# Patient Record
Sex: Female | Born: 1940
Health system: Southern US, Community
[De-identification: ages and names within clinical notes are randomized; demographics above are authoritative.]

## PROBLEM LIST (undated history)

## (undated) DIAGNOSIS — R911 Solitary pulmonary nodule: Secondary | ICD-10-CM

## (undated) DIAGNOSIS — M6520 Calcific tendinitis, unspecified site: Secondary | ICD-10-CM

## (undated) DIAGNOSIS — Z950 Presence of cardiac pacemaker: Secondary | ICD-10-CM

## (undated) DIAGNOSIS — Z95810 Presence of automatic (implantable) cardiac defibrillator: Secondary | ICD-10-CM

## (undated) DIAGNOSIS — I493 Ventricular premature depolarization: Secondary | ICD-10-CM

## (undated) DIAGNOSIS — I10 Essential (primary) hypertension: Secondary | ICD-10-CM

## (undated) DIAGNOSIS — I429 Cardiomyopathy, unspecified: Secondary | ICD-10-CM

## (undated) DIAGNOSIS — K449 Diaphragmatic hernia without obstruction or gangrene: Secondary | ICD-10-CM

## (undated) DIAGNOSIS — E785 Hyperlipidemia, unspecified: Secondary | ICD-10-CM

## (undated) DIAGNOSIS — M722 Plantar fascial fibromatosis: Secondary | ICD-10-CM

## (undated) DIAGNOSIS — E669 Obesity, unspecified: Secondary | ICD-10-CM

## (undated) DIAGNOSIS — G5600 Carpal tunnel syndrome, unspecified upper limb: Secondary | ICD-10-CM

## (undated) DIAGNOSIS — I5042 Chronic combined systolic (congestive) and diastolic (congestive) heart failure: Secondary | ICD-10-CM

## (undated) DIAGNOSIS — I2510 Atherosclerotic heart disease of native coronary artery without angina pectoris: Secondary | ICD-10-CM

## (undated) DIAGNOSIS — Q809 Congenital ichthyosis, unspecified: Secondary | ICD-10-CM

## (undated) DIAGNOSIS — I499 Cardiac arrhythmia, unspecified: Secondary | ICD-10-CM

## (undated) DIAGNOSIS — Z973 Presence of spectacles and contact lenses: Secondary | ICD-10-CM

## (undated) DIAGNOSIS — G4730 Sleep apnea, unspecified: Secondary | ICD-10-CM

## (undated) HISTORY — DX: Diaphragmatic hernia without obstruction or gangrene: K44.9

## (undated) HISTORY — DX: Chronic combined systolic (congestive) and diastolic (congestive) heart failure: I50.42

## (undated) HISTORY — DX: Ventricular premature depolarization: I49.3

## (undated) HISTORY — DX: Congenital ichthyosis, unspecified: Q80.9

## (undated) HISTORY — DX: Hyperlipidemia, unspecified: E78.5

## (undated) HISTORY — DX: Atherosclerotic heart disease of native coronary artery without angina pectoris: I25.10

## (undated) HISTORY — DX: Solitary pulmonary nodule: R91.1

## (undated) HISTORY — DX: Calcific tendinitis, unspecified site: M65.20

## (undated) HISTORY — PX: COLONOSCOPY: SHX174

## (undated) HISTORY — DX: Plantar fascial fibromatosis: M72.2

## (undated) HISTORY — PX: ROTATOR CUFF REPAIR: SHX139

## (undated) HISTORY — DX: Essential (primary) hypertension: I10

## (undated) HISTORY — DX: Carpal tunnel syndrome, unspecified upper limb: G56.00

## (undated) HISTORY — DX: Obesity, unspecified: E66.9

## (undated) HISTORY — PX: TRANSTHORACIC ECHOCARDIOGRAM: SHX275

---

## 1966-12-12 HISTORY — PX: OTHER SURGICAL HISTORY: SHX169

## 1970-12-12 HISTORY — PX: ABSCESS DRAINAGE: SHX1119

## 1973-12-12 HISTORY — PX: ABDOMINAL HYSTERECTOMY: SHX81

## 1999-12-02 ENCOUNTER — Other Ambulatory Visit: Admission: RE | Admit: 1999-12-02 | Discharge: 1999-12-02 | Payer: Self-pay | Admitting: Obstetrics and Gynecology

## 2000-04-26 ENCOUNTER — Encounter: Payer: Self-pay | Admitting: Family Medicine

## 2000-04-26 ENCOUNTER — Encounter: Admission: RE | Admit: 2000-04-26 | Discharge: 2000-04-26 | Payer: Self-pay | Admitting: Family Medicine

## 2000-12-01 ENCOUNTER — Other Ambulatory Visit: Admission: RE | Admit: 2000-12-01 | Discharge: 2000-12-01 | Payer: Self-pay | Admitting: Obstetrics and Gynecology

## 2001-05-07 ENCOUNTER — Encounter: Payer: Self-pay | Admitting: Family Medicine

## 2001-05-07 ENCOUNTER — Encounter: Admission: RE | Admit: 2001-05-07 | Discharge: 2001-05-07 | Payer: Self-pay | Admitting: Family Medicine

## 2002-01-08 ENCOUNTER — Other Ambulatory Visit: Admission: RE | Admit: 2002-01-08 | Discharge: 2002-01-08 | Payer: Self-pay | Admitting: Obstetrics and Gynecology

## 2002-06-07 ENCOUNTER — Encounter: Payer: Self-pay | Admitting: Family Medicine

## 2002-06-07 ENCOUNTER — Encounter: Admission: RE | Admit: 2002-06-07 | Discharge: 2002-06-07 | Payer: Self-pay | Admitting: Family Medicine

## 2003-06-20 ENCOUNTER — Ambulatory Visit (HOSPITAL_COMMUNITY): Admission: RE | Admit: 2003-06-20 | Discharge: 2003-06-20 | Payer: Self-pay | Admitting: *Deleted

## 2003-07-30 ENCOUNTER — Encounter: Admission: RE | Admit: 2003-07-30 | Discharge: 2003-07-30 | Payer: Self-pay | Admitting: Family Medicine

## 2003-07-30 ENCOUNTER — Encounter: Payer: Self-pay | Admitting: Family Medicine

## 2004-04-27 ENCOUNTER — Other Ambulatory Visit: Admission: RE | Admit: 2004-04-27 | Discharge: 2004-04-27 | Payer: Self-pay | Admitting: Family Medicine

## 2004-06-08 ENCOUNTER — Encounter: Admission: RE | Admit: 2004-06-08 | Discharge: 2004-09-06 | Payer: Self-pay | Admitting: Family Medicine

## 2004-10-01 ENCOUNTER — Encounter: Admission: RE | Admit: 2004-10-01 | Discharge: 2004-10-01 | Payer: Self-pay | Admitting: Family Medicine

## 2004-11-02 ENCOUNTER — Encounter: Admission: RE | Admit: 2004-11-02 | Discharge: 2004-11-02 | Payer: Self-pay | Admitting: Family Medicine

## 2005-05-04 ENCOUNTER — Emergency Department (HOSPITAL_COMMUNITY): Admission: EM | Admit: 2005-05-04 | Discharge: 2005-05-04 | Payer: Self-pay | Admitting: Emergency Medicine

## 2005-05-26 ENCOUNTER — Other Ambulatory Visit: Admission: RE | Admit: 2005-05-26 | Discharge: 2005-05-26 | Payer: Self-pay | Admitting: Family Medicine

## 2005-11-10 ENCOUNTER — Encounter: Admission: RE | Admit: 2005-11-10 | Discharge: 2005-11-10 | Payer: Self-pay | Admitting: Family Medicine

## 2006-06-27 ENCOUNTER — Encounter: Admission: RE | Admit: 2006-06-27 | Discharge: 2006-08-03 | Payer: Self-pay | Admitting: Family Medicine

## 2006-11-13 ENCOUNTER — Encounter: Admission: RE | Admit: 2006-11-13 | Discharge: 2006-11-13 | Payer: Self-pay | Admitting: Family Medicine

## 2006-12-12 DIAGNOSIS — M6520 Calcific tendinitis, unspecified site: Secondary | ICD-10-CM

## 2006-12-12 HISTORY — DX: Calcific tendinitis, unspecified site: M65.20

## 2006-12-14 ENCOUNTER — Ambulatory Visit (HOSPITAL_BASED_OUTPATIENT_CLINIC_OR_DEPARTMENT_OTHER): Admission: RE | Admit: 2006-12-14 | Discharge: 2006-12-15 | Payer: Self-pay | Admitting: Orthopedic Surgery

## 2007-03-09 ENCOUNTER — Encounter: Payer: Self-pay | Admitting: Family Medicine

## 2007-03-09 LAB — CONVERTED CEMR LAB
AST: 23 units/L
Alkaline Phosphatase: 63 units/L
BUN: 16 mg/dL
GFR calc Af Amer: 83.72 mL/min
GFR calc non Af Amer: 101.3 mL/min
Glucose, Bld: 100 mg/dL
Total Protein: 6.8 g/dL

## 2007-06-14 ENCOUNTER — Encounter: Payer: Self-pay | Admitting: Family Medicine

## 2007-06-14 ENCOUNTER — Other Ambulatory Visit: Admission: RE | Admit: 2007-06-14 | Discharge: 2007-06-14 | Payer: Self-pay | Admitting: Family Medicine

## 2007-06-14 LAB — CONVERTED CEMR LAB
AST: 28 units/L
Alkaline Phosphatase: 62 units/L
BUN: 8 mg/dL
Basophils Absolute: 0.1 10*3/uL
Creatinine, Ser: 0.7 mg/dL
Eosinophils Absolute: 0.2 10*3/uL
Glucose, Bld: 85 mg/dL
HCT: 41.7 %
HDL: 50 mg/dL
Hemoglobin: 14.1 g/dL
Hgb A1c MFr Bld: 5.7 %
Lymphocytes Relative: 32.5 %
Lymphs Abs: 2.5 10*3/uL
Neutrophils Relative %: 55.5 %
Platelets: 400 10*3/uL
RDW: 13.6 %
Total Bilirubin: 0.7 mg/dL
Total CHOL/HDL Ratio: 2.28
WBC: 7.7 10*3/uL

## 2007-09-10 ENCOUNTER — Encounter: Payer: Self-pay | Admitting: Family Medicine

## 2007-09-10 LAB — CONVERTED CEMR LAB
ALT: 18 units/L
AST: 22 units/L
Albumin: 3.5 g/dL
Alkaline Phosphatase: 54 units/L
BUN: 11 mg/dL
GFR calc non Af Amer: 86.57 mL/min
Total Bilirubin: 0.7 mg/dL
Total Protein: 6.2 g/dL

## 2007-12-04 ENCOUNTER — Encounter: Admission: RE | Admit: 2007-12-04 | Discharge: 2007-12-04 | Payer: Self-pay | Admitting: Family Medicine

## 2007-12-17 ENCOUNTER — Encounter: Payer: Self-pay | Admitting: Family Medicine

## 2007-12-17 LAB — CONVERTED CEMR LAB
Albumin: 3.6 g/dL
Alkaline Phosphatase: 60 units/L
BUN: 10 mg/dL
CO2: 28 meq/L
Chloride: 104 meq/L
GFR calc Af Amer: 83.47 mL/min
Glucose, Bld: 93 mg/dL
Hgb A1c MFr Bld: 5.8 %
Potassium: 5.8 meq/L

## 2008-03-19 ENCOUNTER — Encounter: Payer: Self-pay | Admitting: Family Medicine

## 2008-03-19 LAB — CONVERTED CEMR LAB
AST: 28 units/L
Anion Gap: 12.4
BUN: 11 mg/dL
Chloride: 105 meq/L
Creatinine, Ser: 0.8 mg/dL
GFR calc non Af Amer: 86.57 mL/min
Sodium: 143 meq/L
Total Bilirubin: 0.8 mg/dL

## 2008-07-24 ENCOUNTER — Encounter: Payer: Self-pay | Admitting: Family Medicine

## 2008-07-24 LAB — CONVERTED CEMR LAB
Albumin: 3.8 g/dL
Basophils Absolute: 0.1 10*3/uL
CO2: 29 meq/L
Cholesterol: 118 mg/dL
Creatinine, Ser: 0.8 mg/dL
Eosinophils Absolute: 0.2 10*3/uL
Eosinophils Relative: 3.7 %
Glucose, Bld: 104 mg/dL
HCT: 41.3 %
Hemoglobin: 13.9 g/dL
Lymphs Abs: 1.8 10*3/uL
MCHC: 33.7 g/dL
Monocytes Absolute: 0.4 10*3/uL
RBC: 4.55 M/uL
Sodium: 143 meq/L
TSH: 0.31 microintl units/mL
Total CHOL/HDL Ratio: 2.51
Total Protein: 7.1 g/dL
WBC: 4.6 10*3/uL

## 2008-10-30 ENCOUNTER — Encounter: Payer: Self-pay | Admitting: Family Medicine

## 2008-10-30 LAB — CONVERTED CEMR LAB
ALT: 15 units/L
Albumin: 4.1 g/dL
Alkaline Phosphatase: 62 units/L
BUN: 12 mg/dL
Calcium: 10.1 mg/dL
Hemoglobin: 13.5 g/dL

## 2008-12-03 ENCOUNTER — Encounter: Payer: Self-pay | Admitting: Family Medicine

## 2008-12-03 ENCOUNTER — Emergency Department (HOSPITAL_COMMUNITY): Admission: EM | Admit: 2008-12-03 | Discharge: 2008-12-03 | Payer: Self-pay | Admitting: Emergency Medicine

## 2008-12-03 LAB — CONVERTED CEMR LAB
CO2: 27 meq/L
Eosinophils Absolute: 0.2 10*3/uL
Eosinophils Relative: 3 %
HCT: 40.2 %
Hemoglobin: 13.4 g/dL
Lymphocytes Relative: 34 %
Lymphs Abs: 2 10*3/uL
MCHC: 33.4 g/dL
Monocytes Relative: 6 %
Platelets: 399 10*3/uL
RBC: 4.54 M/uL
RDW: 14.7 %
Sodium: 143 meq/L
WBC: 5.9 10*3/uL

## 2008-12-26 ENCOUNTER — Encounter: Admission: RE | Admit: 2008-12-26 | Discharge: 2008-12-26 | Payer: Self-pay | Admitting: Family Medicine

## 2009-01-14 ENCOUNTER — Encounter (INDEPENDENT_AMBULATORY_CARE_PROVIDER_SITE_OTHER): Payer: Self-pay | Admitting: Family Medicine

## 2009-01-14 ENCOUNTER — Encounter: Admission: RE | Admit: 2009-01-14 | Discharge: 2009-01-14 | Payer: Self-pay | Admitting: Family Medicine

## 2009-01-14 HISTORY — PX: BREAST BIOPSY: SHX20

## 2009-02-06 ENCOUNTER — Encounter: Payer: Self-pay | Admitting: Family Medicine

## 2009-02-06 LAB — CONVERTED CEMR LAB
AST: 23 units/L
Albumin: 3.9 g/dL
Alkaline Phosphatase: 63 units/L
CO2: 31 meq/L
Calcium: 10 mg/dL
Cholesterol: 110 mg/dL
Creatinine, Ser: 0.9 mg/dL
Direct LDL: 49 mg/dL
GFR calc non Af Amer: 62.27 mL/min
Sodium: 140 meq/L
Total Bilirubin: 0.5 mg/dL
Total Protein: 6.7 g/dL
Triglycerides: 58 mg/dL

## 2009-08-10 ENCOUNTER — Encounter: Payer: Self-pay | Admitting: Family Medicine

## 2009-08-10 LAB — CONVERTED CEMR LAB
ALT: 22 units/L
AST: 27 units/L
BUN: 10 mg/dL
Chloride: 105 meq/L
Cholesterol: 116 mg/dL
GFR calc Af Amer: 75.34 mL/min
GFR calc non Af Amer: 62.27 mL/min
Glucose, Bld: 82 mg/dL
HCT: 41.7 %
HDL: 45 mg/dL
Hemoglobin: 14 g/dL
Hgb A1c MFr Bld: 6 %
Lymphocytes Relative: 40.2 %
Monocytes Relative: 7.4 %
Neutro Abs: 2.6 10*3/uL
Neutrophils Relative %: 48.7 %
Potassium: 5.3 meq/L
RDW: 13.5 %
Sodium: 143 meq/L
TSH: 0.51 microintl units/mL
WBC: 5.3 10*3/uL

## 2009-09-01 ENCOUNTER — Encounter: Payer: Self-pay | Admitting: Family Medicine

## 2009-09-01 ENCOUNTER — Encounter: Admission: RE | Admit: 2009-09-01 | Discharge: 2009-09-01 | Payer: Self-pay | Admitting: Family Medicine

## 2009-11-20 ENCOUNTER — Encounter: Payer: Self-pay | Admitting: Family Medicine

## 2009-11-20 LAB — CONVERTED CEMR LAB
ALT: 19 units/L
Alkaline Phosphatase: 65 units/L
Anion Gap: 12.2
BUN: 21 mg/dL
CO2: 29 meq/L
Calcium: 10.5 mg/dL
GFR calc Af Amer: 86.05 mL/min
Glucose, Bld: 110 mg/dL

## 2010-02-09 ENCOUNTER — Encounter: Payer: Self-pay | Admitting: Family Medicine

## 2010-02-09 LAB — CONVERTED CEMR LAB
AST: 19 units/L
Albumin: 3.8 g/dL
Alkaline Phosphatase: 57 units/L
Chloride: 104 meq/L
Cholesterol: 201 mg/dL
GFR calc Af Amer: 86.05 mL/min
GFR calc non Af Amer: 71.12 mL/min
Hgb A1c MFr Bld: 6.3 %
LDL Cholesterol: 118 mg/dL
Total Bilirubin: 0.5 mg/dL
Total CHOL/HDL Ratio: 4.37
Triglycerides: 101 mg/dL

## 2010-05-19 DIAGNOSIS — Q809 Congenital ichthyosis, unspecified: Secondary | ICD-10-CM | POA: Insufficient documentation

## 2010-05-19 DIAGNOSIS — K449 Diaphragmatic hernia without obstruction or gangrene: Secondary | ICD-10-CM | POA: Insufficient documentation

## 2010-05-19 DIAGNOSIS — E119 Type 2 diabetes mellitus without complications: Secondary | ICD-10-CM

## 2010-05-19 DIAGNOSIS — Z6830 Body mass index (BMI) 30.0-30.9, adult: Secondary | ICD-10-CM | POA: Insufficient documentation

## 2010-05-19 DIAGNOSIS — E785 Hyperlipidemia, unspecified: Secondary | ICD-10-CM

## 2010-05-19 DIAGNOSIS — Q447 Other congenital malformations of liver: Secondary | ICD-10-CM

## 2010-05-19 DIAGNOSIS — Q441 Other congenital malformations of gallbladder: Secondary | ICD-10-CM | POA: Insufficient documentation

## 2010-05-19 DIAGNOSIS — Q445 Other congenital malformations of bile ducts: Secondary | ICD-10-CM

## 2010-05-19 DIAGNOSIS — E1151 Type 2 diabetes mellitus with diabetic peripheral angiopathy without gangrene: Secondary | ICD-10-CM | POA: Insufficient documentation

## 2010-05-19 DIAGNOSIS — E1169 Type 2 diabetes mellitus with other specified complication: Secondary | ICD-10-CM | POA: Insufficient documentation

## 2010-05-19 DIAGNOSIS — J438 Other emphysema: Secondary | ICD-10-CM

## 2010-05-19 DIAGNOSIS — Q4479 Other congenital malformations of liver: Secondary | ICD-10-CM | POA: Insufficient documentation

## 2010-05-19 DIAGNOSIS — J209 Acute bronchitis, unspecified: Secondary | ICD-10-CM | POA: Insufficient documentation

## 2010-05-19 DIAGNOSIS — I10 Essential (primary) hypertension: Secondary | ICD-10-CM

## 2010-05-25 ENCOUNTER — Encounter: Payer: Self-pay | Admitting: Family Medicine

## 2010-06-07 DIAGNOSIS — J984 Other disorders of lung: Secondary | ICD-10-CM

## 2010-06-22 ENCOUNTER — Ambulatory Visit: Payer: Self-pay | Admitting: Family Medicine

## 2010-06-23 LAB — CONVERTED CEMR LAB
AST: 19 units/L (ref 0–37)
BUN: 11 mg/dL (ref 6–23)
Bilirubin, Direct: 0.1 mg/dL (ref 0.0–0.3)
CO2: 29 meq/L (ref 19–32)
Calcium: 9.6 mg/dL (ref 8.4–10.5)
Creatinine, Ser: 0.7 mg/dL (ref 0.4–1.2)
GFR calc non Af Amer: 99.83 mL/min (ref >60)
Glucose, Bld: 88 mg/dL (ref 70–99)
HDL: 40.8 mg/dL (ref >39.00)
Hgb A1c MFr Bld: 6 % (ref 4.6–6.5)
Total Bilirubin: 0.6 mg/dL (ref 0.3–1.2)
Total Protein: 6.5 g/dL (ref 6.0–8.3)

## 2010-07-01 ENCOUNTER — Telehealth: Payer: Self-pay | Admitting: Family Medicine

## 2010-09-07 ENCOUNTER — Telehealth: Payer: Self-pay | Admitting: Family Medicine

## 2010-10-06 ENCOUNTER — Ambulatory Visit: Payer: Self-pay | Admitting: Family Medicine

## 2010-10-08 ENCOUNTER — Ambulatory Visit: Payer: Self-pay | Admitting: Family Medicine

## 2010-10-14 ENCOUNTER — Encounter (INDEPENDENT_AMBULATORY_CARE_PROVIDER_SITE_OTHER): Payer: Self-pay | Admitting: *Deleted

## 2010-10-18 ENCOUNTER — Encounter: Payer: Self-pay | Admitting: Family Medicine

## 2010-10-18 LAB — CONVERTED CEMR LAB
Cholesterol: 197 mg/dL
HDL: 48.7 mg/dL
Hgb A1c MFr Bld: 6.1 %
LDL Cholesterol: 133 mg/dL — ABNORMAL HIGH
Total CHOL/HDL Ratio: 4
Triglycerides: 76 mg/dL
VLDL: 15.2 mg/dL

## 2010-10-19 ENCOUNTER — Telehealth: Payer: Self-pay | Admitting: Family Medicine

## 2010-10-21 ENCOUNTER — Telehealth: Payer: Self-pay | Admitting: Family Medicine

## 2010-10-25 ENCOUNTER — Encounter: Payer: Self-pay | Admitting: Family Medicine

## 2010-10-25 ENCOUNTER — Telehealth: Payer: Self-pay | Admitting: Family Medicine

## 2010-11-08 ENCOUNTER — Telehealth: Payer: Self-pay | Admitting: Family Medicine

## 2010-11-09 ENCOUNTER — Ambulatory Visit: Payer: Self-pay | Admitting: Family Medicine

## 2010-11-09 DIAGNOSIS — M25569 Pain in unspecified knee: Secondary | ICD-10-CM | POA: Insufficient documentation

## 2011-01-13 NOTE — Assessment & Plan Note (Signed)
Summary: NEW FROM [facility]   Vital Signs:  Patient profile:   70 year old female Height:      63 inches Weight:      186.50 pounds BMI:     33.16 Temp:     98.6 degrees F oral Pulse rate:   84 / minute Pulse rhythm:   regular BP sitting:   136 / 84  (left arm) Cuff size:   regular  Vitals Entered By: Delilah Shan CMA  Dull) (June 22, 2010 9:45 AM) CC: Transfer from [facility]   History of Present Illness: Icythosis- Asking about going see derm.  Would like to see Dr. Terri Piedra.  Now with skin changes on the face.  No skin breakdown.  Patient has been losing weight with meal plan.  Now off antacids.  Has lost about  ~28 lbs.  Sleep is improved.    HLD- Intolerant of statins.  Exercising and dieting.    Hypertension:      Using medication without problems or lightheadedness: yes Chest pain with exertion:no Edema:no Short of breath:no Average home BPs:no Other issues: no  Diabetes:  Using medications without difficulties: yes Hypoglycemic episodes: no symptoms Hyperglycemic episodes:no symptoms  Feet problems: no Blood Sugars averaging: not checking a lot, but when checked in AM it was  ~90s eye exam within last year: yes, April of 2011  Due for labs.   Allergies: 1)  ! Sulfa 2)  ! Vytorin (Ezetimibe-Simvastatin) 3)  ! Pravachol  Past History:  Social History: Last updated: 05/19/2010 Marital Status: Married, husband is well Children: Son uses CPAP machine, daughter is well.   Two grandchildren are well. 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  Otherwise noncontributory.    Physical Exam  General:  GEN: nad, alert and oriented HEENT: mucous membranes moist NECK: supple w/o LA CV: rrr.  no murmur PULM: ctab, no inc wob ABD: soft, +bs EXT: no edema SKIN: no acute rash but hyperpigmentation and thickening noted diffusely.  It has increase on the upper neck and the face.   Diabetes Management Exam:    Foot Exam (with socks and/or shoes not present):       Sensory-Pinprick/Light touch:          Left medial foot (L-4): normal          Left dorsal foot (L-5): normal          Left lateral foot (S-1): normal          Right medial foot (L-4): normal          Right dorsal foot (L-5): normal          Right lateral foot (S-1): normal       Sensory-Monofilament:          Left foot: normal          Right foot: normal       Inspection:          Left foot: normal          Right foot: normal       Nails:  Left foot: thickened          Right foot: thickened    Eye Exam:       Eye Exam done elsewhere   Impression & Recommendations:  Problem # 1:  ICHTHYOSIS CONGENITA (ICD-757.1) Refer to derm for consideration.  I am unsure what can be done for patient but would like input.  she appreciates derm input.  Orders: Dermatology Referral (Derma)  Problem # 2:  HYPERLIPIDEMIA (ICD-272.4) Intolerant of statins.  check labs and continue lifestyle interventions.  I am happy that she is losing weight with diet and exercise.  The following medications were removed from the medication list:    Vytorin 10-40 Mg Tabs (Ezetimibe-simvastatin) .Marland Kitchen... Take 1 tablet by mouth once a day  Problem # 3:  HYPERTENSION (ICD-401.9) No change in meds today.  contact with labs.  Her updated medication list for this problem includes:    Amlodipine Besylate 5 Mg Tabs (Amlodipine besylate) .Marland Kitchen... Take 1 tablet by mouth once a day    Enalapril Maleate 20 Mg Tabs (Enalapril maleate)  .Marland Kitchen... Take 1 tablet by mouth once a day  Problem # 4:  DIABETES MELLITUS, TYPE II (ICD-250.00) Contact with labs and will notify patient.  Continue current meds for now.  Her updated medication list for this problem includes:    Actos 15 Mg Tabs (Pioglitazone hcl) .Marland Kitchen... Take 1 tablet by mouth once a day    Enalapril Maleate 20 Mg Tabs (Enalapril maleate) .Marland Kitchen... Take 1 tablet by mouth once a day    Aspirin 81 Mg Tabs (Aspirin) .Marland Kitchen... Take 1 tablet by mouth once a day  Orders: TLB-BMP (Basic Metabolic Panel-BMET) (80048-METABOL) TLB-Hepatic/Liver Function Pnl (80076-HEPATIC) TLB-Lipid Panel (80061-LIPID) TLB-A1C / Hgb A1C (Glycohemoglobin) (83036-A1C)  Complete Medication List: 1)  Actos 15 Mg Tabs (Pioglitazone hcl) .... Take 1 tablet by mouth once a day 2)  Amlodipine Besylate 5 Mg Tabs (Amlodipine besylate) .... Take 1 tablet by mouth once a day 3)  Enalapril Maleate 20 Mg Tabs (Enalapril maleate) .... Take 1 tablet by mouth once a day 4)  Aspirin 81 Mg Tabs (Aspirin) .... Take 1 tablet by mouth once a day 5)  Accu-chek Soft Touch Lancets Misc (Lancets) .... Test blood sugar once daily 6)  Vitamin D 1000 Unit Tabs (Cholecalciferol) .... 2,000 international units daily 7)  Accu-chek Aviva Strp (Glucose blood) .... Test blood sugar once daily 8)  Catalyn (mtv)  .... Take 1 tablet by mouth once a day 9)  Whey Protein Isolate Powd (Whey protein) .... Once daily 10)  Gastro Fiber  .... (shake) once daily 11)  Fish Oil Oil (Fish oil) .... (tuna)  two times a day 12)  Vitamin B-12 500 Mcg Tabs (Cyanocobalamin) .... Take 1 tablet by mouth three times a day  Patient Instructions: 1)  Please schedule a follow-up appointment in 3 months .  See Shirlee Limerick about your referral before your leave today.  I'll contact you with the labs.   Current Allergies (reviewed today): ! SULFA ! VYTORIN (EZETIMIBE-SIMVASTATIN) ! PRAVACHOL

## 2011-01-13 NOTE — Progress Notes (Signed)
Summary: mineral oil  Phone Note Call from Patient Call back at [phone]   Caller: Patient Call For: Crawford Givens MD Summary of Call: Patient is asking if she could use mineral oil on her skin on a regular basis. She says that it really helps with her dry skin. The only concern she has it that she read on the internet that it will cause problems with your liver if used regularly. Please advise.  Initial call taken by: Melody Comas,  July 01, 2010 10:21 AM  Follow-up for Phone Call        I think this would be fine to use on the skin.  I cannot imagine enough absorption to cause any internal trouble.  Follow-up by: Crawford Givens MD,  July 01, 2010 10:55 AM  Additional Follow-up for Phone Call Additional follow up Details #1::        Patient advised.  Additional Follow-up by: Melody Comas,  July 01, 2010 1:15 PM

## 2011-01-13 NOTE — Progress Notes (Signed)
Summary: regarding chol  Phone Note Call from Patient Call back at Home Phone [phone]   Caller: Patient Call For: Crawford Givens MD Summary of Call: Patient called regarding her cholesterol. She says that she doesn't want you to think that she is trying to go against what you feel she should do by not taking chol med and trying to work on diet. I eplained to patient that you ddn't feel that way snd that it is perfectly normal to want to work on chol without medication. She also wanted to know that she is not going to harm herselft by not starting the med right away. I explained to her that, we wiil be rechecking her chol in 3 months and that if you felt it was going to be dangerous in anyway that you wouldn't have agreed to her trying to lower w/ diet and excersise before starting meds. After the conversation patient said that she would really like to talk with you at your convenience. She can be reached at her home number.  Initial call taken by: Melody Comas,  October 21, 2010 4:35 PM  Follow-up for Phone Call        I d/w patient.  It is reasonable to work on diet and exercise and recheck lipids in 2/12.  consider MWF statin at that point if elevated.  She agrees.  Follow-up by: Crawford Givens MD,  October 21, 2010 4:48 PM    New/Updated Medications: PRAVASTATIN SODIUM 20 MG TABS (PRAVASTATIN SODIUM) Take 1 tablet by mouth once daily on MWF.  If  muscle aches return , then decrease to 1/2 tab (10mg ) on MWF. not started as of 10/2010

## 2011-01-13 NOTE — Letter (Signed)
Summary: Generic Letter  [place] at [facility]  [address] [place], [place] [address]   Phone: [phone]  Fax: [phone]    10/25/2010  Natasha Woodard [address] [place], [place]  [address]  To whom it may concern,  Natasha Woodard needs to continue to be on Actos for diabetes. Her membership number is [id number].  Her date of birth is [birth year].   This letter needs to go to: [facility], [address], [place], [place] [address]., Attention: Tedd Sias PDP.    Sincerely,   Crawford Givens MD

## 2011-01-13 NOTE — Letter (Signed)
Summary: Generic Letter  [place] at [facility]  [address] [place], [place] [address]   Phone: [phone]  Fax: [phone]    10/14/2010    Natasha Woodard [address] [place], [place]  [address]    Dear [name],  We have been unable to reach you by phone.  If your phone number has changed, please notify our office as it is important that we be able to contact  you if necessary.   Please call in to the office [phone] and ask to speak to Lugene at [address].  I have some information that I need to share with you concerning your cholesterol.   Sincerely,   Lugene Fuquay CMA (AAMA)

## 2011-01-13 NOTE — Miscellaneous (Signed)
  Clinical Lists Changes  Medications: Added new medication of PRAVASTATIN SODIUM 20 MG TABS (PRAVASTATIN SODIUM) Take 1 tablet by mouth once daily on MWF.  If  muscle aches return , then decrease to 1/2 tab (10mg ) on MWF. - Signed Rx of PRAVASTATIN SODIUM 20 MG TABS (PRAVASTATIN SODIUM) Take 1 tablet by mouth once daily on MWF.  If  muscle aches return , then decrease to 1/2 tab (10mg ) on MWF.;  #30 x 3;  Signed;  Entered by: Delilah Shan CMA (AAMA);  Authorized by: Crawford Givens MD;  Method used: Electronically to [facility] [address] [place], [place], [place]  [address], Ph: [phone], Fax: [phone]    Prescriptions: PRAVASTATIN SODIUM 20 MG TABS (PRAVASTATIN SODIUM) Take 1 tablet by mouth once daily on MWF.  If  muscle aches return , then decrease to 1/2 tab (10mg ) on MWF.  #30 x 3   Entered by:   Delilah Shan CMA (AAMA)   Authorized by:   Crawford Givens MD   Signed by:   Delilah Shan CMA (AAMA) on 10/18/2010   Method used:   Electronically to        [facility] (retail)       [address]       [place], [place]  [address]       Ph: [phone]       Fax: [phone]   RxID:   [id number]

## 2011-01-13 NOTE — Progress Notes (Signed)
Summary: knee pain   Phone Note Call from Patient Call back at Home Phone [phone]   Caller: Patient Call For: Crawford Givens MD Summary of Call: Patient states that she has been having some issues with her knee being sore and hurting when she walks. She says that there was no know injury, it just started a coupld of weeks ago. Patient says that she doesn't know how to describe the pain. I offered her an appt with you for tomorrow, but she refused it because she says that she feels like she may need to see a specialist and she doesn't want to waste her time withe several different appts before she gets the probelm taken care of. She is asking if you could do referral. Please advise.  Initial call taken by: Melody Comas,  November 08, 2010 11:08 AM  Follow-up for Phone Call        I need to see her before I would know where to refer her (or if she needs referral). Follow-up by: Crawford Givens MD,  November 08, 2010 11:11 AM  Additional Follow-up for Phone Call Additional follow up Details #1::        Patient Advised.   Appointment scheduled  11/09/2010 at 4 p.m. Additional Follow-up by: Delilah Shan CMA Duncan Dull),  November 08, 2010 11:29 AM

## 2011-01-13 NOTE — Progress Notes (Signed)
Summary: Letter regarding Actos  Phone Note Call from Patient Call back at [phone]   Caller: Patient Call For: Crawford Givens MD Summary of Call: Patient is calling to request that you put in writing that she needs to continue to be on Actos and the mgs.. The letter needs to have her membership number on it ([phone]), name and date of birth. This needs to go to: [facility], [address] [place], [place] [address]., Attention: Tedd Sias PDP. This needs to be sent to them within the next 10 days. Let patient know when this has been done. Initial call taken by: Sydell Axon LPN,  October 25, 2010 1:51 PM  Follow-up for Phone Call        letter done.  please send in and notify patient.  Follow-up by: Crawford Givens MD,  October 25, 2010 1:56 PM  Additional Follow-up for Phone Call Additional follow up Details #1::        Letter mailed.  Patient Advised.  Additional Follow-up by: Delilah Shan CMA Duncan Dull),  October 25, 2010 2:36 PM

## 2011-01-13 NOTE — Assessment & Plan Note (Signed)
Summary: Knee pain, ? referral /lsf   Vital Signs:  Patient profile:   70 year old female Height:      63 inches Weight:      197.25 pounds BMI:     35.07 Temp:     98.5 degrees F oral Pulse rate:   84 / minute Pulse rhythm:   regular BP sitting:   130 / 76  (left arm) Cuff size:   large  Vitals Entered By: Delilah Shan CMA Duncan Dull) (November 09, 2010 4:02 PM) CC: Knee pain, ? referral   History of Present Illness: L knee pain started last week.  "It wouldn't move (due to pain she couldn't bend it) and I had to drag it around (pain with any weight bearing)."  Getting some better today.  No pop or snap felt or heard. No trauma. No grinding sensation.  No pain in R knee.  She wants to walk, had been walking  ~2 miles a day.  No hip or foot pain.   Allergies: 1)  ! Sulfa 2)  ! Vytorin (Ezetimibe-Simvastatin) 3)  ! Pravachol  Review of Systems       See HPI.  Otherwise negative.    Physical Exam  General:  GEN: nad, alert and oriented SKIN: no acute rash but hyperpigmentation and thickening noted diffusely.  L knee with no edema or bruising.  Normal range of motion but patellar crepitus noted.  not tender to palpation on medial/lateral joint line.  No meniscal click.  ACL/LCL/MCL stabe on testing.    Impression & Recommendations:  Problem # 1:  KNEE PAIN, LEFT (ICD-719.46) Likely OA flare that is resolving.  She likely has underlying cartilage degeneration, but since she is able to weight bear and the pain is improving I would not image now.  use ibuprofen as needed and gradually increase exercise (0.1-0.2 mile inc at a time).  she understands. Call back as needed.  Her updated medication list for this problem includes:    Aspirin 81 Mg Tabs (Aspirin) .Marland Kitchen... Take 1 tablet by mouth once a day  Complete Medication List: 1)  Actos 15 Mg Tabs (Pioglitazone hcl) .... Take 1 tablet by mouth once a day 2)  Amlodipine Besylate 5 Mg Tabs (Amlodipine besylate) .... Take 1 tablet by  mouth once a day 3)  Enalapril Maleate 20 Mg Tabs (Enalapril maleate) .... Take 1 tablet by mouth once a day 4)  Aspirin 81 Mg Tabs (Aspirin) .... Take 1 tablet by mouth once a day 5)  Accu-chek Soft Touch Lancets Misc (Lancets) .... Test blood sugar once daily 6)  Vitamin D 1000 Unit Tabs (Cholecalciferol) .... 2,000 international units daily 7)  Accu-chek Aviva Strp (Glucose blood) .... Test blood sugar once daily 8)  Catalyn (mtv)  .... Take 1 tablet by mouth once a day 9)  Whey Protein Isolate Powd (Whey protein) .... Once daily 10)  Gastro Fiber  .... (shake) once daily 11)  Fish Oil Oil (Fish oil) .... (tuna)  two times a day 12)  Vitamin B-12 500 Mcg Tabs (Cyanocobalamin) .... Take 1 tablet by mouth three times a day 13)  Pravastatin Sodium 20 Mg Tabs (Pravastatin sodium) .... Take 1 tablet by mouth once daily on mwf.  if  muscle aches return , then decrease to 1/2 tab (10mg ) on mwf. not started as of 10/2010  Patient Instructions: 1)  Keep taking advil/ibuprofen 200mg  by mouth two times a day with food as needed for pain.  Keep doing your  knee exercises and gradually increase your walking.  Wear supportive shoes.  Let me know if you have more trouble.  Take care.    Orders Added: 1)  Est. Patient Level III [63875]    Current Allergies (reviewed today): ! SULFA ! VYTORIN (EZETIMIBE-SIMVASTATIN) ! PRAVACHOL

## 2011-01-13 NOTE — Progress Notes (Signed)
Summary: regarding actos  Phone Note Call from Patient   Caller: Patient Call For: Crawford Givens MD Summary of Call: Pt states she takes 15 mg's of actos daily.  Someone gave her some 30 mg pills and she is asking if ok to take one every other day.  Advised no, but that she can cut the pills and take one half every day to make her 15 mg's daily. Initial call taken by: Lowella Petties CMA,  September 07, 2010 9:27 AM  Follow-up for Phone Call        Agreed.  Follow-up by: Crawford Givens MD,  September 07, 2010 10:55 AM

## 2011-01-13 NOTE — Assessment & Plan Note (Signed)
Summary: 3 M F/U DLO   Vital Signs:  Patient profile:   70 year old female Height:      63 inches Weight:      191.25 pounds BMI:     34.00 Temp:     98.6 degrees F oral Pulse rate:   92 / minute Pulse rhythm:   regular BP sitting:   162 / 94  (left arm) Cuff size:   regular  Vitals Entered By: Delilah Shan CMA Duncan Dull) (October 06, 2010 4:00 PM)  Serial Vital Signs/Assessments:  Time      Position  BP       Pulse  Resp  Temp     By                     130/85                         Crawford Givens MD  CC: 3 months follow up   History of Present Illness: L plantar fasciitis- flared up walking on concrete.  d/w patient re: stretching.  She has been doing this on a step, but not before getting out of bed.  Pain with first step in AM. No trauma.   Diabetes:  Using medications without difficulties: yes Hypoglycemic episodes:no Hyperglycemic episodes:no Feet problems: none except for hypoglycemia Blood Sugars averaging: episodically, 105 in AM a few days ago.  eye exam within last year: yes  due for repeat A1c and lipids.   Hypertension:      Using medication without problems or lightheadedness: yes Chest pain with exertion:no Edema:no Short of breath:no Other issues: no  See plan re:HLD.   Allergies: 1)  ! Sulfa 2)  ! Vytorin (Ezetimibe-Simvastatin) 3)  ! 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  Otherwise negative.    Physical Exam  General:  GEN: nad, alert and oriented HEENT:  mucous membranes moist NECK: supple w/o LA CV: rrr.  no murmur PULM: ctab, no inc wob ABD: soft, +bs EXT: no edema SKIN: no acute rash but hyperpigmentation and thickening noted diffusely.  It has increase on the upper neck and the face.   Diabetes Management Exam:    Foot Exam (with socks and/or shoes not present):       Sensory-Pinprick/Light touch:          Left medial foot (L-4): normal          Left dorsal foot (L-5): normal          Left lateral foot (S-1): normal          Right medial foot (L-4): normal          Right dorsal foot (L-5): normal          Right lateral foot (S-1): normal       Sensory-Monofilament:          Left foot: normal          Right foot: normal       Inspection:          Left foot: normal  Right foot: normal       Nails:          Left foot: thickened          Right foot: thickened   Impression & Recommendations:  Problem # 1:  HYPERTENSION (ICD-401.9) Recheck BP 130/85.  No change in meds.  Her updated medication list for this problem includes:    Amlodipine Besylate 5 Mg Tabs (Amlodipine besylate) .Marland Kitchen... Take 1 tablet by mouth once a day    Enalapril Maleate 20 Mg Tabs (Enalapril maleate) .Marland Kitchen... Take 1 tablet by mouth once a day  Problem # 2:  HYPERLIPIDEMIA (ICD-272.4) Prev intolerant of statins.  She has been working to exercise. Would like to check lipids again and consider MWF dose of pravastatin to see if she can tolerate it.  Return for fasting labs.   Problem # 3:  DIABETES MELLITUS, TYPE II (ICD-250.00) No change in meds now.  D/w patient re: actos.  At this point, there is no contraindication to use.  If A1c dips below 6, then we would consider decrease in meds anyway.  d/w patient and she understood. D/w patient [id number] and stretching foot, esp for plantar pain.  She understood.  Her updated medication list for this problem includes:    Actos 15 Mg Tabs (Pioglitazone hcl) .Marland Kitchen... Take 1 tablet by mouth once a day    Enalapril  Maleate 20 Mg Tabs (Enalapril maleate) .Marland Kitchen... Take 1 tablet by mouth once a day    Aspirin 81 Mg Tabs (Aspirin) .Marland Kitchen... Take 1 tablet by mouth once a day  Complete Medication List: 1)  Actos 15 Mg Tabs (Pioglitazone hcl) .... Take 1 tablet by mouth once a day 2)  Amlodipine Besylate 5 Mg Tabs (Amlodipine besylate) .... Take 1 tablet by mouth once a day 3)  Enalapril Maleate 20 Mg Tabs (Enalapril maleate) .... Take 1 tablet by mouth once a day 4)  Aspirin 81 Mg Tabs (Aspirin) .... Take 1 tablet by mouth once a day 5)  Accu-chek Soft Touch Lancets Misc (Lancets) .... Test blood sugar once daily 6)  Vitamin D 1000 Unit Tabs (Cholecalciferol) .... 2,000 international units daily 7)  Accu-chek Aviva Strp (Glucose blood) .... Test blood sugar once daily 8)  Catalyn (mtv)  .... Take 1 tablet by mouth once a day 9)  Whey Protein Isolate Powd (Whey protein) .... Once daily 10)  Gastro Fiber  .... (shake) once daily 11)  Fish Oil Oil (Fish oil) .... (tuna)  two times a day 12)  Vitamin B-12 500 Mcg Tabs (Cyanocobalamin) .... Take 1 tablet by mouth three times a day  Patient Instructions: 1)  Please come back for fasting labs on Friday.  2)  lipid/A1c---250.00 3)  You can get your results through our phone system.  Follow the instructions on the blue card.  4)  I'll send word about the cholesterol medicine (if you need it). 5)  Work on stretching your left foot before you get out of bed.   6)  Plan on coming back for another visit in 3 months.  30 min appointment.  Take care. Glad to see you today.    Orders Added: 1)  Est. Patient Level IV [09811]    Current Allergies (reviewed today): ! SULFA ! VYTORIN (EZETIMIBE-SIMVASTATIN) ! PRAVACHOL  Appended Document: 3 M F/U DLO    Clinical Lists Changes  Observations: Added new observation of FLU VAX: Historical (08/12/2010 22:59)       Immunization History:  Influenza  Immunization History:    Influenza:  historical (08/12/2010)

## 2011-01-13 NOTE — Progress Notes (Signed)
Summary: Work on [employer] Note Call from Patient Call back at [employer] [phone]   Caller: Patient Call For: Crawford Givens MD Summary of Call: After speaking with the patient and sending in the Rx. for Pravastatin, she phoned back in the late afternoon and said she did not want to start any cholesterol medication.  She would like to work harder on her diet and perhaps add some items to her diet like, fish, almonds, etc. that she has been told will help her cholesterol.  She would like to try that for the next 3 months unless you feel strongly about her beginning cholesterol medication.  She stated that she did not indicate to you that she wanted to start back on the statins.  Is it okay for her to work on diet first?  If you feel that it is imperative that she begin medication, she does not want to go against your suggestions. Initial call taken by: Delilah Shan CMA Duncan Dull),  October 19, 2010 9:29 AM  Follow-up for Phone Call        Okay to work on diet and recheck fasting lipids in 3 months.  Can consider MWF pravastatin at that point.  Follow-up by: Crawford Givens MD,  October 19, 2010 1:45 PM  Additional Follow-up for Phone Call Additional follow up Details #1::        Left detailed message on cell phone advising patient to work on diet and recheck cholesterol in 3 months then reconsider medications. I instructed her to call with any concerns or questions. Additional Follow-up by: Janee Morn CMA Duncan Dull),  October 19, 2010 1:54 PM

## 2011-01-19 ENCOUNTER — Encounter (INDEPENDENT_AMBULATORY_CARE_PROVIDER_SITE_OTHER): Payer: Self-pay | Admitting: *Deleted

## 2011-01-19 ENCOUNTER — Other Ambulatory Visit (INDEPENDENT_AMBULATORY_CARE_PROVIDER_SITE_OTHER): Payer: Medicare Other

## 2011-01-19 ENCOUNTER — Other Ambulatory Visit: Payer: Self-pay | Admitting: Family Medicine

## 2011-01-19 DIAGNOSIS — E119 Type 2 diabetes mellitus without complications: Secondary | ICD-10-CM

## 2011-01-19 DIAGNOSIS — E785 Hyperlipidemia, unspecified: Secondary | ICD-10-CM

## 2011-01-19 LAB — LIPID PANEL
HDL: 40.5 mg/dL (ref >39.00)
Total CHOL/HDL Ratio: 4
VLDL: 23.2 mg/dL (ref 0.0–40.0)

## 2011-01-25 ENCOUNTER — Ambulatory Visit (INDEPENDENT_AMBULATORY_CARE_PROVIDER_SITE_OTHER): Payer: Medicare Other | Admitting: Family Medicine

## 2011-01-25 ENCOUNTER — Encounter: Payer: Self-pay | Admitting: Family Medicine

## 2011-01-25 DIAGNOSIS — J069 Acute upper respiratory infection, unspecified: Secondary | ICD-10-CM | POA: Insufficient documentation

## 2011-01-25 DIAGNOSIS — E785 Hyperlipidemia, unspecified: Secondary | ICD-10-CM

## 2011-01-25 DIAGNOSIS — I10 Essential (primary) hypertension: Secondary | ICD-10-CM

## 2011-01-25 DIAGNOSIS — E119 Type 2 diabetes mellitus without complications: Secondary | ICD-10-CM

## 2011-02-02 NOTE — Assessment & Plan Note (Signed)
Summary: 3 MONTH FOLLOW UP/LSF   Vital Signs:  Patient profile:   70 year old female Height:      63 inches Weight:      190.25 pounds BMI:     33.82 Temp:     98.9 degrees F oral Pulse rate:   84 / minute Pulse rhythm:   regular BP sitting:   130 / 70  (left arm) Cuff size:   large  Vitals Entered By: Delilah Shan CMA  Dull) (January 25, 2011 4:12 PM) CC: 3 months follow up   History of Present Illness: duration of symptoms: several days rhinorrhea:yes congestion:yes ear pain:no sore throat:minimal cough:no myalgias:no other concerns: voice change noted.  no fevers since Sunday, +sick exposures  Diabetes:  Using medications without difficulties:yes Hypoglycemic episodes:no Hyperglycemic episodes:no Feet problems:no Blood Sugars averaging:  ~100 in AM  Hypertension:      Using medication without problems or lightheadedness: yes Chest pain with exertion:no Edema:no Short of breath:no Other issues:no  Elevated Cholesterol: Using medications without problems:not on meds Muscle aches: no Other complaints: no, working on diet and weight.  exercising.   Allergies: 1)  ! Sulfa 2)  ! Vytorin (Ezetimibe-Simvastatin) 3)  ! 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  One died of an MI, one of lung cancer, one of end-stage renal disease.  One brother and all 4 sisters use CPAP for obstructive sleep  apnea Children: Son uses CPAP machine, daughter is well.    Social History: Marital Status: Married, husband is well 2 kids Two grandchildren are well. 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  Otherwise negative.    Physical Exam  General:  GEN: nad, alert and oriented SKIN: no acute rash but hyperpigmentation and thickening noted diffusely.  tm wnl, nasal injection noted, mucous membranes moist w/o eyrthema neck supple regular rate and rhythm clear to auscultation bilaterally ext w/o edema  Diabetes Management Exam:    Foot Exam (with socks and/or shoes not present):       Sensory-Pinprick/Light touch:          Left medial foot (L-4): normal          Left dorsal foot (L-5): normal          Left lateral foot (S-1): normal          Right medial foot (L-4): normal          Right dorsal foot (L-5): normal          Right lateral foot (S-1): normal       Sensory-Monofilament:          Left foot: normal          Right foot: normal       Inspection:          Left foot: normal          Right foot: normal       Nails:          Left foot: normal  Right foot: normal   Impression & Recommendations:  Problem # 1:  HYPERTENSION (ICD-401.9) no change in meds.  controlled.  Her updated medication list for this problem includes:    Amlodipine Besylate 5 Mg Tabs (Amlodipine besylate) .Marland Kitchen... Take 1 tablet by mouth once a day    Enalapril Maleate 20 Mg Tabs (Enalapril maleate) .Marland Kitchen... Take 1 tablet by mouth once a day  Problem # 2:  DIABETES MELLITUS, TYPE II (ICD-250.00) controlled, no change in meds.  d/w patient ZO:XWRUEA/VWUJ and exercise.  Her updated medication list for this problem includes:    Actos 15 Mg Tabs (Pioglitazone hcl) .Marland Kitchen... Take 1 tablet by mouth once a day    Enalapril Maleate 20 Mg Tabs (Enalapril maleate) .Marland Kitchen... Take 1 tablet by mouth once a day    Aspirin 81 Mg Tabs (Aspirin) .Marland Kitchen...  Take 1 tablet by mouth once a day  Problem # 3:  HYPERLIPIDEMIA (ICD-272.4) improved.  off statin.  continue to work on weight.  The following medications were removed from the medication list:    Pravastatin Sodium 20 Mg Tabs (Pravastatin sodium) .Marland Kitchen... Take 1 tablet by mouth once daily on mwf.  if  muscle aches return , then decrease to 1/2 tab (10mg ) on mwf. not started as of 10/2010  Problem # 4:  URI (ICD-465.9) likely viral, nontoxic.  supporitve tx and follow up as needed.  She agrees.  Her updated medication list for this problem includes:    Aspirin 81 Mg Tabs (Aspirin) .Marland Kitchen... Take 1 tablet by mouth once a day  Complete Medication List: 1)  Actos 15 Mg Tabs (Pioglitazone hcl) .... Take 1 tablet by mouth once a day 2)  Amlodipine Besylate 5 Mg Tabs (Amlodipine besylate) .... Take 1 tablet by mouth once a day 3)  Enalapril Maleate 20 Mg Tabs (Enalapril maleate) .... Take 1 tablet by mouth once a day 4)  Aspirin 81 Mg Tabs (Aspirin) .... Take 1 tablet by mouth once a day 5)  Accu-chek Soft Touch Lancets Misc (Lancets) .... Test blood sugar once daily 6)  Vitamin D 1000 Unit Tabs (Cholecalciferol) .... 2,000 international units daily 7)  Accu-chek Aviva Strp (Glucose blood) .... Test blood sugar once daily 8)  Catalyn (mtv)  .... Take 1 tablet by mouth once a day 9)  Whey Protein Isolate Powd (Whey protein) .... Once daily 10)  Gastro Fiber  .... (shake) once daily 11)  Fish Oil Oil (Fish oil) .... (tuna)  two times a day 12)  Vitamin B-12 500 Mcg Tabs (Cyanocobalamin) .... Take 1 tablet by mouth three times a day  Patient Instructions: 1)  47month OV- - A1c before visit.   2)  Don't change your meds.  Keep working on [employer] and exercise.  3)  Get plenty of rest, drink lots of clear liquids, and use Tylenol for fever and comfort. Gargle with warm salt water and rest your voice.  Your cold should gradually get better.  Take care.    Orders Added: 1)  Est. Patient Level IV  [81191]    Current Allergies (reviewed today): ! SULFA ! VYTORIN (EZETIMIBE-SIMVASTATIN) ! PRAVACHOL

## 2011-03-14 ENCOUNTER — Ambulatory Visit: Payer: Medicare Other | Admitting: Family Medicine

## 2011-03-17 ENCOUNTER — Encounter: Payer: Self-pay | Admitting: Family Medicine

## 2011-03-21 ENCOUNTER — Ambulatory Visit (INDEPENDENT_AMBULATORY_CARE_PROVIDER_SITE_OTHER): Payer: Medicare Other | Admitting: Family Medicine

## 2011-03-21 ENCOUNTER — Encounter: Payer: Self-pay | Admitting: Family Medicine

## 2011-03-21 VITALS — BP 146/84 | HR 80 | Temp 98.1°F | Ht 63.0 in | Wt 194.0 lb

## 2011-03-21 DIAGNOSIS — R0602 Shortness of breath: Secondary | ICD-10-CM

## 2011-03-21 NOTE — Progress Notes (Signed)
Was episodically SOB after an episode with chest congestion and cold sx, all resolved now.  +FH CAD.  She had been out of exercise routine after cold/congestion.  She was SOB initially, but as she got back into exercise, she gradually got less SOB.  In the interval, her exercise tolerance increased.  She feels fine and back to baseline now, floor exercises and walking 1 mile 3x/week.  She had some "wheezing" but there is no h/o asthma.  No CP then or now.  No sx at rest, but would wheeze or get sob with exertion.  "I call it wheezing, it may not have been, I could hear myself breathing."  No ble edema. No vomiting.  No cough, no sputum.  Sugar has been running ~100 in AM. No change in meds. Distant smoker.  Husband smokes out of the home.  No h/o CAD, no h/o CHF.    Meds, vitals, and allergies reviewed.   ROS: See HPI.  Otherwise, noncontributory.  GEN: nad, alert and oriented HEENT: mucous membranes moist NECK: supple w/o LA CV: rrr.   PULM: ctab, no inc wob, no focal dec in bs ABD: soft, +bs EXT: no edema SKIN: no acute rash, chronic changes noted

## 2011-03-21 NOTE — Patient Instructions (Signed)
Let me know if you have any more shortness of breath, wheeze, CP, swelling, or other concerns.  Take care.  Glad to see you today.

## 2011-03-21 NOTE — Assessment & Plan Note (Addendum)
Now resolved.  She'll monitor her sx.  This may have been due to the preceding cold and relative deconditioning.  She declined further w/u today, ie EKG.  I told her that without it, I wouldn't have as much data, but that she appeared okay for outpatient fu.  She'll let me know if she has any more shortness of breath, wheeze, CP, swelling, or other concerns.  She prefers this and well let me know if there are another changes.

## 2011-03-28 ENCOUNTER — Ambulatory Visit: Payer: Medicare Other | Admitting: Family Medicine

## 2011-04-29 NOTE — Op Note (Signed)
NAME:  Natasha Woodard, Natasha Woodard             ACCOUNT NO.:  [id number]   MEDICAL RECORD NO.:  [id number]            PATIENT TYPE:   LOCATION:                                 FACILITY:   PHYSICIAN:  Katy Fitch. Sypher, M.D.      DATE OF BIRTH:   DATE OF PROCEDURE:  12/14/2006  DATE OF DISCHARGE:                               OPERATIVE REPORT   PREOPERATIVE DIAGNOSIS:  Chronic pain, left shoulder following injury in  summer of 2007, with diagnosis of chronic retracted rotator cuff tear  made in October 2007.   POSTOPERATIVE DIAGNOSIS:  Chronic retracted rotator cuff tear involving  the subscapularis, supraspinatus, infraspinatus and teres minor with  extensive tendinopathy of subscapularis, extensive degenerative labral  tearing and a 20% long head of biceps tear with unfavorable anterior  cruciate anatomy and chronic adhesive capsulitis.   OPERATION:  1. Examination of left shoulder under anesthesia.  2. Manipulation of adhesive capsulitis, left shoulder.  3. Arthroscopic debridement of left shoulder including removal of      labral tear fragments, debridement of granulation tissue due to      adhesive capsulitis with capsulectomy and debridement of deep      surface rotator cuff tear fragments followed by documentation of      rotator cuff predicament.  4. Open subacromial decompression with bursectomy, release of      coracohumeral ligament and mobilization of retracted rotator cuff      tear.  5. Open resection of distal clavicle.  6. Reconstruction of left rotator cuff utilizing a subscapularis      dorsal transfer and extensive mobilization of the supraspinatus,      infraspinatus and teres minor.   OPERATING SURGEON:  Josephine Igo, MD   ASSISTANT:  Marveen Reeks Dasnoit, PA-C   ANESTHESIA:  General endotracheal supplemented by left interscalene  block.   SUPERVISING ANESTHESIOLOGIST:  Jairo Ben, MD   INDICATIONS:  Natasha Woodard is a 70 year old, right-hand dominant  woman  who was referred by Dr. Abigail Miyamoto for evaluation and management of a  painful and weak left shoulder.   She has been acquainted with our practice for nearly 20 years as I had  cared for her son in the remote past.  In the summer of 2007, she  sustained an injury to her left shoulder and since that time had had  pain and difficulty sleeping on the shoulder at night.  She had weakness  of abduction and external rotation and plain films documented very  unfavorable AC anatomy, sclerosis of greater tuberosity and anterior  lateral osteophytes.   Her clinical examination suggested a rotator cuff tear.  She was sent  for an MRI on October 02, 2006.  This documented a chronic, retracted  rotator cuff tear with loss of volume in the supraspinatus and  infraspinatus muscles.   At that time we advised her to proceed with immediate repair of her  cuff.  For various reasons, she deferred surgery until January of 2008.  She was informed prior to surgery at the time of her consult that  waiting may render her cuff  difficult to repair more so than in October  of 2007.   She understands that we are making no promises that a complete cuff tear  can be accomplished.  However, given her unfavorable AC anatomy and  large cuff tear avulsion, she is a candidate to develop cuff tear  arthropathy.   We have advised her that any repair of the cuff that creates a  stabilizing sling of the anterior and posterior rotator cuff will  prevent the progression towards rotator cuff arthropathy.   With this in mind she proceeds with surgery at this time.   Preoperatively, she was advised of the routine risks of surgery and  anesthesia including infection, anesthesia complications, failure of  hardware, failure to relieve all her pain and residual stiffness  following surgery.   After informed consent, she was brought to the operating room at this  time.   PROCEDURE:  Natasha Woodard was brought to the operating  room and placed  in the supine position on the operating table.   Following an anesthesia consult by Dr. Jean Rosenthal in the holding area, an  interscalene block was placed without complication.   Natasha Woodard was brought to the operating room and placed in the supine  position on the operating table and under Dr. Edison Pace direct  supervision, general endotracheal anesthesia induced.  She was carefully  positioned in the beach-chair position with the aid of a torso and head  holder designed for shoulder arthroscopy.  The entire left upper  extremity and forequarter were prepped with DuraPrep and draped with  impervious arthroscopy drapes.  Examination of the shoulder under  anesthesia revealed combined elevation of 110 degrees, external rotation  of 50 degrees, internal rotation of 20 degrees.  After gentle  manipulation, the combined elevation was increased with release of  adhesions to a combined elevation of 170 degrees, external rotation of  85 degrees and internal rotation of 70 degrees.   The arthroscope was then introduced through a standard posterior viewing  portal through the posterior soft spot.  Diagnostic arthroscopy revealed  abundant granulation tissue due to adhesive capsulitis.  The labrum was  quite degenerative.  The status of the biceps tendon and rotator cuff  was documented with the digital camera.  The biceps had a posterior 20%  tear but 80% was intact through the rotator interval and the area  between the greater and lesser tuberosities.  The biceps origin was  stable.  The degenerative labral fragments were debrided with a suction  shaver brought in anteriorly as was the granulation tissue.  The  subscapularis tendon was debrided of adhesions to facilitate  mobilization for reconstruction of the rotator cuff.  The humeral head had grade 2 chondromalacia on its superior half.  The inferior recess  was filled with abundant granulation tissue due to adhesive  capsulitis  predicament.   After completion of the intra-articular debridement, the scope was  removed and we proceeded directly to reconstruction of the rotator cuff.   An 8-cm incision was fashioned from the distal clavicle across the  anterior acromion.  The anterior third of deltoid was elevated off of  the [facility] joint and anterior acromion.  A 2.5 cm muscle split was  accomplished laterally to facilitate exposure of the greater tuberosity.   The retracted rotator cuff tear was identified posteriorly.  With great  effort, adhesions of the cuff and fibrotic bursa were debrided sharply  with tenotomy scissors and a Cobb elevator.  The coracohumeral ligament  was identified and released.  It had thickened to about a 4 mm width.   The subscapularis superior 50% was mobilized as it had ruptured off of  the lesser tuberosity.  This was brought as a flap to the  musculotendinous junction, transferred superiorly and posteriorly and  will ultimately be used to cover the humeral head.  After freshening the  margins of the supraspinatus and infraspinatus, the greater tuberosity  was cleared of soft tissues, followed by the use of a power bur to lower  the profile of the tuberosity 3 mm, removing all osteophytes.  An  anterior acromioplasty was performed under direct vision with an  oscillating saw and a rongeur.  The medial osteophyte at the [facility] joint  along the acromial edge was removed with a rongeur and the distal 15 mm  of clavicle was removed with an oscillating saw.   We were able to mobilize the cuff to create a 95% repair of the  supraspinatus and infraspinatus anatomically to the greater tuberosity  and with transfer of the subscapularis, the region overlying long head  biceps was covered with the tongue of subscapularis brought dorsally and  posteriorly.   The cuff was inset with a bicortical screw anchor at the medial  footprint of the supraspinatus and infraspinatus, as well as at  the  medial footprint of the subscapularis.  After these mattress sutures  were placed, a McLaughlin through bone suture was used to converge the  cuff brought through bone and tied over the lateral cortex.  The  mattress sutures were inset with a total of six mattress sutures  followed by use of two of the sutures, one from the supraspinatus, one  from the subscapularis with an over-the-top technique utilizing a push  lock anchor insetting the margin.  The rim of the repair was then  smoothed with a 0 Vicryl suture.   After further bursectomy, the deltoid was repaired to the trapezius  muscle, closing the dead space created by distal clavicle resection,  followed by repair of the deltoid anatomically to the anterior acromion  and the deltoid split and repaired with mattress suture of 0 Vicryl.   A very satisfactory repair was achieved.  For aftercare, Ms. Palen will be maintained in a sling for six to  eight weeks.  She will begin gentle passive exercise within 24 hours.   She will be admitted to the recovery care center for observation of her  vital signs, management of her type 2 diabetes and hypertension.  We  will continue her routine medications in the perioperative period.  She  was provided 1 gram of Ancef 1 hour preoperatively and will receive 2  additional grams q.8 hours in the postoperative period.  She will be  provided IV and p.o. Dilaudid and IV PCA morphine for analgesic  medication postoperatively.      Katy Fitch Sypher, M.D.  Electronically Signed     RVS/MEDQ  D:  12/14/2006  T:  12/14/2006  Job:  [id number]   cc:   Katy Fitch. Sypher, M.D.  Chales Salmon. Abigail Miyamoto, M.D.

## 2011-05-10 ENCOUNTER — Other Ambulatory Visit: Payer: Medicare Other

## 2011-05-13 ENCOUNTER — Ambulatory Visit: Payer: Medicare Other | Admitting: Family Medicine

## 2011-05-17 ENCOUNTER — Other Ambulatory Visit: Payer: Self-pay | Admitting: *Deleted

## 2011-05-17 MED ORDER — GLUCOSE BLOOD VI STRP: ORAL_STRIP | Status: DC

## 2011-05-18 ENCOUNTER — Other Ambulatory Visit (INDEPENDENT_AMBULATORY_CARE_PROVIDER_SITE_OTHER): Payer: Medicare Other | Admitting: Family Medicine

## 2011-05-18 DIAGNOSIS — E119 Type 2 diabetes mellitus without complications: Secondary | ICD-10-CM

## 2011-05-24 ENCOUNTER — Other Ambulatory Visit: Payer: Self-pay

## 2011-05-30 ENCOUNTER — Ambulatory Visit (INDEPENDENT_AMBULATORY_CARE_PROVIDER_SITE_OTHER): Payer: Medicare Other | Admitting: Family Medicine

## 2011-05-30 ENCOUNTER — Encounter: Payer: Self-pay | Admitting: Family Medicine

## 2011-05-30 VITALS — BP 120/80 | HR 92 | Temp 98.7°F | Ht 63.0 in | Wt 194.5 lb

## 2011-05-30 DIAGNOSIS — E119 Type 2 diabetes mellitus without complications: Secondary | ICD-10-CM

## 2011-05-30 NOTE — Patient Instructions (Signed)
Come back for fasting labs in 10/12 with a visit a few days later.  Take care.  Keep working on [employer] and exercising.

## 2011-05-30 NOTE — Progress Notes (Signed)
Diabetes:  Using medications without difficulties:yes Hypoglycemic episodes:no Hyperglycemic episodes:no Feet problems:no Blood Sugars averaging: ~100 in Am Nocturia, several times a night.  Frequency isn't a problem during the day.  We talked about this.  No dysuria.  She'll cut back on PM liquid intake.  I don't think this is related to sugar.  Labs d/w pt.   She has been trying to help with a grandson, and this has been stressful.   PMH and SH reviewed  Meds, vitals, and allergies reviewed.   ROS: See HPI.  Otherwise negative.    GEN: nad, alert and oriented HEENT: mucous membranes moist NECK: supple w/o LA CV: rrr. PULM: ctab, no inc wob ABD: soft, +bs EXT: no edema SKIN: no acute rash, chronic changes noted.   Diabetic foot exam: Normal inspection No skin breakdown No calluses  Normal DP pulses Normal sensation to light touch and monofilament Nails thickened

## 2011-05-30 NOTE — Assessment & Plan Note (Signed)
A1c at goal. No change in meds.  Continue diet and exercise.  Recheck in 3-4 months.  She agrees. >25 min spent with face to face with patient, >50% counseling and/or coordinating care

## 2011-06-16 ENCOUNTER — Telehealth: Payer: Self-pay | Admitting: *Deleted

## 2011-06-16 NOTE — Telephone Encounter (Signed)
Pt is asking when is she due for her next colonoscopy, she had her last one with Dr. Abigail Miyamoto, but she doesn't remember when that was.

## 2011-06-16 NOTE — Telephone Encounter (Signed)
Advised patient

## 2011-06-16 NOTE — Telephone Encounter (Signed)
Done 2008, not due for repeat yet.  GI will usually call pt when time to consider repeat.  I would not expect it to be before 2013.  Thanks.

## 2011-07-06 ENCOUNTER — Encounter: Payer: Self-pay | Admitting: Family Medicine

## 2011-07-06 ENCOUNTER — Ambulatory Visit (INDEPENDENT_AMBULATORY_CARE_PROVIDER_SITE_OTHER): Payer: Medicare Other | Admitting: Family Medicine

## 2011-07-06 DIAGNOSIS — M542 Cervicalgia: Secondary | ICD-10-CM

## 2011-07-06 MED ORDER — CYCLOBENZAPRINE HCL 10 MG PO TABS: 5.0000 mg | ORAL_TABLET | Freq: Three times a day (TID) | ORAL | Status: DC | PRN

## 2011-07-06 NOTE — Progress Notes (Signed)
Neck pain.  Started Sunday in R shoulder and R side of neck.  Since then moved up the back of the neck.  Pain with ROM.  Taking advil for pain.  400mg  q6h, some relief with this.  No GI upset.  No weakness in arms, legs.  No known trigger.  This has happened before, but this is some worse than prev.  No fall, trauma.    Meds, vitals, and allergies reviewed.   ROS: See HPI.  Otherwise, noncontributory.  nad ncat Tm wnl x2, op and nasal exam wnl Neck supple but with diffuse paraspinal tenderness  No LA No acute skin changes.  Normal motor and sensation x4.

## 2011-07-06 NOTE — Assessment & Plan Note (Signed)
Likely MSK strain, possibly due to sleep position.  Reassuring exam, use flexeril with sedation caution, ibuprofen with GI caution, gentle stretching and heat.  Should gradually resolve.

## 2011-07-06 NOTE — Patient Instructions (Signed)
I would gently stretch your neck. Use a heating pad, be careful not to get burned.  Take the flexeril, 1/2 to 1 tab, every 8 hours as needed.  It can make you drowsy.  Take ibuprofen 400mg , 4 times a day with food.  This should gradually improve.  Take care.

## 2011-07-07 ENCOUNTER — Ambulatory Visit: Payer: Medicare Other | Admitting: Family Medicine

## 2011-08-11 ENCOUNTER — Encounter: Payer: Self-pay | Admitting: Family Medicine

## 2011-08-11 ENCOUNTER — Ambulatory Visit: Payer: Medicare Other | Admitting: Family Medicine

## 2011-08-11 ENCOUNTER — Ambulatory Visit (INDEPENDENT_AMBULATORY_CARE_PROVIDER_SITE_OTHER): Payer: Medicare Other | Admitting: Family Medicine

## 2011-08-11 ENCOUNTER — Ambulatory Visit (INDEPENDENT_AMBULATORY_CARE_PROVIDER_SITE_OTHER)
Admission: RE | Admit: 2011-08-11 | Discharge: 2011-08-11 | Disposition: A | Discharge status: Home / Self Care | Payer: Medicare Other | Source: Ambulatory Visit | Attending: Family Medicine | Admitting: Family Medicine

## 2011-08-11 VITALS — BP 128/76 | HR 76 | Temp 99.1°F

## 2011-08-11 DIAGNOSIS — M25559 Pain in unspecified hip: Secondary | ICD-10-CM

## 2011-08-11 MED ORDER — TRAMADOL HCL 50 MG PO TABS: 50.0000 mg | ORAL_TABLET | Freq: Four times a day (QID) | ORAL | Status: DC | PRN

## 2011-08-11 NOTE — Patient Instructions (Signed)
Take tramadol for pain.  It can make you drowsy.  Take ibuprofen with food, 3 times a day.  This should gradually get better.  Call us next week if you aren't improving.  Take care.

## 2011-08-11 NOTE — Progress Notes (Signed)
Lower abd/hip/pelvic pain started last night.  No relief with ibuprofen, muscle relaxer.  Pain in pelvic and buttock area, L>R.  No leg pain.  Pain walking but able to bear weight.  No trauma, no triggers known.  No fevers, diarrhea, vomiting.  No dysuria.  No falls.  No vaginal discharge, no VB.    Meds, vitals, and allergies reviewed.   ROS: See HPI.  Otherwise, noncontributory.  nad but uncomfortable, in wheelchair but able to bear weight rrr ctab Back w/o midline pain or paraspinal tenderness Abd soft, not ttp, normal BS, no rebound, obese Pannus not ttp No hernia felt in groin, chaperoned exam Greater troch not ttp x2, but pain with manipulation of L hip > R hip.  No radicular sx.   PROM of L hip much more comfortable than AROM Thigh not ttp Distal motor exam intact  xrays with deg changes at hip noted but no fx.

## 2011-08-11 NOTE — Assessment & Plan Note (Addendum)
She had prev normal DXA years ago.  Bone mineralization appears wnl on exam and no fx seen.  I doubt stress fracture, I think this is flare of OA pain in the hip.  D/w pt re: ddx, she is okay with observation and tx with tramadol, nsaid with GI caution and flexeril prn.  She will call back as needed.  >25 min spent with face to face with patient, >50% counseling and/or coordinating care.

## 2011-09-13 ENCOUNTER — Other Ambulatory Visit: Payer: Medicare Other

## 2011-09-14 ENCOUNTER — Other Ambulatory Visit (INDEPENDENT_AMBULATORY_CARE_PROVIDER_SITE_OTHER): Payer: Medicare Other

## 2011-09-14 DIAGNOSIS — E119 Type 2 diabetes mellitus without complications: Secondary | ICD-10-CM

## 2011-09-14 LAB — LIPID PANEL
LDL Cholesterol: 117 mg/dL — ABNORMAL HIGH (ref 0–99)
Total CHOL/HDL Ratio: 4

## 2011-09-14 LAB — COMPREHENSIVE METABOLIC PANEL
ALT: 15 U/L (ref 0–35)
AST: 21 U/L (ref 0–37)
Calcium: 9.2 mg/dL (ref 8.4–10.5)
Chloride: 107 mEq/L (ref 96–112)
Creatinine, Ser: 0.8 mg/dL (ref 0.4–1.2)
Potassium: 4.6 mEq/L (ref 3.5–5.1)
Sodium: 141 mEq/L (ref 135–145)
Total Protein: 6.8 g/dL (ref 6.0–8.3)

## 2011-09-14 NOTE — Progress Notes (Signed)
Addended by: Alvina Chou on: 09/14/2011 09:47 AM   Modules accepted: Orders

## 2011-09-16 LAB — POCT CARDIAC MARKERS
CKMB, poc: 1.7 ng/mL (ref 1.0–8.0)
Myoglobin, poc: 72.1 ng/mL (ref 12–200)
Troponin i, poc: <0.05 ng/mL (ref 0.00–0.09)

## 2011-09-16 LAB — DIFFERENTIAL
Basophils Relative: 0 % (ref 0–1)
Lymphocytes Relative: 34 % (ref 12–46)
Lymphs Abs: 2 10*3/uL (ref 0.7–4.0)
Monocytes Absolute: 0.3 10*3/uL (ref 0.1–1.0)
Monocytes Relative: 6 % (ref 3–12)
Neutro Abs: 3.4 10*3/uL (ref 1.7–7.7)
Neutrophils Relative %: 57 % (ref 43–77)

## 2011-09-16 LAB — POCT I-STAT, CHEM 8
BUN: 17 mg/dL (ref 6–23)
Calcium, Ion: 1.25 mmol/L (ref 1.12–1.32)
Chloride: 106 mEq/L (ref 96–112)
Creatinine, Ser: 0.8 mg/dL (ref 0.4–1.2)
Glucose, Bld: 86 mg/dL (ref 70–99)
HCT: 44 % (ref 36.0–46.0)
Hemoglobin: 15 g/dL (ref 12.0–15.0)
Potassium: 4 mEq/L (ref 3.5–5.1)
Sodium: 143 mEq/L (ref 135–145)
TCO2: 27 mmol/L (ref 0–100)

## 2011-09-16 LAB — CBC
Hemoglobin: 13.4 g/dL (ref 12.0–15.0)
MCHC: 33.4 g/dL (ref 30.0–36.0)
RBC: 4.54 MIL/uL (ref 3.87–5.11)
WBC: 5.9 10*3/uL (ref 4.0–10.5)

## 2011-09-21 ENCOUNTER — Ambulatory Visit (INDEPENDENT_AMBULATORY_CARE_PROVIDER_SITE_OTHER): Payer: Medicare Other | Admitting: Family Medicine

## 2011-09-21 ENCOUNTER — Encounter: Payer: Self-pay | Admitting: Family Medicine

## 2011-09-21 VITALS — BP 122/70 | HR 87 | Temp 98.6°F | Wt 195.1 lb

## 2011-09-21 DIAGNOSIS — E119 Type 2 diabetes mellitus without complications: Secondary | ICD-10-CM

## 2011-09-21 DIAGNOSIS — E785 Hyperlipidemia, unspecified: Secondary | ICD-10-CM

## 2011-09-21 DIAGNOSIS — M25569 Pain in unspecified knee: Secondary | ICD-10-CM

## 2011-09-21 DIAGNOSIS — I10 Essential (primary) hypertension: Secondary | ICD-10-CM

## 2011-09-21 NOTE — Progress Notes (Signed)
Diabetes:  Using medications without difficulties:yes Hypoglycemic episodes:no Hyperglycemic episodes:no Feet problems:no Blood Sugars averaging: ~120s after eating, 90s before breakfast eye exam within last year: yes  Hypertension:   Using medication without problems or lightheadedness: yes Chest pain with exertion:no Edema:no Short of breath:no  Elevated Cholesterol: Using medications without problems: no meds due to statin intolerance Muscle aches: no, since off meds Diet compliance:yes Exercise:yes  PMH and SH reviewed.   Vital signs, Meds and allergies reviewed.  ROS: See HPI.  Otherwise nontributory.   GEN: nad, alert and oriented HEENT: mucous membranes moist NECK: supple w/o LA CV: rrr PULM: ctab, no inc wob ABD: soft, +bs EXT: no edema SKIN: no acute rash  Diabetic foot exam: Normal inspection No skin breakdown No calluses  Normal DP pulses Normal sensation to light tough and monofilament Nails thickened Chronic ichyothysis changes noted

## 2011-09-21 NOTE — Assessment & Plan Note (Signed)
She had been taking ibuprofen 200mg  at night and this was helping the pain. This should be okay to continue.

## 2011-09-21 NOTE — Assessment & Plan Note (Signed)
Controlled, no change in meds. D/w pt about diet and exercise . 

## 2011-09-21 NOTE — Assessment & Plan Note (Signed)
Controlled, no change in meds.  D/w pt about diet and exercise.  Continue as is.  Recheck in 4 months.

## 2011-09-21 NOTE — Assessment & Plan Note (Signed)
Controlled TC, LDL near goal, no change in meds as she is statin intolerant.  D/w pt about diet and exercise.

## 2011-09-21 NOTE — Patient Instructions (Signed)
Don't change your meds.  It's okay to take 200mg  of advil at night with your other meds. I would eat something when you take it.  Recheck A1c in 2/13 before a OV.  You don't need to fast for that.  Keep walking and take care. Glad to see you.

## 2011-09-27 ENCOUNTER — Other Ambulatory Visit: Payer: Self-pay | Admitting: *Deleted

## 2011-09-27 MED ORDER — PIOGLITAZONE HCL 15 MG PO TABS: 15.0000 mg | ORAL_TABLET | Freq: Every day | ORAL | Status: DC

## 2011-10-12 ENCOUNTER — Ambulatory Visit (INDEPENDENT_AMBULATORY_CARE_PROVIDER_SITE_OTHER): Payer: Medicare Other | Admitting: Family Medicine

## 2011-10-12 ENCOUNTER — Encounter: Payer: Self-pay | Admitting: Family Medicine

## 2011-10-12 VITALS — BP 130/82 | HR 88 | Temp 98.4°F | Wt 198.8 lb

## 2011-10-12 DIAGNOSIS — T148XXA Other injury of unspecified body region, initial encounter: Secondary | ICD-10-CM

## 2011-10-12 DIAGNOSIS — IMO0002 Reserved for concepts with insufficient information to code with codable children: Secondary | ICD-10-CM

## 2011-10-12 NOTE — Progress Notes (Signed)
Subjective:    Patient ID: Natasha Woodard, female    DOB: 1941/04/08, 70 y.o.   MRN: [id number]  HPI  70 yo pt of Dr. Para March here for wound on right hand.  Noticed it a few days ago but cannot remember an injury. Pt is a diabetic so she was concerned that this is a diabetic wound or precancerous lesion. No drainage or pain from the wound. No fevers, chills, nausea or vomiting.    CBGs stable.  Patient Active Problem List  Diagnoses  . DIABETES MELLITUS, TYPE II  . HYPERLIPIDEMIA  . OBESITY  . HYPERTENSION  . EMPHYSEMA  . BRONCHIECTASIS  . PULMONARY NODULE  . HIATAL HERNIA WITH REFLUX  . KNEE PAIN, LEFT  . OTHER CONGENITAL ANOMALY GALLBLADDER BDS&LIVER  . ICHTHYOSIS CONGENITA  . Hip pain   Past Medical History  Diagnosis Date  . Hypertension   . Hyperlipidemia     Statin intolerant  . Diabetes mellitus     Type II  . Hiatal hernia     with reflux  . Bronchiectasis   . Emphysema of lung   . Ichthyosis congenita   . Obesity   . Pulmonary nodule     Imaged multiple times and benign appearing  . Plantar fasciitis     Left  . NSVD (normal spontaneous vaginal delivery) 1971 & 1972  . Calcific tendonitis 2008    Treatment of   Past Surgical History  Procedure Date  . Abdominal hysterectomy 1975    TAH-BSO  . Corn removal 1968  . Abscess drainage 1972   History  Substance Use Topics  . Smoking status: Former Smoker -- 28 years    Types: Cigarettes    Quit date: 12/12/1993  . Smokeless tobacco: Never Used  . Alcohol Use: Not on file   Family History  Problem Relation Age of Onset  . Heart disease Mother   . Heart disease Father   . Heart disease Brother     MI  . Cancer Brother     Lung   Allergies  Allergen Reactions  . Ezetimibe-Simvastatin     REACTION: Muscle aches (side effect)  . Pravastatin Sodium     REACTION: Muscle aches (side effect)  . Sulfonamide Derivatives    Current Outpatient Prescriptions on File Prior to Visit  Medication  Sig Dispense Refill  . amLODipine (NORVASC) 5 MG tablet Take 5 mg by mouth daily.        Marland Kitchen aspirin 81 MG tablet Take 81 mg by mouth daily.        . Cholecalciferol (VITAMIN D) 1000 UNITS capsule Take 2 tablets daily.       . cyclobenzaprine (FLEXERIL) 10 MG tablet Take 0.5-1 tablets (5-10 mg total) by mouth every 8 (eight) hours as needed for muscle spasms (sedation caution).  30 tablet  1  . enalapril (VASOTEC) 20 MG tablet Take 20 mg by mouth daily.        . fish oil-omega-3 fatty acids 1000 MG capsule Take 1 g by mouth 2 (two) times daily.        Marland Kitchen glucose blood (ACCU-CHEK AVIVA) test strip Test blood sugar once daily.  100 each  3  . ibuprofen (ADVIL,MOTRIN) 200 MG tablet Take 200 mg by mouth daily as needed.        . Lancets (ACCU-CHEK SOFT TOUCH) lancets Test blood sugar once daily.       . Multiple Vitamin (MULTIVITAMIN PO) Take by mouth daily.        Marland Kitchen  pioglitazone (ACTOS) 15 MG tablet Take 1 tablet (15 mg total) by mouth daily.  30 tablet  11  . traMADol (ULTRAM) 50 MG tablet Take 1 tablet (50 mg total) by mouth every 6 (six) hours as needed for pain.  40 tablet  1  . Whey Protein Isolate POWD Once daily.        The PMH, PSH, Social History, Family History, Medications, and allergies have been reviewed in [facility], and have been updated if relevant.   Review of Systems See HPI    Objective:   Physical Exam BP 130/82  Pulse 88  Temp(Src) 98.4 F (36.9 C) (Oral)  Wt 198 lb 12 oz (90.152 kg) Gen:  Alert, pleasant, NAD Skin: Chronic ichyothysis, two small linear superficial abrasions on left hand.  No surrounding erythema, no warmth or drainage.    Assessment & Plan:   1. Abrasion    New.  Reassurance provided, given topical abx ointment. No indication for oral abx currently. Red flags given--see pt instructions.

## 2011-10-12 NOTE — Patient Instructions (Signed)
Nice to meet you. It looks like you probably scratched yourself and some bacteria got in the scratch. Apply the ointment I gave you twice daily, keep it covered. Let me know if you develop redness, fever or drainage.

## 2011-10-13 ENCOUNTER — Ambulatory Visit: Payer: Medicare Other | Admitting: Family Medicine

## 2011-10-31 ENCOUNTER — Ambulatory Visit: Payer: Medicare Other | Admitting: Family Medicine

## 2011-11-04 ENCOUNTER — Ambulatory Visit: Payer: Medicare Other | Admitting: Family Medicine

## 2011-11-11 ENCOUNTER — Encounter: Payer: Self-pay | Admitting: Family Medicine

## 2011-11-11 ENCOUNTER — Ambulatory Visit (INDEPENDENT_AMBULATORY_CARE_PROVIDER_SITE_OTHER): Payer: Medicare Other | Admitting: Family Medicine

## 2011-11-11 VITALS — BP 136/76 | HR 78 | Temp 98.7°F | Wt 201.1 lb

## 2011-11-11 DIAGNOSIS — E119 Type 2 diabetes mellitus without complications: Secondary | ICD-10-CM

## 2011-11-11 MED ORDER — METFORMIN HCL 500 MG PO TABS: ORAL_TABLET | ORAL | Status: DC

## 2011-11-11 NOTE — Progress Notes (Signed)
DM2.  She had concerns and ee talked about actos.  I offered to change her to metformin. She has good control of DM2 with actos.  Never on other meds.  No contraindication to metformin.  Her sugars have been controlled on home checks.   Some nocturia but no polyuria during the day.  No burning.  (We talked about limiting fluid intake at night before bed as she had been drinking less fluid during the early portion of the day and more in afternoon- this coincided with the nocturia).  Meds, vitals, and allergies reviewed.   ROS: See HPI.  Otherwise, noncontributory.  nad Rest of exam deferred as >105min spent talking about DM and metformin.

## 2011-11-11 NOTE — Patient Instructions (Signed)
Stop the actos.  Take metformin 1 a day for 1 week and then 1 tab twice a day.  Let me know about your sugar in mid December. If it upsets your stomach, cut back by 1 pill and let me know.

## 2011-11-15 NOTE — Assessment & Plan Note (Addendum)
Change to metformin, gradually increase to bid dosing, call back with numbers in 12/12- sooner if needed.  She agrees and felt good about the decision. >15 min spent with face to face with patient, >50% counseling and/or coordinating care

## 2011-11-17 ENCOUNTER — Telehealth: Payer: Self-pay | Admitting: Internal Medicine

## 2011-11-17 DIAGNOSIS — M79673 Pain in unspecified foot: Secondary | ICD-10-CM

## 2011-11-17 NOTE — Telephone Encounter (Signed)
If it's recurrent, then podiatry eval isn't a bad idea.  I put in the referral.  Thanks.

## 2011-11-17 NOTE — Telephone Encounter (Signed)
Patient called and stated her Plantar Fascitis in her left foot has flared up again and is bothering her and she wanted to know if she should come into see you or she just need a referral to a podiatrist.  Please advise.

## 2011-11-18 NOTE — Telephone Encounter (Signed)
Patient advised.  She says an appt has already been made for Tuesday.

## 2011-11-22 ENCOUNTER — Other Ambulatory Visit: Payer: Self-pay | Admitting: *Deleted

## 2011-11-22 MED ORDER — ENALAPRIL MALEATE 20 MG PO TABS: 20.0000 mg | ORAL_TABLET | Freq: Every day | ORAL | Status: DC

## 2011-11-22 MED ORDER — AMLODIPINE BESYLATE 5 MG PO TABS: 5.0000 mg | ORAL_TABLET | Freq: Every day | ORAL | Status: DC

## 2011-11-22 MED ORDER — METFORMIN HCL 500 MG PO TABS: ORAL_TABLET | ORAL | Status: DC

## 2011-11-22 NOTE — Telephone Encounter (Signed)
Patient called stating that she needs her Rx's sent in for 90 day supply and not 30 day.  Rx's sent patient notified via telephone.

## 2011-12-20 ENCOUNTER — Telehealth: Payer: Self-pay | Admitting: *Deleted

## 2011-12-20 NOTE — Telephone Encounter (Signed)
Patient wants to know when she should come back in for lab work for her DM, etc.?

## 2011-12-21 NOTE — Telephone Encounter (Signed)
Recheck A1c in 2/13 before OV.  Thanks.  Future orders are in.  She should be able to get it done at [facility] lab in [place] if she prefers.

## 2011-12-21 NOTE — Telephone Encounter (Signed)
Appts scheduled:

## 2012-01-25 ENCOUNTER — Other Ambulatory Visit (INDEPENDENT_AMBULATORY_CARE_PROVIDER_SITE_OTHER): Payer: Medicare Other

## 2012-01-25 DIAGNOSIS — E119 Type 2 diabetes mellitus without complications: Secondary | ICD-10-CM

## 2012-01-31 ENCOUNTER — Other Ambulatory Visit: Payer: Self-pay | Admitting: *Deleted

## 2012-01-31 ENCOUNTER — Ambulatory Visit (INDEPENDENT_AMBULATORY_CARE_PROVIDER_SITE_OTHER): Payer: Medicare Other | Admitting: Family Medicine

## 2012-01-31 ENCOUNTER — Encounter: Payer: Self-pay | Admitting: Family Medicine

## 2012-01-31 ENCOUNTER — Ambulatory Visit: Payer: Medicare Other | Admitting: Family Medicine

## 2012-01-31 VITALS — BP 130/72 | HR 77 | Temp 98.4°F | Wt 196.8 lb

## 2012-01-31 DIAGNOSIS — M7918 Myalgia, other site: Secondary | ICD-10-CM

## 2012-01-31 DIAGNOSIS — E119 Type 2 diabetes mellitus without complications: Secondary | ICD-10-CM

## 2012-01-31 DIAGNOSIS — IMO0001 Reserved for inherently not codable concepts without codable children: Secondary | ICD-10-CM

## 2012-01-31 MED ORDER — TRAMADOL HCL 50 MG PO TABS: 50.0000 mg | ORAL_TABLET | Freq: Four times a day (QID) | ORAL | Status: DC | PRN

## 2012-01-31 NOTE — Assessment & Plan Note (Addendum)
A1c controlled, no change in meds. Recheck in 6 months.  D/w pt about diet and exercise. >25 min spent with face to face with patient, >50% counseling in total.

## 2012-01-31 NOTE — Patient Instructions (Addendum)
Recheck labs in 6 months, OV a few days later.   Take the tramadol for pain, sit on a pillow, and this should gradually get better.  Take care.

## 2012-01-31 NOTE — Assessment & Plan Note (Signed)
Likely mild soft tissue irritation due to sitting on hard chair.  Benign exam o/w.  F/u prn.  Tramadol for pain.  She understood.

## 2012-01-31 NOTE — Progress Notes (Signed)
Diabetes:  Using medications without difficulties:yes Hypoglycemic episodes:no Hyperglycemic episodes:no Feet problems:no Blood Sugars averaging:90s-100 in AM, checked after meals it was 130-160.   A1c 6.5 after change to metformin.  Down 4 lbs.   Buttock pain.  Prev with L hip pain in 8/12.  It was like an OA flare, better with tramadol and she did well over the fall/winter. Last week, she had more pain.  She had been sitting in a hard chair, since then had pain with sitting.  No pain when supine or walking.    Her foot pain is better. Seen at foot clinic, dx'd with achille's tendonitis.  Was in a boot for 4 weeks.  Out of it now.    PMH and SH reviewed  Meds, vitals, and allergies reviewed.   ROS: See HPI.  Otherwise negative.    GEN: nad, alert and oriented HEENT: mucous membranes moist NECK: supple w/o LA CV: rrr. PULM: ctab, no inc wob ABD: soft, +bs EXT: no edema SKIN: no acute rash TTP on L buttock but normal ROM of L hip.  No pain with forward flexion at him, no pain on int/ext rotation of hip.  No midline back pain  Diabetic foot exam: Normal inspection No skin breakdown No calluses  Normal DP pulses Normal sensation to light touch and monofilament Nails normal

## 2012-03-01 ENCOUNTER — Ambulatory Visit (INDEPENDENT_AMBULATORY_CARE_PROVIDER_SITE_OTHER): Payer: Medicare Other | Admitting: Family Medicine

## 2012-03-01 ENCOUNTER — Encounter: Payer: Self-pay | Admitting: Family Medicine

## 2012-03-01 VITALS — BP 130/78 | HR 80 | Temp 98.5°F | Wt 193.2 lb

## 2012-03-01 DIAGNOSIS — Q809 Congenital ichthyosis, unspecified: Secondary | ICD-10-CM

## 2012-03-01 DIAGNOSIS — IMO0001 Reserved for inherently not codable concepts without codable children: Secondary | ICD-10-CM

## 2012-03-01 DIAGNOSIS — M7918 Myalgia, other site: Secondary | ICD-10-CM

## 2012-03-01 MED ORDER — TRAMADOL HCL 50 MG PO TABS: 50.0000 mg | ORAL_TABLET | Freq: Four times a day (QID) | ORAL | Status: DC | PRN

## 2012-03-01 NOTE — Assessment & Plan Note (Signed)
I am not comfortable putting her on estrogen for this.  I would like derm input, but she declined this.  If she changes her mind, she can call back and we'll set up a referral.  She agrees.

## 2012-03-01 NOTE — Assessment & Plan Note (Signed)
She could have had a deep tissue/bone bruise.  It's slowly getting better.  She was worried about renal function on ibuprofen.  The biggest risks are DM2/HTN and both are controlled.  She's on a low NSAID dose and will likely taper this as it continues to improved.  No need to image and she was reassured.  Call back as needed.

## 2012-03-01 NOTE — Patient Instructions (Signed)
I would keep taking the ibuprofen and tramadol and the pain should gradually/slowly get better.  Take care and call me if you want to go see derm.

## 2012-03-01 NOTE — Progress Notes (Signed)
H/o ichythosis.  She was asking about going back on estrogen.  "It's getting worse, cracking and peeling, itching."  Wearing gloves and trying to protect her hands/skin.  Estrogen had helped prev.    Still with L lower buttock pain.  Tramadol and ibuprofen help some. Worse pain sitting, worse in a hard chair.  No pain standing up or laying down.  Only with pain sitting.  No radicular pain.  No weakness.  No bowel or bladder incontinence.  Going on since 1/13.  No injury to cause the pain, other than plopping down in a hard chair repeatedly.  It is getting better slowly.   Meds, vitals, and allergies reviewed.   ROS: See HPI.  Otherwise, noncontributory.  nad ncat rrr ctab abd soft Back w/o midline pain, SI testing neg, int rotation of hips neg, SLR neg x2 No focal area of tenderness on exam (the pain is deeper and not affected by palpation)

## 2012-04-27 ENCOUNTER — Other Ambulatory Visit: Payer: Self-pay

## 2012-04-27 MED ORDER — TRAMADOL HCL 50 MG PO TABS: 50.0000 mg | ORAL_TABLET | Freq: Four times a day (QID) | ORAL | Status: DC | PRN

## 2012-04-27 NOTE — Telephone Encounter (Signed)
Sent!

## 2012-04-27 NOTE — Telephone Encounter (Signed)
Pt request refill Tramadol 50 mg called to [facility] on [address]. Pt last seen 03/01/12. Pt can be reached at [phone].

## 2012-05-10 ENCOUNTER — Ambulatory Visit (INDEPENDENT_AMBULATORY_CARE_PROVIDER_SITE_OTHER): Payer: Medicare Other | Admitting: Family Medicine

## 2012-05-10 ENCOUNTER — Encounter: Payer: Self-pay | Admitting: Family Medicine

## 2012-05-10 VITALS — BP 140/78 | HR 83 | Temp 98.7°F | Wt 187.0 lb

## 2012-05-10 DIAGNOSIS — IMO0002 Reserved for concepts with insufficient information to code with codable children: Secondary | ICD-10-CM

## 2012-05-10 DIAGNOSIS — T148XXA Other injury of unspecified body region, initial encounter: Secondary | ICD-10-CM

## 2012-05-10 NOTE — Assessment & Plan Note (Signed)
Should gradually heal.  Topical neosporin, keep covered and f/u prn.  Td <5 years ago.

## 2012-05-10 NOTE — Progress Notes (Signed)
She changed her fleece bedding and the skin changes got better.    Bumped a metal stand at a store on 05/03/12.  Needs eval for this today.  Had a skin tear.  Cleaned the wound at the time of the injury.  No fevers, spreading redness, bleeding.   Meds, vitals, and allergies reviewed.   ROS: See HPI.  Otherwise, noncontributory.  nad R shin with 1x1.5cm scab with an L shape border with adherent flap noted.  No FB, pus, erythema.  Minimally ttp.  Scab adherent.

## 2012-05-10 NOTE — Patient Instructions (Signed)
I would put a bandaid and neosporin on it.  You can get in the pool when you can shower without any stinging.  If you have spreading redness, notify us.

## 2012-06-19 ENCOUNTER — Other Ambulatory Visit: Payer: Self-pay

## 2012-06-19 MED ORDER — ACCU-CHEK SOFT TOUCH LANCETS MISC: Status: DC

## 2012-06-19 NOTE — Telephone Encounter (Signed)
Pt request refill lancets to [facility]. Pt notified refilled while on phone.

## 2012-06-20 ENCOUNTER — Encounter: Payer: Self-pay | Admitting: Family Medicine

## 2012-07-25 ENCOUNTER — Other Ambulatory Visit (INDEPENDENT_AMBULATORY_CARE_PROVIDER_SITE_OTHER): Payer: Medicare Other

## 2012-07-25 DIAGNOSIS — E119 Type 2 diabetes mellitus without complications: Secondary | ICD-10-CM

## 2012-07-25 LAB — COMPREHENSIVE METABOLIC PANEL
Albumin: 3.8 g/dL (ref 3.5–5.2)
Alkaline Phosphatase: 59 U/L (ref 39–117)
BUN: 14 mg/dL (ref 6–23)
CO2: 29 mEq/L (ref 19–32)
Calcium: 10.2 mg/dL (ref 8.4–10.5)
Chloride: 106 mEq/L (ref 96–112)
GFR: 90.7 mL/min (ref >60.00)
Glucose, Bld: 98 mg/dL (ref 70–99)
Potassium: 5.2 mEq/L — ABNORMAL HIGH (ref 3.5–5.1)

## 2012-07-25 LAB — HEMOGLOBIN A1C: Hgb A1c MFr Bld: 6.1 % (ref 4.6–6.5)

## 2012-07-25 LAB — LIPID PANEL
Cholesterol: 192 mg/dL (ref 0–200)
VLDL: 19.8 mg/dL (ref 0.0–40.0)

## 2012-07-30 ENCOUNTER — Encounter: Payer: Self-pay | Admitting: Family Medicine

## 2012-07-30 ENCOUNTER — Ambulatory Visit (INDEPENDENT_AMBULATORY_CARE_PROVIDER_SITE_OTHER): Payer: Medicare Other | Admitting: Family Medicine

## 2012-07-30 VITALS — BP 152/84 | HR 88 | Temp 98.7°F | Wt 175.0 lb

## 2012-07-30 DIAGNOSIS — I10 Essential (primary) hypertension: Secondary | ICD-10-CM

## 2012-07-30 DIAGNOSIS — E785 Hyperlipidemia, unspecified: Secondary | ICD-10-CM

## 2012-07-30 DIAGNOSIS — E119 Type 2 diabetes mellitus without complications: Secondary | ICD-10-CM

## 2012-07-30 NOTE — Progress Notes (Signed)
Diabetes:  Using medications without difficulties:yes Hypoglycemic episodes:no Hyperglycemic episodes:no Feet problems:no Blood Sugars averaging: 80-120 She's working hard on diet and water aerobics.   Weight loss noted.   A1c improved to 6.1.   Hypertension:    Using medication without problems or lightheadedness: yes Chest pain with exertion:no Edema:no Short of breath:no Recheck BP 130/80  Elevated Cholesterol: Intolerant of statin Diet compliance:yes Exercise:yes  Aches controlled with tramadol and advil w/o ADE.  Cr wnl.    PMH and SH reviewed.   Vital signs, Meds and allergies reviewed.  ROS: See HPI.  Otherwise nontributory.   GEN: nad, alert and oriented HEENT: mucous membranes moist NECK: supple w/o LA CV: rrr.  no murmur PULM: ctab, no inc wob ABD: soft, +bs EXT: no edema SKIN: no acute rash  Diabetic foot exam: Normal inspection except for chronic changes due to ichthyosis  No skin breakdown No calluses  Normal DP pulses Normal sensation to light tough and monofilament Nails normal except for absent R 1st nail

## 2012-07-30 NOTE — Patient Instructions (Addendum)
If you have sugars that are <80, then cut back to 1 metformin a day. Scheduled a physical for 6 months from now.   Take care.  Keep exercising.  Glad to see you.  I would get a flu shot each fall.

## 2012-07-30 NOTE — Assessment & Plan Note (Signed)
Much improved, weight loss noted- intentional.  Cut back metformin if low sugars.  Doing well.  Recheck at a CPE in ~6 months.  Labs d/w pt.

## 2012-07-30 NOTE — Assessment & Plan Note (Signed)
Statin intolerant, continue diet and exercise.  D/w pt about labs.

## 2012-07-30 NOTE — Assessment & Plan Note (Signed)
Controlled on recheck.  Continue as is.  D/w pt.

## 2012-08-06 ENCOUNTER — Other Ambulatory Visit: Payer: Self-pay | Admitting: *Deleted

## 2012-08-06 MED ORDER — TRAMADOL HCL 50 MG PO TABS: 50.0000 mg | ORAL_TABLET | Freq: Two times a day (BID) | ORAL | Status: DC

## 2012-08-06 NOTE — Telephone Encounter (Signed)
Faxed refill request   

## 2012-08-06 NOTE — Telephone Encounter (Signed)
Sent!

## 2012-08-15 ENCOUNTER — Encounter: Payer: Self-pay | Admitting: Family Medicine

## 2012-08-15 ENCOUNTER — Ambulatory Visit (INDEPENDENT_AMBULATORY_CARE_PROVIDER_SITE_OTHER): Payer: Medicare Other | Admitting: Family Medicine

## 2012-08-15 VITALS — BP 128/72 | HR 84 | Temp 98.7°F | Ht 61.5 in | Wt 175.5 lb

## 2012-08-15 DIAGNOSIS — R7989 Other specified abnormal findings of blood chemistry: Secondary | ICD-10-CM

## 2012-08-15 DIAGNOSIS — R6889 Other general symptoms and signs: Secondary | ICD-10-CM

## 2012-08-15 DIAGNOSIS — N951 Menopausal and female climacteric states: Secondary | ICD-10-CM

## 2012-08-15 DIAGNOSIS — R232 Flushing: Secondary | ICD-10-CM | POA: Insufficient documentation

## 2012-08-15 DIAGNOSIS — E119 Type 2 diabetes mellitus without complications: Secondary | ICD-10-CM

## 2012-08-15 NOTE — Assessment & Plan Note (Signed)
No clear source. Will check CBC and TSH.  No obvious findings on exam today.  This could be due to hypoglycemia and she'll check sugar during and episode.  She agrees.  See notes on labs.

## 2012-08-15 NOTE — Progress Notes (Signed)
Persistent, episodic, irregular hot flashes over last year. Can happen 5-7 times per week.  Lasting about 5-10 minutes.  No sweats, but feels flushed.  No warning before the onset.  No clear triggers.  Had a hysterectomy 1995- TAH/BSO for heavy menses.  No hot flashes before the TAH but did have them afterward.  They then resolved for years.  Now not very bothersome, but she "wanted them checked out".  No meds changes to explain this, tramadol and ibuprofen start were after this.  She hasn't checked her sugar during an episode.  No vaginal bleeding. No fevers, no chills.  Can happen at night or during the day.    Meds, vitals, and allergies reviewed.   ROS: See HPI.  Otherwise, noncontributory.  nad ncat Mmm rrr ctab No TMG, no LA Skin with chronic changes noted

## 2012-08-15 NOTE — Patient Instructions (Addendum)
Go to the lab on the way out.  We'll contact you with your lab report.  Check your sugar during a few episodes and let me know about the results.  If it is <70, that may be causing the episodes.

## 2012-08-16 LAB — CBC WITH DIFFERENTIAL/PLATELET
Basophils Absolute: 0 10*3/uL (ref 0.0–0.1)
Eosinophils Absolute: 0.2 10*3/uL (ref 0.0–0.7)
HCT: 40.1 % (ref 36.0–46.0)
Hemoglobin: 13.1 g/dL (ref 12.0–15.0)
Lymphs Abs: 2 10*3/uL (ref 0.7–4.0)
MCHC: 32.5 g/dL (ref 30.0–36.0)
Monocytes Relative: 10.4 % (ref 3.0–12.0)
Neutro Abs: 3.4 10*3/uL (ref 1.4–7.7)
RDW: 15.5 % — ABNORMAL HIGH (ref 11.5–14.6)

## 2012-08-17 NOTE — Addendum Note (Signed)
Addended by: [name] on: 08/17/2012 06:00 AM   Modules accepted: Orders

## 2012-08-21 ENCOUNTER — Other Ambulatory Visit (INDEPENDENT_AMBULATORY_CARE_PROVIDER_SITE_OTHER): Payer: Medicare Other

## 2012-08-21 DIAGNOSIS — R6889 Other general symptoms and signs: Secondary | ICD-10-CM

## 2012-08-21 DIAGNOSIS — R7989 Other specified abnormal findings of blood chemistry: Secondary | ICD-10-CM

## 2012-08-21 DIAGNOSIS — E119 Type 2 diabetes mellitus without complications: Secondary | ICD-10-CM

## 2012-08-22 LAB — THYROID PEROXIDASE ANTIBODY: Thyroperoxidase Ab SerPl-aCnc: <10 IU/mL (ref <35.0)

## 2012-08-28 NOTE — Addendum Note (Signed)
Addended by: [name] on: 08/28/2012 01:43 PM   Modules accepted: Orders

## 2012-09-26 ENCOUNTER — Other Ambulatory Visit: Payer: Self-pay | Admitting: *Deleted

## 2012-09-26 MED ORDER — AMLODIPINE BESYLATE 5 MG PO TABS: 5.0000 mg | ORAL_TABLET | Freq: Every day | ORAL | Status: DC

## 2012-10-02 ENCOUNTER — Encounter: Payer: Self-pay | Admitting: Family Medicine

## 2012-10-02 ENCOUNTER — Ambulatory Visit (INDEPENDENT_AMBULATORY_CARE_PROVIDER_SITE_OTHER): Payer: Medicare Other | Admitting: Family Medicine

## 2012-10-02 VITALS — BP 144/76 | HR 81 | Temp 98.8°F | Wt 174.0 lb

## 2012-10-02 DIAGNOSIS — R071 Chest pain on breathing: Secondary | ICD-10-CM

## 2012-10-02 DIAGNOSIS — R0789 Other chest pain: Secondary | ICD-10-CM

## 2012-10-02 DIAGNOSIS — Z1231 Encounter for screening mammogram for malignant neoplasm of breast: Secondary | ICD-10-CM

## 2012-10-02 NOTE — Patient Instructions (Addendum)
The back pain should get better (slowly).  See Shirlee Limerick about your referral before you leave today. Take care.   Glad to see you.

## 2012-10-02 NOTE — Progress Notes (Signed)
She has a "pulling" in her back with deep breath or movement.  No fevers, chills, ST, cough.  No sputum.  No ear pain, no rash.  No vomiting, diarrhea.  Not wheezing. Former smoker, quit almost 20 years ago.  L lateral lower T spine area, posterior to midaxillary line.  She feels well o/w.  Still able to exercise at baseline.    Meds, vitals, and allergies reviewed.   ROS: See HPI.  Otherwise, noncontributory.  nad ncat Tm wnl Nasal and OP exam wnl Neck supple, no LA rrr ctab Pain with deep breath, improved with external pressure applied to  L lateral lower T spine area, posterior to midaxillary line.

## 2012-10-03 DIAGNOSIS — Z1231 Encounter for screening mammogram for malignant neoplasm of breast: Secondary | ICD-10-CM | POA: Insufficient documentation

## 2012-10-03 DIAGNOSIS — R0789 Other chest pain: Secondary | ICD-10-CM | POA: Insufficient documentation

## 2012-10-03 NOTE — Assessment & Plan Note (Signed)
No masses, lumps, discharge per patient but due for mammogram.  F/u encouraged.  She agreed.  Will refer.

## 2012-10-03 NOTE — Assessment & Plan Note (Signed)
Likely benign MSK source, possibly intercostal muscle strain.  Should resolve.  Nontoxic and no findings on exam to suggest alternative dx.  F/u prn.

## 2012-11-01 ENCOUNTER — Other Ambulatory Visit: Payer: Self-pay | Admitting: *Deleted

## 2012-11-01 MED ORDER — TRAMADOL HCL 50 MG PO TABS
50.0000 mg | ORAL_TABLET | Freq: Two times a day (BID) | ORAL | Status: DC
Start: 2012-11-01 — End: 2013-03-12

## 2012-11-01 NOTE — Telephone Encounter (Signed)
Sent!

## 2012-11-02 ENCOUNTER — Ambulatory Visit: Payer: Medicare Other

## 2012-11-14 ENCOUNTER — Other Ambulatory Visit: Payer: Self-pay | Admitting: *Deleted

## 2012-11-14 MED ORDER — ENALAPRIL MALEATE 20 MG PO TABS: 20.0000 mg | ORAL_TABLET | Freq: Every day | ORAL | Status: DC

## 2013-01-09 ENCOUNTER — Telehealth: Payer: Self-pay | Admitting: Family Medicine

## 2013-01-09 NOTE — Telephone Encounter (Signed)
Patient Information:  Caller Name: Jacelynn  Phone: [phone]  Patient: Natasha Woodard, Natasha Woodard  Gender: Female  DOB: 1941-10-28  Age: 72 Years  PCP: Crawford Givens Clelia Croft) [facility])  Office Follow Up:  Does the office need to follow up with this patient?: No  Instructions For The Office: N/A  RN Note:  Has 1.5 inch area on right anterior calf with a non-draining itchy wound. Developed as result of picking dry skin.  Fasting blood sugar 81. Tiny blisters present around outside of wound with redness. Unable to specify amount of redness.  Declined to schedule; will try topical antibiotic.  Advised of risk of septicemia if infection goes untreated.  Symptoms  Reason For Call & Symptoms: Reqeusted topical antibiotic for painful infection on front of right calf  Reviewed Health History In EMR: Yes  Reviewed Medications In EMR: Yes  Reviewed Allergies In EMR: Yes  Reviewed Surgeries / Procedures: Yes  Date of Onset of Symptoms: 01/06/2013  Treatments Tried: Lamisil, Hydrogen Peroxide, Neosporin  Treatments Tried Worked: No  Guideline(s) Used:  Skin Injury  Wound Infection  Disposition Per Guideline:   See Today in Office  Reason For Disposition Reached:   Skin redness around the wound larger than 2 inches (5 cm)  Advice Given:  Antibiotic Ointment:  Apply an antibiotic ointment 3 times a day. If the area could become dirty, cover with a Band-Aid or a clean gauze dressing.  Antibiotic Ointment:  Apply an antibiotic ointment 3 times a day. If the area could become dirty, cover with a Band-Aid or a clean gauze dressing.  Warm Soaks or Local Heat:  If the wound is open, soak it in warm water or put a warm wet cloth on the wound for 20 minutes 3 times per day. Use a warm saltwater solution containing 2 teaspoons of table salt per quart of water. If the wound is closed, apply a heating pad or warm, moist washcloth to the reddened area for 20 minutes 3 times per day.  Warm Soaks or  Local Heat:  If the wound is open, soak it in warm water or put a warm wet cloth on the wound for 20 minutes 3 times per day. Use a warm saltwater solution containing 2 teaspoons of table salt per quart of water. If the wound is closed, apply a heating pad or warm, moist washcloth to the reddened area for 20 minutes 3 times per day.  Patient Refused Recommendation:  Patient Refused Appt, Patient Requests Appt At Later Date  Will try use of otc topical antibiotic 1st.

## 2013-01-18 ENCOUNTER — Encounter: Payer: Self-pay | Admitting: Family Medicine

## 2013-01-18 ENCOUNTER — Ambulatory Visit (INDEPENDENT_AMBULATORY_CARE_PROVIDER_SITE_OTHER): Payer: Medicare Other | Admitting: Family Medicine

## 2013-01-18 VITALS — BP 142/80 | HR 79 | Temp 98.4°F | Wt 176.8 lb

## 2013-01-18 DIAGNOSIS — L0100 Impetigo, unspecified: Secondary | ICD-10-CM

## 2013-01-18 MED ORDER — DOXYCYCLINE HYCLATE 100 MG PO TABS: 100.0000 mg | ORAL_TABLET | Freq: Two times a day (BID) | ORAL | Status: DC

## 2013-01-18 NOTE — Progress Notes (Signed)
R shin lesion started about 1.5 weeks ago.  She has flaking skin at baseline and she was trying to get the flake off.  She accidentally pulled some adjacent skin.  She called in and started using topical neosporin, local heat and salt water compresses.  Since then, it itches but putting neosporin on helps with the itching.  It isn't very painful.  She hasn't had allergy to neosporin prev.  The area of skin irritation had enlarged and had some blistering/crusting before she put the neosporin.    Meds, vitals, and allergies reviewed.   ROS: See HPI.  Otherwise, noncontributory.  nad ncat ichthyosis at baseline but R anterior shin with 16x9 area with scattered crusted pustules w/o fluctuant mass Centrally with ~1cm linear disruption, superficial, where she had pulled the skin.

## 2013-01-18 NOTE — Patient Instructions (Signed)
Use nonstick pads with roll gauze.  Change once a day, more often if needed.  Stop using the neosporin.  Start the doxycycline and let me know if this doesn't improve.  Take care.

## 2013-01-19 DIAGNOSIS — L0100 Impetigo, unspecified: Secondary | ICD-10-CM | POA: Insufficient documentation

## 2013-01-19 NOTE — Assessment & Plan Note (Signed)
Start doxy, use nonstick pads, cover and f/u prn. Superficial, should resolve.  F/u prn.  No fevers or systemic sx.  Call back as needed.

## 2013-01-22 ENCOUNTER — Telehealth: Payer: Self-pay | Admitting: *Deleted

## 2013-01-22 NOTE — Telephone Encounter (Signed)
Patient calls in with 3 questions: 1) When does she need to schedule her next A1C and thyroid labs? 2) Can she leave her leg wound open during the day when she is at home alone?  I told her I thought you recommended keeping it covered to prevent germs or bacteria from entering the wound.  She says she thought it was to keep her from spreading it to other people but she is at home alone during the day. 3) She again is asking if she can put something on it in terms of ointment/cream, etc because she says it is very dry and crusty looking.

## 2013-01-22 NOTE — Telephone Encounter (Signed)
1. Labs in March.  OV a few days later.  2. It is reasonable to keep it covered it around others and most of the time at home.  3. I would use plain hand cream for the dry skin.   Thanks.

## 2013-01-22 NOTE — Telephone Encounter (Signed)
Patient advised.

## 2013-01-26 ENCOUNTER — Other Ambulatory Visit: Payer: Self-pay

## 2013-02-05 ENCOUNTER — Other Ambulatory Visit: Payer: Medicare Other

## 2013-02-26 ENCOUNTER — Other Ambulatory Visit: Payer: Self-pay | Admitting: Family Medicine

## 2013-03-05 ENCOUNTER — Other Ambulatory Visit: Payer: Medicare Other

## 2013-03-07 ENCOUNTER — Other Ambulatory Visit (INDEPENDENT_AMBULATORY_CARE_PROVIDER_SITE_OTHER): Payer: Medicare Other

## 2013-03-07 DIAGNOSIS — E1059 Type 1 diabetes mellitus with other circulatory complications: Secondary | ICD-10-CM

## 2013-03-07 DIAGNOSIS — E119 Type 2 diabetes mellitus without complications: Secondary | ICD-10-CM

## 2013-03-07 LAB — TSH: TSH: 0.65 u[IU]/mL (ref 0.35–5.50)

## 2013-03-08 ENCOUNTER — Other Ambulatory Visit: Payer: Self-pay | Admitting: *Deleted

## 2013-03-08 MED ORDER — METFORMIN HCL 500 MG PO TABS: 500.0000 mg | ORAL_TABLET | Freq: Two times a day (BID) | ORAL | Status: DC

## 2013-03-12 ENCOUNTER — Ambulatory Visit (INDEPENDENT_AMBULATORY_CARE_PROVIDER_SITE_OTHER): Payer: Medicare Other | Admitting: Family Medicine

## 2013-03-12 ENCOUNTER — Encounter: Payer: Self-pay | Admitting: Family Medicine

## 2013-03-12 VITALS — BP 130/76 | HR 78 | Temp 98.7°F | Wt 177.2 lb

## 2013-03-12 DIAGNOSIS — E119 Type 2 diabetes mellitus without complications: Secondary | ICD-10-CM

## 2013-03-12 MED ORDER — TRAMADOL HCL 50 MG PO TABS: 50.0000 mg | ORAL_TABLET | Freq: Two times a day (BID) | ORAL | Status: DC

## 2013-03-12 NOTE — Progress Notes (Signed)
Diabetes:  Using medications without difficulties:yes Hypoglycemic episodes:no Hyperglycemic episodes:no Feet problems: no Blood Sugars averaging:  100-130 in AM eye exam within last year: yes Exercise was limited with recent bad weather.  A1c 6.2.  Labs discussed.  TSH wnl.   Meds, vitals, and allergies reviewed.   ROS: See HPI.  Otherwise negative.    GEN: nad, alert and oriented HEENT: mucous membranes moist NECK: supple w/o LA CV: rrr. PULM: ctab, no inc wob ABD: soft, +bs EXT: no edema SKIN: no acute rash but chronic changes noted  Diabetic foot exam: Normal inspection No skin breakdown No calluses  Normal DP pulses Normal sensation to light touch and monofilament Nails normal

## 2013-03-12 NOTE — Patient Instructions (Addendum)
Don't change your meds.  Recheck in 6 months at a physical with labs ahead of time.  Keep working on [employer] and exercise.   Glad to see you.

## 2013-03-13 NOTE — Assessment & Plan Note (Signed)
Continue to work on diet and exercise.  Recheck at a CPE later in 2014.  Labs d/w pt.  She agrees. F/u prn in meantime.

## 2013-03-21 ENCOUNTER — Other Ambulatory Visit: Payer: Self-pay | Admitting: *Deleted

## 2013-03-21 MED ORDER — GLUCOSE BLOOD VI STRP: ORAL_STRIP | Status: DC

## 2013-05-09 ENCOUNTER — Encounter: Payer: Self-pay | Admitting: Family Medicine

## 2013-05-09 ENCOUNTER — Ambulatory Visit (INDEPENDENT_AMBULATORY_CARE_PROVIDER_SITE_OTHER): Payer: Medicare Other | Admitting: Family Medicine

## 2013-05-09 VITALS — BP 130/72 | HR 89 | Temp 98.6°F | Ht 61.5 in | Wt 178.5 lb

## 2013-05-09 DIAGNOSIS — R0989 Other specified symptoms and signs involving the circulatory and respiratory systems: Secondary | ICD-10-CM

## 2013-05-09 DIAGNOSIS — K121 Other forms of stomatitis: Secondary | ICD-10-CM

## 2013-05-09 DIAGNOSIS — K1370 Unspecified lesions of oral mucosa: Secondary | ICD-10-CM

## 2013-05-09 MED ORDER — TRIAMCINOLONE ACETONIDE 0.1 % EX OINT: TOPICAL_OINTMENT | Freq: Two times a day (BID) | CUTANEOUS | Status: DC

## 2013-05-09 MED ORDER — MAGIC MOUTHWASH W/LIDOCAINE: 5.0000 mL | Freq: Four times a day (QID) | ORAL | Status: DC | PRN

## 2013-05-09 NOTE — Progress Notes (Signed)
[facility] at [facility] [address] [place] [address] Phone: [phone] Fax: [phone]  Date:  05/09/2013   Name:  Natasha Woodard   DOB:  Apr 17, 1941   MRN:  [id number] Gender: female Age: 72 y.o.  Primary Physician:  Crawford Givens, MD  Evaluating MD: Hannah Beat, MD   Chief Complaint: Mouth Lesions and GI Problem   History of Present Illness:  Natasha Woodard is a 72 y.o. pleasant patient who presents with the following:  Mouth full of sores and for the last month and has been sleeping with the windows open. Coughing up some phlegm. Was told could have a mild case of emphysema by a doctor in the past. Now, with more congestion and coughing up some intermittent sputum.  Patient Active Problem List   Diagnosis Date Noted  . Impetigo 01/19/2013  . Chest wall pain 10/03/2012  . Other screening mammogram 10/03/2012  . Hot flashes 08/15/2012  . Skin tear 05/10/2012  . Buttock pain 01/31/2012  . Hip pain 08/11/2011  . KNEE PAIN, LEFT 11/09/2010  . PULMONARY NODULE 06/07/2010  . DIABETES MELLITUS, TYPE II 05/19/2010  . HYPERLIPIDEMIA 05/19/2010  . OBESITY 05/19/2010  . HYPERTENSION 05/19/2010  . EMPHYSEMA 05/19/2010  . HIATAL HERNIA WITH REFLUX 05/19/2010  . OTHER CONGENITAL ANOMALY GALLBLADDER BDS&LIVER 05/19/2010  . ICHTHYOSIS CONGENITA 05/19/2010    Past Medical History  Diagnosis Date  . Hypertension   . Hyperlipidemia     Statin intolerant  . Diabetes mellitus     Type II  . Hiatal hernia     with reflux  . Emphysema of lung   . Ichthyosis congenita   . Obesity   . Pulmonary nodule     Imaged multiple times and benign appearing  . Plantar fasciitis     Left  . NSVD (normal spontaneous vaginal delivery) 1971 & 1972  . Calcific tendonitis 2008    Treatment of    Past Surgical History  Procedure Laterality Date  . Abdominal hysterectomy  1975    TAH-BSO  . Corn removal  1968  . Abscess drainage  1972    History    Social History  . Marital Status: Married    Spouse Name: N/A    Number of Children: 2  . Years of Education: N/A   Occupational History  .     Social History Main Topics  . Smoking status: Former Smoker -- 28 years    Types: Cigarettes    Quit date: 12/12/1993  . Smokeless tobacco: Never Used  . Alcohol Use: No  . Drug Use: No  . Sexually Active: Not on file   Other Topics Concern  . Not on file   Social History Narrative   Two grandchildren are well.    Family History  Problem Relation Age of Onset  . Heart disease Mother   . Heart disease Father   . Heart disease Brother     MI  . Cancer Brother     Lung    Allergies  Allergen Reactions  . Ezetimibe-Simvastatin     REACTION: Muscle aches (side effect)  . Pravastatin Sodium     REACTION: Muscle aches (side effect)  . Sulfonamide Derivatives     Medication list has been reviewed and updated.  Outpatient Prescriptions Prior to Visit  Medication Sig Dispense Refill  . amLODipine (NORVASC) 5 MG tablet Take 1 tablet (5 mg total) by mouth daily.  90 tablet  3  . aspirin 81  MG tablet Take 81 mg by mouth daily.        . Cholecalciferol (VITAMIN D) 1000 UNITS capsule Take 1,000 Units by mouth daily.       . enalapril (VASOTEC) 20 MG tablet Take 1 tablet (20 mg total) by mouth daily.  90 tablet  1  . glucose blood (ACCU-CHEK AVIVA) test strip Test blood sugar once daily. Dx 250.00  100 each  3  . ibuprofen (ADVIL,MOTRIN) 200 MG tablet Take 400 mg by mouth 2 (two) times daily.       . Lancets (ACCU-CHEK SOFT TOUCH) lancets Test blood sugar once daily.250.00  100 each  3  . metFORMIN (GLUCOPHAGE) 500 MG tablet Take 1 tablet (500 mg total) by mouth 2 (two) times daily with a meal. take 1 tab twice a day.  180 tablet  1  . traMADol (ULTRAM) 50 MG tablet Take 1 tablet (50 mg total) by mouth 2 (two) times daily.  180 tablet  1  . Multiple Vitamin (MULTIVITAMIN PO) Take by mouth daily.         No facility-administered  medications prior to visit.    Review of Systems:   GEN: No acute illnesses, no fevers, chills. GI: No n/v/d, eating normally Pulm: No SOB Interactive and getting along well at home.  Otherwise, ROS is as per the HPI.   Physical Examination: BP 130/72  Pulse 89  Temp(Src) 98.6 F (37 C) (Oral)  Ht 5' 1.5" (1.562 m)  Wt 178 lb 8 oz (80.967 kg)  BMI 33.19 kg/m2  SpO2 97%  Ideal Body Weight: Weight in (lb) to have BMI = 25: 134.2   GEN: WDWN, NAD, Non-toxic, A & O x 3 HEENT: Atraumatic, Normocephalic. Neck supple. No masses, No LAD. 4-5 small barely visible ulcers. Ears and Nose: No external deformity. CV: RRR, No M/G/R. No JVD. No thrill. No extra heart sounds. PULM: CTA B, no wheezes, crackles, rhonchi. No retractions. No resp. distress. No accessory muscle use. EXTR: No c/c/e NEURO Normal gait.  PSYCH: Normally interactive. Conversant. Not depressed or anxious appearing.  Calm demeanor.    Assessment and Plan:  Mouth ulcers  Chest congestion  Alternate TAC and orajel bid with ulcers  "congestion" 2 mo - trial of otc claritin or allegra At next check up with PCP, I would consider pre and post spirometry to eval for potential copd  Orders Today:  No orders of the defined types were placed in this encounter.    Updated Medication List: (Includes new medications, updates to list, dose adjustments) Meds ordered this encounter  Medications  . triamcinolone ointment (KENALOG) 0.1 %    Sig: Apply topically 2 (two) times daily.    Dispense:  30 g    Refill:  0  . Alum & Mag Hydroxide-Simeth (MAGIC MOUTHWASH W/LIDOCAINE) SOLN    Sig: Take 5 mLs by mouth 4 (four) times daily as needed.    Dispense:  240 mL    Refill:  0    Medications Discontinued: Medications Discontinued During This Encounter  Medication Reason  . Multiple Vitamin (MULTIVITAMIN PO) Error      Signed, Pennye Beeghly T. Latrisha Coiro, MD 05/09/2013 12:54 PM

## 2013-05-09 NOTE — Patient Instructions (Addendum)
Triamcinalone 1st Then cover with Orabase or Orajel

## 2013-05-10 ENCOUNTER — Telehealth: Payer: Self-pay | Admitting: *Deleted

## 2013-05-10 MED ORDER — FIRST-DUKES MOUTHWASH MT SUSP: 5.0000 mL | Freq: Four times a day (QID) | OROMUCOSAL | Status: DC | PRN

## 2013-05-10 NOTE — Telephone Encounter (Signed)
Yes that is okay  

## 2013-05-10 NOTE — Telephone Encounter (Signed)
Pharmacy wants to know if they can substitute magic mouthwash for Dukes mouthwash with lidocaine?

## 2013-05-10 NOTE — Telephone Encounter (Signed)
rx for dukes mouth wash sent it

## 2013-05-13 ENCOUNTER — Ambulatory Visit (INDEPENDENT_AMBULATORY_CARE_PROVIDER_SITE_OTHER): Payer: Medicare Other | Admitting: Family Medicine

## 2013-05-13 ENCOUNTER — Encounter: Payer: Self-pay | Admitting: Family Medicine

## 2013-05-13 ENCOUNTER — Telehealth: Payer: Self-pay | Admitting: Family Medicine

## 2013-05-13 VITALS — BP 148/82 | HR 88 | Temp 98.2°F | Wt 178.0 lb

## 2013-05-13 DIAGNOSIS — K130 Diseases of lips: Secondary | ICD-10-CM

## 2013-05-13 DIAGNOSIS — R220 Localized swelling, mass and lump, head: Secondary | ICD-10-CM

## 2013-05-13 MED ORDER — CEPHALEXIN 500 MG PO CAPS: 500.0000 mg | ORAL_CAPSULE | Freq: Three times a day (TID) | ORAL | Status: DC

## 2013-05-13 MED ORDER — VALACYCLOVIR HCL 1 G PO TABS: 2000.0000 mg | ORAL_TABLET | Freq: Two times a day (BID) | ORAL | Status: DC

## 2013-05-13 NOTE — Progress Notes (Addendum)
  Subjective:    Patient ID: Natasha Woodard, female    DOB: 1941/07/15, 72 y.o.   MRN: [id number]  HPI CC: lip swelling  Noted lip swelling last week.  Started very small - with mouth sores.  This improved, however now noticing lip swelling that has gotten progressively worse.  Denies tongue or throat swelling.  Denies rash, dyspnea or chest pain/tightness. No h/o cold sores or fever blisters.  Was sick last weekend - GI upset, nausea, cough productive of phlegm, fever.    On ACEI - enalapril.  Has been on enalapril for last 6 years.  H/o diabetes and HTN. Lab Results  Component Value Date   HGBA1C 6.2 03/07/2013    Lab Results  Component Value Date   CREATININE 0.8 07/25/2012    Past Medical History  Diagnosis Date  . Hypertension   . Hyperlipidemia     Statin intolerant  . Diabetes mellitus     Type II  . Hiatal hernia     with reflux  . Emphysema of lung   . Ichthyosis congenita   . Obesity   . Pulmonary nodule     Imaged multiple times and benign appearing  . Plantar fasciitis     Left  . NSVD (normal spontaneous vaginal delivery) 1971 & 1972  . Calcific tendonitis 2008    Treatment of     Review of Systems Per HPI    Objective:   Physical Exam  Nursing note and vitals reviewed. Constitutional: She appears well-developed and well-nourished. No distress.  HENT:  No oral lesions Upper >> lower lip swelling present along with eyrthema, honey colored crusting evident on upper lip as well as papules on vermillion border and superior to upper lip  Musculoskeletal: She exhibits no edema.       Assessment & Plan:

## 2013-05-13 NOTE — Telephone Encounter (Signed)
Patient Information:  Caller Name: Akeira  Phone: [phone]  Patient: Natasha Woodard, Natasha Woodard  Gender: Female  DOB: 1941-05-27  Age: 72 Years  PCP: Crawford Givens Clelia Croft) [facility])  Office Follow Up:  Does the office need to follow up with this patient?: No  Instructions For The Office: N/A  RN Note:  pt rates pain as 2/10. Pt states she was seen in the office for same symptoms on 05/10/13.  Pt has appt scheduled already for 1600 with Dr Para March on 05/14/13  Symptoms  Reason For Call & Symptoms: top lip is swollen and has bumps on.  Caller also reports that the inside of her mouth has sore  Reviewed Health History In EMR: Yes  Reviewed Medications In EMR: Yes  Reviewed Allergies In EMR: Yes  Reviewed Surgeries / Procedures: Yes  Date of Onset of Symptoms: 05/06/2013  Guideline(s) Used:  Face Pain  Cold Sores - Fever Blisters of Lip  Disposition Per Guideline:   See Today or Tomorrow in Office  Reason For Disposition Reached:   Patient wants to be seen  Advice Given:  N/A  Patient Will Follow Care Advice:  YES

## 2013-05-13 NOTE — Patient Instructions (Signed)
I'd like to treat you with valtrex and keflex 500mg  three times daily to cover both bacterial and viral infection. Please let us know how you are doing with this.  If any worsening, let us know. Good to see you today, call us with questions

## 2013-05-13 NOTE — Telephone Encounter (Signed)
Will see tomorrow

## 2013-05-13 NOTE — Assessment & Plan Note (Signed)
Doubt angioedema - have continued enalapril for now. Anticipate more viral prodrome (like HSV with recent GI sxs) vs bacterial impetigo. Will treat for both with keflex course tid x10 days as well as valtrex course for herpes labialis. Pt agrees.  Red flags to seek urgent care discussed - emphasizing need for re evaluation if any worsening or angioedema sxs.

## 2013-05-14 ENCOUNTER — Ambulatory Visit: Payer: Medicare Other | Admitting: Family Medicine

## 2013-05-15 ENCOUNTER — Telehealth: Payer: Self-pay | Admitting: Family Medicine

## 2013-05-15 MED ORDER — MUPIROCIN 2 % EX OINT: TOPICAL_OINTMENT | Freq: Three times a day (TID) | CUTANEOUS | Status: DC

## 2013-05-15 NOTE — Telephone Encounter (Signed)
I would add on bactroban ointment TID (rx sent) w/o a bandage, hold the enalapril for now, and have her rechecked with me tomorrow in clinic.  Continue other meds for now.  This is assuming she is not having any airway symptoms, tongue swelling or dysphagia. If she does, to ER.  Thanks.

## 2013-05-15 NOTE — Telephone Encounter (Signed)
Patient Information:  Caller Name: Maisa  Phone: [phone]  Patient: Natasha Woodard, Natasha Woodard  Gender: Female  DOB: 1941-06-05  Age: 72 Years  PCP: Crawford Givens Clelia Croft) [facility])  Office Follow Up:  Does the office need to follow up with this patient?: Yes  Instructions For The Office: OFFICE CAN YOU PLEASE FOLLOW UP WITH PT IF SHE CAN BE SEEN IN THE OFFICE TODAY.. OR WHAT THE MD WANTS HER TO DO.  PT WAS IN THE OFFICE ON 05/13/13 AND SYMPTOMS ARE WORSE  RN Note:  blisters are covering the entire upper lip and above upper lip.  Caller states her lip is still swollen.  Symptoms  Reason For Call & Symptoms: pt reports that she was seen in the office on 05/13/13.  Pt reports that she is not getting better.  Pt states blisters have increased on top lip and there are blisters on bottom lip.  Pt also reports that there is crusts on both lips.  Pt does not know if she needs to wash them or put ointment on them  Reviewed Health History In EMR: Yes  Reviewed Medications In EMR: Yes  Reviewed Allergies In EMR: Yes  Reviewed Surgeries / Procedures: Yes  Date of Onset of Symptoms: 05/12/2013  Treatments Tried: Keflex, Valtrex  Treatments Tried Worked: No  Guideline(s) Used:  Cold Sores - Fever Blisters of Lip  Disposition Per Guideline:   See Today in Office  Reason For Disposition Reached:   New sores occur in another area  Advice Given:  N/A  Patient Refused Recommendation:  Patient Refused Care Advice  Pt is not sure if she needs to come back.  She would like to come back in so Dr Reece Agar or Dr Para March could see it but all appts were full.  Pt states the bumps are worse and spreading.

## 2013-05-15 NOTE — Telephone Encounter (Signed)
.  left message to have patient return my call.  

## 2013-05-15 NOTE — Telephone Encounter (Signed)
Pt called back. See previous notes in [employer]. RN advised pt that Dr. Para March had called in Bactroban TID to be applied to blisters. No bandage. Pt advised to hold the ENalapril for now and then scheduled appt at 3:00 tomorrow with Dr. Para March.She did deny any airway sx/no tongue swelling or dysphagia.

## 2013-05-16 ENCOUNTER — Encounter: Payer: Self-pay | Admitting: Family Medicine

## 2013-05-16 ENCOUNTER — Ambulatory Visit (INDEPENDENT_AMBULATORY_CARE_PROVIDER_SITE_OTHER): Payer: Medicare Other | Admitting: Family Medicine

## 2013-05-16 VITALS — BP 130/82 | HR 88 | Temp 98.3°F | Wt 173.5 lb

## 2013-05-16 DIAGNOSIS — K130 Diseases of lips: Secondary | ICD-10-CM

## 2013-05-16 DIAGNOSIS — R220 Localized swelling, mass and lump, head: Secondary | ICD-10-CM

## 2013-05-16 MED ORDER — FLUCONAZOLE 150 MG PO TABS: 150.0000 mg | ORAL_TABLET | Freq: Once | ORAL | Status: DC

## 2013-05-16 NOTE — Assessment & Plan Note (Signed)
Less pain, off ACE for now.  Doubt angioedema. Was likely viral, treated, but would continue abx PO and topical for now.  Call back with update next week.  Warm compresses in meantime.  Nontoxic.Marland Kitchen Should resolve.  She agrees.

## 2013-05-16 NOTE — Progress Notes (Signed)
Prev notes reviewed.  Now on bactroban and keflex.  Done with valtrex.  HSV culture neg.  No fevers. Feels well except for her lips.  No stridor, dysphagia.  Off ACE for now.  Inc in crusting on the lips.   Meds, vitals, and allergies reviewed.   ROS: See HPI.  Otherwise, noncontributory.  nad ncat except for lip edema x2 OP exam wnl Lips with crusting on the upper > lower lip.  This is removed partially with a q tip.  Underlying lip tissue minimally irritated. No fluctuant mass in the lips.

## 2013-05-16 NOTE — Patient Instructions (Signed)
Stay off the enalapril for now.  Warm compresses before applying the bactroban ointment (don't scrub).  Use the ointment until you are done with the keflex pills.  Use the diflucan if needed for a yeast infection.  Call me with an update next week.  Take care.

## 2013-05-21 ENCOUNTER — Other Ambulatory Visit: Payer: Self-pay | Admitting: Family Medicine

## 2013-05-21 ENCOUNTER — Telehealth: Payer: Self-pay | Admitting: Family Medicine

## 2013-05-21 MED ORDER — VALACYCLOVIR HCL 1 G PO TABS: 2000.0000 mg | ORAL_TABLET | Freq: Two times a day (BID) | ORAL | Status: DC

## 2013-05-21 NOTE — Telephone Encounter (Signed)
Pt came by the office.  Recheck lips totally normal.  She feels well.  She is happy about the situation.   D/w pt.  Likely viral. Would use valtex if another episode.  Would restart ACE.  D/c other abx.   She agrees.  F/u prn.

## 2013-05-31 ENCOUNTER — Ambulatory Visit (INDEPENDENT_AMBULATORY_CARE_PROVIDER_SITE_OTHER): Payer: Medicare Other | Admitting: Family Medicine

## 2013-05-31 DIAGNOSIS — E119 Type 2 diabetes mellitus without complications: Secondary | ICD-10-CM

## 2013-06-10 NOTE — Patient Instructions (Signed)
Spent approx 20 min educating pt on checking blood glucose and glucometer use.

## 2013-06-17 ENCOUNTER — Telehealth: Payer: Self-pay

## 2013-06-17 DIAGNOSIS — E119 Type 2 diabetes mellitus without complications: Secondary | ICD-10-CM

## 2013-06-17 DIAGNOSIS — Z8639 Personal history of other endocrine, nutritional and metabolic disease: Secondary | ICD-10-CM

## 2013-06-17 NOTE — Telephone Encounter (Signed)
1000 units per day is good. Thanks.

## 2013-06-17 NOTE — Telephone Encounter (Signed)
Patient is very concerned about how much Vitamin D she should take.  She says some time ago, she was placed on 2,000 units per day and somehow along the way she went off of it and wants to know now if she should be on more than 1,000.  She is requesting the lab test to get her level.  Please advise.

## 2013-06-17 NOTE — Telephone Encounter (Signed)
She had prev been adequate when tested and 1000 per day should be enough to maintain that.  We can test it when she is coming in for labs again, but I wouldn't make a special trip for that now.

## 2013-06-17 NOTE — Telephone Encounter (Signed)
Pt wants to verify how many units of Vit D 3 pt should be taking. (pts med list has Vit D 1000 units daily)Please advise.

## 2013-06-18 NOTE — Telephone Encounter (Signed)
Patient advised.

## 2013-07-02 LAB — HM DIABETES EYE EXAM

## 2013-07-16 ENCOUNTER — Telehealth: Payer: Self-pay

## 2013-07-16 MED ORDER — FLUCONAZOLE 150 MG PO TABS: 150.0000 mg | ORAL_TABLET | Freq: Once | ORAL | Status: DC

## 2013-07-16 NOTE — Telephone Encounter (Signed)
Clarify this for me- I need to know the details on this rash.  If this is the rash she has with the lip swelling in 6/14, it wouldn't typically be on her breast, legs and vaginal area.

## 2013-07-16 NOTE — Telephone Encounter (Signed)
Patient states this is not the same as when her lips were swelling.  She says you prescribed something since then when she had a rash in the vaginal area and now she has it inside one of her legs and under one breast.

## 2013-07-16 NOTE — Telephone Encounter (Signed)
Pt left v/m; pt was seen 05/2013 with rash which cleared up after treatment; but rash has returned to under breast, legs and vaginal area. No vaginal discharge. Pt request stronger med without having to come in for appt. [facility] [place]. Pt request cb.

## 2013-07-16 NOTE — Telephone Encounter (Signed)
I called her.  She had improved on diflucan.  Will try again.  D/w pt about keeping areas dry.

## 2013-07-17 ENCOUNTER — Other Ambulatory Visit: Payer: Self-pay

## 2013-07-23 ENCOUNTER — Other Ambulatory Visit (INDEPENDENT_AMBULATORY_CARE_PROVIDER_SITE_OTHER): Payer: Medicare Other

## 2013-07-23 DIAGNOSIS — Z8639 Personal history of other endocrine, nutritional and metabolic disease: Secondary | ICD-10-CM

## 2013-07-23 DIAGNOSIS — E119 Type 2 diabetes mellitus without complications: Secondary | ICD-10-CM

## 2013-07-23 LAB — COMPREHENSIVE METABOLIC PANEL
Albumin: 3.9 g/dL (ref 3.5–5.2)
BUN: 14 mg/dL (ref 6–23)
Calcium: 9.9 mg/dL (ref 8.4–10.5)
Chloride: 106 mEq/L (ref 96–112)
Creatinine, Ser: 0.8 mg/dL (ref 0.4–1.2)
GFR: 95.96 mL/min (ref >60.00)
Glucose, Bld: 97 mg/dL (ref 70–99)
Potassium: 4.8 mEq/L (ref 3.5–5.1)

## 2013-07-23 LAB — LIPID PANEL
Cholesterol: 200 mg/dL (ref 0–200)
Triglycerides: 96 mg/dL (ref 0.0–149.0)

## 2013-07-24 LAB — VITAMIN D 25 HYDROXY (VIT D DEFICIENCY, FRACTURES): Vit D, 25-Hydroxy: 60 ng/mL (ref 30–89)

## 2013-07-29 ENCOUNTER — Encounter: Payer: Self-pay | Admitting: Family Medicine

## 2013-07-30 ENCOUNTER — Encounter: Payer: Self-pay | Admitting: Gastroenterology

## 2013-07-30 ENCOUNTER — Encounter: Payer: Self-pay | Admitting: Family Medicine

## 2013-07-30 ENCOUNTER — Ambulatory Visit (INDEPENDENT_AMBULATORY_CARE_PROVIDER_SITE_OTHER): Payer: Medicare Other | Admitting: Family Medicine

## 2013-07-30 VITALS — BP 142/78 | HR 71 | Temp 98.7°F | Ht 63.0 in | Wt 176.0 lb

## 2013-07-30 DIAGNOSIS — Z0000 Encounter for general adult medical examination without abnormal findings: Secondary | ICD-10-CM

## 2013-07-30 DIAGNOSIS — Z1231 Encounter for screening mammogram for malignant neoplasm of breast: Secondary | ICD-10-CM

## 2013-07-30 DIAGNOSIS — E119 Type 2 diabetes mellitus without complications: Secondary | ICD-10-CM

## 2013-07-30 DIAGNOSIS — Q809 Congenital ichthyosis, unspecified: Secondary | ICD-10-CM

## 2013-07-30 DIAGNOSIS — Z1211 Encounter for screening for malignant neoplasm of colon: Secondary | ICD-10-CM

## 2013-07-30 DIAGNOSIS — E785 Hyperlipidemia, unspecified: Secondary | ICD-10-CM

## 2013-07-30 DIAGNOSIS — I10 Essential (primary) hypertension: Secondary | ICD-10-CM

## 2013-07-30 DIAGNOSIS — M25559 Pain in unspecified hip: Secondary | ICD-10-CM

## 2013-07-30 MED ORDER — METFORMIN HCL 500 MG PO TABS: 500.0000 mg | ORAL_TABLET | Freq: Two times a day (BID) | ORAL | Status: DC

## 2013-07-30 NOTE — Progress Notes (Signed)
She didn't have her Medicare Annual Wellness questionnaire (she left it at home, will send in later) but we have noted 1. The patient's medical and social history 2. Their use of alcohol, tobacco or illicit drugs 3. Their current medications and supplements 4. The patient's functional ability including ADL's, fall risks, home safety risks and hearing or visual             Impairment. - she has no falls or deficits 5. Diet and physical activities- encouraged both 6. Evidence for depression or mood disorders- none found  The patients weight, height, BMI have been recorded in the chart and visual acuity is per eye clinic.  I have made referrals, counseling and provided education to the patient based review of the above and I have provided the pt with a written personalized care plan for preventive services.  See scanned forms.  Routine anticipatory guidance given to patient.  See health maintenance. Flu yearly Shingles up to date PNA up to date Tetanus up to date Colonoscopy due.  Prev had been seen by [facility], needs to set up with new GI clinic. Due for colonoscopy 2014 per [facility] records.  Breast cancer screening d/w pt.  Breast exam today, will set up mammogram.  Advance directive d/w pt.  Handout given re: living will.  Husband designated.  Cognitive function addressed- see scanned forms- and if abnormal then additional documentation follows.   PMH and SH reviewed  Meds, vitals, and allergies reviewed.   ROS: See HPI.  Otherwise negative.    GEN: nad, alert and oriented HEENT: mucous membranes moist NECK: supple w/o LA CV: rrr. PULM: ctab, no inc wob ABD: soft, +bs EXT: no edema SKIN: no acute rash but chronic skin changes noted.  Breast exam: No mass, nodules, thickening, tenderness, bulging, retraction, inflamation, nipple discharge or skin changes noted.  No axillary or clavicular LA.  Chaperoned exam.   Diabetic foot exam: Normal inspection No skin breakdown No calluses   Normal DP pulses Normal sensation to light touch and monofilament Nails normal

## 2013-07-30 NOTE — Assessment & Plan Note (Signed)
Controlled, continue diet/exercise/metformin.  Recheck in 6 months.  Labs discussed.

## 2013-07-30 NOTE — Assessment & Plan Note (Signed)
Usually lower out of clinic.  Continue as is.  She agrees.

## 2013-07-30 NOTE — Assessment & Plan Note (Signed)
Statin intolerant, continue diet and exercise.  

## 2013-07-30 NOTE — Assessment & Plan Note (Signed)
occ tramadol/nsaid use with relief.  Continue as is. Cr wnl.  Discussed.

## 2013-07-30 NOTE — Assessment & Plan Note (Signed)
At baseline 

## 2013-07-30 NOTE — Patient Instructions (Signed)
I would get a flu shot each fall.   See Shirlee Limerick about your referral before you leave today (mammogram and GI referral). Keep working on your weight.  Notify the pharmacy if you need refills.   Don't change your meds.  Recheck A1c before a visit in 6 months.  Take care.

## 2013-07-30 NOTE — Assessment & Plan Note (Signed)
See scanned forms.  Routine anticipatory guidance given to patient.  See health maintenance. Flu yearly Shingles up to date PNA up to date Tetanus up to date Colonoscopy due.  Prev had been seen by [facility], needs to set up with new GI clinic. Due for colonoscopy 2014 per [facility] records.  Breast cancer screening d/w pt.  Breast exam today, will set up mammogram.  Advance directive d/w pt.  Handout given re: living will.  Husband designated.  Cognitive function addressed- see scanned forms- and if abnormal then additional documentation follows.

## 2013-09-24 ENCOUNTER — Ambulatory Visit (AMBULATORY_SURGERY_CENTER): Payer: Self-pay

## 2013-09-24 VITALS — Ht 63.0 in | Wt 178.6 lb

## 2013-09-24 DIAGNOSIS — Z1211 Encounter for screening for malignant neoplasm of colon: Secondary | ICD-10-CM

## 2013-09-24 MED ORDER — NA SULFATE-K SULFATE-MG SULF 17.5-3.13-1.6 GM/177ML PO SOLN: ORAL | Status: DC

## 2013-09-26 ENCOUNTER — Encounter: Payer: Self-pay | Admitting: Gastroenterology

## 2013-10-08 ENCOUNTER — Encounter: Payer: Self-pay | Admitting: Gastroenterology

## 2013-10-08 ENCOUNTER — Ambulatory Visit (AMBULATORY_SURGERY_CENTER): Payer: Medicare Other | Admitting: Gastroenterology

## 2013-10-08 VITALS — BP 130/64 | HR 66 | Temp 96.6°F | Resp 15 | Ht 63.0 in | Wt 178.0 lb

## 2013-10-08 DIAGNOSIS — Z1211 Encounter for screening for malignant neoplasm of colon: Secondary | ICD-10-CM

## 2013-10-08 DIAGNOSIS — K5730 Diverticulosis of large intestine without perforation or abscess without bleeding: Secondary | ICD-10-CM

## 2013-10-08 DIAGNOSIS — K648 Other hemorrhoids: Secondary | ICD-10-CM

## 2013-10-08 LAB — GLUCOSE, CAPILLARY
Glucose-Capillary: 69 mg/dL — ABNORMAL LOW (ref 70–99)
Glucose-Capillary: 165 mg/dL — ABNORMAL HIGH (ref 70–99)

## 2013-10-08 MED ORDER — DEXTROSE 5 % IV SOLN: INTRAVENOUS | Status: DC

## 2013-10-08 NOTE — Progress Notes (Signed)
1045 Blood sugar 84.

## 2013-10-08 NOTE — Progress Notes (Signed)
Patient did not experience any of the following events: a burn prior to discharge; a fall within the facility; wrong site/side/patient/procedure/implant event; or a hospital transfer or hospital admission upon discharge from the facility. (G8907) Patient did not have preoperative order for IV antibiotic SSI prophylaxis. (G8918)  

## 2013-10-08 NOTE — Patient Instructions (Signed)
YOU HAD AN ENDOSCOPIC PROCEDURE TODAY AT THE [facility]: Refer to the procedure report that was given to you for any specific questions about what was found during the examination.  If the procedure report does not answer your questions, please call your gastroenterologist to clarify.  If you requested that your care partner not be given the details of your procedure findings, then the procedure report has been included in a sealed envelope for you to review at your convenience later.  YOU SHOULD EXPECT: Some feelings of bloating in the abdomen. Passage of more gas than usual.  Walking can help get rid of the air that was put into your GI tract during the procedure and reduce the bloating. If you had a lower endoscopy (such as a colonoscopy or flexible sigmoidoscopy) you may notice spotting of blood in your stool or on the toilet paper. If you underwent a bowel prep for your procedure, then you may not have a normal bowel movement for a few days.  DIET: Your first meal following the procedure should be a light meal and then it is ok to progress to your normal diet.  A half-sandwich or bowl of soup is an example of a good first meal.  Heavy or fried foods are harder to digest and may make you feel nauseous or bloated.  Likewise meals heavy in dairy and vegetables can cause extra gas to form and this can also increase the bloating.  Drink plenty of fluids but you should avoid alcoholic beverages for 24 hours.  ACTIVITY: Your care partner should take you home directly after the procedure.  You should plan to take it easy, moving slowly for the rest of the day.  You can resume normal activity the day after the procedure however you should NOT DRIVE or use heavy machinery for 24 hours (because of the sedation medicines used during the test).    SYMPTOMS TO REPORT IMMEDIATELY: A gastroenterologist can be reached at any hour.  During normal business hours, 8:30 AM to 5:00 PM Monday through Friday,  call [phone].  After hours and on weekends, please call the GI answering service at [phone] who will take a message and have the physician on call contact you.   Following lower endoscopy (colonoscopy or flexible sigmoidoscopy):  Excessive amounts of blood in the stool  Significant tenderness or worsening of abdominal pains  Swelling of the abdomen that is new, acute  Fever of 100F or higher    FOLLOW UP: If any biopsies were taken you will be contacted by phone or by letter within the next 1-3 weeks.  Call your gastroenterologist if you have not heard about the biopsies in 3 weeks.  Our staff will call the home number listed on your records the next business day following your procedure to check on you and address any questions or concerns that you may have at that time regarding the information given to you following your procedure. This is a courtesy call and so if there is no answer at the home number and we have not heard from you through the emergency physician on call, we will assume that you have returned to your regular daily activities without incident.  SIGNATURES/CONFIDENTIALITY: You and/or your care partner have signed paperwork which will be entered into your electronic medical record.  These signatures attest to the fact that that the information above on your After Visit Summary has been reviewed and is understood.  Full responsibility of the confidentiality   of this discharge information lies with you and/or your care-partner.     

## 2013-10-08 NOTE — Progress Notes (Signed)
Lidocaine-40mg IV prior to Propofol InductionPropofol given over incremental dosages 

## 2013-10-08 NOTE — Progress Notes (Signed)
At IV site area swollen. No redness. Area to wiggle fingers. Denies pain or  Discomfort. Patient does have a wrist splint on prior to procedure.

## 2013-10-08 NOTE — Op Note (Addendum)
[facility] [address] [place], [address]   COLONOSCOPY PROCEDURE REPORT  PATIENT: Natasha Woodard, Natasha Woodard  MR#: [id number] BIRTHDATE: 1941/05/22 , 72  yrs. old GENDER: Female ENDOSCOPIST: Louis Meckel, MD REFERRED BY: PROCEDURE DATE:  10/08/2013 PROCEDURE:   Colonoscopy, diagnostic First Screening Colonoscopy - Avg.  risk and is 50 yrs.  old or older Yes.  Prior Negative Screening - Now for repeat screening. N/A  History of Adenoma - Now for follow-up colonoscopy & has been > or = to 3 yrs.  N/A  Polyps Removed Today? No.  Recommend repeat exam, <10 yrs? No. ASA CLASS: INDICATIONS:average risk screening. MEDICATIONS: MAC sedation, administered by CRNA and propofol (Diprivan) 150mg  IV  DESCRIPTION OF PROCEDURE:   After the risks benefits and alternatives of the procedure were thoroughly explained, informed consent was obtained.  A digital rectal exam revealed no abnormalities of the rectum.   The LB [id number]  endoscope was introduced through the anus and advanced to the cecum, which was identified by both the appendix and ileocecal valve. No adverse events experienced.   The quality of the prep was Suprep good  The instrument was then slowly withdrawn as the colon was fully examined.      COLON FINDINGS: There was severe diverticulosis noted in the sigmoid colon with associated muscular hypertrophy.   Mild diverticulosis was noted in the transverse colon.   Internal hemorrhoids were found.   The colon was otherwise normal.  There was no diverticulosis, inflammation, polyps or cancers unless previously stated.  Retroflexed views revealed no abnormalities. The time to cecum=5 minutes 05 seconds.  Withdrawal time=6 minutes 10 seconds. The scope was withdrawn and the procedure completed. COMPLICATIONS: There were no complications.  ENDOSCOPIC IMPRESSION: 1.   There was severe diverticulosis noted in the sigmoid colon 2.   Mild diverticulosis was  noted in the transverse colon 3.   Internal hemorrhoids 4.   The colon was otherwise normal  RECOMMENDATIONS: 1.   Given your age, you will not need another colonoscopy for colon cancer screening or polyp surveillance.  These types of tests usually stop around the age 64.  eSigned:  Louis Meckel, MD 10/08/2013 11:42 AM Revised: 10/08/2013 11:42 AM  cc:   PATIENT NAME:  Natasha Woodard, Natasha Woodard MR#: [id number]

## 2013-10-09 ENCOUNTER — Telehealth: Payer: Self-pay | Admitting: *Deleted

## 2013-10-09 NOTE — Telephone Encounter (Signed)
  Follow up Call-  Call back number 10/08/2013  Post procedure Call Back phone  # [phone]  Permission to leave phone message Yes    [facility]

## 2013-10-17 ENCOUNTER — Other Ambulatory Visit: Payer: Self-pay

## 2013-11-04 ENCOUNTER — Other Ambulatory Visit: Payer: Self-pay | Admitting: Family Medicine

## 2013-11-05 ENCOUNTER — Other Ambulatory Visit: Payer: Self-pay | Admitting: *Deleted

## 2013-11-05 MED ORDER — TRAMADOL HCL 50 MG PO TABS: 50.0000 mg | ORAL_TABLET | Freq: Two times a day (BID) | ORAL | Status: DC

## 2013-11-05 NOTE — Telephone Encounter (Signed)
Faxed refill request. Please advise.  

## 2013-11-05 NOTE — Telephone Encounter (Signed)
Please call in.  Thanks.   

## 2013-11-05 NOTE — Telephone Encounter (Signed)
Medication phoned to pharmacy.  

## 2014-01-08 ENCOUNTER — Encounter: Payer: Self-pay | Admitting: Family Medicine

## 2014-01-08 ENCOUNTER — Ambulatory Visit (INDEPENDENT_AMBULATORY_CARE_PROVIDER_SITE_OTHER): Payer: Medicare Other | Admitting: Family Medicine

## 2014-01-08 VITALS — BP 128/74 | HR 84 | Temp 98.8°F | Wt 177.2 lb

## 2014-01-08 DIAGNOSIS — L989 Disorder of the skin and subcutaneous tissue, unspecified: Secondary | ICD-10-CM

## 2014-01-08 DIAGNOSIS — R238 Other skin changes: Secondary | ICD-10-CM | POA: Insufficient documentation

## 2014-01-08 MED ORDER — TRIAMCINOLONE ACETONIDE 0.1 % EX CREA: 1.0000 "application " | TOPICAL_CREAM | Freq: Two times a day (BID) | CUTANEOUS | Status: DC | PRN

## 2014-01-08 MED ORDER — HYDROXYZINE HCL 10 MG PO TABS: 5.0000 mg | ORAL_TABLET | Freq: Three times a day (TID) | ORAL | Status: DC | PRN

## 2014-01-08 NOTE — Progress Notes (Signed)
Pre-visit discussion using our clinic review tool. No additional management support is needed unless otherwise documented below in the visit note.  She had some irritated skin, worse than normal. No new bedding, detergents known.  Possible new soap- she was going to check the labels.  She had it happen in the past, had taken atarax prev with help (but was drowsy with it).  Sx restarted about two weeks ago.  No fevers.  No changes on the palms or in the mouth.  Used topical steroid prev, and again in the last week.  She has a superficial scratch on the R side of her back, wanted this checked.   Meds, vitals, and allergies reviewed.   ROS: See HPI.  Otherwise, noncontributory.  nad ncat Mmm rrr ctab No changes on the palms Chronic skin changes noted on the back and ext x4 superficial scratch on the R side of her back, doesn't appear infected and should heal on its own.  Diffuse erythema that doesn't appear infectious on the BLE, overlying the chronic changes No fluctuant masses on the skin.

## 2014-01-08 NOTE — Patient Instructions (Signed)
Use the hydroxyzine as needed and use the triamcinolone on the most itchy areas as needed.   Take care.

## 2014-01-08 NOTE — Assessment & Plan Note (Signed)
Leg changes should heal with topical steroid.  Doesn't appear infectious, not warm or tender.   Use atarax for itching in meantime, starting with a half dose. She agrees. Fu prn.

## 2014-01-23 ENCOUNTER — Other Ambulatory Visit: Payer: Medicare Other

## 2014-01-23 ENCOUNTER — Other Ambulatory Visit (INDEPENDENT_AMBULATORY_CARE_PROVIDER_SITE_OTHER): Payer: Medicare Other

## 2014-01-23 DIAGNOSIS — E119 Type 2 diabetes mellitus without complications: Secondary | ICD-10-CM

## 2014-01-23 LAB — HEMOGLOBIN A1C: Hgb A1c MFr Bld: 6.4 % (ref 4.6–6.5)

## 2014-01-30 ENCOUNTER — Ambulatory Visit: Payer: Medicare Other | Admitting: Family Medicine

## 2014-01-30 ENCOUNTER — Ambulatory Visit (INDEPENDENT_AMBULATORY_CARE_PROVIDER_SITE_OTHER): Payer: Medicare Other | Admitting: Family Medicine

## 2014-01-30 ENCOUNTER — Encounter: Payer: Self-pay | Admitting: Family Medicine

## 2014-01-30 VITALS — BP 126/70 | HR 95 | Temp 98.6°F | Wt 178.8 lb

## 2014-01-30 DIAGNOSIS — E119 Type 2 diabetes mellitus without complications: Secondary | ICD-10-CM

## 2014-01-30 NOTE — Patient Instructions (Addendum)
Call about getting a mammogram. Ask Shirlee Limerick for phone numbers.   Recheck labs before a physical in about 6 months.

## 2014-01-30 NOTE — Progress Notes (Signed)
Pre visit review using our clinic review tool, if applicable. No additional management support is needed unless otherwise documented below in the visit note.  Diabetes:  Using medications without difficulties: yes Hypoglycemic episodes:no Hyperglycemic episodes:no Feet problems:no  Blood Sugars averaging: ~120s eye exam within last year: yes A1c 6.4.  D/w pt.    Her leg irritation is improved.    Meds, vitals, and allergies reviewed.   ROS: See HPI.  Otherwise negative.    GEN: nad, alert and oriented HEENT: mucous membranes moist NECK: supple w/o LA CV: rrr. PULM: ctab, no inc wob ABD: soft, +bs EXT: no edema SKIN: no acute rash, chronic skin changes at baseline

## 2014-01-31 NOTE — Assessment & Plan Note (Signed)
Controlled, labs/diet/exercise d/w pt.  She'll work on diet and exercise. Recheck labs before a physical.  Doing well overall.

## 2014-05-05 ENCOUNTER — Other Ambulatory Visit: Payer: Self-pay | Admitting: Family Medicine

## 2014-05-19 ENCOUNTER — Other Ambulatory Visit: Payer: Self-pay | Admitting: Family Medicine

## 2014-05-19 NOTE — Telephone Encounter (Signed)
Electronic refill request. Last Filled:   180 tablet 1 RF on   11/05/2013.  Please advise.

## 2014-05-20 ENCOUNTER — Telehealth: Payer: Self-pay | Admitting: Family Medicine

## 2014-05-20 ENCOUNTER — Other Ambulatory Visit: Payer: Self-pay | Admitting: Family Medicine

## 2014-05-20 NOTE — Telephone Encounter (Signed)
Medication phoned to pharmacy.  

## 2014-05-20 NOTE — Telephone Encounter (Signed)
Pt called  Wanting to know when she needs to come in to get her a1c checked again.  She made her cpx for sept,

## 2014-05-20 NOTE — Telephone Encounter (Signed)
Labs before the 08/2014 CPE.  Not needed before then.  Thanks.

## 2014-05-20 NOTE — Telephone Encounter (Signed)
Please call in.  Thanks.   

## 2014-05-21 NOTE — Telephone Encounter (Signed)
Spoke to pt and advised per Dr Para March. Pt verbally expressed understanding. Lab appt sched before CPE in 08/2014

## 2014-06-02 ENCOUNTER — Other Ambulatory Visit: Payer: Self-pay | Admitting: Family Medicine

## 2014-06-27 ENCOUNTER — Other Ambulatory Visit: Payer: Self-pay | Admitting: Orthopedic Surgery

## 2014-06-30 ENCOUNTER — Encounter (HOSPITAL_BASED_OUTPATIENT_CLINIC_OR_DEPARTMENT_OTHER): Payer: Self-pay | Admitting: *Deleted

## 2014-06-30 NOTE — Progress Notes (Signed)
Will come in for ekg-bmet-no cardiac problems Denies sleep apnea-had a test several yr ago-did not have it

## 2014-07-01 ENCOUNTER — Encounter (HOSPITAL_BASED_OUTPATIENT_CLINIC_OR_DEPARTMENT_OTHER)
Admission: RE | Admit: 2014-07-01 | Discharge: 2014-07-01 | Disposition: A | Discharge status: Home / Self Care | Payer: Medicare Other | Source: Ambulatory Visit | Attending: Orthopedic Surgery | Admitting: Orthopedic Surgery

## 2014-07-01 DIAGNOSIS — J438 Other emphysema: Secondary | ICD-10-CM | POA: Diagnosis not present

## 2014-07-01 DIAGNOSIS — K449 Diaphragmatic hernia without obstruction or gangrene: Secondary | ICD-10-CM | POA: Diagnosis not present

## 2014-07-01 DIAGNOSIS — E669 Obesity, unspecified: Secondary | ICD-10-CM | POA: Diagnosis not present

## 2014-07-01 DIAGNOSIS — Z87891 Personal history of nicotine dependence: Secondary | ICD-10-CM | POA: Diagnosis not present

## 2014-07-01 DIAGNOSIS — Z01810 Encounter for preprocedural cardiovascular examination: Secondary | ICD-10-CM | POA: Diagnosis not present

## 2014-07-01 DIAGNOSIS — R911 Solitary pulmonary nodule: Secondary | ICD-10-CM | POA: Diagnosis not present

## 2014-07-01 DIAGNOSIS — M6530 Trigger finger, unspecified finger: Secondary | ICD-10-CM | POA: Diagnosis not present

## 2014-07-01 DIAGNOSIS — K219 Gastro-esophageal reflux disease without esophagitis: Secondary | ICD-10-CM | POA: Diagnosis not present

## 2014-07-01 DIAGNOSIS — M722 Plantar fascial fibromatosis: Secondary | ICD-10-CM | POA: Diagnosis not present

## 2014-07-01 DIAGNOSIS — G5600 Carpal tunnel syndrome, unspecified upper limb: Secondary | ICD-10-CM | POA: Diagnosis not present

## 2014-07-01 DIAGNOSIS — Z833 Family history of diabetes mellitus: Secondary | ICD-10-CM | POA: Diagnosis not present

## 2014-07-01 DIAGNOSIS — I10 Essential (primary) hypertension: Secondary | ICD-10-CM | POA: Diagnosis not present

## 2014-07-01 DIAGNOSIS — Z01812 Encounter for preprocedural laboratory examination: Secondary | ICD-10-CM | POA: Diagnosis not present

## 2014-07-01 DIAGNOSIS — M1990 Unspecified osteoarthritis, unspecified site: Secondary | ICD-10-CM | POA: Diagnosis not present

## 2014-07-01 DIAGNOSIS — E785 Hyperlipidemia, unspecified: Secondary | ICD-10-CM | POA: Diagnosis not present

## 2014-07-01 DIAGNOSIS — Q809 Congenital ichthyosis, unspecified: Secondary | ICD-10-CM | POA: Diagnosis not present

## 2014-07-01 DIAGNOSIS — Z6830 Body mass index (BMI) 30.0-30.9, adult: Secondary | ICD-10-CM | POA: Diagnosis not present

## 2014-07-01 DIAGNOSIS — E119 Type 2 diabetes mellitus without complications: Secondary | ICD-10-CM | POA: Diagnosis not present

## 2014-07-01 LAB — BASIC METABOLIC PANEL
ANION GAP: 13 (ref 5–15)
BUN: 15 mg/dL (ref 6–23)
CHLORIDE: 102 meq/L (ref 96–112)
CO2: 28 mEq/L (ref 19–32)
CREATININE: 0.7 mg/dL (ref 0.50–1.10)
Calcium: 9.8 mg/dL (ref 8.4–10.5)
GFR calc Af Amer: >90 mL/min (ref >90)
GFR calc non Af Amer: 84 mL/min — ABNORMAL LOW (ref >90)
GLUCOSE: 142 mg/dL — AB (ref 70–99)
Potassium: 4.3 mEq/L (ref 3.7–5.3)
Sodium: 143 mEq/L (ref 137–147)

## 2014-07-02 ENCOUNTER — Ambulatory Visit (HOSPITAL_BASED_OUTPATIENT_CLINIC_OR_DEPARTMENT_OTHER)
Admission: RE | Admit: 2014-07-02 | Discharge: 2014-07-02 | Disposition: A | Discharge status: Home / Self Care | Payer: Medicare Other | Source: Ambulatory Visit | Attending: Orthopedic Surgery | Admitting: Orthopedic Surgery

## 2014-07-02 ENCOUNTER — Encounter (HOSPITAL_BASED_OUTPATIENT_CLINIC_OR_DEPARTMENT_OTHER): Payer: Medicare Other | Admitting: Anesthesiology

## 2014-07-02 ENCOUNTER — Encounter (HOSPITAL_BASED_OUTPATIENT_CLINIC_OR_DEPARTMENT_OTHER)
Admission: RE | Disposition: A | Discharge status: Home / Self Care | Payer: Self-pay | Source: Ambulatory Visit | Attending: Orthopedic Surgery

## 2014-07-02 ENCOUNTER — Encounter (HOSPITAL_BASED_OUTPATIENT_CLINIC_OR_DEPARTMENT_OTHER): Payer: Self-pay | Admitting: Orthopedic Surgery

## 2014-07-02 ENCOUNTER — Ambulatory Visit (HOSPITAL_BASED_OUTPATIENT_CLINIC_OR_DEPARTMENT_OTHER): Payer: Medicare Other | Admitting: Anesthesiology

## 2014-07-02 DIAGNOSIS — E785 Hyperlipidemia, unspecified: Secondary | ICD-10-CM | POA: Insufficient documentation

## 2014-07-02 DIAGNOSIS — I10 Essential (primary) hypertension: Secondary | ICD-10-CM | POA: Diagnosis not present

## 2014-07-02 DIAGNOSIS — K219 Gastro-esophageal reflux disease without esophagitis: Secondary | ICD-10-CM | POA: Insufficient documentation

## 2014-07-02 DIAGNOSIS — M1990 Unspecified osteoarthritis, unspecified site: Secondary | ICD-10-CM | POA: Insufficient documentation

## 2014-07-02 DIAGNOSIS — M6530 Trigger finger, unspecified finger: Secondary | ICD-10-CM | POA: Insufficient documentation

## 2014-07-02 DIAGNOSIS — Q809 Congenital ichthyosis, unspecified: Secondary | ICD-10-CM | POA: Insufficient documentation

## 2014-07-02 DIAGNOSIS — Z6830 Body mass index (BMI) 30.0-30.9, adult: Secondary | ICD-10-CM | POA: Insufficient documentation

## 2014-07-02 DIAGNOSIS — Z01810 Encounter for preprocedural cardiovascular examination: Secondary | ICD-10-CM | POA: Insufficient documentation

## 2014-07-02 DIAGNOSIS — K449 Diaphragmatic hernia without obstruction or gangrene: Secondary | ICD-10-CM | POA: Insufficient documentation

## 2014-07-02 DIAGNOSIS — J438 Other emphysema: Secondary | ICD-10-CM | POA: Insufficient documentation

## 2014-07-02 DIAGNOSIS — M722 Plantar fascial fibromatosis: Secondary | ICD-10-CM | POA: Insufficient documentation

## 2014-07-02 DIAGNOSIS — E119 Type 2 diabetes mellitus without complications: Secondary | ICD-10-CM | POA: Diagnosis not present

## 2014-07-02 DIAGNOSIS — R911 Solitary pulmonary nodule: Secondary | ICD-10-CM | POA: Insufficient documentation

## 2014-07-02 DIAGNOSIS — Z01812 Encounter for preprocedural laboratory examination: Secondary | ICD-10-CM | POA: Insufficient documentation

## 2014-07-02 DIAGNOSIS — E669 Obesity, unspecified: Secondary | ICD-10-CM | POA: Insufficient documentation

## 2014-07-02 DIAGNOSIS — Z833 Family history of diabetes mellitus: Secondary | ICD-10-CM | POA: Insufficient documentation

## 2014-07-02 DIAGNOSIS — G5600 Carpal tunnel syndrome, unspecified upper limb: Secondary | ICD-10-CM | POA: Insufficient documentation

## 2014-07-02 DIAGNOSIS — Z87891 Personal history of nicotine dependence: Secondary | ICD-10-CM | POA: Insufficient documentation

## 2014-07-02 HISTORY — PX: CARPAL TUNNEL RELEASE: SHX101

## 2014-07-02 HISTORY — DX: Presence of spectacles and contact lenses: Z97.3

## 2014-07-02 LAB — GLUCOSE, CAPILLARY
Glucose-Capillary: 72 mg/dL (ref 70–99)
Glucose-Capillary: 83 mg/dL (ref 70–99)

## 2014-07-02 LAB — POCT HEMOGLOBIN-HEMACUE: HEMOGLOBIN: 12.7 g/dL (ref 12.0–15.0)

## 2014-07-02 SURGERY — CARPAL TUNNEL RELEASE
Anesthesia: Monitor Anesthesia Care | Site: Hand | Laterality: Right

## 2014-07-02 MED ORDER — ONDANSETRON HCL 4 MG/2ML IJ SOLN
INTRAMUSCULAR | Status: DC | PRN
  Administered 2014-07-02: 4 mg via INTRAVENOUS

## 2014-07-02 MED ORDER — FENTANYL CITRATE 0.05 MG/ML IJ SOLN: 25.0000 ug | INTRAMUSCULAR | Status: DC | PRN

## 2014-07-02 MED ORDER — PROPOFOL 10 MG/ML IV BOLUS
INTRAVENOUS | Status: DC | PRN
  Administered 2014-07-02: 40 mg via INTRAVENOUS

## 2014-07-02 MED ORDER — BUPIVACAINE HCL (PF) 0.25 % IJ SOLN
INTRAMUSCULAR | Status: DC | PRN
  Administered 2014-07-02: 7 mL

## 2014-07-02 MED ORDER — MIDAZOLAM HCL 2 MG/2ML IJ SOLN: 1.0000 mg | INTRAMUSCULAR | Status: DC | PRN

## 2014-07-02 MED ORDER — LIDOCAINE HCL (PF) 0.5 % IJ SOLN
INTRAMUSCULAR | Status: DC | PRN
  Administered 2014-07-02: 30 mL via INTRAVENOUS

## 2014-07-02 MED ORDER — FENTANYL CITRATE 0.05 MG/ML IJ SOLN: 50.0000 ug | INTRAMUSCULAR | Status: DC | PRN

## 2014-07-02 MED ORDER — CEFAZOLIN SODIUM-DEXTROSE 2-3 GM-% IV SOLR
INTRAVENOUS | Status: AC
  Filled 2014-07-02: qty 50

## 2014-07-02 MED ORDER — FENTANYL CITRATE 0.05 MG/ML IJ SOLN
INTRAMUSCULAR | Status: AC
  Filled 2014-07-02: qty 2

## 2014-07-02 MED ORDER — CHLORHEXIDINE GLUCONATE 4 % EX LIQD: 60.0000 mL | Freq: Once | CUTANEOUS | Status: DC

## 2014-07-02 MED ORDER — METOCLOPRAMIDE HCL 5 MG/ML IJ SOLN: 10.0000 mg | Freq: Once | INTRAMUSCULAR | Status: DC | PRN

## 2014-07-02 MED ORDER — CEFAZOLIN SODIUM-DEXTROSE 2-3 GM-% IV SOLR: 2.0000 g | INTRAVENOUS | Status: DC

## 2014-07-02 MED ORDER — MIDAZOLAM HCL 2 MG/2ML IJ SOLN
INTRAMUSCULAR | Status: AC
  Filled 2014-07-02: qty 2

## 2014-07-02 MED ORDER — MIDAZOLAM HCL 5 MG/5ML IJ SOLN
INTRAMUSCULAR | Status: DC | PRN
  Administered 2014-07-02: 2 mg via INTRAVENOUS

## 2014-07-02 MED ORDER — PROPOFOL INFUSION 10 MG/ML OPTIME
INTRAVENOUS | Status: DC | PRN
  Administered 2014-07-02: 100 ug/kg/min via INTRAVENOUS

## 2014-07-02 MED ORDER — LACTATED RINGERS IV SOLN
INTRAVENOUS | Status: DC
  Administered 2014-07-02 (×2): via INTRAVENOUS

## 2014-07-02 MED ORDER — FENTANYL CITRATE 0.05 MG/ML IJ SOLN
INTRAMUSCULAR | Status: DC | PRN
  Administered 2014-07-02 (×2): 50 ug via INTRAVENOUS

## 2014-07-02 MED ORDER — HYDROCODONE-ACETAMINOPHEN 5-325 MG PO TABS: 1.0000 | ORAL_TABLET | Freq: Four times a day (QID) | ORAL | Status: DC | PRN

## 2014-07-02 SURGICAL SUPPLY — 36 items
BLADE SURG 15 STRL LF DISP TIS (BLADE) ×1 IMPLANT
BLADE SURG 15 STRL SS (BLADE) ×3
BNDG CMPR 9X4 STRL LF SNTH (GAUZE/BANDAGES/DRESSINGS)
BNDG COHESIVE 3X5 TAN STRL LF (GAUZE/BANDAGES/DRESSINGS) ×3 IMPLANT
BNDG ESMARK 4X9 LF (GAUZE/BANDAGES/DRESSINGS) IMPLANT
BNDG GAUZE ELAST 4 BULKY (GAUZE/BANDAGES/DRESSINGS) ×3 IMPLANT
CHLORAPREP W/TINT 26ML (MISCELLANEOUS) ×3 IMPLANT
CORDS BIPOLAR (ELECTRODE) ×3 IMPLANT
COVER MAYO STAND STRL (DRAPES) ×3 IMPLANT
COVER TABLE BACK 60X90 (DRAPES) ×3 IMPLANT
CUFF TOURNIQUET SINGLE 18IN (TOURNIQUET CUFF) ×3 IMPLANT
DRAPE EXTREMITY T 121X128X90 (DRAPE) ×3 IMPLANT
DRAPE SURG 17X23 STRL (DRAPES) ×3 IMPLANT
DRSG PAD ABDOMINAL 8X10 ST (GAUZE/BANDAGES/DRESSINGS) ×3 IMPLANT
GAUZE SPONGE 4X4 12PLY STRL (GAUZE/BANDAGES/DRESSINGS) ×3 IMPLANT
GAUZE XEROFORM 1X8 LF (GAUZE/BANDAGES/DRESSINGS) ×3 IMPLANT
GLOVE BIOGEL PI IND STRL 7.0 (GLOVE) IMPLANT
GLOVE BIOGEL PI IND STRL 8.5 (GLOVE) ×1 IMPLANT
GLOVE BIOGEL PI INDICATOR 7.0 (GLOVE) ×2
GLOVE BIOGEL PI INDICATOR 8.5 (GLOVE) ×2
GLOVE ECLIPSE 6.5 STRL STRAW (GLOVE) ×2 IMPLANT
GLOVE SURG ORTHO 8.0 STRL STRW (GLOVE) ×3 IMPLANT
GOWN STRL REUS W/ TWL LRG LVL3 (GOWN DISPOSABLE) ×1 IMPLANT
GOWN STRL REUS W/TWL LRG LVL3 (GOWN DISPOSABLE) ×3
GOWN STRL REUS W/TWL XL LVL3 (GOWN DISPOSABLE) ×3 IMPLANT
NEEDLE 27GAX1X1/2 (NEEDLE) ×2 IMPLANT
NS IRRIG 1000ML POUR BTL (IV SOLUTION) ×3 IMPLANT
PACK BASIN DAY SURGERY FS (CUSTOM PROCEDURE TRAY) ×3 IMPLANT
STOCKINETTE 4X48 STRL (DRAPES) ×3 IMPLANT
SUT VICRYL 4-0 PS2 18IN ABS (SUTURE) IMPLANT
SUT VICRYL RAPIDE 4/0 PS 2 (SUTURE) ×3 IMPLANT
SYR BULB 3OZ (MISCELLANEOUS) ×3 IMPLANT
SYRINGE CONTROL L 12CC (SYRINGE) ×3 IMPLANT
SYRINGE CONTROL LL 12CC (SYRINGE) IMPLANT
TOWEL OR 17X24 6PK STRL BLUE (TOWEL DISPOSABLE) ×3 IMPLANT
UNDERPAD 30X30 INCONTINENT (UNDERPADS AND DIAPERS) ×3 IMPLANT

## 2014-07-02 NOTE — Brief Op Note (Signed)
07/02/2014  9:57 AM  PATIENT:  Natasha Woodard  73 y.o. female  PRE-OPERATIVE DIAGNOSIS:  RIGHT CARPLA TUNNEL SYNDROME  POST-OPERATIVE DIAGNOSIS:  Right Carpal tunnel syndrome  PROCEDURE:  Procedure(s): RIGHT CARPAL TUNNEL RELEASE (Right)  SURGEON:  Surgeon(s) and Role:    * Nicki Reaper, MD - Primary  PHYSICIAN ASSISTANT:   ASSISTANTS: none   ANESTHESIA:   local and regional  EBL:  Total I/O In: 1000 [I.V.:1000] Out: -   BLOOD ADMINISTERED:none  DRAINS: none   LOCAL MEDICATIONS USED:  BUPIVICAINE   SPECIMEN:  No Specimen  DISPOSITION OF SPECIMEN:  N/A  COUNTS:  YES  TOURNIQUET:  * Missing tourniquet times found for documented tourniquets in log:  [id number] *  DICTATION: .Other Dictation: Dictation Number [id number]  PLAN OF CARE: Discharge to home after PACU  PATIENT DISPOSITION:  PACU - hemodynamically stable.

## 2014-07-02 NOTE — Anesthesia Postprocedure Evaluation (Signed)
Anesthesia Post Note  Patient: Natasha Woodard  Procedure(s) Performed: Procedure(s) (LRB): RIGHT CARPAL TUNNEL RELEASE (Right)  Anesthesia type: MAC  Patient location: PACU  Post pain: Pain level controlled  Post assessment: Patient's Cardiovascular Status Stable  Last Vitals:  Filed Vitals:   07/02/14 1018  BP: 174/75  Pulse: 64  Temp:   Resp: 16    Post vital signs: Reviewed and stable  Level of consciousness: alert  Complications: No apparent anesthesia complications

## 2014-07-02 NOTE — Anesthesia Preprocedure Evaluation (Signed)
Anesthesia Evaluation  Patient identified by MRN, date of birth, ID band Patient awake    Reviewed: Allergy & Precautions, H&P , NPO status , Patient's Chart, lab work & pertinent test results, reviewed documented beta blocker date and time   Airway Mallampati: II TM Distance: >3 FB Neck ROM: full    Dental   Pulmonary COPDformer smoker,  breath sounds clear to auscultation        Cardiovascular hypertension, On Medications Rhythm:regular     Neuro/Psych  Neuromuscular disease negative neurological ROS  negative psych ROS   GI/Hepatic negative GI ROS, Neg liver ROS, hiatal hernia,   Endo/Other  diabetes, Oral Hypoglycemic Agents  Renal/GU negative Renal ROS  negative genitourinary   Musculoskeletal   Abdominal   Peds  Hematology negative hematology ROS (+)   Anesthesia Other Findings See surgeon's H&P   Reproductive/Obstetrics negative OB ROS                           Anesthesia Physical Anesthesia Plan  ASA: III  Anesthesia Plan: MAC and Bier Block   Post-op Pain Management:    Induction: Intravenous  Airway Management Planned: Simple Face Mask  Additional Equipment:   Intra-op Plan:   Post-operative Plan:   Informed Consent: I have reviewed the patients History and Physical, chart, labs and discussed the procedure including the risks, benefits and alternatives for the proposed anesthesia with the patient or authorized representative who has indicated his/her understanding and acceptance.   Dental Advisory Given  Plan Discussed with: CRNA and Surgeon  Anesthesia Plan Comments:         Anesthesia Quick Evaluation

## 2014-07-02 NOTE — Transfer of Care (Signed)
Immediate Anesthesia Transfer of Care Note  Patient: Natasha Woodard  Procedure(s) Performed: Procedure(s): RIGHT CARPAL TUNNEL RELEASE (Right)  Patient Location: PACU  Anesthesia Type:MAC and Regional  Level of Consciousness: awake, alert  and oriented  Airway & Oxygen Therapy: Patient Spontanous Breathing and Patient connected to face mask oxygen  Post-op Assessment: Report given to PACU RN and Post -op Vital signs reviewed and stable  Post vital signs: Reviewed and stable  Complications: No apparent anesthesia complications

## 2014-07-02 NOTE — Op Note (Signed)
NAME:  Natasha Woodard, Natasha Woodard              ACCOUNT NO.:  [id number]  MEDICAL RECORD NO.:  [id number]  LOCATION:                                 FACILITY:  PHYSICIAN:  Cindee Salt, M.D.            DATE OF BIRTH:  DATE OF PROCEDURE:  07/02/2014 DATE OF DISCHARGE:                              OPERATIVE REPORT   PREOPERATIVE DIAGNOSIS:  Carpal tunnel syndrome, right hand.  POSTOPERATIVE DIAGNOSIS:  Carpal tunnel syndrome, right hand.  OPERATION:  Decompression of right median nerve.  SURGEON:  Cindee Salt, MD  ANESTHESIA:  Forearm-based IV regional with sedation and local infiltration.  ANESTHESIOLOGIST:  [place].  HISTORY:  The patient is a 73 year old female with a history of carpal tunnel syndrome, nerve conductions positive, which has not responded to conservative treatment.  She has elected to undergo surgical release of the carpal canal on her right side.  Pre, peri, and postoperative course have been discussed along with risks and complications.  She is aware that there is no guarantee with the surgery, possibility of infection; recurrence of injury to arteries, nerves, tendons; incomplete relief of symptoms, dystrophy.  In preoperative area, the patient is seen, the extremity marked by both patient and surgeon.  Antibiotic given.  PROCEDURE IN DETAIL:  The patient was brought to the operating room, where a forearm-based IV regional anesthetic was carried out without difficulty with the right arm free in supine position.  She was prepped using ChloraPrep.  A 3-minute dry time was allowed.  Time-out taken, confirming the patient and procedure.  A longitudinal incision was then made in the right palm, carried down through subcutaneous tissue. Bleeders were electrocauterized.  Palmar fascia was split.  Superficial palmar arch identified.  The flexor tendon to the ring little finger identified to the ulnar side of the median nerve.  The carpal retinaculum was incised with sharp  dissection.  Right angle and Sewall retractor were placed between skin and forearm fascia.  The fascia was released for approximately 2 cm proximal to the wrist crease under direct vision.  Canal was explored.  Air compression to the nerve was apparent.  Motor branch entered into muscle.  No further lesions were identified.  The wound was irrigated.  The skin then closed with interrupted 4-0 Vicryl Rapide sutures.  Local infiltration with 0.25% Marcaine without epinephrine was given, approximately 7 mL was used.  A sterile compressive dressing with the thumb and fingers free was applied.  On deflation of the tourniquet, all fingers immediately pinked.  She was taken to the recovery room for observation in satisfactory condition.  She will be discharged home to return to the [facility] [place] in 1 week on [place].          ______________________________ Cindee Salt, M.D.     GK/MEDQ  D:  07/02/2014  T:  07/02/2014  Job:  [id number]

## 2014-07-02 NOTE — H&P (Signed)
Natasha Woodard is a 72 year-old right-hand dominant female  complaining of pain in her left wrist, this has been going on for three years.  She has no history of injury.  She has undergone a   shoulder operation on her left side.  She has history of diabetes, no history of thyroid problems, arthritis or gout.  There is family history of diabetes, thyroid problems, arthritis and gout.   She has no history of injury to the hand or neck.  She is complaining of numbness, tingling and pain bilaterally.  History of carpal tunnel syndrome.  Nerve conductions are positive.  She is awakened 7 out of 7 nights.  She has been wearing braces, taking Advil for a day along with tramadol. She has virtually constant numbness and tingling especially to her right hand.  She is also complaining of numbness and tingling on her left side on a less constant basis, all fingers are involved, right is much worse than left.  She complains of an intermittent, severe, throbbing, aching numbness with a feeling of weakness.  She states this is getting worse.  The braces have helped.   Nerve conductions have been done by Dr. Zebedee Iba and these were done on 10/02/13 revealing motor delay of 6.1/left, 9.4/right, sensory delay 3.5/left, 4.3/right with amplitude diminution to 15.7/left and 6.7/right.d.   ALLERGIES:    Sulfa  MEDICATIONS:    Advil, tramadol, gabapentin, amlodipine, metformin, aspirin, vitamin D.  SURGICAL HISTORY:    Shoulder surgery for rotator cuff, hysterectomy.  FAMILY MEDICAL HISTORY:   Positive for diabetes, high blood pressure, arthritis and heart disease.  SOCIAL HISTORY:    She does not smoke or drink.  She is married and retired.   REVIEW OF SYSTEMS:    Positive for easy bleeding, otherwise negative.  Natasha Woodard is an 73 y.o. female.   Chief Complaint: CTS right HPI: see above  Past Medical History  Diagnosis Date  . Hypertension   . Hyperlipidemia     Statin intolerant  . Diabetes mellitus    Type II  . Hiatal hernia     with reflux  . Emphysema of lung   . Ichthyosis congenita   . Obesity   . Pulmonary nodule     Imaged multiple times and benign appearing  . Plantar fasciitis     Left  . NSVD (normal spontaneous vaginal delivery) 1971 & 1972  . Calcific tendonitis 2008    Treatment of left leg  . CTS (carpal tunnel syndrome)     worse on right hand/ wears wrist splint  . Wears glasses     Past Surgical History  Procedure Laterality Date  . Abdominal hysterectomy  1975    TAH-BSO  . Corn removal  1968    right  . Abscess drainage  1972    right breast  . Rotator cuff repair      left shoulder  . Colonoscopy      Family History  Problem Relation Age of Onset  . Heart disease Mother   . Diabetes Mother   . Heart disease Father   . Diabetes Father   . Heart disease Brother     MI  . Cancer Brother     Lung  . Colon cancer Neg Hx   . Breast cancer Neg Hx    Social History:  reports that she quit smoking about 20 years ago. Her smoking use included Cigarettes. She smoked 0.00 packs per day for 28 years. She has  never used smokeless tobacco. She reports that she does not drink alcohol or use illicit drugs.  Allergies:  Allergies  Allergen Reactions  . Ezetimibe-Simvastatin     REACTION: Muscle aches (side effect)  . Pravastatin Sodium     REACTION: Muscle aches (side effect)  . Sulfonamide Derivatives Nausea And Vomiting    No prescriptions prior to admission    Results for orders placed during the hospital encounter of 07/02/14 (from the past 48 hour(s))  BASIC METABOLIC PANEL     Status: Abnormal   Collection Time    07/01/14 10:30 AM      Result Value Ref Range   Sodium 143  137 - 147 mEq/L   Potassium 4.3  3.7 - 5.3 mEq/L   Chloride 102  96 - 112 mEq/L   CO2 28  19 - 32 mEq/L   Glucose, Bld 142 (*) 70 - 99 mg/dL   BUN 15  6 - 23 mg/dL   Creatinine, Ser 0.70  0.50 - 1.10 mg/dL   Calcium 9.8  8.4 - 10.5 mg/dL   GFR calc non Af Amer 84  (*) >90 mL/min   GFR calc Af Amer >90  >90 mL/min   Comment: (NOTE)     The eGFR has been calculated using the CKD EPI equation.     This calculation has not been validated in all clinical situations.     eGFR's persistently <90 mL/min signify possible Chronic Kidney     Disease.   Anion gap 13  5 - 15    No results found.   Pertinent items are noted in HPI.  Height 5' 3"  (1.6 m), weight 80.74 kg (178 lb).  General appearance: alert, cooperative and appears stated age Head: Normocephalic, without obvious abnormality Neck: no JVD Resp: normal percussion bilaterally clear to auscultation Cardio: regular rate and rhythm, S1, S2 normal, no murmur, click, rub or gallop GI: soft, non-tender; bowel sounds normal; no masses,  no organomegaly Extremities: extremities normal, atraumatic, no cyanosis or edema Pulses: 2+ and symmetric Skin: Skin color, texture, turgor normal. No rashes or lesions scaly skin  Neurologic: Grossly normal Incision/Wound: na  Assessment/Plan DIAGNOSIS:    Degenerative arthritis.   Stenosing tenosynovitis left middle finger.   Bilateral carpal tunnel syndrome. The pre, peri and postoperative course were discussed along with the risks and complications.  The patient is aware there is no guarantee with the surgery, possibility of infection, recurrence, injury to arteries, nerves, tendons, incomplete relief of symptoms and dystrophy, the possibility of no change in the contracture of her PIP joint to the middle finger, left hand.    She would like to have the right side done.   I would not recommend any treatment to the arthritis at the present time.  Approximately  hour is spent with the patient face to face and she would like to proceed.    She is scheduled for right carpal tunnel release as an outpatient under regional anesthesia.  Natasha Woodard R 07/02/2014, 7:42 AM

## 2014-07-02 NOTE — Op Note (Signed)
Dictation Number [id number]

## 2014-07-02 NOTE — Discharge Instructions (Addendum)

## 2014-07-03 ENCOUNTER — Encounter (HOSPITAL_BASED_OUTPATIENT_CLINIC_OR_DEPARTMENT_OTHER): Payer: Self-pay | Admitting: Orthopedic Surgery

## 2014-08-09 ENCOUNTER — Other Ambulatory Visit: Payer: Self-pay | Admitting: Family Medicine

## 2014-08-10 ENCOUNTER — Other Ambulatory Visit: Payer: Self-pay | Admitting: Family Medicine

## 2014-08-10 DIAGNOSIS — E119 Type 2 diabetes mellitus without complications: Secondary | ICD-10-CM

## 2014-08-13 ENCOUNTER — Other Ambulatory Visit (INDEPENDENT_AMBULATORY_CARE_PROVIDER_SITE_OTHER): Payer: Medicare Other

## 2014-08-13 DIAGNOSIS — E119 Type 2 diabetes mellitus without complications: Secondary | ICD-10-CM

## 2014-08-13 LAB — HEMOGLOBIN A1C: Hgb A1c MFr Bld: 6.3 % (ref 4.6–6.5)

## 2014-08-13 LAB — HEPATIC FUNCTION PANEL
ALBUMIN: 3.7 g/dL (ref 3.5–5.2)
ALT: 14 U/L (ref 0–35)
AST: 19 U/L (ref 0–37)
Alkaline Phosphatase: 55 U/L (ref 39–117)
Bilirubin, Direct: 0 mg/dL (ref 0.0–0.3)
Total Bilirubin: 0.6 mg/dL (ref 0.2–1.2)
Total Protein: 6.9 g/dL (ref 6.0–8.3)

## 2014-08-13 LAB — LIPID PANEL
CHOL/HDL RATIO: 4
Cholesterol: 174 mg/dL (ref 0–200)
HDL: 39.3 mg/dL (ref >39.00)
LDL Cholesterol: 114 mg/dL — ABNORMAL HIGH (ref 0–99)
NONHDL: 134.7
Triglycerides: 104 mg/dL (ref 0.0–149.0)
VLDL: 20.8 mg/dL (ref 0.0–40.0)

## 2014-08-14 ENCOUNTER — Other Ambulatory Visit: Payer: Medicare Other

## 2014-08-19 ENCOUNTER — Ambulatory Visit (INDEPENDENT_AMBULATORY_CARE_PROVIDER_SITE_OTHER): Payer: Medicare Other | Admitting: Family Medicine

## 2014-08-19 ENCOUNTER — Other Ambulatory Visit: Payer: Self-pay | Admitting: Family Medicine

## 2014-08-19 ENCOUNTER — Encounter: Payer: Self-pay | Admitting: Family Medicine

## 2014-08-19 VITALS — BP 126/78 | HR 84 | Temp 98.2°F | Ht 62.0 in | Wt 174.0 lb

## 2014-08-19 DIAGNOSIS — E2839 Other primary ovarian failure: Secondary | ICD-10-CM

## 2014-08-19 DIAGNOSIS — I10 Essential (primary) hypertension: Secondary | ICD-10-CM

## 2014-08-19 DIAGNOSIS — Z7189 Other specified counseling: Secondary | ICD-10-CM | POA: Insufficient documentation

## 2014-08-19 DIAGNOSIS — E119 Type 2 diabetes mellitus without complications: Secondary | ICD-10-CM

## 2014-08-19 DIAGNOSIS — M25569 Pain in unspecified knee: Secondary | ICD-10-CM

## 2014-08-19 DIAGNOSIS — Z23 Encounter for immunization: Secondary | ICD-10-CM

## 2014-08-19 DIAGNOSIS — Z0000 Encounter for general adult medical examination without abnormal findings: Secondary | ICD-10-CM

## 2014-08-19 DIAGNOSIS — Z1231 Encounter for screening mammogram for malignant neoplasm of breast: Secondary | ICD-10-CM

## 2014-08-19 MED ORDER — ENALAPRIL MALEATE 20 MG PO TABS: ORAL_TABLET | ORAL | Status: DC

## 2014-08-19 MED ORDER — TRAMADOL HCL 50 MG PO TABS: ORAL_TABLET | ORAL | Status: DC

## 2014-08-19 MED ORDER — AMLODIPINE BESYLATE 5 MG PO TABS: ORAL_TABLET | ORAL | Status: DC

## 2014-08-19 MED ORDER — GLUCOSE BLOOD VI STRP: ORAL_STRIP | Status: DC

## 2014-08-19 MED ORDER — ACCU-CHEK MULTICLIX LANCETS MISC: Status: DC

## 2014-08-19 MED ORDER — METFORMIN HCL 500 MG PO TABS: 500.0000 mg | ORAL_TABLET | Freq: Two times a day (BID) | ORAL | Status: DC

## 2014-08-19 NOTE — Progress Notes (Signed)
Tramadol called to pharmacy as instructed.

## 2014-08-19 NOTE — Assessment & Plan Note (Signed)
Flu 2015 Shingles 2007 PNA 2007 Tetanus 2008 Colonoscopy 2014 Breast cancer screening- she'll call about mammogram.  DXA ordered.  Advance directive- husband designated if patient were incapacitated.  Cognitive function addressed- see scanned forms- and if abnormal then additional documentation follows.

## 2014-08-19 NOTE — Assessment & Plan Note (Signed)
Controlled, labs d/w pt.  Continue as is, no change in meds.  Doing well. Recheck 6 months.

## 2014-08-19 NOTE — Progress Notes (Signed)
Pre visit review using our clinic review tool, if applicable. No additional management support is needed unless otherwise documented below in the visit note.  I have personally reviewed the Medicare Annual Wellness questionnaire and have noted 1. The patient's medical and social history 2. Their use of alcohol, tobacco or illicit drugs 3. Their current medications and supplements 4. The patient's functional ability including ADL's, fall risks, home safety risks and hearing or visual             impairment. 5. Diet and physical activities 6. Evidence for depression or mood disorders  The patients weight, height, BMI have been recorded in the chart and visual acuity is per eye clinic.  I have made referrals, counseling and provided education to the patient based review of the above and I have provided the pt with a written personalized care plan for preventive services.  Provider list updated- see scanned forms.  Routine anticipatory guidance given to patient.  See health maintenance.  Flu 2015 Shingles 2007 PNA 2007 Tetanus 2008 Colonoscopy 2014 Breast cancer screening- she'll call about mammogram.  DXA ordered.  Advance directive- husband designated if patient were incapacitated.  Cognitive function addressed- see scanned forms- and if abnormal then additional documentation follows.   Diabetes:  Using medications without difficulties:yes Hypoglycemic episodes:no Hyperglycemic episodes:no Feet problems:no Blood Sugars averaging: 100-120 eye exam within last year: yes, has f/u tomorrow.  Hypertension:    Using medication without problems or lightheadedness: yes Chest pain with exertion:no Edema:no Short of breath: rarely, not with exetion Compliant with meds.   D/w pt about diet and exercise.   Knee pain much improved with tramadol + ibuprofen, no ADE. Doing well.   Hand pain much improved after CTS surgery this year.    PMH and SH reviewed  Meds, vitals, and allergies  reviewed.   ROS: See HPI.  Otherwise negative.    GEN: nad, alert and oriented HEENT: mucous membranes moist NECK: supple w/o LA CV: rrr. PULM: ctab, no inc wob ABD: soft, +bs EXT: no edema SKIN: no acute rash but chronic changes from ichthyosis noted.   Diabetic foot exam: Normal inspection No skin breakdown No calluses  Normal DP pulses Normal sensation to light touch and monofilament Nails normal except onychomycosis on L 1st nail.  R 1st nail absent.

## 2014-08-19 NOTE — Patient Instructions (Addendum)
Call about a mammogram.  Shirlee Limerick will call about your referral for the bone density test.  We'll likely update your pneumonia shot at the next visit.  Don't change your meds for now.  Recheck A1c before a visit in 6 months.   Glad to see you.

## 2014-08-19 NOTE — Assessment & Plan Note (Signed)
Controlled, labs d/w pt. Wouldn't start statin given her hx.  F/u prn.

## 2014-08-19 NOTE — Assessment & Plan Note (Signed)
Continue prn tramadol and ibuprofen.  D/w pt.  She agrees. No ADE.

## 2014-08-20 LAB — HM DIABETES EYE EXAM

## 2014-08-28 ENCOUNTER — Encounter: Payer: Self-pay | Admitting: Family Medicine

## 2014-08-28 ENCOUNTER — Ambulatory Visit (INDEPENDENT_AMBULATORY_CARE_PROVIDER_SITE_OTHER): Payer: Medicare Other | Admitting: Family Medicine

## 2014-08-28 VITALS — BP 136/74 | HR 82 | Temp 98.2°F

## 2014-08-28 DIAGNOSIS — M533 Sacrococcygeal disorders, not elsewhere classified: Secondary | ICD-10-CM

## 2014-08-28 DIAGNOSIS — W19XXXA Unspecified fall, initial encounter: Secondary | ICD-10-CM

## 2014-08-28 DIAGNOSIS — Y92009 Unspecified place in unspecified non-institutional (private) residence as the place of occurrence of the external cause: Secondary | ICD-10-CM

## 2014-08-28 NOTE — Patient Instructions (Signed)
Activity as tolerated.  Limit activity that is painful.  Ice and tramadol for now.   Use a doughnut pillow.  This should slowly get better.  I wouldn't use heat for now.  It may help a little later, next week.

## 2014-08-28 NOTE — Progress Notes (Signed)
Pre visit review using our clinic review tool, if applicable. No additional management support is needed unless otherwise documented below in the visit note.  She was up late at night, she had a new coffee table and was going upstairs.  She tripped at the table, went down.  No LOC.  Hit her R thigh and tailbone.  Pain sitting, driving, walking.  She can still bear weight.  No pop or snap heard at the time.  Using a cane now.   Pain at the tailbone.   Meds, vitals, and allergies reviewed.   ROS: See HPI.  Otherwise, noncontributory.  nad ncat Mmm rrr ctab abd soft R hip with normal ROM, not ttp, able to bear full weight on either leg w/o weakness ttp at the coccyx.

## 2014-08-29 DIAGNOSIS — Y92009 Unspecified place in unspecified non-institutional (private) residence as the place of occurrence of the external cause: Secondary | ICD-10-CM | POA: Insufficient documentation

## 2014-08-29 DIAGNOSIS — W19XXXA Unspecified fall, initial encounter: Secondary | ICD-10-CM | POA: Insufficient documentation

## 2014-08-29 NOTE — Assessment & Plan Note (Signed)
Incidental, with a new table causing the issue.  Discussed fall cautions. Use ice for now and a cane prn.  Should improve with tramadol. No need to image, d/w pt.  She agrees.  Use a pillow for cushion in the meantime.

## 2014-09-04 ENCOUNTER — Telehealth: Payer: Self-pay | Admitting: Family Medicine

## 2014-09-04 NOTE — Telephone Encounter (Signed)
Pt brought in form for Handicap parking placard, Pt is requesting to have it back by Friday.

## 2014-09-04 NOTE — Telephone Encounter (Signed)
Patient notified by telephone that signed form is up front ready for pickup.

## 2014-09-04 NOTE — Telephone Encounter (Signed)
Form done. Thanks. 

## 2014-09-10 ENCOUNTER — Telehealth: Payer: Self-pay | Admitting: Family Medicine

## 2014-09-10 DIAGNOSIS — H9190 Unspecified hearing loss, unspecified ear: Secondary | ICD-10-CM

## 2014-09-10 NOTE — Telephone Encounter (Signed)
Patient notified that referral has been done and she will hear back from the referral coordinator to get this set up.

## 2014-09-10 NOTE — Telephone Encounter (Signed)
Done, thanks

## 2014-09-10 NOTE — Telephone Encounter (Signed)
Pt called requesting to Audiologist. Pt cannot hear as good as she used too. Pt states you all talked about this at her [facility] on 08/19/14. Please advise   [phone]

## 2014-09-12 ENCOUNTER — Encounter: Payer: Self-pay | Admitting: Family Medicine

## 2014-09-21 ENCOUNTER — Encounter: Payer: Self-pay | Admitting: Family Medicine

## 2014-09-24 ENCOUNTER — Telehealth: Payer: Self-pay | Admitting: Family Medicine

## 2014-09-24 NOTE — Telephone Encounter (Signed)
Pt is questioning wether she really needs to see the audiologist since the ENT did not find anything wrong. She said she would rather not have to pay the $50 to be seen there if she doesn't have to. Pt is requesting a call back.

## 2014-09-24 NOTE — Telephone Encounter (Signed)
It's really up to her on this.  If she wants to see audiology about testing, then proceed.  I'll defer to her.

## 2014-09-24 NOTE — Telephone Encounter (Signed)
Left message on voicemail to call back, 

## 2014-09-25 ENCOUNTER — Ambulatory Visit
Admission: RE | Admit: 2014-09-25 | Discharge: 2014-09-25 | Disposition: A | Discharge status: Home / Self Care | Payer: Medicare Other | Source: Ambulatory Visit | Attending: Family Medicine | Admitting: Family Medicine

## 2014-09-25 DIAGNOSIS — E2839 Other primary ovarian failure: Secondary | ICD-10-CM

## 2014-09-25 DIAGNOSIS — Z1231 Encounter for screening mammogram for malignant neoplasm of breast: Secondary | ICD-10-CM

## 2014-09-26 ENCOUNTER — Encounter: Payer: Self-pay | Admitting: *Deleted

## 2014-09-29 NOTE — Telephone Encounter (Signed)
Patient notified as instructed by telephone. Was advised by patient that she is going to cancel the appointment with the audiologist.

## 2014-10-23 ENCOUNTER — Encounter: Payer: Self-pay | Admitting: Family Medicine

## 2014-10-23 ENCOUNTER — Ambulatory Visit (INDEPENDENT_AMBULATORY_CARE_PROVIDER_SITE_OTHER)
Admission: RE | Admit: 2014-10-23 | Discharge: 2014-10-23 | Disposition: A | Discharge status: Home / Self Care | Payer: Medicare Other | Source: Ambulatory Visit | Attending: Family Medicine | Admitting: Family Medicine

## 2014-10-23 ENCOUNTER — Ambulatory Visit (INDEPENDENT_AMBULATORY_CARE_PROVIDER_SITE_OTHER): Payer: Medicare Other | Admitting: Family Medicine

## 2014-10-23 VITALS — BP 130/72 | HR 79 | Temp 98.5°F | Ht 62.0 in | Wt 177.2 lb

## 2014-10-23 DIAGNOSIS — M25561 Pain in right knee: Secondary | ICD-10-CM

## 2014-10-23 NOTE — Progress Notes (Signed)
Dr. Karleen Hampshire T. Colden Samaras, MD, CAQ [facility] [address] [place], [address] Phone: [phone] Fax: [phone]  10/23/2014  Patient: Natasha Woodard, MRN: [id number], DOB: [birth year], 73 y.o.  Primary Physician:  Crawford Givens, MD  Chief Complaint: Knee Pain  Subjective:   Natasha Woodard is a 73 y.o. very pleasant female patient who presents with the following:  1 month ago, was up late and doing the work on the computer. A week before, had a heavy coffee table.   Went into the kitchen and ran into the coffee table. Ran directly onto it. At that time, twisted her knee. Hurt her tailbone.   The history is not exact, but it sounds as if the patient is describing her initial injury where she fall, and she was seen by my partner in September 2015.  At that point it seems as if she primarily injured her tailbone, which was quite tender to palpation.  Currently, that is completely resolved.  Now, she is primarily complaining of posterior knee pain.  She is having pain primarily all on the RIGHT knee, and it is mostly posterior, but she also does have an effusion.  She does not recall any other specific injury.  No prior operative interventions on that knee. No giving way. Not using cane or walker.   Past Medical History, Surgical History, Social History, Family History, Problem List, Medications, and Allergies have been reviewed and updated if relevant.  GEN: No fevers, chills. Nontoxic. Primarily MSK c/o today. MSK: Detailed in the HPI GI: tolerating PO intake without difficulty Neuro: No numbness, parasthesias, or tingling associated. Otherwise the pertinent positives of the ROS are noted above.   Objective:   BP 130/72 mmHg  Pulse 79  Temp(Src) 98.5 F (36.9 C) (Oral)  Ht 5\' 2"  (1.575 m)  Wt 177 lb 4 oz (80.4 kg)  BMI 32.41 kg/m2   GEN: WDWN, NAD, Non-toxic, Alert & Oriented x 3 HEENT: Atraumatic, Normocephalic.  Ears and  Nose: No external deformity. EXTR: No clubbing/cyanosis/edema PSYCH: Normally interactive. Conversant. Not depressed or anxious appearing.  Calm demeanor.   Knee:  R Knee Gait: Normal heel toe pattern ROM: 0-110 Effusion: mild Echymosis or edema: none Patellar tendon NT Painful PLICA: neg Patellar grind: negative Medial and lateral patellar facet loading: negative medial and lateral joint lines: mild joint line pain Mcmurray's neg Flexion-pinch POS Varus and valgus stress: stable Lachman: neg Ant and Post drawer: neg Hip abduction, IR, ER: WNL Hip flexion str: 5/5 Hip abd: 5/5 Quad: 5/5 VMO atrophy:No Hamstring concentric and eccentric: 5/5   Radiology: Dg Knee Complete 4 Views Right  10/24/2014   CLINICAL DATA:  73 year old female with right knee pain. No history of injury.  EXAM: RIGHT KNEE - COMPLETE 4+ VIEW  COMPARISON:  None.  FINDINGS: No acute bony abnormality.  No significant soft tissue swelling.  No radiopaque foreign body.  No joint effusion.  Chondrocalcinosis at the knee joint. Arterial calcifications are present at the thigh and lower leg.  IMPRESSION: No acute abnormality identified.  Chondrocalcinosis, which is nonspecific though frequently seen in the setting of a metabolic disorders, such as CPPD or hyperparathyroidism.  Atherosclerosis.  Signed,  Yvone Neu. Loreta Ave, DO  [facility]  [facility]   Electronically Signed   By: Gilmer Mor D.O.   On: 10/24/2014 08:57     Assessment and Plan:   Right knee pain - Plan: DG Knee Complete 4 Views  Right  Historically, degenerative meniscal tear more likely fit.  On radiographs, I think that the patient may have a loose body that could be contributing, too. Minimal OA.  I would continue conservative management.  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 Cont low dose motrin and tramadol for now, too.   Follow-up: if not  better in 4 weeks  New Prescriptions   No medications on file   Orders Placed This Encounter  Procedures  . DG Knee Complete 4 Views Right    Signed,  Karleen HampshireSpencer T. Jeilani Grupe, MD   Patient's Medications  New Prescriptions   No medications on file  Previous Medications   AMLODIPINE (NORVASC) 5 MG TABLET    TAKE ONE TABLET BY MOUTH EVERY DAY   ASPIRIN 81 MG TABLET    Take 81 mg by mouth daily.     CHOLECALCIFEROL (VITAMIN D) 1000 UNITS CAPSULE    Take 1,000 Units by mouth daily.    ENALAPRIL (VASOTEC) 20 MG TABLET    TAKE ONE TABLET BY MOUTH ONCE DAILY   GLUCOSE BLOOD (ACCU-CHEK AVIVA PLUS) TEST STRIP    USE TO TEST BLOOD SUGAR ONCE DAILY FOR DM 250.00   IBUPROFEN (ADVIL,MOTRIN) 200 MG TABLET    Take 400 mg by mouth 2 (two) times daily.    LANCETS (ACCU-CHEK MULTICLIX) LANCETS    USE ONE LANCET TO TEST BLOOD GLUCOSE DAILY FOR DM 250.00   METFORMIN (GLUCOPHAGE) 500 MG TABLET    Take 1 tablet (500 mg total) by mouth 2 (two) times daily with a meal. take 1 tab twice a day.   TRAMADOL (ULTRAM) 50 MG TABLET    TAKE ONE TABLET BY MOUTH TWICE DAILY  Modified Medications   No medications on file  Discontinued Medications   No medications on file

## 2014-10-23 NOTE — Progress Notes (Signed)
Pre visit review using our clinic review tool, if applicable. No additional management support is needed unless otherwise documented below in the visit note. 

## 2014-10-27 ENCOUNTER — Telehealth: Payer: Self-pay | Admitting: Family Medicine

## 2014-10-27 DIAGNOSIS — M25569 Pain in unspecified knee: Secondary | ICD-10-CM

## 2014-10-27 NOTE — Telephone Encounter (Signed)
Pt was seen on Thursday 11/12 and received knee brace. Pt states she has not been able to wear it at all the is weekend and only half a day on Friday due it causing more pain. Pt states she was not measured for it and brace does not fit properly. It is very difficult to get on and off. Pt wants to know when she can bring brace back in and not be charged for the brace. Please advise.  [phone]

## 2014-10-27 NOTE — Telephone Encounter (Signed)
Can you call the patient and help sort this out? i am not exactly sure how to respond, i presume that she was sized when trying it on or it would not have fit in the office.

## 2014-10-27 NOTE — Telephone Encounter (Signed)
Left message for Natasha Woodard that I felt I fitted her with the right size knee brace while she was in the office but if she wanted to return the brace, I spoke the [employer] rep and he said that would be fine.

## 2014-10-28 NOTE — Telephone Encounter (Signed)
Pt returned knee brace. Placed on Donna's desk.

## 2014-10-29 NOTE — Telephone Encounter (Signed)
Pt left v/m requesting Dr Para March to review xrays pt had on 10/23/14; pt still having knee pain and pt returned knee brace because caused pt more knee pain. Pt request Dr Para March to suggest what pt needs to do now. Pt does not want same knee brace that she returned on 10/28/14. Pt request cb.

## 2014-10-30 NOTE — Telephone Encounter (Signed)
Notify pt. I talked with Dr. Patsy Lager.    He could do a steroid injection and that may help. He was trying to unload her posterior knee.  An ACL/MCL brace could also work.  It is up to the patient if she would like to see Dr. Patsy Lager or get a different opinion. He could easily see her next week.  Let me know if I need to refer her out.  Thanks.

## 2014-10-30 NOTE — Telephone Encounter (Signed)
Patient advised.   Patient prefers a referral to the [place] area and would like to have her x-rays sent also.

## 2014-10-30 NOTE — Addendum Note (Signed)
Addended by: [name] on: 10/30/2014 10:33 AM   Modules accepted: Orders

## 2014-10-30 NOTE — Telephone Encounter (Signed)
I reviewed the xrays and I agreed with what Dr. Patsy Lager did prev.  I'd ask for Dr. Cyndie Chime in put in the meantime, either to have him see her back or refer out for second opinion.

## 2014-10-30 NOTE — Telephone Encounter (Signed)
Referred.  Thanks.

## 2014-10-30 NOTE — Telephone Encounter (Signed)
My assessment and plan would essentially be the same from a few days ago.   It is not possible for her knee to be better in 2-3 days. I am not sure how much she understood my explanation of her problem. Unquestionably, her knee brace was fit correctly.  I would continue conservative care. We could do a steroid injection +/- this may help. I was trying to unload her posterior knee. An ACL/MCL brace could also work.   It is up to the patient if she would like to see me or get a different opinion. I could easily see her next week.

## 2014-11-11 ENCOUNTER — Encounter: Payer: Self-pay | Admitting: Family Medicine

## 2014-11-11 LAB — HEMOGLOBIN A1C: A1c: 6

## 2014-11-13 ENCOUNTER — Telehealth: Payer: Self-pay

## 2014-11-13 MED ORDER — NAPROXEN SODIUM 220 MG PO TABS: 220.0000 mg | ORAL_TABLET | Freq: Two times a day (BID) | ORAL | Status: DC

## 2014-11-13 NOTE — Telephone Encounter (Signed)
Patient advised.

## 2014-11-13 NOTE — Telephone Encounter (Signed)
Pt left v/m; pt taking 4 advil/day and 2 tramadol/day. Pt wants to stop advil and start taking aleve; pt wants to know if can take aleve and tramadol together for prolonged period of time. Pt request cb.

## 2014-11-13 NOTE — Telephone Encounter (Signed)
Stop ibuprofen, add on aleve 1 tab twice a day, continue tramadol. Okay to substitute aleve for ibuprofen, can take with tramadol. Thanks.

## 2014-11-17 ENCOUNTER — Other Ambulatory Visit: Payer: Self-pay | Admitting: Orthopedic Surgery

## 2014-12-09 ENCOUNTER — Encounter (HOSPITAL_BASED_OUTPATIENT_CLINIC_OR_DEPARTMENT_OTHER): Admission: RE | Payer: Self-pay | Source: Ambulatory Visit

## 2014-12-09 ENCOUNTER — Ambulatory Visit (HOSPITAL_BASED_OUTPATIENT_CLINIC_OR_DEPARTMENT_OTHER): Admission: RE | Admit: 2014-12-09 | Payer: Medicare Other | Source: Ambulatory Visit | Admitting: Orthopedic Surgery

## 2014-12-09 SURGERY — CARPAL TUNNEL RELEASE
Anesthesia: Choice | Laterality: Left

## 2015-01-01 ENCOUNTER — Telehealth: Payer: Self-pay | Admitting: *Deleted

## 2015-01-01 NOTE — Telephone Encounter (Signed)
Pt left voicemail at Triage. Pt said she has been having really "bad gas pain in her chest", pt said she has had this pain for about 1 week. Pt said she has seen Dr. Para March for this last year, and would like to get that medication again because it did give her some relief, but she isn't sure the name of the medication

## 2015-01-01 NOTE — Telephone Encounter (Signed)
It wasn't an rx  med in the last 2 years, I pulled the chart to review.   She does have a hiatal hernia, so she could try zantac 150mg  a day in the meantime.   Thanks.

## 2015-01-01 NOTE — Telephone Encounter (Signed)
Patient called back and I advised results 

## 2015-01-01 NOTE — Telephone Encounter (Signed)
Left detailed message on voicemail.  

## 2015-01-12 ENCOUNTER — Other Ambulatory Visit: Payer: Self-pay | Admitting: Family Medicine

## 2015-01-12 NOTE — Telephone Encounter (Signed)
Okay to continue prn, sent.

## 2015-01-12 NOTE — Telephone Encounter (Signed)
Electronic refill request. Last Filled:   ? 01/08/14.  Please advise.

## 2015-01-13 ENCOUNTER — Encounter: Payer: Self-pay | Admitting: Family Medicine

## 2015-01-13 ENCOUNTER — Ambulatory Visit (INDEPENDENT_AMBULATORY_CARE_PROVIDER_SITE_OTHER): Payer: Medicare Other | Admitting: Family Medicine

## 2015-01-13 VITALS — BP 142/72 | HR 78 | Temp 98.5°F

## 2015-01-13 DIAGNOSIS — L0100 Impetigo, unspecified: Secondary | ICD-10-CM

## 2015-01-13 MED ORDER — DOXYCYCLINE HYCLATE 100 MG PO TABS: 100.0000 mg | ORAL_TABLET | Freq: Two times a day (BID) | ORAL | Status: DC

## 2015-01-13 NOTE — Patient Instructions (Signed)
This looks like another flare of impetigo.  Restart the doxycycline and update me in about 2-3 days, sooner if needed.  Take care.  Glad to see you.

## 2015-01-13 NOTE — Progress Notes (Signed)
Pre visit review using our clinic review tool, if applicable. No additional management support is needed unless otherwise documented below in the visit note.  H/o impetigo years ago, did well on doxy at the time.  Now with itchy papules on the L upper arm, no dermatomal skin sensation changes.  No other new sx.  She has baseline dry skin with chronic changes noted.    Meds, vitals, and allergies reviewed.   ROS: See HPI.  Otherwise, noncontributory.  nad Dry skin with chronic changes noted at baseline with new clusters of papules on the L upper arm.  No fluctuant mass.  Normal skin sensation.

## 2015-01-14 ENCOUNTER — Telehealth: Payer: Self-pay | Admitting: Family Medicine

## 2015-01-14 NOTE — Telephone Encounter (Signed)
Pt called in today because she needs an extension on her handicap placard. Its due to expire at the end of this month. She was told to just call in and let us know and we could take care of the rest.

## 2015-01-14 NOTE — Telephone Encounter (Signed)
Spoke to patient and was advised that she needs Dr. Para March to fill out another handicap form like he filled out previously. Form is on you desk. Call patient when ready for pickup.

## 2015-01-14 NOTE — Assessment & Plan Note (Signed)
Likely, limited area.  Restart doxy, should resolve.  She is likely higher risk at baseline due to chronic skin changes.  D/w pt about moisturizing, esp in winter.  F/u prn.

## 2015-01-14 NOTE — Telephone Encounter (Signed)
Left message for patient to call back. Need more information; is this a temporary card or permanent?

## 2015-01-15 NOTE — Telephone Encounter (Signed)
Form done, in my outbox.  Thanks.  

## 2015-01-15 NOTE — Telephone Encounter (Signed)
I spoke with patient and she asked me to mail the form to her.  Form mailed.

## 2015-01-26 ENCOUNTER — Encounter: Payer: Self-pay | Admitting: Family Medicine

## 2015-01-27 DIAGNOSIS — H6123 Impacted cerumen, bilateral: Secondary | ICD-10-CM | POA: Diagnosis not present

## 2015-01-27 DIAGNOSIS — T161XXA Foreign body in right ear, initial encounter: Secondary | ICD-10-CM | POA: Diagnosis not present

## 2015-01-27 DIAGNOSIS — L299 Pruritus, unspecified: Secondary | ICD-10-CM | POA: Diagnosis not present

## 2015-02-27 ENCOUNTER — Other Ambulatory Visit: Payer: Self-pay | Admitting: Family Medicine

## 2015-02-27 NOTE — Telephone Encounter (Signed)
Electronic refill request. Last Filled:    180 tablet 1 RF on 08/19/2014  Please advise.

## 2015-03-01 NOTE — Telephone Encounter (Signed)
Please call in.  Thanks.   

## 2015-03-02 ENCOUNTER — Other Ambulatory Visit: Payer: Self-pay | Admitting: Family Medicine

## 2015-03-02 ENCOUNTER — Encounter: Payer: Self-pay | Admitting: Radiology

## 2015-03-02 ENCOUNTER — Other Ambulatory Visit (INDEPENDENT_AMBULATORY_CARE_PROVIDER_SITE_OTHER): Payer: Medicare Other

## 2015-03-02 DIAGNOSIS — E119 Type 2 diabetes mellitus without complications: Secondary | ICD-10-CM

## 2015-03-02 NOTE — Telephone Encounter (Signed)
Medication phoned to pharmacy.  

## 2015-03-03 ENCOUNTER — Ambulatory Visit: Payer: Self-pay | Admitting: Family Medicine

## 2015-03-03 LAB — HEMOGLOBIN A1C: Hgb A1c MFr Bld: 6.4 % (ref 4.6–6.5)

## 2015-03-04 ENCOUNTER — Ambulatory Visit: Payer: Medicare Other | Admitting: Family Medicine

## 2015-03-10 ENCOUNTER — Encounter: Payer: Self-pay | Admitting: Family Medicine

## 2015-03-10 ENCOUNTER — Ambulatory Visit (INDEPENDENT_AMBULATORY_CARE_PROVIDER_SITE_OTHER): Payer: Medicare Other | Admitting: Family Medicine

## 2015-03-10 VITALS — BP 122/82 | HR 72 | Temp 97.9°F | Wt 177.0 lb

## 2015-03-10 DIAGNOSIS — E119 Type 2 diabetes mellitus without complications: Secondary | ICD-10-CM

## 2015-03-10 DIAGNOSIS — Z23 Encounter for immunization: Secondary | ICD-10-CM

## 2015-03-10 NOTE — Progress Notes (Signed)
Pre visit review using our clinic review tool, if applicable. No additional management support is needed unless otherwise documented below in the visit note.  Diabetes:  Using medications without difficulties: yes Hypoglycemic episodes: no Hyperglycemic episodes: no Feet problems: no Blood Sugars averaging: ~110-125, usually closer to 125 eye exam within last year: yes A1c controlled.    Meds, vitals, and allergies reviewed.   ROS: See HPI.  Otherwise negative.    GEN: nad, alert and oriented HEENT: mucous membranes moist NECK: supple w/o LA CV: rrr. PULM: ctab, no inc wob ABD: soft, +bs EXT: no edema SKIN: at baseline with chronic changes.   Diabetic foot exam: Normal inspection except for chronic changes at baseline No skin breakdown No calluses  Normal DP pulses Normal sensation to light touch and monofilament Nails thickened.

## 2015-03-10 NOTE — Patient Instructions (Signed)
Recheck in about 6 months, labs before a wellness visit.  Take care.  Glad to see you.  Keep working on your diet and keep exercising.

## 2015-03-12 NOTE — Assessment & Plan Note (Signed)
Controlled, recheck in about 6 months.  No change in meds, meds and labs d/w pt.  Continue diet and exercise, d/w pt.  She agrees.

## 2015-03-25 ENCOUNTER — Telehealth: Payer: Self-pay

## 2015-03-25 NOTE — Telephone Encounter (Signed)
Patient notified as instructed by telephone and verbalized understanding. 

## 2015-03-25 NOTE — Telephone Encounter (Signed)
Pt left v/m requesting name of good vitamin pt could take; pt states she is 27 and feels tired and thinks a vitamin would help. Pt request cb.

## 2015-03-25 NOTE — Telephone Encounter (Signed)
I wouldn't take anything other than a generic/plain multivitamin.  If the fatigue continues, then please have her come in to discuss.  Thanks.

## 2015-05-25 ENCOUNTER — Telehealth: Payer: Self-pay

## 2015-05-25 NOTE — Telephone Encounter (Signed)
Please ask pharmacy to do the early refill.  It was sent 03/01/15, so it would be nearly due.  Thanks.

## 2015-05-25 NOTE — Telephone Encounter (Signed)
Katrina at [facility] called and pt is very upset that tramadol rx is not ready for pick up. Advised Katrina as instructed per phone note and Katrina voiced understanding.

## 2015-05-25 NOTE — Telephone Encounter (Signed)
Patient phoned in to speak to Natasha Woodard stating that she is completely out of medication and there is a refill remaining on her prescription but pharmacy is saying that it is too early to get the refill.  Patient was very upset and said she must have not gotten the correct number of pills because she has never ran out previously and no one else has any access to her medication.  Patient states she needs this medication today.

## 2015-05-25 NOTE — Telephone Encounter (Signed)
Pt left v/m; [facility] told pt that she cannot get tramadol refill until 05/28/15. Pt said she only takes 2 tramadol daily but pt is out of med. Pt has available refill at pharmacy but too early to refill.pt last seen 03/10/15 and last annual exam on 08/19/2014. Pt request cb to see if can get early refill of med that is already at pharmacy.

## 2015-05-25 NOTE — Telephone Encounter (Signed)
Please ask pharmacy to do the early refill. It was sent 03/01/15, so it would be nearly due.  she is a reliable patient.   Thanks.

## 2015-06-01 DIAGNOSIS — G5602 Carpal tunnel syndrome, left upper limb: Secondary | ICD-10-CM | POA: Diagnosis not present

## 2015-06-02 ENCOUNTER — Telehealth: Payer: Self-pay

## 2015-06-02 NOTE — Telephone Encounter (Signed)
Pt said she received call from Catrina at [facility] letting pt know she needed to get prevnar 13 and pt needed to be on a statin for heart healthiness. Pt was advised she had Prevnar 13 on 03/10/15. Pt said she told Catrina that she could not take statins; Catrina advised her she needed to be on a different statin. Pt request Dr Lianne Bushy opinion. Pt request cb. Pt has appt for lab on 08/18/15 and wellness exam on 08/21/15.Please advise.

## 2015-06-02 NOTE — Telephone Encounter (Signed)
error 

## 2015-06-03 ENCOUNTER — Other Ambulatory Visit: Payer: Self-pay | Admitting: Orthopedic Surgery

## 2015-06-03 NOTE — Telephone Encounter (Signed)
Failed mult statins, shouldn't take a statin.  Thanks.

## 2015-06-03 NOTE — Telephone Encounter (Signed)
Patient notified as instructed by telephone and verbalized understanding. 

## 2015-06-09 ENCOUNTER — Encounter (HOSPITAL_BASED_OUTPATIENT_CLINIC_OR_DEPARTMENT_OTHER): Payer: Self-pay | Admitting: *Deleted

## 2015-06-10 ENCOUNTER — Encounter (HOSPITAL_BASED_OUTPATIENT_CLINIC_OR_DEPARTMENT_OTHER)
Admission: RE | Admit: 2015-06-10 | Discharge: 2015-06-10 | Disposition: A | Discharge status: Home / Self Care | Payer: Medicare Other | Source: Ambulatory Visit | Attending: Orthopedic Surgery | Admitting: Orthopedic Surgery

## 2015-06-10 DIAGNOSIS — Z79891 Long term (current) use of opiate analgesic: Secondary | ICD-10-CM | POA: Diagnosis not present

## 2015-06-10 DIAGNOSIS — Z791 Long term (current) use of non-steroidal anti-inflammatories (NSAID): Secondary | ICD-10-CM | POA: Diagnosis not present

## 2015-06-10 DIAGNOSIS — J449 Chronic obstructive pulmonary disease, unspecified: Secondary | ICD-10-CM | POA: Diagnosis not present

## 2015-06-10 DIAGNOSIS — I10 Essential (primary) hypertension: Secondary | ICD-10-CM | POA: Diagnosis not present

## 2015-06-10 DIAGNOSIS — Q809 Congenital ichthyosis, unspecified: Secondary | ICD-10-CM | POA: Diagnosis not present

## 2015-06-10 DIAGNOSIS — E669 Obesity, unspecified: Secondary | ICD-10-CM | POA: Diagnosis not present

## 2015-06-10 DIAGNOSIS — E785 Hyperlipidemia, unspecified: Secondary | ICD-10-CM | POA: Diagnosis not present

## 2015-06-10 DIAGNOSIS — G5602 Carpal tunnel syndrome, left upper limb: Secondary | ICD-10-CM | POA: Diagnosis not present

## 2015-06-10 DIAGNOSIS — Z6831 Body mass index (BMI) 31.0-31.9, adult: Secondary | ICD-10-CM | POA: Diagnosis not present

## 2015-06-10 DIAGNOSIS — Z9889 Other specified postprocedural states: Secondary | ICD-10-CM | POA: Diagnosis not present

## 2015-06-10 DIAGNOSIS — E119 Type 2 diabetes mellitus without complications: Secondary | ICD-10-CM | POA: Diagnosis not present

## 2015-06-10 DIAGNOSIS — Z87891 Personal history of nicotine dependence: Secondary | ICD-10-CM | POA: Diagnosis not present

## 2015-06-10 DIAGNOSIS — Z79899 Other long term (current) drug therapy: Secondary | ICD-10-CM | POA: Diagnosis not present

## 2015-06-10 DIAGNOSIS — Z7982 Long term (current) use of aspirin: Secondary | ICD-10-CM | POA: Diagnosis not present

## 2015-06-10 LAB — BASIC METABOLIC PANEL
ANION GAP: 9 (ref 5–15)
BUN: 16 mg/dL (ref 6–20)
CHLORIDE: 105 mmol/L (ref 101–111)
CO2: 28 mmol/L (ref 22–32)
CREATININE: 0.82 mg/dL (ref 0.44–1.00)
Calcium: 9.4 mg/dL (ref 8.9–10.3)
GFR calc Af Amer: >60 mL/min (ref >60)
GFR calc non Af Amer: >60 mL/min (ref >60)
GLUCOSE: 97 mg/dL (ref 65–99)
POTASSIUM: 4 mmol/L (ref 3.5–5.1)
SODIUM: 142 mmol/L (ref 135–145)

## 2015-06-11 ENCOUNTER — Ambulatory Visit (HOSPITAL_BASED_OUTPATIENT_CLINIC_OR_DEPARTMENT_OTHER): Payer: Medicare Other | Admitting: Anesthesiology

## 2015-06-11 ENCOUNTER — Encounter (HOSPITAL_BASED_OUTPATIENT_CLINIC_OR_DEPARTMENT_OTHER)
Admission: RE | Disposition: A | Discharge status: Home / Self Care | Payer: Self-pay | Source: Ambulatory Visit | Attending: Orthopedic Surgery

## 2015-06-11 ENCOUNTER — Encounter (HOSPITAL_BASED_OUTPATIENT_CLINIC_OR_DEPARTMENT_OTHER): Payer: Self-pay | Admitting: Anesthesiology

## 2015-06-11 ENCOUNTER — Ambulatory Visit (HOSPITAL_BASED_OUTPATIENT_CLINIC_OR_DEPARTMENT_OTHER)
Admission: RE | Admit: 2015-06-11 | Discharge: 2015-06-11 | Disposition: A | Discharge status: Home / Self Care | Payer: Medicare Other | Source: Ambulatory Visit | Attending: Orthopedic Surgery | Admitting: Orthopedic Surgery

## 2015-06-11 DIAGNOSIS — E669 Obesity, unspecified: Secondary | ICD-10-CM | POA: Diagnosis not present

## 2015-06-11 DIAGNOSIS — G5602 Carpal tunnel syndrome, left upper limb: Secondary | ICD-10-CM | POA: Diagnosis not present

## 2015-06-11 DIAGNOSIS — Z791 Long term (current) use of non-steroidal anti-inflammatories (NSAID): Secondary | ICD-10-CM | POA: Insufficient documentation

## 2015-06-11 DIAGNOSIS — Z79899 Other long term (current) drug therapy: Secondary | ICD-10-CM | POA: Diagnosis not present

## 2015-06-11 DIAGNOSIS — J449 Chronic obstructive pulmonary disease, unspecified: Secondary | ICD-10-CM | POA: Diagnosis not present

## 2015-06-11 DIAGNOSIS — Q809 Congenital ichthyosis, unspecified: Secondary | ICD-10-CM | POA: Insufficient documentation

## 2015-06-11 DIAGNOSIS — E785 Hyperlipidemia, unspecified: Secondary | ICD-10-CM | POA: Diagnosis not present

## 2015-06-11 DIAGNOSIS — Z7982 Long term (current) use of aspirin: Secondary | ICD-10-CM | POA: Diagnosis not present

## 2015-06-11 DIAGNOSIS — Z6831 Body mass index (BMI) 31.0-31.9, adult: Secondary | ICD-10-CM | POA: Diagnosis not present

## 2015-06-11 DIAGNOSIS — Z9889 Other specified postprocedural states: Secondary | ICD-10-CM | POA: Insufficient documentation

## 2015-06-11 DIAGNOSIS — Z87891 Personal history of nicotine dependence: Secondary | ICD-10-CM | POA: Insufficient documentation

## 2015-06-11 DIAGNOSIS — E119 Type 2 diabetes mellitus without complications: Secondary | ICD-10-CM | POA: Insufficient documentation

## 2015-06-11 DIAGNOSIS — I10 Essential (primary) hypertension: Secondary | ICD-10-CM | POA: Insufficient documentation

## 2015-06-11 DIAGNOSIS — Z79891 Long term (current) use of opiate analgesic: Secondary | ICD-10-CM | POA: Insufficient documentation

## 2015-06-11 HISTORY — PX: CARPAL TUNNEL RELEASE: SHX101

## 2015-06-11 LAB — GLUCOSE, CAPILLARY
GLUCOSE-CAPILLARY: 87 mg/dL (ref 65–99)
Glucose-Capillary: 86 mg/dL (ref 65–99)

## 2015-06-11 LAB — POCT HEMOGLOBIN-HEMACUE: HEMOGLOBIN: 13.9 g/dL (ref 12.0–15.0)

## 2015-06-11 SURGERY — CARPAL TUNNEL RELEASE
Anesthesia: Monitor Anesthesia Care | Site: Wrist | Laterality: Left

## 2015-06-11 MED ORDER — BUPIVACAINE HCL (PF) 0.25 % IJ SOLN
INTRAMUSCULAR | Status: DC | PRN
  Administered 2015-06-11: 8 mL

## 2015-06-11 MED ORDER — PROPOFOL INFUSION 10 MG/ML OPTIME
INTRAVENOUS | Status: DC | PRN
  Administered 2015-06-11: 25 ug/kg/min via INTRAVENOUS

## 2015-06-11 MED ORDER — SCOPOLAMINE 1 MG/3DAYS TD PT72
1.0000 | MEDICATED_PATCH | Freq: Once | TRANSDERMAL | Status: DC | PRN
Start: 2015-06-11 — End: 2015-06-11

## 2015-06-11 MED ORDER — CHLORHEXIDINE GLUCONATE 4 % EX LIQD: 60.0000 mL | Freq: Once | CUTANEOUS | Status: DC

## 2015-06-11 MED ORDER — LACTATED RINGERS IV SOLN
INTRAVENOUS | Status: DC
  Administered 2015-06-11: 10:00:00 via INTRAVENOUS

## 2015-06-11 MED ORDER — FENTANYL CITRATE (PF) 100 MCG/2ML IJ SOLN
INTRAMUSCULAR | Status: DC | PRN
Start: 2015-06-11 — End: 2015-06-11
  Administered 2015-06-11 (×2): 50 ug via INTRAVENOUS

## 2015-06-11 MED ORDER — FENTANYL CITRATE (PF) 100 MCG/2ML IJ SOLN
INTRAMUSCULAR | Status: AC
  Filled 2015-06-11: qty 4

## 2015-06-11 MED ORDER — LIDOCAINE HCL (PF) 0.5 % IJ SOLN
INTRAMUSCULAR | Status: DC | PRN
  Administered 2015-06-11: 30 mL via INTRAVENOUS

## 2015-06-11 MED ORDER — ONDANSETRON HCL 4 MG/2ML IJ SOLN: 4.0000 mg | Freq: Four times a day (QID) | INTRAMUSCULAR | Status: DC | PRN

## 2015-06-11 MED ORDER — HYDROCODONE-ACETAMINOPHEN 5-325 MG PO TABS: 1.0000 | ORAL_TABLET | Freq: Four times a day (QID) | ORAL | Status: DC | PRN

## 2015-06-11 MED ORDER — GLYCOPYRROLATE 0.2 MG/ML IJ SOLN: 0.2000 mg | Freq: Once | INTRAMUSCULAR | Status: DC | PRN

## 2015-06-11 MED ORDER — CEFAZOLIN SODIUM-DEXTROSE 2-3 GM-% IV SOLR: 2.0000 g | INTRAVENOUS | Status: DC

## 2015-06-11 MED ORDER — FENTANYL CITRATE (PF) 100 MCG/2ML IJ SOLN: 25.0000 ug | INTRAMUSCULAR | Status: DC | PRN

## 2015-06-11 MED ORDER — MIDAZOLAM HCL 2 MG/2ML IJ SOLN: 1.0000 mg | INTRAMUSCULAR | Status: DC | PRN

## 2015-06-11 MED ORDER — CEFAZOLIN SODIUM-DEXTROSE 2-3 GM-% IV SOLR
2.0000 g | INTRAVENOUS | Status: AC
  Administered 2015-06-11: 2 g via INTRAVENOUS

## 2015-06-11 MED ORDER — OXYCODONE HCL 5 MG/5ML PO SOLN: 5.0000 mg | Freq: Once | ORAL | Status: DC | PRN

## 2015-06-11 MED ORDER — OXYCODONE HCL 5 MG PO TABS: 5.0000 mg | ORAL_TABLET | Freq: Once | ORAL | Status: DC | PRN

## 2015-06-11 MED ORDER — CEFAZOLIN SODIUM-DEXTROSE 2-3 GM-% IV SOLR
INTRAVENOUS | Status: AC
  Filled 2015-06-11: qty 50

## 2015-06-11 MED ORDER — FENTANYL CITRATE (PF) 100 MCG/2ML IJ SOLN: 50.0000 ug | INTRAMUSCULAR | Status: DC | PRN

## 2015-06-11 SURGICAL SUPPLY — 36 items
BLADE SURG 15 STRL LF DISP TIS (BLADE) ×1 IMPLANT
BLADE SURG 15 STRL SS (BLADE) ×2
BNDG CMPR 9X4 STRL LF SNTH (GAUZE/BANDAGES/DRESSINGS)
BNDG COHESIVE 3X5 TAN STRL LF (GAUZE/BANDAGES/DRESSINGS) ×2 IMPLANT
BNDG ESMARK 4X9 LF (GAUZE/BANDAGES/DRESSINGS) IMPLANT
BNDG GAUZE ELAST 4 BULKY (GAUZE/BANDAGES/DRESSINGS) ×2 IMPLANT
CHLORAPREP W/TINT 26ML (MISCELLANEOUS) ×2 IMPLANT
CORDS BIPOLAR (ELECTRODE) ×2 IMPLANT
COVER BACK TABLE 60X90IN (DRAPES) ×2 IMPLANT
COVER MAYO STAND STRL (DRAPES) ×2 IMPLANT
CUFF TOURNIQUET SINGLE 18IN (TOURNIQUET CUFF) ×2 IMPLANT
DRAPE EXTREMITY T 121X128X90 (DRAPE) ×2 IMPLANT
DRAPE SURG 17X23 STRL (DRAPES) ×2 IMPLANT
DRSG PAD ABDOMINAL 8X10 ST (GAUZE/BANDAGES/DRESSINGS) ×2 IMPLANT
GAUZE SPONGE 4X4 12PLY STRL (GAUZE/BANDAGES/DRESSINGS) ×2 IMPLANT
GAUZE XEROFORM 1X8 LF (GAUZE/BANDAGES/DRESSINGS) ×2 IMPLANT
GLOVE BIO SURGEON STRL SZ 6.5 (GLOVE) ×1 IMPLANT
GLOVE BIOGEL PI IND STRL 7.0 (GLOVE) IMPLANT
GLOVE BIOGEL PI IND STRL 8.5 (GLOVE) ×1 IMPLANT
GLOVE BIOGEL PI INDICATOR 7.0 (GLOVE) ×2
GLOVE BIOGEL PI INDICATOR 8.5 (GLOVE) ×1
GLOVE SURG ORTHO 8.0 STRL STRW (GLOVE) ×2 IMPLANT
GOWN STRL REUS W/ TWL LRG LVL3 (GOWN DISPOSABLE) ×1 IMPLANT
GOWN STRL REUS W/TWL LRG LVL3 (GOWN DISPOSABLE) ×2
GOWN STRL REUS W/TWL XL LVL3 (GOWN DISPOSABLE) ×2 IMPLANT
NDL PRECISIONGLIDE 27X1.5 (NEEDLE) IMPLANT
NEEDLE PRECISIONGLIDE 27X1.5 (NEEDLE) ×2 IMPLANT
NS IRRIG 1000ML POUR BTL (IV SOLUTION) ×2 IMPLANT
PACK BASIN DAY SURGERY FS (CUSTOM PROCEDURE TRAY) ×2 IMPLANT
STOCKINETTE 4X48 STRL (DRAPES) ×2 IMPLANT
SUT ETHILON 4 0 PS 2 18 (SUTURE) ×2 IMPLANT
SUT VICRYL 4-0 PS2 18IN ABS (SUTURE) IMPLANT
SYR BULB 3OZ (MISCELLANEOUS) ×2 IMPLANT
SYR CONTROL 10ML LL (SYRINGE) ×1 IMPLANT
TOWEL OR 17X24 6PK STRL BLUE (TOWEL DISPOSABLE) ×2 IMPLANT
UNDERPAD 30X30 (UNDERPADS AND DIAPERS) ×2 IMPLANT

## 2015-06-11 NOTE — H&P (Signed)
Natasha Woodard  is 74 years of age and right hand dominant. She has had a right carpal tunnel release in the past. She has positive nerve conductions with motor delay of 6.1 on the left, 9.4 on the right, sensory delay of 3.5 on the left, 4.3 on the right. Amplitude diminution to 15 on the left and 6.7 on the right. She is doing well with her right carpal tunnel release. She desires proceeding to have the left side done.  PAST MEDICAL HISTORY:  She is allergic to sulfa. She is on Tramadol, Advil, Enalapril, Amlodipine, Metformin, aspirin and vitamins. She has had rotator cuff surgery, hysterectomy, and carpal tunnel release.  FAMILY MEDICAL HISTORY: Positive for diabetes, high BP and arthritis.  SOCIAL HISTORY:  She does not smoke or use alcohol. She is retired.  REVIEW OF SYSTEMS: Positive for easy bleeding, otherwise negative 14 points. She does have a history of diabetes. Natasha Woodard is an 74 y.o. female.   Chief Complaint: CTS left HPI: see above  Past Medical History  Diagnosis Date  . Hypertension   . Hyperlipidemia     Statin intolerant  . Diabetes mellitus     Type II  . Hiatal hernia     with reflux  . Ichthyosis congenita   . Obesity   . Pulmonary nodule     Imaged multiple times and benign appearing  . Plantar fasciitis     Left  . NSVD (normal spontaneous vaginal delivery) 1971 & 1972  . Calcific tendonitis 2008    Treatment of left leg  . CTS (carpal tunnel syndrome)     worse on right hand/ wears wrist splint  . Wears glasses     Past Surgical History  Procedure Laterality Date  . Abdominal hysterectomy  1975    TAH-BSO  . Corn removal  1968    right  . Abscess drainage  1972    right breast  . Rotator cuff repair      left shoulder  . Colonoscopy    . Carpal tunnel release Right 07/02/2014    Procedure: RIGHT CARPAL TUNNEL RELEASE;  Surgeon: Wynonia Sours, MD;  Location: [facility];  Service: Orthopedics;  Laterality: Right;     Family History  Problem Relation Age of Onset  . Heart disease Mother   . Diabetes Mother   . Heart disease Father   . Diabetes Father   . Heart disease Brother     MI  . Cancer Brother     Lung  . Colon cancer Neg Hx   . Breast cancer Neg Hx    Social History:  reports that she quit smoking about 21 years ago. Her smoking use included Cigarettes. She quit after 28 years of use. She has never used smokeless tobacco. She reports that she does not drink alcohol or use illicit drugs.  Allergies:  Allergies  Allergen Reactions  . Ezetimibe-Simvastatin     REACTION: Muscle aches (side effect)  . Pravastatin Sodium     REACTION: Muscle aches (side effect)  . Sulfonamide Derivatives Nausea And Vomiting    No prescriptions prior to admission    Results for orders placed or performed during the hospital encounter of 06/11/15 (from the past 48 hour(s))  Basic metabolic panel     Status: None   Collection Time: 06/10/15  8:50 AM  Result Value Ref Range   Sodium 142 135 - 145 mmol/L   Potassium 4.0 3.5 - 5.1 mmol/L  Chloride 105 101 - 111 mmol/L   CO2 28 22 - 32 mmol/L   Glucose, Bld 97 65 - 99 mg/dL   BUN 16 6 - 20 mg/dL   Creatinine, Ser 0.82 0.44 - 1.00 mg/dL   Calcium 9.4 8.9 - 10.3 mg/dL   GFR calc non Af Amer >60 >60 mL/min   GFR calc Af Amer >60 >60 mL/min    Comment: (NOTE) The eGFR has been calculated using the CKD EPI equation. This calculation has not been validated in all clinical situations. eGFR's persistently <60 mL/min signify possible Chronic Kidney Disease.    Anion gap 9 5 - 15    No results found.   Pertinent items are noted in HPI.  Height _0  (1.575 m), weight 78.019 kg (172 lb).  General appearance: alert, cooperative and appears stated age Head: Normocephalic, without obvious abnormality Neck: no carotid bruit and no JVD Resp: clear to auscultation bilaterally Cardio: regular rate and rhythm, S1, S2 normal, no murmur, click, rub or  gallop GI: soft, non-tender; bowel sounds normal; no masses,  no organomegaly Extremities: numbnee left hand Pulses: 2+ and symmetric Skin: Skin color, texture, turgor normal. No rashes or lesions Neurologic: Grossly normal Incision/Wound: na  Assessment/Plan DIAGNOSIS: Carpal tunnel syndrome left hand.   Pre, peri and post op care are discussed along with risks and complications. Patient is aware there is no guarantee with surgery, possibility of infection, injury to arteries, nerves, and tendons, incomplete relief and dystrophy. She is scheduled for left carpal tunnel release as an outpatient under regional anesthesia.   Elchanan Bob R 06/11/2015, 7:48 AM

## 2015-06-11 NOTE — Op Note (Signed)
Dictation Number [id number]

## 2015-06-11 NOTE — Anesthesia Preprocedure Evaluation (Signed)
Anesthesia Evaluation  Patient identified by MRN, date of birth, ID band Patient awake    Reviewed: Allergy & Precautions, NPO status , Patient's Chart, lab work & pertinent test results  Airway Mallampati: II   Neck ROM: full    Dental   Pulmonary COPDformer smoker,  breath sounds clear to auscultation        Cardiovascular hypertension, Rhythm:regular Rate:Normal     Neuro/Psych  Neuromuscular disease    GI/Hepatic hiatal hernia,   Endo/Other  diabetes, Type 2  Renal/GU      Musculoskeletal   Abdominal   Peds  Hematology   Anesthesia Other Findings   Reproductive/Obstetrics                             Anesthesia Physical Anesthesia Plan  ASA: II  Anesthesia Plan: MAC and Bier Block   Post-op Pain Management:    Induction: Intravenous  Airway Management Planned: Simple Face Mask  Additional Equipment:   Intra-op Plan:   Post-operative Plan:   Informed Consent: I have reviewed the patients History and Physical, chart, labs and discussed the procedure including the risks, benefits and alternatives for the proposed anesthesia with the patient or authorized representative who has indicated his/her understanding and acceptance.     Plan Discussed with: CRNA, Surgeon and Anesthesiologist  Anesthesia Plan Comments:         Anesthesia Quick Evaluation

## 2015-06-11 NOTE — Discharge Instructions (Addendum)
[facility] Instructions °Hand Surgery ° °Wound Care: °Keep your hand elevated above the level of your heart.  Do not allow it to dangle by your side.  Keep the dressing dry and do not remove it unless your doctor advises you to do so.  He will usually change it at the time of your post-op visit.  Moving your fingers is advised to stimulate circulation but will depend on the site of your surgery.  If you have a splint applied, your doctor will advise you regarding movement. ° °Activity: °Do not drive or operate machinery today.  Rest today and then you may return to your normal activity and work as indicated by your physician. ° °Diet:  °Drink liquids today or eat a light diet.  You may resume a regular diet tomorrow.   ° °General expectations: °Pain for two to three days. °Fingers may become slightly swollen. ° °Call your doctor if any of the following occur: °Severe pain not relieved by pain medication. °Elevated temperature. °Dressing soaked with blood. °Inability to move fingers. °White or bluish color to fingers. ° ° ° °Post Anesthesia Home Care Instructions ° °Activity: °Get plenty of rest for the remainder of the day. A responsible adult should stay with you for 24 hours following the procedure.  °For the next 24 hours, DO NOT: °-Drive a car °-Operate machinery °-Drink alcoholic beverages °-Take any medication unless instructed by your physician °-Make any legal decisions or sign important papers. ° °Meals: °Start with liquid foods such as gelatin or soup. Progress to regular foods as tolerated. Avoid greasy, spicy, heavy foods. If nausea and/or vomiting occur, drink only clear liquids until the nausea and/or vomiting subsides. Call your physician if vomiting continues. ° °Special Instructions/Symptoms: °Your throat may feel dry or sore from the anesthesia or the breathing tube placed in your throat during surgery. If this causes discomfort, gargle with warm salt water. The discomfort should disappear within  24 hours. ° °If you had a scopolamine patch placed behind your ear for the management of post- operative nausea and/or vomiting: ° °1. The medication in the patch is effective for 72 hours, after which it should be removed.  Wrap patch in a tissue and discard in the trash. Wash hands thoroughly with soap and water. °2. You may remove the patch earlier than 72 hours if you experience unpleasant side effects which may include dry mouth, dizziness or visual disturbances. °3. Avoid touching the patch. Wash your hands with soap and water after contact with the patch. °  °      HAND SURGERY ° °  HOME CARE INSTRUCTIONS ° ° ° °The following instructions have been prepared to help you care for yourself upon your return home today. ° °Wound Care:  °Keep your hand elevated above the level of your heart. Do not allow it to dangle by your side. Keep the dressing dry and do not remove it unless your doctor advises you to do so. He will usually change it at the time of you post-op visit. Moving your fingers is advised to stimulate circulation but will depend on the site of your surgery. Of course, if you have a splint applied your doctor will advise you about movement. ° °Activity:  °Do not drive or operate machinery today. Rest today and then you may return to your normal activity and work as indicated by your physician. ° °Diet: °Drink liquids today or eat a light diet. You may resume a regular diet tomorrow. ° °General   expectations: °Pain for two or three days. °Fingers may become slightly swollen.  ° °Unexpected Observations- Call your doctor if any of these occur: °Severe pain not relieved by pain medication. °Elevated temperature. °Dressing soaked with blood. °Inability to move fingers. °White or bluish color to fingers. ° ° ° ° ° °

## 2015-06-11 NOTE — Transfer of Care (Signed)
Immediate Anesthesia Transfer of Care Note  Patient: Natasha Woodard  Procedure(s) Performed: Procedure(s) with comments: LEFT CARPAL TUNNEL RELEASE (Left) - REGIONAL/FAB  Patient Location: PACU  Anesthesia Type:Bier block  Level of Consciousness: awake and patient cooperative  Airway & Oxygen Therapy: Patient Spontanous Breathing and Patient connected to face mask oxygen  Post-op Assessment: Report given to RN and Post -op Vital signs reviewed and stable  Post vital signs: Reviewed and stable  Last Vitals:  Filed Vitals:   06/11/15 0920  BP: 157/88  Pulse: 74  Temp: 36.6 C  Resp: 18    Complications: No apparent anesthesia complications

## 2015-06-11 NOTE — Anesthesia Postprocedure Evaluation (Signed)
Anesthesia Post Note  Patient: Natasha Woodard  Procedure(s) Performed: Procedure(s) (LRB): LEFT CARPAL TUNNEL RELEASE (Left)  Anesthesia type: MAC  Patient location: PACU  Post pain: Pain level controlled and Adequate analgesia  Post assessment: Post-op Vital signs reviewed, Patient's Cardiovascular Status Stable and Respiratory Function Stable  Last Vitals:  Filed Vitals:   06/11/15 1145  BP:   Pulse: 58  Temp:   Resp: 20    Post vital signs: Reviewed and stable  Level of consciousness: awake, alert  and oriented  Complications: No apparent anesthesia complications

## 2015-06-11 NOTE — Brief Op Note (Signed)
06/11/2015  10:49 AM  PATIENT:  Natasha Woodard  74 y.o. female  PRE-OPERATIVE DIAGNOSIS:  LEFT CARPAL TUNNEL SYNDROME  POST-OPERATIVE DIAGNOSIS:  left carpal tunnel syndrome  PROCEDURE:  Procedure(s) with comments: LEFT CARPAL TUNNEL RELEASE (Left) - REGIONAL/FAB  SURGEON:  Surgeon(s) and Role:    * Cindee Salt, MD - Primary  PHYSICIAN ASSISTANT:   ASSISTANTS: none   ANESTHESIA:   local and regional  EBL:     BLOOD ADMINISTERED:none  DRAINS: none   LOCAL MEDICATIONS USED:  BUPIVICAINE   SPECIMEN:  No Specimen  DISPOSITION OF SPECIMEN:  N/A  COUNTS:  YES  TOURNIQUET:   Total Tourniquet Time Documented: Forearm (Left) - 13 minutes Total: Forearm (Left) - 13 minutes   DICTATION: .Other Dictation: Dictation Number [id number]  PLAN OF CARE: Discharge to home after PACU  PATIENT DISPOSITION:  PACU - hemodynamically stable.

## 2015-06-12 ENCOUNTER — Encounter (HOSPITAL_BASED_OUTPATIENT_CLINIC_OR_DEPARTMENT_OTHER): Payer: Self-pay | Admitting: Orthopedic Surgery

## 2015-06-12 NOTE — Op Note (Signed)
NAME:  Natasha Woodard, Natasha Woodard NO.:  [id number]  MEDICAL RECORD NO.:  [id number]  LOCATION:                                 FACILITY:  PHYSICIAN:  Cindee Salt, M.D.            DATE OF BIRTH:  DATE OF PROCEDURE:  06/11/2015 DATE OF DISCHARGE:                              OPERATIVE REPORT   PREOPERATIVE DIAGNOSIS:  Carpal tunnel syndrome, left hand.  POSTOPERATIVE DIAGNOSIS:  Carpal tunnel syndrome, left hand.  OPERATION:  Decompression of left median nerve.  SURGEON:  Cindee Salt, M.D.  ANESTHESIA:  Forearm-based IV regional with local infiltration.  ANESTHESIOLOGIST:  Achille Rich, MD.  HISTORY:  The patient is a 74 year old female with a history of carpal tunnel syndrome.  Nerve conduction is positive.  This has not responded to conservative treatment.  She is to undergo surgical decompression. Pre, peri, and postoperative course have been discussed along with risks and complications.  She is aware there is no guarantee with the surgery, possibility of infection; recurrence of injury to arteries, nerves, tendons, incomplete relief of symptoms, and dystrophy.  In the preoperative area, the patient is seen, the extremity marked by both the patient and surgeon.  Antibiotic given.  PROCEDURE IN DETAIL:  The patient was brought to the operating room, where forearm-based IV regional anesthetic was carried out without difficulty.  She was prepped using ChloraPrep, supine position, left arm free.  A 3-minute dry time was allowed.  Time-out taken, confirming the patient and procedure.  A longitudinal incision was made in the left palm, carried down through subcutaneous tissue.  Bleeders were electrocauterized.  Palmar fascia was split.  Superficial palmar arch identified.  Flexor tendon to the ring and little finger identified to the ulnar side of the median nerve.  Carpal retinaculum was incised with sharp dissection after placement of retractors protecting both  median and ulnar nerves.  Right angle and Sewall retractors were placed between skin and forearm fascia.  The nerve dissected from the overlying fascia. The subcutaneous tissue separated and the forearm fascia was then released for approximately 2 to 3 cm proximal to the wrist crease under direct vision.  Canal was explored.  Air compression to the nerve was apparent.  Motor branch entered into muscle.  No further lesions were identified.  The wound was irrigated.  The skin closed with interrupted 4-0 nylon sutures.  A local infiltration with 0.25% bupivacaine without epinephrine was given, 8 to 9 mL was used.  Sterile compressive dressing with fingers free was applied.  On deflation of the tourniquet, all fingers immediately pinked.  She was taken to the recovery room for observation in a satisfactory condition.  She will be discharged home to return to the [facility] [place] in 1 week on [place].          ______________________________ Cindee Salt, M.D.     GK/MEDQ  D:  06/11/2015  T:  06/12/2015  Job:  [id number]

## 2015-06-29 ENCOUNTER — Encounter: Payer: Self-pay | Admitting: Family Medicine

## 2015-06-30 ENCOUNTER — Other Ambulatory Visit: Payer: Self-pay | Admitting: Family Medicine

## 2015-06-30 DIAGNOSIS — H9190 Unspecified hearing loss, unspecified ear: Secondary | ICD-10-CM

## 2015-07-06 LAB — FECAL OCCULT BLOOD, GUAIAC: FECAL OCCULT BLD: NEGATIVE

## 2015-07-16 ENCOUNTER — Encounter: Payer: Self-pay | Admitting: Family Medicine

## 2015-07-21 DIAGNOSIS — H903 Sensorineural hearing loss, bilateral: Secondary | ICD-10-CM | POA: Diagnosis not present

## 2015-07-22 ENCOUNTER — Encounter: Payer: Self-pay | Admitting: Family Medicine

## 2015-08-12 ENCOUNTER — Other Ambulatory Visit: Payer: Self-pay | Admitting: Family Medicine

## 2015-08-12 DIAGNOSIS — E119 Type 2 diabetes mellitus without complications: Secondary | ICD-10-CM

## 2015-08-18 ENCOUNTER — Other Ambulatory Visit (INDEPENDENT_AMBULATORY_CARE_PROVIDER_SITE_OTHER): Payer: Medicare Other

## 2015-08-18 DIAGNOSIS — E119 Type 2 diabetes mellitus without complications: Secondary | ICD-10-CM

## 2015-08-18 LAB — COMPREHENSIVE METABOLIC PANEL
ALT: 13 U/L (ref 0–35)
AST: 18 U/L (ref 0–37)
Albumin: 4.1 g/dL (ref 3.5–5.2)
Alkaline Phosphatase: 57 U/L (ref 39–117)
BUN: 14 mg/dL (ref 6–23)
CO2: 32 meq/L (ref 19–32)
Calcium: 9.9 mg/dL (ref 8.4–10.5)
Chloride: 106 mEq/L (ref 96–112)
Creatinine, Ser: 0.72 mg/dL (ref 0.40–1.20)
GFR: 101.56 mL/min (ref >60.00)
GLUCOSE: 93 mg/dL (ref 70–99)
POTASSIUM: 5.1 meq/L (ref 3.5–5.1)
Sodium: 144 mEq/L (ref 135–145)
Total Bilirubin: 0.4 mg/dL (ref 0.2–1.2)
Total Protein: 6.8 g/dL (ref 6.0–8.3)

## 2015-08-18 LAB — LIPID PANEL
CHOL/HDL RATIO: 4
Cholesterol: 175 mg/dL (ref 0–200)
HDL: 43.1 mg/dL (ref >39.00)
LDL Cholesterol: 109 mg/dL — ABNORMAL HIGH (ref 0–99)
NONHDL: 131.91
Triglycerides: 114 mg/dL (ref 0.0–149.0)
VLDL: 22.8 mg/dL (ref 0.0–40.0)

## 2015-08-18 LAB — HEMOGLOBIN A1C: Hgb A1c MFr Bld: 5.9 % (ref 4.6–6.5)

## 2015-08-21 ENCOUNTER — Encounter: Payer: Self-pay | Admitting: Family Medicine

## 2015-08-21 ENCOUNTER — Ambulatory Visit (INDEPENDENT_AMBULATORY_CARE_PROVIDER_SITE_OTHER): Payer: Medicare Other | Admitting: Family Medicine

## 2015-08-21 VITALS — BP 120/80 | HR 78 | Temp 98.4°F | Ht 62.0 in | Wt 171.0 lb

## 2015-08-21 DIAGNOSIS — E119 Type 2 diabetes mellitus without complications: Secondary | ICD-10-CM

## 2015-08-21 DIAGNOSIS — Z0000 Encounter for general adult medical examination without abnormal findings: Secondary | ICD-10-CM | POA: Diagnosis not present

## 2015-08-21 DIAGNOSIS — I10 Essential (primary) hypertension: Secondary | ICD-10-CM

## 2015-08-21 MED ORDER — METFORMIN HCL 500 MG PO TABS: 500.0000 mg | ORAL_TABLET | Freq: Every day | ORAL | Status: DC

## 2015-08-21 MED ORDER — ENALAPRIL MALEATE 20 MG PO TABS: ORAL_TABLET | ORAL | Status: DC

## 2015-08-21 MED ORDER — AMLODIPINE BESYLATE 5 MG PO TABS: ORAL_TABLET | ORAL | Status: DC

## 2015-08-21 NOTE — Progress Notes (Signed)
Pre visit review using our clinic review tool, if applicable. No additional management support is needed unless otherwise documented below in the visit note.  I have personally reviewed the Medicare Annual Wellness questionnaire and have noted 1. The patient's medical and social history 2. Their use of alcohol, tobacco or illicit drugs 3. Their current medications and supplements 4. The patient's functional ability including ADL's, fall risks, home safety risks and hearing or visual             impairment. 5. Diet and physical activities 6. Evidence for depression or mood disorders  The patients weight, height, BMI have been recorded in the chart and visual acuity is per eye clinic.  I have made referrals, counseling and provided education to the patient based review of the above and I have provided the pt with a written personalized care plan for preventive services.  Provider list updated- see scanned forms.  Routine anticipatory guidance given to patient.  See health maintenance.  [facility] this fall Shingles 2008 PNA 2016 Tetanus 2007 Colonoscopy 2014 Breast cancer screening- d/w pt.  She'll consider.  Encouraged.  DXA 2015 Advance directive- husband designated if patient were incapacitated.  Cognitive function addressed- see scanned forms- and if abnormal then additional documentation follows.   Diabetes:  Using medications without difficulties: yes, but occ lows recently noted.  Hypoglycemic episodes: see above Hyperglycemic episodes: no Feet problems:no Blood Sugars averaging: ~100 usually eye exam within last year: yes, f/u pending for 09/2015 per patient report.  A1c improved, d/w pt.  See plan.   Hypertension:    Using medication without problems or lightheadedness: yes Chest pain with exertion:no Edema:no Short of breath:no  PMH and SH reviewed  Meds, vitals, and allergies reviewed.   ROS: See HPI.  Otherwise negative.     GEN: nad, alert and oriented HEENT: mucous membranes moist NECK: supple w/o LA CV: rrr. PULM: ctab, no inc wob ABD: soft, +bs EXT: no edema SKIN: no acute rash  Diabetic foot exam: Normal inspection No skin breakdown No calluses  Normal DP pulses Normal sensation to light touch and monofilament Nails normal except for R 1st nail absent.

## 2015-08-21 NOTE — Patient Instructions (Addendum)
Congrats.  Your A1c was below 6.  Cut back to 1 metformin a day.   Recheck sugar before a visit in about 6 months.   Update me if need in the meantime, if you continue to have low sugars.  I would get a flu shot each fall.   Take care.  Glad to see you.

## 2015-08-21 NOTE — Assessment & Plan Note (Signed)
Controlled, now overtreated, will dec metformin to 1 pill a day, may still need to taper off med.  She can update me as needed.  Okay for outpatient fu.  Labs d/w pt. Continue work on diet and exercise.   I thanked her for her effort.

## 2015-08-21 NOTE — Assessment & Plan Note (Signed)
Controlled, no low BPs.  Continue as is.  D/w pt.  She agrees.  Labs d/w pt. Cr stable.

## 2015-08-21 NOTE — Assessment & Plan Note (Signed)
Flu shot [facility] be done [facility] later this fall  Shingles 2008  PNA 2016  Tetanus 2007  Colonoscopy 2014  Breast cancer screening- d/w pt. She'll consider. Encouraged.  DXA 2015  Advance directive- husband designated if patient were incapacitated.  Cognitive function addressed- see scanned forms- and if abnormal then additional documentation follows.

## 2015-08-24 ENCOUNTER — Other Ambulatory Visit: Payer: Self-pay | Admitting: Family Medicine

## 2015-08-24 NOTE — Telephone Encounter (Signed)
Pt left v/m requesting refill tramadol  to [facility]. rx last refilled # 180 x1 on 03/01/15. Last annual exam on 08/21/15. Pt is out of med. Pt request cb.

## 2015-08-24 NOTE — Telephone Encounter (Signed)
Please call in.  Thanks.  There were 31 days in Mar, May, July and August.   That puts her right at the due date.

## 2015-08-24 NOTE — Telephone Encounter (Signed)
Not due until 09/01/15. Can wait for Dr. Para March

## 2015-08-25 ENCOUNTER — Other Ambulatory Visit: Payer: Self-pay | Admitting: Family Medicine

## 2015-08-25 NOTE — Telephone Encounter (Signed)
Rx called in as directed.   

## 2015-09-22 DIAGNOSIS — E119 Type 2 diabetes mellitus without complications: Secondary | ICD-10-CM | POA: Diagnosis not present

## 2015-10-07 ENCOUNTER — Other Ambulatory Visit: Payer: Self-pay

## 2015-10-07 DIAGNOSIS — Z1231 Encounter for screening mammogram for malignant neoplasm of breast: Secondary | ICD-10-CM

## 2015-11-18 ENCOUNTER — Ambulatory Visit: Payer: Medicare Other

## 2015-11-21 ENCOUNTER — Other Ambulatory Visit: Payer: Self-pay | Admitting: Family Medicine

## 2015-11-23 NOTE — Telephone Encounter (Signed)
Spoke with pharmacist at [facility] and when rx called in on 08/24/15 [facility] did not hear additional refill given on 08/24/15. Updated with refill approved on 08/24/15. Pt notified and will ck with pharmacy later today.

## 2015-12-03 ENCOUNTER — Ambulatory Visit
Admission: RE | Admit: 2015-12-03 | Discharge: 2015-12-03 | Disposition: A | Discharge status: Home / Self Care | Payer: Medicare Other | Source: Ambulatory Visit

## 2015-12-03 ENCOUNTER — Ambulatory Visit (INDEPENDENT_AMBULATORY_CARE_PROVIDER_SITE_OTHER): Payer: Medicare Other | Admitting: Internal Medicine

## 2015-12-03 ENCOUNTER — Encounter: Payer: Self-pay | Admitting: Internal Medicine

## 2015-12-03 ENCOUNTER — Ambulatory Visit: Payer: Medicare Other | Admitting: Internal Medicine

## 2015-12-03 VITALS — BP 132/78 | HR 80 | Temp 98.6°F | Wt 176.0 lb

## 2015-12-03 DIAGNOSIS — Z1231 Encounter for screening mammogram for malignant neoplasm of breast: Secondary | ICD-10-CM | POA: Diagnosis not present

## 2015-12-03 DIAGNOSIS — B372 Candidiasis of skin and nail: Secondary | ICD-10-CM | POA: Diagnosis not present

## 2015-12-03 MED ORDER — NYSTATIN 100000 UNIT/GM EX POWD: Freq: Four times a day (QID) | CUTANEOUS | Status: DC

## 2015-12-03 NOTE — Progress Notes (Signed)
Pre visit review using our clinic review tool, if applicable. No additional management support is needed unless otherwise documented below in the visit note. 

## 2015-12-03 NOTE — Progress Notes (Signed)
Subjective:    Patient ID: Natasha Woodard, female    DOB: [birth year], 74 y.o.   MRN: [id number]  HPI  Pt presents to the clinic today with c/o itching in her bilateral groins. This started 3-4 weeks. She is not able to see a rash. She has tried several OTC medications without any relief. She denies changes in soaps, lotions or detergents. She does have a history of DM 2. Her last A1C was 5.9 %.  Review of Systems      Past Medical History  Diagnosis Date  . Hypertension   . Hyperlipidemia     Statin intolerant  . Diabetes mellitus     Type II  . Hiatal hernia     with reflux  . Ichthyosis congenita   . Obesity   . Pulmonary nodule     Imaged multiple times and benign appearing  . Plantar fasciitis     Left  . NSVD (normal spontaneous vaginal delivery) 1971 & 1972  . Calcific tendonitis 2008    Treatment of left leg  . CTS (carpal tunnel syndrome)     worse on right hand/ wears wrist splint  . Wears glasses     Current Outpatient Prescriptions  Medication Sig Dispense Refill  . amLODipine (NORVASC) 5 MG tablet TAKE ONE TABLET BY MOUTH EVERY DAY 90 tablet 3  . aspirin 81 MG tablet Take 81 mg by mouth daily.      . Cholecalciferol (VITAMIN D) 1000 UNITS capsule Take 1,000 Units by mouth daily.     . enalapril (VASOTEC) 20 MG tablet TAKE ONE TABLET BY MOUTH ONCE DAILY 90 tablet 3  . glucose blood (ACCU-CHEK AVIVA PLUS) test strip USE TO TEST BLOOD SUGAR ONCE DAILY FOR DM 250.00 100 each 3  . HYDROcodone-acetaminophen (NORCO) 5-325 MG per tablet Take 1 tablet by mouth every 6 (six) hours as needed for moderate pain. 30 tablet 0  . ibuprofen (ADVIL,MOTRIN) 200 MG tablet Take 400-600 mg by mouth 2 (two) times daily as needed.    . Lancets (ACCU-CHEK MULTICLIX) lancets USE ONE LANCET TO TEST BLOOD GLUCOSE DAILY FOR DM 250.00 102 each 3  . metFORMIN (GLUCOPHAGE) 500 MG tablet Take 1 tablet (500 mg total) by mouth daily with breakfast. 90 tablet 3  . traMADol (ULTRAM) 50 MG  tablet TAKE ONE TABLET BY MOUTH TWICE DAILY 180 tablet 1   No current facility-administered medications for this visit.    Allergies  Allergen Reactions  . Ezetimibe-Simvastatin     REACTION: Muscle aches (side effect)  . Pravastatin Sodium     REACTION: Muscle aches (side effect)  . Sulfonamide Derivatives Nausea And Vomiting    Family History  Problem Relation Age of Onset  . Heart disease Mother   . Diabetes Mother   . Heart disease Father   . Diabetes Father   . Heart disease Brother     MI  . Cancer Brother     Lung  . Colon cancer Neg Hx   . Breast cancer Neg Hx     Social History   Social History  . Marital Status: Married    Spouse Name: N/A  . Number of Children: 2  . Years of Education: N/A   Occupational History  .     Social History Main Topics  . Smoking status: Former Smoker -- 28 years    Types: Cigarettes    Quit date: 12/12/1993  . Smokeless tobacco: Never Used  . Alcohol Use: No  .  Drug Use: No  . Sexual Activity: Not on file   Other Topics Concern  . Not on file   Social History Narrative   Two kids, two grandchildren.    Married 1969   Retired.      Constitutional: Denies fever, malaise, fatigue, headache or abrupt weight changes.  Respiratory: Denies difficulty breathing, shortness of breath, cough or sputum production.   Cardiovascular: Denies chest pain, chest tightness, palpitations or swelling in the hands or feet.  Skin: Pt reports itching of groins. Denies ulcercations.    No other specific complaints in a complete review of systems (except as listed in HPI above).  Objective:   Physical Exam   BP 132/78 mmHg  Pulse 80  Temp(Src) 98.6 F (37 C) (Oral)  Wt 176 lb (79.833 kg)  SpO2 97% Wt Readings from Last 3 Encounters:  12/03/15 176 lb (79.833 kg)  08/21/15 171 lb (77.565 kg)  06/11/15 172 lb (78.019 kg)    General: Appears her stated age, obese in NAD. Skin: Warm, dry and intact. No rashes noted in  bilateral groins. Area is moist.  BMET    Component Value Date/Time   NA 144 08/18/2015 0941   K 5.1 08/18/2015 0941   CL 106 08/18/2015 0941   CO2 32 08/18/2015 0941   GLUCOSE 93 08/18/2015 0941   BUN 14 08/18/2015 0941   CREATININE 0.72 08/18/2015 0941   CALCIUM 9.9 08/18/2015 0941   GFRNONAA >60 06/10/2015 0850   GFRAA >60 06/10/2015 0850    Lipid Panel     Component Value Date/Time   CHOL 175 08/18/2015 0941   TRIG 114.0 08/18/2015 0941   HDL 43.10 08/18/2015 0941   CHOLHDL 4 08/18/2015 0941   VLDL 22.8 08/18/2015 0941   LDLCALC 109* 08/18/2015 0941    CBC    Component Value Date/Time   WBC 6.2 08/15/2012 1658   RBC 4.64 08/15/2012 1658   HGB 13.9 06/11/2015 0930   HCT 40.1 08/15/2012 1658   PLT 451.0* 08/15/2012 1658   MCV 86.4 08/15/2012 1658   MCHC 32.5 08/15/2012 1658   RDW 15.5* 08/15/2012 1658   LYMPHSABS 2.0 08/15/2012 1658   MONOABS 0.6 08/15/2012 1658   EOSABS 0.2 08/15/2012 1658   BASOSABS 0.0 08/15/2012 1658    Hgb A1C Lab Results  Component Value Date   HGBA1C 5.9 08/18/2015        Assessment & Plan:   Candida Intertrigo:  eRx for Nystatin powder After bathing, pat dry then apply the powder Avoid rubbing if you can  Follow up with PCP if symptoms persist or worsen

## 2015-12-03 NOTE — Patient Instructions (Signed)
Intertrigo Intertrigo is a skin condition that occurs in between folds of skin in places on the body that rub together a lot and do not get much ventilation. It is caused by heat, moisture, friction, sweat retention, and lack of air circulation, which produces red, irritated patches and, sometimes, scaling or drainage. People who have diabetes, who are obese, or who have treatment with antibiotics are at increased risk for intertrigo. The most common sites for intertrigo to occur include:  The groin.  The breasts.  The armpits.  Folds of abdominal skin.  Webbed spaces between the fingers or toes. Intertrigo may be aggravated by:  Sweat.  Feces.  Yeast or bacteria that are present near skin folds.  Urine.  Vaginal discharge. HOME CARE INSTRUCTIONS  The following steps can be taken to reduce friction and keep the affected area cool and dry:  Expose skin folds to the air.  Keep deep skin folds separated with cotton or linen cloth. Avoid tight fitting clothing that could cause chafing.  Wear open-toed shoes or sandals to help reduce moisture between the toes.  Apply absorbent powders to affected areas as directed by your caregiver.  Apply over-the-counter barrier pastes, such as zinc oxide, as directed by your caregiver.  If you develop a fungal infection in the affected area, your caregiver may have you use antifungal creams. SEEK MEDICAL CARE IF:   The rash is not improving after 1 week of treatment.  The rash is getting worse (more red, more swollen, more painful, or spreading).  You have a fever or chills. MAKE SURE YOU:   Understand these instructions.  Will watch your condition.  Will get help right away if you are not doing well or get worse.   This information is not intended to replace advice given to you by your health care provider. Make sure you discuss any questions you have with your health care provider.   Document Released: 11/28/2005 Document  Revised: 02/20/2012 Document Reviewed: 06/01/2015 Elsevier Interactive Patient Education 2016 [employer]  

## 2015-12-08 ENCOUNTER — Encounter: Payer: Self-pay | Admitting: *Deleted

## 2015-12-16 ENCOUNTER — Telehealth: Payer: Self-pay

## 2015-12-16 MED ORDER — FLUCONAZOLE 150 MG PO TABS: 150.0000 mg | ORAL_TABLET | Freq: Once | ORAL | Status: DC

## 2015-12-16 NOTE — Telephone Encounter (Signed)
Patient notified as instructed by telephone and verbalized understanding. 

## 2015-12-16 NOTE — Telephone Encounter (Signed)
Use diflucan and f/u if not better.  Thanks.

## 2015-12-16 NOTE — Telephone Encounter (Signed)
Pt was seen on 12/03/15 for intertrigo; pt has finished using the powder given and pt still having itching in both groin areas;  R Baity NP mentioned possible oral med if this did not work but for pt to f/u with PCP. Pt request different med [facility]. Pt request cb.

## 2015-12-30 ENCOUNTER — Ambulatory Visit (INDEPENDENT_AMBULATORY_CARE_PROVIDER_SITE_OTHER): Payer: Medicare Other | Admitting: Family Medicine

## 2015-12-30 ENCOUNTER — Encounter: Payer: Self-pay | Admitting: Family Medicine

## 2015-12-30 VITALS — BP 138/80 | HR 86 | Temp 99.1°F | Wt 179.5 lb

## 2015-12-30 DIAGNOSIS — J069 Acute upper respiratory infection, unspecified: Secondary | ICD-10-CM

## 2015-12-30 DIAGNOSIS — B372 Candidiasis of skin and nail: Secondary | ICD-10-CM | POA: Diagnosis not present

## 2015-12-30 MED ORDER — NYSTATIN 100000 UNIT/GM EX POWD: CUTANEOUS | Status: DC

## 2015-12-30 MED ORDER — FLUCONAZOLE 150 MG PO TABS: 150.0000 mg | ORAL_TABLET | ORAL | Status: DC

## 2015-12-30 MED ORDER — BENZONATATE 200 MG PO CAPS: 200.0000 mg | ORAL_CAPSULE | Freq: Three times a day (TID) | ORAL | Status: DC | PRN

## 2015-12-30 NOTE — Progress Notes (Signed)
Pre visit review using our clinic review tool, if applicable. No additional management support is needed unless otherwise documented below in the visit note.  Cough for about 3 days.  No FCNAVD.  No rash.  No sputum.  No ear pain.  Some ST, mild.  No facial pain.  Taking alka seltzer plus and cough drops for the cough, with some mild relief for a little while.    Itching in the groin.  Some better with nystatin, some better still with diflucan, but not resolved.  No rash, no discharge.  No bleeding.  No pain, just itching.  She has had this prev but usually wasn't prolonged.    Meds, vitals, and allergies reviewed.   ROS: See HPI.  Otherwise, noncontributory.  GEN: nad, alert and oriented HEENT: mucous membranes moist, tm w/o erythema, nasal exam w/o erythema, clear discharge noted,  OP with cobblestoning NECK: supple w/o LA CV: rrr.   PULM: ctab, no inc wob EXT: no edema SKIN: chaperoned exam. Chronic skin changes noted on the skin diffusely.  She has mild candidal changes on the B inguinal areas, with skin folds apposed.

## 2015-12-30 NOTE — Patient Instructions (Signed)
Use the nystatin powder until better and then for a few more days.  Take diflucan weekly in the meantime.  Use tessalon for the cough and let me know if you don't improve.  Take care.  Glad to see you.

## 2015-12-31 DIAGNOSIS — L089 Local infection of the skin and subcutaneous tissue, unspecified: Secondary | ICD-10-CM | POA: Insufficient documentation

## 2015-12-31 DIAGNOSIS — J069 Acute upper respiratory infection, unspecified: Secondary | ICD-10-CM | POA: Insufficient documentation

## 2015-12-31 DIAGNOSIS — B369 Superficial mycosis, unspecified: Secondary | ICD-10-CM | POA: Insufficient documentation

## 2015-12-31 DIAGNOSIS — B372 Candidiasis of skin and nail: Principal | ICD-10-CM

## 2015-12-31 NOTE — Assessment & Plan Note (Signed)
Use nystatin powder until better and then for a few more days.  Take diflucan weekly in the meantime.  She'll update me as needed.

## 2015-12-31 NOTE — Assessment & Plan Note (Signed)
Likely viral.  Nontoxic.   Use tessalon for the cough and she'll let me know if she doesn't improve.  She agrees.

## 2016-02-06 ENCOUNTER — Other Ambulatory Visit: Payer: Self-pay | Admitting: Family Medicine

## 2016-02-07 ENCOUNTER — Other Ambulatory Visit: Payer: Self-pay | Admitting: Family Medicine

## 2016-02-07 DIAGNOSIS — E119 Type 2 diabetes mellitus without complications: Secondary | ICD-10-CM

## 2016-02-08 NOTE — Telephone Encounter (Signed)
Electronic refill request. Last Filled:    180 tablet 1 08/24/2015  Last office visit:   12/30/15.  Please advise.

## 2016-02-09 NOTE — Telephone Encounter (Signed)
Please call in.  Thanks.   

## 2016-02-09 NOTE — Telephone Encounter (Signed)
Medication phoned to pharmacy.  

## 2016-02-15 ENCOUNTER — Other Ambulatory Visit (INDEPENDENT_AMBULATORY_CARE_PROVIDER_SITE_OTHER): Payer: Medicare Other

## 2016-02-15 DIAGNOSIS — E119 Type 2 diabetes mellitus without complications: Secondary | ICD-10-CM | POA: Diagnosis not present

## 2016-02-15 LAB — HEMOGLOBIN A1C: Hgb A1c MFr Bld: 6.4 % (ref 4.6–6.5)

## 2016-02-19 ENCOUNTER — Ambulatory Visit: Payer: Medicare Other | Admitting: Family Medicine

## 2016-03-08 ENCOUNTER — Ambulatory Visit: Payer: Medicare Other | Admitting: Family Medicine

## 2016-03-24 ENCOUNTER — Ambulatory Visit: Payer: Medicare Other | Admitting: Family Medicine

## 2016-03-30 ENCOUNTER — Ambulatory Visit: Payer: Medicare Other | Admitting: Family Medicine

## 2016-04-01 ENCOUNTER — Telehealth: Payer: Self-pay | Admitting: Family Medicine

## 2016-04-01 ENCOUNTER — Ambulatory Visit: Payer: Medicare Other | Admitting: Family Medicine

## 2016-04-01 NOTE — Telephone Encounter (Signed)
I spoke to Dr.Duncan.  He asked for patient to circle the 5 year.  I notified patient.

## 2016-04-01 NOTE — Telephone Encounter (Signed)
Patient had her disability placard form filled out yesterday.  On the form, it wasn't checked if it is permanent,temporary (1-6 months),or 5 years.  Patient said she'd like the 5 years. Patient would like to take it to the [facility] this morning.  Please call back as soon as possible.

## 2016-06-06 ENCOUNTER — Ambulatory Visit (INDEPENDENT_AMBULATORY_CARE_PROVIDER_SITE_OTHER): Payer: Medicare Other | Admitting: Family Medicine

## 2016-06-06 ENCOUNTER — Encounter: Payer: Self-pay | Admitting: Family Medicine

## 2016-06-06 ENCOUNTER — Ambulatory Visit: Payer: Medicare Other | Admitting: Family Medicine

## 2016-06-06 VITALS — BP 128/60 | HR 84 | Temp 98.8°F | Wt 175.8 lb

## 2016-06-06 DIAGNOSIS — E119 Type 2 diabetes mellitus without complications: Secondary | ICD-10-CM

## 2016-06-06 NOTE — Patient Instructions (Addendum)
Go to the lab on the way out.  We'll contact you with your lab report. Take care.  Glad to see you.  Stop the ibuprofen for about 1 week and see how you feel.   Update me as needed.

## 2016-06-06 NOTE — Progress Notes (Signed)
Pre visit review using our clinic review tool, if applicable. No additional management support is needed unless otherwise documented below in the visit note.  Diabetes:  Using medications without difficulties:yes Hypoglycemic episodes:no Hyperglycemic episodes:no Feet problems: no Blood Sugars averaging: 110-120s usually eye exam within last year: due for fall 2017, d/w pt.   She is working on diet and exercise.  Due for A1c.    She is checking on getting hearing aids.    Taking advil with food and tramadol for pain.  D/w pt. She had some nausea recently, unclear if from ibuprofen.  No BRBPR.  No black stools.  No abd pain.   Meds, vitals, and allergies reviewed.   ROS: Per HPI unless specifically indicated in ROS section   GEN: nad, alert and oriented HEENT: mucous membranes moist NECK: supple w/o LA CV: rrr. PULM: ctab, no inc wob ABD: soft, +bs EXT: no edema SKIN: no acute rash but chronic skin changes noted.   Diabetic foot exam: Normal inspection No skin breakdown No calluses  Normal DP pulses Normal sensation to light touch and monofilament Nails normal

## 2016-06-06 NOTE — Assessment & Plan Note (Signed)
eye exam within last year: due for fall 2017, d/w pt.   She is working on diet and exercise.  Due for A1c.   See notes on labs.  Possible that nausea could be from ibuprofen use.  Change back to tylenol for now, avoid nsaids and then update me.  She agrees.  Benign exam, okay for outpatient f/u.

## 2016-06-07 LAB — HEMOGLOBIN A1C: Hgb A1c MFr Bld: 6.3 % (ref 4.6–6.5)

## 2016-07-15 DIAGNOSIS — H938X2 Other specified disorders of left ear: Secondary | ICD-10-CM | POA: Diagnosis not present

## 2016-07-15 DIAGNOSIS — H6122 Impacted cerumen, left ear: Secondary | ICD-10-CM | POA: Diagnosis not present

## 2016-07-15 DIAGNOSIS — H6123 Impacted cerumen, bilateral: Secondary | ICD-10-CM | POA: Insufficient documentation

## 2016-07-15 DIAGNOSIS — H9193 Unspecified hearing loss, bilateral: Secondary | ICD-10-CM | POA: Diagnosis not present

## 2016-08-19 ENCOUNTER — Other Ambulatory Visit: Payer: Self-pay

## 2016-08-19 ENCOUNTER — Other Ambulatory Visit: Payer: Self-pay | Admitting: Family Medicine

## 2016-08-19 NOTE — Telephone Encounter (Signed)
Pt left v/m requesting refill tramadol (last refilled # 180 x 1 on 02/09/16) pt last seen 06/06/16. [facility].

## 2016-08-19 NOTE — Telephone Encounter (Signed)
Electronic refill request.  Please advise. 

## 2016-08-21 MED ORDER — TRAMADOL HCL 50 MG PO TABS: 50.0000 mg | ORAL_TABLET | Freq: Two times a day (BID) | ORAL | 1 refills | Status: DC | PRN

## 2016-08-21 NOTE — Telephone Encounter (Signed)
See other refill note.  

## 2016-08-21 NOTE — Telephone Encounter (Signed)
Please call in.  Thanks.   

## 2016-08-22 ENCOUNTER — Other Ambulatory Visit: Payer: Self-pay | Admitting: Family Medicine

## 2016-08-22 NOTE — Telephone Encounter (Signed)
PLEASE NOTE: All timestamps contained within this report are represented as [place] Standard Time. CONFIDENTIALTY NOTICE: This fax transmission is intended only for the addressee. It contains information that is legally privileged, confidential or otherwise protected from use or disclosure. If you are not the intended recipient, you are strictly prohibited from reviewing, disclosing, copying using or disseminating any of this information or taking any action in reliance on or regarding this information. If you have received this fax in error, please notify us immediately by telephone so that we can arrange for its return to us. Phone: [phone], Toll-Free: [phone], Fax: [phone] Page: 1 of 2 Call Id: [id number] [facility] Client TELEPHONE ADVICE RECORD [facility] Patient Name: Natasha GeroldCARRIE Woodard Gender: Female DOB: [birth year] Age: [age] Y 11 M 25 D Return Phone Number: [phone] (Primary) Address: City/State/Zip: [place] Client [facility] Day Physician Raechel Acheuncan, Shaw - MD Contact Type Call Who Is Calling Patient / Member / Family / Caregiver Call Type Triage / Clinical Relationship To Patient Self Return Phone Number [phone] (Primary) Chief Complaint Prescription Refill or Medication Request (non symptomatic) Reason for Call Symptomatic / Request for Health Information Initial Comment Caller states she called in for her Rx on Thursday and it is still not called in. She states that the pharmacy called 2 different times. Appointment Disposition EMR Appointment Not Necessary Info pasted into Epic No PreDisposition Call Doctor Translation No Nurse Assessment Nurse: Lane HackerHarley, RN, Elvin SoWindy Date/Time Lamount Cohen(Eastern Time): 08/20/2016 2:47:27 PM Confirm and document reason for call. If symptomatic, describe symptoms. You must click the next button to save  text entered. ---Caller states that she takes Tramadol for right knee pain, rates now at 6/10. Flares up with walking. Has the patient traveled out of the country within the last 30 days? ---Not Applicable Does the patient have any new or worsening symptoms? ---Yes Will a triage be completed? ---Yes Related visit to physician within the last 2 weeks? ---No Does the PT have any chronic conditions? (i.e. diabetes, asthma, etc.) ---Yes List chronic conditions. ---old right knee injury 2 years ago, and arthritis in knee, HTN, DM Is this a behavioral health or substance abuse call? ---No Nurse: Lane HackerHarley, RN, Windy Date/Time (Eastern Time): 08/20/2016 2:47:43 PM Please select the assessment type ---Request for controlled medication refill Additional Documentation ---Caller states she called the office for her Tramadol BID to be refilled on Thursday and it is still not called in. She states that the pharmacy called 2 different times. She takes it regularly for Right Knee pain and some lower back pain. Last dose of med was yesterday. PLEASE NOTE: All timestamps contained within this report are represented as [place] Standard Time. CONFIDENTIALTY NOTICE: This fax transmission is intended only for the addressee. It contains information that is legally privileged, confidential or otherwise protected from use or disclosure. If you are not the intended recipient, you are strictly prohibited from reviewing, disclosing, copying using or disseminating any of this information or taking any action in reliance on or regarding this information. If you have received this fax in error, please notify us immediately by telephone so that we can arrange for its return to us. Phone: [phone], Toll-Free: [phone], Fax: [phone] Page: 2 of 2 Call Id: [id number] Nurse Assessment Is there an on-call physician for the client? ---Yes Do the client directives specifically allow for paging the on-call regarding  scheduled drugs? ---No Additional  Documentation ---RN advised that request would be sent to office for her refill, but because it is a controlled substance, she would need to call back when office reopens for the refill. Caller verb. understanding. Guidelines Guideline Title Affirmed Question Affirmed Notes Nurse Date/Time Lamount Cohen Time) Knee Pain [1] MILD pain (e.g., does not interfere with normal activities) AND [2] present > 7 days Lane Hacker, [place], Elvin So 08/20/2016 2:51:07 PM Disp. Time Lamount Cohen Time) Disposition Final User 08/20/2016 2:54:09 PM See PCP within 2 Weeks Yes Lane Hacker, RN, Coralyn Mark Understands: Yes Disagree/Comply: Comply Care Advice Given Per Guideline SEE PCP WITHIN 2 WEEKS: You need an evaluation for this ongoing problem within the next 2 weeks. Call your doctor during regular office hours and make an appointment. REST YOUR KNEE for the next couple days: * Avoid activities that worsen your pain. LOCAL HEAT: Apply a warm washcloth or heating pad for 10 minutes three times daily to help increase circulation and improve healing. PAIN MEDICINES: * For pain relief, take acetaminophen, ibuprofen, or naproxen. * Before taking any medicine, read all the instructions on the package. CALL BACK IF: * Fever or severe knee pain occurs * Redness or severe swelling occurs * You become worse. CARE ADVICE given per Knee Pain (Adult) guideline.

## 2016-08-22 NOTE — Telephone Encounter (Signed)
Medication phoned to [facility] pharmacy as instructed. Unable to reach pt and per DPR left detailed message refill done.

## 2016-08-23 NOTE — Telephone Encounter (Signed)
If this was already called in, then please sign off and close the note.  Thanks.

## 2016-09-02 ENCOUNTER — Ambulatory Visit (INDEPENDENT_AMBULATORY_CARE_PROVIDER_SITE_OTHER): Payer: Medicare Other

## 2016-09-02 DIAGNOSIS — Z23 Encounter for immunization: Secondary | ICD-10-CM

## 2016-09-05 ENCOUNTER — Other Ambulatory Visit: Payer: Medicare Other

## 2016-09-05 ENCOUNTER — Ambulatory Visit: Payer: Medicare Other

## 2016-09-06 DIAGNOSIS — Z01118 Encounter for examination of ears and hearing with other abnormal findings: Secondary | ICD-10-CM | POA: Diagnosis not present

## 2016-09-14 ENCOUNTER — Other Ambulatory Visit: Payer: Self-pay | Admitting: Family Medicine

## 2016-09-14 ENCOUNTER — Ambulatory Visit: Payer: Medicare Other

## 2016-09-14 ENCOUNTER — Other Ambulatory Visit: Payer: Medicare Other

## 2016-09-14 DIAGNOSIS — E119 Type 2 diabetes mellitus without complications: Secondary | ICD-10-CM

## 2016-09-14 NOTE — Telephone Encounter (Signed)
Left message on voicemail for patient to call back. 

## 2016-09-14 NOTE — Telephone Encounter (Signed)
Ordered CMET lipid A1c.  I'll see her as scheduled.  Would still have her schedule with [facility] later on.  Thanks.

## 2016-09-14 NOTE — Telephone Encounter (Signed)
Pt request refill metformin to [facility]; pt seen 06/06/16. Refilled per protocol and pt voiced understanding. Pt also wanted to know if she could come very early on 09/15/16 for lab test and cancel the annual wellness with Misty Stanley. 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 Pt said since seeing Dr Para March on 09/22/16 pt does not think she needs to see Misty Stanley. Pt request cb.

## 2016-09-15 ENCOUNTER — Other Ambulatory Visit (INDEPENDENT_AMBULATORY_CARE_PROVIDER_SITE_OTHER): Payer: Medicare Other

## 2016-09-15 ENCOUNTER — Ambulatory Visit (INDEPENDENT_AMBULATORY_CARE_PROVIDER_SITE_OTHER): Payer: Medicare Other

## 2016-09-15 VITALS — BP 118/79 | HR 79 | Temp 98.4°F | Ht 61.0 in | Wt 173.2 lb

## 2016-09-15 DIAGNOSIS — Z0000 Encounter for general adult medical examination without abnormal findings: Secondary | ICD-10-CM | POA: Diagnosis not present

## 2016-09-15 DIAGNOSIS — E119 Type 2 diabetes mellitus without complications: Secondary | ICD-10-CM | POA: Diagnosis not present

## 2016-09-15 LAB — HEMOGLOBIN A1C: Hgb A1c MFr Bld: 6.3 % (ref 4.6–6.5)

## 2016-09-15 LAB — COMPREHENSIVE METABOLIC PANEL
ALT: 14 U/L (ref 0–35)
AST: 20 U/L (ref 0–37)
Albumin: 3.9 g/dL (ref 3.5–5.2)
Alkaline Phosphatase: 63 U/L (ref 39–117)
BUN: 10 mg/dL (ref 6–23)
CALCIUM: 9.4 mg/dL (ref 8.4–10.5)
CO2: 32 mEq/L (ref 19–32)
Chloride: 103 mEq/L (ref 96–112)
Creatinine, Ser: 0.86 mg/dL (ref 0.40–1.20)
GFR: 82.49 mL/min (ref >60.00)
Glucose, Bld: 90 mg/dL (ref 70–99)
POTASSIUM: 4.9 meq/L (ref 3.5–5.1)
Sodium: 140 mEq/L (ref 135–145)
TOTAL PROTEIN: 7.2 g/dL (ref 6.0–8.3)
Total Bilirubin: 0.4 mg/dL (ref 0.2–1.2)

## 2016-09-15 LAB — LIPID PANEL
CHOL/HDL RATIO: 5
Cholesterol: 183 mg/dL (ref 0–200)
HDL: 39.3 mg/dL (ref >39.00)
LDL CALC: 118 mg/dL — AB (ref 0–99)
NonHDL: 143.64
TRIGLYCERIDES: 129 mg/dL (ref 0.0–149.0)
VLDL: 25.8 mg/dL (ref 0.0–40.0)

## 2016-09-15 NOTE — Progress Notes (Signed)
PCP notes:   Health maintenance:  Eye - pt has future appt scheduled for 09/27/16 Flu - pt stated she had flu shot on 09/02/16  Abnormal screenings:   None  Patient concerns:   None  Nurse concerns:  None  Next PCP appt:   09/22/16 @ 1600   I reviewed health advisor's note, was available for consultation on the day of service listed in this note, and agree with documentation and plan. Crawford Givens, MD.

## 2016-09-15 NOTE — Telephone Encounter (Signed)
Left detailed message on voicemail.  

## 2016-09-15 NOTE — Progress Notes (Signed)
Subjective:   Natasha Woodard is a 75 y.o. female who presents for Medicare Annual (Subsequent) preventive examination.  Review of Systems:  N/A Cardiac Risk Factors include: advanced age (>6955men, 70>65 women);diabetes mellitus;obesity (BMI >30kg/m2);dyslipidemia;hypertension     Objective:     Vitals: BP 118/79 (BP Location: Right Arm, Patient Position: Sitting, Cuff Size: Normal)   Pulse 79   Temp 98.4 F (36.9 C) (Oral)   Ht 5\' 1"  (1.549 m)   Wt 173 lb 4 oz (78.6 kg)   SpO2 94%   BMI 32.74 kg/m   Body mass index is 32.74 kg/m.   Tobacco History  Smoking Status  . Former Smoker  . Years: 28.00  . Types: Cigarettes  . Quit date: 12/12/1993  Smokeless Tobacco  . Never Used     Counseling given: No   Past Medical History:  Diagnosis Date  . Calcific tendonitis 2008   Treatment of left leg  . CTS (carpal tunnel syndrome)    worse on right hand/ wears wrist splint  . Diabetes mellitus    Type II  . Hiatal hernia    with reflux  . Hyperlipidemia    Statin intolerant  . Hypertension   . Ichthyosis congenita   . NSVD (normal spontaneous vaginal delivery) 1971 & 1972  . Obesity   . Plantar fasciitis    Left  . Pulmonary nodule    Imaged multiple times and benign appearing  . Wears glasses    Past Surgical History:  Procedure Laterality Date  . ABDOMINAL HYSTERECTOMY  1975   TAH-BSO  . ABSCESS DRAINAGE  1972   right breast  . CARPAL TUNNEL RELEASE Right 07/02/2014   Procedure: RIGHT CARPAL TUNNEL RELEASE;  Surgeon: Nicki ReaperGary R Kuzma, MD;  Location: [facility];  Service: Orthopedics;  Laterality: Right;  . CARPAL TUNNEL RELEASE Left 06/11/2015   Procedure: LEFT CARPAL TUNNEL RELEASE;  Surgeon: Cindee SaltGary Kuzma, MD;  Location: [facility];  Service: Orthopedics;  Laterality: Left;  REGIONAL/FAB  . COLONOSCOPY    . corn removal  1968   right  . ROTATOR CUFF REPAIR     left shoulder   Family History  Problem Relation Age of Onset  .  Heart disease Mother   . Diabetes Mother   . Heart disease Father   . Diabetes Father   . Heart disease Brother     MI  . Cancer Brother     Lung  . Colon cancer Neg Hx   . Breast cancer Neg Hx    History  Sexual Activity  . Sexual activity: No    Outpatient Encounter Prescriptions as of 09/15/2016  Medication Sig  . amLODipine (NORVASC) 5 MG tablet TAKE ONE TABLET BY MOUTH EVERY DAY  . aspirin 81 MG tablet Take 81 mg by mouth daily.    . Cholecalciferol (VITAMIN D) 1000 UNITS capsule Take 1,000 Units by mouth daily.   . enalapril (VASOTEC) 20 MG tablet TAKE ONE TABLET BY MOUTH ONCE DAILY  . glucose blood (ACCU-CHEK AVIVA PLUS) test strip USE TO TEST BLOOD SUGAR ONCE DAILY FOR DM 250.00  . ibuprofen (ADVIL,MOTRIN) 200 MG tablet Take 400 mg by mouth 2 (two) times daily as needed (with food).  . Lancets (ACCU-CHEK MULTICLIX) lancets USE ONE LANCET TO TEST BLOOD GLUCOSE DAILY FOR DM 250.00  . metFORMIN (GLUCOPHAGE) 500 MG tablet TAKE ONE TABLET BY MOUTH ONCE DAILY WITH BREAKFAST  . traMADol (ULTRAM) 50 MG tablet Take 1 tablet (50  mg total) by mouth every 12 (twelve) hours as needed.  . [DISCONTINUED] metFORMIN (GLUCOPHAGE) 500 MG tablet Take 500 mg by mouth daily with breakfast.   No facility-administered encounter medications on file as of 09/15/2016.     Activities of Daily Living In your present state of health, do you have any difficulty performing the following activities: 09/15/2016  Hearing? Y  Vision? N  Difficulty concentrating or making decisions? N  Walking or climbing stairs? N  Dressing or bathing? N  Doing errands, shopping? N  Preparing Food and eating ? N  Using the Toilet? N  In the past six months, have you accidently leaked urine? N  Do you have problems with loss of bowel control? N  Managing your Medications? N  Managing your Finances? N  Housekeeping or managing your Housekeeping? N  Some recent data might be hidden    Patient Care Team: [name], MD as PCP - General    Assessment:    Hearing Screening Comments: Audiology exam in 08/2016 with ENT office. Test results revealed mild hearing loss Vision Screening Comments: Last vision exam in Sept 2016. Pt has future appt scheduled 09/27/2016  Exercise Activities and Dietary recommendations Current Exercise Habits: Home exercise routine, Type of exercise: walking;Other - see comments (water aerobics), Time (Minutes): 60, Frequency (Times/Week): 3, Weekly Exercise (Minutes/Week): 180, Intensity: Moderate, Exercise limited by: None identified  Goals    . Increase physical activity          Starting 09/15/2016, I will continue to exercise for at least 60 min 3 days per week.       Fall Risk Fall Risk  09/15/2016 08/19/2014 07/30/2013  Falls in the past year? No No No   Depression Screen PHQ 2/9 Scores 09/15/2016 08/19/2014 07/30/2013  PHQ - 2 Score 0 0 0     Cognitive Testing MMSE - Mini Mental State Exam 09/15/2016  Orientation to time 5  Orientation to Place 5  Registration 3  Attention/ Calculation 0  Recall 3  Language- name 2 objects 0  Language- repeat 1  Language- follow 3 step command 3  Language- read & follow direction 0  Write a sentence 0  Copy design 0  Total score 20   PLEASE NOTE: A Mini-Cog screen was completed. Maximum score is 20. A value of 0 denotes this part of Folstein MMSE was not completed or the patient failed this part of the Mini-Cog screening.   Mini-Cog Screening Orientation to Time - Max 5 pts Orientation to Place - Max 5 pts Registration - Max 3 pts Recall - Max 3 pts Language Repeat - Max 1 pts Language Follow 3 Step Command - Max 3 pts  Immunization History  Administered Date(s) Administered  . Influenza Whole 08/12/2010  . Influenza,inj,Quad PF,36+ Mos 08/19/2014, 08/15/2015  . Pneumococcal Conjugate-13 03/10/2015  . Pneumococcal Polysaccharide-23 05/26/2006  . Td 06/14/2007  . Zoster 08/10/2006   Screening Tests Health  Maintenance  Topic Date Due  . OPHTHALMOLOGY EXAM  12/11/2016 (Originally 08/21/2015)  . HEMOGLOBIN A1C  12/06/2016  . FOOT EXAM  06/06/2017  . TETANUS/TDAP  06/13/2017  . INFLUENZA VACCINE  Addressed  . DEXA SCAN  Completed  . ZOSTAVAX  Completed  . PNA vac Low Risk Adult  Completed      Plan:     I have personally reviewed and addressed the Medicare Annual Wellness questionnaire and have noted the following in the patient's chart:  A. Medical and social history B.  Use of alcohol, tobacco or illicit drugs  C. Current medications and supplements D. Functional ability and status E.  Nutritional status F.  Physical activity G. Advance directives H. List of other physicians I.  Hospitalizations, surgeries, and ER visits in previous 12 months J.  Vitals K. Screenings to include hearing, vision, cognitive, depression L. Referrals and appointments - none  In addition, I have reviewed and discussed with patient certain preventive protocols, quality metrics, and best practice recommendations. A written personalized care plan for preventive services as well as general preventive health recommendations were provided to patient.  See attached scanned questionnaire for additional information.   Signed,   Randa Evens, MHA, BS, LPN Health Advisor

## 2016-09-15 NOTE — Patient Instructions (Signed)
Ms. Stange , Thank you for taking time to come for your Medicare Wellness Visit. I appreciate your ongoing commitment to your health goals. Please review the following plan we discussed and let me know if I can assist you in the future.   These are the goals we discussed: Goals    . Increase physical activity          Starting 09/15/2016, I will continue to exercise for at least 60 min 3 days per week.        This is a list of the screening recommended for you and due dates:  Health Maintenance  Topic Date Due  . Eye exam for diabetics  12/11/2016*  . Hemoglobin A1C  12/06/2016  . Complete foot exam   06/06/2017  . Tetanus Vaccine  06/13/2017  . Flu Shot  Addressed  . DEXA scan (bone density measurement)  Completed  . Shingles Vaccine  Completed  . Pneumonia vaccines  Completed  *Topic was postponed. The date shown is not the original due date.   Preventive Care for Adults  A healthy lifestyle and preventive care can promote health and wellness. Preventive health guidelines for adults include the following key practices.  . A routine yearly physical is a good way to check with your health care Sher Hellinger about your health and preventive screening. It is a chance to share any concerns and updates on your health and to receive a thorough exam.  . Visit your dentist for a routine exam and preventive care every 6 months. Brush your teeth twice a day and floss once a day. Good oral hygiene prevents tooth decay and gum disease.  . The frequency of eye exams is based on your age, health, family medical history, use  of contact lenses, and other factors. Follow your health care Penina Reisner's ecommendations for frequency of eye exams.  . Eat a healthy diet. Foods like vegetables, fruits, whole grains, low-fat dairy products, and lean protein foods contain the nutrients you need without too many calories. Decrease your intake of foods high in solid fats, added sugars, and salt. Eat the right  amount of calories for you. Get information about a proper diet from your health care Acelynn Dejonge, if necessary.  . Regular physical exercise is one of the most important things you can do for your health. Most adults should get at least 150 minutes of moderate-intensity exercise (any activity that increases your heart rate and causes you to sweat) each week. In addition, most adults need muscle-strengthening exercises on 2 or more days a week.  Silver Sneakers may be a benefit available to you. To determine eligibility, you may visit the website: www.silversneakers.com or contact program at 515-740-7049 Mon-Fri between 8AM-8PM.   . Maintain a healthy weight. The body mass index (BMI) is a screening tool to identify possible weight problems. It provides an estimate of body fat based on height and weight. Your health care Meliss Fleek can find your BMI and can help you achieve or maintain a healthy weight.   For adults 20 years and older: ? A BMI below 18.5 is considered underweight. ? A BMI of 18.5 to 24.9 is normal. ? A BMI of 25 to 29.9 is considered overweight. ? A BMI of 30 and above is considered obese.   . Maintain normal blood lipids and cholesterol levels by exercising and minimizing your intake of saturated fat. Eat a balanced diet with plenty of fruit and vegetables. Blood tests for lipids and cholesterol should begin at  age 17 and be repeated every 5 years. If your lipid or cholesterol levels are high, you are over 50, or you are at high risk for heart disease, you may need your cholesterol levels checked more frequently. Ongoing high lipid and cholesterol levels should be treated with medicines if diet and exercise are not working.  . If you smoke, find out from your health care Aleira Deiter how to quit. If you do not use tobacco, please do not start.  . If you choose to drink alcohol, please do not consume more than 2 drinks per day. One drink is considered to be 12 ounces (355 mL) of beer, 5  ounces (148 mL) of wine, or 1.5 ounces (44 mL) of liquor.  . If you are 76-13 years old, ask your health care Abigael Mogle if you should take aspirin to prevent strokes.  . Use sunscreen. Apply sunscreen liberally and repeatedly throughout the day. You should seek shade when your shadow is shorter than you. Protect yourself by wearing long sleeves, pants, a wide-brimmed hat, and sunglasses year round, whenever you are outdoors.  . Once a month, do a whole body skin exam, using a mirror to look at the skin on your back. Tell your health care Tommie Bohlken of new moles, moles that have irregular borders, moles that are larger than a pencil eraser, or moles that have changed in shape or color.

## 2016-09-15 NOTE — Progress Notes (Signed)
Pre visit review using our clinic review tool, if applicable. No additional management support is needed unless otherwise documented below in the visit note. 

## 2016-09-19 ENCOUNTER — Encounter: Payer: Medicare Other | Admitting: Family Medicine

## 2016-09-22 ENCOUNTER — Encounter: Payer: Self-pay | Admitting: Family Medicine

## 2016-09-22 ENCOUNTER — Ambulatory Visit (INDEPENDENT_AMBULATORY_CARE_PROVIDER_SITE_OTHER): Payer: Medicare Other | Admitting: Family Medicine

## 2016-09-22 VITALS — BP 130/84 | HR 70 | Temp 98.1°F | Ht 61.0 in | Wt 173.0 lb

## 2016-09-22 DIAGNOSIS — M25569 Pain in unspecified knee: Secondary | ICD-10-CM | POA: Diagnosis not present

## 2016-09-22 DIAGNOSIS — E785 Hyperlipidemia, unspecified: Secondary | ICD-10-CM | POA: Diagnosis not present

## 2016-09-22 DIAGNOSIS — E119 Type 2 diabetes mellitus without complications: Secondary | ICD-10-CM | POA: Diagnosis not present

## 2016-09-22 DIAGNOSIS — I10 Essential (primary) hypertension: Secondary | ICD-10-CM

## 2016-09-22 MED ORDER — AMLODIPINE BESYLATE 5 MG PO TABS: ORAL_TABLET | ORAL | 3 refills | Status: DC

## 2016-09-22 MED ORDER — METFORMIN HCL 500 MG PO TABS
250.0000 mg | ORAL_TABLET | Freq: Two times a day (BID) | ORAL | 3 refills | Status: DC
Start: 2016-09-22 — End: 2017-12-16

## 2016-09-22 MED ORDER — ENALAPRIL MALEATE 20 MG PO TABS: ORAL_TABLET | ORAL | 3 refills | Status: DC

## 2016-09-22 NOTE — Patient Instructions (Signed)
Take care.  Glad to see you.  Update me as needed.   Thanks for your effort.   Recheck A1c in about 4 months before a visit.   Don't change your meds for now.

## 2016-09-22 NOTE — Progress Notes (Signed)
Diabetes:  Using medications without difficulties:yes Hypoglycemic episodes: no  Hyperglycemic episodes: no Feet problems: no  Blood Sugars averaging: 90-110 eye exam within last year: f/u pending.   A1c at goal.  D/w pt.   [facility]   Hypertension:    Using medication without problems or lightheadedness: yes Chest pain with exertion:no Edema:no Short of breath:no  B knee pain and back pain improved with current meds, tramadol and tylenol w/o ADE.    PMH and SH reviewed.   Vital signs, Meds and allergies reviewed.  ROS: Per HPI unless specifically indicated in ROS section   GEN: nad, alert and oriented HEENT: mucous membranes moist NECK: supple w/o LA CV: rrr. PULM: ctab, no inc wob ABD: soft, +bs EXT: no edema SKIN: no acute rash but chronic skin changes noted.   Diabetic foot exam: Normal inspection No skin breakdown No calluses  Normal DP pulses Normal sensation to light tough and monofilament Nails normal

## 2016-09-22 NOTE — Progress Notes (Signed)
Pre visit review using our clinic review tool, if applicable. No additional management support is needed unless otherwise documented below in the visit note. 

## 2016-09-23 NOTE — Assessment & Plan Note (Addendum)
Statin intolerant.  Continue diet and exercise.

## 2016-09-23 NOTE — Assessment & Plan Note (Signed)
Continue current meds.  No ADE on med.  Some relief of pain with meds. Continue exercise at tolerated.

## 2016-09-23 NOTE — Assessment & Plan Note (Addendum)
Continue work on diet and exercise, no change in meds. Doing well, A1c controlled.  Labs d/w pt. Recheck in about 4 months.  She agrees.   >25 minutes spent in face to face time with patient, >50% spent in counselling or coordination of care.

## 2016-09-23 NOTE — Assessment & Plan Note (Signed)
Continue diet and exercise.  Controlled.  No change in meds.

## 2016-09-27 DIAGNOSIS — E119 Type 2 diabetes mellitus without complications: Secondary | ICD-10-CM | POA: Diagnosis not present

## 2016-09-27 LAB — HM DIABETES EYE EXAM

## 2016-10-03 ENCOUNTER — Encounter: Payer: Self-pay | Admitting: Family Medicine

## 2017-01-16 ENCOUNTER — Other Ambulatory Visit: Payer: Self-pay | Admitting: Family Medicine

## 2017-01-16 DIAGNOSIS — E119 Type 2 diabetes mellitus without complications: Secondary | ICD-10-CM

## 2017-01-24 ENCOUNTER — Other Ambulatory Visit (INDEPENDENT_AMBULATORY_CARE_PROVIDER_SITE_OTHER): Payer: Medicare Other

## 2017-01-24 DIAGNOSIS — E119 Type 2 diabetes mellitus without complications: Secondary | ICD-10-CM | POA: Diagnosis not present

## 2017-01-24 LAB — HEMOGLOBIN A1C: HEMOGLOBIN A1C: 6.5 % (ref 4.6–6.5)

## 2017-01-26 ENCOUNTER — Ambulatory Visit: Payer: Medicare Other | Admitting: Family Medicine

## 2017-01-31 ENCOUNTER — Ambulatory Visit (INDEPENDENT_AMBULATORY_CARE_PROVIDER_SITE_OTHER): Payer: Medicare Other | Admitting: Family Medicine

## 2017-01-31 ENCOUNTER — Encounter: Payer: Self-pay | Admitting: Family Medicine

## 2017-01-31 DIAGNOSIS — E119 Type 2 diabetes mellitus without complications: Secondary | ICD-10-CM | POA: Diagnosis not present

## 2017-01-31 MED ORDER — TRAMADOL HCL 50 MG PO TABS: 50.0000 mg | ORAL_TABLET | Freq: Two times a day (BID) | ORAL | 1 refills | Status: DC | PRN

## 2017-01-31 NOTE — Progress Notes (Signed)
Diabetes:  Using medications without difficulties:yes Hypoglycemic episodes:no Hyperglycemic episodes:no Feet problems:no Blood Sugars averaging: ~100-120 eye exam within last year: yes A1c d/w pt. A1c up slightly but still controlled.  She is still exercising.    Still on tramadol and tylenol for pain w/o ADE.    PMH and SH reviewed  Meds, vitals, and allergies reviewed.   ROS: Per HPI unless specifically indicated in ROS section   GEN: nad, alert and oriented HEENT: mucous membranes moist NECK: supple w/o LA CV: rrr. PULM: ctab, no inc wob ABD: soft, +bs EXT: no edema Skin with chronic changes noted.

## 2017-01-31 NOTE — Patient Instructions (Signed)
Don't change your meds.   Keep working on diet and exercise.  Recheck in about 6 months with labs before a physical  Take care.  Glad to see you.

## 2017-01-31 NOTE — Progress Notes (Signed)
Pre visit review using our clinic review tool, if applicable. No additional management support is needed unless otherwise documented below in the visit note. 

## 2017-02-01 NOTE — Assessment & Plan Note (Signed)
Sugar is reasonable on home checks. A1c slightly up but still controlled. Discussed with patient. No change in medication. Continue work on diet and exercise. She agrees.

## 2017-02-09 ENCOUNTER — Other Ambulatory Visit: Payer: Self-pay | Admitting: Family Medicine

## 2017-02-09 DIAGNOSIS — Z1231 Encounter for screening mammogram for malignant neoplasm of breast: Secondary | ICD-10-CM

## 2017-04-05 DIAGNOSIS — M7541 Impingement syndrome of right shoulder: Secondary | ICD-10-CM | POA: Diagnosis not present

## 2017-05-30 ENCOUNTER — Encounter: Payer: Self-pay | Admitting: Family Medicine

## 2017-05-30 ENCOUNTER — Ambulatory Visit (INDEPENDENT_AMBULATORY_CARE_PROVIDER_SITE_OTHER): Payer: Medicare Other | Admitting: Family Medicine

## 2017-05-30 ENCOUNTER — Ambulatory Visit
Admission: RE | Admit: 2017-05-30 | Discharge: 2017-05-30 | Disposition: A | Discharge status: Home / Self Care | Payer: Medicare Other | Source: Ambulatory Visit | Attending: Family Medicine | Admitting: Family Medicine

## 2017-05-30 VITALS — BP 144/84 | HR 48 | Temp 99.4°F | Wt 175.8 lb

## 2017-05-30 DIAGNOSIS — E119 Type 2 diabetes mellitus without complications: Secondary | ICD-10-CM | POA: Diagnosis not present

## 2017-05-30 DIAGNOSIS — Z1231 Encounter for screening mammogram for malignant neoplasm of breast: Secondary | ICD-10-CM

## 2017-05-30 DIAGNOSIS — I499 Cardiac arrhythmia, unspecified: Secondary | ICD-10-CM | POA: Diagnosis not present

## 2017-05-30 NOTE — Progress Notes (Signed)
Initially with pulse 48 on manual check by [place].  Recheck 60s but irregular.  No CP, SOB, BLE edema.  Has been exercising in the pool at baseline.    She has been taking ibuprofen instead of tylenol and that seemed to help her pain about as much with tylenol did. Still taking tramadol.   She mainly came into discussed that today but the results irregularity was noted incidentally.  Meds, vitals, and allergies reviewed.   ROS: Per HPI unless specifically indicated in ROS section   GEN: nad, alert and oriented HEENT: mucous membranes moist NECK: supple w/o LA CV: ectopy noted, not tachy PULM: ctab, no inc wob ABD: soft, +bs EXT: no edema SKIN: no acute rash but chronic changes noted.

## 2017-05-30 NOTE — Patient Instructions (Addendum)
Go to the lab on the way out.  We'll contact you with your lab report. Shirlee Limerick will call about your referral. If you have chest pain, shortness of breath, or feel lightheaded then go to the ER.  No exercise for now.  Take care.  Glad to see you.

## 2017-05-31 ENCOUNTER — Other Ambulatory Visit: Payer: Medicare Other

## 2017-05-31 DIAGNOSIS — I499 Cardiac arrhythmia, unspecified: Secondary | ICD-10-CM | POA: Insufficient documentation

## 2017-05-31 LAB — CBC WITH DIFFERENTIAL/PLATELET
Basophils Absolute: 0 10*3/uL (ref 0.0–0.1)
Basophils Relative: 0.7 % (ref 0.0–3.0)
EOS PCT: 4 % (ref 0.0–5.0)
Eosinophils Absolute: 0.2 10*3/uL (ref 0.0–0.7)
HCT: 43.6 % (ref 36.0–46.0)
HEMOGLOBIN: 14.7 g/dL (ref 12.0–15.0)
Lymphocytes Relative: 40.8 % (ref 12.0–46.0)
Lymphs Abs: 2 10*3/uL (ref 0.7–4.0)
MCHC: 33.6 g/dL (ref 30.0–36.0)
MCV: 89.6 fl (ref 78.0–100.0)
MONO ABS: 0.5 10*3/uL (ref 0.1–1.0)
MONOS PCT: 10.5 % (ref 3.0–12.0)
Neutro Abs: 2.2 10*3/uL (ref 1.4–7.7)
Neutrophils Relative %: 44 % (ref 43.0–77.0)
Platelets: 399 10*3/uL (ref 150.0–400.0)
RBC: 4.87 Mil/uL (ref 3.87–5.11)
RDW: 14.1 % (ref 11.5–15.5)
WBC: 4.9 10*3/uL (ref 4.0–10.5)

## 2017-05-31 LAB — COMPREHENSIVE METABOLIC PANEL
ALBUMIN: 4.1 g/dL (ref 3.5–5.2)
ALK PHOS: 55 U/L (ref 39–117)
ALT: 10 U/L (ref 0–35)
AST: 16 U/L (ref 0–37)
BUN: 12 mg/dL (ref 6–23)
CALCIUM: 9.9 mg/dL (ref 8.4–10.5)
CO2: 28 mEq/L (ref 19–32)
Chloride: 102 mEq/L (ref 96–112)
Creatinine, Ser: 0.78 mg/dL (ref 0.40–1.20)
GFR: 92.16 mL/min (ref >60.00)
Glucose, Bld: 94 mg/dL (ref 70–99)
POTASSIUM: 4.4 meq/L (ref 3.5–5.1)
Sodium: 138 mEq/L (ref 135–145)
TOTAL PROTEIN: 7 g/dL (ref 6.0–8.3)
Total Bilirubin: 0.5 mg/dL (ref 0.2–1.2)

## 2017-05-31 LAB — HEMOGLOBIN A1C: Hgb A1c MFr Bld: 6.3 % (ref 4.6–6.5)

## 2017-05-31 LAB — TSH: TSH: 0.74 u[IU]/mL (ref 0.35–4.50)

## 2017-05-31 NOTE — Assessment & Plan Note (Signed)
She has PVCs noted. Discussed with patient. Unclear source. Check routine labs today. No change in meds. I did not want to start a beta blocker given that she has a lower pulse occasionally noted, she is not tachycardic, she is not symptomatic. Routine emergency room cautions given. Refer to cardiology. Broad differential discussed with patient. At this point still okay for outpatient follow-up. She agrees with plan. >25 minutes spent in face to face time with patient, >50% spent in counselling or coordination of care.

## 2017-06-01 ENCOUNTER — Telehealth: Payer: Self-pay

## 2017-06-01 ENCOUNTER — Other Ambulatory Visit: Payer: Medicare Other

## 2017-06-01 ENCOUNTER — Ambulatory Visit: Payer: Medicare Other | Admitting: Family Medicine

## 2017-06-01 NOTE — Telephone Encounter (Signed)
Patient advised and feels better about her situation.

## 2017-06-01 NOTE — Telephone Encounter (Signed)
Pt seen 05/30/17; pt was in shock when told something wrong with her heart; now that she is over the initial shock pt cannot remember what Dr Para March told her at the 05/30/17 visit about what could cause the problem and pt just drawn a blank about that appt. Pt is scared and anxious and would feel better to talk to Dr Para March so he can reassure pt; pt has appt with cardiology PA on 06/09/17. Pt is still asymptomatic. Pt is aware Dr Para March is seeing pts now.

## 2017-06-01 NOTE — Telephone Encounter (Signed)
See lab report. She had extra beats noted on the exam.  People occ come in with this on exam.  This isn't a heart problem like a heart attack.  She has an abnormal rhythm that isn't an emergent but may need extra monitoring and potentially extra medicines to treat.  She has extra PVCs but she wasn't in A fib.  Needs cards input as scheduled.  Let me know how she does with that explanation.  Thanks.

## 2017-06-09 ENCOUNTER — Encounter: Payer: Self-pay | Admitting: Physician Assistant

## 2017-06-09 ENCOUNTER — Ambulatory Visit (INDEPENDENT_AMBULATORY_CARE_PROVIDER_SITE_OTHER): Payer: Medicare Other | Admitting: Physician Assistant

## 2017-06-09 VITALS — BP 144/70 | HR 83 | Ht 62.0 in | Wt 175.0 lb

## 2017-06-09 DIAGNOSIS — I498 Other specified cardiac arrhythmias: Secondary | ICD-10-CM

## 2017-06-09 DIAGNOSIS — I10 Essential (primary) hypertension: Secondary | ICD-10-CM

## 2017-06-09 DIAGNOSIS — E785 Hyperlipidemia, unspecified: Secondary | ICD-10-CM

## 2017-06-09 DIAGNOSIS — E119 Type 2 diabetes mellitus without complications: Secondary | ICD-10-CM | POA: Diagnosis not present

## 2017-06-09 DIAGNOSIS — I499 Cardiac arrhythmia, unspecified: Secondary | ICD-10-CM | POA: Diagnosis not present

## 2017-06-09 DIAGNOSIS — I493 Ventricular premature depolarization: Secondary | ICD-10-CM | POA: Diagnosis not present

## 2017-06-09 NOTE — Patient Instructions (Signed)
Medication Instructions:   No changes  Labwork:   none  Testing/Procedures:  Your physician has requested that you have an echocardiogram. Echocardiography is a painless test that uses sound waves to create images of your heart. It provides your doctor with information about the size and shape of your heart and how well your heart's chambers and valves are working. This procedure takes approximately one hour. There are no restrictions for this procedure.  Your physician has recommended that you wear a holter monitor. Holter monitors are medical devices that record the heart's electrical activity. Doctors most often use these monitors to diagnose arrhythmias. Arrhythmias are problems with the speed or rhythm of the heartbeat. The monitor is a small, portable device. You can wear one while you do your normal daily activities. This is usually used to diagnose what is causing palpitations/syncope (passing out).   Follow-Up:  6 months with Dr. Herbie Baltimore   If you need a refill on your cardiac medications before your next appointment, please call your pharmacy.

## 2017-06-09 NOTE — Progress Notes (Signed)
Cardiology Office Note    Date:  06/10/2017   ID:  Natasha, Woodard [birth year], MRN [id number]  PCP:  [name], MD  Cardiologist:  Plan to set up with DOD Dr. Herbie Baltimore.   Chief Complaint  Patient presents with  . Follow-up    New patient. Pulse regularly    History of Present Illness:  Natasha Woodard is a 76 y.o. female with PMH of DM II, HLD intolerant to statins, HTN, and obesity. On the most recent visit, it was noticed her heart rate was in the high 40s. EKG showed PVCs. She was referred to cardiology service for further evaluation. She denies any chest pain or shortness of breath. She denies any low extremity edema, orthopnea or PND. On physical exam, she does have occasional skipped heartbeat. EKG showed she has both PVCs and PACs. The PVCs are generally benign and may not need treatment unless become more frequent. I have discussed with DOD Dr. Herbie Baltimore, we will plan to obtain echocardiogram and also 24-hour Holter monitor to assess the frequency of PVCs. If there are frequent PVCs (>10000 in 24 hours), we may consider beta blocker for suppression of PVCs and PVC. I think the recent bradycardia that was noticed in the clinic is likely related to the PVCs as majority of manual check and electronic machines may not able to pickup PVCs and does not count them toward heart rate, therefore making the heart rate falsely low. The 24 hour Holter monitor will also lead is known if there are significant bradycardia. I have also reviewed the previous CT of chest, she does have some mild coronary calcifications, however with lack of chest discomfort or dyspnea on exertion, there is currently no indication for his ischemic workup.    Past Medical History:  Diagnosis Date  . Calcific tendonitis 2008   Treatment of left leg  . CTS (carpal tunnel syndrome)    worse on right hand/ wears wrist splint  . Diabetes mellitus    Type II  . Hiatal hernia    with reflux  . Hyperlipidemia     Statin intolerant  . Hypertension   . Ichthyosis congenita   . NSVD (normal spontaneous vaginal delivery) 1971 & 1972  . Obesity   . Plantar fasciitis    Left  . Pulmonary nodule    Imaged multiple times and benign appearing  . Wears glasses     Past Surgical History:  Procedure Laterality Date  . ABDOMINAL HYSTERECTOMY  1975   TAH-BSO  . ABSCESS DRAINAGE  1972   right breast  . BREAST BIOPSY Left 01/14/2009   Stereo- Benign  . CARPAL TUNNEL RELEASE Right 07/02/2014   Procedure: RIGHT CARPAL TUNNEL RELEASE;  Surgeon: Nicki Reaper, MD;  Location: [facility];  Service: Orthopedics;  Laterality: Right;  . CARPAL TUNNEL RELEASE Left 06/11/2015   Procedure: LEFT CARPAL TUNNEL RELEASE;  Surgeon: Cindee Salt, MD;  Location: [facility];  Service: Orthopedics;  Laterality: Left;  REGIONAL/FAB  . COLONOSCOPY    . corn removal  1968   right  . ROTATOR CUFF REPAIR     left shoulder    Current Medications: Outpatient Medications Prior to Visit  Medication Sig Dispense Refill  . amLODipine (NORVASC) 5 MG tablet TAKE ONE TABLET BY MOUTH EVERY DAY 90 tablet 3  . aspirin 81 MG tablet Take 81 mg by mouth daily.      . Cholecalciferol (VITAMIN D) 1000 UNITS capsule Take  1,000 Units by mouth daily.     . enalapril (VASOTEC) 20 MG tablet TAKE ONE TABLET BY MOUTH ONCE DAILY 90 tablet 3  . glucose blood (ACCU-CHEK AVIVA PLUS) test strip USE TO TEST BLOOD SUGAR ONCE DAILY FOR DM 250.00 100 each 3  . Lancets (ACCU-CHEK MULTICLIX) lancets USE ONE LANCET TO TEST BLOOD GLUCOSE DAILY FOR DM 250.00 102 each 3  . metFORMIN (GLUCOPHAGE) 500 MG tablet Take 0.5 tablets (250 mg total) by mouth 2 (two) times daily with a meal. 90 tablet 3  . traMADol (ULTRAM) 50 MG tablet Take 1 tablet (50 mg total) by mouth every 12 (twelve) hours as needed. (Patient taking differently: Take 50 mg by mouth 2 (two) times daily. ) 180 tablet 1  . ibuprofen (ADVIL,MOTRIN) 200 MG tablet Take 200 mg  by mouth 2 (two) times daily.     No facility-administered medications prior to visit.      Allergies:   Ezetimibe-simvastatin; Pravastatin sodium; and Sulfonamide derivatives   Social History   Social History  . Marital status: Married    Spouse name: N/A  . Number of children: 2  . Years of education: N/A   Occupational History  .  Retired   Social History Main Topics  . Smoking status: Former Smoker    Years: 28.00    Types: Cigarettes    Quit date: 12/12/1993  . Smokeless tobacco: Never Used  . Alcohol use No  . Drug use: No  . Sexual activity: No   Other Topics Concern  . None   Social History Narrative   Two kids, two grandchildren.    Married 1969   Retired.      Family History:  The patient's family history includes Cancer in her brother; Diabetes in her father and mother; Heart disease in her brother, father, and mother.   ROS:   Please see the history of present illness.    ROS All other systems reviewed and are negative.   PHYSICAL EXAM:   VS:  BP (!) 144/70   Pulse 83   Ht 5\' 2"  (1.575 m)   Wt 175 lb (79.4 kg)   BMI 32.01 kg/m    GEN: Well nourished, well developed, in no acute distress  HEENT: normal  Neck: no JVD, carotid bruits, or masses Cardiac: RRR; no murmurs, rubs, or gallops,no edema  Respiratory:  clear to auscultation bilaterally, normal work of breathing GI: soft, nontender, nondistended, + BS MS: no deformity or atrophy  Skin: warm and dry, no rash Neuro:  Alert and Oriented x 3, Strength and sensation are intact Psych: euthymic mood, full affect  Wt Readings from Last 3 Encounters:  06/09/17 175 lb (79.4 kg)  05/30/17 175 lb 12 oz (79.7 kg)  01/31/17 180 lb 12 oz (82 kg)      Studies/Labs Reviewed:   EKG:  EKG is ordered today.  The ekg ordered today demonstrates Sinus rhythm with PACs and PVCs.  Recent Labs: 05/30/2017: ALT 10; BUN 12; Creatinine, Ser 0.78; Hemoglobin 14.7; Platelets 399.0; Potassium 4.4; Sodium 138;  TSH 0.74   Lipid Panel    Component Value Date/Time   CHOL 183 09/15/2016 1253   TRIG 129.0 09/15/2016 1253   HDL 39.30 09/15/2016 1253   CHOLHDL 5 09/15/2016 1253   VLDL 25.8 09/15/2016 1253   LDLCALC 118 (H) 09/15/2016 1253   LDLDIRECT 53 08/10/2009    Additional studies/ records that were reviewed today include:   Office visit from primary care physician  ASSESSMENT:    1. Frequent PVCs   2. Bigeminy   3. Hyperlipidemia, unspecified hyperlipidemia type   4. Essential hypertension   5. Controlled type 2 diabetes mellitus without complication, without long-term current use of insulin (HCC)      PLAN:  In order of problems listed above:  1. Frequent PVCs: Will obtain echocardiogram and 24-hour Holter monitor. Recent bradycardia likely due to failure to pick up PVCs on either manual or electronic check and count them toward heart rate.  2. HTN: Blood pressure mildly elevated today. May consider adding beta blocker if does have frequent PVCs for suppression. We'll rule out bradycardia on the 24-hour Holter monitor.  3. HLD: Intolerant to statins  4. DM II: On metformin.    Medication Adjustments/Labs and Tests Ordered: Current medicines are reviewed at length with the patient today.  Concerns regarding medicines are outlined above.  Medication changes, Labs and Tests ordered today are listed in the Patient Instructions below. Patient Instructions  Medication Instructions:   No changes  Labwork:   none  Testing/Procedures:  Your physician has requested that you have an echocardiogram. Echocardiography is a painless test that uses sound waves to create images of your heart. It provides your doctor with information about the size and shape of your heart and how well your heart's chambers and valves are working. This procedure takes approximately one hour. There are no restrictions for this procedure.  Your physician has recommended that you wear a holter monitor.  Holter monitors are medical devices that record the heart's electrical activity. Doctors most often use these monitors to diagnose arrhythmias. Arrhythmias are problems with the speed or rhythm of the heartbeat. The monitor is a small, portable device. You can wear one while you do your normal daily activities. This is usually used to diagnose what is causing palpitations/syncope (passing out).   Follow-Up:  6 months with Dr. Herbie Baltimore   If you need a refill on your cardiac medications before your next appointment, please call your pharmacy.      Ramond Dial, [place]  06/10/2017 1:19 PM    [facility] [address] [place], [place], [place]  [address] Phone: [phone]; Fax: [phone]

## 2017-06-10 ENCOUNTER — Encounter: Payer: Self-pay | Admitting: Physician Assistant

## 2017-06-11 DIAGNOSIS — I429 Cardiomyopathy, unspecified: Secondary | ICD-10-CM

## 2017-06-11 HISTORY — PX: OTHER SURGICAL HISTORY: SHX169

## 2017-06-11 HISTORY — PX: TRANSTHORACIC ECHOCARDIOGRAM: SHX275

## 2017-06-11 HISTORY — DX: Cardiomyopathy, unspecified: I42.9

## 2017-06-22 ENCOUNTER — Other Ambulatory Visit: Payer: Self-pay

## 2017-06-22 ENCOUNTER — Ambulatory Visit (HOSPITAL_COMMUNITY): Payer: Medicare Other | Attending: Cardiology

## 2017-06-22 ENCOUNTER — Ambulatory Visit (INDEPENDENT_AMBULATORY_CARE_PROVIDER_SITE_OTHER): Payer: Medicare Other

## 2017-06-22 DIAGNOSIS — I493 Ventricular premature depolarization: Secondary | ICD-10-CM | POA: Diagnosis not present

## 2017-06-22 DIAGNOSIS — I498 Other specified cardiac arrhythmias: Secondary | ICD-10-CM

## 2017-06-22 DIAGNOSIS — I499 Cardiac arrhythmia, unspecified: Secondary | ICD-10-CM

## 2017-06-22 DIAGNOSIS — I340 Nonrheumatic mitral (valve) insufficiency: Secondary | ICD-10-CM | POA: Diagnosis not present

## 2017-06-23 ENCOUNTER — Telehealth: Payer: Self-pay | Admitting: Cardiology

## 2017-06-23 ENCOUNTER — Telehealth: Payer: Self-pay | Admitting: *Deleted

## 2017-06-23 NOTE — Telephone Encounter (Signed)
Patients holter monitor fell off during the night because of her skin condition.  She reapplied it and wanted to know what to do.  Told patient to remove monitor a 2:30PM and return to our office Monday.  If too much data has been lost we will repeat the test.

## 2017-06-23 NOTE — Telephone Encounter (Signed)
Natasha Woodard is calling because she has some questions about the monitor that was placed on her on yesterday . Stating that the leads have come off .  Please call   Thanks

## 2017-06-23 NOTE — Telephone Encounter (Signed)
Forward to [address] - monitoring area

## 2017-06-26 NOTE — Telephone Encounter (Signed)
Discussed echo results w patient. She has requested f/u call from [place], I have sent msg to him.

## 2017-06-26 NOTE — Telephone Encounter (Signed)
-----   Message from [place], [place] sent at 06/22/2017  8:26 PM EDT ----- Patient recently referred to cardiology service for bradycardia. Case discussed and planned to set up with Dr. Herbie Baltimore. Noted to have PVCs, pending 24 hour holter monitor to assess PVC burdens. Echo is severely abnormal with reduced pumping function of heart. She denied any chest pain or DOE in the office, recommend Lexiscan myoview to further differentiate between ischemic vs nonischemic cardiomyopathy.

## 2017-06-27 ENCOUNTER — Encounter: Payer: Self-pay | Admitting: Physician Assistant

## 2017-06-27 ENCOUNTER — Telehealth: Payer: Self-pay | Admitting: Physician Assistant

## 2017-06-27 ENCOUNTER — Other Ambulatory Visit: Payer: Self-pay | Admitting: Physician Assistant

## 2017-06-27 ENCOUNTER — Ambulatory Visit (INDEPENDENT_AMBULATORY_CARE_PROVIDER_SITE_OTHER): Payer: Medicare Other | Admitting: Physician Assistant

## 2017-06-27 VITALS — BP 160/84 | HR 82 | Ht 62.0 in | Wt 176.4 lb

## 2017-06-27 DIAGNOSIS — I420 Dilated cardiomyopathy: Secondary | ICD-10-CM

## 2017-06-27 DIAGNOSIS — I10 Essential (primary) hypertension: Secondary | ICD-10-CM | POA: Diagnosis not present

## 2017-06-27 DIAGNOSIS — I493 Ventricular premature depolarization: Secondary | ICD-10-CM

## 2017-06-27 DIAGNOSIS — E119 Type 2 diabetes mellitus without complications: Secondary | ICD-10-CM | POA: Diagnosis not present

## 2017-06-27 DIAGNOSIS — E785 Hyperlipidemia, unspecified: Secondary | ICD-10-CM

## 2017-06-27 DIAGNOSIS — Z01812 Encounter for preprocedural laboratory examination: Secondary | ICD-10-CM

## 2017-06-27 MED ORDER — CARVEDILOL 3.125 MG PO TABS: 3.1250 mg | ORAL_TABLET | Freq: Two times a day (BID) | ORAL | 3 refills | Status: DC

## 2017-06-27 NOTE — Telephone Encounter (Signed)
I have sent her via mychart further instructions modified from template to explain the left and right heart cath. Additional 30 min spend for patient care instruction. Thank you

## 2017-06-27 NOTE — Telephone Encounter (Signed)
Spoke with pt she states that she is overwhelmed with her appt today she would like an email explaning cath procedure that is scheduled friday

## 2017-06-27 NOTE — Patient Instructions (Addendum)
Medication Instructions:  Start carvedilol (coreg) 3.125 two times daily  Labwork: Pre-procedure labs TODAY (CBC, BMET, PT/INR)  Testing/Procedures: Cardiac Cath with Dr. Herbie Baltimore Friday 06/30/17 (see information sheet)  Follow-Up: Your physician recommends that you schedule a follow-up appointment in: 2 weeks with Natasha Course PA and 2-3 Months with Dr. Herbie Baltimore.    Any Other Special Instructions Will Be Listed Below (If Applicable).     If you need a refill on your cardiac medications before your next appointment, please call your pharmacy.    [facility] CARDIOVASCULAR DIVISION [facility] [address] [place] [address] Dept: [phone] Loc: [phone]  Natasha Woodard  06/27/2017  You are scheduled for a Cardiac Catheterization on Friday, July 20 with Dr. Bryan Lemma.  1. Please arrive at the [facility] (Main Entrance A) at [facility]: [address] [place], [place] [address] at 11:30 AM (two hours before your procedure to ensure your preparation). Free valet parking service is available.   Special note: Every effort is made to have your procedure done on time. Please understand that emergencies sometimes delay scheduled procedures.  2. Diet: Do not eat or drink anything after midnight prior to your procedure except sips of water to take medications.  3. Labs: You will need to have blood drawn on Tuesday, July 17 at [facility] [address], [place]  Open: 8am - 5pm (Lunch 12:30 - 1:30)   Phone: [phone]. You do not need to be fasting.  4. Medication instructions in preparation for your procedure:  Stop taking, Glucophage (Metformin) on Thursday, July 19.  (24 hours prior & will need to hold 48 hours after)  On the morning of your procedure, take your Aspirin and any morning medicines NOT listed above.  You may use sips of water.  5. Plan for one night stay--bring personal  belongings. 6. Bring a current list of your medications and current insurance cards. 7. You MUST have a responsible person to drive you home. 8. Someone MUST be with you the first 24 hours after you arrive home or your discharge will be delayed. 9. Please wear clothes that are easy to get on and off and wear slip-on shoes.  Thank you for allowing [place] to care for you!   -- [facility]

## 2017-06-27 NOTE — Progress Notes (Signed)
Cardiology Office Note    Date:  06/27/2017   ID:  Donne, Robillard [birth year], MRN [id number]  PCP:  [name], MD  Cardiologist:  Dr. Herbie Baltimore  Chief Complaint  Patient presents with  . Follow-up    seen with Dr. Herbie Baltimore    History of Present Illness:  Natasha Woodard is a 76 y.o. female with PMH of DM II, HLD intolerant to statins, HTN, and obesity. On the most recent visit, it was noticed her heart rate was in the high 40s. EKG showed PVCs. She was referred to cardiology service for further evaluation. She denies any chest pain or shortness of breath. She denies any low extremity edema, orthopnea or PND. On physical exam, she does have occasional skipped heartbeat. EKG showed she has both PVCs and PACs. The PVCs are generally benign and may not need treatment unless become more frequent.   I last saw the patient on 06/09/2017, after discussing with DOD Dr. Herbie Baltimore, I recommended echocardiogram and 24-hour Holter monitor to assess for the PVC burden. Echocardiogram however came back severely abnormal showing EF 25-30%. I initially recommended outpatient Myoview to assess for ischemic versus nonischemic etiology, however due to a miscommunication, patient was under the belief that she has some suffered a heart attack in the past. I called that the patient is morning, she was very upset and says she did not get any sleep last night because of our nurse told her she has suffered a heart attack in the past. I did apologize for the fact and told her that there is no current evidence that she has suffered a heart attack in the past, although having blockage is a small possibility. There are ischemic and nonischemic etiologies behind LV dysfunction including but not limited to history of coronary artery disease/MI, chronically uncontrolled high blood pressure, stress induced cardiomyopathy, chronic alcohol abuse, atrial fibrillation with RVR, chemotherapy, post viral etiology and  uncontrolled hyperthyroidism. We don't know at this point what caused her LV dysfunction. I did bring Dr. Herbie Baltimore along with me to discuss the case with the patient and her husband to clarify some of the confusion. According to the patient, she is just first started noticing fatigue and shortness of breath with exertion since January. However she has not gone back to exercise since last year, she just returned to exercise a month ago and it really started noticing dyspnea on exertion and worsening fatigue. It is unclear to [place] with the duration of her LV dysfunction, however it is possible it has been going on for at least 6 months, and that was previously undiagnosed until now. After discussing various options including stress test and definitive examination, she opted for left and right heart cath for definitive evaluation to rule out DOE as angina equivalent.   Dr. Herbie Baltimore spent > 1 hour talking with the patient and her husband.   Past Medical History:  Diagnosis Date  . Calcific tendonitis 2008   Treatment of left leg  . CTS (carpal tunnel syndrome)    worse on right hand/ wears wrist splint  . Diabetes mellitus    Type II  . Hiatal hernia    with reflux  . Hyperlipidemia    Statin intolerant  . Hypertension   . Ichthyosis congenita   . NSVD (normal spontaneous vaginal delivery) 1971 & 1972  . Obesity   . Plantar fasciitis    Left  . Pulmonary nodule    Imaged multiple times and benign appearing  .  Wears glasses     Past Surgical History:  Procedure Laterality Date  . ABDOMINAL HYSTERECTOMY  1975   TAH-BSO  . ABSCESS DRAINAGE  1972   right breast  . BREAST BIOPSY Left 01/14/2009   Stereo- Benign  . CARPAL TUNNEL RELEASE Right 07/02/2014   Procedure: RIGHT CARPAL TUNNEL RELEASE;  Surgeon: Nicki Reaper, MD;  Location: [facility];  Service: Orthopedics;  Laterality: Right;  . CARPAL TUNNEL RELEASE Left 06/11/2015   Procedure: LEFT CARPAL TUNNEL RELEASE;  Surgeon:  Cindee Salt, MD;  Location: [facility];  Service: Orthopedics;  Laterality: Left;  REGIONAL/FAB  . COLONOSCOPY    . corn removal  1968   right  . ROTATOR CUFF REPAIR     left shoulder    Current Medications: Outpatient Medications Prior to Visit  Medication Sig Dispense Refill  . amLODipine (NORVASC) 5 MG tablet TAKE ONE TABLET BY MOUTH EVERY DAY 90 tablet 3  . aspirin 81 MG tablet Take 81 mg by mouth daily.      . Cholecalciferol (VITAMIN D) 1000 UNITS capsule Take 1,000 Units by mouth daily.     . enalapril (VASOTEC) 20 MG tablet TAKE ONE TABLET BY MOUTH ONCE DAILY 90 tablet 3  . glucose blood (ACCU-CHEK AVIVA PLUS) test strip USE TO TEST BLOOD SUGAR ONCE DAILY FOR DM 250.00 100 each 3  . Ibuprofen (ADVIL) 200 MG CAPS Take 1 capsule by mouth 2 (two) times daily.    . Lancets (ACCU-CHEK MULTICLIX) lancets USE ONE LANCET TO TEST BLOOD GLUCOSE DAILY FOR DM 250.00 102 each 3  . metFORMIN (GLUCOPHAGE) 500 MG tablet Take 0.5 tablets (250 mg total) by mouth 2 (two) times daily with a meal. 90 tablet 3  . traMADol (ULTRAM) 50 MG tablet Take 1 tablet (50 mg total) by mouth every 12 (twelve) hours as needed. (Patient taking differently: Take 50 mg by mouth 2 (two) times daily. ) 180 tablet 1   No facility-administered medications prior to visit.      Allergies:   Ezetimibe-simvastatin; Pravastatin sodium; and Sulfonamide derivatives   Social History   Social History  . Marital status: Married    Spouse name: N/A  . Number of children: 2  . Years of education: N/A   Occupational History  .  Retired   Social History Main Topics  . Smoking status: Former Smoker    Years: 28.00    Types: Cigarettes    Quit date: 12/12/1993  . Smokeless tobacco: Never Used  . Alcohol use No  . Drug use: No  . Sexual activity: No   Other Topics Concern  . None   Social History Narrative   Two kids, two grandchildren.    Married 1969   Retired.      Family History:  The patient's  family history includes Cancer in her brother; Diabetes in her father and mother; Heart disease in her brother, father, and mother.   ROS:   Please see the history of present illness.    ROS All other systems reviewed and are negative.   PHYSICAL EXAM:   VS:  BP (!) 160/84 (BP Location: Right Arm, Cuff Size: Normal)   Pulse 82   Ht 5\' 2"  (1.575 m)   Wt 176 lb 6.4 oz (80 kg)   BMI 32.26 kg/m    GEN: Well nourished, well developed, in no acute distress  HEENT: normal  Neck: no JVD, carotid bruits, or masses Cardiac: RRR; no murmurs, rubs,  or gallops,no edema  Respiratory:  clear to auscultation bilaterally, normal work of breathing GI: soft, nontender, nondistended, + BS MS: no deformity or atrophy  Skin: warm and dry, no rash Neuro:  Alert and Oriented x 3, Strength and sensation are intact Psych: euthymic mood, full affect  Wt Readings from Last 3 Encounters:  06/27/17 176 lb 6.4 oz (80 kg)  06/09/17 175 lb (79.4 kg)  05/30/17 175 lb 12 oz (79.7 kg)      Studies/Labs Reviewed:   EKG:  EKG is not ordered today.    Recent Labs: 05/30/2017: ALT 10; BUN 12; Creatinine, Ser 0.78; Hemoglobin 14.7; Platelets 399.0; Potassium 4.4; Sodium 138; TSH 0.74   Lipid Panel    Component Value Date/Time   CHOL 183 09/15/2016 1253   TRIG 129.0 09/15/2016 1253   HDL 39.30 09/15/2016 1253   CHOLHDL 5 09/15/2016 1253   VLDL 25.8 09/15/2016 1253   LDLCALC 118 (H) 09/15/2016 1253   LDLDIRECT 53 08/10/2009    Additional studies/ records that were reviewed today include:   Echo 06/22/2017 LV EF: 25% -   30%  Study Conclusions  - Left ventricle: The cavity size was mildly dilated. Wall   thickness was normal. Systolic function was severely reduced. The   estimated ejection fraction was in the range of 25% to 30%.   Diffuse hypokinesis. Doppler parameters are consistent with   abnormal left ventricular relaxation (grade 1 diastolic   dysfunction). Doppler parameters are consistent  with high   ventricular filling pressure. - Mitral valve: There was mild regurgitation. - Left atrium: The atrium was mildly dilated. - Pulmonary arteries: Systolic pressure was moderately increased.  Impressions:  - Severe global reduction in LV systolic function; mild diastolic   dysfunction with elevated LV filling pressure; mild LVE; mild MR;   mild LAE; mild TR with moderately elevated pulmonary pressure.    ASSESSMENT:    1. Dilated cardiomyopathy (HCC)   2. Pre-procedure lab exam   3. PVC's (premature ventricular contractions)   4. Essential hypertension   5. Hyperlipidemia, unspecified hyperlipidemia type   6. Controlled type 2 diabetes mellitus without complication, without long-term current use of insulin (HCC)      PLAN:  In order of problems listed above:  1. LV dysfunction: Newly diagnosed on recent echocardiogram, various options been discussed with the patient and her husband, eventually we opted for left and right heart cath to rule out her dyspnea on exertion as angina equivalent. She is currently on ACE inhibitor, will add carvedilol, if cath looks good, we'll start on spironolactone and maybe consider switching to [facility] later  - Risk and benefit of procedure explained to the patient by Dr. Herbie Baltimore who display clear understanding and agree to proceed.  Discussed with patient possible procedural risk include bleeding, vascular injury, renal injury, arrythmia, MI, stroke and loss of limb or life.  2. PVCs: Pending 24-hour Holter monitor results. Recent bradycardia likely related to the fact PVC cannot be felt on manual and digital check. Recent EKG does not show significant bradycardia. Will start on carvedilol 3.125 mg twice a day as a trial to suppress PVCs and also for LV dysfunction.  3. Hyperlipidemia: Intolerant to statins  4. DM II: On metformin    Medication Adjustments/Labs and Tests Ordered: Current medicines are reviewed at length with the  patient today.  Concerns regarding medicines are outlined above.  Medication changes, Labs and Tests ordered today are listed in the Patient Instructions below. Patient Instructions  Medication Instructions:  Start carvedilol (coreg) 3.125 two times daily  Labwork: Pre-procedure labs TODAY (CBC, BMET, PT/INR)  Testing/Procedures: Cardiac Cath with Dr. Herbie Baltimore Friday 06/30/17 (see information sheet)  Follow-Up: Your physician recommends that you schedule a follow-up appointment in: 2 weeks with Azalee Course PA and 2-3 Months with Dr. Herbie Baltimore.    Any Other Special Instructions Will Be Listed Below (If Applicable).     If you need a refill on your cardiac medications before your next appointment, please call your pharmacy.    [facility] CARDIOVASCULAR DIVISION [facility] [address] [place] [address] Dept: [phone] Loc: [phone]  Natasha Woodard  06/27/2017  You are scheduled for a Cardiac Catheterization on Friday, July 20 with Dr. Bryan Lemma.  1. Please arrive at the [facility] (Main Entrance A) at [facility]: [address] [place], [place] [address] at 11:30 AM (two hours before your procedure to ensure your preparation). Free valet parking service is available.   Special note: Every effort is made to have your procedure done on time. Please understand that emergencies sometimes delay scheduled procedures.  2. Diet: Do not eat or drink anything after midnight prior to your procedure except sips of water to take medications.  3. Labs: You will need to have blood drawn on Tuesday, July 17 at [facility] [address], [place]  Open: 8am - 5pm (Lunch 12:30 - 1:30)   Phone: [phone]. You do not need to be fasting.  4. Medication instructions in preparation for your procedure:  Stop taking, Glucophage (Metformin) on Thursday, July 19.  (24 hours prior & will need to hold 48 hours  after)  On the morning of your procedure, take your Aspirin and any morning medicines NOT listed above.  You may use sips of water.  5. Plan for one night stay--bring personal belongings. 6. Bring a current list of your medications and current insurance cards. 7. You MUST have a responsible person to drive you home. 8. Someone MUST be with you the first 24 hours after you arrive home or your discharge will be delayed. 9. Please wear clothes that are easy to get on and off and wear slip-on shoes.  Thank you for allowing [place] to care for you!   -- [facility] Invasive Cardiovascular services     Signed, Azalee Course, [place]  06/27/2017 1:16 PM    [facility] [address] [place], [place], [place]  [address] Phone: [phone]; Fax: [phone]

## 2017-06-27 NOTE — Telephone Encounter (Signed)
Patient calling and would like to speak with Dr. Herbie Baltimore or Azalee Course. I advised patient that I would have to send message to nurse. Would not go into detail as to what she wanted.

## 2017-06-28 ENCOUNTER — Telehealth: Payer: Self-pay

## 2017-06-28 LAB — PROTIME-INR
INR: 1 (ref 0.8–1.2)
Prothrombin Time: 10.2 s (ref 9.1–12.0)

## 2017-06-28 LAB — CBC
Hematocrit: 44.1 % (ref 34.0–46.6)
Hemoglobin: 14.6 g/dL (ref 11.1–15.9)
MCH: 29.1 pg (ref 26.6–33.0)
MCHC: 33.1 g/dL (ref 31.5–35.7)
MCV: 88 fL (ref 79–97)
Platelets: 414 10*3/uL — ABNORMAL HIGH (ref 150–379)
RBC: 5.02 x10E6/uL (ref 3.77–5.28)
RDW: 13.7 % (ref 12.3–15.4)
WBC: 5.3 10*3/uL (ref 3.4–10.8)

## 2017-06-28 LAB — BASIC METABOLIC PANEL
BUN/Creatinine Ratio: 19 (ref 12–28)
BUN: 14 mg/dL (ref 8–27)
CO2: 24 mmol/L (ref 20–29)
Calcium: 10.2 mg/dL (ref 8.7–10.3)
Chloride: 102 mmol/L (ref 96–106)
Creatinine, Ser: 0.75 mg/dL (ref 0.57–1.00)
GFR calc Af Amer: 90 mL/min/{1.73_m2} (ref >59)
GFR calc non Af Amer: 78 mL/min/{1.73_m2} (ref >59)
Glucose: 91 mg/dL (ref 65–99)
Potassium: 5.5 mmol/L — ABNORMAL HIGH (ref 3.5–5.2)
Sodium: 142 mmol/L (ref 134–144)

## 2017-06-28 LAB — PLEASE NOTE

## 2017-06-28 NOTE — Telephone Encounter (Signed)
Patient contacted pre-catheterization at [facility] scheduled for:  06/30/2017 @ 1330 Verified arrival time and place:  NT @ 1130-gave Pt directions to NT  Confirmed AM meds to be taken pre-cath with sip of water:  Spent 5-10 minutes discussing metformin instructions with Pt.  Pt not happy to have to hold metformin, states she urinates all the time when she doesn't take metformin.  Discussed in detail metformin reaction with dye used in procedure.  Pt indicates understanding.    Notified Pt to take baby ASA prior to arrival. Confirmed patient has responsible person to drive home post procedure and observe patient for 24 hours:  Husband  Addl concerns:  None noted at this time.

## 2017-06-28 NOTE — Telephone Encounter (Signed)
Pt notified and she states thanks and that email was quite helpful

## 2017-06-30 ENCOUNTER — Encounter (HOSPITAL_COMMUNITY)
Admission: RE | Disposition: A | Discharge status: Home / Self Care | Payer: Self-pay | Source: Ambulatory Visit | Attending: Cardiology

## 2017-06-30 ENCOUNTER — Ambulatory Visit (HOSPITAL_COMMUNITY)
Admission: RE | Admit: 2017-06-30 | Discharge: 2017-06-30 | Disposition: A | Discharge status: Home / Self Care | Payer: Medicare Other | Source: Ambulatory Visit | Attending: Cardiology | Admitting: Cardiology

## 2017-06-30 DIAGNOSIS — K219 Gastro-esophageal reflux disease without esophagitis: Secondary | ICD-10-CM | POA: Diagnosis not present

## 2017-06-30 DIAGNOSIS — Z882 Allergy status to sulfonamides status: Secondary | ICD-10-CM | POA: Diagnosis not present

## 2017-06-30 DIAGNOSIS — R931 Abnormal findings on diagnostic imaging of heart and coronary circulation: Secondary | ICD-10-CM | POA: Diagnosis present

## 2017-06-30 DIAGNOSIS — Z7984 Long term (current) use of oral hypoglycemic drugs: Secondary | ICD-10-CM | POA: Insufficient documentation

## 2017-06-30 DIAGNOSIS — Z7982 Long term (current) use of aspirin: Secondary | ICD-10-CM | POA: Insufficient documentation

## 2017-06-30 DIAGNOSIS — I10 Essential (primary) hypertension: Secondary | ICD-10-CM | POA: Insufficient documentation

## 2017-06-30 DIAGNOSIS — E785 Hyperlipidemia, unspecified: Secondary | ICD-10-CM | POA: Diagnosis not present

## 2017-06-30 DIAGNOSIS — E119 Type 2 diabetes mellitus without complications: Secondary | ICD-10-CM | POA: Diagnosis not present

## 2017-06-30 DIAGNOSIS — E669 Obesity, unspecified: Secondary | ICD-10-CM | POA: Diagnosis not present

## 2017-06-30 DIAGNOSIS — Z87891 Personal history of nicotine dependence: Secondary | ICD-10-CM | POA: Diagnosis not present

## 2017-06-30 DIAGNOSIS — Z6832 Body mass index (BMI) 32.0-32.9, adult: Secondary | ICD-10-CM | POA: Diagnosis not present

## 2017-06-30 DIAGNOSIS — R0609 Other forms of dyspnea: Secondary | ICD-10-CM | POA: Diagnosis not present

## 2017-06-30 DIAGNOSIS — I493 Ventricular premature depolarization: Secondary | ICD-10-CM | POA: Diagnosis not present

## 2017-06-30 DIAGNOSIS — I2510 Atherosclerotic heart disease of native coronary artery without angina pectoris: Secondary | ICD-10-CM

## 2017-06-30 DIAGNOSIS — I420 Dilated cardiomyopathy: Secondary | ICD-10-CM | POA: Insufficient documentation

## 2017-06-30 DIAGNOSIS — I208 Other forms of angina pectoris: Secondary | ICD-10-CM | POA: Diagnosis present

## 2017-06-30 HISTORY — PX: RIGHT/LEFT HEART CATH AND CORONARY ANGIOGRAPHY: CATH118266

## 2017-06-30 LAB — POCT I-STAT 3, ART BLOOD GAS (G3+)
Acid-Base Excess: 1 mmol/L (ref 0.0–2.0)
BICARBONATE: 25.7 mmol/L (ref 20.0–28.0)
O2 SAT: 88 %
PCO2 ART: 41.5 mmHg (ref 32.0–48.0)
PH ART: 7.4 (ref 7.350–7.450)
PO2 ART: 54 mmHg — AB (ref 83.0–108.0)
TCO2: 27 mmol/L (ref 0–100)

## 2017-06-30 LAB — POCT I-STAT 3, VENOUS BLOOD GAS (G3P V)
BICARBONATE: 26 mmol/L (ref 20.0–28.0)
Bicarbonate: 25.6 mmol/L (ref 20.0–28.0)
O2 SAT: 61 %
O2 SAT: 61 %
PCO2 VEN: 44.4 mmHg (ref 44.0–60.0)
PO2 VEN: 33 mmHg (ref 32.0–45.0)
TCO2: 27 mmol/L (ref 0–100)
TCO2: 27 mmol/L (ref 0–100)
pCO2, Ven: 45.5 mmHg (ref 44.0–60.0)
pH, Ven: 7.366 (ref 7.250–7.430)
pH, Ven: 7.369 (ref 7.250–7.430)
pO2, Ven: 33 mmHg (ref 32.0–45.0)

## 2017-06-30 LAB — GLUCOSE, CAPILLARY
Glucose-Capillary: 93 mg/dL (ref 65–99)
Glucose-Capillary: 72 mg/dL (ref 65–99)

## 2017-06-30 SURGERY — RIGHT/LEFT HEART CATH AND CORONARY ANGIOGRAPHY
Anesthesia: LOCAL

## 2017-06-30 MED ORDER — HEPARIN SODIUM (PORCINE) 1000 UNIT/ML IJ SOLN
INTRAMUSCULAR | Status: AC
Start: 2017-06-30 — End: 2017-06-30
  Filled 2017-06-30: qty 1

## 2017-06-30 MED ORDER — SODIUM CHLORIDE 0.9% FLUSH: 3.0000 mL | Freq: Two times a day (BID) | INTRAVENOUS | Status: DC

## 2017-06-30 MED ORDER — LIDOCAINE HCL (PF) 1 % IJ SOLN
INTRAMUSCULAR | Status: AC
  Filled 2017-06-30: qty 30

## 2017-06-30 MED ORDER — SODIUM CHLORIDE 0.9 % IV SOLN: 250.0000 mL | INTRAVENOUS | Status: DC | PRN

## 2017-06-30 MED ORDER — FENTANYL CITRATE (PF) 100 MCG/2ML IJ SOLN
INTRAMUSCULAR | Status: AC
  Filled 2017-06-30: qty 2

## 2017-06-30 MED ORDER — ONDANSETRON HCL 4 MG/2ML IJ SOLN: 4.0000 mg | Freq: Four times a day (QID) | INTRAMUSCULAR | Status: DC | PRN

## 2017-06-30 MED ORDER — SODIUM CHLORIDE 0.9% FLUSH: 3.0000 mL | INTRAVENOUS | Status: DC | PRN

## 2017-06-30 MED ORDER — MIDAZOLAM HCL 2 MG/2ML IJ SOLN
INTRAMUSCULAR | Status: AC
  Filled 2017-06-30: qty 2

## 2017-06-30 MED ORDER — ACETAMINOPHEN 325 MG PO TABS: 650.0000 mg | ORAL_TABLET | ORAL | Status: DC | PRN

## 2017-06-30 MED ORDER — HEPARIN (PORCINE) IN NACL 2-0.9 UNIT/ML-% IJ SOLN
INTRAMUSCULAR | Status: DC | PRN
  Administered 2017-06-30: 14:00:00

## 2017-06-30 MED ORDER — MIDAZOLAM HCL 2 MG/2ML IJ SOLN
INTRAMUSCULAR | Status: DC | PRN
  Administered 2017-06-30: 2 mg via INTRAVENOUS

## 2017-06-30 MED ORDER — ASPIRIN 81 MG PO CHEW: 81.0000 mg | CHEWABLE_TABLET | ORAL | Status: DC

## 2017-06-30 MED ORDER — SODIUM CHLORIDE 0.9 % IV SOLN: INTRAVENOUS | Status: DC

## 2017-06-30 MED ORDER — HEPARIN SODIUM (PORCINE) 1000 UNIT/ML IJ SOLN
INTRAMUSCULAR | Status: DC | PRN
  Administered 2017-06-30: 4000 [IU] via INTRAVENOUS

## 2017-06-30 MED ORDER — HEPARIN (PORCINE) IN NACL 2-0.9 UNIT/ML-% IJ SOLN
INTRAMUSCULAR | Status: DC | PRN
  Administered 2017-06-30: 10 mL via INTRA_ARTERIAL

## 2017-06-30 MED ORDER — IOPAMIDOL (ISOVUE-370) INJECTION 76%
INTRAVENOUS | Status: DC | PRN
  Administered 2017-06-30: 90 mL

## 2017-06-30 MED ORDER — FENTANYL CITRATE (PF) 100 MCG/2ML IJ SOLN
INTRAMUSCULAR | Status: DC | PRN
  Administered 2017-06-30: 25 ug via INTRAVENOUS

## 2017-06-30 MED ORDER — LIDOCAINE HCL (PF) 1 % IJ SOLN
INTRAMUSCULAR | Status: DC | PRN
  Administered 2017-06-30 (×2): 2 mL

## 2017-06-30 MED ORDER — VERAPAMIL HCL 2.5 MG/ML IV SOLN
INTRAVENOUS | Status: AC
  Filled 2017-06-30: qty 2

## 2017-06-30 MED ORDER — HEPARIN (PORCINE) IN NACL 2-0.9 UNIT/ML-% IJ SOLN
INTRAMUSCULAR | Status: AC
  Filled 2017-06-30: qty 1000

## 2017-06-30 SURGICAL SUPPLY — 13 items
CATH BALLN WEDGE 5F 110CM (CATHETERS) ×1 IMPLANT
CATH INFINITI 5 FR 3DRC (CATHETERS) ×1 IMPLANT
CATH INFINITI 5FR ANG PIGTAIL (CATHETERS) ×1 IMPLANT
CATH OPTITORQUE TIG 4.0 5F (CATHETERS) ×1 IMPLANT
DEVICE RAD COMP TR BAND LRG (VASCULAR PRODUCTS) ×1 IMPLANT
GLIDESHEATH SLEND A-KIT 6F 22G (SHEATH) ×1 IMPLANT
GUIDEWIRE INQWIRE 1.5J.035X260 (WIRE) IMPLANT
INQWIRE 1.5J .035X260CM (WIRE) ×2
KIT HEART LEFT (KITS) ×2 IMPLANT
PACK CARDIAC CATHETERIZATION (CUSTOM PROCEDURE TRAY) ×2 IMPLANT
SHEATH GLIDE SLENDER 4/5FR (SHEATH) ×1 IMPLANT
TRANSDUCER W/STOPCOCK (MISCELLANEOUS) ×2 IMPLANT
TUBING CIL FLEX 10 FLL-RA (TUBING) ×2 IMPLANT

## 2017-06-30 NOTE — Brief Op Note (Signed)
    06/30/2017  2:16 PM  PATIENT:  Natasha Woodard  76 y.o. female with newly diagnosed dilated cardiomyopathy with profound exertional dyspnea and frequent PVCs being referred for evaluation with cardiac catheterization.  PRE-OPERATIVE DIAGNOSIS:  dilated cardiomyopathy  POST-OPERATIVE DIAGNOSIS: Likely nonischemic cardiomyopathy Moderate diffuse disease in the mid to distal LAD as well as proximal RCA.  PROCEDURE:  Procedure(s): Right/Left Heart Cath and Coronary Angiography (N/A)  SURGEON:  Surgeon(s) and Role:    * Marykay Lex, MD - Primary  ANESTHESIA:   local and IV sedation - 3 mL subcutaneous lidocaine; 2 mg Versed , 25 g fentanyl  EBL:  <20 mL  BLOOD ADMINISTERED:none  PROCEDURE:  Right radial access using modified Seldinger technique placing 6 French sheath. Right brachial access: Existing IV exchanged for a 5 French sheath.  RHC: 5 French Swan-Ganz catheter advanced for pressure measurements and PA sats.  LHC: 5 [place] TIG followed by 3-D RC guide then angled pigtail catheter - used for first the left than right coronary angiography followed by LV hemodynamics and left ventriculography via hand injection  MEDICATIONS USED:  3 mg verapamil in 10 mL radial cocktail; 4000 units IV heparin  TOURNIQUET:  TR been applied 1415, 14 mL  DICTATION: .Dragon Dictation  PLAN OF CARE: Discharge to home after PACU  PATIENT DISPOSITION:  PACU - hemodynamically stable.   Delay start of Pharmacological VTE agent (>24hrs) due to surgical blood loss or risk of bleeding: not applicable   Bryan Lemma, M.D., M.S. Interventional Cardiologist   Pager # [phone] Phone # [phone] [address] [place], [place] [address]

## 2017-06-30 NOTE — Interval H&P Note (Signed)
History and Physical Interval Note:  06/30/2017 1:14 PM  Natasha Woodard  has presented today for surgery, with the diagnosis of dilated cardiomyopathy with exertional dyspnea and potential atypical  angina,   The various methods of treatment have been discussed with the patient and family. After consideration of risks, benefits and other options for treatment, the patient has consented to  Procedure(s): Right/Left Heart Cath and Coronary Angiography (N/A)  With Possible Percutaneous Coronary Intervention as a surgical intervention .  The patient's history has been reviewed, patient examined, no change in status, stable for surgery.  I have reviewed the patient's chart and labs.  Questions were answered to the patient's satisfaction.    Cath Lab Visit (complete for each Cath Lab visit)  Clinical Evaluation Leading to the Procedure:   ACS: No.  Non-ACS:    Anginal Classification: CCS III - exertional dyspnea  Anti-ischemic medical therapy: Minimal Therapy (1 class of medications)  Non-Invasive Test Results: High-risk stress test findings: cardiac mortality >3%/year - echo with EF of 20 and 25%  Prior CABG: No previous CABG   Bryan Lemma

## 2017-06-30 NOTE — H&P (View-Only) (Signed)
Cardiology Office Note    Date:  06/27/2017   ID:  Donne, Robillard [birth year], MRN [id number]  PCP:  [name], MD  Cardiologist:  Dr. Herbie Baltimore  Chief Complaint  Patient presents with  . Follow-up    seen with Dr. Herbie Baltimore    History of Present Illness:  Natasha Woodard is a 76 y.o. female with PMH of DM II, HLD intolerant to statins, HTN, and obesity. On the most recent visit, it was noticed her heart rate was in the high 40s. EKG showed PVCs. She was referred to cardiology service for further evaluation. She denies any chest pain or shortness of breath. She denies any low extremity edema, orthopnea or PND. On physical exam, she does have occasional skipped heartbeat. EKG showed she has both PVCs and PACs. The PVCs are generally benign and may not need treatment unless become more frequent.   I last saw the patient on 06/09/2017, after discussing with DOD Dr. Herbie Baltimore, I recommended echocardiogram and 24-hour Holter monitor to assess for the PVC burden. Echocardiogram however came back severely abnormal showing EF 25-30%. I initially recommended outpatient Myoview to assess for ischemic versus nonischemic etiology, however due to a miscommunication, patient was under the belief that she has some suffered a heart attack in the past. I called that the patient is morning, she was very upset and says she did not get any sleep last night because of our nurse told her she has suffered a heart attack in the past. I did apologize for the fact and told her that there is no current evidence that she has suffered a heart attack in the past, although having blockage is a small possibility. There are ischemic and nonischemic etiologies behind LV dysfunction including but not limited to history of coronary artery disease/MI, chronically uncontrolled high blood pressure, stress induced cardiomyopathy, chronic alcohol abuse, atrial fibrillation with RVR, chemotherapy, post viral etiology and  uncontrolled hyperthyroidism. We don't know at this point what caused her LV dysfunction. I did bring Dr. Herbie Baltimore along with me to discuss the case with the patient and her husband to clarify some of the confusion. According to the patient, she is just first started noticing fatigue and shortness of breath with exertion since January. However she has not gone back to exercise since last year, she just returned to exercise a month ago and it really started noticing dyspnea on exertion and worsening fatigue. It is unclear to [place] with the duration of her LV dysfunction, however it is possible it has been going on for at least 6 months, and that was previously undiagnosed until now. After discussing various options including stress test and definitive examination, she opted for left and right heart cath for definitive evaluation to rule out DOE as angina equivalent.   Dr. Herbie Baltimore spent > 1 hour talking with the patient and her husband.   Past Medical History:  Diagnosis Date  . Calcific tendonitis 2008   Treatment of left leg  . CTS (carpal tunnel syndrome)    worse on right hand/ wears wrist splint  . Diabetes mellitus    Type II  . Hiatal hernia    with reflux  . Hyperlipidemia    Statin intolerant  . Hypertension   . Ichthyosis congenita   . NSVD (normal spontaneous vaginal delivery) 1971 & 1972  . Obesity   . Plantar fasciitis    Left  . Pulmonary nodule    Imaged multiple times and benign appearing  .  Wears glasses     Past Surgical History:  Procedure Laterality Date  . ABDOMINAL HYSTERECTOMY  1975   TAH-BSO  . ABSCESS DRAINAGE  1972   right breast  . BREAST BIOPSY Left 01/14/2009   Stereo- Benign  . CARPAL TUNNEL RELEASE Right 07/02/2014   Procedure: RIGHT CARPAL TUNNEL RELEASE;  Surgeon: Nicki Reaper, MD;  Location: [facility];  Service: Orthopedics;  Laterality: Right;  . CARPAL TUNNEL RELEASE Left 06/11/2015   Procedure: LEFT CARPAL TUNNEL RELEASE;  Surgeon:  Cindee Salt, MD;  Location: [facility];  Service: Orthopedics;  Laterality: Left;  REGIONAL/FAB  . COLONOSCOPY    . corn removal  1968   right  . ROTATOR CUFF REPAIR     left shoulder    Current Medications: Outpatient Medications Prior to Visit  Medication Sig Dispense Refill  . amLODipine (NORVASC) 5 MG tablet TAKE ONE TABLET BY MOUTH EVERY DAY 90 tablet 3  . aspirin 81 MG tablet Take 81 mg by mouth daily.      . Cholecalciferol (VITAMIN D) 1000 UNITS capsule Take 1,000 Units by mouth daily.     . enalapril (VASOTEC) 20 MG tablet TAKE ONE TABLET BY MOUTH ONCE DAILY 90 tablet 3  . glucose blood (ACCU-CHEK AVIVA PLUS) test strip USE TO TEST BLOOD SUGAR ONCE DAILY FOR DM 250.00 100 each 3  . Ibuprofen (ADVIL) 200 MG CAPS Take 1 capsule by mouth 2 (two) times daily.    . Lancets (ACCU-CHEK MULTICLIX) lancets USE ONE LANCET TO TEST BLOOD GLUCOSE DAILY FOR DM 250.00 102 each 3  . metFORMIN (GLUCOPHAGE) 500 MG tablet Take 0.5 tablets (250 mg total) by mouth 2 (two) times daily with a meal. 90 tablet 3  . traMADol (ULTRAM) 50 MG tablet Take 1 tablet (50 mg total) by mouth every 12 (twelve) hours as needed. (Patient taking differently: Take 50 mg by mouth 2 (two) times daily. ) 180 tablet 1   No facility-administered medications prior to visit.      Allergies:   Ezetimibe-simvastatin; Pravastatin sodium; and Sulfonamide derivatives   Social History   Social History  . Marital status: Married    Spouse name: N/A  . Number of children: 2  . Years of education: N/A   Occupational History  .  Retired   Social History Main Topics  . Smoking status: Former Smoker    Years: 28.00    Types: Cigarettes    Quit date: 12/12/1993  . Smokeless tobacco: Never Used  . Alcohol use No  . Drug use: No  . Sexual activity: No   Other Topics Concern  . None   Social History Narrative   Two kids, two grandchildren.    Married 1969   Retired.      Family History:  The patient's  family history includes Cancer in her brother; Diabetes in her father and mother; Heart disease in her brother, father, and mother.   ROS:   Please see the history of present illness.    ROS All other systems reviewed and are negative.   PHYSICAL EXAM:   VS:  BP (!) 160/84 (BP Location: Right Arm, Cuff Size: Normal)   Pulse 82   Ht 5\' 2"  (1.575 m)   Wt 176 lb 6.4 oz (80 kg)   BMI 32.26 kg/m    GEN: Well nourished, well developed, in no acute distress  HEENT: normal  Neck: no JVD, carotid bruits, or masses Cardiac: RRR; no murmurs, rubs,  or gallops,no edema  Respiratory:  clear to auscultation bilaterally, normal work of breathing GI: soft, nontender, nondistended, + BS MS: no deformity or atrophy  Skin: warm and dry, no rash Neuro:  Alert and Oriented x 3, Strength and sensation are intact Psych: euthymic mood, full affect  Wt Readings from Last 3 Encounters:  06/27/17 176 lb 6.4 oz (80 kg)  06/09/17 175 lb (79.4 kg)  05/30/17 175 lb 12 oz (79.7 kg)      Studies/Labs Reviewed:   EKG:  EKG is not ordered today.    Recent Labs: 05/30/2017: ALT 10; BUN 12; Creatinine, Ser 0.78; Hemoglobin 14.7; Platelets 399.0; Potassium 4.4; Sodium 138; TSH 0.74   Lipid Panel    Component Value Date/Time   CHOL 183 09/15/2016 1253   TRIG 129.0 09/15/2016 1253   HDL 39.30 09/15/2016 1253   CHOLHDL 5 09/15/2016 1253   VLDL 25.8 09/15/2016 1253   LDLCALC 118 (H) 09/15/2016 1253   LDLDIRECT 53 08/10/2009    Additional studies/ records that were reviewed today include:   Echo 06/22/2017 LV EF: 25% -   30%  Study Conclusions  - Left ventricle: The cavity size was mildly dilated. Wall   thickness was normal. Systolic function was severely reduced. The   estimated ejection fraction was in the range of 25% to 30%.   Diffuse hypokinesis. Doppler parameters are consistent with   abnormal left ventricular relaxation (grade 1 diastolic   dysfunction). Doppler parameters are consistent  with high   ventricular filling pressure. - Mitral valve: There was mild regurgitation. - Left atrium: The atrium was mildly dilated. - Pulmonary arteries: Systolic pressure was moderately increased.  Impressions:  - Severe global reduction in LV systolic function; mild diastolic   dysfunction with elevated LV filling pressure; mild LVE; mild MR;   mild LAE; mild TR with moderately elevated pulmonary pressure.    ASSESSMENT:    1. Dilated cardiomyopathy (HCC)   2. Pre-procedure lab exam   3. PVC's (premature ventricular contractions)   4. Essential hypertension   5. Hyperlipidemia, unspecified hyperlipidemia type   6. Controlled type 2 diabetes mellitus without complication, without long-term current use of insulin (HCC)      PLAN:  In order of problems listed above:  1. LV dysfunction: Newly diagnosed on recent echocardiogram, various options been discussed with the patient and her husband, eventually we opted for left and right heart cath to rule out her dyspnea on exertion as angina equivalent. She is currently on ACE inhibitor, will add carvedilol, if cath looks good, we'll start on spironolactone and maybe consider switching to [facility] later  - Risk and benefit of procedure explained to the patient by Dr. Herbie Baltimore who display clear understanding and agree to proceed.  Discussed with patient possible procedural risk include bleeding, vascular injury, renal injury, arrythmia, MI, stroke and loss of limb or life.  2. PVCs: Pending 24-hour Holter monitor results. Recent bradycardia likely related to the fact PVC cannot be felt on manual and digital check. Recent EKG does not show significant bradycardia. Will start on carvedilol 3.125 mg twice a day as a trial to suppress PVCs and also for LV dysfunction.  3. Hyperlipidemia: Intolerant to statins  4. DM II: On metformin    Medication Adjustments/Labs and Tests Ordered: Current medicines are reviewed at length with the  patient today.  Concerns regarding medicines are outlined above.  Medication changes, Labs and Tests ordered today are listed in the Patient Instructions below. Patient Instructions  Medication Instructions:  Start carvedilol (coreg) 3.125 two times daily  Labwork: Pre-procedure labs TODAY (CBC, BMET, PT/INR)  Testing/Procedures: Cardiac Cath with Dr. Herbie Baltimore Friday 06/30/17 (see information sheet)  Follow-Up: Your physician recommends that you schedule a follow-up appointment in: 2 weeks with Azalee Course PA and 2-3 Months with Dr. Herbie Baltimore.    Any Other Special Instructions Will Be Listed Below (If Applicable).     If you need a refill on your cardiac medications before your next appointment, please call your pharmacy.    [facility] CARDIOVASCULAR DIVISION [facility] [address] [place] [address] Dept: [phone] Loc: [phone]  Natasha Woodard  06/27/2017  You are scheduled for a Cardiac Catheterization on Friday, July 20 with Dr. Bryan Lemma.  1. Please arrive at the [facility] (Main Entrance A) at [facility]: [address] [place], [place] [address] at 11:30 AM (two hours before your procedure to ensure your preparation). Free valet parking service is available.   Special note: Every effort is made to have your procedure done on time. Please understand that emergencies sometimes delay scheduled procedures.  2. Diet: Do not eat or drink anything after midnight prior to your procedure except sips of water to take medications.  3. Labs: You will need to have blood drawn on Tuesday, July 17 at [facility] [address], [place]  Open: 8am - 5pm (Lunch 12:30 - 1:30)   Phone: [phone]. You do not need to be fasting.  4. Medication instructions in preparation for your procedure:  Stop taking, Glucophage (Metformin) on Thursday, July 19.  (24 hours prior & will need to hold 48 hours  after)  On the morning of your procedure, take your Aspirin and any morning medicines NOT listed above.  You may use sips of water.  5. Plan for one night stay--bring personal belongings. 6. Bring a current list of your medications and current insurance cards. 7. You MUST have a responsible person to drive you home. 8. Someone MUST be with you the first 24 hours after you arrive home or your discharge will be delayed. 9. Please wear clothes that are easy to get on and off and wear slip-on shoes.  Thank you for allowing [place] to care for you!   -- [facility] Invasive Cardiovascular services     Signed, Azalee Course, [place]  06/27/2017 1:16 PM    [facility] [address] [place], [place], [place]  [address] Phone: [phone]; Fax: [phone]

## 2017-06-30 NOTE — Progress Notes (Signed)
    S:  Called to see pt in the cath lab holding area. She is s/p diagnostic cath via radial approach.  Following the procedure, she reported seeing floaters in both eyes, R>L.  She describes these as 'silver lines' intermittently moving across her vision.  She has noted floaters in her vision before - at least a few x/yr.  She denies c/p, dyspnea, or wkns.  Speech is normal and appropriate.  O:   Vitals:   06/30/17 1406 06/30/17 1411  BP: 138/75 (!) 158/98  Pulse: 75 80  Resp: 18 16  Temp:     Pleasant, NAD, AAOx3.  Neuro - CN II-XII grossly intact.  PERRLA. Strength 5/5 bilat upper/lower ext. Nl heel to shin (though pt lying in stretcher), and rapid alternating mvmts.  A/P:  1.  Visual field disturbance:  Pt c/o seeing floaters across her bilat visual fields, L>R, described as 'silver lines.' She has a h/o floaters, and notes them occasionally, though thinks it's only a few x/yr.  They resolved shortly after my examination, which overall was unrevealing.  I have discussed her case with Dr. Allyson Sabal.  In the absence of focal symptoms and with resolution of Ss, no further imaging @ this time.  Pt to short stay.  Consider head ct if recurrence.  Nicolasa Ducking, NP

## 2017-06-30 NOTE — Discharge Instructions (Signed)

## 2017-07-03 ENCOUNTER — Encounter (HOSPITAL_COMMUNITY): Payer: Self-pay | Admitting: Cardiology

## 2017-07-04 ENCOUNTER — Telehealth: Payer: Self-pay | Admitting: Cardiology

## 2017-07-04 NOTE — Telephone Encounter (Signed)
New message    Pt is calling asking for a call back from the nurse. She said it's about her procedure she had on Friday.

## 2017-07-04 NOTE — Telephone Encounter (Signed)
Spoke with pt about cath and upcoming app and her new medication, amlodipine and she states that she does not want to see a PA without knowing for sure that that Dr Herbie Baltimore know that she is seeing them, reassured pt that dr harding is here the day of her appt and if needed speak with Dr harding about her new medication if needed

## 2017-07-11 ENCOUNTER — Telehealth: Payer: Self-pay | Admitting: Family Medicine

## 2017-07-11 ENCOUNTER — Emergency Department (HOSPITAL_COMMUNITY)
Admission: EM | Admit: 2017-07-11 | Discharge: 2017-07-12 | Disposition: A | Discharge status: Home / Self Care | Payer: Medicare Other | Attending: Emergency Medicine | Admitting: Emergency Medicine

## 2017-07-11 ENCOUNTER — Encounter (HOSPITAL_COMMUNITY): Payer: Self-pay

## 2017-07-11 ENCOUNTER — Emergency Department (HOSPITAL_COMMUNITY): Payer: Medicare Other

## 2017-07-11 DIAGNOSIS — Z7982 Long term (current) use of aspirin: Secondary | ICD-10-CM | POA: Insufficient documentation

## 2017-07-11 DIAGNOSIS — Y9301 Activity, walking, marching and hiking: Secondary | ICD-10-CM | POA: Insufficient documentation

## 2017-07-11 DIAGNOSIS — Y999 Unspecified external cause status: Secondary | ICD-10-CM | POA: Diagnosis not present

## 2017-07-11 DIAGNOSIS — S6992XA Unspecified injury of left wrist, hand and finger(s), initial encounter: Secondary | ICD-10-CM | POA: Diagnosis not present

## 2017-07-11 DIAGNOSIS — I10 Essential (primary) hypertension: Secondary | ICD-10-CM | POA: Diagnosis not present

## 2017-07-11 DIAGNOSIS — E119 Type 2 diabetes mellitus without complications: Secondary | ICD-10-CM | POA: Diagnosis not present

## 2017-07-11 DIAGNOSIS — Z87891 Personal history of nicotine dependence: Secondary | ICD-10-CM | POA: Diagnosis not present

## 2017-07-11 DIAGNOSIS — W109XXA Fall (on) (from) unspecified stairs and steps, initial encounter: Secondary | ICD-10-CM | POA: Insufficient documentation

## 2017-07-11 DIAGNOSIS — Y929 Unspecified place or not applicable: Secondary | ICD-10-CM | POA: Diagnosis not present

## 2017-07-11 DIAGNOSIS — S62102A Fracture of unspecified carpal bone, left wrist, initial encounter for closed fracture: Secondary | ICD-10-CM

## 2017-07-11 DIAGNOSIS — S52612A Displaced fracture of left ulna styloid process, initial encounter for closed fracture: Secondary | ICD-10-CM | POA: Diagnosis not present

## 2017-07-11 DIAGNOSIS — S52592A Other fractures of lower end of left radius, initial encounter for closed fracture: Secondary | ICD-10-CM | POA: Insufficient documentation

## 2017-07-11 DIAGNOSIS — Z7984 Long term (current) use of oral hypoglycemic drugs: Secondary | ICD-10-CM | POA: Insufficient documentation

## 2017-07-11 DIAGNOSIS — Z79899 Other long term (current) drug therapy: Secondary | ICD-10-CM | POA: Diagnosis not present

## 2017-07-11 DIAGNOSIS — S52502A Unspecified fracture of the lower end of left radius, initial encounter for closed fracture: Secondary | ICD-10-CM | POA: Diagnosis not present

## 2017-07-11 DIAGNOSIS — M25532 Pain in left wrist: Secondary | ICD-10-CM | POA: Diagnosis not present

## 2017-07-11 MED ORDER — KETOROLAC TROMETHAMINE 60 MG/2ML IM SOLN
30.0000 mg | Freq: Once | INTRAMUSCULAR | Status: AC
  Administered 2017-07-11: 30 mg via INTRAMUSCULAR
  Filled 2017-07-11: qty 2

## 2017-07-11 NOTE — ED Provider Notes (Signed)
[facility]-EMERGENCY DEPT Provider Note   CSN: [id number] Arrival date & time: 07/11/17  1748  By signing my name below, I, Rosario Adie, attest that this documentation has been prepared under the direction and in the presence of Feleshia Zundel, MD. Electronically Signed: Rosario Adie, ED Scribe. 07/11/17. 11:28 PM.  History   Chief Complaint Chief Complaint  Patient presents with  . Fall  . Wrist Pain   The history is provided by the patient. No language interpreter was used.  Fall  This is a new problem. The current episode started 1 to 2 hours ago. The problem occurs constantly. The problem has been gradually worsening. Pertinent negatives include no chest pain and no shortness of breath. Exacerbated by: movement. Nothing relieves the symptoms. Treatments tried: splinting. The treatment provided no relief.    HPI Comments: Natasha Woodard is a 76 y.o. female with a PMHx of DM, HTN, HLD, CTS, who presents to the Emergency Department complaining of sudden onset, worsening left wrist pain beginning s/p mechanical fall which occurred tonight prior to arrival. Pt reports that she was walking down a small set of steps when she tripped down the last two, causing her to fall forwards with her left arm outstretched. No LOC or head injury. Pt notes associated left wrist swelling. She attempted to splint the arm with towels and a magazine prior to coming into the ED; no other noted treatments for her pain were tried. Her pain is worse with movement of the wrist. She denies numbness, weakness, or any other associated symptoms.   Past Medical History:  Diagnosis Date  . Calcific tendonitis 2008   Treatment of left leg  . CTS (carpal tunnel syndrome)    worse on right hand/ wears wrist splint  . Diabetes mellitus    Type II  . Hiatal hernia    with reflux  . Hyperlipidemia    Statin intolerant  . Hypertension   . Ichthyosis congenita   . NSVD (normal spontaneous vaginal delivery)  1971 & 1972  . Obesity   . Plantar fasciitis    Left  . Pulmonary nodule    Imaged multiple times and benign appearing  . Wears glasses    Patient Active Problem List   Diagnosis Date Noted  . Dilated cardiomyopathy (HCC) 06/30/2017  . Frequent PVCs 06/30/2017  . Dyspnea on exertion 06/30/2017  . Atypical angina (HCC) 06/30/2017  . Pulse regularly irregular 05/31/2017  . Advance care planning 08/19/2014  . Medicare annual wellness visit, subsequent 07/30/2013  . Hot flashes 08/15/2012  . KNEE PAIN, LEFT 11/09/2010  . Diabetes mellitus without complication (HCC) 05/19/2010  . HLD (hyperlipidemia) 05/19/2010  . BMI 30.0-30.9,adult 05/19/2010  . Essential hypertension 05/19/2010  . EMPHYSEMA 05/19/2010  . HIATAL HERNIA WITH REFLUX 05/19/2010  . OTHER CONGENITAL ANOMALY GALLBLADDER BDS&LIVER 05/19/2010  . ICHTHYOSIS CONGENITA 05/19/2010   Past Surgical History:  Procedure Laterality Date  . ABDOMINAL HYSTERECTOMY  1975   TAH-BSO  . ABSCESS DRAINAGE  1972   right breast  . BREAST BIOPSY Left 01/14/2009   Stereo- Benign  . CARPAL TUNNEL RELEASE Right 07/02/2014   Procedure: RIGHT CARPAL TUNNEL RELEASE;  Surgeon: Nicki Reaper, MD;  Location: [facility];  Service: Orthopedics;  Laterality: Right;  . CARPAL TUNNEL RELEASE Left 06/11/2015   Procedure: LEFT CARPAL TUNNEL RELEASE;  Surgeon: Cindee Salt, MD;  Location: [facility];  Service: Orthopedics;  Laterality: Left;  REGIONAL/FAB  . COLONOSCOPY    .  corn removal  1968   right  . RIGHT/LEFT HEART CATH AND CORONARY ANGIOGRAPHY N/A 06/30/2017   Procedure: Right/Left Heart Cath and Coronary Angiography;  Surgeon: Marykay Lex, MD;  Location: [facility] INVASIVE CV LAB;  Service: Cardiovascular;  Laterality: N/A;  . ROTATOR CUFF REPAIR     left shoulder   OB History    No data available     Home Medications    Prior to Admission medications   Medication Sig Start Date End Date Taking? Authorizing  Provider  amLODipine (NORVASC) 5 MG tablet TAKE ONE TABLET BY MOUTH EVERY DAY 09/22/16   [name], MD  aspirin 81 MG tablet Take 81 mg by mouth daily.      [provider]  carvedilol (COREG) 3.125 MG tablet Take 1 tablet (3.125 mg total) by mouth 2 (two) times daily. 06/27/17   Azalee Course, PA  Cholecalciferol (VITAMIN D) 1000 UNITS capsule Take 1,000 Units by mouth daily.     [provider]  enalapril (VASOTEC) 20 MG tablet TAKE ONE TABLET BY MOUTH ONCE DAILY 09/22/16   [name], MD  glucose blood (ACCU-CHEK AVIVA PLUS) test strip USE TO TEST BLOOD SUGAR ONCE DAILY FOR DM 250.00 08/19/14   [name], MD  Ibuprofen (ADVIL) 200 MG CAPS Take 400 mg by mouth 2 (two) times daily.     [provider]  Lancets (ACCU-CHEK MULTICLIX) lancets USE ONE LANCET TO TEST BLOOD GLUCOSE DAILY FOR DM 250.00 08/19/14   [name], MD  metFORMIN (GLUCOPHAGE) 500 MG tablet Take 0.5 tablets (250 mg total) by mouth 2 (two) times daily with a meal. 09/22/16   [name], MD  traMADol (ULTRAM) 50 MG tablet Take 1 tablet (50 mg total) by mouth every 12 (twelve) hours as needed. Patient taking differently: Take 50 mg by mouth 2 (two) times daily as needed for moderate pain.  01/31/17   [name], MD   Family History Family History  Problem Relation Age of Onset  . Heart disease Mother        s/p pacemaker  . Diabetes Mother   . Heart disease Father   . Diabetes Father   . Heart disease Brother        MI  . Cancer Brother        Lung  . Colon cancer Neg Hx   . Breast cancer Neg Hx    Social History Social History  Substance Use Topics  . Smoking status: Former Smoker    Years: 28.00    Types: Cigarettes    Quit date: 12/12/1993  . Smokeless tobacco: Never Used  . Alcohol use No   Allergies   Ezetimibe-simvastatin; Pravastatin sodium; and Sulfonamide derivatives  Review of Systems Review of Systems  Constitutional: Negative for appetite  change, chills and fever.  HENT: Negative for drooling and facial swelling.   Eyes: Negative for photophobia.  Respiratory: Negative for shortness of breath.   Cardiovascular: Negative for chest pain, palpitations and leg swelling.  Gastrointestinal: Negative for anal bleeding.  Genitourinary: Negative for difficulty urinating.  Musculoskeletal: Positive for arthralgias, joint swelling and myalgias. Negative for neck stiffness.  Skin: Negative for pallor.  Neurological: Negative for facial asymmetry, speech difficulty, weakness and numbness.  Psychiatric/Behavioral: Negative for suicidal ideas.  All other systems reviewed and are negative.  Physical Exam Updated Vital Signs BP (!) 166/102 (BP Location: Left Arm)   Pulse 85   Temp 98.8 F (37.1 C) (Oral)  Resp (!) 22   Ht 5\' 2"  (1.575 m)   Wt 178 lb (80.7 kg)   SpO2 98%   BMI 32.56 kg/m   Physical Exam  Constitutional: She is oriented to person, place, and time. She appears well-developed and well-nourished. No distress.  HENT:  Head: Normocephalic and atraumatic.  Mouth/Throat: Oropharynx is clear and moist. No oropharyngeal exudate.  Eyes: Pupils are equal, round, and reactive to light. Conjunctivae and EOM are normal. Right eye exhibits no discharge. Left eye exhibits no discharge. No scleral icterus.  Neck: Normal range of motion. Neck supple. No JVD present. No tracheal deviation present.  Trachea is midline. No stridor or carotid bruits.   Cardiovascular: Normal rate, regular rhythm, normal heart sounds and intact distal pulses.   No murmur heard. Pulmonary/Chest: Effort normal and breath sounds normal. No stridor. No respiratory distress. She has no wheezes. She has no rales.  Lungs CTA bilaterally.  Abdominal: Soft. Bowel sounds are normal. She exhibits no distension. There is no tenderness. There is no rebound and no guarding.  Musculoskeletal: She exhibits deformity. She exhibits no edema.       Left elbow: Normal.         Left wrist: She exhibits tenderness, bony tenderness and deformity. She exhibits normal range of motion, no effusion, no crepitus and no laceration.       Left forearm: Normal.       Left hand: Normal. She exhibits normal capillary refill. Normal sensation noted. Normal strength noted.  Intact capillary refill. Deformity of the distal radius. Biceps tendons and triceps tendons are intact. Left shoulder is well seated. No deformity of the clavicle. No foreshortening or malrotation of the BLE.   Lymphadenopathy:    She has no cervical adenopathy.  Neurological: She is alert and oriented to person, place, and time. She has normal reflexes. She displays normal reflexes. She exhibits normal muscle tone.  Skin: Skin is warm and dry. Capillary refill takes less than 2 seconds.  Psychiatric: She has a normal mood and affect. Her behavior is normal.  Nursing note and vitals reviewed.  ED Treatments / Results  DIAGNOSTIC STUDIES: Oxygen Saturation is 98% on RA, normal by my interpretation.   COORDINATION OF CARE: 11:27 PM-Discussed next steps with pt. Pt verbalized understanding and is agreeable with the plan.   Radiology Dg Wrist Complete Left  Result Date: 07/11/2017 CLINICAL DATA:  Left wrist pain after falling down stairs at home today. EXAM: LEFT WRIST - COMPLETE 3+ VIEW COMPARISON:  None. FINDINGS: Comminuted, impacted fracture of the distal radial metaphysis and epiphysis with radial and dorsal displacement and dorsal angulation of the distal fragment. There is also a mildly displaced ulnar styloid fracture. IMPRESSION: Distal radius and ulnar styloid fractures. Electronically Signed   By: Beckie Salts M.D.   On: 07/11/2017 19:23   Procedures Procedures   Medications Ordered in ED  Medications  ketorolac (TORADOL) injection 30 mg (30 mg Intramuscular Given 07/11/17 2353)    Splinted immediately by ortho tech  Final Clinical Impressions(s) / ED Diagnoses  This is a 76 y.o.  -year-old female presents with left wrist pain s/p fall. The patient is nontoxic-appearing on exam and vital signs are within normal limits.    XR with distal radius and ulnar styloid fx. No other signs of bodily injury. No LOC or head injury. Pain managed in the ED w/ Toradol. Has Ultram at home for continued pain management. Advised for RICE protocol at home and prompt f/u Dr  Gramig.    After history, exam, and medical workup I feel the patient has been appropriately medically screened and is safe for discharge home. Pertinent diagnoses were discussed with the patient. Patient was given return precautions.  I personally performed the services described in this documentation, which was scribed in my presence. The recorded information has been reviewed and is accurate.        Haylin Camilli, MD 07/12/17 1610

## 2017-07-11 NOTE — Telephone Encounter (Signed)
Patient Name: Natasha Woodard  DOB: [birth year]    Initial Comment Caller states she fell on her wrist.   Nurse Assessment  Nurse: Alvera Singh, RN, Bonita Quin Date/Time (Eastern Time): 07/11/2017 5:02:07 PM  Confirm and document reason for call. If symptomatic, describe symptoms. ---Caller says she fell on her left wrist about 15 min ago. Wrist is swollen and twisted.  Does the patient have any new or worsening symptoms? ---Yes  Will a triage be completed? ---Yes  Related visit to physician within the last 2 weeks? ---No  Does the PT have any chronic conditions? (i.e. diabetes, asthma, etc.) ---Yes  List chronic conditions. ---DM H HTN Heart problems  Is this a behavioral health or substance abuse call? ---No     Guidelines    Guideline Title Affirmed Question Affirmed Notes  Hand and Wrist Injury Looks like a dislocated joint (e.g., crooked or deformed)    Final Disposition User   Go to ED Now Alvera Singh, RN, [facility]    Referrals  [facility] - ED   Disagree/Comply: Comply

## 2017-07-11 NOTE — ED Triage Notes (Signed)
Patient states she was coming down the steps at home and missed the first step. Patient states she started to fall and fell on her left wrist. Patient has pain to the left wrist.

## 2017-07-12 ENCOUNTER — Encounter (HOSPITAL_COMMUNITY): Payer: Self-pay | Admitting: Emergency Medicine

## 2017-07-12 DIAGNOSIS — S83241D Other tear of medial meniscus, current injury, right knee, subsequent encounter: Secondary | ICD-10-CM | POA: Diagnosis not present

## 2017-07-12 MED ORDER — MELOXICAM 15 MG PO TABS: 15.0000 mg | ORAL_TABLET | Freq: Every day | ORAL | 0 refills | Status: DC

## 2017-07-12 NOTE — Telephone Encounter (Signed)
Per chart review tab pt went to [facility] ED on 07/11/17.

## 2017-07-12 NOTE — Telephone Encounter (Signed)
Reviewed note from emergency department. She did fracture her wrist and will follow up with orthopedics in the outpatient setting.

## 2017-07-12 NOTE — ED Notes (Signed)
Meal given

## 2017-07-14 ENCOUNTER — Ambulatory Visit (INDEPENDENT_AMBULATORY_CARE_PROVIDER_SITE_OTHER): Payer: Medicare Other | Admitting: Physician Assistant

## 2017-07-14 ENCOUNTER — Other Ambulatory Visit: Payer: Self-pay | Admitting: *Deleted

## 2017-07-14 ENCOUNTER — Encounter: Payer: Self-pay | Admitting: Physician Assistant

## 2017-07-14 VITALS — BP 140/83 | HR 89 | Ht 62.0 in | Wt 178.8 lb

## 2017-07-14 DIAGNOSIS — I493 Ventricular premature depolarization: Secondary | ICD-10-CM | POA: Diagnosis not present

## 2017-07-14 DIAGNOSIS — E785 Hyperlipidemia, unspecified: Secondary | ICD-10-CM | POA: Diagnosis not present

## 2017-07-14 DIAGNOSIS — I428 Other cardiomyopathies: Secondary | ICD-10-CM

## 2017-07-14 DIAGNOSIS — Z79899 Other long term (current) drug therapy: Secondary | ICD-10-CM

## 2017-07-14 DIAGNOSIS — I10 Essential (primary) hypertension: Secondary | ICD-10-CM

## 2017-07-14 DIAGNOSIS — I2510 Atherosclerotic heart disease of native coronary artery without angina pectoris: Secondary | ICD-10-CM | POA: Diagnosis not present

## 2017-07-14 DIAGNOSIS — E119 Type 2 diabetes mellitus without complications: Secondary | ICD-10-CM | POA: Diagnosis not present

## 2017-07-14 MED ORDER — CARVEDILOL 6.25 MG PO TABS: 6.2500 mg | ORAL_TABLET | Freq: Two times a day (BID) | ORAL | 5 refills | Status: DC

## 2017-07-14 MED ORDER — LOVASTATIN 20 MG PO TABS: 20.0000 mg | ORAL_TABLET | Freq: Every day | ORAL | 5 refills | Status: DC

## 2017-07-14 MED ORDER — SPIRONOLACTONE 25 MG PO TABS: 12.5000 mg | ORAL_TABLET | Freq: Every day | ORAL | 5 refills | Status: DC

## 2017-07-14 NOTE — Patient Instructions (Addendum)
Medication Instructions:   START spironolactone 12.5mg  DAILY (1/2 of a 25mg  tablet)  START lovastatin 20mg  DAILY  INCREASE carvedilol to 6.25mg  TWICE A DAY  STOP amlodipine  Labwork:   BMET today BMET in 1 week Fasting lipid panel & liver function test in 6 to 8 weeks  Testing/Procedures:  none  Follow-Up:  As scheduled with Dr. Herbie Baltimore    If you need a refill on your cardiac medications before your next appointment, please call your pharmacy.

## 2017-07-14 NOTE — Progress Notes (Signed)
Cardiology Office Note    Date:  07/15/2017   ID:  Natasha Woodard, Natasha Woodard 1941-10-16, MRN [id number]  PCP:  [name], MD  Cardiologist:  Dr. Herbie Baltimore  Chief Complaint  Patient presents with  . Follow-up    F/U post cath. Seen for Dr. Herbie Baltimore    History of Present Illness:  Natasha SCHROEPFER is a 76 y.o. female with PMH of DM II, HLD intolerant to statins, HTN, and obesity. On the most recent visit, it was noticed herheart rate was in the high 40s. EKG showed PVCs. She was referred to cardiology service for further evaluation. She denies any chest pain or shortness of breath. She denies any low extremity edema, orthopnea or PND. On physical exam, she does have occasional skipped heartbeat. EKG showed she has both PVCs and PACs. The PVCs are generally benign and may not need treatment unless become more frequent.  I saw the patient initially on 06/09/2017, because of the PVCs, we obtained an echocardiogram which came back severely abnormal showing EF 25-30%. Her 24-hour Holter monitor obtained on 06/22/2017 showed 16954 ventricular ectopy. There were 16056 single or 2, 380 couplets, 44 runs (longest run 7 beats), 8490 supraventricular beats mostly singlet with 11 runs and longest run 15 beats. I brought the patient back on 06/27/2017, we discussed various options, eventually we decided to proceed with left and right cardiac catheterization. She eventually underwent the planned procedure on 06/30/2017 which showed 55 proximal RCA, 40% proximal left circumflex, 40% OM1 lesion, 40% mid left circumflex lesion, 50% ostial D2 lesion, EF 25-35%. Moderately elevated LVEDP, wedge pressure 16 mmHg. LVEDP 26 mmHg. Post cath, she did see some floaters, this was eventually resolved. She returned to the ED on 07/11/2017 with a left wrist fracture after falling down several steps.  She presents today for cardiology office follow-up after recent cardiac catheterization. She denies any lower extremity edema,  orthopnea or PND episodes. We had a long discussion including the patient, her husband and me regarding treatment of nonischemic cardiomyopathy. Previous 24-hour Holter monitor also showed she has very frequent PVCs, unclear if this is PVC-induced cardiomyopathy or not. However I will continue to up titrate the beta blocker. I have increased her carvedilol to 6.25 mg twice a day. I will discontinue her amlodipine. She is currently on enalapril. I have also added spironolactone 12.5 mg daily. I will obtain a basic metabolic panel today and also in one week. She has follow-up with Dr. Herbie Baltimore next month, if possible, her carvedilol should be further up titrated before ordering a 3 month repeat echocardiogram.    Past Medical History:  Diagnosis Date  . Calcific tendonitis 2008   Treatment of left leg  . CTS (carpal tunnel syndrome)    worse on right hand/ wears wrist splint  . Diabetes mellitus    Type II  . Hiatal hernia    with reflux  . Hyperlipidemia    Statin intolerant  . Hypertension   . Ichthyosis congenita   . NSVD (normal spontaneous vaginal delivery) 1971 & 1972  . Obesity   . Plantar fasciitis    Left  . Pulmonary nodule    Imaged multiple times and benign appearing  . Wears glasses     Past Surgical History:  Procedure Laterality Date  . ABDOMINAL HYSTERECTOMY  1975   TAH-BSO  . ABSCESS DRAINAGE  1972   right breast  . BREAST BIOPSY Left 01/14/2009   Stereo- Benign  . CARPAL TUNNEL RELEASE  Right 07/02/2014   Procedure: RIGHT CARPAL TUNNEL RELEASE;  Surgeon: Nicki Reaper, MD;  Location: [facility];  Service: Orthopedics;  Laterality: Right;  . CARPAL TUNNEL RELEASE Left 06/11/2015   Procedure: LEFT CARPAL TUNNEL RELEASE;  Surgeon: Cindee Salt, MD;  Location: [facility];  Service: Orthopedics;  Laterality: Left;  REGIONAL/FAB  . COLONOSCOPY    . corn removal  1968   right  . RIGHT/LEFT HEART CATH AND CORONARY ANGIOGRAPHY N/A 06/30/2017    Procedure: Right/Left Heart Cath and Coronary Angiography;  Surgeon: Marykay Lex, MD;  Location: [facility] INVASIVE CV LAB;  Service: Cardiovascular;  Laterality: N/A;  . ROTATOR CUFF REPAIR     left shoulder    Current Medications: Outpatient Medications Prior to Visit  Medication Sig Dispense Refill  . aspirin 81 MG tablet Take 81 mg by mouth daily.      . Cholecalciferol (VITAMIN D) 1000 UNITS capsule Take 1,000 Units by mouth daily.     . enalapril (VASOTEC) 20 MG tablet TAKE ONE TABLET BY MOUTH ONCE DAILY (Patient taking differently: Take 20 mg by mouth daily. ) 90 tablet 3  . glucose blood (ACCU-CHEK AVIVA PLUS) test strip USE TO TEST BLOOD SUGAR ONCE DAILY FOR DM 250.00 100 each 3  . Lancets (ACCU-CHEK MULTICLIX) lancets USE ONE LANCET TO TEST BLOOD GLUCOSE DAILY FOR DM 250.00 102 each 3  . metFORMIN (GLUCOPHAGE) 500 MG tablet Take 0.5 tablets (250 mg total) by mouth 2 (two) times daily with a meal. 90 tablet 3  . Multiple Vitamin (MULTIVITAMIN WITH MINERALS) TABS tablet Take 1 tablet by mouth daily.    . traMADol (ULTRAM) 50 MG tablet Take 1 tablet (50 mg total) by mouth every 12 (twelve) hours as needed. (Patient taking differently: Take 50 mg by mouth 2 (two) times daily as needed for moderate pain. ) 180 tablet 1  . amLODipine (NORVASC) 5 MG tablet TAKE ONE TABLET BY MOUTH EVERY DAY (Patient taking differently: Take 5 mg by mouth daily. ) 90 tablet 3  . carvedilol (COREG) 3.125 MG tablet Take 1 tablet (3.125 mg total) by mouth 2 (two) times daily. 60 tablet 3  . meloxicam (MOBIC) 15 MG tablet Take 1 tablet (15 mg total) by mouth daily. 10 tablet 0   No facility-administered medications prior to visit.      Allergies:   Ezetimibe-simvastatin; Lipitor [atorvastatin]; Pravastatin sodium; and Sulfonamide derivatives   Social History   Social History  . Marital status: Married    Spouse name: N/A  . Number of children: 2  . Years of education: N/A   Occupational History  .   Retired   Social History Main Topics  . Smoking status: Former Smoker    Years: 28.00    Types: Cigarettes    Quit date: 12/12/1993  . Smokeless tobacco: Never Used  . Alcohol use No  . Drug use: No  . Sexual activity: No   Other Topics Concern  . None   Social History Narrative   Two kids, two grandchildren.    Married 1969   Retired.      Family History:  The patient's family history includes Cancer in her brother; Diabetes in her father and mother; Heart disease in her brother, father, and mother.   ROS:   Please see the history of present illness.    ROS All other systems reviewed and are negative.   PHYSICAL EXAM:   VS:  BP 140/83   Pulse 89  Ht 5\' 2"  (1.575 m)   Wt 178 lb 12.8 oz (81.1 kg)   BMI 32.70 kg/m    GEN: Well nourished, well developed, in no acute distress  HEENT: normal  Neck: no JVD, carotid bruits, or masses Cardiac: RRR; no murmurs, rubs, or gallops,no edema  Respiratory:  clear to auscultation bilaterally, normal work of breathing GI: soft, nontender, nondistended, + BS MS: no deformity or atrophy  Skin: warm and dry, no rash Neuro:  Alert and Oriented x 3, Strength and sensation are intact Psych: euthymic mood, full affect  Wt Readings from Last 3 Encounters:  07/14/17 178 lb 12.8 oz (81.1 kg)  07/11/17 178 lb (80.7 kg)  06/30/17 175 lb (79.4 kg)      Studies/Labs Reviewed:   EKG:  EKG is not ordered today.   Recent Labs: 05/30/2017: ALT 10; TSH 0.74 06/27/2017: Hemoglobin 14.6; Platelets 414 07/14/2017: BUN 13; Creatinine, Ser 0.83; Potassium 5.0; Sodium 138   Lipid Panel    Component Value Date/Time   CHOL 183 09/15/2016 1253   TRIG 129.0 09/15/2016 1253   HDL 39.30 09/15/2016 1253   CHOLHDL 5 09/15/2016 1253   VLDL 25.8 09/15/2016 1253   LDLCALC 118 (H) 09/15/2016 1253   LDLDIRECT 53 08/10/2009    Additional studies/ records that were reviewed today include:   24 hour Holter monitor 06/22/2017 Study Highlights      Mostly sinus rhythm with sinus bradycardia to sinus tachycardia.  Minimum heart rate 45 bpm, maximum heart rate 135 BPM. Average heart rate 85 BPM.  13% ventricular ectopy/PVCs: The majority are singlets. Rare couplets and runs. - Fasted from was 172 bpm. Longest run 7 beats.  6-7% supraventricular ectopy. Majority are singlets. Some pairs and bigeminy. 11 runs -fastest run 166 BPM. Longest run 15 beats.  Significant ventricular and supraventricular ectopy with short nonsustained VT runs as well as PSVT/PAT runs.    Echo 06/22/2017 LV EF: 25% -   30%  Study Conclusions  - Left ventricle: The cavity size was mildly dilated. Wall   thickness was normal. Systolic function was severely reduced. The   estimated ejection fraction was in the range of 25% to 30%.   Diffuse hypokinesis. Doppler parameters are consistent with   abnormal left ventricular relaxation (grade 1 diastolic   dysfunction). Doppler parameters are consistent with high   ventricular filling pressure. - Mitral valve: There was mild regurgitation. - Left atrium: The atrium was mildly dilated. - Pulmonary arteries: Systolic pressure was moderately increased.  Impressions:  - Severe global reduction in LV systolic function; mild diastolic   dysfunction with elevated LV filling pressure; mild LVE; mild MR;   mild LAE; mild TR with moderately elevated pulmonary pressure.    Cath 06/30/2017 Conclusion     Prox RCA lesion, 55 %stenosed.  Prox Cx lesion, 40 %stenosed. 1st Mrg lesion, 45 %stenosed. Mid Cx lesion, 50 %stenosed.  Ost 2nd Diag lesion, 50 %stenosed.  There is severe left ventricular systolic dysfunction. The left ventricular ejection fraction is 25-35% by visual estimate.  LV end diastolic pressure is moderately elevated.  Hemodynamic findings consistent with mild pulmonary hypertension.   Mild to moderate multivessel CAD with no obstructive lesions to explain severely reduced EF.  This  was suggest nonischemic cardiomyopathy moderate LV filling pressure elevation, but relatively normal right heart pressures. She seems to be relatively euvolemic, but would benefit from afterload reduction and standing oral diuretic.  Plan: We will optimize medical management in the outpatient setting. Patient will  be discharged home from postprocedure unit when stable.    Left wrist X ray 07/11/2017 FINDINGS: Comminuted, impacted fracture of the distal radial metaphysis and epiphysis with radial and dorsal displacement and dorsal angulation of the distal fragment. There is also a mildly displaced ulnar styloid fracture.  IMPRESSION: Distal radius and ulnar styloid fractures.     ASSESSMENT:    1. NICM (nonischemic cardiomyopathy) (HCC)   2. Encounter for long-term (current) use of medications   3. Hyperlipidemia, unspecified hyperlipidemia type   4. Controlled type 2 diabetes mellitus without complication, without long-term current use of insulin (HCC)   5. Essential hypertension   6. Frequent PVCs   7. Coronary artery disease involving native coronary artery of native heart without angina pectoris      PLAN:  In order of problems listed above:  1. Nonischemic cardiomyopathy: Echocardiogram showed EF 25%, recent left and right heart cath showed elevated LVEDP, nonobstructive CAD. Unclear if the very frequent PVC contributed to her nonischemic cardiomyopathy. She is currently on carvedilol, enalapril and amlodipine. We'll maximize her heart failure medication. Discontinue amlodipine, increase carvedilol to 6.25 mg twice a day for further suppression of PVCs. Add spironolactone 12.5 mg daily. Obtained a metabolic panel today and also in one week to assess electrolyte and renal function.  2. Nonobstructive CAD: Seen on recent cath, she has a history of statin intolerance, however she is willing to try lovastatin 20 medical daily. We will obtain 6 weeks repeat fasting lipid panel  and LFT, if continued to be elevated, she will need to be referred to the lipid clinic  3. Frequent PVCs: She has more than then 10,000 PVCs in a single day on recent monitor. I have increased carvedilol to 6.25 mg twice a day.  4. Hypertension: Blood pressure medication adjustment as above mainly for LV dysfunction. Amlodipine has been discontinued in favor of increasing carvedilol and add low-dose spironolactone.  5. Hyperlipidemia: She has history of statin intolerance. She has tried Lipitor, simvastatin, and Pravachol in the past. We will try lovastatin 20 mg daily. If she is able to tolerate this, she will need a fasting lipid panel and LFTs in 6-8 weeks. If cholesterol continued to be high, she will be referred to the lipid clinic.    6. DM 2: We will defer to primary care provider.   Medication Adjustments/Labs and Tests Ordered: Current medicines are reviewed at length with the patient today.  Concerns regarding medicines are outlined above.  Medication changes, Labs and Tests ordered today are listed in the Patient Instructions below. Patient Instructions  Medication Instructions:   START spironolactone 12.5mg  DAILY (1/2 of a 25mg  tablet)  START lovastatin 20mg  DAILY  INCREASE carvedilol to 6.25mg  TWICE A DAY  STOP amlodipine  Labwork:   BMET today BMET in 1 week Fasting lipid panel & liver function test in 6 to 8 weeks  Testing/Procedures:  none  Follow-Up:  As scheduled with Dr. Herbie Baltimore    If you need a refill on your cardiac medications before your next appointment, please call your pharmacy.      Ramond Dial, [place]  07/15/2017 11:30 PM    [facility] [address] [place], [place], [place]  [address] Phone: [phone]; Fax: [phone]

## 2017-07-15 ENCOUNTER — Encounter: Payer: Self-pay | Admitting: Physician Assistant

## 2017-07-15 LAB — BASIC METABOLIC PANEL
BUN / CREAT RATIO: 16 (ref 12–28)
BUN: 13 mg/dL (ref 8–27)
CALCIUM: 10.1 mg/dL (ref 8.7–10.3)
CHLORIDE: 98 mmol/L (ref 96–106)
CO2: 23 mmol/L (ref 20–29)
CREATININE: 0.83 mg/dL (ref 0.57–1.00)
GFR calc non Af Amer: 69 mL/min/{1.73_m2} (ref >59)
GFR, EST AFRICAN AMERICAN: 79 mL/min/{1.73_m2} (ref >59)
GLUCOSE: 91 mg/dL (ref 65–99)
Potassium: 5 mmol/L (ref 3.5–5.2)
SODIUM: 138 mmol/L (ref 134–144)

## 2017-07-19 ENCOUNTER — Telehealth: Payer: Self-pay | Admitting: Cardiology

## 2017-07-19 ENCOUNTER — Encounter: Payer: Self-pay | Admitting: Physician Assistant

## 2017-07-19 NOTE — Telephone Encounter (Signed)
New message    Patient calling - can blood work wait until Monday

## 2017-07-20 ENCOUNTER — Encounter (HOSPITAL_COMMUNITY)
Admission: RE | Admit: 2017-07-20 | Discharge: 2017-07-20 | Disposition: A | Discharge status: Home / Self Care | Payer: Medicare Other | Source: Ambulatory Visit | Attending: Orthopedic Surgery | Admitting: Orthopedic Surgery

## 2017-07-20 ENCOUNTER — Encounter (HOSPITAL_COMMUNITY): Payer: Self-pay

## 2017-07-20 DIAGNOSIS — I2510 Atherosclerotic heart disease of native coronary artery without angina pectoris: Secondary | ICD-10-CM | POA: Diagnosis not present

## 2017-07-20 DIAGNOSIS — S52592A Other fractures of lower end of left radius, initial encounter for closed fracture: Secondary | ICD-10-CM | POA: Diagnosis not present

## 2017-07-20 DIAGNOSIS — Z882 Allergy status to sulfonamides status: Secondary | ICD-10-CM | POA: Diagnosis not present

## 2017-07-20 DIAGNOSIS — W19XXXA Unspecified fall, initial encounter: Secondary | ICD-10-CM | POA: Diagnosis not present

## 2017-07-20 DIAGNOSIS — J449 Chronic obstructive pulmonary disease, unspecified: Secondary | ICD-10-CM | POA: Diagnosis not present

## 2017-07-20 DIAGNOSIS — I10 Essential (primary) hypertension: Secondary | ICD-10-CM | POA: Diagnosis not present

## 2017-07-20 DIAGNOSIS — E669 Obesity, unspecified: Secondary | ICD-10-CM | POA: Diagnosis not present

## 2017-07-20 DIAGNOSIS — Z7982 Long term (current) use of aspirin: Secondary | ICD-10-CM | POA: Diagnosis not present

## 2017-07-20 DIAGNOSIS — I429 Cardiomyopathy, unspecified: Secondary | ICD-10-CM | POA: Diagnosis not present

## 2017-07-20 DIAGNOSIS — E785 Hyperlipidemia, unspecified: Secondary | ICD-10-CM | POA: Diagnosis not present

## 2017-07-20 DIAGNOSIS — Z87891 Personal history of nicotine dependence: Secondary | ICD-10-CM | POA: Diagnosis not present

## 2017-07-20 DIAGNOSIS — E1151 Type 2 diabetes mellitus with diabetic peripheral angiopathy without gangrene: Secondary | ICD-10-CM | POA: Diagnosis not present

## 2017-07-20 DIAGNOSIS — Z6832 Body mass index (BMI) 32.0-32.9, adult: Secondary | ICD-10-CM | POA: Diagnosis not present

## 2017-07-20 DIAGNOSIS — Z888 Allergy status to other drugs, medicaments and biological substances status: Secondary | ICD-10-CM | POA: Diagnosis not present

## 2017-07-20 DIAGNOSIS — Z7984 Long term (current) use of oral hypoglycemic drugs: Secondary | ICD-10-CM | POA: Diagnosis not present

## 2017-07-20 DIAGNOSIS — K219 Gastro-esophageal reflux disease without esophagitis: Secondary | ICD-10-CM | POA: Diagnosis not present

## 2017-07-20 DIAGNOSIS — Z79899 Other long term (current) drug therapy: Secondary | ICD-10-CM | POA: Diagnosis not present

## 2017-07-20 HISTORY — DX: Cardiomyopathy, unspecified: I42.9

## 2017-07-20 HISTORY — DX: Cardiac arrhythmia, unspecified: I49.9

## 2017-07-20 LAB — CBC
HEMATOCRIT: 40.8 % (ref 36.0–46.0)
HEMOGLOBIN: 13.7 g/dL (ref 12.0–15.0)
MCH: 29.4 pg (ref 26.0–34.0)
MCHC: 33.6 g/dL (ref 30.0–36.0)
MCV: 87.6 fL (ref 78.0–100.0)
Platelets: 406 10*3/uL — ABNORMAL HIGH (ref 150–400)
RBC: 4.66 MIL/uL (ref 3.87–5.11)
RDW: 13.5 % (ref 11.5–15.5)
WBC: 5.1 10*3/uL (ref 4.0–10.5)

## 2017-07-20 LAB — BASIC METABOLIC PANEL
ANION GAP: 9 (ref 5–15)
BUN: 17 mg/dL (ref 6–20)
CALCIUM: 9.6 mg/dL (ref 8.9–10.3)
CHLORIDE: 104 mmol/L (ref 101–111)
CO2: 25 mmol/L (ref 22–32)
Creatinine, Ser: 0.88 mg/dL (ref 0.44–1.00)
GFR calc non Af Amer: >60 mL/min (ref >60)
GLUCOSE: 90 mg/dL (ref 65–99)
Potassium: 6.8 mmol/L (ref 3.5–5.1)
Sodium: 138 mmol/L (ref 135–145)

## 2017-07-20 LAB — GLUCOSE, CAPILLARY: Glucose-Capillary: 106 mg/dL — ABNORMAL HIGH (ref 65–99)

## 2017-07-20 LAB — SURGICAL PCR SCREEN
MRSA, PCR: NEGATIVE
STAPHYLOCOCCUS AUREUS: POSITIVE — AB

## 2017-07-20 NOTE — Progress Notes (Signed)
Anesthesia Chart Review: Patient is a 76 year old female scheduled for ORIF left distal radial fracture on 07/21/17 by Dr. Margarita Rana.  History includes former smoker, HTN, HLD, DM2, hiatal hernia, CAD (mild-moderate) 06/2017, cardiomyopathy (non-ischemic), dysrhythmia (bradycardia, PVCs, PACs), pulmonary nodule (RUL, felt benign by imaging '10), hysterectomy. BMI is consistent with obesity.   - PCP is Dr. Crawford Givens.  - Cardiologist is Dr. Bryan Lemma. Last visti with Azalee Course, PA on 07/14/17. Question if frequent PVCs were contributing to cause on non-ischemic CM. She had > 10,000 PVCs in a single day on recent monitor. B-blocker dose increased. She has scheduled follow-up with Dr. Herbie Baltimore on 09/08/17.  Meds include aspirin 81 mg (on hold), Coreg, enalapril, lovastatin, metformin, Aldactone, tramadol.  BP 138/78   Pulse 84   Temp 36.8 C   Resp 20   Ht 5\' 2"  (1.575 m)   Wt 176 lb 4.8 oz (80 kg)   SpO2 97%   BMI 32.25 kg/m   EKG 06/09/17: NSR, PACs, and PVCs.  Cardiac cath 06/30/17:  Prox RCA lesion, 55 %stenosed.  Prox Cx lesion, 40 %stenosed. 1st Mrg lesion, 45 %stenosed. Mid Cx lesion, 50 %stenosed.  Ost 2nd Diag lesion, 50 %stenosed.  There is severe left ventricular systolic dysfunction. The left ventricular ejection fraction is 25-35% by visual estimate.  LV end diastolic pressure is moderately elevated.  Hemodynamic findings consistent with mild pulmonary hypertension.  Mild to moderate multivessel CAD with no obstructive lesions to explain severely reduced EF.  This was suggest nonischemic cardiomyopathy moderate LV filling pressure elevation, but relatively normal right heart pressures. She seems to be relatively euvolemic, but would benefit from afterload reduction and standing oral diuretic. Plan: We will optimize medical management in the outpatient setting.  Echo 06/22/17: Study Conclusions - Left ventricle: The cavity size was mildly dilated. Wall   thickness  was normal. Systolic function was severely reduced. The   estimated ejection fraction was in the range of 25% to 30%.   Diffuse hypokinesis. Doppler parameters are consistent with   abnormal left ventricular relaxation (grade 1 diastolic   dysfunction). Doppler parameters are consistent with high   ventricular filling pressure. - Mitral valve: There was mild regurgitation. - Left atrium: The atrium was mildly dilated. - Pulmonary arteries: Systolic pressure was moderately increased. Impressions: - Severe global reduction in LV systolic function; mild diastolic   dysfunction with elevated LV filling pressure; mild LVE; mild MR;   mild LAE; mild TR with moderately elevated pulmonary pressure.  24 hour Holter monitor 06/22/17:  Mostly sinus rhythm with sinus bradycardia to sinus tachycardia.  Minimum heart rate 45 bpm, maximum heart rate 135 BPM. Average heart rate 85 BPM.  13% ventricular ectopy/PVCs: The majority are singlets. Rare couplets and runs. - Fasted from was 172 bpm. Longest run 7 beats.  6-7% supraventricular ectopy. Majority are singlets. Some pairs and bigeminy. 11 runs -fastest run 166 BPM. Longest run 15 beats.  Significant ventricular and supraventricular ectopy with short nonsustained VT runs as well as PSVT/PAT runs.  Preoperative labs noted. K 6.8 (marked hemolysis). BUN/Cr 17/0.88. H/H 13.7/40.8. PLT 406. Results called to Dr. Greig Right office with request for patient to come in early tomorrow for repeat K.   We will have patient come in 2 1/2 hours to repeat STAT BMET. Historically, her potassium has run on the higher end of normal (on spironolactone), but her renal function is normal so suspect significantly elevated potassium is due to hemolysis. Spironolactone, metformin, enalapril  should all be held on the morning or surgery. If potassium remains elevated could consider EKG and further evaluation/treatment of hyperkalemia prior to surgery, but if repeat K result  acceptable then would anticipate that she can proceed as planned if otherwise no acute changes or acute CV/CHF symptoms. Above reviewed with anesthesiologist Dr. Renold Don.  Velna Ochs [facility] Short Stay Center/Anesthesiology Phone [phone] 07/20/2017 5:20 PM

## 2017-07-20 NOTE — Pre-Procedure Instructions (Signed)
Natasha Woodard  07/20/2017      [id number] - [address] ([address]), [place] - [address] [place] ([place]) [place] [address] Phone: [phone] Fax: [phone]    Your procedure is scheduled on August 10  Report to [facility] Admitting at 1200 PM.  Call this number if you have problems the morning of surgery:  [phone]   Remember:  Do not eat food or drink liquids after midnight.  Continue all other medications as directed by your physician except follow these instructions about you medications   Take these medicines the morning of surgery with A SIP OF WATER carvedilol (COREG),  traMADol (ULTRAM) if needed  7 days prior to surgery STOP taking any Aleve, Naproxen, Ibuprofen, Motrin, Advil, Goody's, BC's, all herbal medications, fish oil, and all vitamins  Follow your doctors instructions regarding your Aspirin.  If no instructions were given by the doctor you will need to call the office to get instructions.  Your pre admission RN will also call for those instructions  WHAT DO I DO ABOUT MY DIABETES MEDICATION?   Marland Kitchen Do not take oral diabetes medicines (pills) the morning of surgery. metFORMIN (GLUCOPHAGE)   How to Manage Your Diabetes Before and After Surgery  Why is it important to control my blood sugar before and after surgery? . Improving blood sugar levels before and after surgery helps healing and can limit problems. . A way of improving blood sugar control is eating a healthy diet by: o  Eating less sugar and carbohydrates o  Increasing activity/exercise o  Talking with your doctor about reaching your blood sugar goals . High blood sugars (greater than 180 mg/dL) can raise your risk of infections and slow your recovery, so you will need to focus on controlling your diabetes during the weeks before surgery. . Make sure that the doctor who takes care of your diabetes knows about your planned surgery including the date and  location.  How do I manage my blood sugar before surgery? . Check your blood sugar at least 4 times a day, starting 2 days before surgery, to make sure that the level is not too high or low. o Check your blood sugar the morning of your surgery when you wake up and every 2 hours until you get to the Short Stay unit. . If your blood sugar is less than 70 mg/dL, you will need to treat for low blood sugar: o Do not take insulin. o Treat a low blood sugar (less than 70 mg/dL) with  cup of clear juice (cranberry or apple), 4 glucose tablets, OR glucose gel. o Recheck blood sugar in 15 minutes after treatment (to make sure it is greater than 70 mg/dL). If your blood sugar is not greater than 70 mg/dL on recheck, call [phone] for further instructions. . Report your blood sugar to the short stay nurse when you get to Short Stay.  . If you are admitted to the hospital after surgery: o Your blood sugar will be checked by the staff and you will probably be given insulin after surgery (instead of oral diabetes medicines) to make sure you have good blood sugar levels. o The goal for blood sugar control after surgery is 80-180 mg/dL.    Do not wear jewelry, make-up or nail polish.  Do not wear lotions, powders, or perfumes, or deoderant.  Do not shave 48 hours prior to surgery.  Men may shave face and neck.  Do not bring valuables to the hospital.  [facility] is not responsible for any belongings or valuables.  Contacts, dentures or bridgework may not be worn into surgery.  Leave your suitcase in the car.  After surgery it may be brought to your room.  For patients admitted to the hospital, discharge time will be determined by your treatment team.  Patients discharged the day of surgery will not be allowed to drive home.    Special instructions:   [facility]- Preparing For Surgery  Before surgery, you can play an important role. Because skin is not sterile, your skin needs to be as free of  germs as possible. You can reduce the number of germs on your skin by washing with CHG (chlorahexidine gluconate) Soap before surgery.  CHG is an antiseptic cleaner which kills germs and bonds with the skin to continue killing germs even after washing.  Please do not use if you have an allergy to CHG or antibacterial soaps. If your skin becomes reddened/irritated stop using the CHG.  Do not shave (including legs and underarms) for at least 48 hours prior to first CHG shower. It is OK to shave your face.  Please follow these instructions carefully.   1. Shower the NIGHT BEFORE SURGERY and the MORNING OF SURGERY with CHG.   2. If you chose to wash your hair, wash your hair first as usual with your normal shampoo.  3. After you shampoo, rinse your hair and body thoroughly to remove the shampoo.  4. Use CHG as you would any other liquid soap. You can apply CHG directly to the skin and wash gently with a scrungie or a clean washcloth.   5. Apply the CHG Soap to your body ONLY FROM THE NECK DOWN.  Do not use on open wounds or open sores. Avoid contact with your eyes, ears, mouth and genitals (private parts). Wash genitals (private parts) with your normal soap.  6. Wash thoroughly, paying special attention to the area where your surgery will be performed.  7. Thoroughly rinse your body with warm water from the neck down.  8. DO NOT shower/wash with your normal soap after using and rinsing off the CHG Soap.  9. Pat yourself dry with a CLEAN TOWEL.   10. Wear CLEAN PAJAMAS   11. Place CLEAN SHEETS on your bed the night of your first shower and DO NOT SLEEP WITH PETS.    Day of Surgery: Do not apply any deodorants/lotions. Please wear clean clothes to the hospital/surgery center.      Please read over the following fact sheets that you were given.

## 2017-07-20 NOTE — Progress Notes (Signed)
PCP - Crawford Givens Cardiologist - [place]  Chest x-ray - not needed EKG - 06/09/17 Stress Test - denies ECHO - 06/22/17 Cardiac Cath - 06/30/17  Sleep Study -  CPAP -   Fasting Blood Sugar - 100-109 Checks Blood Sugar _2-3____ times a week     Patient denies shortness of breath, fever, cough and chest pain at PAT appointment   Patient verbalized understanding of instructions that were given to them at the PAT appointment. Patient was also instructed that they will need to review over the PAT instructions again at home before surgery.

## 2017-07-20 NOTE — Progress Notes (Addendum)
Natasha Woodard made aware of hemolyzed k+ 6.8.  Natasha Woodard in main lab suggested recollecting.  Natasha Woodard asked that Natasha Woodard office be called.  Message left at Natasha Woodard office of elevated potassium 6.8 (hemolyzed) and they stated Natasha Woodard would call back. Called and left message on VM with Natasha Woodard of k+ 6.8.

## 2017-07-20 NOTE — Telephone Encounter (Signed)
Looks like pt is having a procedure done tomorrow. Should she have repeat BMET done today or ok to wait until Monday?

## 2017-07-20 NOTE — Telephone Encounter (Signed)
Left detail message with recommendation, ok per DPR, and to call back if any questions.  

## 2017-07-20 NOTE — Telephone Encounter (Signed)
Blood work can wait until Monday

## 2017-07-21 ENCOUNTER — Encounter (HOSPITAL_COMMUNITY)
Admission: RE | Disposition: A | Discharge status: Home / Self Care | Payer: Self-pay | Source: Ambulatory Visit | Attending: Orthopedic Surgery

## 2017-07-21 ENCOUNTER — Ambulatory Visit (HOSPITAL_COMMUNITY): Payer: Medicare Other | Admitting: Vascular Surgery

## 2017-07-21 ENCOUNTER — Ambulatory Visit (HOSPITAL_COMMUNITY): Payer: Medicare Other | Admitting: Anesthesiology

## 2017-07-21 ENCOUNTER — Encounter (HOSPITAL_COMMUNITY): Payer: Self-pay

## 2017-07-21 ENCOUNTER — Ambulatory Visit (HOSPITAL_COMMUNITY)
Admission: RE | Admit: 2017-07-21 | Discharge: 2017-07-21 | Disposition: A | Discharge status: Home / Self Care | Payer: Medicare Other | Source: Ambulatory Visit | Attending: Orthopedic Surgery | Admitting: Orthopedic Surgery

## 2017-07-21 DIAGNOSIS — S52592A Other fractures of lower end of left radius, initial encounter for closed fracture: Secondary | ICD-10-CM | POA: Diagnosis not present

## 2017-07-21 DIAGNOSIS — I2510 Atherosclerotic heart disease of native coronary artery without angina pectoris: Secondary | ICD-10-CM | POA: Diagnosis not present

## 2017-07-21 DIAGNOSIS — K219 Gastro-esophageal reflux disease without esophagitis: Secondary | ICD-10-CM | POA: Diagnosis not present

## 2017-07-21 DIAGNOSIS — E1151 Type 2 diabetes mellitus with diabetic peripheral angiopathy without gangrene: Secondary | ICD-10-CM | POA: Insufficient documentation

## 2017-07-21 DIAGNOSIS — Z87891 Personal history of nicotine dependence: Secondary | ICD-10-CM | POA: Diagnosis not present

## 2017-07-21 DIAGNOSIS — Z6832 Body mass index (BMI) 32.0-32.9, adult: Secondary | ICD-10-CM | POA: Insufficient documentation

## 2017-07-21 DIAGNOSIS — Z888 Allergy status to other drugs, medicaments and biological substances status: Secondary | ICD-10-CM | POA: Insufficient documentation

## 2017-07-21 DIAGNOSIS — J449 Chronic obstructive pulmonary disease, unspecified: Secondary | ICD-10-CM | POA: Diagnosis not present

## 2017-07-21 DIAGNOSIS — I10 Essential (primary) hypertension: Secondary | ICD-10-CM | POA: Diagnosis not present

## 2017-07-21 DIAGNOSIS — I429 Cardiomyopathy, unspecified: Secondary | ICD-10-CM | POA: Diagnosis not present

## 2017-07-21 DIAGNOSIS — Z7982 Long term (current) use of aspirin: Secondary | ICD-10-CM | POA: Insufficient documentation

## 2017-07-21 DIAGNOSIS — S52502A Unspecified fracture of the lower end of left radius, initial encounter for closed fracture: Secondary | ICD-10-CM | POA: Diagnosis not present

## 2017-07-21 DIAGNOSIS — E119 Type 2 diabetes mellitus without complications: Secondary | ICD-10-CM | POA: Diagnosis not present

## 2017-07-21 DIAGNOSIS — Z882 Allergy status to sulfonamides status: Secondary | ICD-10-CM | POA: Diagnosis not present

## 2017-07-21 DIAGNOSIS — W19XXXA Unspecified fall, initial encounter: Secondary | ICD-10-CM | POA: Insufficient documentation

## 2017-07-21 DIAGNOSIS — Z79899 Other long term (current) drug therapy: Secondary | ICD-10-CM | POA: Insufficient documentation

## 2017-07-21 DIAGNOSIS — E785 Hyperlipidemia, unspecified: Secondary | ICD-10-CM | POA: Diagnosis not present

## 2017-07-21 DIAGNOSIS — E669 Obesity, unspecified: Secondary | ICD-10-CM | POA: Insufficient documentation

## 2017-07-21 DIAGNOSIS — Z7984 Long term (current) use of oral hypoglycemic drugs: Secondary | ICD-10-CM | POA: Insufficient documentation

## 2017-07-21 HISTORY — PX: OPEN REDUCTION INTERNAL FIXATION (ORIF) DISTAL RADIAL FRACTURE: SHX5989

## 2017-07-21 LAB — BASIC METABOLIC PANEL
Anion gap: 7 (ref 5–15)
BUN: 13 mg/dL (ref 6–20)
CALCIUM: 9.7 mg/dL (ref 8.9–10.3)
CHLORIDE: 108 mmol/L (ref 101–111)
CO2: 26 mmol/L (ref 22–32)
CREATININE: 0.81 mg/dL (ref 0.44–1.00)
GLUCOSE: 114 mg/dL — AB (ref 65–99)
Potassium: 4.4 mmol/L (ref 3.5–5.1)
Sodium: 141 mmol/L (ref 135–145)

## 2017-07-21 LAB — GLUCOSE, CAPILLARY
GLUCOSE-CAPILLARY: 86 mg/dL (ref 65–99)
Glucose-Capillary: 102 mg/dL — ABNORMAL HIGH (ref 65–99)
Glucose-Capillary: 83 mg/dL (ref 65–99)

## 2017-07-21 SURGERY — OPEN REDUCTION INTERNAL FIXATION (ORIF) DISTAL RADIUS FRACTURE
Anesthesia: Regional | Site: Wrist | Laterality: Left

## 2017-07-21 MED ORDER — FENTANYL CITRATE (PF) 100 MCG/2ML IJ SOLN
INTRAMUSCULAR | Status: DC | PRN
  Administered 2017-07-21: 40 ug via INTRAVENOUS

## 2017-07-21 MED ORDER — PROPOFOL 1000 MG/100ML IV EMUL
INTRAVENOUS | Status: AC
  Filled 2017-07-21: qty 100

## 2017-07-21 MED ORDER — MIDAZOLAM HCL 2 MG/2ML IJ SOLN
2.0000 mg | Freq: Once | INTRAMUSCULAR | Status: AC
Start: 2017-07-21 — End: 2017-07-21
  Administered 2017-07-21: 2 mg via INTRAVENOUS
  Filled 2017-07-21: qty 2

## 2017-07-21 MED ORDER — CHLORHEXIDINE GLUCONATE 4 % EX LIQD: 60.0000 mL | Freq: Once | CUTANEOUS | Status: DC

## 2017-07-21 MED ORDER — FENTANYL CITRATE (PF) 100 MCG/2ML IJ SOLN
100.0000 ug | Freq: Once | INTRAMUSCULAR | Status: AC
  Administered 2017-07-21: 100 ug via INTRAVENOUS
  Filled 2017-07-21: qty 2

## 2017-07-21 MED ORDER — 0.9 % SODIUM CHLORIDE (POUR BTL) OPTIME
TOPICAL | Status: DC | PRN
  Administered 2017-07-21: 1000 mL

## 2017-07-21 MED ORDER — LACTATED RINGERS IV SOLN: INTRAVENOUS | Status: DC

## 2017-07-21 MED ORDER — PROPOFOL 500 MG/50ML IV EMUL: INTRAVENOUS | Status: DC | PRN

## 2017-07-21 MED ORDER — PROPOFOL 500 MG/50ML IV EMUL
INTRAVENOUS | Status: DC | PRN
  Administered 2017-07-21: 75 ug/kg/min via INTRAVENOUS

## 2017-07-21 MED ORDER — LACTATED RINGERS IV SOLN
INTRAVENOUS | Status: DC
  Administered 2017-07-21 (×2): via INTRAVENOUS

## 2017-07-21 MED ORDER — CEFAZOLIN SODIUM-DEXTROSE 2-4 GM/100ML-% IV SOLN
2.0000 g | INTRAVENOUS | Status: AC
  Administered 2017-07-21: 2 g via INTRAVENOUS
  Filled 2017-07-21: qty 100

## 2017-07-21 MED ORDER — BUPIVACAINE-EPINEPHRINE (PF) 0.5% -1:200000 IJ SOLN
INTRAMUSCULAR | Status: DC | PRN
  Administered 2017-07-21: 30 mL via PERINEURAL

## 2017-07-21 MED ORDER — PROPOFOL 10 MG/ML IV BOLUS
INTRAVENOUS | Status: AC
  Filled 2017-07-21: qty 20

## 2017-07-21 SURGICAL SUPPLY — 66 items
APL SKNCLS STERI-STRIP NONHPOA (GAUZE/BANDAGES/DRESSINGS) ×1
BANDAGE ACE 3X5.8 VEL STRL LF (GAUZE/BANDAGES/DRESSINGS) ×3 IMPLANT
BANDAGE ACE 4X5 VEL STRL LF (GAUZE/BANDAGES/DRESSINGS) ×3 IMPLANT
BANDAGE ELASTIC 3 VELCRO ST LF (GAUZE/BANDAGES/DRESSINGS) ×2 IMPLANT
BENZOIN TINCTURE PRP APPL 2/3 (GAUZE/BANDAGES/DRESSINGS) ×3 IMPLANT
BIT DRILL 2.2 SS TIBIAL (BIT) ×2 IMPLANT
BLADE CLIPPER SURG (BLADE) IMPLANT
BNDG CMPR 9X4 STRL LF SNTH (GAUZE/BANDAGES/DRESSINGS) ×1
BNDG COHESIVE 4X5 TAN STRL (GAUZE/BANDAGES/DRESSINGS) ×3 IMPLANT
BNDG ESMARK 4X9 LF (GAUZE/BANDAGES/DRESSINGS) ×3 IMPLANT
CLOSURE WOUND 1/2 X4 (GAUZE/BANDAGES/DRESSINGS) ×1
CORDS BIPOLAR (ELECTRODE) ×3 IMPLANT
COVER SURGICAL LIGHT HANDLE (MISCELLANEOUS) ×4 IMPLANT
CUFF TOURNIQUET SINGLE 18IN (TOURNIQUET CUFF) ×3 IMPLANT
CUFF TOURNIQUET SINGLE 24IN (TOURNIQUET CUFF) IMPLANT
DECANTER SPIKE VIAL GLASS SM (MISCELLANEOUS) IMPLANT
DRAPE OEC MINIVIEW 54X84 (DRAPES) ×2 IMPLANT
DRAPE U-SHAPE 47X51 STRL (DRAPES) ×3 IMPLANT
DRSG EMULSION OIL 3X3 NADH (GAUZE/BANDAGES/DRESSINGS) ×2 IMPLANT
ELECT REM PT RETURN 9FT ADLT (ELECTROSURGICAL)
ELECTRODE REM PT RTRN 9FT ADLT (ELECTROSURGICAL) IMPLANT
GAUZE SPONGE 4X4 12PLY STRL (GAUZE/BANDAGES/DRESSINGS) ×3 IMPLANT
GAUZE SPONGE 4X4 12PLY STRL LF (GAUZE/BANDAGES/DRESSINGS) ×2 IMPLANT
GAUZE XEROFORM 1X8 LF (GAUZE/BANDAGES/DRESSINGS) ×3 IMPLANT
GLOVE BIO SURGEON STRL SZ7.5 (GLOVE) ×4 IMPLANT
GLOVE BIOGEL PI IND STRL 8 (GLOVE) ×2 IMPLANT
GLOVE BIOGEL PI INDICATOR 8 (GLOVE) ×2
GOWN STRL REUS W/ TWL LRG LVL3 (GOWN DISPOSABLE) ×2 IMPLANT
GOWN STRL REUS W/ TWL XL LVL3 (GOWN DISPOSABLE) ×1 IMPLANT
GOWN STRL REUS W/TWL LRG LVL3 (GOWN DISPOSABLE) ×6
GOWN STRL REUS W/TWL XL LVL3 (GOWN DISPOSABLE) ×3
K-WIRE 1.6 (WIRE) ×6
K-WIRE FX5X1.6XNS BN SS (WIRE) ×2
KIT BASIN OR (CUSTOM PROCEDURE TRAY) ×3 IMPLANT
KIT ROOM TURNOVER OR (KITS) ×3 IMPLANT
KWIRE FX5X1.6XNS BN SS (WIRE) IMPLANT
MANIFOLD NEPTUNE II (INSTRUMENTS) ×1 IMPLANT
NDL HYPO 25GX1X1/2 BEV (NEEDLE) IMPLANT
NEEDLE HYPO 25GX1X1/2 BEV (NEEDLE) IMPLANT
NS IRRIG 1000ML POUR BTL (IV SOLUTION) ×4 IMPLANT
PACK ORTHO EXTREMITY (CUSTOM PROCEDURE TRAY) ×3 IMPLANT
PAD ARMBOARD 7.5X6 YLW CONV (MISCELLANEOUS) ×6 IMPLANT
PAD CAST 4YDX4 CTTN HI CHSV (CAST SUPPLIES) ×1 IMPLANT
PADDING CAST COTTON 4X4 STRL (CAST SUPPLIES) ×6
PEG LOCKING SMOOTH 2.2X18 (Peg) ×2 IMPLANT
PEG LOCKING SMOOTH 2.2X20 (Screw) ×10 IMPLANT
PEG LOCKING SMOOTH 2.2X22 (Screw) ×2 IMPLANT
PLATE STANDARD DVR LEFT (Plate) ×3 IMPLANT
PLATE STD DVR LT 24X51 (Plate) IMPLANT
SCREW LOCK 16X2.7X 3 LD TPR (Screw) IMPLANT
SCREW LOCKING 2.7X15MM (Screw) ×4 IMPLANT
SCREW LOCKING 2.7X16 (Screw) ×3 IMPLANT
SPLINT PLASTER CAST XFAST 5X30 (CAST SUPPLIES) IMPLANT
SPLINT PLASTER XFAST SET 5X30 (CAST SUPPLIES) ×2
SPONGE LAP 4X18 X RAY DECT (DISPOSABLE) ×3 IMPLANT
STRIP CLOSURE SKIN 1/2X4 (GAUZE/BANDAGES/DRESSINGS) ×2 IMPLANT
SUT ETHILON 3 0 PS 1 (SUTURE) ×2 IMPLANT
SUT MNCRL AB 4-0 PS2 18 (SUTURE) ×3 IMPLANT
SYR CONTROL 10ML LL (SYRINGE) IMPLANT
TOWEL OR 17X24 6PK STRL BLUE (TOWEL DISPOSABLE) ×3 IMPLANT
TOWEL OR 17X26 10 PK STRL BLUE (TOWEL DISPOSABLE) ×3 IMPLANT
TOWEL OR NON WOVEN STRL DISP B (DISPOSABLE) ×3 IMPLANT
TUBE CONNECTING 12'X1/4 (SUCTIONS) ×1
TUBE CONNECTING 12X1/4 (SUCTIONS) ×2 IMPLANT
WATER STERILE IRR 1000ML POUR (IV SOLUTION) ×4 IMPLANT
YANKAUER SUCT BULB TIP NO VENT (SUCTIONS) IMPLANT

## 2017-07-21 NOTE — Interval H&P Note (Signed)
History and Physical Interval Note:  07/21/2017 12:50 PM  Natasha Woodard  has presented today for surgery, with the diagnosis of Unspecified fracture of the lower end of left radius  S52.502  The various methods of treatment have been discussed with the patient and family. After consideration of risks, benefits and other options for treatment, the patient has consented to  Procedure(s): OPEN REDUCTION INTERNAL FIXATION (ORIF) LEFT DISTAL RADIAL FRACTURE (Left) as a surgical intervention .  The patient's history has been reviewed, patient examined, no change in status, stable for surgery.  I have reviewed the patient's chart and labs.  Questions were answered to the patient's satisfaction.     Shameria Trimarco D

## 2017-07-21 NOTE — Progress Notes (Signed)
Orthopedic Tech Progress Note Patient Details:  Natasha Woodard [birth year] [id number]  Ortho Devices Type of Ortho Device: Arm sling Ortho Device/Splint Interventions: Ordered, Application   Jennye Moccasin 07/21/2017, 3:24 PM

## 2017-07-21 NOTE — Op Note (Signed)
07/21/2017  2:43 PM  PATIENT:  Natasha Woodard    PRE-OPERATIVE DIAGNOSIS:  Unspecified fracture of the lower end of left radius  S52.502  POST-OPERATIVE DIAGNOSIS:  Same  PROCEDURE:  OPEN REDUCTION INTERNAL FIXATION (ORIF) LEFT DISTAL RADIAL FRACTURE  SURGEON:  Cola Highfill D, MD  ASSISTANT: none  ANESTHESIA:   regional  PREOPERATIVE INDICATIONS:  Natasha Woodard is a  76 y.o. female with a diagnosis of Unspecified fracture of the lower end of left radius  S52.502 who failed conservative measures and elected for surgical management.    The risks benefits and alternatives were discussed with the patient preoperatively including but not limited to the risks of infection, bleeding, nerve injury, cardiopulmonary complications, the need for revision surgery, among others, and the patient was willing to proceed.  OPERATIVE IMPLANTS: DVR plate  OPERATIVE FINDINGS: unstable fx  BLOOD LOSS: min  COMPLICATIONS: none  TOURNIQUET TIME:  OPERATIVE PROCEDURE:  Patient was identified in the preoperative holding area and site was marked by me She was transported to the operating theater and placed on the table in supine position taking care to pad all bony prominences. After a preincinduction time out anesthesia was induced. The left upper extremity was prepped and draped in normal sterile fashion and a pre-incision timeout was performed. She received ancef for preoperative antibiotics.   I made a 5 cm incision centered over her FCR tendon and dissected down carefully to the level of the flexor tendon sheath and incise this longitudinally and retracted the FCR radially and incised the dorsal aspect of the sheath.   I bluntly dissected the FPL muscle belly away from the brachioradialis and then sharply incised the pronator tendon from the distal radius and from the wrist capsule. I Elevated this off the bone the fractures visible.   I released the brachioradialis from its insertion.  I then debrided the fracture and performed a manual reduction.   I selected a plate and I placed it on the bone. I pinned it into place and was happy on multiple radiographic views with it's placement. I then fixed the plate distally with the locking pegs. I confirmed no articular penetration with the pegs and that none were prominent dorsally.   I then reduced the plate to the shaft improving the volar and radial tilt of her distal radius.  I was happy with the final fluoro xrays    I thoroughly irrigated the wound and closed the pronator over top of the plate and then closed the skin in layers with absorbable stitch. Sterile dressing was applied using the PACU in stable condition.  POST OPERATIVE PLAN: NWB, Splint full time. Ambulate for DVT px.

## 2017-07-21 NOTE — Anesthesia Procedure Notes (Addendum)
Anesthesia Regional Block: Interscalene brachial plexus block   Pre-Anesthetic Checklist: ,, timeout performed, Correct Patient, Correct Site, Correct Laterality, Correct Procedure, Correct Position, site marked, Risks and benefits discussed,  Surgical consent,  Pre-op evaluation,  At surgeon's request and post-op pain management  Laterality: Left  Prep: chloraprep       Needles:  Injection technique: Single-shot  Needle Type: Stimulator Needle - 40     Needle Length: 4cm  Needle Gauge: 22     Additional Needles:   Procedures:, nerve stimulator,,,,,,,  Narrative:  Start time: 07/21/2017 12:50 PM End time: 07/21/2017 12:58 PM Injection made incrementally with aspirations every 5 mL. Anesthesiologist: Lewie Loron  Additional Notes: BP cuff, EKG monitors applied. Sedation begun. Nerve location verified with U/S. Anesthetic injected incrementally, slowly , and after neg aspirations under direct u/s guidance. Good perineural spread. Tolerated well.

## 2017-07-21 NOTE — Discharge Instructions (Signed)
Keep splint C/D/I  Resume pain meds and ASA 81

## 2017-07-21 NOTE — Transfer of Care (Signed)
Immediate Anesthesia Transfer of Care Note  Patient: Natasha Woodard  Procedure(s) Performed: Procedure(s): OPEN REDUCTION INTERNAL FIXATION (ORIF) LEFT DISTAL RADIAL FRACTURE (Left)  Patient Location: PACU  Anesthesia Type:MAC and Regional  Level of Consciousness: awake, alert  and oriented  Airway & Oxygen Therapy: Patient Spontanous Breathing and Patient connected to nasal cannula oxygen  Post-op Assessment: Report given to RN and Post -op Vital signs reviewed and stable  Post vital signs: Reviewed and stable  Last Vitals:  Vitals:   07/21/17 1009  BP: (!) 159/99  Pulse: 85  Resp: 18  Temp: 36.7 C  SpO2: 94%    Last Pain:  Vitals:   07/21/17 1032  TempSrc:   PainSc: 6       Patients Stated Pain Goal: 3 (63/84/66 5993)  Complications: No apparent anesthesia complications

## 2017-07-21 NOTE — H&P (Signed)
ORTHOPAEDIC CONSULTATION  REQUESTING PHYSICIAN: Renette Butters, MD  Chief Complaint: Left DR fracture  HPI: Natasha Woodard is a 76 y.o. female who complains of  L wrist fracture from a fall  Past Medical History:  Diagnosis Date  . Calcific tendonitis 2008   Treatment of left leg  . Cardiomyopathy ([place])   . Coronary artery disease    mild-moderate CAD 06/30/17 cath  . CTS (carpal tunnel syndrome)   . Diabetes mellitus    Type II  . Dysrhythmia    patient said that she cant remember what it is  . Hiatal hernia    with reflux  . Hyperlipidemia    Statin intolerant  . Hypertension   . Ichthyosis congenita   . NSVD (normal spontaneous vaginal delivery) 1971 & 1972  . Obesity   . Plantar fasciitis    Left  . Pulmonary nodule    Imaged multiple times and benign appearing  . Wears glasses    Past Surgical History:  Procedure Laterality Date  . ABDOMINAL HYSTERECTOMY  1975   TAH-BSO  . ABSCESS DRAINAGE  1972   right breast  . BREAST BIOPSY Left 01/14/2009   Stereo- Benign  . CARPAL TUNNEL RELEASE Right 07/02/2014   Procedure: RIGHT CARPAL TUNNEL RELEASE;  Surgeon: Wynonia Sours, MD;  Location: [facility];  Service: Orthopedics;  Laterality: Right;  . CARPAL TUNNEL RELEASE Left 06/11/2015   Procedure: LEFT CARPAL TUNNEL RELEASE;  Surgeon: Daryll Brod, MD;  Location: [facility];  Service: Orthopedics;  Laterality: Left;  REGIONAL/FAB  . COLONOSCOPY    . corn removal  1968   right  . RIGHT/LEFT HEART CATH AND CORONARY ANGIOGRAPHY N/A 06/30/2017   Procedure: Right/Left Heart Cath and Coronary Angiography;  Surgeon: Leonie Man, MD;  Location: [facility];  Service: Cardiovascular;  Laterality: N/A;  . ROTATOR CUFF REPAIR     left shoulder   Social History   Social History  . Marital status: Married    Spouse name: N/A  . Number of children: 2  . Years of education: N/A   Occupational History  .  Retired   Social  History Main Topics  . Smoking status: Former Smoker    Years: 28.00    Types: Cigarettes    Quit date: 12/12/1993  . Smokeless tobacco: Never Used  . Alcohol use No  . Drug use: No  . Sexual activity: No   Other Topics Concern  . None   Social History Narrative   Two kids, two grandchildren.    Married 1969   Retired.    Family History  Problem Relation Age of Onset  . Heart disease Mother        s/p pacemaker  . Diabetes Mother   . Heart disease Father   . Diabetes Father   . Heart disease Brother        MI  . Cancer Brother        Lung  . Colon cancer Neg Hx   . Breast cancer Neg Hx    Allergies  Allergen Reactions  . Ezetimibe-Simvastatin Other (See Comments)    REACTION: Muscle aches (side effect)  . Lipitor [Atorvastatin] Other (See Comments)    Leg weakness   . Pravastatin Sodium Other (See Comments)    REACTION: Muscle aches (side effect)  . Sulfonamide Derivatives Nausea And Vomiting   Prior to Admission medications   Medication Sig Start Date End Date  Taking? Authorizing Provider  aspirin 81 MG tablet Take 81 mg by mouth daily.     Yes [provider]  carvedilol (COREG) 6.25 MG tablet Take 1 tablet (6.25 mg total) by mouth 2 (two) times daily. 07/14/17  Yes Almyra Deforest, PA  Cholecalciferol (VITAMIN D) 1000 UNITS capsule Take 1,000 Units by mouth daily.    Yes [provider]  enalapril (VASOTEC) 20 MG tablet TAKE ONE TABLET BY MOUTH ONCE DAILY Patient taking differently: Take 20 mg by mouth daily.  09/22/16  Yes Tonia Ghent, MD  glucose blood (ACCU-CHEK AVIVA PLUS) test strip USE TO TEST BLOOD SUGAR ONCE DAILY FOR DM 250.00 08/19/14  Yes Tonia Ghent, MD  ibuprofen (ADVIL,MOTRIN) 200 MG tablet Take 200 mg by mouth 2 (two) times daily.   Yes [provider]  Lancets (ACCU-CHEK MULTICLIX) lancets USE ONE LANCET TO TEST BLOOD GLUCOSE DAILY FOR DM 250.00 08/19/14  Yes Tonia Ghent, MD  lovastatin (MEVACOR) 20 MG tablet Take 1  tablet (20 mg total) by mouth at bedtime. 07/14/17  Yes Almyra Deforest, PA  metFORMIN (GLUCOPHAGE) 500 MG tablet Take 0.5 tablets (250 mg total) by mouth 2 (two) times daily with a meal. 09/22/16  Yes Tonia Ghent, MD  Multiple Vitamin (MULTIVITAMIN WITH MINERALS) TABS tablet Take 1 tablet by mouth daily.   Yes [provider]  spironolactone (ALDACTONE) 25 MG tablet Take 0.5 tablets (12.5 mg total) by mouth daily. 07/14/17 10/12/17 Yes Almyra Deforest, PA  traMADol (ULTRAM) 50 MG tablet Take 1 tablet (50 mg total) by mouth every 12 (twelve) hours as needed. Patient taking differently: Take 50 mg by mouth 2 (two) times daily as needed for moderate pain.  01/31/17  Yes Tonia Ghent, MD   No results found.  Positive ROS: All other systems have been reviewed and were otherwise negative with the exception of those mentioned in the HPI and as above.  Labs cbc  Recent Labs  07/20/17 1306  WBC 5.1  HGB 13.7  HCT 40.8  PLT 406*    Labs inflam No results for input(s): CRP in the last 72 hours.  Invalid input(s): ESR  Labs coag No results for input(s): INR, PTT in the last 72 hours.  Invalid input(s): PT   Recent Labs  07/20/17 1306 07/21/17 1003  NA 138 141  K 6.8* 4.4  CL 104 108  CO2 25 26  GLUCOSE 90 114*  BUN 17 13  CREATININE 0.88 0.81  CALCIUM 9.6 9.7    Physical Exam: Vitals:   07/21/17 1009  BP: (!) 159/99  Pulse: 85  Resp: 18  Temp: 98.1 F (36.7 C)  SpO2: 94%   General: Alert, no acute distress Cardiovascular: No pedal edema Respiratory: No cyanosis, no use of accessory musculature GI: No organomegaly, abdomen is soft and non-tender Skin: No lesions in the area of chief complaint other than those listed below in MSK exam.  Neurologic: Sensation intact distally save for the below mentioned MSK exam Psychiatric: Patient is competent for consent with normal mood and affect Lymphatic: No axillary or cervical lymphadenopathy  MUSCULOSKELETAL:  LUE: mod  swelling, NVI, comp soft Other extremities are atraumatic with painless ROM and NVI.  Assessment: L intra-articular DR fx  Plan: ORIF today   Renette Butters, MD Cell [phone]   07/21/2017 12:49 PM

## 2017-07-21 NOTE — Anesthesia Preprocedure Evaluation (Addendum)
Anesthesia Evaluation  Patient identified by MRN, date of birth, ID band Patient awake    Reviewed: Allergy & Precautions, NPO status , Patient's Chart, lab work & pertinent test results, reviewed documented beta blocker date and time   Airway Mallampati: II   Neck ROM: full    Dental   Pulmonary COPD, former smoker,    breath sounds clear to auscultation       Cardiovascular hypertension, Pt. on medications and Pt. on home beta blockers + angina + CAD and + Peripheral Vascular Disease  + dysrhythmias  Rhythm:regular Rate:Normal     Neuro/Psych  Neuromuscular disease    GI/Hepatic hiatal hernia,   Endo/Other  diabetes, Type 2  Renal/GU      Musculoskeletal   Abdominal   Peds  Hematology   Anesthesia Other Findings   Reproductive/Obstetrics                            Anesthesia Physical  Anesthesia Plan  ASA: III  Anesthesia Plan: Regional   Post-op Pain Management:    Induction:   PONV Risk Score and Plan: 2 and Ondansetron and Propofol infusion  Airway Management Planned:   Additional Equipment:   Intra-op Plan:   Post-operative Plan:   Informed Consent: I have reviewed the patients History and Physical, chart, labs and discussed the procedure including the risks, benefits and alternatives for the proposed anesthesia with the patient or authorized representative who has indicated his/her understanding and acceptance.   Dental advisory given  Plan Discussed with: CRNA  Anesthesia Plan Comments:       Anesthesia Quick Evaluation

## 2017-07-24 ENCOUNTER — Encounter (HOSPITAL_COMMUNITY): Payer: Self-pay | Admitting: Orthopedic Surgery

## 2017-07-24 NOTE — Anesthesia Postprocedure Evaluation (Addendum)
Anesthesia Post Note  Patient: Natasha Woodard  Procedure(s) Performed: Procedure(s) (LRB): OPEN REDUCTION INTERNAL FIXATION (ORIF) LEFT DISTAL RADIAL FRACTURE (Left)     Patient location during evaluation: PACU Anesthesia Type: Regional Level of consciousness: sedated and patient cooperative Pain management: pain level controlled Vital Signs Assessment: post-procedure vital signs reviewed and stable Respiratory status: spontaneous breathing Cardiovascular status: stable Anesthetic complications: no    Last Vitals:  Vitals:   07/21/17 1506 07/21/17 1516  BP: (!) 149/73   Pulse: 88   Resp: 20   Temp:  (!) 36.3 C  SpO2: 98%     Last Pain:  Vitals:   07/21/17 1516  TempSrc:   PainSc: 0-No pain                 Lewie Loron

## 2017-07-25 NOTE — Telephone Encounter (Signed)
I have called the patient and reviewed BMET result from late last week, initial K was elevated on 8/9 due to hemolysis, subsequent K was normal on the following day.

## 2017-07-26 ENCOUNTER — Encounter: Payer: Self-pay | Admitting: Physician Assistant

## 2017-07-29 ENCOUNTER — Telehealth: Payer: Self-pay | Admitting: Adult Health

## 2017-07-29 NOTE — Telephone Encounter (Addendum)
  Patient complained of unable to catch her breath, this awakened her last night. During the daytime today, she feels as if she cannot catch her breath. States she has been having breathing problems when she is walking with wheezing. Can occur at rest.  She is becoming concerned about this.   I have reviewed her history and recent cardiac cath report. She has pulmonary hypertension per pressure readings. Non obstructive CAD.   I have advised her to see her PCP for breathing tests, possible PFTS, and need for referral to pulmonologist. She does not smoke.   She has reduced EF of 35%, and has not had evidence of fluid retention, edema, or PND. I have advised her that if her breathing status worsened or she became uncomfortable lying down, she is to come to ER. I will try and get her an appointment with our practice sooner than in September when it is originally planned.

## 2017-07-31 ENCOUNTER — Telehealth: Payer: Self-pay | Admitting: Cardiology

## 2017-07-31 ENCOUNTER — Telehealth: Payer: Self-pay

## 2017-07-31 DIAGNOSIS — S52502D Unspecified fracture of the lower end of left radius, subsequent encounter for closed fracture with routine healing: Secondary | ICD-10-CM | POA: Diagnosis not present

## 2017-07-31 NOTE — Telephone Encounter (Signed)
New message    Attaching message from Joni Reining PA   Please call this patient on Monday to make appointment to be seen. She is experiencing worsening breathing status. Has appt in Sept but does not want to wait that long.   Thank you,.

## 2017-07-31 NOTE — Telephone Encounter (Signed)
Did not  Need this encounter

## 2017-07-31 NOTE — Telephone Encounter (Signed)
Pt has appt with cardiologist on 08/01/17 for SOB. Pt cannot come in for appt today because has ortho appt already scheduled today. For 2 weeks pt has had SOB with some wheezing upon exertion. No CP, H/A, dizziness. If pt still needs referral for pulmonology after seeing card will cb. If pt condition worsens prior to card appt pt was instructed to go to ED. Pt voiced understanding. FYI to Dr Para March.

## 2017-07-31 NOTE — Telephone Encounter (Signed)
Noted. Thanks.

## 2017-07-31 NOTE — Telephone Encounter (Signed)
[id number] 

## 2017-08-01 ENCOUNTER — Encounter: Payer: Self-pay | Admitting: Physician Assistant

## 2017-08-01 ENCOUNTER — Ambulatory Visit
Admission: RE | Admit: 2017-08-01 | Discharge: 2017-08-01 | Disposition: A | Discharge status: Home / Self Care | Payer: Medicare Other | Source: Ambulatory Visit | Attending: Physician Assistant | Admitting: Physician Assistant

## 2017-08-01 ENCOUNTER — Ambulatory Visit (INDEPENDENT_AMBULATORY_CARE_PROVIDER_SITE_OTHER): Payer: Medicare Other | Admitting: Physician Assistant

## 2017-08-01 VITALS — BP 163/95 | HR 91 | Ht 62.0 in | Wt 180.2 lb

## 2017-08-01 DIAGNOSIS — E119 Type 2 diabetes mellitus without complications: Secondary | ICD-10-CM

## 2017-08-01 DIAGNOSIS — R0602 Shortness of breath: Secondary | ICD-10-CM | POA: Diagnosis not present

## 2017-08-01 DIAGNOSIS — E785 Hyperlipidemia, unspecified: Secondary | ICD-10-CM

## 2017-08-01 DIAGNOSIS — I10 Essential (primary) hypertension: Secondary | ICD-10-CM | POA: Diagnosis not present

## 2017-08-01 DIAGNOSIS — E875 Hyperkalemia: Secondary | ICD-10-CM

## 2017-08-01 DIAGNOSIS — I5023 Acute on chronic systolic (congestive) heart failure: Secondary | ICD-10-CM

## 2017-08-01 DIAGNOSIS — I5021 Acute systolic (congestive) heart failure: Secondary | ICD-10-CM | POA: Diagnosis not present

## 2017-08-01 LAB — BASIC METABOLIC PANEL
BUN / CREAT RATIO: 17 (ref 12–28)
BUN: 15 mg/dL (ref 8–27)
CHLORIDE: 104 mmol/L (ref 96–106)
CO2: 24 mmol/L (ref 20–29)
Calcium: 9.8 mg/dL (ref 8.7–10.3)
Creatinine, Ser: 0.88 mg/dL (ref 0.57–1.00)
GFR calc non Af Amer: 64 mL/min/{1.73_m2} (ref >59)
GFR, EST AFRICAN AMERICAN: 74 mL/min/{1.73_m2} (ref >59)
GLUCOSE: 95 mg/dL (ref 65–99)
POTASSIUM: 5.5 mmol/L — AB (ref 3.5–5.2)
Sodium: 143 mmol/L (ref 134–144)

## 2017-08-01 LAB — PRO B NATRIURETIC PEPTIDE: NT-PRO BNP: 3543 pg/mL — AB (ref 0–738)

## 2017-08-01 MED ORDER — FUROSEMIDE 20 MG PO TABS: ORAL_TABLET | ORAL | 5 refills | Status: DC

## 2017-08-01 NOTE — Progress Notes (Addendum)
Cardiology Office Note    Date:  08/02/2017   ID:  Kent, [place] 05/11/1941, MRN [id number]  PCP:  [name], MD  Cardiologist:  Dr. Herbie Baltimore  Chief Complaint  Patient presents with  . Follow-up    seen for Dr. Herbie Baltimore    History of Present Illness:  Natasha Woodard is a 76 y.o. female with PMH of DM II, HLD intolerant to statins, HTN, and obesity. On the most recent visit, it was noticed herheart rate was in the high 40s. EKG showed PVCs. She was referred to cardiology service for further evaluation. She denies any chest pain or shortness of breath. She denies any low extremity edema, orthopnea or PND. On physical exam, she does have occasional skipped heartbeat. EKG showed she has both PVCs and PACs. The PVCs are generally benign and may not need treatment unless become more frequent.  I saw the patient initially on 06/09/2017, because of the PVCs, we obtained an echocardiogram which came back severely abnormal showing EF 25-30%. Her 24-hour Holter monitor obtained on 06/22/2017 showed 16954 ventricular ectopy. There were 16056 single or 2, 380 couplets, 44 runs (longest run 7 beats), 8490 supraventricular beats mostly singlet with 11 runs and longest run 15 beats. I brought the patient back on 06/27/2017, we discussed various options, eventually we decided to proceed with left and right cardiac catheterization. She eventually underwent the planned procedure on 06/30/2017 which showed 55 proximal RCA, 40% proximal left circumflex, 40% OM1 lesion, 40% mid left circumflex lesion, 50% ostial D2 lesion, EF 25-35%. Moderately elevated LVEDP, wedge pressure 16 mmHg. LVEDP 26 mmHg. Post cath, she did see some floaters, this was eventually resolved. She returned to the ED on 07/11/2017 with a left wrist fracture after falling down several steps.  I last saw the patient on 07/14/2017, heart 24 hour heart monitor showed she had very frequent PVCs, I uptitrated her beta blocker by increasing  carvedilol to 6.25 mg twice a day. I discontinue her amlodipine and added spironolactone 12.5 mg daily. She was not on statin at that time, after discussed various options, we also started on lovastatin 20mg  daily. One week to repeat basic metabolic panel was stable. Patient presents today for evaluation of increasing shortness of breath, orthopnea and occasional paroxysmal nocturnal dyspnea. She was also noted to have 1+ pitting edema in bilateral lower extremity. There is diminished breath sound in the lung with bibasilar crackle. Most likely cause for her increasing shortness of breath is acute on chronic systolic heart failure. She denies any obvious fever or chill. Interestingly, on physical exam, she also noted to have prolonged expiratory phase, she does not have prior history of COPD. I recommended a basic metabolic panel and BNP along with chest x-ray for assessment.     Past Medical History:  Diagnosis Date  . Calcific tendonitis 2008   Treatment of left leg  . Cardiomyopathy (HCC)   . Coronary artery disease    mild-moderate CAD 06/30/17 cath  . CTS (carpal tunnel syndrome)   . Diabetes mellitus    Type II  . Dysrhythmia    patient said that she cant remember what it is  . Hiatal hernia    with reflux  . Hyperlipidemia    Statin intolerant  . Hypertension   . Ichthyosis congenita   . NSVD (normal spontaneous vaginal delivery) 1971 & 1972  . Obesity   . Plantar fasciitis    Left  . Pulmonary nodule  Imaged multiple times and benign appearing  . Wears glasses     Past Surgical History:  Procedure Laterality Date  . ABDOMINAL HYSTERECTOMY  1975   TAH-BSO  . ABSCESS DRAINAGE  1972   right breast  . BREAST BIOPSY Left 01/14/2009   Stereo- Benign  . CARPAL TUNNEL RELEASE Right 07/02/2014   Procedure: RIGHT CARPAL TUNNEL RELEASE;  Surgeon: Nicki Reaper, MD;  Location: [facility];  Service: Orthopedics;  Laterality: Right;  . CARPAL TUNNEL RELEASE Left  06/11/2015   Procedure: LEFT CARPAL TUNNEL RELEASE;  Surgeon: Cindee Salt, MD;  Location:  SURGERY CENTER;  Service: Orthopedics;  Laterality: Left;  REGIONAL/FAB  . COLONOSCOPY    . corn removal  1968   right  . OPEN REDUCTION INTERNAL FIXATION (ORIF) DISTAL RADIAL FRACTURE Left 07/21/2017   Procedure: OPEN REDUCTION INTERNAL FIXATION (ORIF) LEFT DISTAL RADIAL FRACTURE;  Surgeon: Sheral Apley, MD;  Location: [facility] OR;  Service: Orthopedics;  Laterality: Left;  . RIGHT/LEFT HEART CATH AND CORONARY ANGIOGRAPHY N/A 06/30/2017   Procedure: Right/Left Heart Cath and Coronary Angiography;  Surgeon: Marykay Lex, MD;  Location: [facility];  Service: Cardiovascular;  Laterality: N/A;  . ROTATOR CUFF REPAIR     left shoulder    Current Medications: Outpatient Medications Prior to Visit  Medication Sig Dispense Refill  . aspirin 81 MG tablet Take 81 mg by mouth daily.      . carvedilol (COREG) 6.25 MG tablet Take 1 tablet (6.25 mg total) by mouth 2 (two) times daily. 60 tablet 5  . Cholecalciferol (VITAMIN D) 1000 UNITS capsule Take 1,000 Units by mouth daily.     . enalapril (VASOTEC) 20 MG tablet TAKE ONE TABLET BY MOUTH ONCE DAILY (Patient taking differently: Take 20 mg by mouth daily. ) 90 tablet 3  . glucose blood (ACCU-CHEK AVIVA PLUS) test strip USE TO TEST BLOOD SUGAR ONCE DAILY FOR DM 250.00 100 each 3  . ibuprofen (ADVIL,MOTRIN) 200 MG tablet Take 200 mg by mouth 2 (two) times daily.    . Lancets (ACCU-CHEK MULTICLIX) lancets USE ONE LANCET TO TEST BLOOD GLUCOSE DAILY FOR DM 250.00 102 each 3  . lovastatin (MEVACOR) 20 MG tablet Take 1 tablet (20 mg total) by mouth at bedtime. 30 tablet 5  . metFORMIN (GLUCOPHAGE) 500 MG tablet Take 0.5 tablets (250 mg total) by mouth 2 (two) times daily with a meal. 90 tablet 3  . Multiple Vitamin (MULTIVITAMIN WITH MINERALS) TABS tablet Take 1 tablet by mouth daily.    Marland Kitchen spironolactone (ALDACTONE) 25 MG tablet Take 0.5 tablets (12.5  mg total) by mouth daily. 15 tablet 5  . traMADol (ULTRAM) 50 MG tablet Take 1 tablet (50 mg total) by mouth every 12 (twelve) hours as needed. (Patient taking differently: Take 50 mg by mouth 2 (two) times daily as needed for moderate pain. ) 180 tablet 1   No facility-administered medications prior to visit.      Allergies:   Ezetimibe-simvastatin; Lipitor [atorvastatin]; Pravastatin sodium; and Sulfonamide derivatives   Social History   Social History  . Marital status: Married    Spouse name: N/A  . Number of children: 2  . Years of education: N/A   Occupational History  .  Retired   Social History Main Topics  . Smoking status: Former Smoker    Years: 28.00    Types: Cigarettes    Quit date: 12/12/1993  . Smokeless tobacco: Never Used  . Alcohol use  No  . Drug use: No  . Sexual activity: No   Other Topics Concern  . None   Social History Narrative   Two kids, two grandchildren.    Married 1969   Retired.      Family History:  The patient's family history includes Cancer in her brother; Diabetes in her father and mother; Heart disease in her brother, father, and mother.   ROS:   Please see the history of present illness.    ROS All other systems reviewed and are negative.   PHYSICAL EXAM:   VS:  BP (!) 163/95   Pulse 91   Ht 5\' 2"  (1.575 m)   Wt 180 lb 3.2 oz (81.7 kg)   BMI 32.96 kg/m    GEN: Well nourished, well developed, in no acute distress  HEENT: normal  Neck: no JVD, carotid bruits, or masses Cardiac: RRR; no murmurs, rubs, or gallops  1+ edema  Respiratory:  normal work of breathing +diminished breath sound with bibasilar crackle GI: soft, nontender, nondistended, + BS MS: no deformity or atrophy  Skin: warm and dry, no rash Neuro:  Alert and Oriented x 3, Strength and sensation are intact Psych: euthymic mood, full affect  Wt Readings from Last 3 Encounters:  08/01/17 180 lb 3.2 oz (81.7 kg)  07/21/17 176 lb 4.8 oz (80 kg)  07/20/17 176 lb  4.8 oz (80 kg)      Studies/Labs Reviewed:   EKG:  EKG is not ordered today.    Recent Labs: 05/30/2017: ALT 10; TSH 0.74 07/20/2017: Hemoglobin 13.7; Platelets 406 08/01/2017: BUN 15; Creatinine, Ser 0.88; NT-Pro BNP 3,543; Potassium 5.5; Sodium 143   Lipid Panel    Component Value Date/Time   CHOL 183 09/15/2016 1253   TRIG 129.0 09/15/2016 1253   HDL 39.30 09/15/2016 1253   CHOLHDL 5 09/15/2016 1253   VLDL 25.8 09/15/2016 1253   LDLCALC 118 (H) 09/15/2016 1253   LDLDIRECT 53 08/10/2009    Additional studies/ records that were reviewed today include:   24 hour Holter monitor 06/22/2017 Study Highlights     Mostly sinus rhythm with sinus bradycardia to sinus tachycardia.  Minimum heart rate 45 bpm, maximum heart rate 135 BPM. Average heart rate 85 BPM.  13% ventricular ectopy/PVCs: The majority are singlets. Rare couplets and runs. - Fasted from was 172 bpm. Longest run 7 beats.  6-7% supraventricular ectopy. Majority are singlets. Some pairs and bigeminy. 11 runs -fastest run 166 BPM. Longest run 15 beats.  Significant ventricular and supraventricular ectopy with short nonsustained VT runs as well as PSVT/PAT runs.    Echo 06/22/2017 LV EF: 25% - 30%  Study Conclusions  - Left ventricle: The cavity size was mildly dilated. Wall thickness was normal. Systolic function was severely reduced. The estimated ejection fraction was in the range of 25% to 30%. Diffuse hypokinesis. Doppler parameters are consistent with abnormal left ventricular relaxation (grade 1 diastolic dysfunction). Doppler parameters are consistent with high ventricular filling pressure. - Mitral valve: There was mild regurgitation. - Left atrium: The atrium was mildly dilated. - Pulmonary arteries: Systolic pressure was moderately increased.  Impressions:  - Severe global reduction in LV systolic function; mild diastolic dysfunction with elevated LV filling pressure;  mild LVE; mild MR; mild LAE; mild TR with moderately elevated pulmonary pressure.    Cath 06/30/2017 Conclusion     Prox RCA lesion, 55 %stenosed.  Prox Cx lesion, 40 %stenosed. 1st Mrg lesion, 45 %stenosed. Mid Cx lesion, 50 %stenosed.  Ost 2nd Diag lesion, 50 %stenosed.  There is severe left ventricular systolic dysfunction. The left ventricular ejection fraction is 25-35% by visual estimate.  LV end diastolic pressure is moderately elevated.  Hemodynamic findings consistent with mild pulmonary hypertension.  Mild to moderate multivessel CAD with no obstructive lesions to explain severely reduced EF.  This was suggest nonischemic cardiomyopathy moderate LV filling pressure elevation, but relatively normal right heart pressures. She seems to be relatively euvolemic, but would benefit from afterload reduction and standing oral diuretic.  Plan: We will optimize medical management in the outpatient setting. Patient will be discharged home from postprocedure unit when stable.    Left wrist X ray 07/11/2017 FINDINGS: Comminuted, impacted fracture of the distal radial metaphysis and epiphysis with radial and dorsal displacement and dorsal angulation of the distal fragment. There is also a mildly displaced ulnar styloid fracture.  IMPRESSION: Distal radius and ulnar styloid fractures.     ASSESSMENT:    1. Acute on chronic systolic heart failure (HCC)   2. SOB (shortness of breath)   3. Hyperlipidemia, unspecified hyperlipidemia type   4. Essential hypertension   5. Controlled type 2 diabetes mellitus without complication, without long-term current use of insulin (HCC)      PLAN:  In order of problems listed above:  1. Acute on chronic systolic heart failure: Baseline EF 25-30%. Appears to be mildly volume overloaded on physical exam. Plan to obtain basic metabolic panel and BMP. I also added Lasix 40 mg daily for 2 days before going down to 20 mg daily  thereafter which. She is on very low-dose spironolactone. I am concerned about decompensated heart failure, I will try to see her back in one week  2. Hyperkalemia: Her potassium level occasionally goes above normal, although last time was quite elevated, this was attributed to hemolysis. Looking back, she had some intermittent elevated potassium level. We will obtain another basic metabolic panel today. Expect potassium to go down with additional diuretic.  3. Hypertension: Blood pressure elevated today, likely related to her shortness of breath. Will hold off on titrating to medication  4. Hyperlipidemia: History of statin intolerance, however she is willing to try lovastatin 20 mg daily. Plan for fasting lipid panel and LFTs in 4-6 weeks.  5. DM II: Managed by primary care provider  6. Frequent PVCs: She had more than 10,000 PVCs in a single day on recent heart monitor. Carvedilol was increased during the last visit.  7. CAD: Nonobstructive CAD on recent cath. Added lovastatin 20 mg daily during the previous visit. Plan to obtain fasting lipid panel and LFTs.    Medication Adjustments/Labs and Tests Ordered: Current medicines are reviewed at length with the patient today.  Concerns regarding medicines are outlined above.  Medication changes, Labs and Tests ordered today are listed in the Patient Instructions below. Patient Instructions  Medication Instructions:  START FUROSEMIDE 40 MG DAILY FOR 2 DAYS THEN DECREASE TO 20 MG DAILY   Labwork: BMET/BNP TODAY   Testing/Procedures: A chest x-ray takes a picture of the organs and structures inside the chest, including the heart, lungs, and blood vessels. This test can show several things, including, whether the heart is enlarges; whether fluid is building up in the lungs; and whether pacemaker / defibrillator leads are still in place. [facility]   Follow-Up: ON AUGUST 29 AT 11:30  If you need a refill on your cardiac medications  before your next appointment, please call your pharmacy.   Heart Failure Prevention Instruction:  1. Avoid salt 2. Limit fluid intake to < 2 liters or between 32-64oz 3. Weigh yourself every morning and call cardiology if weight increase by more than 3 lbs overnight or 5 lbs in a single week YOUR WEIGHT TO BE DONE FIRST [address]     Ramond Dial, [place]  08/02/2017 2:05 PM    [facility] [address] [place], [place], [place]  [address] Phone: [phone]; Fax: [phone]

## 2017-08-01 NOTE — Patient Instructions (Addendum)
Medication Instructions:  START FUROSEMIDE 40 MG DAILY FOR 2 DAYS THEN DECREASE TO 20 MG DAILY   Labwork: BMET/BNP TODAY   Testing/Procedures: A chest x-ray takes a picture of the organs and structures inside the chest, including the heart, lungs, and blood vessels. This test can show several things, including, whether the heart is enlarges; whether fluid is building up in the lungs; and whether pacemaker / defibrillator leads are still in place. [facility]   Follow-Up: ON AUGUST 29 AT 11:30  If you need a refill on your cardiac medications before your next appointment, please call your pharmacy.   Heart Failure Prevention Instruction: 1. Avoid salt 2. Limit fluid intake to < 2 liters or between 32-64oz 3. Weigh yourself every morning and call cardiology if weight increase by more than 3 lbs overnight or 5 lbs in a single week YOUR WEIGHT TO BE DONE FIRST [facility] MORNING

## 2017-08-01 NOTE — Telephone Encounter (Signed)
Pt had appt with hao meng, pa-c

## 2017-08-02 ENCOUNTER — Telehealth: Payer: Self-pay | Admitting: Family Medicine

## 2017-08-02 ENCOUNTER — Ambulatory Visit: Payer: Medicare Other | Admitting: Family Medicine

## 2017-08-02 ENCOUNTER — Encounter: Payer: Self-pay | Admitting: Physician Assistant

## 2017-08-02 ENCOUNTER — Ambulatory Visit: Payer: Medicare Other | Admitting: Internal Medicine

## 2017-08-02 NOTE — Telephone Encounter (Signed)
Patient has an appointment with Dr.Duncan for shortness of breath tomorrow.  Patient wants to know if Dr.Duncan could see her this afternoon.  Patient said she can't walk a minute without sitting down.  Patient saw Dr.Meng, her Cardiologist, yesterday, but he told her she would have to see Dr.Duncan about her lungs.  Can patient be seen this afternoon?

## 2017-08-02 NOTE — Telephone Encounter (Signed)
If she is so short of breath that she needs to be seen emergently today, then she likely needs to be in the emergency room. Otherwise I would keep the appointment as scheduled. I don't even get into the clinic today until 2 PM.

## 2017-08-02 NOTE — Telephone Encounter (Signed)
PLEASE NOTE: All timestamps contained within this report are represented as [place] Standard Time. CONFIDENTIALTY NOTICE: This fax transmission is intended only for the addressee. It contains information that is legally privileged, confidential or otherwise protected from use or disclosure. If you are not the intended recipient, you are strictly prohibited from reviewing, disclosing, copying using or disseminating any of this information or taking any action in reliance on or regarding this information. If you have received this fax in error, please notify us immediately by telephone so that we can arrange for its return to [place]. Phone: [phone], Toll-Free: [phone], Fax: [phone] Page: 1 of 1 Call Id: [id number] [facility] Day - Client Nonclinical Telephone Record [facility] Client [facility] Day - Client Client Site [facility] - Day Physician Raechel Ache - MD Contact Type Call Who Is Calling Patient / Member / Family / Caregiver Caller Name Anae Steenson Caller Phone Number [phone] Patient Name Natasha Woodard Call Type Message Only Information Provided Reason for Call Request to Reschedule Office Appointment Initial Comment Caller states she has been seeing Cardiologist, referred back to PCP for lung issues. Scheduled this evening, rescheduled for Friday, she doesn't want to wait until Friday, she can get someone else to drive her in today, if still possible for todays appt. Call Closed By: Kendall Flack Transaction Date/Time: 08/02/2017 8:38:38 AM (ET)

## 2017-08-02 NOTE — Telephone Encounter (Signed)
Patient notified as instructed by telephone and verbalized understanding. Patient stated that she has had these symptoms for about a week and does not feel that she needs to go to the ER. Patient stated that she will keep the appointment that is scheduled for tomorrow. Patient stated that if she get worse before the appointment she will go to the ER.

## 2017-08-02 NOTE — Telephone Encounter (Signed)
I spoke with pt and offered an appt with Dr Alphonsus Sias today at 12:15. Pt said she only wants to see Dr Para March; pt kept appt 08/03/17 at 12:15 with Dr Para March but wants note sent to Dr Para March to see if can work in pt today. Pt did not sound SOB when spoke with her on phone.Please advise.

## 2017-08-03 ENCOUNTER — Encounter: Payer: Self-pay | Admitting: Family Medicine

## 2017-08-03 ENCOUNTER — Ambulatory Visit (INDEPENDENT_AMBULATORY_CARE_PROVIDER_SITE_OTHER): Payer: Medicare Other | Admitting: Family Medicine

## 2017-08-03 VITALS — BP 120/82 | HR 75 | Temp 99.3°F | Wt 177.2 lb

## 2017-08-03 DIAGNOSIS — I420 Dilated cardiomyopathy: Secondary | ICD-10-CM | POA: Diagnosis not present

## 2017-08-03 DIAGNOSIS — R0602 Shortness of breath: Secondary | ICD-10-CM

## 2017-08-03 LAB — BASIC METABOLIC PANEL
BUN: 13 mg/dL (ref 6–23)
CALCIUM: 9.7 mg/dL (ref 8.4–10.5)
CO2: 32 meq/L (ref 19–32)
CREATININE: 0.87 mg/dL (ref 0.40–1.20)
Chloride: 103 mEq/L (ref 96–112)
GFR: 81.21 mL/min (ref >60.00)
GLUCOSE: 90 mg/dL (ref 70–99)
Potassium: 4.1 mEq/L (ref 3.5–5.1)
Sodium: 141 mEq/L (ref 135–145)

## 2017-08-03 MED ORDER — TRAMADOL HCL 50 MG PO TABS
50.0000 mg | ORAL_TABLET | Freq: Two times a day (BID) | ORAL | Status: DC | PRN
Start: 2017-08-03 — End: 2017-08-20

## 2017-08-03 MED ORDER — ENALAPRIL MALEATE 20 MG PO TABS
20.0000 mg | ORAL_TABLET | Freq: Every day | ORAL | Status: DC
Start: 2017-08-03 — End: 2017-11-06

## 2017-08-03 NOTE — Patient Instructions (Signed)
Go to the lab on the way out.  We'll contact you with your lab report. [facility] will call about your referral. Take care.  Glad to see you.  

## 2017-08-03 NOTE — Progress Notes (Addendum)
Shortness of breath and heart failure follow-up. Patient had presented with irregular pulse, that led to cardiac evaluation, which led to echo showing depressed ejection fraction. Follow-up catheterization done. She has been seen multiple times by the cardiac clinic. She's been started on appropriate routine heart failure medications. She has been treated with Lasix recently. She is currently down to 1 tab of lasix daily.  She's been started on lovastatin. Able to tolerate lovastatin at this point.  Unfortunately she also had an orthopedic injury, L arm in brace/sling.  She is trying to recover from that.  The patient was quite shocked to find out that she had heart trouble the first place. Neither the patient nor I expected her to have echo results that she had.  Discussed with patient about systolic dysfunction and heart failure in general, especially dry weight, rationale for medicines, ejection fraction in general.  She understood the rationale for her current medications.  She is still short of breath. She is not getting chest pain. She does not have lower extremity edema. Her weight is decreased in the meantime. Based on her historical weights, she is likely near her dry weight. She weighed 176 pounds back in 2016. She weighs 177.25 today.    PMH and SH reviewed  ROS: Per HPI unless specifically indicated in ROS section   Meds, vitals, and allergies reviewed.   GEN: nad, alert and oriented HEENT: mucous membranes moist NECK: supple w/o LA CV: rrr.  PULM: ctab except for slight decrease in breath sounds at the bases bilaterally, no inc wob ABD: soft, +bs EXT: no edema SKIN: no acute rash but chronic skin changes noted, left arm in sling.

## 2017-08-04 ENCOUNTER — Encounter: Payer: Self-pay | Admitting: Family Medicine

## 2017-08-04 ENCOUNTER — Ambulatory Visit: Payer: Medicare Other | Admitting: Family Medicine

## 2017-08-04 LAB — PRO B NATRIURETIC PEPTIDE: NT-PRO BNP: 3702 pg/mL — AB (ref 0–738)

## 2017-08-04 NOTE — Assessment & Plan Note (Signed)
With a long conversation about her situation in general, and also about the specific points of her care. See above. She understood. I appreciate the help of cardiology. Would recheck labs today. Would continue Lasix once a day for now.  If her weight is going back up in the meantime, then I want her to take an extra dose of Lasix and update either [place] or the cardiac clinic. I will route her labs over to cardiology has an FYI. Patient is still having some shortness of breath. Her weight is lower and she did not have obvious pulmonary abnormality on her chest x-ray, excluding the congestion expected from congestive heart failure. Refer to pulmonary. Rationale discussed with patient. She agrees. >40 minutes spent in face to face time with patient, >50% spent in counselling or coordination of care

## 2017-08-08 ENCOUNTER — Encounter: Payer: Self-pay | Admitting: Physician Assistant

## 2017-08-09 ENCOUNTER — Other Ambulatory Visit: Payer: Self-pay | Admitting: *Deleted

## 2017-08-09 ENCOUNTER — Ambulatory Visit (INDEPENDENT_AMBULATORY_CARE_PROVIDER_SITE_OTHER): Payer: Medicare Other | Admitting: Physician Assistant

## 2017-08-09 VITALS — BP 154/94 | HR 82 | Ht 62.0 in | Wt 174.0 lb

## 2017-08-09 DIAGNOSIS — I5022 Chronic systolic (congestive) heart failure: Secondary | ICD-10-CM

## 2017-08-09 DIAGNOSIS — R0602 Shortness of breath: Secondary | ICD-10-CM

## 2017-08-09 DIAGNOSIS — I10 Essential (primary) hypertension: Secondary | ICD-10-CM

## 2017-08-09 DIAGNOSIS — E7849 Other hyperlipidemia: Secondary | ICD-10-CM

## 2017-08-09 DIAGNOSIS — Z79899 Other long term (current) drug therapy: Secondary | ICD-10-CM

## 2017-08-09 DIAGNOSIS — E119 Type 2 diabetes mellitus without complications: Secondary | ICD-10-CM | POA: Diagnosis not present

## 2017-08-09 DIAGNOSIS — E784 Other hyperlipidemia: Secondary | ICD-10-CM | POA: Diagnosis not present

## 2017-08-09 MED ORDER — CARVEDILOL 12.5 MG PO TABS: 12.5000 mg | ORAL_TABLET | Freq: Two times a day (BID) | ORAL | 2 refills | Status: DC

## 2017-08-09 NOTE — Progress Notes (Signed)
Cardiology Office Note    Date:  08/10/2017   ID:  Natasha Woodard, Natasha Woodard December 18, 1940, MRN [id number]  PCP:  [name], MD  Cardiologist:  Dr. Herbie Baltimore  Chief Complaint  Patient presents with  . Follow-up    seen for Dr. Herbie Baltimore    History of Present Illness:  Natasha Woodard is a 76 y.o. female  with PMH of DM II, HLD intolerant to statins, HTN, and obesity. On the most recent visit, it was noticed herheart rate was in the high 40s. EKG showed PVCs. She was referred to cardiology service for further evaluation. She denies any chest pain or shortness of breath. She denies any low extremity edema, orthopnea or PND. On physical exam, she does have occasional skipped heartbeat. EKG showed she has both PVCs and PACs. The PVCs are generally benign and may not need treatment unless become more frequent. I saw the patient initially on 06/09/2017, because of the PVCs, we obtained an echocardiogram which came back severely abnormal showing EF 25-30%. Her 24-hour Holter monitor obtained on 06/22/2017 showed 16954 ventricular ectopy. There were 16056 single or 2, 380 couplets, 44 runs (longest run 7 beats), 8490 supraventricular beats mostly singlet with 11 runs and longest run 15 beats. I brought the patient back on 06/27/2017, we discussed various options, eventually we decided to proceed with left and right cardiac catheterization. She eventually underwent the planned procedure on 06/30/2017 which showed 55 proximal RCA, 40% proximal left circumflex, 40% OM1 lesion, 40% mid left circumflex lesion, 50% ostial D2 lesion, EF 25-35%. Moderately elevated LVEDP, wedge pressure 16 mmHg. LVEDP 26 mmHg. Post cath, she did see some floaters, this was eventually resolved. She returnedto the ED on 07/11/2017 with a left wrist fracture after falling down several steps.  I saw the patient on 07/14/2017, heart 24 hour heart monitor showed she had very frequent PVCs, I uptitrated her beta blocker by increasing  carvedilol to 6.25 mg twice a day. I discontinue her amlodipine and added spironolactone 12.5 mg daily. She was not on statin at that time, after discussed various options, we also started on lovastatin 20mg  daily. I saw the patient back on 08/01/2017, she was having at least 1+ pitting edema in bilateral lower extremity. There was also diminished breath sounds in the lung with bibasilar crackles well. I started her on diuretic, proBNP was 3543. Potassium level mildly elevated at 5.5. She presents today for follow-up.  Patient presents today for cardiology office visit. Her shortness of breath has improved. Her lung is clear on physical exam. Although she did not notice increased urinary output, her weight did drop by 6 pounds since the last time I saw her and by 9 pounds according to her home scale. Her blood pressure is elevated today, on repeat, her blood pressure remain elevated with systolic blood pressure in the 140s. She says she did take her medication. I will increase her carvedilol to 12.5 mg twice a day. This should complete her heart failure medication titration. I will defer to Dr. Herbie Baltimore to decide whether or not to place her on Entresto. She will be due for repeat echocardiogram in early November, however will defer to Dr. Herbie Baltimore to order it.     Past Medical History:  Diagnosis Date  . Calcific tendonitis 2008   Treatment of left leg  . Cardiomyopathy (HCC)   . CHF (congestive heart failure) (HCC)   . Coronary artery disease    mild-moderate CAD 06/30/17 cath  .  CTS (carpal tunnel syndrome)   . Diabetes mellitus    Type II  . Dysrhythmia    patient said that she cant remember what it is  . Hiatal hernia    with reflux  . Hyperlipidemia    Statin intolerant  . Hypertension   . Ichthyosis congenita   . NSVD (normal spontaneous vaginal delivery) 1971 & 1972  . Obesity   . Plantar fasciitis    Left  . Pulmonary nodule    Imaged multiple times and benign appearing  . Wears  glasses     Past Surgical History:  Procedure Laterality Date  . ABDOMINAL HYSTERECTOMY  1975   TAH-BSO  . ABSCESS DRAINAGE  1972   right breast  . BREAST BIOPSY Left 01/14/2009   Stereo- Benign  . CARPAL TUNNEL RELEASE Right 07/02/2014   Procedure: RIGHT CARPAL TUNNEL RELEASE;  Surgeon: Nicki Reaper, MD;  Location: [facility];  Service: Orthopedics;  Laterality: Right;  . CARPAL TUNNEL RELEASE Left 06/11/2015   Procedure: LEFT CARPAL TUNNEL RELEASE;  Surgeon: Cindee Salt, MD;  Location: [facility];  Service: Orthopedics;  Laterality: Left;  REGIONAL/FAB  . COLONOSCOPY    . corn removal  1968   right  . OPEN REDUCTION INTERNAL FIXATION (ORIF) DISTAL RADIAL FRACTURE Left 07/21/2017   Procedure: OPEN REDUCTION INTERNAL FIXATION (ORIF) LEFT DISTAL RADIAL FRACTURE;  Surgeon: Sheral Apley, MD;  Location: [facility] OR;  Service: Orthopedics;  Laterality: Left;  . RIGHT/LEFT HEART CATH AND CORONARY ANGIOGRAPHY N/A 06/30/2017   Procedure: Right/Left Heart Cath and Coronary Angiography;  Surgeon: Marykay Lex, MD;  Location: [facility];  Service: Cardiovascular;  Laterality: N/A;  . ROTATOR CUFF REPAIR     left shoulder    Current Medications: Outpatient Medications Prior to Visit  Medication Sig Dispense Refill  . acetaminophen (TYLENOL) 325 MG tablet Take 650 mg by mouth every 6 (six) hours as needed.    Marland Kitchen aspirin 81 MG tablet Take 81 mg by mouth daily.      . Cholecalciferol (VITAMIN D) 1000 UNITS capsule Take 1,000 Units by mouth daily.     . enalapril (VASOTEC) 20 MG tablet Take 1 tablet (20 mg total) by mouth daily.    . furosemide (LASIX) 20 MG tablet 2 TABLET BY MOUTH ONCE A DAY FOR 2 DAYS AND THEN 1 TABLET BY MOUTH DAILY 40 tablet 5  . glucose blood (ACCU-CHEK AVIVA PLUS) test strip USE TO TEST BLOOD SUGAR ONCE DAILY FOR DM 250.00 100 each 3  . Lancets (ACCU-CHEK MULTICLIX) lancets USE ONE LANCET TO TEST BLOOD GLUCOSE DAILY FOR DM 250.00 102 each  3  . lovastatin (MEVACOR) 20 MG tablet Take 1 tablet (20 mg total) by mouth at bedtime. 30 tablet 5  . metFORMIN (GLUCOPHAGE) 500 MG tablet Take 0.5 tablets (250 mg total) by mouth 2 (two) times daily with a meal. 90 tablet 3  . Multiple Vitamin (MULTIVITAMIN WITH MINERALS) TABS tablet Take 1 tablet by mouth daily.    Marland Kitchen spironolactone (ALDACTONE) 25 MG tablet Take 0.5 tablets (12.5 mg total) by mouth daily. 15 tablet 5  . traMADol (ULTRAM) 50 MG tablet Take 1 tablet (50 mg total) by mouth 2 (two) times daily as needed for moderate pain.    . carvedilol (COREG) 6.25 MG tablet Take 1 tablet (6.25 mg total) by mouth 2 (two) times daily. 60 tablet 5   No facility-administered medications prior to visit.  Allergies:   Ezetimibe-simvastatin; Lipitor [atorvastatin]; Pravastatin sodium; and Sulfonamide derivatives   Social History   Social History  . Marital status: Married    Spouse name: N/A  . Number of children: 2  . Years of education: N/A   Occupational History  .  Retired   Social History Main Topics  . Smoking status: Former Smoker    Years: 28.00    Types: Cigarettes    Quit date: 12/12/1993  . Smokeless tobacco: Never Used  . Alcohol use No  . Drug use: No  . Sexual activity: No   Other Topics Concern  . None   Social History Narrative   Two kids, two grandchildren.    Married 1969   Retired.      Family History:  The patient's family history includes Cancer in her brother; Diabetes in her father and mother; Heart disease in her brother, father, and mother.   ROS:   Please see the history of present illness.    ROS All other systems reviewed and are negative.   PHYSICAL EXAM:   VS:  BP (!) 154/94   Pulse 82   Ht 5\' 2"  (1.575 m)   Wt 174 lb (78.9 kg)   BMI 31.83 kg/m    GEN: Well nourished, well developed, in no acute distress  HEENT: normal  Neck: no JVD, carotid bruits, or masses Cardiac: RRR; no murmurs, rubs, or gallops,no edema  Respiratory:  clear  to auscultation bilaterally, normal work of breathing GI: soft, nontender, nondistended, + BS MS: no deformity or atrophy  Skin: warm and dry, no rash Neuro:  Alert and Oriented x 3, Strength and sensation are intact Psych: euthymic mood, full affect  Wt Readings from Last 3 Encounters:  08/09/17 174 lb (78.9 kg)  08/03/17 177 lb 4 oz (80.4 kg)  08/01/17 180 lb 3.2 oz (81.7 kg)      Studies/Labs Reviewed:   EKG:  EKG is not ordered today.   Recent Labs: 05/30/2017: ALT 10; TSH 0.74 07/20/2017: Hemoglobin 13.7; Platelets 406 08/03/2017: NT-Pro BNP 3,702 08/09/2017: BUN 20; Creatinine, Ser 0.87; Potassium 4.9; Sodium 142   Lipid Panel    Component Value Date/Time   CHOL 183 09/15/2016 1253   TRIG 129.0 09/15/2016 1253   HDL 39.30 09/15/2016 1253   CHOLHDL 5 09/15/2016 1253   VLDL 25.8 09/15/2016 1253   LDLCALC 118 (H) 09/15/2016 1253   LDLDIRECT 53 08/10/2009    Additional studies/ records that were reviewed today include:   24 hour Holter monitor 06/22/2017 Study Highlights     Mostly sinus rhythm with sinus bradycardia to sinus tachycardia.  Minimum heart rate 45 bpm, maximum heart rate 135 BPM. Average heart rate 85 BPM.  13% ventricular ectopy/PVCs: The majority are singlets. Rare couplets and runs. - Fasted from was 172 bpm. Longest run 7 beats.  6-7% supraventricular ectopy. Majority are singlets. Some pairs and bigeminy. 11 runs -fastest run 166 BPM. Longest run 15 beats.  Significant ventricular and supraventricular ectopy with short nonsustained VT runs as well as PSVT/PAT runs.    Echo 06/22/2017 LV EF: 25% - 30%  Study Conclusions  - Left ventricle: The cavity size was mildly dilated. Wall thickness was normal. Systolic function was severely reduced. The estimated ejection fraction was in the range of 25% to 30%. Diffuse hypokinesis. Doppler parameters are consistent with abnormal left ventricular relaxation (grade 1  diastolic dysfunction). Doppler parameters are consistent with high ventricular filling pressure. - Mitral valve: There was mild  regurgitation. - Left atrium: The atrium was mildly dilated. - Pulmonary arteries: Systolic pressure was moderately increased.  Impressions:  - Severe global reduction in LV systolic function; mild diastolic dysfunction with elevated LV filling pressure; mild LVE; mild MR; mild LAE; mild TR with moderately elevated pulmonary pressure.    Cath 06/30/2017 Conclusion     Prox RCA lesion, 55 %stenosed.  Prox Cx lesion, 40 %stenosed. 1st Mrg lesion, 45 %stenosed. Mid Cx lesion, 50 %stenosed.  Ost 2nd Diag lesion, 50 %stenosed.  There is severe left ventricular systolic dysfunction. The left ventricular ejection fraction is 25-35% by visual estimate.  LV end diastolic pressure is moderately elevated.  Hemodynamic findings consistent with mild pulmonary hypertension.  Mild to moderate multivessel CAD with no obstructive lesions to explain severely reduced EF.  This was suggest nonischemic cardiomyopathy moderate LV filling pressure elevation, but relatively normal right heart pressures. She seems to be relatively euvolemic, but would benefit from afterload reduction and standing oral diuretic.  Plan: We will optimize medical management in the outpatient setting. Patient will be discharged home from postprocedure unit when stable.    Left wrist X ray 07/11/2017 FINDINGS: Comminuted, impacted fracture of the distal radial metaphysis and epiphysis with radial and dorsal displacement and dorsal angulation of the distal fragment. There is also a mildly displaced ulnar styloid fracture.  IMPRESSION: Distal radius and ulnar styloid fractures.     ASSESSMENT:    1. Chronic systolic heart failure (HCC)   2. Encounter for long-term (current) use of medications   3. Other hyperlipidemia   4. SOB (shortness of breath)   5.  Controlled type 2 diabetes mellitus without complication, without long-term current use of insulin (HCC)   6. Essential hypertension      PLAN:  In order of problems listed above:  1. Chronic systolic heart failure: Nonischemic cardiomyopathy based on recent catheterization. Will continue on carvedilol, increased Coreg to 12.5 mg twice a day. She is also on enalapril and spironolactone. I will defer to Dr. Herbie Baltimore to decide whether or not to switch her enalapril to [facility] later. She is due for 3 month repeat echocardiogram in November  2. CAD: Nonobstructive disease on cardiac catheterization  3. Shortness of breath: Likely due to combination of deconditioning and low EF  4. Hyperlipidemia: At lovastatin, due for repeat fasting lipid panel and LFTs in 2 weeks  5. Hypertension: Blood pressure mildly elevated today, increase carvedilol to 12.5 mg twice a day.  6. DM 2: On metformin, with defer to primary care provider.    Medication Adjustments/Labs and Tests Ordered: Current medicines are reviewed at length with the patient today.  Concerns regarding medicines are outlined above.  Medication changes, Labs and Tests ordered today are listed in the Patient Instructions below. Patient Instructions  Medication Instructions:   INCREASE carvedilol to 12.5mg  TWICE DAILY A prescription for the new dose has been sent to your pharmacy.   Labwork:   Your physician recommends that you have labwork today (BMET)  Your physician recommends that you return for lab work in: 2 weeks (fasting cholesterol test)     Testing/Procedures:  none  Follow-Up:  With Dr. Herbie Baltimore as scheduled  If you need a refill on your cardiac medications before your next appointment, please call your pharmacy.      Ramond Dial, [place]  08/10/2017 9:57 PM    [facility] [address] [place], [place], [place]  [address] Phone: [phone]; Fax: [phone]

## 2017-08-09 NOTE — Patient Instructions (Signed)
Medication Instructions:   INCREASE carvedilol to 12.5mg  TWICE DAILY A prescription for the new dose has been sent to your pharmacy.   Labwork:   Your physician recommends that you have labwork today (BMET)  Your physician recommends that you return for lab work in: 2 weeks (fasting cholesterol test)     Testing/Procedures:  none  Follow-Up:  With Dr. Herbie Baltimore as scheduled  If you need a refill on your cardiac medications before your next appointment, please call your pharmacy.

## 2017-08-10 ENCOUNTER — Encounter: Payer: Self-pay | Admitting: Physician Assistant

## 2017-08-10 LAB — BASIC METABOLIC PANEL
BUN / CREAT RATIO: 23 (ref 12–28)
BUN: 20 mg/dL (ref 8–27)
CHLORIDE: 103 mmol/L (ref 96–106)
CO2: 24 mmol/L (ref 20–29)
Calcium: 10.5 mg/dL — ABNORMAL HIGH (ref 8.7–10.3)
Creatinine, Ser: 0.87 mg/dL (ref 0.57–1.00)
GFR calc non Af Amer: 65 mL/min/{1.73_m2} (ref >59)
GFR, EST AFRICAN AMERICAN: 75 mL/min/{1.73_m2} (ref >59)
GLUCOSE: 82 mg/dL (ref 65–99)
Potassium: 4.9 mmol/L (ref 3.5–5.2)
SODIUM: 142 mmol/L (ref 134–144)

## 2017-08-15 ENCOUNTER — Encounter: Payer: Self-pay | Admitting: Family Medicine

## 2017-08-16 ENCOUNTER — Telehealth: Payer: Self-pay | Admitting: Physician Assistant

## 2017-08-16 ENCOUNTER — Encounter: Payer: Self-pay | Admitting: Physician Assistant

## 2017-08-16 NOTE — Telephone Encounter (Signed)
Attempt to call back, no answer and unable to leave VM (VM box full).

## 2017-08-16 NOTE — Telephone Encounter (Signed)
Returned call to patient.    Lab lipid panel 09/2013 and results given to patient.   Patient verbalized understanding.   Patient has active order for fasting lipid panel which she is aware of.

## 2017-08-16 NOTE — Telephone Encounter (Signed)
New message  ° ° ° °Pt is returning Hayley call °

## 2017-08-16 NOTE — Telephone Encounter (Signed)
New Message  Per pt would like to speak with RN about her cholesterol levels and pt would like to know when the last time her levels were taken. Please call back to discuss

## 2017-08-18 NOTE — Progress Notes (Addendum)
[facility] [place] [facility] Consultation      Assessment and Plan:  The patient is a 76 year old female with recently diagnosed systolic congestive heart failure, with dyspnea on exertion.  Dyspnea on exertion.  -We spent a lot of time talking about the patient's dyspnea on exertion, which is multifactorial from congestive heart failure, as well as deconditioning. -I explained that her dyspnea is unlikely to improve without some increase in her baseline exercise level, she is currently doing water aerobics which I explained is unlikely to get her where she wants to be in terms of her dyspnea. -I, therefore explained that the patient would benefit from cardiac rehabilitation, we will refer her for this. -I see no evidence of COPD/emphysema on the patient's chest x-ray, which does show some pulmonary edema. I will send her for pulmonary function tests look for evidence of COPD.  Systolic CHF with reduced ejection fraction.  -I will repeat chest x-ray today to see if her pulmonary edema has improved, on physical exam she has no crackles or lower extremity edema. -Continue to follow up with cardiology. I gave her some information on what congestive heart failure is.   Snoring and excessive daytime sleepiness. -Symptoms and signs of objective sleep apnea. -Spent all of our time today in discussing symptoms of dyspnea, deconditioning and congestive heart failure, therefore we will address possible sleep apnea in subsequent visits.   Date: 08/21/2017  MRN# [id number] NATTALY MUSGROVE 24-Nov-1941    JARYA JURS is a 76 y.o. old female seen in consultation for chief complaint of:    Chief Complaint  Patient presents with  . Advice Only    Referred by Crawford Givens for Sob  . Shortness of Breath    Pt reports she has sob with activity. She states onset after carpal tunnel surgery. She had fluid on lungs. Pt denies cough, chest tightness-pressure and or wheezing.    HPI:   REEANNA TRUSTY is a 76 y.o. female with PMH of systolic congestive heart failure, DM II, HLD intolerant to statins, HTN, and obesity. Patient is a resident of [place], she presents to the [facility] in [place] so that she can be seen sooner. She has been having dyspnea. [facility] She used to also do laps on the walking track for several years and could do a mile until a year and a half ago and could do a mile. Then she switched to pool aerobics at that time. About 2 months ago she went back to the track bout could only do 1 lap and had to stop and rest.  She gets tired when she walks around a [employer] and has to sit down.  She has never been diagnosed with respiratory disease and has never been on an inhaler.   She last smoked a ppd in 2000, mild second hand smoke exposure. 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 .   She is sleep during the day and her husband notes that she snores during the day.   Desat walk on 0/10/18; on RA at rest sat was 94% and HR 84. After walking 360 feet at a moderate pace she had moderate to severe dyspnea. Sat was 94% and HR 107.   **Chest x-ray 08/01/17; cardiomegaly, mild pulmonary edema. **Echo 06/22/2017;  EF: 25% - 30% **Right and left heart cath 06/30/2017 55 proximal RCA, 40% proximal left  circumflex, 40% OM1 lesion, 40% mid left circumflex lesion, 50% ostial D2 lesion, EF 25-35%.  LVEDP, wedge pressure 16 mmHg. LVEDP 26 mmHg.    PMHX:   Past Medical History:  Diagnosis Date  . Calcific tendonitis 2008   Treatment of left leg  . Cardiomyopathy (HCC)   . CHF (congestive heart failure) (HCC)   . Coronary artery disease    mild-moderate CAD 06/30/17 cath  . CTS (carpal tunnel syndrome)   . Diabetes mellitus    Type II  . Dysrhythmia    patient said that she cant remember what it is  . Hiatal hernia    with reflux  . Hyperlipidemia    Statin intolerant  .  Hypertension   . Ichthyosis congenita   . NSVD (normal spontaneous vaginal delivery) 1971 & 1972  . Obesity   . Plantar fasciitis    Left  . Pulmonary nodule    Imaged multiple times and benign appearing  . Wears glasses    Surgical Hx:  Past Surgical History:  Procedure Laterality Date  . ABDOMINAL HYSTERECTOMY  1975   TAH-BSO  . ABSCESS DRAINAGE  1972   right breast  . BREAST BIOPSY Left 01/14/2009   Stereo- Benign  . CARPAL TUNNEL RELEASE Right 07/02/2014   Procedure: RIGHT CARPAL TUNNEL RELEASE;  Surgeon: Nicki Reaper, MD;  Location: [facility];  Service: Orthopedics;  Laterality: Right;  . CARPAL TUNNEL RELEASE Left 06/11/2015   Procedure: LEFT CARPAL TUNNEL RELEASE;  Surgeon: Cindee Salt, MD;  Location: [facility];  Service: Orthopedics;  Laterality: Left;  REGIONAL/FAB  . COLONOSCOPY    . corn removal  1968   right  . OPEN REDUCTION INTERNAL FIXATION (ORIF) DISTAL RADIAL FRACTURE Left 07/21/2017   Procedure: OPEN REDUCTION INTERNAL FIXATION (ORIF) LEFT DISTAL RADIAL FRACTURE;  Surgeon: Sheral Apley, MD;  Location: [facility] OR;  Service: Orthopedics;  Laterality: Left;  . RIGHT/LEFT HEART CATH AND CORONARY ANGIOGRAPHY N/A 06/30/2017   Procedure: Right/Left Heart Cath and Coronary Angiography;  Surgeon: Marykay Lex, MD;  Location: [facility] INVASIVE CV LAB;  Service: Cardiovascular;  Laterality: N/A;  . ROTATOR CUFF REPAIR     left shoulder   Family Hx:  Family History  Problem Relation Age of Onset  . Heart disease Mother        s/p pacemaker  . Diabetes Mother   . Heart disease Father   . Diabetes Father   . Heart disease Brother        MI  . Cancer Brother        Lung  . Colon cancer Neg Hx   . Breast cancer Neg Hx    Social Hx:   Social History  Substance Use Topics  . Smoking status: Former Smoker    Years: 28.00    Types: Cigarettes    Quit date: 12/12/1993  . Smokeless tobacco: Never Used  . Alcohol use No   Medication:     Current Outpatient Prescriptions:  .  acetaminophen (TYLENOL) 325 MG tablet, Take 650 mg by mouth every 6 (six) hours as needed., Disp: , Rfl:  .  aspirin 81 MG tablet, Take 81 mg by mouth daily.  , Disp: , Rfl:  .  carvedilol (COREG) 12.5 MG tablet, Take 1 tablet (12.5 mg total) by mouth 2 (two) times daily., Disp: 60 tablet, Rfl: 2 .  Cholecalciferol (VITAMIN D) 1000 UNITS capsule, Take 1,000 Units by mouth daily. , Disp: , Rfl:  .  enalapril (VASOTEC) 20 MG tablet, Take 1 tablet (20 mg total) by mouth daily., Disp: , Rfl:  .  furosemide (LASIX) 20 MG tablet, 2 TABLET BY MOUTH ONCE A DAY FOR 2 DAYS AND THEN 1 TABLET BY MOUTH DAILY, Disp: 40 tablet, Rfl: 5 .  glucose blood (ACCU-CHEK AVIVA PLUS) test strip, USE TO TEST BLOOD SUGAR ONCE DAILY FOR DM 250.00, Disp: 100 each, Rfl: 3 .  Lancets (ACCU-CHEK MULTICLIX) lancets, USE ONE LANCET TO TEST BLOOD GLUCOSE DAILY FOR DM 250.00, Disp: 102 each, Rfl: 3 .  lovastatin (MEVACOR) 20 MG tablet, Take 1 tablet (20 mg total) by mouth at bedtime., Disp: 30 tablet, Rfl: 5 .  metFORMIN (GLUCOPHAGE) 500 MG tablet, Take 0.5 tablets (250 mg total) by mouth 2 (two) times daily with a meal., Disp: 90 tablet, Rfl: 3 .  Multiple Vitamin (MULTIVITAMIN WITH MINERALS) TABS tablet, Take 1 tablet by mouth daily., Disp: , Rfl:  .  spironolactone (ALDACTONE) 25 MG tablet, Take 0.5 tablets (12.5 mg total) by mouth daily., Disp: 15 tablet, Rfl: 5 .  traMADol (ULTRAM) 50 MG tablet, Take 1 tablet (50 mg total) by mouth 2 (two) times daily as needed for moderate pain., Disp: , Rfl:    Allergies:  Ezetimibe-simvastatin; Lipitor [atorvastatin]; Pravastatin sodium; and Sulfonamide derivatives  Review of Systems: Gen:  Denies  fever, sweats, chills HEENT: Denies blurred vision, double vision. bleeds, sore throat Cvc:  No dizziness, chest pain. Resp:   Denies cough or sputum production, shortness of breath Gi: Denies swallowing difficulty, stomach pain. Gu:  Denies bladder  incontinence, burning urine Ext:   No Joint pain, stiffness. Skin: No skin rash,  hives  Endoc:  No polyuria, polydipsia. Psych: No depression, insomnia. Other:  All other systems were reviewed with the patient and were negative other that what is mentioned in the HPI.   Physical Examination:   VS: BP 136/84 (BP Location: Left Arm, Cuff Size: Normal)   Pulse 93   Resp 16   Ht  (1.575 m)   Wt 171 lb (77.6 kg)   SpO2 97%   BMI 31.28 kg/m   General Appearance: No distress  Neuro:without focal findings,  speech normal. HEENT: PERRLA, EOM intact.   Pulmonary: normal breath sounds, No wheezing.  CardiovascularNormal S1,S2.  No m/r/g.   Abdomen: Benign, Soft, non-tender. Renal:  No costovertebral tenderness  GU:  No performed at this time. Endoc: No evident thyromegaly, no signs of acromegaly. Skin:   warm, no rashes, no ecchymosis  Extremities: normal, no cyanosis, clubbing.  Other findings:    LABORATORY PANEL:   CBC No results for input(s): WBC, HGB, HCT, PLT in the last 168 hours. ------------------------------------------------------------------------------------------------------------------  Chemistries  No results for input(s): NA, K, CL, CO2, GLUCOSE, BUN, CREATININE, CALCIUM, MG, AST, ALT, ALKPHOS, BILITOT in the last 168 hours.  Invalid input(s): GFRCGP ------------------------------------------------------------------------------------------------------------------  Cardiac Enzymes No results for input(s): TROPONINI in the last 168 hours. ------------------------------------------------------------  RADIOLOGY:  No results found.     Thank  you for the consultation and for allowing [facility] to assist in the care of your patient. Our recommendations are noted above.  Please contact us if we can be of further service.   Wells Guiles, MD.  Board Certified in Internal Medicine, Pulmonary Medicine, Critical Care Medicine,  and Sleep Medicine.  [facility] Office Number: [phone]  Santiago Glad, M.D.  Billy Fischer, M.D  08/21/2017

## 2017-08-20 ENCOUNTER — Other Ambulatory Visit: Payer: Self-pay | Admitting: Family Medicine

## 2017-08-21 ENCOUNTER — Ambulatory Visit
Admission: RE | Admit: 2017-08-21 | Discharge: 2017-08-21 | Disposition: A | Discharge status: Home / Self Care | Payer: Medicare Other | Source: Ambulatory Visit | Attending: Internal Medicine | Admitting: Internal Medicine

## 2017-08-21 ENCOUNTER — Other Ambulatory Visit: Payer: Self-pay | Admitting: Family Medicine

## 2017-08-21 ENCOUNTER — Encounter: Payer: Self-pay | Admitting: Internal Medicine

## 2017-08-21 ENCOUNTER — Ambulatory Visit (INDEPENDENT_AMBULATORY_CARE_PROVIDER_SITE_OTHER): Payer: Medicare Other | Admitting: Internal Medicine

## 2017-08-21 VITALS — BP 136/84 | HR 93 | Resp 16 | Ht 62.0 in | Wt 171.0 lb

## 2017-08-21 DIAGNOSIS — I5022 Chronic systolic (congestive) heart failure: Secondary | ICD-10-CM | POA: Diagnosis not present

## 2017-08-21 DIAGNOSIS — I700 Atherosclerosis of aorta: Secondary | ICD-10-CM | POA: Insufficient documentation

## 2017-08-21 DIAGNOSIS — R0609 Other forms of dyspnea: Principal | ICD-10-CM

## 2017-08-21 DIAGNOSIS — I517 Cardiomegaly: Secondary | ICD-10-CM | POA: Insufficient documentation

## 2017-08-21 DIAGNOSIS — R0602 Shortness of breath: Secondary | ICD-10-CM | POA: Diagnosis not present

## 2017-08-21 NOTE — Telephone Encounter (Signed)
Pt walked in requesting to see Dr Para March to get her Tramadol refill now; advised pt Dr Para March is seeing pts and would not be able to see pt right now. Pt said it does not make sense but she took her last tramadol yesterday and she did not realize she was running low on med. Pt request for tramadol to be called in today to [facility]; pt request cb when called in so she can get med picked up. Pt is seeing ortho for fx wrist but has not taken any pain meds from ortho because had tramadol. Pt was apologetic but hopes to get refill tramadol today since out of med.Please advise.

## 2017-08-21 NOTE — Telephone Encounter (Signed)
Please call in.  Thanks.   

## 2017-08-21 NOTE — Telephone Encounter (Signed)
Received refill electronically Last refill 01/31/17 #180/1 refill Last office visit 08/03/17

## 2017-08-21 NOTE — Patient Instructions (Addendum)
--  We will send you for a lung function test, we will see if this is available in [place].   --You have systolic congestive heart failure.   --We will send you for another chest xray today.   --You have Deconditioning (muscle weakness), cardiac rehab will help with this.

## 2017-08-21 NOTE — Telephone Encounter (Signed)
Medication phoned to pharmacy. Tried to phone patient as requested but no answer and mail box is full.

## 2017-08-22 ENCOUNTER — Encounter: Payer: Self-pay | Admitting: Physician Assistant

## 2017-08-23 DIAGNOSIS — S52502D Unspecified fracture of the lower end of left radius, subsequent encounter for closed fracture with routine healing: Secondary | ICD-10-CM | POA: Diagnosis not present

## 2017-08-28 DIAGNOSIS — Z79899 Other long term (current) drug therapy: Secondary | ICD-10-CM | POA: Diagnosis not present

## 2017-08-28 DIAGNOSIS — E784 Other hyperlipidemia: Secondary | ICD-10-CM | POA: Diagnosis not present

## 2017-08-28 LAB — LIPID PANEL
CHOLESTEROL TOTAL: 155 mg/dL (ref 100–199)
Chol/HDL Ratio: 3.8 ratio (ref 0.0–4.4)
HDL: 41 mg/dL (ref >39)
LDL Calculated: 92 mg/dL (ref 0–99)
Triglycerides: 111 mg/dL (ref 0–149)
VLDL Cholesterol Cal: 22 mg/dL (ref 5–40)

## 2017-08-28 LAB — HEPATIC FUNCTION PANEL
ALK PHOS: 58 IU/L (ref 39–117)
ALT: 8 IU/L (ref 0–32)
AST: 14 IU/L (ref 0–40)
Albumin: 4.3 g/dL (ref 3.5–4.8)
BILIRUBIN TOTAL: 0.5 mg/dL (ref 0.0–1.2)
Bilirubin, Direct: 0.15 mg/dL (ref 0.00–0.40)
TOTAL PROTEIN: 7.1 g/dL (ref 6.0–8.5)

## 2017-08-29 ENCOUNTER — Ambulatory Visit (INDEPENDENT_AMBULATORY_CARE_PROVIDER_SITE_OTHER): Payer: Medicare Other | Admitting: Internal Medicine

## 2017-08-29 ENCOUNTER — Other Ambulatory Visit: Payer: Self-pay | Admitting: Physician Assistant

## 2017-08-29 DIAGNOSIS — R0609 Other forms of dyspnea: Secondary | ICD-10-CM | POA: Diagnosis not present

## 2017-08-29 LAB — PULMONARY FUNCTION TEST
FEF 25-75 PRE: 1.96 L/s
FEF 25-75 Post: 2.09 L/sec
FEF2575-%Change-Post: 6 %
FEF2575-%PRED-POST: 162 %
FEF2575-%Pred-Pre: 151 %
FEV1-%CHANGE-POST: 1 %
FEV1-%PRED-POST: 104 %
FEV1-%Pred-Pre: 103 %
FEV1-POST: 1.54 L
FEV1-Pre: 1.52 L
FEV1FVC-%Change-Post: 4 %
FEV1FVC-%Pred-Pre: 113 %
FEV6-%Change-Post: -2 %
FEV6-%PRED-POST: 93 %
FEV6-%PRED-PRE: 96 %
FEV6-POST: 1.71 L
FEV6-PRE: 1.76 L
FEV6FVC-%PRED-POST: 105 %
FEV6FVC-%PRED-PRE: 105 %
FVC-%CHANGE-POST: -2 %
FVC-%PRED-POST: 89 %
FVC-%PRED-PRE: 91 %
FVC-POST: 1.71 L
FVC-PRE: 1.76 L
PRE FEV6/FVC RATIO: 100 %
Post FEV1/FVC ratio: 90 %
Post FEV6/FVC ratio: 100 %
Pre FEV1/FVC ratio: 87 %
RV % PRED: 156 %
RV: 3.48 L
TLC % pred: 106 %
TLC: 5.06 L

## 2017-08-29 MED ORDER — LOVASTATIN 40 MG PO TABS: 40.0000 mg | ORAL_TABLET | Freq: Every day | ORAL | 1 refills | Status: DC

## 2017-08-29 NOTE — Progress Notes (Signed)
PFT done today. Katie Welchel,CMA  

## 2017-08-30 ENCOUNTER — Other Ambulatory Visit: Payer: Self-pay | Admitting: Physician Assistant

## 2017-08-30 ENCOUNTER — Telehealth: Payer: Self-pay | Admitting: Cardiology

## 2017-08-30 DIAGNOSIS — I5022 Chronic systolic (congestive) heart failure: Secondary | ICD-10-CM

## 2017-08-30 NOTE — Telephone Encounter (Signed)
New message   Pt states she wants to let someone know she has lost a lot of weight (down to 165) and is concerned that it is due to the medications she is on.

## 2017-08-30 NOTE — Telephone Encounter (Signed)
S/w pt she states she spoke with Wynema Birch and will be in next wed for lab draw

## 2017-08-30 NOTE — Telephone Encounter (Signed)
S/w pt she states that she had s/w Natasha Woodard yesterday and she states that she forgot to tell him that her weight is down she states that last time she was here her weight was (shoes and clothes on) 181# and now at her home scale she is down 16# to 165 (w/o shoes and clothes) she states that she is taking carvedilol, enalapril, lasix, lovastatin and spironolactone-as ordered. Denies any sx-SOB, Chest pain or pressure, swelling, cough, etc... She states that she she very happy with this weight loss and thinks that she can stop the lasix, she would like Hao to call her back today. Pt notified he is seeing pt's she states she will wait for him to call about "something else"

## 2017-08-30 NOTE — Telephone Encounter (Signed)
She is concerned she is losing weight, I told her she will need a BMET next Wednesday before her appt next Friday. If her renal function worsen, we can potentially change lasix to as needed.   Ramond Dial PA Pager: [phone]

## 2017-09-04 DIAGNOSIS — M25539 Pain in unspecified wrist: Secondary | ICD-10-CM | POA: Diagnosis not present

## 2017-09-04 DIAGNOSIS — M25639 Stiffness of unspecified wrist, not elsewhere classified: Secondary | ICD-10-CM | POA: Diagnosis not present

## 2017-09-05 ENCOUNTER — Other Ambulatory Visit: Payer: Self-pay

## 2017-09-05 MED ORDER — GLUCOSE BLOOD VI STRP: ORAL_STRIP | 1 refills | Status: DC

## 2017-09-05 NOTE — Telephone Encounter (Signed)
Pt is starting to ck BS more often and request refill of accu chek aviva plus test strips to [facility]; refilled per protocol and pt voiced understanding.

## 2017-09-06 ENCOUNTER — Other Ambulatory Visit: Payer: Self-pay

## 2017-09-06 DIAGNOSIS — E785 Hyperlipidemia, unspecified: Secondary | ICD-10-CM

## 2017-09-06 DIAGNOSIS — Z79899 Other long term (current) drug therapy: Secondary | ICD-10-CM | POA: Diagnosis not present

## 2017-09-06 LAB — BASIC METABOLIC PANEL
BUN / CREAT RATIO: 18 (ref 12–28)
BUN: 16 mg/dL (ref 8–27)
CO2: 22 mmol/L (ref 20–29)
CREATININE: 0.9 mg/dL (ref 0.57–1.00)
Calcium: 9.9 mg/dL (ref 8.7–10.3)
Chloride: 102 mmol/L (ref 96–106)
GFR, EST AFRICAN AMERICAN: 71 mL/min/{1.73_m2} (ref >59)
GFR, EST NON AFRICAN AMERICAN: 62 mL/min/{1.73_m2} (ref >59)
Glucose: 97 mg/dL (ref 65–99)
Potassium: 4.9 mmol/L (ref 3.5–5.2)
Sodium: 141 mmol/L (ref 134–144)

## 2017-09-08 ENCOUNTER — Encounter: Payer: Self-pay | Admitting: Cardiology

## 2017-09-08 ENCOUNTER — Ambulatory Visit (INDEPENDENT_AMBULATORY_CARE_PROVIDER_SITE_OTHER): Payer: Medicare Other | Admitting: Cardiology

## 2017-09-08 VITALS — BP 140/74 | HR 78 | Ht 62.0 in | Wt 170.0 lb

## 2017-09-08 DIAGNOSIS — I420 Dilated cardiomyopathy: Secondary | ICD-10-CM

## 2017-09-08 DIAGNOSIS — I10 Essential (primary) hypertension: Secondary | ICD-10-CM

## 2017-09-08 DIAGNOSIS — E784 Other hyperlipidemia: Secondary | ICD-10-CM

## 2017-09-08 DIAGNOSIS — I5042 Chronic combined systolic (congestive) and diastolic (congestive) heart failure: Secondary | ICD-10-CM | POA: Diagnosis not present

## 2017-09-08 DIAGNOSIS — I493 Ventricular premature depolarization: Secondary | ICD-10-CM

## 2017-09-08 DIAGNOSIS — E7849 Other hyperlipidemia: Secondary | ICD-10-CM

## 2017-09-08 DIAGNOSIS — I5032 Chronic diastolic (congestive) heart failure: Secondary | ICD-10-CM | POA: Insufficient documentation

## 2017-09-08 DIAGNOSIS — R0609 Other forms of dyspnea: Secondary | ICD-10-CM

## 2017-09-08 MED ORDER — CARVEDILOL 6.25 MG PO TABS: 6.2500 mg | ORAL_TABLET | Freq: Two times a day (BID) | ORAL | 11 refills | Status: DC

## 2017-09-08 NOTE — Patient Instructions (Signed)
LABS IN DEC 2018  LIPIDS CMP DO NOT EAT OR DRINK THE MORNING OF THE TEST  MAY COME TO OFFICE - NO APPOINTMENT NEEDED  Medication changes In addition 6.25 mg of carvedilol twice a day along with 12.5 mg twice a day ( total 18.75 mg  Twice a day.   Sliding scale Lasix: Weigh yourself when you get home, then Daily in the Morning. Your dry weight will be what your scale says on the day you return home.(here is *165 lbs.).   If you gain more than 3 pounds from dry weight: Increase the Lasix dosing to 20 mg in the morning and 20 mg in the afternoon until weight returns to baseline dry weight.  If weight gain is greater than 5 pounds in 2 days: Increased to Lasix 40 mg in the morning and 20 mg in the evening a day and contact the office for further assistance if weight does not go down the next day.  If the weight goes down more than 3 pounds from dry weight: Hold Lasix until it returns to baseline dry weight   SCHEDULE AT [address] IN NOV 2018  Your physician has requested that you have an echocardiogram. Echocardiography is a painless test that uses sound waves to create images of your heart. It provides your doctor with information about the size and shape of your heart and how well your heart's chambers and valves are working. This procedure takes approximately one hour. There are no restrictions for this procedure.   Your physician recommends that you schedule a follow-up appointment in DEC 2018 WITH DR HARDING.

## 2017-09-08 NOTE — Progress Notes (Addendum)
PCP: [name], MD  Clinic Note: Chief Complaint  Patient presents with  . Follow-up    2-3 months  . Cardiomyopathy    Combined HF    HPI: Natasha Woodard is a 76 y.o. female with a PMH below who presents today for one month follow-up for systolic heart failure. Natasha Woodard was initially evaluated by Mr. Azalee Course, [place] earlier this summer and was noted to have frequent PVCs on EKG with a low resting heart rate. This was evaluated with an echocardiogram that showed an EF of 20-30% and a Holter monitor which revealed significant amount of ventricular ectopy. She was also referred for right and left heart catheterization back in July. Azalee Course, [place] has seen the patient twice since her cardiac catheterization medication adjustments and she currently presents on carvedilol, enalapril as well as spironolactone  along with standing dose of Lasix daily.  Natasha Woodard was last seen on 08/09/2017 by Azalee Course, PA -> she noted having insignificant improvement in her dyspnea. Lungs were clear and she appeared euvolemic. She dropped 6 pounds from her previous visit. Her carvedilol dose was increased to 12.5 mg twice a day. Plan is to consider conversion to [facility] - but likely depending on what the follow-up echocardiogram shows in November.  Recent Hospitalizations: For cardiac catheterization in July August 10 - she had a fall with left wrist fracture, she also suffered some excoriation on the left arm.  Studies Personally Reviewed - (if available, images/films reviewed: From Epic Chart or Care Everywhere)  Holter monitor July 2018: ~17,000 PVC beats - majority were singlets, some couplets. 44 brief 3-7 beat runs of NSVT. Also noted were less frequent PACs with 11 runs (longest 15 beats)  Transthoracic echo 06/22/2017: EF 25 and 30%. GR 1 DD. Mild diastolic dysfunction with elevated LVEDP. Mild valvular disease.  Right & Left Heart Catheterization 06/30/2017: pRCA 55%, pCx 40%, OM1 45%, mCx  50%, D2 50% - LVEF 25-35%. Moderately elevated LVEDP(26 mmHg with PCWP 16 mmHg).  FICK CO/CI: 4.47/2.48. PA pressures 47/14 mmHg with a mean of 27 mm.  Diagnostic Diagram     Interval History: Natasha Woodard is a very time-consuming patient appears very pleasant, but has multiple questions and seems to not understand the first one or 2 times and things were explained. She seems to have poor memory recall of instructions. However she comes in today noting that her dyspnea symptoms have really improved. She describes her breathing has been "excellent". She is able to now walk maybe a mile without stopping, denies usually because of arthritis pains. She goes about 10-15 minutes without feeling tired now. Her weight here is 170 pounds, but at home is 165 pounds.  She sleeps with 2 pillows for comfort/GERD symptoms, but not for dyspnea. No PND, and her edema is well-controlled. She really has not felt a lot of palpitations, no lightheadedness, dizziness or syncope/near syncope. She has not had chest tightness or pressure since her initial diagnosis. She's had some left arm discomfort but that's more related to her fall.  She has not had any syncope or near-syncope Symptoms, no TIA or amaurosis fugax.   No melena, hematochezia, hematuria, or epstaxis. No claudication.  ROS: A comprehensive was performed. Review of Systems  Constitutional: Negative for malaise/fatigue (Energy is getting better).  HENT: Negative for congestion and nosebleeds.   Respiratory: Negative for cough, sputum production, shortness of breath and wheezing.   Cardiovascular: Negative for leg swelling (Minimal ankle edema).  Gastrointestinal: Negative  for blood in stool and melena.  Genitourinary: Negative for hematuria.  Musculoskeletal: Negative for joint pain and myalgias.       Left arm seems to be healing from her fall and injury.  Psychiatric/Behavioral: Negative for depression and memory loss (I does seem to have a true "memory  loss ", but has a hard time remembering things mentioned to her.). The patient is nervous/anxious.   All other systems reviewed and are negative.    I have reviewed and (if needed) personally updated the patient's problem list, medications, allergies, past medical and surgical history, social and family history.   Past Medical History:  Diagnosis Date  . Calcific tendonitis 2008   Treatment of left leg  . Cardiomyopathy (HCC) 06/2017   Echo: EF 25 and 30%. GR 1 DD. Mild diastolic dysfunction with elevated LVEDP. Mild valvular disease.  . Chronic combined systolic and diastolic CHF, NYHA class 2 and ACA/AHA stage C (HCC)   . Coronary artery disease, non-occlusive    mild-moderate CAD 06/30/17 cath  . CTS (carpal tunnel syndrome)   . Diabetes mellitus    Type II  . Dysrhythmia    patient said that she cant remember what it is  . Hiatal hernia    with reflux  . Hyperlipidemia    Statin intolerant  . Hypertension   . Ichthyosis congenita   . NSVD (normal spontaneous vaginal delivery) 1971 & 1972  . Obesity   . Plantar fasciitis    Left  . Pulmonary nodule    Imaged multiple times and benign appearing  . Wears glasses     Past Surgical History:  Procedure Laterality Date  . ABDOMINAL HYSTERECTOMY  1975   TAH-BSO  . ABSCESS DRAINAGE  1972   right breast  . BREAST BIOPSY Left 01/14/2009   Stereo- Benign  . CARPAL TUNNEL RELEASE Right 07/02/2014   Procedure: RIGHT CARPAL TUNNEL RELEASE;  Surgeon: Nicki Reaper, MD;  Location: [facility];  Service: Orthopedics;  Laterality: Right;  . CARPAL TUNNEL RELEASE Left 06/11/2015   Procedure: LEFT CARPAL TUNNEL RELEASE;  Surgeon: Cindee Salt, MD;  Location: [facility];  Service: Orthopedics;  Laterality: Left;  REGIONAL/FAB  . COLONOSCOPY    . corn removal  1968   right  . Holter Monitor  06/2017   ~17,000 PVC beats - majority were singlets, some couplets. 44 brief 3-7 beat runs of NSVT. Also noted were less  frequent PACs with 11 runs (longest 15 beats)  . OPEN REDUCTION INTERNAL FIXATION (ORIF) DISTAL RADIAL FRACTURE Left 07/21/2017   Procedure: OPEN REDUCTION INTERNAL FIXATION (ORIF) LEFT DISTAL RADIAL FRACTURE;  Surgeon: Sheral Apley, MD;  Location: [facility] OR;  Service: Orthopedics;  Laterality: Left;  . RIGHT/LEFT HEART CATH AND CORONARY ANGIOGRAPHY N/A 06/30/2017   Procedure: Right/Left Heart Cath and Coronary Angiography;  Surgeon: Marykay Lex, MD;  Location: [facility] INVASIVE CV LAB:  pRCA 55%, pCx 40%, OM1 45%, mCx 50%, D2 50% - LVEF 25-35%. Moderately elevated LVEDP(26 mmHg with PCWP 16 mmHg).  FICK CO/CI: 4.47/2.48. PA pressures 47/14 mmHg with a mean of 27 mm.  Marland Kitchen ROTATOR CUFF REPAIR     left shoulder  . TRANSTHORACIC ECHOCARDIOGRAM  06/2017   EF 25 and 30%. GR 1 DD. Mild diastolic dysfunction with elevated LVEDP. Mild valvular disease.      Current Meds  Medication Sig  . acetaminophen (TYLENOL) 325 MG tablet Take 650 mg by mouth every 6 (six) hours as needed.  Marland Kitchen  aspirin 81 MG tablet Take 81 mg by mouth daily.    . Cholecalciferol (VITAMIN D) 1000 UNITS capsule Take 1,000 Units by mouth daily.   . enalapril (VASOTEC) 20 MG tablet Take 1 tablet (20 mg total) by mouth daily.  . furosemide (LASIX) 20 MG tablet 2 TABLET BY MOUTH ONCE A DAY FOR 2 DAYS AND THEN 1 TABLET BY MOUTH DAILY  . glucose blood (ACCU-CHEK AVIVA PLUS) test strip USE TO TEST BLOOD SUGAR ONCE DAILY FOR DM E11.9  . Lancets (ACCU-CHEK MULTICLIX) lancets USE ONE LANCET TO TEST BLOOD GLUCOSE DAILY FOR DM 250.00  . lovastatin (MEVACOR) 40 MG tablet Take 1 tablet (40 mg total) by mouth at bedtime.  . metFORMIN (GLUCOPHAGE) 500 MG tablet Take 0.5 tablets (250 mg total) by mouth 2 (two) times daily with a meal.  . Multiple Vitamin (MULTIVITAMIN WITH MINERALS) TABS tablet Take 1 tablet by mouth daily.  Marland Kitchen spironolactone (ALDACTONE) 25 MG tablet Take 0.5 tablets (12.5 mg total) by mouth daily.  . traMADol (ULTRAM) 50 MG tablet TAKE  1 TABLET BY MOUTH EVERY 12 HOURS AS NEEDED  . [DISCONTINUED] carvedilol (COREG) 12.5 MG tablet Take 1 tablet (12.5 mg total) by mouth 2 (two) times daily.    Allergies  Allergen Reactions  . Ezetimibe-Simvastatin Other (See Comments)    REACTION: Muscle aches (side effect)  . Lipitor [Atorvastatin] Other (See Comments)    Leg weakness   . Pravastatin Sodium Other (See Comments)    REACTION: Muscle aches (side effect)  . Sulfonamide Derivatives Nausea And Vomiting    Social History   Social History  . Marital status: Married    Spouse name: N/A  . Number of children: 2  . Years of education: N/A   Occupational History  .  Retired   Social History Main Topics  . Smoking status: Former Smoker    Years: 28.00    Types: Cigarettes    Quit date: 12/12/1993  . Smokeless tobacco: Never Used  . Alcohol use No  . Drug use: No  . Sexual activity: No   Other Topics Concern  . None   Social History Narrative   Two kids, two grandchildren.    Married 1969   Retired.     family history includes Cancer in her brother; Diabetes in her father and mother; Heart disease in her brother, father, and mother.  Wt Readings from Last 3 Encounters:  09/08/17 170 lb (77.1 kg)  08/21/17 171 lb (77.6 kg)  08/09/17 174 lb (78.9 kg)    PHYSICAL EXAM BP 140/74   Pulse 78   Ht  (1.575 m)   Wt 170 lb (77.1 kg)   BMI 31.09 kg/m  Physical Exam  Constitutional: She appears well-developed and well-nourished. No distress.  HENT:  Head: Normocephalic and atraumatic.  Mouth/Throat: Oropharyngeal exudate present.  Eyes: Pupils are equal, round, and reactive to light. EOM are normal.  Neck: Normal range of motion. Neck supple. No JVD present.  Cardiovascular: Normal heart sounds and normal pulses.  PMI is not displaced.  Exam reveals no gallop (Cannot exclude soft S4 gallop).   No murmur heard. Pulmonary/Chest: Effort normal and breath sounds normal. No respiratory distress. She has no  wheezes.  Abdominal: Soft. Bowel sounds are normal. She exhibits no distension. There is no tenderness. There is no rebound.  Musculoskeletal: Normal range of motion. She exhibits no edema.  Slow, steady gait  Skin: Skin is warm and dry. No rash noted. No erythema.  She has scaly skin in both the upper and lower extremities. No obvious bruising, but she does have skin tears  Psychiatric: She has a normal mood and affect. Her behavior is normal. Judgment and thought content normal.  He is a poor grasp on new information  Nursing note and vitals reviewed.    Adult ECG Report n/a  Other studies Reviewed: Additional studies/ records that were reviewed today include:  Recent Labs:    Lab Results  Component Value Date   CREATININE 0.90 09/06/2017   BUN 16 09/06/2017   NA 141 09/06/2017   K 4.9 09/06/2017   CL 102 09/06/2017   CO2 22 09/06/2017   Lab Results  Component Value Date   CHOL 155 08/28/2017   HDL 41 08/28/2017   LDLCALC 92 08/28/2017   LDLDIRECT 53 08/10/2009   TRIG 111 08/28/2017   CHOLHDL 3.8 08/28/2017  - has not taken lovastatin since Sept 3.   ASSESSMENT / PLAN: Problem List Items Addressed This Visit    Chronic combined systolic and diastolic heart failure (HCC) (Chronic)    Overall, she sees relatively euvolemic. Her breathing seems to be stabilized now. She has no orthopnea or PND. Her blood pressure still is a little higher than we like. Plan: Increase carvedilol to 18.75 mg twice a day. Continue current dose of enalapril. -- I'm not sure how much benefit we get from switching her to [facility] at this point. My inclination now will be to recheck an echocardiogram in late November timeframe and determine our follow-up treatment based on that. If her EF is improved, then I would not consider it necessary to use Entresto.   Continue current furosemide 20 mg daily with additional dose when necessary. We discussed sliding scale.   Sliding scale Lasix: Weigh  yourself when you get home, then Daily in the Morning. Your dry weight will be what your scale says on the day you return home.(here is *165 lbs.).   If you gain more than 3 pounds from dry weight: Increase the Lasix dosing to 20 mg in the morning and 20 mg in the afternoon until weight returns to baseline dry weight.  If weight gain is greater than 5 pounds in 2 days: Increased to Lasix 40 mg in the morning and 20 mg in the evening a day and contact the office for further assistance if weight does not go down the next day.  If the weight goes down more than 3 pounds from dry weight: Hold Lasix until it returns to baseline dry weight       Relevant Orders   ECHOCARDIOGRAM COMPLETE   Comprehensive metabolic panel   Dilated cardiomyopathy (HCC) - Primary (Chronic)    She is now taking once daily Lasix with when necessary dosing per sliding scale. X line currently on beta blocker and ACE inhibitor with consideration for Entresto depending on what follow-up echo looks like. I will increase her carvedilol 18.75 mg twice a day.  She is on standing dose of spironolactone which could also be increased along with standing dose Lasix. She sees relatively euvolemic today.  She could still have dyspnea from her reduced ejection fraction, especially exertional dyspnea. I would suspect her exertional dyspnea could be from her cardiomyopathy as long as her EF remains reduced.      Relevant Orders   ECHOCARDIOGRAM COMPLETE   Comprehensive metabolic panel   Dyspnea on exertion (Chronic)    Dyspnea on exertion with an EF of 25-30% is to be expected.  She now seems to be doing better however being able to walk 15 minutes without stopping. This was suggest that we have started appropriate management for cardiomyopathy.  Plans to reassess echocardiogram in little over a month and then reevaluate management. Discussed sliding scale Lasix.      Essential hypertension (Chronic)    With her reduced ejection  fraction, her blood pressure still above goal. Plan: Increase carvedilol 18.75 mg twice a day along with current dose of enalapril and spironolactone..      Frequent PVCs (Chronic)    These nausea be bothering her that much. She is on a beta blocker which we're increasing the dose. I wonder if there could be some PVC component causing her cardiomyopathy versus PVCs being because of the cardiaomyopathy.  Pending reevaluation of EF, with probably consider EP consultation to discuss the possibility of ICD and potentially PVC ablation.      Relevant Orders   Comprehensive metabolic panel   HLD (hyperlipidemia)    Labs are being monitored by PCP. She is not taking lovastatin. She is reluctant to take medications and we will check her lipids along with her chemistry panel prior her follow-up visit.      Relevant Orders   Lipid panel   Comprehensive metabolic panel     Prolonged patient visit - ~45 min due to complex case & multiple ?s -- for every suggested intervention - ~3-4 ?s.   >50% of the time was with direct patient interaction, education & examination.  Current medicines are reviewed at length with the patient today. (+/- concerns) none The following changes have been made: - increase Carvedilol to 18.75 mg bid - consider Entresto pending f/u Echo results in Nov.  Check labs   Patient Instructions  LABS IN DEC 2018  LIPIDS CMP DO NOT EAT OR DRINK THE MORNING OF THE TEST  MAY COME TO OFFICE - NO APPOINTMENT NEEDED  Medication changes In addition 6.25 mg of carvedilol twice a day along with 12.5 mg twice a day ( total 18.75 mg  Twice a day.   Sliding scale Lasix: Weigh yourself when you get home, then Daily in the Morning. Your dry weight will be what your scale says on the day you return home.(here is *165 lbs.).   If you gain more than 3 pounds from dry weight: Increase the Lasix dosing to 20 mg in the morning and 20 mg in the afternoon until weight returns to baseline  dry weight.  If weight gain is greater than 5 pounds in 2 days: Increased to Lasix 40 mg in the morning and 20 mg in the evening a day and contact the office for further assistance if weight does not go down the next day.  If the weight goes down more than 3 pounds from dry weight: Hold Lasix until it returns to baseline dry weight   SCHEDULE AT [address] IN NOV 2018  Your physician has requested that you have an echocardiogram. Echocardiography is a painless test that uses sound waves to create images of your heart. It provides your doctor with information about the size and shape of your heart and how well your heart's chambers and valves are working. This procedure takes approximately one hour. There are no restrictions for this procedure.   Your physician recommends that you schedule a follow-up appointment in DEC 2018 WITH DR Lorenza Winkleman.    Studies Ordered:   Orders Placed This Encounter  Procedures  . Lipid panel  .  Comprehensive metabolic panel  . ECHOCARDIOGRAM COMPLETE      Bryan Lemma, M.D., M.S. Interventional Cardiologist   Pager # [phone] Phone # [phone] [address] [place], [place] [address]

## 2017-09-10 ENCOUNTER — Encounter: Payer: Self-pay | Admitting: Cardiology

## 2017-09-11 ENCOUNTER — Telehealth: Payer: Self-pay | Admitting: Cardiology

## 2017-09-11 DIAGNOSIS — R0609 Other forms of dyspnea: Secondary | ICD-10-CM

## 2017-09-11 DIAGNOSIS — I420 Dilated cardiomyopathy: Secondary | ICD-10-CM

## 2017-09-11 DIAGNOSIS — I5042 Chronic combined systolic (congestive) and diastolic (congestive) heart failure: Secondary | ICD-10-CM

## 2017-09-11 MED ORDER — CARVEDILOL 6.25 MG PO TABS
18.7500 mg | ORAL_TABLET | Freq: Two times a day (BID) | ORAL | 1 refills | Status: DC
Start: 2017-09-11 — End: 2018-01-26

## 2017-09-11 NOTE — Assessment & Plan Note (Addendum)
Overall, she sees relatively euvolemic. Her breathing seems to be stabilized now. She has no orthopnea or PND. Her blood pressure still is a little higher than we like. Plan: Increase carvedilol to 18.75 mg twice a day. Continue current dose of enalapril. -- I'm not sure how much benefit we get from switching her to [facility] at this point. My inclination now will be to recheck an echocardiogram in late November timeframe and determine our follow-up treatment based on that. If her EF is improved, then I would not consider it necessary to use Entresto.   Continue current furosemide 20 mg daily with additional dose when necessary. We discussed sliding scale.   Sliding scale Lasix: Weigh yourself when you get home, then Daily in the Morning. Your dry weight will be what your scale says on the day you return home.(here is *165 lbs.).   If you gain more than 3 pounds from dry weight: Increase the Lasix dosing to 20 mg in the morning and 20 mg in the afternoon until weight returns to baseline dry weight.  If weight gain is greater than 5 pounds in 2 days: Increased to Lasix 40 mg in the morning and 20 mg in the evening a day and contact the office for further assistance if weight does not go down the next day.  If the weight goes down more than 3 pounds from dry weight: Hold Lasix until it returns to baseline dry weight

## 2017-09-11 NOTE — Telephone Encounter (Signed)
1& 2. I spent 40 minutes with her in the room is very simple she takes at 12.5 mg tablet and a 6.25 mg tablet together twice a day. The point of titrating of the medications because her pump function is bad as were supposed to do. She does not need to come back and be seen.  3.  I listened to her with a stethoscope for long time because this the first time I listened to her/him with a stethoscope. I was listening for abnormal heart sounds. If I heard any abnormal odor told her.  4.  This is not the first time she has her defibrillator before either. I brought up to her because this is something we will discuss based upon her low heart function -- depending on what her follow-up echocardiogram looks like.  5.  She does not need to come and talk anymore with me because I spent plenty of time (close to 45 min) talking with her in clinic last week. Things were explained to her in very much detail and is not that much complication with it.  6. The reason for checking another echocardiogram is to see what her pump function currently looks like on the current medications I explained this to her as well.  8.  I did not order PFTs, I told her the pulmonologist in that he would review that with her.   This patient has spent well over the usual amount of time for multiple visits with myself and Azalee Course, PA. We have tried our best to explain these things to her. As soon as I start explaining things she stopped listening and does not understand. Coming back in for another visit will not help her understand. Medications are titrated to treat her condition that we have explained to her as cardiomyopathy/heart failure reduced heart function. Medications she is on, that we are titrating up are to treat this. A defibrillator is something we would consider if her pump function stays low because this is the standard of care and we will discuss it in detail if appropriate. I did not want to talk about it until we get to  that point.  I did not leave any details out about her health. I explained it all to her. Bryan Lemma, MD

## 2017-09-11 NOTE — Assessment & Plan Note (Signed)
These nausea be bothering her that much. She is on a beta blocker which we're increasing the dose. I wonder if there could be some PVC component causing her cardiomyopathy versus PVCs being because of the cardiaomyopathy.  Pending reevaluation of EF, with probably consider EP consultation to discuss the possibility of ICD and potentially PVC ablation.

## 2017-09-11 NOTE — Assessment & Plan Note (Signed)
With her reduced ejection fraction, her blood pressure still above goal. Plan: Increase carvedilol 18.75 mg twice a day along with current dose of enalapril and spironolactone.Marland Kitchen

## 2017-09-11 NOTE — Assessment & Plan Note (Signed)
Dyspnea on exertion with an EF of 25-30% is to be expected. She now seems to be doing better however being able to walk 15 minutes without stopping. This was suggest that we have started appropriate management for cardiomyopathy.  Plans to reassess echocardiogram in little over a month and then reevaluate management. Discussed sliding scale Lasix.

## 2017-09-11 NOTE — Telephone Encounter (Signed)
New message    Pt is calling stating that she saw Dr. Herbie Baltimore on Friday. She has a lot of questions and would like a call back.

## 2017-09-11 NOTE — Telephone Encounter (Signed)
Called left message to call back - in regards to her questions. Left message will be back in office 09/13/17

## 2017-09-11 NOTE — Assessment & Plan Note (Signed)
She is now taking once daily Lasix with when necessary dosing per sliding scale. X line currently on beta blocker and ACE inhibitor with consideration for Entresto depending on what follow-up echo looks like. I will increase her carvedilol 18.75 mg twice a day.  She is on standing dose of spironolactone which could also be increased along with standing dose Lasix. She sees relatively euvolemic today.  She could still have dyspnea from her reduced ejection fraction, especially exertional dyspnea. I would suspect her exertional dyspnea could be from her cardiomyopathy as long as her EF remains reduced.

## 2017-09-11 NOTE — Assessment & Plan Note (Signed)
Labs are being monitored by PCP. She is not taking lovastatin. She is reluctant to take medications and we will check her lipids along with her chemistry panel prior her follow-up visit.

## 2017-09-11 NOTE — Telephone Encounter (Signed)
Returned call to patient. She saw Dr. Herbie Baltimore on Friday 9/28. She has a lot of questions after her visit. She would like to talk directly to him or schedule an appt with him. Advised that I would be able to take down her questions/concerns and pass them along  Questions: 1. Confused about changes in her medications (changed in August and changed again 9/28) & reason why they were changed - she does not follow how she is to take her medications - carvedilol?  2. She states her carvedilol has changed and she is confused if she has the right medication - advised that she take her pills to her pharmacy with her med list and have pharmacist review (she is confused on her instructions for this med) - explained to patient that she has 2 strengths of carvedilol (6.25mg  + 12.5mg ) and she is to take each strength in the morning and again 12 hours later -- she was on carvedilol 6.25mg  BID as on 8/21 and on 8/29 her dose was increased to 12.5mg  BID and was then increased again on 9/28 to 18.75mg  BID (6.25mg  + 12.5mg  - 2 different tablets) -- sent in Rx for carvedilol 6.25mg  (take 3 tablets or 18.75mg ) BID to simplify for patient  3. She states he listened to her with the stethoscope longer than normal - would like to know what he heard, etc  4. Dr. Herbie Baltimore mentioned "defibrillator" to her and she would like to know why this was brought up  5. Is there something she is NOT doing w/her health?  6. Echocardiogram was ordered - she states she just had one done recently and would like explanation this needs to be repeated  7. She states MD asked if she had a home BP cuff - she does not - recommended an arm BP cuff/Omron brand  8. PFT results - she would like info on this  Routed questions/concerns to [facility] RN/Harding MD  Total time: 

## 2017-09-13 ENCOUNTER — Encounter: Payer: Self-pay | Admitting: Family Medicine

## 2017-09-13 ENCOUNTER — Ambulatory Visit (INDEPENDENT_AMBULATORY_CARE_PROVIDER_SITE_OTHER): Payer: Medicare Other | Admitting: Family Medicine

## 2017-09-13 VITALS — BP 118/70 | HR 72 | Temp 98.6°F | Wt 167.8 lb

## 2017-09-13 DIAGNOSIS — E119 Type 2 diabetes mellitus without complications: Secondary | ICD-10-CM | POA: Diagnosis not present

## 2017-09-13 DIAGNOSIS — I5042 Chronic combined systolic (congestive) and diastolic (congestive) heart failure: Secondary | ICD-10-CM

## 2017-09-13 NOTE — Progress Notes (Signed)
She is off statin for now.  She didn't have ADE on statin but opted off med with f/u lipids pending per cards. D/w pt.    Diabetes:  Using medications without difficulties: yes Hypoglycemic episodes:no Hyperglycemic episodes:no Feet problems:no tingling in the toes.  Blood Sugars averaging: ~100 eye exam within last year:yes  Some tingling in the legs B in the middle of the night.  In the ankles but not at the toes.  Not in the day.  No weakness.  This may be positional, d/w pt.    Her breathing is clearly better in the meantime.  She is planning a trip to [place]. No CP.  Complaint with CHF meds.  D/w pt about in detail about meds and rationale for use.    Flu shot d/w pt.  [facility] be [facility] at city clinic per patient preference.   Patient had questions about PT and prev wrist surgery, see after visit summary.  PMH and SH reviewed  Meds, vitals, and allergies reviewed.   ROS: Per HPI unless specifically indicated in ROS section   GEN: nad, alert and oriented HEENT: mucous membranes moist NECK: supple w/o LA CV: rrr. PULM: ctab, no inc wob ABD: soft, +bs EXT: no edema Chronic skin changes noted at baseline Surgery site on the left wrist has normally healed skin with small scar noted.

## 2017-09-13 NOTE — Patient Instructions (Addendum)
Call the ortho clinic and see if you have any remaining restrictions.  If you can't get to PT multiple times per week, then ask the physical therapist if you can go in for episodic visits (around every two weeks) with them sending you home with "homework". Go to the lab on the way out.  We'll contact you with your lab report. Take care.  Glad to see you.  Plan on a recheck in about 3 months.   Enjoy the trip to [place].

## 2017-09-14 LAB — HEMOGLOBIN A1C: Hgb A1c MFr Bld: 6.3 % (ref 4.6–6.5)

## 2017-09-14 MED ORDER — ADULT MULTIVITAMIN W/MINERALS CH: 1.0000 | ORAL_TABLET | Freq: Every day | ORAL | Status: DC

## 2017-09-14 MED ORDER — ASPIRIN 81 MG PO TABS
81.0000 mg | ORAL_TABLET | Freq: Every day | ORAL | Status: DC
Start: 2017-09-14 — End: 2023-02-19

## 2017-09-14 MED ORDER — LOVASTATIN 40 MG PO TABS: ORAL_TABLET | ORAL | Status: DC

## 2017-09-14 NOTE — Telephone Encounter (Signed)
New message     Patient is returning Sharon's call

## 2017-09-14 NOTE — Assessment & Plan Note (Signed)
Her breathing is clearly better in the meantime. Rationale for current medications and doses discussed with patient. She is off statin currently with plans for follow-up lipids per cardiology later in the year. See above. No chest pain. No leg swelling. >40 minutes spent in face to face time with patient, >50% spent in counselling or coordination of care.

## 2017-09-14 NOTE — Assessment & Plan Note (Addendum)
A1c pending. No change in meds. Rationale for labs discussed with patient. Flu shot to be done at the city clinic per her preference. Okay for outpatient follow-up.  Tingling she has in her ankles only happens in the middle of the night. Sounds to be a positional issue. She does not have typical diabetic distal foot neuropathic symptoms.

## 2017-09-15 NOTE — Telephone Encounter (Signed)
F/u message ° °Pt returning RN call .please call back to discuss  °

## 2017-09-19 ENCOUNTER — Telehealth (HOSPITAL_COMMUNITY): Payer: Self-pay | Admitting: *Deleted

## 2017-09-19 NOTE — Telephone Encounter (Signed)
-----   Message from Marykay Lex, MD sent at 09/19/2017 10:48 AM EDT ----- Regarding: RE: Ok to proceed with cardiac rehab Yes  DH ----- Message ----- From: [name] Aus, RN Sent: 09/17/2017   5:35 PM To: Marykay Lex, MD Subject: Rip Harbour to proceed with cardiac rehab               Dr. Herbie Baltimore,  The above patient was referred by Dr. Nicholos Johns at [facility] for cardiac rehab with the diagnosis of systolic heart failure.  This was routed to pulmonary (since the referring department was a Pulmonary MD)  Upon closer review pt would qualify for cardiac rehab since her latest EF is less than 35%.  Ms. Hebbard was seen in your office by you on 10/1 previous appt were with Azalee Course.  Based upon your assessment and evaluation, is this patient appropriate for cardiac rehab from a cardiology standpoint?  If so, would you be willing to offer assistance should we need any for cardiac issues that may arise during exercise?  Thank you for the input Alanson Aly, BSN Cardiac and Pulmonary Rehab Nurse Navigator

## 2017-09-21 ENCOUNTER — Telehealth (HOSPITAL_COMMUNITY): Payer: Self-pay | Admitting: *Deleted

## 2017-09-21 NOTE — Telephone Encounter (Signed)
-----   Message from Sheral Apley, MD sent at 09/21/2017  7:23 AM EDT ----- Regarding: RE: ok to participate in cardaic rehab No restricions   ----- Message ----- From: [name] Aus, RN Sent: 09/17/2017   5:46 PM To: Sheral Apley, MD Subject: ok to participate in cardaic rehab             Dr. Eulah Pont,  The above patient is being referred to cardiac rehab for systolic heart failure.  Pt had on 8/10 open reduction internal fixation (ORIF) to left distal radial fracture by you.    Cardiac rehab is three times a week and  comprises of 30 minutes of aerobic activity on various equipment such as stationary/recumbent bike, recumbent stepper, treadmill/track and arm ergometer.  We do use light hand weights 2-10 pounds on Mondays and Fridays. We also do gentle warm up and cool down stretches. Based upon pt recovery and post assessments, may pt participate in group exercise in cardiac rehab?  If so, any restrictions of activity or limitations with exercise we should observe?  Thank you for your input Karlene Lineman RN, BSN Cardiac and Pulmonary Rehab Nurse Navigator

## 2017-09-22 ENCOUNTER — Telehealth (HOSPITAL_COMMUNITY): Payer: Self-pay

## 2017-09-22 NOTE — Telephone Encounter (Signed)
Called Patient to discuss Cardiac Rehab - Left voicemail to call back.

## 2017-09-25 MED ORDER — SPIRONOLACTONE 25 MG PO TABS: 12.5000 mg | ORAL_TABLET | Freq: Every day | ORAL | 3 refills | Status: DC

## 2017-09-25 MED ORDER — FUROSEMIDE 20 MG PO TABS
ORAL_TABLET | ORAL | 3 refills | Status: DC
Start: 2017-09-25 — End: 2018-01-26

## 2017-09-25 NOTE — Telephone Encounter (Addendum)
Patient walked into discuss the information. RN ANSWERED ALL QUESTIONS PATIENT TOOK NOTES.  REFERENCE  INFO GIVEN[ heart failure , cariomyopathy, "how to prevent heart failure" Patient is instructed in possible [facility] assistig if there is not a charge.  patient comprehend some of information ,but RN thinks patient needs reinforcement. Referral for [facility]. REFILL DONE. RN DISCUSS WITH PATIENT FOR @45  MIN

## 2017-09-26 ENCOUNTER — Other Ambulatory Visit: Payer: Self-pay | Admitting: *Deleted

## 2017-09-26 NOTE — Patient Outreach (Signed)
[facility]) Care Management  09/26/2017  Natasha Woodard 12-27-1940 [id number]  Telephone Screen  Referral Date: 09/26/17 Referral Source: [facility]  Referral Reason: Patient having a hard time understanding diagnosis, medication, and treatment. Insurance: [facility] Medicare  Outreach phone call to patient. HIPAA identifiers verified. Referral received from MD's office for patient. Per referral, patient is having a difficult time understanding her diagnosis of heart failure. Patient confirmed she lacks knowledge about heart failure, medications, and symptoms of heart failure. Patient was newly diagnosed in June 2018 with heart failure. Patient's ejection fraction is 25 -30%, per EMR. Patient doesn't understand the reasons for her low heart rate, shortness of breath, and weakness. Patient stated, she and the nurse at Dr. Elissa Hefty office kept missing each other's phone calls. Patient stated, she gathered her medications and went to Dr. Elissa Hefty office on an unscheduled visit to get answers/clarification. Patient stated, the nurse did a good job explaining how heart failure relates to her diagnosis. Patient verbalized having a better understanding about heart failure. She wants to be able to manage her diagnosis of heart failure. Patient asked can she continue to exercise, results to her pulmonary test, and is heart failure hereditary. RN CM Dorann Lodge) explained to patient about the benefits of staying active. RN CM educated patient that heart failure could be caused by several factors including genetics. Patient stated, majority of the women in her family has died from heart disease. Patient reported, she is not taking her Atorvastatin. She stated, she stopped taking this particular medication, because she was taking to many medications. Patient stated, someone told her to drink eggplant water, which would decrease her cholesterol level. Patient stated, she drinks 3 eight ounces bottles/cups per  day and other liquids throughout the day. Patient stated, she was told about limiting her fluids to 32 ounces per day. Patient reported, she thought the fluid restrictions were only for a short term. She doesn't monitor her fluid intake, currently. Patient stated, she weighs daily and her last weight was 164 pounds.   Plan:  RN CM advised patient to contact RNCM for any needs or concerns. RN CM will send referral to [facility] RN for further in home eval/assessment of care needs and management of chronic conditions.   Wynelle Cleveland, RN, BSN, MHA/MSL, [facility] Telephonic Care Manager Coordinator [facility] Direct Phone: [phone] Toll Free: [phone] Fax: [phone]

## 2017-09-27 DIAGNOSIS — S52502D Unspecified fracture of the lower end of left radius, subsequent encounter for closed fracture with routine healing: Secondary | ICD-10-CM | POA: Diagnosis not present

## 2017-09-28 DIAGNOSIS — M25632 Stiffness of left wrist, not elsewhere classified: Secondary | ICD-10-CM | POA: Diagnosis not present

## 2017-09-28 DIAGNOSIS — M25642 Stiffness of left hand, not elsewhere classified: Secondary | ICD-10-CM | POA: Diagnosis not present

## 2017-09-29 NOTE — Telephone Encounter (Signed)
Attempted to call patient in regards to Cardiac Rehab - Left message on voicemail °

## 2017-10-03 ENCOUNTER — Encounter: Payer: Self-pay | Admitting: Family Medicine

## 2017-10-03 ENCOUNTER — Other Ambulatory Visit: Payer: Self-pay

## 2017-10-03 ENCOUNTER — Ambulatory Visit (INDEPENDENT_AMBULATORY_CARE_PROVIDER_SITE_OTHER): Payer: Medicare Other | Admitting: Family Medicine

## 2017-10-03 DIAGNOSIS — I5042 Chronic combined systolic (congestive) and diastolic (congestive) heart failure: Secondary | ICD-10-CM

## 2017-10-03 DIAGNOSIS — L989 Disorder of the skin and subcutaneous tissue, unspecified: Secondary | ICD-10-CM | POA: Diagnosis not present

## 2017-10-03 DIAGNOSIS — R0683 Snoring: Secondary | ICD-10-CM

## 2017-10-03 NOTE — Progress Notes (Signed)
R hand, palm 2x35mm dark oval lesion proximal to the R thumb.  No known trauma. No other bruising.  Present about 1 week.    She has been snoring more in the meantime, since dx of heart failure. Her sister occ heard hear stop breathing one night on a trip.    She was asking if she should see the heart failure clinic. Discussed with her cardiac care in general, along with the possibility and rationale for seeing the heart failure clinic. All discussed in detail.  Flu shot done last week, 09/28/17.    Meds, vitals, and allergies reviewed.   ROS: Per HPI unless specifically indicated in ROS section   nad ncat rrr ctab Abd soft Ext w/o edema.  R hand, palm 2x62mm dark oval lesion proximal to the R thumb. Under magnification this looks to be a small blister with a small amount of blood collected underneath it, which can happen when the skin is pinched. It does not look like a nevus.

## 2017-10-03 NOTE — Patient Outreach (Signed)
Telephone assessment/new referral:  Placed call to patient to discuss [facility] services.  No answer. Left a message requesting a return call.  PLAN: Will continue outreach efforts.  Rowe Pavy, RN, BSN, CEN [facility] [employer] [phone]

## 2017-10-03 NOTE — Patient Instructions (Addendum)
This looks like a small amount of blood under a small blister.  It should gradually get better.  If not better in the next 10 days, then let me know.   Let me talk to pulmonary about sleep apnea and cardiology about the heart failure clinic.  Take care.  Glad to see you.

## 2017-10-04 ENCOUNTER — Other Ambulatory Visit: Payer: Self-pay

## 2017-10-04 NOTE — Patient Outreach (Signed)
New referral: Attempted to reach patient. Unsuccessful.   PLAN: Will continue outreach efforts.  Rowe Pavy, RN, BSN, CEN [facility] [employer] [phone]

## 2017-10-05 ENCOUNTER — Other Ambulatory Visit: Payer: Self-pay

## 2017-10-05 ENCOUNTER — Telehealth: Payer: Self-pay | Admitting: *Deleted

## 2017-10-05 DIAGNOSIS — L989 Disorder of the skin and subcutaneous tissue, unspecified: Secondary | ICD-10-CM | POA: Insufficient documentation

## 2017-10-05 DIAGNOSIS — G4731 Primary central sleep apnea: Secondary | ICD-10-CM | POA: Insufficient documentation

## 2017-10-05 DIAGNOSIS — G4719 Other hypersomnia: Secondary | ICD-10-CM

## 2017-10-05 NOTE — Patient Outreach (Signed)
Telephone assessment for new referral:  Placed call to patient. No response but an immediate text message saying she will call back.  PLAN: Will await a return call.  Rowe Pavy, RN, BSN, CEN [facility] [employer] [phone]

## 2017-10-05 NOTE — Assessment & Plan Note (Signed)
I will check with cardiology to see if patient should go to the heart failure clinic. Discussed with patient in detail about the rationale for heart failure treatment and the heart failure clinic. >25 minutes spent in face to face time with patient, >50% spent in counselling or coordination of care.

## 2017-10-05 NOTE — Assessment & Plan Note (Signed)
I will last for pulmonary input regarding possible sleep apnea testing.

## 2017-10-05 NOTE — Telephone Encounter (Signed)
Split night ordered per DR. Nothing further needed.

## 2017-10-05 NOTE — Assessment & Plan Note (Signed)
This looks like a small blood blister and should resolve. Update me if not resolving. No intervention needed.

## 2017-10-05 NOTE — Telephone Encounter (Signed)
-----   Message from Shane Crutch, MD sent at 10/05/2017 11:09 AM EDT ----- Yes, we had discussed it at that last visit but she was a bit overwhelmed with all the other things we talked about so I deferred the test.   Misty, can you please put in for a split night, and follow up in 3 months, I have documented excessive daytime sleepiness in the note. ----- Message ----- From: [name], MD Sent: 10/05/2017   7:49 AM To: Shane Crutch, MD  Patient was on vacation with her sister, and her sister noted her snoring and possibly having sleep apnea. Can you get her in for testing? Thanks.  Natasha Woodard

## 2017-10-06 ENCOUNTER — Encounter (HOSPITAL_COMMUNITY): Payer: Self-pay

## 2017-10-06 NOTE — Telephone Encounter (Signed)
3rd attempt to call patient in regards to Cardiac Rehab - LM on VM. Sending letter.

## 2017-10-09 ENCOUNTER — Other Ambulatory Visit: Payer: Self-pay

## 2017-10-09 DIAGNOSIS — H903 Sensorineural hearing loss, bilateral: Secondary | ICD-10-CM | POA: Diagnosis not present

## 2017-10-09 NOTE — Patient Outreach (Signed)
Telephone assessment:  Placed 4th call to patient. Unsuccessfully.    PLAN: Will Higher education careers adviser. If no response in 10 days will close.  Rowe Pavy, RN, BSN, CEN [facility] [employer] [phone]

## 2017-10-09 NOTE — Patient Outreach (Signed)
Telephone assessment:  Incoming call from patient who reports that she does not answer a phone number that she does not recognize.  Reports that she is not ready to make an appointment yet for a home visit. I reviewed in detail how the [facility] program works and how this program is a benefit of her insurance and is free for her.  She again wishes to discuss with her MD and call me back.  Patient has questions about referral to heart failure clinic and what stage of heart failure that she has. I encouraged patient to speak with her MD about this.  PLAN: will await a phone call back from patient about interest in booking a home visit to provide education on heart failure.  Provided my contact information and phone number.  Rowe Pavy, RN, BSN, CEN [facility] [employer] [phone]

## 2017-10-11 ENCOUNTER — Other Ambulatory Visit: Payer: Self-pay

## 2017-10-11 NOTE — Patient Outreach (Signed)
Telephone assessment: Incoming call from patient who reports that she would like a home visit for assistance with CHF.   Offered home visit for 10/16/2017 and patient has accepted. Confirmed address.  PLAN: Home visit on 10/16/2017.  Rowe Pavy, RN, BSN, CEN [facility] [employer] [phone]

## 2017-10-16 ENCOUNTER — Other Ambulatory Visit: Payer: Self-pay

## 2017-10-16 DIAGNOSIS — E119 Type 2 diabetes mellitus without complications: Secondary | ICD-10-CM | POA: Diagnosis not present

## 2017-10-16 NOTE — Patient Outreach (Signed)
[facility]) Care Management   10/16/2017  Natasha Woodard 1941-11-18 [id number]  Natasha Woodard is an 76 y.o. female  Initial home visit 10:00 am  Subjective: Patient reports that she had a rough summer. Reports fall and new diagnosis of heart failure.  Patient reports that she weighs daily and keep a log on her phone. Reports weight range from 165-163. Knows action plan to take for weight gain. Reports that she was directed to take an extra lasix for weight gain of greater than 3 pounds overnight or 5 pounds in a week.  [place]  Reports 15 pound weigh loss since new diagnosis of heart failure.  Patient reports that her DM is under control with a recent A1c of 6.3. Patient is on oral medications and monitors CBG daily. Also keeps this record in her phone.   CBG  88-102..  Yesterday reading 98. Has not checked CBG today.    Objective:  Awake and alert. Very pleasant.  Vitals:   10/16/17 1013  BP: 138/84  Pulse: 81  Resp: 16  SpO2: 97%  Weight: 163 lb 6.4 oz (74.1 kg)  Height: 1.575 m (5\' 2" )   Review of Systems  Constitutional: Negative.   Eyes:       Reports poor vision  Respiratory: Negative.   Cardiovascular: Negative.   Gastrointestinal: Negative.   Genitourinary: Negative.   Musculoskeletal: Positive for joint pain.       Wrist and shoulder  Skin: Negative.   Neurological: Negative.   Endo/Heme/Allergies: Negative.   Psychiatric/Behavioral: Negative.     Physical Exam  Constitutional: She is oriented to person, place, and time. She appears well-developed and well-nourished.  Cardiovascular: Normal rate, normal heart sounds and intact distal pulses.  Respiratory: Effort normal and breath sounds normal.  GI: Soft. Bowel sounds are normal.  Musculoskeletal: Normal range of motion. She exhibits edema.  Trace of edema  Neurological: She is alert and oriented to person, place, and  time.  Skin: Skin is warm and dry.  Feet without lesions.   Psychiatric: She has a normal mood and affect. Her behavior is normal. Judgment and thought content normal.    Encounter Medications:   Outpatient Encounter Medications as of 10/16/2017  Medication Sig  . acetaminophen (TYLENOL) 325 MG tablet Take 650 mg by mouth every 6 (six) hours as needed.  Marland Kitchen. aspirin 81 MG tablet Take 1 tablet (81 mg total) by mouth daily.  . carvedilol (COREG) 6.25 MG tablet Take 3 tablets (18.75 mg total) by mouth 2 (two) times daily.  . Cholecalciferol (VITAMIN D) 1000 UNITS capsule Take 1,000 Units by mouth daily.   . enalapril (VASOTEC) 20 MG tablet Take 1 tablet (20 mg total) by mouth daily.  . furosemide (LASIX) 20 MG tablet Take 20 mg tablet by mouth daily, may take an additional  20 mg per increase daily weight instruction as needed  . glucose blood (ACCU-CHEK AVIVA PLUS) test strip USE TO TEST BLOOD SUGAR ONCE DAILY FOR DM E11.9  . Lancets (ACCU-CHEK MULTICLIX) lancets USE ONE LANCET TO TEST BLOOD GLUCOSE DAILY FOR DM 250.00  . metFORMIN (GLUCOPHAGE) 500 MG tablet Take 0.5 tablets (250 mg total) by mouth 2 (two) times daily with a meal.  . spironolactone (ALDACTONE) 25 MG tablet Take 0.5 tablets (12.5 mg total) by mouth daily.  . traMADol (ULTRAM) 50 MG tablet TAKE 1 TABLET BY MOUTH EVERY 12 HOURS  AS NEEDED  . lovastatin (MEVACOR) 40 MG tablet Off med as of 09/13/17 (Patient not taking: Reported on 10/16/2017)  . Multiple Vitamin (MULTIVITAMIN WITH MINERALS) TABS tablet Take 1 tablet by mouth daily. (Patient not taking: Reported on 10/16/2017)   No facility-administered encounter medications on file as of 10/16/2017.     Functional Status:   In your present state of health, do you have any difficulty performing the following activities: 10/16/2017 07/20/2017  Hearing? Malvin Johns  Vision? Y N  Difficulty concentrating or making decisions? N N  Walking or climbing stairs? N N  Dressing or bathing? N Y  Doing  errands, shopping? N N  Preparing Food and eating ? N -  Using the Toilet? N -  In the past six months, have you accidently leaked urine? N -  Do you have problems with loss of bowel control? N -  Managing your Medications? N -  Comment iuses a pill box -  Managing your Finances? N -  Housekeeping or managing your Housekeeping? N -  Some recent data might be hidden    Fall/Depression Screening:    Fall Risk  10/16/2017 09/26/2017 09/15/2016  Falls in the past year? Yes Yes No  Number falls in past yr: 1 1 -  Injury with Fall? Yes Yes -  Risk Factor Category  High Fall Risk - -  Risk for fall due to : History of fall(s) - -  Follow up Falls prevention discussed Falls evaluation completed -   PHQ 2/9 Scores 10/16/2017 09/26/2017 09/15/2016 08/19/2014 07/30/2013  PHQ - 2 Score 0 0 0 0 0    Assessment:   (1) reviewed [facility] program and provided new patient packet. Provided [facility] calendar, magnet, and my card. Reviewed consent and patient signed consent. (2) new diagnosis of heart failure.   Currently weighing daily and following low salt diet.  (3) exercising 3 days per week. (4) DM:  A1c  6.3 (5) one recent fall   Plan:  (1) consent signed and scanned into chart. (2) Provided education about heart failure. Reviewed low salt tear off poster provided to patient. Reviewed heart failure zones. Provided Living well with heart failure book. Reviewed action plan for weight gain (3) encouraged patient to exercising 5 times per week if possible. (4) encouraged patient to continue to self monitor and take mediations as prescribed. (5) reviewed fall precautions.  Next home visit planned for 11/17/2017.  Appointment and goals written in [facility] calendar for patient. Goal setting and care planning during home visit and primary goal is to increase knowledge of heart failure management. This note and barrier letter sent to MD.  [facility] CM Care Plan Problem One     Most Recent Value  Care Plan Problem One   Knowledge deficit related to new diagnosis of heart failure. as evidence by patients request for more knowledge.  Role Documenting the Problem One  Care Management Coordinator  Care Plan for Problem One  Active  [facility] Long Term Goal   Patient will verbalize increase knowledge of self management of heart failure  [facility] Long Term Goal Start Date  10/16/17  Interventions for Problem One Long Term Goal  Reviewed [facility] program. discussed goals and plan of care. Reviewed importance of daily weights and keeping a log. Reviewed low salt diet. reviewed benefits of exercise.   [facility] CM Short Term Goal #1   Patient will report exercising 5 days a week for the next 30 days.   [facility] CM Short Term  Goal #1 Start Date  10/16/17  Interventions for Short Term Goal #1  Reviewed the importance of daily exercise. Encouraged patient to exercise for 5 days per week.  [facility] CM Short Term Goal #2   Patient will report reading heart failure educational material before in the next home i visit in 30 days.   [facility] CM Short Term Goal #2 Start Date  10/16/17  Interventions for Short Term Goal #2  Provided Heart failure packet. Provided written heart failure zones.  Encouraged patient to read. will reassess knowledge at next home visit.       Rowe Pavy, RN, BSN, CEN [facility] [employer] [phone]

## 2017-10-20 ENCOUNTER — Ambulatory Visit: Payer: Medicare Other

## 2017-10-20 DIAGNOSIS — S52502D Unspecified fracture of the lower end of left radius, subsequent encounter for closed fracture with routine healing: Secondary | ICD-10-CM | POA: Diagnosis not present

## 2017-10-20 NOTE — Telephone Encounter (Signed)
No response from patient in regards to participating in Cardiac Rehab - Closed referral.

## 2017-10-23 DIAGNOSIS — M25532 Pain in left wrist: Secondary | ICD-10-CM | POA: Diagnosis not present

## 2017-10-23 DIAGNOSIS — R531 Weakness: Secondary | ICD-10-CM | POA: Diagnosis not present

## 2017-10-23 DIAGNOSIS — M25632 Stiffness of left wrist, not elsewhere classified: Secondary | ICD-10-CM | POA: Diagnosis not present

## 2017-10-23 DIAGNOSIS — S52502E Unspecified fracture of the lower end of left radius, subsequent encounter for open fracture type I or II with routine healing: Secondary | ICD-10-CM | POA: Diagnosis not present

## 2017-10-24 ENCOUNTER — Other Ambulatory Visit: Payer: Self-pay | Admitting: Family Medicine

## 2017-10-26 ENCOUNTER — Telehealth: Payer: Self-pay | Admitting: *Deleted

## 2017-10-26 ENCOUNTER — Other Ambulatory Visit: Payer: Self-pay

## 2017-10-26 ENCOUNTER — Ambulatory Visit (HOSPITAL_COMMUNITY): Payer: Medicare Other | Attending: Cardiology

## 2017-10-26 DIAGNOSIS — I340 Nonrheumatic mitral (valve) insufficiency: Secondary | ICD-10-CM | POA: Insufficient documentation

## 2017-10-26 DIAGNOSIS — I5042 Chronic combined systolic (congestive) and diastolic (congestive) heart failure: Secondary | ICD-10-CM

## 2017-10-26 DIAGNOSIS — I420 Dilated cardiomyopathy: Secondary | ICD-10-CM

## 2017-10-26 LAB — ECHOCARDIOGRAM COMPLETE
EERAT: 7.57
EWDT: 201 ms
FS: 21 % — AB (ref 28–44)
IVS/LV PW RATIO, ED: 0.57
LA diam end sys: 48 mm
LA diam index: 2.63 cm/m2
LA vol A4C: 106 ml
LA vol index: 55.8 mL/m2
LASIZE: 48 mm
LAVOL: 102 mL
LV E/e' medial: 7.57
LV PW d: 13 mm — AB (ref 0.6–1.1)
LV TDI E'LATERAL: 10.3
LV TDI E'MEDIAL: 3.4
LV e' LATERAL: 10.3 cm/s
LVEEAVG: 7.57
LVOT VTI: 19.3 cm
LVOT peak grad rest: 3 mmHg
LVOTPV: 91 cm/s
MV Dec: 201
MV Peak grad: 2 mmHg
MV pk A vel: 88.4 m/s
MVPKEVEL: 78 m/s
RV LATERAL S' VELOCITY: 7.02 cm/s
RV sys press: 36 mmHg
Reg peak vel: 289 cm/s
TR max vel: 289 cm/s

## 2017-10-26 NOTE — Telephone Encounter (Signed)
-----   Message from Marykay Lex, MD sent at 10/10/2017  6:06 PM EDT ----- Tyler Deis, MD  ----- Message ----- From: [name], MD Sent: 10/10/2017   5:58 AM To: Marykay Lex, MD  Please refer her. Thanks.  Clelia Croft  ----- Message ----- From: Marykay Lex, MD Sent: 10/09/2017  10:23 PM To: [name], MD  It would probably not be a bad idea -it seems like I have to explain things at least 3 times every visit. If she would be willing to do that I would be happy to refer her to the heart failure clinic. Bryan Lemma, MD  ----- Message ----- From: [name], MD Sent: 10/05/2017   7:48 AM To: Marykay Lex, MD  Should this patient go to the heart failure clinic? She was asking about this. I will defer to you. I would appreciate any input. Thanks.  Clelia Croft

## 2017-10-30 ENCOUNTER — Ambulatory Visit: Payer: Medicare Other

## 2017-10-30 ENCOUNTER — Other Ambulatory Visit: Payer: Medicare Other

## 2017-10-31 ENCOUNTER — Telehealth: Payer: Self-pay | Admitting: *Deleted

## 2017-10-31 NOTE — Telephone Encounter (Signed)
F/U call: ° °Patient returning your call  °

## 2017-10-31 NOTE — Telephone Encounter (Signed)
Left message to call back - in regards to echo report.  Also to let patient know a referral has been placed for to see someone in the heart failure clinic. Continue to see  [facility] team,  If appointment for heart failure clinic is near appointment time with Dr Herbie Baltimore on 11/30/17-- keep appointment with HEART FAILURE clinic and post pone appointment with DR [facility].

## 2017-10-31 NOTE — Telephone Encounter (Signed)
-----   Message from Marykay Lex, MD sent at 10/29/2017 12:15 PM EST ----- Overall, the current echocardiogram shows stable reduced function.  Ejection fraction is still in the 30-35% range.  Indicating no improved function.  The goal now is to continue to treat to help efficiency.  As a result of this and how complicated it is explain things, I have referred Ms. Caban to our advanced heart failure clinic.  This will allow for more time for education and management.  Closer follow-ups are more available.Bryan Lemma, MD

## 2017-10-31 NOTE — Telephone Encounter (Signed)
SPOKE TO PATIENT . VERBALIZED UNDERSTANDING. AWARE SHE WILL BE GETTING A CALL FOR HEART FAILURE CLINIC PATIENT VERY APPRECIATIVE

## 2017-11-06 ENCOUNTER — Ambulatory Visit (INDEPENDENT_AMBULATORY_CARE_PROVIDER_SITE_OTHER): Payer: Medicare Other | Admitting: Family Medicine

## 2017-11-06 ENCOUNTER — Encounter: Payer: Self-pay | Admitting: Family Medicine

## 2017-11-06 DIAGNOSIS — E119 Type 2 diabetes mellitus without complications: Secondary | ICD-10-CM | POA: Diagnosis not present

## 2017-11-06 DIAGNOSIS — Z0000 Encounter for general adult medical examination without abnormal findings: Secondary | ICD-10-CM

## 2017-11-06 DIAGNOSIS — Z7189 Other specified counseling: Secondary | ICD-10-CM

## 2017-11-06 DIAGNOSIS — I5042 Chronic combined systolic (congestive) and diastolic (congestive) heart failure: Secondary | ICD-10-CM | POA: Diagnosis not present

## 2017-11-06 NOTE — Patient Instructions (Addendum)
Please consider restarting lovastatin in the meantime.  Please call ortho and tell them you still have pain.    Check with your insurance to see if they will cover the tetanus shot. Recheck here in about 3 months, labs ahead of time.  Take care.  Glad to see you.

## 2017-11-06 NOTE — Progress Notes (Signed)
I have personally reviewed the Medicare Annual Wellness questionnaire and have noted 1. The patient's medical and social history 2. Their use of alcohol, tobacco or illicit drugs 3. Their current medications and supplements 4. The patient's functional ability including ADL's, fall risks, home safety risks and hearing or visual             impairment. 5. Diet and physical activities 6. Evidence for depression or mood disorders  The patients weight, height, BMI have been recorded in the chart and visual acuity is per eye clinic.  I have made referrals, counseling and provided education to the patient based review of the above and I have provided the pt with a written personalized care plan for preventive services.  Provider list updated- see scanned forms.  Routine anticipatory guidance given to patient.  See health maintenance. The possibility exists that previously documented standard health maintenance information may have been brought forward from a previous encounter into this note.  If needed, that same information has been updated to reflect the current situation based on today's encounter.    [facility] fall  Shingles 2008  PNA 2016  Tetanus 2008, d/w pt.   Colonoscopy 2014  Breast cancer screening up to date.  DXA 2015  Advance directive- husband designated if patient were incapacitated.  Cognitive function addressed- see scanned forms- and if abnormal then additional documentation follows.   Encouraged her to consider OSA testing.  D/w pt about working on weight.  Her husband isn't hearing her stop breathing  She isn't waking up from snoring.    Still in therapy for hand pain.  She has talked to ortho about it.  Encouraged her to f/u with ortho.    HLD.  She took lovastatin for about 1 week but stopped it, no ADE on med.  D/w pt about restart.  She has labs pending with cardiology.    She is taking tramadol for for aches w/o ADE.    CHF.   She is still doing 5 days a week at the pool with aerobics.  She is doing well in the class "once I get there and get started."  Her breathing is better then prev, "100% better than it was."   PMH and SH reviewed  Meds, vitals, and allergies reviewed.   ROS: Per HPI.  Unless specifically indicated otherwise in HPI, the patient denies:  General: fever. Eyes: acute vision changes ENT: sore throat Cardiovascular: chest pain Respiratory: SOB GI: vomiting GU: dysuria Musculoskeletal: acute back pain Derm: acute rash Neuro: acute motor dysfunction Psych: worsening mood Endocrine: polydipsia Heme: bleeding Allergy: hayfever  GEN: nad, alert and oriented HEENT: mucous membranes moist NECK: supple w/o LA CV: rrr. PULM: ctab, no inc wob ABD: soft, +bs EXT: no edema SKIN: no acute rash but chronic changes noted.   Diabetic foot exam: Normal inspection except for chronic skin changes noted.  No skin breakdown No calluses  Normal DP pulses Normal sensation to light touch and monofilament Nails normal except for R 1st nail absent.

## 2017-11-07 ENCOUNTER — Encounter: Payer: Self-pay | Admitting: Family Medicine

## 2017-11-07 DIAGNOSIS — S52502E Unspecified fracture of the lower end of left radius, subsequent encounter for open fracture type I or II with routine healing: Secondary | ICD-10-CM | POA: Diagnosis not present

## 2017-11-07 DIAGNOSIS — M25532 Pain in left wrist: Secondary | ICD-10-CM | POA: Diagnosis not present

## 2017-11-07 DIAGNOSIS — M25632 Stiffness of left wrist, not elsewhere classified: Secondary | ICD-10-CM | POA: Diagnosis not present

## 2017-11-07 DIAGNOSIS — R531 Weakness: Secondary | ICD-10-CM | POA: Diagnosis not present

## 2017-11-07 NOTE — Assessment & Plan Note (Signed)
  [facility] this fall  Shingles 2008  PNA 2016  Tetanus 2008, d/w pt.   Colonoscopy 2014  Breast cancer screening up to date.  DXA 2015  Advance directive- husband designated if patient were incapacitated.  Cognitive function addressed- see scanned forms- and if abnormal then additional documentation follows.

## 2017-11-07 NOTE — Assessment & Plan Note (Signed)
SOB clearly improved, advised to restart statin and to go through with OSA testing.  No change in meds at this point.

## 2017-11-07 NOTE — Assessment & Plan Note (Addendum)
Recheck in about 3 months.  D/w pt.  Not due for repeat A1c today.  See above re: statin start.  Foot exam done, foot care d/w pt. Continue work on diet and exercise.

## 2017-11-07 NOTE — Assessment & Plan Note (Signed)
Advance directive- husband designated if patient were incapacitated.  

## 2017-11-09 ENCOUNTER — Other Ambulatory Visit: Payer: Self-pay | Admitting: *Deleted

## 2017-11-09 DIAGNOSIS — I5042 Chronic combined systolic (congestive) and diastolic (congestive) heart failure: Secondary | ICD-10-CM

## 2017-11-09 DIAGNOSIS — I420 Dilated cardiomyopathy: Secondary | ICD-10-CM

## 2017-11-14 ENCOUNTER — Telehealth: Payer: Self-pay | Admitting: Cardiology

## 2017-11-14 NOTE — Telephone Encounter (Signed)
Patient requests a call back from [place].

## 2017-11-15 NOTE — Telephone Encounter (Signed)
RN CALLED PATIENT INFORMED SPOKE TO HEART FAILURE CLINIC SCHEDULER- SCHEDULER STATES SHE WILL HAVE SOMEONE REVIEW AND CONTACT PATIENT. PATIENT IS AWARE - INFORMEDD PATIENT TO CONTACTT IF NO RESPONSE IN 14 DAYS. SHE VERBALIZED UNDERSTANDING.

## 2017-11-15 NOTE — Telephone Encounter (Signed)
Spoke to patient . She states she has not had a call in regards to referral heart failure clinic  she states she will come to office for lab work this Friday -- cmp, lipid  RN informed patient will contact  Clinic and  Get back to her about the progress of appointment. She verbalized understanding.

## 2017-11-15 NOTE — Telephone Encounter (Signed)
Follow up    Patient would like the nurse to call her back today.

## 2017-11-16 ENCOUNTER — Encounter (HOSPITAL_BASED_OUTPATIENT_CLINIC_OR_DEPARTMENT_OTHER): Payer: Medicare Other

## 2017-11-17 ENCOUNTER — Other Ambulatory Visit: Payer: Self-pay

## 2017-11-17 DIAGNOSIS — I5042 Chronic combined systolic (congestive) and diastolic (congestive) heart failure: Secondary | ICD-10-CM | POA: Diagnosis not present

## 2017-11-17 DIAGNOSIS — I420 Dilated cardiomyopathy: Secondary | ICD-10-CM | POA: Diagnosis not present

## 2017-11-17 DIAGNOSIS — I493 Ventricular premature depolarization: Secondary | ICD-10-CM | POA: Diagnosis not present

## 2017-11-17 DIAGNOSIS — E7849 Other hyperlipidemia: Secondary | ICD-10-CM | POA: Diagnosis not present

## 2017-11-17 LAB — COMPREHENSIVE METABOLIC PANEL
A/G RATIO: 1.7 (ref 1.2–2.2)
ALK PHOS: 62 IU/L (ref 39–117)
ALT: 13 IU/L (ref 0–32)
AST: 17 IU/L (ref 0–40)
Albumin: 4.2 g/dL (ref 3.5–4.8)
BILIRUBIN TOTAL: 0.5 mg/dL (ref 0.0–1.2)
BUN/Creatinine Ratio: 18 (ref 12–28)
BUN: 19 mg/dL (ref 8–27)
CHLORIDE: 104 mmol/L (ref 96–106)
CO2: 26 mmol/L (ref 20–29)
Calcium: 10.1 mg/dL (ref 8.7–10.3)
Creatinine, Ser: 1.07 mg/dL — ABNORMAL HIGH (ref 0.57–1.00)
GFR calc non Af Amer: 50 mL/min/{1.73_m2} — ABNORMAL LOW (ref >59)
GFR, EST AFRICAN AMERICAN: 58 mL/min/{1.73_m2} — AB (ref >59)
GLUCOSE: 90 mg/dL (ref 65–99)
Globulin, Total: 2.5 g/dL (ref 1.5–4.5)
POTASSIUM: 5.5 mmol/L — AB (ref 3.5–5.2)
Sodium: 143 mmol/L (ref 134–144)
TOTAL PROTEIN: 6.7 g/dL (ref 6.0–8.5)

## 2017-11-17 LAB — LIPID PANEL
CHOL/HDL RATIO: 4.8 ratio — AB (ref 0.0–4.4)
Cholesterol, Total: 179 mg/dL (ref 100–199)
HDL: 37 mg/dL — AB (ref >39)
LDL CALC: 117 mg/dL — AB (ref 0–99)
TRIGLYCERIDES: 125 mg/dL (ref 0–149)
VLDL CHOLESTEROL CAL: 25 mg/dL (ref 5–40)

## 2017-11-17 NOTE — Patient Outreach (Signed)
[facility]) Care Management   11/17/2017  Natasha Woodard [birth year] [id number]  Natasha Woodard is an 76 y.o. female  Subjective: Weight range of 162-164 pounds.  Weights daily and records in cell phone. Monitors CBG 3 times a week with a reported range of 92-101.  No new problems or concerns. No chest pain or shortness of breath.  Trying to follow low salt diet.   Patient reports that she is eating and sleeping well.  Reports she does water aerobics 4 times per week.   Objective:  Awake and alert. Very pleasant. Vitals:   11/17/17 1002  BP: 140/86  Pulse: 70  Resp: 16  SpO2: 97%  Weight: 162 lb 3.2 oz (73.6 kg)   Review of Systems  Constitutional: Negative.   HENT: Negative.   Eyes:       Wearing glasses  Respiratory: Negative.   Cardiovascular: Negative.   Gastrointestinal: Negative.   Genitourinary: Negative.   Musculoskeletal: Positive for joint pain.  Skin: Negative.   Neurological: Negative.   Endo/Heme/Allergies: Negative.   Psychiatric/Behavioral: Negative.     Physical Exam  Constitutional: She is oriented to person, place, and time. She appears well-developed and well-nourished.  Eyes:  Wearing glasses  Cardiovascular: Normal rate, normal heart sounds and intact distal pulses.  Respiratory: Effort normal and breath sounds normal.  GI: Soft. Bowel sounds are normal.  Musculoskeletal: Normal range of motion. She exhibits no edema.  Ambulating without problems  Neurological: She is alert and oriented to person, place, and time. She displays normal reflexes.  Skin: Skin is warm and dry.  Psychiatric: She has a normal mood and affect. Her behavior is normal. Judgment and thought content normal.    Encounter Medications:   Outpatient Encounter Medications as of 11/17/2017  Medication Sig  . acetaminophen (TYLENOL) 325 MG tablet Take 650 mg by mouth every 6 (six) hours as needed.  Marland Kitchen aspirin 81 MG tablet Take 1 tablet (81 mg total) by mouth  daily.  . carvedilol (COREG) 6.25 MG tablet Take 3 tablets (18.75 mg total) by mouth 2 (two) times daily.  . Cholecalciferol (VITAMIN D) 1000 UNITS capsule Take 1,000 Units by mouth daily.   . enalapril (VASOTEC) 20 MG tablet TAKE ONE TABLET BY MOUTH ONCE DAILY  . furosemide (LASIX) 20 MG tablet Take 20 mg tablet by mouth daily, may take an additional  20 mg per increase daily weight instruction as needed  . glucose blood (ACCU-CHEK AVIVA PLUS) test strip USE TO TEST BLOOD SUGAR ONCE DAILY FOR DM E11.9  . Lancets (ACCU-CHEK MULTICLIX) lancets USE ONE LANCET TO TEST BLOOD GLUCOSE DAILY FOR DM 250.00  . metFORMIN (GLUCOPHAGE) 500 MG tablet Take 0.5 tablets (250 mg total) by mouth 2 (two) times daily with a meal.  . spironolactone (ALDACTONE) 25 MG tablet Take 0.5 tablets (12.5 mg total) by mouth daily.  . traMADol (ULTRAM) 50 MG tablet TAKE 1 TABLET BY MOUTH EVERY 12 HOURS AS NEEDED  . lovastatin (MEVACOR) 40 MG tablet Off med as of 09/13/17 (Patient not taking: Reported on 10/16/2017)   No facility-administered encounter medications on file as of 11/17/2017.     Functional Status:   In your present state of health, do you have any difficulty performing the following activities: 10/16/2017 07/20/2017  Hearing? Tempie Donning  Vision? Y N  Difficulty concentrating or making decisions? N N  Walking or climbing stairs? N N  Dressing or bathing? N Y  Doing errands, shopping? N N  Preparing Food and eating ? N -  Using the Toilet? N -  In the past six months, have you accidently leaked urine? N -  Do you have problems with loss of bowel control? N -  Managing your Medications? N -  Comment iuses a pill box -  Managing your Finances? N -  Housekeeping or managing your Housekeeping? N -  Some recent data might be hidden    Fall/Depression Screening:    Fall Risk  11/06/2017 10/16/2017 09/26/2017  Falls in the past year? Yes Yes Yes  Number falls in past yr: 1 1 1   Injury with Fall? Yes Yes Yes  Risk  Factor Category  - High Fall Risk -  Risk for fall due to : - History of fall(s) -  Follow up - Falls prevention discussed Falls evaluation completed   PHQ 2/9 Scores 11/06/2017 10/16/2017 09/26/2017 09/15/2016 08/19/2014 07/30/2013  PHQ - 2 Score 0 0 0 0 0 0    Assessment:   (1) patient has made much progress and has made her health a priority.  She is exercising and eating a good diet low in sodium. (2) taking all medications as prescribed. (3) had labs drawn today.   Plan:  (1) encouraged patient to continue with daily weights and low salt diet. Reviewed importance of continuing her exercise. Reviewed heart failure zones and when to call MD. (2) encouraged patient to continue to take all medications as prescribed. (3) labs pending. Patient will call me with results.  Next home visit planned for 12/22/2017  Likely discharge home visit.   [facility] CM Care Plan Problem One     Most Recent Value  Care Plan Problem One  Knowledge deficit related to new diagnosis of heart failure. as evidence by patients request for more knowledge.  Role Documenting the Problem One  Care Management Coordinator  Care Plan for Problem One  Active  [facility] Long Term Goal   Patient will verbalize increase knowledge of self management of heart failure in the next 60 days.  [facility] Long Term Goal Start Date  10/16/17  Interventions for Problem One Long Term Goal  Home visit completed.  Reviewed importance of maintaining current exercise plan.  [facility] CM Short Term Goal #1   Patient will report exercising 5 days a week for the next 30 days.   [facility] CM Short Term Goal #1 Start Date  10/16/17  [facility] CM Short Term Goal #1 Met Date  11/17/17  Interventions for Short Term Goal #1  Reviewed the importance of daily exercise. Encouraged patient to exercise for 5 days per week.  [facility] CM Short Term Goal #2   Patient will report reading heart failure educational material before in the next home i visit in 30 days.   [facility] CM Short Term Goal #2 Start  Date  10/16/17  [facility] CM Short Term Goal #2 Met Date  11/17/17  Interventions for Short Term Goal #2  Provided Heart failure packet. Provided written heart failure zones.  Encouraged patient to read. will reassess knowledge at next home visit.      Tomasa Rand, RN, BSN, CEN [facility] [employer] [phone]

## 2017-11-30 ENCOUNTER — Ambulatory Visit: Payer: Medicare Other | Admitting: Cardiology

## 2017-11-30 ENCOUNTER — Encounter: Payer: Self-pay | Admitting: Cardiology

## 2017-11-30 DIAGNOSIS — I10 Essential (primary) hypertension: Secondary | ICD-10-CM

## 2017-11-30 DIAGNOSIS — E782 Mixed hyperlipidemia: Secondary | ICD-10-CM

## 2017-11-30 DIAGNOSIS — I420 Dilated cardiomyopathy: Secondary | ICD-10-CM

## 2017-11-30 DIAGNOSIS — I493 Ventricular premature depolarization: Secondary | ICD-10-CM

## 2017-11-30 MED ORDER — LOVASTATIN 40 MG PO TABS: 40.0000 mg | ORAL_TABLET | Freq: Every day | ORAL | 3 refills | Status: DC

## 2017-11-30 MED ORDER — SPIRONOLACTONE 25 MG PO TABS: 12.5000 mg | ORAL_TABLET | Freq: Every day | ORAL | 3 refills | Status: DC

## 2017-11-30 NOTE — Progress Notes (Signed)
PCP: [name], MD  Clinic Note: Chief Complaint  Patient presents with  . Follow-up    Nonischemic cardiomyopathy    HPI: Natasha Woodard is a 76 y.o. female with a PMH below who presents today for one month follow-up for systolic heart failure.  She is a very time intensive patient -- very inquisitive, but as a result somewhat slow in understanding.  Natasha Woodard was initially evaluated by Mr. Azalee Course, [place] earlier this summer and was noted to have frequent PVCs on EKG with a low resting heart rate. This was evaluated with an echocardiogram that showed an EF of 20-30% and a Holter monitor which revealed significant amount of ventricular ectopy. She was also referred for right and left heart catheterization back in July that showed minimal CAD with EF 25-30%, LVEDP 26 mmHg with PCWP of only 16 mHg). She has been treated medically - very difficult to get her started on medications - or to change b/c of how long it takes to explain things to her.  We have yet to get her converted from ACE-I to [facility], plan was to have her f/u with the CHF Clinic for education & medication titration (mostly b/c of how much effort is takes to explain her medications. - She has gradually noted significant improvement in symptoms.  Natasha Woodard was last seen on Sept 28, 2018 - she noted a dramatic improvement in her respiratory status and dyspnea symptoms.  She says her breathing was excellent.  She was able to walk a mile without stopping.  Only limited by arthritis pains.  Her weight is stabilized, and she was using minimal x-ray of Lasix.  Recent Hospitalizations: For cardiac catheterization in July August 10 - she had a fall with left wrist fracture, she also suffered some excoriation on the left arm.  Studies Personally Reviewed - (if available, images/films reviewed: From Epic Chart or Care Everywhere)  2 D Echo 10/26/2017: EF remains 30-35%.  Diffuse hypokinesis noted.  Severe LA  dilation.   Interval History: Natasha Woodard returns today to discuss results of her echocardiogram.  She says she feels great.  She is now starting to water aerobics, because doing the walking has been very difficult because of arthritis pains.  Water aerobics is quite "vigorous "where she is really having to get her heart rate up.  Is quite intensive.  She says she feels good she is not having difficulty breathing any more than she would expect to from being out of shape.  She says that she is also try to adjust her diet as we have discussed.  She said that in the last 4-5 weeks, she has not had any use an additional dose of Lasix, the one time she did use it was probably related to what she ate the night before.  She has not had any symptoms of PND, orthopnea and well-controlled edema.  Her exertional dyspnea is really only noted when she is rushing or carrying something upstairs or up an incline, but even that is improved. She has not had any symptoms at all chest tightness or pressure.    Remainder of cardiac review of symptoms: No palpitations, lightheadedness, dizziness, weakness or syncope/near syncope. No TIA/amaurosis fugax symptoms. No claudication.  ROS: A comprehensive was performed. Review of Systems  Constitutional: Negative for malaise/fatigue (Energy is getting better).  HENT: Negative for congestion and nosebleeds.   Respiratory: Negative for cough, sputum production, shortness of breath and wheezing.   Cardiovascular: Negative for leg  swelling (Minimal ankle edema).  Gastrointestinal: Negative for blood in stool and melena.  Genitourinary: Negative for hematuria.  Musculoskeletal: Negative for joint pain and myalgias.       Left arm seems to be healing from her fall and injury.  Skin:       Chronic thick scaly skin   Psychiatric/Behavioral: Negative for depression and memory loss (I does seem to have a true "memory loss ", but has a hard time remembering things mentioned to her.).  The patient is nervous/anxious.   All other systems reviewed and are negative.   I have reviewed and (if needed) personally updated the patient's problem list, medications, allergies, past medical and surgical history, social and family history.   Past Medical History:  Diagnosis Date  . Calcific tendonitis 2008   Treatment of left leg  . Cardiomyopathy (HCC) 06/2017   Echo: EF 25 and 30%. GR 1 DD. Mild diastolic dysfunction with elevated LVEDP. Mild valvular disease.  . Chronic combined systolic and diastolic CHF, NYHA class 2 and ACA/AHA stage C (HCC)   . Coronary artery disease, non-occlusive    mild-moderate CAD 06/30/17 cath  . CTS (carpal tunnel syndrome)   . Diabetes mellitus    Type II  . Dysrhythmia    patient said that she cant remember what it is  . Hiatal hernia    with reflux  . Hyperlipidemia    Statin intolerant  . Hypertension   . Ichthyosis congenita   . NSVD (normal spontaneous vaginal delivery) 1971 & 1972  . Obesity   . Plantar fasciitis    Left  . Pulmonary nodule    Imaged multiple times and benign appearing  . Wears glasses     Past Surgical History:  Procedure Laterality Date  . ABDOMINAL HYSTERECTOMY  1975   TAH-BSO  . ABSCESS DRAINAGE  1972   right breast  . BREAST BIOPSY Left 01/14/2009   Stereo- Benign  . CARPAL TUNNEL RELEASE Right 07/02/2014   Procedure: RIGHT CARPAL TUNNEL RELEASE;  Surgeon: Nicki Reaper, MD;  Location: [facility];  Service: Orthopedics;  Laterality: Right;  . CARPAL TUNNEL RELEASE Left 06/11/2015   Procedure: LEFT CARPAL TUNNEL RELEASE;  Surgeon: Cindee Salt, MD;  Location: [facility];  Service: Orthopedics;  Laterality: Left;  REGIONAL/FAB  . COLONOSCOPY    . corn removal  1968   right  . Holter Monitor  06/2017   ~17,000 PVC beats - majority were singlets, some couplets. 44 brief 3-7 beat runs of NSVT. Also noted were less frequent PACs with 11 runs (longest 15 beats)  . OPEN REDUCTION  INTERNAL FIXATION (ORIF) DISTAL RADIAL FRACTURE Left 07/21/2017   Procedure: OPEN REDUCTION INTERNAL FIXATION (ORIF) LEFT DISTAL RADIAL FRACTURE;  Surgeon: Sheral Apley, MD;  Location: [facility];  Service: Orthopedics;  Laterality: Left;  . RIGHT/LEFT HEART CATH AND CORONARY ANGIOGRAPHY N/A 06/30/2017   Procedure: Right/Left Heart Cath and Coronary Angiography;  Surgeon: Marykay Lex, MD;  Location: [facility] INVASIVE CV LAB:  pRCA 55%, pCx 40%, OM1 45%, mCx 50%, D2 50% - LVEF 25-35%. Moderately elevated LVEDP(26 mmHg with PCWP 16 mmHg).  FICK CO/CI: 4.47/2.48. PA pressures 47/14 mmHg with a mean of 27 mm.  Marland Kitchen ROTATOR CUFF REPAIR     left shoulder  . TRANSTHORACIC ECHOCARDIOGRAM  06/2017   EF 25 and 30%. GR 1 DD. Mild diastolic dysfunction with elevated LVEDP. Mild valvular disease.  . TRANSTHORACIC ECHOCARDIOGRAM  10/2017    EF  remains 30-35%.  Diffuse hypokinesis noted.  Severe LA dilation   Diagnostic Cardiac Cath Diagram 06/30/2017    Current Meds  Medication Sig  . acetaminophen (TYLENOL) 325 MG tablet Take 650 mg by mouth every 6 (six) hours as needed.  Marland Kitchen aspirin 81 MG tablet Take 1 tablet (81 mg total) by mouth daily.  . carvedilol (COREG) 6.25 MG tablet Take 3 tablets (18.75 mg total) by mouth 2 (two) times daily.  . Cholecalciferol (VITAMIN D) 1000 UNITS capsule Take 1,000 Units by mouth daily.   . enalapril (VASOTEC) 20 MG tablet TAKE ONE TABLET BY MOUTH ONCE DAILY  . furosemide (LASIX) 20 MG tablet Take 20 mg tablet by mouth daily, may take an additional  20 mg per increase daily weight instruction as needed  . glucose blood (ACCU-CHEK AVIVA PLUS) test strip USE TO TEST BLOOD SUGAR ONCE DAILY FOR DM E11.9  . Lancets (ACCU-CHEK MULTICLIX) lancets USE ONE LANCET TO TEST BLOOD GLUCOSE DAILY FOR DM 250.00  . lovastatin (MEVACOR) 40 MG tablet Take 1 tablet (40 mg total) by mouth at bedtime.  . metFORMIN (GLUCOPHAGE) 500 MG tablet Take 0.5 tablets (250 mg total) by mouth 2 (two) times daily  with a meal.  . spironolactone (ALDACTONE) 25 MG tablet Take 0.5 tablets (12.5 mg total) by mouth daily.  . traMADol (ULTRAM) 50 MG tablet TAKE 1 TABLET BY MOUTH EVERY 12 HOURS AS NEEDED  . [DISCONTINUED] lovastatin (MEVACOR) 40 MG tablet Off med as of 09/13/17  . [DISCONTINUED] spironolactone (ALDACTONE) 25 MG tablet Take 0.5 tablets (12.5 mg total) by mouth daily.    Allergies  Allergen Reactions  . Ezetimibe-Simvastatin Other (See Comments)    REACTION: Muscle aches (side effect)  . Lipitor [Atorvastatin] Other (See Comments)    Leg weakness   . Pravastatin Sodium Other (See Comments)    REACTION: Muscle aches (side effect)  . Sulfonamide Derivatives Nausea And Vomiting    Social History   Socioeconomic History  . Marital status: Married    Spouse name: None  . Number of children: 2  . Years of education: None  . Highest education level: None  Social Needs  . Financial resource strain: None  . Food insecurity - worry: None  . Food insecurity - inability: None  . Transportation needs - medical: None  . Transportation needs - non-medical: None  Occupational History    Employer: RETIRED  Tobacco Use  . Smoking status: Former Smoker    Years: 28.00    Types: Cigarettes    Last attempt to quit: 12/12/1993    Years since quitting: 23.9  . Smokeless tobacco: Never Used  Substance and Sexual Activity  . Alcohol use: No    Alcohol/week: 0.0 oz  . Drug use: No  . Sexual activity: No  Other Topics Concern  . None  Social History Narrative   Two kids, two grandchildren.    Married 1969   Retired.     family history includes Cancer in her brother; Diabetes in her father and mother; Heart disease in her brother, father, and mother.  Wt Readings from Last 3 Encounters:  11/30/17 167 lb 12.8 oz (76.1 kg)  11/17/17 162 lb 3.2 oz (73.6 kg)  11/06/17 166 lb (75.3 kg)    PHYSICAL EXAM BP 120/68   Pulse 76   Ht 5\' 2"  (1.575 m)   Wt 167 lb 12.8 oz (76.1 kg)   BMI 30.69  kg/m  Physical Exam  Constitutional: She is oriented to person,  place, and time. She appears well-developed and well-nourished. No distress.  HENT:  Head: Normocephalic and atraumatic.  Mouth/Throat: No oropharyngeal exudate.  Eyes: EOM are normal.  Neck: Normal range of motion. Neck supple. No hepatojugular reflux and no JVD present. Carotid bruit is not present.  Cardiovascular: Normal rate, regular rhythm, normal heart sounds and normal pulses. PMI is not displaced. Exam reveals no gallop (Cannot exclude soft S4 gallop).  No murmur heard. Pulmonary/Chest: Effort normal and breath sounds normal. No respiratory distress. She has no wheezes.  Abdominal: Soft. Bowel sounds are normal. She exhibits no distension. There is no tenderness.  Musculoskeletal: Normal range of motion. She exhibits no edema.  Slow, steady gait  Neurological: She is alert and oriented to person, place, and time.  Skin: Skin is warm and dry.  She has scaly skin in both the upper and lower extremities. No obvious bruising, but she does have skin tears  Psychiatric: She has a normal mood and affect. Her behavior is normal. Judgment and thought content normal.  He is a poor grasp on new information  Nursing note and vitals reviewed.    Adult ECG Report n/a  Other studies Reviewed: Additional studies/ records that were reviewed today include:  Recent Labs:    Lab Results  Component Value Date   CREATININE 1.07 (H) 11/17/2017   BUN 19 11/17/2017   NA 143 11/17/2017   K 5.5 (H) 11/17/2017   CL 104 11/17/2017   CO2 26 11/17/2017   Lab Results  Component Value Date   CHOL 179 11/17/2017   HDL 37 (L) 11/17/2017   LDLCALC 117 (H) 11/17/2017   LDLDIRECT 53 08/10/2009   TRIG 125 11/17/2017   CHOLHDL 4.8 (H) 11/17/2017  - has not taken lovastatin since Sept 3.   ASSESSMENT / PLAN: Problem List Items Addressed This Visit    Dilated cardiomyopathy (HCC) (Chronic)    Nonischemic cardiomyopathy.  Still not  exactly sure what the true etiology was. She is on stable regimen, and doing so well that I am really reluctant to make any further adjustments at this time.  She is on stable dose of carvedilol and lisinopril and only using minimal additional Lasix. She is already on low-dose spironolactone and doing well.   My plan had been to consider switching her from ACE inhibitor to [facility], but I am leery of doing such simply because she is doing so well, feeling so much better.  I did not want to jeopardize her level of comfort in her current regimen at this time. I did explain to her that I do want her to follow-up with a heart failure clinic in order to potentially switch her from a medication that she is on now 21 so that she understands that this may occur. However I am not sure that she will become any less symptomatic than she is at this point.      Relevant Medications   lovastatin (MEVACOR) 40 MG tablet   spironolactone (ALDACTONE) 25 MG tablet   Essential hypertension (Chronic)    Well-controlled.  Stable.  She is not having any dizziness to suggest orthostatic symptoms.  At this point I will leave medications alone, but in the future will plan to switch from an ACE inhibitor to [facility]      Relevant Medications   lovastatin (MEVACOR) 40 MG tablet   spironolactone (ALDACTONE) 25 MG tablet   Frequent PVCs (Chronic)    She does not noticed these palpitations.  I hear them  on exam, but she did not notice them at all. Not sure if the PVCs are the cause of her cardia myopathy, or as a result of.  Regardless, she is on stable dose of beta-blocker with no sense of arrhythmia.      Relevant Medications   lovastatin (MEVACOR) 40 MG tablet   spironolactone (ALDACTONE) 25 MG tablet   HLD (hyperlipidemia) (Chronic)    She is not currently taking a statin.  She has been followed by her PCP.  Her LDL is 117 by the most recent check.    She does have some nonobstructive CAD, so therefore Target LDL  should probably be below 100.      Relevant Medications   lovastatin (MEVACOR) 40 MG tablet   spironolactone (ALDACTONE) 25 MG tablet     Lyla SonCarrie actually was much easier to meet with today.  She has been doing very well, so therefore has not made any as such, she was quite happy.  She was also happy to hear that the increase of her ejection fraction.  Current medicines are reviewed at length with the patient today. (+/- concerns) none The following changes have been made: -    Patient Instructions  NO CHANGES WITH CURRENT MEDICATION   KEEP APPOINTMENT CHF CLINIC IN JAN 2019  Your physician wants you to follow-up in MAY /June 2019 WITH DR Cashay Manganelli.You will receive a reminder letter in the mail two months in advance. If you don't receive a letter, please call our office to schedule the follow-up appointment.   If you need a refill on your cardiac medications before your next appointment, please call your pharmacy.    Studies Ordered:   No orders of the defined types were placed in this encounter.     Bryan Lemmaavid Iliani Vejar, M.D., M.S. Interventional Cardiologist   Pager # [phone] Phone # [phone] [address] [place], [place] [id number]

## 2017-11-30 NOTE — Patient Instructions (Addendum)
NO CHANGES WITH CURRENT MEDICATION   KEEP APPOINTMENT CHF CLINIC IN JAN 2019  Your physician wants you to follow-up in MAY /June 2019 WITH DR HARDING.You will receive a reminder letter in the mail two months in advance. If you don't receive a letter, please call our office to schedule the follow-up appointment.   If you need a refill on your cardiac medications before your next appointment, please call your pharmacy.

## 2017-12-03 ENCOUNTER — Encounter: Payer: Self-pay | Admitting: Cardiology

## 2017-12-03 NOTE — Assessment & Plan Note (Signed)
Well-controlled.  Stable.  She is not having any dizziness to suggest orthostatic symptoms.  At this point I will leave medications alone, but in the future will plan to switch from an ACE inhibitor to [facility]

## 2017-12-03 NOTE — Assessment & Plan Note (Signed)
She is not currently taking a statin.  She has been followed by her PCP.  Her LDL is 117 by the most recent check.    She does have some nonobstructive CAD, so therefore Target LDL should probably be below 100.

## 2017-12-03 NOTE — Assessment & Plan Note (Signed)
Nonischemic cardiomyopathy.  Still not exactly sure what the true etiology was. She is on stable regimen, and doing so well that I am really reluctant to make any further adjustments at this time.  She is on stable dose of carvedilol and lisinopril and only using minimal additional Lasix. She is already on low-dose spironolactone and doing well.   My plan had been to consider switching her from ACE inhibitor to [facility], but I am leery of doing such simply because she is doing so well, feeling so much better.  I did not want to jeopardize her level of comfort in her current regimen at this time. I did explain to her that I do want her to follow-up with a heart failure clinic in order to potentially switch her from a medication that she is on now 21 so that she understands that this may occur. However I am not sure that she will become any less symptomatic than she is at this point.

## 2017-12-03 NOTE — Assessment & Plan Note (Signed)
She does not noticed these palpitations.  I hear them on exam, but she did not notice them at all. Not sure if the PVCs are the cause of her cardia myopathy, or as a result of.  Regardless, she is on stable dose of beta-blocker with no sense of arrhythmia.

## 2017-12-16 ENCOUNTER — Other Ambulatory Visit: Payer: Self-pay | Admitting: Family Medicine

## 2017-12-22 ENCOUNTER — Other Ambulatory Visit: Payer: Self-pay

## 2017-12-22 NOTE — Patient Outreach (Signed)
[facility]) Care Management   12/22/2017  Natasha Woodard 30-May-1941 [id number]  Natasha Woodard is an 77 y.o. female  Subjective: Reports that she is doing well.  Reports she is exercising 4 days per week.  Reports she is following her diet and reading labels. Reports weighing daily. Patient reports she is able to monitor her pulse now when she exercises.  Reports her cholesterol remains high and she has started on medications now and stopped the eggplant water.   Objective:  Awake and alert. Ambulating without difficulty.  Vitals:   12/22/17 1015  BP: 122/78  Pulse: 76  Resp: 20  SpO2: 97%  Weight: 163 lb 9.6 oz (74.2 kg)   Review of Systems  Constitutional: Negative.   HENT: Negative.   Eyes: Negative.   Respiratory: Negative.   Cardiovascular: Negative.   Gastrointestinal: Negative.   Genitourinary: Negative.   Musculoskeletal: Positive for joint pain.       Left wrist  Skin: Negative.   Neurological: Negative.   Endo/Heme/Allergies: Negative.   Psychiatric/Behavioral: Negative.     Physical Exam  Constitutional: She appears well-developed and well-nourished.  Cardiovascular: Normal rate, normal heart sounds and intact distal pulses.  Respiratory: Effort normal and breath sounds normal.  GI: Soft. Bowel sounds are normal.    Encounter Medications:   Outpatient Encounter Medications as of 12/22/2017  Medication Sig  . acetaminophen (TYLENOL) 325 MG tablet Take 650 mg by mouth every 6 (six) hours as needed.  Marland Kitchen aspirin 81 MG tablet Take 1 tablet (81 mg total) by mouth daily.  . carvedilol (COREG) 6.25 MG tablet Take 3 tablets (18.75 mg total) by mouth 2 (two) times daily.  . enalapril (VASOTEC) 20 MG tablet TAKE ONE TABLET BY MOUTH ONCE DAILY  . furosemide (LASIX) 20 MG tablet Take 20 mg tablet by mouth daily, may take an additional  20 mg per increase daily weight instruction as needed  . glucose blood (ACCU-CHEK AVIVA PLUS) test strip USE TO  TEST BLOOD SUGAR ONCE DAILY FOR DM E11.9  . Lancets (ACCU-CHEK MULTICLIX) lancets USE ONE LANCET TO TEST BLOOD GLUCOSE DAILY FOR DM 250.00  . lovastatin (MEVACOR) 40 MG tablet Take 1 tablet (40 mg total) by mouth at bedtime.  . metFORMIN (GLUCOPHAGE) 500 MG tablet TAKE ONE-HALF TABLET BY MOUTH TWICE DAILY WITH MEALS  . spironolactone (ALDACTONE) 25 MG tablet Take 0.5 tablets (12.5 mg total) by mouth daily.  . traMADol (ULTRAM) 50 MG tablet TAKE 1 TABLET BY MOUTH EVERY 12 HOURS AS NEEDED  . Cholecalciferol (VITAMIN D) 1000 UNITS capsule Take 1,000 Units by mouth daily.    No facility-administered encounter medications on file as of 12/22/2017.     Functional Status:   In your present state of health, do you have any difficulty performing the following activities: 10/16/2017 07/20/2017  Hearing? Tempie Donning  Vision? Y N  Difficulty concentrating or making decisions? N N  Walking or climbing stairs? N N  Dressing or bathing? N Y  Doing errands, shopping? N N  Preparing Food and eating ? N -  Using the Toilet? N -  In the past six months, have you accidently leaked urine? N -  Do you have problems with loss of bowel control? N -  Managing your Medications? N -  Comment iuses a pill box -  Managing your Finances? N -  Housekeeping or managing your Housekeeping? N -  Some recent data might be hidden    Fall/Depression Screening:  Fall Risk  11/06/2017 10/16/2017 09/26/2017  Falls in the past year? Yes Yes Yes  Number falls in past yr: 1 1 1   Injury with Fall? Yes Yes Yes  Risk Factor Category  - High Fall Risk -  Risk for fall due to : - History of fall(s) -  Follow up - Falls prevention discussed Falls evaluation completed   PHQ 2/9 Scores 11/06/2017 10/16/2017 09/26/2017 09/15/2016 08/19/2014 07/30/2013  PHQ - 2 Score 0 0 0 0 0 0    Assessment:   (1) Generally reports she is feeling well. (2) monitoring CBG 2 times per week with readings 100. (3) following low salt diet, weighing daily,  taking medications as prescribed. (4) restarted lovastatin.   Plan:  (1) encouraged daily exercise (2) encouraged patient to continue to monitor CBG as directed from MD. (3) Reviewed pending heart failure clinic appointment. Encouraged patient to continue to monitor daily weights.  (4) encouraged patient to continue to take medications as prescribed and reports any side effects to MD.  Reviewed care plan with patient who reports [facility] has been very helpful to her and she would prefer to stay active.  Reviewed health coach and patient would like one more month with this case Freight forwarder. Scheduled phone call for 01/26/2018 at 11am.  Patient to call sooner if needed.  [facility] CM Care Plan Problem One     Most Recent Value  Care Plan Problem One  Knowledge deficit related to new diagnosis of heart failure. as evidence by patients request for more knowledge.  Role Documenting the Problem One  Care Management Coordinator  Care Plan for Problem One  Active  [facility] Long Term Goal   Patient will verbalize increase knowledge of self management of heart failure in the next 60 days.  [facility] Long Term Goal Start Date  10/16/17  Interventions for Problem One Long Term Goal  Reviewed heart failure zones. low salt diet and when to call MD.   [facility] CM Short Term Goal #1   Patient will report exercising 5 days a week for the next 30 days.   [facility] CM Short Term Goal #1 Start Date  10/16/17  [facility] CM Short Term Goal #1 Met Date  11/17/17  Interventions for Short Term Goal #1  Reviewed the importance of daily exercise. Encouraged patient to exercise for 5 days per week.  [facility] CM Short Term Goal #2   Patient will report reading heart failure educational material before in the next home i visit in 30 days.   [facility] CM Short Term Goal #2 Start Date  10/16/17  [facility] CM Short Term Goal #2 Met Date  11/17/17  Interventions for Short Term Goal #2  Provided Heart failure packet. Provided written heart failure zones.  Encouraged patient to read.  will reassess knowledge at next home visit.      Tomasa Rand, RN, BSN, CEN [facility] [employer] [phone]

## 2018-01-01 ENCOUNTER — Other Ambulatory Visit: Payer: Self-pay

## 2018-01-01 ENCOUNTER — Ambulatory Visit (HOSPITAL_COMMUNITY)
Admission: RE | Admit: 2018-01-01 | Discharge: 2018-01-01 | Disposition: A | Discharge status: Home / Self Care | Payer: Medicare Other | Source: Ambulatory Visit | Attending: Internal Medicine | Admitting: Internal Medicine

## 2018-01-01 ENCOUNTER — Encounter (HOSPITAL_COMMUNITY): Payer: Self-pay | Admitting: Internal Medicine

## 2018-01-01 VITALS — BP 140/78 | HR 79 | Wt 170.0 lb

## 2018-01-01 DIAGNOSIS — I509 Heart failure, unspecified: Secondary | ICD-10-CM | POA: Diagnosis present

## 2018-01-01 DIAGNOSIS — I428 Other cardiomyopathies: Secondary | ICD-10-CM | POA: Insufficient documentation

## 2018-01-01 DIAGNOSIS — I493 Ventricular premature depolarization: Secondary | ICD-10-CM | POA: Diagnosis not present

## 2018-01-01 DIAGNOSIS — Z8249 Family history of ischemic heart disease and other diseases of the circulatory system: Secondary | ICD-10-CM | POA: Insufficient documentation

## 2018-01-01 DIAGNOSIS — I2510 Atherosclerotic heart disease of native coronary artery without angina pectoris: Secondary | ICD-10-CM | POA: Insufficient documentation

## 2018-01-01 DIAGNOSIS — Z882 Allergy status to sulfonamides status: Secondary | ICD-10-CM | POA: Diagnosis not present

## 2018-01-01 DIAGNOSIS — Z6831 Body mass index (BMI) 31.0-31.9, adult: Secondary | ICD-10-CM | POA: Diagnosis not present

## 2018-01-01 DIAGNOSIS — Z888 Allergy status to other drugs, medicaments and biological substances status: Secondary | ICD-10-CM | POA: Diagnosis not present

## 2018-01-01 DIAGNOSIS — Z7982 Long term (current) use of aspirin: Secondary | ICD-10-CM | POA: Insufficient documentation

## 2018-01-01 DIAGNOSIS — Z833 Family history of diabetes mellitus: Secondary | ICD-10-CM | POA: Diagnosis not present

## 2018-01-01 DIAGNOSIS — E785 Hyperlipidemia, unspecified: Secondary | ICD-10-CM | POA: Insufficient documentation

## 2018-01-01 DIAGNOSIS — K219 Gastro-esophageal reflux disease without esophagitis: Secondary | ICD-10-CM | POA: Insufficient documentation

## 2018-01-01 DIAGNOSIS — I110 Hypertensive heart disease with heart failure: Secondary | ICD-10-CM | POA: Insufficient documentation

## 2018-01-01 DIAGNOSIS — E119 Type 2 diabetes mellitus without complications: Secondary | ICD-10-CM | POA: Insufficient documentation

## 2018-01-01 DIAGNOSIS — E669 Obesity, unspecified: Secondary | ICD-10-CM | POA: Diagnosis not present

## 2018-01-01 DIAGNOSIS — Z79899 Other long term (current) drug therapy: Secondary | ICD-10-CM | POA: Insufficient documentation

## 2018-01-01 DIAGNOSIS — I5042 Chronic combined systolic (congestive) and diastolic (congestive) heart failure: Secondary | ICD-10-CM | POA: Diagnosis not present

## 2018-01-01 DIAGNOSIS — Z801 Family history of malignant neoplasm of trachea, bronchus and lung: Secondary | ICD-10-CM | POA: Diagnosis not present

## 2018-01-01 DIAGNOSIS — Z87891 Personal history of nicotine dependence: Secondary | ICD-10-CM | POA: Insufficient documentation

## 2018-01-01 DIAGNOSIS — Z7984 Long term (current) use of oral hypoglycemic drugs: Secondary | ICD-10-CM | POA: Diagnosis not present

## 2018-01-01 DIAGNOSIS — I5022 Chronic systolic (congestive) heart failure: Secondary | ICD-10-CM | POA: Insufficient documentation

## 2018-01-01 DIAGNOSIS — I10 Essential (primary) hypertension: Secondary | ICD-10-CM

## 2018-01-01 LAB — BASIC METABOLIC PANEL
Anion gap: 11 (ref 5–15)
BUN: 19 mg/dL (ref 6–20)
CHLORIDE: 103 mmol/L (ref 101–111)
CO2: 27 mmol/L (ref 22–32)
Calcium: 9.6 mg/dL (ref 8.9–10.3)
Creatinine, Ser: 1.13 mg/dL — ABNORMAL HIGH (ref 0.44–1.00)
GFR calc non Af Amer: 46 mL/min — ABNORMAL LOW (ref >60)
GFR, EST AFRICAN AMERICAN: 53 mL/min — AB (ref >60)
Glucose, Bld: 90 mg/dL (ref 65–99)
POTASSIUM: 4.8 mmol/L (ref 3.5–5.1)
SODIUM: 141 mmol/L (ref 135–145)

## 2018-01-01 LAB — BRAIN NATRIURETIC PEPTIDE: B NATRIURETIC PEPTIDE 5: 281.6 pg/mL — AB (ref 0.0–100.0)

## 2018-01-01 MED ORDER — SACUBITRIL-VALSARTAN 49-51 MG PO TABS: 1.0000 | ORAL_TABLET | Freq: Two times a day (BID) | ORAL | 3 refills | Status: DC

## 2018-01-01 NOTE — Patient Instructions (Signed)
Stop Enalapril  Start Entresto 49/51 mg Twice daily STARTING ON WED 1/23 AM  Labs today  Your physician has recommended that you wear a holter monitor. Holter monitors are medical devices that record the heart's electrical activity. Doctors most often use these monitors to diagnose arrhythmias. Arrhythmias are problems with the speed or rhythm of the heartbeat. The monitor is a small, portable device. You can wear one while you do your normal daily activities. This is usually used to diagnose what is causing palpitations/syncope (passing out).  Your physician has requested that you have a cardiac MRI. Cardiac MRI uses a computer to create images of your heart as its beating, producing both still and moving pictures of your heart and major blood vessels. For further information please visit [email or web address]. Please follow the instruction sheet given to you today for more information.  Your physician recommends that you schedule a follow-up appointment in: 2 months

## 2018-01-01 NOTE — Progress Notes (Signed)
ADVANCED HF CLINIC CONSULT NOTE  Referring Physician: Herbie Baltimore  Primary Care: Para March Primary Cardiologist: Herbie Baltimore  HPI:  Natasha Woodard is a 77 y.o. female with a h/o HL, HTN, DM2 and systolic HF due to NICM referred by Dr. Herbie Baltimore for further evaluation of her HF.   Verdella was initially evaluated in the summer 2018 and was noted to have frequent PVCs on EKG with a low resting heart rate. Holter showed 13% PVCs. Echo showed an EF of 20-30% and a Holter monitor which revealed significant amount of ventricular ectopy. She was also referred for right and left heart catheterization back in July that showed minimal CAD with EF 25-30%, LVEDP 26 mmHg with PCWP of only 16 mHg).  She has been treated medically. F/u echo 10/26/2017  2 D Echo 10/26/2017: EF remains 30-35%.  Diffuse hypokinesis noted.  Severe LA dilation. RV normal  Says she feels better. Going to water aerobics 4x/week for 1 hour. With easier classes feel good. On the harder day gets tired after the class and wiped out for the rest of the day. No problems with ADLs. Able to go to grocery store without problem. No edema, orthopnea or PND. No dizziness. No problems with medicine except for lovastatin. Occasionally takes extra lasix for swelling.    Review of Systems: [y] = yes, [ ]  = no   General: Weight gain [ ] ; Weight loss [ ] ; Anorexia [ ] ; Fatigue [ y]; Fever [ ] ; Chills [ ] ; Weakness [ ]   Cardiac: Chest pain/pressure [ ] ; Resting SOB [ ] ; Exertional SOB [ ] ; Orthopnea [ ] ; Pedal Edema [ ] ; Palpitations [ ] ; Syncope [ ] ; Presyncope [ ] ; Paroxysmal nocturnal dyspnea[ ]   Pulmonary: Cough [ ] ; Wheezing[ ] ; Hemoptysis[ ] ; Sputum [ ] ; Snoring [ ]   GI: Vomiting[ ] ; Dysphagia[ ] ; Melena[ ] ; Hematochezia [ ] ; Heartburn[ ] ; Abdominal pain [ ] ; Constipation [ ] ; Diarrhea [ ] ; BRBPR [ ]   GU: Hematuria[ ] ; Dysuria [ ] ; Nocturia[ ]   Vascular: Pain in legs with walking [ ] ; Pain in feet with lying flat [ ] ; Non-healing sores [ ] ; Stroke  [ ] ; TIA [ ] ; Slurred speech [ ] ;  Neuro: Headaches[ ] ; Vertigo[ ] ; Seizures[ ] ; Paresthesias[ ] ;Blurred vision [ ] ; Diplopia [ ] ; Vision changes [ ]   Ortho/Skin: Arthritis [ y]; Joint pain [place] ]; Muscle pain [ ] ; Joint swelling [ ] ; Back Pain [ ] ; Rash [ ]   Psych: Depression[ ] ; Anxiety[ ]   Heme: Bleeding problems [ ] ; Clotting disorders [ ] ; Anemia [ ]   Endocrine: Diabetes [ ] ; Thyroid dysfunction[ ]    Past Medical History:  Diagnosis Date  . Calcific tendonitis 2008   Treatment of left leg  . Cardiomyopathy (HCC) 06/2017   Echo: EF 25 and 30%. GR 1 DD. Mild diastolic dysfunction with elevated LVEDP. Mild valvular disease.  . Chronic combined systolic and diastolic CHF, NYHA class 2 and ACA/AHA stage C (HCC)   . Coronary artery disease, non-occlusive    mild-moderate CAD 06/30/17 cath  . CTS (carpal tunnel syndrome)   . Diabetes mellitus    Type II  . Dysrhythmia    patient said that she cant remember what it is  . Hiatal hernia    with reflux  . Hyperlipidemia    Statin intolerant  . Hypertension   . Ichthyosis congenita   . NSVD (normal spontaneous vaginal delivery) 1971 & 1972  . Obesity   . Plantar fasciitis    Left  .  Pulmonary nodule    Imaged multiple times and benign appearing  . Wears glasses     Current Outpatient Medications  Medication Sig Dispense Refill  . acetaminophen (TYLENOL) 325 MG tablet Take 650 mg by mouth every 6 (six) hours as needed.    Marland Kitchen aspirin 81 MG tablet Take 1 tablet (81 mg total) by mouth daily.    . carvedilol (COREG) 6.25 MG tablet Take 3 tablets (18.75 mg total) by mouth 2 (two) times daily. 540 tablet 1  . Cholecalciferol (VITAMIN D) 1000 UNITS capsule Take 1,000 Units by mouth daily.     . enalapril (VASOTEC) 20 MG tablet TAKE ONE TABLET BY MOUTH ONCE DAILY 90 tablet 1  . furosemide (LASIX) 20 MG tablet Take 20 mg tablet by mouth daily, may take an additional  20 mg per increase daily weight instruction as needed 135 tablet 3  .  glucose blood (ACCU-CHEK AVIVA PLUS) test strip USE TO TEST BLOOD SUGAR ONCE DAILY FOR DM E11.9 100 each 1  . Lancets (ACCU-CHEK MULTICLIX) lancets USE ONE LANCET TO TEST BLOOD GLUCOSE DAILY FOR DM 250.00 102 each 3  . lovastatin (MEVACOR) 40 MG tablet Take 1 tablet (40 mg total) by mouth at bedtime. 90 tablet 3  . metFORMIN (GLUCOPHAGE) 500 MG tablet TAKE ONE-HALF TABLET BY MOUTH TWICE DAILY WITH MEALS 90 tablet 3  . spironolactone (ALDACTONE) 25 MG tablet Take 0.5 tablets (12.5 mg total) by mouth daily. 45 tablet 3  . traMADol (ULTRAM) 50 MG tablet TAKE 1 TABLET BY MOUTH EVERY 12 HOURS AS NEEDED 180 tablet 1   No current facility-administered medications for this encounter.     Allergies  Allergen Reactions  . Ezetimibe-Simvastatin Other (See Comments)    REACTION: Muscle aches (side effect)  . Lipitor [Atorvastatin] Other (See Comments)    Leg weakness   . Pravastatin Sodium Other (See Comments)    REACTION: Muscle aches (side effect)  . Sulfonamide Derivatives Nausea And Vomiting      Social History   Socioeconomic History  . Marital status: Married    Spouse name: Not on file  . Number of children: 2  . Years of education: Not on file  . Highest education level: Not on file  Social Needs  . Financial resource strain: Not on file  . Food insecurity - worry: Not on file  . Food insecurity - inability: Not on file  . Transportation needs - medical: Not on file  . Transportation needs - non-medical: Not on file  Occupational History    Employer: RETIRED  Tobacco Use  . Smoking status: Former Smoker    Years: 28.00    Types: Cigarettes    Last attempt to quit: 12/12/1993    Years since quitting: 24.0  . Smokeless tobacco: Never Used  Substance and Sexual Activity  . Alcohol use: No    Alcohol/week: 0.0 oz  . Drug use: No  . Sexual activity: No  Other Topics Concern  . Not on file  Social History Narrative   Two kids, two grandchildren.    Married 1969   Retired.         Family History  Problem Relation Age of Onset  . Heart disease Mother        s/p pacemaker  . Diabetes Mother   . Heart disease Father   . Diabetes Father   . Heart disease Brother        MI  . Cancer Brother  Lung  . Colon cancer Neg Hx   . Breast cancer Neg Hx     Vitals:   01/01/18 1131  BP: 140/78  Pulse: 79  SpO2: 100%  Weight: 170 lb (77.1 kg)    PHYSICAL EXAM: General:  Well appearing. No respiratory difficulty HEENT: normal Neck: supple. no 8-9. Carotids 2+ bilat; no bruits. No lymphadenopathy or thryomegaly appreciated. Cor: PMI nondisplaced. Regular rate & rhythm with some ectopy No rubs, gallops or murmurs. Lungs: clear Abdomen: soft, nontender, nondistended. No hepatosplenomegaly. No bruits or masses. Good bowel sounds. Extremities: no cyanosis, clubbing, rash, trace edema Neuro: alert & oriented x 3, cranial nerves grossly intact. moves all 4 extremities w/o difficulty. Affect pleasant.  ASSESSMENT & PLAN:  1. Chronic systolic HF - due to NICM. Cath 7/18 with minimal CAD. Initial EF 20-25%. Etiology ? Frequent PVCs - last echo (Personally reviewed) EF 30-35%. 11/18 - NYHA II - On low-dose lasix but volume status OK with frequent use of sliding-scale lasix - stop enalapril. Start entresto 49/51 - continue carvedilol 18.75 bid - continue spiro 12.5 daily - cMRI to re-evaluate EF and look for infiltrative process - 48 holter to reassess PVC burden. If remains > 10% consider PVC suppression - Extensive discussion ~ 60 minutes about HF etiology, prognosis, therapy as well as reasons for meds. Also discussed possible need for ICD if EF remains <= 35%  2. Frequent PVCs - Initial monitor with 13% PVCs 7/18. ? Etiology of NICM - Seems to have less on exam today.  - Repeat 48 holter to reassess PVC burden. If remains > 10% consider PVC suppression  3. HTN - BP mildly elevated. Switching enalapril to Entresto  4. DM2 - consider  Jardiance  Total time spent > 60 minutes. Over half that time spent discussing above.   Arvilla Meres, MD  12:27 PM

## 2018-01-09 ENCOUNTER — Ambulatory Visit (INDEPENDENT_AMBULATORY_CARE_PROVIDER_SITE_OTHER): Payer: Medicare Other

## 2018-01-09 DIAGNOSIS — I493 Ventricular premature depolarization: Secondary | ICD-10-CM

## 2018-01-12 DIAGNOSIS — I493 Ventricular premature depolarization: Secondary | ICD-10-CM

## 2018-01-12 HISTORY — PX: OTHER SURGICAL HISTORY: SHX169

## 2018-01-12 HISTORY — DX: Ventricular premature depolarization: I49.3

## 2018-01-17 ENCOUNTER — Ambulatory Visit (HOSPITAL_COMMUNITY)
Admission: RE | Admit: 2018-01-17 | Discharge: 2018-01-17 | Disposition: A | Discharge status: Home / Self Care | Payer: Medicare Other | Source: Ambulatory Visit | Attending: Internal Medicine | Admitting: Internal Medicine

## 2018-01-17 DIAGNOSIS — I5042 Chronic combined systolic (congestive) and diastolic (congestive) heart failure: Secondary | ICD-10-CM | POA: Diagnosis not present

## 2018-01-17 DIAGNOSIS — I493 Ventricular premature depolarization: Secondary | ICD-10-CM | POA: Insufficient documentation

## 2018-01-17 DIAGNOSIS — I252 Old myocardial infarction: Secondary | ICD-10-CM | POA: Diagnosis not present

## 2018-01-17 DIAGNOSIS — I429 Cardiomyopathy, unspecified: Secondary | ICD-10-CM | POA: Diagnosis not present

## 2018-01-17 MED ORDER — GADOBENATE DIMEGLUMINE 529 MG/ML IV SOLN
25.0000 mL | Freq: Once | INTRAVENOUS | Status: AC | PRN
  Administered 2018-01-17: 25 mL via INTRAVENOUS

## 2018-01-26 ENCOUNTER — Ambulatory Visit (HOSPITAL_COMMUNITY)
Admission: RE | Admit: 2018-01-26 | Discharge: 2018-01-26 | Disposition: A | Discharge status: Home / Self Care | Payer: Medicare Other | Source: Ambulatory Visit | Attending: Internal Medicine | Admitting: Internal Medicine

## 2018-01-26 ENCOUNTER — Other Ambulatory Visit: Payer: Self-pay

## 2018-01-26 ENCOUNTER — Encounter (HOSPITAL_COMMUNITY): Payer: Self-pay | Admitting: Internal Medicine

## 2018-01-26 VITALS — BP 124/82 | HR 78 | Wt 169.8 lb

## 2018-01-26 DIAGNOSIS — E785 Hyperlipidemia, unspecified: Secondary | ICD-10-CM | POA: Insufficient documentation

## 2018-01-26 DIAGNOSIS — Z7982 Long term (current) use of aspirin: Secondary | ICD-10-CM | POA: Insufficient documentation

## 2018-01-26 DIAGNOSIS — E669 Obesity, unspecified: Secondary | ICD-10-CM | POA: Insufficient documentation

## 2018-01-26 DIAGNOSIS — I5042 Chronic combined systolic (congestive) and diastolic (congestive) heart failure: Secondary | ICD-10-CM | POA: Diagnosis not present

## 2018-01-26 DIAGNOSIS — I2510 Atherosclerotic heart disease of native coronary artery without angina pectoris: Secondary | ICD-10-CM | POA: Insufficient documentation

## 2018-01-26 DIAGNOSIS — Z7984 Long term (current) use of oral hypoglycemic drugs: Secondary | ICD-10-CM | POA: Diagnosis not present

## 2018-01-26 DIAGNOSIS — M722 Plantar fascial fibromatosis: Secondary | ICD-10-CM | POA: Diagnosis not present

## 2018-01-26 DIAGNOSIS — I110 Hypertensive heart disease with heart failure: Secondary | ICD-10-CM | POA: Diagnosis not present

## 2018-01-26 DIAGNOSIS — I429 Cardiomyopathy, unspecified: Secondary | ICD-10-CM | POA: Insufficient documentation

## 2018-01-26 DIAGNOSIS — I5022 Chronic systolic (congestive) heart failure: Secondary | ICD-10-CM

## 2018-01-26 DIAGNOSIS — K449 Diaphragmatic hernia without obstruction or gangrene: Secondary | ICD-10-CM | POA: Diagnosis not present

## 2018-01-26 DIAGNOSIS — G5600 Carpal tunnel syndrome, unspecified upper limb: Secondary | ICD-10-CM | POA: Diagnosis not present

## 2018-01-26 DIAGNOSIS — K219 Gastro-esophageal reflux disease without esophagitis: Secondary | ICD-10-CM | POA: Insufficient documentation

## 2018-01-26 DIAGNOSIS — Z79899 Other long term (current) drug therapy: Secondary | ICD-10-CM | POA: Insufficient documentation

## 2018-01-26 DIAGNOSIS — I493 Ventricular premature depolarization: Secondary | ICD-10-CM | POA: Diagnosis not present

## 2018-01-26 DIAGNOSIS — E119 Type 2 diabetes mellitus without complications: Secondary | ICD-10-CM | POA: Diagnosis not present

## 2018-01-26 DIAGNOSIS — Z87891 Personal history of nicotine dependence: Secondary | ICD-10-CM | POA: Diagnosis not present

## 2018-01-26 MED ORDER — CARVEDILOL 6.25 MG PO TABS: 18.7500 mg | ORAL_TABLET | Freq: Two times a day (BID) | ORAL | 3 refills | Status: DC

## 2018-01-26 MED ORDER — SPIRONOLACTONE 25 MG PO TABS: 12.5000 mg | ORAL_TABLET | Freq: Every day | ORAL | 3 refills | Status: DC

## 2018-01-26 MED ORDER — FUROSEMIDE 20 MG PO TABS: ORAL_TABLET | ORAL | 3 refills | Status: DC

## 2018-01-26 MED ORDER — AMIODARONE HCL 200 MG PO TABS: 200.0000 mg | ORAL_TABLET | Freq: Every day | ORAL | 3 refills | Status: DC

## 2018-01-26 MED ORDER — SACUBITRIL-VALSARTAN 49-51 MG PO TABS: 1.0000 | ORAL_TABLET | Freq: Two times a day (BID) | ORAL | 3 refills | Status: DC

## 2018-01-26 NOTE — Patient Instructions (Signed)
START taking Amiodarone 200 mg (1 Tablet) Once Daily  Follow up in 2 Months

## 2018-01-26 NOTE — Patient Outreach (Signed)
Telephone assessment;  Placed call to patient who reports that she is doing really well. Reports no issues in the last month. Reports weight range of 163-165 pounds.  Reports that she continues to exercise and follow her diet.  Reports that she had testing last week and is on her way to MD office to get results.   Denies any new problems or concerns. Reviewed case closure with patient and she is in agreement.  PLAN: will close case as goals are met. Will send letter to patient and MD. Encouraged patient to call me for future needs.  Tomasa Rand, RN, BSN, CEN [facility] [employer] [phone]

## 2018-01-26 NOTE — Progress Notes (Signed)
Thanks for seeing her Natasha Woodard -- I know she is a handfull explaining things.  Just needs regular close f/u to get her doing well. Bryan Lemma, MD

## 2018-01-26 NOTE — Progress Notes (Signed)
ADVANCED HF CLINIC NOTE  Referring Physician: Herbie Baltimore  Primary Care: Para March Primary Cardiologist: Herbie Baltimore  HPI:  Natasha Woodard is a 77 y.o. female with a h/o HL, HTN, DM2 and systolic HF due to NICM referred by Dr. Herbie Baltimore for further evaluation of her HF.   Natasha Woodard was initially evaluated in the summer 2018 and was noted to have frequent PVCs on EKG with a low resting heart rate. Holter showed 13% PVCs. Echo showed an EF of 20-30% and a Holter monitor which revealed significant amount of ventricular ectopy. She was also referred for right and left heart catheterization back in July that showed minimal CAD with EF 25-30%, LVEDP 26 mmHg with PCWP of only 16 mHg).  She has been treated medically. F/u echo 10/26/2017  2 D Echo 10/26/2017: EF remains 30-35%.  Diffuse hypokinesis noted.  Severe LA dilation. RV normal  Returns for f/u. Going to water aerobics 4 days per week. No SOB with aerobics. No edema, orthopnea or PND. Able to go to grocery store without problem. No edema. orthopnea or PND. No dizziness.  No problems with medicine except for lovastatin. Tolerating Entresto well. Occasionally takes extra lasix for swelling.   CMRI: 01/17/18 IMPRESSION: 1.  Difficult study due to frequent PVCs. 2.  Normal LV size with EF 27%, diffuse hypokinesis 3.  Mildly dilated RV with normal systolic function 4. No myocardial LGE, so no definitive evidence for prior MI, infiltrative disease, or myocarditis. Suspect nonischemic cardiomyopathy, possibly due to PVCs.  Event monitor 01/20/18 Normal sinus rhythm with frequent multifocal PVCs (9%) and PACs. Nighttime bradycardia suggestive of OSA.    Past Medical History:  Diagnosis Date  . Calcific tendonitis 2008   Treatment of left leg  . Cardiomyopathy (HCC) 06/2017   Echo: EF 25 and 30%. GR 1 DD. Mild diastolic dysfunction with elevated LVEDP. Mild valvular disease.  . Chronic combined systolic and diastolic CHF, NYHA class 2 and ACA/AHA  stage C (HCC)   . Coronary artery disease, non-occlusive    mild-moderate CAD 06/30/17 cath  . CTS (carpal tunnel syndrome)   . Diabetes mellitus    Type II  . Dysrhythmia    patient said that she cant remember what it is  . Hiatal hernia    with reflux  . Hyperlipidemia    Statin intolerant  . Hypertension   . Ichthyosis congenita   . NSVD (normal spontaneous vaginal delivery) 1971 & 1972  . Obesity   . Plantar fasciitis    Left  . Pulmonary nodule    Imaged multiple times and benign appearing  . Wears glasses     Current Outpatient Medications  Medication Sig Dispense Refill  . acetaminophen (TYLENOL) 325 MG tablet Take 650 mg by mouth every 6 (six) hours as needed.    Marland Kitchen aspirin 81 MG tablet Take 1 tablet (81 mg total) by mouth daily.    . carvedilol (COREG) 6.25 MG tablet Take 3 tablets (18.75 mg total) by mouth 2 (two) times daily. 540 tablet 1  . Cholecalciferol (VITAMIN D) 1000 UNITS capsule Take 1,000 Units by mouth daily.     . furosemide (LASIX) 20 MG tablet Take 20 mg tablet by mouth daily, may take an additional  20 mg per increase daily weight instruction as needed 135 tablet 3  . glucose blood (ACCU-CHEK AVIVA PLUS) test strip USE TO TEST BLOOD SUGAR ONCE DAILY FOR DM E11.9 100 each 1  . Lancets (ACCU-CHEK MULTICLIX) lancets USE ONE LANCET TO TEST  BLOOD GLUCOSE DAILY FOR DM 250.00 102 each 3  . lovastatin (MEVACOR) 40 MG tablet Take 1 tablet (40 mg total) by mouth at bedtime. 90 tablet 3  . metFORMIN (GLUCOPHAGE) 500 MG tablet TAKE ONE-HALF TABLET BY MOUTH TWICE DAILY WITH MEALS 90 tablet 3  . sacubitril-valsartan (ENTRESTO) 49-51 MG Take 1 tablet by mouth 2 (two) times daily. 60 tablet 3  . spironolactone (ALDACTONE) 25 MG tablet Take 0.5 tablets (12.5 mg total) by mouth daily. 45 tablet 3  . traMADol (ULTRAM) 50 MG tablet TAKE 1 TABLET BY MOUTH EVERY 12 HOURS AS NEEDED 180 tablet 1   No current facility-administered medications for this encounter.     Allergies    Allergen Reactions  . Ezetimibe-Simvastatin Other (See Comments)    REACTION: Muscle aches (side effect)  . Lipitor [Atorvastatin] Other (See Comments)    Leg weakness   . Pravastatin Sodium Other (See Comments)    REACTION: Muscle aches (side effect)  . Sulfonamide Derivatives Nausea And Vomiting      Social History   Socioeconomic History  . Marital status: Married    Spouse name: Not on file  . Number of children: 2  . Years of education: Not on file  . Highest education level: Not on file  Social Needs  . Financial resource strain: Not on file  . Food insecurity - worry: Not on file  . Food insecurity - inability: Not on file  . Transportation needs - medical: Not on file  . Transportation needs - non-medical: Not on file  Occupational History    Employer: RETIRED  Tobacco Use  . Smoking status: Former Smoker    Years: 28.00    Types: Cigarettes    Last attempt to quit: 12/12/1993    Years since quitting: 24.1  . Smokeless tobacco: Never Used  Substance and Sexual Activity  . Alcohol use: No    Alcohol/week: 0.0 oz  . Drug use: No  . Sexual activity: No  Other Topics Concern  . Not on file  Social History Narrative   Two kids, two grandchildren.    Married 1969   Retired.       Family History  Problem Relation Age of Onset  . Heart disease Mother        s/p pacemaker  . Diabetes Mother   . Heart disease Father   . Diabetes Father   . Heart disease Brother        MI  . Cancer Brother        Lung  . Colon cancer Neg Hx   . Breast cancer Neg Hx     Vitals:   01/26/18 1136  BP: 124/82  Pulse: 78  SpO2: 99%  Weight: 169 lb 12 oz (77 kg)    PHYSICAL EXAM: General:  Well appearing. No resp difficulty HEENT: normal Neck: supple. no JVD. Carotids 2+ bilat; no bruits. No lymphadenopathy or thryomegaly appreciated. Cor: PMI nondisplaced. Regular rate & rhythm. With some ectopy.  No rubs, gallops or murmurs. Lungs: clear Abdomen: obese, soft,  nontender, nondistended. No hepatosplenomegaly. No bruits or masses. Good bowel sounds. Extremities: no cyanosis, clubbing, rash, edema Neuro: alert & orientedx3, cranial nerves grossly intact. moves all 4 extremities w/o difficulty. Affect pleasant   ASSESSMENT & PLAN:  1. Chronic systolic HF - due to NICM. Cath 7/18 with minimal CAD. Initial EF 20-25%. Etiology ? Frequent PVCs - last echo (Personally reviewed) EF 30-35%. 11/18 - cMRI EF 27% No LGE. 2/19 -  Holter monitor 2/19 9% PVCs  (Previous holter monitor 7/18 with 13% PVCs) - On low-dose lasix but volume status OK with frequent use of sliding-scale lasix - Continue  entresto 49/51 - continue carvedilol 18.75 bid - continue spiro 12.5 daily - Doing well NYHA I. Volume status looks good.  - Holter data and cMRI results reviewed with her. No evidence of infiltrative process - After long discussion, she has agreed to trial of PVC suppression with low-dose amio 200 daily. Will repeat echo 3-4 months to look for EF recovery   2. Frequent PVCs - Initial monitor with 13% PVCs 7/18. ? Etiology of NICM - F/u holter monitor 2/19 9% PVCs  - As above, start amio 200 daily for trial of PVC suppression  3. HTN - BP control improved with Entresto  4. DM2 - consider Jardiance  Total time spent 45 minutes. Over half that time spent discussing above.    Arvilla Meres, MD  12:01 PM

## 2018-01-30 ENCOUNTER — Telehealth (HOSPITAL_COMMUNITY): Payer: Self-pay | Admitting: *Deleted

## 2018-01-30 ENCOUNTER — Ambulatory Visit (INDEPENDENT_AMBULATORY_CARE_PROVIDER_SITE_OTHER): Payer: Medicare Other | Admitting: Family Medicine

## 2018-01-30 ENCOUNTER — Telehealth: Payer: Self-pay | Admitting: Cardiology

## 2018-01-30 ENCOUNTER — Encounter: Payer: Self-pay | Admitting: *Deleted

## 2018-01-30 ENCOUNTER — Encounter: Payer: Self-pay | Admitting: Family Medicine

## 2018-01-30 VITALS — BP 100/72 | HR 81 | Temp 99.3°F | Ht 62.0 in | Wt 171.2 lb

## 2018-01-30 DIAGNOSIS — R059 Cough, unspecified: Secondary | ICD-10-CM

## 2018-01-30 DIAGNOSIS — R05 Cough: Secondary | ICD-10-CM | POA: Diagnosis not present

## 2018-01-30 DIAGNOSIS — J101 Influenza due to other identified influenza virus with other respiratory manifestations: Secondary | ICD-10-CM | POA: Diagnosis not present

## 2018-01-30 LAB — POC INFLUENZA A&B (BINAX/QUICKVUE)
INFLUENZA B, POC: POSITIVE — AB
Influenza A, POC: NEGATIVE

## 2018-01-30 MED ORDER — OSELTAMIVIR PHOSPHATE 75 MG PO CAPS: 75.0000 mg | ORAL_CAPSULE | Freq: Two times a day (BID) | ORAL | 0 refills | Status: DC

## 2018-01-30 NOTE — Telephone Encounter (Signed)
Patient called c/o " bad cold". Pt has been taking otc medications that have not helped. Patient instructed to contact her PCP for advice. Patient thinks she may have the flu. Patient agreeable with plan.

## 2018-01-30 NOTE — Telephone Encounter (Signed)
PT have gained 5lbs in the last days

## 2018-01-30 NOTE — Telephone Encounter (Signed)
Returned call to patient.She stated she has gained 5 lbs within the past 2 days.Stated she has a bad cold and has been eating chinese soup.Advised of low salt diet no chinese food.Advised ok to take a extra lasix 20 mg for the next 3 days then return to normal dose 20 mg daily.Advised to see PCP for cold.

## 2018-01-30 NOTE — Telephone Encounter (Signed)
Pt have gaines 5lbs in the last 3 days.She was told when this happen to contact the office.She has a  bad cold and been taking Alka Seltzer,she wonder if this might attribute to her weight gain?

## 2018-01-30 NOTE — Progress Notes (Signed)
   Subjective:    Patient ID: Natasha Woodard, female    DOB: 01-Dec-1941, 77 y.o.   MRN: [id number]  Cough  This is a new problem. The current episode started in the past 7 days (2 days). The problem has been gradually worsening. The problem occurs constantly. The cough is productive of sputum. Associated symptoms include chills, myalgias, nasal congestion, a sore throat, shortness of breath and wheezing. Pertinent negatives include no ear congestion, ear pain, headaches or postnasal drip.  Shortness of Breath  Associated symptoms include a sore throat and wheezing. Pertinent negatives include no ear pain or headaches.   Blood pressure 100/72, pulse 81, temperature 99.3 F (37.4 C), temperature source Oral, height 5\' 2"  (1.575 m), weight 171 lb 4 oz (77.7 kg), SpO2 92 %.  Hx:  dilated cardiomyopathy, DM, emphesema she is a former smoker   sick contacts: husband   got the flu shot  Review of Systems  Constitutional: Positive for chills.  HENT: Positive for sore throat. Negative for ear pain and postnasal drip.   Respiratory: Positive for cough, shortness of breath and wheezing.   Musculoskeletal: Positive for myalgias.  Neurological: Negative for headaches.       Objective:   Physical Exam  Constitutional: Vital signs are normal. She appears well-developed and well-nourished. She is cooperative.  Non-toxic appearance. She appears ill. No distress.  HENT:  Head: Normocephalic.  Right Ear: Hearing, tympanic membrane, external ear and ear canal normal. Tympanic membrane is not erythematous, not retracted and not bulging.  Left Ear: Hearing, tympanic membrane, external ear and ear canal normal. Tympanic membrane is not erythematous, not retracted and not bulging.  Nose: Mucosal edema and rhinorrhea present. Right sinus exhibits no maxillary sinus tenderness and no frontal sinus tenderness. Left sinus exhibits no maxillary sinus tenderness and no frontal sinus tenderness.  Mouth/Throat:  Uvula is midline, oropharynx is clear and moist and mucous membranes are normal.  Eyes: Conjunctivae, EOM and lids are normal. Pupils are equal, round, and reactive to light. Lids are everted and swept, no foreign bodies found.  Neck: Trachea normal and normal range of motion. Neck supple. Carotid bruit is not present. No thyroid mass and no thyromegaly present.  Cardiovascular: Normal rate, regular rhythm, S1 normal, S2 normal, normal heart sounds, intact distal pulses and normal pulses. Exam reveals no gallop and no friction rub.  No murmur heard. Pulmonary/Chest: Effort normal and breath sounds normal. No tachypnea. No respiratory distress. She has no decreased breath sounds. She has no wheezes. She has no rhonchi. She has no rales.  Neurological: She is alert.  Skin: Skin is warm, dry and intact. No rash noted.  Psychiatric: Her speech is normal and behavior is normal. Judgment normal. Her mood appears not anxious. Cognition and memory are normal. She does not exhibit a depressed mood.          Assessment & Plan:

## 2018-01-30 NOTE — Patient Instructions (Addendum)
Cerumenx or rubbing alcohol or mineral oil in ears.  Rest, fluids and complete a course of tamiflu.  Can use tylenol for body aches, fever and sore throat.  Can use mucinex DM  or alkaseltzer twice daily for cough.  if shortness of breath not improving follow up.. If severe shortness of breath got to ER.    Influenza, Adult Influenza ("the flu") is an infection in the lungs, nose, and throat (respiratory tract). It is caused by a virus. The flu causes many common cold symptoms, as well as a high fever and body aches. It can make you feel very sick. The flu spreads easily from person to person (is contagious). Getting a flu shot (influenza vaccination) every year is the best way to prevent the flu. Follow these instructions at home:  Take over-the-counter and prescription medicines only as told by your doctor.  Use a cool mist humidifier to add moisture (humidity) to the air in your home. This can make it easier to breathe.  Rest as needed.  Drink enough fluid to keep your pee (urine) clear or pale yellow.  Cover your mouth and nose when you cough or sneeze.  Wash your hands with soap and water often, especially after you cough or sneeze. If you cannot use soap and water, use hand sanitizer.  Stay home from work or school as told by your doctor. Unless you are visiting your doctor, try to avoid leaving home until your fever has been gone for 24 hours without the use of medicine.  Keep all follow-up visits as told by your doctor. This is important. How is this prevented?  Getting a yearly (annual) flu shot is the best way to avoid getting the flu. You may get the flu shot in late summer, fall, or winter. Ask your doctor when you should get your flu shot.  Wash your hands often or use hand sanitizer often.  Avoid contact with people who are sick during cold and flu season.  Eat healthy foods.  Drink plenty of fluids.  Get enough sleep.  Exercise regularly. Contact a doctor  if:  You get new symptoms.  You have: ? Chest pain. ? Watery poop (diarrhea). ? A fever.  Your cough gets worse.  You start to have more mucus.  You feel sick to your stomach (nauseous).  You throw up (vomit). Get help right away if:  You start to be short of breath or have trouble breathing.  Your skin or nails turn a bluish color.  You have very bad pain or stiffness in your neck.  You get a sudden headache.  You get sudden pain in your face or ear.  You cannot stop throwing up. This information is not intended to replace advice given to you by your health care provider. Make sure you discuss any questions you have with your health care provider. Document Released: 09/06/2008 Document Revised: 05/05/2016 Document Reviewed: 09/22/2015 Elsevier Interactive Patient Education  2017 [employer].

## 2018-01-30 NOTE — Assessment & Plan Note (Signed)
Given age sand comorbidities.. Treat with tamiflu. Reviewed flu complications and timeline. Return precautions given.

## 2018-01-31 ENCOUNTER — Ambulatory Visit: Payer: Self-pay | Admitting: *Deleted

## 2018-01-31 ENCOUNTER — Telehealth: Payer: Self-pay | Admitting: Cardiology

## 2018-01-31 NOTE — Telephone Encounter (Signed)
  Reason for Disposition . Caller has NON-URGENT medication question about med that PCP prescribed and triager unable to answer question  Answer Assessment - Initial Assessment Questions 1. SYMPTOMS: "Do you have any symptoms?"     Patient reports she was given Tamiflu yesterday and she has terrible nightmares and hallucinations during the night. 2. SEVERITY: If symptoms are present, ask "Are they mild, moderate or severe?"     Patient wants to know if hallucinations are a side effect of Tamiflu- she had a terrible night and could not sleep. Per [employer]- it is very rare- but it can happen. Patient is upset that she paid $45.00 for the prescription and she is upset that she did not get the injection she has heard about. Told patient that the studies have shown that she could have the same side effects with the Oseltamivir as well. She is going to discontinue the Tamiflu and she wants Dr Para March to call her.  Protocols used: MEDICATION QUESTION CALL-A-AH

## 2018-01-31 NOTE — Telephone Encounter (Signed)
I am sorry the patient had side effects, the ones she reported are very rare.  The is no way to know how a patient will tolerate medications ahead of time.  She has significant comorbidities and was higher risk for serious complicaiton for flu and I felt treatment with tamiflu was best for her. Agree with Dr. Lianne Bushy statement.

## 2018-01-31 NOTE — Telephone Encounter (Signed)
Per Mardelle Matte she can take extra 20mg  of Lasix for the next 2 days. Pt to call clinic if her weight does not go down.  Pt aware and agreeable with plan.

## 2018-01-31 NOTE — Telephone Encounter (Signed)
New message   Pt wants to know if she gained 5 lbs in a week, does she need to increase her lasix in the morning and afternoon? She says she is not having swelling and she has the flu. Please call

## 2018-01-31 NOTE — Telephone Encounter (Signed)
Please call pt.  I'm not in the office today.  I saw the note.  She tested positive for flu and was reasonably prescribed tamiflu.  That medicine was the appropriate med to use, given the available information at the time.  <1% of patients have nightmares on the medicine, so she had >99% chance of not having that side effect.   Had I seen the patient yesterday, I would have done the same thing.  That said, she has an intolerance to the medicine and I listed it on her chart.  Stay off tamiflu. I wouldn't change her meds o/w.   Supportive care o/w in the meantime.  F/u as needed.  I hope she feels better soon.   I support what Dr. Ermalene Searing did yesterday, given the information available to her at the time.

## 2018-01-31 NOTE — Telephone Encounter (Signed)
Patient notified as instructed by telephone and verbalized understanding. Patient stated that she is very upset that she spent $45 for medication that she can not take. Patient stated that she should have been told that nightmares could be a side effect of the mediation and if she had been told that she would have not gotten it. Patient stated that patients should be told that this is a possible side effect before spending money on this medication. Patient said that she went to the pharmacy today and got some Alka-Seltzer to take for her symptoms. Patient stated that she appreciated the call, but that does not change the fact that she wasted her money.

## 2018-01-31 NOTE — Telephone Encounter (Signed)
Patient is an established patient in heart failure clinic, will forward to clinical RN

## 2018-01-31 NOTE — Telephone Encounter (Signed)
Dr Ermalene Searing is out of office and Dr Para March is out of office but Denny Peon said to send to Dr Para March as high priority.

## 2018-01-31 NOTE — Telephone Encounter (Signed)
I get that she didn't want to have a side effect from the medicine, but I still stand by Dr. Daphine Deutscher decision.

## 2018-02-01 ENCOUNTER — Telehealth: Payer: Self-pay | Admitting: Family Medicine

## 2018-02-01 NOTE — Telephone Encounter (Signed)
I called pt but only got her voicemail.  Also created CRM for [facility] to reference if she calls back.

## 2018-02-01 NOTE — Telephone Encounter (Signed)
Pt called back and I attempted to transfer to office and to Triage at [facility], no one was available. Pt was very upset in reference to the situation and stated that a nurse she spoke with yesterday informed her of the type of reaction she had being a side effect of the medication and if she was smart enough to find that information that the doctor should have been. I advised the pt that Dr Para March and Dr. Ermalene Searing would recommend the same course of treatment as indicated in CRM and TE. Pt is requesting a call back from Dr. Ermalene Searing.

## 2018-02-01 NOTE — Telephone Encounter (Signed)
Unable to reach pt by phone; please see triage note from 01/31/18.

## 2018-02-01 NOTE — Telephone Encounter (Signed)
Patient says that Dr. Ermalene Searing prescribed Tamiflu for her and she only took 2 pills and had severe hallucinations and cannot take any more of the medication.  Patient asks why this medication was prescribed knowing that this could be a side effect in elderly patients.  Patient also asks if Dr. Ermalene Searing could reimburse the $45 that she paid for the medication that she was not able to take and that she should have known had those side effects.

## 2018-02-01 NOTE — Telephone Encounter (Signed)
Called pt.. Left a message on voicemail on her cell for her to call back tomorrow and I will try to reach her again in the morning.

## 2018-02-01 NOTE — Telephone Encounter (Signed)
Dr. Leonard Schwartz- let me know if I can be of use.  Thanks.

## 2018-02-01 NOTE — Telephone Encounter (Signed)
Natasha Woodard.. Please call this pt to deliver/reinforce Dr. Lianne Bushy and my message ( see yesterday's phone note as well). No error was made, I made a judgement as I think was appropriate given her age and co-morbidities put her at greater risk for complications from flu. He SE per Uptodate occur < 1% of cases, we cannot tell who will have SE ahead of time and I reviewed potential side effects including mood change and my reasoning  for tamiflu use at her OV.  Contact risk management if you think it is appropriate given her concern.

## 2018-02-01 NOTE — Telephone Encounter (Signed)
Dr. Ermalene Searing gave her appropriate treatment.  She gave her the right medicine to try to prevent her from getting sicker from the flu.  I would have done the same thing.   If patient wants to come an and talk about it, then schedule her.  I don't have anything else to offer.   This is an rare side effect.  I will not reimburse her and I don't expect anyone else to, either.

## 2018-02-01 NOTE — Telephone Encounter (Signed)
Copied from CRM [id number]. Topic: Inquiry >> Feb 01, 2018 10:53 AM Raquel Sarna wrote: Pt needs to discuss Tamiflu and why it didn't work and caused more issues.

## 2018-02-02 NOTE — Telephone Encounter (Signed)
Discussed tamiflu use with pt in detail. Answered questions and allowed her to voice concerns.  She does feel as though her flu symptoms are turning the corer somewhat, no SOB.

## 2018-02-05 NOTE — Telephone Encounter (Signed)
Spoke with patient and discussed her medications. Also spoke with Cicero Duck (clinic pharmacist) and scheduled pt an appointment with PharmD clinic on 02/14/18 at 2pm.

## 2018-02-05 NOTE — Telephone Encounter (Signed)
New message  Pt verbalized that she is calling for the RN  Pt want rn to go over her medications and refer he to a pharmacist to manage medications

## 2018-02-09 ENCOUNTER — Other Ambulatory Visit: Payer: Self-pay | Admitting: Family Medicine

## 2018-02-09 NOTE — Telephone Encounter (Signed)
Last filled 08/21/2017 with 1 refill... Please advise

## 2018-02-11 NOTE — Telephone Encounter (Signed)
Sent. Thanks.   

## 2018-02-14 ENCOUNTER — Ambulatory Visit (HOSPITAL_COMMUNITY): Payer: Medicare Other

## 2018-02-19 ENCOUNTER — Ambulatory Visit (HOSPITAL_COMMUNITY)
Admission: RE | Admit: 2018-02-19 | Discharge: 2018-02-19 | Disposition: A | Discharge status: Home / Self Care | Payer: Medicare Other | Source: Ambulatory Visit | Attending: Cardiology | Admitting: Cardiology

## 2018-02-19 VITALS — BP 152/82 | HR 70 | Wt 170.8 lb

## 2018-02-19 DIAGNOSIS — Z79899 Other long term (current) drug therapy: Secondary | ICD-10-CM | POA: Diagnosis not present

## 2018-02-19 DIAGNOSIS — I5022 Chronic systolic (congestive) heart failure: Secondary | ICD-10-CM | POA: Insufficient documentation

## 2018-02-19 DIAGNOSIS — I110 Hypertensive heart disease with heart failure: Secondary | ICD-10-CM | POA: Diagnosis not present

## 2018-02-19 DIAGNOSIS — I5042 Chronic combined systolic (congestive) and diastolic (congestive) heart failure: Secondary | ICD-10-CM

## 2018-02-19 DIAGNOSIS — E785 Hyperlipidemia, unspecified: Secondary | ICD-10-CM | POA: Insufficient documentation

## 2018-02-19 DIAGNOSIS — I493 Ventricular premature depolarization: Secondary | ICD-10-CM | POA: Insufficient documentation

## 2018-02-19 DIAGNOSIS — E119 Type 2 diabetes mellitus without complications: Secondary | ICD-10-CM | POA: Diagnosis not present

## 2018-02-19 LAB — COMPREHENSIVE METABOLIC PANEL
ALT: 9 U/L — ABNORMAL LOW (ref 14–54)
ANION GAP: 7 (ref 5–15)
AST: 20 U/L (ref 15–41)
Albumin: 4 g/dL (ref 3.5–5.0)
Alkaline Phosphatase: 51 U/L (ref 38–126)
BUN: 23 mg/dL — ABNORMAL HIGH (ref 6–20)
CHLORIDE: 103 mmol/L (ref 101–111)
CO2: 28 mmol/L (ref 22–32)
Calcium: 9.7 mg/dL (ref 8.9–10.3)
Creatinine, Ser: 1.19 mg/dL — ABNORMAL HIGH (ref 0.44–1.00)
GFR, EST AFRICAN AMERICAN: 50 mL/min — AB (ref >60)
GFR, EST NON AFRICAN AMERICAN: 43 mL/min — AB (ref >60)
Glucose, Bld: 88 mg/dL (ref 65–99)
Potassium: 5 mmol/L (ref 3.5–5.1)
SODIUM: 138 mmol/L (ref 135–145)
Total Bilirubin: 0.9 mg/dL (ref 0.3–1.2)
Total Protein: 6.8 g/dL (ref 6.5–8.1)

## 2018-02-19 LAB — TSH: TSH: 2.27 u[IU]/mL (ref 0.350–4.500)

## 2018-02-19 MED ORDER — CARVEDILOL 25 MG PO TABS: 25.0000 mg | ORAL_TABLET | Freq: Two times a day (BID) | ORAL | 5 refills | Status: DC

## 2018-02-19 NOTE — Patient Instructions (Addendum)
It was great to see you today!  Please INCREASE your carvedilol to 25 mg tablet (1 tablet) TWICE DAILY.  Your goal heart rate/pulse is < 70.   Blood work today. We will call you with any changes.   Please keep your appointment with Dr. Gala Romney on 03/12/18.

## 2018-02-19 NOTE — Progress Notes (Signed)
HF MD: Gala Romney  HPI:  Natasha Woodard a 77 y.o.African American femalewith a h/o HLD, HTN, DM2 and NYHA I systolic HF due to NICM.  Natasha Woodard was initially evaluated in the summer 2018 and was noted to have frequent PVCs on EKG with a low resting heart rate. Holter showed 13% PVCs. Echo showed an EF of 20-30% and a Holter monitor which revealed significant amount of ventricular ectopy. She was also referred for right and left heart catheterization back in Julythat showed minimal CAD with EF 25-30%, LVEDP 26 mmHg with PCWP of only 16 mHg).  She has been treated medically.   2 D Echo 10/26/2017:EF remains 30-35%. Diffuse hypokinesis noted. Severe LA dilation. RV normal  CMRI 01/17/18: Normal LV size with EF 27%  At last visit on 01/26/18, Holter monitor showed 9% PVCs, trial of PVC suppression with amiodarone 200mg  QD started. She stated that she was going to water aerobics 4 days per week without SOB and was able to go to the grocery store without any issues. She was tolerating Entresto and occasionally taking extra Lasix for swelling.  Patient arrives today in good spirits with several questions about her heart failure and medications. Patient states that she didn't take amiodarone for 2 weeks due to fears of medication based on reviews she read online. Patient stated that she was only taking lovastatin some days per Dr. Herbie Baltimore, but that she would begin taking daily.    Shortness of breath/dyspnea on exertion? yes- only with walking, still doing water aerobics 4 days/week (1 hour each day)   Orthopnea/PND? no  Edema? no  Lightheadedness/dizziness? no  Daily weights at home? yes  Blood pressure/heart rate monitoring at home? no  Following low-sodium/fluid-restricted diet? yes  HF Medications: Carvedilol 6.25mg  3 tablets (18.75mg ) PO BID Furosemide 20mg  PO QD, add. 20mg  PRN weight/edema - no PRN recently  Entresto 49-51mg  PO BID Spironolactone 12.5mg  PO QD  Has the  patient been experiencing any side effects to the medications prescribed?  no  Does the patient have any problems obtaining medications due to transportation or finances?   no - [employer] Medicare   Understanding of regimen: good Understanding of indications: good Potential of compliance: good Patient understands to avoid NSAIDs. Patient understands to avoid decongestants.   Pertinent Lab Values:  02/19/18: Serum creatinine 1.19 (BL 0.9-1), CO2 28, Potassium 5.0, Sodium 138  02/19/18: LFTs wnl, TSH wnl   01/01/18: BNP 281.6  Vital Signs:  Weight: 170.8lb (dry weight: 165lb)  Blood pressure: 152/82 mmHg  Heart rate: 70 bpm   Assessment: 1. Chronicsystolic CHF (EF 16-24%), due to NICM. NYHA class IIsymptoms. - Volume status stable (no edema on exam, no SOB) - Increase carvedilol to goal dose 25 mg BID - Continue furosemide 20 mg daily with additional PRN for weight gain/edema, Entresto 49-51 mg BID, spironolactone 12.5 mg daily  - Basic disease state pathophysiology, medication indication, mechanism and side effects reviewed at length with patient and she verbalized understanding. 2. HLD -Patient reports she was told she didn't have to take lovastatin every day, re-iterated importance of taking daily.  3. Frequent PVCs - Initial monitor with 13% PVCs 7/18. ? Etiology of NICM - F/u holter monitor 2/19 9% PVCs  - Continue amio 200 daily for trial of PVC suppression    -- Had a long discussion about the risks/benefits of amiodarone and she feels better about taking for the time being - CMET/TSH wnl today  4. HTN - BP control improved with Entresto 5.  DM2 - Per PCP - Consider Jardiance    Plan: 1) Medication changes: Based on clinical presentation, vital signs and recent labs will increase carvedilol to 25mg  daily 2) Labs: LFTs, TSH today  3) Follow-up: Dr. Gala Romney on 03/12/18   Tyler Deis. Bonnye Fava, PharmD, BCPS, CPP Clinical Pharmacist Pager: [phone] Phone:  [phone] 02/19/2018 12:16 PM

## 2018-03-08 ENCOUNTER — Telehealth: Payer: Self-pay | Admitting: Internal Medicine

## 2018-03-08 NOTE — Telephone Encounter (Signed)
3 attempts to schedule fu appt from recall list.   Deleting recall.   

## 2018-03-11 ENCOUNTER — Other Ambulatory Visit: Payer: Self-pay | Admitting: Family Medicine

## 2018-03-11 ENCOUNTER — Telehealth: Payer: Self-pay | Admitting: Family Medicine

## 2018-03-11 DIAGNOSIS — E119 Type 2 diabetes mellitus without complications: Secondary | ICD-10-CM

## 2018-03-11 NOTE — Telephone Encounter (Signed)
Pt put in a request on mychart asking about when she needs to do cholesterol testing. When would you like for me to schedule her?  Natasha Woodard  ======================== Schedule labs ahead of visit, orders are in.  No point in rechecking lipids if not back on statin.  Thanks.

## 2018-03-12 ENCOUNTER — Encounter (HOSPITAL_COMMUNITY): Payer: Self-pay | Admitting: Internal Medicine

## 2018-03-12 ENCOUNTER — Ambulatory Visit (HOSPITAL_COMMUNITY)
Admission: RE | Admit: 2018-03-12 | Discharge: 2018-03-12 | Disposition: A | Discharge status: Home / Self Care | Payer: Medicare Other | Source: Ambulatory Visit | Attending: Internal Medicine | Admitting: Internal Medicine

## 2018-03-12 VITALS — BP 121/61 | HR 72 | Wt 168.1 lb

## 2018-03-12 DIAGNOSIS — Z882 Allergy status to sulfonamides status: Secondary | ICD-10-CM | POA: Insufficient documentation

## 2018-03-12 DIAGNOSIS — Z7982 Long term (current) use of aspirin: Secondary | ICD-10-CM | POA: Insufficient documentation

## 2018-03-12 DIAGNOSIS — I429 Cardiomyopathy, unspecified: Secondary | ICD-10-CM | POA: Insufficient documentation

## 2018-03-12 DIAGNOSIS — Z888 Allergy status to other drugs, medicaments and biological substances status: Secondary | ICD-10-CM | POA: Diagnosis not present

## 2018-03-12 DIAGNOSIS — Z7984 Long term (current) use of oral hypoglycemic drugs: Secondary | ICD-10-CM | POA: Insufficient documentation

## 2018-03-12 DIAGNOSIS — I2510 Atherosclerotic heart disease of native coronary artery without angina pectoris: Secondary | ICD-10-CM | POA: Insufficient documentation

## 2018-03-12 DIAGNOSIS — I110 Hypertensive heart disease with heart failure: Secondary | ICD-10-CM | POA: Diagnosis not present

## 2018-03-12 DIAGNOSIS — K449 Diaphragmatic hernia without obstruction or gangrene: Secondary | ICD-10-CM | POA: Diagnosis not present

## 2018-03-12 DIAGNOSIS — Z801 Family history of malignant neoplasm of trachea, bronchus and lung: Secondary | ICD-10-CM | POA: Insufficient documentation

## 2018-03-12 DIAGNOSIS — I5022 Chronic systolic (congestive) heart failure: Secondary | ICD-10-CM

## 2018-03-12 DIAGNOSIS — G5600 Carpal tunnel syndrome, unspecified upper limb: Secondary | ICD-10-CM | POA: Diagnosis not present

## 2018-03-12 DIAGNOSIS — Z87891 Personal history of nicotine dependence: Secondary | ICD-10-CM | POA: Diagnosis not present

## 2018-03-12 DIAGNOSIS — Z79891 Long term (current) use of opiate analgesic: Secondary | ICD-10-CM | POA: Diagnosis not present

## 2018-03-12 DIAGNOSIS — Z8249 Family history of ischemic heart disease and other diseases of the circulatory system: Secondary | ICD-10-CM | POA: Insufficient documentation

## 2018-03-12 DIAGNOSIS — E669 Obesity, unspecified: Secondary | ICD-10-CM | POA: Insufficient documentation

## 2018-03-12 DIAGNOSIS — E785 Hyperlipidemia, unspecified: Secondary | ICD-10-CM | POA: Insufficient documentation

## 2018-03-12 DIAGNOSIS — E119 Type 2 diabetes mellitus without complications: Secondary | ICD-10-CM | POA: Insufficient documentation

## 2018-03-12 DIAGNOSIS — Z79899 Other long term (current) drug therapy: Secondary | ICD-10-CM | POA: Insufficient documentation

## 2018-03-12 DIAGNOSIS — I5042 Chronic combined systolic (congestive) and diastolic (congestive) heart failure: Secondary | ICD-10-CM | POA: Insufficient documentation

## 2018-03-12 DIAGNOSIS — I493 Ventricular premature depolarization: Secondary | ICD-10-CM | POA: Diagnosis not present

## 2018-03-12 DIAGNOSIS — Z833 Family history of diabetes mellitus: Secondary | ICD-10-CM | POA: Diagnosis not present

## 2018-03-12 MED ORDER — SACUBITRIL-VALSARTAN 97-103 MG PO TABS: 1.0000 | ORAL_TABLET | Freq: Two times a day (BID) | ORAL | 6 refills | Status: DC

## 2018-03-12 NOTE — Progress Notes (Signed)
ADVANCED HF CLINIC NOTE  Referring Physician: Herbie Woodard  Primary Care: Natasha Woodard Primary Cardiologist: Natasha Woodard  HPI:  Natasha Woodard is a 77 y.o. female with a h/o HL, HTN, DM2 and systolic HF due to NICM referred by Dr. Herbie Woodard for further evaluation of her HF.   Natasha Woodard was initially evaluated in the summer 2018 and was noted to have frequent PVCs on EKG with a low resting heart rate. Holter showed 13% PVCs. Echo showed an EF of 20-30% and a Holter monitor which revealed significant amount of ventricular ectopy. She was also referred for right and left heart catheterization back in July that showed minimal CAD with EF 25-30%, LVEDP 26 mmHg with PCWP of only 16 mHg).  She has been treated medically. F/u echo 10/26/2017  2 D Echo 10/26/2017: EF remains 30-35%.  Diffuse hypokinesis noted.  Severe LA dilation. RV normal  She returns today for regular follow up. She is still going to water aerobics 4 days/week. No SOB or CP with water aerobics. No orthopnea, PND, or edema. No CP or dizziness. Weights at home: 166-168 lbs. Notices weight gain with salty foods and takes extra lasix ~1x/month. Compliant with meds. Trying to limit salt, but knows she can do better. Ate at [facility] Friday. Not doing well with fluid restriction.   CMRI: 01/17/18 IMPRESSION: 1.  Difficult study due to frequent PVCs. 2.  Normal LV size with EF 27%, diffuse hypokinesis 3.  Mildly dilated RV with normal systolic function 4. No myocardial LGE, so no definitive evidence for prior MI, infiltrative disease, or myocarditis. Suspect nonischemic cardiomyopathy, possibly due to PVCs.  Event monitor 01/20/18 Normal sinus rhythm with frequent multifocal PVCs (9%) and PACs. Nighttime bradycardia suggestive of OSA.    Past Medical History:  Diagnosis Date  . Calcific tendonitis 2008   Treatment of left leg  . Cardiomyopathy (HCC) 06/2017   Echo: EF 25 and 30%. GR 1 DD. Mild diastolic dysfunction with elevated LVEDP.  Mild valvular disease.  . Chronic combined systolic and diastolic CHF, NYHA class 2 and ACA/AHA stage C (HCC)   . Coronary artery disease, non-occlusive    mild-moderate CAD 06/30/17 cath  . CTS (carpal tunnel syndrome)   . Diabetes mellitus    Type II  . Dysrhythmia    patient said that she cant remember what it is  . Hiatal hernia    with reflux  . Hyperlipidemia    Statin intolerant  . Hypertension   . Ichthyosis congenita   . NSVD (normal spontaneous vaginal delivery) 1971 & 1972  . Obesity   . Plantar fasciitis    Left  . Pulmonary nodule    Imaged multiple times and benign appearing  . Wears glasses     Current Outpatient Medications  Medication Sig Dispense Refill  . acetaminophen (TYLENOL) 325 MG tablet Take 650 mg by mouth every 6 (six) hours as needed.    Marland Kitchen aspirin 81 MG tablet Take 1 tablet (81 mg total) by mouth daily.    . carvedilol (COREG) 25 MG tablet Take 1 tablet (25 mg total) by mouth 2 (two) times daily. 60 tablet 5  . Cholecalciferol (VITAMIN D) 1000 UNITS capsule Take 1,000 Units by mouth daily.     . furosemide (LASIX) 20 MG tablet Take 20 mg tablet by mouth daily, may take an additional  20 mg per increase daily weight instruction as needed 135 tablet 3  . glucose blood (ACCU-CHEK AVIVA PLUS) test strip USE TO TEST BLOOD  SUGAR ONCE DAILY FOR DM E11.9 100 each 1  . Lancets (ACCU-CHEK MULTICLIX) lancets USE ONE LANCET TO TEST BLOOD GLUCOSE DAILY FOR DM 250.00 102 each 3  . lovastatin (MEVACOR) 40 MG tablet Take 1 tablet (40 mg total) by mouth at bedtime. 90 tablet 3  . metFORMIN (GLUCOPHAGE) 500 MG tablet TAKE ONE-HALF TABLET BY MOUTH TWICE DAILY WITH MEALS 90 tablet 3  . sacubitril-valsartan (ENTRESTO) 49-51 MG Take 1 tablet by mouth 2 (two) times daily. 180 tablet 3  . spironolactone (ALDACTONE) 25 MG tablet Take 0.5 tablets (12.5 mg total) by mouth daily. 45 tablet 3  . traMADol (ULTRAM) 50 MG tablet TAKE 1 TABLET BY MOUTH EVERY 12 HOURS AS NEEDED 180  tablet 1  . amiodarone (PACERONE) 200 MG tablet Take 1 tablet (200 mg total) by mouth daily. 90 tablet 3   No current facility-administered medications for this encounter.     Allergies  Allergen Reactions  . Ezetimibe-Simvastatin Other (See Comments)    REACTION: Muscle aches (side effect)  . Lipitor [Atorvastatin] Other (See Comments)    Leg weakness   . Tamiflu [Oseltamivir Phosphate] Other (See Comments)    nightmares  . Pravastatin Sodium Other (See Comments)    REACTION: Muscle aches (side effect)  . Sulfonamide Derivatives Nausea And Vomiting      Social History   Socioeconomic History  . Marital status: Married    Spouse name: Not on file  . Number of children: 2  . Years of education: Not on file  . Highest education level: Not on file  Occupational History    Employer: RETIRED  Social Needs  . Financial resource strain: Not on file  . Food insecurity:    Worry: Not on file    Inability: Not on file  . Transportation needs:    Medical: Not on file    Non-medical: Not on file  Tobacco Use  . Smoking status: Former Smoker    Years: 28.00    Types: Cigarettes    Last attempt to quit: 12/12/1993    Years since quitting: 24.2  . Smokeless tobacco: Never Used  Substance and Sexual Activity  . Alcohol use: No    Alcohol/week: 0.0 oz  . Drug use: No  . Sexual activity: Never  Lifestyle  . Physical activity:    Days per week: Not on file    Minutes per session: Not on file  . Stress: Not on file  Relationships  . Social connections:    Talks on phone: Not on file    Gets together: Not on file    Attends religious service: Not on file    Active member of club or organization: Not on file    Attends meetings of clubs or organizations: Not on file    Relationship status: Not on file  . Intimate partner violence:    Fear of current or ex partner: Not on file    Emotionally abused: Not on file    Physically abused: Not on file    Forced sexual activity: Not  on file  Other Topics Concern  . Not on file  Social History Narrative   Two kids, two grandchildren.    Married 1969   Retired.       Family History  Problem Relation Age of Onset  . Heart disease Mother        s/p pacemaker  . Diabetes Mother   . Heart disease Father   . Diabetes Father   .  Heart disease Brother        MI  . Cancer Brother        Lung  . Colon cancer Neg Hx   . Breast cancer Neg Hx     Vitals:   03/12/18 1059  BP: 121/61  Pulse: 72  SpO2: 97%  Weight: 168 lb 1.9 oz (76.3 kg)   Filed Weights   03/12/18 1059  Weight: 168 lb 1.9 oz (76.3 kg)    PHYSICAL EXAM: General:  Well appearing. No resp difficulty HEENT: normal Neck: supple. no JVD. Carotids 2+ bilat; no bruits. No lymphadenopathy or thryomegaly appreciated. Cor: PMI nondisplaced. Regular rate & rhythm. No rubs, gallops or murmurs. Lungs: clear Abdomen: obese soft, nontender, nondistended. No hepatosplenomegaly. No bruits or masses. Good bowel sounds. Extremities: no cyanosis, clubbing, rash, edema Neuro: alert & orientedx3, cranial nerves grossly intact. moves all 4 extremities w/o difficulty. Affect pleasant    ASSESSMENT & PLAN:  1. Chronic systolic HF - due to NICM. Cath 7/18 with minimal CAD. Initial EF 20-25%. Etiology ? Frequent PVCs - last echo (Personally reviewed) EF 30-35%. 11/18 - cMRI EF 27% No LGE. 2/19 - Holter monitor 2/19 9% PVCs  (Previous holter monitor 7/18 with 13% PVCs) - On low-dose lasix but volume status OK with frequent use of sliding-scale lasix - Continue entresto 49/51 - continue carvedilol 18.75 bid - continue spiro 12.5 daily - Doing well NYHA I. Volume status looks good.  - After long discussion, she has agreed to trial of PVC suppression with low-dose amio 200 daily. Will repeat echo 3-4 months to look for EF recovery  - She started mid Woodard.   2. Frequent PVCs - Initial monitor with 13% PVCs 7/18. ? Etiology of NICM - F/u holter monitor 2/19 9%  PVCs  - As above, start amio 200 daily for trial of PVC suppression  3. HTN - BP control improved with Entresto  4. DM2 - consider Jardiance  Total time spent 45 minutes. Over half that time spent discussing above.    Alford Highland, NP  11:21 AM   Patient seen and examined with the above-signed Advanced Practice Provider and/or Housestaff. I personally reviewed laboratory data, imaging studies and relevant notes. I independently examined the patient and formulated the important aspects of the plan. I have edited the note to reflect any of my changes or salient points. I have personally discussed the plan with the patient and/or family.  Suspect she may have PVC cardiomyopathy. At last visit started on amiodarone. PVCs appear suppressed on exam. Doing well from functional standpoint. NYHA II. Volume status looks good. Will increase Entresto to 97/103. Change lasix to prn only. Will repeat echo in 2 months to look for changes in EF.   Arvilla Meres, MD  11:50 AM

## 2018-03-12 NOTE — Patient Instructions (Signed)
Increase Entresto to 97/103 Twice daily   Take Furosemide only AS NEEDED  Your physician recommends that you schedule a follow-up appointment in: 2-3 months with echocardiogram

## 2018-03-12 NOTE — Addendum Note (Signed)
Encounter addended by: Noralee Space, RN on: 03/12/2018 12:03 PM  Actions taken: Visit diagnoses modified, Order list changed, Diagnosis association updated, Sign clinical note

## 2018-03-15 ENCOUNTER — Other Ambulatory Visit (INDEPENDENT_AMBULATORY_CARE_PROVIDER_SITE_OTHER): Payer: Medicare Other

## 2018-03-15 DIAGNOSIS — E119 Type 2 diabetes mellitus without complications: Secondary | ICD-10-CM

## 2018-03-15 LAB — HEMOGLOBIN A1C: Hgb A1c MFr Bld: 6.5 % (ref 4.6–6.5)

## 2018-03-15 LAB — LIPID PANEL
CHOL/HDL RATIO: 4
Cholesterol: 139 mg/dL (ref 0–200)
HDL: 36.7 mg/dL — ABNORMAL LOW (ref >39.00)
LDL CALC: 74 mg/dL (ref 0–99)
NONHDL: 102.36
Triglycerides: 141 mg/dL (ref 0.0–149.0)
VLDL: 28.2 mg/dL (ref 0.0–40.0)

## 2018-03-19 ENCOUNTER — Telehealth (HOSPITAL_COMMUNITY): Payer: Self-pay | Admitting: Pharmacist

## 2018-03-19 NOTE — Telephone Encounter (Signed)
Natasha Woodard called wanting to know what the results of her TSH/CMET meant. I have called and left a VM that the results were within normal range meaning that her amiodarone is not having an effect on her thyroid or liver at this time.   Tyler Deis. Bonnye Fava, PharmD, BCPS, CPP Clinical Pharmacist Phone: [phone] 03/19/2018 9:33 AM

## 2018-03-19 NOTE — Telephone Encounter (Signed)
Natasha Woodard had multiple questions about her recent lab work and the implications of her results. I reassured her that her lab work looked great and none of her results were concerning. She also asked if there was something she could do to help her sleep better at night. I have recommended trying melatonin OTC nightly to see if this makes a difference. Otherwise, have advised her to see her PCP for further recommendations.   Tyler Deis. Bonnye Fava, PharmD, BCPS, CPP Clinical Pharmacist Phone: [phone] 03/19/2018 3:08 PM

## 2018-03-20 ENCOUNTER — Ambulatory Visit (INDEPENDENT_AMBULATORY_CARE_PROVIDER_SITE_OTHER): Payer: Medicare Other | Admitting: Family Medicine

## 2018-03-20 ENCOUNTER — Encounter: Payer: Self-pay | Admitting: Family Medicine

## 2018-03-20 DIAGNOSIS — H903 Sensorineural hearing loss, bilateral: Secondary | ICD-10-CM | POA: Insufficient documentation

## 2018-03-20 DIAGNOSIS — H6123 Impacted cerumen, bilateral: Secondary | ICD-10-CM | POA: Diagnosis not present

## 2018-03-20 DIAGNOSIS — E119 Type 2 diabetes mellitus without complications: Secondary | ICD-10-CM

## 2018-03-20 MED ORDER — FUROSEMIDE 20 MG PO TABS: ORAL_TABLET | ORAL | Status: DC

## 2018-03-20 NOTE — Progress Notes (Signed)
Diabetes:  Using medications without difficulties:yes Hypoglycemic episodes:no Hyperglycemic episodes:no Feet problems:no Blood Sugars averaging: usually ~80-100s eye exam within last year: done fall 2018.  A1c stable, at goal.    She has seen the heart clinic per routine.  She is able to tolerate lovastatin as is without aches.  Lipids improved.  Labs d/w pt.    She is doing aerobics 4 days a week and working on her diet.  D/w pt.   PMH and SH reviewed  Meds, vitals, and allergies reviewed.   ROS: Per HPI unless specifically indicated in ROS section   GEN: nad, alert and oriented HEENT: mucous membranes moist NECK: supple w/o LA CV: rrr. PULM: ctab, no inc wob ABD: soft, +bs EXT: no edema SKIN: chronic changes at baseline.

## 2018-03-20 NOTE — Patient Instructions (Signed)
Recheck in about 3-4 months.  The only lab you need to have done for your next diabetic visit is an A1c.  We can do this with a fingerstick test at the office visit.  You do not need a lab visit ahead of time for this.  It does not matter if you are fasting when the lab is done.   Thanks for your effort.   Don't change your meds for now.  Take care.  Glad to see you.

## 2018-03-21 NOTE — Assessment & Plan Note (Signed)
A1c stable, at goal.   Continue as is.  Recheck periodically.  She is able to tolerate lovastatin as is without aches.  Lipids improved.  Labs d/w pt.  She is doing aerobics 4 days a week and working on her diet.  Continue as is.  I thank the patient for her effort and for all involved in her care.

## 2018-04-26 IMAGING — MR MR CARD MORPHOLOGY WO/W CM
9 of 11 series · 36 of 40 positions shown · IV contrast (multihance)
Comparison: none

CLINICAL DATA: Cardiomyopathy of uncertain etiology.

EXAM:
CARDIAC MRI
TECHNIQUE: The patient was scanned on a 1.5 Tesla GE magnet. A dedicated
cardiac coil was used. Functional imaging was done using Fiesta
sequences. [DATE], and 4 chamber views were done to assess for RWMA's.
Modified Gjovalini rule using a short axis stack was used to
calculate an ejection fraction on a dedicated work station using
Circle software. The patient received 25 cc of Multihance. After 10
minutes inversion recovery sequences were used to assess for
infiltration and scar tissue.
CONTRAST:  25 cc Multihance

[Series 3: bSSFP · sagittal · 8.0mm · 1.37mm/px · 1 of 20 slices shown (1 of 4)]
[im 1/20]
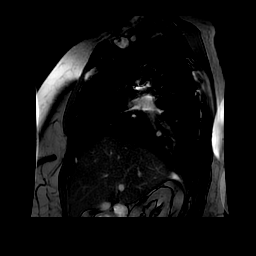

[Series 4: bSSFP · axial · 8.0mm · 1.37mm/px · 1 of 20 slices shown (2 of 4)]
[im 1/20]
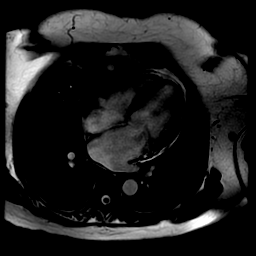

[Series 5: bSSFP · oblique · 8.0mm · 1.25mm/px · 19 of 280 slices shown (3 of 4)]
[im 1/280]
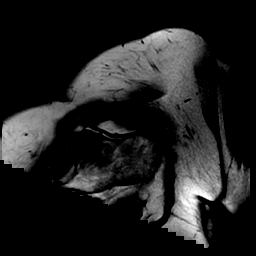
[im 16/280]
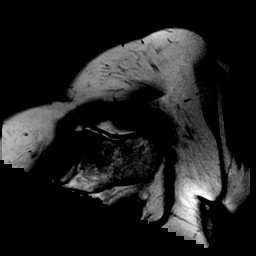
[im 32/280]
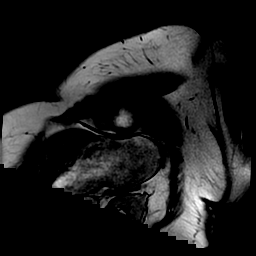
[im 47/280]
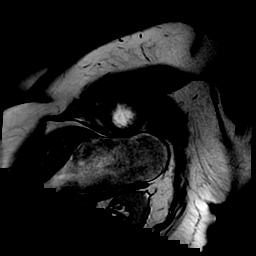
[im 63/280]
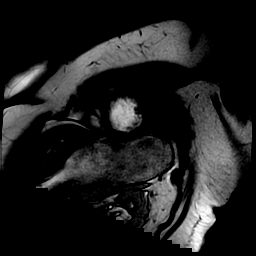
[im 78/280]
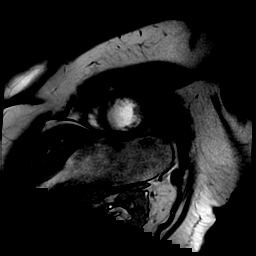
[im 94/280]
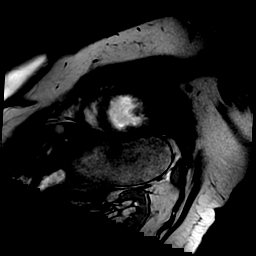
[im 109/280]
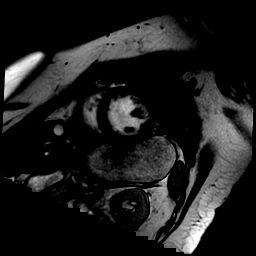
[im 125/280]
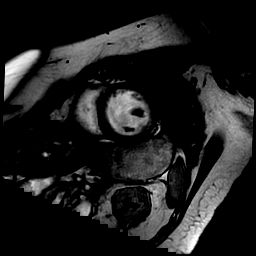
[im 140/280]
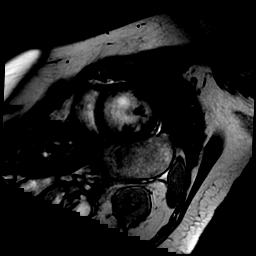
[im 156/280]
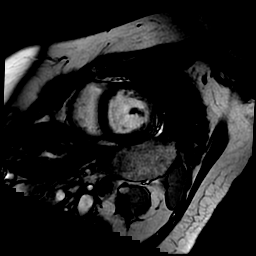
[im 171/280]
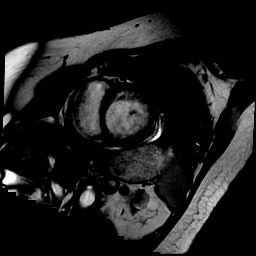
[im 187/280]
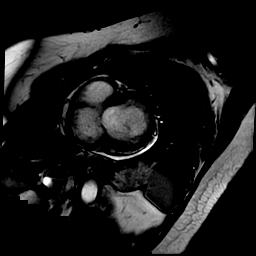
[im 202/280]
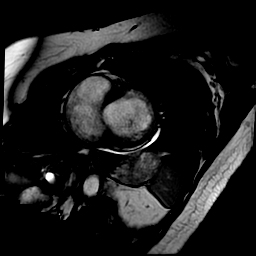
[im 218/280]
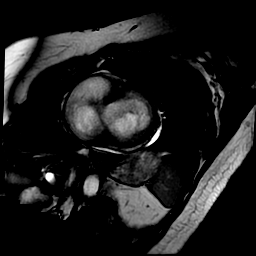
[im 233/280]
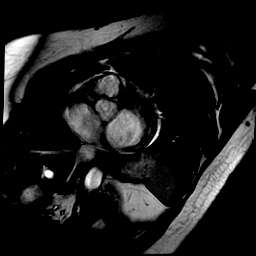
[im 249/280]
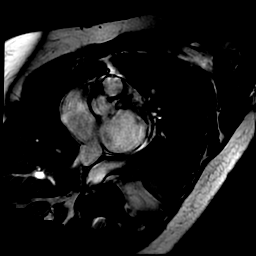
[im 264/280]
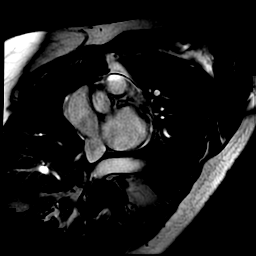
[im 280/280]
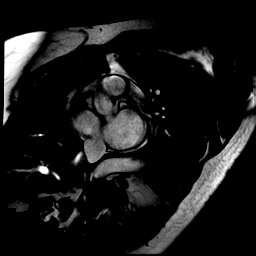

[Series 6: T1 · oblique · 8.0mm · 1.25mm/px · 1 of 12 slices shown]
[im 1/12]
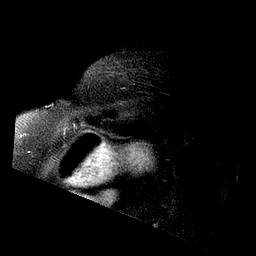

[Series 7: bSSFP · axial · 8.0mm · 1.21mm/px · z∈[-49,+181]mm · 4 of 60 slices shown (4 of 4)]
[im 1/60]
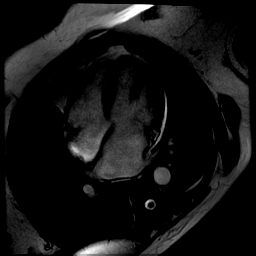
[im 20/60]
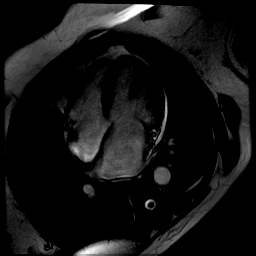
[im 40/60]
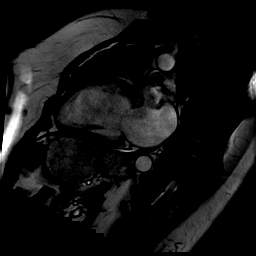
[im 60/60]
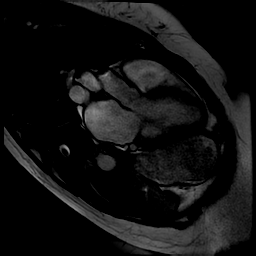

[Series 10: qpqs (id) · axial · 8.0mm · 1.48mm/px · z∈[+93,+93]mm · 4 of 60 slices shown]
[im 1/60]
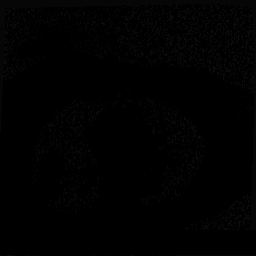
[im 20/60]
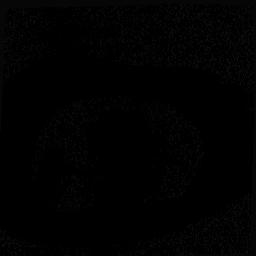
[im 40/60]
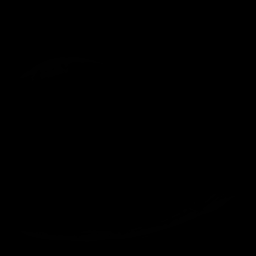
[im 60/60]
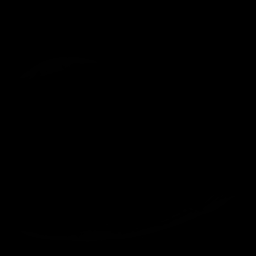

[Series 12: tags grid sa · oblique · 8.0mm · 1.37mm/px · 4 of 60 slices shown]
[im 1/60]
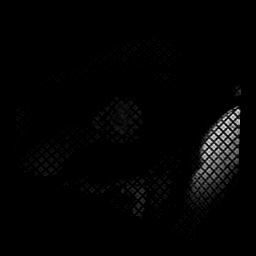
[im 20/60]
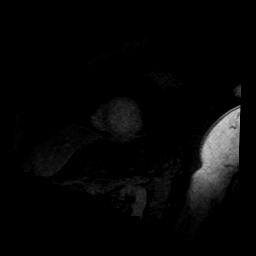
[im 40/60]
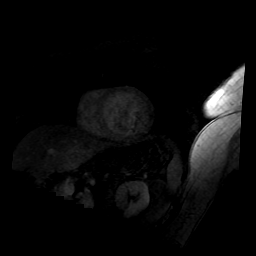
[im 60/60]
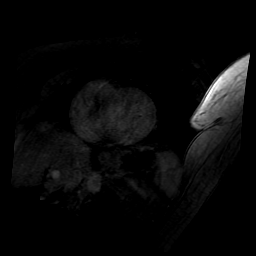

[Series 16: delayed ir prep · oblique · 8.0mm · 1.45mm/px · 1 of 12 slices shown]
[im 1/12]
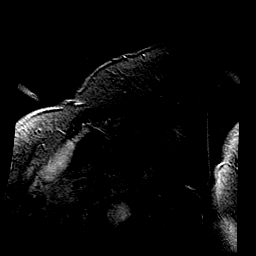

[Series 17: rad mde · axial · 8.0mm · 1.48mm/px · 1 of 3 slices shown]
[im 1/3]
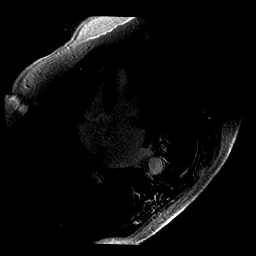

[36 of 40 positions shown; findings below may reference images not displayed]

FINDINGS: Limited images of the lung fields showed no gross abnormalities.

Normal left ventricular size with normal wall thickness. EF 27% with
diffuse hypokinesis. Mildly dilated right ventricle with normal
systolic function. Severe left atrial enlargement. Mild right atrial
enlargement. Moderate mitral regurgitation. The aortic valve
appeared trileaflet without significant regurgitation or stenosis.

On delayed enhancement imaging, no myocardial late gadolinium
enhancement (LGE) was noted.

Measurements:

LVEDV 196 mL
LVSV 54 mL
LVEF 27%
IMPRESSION: 1.  Difficult study due to frequent PVCs.

2.  Normal LV size with EF 27%, diffuse hypokinesis.

3.  Mildly dilated RV with normal systolic function.

4.  Several left atrial enlargement.

5. No myocardial LGE, so no definitive evidence for prior MI,
infiltrative disease, or myocarditis. Suspect nonischemic
cardiomyopathy, possibly due to PVCs.

Ka Yiu Kamwing

## 2018-05-03 ENCOUNTER — Ambulatory Visit: Payer: Medicare Other | Admitting: Cardiology

## 2018-05-03 ENCOUNTER — Other Ambulatory Visit: Payer: Self-pay | Admitting: Family Medicine

## 2018-05-03 ENCOUNTER — Encounter: Payer: Self-pay | Admitting: Cardiology

## 2018-05-03 ENCOUNTER — Telehealth: Payer: Self-pay | Admitting: Family Medicine

## 2018-05-03 VITALS — BP 100/62 | HR 64 | Ht 62.0 in | Wt 174.4 lb

## 2018-05-03 DIAGNOSIS — I493 Ventricular premature depolarization: Secondary | ICD-10-CM | POA: Diagnosis not present

## 2018-05-03 DIAGNOSIS — E119 Type 2 diabetes mellitus without complications: Secondary | ICD-10-CM

## 2018-05-03 DIAGNOSIS — I420 Dilated cardiomyopathy: Secondary | ICD-10-CM | POA: Diagnosis not present

## 2018-05-03 DIAGNOSIS — I5042 Chronic combined systolic (congestive) and diastolic (congestive) heart failure: Secondary | ICD-10-CM | POA: Diagnosis not present

## 2018-05-03 DIAGNOSIS — Z780 Asymptomatic menopausal state: Secondary | ICD-10-CM

## 2018-05-03 NOTE — Telephone Encounter (Signed)
See below crm Need bone density order  Copied from CRM #[id number]. Topic: Appointment Scheduling - Scheduling Inquiry for Clinic >> May 02, 2018  3:29 PM Natasha Woodard wrote: Reason for CRM: Pt wants to schedule a bone density test

## 2018-05-03 NOTE — Patient Instructions (Signed)
MEDICATION INSTRUCTIONS  NO CHANGES      Your physician wants you to follow-up in 6 MONTH WITH DR HARDING.You will receive a reminder letter in the mail two months in advance. If you don't receive a letter, please call our office to schedule the follow-up appointment.   If you need a refill on your cardiac medications before your next appointment, please call your pharmacy.

## 2018-05-03 NOTE — Progress Notes (Signed)
PCP: [name], MD  Clinic Note: Chief Complaint  Patient presents with  . Follow-up    No new complaints  . Cardiomyopathy    NICM    HPI: Natasha Woodard is a 77 y.o. female with a PMH below who presents today for 6 month General Cardiology f/u for NICM with frequent PVCs. -- EF 20-30%. I last saw her on 11/30/17 -- she was doing well - we discussed the Echo results from Nov 2018.  She had started going to the CHF clinic & was tolerating medication adjustment - was doing vigorous water aerobics 3 d/wk.  Was in great spirits & feeling relatively well.  Natasha Woodard was last seen on March 12 2018 by Dr. Gala Romney --> still doing well.  No major complaints.  Watching her diet with eliminating salt.  Not doing well with fluid restriction, but not noted significant weight gains.  Stable weights at home (166-168 Lb). --Gradual etiology for her cardia myopathy was thought to be PVCs.  Started on amiodarone along with increased dose of Entresto.  Stable 25 twice daily of carvedilol and 12.5 mg of spironolactone.  Standing dose of Lasix with minimal PRN   Recent Hospitalizations: n/a  Studies Personally Reviewed - (if available, images/films reviewed: From Epic Chart or Care Everywhere)  CMRI: 01/17/18 --  1. Difficult study due to frequent PVCs. 2. Normal LV size with EF 27%, diffuse hypokinesis 3. Mildly dilated RV with normal systolic function .4. No myocardial LGE, so no definitive evidence for prior MI, infiltrative disease, or myocarditis. Suspect nonischemic cardiomyopathy, possibly due to PVCs.   Event monitor 01/20/18: Normal sinus rhythm with frequent multifocal PVCs (9%) and PACs. Nighttime bradycardia suggestive of OSA.   Interval History: Natasha Woodard returns here today overall feeling quite well.  She still doing 3 to 4 days a week water aerobics, in fact she just came from that today.  She seems to have been tolerating her increased dose of Entresto, and was recently started  on amiodarone for PVCs.  No adverse effects of nausea vomiting or decreased appetite. She really notes that her exertional dyspnea is still stable if not improved.  No resting dyspnea go daily orthopnea.  She does not feel irregular heartbeats or palpitations that much, and has not had any prolonged spells to suggest an arrhythmia.  No TIA or amaurosis fugax. She has not had any chest tightness or pressure with rest or exertion. Her edema is relatively controlled as well with no notable PND orthopnea -has not had to take any additional dose of Lasix.  No claudication. Lots of questions about on amiodarone being started anemia.  ROS: A comprehensive was performed. Review of Systems  Constitutional: Negative for malaise/fatigue.  HENT: Negative for nosebleeds.   Respiratory: Negative for shortness of breath.   Gastrointestinal: Negative for blood in stool and melena.  Genitourinary: Negative for hematuria.  Musculoskeletal: Positive for joint pain. Negative for back pain.  Neurological: Negative for focal weakness.  All other systems reviewed and are negative.   I have reviewed and (if needed) personally updated the patient's problem list, medications, allergies, past medical and surgical history, social and family history.   Past Medical History:  Diagnosis Date  . Calcific tendonitis 2008   Treatment of left leg  . Cardiomyopathy (HCC) 06/2017   Echo: EF 25 and 30%. GR 1 DD. Mild DD with elevated LVEDP. Mild valvular disease.; Cardiac MRI 01/1018: frequent PVCs (diffiuclt to interpret) - EF ~27% w/ diffuse  HK.  No evidence of infarct, infiltrative Dz or myocarditis. -- ? if related to PVCs  . Chronic combined systolic and diastolic CHF, NYHA class 2 and ACA/AHA stage C (HCC)   . Coronary artery disease, non-occlusive    mild-moderate CAD 06/30/17 cath  . CTS (carpal tunnel syndrome)   . Diabetes mellitus    Type II  . Dysrhythmia    patient said that she cant remember what it is  .  Hiatal hernia    with reflux  . Hyperlipidemia    Statin intolerant  . Hypertension   . Ichthyosis congenita   . NSVD (normal spontaneous vaginal delivery) 1971 & 1972  . Obesity   . Plantar fasciitis    Left  . Pulmonary nodule    Imaged multiple times and benign appearing  . Wears glasses     Past Surgical History:  Procedure Laterality Date  . ABDOMINAL HYSTERECTOMY  1975   TAH-BSO  . ABSCESS DRAINAGE  1972   right breast  . BREAST BIOPSY Left 01/14/2009   Stereo- Benign  . CARPAL TUNNEL RELEASE Right 07/02/2014   Procedure: RIGHT CARPAL TUNNEL RELEASE;  Surgeon: Nicki Reaper, MD;  Location: [facility];  Service: Orthopedics;  Laterality: Right;  . CARPAL TUNNEL RELEASE Left 06/11/2015   Procedure: LEFT CARPAL TUNNEL RELEASE;  Surgeon: Cindee Salt, MD;  Location: [facility];  Service: Orthopedics;  Laterality: Left;  REGIONAL/FAB  . COLONOSCOPY    . corn removal  1968   right  . Holter Monitor  06/2017   ~17,000 PVC beats - majority were singlets, some couplets. 44 brief 3-7 beat runs of NSVT. Also noted were less frequent PACs with 11 runs (longest 15 beats)  . OPEN REDUCTION INTERNAL FIXATION (ORIF) DISTAL RADIAL FRACTURE Left 07/21/2017   Procedure: OPEN REDUCTION INTERNAL FIXATION (ORIF) LEFT DISTAL RADIAL FRACTURE;  Surgeon: Sheral Apley, MD;  Location: [facility];  Service: Orthopedics;  Laterality: Left;  . RIGHT/LEFT HEART CATH AND CORONARY ANGIOGRAPHY N/A 06/30/2017   Procedure: Right/Left Heart Cath and Coronary Angiography;  Surgeon: Marykay Lex, MD;  Location: [facility] IN[facility]ASIVE [facility] LAB:  pRCA 55%, pCx 40%, OM1 45%, mCx 50%, D2 50% - LVEF 25-35%. Moderately elevated LVEDP(26 mmHg with PCWP 16 mmHg).  FICK CO/CI: 4.47/2.48. PA pressures 47/14 mmHg with a mean of 27 mm.  Marland Kitchen ROTATOR CUFF REPAIR     left shoulder  . TRANSTHORACIC ECHOCARDIOGRAM  06/2017   EF 25 and 30%. GR 1 DD. Mild diastolic dysfunction with elevated LVEDP. Mild valvular  disease.  . TRANSTHORACIC ECHOCARDIOGRAM  10/2017    EF remains 30-35%.  Diffuse hypokinesis noted.  Severe LA dilation    Current Meds  Medication Sig  . acetaminophen (TYLENOL) 325 MG tablet Take 650 mg by mouth every 6 (six) hours as needed.  Marland Kitchen amiodarone (PACERONE) 200 MG tablet Take 1 tablet (200 mg total) by mouth daily.  Marland Kitchen aspirin 81 MG tablet Take 1 tablet (81 mg total) by mouth daily.  . carvedilol (COREG) 25 MG tablet Take 1 tablet (25 mg total) by mouth 2 (two) times daily.  . Cholecalciferol (VITAMIN D) 1000 UNITS capsule Take 1,000 Units by mouth daily.   . furosemide (LASIX) 20 MG tablet Take daily if needed, if weight gain due to fluid  . glucose blood (ACCU-CHEK AVIVA PLUS) test strip USE TO TEST BLOOD SUGAR ONCE DAILY FOR DM E11.9  . Lancets (ACCU-CHEK MULTICLIX) lancets USE ONE LANCET TO TEST BLOOD  GLUCOSE DAILY FOR DM 250.00  . lovastatin (MEVACOR) 40 MG tablet Take 1 tablet (40 mg total) by mouth at bedtime.  . metFORMIN (GLUCOPHAGE) 500 MG tablet TAKE ONE-HALF TABLET BY MOUTH TWICE DAILY WITH MEALS  . sacubitril-valsartan (ENTRESTO) 97-103 MG Take 1 tablet by mouth 2 (two) times daily.  Marland Kitchen spironolactone (ALDACTONE) 25 MG tablet Take 0.5 tablets (12.5 mg total) by mouth daily.  . traMADol (ULTRAM) 50 MG tablet TAKE 1 TABLET BY MOUTH EVERY 12 HOURS AS NEEDED    Allergies  Allergen Reactions  . Ezetimibe-Simvastatin Other (See Comments)    REACTION: Muscle aches (side effect)  . Lipitor [Atorvastatin] Other (See Comments)    Leg weakness   . Tamiflu [Oseltamivir Phosphate] Other (See Comments)    nightmares  . Pravastatin Sodium Other (See Comments)    REACTION: Muscle aches (side effect)  . Sulfonamide Derivatives Nausea And Vomiting    Social History   Tobacco Use  . Smoking status: Former Smoker    Years: 28.00    Types: Cigarettes    Last attempt to quit: 12/12/1993    Years since quitting: 24.4  . Smokeless tobacco: Never Used  Substance Use Topics  .  Alcohol use: No    Alcohol/week: 0.0 oz  . Drug use: No   Social History   Social History Narrative   Two kids, two grandchildren.    Married 1969   Retired.     family history includes Cancer in her brother; Diabetes in her father and mother; Heart disease in her brother, father, and mother.  Wt Readings from Last 3 Encounters:  05/03/18 174 lb 6.4 oz (79.1 kg)  03/20/18 172 lb 12 oz (78.4 kg)  03/12/18 168 lb 1.9 oz (76.3 kg)    PHYSICAL EXAM BP 100/62   Pulse 64   Ht 5\' 2"  (1.575 m)   Wt 174 lb 6.4 oz (79.1 kg)   BMI 31.90 kg/m  Physical Exam  Constitutional: She is oriented to person, place, and time. She appears well-developed. No distress.  HENT:  Head: Normocephalic and atraumatic.  Neck: No JVD present.  Cardiovascular: Normal rate and regular rhythm.  Occasional extrasystoles are present. PMI is not displaced. Exam reveals gallop, S4 and decreased pulses (Minimally decreased bilateral pedal pulses). Exam reveals no friction rub.  No murmur heard. Pulmonary/Chest: Effort normal and breath sounds normal. No respiratory distress. She has no wheezes. She has no rales.  Abdominal: Soft. She exhibits no distension. There is no tenderness.  Musculoskeletal: Normal range of motion. She exhibits no edema (minimal).  Neurological: She is alert and oriented to person, place, and time.  Skin:  Chronic thick, scaly skin  Psychiatric: She has a normal mood and affect. Her behavior is normal. Judgment and thought content normal.  Poor grasp on her condition -- extensive explanations   Vitals reviewed.   Adult ECG Report n/a  Other studies Reviewed: Additional studies/ records that were reviewed today include:  Recent Labs:   Lab Results  Component Value Date   CHOL 139 03/15/2018   HDL 36.70 (L) 03/15/2018   LDLCALC 74 03/15/2018   LDLDIRECT 53 08/10/2009   TRIG 141.0 03/15/2018   CHOLHDL 4 03/15/2018   Lab Results  Component Value Date   CREATININE 1.19 (H)  02/19/2018   BUN 23 (H) 02/19/2018   NA 138 02/19/2018   K 5.0 02/19/2018   CL 103 02/19/2018   CO2 28 02/19/2018     ASSESSMENT / PLAN: Problem List Items  Addressed This Visit    Frequent PVCs (Chronic)    Patient, noted on exam, but not symptomatic. She is on stable dose of beta-blocker now started on amiodarone for suppression.      Dilated cardiomyopathy (HCC) - Primary (Chronic)    Cardiac MRI suggested no evidence of ischemia or infarction which goes along with normal coronaries.  Nothing to suggest myocarditis or infiltrative disease.  This would argue the etiology being PVCs.  Therefore start amiodarone. Otherwise on essentially maximal therapy: Next dose Entresto and carvedilol with low-dose spironolactone and minimal standing Lasix. Weights remain stable, has not had to use extra dose of Lasix since starting on Entresto.  Recommend switching  Her diabetic regimen to include Januvia.      Relevant Orders   EKG 12-Lead (Completed)   Diabetes mellitus without complication (HCC)    Remains on metformin, would consider Januvia.      Chronic combined systolic and diastolic heart failure (HCC) (Chronic)    Stable.  Is on medical management essentially.  Cannot really tolerate any further doses because of her current blood pressure.  But she seems to be doing quite well with her water aerobics etc. Prolonged explanation about pathophysiology. Looks the topic of possibly considering ICD.      Relevant Orders   EKG 12-Lead (Completed)     I spent a total of 50 minutes with the patient and chart review. >  50% of the time was spent in direct patient consultation.  Unlike last visit, she had multiple questions and seemed to understand the pathophysiology of her disease and the mechanism of action of her medicines.  Again - had to discuss PVCs & pathophysiology / relationship to her ICM -- as the reason for Amiodarone.  Continue f/u @ CHF clinic - however, she still wants to  come here to see me for reassurance..  Current medicines are reviewed at length with the patient today.  (+/- concerns) n/a The following changes have been made:  n/a  Patient Instructions  MEDICATION INSTRUCTIONS  NO CHANGES      Your physician wants you to follow-up in 6 MONTH WITH DR HARDING.You will receive a reminder letter in the mail two months in advance. If you don't receive a letter, please call our office to schedule the follow-up appointment.   If you need a refill on your cardiac medications before your next appointment, please call your pharmacy.      Studies Ordered:   Orders Placed This Encounter  Procedures  . EKG 12-Lead      Bryan Lemma, M.D., M.S. Interventional Cardiologist   Pager # [phone] Phone # [phone] [address] [place], [place] [address]   Thank you for choosing [facility]!!

## 2018-05-03 NOTE — Telephone Encounter (Signed)
Ordered. Thanks

## 2018-05-07 ENCOUNTER — Encounter: Payer: Self-pay | Admitting: Cardiology

## 2018-05-07 NOTE — Assessment & Plan Note (Signed)
Cardiac MRI suggested no evidence of ischemia or infarction which goes along with normal coronaries.  Nothing to suggest myocarditis or infiltrative disease.  This would argue the etiology being PVCs.  Therefore start amiodarone. Otherwise on essentially maximal therapy: Next dose Entresto and carvedilol with low-dose spironolactone and minimal standing Lasix. Weights remain stable, has not had to use extra dose of Lasix since starting on Entresto.  Recommend switching  Her diabetic regimen to include Januvia.

## 2018-05-07 NOTE — Assessment & Plan Note (Signed)
Stable.  Is on medical management essentially.  Cannot really tolerate any further doses because of her current blood pressure.  But she seems to be doing quite well with her water aerobics etc. Prolonged explanation about pathophysiology. Looks the topic of possibly considering ICD.

## 2018-05-07 NOTE — Assessment & Plan Note (Signed)
Patient, noted on exam, but not symptomatic. She is on stable dose of beta-blocker now started on amiodarone for suppression.

## 2018-05-07 NOTE — Assessment & Plan Note (Signed)
Remains on metformin, would consider Januvia.

## 2018-05-17 ENCOUNTER — Ambulatory Visit (HOSPITAL_COMMUNITY)
Admission: RE | Admit: 2018-05-17 | Discharge: 2018-05-17 | Disposition: A | Discharge status: Home / Self Care | Payer: Medicare Other | Source: Ambulatory Visit | Attending: Internal Medicine | Admitting: Internal Medicine

## 2018-05-17 ENCOUNTER — Ambulatory Visit (HOSPITAL_BASED_OUTPATIENT_CLINIC_OR_DEPARTMENT_OTHER)
Admission: RE | Admit: 2018-05-17 | Discharge: 2018-05-17 | Disposition: A | Discharge status: Home / Self Care | Payer: Medicare Other | Source: Ambulatory Visit | Attending: Internal Medicine | Admitting: Internal Medicine

## 2018-05-17 VITALS — BP 150/70 | HR 60 | Wt 173.8 lb

## 2018-05-17 DIAGNOSIS — G5600 Carpal tunnel syndrome, unspecified upper limb: Secondary | ICD-10-CM | POA: Diagnosis not present

## 2018-05-17 DIAGNOSIS — Z79899 Other long term (current) drug therapy: Secondary | ICD-10-CM | POA: Diagnosis not present

## 2018-05-17 DIAGNOSIS — E669 Obesity, unspecified: Secondary | ICD-10-CM | POA: Diagnosis not present

## 2018-05-17 DIAGNOSIS — I110 Hypertensive heart disease with heart failure: Secondary | ICD-10-CM | POA: Diagnosis not present

## 2018-05-17 DIAGNOSIS — Z7984 Long term (current) use of oral hypoglycemic drugs: Secondary | ICD-10-CM | POA: Insufficient documentation

## 2018-05-17 DIAGNOSIS — E119 Type 2 diabetes mellitus without complications: Secondary | ICD-10-CM | POA: Diagnosis not present

## 2018-05-17 DIAGNOSIS — I10 Essential (primary) hypertension: Secondary | ICD-10-CM

## 2018-05-17 DIAGNOSIS — I493 Ventricular premature depolarization: Secondary | ICD-10-CM

## 2018-05-17 DIAGNOSIS — Z7982 Long term (current) use of aspirin: Secondary | ICD-10-CM | POA: Diagnosis not present

## 2018-05-17 DIAGNOSIS — Z87891 Personal history of nicotine dependence: Secondary | ICD-10-CM | POA: Diagnosis not present

## 2018-05-17 DIAGNOSIS — I2510 Atherosclerotic heart disease of native coronary artery without angina pectoris: Secondary | ICD-10-CM | POA: Insufficient documentation

## 2018-05-17 DIAGNOSIS — I5042 Chronic combined systolic (congestive) and diastolic (congestive) heart failure: Secondary | ICD-10-CM | POA: Diagnosis not present

## 2018-05-17 DIAGNOSIS — I428 Other cardiomyopathies: Secondary | ICD-10-CM | POA: Insufficient documentation

## 2018-05-17 DIAGNOSIS — E785 Hyperlipidemia, unspecified: Secondary | ICD-10-CM | POA: Insufficient documentation

## 2018-05-17 DIAGNOSIS — I5022 Chronic systolic (congestive) heart failure: Secondary | ICD-10-CM | POA: Diagnosis not present

## 2018-05-17 LAB — BASIC METABOLIC PANEL
Anion gap: 7 (ref 5–15)
BUN: 20 mg/dL (ref 6–20)
CALCIUM: 10 mg/dL (ref 8.9–10.3)
CO2: 30 mmol/L (ref 22–32)
CREATININE: 1.47 mg/dL — AB (ref 0.44–1.00)
Chloride: 104 mmol/L (ref 101–111)
GFR calc Af Amer: 39 mL/min — ABNORMAL LOW (ref >60)
GFR calc non Af Amer: 33 mL/min — ABNORMAL LOW (ref >60)
GLUCOSE: 82 mg/dL (ref 65–99)
Potassium: 5.6 mmol/L — ABNORMAL HIGH (ref 3.5–5.1)
Sodium: 141 mmol/L (ref 135–145)

## 2018-05-17 NOTE — Progress Notes (Signed)
  Echocardiogram 2D Echocardiogram has been performed.  Natasha Woodard 05/17/2018, 1:44 PM

## 2018-05-17 NOTE — Patient Instructions (Signed)
Lab today  Your physician wants you to follow-up in: 4 months. You will receive a reminder letter in the mail two months in advance. If you don't receive a letter, please call our office to schedule the follow-up appointment.

## 2018-05-17 NOTE — Addendum Note (Signed)
Encounter addended by: Mancel Bale, CMA on: 05/17/2018 3:14 PM  Actions taken: Order list changed, Diagnosis association updated

## 2018-05-17 NOTE — Progress Notes (Signed)
ADVANCED HF CLINIC NOTE  Referring Physician: Herbie Woodard  Primary Care: Natasha Woodard Primary Cardiologist: Natasha Woodard  HPI:  Natasha Woodard is a 77 y.o. female with a h/o HL, HTN, DM2 and systolic HF due to NICM referred by Dr. Herbie Woodard for further evaluation of her HF.   Natasha Woodard was initially evaluated in the summer 2018 and was noted to have frequent PVCs on EKG with a low resting heart rate. Holter showed 13% PVCs. Echo showed an EF of 20-30% and a Holter monitor which revealed significant amount of ventricular ectopy. She was also referred for right and left heart catheterization back in July that showed minimal CAD with EF 25-30%, LVEDP 26 mmHg with PCWP of only 16 mHg).  She has been treated medically. F/u echo 10/26/2017  2 D Echo 10/26/2017: EF remains 30-35%.  Diffuse hypokinesis noted.  Severe LA dilation. RV normal  She returns today for regular follow up. She is still going to water aerobics 4 days/week. Feels much better. No CP or SOB. No edema, orthopnea or PND. EF ~45%   CMRI: 01/17/18 IMPRESSION: 1.  Difficult study due to frequent PVCs. 2.  Normal LV size with EF 27%, diffuse hypokinesis 3.  Mildly dilated RV with normal systolic function 4. No myocardial LGE, so no definitive evidence for prior MI, infiltrative disease, or myocarditis. Suspect nonischemic cardiomyopathy, possibly due to PVCs.  Event monitor 01/20/18 Normal sinus rhythm with frequent multifocal PVCs (9%) and PACs. Nighttime bradycardia suggestive of OSA.    Past Medical History:  Diagnosis Date  . Calcific tendonitis 2008   Treatment of left leg  . Cardiomyopathy (HCC) 06/2017   Echo: EF 25 and 30%. GR 1 DD. Mild DD with elevated LVEDP. Mild valvular disease.; Cardiac MRI 01/1018: frequent PVCs (diffiuclt to interpret) - EF ~27% w/ diffuse HK.  No evidence of infarct, infiltrative Dz or myocarditis. -- ? if related to PVCs  . Chronic combined systolic and diastolic CHF, NYHA class 2 and ACA/AHA stage C  (HCC)   . Coronary artery disease, non-occlusive    mild-moderate CAD 06/30/17 cath  . CTS (carpal tunnel syndrome)   . Diabetes mellitus    Type II  . Dysrhythmia    patient said that she cant remember what it is  . Hiatal hernia    with reflux  . Hyperlipidemia    Statin intolerant  . Hypertension   . Ichthyosis congenita   . NSVD (normal spontaneous vaginal delivery) 1971 & 1972  . Obesity   . Plantar fasciitis    Left  . Pulmonary nodule    Imaged multiple times and benign appearing  . Wears glasses     Current Outpatient Medications  Medication Sig Dispense Refill  . acetaminophen (TYLENOL) 325 MG tablet Take 650 mg by mouth every 6 (six) hours as needed.    Marland Kitchen amiodarone (PACERONE) 200 MG tablet Take 1 tablet (200 mg total) by mouth daily. 90 tablet 3  . aspirin 81 MG tablet Take 1 tablet (81 mg total) by mouth daily.    . carvedilol (COREG) 25 MG tablet Take 1 tablet (25 mg total) by mouth 2 (two) times daily. 60 tablet 5  . Cholecalciferol (VITAMIN D) 1000 UNITS capsule Take 1,000 Units by mouth daily.     . furosemide (LASIX) 20 MG tablet Take daily if needed, if weight gain due to fluid    . glucose blood (ACCU-CHEK AVIVA PLUS) test strip USE TO TEST BLOOD SUGAR ONCE DAILY FOR DM E11.9 100  each 1  . Lancets (ACCU-CHEK MULTICLIX) lancets USE ONE LANCET TO TEST BLOOD GLUCOSE DAILY FOR DM 250.00 102 each 3  . lovastatin (MEVACOR) 40 MG tablet Take 1 tablet (40 mg total) by mouth at bedtime. 90 tablet 3  . metFORMIN (GLUCOPHAGE) 500 MG tablet TAKE ONE-HALF TABLET BY MOUTH TWICE DAILY WITH MEALS 90 tablet 3  . sacubitril-valsartan (ENTRESTO) 97-103 MG Take 1 tablet by mouth 2 (two) times daily. 60 tablet 6  . spironolactone (ALDACTONE) 25 MG tablet Take 0.5 tablets (12.5 mg total) by mouth daily. 45 tablet 3  . traMADol (ULTRAM) 50 MG tablet TAKE 1 TABLET BY MOUTH EVERY 12 HOURS AS NEEDED 180 tablet 1   No current facility-administered medications for this encounter.      Allergies  Allergen Reactions  . Ezetimibe-Simvastatin Other (See Comments)    REACTION: Muscle aches (side effect)  . Lipitor [Atorvastatin] Other (See Comments)    Leg weakness   . Tamiflu [Oseltamivir Phosphate] Other (See Comments)    nightmares  . Pravastatin Sodium Other (See Comments)    REACTION: Muscle aches (side effect)  . Sulfonamide Derivatives Nausea And Vomiting      Social History   Socioeconomic History  . Marital status: Married    Spouse name: Not on file  . Number of children: 2  . Years of education: Not on file  . Highest education level: Not on file  Occupational History    Employer: RETIRED  Social Needs  . Financial resource strain: Not on file  . Food insecurity:    Worry: Not on file    Inability: Not on file  . Transportation needs:    Medical: Not on file    Non-medical: Not on file  Tobacco Use  . Smoking status: Former Smoker    Years: 28.00    Types: Cigarettes    Last attempt to quit: 12/12/1993    Years since quitting: 24.4  . Smokeless tobacco: Never Used  Substance and Sexual Activity  . Alcohol use: No    Alcohol/week: 0.0 oz  . Drug use: No  . Sexual activity: Never  Lifestyle  . Physical activity:    Days per week: Not on file    Minutes per session: Not on file  . Stress: Not on file  Relationships  . Social connections:    Talks on phone: Not on file    Gets together: Not on file    Attends religious service: Not on file    Active member of club or organization: Not on file    Attends meetings of clubs or organizations: Not on file    Relationship status: Not on file  . Intimate partner violence:    Fear of current or ex partner: Not on file    Emotionally abused: Not on file    Physically abused: Not on file    Forced sexual activity: Not on file  Other Topics Concern  . Not on file  Social History Narrative   Two kids, two grandchildren.    Married 1969   Retired.       Family History  Problem  Relation Age of Onset  . Heart disease Mother        s/p pacemaker  . Diabetes Mother   . Heart disease Father   . Diabetes Father   . Heart disease Brother        MI  . Cancer Brother        Lung  . Colon  cancer Neg Hx   . Breast cancer Neg Hx     Vitals:   05/17/18 1438  BP: (!) 150/70  Pulse: 60  SpO2: 100%  Weight: 173 lb 12.8 oz (78.8 kg)   Filed Weights   05/17/18 1438  Weight: 173 lb 12.8 oz (78.8 kg)    PHYSICAL EXAM: General:  Well appearing. No resp difficulty HEENT: normal Neck: supple. no JVD. Carotids 2+ bilat; no bruits. No lymphadenopathy or thryomegaly appreciated. Cor: PMI nondisplaced. Regular rate & rhythm. No rubs, gallops or murmurs. Lungs: clear Abdomen: soft, nontender, nondistended. No hepatosplenomegaly. No bruits or masses. Good bowel sounds. Extremities: no cyanosis, clubbing, rash, edema Neuro: alert & orientedx3, cranial nerves grossly intact. moves all 4 extremities w/o difficulty. Affect pleasant   ASSESSMENT & PLAN:  1. Chronic systolic HF - due to NICM. Cath 7/18 with minimal CAD. Initial EF 20-25%. Etiology ? Frequent PVCs - Echo 11/18 EF 30-35%. - cMRI EF 27% No LGE. 2/19 - Echo today EF ~45%  - Holter monitor 2/19 9% PVCs  (Previous holter monitor 7/18 with 13% PVCs) - Continue lasix prn - Continue entresto 97/103 - continue carvedilol 18.75 bid - continue spiro 12.5 daily. Potassium runs about 5.0 will not titrate. Check today  - Doing well NYHA I-II. Volume status looks good.  - She started mid Woodard.   2. Frequent PVCs - Initial monitor with 13% PVCs 7/18. ? Etiology of NICM - F/u holter monitor 2/19 9% PVCs  - As above, started amio 200 daily in 3/19 for trial of PVC suppression - Will continue amio at current dose for now. Reduce to 100 daily in next few months.   3. HTN - BP high here but well controlled at home. Continue current regimen  4. DM2 - consider Jardiance  Arvilla Meres, MD  2:55 PM

## 2018-05-17 NOTE — Addendum Note (Signed)
Encounter addended by: Mancel Bale, CMA on: 05/17/2018 3:15 PM  Actions taken: Sign clinical note

## 2018-05-21 ENCOUNTER — Telehealth (HOSPITAL_COMMUNITY): Payer: Self-pay | Admitting: *Deleted

## 2018-05-21 ENCOUNTER — Other Ambulatory Visit: Payer: Self-pay | Admitting: Family Medicine

## 2018-05-21 DIAGNOSIS — I5022 Chronic systolic (congestive) heart failure: Secondary | ICD-10-CM

## 2018-05-21 DIAGNOSIS — E2839 Other primary ovarian failure: Secondary | ICD-10-CM

## 2018-05-21 NOTE — Telephone Encounter (Signed)
-----   Message from Noralee Space, RN sent at 05/18/2018 11:17 AM EDT ----- Left message to call back

## 2018-05-22 ENCOUNTER — Ambulatory Visit (HOSPITAL_COMMUNITY)
Admission: RE | Admit: 2018-05-22 | Discharge: 2018-05-22 | Disposition: A | Discharge status: Home / Self Care | Payer: Medicare Other | Source: Ambulatory Visit | Attending: Cardiology | Admitting: Cardiology

## 2018-05-22 DIAGNOSIS — I5022 Chronic systolic (congestive) heart failure: Secondary | ICD-10-CM

## 2018-05-22 LAB — BASIC METABOLIC PANEL
ANION GAP: 9 (ref 5–15)
BUN: 30 mg/dL — ABNORMAL HIGH (ref 6–20)
CALCIUM: 9.5 mg/dL (ref 8.9–10.3)
CHLORIDE: 103 mmol/L (ref 101–111)
CO2: 27 mmol/L (ref 22–32)
Creatinine, Ser: 1.34 mg/dL — ABNORMAL HIGH (ref 0.44–1.00)
GFR calc non Af Amer: 37 mL/min — ABNORMAL LOW (ref >60)
GFR, EST AFRICAN AMERICAN: 43 mL/min — AB (ref >60)
GLUCOSE: 96 mg/dL (ref 65–99)
Potassium: 5 mmol/L (ref 3.5–5.1)
Sodium: 139 mmol/L (ref 135–145)

## 2018-05-28 ENCOUNTER — Telehealth (HOSPITAL_COMMUNITY): Payer: Self-pay | Admitting: *Deleted

## 2018-05-28 NOTE — Telephone Encounter (Signed)
Pt called to ask about recent labs and is concerned about kidney function, spent 20 min on phone w/pt.  Upon discussion she has been taking Lasix daily, advised she should decrease this to only as needed, if her wt is up 3 lbs overnight or 5 lbs in a week and not take on a daily basis as she is also on [place].  She is agreeable and very thankful for information provided.

## 2018-06-07 ENCOUNTER — Telehealth (HOSPITAL_COMMUNITY): Payer: Self-pay

## 2018-06-07 NOTE — Telephone Encounter (Signed)
Patient was approved through the [facility] for Entresto effective 03/08/2018 through 06/06/2019. Available amount is $1000 for Entresto.

## 2018-06-15 ENCOUNTER — Encounter: Payer: Self-pay | Admitting: Family Medicine

## 2018-06-15 ENCOUNTER — Ambulatory Visit (INDEPENDENT_AMBULATORY_CARE_PROVIDER_SITE_OTHER): Payer: Medicare Other | Admitting: Family Medicine

## 2018-06-15 DIAGNOSIS — R059 Cough, unspecified: Secondary | ICD-10-CM

## 2018-06-15 DIAGNOSIS — R05 Cough: Secondary | ICD-10-CM

## 2018-06-15 MED ORDER — AMOXICILLIN-POT CLAVULANATE 875-125 MG PO TABS: 1.0000 | ORAL_TABLET | Freq: Two times a day (BID) | ORAL | 0 refills | Status: DC

## 2018-06-15 MED ORDER — LIDOCAINE VISCOUS HCL 2 % MT SOLN: 5.0000 mL | OROMUCOSAL | 1 refills | Status: DC | PRN

## 2018-06-15 NOTE — Progress Notes (Signed)
She was taking tramadol BID for joint pain with relief.    Sx started about 1 week ago.  Initially with cough and cold sx.  Used otc cold meds.  In the next few days had more hoarse voice, then had ST/throat pain in the last 3 days.  No fevers.  Throat pain is worse now.  Some sputum with cough, thick and tan.  occ some wheeze.  No facial pain, no ear pain.    Still on baseline heart meds.  Sugar had been controlled.    She had been using an oral appliance at night to help control mouth breathing.  I asked her to bring it to an OV or update me about the name.    Meds, vitals, and allergies reviewed.   ROS: Per HPI unless specifically indicated in ROS section   GEN: nad, alert and oriented HEENT: mucous membranes moist, tm w/o erythema, nasal exam w/o erythema, clear discharge noted,  OP with cobblestoning NECK: supple w/o LA CV: rrr.   PULM: ctab, no inc wob EXT: no edema SKIN: no acute rash Hoarse voice.   Skin with chronic changes at baseline.

## 2018-06-15 NOTE — Patient Instructions (Addendum)
Rest and fluids.  Limit talking to rest your voice.  Use lidocaine if needed for throat pain.  Start augmentin.  Update [place] as needed.  Take care.  Glad to see you.

## 2018-06-17 DIAGNOSIS — R059 Cough, unspecified: Secondary | ICD-10-CM | POA: Insufficient documentation

## 2018-06-17 DIAGNOSIS — R05 Cough: Secondary | ICD-10-CM | POA: Insufficient documentation

## 2018-06-17 NOTE — Assessment & Plan Note (Signed)
Unclear if this is just from postnasal drip causing the cough or if she has bronchitis.  Her lungs are clear at time of exam and her sinuses are not tender.  However given her other comorbid conditions and the duration and the progression, would presume at this point that she has a bacterial source and start Augmentin. Rest and fluids.  Limit talking to rest voice.  Use lidocaine if needed for throat pain.  Update [place] as needed.  Routine cautions given.  She agrees.  Okay for outpatient follow-up.

## 2018-06-18 ENCOUNTER — Telehealth (HOSPITAL_COMMUNITY): Payer: Self-pay | Admitting: *Deleted

## 2018-06-18 NOTE — Telephone Encounter (Signed)
Patient called and left VM on triage line, didn't state what she was calling for.  I called her back but had to leave VM asking for her to return our call.

## 2018-06-21 ENCOUNTER — Ambulatory Visit: Payer: Medicare Other | Admitting: Family Medicine

## 2018-06-26 ENCOUNTER — Encounter: Payer: Self-pay | Admitting: Family Medicine

## 2018-06-27 ENCOUNTER — Telehealth: Payer: Self-pay | Admitting: Family Medicine

## 2018-06-27 NOTE — Telephone Encounter (Signed)
I spoke with pt; pt has pain under lt breast that radiates to back or lt side but not neck or arm when takes deep breath, yawns or after finishing water aerobics class for a day. Pt goes to water aerobics qod and pain last the day after class. Pt has SOB upon exertion like going up and down stairs but that is not new. Pt had same feeling last yr and saw pulmonologist but did not find out the cause; the pain just finally went away. No fever,cough, wheezing N&V, sweats or generalized CP. Pt states she feels fine unless takes deep breath, yawns or day after water aerobics. Dr Para March said to monitor and if condition worsens or pt has CP, more SOB upon exertion than usual, the pain radiates down arm or into neck to go to ED. Pt said she is not going to water aerobics until 07/04/18 and pt will call with update before restarting class. Pt appreciative to Dr Para March for his care and advice.

## 2018-06-27 NOTE — Telephone Encounter (Signed)
Please call patient.  See my chart message.  Please triage this.  It could be that she has a pulled muscle but I would appreciate her being triaged.  Thanks.

## 2018-06-29 NOTE — Telephone Encounter (Signed)
Noted. Thanks.

## 2018-07-04 ENCOUNTER — Other Ambulatory Visit: Payer: Medicare Other

## 2018-07-04 ENCOUNTER — Encounter: Payer: Self-pay | Admitting: Family Medicine

## 2018-07-17 ENCOUNTER — Ambulatory Visit: Payer: Medicare Other | Admitting: Family Medicine

## 2018-07-19 ENCOUNTER — Encounter: Payer: Self-pay | Admitting: Family Medicine

## 2018-07-19 ENCOUNTER — Ambulatory Visit (INDEPENDENT_AMBULATORY_CARE_PROVIDER_SITE_OTHER): Payer: Medicare Other | Admitting: Family Medicine

## 2018-07-19 VITALS — BP 126/68 | HR 62 | Temp 98.7°F | Ht 62.0 in | Wt 174.5 lb

## 2018-07-19 DIAGNOSIS — E119 Type 2 diabetes mellitus without complications: Secondary | ICD-10-CM | POA: Diagnosis not present

## 2018-07-19 DIAGNOSIS — I5042 Chronic combined systolic (congestive) and diastolic (congestive) heart failure: Secondary | ICD-10-CM | POA: Diagnosis not present

## 2018-07-19 LAB — POCT GLYCOSYLATED HEMOGLOBIN (HGB A1C): Hemoglobin A1C: 6.1 % — AB (ref 4.0–5.6)

## 2018-07-19 MED ORDER — METFORMIN HCL 500 MG PO TABS: ORAL_TABLET | ORAL | Status: DC

## 2018-07-19 NOTE — Patient Instructions (Addendum)
Call the eye clinic for a yearly visit if they don't call you first.   Cut back on metformin to 1/2 tab ONCE a day in the AM around breakfast.   Cut out the evening dose of metformin.   It is likely safer to run a slightly higher sugar on less medicine to lower the risk of a bad/low sugar.   Take care.  Glad to see you.  Recheck in about 3-4 months at a yearly visit/medicare wellness visit.  Update me as needed.

## 2018-07-19 NOTE — Progress Notes (Signed)
Diabetes:  Using medications without difficulties: yes Hypoglycemic episodes:no Hyperglycemic episodes:no Feet problems:no Blood Sugars averaging: ~100 usually eye exam within last year: due, d/w pt.  She can call for appointment.   A1c d/w pt.  D/w pt about possible metformin taper.    She is still some SOB with walking stairs.  This is stable per patient report.  She is still doing water aerobics and she feels good about that.  She had trouble with 1 lap on 0.73mile track prev.  She can do about 5 labs now, so based on that her exertional capacity has improved.  Her cough is resolved.    PMH and SH reviewed Meds, vitals, and allergies reviewed.   ROS: Per HPI unless specifically indicated in ROS section   GEN: nad, alert and oriented HEENT: mucous membranes moist NECK: supple w/o LA CV: rrr. PULM: ctab, no inc wob ABD: soft, +bs EXT: no edema SKIN: chronic skin changes noted.

## 2018-07-22 NOTE — Assessment & Plan Note (Signed)
She is still some SOB with walking stairs.  This is stable per patient report.  She is still doing water aerobics and she feels good about that.  She had trouble with 1 lap on 0.66mile track prev.  She can do about 5 labs now, so based on that her exertional capacity has improved.  Continue with work on diet and exercise, exerting as tolerated.  She agrees.  No change in meds.

## 2018-07-22 NOTE — Assessment & Plan Note (Signed)
A1c improved.  Goal to try to limit risk of hypoglycemia.  Rationale discussed with patient, in detail. Cut back on metformin to 1/2 tab ONCE a day in the AM around breakfast.   Cut out the evening dose of metformin.   Recheck in about 3-4 months at a yearly visit/medicare wellness visit.  >25 minutes spent in face to face time with patient, >50% spent in counselling or coordination of care.

## 2018-07-25 ENCOUNTER — Ambulatory Visit
Admission: RE | Admit: 2018-07-25 | Discharge: 2018-07-25 | Disposition: A | Discharge status: Home / Self Care | Payer: Medicare Other | Source: Ambulatory Visit | Attending: Family Medicine | Admitting: Family Medicine

## 2018-07-25 DIAGNOSIS — Z780 Asymptomatic menopausal state: Secondary | ICD-10-CM | POA: Diagnosis not present

## 2018-07-25 DIAGNOSIS — Z13820 Encounter for screening for osteoporosis: Secondary | ICD-10-CM | POA: Diagnosis not present

## 2018-07-25 DIAGNOSIS — E2839 Other primary ovarian failure: Secondary | ICD-10-CM

## 2018-08-04 ENCOUNTER — Other Ambulatory Visit (HOSPITAL_COMMUNITY): Payer: Self-pay | Admitting: Internal Medicine

## 2018-08-04 ENCOUNTER — Other Ambulatory Visit: Payer: Self-pay | Admitting: Family Medicine

## 2018-08-06 ENCOUNTER — Telehealth: Payer: Self-pay

## 2018-08-06 MED ORDER — FLUCONAZOLE 150 MG PO TABS: 150.0000 mg | ORAL_TABLET | Freq: Once | ORAL | 0 refills | Status: DC

## 2018-08-06 NOTE — Telephone Encounter (Signed)
Copied from CRM [id number]. Topic: General - Other >> Aug 06, 2018  3:56 PM Percival Spanish wrote:  Pt was taking a antibiotic and now has a yeast infection and is asking for a RX she tried something over the counter    Pharmacy [facility] [address]

## 2018-08-06 NOTE — Telephone Encounter (Signed)
Would try diflucan x1 and update [place] if not better. Hold lovastatin for 3 days when she uses diflucan.  Thanks. rx sent.

## 2018-08-06 NOTE — Telephone Encounter (Signed)
Electronic refill request. Tramadol Last office visit:   07/19/18 Last Filled:    180 tablet 1 02/11/2018  Please advise.

## 2018-08-06 NOTE — Telephone Encounter (Signed)
Patient advised.

## 2018-08-07 ENCOUNTER — Telehealth: Payer: Self-pay | Admitting: Family Medicine

## 2018-08-07 DIAGNOSIS — M2550 Pain in unspecified joint: Secondary | ICD-10-CM

## 2018-08-07 NOTE — Telephone Encounter (Signed)
Left detailed message on voicemail.  

## 2018-08-07 NOTE — Telephone Encounter (Signed)
On med list first time tramadol was prescribed was 08/11/2011 for hip pain.Please advise.

## 2018-08-07 NOTE — Telephone Encounter (Signed)
Please call in.  Thanks.   

## 2018-08-07 NOTE — Telephone Encounter (Signed)
Medication phoned to pharmacy.  

## 2018-08-07 NOTE — Telephone Encounter (Signed)
Spoke with Artelia Laroche about this issue; she requests that this be routed to office for provider review.

## 2018-08-07 NOTE — Telephone Encounter (Signed)
Copied from CRM [id number]. Topic: Quick Communication - See Telephone Encounter >> Aug 07, 2018 10:18 AM Tamela Oddi, NT wrote: CRM for notification. See Telephone encounter for: 08/07/18. Robert calling from [facility] states he has questions about the medicaiton that was sent over today . It is the traMADol (ULTRAM) 50 MG tablet  ( He needs the diagnoses for the reason that patient on this medication  ) [phone]

## 2018-08-07 NOTE — Telephone Encounter (Signed)
Joint pain. M25.50.  Thanks.

## 2018-08-13 ENCOUNTER — Encounter: Payer: Self-pay | Admitting: Family Medicine

## 2018-08-14 ENCOUNTER — Telehealth: Payer: Self-pay | Admitting: Family Medicine

## 2018-08-14 NOTE — Telephone Encounter (Addendum)
I spoke with pt; pt is not having CP, pt has SOB upon exertion, not new symptom. No H/A or dizziness. No abd pain. Pt having hot flashes which last 3 - 5 mins. Last hot flash was last week. Pt scheduled appt with Dr Para March on 08/16/18 at 3pm. FYI to Dr Para March.

## 2018-08-14 NOTE — Telephone Encounter (Signed)
mychart message below.  Please triage patient and schedule as needed.  Thanks.    =====================  ----- Message from [place] "Natasha Woodard" to [name], MD sent at 08/13/2018 7:10 PM -----   Dr. Para March  I have been experiencing hot flashes within the past month. I have not had them in many years. should I be concerned especially with my heart problem?

## 2018-08-15 NOTE — Telephone Encounter (Signed)
Noted. Thanks.

## 2018-08-16 ENCOUNTER — Ambulatory Visit: Payer: Medicare Other | Admitting: Family Medicine

## 2018-08-21 ENCOUNTER — Ambulatory Visit (INDEPENDENT_AMBULATORY_CARE_PROVIDER_SITE_OTHER): Payer: Medicare Other | Admitting: Family Medicine

## 2018-08-21 ENCOUNTER — Encounter: Payer: Self-pay | Admitting: Family Medicine

## 2018-08-21 VITALS — BP 146/62 | HR 62 | Temp 98.8°F | Ht 62.0 in | Wt 177.5 lb

## 2018-08-21 DIAGNOSIS — R0683 Snoring: Secondary | ICD-10-CM | POA: Diagnosis not present

## 2018-08-21 DIAGNOSIS — R232 Flushing: Secondary | ICD-10-CM | POA: Diagnosis not present

## 2018-08-21 NOTE — Progress Notes (Signed)
Episodic hot flashes.  No chest pain.  On amiodarone.  Lasting about 1 minute.  Going on for about 1 month, episodically.  ~3-5x/week.  She hasn't checked her sugar with events.  She has baseline SOB with exertion but not at rest and this is still improved from historical level when she didn't have CHF meds optimized.  Not SOB with the events.  No BLE edema.  No vomiting, no diarrhea.  Episodes are at rest, not exertional.    She had hot flashes with menopause but not in the years since.    Weight is steady.  Sugar was 100 this AM.  Usually ~100 in the AMs.  She doesn't have known hypoglycemia.  She feels well o/w, she doesn't feel unwell.    She is still doing water aerobics at baseline.    She was using a oral tape at night to decrease snoring and sleep better.  I did not realize until today that she was using tape in such a manner.  Discussed with patient about getting sleep study done.  She said she would call about that.  Meds, vitals, and allergies reviewed.   ROS: Per HPI unless specifically indicated in ROS section   GEN: nad, alert and oriented HEENT: mucous membranes moist NECK: supple w/o LA CV: rrr. PULM: ctab, no inc wob ABD: soft, +bs EXT: no edema SKIN: no acute rash but chronic changes noted.  At baseline.

## 2018-08-21 NOTE — Patient Instructions (Signed)
We'll contact you with your lab report. If/when you have an episode then check your sugar.  Please check to see if the sleep study can get covered.  Take care.  Glad to see you.

## 2018-08-22 ENCOUNTER — Encounter: Payer: Self-pay | Admitting: Family Medicine

## 2018-08-22 LAB — CBC WITH DIFFERENTIAL/PLATELET
BASOS PCT: 1 % (ref 0.0–3.0)
Basophils Absolute: 0 10*3/uL (ref 0.0–0.1)
Eosinophils Absolute: 0.2 10*3/uL (ref 0.0–0.7)
Eosinophils Relative: 4.1 % (ref 0.0–5.0)
HEMATOCRIT: 40.7 % (ref 36.0–46.0)
HEMOGLOBIN: 14.1 g/dL (ref 12.0–15.0)
LYMPHS PCT: 38.9 % (ref 12.0–46.0)
Lymphs Abs: 1.8 10*3/uL (ref 0.7–4.0)
MCHC: 34.6 g/dL (ref 30.0–36.0)
MCV: 90.7 fl (ref 78.0–100.0)
MONOS PCT: 10.4 % (ref 3.0–12.0)
Monocytes Absolute: 0.5 10*3/uL (ref 0.1–1.0)
NEUTROS ABS: 2.1 10*3/uL (ref 1.4–7.7)
Neutrophils Relative %: 45.6 % (ref 43.0–77.0)
PLATELETS: 387 10*3/uL (ref 150.0–400.0)
RBC: 4.49 Mil/uL (ref 3.87–5.11)
RDW: 14.3 % (ref 11.5–15.5)
WBC: 4.6 10*3/uL (ref 4.0–10.5)

## 2018-08-22 LAB — COMPREHENSIVE METABOLIC PANEL
ALT: 13 U/L (ref 0–35)
AST: 17 U/L (ref 0–37)
Albumin: 4 g/dL (ref 3.5–5.2)
Alkaline Phosphatase: 50 U/L (ref 39–117)
BUN: 21 mg/dL (ref 6–23)
CALCIUM: 9.8 mg/dL (ref 8.4–10.5)
CO2: 29 meq/L (ref 19–32)
Chloride: 105 mEq/L (ref 96–112)
Creatinine, Ser: 1.09 mg/dL (ref 0.40–1.20)
GFR: 62.44 mL/min (ref >60.00)
Glucose, Bld: 95 mg/dL (ref 70–99)
POTASSIUM: 4.5 meq/L (ref 3.5–5.1)
Sodium: 140 mEq/L (ref 135–145)
Total Bilirubin: 0.3 mg/dL (ref 0.2–1.2)
Total Protein: 6.8 g/dL (ref 6.0–8.3)

## 2018-08-22 LAB — TSH: TSH: 1.57 u[IU]/mL (ref 0.35–4.50)

## 2018-08-22 NOTE — Assessment & Plan Note (Signed)
Unclear source.  Unclear if this could be related to relative/transient hypoglycemia.  She can check her sugar during 1 of the events and let me know.  Reasonable to check routine labs today.  She feels well at baseline otherwise.  She is well-appearing.  She does not appear to have uncompensated CHF.  She is not having chest pain.  She still okay for outpatient follow-up.  See notes on labs, I will await update from patient about her sugar if she has another event.  She agrees with plan.

## 2018-08-22 NOTE — Assessment & Plan Note (Signed)
I did not realize until today that she was using tape in such a manner.  I would prefer her to get the sleep study done instead.  Discussed.  She said she would call about that.  I will defer to patient.  She agrees.

## 2018-08-23 ENCOUNTER — Encounter: Payer: Self-pay | Admitting: Internal Medicine

## 2018-08-23 ENCOUNTER — Ambulatory Visit (INDEPENDENT_AMBULATORY_CARE_PROVIDER_SITE_OTHER): Payer: Medicare Other | Admitting: Internal Medicine

## 2018-08-23 VITALS — BP 128/72 | HR 56 | Ht 61.0 in | Wt 178.0 lb

## 2018-08-23 DIAGNOSIS — Q809 Congenital ichthyosis, unspecified: Secondary | ICD-10-CM

## 2018-08-23 DIAGNOSIS — G4733 Obstructive sleep apnea (adult) (pediatric): Secondary | ICD-10-CM

## 2018-08-23 DIAGNOSIS — R0683 Snoring: Secondary | ICD-10-CM

## 2018-08-23 DIAGNOSIS — I5042 Chronic combined systolic (congestive) and diastolic (congestive) heart failure: Secondary | ICD-10-CM | POA: Diagnosis not present

## 2018-08-23 NOTE — Patient Instructions (Signed)
Order- unattended home sleep test    Dx OSA  Please call me a bout 2 weeks after your sleep test, for results and recommendations. If appropriate, we might be able to start treatment before I see you next.

## 2018-08-23 NOTE — Progress Notes (Signed)
08/23/2018-77 year old female former smoker for sleep evaluation. Sleep Consult: Self Referral: Pt notes snoring, has family hx of CPAP.  Medical problem list includes dilated cardiomyopathy, CHF/systolic and diastolic, DM 2, HBP, icthyosis She wants to find out if she has sleep apnea.  Several siblings use CPAP.  Family reports loud snoring.  For the last 6 weeks she has been putting a piece of paper tape across her mouth at bedtime and says she sleeps better like this with less waking. Feels rested in the daytime.  Denies taking sleep medicines.  Little caffeine.  No ENT surgery. Wants to wait on flu shot.  Denies lung disease and says her cardiac status is under control, working with cardiology. Epworth score 6  Prior to Admission medications   Medication Sig Start Date End Date Taking? Authorizing Provider  acetaminophen (TYLENOL) 325 MG tablet Take 650 mg by mouth every 6 (six) hours as needed.   Yes [provider]  amiodarone (PACERONE) 200 MG tablet Take 1 tablet (200 mg total) by mouth daily. 01/26/18  Yes Bensimhon, Bevelyn Buckles, MD  aspirin 81 MG tablet Take 1 tablet (81 mg total) by mouth daily. 09/14/17  Yes [name], MD  carvedilol (COREG) 25 MG tablet TAKE 1 TABLET BY MOUTH TWICE DAILY 08/07/18  Yes Bensimhon, Bevelyn Buckles, MD  Cholecalciferol (VITAMIN D) 1000 UNITS capsule Take 1,000 Units by mouth daily.    Yes [provider]  furosemide (LASIX) 20 MG tablet Take daily if needed, if weight gain due to fluid 03/20/18  Yes [name], MD  glucose blood (ACCU-CHEK AVIVA PLUS) test strip USE TO TEST BLOOD SUGAR ONCE DAILY FOR DM E11.9 09/05/17  Yes [name], MD  Lancets (ACCU-CHEK MULTICLIX) lancets USE ONE LANCET TO TEST BLOOD GLUCOSE DAILY FOR DM 250.00 08/19/14  Yes [name], MD  lidocaine (XYLOCAINE) 2 % solution Use as directed 5 mLs in the mouth or throat every 4 (four) hours as needed for mouth pain (gargle and swallow or spit out). 06/15/18  Yes  [name], MD  lovastatin (MEVACOR) 40 MG tablet Take 1 tablet (40 mg total) by mouth at bedtime. 11/30/17  Yes Marykay Lex, MD  metFORMIN (GLUCOPHAGE) 500 MG tablet TAKE ONE-HALF TABLET BY MOUTH DAILY WITH BREAKFAST 07/19/18  Yes [name], MD  sacubitril-valsartan (ENTRESTO) 97-103 MG Take 1 tablet by mouth 2 (two) times daily. 03/12/18  Yes Bensimhon, Bevelyn Buckles, MD  spironolactone (ALDACTONE) 25 MG tablet Take 0.5 tablets (12.5 mg total) by mouth daily. 01/26/18  Yes Bensimhon, Bevelyn Buckles, MD  traMADol (ULTRAM) 50 MG tablet TAKE 1 TABLET BY MOUTH EVERY 12 HOURS AS NEEDED 08/07/18  Yes [name], MD   Past Medical History:  Diagnosis Date  . Calcific tendonitis 2008   Treatment of left leg  . Cardiomyopathy (HCC) 06/2017   Echo: EF 25 and 30%. GR 1 DD. Mild DD with elevated LVEDP. Mild valvular disease.; Cardiac MRI 01/1018: frequent PVCs (diffiuclt to interpret) - EF ~27% w/ diffuse HK.  No evidence of infarct, infiltrative Dz or myocarditis. -- ? if related to PVCs  . Chronic combined systolic and diastolic CHF, NYHA class 2 and ACA/AHA stage C (HCC)   . Coronary artery disease, non-occlusive    mild-moderate CAD 06/30/17 cath  . CTS (carpal tunnel syndrome)   . Diabetes mellitus    Type II  . Dysrhythmia    patient said that she cant remember what it is  .  Hiatal hernia    with reflux  . Hyperlipidemia    Statin intolerant  . Hypertension   . Ichthyosis congenita   . NSVD (normal spontaneous vaginal delivery) 1971 & 1972  . Obesity   . Plantar fasciitis    Left  . Pulmonary nodule    Imaged multiple times and benign appearing  . Wears glasses    Past Surgical History:  Procedure Laterality Date  . ABDOMINAL HYSTERECTOMY  1975   TAH-BSO  . ABSCESS DRAINAGE  1972   right breast  . BREAST BIOPSY Left 01/14/2009   Stereo- Benign  . CARPAL TUNNEL RELEASE Right 07/02/2014   Procedure: RIGHT CARPAL TUNNEL RELEASE;  Surgeon: Nicki Reaper, MD;  Location: [facility];  Service: Orthopedics;  Laterality: Right;  . CARPAL TUNNEL RELEASE Left 06/11/2015   Procedure: LEFT CARPAL TUNNEL RELEASE;  Surgeon: Cindee Salt, MD;  [facility];  Service: Orthopedics;  Laterality: Left;  REGIONAL/FAB  . COLONOSCOPY    . corn removal  1968   right  . Holter Monitor  06/2017   ~17,000 PVC beats - majority were singlets, some couplets. 44 brief 3-7 beat runs of NSVT. Also noted were less frequent PACs with 11 runs (longest 15 beats)  . OPEN REDUCTION INTERNAL FIXATION (ORIF) DISTAL RADIAL FRACTURE Left 07/21/2017   Procedure: OPEN REDUCTION INTERNAL FIXATION (ORIF) LEFT DISTAL RADIAL FRACTURE;  Surgeon: Sheral Apley, MD;  Location: [facility];  Service: Orthopedics;  Laterality: Left;  . RIGHT/LEFT HEART CATH AND CORONARY ANGIOGRAPHY N/A 06/30/2017   Procedure: Right/Left Heart Cath and Coronary Angiography;  Surgeon: Marykay Lex, MD;  Location: [facility] INVASIVE CV LAB:  pRCA 55%, pCx 40%, OM1 45%, mCx 50%, D2 50% - LVEF 25-35%. Moderately elevated LVEDP(26 mmHg with PCWP 16 mmHg).  FICK CO/CI: 4.47/2.48. PA pressures 47/14 mmHg with a mean of 27 mm.  Marland Kitchen ROTATOR CUFF REPAIR     left shoulder  . TRANSTHORACIC ECHOCARDIOGRAM  06/2017   EF 25 and 30%. GR 1 DD. Mild diastolic dysfunction with elevated LVEDP. Mild valvular disease.  . TRANSTHORACIC ECHOCARDIOGRAM  10/2017    EF remains 30-35%.  Diffuse hypokinesis noted.  Severe LA dilation   Family History  Problem Relation Age of Onset  . Heart disease Mother        s/p pacemaker  . Diabetes Mother   . Heart disease Father   . Diabetes Father   . Heart disease Brother        MI  . Cancer Brother        Lung  . Colon cancer Neg Hx   . Breast cancer Neg Hx   .soc ROS-see HPI   + = positive Constitutional:    weight loss, night sweats, fevers, chills, fatigue, lassitude. HEENT:    headaches, difficulty swallowing, tooth/dental problems, sore throat,       sneezing, itching, ear  ache, nasal congestion, post nasal drip, snoring CV:    chest pain, orthopnea, PND, swelling in lower extremities, anasarca,                                  dizziness, +palpitations Resp:   +shortness of breath with exertion or at rest.                productive cough,   non-productive cough, coughing up of blood.  change in color of mucus.  wheezing.   Skin:    rash or lesions. GI:  No-   heartburn, indigestion, abdominal pain, nausea, vomiting, diarrhea,                 change in bowel habits, loss of appetite GU: dysuria, change in color of urine, no urgency or frequency.   flank pain. MS:   joint pain, stiffness, decreased range of motion, back pain. Neuro-     nothing unusual Psych:  change in mood or affect.  depression or anxiety.   memory loss.  OBJ- Physical Exam General- Alert, Oriented, Affect-appropriate, Distress- none acute Skin- + ichthyosis Lymphadenopathy- none Head- atraumatic            Eyes- Gross vision intact, PERRLA, conjunctivae and secretions clear            Ears-+ hard of hearing            Nose-+ turbinate edema, no-Septal dev, mucus, polyps, erosion, perforation             Throat- Mallampati IV , mucosa clear , drainage- none, tonsils- atrophic, + own teeth Neck- flexible , trachea midline, no stridor , thyroid nl, carotid no bruit Chest - symmetrical excursion , unlabored           Heart/CV- RRR , no murmur , no gallop  , no rub, nl s1 s2                           - JVD- none , edema- none, stasis changes- none, varices- none           Lung- clear to P&A, wheeze- none, cough- none , dullness-none, rub- none           Chest wall-  Abd-  Br/ Gen/ Rectal- Not done, not indicated Extrem- cyanosis- none, clubbing, none, atrophy- none, strength- nl Neuro- grossly intact to observation

## 2018-08-24 NOTE — Assessment & Plan Note (Signed)
Not in overt heart failure at this visit.  She follows with cardiology and says that she is under good control now.

## 2018-08-24 NOTE — Assessment & Plan Note (Signed)
Evident on exam.  She is controlling with topical therapy.

## 2018-08-24 NOTE — Assessment & Plan Note (Signed)
High probability for obstructive sleep apnea based on history and physical exam.  We discussed basic sleep hygiene, the physiology, medical concerns and treatment options were obstructive sleep apnea pointing out responsibility to be awake and alert while driving. Plan-schedule sleep study anticipating she might be a candidate for CPAP or an oral appliance

## 2018-08-26 ENCOUNTER — Other Ambulatory Visit: Payer: Self-pay | Admitting: Family Medicine

## 2018-08-26 DIAGNOSIS — R0683 Snoring: Secondary | ICD-10-CM

## 2018-08-28 ENCOUNTER — Encounter: Payer: Self-pay | Admitting: Family Medicine

## 2018-09-02 ENCOUNTER — Telehealth: Payer: Self-pay | Admitting: Family Medicine

## 2018-09-02 NOTE — Telephone Encounter (Signed)
I need your input.  The patient is having recurrent brief episodes of hot flashes and I am uncertain as to the cause.  She does not have chest pain or shortness of breath with the episodes.  They are happening multiple times per week.  She does not have hypoglycemia during the episodes.  Her TSH is normal.  I did not know if this could be a med effect.  I did not know if you had seen this previously with amiodarone or with her other medications.  I would greatly appreciate your input.  Thanks.

## 2018-09-03 NOTE — Telephone Encounter (Signed)
Hey. In looking at he meds, doubt it is med related. I guess it could be arrhythmia but unlikely. Can consider monitor. Other than that, I don't have too much to offer. My apologies for not being more helpful.

## 2018-09-05 NOTE — Telephone Encounter (Signed)
Please call patient.  I have been considering options about her hot flashes.  I still do not have a clear answer.  I talked with cardiology.  Dr. Gala Romney did not think this was likely to be med related.  It is unclear to me if she could have some changes in her heart rhythm that could be causing the symptoms.  This is not very likely but it is theoretically possible.  He mentioned sitting up an ambulatory monitor to make sure she was not having extra skipped beats that were causing her symptoms.  I think this is reasonable, not because it is very likely, but because it would be useful to exclude.  See if patient is still having symptoms.  If so, please have her call Dr. Prescott Gum cardiology clinic.  I appreciate the help of all involved. I routed this note back to Dr. Gala Romney as Lorain Childes. Thanks.

## 2018-09-05 NOTE — Telephone Encounter (Signed)
Left message on voicemail for patient to call back. 

## 2018-09-06 NOTE — Telephone Encounter (Signed)
Patient advised and patient will contact Cardiology to get monitor set up.

## 2018-10-02 ENCOUNTER — Encounter: Payer: Self-pay | Admitting: Family Medicine

## 2018-10-03 ENCOUNTER — Encounter: Payer: Self-pay | Admitting: Cardiology

## 2018-10-03 ENCOUNTER — Ambulatory Visit: Payer: Medicare Other | Admitting: Cardiology

## 2018-10-03 ENCOUNTER — Telehealth: Payer: Self-pay | Admitting: Family Medicine

## 2018-10-03 VITALS — BP 142/86 | HR 63 | Ht 61.0 in | Wt 177.2 lb

## 2018-10-03 DIAGNOSIS — I420 Dilated cardiomyopathy: Secondary | ICD-10-CM | POA: Diagnosis not present

## 2018-10-03 DIAGNOSIS — I493 Ventricular premature depolarization: Secondary | ICD-10-CM

## 2018-10-03 DIAGNOSIS — E782 Mixed hyperlipidemia: Secondary | ICD-10-CM | POA: Diagnosis not present

## 2018-10-03 DIAGNOSIS — E119 Type 2 diabetes mellitus without complications: Secondary | ICD-10-CM

## 2018-10-03 DIAGNOSIS — I5042 Chronic combined systolic (congestive) and diastolic (congestive) heart failure: Secondary | ICD-10-CM | POA: Diagnosis not present

## 2018-10-03 DIAGNOSIS — I10 Essential (primary) hypertension: Secondary | ICD-10-CM | POA: Diagnosis not present

## 2018-10-03 MED ORDER — AMIODARONE HCL 200 MG PO TABS: 100.0000 mg | ORAL_TABLET | Freq: Every day | ORAL | 3 refills | Status: DC

## 2018-10-03 MED ORDER — FUROSEMIDE 20 MG PO TABS: ORAL_TABLET | ORAL | 3 refills | Status: DC

## 2018-10-03 NOTE — Assessment & Plan Note (Signed)
Labs pretty well controlled based on April.  Continue current dose of lovastatin.

## 2018-10-03 NOTE — Assessment & Plan Note (Signed)
Pretty much nonexistent now on exam and not on EKG.  She is asymptomatic at this point.  On max dose carvedilol that did not seem to control them.  Well-controlled now with amiodarone. Per plan, we will reduce to 100 mg daily amiodarone maintenance dose.

## 2018-10-03 NOTE — Telephone Encounter (Signed)
I spoke with pt; rt big toe is hurting for 1 week; no known injury, no redness and no red streaks,slight swelling in rt big toe; pt has never had gout before. [facility] [place].

## 2018-10-03 NOTE — Telephone Encounter (Signed)
Please triage patient.  See below.  Try to see if it sounds like she is having a gout attack.  Let me know if she had any trauma.  Thanks.  ----- Message from [place] B "Natasha Woodard" to [name], MD sent at 10/02/2018 4:24 PM -----   for the past several days I have been bothered with soreness in my big right toe. It is not causing great pain but is very, very sore.  what do I need to do?

## 2018-10-03 NOTE — Telephone Encounter (Signed)
I have been considering this.  It doesn't sound typical for gout.  I would prefer to see her instead of blindly putting her on rx med.  Try taking tylenol and using an ice bag in the meantime.  Please see when she can get on the schedule to get seen.  Thanks.

## 2018-10-03 NOTE — Telephone Encounter (Signed)
Patient notified as instructed by telephone and verbalized understanding. Patient stated that she is having foot soreness that has been going on for about a week. Offered patient an appointment this week which she declined stating that she has to work. Scheduled appointment with Dr. Para March Monday October, 28. Advised patient to call back if her symptoms get worse before her appointment.

## 2018-10-03 NOTE — Assessment & Plan Note (Signed)
Just a little bit volume up today.  Difficult to assess on exam although her weight would suggest increased volume along with her dyspnea. Plan: Restart Lasix at 20 mg daily for 1 week and then reduce to 1 tab 3 days a week with additional doses PRN for weightbearing more than 3 pounds, worsening exertional dyspnea or edema.  Appears to be on max titrate dose of carvedilol and Entresto. (Need to confirm if she is truly on 25 mg twice daily of carvedilol) On low-dose spironolactone which could potentially be titrated up pending potassium levels.  I agree with Dr. Prescott Gum recommendation of considering Jardiance for her diabetes management as well as for cardiovascular benefit.

## 2018-10-03 NOTE — Patient Instructions (Signed)
Medication Instructions:  --- CHANGE IN FUROSEMIDE ( LASIX ) INSTRUCTIONS FOR 7 DAYS (STARTING Oct 04 2018 TO OCT 31,2019)TAKE 20 MG EVERYDAY ,THEN START TAKING FUROSEMIDE  on Monday- Wednesday -Friday (3 days a week) If weight goes up 3 lbs or more overnight take  20 mg furosemide that day,   ---Decrease Amiodarone to 100 mg ( 1/2 TABLET OF 200 MG ) DAILY. If you need a refill on your cardiac medications before your next appointment, please call your pharmacy.   Lab work: Not needed If you have labs (blood work) drawn today and your tests are completely normal, you will receive your results only by: Marland Kitchen MyChart Message (if you have MyChart) OR . A paper copy in the mail If you have any lab test that is abnormal or we need to change your treatment, we will call you to review the results.  Testing/Procedures: Not needed  Follow-Up: At [facility], you and your health needs are our priority.  As part of our continuing mission to provide you with exceptional heart care, we have created designated Provider Care Teams.  These Care Teams include your primary Cardiologist (physician) and Advanced Practice Providers (APPs -  Physician Assistants and Nurse Practitioners) who all work together to provide you with the care you need, when you need it. You will need a follow up appointment in 6 months.  Please call our office 2 months in advance to schedule this appointment.  You may see Dr Herbie Baltimore or one of the following Advanced Practice Providers on your designated Care Team:   Theodore Demark, PA-C . Joni Reining, DNP, ANP  Any Other Special Instructions Will Be Listed Below (If Applicable).

## 2018-10-03 NOTE — Progress Notes (Signed)
PCP: [name], MD  Clinic Note: Chief Complaint  Patient presents with  . Follow-up    Mildly increased swelling.  Has not been taking Lasix  . Cardiomyopathy    Less notable palpitations on amiodarone    HPI: Natasha Woodard is a 77 y.o. female with a PMH notable for nonischemic frequent PVCs with a EF of 20-30 %.  She now presents today for what amounts to be 5 -month follow-up.  She is also being followed by Dr. Gala Romney from heart failure clinic. As of roughly 1 year ago she was tolerating her CHF medications doing water aerobics 3 days a week.  Was in great spirits and feeling well.  Natasha Woodard was last seen by me on May 23.  She was doing fairly well.  Still doing 3 to 4 days of water aerobics week.  Tolerating the increased dose of Entresto.  Dr. Gala Romney had recently started to have good PVCs.  Was tolerating this well.  Edema well controlled with no PRN doses of Lasix required. --She intends saw Dr. Gala Romney shortly thereafter on June 6: Echo At that time showed EF 35-40 % (Dr. Gala Romney felt it was more like 45%).  NYHA class I-II symptoms. -On as needed Lasix.  On max dose Entresto with carvedilol 18.7 by twice daily and spironolactone 12.5 mg daily.  On amiodarone 100 mg daily.  Recommended Jardiance for diabetes.  Recent Hospitalizations: n/a  Studies Personally Reviewed - (if available, images/films reviewed: From Epic Chart or Care Everywhere)  2D Echo May 17, 2018: Improved EF 35-40%.  Diffuse HK.  GR 1 DD.  Moderate TR.  Mild RV dilation.   Interval History: Natasha Woodard returns here today stating that she is doing okay but is a little bit confused by her Lasix instructions.  Apparently she has not been taking Lasix since her last visit with Dr. Gala Romney.  She did not understand to take it as needed (or did not understand what that meant).  She says that she has probably gained a few pounds since that visit and has noted that she is been getting a little  bit more dyspnea on exertion and a little bit more orthopnea with some edema.  This is a little bit worse than it was when I last saw her.  She still has not had significant edema.  She does have a little of orthopnea now but is in the process of undergoing evaluation versus sleep apnea which may explain some of that.  She still is doing her water aerobics routinely but is noticing a little bit more dyspnea with that than she had been before.  No resting or exertional chest tightness or pressure.  She denies any rapid irregular heartbeats palpitations.  No sensation of PVCs.  She seems to be doing much better as far as the palpitations go since starting the amiodarone. She does get lightheaded and dizzy occasionally when she first stands up or when she bends over, but denies any syncope/near syncope. No TIA/amaurosis fugax symptoms. No melena, hematochezia, hematuria, or epstaxis. No claudication.  ROS: A comprehensive was performed. Review of Systems  Constitutional: Negative for chills, fever, malaise/fatigue and weight loss.  HENT: Negative for congestion and nosebleeds.   Respiratory: Positive for shortness of breath (See HPI).   Gastrointestinal: Negative for abdominal pain, blood in stool, heartburn and melena.  Genitourinary: Negative for hematuria.  Musculoskeletal: Positive for joint pain. Negative for falls and myalgias.  Neurological: Positive for dizziness (Per HPI).  Negative for focal weakness.  Endo/Heme/Allergies: Does not bruise/bleed easily.  Psychiatric/Behavioral: Negative for depression. The patient is not nervous/anxious.   All other systems reviewed and are negative.  I have reviewed and (if needed) personally updated the patient's problem list, medications, allergies, past medical and surgical history, social and family history.   Past Medical History:  Diagnosis Date  . Calcific tendonitis 2008   Treatment of left leg  . Cardiomyopathy (HCC) 06/2017   a) Echo: EF  25 and 30%. GR 1 DD. Mild DD with elevated LVEDP. Mild valvular disease.; b) Cardiac MRI 2/'19: frequent PVCs (diffiuclt to interpret) - EF ~27% w/ diffuse HK.  No evidence of infarct, infiltrative Dz or myocarditis. -- ? if related to PVCs.  Mild RV dilation with normal function.; c) f/u Echo 6/'19: EF 35-40%. Gr 1 DD. Diffuse HK. Mod TR. Mild RV dilation.  . Chronic combined systolic and diastolic CHF, NYHA class 2 and ACA/AHA stage C (HCC)   . Coronary artery disease, non-occlusive    mild-moderate CAD 06/30/17 cath  . CTS (carpal tunnel syndrome)   . Diabetes mellitus    Type II  . Dysrhythmia    patient said that she cant remember what it is  . 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  Nighttime bradycardia suggestive of OSA.  Marland Kitchen Hiatal hernia    with reflux  . Hyperlipidemia    Statin intolerant  . Hypertension   . Ichthyosis congenita   . NSVD (normal spontaneous vaginal delivery) 1971 & 1972  . Obesity   . Plantar fasciitis    Left  . Pulmonary nodule    Imaged multiple times and benign appearing  . Wears glasses     Past Surgical History:  Procedure Laterality Date  . ABDOMINAL HYSTERECTOMY  1975   TAH-BSO  . ABSCESS DRAINAGE  1972   right breast  . BREAST BIOPSY Left 01/14/2009   Stereo- Benign  . CARDIAC MRI  01/2018   Difficult to interpret 2/2 PVCs. Normal LV size - EF ~27% with diffuse HK. Mild RV dilation - normal fxn. -- NO MYOCARDIAL LGI => no definitive evidence of prior MI, Infiltrative Dz or Myocarditis -- suspect NICM, possibly related to PVCs.  . CARPAL TUNNEL RELEASE Right 07/02/2014   Procedure: RIGHT CARPAL TUNNEL RELEASE;  Surgeon: Nicki Reaper, MD;  Location: [facility];  Service: Orthopedics;  Laterality: Right;  . CARPAL TUNNEL RELEASE Left 06/11/2015   Procedure: LEFT CARPAL TUNNEL RELEASE;  Surgeon: Cindee Salt, MD;  Location: [facility];   Service: Orthopedics;  Laterality: Left;  REGIONAL/FAB  . COLONOSCOPY    . corn removal  1968   right  . Holter Monitor  06/2017   ~17,000 PVC beats - majority were singlets, some couplets. 44 brief 3-7 beat runs of NSVT. Also noted were less frequent PACs with 11 runs (longest 15 beats)  . OPEN REDUCTION INTERNAL FIXATION (ORIF) DISTAL RADIAL FRACTURE Left 07/21/2017   Procedure: OPEN REDUCTION INTERNAL FIXATION (ORIF) LEFT DISTAL RADIAL FRACTURE;  Surgeon: Sheral Apley, MD;  Location: [facility];  Service: Orthopedics;  Laterality: Left;  . RIGHT/LEFT HEART CATH AND CORONARY ANGIOGRAPHY N/A 06/30/2017   Procedure: Right/Left Heart Cath and Coronary Angiography;  Surgeon: Marykay Lex, MD;  Location: [facility] INVASIVE CV LAB:  pRCA 55%, pCx 40%, OM1 45%, mCx 50%, D2 50% - LVEF 25-35%. Moderately elevated LVEDP(26 mmHg with PCWP  16 mmHg).  FICK CO/CI: 4.47/2.48. PA pressures 47/14 mmHg with a mean of 27 mm.  Marland Kitchen ROTATOR CUFF REPAIR     left shoulder  . TRANSTHORACIC ECHOCARDIOGRAM  06/2017   EF 25 and 30%. GR 1 DD. Mild diastolic dysfunction with elevated LVEDP. Mild valvular disease.  . TRANSTHORACIC ECHOCARDIOGRAM  11'18, 6/'19    a) EF remains 30-35%.  Diffuse hypokinesis noted.  Severe LA dilation; b) Improved EF 35-40%.  Diffuse HK.  GR 1 DD.  Moderate TR.  Mild RV dilation.    Current Meds  Medication Sig  . acetaminophen (TYLENOL) 325 MG tablet Take 650 mg by mouth every 6 (six) hours as needed.  Marland Kitchen aspirin 81 MG tablet Take 1 tablet (81 mg total) by mouth daily.  . carvedilol (COREG) 25 MG tablet TAKE 1 TABLET BY MOUTH TWICE DAILY  . Cholecalciferol (VITAMIN D) 1000 UNITS capsule Take 1,000 Units by mouth daily.   Marland Kitchen glucose blood (ACCU-CHEK AVIVA PLUS) test strip USE TO TEST BLOOD SUGAR ONCE DAILY FOR DM E11.9  . Lancets (ACCU-CHEK MULTICLIX) lancets USE ONE LANCET TO TEST BLOOD GLUCOSE DAILY FOR DM 250.00  . lovastatin (MEVACOR) 40 MG tablet Take 1 tablet (40 mg total) by mouth at  bedtime.  . metFORMIN (GLUCOPHAGE) 500 MG tablet TAKE ONE-HALF TABLET BY MOUTH DAILY WITH BREAKFAST  . sacubitril-valsartan (ENTRESTO) 97-103 MG Take 1 tablet by mouth 2 (two) times daily.  Marland Kitchen spironolactone (ALDACTONE) 25 MG tablet Take 0.5 tablets (12.5 mg total) by mouth daily.  . traMADol (ULTRAM) 50 MG tablet TAKE 1 TABLET BY MOUTH EVERY 12 HOURS AS NEEDED  . [DISCONTINUED] amiodarone (PACERONE) 200 MG tablet Take 1 tablet (200 mg total) by mouth daily.  . [DISCONTINUED] furosemide (LASIX) 20 MG tablet Take daily if needed, if weight gain due to fluid    Allergies  Allergen Reactions  . Ezetimibe-Simvastatin Other (See Comments)    REACTION: Muscle aches (side effect)  . Lipitor [Atorvastatin] Other (See Comments)    Leg weakness   . Tamiflu [Oseltamivir Phosphate] Other (See Comments)    nightmares  . Pravastatin Sodium Other (See Comments)    REACTION: Muscle aches (side effect)  . Sulfonamide Derivatives Nausea And Vomiting    Social History   Tobacco Use  . Smoking status: Former Smoker    Years: 28.00    Types: Cigarettes    Last attempt to quit: 12/12/1993    Years since quitting: 24.8  . Smokeless tobacco: Never Used  Substance Use Topics  . Alcohol use: No    Alcohol/week: 0.0 standard drinks  . Drug use: No   Social History   Social History Narrative   Two kids, two grandchildren.    Married 1969   Retired.     family history includes Cancer in her brother; Diabetes in her father and mother; Heart disease in her brother, father, and mother.  Wt Readings from Last 3 Encounters:  10/03/18 177 lb 3.2 oz (80.4 kg)  08/23/18 178 lb (80.7 kg)  08/21/18 177 lb 8 oz (80.5 kg)    PHYSICAL EXAM BP (!) 142/86   Pulse 63   Ht 5\' 1"  (1.549 m)   Wt 177 lb 3.2 oz (80.4 kg)   SpO2 99%   BMI 33.48 kg/m  Physical Exam  Constitutional: She is oriented to person, place, and time. She appears well-developed and well-nourished. No distress.  Well-groomed.   Healthy-appearing.  HENT:  Head: Normocephalic and atraumatic.  Mouth/Throat: Oropharynx is clear and  moist.  Neck: Normal range of motion. Neck supple. No hepatojugular reflux and no JVD (Somewhat hard to assess.  Roughly 8 cm H2O) present. Carotid bruit is not present.  Cardiovascular: Normal rate, regular rhythm, S1 normal and S2 normal.  Extrasystoles (Minimal) are present. PMI is not displaced (Unable to palpate). Exam reveals gallop, S4, distant heart sounds and decreased pulses (Mildly diminished pedal pulses.). Exam reveals no friction rub.  No murmur heard. Pulmonary/Chest: Effort normal and breath sounds normal. No respiratory distress. She has no wheezes. She has no rales.  Abdominal: Soft. Bowel sounds are normal. She exhibits no distension. There is no tenderness. There is no rebound.  Unable to really assess HSM  Musculoskeletal: Normal range of motion. She exhibits edema (Trivial to 1+ bilateral LE).  Neurological: She is alert and oriented to person, place, and time.  Skin: Skin is warm and dry.  Chronic thick, scaly skin.  Psychiatric: She has a normal mood and affect. Her behavior is normal. Judgment and thought content normal.  Still has a hard time grasping her condition, and even harder time understanding the reasoning behind her medications. --As result, the visit takes an additional 10 to 15 minutes simply for discussion  Vitals reviewed.     Adult ECG Report Not checked  Other studies Reviewed: Additional studies/ records that were reviewed today include:  Recent Labs:   Lab Results  Component Value Date   CHOL 139 03/15/2018   HDL 36.70 (L) 03/15/2018   LDLCALC 74 03/15/2018   LDLDIRECT 53 08/10/2009   TRIG 141.0 03/15/2018   CHOLHDL 4 03/15/2018   Lab Results  Component Value Date   CREATININE 1.09 08/21/2018   BUN 21 08/21/2018   NA 140 08/21/2018   K 4.5 08/21/2018   CL 105 08/21/2018   CO2 29 08/21/2018   Lab Results  Component Value Date    HGBA1C 6.1 (A) 07/19/2018    ASSESSMENT / PLAN: Problem List Items Addressed This Visit    Chronic combined systolic and diastolic heart failure (HCC) - Primary (Chronic)    Just a little bit volume up today.  Difficult to assess on exam although her weight would suggest increased volume along with her dyspnea. Plan: Restart Lasix at 20 mg daily for 1 week and then reduce to 1 tab 3 days a week with additional doses PRN for weightbearing more than 3 pounds, worsening exertional dyspnea or edema.  Appears to be on max titrate dose of carvedilol and Entresto. (Need to confirm if she is truly on 25 mg twice daily of carvedilol) On low-dose spironolactone which could potentially be titrated up pending potassium levels.  I agree with Dr. Prescott Gum recommendation of considering Jardiance for her diabetes management as well as for cardiovascular benefit.      Relevant Medications   amiodarone (PACERONE) 200 MG tablet   furosemide (LASIX) 20 MG tablet   Other Relevant Orders   EKG 12-Lead   Diabetes mellitus without complication (HCC) (Chronic)    A1c does appear to be well controlled.  With her having cardiomyopathy, would strongly consider addition of Jardiance for both diabetic and heart failure benefit.      Dilated cardiomyopathy (HCC) (Chronic)    Some improvement of EF by most recent echocardiogram.  Dr. Gala Romney consider the EF to be close to 45 % however the reader suggested it is more than 35 %.  States that there is close to 40.  She symptomatically does appear to have better EF at least  better cardiac output. Most likely etiology was frequent PVCs which was initially brought her to our attention. PVCs notably improved with amiodarone.  On max dose of Entresto with still some room for titration of carvedilol if not already 25 mg twice daily (currently listed is 25 twice daily, but according to Dr. Prescott Gum last note was 18.75 twice daily.) On low-dose spironolactone -- > could  potentially increase this for additional blood pressure control if potassium is stable.  She does seem like she is gained a little bit of weight not taking Lasix.  I will have her take Lasix every day for 5 days and then take standing 3 days a week with additional doses as needed.  -PRN will be for worsening edema, dyspnea or gain of more than 3 pounds in 24-hour timeframe.      Relevant Medications   amiodarone (PACERONE) 200 MG tablet   furosemide (LASIX) 20 MG tablet   Other Relevant Orders   EKG 12-Lead   Essential hypertension (Chronic)    Blood pressure seems a little bit higher than usual today.  She is already on essentially max dose carvedilol and Entresto.  We could potentially increase spironolactone to 25 mg daily.   Stating that these 3 medicines would be preferable.  Can reassess when seen by Dr. Gala Romney in heart failure clinic.      Relevant Medications   amiodarone (PACERONE) 200 MG tablet   furosemide (LASIX) 20 MG tablet   Frequent PVCs (Chronic)    Pretty much nonexistent now on exam and not on EKG.  She is asymptomatic at this point.  On max dose carvedilol that did not seem to control them.  Well-controlled now with amiodarone. Per plan, we will reduce to 100 mg daily amiodarone maintenance dose.      Relevant Medications   amiodarone (PACERONE) 200 MG tablet   furosemide (LASIX) 20 MG tablet   Other Relevant Orders   EKG 12-Lead   HLD (hyperlipidemia) (Chronic)    Labs pretty well controlled based on April.  Continue current dose of lovastatin.      Relevant Medications   amiodarone (PACERONE) 200 MG tablet   furosemide (LASIX) 20 MG tablet      I spent a total of 45 minutes with the patient and chart review. >  50% of the time was spent in direct patient consultation.  As noted above, explaining medication changes and options is quite an ordeal.  At least 3 different attempts are made to explain any change.  I also then had to go back over her  follow-up echocardiogram results and explained what that meant which also took additional 5 to 10 minutes.  Current medicines are reviewed at length with the patient today.  (+/- concerns) Not sure about how to take Lasix? The following changes have been made:  see below  Patient Instructions  Medication Instructions:  --- CHANGE IN FUROSEMIDE ( LASIX ) INSTRUCTIONS FOR 7 DAYS (STARTING Oct 04 2018 TO OCT 31,2019)TAKE 20 MG EVERYDAY ,THEN START TAKING FUROSEMIDE  on Monday- Wednesday -Friday (3 days a week) If weight goes up 3 lbs or more overnight take  20 mg furosemide that day,   ---Decrease Amiodarone to 100 mg ( 1/2 TABLET OF 200 MG ) DAILY. If you need a refill on your cardiac medications before your next appointment, please call your pharmacy.   Lab work: Not needed If you have labs (blood work) drawn today and your tests are completely normal, you will receive  your results only by: Marland Kitchen MyChart Message (if you have MyChart) OR . A paper copy in the mail If you have any lab test that is abnormal or we need to change your treatment, we will call you to review the results.  Testing/Procedures: Not needed  Follow-Up: At [facility], you and your health needs are our priority.  As part of our continuing mission to provide you with exceptional heart care, we have created designated Provider Care Teams.  These Care Teams include your primary Cardiologist (physician) and Advanced Practice Providers (APPs -  Physician Assistants and Nurse Practitioners) who all work together to provide you with the care you need, when you need it. You will need a follow up appointment in 6 months.  Please call our office 2 months in advance to schedule this appointment.  You may see Dr Herbie Baltimore or one of the following Advanced Practice Providers on your designated Care Team:   Theodore Demark, PA-C . Joni Reining, DNP, ANP  Any Other Special Instructions Will Be Listed Below (If  Applicable).     Studies Ordered:   Orders Placed This Encounter  Procedures  . EKG 12-Lead      Bryan Lemma, M.D., M.S. Interventional Cardiologist   Pager # [phone] Phone # [phone] [address] [place], [place] [address]   Thank you for choosing [facility] at [facility]!!

## 2018-10-03 NOTE — Assessment & Plan Note (Addendum)
Blood pressure seems a little bit higher than usual today.  She is already on essentially max dose carvedilol and Entresto.  We could potentially increase spironolactone to 25 mg daily.   Stating that these 3 medicines would be preferable.  Can reassess when seen by Dr. Gala Romney in heart failure clinic.

## 2018-10-03 NOTE — Assessment & Plan Note (Addendum)
Some improvement of EF by most recent echocardiogram.  Dr. Gala Romney consider the EF to be close to 45 % however the reader suggested it is more than 35 %.  States that there is close to 40.  She symptomatically does appear to have better EF at least better cardiac output. Most likely etiology was frequent PVCs which was initially brought her to our attention. PVCs notably improved with amiodarone.  On max dose of Entresto with still some room for titration of carvedilol if not already 25 mg twice daily (currently listed is 25 twice daily, but according to Dr. Prescott Gum last note was 18.75 twice daily.) On low-dose spironolactone -- > could potentially increase this for additional blood pressure control if potassium is stable.  She does seem like she is gained a little bit of weight not taking Lasix.  I will have her take Lasix every day for 5 days and then take standing 3 days a week with additional doses as needed.  -PRN will be for worsening edema, dyspnea or gain of more than 3 pounds in 24-hour timeframe.

## 2018-10-03 NOTE — Assessment & Plan Note (Signed)
A1c does appear to be well controlled.  With her having cardiomyopathy, would strongly consider addition of Jardiance for both diabetic and heart failure benefit.

## 2018-10-04 NOTE — Telephone Encounter (Signed)
Noted. Thanks.

## 2018-10-08 ENCOUNTER — Encounter (HOSPITAL_COMMUNITY): Payer: Self-pay | Admitting: Internal Medicine

## 2018-10-08 ENCOUNTER — Ambulatory Visit: Payer: Medicare Other | Admitting: Family Medicine

## 2018-10-08 ENCOUNTER — Ambulatory Visit (HOSPITAL_COMMUNITY)
Admission: RE | Admit: 2018-10-08 | Discharge: 2018-10-08 | Disposition: A | Discharge status: Home / Self Care | Payer: Medicare Other | Source: Ambulatory Visit | Attending: Internal Medicine | Admitting: Internal Medicine

## 2018-10-08 VITALS — BP 142/70 | HR 73 | Wt 182.0 lb

## 2018-10-08 DIAGNOSIS — Z882 Allergy status to sulfonamides status: Secondary | ICD-10-CM | POA: Insufficient documentation

## 2018-10-08 DIAGNOSIS — E11649 Type 2 diabetes mellitus with hypoglycemia without coma: Secondary | ICD-10-CM | POA: Diagnosis not present

## 2018-10-08 DIAGNOSIS — I10 Essential (primary) hypertension: Secondary | ICD-10-CM

## 2018-10-08 DIAGNOSIS — K219 Gastro-esophageal reflux disease without esophagitis: Secondary | ICD-10-CM | POA: Insufficient documentation

## 2018-10-08 DIAGNOSIS — Z79899 Other long term (current) drug therapy: Secondary | ICD-10-CM | POA: Insufficient documentation

## 2018-10-08 DIAGNOSIS — G5600 Carpal tunnel syndrome, unspecified upper limb: Secondary | ICD-10-CM | POA: Diagnosis not present

## 2018-10-08 DIAGNOSIS — I110 Hypertensive heart disease with heart failure: Secondary | ICD-10-CM | POA: Insufficient documentation

## 2018-10-08 DIAGNOSIS — E669 Obesity, unspecified: Secondary | ICD-10-CM | POA: Insufficient documentation

## 2018-10-08 DIAGNOSIS — Z7982 Long term (current) use of aspirin: Secondary | ICD-10-CM | POA: Insufficient documentation

## 2018-10-08 DIAGNOSIS — Z7984 Long term (current) use of oral hypoglycemic drugs: Secondary | ICD-10-CM | POA: Diagnosis not present

## 2018-10-08 DIAGNOSIS — E785 Hyperlipidemia, unspecified: Secondary | ICD-10-CM | POA: Insufficient documentation

## 2018-10-08 DIAGNOSIS — I493 Ventricular premature depolarization: Secondary | ICD-10-CM

## 2018-10-08 DIAGNOSIS — K449 Diaphragmatic hernia without obstruction or gangrene: Secondary | ICD-10-CM | POA: Diagnosis not present

## 2018-10-08 DIAGNOSIS — I428 Other cardiomyopathies: Secondary | ICD-10-CM | POA: Diagnosis not present

## 2018-10-08 DIAGNOSIS — Z87891 Personal history of nicotine dependence: Secondary | ICD-10-CM | POA: Insufficient documentation

## 2018-10-08 DIAGNOSIS — I5042 Chronic combined systolic (congestive) and diastolic (congestive) heart failure: Secondary | ICD-10-CM

## 2018-10-08 DIAGNOSIS — I2510 Atherosclerotic heart disease of native coronary artery without angina pectoris: Secondary | ICD-10-CM | POA: Diagnosis not present

## 2018-10-08 LAB — COMPREHENSIVE METABOLIC PANEL
ALBUMIN: 3.5 g/dL (ref 3.5–5.0)
ALT: 15 U/L (ref 0–44)
AST: 22 U/L (ref 15–41)
Alkaline Phosphatase: 48 U/L (ref 38–126)
Anion gap: 6 (ref 5–15)
BUN: 21 mg/dL (ref 8–23)
CALCIUM: 9.2 mg/dL (ref 8.9–10.3)
CHLORIDE: 109 mmol/L (ref 98–111)
CO2: 26 mmol/L (ref 22–32)
Creatinine, Ser: 1.28 mg/dL — ABNORMAL HIGH (ref 0.44–1.00)
GFR calc Af Amer: 45 mL/min — ABNORMAL LOW (ref >60)
GFR calc non Af Amer: 39 mL/min — ABNORMAL LOW (ref >60)
Glucose, Bld: 107 mg/dL — ABNORMAL HIGH (ref 70–99)
POTASSIUM: 4.8 mmol/L (ref 3.5–5.1)
SODIUM: 141 mmol/L (ref 135–145)
Total Bilirubin: 1 mg/dL (ref 0.3–1.2)
Total Protein: 6.5 g/dL (ref 6.5–8.1)

## 2018-10-08 LAB — TSH: TSH: 1.429 u[IU]/mL (ref 0.350–4.500)

## 2018-10-08 LAB — T4, FREE: Free T4: 1.28 ng/dL (ref 0.82–1.77)

## 2018-10-08 LAB — BRAIN NATRIURETIC PEPTIDE: B Natriuretic Peptide: 338.7 pg/mL — ABNORMAL HIGH (ref 0.0–100.0)

## 2018-10-08 NOTE — Patient Instructions (Addendum)
Labs done today  Your provider has recommended that  you wear a Zio Patch for 3 days.  This monitor will record your heart rhythm for our review.  IF you have any symptoms while wearing the monitor please press the button.  If you have any issues with the patch or you notice a red or orange light on it please call the company at [phone].  Once you remove the patch please mail it back to the company as soon as possible so we can get the results.  Your physician recommends that you schedule a follow-up appointment in: 2-3 months with echocardiogram

## 2018-10-08 NOTE — Addendum Note (Signed)
Encounter addended by: Dolores Patty, MD on: 10/08/2018 11:13 AM  Actions taken: Sign clinical note

## 2018-10-08 NOTE — Progress Notes (Addendum)
ADVANCED HF CLINIC NOTE  Referring Physician: Herbie Baltimore  Primary Care: Para March Primary Cardiologist: Herbie Baltimore  HPI:  Natasha Woodard is a 77 y.o. female with a h/o HL, HTN, DM2 and systolic HF due to NICM referred by Dr. Herbie Baltimore for further evaluation of her HF.   [name] was initially evaluated in the summer 2018 and was noted to have frequent PVCs on EKG with a low resting heart rate. Holter showed 13% PVCs. Echo showed an EF of 20-30% and a Holter monitor which revealed significant amount of ventricular ectopy. She was also referred for right and left heart catheterization back in July that showed minimal CAD with EF 25-30%, LVEDP 26 mmHg with PCWP of only 16 mHg).  She has been treated medically. F/u echo 10/26/2017  2 D Echo 10/26/2017: EF remains 30-35%.  Diffuse hypokinesis noted.  Severe LA dilation. RV normal  She returns today for regular follow up. Saw Dr. Dorethea Clan last week. Volume status was up in setting of not taking lasix. He had her take lasix 20 daily for 1 week and now down to MWF. He also decreased amio to 100 daily. She is still going to water aerobics 2 days/week and walking one day week. For 1 mile on TM. Feels ok. No CP or SOB. No edema, orthopnea or PND. Not watching diet. Has gained 2-3 pounds on home scale.   Echo 6/19 EF 35-40%  CMRI: 01/17/18 IMPRESSION: 1.  Difficult study due to frequent PVCs. 2.  Normal LV size with EF 27%, diffuse hypokinesis 3.  Mildly dilated RV with normal systolic function 4. No myocardial LGE, so no definitive evidence for prior MI, infiltrative disease, or myocarditis. Suspect nonischemic cardiomyopathy, possibly due to PVCs.  Event monitor 01/20/18 Normal sinus rhythm with frequent multifocal PVCs (9%) and PACs. Nighttime bradycardia suggestive of OSA.    Past Medical History:  Diagnosis Date  . Calcific tendonitis 2008   Treatment of left leg  . Cardiomyopathy (HCC) 06/2017   a) Echo: EF 25 and 30%. GR 1 DD. Mild DD with  elevated LVEDP. Mild valvular disease.; b) Cardiac MRI 2/'19: frequent PVCs (diffiuclt to interpret) - EF ~27% w/ diffuse HK.  No evidence of infarct, infiltrative Dz or myocarditis. -- ? if related to PVCs.  Mild RV dilation with normal function.; c) f/u Echo 6/'19: EF 35-40%. Gr 1 DD. Diffuse HK. Mod TR. Mild RV dilation.  . Chronic combined systolic and diastolic CHF, NYHA class 2 and ACA/AHA stage C (HCC)   . Coronary artery disease, non-occlusive    mild-moderate CAD 06/30/17 cath  . CTS (carpal tunnel syndrome)   . Diabetes mellitus    Type II  . Dysrhythmia    patient said that she cant remember what it is  . 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  Nighttime bradycardia suggestive of OSA.  Marland Kitchen Hiatal hernia    with reflux  . Hyperlipidemia    Statin intolerant  . Hypertension   . Ichthyosis congenita   . NSVD (normal spontaneous vaginal delivery) 1971 & 1972  . Obesity   . Plantar fasciitis    Left  . Pulmonary nodule    Imaged multiple times and benign appearing  . Wears glasses     Current Outpatient Medications  Medication Sig Dispense Refill  . acetaminophen (TYLENOL) 325 MG tablet Take 650 mg by mouth every 6 (six) hours as needed.    Marland Kitchen amiodarone (  PACERONE) 200 MG tablet Take 0.5 tablets (100 mg total) by mouth daily. 45 tablet 3  . aspirin 81 MG tablet Take 1 tablet (81 mg total) by mouth daily.    . carvedilol (COREG) 25 MG tablet TAKE 1 TABLET BY MOUTH TWICE DAILY 60 tablet 5  . Cholecalciferol (VITAMIN D) 1000 UNITS capsule Take 1,000 Units by mouth daily.     . furosemide (LASIX) 20 MG tablet Take 20 mg tablet every Monday, Wednesday, Friday, if weight is more than 3 lbs in a day take an extra 20 mg tablet that day as needed. 90 tablet 3  . glucose blood (ACCU-CHEK AVIVA PLUS) test strip USE TO TEST BLOOD SUGAR ONCE DAILY FOR DM E11.9 100 each 1  . Lancets (ACCU-CHEK MULTICLIX) lancets  USE ONE LANCET TO TEST BLOOD GLUCOSE DAILY FOR DM 250.00 102 each 3  . lovastatin (MEVACOR) 40 MG tablet Take 1 tablet (40 mg total) by mouth at bedtime. 90 tablet 3  . metFORMIN (GLUCOPHAGE) 500 MG tablet TAKE ONE-HALF TABLET BY MOUTH DAILY WITH BREAKFAST    . sacubitril-valsartan (ENTRESTO) 97-103 MG Take 1 tablet by mouth 2 (two) times daily. 60 tablet 6  . spironolactone (ALDACTONE) 25 MG tablet Take 0.5 tablets (12.5 mg total) by mouth daily. 45 tablet 3  . traMADol (ULTRAM) 50 MG tablet TAKE 1 TABLET BY MOUTH EVERY 12 HOURS AS NEEDED 180 tablet 1   No current facility-administered medications for this encounter.     Allergies  Allergen Reactions  . Ezetimibe-Simvastatin Other (See Comments)    REACTION: Muscle aches (side effect)  . Lipitor [Atorvastatin] Other (See Comments)    Leg weakness   . Tamiflu [Oseltamivir Phosphate] Other (See Comments)    nightmares  . Pravastatin Sodium Other (See Comments)    REACTION: Muscle aches (side effect)  . Sulfonamide Derivatives Nausea And Vomiting      Social History   Socioeconomic History  . Marital status: Married    Spouse name: Not on file  . Number of children: 2  . Years of education: Not on file  . Highest education level: Not on file  Occupational History    Employer: RETIRED  Social Needs  . Financial resource strain: Not on file  . Food insecurity:    Worry: Not on file    Inability: Not on file  . Transportation needs:    Medical: Not on file    Non-medical: Not on file  Tobacco Use  . Smoking status: Former Smoker    Years: 28.00    Types: Cigarettes    Last attempt to quit: 12/12/1993    Years since quitting: 24.8  . Smokeless tobacco: Never Used  Substance and Sexual Activity  . Alcohol use: No    Alcohol/week: 0.0 standard drinks  . Drug use: No  . Sexual activity: Never  Lifestyle  . Physical activity:    Days per week: Not on file    Minutes per session: Not on file  . Stress: Not on file    Relationships  . Social connections:    Talks on phone: Not on file    Gets together: Not on file    Attends religious service: Not on file    Active member of club or organization: Not on file    Attends meetings of clubs or organizations: Not on file    Relationship status: Not on file  . Intimate partner violence:    Fear of current or ex partner: Not on  file    Emotionally abused: Not on file    Physically abused: Not on file    Forced sexual activity: Not on file  Other Topics Concern  . Not on file  Social History Narrative   Two kids, two grandchildren.    Married 1969   Retired.       Family History  Problem Relation Age of Onset  . Heart disease Mother        s/p pacemaker  . Diabetes Mother   . Heart disease Father   . Diabetes Father   . Heart disease Brother        MI  . Cancer Brother        Lung  . Colon cancer Neg Hx   . Breast cancer Neg Hx     Vitals:   10/08/18 0945  BP: (!) 142/70  Pulse: 73  SpO2: 98%  Weight: 82.6 kg (182 lb)   Filed Weights   10/08/18 0945  Weight: 82.6 kg (182 lb)    PHYSICAL EXAM: General:  Elderly. Well appearing. No resp difficulty HEENT: normal Neck: supple. no JVD. Carotids 2+ bilat; no bruits. No lymphadenopathy or thryomegaly appreciated. Cor: PMI nondisplaced. Irregular rate & rhythm. No rubs, gallops or murmurs. Lungs: clear Abdomen: obese soft, nontender, nondistended. No hepatosplenomegaly. No bruits or masses. Good bowel sounds. Extremities: no cyanosis, clubbing, rash, edema Neuro: alert & orientedx3, cranial nerves grossly intact. moves all 4 extremities w/o difficulty. Affect pleasant   ASSESSMENT & PLAN:  1. Chronic systolic HF - due to NICM. Cath 7/18 with minimal CAD. Initial EF 20-25%. Etiology ? Frequent PVCs - Echo 11/18 EF 30-35%. - cMRI EF 27% No LGE. 2/19 - Echo 6/19 EF ~35-40%  - Improved NYHA II. Volume status ok back on lasix - Holter monitor 2/19 9% PVCs  (Previous holter monitor  7/18 with 13% PVCs) - Continue entresto 97/103 - Continue carvedilol 18.75 bid - Continue spiro 12.5 daily. Potassium runs about 5.0 will not titrate. Check today  - We discussed Bidil but she would like to hold off for now. Will repeat echo in 2 month if still < 40% wil start 0.5 tabs tid  2. Frequent PVCs - Initial monitor with 13% PVCs 7/18. ? Etiology of NICM - F/u holter monitor 2/19 9% PVCs  - As above, started amio 200 daily in 3/19 for trial of PVC suppression - Amio recently decreased to 100 daily. Seems to be having frequent PVCs on exam. Will get ECG. May need to repeat Holter - Has sleep study in November with Dr. Maple Hudson   3. HTN - BP mild high here but well controlled at home. Continue current regimen - As above. Consider Bidil.   4. DM2 - Metformin recently decreased with ? Hypoglycemic events  - Consider switch to Alben Deeds, MD  10:16 AM   Addendum:  ECG with NSR and frequent PVCs. Will place Zio Patch to requantify.   Arvilla Meres, MD  11:12 AM

## 2018-10-08 NOTE — Addendum Note (Signed)
Encounter addended by: Noralee Space, RN on: 10/08/2018 10:54 AM  Actions taken: Order list changed, Diagnosis association updated, Sign clinical note

## 2018-10-09 LAB — T3, FREE: T3, Free: 2.1 pg/mL (ref 2.0–4.4)

## 2018-10-11 ENCOUNTER — Ambulatory Visit (HOSPITAL_COMMUNITY)
Admission: RE | Admit: 2018-10-11 | Discharge: 2018-10-11 | Disposition: A | Discharge status: Home / Self Care | Payer: Medicare Other | Source: Ambulatory Visit | Attending: Internal Medicine | Admitting: Internal Medicine

## 2018-10-11 ENCOUNTER — Encounter (HOSPITAL_COMMUNITY): Payer: Medicare Other

## 2018-10-11 ENCOUNTER — Other Ambulatory Visit (HOSPITAL_COMMUNITY): Payer: Self-pay | Admitting: Internal Medicine

## 2018-10-11 DIAGNOSIS — I493 Ventricular premature depolarization: Secondary | ICD-10-CM

## 2018-10-12 ENCOUNTER — Other Ambulatory Visit: Payer: Self-pay

## 2018-10-12 HISTORY — PX: OTHER SURGICAL HISTORY: SHX169

## 2018-10-12 NOTE — Patient Outreach (Signed)
[facility] Customer service manager [facility]) Care Management  10/12/2018  JAQUELIN RZEPECKI 05/04/1941 [id number]   Medication Adherence call to Mrs. Mikenzi Shawver left a message for patient to call back patient is due on Lovastatin 40 mg. Mrs. Osterloh is showing past due under [facility] Ins.   Lillia Abed CPhT Pharmacy Technician [facility] Care Management Direct Dial [phone]  Fax [phone] [email or web address]

## 2018-10-17 DIAGNOSIS — E119 Type 2 diabetes mellitus without complications: Secondary | ICD-10-CM | POA: Diagnosis not present

## 2018-10-17 LAB — HM DIABETES EYE EXAM

## 2018-10-18 DIAGNOSIS — I493 Ventricular premature depolarization: Secondary | ICD-10-CM | POA: Diagnosis not present

## 2018-10-24 DIAGNOSIS — G4733 Obstructive sleep apnea (adult) (pediatric): Secondary | ICD-10-CM | POA: Diagnosis not present

## 2018-10-25 DIAGNOSIS — G4733 Obstructive sleep apnea (adult) (pediatric): Secondary | ICD-10-CM | POA: Diagnosis not present

## 2018-10-29 ENCOUNTER — Telehealth (HOSPITAL_COMMUNITY): Payer: Self-pay | Admitting: Pharmacist

## 2018-10-29 NOTE — Telephone Encounter (Signed)
Ms. Hettinger called asking if she could hold off on taking her am dosing of her BP medications since they make her feel fatigued during her water aerobics class. I told her she could hold off until after her class but to still take her am medications. She can also start taking her spironolactone in the evening. Her weights have been stable and fluctuate ~2 lbs randomly but if she takes additional furosemide, her weight is back to her dry weight.   Tyler Deis. Bonnye Fava, PharmD, BCPS, CPP Clinical Pharmacist Phone: [phone] 10/29/2018 4:17 PM

## 2018-10-30 ENCOUNTER — Other Ambulatory Visit: Payer: Self-pay | Admitting: *Deleted

## 2018-10-30 DIAGNOSIS — G4733 Obstructive sleep apnea (adult) (pediatric): Secondary | ICD-10-CM

## 2018-11-01 ENCOUNTER — Encounter: Payer: Self-pay | Admitting: Family Medicine

## 2018-11-05 ENCOUNTER — Other Ambulatory Visit: Payer: Self-pay | Admitting: Family Medicine

## 2018-11-05 ENCOUNTER — Ambulatory Visit: Payer: Medicare Other

## 2018-11-05 DIAGNOSIS — E119 Type 2 diabetes mellitus without complications: Secondary | ICD-10-CM

## 2018-11-06 ENCOUNTER — Ambulatory Visit (INDEPENDENT_AMBULATORY_CARE_PROVIDER_SITE_OTHER): Payer: Medicare Other

## 2018-11-06 VITALS — BP 118/74 | HR 63 | Temp 97.6°F | Ht 60.5 in | Wt 175.5 lb

## 2018-11-06 DIAGNOSIS — E119 Type 2 diabetes mellitus without complications: Secondary | ICD-10-CM

## 2018-11-06 DIAGNOSIS — Z0000 Encounter for general adult medical examination without abnormal findings: Secondary | ICD-10-CM

## 2018-11-06 LAB — LIPID PANEL
CHOLESTEROL: 141 mg/dL (ref 0–200)
HDL: 37.1 mg/dL — ABNORMAL LOW (ref >39.00)
LDL Cholesterol: 79 mg/dL (ref 0–99)
NonHDL: 104.38
TRIGLYCERIDES: 129 mg/dL (ref 0.0–149.0)
Total CHOL/HDL Ratio: 4
VLDL: 25.8 mg/dL (ref 0.0–40.0)

## 2018-11-06 LAB — BASIC METABOLIC PANEL
BUN: 20 mg/dL (ref 6–23)
CO2: 29 meq/L (ref 19–32)
Calcium: 9.7 mg/dL (ref 8.4–10.5)
Chloride: 103 mEq/L (ref 96–112)
Creatinine, Ser: 1.38 mg/dL — ABNORMAL HIGH (ref 0.40–1.20)
GFR: 47.53 mL/min — ABNORMAL LOW (ref >60.00)
GLUCOSE: 82 mg/dL (ref 70–99)
Potassium: 5.4 mEq/L — ABNORMAL HIGH (ref 3.5–5.1)
SODIUM: 139 meq/L (ref 135–145)

## 2018-11-06 LAB — HEMOGLOBIN A1C: HEMOGLOBIN A1C: 6.7 % — AB (ref 4.6–6.5)

## 2018-11-06 NOTE — Progress Notes (Signed)
Subjective:   Natasha Woodard is a 77 y.o. female who presents for Medicare Annual (Subsequent) preventive examination.  Review of Systems:  N/A Cardiac Risk Factors include: advanced age (>75men, >33 women);obesity (BMI >30kg/m2);dyslipidemia;hypertension;diabetes mellitus     Objective:     Vitals: BP 118/74 (BP Location: Right Arm, Patient Position: Sitting, Cuff Size: Normal)   Pulse 63   Temp 97.6 F (36.4 C) (Oral)   Ht 5' 0.5" (1.537 m) Comment: no shoes  Wt 175 lb 8 oz (79.6 kg)   SpO2 91%   BMI 33.71 kg/m   Body mass index is 33.71 kg/m.  Advanced Directives 11/06/2018 10/16/2017 09/26/2017 07/20/2017 07/11/2017 09/15/2016 06/11/2015  Does Patient Have a Medical Advance Directive? Yes Yes Yes Yes Yes Yes No  Type of Estate agent of [place];Living will Healthcare Power of [place];Living will Healthcare Power of [employer] of [place];Living will Living will;Healthcare Power of [employer] Power of [place];Living will -  Does patient want to make changes to medical advance directive? - No - Patient declined No - Patient declined - - No - Patient declined -  Copy of Healthcare Power of Attorney in Chart? No - copy requested - No - copy requested No - copy requested - - -  Would patient like information on creating a medical advance directive? - - - - - - No - patient declined information    Tobacco Social History   Tobacco Use  Smoking Status Former Smoker  . Years: 28.00  . Types: Cigarettes  . Last attempt to quit: 12/12/1993  . Years since quitting: 24.9  Smokeless Tobacco Never Used     Counseling given: No   Clinical Intake:  Pre-visit preparation completed: Yes  Pain : No/denies pain Pain Score: 0-No pain     Nutritional Status: BMI > 30  Obese Nutritional Risks: None Diabetes: No  How often do you need to have someone help you when you read instructions, pamphlets, or other written materials from your  doctor or pharmacy?: 1 - Never What is the last grade level you completed in school?: Bachelor degree  Interpreter Needed?: No  Comments: pt lives with spouse Information entered by :: LPinson, LPN  Past Medical History:  Diagnosis Date  . Calcific tendonitis 2008   Treatment of left leg  . Cardiomyopathy (HCC) 06/2017   a) Echo: EF 25 and 30%. GR 1 DD. Mild DD with elevated LVEDP. Mild valvular disease.; b) Cardiac MRI 2/'19: frequent PVCs (diffiuclt to interpret) - EF ~27% w/ diffuse HK.  No evidence of infarct, infiltrative Dz or myocarditis. -- ? if related to PVCs.  Mild RV dilation with normal function.; c) f/u Echo 6/'19: EF 35-40%. Gr 1 DD. Diffuse HK. Mod TR. Mild RV dilation.  . Chronic combined systolic and diastolic CHF, NYHA class 2 and ACA/AHA stage C (HCC)   . Coronary artery disease, non-occlusive    mild-moderate CAD 06/30/17 cath  . CTS (carpal tunnel syndrome)   . Diabetes mellitus    Type II  . Dysrhythmia    patient said that she cant remember what it is  . 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  Nighttime bradycardia suggestive of OSA.  Marland Kitchen Hiatal hernia    with reflux  . Hyperlipidemia    Statin intolerant  . Hypertension   . Ichthyosis congenita   . NSVD (normal spontaneous vaginal delivery) 1971 & 1972  .  Obesity   . Plantar fasciitis    Left  . Pulmonary nodule    Imaged multiple times and benign appearing  . Wears glasses    Past Surgical History:  Procedure Laterality Date  . ABDOMINAL HYSTERECTOMY  1975   TAH-BSO  . ABSCESS DRAINAGE  1972   right breast  . BREAST BIOPSY Left 01/14/2009   Stereo- Benign  . CARDIAC MRI  01/2018   Difficult to interpret 2/2 PVCs. Normal LV size - EF ~27% with diffuse HK. Mild RV dilation - normal fxn. -- NO MYOCARDIAL LGI => no definitive evidence of prior MI, Infiltrative Dz or Myocarditis -- suspect NICM, possibly related to PVCs.    . CARPAL TUNNEL RELEASE Right 07/02/2014   Procedure: RIGHT CARPAL TUNNEL RELEASE;  Surgeon: Nicki Reaper, MD;  Location: [facility];  Service: Orthopedics;  Laterality: Right;  . CARPAL TUNNEL RELEASE Left 06/11/2015   Procedure: LEFT CARPAL TUNNEL RELEASE;  Surgeon: Cindee Salt, MD;  Location: [facility];  Service: Orthopedics;  Laterality: Left;  REGIONAL/FAB  . COLONOSCOPY    . corn removal  1968   right  . Holter Monitor  06/2017   ~17,000 PVC beats - majority were singlets, some couplets. 44 brief 3-7 beat runs of NSVT. Also noted were less frequent PACs with 11 runs (longest 15 beats)  . OPEN REDUCTION INTERNAL FIXATION (ORIF) DISTAL RADIAL FRACTURE Left 07/21/2017   Procedure: OPEN REDUCTION INTERNAL FIXATION (ORIF) LEFT DISTAL RADIAL FRACTURE;  Surgeon: Sheral Apley, MD;  Location: [facility];  Service: Orthopedics;  Laterality: Left;  . RIGHT/LEFT HEART CATH AND CORONARY ANGIOGRAPHY N/A 06/30/2017   Procedure: Right/Left Heart Cath and Coronary Angiography;  Surgeon: Marykay Lex, MD;  Location: [facility] INVASIVE CV LAB:  pRCA 55%, pCx 40%, OM1 45%, mCx 50%, D2 50% - LVEF 25-35%. Moderately elevated LVEDP(26 mmHg with PCWP 16 mmHg).  FICK CO/CI: 4.47/2.48. PA pressures 47/14 mmHg with a mean of 27 mm.  Marland Kitchen ROTATOR CUFF REPAIR     left shoulder  . TRANSTHORACIC ECHOCARDIOGRAM  06/2017   EF 25 and 30%. GR 1 DD. Mild diastolic dysfunction with elevated LVEDP. Mild valvular disease.  . TRANSTHORACIC ECHOCARDIOGRAM  11'18, 6/'19    a) EF remains 30-35%.  Diffuse hypokinesis noted.  Severe LA dilation; b) Improved EF 35-40%.  Diffuse HK.  GR 1 DD.  Moderate TR.  Mild RV dilation.   Family History  Problem Relation Age of Onset  . Heart disease Mother        s/p pacemaker  . Diabetes Mother   . Heart disease Father   . Diabetes Father   . Heart disease Brother        MI  . Cancer Brother        Lung  . Colon cancer Neg Hx   . Breast cancer Neg Hx    Social  History   Socioeconomic History  . Marital status: Married    Spouse name: Not on file  . Number of children: 2  . Years of education: Not on file  . Highest education level: Not on file  Occupational History    Employer: RETIRED  Social Needs  . Financial resource strain: Not on file  . Food insecurity:    Worry: Not on file    Inability: Not on file  . Transportation needs:    Medical: Not on file    Non-medical: Not on file  Tobacco Use  . Smoking status:  Former Smoker    Years: 28.00    Types: Cigarettes    Last attempt to quit: 12/12/1993    Years since quitting: 24.9  . Smokeless tobacco: Never Used  Substance and Sexual Activity  . Alcohol use: No    Alcohol/week: 0.0 standard drinks  . Drug use: No  . Sexual activity: Never  Lifestyle  . Physical activity:    Days per week: Not on file    Minutes per session: Not on file  . Stress: Not on file  Relationships  . Social connections:    Talks on phone: Not on file    Gets together: Not on file    Attends religious service: Not on file    Active member of club or organization: Not on file    Attends meetings of clubs or organizations: Not on file    Relationship status: Not on file  Other Topics Concern  . Not on file  Social History Narrative   Two kids, two grandchildren.    Married 1969   Retired.     Outpatient Encounter Medications as of 11/06/2018  Medication Sig  . acetaminophen (TYLENOL) 325 MG tablet Take 650 mg by mouth every 6 (six) hours as needed.  Marland Kitchen amiodarone (PACERONE) 200 MG tablet Take 0.5 tablets (100 mg total) by mouth daily.  Marland Kitchen aspirin 81 MG tablet Take 1 tablet (81 mg total) by mouth daily.  . carvedilol (COREG) 25 MG tablet TAKE 1 TABLET BY MOUTH TWICE DAILY  . Cholecalciferol (VITAMIN D) 1000 UNITS capsule Take 1,000 Units by mouth daily.   Marland Kitchen ENTRESTO 97-103 MG TAKE 1 TABLET BY MOUTH TWICE DAILY  . furosemide (LASIX) 20 MG tablet Take 20 mg tablet every Monday, Wednesday, Friday, if  weight is more than 3 lbs in a day take an extra 20 mg tablet that day as needed.  Marland Kitchen glucose blood (ACCU-CHEK AVIVA PLUS) test strip USE TO TEST BLOOD SUGAR ONCE DAILY FOR DM E11.9  . Lancets (ACCU-CHEK MULTICLIX) lancets USE ONE LANCET TO TEST BLOOD GLUCOSE DAILY FOR DM 250.00  . lovastatin (MEVACOR) 40 MG tablet Take 1 tablet (40 mg total) by mouth at bedtime.  . metFORMIN (GLUCOPHAGE) 500 MG tablet TAKE ONE-HALF TABLET BY MOUTH DAILY WITH BREAKFAST  . spironolactone (ALDACTONE) 25 MG tablet Take 0.5 tablets (12.5 mg total) by mouth daily.  . traMADol (ULTRAM) 50 MG tablet TAKE 1 TABLET BY MOUTH EVERY 12 HOURS AS NEEDED   No facility-administered encounter medications on file as of 11/06/2018.     Activities of Daily Living In your present state of health, do you have any difficulty performing the following activities: 11/06/2018  Hearing? N  Vision? N  Difficulty concentrating or making decisions? N  Walking or climbing stairs? Y  Dressing or bathing? N  Doing errands, shopping? N  Preparing Food and eating ? N  Using the Toilet? N  In the past six months, have you accidently leaked urine? Y  Do you have problems with loss of bowel control? N  Managing your Medications? N  Managing your Finances? N  Housekeeping or managing your Housekeeping? N  Some recent data might be hidden    Patient Care Team: [name], MD as PCP - General    Assessment:   This is a routine wellness examination for [place].   Hearing Screening   125Hz  250Hz  500Hz  1000Hz  2000Hz  3000Hz  4000Hz  6000Hz  8000Hz   Right ear:   40 40 40  40  Left ear:   40 40 40  40    Vision Screening Comments: Vision exam in Nov 2019 with Dr. Manning Charity   Exercise Activities and Dietary recommendations Current Exercise Habits: Structured exercise class, Type of exercise: Other - see comments(water aerobics), Time (Minutes): 60, Frequency (Times/Week): 3, Weekly Exercise (Minutes/Week): 180, Intensity: Moderate,  Exercise limited by: None identified  Goals    . Increase physical activity     Starting 11/06/2018, I will continue to exercise for at least 60 min 3 days per week.        Fall Risk Fall Risk  11/06/2018 11/06/2017 10/16/2017 09/26/2017 09/15/2016  Falls in the past year? 0 Yes Yes Yes No  Number falls in past yr: - 1 1 1  -  Injury with Fall? - Yes Yes Yes -  Risk Factor Category  - - High Fall Risk - -  Risk for fall due to : - - History of fall(s) - -  Follow up - - Falls prevention discussed Falls evaluation completed -   Depression Screen PHQ 2/9 Scores 11/06/2018 11/06/2017 10/16/2017 09/26/2017  PHQ - 2 Score 0 0 0 0     Cognitive Function MMSE - Mini Mental State Exam 11/06/2018 09/15/2016  Orientation to time 5 5  Orientation to Place 5 5  Registration 3 3  Attention/ Calculation 0 0  Recall 3 3  Language- name 2 objects 0 0  Language- repeat 1 1  Language- follow 3 step command 3 3  Language- read & follow direction 0 0  Write a sentence 0 0  Copy design 0 0  Total score 20 20       PLEASE NOTE: A Mini-Cog screen was completed. Maximum score is 20. A value of 0 denotes this part of [place] MMSE was not completed or the patient failed this part of the Mini-Cog screening.   Mini-Cog Screening Orientation to Time - Max 5 pts Orientation to Place - Max 5 pts Registration - Max 3 pts Recall - Max 3 pts Language Repeat - Max 1 pts Language Follow 3 Step Command - Max 3 pts   Immunization History  Administered Date(s) Administered  . Influenza Whole 08/12/2010  . Influenza,inj,Quad PF,6+ Mos 08/19/2014, 08/15/2015, 09/02/2016  . Influenza-Unspecified 09/11/2018  . Pneumococcal Conjugate-13 03/10/2015  . Pneumococcal Polysaccharide-23 05/26/2006  . Td 06/14/2007  . Zoster 08/10/2006    Screening Tests Health Maintenance  Topic Date Due  . FOOT EXAM  11/12/2018 (Originally 11/06/2018)  . TETANUS/TDAP  12/12/2019 (Originally 06/13/2017)  . HEMOGLOBIN A1C   05/07/2019  . OPHTHALMOLOGY EXAM  10/18/2019  . INFLUENZA VACCINE  Completed  . DEXA SCAN  Completed  . PNA vac Low Risk Adult  Completed      Plan:     I have personally reviewed, addressed, and noted the following in the patient's chart:  A. Medical and social history B. Use of alcohol, tobacco or illicit drugs  C. Current medications and supplements D. Functional ability and status E.  Nutritional status F.  Physical activity G. Advance directives H. List of other physicians I.  Hospitalizations, surgeries, and ER visits in previous 12 months J.  Vitals K. Screenings to include hearing, vision, cognitive, depression L. Referrals and appointments - none  In addition, I have reviewed and discussed with patient certain preventive protocols, quality metrics, and best practice recommendations. A written personalized care plan for preventive services as well as general preventive health recommendations were provided to patient.  See attached  scanned questionnaire for additional information.   Signed,   Lindell Noe, MHA, BS, LPN Health Coach

## 2018-11-06 NOTE — Progress Notes (Signed)
PCP notes:   Health maintenance:  Foot exam -PCP follow-up requested Tetanus vaccine - postponed/insurance A1C - completed  Abnormal screenings:   None  Patient concerns:   None  Nurse concerns:  None  Next PCP appt:   11/12/2018 @ 1045  I reviewed health advisor's note, was available for consultation on the day of service listed in this note, and agree with documentation and plan. Crawford Givens, MD.

## 2018-11-06 NOTE — Patient Instructions (Signed)
Ms. Borruso , Thank you for taking time to come for your Medicare Wellness Visit. I appreciate your ongoing commitment to your health goals. Please review the following plan we discussed and let me know if I can assist you in the future.   These are the goals we discussed: Goals    . Increase physical activity     Starting 11/06/2018, I will continue to exercise for at least 60 min 3 days per week.        This is a list of the screening recommended for you and due dates:  Health Maintenance  Topic Date Due  . Complete foot exam   11/12/2018*  . Tetanus Vaccine  12/12/2019*  . Hemoglobin A1C  05/07/2019  . Eye exam for diabetics  10/18/2019  . Flu Shot  Completed  . DEXA scan (bone density measurement)  Completed  . Pneumonia vaccines  Completed  *Topic was postponed. The date shown is not the original due date.   Preventive Care for Adults  A healthy lifestyle and preventive care can promote health and wellness. Preventive health guidelines for adults include the following key practices.  . A routine yearly physical is a good way to check with your health care provider about your health and preventive screening. It is a chance to share any concerns and updates on your health and to receive a thorough exam.  . Visit your dentist for a routine exam and preventive care every 6 months. Brush your teeth twice a day and floss once a day. Good oral hygiene prevents tooth decay and gum disease.  . The frequency of eye exams is based on your age, health, family medical history, use  of contact lenses, and other factors. Follow your health care provider's recommendations for frequency of eye exams.  . Eat a healthy diet. Foods like vegetables, fruits, whole grains, low-fat dairy products, and lean protein foods contain the nutrients you need without too many calories. Decrease your intake of foods high in solid fats, added sugars, and salt. Eat the right amount of calories for you. Get  information about a proper diet from your health care provider, if necessary.  . Regular physical exercise is one of the most important things you can do for your health. Most adults should get at least 150 minutes of moderate-intensity exercise (any activity that increases your heart rate and causes you to sweat) each week. In addition, most adults need muscle-strengthening exercises on 2 or more days a week.  Silver Sneakers may be a benefit available to you. To determine eligibility, you may visit the website: www.silversneakers.com or contact program at (959)768-7425 Mon-Fri between 8AM-8PM.   . Maintain a healthy weight. The body mass index (BMI) is a screening tool to identify possible weight problems. It provides an estimate of body fat based on height and weight. Your health care provider can find your BMI and can help you achieve or maintain a healthy weight.   For adults 20 years and older: ? A BMI below 18.5 is considered underweight. ? A BMI of 18.5 to 24.9 is normal. ? A BMI of 25 to 29.9 is considered overweight. ? A BMI of 30 and above is considered obese.   . Maintain normal blood lipids and cholesterol levels by exercising and minimizing your intake of saturated fat. Eat a balanced diet with plenty of fruit and vegetables. Blood tests for lipids and cholesterol should begin at age 71 and be repeated every 5 years. If your lipid  or cholesterol levels are high, you are over 50, or you are at high risk for heart disease, you may need your cholesterol levels checked more frequently. Ongoing high lipid and cholesterol levels should be treated with medicines if diet and exercise are not working.  . If you smoke, find out from your health care provider how to quit. If you do not use tobacco, please do not start.  . If you choose to drink alcohol, please do not consume more than 2 drinks per day. One drink is considered to be 12 ounces (355 mL) of beer, 5 ounces (148 mL) of wine, or 1.5  ounces (44 mL) of liquor.  . If you are 32-46 years old, ask your health care provider if you should take aspirin to prevent strokes.  . Use sunscreen. Apply sunscreen liberally and repeatedly throughout the day. You should seek shade when your shadow is shorter than you. Protect yourself by wearing long sleeves, pants, a wide-brimmed hat, and sunglasses year round, whenever you are outdoors.  . Once a month, do a whole body skin exam, using a mirror to look at the skin on your back. Tell your health care provider of new moles, moles that have irregular borders, moles that are larger than a pencil eraser, or moles that have changed in shape or color.

## 2018-11-12 ENCOUNTER — Encounter: Payer: Self-pay | Admitting: Family Medicine

## 2018-11-12 ENCOUNTER — Ambulatory Visit (INDEPENDENT_AMBULATORY_CARE_PROVIDER_SITE_OTHER): Payer: Medicare Other | Admitting: Family Medicine

## 2018-11-12 VITALS — BP 118/74 | HR 63 | Temp 97.6°F | Ht 60.5 in | Wt 175.5 lb

## 2018-11-12 DIAGNOSIS — M2550 Pain in unspecified joint: Secondary | ICD-10-CM

## 2018-11-12 DIAGNOSIS — I10 Essential (primary) hypertension: Secondary | ICD-10-CM

## 2018-11-12 DIAGNOSIS — Z0000 Encounter for general adult medical examination without abnormal findings: Secondary | ICD-10-CM

## 2018-11-12 DIAGNOSIS — E119 Type 2 diabetes mellitus without complications: Secondary | ICD-10-CM | POA: Diagnosis not present

## 2018-11-12 DIAGNOSIS — I420 Dilated cardiomyopathy: Secondary | ICD-10-CM

## 2018-11-12 DIAGNOSIS — E782 Mixed hyperlipidemia: Secondary | ICD-10-CM

## 2018-11-12 DIAGNOSIS — Z7189 Other specified counseling: Secondary | ICD-10-CM

## 2018-11-12 LAB — BASIC METABOLIC PANEL
BUN: 25 mg/dL — ABNORMAL HIGH (ref 6–23)
CALCIUM: 9.3 mg/dL (ref 8.4–10.5)
CHLORIDE: 107 meq/L (ref 96–112)
CO2: 29 mEq/L (ref 19–32)
CREATININE: 1.28 mg/dL — AB (ref 0.40–1.20)
GFR: 51.84 mL/min — ABNORMAL LOW (ref >60.00)
Glucose, Bld: 104 mg/dL — ABNORMAL HIGH (ref 70–99)
Potassium: 4.6 mEq/L (ref 3.5–5.1)
Sodium: 142 mEq/L (ref 135–145)

## 2018-11-12 MED ORDER — FUROSEMIDE 20 MG PO TABS: ORAL_TABLET | ORAL | Status: DC

## 2018-11-12 MED ORDER — ACETAMINOPHEN 325 MG PO TABS: 325.0000 mg | ORAL_TABLET | Freq: Two times a day (BID) | ORAL | Status: DC | PRN

## 2018-11-12 MED ORDER — METFORMIN HCL 500 MG PO TABS: ORAL_TABLET | ORAL | Status: DC

## 2018-11-12 NOTE — Progress Notes (Signed)
Diabetes:  Using medications without difficulties: she is taking 500mg  metformin a day.   Hypoglycemic episodes: no Hyperglycemic episodes:no Feet problems:no Blood Sugars averaging: ~100 eye exam within last year: yes We talked about setting up nutrition referral.  Ordered.  Hypertension:    Using medication without problems or lightheadedness: yes Chest pain with exertion:no Edema: no Short of breath: this is better than prev.  She can walk 5 laps now, 1/2 mile.  Prev she was down to 1 lap.    Elevated Cholesterol: Using medications without problems: yes Muscle aches: minimal aches.  She can tolerate statin. D/w pt.   Diet compliance: encouraged, d/w pt.  Exercise: yes Labs d/w pt.   Joint pain.  Taking tramadol and tylenol with relief w/o ADE.  D/w pt.  Reasonable to continue.    She had recent sleep test, I am awaiting f/u data.  She has pulmonary f/u pending.    CHF.  D/w pt about sodium cautions.  She has f/u in 12/2018.    Flu shot 2019 Shingles out of stock  PNA up to date  Tetanus 2008, d/w pt.   Colonoscopy 2014  Breast cancer screening d/w pt.  See avs.   DXA 2019 Advance directive- husband designated if patient were incapacitated.  Hearing d/w pt.  She has noted subjective loss.  She is going to f/u and I'll defer.    PMH and SH reviewed.   Vital signs, Meds and allergies reviewed.  ROS: Per HPI unless specifically indicated in ROS section   GEN: nad, alert and oriented HEENT: mucous membranes moist NECK: supple w/o LA CV: rrr PULM: ctab, no inc wob ABD: soft, +bs EXT: no edema SKIN: no acute changes but she has chronic changes at baseline.    Diabetic foot exam: Normal inspection No skin breakdown No calluses  Normal DP pulses Normal sensation to light tough and monofilament Nails normal except for absent R 1st nail and thick L 1st nail.

## 2018-11-12 NOTE — Patient Instructions (Addendum)
Go to the lab on the way out.  We'll contact you with your lab report. We will call about your referral.  Shirlee Limerick or Alvina Chou will call you if you don't see one of them on the way out.  I want to recheck your potassium and kidney function.  Neither of the previous levels are alarming but I to recheck them.    The tetanus shot may be cheaper at the pharmacy.    You can call for a mammogram at the [facility] [address] [place]  Recheck in 3 months.  The only lab you need to have done for your next diabetic visit is an A1c.  We can do this with a fingerstick test at the office visit.  You do not need a lab visit ahead of time for this.  It does not matter if you are fasting when the lab is done.    Take care.  Glad to see you.

## 2018-11-14 ENCOUNTER — Telehealth: Payer: Self-pay | Admitting: *Deleted

## 2018-11-14 ENCOUNTER — Encounter: Payer: Self-pay | Admitting: Family Medicine

## 2018-11-14 DIAGNOSIS — Z0000 Encounter for general adult medical examination without abnormal findings: Secondary | ICD-10-CM | POA: Insufficient documentation

## 2018-11-14 DIAGNOSIS — H903 Sensorineural hearing loss, bilateral: Secondary | ICD-10-CM | POA: Diagnosis not present

## 2018-11-14 NOTE — Assessment & Plan Note (Signed)
Continue work on diet and exercise.  She can tolerate statin as is.  Labs discussed with patient.  She agrees.

## 2018-11-14 NOTE — Assessment & Plan Note (Signed)
Reasonable to continue Tylenol and tramadol.  No adverse effect on medication.  She does get some benefit.

## 2018-11-14 NOTE — Assessment & Plan Note (Signed)
On a recheck her labs today given her previous potassium and creatinine.  Discussed with patient.  She is able to walk farther now than she could previously.  No change in meds at this point.  She agrees.

## 2018-11-14 NOTE — Assessment & Plan Note (Signed)
See above.  Compliant with current medications.  Recheck labs today.  She has cardiology follow-up pending.  She also had recent sleep apnea test done but I am awaiting the results.  Discussed with patient.

## 2018-11-14 NOTE — Assessment & Plan Note (Signed)
Flu shot 2019 Shingles out of stock  PNA up to date  Tetanus 2008, d/w pt.   Colonoscopy 2014  Breast cancer screening d/w pt.  See avs.   DXA 2019 Advance directive- husband designated if patient were incapacitated.  Hearing d/w pt.  She has noted subjective loss.  She is going to f/u and I'll defer.

## 2018-11-14 NOTE — Assessment & Plan Note (Addendum)
A1c still controlled.  Discussed medication options.  Reasonable to continue metformin 500 mg a day.  Refer to nutrition.  Recheck periodically.  Routine foot care discussed with patient.  She agrees.  >25 minutes spent in face to face time with patient, >50% spent in counselling or coordination of care.

## 2018-11-14 NOTE — Telephone Encounter (Signed)
Left message on voicemail for patient to call back. Need to given patient her lab results.

## 2018-11-14 NOTE — Assessment & Plan Note (Signed)
Advance directive- husband designated if patient were incapacitated.  

## 2018-11-15 ENCOUNTER — Encounter: Payer: Self-pay | Admitting: Registered"

## 2018-11-15 ENCOUNTER — Encounter: Payer: Medicare Other | Attending: Family Medicine | Admitting: Registered"

## 2018-11-15 DIAGNOSIS — E119 Type 2 diabetes mellitus without complications: Secondary | ICD-10-CM | POA: Diagnosis not present

## 2018-11-15 NOTE — Telephone Encounter (Signed)
Patient advised.

## 2018-11-15 NOTE — Patient Instructions (Signed)
-   Aim to have protein + carbohydrates + vegetables at lunch and dinner.   - Listen to your body for satisfaction at meal times.   - Make 1/2 plate non-starchy vegetables.   - Try using [place] necks as substitute for fat back.

## 2018-11-15 NOTE — Progress Notes (Signed)
Diabetes Self-Management Education  Visit Type:  First/Initial  Appt. Start Time: 2:40 Appt. End Time: 3:55  11/15/2018  [name], identified by name and date of birth, is a 77 y.o. female with a diagnosis of Diabetes: Type 2.   ASSESSMENT  Pt states she has increased medications and likes it because it helps limit appetite and weight loss. Pt states her eating is wild. Pt reports weights fluctuates between 168-178. Pt states she checks weight every morning for fluid retention.   Pt expectations: how to control eating, food plan to help make a difference in the way she is eating  Pt states she wants to lose weight due to heart problems. Pt states the more she weighs, the more winded she gets when walking. Pt states she does not know how to stop eating after feeling full. Pt states she likes to finish her plate and does not know how to limit salt use when cooking food. Pt states she seasons with fat back and butter.    There were no vitals taken for this visit. There is no height or weight on file to calculate BMI.   Diabetes Self-Management Education - 11/15/18 1449      Health Coping   How would you rate your overall health?  Fair      Psychosocial Assessment   Patient Belief/Attitude about Diabetes  Motivated to manage diabetes    Self-care barriers  None    Self-management support  Family;Doctor's office    Patient Concerns  Nutrition/Meal planning    Special Needs  None    Preferred Learning Style  No preference indicated    Learning Readiness  Ready      Complications   Last HgB A1C per patient/outside source  6.7 %    How often do you check your blood sugar?  0 times/day (not testing)    Fasting Blood glucose range (mg/dL)  16-109   60-454   Number of hypoglycemic episodes per month  0    Number of hyperglycemic episodes per week  0    Have you had a dilated eye exam in the past 12 months?  Yes    Have you had a dental exam in the past 12 months?  Yes    Are you checking your feet?  No      Dietary Intake   Breakfast  smoothie (carrots, celery, apple, flaxseed, oats, kale/spinach, bananas, strawberries, grapes) + unsalted peanuts or [employer]-sausage biscuit    Snack (morning)  none    Lunch  chuck roast + yellow rice + broccoli    12-3pm   Snack (afternoon)  chips + nuts + candy bar    Dinner  cereal + water + grapes    Snack (evening)  cookie    Beverage(s)  water, strawberry lemonade      Exercise   Exercise Type  Moderate (swimming / aerobic walking);Light (walking / raking leaves)   water aerobics + walking   How many days per week to you exercise?  4    How many minutes per day do you exercise?  60    Total minutes per week of exercise  240      Patient Education   Previous Diabetes Education  Yes (please comment)    Disease state   Definition of diabetes, type 1 and 2, and the diagnosis of diabetes;Factors that contribute to the development of diabetes    Nutrition management   Role of diet in the treatment of  diabetes and the relationship between the three main macronutrients and blood glucose level;Food label reading, portion sizes and measuring food.    Monitoring  Identified appropriate SMBG and/or A1C goals.    Acute complications  Taught treatment of hypoglycemia - the 15 rule.;Discussed and identified patients' treatment of hyperglycemia.    Chronic complications  Reviewed with patient heart disease, higher risk of, and prevention    Psychosocial adjustment  Role of stress on diabetes      Individualized Goals (developed by patient)   Nutrition  General guidelines for healthy choices and portions discussed    Physical Activity  Exercise 3-5 times per week;60 minutes per day    Medications  take my medication as prescribed    Monitoring   test my blood glucose as discussed      Post-Education Assessment   Patient understands the diabetes disease and treatment process.  Demonstrates understanding / competency     Patient understands incorporating nutritional management into lifestyle.  Needs Review    Patient undertands incorporating physical activity into lifestyle.  Demonstrates understanding / competency    Patient understands using medications safely.  Demonstrates understanding / competency    Patient understands monitoring blood glucose, interpreting and using results  Demonstrates understanding / competency    Patient understands prevention, detection, and treatment of acute complications.  Demonstrates understanding / competency    Patient understands prevention, detection, and treatment of chronic complications.  Needs Review    Patient understands how to develop strategies to address psychosocial issues.  Needs Review    Patient understands how to develop strategies to promote health/change behavior.  Needs Review      Outcomes   Program Status  Not Completed      Subsequent Visit   Since your last visit have you continued or begun to take your medications as prescribed?  Yes    Since your last visit have you experienced any weight changes?  --   fluctuates between 168-178   Since your last visit, are you checking your blood glucose at least once a day?  No       Learning Objective:  Patient will have a greater understanding of diabetes self-management. Patient education plan is to attend individual and/or group sessions per assessed needs and concerns.   Plan:   Patient Instructions  - Aim to have protein + carbohydrates + vegetables at lunch and dinner.   - Listen to your body for satisfaction at meal times.   - Make 1/2 plate non-starchy vegetables.   - Try using [place] necks as substitute for fat back.       Expected Outcomes:  Demonstrated interest in learning. Expect positive outcomes  Education material provided: ADA Diabetes: Your Take Control Guide  If problems or questions, patient to contact team via:  Phone and Email  Future DSME appointment: - 4-6 wks

## 2018-11-19 ENCOUNTER — Encounter: Payer: Self-pay | Admitting: Family Medicine

## 2018-11-22 ENCOUNTER — Telehealth (HOSPITAL_COMMUNITY): Payer: Self-pay | Admitting: Licensed Clinical Social Worker

## 2018-11-22 NOTE — Telephone Encounter (Signed)
CSW received call from pt inquiring about [employer] foundation for her Sherryll Burger- she is concerned about being approved for the fund for next year.    CSW looked up patient on [employer] foundation site and confirmed she still had $109 left in fund and that it was good until June of 2020- CSW called pt to inform of remaining funding and that it would still be good into next year- informed pt she should call clinic back once her funding runs out and we would have to monitor the foundation for when it becomes available again- pt expressed understanding.  Burna Sis, LCSW Clinical Social Worker [facility] [phone]

## 2018-11-28 DIAGNOSIS — E119 Type 2 diabetes mellitus without complications: Secondary | ICD-10-CM | POA: Diagnosis not present

## 2018-12-10 ENCOUNTER — Ambulatory Visit: Payer: Medicare Other | Admitting: Internal Medicine

## 2018-12-10 ENCOUNTER — Encounter: Payer: Self-pay | Admitting: Internal Medicine

## 2018-12-10 VITALS — BP 122/60 | HR 69 | Ht 61.0 in | Wt 181.6 lb

## 2018-12-10 DIAGNOSIS — G4731 Primary central sleep apnea: Secondary | ICD-10-CM | POA: Diagnosis not present

## 2018-12-10 DIAGNOSIS — Z6830 Body mass index (BMI) 30.0-30.9, adult: Secondary | ICD-10-CM | POA: Diagnosis not present

## 2018-12-10 NOTE — Patient Instructions (Signed)
You have trivial sleep apnea.  I suggested you try to lose some weight and sleep off the flat of your back.  You might try using a mouth piece for snoring, from a drug store.  Please call if we can help

## 2018-12-10 NOTE — Progress Notes (Signed)
08/23/2018-77 year old female former smoker for sleep evaluation. Sleep Consult: Self Referral: Pt notes snoring, has family hx of CPAP.  Medical problem list includes dilated cardiomyopathy, CHF/systolic and diastolic, DM 2, HBP, icthyosis She wants to find out if she has sleep apnea.  Several siblings use CPAP.  Family reports loud snoring.  For the last 6 weeks she has been putting a piece of paper tape across her mouth at bedtime and says she sleeps better like this with less waking. Feels rested in the daytime.  Denies taking sleep medicines.  Little caffeine.  No ENT surgery. Wants to wait on flu shot.  Denies lung disease and says her cardiac status is under control, working with cardiology. Epworth score 6  12/10/2018-76 year old female former smoker followed for OSA, complicated by dilated cardiomyopathy, CHF/systolic and diastolic, DM 2, HBP, ichthyosis HST-10/24/2018 Complex (central and obstructive) sleep apnea, AHI 5.9/hour, desaturation to 81%, body weight 178 pounds We discussed her minimal sleep apnea.  She was mostly concerned because of a strong family history of sleep apnea requiring CPAP.  She was very pleased when I told her that conservative measures would probably be sufficient.  She understands the value of weight loss she avoids sleeping on her back.  She might be able to benefit from an OTC oral appliance for snoring.  ROS-see HPI   + = positive Constitutional:    weight loss, night sweats, fevers, chills, fatigue, lassitude. HEENT:    headaches, difficulty swallowing, tooth/dental problems, sore throat,       sneezing, itching, ear ache, nasal congestion, post nasal drip, snoring CV:    chest pain, orthopnea, PND, swelling in lower extremities, anasarca,                                  dizziness, +palpitations Resp:   +shortness of breath with exertion or at rest.                productive cough,   non-productive cough, coughing up of blood.              change in color  of mucus.  wheezing.   Skin:    +rash or lesions. GI:  No-   heartburn, indigestion, abdominal pain, nausea, vomiting, diarrhea,                 change in bowel habits, loss of appetite GU: dysuria, change in color of urine, no urgency or frequency.   flank pain. MS:   joint pain, stiffness, decreased range of motion, back pain. Neuro-     nothing unusual Psych:  change in mood or affect.  depression or anxiety.   memory loss.  OBJ- Physical Exam General- Alert, Oriented, Affect-appropriate, Distress- none acute, + obese Skin- + ichthyosis with hyperpigmentation Lymphadenopathy- none Head- atraumatic            Eyes- Gross vision intact, PERRLA, conjunctivae and secretions clear            Ears-+ hard of hearing            Nose-+ turbinate edema, no-Septal dev, mucus, polyps, erosion, perforation             Throat- Mallampati IV , mucosa clear , drainage- none, tonsils- atrophic, + own teeth Neck- flexible , trachea midline, no stridor , thyroid nl, carotid no bruit Chest - symmetrical excursion , unlabored  Heart/CV- RRR , no murmur , no gallop  , no rub, nl s1 s2                           - JVD- none , edema- none, stasis changes- none, varices- none           Lung- clear to P&A, wheeze- none, cough- none , dullness-none, rub- none           Chest wall-  Abd-  Br/ Gen/ Rectal- Not done, not indicated Extrem- cyanosis- none, clubbing, none, atrophy- none, strength- nl Neuro- grossly intact to observation

## 2018-12-10 NOTE — Assessment & Plan Note (Addendum)
Weight loss would be of significant benefit for several health problems and is encouraged.

## 2018-12-10 NOTE — Assessment & Plan Note (Signed)
Her very minimal sleep apnea syndrome is unlikely to have medical consequences.  We discussed conservative management and sleep hygiene.  She does not want CPAP but might be interested in an non-fitted oral appliance for snoring.  I emphasized the benefit of weight loss as a goal.

## 2018-12-18 ENCOUNTER — Ambulatory Visit: Payer: Medicare Other | Admitting: Registered"

## 2018-12-21 DIAGNOSIS — H905 Unspecified sensorineural hearing loss: Secondary | ICD-10-CM | POA: Diagnosis not present

## 2018-12-26 ENCOUNTER — Telehealth: Payer: Self-pay | Admitting: Internal Medicine

## 2018-12-26 NOTE — Telephone Encounter (Signed)
Called patient, unable to reach left message to give us a call back. 

## 2018-12-27 NOTE — Telephone Encounter (Signed)
Attempted to call pt. Call went straight to voicemail. I have left a message for the pt to return our call.

## 2018-12-27 NOTE — Telephone Encounter (Signed)
Pt is returning call. Per pt, she would like to know which specific mouth guard she was recommended to use. Pt also would like to know where to get this mouth guard. Cb is [phone].

## 2018-12-27 NOTE — Telephone Encounter (Signed)
ATC pt, no answer. Left message for pt to call back.  

## 2018-12-27 NOTE — Telephone Encounter (Signed)
Dr Maple Hudson- is there a specific brand of mouthpiece for snoring that you want to rec for this pt?  Please advise thanks

## 2018-12-27 NOTE — Telephone Encounter (Signed)
She would need to check different drug stores to see what they have for mouth pieces specifically for snoring. These are NOT the ones to keep you from grinding your teeth.

## 2018-12-28 NOTE — Telephone Encounter (Signed)
Spoke with pt. She is aware of CY's response. Nothing further was needed at this time. 

## 2019-01-08 ENCOUNTER — Telehealth (HOSPITAL_COMMUNITY): Payer: Self-pay | Admitting: Licensed Clinical Social Worker

## 2019-01-08 NOTE — Telephone Encounter (Signed)
CSW called pt to see if she is interested in renewing [employer]- pt agreeable and was awarded grant:  Member ID - [id number] Disease Fund - Heart Failure 2nd grant amount - $1,000 Eligibility End Date - 06/06/2019 Claims Submission End Date - 08/05/2019  Pt informed- no further needs at this time  Burna Sis, LCSW Clinical Social Worker [facility] [phone]

## 2019-01-09 ENCOUNTER — Other Ambulatory Visit: Payer: Self-pay

## 2019-01-09 ENCOUNTER — Ambulatory Visit (HOSPITAL_COMMUNITY)
Admission: RE | Admit: 2019-01-09 | Discharge: 2019-01-09 | Disposition: A | Discharge status: Home / Self Care | Payer: Medicare Other | Source: Ambulatory Visit | Attending: Internal Medicine | Admitting: Internal Medicine

## 2019-01-09 ENCOUNTER — Ambulatory Visit (HOSPITAL_BASED_OUTPATIENT_CLINIC_OR_DEPARTMENT_OTHER)
Admission: RE | Admit: 2019-01-09 | Discharge: 2019-01-09 | Disposition: A | Discharge status: Home / Self Care | Payer: Medicare Other | Source: Ambulatory Visit | Attending: Internal Medicine | Admitting: Internal Medicine

## 2019-01-09 ENCOUNTER — Encounter (HOSPITAL_COMMUNITY): Payer: Self-pay | Admitting: Internal Medicine

## 2019-01-09 ENCOUNTER — Telehealth: Payer: Self-pay | Admitting: Internal Medicine

## 2019-01-09 VITALS — BP 120/70 | HR 64 | Wt 182.2 lb

## 2019-01-09 DIAGNOSIS — Z801 Family history of malignant neoplasm of trachea, bronchus and lung: Secondary | ICD-10-CM | POA: Insufficient documentation

## 2019-01-09 DIAGNOSIS — I083 Combined rheumatic disorders of mitral, aortic and tricuspid valves: Secondary | ICD-10-CM | POA: Insufficient documentation

## 2019-01-09 DIAGNOSIS — E119 Type 2 diabetes mellitus without complications: Secondary | ICD-10-CM | POA: Insufficient documentation

## 2019-01-09 DIAGNOSIS — E669 Obesity, unspecified: Secondary | ICD-10-CM | POA: Insufficient documentation

## 2019-01-09 DIAGNOSIS — I110 Hypertensive heart disease with heart failure: Secondary | ICD-10-CM | POA: Insufficient documentation

## 2019-01-09 DIAGNOSIS — Z8249 Family history of ischemic heart disease and other diseases of the circulatory system: Secondary | ICD-10-CM | POA: Diagnosis not present

## 2019-01-09 DIAGNOSIS — Z888 Allergy status to other drugs, medicaments and biological substances status: Secondary | ICD-10-CM | POA: Diagnosis not present

## 2019-01-09 DIAGNOSIS — I472 Ventricular tachycardia: Secondary | ICD-10-CM | POA: Diagnosis not present

## 2019-01-09 DIAGNOSIS — Z7984 Long term (current) use of oral hypoglycemic drugs: Secondary | ICD-10-CM | POA: Insufficient documentation

## 2019-01-09 DIAGNOSIS — R911 Solitary pulmonary nodule: Secondary | ICD-10-CM | POA: Diagnosis not present

## 2019-01-09 DIAGNOSIS — I10 Essential (primary) hypertension: Secondary | ICD-10-CM | POA: Diagnosis not present

## 2019-01-09 DIAGNOSIS — Z882 Allergy status to sulfonamides status: Secondary | ICD-10-CM | POA: Diagnosis not present

## 2019-01-09 DIAGNOSIS — Z79899 Other long term (current) drug therapy: Secondary | ICD-10-CM | POA: Insufficient documentation

## 2019-01-09 DIAGNOSIS — E785 Hyperlipidemia, unspecified: Secondary | ICD-10-CM | POA: Insufficient documentation

## 2019-01-09 DIAGNOSIS — I2510 Atherosclerotic heart disease of native coronary artery without angina pectoris: Secondary | ICD-10-CM | POA: Diagnosis not present

## 2019-01-09 DIAGNOSIS — Z833 Family history of diabetes mellitus: Secondary | ICD-10-CM | POA: Insufficient documentation

## 2019-01-09 DIAGNOSIS — I493 Ventricular premature depolarization: Secondary | ICD-10-CM | POA: Diagnosis not present

## 2019-01-09 DIAGNOSIS — Z7982 Long term (current) use of aspirin: Secondary | ICD-10-CM | POA: Diagnosis not present

## 2019-01-09 DIAGNOSIS — I5022 Chronic systolic (congestive) heart failure: Secondary | ICD-10-CM | POA: Diagnosis not present

## 2019-01-09 DIAGNOSIS — Z887 Allergy status to serum and vaccine status: Secondary | ICD-10-CM | POA: Insufficient documentation

## 2019-01-09 DIAGNOSIS — Z87891 Personal history of nicotine dependence: Secondary | ICD-10-CM | POA: Diagnosis not present

## 2019-01-09 DIAGNOSIS — I428 Other cardiomyopathies: Secondary | ICD-10-CM | POA: Diagnosis not present

## 2019-01-09 DIAGNOSIS — I5042 Chronic combined systolic (congestive) and diastolic (congestive) heart failure: Secondary | ICD-10-CM | POA: Diagnosis not present

## 2019-01-09 LAB — BASIC METABOLIC PANEL
Anion gap: 9 (ref 5–15)
BUN: 27 mg/dL — ABNORMAL HIGH (ref 8–23)
CO2: 28 mmol/L (ref 22–32)
CREATININE: 1.29 mg/dL — AB (ref 0.44–1.00)
Calcium: 9.4 mg/dL (ref 8.9–10.3)
Chloride: 103 mmol/L (ref 98–111)
GFR calc Af Amer: 46 mL/min — ABNORMAL LOW (ref >60)
GFR, EST NON AFRICAN AMERICAN: 40 mL/min — AB (ref >60)
GLUCOSE: 139 mg/dL — AB (ref 70–99)
Potassium: 4.6 mmol/L (ref 3.5–5.1)
SODIUM: 140 mmol/L (ref 135–145)

## 2019-01-09 NOTE — Progress Notes (Signed)
ADVANCED HF CLINIC NOTE  Referring Physician: Herbie BaltimoreHarding  Primary Care: Para Marchuncan Primary Cardiologist: Herbie BaltimoreHarding  HPI:  Natasha Woodard is a 78 y.o. female with a h/o HL, HTN, DM2 and systolic HF due to NICM referred by Dr. Herbie BaltimoreHarding for further evaluation of her HF.   Natasha Woodard was initially evaluated in the summer 2018 and was noted to have frequent PVCs on EKG with a low resting heart rate. Holter showed 13% PVCs. Echo showed an EF of 20-30% and a Holter monitor which revealed significant amount of ventricular ectopy. She was also referred for right and left heart catheterization back in July that showed minimal CAD with EF 25-30%, LVEDP 26 mmHg with PCWP of only 16 mHg).  She has been treated medically. F/u echo 10/26/2017  2 D Echo 10/26/2017: EF remains 30-35%.  Diffuse hypokinesis noted.  Severe LA dilation. RV normal  She returns today for regular follow up. At last visit was having more PVCs on ECg so we placed ZioPatch in 11/19. Results as below < 1% PVC. Feels good> doing water aerobics 2 days per week. 2 other days per week does floor exercises with stretching and walking. No CP or SOB. No edema. [place] [facility] [place] [facility] and [place] 1 mile.   Echo today EF 55% Personally reviewed   Echo 6/19 EF 35-40%  CMRI: 01/17/18 IMPRESSION: 1.  Difficult study due to frequent PVCs. 2.  Normal LV size with EF 27%, diffuse hypokinesis 3.  Mildly dilated RV with normal systolic function 4. No myocardial LGE, so no definitive evidence for prior MI, infiltrative disease, or myocarditis. Suspect nonischemic cardiomyopathy, possibly due to PVCs.  Event monitor 01/20/18 Normal sinus rhythm with frequent multifocal PVCs (9%) and PACs. Nighttime bradycardia suggestive of OSA.   ZioPatch 11/19  1. Predominant underlying rhythm was Sinus Rhythm. Min HR of 46 bpm, max HR of 143 bpm, and avg HR of 62 bpm. 2. Two brief runs NSVT the longest lasting 4 beats with an avg rate of 110 bpm. 3. Occasional PVCs (< 1.0%)      Past Medical History:  Diagnosis Date  . Calcific tendonitis 2008   Treatment of left leg  . Cardiomyopathy (HCC) 06/2017   a) Echo: EF 25 and 30%. GR 1 DD. Mild DD with elevated LVEDP. Mild valvular disease.; b) Cardiac MRI 2/'19: frequent PVCs (diffiuclt to interpret) - EF ~27% w/ diffuse HK.  No evidence of infarct, infiltrative Dz or myocarditis. -- ? if related to PVCs.  Mild RV dilation with normal function.; c) f/u Echo 6/'19: EF 35-40%. Gr 1 DD. Diffuse HK. Mod TR. Mild RV dilation.  . Chronic combined systolic and diastolic CHF, NYHA class 2 and ACA/AHA stage C (HCC)   . Coronary artery disease, non-occlusive    mild-moderate CAD 06/30/17 cath  . CTS (carpal tunnel syndrome)   . Diabetes mellitus    Type II  . Dysrhythmia    patient said that she cant remember what it is  . 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  Nighttime bradycardia suggestive of OSA.  Marland Kitchen. Hiatal hernia    with reflux  . Hyperlipidemia    Statin intolerant  . Hypertension   . Ichthyosis congenita   . NSVD (normal spontaneous vaginal delivery) 1971 & 1972  . Obesity   . Plantar fasciitis    Left  . Pulmonary nodule    Imaged multiple times and benign appearing  .  Wears glasses     Current Outpatient Medications  Medication Sig Dispense Refill  . acetaminophen (TYLENOL) 325 MG tablet Take 1-2 tablets (325-650 mg total) by mouth 2 (two) times daily as needed.    Marland Kitchen amiodarone (PACERONE) 200 MG tablet Take 0.5 tablets (100 mg total) by mouth daily. 45 tablet 3  . aspirin 81 MG tablet Take 1 tablet (81 mg total) by mouth daily.    . carvedilol (COREG) 25 MG tablet TAKE 1 TABLET BY MOUTH TWICE DAILY 60 tablet 5  . Cholecalciferol (VITAMIN D) 1000 UNITS capsule Take 1,000 Units by mouth daily.     Marland Kitchen ENTRESTO 97-103 MG TAKE 1 TABLET BY MOUTH TWICE DAILY 60 tablet 6  . furosemide (LASIX) 20 MG tablet Take 20 mg tablet every  Monday, Wednesday, Friday, if weight is more than 3 lbs in a day take an extra 20 mg tablet that day as needed.    . lovastatin (MEVACOR) 40 MG tablet Take 1 tablet (40 mg total) by mouth at bedtime. 90 tablet 3  . spironolactone (ALDACTONE) 25 MG tablet Take 0.5 tablets (12.5 mg total) by mouth daily. 45 tablet 3  . traMADol (ULTRAM) 50 MG tablet TAKE 1 TABLET BY MOUTH EVERY 12 HOURS AS NEEDED 180 tablet 1  . glucose blood (ACCU-CHEK AVIVA PLUS) test strip USE TO TEST BLOOD SUGAR ONCE DAILY FOR DM E11.9 100 each 1  . Lancets (ACCU-CHEK MULTICLIX) lancets USE ONE LANCET TO TEST BLOOD GLUCOSE DAILY FOR DM 250.00 102 each 3  . metFORMIN (GLUCOPHAGE) 500 MG tablet TAKE ONE TABLET BY MOUTH DAILY WITH BREAKFAST     No current facility-administered medications for this encounter.     Allergies  Allergen Reactions  . Ezetimibe-Simvastatin Other (See Comments)    REACTION: Muscle aches (side effect)  . Lipitor [Atorvastatin] Other (See Comments)    Leg weakness   . Tamiflu [Oseltamivir Phosphate] Other (See Comments)    nightmares  . Pravastatin Sodium Other (See Comments)    REACTION: Muscle aches (side effect)  . Sulfonamide Derivatives Nausea And Vomiting      Social History   Socioeconomic History  . Marital status: Married    Spouse name: Not on file  . Number of children: 2  . Years of education: Not on file  . Highest education level: Not on file  Occupational History    Employer: RETIRED  Social Needs  . Financial resource strain: Not on file  . Food insecurity:    Worry: Not on file    Inability: Not on file  . Transportation needs:    Medical: Not on file    Non-medical: Not on file  Tobacco Use  . Smoking status: Former Smoker    Years: 28.00    Types: Cigarettes    Last attempt to quit: 12/12/1993    Years since quitting: 25.0  . Smokeless tobacco: Never Used  Substance and Sexual Activity  . Alcohol use: No    Alcohol/week: 0.0 standard drinks  . Drug use: No    . Sexual activity: Never  Lifestyle  . Physical activity:    Days per week: Not on file    Minutes per session: Not on file  . Stress: Not on file  Relationships  . Social connections:    Talks on phone: Not on file    Gets together: Not on file    Attends religious service: Not on file    Active member of club or organization: Not on file  Attends meetings of clubs or organizations: Not on file    Relationship status: Not on file  . Intimate partner violence:    Fear of current or ex partner: Not on file    Emotionally abused: Not on file    Physically abused: Not on file    Forced sexual activity: Not on file  Other Topics Concern  . Not on file  Social History Narrative   Two kids, two grandchildren, two great grandchildren.    Married 1969   Retired.       Family History  Problem Relation Age of Onset  . Heart disease Mother        s/p pacemaker  . Diabetes Mother   . Heart disease Father   . Diabetes Father   . Heart disease Brother        MI  . Cancer Brother        Lung  . Hypertension Other   . Colon cancer Neg Hx   . Breast cancer Neg Hx     Vitals:   01/09/19 1058  BP: 120/70  Pulse: 64  SpO2: 90%  Weight: 82.6 kg (182 lb 3.2 oz)   Filed Weights   01/09/19 1058  Weight: 82.6 kg (182 lb 3.2 oz)    PHYSICAL EXAM: General:  Well appearing. No resp difficulty HEENT: normal Neck: supple. no JVD. Carotids 2+ bilat; no bruits. No lymphadenopathy or thryomegaly appreciated. Cor: PMI nondisplaced. Regular rate & rhythm. No rubs, gallops or murmurs. Lungs: clear Abdomen: obese soft, nontender, nondistended. No hepatosplenomegaly. No bruits or masses. Good bowel sounds. Extremities: no cyanosis, clubbing, rash, edema Neuro: alert & orientedx3, cranial nerves grossly intact. moves all 4 extremities w/o difficulty. Affect pleasant    ASSESSMENT & PLAN:  1. Chronic systolic HF - due to NICM. Cath 7/18 with minimal CAD. Initial EF 20-25%. Etiology  ? Frequent PVCs - Echo 11/18 EF 30-35%. - cMRI EF 27% No LGE. 2/19 - Echo 6/19 EF ~35-40%  - Echo 1/20 (toady) EF 55% - complete recovery of LV function with PVC suppression  Personally reviewed - Doing very well NYHA II.  - Volume status looks good.  - Holter monitor 2/19 9% PVCs  (Previous holter monitor 7/18 with 13% PVCs) - ZioPatch 11/19 <1% PVC - Continue entresto 97/103 - Continue carvedilol 18.75 bid - Continue spiro 12.5 daily. Potassium runs about 5.0 will not titrate. Check today    2. Frequent PVCs - Initial monitor with 13% PVCs 7/18. ? Etiology of NICM - F/u holter monitor 2/19 9% PVCs  -- ZioPatch 11/19 <1% PVC - Continue amio 100 daily - Has sleep study in November with Dr. Maple Hudson   3. HTN - Blood pressure well controlled. Continue current regimen.  4. DM2 - On metformin - Consider jardiance as needed  Arvilla Meres, MD  11:31 AM

## 2019-01-09 NOTE — Progress Notes (Signed)
  Echocardiogram 2D Echocardiogram has been performed.  Natasha Woodard 01/09/2019, 11:03 AM 

## 2019-01-09 NOTE — Telephone Encounter (Signed)
LMTCB

## 2019-01-09 NOTE — Telephone Encounter (Signed)
Spoke with the pt in the lobby  She is stating that CDY wanted her to try some sort of anti snoring device  She found something on [employer] called "somnifix mouth strips" and wants to know if CDY thinks this would be helpful to her  Please advise thanks!

## 2019-01-09 NOTE — Addendum Note (Signed)
Encounter addended by: Shea Stakes, RN on: 01/09/2019 11:46 AM  Actions taken: Order list changed, Diagnosis association updated, Charge Capture section accepted, Clinical Note Signed

## 2019-01-09 NOTE — Telephone Encounter (Signed)
I'm not familiar with that. Ok for her to try it and see if it helps.

## 2019-01-09 NOTE — Patient Instructions (Signed)
Labs were done today. We will call you with any ABNORMAL results. No news is good news!  Your physician wants you to follow-up in: 6 MONTHS You will receive a reminder letter in the mail two months in advance. If you don't receive a letter, please call our office to schedule the follow-up appointment.  

## 2019-01-10 NOTE — Telephone Encounter (Signed)
Pt states that she will be away from her phone and is requesting a voicemail be left with information provided. Cb is [phone].

## 2019-01-10 NOTE — Telephone Encounter (Signed)
Patient returning phone call.  Patient phone number is [phone].

## 2019-01-10 NOTE — Telephone Encounter (Signed)
Left a detailed with pt per her request with CY's response. Nothing further was needed.

## 2019-01-16 ENCOUNTER — Encounter: Payer: Self-pay | Admitting: Family Medicine

## 2019-01-18 ENCOUNTER — Other Ambulatory Visit: Payer: Self-pay | Admitting: Family Medicine

## 2019-01-18 MED ORDER — METFORMIN HCL 500 MG PO TABS: 250.0000 mg | ORAL_TABLET | Freq: Two times a day (BID) | ORAL | 3 refills | Status: DC

## 2019-01-23 ENCOUNTER — Telehealth (HOSPITAL_COMMUNITY): Payer: Self-pay

## 2019-01-23 NOTE — Telephone Encounter (Signed)
Error

## 2019-01-26 ENCOUNTER — Other Ambulatory Visit (HOSPITAL_COMMUNITY): Payer: Self-pay | Admitting: *Deleted

## 2019-01-26 DIAGNOSIS — I5042 Chronic combined systolic (congestive) and diastolic (congestive) heart failure: Secondary | ICD-10-CM

## 2019-01-29 ENCOUNTER — Ambulatory Visit: Payer: Self-pay | Admitting: *Deleted

## 2019-01-29 NOTE — Telephone Encounter (Signed)
Noted. Thanks.  If lightheaded, then okay to skip a dose of lasix.   If she hasn't been taking lasix at all, and if she is still lightheaded, then okay to skip one dose of spironolactone.   I'll see at the visit.  Thanks.

## 2019-01-29 NOTE — Telephone Encounter (Signed)
I spoke with pt and she feels OK now and wants to wait for appt on Friday with Dr Para March. I advised pt if her condition changes or worsens prior to appt pt can cb and be seen sooner or if at night can go to ED for eval. Pt voiced understanding and appreciative. Pt has appt 02/01/19 at 9:30.

## 2019-01-29 NOTE — Telephone Encounter (Signed)
Flow from [facility] called with a request to triage this patient.  Pt had several complaints. Pt stated that she has felt lightheaded over the past couple of days. And also she is having abd pain which would be the worst of everything that is going on with her.   She denies nausea, vomiting, abd distention. She has had diarrhea and the pain sometimes subsides when she has the diarrhea. Denies fever. Feels exhausted after exertion (exercing) She started wearing hearing aids about 3 weeks ago, has had problems adjusting them. And she wants to be sure the problem with her dizziness is not because of  wearing hearing aids. She stated she just does not feel well, just weak at times.  And would like an appointment with her provider only. Appointment scheduled for this Friday. Routing to flow at [facility] at [facility].   Reason for Disposition . Age > 60 years  Answer Assessment - Initial Assessment Questions 1. LOCATION: "Where does it hurt?"      Lower abd 2. RADIATION: "Does the pain shoot anywhere else?" (e.g., chest, back)     no 3. ONSET: "When did the pain begin?" (e.g., minutes, hours or days ago)      About a week 4. SUDDEN: "Gradual or sudden onset?"     gradual 5. PATTERN "Does the pain come and go, or is it constant?"    - If constant: "Is it getting better, staying the same, or worsening?"      (Note: Constant means the pain never goes away completely; most serious pain is constant and it progresses)     - If intermittent: "How long does it last?" "Do you have pain now?"     (Note: Intermittent means the pain goes away completely between bouts)     Comes and goes when she finishes eating 6. SEVERITY: "How bad is the pain?"  (e.g., Scale 1-10; mild, moderate, or severe)   - MILD (1-3): doesn't interfere with normal activities, abdomen soft and not tender to touch    - MODERATE (4-7): interferes with normal activities or awakens from sleep, tender to touch    - SEVERE (8-10):  excruciating pain, doubled over, unable to do any normal activities      moderate 7. RECURRENT SYMPTOM: "Have you ever had this type of abdominal pain before?" If so, ask: "When was the last time?" and "What happened that time?"      no 8. CAUSE: "What do you think is causing the abdominal pain?"     Not sure 9. RELIEVING/AGGRAVATING FACTORS: "What makes it better or worse?" (e.g., movement, antacids, bowel movement)     Not sure 10. OTHER SYMPTOMS: "Has there been any vomiting, diarrhea, constipation, or urine problems?"       Has had diarrhea after eating a meal, feels weak 11. PREGNANCY: "Is there any chance you are pregnant?" "When was your last menstrual period?"       n/a  Protocols used: ABDOMINAL PAIN - [facility]

## 2019-01-29 NOTE — Telephone Encounter (Signed)
Patient advised.

## 2019-01-31 ENCOUNTER — Telehealth (HOSPITAL_COMMUNITY): Payer: Self-pay

## 2019-01-31 NOTE — Telephone Encounter (Signed)
Referral received from MD Bensimhon for Pulmonary Rehab with diagnosis of chronic combined systolic and diastolic HF. Clinical review of pt follow up appt on 01/09/19 Cardiac office note and 12/10/18 pulmonary office note with Dr. Maple Hudson. Pt appropriate for scheduling for Pulmonary rehab.  Will forward to support staff for scheduling and verification of insurance eligibility/benefits with pt consent.   Glade Nurse, RN, BSN Cardiac and Pulmonary Rehab Nurse

## 2019-02-01 ENCOUNTER — Ambulatory Visit: Payer: Medicare Other | Admitting: Family Medicine

## 2019-02-02 ENCOUNTER — Other Ambulatory Visit: Payer: Self-pay | Admitting: Family Medicine

## 2019-02-04 NOTE — Telephone Encounter (Signed)
Electronic refill request Tramadol Last refill 08/07/18 #180/1 Last office visit 11/12/18  Upcoming appointment 02/05/2019

## 2019-02-05 ENCOUNTER — Encounter: Payer: Self-pay | Admitting: Family Medicine

## 2019-02-05 ENCOUNTER — Ambulatory Visit (INDEPENDENT_AMBULATORY_CARE_PROVIDER_SITE_OTHER): Payer: Medicare Other | Admitting: Family Medicine

## 2019-02-05 DIAGNOSIS — I5042 Chronic combined systolic (congestive) and diastolic (congestive) heart failure: Secondary | ICD-10-CM | POA: Diagnosis not present

## 2019-02-05 DIAGNOSIS — R109 Unspecified abdominal pain: Secondary | ICD-10-CM | POA: Diagnosis not present

## 2019-02-05 NOTE — Telephone Encounter (Signed)
Sent. Thanks.   

## 2019-02-05 NOTE — Patient Instructions (Signed)
If you more abdominal pain or recurrent diarrhea, then let me know.    I would restart the spironolactone and hold the lasix for now.  If you can tolerate the restart on the spironolactone, then you can add the lasix back after that.   If you get lightheaded again, then stop the lasix and let me know.  Take care.  Glad to see you.   It would be good to check you about diabetes in about 1 month.  We can do labs at the visit.  You don't have to do labs ahead of time.  You don't have to fast, so don't skip a meal before the visit.

## 2019-02-05 NOTE — Progress Notes (Signed)
She initially had nausea and central abd pain.  Then felt lightheaded. This was over the course of about 1 week.  She was still on her baseline meds at that point.   She stopped both lasix and spironolactone and she felt better.    Her abd pain is resolved.  No nausea now.  Never had burning with urination.  No vomiting.  Some diarrhea.  No blood in stool.    She is still on metformin.  Gradual inc in weight in the last year.   Meds, vitals, and allergies reviewed.   ROS: Per HPI unless specifically indicated in ROS section   GEN: nad, alert and oriented HEENT: mucous membranes moist NECK: supple w/o LA CV: rrr PULM: ctab, no inc wob ABD: soft, +bs, not ttp.  EXT: no edema SKIN: Well-perfused with chronic skin changes noted at baseline.

## 2019-02-06 DIAGNOSIS — R109 Unspecified abdominal pain: Secondary | ICD-10-CM | POA: Insufficient documentation

## 2019-02-06 NOTE — Assessment & Plan Note (Signed)
She was recently lightheaded.  She has had a gradual weight gain but not acute weight gain.  She stopped both Lasix and Spironolactone and she felt better.  This may have been a separate issue from the abdominal pain.  I would restart the spironolactone and hold the lasix for now.  If she can tolerate the restart on the spironolactone, then she can add the lasix back after that.   If lightheaded again, then she will stop the lasix and let me know.   She agrees with plan >25 minutes spent in face to face time with patient, >50% spent in counselling or coordination of care.

## 2019-02-06 NOTE — Assessment & Plan Note (Signed)
Currently resolved. If more abdominal pain or recurrent diarrhea, then she will let me know.    It does not appear that the metformin was contributing.  This may have been a separate issue from being lightheaded. I would observe for now.  She agrees.  It may be that she had a self-limited illness such as a transient viral infection.

## 2019-02-13 ENCOUNTER — Other Ambulatory Visit (HOSPITAL_COMMUNITY): Payer: Self-pay | Admitting: Internal Medicine

## 2019-02-13 ENCOUNTER — Other Ambulatory Visit: Payer: Self-pay | Admitting: Cardiology

## 2019-02-13 NOTE — Telephone Encounter (Signed)
Rx(s) sent to pharmacy electronically.  

## 2019-02-14 ENCOUNTER — Telehealth (HOSPITAL_COMMUNITY): Payer: Self-pay

## 2019-02-14 NOTE — Telephone Encounter (Signed)
Pt called and wanted to cancel PR due to program being too expensive she had a 20.00 copay per session. Cancel appt and closed referral. Tempie Donning. Support Rep II

## 2019-03-01 ENCOUNTER — Ambulatory Visit (HOSPITAL_COMMUNITY): Payer: Medicare Other

## 2019-03-06 ENCOUNTER — Telehealth: Payer: Self-pay

## 2019-03-06 MED ORDER — CEPHALEXIN 500 MG PO CAPS: 500.0000 mg | ORAL_CAPSULE | Freq: Two times a day (BID) | ORAL | 0 refills | Status: DC

## 2019-03-06 NOTE — Telephone Encounter (Signed)
Left message for pt to call office

## 2019-03-06 NOTE — Telephone Encounter (Signed)
Pt has had burning and pain upon urination for 1 wk; pt started drinking more water and symptoms stopped until this morning and again pt having burning and pain upon urination with slight frequency; no fever, no abd or back pain. Pt has no transportation to come to office. Pt request med sent to [facility] for bladder infection. Please advise.

## 2019-03-06 NOTE — Telephone Encounter (Signed)
If she has the ability to collect a urine sample that is sterile so we can get a culture done, then do so prior to starting abx.  However, I doubt she'll be able to do that given the circumstances.   rx sent.  Update [place] as needed.  Thanks.

## 2019-03-07 NOTE — Telephone Encounter (Signed)
Patient advised.

## 2019-03-26 ENCOUNTER — Other Ambulatory Visit (HOSPITAL_COMMUNITY): Payer: Self-pay | Admitting: Internal Medicine

## 2019-03-27 ENCOUNTER — Telehealth: Payer: Self-pay | Admitting: Cardiology

## 2019-03-27 NOTE — Telephone Encounter (Signed)
Spoke with patient who confirmed all demographics. She actively uses My Chart.  Has smart phone. She can do vitals except BP.

## 2019-03-27 NOTE — Telephone Encounter (Signed)
RN AWARE. 

## 2019-03-29 ENCOUNTER — Telehealth: Payer: Self-pay | Admitting: Cardiology

## 2019-03-29 NOTE — Telephone Encounter (Signed)
Smart phone/MyChart/pre reg completed. 03-29-19. ST Patient agrees to Doxy visit. 03-29-19 ST

## 2019-04-01 ENCOUNTER — Telehealth: Payer: Self-pay | Admitting: Cardiology

## 2019-04-01 ENCOUNTER — Telehealth (INDEPENDENT_AMBULATORY_CARE_PROVIDER_SITE_OTHER): Payer: Medicare Other | Admitting: Cardiology

## 2019-04-01 VITALS — Ht 63.0 in | Wt 179.0 lb

## 2019-04-01 DIAGNOSIS — I420 Dilated cardiomyopathy: Secondary | ICD-10-CM

## 2019-04-01 DIAGNOSIS — I5042 Chronic combined systolic (congestive) and diastolic (congestive) heart failure: Secondary | ICD-10-CM

## 2019-04-01 DIAGNOSIS — I10 Essential (primary) hypertension: Secondary | ICD-10-CM

## 2019-04-01 DIAGNOSIS — I493 Ventricular premature depolarization: Secondary | ICD-10-CM

## 2019-04-01 NOTE — Patient Instructions (Addendum)
Medication Instructions:  NO CHANGE If you need a refill on your cardiac medications before your next appointment, please call your pharmacy.   Lab work: If you have labs (blood work) drawn today and your tests are completely normal, you will receive your results only by: Marland Kitchen MyChart Message (if you have MyChart) OR . A paper copy in the mail If you have any lab test that is abnormal or we need to change your treatment, we will call you to review the results.  Follow-Up: At [facility], you and your health needs are our priority.  As part of our continuing mission to provide you with exceptional heart care, we have created designated Provider Care Teams.  These Care Teams include your primary Cardiologist (physician) and Advanced Practice Providers (APPs -  Physician Assistants and Nurse Practitioners) who all work together to provide you with the care you need, when you need it. You will need a follow up appointment in 7 months.  Please call our office 2 months in advance to schedule this appointment.  You may see Bryan Lemma, MD or one of the following Advanced Practice Providers on your designated Care Team:   Theodore Demark, PA-C . Joni Reining, DNP, ANP

## 2019-04-01 NOTE — Progress Notes (Signed)
Virtual Visit via Telephone Note   This visit type was conducted due to national recommendations for restrictions regarding the COVID-19 Pandemic (e.g. social distancing) in an effort to limit this patient's exposure and mitigate transmission in our community.  Due to her co-morbid illnesses, this patient is at least at moderate risk for complications without adequate follow up.  This format is felt to be most appropriate for this patient at this time.  The patient did not have access to video technology/had technical difficulties with video requiring transitioning to audio format only (telephone).  All issues noted in this document were discussed and addressed.  No physical exam could be performed with this format.  Please refer to the patient's chart for her  consent to telehealth for [facility].   Patient has given verbal permission to conduct this visit via virtual appointment and to bill insurance 03/27/2019 4:01 PM     Evaluation Performed:  Follow-up visit  Date:  04/01/2019   ID:  Natasha Woodard, Natasha Woodard [birth year], MRN [id number]  Patient Location: Home Provider Location: Office  PCP:  No primary care provider on file.  Cardiologist:  Glenetta Hew, MD  [facility] Cardiologist: Dr. Pierre Bali  Chief Complaint: 80-monthfollow-up  History of Present Illness:    Natasha Woodard a 78y.o. female with PMH notable for ischemic cardiomyopathy (thought to be related to frequent PVCs) now with improved EF who presents via audio/video conferencing for a telehealth visit today for 675-monthollow-up.  Natasha Woodard last seen by me back in October 2019--was doing well but just a little bit confused about Lasix instructions.  Was not sure about how to take her Lasix at that time.  Noted a little bit more exertional dyspnea and orthopnea before, but because she had not been taking her Lasix daily. --> Restarted Lasix 20 mg daily dosing.  Continued Entresto and carvedilol along  with amiodarone --> PVCs essentially ablated with amiodarone closer to 45%. Still doing water aerobics 2 days a week and walking at least 1 mile on treadmill 1 day a week.  She last saw Dr. BeHaroldine Lawsn on January 09, 2019 --> echo that day revealed EF 55%.  Well.  Less than 1% PVCs on ZIO patch monitor.  Noted that she continued walking & doing her water aerobics. --> NYHA II symptoms status good.  Continue spironolactone.  No change to Lasix dosing.  Interval History:  CaVinias being seen today via tele-visit doing very well.  She is no major cardiac complaints.  Is in great spirits.  Smiling. Exercise- 4 d strength & conditioning @ home (since COVID).  TM 2-3 d / wk  Hard time with her weight- but no edema.   Cardiovascular ROS: no chest pain or dyspnea on exertion positive for - irregular heartbeat and Minimal  negative for - edema, orthopnea, paroxysmal nocturnal dyspnea, rapid heart rate, shortness of breath or Syncope/near syncope, TIA shows amaurosis fugax.  Claudication.  The patient does have symptoms concerning for COVID-19 infection (fever, chills, cough, or new shortness of breath).  The patient is practicing social distancing.  ROS:  Please see the history of present illness.    Review of Systems  Constitutional: Negative for chills, fever, malaise/fatigue and weight loss (hard time trying to lose).  HENT: Negative for congestion and nosebleeds.   Respiratory: Negative for cough and shortness of breath.   Cardiovascular: Negative for claudication.  Gastrointestinal: Negative for blood in stool and  melena.  Genitourinary: Negative for hematuria.  Musculoskeletal: Negative for joint pain.  Neurological: Negative for dizziness.  Psychiatric/Behavioral: Negative for memory loss. The patient is not nervous/anxious and does not have insomnia.   All other systems reviewed and are negative.   Past Medical History:  Diagnosis Date  . Calcific tendonitis 2008   Treatment of  left leg  . Cardiomyopathy ([place]) 06/2017   a) Echo: EF 25 and 30%. GR 1 DD. Mild DD with elevated LVEDP. Mild valvular disease.; b) Cardiac MRI 2/'19: frequent PVCs (diffiuclt to interpret) - EF ~27% w/ diffuse HK.  No evidence of infarct, infiltrative Dz or myocarditis. -- ? if related to PVCs.  Mild RV dilation with normal function.; c) f/u Echo 6/'19: EF 35-40%. Gr 1 DD. Diffuse HK. Mod TR. Mild RV dilation.  . Chronic combined systolic and diastolic CHF, NYHA class 2 and ACA/AHA stage C ([place])   . Coronary artery disease, non-occlusive    mild-moderate CAD 06/30/17 cath  . CTS (carpal tunnel syndrome)   . Diabetes mellitus    Type II  . Dysrhythmia    patient said that she cant remember what it is  . 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  Nighttime bradycardia suggestive of OSA.  Marland Kitchen Hiatal hernia    with reflux  . Hyperlipidemia    Statin intolerant  . Hypertension   . Ichthyosis congenita   . NSVD (normal spontaneous vaginal delivery) 1971 & 1972  . Obesity   . Plantar fasciitis    Left  . Pulmonary nodule    Imaged multiple times and benign appearing  . Wears glasses    Past Surgical History:  Procedure Laterality Date  . ABDOMINAL HYSTERECTOMY  1975   TAH-BSO  . ABSCESS DRAINAGE  1972   right breast  . BREAST BIOPSY Left 01/14/2009   Stereo- Benign  . CARDIAC MRI  01/2018   Difficult to interpret 2/2 PVCs. Normal LV size - EF ~27% with diffuse HK. Mild RV dilation - normal fxn. -- NO MYOCARDIAL LGI => no definitive evidence of prior MI, Infiltrative Dz or Myocarditis -- suspect NICM, possibly related to PVCs.  . CARPAL TUNNEL RELEASE Right 07/02/2014   Procedure: RIGHT CARPAL TUNNEL RELEASE;  Surgeon: Wynonia Sours, MD;  Location: [place];  Service: Orthopedics;  Laterality: Right;  . CARPAL TUNNEL RELEASE Left 06/11/2015   Procedure: LEFT CARPAL TUNNEL RELEASE;  Surgeon: Daryll Brod, MD;  Location: [place];  Service: Orthopedics;  Laterality: Left;  REGIONAL/FAB  . COLONOSCOPY    . corn removal  1968   right  . Holter Monitor  06/2017   ~17,000 PVC beats - majority were singlets, some couplets. 44 brief 3-7 beat runs of NSVT. Also noted were less frequent PACs with 11 runs (longest 15 beats)  . OPEN REDUCTION INTERNAL FIXATION (ORIF) DISTAL RADIAL FRACTURE Left 07/21/2017   Procedure: OPEN REDUCTION INTERNAL FIXATION (ORIF) LEFT DISTAL RADIAL FRACTURE;  Surgeon: Renette Butters, MD;  Location: [place];  Service: Orthopedics;  Laterality: Left;  . RIGHT/LEFT HEART CATH AND CORONARY ANGIOGRAPHY N/A 06/30/2017   Procedure: Right/Left Heart Cath and Coronary Angiography;  Surgeon: Leonie Man, MD;  Location: [facility] INVASIVE CV LAB:  pRCA 55%, pCx 40%, OM1 45%, mCx 50%, D2 50% - LVEF 25-35%. Moderately elevated LVEDP(26 mmHg with PCWP 16 mmHg).  FICK CO/CI: 4.47/2.48. PA pressures 47/14 mmHg with a  mean of 27 mm.  Marland Kitchen ROTATOR CUFF REPAIR     left shoulder  . TRANSTHORACIC ECHOCARDIOGRAM  06/2017   EF 25 and 30%. GR 1 DD. Mild diastolic dysfunction with elevated LVEDP. Mild valvular disease.  . TRANSTHORACIC ECHOCARDIOGRAM  11'18, 6/'19    a) EF remains 30-35%.  Diffuse hypokinesis noted.  Severe LA dilation; b) Improved EF 35-40%.  Diffuse HK.  GR 1 DD.  Moderate TR.  Mild RV dilation.     Current Meds  Medication Sig  . amiodarone (PACERONE) 200 MG tablet Take 0.5 tablets (100 mg total) by mouth daily.  Marland Kitchen aspirin 81 MG tablet Take 1 tablet (81 mg total) by mouth daily.  . carvedilol (COREG) 25 MG tablet Take 1 tablet by mouth twice daily  . cephALEXin (KEFLEX) 500 MG capsule Take 1 capsule (500 mg total) by mouth 2 (two) times daily.  . Cholecalciferol (VITAMIN D) 1000 UNITS capsule Take 1,000 Units by mouth daily.   Marland Kitchen ENTRESTO 97-103 MG TAKE 1 TABLET BY MOUTH TWICE DAILY  . furosemide (LASIX) 20 MG tablet Take 20 mg tablet every Monday, Wednesday,  Friday, if weight is more than 3 lbs in a day take an extra 20 mg tablet that day as needed.  Marland Kitchen glucose blood (ACCU-CHEK AVIVA PLUS) test strip USE TO TEST BLOOD SUGAR ONCE DAILY FOR DM E11.9  . Lancets (ACCU-CHEK MULTICLIX) lancets USE ONE LANCET TO TEST BLOOD GLUCOSE DAILY FOR DM 250.00  . lovastatin (MEVACOR) 40 MG tablet Take 1 tablet (40 mg total) by mouth at bedtime.  . metFORMIN (GLUCOPHAGE) 500 MG tablet Take 0.5 tablets (250 mg total) by mouth 2 (two) times daily with a meal.  . spironolactone (ALDACTONE) 25 MG tablet Take 0.5 tablets (12.5 mg total) by mouth daily.  . traMADol (ULTRAM) 50 MG tablet TAKE 1 TABLET BY MOUTH EVERY 12 HOURS AS NEEDED     Allergies:   Ezetimibe-simvastatin; Lipitor [atorvastatin]; Tamiflu [oseltamivir phosphate]; Pravastatin sodium; and Sulfonamide derivatives   Social History   Tobacco Use  . Smoking status: Former Smoker    Years: 28.00    Types: Cigarettes    Last attempt to quit: 12/12/1993    Years since quitting: 25.3  . Smokeless tobacco: Never Used  Substance Use Topics  . Alcohol use: No    Alcohol/week: 0.0 standard drinks  . Drug use: No     Family Hx: The patient's family history includes Cancer in her brother; Diabetes in her father and mother; Heart disease in her brother, father, and mother; Hypertension in an other family member. There is no history of Colon cancer or Breast cancer.   Prior CV studies:   The following studies were reviewed today: . TTE January 09, 2019: EF 50 to 55%.  Impaired relaxation.  Notable improvement of LV function.  Moderate LA dilation.  Moderate MAC.  Aortic sclerosis with no stenosis.  ZioPatch in 11/19. Results as below < 1% PVC.  (Notably improved after starting amiodarone)  Labs/Other Tests and Data Reviewed:    EKG:  No ECG reviewed.  Recent Labs: 08/21/2018: Hemoglobin 14.1; Platelets 387.0 10/08/2018: ALT 15; B Natriuretic Peptide 338.7; TSH 1.429 01/09/2019: BUN 27; Creatinine, Ser 1.29;  Potassium 4.6; Sodium 140   Recent Lipid Panel Lab Results  Component Value Date/Time   CHOL 141 11/06/2018 11:48 AM   CHOL 179 11/17/2017 09:10 AM   TRIG 129.0 11/06/2018 11:48 AM   HDL 37.10 (L) 11/06/2018 11:48 AM   HDL 37 (L) 11/17/2017  09:10 AM   CHOLHDL 4 11/06/2018 11:48 AM   LDLCALC 79 11/06/2018 11:48 AM   LDLCALC 117 (H) 11/17/2017 09:10 AM   LDLDIRECT 53 08/10/2009    Wt Readings from Last 3 Encounters:  04/01/19 179 lb (81.2 kg)  02/05/19 185 lb 2 oz (84 kg)  01/09/19 182 lb 3.2 oz (82.6 kg)     Objective:    Vital Signs:  Ht _0  (1.6 m)   Wt 179 lb (81.2 kg)   BMI 31.71 kg/m  - no BP Appearing.  No acute distress.  Well-groomed. RESPIRATORY:  Nonlabored.  Normal effort.  No obvious distress CARDIOVASCULAR:  no peripheral edema NEURO:  alert and oriented x 3, no obvious focal deficit PSYCH:  normal affect    ASSESSMENT & PLAN:    Problem List Items Addressed This Visit    Chronic combined systolic and diastolic heart failure (HCC) - Primary (Chronic)   Dilated cardiomyopathy (HCC) (Chronic)   Essential hypertension (Chronic)   Frequent PVCs (Chronic)      Overall, her symptoms have stabilized.  PVCs are minimal with amiodarone.  EF has improved by echocardiogram suggesting resolution of her dilated cardiomyopathy --this would suggest that the cardiomyopathy was related to PVCs.  She probably still does have some diastolic heart failure, but may be class I-II symptoms.  Minimal edema with her standing dose of Lasix 3 days a week. Plan:  Continue Entresto and carvedilol along with spironolactone.  With minimal edema okay to continue with current plan with Lasix.  We will continue amiodarone for frequent PVCs much more stable now.  Is on lovastatin for hyperlipidemia.  Labs followed by PCP.Marland Kitchen   COVID-19 Education: The signs and symptoms of COVID-19 were discussed with the patient and how to seek care for testing (follow up with PCP or arrange  E-visit).   The importance of social distancing was discussed today.  Time:   Today, I have spent 20 minutes with the patient with telehealth technology discussing the above problems.     Medication Adjustments/Labs and Tests Ordered: Current medicines are reviewed at length with the patient today.  Concerns regarding medicines are outlined above.  Medication Instructions:  No new medication changes  Tests Ordered: No orders of the defined types were placed in this encounter.   Medication Changes: No orders of the defined types were placed in this encounter.   Disposition:  Follow up In November 2020    Signed, Glenetta Hew, MD  04/01/2019 1:56 PM    [place]

## 2019-04-01 NOTE — Telephone Encounter (Signed)
New Message    Pt is calling and says she is ready for the appt

## 2019-04-03 ENCOUNTER — Other Ambulatory Visit: Payer: Self-pay | Admitting: Family Medicine

## 2019-04-03 DIAGNOSIS — E119 Type 2 diabetes mellitus without complications: Secondary | ICD-10-CM

## 2019-04-05 ENCOUNTER — Other Ambulatory Visit: Payer: Medicare Other

## 2019-04-07 ENCOUNTER — Other Ambulatory Visit (HOSPITAL_COMMUNITY): Payer: Self-pay | Admitting: Internal Medicine

## 2019-04-08 ENCOUNTER — Ambulatory Visit: Payer: Medicare Other | Admitting: Family Medicine

## 2019-04-08 DIAGNOSIS — H6123 Impacted cerumen, bilateral: Secondary | ICD-10-CM | POA: Diagnosis not present

## 2019-04-08 DIAGNOSIS — H60333 Swimmer's ear, bilateral: Secondary | ICD-10-CM | POA: Diagnosis not present

## 2019-04-09 ENCOUNTER — Other Ambulatory Visit: Payer: Self-pay | Admitting: Family Medicine

## 2019-04-09 NOTE — Telephone Encounter (Signed)
Best number [phone] Pt would like her rx sent to  [facility]   90 supply  entresto 97-103mg  carbedilol tab 25mg  Lovastatin tab 40mg  Metformin tab 500mg  Amiodarone HCL tab 200mg  Tramadol  Furosemide 20mg  Spironolactone 25mg   Pt stated this would be easier for pt so she doesn't have to go to [facility] every week  She stated she got the one med yesterday and she gave this back to them she stated they didn't recognize this was the wrong rx until today

## 2019-04-10 MED ORDER — LOVASTATIN 40 MG PO TABS: 40.0000 mg | ORAL_TABLET | Freq: Every day | ORAL | 1 refills | Status: DC

## 2019-04-10 MED ORDER — AMIODARONE HCL 200 MG PO TABS: 100.0000 mg | ORAL_TABLET | Freq: Every day | ORAL | 1 refills | Status: DC

## 2019-04-10 MED ORDER — SACUBITRIL-VALSARTAN 97-103 MG PO TABS: 1.0000 | ORAL_TABLET | Freq: Two times a day (BID) | ORAL | 1 refills | Status: DC

## 2019-04-10 MED ORDER — SPIRONOLACTONE 25 MG PO TABS: ORAL_TABLET | ORAL | 1 refills | Status: DC

## 2019-04-10 MED ORDER — FUROSEMIDE 20 MG PO TABS: ORAL_TABLET | ORAL | 1 refills | Status: DC

## 2019-04-10 MED ORDER — METFORMIN HCL 500 MG PO TABS: 250.0000 mg | ORAL_TABLET | Freq: Two times a day (BID) | ORAL | 1 refills | Status: DC

## 2019-04-10 MED ORDER — CARVEDILOL 25 MG PO TABS: 25.0000 mg | ORAL_TABLET | Freq: Two times a day (BID) | ORAL | 1 refills | Status: DC

## 2019-04-10 NOTE — Telephone Encounter (Signed)
Patient called back. She would now like this sent to the  [facility]- [address]- [place]

## 2019-04-10 NOTE — Telephone Encounter (Signed)
I sent everything to the [facility] as requested except for Tramadol. Last filed 02-05-19 #180 with 1 refill so it is too early. Was not sure if Dr Para March wanted to go ahead and send it in with particular refill date in May or hold out. Please let us know so we can advise pt. Thanks.

## 2019-04-10 NOTE — Telephone Encounter (Signed)
We'll need to send the tramadol later when she needs it and runs out of refills.   I understand the point of lining up the refills, but I wasn't managing her amiodarone, entresto, carvedilol, or spironolactone.  That was coming through cardiology and given her situation, that should continue.  Please ask pharmacy to direct those requests to them (and change the current refills to them).  Thanks.

## 2019-04-11 NOTE — Telephone Encounter (Signed)
Spoke to [place] at the pharmacy. He said there was no where in their system that would allow them to change the name of the prescriber on the rx that was sent in. I am afraid that in a rush, it will not be seen in our system either the next time refill requests come in for them.

## 2019-04-11 NOTE — Telephone Encounter (Signed)
I am peripherally fine providing refills amiodarone, entresto, carvedilol, and spironolactone.   I was under the understanding that she did have refills for this already ordered.

## 2019-04-11 NOTE — Telephone Encounter (Signed)
Please contact cards and see if they can send in replacement rxs for amiodarone, entresto, carvedilol, and spironolactone.  That way the med refills would still line up.  Thanks.

## 2019-04-12 NOTE — Telephone Encounter (Signed)
Spoke with patient and advised of below. Patient wants to switch Tramadol to [facility] also. I called [facility] on [address] and cancelled refill they had on file. Also found out that patient is able to get 30 day supply of this medication at at a time due to her insurance. Sending medication for refill approval to Dr .Para March. patient aware unless there is a problem with this request to just check with pharmacy later today

## 2019-04-12 NOTE — Telephone Encounter (Signed)
Best number [phone] Pt called back checking on her refills

## 2019-04-14 MED ORDER — TRAMADOL HCL 50 MG PO TABS: 50.0000 mg | ORAL_TABLET | Freq: Two times a day (BID) | ORAL | 1 refills | Status: DC | PRN

## 2019-04-14 NOTE — Telephone Encounter (Signed)
Sent. Thanks.   

## 2019-05-21 ENCOUNTER — Ambulatory Visit (INDEPENDENT_AMBULATORY_CARE_PROVIDER_SITE_OTHER): Payer: Medicare Other | Admitting: Family Medicine

## 2019-05-21 VITALS — BP 122/67 | Wt 177.0 lb

## 2019-05-21 DIAGNOSIS — S39012A Strain of muscle, fascia and tendon of lower back, initial encounter: Secondary | ICD-10-CM | POA: Diagnosis not present

## 2019-05-21 NOTE — Progress Notes (Signed)
I connected with Natasha Woodard on 05/21/19 at 12:00 PM EDT by video and verified that I am speaking with the correct person using two identifiers.   I discussed the limitations, risks, security and privacy concerns of performing an evaluation and management service by video and the availability of in person appointments. I also discussed with the patient that there may be a patient responsible charge related to this service. The patient expressed understanding and agreed to proceed.  Patient location: Home Provider Location: [place] Participants: Lesleigh Noe and Natasha Woodard   Subjective:     Natasha Woodard is a 78 y.o. female presenting for Back Pain (pain started on 05/20/2019-late night time. after sitting a certain way on the chair for about 2 1/2 to 3 hours on 05/19/2019. Lower back pain. Severe pain. Left side mainly. Pain does not radiate. Has taking Advil x 2 and Tramadol-did not really help.)     Back Pain  This is a new problem. The current episode started yesterday. The problem has been gradually worsening since onset. The pain is present in the lumbar spine. The quality of the pain is described as aching. The pain does not radiate. The pain is severe. The symptoms are aggravated by bending and standing. Pertinent negatives include no abdominal pain, bladder incontinence, bowel incontinence, chest pain, fever, leg pain, numbness, paresis, paresthesias, tingling or weakness. Risk factors include menopause. She has tried NSAIDs (tramadol) for the symptoms.   Has tramadol  8-10 years ago had a issue with her back - twisting her back and was prescribed tramadol then, but continued to stay on that medication  Not taking tylenol anymore   Pt declines spinal TTP, pain is to the left of the lower spine  Review of Systems  Constitutional: Negative for fever.  Cardiovascular: Negative for chest pain.  Gastrointestinal: Negative for abdominal pain and bowel  incontinence.  Genitourinary: Negative for bladder incontinence.  Musculoskeletal: Positive for back pain.  Neurological: Negative for tingling, weakness, numbness and paresthesias.     Social History   Tobacco Use  Smoking Status Former Smoker  . Years: 28.00  . Types: Cigarettes  . Last attempt to quit: 12/12/1993  . Years since quitting: 25.4  Smokeless Tobacco Never Used        Objective:   BP Readings from Last 3 Encounters:  05/21/19 122/67  02/05/19 130/70  01/09/19 120/70   Wt Readings from Last 3 Encounters:  05/21/19 177 lb 0.6 oz (80.3 kg)  04/01/19 179 lb (81.2 kg)  02/05/19 185 lb 2 oz (84 kg)    BP 122/67 Comment: per patient  Wt 177 lb 0.6 oz (80.3 kg)   BMI 31.36 kg/m    Physical Exam Constitutional:      Appearance: Normal appearance. She is not ill-appearing.  HENT:     Head: Normocephalic and atraumatic.     Right Ear: External ear normal.     Left Ear: External ear normal.  Eyes:     Conjunctiva/sclera: Conjunctivae normal.  Pulmonary:     Effort: Pulmonary effort is normal. No respiratory distress.  Musculoskeletal:     Comments: Back:  ROM (pt seated): pain with left rotation and with extension, no pain with flexion No symptoms with straight leg raise  Neurological:     Mental Status: She is alert. Mental status is at baseline.  Psychiatric:        Mood and Affect: Mood normal.  Behavior: Behavior normal.        Thought Content: Thought content normal.        Judgment: Judgment normal.            Assessment & Plan:   Problem List Items Addressed This Visit    None    Visit Diagnoses    Strain of lumbar region, initial encounter    -  Primary     Discussed short trial of increased NSAIDs and home exercises. Advised f/u with PCP if Advil/tylenol not working given already on daily tramadol.   Return if symptoms worsen or fail to improve.  Lynnda Child, MD

## 2019-05-21 NOTE — Patient Instructions (Addendum)
Low back strain  Medications - Take up to 600 mg (3 tablets) of Advil every 8 hours  - Can also take tylenol 500 mg every 6 hours  - Continue Tramadol as prescribed  Here is a web-site that has some exercises which you can do in a chair.   [email or web address]  If you are able to watch a video you can also try this:   https://www.kelly.info/?[email or web address]

## 2019-05-22 ENCOUNTER — Telehealth: Payer: Self-pay

## 2019-05-22 ENCOUNTER — Telehealth: Payer: Self-pay | Admitting: Family Medicine

## 2019-05-22 DIAGNOSIS — Z20822 Contact with and (suspected) exposure to covid-19: Secondary | ICD-10-CM

## 2019-05-22 NOTE — Telephone Encounter (Signed)
Patient stated that she was advised to have a COVID test done since she was in close contact with her Sister who has been tested positive.   She would like more information on where she was suppose to have this done and if the order has been placed by our office  Please advise    Patient Phone- [phone]

## 2019-05-22 NOTE — Telephone Encounter (Signed)
Please call this patient. For background information purposes, I am the primary for this patient and also for the patient's sister. I did not disclose any confidential information.  Her sister (who has tested positive for COVID) already told Natasha Woodard about her test results.   Given the contact, I think it makes sense for Natasha Woodard to get tested.  Please set this up. I would appreciate it if she could be put on the phone call follow-up list until we have her test results back. Many thanks.

## 2019-05-22 NOTE — Telephone Encounter (Addendum)
Patient called and advised of the request for covid testing, she verbalized understanding. Appointment scheduled for tomorrow, 05/23/19 at 1500 at [facility], advised of location and to wear a mask for everyone in the vehicle, she verbalized understanding. Order placed.   ----- Message from Diamond Nickel, RN sent at 05/22/2019  3:22 PM EDT ----- Regarding: Schedule for COVID Testing Dr. Damita Dunnings requests order placement and scheduling for COVID testing for the following patient:  Natasha Woodard Female, 78 y.o., 25-Apr-1941 MRN:  [id number]  Insurance:  [place] [employer]  Group: [id number] ID [id number]  Patient is high risk 1, year old with close positive confirmed exposure to COVID.    Please schedule.    Thanks!

## 2019-05-22 NOTE — Telephone Encounter (Signed)
Spoke with patient and let her know that request has been placed for the [facility] to call and schedule testing.   We reviewed quarantine protocol at least until results are in and then we will go from there.  She has been added to the follow up call list for monitoring in the meantime.   Patient knows to wait on call from [facility] for scheduling.

## 2019-05-23 ENCOUNTER — Other Ambulatory Visit: Payer: Medicare Other

## 2019-05-23 DIAGNOSIS — Z20822 Contact with and (suspected) exposure to covid-19: Secondary | ICD-10-CM

## 2019-05-23 DIAGNOSIS — R6889 Other general symptoms and signs: Secondary | ICD-10-CM | POA: Diagnosis not present

## 2019-05-24 ENCOUNTER — Telehealth: Payer: Self-pay

## 2019-05-24 NOTE — Telephone Encounter (Signed)
Called patient to check in on her.  She is doing very well and states that she is not having any symptoms at the present.    We reviewed ongoing quarantine until test results are in and if any breathing difficulty arises over the weekend to please go to the ER.  Patient verbalizes understanding and thanks [place] for the call.

## 2019-05-25 LAB — NOVEL CORONAVIRUS, NAA: SARS-CoV-2, NAA: NOT DETECTED

## 2019-05-26 NOTE — Telephone Encounter (Signed)
Noted.  Thanks. I checked to see if her COVID test was resulted.  According to the report, she is COVID negative.  I am not sure if she is aware.  Please check with patient.  Thanks.

## 2019-05-28 NOTE — Telephone Encounter (Signed)
I did make sure that patient received her negative results.  She is doing great and will be removed from the covid call list but knows to call us back if she develops any concerns.   She thanks [place] for the care and the call.

## 2019-05-29 NOTE — Telephone Encounter (Signed)
Noted, thanks!

## 2019-06-18 ENCOUNTER — Telehealth: Payer: Self-pay | Admitting: *Deleted

## 2019-06-18 NOTE — Telephone Encounter (Signed)
Pt called back and set up appt with Natasha Woodard 7/8

## 2019-06-18 NOTE — Telephone Encounter (Signed)
Patient left a voicemail requesting that Dr. Damita Dunnings send in a script for her because she has been having heartburn and a lot of gas in the last two weeks. Left message for patient to call back because she would need to schedule an appointment. Dr. Damita Dunnings is out of the office this week.

## 2019-06-19 ENCOUNTER — Ambulatory Visit (INDEPENDENT_AMBULATORY_CARE_PROVIDER_SITE_OTHER): Payer: Medicare Other | Admitting: Primary Care

## 2019-06-19 ENCOUNTER — Other Ambulatory Visit: Payer: Self-pay | Admitting: Primary Care

## 2019-06-19 DIAGNOSIS — K219 Gastro-esophageal reflux disease without esophagitis: Secondary | ICD-10-CM

## 2019-06-19 MED ORDER — OMEPRAZOLE 20 MG PO CPDR: 20.0000 mg | DELAYED_RELEASE_CAPSULE | Freq: Every day | ORAL | 0 refills | Status: DC

## 2019-06-19 NOTE — Assessment & Plan Note (Signed)
Symptoms likely from GERD, but given medical history and age we need to be considering a cardiac cause. Given that Tums provides temporary relief we will start with omeprazole 20 mg daily.  If no improvement within a few days, and/or if her symptoms progress we discussed calling her PCP and going to the ED for evaluation.  She appears well, no distress, doesn't appear sickly or uncomfortable.  She will update.

## 2019-06-19 NOTE — Patient Instructions (Signed)
Start omeprazole 20 mg capsules once daily for heartburn. Take these for at least 2 weeks.  Please update myself or Dr. Damita Dunnings if no improvement in symptoms within 3-4 days.  Go to the hospital if you experience left chest pressure that is worse, your breathing gets worse, you start to feel nauseated and/or weak, you break out into sweats.  It was a pleasure meeting you! Allie Bossier, NP-C

## 2019-06-19 NOTE — Progress Notes (Signed)
Subjective:    Patient ID: Natasha Woodard, female    DOB: [birth year], 78 y.o.   MRN: [id number]  HPI  Virtual Visit via Video Note  I connected with Natasha Woodard on 06/19/19 at 11:40 AM EDT by a video enabled telemedicine application and verified that I am speaking with the correct person using two identifiers.  Location: Patient: Home Provider: Office   I discussed the limitations of evaluation and management by telemedicine and the availability of in person appointments. The patient expressed understanding and agreed to proceed.  History of Present Illness:  Natasha Woodard is a 78 year old female with a history of hypertension, CHF, emphysema, sleep apnea, type 2 diabetes who presents today with a chief complaint of gas.  Her discomfort is located to the left lower chest/upper abdomen for which she describes as an intermittent pressure/burning sensation that is more pronounced when laying down. Her symptoms began about one week ago.  She's been taking Tums for her symptoms which provide her temporary improvement. She denies nausea, vomiting, diaphoresis, increased shortness of breath, belching, abdominal pain, constipation. She believes her symptoms are from heartburn.   Observations/Objective:  Alert and oriented. Appears well, not sickly. No distress. Speaking in complete sentences.   Assessment and Plan:  Symptoms likely from GERD, but given medical history and age we need to be considering a cardiac cause. Given that Tums provides temporary relief we will start with omeprazole 20 mg daily.  If no improvement within a few days, and/or if her symptoms progress we discussed calling her PCP and going to the ED for evaluation.  She appears well, no distress, doesn't appear sickly or uncomfortable.  She will update.   Follow Up Instructions:  Start omeprazole 20 mg capsules once daily for heartburn. Take these for at least 2 weeks.  Please update myself or Dr.  Para Woodard if no improvement in symptoms within 3-4 days.  Go to the hospital if you experience left chest pressure that is worse, your breathing gets worse, you start to feel nauseated and/or weak, you break out into sweats.  It was a pleasure meeting you! Natasha ReelKate Denzil Mceachron, NP-C    I discussed the assessment and treatment plan with the patient. The patient was provided an opportunity to ask questions and all were answered. The patient agreed with the plan and demonstrated an understanding of the instructions.   The patient was advised to call back or seek an in-person evaluation if the symptoms worsen or if the condition fails to improve as anticipated.     Doreene NestKatherine K Comer Devins, NP    Review of Systems  Constitutional: Negative for diaphoresis, fatigue and fever.  Respiratory: Negative for shortness of breath.   Gastrointestinal: Negative for abdominal pain, blood in stool, constipation, nausea and vomiting.       Past Medical History:  Diagnosis Date  . Calcific tendonitis 2008   Treatment of left leg  . Cardiomyopathy (HCC) 06/2017   a) Echo: EF 25 and 30%. GR 1 DD. Mild DD with elevated LVEDP. Mild valvular disease.; b) Cardiac MRI 2/'19: frequent PVCs (diffiuclt to interpret) - EF ~27% w/ diffuse HK.  No evidence of infarct, infiltrative Dz or myocarditis. -- ? if related to PVCs.  Mild RV dilation with normal function.; c) f/u Echo 6/'19: EF 35-40%. Gr 1 DD. Diffuse HK. Mod TR. Mild RV dilation.  . Chronic combined systolic and diastolic CHF, NYHA class 2 and ACA/AHA stage C (HCC)   . Coronary artery  disease, non-occlusive    mild-moderate CAD 06/30/17 cath  . CTS (carpal tunnel syndrome)   . Diabetes mellitus    Type II  . Dysrhythmia    patient said that she cant remember what it is  . 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  Nighttime bradycardia suggestive of OSA.  Marland Kitchen Hiatal hernia    with reflux  .  Hyperlipidemia    Statin intolerant  . Hypertension   . Ichthyosis congenita   . NSVD (normal spontaneous vaginal delivery) 1971 & 1972  . Obesity   . Plantar fasciitis    Left  . Pulmonary nodule    Imaged multiple times and benign appearing  . Wears glasses      Social History   Socioeconomic History  . Marital status: Married    Spouse name: Not on file  . Number of children: 2  . Years of education: Not on file  . Highest education level: Not on file  Occupational History    Employer: RETIRED  Social Needs  . Financial resource strain: Not on file  . Food insecurity    Worry: Not on file    Inability: Not on file  . Transportation needs    Medical: Not on file    Non-medical: Not on file  Tobacco Use  . Smoking status: Former Smoker    Years: 28.00    Types: Cigarettes    Quit date: 12/12/1993    Years since quitting: 25.5  . Smokeless tobacco: Never Used  Substance and Sexual Activity  . Alcohol use: No    Alcohol/week: 0.0 standard drinks  . Drug use: No  . Sexual activity: Never  Lifestyle  . Physical activity    Days per week: Not on file    Minutes per session: Not on file  . Stress: Not on file  Relationships  . Social Herbalist on phone: Not on file    Gets together: Not on file    Attends religious service: Not on file    Active member of club or organization: Not on file    Attends meetings of clubs or organizations: Not on file    Relationship status: Not on file  . Intimate partner violence    Fear of current or ex partner: Not on file    Emotionally abused: Not on file    Physically abused: Not on file    Forced sexual activity: Not on file  Other Topics Concern  . Not on file  Social History Narrative   Two kids, two grandchildren, two great grandchildren.    Married 1969   Retired.     Past Surgical History:  Procedure Laterality Date  . ABDOMINAL HYSTERECTOMY  1975   TAH-BSO  . ABSCESS DRAINAGE  1972   right breast   . BREAST BIOPSY Left 01/14/2009   Stereo- Benign  . CARDIAC MRI  01/2018   Difficult to interpret 2/2 PVCs. Normal LV size - EF ~27% with diffuse HK. Mild RV dilation - normal fxn. -- NO MYOCARDIAL LGI => no definitive evidence of prior MI, Infiltrative Dz or Myocarditis -- suspect NICM, possibly related to PVCs.  . CARPAL TUNNEL RELEASE Right 07/02/2014   Procedure: RIGHT CARPAL TUNNEL RELEASE;  Surgeon: Wynonia Sours, MD;  Location: [facility];  Service: Orthopedics;  Laterality: Right;  . CARPAL TUNNEL RELEASE Left 06/11/2015   Procedure: LEFT CARPAL  TUNNEL RELEASE;  Surgeon: Cindee Salt, MD;  Location: [facility];  Service: Orthopedics;  Laterality: Left;  REGIONAL/FAB  . COLONOSCOPY    . corn removal  1968   right  . Holter Monitor  06/2017   ~17,000 PVC beats - majority were singlets, some couplets. 44 brief 3-7 beat runs of NSVT. Also noted were less frequent PACs with 11 runs (longest 15 beats)  . OPEN REDUCTION INTERNAL FIXATION (ORIF) DISTAL RADIAL FRACTURE Left 07/21/2017   Procedure: OPEN REDUCTION INTERNAL FIXATION (ORIF) LEFT DISTAL RADIAL FRACTURE;  Surgeon: Sheral Apley, MD;  Location: [facility];  Service: Orthopedics;  Laterality: Left;  . RIGHT/LEFT HEART CATH AND CORONARY ANGIOGRAPHY N/A 06/30/2017   Procedure: Right/Left Heart Cath and Coronary Angiography;  Surgeon: Marykay Lex, MD;  Location: [facility] INVASIVE CV LAB:  pRCA 55%, pCx 40%, OM1 45%, mCx 50%, D2 50% - LVEF 25-35%. Moderately elevated LVEDP(26 mmHg with PCWP 16 mmHg).  FICK CO/CI: 4.47/2.48. PA pressures 47/14 mmHg with a mean of 27 mm.  Marland Kitchen ROTATOR CUFF REPAIR     left shoulder  . TRANSTHORACIC ECHOCARDIOGRAM  06/2017   EF 25 and 30%. GR 1 DD. Mild diastolic dysfunction with elevated LVEDP. Mild valvular disease.  . TRANSTHORACIC ECHOCARDIOGRAM  11'18, 6/'19    a) EF remains 30-35%.  Diffuse hypokinesis noted.  Severe LA dilation; b) Improved EF 35-40%.  Diffuse HK.  GR 1 DD.  Moderate  TR.  Mild RV dilation.    Family History  Problem Relation Age of Onset  . Heart disease Mother        s/p pacemaker  . Diabetes Mother   . Heart disease Father   . Diabetes Father   . Heart disease Brother        MI  . Cancer Brother        Lung  . Hypertension Other   . Colon cancer Neg Hx   . Breast cancer Neg Hx     Allergies  Allergen Reactions  . Ezetimibe-Simvastatin Other (See Comments)    REACTION: Muscle aches (side effect)  . Lipitor [Atorvastatin] Other (See Comments)    Leg weakness   . Tamiflu [Oseltamivir Phosphate] Other (See Comments)    nightmares  . Pravastatin Sodium Other (See Comments)    REACTION: Muscle aches (side effect)  . Sulfonamide Derivatives Nausea And Vomiting    Current Outpatient Medications on File Prior to Visit  Medication Sig Dispense Refill  . acetaminophen (TYLENOL) 325 MG tablet Take 1-2 tablets (325-650 mg total) by mouth 2 (two) times daily as needed.    Marland Kitchen amiodarone (PACERONE) 200 MG tablet Take 0.5 tablets (100 mg total) by mouth daily. 45 tablet 1  . aspirin 81 MG tablet Take 1 tablet (81 mg total) by mouth daily.    . carvedilol (COREG) 25 MG tablet Take 1 tablet (25 mg total) by mouth 2 (two) times daily. 180 tablet 1  . Cholecalciferol (VITAMIN D) 1000 UNITS capsule Take 1,000 Units by mouth daily.     . furosemide (LASIX) 20 MG tablet Take 20 mg tablet every Monday, Wednesday, Friday, if weight is more than 3 lbs in a day take an extra 20 mg tablet that day as needed. 30 tablet 1  . glucose blood (ACCU-CHEK AVIVA PLUS) test strip USE TO TEST BLOOD SUGAR ONCE DAILY FOR DM E11.9 100 each 1  . Lancets (ACCU-CHEK MULTICLIX) lancets USE ONE LANCET TO TEST BLOOD GLUCOSE DAILY FOR DM 250.00 102 each  3  . lovastatin (MEVACOR) 40 MG tablet Take 1 tablet (40 mg total) by mouth at bedtime. 90 tablet 1  . metFORMIN (GLUCOPHAGE) 500 MG tablet Take 0.5 tablets (250 mg total) by mouth 2 (two) times daily with a meal. 90 tablet 1  .  sacubitril-valsartan (ENTRESTO) 97-103 MG Take 1 tablet by mouth 2 (two) times daily. 180 tablet 1  . spironolactone (ALDACTONE) 25 MG tablet Take 1/2 (one-half) tablet by mouth once daily 45 tablet 1  . traMADol (ULTRAM) 50 MG tablet Take 1 tablet (50 mg total) by mouth every 12 (twelve) hours as needed. 180 tablet 1   No current facility-administered medications on file prior to visit.     There were no vitals taken for this visit.   Objective:   Physical Exam  Constitutional: She is oriented to person, place, and time. She appears well-nourished.  Respiratory: Effort normal. No respiratory distress.  Neurological: She is alert and oriented to person, place, and time.  Psychiatric: She has a normal mood and affect.           Assessment & Plan:

## 2019-06-19 NOTE — Telephone Encounter (Signed)
Patient called stating that she does not understand why the pharmacy is saying that they need a doctors approval on the medication prescribed to her today.

## 2019-07-04 ENCOUNTER — Other Ambulatory Visit: Payer: Self-pay | Admitting: Family Medicine

## 2019-07-10 ENCOUNTER — Encounter: Payer: Self-pay | Admitting: Family Medicine

## 2019-07-29 ENCOUNTER — Ambulatory Visit: Payer: Medicare Other | Admitting: Family Medicine

## 2019-07-29 ENCOUNTER — Ambulatory Visit (HOSPITAL_COMMUNITY)
Admission: RE | Admit: 2019-07-29 | Discharge: 2019-07-29 | Disposition: A | Discharge status: Home / Self Care | Payer: Medicare Other | Source: Ambulatory Visit | Attending: Internal Medicine | Admitting: Internal Medicine

## 2019-07-29 ENCOUNTER — Telehealth (HOSPITAL_COMMUNITY): Payer: Self-pay | Admitting: Pharmacy Technician

## 2019-07-29 ENCOUNTER — Other Ambulatory Visit: Payer: Self-pay

## 2019-07-29 ENCOUNTER — Encounter (HOSPITAL_COMMUNITY): Payer: Self-pay | Admitting: Internal Medicine

## 2019-07-29 VITALS — BP 132/78 | HR 64 | Wt 178.2 lb

## 2019-07-29 DIAGNOSIS — Z87891 Personal history of nicotine dependence: Secondary | ICD-10-CM | POA: Diagnosis not present

## 2019-07-29 DIAGNOSIS — Z8249 Family history of ischemic heart disease and other diseases of the circulatory system: Secondary | ICD-10-CM | POA: Insufficient documentation

## 2019-07-29 DIAGNOSIS — I5042 Chronic combined systolic (congestive) and diastolic (congestive) heart failure: Secondary | ICD-10-CM | POA: Insufficient documentation

## 2019-07-29 DIAGNOSIS — I5022 Chronic systolic (congestive) heart failure: Secondary | ICD-10-CM

## 2019-07-29 DIAGNOSIS — Z7982 Long term (current) use of aspirin: Secondary | ICD-10-CM | POA: Diagnosis not present

## 2019-07-29 DIAGNOSIS — G4733 Obstructive sleep apnea (adult) (pediatric): Secondary | ICD-10-CM | POA: Insufficient documentation

## 2019-07-29 DIAGNOSIS — I428 Other cardiomyopathies: Secondary | ICD-10-CM | POA: Insufficient documentation

## 2019-07-29 DIAGNOSIS — Z79899 Other long term (current) drug therapy: Secondary | ICD-10-CM | POA: Insufficient documentation

## 2019-07-29 DIAGNOSIS — R911 Solitary pulmonary nodule: Secondary | ICD-10-CM | POA: Diagnosis not present

## 2019-07-29 DIAGNOSIS — I110 Hypertensive heart disease with heart failure: Secondary | ICD-10-CM | POA: Diagnosis not present

## 2019-07-29 DIAGNOSIS — E119 Type 2 diabetes mellitus without complications: Secondary | ICD-10-CM | POA: Diagnosis not present

## 2019-07-29 DIAGNOSIS — R0602 Shortness of breath: Secondary | ICD-10-CM | POA: Insufficient documentation

## 2019-07-29 DIAGNOSIS — I10 Essential (primary) hypertension: Secondary | ICD-10-CM

## 2019-07-29 DIAGNOSIS — I472 Ventricular tachycardia: Secondary | ICD-10-CM | POA: Diagnosis not present

## 2019-07-29 DIAGNOSIS — Z7984 Long term (current) use of oral hypoglycemic drugs: Secondary | ICD-10-CM | POA: Insufficient documentation

## 2019-07-29 DIAGNOSIS — I493 Ventricular premature depolarization: Secondary | ICD-10-CM

## 2019-07-29 DIAGNOSIS — K449 Diaphragmatic hernia without obstruction or gangrene: Secondary | ICD-10-CM | POA: Insufficient documentation

## 2019-07-29 DIAGNOSIS — E785 Hyperlipidemia, unspecified: Secondary | ICD-10-CM | POA: Diagnosis not present

## 2019-07-29 DIAGNOSIS — G5600 Carpal tunnel syndrome, unspecified upper limb: Secondary | ICD-10-CM | POA: Diagnosis not present

## 2019-07-29 DIAGNOSIS — I2510 Atherosclerotic heart disease of native coronary artery without angina pectoris: Secondary | ICD-10-CM | POA: Diagnosis not present

## 2019-07-29 MED ORDER — JARDIANCE 10 MG PO TABS: 10.0000 mg | ORAL_TABLET | Freq: Every day | ORAL | 5 refills | Status: DC

## 2019-07-29 NOTE — Patient Instructions (Signed)
STOP Lasix  START Jardiance 10mg (1 tab) once daily   Your physician recommends that you schedule a follow-up appointment in: 9 months. We will contact you to schedule your next appointment.   At the [facility], you and your health needs are our priority. As part of our continuing mission to provide you with exceptional heart care, we have created designated Provider Care Teams. These Care Teams include your primary Cardiologist (physician) and Advanced Practice Providers (APPs- Physician Assistants and Nurse Practitioners) who all work together to provide you with the care you need, when you need it.   You may see any of the following providers on your designated Care Team at your next follow up: Marland Kitchen Dr Glori Bickers . Dr Loralie Champagne . Darrick Grinder, NP   Please be sure to bring in all your medications bottles to every appointment.

## 2019-07-29 NOTE — Telephone Encounter (Signed)
Met patient in complete an application for [employer] Patient Assistance [employer] (NPAF) in an effort to reduce the patient's out of pocket expense for Entresto to $0. Patients co-pay is currently $144.11 without PAN funding.  Application completed and faxed to [phone].   NPAF phone number for follow up is [phone].     Patient also needed help with Jardiance. Filling out application for [employer] to keep her out of pocket expense to $0. Patient was given a free month trial card and without that insurance would require her to pay $138.22.  Application will be faxed to [phone] upon receiving patient signature.   This encounter will be updated until final determination.   Charlann Boxer, CPhT

## 2019-07-29 NOTE — Progress Notes (Signed)
ADVANCED HF CLINIC NOTE  Referring Physician: Herbie Woodard  Primary Care: Natasha Woodard Primary Cardiologist: Natasha Woodard  HPI:  Natasha Woodard is a 78 y.o. female with a h/o HL, HTN, DM2 and systolic HF due to NICM referred by Dr. Herbie Woodard for further evaluation of her HF.   Natasha Woodard was initially evaluated in the summer 2018 and was noted to have frequent PVCs on EKG with a low resting heart rate. Holter showed 13% PVCs. Echo showed an EF of 20-30% and a Holter monitor which revealed significant amount of ventricular ectopy. She was also referred for right and left heart catheterization back in July that showed minimal CAD with EF 25-30%, LVEDP 26 mmHg with PCWP of only 16 mHg).  She has been treated medically. F/u echo 10/26/2017  2 D Echo 10/26/2017: EF remains 30-35%.  Diffuse hypokinesis noted.  Severe LA dilation. RV normal  She returns today for regular follow up. In 11/19 was having more PVCs on ECG so we placed ZioPatch. Results as below < 1% PVC. Echo 1/20 EF 50-55. Feels pretty doing water aerobics 3 days per week. [facility]. Reports SOB going up steps which is chronic for her. No orthopnea, PND or edema   Echo 1/20 EF 55% Personally reviewed  Echo 6/19 EF 35-40%  CMRI: 01/17/18 IMPRESSION: 1.  Difficult study due to frequent PVCs. 2.  Normal LV size with EF 27%, diffuse hypokinesis 3.  Mildly dilated RV with normal systolic function 4. No myocardial LGE, so no definitive evidence for prior MI, infiltrative disease, or myocarditis. Suspect nonischemic cardiomyopathy, possibly due to PVCs.  Event monitor 01/20/18 Normal sinus rhythm with frequent multifocal PVCs (9%) and PACs. Nighttime bradycardia suggestive of OSA.   ZioPatch 11/19  1. Predominant underlying rhythm was Sinus Rhythm. Min HR of 46 bpm, max HR of 143 bpm, and avg HR of 62 bpm. 2. Two brief runs NSVT the longest lasting 4 beats with an avg rate of 110 bpm. 3. Occasional PVCs (< 1.0%)     Past Medical History:  Diagnosis Date  . Calcific tendonitis 2008   Treatment of left leg  . Cardiomyopathy (HCC) 06/2017   a) Echo: EF 25 and 30%. GR 1 DD. Mild DD with elevated LVEDP. Mild valvular disease.; b) Cardiac MRI 2/'19: frequent PVCs (diffiuclt to interpret) - EF ~27% w/ diffuse HK.  No evidence of infarct, infiltrative Dz or myocarditis. -- ? if related to PVCs.  Mild RV dilation with normal function.; c) f/u Echo 6/'19: EF 35-40%. Gr 1 DD. Diffuse HK. Mod TR. Mild RV dilation.  . Chronic combined systolic and diastolic CHF, NYHA class 2 and ACA/AHA stage C (HCC)   . Coronary artery disease, non-occlusive    mild-moderate CAD 06/30/17 cath  . CTS (carpal tunnel syndrome)   . Diabetes mellitus    Type II  . Dysrhythmia    patient said that she cant remember what it is  . 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  Nighttime bradycardia suggestive of OSA.  Marland Kitchen Hiatal hernia    with reflux  . Hyperlipidemia    Statin intolerant  . Hypertension   . Ichthyosis congenita   . NSVD (normal spontaneous vaginal delivery) 1971 & 1972  . Obesity   . Plantar fasciitis    Left  . Pulmonary nodule    Imaged multiple times and benign appearing  . Wears  glasses     Current Outpatient Medications  Medication Sig Dispense Refill  . amiodarone (PACERONE) 200 MG tablet Take 0.5 tablets (100 mg total) by mouth daily. 45 tablet 1  . aspirin 81 MG tablet Take 1 tablet (81 mg total) by mouth daily.    . carvedilol (COREG) 25 MG tablet Take 1 tablet (25 mg total) by mouth 2 (two) times daily. 180 tablet 1  . Cholecalciferol (VITAMIN D) 1000 UNITS capsule Take 1,000 Units by mouth daily.     . furosemide (LASIX) 20 MG tablet TAKE 20MG  TABLET EVERY MONDAY, WEDNESDAY, AND FRIDAY. IF WEIGHT IS MORE THAN 3LBS IN A DAY, TAKE AN EXTRA 20MG  THAT DAY AS NEEDED 30 tablet 1  . glucose blood (ACCU-CHEK AVIVA PLUS) test strip  USE TO TEST BLOOD SUGAR ONCE DAILY FOR DM E11.9 100 each 1  . Lancets (ACCU-CHEK MULTICLIX) lancets USE ONE LANCET TO TEST BLOOD GLUCOSE DAILY FOR DM 250.00 102 each 3  . lovastatin (MEVACOR) 40 MG tablet Take 1 tablet (40 mg total) by mouth at bedtime. 90 tablet 1  . metFORMIN (GLUCOPHAGE) 500 MG tablet Take 0.5 tablets (250 mg total) by mouth 2 (two) times daily with a meal. 90 tablet 1  . sacubitril-valsartan (ENTRESTO) 97-103 MG Take 1 tablet by mouth 2 (two) times daily. 180 tablet 1  . spironolactone (ALDACTONE) 25 MG tablet Take 1/2 (one-half) tablet by mouth once daily 45 tablet 1  . traMADol (ULTRAM) 50 MG tablet Take 1 tablet (50 mg total) by mouth every 12 (twelve) hours as needed. 180 tablet 1   No current facility-administered medications for this encounter.     Allergies  Allergen Reactions  . Ezetimibe-Simvastatin Other (See Comments)    REACTION: Muscle aches (side effect)  . Lipitor [Atorvastatin] Other (See Comments)    Leg weakness   . Tamiflu [Oseltamivir Phosphate] Other (See Comments)    nightmares  . Pravastatin Sodium Other (See Comments)    REACTION: Muscle aches (side effect)  . Sulfonamide Derivatives Nausea And Vomiting      Social History   Socioeconomic History  . Marital status: Married    Spouse name: Not on file  . Number of children: 2  . Years of education: Not on file  . Highest education level: Not on file  Occupational History    Employer: RETIRED  Social Needs  . Financial resource strain: Not on file  . Food insecurity    Worry: Not on file    Inability: Not on file  . Transportation needs    Medical: Not on file    Non-medical: Not on file  Tobacco Use  . Smoking status: Former Smoker    Years: 28.00    Types: Cigarettes    Quit date: 12/12/1993    Years since quitting: 25.6  . Smokeless tobacco: Never Used  Substance and Sexual Activity  . Alcohol use: No    Alcohol/week: 0.0 standard drinks  . Drug use: No  . Sexual  activity: Never  Lifestyle  . Physical activity    Days per week: Not on file    Minutes per session: Not on file  . Stress: Not on file  Relationships  . Social Herbalist on phone: Not on file    Gets together: Not on file    Attends religious service: Not on file    Active member of club or organization: Not on file    Attends meetings of clubs or organizations: Not  on file    Relationship status: Not on file  . Intimate partner violence    Fear of current or ex partner: Not on file    Emotionally abused: Not on file    Physically abused: Not on file    Forced sexual activity: Not on file  Other Topics Concern  . Not on file  Social History Narrative   Two kids, two grandchildren, two great grandchildren.    Married 1969   Retired.       Family History  Problem Relation Age of Onset  . Heart disease Mother        s/p pacemaker  . Diabetes Mother   . Heart disease Father   . Diabetes Father   . Heart disease Brother        MI  . Cancer Brother        Lung  . Hypertension Other   . Colon cancer Neg Hx   . Breast cancer Neg Hx     Vitals:   07/29/19 1057  BP: 132/78  Pulse: 64  SpO2: 95%  Weight: 80.8 kg (178 lb 3.2 oz)   Filed Weights   07/29/19 1057  Weight: 80.8 kg (178 lb 3.2 oz)    PHYSICAL EXAM: General:  Well appearing. No resp difficulty HEENT: normal Neck: supple. no JVD. Carotids 2+ bilat; no bruits. No lymphadenopathy or thryomegaly appreciated. Cor: PMI nondisplaced. Regular rate & rhythm. No rubs, gallops or murmurs. Lungs: clear Abdomen: obese soft, nontender, nondistended. No hepatosplenomegaly. No bruits or masses. Good bowel sounds. Extremities: no cyanosis, clubbing, rash, edema Neuro: alert & orientedx3, cranial nerves grossly intact. moves all 4 extremities w/o difficulty. Affect pleasant    ASSESSMENT & PLAN:  1. Chronic systolic HF - due to NICM. Cath 7/18 with minimal CAD. Initial EF 20-25%. Etiology ? Frequent  PVCs - Echo 11/18 EF 30-35%. - cMRI EF 27% No LGE. 2/19 - Echo 6/19 EF ~35-40%  - Echo 1/20  EF 50-55% - complete recovery of LV function with PVC suppression  Personally reviewed - Stable NYHA II-III. Recommended weight loss and more walking. - Volume status looks good.  - Holter monitor 2/19 9% PVCs  (Previous holter monitor 7/18 with 13% PVCs) - ZioPatch 11/19 <1% PVC - Continue entresto 97/103 - Continue carvedilol 18.75 bid - Continue spiro 12.5 daily. Potassium runs about 5.0 will not titrate.  - Stop lasix with addition of Jardiance   2. Frequent PVCs - Initial monitor with 13% PVCs 7/18. ? Etiology of NICM - F/u holter monitor 2/19 9% PVCs  -- ZioPatch 11/19 <1% PVC - Continue amio 100 daily - Had sleep study in November with Dr. Maple HudsonYoung. Very mild OSA (5.9/hr) told she doesn't need CPAP - Suggested trying to lose 15-20 pounds  3. HTN - Blood pressure well controlled. Continue current regimen.  4. DM2 - On metformin - Will add Jardiance  Arvilla Meresaniel Bensimhon, MD  11:26 AM

## 2019-07-29 NOTE — Addendum Note (Signed)
Encounter addended by: Shonna Chock, CMA on: 0/35/0093 11:44 AM  Actions taken: Medication long-term status modified, Order list changed, Clinical Note Signed

## 2019-07-30 ENCOUNTER — Encounter (HOSPITAL_COMMUNITY): Payer: Self-pay | Admitting: *Deleted

## 2019-08-05 ENCOUNTER — Encounter: Payer: Self-pay | Admitting: Family Medicine

## 2019-08-05 ENCOUNTER — Other Ambulatory Visit: Payer: Self-pay

## 2019-08-05 ENCOUNTER — Ambulatory Visit (INDEPENDENT_AMBULATORY_CARE_PROVIDER_SITE_OTHER): Payer: Medicare Other | Admitting: Family Medicine

## 2019-08-05 VITALS — BP 122/68 | HR 60 | Temp 97.3°F | Ht 63.0 in | Wt 176.2 lb

## 2019-08-05 DIAGNOSIS — E119 Type 2 diabetes mellitus without complications: Secondary | ICD-10-CM | POA: Diagnosis not present

## 2019-08-05 DIAGNOSIS — Z23 Encounter for immunization: Secondary | ICD-10-CM | POA: Diagnosis not present

## 2019-08-05 LAB — POCT GLYCOSYLATED HEMOGLOBIN (HGB A1C): Hemoglobin A1C: 6.2 % — AB (ref 4.0–5.6)

## 2019-08-05 NOTE — Progress Notes (Signed)
Diabetes:  Using medications without difficulties: yes Hypoglycemic episodes: can have sx with sugar down to the 80s.   Hyperglycemic episodes: no Feet problems: no Blood Sugars averaging: ~110 eye exam within last year: no A1c d/w pt.  Improved from prev.  Jardiance not yet started.    We talked about her renal function with prev stable Cr.  I would not expect her to be in her late 44s with diabetes and other medical problems and still have the same kidney function she had when she was in her 31s or 41s.  Discussed.  We can still monitor her kidney function, as we have been doing.  She is going to water aerobics but still getting SOB with stairs at home.   Meds, vitals, and allergies reviewed.  ROS: Per HPI unless specifically indicated in ROS section   GEN: nad, alert and oriented HEENT ncat NECK: supple w/o LA CV: rrr. PULM: ctab, no inc wob ABD: soft, +bs EXT: no edema SKIN: chronic changes noted.

## 2019-08-05 NOTE — Patient Instructions (Addendum)
Let me update Dr. Haroldine Laws about your A1c. Don't start jardiance yet.  Don't change your meds for now.  Your kidney function has been stable.   Your A1c is better/good.   Flu shot today.

## 2019-08-05 NOTE — Telephone Encounter (Signed)
Received notification from [place] for [place]. At this time she is not eligible to receive Jardiance through [facility]. The program suggests that she may be eligible for medication through [place] and should reach out to [place] at [phone] to determine eligibility. They are sending her a one time supply of Jardiance while she waits for approval from [employer].   If she is denied eligibility through [facility], either her or the office would need to provide a copy of the denial letter and at that time they would re-consider her enrollment in the [facility] patient to let her know about this, had to leave a voicemail. Will follow up.  Charlann Boxer, CPhT

## 2019-08-07 NOTE — Assessment & Plan Note (Signed)
Several issues to consider A1c d/w pt.  Improved from prev.  Jardiance not yet started.    We talked about her renal function with prev stable Cr.  I would not expect her to be in her late 83s with diabetes and other medical problems and still have the same kidney function she had when she was in her 5s or 71s.  Discussed.  We can still monitor her kidney function, as we have been doing.  She is going to water aerobics but still getting SOB with stairs at home.   I get the point about starting Jardiance but I do not want her to have low sugars.  I do not know how much her improvement in A1c would affect the decision to start Jardiance.  I need cardiology input on this.  I will talk with Dr. Haroldine Laws about this.  I greatly appreciate the help of all involved, especially the cardiac clinic.  She has not yet started Smiley.    We did not change her meds at this point. >25 minutes spent in face to face time with patient, >50% spent in counselling or coordination of care.

## 2019-08-08 NOTE — Telephone Encounter (Signed)
Patient was approved to receive Entresto through [employer].  Effective Dates: 08/01/2019 through 12/12/2019.   ID: [id number]  First shipment was delivered 08/07/2019.  Spoke with patient about Vania Rea and she stated that her PCP had concerns with her starting on Jardiance and that he would reach out to the Dr. Haroldine Laws. She was instructed by her PCP to not start Jardiance at this time. Tried to get patient to speak with triage about this, she declined since her PCP is supposed to call her after talking with her provider here.   May call back to get the number to Social security if she has a need in the future.  Charlann Boxer, CPhT

## 2019-08-09 ENCOUNTER — Other Ambulatory Visit: Payer: Self-pay | Admitting: *Deleted

## 2019-08-09 ENCOUNTER — Telehealth: Payer: Self-pay | Admitting: Family Medicine

## 2019-08-09 MED ORDER — ACCU-CHEK MULTICLIX LANCETS MISC: 3 refills | Status: DC

## 2019-08-09 MED ORDER — ACCU-CHEK AVIVA PLUS VI STRP: ORAL_STRIP | 1 refills | Status: DC

## 2019-08-09 NOTE — Telephone Encounter (Signed)
Best number [phone]  Pt called needing to get a refill on test strips and lancets for accu check aviva  Test strips case says accu  check aviva plus  [facility] [address]  pharamcy told pt to call us  Pt is completely out

## 2019-08-09 NOTE — Telephone Encounter (Signed)
Rx sent 

## 2019-08-16 ENCOUNTER — Telehealth: Payer: Self-pay | Admitting: Family Medicine

## 2019-08-16 NOTE — Telephone Encounter (Signed)
Left detailed message on voicemail. DPR 

## 2019-08-16 NOTE — Telephone Encounter (Signed)
Please let patient know that I talked to Dr. Haroldine Laws in the meantime about Natasha Woodard and it is reasonable to try along with her baseline medications.  The risk of low sugar using this medication should be low.  Update me as needed about her sugar in the meantime.  Thanks.

## 2019-08-20 ENCOUNTER — Telehealth: Payer: Self-pay

## 2019-08-20 NOTE — Telephone Encounter (Signed)
It appears that it was sent in as a refill back in April.  In the future, I would like this to come through cardiology.  Please have pharmacy contact cards if needed for refills.  Thanks.

## 2019-08-20 NOTE — Telephone Encounter (Signed)
Patient advised.

## 2019-08-20 NOTE — Telephone Encounter (Signed)
Pt left v/m wanting to know why Dr Josefine Class name is on her amiodarone prescription bottle. Pt thinks Dr Haroldine Laws and Dr Ellyn Hack were prescribing this med for pt. Pt request cb with why it was changed to Dr Damita Dunnings. On hx med list last prescribed amiodarone 200 mg taking 1/2 tab po daily. Last filled by Dr Glenetta Hew # 45 x 3 on 10/03/18. On current med list amiodarone 200 mg taking 1/2 tab po daily # 45 x 1 was refilled on 04/10/19 by Larene Beach CMA.Please advise. Please see 04/09/19 refill note about Dr Damita Dunnings wanting card to refill amiodarone.

## 2019-08-22 ENCOUNTER — Telehealth (HOSPITAL_COMMUNITY): Payer: Self-pay

## 2019-08-22 NOTE — Telephone Encounter (Signed)
Pt called asking for clarification of her medication dose of Amiodarone.  Pt referring to AVS from 01/2018 that states that she is in 200mg  daily however her pill bottle states 100mg  daily.  Advised that her dose has since changed and she should not refer to the AVS from 01/2018 as she have been seen several times after with med changes.  Pt was last seen 07/29/19 and md note states that patient was on 100mg  daily and should continue with that dose.  Reiterated this information to patient. Advised I will mail her AVS tomorrow when in office that reflects her current dose.  Pt verbalized appreciation.

## 2019-08-23 ENCOUNTER — Telehealth: Payer: Self-pay | Admitting: Family Medicine

## 2019-08-23 ENCOUNTER — Telehealth: Payer: Self-pay | Admitting: *Deleted

## 2019-08-23 DIAGNOSIS — K219 Gastro-esophageal reflux disease without esophagitis: Secondary | ICD-10-CM

## 2019-08-23 NOTE — Telephone Encounter (Signed)
This medication is not on her current meds list, please advise. 

## 2019-08-23 NOTE — Telephone Encounter (Signed)
This medication is not on her current meds list, please advise.

## 2019-08-23 NOTE — Telephone Encounter (Signed)
Patient had a visit on 7/8 with K.Clark for heart burn.  She was prescribed Omeprazole 20mg  to take for this. Patient called today requesting a refill on this medication    [place]    Patient would like a call once this is sent to the pharmacy  C/B #  [phone]

## 2019-08-23 NOTE — Telephone Encounter (Signed)
AVS mailed as requested by patient with current med list.

## 2019-08-25 MED ORDER — OMEPRAZOLE 20 MG PO CPDR: 20.0000 mg | DELAYED_RELEASE_CAPSULE | Freq: Every day | ORAL | 5 refills | Status: DC

## 2019-08-25 NOTE — Telephone Encounter (Signed)
Sent. Thanks.   

## 2019-09-23 ENCOUNTER — Encounter: Payer: Self-pay | Admitting: Family Medicine

## 2019-09-23 ENCOUNTER — Other Ambulatory Visit: Payer: Self-pay

## 2019-09-23 ENCOUNTER — Ambulatory Visit (INDEPENDENT_AMBULATORY_CARE_PROVIDER_SITE_OTHER): Payer: Medicare Other | Admitting: Family Medicine

## 2019-09-23 DIAGNOSIS — B369 Superficial mycosis, unspecified: Secondary | ICD-10-CM | POA: Diagnosis not present

## 2019-09-23 DIAGNOSIS — N183 Chronic kidney disease, stage 3 unspecified: Secondary | ICD-10-CM

## 2019-09-23 MED ORDER — NYSTATIN 100000 UNIT/GM EX POWD: Freq: Two times a day (BID) | CUTANEOUS | 1 refills | Status: DC

## 2019-09-23 NOTE — Progress Notes (Signed)
Rash under B breasts.  Going on for about 6 months per patient report.  Now with vaginal irritation in the last 3 weeks.  She used OTC vaginal cream w/ partial relief.  No discharge.  Some itching but not burning and no bleeding.  She is started Jardiance in the meantime.  She was asking about chronic kidney disease.  Her creatinine has been stable and it would be expected for her to have a decrease in GFR relative to the value she had when she was in her 20s or 30s or even 40s.  We talked about trying to maintain adequate control of her blood pressure and sugar and monitoring her labs periodically to make sure she did not have dramatic changes in GFR and then addressing that as needed.  While she does have chronic kidney disease based on her GFR she does not have any emergent findings on previous labs.  All questions answered.  Meds, vitals, and allergies reviewed.   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 Chaperoned exam with B breast fungal changes.  rrr Ctab Vaginal exam deferred.

## 2019-09-23 NOTE — Patient Instructions (Signed)
Stop jardiance for now.  Use monistat as needed for vaginal irritation.   Use nystatin under your breasts.   Update me as needed.  Take care.  Glad to see you.

## 2019-09-25 DIAGNOSIS — N183 Chronic kidney disease, stage 3 unspecified: Secondary | ICD-10-CM | POA: Insufficient documentation

## 2019-09-25 NOTE — Assessment & Plan Note (Addendum)
Discussed with patient.  Chaperoned exam.  Start nystatin for the changes underneath the breast.  No ulceration or fluctuant masses noted.  She likely has fungal changes related to increased sugar excretion now that she is on Jardiance.  Stop Jardiance in the meantime and update me as needed.  See after visit summary.  She agrees.  Rationale discussed with patient.  She can also use Monistat as needed.

## 2019-09-25 NOTE — Assessment & Plan Note (Signed)
She was asking about chronic kidney disease.  Her creatinine has been stable and it would be expected for her to have a decrease in GFR relative to the value she had when she was in her 20s or 30s or even 40s.  We talked about trying to maintain adequate control of her blood pressure and sugar and monitoring her labs periodically to make sure she did not have dramatic changes in GFR and then addressing that as needed.  While she does have chronic kidney disease based on her GFR she does not have any emergent findings on previous labs.  All questions answered.

## 2019-09-29 ENCOUNTER — Encounter: Payer: Self-pay | Admitting: Family Medicine

## 2019-09-30 ENCOUNTER — Other Ambulatory Visit: Payer: Self-pay

## 2019-09-30 DIAGNOSIS — Z20822 Contact with and (suspected) exposure to covid-19: Secondary | ICD-10-CM

## 2019-10-02 ENCOUNTER — Telehealth: Payer: Self-pay

## 2019-10-02 ENCOUNTER — Other Ambulatory Visit: Payer: Self-pay | Admitting: Family Medicine

## 2019-10-02 LAB — NOVEL CORONAVIRUS, NAA: SARS-CoV-2, NAA: DETECTED — AB

## 2019-10-02 MED ORDER — JARDIANCE 10 MG PO TABS: 5.0000 mg | ORAL_TABLET | Freq: Every day | ORAL | Status: DC

## 2019-10-02 NOTE — Telephone Encounter (Signed)
Please get update on patient.  I just saw the lab report.  Her Covid test is positive.  Please give her routine quarantine instructions.  Please see what symptoms she currently has.  Please find out when her symptoms started.  Please add her to the follow-up list.  Thanks.

## 2019-10-02 NOTE — Telephone Encounter (Signed)
Noted. Thanks. Agreed.  

## 2019-10-02 NOTE — Telephone Encounter (Signed)
Pt left v/m requesting cb with results of covid test. Per chart lab tab covid results on 10/02/19 were detected.

## 2019-10-02 NOTE — Telephone Encounter (Signed)
pts symptoms started around 09/26/19 after her husband returned from visiting his brother; pts husband was diagnosed with + covid on 09/28/19. Pt has a non prod cough that is dry; pt is not coughing a lot since started taking OTC cough suppressant. Pt has "tiny and infrequent" H/A; last H/A was 09/30/19 and lasted only few minutes. Pt has runny nose and pt sniffs it back. No other covid symptoms at this time; and pt understands to call [facility] with any questions or concerns. Pt notified and voiced understanding about quarantine instructions. Note on Lugene's desk to add to covid follow up list.ED precautions given and pt voiced understanding.

## 2019-10-03 ENCOUNTER — Ambulatory Visit: Payer: Medicare Other | Admitting: Cardiology

## 2019-10-03 ENCOUNTER — Telehealth: Payer: Self-pay | Admitting: Family Medicine

## 2019-10-03 MED ORDER — DOXYCYCLINE HYCLATE 100 MG PO TABS: 100.0000 mg | ORAL_TABLET | Freq: Two times a day (BID) | ORAL | 0 refills | Status: DC

## 2019-10-03 NOTE — Telephone Encounter (Signed)
Pt calling with c/o worsening cough with mucous production. Reports mucous being yellow to cream in color.  Denies SOB, chest tightness or CP. No active fever.  Diagnosed with Covid-19, tested on Monday 10/19 at [facility] testing site.  Pt requesting an abx be called into [facility] in [place].   Allergies  Allergen Reactions  . Ezetimibe-Simvastatin Other (See Comments)    REACTION: Muscle aches (side effect)  . Lipitor [Atorvastatin] Other (See Comments)    Leg weakness   . Tamiflu [Oseltamivir Phosphate] Other (See Comments)    nightmares  . Pravastatin Sodium Other (See Comments)    REACTION: Muscle aches (side effect)  . Sulfonamide Derivatives Nausea And Vomiting    please advise Dr Damita Dunnings, thanks.

## 2019-10-03 NOTE — Telephone Encounter (Signed)
Given her situation, would preemptively start doxycycline.  Have her update [place] tomorrow about her condition.  Give her ER cautions- to ER if SOB, CP, etc.  Thanks.

## 2019-10-03 NOTE — Telephone Encounter (Signed)
Patient advised.

## 2019-10-10 ENCOUNTER — Other Ambulatory Visit: Payer: Self-pay

## 2019-10-10 ENCOUNTER — Emergency Department (HOSPITAL_COMMUNITY)
Admission: EM | Admit: 2019-10-10 | Discharge: 2019-10-10 | Disposition: A | Discharge status: Home / Self Care | Payer: Medicare Other | Attending: Emergency Medicine | Admitting: Emergency Medicine

## 2019-10-10 ENCOUNTER — Ambulatory Visit: Payer: Medicare Other | Admitting: Family Medicine

## 2019-10-10 ENCOUNTER — Emergency Department (HOSPITAL_COMMUNITY): Payer: Medicare Other

## 2019-10-10 ENCOUNTER — Telehealth: Payer: Self-pay

## 2019-10-10 DIAGNOSIS — N183 Chronic kidney disease, stage 3 unspecified: Secondary | ICD-10-CM | POA: Insufficient documentation

## 2019-10-10 DIAGNOSIS — I5042 Chronic combined systolic (congestive) and diastolic (congestive) heart failure: Secondary | ICD-10-CM | POA: Insufficient documentation

## 2019-10-10 DIAGNOSIS — R0602 Shortness of breath: Secondary | ICD-10-CM

## 2019-10-10 DIAGNOSIS — I259 Chronic ischemic heart disease, unspecified: Secondary | ICD-10-CM | POA: Diagnosis not present

## 2019-10-10 DIAGNOSIS — R4182 Altered mental status, unspecified: Secondary | ICD-10-CM | POA: Diagnosis not present

## 2019-10-10 DIAGNOSIS — E1122 Type 2 diabetes mellitus with diabetic chronic kidney disease: Secondary | ICD-10-CM | POA: Insufficient documentation

## 2019-10-10 DIAGNOSIS — Z7982 Long term (current) use of aspirin: Secondary | ICD-10-CM | POA: Diagnosis not present

## 2019-10-10 DIAGNOSIS — U071 COVID-19: Secondary | ICD-10-CM | POA: Insufficient documentation

## 2019-10-10 DIAGNOSIS — Z7984 Long term (current) use of oral hypoglycemic drugs: Secondary | ICD-10-CM | POA: Diagnosis not present

## 2019-10-10 DIAGNOSIS — Z87891 Personal history of nicotine dependence: Secondary | ICD-10-CM | POA: Insufficient documentation

## 2019-10-10 DIAGNOSIS — Z79899 Other long term (current) drug therapy: Secondary | ICD-10-CM | POA: Insufficient documentation

## 2019-10-10 DIAGNOSIS — R05 Cough: Secondary | ICD-10-CM | POA: Diagnosis not present

## 2019-10-10 DIAGNOSIS — I130 Hypertensive heart and chronic kidney disease with heart failure and stage 1 through stage 4 chronic kidney disease, or unspecified chronic kidney disease: Secondary | ICD-10-CM | POA: Insufficient documentation

## 2019-10-10 LAB — COMPREHENSIVE METABOLIC PANEL
ALT: 17 U/L (ref 0–44)
AST: 22 U/L (ref 15–41)
Albumin: 3 g/dL — ABNORMAL LOW (ref 3.5–5.0)
Alkaline Phosphatase: 51 U/L (ref 38–126)
Anion gap: 9 (ref 5–15)
BUN: 10 mg/dL (ref 8–23)
CO2: 24 mmol/L (ref 22–32)
Calcium: 9 mg/dL (ref 8.9–10.3)
Chloride: 106 mmol/L (ref 98–111)
Creatinine, Ser: 1.27 mg/dL — ABNORMAL HIGH (ref 0.44–1.00)
GFR calc Af Amer: 46 mL/min — ABNORMAL LOW (ref >60)
GFR calc non Af Amer: 40 mL/min — ABNORMAL LOW (ref >60)
Glucose, Bld: 107 mg/dL — ABNORMAL HIGH (ref 70–99)
Potassium: 4.4 mmol/L (ref 3.5–5.1)
Sodium: 139 mmol/L (ref 135–145)
Total Bilirubin: 0.6 mg/dL (ref 0.3–1.2)
Total Protein: 6.4 g/dL — ABNORMAL LOW (ref 6.5–8.1)

## 2019-10-10 LAB — CBC WITH DIFFERENTIAL/PLATELET
Abs Immature Granulocytes: 0.02 10*3/uL (ref 0.00–0.07)
Basophils Absolute: 0 10*3/uL (ref 0.0–0.1)
Basophils Relative: 0 %
Eosinophils Absolute: 0 10*3/uL (ref 0.0–0.5)
Eosinophils Relative: 1 %
HCT: 41.3 % (ref 36.0–46.0)
Hemoglobin: 12.9 g/dL (ref 12.0–15.0)
Immature Granulocytes: 1 %
Lymphocytes Relative: 29 %
Lymphs Abs: 1.1 10*3/uL (ref 0.7–4.0)
MCH: 28.7 pg (ref 26.0–34.0)
MCHC: 31.2 g/dL (ref 30.0–36.0)
MCV: 92 fL (ref 80.0–100.0)
Monocytes Absolute: 0.5 10*3/uL (ref 0.1–1.0)
Monocytes Relative: 15 %
Neutro Abs: 2 10*3/uL (ref 1.7–7.7)
Neutrophils Relative %: 54 %
Platelets: 302 10*3/uL (ref 150–400)
RBC: 4.49 MIL/uL (ref 3.87–5.11)
RDW: 14 % (ref 11.5–15.5)
WBC: 3.7 10*3/uL — ABNORMAL LOW (ref 4.0–10.5)
nRBC: 0 % (ref 0.0–0.2)

## 2019-10-10 LAB — MAGNESIUM: Magnesium: 1.6 mg/dL — ABNORMAL LOW (ref 1.7–2.4)

## 2019-10-10 LAB — BRAIN NATRIURETIC PEPTIDE: B Natriuretic Peptide: 263.3 pg/mL — ABNORMAL HIGH (ref 0.0–100.0)

## 2019-10-10 MED ORDER — ALBUTEROL SULFATE HFA 108 (90 BASE) MCG/ACT IN AERS
2.0000 | INHALATION_SPRAY | Freq: Four times a day (QID) | RESPIRATORY_TRACT | Status: DC
  Administered 2019-10-10: 15:00:00 2 via RESPIRATORY_TRACT
  Filled 2019-10-10: qty 6.7

## 2019-10-10 NOTE — Telephone Encounter (Signed)
Noted. Thanks. Agreed.  

## 2019-10-10 NOTE — ED Triage Notes (Signed)
Pt to ER for evaluation of cough, diarrhea, altered mental status, and shortness of breath, COVID + x1.5 week.

## 2019-10-10 NOTE — Telephone Encounter (Signed)
I spoke with pt;pt said for 2 wks pt having hallucinations on and off at night; pt has prod cough with beige phlegm, SOB when laying down or up moving around. I advised pt with SOB and hallucinations she will need to be seen at [facility] ED. Pt's husband will take her to [facility] ED now; I called and spoke with [facility] ED triage to advise of pt coming due to pt having covid. FYI to Dr Damita Dunnings.

## 2019-10-10 NOTE — ED Provider Notes (Signed)
[facility] EMERGENCY DEPARTMENT Provider Note   CSN: [id number] Arrival date & time: 10/10/19  4235     History   Chief Complaint Chief Complaint  Patient presents with   Shortness of Breath   Altered Mental Status    HPI Natasha Woodard is a 78 y.o. female.     HPI Patient with known coronavirus infection presents with concern of weakness, cough, and hallucinations. Patient has history of multiple medical issues including CHF, diabetes, hypertension. She states that she has been taking her medication as directed.  Patient started having URI-like illness about 2 weeks ago.  She also notes also of smell, taste. She was diagnosed positive for coronavirus about 10 days ago. She notes that since that time she has had persistent cough, congestion, weakness, and anorexia. No focal pain. Over the past day or so she has noticed increasing cough, and hallucinations. She has had hallucinations in the past, which have been attributed to medications.  She denies recent change in her medications, which includes tramadol.   Past Medical History:  Diagnosis Date   Calcific tendonitis 2008   Treatment of left leg   Cardiomyopathy (HCC) 06/2017   a) Echo: EF 25 and 30%. GR 1 DD. Mild DD with elevated LVEDP. Mild valvular disease.; b) Cardiac MRI 2/'19: frequent PVCs (diffiuclt to interpret) - EF ~27% w/ diffuse HK.  No evidence of infarct, infiltrative Dz or myocarditis. -- ? if related to PVCs.  Mild RV dilation with normal function.; c) f/u Echo 6/'19: EF 35-40%. Gr 1 DD. Diffuse HK. Mod TR. Mild RV dilation.   Chronic combined systolic and diastolic CHF, NYHA class 2 and ACA/AHA stage C    Coronary artery disease, non-occlusive    mild-moderate CAD 06/30/17 cath   CTS (carpal tunnel syndrome)    Diabetes mellitus    Type II   Dysrhythmia    patient said that she cant remember what it is   Frequent unifocal PVCs 01/2018   Event monitor showed normal  sinus rhythm with frequent multifocal PVCs (9%) and PACs.  Nighttime bradycardia suggestive of OSA.   Hiatal hernia    with reflux   Hyperlipidemia    Statin intolerant   Hypertension    Ichthyosis congenita    NSVD (normal spontaneous vaginal delivery) 1971 & 1972   Obesity    Plantar fasciitis    Left   Pulmonary nodule    Imaged multiple times and benign appearing   Wears glasses     Patient Active Problem List   Diagnosis Date Noted   CKD (chronic kidney disease) stage 3, GFR 30-59 ml/min 09/25/2019   Gastroesophageal reflux disease 06/19/2019   Abdominal pain 02/06/2019   Health care maintenance 11/14/2018   Joint pain 08/07/2018   Cough 06/17/2018   Sensorineural hearing loss (SNHL), bilateral 03/20/2018   Skin lesion 10/05/2017   Complex sleep apnea syndrome 10/05/2017   Chronic combined systolic and diastolic heart failure (HCC) 09/08/2017   Dilated cardiomyopathy (HCC) 06/30/2017   Frequent PVCs 06/30/2017   Dyspnea on exertion 06/30/2017   Fungal skin disease 12/31/2015   Advance care planning 08/19/2014   Medicare annual wellness visit, subsequent 07/30/2013   Hot flashes 08/15/2012   KNEE PAIN, LEFT 11/09/2010   Diabetes mellitus without complication (HCC) 05/19/2010   HLD (hyperlipidemia) 05/19/2010   BMI 30.0-30.9,adult 05/19/2010   Essential hypertension 05/19/2010   EMPHYSEMA 05/19/2010   HIATAL HERNIA WITH REFLUX 05/19/2010   OTHER CONGENITAL ANOMALY GALLBLADDER BDS&LIVER 05/19/2010  ICHTHYOSIS CONGENITA 05/19/2010    Past Surgical History:  Procedure Laterality Date   ABDOMINAL HYSTERECTOMY  1975   TAH-BSO   ABSCESS DRAINAGE  1972   right breast   BREAST BIOPSY Left 01/14/2009   Stereo- Benign   CARDIAC MRI  01/2018   Difficult to interpret 2/2 PVCs. Normal LV size - EF ~27% with diffuse HK. Mild RV dilation - normal fxn. -- NO MYOCARDIAL LGI => no definitive evidence of prior MI, Infiltrative Dz or  Myocarditis -- suspect NICM, possibly related to PVCs.   CARPAL TUNNEL RELEASE Right 07/02/2014   Procedure: RIGHT CARPAL TUNNEL RELEASE;  Surgeon: Nicki Reaper, MD;  Location: [facility];  Service: Orthopedics;  Laterality: Right;   CARPAL TUNNEL RELEASE Left 06/11/2015   Procedure: LEFT CARPAL TUNNEL RELEASE;  Surgeon: Cindee Salt, MD;  Location: [facility];  Service: Orthopedics;  Laterality: Left;  REGIONAL/FAB   COLONOSCOPY     corn removal  1968   right   Holter Monitor  06/2017   ~17,000 PVC beats - majority were singlets, some couplets. 44 brief 3-7 beat runs of NSVT. Also noted were less frequent PACs with 11 runs (longest 15 beats)   OPEN REDUCTION INTERNAL FIXATION (ORIF) DISTAL RADIAL FRACTURE Left 07/21/2017   Procedure: OPEN REDUCTION INTERNAL FIXATION (ORIF) LEFT DISTAL RADIAL FRACTURE;  Surgeon: Sheral Apley, MD;  Location: [facility] OR;  Service: Orthopedics;  Laterality: Left;   RIGHT/LEFT HEART CATH AND CORONARY ANGIOGRAPHY N/A 06/30/2017   Procedure: Right/Left Heart Cath and Coronary Angiography;  Surgeon: Marykay Lex, MD;  Location: [facility] INVASIVE CV LAB:  pRCA 55%, pCx 40%, OM1 45%, mCx 50%, D2 50% - LVEF 25-35%. Moderately elevated LVEDP(26 mmHg with PCWP 16 mmHg).  FICK CO/CI: 4.47/2.48. PA pressures 47/14 mmHg with a mean of 27 mm.   ROTATOR CUFF REPAIR     left shoulder   TRANSTHORACIC ECHOCARDIOGRAM  06/2017   EF 25 and 30%. GR 1 DD. Mild diastolic dysfunction with elevated LVEDP. Mild valvular disease.   TRANSTHORACIC ECHOCARDIOGRAM  11'18, 6/'19    a) EF remains 30-35%.  Diffuse hypokinesis noted.  Severe LA dilation; b) Improved EF 35-40%.  Diffuse HK.  GR 1 DD.  Moderate TR.  Mild RV dilation.     OB History   No obstetric history on file.      Home Medications    Prior to Admission medications   Medication Sig Start Date End Date Taking? Authorizing Provider  amiodarone (PACERONE) 200 MG tablet Take 0.5 tablets (100  mg total) by mouth daily. 04/10/19   [name], MD  aspirin 81 MG tablet Take 1 tablet (81 mg total) by mouth daily. 09/14/17   [name], MD  carvedilol (COREG) 25 MG tablet Take 1 tablet (25 mg total) by mouth 2 (two) times daily. 04/10/19   [name], MD  Cholecalciferol (VITAMIN D) 1000 UNITS capsule Take 1,000 Units by mouth daily.     [provider]  doxycycline (VIBRA-TABS) 100 MG tablet Take 1 tablet (100 mg total) by mouth 2 (two) times daily. 10/03/19   [name], MD  empagliflozin (JARDIANCE) 10 MG TABS tablet Take 5 mg by mouth daily before breakfast. 10/02/19   [name], MD  glucose blood (ACCU-CHEK AVIVA PLUS) test strip USE TO TEST BLOOD SUGAR ONCE DAILY FOR DM E11.9 08/09/19   [name], MD  Lancets (ACCU-CHEK MULTICLIX) lancets USE ONE LANCET TO TEST BLOOD  GLUCOSE DAILY FOR DM 250.00 08/09/19   Joaquim Namuncan, Graham S, MD  lovastatin (MEVACOR) 40 MG tablet Take 1 tablet (40 mg total) by mouth at bedtime. 04/10/19   Joaquim Namuncan, Graham S, MD  metFORMIN (GLUCOPHAGE) 500 MG tablet Take 0.5 tablets (250 mg total) by mouth 2 (two) times daily with a meal. 04/10/19   Joaquim Namuncan, Graham S, MD  nystatin (MYCOSTATIN/NYSTOP) powder Apply topically 2 (two) times daily. 09/23/19   Joaquim Namuncan, Graham S, MD  omeprazole (PRILOSEC) 20 MG capsule Take 1 capsule (20 mg total) by mouth daily. For heartburn. 08/25/19   Joaquim Namuncan, Graham S, MD  sacubitril-valsartan (ENTRESTO) 97-103 MG Take 1 tablet by mouth 2 (two) times daily. 04/10/19   Joaquim Namuncan, Graham S, MD  spironolactone (ALDACTONE) 25 MG tablet Take 1/2 (one-half) tablet by mouth once daily 04/10/19   Joaquim Namuncan, Graham S, MD  traMADol (ULTRAM) 50 MG tablet Take 1 tablet (50 mg total) by mouth every 12 (twelve) hours as needed. 04/14/19   Joaquim Namuncan, Graham S, MD    Family History Family History  Problem Relation Age of Onset   Heart disease Mother        s/p pacemaker   Diabetes Mother    Heart disease Father    Diabetes  Father    Heart disease Brother        MI   Cancer Brother        Lung   Hypertension Other    Colon cancer Neg Hx    Breast cancer Neg Hx     Social History Social History   Tobacco Use   Smoking status: Former Smoker    Years: 28.00    Types: Cigarettes    Quit date: 12/12/1993    Years since quitting: 25.8   Smokeless tobacco: Never Used  Substance Use Topics   Alcohol use: No    Alcohol/week: 0.0 standard drinks   Drug use: No     Allergies   Ezetimibe-simvastatin, Lipitor [atorvastatin], Tamiflu [oseltamivir phosphate], Pravastatin sodium, and Sulfonamide derivatives   Review of Systems Review of Systems  Constitutional:       Per HPI, otherwise negative  HENT:       Per HPI, otherwise negative  Respiratory:       Per HPI, otherwise negative  Cardiovascular:       Per HPI, otherwise negative  Gastrointestinal: Negative for vomiting.  Endocrine:       Negative aside from HPI  Genitourinary:       Neg aside from HPI   Musculoskeletal:       Per HPI, otherwise negative  Skin: Negative.   Allergic/Immunologic: Negative for immunocompromised state.  Neurological: Positive for weakness. Negative for syncope.  Psychiatric/Behavioral: Positive for hallucinations.     Physical Exam Updated Vital Signs BP (!) 141/70    Pulse 60    Temp 99.2 F (37.3 C) (Oral)    Resp 18    SpO2 94%   Physical Exam Vitals signs and nursing note reviewed.  Constitutional:      Appearance: She is ill-appearing.  HENT:     Head: Normocephalic and atraumatic.  Eyes:     Conjunctiva/sclera: Conjunctivae normal.  Cardiovascular:     Rate and Rhythm: Normal rate and regular rhythm.  Pulmonary:     Effort: Pulmonary effort is normal. Tachypnea present.     Breath sounds: No stridor.  Abdominal:     General: There is no distension.  Skin:    General: Skin is warm and dry.  Neurological:     Mental Status: She is alert and oriented to person, place, and time.      Cranial Nerves: No cranial nerve deficit.      ED Treatments / Results  Labs (all labs ordered are listed, but only abnormal results are displayed) Labs Reviewed  COMPREHENSIVE METABOLIC PANEL - Abnormal; Notable for the following components:      Result Value   Glucose, Bld 107 (*)    Creatinine, Ser 1.27 (*)    Total Protein 6.4 (*)    Albumin 3.0 (*)    GFR calc non Af Amer 40 (*)    GFR calc Af Amer 46 (*)    All other components within normal limits  CBC WITH DIFFERENTIAL/PLATELET - Abnormal; Notable for the following components:   WBC 3.7 (*)    All other components within normal limits  BRAIN NATRIURETIC PEPTIDE - Abnormal; Notable for the following components:   B Natriuretic Peptide 263.3 (*)    All other components within normal limits  MAGNESIUM - Abnormal; Notable for the following components:   Magnesium 1.6 (*)    All other components within normal limits    EKG EKG Interpretation  Date/Time:  Thursday October 10 2019 12:18:36 EDT Ventricular Rate:  60 PR Interval:    QRS Duration: 137 QT Interval:  445 QTC Calculation: 445 R Axis:   -60 Text Interpretation: Sinus rhythm RBBB and LAFB Probable left ventricular hypertrophy Nonspecific T abnormalities, lateral leads Abnormal ECG Confirmed by Carmin Muskrat [phone]) on 10/10/2019 12:49:53 PM   Radiology Dg Chest Port 1 View  Result Date: 10/10/2019 CLINICAL DATA:  Cough. EXAM: PORTABLE CHEST 1 VIEW COMPARISON:  August 21, 2017. FINDINGS: Stable cardiomediastinal silhouette. Mild central pulmonary vascular congestion is noted. Atherosclerosis of thoracic aorta is noted. No pneumothorax or pleural effusion is noted. Mild bibasilar atelectasis or infiltrates are noted. Bony thorax is unremarkable. IMPRESSION: Mild central pulmonary vascular congestion is noted. Mild bibasilar atelectasis or infiltrates are noted. Aortic Atherosclerosis (ICD10-I70.0). Electronically Signed   By: Marijo Conception M.D.   On:  10/10/2019 12:42    Procedures Procedures (including critical care time)  Medications Ordered in ED Medications  albuterol (VENTOLIN HFA) 108 (90 Base) MCG/ACT inhaler 2 puff (has no administration in time range)     Initial Impression / Assessment and Plan / ED Course  I have reviewed the triage vital signs and the nursing notes.  Pertinent labs & imaging results that were available during my care of the patient were reviewed by me and considered in my medical decision making (see chart for details).        3:07 PM Patient in no distress, awake, alert, speaking clearly.  She continues to be breathing easily, without supplemental oxygen.  We reviewed all findings including the minor abnormalities, but no substantial changes from her baseline labs. X-ray consistent with coronavirus infection. Absent hemodynamic instability, hypoxia, no indication for emergent intervention. We discussed option of admission for further monitoring, therapy.  Patient has strong preference for going home.  We discussed the importance of following up with primary care and/or return precautions.   Regards the patient's occasional hallucination, there are some suspicion for tramadol contributing to this, she notes that she has previously had to stop it for this reaction.  Patient will withhold this medication, pending recovery from her illness. With no hemodynamic instability, patient discharged in stable condition, though with guarded condition.  Final Clinical Impressions(s) / ED Diagnoses   Final diagnoses:  COVID-19 virus infection    ED Discharge Orders    None       Gerhard MunchLockwood, Khairi Garman, MD 10/10/19 1510

## 2019-10-10 NOTE — Telephone Encounter (Signed)
[facility] Night - Client Nonclinical Telephone Record AccessNurse Client [facility] Night - Client Client Site [facility] [place] Physician Renford Dills - MD Contact Type Call Who Is Calling Patient / Member / Family / Caregiver Caller Name [place] Phone Number [phone] Patient Name Natasha Woodard Patient DOB July 20, 1941 Call Type Message Only Information Provided Reason for Call Request to Schedule Office Appointment Initial Comment Caller states she is needing to schedule an appt for today if possible, has covid, having issues, and asks for a call back. Symptoms: coughing, shortness of breath, hallucinations, no fever: 98.0. Refused triage. Additional Comment Provided information for a call back from the office, connected caller to the office for further assistance. Call Closed By: Rosana Fret Transaction Date/Time: 10/10/2019 7:01:34 AM (ET)

## 2019-10-10 NOTE — ED Notes (Signed)
Patient verbalizes understanding of discharge instructions. Opportunity for questioning and answers were provided. Armband removed by staff, pt discharged from ED.  

## 2019-10-10 NOTE — Discharge Instructions (Addendum)
As discussed, your recovery from the coronavirus may take up to another 2 weeks. Please use the provided albuterol for additional cough relief. If you develop new, or concerning changes in your condition, please be sure to return here for additional evaluation immediately.  Otherwise, please be sure to follow-up with your physician.

## 2019-10-11 ENCOUNTER — Telehealth: Payer: Self-pay

## 2019-10-11 MED ORDER — ACETAMINOPHEN 500 MG PO TABS: 500.0000 mg | ORAL_TABLET | Freq: Three times a day (TID) | ORAL | Status: DC | PRN

## 2019-10-11 NOTE — Telephone Encounter (Signed)
Duly noted about the tamiflu.   Her recent sx could have been due to tramadol by itself (this is not likely since she had tolerated it prev), or from covid by itself (this is possible), or the combination of covid and tramadol (this is possible).    I think it would be best not to use tramadol over the weekend and consider re-trial of tramadol when she is clearly feeling better.  That would likely be the safest option.  At that point, she may be able to tolerate 1 tab daily- but I wouldn't restart it yet.    Thanks.

## 2019-10-11 NOTE — Telephone Encounter (Signed)
Pt aware of recommendations - pt will restart Tramadol next week.  Nothing further needed.

## 2019-10-11 NOTE — Telephone Encounter (Signed)
I don't think I knew that she had sx attributable to tramadol prior to this episode.   I added it to her intolerance list.  Agree with stopping tramadol.   I would take tylenol instead, 500-1000mg  up to TID PRN for joint pain and avoid other extra meds given her other conditions.  See how that does and let me know.   Thanks.

## 2019-10-11 NOTE — Telephone Encounter (Signed)
Spoke with patient. Patient states that documentation about her medication in ER note was incorrect. Patient states she had hallucinations before in February 2019 after taking Tamiflu. This time she thought maybe it was from Tramadol so she did not take any yesterday or today. So far no hallucinations the past 2 days but prior to that had them Monday, Tuesday and Wednesday this week. Patient states she would rather take Tramadol than Tylenol and wonders if she can maybe just take 1 tablet daily instead of 2 tablets daily and see if her symptoms return if it was due to this medication? Please review.

## 2019-10-11 NOTE — Telephone Encounter (Signed)
Patient left a message on triage line stating that she was at the ER yesterday. She was there due to dyspnea and also discussing occasional hallucinations and in the ER note it notes patient said this happened before and it was due to Tramadol and when she stopped it before these symptoms resolved. Patient is going to stop taking Tramadol at this time and wants to know what can she takes in place of this that would help her. Patient states she has hard time sleeping and trouble relaxing. She needs something to take instead, maybe something less strong. Please review.

## 2019-10-13 ENCOUNTER — Encounter: Payer: Self-pay | Admitting: Family Medicine

## 2019-10-13 NOTE — Telephone Encounter (Signed)
Noted. Thanks.  Will await update from patient.  

## 2019-10-14 ENCOUNTER — Encounter: Payer: Self-pay | Admitting: Family Medicine

## 2019-10-14 ENCOUNTER — Telehealth: Payer: Self-pay

## 2019-10-14 ENCOUNTER — Other Ambulatory Visit: Payer: Self-pay | Admitting: Family Medicine

## 2019-10-14 MED ORDER — TRAMADOL HCL 50 MG PO TABS: 50.0000 mg | ORAL_TABLET | Freq: Every day | ORAL | Status: DC | PRN

## 2019-10-14 NOTE — Telephone Encounter (Signed)
Spoke with patient to check up on her symptoms with Covid.  She states she is not feeling well but is definitely improving.  Patient states she has no fever and no problems with breathing.  Patient states she did send in a MyChart request to use Tramadol instead of Tylenol to Dr. Damita Dunnings.  Please see MyChart message.

## 2019-10-14 NOTE — Telephone Encounter (Signed)
Noted. Thanks.

## 2019-10-14 NOTE — Telephone Encounter (Signed)
[facility] Night - Client Nonclinical Telephone Record AccessNurse Client [place] Night - Client Client Site [facility] [place] Physician Renford Dills - MD Contact Type Call Who Is Calling Patient / Member / Family / Caregiver Caller Name [place] Phone Number [phone] Patient Name Natasha Woodard Patient DOB 1941/11/01 Call Type Message Only Information Provided Reason for Call Request for General Office Information Initial Comment Caller states wants to cancel request for question about Covid testing. Additional Comment Call Closed By: Artis Flock Transaction Date/Time: 10/12/2019 12:16:37 PM (ET)

## 2019-10-25 ENCOUNTER — Other Ambulatory Visit: Payer: Self-pay | Admitting: Family Medicine

## 2019-10-28 ENCOUNTER — Other Ambulatory Visit: Payer: Self-pay

## 2019-10-28 ENCOUNTER — Encounter: Payer: Self-pay | Admitting: Cardiology

## 2019-10-28 ENCOUNTER — Ambulatory Visit: Payer: Medicare Other | Admitting: Cardiology

## 2019-10-28 VITALS — BP 152/78 | HR 58 | Temp 96.6°F | Ht 62.0 in | Wt 172.0 lb

## 2019-10-28 DIAGNOSIS — I493 Ventricular premature depolarization: Secondary | ICD-10-CM | POA: Diagnosis not present

## 2019-10-28 DIAGNOSIS — I420 Dilated cardiomyopathy: Secondary | ICD-10-CM

## 2019-10-28 DIAGNOSIS — R0600 Dyspnea, unspecified: Secondary | ICD-10-CM | POA: Diagnosis not present

## 2019-10-28 DIAGNOSIS — I10 Essential (primary) hypertension: Secondary | ICD-10-CM | POA: Diagnosis not present

## 2019-10-28 DIAGNOSIS — I5042 Chronic combined systolic (congestive) and diastolic (congestive) heart failure: Secondary | ICD-10-CM

## 2019-10-28 DIAGNOSIS — R0609 Other forms of dyspnea: Secondary | ICD-10-CM

## 2019-10-28 NOTE — Patient Instructions (Signed)
Medication Instructions:  No changes  *If you need a refill on your cardiac medications before your next appointment, please call your pharmacy*  Lab Work: Not needed  Testing/Procedures: Will be schedule at [place] [facility] has requested that you have an echocardiogram. Echocardiography is a painless test that uses sound waves to create images of your heart. It provides your doctor with information about the size and shape of your heart and how well your heart's chambers and valves are working. This procedure takes approximately one hour. There are no restrictions for this procedure.    Follow-Up: At [facility], you and your health needs are our priority.  As part of our continuing mission to provide you with exceptional heart care, we have created designated Provider Care Teams.  These Care Teams include your primary Cardiologist (physician) and Advanced Practice Providers (APPs -  Physician Assistants and Nurse Practitioners) who all work together to provide you with the care you need, when you need it.  Your next appointment:   4 months- April 2021  The format for your next appointment:   In Person  Provider:   Glenetta Hew, MD  Other Instructions

## 2019-10-28 NOTE — Progress Notes (Signed)
Primary Care Provider: Tonia Ghent, MD Cardiologist: Glenetta Hew, MD Electrophysiologist:   Clinic Note: Chief Complaint  Patient presents with  . Follow-up    Recent COVID-19 infection  . Cardiomyopathy    No active heart failure symptoms.    HPI:    Natasha Woodard is a 78 y.o. female with a PMH below who presents today for what was scheduled to be a 6+ month follow-up but is now actually opposed to COVID-19 infection follow-up.  Natasha Woodard has a history of dilated cardiomyopathy with an EF of down to 40 to 30% (July 2018) ordered for evaluation of and then thought to be because of extremely frequent PVCs of roughly 70%. -> Nonischemic cardiac cath; LVEDP 26 mmHg, PCWP 16 mm 3.  She was started on carvedilol, Entresto and spironolactone and then PVCs were treated with amiodarone (she is being comanaged by Dr. Haroldine Laws from the advanced heart failure service).    Subsequently, her EF gradually progressed to 35 and 40% by June 2019 and then as of January 2020 her EF was up to 50-55% with near resolution of symptoms.  Natasha Woodard was last seen via Telemedicine in April --> she had been evaluated with a Zio patch now showing less than 1% PVCs on monitor.  She was back doing her water aerobics and other exercises.  She was actually taken off of Lasix with a combination of Entresto, spironolactone and Jardiance keeping her euvolemic. -->*She noted that she was exercising 4 days a week doing strength and conditioning, but was somewhat limited by the COVID-19 restrictions.  She would also do treadmill 2 to 3 days a week.  No notable edema.  Class I-II CHF (HFpEF)  07/2019 -CHF Clinic Dr. Haroldine Laws --> with addition of Jardiance, Lasix DC'd.  Amiodarone continued at 100 mg daily --> recommended weight loss of 15 to 20 pounds.  Recent Hospitalizations:   Contracted COVID-19 felt to be October 15 as onset of symptoms.  Positive test on 10/19  ER visit 10/10/2019: For  altered mental status and worsening dyspnea. -On exam she is quite tachypneic and ill-appearing.  BNP was 263, and chest x-ray was consistent with coronavirus infection.  She had stable hemodynamics and was not hypoxic.  Recommendation was home convalescence.  She basely quarantine for another 2 weeks   Reviewed  CV studies:    The following studies were reviewed today: (if available, images/films reviewed: From Epic Chart or Care Everywhere) . No new evaluation   Interval History:   Natasha Woodard returns here today recounting her 4-week struggle with COVID-19-- --> she indicates that the vector was through her husband who contracted it while playing cards with their son and his pastor along with a few with a gentleman.  The pastor apparently ended up testing positive.  Thankfully, her husband and son were not as sick as she was.  Her symptoms:->  Noted to have persistent intractable cough, congestion and weakness.  She had significant diarrhea with abdominal pain.  She had loss of taste and smell along with anorexia.  She also noted intermittent fevers with body aches.   She went to the emergency room because you started having some visual hallucinations and was just feeling very poorly.  She was not getting good sleep.  She now seems to have recovered from a standpoint of breathing with notably improved cough, and improving energy levels.  She still is quite fatigued and worn out.  Feeling like she is having  to sleep quite a bit.  She still has some anorexia and notes that she is lost about 10 pounds.  She is however starting to get back into doing some walking now, but notes that she cannot do what she was able to do.  She denies any PND orthopnea, and the cough is notably improved.  No chest tightness or pressure with rest or exertion.  She clearly has exertional dyspnea notably worse than it was prior to her COVID-19 infection, but significantly improved from 2 weeks ago.  She is now  scared however to go out to her exercise classes, but is willing to go outside and do exercise.  Thankfully, she not having any edema or any rapid irregular heartbeats palpitations.  She does not have any signs to suggest any cardiac involvement from her infection..  CV Review of Symptoms (Summary) positive for - dyspnea on exertion and Fatigue, exercise intolerance, but no chest pain. negative for - edema, irregular heartbeat, orthopnea, palpitations, paroxysmal nocturnal dyspnea, rapid heart rate, shortness of breath or Syncope/near syncope, TIA/amaurosis fugax, claudication  The patient does not currently have symptoms concerning for COVID-19 infection (fever, chills, cough, or new shortness of breath).  Symptoms of essentially resolved The patient is practicing social distancing. ++  Masking.  She is pretty much not going out besides doctors visits and outside to walk.  REVIEWED OF SYSTEMS   A comprehensive ROS was performed. Review of Systems  Constitutional: Positive for malaise/fatigue (Feels like she was run over by a truck) and weight loss (She says 10 pounds during the Covid 19 infection).  HENT: Negative for congestion and nosebleeds.   Respiratory: Positive for cough (Significantly improved minimal coughing now.  Nonproductive) and shortness of breath (Also notably improved). Negative for wheezing.   Gastrointestinal: Positive for abdominal pain (This is getting better.  Not as much since she is no longer coughing). Negative for blood in stool, melena, nausea and vomiting.       Still having some issues with taste and smell, but improving.  Simply still has some anorexia.  Genitourinary: Negative for hematuria.  Musculoskeletal: Positive for joint pain. Negative for falls.  Neurological: Positive for dizziness (Also getting better.) and weakness (Generalized, but getting better).  Psychiatric/Behavioral: Negative for memory loss. The patient is nervous/anxious (Worsening anxiety  now that she has been infected) and has insomnia (Just not sleeping well.  Has gotten much better as days go by, still having issues sleeping.).   All other systems reviewed and are negative.  I have reviewed and (if needed) personally updated the patient's problem list, medications, allergies, past medical and surgical history, social and family history.   PAST MEDICAL HISTORY   Past Medical History:  Diagnosis Date  . Calcific tendonitis 2008   Treatment of left leg  . Cardiomyopathy ([place]) 06/2017   a) Echo: EF 25 and 30%. GR 1 DD. Mild DD with elevated LVEDP. Mild valvular disease.; b) Cardiac MRI 2/'19: frequent PVCs (diffiuclt to interpret) - EF ~27% w/ diffuse HK.  No evidence of infarct, infiltrative Dz or myocarditis. -- ? if related to PVCs. c) f/u Echo 6/'19: EF 35-40%. Gr 1 DD. Diffuse HK.-> d) Jan 2020 EF 50 to 55%.  Mod MAC, mod LA dilation.  Aortic sclerosis  . Chronic combined systolic and diastolic CHF, NYHA class 2 and ACA/AHA stage C    Now essentially resolved, back to simply diastolic CHF  . Coronary artery disease, non-occlusive    mild-moderate CAD 06/30/17 cath  .  CTS (carpal tunnel syndrome)   . Diabetes mellitus    Type II  . Dysrhythmia    patient said that she cant remember what it is  . 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  Nighttime bradycardia suggestive of OSA.  Marland Kitchen Hiatal hernia    with reflux  . Hyperlipidemia    Statin intolerant  . Hypertension   . Ichthyosis congenita   . NSVD (normal spontaneous vaginal delivery) 1971 & 1972  . Obesity   . Plantar fasciitis    Left  . Pulmonary nodule    Imaged multiple times and benign appearing  . Wears glasses      PAST SURGICAL HISTORY   Past Surgical History:  Procedure Laterality Date  . ABDOMINAL HYSTERECTOMY  1975   TAH-BSO  . ABSCESS DRAINAGE  1972   right breast  . BREAST BIOPSY Left 01/14/2009   Stereo- Benign   . CARDIAC MRI  01/2018   Difficult to interpret 2/2 PVCs. Normal LV size - EF ~27% with diffuse HK. Mild RV dilation - normal fxn. -- NO MYOCARDIAL LGI => no definitive evidence of prior MI, Infiltrative Dz or Myocarditis -- suspect NICM, possibly related to PVCs.  . CARPAL TUNNEL RELEASE Right 07/02/2014   Procedure: RIGHT CARPAL TUNNEL RELEASE;  Surgeon: Wynonia Sours, MD;  Location: [facility];  Service: Orthopedics;  Laterality: Right;  . CARPAL TUNNEL RELEASE Left 06/11/2015   Procedure: LEFT CARPAL TUNNEL RELEASE;  Surgeon: Daryll Brod, MD;  Location: [facility];  Service: Orthopedics;  Laterality: Left;  REGIONAL/FAB  . COLONOSCOPY    . corn removal  1968   right  . Holter Monitor  06/2017   ~17,000 PVC beats - majority were singlets, some couplets. 44 brief 3-7 beat runs of NSVT. Also noted were less frequent PACs with 11 runs (longest 15 beats)  . OPEN REDUCTION INTERNAL FIXATION (ORIF) DISTAL RADIAL FRACTURE Left 07/21/2017   Procedure: OPEN REDUCTION INTERNAL FIXATION (ORIF) LEFT DISTAL RADIAL FRACTURE;  Surgeon: Renette Butters, MD;  Location: [facility];  Service: Orthopedics;  Laterality: Left;  . RIGHT/LEFT HEART CATH AND CORONARY ANGIOGRAPHY N/A 06/30/2017   Procedure: Right/Left Heart Cath and Coronary Angiography;  Surgeon: Leonie Man, MD;  Location: [facility] INVASIVE CV LAB:  pRCA 55%, pCx 40%, OM1 45%, mCx 50%, D2 50% - LVEF 25-35%. Moderately elevated LVEDP(26 mmHg with PCWP 16 mmHg).  FICK CO/CI: 4.47/2.48. PA pressures 47/14 mmHg with a mean of 27 mm.  Marland Kitchen ROTATOR CUFF REPAIR     left shoulder  . TRANSTHORACIC ECHOCARDIOGRAM  06/2017   EF 25 and 30%. GR 1 DD. Mild diastolic dysfunction with elevated LVEDP. Mild valvular disease.  . TRANSTHORACIC ECHOCARDIOGRAM  11'18, 6/'19    a) EF remains 30-35%.  Diffuse hypokinesis noted.  Severe LA dilation; b) Improved EF 35-40%.  Diffuse HK.  GR 1 DD.  Moderate TR.  Mild RV dilation.  . TRANSTHORACIC  ECHOCARDIOGRAM  01/09/2019   EF 50 to 55%.  Impaired relaxation.  Notable improvement of LV function.  Moderate LA dilation.  Moderate MAC.  Aortic sclerosis with no stenosis.  Marland Kitchen ZIO Patch 14 d Event Monitor  10/2018   Results as below <1% PVC.  (Notably improved after starting amiodarone)    TTE January 09, 2019: EF 50 to 55%.  Impaired relaxation.  Notable improvement of LV function.  Moderate LA dilation.  Moderate MAC.  Aortic sclerosis with no stenosis.  ZioPatch in 11/19. Results as below <1% PVC.  (Notably improved after starting amiodarone)  MEDICATIONS/ALLERGIES   Current Meds  Medication Sig  . amiodarone (PACERONE) 200 MG tablet TAKE 1/2 TABLET BY MOUTH ONCE DAILY  . aspirin 81 MG tablet Take 1 tablet (81 mg total) by mouth daily.  . carvedilol (COREG) 25 MG tablet TAKE 1 TABLET BY MOUTH TWICE DAILY  . Cholecalciferol (VITAMIN D) 1000 UNITS capsule Take 1,000 Units by mouth daily.   . empagliflozin (JARDIANCE) 10 MG TABS tablet Take 5 mg by mouth daily before breakfast.  . glucose blood (ACCU-CHEK AVIVA PLUS) test strip USE TO TEST BLOOD SUGAR ONCE DAILY FOR DM E11.9  . Lancets (ACCU-CHEK MULTICLIX) lancets USE ONE LANCET TO TEST BLOOD GLUCOSE DAILY FOR DM 250.00  . lovastatin (MEVACOR) 40 MG tablet TAKE 1 TABLET BY MOUTH AT BEDTIME  . metFORMIN (GLUCOPHAGE) 500 MG tablet TAKE HALF TABLET BY MOUTH TWICE DAILY WITH A MEAL  . sacubitril-valsartan (ENTRESTO) 97-103 MG Take 1 tablet by mouth 2 (two) times daily.  Marland Kitchen spironolactone (ALDACTONE) 25 MG tablet TAKE 1/2 TABLET BY MOUTH ONCE DAILY  . traMADol (ULTRAM) 50 MG tablet Take 1 tablet (50 mg total) by mouth daily as needed.    Allergies  Allergen Reactions  . Ezetimibe-Simvastatin Other (See Comments)    REACTION: Muscle aches (side effect)  . Lipitor [Atorvastatin] Other (See Comments)    Leg weakness   . Tamiflu [Oseltamivir Phosphate] Other (See Comments)    nightmares  . Tramadol Other (See Comments)     Hallucinations- this was while she was ill with covid and may have been related to covid- she had prev tolerated med w/o ADE  . Pravastatin Sodium Other (See Comments)    REACTION: Muscle aches (side effect)  . Sulfonamide Derivatives Nausea And Vomiting     SOCIAL HISTORY/FAMILY HISTORY   Social History   Tobacco Use  . Smoking status: Former Smoker    Years: 28.00    Types: Cigarettes    Quit date: 12/12/1993    Years since quitting: 25.8  . Smokeless tobacco: Never Used  Substance Use Topics  . Alcohol use: No    Alcohol/week: 0.0 standard drinks  . Drug use: No   Social History   Social History Narrative   Two kids, two grandchildren, two great grandchildren.    Married 1969   Retired.     Family History family history includes Cancer in her brother; Diabetes in her father and mother; Heart disease in her brother, father, and mother; Hypertension in an other family member.   OBJCTIVE -PE, EKG, labs   Wt Readings from Last 3 Encounters:  10/28/19 172 lb (78 kg)  09/23/19 176 lb (79.8 kg)  08/05/19 176 lb 4 oz (79.9 kg)    Physical Exam: BP (!) 152/78   Pulse (!) 58   Temp (!) 96.6 F (35.9 C)   Ht _0  (1.575 m)   Wt 172 lb (78 kg)   SpO2 99%   BMI 31.46 kg/m  Physical Exam  Constitutional: She is oriented to person, place, and time. She appears well-developed and well-nourished. No distress.  Actually besides being a little bit tired appearing, she seems to be in better shape normal expected.  Well-groomed.  HENT:  Head: Normocephalic and atraumatic.  Neck: Normal range of motion. Neck supple. No JVD present.  Cardiovascular: Normal rate, regular rhythm, S1 normal and S2 normal.  Occasional extrasystoles are present.  PMI is not displaced (Difficult to palpate). Exam reveals gallop, S4, distant heart sounds and decreased pulses (Palpable but diminished pedal pulses). Exam reveals no friction rub.  No murmur heard. Pulmonary/Chest: Effort normal and  breath sounds normal. No respiratory distress. She has no wheezes. She has no rales. She exhibits no tenderness.  Abdominal: Soft. Bowel sounds are normal. She exhibits no distension. There is abdominal tenderness (Mildly tender). There is no rebound and no guarding.  Musculoskeletal: Normal range of motion.        General: No edema.  Neurological: She is alert and oriented to person, place, and time.  Skin: Skin is warm and dry. She is not diaphoretic.  Chronic, thick dry skin  Psychiatric: She has a normal mood and affect. Her behavior is normal.  Still has a hard time understanding explanations.  Has multiple questions sometimes asking the same thing several times in different ways.  Vitals reviewed.   Adult ECG Report  Recent Labs: No recent lipids. Lab Results  Component Value Date   CHOL 141 11/06/2018   HDL 37.10 (L) 11/06/2018   LDLCALC 79 11/06/2018   LDLDIRECT 53 08/10/2009   TRIG 129.0 11/06/2018   CHOLHDL 4 11/06/2018   Lab Results  Component Value Date   CREATININE 1.27 (H) 10/10/2019   BUN 10 10/10/2019   NA 139 10/10/2019   K 4.4 10/10/2019   CL 106 10/10/2019   CO2 24 10/10/2019    ASSESSMENT/PLAN    Problem List Items Addressed This Visit    Dilated cardiomyopathy ([place]) (Chronic)    Nonischemic/arrhythmogenic cardiomyopathy related to PVCs as her EF clearly improved with the initiation of amiodarone for PVC control.  Combination of rhythm control and appropriate medical management with carvedilol and Entresto at max doses along with spironolactone and Jardiance.      Relevant Orders   ECHOCARDIOGRAM COMPLETE   Frequent PVCs (Chronic)    Still auscultated on exam but not nearly as frequent.  Remains on amiodarone and high-dose carvedilol.  Continue maintenance dose amiodarone as the PVCs seem to be a major cause for her low EF.  If unable to tolerate amiodarone in the future, would consider PVC ablation.      Dyspnea on exertion (Chronic)    I  think she is probably can have exertional dyspnea for a little while while she recovers from the COVID-19 infection.  She does not have any evidence of edema, her lungs are pretty clear and she is having no orthopnea at this point.  However I do want to confirm that she did not have any cardiac involvement with her COVID-19 infection.  She did have a modestly elevated BNP level.  Plan: Check 2D echocardiogram late December early January after adequate convalescence. Follow-up in April to reevaluate her blood pressure.      Relevant Orders   ECHOCARDIOGRAM COMPLETE   Chronic diastolic heart failure (HCC) of systolic heart failure - Primary (Chronic)    Systolic component has essentially resolved and now she has chronic diastolic heart failure/HFpEF with resolution of her cardiomyopathy.  However because of the improved EF she remains on an outstanding regimen including high-dose carvedilol and Entresto along with spironolactone.   With a combination of Entresto and Jardiance, she does not require diuretic.  Euvolemic on exam.  Plan for now is to recheck a 2D echocardiogram in late December, early January in order to reassess EF in light of her COVID-19 infection.  Need to exclude any cardiac component.  Waiting another  month will allow for more convalescence.  We will follow-up in 4 months in order to reassess symptoms and blood pressure.      Relevant Orders   ECHOCARDIOGRAM COMPLETE   Essential hypertension (Chronic)    Running but he had a day, and has been running little high.  For now progressively monitor and see what it looks like once we recheck her echocardiogram.  She is on previous max dose of amiodarone and Entresto as well as possibly spironolactone.  Would be reluctant to add another medication to confuse issues.          COVID-19 Education: The signs and symptoms of COVID-19 were discussed with the patient and how to seek care for testing (follow up with PCP or arrange  E-visit).   The importance of social distancing was discussed today.   I spent a total of 36 minutes with the patient and chart review. >  50% of the time was spent in direct patient consultation.  Additional time spent with chart review (studies, outside notes, etc): 10 Total Time: 80mn Prolonged time spent with the patient discussing her symptoms and the results of studies.  Current medicines are reviewed at length with the patient today.  (+/- concerns) she wanted to review the reason for all the medicines she is taking   Patient Instructions / Medication Changes & Studies & Tests Ordered   Patient Instructions  Medication Instructions:  No changes  *If you need a refill on your cardiac medications before your next appointment, please call your pharmacy*  Lab Work: Not needed  Testing/Procedures: Will be schedule at [facility] requested that you have an echocardiogram. Echocardiography is a painless test that uses sound waves to create images of your heart. It provides your doctor with information about the size and shape of your heart and how well your heart's chambers and valves are working. This procedure takes approximately one hour. There are no restrictions for this procedure.    Follow-Up: [facility] and your [facility] needs are our priority.  As part of our continuing mission to provide you with exceptional heart care, we have created designated Provider Care Teams.  These Care Teams include your primary Cardiologist (physician) and Advanced Practice Providers (APPs -  Physician Assistants and Nurse Practitioners) who all work together to provide you with the care you need, when you need it.  Your next appointment:   4 months- April 2021  The format for your next appointment:   In Person  Provider:   DGlenetta Hew MD  Other Instructions    Studies Ordered:   Orders Placed This Encounter  Procedures  .  ECHOCARDIOGRAM COMPLETE     DGlenetta Hew M.D., M.S. Interventional Cardiologist   Pager # [phone] # [phone] [address] [place] [address]  [facility] you [facility]

## 2019-10-29 ENCOUNTER — Encounter: Payer: Self-pay | Admitting: Cardiology

## 2019-10-29 NOTE — Assessment & Plan Note (Signed)
Running but he had a day, and has been running little high.  For now progressively monitor and see what it looks like once we recheck her echocardiogram.  She is on previous max dose of amiodarone and Entresto as well as possibly spironolactone.  Would be reluctant to add another medication to confuse issues.

## 2019-10-29 NOTE — Assessment & Plan Note (Signed)
Nonischemic/arrhythmogenic cardiomyopathy related to PVCs as her EF clearly improved with the initiation of amiodarone for PVC control.  Combination of rhythm control and appropriate medical management with carvedilol and Entresto at max doses along with spironolactone and Jardiance.

## 2019-10-29 NOTE — Assessment & Plan Note (Addendum)
Systolic component has essentially resolved and now she has chronic diastolic heart failure/HFpEF with resolution of her cardiomyopathy.  However because of the improved EF she remains on an outstanding regimen including high-dose carvedilol and Entresto along with spironolactone.   With a combination of Entresto and Jardiance, she does not require diuretic.  Euvolemic on exam.  Plan for now is to recheck a 2D echocardiogram in late December, early January in order to reassess EF in light of her COVID-19 infection.  Need to exclude any cardiac component.  Waiting another month will allow for more convalescence.  We will follow-up in 4 months in order to reassess symptoms and blood pressure.

## 2019-10-29 NOTE — Assessment & Plan Note (Signed)
Still auscultated on exam but not nearly as frequent.  Remains on amiodarone and high-dose carvedilol.  Continue maintenance dose amiodarone as the PVCs seem to be a major cause for her low EF.  If unable to tolerate amiodarone in the future, would consider PVC ablation.

## 2019-10-29 NOTE — Assessment & Plan Note (Signed)
I think she is probably can have exertional dyspnea for a little while while she recovers from the COVID-19 infection.  She does not have any evidence of edema, her lungs are pretty clear and she is having no orthopnea at this point.  However I do want to confirm that she did not have any cardiac involvement with her COVID-19 infection.  She did have a modestly elevated BNP level.  Plan: Check 2D echocardiogram late December early January after adequate convalescence. Follow-up in April to reevaluate her blood pressure.

## 2019-11-02 ENCOUNTER — Other Ambulatory Visit: Payer: Self-pay | Admitting: Family Medicine

## 2019-11-04 ENCOUNTER — Telehealth (HOSPITAL_COMMUNITY): Payer: Self-pay | Admitting: Pharmacy Technician

## 2019-11-04 NOTE — Telephone Encounter (Signed)
Electronic refill request. Tramadol Last office visit:   09/23/2019 Last Filled:   #[id number]   1 RF  04/14/2019 Please advise.

## 2019-11-04 NOTE — Telephone Encounter (Signed)
Spoke to patient regarding re-enrollment of Entresto in [employer]. Patient came and signed application but did not fill in [facility] size or income. Called and left her a voicemail to return my call with that information.   Will follow up.  Charlann Boxer, CPhT

## 2019-11-05 NOTE — Telephone Encounter (Signed)
Sent. Thanks.   

## 2019-11-06 ENCOUNTER — Encounter: Payer: Self-pay | Admitting: Family Medicine

## 2019-11-08 ENCOUNTER — Encounter: Payer: Self-pay | Admitting: Family Medicine

## 2019-11-10 ENCOUNTER — Other Ambulatory Visit: Payer: Self-pay | Admitting: Family Medicine

## 2019-11-10 DIAGNOSIS — E119 Type 2 diabetes mellitus without complications: Secondary | ICD-10-CM

## 2019-11-12 ENCOUNTER — Ambulatory Visit (INDEPENDENT_AMBULATORY_CARE_PROVIDER_SITE_OTHER): Payer: Medicare Other

## 2019-11-12 ENCOUNTER — Telehealth: Payer: Self-pay

## 2019-11-12 ENCOUNTER — Other Ambulatory Visit: Payer: Self-pay

## 2019-11-12 ENCOUNTER — Ambulatory Visit: Payer: Medicare Other

## 2019-11-12 ENCOUNTER — Other Ambulatory Visit (INDEPENDENT_AMBULATORY_CARE_PROVIDER_SITE_OTHER): Payer: Medicare Other

## 2019-11-12 DIAGNOSIS — Z0000 Encounter for general adult medical examination without abnormal findings: Secondary | ICD-10-CM | POA: Diagnosis not present

## 2019-11-12 DIAGNOSIS — E119 Type 2 diabetes mellitus without complications: Secondary | ICD-10-CM | POA: Diagnosis not present

## 2019-11-12 LAB — COMPREHENSIVE METABOLIC PANEL
ALT: 8 U/L (ref 0–35)
AST: 15 U/L (ref 0–37)
Albumin: 3.8 g/dL (ref 3.5–5.2)
Alkaline Phosphatase: 53 U/L (ref 39–117)
BUN: 14 mg/dL (ref 6–23)
CO2: 29 mEq/L (ref 19–32)
Calcium: 9.5 mg/dL (ref 8.4–10.5)
Chloride: 106 mEq/L (ref 96–112)
Creatinine, Ser: 1.2 mg/dL (ref 0.40–1.20)
GFR: 52.41 mL/min — ABNORMAL LOW (ref >60.00)
Glucose, Bld: 98 mg/dL (ref 70–99)
Potassium: 4.3 mEq/L (ref 3.5–5.1)
Sodium: 142 mEq/L (ref 135–145)
Total Bilirubin: 0.5 mg/dL (ref 0.2–1.2)
Total Protein: 6.8 g/dL (ref 6.0–8.3)

## 2019-11-12 LAB — LIPID PANEL
Cholesterol: 142 mg/dL (ref 0–200)
HDL: 39.8 mg/dL (ref >39.00)
LDL Cholesterol: 85 mg/dL (ref 0–99)
NonHDL: 102.48
Total CHOL/HDL Ratio: 4
Triglycerides: 86 mg/dL (ref 0.0–149.0)
VLDL: 17.2 mg/dL (ref 0.0–40.0)

## 2019-11-12 LAB — TSH: TSH: 1.87 u[IU]/mL (ref 0.35–4.50)

## 2019-11-12 LAB — HEMOGLOBIN A1C: Hgb A1c MFr Bld: 6.4 % (ref 4.6–6.5)

## 2019-11-12 NOTE — Patient Instructions (Signed)
[name] , Thank you for taking time to come for your Medicare Wellness Visit. I appreciate your ongoing commitment to your health goals. Please review the following plan we discussed and let me know if I can assist you in the future.   Screening recommendations/referrals: Colonoscopy: Up to date, completed 10/08/2013 Mammogram: will schedule appointment Bone Density: Up to date, completed 07/25/2018 Recommended yearly ophthalmology/optometry visit for glaucoma screening and checkup Recommended yearly dental visit for hygiene and checkup  Vaccinations: Influenza vaccine: Up to date, completed 08/05/2019 Pneumococcal vaccine: Completed series Tdap vaccine: decline Shingles vaccine: discuss with provider    Advanced directives: Please bring a copy of your POA (Power of Attorney) and/or Living Will to your next appointment.   Conditions/risks identified: diabetes, hypertension, hyperlipidemia  Next appointment: 11/14/2019 @ 2:15 pm    Preventive Care 65 Years and Older, Female Preventive care refers to lifestyle choices and visits with your health care provider that can promote health and wellness. What does preventive care include?  A yearly physical exam. This is also called an annual well check.  Dental exams once or twice a year.  Routine eye exams. Ask your health care provider how often you should have your eyes checked.  Personal lifestyle choices, including:  Daily care of your teeth and gums.  Regular physical activity.  Eating a healthy diet.  Avoiding tobacco and drug use.  Limiting alcohol use.  Practicing safe sex.  Taking low-dose aspirin every day.  Taking vitamin and mineral supplements as recommended by your health care provider. What happens during an annual well check? The services and screenings done by your health care provider during your annual well check will depend on your age, overall health, lifestyle risk factors, and family history of  disease. Counseling  Your health care provider may ask you questions about your:  Alcohol use.  Tobacco use.  Drug use.  Emotional well-being.  Home and relationship well-being.  Sexual activity.  Eating habits.  History of falls.  Memory and ability to understand (cognition).  Work and work Statistician.  Reproductive health. Screening  You may have the following tests or measurements:  Height, weight, and BMI.  Blood pressure.  Lipid and cholesterol levels. These may be checked every 5 years, or more frequently if you are over 62 years old.  Skin check.  Lung cancer screening. You may have this screening every year starting at age 52 if you have a 30-pack-year history of smoking and currently smoke or have quit within the past 15 years.  Fecal occult blood test (FOBT) of the stool. You may have this test every year starting at age 43.  Flexible sigmoidoscopy or colonoscopy. You may have a sigmoidoscopy every 5 years or a colonoscopy every 10 years starting at age 45.  Hepatitis C blood test.  Hepatitis B blood test.  Sexually transmitted disease (STD) testing.  Diabetes screening. This is done by checking your blood sugar (glucose) after you have not eaten for a while (fasting). You may have this done every 1-3 years.  Bone density scan. This is done to screen for osteoporosis. You may have this done starting at age 49.  Mammogram. This may be done every 1-2 years. Talk to your health care provider about how often you should have regular mammograms. Talk with your health care provider about your test results, treatment options, and if necessary, the need for more tests. Vaccines  Your health care provider may recommend certain vaccines, such as:  Influenza vaccine.  This is recommended every year.  Tetanus, diphtheria, and acellular pertussis (Tdap, Td) vaccine. You may need a Td booster every 10 years.  Zoster vaccine. You may need this after age 69.   Pneumococcal 13-valent conjugate (PCV13) vaccine. One dose is recommended after age 66.  Pneumococcal polysaccharide (PPSV23) vaccine. One dose is recommended after age 34. Talk to your health care provider about which screenings and vaccines you need and how often you need them. This information is not intended to replace advice given to you by your health care provider. Make sure you discuss any questions you have with your health care provider. Document Released: 12/25/2015 Document Revised: 08/17/2016 Document Reviewed: 09/29/2015 Elsevier Interactive Patient Education  2017 [place] Prevention in the Home Falls can cause injuries. They can happen to people of all ages. There are many things you can do to make your home safe and to help prevent falls. What can I do on the outside of my home?  Regularly fix the edges of walkways and driveways and fix any cracks.  Remove anything that might make you trip as you walk through a door, such as a raised step or threshold.  Trim any bushes or trees on the path to your home.  Use bright outdoor lighting.  Clear any walking paths of anything that might make someone trip, such as rocks or tools.  Regularly check to see if handrails are loose or broken. Make sure that both sides of any steps have handrails.  Any raised decks and porches should have guardrails on the edges.  Have any leaves, snow, or ice cleared regularly.  Use sand or salt on walking paths during winter.  Clean up any spills in your garage right away. This includes oil or grease spills. What can I do in the bathroom?  Use night lights.  Install grab bars by the toilet and in the tub and shower. Do not use towel bars as grab bars.  Use non-skid mats or decals in the tub or shower.  If you need to sit down in the shower, use a plastic, non-slip stool.  Keep the floor dry. Clean up any water that spills on the floor as soon as it happens.  Remove soap  buildup in the tub or shower regularly.  Attach bath mats securely with double-sided non-slip rug tape.  Do not have throw rugs and other things on the floor that can make you trip. What can I do in the bedroom?  Use night lights.  Make sure that you have a light by your bed that is easy to reach.  Do not use any sheets or blankets that are too big for your bed. They should not hang down onto the floor.  Have a firm chair that has side arms. You can use this for support while you get dressed.  Do not have throw rugs and other things on the floor that can make you trip. What can I do in the kitchen?  Clean up any spills right away.  Avoid walking on wet floors.  Keep items that you use a lot in easy-to-reach places.  If you need to reach something above you, use a strong step stool that has a grab bar.  Keep electrical cords out of the way.  Do not use floor polish or wax that makes floors slippery. If you must use wax, use non-skid floor wax.  Do not have throw rugs and other things on the floor that can make  you trip. What can I do with my stairs?  Do not leave any items on the stairs.  Make sure that there are handrails on both sides of the stairs and use them. Fix handrails that are broken or loose. Make sure that handrails are as long as the stairways.  Check any carpeting to make sure that it is firmly attached to the stairs. Fix any carpet that is loose or worn.  Avoid having throw rugs at the top or bottom of the stairs. If you do have throw rugs, attach them to the floor with carpet tape.  Make sure that you have a light switch at the top of the stairs and the bottom of the stairs. If you do not have them, ask someone to add them for you. What else can I do to help prevent falls?  Wear shoes that:  Do not have high heels.  Have rubber bottoms.  Are comfortable and fit you well.  Are closed at the toe. Do not wear sandals.  If you use a stepladder:  Make  sure that it is fully opened. Do not climb a closed stepladder.  Make sure that both sides of the stepladder are locked into place.  Ask someone to hold it for you, if possible.  Clearly mark and make sure that you can see:  Any grab bars or handrails.  First and last steps.  Where the edge of each step is.  Use tools that help you move around (mobility aids) if they are needed. These include:  Canes.  Walkers.  Scooters.  Crutches.  Turn on the lights when you go into a dark area. Replace any light bulbs as soon as they burn out.  Set up your furniture so you have a clear path. Avoid moving your furniture around.  If any of your floors are uneven, fix them.  If there are any pets around you, be aware of where they are.  Review your medicines with your doctor. Some medicines can make you feel dizzy. This can increase your chance of falling. Ask your doctor what other things that you can do to help prevent falls. This information is not intended to replace advice given to you by your health care provider. Make sure you discuss any questions you have with your health care provider. Document Released: 09/24/2009 Document Revised: 05/05/2016 Document Reviewed: 01/02/2015 Elsevier Interactive Patient Education  2017 [employer].

## 2019-11-12 NOTE — Progress Notes (Signed)
Subjective:   Natasha Woodard is a 78 y.o. female who presents for Medicare Annual (Subsequent) preventive examination.  Review of Systems: N/A   This visit is being conducted through telemedicine via telephone at the nurse health advisor's home address due to the COVID-19 pandemic. This patient has given me verbal consent via doximity to conduct this visit, patient states they are participating from their home address. Patient and myself are on the telephone call. There is no referral for this visit. Some vital signs may be absent or patient reported.    Patient identification: identified by name, DOB, and current address   Cardiac Risk Factors include: advanced age (>23mn, >>57women);diabetes mellitus;dyslipidemia;hypertension     Objective:     Vitals: There were no vitals taken for this visit.  There is no height or weight on file to calculate BMI.  Advanced Directives 11/12/2019 10/10/2019 11/15/2018 11/06/2018 10/16/2017 09/26/2017 07/20/2017  Does Patient Have a Medical Advance Directive? Yes No _0   Type of AParamedicof [place]val[place]Living will - - [place] will [place] will Healthcare Power of [place] will  Does patient want to make changes to medical advance directive? - - No - Patient declined - No - Patient declined No - Patient declined -  Copy of HGlen Elderin Chart? No - copy requested - - No - copy requested - No - copy requested No - copy requested  Would patient like information on creating a medical advance directive? - No - Patient declined - - - - -    Tobacco Social History   Tobacco Use  Smoking Status Former Smoker  . Years: 28.00  . Types: Cigarettes  . Quit date: 12/12/1993  . Years since quitting: 25.9  Smokeless Tobacco Never Used     Counseling given: Not Answered   Clinical Intake:  Pre-visit  preparation completed: Yes  Pain : No/denies pain     Nutritional Risks: None Diabetes: Yes CBG done?: No Did pt. bring in CBG monitor from home?: No  How often do you need to have someone help you when you read instructions, pamphlets, or other written materials from your doctor or pharmacy?: 1 - Never What is the last grade level you completed in school?: college graduate  Interpreter Needed?: No  Information entered by :: CJohnson, LPN  Past Medical History:  Diagnosis Date  . Calcific tendonitis 2008   Treatment of left leg  . Cardiomyopathy ([place] 06/2017   a) Echo: EF 25 and 30%. GR 1 DD. Mild DD with elevated LVEDP. Mild valvular disease.; b) Cardiac MRI 2/'19: frequent PVCs (diffiuclt to interpret) - EF ~27% w/ diffuse HK.  No evidence of infarct, infiltrative Dz or myocarditis. -- ? if related to PVCs. c) f/u Echo 6/'19: EF 35-40%. Gr 1 DD. Diffuse HK.-> d) Jan 2020 EF 50 to 55%.  Mod MAC, mod LA dilation.  Aortic sclerosis  . Chronic combined systolic and diastolic CHF, NYHA class 2 and ACA/AHA stage C    Now essentially resolved, back to simply diastolic CHF  . Coronary artery disease, non-occlusive    mild-moderate CAD 06/30/17 cath  . CTS (carpal tunnel syndrome)   . Diabetes mellitus    Type II  . Dysrhythmia    patient said that she cant remember what it is  . Frequent unifocal PVCs 01/2018   Event monitor showed normal sinus rhythm with frequent multifocal  PVCs (9%) and PACs.  Nighttime bradycardia suggestive of OSA.  Marland Kitchen Hiatal hernia    with reflux  . Hyperlipidemia    Statin intolerant  . Hypertension   . Ichthyosis congenita   . NSVD (normal spontaneous vaginal delivery) 1971 & 1972  . Obesity   . Plantar fasciitis    Left  . Pulmonary nodule    Imaged multiple times and benign appearing  . Wears glasses    Past Surgical History:  Procedure Laterality Date  . ABDOMINAL HYSTERECTOMY  1975   TAH-BSO  . ABSCESS DRAINAGE  1972   right breast  .  BREAST BIOPSY Left 01/14/2009   Stereo- Benign  . CARDIAC MRI  01/2018   Difficult to interpret 2/2 PVCs. Normal LV size - EF ~27% with diffuse HK. Mild RV dilation - normal fxn. -- NO MYOCARDIAL LGI => no definitive evidence of prior MI, Infiltrative Dz or Myocarditis -- suspect NICM, possibly related to PVCs.  . CARPAL TUNNEL RELEASE Right 07/02/2014   Procedure: RIGHT CARPAL TUNNEL RELEASE;  Surgeon: Wynonia Sours, MD;  Location: [facility];  Service: Orthopedics;  Laterality: Right;  . CARPAL TUNNEL RELEASE Left 06/11/2015   Procedure: LEFT CARPAL TUNNEL RELEASE;  Surgeon: Daryll Brod, MD;  Location: [facility];  Service: Orthopedics;  Laterality: Left;  REGIONAL/FAB  . COLONOSCOPY    . corn removal  1968   right  . Holter Monitor  06/2017   ~17,000 PVC beats - majority were singlets, some couplets. 44 brief 3-7 beat runs of NSVT. Also noted were less frequent PACs with 11 runs (longest 15 beats)  . OPEN REDUCTION INTERNAL FIXATION (ORIF) DISTAL RADIAL FRACTURE Left 07/21/2017   Procedure: OPEN REDUCTION INTERNAL FIXATION (ORIF) LEFT DISTAL RADIAL FRACTURE;  Surgeon: Renette Butters, MD;  Location: [facility];  Service: Orthopedics;  Laterality: Left;  . RIGHT/LEFT HEART CATH AND CORONARY ANGIOGRAPHY N/A 06/30/2017   Procedure: Right/Left Heart Cath and Coronary Angiography;  Surgeon: Leonie Man, MD;  Location[facility] INVASIVE CV LAB:  pRCA 55%, pCx 40%, OM1 45%, mCx 50%, D2 50% - LVEF 25-35%. Moderately elevated LVEDP(26 mmHg with PCWP 16 mmHg).  FICK CO/CI: 4.47/2.48. PA pressures 47/14 mmHg with a mean of 27 mm.  Marland Kitchen ROTATOR CUFF REPAIR     left shoulder  . TRANSTHORACIC ECHOCARDIOGRAM  06/2017   EF 25 and 30%. GR 1 DD. Mild diastolic dysfunction with elevated LVEDP. Mild valvular disease.  . TRANSTHORACIC ECHOCARDIOGRAM  11'18, 6/'19    a) EF remains 30-35%.  Diffuse hypokinesis noted.  Severe LA dilation; b) Improved EF 35-40%.  Diffuse HK.  GR 1 DD.  Moderate TR.   Mild RV dilation.  . TRANSTHORACIC ECHOCARDIOGRAM  01/09/2019   EF 50 to 55%.  Impaired relaxation.  Notable improvement of LV function.  Moderate LA dilation.  Moderate MAC.  Aortic sclerosis with no stenosis.  Marland Kitchen ZIO Patch 14 d Event Monitor  10/2018   Results as below <1% PVC.  (Notably improved after starting amiodarone)   Family History  Problem Relation Age of Onset  . Heart disease Mother        s/p pacemaker  . Diabetes Mother   . Heart disease Father   . Diabetes Father   . Heart disease Brother        MI  . Cancer Brother        Lung  . Hypertension Other   . Colon cancer Neg Hx   . Breast  cancer Neg Hx    Social History   Socioeconomic History  . Marital status: Married    Spouse name: Not on file  . Number of children: 2  . Years of education: Not on file  . Highest education level: Not on file  Occupational History    Employer: RETIRED  Social Needs  . Financial resource strain: Not hard at all  . Food insecurity    Worry: Never true    Inability: Never true  . Transportation needs    Medical: No    Non-medical: No  Tobacco Use  . Smoking status: Former Smoker    Years: 28.00    Types: Cigarettes    Quit date: 12/12/1993    Years since quitting: 25.9  . Smokeless tobacco: Never Used  Substance and Sexual Activity  . Alcohol use: No    Alcohol/week: 0.0 standard drinks  . Drug use: No  . Sexual activity: Never  Lifestyle  . Physical activity    Days per week: 3 days    Minutes per session: 60 min  . Stress: Not at all  Relationships  . Social Herbalist on phone: Not on file    Gets together: Not on file    Attends religious service: Not on file    Active member of club or organization: Not on file    Attends meetings of clubs or organizations: Not on file    Relationship status: Not on file  Other Topics Concern  . Not on file  Social History Narrative   Two kids, two grandchildren, two great grandchildren.    Married 1969    Retired.     Outpatient Encounter Medications as of 11/12/2019  Medication Sig  . amiodarone (PACERONE) 200 MG tablet TAKE 1/2 TABLET BY MOUTH ONCE DAILY  . aspirin 81 MG tablet Take 1 tablet (81 mg total) by mouth daily.  . carvedilol (COREG) 25 MG tablet TAKE 1 TABLET BY MOUTH TWICE DAILY  . Cholecalciferol (VITAMIN D) 1000 UNITS capsule Take 1,000 Units by mouth daily.   . empagliflozin (JARDIANCE) 10 MG TABS tablet Take 5 mg by mouth daily before breakfast.  . glucose blood (ACCU-CHEK AVIVA PLUS) test strip USE TO TEST BLOOD SUGAR ONCE DAILY FOR DM E11.9  . Lancets (ACCU-CHEK MULTICLIX) lancets USE ONE LANCET TO TEST BLOOD GLUCOSE DAILY FOR DM 250.00  . lovastatin (MEVACOR) 40 MG tablet TAKE 1 TABLET BY MOUTH AT BEDTIME  . metFORMIN (GLUCOPHAGE) 500 MG tablet TAKE HALF TABLET BY MOUTH TWICE DAILY WITH A MEAL  . sacubitril-valsartan (ENTRESTO) 97-103 MG Take 1 tablet by mouth 2 (two) times daily.  Marland Kitchen spironolactone (ALDACTONE) 25 MG tablet TAKE 1/2 TABLET BY MOUTH ONCE DAILY  . traMADol (ULTRAM) 50 MG tablet TAKE 1 TABLET(50 MG) BY MOUTH EVERY 12 HOURS AS NEEDED   No facility-administered encounter medications on file as of 11/12/2019.     Activities of Daily Living In your present state of health, do you have any difficulty performing the following activities: 11/12/2019  Hearing? Y  Comment wears hearing aid  Vision? N  Difficulty concentrating or making decisions? N  Walking or climbing stairs? Y  Comment fatigue and shortness of breath  Dressing or bathing? N  Doing errands, shopping? N  Preparing Food and eating ? N  Using the Toilet? N  In the past six months, have you accidently leaked urine? Y  Comment wears a pad  Do you have problems with loss of bowel  control? N  Managing your Medications? N  Managing your Finances? N  Housekeeping or managing your Housekeeping? N  Some recent data might be hidden    Patient Care Team: Tonia Ghent, MD as PCP - General (Family  Medicine) Leonie Man, MD as PCP - Cardiology (Cardiology)    Assessment:   This is a routine wellness examination for Espy.  Exercise Activities and Dietary recommendations Current Exercise Habits: Structured exercise class, Type of exercise: Other - see comments(water aerobics), Time (Minutes): 60, Frequency (Times/Week): 3, Weekly Exercise (Minutes/Week): 180, Intensity: Moderate, Exercise limited by: None identified  Goals    . Increase physical activity     Starting 11/06/2018, I will continue to exercise for at least 60 min 3 days per week.     . Patient Stated     11/12/2019, I will maintain and continue medications as prescribed.        Fall Risk Fall Risk  11/12/2019 11/15/2018 11/06/2018 11/06/2017 10/16/2017  Falls in the past year? 0 0 0 Yes Yes  Number falls in past yr: 0 - - 1 1  Injury with Fall? 0 - - Yes Yes  Risk Factor Category  - - - - High Fall Risk  Risk for fall due to : Medication side effect - - - History of fall(s)  Follow up Falls evaluation completed;Falls prevention discussed - - - Falls prevention discussed   Is the patient's home free of loose throw rugs in walkways, pet beds, electrical cords, etc?   yes      Grab bars in the bathroom? no      Handrails on the stairs?   yes      Adequate lighting?   yes  Timed Get Up and Go performed: N/A  Depression Screen PHQ 2/9 Scores 11/12/2019 11/15/2018 11/06/2018 11/06/2017  PHQ - 2 Score 0 0 0 0  PHQ- 9 Score 0 - - -     Cognitive Function MMSE - Mini Mental State Exam 11/12/2019 11/06/2018 09/15/2016  Orientation to time _0 Orientation to Place _1 Registration _2 Attention/ Calculation 5 0 0  Recall _3 Language- name 2 objects - 0 0  Language- repeat _4 Language- follow 3 step command - 3 3  Language- read & follow direction - 0 0  Write a sentence - 0 0  Copy design - 0 0  Total score - 20 20  Mini Cog  Mini-Cog screen was completed. Maximum score is 22. A value of  0 denotes this part of the MMSE was not completed or the patient failed this part of the Mini-Cog screening.       Immunization History  Administered Date(s) Administered  . Influenza Whole 08/12/2010  . Influenza,inj,Quad PF,6+ Mos 08/19/2014, 08/15/2015, 09/02/2016, 08/05/2019  . Influenza-Unspecified 09/11/2018  . Pneumococcal Conjugate-13 03/10/2015  . Pneumococcal Polysaccharide-23 05/26/2006  . Td 06/14/2007  . Zoster 08/10/2006    Qualifies for Shingles Vaccine? Yes  Screening Tests Health Maintenance  Topic Date Due  . OPHTHALMOLOGY EXAM  10/18/2019  . TETANUS/TDAP  12/12/2019 (Originally 06/13/2017)  . FOOT EXAM  11/13/2019  . HEMOGLOBIN A1C  02/05/2020  . INFLUENZA VACCINE  Completed  . DEXA SCAN  Completed  . PNA vac Low Risk Adult  Completed    Cancer Screenings: Lung: Low Dose CT Chest recommended if Age 58-80 years, 30 pack-year currently smoking OR have quit w/in 15years. Patient does  not qualify. Breast:  Up to date on Mammogram? No, Patient will schedule appointment  Up to date of Bone Density/Dexa? Yes, completed 07/25/2018 Colorectal: completed 10/08/2013  Additional Screenings:  Hepatitis C Screening: N/A     Plan:    Patient will maintain and continue medications as prescribed.    I have personally reviewed and noted the following in the patient's chart:   . Medical and social history . Use of alcohol, tobacco or illicit drugs  . Current medications and supplements . Functional ability and status . Nutritional status . Physical activity . Advanced directives . List of other physicians . Hospitalizations, surgeries, and ER visits in previous 12 months . Vitals . Screenings to include cognitive, depression, and falls . Referrals and appointments  In addition, I have reviewed and discussed with patient certain preventive protocols, quality metrics, and best practice recommendations. A written personalized care plan for preventive services as  well as general preventive health recommendations were provided to patient.     Andrez Grime, LPN  02/14/2480

## 2019-11-12 NOTE — Progress Notes (Signed)
PCP notes:  Health Maintenance: Eye exam scheduled Feb 2021  Wants to discuss Shingrix with provider  Patient will schedule mammogram appointment   Abnormal Screenings: none   Patient concerns: none   Nurse concerns: none   Next PCP appt.: 11/14/2019 @ 2:15 pm

## 2019-11-12 NOTE — Telephone Encounter (Signed)
[place] Night - Client TELEPHONE ADVICE RECORD [facility] Patient Name: Natasha Woodard Gender: Female DOB: 08-Jul-1941 Age: 78 Y 2 M 16 D Return Phone Number: [phone] (Primary) Address: City/State/Zip: [place] [address] Client [place] Night - Client Client Site [facility] Physician Renford Dills - MD Contact Type Call Who Is Calling Patient / Member / Family / Caregiver Call Type Triage / Clinical Relationship To Patient Self Return Phone Number [phone] (Primary) Chief Complaint Medical Device, Procedure and Surgery Questions (non symptomatic) Reason for Call Symptomatic / Request for [place] has an appointment for lab work today. Caller wants to know if she should fast. Translation No Nurse Assessment Guidelines Guideline Title Affirmed Question Affirmed Notes Nurse Date/Time (Eastern Time) Disp. Time Eilene Ghazi Time) Disposition Final User 11/12/2019 7:44:25 AM Attempt made - message left Dew, RN, Levada Dy 11/12/2019 8:03:26 AM Attempt made - message left Dew, RN, Levada Dy 11/12/2019 8:31:46 AM Clinical Call Yes Lucky Cowboy, RN, Levada Dy Comments User: Raford Pitcher, RN Date/Time Eilene Ghazi Time): 11/12/2019 8:31:03 AM Caller wanted to know if she was ok to eat breakfast before her lab appointment at 915 am. Ins her to just have water in case it was fasting labs since she didn't know what was being done. User: Raford Pitcher, RN Date/Time Eilene Ghazi Time): 11/12/2019 8:31:14 AM Declined triage.

## 2019-11-14 ENCOUNTER — Ambulatory Visit (INDEPENDENT_AMBULATORY_CARE_PROVIDER_SITE_OTHER): Payer: Medicare Other | Admitting: Family Medicine

## 2019-11-14 ENCOUNTER — Encounter: Payer: Self-pay | Admitting: Family Medicine

## 2019-11-14 ENCOUNTER — Other Ambulatory Visit: Payer: Self-pay

## 2019-11-14 VITALS — BP 142/76 | HR 64 | Temp 97.1°F | Ht 62.0 in | Wt 173.1 lb

## 2019-11-14 DIAGNOSIS — I10 Essential (primary) hypertension: Secondary | ICD-10-CM | POA: Diagnosis not present

## 2019-11-14 DIAGNOSIS — E782 Mixed hyperlipidemia: Secondary | ICD-10-CM

## 2019-11-14 DIAGNOSIS — I5042 Chronic combined systolic (congestive) and diastolic (congestive) heart failure: Secondary | ICD-10-CM

## 2019-11-14 DIAGNOSIS — E119 Type 2 diabetes mellitus without complications: Secondary | ICD-10-CM | POA: Diagnosis not present

## 2019-11-14 DIAGNOSIS — M2550 Pain in unspecified joint: Secondary | ICD-10-CM

## 2019-11-14 DIAGNOSIS — Z0000 Encounter for general adult medical examination without abnormal findings: Secondary | ICD-10-CM

## 2019-11-14 DIAGNOSIS — Z7189 Other specified counseling: Secondary | ICD-10-CM

## 2019-11-14 MED ORDER — METFORMIN HCL 500 MG PO TABS: ORAL_TABLET | ORAL | Status: DC

## 2019-11-14 NOTE — Patient Instructions (Addendum)
Check with your insurance to see if they will cover the shingles shot. Take care.  Glad to see you.   Don't change your meds for now.  Update me as needed.  Recheck in about 6 months with labs at the visit.

## 2019-11-14 NOTE — Progress Notes (Signed)
This visit occurred during the SARS-CoV-2 public health emergency.  Safety protocols were in place, including screening questions prior to the visit, additional usage of staff PPE, and extensive cleaning of exam room while observing appropriate contact time as indicated for disinfecting solutions.   Colonoscopy: Up to date, completed 10/08/2013 Mammogram: she wanted to wait given covid.   Bone Density: Up to date, completed 07/25/2018 Influenza vaccine: Up to date, completed 08/05/2019 Pneumococcal vaccine: Completed series Tdap vaccine:  Discussed with patient. Shingles vaccine: discuss with provider, see avs.   Advance directive- husband designated if patient were incapacitated.  Diabetes:  Using medications without difficulties: yes Hypoglycemic episodes:no Hyperglycemic episodes:no Feet problems:no Blood Sugars averaging: 80-100 usually.  eye exam within last year: f/u pending.   A1c controlled.  No ADE on meds.     CHF/Hypertension:    Using medication without problems or lightheadedness: yes Chest pain with exertion:no Edema:no Short of breath: mildly, but still exercising.   Her exercise capacity got worse with covid but is improving from her previous nadir.  She is not yet back to her previous baseline but is improving. Cr stable, d/w pt.    Elevated Cholesterol: Using medications without problems: yes Muscle aches: some aches but tolerable.  Less aches if she misses a dose of statin.   Diet compliance: yes Exercise:yes  Prev Covid illness d/w pt.  She is back on tramadol and tolerating that w/o ADE.  It helps her joint pain.  It is likely that the previous hallucinations she had were related to Covid and not tramadol.  Discussed.  She agrees.  PMH and SH reviewed  Meds, vitals, and allergies reviewed.   ROS: Per HPI unless specifically indicated in ROS section   GEN: nad, alert and oriented HEENT: ncat NECK: supple w/o LA CV: rrr. PULM: ctab, no inc wob ABD:  soft, +bs EXT: no edema SKIN: Chronic skin changes noted at baseline.  Diabetic foot exam: Normal inspection No skin breakdown No calluses  Normal DP pulses Normal sensation to light touch and monofilament Nails normal

## 2019-11-17 NOTE — Assessment & Plan Note (Addendum)
A1c controlled.  No ADE on meds.    Continue as is.  Recheck periodically.  She agrees.  See after visit summary.  >25 minutes spent in face to face time with patient, >50% spent in counselling or coordination of care

## 2019-11-17 NOTE — Assessment & Plan Note (Signed)
Continue statin.  She will update me as needed.  Labs discussed with patient.  She agrees with plan.

## 2019-11-17 NOTE — Assessment & Plan Note (Signed)
Prev Covid illness d/w pt.  She is back on tramadol and tolerating that w/o ADE.  It helps her joint pain.  It is likely that the previous hallucinations she had were related to Covid and not tramadol.  Discussed.  She agrees.

## 2019-11-17 NOTE — Assessment & Plan Note (Signed)
Her exercise capacity got worse with covid but is improving from her previous nadir.  She is not yet back to her previous baseline but is improving. Cr stable, d/w pt. continue as is.  She agrees.

## 2019-11-17 NOTE — Assessment & Plan Note (Signed)
Her exercise capacity got worse with covid but is improving from her previous nadir.  She is not yet back to her previous baseline but is improving. Cr stable, d/w pt. continue as is.  She agrees. 

## 2019-11-17 NOTE — Assessment & Plan Note (Signed)
Advance directive- husband designated if patient were incapacitated.  

## 2019-11-17 NOTE — Assessment & Plan Note (Signed)
Colonoscopy: Up to date, completed 10/08/2013 Mammogram: she wanted to wait given covid.   Bone Density: Up to date, completed 07/25/2018 Influenza vaccine: Up to date, completed 08/05/2019 Pneumococcal vaccine: Completed series Tdap vaccine:  Discussed with patient. Shingles vaccine: discuss with provider, see avs.   Advance directive- husband designated if patient were incapacitated.

## 2019-11-19 NOTE — Telephone Encounter (Signed)
[employer] and they received all documentation. They will review renewals in January.  Will follow up.  Charlann Boxer, CPhT

## 2019-11-22 ENCOUNTER — Encounter: Payer: Self-pay | Admitting: Family Medicine

## 2019-11-26 ENCOUNTER — Other Ambulatory Visit: Payer: Self-pay | Admitting: Family Medicine

## 2019-11-26 MED ORDER — SHINGRIX 50 MCG/0.5ML IM SUSR: 0.5000 mL | Freq: Once | INTRAMUSCULAR | 1 refills | Status: AC

## 2019-12-03 ENCOUNTER — Telehealth: Payer: Self-pay

## 2019-12-03 NOTE — Telephone Encounter (Signed)
[facility] Night - Client TELEPHONE ADVICE RECORD [facility] Patient Name: Natasha Woodard Gender: Female DOB: 05-Sep-1941 Age: 78 Y 3 M 7 D Return Phone Number: [phone] (Primary) Address: City/State/Zip: [place] [address] Client [place] Night - Client Client Site [facility] Physician Renford Dills - MD Contact Type Call Who Is Calling Patient / Member / Family / Caregiver Call Type Triage / Clinical Relationship To Patient Self Return Phone Number [phone] (Primary) Chief Complaint Health information question (non symptomatic) Reason for Call Symptomatic / Request for [place] states she is asking about when she could get the Covid-19 vaccine. Caller had the Shingles shot recently. Translation No Nurse Assessment Nurse: Windle Guard, RN, Olin Hauser Date/Time (Eastern Time): 12/03/2019 8:52:40 AM Confirm and document reason for call. If symptomatic, describe symptoms. ---Caller states she is asking about when she could get the Covid-19 vaccine. States she had the Shingles shot recently. Declines triage. Has the patient had close contact with a person known or suspected to have the novel coronavirus illness OR traveled / lives in area with major community spread (including international travel) in the last 14 days from the onset of symptoms? * If Asymptomatic, screen for exposure and travel within the last 14 days. ---No Does the patient have any new or worsening symptoms? ---No Please document clinical information provided and list any resource used. ---Caller transferred to office line. Guidelines Guideline Title Affirmed Question Affirmed Notes Nurse Date/Time (Eastern Time) Disp. Time Eilene Ghazi Time) Disposition Final User 12/03/2019 8:35:29 AM Attempt made - message left Windle Guard, RN, Olin Hauser 12/03/2019 8:55:36 AM Clinical Call Yes Conner, RN, Olin Hauser

## 2019-12-04 NOTE — Telephone Encounter (Signed)
Please update patient.  We do not have very many doses of the Covid vaccine in [facility].  We have no doses at the clinic.  I would not expect it to be available to her in the very near future (meaning in the next month or so).  I do not think the shingles vaccine is going to have any influence on her getting the Covid vaccine.  Thanks.

## 2019-12-04 NOTE — Telephone Encounter (Signed)
Patient advised and verbalized understanding 

## 2019-12-04 NOTE — Telephone Encounter (Signed)
Left message for patient to call back  

## 2019-12-11 ENCOUNTER — Other Ambulatory Visit (HOSPITAL_COMMUNITY): Payer: Medicare Other

## 2019-12-17 ENCOUNTER — Telehealth (HOSPITAL_COMMUNITY): Payer: Self-pay | Admitting: Pharmacist

## 2019-12-17 NOTE — Telephone Encounter (Signed)
Advanced Heart Failure Patient Advocate Encounter   Patient was approved to receive Entresto from [employer]. Left VM for patient.   Patient ID: [id number] Effective dates: 12/17/19 through 12/11/20  Karle Plumber, PharmD, BCPS, BCCP, CPP Heart Failure Clinic Pharmacist [phone]

## 2019-12-24 ENCOUNTER — Telehealth (HOSPITAL_COMMUNITY): Payer: Self-pay | Admitting: Pharmacist

## 2019-12-24 MED ORDER — JARDIANCE 10 MG PO TABS: 5.0000 mg | ORAL_TABLET | Freq: Every day | ORAL | 11 refills | Status: DC

## 2019-12-24 NOTE — Telephone Encounter (Signed)
Sent empagliflozin prescription to [facility] per patient request. Copay is $47.00.  Karle Plumber, PharmD, BCPS, BCCP, CPP Heart Failure Clinic Pharmacist [phone]

## 2019-12-25 ENCOUNTER — Other Ambulatory Visit: Payer: Self-pay

## 2019-12-25 ENCOUNTER — Ambulatory Visit (HOSPITAL_COMMUNITY): Payer: Medicare Other | Attending: Cardiology

## 2019-12-25 DIAGNOSIS — R0600 Dyspnea, unspecified: Secondary | ICD-10-CM | POA: Insufficient documentation

## 2019-12-25 DIAGNOSIS — R0609 Other forms of dyspnea: Secondary | ICD-10-CM

## 2019-12-25 DIAGNOSIS — I5042 Chronic combined systolic (congestive) and diastolic (congestive) heart failure: Secondary | ICD-10-CM | POA: Diagnosis not present

## 2019-12-25 DIAGNOSIS — I420 Dilated cardiomyopathy: Secondary | ICD-10-CM | POA: Diagnosis not present

## 2020-01-01 ENCOUNTER — Telehealth (HOSPITAL_COMMUNITY): Payer: Self-pay | Admitting: Pharmacist

## 2020-01-01 NOTE — Telephone Encounter (Signed)
Provided patient with Entresto samples as her shipment from [employer] will not come in until Friday.   Medication: Sherryll Burger 68/59 Quantity: 28 tabs (1 bottle)  Lot: [id number] Expiration date: 05/2021  Dropped samples off at front desk for patient.   Karle Plumber, PharmD, BCPS, CPP Heart Failure Clinic Pharmacist [phone]

## 2020-01-23 DIAGNOSIS — H40023 Open angle with borderline findings, high risk, bilateral: Secondary | ICD-10-CM | POA: Diagnosis not present

## 2020-01-23 DIAGNOSIS — E119 Type 2 diabetes mellitus without complications: Secondary | ICD-10-CM | POA: Diagnosis not present

## 2020-01-23 DIAGNOSIS — H2513 Age-related nuclear cataract, bilateral: Secondary | ICD-10-CM | POA: Diagnosis not present

## 2020-01-23 DIAGNOSIS — H25013 Cortical age-related cataract, bilateral: Secondary | ICD-10-CM | POA: Diagnosis not present

## 2020-01-23 LAB — HM DIABETES EYE EXAM

## 2020-02-05 ENCOUNTER — Encounter: Payer: Self-pay | Admitting: Surgery

## 2020-03-01 ENCOUNTER — Encounter: Payer: Self-pay | Admitting: Family Medicine

## 2020-03-05 ENCOUNTER — Encounter: Payer: Self-pay | Admitting: Cardiology

## 2020-03-05 ENCOUNTER — Other Ambulatory Visit: Payer: Self-pay

## 2020-03-05 ENCOUNTER — Ambulatory Visit: Payer: Medicare Other | Admitting: Cardiology

## 2020-03-05 VITALS — BP 117/60 | HR 64 | Ht 62.0 in | Wt 174.0 lb

## 2020-03-05 DIAGNOSIS — I5042 Chronic combined systolic (congestive) and diastolic (congestive) heart failure: Secondary | ICD-10-CM

## 2020-03-05 DIAGNOSIS — I10 Essential (primary) hypertension: Secondary | ICD-10-CM

## 2020-03-05 DIAGNOSIS — I493 Ventricular premature depolarization: Secondary | ICD-10-CM | POA: Diagnosis not present

## 2020-03-05 DIAGNOSIS — E782 Mixed hyperlipidemia: Secondary | ICD-10-CM | POA: Diagnosis not present

## 2020-03-05 DIAGNOSIS — E119 Type 2 diabetes mellitus without complications: Secondary | ICD-10-CM

## 2020-03-05 DIAGNOSIS — I420 Dilated cardiomyopathy: Secondary | ICD-10-CM | POA: Diagnosis not present

## 2020-03-05 NOTE — Patient Instructions (Signed)
Medication Instructions:  No Changes *If you need a refill on your cardiac medications before your next appointment, please call your pharmacy*   Lab Work:  Not needed   Testing/Procedures: Not  needed   Follow-Up: At [facility], you and your health needs are our priority.  As part of our continuing mission to provide you with exceptional heart care, we have created designated Provider Care Teams.  These Care Teams include your primary Cardiologist (physician) and Advanced Practice Providers (APPs -  Physician Assistants and Nurse Practitioners) who all work together to provide you with the care you need, when you need it.  We recommend signing up for the patient portal called "MyChart".  Sign up information is provided on this After Visit Summary.  MyChart is used to connect with patients for Virtual Visits (Telemedicine).  Patients are able to view lab/test results, encounter notes, upcoming appointments, etc.  Non-urgent messages can be sent to your provider as well.   To learn more about what you can do with MyChart, go to [email or web address].    Your next appointment:   6 month(s) Sept 2021  The format for your next appointment:   In Person  Provider:   Bryan Lemma, MD

## 2020-03-05 NOTE — Progress Notes (Signed)
Primary Care Provider: Tonia Ghent, MD Cardiologist: Glenetta Hew, MD  CHF Cardiologist: Dr. Haroldine Laws Electrophysiologist: None  Clinic Note: Chief Complaint  Patient presents with  . Follow-up    Echo result; almost fully recovered from Covid  . Cardiomyopathy    Appears totally recovered    HPI:    Natasha Woodard is a 79 y.o. female with a PMH notable for PVC related nonischemic/dilated cardiomyopathy (EF improved from 25 to 30% up to 60 to 65% by recent echo) who presents today for 13-monthfollow-up.   July 2018 diagnosed with dilated cardiomyopathy-EF 25 to 30%;   Also noted to have frequent PVCs-17%.  Started on carvedilol, Entresto and spironolactone   Right left heart cath showed no CAD, PCWP of 16 and LVEDP of 26 mmHg.  -> Referred to Dr. BHaroldine Lawswho initiated amiodarone to treat PVCs-> EF gradually progressed up to 35-40% by June 2019 and then by January 2020 was up to 55 to 55%.  Symptoms are essentially resolved.  (Amiodarone now maintenance dose 100 mg daily)  Lasix discontinued after initiation of Jardiance.  COVID-19 infection in October (09/26/2019) -quarantined at home, did not require hospitalization after ER visit on 10/10/2019 (noted intractable cough congestion weakness with diarrhea abdominal pain, but went to the ER because of visual hallucinations)-> never became hypoxic. ->  2 more weeks of quarantine.  Gradually regaining strength.  Natasha BOLINGERwas last seen on October 29, 2019 as she was recovering from her COVID-19.  She did indicate that she felt very sick during this timeframe and was very concerned.  Energy level is gradually improving.  Still easily fatigued.  Had not yet gotten back to do her full walk.  Still did not notice any significant edema.  Was not yet back to doing her exercise classes.  Recent Hospitalizations: None  Reviewed  CV studies:    The following studies were reviewed today: (if available, images/films  reviewed: From Epic Chart or Care Everywhere) . Echo January 2021: EF 60 to 65%.  Normal LV size and function.  GRII DD.  Mild to moderate MR.  Normal PA pressures.   Interval History:   Natasha DONDLINGERreturns here today overall doing really well.  She is now back to doing her working out.  Able to walk around the whole lap which is about a mile without stopping most days.  Sometimes she has to stop about halfway through to catch her breath for a second or 2 and then keeps going.  After that she will then go and do 30 minutes on the seated elliptical trainer.  She really does not notice significant exertional dyspnea.  She does not feel any palpitations.  She is ecstatic about the results of her echocardiogram.  She has not required any diuretic since starting Jardiance.  She is very self-deprecating and that she does not give herself any credit for her recovery indicating that was all because of good medicines.  I may be quite clear to her that she has done her part with taking the medications and also getting back into life with exercising.  She clearly is acting like someone whose ejection fraction is back to normal.  CV Review of Symptoms (Summary) Cardiovascular ROS: no chest pain or dyspnea on exertion negative for - edema, irregular heartbeat, orthopnea, palpitations, paroxysmal nocturnal dyspnea, rapid heart rate, shortness of breath or Syncope/near syncope TIA/amaurosis fugax, claudication  The patient does not have symptoms concerning for COVID-19 infection (fever, chills,  cough, or new shortness of breath). -->  Has fully recovered from her COVID-19 infection. The patient is practicing social distancing & Masking.    REVIEWED OF SYSTEMS   Review of Systems  Constitutional: Negative for malaise/fatigue (Energy level back to normal.) and weight loss (Has maintained stable weight since losing 10 pounds with Covid).  HENT: Negative for congestion and nosebleeds.   Respiratory:  Negative for cough (Barely has any cough now), shortness of breath and wheezing.   Gastrointestinal: Negative for blood in stool and melena.  Genitourinary: Negative for hematuria.  Musculoskeletal: Positive for joint pain. Negative for falls.  Neurological: Negative for dizziness, weakness and headaches.  Psychiatric/Behavioral: Negative for memory loss. The patient is nervous/anxious (Still very anxious, not sure if she should get her Covid shot.). The patient does not have insomnia.     I have reviewed and (if needed) personally updated the patient's problem list, medications, allergies, past medical and surgical history, social and family history.   PAST MEDICAL HISTORY   Past Medical History:  Diagnosis Date  . Calcific tendonitis 2008   Treatment of left leg  . Cardiomyopathy ([place]) 06/2017   a) Echo: EF 25-30%. GR 1-2 DD w/ elevated LVEDP. Mild valvular Dz; b) Cardiac MRI 2/'19: frequent PVCs (diffiuclt to interpret) - EF ~27% w/ diffuse HK.  No evidence of infarct, infiltrative Dz or myocarditis. -- ? if related to PVCs. c) f/u Echo 6/'19: EF 35-40%. Gr 1 DD. Diffuse HK.-> d) Jan 2020 EF 50 to 55%.  Mod MAC, mod LAE.  Ao Sclerosis; e) Echo 1/'21 - EF 60-65%, Gr II DD. Mild Mod MR.   . Chronic combined systolic and diastolic CHF, NYHA class 2 and ACA/AHA stage C    Now essentially resolved, back to simply diastolic CHF  . Coronary artery disease, non-occlusive    mild-moderate CAD 06/30/17 cath  . CTS (carpal tunnel syndrome)   . Diabetes mellitus    Type II  . Dysrhythmia    patient said that she cant remember what it is  . 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  Nighttime bradycardia suggestive of OSA.  Marland Kitchen Hiatal hernia    with reflux  . Hyperlipidemia    Statin intolerant  . Hypertension   . Ichthyosis congenita   . NSVD (normal spontaneous vaginal delivery) 1971 & 1972  . Obesity   . Plantar  fasciitis    Left  . Pulmonary nodule    Imaged multiple times and benign appearing  . Wears glasses     PAST SURGICAL HISTORY   Past Surgical History:  Procedure Laterality Date  . ABDOMINAL HYSTERECTOMY  1975   TAH-BSO  . ABSCESS DRAINAGE  1972   right breast  . BREAST BIOPSY Left 01/14/2009   Stereo- Benign  . CARDIAC MRI  01/2018   Difficult to interpret 2/2 PVCs. Normal LV size - EF ~27% with diffuse HK. Mild RV dilation - normal fxn. -- NO MYOCARDIAL LGI => no definitive evidence of prior MI, Infiltrative Dz or Myocarditis -- suspect NICM, possibly related to PVCs.  . CARPAL TUNNEL RELEASE Right 07/02/2014   Procedure: RIGHT CARPAL TUNNEL RELEASE;  Surgeon: Wynonia Sours, MD;  Location: [place];  Service: Orthopedics;  Laterality: Right;  . CARPAL TUNNEL RELEASE Left 06/11/2015   Procedure: LEFT CARPAL TUNNEL RELEASE;  Surgeon: Daryll Brod, MD;  Location: [place];  Service: Orthopedics;  Laterality: Left;  REGIONAL/FAB  . COLONOSCOPY    . corn removal  1968   right  . Holter Monitor  06/2017   ~17,000 PVC beats - majority were singlets, some couplets. 44 brief 3-7 beat runs of NSVT. Also noted were less frequent PACs with 11 runs (longest 15 beats)  . OPEN REDUCTION INTERNAL FIXATION (ORIF) DISTAL RADIAL FRACTURE Left 07/21/2017   Procedure: OPEN REDUCTION INTERNAL FIXATION (ORIF) LEFT DISTAL RADIAL FRACTURE;  Surgeon: Renette Butters, MD;  Location: [place];  Service: Orthopedics;  Laterality: Left;  . RIGHT/LEFT HEART CATH AND CORONARY ANGIOGRAPHY N/A 06/30/2017   Procedure: Right/Left Heart Cath and Coronary Angiography;  Surgeon: Leonie Man, MD;  Location: [facility] INVASIVE CV LAB:  pRCA 55%, pCx 40%, OM1 45%, mCx 50%, D2 50% - LVEF 25-35%. Moderately elevated LVEDP(26 mmHg with PCWP 16 mmHg).  FICK CO/CI: 4.47/2.48. PA pressures 47/14 mmHg with a mean of 27 mm.  Marland Kitchen ROTATOR CUFF REPAIR     left shoulder  . TRANSTHORACIC ECHOCARDIOGRAM  06/2017     EF 25 and 30%. GR 1 DD. Mild diastolic dysfunction with elevated LVEDP. Mild valvular disease.  . TRANSTHORACIC ECHOCARDIOGRAM  11'18, 6/'19    a) EF remains 30-35%.  Diffuse hypokinesis noted.  Severe LA dilation; b) Improved EF 35-40%.  Diffuse HK.  GR 1 DD.  Moderate TR.  Mild RV dilation.  . TRANSTHORACIC ECHOCARDIOGRAM  1/'20; 1/'21   a) EF 50 to 55%. Gr I DD. (Notabley improved LV Fxn.  Moderate LA dilation.  Mod MAC.  AoV sclerosis w/o stenosis;; b) EF 60 to 65%.  Normal LV size and function.  GRII DD.  Mild to moderate MR.  Normal PA pressures.  Marland Kitchen ZIO Patch 14 d Event Monitor  10/2018   Results as below <1% PVC.  (Notably improved after starting amiodarone)    MEDICATIONS/ALLERGIES   Current Meds  Medication Sig  . amiodarone (PACERONE) 200 MG tablet TAKE 1/2 TABLET BY MOUTH ONCE DAILY  . aspirin 81 MG tablet Take 1 tablet (81 mg total) by mouth daily.  . carvedilol (COREG) 25 MG tablet TAKE 1 TABLET BY MOUTH TWICE DAILY  . Cholecalciferol (VITAMIN D) 1000 UNITS capsule Take 1,000 Units by mouth daily.   . empagliflozin (JARDIANCE) 10 MG TABS tablet Take 5 mg by mouth daily before breakfast.  . glucose blood (ACCU-CHEK AVIVA PLUS) test strip USE TO TEST BLOOD SUGAR ONCE DAILY FOR DM E11.9  . Lancets (ACCU-CHEK MULTICLIX) lancets USE ONE LANCET TO TEST BLOOD GLUCOSE DAILY FOR DM 250.00  . lovastatin (MEVACOR) 40 MG tablet TAKE 1 TABLET BY MOUTH AT BEDTIME  . metFORMIN (GLUCOPHAGE) 500 MG tablet TAKE HALF TABLET BY MOUTH DAILY WITH A MEAL  . sacubitril-valsartan (ENTRESTO) 97-103 MG Take 1 tablet by mouth 2 (two) times daily.  Marland Kitchen spironolactone (ALDACTONE) 25 MG tablet TAKE 1/2 TABLET BY MOUTH ONCE DAILY  . traMADol (ULTRAM) 50 MG tablet TAKE 1 TABLET(50 MG) BY MOUTH EVERY 12 HOURS AS NEEDED    Allergies  Allergen Reactions  . Ezetimibe-Simvastatin Other (See Comments)    REACTION: Muscle aches (side effect)  . Lipitor [Atorvastatin] Other (See Comments)    Leg weakness   .  Tamiflu [Oseltamivir Phosphate] Other (See Comments)    nightmares  . Pravastatin Sodium Other (See Comments)    REACTION: Muscle aches (side effect)  . Sulfonamide Derivatives Nausea And Vomiting    SOCIAL HISTORY/FAMILY HISTORY   Reviewed in Epic:  Pertinent findings: Nothing new  ->  Back to exercising at the gym.  She usually can walk the full lap around roughly 1 mile without stopping.  Occasionally she will to stop halfway through and catch her breath.  She usually is able to walk the whole mile, then after short break will do 30 minutes at the seated elliptical.  OBJCTIVE -PE, EKG, labs   Wt Readings from Last 3 Encounters:  03/05/20 174 lb (78.9 kg)  11/14/19 173 lb 2 oz (78.5 kg)  10/28/19 172 lb (78 kg)    Physical Exam: BP 117/60 Comment: pt's eiectronic digital machine  Pulse 64   Ht _0  (1.575 m)   Wt 174 lb (78.9 kg)   SpO2 94%   BMI 31.83 kg/m  Physical Exam  Constitutional: She is oriented to person, place, and time. She appears well-developed and well-nourished. No distress.  Healthy-appearing.  Well-groomed.  Neck: No hepatojugular reflux and no JVD present. Carotid bruit is not present.  Cardiovascular: Normal rate, regular rhythm, S1 normal, S2 normal and intact distal pulses.  No extrasystoles are present. PMI is not displaced (Difficult to palpate). Exam reveals gallop (Soft S4), S4, distant heart sounds and decreased pulses (Mildly diminished pedal pulses.). Exam reveals no friction rub and no midsystolic click.  No murmur heard. Pulmonary/Chest: Effort normal and breath sounds normal. No respiratory distress. She has no wheezes. She has no rales.  Abdominal: Soft. Bowel sounds are normal. She exhibits no distension. There is no abdominal tenderness. There is no rebound.  Musculoskeletal:        General: No edema (Trivial). Normal range of motion.     Cervical back: Normal range of motion and neck supple.  Neurological: She is alert and oriented to  person, place, and time.  Skin:  Stable chronic thick scaly skin  Psychiatric: She has a normal mood and affect. Her behavior is normal. Judgment normal.  Still somewhat swollen the uptake understanding things, but thinking clearly.  Vitals reviewed.   Adult ECG Report N/a  Recent Labs:    Lab Results  Component Value Date   CHOL 142 11/12/2019   HDL 39.80 11/12/2019   LDLCALC 85 11/12/2019   LDLDIRECT 53 08/10/2009   TRIG 86.0 11/12/2019   CHOLHDL 4 11/12/2019   Lab Results  Component Value Date   CREATININE 1.20 11/12/2019   BUN 14 11/12/2019   NA 142 11/12/2019   K 4.3 11/12/2019   CL 106 11/12/2019   CO2 29 11/12/2019   Lab Results  Component Value Date   TSH 1.87 11/12/2019    ASSESSMENT/PLAN    Problem List Items Addressed This Visit    Dilated cardiomyopathy ([place]) (Chronic)    Nonischemic arrhythmogenic cardiomyopathy related to her frequent PVCs with complete resolution after initiating amiodarone.  By most recent event monitor, almost complete abolition of PVCs.  Plan:   Would continue current medications including amiodarone  We will continue maintenance dose amiodarone at 100 mg daily. ->  Need to ensure that we are doing annual PFTs, LFTs, TFTs and eye exams.  She is on excellent dose of carvedilol and Entresto along with spironolactone.  (Able to reduce to 12.5 mg spironolactone)  On Jardiance -> has not had to be on any diuretic since then.      Frequent PVCs (Chronic)    Still noted, but down from 17% to less than 1% on monitor.  Plan: Continue maintenance dose of amiodarone on milligram daily and carvedilol-for now continue 25 mg twice daily.  Chronic diastolic heart failure (HCC) of systolic heart failure - Primary (Chronic)    No longer having systolic heart failure, now is completely grade 1-2 diastolic dysfunction.  She is on excellent regimen.  No heart failure symptoms.  On stable dose of Jardiance, Entresto and carvedilol, no  diuretic requirement.      Diabetes mellitus without complication (HCC) (Chronic)    Monitored by PCP but is on Metformin and Jardiance.  Excellent regimen for cardiomyopathy.      HLD (hyperlipidemia) (Chronic)    LDL is 85.  Relatively stable.  She is on lovastatin and tolerating it well.  Need to be closely following LFTs while on amiodarone.      Essential hypertension (Chronic)    Excellent blood pressure control on carvedilol, spironolactone and Entresto.          COVID-19 Education: The signs and symptoms of COVID-19 were discussed with the patient and how to seek care for testing (follow up with PCP or arrange E-visit).   The importance of social distancing was discussed today.  I spent a total of 72mnutes with the patient. >  50% of the time was spent in direct patient consultation.  Additional time spent with chart review  / charting (studies, outside notes, etc): 8 Total Time: 32 min   Current medicines are reviewed at length with the patient today.  (+/- concerns) n/a  Notice: This dictation was prepared with Dragon dictation along with smaller phrase technology. Any transcriptional errors that result from this process are unintentional and may not be corrected upon review.  Patient Instructions / Medication Changes & Studies & Tests Ordered   Patient Instructions  Medication Instructions:  No Changes *If you need a refill on your cardiac medications before your next appointment, please call your pharmacy*   Lab Work:  Not needed   Testing/Procedures: Not  needed   Follow-Up: [facility] and your health needs are our priority.  As part of our continuing mission to provide you with exceptional heart care, we have created designated Provider Care Teams.  These Care Teams include your primary Cardiologist (physician) and Advanced Practice Providers (APPs -  Physician Assistants and Nurse Practitioners) who all work together to provide you with  the care you need, when you need it.  We recommend signing up for the patient portal called "MyChart".  Sign up information is provided on this After Visit Summary.  MyChart is used to connect with patients for Virtual Visits (Telemedicine).  Patients are able to view lab/test results, encounter notes, upcoming appointments, etc.  Non-urgent messages can be sent to your provider as well.   To learn more about what you can do with MyChart, go to [email or web address]    Your next appointment:   6 month(s) Sept 2021  The format for your next appointment:   In Person  Provider:   DGlenetta Hew MD        Studies Ordered:   No orders of the defined types were placed in this encounter.    DGlenetta Hew M.D., M.S. Interventional Cardiologist   Pager # [phone] # [phone] [place] [id number]  Thank you for choosing [facility]!

## 2020-03-09 ENCOUNTER — Encounter: Payer: Self-pay | Admitting: Cardiology

## 2020-03-09 NOTE — Assessment & Plan Note (Signed)
Nonischemic arrhythmogenic cardiomyopathy related to her frequent PVCs with complete resolution after initiating amiodarone.  By most recent event monitor, almost complete abolition of PVCs.  Plan:   Would continue current medications including amiodarone  We will continue maintenance dose amiodarone at 100 mg daily. ->  Need to ensure that we are doing annual PFTs, LFTs, TFTs and eye exams.  She is on excellent dose of carvedilol and Entresto along with spironolactone.  (Able to reduce to 12.5 mg spironolactone)  On Jardiance -> has not had to be on any diuretic since then.

## 2020-03-09 NOTE — Assessment & Plan Note (Signed)
LDL is 85.  Relatively stable.  She is on lovastatin and tolerating it well.  Need to be closely following LFTs while on amiodarone.

## 2020-03-09 NOTE — Assessment & Plan Note (Signed)
Excellent blood pressure control on carvedilol, spironolactone and Entresto.

## 2020-03-09 NOTE — Assessment & Plan Note (Signed)
Still noted, but down from 17% to less than 1% on monitor.  Plan: Continue maintenance dose of amiodarone on milligram daily and carvedilol-for now continue 25 mg twice daily.

## 2020-03-09 NOTE — Assessment & Plan Note (Signed)
Monitored by PCP but is on Metformin and Jardiance.  Excellent regimen for cardiomyopathy.

## 2020-03-09 NOTE — Assessment & Plan Note (Signed)
No longer having systolic heart failure, now is completely grade 1-2 diastolic dysfunction.  She is on excellent regimen.  No heart failure symptoms.  On stable dose of Jardiance, Entresto and carvedilol, no diuretic requirement.

## 2020-04-16 ENCOUNTER — Other Ambulatory Visit: Payer: Self-pay | Admitting: Family Medicine

## 2020-04-17 ENCOUNTER — Ambulatory Visit (HOSPITAL_COMMUNITY)
Admission: RE | Admit: 2020-04-17 | Discharge: 2020-04-17 | Disposition: A | Discharge status: Home / Self Care | Payer: Medicare Other | Source: Ambulatory Visit | Attending: Internal Medicine | Admitting: Internal Medicine

## 2020-04-17 ENCOUNTER — Encounter (HOSPITAL_COMMUNITY): Payer: Self-pay | Admitting: Internal Medicine

## 2020-04-17 ENCOUNTER — Other Ambulatory Visit: Payer: Self-pay

## 2020-04-17 VITALS — BP 138/70 | HR 65 | Wt 173.6 lb

## 2020-04-17 DIAGNOSIS — Z87891 Personal history of nicotine dependence: Secondary | ICD-10-CM | POA: Diagnosis not present

## 2020-04-17 DIAGNOSIS — I493 Ventricular premature depolarization: Secondary | ICD-10-CM | POA: Diagnosis not present

## 2020-04-17 DIAGNOSIS — Z8249 Family history of ischemic heart disease and other diseases of the circulatory system: Secondary | ICD-10-CM | POA: Insufficient documentation

## 2020-04-17 DIAGNOSIS — Z833 Family history of diabetes mellitus: Secondary | ICD-10-CM | POA: Diagnosis not present

## 2020-04-17 DIAGNOSIS — Z7982 Long term (current) use of aspirin: Secondary | ICD-10-CM | POA: Diagnosis not present

## 2020-04-17 DIAGNOSIS — Z888 Allergy status to other drugs, medicaments and biological substances status: Secondary | ICD-10-CM | POA: Insufficient documentation

## 2020-04-17 DIAGNOSIS — I5042 Chronic combined systolic (congestive) and diastolic (congestive) heart failure: Secondary | ICD-10-CM | POA: Insufficient documentation

## 2020-04-17 DIAGNOSIS — Z7984 Long term (current) use of oral hypoglycemic drugs: Secondary | ICD-10-CM | POA: Insufficient documentation

## 2020-04-17 DIAGNOSIS — I110 Hypertensive heart disease with heart failure: Secondary | ICD-10-CM | POA: Diagnosis not present

## 2020-04-17 DIAGNOSIS — E119 Type 2 diabetes mellitus without complications: Secondary | ICD-10-CM | POA: Diagnosis not present

## 2020-04-17 DIAGNOSIS — I428 Other cardiomyopathies: Secondary | ICD-10-CM | POA: Insufficient documentation

## 2020-04-17 DIAGNOSIS — I5022 Chronic systolic (congestive) heart failure: Secondary | ICD-10-CM

## 2020-04-17 DIAGNOSIS — G4733 Obstructive sleep apnea (adult) (pediatric): Secondary | ICD-10-CM | POA: Diagnosis not present

## 2020-04-17 DIAGNOSIS — Z79899 Other long term (current) drug therapy: Secondary | ICD-10-CM | POA: Diagnosis not present

## 2020-04-17 DIAGNOSIS — Z882 Allergy status to sulfonamides status: Secondary | ICD-10-CM | POA: Insufficient documentation

## 2020-04-17 DIAGNOSIS — I10 Essential (primary) hypertension: Secondary | ICD-10-CM

## 2020-04-17 DIAGNOSIS — I2510 Atherosclerotic heart disease of native coronary artery without angina pectoris: Secondary | ICD-10-CM | POA: Diagnosis not present

## 2020-04-17 DIAGNOSIS — K219 Gastro-esophageal reflux disease without esophagitis: Secondary | ICD-10-CM | POA: Diagnosis not present

## 2020-04-17 DIAGNOSIS — E785 Hyperlipidemia, unspecified: Secondary | ICD-10-CM | POA: Insufficient documentation

## 2020-04-17 DIAGNOSIS — Z801 Family history of malignant neoplasm of trachea, bronchus and lung: Secondary | ICD-10-CM | POA: Insufficient documentation

## 2020-04-17 LAB — COMPREHENSIVE METABOLIC PANEL
ALT: 7 U/L (ref 0–44)
AST: 17 U/L (ref 15–41)
Albumin: 3.7 g/dL (ref 3.5–5.0)
Alkaline Phosphatase: 55 U/L (ref 38–126)
Anion gap: 7 (ref 5–15)
BUN: 18 mg/dL (ref 8–23)
CO2: 28 mmol/L (ref 22–32)
Calcium: 9.5 mg/dL (ref 8.9–10.3)
Chloride: 106 mmol/L (ref 98–111)
Creatinine, Ser: 1.48 mg/dL — ABNORMAL HIGH (ref 0.44–1.00)
GFR calc Af Amer: 39 mL/min — ABNORMAL LOW (ref >60)
GFR calc non Af Amer: 33 mL/min — ABNORMAL LOW (ref >60)
Glucose, Bld: 94 mg/dL (ref 70–99)
Potassium: 5.9 mmol/L — ABNORMAL HIGH (ref 3.5–5.1)
Sodium: 141 mmol/L (ref 135–145)
Total Bilirubin: 1 mg/dL (ref 0.3–1.2)
Total Protein: 7 g/dL (ref 6.5–8.1)

## 2020-04-17 LAB — T4, FREE: Free T4: 1.32 ng/dL — ABNORMAL HIGH (ref 0.61–1.12)

## 2020-04-17 LAB — TSH: TSH: 2.148 u[IU]/mL (ref 0.350–4.500)

## 2020-04-17 NOTE — Patient Instructions (Signed)
Labs done today, your results will be available in MyChart, we will contact you for abnormal readings.  Please call our office in February 2022 to schedule your next follow up appointment  If you have any questions or concerns before your next appointment please send [place] a message through [place] or call our office at [phone].  At [facility], you and your health needs are our priority. As part of our continuing mission to provide you with exceptional heart care, we have created designated Provider Care Teams. These Care Teams include your primary Cardiologist (physician) and Advanced Practice Providers (APPs- Physician Assistants and Nurse Practitioners) who all work together to provide you with the care you need, when you need it.   You may see any of the following providers on your designated Care Team at your next follow up: Marland Kitchen Dr Arvilla Meres . Dr Marca Ancona . Tonye Becket, NP . Robbie Lis, PA . Karle Plumber, PharmD   Please be sure to bring in all your medications bottles to every appointment.

## 2020-04-17 NOTE — Addendum Note (Signed)
Encounter addended by: Noralee Space, RN on: 04/17/2020 3:09 PM  Actions taken: Order list changed, Diagnosis association updated, Clinical Note Signed, Charge Capture section accepted

## 2020-04-17 NOTE — Progress Notes (Signed)
ADVANCED HF CLINIC NOTE  Referring Physician: Ellyn Hack  Primary Care: Damita Dunnings Primary Cardiologist: Ellyn Hack  HPI:  Natasha Woodard is a 79 y.o. female with a h/o HL, HTN, DM2 and systolic HF due to NICM referred by Dr. Ellyn Hack for further evaluation of her HF.   [name] was initially evaluated in the summer 2018 and was noted to have frequent PVCs on EKG with a low resting heart rate. Holter showed 13% PVCs. Echo showed an EF of 20-30% and a Holter monitor which revealed significant amount of ventricular ectopy. She was also referred for right and left heart catheterization back in July that showed minimal CAD with EF 25-30%, LVEDP 26 mmHg with PCWP of only 16 mHg).  She has been treated medically. F/u echo 10/26/2017  2 D Echo 10/26/2017: EF remains 30-35%.  Diffuse hypokinesis noted.  Severe LA dilation. RV normal  She returns today for regular follow up. In 11/19 was having more PVCs on ECG so we placed ZioPatch. Results as below < 1% PVC. Echo 1/20 EF 50-55% Says she feels really well. Goes to [employer] 3x/week. Does 3 miles on [place] (30 minutes) and walks 1 mile on the track (30 mins). Breathing much better. Sleeping much better. No orthopnea or PND.   Echo 1/20 EF 55% Personally reviewed  Echo 6/19 EF 35-40%  CMRI: 01/17/18 IMPRESSION: 1.  Difficult study due to frequent PVCs. 2.  Normal LV size with EF 27%, diffuse hypokinesis 3.  Mildly dilated RV with normal systolic function 4. No myocardial LGE, so no definitive evidence for prior MI, infiltrative disease, or myocarditis. Suspect nonischemic cardiomyopathy, possibly due to PVCs.  Event monitor 01/20/18 Normal sinus rhythm with frequent multifocal PVCs (9%) and PACs. Nighttime bradycardia suggestive of OSA.   ZioPatch 11/19  1. Predominant underlying rhythm was Sinus Rhythm. Min HR of 46 bpm, max HR of 143 bpm, and avg HR of 62 bpm. 2. Two brief runs NSVT the longest lasting 4 beats with an avg rate of 110 bpm. 3.  Occasional PVCs (< 1.0%)    Past Medical History:  Diagnosis Date  . Calcific tendonitis 2008   Treatment of left leg  . Cardiomyopathy ([place]) 06/2017   a) Echo: EF 25-30%. GR 1-2 DD w/ elevated LVEDP. Mild valvular Dz; b) Cardiac MRI 2/'19: frequent PVCs (diffiuclt to interpret) - EF ~27% w/ diffuse HK.  No evidence of infarct, infiltrative Dz or myocarditis. -- ? if related to PVCs. c) f/u Echo 6/'19: EF 35-40%. Gr 1 DD. Diffuse HK.-> d) Jan 2020 EF 50 to 55%.  Mod MAC, mod LAE.  Ao Sclerosis; e) Echo 1/'21 - EF 60-65%, Gr II DD. Mild Mod MR.   . Chronic combined systolic and diastolic CHF, NYHA class 2 and ACA/AHA stage C    Now essentially resolved, back to simply diastolic CHF  . Coronary artery disease, non-occlusive    mild-moderate CAD 06/30/17 cath  . CTS (carpal tunnel syndrome)   . Diabetes mellitus    Type II  . Dysrhythmia    patient said that she cant remember what it is  . 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  Nighttime bradycardia suggestive of OSA.  Marland Kitchen Hiatal hernia    with reflux  . Hyperlipidemia    Statin intolerant  . Hypertension   . Ichthyosis congenita   . NSVD (normal spontaneous vaginal delivery) 1971 & 1972  . Obesity   .  Plantar fasciitis    Left  . Pulmonary nodule    Imaged multiple times and benign appearing  . Wears glasses     Current Outpatient Medications  Medication Sig Dispense Refill  . amiodarone (PACERONE) 200 MG tablet TAKE 1/2 TABLET BY MOUTH ONCE DAILY 45 tablet 1  . aspirin 81 MG tablet Take 1 tablet (81 mg total) by mouth daily.    . carvedilol (COREG) 25 MG tablet TAKE 1 TABLET BY MOUTH TWICE DAILY 180 tablet 1  . Cholecalciferol (VITAMIN D) 1000 UNITS capsule Take 1,000 Units by mouth daily.     . empagliflozin (JARDIANCE) 10 MG TABS tablet Take 5 mg by mouth daily before breakfast. 15 tablet 11  . glucose blood (ACCU-CHEK AVIVA PLUS) test strip USE  TO TEST BLOOD SUGAR ONCE DAILY FOR DM E11.9 100 each 1  . Lancets (ACCU-CHEK MULTICLIX) lancets USE ONE LANCET TO TEST BLOOD GLUCOSE DAILY FOR DM 250.00 102 each 3  . lovastatin (MEVACOR) 40 MG tablet TAKE 1 TABLET BY MOUTH AT BEDTIME 90 tablet 1  . metFORMIN (GLUCOPHAGE) 500 MG tablet Take 250 mg by mouth daily.    . sacubitril-valsartan (ENTRESTO) 97-103 MG Take 1 tablet by mouth 2 (two) times daily. 180 tablet 1  . spironolactone (ALDACTONE) 25 MG tablet TAKE 1/2 TABLET BY MOUTH ONCE DAILY 45 tablet 1  . traMADol (ULTRAM) 50 MG tablet TAKE 1 TABLET(50 MG) BY MOUTH EVERY 12 HOURS AS NEEDED 180 tablet 1   No current facility-administered medications for this encounter.    Allergies  Allergen Reactions  . Ezetimibe-Simvastatin Other (See Comments)    REACTION: Muscle aches (side effect)  . Lipitor [Atorvastatin] Other (See Comments)    Leg weakness   . Tamiflu [Oseltamivir Phosphate] Other (See Comments)    nightmares  . Pravastatin Sodium Other (See Comments)    REACTION: Muscle aches (side effect)  . Sulfonamide Derivatives Nausea And Vomiting      Social History   Socioeconomic History  . Marital status: Married    Spouse name: Not on file  . Number of children: 2  . Years of education: Not on file  . Highest education level: Not on file  Occupational History    Employer: RETIRED  Tobacco Use  . Smoking status: Former Smoker    Years: 28.00    Types: Cigarettes    Quit date: 12/12/1993    Years since quitting: 26.3  . Smokeless tobacco: Never Used  Substance and Sexual Activity  . Alcohol use: No    Alcohol/week: 0.0 standard drinks  . Drug use: No  . Sexual activity: Never  Other Topics Concern  . Not on file  Social History Narrative   Two kids, two grandchildren, two great grandchildren.    Married 1969   Retired.    Social Determinants of Health   Financial Resource Strain: Low Risk   . Difficulty of Paying Living Expenses: Not hard at all  Food  Insecurity: No Food Insecurity  . Worried About Charity fundraiser in the Last Year: Never true  . Ran Out of Food in the Last Year: Never true  Transportation Needs: No Transportation Needs  . Lack of Transportation (Medical): No  . Lack of Transportation (Non-Medical): No  Physical Activity: Sufficiently Active  . Days of Exercise per Week: 3 days  . Minutes of Exercise per Session: 60 min  Stress: No Stress Concern Present  . Feeling of Stress : Not at all  Social Connections:   .  Frequency of Communication with Friends and Family:   . Frequency of Social Gatherings with Friends and Family:   . Attends Religious Services:   . Active Member of Clubs or Organizations:   . Attends Archivist Meetings:   Marland Kitchen Marital Status:   Intimate Partner Violence: Not At Risk  . Fear of Current or Ex-Partner: No  . Emotionally Abused: No  . Physically Abused: No  . Sexually Abused: No      Family History  Problem Relation Age of Onset  . Heart disease Mother        s/p pacemaker  . Diabetes Mother   . Heart disease Father   . Diabetes Father   . Heart disease Brother        MI  . Cancer Brother        Lung  . Hypertension Other   . Colon cancer Neg Hx   . Breast cancer Neg Hx     Vitals:   04/17/20 1417  BP: 138/70  Pulse: 65  SpO2: 95%  Weight: 78.7 kg (173 lb 9.6 oz)   Filed Weights   04/17/20 1417  Weight: 78.7 kg (173 lb 9.6 oz)    PHYSICAL EXAM: General:  Well appearing. No resp difficulty HEENT: normal Neck: supple. no JVD. Carotids 2+ bilat; no bruits. No lymphadenopathy or thryomegaly appreciated. Cor: PMI nondisplaced. Regular rate & rhythm. No rubs, gallops or murmurs. Lungs: clear Abdomen: obese. soft, nontender, nondistended. No hepatosplenomegaly. No bruits or masses. Good bowel sounds. Extremities: no cyanosis, clubbing, rash, edema Neuro: alert & orientedx3, cranial nerves grossly intact. moves all 4 extremities w/o difficulty. Affect  pleasant   ASSESSMENT & PLAN:  1. Chronic systolic HF - due to NICM. Cath 7/18 with minimal CAD. Initial EF 20-25%. Etiology ? Frequent PVCs - Echo 11/18 EF 30-35%. - cMRI EF 27% No LGE. 2/19 - Echo 6/19 EF ~35-40%  - Echo 1/20  EF 50-55% - complete recovery of LV function with PVC suppression  - Doing great NYHA I-II - Volume status looks good.  - Holter monitor 2/19 9% PVCs  (Previous holter monitor 7/18 with 13% PVCs) - ZioPatch 11/19 <1% PVC - Continue entresto 97/103 - Continue carvedilol 18.75 bid - Continue spiro 12.5 daily. Potassium runs about 5.0 will not titrate.  - Continue Jardiance - F/u 6 months with ECHO  2. Frequent PVCs - Initial monitor with 13% PVCs 7/18. ? Etiology of NICM - F/u holter monitor 2/19 9% PVCs  -- ZioPatch 11/19 <1% PVC - Continue amio 100 daily. Will check amio labs - Had sleep study in November with Dr. Annamaria Boots. Very mild OSA (5.9/hr) told she doesn't need CPAP - Suggested trying to lose 15-20 pounds  3. HTN - Blood pressure well controlled. Continue current regimen.   4. DM2 - Continue metformin and Jardiance  Glori Bickers, MD  3:03 PM

## 2020-04-18 LAB — T3, FREE: T3, Free: 2.3 pg/mL (ref 2.0–4.4)

## 2020-04-20 ENCOUNTER — Telehealth: Payer: Self-pay | Admitting: *Deleted

## 2020-04-20 ENCOUNTER — Encounter (HOSPITAL_COMMUNITY): Payer: Self-pay

## 2020-04-20 DIAGNOSIS — E875 Hyperkalemia: Secondary | ICD-10-CM

## 2020-04-20 DIAGNOSIS — I10 Essential (primary) hypertension: Secondary | ICD-10-CM

## 2020-04-20 NOTE — Telephone Encounter (Signed)
-----   Message from Marykay Lex, MD sent at 04/20/2020 12:46 AM EDT ----- Potassium level is running low but high.  I would like to have you stop taking spironolactone.  If you start noticing a little more swelling, we may add back a little bit of the furosemide.  Would like to recheck chemistry level by Thursday of this week.  Bryan Lemma, MD

## 2020-04-20 NOTE — Telephone Encounter (Signed)
Reviewed recommendations with patient, she will get labs rechecked Thursday

## 2020-04-23 ENCOUNTER — Telehealth (HOSPITAL_COMMUNITY): Payer: Self-pay | Admitting: *Deleted

## 2020-04-23 ENCOUNTER — Other Ambulatory Visit: Payer: Self-pay | Admitting: Family Medicine

## 2020-04-23 ENCOUNTER — Telehealth: Payer: Self-pay | Admitting: Cardiology

## 2020-04-23 DIAGNOSIS — I5042 Chronic combined systolic (congestive) and diastolic (congestive) heart failure: Secondary | ICD-10-CM

## 2020-04-23 DIAGNOSIS — E875 Hyperkalemia: Secondary | ICD-10-CM | POA: Diagnosis not present

## 2020-04-23 DIAGNOSIS — I10 Essential (primary) hypertension: Secondary | ICD-10-CM | POA: Diagnosis not present

## 2020-04-23 LAB — BASIC METABOLIC PANEL
BUN/Creatinine Ratio: 12 (ref 12–28)
BUN: 17 mg/dL (ref 8–27)
CO2: 25 mmol/L (ref 20–29)
Calcium: 9.8 mg/dL (ref 8.7–10.3)
Chloride: 107 mmol/L — ABNORMAL HIGH (ref 96–106)
Creatinine, Ser: 1.39 mg/dL — ABNORMAL HIGH (ref 0.57–1.00)
GFR calc Af Amer: 42 mL/min/{1.73_m2} — ABNORMAL LOW (ref >59)
GFR calc non Af Amer: 36 mL/min/{1.73_m2} — ABNORMAL LOW (ref >59)
Glucose: 145 mg/dL — ABNORMAL HIGH (ref 65–99)
Potassium: 6.1 mmol/L (ref 3.5–5.2)
Sodium: 145 mmol/L — ABNORMAL HIGH (ref 134–144)

## 2020-04-23 NOTE — Telephone Encounter (Addendum)
Called by [facility] about critical lab: potassium 6.1.  Noted recent value of 5.9.  Attempted to call patient at [phone] but no answer.  Will alert team in AM and have her recheck her labs first thing.   Antanasia Kaczynski K. Charna Busman, MD

## 2020-04-23 NOTE — Telephone Encounter (Signed)
Pt called very upset and in a panic about lab work, especially GFR. Spent 15 min on the phone with her discussed the results and how they relate to her conditions and medications. She is very thankful for the call and info, she feels much about it now, she has stopped with Cleda Daub per Dr Herbie Baltimore and will get her repeat labs done

## 2020-04-24 ENCOUNTER — Telehealth: Payer: Self-pay | Admitting: *Deleted

## 2020-04-24 ENCOUNTER — Other Ambulatory Visit: Payer: Self-pay | Admitting: *Deleted

## 2020-04-24 ENCOUNTER — Encounter (HOSPITAL_COMMUNITY): Payer: Self-pay

## 2020-04-24 ENCOUNTER — Other Ambulatory Visit (HOSPITAL_COMMUNITY): Payer: Medicare Other

## 2020-04-24 DIAGNOSIS — E875 Hyperkalemia: Secondary | ICD-10-CM

## 2020-04-24 DIAGNOSIS — I5022 Chronic systolic (congestive) heart failure: Secondary | ICD-10-CM | POA: Diagnosis not present

## 2020-04-24 NOTE — Addendum Note (Signed)
Addended by: Theresia Bough on: 04/24/2020 03:34 PM   Modules accepted: Orders

## 2020-04-24 NOTE — Telephone Encounter (Signed)
Late entry , patient to office today  Have labs drawn. Patient states she is very confused ( some what upset about  Labs being drawn  about who is ordering labs and  Why they are ordering. RN spent  @ 30 - 40 mins  Explaining to patient.  Labs and why she is here at this office and not heart failure clinic. Patient wanted to know  How  Both office got involve in her having labowork  Rn explain to her  Potassium was elevated  That is why she need labs today  Even though she had labs yesterday   Patient did allow the  [facility]  Represent draw labwork _BMP

## 2020-04-24 NOTE — Telephone Encounter (Signed)
Patient will return for repeat labs 5/14

## 2020-04-25 ENCOUNTER — Other Ambulatory Visit: Payer: Self-pay | Admitting: Family Medicine

## 2020-04-25 LAB — BASIC METABOLIC PANEL
BUN/Creatinine Ratio: 18 (ref 12–28)
BUN: 25 mg/dL (ref 8–27)
CO2: 24 mmol/L (ref 20–29)
Calcium: 10 mg/dL (ref 8.7–10.3)
Chloride: 105 mmol/L (ref 96–106)
Creatinine, Ser: 1.39 mg/dL — ABNORMAL HIGH (ref 0.57–1.00)
GFR calc Af Amer: 42 mL/min/{1.73_m2} — ABNORMAL LOW (ref >59)
GFR calc non Af Amer: 36 mL/min/{1.73_m2} — ABNORMAL LOW (ref >59)
Glucose: 79 mg/dL (ref 65–99)
Potassium: 5.1 mmol/L (ref 3.5–5.2)
Sodium: 143 mmol/L (ref 134–144)

## 2020-04-27 ENCOUNTER — Telehealth (HOSPITAL_COMMUNITY): Payer: Self-pay | Admitting: *Deleted

## 2020-04-27 NOTE — Telephone Encounter (Signed)
Pt left VM stating she needed to schedule a telephone call with Dr.Bensimhon. I called pt back she was very frustrated and concerned about why our office called her to have labs drawn on 5/14 if she had labs drawn on 5/13. I explained to her that her potassium was high and we needed to recheck it to make sure it was an accurate reading. Her potassium was 5.1 on 5/14 she asked how that number changed so drastically and I explained to her that sometimes the blood can be hemolyzed and give a false reading and that's why we always recheck critical labs. Pt continued to question why our office and Dr.Hardings office were calling her about lab work. She was up set that Chantel did not say she was calling from Dr.Bensimhons office but she said she was calling from the heart failure clinic. I explained to patient we have four providers here in our office so we normally state that we are calling from the heart failure clinic. I apologized for any confusion. I spent about 23-43mins on the phone with patient. She thanked me for all of my help and patience and stated she felt she had a better understanding now and did not need a call from the doctor. I told the pt to call me back if she had any other questions or concerns.

## 2020-04-30 ENCOUNTER — Telehealth: Payer: Self-pay

## 2020-04-30 NOTE — Telephone Encounter (Signed)
In her case, I do not think she has to restrict high potassium foods.  The main issue is to eat a heart healthy diet that is also low in carbohydrates, i.e. a healthy diabetic diet.  In terms of her potassium, it is likely more important to adjust her medications (i.e. potentially holding spironolactone-per cardiology), instead of avoiding healthy foods that happen to have some extra potassium.  Please let me know if this does not make sense to the patient.

## 2020-04-30 NOTE — Telephone Encounter (Signed)
Pt called to see if there were any questions pt could not ask doctor on my chart; I advised pt should not send my chart messages for urgent or emergent issues. Also making appts is better to call office. Pt voiced understanding. Pt said 04/23/20 K 6.1 and on 04/24/20 K was 5.1. pt is not taking K medication and spironolactone was stopped by Dr Herbie Baltimore. Pt said she is in stg 3 kidney failure and wants Dr Lianne Bushy opinion of how much K in foods and drinks should pt have and are there any foods or drinks pt should avoid or drink or eat more of. Pt is aware that heart failure team is handing her K and labs but pt has not asked this of the heart failure doctors. Pt said cb tomorrow was OK, no rush.Please advise.

## 2020-05-01 ENCOUNTER — Ambulatory Visit: Payer: Medicare Other | Admitting: Family Medicine

## 2020-05-01 NOTE — Telephone Encounter (Signed)
Left detailed message on voicemail. DPR 

## 2020-05-09 ENCOUNTER — Encounter: Payer: Self-pay | Admitting: Family Medicine

## 2020-05-15 ENCOUNTER — Telehealth: Payer: Self-pay | Admitting: Family Medicine

## 2020-05-15 NOTE — Telephone Encounter (Signed)
Last filled 04-11-20 #60 Last OV 11-14-19 No Future OV [facility]

## 2020-05-17 ENCOUNTER — Other Ambulatory Visit: Payer: Self-pay | Admitting: Family Medicine

## 2020-05-17 NOTE — Telephone Encounter (Signed)
Sent. Needs OV re: DM2 f/u, can do labs at the visit.  Doesn't need to fast.  Thanks.

## 2020-05-18 NOTE — Telephone Encounter (Signed)
Electronic refill request. Furosemide Last office visit:   11/14/2019 Last Filled:   The original prescription was discontinued on 07/29/2019 by Branch, Philicia R, CMA for the following reason: Discontinued by provider. Renewing this prescription may not be appropriate. Please advise.

## 2020-05-19 NOTE — Telephone Encounter (Signed)
Please verify with patient.  I think this was an autorefill request and the patient is off the medicine.  Thanks.

## 2020-05-19 NOTE — Telephone Encounter (Signed)
Patient states she had been taken off of the medication but Dr. Herbie Baltimore told her last week to take it on M,W,F and an extra 20 mg if weight gain is more than 3 pounds.  Patient does need the refill because she has none right now.

## 2020-05-20 NOTE — Telephone Encounter (Signed)
Sent. Thanks.   

## 2020-06-25 NOTE — Telephone Encounter (Signed)
Got patient scheduled for DM f/u for next Thursday!

## 2020-06-26 NOTE — Telephone Encounter (Signed)
Thanks

## 2020-07-02 ENCOUNTER — Other Ambulatory Visit: Payer: Self-pay

## 2020-07-02 ENCOUNTER — Ambulatory Visit (INDEPENDENT_AMBULATORY_CARE_PROVIDER_SITE_OTHER): Payer: Medicare Other | Admitting: Family Medicine

## 2020-07-02 ENCOUNTER — Encounter: Payer: Self-pay | Admitting: Family Medicine

## 2020-07-02 VITALS — BP 132/86 | HR 63 | Temp 96.2°F | Ht 62.0 in | Wt 174.0 lb

## 2020-07-02 DIAGNOSIS — E119 Type 2 diabetes mellitus without complications: Secondary | ICD-10-CM

## 2020-07-02 LAB — POCT GLYCOSYLATED HEMOGLOBIN (HGB A1C): Hemoglobin A1C: 6.1 % — AB (ref 4.0–5.6)

## 2020-07-02 MED ORDER — METFORMIN HCL 500 MG PO TABS: 250.0000 mg | ORAL_TABLET | Freq: Two times a day (BID) | ORAL | Status: DC

## 2020-07-02 NOTE — Progress Notes (Signed)
This visit occurred during the SARS-CoV-2 public health emergency.  Safety protocols were in place, including screening questions prior to the visit, additional usage of staff PPE, and extensive cleaning of exam room while observing appropriate contact time as indicated for disinfecting solutions.  Diabetes:  Using medications without difficulties: yes Hypoglycemic episodes:no Hyperglycemic episodes:no Feet problems:no Blood Sugars averaging: ~100 eye exam within last year: yes A1c d/w pt.    covid guidelines d/w pt in detail.  Discussed vaccination, current risk, etc.  Meds, vitals, and allergies reviewed.   ROS: Per HPI unless specifically indicated in ROS section   GEN: nad, alert and oriented HEENT: ncat NECK: supple w/o LA CV: rrr. PULM: ctab, no inc wob ABD: soft, +bs EXT: no edema SKIN: chronic changes at baseline.

## 2020-07-02 NOTE — Patient Instructions (Signed)
Don't change your meds for now and recheck at a yearly visit in about 4 months.  Take care.  Glad to see you. Update me as needed.

## 2020-07-03 ENCOUNTER — Telehealth: Payer: Self-pay

## 2020-07-03 NOTE — Telephone Encounter (Signed)
Pt called triage line asking why Dr Para March had someone call her last week to have her blood sugar checked prior to her appt with him yesterday. She meant to ask him that at her visit. Asking was something wrong that made him have someone call ahead. I advised her it would be next week before someone could call her back.

## 2020-07-05 NOTE — Assessment & Plan Note (Signed)
A1c reasonable.  No change in medications at this point.  Continue work on diet and exercise.  Continue Metformin.  Continue statin.  Recheck periodically.  See after visit summary.  Detailed discussion with patient about Covid vaccination, exposure risk, pathophysiology, variant emergent, etc.  Continue with routine guidance.  She is vaccinated.  She will update me as needed.  At least 30 minutes were devoted to patient care in this encounter (this can potentially include time spent reviewing the patient's file/history, interviewing and examining the patient, counseling/reviewing plan with patient, ordering referrals, ordering tests, reviewing relevant laboratory or x-ray data, and documenting the encounter).

## 2020-07-06 NOTE — Telephone Encounter (Signed)
It was only to get extra data prior to the visit.  Nothing wrong.  This is routine.  Thanks.

## 2020-07-06 NOTE — Telephone Encounter (Signed)
Patient advised.

## 2020-07-22 ENCOUNTER — Other Ambulatory Visit: Payer: Self-pay | Admitting: Family Medicine

## 2020-07-23 NOTE — Telephone Encounter (Signed)
Electronic refill request.   Natasha Woodard   Not sure if I can refill this medication: Last office visit:   07/02/2020 Last Filled:    180 tablet 1 04/10/2019  Please advise.

## 2020-07-24 NOTE — Telephone Encounter (Signed)
Sent. Thanks.   

## 2020-07-28 ENCOUNTER — Telehealth: Payer: Self-pay | Admitting: *Deleted

## 2020-07-28 NOTE — Telephone Encounter (Signed)
Patient left a voicemail stating that she would like Dr. Para March to let her know if she should get the third covid vaccine now.Patietnt wants to know if her health qualifies her for the vaccine?

## 2020-07-29 NOTE — Telephone Encounter (Signed)
Please let her know that we are awaiting updated guidelines and details about the booster shots.  This is likely going to happen but we do not have final guidelines yet.  Thanks.

## 2020-07-30 ENCOUNTER — Other Ambulatory Visit: Payer: Self-pay | Admitting: Internal Medicine

## 2020-07-30 NOTE — Telephone Encounter (Signed)
Left detailed message on voicemail.  

## 2020-08-03 ENCOUNTER — Telehealth: Payer: Self-pay | Admitting: *Deleted

## 2020-08-03 MED ORDER — FLUCONAZOLE 150 MG PO TABS: 150.0000 mg | ORAL_TABLET | Freq: Once | ORAL | 0 refills | Status: AC

## 2020-08-03 NOTE — Telephone Encounter (Signed)
Patient called requesting something for a yeast infection. Patient stated that she had the same thing about a year ago and Dr. Para March gave her a pill to take that cleared it right up. Patient stated that she has had some vaginal itching, but no discharge. Patient stated that she has not recently taken an antibiotic. Patient stated that she has tried OTC yeast medication that has not helped. Patient is requesting that Dr. Para March sendt her a prescription in for the yeast infection. Pharmacy- [address]

## 2020-08-03 NOTE — Telephone Encounter (Signed)
rx sent for fluconazole.  Hold amiodarone and lovastatin on the day she takes fluconazole.  Update [place] as needed.  Thanks.

## 2020-08-03 NOTE — Telephone Encounter (Signed)
Patient advised.

## 2020-08-05 ENCOUNTER — Telehealth: Payer: Self-pay | Admitting: *Deleted

## 2020-08-05 NOTE — Telephone Encounter (Signed)
Patient notified as instructed by telephone and verbalized understanding. 

## 2020-08-05 NOTE — Telephone Encounter (Signed)
Should be okay to do.  Thanks.  °

## 2020-08-05 NOTE — Telephone Encounter (Signed)
Patient left a voicemail wanting to know if it is okay toe add Whey Protein Powder to her diet. Patient stated that she is asking this since she is on so much medication.

## 2020-08-20 ENCOUNTER — Other Ambulatory Visit (HOSPITAL_COMMUNITY): Payer: Self-pay

## 2020-08-20 MED ORDER — ENTRESTO 97-103 MG PO TABS: 1.0000 | ORAL_TABLET | Freq: Two times a day (BID) | ORAL | 1 refills | Status: DC

## 2020-08-21 ENCOUNTER — Telehealth: Payer: Self-pay | Admitting: *Deleted

## 2020-08-21 ENCOUNTER — Telehealth (INDEPENDENT_AMBULATORY_CARE_PROVIDER_SITE_OTHER): Payer: Medicare Other | Admitting: Cardiology

## 2020-08-21 ENCOUNTER — Encounter: Payer: Self-pay | Admitting: Cardiology

## 2020-08-21 VITALS — BP 122/80 | Ht 62.0 in | Wt 171.4 lb

## 2020-08-21 DIAGNOSIS — I493 Ventricular premature depolarization: Secondary | ICD-10-CM | POA: Diagnosis not present

## 2020-08-21 DIAGNOSIS — E875 Hyperkalemia: Secondary | ICD-10-CM

## 2020-08-21 DIAGNOSIS — Z79899 Other long term (current) drug therapy: Secondary | ICD-10-CM

## 2020-08-21 DIAGNOSIS — I5032 Chronic diastolic (congestive) heart failure: Secondary | ICD-10-CM | POA: Diagnosis not present

## 2020-08-21 DIAGNOSIS — I10 Essential (primary) hypertension: Secondary | ICD-10-CM | POA: Diagnosis not present

## 2020-08-21 DIAGNOSIS — I420 Dilated cardiomyopathy: Secondary | ICD-10-CM | POA: Diagnosis not present

## 2020-08-21 DIAGNOSIS — E782 Mixed hyperlipidemia: Secondary | ICD-10-CM

## 2020-08-21 NOTE — Telephone Encounter (Signed)
  Patient Consent for Virtual Visit         Natasha Woodard has provided verbal consent on 08/21/2020 for a virtual visit (video or telephone).   CONSENT FOR VIRTUAL VISIT FOR:  Natasha Woodard  By participating in this virtual visit I agree to the following:  I hereby voluntarily request, consent and authorize [facility] and its employed or contracted physicians, physician assistants, nurse practitioners or other licensed health care professionals (the Practitioner), to provide me with telemedicine health care services (the "Services") as deemed necessary by the treating Practitioner. I acknowledge and consent to receive the Services by the Practitioner via telemedicine. I understand that the telemedicine visit will involve communicating with the Practitioner through live audiovisual communication technology and the disclosure of certain medical information by electronic transmission. I acknowledge that I have been given the opportunity to request an in-person assessment or other available alternative prior to the telemedicine visit and am voluntarily participating in the telemedicine visit.  I understand that I have the right to withhold or withdraw my consent to the use of telemedicine in the course of my care at any time, without affecting my right to future care or treatment, and that the Practitioner or I may terminate the telemedicine visit at any time. I understand that I have the right to inspect all information obtained and/or recorded in the course of the telemedicine visit and may receive copies of available information for a reasonable fee.  I understand that some of the potential risks of receiving the Services via telemedicine include:  Marland Kitchen Delay or interruption in medical evaluation due to technological equipment failure or disruption; . Information transmitted may not be sufficient (e.g. poor resolution of images) to allow for appropriate medical decision making by the  Practitioner; and/or  . In rare instances, security protocols could fail, causing a breach of personal health information.  Furthermore, I acknowledge that it is my responsibility to provide information about my medical history, conditions and care that is complete and accurate to the best of my ability. I acknowledge that Practitioner's advice, recommendations, and/or decision may be based on factors not within their control, such as incomplete or inaccurate data provided by me or distortions of diagnostic images or specimens that may result from electronic transmissions. I understand that the practice of medicine is not an exact science and that Practitioner makes no warranties or guarantees regarding treatment outcomes. I acknowledge that a copy of this consent can be made available to me via my patient portal [facility] MyChart), or I can request a printed copy by calling the office of [facility].    I understand that my insurance will be billed for this visit.   I have read or had this consent read to me. . I understand the contents of this consent, which adequately explains the benefits and risks of the Services being provided via telemedicine.  . I have been provided ample opportunity to ask questions regarding this consent and the Services and have had my questions answered to my satisfaction. . I give my informed consent for the services to be provided through the use of telemedicine in my medical care

## 2020-08-21 NOTE — Telephone Encounter (Signed)
RN spoke to patient. Instruction were given  from today's virtual visit 08/21/20 .  AVS SUMMARY has been sent by mychart  And mailed . Labslip, mailed discussed upcoming PFT scheduled appointment for March 04, 2021 at 4 pm    Patient verbalized understanding

## 2020-08-21 NOTE — Progress Notes (Signed)
Virtual Visit via Video Note   This visit type was conducted due to national recommendations for restrictions regarding the COVID-19 Pandemic (e.g. social distancing) in an effort to limit this patient's exposure and mitigate transmission in our community.  Due to her co-morbid illnesses, this patient is at least at moderate risk for complications without adequate follow up.  This format is felt to be most appropriate for this patient at this time.  All issues noted in this document were discussed and addressed.  A limited physical exam was performed with this format.  Please refer to the patient's chart for her consent to telehealth for [facility].  Patient has given verbal permission to conduct this visit via virtual appointment and to bill insurance 08/24/2020 2:06 AM     Evaluation Performed:  Follow-up visit  Date:  08/24/2020   ID:  Natasha Woodard, DOB 1940/12/14, MRN [id number]  Patient Location: Home Provider Location: Other:  Hospital Office  PCP:  Tonia Ghent, MD  Cardiologist:  Glenetta Hew, MD  Advanced Heart Failure Cardiologist: Dr. Haroldine Laws  Chief Complaint:   Chief Complaint  Patient presents with  . Follow-up    Doing well-just concerned about not being able to finish her full mile without stopping    History of Present Illness:    Natasha Woodard is a 79 y.o. female with PMH notable for NONISCHEMIC/DILATED CARDIOMYOPATHY RELATED TO FREQUENT PVCS not completely resolved with suppression of PVCs (EF improved from 25-30 % up to most recent 60-65% in January 2021) who presents via Engineer, civil (consulting) for a telehealth visit today as a 28-monthfollow-up..Marland Kitchen  CARDIAC PERTINENT HISTORY:  July 2018 diagnosed with dilated cardiomyopathy-EF 25 to 30%; Started on carvedilol, Entresto and spironolactone  ? Also noted to have frequent PVCs-17%. ? Right Left Heart Cath showed no CAD, PCWP of 16 and LVEDP of 26 mmHg. ? -> Referred to Dr. BHaroldine Laws(advanced  heart failure)  +> PVCs treated with amiodarone -> EF gradually progressed up to 35-40% by June 2019 and then by January 2021 was up to 60-65%.  (Amiodarone now maintenance dose 100 mg daily) ? Symptoms are essentially resolved.   Lasix discontinued after initiation of Jardiance.  COVID-19 infection in October (09/26/2019) -quarantined at home, did not require hospitalization after ER visit on 10/10/2019 (noted intractable cough congestion weakness with diarrhea abdominal pain, but went to the ER because of visual hallucinations)-> never became hypoxic. ->  2 more weeks of quarantine.  Gradually regaining strength.   CAOKI WEDEMEYERwas last seen by me on March 05, 2020 -> was doing relatively well. Back to her normal workout routine. Able to walk a whole lot without stopping now. Had not been updated before. Every now and then will stop by half likely to catch her breath but not usually. Then does a 30-minute on the seated elliptical trainer. No exertional dyspnea or palpitations. Very happy with results of echocardiogram. No requirement diuretics and starting Jardiance. --> No changes  Hospitalizations:  . None  She was seen by Dr. BHaroldine Lawson May 7 -> recommended attempting weight loss of 15 to 20 pounds. I (apparently sleep study did not show evidence of significant OSA, and requiring CPAP). No plans to further titrate spironolactone if his potassium roughly 5.0. On max dose Entresto close to max dose carvedilol. On maintenance dose amiodarone 100. -> Volume status was stable.  Plan was 653-monthollow-up after echo.   Recent - Interim CV studies:    The  following studies were reviewed today: . None:  Inerval History   ANNER Woodard is being seen today for follow-up doing pretty well.  Her blood pressures been doing well.  She is not really noticing any palpitations.  She still does her exercise 3 days a week at the [facility].  She tries to complete that 1 mile but feels like she is  laboring having to use a cane and has to stop maybe once or twice to complete the full mile.  Despite this, she is not noticing any chest pain.  She not having any significant edema.  No PND orthopnea.  No irregular heartbeats palpitations.  She has had relatively stable weights without any additional weight loss. Still goes to the [facility] and walks on the track for about 30+ minutes.  She tries to get at least 1 mile doing.  She also does some other machine exercises.  She asked about why we stopped spironolactone, I explained about the hyperkalemia.  Edema seems to have not changed since stopping.  Cardiovascular ROS: positive for - dyspnea on exertion, edema, palpitations and Exertional dyspnea is notably improved from initial symptoms, stable over the last 6 months.  Palpitations are rare and not bothersome.  Nothing rapid.  Edema stable.  negative for - chest pain, irregular heartbeat, orthopnea, paroxysmal nocturnal dyspnea, rapid heart rate, shortness of breath or Lightheadedness or dizziness, syncope/near syncope or TIA/amaurosis fugax.   ROS:  Please see the history of present illness.    The patient does not have symptoms concerning for COVID-19 infection (fever, chills, cough, or new shortness of breath).  Review of Systems  Constitutional: Positive for malaise/fatigue (She just feels tired easily, nothing specific) and weight loss (None recently, but has been slowly dropping).  HENT: Negative for congestion and nosebleeds.   Respiratory: Negative for cough and shortness of breath.   Cardiovascular: Negative for leg swelling (Stable).  Gastrointestinal: Negative for blood in stool and melena.  Genitourinary: Negative for hematuria.  Musculoskeletal: Positive for joint pain. Negative for falls.  Neurological: Positive for dizziness (Sometimes if she gets up too quickly or moves too fast.). Negative for focal weakness and weakness.  Psychiatric/Behavioral: Negative for memory loss. The  patient is not nervous/anxious and does not have insomnia.     The patient is practicing social distancing.  Past Medical History:  Diagnosis Date  . Calcific tendonitis 2008   Treatment of left leg  . Cardiomyopathy ([place]) 06/2017   a) Echo: EF 25-30%. GR 1-2 DD w/ elevated LVEDP. Mild valvular Dz; b) Cardiac MRI 2/'19: frequent PVCs (diffiuclt to interpret) - EF ~27% w/ diffuse HK.  No evidence of infarct, infiltrative Dz or myocarditis. -- ? if related to PVCs. c) f/u Echo 6/'19: EF 35-40%. Gr 1 DD. Diffuse HK.-> d) Jan 2020 EF 50 to 55%.  Mod MAC, mod LAE.  Ao Sclerosis; e) Echo 1/'21 - EF 60-65%, Gr II DD. Mild Mod MR.   . Chronic combined systolic and diastolic CHF, NYHA class 2 and ACA/AHA stage C    Now essentially resolved, back to simply diastolic CHF  . Coronary artery disease, non-occlusive    mild-moderate CAD 06/30/17 cath  . CTS (carpal tunnel syndrome)   . Diabetes mellitus    Type II  . Dysrhythmia    patient said that she cant remember what it is  . 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  Nighttime bradycardia suggestive  of OSA.  Marland Kitchen Hiatal hernia    with reflux  . Hyperlipidemia    Statin intolerant  . Hypertension   . Ichthyosis congenita   . NSVD (normal spontaneous vaginal delivery) 1971 & 1972  . Obesity   . Plantar fasciitis    Left  . Pulmonary nodule    Imaged multiple times and benign appearing  . Wears glasses    Immunization History  Administered Date(s) Administered  . Influenza Whole 08/12/2010  . Influenza,inj,Quad PF,6+ Mos 08/19/2014, 08/15/2015, 09/02/2016, 08/05/2019  . Influenza-Unspecified 09/11/2018  . PFIZER SARS-COV-2 Vaccination 02/09/2020, 02/29/2020  . Pneumococcal Conjugate-13 03/10/2015  . Pneumococcal Polysaccharide-23 05/26/2006  . Td 06/14/2007  . Zoster 08/10/2006    Past Surgical History:  Procedure Laterality Date  . ABDOMINAL HYSTERECTOMY   1975   TAH-BSO  . ABSCESS DRAINAGE  1972   right breast  . BREAST BIOPSY Left 01/14/2009   Stereo- Benign  . CARDIAC MRI  01/2018   Difficult to interpret 2/2 PVCs. Normal LV size - EF ~27% with diffuse HK. Mild RV dilation - normal fxn. -- NO MYOCARDIAL LGI => no definitive evidence of prior MI, Infiltrative Dz or Myocarditis -- suspect NICM, possibly related to PVCs.  . CARPAL TUNNEL RELEASE Right 07/02/2014   Procedure: RIGHT CARPAL TUNNEL RELEASE;  Surgeon: Wynonia Sours, MD;  Location: [place];  Service: Orthopedics;  Laterality: Right;  . CARPAL TUNNEL RELEASE Left 06/11/2015   Procedure: LEFT CARPAL TUNNEL RELEASE;  Surgeon: Daryll Brod, MD;  Location: [place];  Service: Orthopedics;  Laterality: Left;  REGIONAL/FAB  . COLONOSCOPY    . corn removal  1968   right  . Holter Monitor  06/2017   ~17,000 PVC beats - majority were singlets, some couplets. 44 brief 3-7 beat runs of NSVT. Also noted were less frequent PACs with 11 runs (longest 15 beats)  . OPEN REDUCTION INTERNAL FIXATION (ORIF) DISTAL RADIAL FRACTURE Left 07/21/2017   Procedure: OPEN REDUCTION INTERNAL FIXATION (ORIF) LEFT DISTAL RADIAL FRACTURE;  Surgeon: Renette Butters, MD;  Location: [place];  Service: Orthopedics;  Laterality: Left;  . RIGHT/LEFT HEART CATH AND CORONARY ANGIOGRAPHY N/A 06/30/2017   Procedure: Right/Left Heart Cath and Coronary Angiography;  Surgeon: Leonie Man, MD;  Location: [facility] INVASIVE CV LAB:  pRCA 55%, pCx 40%, OM1 45%, mCx 50%, D2 50% - LVEF 25-35%. Moderately elevated LVEDP(26 mmHg with PCWP 16 mmHg).  FICK CO/CI: 4.47/2.48. PA pressures 47/14 mmHg with a mean of 27 mm.  Marland Kitchen ROTATOR CUFF REPAIR     left shoulder  . TRANSTHORACIC ECHOCARDIOGRAM  06/2017   EF 25 and 30%. GR 1 DD. Mild diastolic dysfunction with elevated LVEDP. Mild valvular disease.  . TRANSTHORACIC ECHOCARDIOGRAM  11'18, 6/'19    a) EF remains 30-35%.  Diffuse hypokinesis noted.  Severe LA  dilation; b) Improved EF 35-40%.  Diffuse HK.  GR 1 DD.  Moderate TR.  Mild RV dilation.  . TRANSTHORACIC ECHOCARDIOGRAM  1/'20; 1/'21   a) EF 50 to 55%. Gr I DD. (Notabley improved LV Fxn.  Moderate LA dilation.  Mod MAC.  AoV sclerosis w/o stenosis;; b) EF 60 to 65%.  Normal LV size and function.  GRII DD.  Mild to moderate MR.  Normal PA pressures.  Marland Kitchen ZIO Patch 14 d Event Monitor  10/2018   Results as below <1% PVC.  (Notably improved after starting amiodarone)     Current Meds  Medication Sig  . amiodarone (PACERONE) 200  MG tablet TAKE 1/2 TABLET BY MOUTH EVERY DAY  . aspirin 81 MG tablet Take 1 tablet (81 mg total) by mouth daily.  . carvedilol (COREG) 25 MG tablet TAKE 1 TABLET BY MOUTH TWICE DAILY  . Cholecalciferol (VITAMIN D) 1000 UNITS capsule Take 1,000 Units by mouth daily.   . empagliflozin (JARDIANCE) 10 MG TABS tablet Take 5 mg by mouth daily before breakfast.  . furosemide (LASIX) 20 MG tablet TAKE 20MG TABLET EVERY MONDAY, WEDNESDAY, AND FRIDAY. IF WEIGHT IS MORE THAN 3LBS IN A DAY, TAKE AN EXTRA 20MG THAT DAY AS NEEDED  . glucose blood (ACCU-CHEK AVIVA PLUS) test strip USE TO TEST BLOOD SUGAR ONCE DAILY FOR DM E11.9  . Lancets (ACCU-CHEK MULTICLIX) lancets USE ONE LANCET TO TEST BLOOD GLUCOSE DAILY FOR DM 250.00  . lovastatin (MEVACOR) 40 MG tablet TAKE 1 TABLET BY MOUTH AT BEDTIME  . metFORMIN (GLUCOPHAGE) 500 MG tablet Take 0.5 tablets (250 mg total) by mouth 2 (two) times daily with a meal.  . sacubitril-valsartan (ENTRESTO) 97-103 MG Take 1 tablet by mouth 2 (two) times daily.  . traMADol (ULTRAM) 50 MG tablet TAKE 1 TABLET BY MOUTH EVERY 12 HOURS     Allergies:   Ezetimibe-simvastatin, Lipitor [atorvastatin], Tamiflu [oseltamivir phosphate], Pravastatin sodium, and Sulfonamide derivatives   Social History   Tobacco Use  . Smoking status: Former Smoker    Years: 28.00    Types: Cigarettes    Quit date: 12/12/1993    Years since quitting: 26.7  . Smokeless  tobacco: Never Used  Vaping Use  . Vaping Use: Never used  Substance Use Topics  . Alcohol use: No    Alcohol/week: 0.0 standard drinks  . Drug use: No     Family Hx: The patient's family history includes Cancer in her brother; Diabetes in her father and mother; Heart disease in her brother, father, and mother; Hypertension in an other family member. There is no history of Colon cancer or Breast cancer.   Labs/Other Tests and Data Reviewed:    EKG:  No ECG reviewed.  Recent Labs: 10/10/2019: B Natriuretic Peptide 263.3; Hemoglobin 12.9; Magnesium 1.6; Platelets 302 04/17/2020: ALT 7; TSH 2.148 04/24/2020: BUN 25; Creatinine, Ser 1.39; Potassium 5.1; Sodium 143   Recent Lipid Panel Lab Results  Component Value Date   CHOL 142 11/12/2019   HDL 39.80 11/12/2019   LDLCALC 85 11/12/2019   LDLDIRECT 53 08/10/2009   TRIG 86.0 11/12/2019   CHOLHDL 4 11/12/2019    Wt Readings from Last 3 Encounters:  08/21/20 171 lb 6.4 oz (77.7 kg)  07/02/20 174 lb (78.9 kg)  04/17/20 173 lb 9.6 oz (78.7 kg)     Objective:    Vital Signs:  BP 122/80   Ht _0  (1.575 m)   Wt 171 lb 6.4 oz (77.7 kg)   BMI 31.35 kg/m   VITAL SIGNS:  reviewed Well nourished, well developed female in NO acute distress. A&O x 3.  Normal Mood & Affect; as usual, very inquisitive and somewhat self-deprecating Non-labored respirations   ASSESSMENT & PLAN:    Problem List Items Addressed This Visit    Dilated cardiomyopathy ([place]) - Primary (Chronic)    Nonischemic (arrhythmia genic) dilated cardiomyopathy likely related to PVCs.  Essentially complete resolution with EF back to normal with control of PVCs of amiodarone.  Plan:   Continue maintenance dose of amiodarone 100 mg daily  We will need to check PFTs in the future, but for now we will check  ESR and CRP as well as LFTs and TSH.  (LFTs and TSH were normal by last check)  We reduced spironolactone last visit and then finally stopped because of  hyperkalemia.    Continue current doses of carvedilol and Entresto along with Jardiance.  Minimal diuretic requirement, taking furosemide 3 days a week and then as needed.  She has not required any additional doses.       Relevant Orders   Lipid panel   Comprehensive metabolic panel   C-reactive protein   Sedimentation rate   Frequent PVCs (Chronic)    Notably reduced with amiodarone management.  Tolerating reduced dose of amiodarone along with carvedilol.  Monitor for bradycardia.  Standard amiodarone monitoring.      Relevant Orders   Comprehensive metabolic panel   Chronic heart failure with preserved ejection fraction (HFpEF) (HCC) (Chronic)    Really only class I CHF symptoms.  I think her exertional dyspnea is probably more because of deconditioning, old age and obesity.  She still close having a hard time with her exercise, but is stable.  Is on excellent doses of Entresto, carvedilol and Jardiance.  We have stopped spironolactone.  She takes 3 days with Lasix with no additional dosing required.      Relevant Orders   Pulmonary Function Test   Lipid panel   Comprehensive metabolic panel   C-reactive protein   Sedimentation rate   HLD (hyperlipidemia) (Chronic)    Labs look pretty good as of last December.  Managed by PCP with lovastatin.  Tolerating relatively well.      Relevant Orders   Lipid panel   Essential hypertension (Chronic)    Blood pressures look great on Entresto and carvedilol.  No hypotension issues.      On amiodarone therapy    On reduced dose amiodarone, but still need standard monitoring.  We will try to avoid PFTs for now with no symptoms, will check sed rate and CRP. LFTs and TFTs were normal by last check.  Annual eye exams.       Relevant Orders   Pulmonary Function Test   Comprehensive metabolic panel   C-reactive protein   Sedimentation rate   Hyperkalemia    Labs look better after discontinuing spironolactone.  Baseline  potassium level is in the high fours to low fives.  Would not build to restart spironolactone.      Relevant Orders   Comprehensive metabolic panel      [id number] Education: The signs and symptoms of COVID-19 were discussed with the patient and how to seek care for testing (follow up with PCP or arrange E-visit).   The importance of social distancing was discussed today.  Time:   Today, I have spent 28 minutes with the patient with telehealth technology discussing the above problems.  Additional 8 minutes spent charting.  Total 36 minutes   Medication Adjustments/Labs and Tests Ordered: Current medicines are reviewed at length with the patient today.  Concerns regarding medicines are outlined above.   Patient Instructions  Medication Instructions:   No changes  *If you need a refill on your cardiac medications before your next appointment, please call your pharmacy*   Lab Work:  CMP/FLP, ESR and CRP (amiodarone)- fasting - follow instruction with accompanying labslip  If you have labs (blood work) drawn today and your tests are completely normal, you will receive your results only by: Marland Kitchen MyChart Message (if you have MyChart) OR . A paper copy in the mail If you have  any lab test that is abnormal or we need to change your treatment, we will call you to review the results.   Testing/Procedures: Will be schedule at [facility] [address]  - Nov 2021 You may need to have a covid test  3 days prior to test at [facility] ( drive thru). Information will be given when test is set up Pulmonary function test -amiodarone-  Your physician has recommended that you have a pulmonary function test. Pulmonary Function Tests are a group of tests that measure how well air moves in and out of your lungs.    Follow-Up: At [facility], you and your health needs are our priority.  As part of our continuing mission to provide you with exceptional heart care, we have  created designated Provider Care Teams.  These Care Teams include your primary Cardiologist (physician) and Advanced Practice Providers (APPs -  Physician Assistants and Nurse Practitioners) who all work together to provide you with the care you need, when you need it.    Your next appointment:   6 month(s)- March 2022  The format for your next appointment:   Can be virtual or in person  Provider:   Dr. Ellyn Hack   Other Instructions Keep staying active and walking.  Do not be discouraged by using a cane.  Try to, maybe do an additional day a week at the [facility].     Signed, Glenetta Hew, MD  08/24/2020 2:06 AM    [place]

## 2020-08-21 NOTE — Patient Instructions (Addendum)
Medication Instructions:  ° °No changes ° °*If you need a refill on your cardiac medications before your next appointment, please call your pharmacy* ° ° °Lab Work: ° °CMP/FLP, ESR and CRP (amiodarone)- fasting - follow instruction with accompanying labslip ° °If you have labs (blood work) drawn today and your tests are completely normal, you will receive your results only by: °• MyChart Message (if you have MyChart) OR °• A paper copy in the mail °If you have any lab test that is abnormal or we need to change your treatment, we will call you to review the results. ° ° °Testing/Procedures: °Will be schedule at [facility] [address]  - Nov 2021 °You may need to have a covid test  3 days prior to test at [address] ( drive thru). Information will be given when test is set up °Pulmonary function test -amiodarone-  Your physician has recommended that you have a pulmonary function test. Pulmonary Function Tests are a group of tests that measure how well air moves in and out of your lungs. ° ° ° °Follow-Up: °At [facility], you and your health needs are our priority.  As part of our continuing mission to provide you with exceptional heart care, we have created designated Provider Care Teams.  These Care Teams include your primary Cardiologist (physician) and Advanced Practice Providers (APPs -  Physician Assistants and Nurse Practitioners) who all work together to provide you with the care you need, when you need it. ° ° ° °Your next appointment:   °6 month(s)- March 2022 ° °The format for your next appointment:   °Can be virtual or in person ° °Provider:   °Dr.  ° ° °Other Instructions °Keep staying active and walking.  Do not be discouraged by using a cane.  Try to, maybe do an additional day a week at the [facility]. ° °

## 2020-08-24 ENCOUNTER — Encounter: Payer: Self-pay | Admitting: Cardiology

## 2020-08-24 NOTE — Assessment & Plan Note (Signed)
On reduced dose amiodarone, but still need standard monitoring.  We will try to avoid PFTs for now with no symptoms, will check sed rate and CRP. LFTs and TFTs were normal by last check.  Annual eye exams.

## 2020-08-24 NOTE — Assessment & Plan Note (Signed)
Really only class I CHF symptoms.  I think her exertional dyspnea is probably more because of deconditioning, old age and obesity.  She still close having a hard time with her exercise, but is stable.  Is on excellent doses of Entresto, carvedilol and Jardiance.  We have stopped spironolactone.  She takes 3 days with Lasix with no additional dosing required.

## 2020-08-24 NOTE — Assessment & Plan Note (Signed)
Nonischemic (arrhythmia genic) dilated cardiomyopathy likely related to PVCs.  Essentially complete resolution with EF back to normal with control of PVCs of amiodarone.  Plan:   Continue maintenance dose of amiodarone 100 mg daily  We will need to check PFTs in the future, but for now we will check ESR and CRP as well as LFTs and TSH.  (LFTs and TSH were normal by last check)  We reduced spironolactone last visit and then finally stopped because of hyperkalemia.    Continue current doses of carvedilol and Entresto along with Jardiance.  Minimal diuretic requirement, taking furosemide 3 days a week and then as needed.  She has not required any additional doses.

## 2020-08-24 NOTE — Assessment & Plan Note (Signed)
Labs look pretty good as of last December.  Managed by PCP with lovastatin.  Tolerating relatively well.

## 2020-08-24 NOTE — Assessment & Plan Note (Signed)
Notably reduced with amiodarone management.  Tolerating reduced dose of amiodarone along with carvedilol.  Monitor for bradycardia.  Standard amiodarone monitoring.

## 2020-08-24 NOTE — Assessment & Plan Note (Signed)
Blood pressures look great on Entresto and carvedilol.  No hypotension issues.

## 2020-08-24 NOTE — Assessment & Plan Note (Addendum)
Labs look better after discontinuing spironolactone.  Baseline potassium level is in the high fours to low fives.  Would not build to restart spironolactone.

## 2020-08-26 ENCOUNTER — Other Ambulatory Visit: Payer: Self-pay | Admitting: Critical Care Medicine

## 2020-08-26 ENCOUNTER — Other Ambulatory Visit: Payer: Medicare Other

## 2020-08-26 DIAGNOSIS — Z20822 Contact with and (suspected) exposure to covid-19: Secondary | ICD-10-CM

## 2020-08-28 LAB — NOVEL CORONAVIRUS, NAA: SARS-CoV-2, NAA: NOT DETECTED

## 2020-08-28 LAB — SARS-COV-2, NAA 2 DAY TAT

## 2020-09-02 ENCOUNTER — Other Ambulatory Visit: Payer: Self-pay | Admitting: Family Medicine

## 2020-09-02 DIAGNOSIS — K219 Gastro-esophageal reflux disease without esophagitis: Secondary | ICD-10-CM

## 2020-09-03 NOTE — Telephone Encounter (Signed)
Will file in chart as: omeprazole (PRILOSEC) 20 MG capsule    The original prescription was discontinued on 10/28/2019 by Adline Peals, CMA for the following reason: Error. Renewing this prescription may not be appropriate.   Sig: TAKE 1 CAPSULE(20 MG) BY MOUTH DAILY FOR HEARTBURN   Disp:  30 capsule  Refills:  5 (Pharmacy requested: Not specified)

## 2020-09-04 ENCOUNTER — Other Ambulatory Visit: Payer: Self-pay | Admitting: Family Medicine

## 2020-09-04 DIAGNOSIS — K219 Gastro-esophageal reflux disease without esophagitis: Secondary | ICD-10-CM

## 2020-09-06 NOTE — Telephone Encounter (Signed)
Please check with pharmacy.  I thought this medication had been discontinued in the meantime.  Thanks.

## 2020-09-07 DIAGNOSIS — M545 Low back pain: Secondary | ICD-10-CM | POA: Diagnosis not present

## 2020-09-08 ENCOUNTER — Other Ambulatory Visit: Payer: Self-pay | Admitting: *Deleted

## 2020-09-08 NOTE — Telephone Encounter (Signed)
Patient states she had stopped taking the medication since about a year ago because she didn't need it any longer.  However, now the problem has arisen again and her prescription has expired so she would like a new Rx sent to the designated pharmacy.

## 2020-09-08 NOTE — Telephone Encounter (Signed)
Erroneous encounter

## 2020-09-14 ENCOUNTER — Telehealth: Payer: Self-pay | Admitting: *Deleted

## 2020-09-14 NOTE — Telephone Encounter (Signed)
If she can bring any imaging/reports then that would be good.  Thanks.  I hope she is feeling better in the meantime.

## 2020-09-14 NOTE — Telephone Encounter (Signed)
Patient notified as instructed by telephone and verbalized understanding. 
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
## 2020-09-15 ENCOUNTER — Ambulatory Visit: Payer: Medicare Other | Attending: Internal Medicine

## 2020-09-15 ENCOUNTER — Telehealth: Payer: Self-pay

## 2020-09-15 DIAGNOSIS — Z23 Encounter for immunization: Secondary | ICD-10-CM

## 2020-09-15 NOTE — Progress Notes (Signed)
   Covid-19 Vaccination Clinic  Name:  Natasha Woodard    MRN: [id number] DOB: [birth year]  09/15/2020  Natasha Woodard was observed post Covid-19 immunization for 15 minutes without incident. She was provided with Vaccine Information Sheet and instruction to access the V-Safe system.   Natasha Woodard was instructed to call 911 with any severe reactions post vaccine: Marland Kitchen Difficulty breathing  . Swelling of face and throat  . A fast heartbeat  . A bad rash all over body  . Dizziness and weakness
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
## 2020-09-15 NOTE — Telephone Encounter (Signed)
Pt called back to say she was able to get it on a disc. She is wanting to make sure he can see it if he wants to to.

## 2020-09-17 ENCOUNTER — Ambulatory Visit: Payer: Medicare Other | Admitting: Family Medicine

## 2020-09-17 ENCOUNTER — Encounter: Payer: Self-pay | Admitting: Family Medicine

## 2020-09-17 ENCOUNTER — Ambulatory Visit (INDEPENDENT_AMBULATORY_CARE_PROVIDER_SITE_OTHER): Payer: Medicare Other | Admitting: Family Medicine

## 2020-09-17 ENCOUNTER — Other Ambulatory Visit: Payer: Self-pay

## 2020-09-17 DIAGNOSIS — R0789 Other chest pain: Secondary | ICD-10-CM
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
## 2020-09-17 NOTE — Patient Instructions (Addendum)
I would try taking tylenol for pain instead of ibuprofen.  Continue tramadol as needed, as you have been.  If the breast soreness doesn't get better daily then let me know.  We can send you for extra pictures if needed.   We'll request the records from ortho.  Take care.  Glad to see you. I expect you to gradually get better.

## 2020-09-23 DIAGNOSIS — M545 Low back pain, unspecified: Secondary | ICD-10-CM | POA: Diagnosis not present

## 2020-09-23 NOTE — Assessment & Plan Note (Signed)
I would try taking tylenol for pain instead of ibuprofen.  Continue tramadol as needed. If the breast soreness doesn't get better daily then she will let me know.  We can set follow-up imaging if needed but she is already improving so would defer at this point.  She agrees with plan.

## 2020-09-29 ENCOUNTER — Ambulatory Visit: Payer: Medicare Other | Admitting: Cardiology

## 2020-10-05 ENCOUNTER — Telehealth: Payer: Self-pay

## 2020-10-05 NOTE — Telephone Encounter (Signed)
Vm from pt stating she's had off and on dizziness and HA.    Spoke with pt asking about sxs.  Says sxs started 4-5 days ago.  BP was 142/70 10/02/20.  Offered OV with available provider.  Pt wants to see Dr. Para March.  Scheduled on 10/08/20 at 11:00.  In the interim, pt states she will increase water intake and monitor BP.  FYI to Dr. Para March.

## 2020-10-06 ENCOUNTER — Other Ambulatory Visit: Payer: Self-pay | Admitting: Family Medicine

## 2020-10-06 DIAGNOSIS — H25013 Cortical age-related cataract, bilateral: Secondary | ICD-10-CM | POA: Diagnosis not present

## 2020-10-06 DIAGNOSIS — E119 Type 2 diabetes mellitus without complications: Secondary | ICD-10-CM | POA: Diagnosis not present

## 2020-10-06 DIAGNOSIS — H2513 Age-related nuclear cataract, bilateral: Secondary | ICD-10-CM | POA: Diagnosis not present

## 2020-10-06 DIAGNOSIS — H40023 Open angle with borderline findings, high risk, bilateral: Secondary | ICD-10-CM | POA: Diagnosis not present

## 2020-10-06 LAB — HM DIABETES EYE EXAM

## 2020-10-06 NOTE — Telephone Encounter (Signed)
Noted. Thanks.

## 2020-10-07 ENCOUNTER — Telehealth: Payer: Self-pay | Admitting: Cardiology

## 2020-10-07 DIAGNOSIS — Z79899 Other long term (current) drug therapy: Secondary | ICD-10-CM

## 2020-10-07 DIAGNOSIS — I493 Ventricular premature depolarization: Secondary | ICD-10-CM

## 2020-10-07 NOTE — Telephone Encounter (Signed)
Called - left message to call back to discuss-  Concerning the after summary visit from 08/21/20

## 2020-10-07 NOTE — Telephone Encounter (Signed)
Patient calling to speak with Natasha Woodard. She states she has questions about her avs she received from her last appointment 08/21/20. She states the avs talks about testing and procedures being done at [facility] and a pulmonary function test. She states it also talks about lab work being released to [employer] [employer]. She states she does not understand what it is all saying.

## 2020-10-08 ENCOUNTER — Ambulatory Visit (INDEPENDENT_AMBULATORY_CARE_PROVIDER_SITE_OTHER): Payer: Medicare Other | Admitting: Family Medicine

## 2020-10-08 ENCOUNTER — Encounter: Payer: Self-pay | Admitting: Family Medicine

## 2020-10-08 ENCOUNTER — Other Ambulatory Visit: Payer: Self-pay

## 2020-10-08 ENCOUNTER — Telehealth: Payer: Self-pay | Admitting: Family Medicine

## 2020-10-08 VITALS — BP 142/70 | HR 69 | Temp 95.9°F | Ht 62.0 in | Wt 173.2 lb

## 2020-10-08 DIAGNOSIS — R42 Dizziness and giddiness: Secondary | ICD-10-CM | POA: Diagnosis not present

## 2020-10-08 DIAGNOSIS — E119 Type 2 diabetes mellitus without complications: Secondary | ICD-10-CM

## 2020-10-08 NOTE — Telephone Encounter (Signed)
Pt called in retuning [name] calls.  Best number [phone]

## 2020-10-08 NOTE — Telephone Encounter (Signed)
[name] @ heartcare wanted you to call her before the end of the day

## 2020-10-08 NOTE — Telephone Encounter (Signed)
Call returned to Pt.  Advised that her first step is to go to [facility] at [facility] for fasting labs.  She denies having any lab slips.  Advised after labs she needs PFT's.  Original order was placed for PFT's at [facility].  [facility] does not do PFT's.  Reentered order for PFT's at [facility].  Advised Pt that she would not need a covid test prior to PFT's because Pt is fully vaccinated.  Advised to bring immunization card to PFT appt.  Advised she would be contacted by [facility] to schedule PFT's.  Pt indicates understanding.

## 2020-10-08 NOTE — Telephone Encounter (Signed)
Order changed. Thanks!

## 2020-10-08 NOTE — Telephone Encounter (Signed)
Natasha Woodard at [facility] called and Natasha Woodard is going to [facility] for her lab work and they use [facility] so the orders need to be changed to [facility] and [facility] before tomorrow morning.

## 2020-10-08 NOTE — Progress Notes (Signed)
This visit occurred during the SARS-CoV-2 public health emergency.  Safety protocols were in place, including screening questions prior to the visit, additional usage of staff PPE, and extensive cleaning of exam room while observing appropriate contact time as indicated for disinfecting solutions.  She feels episodically lightheaded.  She doesn't have room spinning.  Sx are clearly better in the meantime but minimally present episodically.  She has some occ dull HA.  She is on MWF lasix at baseline but not on other days.  She noted sx more in the AMs generally, prior to taking AM meds.  No CP.  Not SOB.  No BLE edema.    Meds, vitals, and allergies reviewed.   ROS: Per HPI unless specifically indicated in ROS section   GEN: nad, alert and oriented HEENT: ncat NECK: supple w/o LA CV: rrr.  PULM: ctab, no inc wob ABD: soft, +bs EXT: no edema SKIN: Chronic changes noted at baseline

## 2020-10-08 NOTE — Patient Instructions (Signed)
See if they can collect the labs for cardiology and my labs from [facility] at the visit tomorrow.  I put in the orders.    I wouldn't change your meds at this point but if you keep having trouble with lightheadedness, then skip one day of furosemide.  See if that helps and let me know.    Update me as needed.  Take care.  Glad to see you.

## 2020-10-09 ENCOUNTER — Other Ambulatory Visit: Payer: Self-pay

## 2020-10-09 ENCOUNTER — Telehealth: Payer: Self-pay | Admitting: Physician Assistant

## 2020-10-09 DIAGNOSIS — Z79899 Other long term (current) drug therapy: Secondary | ICD-10-CM | POA: Diagnosis not present

## 2020-10-09 DIAGNOSIS — I5032 Chronic diastolic (congestive) heart failure: Secondary | ICD-10-CM | POA: Diagnosis not present

## 2020-10-09 DIAGNOSIS — I493 Ventricular premature depolarization: Secondary | ICD-10-CM | POA: Diagnosis not present

## 2020-10-09 DIAGNOSIS — I420 Dilated cardiomyopathy: Secondary | ICD-10-CM | POA: Diagnosis not present

## 2020-10-09 DIAGNOSIS — E119 Type 2 diabetes mellitus without complications: Secondary | ICD-10-CM

## 2020-10-09 DIAGNOSIS — E782 Mixed hyperlipidemia: Secondary | ICD-10-CM | POA: Diagnosis not present

## 2020-10-09 NOTE — Telephone Encounter (Signed)
Received critical lab result from [place] at [phone].  Discussed with Dr. Gala Romney who recommended we draw blood work tonight.  Dr. Gala Romney called the patient 4 times and called her husband twice.  He left a message instructing the patient to either go to urgent care or ED to have lab work redrawn tonight to double check if hyperkalemia is real.  If potassium is truly 6.0, may need to hold Entresto.

## 2020-10-10 ENCOUNTER — Telehealth: Payer: Self-pay | Admitting: Cardiology

## 2020-10-10 ENCOUNTER — Encounter (HOSPITAL_COMMUNITY): Payer: Self-pay | Admitting: *Deleted

## 2020-10-10 ENCOUNTER — Emergency Department (HOSPITAL_COMMUNITY)
Admission: EM | Admit: 2020-10-10 | Discharge: 2020-10-10 | Disposition: A | Discharge status: Home / Self Care | Payer: Medicare Other | Attending: Emergency Medicine | Admitting: Emergency Medicine

## 2020-10-10 ENCOUNTER — Other Ambulatory Visit: Payer: Self-pay

## 2020-10-10 DIAGNOSIS — R799 Abnormal finding of blood chemistry, unspecified: Secondary | ICD-10-CM | POA: Diagnosis not present

## 2020-10-10 DIAGNOSIS — Z5321 Procedure and treatment not carried out due to patient leaving prior to being seen by health care provider: Secondary | ICD-10-CM | POA: Insufficient documentation

## 2020-10-10 LAB — CBC WITH DIFFERENTIAL/PLATELET
Basophils Absolute: 0.1 10*3/uL (ref 0.0–0.2)
Basos: 1 %
EOS (ABSOLUTE): 0.2 10*3/uL (ref 0.0–0.4)
Eos: 5 %
Hematocrit: 44.9 % (ref 34.0–46.6)
Hemoglobin: 14.2 g/dL (ref 11.1–15.9)
Immature Grans (Abs): 0 10*3/uL (ref 0.0–0.1)
Immature Granulocytes: 0 %
Lymphocytes Absolute: 1.1 10*3/uL (ref 0.7–3.1)
Lymphs: 28 %
MCH: 28.9 pg (ref 26.6–33.0)
MCHC: 31.6 g/dL (ref 31.5–35.7)
MCV: 91 fL (ref 79–97)
Monocytes Absolute: 0.4 10*3/uL (ref 0.1–0.9)
Monocytes: 9 %
Neutrophils Absolute: 2.2 10*3/uL (ref 1.4–7.0)
Neutrophils: 57 %
Platelets: 379 10*3/uL (ref 150–450)
RBC: 4.92 x10E6/uL (ref 3.77–5.28)
RDW: 13.4 % (ref 11.7–15.4)
WBC: 3.9 10*3/uL (ref 3.4–10.8)

## 2020-10-10 LAB — COMPREHENSIVE METABOLIC PANEL
ALT: 7 IU/L (ref 0–32)
ALT: 6 U/L (ref 0–44)
AST: 12 IU/L (ref 0–40)
AST: 19 U/L (ref 15–41)
Albumin/Globulin Ratio: 1.7 (ref 1.2–2.2)
Albumin: 4.1 g/dL (ref 3.7–4.7)
Albumin: 3.5 g/dL (ref 3.5–5.0)
Alkaline Phosphatase: 65 IU/L (ref 44–121)
Alkaline Phosphatase: 58 U/L (ref 38–126)
Anion gap: 9 (ref 5–15)
BUN/Creatinine Ratio: 17 (ref 12–28)
BUN: 20 mg/dL (ref 8–27)
BUN: 22 mg/dL (ref 8–23)
Bilirubin Total: 0.3 mg/dL (ref 0.0–1.2)
CO2: 28 mmol/L (ref 20–29)
CO2: 28 mmol/L (ref 22–32)
Calcium: 10.1 mg/dL (ref 8.7–10.3)
Calcium: 9.3 mg/dL (ref 8.9–10.3)
Chloride: 102 mmol/L (ref 96–106)
Chloride: 103 mmol/L (ref 98–111)
Creatinine, Ser: 1.21 mg/dL — ABNORMAL HIGH (ref 0.57–1.00)
Creatinine, Ser: 1.35 mg/dL — ABNORMAL HIGH (ref 0.44–1.00)
GFR calc Af Amer: 49 mL/min/{1.73_m2} — ABNORMAL LOW (ref >59)
GFR calc non Af Amer: 42 mL/min/{1.73_m2} — ABNORMAL LOW (ref >59)
GFR, Estimated: 40 mL/min — ABNORMAL LOW (ref >60)
Globulin, Total: 2.4 g/dL (ref 1.5–4.5)
Glucose, Bld: 154 mg/dL — ABNORMAL HIGH (ref 70–99)
Glucose: 99 mg/dL (ref 65–99)
Potassium: 6 mmol/L (ref 3.5–5.2)
Potassium: 4.2 mmol/L (ref 3.5–5.1)
Sodium: 141 mmol/L (ref 134–144)
Sodium: 140 mmol/L (ref 135–145)
Total Bilirubin: 0.7 mg/dL (ref 0.3–1.2)
Total Protein: 6.5 g/dL (ref 6.0–8.5)
Total Protein: 6.1 g/dL — ABNORMAL LOW (ref 6.5–8.1)

## 2020-10-10 LAB — SEDIMENTATION RATE: Sed Rate: 6 mm/hr (ref 0–40)

## 2020-10-10 LAB — LIPID PANEL
Chol/HDL Ratio: 3.3 ratio (ref 0.0–4.4)
Cholesterol, Total: 139 mg/dL (ref 100–199)
HDL: 42 mg/dL (ref >39)
LDL Chol Calc (NIH): 80 mg/dL (ref 0–99)
Triglycerides: 89 mg/dL (ref 0–149)
VLDL Cholesterol Cal: 17 mg/dL (ref 5–40)

## 2020-10-10 LAB — HEMOGLOBIN A1C
Est. average glucose Bld gHb Est-mCnc: 134 mg/dL
Hgb A1c MFr Bld: 6.3 % — ABNORMAL HIGH (ref 4.8–5.6)

## 2020-10-10 LAB — C-REACTIVE PROTEIN: CRP: 1 mg/L (ref 0–10)

## 2020-10-10 NOTE — ED Notes (Signed)
Pt came up tto this nurse and said she was gong home bc she could lok on my chart for her test result. She did not want to stay

## 2020-10-10 NOTE — Telephone Encounter (Signed)
Thanks. Sounds like labs from [facility] have had some issues related to K measurement.

## 2020-10-10 NOTE — Telephone Encounter (Signed)
Left message with pt that K+ was normal and continue entresto.

## 2020-10-10 NOTE — ED Triage Notes (Signed)
The pt had lab work drawn yesterday and was called last pm and was told her pot  Was high 6.0  Told to come to ed tohave it checked  No pain anywhere

## 2020-10-11 DIAGNOSIS — R42 Dizziness and giddiness: Secondary | ICD-10-CM | POA: Insufficient documentation

## 2020-10-11 NOTE — Assessment & Plan Note (Signed)
See notes on follow-up labs. 

## 2020-10-11 NOTE — Assessment & Plan Note (Signed)
Discussed options.  No symptoms of time of exam. I wouldn't change her meds at this point but if she keeps having trouble with lightheadedness, then skip one day of furosemide.  I want her to see if that helps and let me know.  She agrees with plan.  She is going to have labs drawn at outside clinic so I put in an order for labs related to diabetes to be drawn at the same time so she would only have 1 lab visit.

## 2020-10-14 ENCOUNTER — Encounter: Payer: Self-pay | Admitting: Family Medicine

## 2020-10-14 ENCOUNTER — Telehealth: Payer: Self-pay | Admitting: Cardiology

## 2020-10-14 NOTE — Telephone Encounter (Signed)
New message:    Patient calling to get results from last week. Please call patient back.

## 2020-10-15 ENCOUNTER — Telehealth: Payer: Self-pay

## 2020-10-15 NOTE — Telephone Encounter (Signed)
I don't see any reason to suspect a mistake.  Patients can have a completely correct blood draw but the red cells can occ leak potassium into the serum during the collection process.  That can occ give a falsely elevated K reading, even if everything was done perfectly.  In that situation, it makes sense to repeat the level.  If it is normal on repeat, then that is likely what happened.  This appears to be her case.

## 2020-10-15 NOTE — Telephone Encounter (Signed)
I don't have anything else to add.  I will route to cardiology for any possible input.  Thanks.

## 2020-10-15 NOTE — Telephone Encounter (Signed)
Pt said 10/09/20 pt thinks Dr Para March put in lab orders to be done at [facility]. Pt said the K+ was very high at 6.0 and pt got call from [name] who sent pt to ED due to high K level. Pt said the repeat K was 4.2. Pt wants to know what happened; pt wants to know if there was a mistake made and if so why. Pt wants to know if there was a mistake was the other lab test values compromised or affected.  Pt also has call into cardiology but has not heard back yet.pt request cb with Dr Lianne Bushy response.

## 2020-10-15 NOTE — Telephone Encounter (Signed)
Returned call to pt she states that she received a call from the on-call MD about her high K+ and she went to the hospital and had it re-drawn and all is good now. She would like to know if she is going to be charged for the re-draw? Also she would like the rest of her results from Dr Herbie Baltimore. Informed pt that he is DOD today and would forward message to him/nurse. Pt thanks for the call.

## 2020-10-15 NOTE — Telephone Encounter (Signed)
I called the patient and spoke with her and explained to her about the potassium and she stated she was ot doing another lab because her insurance would not pay for a third one. Patient stated that she also feels it was in the way the blood was draw that's the problem.  Ai informed her that I would relay message to provider  Lorette Peterkin,cma

## 2020-10-15 NOTE — Telephone Encounter (Signed)
Follow-up:   Patient calling back because she did not get a return call yesterday. She would like to discuss her recent lab results. Please advise

## 2020-10-19 NOTE — Telephone Encounter (Signed)
Patient following up. Requesting a call back from [place].

## 2020-10-19 NOTE — Telephone Encounter (Signed)
Spoke with patient. Patient wants to know if she will be charged twice for her blood draw. Patient wants a better explanation of what the numbers mean in her blood work and to explain if mistakes were made and that is why she had to have her labs redrawn. Patient is requesting a call from nurse [name].

## 2020-10-29 ENCOUNTER — Telehealth: Payer: Self-pay | Admitting: Physician Assistant

## 2020-10-29 NOTE — Telephone Encounter (Signed)
Called patient and reviewed results. She still wants an appt with Dr. Herbie Baltimore to review her results. Made an appt Jan 21, first available.

## 2020-11-17 ENCOUNTER — Telehealth: Payer: Self-pay | Admitting: *Deleted

## 2020-11-17 NOTE — Telephone Encounter (Signed)
PNA vaccine up to date.  Would usually defer colonoscopy after age 80.  Thanks.

## 2020-11-17 NOTE — Telephone Encounter (Signed)
Called and spoke with patient regarding colonoscopy and pneumonia vaccine. Patient verbalized understanding.

## 2020-11-17 NOTE — Telephone Encounter (Signed)
Patient left a voicemail wanting to know when she is due a colonoscopy and will she be notified by someone when it is due.  Patient also wants to know if she needs to have a pneumonia vaccine. Patient requested a call back with this information.

## 2020-11-22 ENCOUNTER — Other Ambulatory Visit: Payer: Self-pay | Admitting: Family Medicine

## 2020-11-23 NOTE — Telephone Encounter (Signed)
SHE IS COMPLETELY OUT

## 2020-11-23 NOTE — Telephone Encounter (Signed)
Patient left a voicemail requesting a refill on Tramadol. Patient stated that she is completely out and would like for it to be refilled today. Last office visit 10/08/20 Last refill 05/17/20 #60/5 No upcoming appointment scheduled

## 2020-11-23 NOTE — Telephone Encounter (Signed)
Sent. Thanks.   

## 2020-11-24 ENCOUNTER — Encounter: Payer: Self-pay | Admitting: Family Medicine

## 2020-11-26 ENCOUNTER — Telehealth (HOSPITAL_COMMUNITY): Payer: Self-pay | Admitting: Pharmacy Technician

## 2020-11-26 NOTE — Telephone Encounter (Signed)
It's time to re-enroll patient to receive medication assistance for Entresto from [employer]. Left voicemail for patient to call me back so that we can start the re-enrollment process.  Bellarae Lizer F Diamond Martucci, CPhT     

## 2020-11-26 NOTE — Telephone Encounter (Signed)
Spoke to patient regarding re-enrollment of Entresto assistance with [employer]. Will have the application at the check in desk for the patient to sign.   Will fax once signatures are obtained.

## 2020-11-27 NOTE — Telephone Encounter (Signed)
Sent in application via fax.  Will follow up.  

## 2020-12-02 NOTE — Telephone Encounter (Signed)
Sent in application via fax.  Will follow up.  

## 2020-12-15 ENCOUNTER — Other Ambulatory Visit: Payer: Self-pay | Admitting: Family Medicine

## 2020-12-15 NOTE — Telephone Encounter (Signed)
Please Advise on this 

## 2020-12-16 NOTE — Telephone Encounter (Signed)
I sent the refill for coreg.  I need the long term rx for amiodarone to come to cardiology.  I routed this to Dr. Herbie Baltimore, with appreciation for his effort.

## 2020-12-17 ENCOUNTER — Telehealth: Payer: Self-pay

## 2020-12-17 NOTE — Telephone Encounter (Signed)
Patient called left message that she was informed when she had her home visit with medicare nurse. Was told that she has glucose in her urine and wanted to know what she needs to do. I have called patient back and l/m to call office. We will need to set up appointment to follow up with Dr. Para March.

## 2020-12-18 NOTE — Telephone Encounter (Signed)
She has an apt on 12/25/20

## 2020-12-21 NOTE — Telephone Encounter (Signed)
Patient called back about the refill on Amiodarone. Advised patient per Dr. Lianne Bushy note  this needs to come from Dr. Herbie Baltimore. Advised patient this request will be sent to him.

## 2020-12-22 ENCOUNTER — Telehealth (HOSPITAL_COMMUNITY): Payer: Self-pay | Admitting: Pharmacist

## 2020-12-22 ENCOUNTER — Telehealth: Payer: Self-pay | Admitting: Cardiology

## 2020-12-22 MED ORDER — FUROSEMIDE 20 MG PO TABS
20.0000 mg | ORAL_TABLET | ORAL | 6 refills | Status: DC
Start: 2020-12-23 — End: 2021-03-19

## 2020-12-22 MED ORDER — EMPAGLIFLOZIN 10 MG PO TABS
ORAL_TABLET | ORAL | 11 refills | Status: DC
Start: 2020-12-22 — End: 2021-01-06

## 2020-12-22 MED ORDER — AMIODARONE HCL 200 MG PO TABS
100.0000 mg | ORAL_TABLET | Freq: Every day | ORAL | 1 refills | Status: DC
Start: 2020-12-22 — End: 2021-08-30

## 2020-12-22 NOTE — Telephone Encounter (Signed)
Patient called and states there was a mix-up about what medications Dr. Herbie Baltimore are supposed to write and what her PCP Dr. Para March was supposed to write. Advised the patient that many of her medications had been written by Dr. Gala Romney. Transferred patient to Dr. [place] Gum office

## 2020-12-22 NOTE — Telephone Encounter (Signed)
Refills for amiodarone, Jardiance and furosemide sent to [facility] per patient request.

## 2020-12-23 ENCOUNTER — Telehealth: Payer: Self-pay | Admitting: Family Medicine

## 2020-12-23 MED ORDER — TRAMADOL HCL 50 MG PO TABS: 50.0000 mg | ORAL_TABLET | Freq: Two times a day (BID) | ORAL | 1 refills | Status: DC

## 2020-12-23 NOTE — Telephone Encounter (Signed)
Patient notified by telephone that script has been sent. 

## 2020-12-23 NOTE — Telephone Encounter (Signed)
Patient called.  Patient called [facility] to get Tramadol refilled.  [facility] has been out of Tramadol for the last 3 days. They said they might get it in today.  Patient said she took her last pill this morning.  Patient wants to know if a new rx can be sent to [facility] They have the Tramadol.  Patient wants to know if this can be done today, because she's due for her next pill tonight.  I'm sending this message to Dr.Duncan and Dr.G.

## 2020-12-23 NOTE — Telephone Encounter (Signed)
Sent. Thanks.   

## 2020-12-25 ENCOUNTER — Ambulatory Visit (INDEPENDENT_AMBULATORY_CARE_PROVIDER_SITE_OTHER): Payer: Medicare Other | Admitting: Family Medicine

## 2020-12-25 ENCOUNTER — Encounter: Payer: Self-pay | Admitting: Family Medicine

## 2020-12-25 ENCOUNTER — Other Ambulatory Visit: Payer: Self-pay

## 2020-12-25 VITALS — BP 142/76 | HR 57 | Temp 98.3°F | Ht 62.0 in | Wt 172.0 lb

## 2020-12-25 DIAGNOSIS — E119 Type 2 diabetes mellitus without complications: Secondary | ICD-10-CM | POA: Diagnosis not present

## 2020-12-25 LAB — POCT GLYCOSYLATED HEMOGLOBIN (HGB A1C): Hemoglobin A1C: 6 % — AB (ref 4.0–5.6)

## 2020-12-25 MED ORDER — LOVASTATIN 40 MG PO TABS
40.0000 mg | ORAL_TABLET | ORAL | Status: DC
Start: 2020-12-25 — End: 2021-02-15

## 2020-12-25 NOTE — Patient Instructions (Signed)
Your A1c is fine.  If you have any troubles with low sugars (often below 80) then let me know.   Plan on recheck in about 3-4 months with labs at the visit.  Take care.  Glad to see you.

## 2020-12-25 NOTE — Progress Notes (Signed)
This visit occurred during the SARS-CoV-2 public health emergency.  Safety protocols were in place, including screening questions prior to the visit, additional usage of staff PPE, and extensive cleaning of exam room while observing appropriate contact time as indicated for disinfecting solutions.  Diabetes:  Using medications without difficulties: yes Hypoglycemic episodes:no Hyperglycemic episodes:no Feet problems:  no Blood Sugars averaging: 80-100 eye exam within last year: yes A1c done at OV.    She is taking lovastatin every other day. She is able to tolerate that.  She previously had some foot pain but that has resolved in the meantime. No symptoms now.  She has 1 more refill on her tramadol. I had sent in with 1 refill given that she had to change pharmacies. This is a short-term option.  Meds, vitals, and allergies reviewed.  ROS: Per HPI unless specifically indicated in ROS section   GEN: nad, alert and oriented HEENT: NCAT NECK: supple w/o LA CV: rrr. PULM: ctab, no inc wob ABD: soft, +bs EXT: no edema SKIN: chronic changes at baseline.    Diabetic foot exam: Normal inspection No skin breakdown No calluses  Normal DP pulses Normal sensation to light touch and monofilament Nails normal  31 minutes were devoted to patient care in this encounter (this includes time spent reviewing the patient's file/history, interviewing and examining the patient, counseling/reviewing plan with patient).

## 2020-12-28 NOTE — Assessment & Plan Note (Signed)
A1c controlled. No change in meds at this point but cautions given regarding hypoglycemia. See after visit summary. Recheck periodically, recheck here in the office in a few months. We can do A1c at the visit. Continue Jardiance and metformin for now.

## 2020-12-30 NOTE — Telephone Encounter (Addendum)
Agreed.  Thanks. Per EMR, her prescription for amiodarone was addressed by cardiology.

## 2021-01-01 ENCOUNTER — Ambulatory Visit: Payer: Medicare Other | Admitting: Cardiology

## 2021-01-05 ENCOUNTER — Other Ambulatory Visit (HOSPITAL_COMMUNITY): Payer: Self-pay | Admitting: *Deleted

## 2021-01-05 MED ORDER — ENTRESTO 97-103 MG PO TABS: 1.0000 | ORAL_TABLET | Freq: Two times a day (BID) | ORAL | 1 refills | Status: DC

## 2021-01-06 ENCOUNTER — Telehealth (HOSPITAL_COMMUNITY): Payer: Self-pay | Admitting: Cardiology

## 2021-01-06 ENCOUNTER — Other Ambulatory Visit (HOSPITAL_COMMUNITY): Payer: Self-pay | Admitting: Internal Medicine

## 2021-01-06 NOTE — Telephone Encounter (Signed)
Pt called triage to report she had not heard from medication assistance program regarding entresto, patient is due for refills soon.  Advised would follow up with pharmacy team and possibly provide samples if needed   Pt voiced understanding and appreciative of returned call.    @pharmacy  team- can we check the status of the [employer] application and let me know if ok to provide samples. Thanks

## 2021-01-07 ENCOUNTER — Telehealth (HOSPITAL_COMMUNITY): Payer: Self-pay | Admitting: Pharmacy Technician

## 2021-01-07 NOTE — Telephone Encounter (Signed)
The patient called and left another message regarding her Entresto. I called her back and left another message, explaining that she would need to call [employer] to send her another refill. If she tells them she is about to run out, they will over night the medication to her.

## 2021-01-07 NOTE — Telephone Encounter (Signed)
Called [employer] to check the status of the patient's application. Representative stated that the company received everything they needed from the patient. They are not done processing her application.   Called and left the patient a message with the update. Advised her to call [employer], they will send her a refill while they are processing her application.

## 2021-01-18 NOTE — Telephone Encounter (Signed)
Received a message from the patient that [employer] has not received her application. This information is not correct. Called [employer] to check the status of the patient's application. Representative stated that the patient's application is still under review.   Called and left the patient a vm stating that they hope to have her approval done by mid Feb and that they have everything necessary. Patient is in the insurance verification of the application. If she needs a refill, she would need to call and request one. They will provide this for her while reviewing her re-enrollment application.

## 2021-01-25 ENCOUNTER — Ambulatory Visit: Payer: Medicare Other | Admitting: Family Medicine

## 2021-01-25 ENCOUNTER — Telehealth: Payer: Self-pay

## 2021-01-25 NOTE — Telephone Encounter (Signed)
[facility] Night - Client Nonclinical Telephone Record AccessNurse Client [facility] Night - Client Client [facility] - Night Physician Raechel Ache - MD Contact Type Call Who Is Calling Patient / Member / Family / Caregiver Caller Name Ziyah Cordoba Caller Phone Number [phone] Patient Name Natasha Woodard Patient DOB 09/04/1941 Call Type Message Only Information Provided Reason for Call Request to [facility] Appointment Initial Comment Caller states she needs to cancel her 300pm appt for today. Disp. Time Disposition Final User 01/25/2021 7:51:19 AM General Information Provided Yes Josephina Shih Call Closed By: Josephina Shih Transaction Date/Time: 01/25/2021 7:49:25 AM (ET)

## 2021-01-25 NOTE — Telephone Encounter (Signed)
Per appt notes appt has already been cancelled.

## 2021-01-29 ENCOUNTER — Telehealth (HOSPITAL_COMMUNITY): Payer: Self-pay | Admitting: Pharmacist

## 2021-01-29 NOTE — Telephone Encounter (Signed)
Advanced Heart Failure Patient Advocate Encounter   Patient was approved to receive Entresto from [employer]   Patient ID: [id number]  Effective dates: 01/26/2021 through 12/11/2021   Karle Plumber, PharmD, BCPS, BCCP, CPP Heart Failure Clinic Pharmacist [phone]

## 2021-02-10 ENCOUNTER — Telehealth: Payer: Self-pay | Admitting: Family Medicine

## 2021-02-10 NOTE — Telephone Encounter (Signed)
LVM for pt to rtn my call to schedule awv with nha. 

## 2021-02-13 ENCOUNTER — Other Ambulatory Visit: Payer: Self-pay | Admitting: Family Medicine

## 2021-02-15 ENCOUNTER — Ambulatory Visit: Payer: Medicare Other | Admitting: Family Medicine

## 2021-02-15 NOTE — Telephone Encounter (Signed)
Pharmacy requests refill on: Lovastatin 40 mg   LAST REFILL: 12/25/2020 LAST OV: 12/25/2020 NEXT OV: 02/17/2021 PHARMACY: [id number] [place], [place]

## 2021-02-17 ENCOUNTER — Ambulatory Visit (INDEPENDENT_AMBULATORY_CARE_PROVIDER_SITE_OTHER): Payer: Medicare Other | Admitting: Family Medicine

## 2021-02-17 ENCOUNTER — Encounter: Payer: Self-pay | Admitting: Family Medicine

## 2021-02-17 ENCOUNTER — Other Ambulatory Visit: Payer: Self-pay

## 2021-02-17 VITALS — BP 140/78 | HR 66 | Temp 97.5°F | Ht 62.0 in | Wt 173.4 lb

## 2021-02-17 DIAGNOSIS — Q809 Congenital ichthyosis, unspecified: Secondary | ICD-10-CM

## 2021-02-17 DIAGNOSIS — R21 Rash and other nonspecific skin eruption: Secondary | ICD-10-CM | POA: Diagnosis not present

## 2021-02-17 NOTE — Assessment & Plan Note (Addendum)
No rash appreciated at previous sites. I did ask her to call us next time this happens for sooner appt while in active exacerbation.  She does have evidence of tinea pedis due to skin maceration between toes of foot - discussed food care, recommend lotrimin cream BID x 2-4 wks, update if not improving with this. She agrees with plan.

## 2021-02-17 NOTE — Progress Notes (Signed)
Patient ID: CIRCE CHILTON, female    DOB: 03/02/1941, 80 y.o.   MRN: [id number]  This visit was conducted in person.  BP 140/78   Pulse 66   Temp (!) 97.5 F (36.4 C) (Temporal)   Ht 5\' 2"  (1.575 m)   Wt 173 lb 6 oz (78.6 kg)   SpO2 96%   BMI 31.71 kg/m    CC: itchy skin Subjective:   HPI: NIKCOLE EISCHEID is a 80 y.o. female presenting on 02/17/2021 for Skin Problem (C/o areas extremely itchy whelps on torso and between toes.  Started 2.5 wks ago.  Areas have since cleared up. )   2.5 wk h/o itchy rash that started at R shoulder - red puffy streak. Then had L rash patch popped up on left chest wall, both have since resolved. Rash usually lasts about 10 days. Now also noticing itchy rash between toes late last week - also very itchy. She has tried topical itch cream as well as monistat without benefit. H/o ichthyosis at baseline.   She has started using new water filtering system.   No new medicines or vitamins or supplements.  No new foods.  No new lotions, detergents, soaps or shampoos.  She's been using black soap from 04/19/2021 for the past several years, but diesn't know ingredients.   This is similar to previous rash about 1 year ago saw PCP ?intertrigo.      Relevant past medical, surgical, family and social history reviewed and updated as indicated. Interim medical history since our last visit reviewed. Allergies and medications reviewed and updated. Outpatient Medications Prior to Visit  Medication Sig Dispense Refill  . amiodarone (PACERONE) 200 MG tablet TAKE 1/2 TABLET BY MOUTH EVERY DAY 45 tablet 0  . amiodarone (PACERONE) 200 MG tablet Take 0.5 tablets (100 mg total) by mouth daily. 45 tablet 1  . aspirin 81 MG tablet Take 1 tablet (81 mg total) by mouth daily.    . carvedilol (COREG) 25 MG tablet TAKE 1 TABLET BY MOUTH TWICE DAILY 180 tablet 3  . Cholecalciferol (VITAMIN D) 1000 UNITS capsule Take 1,000 Units by mouth daily.    . furosemide (LASIX) 20 MG  tablet Take 1 tablet (20 mg total) by mouth every Monday, Wednesday, and Friday. 20 tablet 6  . glucose blood (ACCU-CHEK AVIVA PLUS) test strip USE TO TEST BLOOD SUGAR ONCE DAILY FOR DM E11.9 100 each 1  . JARDIANCE 10 MG TABS tablet TAKE 1/2 TABLET BY MOUTH DAILY BEFORE BREAKFAST 15 tablet 11  . Lancets (ACCU-CHEK MULTICLIX) lancets USE ONE LANCET TO TEST BLOOD GLUCOSE DAILY FOR DM 250.00 102 each 3  . lovastatin (MEVACOR) 40 MG tablet TAKE 1 TABLET BY MOUTH AT BEDTIME 90 tablet 1  . metFORMIN (GLUCOPHAGE) 500 MG tablet Take 0.5 tablets (250 mg total) by mouth 2 (two) times daily with a meal.    . omeprazole (PRILOSEC) 20 MG capsule Take 20 mg by mouth daily as needed.     . sacubitril-valsartan (ENTRESTO) 97-103 MG Take 1 tablet by mouth 2 (two) times daily. 180 tablet 1  . traMADol (ULTRAM) 50 MG tablet Take 1 tablet (50 mg total) by mouth every 12 (twelve) hours. 60 tablet 1   No facility-administered medications prior to visit.     Per HPI unless specifically indicated in ROS section below Review of Systems Objective:  BP 140/78   Pulse 66   Temp (!) 97.5 F (36.4 C) (Temporal)   Ht 5\' 2"  (  1.575 m)   Wt 173 lb 6 oz (78.6 kg)   SpO2 96%   BMI 31.71 kg/m   Wt Readings from Last 3 Encounters:  02/17/21 173 lb 6 oz (78.6 kg)  12/25/20 172 lb (78 kg)  10/10/20 173 lb 4.5 oz (78.6 kg)      Physical Exam Vitals and nursing note reviewed.  Constitutional:      Appearance: Normal appearance. She is not ill-appearing.  Skin:    General: Skin is warm and dry.     Findings: No rash.     Comments:  Chronic dry skin throughout body from known ichthyosis  No other erythematous rash or postinflammatory changes from prior rashes endorsed Maceration between toes of left foot  Thickened onychomycotic nails to left foot  Neurological:     Mental Status: She is alert.       Results for orders placed or performed in visit on 12/25/20  POCT glycosylated hemoglobin (Hb A1C)  Result  Value Ref Range   Hemoglobin A1C 6.0 (A) 4.0 - 5.6 %   HbA1c POC (<> result, manual entry)     HbA1c, POC (prediabetic range)     HbA1c, POC (controlled diabetic range)     Assessment & Plan:  This visit occurred during the SARS-CoV-2 public health emergency.  Safety protocols were in place, including screening questions prior to the visit, additional usage of staff PPE, and extensive cleaning of exam room while observing appropriate contact time as indicated for disinfecting solutions.   Problem List Items Addressed This Visit    Ichthyosis    Known chronic skin condition.       Skin rash - Primary    No rash appreciated at previous sites. I did ask her to call us next time this happens for sooner appt while in active exacerbation.  She does have evidence of tinea pedis due to skin maceration between toes of foot - discussed food care, recommend lotrimin cream BID x 2-4 wks, update if not improving with this. She agrees with plan.           No orders of the defined types were placed in this encounter.  No orders of the defined types were placed in this encounter.   Patient instructions: I'm not sure what rash on shoulder and chest was caused by.  Try to get in next time it happens for [place] to look at it - call and ask to send message to Dr Para March or Sharen Hones. For rash/skin breakdown between toes - possible fungal infection - treat with clotrimazole (lotrimin) over the counter antifungal cream 1-2x daily for 2-4 weeks.   Follow up plan: Return if symptoms worsen or fail to improve.  Eustaquio Boyden, MD

## 2021-02-17 NOTE — Assessment & Plan Note (Signed)
Known chronic skin condition.

## 2021-02-17 NOTE — Patient Instructions (Signed)
I'm not sure what rash on shoulder and chest was caused by.  Try to get in next time it happens for [place] to look at it - call and ask to send message to Dr Para March or Sharen Hones. For rash/skin breakdown between toes - possible fungal infection - treat with clotrimazole (lotrimin) over the counter antifungal cream 1-2x daily for 2-4 weeks.   Athlete's Foot Athlete's foot (tinea pedis) is a fungal infection of the skin on your feet. It often occurs on the skin that is between or underneath the toes. It can also occur on the soles of your feet. The infection can spread from person to person (is contagious). It can also spread when a person's bare feet come in contact with the fungus on shower floors or on items such as shoes. What are the causes? This condition is caused by a fungus that grows in warm, moist places. You can get athlete's foot by sharing shoes, shower stalls, towels, and wet floors with someone who is infected. Not washing your feet or changing your socks often enough can also lead to athlete's foot. What increases the risk? This condition is more likely to develop in:  Men.  People who have a weak body defense system (immune system).  People who have diabetes.  People who use public showers, such as at a gym.  People who wear heavy-duty shoes, such as Youth worker.  Seasons with warm, humid weather. What are the signs or symptoms? Symptoms of this condition include:  Itchy areas between your toes or on the soles of your feet.  White, flaky, or scaly areas between your toes or on the soles of your feet.  Very itchy small blisters between your toes or on the soles of your feet.  Small cuts in your skin. These cuts can become infected.  Thick or discolored toenails.   How is this diagnosed? This condition may be diagnosed with a physical exam and a review of your medical history. Your health care provider may also take a skin or toenail sample to examine  under a microscope. How is this treated? This condition is treated with antifungal medicines. These may be applied as powders, ointments, or creams. In severe cases, an oral antifungal medicine may be given. Follow these instructions at home: Medicines  Apply or take over-the-counter and prescription medicines only as told by your health care provider.  Apply your antifungal medicine as told by your health care provider. Do not stop using the antifungal even if your condition improves. Foot care  Do not scratch your feet.  Keep your feet dry: ? Wear cotton or wool socks. Change your socks every day or if they become wet. ? Wear shoes that allow air to flow, such as sandals or canvas tennis shoes.  Wash and dry your feet, including the area between your toes. Also, wash and dry your feet: ? Every day or as told by your health care provider. ? After exercising. General instructions  Do not let others use towels, shoes, nail clippers, or other personal items that touch your feet.  Protect your feet by wearing sandals in wet areas, such as locker rooms and shared showers.  Keep all follow-up visits as told by your health care provider. This is important.  If you have diabetes, keep your blood sugar under control. Contact a health care provider if:  You have a fever.  You have swelling, soreness, warmth, or redness in your foot.  Your feet are  not getting better with treatment.  Your symptoms get worse.  You have new symptoms. Summary  Athlete's foot (tinea pedis) is a fungal infection of the skin on your feet. It often occurs on skin that is between or underneath the toes.  This condition is caused by a fungus that grows in warm, moist places.  Symptoms include white, flaky, or scaly areas between your toes or on the soles of your feet.  This condition is treated with antifungal medicines.  Keep your feet clean. Always dry them thoroughly. This information is not  intended to replace advice given to you by your health care provider. Make sure you discuss any questions you have with your health care provider. Document Revised: 07/16/2020 Document Reviewed: 07/16/2020 Elsevier Patient Education  2021 [employer].

## 2021-02-18 ENCOUNTER — Other Ambulatory Visit: Payer: Self-pay | Admitting: Family Medicine

## 2021-02-19 NOTE — Telephone Encounter (Signed)
Refill request for tramadol 50 mg tablets  LOV - 02/17/2021 Next OV - not scheduled Last refilled - 12/23/20 #60/1

## 2021-02-19 NOTE — Telephone Encounter (Signed)
Patient is calling in asking that she get her medication refill asap. She states that she is out. EM

## 2021-02-22 ENCOUNTER — Encounter: Payer: Self-pay | Admitting: Family Medicine

## 2021-02-22 NOTE — Telephone Encounter (Signed)
ERx 

## 2021-03-04 ENCOUNTER — Other Ambulatory Visit: Payer: Self-pay

## 2021-03-04 ENCOUNTER — Ambulatory Visit: Payer: Medicare Other | Admitting: Cardiology

## 2021-03-04 ENCOUNTER — Encounter: Payer: Self-pay | Admitting: Cardiology

## 2021-03-04 VITALS — BP 168/80 | HR 55 | Ht 62.0 in | Wt 176.2 lb

## 2021-03-04 DIAGNOSIS — E782 Mixed hyperlipidemia: Secondary | ICD-10-CM | POA: Diagnosis not present

## 2021-03-04 DIAGNOSIS — I493 Ventricular premature depolarization: Secondary | ICD-10-CM

## 2021-03-04 DIAGNOSIS — Z6830 Body mass index (BMI) 30.0-30.9, adult: Secondary | ICD-10-CM

## 2021-03-04 DIAGNOSIS — I10 Essential (primary) hypertension: Secondary | ICD-10-CM

## 2021-03-04 DIAGNOSIS — I5032 Chronic diastolic (congestive) heart failure: Secondary | ICD-10-CM | POA: Diagnosis not present

## 2021-03-04 DIAGNOSIS — I420 Dilated cardiomyopathy: Secondary | ICD-10-CM

## 2021-03-04 DIAGNOSIS — Z79899 Other long term (current) drug therapy: Secondary | ICD-10-CM | POA: Diagnosis not present

## 2021-03-04 DIAGNOSIS — N183 Chronic kidney disease, stage 3 unspecified: Secondary | ICD-10-CM | POA: Diagnosis not present

## 2021-03-04 NOTE — Progress Notes (Signed)
Primary Care Provider: Tonia Ghent, MD Cardiologist: Glenetta Hew, MD Electrophysiologist: None  Clinic Note: Chief Complaint  Patient presents with  . Follow-up    6 months.  Notes just exercise intolerance-more related to MSK symptoms and dyspnea.   ===================================  ASSESSMENT/PLAN   Problem List Items Addressed This Visit    BMI 30.0-30.9,adult    The patient understands the need to lose weight with diet and exercise. We have discussed specific strategies for this. I think she may benefit from water walking or water aerobics.      On amiodarone therapy    We need to ensure that she has routine LFTs TFT checks.  Holding off on PFTs lesser symptoms.  Continue to monitor ESR and CRP as markers of pulmonary toxicity/inflammation.      Dilated cardiomyopathy ([place]) - Primary (Chronic)    Resolved with aggressive medical management including PVC suppression..      Relevant Orders   EKG 12-Lead (Completed)   Frequent PVCs (Chronic)    Significantly suppressed with amiodarone.  On maintenance dose of 100 mg (one half of the 20 mg tablet) along with carvedilol high-dose.  Treatment has led to significantly improved EF.       Chronic heart failure with preserved ejection fraction (HFpEF) (HCC) (Chronic)    Class I symptoms only besides exertional dyspnea.  I think the exertional dyspnea is related to deconditioning, obesity and arthritis pains.  She is doing okay with exercise, but is fatigued all the time otherwise.  She is on high-dose Entresto and carvedilol for CHF.  Not currently on diuretic, I have asked that she use furosemide on an as-needed basis based on sliding scale.  The majority of the visit was spent reassuring her that she is doing okay.  We discussed her tobacco improvement as well as her symptoms.  I explained that really her exertional fatigue is probably as much related to deconditioning as it is anything else.  Heart seems stable.   I congratulated her on her efforts.  We spent quite a bit of time talking.      HLD (hyperlipidemia) (Chronic)    Due for lab follow-up by PCP. Has been well controlled to date. Remains on lovastatin.      Relevant Orders   EKG 12-Lead (Completed)   Essential hypertension (Chronic)    Blood pressures were high today.  Unusual.  She is on high-dose carvedilol and Entresto.  A little anxious today.  Would watch closely.  She has not been taking her Lasix, may be a little volume up.  I asked that she go back to taking it at least couple times a week.  Recommended home BP monitoring.  May need to consider 24-hour monitoring.  I would hate to have to add a new medication.      Relevant Orders   EKG 12-Lead (Completed)   CKD (chronic kidney disease) stage 3, GFR 30-59 ml/min (HCC) (Chronic)    No cardiac case to medicine cardiac standpoint.  Making Lasix a little more PRN as opposed to standing med.         ===================================  HPI:    Natasha Woodard is a 80 y.o. female with a PMH notable for history of NONISCHEMIC CARDIOMYOPATHY related to FREQUENT PVCS-now resolved with suppression of PVCs (EF improved from 25-30% up to 60 to 65% as of January 21) who presents today for 50-monthfollow-up  CARDIAC PERTINENT HISTORY:  July 2018 diagnosed with dilated cardiomyopathy-EF 25 to  30%;Started on carvedilol, Entresto and spironolactone  ? Also noted to have frequent PVCs-17%. ? Right Left Heart Cath showed no CAD, PCWP of 16 and LVEDP of 26 mmHg. ? ->Referred to Dr. Haroldine Laws (advanced heart failure)  +> PVCs treated with amiodarone ->EF gradually progressed up to 35-40% by June 2019 and then by January 2021 was up to 60-65%. (Amiodarone now maintenance dose 100 mg daily) ? Symptoms are essentially resolved.   Lasix discontinued after initiation of Jardiance.  COVID-19 infection in October(09/26/2019) -quarantined at home, did not require hospitalization after  ER visit on 10/10/2019(noted intractable cough congestion weakness with diarrhea abdominal pain, but went to the ER because of visual hallucinations)->never became hypoxic.->2 more weeks of quarantine. Gradually regaining strength.  Natasha Woodard was last seen on August 21, 2020 via telemedicine.  She did mention that Dr. Haroldine Laws was still working on her to try to lose weight.  She is doing fairly well up the appointment.  Not noticing palpitations.  Exercising 3 days a week at the [facility].  Still upset about not being to complete entire mile without having to stop.  But this is probably because she is having a labral walking with a cane more so than dyspnea.  No chest pain or pressure.  No edema.  No PND orthopnea no palpitations.  Notes that she tires easily.  Was gradually losing weight.  Notes joint pain and unsteady gait walking with a cane.  Dizziness with moving too fast. => No medication changes made.  We simply followed up labs and reassured her that she is doing well.  Recent Hospitalizations: None  Reviewed  CV studies:    The following studies were reviewed today: (if available, images/films reviewed: From Epic Chart or Care Everywhere) . None:  Interval History:   Natasha Woodard returns today for follow-up.  She is doing pretty well.  She is just upset about the fact that she cannot complete her mile walk at the [facility] without a stop.  She is try to work on making it longer, but quite often is limited by her arthritis pains and an unsteady gait. She recently called in to see if she could just when she takes her medications-taking meds at 1:30 PM and 10:30PM.  This is because if she misses her a.m. doses she does better with exercise.  She has be better stamina.  I agreed with this.  She had little frustrated about the hyperkalemia issue that happened earlier this year.  Medications were adjusted.  CV Review of Symptoms (Summary) Cardiovascular ROS: positive for - dyspnea on  exertion and Exercise intolerance, mostly limited by musculoskeletal pains and fatigue.  After exercising, she does not have any energy. negative for - chest pain, edema, irregular heartbeat, orthopnea, palpitations, paroxysmal nocturnal dyspnea, rapid heart rate, shortness of breath or Syncope or near syncope or TIA/amaurosis fugax, claudication  The patient does not have symptoms concerning for COVID-19 infection (fever, chills, cough, or new shortness of breath).   REVIEWED OF SYSTEMS   Review of Systems  Constitutional: Positive for malaise/fatigue (Feels worn out during the day, not really feeling any better after exercise.). Negative for weight loss.  HENT: Negative for congestion.   Respiratory: Negative for cough and shortness of breath.   Cardiovascular: Negative for leg swelling.  Gastrointestinal: Negative for blood in stool and melena.  Genitourinary: Negative for hematuria.  Musculoskeletal: Positive for joint pain (Limits walking).  Neurological: Positive for dizziness, weakness and headaches.  Psychiatric/Behavioral: Positive for memory  loss. Negative for depression. The patient is nervous/anxious. The patient does not have insomnia.    I have reviewed and (if needed) personally updated the patient's problem list, medications, allergies, past medical and surgical history, social and family history.   PAST MEDICAL HISTORY   Past Medical History:  Diagnosis Date  . Calcific tendonitis 2008   Treatment of left leg  . Cardiomyopathy ([place]) 06/2017   a) Echo: EF 25-30%. GR 1-2 DD w/ elevated LVEDP. Mild valvular Dz; b) Cardiac MRI 2/'19: frequent PVCs (diffiuclt to interpret) - EF ~27% w/ diffuse HK.  No evidence of infarct, infiltrative Dz or myocarditis. -- ? if related to PVCs. c) f/u Echo 6/'19: EF 35-40%. Gr 1 DD. Diffuse HK.-> d) Jan 2020 EF 50 to 55%.  Mod MAC, mod LAE.  Ao Sclerosis; e) Echo 1/'21 - EF 60-65%, Gr II DD. Mild Mod MR.   . Chronic combined systolic and  diastolic CHF, NYHA class 2 and ACA/AHA stage C    Now essentially resolved, back to simply diastolic CHF  . Coronary artery disease, non-occlusive    mild-moderate CAD 06/30/17 cath  . CTS (carpal tunnel syndrome)   . Diabetes mellitus    Type II  . Dysrhythmia    patient said that she cant remember what it is  . 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  Nighttime bradycardia suggestive of OSA.  Marland Kitchen Hiatal hernia    with reflux  . Hyperlipidemia    Statin intolerant  . Hypertension   . Ichthyosis congenita   . NSVD (normal spontaneous vaginal delivery) 1971 & 1972  . Obesity   . Plantar fasciitis    Left  . Pulmonary nodule    Imaged multiple times and benign appearing  . Wears glasses     PAST SURGICAL HISTORY   Past Surgical History:  Procedure Laterality Date  . ABDOMINAL HYSTERECTOMY  1975   TAH-BSO  . ABSCESS DRAINAGE  1972   right breast  . BREAST BIOPSY Left 01/14/2009   Stereo- Benign  . CARDIAC MRI  01/2018   Difficult to interpret 2/2 PVCs. Normal LV size - EF ~27% with diffuse HK. Mild RV dilation - normal fxn. -- NO MYOCARDIAL LGI => no definitive evidence of prior MI, Infiltrative Dz or Myocarditis -- suspect NICM, possibly related to PVCs.  . CARPAL TUNNEL RELEASE Right 07/02/2014   Procedure: RIGHT CARPAL TUNNEL RELEASE;  Surgeon: Wynonia Sours, MD;  Location: [facility];  Service: Orthopedics;  Laterality: Right;  . CARPAL TUNNEL RELEASE Left 06/11/2015   Procedure: LEFT CARPAL TUNNEL RELEASE;  Surgeon: Daryll Brod, MD;  Location: [facility];  Service: Orthopedics;  Laterality: Left;  REGIONAL/FAB  . COLONOSCOPY    . corn removal  1968   right  . Holter Monitor  06/2017   ~17,000 PVC beats - majority were singlets, some couplets. 44 brief 3-7 beat runs of NSVT. Also noted were less frequent PACs with 11 runs (longest 15 beats)  . OPEN REDUCTION INTERNAL  FIXATION (ORIF) DISTAL RADIAL FRACTURE Left 07/21/2017   Procedure: OPEN REDUCTION INTERNAL FIXATION (ORIF) LEFT DISTAL RADIAL FRACTURE;  Surgeon: Renette Butters, MD;  Location: [facility];  Service: Orthopedics;  Laterality: Left;  . RIGHT/LEFT HEART CATH AND CORONARY ANGIOGRAPHY N/A 06/30/2017   Procedure: Right/Left Heart Cath and Coronary Angiography;  Surgeon: Leonie Man, MD;  Location: [facility] CV LAB:  pRCA  55%, pCx 40%, OM1 45%, mCx 50%, D2 50% - LVEF 25-35%. Moderately elevated LVEDP(26 mmHg with PCWP 16 mmHg).  FICK CO/CI: 4.47/2.48. PA pressures 47/14 mmHg with a mean of 27 mm.  Marland Kitchen ROTATOR CUFF REPAIR     left shoulder  . TRANSTHORACIC ECHOCARDIOGRAM  06/2017   EF 25 and 30%. GR 1 DD. Mild diastolic dysfunction with elevated LVEDP. Mild valvular disease.  . TRANSTHORACIC ECHOCARDIOGRAM  11'18, 6/'19    a) EF remains 30-35%.  Diffuse hypokinesis noted.  Severe LA dilation; b) Improved EF 35-40%.  Diffuse HK.  GR 1 DD.  Moderate TR.  Mild RV dilation.  . TRANSTHORACIC ECHOCARDIOGRAM  1/'20; 1/'21   a) EF 50 to 55%. Gr I DD. (Notabley improved LV Fxn.  Moderate LA dilation.  Mod MAC.  AoV sclerosis w/o stenosis;; b) EF 60 to 65%.  Normal LV size and function.  GRII DD.  Mild to moderate MR.  Normal PA pressures.  Marland Kitchen ZIO Patch 14 d Event Monitor  10/2018   Results as below <1% PVC.  (Notably improved after starting amiodarone)    Immunization History  Administered Date(s) Administered  . Influenza Whole 08/12/2010  . Influenza,inj,Quad PF,6+ Mos 08/19/2014, 08/15/2015, 09/02/2016, 08/05/2019  . Influenza-Unspecified 09/11/2018  . PFIZER Comirnaty(Gray Top)Covid-19 Tri-Sucrose Vaccine 03/23/2021  . PFIZER(Purple Top)SARS-COV-2 Vaccination 02/09/2020, 02/29/2020, 09/15/2020  . Pneumococcal Conjugate-13 03/10/2015  . Pneumococcal Polysaccharide-23 05/26/2006  . Td 06/14/2007  . Zoster 08/10/2006    MEDICATIONS/ALLERGIES   Current Meds  Medication Sig  . amiodarone (PACERONE)  200 MG tablet Take 0.5 tablets (100 mg total) by mouth daily.  Marland Kitchen aspirin 81 MG tablet Take 1 tablet (81 mg total) by mouth daily.  . carvedilol (COREG) 25 MG tablet TAKE 1 TABLET BY MOUTH TWICE DAILY  . Cholecalciferol (VITAMIN D) 1000 UNITS capsule Take 1,000 Units by mouth daily.  Marland Kitchen glucose blood (ACCU-CHEK AVIVA PLUS) test strip USE TO TEST BLOOD SUGAR ONCE DAILY FOR DM E11.9  . JARDIANCE 10 MG TABS tablet TAKE 1/2 TABLET BY MOUTH DAILY BEFORE BREAKFAST  . Lancets (ACCU-CHEK MULTICLIX) lancets USE ONE LANCET TO TEST BLOOD GLUCOSE DAILY FOR DM 250.00  . lovastatin (MEVACOR) 40 MG tablet TAKE 1 TABLET BY MOUTH AT BEDTIME  . metFORMIN (GLUCOPHAGE) 500 MG tablet Take 0.5 tablets (250 mg total) by mouth 2 (two) times daily with a meal.  . omeprazole (PRILOSEC) 20 MG capsule Take 20 mg by mouth daily as needed.   . sacubitril-valsartan (ENTRESTO) 97-103 MG Take 1 tablet by mouth 2 (two) times daily.  . [DISCONTINUED] furosemide (LASIX) 20 MG tablet Take 1 tablet (20 mg total) by mouth every Monday, Wednesday, and Friday.  . [DISCONTINUED] traMADol (ULTRAM) 50 MG tablet TAKE 1 TABLET(50 MG) BY MOUTH EVERY 12 HOURS    Allergies  Allergen Reactions  . Ezetimibe-Simvastatin Other (See Comments)    REACTION: Muscle aches (side effect)  . Lipitor [Atorvastatin] Other (See Comments)    Leg weakness   . Tamiflu [Oseltamivir Phosphate] Other (See Comments)    nightmares  . Pravastatin Sodium Other (See Comments)    REACTION: Muscle aches (side effect)  . Sulfonamide Derivatives Nausea And Vomiting    SOCIAL HISTORY/FAMILY HISTORY   Reviewed in Epic:  Pertinent findings:  Social History   Tobacco Use  . Smoking status: Former Smoker    Years: 28.00    Types: Cigarettes    Quit date: 12/12/1993    Years since quitting: 27.3  . Smokeless tobacco: Never Used  Vaping Use  . Vaping Use: Never used  Substance Use Topics  . Alcohol use: No    Alcohol/week: 0.0 standard drinks  . Drug use: No    Social History   Social History Narrative   Two kids, two grandchildren, two great grandchildren.    Married 1969   Retired.     OBJCTIVE -PE, EKG, labs   Wt Readings from Last 3 Encounters:  03/19/21 177 lb (80.3 kg)  03/04/21 176 lb 3.2 oz (79.9 kg)  02/17/21 173 lb 6 oz (78.6 kg)    Physical Exam: BP (!) 168/80   Pulse (!) 55   Ht _0  (1.575 m)   Wt 176 lb 3.2 oz (79.9 kg)   SpO2 97%   BMI 32.23 kg/m  Physical Exam Constitutional:      General: She is not in acute distress.    Appearance: Normal appearance. She is obese. She is not ill-appearing or toxic-appearing.  HENT:     Head: Normocephalic and atraumatic.  Neck:     Vascular: No carotid bruit or JVD.  Cardiovascular:     Rate and Rhythm: Regular rhythm. Bradycardia present.  Extrasystoles are present.    Chest Wall: PMI is not displaced.     Pulses: Intact distal pulses. Decreased pulses (Decreased with palpable pedal pulses-difficult to palpate because of her skin condition).     Heart sounds: S1 normal and S2 normal. Heart sounds are distant. No friction rub. No gallop.   Pulmonary:     Effort: Pulmonary effort is normal. No respiratory distress.     Breath sounds: Normal breath sounds. No wheezing, rhonchi or rales.  Chest:     Chest wall: No tenderness.  Musculoskeletal:        General: No swelling (Trivial).     Cervical back: Normal range of motion.  Skin:    General: Skin is warm and dry.     Comments: Chronic thick scaly skin from ichthyosis.  Neurological:     General: No focal deficit present.     Mental Status: She is alert and oriented to person, place, and time.     Gait: Gait abnormal (Walks with a cane, antalgic gait).  Psychiatric:        Mood and Affect: Mood normal.     Comments: Still has poor comprehension.  Does not have a clear grasp on her condition.  Has multiple questions.  Perseverates on minutia     Adult ECG Report  Rate: 54;  Rhythm: sinus bradycardia and RBBB and  LAFB.  Bifascicular block.  (Slight progression of each from last EKG, but has had bifascicular block before);   Narrative Interpretation: Now complete RBBB and LAFB, intermittently has incomplete RBBB and left axis deviation versus RBBB and LAFB.  Recent Labs: Reviewed -no new labs since> October.  Due to get labs checked  ==================================================  COVID-19 Education: The signs and symptoms of COVID-19 were discussed with the patient and how to seek care for testing (follow up with PCP or arrange E-visit).   The importance of social distancing and COVID-19 vaccination was discussed today. The patient is practicing social distancing & Masking.   I spent a total of 54 minutes with the patient spent in direct patient consultation.  Additional time spent with chart review  / charting (studies, outside notes, etc): 12 min Total Time: 66 min   Current medicines are reviewed at length with the patient today.  (+/- concerns) n/a   This visit occurred during  the SARS-CoV-2 public health emergency.  Safety protocols were in place, including screening questions prior to the visit, additional usage of staff PPE, and extensive cleaning of exam room while observing appropriate contact time as indicated for disinfecting solutions.  Notice: This dictation was prepared with Dragon dictation along with smaller phrase technology. Any transcriptional errors that result from this process are unintentional and may not be corrected upon review.  Patient Instructions / Medication Changes & Studies & Tests Ordered   Patient Instructions  Medication Instructions:   No changes  *If you need a refill on your cardiac medications before your next appointment, please call your pharmacy*   Lab Work:  please have Dr Damita Dunnings office order lipid , cmp when you have next visit   If you have labs (blood work) drawn today and your tests are completely normal, you will receive your results  only by: Marland Kitchen MyChart Message (if you have MyChart) OR . A paper copy in the mail If you have any lab test that is abnormal or we need to change your treatment, we will call you to review the results.   Testing/Procedures:  Not needed  Follow-Up: At [facility], you and your health needs are our priority.  As part of our continuing mission to provide you with exceptional heart care, we have created designated Provider Care Teams.  These Care Teams include your primary Cardiologist (physician) and Advanced Practice Providers (APPs -  Physician Assistants and Nurse Practitioners) who all work together to provide you with the care you need, when you need it.     Your next appointment:   6 month(s)  The format for your next appointment:   In Person  Provider:   Glenetta Hew, MD   Other Instructions Check blood pressure at home 2 to 3 times a week  And send  [place] a recording      Studies Ordered:   Orders Placed This Encounter  Procedures  . EKG 12-Lead     Glenetta Hew, M.D., M.S. Interventional Cardiologist   Pager # [phone] Phone # [phone] [address] [place], [place] [address]   Thank you for choosing Heartcare at [facility]!!

## 2021-03-04 NOTE — Patient Instructions (Signed)
Medication Instructions:   No changes  *If you need a refill on your cardiac medications before your next appointment, please call your pharmacy*   Lab Work:  please have Dr Para March office order lipid , cmp when you have next visit   If you have labs (blood work) drawn today and your tests are completely normal, you will receive your results only by: Marland Kitchen MyChart Message (if you have MyChart) OR . A paper copy in the mail If you have any lab test that is abnormal or we need to change your treatment, we will call you to review the results.   Testing/Procedures:  Not needed  Follow-Up: At [facility], you and your health needs are our priority.  As part of our continuing mission to provide you with exceptional heart care, we have created designated Provider Care Teams.  These Care Teams include your primary Cardiologist (physician) and Advanced Practice Providers (APPs -  Physician Assistants and Nurse Practitioners) who all work together to provide you with the care you need, when you need it.     Your next appointment:   6 month(s)  The format for your next appointment:   In Person  Provider:   Bryan Lemma, MD   Other Instructions Check blood pressure at home 2 to 3 times a week  And send  [place] a recording

## 2021-03-05 ENCOUNTER — Encounter: Payer: Self-pay | Admitting: Family Medicine

## 2021-03-07 ENCOUNTER — Other Ambulatory Visit: Payer: Self-pay | Admitting: Family Medicine

## 2021-03-07 DIAGNOSIS — E119 Type 2 diabetes mellitus without complications: Secondary | ICD-10-CM

## 2021-03-19 ENCOUNTER — Encounter: Payer: Self-pay | Admitting: Family Medicine

## 2021-03-19 ENCOUNTER — Ambulatory Visit (INDEPENDENT_AMBULATORY_CARE_PROVIDER_SITE_OTHER): Payer: Medicare Other | Admitting: Family Medicine

## 2021-03-19 ENCOUNTER — Other Ambulatory Visit: Payer: Self-pay

## 2021-03-19 DIAGNOSIS — E119 Type 2 diabetes mellitus without complications: Secondary | ICD-10-CM

## 2021-03-19 DIAGNOSIS — L089 Local infection of the skin and subcutaneous tissue, unspecified: Secondary | ICD-10-CM | POA: Diagnosis not present

## 2021-03-19 LAB — COMPREHENSIVE METABOLIC PANEL
ALT: 9 U/L (ref 0–35)
AST: 12 U/L (ref 0–37)
Albumin: 3.9 g/dL (ref 3.5–5.2)
Alkaline Phosphatase: 56 U/L (ref 39–117)
BUN: 17 mg/dL (ref 6–23)
CO2: 30 mEq/L (ref 19–32)
Calcium: 9.3 mg/dL (ref 8.4–10.5)
Chloride: 105 mEq/L (ref 96–112)
Creatinine, Ser: 1.13 mg/dL (ref 0.40–1.20)
GFR: 45.93 mL/min — ABNORMAL LOW (ref >60.00)
Glucose, Bld: 88 mg/dL (ref 70–99)
Potassium: 4.7 mEq/L (ref 3.5–5.1)
Sodium: 142 mEq/L (ref 135–145)
Total Bilirubin: 0.5 mg/dL (ref 0.2–1.2)
Total Protein: 6.5 g/dL (ref 6.0–8.3)

## 2021-03-19 LAB — CBC WITH DIFFERENTIAL/PLATELET
Basophils Absolute: 0.1 10*3/uL (ref 0.0–0.1)
Basophils Relative: 1.2 % (ref 0.0–3.0)
Eosinophils Absolute: 0.2 10*3/uL (ref 0.0–0.7)
Eosinophils Relative: 4.8 % (ref 0.0–5.0)
HCT: 41.8 % (ref 36.0–46.0)
Hemoglobin: 13.6 g/dL (ref 12.0–15.0)
Lymphocytes Relative: 26.5 % (ref 12.0–46.0)
Lymphs Abs: 1.1 10*3/uL (ref 0.7–4.0)
MCHC: 32.5 g/dL (ref 30.0–36.0)
MCV: 89.5 fl (ref 78.0–100.0)
Monocytes Absolute: 0.4 10*3/uL (ref 0.1–1.0)
Monocytes Relative: 8.8 % (ref 3.0–12.0)
Neutro Abs: 2.5 10*3/uL (ref 1.4–7.7)
Neutrophils Relative %: 58.7 % (ref 43.0–77.0)
Platelets: 333 10*3/uL (ref 150.0–400.0)
RBC: 4.68 Mil/uL (ref 3.87–5.11)
RDW: 15.2 % (ref 11.5–15.5)
WBC: 4.3 10*3/uL (ref 4.0–10.5)

## 2021-03-19 LAB — LIPID PANEL
Cholesterol: 141 mg/dL (ref 0–200)
HDL: 42.1 mg/dL (ref >39.00)
LDL Cholesterol: 81 mg/dL (ref 0–99)
NonHDL: 98.66
Total CHOL/HDL Ratio: 3
Triglycerides: 89 mg/dL (ref 0.0–149.0)
VLDL: 17.8 mg/dL (ref 0.0–40.0)

## 2021-03-19 LAB — HEMOGLOBIN A1C: Hgb A1c MFr Bld: 6.6 % — ABNORMAL HIGH (ref 4.6–6.5)

## 2021-03-19 LAB — TSH: TSH: 1.23 u[IU]/mL (ref 0.35–4.50)

## 2021-03-19 MED ORDER — DOXYCYCLINE HYCLATE 100 MG PO TABS: 100.0000 mg | ORAL_TABLET | Freq: Two times a day (BID) | ORAL | 0 refills | Status: DC

## 2021-03-19 MED ORDER — FUROSEMIDE 20 MG PO TABS
ORAL_TABLET | ORAL | Status: DC
Start: 2021-03-19 — End: 2021-08-30

## 2021-03-19 MED ORDER — TRAMADOL HCL 50 MG PO TABS: ORAL_TABLET | ORAL | 5 refills | Status: DC

## 2021-03-19 MED ORDER — FLUCONAZOLE 150 MG PO TABS: 150.0000 mg | ORAL_TABLET | Freq: Once | ORAL | 0 refills | Status: AC

## 2021-03-19 NOTE — Progress Notes (Signed)
This visit occurred during the SARS-CoV-2 public health emergency.  Safety protocols were in place, including screening questions prior to the visit, additional usage of staff PPE, and extensive cleaning of exam room while observing appropriate contact time as indicated for disinfecting solutions.  Skin lesion, under L arm.  Draining.  Noted in the last few days.  No fevers in the last 3 days, but felt hot the day prior to that.  Sore locally.  No vomiting, no diarrhea.  No other lesions.   Meds, vitals, and allergies reviewed.   ROS: Per HPI unless specifically indicated in ROS section   nad ncat Neck supple, no LA rrr ctab Skin with chronic ichthyotic changes. Left axilla with 3 x 1 cm lesion.  Not currently draining, already opened.  No spreading erythema.  Locally mildly tender

## 2021-03-19 NOTE — Patient Instructions (Addendum)
Go to the lab on the way out.   If you have mychart we'll likely use that to update you.     I would start doxycycline.  Use warm compresses.  Update me if not getting better or if worse.   If you get a yeast infection, start diflucan but skip the amiodarone that day.  Repeat diflucan in 1 week if needed.    Whenever you take diflucan, then skip a dose of amiodarone.  Take care.  Glad to see you.

## 2021-03-21 NOTE — Assessment & Plan Note (Signed)
Already drained, would not need incision and drainage at this point.  Discussed options.  Still okay for outpatient follow-up I would start doxycycline.  Use warm compresses.  Update me if not getting better or if worse.   If she has trouble with a yeast infection, start diflucan but skip the amiodarone that day.  Repeat diflucan in 1 week if needed.    Whenever taking diflucan, then skip a dose of amiodarone. She agrees with plan.

## 2021-03-21 NOTE — Assessment & Plan Note (Signed)
Check labs for upcoming physical.  See notes on labs.

## 2021-03-23 ENCOUNTER — Ambulatory Visit: Payer: Medicare Other

## 2021-03-23 ENCOUNTER — Telehealth: Payer: Self-pay

## 2021-03-23 ENCOUNTER — Ambulatory Visit: Payer: Medicare Other | Attending: Internal Medicine

## 2021-03-23 ENCOUNTER — Other Ambulatory Visit: Payer: Medicare Other

## 2021-03-23 ENCOUNTER — Other Ambulatory Visit: Payer: Self-pay

## 2021-03-23 ENCOUNTER — Other Ambulatory Visit (HOSPITAL_BASED_OUTPATIENT_CLINIC_OR_DEPARTMENT_OTHER): Payer: Self-pay

## 2021-03-23 DIAGNOSIS — Z23 Encounter for immunization: Secondary | ICD-10-CM

## 2021-03-23 NOTE — Telephone Encounter (Signed)
Called patient 3 times after waiting for patient to connect virtually and she never did. Left message notifying patient I had called and to call and reschedule at her convenience. Appointment cancelled.

## 2021-03-23 NOTE — Progress Notes (Signed)
   Covid-19 Vaccination Clinic  Name:  Natasha Woodard    MRN: [id number] DOB: 01-Jan-1941  03/23/2021  [name] was observed post Covid-19 immunization for 15 minutes without incident. She was provided with Vaccine Information Sheet and instruction to access the V-Safe system.   [name] was instructed to call 911 with any severe reactions post vaccine: Marland Kitchen Difficulty breathing  . Swelling of face and throat  . A fast heartbeat  . A bad rash all over body  . Dizziness and weakness   Immunizations Administered    Name Date Dose VIS Date Route   PFIZER Comrnaty(Gray TOP) Covid-19 Vaccine 03/23/2021  2:50 PM 0.3 mL 11/19/2020 Intramuscular   Manufacturer: [employer], [employer]   Lot: [id number]   NDC: [phone]

## 2021-03-24 ENCOUNTER — Ambulatory Visit: Payer: Medicare Other

## 2021-03-25 ENCOUNTER — Telehealth: Payer: Self-pay

## 2021-03-25 ENCOUNTER — Ambulatory Visit: Payer: Medicare Other

## 2021-03-25 ENCOUNTER — Other Ambulatory Visit: Payer: Self-pay

## 2021-03-25 NOTE — Telephone Encounter (Signed)
Called patient 4 times trying to complete her AWV. Phone number rung, keeps going straight to voicemail. Patient did leave a voicemail on my phone earlier this week stating she was having phone issues. Left message notifying patient I called again and she can reschedule or provider might have to complete in office at her upcoming physical since it is difficult to reach her by phone.

## 2021-03-26 ENCOUNTER — Other Ambulatory Visit (HOSPITAL_BASED_OUTPATIENT_CLINIC_OR_DEPARTMENT_OTHER): Payer: Self-pay

## 2021-03-26 MED ORDER — COVID-19 MRNA VACCINE (PFIZER) 30 MCG/0.3ML IM SUSP
INTRAMUSCULAR | 0 refills | Status: DC
  Filled 2021-03-26: qty 0.3, 1d supply, fill #0

## 2021-03-27 ENCOUNTER — Encounter: Payer: Self-pay | Admitting: Cardiology

## 2021-03-27 NOTE — Assessment & Plan Note (Addendum)
Class I symptoms only besides exertional dyspnea.  I think the exertional dyspnea is related to deconditioning, obesity and arthritis pains.  She is doing okay with exercise, but is fatigued all the time otherwise.  She is on high-dose Entresto and carvedilol for CHF.  Not currently on diuretic, I have asked that she use furosemide on an as-needed basis based on sliding scale.  The majority of the visit was spent reassuring her that she is doing okay.  We discussed her tobacco improvement as well as her symptoms.  I explained that really her exertional fatigue is probably as much related to deconditioning as it is anything else.  Heart seems stable.  I congratulated her on her efforts.  We spent quite a bit of time talking.

## 2021-03-27 NOTE — Assessment & Plan Note (Addendum)
Resolved with aggressive medical management including PVC suppression.Marland Kitchen

## 2021-03-27 NOTE — Assessment & Plan Note (Signed)
The patient understands the need to lose weight with diet and exercise. We have discussed specific strategies for this. I think she may benefit from water walking or water aerobics.

## 2021-03-27 NOTE — Assessment & Plan Note (Addendum)
Blood pressures were high today.  Unusual.  She is on high-dose carvedilol and Entresto.  A little anxious today.  Would watch closely.  She has not been taking her Lasix, may be a little volume up.  I asked that she go back to taking it at least couple times a week.  Recommended home BP monitoring.  May need to consider 24-hour monitoring.  I would hate to have to add a new medication.

## 2021-03-27 NOTE — Assessment & Plan Note (Signed)
No cardiac case to medicine cardiac standpoint.  Making Lasix a little more PRN as opposed to standing med.

## 2021-03-27 NOTE — Assessment & Plan Note (Signed)
Due for lab follow-up by PCP. Has been well controlled to date. Remains on lovastatin.

## 2021-03-27 NOTE — Assessment & Plan Note (Signed)
We need to ensure that she has routine LFTs TFT checks.  Holding off on PFTs lesser symptoms.  Continue to monitor ESR and CRP as markers of pulmonary toxicity/inflammation.

## 2021-03-27 NOTE — Assessment & Plan Note (Signed)
Significantly suppressed with amiodarone.  On maintenance dose of 100 mg (one half of the 20 mg tablet) along with carvedilol high-dose.  Treatment has led to significantly improved EF.

## 2021-03-30 NOTE — Progress Notes (Signed)
ADVANCED HF CLINIC NOTE  Referring Physician: Ellyn Hack  Primary Care: Damita Dunnings Primary Cardiologist: Ellyn Hack  HPI:  Natasha Woodard is a 80 y.o. female with a h/o HL, HTN, DM2 and systolic HF due to NICM referred by Dr. Ellyn Hack for further evaluation of her HF.   Initially evaluated in the summer 2018 and was noted to have frequent PVCs on EKG with a low resting heart rate. Holter showed 13% PVCs. Echo showed an EF of 20-30% and a Holter monitor which revealed significant amount of ventricular ectopy. She was also referred for right and left heart catheterization back in July that showed minimal CAD with EF 25-30%, LVEDP 26 mmHg with PCWP of only 16 mHg).  In 11/19 was having more PVCs on ECG so we placed ZioPatch. Results as below < 1% PVC. Echo 1/20 EF 50-55% Says she feels really well. Goes to [employer] 3x/week. Does 3 miles on [place] (30 minutes) and walks 1 mile on the track (30 mins). Breathing much better. Sleeping much better. No orthopnea or PND.    Echo 1/21 EF 60-65%  Echo 1/20 EF 55%  Echo 6/19 EF 35-40%  CMRI: 01/17/18 IMPRESSION: 1.  Difficult study due to frequent PVCs. 2.  Normal LV size with EF 27%, diffuse hypokinesis 3.  Mildly dilated RV with normal systolic function 4. No myocardial LGE, so no definitive evidence for prior MI, infiltrative disease, or myocarditis. Suspect nonischemic cardiomyopathy, possibly due to PVCs.  Event monitor 01/20/18 Normal sinus rhythm with frequent multifocal PVCs (9%) and PACs. Nighttime bradycardia suggestive of OSA.   ZioPatch 11/19  1. Predominant underlying rhythm was Sinus Rhythm. Min HR of 46 bpm, max HR of 143 bpm, and avg HR of 62 bpm. 2. Two brief runs NSVT the longest lasting 4 beats with an avg rate of 110 bpm. 3. Occasional PVCs (< 1.0%)     Past Medical History:  Diagnosis Date  . Calcific tendonitis 2008   Treatment of left leg  . Cardiomyopathy ([place]) 06/2017   a) Echo: EF 25-30%. GR 1-2 DD w/ elevated LVEDP.  Mild valvular Dz; b) Cardiac MRI 2/'19: frequent PVCs (diffiuclt to interpret) - EF ~27% w/ diffuse HK.  No evidence of infarct, infiltrative Dz or myocarditis. -- ? if related to PVCs. c) f/u Echo 6/'19: EF 35-40%. Gr 1 DD. Diffuse HK.-> d) Jan 2020 EF 50 to 55%.  Mod MAC, mod LAE.  Ao Sclerosis; e) Echo 1/'21 - EF 60-65%, Gr II DD. Mild Mod MR.   . Chronic combined systolic and diastolic CHF, NYHA class 2 and ACA/AHA stage C    Now essentially resolved, back to simply diastolic CHF  . Coronary artery disease, non-occlusive    mild-moderate CAD 06/30/17 cath  . CTS (carpal tunnel syndrome)   . Diabetes mellitus    Type II  . Dysrhythmia    patient said that she cant remember what it is  . 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  Nighttime bradycardia suggestive of OSA.  Marland Kitchen Hiatal hernia    with reflux  . Hyperlipidemia    Statin intolerant  . Hypertension   . Ichthyosis congenita   . NSVD (normal spontaneous vaginal delivery) 1971 & 1972  . Obesity   . Plantar fasciitis    Left  . Pulmonary nodule    Imaged multiple times and benign appearing  . Wears glasses     Current Outpatient Medications  Medication Sig Dispense Refill  . amiodarone (PACERONE) 200 MG tablet Take 0.5 tablets (100 mg total) by mouth daily. 45 tablet 1  . aspirin 81 MG tablet Take 1 tablet (81 mg total) by mouth daily.    . carvedilol (COREG) 25 MG tablet TAKE 1 TABLET BY MOUTH TWICE DAILY 180 tablet 3  . Cholecalciferol (VITAMIN D) 1000 UNITS capsule Take 1,000 Units by mouth daily.    . furosemide (LASIX) 20 MG tablet 1 tab on Tuesday, Thursday, Saturday    . glucose blood (ACCU-CHEK AVIVA PLUS) test strip USE TO TEST BLOOD SUGAR ONCE DAILY FOR DM E11.9 100 each 1  . JARDIANCE 10 MG TABS tablet TAKE 1/2 TABLET BY MOUTH DAILY BEFORE BREAKFAST 15 tablet 11  . Lancets (ACCU-CHEK MULTICLIX) lancets USE ONE LANCET TO TEST BLOOD GLUCOSE  DAILY FOR DM 250.00 102 each 3  . lovastatin (MEVACOR) 40 MG tablet TAKE 1 TABLET BY MOUTH AT BEDTIME 90 tablet 1  . metFORMIN (GLUCOPHAGE) 500 MG tablet Take 0.5 tablets (250 mg total) by mouth 2 (two) times daily with a meal.    . omeprazole (PRILOSEC) 20 MG capsule Take 20 mg by mouth daily as needed.     . sacubitril-valsartan (ENTRESTO) 97-103 MG Take 1 tablet by mouth 2 (two) times daily. 180 tablet 1  . traMADol (ULTRAM) 50 MG tablet TAKE 1 TABLET(50 MG) BY MOUTH EVERY 12 HOURS 60 tablet 5  . COVID-19 mRNA vaccine, Pfizer, 30 MCG/0.3ML injection Inject into the muscle. 0.3 mL 0   No current facility-administered medications for this encounter.    Allergies  Allergen Reactions  . Ezetimibe-Simvastatin Other (See Comments)    REACTION: Muscle aches (side effect)  . Lipitor [Atorvastatin] Other (See Comments)    Leg weakness   . Tamiflu [Oseltamivir Phosphate] Other (See Comments)    nightmares  . Pravastatin Sodium Other (See Comments)    REACTION: Muscle aches (side effect)  . Sulfonamide Derivatives Nausea And Vomiting      Social History   Socioeconomic History  . Marital status: Married    Spouse name: Not on file  . Number of children: 2  . Years of education: Not on file  . Highest education level: Not on file  Occupational History    Employer: RETIRED  Tobacco Use  . Smoking status: Former Smoker    Years: 28.00    Types: Cigarettes    Quit date: 12/12/1993    Years since quitting: 27.3  . Smokeless tobacco: Never Used  Vaping Use  . Vaping Use: Never used  Substance and Sexual Activity  . Alcohol use: No    Alcohol/week: 0.0 standard drinks  . Drug use: No  . Sexual activity: Never  Other Topics Concern  . Not on file  Social History Narrative   Two kids, two grandchildren, two great grandchildren.    Married 1969   Retired.    Social Determinants of Health   Financial Resource Strain: Not on file  Food Insecurity: Not on file  Transportation  Needs: Not on file  Physical Activity: Not on file  Stress: Not on file  Social Connections: Not on file  Intimate Partner Violence: Not on file      Family History  Problem Relation Age of Onset  . Heart disease Mother        s/p pacemaker  . Diabetes Mother   . Heart disease Father   . Diabetes Father   . Heart disease Brother  MI  . Cancer Brother        Lung  . Hypertension Other   . Colon cancer Neg Hx   . Breast cancer Neg Hx     Vitals:   03/31/21 1529  BP: 130/80  Pulse: 65  SpO2: 95%  Weight: 79.8 kg (176 lb)   Wt Readings from Last 3 Encounters:  03/31/21 79.8 kg (176 lb)  03/19/21 80.3 kg (177 lb)  03/04/21 79.9 kg (176 lb 3.2 oz)    PHYSICAL EXAM: General:  Elderly. No resp difficulty HEENT: normal Neck: supple. no JVD. Carotids 2+ bilat; no bruits. No lymphadenopathy or thryomegaly appreciated. Cor: PMI nondisplaced. Regular rate & rhythm. No rubs, gallops or murmurs. Lungs: clear Abdomen: obese soft, nontender, nondistended. No hepatosplenomegaly. No bruits or masses. Good bowel sounds. Extremities: no cyanosis, clubbing, rash, edema Neuro: alert & orientedx3, cranial nerves grossly intact. moves all 4 extremities w/o difficulty. Affect pleasant   ASSESSMENT & PLAN:  1. Chronic systolic HF - due to NICM. Cath 7/18 with minimal CAD. Initial EF 20-25%. Etiology ? Frequent PVCs - Echo 11/18 EF 30-35%. - cMRI EF 27% No LGE. 2/19 - Echo 6/19 EF ~35-40%  - Echo 1/20  EF 50-55% - complete recovery of LV function with PVC suppression  - Echo 1/21 EF 60-65%  - Doing great NYHA I-II - Volume status looks good.  - Holter monitor 2/19 9% PVCs  (Previous holter monitor 7/18 with 13% PVCs) - ZioPatch 11/19 <1% PVC - Continue entresto 97/103 - Continue carvedilol 18.75 bid - Continue spiro 12.5 daily. Potassium runs about 4.7-5.0 will not titrate.  - Continue Jardiance  2. Frequent PVCs - Initial monitor with 13% PVCs 7/18. ? Etiology of NICM -  F/u holter monitor 2/19 9% PVCs  --ZioPatch 11/19 <1% PVC - Continue amio 100 daily. Recent labs look great  - Had sleep study in November with Dr. Annamaria Boots. Very mild OSA (5.9/hr) told she doesn't need CPAP  3. HTN - Blood pressure well controlled. Continue current regimen.n.  4. DM2 - Continue metformin and Jardiance  She is doing great. EF normalized with PVC suppression. Continue low-dose amio. Can graduate HF Clinic and f/u with Dr. Ellyn Hack.   Glori Bickers, MD  4:01 PM

## 2021-03-31 ENCOUNTER — Other Ambulatory Visit: Payer: Self-pay

## 2021-03-31 ENCOUNTER — Encounter (HOSPITAL_COMMUNITY): Payer: Self-pay | Admitting: Internal Medicine

## 2021-03-31 ENCOUNTER — Ambulatory Visit (HOSPITAL_COMMUNITY)
Admission: RE | Admit: 2021-03-31 | Discharge: 2021-03-31 | Disposition: A | Discharge status: Home / Self Care | Payer: Medicare Other | Source: Ambulatory Visit | Attending: Internal Medicine | Admitting: Internal Medicine

## 2021-03-31 VITALS — BP 130/80 | HR 65 | Wt 176.0 lb

## 2021-03-31 DIAGNOSIS — G4733 Obstructive sleep apnea (adult) (pediatric): Secondary | ICD-10-CM | POA: Diagnosis not present

## 2021-03-31 DIAGNOSIS — E119 Type 2 diabetes mellitus without complications: Secondary | ICD-10-CM | POA: Diagnosis not present

## 2021-03-31 DIAGNOSIS — I5022 Chronic systolic (congestive) heart failure: Secondary | ICD-10-CM | POA: Diagnosis not present

## 2021-03-31 DIAGNOSIS — Z8249 Family history of ischemic heart disease and other diseases of the circulatory system: Secondary | ICD-10-CM | POA: Diagnosis not present

## 2021-03-31 DIAGNOSIS — I110 Hypertensive heart disease with heart failure: Secondary | ICD-10-CM | POA: Diagnosis not present

## 2021-03-31 DIAGNOSIS — Z7984 Long term (current) use of oral hypoglycemic drugs: Secondary | ICD-10-CM | POA: Diagnosis not present

## 2021-03-31 DIAGNOSIS — I2510 Atherosclerotic heart disease of native coronary artery without angina pectoris: Secondary | ICD-10-CM | POA: Insufficient documentation

## 2021-03-31 DIAGNOSIS — I493 Ventricular premature depolarization: Secondary | ICD-10-CM

## 2021-03-31 DIAGNOSIS — Z7982 Long term (current) use of aspirin: Secondary | ICD-10-CM | POA: Insufficient documentation

## 2021-03-31 DIAGNOSIS — I428 Other cardiomyopathies: Secondary | ICD-10-CM | POA: Diagnosis not present

## 2021-03-31 DIAGNOSIS — I10 Essential (primary) hypertension: Secondary | ICD-10-CM | POA: Diagnosis not present

## 2021-03-31 DIAGNOSIS — Z79899 Other long term (current) drug therapy: Secondary | ICD-10-CM | POA: Insufficient documentation

## 2021-03-31 DIAGNOSIS — Z87891 Personal history of nicotine dependence: Secondary | ICD-10-CM | POA: Diagnosis not present

## 2021-03-31 NOTE — Addendum Note (Signed)
Encounter addended by: Suezanne Cheshire, RN on: 03/31/2021 4:08 PM  Actions taken: Clinical Note Signed

## 2021-03-31 NOTE — Patient Instructions (Signed)
Congratulations on graduating from  the clinic   Call us as needed  If you have any questions or concerns before your next appointment please send [place] a message through [place] or call our office at [phone].    TO LEAVE A MESSAGE FOR THE NURSE SELECT OPTION 2, PLEASE LEAVE A MESSAGE INCLUDING: . YOUR NAME . DATE OF BIRTH . CALL BACK NUMBER . REASON FOR CALL**this is important as we prioritize the call backs  YOU WILL RECEIVE A CALL BACK THE SAME DAY AS LONG AS YOU CALL BEFORE 4:00 PM  At the [facility], you and your health needs are our priority. As part of our continuing mission to provide you with exceptional heart care, we have created designated Provider Care Teams. These Care Teams include your primary Cardiologist (physician) and Advanced Practice Providers (APPs- Physician Assistants and Nurse Practitioners) who all work together to provide you with the care you need, when you need it.   You may see any of the following providers on your designated Care Team at your next follow up: Marland Kitchen Dr Arvilla Meres . Dr Marca Ancona . Dr Thornell Mule . Tonye Becket, NP . Robbie Lis, PA . Shanda Bumps [place],NP . Karle Plumber, PharmD   Please be sure to bring in all your medications bottles to every appointment.

## 2021-04-06 ENCOUNTER — Other Ambulatory Visit: Payer: Self-pay

## 2021-04-06 ENCOUNTER — Ambulatory Visit (INDEPENDENT_AMBULATORY_CARE_PROVIDER_SITE_OTHER): Payer: Medicare Other | Admitting: Family Medicine

## 2021-04-06 ENCOUNTER — Encounter: Payer: Self-pay | Admitting: Family Medicine

## 2021-04-06 VITALS — BP 138/76 | HR 64 | Temp 95.6°F | Ht 62.0 in | Wt 174.9 lb

## 2021-04-06 DIAGNOSIS — E119 Type 2 diabetes mellitus without complications: Secondary | ICD-10-CM | POA: Diagnosis not present

## 2021-04-06 DIAGNOSIS — I10 Essential (primary) hypertension: Secondary | ICD-10-CM

## 2021-04-06 DIAGNOSIS — M2550 Pain in unspecified joint: Secondary | ICD-10-CM

## 2021-04-06 DIAGNOSIS — E782 Mixed hyperlipidemia: Secondary | ICD-10-CM

## 2021-04-06 DIAGNOSIS — Z0000 Encounter for general adult medical examination without abnormal findings: Secondary | ICD-10-CM

## 2021-04-06 DIAGNOSIS — Z7189 Other specified counseling: Secondary | ICD-10-CM

## 2021-04-06 NOTE — Patient Instructions (Signed)
Take care.  Glad to see you. Update me as needed.  Recheck in about 4 months.  We can do A1c at the visit.

## 2021-04-06 NOTE — Progress Notes (Signed)
This visit occurred during the SARS-CoV-2 public health emergency.  Safety protocols were in place, including screening questions prior to the visit, additional usage of staff PPE, and extensive cleaning of exam room while observing appropriate contact time as indicated for disinfecting solutions.  I have personally reviewed the Medicare Annual Wellness questionnaire and have noted 1. The patient's medical and social history 2. Their use of alcohol, tobacco or illicit drugs 3. Their current medications and supplements 4. The patient's functional ability including ADL's, fall risks, home safety risks and hearing or visual             impairment. 5. Diet and physical activities 6. Evidence for depression or mood disorders  The patients weight, height, BMI have been recorded in the chart and visual acuity is per eye clinic.  I have made referrals, counseling and provided education to the patient based review of the above and I have provided the pt with a written personalized care plan for preventive services.  Provider list updated- see scanned forms.  Routine anticipatory guidance given to patient.  See health maintenance. The possibility exists that previously documented standard health maintenance information may have been brought forward from a previous encounter into this note.  If needed, that same information has been updated to reflect the current situation based on today's encounter.    Flu 2021 Shingles previously done per patient report. PNA up-to-date Tetanus discussed with patient, may be cheaper at the pharmacy. COVID-vaccine up-to-date. Colon cancer screening not due given her age. Breast cancer screening done 2021 per patient report Advance directive-husband designated if patient were incapacitated. Cognitive function addressed- see scanned forms- and if abnormal then additional documentation follows.   L axillary lesion is better, done with abx.  Sx resolved.    Diabetes:   Using medications without difficulties: yes Hypoglycemic episodes: down to 80s Hyperglycemic episodes:no Feet problems: no Blood Sugars averaging: ~90-100 eye exam within last year: yes  Hypertension:    Using medication without problems or lightheadedness: yes Chest pain with exertion:no Edema:no Short of breath: noted with stairs but still walking at the [facility]- can walk 1/3 mile, that is stable.    Taking tylenol and tramadol at baseline for joint pain.  Tylenol helps on the days she is walking.    Elevated Cholesterol: Using medications without problems: yes Muscle aches: not from statin  Diet compliance: yes Exercise: yes  PMH and SH reviewed  Meds, vitals, and allergies reviewed.   ROS: Per HPI.  Unless specifically indicated otherwise in HPI, the patient denies:  General: fever. Eyes: acute vision changes ENT: sore throat Cardiovascular: chest pain Respiratory: SOB GI: vomiting GU: dysuria Musculoskeletal: acute back pain Derm: acute rash Neuro: acute motor dysfunction Psych: worsening mood Endocrine: polydipsia Heme: bleeding Allergy: hayfever  GEN: nad, alert and oriented HEENT: ncat NECK: supple w/o LA CV: rrr. PULM: ctab, no inc wob ABD: soft, +bs EXT: no edema SKIN: no acute rash, chronic changes at baseline.  Diabetic foot exam: Normal inspection No skin breakdown No calluses  Normal DP pulses Normal sensation to light touch and monofilament Nails normal except for absent R1st nail.

## 2021-04-08 NOTE — Assessment & Plan Note (Signed)
Would continue tramadol and Tylenol.  No adverse effect on medication.

## 2021-04-08 NOTE — Assessment & Plan Note (Signed)
Advance directive- husband designated if patient were incapacitated.  

## 2021-04-08 NOTE — Assessment & Plan Note (Signed)
Tolerating lovastatin.  It does not appear that she has aches related to statin use.  Continue work on diet and exercise.  Labs discussed with patient.

## 2021-04-08 NOTE — Assessment & Plan Note (Signed)
Continue amiodarone Lasix Entresto and carvedilol.  Labs discussed with patient.  She can walk about a third of a mile and that is stable.  Continue work on diet and exercise.

## 2021-04-08 NOTE — Assessment & Plan Note (Signed)
A1c still reasonable.  Would continue Jardiance and metformin.  Continue work on diet and exercise.  Recheck periodically.

## 2021-04-08 NOTE — Assessment & Plan Note (Signed)
Flu 2021 Shingles previously done per patient report. PNA up-to-date Tetanus discussed with patient, may be cheaper at the pharmacy. COVID-vaccine up-to-date. Colon cancer screening not due given her age. Breast cancer screening done 2021 per patient report Advance directive-husband designated if patient were incapacitated. Cognitive function addressed- see scanned forms- and if abnormal then additional documentation follows.

## 2021-04-27 ENCOUNTER — Encounter: Payer: Self-pay | Admitting: Emergency Medicine

## 2021-04-27 ENCOUNTER — Ambulatory Visit (INDEPENDENT_AMBULATORY_CARE_PROVIDER_SITE_OTHER): Payer: Medicare Other

## 2021-04-27 ENCOUNTER — Other Ambulatory Visit: Payer: Medicare Other

## 2021-04-27 ENCOUNTER — Ambulatory Visit
Admission: EM | Admit: 2021-04-27 | Discharge: 2021-04-27 | Disposition: A | Discharge status: Home / Self Care | Payer: Medicare Other | Attending: Student | Admitting: Student

## 2021-04-27 ENCOUNTER — Telehealth: Payer: Self-pay

## 2021-04-27 ENCOUNTER — Other Ambulatory Visit: Payer: Self-pay

## 2021-04-27 DIAGNOSIS — R0602 Shortness of breath: Secondary | ICD-10-CM

## 2021-04-27 DIAGNOSIS — Z1152 Encounter for screening for COVID-19: Secondary | ICD-10-CM

## 2021-04-27 DIAGNOSIS — I10 Essential (primary) hypertension: Secondary | ICD-10-CM | POA: Diagnosis not present

## 2021-04-27 DIAGNOSIS — R059 Cough, unspecified: Secondary | ICD-10-CM

## 2021-04-27 DIAGNOSIS — J301 Allergic rhinitis due to pollen: Secondary | ICD-10-CM | POA: Diagnosis not present

## 2021-04-27 DIAGNOSIS — I509 Heart failure, unspecified: Secondary | ICD-10-CM

## 2021-04-27 NOTE — Telephone Encounter (Signed)
I spoke with Natasha Woodard; no fever, Natasha Woodard feels fine but has prod cough with yellow phlegm with wheezing but no SOB  Natasha Woodard said she has a lot of congestion in her chest. Due to Natasha Woodard having CHF; Natasha Woodard will go to [facility] on [address] this morning to be eval and possible testing such as CXR and possible covid test.  Natasha Woodard wanted to keep appt with Dr Para March on 04/29/21 as UC FU.  The 04/19/21 visit is in office and DR Para March said OK to keep appt and will see results of covid test if done today at [facility] or home test. Sending note to Dr Para March and Wendie Simmer CMA.

## 2021-04-27 NOTE — Telephone Encounter (Signed)
Noted. Thanks.

## 2021-04-27 NOTE — Telephone Encounter (Signed)
Patient did go to UC as advised; will wait for finished discharge note.

## 2021-04-27 NOTE — Discharge Instructions (Addendum)
-  I suspect that your congestion and cough is due to allergies. Let's try an over-the-counter allergy medication like Zyrtec for the next few days, until your follow-up with your PCP.  -We're screening you for covid today to make sure that isn't contributing to your symptoms.  -Your chest x-ray looks good today.  There is no fluid on the lungs. -Your EKG does not show any changes in your heart function. -For heart failure, continue Lasix. -For hypertension, continue carvedilol, Entresto. -Follow-up with your PCP as scheduled on 5/19. -If you develop worsening shortness of breath, or new symptoms like dizziness, chest pain, weakness in your arms or legs, worse headache of life, confusion-call 911.

## 2021-04-27 NOTE — Telephone Encounter (Signed)
[facility] Day - Client TELEPHONE ADVICE RECORD [facility] Patient Name: Natasha Woodard Gender: Female DOB: [birth year] Age: 80 Y 8 M 2 D Return Phone Number: [phone] (Primary), [phone] (Secondary) Address: City/ State/ Zip: [place] [address] Client [facility] Day - Client Client Site [facility] - Day Physician Raechel Ache - MD Contact Type Call Who Is Calling Patient / Member / Family / Caregiver Call Type Triage / Clinical Relationship To Patient Self Return Phone Number [phone] (Primary) Chief Complaint CHEST PAIN - pain, pressure, heaviness or tightness Reason for Call Symptomatic / Request for Health Information Initial Comment Caller states she has a cough, congestion and rattling in her chest for 3 weeks. Has an appointment Thursday at 1200 PM. Translation No Nurse Assessment Nurse: Elesa Hacker, RN, Nash Dimmer Date/Time Lamount Cohen Time): 04/27/2021 8:55:50 AM Confirm and document reason for call. If symptomatic, describe symptoms. ---Caller states she has a cough, congestion and rattling in her chest for 3 weeks. Has an appointment Thursday at 1200 PM. Has not been seen. No temp. Does the patient have any new or worsening symptoms? ---Yes Will a triage be completed? ---Yes Related visit to physician within the last 2 weeks? ---No Does the PT have any chronic conditions? (i.e. diabetes, asthma, this includes High risk factors for pregnancy, etc.) ---Yes List chronic conditions. ---heart failure HTN diabetes high cholesterol Is this a behavioral health or substance abuse call? ---No Guidelines Guideline Title Affirmed Question Affirmed Notes Nurse Date/Time (Eastern Time) COVID-19 - Diagnosed or Suspected Chest pain or pressure Deaton, RN, Nash Dimmer 04/27/2021 8:57:04 AM Disp. Time Lamount Cohen Time) Disposition Final User 04/27/2021 8:54:42 AM Send to Urgent Blima Singer,  Tiffany 04/27/2021 9:04:14 AM Go to ED Now (or PCP triage) Yes Deaton, RN, Loraine Leriche NOTE: All timestamps contained within this report are represented as [place] Standard Time. CONFIDENTIALTY NOTICE: This fax transmission is intended only for the addressee. It contains information that is legally privileged, confidential or otherwise protected from use or disclosure. If you are not the intended recipient, you are strictly prohibited from reviewing, disclosing, copying using or disseminating any of this information or taking any action in reliance on or regarding this information. If you have received this fax in error, please notify us immediately by telephone so that we can arrange for its return to [place]. Phone: [phone], Toll-Free: [phone], Fax: [phone] Page: 2 of 2 Call Id: [id number] Caller Disagree/Comply Disagree Caller Understands No PreDisposition Did not know what to do Care Advice Given Per Guideline GO TO ED NOW (OR PCP TRIAGE): GENERAL CARE ADVICE FOR COVID-19 SYMPTOMS: * The symptoms are generally treated the same whether you have COVID-19, influenza or some other respiratory virus. * Cough: Use cough drops. * Feeling dehydrated: Drink extra liquids. If the air in your home is dry, use a humidifier. CARE ADVICE given per COVID-19 - DIAGNOSED OR SUSPECTED (Adult) guideline. Comments User: Wandra Scot, RN Date/Time Lamount Cohen Time): 04/27/2021 9:03:27 AM Caller has not taken a covid test. User: Wandra Scot, RN Date/Time Lamount Cohen Time): 04/27/2021 9:06:10 AM Melaine at the office was made aware of the refusal. Referrals GO TO FACILITY REFUSED

## 2021-04-27 NOTE — ED Provider Notes (Signed)
[facility]    CSN: [id number] Arrival date & time: 04/27/21  1051      History   Chief Complaint Chief Complaint  Patient presents with  . Cough    HPI Natasha Woodard is a 80 y.o. female presenting with cough, chest congestion.  Medical history cardiomyopathy, diabetes, hypertension, pulmonary nodule, CHF, CAD, hiatial hernia.  Patient presents today with 3 weeks of cough, worse at night, productive with green phlegm with chest congestion. Shortness of breath with exertion. Scant nasal congestion. Patient states that her doctor sent her here for a chest x-ray and to rule out CHF or pneumonia, also requesting Covid test. Denies shortness of breath with lying down, denies exacerbation of cough with lying down. Denies pedal edema, swelling in hands, weight gain. Has been taking her lasix as directed, as well as antihypertensives. Adamantly denies chest pain, shortness of breath,  dizziness, weakness. Denies sick contacts. Adamantly denies allergic rhinitis component.   HPI  Past Medical History:  Diagnosis Date  . Calcific tendonitis 2008   Treatment of left leg  . Cardiomyopathy ([place]) 06/2017   a) Echo: EF 25-30%. GR 1-2 DD w/ elevated LVEDP. Mild valvular Dz; b) Cardiac MRI 2/'19: frequent PVCs (diffiuclt to interpret) - EF ~27% w/ diffuse HK.  No evidence of infarct, infiltrative Dz or myocarditis. -- ? if related to PVCs. c) f/u Echo 6/'19: EF 35-40%. Gr 1 DD. Diffuse HK.-> d) Jan 2020 EF 50 to 55%.  Mod MAC, mod LAE.  Ao Sclerosis; e) Echo 1/'21 - EF 60-65%, Gr II DD. Mild Mod MR.   . Chronic combined systolic and diastolic CHF, NYHA class 2 and ACA/AHA stage C    Now essentially resolved, back to simply diastolic CHF  . Coronary artery disease, non-occlusive    mild-moderate CAD 06/30/17 cath  . CTS (carpal tunnel syndrome)   . Diabetes mellitus    Type II  . Dysrhythmia    patient said that she cant remember what it is  . Frequent unifocal PVCs 01/2018   Event  monitor showed normal sinus rhythm with frequent multifocal PVCs (9%) and PACs.  Nighttime bradycardia suggestive of OSA.  Marland Kitchen Hiatal hernia    with reflux  . Hyperlipidemia    Statin intolerant  . Hypertension   . Ichthyosis congenita   . NSVD (normal spontaneous vaginal delivery) 1971 & 1972  . Obesity   . Plantar fasciitis    Left  . Pulmonary nodule    Imaged multiple times and benign appearing  . Wears glasses     Patient Active Problem List   Diagnosis Date Noted  . On amiodarone therapy 08/21/2020  . Hyperkalemia 08/21/2020  . CKD (chronic kidney disease) stage 3, GFR 30-59 ml/min ([place]) 09/25/2019  . Gastroesophageal reflux disease 06/19/2019  . Abdominal pain 02/06/2019  . Health care maintenance 11/14/2018  . Joint pain 08/07/2018  . Sensorineural hearing loss (SNHL), bilateral 03/20/2018  . Complex sleep apnea syndrome 10/05/2017  . Chronic heart failure with preserved ejection fraction (HFpEF) ([place]) 09/08/2017  . Dilated cardiomyopathy ([place]) 06/30/2017  . Frequent PVCs 06/30/2017  . Advance care planning 08/19/2014  . Medicare annual wellness visit, subsequent 07/30/2013  . KNEE PAIN, LEFT 11/09/2010  . Diabetes mellitus without complication ([place]) 96/75/9163  . HLD (hyperlipidemia) 05/19/2010  . Essential hypertension 05/19/2010  . EMPHYSEMA 05/19/2010  . HIATAL HERNIA WITH REFLUX 05/19/2010  . Ichthyosis 05/19/2010    Past Surgical History:  Procedure Laterality Date  . ABDOMINAL HYSTERECTOMY  1975   TAH-BSO  . ABSCESS DRAINAGE  1972   right breast  . BREAST BIOPSY Left 01/14/2009   Stereo- Benign  . CARDIAC MRI  01/2018   Difficult to interpret 2/2 PVCs. Normal LV size - EF ~27% with diffuse HK. Mild RV dilation - normal fxn. -- NO MYOCARDIAL LGI => no definitive evidence of prior MI, Infiltrative Dz or Myocarditis -- suspect NICM, possibly related to PVCs.  . CARPAL TUNNEL RELEASE Right 07/02/2014   Procedure: RIGHT CARPAL TUNNEL RELEASE;  Surgeon: Wynonia Sours, MD;  Location: [facility];  Service: Orthopedics;  Laterality: Right;  . CARPAL TUNNEL RELEASE Left 06/11/2015   Procedure: LEFT CARPAL TUNNEL RELEASE;  Surgeon: Daryll Brod, MD;  Location: [facility];  Service: Orthopedics;  Laterality: Left;  REGIONAL/FAB  . COLONOSCOPY    . corn removal  1968   right  . Holter Monitor  06/2017   ~17,000 PVC beats - majority were singlets, some couplets. 44 brief 3-7 beat runs of NSVT. Also noted were less frequent PACs with 11 runs (longest 15 beats)  . OPEN REDUCTION INTERNAL FIXATION (ORIF) DISTAL RADIAL FRACTURE Left 07/21/2017   Procedure: OPEN REDUCTION INTERNAL FIXATION (ORIF) LEFT DISTAL RADIAL FRACTURE;  Surgeon: Renette Butters, MD;  Location: [employer] [facility];  Service: Orthopedics;  Laterality: Left;  . RIGHT/LEFT HEART CATH AND CORONARY ANGIOGRAPHY N/A 06/30/2017   Procedure: Right/Left Heart Cath and Coronary Angiography;  Surgeon: Leonie Man, MD;  Location: [facility] INVASIVE CV LAB:  pRCA 55%, pCx 40%, OM1 45%, mCx 50%, D2 50% - LVEF 25-35%. Moderately elevated LVEDP(26 mmHg with PCWP 16 mmHg).  FICK CO/CI: 4.47/2.48. PA pressures 47/14 mmHg with a mean of 27 mm.  Marland Kitchen ROTATOR CUFF REPAIR     left shoulder  . TRANSTHORACIC ECHOCARDIOGRAM  06/2017   EF 25 and 30%. GR 1 DD. Mild diastolic dysfunction with elevated LVEDP. Mild valvular disease.  . TRANSTHORACIC ECHOCARDIOGRAM  11'18, 6/'19    a) EF remains 30-35%.  Diffuse hypokinesis noted.  Severe LA dilation; b) Improved EF 35-40%.  Diffuse HK.  GR 1 DD.  Moderate TR.  Mild RV dilation.  . TRANSTHORACIC ECHOCARDIOGRAM  1/'20; 1/'21   a) EF 50 to 55%. Gr I DD. (Notabley improved LV Fxn.  Moderate LA dilation.  Mod MAC.  AoV sclerosis w/o stenosis;; b) EF 60 to 65%.  Normal LV size and function.  GRII DD.  Mild to moderate MR.  Normal PA pressures.  Marland Kitchen ZIO Patch 14 d Event Monitor  10/2018   Results as below <1% PVC.  (Notably improved after starting amiodarone)    OB  History   No obstetric history on file.      Home Medications    Prior to Admission medications   Medication Sig Start Date End Date Taking? Authorizing Provider  amiodarone (PACERONE) 200 MG tablet Take 0.5 tablets (100 mg total) by mouth daily. 12/22/20   Bensimhon, Shaune Pascal, MD  aspirin 81 MG tablet Take 1 tablet (81 mg total) by mouth daily. 09/14/17   Tonia Ghent, MD  carvedilol (COREG) 25 MG tablet TAKE 1 TABLET BY MOUTH TWICE DAILY 12/16/20   Tonia Ghent, MD  Cholecalciferol (VITAMIN D) 1000 UNITS capsule Take 1,000 Units by mouth daily.    [provider]  furosemide (LASIX) 20 MG tablet 1 tab on Tuesday, Thursday, Saturday 03/19/21   Tonia Ghent, MD  glucose blood (ACCU-CHEK AVIVA PLUS) test strip USE TO  TEST BLOOD SUGAR ONCE DAILY FOR DM E11.9 08/09/19   Tonia Ghent, MD  JARDIANCE 10 MG TABS tablet TAKE 1/2 TABLET BY MOUTH DAILY BEFORE BREAKFAST 01/06/21   Bensimhon, Shaune Pascal, MD  Lancets (ACCU-CHEK MULTICLIX) lancets USE ONE LANCET TO TEST BLOOD GLUCOSE DAILY FOR DM 250.00 08/09/19   Tonia Ghent, MD  lovastatin (MEVACOR) 40 MG tablet TAKE 1 TABLET BY MOUTH AT BEDTIME 02/15/21   Tonia Ghent, MD  metFORMIN (GLUCOPHAGE) 500 MG tablet Take 0.5 tablets (250 mg total) by mouth 2 (two) times daily with a meal. 07/02/20   Tonia Ghent, MD  sacubitril-valsartan (ENTRESTO) 97-103 MG Take 1 tablet by mouth 2 (two) times daily. 01/05/21   Bensimhon, Shaune Pascal, MD  traMADol (ULTRAM) 50 MG tablet TAKE 1 TABLET(50 MG) BY MOUTH EVERY 12 HOURS 03/19/21   Tonia Ghent, MD    Family History Family History  Problem Relation Age of Onset  . Heart disease Mother        s/p pacemaker  . Diabetes Mother   . Heart disease Father   . Diabetes Father   . Heart disease Brother        MI  . Cancer Brother        Lung  . Hypertension Other   . Colon cancer Neg Hx   . Breast cancer Neg Hx     Social History Social History   Tobacco Use  . Smoking status: Former  Smoker    Years: 28.00    Types: Cigarettes    Quit date: 12/12/1993    Years since quitting: 27.3  . Smokeless tobacco: Never Used  Vaping Use  . Vaping Use: Never used  Substance Use Topics  . Alcohol use: No    Alcohol/week: 0.0 standard drinks  . Drug use: No     Allergies   Ezetimibe-simvastatin, Lipitor [atorvastatin], Tamiflu [oseltamivir phosphate], Pravastatin sodium, and Sulfonamide derivatives   Review of Systems Review of Systems  Constitutional: Negative for appetite change, chills and fever.  HENT: Positive for congestion. Negative for ear pain, rhinorrhea, sinus pressure, sinus pain and sore throat.   Eyes: Negative for redness and visual disturbance.  Respiratory: Positive for cough. Negative for chest tightness, shortness of breath and wheezing.   Cardiovascular: Negative for chest pain and palpitations.  Gastrointestinal: Negative for abdominal pain, constipation, diarrhea, nausea and vomiting.  Genitourinary: Negative for dysuria, frequency and urgency.  Musculoskeletal: Negative for myalgias.  Neurological: Negative for dizziness, weakness and headaches.  Psychiatric/Behavioral: Negative for confusion.  All other systems reviewed and are negative.    Physical Exam Triage Vital Signs ED Triage Vitals  Enc Vitals Group     BP 04/27/21 1157 121/64     Pulse Rate 04/27/21 1157 62     Resp 04/27/21 1157 (!) 22     Temp 04/27/21 1157 98.6 F (37 C)     Temp Source 04/27/21 1157 Oral     SpO2 04/27/21 1157 92 %     Weight --      Height --      Head Circumference --      Peak Flow --      Pain Score 04/27/21 1158 0     Pain Loc --      Pain Edu? --      Excl. in [place]? --    No data found.  Updated Vital Signs BP 121/64 (BP Location: Left Arm)   Pulse 62   Temp 98.6  F (37 C) (Oral)   Resp (!) 22   SpO2 94%   Visual Acuity Right Eye Distance:   Left Eye Distance:   Bilateral Distance:    Right Eye Near:   Left Eye Near:    Bilateral  Near:     Physical Exam Vitals reviewed.  Constitutional:      General: She is not in acute distress.    Appearance: Normal appearance. She is well-developed. She is not ill-appearing or diaphoretic.  HENT:     Head: Normocephalic and atraumatic.     Right Ear: Hearing, tympanic membrane, ear canal and external ear normal. No drainage. No middle ear effusion. Tympanic membrane is not perforated, erythematous, retracted or bulging.     Left Ear: Hearing, tympanic membrane, ear canal and external ear normal. No drainage.  No middle ear effusion. Tympanic membrane is not perforated, erythematous, retracted or bulging.     Nose: Nose normal.     Right Sinus: No maxillary sinus tenderness or frontal sinus tenderness.     Left Sinus: No maxillary sinus tenderness or frontal sinus tenderness.     Mouth/Throat:     Mouth: Mucous membranes are moist.     Pharynx: Uvula midline. No oropharyngeal exudate, posterior oropharyngeal erythema or uvula swelling.     Tonsils: No tonsillar exudate.  Eyes:     Extraocular Movements: Extraocular movements intact.     Pupils: Pupils are equal, round, and reactive to light.  Cardiovascular:     Rate and Rhythm: Normal rate and regular rhythm.     Pulses:          Radial pulses are 2+ on the right side and 2+ on the left side.     Heart sounds: Normal heart sounds.  Pulmonary:     Effort: Pulmonary effort is normal. Tachypnea (22) present.     Breath sounds: Normal air entry. Examination of the right-lower field reveals rales. Examination of the left-lower field reveals rales. Rales present. No decreased breath sounds, wheezing or rhonchi.  Abdominal:     General: Bowel sounds are normal.     Palpations: Abdomen is soft.     Tenderness: There is no abdominal tenderness. There is no guarding or rebound. Negative signs include Murphy's sign and McBurney's sign.  Musculoskeletal:     Right lower leg: No edema.     Left lower leg: No edema.  Lymphadenopathy:      Cervical: No cervical adenopathy.  Skin:    General: Skin is warm.     Capillary Refill: Capillary refill takes less than 2 seconds.  Neurological:     General: No focal deficit present.     Mental Status: She is alert and oriented to person, place, and time.  Psychiatric:        Attention and Perception: Attention and perception normal.        Mood and Affect: Mood and affect normal.        Behavior: Behavior normal. Behavior is cooperative.        Thought Content: Thought content normal.        Judgment: Judgment normal.      UC Treatments / Results  Labs (all labs ordered are listed, but only abnormal results are displayed) Labs Reviewed  NOVEL CORONAVIRUS, NAA    EKG   Radiology DG Chest 2 View  Result Date: 04/27/2021 CLINICAL DATA:  Shortness of breath EXAM: CHEST - 2 VIEW COMPARISON:  October 10, 2019 FINDINGS: Lungs are clear. Heart  size and pulmonary vascularity are normal. No adenopathy. There is aortic atherosclerosis. There is degenerative change in the thoracic spine. IMPRESSION: Lungs clear. Heart size normal. Aortic Atherosclerosis (ICD10-I70.0). Electronically Signed   By: Lowella Grip III M.D.   On: 04/27/2021 14:16    Procedures Procedures (including critical care time)  Medications Ordered in UC Medications - No data to display  Initial Impression / Assessment and Plan / UC Course  I have reviewed the triage vital signs and the nursing notes.  Pertinent labs & imaging results that were available during my care of the patient were reviewed by me and considered in my medical decision making (see chart for details).     This patient is an 80 year old female presenting with 3 weeks of productive cough, nasal congestion, dyspnea on exertion. She is afebrile, nontachycardic, borderline tachypneic at 22, pulse ox 94%. No pedal edema, dizziness, weakness, chest pain. Appears comfortable.  CXR- no cardiopulmonary disease.  Pt with history heart  failure. EKG mildly bradycardic at 58, with t wave changes that are stable since 3/22 EKG. Weight is stable- she 3 pounds lighter than 4/8 reading.   Very low suspicion for pneumonia, CHF exacerbation.   Covid PCR sent.   Plan to treat with trial of zyrtec.   F/u with PCP as scheduled 5/19.   Strict ED return precautions discussed.  Final Clinical Impressions(s) / UC Diagnoses   Final diagnoses:  Cough  Encounter for screening for COVID-19  Essential hypertension  Chronic congestive heart failure, unspecified heart failure type ([place])  Seasonal allergic rhinitis due to pollen     Discharge Instructions     -I suspect that your congestion and cough is due to allergies. Let's try an over-the-counter allergy medication like Zyrtec for the next few days, until your follow-up with your PCP.  -We're screening you for covid today to make sure that isn't contributing to your symptoms.  -Your chest x-ray looks good today.  There is no fluid on the lungs. -Your EKG does not show any changes in your heart function. -For heart failure, continue Lasix. -For hypertension, continue carvedilol, Entresto. -Follow-up with your PCP as scheduled on 5/19. -If you develop worsening shortness of breath, or new symptoms like dizziness, chest pain, weakness in your arms or legs, worse headache of life, confusion-call 911.    ED Prescriptions    None     PDMP not reviewed this encounter.   Hazel Sams, PA-C 04/27/21 [phone]

## 2021-04-27 NOTE — Telephone Encounter (Signed)
Patient discharge note is done. They did PCR test which is still pending and they advised patient to keep scheduled appt here on the 19th. Please advise.

## 2021-04-27 NOTE — ED Triage Notes (Signed)
Patient presents to [facility] for evaluation of 3 weeks of cough and chest congestion.  Patient states her doctor sent her here for a chest xray to r/o CHF or pneumonia.

## 2021-04-28 NOTE — Telephone Encounter (Signed)
Please check on her covid results tomorrow AM. They are still pending currently.  Would keep the OV as scheduled.  Thanks.

## 2021-04-29 ENCOUNTER — Ambulatory Visit (INDEPENDENT_AMBULATORY_CARE_PROVIDER_SITE_OTHER): Payer: Medicare Other | Admitting: Family Medicine

## 2021-04-29 ENCOUNTER — Other Ambulatory Visit: Payer: Self-pay

## 2021-04-29 ENCOUNTER — Encounter: Payer: Self-pay | Admitting: Family Medicine

## 2021-04-29 DIAGNOSIS — R059 Cough, unspecified: Secondary | ICD-10-CM

## 2021-04-29 MED ORDER — BENZONATATE 200 MG PO CAPS: 200.0000 mg | ORAL_CAPSULE | Freq: Three times a day (TID) | ORAL | 1 refills | Status: DC | PRN

## 2021-04-29 NOTE — Patient Instructions (Signed)
I would continue zyrtec.  Take tessalon if needed for cough.  If not getting better then let me know.   Take care.  Glad to see you.

## 2021-04-29 NOTE — Progress Notes (Signed)
This visit occurred during the SARS-CoV-2 public health emergency.  Safety protocols were in place, including screening questions prior to the visit, additional usage of staff PPE, and extensive cleaning of exam room while observing appropriate contact time as indicated for disinfecting solutions.  Coughing for about 3 weeks.  Taking alka seltzer.  Cough is slowly getting better with zyrtec.  No fevers.  No vomiting.  No diarrhea.  No ear or facial pain.  No ST.  Some sputum, pale yellow, throughout the day.  Not waking from cough.  Drinking cold water causes the cough and that is atypical.  covid test pending.  CXR w/o PNA.    Meds, vitals, and allergies reviewed.   ROS: Per HPI unless specifically indicated in ROS section   GEN: nad, alert and oriented HEENT: mucous membranes moist, OP wnl, nasal exam minimally stuffy. NECK: supple w/o LA CV: rrr PULM: ctab, no inc wob ABD: soft, +bs EXT: no edema SKIN: Well-perfused.

## 2021-04-29 NOTE — Telephone Encounter (Signed)
COVID test still pending

## 2021-05-02 NOTE — Assessment & Plan Note (Signed)
Lungs are clear and I do not suspect infection.  More likely be related to allergic/environmental trigger with increased cough reflex/trigger.  Continue Zyrtec and use Tessalon as needed and update me as needed.  Okay for outpatient follow-up.  She agrees with plan.

## 2021-05-11 ENCOUNTER — Other Ambulatory Visit: Payer: Self-pay | Admitting: Family Medicine

## 2021-05-18 ENCOUNTER — Encounter: Payer: Self-pay | Admitting: Family Medicine

## 2021-05-30 ENCOUNTER — Other Ambulatory Visit: Payer: Self-pay | Admitting: Family Medicine

## 2021-07-14 ENCOUNTER — Telehealth (HOSPITAL_COMMUNITY): Payer: Self-pay | Admitting: Pharmacy Technician

## 2021-07-14 ENCOUNTER — Telehealth (HOSPITAL_COMMUNITY): Payer: Self-pay | Admitting: Pharmacist

## 2021-07-14 ENCOUNTER — Other Ambulatory Visit (HOSPITAL_COMMUNITY): Payer: Self-pay

## 2021-07-14 MED ORDER — ENTRESTO 97-103 MG PO TABS
1.0000 | ORAL_TABLET | Freq: Two times a day (BID) | ORAL | 3 refills | Status: DC
Start: 2021-07-14 — End: 2021-12-09

## 2021-07-14 MED ORDER — ENTRESTO 97-103 MG PO TABS: 1.0000 | ORAL_TABLET | Freq: Two times a day (BID) | ORAL | 3 refills | Status: DC

## 2021-07-14 NOTE — Telephone Encounter (Signed)
Advanced Heart Failure Patient Advocate Encounter  Patient called and left a message about Entresto. Looks like a RX was sent to her local pharmacy, she currently has [employer] assistance for [employer] through 12/11/21. Lauren, [facility]) sent a new RX to [employer].  Called and left the patient a message with [employer] phone number in order to place a refill.  Archer Asa, CPhT

## 2021-07-14 NOTE — Telephone Encounter (Signed)
Meds ordered this encounter  Medications   sacubitril-valsartan (ENTRESTO) 97-103 MG    Sig: Take 1 tablet by mouth 2 (two) times daily.    Dispense:  180 tablet    Refill:  3

## 2021-07-14 NOTE — Telephone Encounter (Signed)
Natasha Woodard prescription sent to [facility] as patient receives [place] through [employer] PAP.  Karle Plumber, PharmD, BCPS, BCCP, CPP Heart Failure Clinic Pharmacist [phone]

## 2021-07-30 ENCOUNTER — Telehealth: Payer: Self-pay

## 2021-07-30 NOTE — Telephone Encounter (Signed)
Per appt notes pt already has appt with Dr Selena Batten on 08/02/2021 at 10:40. Sending note to Dr Selena Batten, Lorain Childes to Dr Para March as PCP and Shanda Bumps CMA.

## 2021-07-30 NOTE — Telephone Encounter (Signed)
[facility] - Client TELEPHONE ADVICE RECORD AccessNurse Patient Name: Natasha WIL LIAMS Gender: Female DOB: 09/02/1941 Age: 79 Y 11 M 4 D Return Phone Number: [phone] (Primary) Address: City/ State/ Zip: [place] [address] Client [facility] - Client Client Site  [facility] Day Physician Duncan, Shaw - MD Contact Type Call Who Is Calling Patient / Member / Family / Caregiver Call Type Triage / Clinical Relationship To Patient Self Return Phone Number [phone] (Primary) Chief Complaint Skin Lesion - Moles/ Lumps/ Growths Reason for Call Symptomatic / Request for Health Information Initial Comment Caller is being transferred for triage and she states that Dr. Duncan is booked until the 25th. The patient is complaining of a bump that showed up on her arm and she is concerned. She said she would like a voicemail if she doesn't answer because her phone sometimes blocks calls. Transferred to non urgent queue. Translation No Nurse Assessment Nurse: Hughes, RN, Angela Date/Time (Eastern Time): 07/30/2021 2:22:13 PM Confirm and document reason for call. If symptomatic, describe symptoms. ---Caller states she has a bump on her left wrist that came up last night. No other symptoms Does the patient have any new or worsening symptoms? ---Yes Will a triage be completed? ---Yes Related visit to physician within the last 2 weeks? ---No Does the PT have any chronic conditions? (i.e. diabetes, asthma, this includes High risk factors for pregnancy, etc.) ---Yes List chronic conditions. ---chf, hypertension, diabetes, cholesterol Is this a behavioral health or substance abuse call? ---No Guidelines Guideline Title Affirmed Question Affirmed Notes Nurse Date/Time (Eastern Time) Wrist Swelling Small firm or hard BALL OR LUMP Hughes, RN, Angela 07/30/2021 2:24:56 PM Disp. Time  (Eastern Time) Disposition Final User 07/30/2021 2:28:23 PM See PCP within 2 Weeks Yes Hughes, RN, Angela PLEASE NOTE: All timestamps contained within this report are represented as Eastern Standard Time. CONFIDENTIALTY NOTICE: This fax transmission is intended only for the addressee. It contains information that is legally privileged, confidential or otherwise protected from use or disclosure. If you are not the intended recipient, you are strictly prohibited from reviewing, disclosing, copying using or disseminating any of this information or taking any action in reliance on or regarding this information. If you have received this fax in error, please notify us immediately by telephone so that we can arrange for its return to us. Phone: [phone], Toll-Free: [phone], Fax: [phone] Page: 2 of 2 Call Id: [id number] Caller Disagree/Comply Comply Caller Understands Yes PreDisposition Did not know what to do Care Advice Given Per Guideline SEE PCP WITHIN 2 WEEKS: * You need to be seen for this ongoing problem within the next 2 weeks. CALL BACK IF: * Fever occurs * Wrist becomes red * You become worse CARE ADVICE given per Wrist Swelling (Adult) guideline. Referrals REFERRED TO PCP OFFICE 

## 2021-08-01 NOTE — Telephone Encounter (Signed)
Noted. Thanks.

## 2021-08-02 ENCOUNTER — Ambulatory Visit: Payer: Medicare Other | Admitting: Family Medicine

## 2021-08-02 ENCOUNTER — Telehealth: Payer: Self-pay | Admitting: Family Medicine

## 2021-08-02 NOTE — Chronic Care Management (AMB) (Signed)
  Chronic Care Management   Outreach Note  08/02/2021 Name: Natasha Woodard MRN: [id number] DOB: [birth year]  Referred by: [name], MD Reason for referral : No chief complaint on file.   An unsuccessful telephone outreach was attempted today. The patient was referred to the pharmacist for assistance with care management and care coordination.   Follow Up Plan:   Tatjana Dellinger Upstream Scheduler

## 2021-08-02 NOTE — Telephone Encounter (Signed)
Pt canceled appt   Routing to pcp as fyi

## 2021-08-03 ENCOUNTER — Telehealth: Payer: Self-pay | Admitting: Family Medicine

## 2021-08-03 NOTE — Progress Notes (Signed)
  Chronic Care Management   Note  08/03/2021 Name: [name] MRN: [id number] DOB: May 01, 1941  [name] is a 80 y.o. year old female who is a primary care patient of [name], MD. I reached out to [name] by phone today in response to a referral sent by Ms. Francee Piccolo PCP, [name], MD.   [name] was given information about Chronic Care Management services today including:  CCM service includes personalized support from designated clinical staff supervised by her physician, including individualized plan of care and coordination with other care providers 24/7 contact phone numbers for assistance for urgent and routine care needs. Service will only be billed when office clinical staff spend 20 minutes or more in a month to coordinate care. Only one practitioner may furnish and bill the service in a calendar month. The patient may stop CCM services at any time (effective at the end of the month) by phone call to the office staff.   Patient agreed to services and verbal consent obtained.   Follow up plan:   Tatjana Restaurant manager, fast food

## 2021-08-03 NOTE — Telephone Encounter (Signed)
Noted. Thanks.  I'll defer to patient.  

## 2021-08-05 NOTE — Telephone Encounter (Signed)
Yes - it is oK  Bryan Lemma, MD

## 2021-08-06 ENCOUNTER — Ambulatory Visit: Payer: Medicare Other | Admitting: Family Medicine

## 2021-08-13 NOTE — Telephone Encounter (Signed)
Screening tests are difficult:  Carotid plaque screening and abdominal aortic aneurysm assessment are reasonable (but not necessary) along with osteoporosis risk assessment which is bone density.  For the carotid and aortic aneurysm screening purposes, we do have a relatively affordable screening set offered through our office if she wants to discuss during a follow-up appointment.  No rush.  Unless you are having symptoms I would not recommend heart rhythm or peripheral artery disease screening.   Bryan Lemma, MD

## 2021-08-19 ENCOUNTER — Ambulatory Visit (INDEPENDENT_AMBULATORY_CARE_PROVIDER_SITE_OTHER): Payer: Medicare Other | Admitting: Family Medicine

## 2021-08-19 ENCOUNTER — Encounter: Payer: Self-pay | Admitting: Family Medicine

## 2021-08-19 ENCOUNTER — Other Ambulatory Visit: Payer: Self-pay

## 2021-08-19 VITALS — BP 130/70 | HR 55 | Temp 97.5°F | Ht 62.0 in | Wt 172.0 lb

## 2021-08-19 DIAGNOSIS — Z23 Encounter for immunization: Secondary | ICD-10-CM

## 2021-08-19 DIAGNOSIS — M67439 Ganglion, unspecified wrist: Secondary | ICD-10-CM

## 2021-08-19 DIAGNOSIS — E119 Type 2 diabetes mellitus without complications: Secondary | ICD-10-CM | POA: Diagnosis not present

## 2021-08-19 DIAGNOSIS — I739 Peripheral vascular disease, unspecified: Secondary | ICD-10-CM | POA: Diagnosis not present

## 2021-08-19 LAB — POCT GLYCOSYLATED HEMOGLOBIN (HGB A1C): Hemoglobin A1C: 6.2 % — AB (ref 4.0–5.6)

## 2021-08-19 MED ORDER — METFORMIN HCL 500 MG PO TABS: ORAL_TABLET | ORAL | Status: DC

## 2021-08-19 NOTE — Patient Instructions (Addendum)
Let me update cardiology about your legs.  We may need to do extra testing about that.  Plan on recheck labs prior to a physical/yearly visit in about 6 months.   I would cut metformin back to 1/2 tab a day.   Take care.  Glad to see you.
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
## 2021-08-22 ENCOUNTER — Telehealth: Payer: Self-pay | Admitting: Family Medicine

## 2021-08-22 DIAGNOSIS — I70213 Atherosclerosis of native arteries of extremities with intermittent claudication, bilateral legs: Secondary | ICD-10-CM | POA: Insufficient documentation

## 2021-08-22 DIAGNOSIS — M67439 Ganglion, unspecified wrist: Secondary | ICD-10-CM | POA: Insufficient documentation

## 2021-08-22 DIAGNOSIS — I739 Peripheral vascular disease, unspecified: Secondary | ICD-10-CM

## 2021-08-22 HISTORY — DX: Atherosclerosis of native arteries of extremities with intermittent claudication, bilateral legs: I70.213

## 2021-08-22 NOTE — Assessment & Plan Note (Signed)
Reassured.  Would not need intervention.  She can update me as needed.

## 2021-08-22 NOTE — Assessment & Plan Note (Signed)
She has some leg aches with walking 1/5 of a mile.  No CP.  No edema. Sx get better with stopping.  She walks about 1 mile in total but with stopping for breaks about every 1/5 of a mile.   No rest pain.  No exertional chest pain.    I will update cardiology about this and ask for input.  I will cardiology input about the method of evaluation going forward.  She has bilaterally decreased dorsalis pedis pulses compared to her radial pulses but her dorsalis pedis pulses are not absent.

## 2021-08-22 NOTE — Telephone Encounter (Signed)
I need your input.  Patient has intermittent exertional claudication that gets better with rest.  She does not have chest pain or rest pain.  She has decreased but not absent dorsalis pedis pulses bilaterally.  Can you get her in for evaluation at your clinic or should we send her elsewhere for arterial dopplers before you see her in clinic?  What would your preference be?  I greatly appreciate your help.  Thank you.

## 2021-08-22 NOTE — Assessment & Plan Note (Signed)
A1c improved.  Discussed options.  I do not want to induce hypoglycemia.  I would cut metformin back to half tablet a day.  She can update me as needed about her sugar in the meantime.  See after visit summary.

## 2021-08-22 NOTE — Telephone Encounter (Signed)
I greatly appreciate input from Dr. Herbie Baltimore.  Please update patient that cardiology will call her about testing the blood vessels in her legs and she should expect a call about scheduling.  Thanks.

## 2021-08-23 NOTE — Telephone Encounter (Signed)
Patient is scheduled for 09/09/21

## 2021-08-23 NOTE — Telephone Encounter (Signed)
Patient made aware that orders are going in to get the ultrasounds of her legs. She will expect call from schedulers to get done and ask that they leave a message if she doesn't answer.  No additional questions at this time.

## 2021-08-23 NOTE — Addendum Note (Signed)
Addended by: Theressa Stamps on: 08/23/2021 09:23 AM   Modules accepted: Orders

## 2021-08-25 NOTE — Telephone Encounter (Signed)
Thanks  DH 

## 2021-08-25 NOTE — Telephone Encounter (Signed)
I thank all involved. 

## 2021-08-26 ENCOUNTER — Ambulatory Visit: Payer: Medicare Other | Admitting: Cardiology

## 2021-08-28 ENCOUNTER — Other Ambulatory Visit: Payer: Self-pay | Admitting: Family Medicine

## 2021-08-28 ENCOUNTER — Other Ambulatory Visit: Payer: Self-pay | Admitting: Cardiology

## 2021-08-28 ENCOUNTER — Other Ambulatory Visit (HOSPITAL_COMMUNITY): Payer: Self-pay | Admitting: Internal Medicine

## 2021-09-09 ENCOUNTER — Encounter: Payer: Self-pay | Admitting: Cardiology

## 2021-09-09 ENCOUNTER — Ambulatory Visit (HOSPITAL_COMMUNITY)
Admission: RE | Admit: 2021-09-09 | Discharge: 2021-09-09 | Disposition: A | Discharge status: Home / Self Care | Payer: Medicare Other | Source: Ambulatory Visit | Attending: Cardiology | Admitting: Cardiology

## 2021-09-09 ENCOUNTER — Ambulatory Visit: Payer: Medicare Other | Admitting: Cardiology

## 2021-09-09 ENCOUNTER — Other Ambulatory Visit: Payer: Self-pay

## 2021-09-09 VITALS — BP 162/88 | HR 58 | Ht 62.0 in | Wt 171.8 lb

## 2021-09-09 DIAGNOSIS — I5032 Chronic diastolic (congestive) heart failure: Secondary | ICD-10-CM

## 2021-09-09 DIAGNOSIS — I10 Essential (primary) hypertension: Secondary | ICD-10-CM | POA: Diagnosis not present

## 2021-09-09 DIAGNOSIS — I739 Peripheral vascular disease, unspecified: Secondary | ICD-10-CM | POA: Diagnosis not present

## 2021-09-09 DIAGNOSIS — I70213 Atherosclerosis of native arteries of extremities with intermittent claudication, bilateral legs: Secondary | ICD-10-CM | POA: Insufficient documentation

## 2021-09-09 DIAGNOSIS — Z79899 Other long term (current) drug therapy: Secondary | ICD-10-CM

## 2021-09-09 DIAGNOSIS — E119 Type 2 diabetes mellitus without complications: Secondary | ICD-10-CM

## 2021-09-09 DIAGNOSIS — E1151 Type 2 diabetes mellitus with diabetic peripheral angiopathy without gangrene: Secondary | ICD-10-CM | POA: Diagnosis not present

## 2021-09-09 DIAGNOSIS — E782 Mixed hyperlipidemia: Secondary | ICD-10-CM

## 2021-09-09 DIAGNOSIS — I493 Ventricular premature depolarization: Secondary | ICD-10-CM

## 2021-09-09 NOTE — Patient Instructions (Signed)
Medication Instructions:   No changes  *If you need a refill on your cardiac medications before your next appointment, please call your pharmacy*   Lab Work:  Not needed   Testing/Procedures: Not needed   Follow-Up: At [facility], you and your health needs are our priority.  As part of our continuing mission to provide you with exceptional heart care, we have created designated Provider Care Teams.  These Care Teams include your primary Cardiologist (physician) and Advanced Practice Providers (APPs -  Physician Assistants and Nurse Practitioners) who all work together to provide you with the care you need, when you need it.     Your next appointment:   6 month(s)  The format for your next appointment:   In Person  Provider:   Bryan Lemma, MD   Other Instructions   You have been referred to Dr Kirke Corin - to discuss  lower extremities doppler

## 2021-09-09 NOTE — Progress Notes (Signed)
Primary Care Provider: Tonia Ghent, MD Cardiologist: Glenetta Hew, MD Electrophysiologist: None  Clinic Note: Chief Complaint  Patient presents with   Follow-up    73-monthfollow-up to discuss results of Dopplers   Claudication    Astute finding from PCP suggesting that her walking is limited by claudication and not arthritic pain-Dopplers ordered per his request.  Results reviewed     ===================================  ASSESSMENT/PLAN   Problem List Items Addressed This Visit       Cardiology Problems   Frequent PVCs (Chronic)    Significant PVC burden noted during initial evaluation.  Now on amiodarone for suppression which has led to full recovery of her EF.  Continue to manage with high-dose carvedilol and suppression dose amiodarone 100 mg daily.  Thankfully, she has not had any V. Tach.    Continue to monitor for amiodarone toxicity      Relevant Orders   EKG 12-Lead   Chronic heart failure with preserved ejection fraction (HFpEF) (HCC) (Chronic)    Basically resolved nonischemic cardiomyopathy now with some diastolic dysfunction is residual.  NYHA class I symptoms with dyspnea be more related now it sounds like claudication and deconditioning.  She does not get as much short of breath if she does have the leg pains.  She remains on stable medications including high-dose Entresto and carvedilol along with Jardiance. She takes furosemide 20 mg 3 days a week and has not required any additional dosing.  Now with peripheral arterial disease documented, hopefully this can be treated successfully, we can have her increase her activity level.      Relevant Orders   EKG 12-Lead   HLD (hyperlipidemia) - Primary (Chronic)    Labs as of April showed LDL of 89 on lovastatin.  I suspect now that we have identified what appears to be occlusive PAD, would probably want to convert from lovastatin to likely rosuvastatin.  We can address this in follow-up.       Essential hypertension (Chronic)    Blood pressure is high today, but not usually.  Every other time its been really controlled I think she is just very anxious today.  Continue 25 mg twice daily carvedilol and 97-103 mg twice daily Entresto.  At this point would prefer to avoid adding another medication until we see what her pressures do when she is more mobile.  She has been trying to be active, and we have now identified that she is limited by claudication.  I suspect that she would like to continue to stay active and is definitely making an effort to change her diet because she still lost 5 pounds.      Type II diabetes mellitus with peripheral artery disease (HCC) (Chronic)    Now with evidence of occlusive PAD, she now has diabetes with complication.  She is on metformin and Jardiance with an A1c of 6.2.  Well-controlled      Relevant Orders   Ambulatory Referral for Peripheral Vascular Consult     Other   On amiodarone therapy    Continue to follow LFTs, and TFTs.  As mentioned previously, we are holding off on PFTs unless she has symptoms.  Just follow-up with annual ESR and CRP as markers of pulmonary toxicity which would be inflammatory.  This can be added to her routine annual screening labs.      Intermittent claudication of both lower extremities due to atherosclerosis (HCC) (Chronic)    Retrospectively, her leg pain with walking may very  well have been claudication based on the results of her LE Dopplers.  Appears to have bilateral SFA occlusion.  Question if this could be approached percutaneously.  I have placed a referral to Dr. Midge Minium to discuss possibility of PAD intervention.  I do not think that she would be interested in bilateral femoropopliteal bypass.  I encouraged her to continue walking as this is definitely proving to provide collaterals keeping the distal flow patent. I did not add cilostazol, but would be an option but I would defer to Dr. Fletcher Anon        ===================================  HPI:    Natasha Woodard is a 80 y.o. female with a PMH notable for RESOLVED NONISCHEMIC CARDIOMYOPATHY (Related to Frequent PVCs-on Long-Term Amiodarone; EF Improved from 25-30% up to 60 to 65%) who presents today for 3-monthfollow-up.  PERTINENT CARDIAC HISTORY July 2018: Initial evaluation -> diagnosed with dilated cardiomyopathy (EF 25 to 30%), started on carvedilol, Entresto and spironolactone;  monitor indicated 17% PVCs R&LHC: Normal coronaries, PCWP of 16 and LVEDP of 26. -->  Referred to Dr. BHaroldine Lawsfrom advanced CHF clinic Started on amiodarone for PVCs -> EF gradually progressed to 35 and 40% by June 2019 and then by January 2021 was up to 60 to 65%.  Now on maintenance dose amiodarone 100 mg daily for PVC suppression. Symptoms essentially resolved.  Now only rarely uses Lasix (no more than 3 days a week) after the addition of Jardiance. Exercises routinely at the gym COVID-19 infection in October 2020, she actually went to the ER on 10/10/2019 for intractable cough and weakness, but went to the ER because of hallucination related to hypoxia.  Had 2 more weeks of quarantine.  Slow gradual recovery.  CMAMYE BOLDSwas last seen on March 04, 2021--doing pretty well.  Upset because she was not yet [facility]  She noted an unsteady gait and joint pains that limit her walking more than dyspnea.  She was noting better stamina overall and adjusted her medication dosing time to 1:30 PM and 10:30 PM.  No med changes made.   She graduated from the CHF clinic on March 31, 2021 with a visit to see Dr. BHaroldine Laws -> Recommendation was to continue on 100 mg daily amiodarone along with max dose Entresto, 18.75 mg twice daily and carvedilol, 12.5 mg daily spironolactone and Jardiance.  Felt to be NYHA class I-II CHF.  EF was back to 60 to 65%.  Recent Hospitalizations:  04/27/2021-ER/urgent care  visit for allergy related cough.  She was seen by Dr. DDamita Dunningson August 19, 2021, she noted bilateral leg discomfort after walking about 1/5 a mile.  No chest pain or edema.  She walks a total of a mile but has to stop several times.  As a result, she was referred for Lower Extremity Arterial Dopplers  Reviewed  CV studies:    The following studies were reviewed today: (if available, images/films reviewed: From Epic Chart or Care Everywhere) LEA Dopplers 09/09/2021:  Right: Total occlusion noted in the superficial femoral artery. Atherosclerosis noted throughout extremity, see note above. Three vessel runoff.  Left: Total occlusion noted in the superficial femoral artery and/or popliteal artery. Total occlusion noted in the distal anterior tibial artery. Athereosclerosis noted throughout extremity.   Interval History:   Natasha QAZIreturns today for 670-monthollow-up but also discuss results from her lower extremity Dopplers performed today which showed significant bilateral SFA disease.  Retrospectively, it does seem like her limitations of walking very well could have been claudication and not arthritis pains.  He could have just been her misconstrue points of the symptoms.  But clearly claudication is a possibility.  She has to stop several times to complete her mile, but denies any dyspnea or chest pain. Overall, very stable from a cardiac standpoint.  Edema well controlled only taking furosemide 3 days a week with no additional dosing for weight gain.  She is very good about monitoring her weights and blood pressure.  She is upset about her blood pressure today, but seems to be upset about discussing the Doppler results.  She tells me that she has been trying to lose weight and thinks that she has lost weight since I saw her before (and indeed she has lost about 5 pounds).  She actually looks and feels pretty well with the exception of the leg discomfort with walking.  When asked her more  closely about the discomfort in her legs she had a hard time telling me where it was hurting, she says initially she felt like the discomfort was in her buttock but on further questioning it sounds like it was in her calfs.  No resting pain, but she pointed to the sign indicating PAD and asked if that is what it was.  We reviewed the results of her Dopplers together and discussed plans to refer to vascular cardiology which may lead to vascular surgery.  CV Review of Symptoms (Summary) Cardiovascular ROS: positive for - SHE complains of what now appears to be limiting claudication -> she just cannot delineate if it is buttock calf or thigh that is limiting her walking. negative for - chest pain, dyspnea on exertion, edema, irregular heartbeat, orthopnea, palpitations, paroxysmal nocturnal dyspnea, rapid heart rate, shortness of breath, or lightheadedness or dizziness, syncope/near syncope or TIA/amaurosis fugax  REVIEWED OF SYSTEMS   Review of Systems  Constitutional:  Positive for weight loss (Intentional). Negative for malaise/fatigue.  HENT:  Positive for congestion (Allergies).   Respiratory:  Positive for cough (Allergies).   Cardiovascular:  Positive for claudication (Per HPI).  Gastrointestinal:  Negative for blood in stool and melena.  Genitourinary:  Positive for frequency (Nocturia). Negative for hematuria.  Musculoskeletal:  Positive for back pain and joint pain. Negative for falls.       Also notes buttock pain  Neurological:  Positive for dizziness (Sometimes when she first gets up at night to go to the bathroom but otherwise not routinely). Negative for weakness.  Endo/Heme/Allergies:  Positive for environmental allergies.  Psychiatric/Behavioral:  Negative for depression and memory loss. The patient is nervous/anxious (Baseline). The patient does not have insomnia.    I have reviewed and (if needed) personally updated the patient's problem list, medications, allergies, past  medical and surgical history, social and family history.   PAST MEDICAL HISTORY   Past Medical History:  Diagnosis Date   Calcific tendonitis 2008   Treatment of left leg   Cardiomyopathy ([place]) 06/2017   a) Echo: EF 25-30%. GR 1-2 DD w/ elevated LVEDP. Mild valvular Dz; b) Cardiac MRI 2/'19: frequent PVCs (diffiuclt to interpret) - EF ~27% w/ diffuse HK.  No evidence of infarct, infiltrative Dz or myocarditis. -- ? if related to PVCs. c) f/u Echo 6/'19: EF 35-40%. Gr 1 DD. Diffuse HK.-> d) Jan 2020 EF 50 to 55%.  Mod MAC, mod LAE.  Ao Sclerosis; e) Echo 1/'21 - EF 60-65%, Gr II DD. Mild Mod MR.  Chronic combined systolic and diastolic CHF, NYHA class 2 and ACA/AHA stage C    Now essentially resolved, back to simply diastolic CHF   Coronary artery disease, non-occlusive    mild-moderate CAD 06/30/17 cath   CTS (carpal tunnel syndrome)    Diabetes mellitus    Type II   Dysrhythmia    patient said that she cant remember what it is   Frequent unifocal PVCs 01/2018   Event monitor showed normal sinus rhythm with frequent multifocal PVCs (9%) and PACs.  Nighttime bradycardia suggestive of OSA.   Hiatal hernia    with reflux   Hyperlipidemia    Statin intolerant   Hypertension    Ichthyosis congenita    Intermittent claudication of both lower extremities due to atherosclerosis ([place]) 08/22/2021   LEA Dopplers 09/09/2021:  Right: Total occlusion noted in the superficial femoral artery. Atherosclerosis noted throughout extremity, see note above. Three vessel runoff.  Left: Total occlusion noted in the superficial femoral artery and/or popliteal artery. Total occlusion noted in the distal anterior tibial artery. Athereosclerosis noted throughout extremity.    NSVD (normal spontaneous vaginal delivery) 1971 & 1972   Obesity    Plantar fasciitis    Left   Pulmonary nodule    Imaged multiple times and benign appearing   Wears glasses     PAST SURGICAL HISTORY   Past Surgical History:   Procedure Laterality Date   ABDOMINAL HYSTERECTOMY  1975   TAH-BSO   ABSCESS DRAINAGE  1972   right breast   BREAST BIOPSY Left 01/14/2009   Stereo- Benign   CARDIAC MRI  01/2018   Difficult to interpret 2/2 PVCs. Normal LV size - EF ~27% with diffuse HK. Mild RV dilation - normal fxn. -- NO MYOCARDIAL LGI => no definitive evidence of prior MI, Infiltrative Dz or Myocarditis -- suspect NICM, possibly related to PVCs.   CARPAL TUNNEL RELEASE Right 07/02/2014   Procedure: RIGHT CARPAL TUNNEL RELEASE;  Surgeon: Wynonia Sours, MD;  Location: [facility];  Service: Orthopedics;  Laterality: Right;   CARPAL TUNNEL RELEASE Left 06/11/2015   Procedure: LEFT CARPAL TUNNEL RELEASE;  Surgeon: Daryll Brod, MD;  Location: [place];  Service: Orthopedics;  Laterality: Left;  REGIONAL/FAB   COLONOSCOPY     corn removal  1968   right   Holter Monitor  06/2017   ~17,000 PVC beats - majority were singlets, some couplets. 44 brief 3-7 beat runs of NSVT. Also noted were less frequent PACs with 11 runs (longest 15 beats)   OPEN REDUCTION INTERNAL FIXATION (ORIF) DISTAL RADIAL FRACTURE Left 07/21/2017   Procedure: OPEN REDUCTION INTERNAL FIXATION (ORIF) LEFT DISTAL RADIAL FRACTURE;  Surgeon: Renette Butters, MD;  Location: [facility] [place];  Service: Orthopedics;  Laterality: Left;   RIGHT/LEFT HEART CATH AND CORONARY ANGIOGRAPHY N/A 06/30/2017   Procedure: Right/Left Heart Cath and Coronary Angiography;  Surgeon: Leonie Man, MD;  [facility] INVASIVE CV LAB:  pRCA 55%, pCx 40%, OM1 45%, mCx 50%, D2 50% - LVEF 25-35%. Moderately elevated LVEDP(26 mmHg with PCWP 16 mmHg).  FICK CO/CI: 4.47/2.48. PA pressures 47/14 mmHg with a mean of 27 mm.   ROTATOR CUFF REPAIR     left shoulder   TRANSTHORACIC ECHOCARDIOGRAM  06/2017   EF 25 and 30%. GR 1 DD. Mild diastolic dysfunction with elevated LVEDP. Mild valvular disease.   TRANSTHORACIC ECHOCARDIOGRAM  11'18, 6/'19    a) EF remains 30-35%.   Diffuse hypokinesis noted.  Severe LA  dilation; b) Improved EF 35-40%.  Diffuse HK.  GR 1 DD.  Moderate TR.  Mild RV dilation.   TRANSTHORACIC ECHOCARDIOGRAM  1/'20; 1/'21   a) EF 50 to 55%. Gr I DD. (Notabley improved LV Fxn.  Moderate LA dilation.  Mod MAC.  AoV sclerosis w/o stenosis;; b) EF 60 to 65%.  Normal LV size and function.  GRII DD.  Mild to moderate MR.  Normal PA pressures.   ZIO Patch 14 d Event Monitor  10/2018   Results as below < 1% PVC.  (Notably improved after starting amiodarone)    Immunization History  Administered Date(s) Administered   Fluad Quad(high Dose 65+) 08/19/2021   Influenza Whole 08/12/2010   Influenza,inj,Quad PF,6+ Mos 08/19/2014, 08/15/2015, 09/02/2016, 08/05/2019   Influenza-Unspecified 09/11/2018   PFIZER Comirnaty(Gray Top)Covid-19 Tri-Sucrose Vaccine 03/23/2021   PFIZER(Purple Top)SARS-COV-2 Vaccination 02/09/2020, 02/29/2020, 09/15/2020   Pneumococcal Conjugate-13 03/10/2015   Pneumococcal Polysaccharide-23 05/26/2006   Td 06/14/2007   Zoster Recombinat (Shingrix) 08/12/2020, 10/12/2020   Zoster, Live 08/10/2006    MEDICATIONS/ALLERGIES   Current Meds  Medication Sig   amiodarone (PACERONE) 200 MG tablet TAKE 1/2 TABLET BY MOUTH EVERY DAY   aspirin 81 MG tablet Take 1 tablet (81 mg total) by mouth daily.   benzonatate (TESSALON) 200 MG capsule Take 1 capsule (200 mg total) by mouth 3 (three) times daily as needed for cough.   carvedilol (COREG) 25 MG tablet TAKE 1 TABLET BY MOUTH TWICE DAILY   cetirizine (ZYRTEC) 10 MG tablet Take 10 mg by mouth daily.   Cholecalciferol (VITAMIN D) 1000 UNITS capsule Take 1,000 Units by mouth daily.   furosemide (LASIX) 20 MG tablet TAKE 1 TABLET BY MOUTH EVERY MONDAY, WEDNESDAY, AND FRIDAY   glucose blood (ACCU-CHEK AVIVA PLUS) test strip USE TO TEST BLOOD SUGAR ONCE DAILY FOR DM E11.9   JARDIANCE 10 MG TABS tablet TAKE 1/2 TABLET BY MOUTH DAILY BEFORE BREAKFAST   Lancets (ACCU-CHEK MULTICLIX) lancets USE  ONE LANCET TO TEST BLOOD GLUCOSE DAILY FOR DM 250.00   lovastatin (MEVACOR) 40 MG tablet TAKE 1 TABLET BY MOUTH AT BEDTIME   metFORMIN (GLUCOPHAGE) 500 MG tablet TAKE 1/2 TABLET BY MOUTH TWICE DAILY WITH A MEAL   sacubitril-valsartan (ENTRESTO) 97-103 MG Take 1 tablet by mouth 2 (two) times daily.   traMADol (ULTRAM) 50 MG tablet TAKE 1 TABLET(50 MG) BY MOUTH EVERY 12 HOURS    Allergies  Allergen Reactions   Ezetimibe-Simvastatin Other (See Comments)    REACTION: Muscle aches (side effect)   Lipitor [Atorvastatin] Other (See Comments)    Leg weakness    Tamiflu [Oseltamivir Phosphate] Other (See Comments)    nightmares   Pravastatin Sodium Other (See Comments)    REACTION: Muscle aches (side effect)   Sulfonamide Derivatives Nausea And Vomiting    SOCIAL HISTORY/FAMILY HISTORY   Reviewed in Epic:  Pertinent findings:  Social History   Tobacco Use   Smoking status: Former    Years: 28.00    Types: Cigarettes    Quit date: 12/12/1993    Years since quitting: 27.7   Smokeless tobacco: Never  Vaping Use   Vaping Use: Never used  Substance Use Topics   Alcohol use: No    Alcohol/week: 0.0 standard drinks   Drug use: No   Social History   Social History Narrative   Two kids, two grandchildren, two great grandchildren.    Married 1969   Retired.     OBJCTIVE -PE, EKG, labs   Wt Readings  from Last 3 Encounters:  09/09/21 171 lb 12.8 oz (77.9 kg)  08/19/21 172 lb (78 kg)  04/29/21 175 lb (79.4 kg)    Physical Exam: BP (!) 162/88   Pulse (!) 58   Ht _0  (1.575 m)   Wt 171 lb 12.8 oz (77.9 kg)   SpO2 98%   BMI 31.42 kg/m  Physical Exam Vitals reviewed.  Constitutional:      General: She is not in acute distress.    Appearance: Normal appearance. She is obese. She is not ill-appearing (Remarkably healthy appearing.).  HENT:     Head: Normocephalic and atraumatic.  Neck:     Vascular: No carotid bruit, hepatojugular reflux or JVD.  Cardiovascular:     Rate  and Rhythm: Normal rate and regular rhythm. No extrasystoles are present.    Chest Wall: PMI is not displaced.     Pulses: Decreased pulses.          Femoral pulses are 2+ on the right side and 2+ on the left side.      Popliteal pulses are 1+ on the right side and 1+ on the left side.       Dorsalis pedis pulses are 1+ on the right side and 0 on the left side.       Posterior tibial pulses are 1+ on the right side and 1+ on the left side.     Heart sounds: S1 normal and S2 normal. Heart sounds are distant. No murmur heard.   No friction rub. No gallop.  Pulmonary:     Effort: Pulmonary effort is normal. No respiratory distress.     Breath sounds: Normal breath sounds. No wheezing, rhonchi or rales.  Chest:     Chest wall: No tenderness.  Musculoskeletal:        General: No swelling.     Cervical back: Normal range of motion and neck supple.  Skin:    General: Skin is warm and dry.     Comments: Stable skin condition-ichthyosis  Neurological:     General: No focal deficit present.     Mental Status: She is alert and oriented to person, place, and time.     Motor: No weakness.     Gait: Gait abnormal (Still using a cane).  Psychiatric:        Mood and Affect: Mood normal.        Thought Content: Thought content normal.        Judgment: Judgment normal.       Adult ECG Report  Rate: 58;  Rhythm: normal sinus rhythm and LVH with nonspecific IVCD and nonspecific ST and T wave changes. ; Normal axis and intervals.  Narrative Interpretation: Stable  Recent Labs: Reviewed Lab Results  Component Value Date   CHOL 141 03/19/2021   HDL 42.10 03/19/2021   LDLCALC 81 03/19/2021   LDLDIRECT 53 08/10/2009   TRIG 89.0 03/19/2021   CHOLHDL 3 03/19/2021   Lab Results  Component Value Date   CREATININE 1.13 03/19/2021   BUN 17 03/19/2021   NA 142 03/19/2021   K 4.7 03/19/2021   CL 105 03/19/2021   CO2 30 03/19/2021   CBC Latest Ref Rng & Units 03/19/2021 10/09/2020 10/10/2019   WBC 4.0 - 10.5 K/uL 4.3 3.9 3.7(L)  Hemoglobin 12.0 - 15.0 g/dL 13.6 14.2 12.9  Hematocrit 36.0 - 46.0 % 41.8 44.9 41.3  Platelets 150.0 - 400.0 K/uL 333.0 379 302    Lab Results  Component Value  Date   HGBA1C 6.2 (A) 08/19/2021   Lab Results  Component Value Date   TSH 1.23 03/19/2021    ==================================================  COVID-19 Education: The signs and symptoms of COVID-19 were discussed with the patient and how to seek care for testing (follow up with PCP or arrange E-visit).    I spent a total of 57mnutes with the patient spent in direct patient consultation.  Additional time spent with chart review  / charting (studies, outside notes, etc): 20 min Total Time: 54 min  Current medicines are reviewed at length with the patient today.  (+/- concerns) n/as  This visit occurred during the SARS-CoV-2 public health emergency.  Safety protocols were in place, including screening questions prior to the visit, additional usage of staff PPE, and extensive cleaning of exam room while observing appropriate contact time as indicated for disinfecting solutions.  Notice: This dictation was prepared with Dragon dictation along with smart phrase technology. Any transcriptional errors that result from this process are unintentional and may not be corrected upon review.  Patient Instructions / Medication Changes & Studies & Tests Ordered   Patient Instructions  Medication Instructions:   No changes  *If you need a refill on your cardiac medications before your next appointment, please call your pharmacy*   Lab Work:  Not needed   Testing/Procedures: Not needed   Follow-Up: [facility] you and your health needs are our priority.  As part of our continuing mission to provide you with exceptional heart care, we have created designated Provider Care Teams.  These Care Teams include your primary Cardiologist (physician) and Advanced Practice Providers (APPs -   Physician Assistants and Nurse Practitioners) who all work together to provide you with the care you need, when you need it.     Your next appointment:   6 month(s)  The format for your next appointment:   In Person  Provider:   DGlenetta Hew MD   Other Instructions   You have been referred to Dr AFletcher Anon- to discuss  lower extremities doppler    Studies Ordered:   Orders Placed This Encounter  Procedures   Ambulatory Referral for Peripheral Vascular Consult   EKG 12-Lead      DGlenetta Hew M.D., M.S. Interventional Cardiologist   Pager # [phone] # [phone] [place] [address]  Thank you for choosing [facility]!

## 2021-09-10 ENCOUNTER — Encounter: Payer: Self-pay | Admitting: Cardiology

## 2021-09-10 NOTE — Assessment & Plan Note (Signed)
Significant PVC burden noted during initial evaluation.  Now on amiodarone for suppression which has led to full recovery of her EF.  Continue to manage with high-dose carvedilol and suppression dose amiodarone 100 mg daily.  Thankfully, she has not had any V. Tach.    Continue to monitor for amiodarone toxicity

## 2021-09-10 NOTE — Assessment & Plan Note (Signed)
Blood pressure is high today, but not usually.  Every other time its been really controlled I think she is just very anxious today.  Continue 25 mg twice daily carvedilol and 97-103 mg twice daily Entresto.  At this point would prefer to avoid adding another medication until we see what her pressures do when she is more mobile.  She has been trying to be active, and we have now identified that she is limited by claudication.  I suspect that she would like to continue to stay active and is definitely making an effort to change her diet because she still lost 5 pounds.

## 2021-09-10 NOTE — Assessment & Plan Note (Signed)
Basically resolved nonischemic cardiomyopathy now with some diastolic dysfunction is residual.  NYHA class I symptoms with dyspnea be more related now it sounds like claudication and deconditioning.  She does not get as much short of breath if she does have the leg pains.  She remains on stable medications including high-dose Entresto and carvedilol along with Jardiance. She takes furosemide 20 mg 3 days a week and has not required any additional dosing.  Now with peripheral arterial disease documented, hopefully this can be treated successfully, we can have her increase her activity level.

## 2021-09-10 NOTE — Assessment & Plan Note (Signed)
Retrospectively, her leg pain with walking may very well have been claudication based on the results of her LE Dopplers.  Appears to have bilateral SFA occlusion.  Question if this could be approached percutaneously.  I have placed a referral to Dr. Romilda Joy to discuss possibility of PAD intervention.  I do not think that she would be interested in bilateral femoropopliteal bypass.  I encouraged her to continue walking as this is definitely proving to provide collaterals keeping the distal flow patent. I did not add cilostazol, but would be an option but I would defer to Dr. Kirke Corin

## 2021-09-10 NOTE — Assessment & Plan Note (Signed)
Now with evidence of occlusive PAD, she now has diabetes with complication.  She is on metformin and Jardiance with an A1c of 6.2.  Well-controlled

## 2021-09-10 NOTE — Assessment & Plan Note (Signed)
Labs as of April showed LDL of 89 on lovastatin.  I suspect now that we have identified what appears to be occlusive PAD, would probably want to convert from lovastatin to likely rosuvastatin.  We can address this in follow-up.

## 2021-09-10 NOTE — Assessment & Plan Note (Signed)
Continue to follow LFTs, and TFTs.  As mentioned previously, we are holding off on PFTs unless she has symptoms.  Just follow-up with annual ESR and CRP as markers of pulmonary toxicity which would be inflammatory.  This can be added to her routine annual screening labs.

## 2021-09-11 ENCOUNTER — Other Ambulatory Visit: Payer: Self-pay | Admitting: Family Medicine

## 2021-09-13 NOTE — Telephone Encounter (Signed)
Refill request for Tramadol 50 mg tablets  LOV - 08/19/21 Next OV - not scheduled Last refill - 03/19/21 #60/5

## 2021-09-14 ENCOUNTER — Ambulatory Visit (INDEPENDENT_AMBULATORY_CARE_PROVIDER_SITE_OTHER): Payer: Medicare Other | Admitting: Cardiovascular Disease

## 2021-09-14 ENCOUNTER — Encounter: Payer: Self-pay | Admitting: Cardiovascular Disease

## 2021-09-14 ENCOUNTER — Other Ambulatory Visit: Payer: Self-pay

## 2021-09-14 VITALS — BP 148/85 | HR 68 | Ht 62.0 in | Wt 172.6 lb

## 2021-09-14 DIAGNOSIS — I493 Ventricular premature depolarization: Secondary | ICD-10-CM

## 2021-09-14 DIAGNOSIS — E785 Hyperlipidemia, unspecified: Secondary | ICD-10-CM | POA: Diagnosis not present

## 2021-09-14 DIAGNOSIS — I739 Peripheral vascular disease, unspecified: Secondary | ICD-10-CM | POA: Diagnosis not present

## 2021-09-14 DIAGNOSIS — I5022 Chronic systolic (congestive) heart failure: Secondary | ICD-10-CM

## 2021-09-14 NOTE — Telephone Encounter (Signed)
Sent. Thanks.   

## 2021-09-14 NOTE — Patient Instructions (Signed)
Medication Instructions:  No changes *If you need a refill on your cardiac medications before your next appointment, please call your pharmacy*   Lab Work: None ordered If you have labs (blood work) drawn today and your tests are completely normal, you will receive your results only by: MyChart Message (if you have MyChart) OR A paper copy in the mail If you have any lab test that is abnormal or we need to change your treatment, we will call you to review the results.   Testing/Procedures: None ordered   Follow-Up: At [facility], you and your health needs are our priority.  As part of our continuing mission to provide you with exceptional heart care, we have created designated Provider Care Teams.  These Care Teams include your primary Cardiologist (physician) and Advanced Practice Providers (APPs -  Physician Assistants and Nurse Practitioners) who all work together to provide you with the care you need, when you need it.  We recommend signing up for the patient portal called "MyChart".  Sign up information is provided on this After Visit Summary.  MyChart is used to connect with patients for Virtual Visits (Telemedicine).  Patients are able to view lab/test results, encounter notes, upcoming appointments, etc.  Non-urgent messages can be sent to your provider as well.   To learn more about what you can do with MyChart, go to [email or web address].    Your next appointment:   4 month(s)  The format for your next appointment:   In Person  Provider:   Muhammad Arida, MD     

## 2021-09-14 NOTE — Progress Notes (Signed)
Cardiology Office Note   Date:  09/14/2021   ID:  Natasha Woodard 1941-01-02, MRN [id number]  PCP:  Tonia Ghent, MD  Cardiologist:  Dr. Ellyn Hack  No chief complaint on file.     History of Present Illness: Natasha Woodard is a 80 y.o. female who was referred by Dr. Ellyn Hack for evaluation management of peripheral arterial disease. She has known history of chronic systolic heart failure due to nonischemic cardiomyopathy with normalization of LV systolic function, PVCs, mild to moderate nonobstructive coronary artery disease, diabetes mellitus, essential hypertension, stage III chronic kidney disease, hyperlipidemia with statin intolerance and recently diagnosed peripheral arterial disease.  She quit smoking more than 30 years ago.  She reports bilateral calf discomfort after walking about 1 block.  This started 5 to 6 years ago but has progressed over the last year.  No rest pain or lower extremity ulceration.  She denies chest pain or worsening dyspnea.  She underwent recent noninvasive vascular studies which showed an ABI of 0.55 on the right and 0.60 on the left.  Duplex showed bilateral SFA occlusion.  Past Medical History:  Diagnosis Date   Calcific tendonitis 2008   Treatment of left leg   Cardiomyopathy ([place]) 06/2017   a) Echo: EF 25-30%. GR 1-2 DD w/ elevated LVEDP. Mild valvular Dz; b) Cardiac MRI 2/'19: frequent PVCs (diffiuclt to interpret) - EF ~27% w/ diffuse HK.  No evidence of infarct, infiltrative Dz or myocarditis. -- ? if related to PVCs. c) f/u Echo 6/'19: EF 35-40%. Gr 1 DD. Diffuse HK.-> d) Jan 2020 EF 50 to 55%.  Mod MAC, mod LAE.  Ao Sclerosis; e) Echo 1/'21 - EF 60-65%, Gr II DD. Mild Mod MR.    Chronic combined systolic and diastolic CHF, NYHA class 2 and ACA/AHA stage C    Now essentially resolved, back to simply diastolic CHF   Coronary artery disease, non-occlusive    mild-moderate CAD 06/30/17 cath   CTS (carpal tunnel syndrome)    Diabetes  mellitus    Type II   Dysrhythmia    patient said that she cant remember what it is   Frequent unifocal PVCs 01/2018   Event monitor showed normal sinus rhythm with frequent multifocal PVCs (9%) and PACs.  Nighttime bradycardia suggestive of OSA.   Hiatal hernia    with reflux   Hyperlipidemia    Statin intolerant   Hypertension    Ichthyosis congenita    Intermittent claudication of both lower extremities due to atherosclerosis ([place]) 08/22/2021   LEA Dopplers 09/09/2021:  Right: Total occlusion noted in the superficial femoral artery. Atherosclerosis noted throughout extremity, see note above. Three vessel runoff.  Left: Total occlusion noted in the superficial femoral artery and/or popliteal artery. Total occlusion noted in the distal anterior tibial artery. Athereosclerosis noted throughout extremity.    NSVD (normal spontaneous vaginal delivery) 1971 & 1972   Obesity    Plantar fasciitis    Left   Pulmonary nodule    Imaged multiple times and benign appearing   Wears glasses     Past Surgical History:  Procedure Laterality Date   ABDOMINAL HYSTERECTOMY  1975   TAH-BSO   ABSCESS DRAINAGE  1972   right breast   BREAST BIOPSY Left 01/14/2009   Stereo- Benign   CARDIAC MRI  01/2018   Difficult to interpret 2/2 PVCs. Normal LV size - EF ~27% with diffuse HK. Mild RV dilation - normal fxn. -- NO MYOCARDIAL LGI =>  no definitive evidence of prior MI, Infiltrative Dz or Myocarditis -- suspect NICM, possibly related to PVCs.   CARPAL TUNNEL RELEASE Right 07/02/2014   Procedure: RIGHT CARPAL TUNNEL RELEASE;  Surgeon: Wynonia Sours, MD;  Location: [place];  Service: Orthopedics;  Laterality: Right;   CARPAL TUNNEL RELEASE Left 06/11/2015   Procedure: LEFT CARPAL TUNNEL RELEASE;  Surgeon: Daryll Brod, MD;  Location: [place];  Service: Orthopedics;  Laterality: Left;  REGIONAL/FAB   COLONOSCOPY     corn removal  1968   right   Holter Monitor  06/2017    ~17,000 PVC beats - majority were singlets, some couplets. 44 brief 3-7 beat runs of NSVT. Also noted were less frequent PACs with 11 runs (longest 15 beats)   OPEN REDUCTION INTERNAL FIXATION (ORIF) DISTAL RADIAL FRACTURE Left 07/21/2017   Procedure: OPEN REDUCTION INTERNAL FIXATION (ORIF) LEFT DISTAL RADIAL FRACTURE;  Surgeon: Renette Butters, MD;  Location: [place];  Service: Orthopedics;  Laterality: Left;   RIGHT/LEFT HEART CATH AND CORONARY ANGIOGRAPHY N/A 06/30/2017   Procedure: Right/Left Heart Cath and Coronary Angiography;  Surgeon: Leonie Man, MD;  Location: [facility] INVASIVE CV LAB:  pRCA 55%, pCx 40%, OM1 45%, mCx 50%, D2 50% - LVEF 25-35%. Moderately elevated LVEDP(26 mmHg with PCWP 16 mmHg).  FICK CO/CI: 4.47/2.48. PA pressures 47/14 mmHg with a mean of 27 mm.   ROTATOR CUFF REPAIR     left shoulder   TRANSTHORACIC ECHOCARDIOGRAM  06/2017   EF 25 and 30%. GR 1 DD. Mild diastolic dysfunction with elevated LVEDP. Mild valvular disease.   TRANSTHORACIC ECHOCARDIOGRAM  11'18, 6/'19    a) EF remains 30-35%.  Diffuse hypokinesis noted.  Severe LA dilation; b) Improved EF 35-40%.  Diffuse HK.  GR 1 DD.  Moderate TR.  Mild RV dilation.   TRANSTHORACIC ECHOCARDIOGRAM  1/'20; 1/'21   a) EF 50 to 55%. Gr I DD. (Notabley improved LV Fxn.  Moderate LA dilation.  Mod MAC.  AoV sclerosis w/o stenosis;; b) EF 60 to 65%.  Normal LV size and function.  GRII DD.  Mild to moderate MR.  Normal PA pressures.   ZIO Patch 14 d Event Monitor  10/2018   Results as below < 1% PVC.  (Notably improved after starting amiodarone)     Current Outpatient Medications  Medication Sig Dispense Refill   amiodarone (PACERONE) 200 MG tablet TAKE 1/2 TABLET BY MOUTH EVERY DAY 90 tablet 3   aspirin 81 MG tablet Take 1 tablet (81 mg total) by mouth daily.     benzonatate (TESSALON) 200 MG capsule Take 1 capsule (200 mg total) by mouth 3 (three) times daily as needed for cough. 30 capsule 1   carvedilol (COREG) 25 MG  tablet TAKE 1 TABLET BY MOUTH TWICE DAILY 180 tablet 3   cetirizine (ZYRTEC) 10 MG tablet Take 10 mg by mouth daily.     Cholecalciferol (VITAMIN D) 1000 UNITS capsule Take 1,000 Units by mouth daily.     furosemide (LASIX) 20 MG tablet TAKE 1 TABLET BY MOUTH EVERY MONDAY, WEDNESDAY, AND FRIDAY 20 tablet 6   glucose blood (ACCU-CHEK AVIVA PLUS) test strip USE TO TEST BLOOD SUGAR ONCE DAILY FOR DM E11.9 100 each 1   JARDIANCE 10 MG TABS tablet TAKE 1/2 TABLET BY MOUTH DAILY BEFORE BREAKFAST 15 tablet 11   Lancets (ACCU-CHEK MULTICLIX) lancets USE ONE LANCET TO TEST BLOOD GLUCOSE DAILY FOR DM 250.00 102 each 3   lovastatin (MEVACOR)  40 MG tablet TAKE 1 TABLET BY MOUTH AT BEDTIME 90 tablet 1   metFORMIN (GLUCOPHAGE) 500 MG tablet TAKE 1/2 TABLET BY MOUTH TWICE DAILY WITH A MEAL 90 tablet 1   sacubitril-valsartan (ENTRESTO) 97-103 MG Take 1 tablet by mouth 2 (two) times daily. 180 tablet 3   traMADol (ULTRAM) 50 MG tablet TAKE 1 TABLET(50 MG) BY MOUTH EVERY 12 HOURS 60 tablet 5   No current facility-administered medications for this visit.    Allergies:   Ezetimibe-simvastatin, Lipitor [atorvastatin], Tamiflu [oseltamivir phosphate], Pravastatin sodium, and Sulfonamide derivatives    Social History:  The patient  reports that she quit smoking about 27 years ago. Her smoking use included cigarettes. She has never used smokeless tobacco. She reports that she does not drink alcohol and does not use drugs.   Family History:  The patient's family history includes Cancer in her brother; Diabetes in her father and mother; Heart disease in her brother, father, and mother; Hypertension in an other family member.    ROS:  Please see the history of present illness.   Otherwise, review of systems are positive for none.   All other systems are reviewed and negative.    PHYSICAL EXAM: VS:  BP (!) 148/85   Pulse 68   Ht _0  (1.575 m)   Wt 172 lb 9.6 oz (78.3 kg)   SpO2 98%   BMI 31.57 kg/m  , BMI  Body mass index is 31.57 kg/m. GEN: Well nourished, well developed, in no acute distress  HEENT: normal  Neck: no JVD, carotid bruits, or masses Cardiac: RRR; no murmurs, rubs, or gallops,no edema  Respiratory:  clear to auscultation bilaterally, normal work of breathing GI: soft, nontender, nondistended, + BS MS: no deformity or atrophy  Skin: warm and dry, no rash Neuro:  Strength and sensation are intact Psych: euthymic mood, full affect Vascular: Femoral pulse: +2 bilaterally.  Distal pulses are not palpable.   EKG:  EKG is not ordered today.    Recent Labs: 03/19/2021: ALT 9; BUN 17; Creatinine, Ser 1.13; Hemoglobin 13.6; Platelets 333.0; Potassium 4.7; Sodium 142; TSH 1.23    Lipid Panel    Component Value Date/Time   CHOL 141 03/19/2021 1121   CHOL 139 10/09/2020 0933   TRIG 89.0 03/19/2021 1121   HDL 42.10 03/19/2021 1121   HDL 42 10/09/2020 0933   CHOLHDL 3 03/19/2021 1121   VLDL 17.8 03/19/2021 1121   LDLCALC 81 03/19/2021 1121   LDLCALC 80 10/09/2020 0933   LDLDIRECT 53 08/10/2009 0000      Wt Readings from Last 3 Encounters:  09/14/21 172 lb 9.6 oz (78.3 kg)  09/09/21 171 lb 12.8 oz (77.9 kg)  08/19/21 172 lb (78 kg)       No flowsheet data found.    ASSESSMENT AND PLAN:  1.  Peripheral arterial disease: Severe bilateral calf claudication due to SFA occlusion bilaterally.  I discussed with her the natural history and management of claudication.  This is not a limb threatening situation.  I discussed different options of revascularization including surgery or endovascular.  I did advise her to leave these options as a last resort.  I have recommended starting a daily walking program and she is going to do that for at least 3 months before we consider anything else. I discussed with her the option of adding low-dose Xarelto and she might be willing to consider that in the near future.  2.  PVCs: Seems to be well suppressed  with low-dose amiodarone and  carvedilol.  3.  History of chronic systolic heart failure: Seems to be well compensated and currently on optimal medical therapy.  4.  Hyperlipidemia: Currently on lovastatin with intolerance to other statins.  Most recent lipid profile showed an LDL of 80.    Disposition:   FU with me in 4 months  Signed,  Kathlyn Sacramento, MD  09/14/2021 8:25 AM    [place]

## 2021-09-15 NOTE — Telephone Encounter (Signed)
Patient is following up, requesting a call to clarify what the below message means.

## 2021-09-16 ENCOUNTER — Encounter: Payer: Self-pay | Admitting: *Deleted

## 2021-09-17 ENCOUNTER — Encounter: Payer: Self-pay | Admitting: *Deleted

## 2021-09-22 ENCOUNTER — Telehealth: Payer: Self-pay

## 2021-09-22 NOTE — Progress Notes (Signed)
Chronic Care Management Pharmacy Assistant   Name: Natasha Woodard  MRN: [id number] DOB: 02-Feb-1941  Reason for Encounter: Initial Questions    Recent office visits:  08/19/2021 - Crawford Givens, MD - Patient presented for follow up Diabetes. Labs: A1c. Change: metFORMIN (GLUCOPHAGE) 500 MG tablet. Take half tablet by mouth daily with meal (previously twice a day).  04/29/2021 -  Crawford Givens, MD - Patient presented for cough. Start: benzonatate (TESSALON) 200 MG capsule. 04/06/2021 - Crawford Givens, MD - [place] presented for Annual Wellness Visit. Stop: omeprazole (PRILOSEC) 20 MG capsule as no longer needed. Labs: CXR and EKG. Start: OTC Zyrtec.   Recent consult visits:  09/14/2021 - Lorine Bears, MD - Cardiology - Patient presented for evaluation management of peripheral arterial disease. No medication changes.   09/09/2021 - Bryan Lemma, MD - Cardiology - Patient presented for follow up on Hyperlipidemia. Labs. EKG. Referral for Peripheral Vascular Consult. suspect now that we have identified what appears to be occlusive PAD, would probably want to convert from lovastatin to likely rosuvastatin.  We can address this in follow-up. 09/09/2021 - [facility] - Patient presented for [facility]. No other information.  04/27/2021 - [facility] - Patient presented for with cough, chest congestion.  03/31/2021 - Arvilla Meres - Cardiology - Patient presented for congestive heart failure. No medication changes.   Hospital visits: None in previous 6 months  Medications: Outpatient Encounter Medications as of 09/22/2021  Medication Sig   amiodarone (PACERONE) 200 MG tablet TAKE 1/2 TABLET BY MOUTH EVERY DAY   aspirin 81 MG tablet Take 1 tablet (81 mg total) by mouth daily.   benzonatate (TESSALON) 200 MG capsule Take 1 capsule (200 mg total) by mouth 3 (three) times daily as needed for cough.   carvedilol (COREG) 25 MG tablet TAKE 1 TABLET BY MOUTH TWICE DAILY    cetirizine (ZYRTEC) 10 MG tablet Take 10 mg by mouth daily.   Cholecalciferol (VITAMIN D) 1000 UNITS capsule Take 1,000 Units by mouth daily.   furosemide (LASIX) 20 MG tablet TAKE 1 TABLET BY MOUTH EVERY MONDAY, WEDNESDAY, AND FRIDAY   glucose blood (ACCU-CHEK AVIVA PLUS) test strip USE TO TEST BLOOD SUGAR ONCE DAILY FOR DM E11.9   JARDIANCE 10 MG TABS tablet TAKE 1/2 TABLET BY MOUTH DAILY BEFORE BREAKFAST   Lancets (ACCU-CHEK MULTICLIX) lancets USE ONE LANCET TO TEST BLOOD GLUCOSE DAILY FOR DM 250.00   lovastatin (MEVACOR) 40 MG tablet TAKE 1 TABLET BY MOUTH AT BEDTIME   metFORMIN (GLUCOPHAGE) 500 MG tablet TAKE 1/2 TABLET BY MOUTH TWICE DAILY WITH A MEAL   sacubitril-valsartan (ENTRESTO) 97-103 MG Take 1 tablet by mouth 2 (two) times daily.   traMADol (ULTRAM) 50 MG tablet TAKE 1 TABLET(50 MG) BY MOUTH EVERY 12 HOURS   No facility-administered encounter medications on file as of 09/22/2021.    Lab Results  Component Value Date/Time   HGBA1C 6.2 (A) 08/19/2021 11:19 AM   HGBA1C 6.6 (H) 03/19/2021 11:21 AM   HGBA1C 6.0 (A) 12/25/2020 02:33 PM   HGBA1C 6.3 (H) 10/09/2020 09:35 AM     BP Readings from Last 3 Encounters:  09/14/21 (!) 148/85  09/09/21 (!) 162/88  08/19/21 130/70    Patient contacted to review initial questions prior to visit with Al Corpus.  Have you seen any other providers since your last visit with PCP? Yes  Any changes in your medications or health? No  Any side effects from any medications? No  Do  you have an symptoms or problems not managed by your medications? No  Any concerns about your health right now? No  Has your provider asked that you check blood pressure, blood sugar, or follow special diet at home? Yes Patient states she checks blood pressure once a week and blood sugar once a week. I have asked patient to check and log her BP and glucose daily from now until appointment with Al Corpus.   Do you get any type of exercise on a  regular basis? Yes Walks every day for 30 minutes.   Can you think of a goal you would like to reach for your health? Yes Patient wants to get her blood pressure and sugar down. She would; also like to increase walking.   Do you have any problems getting your medications? No  Is there anything that you would like to discuss during the appointment? Yes Does she need another vaccine for Covid? She has had the original vaccine and three boosters for four total.   [name] was reminded to have all medications, supplements and any blood glucose and blood pressure readings available for review with Al Corpus, Pharm. D, at her telephone visit on 09/29/2021 at 11:00 AM.   Star Rating Drugs:  Medication:    Last Fill: Day Supply Metformin 500mg    08/09/2021 90    Lovastatin 40mg    09/04/2021 90    Jardiance 10mg    09/17/2021  30 Sacubitril-Valsartan 97-103mg  07/2019  Pharmacy has a script waiting for Sacubitril-Valsartan 97-103mg  from 07/14/2021 that has never been picked up.  All fill dates verified through [facility].   Care Gaps: Annual wellness visit in last year? Yes 03/25/2021 Most Recent BP reading: 145/85 on 09/14/2021   If Diabetic: Most recent A1C reading: 6.2 on 08/19/2021 Last eye exam / retinopathy screening: Up to date Last diabetic foot exam: Up to date  03/27/2021, CPP notified  11/14/2021, 10/19/2021 Clinical Pharmacy Assistant [phone]   Time Spent: 40 minutes

## 2021-09-27 ENCOUNTER — Telehealth: Payer: Self-pay | Admitting: Family Medicine

## 2021-09-27 ENCOUNTER — Encounter: Payer: Self-pay | Admitting: Family Medicine

## 2021-09-27 MED ORDER — ACCU-CHEK AVIVA PLUS VI STRP: ORAL_STRIP | 1 refills | Status: DC

## 2021-09-27 NOTE — Telephone Encounter (Signed)
  Encourage patient to contact the pharmacy for refills or they can request refills through [facility]  LAST APPOINTMENT DATE:  Please schedule appointment if longer than 1 year  NEXT APPOINTMENT DATE:  MEDICATION: glucose blood test strips  Is the patient out of medication? yes  PHARMACY: [facility] in [place] on [address]  Let patient know to contact pharmacy at the end of the day to make sure medication is ready.  Please notify patient to allow 48-72 hours to process  CLINICAL FILLS OUT ALL BELOW:   LAST REFILL:  QTY:  REFILL DATE:    OTHER COMMENTS:    Okay for refill?  Please advise

## 2021-09-27 NOTE — Telephone Encounter (Signed)
Rx sent 

## 2021-09-29 ENCOUNTER — Telehealth: Payer: Self-pay

## 2021-09-29 ENCOUNTER — Ambulatory Visit (INDEPENDENT_AMBULATORY_CARE_PROVIDER_SITE_OTHER): Payer: Medicare Other | Admitting: Pharmacist

## 2021-09-29 ENCOUNTER — Other Ambulatory Visit: Payer: Self-pay

## 2021-09-29 DIAGNOSIS — I10 Essential (primary) hypertension: Secondary | ICD-10-CM

## 2021-09-29 DIAGNOSIS — E1151 Type 2 diabetes mellitus with diabetic peripheral angiopathy without gangrene: Secondary | ICD-10-CM

## 2021-09-29 DIAGNOSIS — I5032 Chronic diastolic (congestive) heart failure: Secondary | ICD-10-CM

## 2021-09-29 DIAGNOSIS — E782 Mixed hyperlipidemia: Secondary | ICD-10-CM

## 2021-09-29 DIAGNOSIS — I70213 Atherosclerosis of native arteries of extremities with intermittent claudication, bilateral legs: Secondary | ICD-10-CM

## 2021-09-29 NOTE — Patient Instructions (Signed)
Visit Information  Phone number for Pharmacist: [phone]  Thank you for meeting with me to discuss your medications! I look forward to working with you to achieve your health care goals. Below is a summary of what we talked about during the visit:   Goals Addressed             This Visit's Progress    Track and Manage My Blood Pressure-Hypertension       Timeframe:  Short-Term Goal Priority:  High Start Date:      09/29/21                       Expected End Date:    11/29/21                   Follow Up Date Nov 2022   - check blood pressure twice daily - after AM meds and before PM meds - choose a place to take my blood pressure (home, clinic or office, retail store) - write blood pressure results in a log or diary    Why is this important?   You won't feel high blood pressure, but it can still hurt your blood vessels.  High blood pressure can cause heart or kidney problems. It can also cause a stroke.  Making lifestyle changes like losing a little weight or eating less salt will help.  Checking your blood pressure at home and at different times of the day can help to control blood pressure.  If the doctor prescribes medicine remember to take it the way the doctor ordered.  Call the office if you cannot afford the medicine or if there are questions about it.     Notes:         Care Plan : CCM Pharmacy Care Plan  Updates made by [name], RPH since 09/29/2021 12:00 AM     Problem: Hypertension, Hyperlipidemia, Diabetes, Heart Failure, and Chronic Kidney Disease, PAD   Priority: High     Long-Range Goal: Disease management   Start Date: 09/29/2021  Expected End Date: 09/29/2022  This Visit's Progress: On track  Priority: High  Note:    Current Barriers:  Unable to independently monitor therapeutic efficacy Suboptimal therapeutic regimen for HLD/PAD  Pharmacist Clinical Goal(s):  Patient will achieve adherence to monitoring guidelines and  medication adherence to achieve therapeutic efficacy adhere to plan to optimize therapeutic regimen for HLD/PAD as evidenced by report of adherence to recommended medication management changes through collaboration with PharmD and provider.   Interventions: 1:1 collaboration with Natasha Nam, MD regarding development and update of comprehensive plan of care as evidenced by provider attestation and co-signature Inter-disciplinary care team collaboration (see longitudinal plan of care) Comprehensive medication review performed; medication list updated in electronic medical record  Hyperlipidemia / PAD (LDL goal < 70) -Not ideally controlled - LDL is above goal in s/o occlusive PAD; pt endorses compliance with lovastatin; she endorses leg pain which could be claudication and/or statin-induced -New occlusive PAD Sept 2022; considering Xarelto 2.5 mg, rosuvastatin w/ cardiologist -Current treatment: Lovastatin 40 mg daily HS Aspirin 81 mg daily -[facility] 2 weeks ago per cardiology recommendations) -Educated on Cholesterol goals; Benefits of statin for ASCVD risk reduction; Strategies to manage statin-induced myalgias; benefits of aspirin in PAD -Counseled to continued daily exercise routine -Recommend to change lovastatin to rosuvastatin 10 mg daily in light of leg pain and to target  LDL < 70  Diabetes (A1c goal <7%) -Controlled - A1c is at goal; pt normally checked BG once a week but has checked daily over past week in preparation for this appt; she endorses compliance with medications as below -Current medications: Metformin 500 mg - 1/2 tab daily Jardiance 10 mg - 1/2 tab daily Testing supplies -Current home glucose readings fasting glucose: 83, 92, 103, 93, 98 -Denies hypoglycemic/hyperglycemic symptoms -Educated on A1c and blood sugar goals; Complications of diabetes including kidney damage, retinal damage, and cardiovascular  disease; benefits of metformin and Jardiance for DM and CV protection -Assessed pt finances - she reports household income is >$28,000 so she will not qualify for LIS, but she should qualify for Jardiance pt assistance. She would like to pursue; forms mailed to patient and she was advised to complete and bring to Dr Natasha Woodard for signature -Recommended to continue current medication; she may resume weekly BG testing  Heart Failure / Hypertension (Goal: BP < 140/90) -Not ideally controlled - [facility]) and reports BP is normally 120s/70s; she has been checking daily before meds for past week in preparation for today's visit and -Current home BP/HR readings: 150/76, 153/83, 156/84, 172/84, 144/78 -Hx frequent PVCs controlled with amiodarone -Last ejection fraction: 60-65% (Date: 12/25/19) -HF type: Diastolic -NYHA Class: I (no actitivty limitation) -Current treatment: Carvedilol 25 mg BID Furosemide 20 mg MWF Entresto 97-103 mg BID Amiodarone 200 mg - 1/2 tab daily Jardiance 10 mg -1/2 tab daily -Educated on Benefits of medications for managing symptoms and prolonging life; Importance of blood pressure control; proper BP monitoring technique -Pt is taking 2 BID medications (Entresto, carvedilol) and she reports very rarely missing PM doses - these PM doses should be controlling BP overnight and into morning, so BP before AM doses should not be significantly higher than other times of day; concern for persistently elevated BP on a daily basis -Recommended to check BP twice daily - after AM meds and before PM meds  Health Maintenance -Vaccine gaps: TDAP, Covid booster -Counseled to get bivalent covid booster at [facility]; discussed benefits/risks of TDAP booster, pt is low risk for TD and would like to forego 10-year booster -Current therapy:  Vitamin D 1000 IU daily Tramadol 50 mg BID prn Tylenol 500 mg  -Patient is satisfied with  current therapy and denies issues -Recommended to continue current medication  Patient Goals/Self-Care Activities Patient will:  - take medications as prescribed -focus on medication adherence by pill box -check blood pressure twice daily (after AM meds and before PM meds) -collaborate with provider on medication access solutions (Bring Jardiance forms to Dr Bensimhon's office)      Natasha Woodard was given information about Chronic Care Management services today including:  CCM service includes personalized support from designated clinical staff supervised by her physician, including individualized plan of care and coordination with other care providers 24/7 contact phone numbers for assistance for urgent and routine care needs. Standard insurance, coinsurance, copays and deductibles apply for chronic care management only during months in which we provide at least 20 minutes of these services. Most insurances cover these services at 100%, however patients may be responsible for any copay, coinsurance and/or deductible if applicable. This service may help you avoid the need for more expensive face-to-face services. Only one practitioner may furnish and bill the service in a calendar month. The patient may stop CCM services at any time (effective at the end  of the month) by phone call to the office staff.  Patient agreed to services and verbal consent obtained.   Patient verbalizes understanding of instructions provided today and agrees to view in MyChart.  Telephone follow up appointment with pharmacy team member scheduled for: 1 month  Natasha Woodard, PharmD, [place], CPP Clinical Pharmacist [facility] [phone]

## 2021-09-29 NOTE — Progress Notes (Signed)
Chronic Care Management Pharmacy Note  09/29/2021 Name:  Natasha Woodard MRN:  [id number] DOB:  1941-09-11  Summary: -BP at home (checked before meds in AM) has been high the past week (150/76, 153/83, 156/84, 172/84, 144/78), pt is adamant that when she checks it after medications it is usually < 130/80 -Lovastatin may be contributing to leg pain; Dr Ellyn Hack noted in recent visit to consider changing to rosuvastatin as well, in light of PAD -Natasha Woodard is very expensive; pt may qualify for pt assistance and reports income is too high for LIS  Recommendations/Changes made from today's visit: -Recommend to change lovastatin to rosuvastatin 10 mg daily in light of leg pain and to target LDL < 70 -Pt to check BP twice daily (after meds in AM, before meds at night) for 2 weeks -Pursue Jardiance pt assistance - mailed forms to patient, she will bring to Dr Haroldine Laws for signature -Advised bivalent Covid booster  Plan: -CMA call in 2 weeks for BP readings  Subjective: Natasha Woodard is an 80 y.o. year old female who is a primary patient of Damita Dunnings, Elveria Rising, MD.  The CCM team was consulted for assistance with disease management and care coordination needs.    Engaged with patient by telephone for initial visit in response to provider referral for pharmacy case management and/or care coordination services.   Consent to Services:  The patient was given the following information about Chronic Care Management services today, agreed to services, and gave verbal consent: 1. CCM service includes personalized support from designated clinical staff supervised by the primary care provider, including individualized plan of care and coordination with other care providers 2. 24/7 contact phone numbers for assistance for urgent and routine care needs. 3. Service will only be billed when office clinical staff spend 20 minutes or more in a month to coordinate care. 4. Only one practitioner may furnish and  bill the service in a calendar month. 5.The patient may stop CCM services at any time (effective at the end of the month) by phone call to the office staff. 6. The patient will be responsible for cost sharing (co-pay) of up to 20% of the service fee (after annual deductible is met). Patient agreed to services and consent obtained.  Patient Care Team: Tonia Ghent, MD as PCP - General (Family Medicine) Leonie Man, MD as PCP - Cardiology (Cardiology) Bensimhon, Shaune Pascal, MD as PCP - Advanced Heart Failure (Cardiology) Wellington Hampshire, MD as PCP - [facility] (Cardiology) Hortencia Pilar, MD as Consulting Physician (Ophthalmology) Charlton Haws, [facility] as Pharmacist (Pharmacist)   [facility]  Recent office visits: 08/19/2021 - Natasha Stain, MD - Patient presented for follow up Diabetes. Labs: A1c. Change: metFORMIN (GLUCOPHAGE) 500 MG tablet. Take half tablet by mouth daily with meal (previously twice a day).   04/29/2021 -  Natasha Stain, MD - Patient presented for cough. Start: benzonatate (TESSALON) 200 MG capsule.  04/06/2021 - Natasha Stain, MD - [place] presented for Annual Wellness Visit. Stop: omeprazole (PRILOSEC) 20 MG capsule as no longer needed. Labs: CXR and EKG. Start: OTC Zyrtec.   Recent consult visits: 09/14/2021 - Natasha Sacramento, MD - Cardiology - Patient presented for evaluation management of peripheral arterial disease. No medication changes.    09/09/2021 - Natasha Hew, MD - Cardiology - Patient presented for follow up on Hyperlipidemia. Labs. EKG. Referral for Peripheral Vascular Consult. suspect now that we have identified what appears to be occlusive  PAD, would probably want to convert from lovastatin to likely rosuvastatin.  We can address this in follow-up.  09/09/2021 - Natasha Woodard - Patient presented for [facility]. No other information.  04/27/2021 - [facility] - Patient presented for with cough,  chest congestion.  03/31/2021 - Natasha Woodard - Cardiology - Patient presented for congestive heart failure. No medication changes.   Hospital visits: None in previous 6 months   Objective:  Lab Results  Component Value Date   CREATININE 1.13 03/19/2021   BUN 17 03/19/2021   GFR 45.93 (L) 03/19/2021   GFRNONAA 40 (L) 10/10/2020   GFRAA 49 (L) 10/09/2020   NA 142 03/19/2021   K 4.7 03/19/2021   CALCIUM 9.3 03/19/2021   CO2 30 03/19/2021   GLUCOSE 88 03/19/2021    Lab Results  Component Value Date/Time   HGBA1C 6.2 (A) 08/19/2021 11:19 AM   HGBA1C 6.6 (H) 03/19/2021 11:21 AM   HGBA1C 6.0 (A) 12/25/2020 02:33 PM   HGBA1C 6.3 (H) 10/09/2020 09:35 AM   GFR 45.93 (L) 03/19/2021 11:21 AM   GFR 52.41 (L) 11/12/2019 09:21 AM    Last diabetic Eye exam:  Lab Results  Component Value Date/Time   HMDIABEYEEXA No Retinopathy 10/06/2020 12:00 AM   HMDIABEYEEXA No Retinopathy 10/06/2020 12:00 AM    Last diabetic Foot exam:  Lab Results  Component Value Date/Time   HMDIABFOOTEX yes 01/25/2011 12:00 AM     Lab Results  Component Value Date   CHOL 141 03/19/2021   HDL 42.10 03/19/2021   LDLCALC 81 03/19/2021   LDLDIRECT 53 08/10/2009   TRIG 89.0 03/19/2021   CHOLHDL 3 03/19/2021    Hepatic Function Latest Ref Rng & Units 03/19/2021 10/10/2020 10/09/2020  Total Protein 6.0 - 8.3 g/dL 6.5 6.1(L) 6.5  Albumin 3.5 - 5.2 g/dL 3.9 3.5 4.1  AST 0 - 37 U/L 12 19 12   ALT 0 - 35 U/L 9 6 7   Alk Phosphatase 39 - 117 U/L 56 58 65  Total Bilirubin 0.2 - 1.2 mg/dL 0.5 0.7 0.3  Bilirubin, Direct 0.00 - 0.40 mg/dL - - -    Lab Results  Component Value Date/Time   TSH 1.23 03/19/2021 11:21 AM   TSH 2.148 04/17/2020 03:08 PM   TSH 1.87 11/12/2019 09:21 AM   FREET4 1.32 (H) 04/17/2020 03:08 PM   FREET4 1.28 10/08/2018 11:14 AM    CBC Latest Ref Rng & Units 03/19/2021 10/09/2020 10/10/2019  WBC 4.0 - 10.5 K/uL 4.3 3.9 3.7(L)  Hemoglobin 12.0 - 15.0 g/dL 13.6 14.2 12.9  Hematocrit  36.0 - 46.0 % 41.8 44.9 41.3  Platelets 150.0 - 400.0 K/uL 333.0 379 302    Lab Results  Component Value Date/Time   VD25OH 60 07/23/2013 10:13 AM    Clinical ASCVD: Yes  - PAD The ASCVD Risk score (Arnett DK, et al., 2019) failed to calculate for the following reasons:   The 2019 ASCVD risk score is only valid for ages 26 to 40    Depression screen PHQ 2/9 04/08/2021 11/12/2019 11/15/2018  Decreased Interest 0 0 0  Down, Depressed, Hopeless 0 0 0  PHQ - 2 Score 0 0 0  Altered sleeping 0 0 -  Tired, decreased energy 0 0 -  Change in appetite 0 0 -  Feeling bad or failure about yourself  0 0 -  Trouble concentrating 0 0 -  Moving slowly or fidgety/restless 0 0 -  Suicidal thoughts 0 0 -  PHQ-9  Score 0 0 -  Difficult doing work/chores Not difficult at all Not difficult at all -  Some recent data might be hidden     Social History   Tobacco Use  Smoking Status Former   Years: 28.00   Types: Cigarettes   Quit date: 12/12/1993   Years since quitting: 27.8  Smokeless Tobacco Never   BP Readings from Last 3 Encounters:  09/14/21 (!) 148/85  09/09/21 (!) 162/88  08/19/21 130/70   Pulse Readings from Last 3 Encounters:  09/14/21 68  09/09/21 (!) 58  08/19/21 (!) 55   Wt Readings from Last 3 Encounters:  09/14/21 172 lb 9.6 oz (78.3 kg)  09/09/21 171 lb 12.8 oz (77.9 kg)  08/19/21 172 lb (78 kg)   BMI Readings from Last 3 Encounters:  09/14/21 31.57 kg/m  09/09/21 31.42 kg/m  08/19/21 31.46 kg/m    Assessment/Interventions: Review of patient past medical history, allergies, medications, health status, including review of consultants reports, laboratory and other test data, was performed as part of comprehensive evaluation and provision of chronic care management services.   SDOH:  (Social Determinants of Health) assessments and interventions performed: Yes  SDOH Screenings   Alcohol Screen: Not on file  Depression (PHQ2-9): Low Risk    PHQ-2 Score: 0   Financial Resource Strain: Not on file  Food Insecurity: Not on file  Housing: Not on file  Physical Activity: Not on file  Social Connections: Not on file  Stress: Not on file  Tobacco Use: Medium Risk   Smoking Tobacco Use: Former   Smokeless Tobacco Use: Never  Transportation Needs: Not on file    Margaret  Allergies  Allergen Reactions   Ezetimibe-Simvastatin Other (See Comments)    REACTION: Muscle aches (side effect)   Lipitor [Atorvastatin] Other (See Comments)    Leg weakness    Tamiflu [Oseltamivir Phosphate] Other (See Comments)    nightmares   Pravastatin Sodium Other (See Comments)    REACTION: Muscle aches (side effect)   Sulfonamide Derivatives Nausea And Vomiting    Medications Reviewed Today     Reviewed by Charlton Haws, [facility] (Pharmacist) on 09/29/21 at 1241  Med List Status: <None>   Medication Order Taking? Sig Documenting Provider Last Dose Status Informant  acetaminophen (TYLENOL) 500 MG tablet [id number] Yes Take 500 mg by mouth every 6 (six) hours as needed. [provider] Taking Active   amiodarone (PACERONE) 200 MG tablet [id number] Yes TAKE 1/2 TABLET BY MOUTH EVERY DAY Leonie Man, MD Taking Active   aspirin 81 MG tablet [id number] Yes Take 1 tablet (81 mg total) by mouth daily. Tonia Ghent, MD Taking Active   carvedilol (COREG) 25 MG tablet [id number] Yes TAKE 1 TABLET BY MOUTH TWICE DAILY Tonia Ghent, MD Taking Active   Cholecalciferol (VITAMIN D) 1000 UNITS capsule [id number] Yes Take 1,000 Units by mouth daily. [provider] Taking Active Self           Med Note Fleet Contras Sep 14, 2017  6:33 AM)    furosemide (LASIX) 20 MG tablet [id number] Yes TAKE 1 TABLET BY MOUTH EVERY MONDAY, WEDNESDAY, AND FRIDAY Leonie Man, MD Taking Active   glucose blood (ACCU-CHEK AVIVA PLUS) test strip [id number] Yes USE TO TEST BLOOD SUGAR ONCE DAILY FOR DM E11.9 Tonia Ghent, MD Taking Active   JARDIANCE  10 MG TABS tablet [id number] Yes TAKE 1/2 TABLET BY MOUTH DAILY BEFORE BREAKFAST Bensimhon, Quillian Quince  R, MD Taking Active   Lancets (ACCU-CHEK MULTICLIX) lancets [id number] Yes USE ONE LANCET TO TEST BLOOD GLUCOSE DAILY FOR DM 250.00 Tonia Ghent, MD Taking Active   lovastatin (MEVACOR) 40 MG tablet [id number] Yes TAKE 1 TABLET BY MOUTH AT BEDTIME Tonia Ghent, MD Taking Active   metFORMIN (GLUCOPHAGE) 500 MG tablet [id number] Yes TAKE 1/2 TABLET BY MOUTH TWICE DAILY WITH A MEAL  Patient taking differently: TAKE 1/2 TABLET BY MOUTH DAILY WITH A MEAL   Tonia Ghent, MD Taking Active   sacubitril-valsartan (ENTRESTO) 97-103 MG [id number] Yes Take 1 tablet by mouth 2 (two) times daily. Bensimhon, Shaune Pascal, MD Taking Active   traMADol (ULTRAM) 50 MG tablet [id number] Yes TAKE 1 TABLET(50 MG) BY MOUTH EVERY 12 HOURS Tonia Ghent, MD Taking Active             Patient Active Problem List   Diagnosis Date Noted   Intermittent claudication of both lower extremities due to atherosclerosis ([place]) 08/22/2021   Ganglion cyst of wrist 08/22/2021   On amiodarone therapy 08/21/2020   Hyperkalemia 08/21/2020   CKD (chronic kidney disease) stage 3, GFR 30-59 ml/min (HCC) 09/25/2019   Gastroesophageal reflux disease 06/19/2019   Abdominal pain 02/06/2019   Health care maintenance 11/14/2018   Joint pain 08/07/2018   Cough 06/17/2018   Sensorineural hearing loss (SNHL), bilateral 03/20/2018   Complex sleep apnea syndrome 10/05/2017   Chronic heart failure with preserved ejection fraction (HFpEF) ([place]) 09/08/2017   Dilated cardiomyopathy ([place]) 06/30/2017   Frequent PVCs 06/30/2017   Advance care planning 08/19/2014   Medicare annual wellness visit, subsequent 07/30/2013   KNEE PAIN, LEFT 11/09/2010   Type II diabetes mellitus with peripheral artery disease ([place]) 05/19/2010   HLD (hyperlipidemia) 05/19/2010   Essential hypertension 05/19/2010   EMPHYSEMA 05/19/2010   HIATAL HERNIA WITH REFLUX  05/19/2010   Ichthyosis 05/19/2010    Immunization History  Administered Date(s) Administered   Fluad Quad(high Dose 65+) 08/19/2021   Influenza Whole 08/12/2010   Influenza,inj,Quad PF,6+ Mos 08/19/2014, 08/15/2015, 09/02/2016, 08/05/2019   Influenza-Unspecified 09/11/2018   PFIZER Comirnaty(Gray Top)Covid-19 Tri-Sucrose Vaccine 03/23/2021   PFIZER(Purple Top)SARS-COV-2 Vaccination 02/09/2020, 02/29/2020, 09/15/2020   Pneumococcal Conjugate-13 03/10/2015   Pneumococcal Polysaccharide-23 05/26/2006   Td 06/14/2007   Zoster Recombinat (Shingrix) 08/12/2020, 10/12/2020   Zoster, Live 08/10/2006    Conditions to be addressed/monitored:  Hypertension, Hyperlipidemia, Diabetes, Heart Failure, and Chronic Kidney Disease, PAD  Care Plan : [place]  Updates made by Charlton Haws, [place] since 09/29/2021 12:00 AM     Problem: Hypertension, Hyperlipidemia, Diabetes, Heart Failure, and Chronic Kidney Disease, PAD   Priority: High     Long-Range Goal: Disease management   Start Date: 09/29/2021  Expected End Date: 09/29/2022  This Visit's Progress: On track  Priority: High  Note:    Current Barriers:  Unable to independently monitor therapeutic efficacy Suboptimal therapeutic regimen for HLD/PAD  Pharmacist Clinical Goal(s):  Patient will achieve adherence to monitoring guidelines and medication adherence to achieve therapeutic efficacy adhere to plan to optimize therapeutic regimen for HLD/PAD as evidenced by report of adherence to recommended medication management changes through collaboration with PharmD and provider.   Interventions: 1:1 collaboration with Tonia Ghent, MD regarding development and update of comprehensive plan of care as evidenced by provider attestation and co-signature Inter-disciplinary care team collaboration (see longitudinal plan of care) Comprehensive medication review performed; medication list updated in electronic medical  record  Hyperlipidemia / PAD (LDL  goal < 70) -Not ideally controlled - LDL is above goal in s/o occlusive PAD; pt endorses compliance with lovastatin; she endorses leg pain which could be claudication and/or statin-induced -New occlusive PAD Sept 2022; considering Xarelto 2.5 mg, rosuvastatin w/ cardiologist -Current treatment: Lovastatin 40 mg daily HS Aspirin 81 mg daily -[facility] per cardiology recommendations) -Educated on Cholesterol goals; Benefits of statin for ASCVD risk reduction; Strategies to manage statin-induced myalgias; benefits of aspirin in PAD -Counseled to continued daily exercise routine -Recommend to change lovastatin to rosuvastatin 10 mg daily in light of leg pain and to target LDL < 70  Diabetes (A1c goal <7%) -Controlled - A1c is at goal; pt normally checked BG once a week but has checked daily over past week in preparation for this appt; she endorses compliance with medications as below -Current medications: Metformin 500 mg - 1/2 tab daily Jardiance 10 mg - 1/2 tab daily Testing supplies -Current home glucose readings fasting glucose: 83, 92, 103, 93, 98 -Denies hypoglycemic/hyperglycemic symptoms -Educated on A1c and blood sugar goals; Complications of diabetes including kidney damage, retinal damage, and cardiovascular disease; benefits of metformin and Jardiance for DM and CV protection -Assessed pt finances - she reports household income is >$28,000 so she will not qualify for LIS, but she should qualify for Jardiance pt assistance. She would like to pursue; forms mailed to patient and she was advised to complete and bring to Dr Haroldine Laws for signature -Recommended to continue current medication; she may resume weekly BG testing  Heart Failure / Hypertension (Goal: BP < 140/90) -Not ideally controlled - [facility] medications) and reports BP is  normally 120s/70s; she has been checking daily before meds for past week in preparation for today's visit and -Current home BP/HR readings: 150/76, 153/83, 156/84, 172/84, 144/78 -Hx frequent PVCs controlled with amiodarone -Last ejection fraction: 60-65% (Date: 12/25/19) -HF type: Diastolic -NYHA Class: I (no actitivty limitation) -Current treatment: Carvedilol 25 mg BID Furosemide 20 mg MWF Entresto 97-103 mg BID Amiodarone 200 mg - 1/2 tab daily Jardiance 10 mg -1/2 tab daily -Educated on Benefits of medications for managing symptoms and prolonging life; Importance of blood pressure control; proper BP monitoring technique -Pt is taking 2 BID medications (Entresto, carvedilol) and she reports very rarely missing PM doses - these PM doses should be controlling BP overnight and into morning, so BP before AM doses should not be significantly higher than other times of day; concern for persistently elevated BP on a daily basis -Recommended to check BP twice daily - after AM meds and before PM meds  Health Maintenance -Vaccine gaps: TDAP, Covid booster -Counseled to get [facility] booster [facility]; discussed benefits/risks of TDAP booster, pt is low risk for TD and would like to forego 10-year booster -Current therapy:  Vitamin D 1000 IU daily Tramadol 50 mg BID prn Tylenol 500 mg  -Patient is satisfied with current therapy and denies issues -Recommended to continue current medication  Patient Goals/Self-Care Activities Patient will:  - take medications as prescribed -focus on medication adherence by pill box -check blood pressure twice daily (after AM meds and before PM meds) -collaborate with provider on medication access solutions (Bring Jardiance forms to Dr Bensimhon's office)      Medication Assistance:  -Delene Loll - [employer] PAP approved through cardiology until 12/11/21 -Jardiance - [employer] PAP in process; forms  mailed to patient; she will bring completed forms to Dr  Bensimhon's office for signature (expected approval date 10/30/21)   Compliance/Adherence/Medication fill history: Care Gaps: None  Star-Rating Drugs: Metformin 551m                                08/09/2021      90                                 Lovastatin 443m                                09/04/2021      90                                 Jardiance 1076m                                09/17/2021      30  Patient's preferred pharmacy is:  [facility] [address]one: [phone]: [phone] [address] [place] [place]one: [phone]: [phone] pill box? Yes - 30 day pill box Pt endorses 100% compliance  We discussed: Current pharmacy is preferred with insurance plan and patient is satisfied with pharmacy services Patient decided to: Continue current medication management strategy  Care Plan and Follow Up Patient Decision:  Patient agrees to Care Plan and Follow-up.  Plan: Telephone follow up appointment with care management team member scheduled for:  1 month  LinCharlene BrookeharmD, BCAGenevaPP Clinical Pharmacist [facility] [phone]

## 2021-09-29 NOTE — Progress Notes (Addendum)
PAP forms for Jardiance filled out and mailed to the patient to take to the specialist for signing and faxing to manufacturer.  Al Corpus RPH,CPP notified  Burt Knack, [facility] Clincal Pharmacy Assistant [phone]  Total time spent for month CPA: 10 min.

## 2021-10-01 ENCOUNTER — Telehealth: Payer: Self-pay | Admitting: Family Medicine

## 2021-10-01 NOTE — Telephone Encounter (Signed)
LMTCB

## 2021-10-01 NOTE — Telephone Encounter (Signed)
Please call pt.  I saw prev notes from Dr. Herbie Baltimore about changes lovastatin to rosuvastatin 10 mg daily to get her cholesterol lower.  I think this makes sense.  If she agrees to the change, let me know.  If she notices troubles on rosuvastatin, then let me know.

## 2021-10-05 NOTE — Telephone Encounter (Signed)
Pt notified as instructed and pt voiced understanding. Pt is in agreement to change to rosuvastatin 10 mg and will cb and let Dr Para March know if any troubles. Pt will ck with [facility] [address] on 10/06/21 to verify rosuvastatin is ready to pick up. Pt will stop lovastatin when starts the rosuvastatin. Sending note to Dr Para March.

## 2021-10-06 MED ORDER — ROSUVASTATIN CALCIUM 10 MG PO TABS: 10.0000 mg | ORAL_TABLET | Freq: Every day | ORAL | 3 refills | Status: DC

## 2021-10-06 NOTE — Addendum Note (Signed)
Addended by: [name] on: 10/06/2021 01:57 PM   Modules accepted: Orders

## 2021-10-06 NOTE — Telephone Encounter (Signed)
Noted. Thanks.  Rx sent.  ?

## 2021-10-08 ENCOUNTER — Telehealth: Payer: Self-pay | Admitting: *Deleted

## 2021-10-08 NOTE — Telephone Encounter (Signed)
Patient wanted office to fax completed patient assistance to [employer]    PAF faxed.

## 2021-10-11 ENCOUNTER — Telehealth: Payer: Self-pay | Admitting: Family Medicine

## 2021-10-11 DIAGNOSIS — I5032 Chronic diastolic (congestive) heart failure: Secondary | ICD-10-CM | POA: Diagnosis not present

## 2021-10-11 DIAGNOSIS — E1151 Type 2 diabetes mellitus with diabetic peripheral angiopathy without gangrene: Secondary | ICD-10-CM | POA: Diagnosis not present

## 2021-10-11 DIAGNOSIS — E782 Mixed hyperlipidemia: Secondary | ICD-10-CM

## 2021-10-11 DIAGNOSIS — I10 Essential (primary) hypertension: Secondary | ICD-10-CM | POA: Diagnosis not present

## 2021-10-11 MED ORDER — LORATADINE 10 MG PO TABS: 10.0000 mg | ORAL_TABLET | Freq: Every day | ORAL | Status: DC

## 2021-10-11 NOTE — Telephone Encounter (Signed)
Please advise 

## 2021-10-11 NOTE — Telephone Encounter (Signed)
Pt called stated she would like to have something called in for hives . Would like a call back or leave detailed message . [phone]

## 2021-10-11 NOTE — Telephone Encounter (Signed)
Patient notified as instructed by telephone and verbalized understanding. Patient stated that the hives started about a week ago on her neck, ears, ankles and knees.  Patient stated that she had a problem with hives years ago and the doctor that she was seeing gave her a prescription that started with an A for the hives and itching.  Patient stated that she does not have any idea what caused the hives. Patient was offered an appointment tomorrow which she declined. Patient stated that she will start taking the Claritin and if she does not improve she will call back for an appointment. Patient was given ER precautions and she verbalized understanding. Patient denies itching, SOB or difficulty breathing. Pharmacy [facility]

## 2021-10-11 NOTE — Telephone Encounter (Signed)
Would start with plain claritin, not clartitin D.   But please triage patient about what she has going on in the first place.  See about any other symptoms and any possible triggers.  Thanks.

## 2021-10-11 NOTE — Telephone Encounter (Signed)
She could be talking about atarax but I would still try clartin in the meantime.  Thanks.

## 2021-10-12 DIAGNOSIS — H2513 Age-related nuclear cataract, bilateral: Secondary | ICD-10-CM | POA: Diagnosis not present

## 2021-10-12 DIAGNOSIS — H524 Presbyopia: Secondary | ICD-10-CM | POA: Diagnosis not present

## 2021-10-12 DIAGNOSIS — H40023 Open angle with borderline findings, high risk, bilateral: Secondary | ICD-10-CM | POA: Diagnosis not present

## 2021-10-12 DIAGNOSIS — H25013 Cortical age-related cataract, bilateral: Secondary | ICD-10-CM | POA: Diagnosis not present

## 2021-10-12 DIAGNOSIS — E119 Type 2 diabetes mellitus without complications: Secondary | ICD-10-CM | POA: Diagnosis not present

## 2021-10-15 ENCOUNTER — Telehealth: Payer: Self-pay

## 2021-10-15 ENCOUNTER — Ambulatory Visit: Payer: Medicare Other | Admitting: Podiatry

## 2021-10-15 NOTE — Progress Notes (Signed)
    Chronic Care Management Pharmacy Assistant   Name: Natasha Woodard  MRN: [id number] DOB: Mar 25, 1941  Reason for Encounter: Blood pressure readings for past two weeks  Date:  AM Reading:   PM Reading:      11/04  157/83 11/03  135/68   159/85 11/02  120/72   130/74 11/01  --------   --------- 10/31  --------   --------- 10/30  134/74   136/74   10/29  120/68   139/77  160/74 10/28  146/75   135/65 10/27  --------   --------- 10/26  114/76   142/79 10/25  136/70   158/83 10/24  --------   156/71 10/23  112/66   141/75 10/22  135/84   -------- 10/21  172/91   111/65  Patient has been feeling really well the past two weeks; she has hives that she talked with Dr. Para March about which are going away with Claritin. Patient was seeing double when she watches tv and she went to the eye doctor; advised to use eye drops several times a day which is helping. Patient wants to start taking (next week) Osteo Bi-Filex Extra Strength for her arthritis; pharmacist from [facility] told her to take it. She is also taking a medication for her arthritis that her friend told her to take; patient does not know the name of the medication and she threw the box away (states it is a long, oval, orange color capsule - does not have any numbers or letters on it). She has been taking it for  about 9 days now. I advised patient to not take any medication unless she asks her provide first.   Time Spent: 22 Minutes

## 2021-10-20 ENCOUNTER — Ambulatory Visit: Payer: Self-pay | Admitting: Pharmacist

## 2021-10-20 DIAGNOSIS — I5032 Chronic diastolic (congestive) heart failure: Secondary | ICD-10-CM

## 2021-10-20 DIAGNOSIS — I10 Essential (primary) hypertension: Secondary | ICD-10-CM

## 2021-10-20 NOTE — Progress Notes (Signed)
Updated BP in chart with patient-reported home BP:  BP Readings from Last 3 Encounters:  10/14/21 135/68  09/14/21 (!) 148/85  09/09/21 (!) 162/88     Care Plan : CCM Pharmacy Care Plan  Updates made by [name], RPH since 10/20/2021 12:00 AM     Problem: Hypertension, Hyperlipidemia, Diabetes, Heart Failure, and Chronic Kidney Disease, PAD   Priority: High     Long-Range Goal: Disease management   Start Date: 09/29/2021  Expected End Date: 09/29/2022  Recent Progress: On track  Priority: High  Note:    Current Barriers:  Unable to independently monitor therapeutic efficacy Suboptimal therapeutic regimen for HLD/PAD  Pharmacist Clinical Goal(s):  Patient will achieve adherence to monitoring guidelines and medication adherence to achieve therapeutic efficacy adhere to plan to optimize therapeutic regimen for HLD/PAD as evidenced by report of adherence to recommended medication management changes through collaboration with PharmD and provider.   Interventions: 1:1 collaboration with [name], MD regarding development and update of comprehensive plan of care as evidenced by provider attestation and co-signature Inter-disciplinary care team collaboration (see longitudinal plan of care) Comprehensive medication review performed; medication list updated in electronic medical record  Hyperlipidemia / PAD (LDL goal < 70) -Not ideally controlled - LDL is above goal in s/o occlusive PAD; pt endorses compliance with lovastatin; she endorses leg pain which could be claudication and/or statin-induced -New occlusive PAD Sept 2022; considering Xarelto 2.5 mg, rosuvastatin w/ cardiologist -Current treatment: Lovastatin 40 mg daily HS Aspirin 81 mg daily -[facility] 2 weeks ago per cardiology recommendations) -Educated on Cholesterol goals; Benefits of statin for ASCVD risk reduction; Strategies to manage  statin-induced myalgias; benefits of aspirin in PAD -Counseled to continued daily exercise routine -Recommend to change lovastatin to rosuvastatin 10 mg daily in light of leg pain and to target LDL < 70  Diabetes (A1c goal <7%) -Controlled - A1c is at goal; pt normally checked BG once a week but has checked daily over past week in preparation for this appt; she endorses compliance with medications as below -Current medications: Metformin 500 mg - 1/2 tab daily Jardiance 10 mg - 1/2 tab daily Testing supplies -Current home glucose readings fasting glucose: 83, 92, 103, 93, 98 -Denies hypoglycemic/hyperglycemic symptoms -Educated on A1c and blood sugar goals; Complications of diabetes including kidney damage, retinal damage, and cardiovascular disease; benefits of metformin and Jardiance for DM and CV protection -Assessed pt finances - she reports household income is >$28,000 so she will not qualify for LIS, but she should qualify for Jardiance pt assistance. She would like to pursue; forms mailed to patient and she was advised to complete and bring to [name] for signature -Recommended to continue current medication; she may resume weekly BG testing  Heart Failure / Hypertension (Goal: BP < 140/90) -Not ideally controlled - [facility]) and reports BP is normally 120s/70s; she has been checking daily before meds for past week in preparation for today's visit and -Current home BP/HR readings: 10/14/21: 135/68 > updated EMR 150/76, 153/83, 156/84, 172/84, 144/78 -Hx frequent PVCs controlled with amiodarone -Last ejection fraction: 60-65% (Date: 12/25/19) -HF type: Diastolic -NYHA Class: I (no actitivty limitation) -Current treatment: Carvedilol 25 mg BID Furosemide 20 mg MWF Entresto 97-103 mg BID Amiodarone 200 mg - 1/2 tab daily Jardiance 10 mg -1/2 tab daily -Educated on Benefits of medications for managing  symptoms and  prolonging life; Importance of blood pressure control; proper BP monitoring technique -Pt is taking 2 BID medications (Entresto, carvedilol) and she reports very rarely missing PM doses - these PM doses should be controlling BP overnight and into morning, so BP before AM doses should not be significantly higher than other times of day; concern for persistently elevated BP on a daily basis -Recommended to check BP twice daily - after AM meds and before PM meds  Health Maintenance -Vaccine gaps: TDAP, Covid booster -Counseled to get bivalent covid booster at [facility]; discussed benefits/risks of TDAP booster, pt is low risk for TD and would like to forego 10-year booster -Current therapy:  Vitamin D 1000 IU daily Tramadol 50 mg BID prn Tylenol 500 mg  -Patient is satisfied with current therapy and denies issues -Recommended to continue current medication  Patient Goals/Self-Care Activities Patient will:  - take medications as prescribed -focus on medication adherence by pill box -check blood pressure twice daily (after AM meds and before PM meds) -collaborate with provider on medication access solutions (Bring Jardiance forms to Dr Bensimhon's office)    Al Corpus, PharmD, BCACP Clinical Pharmacist [facility] [phone]

## 2021-10-29 NOTE — Telephone Encounter (Signed)
Patient states the form was filled out with Dr. Prescott Gum name on it as her provider, but she has not been a patient of his since April. She says his name was scratched out and Dr. Elissa Hefty name was put in, but they did not initial it. She says there were other instances where they should have initialed it and states there were several things that were omitted. She says she will also need to initial it for proof of income and can come by Monday.

## 2021-11-01 NOTE — Telephone Encounter (Signed)
Spoke to patient informed her she could by the office 11/02/21. She verbalized understanding.

## 2021-11-02 ENCOUNTER — Telehealth: Payer: Self-pay | Admitting: Cardiology

## 2021-11-02 ENCOUNTER — Encounter: Payer: Self-pay | Admitting: Cardiology

## 2021-11-02 NOTE — Telephone Encounter (Signed)
Spoke to patient she will bring papers by on Monday 11/08/21

## 2021-11-02 NOTE — Telephone Encounter (Signed)
Patient was suppose to bring by form but wasn't able to come by today to [facility] by noon.  She want's to know if she can bring it by tomorrow, whatever time Natasha Woodard tells her.

## 2021-11-08 NOTE — Telephone Encounter (Signed)
See  the other  telephone note  Spoke to patient . She came to the office of 11/08/21

## 2021-11-15 ENCOUNTER — Telehealth: Payer: Self-pay

## 2021-11-15 NOTE — Progress Notes (Signed)
    Chronic Care Management Pharmacy Assistant   Name: Natasha Woodard  MRN: [id number] DOB: 02/20/1941  Reason for Encounter: CCM (Appointment Reminder)   Medications: Outpatient Encounter Medications as of 11/15/2021  Medication Sig   acetaminophen (TYLENOL) 500 MG tablet Take 500 mg by mouth every 6 (six) hours as needed.   amiodarone (PACERONE) 200 MG tablet TAKE 1/2 TABLET BY MOUTH EVERY DAY   aspirin 81 MG tablet Take 1 tablet (81 mg total) by mouth daily.   carvedilol (COREG) 25 MG tablet TAKE 1 TABLET BY MOUTH TWICE DAILY   Cholecalciferol (VITAMIN D) 1000 UNITS capsule Take 1,000 Units by mouth daily.   furosemide (LASIX) 20 MG tablet TAKE 1 TABLET BY MOUTH EVERY MONDAY, WEDNESDAY, AND FRIDAY   glucose blood (ACCU-CHEK AVIVA PLUS) test strip USE TO TEST BLOOD SUGAR ONCE DAILY FOR DM E11.9   JARDIANCE 10 MG TABS tablet TAKE 1/2 TABLET BY MOUTH DAILY BEFORE BREAKFAST   Lancets (ACCU-CHEK MULTICLIX) lancets USE ONE LANCET TO TEST BLOOD GLUCOSE DAILY FOR DM 250.00   loratadine (CLARITIN) 10 MG tablet Take 1 tablet (10 mg total) by mouth daily.   metFORMIN (GLUCOPHAGE) 500 MG tablet TAKE 1/2 TABLET BY MOUTH TWICE DAILY WITH A MEAL (Patient taking differently: TAKE 1/2 TABLET BY MOUTH DAILY WITH A MEAL)   rosuvastatin (CRESTOR) 10 MG tablet Take 1 tablet (10 mg total) by mouth daily.   sacubitril-valsartan (ENTRESTO) 97-103 MG Take 1 tablet by mouth 2 (two) times daily.   traMADol (ULTRAM) 50 MG tablet TAKE 1 TABLET(50 MG) BY MOUTH EVERY 12 HOURS   No facility-administered encounter medications on file as of 11/15/2021.   Natasha Woodard was contacted to remind her of her upcoming telephone visit with Al Corpus on 11/19/2021 at 1:45. Patient was reminded to have all medications, supplements and any blood glucose and blood pressure readings available for review at appointment.  Are you having any problems with your medications? No  Do you have any concerns you like to  discuss with the pharmacist? Yes - Patient states she has some questions about her diabetes medication. Patient states she is on 1 Metformin twice a day. Patient can tell a difference in her weight loss going down as in patient is gaining weight. Patient is taking Bio Flex OTC for Arthritis and would like to know how that interacts with her other medications.   Patient states she takes her blood pressure and blood sugar readings at home. Patient will take them every day starting today up until the phone call. Patient will reports readings to [facility].   Star Rating Drugs: Medication:    Last Fill: Day Supply Rosuvastatin 10 mg   10/06/2021 90 Metformin 500 mg   08/09/2021 90 Sacubitril-Valsartan 97-103 mg 01/06/2021 90 Jardiance 10 mg   10/08/2021 30 Fill dates verified with [facility]  Al Corpus, CPP notified  Claudina Lick, [place] Clinical Pharmacy Assistant [phone]  Time Spent:  10 Minutes

## 2021-11-16 ENCOUNTER — Encounter: Payer: Self-pay | Admitting: Podiatry

## 2021-11-16 ENCOUNTER — Other Ambulatory Visit: Payer: Self-pay

## 2021-11-16 ENCOUNTER — Ambulatory Visit: Payer: Medicare Other | Admitting: Podiatry

## 2021-11-16 DIAGNOSIS — M79675 Pain in left toe(s): Secondary | ICD-10-CM | POA: Diagnosis not present

## 2021-11-16 DIAGNOSIS — E1169 Type 2 diabetes mellitus with other specified complication: Secondary | ICD-10-CM

## 2021-11-16 DIAGNOSIS — E1151 Type 2 diabetes mellitus with diabetic peripheral angiopathy without gangrene: Secondary | ICD-10-CM | POA: Insufficient documentation

## 2021-11-16 DIAGNOSIS — B351 Tinea unguium: Secondary | ICD-10-CM

## 2021-11-16 NOTE — Progress Notes (Signed)
This patient presents to the office with chief complaint of long thick nails and diabetic feet.  This patient  says there  is  no pain and discomfort in her feet.  This patient says there are long thick painful nails.  These nails are painful walking and wearing shoes.  Patient has no history of infection or drainage from both feet.  Patient is unable to  self treat his own nails . This patient presents  to the office today for treatment of the  long nails and a foot evaluation due to history of  diabetes. Patient has previously been diagnosed with intermittant claudication, CKD, Type 2 diabetes with  PAD.  General Appearance  Alert, conversant and in no acute stress.  Vascular  Dorsalis pedis and posterior tibial  pulses are absent  bilaterally.  Capillary return is within normal limits  bilaterally. Cold feet.  Absent digital hair. bilaterally.  Neurologic  Senn-Weinstein monofilament wire test within normal limits  bilaterally. Muscle power within normal limits bilaterally.  Nails Thick disfigured discolored nails with subungual debris  1-5 left foot and 2-5 right foot.  . No evidence of bacterial infection or drainage bilaterally.  Orthopedic  No limitations of motion of motion feet .  No crepitus or effusions noted.  No bony pathology or digital deformities noted.  Skin  Icthyotic skin with no porokeratosis noted bilaterally.  No signs of infections or ulcers noted.     Onychomycosis  Diabetes with no foot complications  IE  Debride nails x 10.  A diabetic foot exam was performed and there is no evidence of any   neurologic pathology.  Vascular pathology feet  B/l. RTC 3 months.   Helane Gunther DPM

## 2021-11-19 ENCOUNTER — Telehealth: Payer: Medicare Other

## 2021-11-19 ENCOUNTER — Other Ambulatory Visit: Payer: Self-pay

## 2021-11-19 ENCOUNTER — Ambulatory Visit (INDEPENDENT_AMBULATORY_CARE_PROVIDER_SITE_OTHER): Payer: Medicare Other | Admitting: Pharmacist

## 2021-11-19 DIAGNOSIS — E782 Mixed hyperlipidemia: Secondary | ICD-10-CM

## 2021-11-19 DIAGNOSIS — E1151 Type 2 diabetes mellitus with diabetic peripheral angiopathy without gangrene: Secondary | ICD-10-CM

## 2021-11-19 DIAGNOSIS — I10 Essential (primary) hypertension: Secondary | ICD-10-CM

## 2021-11-19 DIAGNOSIS — I5032 Chronic diastolic (congestive) heart failure: Secondary | ICD-10-CM

## 2021-11-19 NOTE — Progress Notes (Signed)
Chronic Care Management Pharmacy Note  11/24/2021 Name:  Natasha Woodard MRN:  [id number] DOB:  08/04/1941  Summary: -BP at home (checked before meds in AM) has been high the past week (154/82-174/97); she denies headaches, dizziness, confusion; discussed dangers of high BP at length -Pt endorses compliance with new rosuvastatin 10 mg, she has stopped lovastatin  Recommendations/Changes made from today's visit: -Recommended adding hydralazine 25 mg TID - pt declined as she is very reluctant to add more meds, she wants to work on diet changes/wt loss to improve BP. I advised office visit to see PCP which she also declined due to distance to [place]. -Pt to check BP twice daily (after meds in AM, before meds at night) for 2 weeks  Plan: -[place] will call patient in 2 weeks for BP readings/med adherence -Pharmacist follow up televisit scheduled for 1 month   Subjective: Natasha Woodard is an 80 y.o. year old female who is a primary patient of Damita Dunnings, Elveria Rising, MD.  The CCM team was consulted for assistance with disease management and care coordination needs.    Engaged with patient by telephone for follow up visit in response to provider referral for pharmacy case management and/or care coordination services.   Consent to Services:  The patient was given information about Chronic Care Management services, agreed to services, and gave verbal consent prior to initiation of services.  Please see initial visit note for detailed documentation.   Patient Care Team: Tonia Ghent, MD as PCP - General (Family Medicine) Leonie Man, MD as PCP - Cardiology (Cardiology) Bensimhon, Shaune Pascal, MD as PCP - Advanced Heart Failure (Cardiology) Wellington Hampshire, MD as PCP - [facility] Cardiology (Cardiology) Hortencia Pilar, MD as Consulting Physician (Ophthalmology) Charlton Haws, [facility] as Pharmacist (Pharmacist)   Recent office visits: 08/19/2021 - Elsie Stain,  MD - Patient presented for follow up Diabetes. Labs: A1c. Change: metFORMIN (GLUCOPHAGE) 500 MG tablet. Take half tablet by mouth daily with meal (previously twice a day).   04/29/2021 -  Elsie Stain, MD - Patient presented for cough. Start: benzonatate (TESSALON) 200 MG capsule.  04/06/2021 - Elsie Stain, MD - [place] presented for Annual Wellness Visit. Stop: omeprazole (PRILOSEC) 20 MG capsule as no longer needed. Labs: CXR and EKG. Start: OTC Zyrtec.   Recent consult visits: 09/14/2021 - Kathlyn [place], MD - Cardiology - Patient presented for evaluation management of peripheral arterial disease. No medication changes.    09/09/2021 - Glenetta Hew, MD - Cardiology - Patient presented for follow up on Hyperlipidemia. Labs. EKG. Referral for Peripheral Vascular Consult. suspect now that we have identified what appears to be occlusive PAD, would probably want to convert from lovastatin to likely rosuvastatin.  We can address this in follow-up.  09/09/2021 - [facility] - Patient presented for [facility]. No other information.  04/27/2021 - [facility] - Patient presented for with cough, chest congestion.  03/31/2021 - Glori Bickers - Cardiology - Patient presented for congestive heart failure. No medication changes.   Hospital visits: None in previous 6 months   Objective:  Lab Results  Component Value Date   CREATININE 1.13 03/19/2021   BUN 17 03/19/2021   GFR 45.93 (L) 03/19/2021   GFRNONAA 40 (L) 10/10/2020   GFRAA 49 (L) 10/09/2020   NA 142 03/19/2021   K 4.7 03/19/2021   CALCIUM 9.3 03/19/2021   CO2 30 03/19/2021   GLUCOSE 88 03/19/2021    Lab  Results  Component Value Date/Time   HGBA1C 6.2 (A) 08/19/2021 11:19 AM   HGBA1C 6.6 (H) 03/19/2021 11:21 AM   HGBA1C 6.0 (A) 12/25/2020 02:33 PM   HGBA1C 6.3 (H) 10/09/2020 09:35 AM   GFR 45.93 (L) 03/19/2021 11:21 AM   GFR 52.41 (L) 11/12/2019 09:21 AM    Last diabetic Eye exam:  Lab Results   Component Value Date/Time   HMDIABEYEEXA No Retinopathy 10/06/2020 12:00 AM   HMDIABEYEEXA No Retinopathy 10/06/2020 12:00 AM    Last diabetic Foot exam:  Lab Results  Component Value Date/Time   HMDIABFOOTEX yes 01/25/2011 12:00 AM     Lab Results  Component Value Date   CHOL 141 03/19/2021   HDL 42.10 03/19/2021   LDLCALC 81 03/19/2021   LDLDIRECT 53 08/10/2009   TRIG 89.0 03/19/2021   CHOLHDL 3 03/19/2021    Hepatic Function Latest Ref Rng & Units 03/19/2021 10/10/2020 10/09/2020  Total Protein 6.0 - 8.3 g/dL 6.5 6.1(L) 6.5  Albumin 3.5 - 5.2 g/dL 3.9 3.5 4.1  AST 0 - 37 U/L 12 19 12   ALT 0 - 35 U/L 9 6 7   Alk Phosphatase 39 - 117 U/L 56 58 65  Total Bilirubin 0.2 - 1.2 mg/dL 0.5 0.7 0.3  Bilirubin, Direct 0.00 - 0.40 mg/dL - - -    Lab Results  Component Value Date/Time   TSH 1.23 03/19/2021 11:21 AM   TSH 2.148 04/17/2020 03:08 PM   TSH 1.87 11/12/2019 09:21 AM   FREET4 1.32 (H) 04/17/2020 03:08 PM   FREET4 1.28 10/08/2018 11:14 AM    CBC Latest Ref Rng & Units 03/19/2021 10/09/2020 10/10/2019  WBC 4.0 - 10.5 K/uL 4.3 3.9 3.7(L)  Hemoglobin 12.0 - 15.0 g/dL 13.6 14.2 12.9  Hematocrit 36.0 - 46.0 % 41.8 44.9 41.3  Platelets 150.0 - 400.0 K/uL 333.0 379 302    Lab Results  Component Value Date/Time   VD25OH 60 07/23/2013 10:13 AM    Clinical ASCVD: Yes  - PAD The ASCVD Risk score (Arnett DK, et al., 2019) failed to calculate for the following reasons:   The 2019 ASCVD risk score is only valid for ages 18 to 71    Depression screen PHQ 2/9 04/08/2021 11/12/2019 11/15/2018  Decreased Interest 0 0 0  Down, Depressed, Hopeless 0 0 0  PHQ - 2 Score 0 0 0  Altered sleeping 0 0 -  Tired, decreased energy 0 0 -  Change in appetite 0 0 -  Feeling bad or failure about yourself  0 0 -  Trouble concentrating 0 0 -  Moving slowly or fidgety/restless 0 0 -  Suicidal thoughts 0 0 -  PHQ-9 Score 0 0 -  Difficult doing work/chores Not difficult at all Not difficult at  all -  Some recent data might be hidden     Social History   Tobacco Use  Smoking Status Former   Years: 28.00   Types: Cigarettes   Quit date: 12/12/1993   Years since quitting: 27.9  Smokeless Tobacco Never   BP Readings from Last 3 Encounters:  10/14/21 135/68  09/14/21 (!) 148/85  09/09/21 (!) 162/88   Pulse Readings from Last 3 Encounters:  09/14/21 68  09/09/21 (!) 58  08/19/21 (!) 55   Wt Readings from Last 3 Encounters:  09/14/21 172 lb 9.6 oz (78.3 kg)  09/09/21 171 lb 12.8 oz (77.9 kg)  08/19/21 172 lb (78 kg)   BMI Readings from Last 3 Encounters:  09/14/21 31.57 kg/m  09/09/21 31.42 kg/m  08/19/21 31.46 kg/m    Assessment/Interventions: Review of patient past medical history, allergies, medications, health status, including review of consultants reports, laboratory and other test data, was performed as part of comprehensive evaluation and provision of chronic care management services.   SDOH:  (Social Determinants of Health) assessments and interventions performed: Yes  SDOH Screenings   Alcohol Screen: Not on file  Depression (PHQ2-9): Low Risk    PHQ-2 Score: 0  Financial Resource Strain: Not on file  Food Insecurity: Not on file  Housing: Not on file  Physical Activity: Not on file  Social Connections: Not on file  Stress: Not on file  Tobacco Use: Medium Risk   Smoking Tobacco Use: Former   Smokeless Tobacco Use: Never   Passive Exposure: Not on file  Transportation Needs: Not on file    [place]  Allergies  Allergen Reactions   Ezetimibe-Simvastatin Other (See Comments)    REACTION: Muscle aches (side effect)   Lipitor [Atorvastatin] Other (See Comments)    Leg weakness    Tamiflu [Oseltamivir Phosphate] Other (See Comments)    nightmares   Pravastatin Sodium Other (See Comments)    REACTION: Muscle aches (side effect)   Sulfonamide Derivatives Nausea And Vomiting    Medications Reviewed Today     Reviewed by Charlton Haws, [facility] (Pharmacist) on 11/19/21 at [place] List Status: <None>   Medication Order Taking? Sig Documenting Provider Last Dose Status Informant  acetaminophen (TYLENOL) 500 MG tablet [id number] Yes Take 500 mg by mouth every 6 (six) hours as needed. [provider] Taking Active   amiodarone (PACERONE) 200 MG tablet [id number] Yes TAKE 1/2 TABLET BY MOUTH EVERY DAY Leonie Man, MD Taking Active   aspirin 81 MG tablet [id number] Yes Take 1 tablet (81 mg total) by mouth daily. Tonia Ghent, MD Taking Active   carvedilol (COREG) 25 MG tablet [id number] Yes TAKE 1 TABLET BY MOUTH TWICE DAILY Tonia Ghent, MD Taking Active   Cholecalciferol (VITAMIN D) 1000 UNITS capsule [id number] Yes Take 1,000 Units by mouth daily. [provider] Taking Active Self           Med Note Fleet Contras Sep 14, 2017  6:33 AM)    furosemide (LASIX) 20 MG tablet [id number] Yes TAKE 1 TABLET BY MOUTH EVERY MONDAY, WEDNESDAY, AND FRIDAY Leonie Man, MD Taking Active   glucose blood (ACCU-CHEK AVIVA PLUS) test strip [id number] Yes USE TO TEST BLOOD SUGAR ONCE DAILY FOR DM E11.9 Tonia Ghent, MD Taking Active   JARDIANCE 10 MG TABS tablet [id number] Yes TAKE 1/2 TABLET BY MOUTH DAILY BEFORE BREAKFAST Bensimhon, Shaune Pascal, MD Taking Active   Lancets (ACCU-CHEK MULTICLIX) lancets [id number] Yes USE ONE LANCET TO TEST BLOOD GLUCOSE DAILY FOR DM 250.00 Tonia Ghent, MD Taking Active   loratadine (CLARITIN) 10 MG tablet [id number] Yes Take 1 tablet (10 mg total) by mouth daily. Tonia Ghent, MD Taking Active   metFORMIN (GLUCOPHAGE) 500 MG tablet [id number] Yes TAKE 1/2 TABLET BY MOUTH TWICE DAILY WITH A MEAL  Patient taking differently: TAKE 1/2 TABLET BY MOUTH DAILY WITH A MEAL   Tonia Ghent, MD Taking Active   rosuvastatin (CRESTOR) 10 MG tablet [id number] Yes Take 1 tablet (10 mg total) by mouth daily. Tonia Ghent, MD Taking Active   sacubitril-valsartan [facility])  97-103 MG [id number] Yes Take 1 tablet by mouth 2 (two) times daily. Bensimhon,  Shaune Pascal, MD Taking Active   traMADol Veatrice [place]bon) 50 MG tablet [id number] Yes TAKE 1 TABLET(50 MG) BY MOUTH EVERY 12 HOURS Tonia Ghent, MD Taking Active             Patient Active Problem List   Diagnosis Date Noted   Onychomycosis of multiple toenails with type 2 diabetes mellitus and peripheral angiopathy ([place]) 11/16/2021   Pain due to onychomycosis of toenail of left foot 11/16/2021   Intermittent claudication of both lower extremities due to atherosclerosis ([place]) 08/22/2021   Ganglion cyst of wrist 08/22/2021   On amiodarone therapy 08/21/2020   Hyperkalemia 08/21/2020   CKD (chronic kidney disease) stage 3, GFR 30-59 ml/min (HCC) 09/25/2019   Gastroesophageal reflux disease 06/19/2019   Abdominal pain 02/06/2019   Health care maintenance 11/14/2018   Joint pain 08/07/2018   Cough 06/17/2018   Sensorineural hearing loss (SNHL), bilateral 03/20/2018   Complex sleep apnea syndrome 10/05/2017   Chronic heart failure with preserved ejection fraction (HFpEF) ([place]) 09/08/2017   Dilated cardiomyopathy ([place]) 06/30/2017   Frequent PVCs 06/30/2017   Advance care planning 08/19/2014   Medicare annual wellness visit, subsequent 07/30/2013   KNEE PAIN, LEFT 11/09/2010   Type II diabetes mellitus with peripheral artery disease ([place]) 05/19/2010   HLD (hyperlipidemia) 05/19/2010   Essential hypertension 05/19/2010   EMPHYSEMA 05/19/2010   HIATAL HERNIA WITH REFLUX 05/19/2010   Ichthyosis 05/19/2010    Immunization History  Administered Date(s) Administered   Fluad Quad(high Dose 65+) 08/19/2021   Influenza Whole 08/12/2010   Influenza,inj,Quad PF,6+ Mos 08/19/2014, 08/15/2015, 09/02/2016, 08/05/2019   Influenza-Unspecified 09/11/2018   PFIZER Comirnaty(Gray Top)Covid-19 Tri-Sucrose Vaccine 03/23/2021   PFIZER(Purple Top)SARS-COV-2 Vaccination 02/09/2020, 02/29/2020, 09/15/2020   Pneumococcal  Conjugate-13 03/10/2015   Pneumococcal Polysaccharide-23 05/26/2006   Td 06/14/2007   Zoster Recombinat (Shingrix) 08/12/2020, 10/12/2020   Zoster, Live 08/10/2006    Conditions to be addressed/monitored:  Hypertension, Hyperlipidemia, Diabetes, Heart Failure, and Chronic Kidney Disease, PAD  Care Plan : [place]  Updates made by Charlton Haws, [place] since 11/24/2021 12:00 AM     Problem: Hypertension, Hyperlipidemia, Diabetes, Heart Failure, and Chronic Kidney Disease, PAD   Priority: High     Long-Range Goal: Disease management   Start Date: 09/29/2021  Expected End Date: 09/29/2022  This Visit's Progress: On track  Recent Progress: On track  Priority: High  Note:   Current Barriers:  Unable to independently monitor therapeutic efficacy Unable to maintain control of HTN  Pharmacist Clinical Goal(s):  Patient will achieve adherence to monitoring guidelines and medication adherence to achieve therapeutic efficacy adhere to plan to optimize therapeutic regimen for HTN as evidenced by report of adherence to recommended medication management changes through collaboration with PharmD and provider.   Interventions: 1:1 collaboration with Tonia Ghent, MD regarding development and update of comprehensive plan of care as evidenced by provider attestation and co-signature Inter-disciplinary care team collaboration (see longitudinal plan of care) Comprehensive medication review performed; medication list updated in electronic medical record  Hyperlipidemia / PAD (LDL goal < 70) -Not ideally controlled - pt endorses compliance with new rosuvastatin (changed last visit from lovastatin due to leg cramps/LDL not at goal); -New occlusive PAD Sept 2022; considering Xarelto 2.5 mg, rosuvastatin w/ cardiologist -Current treatment: Rosuvastatin 10 mg daily Aspirin 81 mg daily -Previously tried/failed meds: lovastatin -[facility] -Educated on Cholesterol goals; Benefits of statin for ASCVD risk reduction; Strategies to manage statin-induced  myalgias; benefits of aspirin in PAD -Counseled to continued daily exercise routine -Recommend to continue current medication; repeat lipid panel in 8-12 weeks  Diabetes (A1c goal <7%) -Controlled - A1c is at goal; pt normally checked BG once a week but has checked daily over past week in preparation for this appt; she endorses compliance with medications as below; she is working on pt assistance for [employer] with cardiology  -Current home glucose readings fasting glucose: 94-102 -Current medications: Metformin 500 mg - 1/2 tab daily Jardiance 10 mg - 1/2 tab daily Testing supplies -Denies hypoglycemic/hyperglycemic symptoms -Educated on A1c and blood sugar goals; Complications of diabetes including kidney damage, retinal damage, and cardiovascular disease; benefits of metformin and Jardiance for DM and CV protection -Recommend to continue current medication  Heart Failure / Hypertension (Goal: BP < 140/90) -Not ideally controlled - pt endorses compliance with medications as prescribed; she has been checking BP in AM before meds for the past week and readings have been elevated; she also reports she has trouble exercising after taking her BP meds but feels better if she exercises before taking meds -Current home BP/HR readings:  12/5: 178/77 12/6: 160/77 12/7: 172/83 12/8: 198/102 154/82 12/9: 174/97 155/80 -Hx frequent PVCs controlled with amiodarone -Last ejection fraction: 60-65% (Date: 12/25/19) -HF type: Diastolic -NYHA Class: I (no actitivty limitation) -Current treatment: Carvedilol 25 mg BID Furosemide 20 mg MWF PRN Entresto 97-103 mg BID Amiodarone 200 mg - 1/2 tab daily Jardiance 10 mg -1/2 tab daily -Educated on Benefits of medications for managing symptoms and prolonging life; Importance of blood pressure control; proper BP monitoring technique -Pt is  taking 2 BID medications (Entresto, carvedilol) and she reports very rarely missing PM doses - these PM doses should be controlling BP overnight and into morning, so BP before AM doses should not be significantly higher than other times of day; concern for persistently elevated BP on a daily basis -Counseled on beta blocker side effects including exercise intolerance; it is likely that carvedilol is contributing to her early fatigue while walking; advised she can exercise early in AM and take carvedilol afterward -Recommended adding hydralazine 25 mg TID; pt is resistant to adding a new medication and wanted time to focus on diet changes to improve BP; will defer med changes 1 month -Recommended to check BP twice daily - AM and bedtime  Health Maintenance -Vaccine gaps: TDAP, Covid booster -Counseled to get bivalent covid booster at [facility]; discussed benefits/risks of TDAP booster, pt is low risk for TD and would like to forego 10-year booster -Current therapy:  Vitamin D 1000 IU daily Tramadol 50 mg BID prn Tylenol 500 mg  -Patient is satisfied with current therapy and denies issues -Recommended to continue current medication  Patient Goals/Self-Care Activities Patient will:  - take medications as prescribed -focus on medication adherence by pill box -check blood pressure twice daily (AM and bedtime) -collaborate with provider on medication access solutions        Medication Assistance:  -Delene Loll - [employer] PAP approved through cardiology until 12/11/21 -Jardiance - [employer] PAP in process; forms faxed from cardiology office 11/08/21  Compliance/Adherence/Medication fill history: Care Gaps: None  Star-Rating Drugs: Metformin 585m                                08/09/2021      90  Lovastatin 82m                                 09/04/2021      90                                 Jardiance 143m                                 09/17/2021       30  Patient's preferred pharmacy is:  [facility]one: [phone]: [phone] [address]: [phone]: [phone] pill box? Yes - 30 day pill box Pt endorses 100% compliance  We discussed: Current pharmacy is preferred with insurance plan and patient is satisfied with pharmacy services Patient decided to: Continue current medication management strategy  Care Plan and Follow Up Patient Decision:  Patient agrees to Care Plan and Follow-up.  Plan: Telephone follow up appointment with care management team member scheduled for:  1 month  LiCharlene BrookePharmD, BCRichfieldCPP Clinical Pharmacist [facility] [phone]

## 2021-11-22 ENCOUNTER — Encounter: Payer: Self-pay | Admitting: Family Medicine

## 2021-11-23 ENCOUNTER — Encounter: Payer: Self-pay | Admitting: Cardiovascular Disease

## 2021-11-23 ENCOUNTER — Other Ambulatory Visit: Payer: Self-pay | Admitting: Family Medicine

## 2021-11-23 MED ORDER — OMEPRAZOLE 20 MG PO CPDR: 20.0000 mg | DELAYED_RELEASE_CAPSULE | Freq: Every day | ORAL | 3 refills | Status: DC

## 2021-11-23 NOTE — Telephone Encounter (Signed)
Please triage patient.  I sent PPI rx in the meantime.  See mychart message.  Thanks.

## 2021-11-24 ENCOUNTER — Telehealth (HOSPITAL_COMMUNITY): Payer: Self-pay | Admitting: Pharmacy Technician

## 2021-11-24 NOTE — Telephone Encounter (Signed)
Noted. Thanks.

## 2021-11-24 NOTE — Telephone Encounter (Signed)
Advanced Heart Failure Patient Advocate Encounter  Received a notice from [employer] that it is time to renew [place] assistance. Let voicemail for patient to call back and start the re-enrollment process.   I see a note from Al Corpus, [facility] with internal medicine that they faxed over her Jardiance form to our office. I have yet to see that and will reach back out to that team.   Archer Asa, CPhT

## 2021-11-24 NOTE — Patient Instructions (Signed)
Visit Information  Phone number for Pharmacist: [phone]   Goals Addressed             This Visit's Progress    Track and Manage My Blood Pressure-Hypertension       Timeframe:  Short-Term Goal Priority:  High Start Date:      09/29/21                       Expected End Date:    11/29/21                   Follow Up Date Jan 2023   - check blood pressure twice daily - after AM meds and before PM meds - choose a place to take my blood pressure (home, clinic or office, retail store) - write blood pressure results in a log or diary    Why is this important?   You won't feel high blood pressure, but it can still hurt your blood vessels.  High blood pressure can cause heart or kidney problems. It can also cause a stroke.  Making lifestyle changes like losing a little weight or eating less salt will help.  Checking your blood pressure at home and at different times of the day can help to control blood pressure.  If the doctor prescribes medicine remember to take it the way the doctor ordered.  Call the office if you cannot afford the medicine or if there are questions about it.     Notes:         Care Plan : [place]  Updates made by Charlton Haws, RPH since 11/24/2021 12:00 AM     Problem: Hypertension, Hyperlipidemia, Diabetes, Heart Failure, and Chronic Kidney Disease, PAD   Priority: High     Long-Range Goal: Disease management   Start Date: 09/29/2021  Expected End Date: 09/29/2022  This Visit's Progress: On track  Recent Progress: On track  Priority: High  Note:   Current Barriers:  Unable to independently monitor therapeutic efficacy Unable to maintain control of HTN  Pharmacist Clinical Goal(s):  Patient will achieve adherence to monitoring guidelines and medication adherence to achieve therapeutic efficacy adhere to plan to optimize therapeutic regimen for HTN as evidenced by report of adherence to recommended medication management  changes through collaboration with PharmD and provider.   Interventions: 1:1 collaboration with Tonia Ghent, MD regarding development and update of comprehensive plan of care as evidenced by provider attestation and co-signature Inter-disciplinary care team collaboration (see longitudinal plan of care) Comprehensive medication review performed; medication list updated in electronic medical record  Hyperlipidemia / PAD (LDL goal < 70) -Not ideally controlled - pt endorses compliance with new rosuvastatin (changed last visit from lovastatin due to leg cramps/LDL not at goal); -New occlusive PAD Sept 2022; considering Xarelto 2.5 mg, rosuvastatin w/ cardiologist -Current treatment: Rosuvastatin 10 mg daily Aspirin 81 mg daily -Previously tried/failed meds: lovastatin -[facility] -Educated on Cholesterol goals; Benefits of statin for ASCVD risk reduction; Strategies to manage statin-induced myalgias; benefits of aspirin in PAD -Counseled to continued daily exercise routine -Recommend to continue current medication; repeat lipid panel in 8-12 weeks  Diabetes (A1c goal <7%) -Controlled - A1c is at goal; pt normally checked BG once a week but has checked daily over past week in preparation for this appt; she endorses compliance with medications as below; she is working on pt assistance for [employer] with  cardiology  -Current home glucose readings fasting glucose: 94-102 -Current medications: Metformin 500 mg - 1/2 tab daily Jardiance 10 mg - 1/2 tab daily Testing supplies -Denies hypoglycemic/hyperglycemic symptoms -Educated on A1c and blood sugar goals; Complications of diabetes including kidney damage, retinal damage, and cardiovascular disease; benefits of metformin and Jardiance for DM and CV protection -Recommend to continue current medication  Heart Failure / Hypertension (Goal: BP < 140/90) -Not ideally controlled - pt endorses  compliance with medications as prescribed; she has been checking BP in AM before meds for the past week and readings have been elevated; she also reports she has trouble exercising after taking her BP meds but feels better if she exercises before taking meds -Current home BP/HR readings:  12/5: 178/77 12/6: 160/77 12/7: 172/83 12/8: 198/102 154/82 12/9: 174/97 155/80 -Hx frequent PVCs controlled with amiodarone -Last ejection fraction: 60-65% (Date: 12/25/19) -HF type: Diastolic -NYHA Class: I (no actitivty limitation) -Current treatment: Carvedilol 25 mg BID Furosemide 20 mg MWF PRN Entresto 97-103 mg BID Amiodarone 200 mg - 1/2 tab daily Jardiance 10 mg -1/2 tab daily -Educated on Benefits of medications for managing symptoms and prolonging life; Importance of blood pressure control; proper BP monitoring technique -Pt is taking 2 BID medications (Entresto, carvedilol) and she reports very rarely missing PM doses - these PM doses should be controlling BP overnight and into morning, so BP before AM doses should not be significantly higher than other times of day; concern for persistently elevated BP on a daily basis -Counseled on beta blocker side effects including exercise intolerance; it is likely that carvedilol is contributing to her early fatigue while walking; advised she can exercise early in AM and take carvedilol afterward -Recommended adding hydralazine 25 mg TID; pt is resistant to adding a new medication and wanted time to focus on diet changes to improve BP; will defer med changes 1 month -Recommended to check BP twice daily - AM and bedtime  Health Maintenance -Vaccine gaps: TDAP, Covid booster -Counseled to get bivalent covid booster at [facility]; discussed benefits/risks of TDAP booster, pt is low risk for TD and would like to forego 10-year booster -Current therapy:  Vitamin D 1000 IU daily Tramadol 50 mg BID prn Tylenol 500 mg  -Patient is satisfied with current therapy  and denies issues -Recommended to continue current medication  Patient Goals/Self-Care Activities Patient will:  - take medications as prescribed -focus on medication adherence by pill box -check blood pressure twice daily (AM and bedtime) -collaborate with provider on medication access solutions       Patient verbalizes understanding of instructions provided today and agrees to view in MyChart.  Telephone follow up appointment with pharmacy team member scheduled for: 1 month  Al Corpus, PharmD, [facility] Clinical Pharmacist [facility] at [facility] [phone]

## 2021-11-24 NOTE — Telephone Encounter (Signed)
Pt has not gotten omeprazole yet but pt said she appreciates the call letting her know omeprazole was sent to [facility] [address]. Pt said today she is doing better with the heartburn; no CP or SOB. Pt said she has been taking tylenol so she is not sure if that is helping her heartburn or not; pt said it is just better. UC & ED precautions given and pt voiced understanding. Pt said she would cb if needed. Sending note to Dr Para March and Shanda Bumps CMA.

## 2021-11-25 NOTE — Telephone Encounter (Signed)
Advanced Heart Failure Patient Advocate Encounter  Patient called back. She was graduated from the AHF clinic. Went over steps to renew [employer] assistance. She requested a copy of the application to be mailed to her so that she can forward the information to Dr. Elissa Hefty office, as they also help with her Jardiance.  Archer Asa, CPhT

## 2021-11-28 ENCOUNTER — Other Ambulatory Visit: Payer: Self-pay | Admitting: Family Medicine

## 2021-11-29 ENCOUNTER — Other Ambulatory Visit: Payer: Self-pay | Admitting: *Deleted

## 2021-11-29 MED ORDER — EMPAGLIFLOZIN 10 MG PO TABS: ORAL_TABLET | ORAL | 3 refills | Status: DC

## 2021-11-29 NOTE — Progress Notes (Signed)
Open error 

## 2021-11-29 NOTE — Telephone Encounter (Signed)
Printed prescription is needed for [employer] - Jardiance 5 mg -- 1/2 tablet of 10 mg patient assistance.

## 2021-12-02 ENCOUNTER — Telehealth: Payer: Self-pay

## 2021-12-02 NOTE — Progress Notes (Signed)
° ° °  Chronic Care Management Pharmacy Assistant   Name: Natasha Woodard  MRN: [id number] DOB: Oct 31, 1941  Reason for Encounter: CCM (Blood Pressure Log)   I called patient to get her blood pressure log. Patient stated she changed the batteries in her blood pressure cuff for the 12/20 reading as she did not know how accurate the readings were before she changed them.   Blood pressure readings are as follows: 12/22 - 163/75 12/20 - 127/88 12/14 - 163/78 12/13 - 168/80 12/12 - 157/77 12/10 - 130/60 12/09 - 155/80  Al Corpus, CPP notified  Claudina Lick, RMA Clinical Pharmacy Assistant [phone]  Time Spent:  10 Minutes

## 2021-12-07 DIAGNOSIS — M25531 Pain in right wrist: Secondary | ICD-10-CM | POA: Diagnosis not present

## 2021-12-07 DIAGNOSIS — M25511 Pain in right shoulder: Secondary | ICD-10-CM | POA: Diagnosis not present

## 2021-12-07 DIAGNOSIS — M25521 Pain in right elbow: Secondary | ICD-10-CM | POA: Diagnosis not present

## 2021-12-09 ENCOUNTER — Encounter: Payer: Self-pay | Admitting: Cardiology

## 2021-12-09 ENCOUNTER — Encounter: Payer: Self-pay | Admitting: Family Medicine

## 2021-12-09 MED ORDER — ENTRESTO 97-103 MG PO TABS: 1.0000 | ORAL_TABLET | Freq: Two times a day (BID) | ORAL | 3 refills | Status: DC

## 2021-12-09 NOTE — Telephone Encounter (Signed)
Spoke with  patient .  RN received information but the wrong patient assistance form filled out.  Patient needs to fill out the [employer] patient assistance foundation and prescription needs to sign by the doctor. Patient will come by the office to complete.

## 2021-12-11 DIAGNOSIS — E782 Mixed hyperlipidemia: Secondary | ICD-10-CM

## 2021-12-11 DIAGNOSIS — E1151 Type 2 diabetes mellitus with diabetic peripheral angiopathy without gangrene: Secondary | ICD-10-CM | POA: Diagnosis not present

## 2021-12-11 DIAGNOSIS — I10 Essential (primary) hypertension: Secondary | ICD-10-CM

## 2021-12-11 DIAGNOSIS — I5032 Chronic diastolic (congestive) heart failure: Secondary | ICD-10-CM | POA: Diagnosis not present

## 2021-12-14 ENCOUNTER — Other Ambulatory Visit: Payer: Self-pay | Admitting: Family Medicine

## 2021-12-14 ENCOUNTER — Encounter: Payer: Self-pay | Admitting: Family Medicine

## 2021-12-14 ENCOUNTER — Telehealth (INDEPENDENT_AMBULATORY_CARE_PROVIDER_SITE_OTHER): Payer: Medicare Other | Admitting: Family Medicine

## 2021-12-14 DIAGNOSIS — J069 Acute upper respiratory infection, unspecified: Secondary | ICD-10-CM

## 2021-12-14 MED ORDER — NIRMATRELVIR/RITONAVIR (PAXLOVID)TABLET: 3.0000 | ORAL_TABLET | Freq: Two times a day (BID) | ORAL | 0 refills | Status: DC

## 2021-12-14 MED ORDER — TRAMADOL HCL 50 MG PO TABS: 50.0000 mg | ORAL_TABLET | Freq: Three times a day (TID) | ORAL | 5 refills | Status: DC | PRN

## 2021-12-14 NOTE — Assessment & Plan Note (Signed)
Day 3 of head and chest congestion/no fever  Discussed symptom care Nasal saline may help congestion  covid swab (poct and pcr) ordered-will come tomorrow drive through  If positive she would be interested in antiviral tx Disc need for fluids and rest  Isolate until test result  Update if not starting to improve in a week or if worsening  ER precautions reviewed

## 2021-12-14 NOTE — Patient Instructions (Signed)
The office will call you to set up covid testing tomorrow here at the [place] office  We will make a plan from there  Treat your symptoms  The alka selzer plus is ok-take as directed   Drink lots of fluids Try nasal saline spray for congestion (I like the brand called simply saline)  If symptoms become severe or you have trouble breathing please alert [place] and go to the ER

## 2021-12-14 NOTE — Progress Notes (Signed)
Virtual Visit via Video Note  I connected with Natasha Woodard on 12/14/21 at  4:00 PM EST by a video enabled telemedicine application and verified that I am speaking with the correct person using two identifiers.  Location: Patient: car Provider: office   I discussed the limitations of evaluation and management by telemedicine and the availability of in person appointments. The patient expressed understanding and agreed to proceed.  Parties involved in encounter  Patient: Natasha Woodard   Provider:  Loura Pardon MD   History of Present Illness: 81 yo pt of Dr Damita Dunnings with known DM2 and CHF presents with cough and congestion /uri symptoms   Started Saturday  Coughing with mucous (clear) Sneezing-uncofortable  Nasal congestion  Weak and tired   No wheezing Not sob   Some ST Ears feel clogged up  No n/v/d  Has an appetite    No body aches or chills   Husband and a sister were sick/holidays  She got worn down as well   Has not done a covid test Has one left    Otc Alka selzer plus  Tylenol- takes 2 per day for arthitis Claritin causes hallucinations   Has some vics vapor rub Tried some mucinex  Drinking fluids  Blood sugar did get a little high   Patient Active Problem List   Diagnosis Date Noted   Viral URI with cough 12/14/2021   Onychomycosis of multiple toenails with type 2 diabetes mellitus and peripheral angiopathy ([place]) 11/16/2021   Pain due to onychomycosis of toenail of left foot 11/16/2021   Intermittent claudication of both lower extremities due to atherosclerosis ([place]) 08/22/2021   Ganglion cyst of wrist 08/22/2021   On amiodarone therapy 08/21/2020   Hyperkalemia 08/21/2020   CKD (chronic kidney disease) stage 3, GFR 30-59 ml/min ([place]) 09/25/2019   Gastroesophageal reflux disease 06/19/2019   Abdominal pain 02/06/2019   Health care maintenance 11/14/2018   Joint pain 08/07/2018   Cough 06/17/2018   Sensorineural hearing loss (SNHL),  bilateral 03/20/2018   Complex sleep apnea syndrome 10/05/2017   Chronic heart failure with preserved ejection fraction (HFpEF) ([place]) 09/08/2017   Dilated cardiomyopathy ([place]) 06/30/2017   Frequent PVCs 06/30/2017   Advance care planning 08/19/2014   Medicare annual wellness visit, subsequent 07/30/2013   KNEE PAIN, LEFT 11/09/2010   Type II diabetes mellitus with peripheral artery disease ([place]) 05/19/2010   HLD (hyperlipidemia) 05/19/2010   Essential hypertension 05/19/2010   EMPHYSEMA 05/19/2010   HIATAL HERNIA WITH REFLUX 05/19/2010   Ichthyosis 05/19/2010   Past Medical History:  Diagnosis Date   Calcific tendonitis 2008   Treatment of left leg   Cardiomyopathy ([place]) 06/2017   a) Echo: EF 25-30%. GR 1-2 DD w/ elevated LVEDP. Mild valvular Dz; b) Cardiac MRI 2/'19: frequent PVCs (diffiuclt to interpret) - EF ~27% w/ diffuse HK.  No evidence of infarct, infiltrative Dz or myocarditis. -- ? if related to PVCs. c) f/u Echo 6/'19: EF 35-40%. Gr 1 DD. Diffuse HK.-> d) Jan 2020 EF 50 to 55%.  Mod MAC, mod LAE.  Ao Sclerosis; e) Echo 1/'21 - EF 60-65%, Gr II DD. Mild Mod MR.    Chronic combined systolic and diastolic CHF, NYHA class 2 and ACA/AHA stage C    Now essentially resolved, back to simply diastolic CHF   Coronary artery disease, non-occlusive    mild-moderate CAD 06/30/17 cath   CTS (carpal tunnel syndrome)    Diabetes mellitus    Type II   Dysrhythmia  patient said that she cant remember what it is   Frequent unifocal PVCs 01/2018   Event monitor showed normal sinus rhythm with frequent multifocal PVCs (9%) and PACs.  Nighttime bradycardia suggestive of OSA.   Hiatal hernia    with reflux   Hyperlipidemia    Statin intolerant   Hypertension    Ichthyosis congenita    Intermittent claudication of both lower extremities due to atherosclerosis ([place]) 08/22/2021   LEA Dopplers 09/09/2021:  Right: Total occlusion noted in the superficial femoral artery. Atherosclerosis noted  throughout extremity, see note above. Three vessel runoff.  Left: Total occlusion noted in the superficial femoral artery and/or popliteal artery. Total occlusion noted in the distal anterior tibial artery. Athereosclerosis noted throughout extremity.    NSVD (normal spontaneous vaginal delivery) 1971 & 1972   Obesity    Plantar fasciitis    Left   Pulmonary nodule    Imaged multiple times and benign appearing   Wears glasses    Past Surgical History:  Procedure Laterality Date   ABDOMINAL HYSTERECTOMY  1975   TAH-BSO   ABSCESS DRAINAGE  1972   right breast   BREAST BIOPSY Left 01/14/2009   Stereo- Benign   CARDIAC MRI  01/2018   Difficult to interpret 2/2 PVCs. Normal LV size - EF ~27% with diffuse HK. Mild RV dilation - normal fxn. -- NO MYOCARDIAL LGI => no definitive evidence of prior MI, Infiltrative Dz or Myocarditis -- suspect NICM, possibly related to PVCs.   CARPAL TUNNEL RELEASE Right 07/02/2014   Procedure: RIGHT CARPAL TUNNEL RELEASE;  Surgeon: Wynonia Sours, MD;  Location: [place];  Service: Orthopedics;  Laterality: Right;   CARPAL TUNNEL RELEASE Left 06/11/2015   Procedure: LEFT CARPAL TUNNEL RELEASE;  Surgeon: Daryll Brod, MD;  Location: [place];  Service: Orthopedics;  Laterality: Left;  REGIONAL/FAB   COLONOSCOPY     corn removal  1968   right   Holter Monitor  06/2017   ~17,000 PVC beats - majority were singlets, some couplets. 44 brief 3-7 beat runs of NSVT. Also noted were less frequent PACs with 11 runs (longest 15 beats)   OPEN REDUCTION INTERNAL FIXATION (ORIF) DISTAL RADIAL FRACTURE Left 07/21/2017   Procedure: OPEN REDUCTION INTERNAL FIXATION (ORIF) LEFT DISTAL RADIAL FRACTURE;  Surgeon: Renette Butters, MD;  Location: [place];  Service: Orthopedics;  Laterality: Left;   RIGHT/LEFT HEART CATH AND CORONARY ANGIOGRAPHY N/A 06/30/2017   Procedure: Right/Left Heart Cath and Coronary Angiography;  Surgeon: Leonie Man, MD;   Location: [facility] INVASIVE CV LAB:  pRCA 55%, pCx 40%, OM1 45%, mCx 50%, D2 50% - LVEF 25-35%. Moderately elevated LVEDP(26 mmHg with PCWP 16 mmHg).  FICK CO/CI: 4.47/2.48. PA pressures 47/14 mmHg with a mean of 27 mm.   ROTATOR CUFF REPAIR     left shoulder   TRANSTHORACIC ECHOCARDIOGRAM  06/2017   EF 25 and 30%. GR 1 DD. Mild diastolic dysfunction with elevated LVEDP. Mild valvular disease.   TRANSTHORACIC ECHOCARDIOGRAM  11'18, 6/'19    a) EF remains 30-35%.  Diffuse hypokinesis noted.  Severe LA dilation; b) Improved EF 35-40%.  Diffuse HK.  GR 1 DD.  Moderate TR.  Mild RV dilation.   TRANSTHORACIC ECHOCARDIOGRAM  1/'20; 1/'21   a) EF 50 to 55%. Gr I DD. (Notabley improved LV Fxn.  Moderate LA dilation.  Mod MAC.  AoV sclerosis w/o stenosis;; b) EF 60 to 65%.  Normal LV size and function.  GRII  DD.  Mild to moderate MR.  Normal PA pressures.   ZIO Patch 14 d Event Monitor  10/2018   Results as below < 1% PVC.  (Notably improved after starting amiodarone)   Social History   Tobacco Use   Smoking status: Former    Years: 28.00    Types: Cigarettes    Quit date: 12/12/1993    Years since quitting: 28.0   Smokeless tobacco: Never  Vaping Use   Vaping Use: Never used  Substance Use Topics   Alcohol use: No    Alcohol/week: 0.0 standard drinks   Drug use: No   Family History  Problem Relation Age of Onset   Heart disease Mother        s/p pacemaker   Diabetes Mother    Heart disease Father    Diabetes Father    Heart disease Brother        MI   Cancer Brother        Lung   Hypertension Other    Colon cancer Neg Hx    Breast cancer Neg Hx    Allergies  Allergen Reactions   Ezetimibe-Simvastatin Other (See Comments)    REACTION: Muscle aches (side effect)   Lipitor [Atorvastatin] Other (See Comments)    Leg weakness    Tamiflu [Oseltamivir Phosphate] Other (See Comments)    nightmares   Pravastatin Sodium Other (See Comments)    REACTION: Muscle aches (side effect)    Sulfonamide Derivatives Nausea And Vomiting   Current Outpatient Medications on File Prior to Visit  Medication Sig Dispense Refill   acetaminophen (TYLENOL) 500 MG tablet Take 500 mg by mouth every 6 (six) hours as needed.     amiodarone (PACERONE) 200 MG tablet TAKE 1/2 TABLET BY MOUTH EVERY DAY 90 tablet 3   aspirin 81 MG tablet Take 1 tablet (81 mg total) by mouth daily.     carvedilol (COREG) 25 MG tablet TAKE 1 TABLET BY MOUTH TWICE DAILY 180 tablet 1   Cholecalciferol (VITAMIN D) 1000 UNITS capsule Take 1,000 Units by mouth daily.     empagliflozin (JARDIANCE) 10 MG TABS tablet TAKE 1/2 TABLET BY MOUTH DAILY BEFORE BREAKFAST 45 tablet 3   furosemide (LASIX) 20 MG tablet TAKE 1 TABLET BY MOUTH EVERY MONDAY, WEDNESDAY, AND FRIDAY 20 tablet 6   glucose blood (ACCU-CHEK AVIVA PLUS) test strip USE TO TEST BLOOD SUGAR ONCE DAILY FOR DM E11.9 100 each 1   Lancets (ACCU-CHEK MULTICLIX) lancets USE ONE LANCET TO TEST BLOOD GLUCOSE DAILY FOR DM 250.00 102 each 3   loratadine (CLARITIN) 10 MG tablet Take 1 tablet (10 mg total) by mouth daily.     metFORMIN (GLUCOPHAGE) 500 MG tablet TAKE 1/2 TABLET BY MOUTH TWICE DAILY WITH A MEAL (Patient taking differently: TAKE 1/2 TABLET BY MOUTH DAILY WITH A MEAL) 90 tablet 1   omeprazole (PRILOSEC) 20 MG capsule Take 1 capsule (20 mg total) by mouth daily. 30 capsule 3   rosuvastatin (CRESTOR) 10 MG tablet Take 1 tablet (10 mg total) by mouth daily. 90 tablet 3   sacubitril-valsartan (ENTRESTO) 97-103 MG Take 1 tablet by mouth 2 (two) times daily. 180 tablet 3   traMADol (ULTRAM) 50 MG tablet Take 1 tablet (50 mg total) by mouth 3 (three) times daily as needed. 90 tablet 5   No current facility-administered medications on file prior to visit.   Review of Systems  Constitutional:  Positive for malaise/fatigue. Negative for chills and fever.  HENT:  Positive for congestion and sore throat. Negative for ear pain and sinus pain.   Eyes:  Negative for blurred  vision, discharge and redness.  Respiratory:  Positive for cough and sputum production. Negative for shortness of breath, wheezing and stridor.   Cardiovascular:  Negative for chest pain, palpitations and leg swelling.  Gastrointestinal:  Negative for abdominal pain, diarrhea, nausea and vomiting.  Musculoskeletal:  Negative for myalgias.  Skin:  Negative for rash.  Neurological:  Positive for headaches. Negative for dizziness.      Observations/Objective: Patient appears well, in no distress Weight is baseline  No facial swelling or asymmetry Voice is hoarse, sounds nasally congested also No obvious tremor or mobility impairment Moving neck and UEs normally Able to hear the call well  No wheeze or shortness of breath during interview  Occasional wet cough noted Talkative and mentally sharp with no cognitive changes No skin changes on face or neck , no rash or pallor Affect is normal    Assessment and Plan: Problem List Items Addressed This Visit       Respiratory   Viral URI with cough    Day 3 of head and chest congestion/no fever  Discussed symptom care Nasal saline may help congestion  covid swab (poct and pcr) ordered-will come tomorrow drive through  If positive she would be interested in antiviral tx Disc need for fluids and rest  Isolate until test result  Update if not starting to improve in a week or if worsening  ER precautions reviewed           Follow Up Instructions: The office will call you to set up covid testing tomorrow here at the [place] office  We will make a plan from there  Treat your symptoms  The alka selzer plus is ok-take as directed   Drink lots of fluids Try nasal saline spray for congestion (I like the brand called simply saline)  If symptoms become severe or you have trouble breathing please alert [place] and go to the ER    I discussed the assessment and treatment plan with the patient. The patient was provided an opportunity to  ask questions and all were answered. The patient agreed with the plan and demonstrated an understanding of the instructions.   The patient was advised to call back or seek an in-person evaluation if the symptoms worsen or if the condition fails to improve as anticipated.     Loura Pardon, MD

## 2021-12-15 ENCOUNTER — Other Ambulatory Visit (INDEPENDENT_AMBULATORY_CARE_PROVIDER_SITE_OTHER): Payer: Medicare Other

## 2021-12-15 ENCOUNTER — Other Ambulatory Visit: Payer: Self-pay

## 2021-12-15 DIAGNOSIS — J069 Acute upper respiratory infection, unspecified: Secondary | ICD-10-CM | POA: Diagnosis not present

## 2021-12-15 LAB — POC COVID19 BINAXNOW: SARS Coronavirus 2 Ag: NEGATIVE

## 2021-12-16 ENCOUNTER — Telehealth: Payer: Self-pay

## 2021-12-16 ENCOUNTER — Telehealth: Payer: Self-pay | Admitting: Cardiology

## 2021-12-16 LAB — NOVEL CORONAVIRUS, NAA: SARS-CoV-2, NAA: NOT DETECTED

## 2021-12-16 LAB — SARS-COV-2, NAA 2 DAY TAT

## 2021-12-16 NOTE — Progress Notes (Signed)
Spoke with Evette and left a message for staff of Dr.Harding to send in a  new prescription for Jardiance with specific instructions on taking the medication and fax  to [facility] cares pharmacy. [phone]  Triage RN called and confirmed message with me.    Al Corpus, CPP notified  Burt Knack, [facility] Clincal Pharmacy Assistant [phone]  Total time spent for month CPA: 20 min

## 2021-12-16 NOTE — Telephone Encounter (Signed)
They are trying to get empagliflozin (JARDIANCE) 10 MG TABS tablet medication through patient assistance [employer] fax number [phone]. They need for  Dr. Herbie Baltimore to be specific in the instructions.  They last script they had come through was 11/08/21, they said it was incomplete.  If you have any questions you can call Lacie Draft PCP, [phone]

## 2021-12-16 NOTE — Telephone Encounter (Signed)
Spoke to [employer] with Adult nurse at [facility].She stated Dr.Harding needs to fax a new Jardiance prescription to [facility] at fax # [phone].Stated they have a incomplete prescription for Jardiance.Advised I will send message to Dr.Hardings RN.

## 2021-12-16 NOTE — Telephone Encounter (Signed)
-----   Message from [name], [facility] sent at 12/14/2021 10:01 AM EST ----- Regarding: RE: BI Cares Can you please contact cardiology and let them know - I believe Dr Herbie Baltimore is prescriber.  ----- Message ----- From: Burt Knack, CMA Sent: 12/07/2021  12:28 PM EST To: [name], RPH Subject: RE: BI Cares                                    Contacted BI cares and they informed me that the last RX on 11/28 was incomplete. They are waiting for a complete RX to be faxed into  [phone] and if provider wants 1/2 tablet , be specific on those instructions.  Burt Knack , CMA ----- Message ----- From: [name], [facility] Sent: 12/02/2021   4:29 PM EST To: Burt Knack, CMA Subject: RE: BI Cares                                   Please try again next week.  ----- Message ----- From: Burt Knack, CMA Sent: 11/29/2021   3:25 PM EST To: [name], RPH Subject: RE: 59 Cares                                   I have attempted 3 times to get through to [facility] cares and have been unsuccessful, left multiple messages regarding my request and they have not called me back.  Andrell Tallman ----- Message ----- From: [name], [facility] Sent: 11/24/2021   1:01 PM EST To: Burt Knack, CMA Subject: BI Cares                                       Please call BI Cares to check on Jardiance assistance - it was brought to cardiology office 11/28 and was supposedly faxed then. We haven't heard anything back yet.

## 2021-12-17 ENCOUNTER — Telehealth: Payer: Self-pay | Admitting: Family Medicine

## 2021-12-17 NOTE — Telephone Encounter (Signed)
Needs to f/u with her PCP about that, thanks for letting me know.  Hold claritin (she said this did it) Some otc cold/cough meds could potentially cause trouble as well.      Will cc to pcp

## 2021-12-17 NOTE — Telephone Encounter (Signed)
Patient was scheduled for 12/21/21 at 4:00 pm and patient is aware.

## 2021-12-17 NOTE — Telephone Encounter (Signed)
Please get her scheduled for follow up in person Thanks.

## 2021-12-17 NOTE — Telephone Encounter (Signed)
Called pt to let her know both PCR and POC Covid test were negative. Pt then asked me: why is she having hallucinations, if her test were negative. I advised pt I would let PCP and Dr. Milinda Antis know she's having hallucinations. Pt said her URI sxs are starting to get better, she has been taking the OTC meds Dr. Milinda Antis recommended and she is feeling better, however pt said since around xmas she has been seeing flickering lights and having "crazy dreams" that she can't stop thinking about during the day. Pt said she isn't hearing voices and she isn't having them during the day it's just when she goes to bed she starts seeing lights and then has a "crazy dream". No other sxs   I advise I would let PCP and Dr. Milinda Antis know and we will f/u with her

## 2021-12-17 NOTE — Telephone Encounter (Signed)
Can you please call this patient and get her scheduled with Dr. Para March on Monday at 3pm. I do not have override access to schedule her.

## 2021-12-17 NOTE — Telephone Encounter (Signed)
Natasha Woodard called ina dnwanted to know if the other test result came in yet due to shapale only told her 1 result and not both. And she is worried.

## 2021-12-20 ENCOUNTER — Encounter: Payer: Self-pay | Admitting: Family Medicine

## 2021-12-20 ENCOUNTER — Telehealth: Payer: Self-pay | Admitting: Cardiology

## 2021-12-20 NOTE — Telephone Encounter (Signed)
Spoke with patient who reports sob that in ongoing. She sounded sob over the phone. She stated she at kimchi that was "very salty" and gained 3 pounds overnight. She stated she takes furosemide 20 mg on Tuesdays, Thursday, and Saturdays, but took one today because of the weight gain. Denies left/foot edema. We discussed low sodium diet. She said she is throwing out the rest of the kimchi. Her main concern is the ongoing sob. Please advise.

## 2021-12-20 NOTE — Telephone Encounter (Signed)
Spoke to patient .  She states she has been short of breath for last 3 months.She states she is up 4  to 5 lbs. She gets fatigue with activity. Patient states she is usually 170 lbs dry weight. She states per Dr Kirke Corin exercise for 30 min each day. Patient states she has not been doing that because she thought she had covid abut it turned out to be  a bad cold.  RN informed patient  if she is not able to do 30 minutes at one time. She can do it in  increments of 15 minutes twice a day or 10 min three times a day. Patient voice understanding, she thought she had to do all the exercise at one time. Patient has upcoming appointment with Dr Kirke Corin on 01/18/22   RN  also discuss with patient  about weight gain - take an extra dose of lasix 20 mg Tuesday , 20 mg Wednesday  and 20 mg Thursday  go back to regular schedule dosing.   Continue weigh herself .  Dr Herbie Baltimore aware.  Recall appointment schedule for 03/16/22 per patient request- ( wanting a late appointment)

## 2021-12-20 NOTE — Telephone Encounter (Signed)
Pt sent this via MyChart to the Scheduling pool:        ----- Message -----      From:Natasha Woodard      Sent:12/20/2021 12:16 PM EST        MD:[id number] Appointment Schedule Request Message List   Subject:Appointment Request  1. yes, if I understand the question 2. two months,  became more pronounced once I started exercise recommended by Dr. Fletcher Anon (30 minutes daily of walking on track and using a sit down machine using both arms and legs) 3. both sitting and moving around 4. I have to sit down once I complete a task (washing dishes, cooking, any and all household chores, unable to stand for long period (maybe 4 t0 6 minutes at a time) ( I have always had to hold onto rail   when walking on track I have to hold onto rail as well as using  walking stick thank you Natasha Woodard     Good Morning [name],  Can you tell me a little more about your SOB?     1. Are you currently SOB (can you hear that pt is SOB on the phone)?   2. How long have you been experiencing SOB?   3. Are you SOB when sitting or when up moving around?   4. Are you currently experiencing any other symptoms?     ----- Message -----  Reason for visit: Office Visit  Comments: shortness of breath and PAD

## 2021-12-21 ENCOUNTER — Ambulatory Visit (INDEPENDENT_AMBULATORY_CARE_PROVIDER_SITE_OTHER): Payer: Medicare Other | Admitting: Family Medicine

## 2021-12-21 ENCOUNTER — Other Ambulatory Visit: Payer: Self-pay

## 2021-12-21 ENCOUNTER — Encounter: Payer: Self-pay | Admitting: Family Medicine

## 2021-12-21 DIAGNOSIS — R21 Rash and other nonspecific skin eruption: Secondary | ICD-10-CM

## 2021-12-21 DIAGNOSIS — F515 Nightmare disorder: Secondary | ICD-10-CM | POA: Diagnosis not present

## 2021-12-21 MED ORDER — NYSTATIN-TRIAMCINOLONE 100000-0.1 UNIT/GM-% EX OINT: 1.0000 "application " | TOPICAL_OINTMENT | Freq: Two times a day (BID) | CUTANEOUS | 0 refills | Status: DC | PRN

## 2021-12-21 MED ORDER — FUROSEMIDE 20 MG PO TABS: ORAL_TABLET | ORAL | Status: DC

## 2021-12-21 MED ORDER — METFORMIN HCL 500 MG PO TABS: ORAL_TABLET | ORAL | Status: DC

## 2021-12-21 NOTE — Progress Notes (Signed)
This visit occurred during the SARS-CoV-2 public health emergency.  Safety protocols were in place, including screening questions prior to the visit, additional usage of staff PPE, and extensive cleaning of exam room while observing appropriate contact time as indicated for disinfecting solutions.  She is helping caring for her sister who has memory loss, d/w pt.  I thanked her for her effort.   She is taking extra lasix temporarily as instructed by cardiology.    She hasn't taken any extra tramadol yet, d/w pt.  A dose increase on that med isn't contributory to the issues below.    She had nightmares at night, she could see people during vivid dreams.  No daytimes sx.  No sx when awake at night.  She could wake up and sx would resolve.  All this was atypical for patient.    She had concurrent illness.  She had neg covid testing done here at clinic.  Husband tested positive for covid in the meantime.  Her sx started on 12/05/21. His sx started around the same time. She has also used claritin.    She fell on 12/03/21, at Natasha Woodard's, on a rubber mat.  She chipped a tooth and her R arm/wrist are still sore.  She had seen ortho clinic on 12/07/21.  She was xrayed per patient report and was given a sling.  Was told her xrays were neg.  Wrist is less puffy now.    She is sleeping better as of last night.  Her chest feels better and she clearly feels better.  She is using simply saline episodically.    Rash noted recently, see below.    Meds, vitals, and allergies reviewed.   ROS: Per HPI unless specifically indicated in ROS section   Nad [place]ck supple, no LA Ctab  Small amount of UAN noted.   rrr No BLE edema.   R axilla and R ankle with superficial skin irritation noted.  She has baseline changes from ichthyosis but this is different.  No fluctuant mass.  No ulceration.  33 minutes were devoted to patient care in this encounter (this includes time spent reviewing the patient's file/history,  interviewing and examining the patient, counseling/reviewing plan with patient).

## 2021-12-21 NOTE — Patient Instructions (Addendum)
Try arm swings in the meantime and see if that helps.  If not better then check with orthopedics.   Try using  triamcinolone/nystatin on the rash and let me know if that isn't helping.  I would avoid claritin.  It can rarely cause nightmares.    If your breathing isn't better with the extra doses of lasix, then let cardiology know.    Please schedule for a physical for the spring when possible.   Take care.  Glad to see you.

## 2021-12-22 ENCOUNTER — Telehealth: Payer: Self-pay | Admitting: Cardiology

## 2021-12-22 DIAGNOSIS — F515 Nightmare disorder: Secondary | ICD-10-CM | POA: Insufficient documentation

## 2021-12-22 NOTE — Assessment & Plan Note (Signed)
This looks like she has localized superficial irritation.  It could be fungal.  Reasonable to try nystatin triamcinolone and update me as needed.

## 2021-12-22 NOTE — Telephone Encounter (Signed)
Patient is requesting to speak with Ivin Booty, RN. She states [employer] informed her that they have not received all the information needed for her application. Specifically, they are needing the completed proof of income form 1040 and a copy of her insurance card (front and back). Please assist.

## 2021-12-22 NOTE — Assessment & Plan Note (Signed)
With concurrent illness.  Husband tested positive for COVID.  She had negative testing but I presume she had a false negative and had COVID.  Discussed.  That (or another viral illness) could have caused/contributed to her symptoms.  It is rare for Claritin to cause nightmares but that is theoretically possible.  It also could be the combination of the 2 contributed.  Either way she is feeling better now.  She has minimal upper airway noise but her lungs are clear.  I would avoid Claritin for now.  She is sleeping better now.  She will update me as needed.

## 2021-12-23 NOTE — Telephone Encounter (Signed)
Patient called back to speak to [name] about her application

## 2021-12-24 ENCOUNTER — Telehealth: Payer: Self-pay

## 2021-12-24 NOTE — Progress Notes (Signed)
° ° °  Chronic Care Management Pharmacy Assistant   Name: Natasha Woodard  MRN: [id number] DOB: 1941/08/16  Reason for Encounter: CCM (Appointment Reminder)   Medications: Outpatient Encounter Medications as of 12/24/2021  Medication Sig   acetaminophen (TYLENOL) 500 MG tablet Take 500 mg by mouth every 6 (six) hours as needed.   amiodarone (PACERONE) 200 MG tablet TAKE 1/2 TABLET BY MOUTH EVERY DAY   aspirin 81 MG tablet Take 1 tablet (81 mg total) by mouth daily.   carvedilol (COREG) 25 MG tablet TAKE 1 TABLET BY MOUTH TWICE DAILY   Cholecalciferol (VITAMIN D) 1000 UNITS capsule Take 1,000 Units by mouth daily.   empagliflozin (JARDIANCE) 10 MG TABS tablet TAKE 1/2 TABLET BY MOUTH DAILY BEFORE BREAKFAST   furosemide (LASIX) 20 MG tablet 1 tab every Tuesday Thursday and Saturday   glucose blood (ACCU-CHEK AVIVA PLUS) test strip USE TO TEST BLOOD SUGAR ONCE DAILY FOR DM E11.9   Lancets (ACCU-CHEK MULTICLIX) lancets USE ONE LANCET TO TEST BLOOD GLUCOSE DAILY FOR DM 250.00   metFORMIN (GLUCOPHAGE) 500 MG tablet TAKE 1/2 TABLET BY MOUTH DAILY WITH A MEAL   nystatin-triamcinolone ointment (MYCOLOG) Apply 1 application topically 2 (two) times daily as needed.   omeprazole (PRILOSEC) 20 MG capsule Take 1 capsule (20 mg total) by mouth daily.   rosuvastatin (CRESTOR) 10 MG tablet Take 1 tablet (10 mg total) by mouth daily.   sacubitril-valsartan (ENTRESTO) 97-103 MG Take 1 tablet by mouth 2 (two) times daily.   traMADol (ULTRAM) 50 MG tablet Take 1 tablet (50 mg total) by mouth 3 (three) times daily as needed.   No facility-administered encounter medications on file as of 12/24/2021.   Natasha Woodard was contacted to remind of upcoming telephone visit with Al Corpus on 12/31/2021 at 1:45 pm. Patient was reminded to have all medications, supplements and any blood glucose and blood pressure readings available for review at appointment. If unable to reach, a voicemail was left for patient.    Are you having any problems with your medications? No    Do you have any concerns you like to discuss with the pharmacist? Yes - Patient would like to know if any of the medications she is taking can induce nightmares. Patient would also like to know if any of the medications she is on can create memory loss. Patient was reading an article about it and would like to be assured none of her medications have that side effect.   Star Rating Drugs: Medication:    Last Fill: Day Supply Metformin 500 mg   08/10/2021 90    Sacubitril-Valsartan 97-103 mg Never picked up  Jardiance 10 mg   12/20/2021 30 Rosuvastatin 10 mg   12/21/2021 90 Fill dates verified with [facility]  Al Corpus, CPP notified  Claudina Lick, RMA Clinical Pharmacy Assistant [phone]  Time Spent: 10 Minutes

## 2021-12-27 ENCOUNTER — Telehealth: Payer: Self-pay | Admitting: Family Medicine

## 2021-12-27 DIAGNOSIS — M25511 Pain in right shoulder: Secondary | ICD-10-CM | POA: Diagnosis not present

## 2021-12-27 MED ORDER — NYSTATIN 100000 UNIT/GM EX CREA: 1.0000 "application " | TOPICAL_CREAM | Freq: Two times a day (BID) | CUTANEOUS | 0 refills | Status: DC

## 2021-12-27 NOTE — Telephone Encounter (Signed)
Natasha Woodard is calling back due to [name] not returning her call. Advised her she is not in office today, but this message is being sent so she will be called back when she returns.

## 2021-12-27 NOTE — Telephone Encounter (Signed)
I was working on the paper [place] [employer] rx sent and they will deny it since patient has not tried and failed other medications in the past. Something else will need to be sent in for patient

## 2021-12-27 NOTE — Telephone Encounter (Signed)
Patient has been advised and she was able to pick up the other cream and this one. She paid out of pocket for the original rx.

## 2021-12-27 NOTE — Telephone Encounter (Signed)
Dewayne Hatch called in from [facility] [place] and they sent a form on 1/14 for the pa and they are needing an answer within 24hr or its an automatic denial.

## 2021-12-27 NOTE — Addendum Note (Signed)
Addended by: [name] on: 12/27/2021 02:03 PM   Modules accepted: Orders

## 2021-12-27 NOTE — Telephone Encounter (Signed)
Please notify pt about the denial.  She can try plain nystatin cream, rx sent. If still with local irritation, she can use OTC hydrocortisone cream BID prn.  She can put that on at the same time.  Let me know if that combination doesn't work. Thanks.

## 2021-12-31 ENCOUNTER — Telehealth: Payer: Medicare Other

## 2021-12-31 ENCOUNTER — Telehealth: Payer: Self-pay | Admitting: Pharmacist

## 2021-12-31 NOTE — Telephone Encounter (Signed)
°  Chronic Care Management   Outreach Note  12/31/2021 Name: Natasha Woodard MRN: [id number] DOB: 1941-07-29  Referred by: [name], MD  Patient had a phone appointment scheduled with clinical pharmacist today.  An unsuccessful telephone outreach was attempted today. The patient was referred to the pharmacist for assistance with medications, care management and care coordination.   Patient will NOT be penalized in any way for missing a CCM appointment. The no-show fee does not apply.  If possible, a message was left to return call to: [phone] or to [facility].  Al Corpus, PharmD, BCACP Clinical Pharmacist [facility] [phone]

## 2021-12-31 NOTE — Progress Notes (Deleted)
Chronic Care Management Pharmacy Note  12/31/2021 Name:  Natasha Woodard MRN:  [id number] DOB:  Jan 22, 1941  Summary: -BP at home (checked before meds in AM) has been high the past week (154/82-174/97); she denies headaches, dizziness, confusion; discussed dangers of high BP at length -Pt endorses compliance with new rosuvastatin 10 mg, she has stopped lovastatin  Recommendations/Changes made from today's visit: -Recommended adding hydralazine 25 mg TID - pt declined as she is very reluctant to add more meds, she wants to work on diet changes/wt loss to improve BP. I advised office visit to see PCP which she also declined due to distance to [place]. -Pt to check BP twice daily (after meds in AM, before meds at night) for 2 weeks  Plan: -[facility] will call patient in 2 weeks for BP readings/med adherence -Pharmacist follow up televisit scheduled for 1 month -Cardiology appt 01/18/22   Subjective: Natasha Woodard is an 81 y.o. year old female who is a primary patient of Damita Dunnings, Elveria Rising, MD.  The CCM team was consulted for assistance with disease management and care coordination needs.    Engaged with patient by telephone for follow up visit in response to provider referral for pharmacy case management and/or care coordination services.   Consent to Services:  The patient was given information about Chronic Care Management services, agreed to services, and gave verbal consent prior to initiation of services.  Please see initial visit note for detailed documentation.   Patient Care Team: Tonia Ghent, MD as PCP - General (Family Medicine) Leonie Man, MD as PCP - Cardiology (Cardiology) Bensimhon, Shaune Pascal, MD as PCP - Advanced Heart Failure (Cardiology) Wellington Hampshire, MD as PCP - [facility] Cardiology (Cardiology) Hortencia Pilar, MD as Consulting Physician (Ophthalmology) Charlton Haws, [facility] as Pharmacist (Pharmacist)   Recent office  visits: 12/21/21 Dr Damita Dunnings OV: c/o nightmares, in s/o of viral illness. Feeling better now. Advised avoiding claritin (can rarely cause nightmares); Rx'd Nystatin-triamcinolone for rash. BP 140/68.  12/14/21 Dr Glori Bickers VV: viral URI; advised covid test 08/19/2021 - Elsie Stain, MD - Patient presented for follow up Diabetes. Labs: A1c. Change: metFORMIN (GLUCOPHAGE) 500 MG tablet. Take half tablet by mouth daily with meal (previously twice a day).   04/29/2021 -  Elsie Stain, MD - Patient presented for cough. Start: benzonatate (TESSALON) 200 MG capsule.  04/06/2021 - Elsie Stain, MD - [place] presented for Annual Wellness Visit. Stop: omeprazole (PRILOSEC) 20 MG capsule as no longer needed. Labs: CXR and EKG. Start: OTC Zyrtec.   Recent consult visits: 09/14/2021 - Kathlyn Sacramento, MD - Cardiology - Patient presented for evaluation management of peripheral arterial disease. No medication changes.    09/09/2021 - Glenetta Hew, MD - Cardiology - Patient presented for follow up on Hyperlipidemia. Labs. EKG. Referral for Peripheral Vascular Consult. suspect now that we have identified what appears to be occlusive PAD, would probably want to convert from lovastatin to likely rosuvastatin.  We can address this in follow-up.  09/09/2021 - [facility] - Patient presented for [facility] Arterial. No other information.  04/27/2021 - [facility] - Patient presented for with cough, chest congestion.  03/31/2021 - Glori Bickers - Cardiology - Patient presented for congestive heart failure. No medication changes.   Hospital visits: None in previous 6 months   Objective:  Lab Results  Component Value Date   CREATININE 1.13 03/19/2021   BUN 17 03/19/2021   GFR 45.93 (L) 03/19/2021  GFRNONAA 40 (L) 10/10/2020   GFRAA 49 (L) 10/09/2020   NA 142 03/19/2021   K 4.7 03/19/2021   CALCIUM 9.3 03/19/2021   CO2 30 03/19/2021   GLUCOSE 88 03/19/2021    Lab Results  Component Value  Date/Time   HGBA1C 6.2 (A) 08/19/2021 11:19 AM   HGBA1C 6.6 (H) 03/19/2021 11:21 AM   HGBA1C 6.0 (A) 12/25/2020 02:33 PM   HGBA1C 6.3 (H) 10/09/2020 09:35 AM   GFR 45.93 (L) 03/19/2021 11:21 AM   GFR 52.41 (L) 11/12/2019 09:21 AM    Last diabetic Eye exam:  Lab Results  Component Value Date/Time   HMDIABEYEEXA No Retinopathy 10/06/2020 12:00 AM   HMDIABEYEEXA No Retinopathy 10/06/2020 12:00 AM    Last diabetic Foot exam:  Lab Results  Component Value Date/Time   HMDIABFOOTEX yes 01/25/2011 12:00 AM     Lab Results  Component Value Date   CHOL 141 03/19/2021   HDL 42.10 03/19/2021   LDLCALC 81 03/19/2021   LDLDIRECT 53 08/10/2009   TRIG 89.0 03/19/2021   CHOLHDL 3 03/19/2021    Hepatic Function Latest Ref Rng & Units 03/19/2021 10/10/2020 10/09/2020  Total Protein 6.0 - 8.3 g/dL 6.5 6.1(L) 6.5  Albumin 3.5 - 5.2 g/dL 3.9 3.5 4.1  AST 0 - 37 U/L 12 19 12   ALT 0 - 35 U/L 9 6 7   Alk Phosphatase 39 - 117 U/L 56 58 65  Total Bilirubin 0.2 - 1.2 mg/dL 0.5 0.7 0.3  Bilirubin, Direct 0.00 - 0.40 mg/dL - - -    Lab Results  Component Value Date/Time   TSH 1.23 03/19/2021 11:21 AM   TSH 2.148 04/17/2020 03:08 PM   TSH 1.87 11/12/2019 09:21 AM   FREET4 1.32 (H) 04/17/2020 03:08 PM   FREET4 1.28 10/08/2018 11:14 AM    CBC Latest Ref Rng & Units 03/19/2021 10/09/2020 10/10/2019  WBC 4.0 - 10.5 K/uL 4.3 3.9 3.7(L)  Hemoglobin 12.0 - 15.0 g/dL 13.6 14.2 12.9  Hematocrit 36.0 - 46.0 % 41.8 44.9 41.3  Platelets 150.0 - 400.0 K/uL 333.0 379 302    Lab Results  Component Value Date/Time   VD25OH 60 07/23/2013 10:13 AM    Clinical ASCVD: Yes  - PAD The ASCVD Risk score (Arnett DK, et al., 2019) failed to calculate for the following reasons:   The 2019 ASCVD risk score is only valid for ages 18 to 38    Depression screen PHQ 2/9 04/08/2021 11/12/2019 11/15/2018  Decreased Interest 0 0 0  Down, Depressed, Hopeless 0 0 0  PHQ - 2 Score 0 0 0  Altered sleeping 0 0 -  Tired,  decreased energy 0 0 -  Change in appetite 0 0 -  Feeling bad or failure about yourself  0 0 -  Trouble concentrating 0 0 -  Moving slowly or fidgety/restless 0 0 -  Suicidal thoughts 0 0 -  PHQ-9 Score 0 0 -  Difficult doing work/chores Not difficult at all Not difficult at all -  Some recent data might be hidden     Social History   Tobacco Use  Smoking Status Former   Years: 28.00   Types: Cigarettes   Quit date: 12/12/1993   Years since quitting: 28.0  Smokeless Tobacco Never   BP Readings from Last 3 Encounters:  12/21/21 140/68  12/14/21 117/63  10/14/21 135/68   Pulse Readings from Last 3 Encounters:  12/21/21 65  09/14/21 68  09/09/21 (!) 58   Wt Readings from Last  3 Encounters:  12/21/21 177 lb (80.3 kg)  09/14/21 172 lb 9.6 oz (78.3 kg)  09/09/21 171 lb 12.8 oz (77.9 kg)   BMI Readings from Last 3 Encounters:  12/21/21 32.37 kg/m  09/14/21 31.57 kg/m  09/09/21 31.42 kg/m    Assessment/Interventions: Review of patient past medical history, allergies, medications, health status, including review of consultants reports, laboratory and other test data, was performed as part of comprehensive evaluation and provision of chronic care management services.   SDOH:  (Social Determinants of Health) assessments and interventions performed: Yes  SDOH Screenings   Alcohol Screen: Not on file  Depression (PHQ2-9): Low Risk    PHQ-2 Score: 0  Financial Resource Strain: Not on file  Food Insecurity: Not on file  Housing: Not on file  Physical Activity: Not on file  Social Connections: Not on file  Stress: Not on file  Tobacco Use: Medium Risk   Smoking Tobacco Use: Former   Smokeless Tobacco Use: Never   Passive Exposure: Not on file  Transportation Needs: Not on file    CCM Care Plan  Allergies  Allergen Reactions   Ezetimibe-Simvastatin Other (See Comments)    REACTION: Muscle aches (side effect)   Lipitor [Atorvastatin] Other (See Comments)    Leg  weakness    Claritin [Loratadine]     Possible cause of nightmares.     Tamiflu [Oseltamivir Phosphate] Other (See Comments)    nightmares   Pravastatin Sodium Other (See Comments)    REACTION: Muscle aches (side effect)   Sulfonamide Derivatives Nausea And Vomiting    Medications Reviewed Today     Reviewed by Tonia Ghent, MD (Physician) on 12/21/21 at [place] List Status: <None>   Medication Order Taking? Sig Documenting Provider Last Dose Status Informant  acetaminophen (TYLENOL) 500 MG tablet [id number] Yes Take 500 mg by mouth every 6 (six) hours as needed. [provider] Taking Active   amiodarone (PACERONE) 200 MG tablet [id number] Yes TAKE 1/2 TABLET BY MOUTH EVERY DAY Leonie Man, MD Taking Active   aspirin 81 MG tablet [id number] Yes Take 1 tablet (81 mg total) by mouth daily. Tonia Ghent, MD Taking Active   carvedilol (COREG) 25 MG tablet [id number] Yes TAKE 1 TABLET BY MOUTH TWICE DAILY Tonia Ghent, MD Taking Active   Cholecalciferol (VITAMIN D) 1000 UNITS capsule [id number] Yes Take 1,000 Units by mouth daily. [provider] Taking Active Self           Med Note Fleet Contras Sep 14, 2017  6:33 AM)    empagliflozin (JARDIANCE) 10 MG TABS tablet [id number] Yes TAKE 1/2 TABLET BY MOUTH DAILY BEFORE BREAKFAST Leonie Man, MD Taking Active   furosemide (LASIX) 20 MG tablet [id number] Yes 1 tab every Tuesday Thursday and Saturday Tonia Ghent, MD Taking Active   glucose blood (ACCU-CHEK AVIVA PLUS) test strip [id number] Yes USE TO TEST BLOOD SUGAR ONCE DAILY FOR DM E11.9 Tonia Ghent, MD Taking Active   Lancets (ACCU-CHEK MULTICLIX) lancets [id number] Yes USE ONE LANCET TO TEST BLOOD GLUCOSE DAILY FOR DM 250.00 Tonia Ghent, MD Taking Active   metFORMIN (GLUCOPHAGE) 500 MG tablet [id number] Yes TAKE 1/2 TABLET BY MOUTH DAILY WITH A MEAL Tonia Ghent, MD Taking Active   omeprazole (PRILOSEC) 20 MG capsule [id number] Yes  Take 1 capsule (20 mg total) by mouth daily. Tonia Ghent, MD Taking Active   rosuvastatin (CRESTOR) 10 MG tablet [id number] Yes  Take 1 tablet (10 mg total) by mouth daily. Tonia Ghent, MD Taking Active   sacubitril-valsartan [facility]) 97-103 MG [id number] Yes Take 1 tablet by mouth 2 (two) times daily. Leonie Man, MD Taking Active   traMADol Veatrice Bourbon) 50 MG tablet [id number] Yes Take 1 tablet (50 mg total) by mouth 3 (three) times daily as needed. Tonia Ghent, MD Taking Active             Patient Active Problem List   Diagnosis Date Noted   Nightmare 12/22/2021   Viral URI with cough 12/14/2021   Onychomycosis of multiple toenails with type 2 diabetes mellitus and peripheral angiopathy ([place]) 11/16/2021   Pain due to onychomycosis of toenail of left foot 11/16/2021   Intermittent claudication of both lower extremities due to atherosclerosis ([place]) 08/22/2021   Ganglion cyst of wrist 08/22/2021   Rash 02/17/2021   On amiodarone therapy 08/21/2020   Hyperkalemia 08/21/2020   CKD (chronic kidney disease) stage 3, GFR 30-59 ml/min (HCC) 09/25/2019   Gastroesophageal reflux disease 06/19/2019   Abdominal pain 02/06/2019   Health care maintenance 11/14/2018   Joint pain 08/07/2018   Cough 06/17/2018   Sensorineural hearing loss (SNHL), bilateral 03/20/2018   Complex sleep apnea syndrome 10/05/2017   Chronic heart failure with preserved ejection fraction (HFpEF) ([place]) 09/08/2017   Dilated cardiomyopathy ([place]) 06/30/2017   Frequent PVCs 06/30/2017   Advance care planning 08/19/2014   Medicare annual wellness visit, subsequent 07/30/2013   KNEE PAIN, LEFT 11/09/2010   Type II diabetes mellitus with peripheral artery disease ([place]) 05/19/2010   HLD (hyperlipidemia) 05/19/2010   Essential hypertension 05/19/2010   EMPHYSEMA 05/19/2010   HIATAL HERNIA WITH REFLUX 05/19/2010   Ichthyosis 05/19/2010    Immunization History  Administered Date(s) Administered   Fluad  Quad(high Dose 65+) 08/19/2021   Influenza Whole 08/12/2010   Influenza,inj,Quad PF,6+ Mos 08/19/2014, 08/15/2015, 09/02/2016, 08/05/2019   Influenza-Unspecified 09/11/2018   PFIZER Comirnaty(Gray Top)Covid-19 Tri-Sucrose Vaccine 03/23/2021   PFIZER(Purple Top)SARS-COV-2 Vaccination 02/09/2020, 02/29/2020, 09/15/2020   Pneumococcal Conjugate-13 03/10/2015   Pneumococcal Polysaccharide-23 05/26/2006   Td 06/14/2007   Zoster Recombinat (Shingrix) 08/12/2020, 10/12/2020   Zoster, Live 08/10/2006    Conditions to be addressed/monitored:  Hypertension, Hyperlipidemia, Diabetes, Heart Failure, and Chronic Kidney Disease, PAD  There are no care plans that you recently modified to display for this patient.      Medication Assistance:  -Delene Loll - [employer] PAP approved through cardiology until 12/11/21 -Jardiance - [employer] Cares PAP in process; forms faxed from cardiology office 11/08/21  Compliance/Adherence/Medication fill history: Care Gaps: None  Star-Rating Drugs: Metformin 531m                                08/09/2021      90                                 Lovastatin 454m                                09/04/2021      90                                 Jardiance 1075m  09/17/2021      30  Patient's preferred pharmacy is:  [facility] [place], [place] - [address] AT [address] [place] [address] Phone: [phone] Fax: [phone]  [facility], [place] [address] [place] [address] Phone: [phone] Fax: [phone]   Uses pill box? Yes - 30 day pill box Pt endorses 100% compliance  We discussed: Current pharmacy is preferred with insurance plan and patient is satisfied with pharmacy services Patient decided to: Continue current medication management strategy  Care Plan and Follow Up Patient Decision:  Patient  agrees to Care Plan and Follow-up.  Plan: Telephone follow up appointment with care management team member scheduled for:  1 month  Charlene Brooke, PharmD, [employer], CPP Clinical Pharmacist [facility] [phone]   Current Barriers:  Unable to independently monitor therapeutic efficacy Unable to maintain control of HTN  Pharmacist Clinical Goal(s):  Patient will achieve adherence to monitoring guidelines and medication adherence to achieve therapeutic efficacy adhere to plan to optimize therapeutic regimen for HTN as evidenced by report of adherence to recommended medication management changes through collaboration with PharmD and provider.   Interventions: 1:1 collaboration with Tonia Ghent, MD regarding development and update of comprehensive plan of care as evidenced by provider attestation and co-signature Inter-disciplinary care team collaboration (see longitudinal plan of care) Comprehensive medication review performed; medication list updated in electronic medical record  Hyperlipidemia / PAD (LDL goal < 70) -Not ideally controlled - pt endorses compliance with new rosuvastatin (changed last visit from lovastatin due to leg cramps/LDL not at goal); -New occlusive PAD Sept 2022; considering Xarelto 2.5 mg, rosuvastatin w/ cardiologist -Current treatment: Rosuvastatin 10 mg daily Aspirin 81 mg daily -Previously tried/failed meds: lovastatin -[facility] -Educated on Cholesterol goals; Benefits of statin for ASCVD risk reduction; Strategies to manage statin-induced myalgias; benefits of aspirin in PAD -Counseled to continued daily exercise routine -Recommend to continue current medication; repeat lipid panel in 8-12 weeks  Diabetes (A1c goal <7%) -Controlled - A1c is at goal; pt normally checked BG once a week but has checked daily over past week in preparation for this appt; she endorses compliance with medications  as below; she is working on pt assistance for [employer] with cardiology  -Current home glucose readings fasting glucose: 94-102 -Current medications: Metformin 500 mg - 1/2 tab daily Jardiance 10 mg - 1/2 tab daily Testing supplies -Denies hypoglycemic/hyperglycemic symptoms -Educated on A1c and blood sugar goals; Complications of diabetes including kidney damage, retinal damage, and cardiovascular disease; benefits of metformin and Jardiance for DM and CV protection -Recommend to continue current medication  Heart Failure / Hypertension (Goal: BP < 140/90) -Not ideally controlled - pt endorses compliance with medications as prescribed; she has been checking BP in AM before meds for the past week and readings have been elevated; she also reports she has trouble exercising after taking her BP meds but feels better if she exercises before taking meds -Current home BP/HR readings:  12/5: 178/77 12/6: 160/77 12/7: 172/83 12/8: 198/102 154/82 12/9: 174/97 155/80 -Hx frequent PVCs controlled with amiodarone -Last ejection fraction: 60-65% (Date: 12/25/19) -HF type: Diastolic; NYHA Class: I (no actitivty limitation) -Current treatment: Carvedilol 25 mg BID Furosemide 20 mg MWF PRN Entresto 97-103 mg BID Amiodarone 200 mg - 1/2 tab daily Jardiance 10 mg -1/2 tab daily -Educated on Benefits of  medications for managing symptoms and prolonging life; Importance of blood pressure control; proper BP monitoring technique -Pt is taking 2 BID medications (Entresto, carvedilol) and she reports very rarely missing PM doses - these PM doses should be controlling BP overnight and into morning, so BP before AM doses should not be significantly higher than other times of day; concern for persistently elevated BP on a daily basis -Counseled on beta blocker side effects including exercise intolerance; it is likely that carvedilol is contributing to her early fatigue while walking; advised she can exercise early  in AM and take carvedilol afterward -Recommended adding hydralazine 25 mg TID; pt is resistant to adding a new medication and wanted time to focus on diet changes to improve BP; will defer med changes 1 month -Recommended to check BP twice daily - AM and bedtime  Health Maintenance -Vaccine gaps: TDAP, Covid booster -Counseled to get bivalent covid booster at [facility]; discussed benefits/risks of TDAP booster, pt is low risk for TD and would like to forego 10-year booster -Current therapy:  Vitamin D 1000 IU daily Tramadol 50 mg BID prn Tylenol 500 mg  -Patient is satisfied with current therapy and denies issues -Recommended to continue current medication  Patient Goals/Self-Care Activities Patient will:  - take medications as prescribed -focus on medication adherence by pill box -check blood pressure twice daily (AM and bedtime) -collaborate with provider on medication access solutions

## 2022-01-07 NOTE — Progress Notes (Signed)
Appointment has been rescheduled. ° ° °Lindsey Foltanski, CPP notified ° °Lindsay Saintsing, CMA °Clinical Pharmacy Assistant °[phone] ° °

## 2022-01-10 ENCOUNTER — Other Ambulatory Visit: Payer: Self-pay | Admitting: Orthopedic Surgery

## 2022-01-10 DIAGNOSIS — M75101 Unspecified rotator cuff tear or rupture of right shoulder, not specified as traumatic: Secondary | ICD-10-CM

## 2022-01-10 DIAGNOSIS — M25511 Pain in right shoulder: Secondary | ICD-10-CM | POA: Diagnosis not present

## 2022-01-12 ENCOUNTER — Telehealth: Payer: Self-pay | Admitting: Family Medicine

## 2022-01-12 ENCOUNTER — Other Ambulatory Visit (HOSPITAL_COMMUNITY): Payer: Self-pay | Admitting: Internal Medicine

## 2022-01-12 NOTE — Telephone Encounter (Signed)
This is Dr. Ellyn Hack pt now. Please address

## 2022-01-12 NOTE — Telephone Encounter (Signed)
°  Encourage patient to contact the pharmacy for refills or they can request refills through [facility]  LAST APPOINTMENT DATE:  Please schedule appointment if longer than 1 year  NEXT APPOINTMENT DATE:  MEDICATION:JARDIANCE 10 MG TABS tablet  Is the patient out of medication?   PHARMACY:[facility] [phone] - [place], [place] - [address] AT [facility] [address]  Let patient know to contact pharmacy at the end of the day to make sure medication is ready.  Please notify patient to allow 48-72 hours to process  CLINICAL FILLS OUT ALL BELOW:   LAST REFILL:  QTY:  REFILL DATE:    OTHER COMMENTS:    Okay for refill?  Please advise

## 2022-01-12 NOTE — Telephone Encounter (Signed)
Looks like Dr. Herbie Baltimore sent this rx in to the pharmacy today per EMR. Called and left message for patient that this was done.

## 2022-01-13 ENCOUNTER — Telehealth: Payer: Self-pay | Admitting: Cardiology

## 2022-01-13 NOTE — Telephone Encounter (Signed)
°  Per MyChart scheduling message:   i am concerned about my  increase in shortness of breath which is more pronounced and  the loud wheezing even when lying down  Pt c/o Shortness Of Breath: STAT if SOB developed within the last 24 hours or pt is noticeably SOB on the phone  1. Are you currently SOB (can you hear that pt is SOB on the phone)?   2. How long have you been experiencing SOB?   3. Are you SOB when sitting or when up moving around?   4. Are you currently experiencing any other symptoms?   1. unsure  what the question is, I am the patient;  are you asking if I can hear myself SOB on the phone, if I understand the question I nthink that others could hear my SOB 2. I've been experiencing the SOB for about  month now 3. both when walking up 14 flights of stairs  I have to immediately to sit down; however, [facility]'m [facility] w/out any problems 4.I have wheezing really badly and oud while lying down

## 2022-01-13 NOTE — Telephone Encounter (Signed)
Spoke with pt, she denies any edema or orthopnea or weight gain. She mainly gets SOB with walking up stairs and walking to the mailbox. She reports her husband had covid about 2-3 weeks ago but she tested negative. She does not have much of a cough or congestion but she does have wheezing. Explained to patient that does not sound like heart failure and she would need to contact her medical doctor. Pt agreed with this plan.

## 2022-01-14 ENCOUNTER — Ambulatory Visit (INDEPENDENT_AMBULATORY_CARE_PROVIDER_SITE_OTHER)
Admission: RE | Admit: 2022-01-14 | Discharge: 2022-01-14 | Disposition: A | Discharge status: Home / Self Care | Payer: Medicare Other | Source: Ambulatory Visit | Attending: Family Medicine | Admitting: Family Medicine

## 2022-01-14 ENCOUNTER — Ambulatory Visit (INDEPENDENT_AMBULATORY_CARE_PROVIDER_SITE_OTHER): Payer: Medicare Other | Admitting: Family Medicine

## 2022-01-14 ENCOUNTER — Other Ambulatory Visit: Payer: Self-pay

## 2022-01-14 ENCOUNTER — Encounter: Payer: Self-pay | Admitting: Family Medicine

## 2022-01-14 VITALS — BP 122/82 | HR 58 | Temp 96.4°F | Ht 62.0 in | Wt 177.0 lb

## 2022-01-14 DIAGNOSIS — R059 Cough, unspecified: Secondary | ICD-10-CM | POA: Diagnosis not present

## 2022-01-14 DIAGNOSIS — R0600 Dyspnea, unspecified: Secondary | ICD-10-CM | POA: Diagnosis not present

## 2022-01-14 DIAGNOSIS — E1151 Type 2 diabetes mellitus with diabetic peripheral angiopathy without gangrene: Secondary | ICD-10-CM | POA: Diagnosis not present

## 2022-01-14 MED ORDER — PREDNISONE 10 MG PO TABS: ORAL_TABLET | ORAL | 0 refills | Status: DC

## 2022-01-14 NOTE — Patient Instructions (Signed)
Go to the lab on the way out.   If you have mychart we'll likely use that to update you.     Start taking 1 prednisone tab in the AM right after breakfast.   Update me Monday about your breathing.   Take care.  Glad to see you.

## 2022-01-14 NOTE — Progress Notes (Signed)
This visit occurred during the SARS-CoV-2 public health emergency.  Safety protocols were in place, including screening questions prior to the visit, additional usage of staff PPE, and extensive cleaning of exam room while observing appropriate contact time as indicated for disinfecting solutions.  Her sister has dementia and she is helping out care for her.  D/w pt. I thanked her for her effort.  Wheeze.  No pain.  She has SOB with exercise and dec exercise tolerance since the prev illness.  She can hear a change with her breathing but she can still take a deep breath.  Noted since she her husband had covid.  She tested negative but but she had symptoms at the time.  It is not getting better per patient report but other sx from the illness are better.  No fevers.  No sputum.  Some occ cough, but dry. No chest pain.  Fall cautions d/w pt.    Sugars been approximately 100 on home checks per patient report.  Meds, vitals, and allergies reviewed.  ROS: Per HPI unless specifically indicated in ROS section   Nad [place] supple no LA CTAB but she has some mild exp UAN (exp more then insp). Rrr Abd soft, not ttp Skin well perfused.  She has chronic skin changes at baseline No BLE edema.

## 2022-01-15 LAB — COMPREHENSIVE METABOLIC PANEL
AG Ratio: 1.6 (calc) (ref 1.0–2.5)
ALT: 18 U/L (ref 6–29)
AST: 17 U/L (ref 10–35)
Albumin: 4.1 g/dL (ref 3.6–5.1)
Alkaline phosphatase (APISO): 59 U/L (ref 37–153)
BUN/Creatinine Ratio: 13 (calc) (ref 6–22)
BUN: 17 mg/dL (ref 7–25)
CO2: 30 mmol/L (ref 20–32)
Calcium: 9.6 mg/dL (ref 8.6–10.4)
Chloride: 107 mmol/L (ref 98–110)
Creat: 1.28 mg/dL — ABNORMAL HIGH (ref 0.60–0.95)
Globulin: 2.5 g/dL (calc) (ref 1.9–3.7)
Glucose, Bld: 89 mg/dL (ref 65–99)
Potassium: 4.9 mmol/L (ref 3.5–5.3)
Sodium: 144 mmol/L (ref 135–146)
Total Bilirubin: 0.7 mg/dL (ref 0.2–1.2)
Total Protein: 6.6 g/dL (ref 6.1–8.1)

## 2022-01-15 LAB — CBC WITH DIFFERENTIAL/PLATELET
Absolute Monocytes: 443 cells/uL (ref 200–950)
Basophils Absolute: 39 cells/uL (ref 0–200)
Basophils Relative: 0.9 %
Eosinophils Absolute: 219 cells/uL (ref 15–500)
Eosinophils Relative: 5.1 %
HCT: 39.9 % (ref 35.0–45.0)
Hemoglobin: 13.5 g/dL (ref 11.7–15.5)
Lymphs Abs: 1152 cells/uL (ref 850–3900)
MCH: 31.9 pg (ref 27.0–33.0)
MCHC: 33.8 g/dL (ref 32.0–36.0)
MCV: 94.3 fL (ref 80.0–100.0)
MPV: 10.2 fL (ref 7.5–12.5)
Monocytes Relative: 10.3 %
Neutro Abs: 2447 cells/uL (ref 1500–7800)
Neutrophils Relative %: 56.9 %
Platelets: 287 10*3/uL (ref 140–400)
RBC: 4.23 10*6/uL (ref 3.80–5.10)
RDW: 13.1 % (ref 11.0–15.0)
Total Lymphocyte: 26.8 %
WBC: 4.3 10*3/uL (ref 3.8–10.8)

## 2022-01-15 LAB — HEMOGLOBIN A1C
Hgb A1c MFr Bld: 6.4 % of total Hgb — ABNORMAL HIGH (ref <5.7)
Mean Plasma Glucose: 137 mg/dL
eAG (mmol/L): 7.6 mmol/L

## 2022-01-15 LAB — BRAIN NATRIURETIC PEPTIDE: Brain Natriuretic Peptide: 1333 pg/mL — ABNORMAL HIGH (ref <100)

## 2022-01-17 NOTE — Assessment & Plan Note (Signed)
Occasional cough but she was more troubled by her breath sounds.  Her lungs sound clear but it does sound like she has upper airway noise that is expiratory greater than inspiratory.  Discussed options.  Still okay for outpatient follow-up.  It seems like this started since she had a COVID exposure.  Her husband tested positive but she tested negative.  She does not appear acutely ill.  She is speaking in complete sentences.  Okay for outpatient follow-up.  Reasonable to use a low-dose of prednisone in the meantime, check routine labs, and go from there.  She agrees with plan.  Discussed plan in detail with patient.

## 2022-01-18 ENCOUNTER — Ambulatory Visit: Payer: Medicare Other | Admitting: Cardiovascular Disease

## 2022-01-18 ENCOUNTER — Other Ambulatory Visit: Payer: Self-pay

## 2022-01-18 ENCOUNTER — Encounter: Payer: Self-pay | Admitting: Cardiovascular Disease

## 2022-01-18 VITALS — BP 124/76 | HR 71 | Resp 20 | Ht 62.0 in | Wt 177.6 lb

## 2022-01-18 DIAGNOSIS — I5022 Chronic systolic (congestive) heart failure: Secondary | ICD-10-CM

## 2022-01-18 DIAGNOSIS — I739 Peripheral vascular disease, unspecified: Secondary | ICD-10-CM | POA: Diagnosis not present

## 2022-01-18 DIAGNOSIS — E785 Hyperlipidemia, unspecified: Secondary | ICD-10-CM | POA: Diagnosis not present

## 2022-01-18 DIAGNOSIS — I493 Ventricular premature depolarization: Secondary | ICD-10-CM

## 2022-01-18 NOTE — Patient Instructions (Signed)
Medication Instructions:  °No changes °*If you need a refill on your cardiac medications before your next appointment, please call your pharmacy* ° ° °Lab Work: °None ordered °If you have labs (blood work) drawn today and your tests are completely normal, you will receive your results only by: °MyChart Message (if you have MyChart) OR °A paper copy in the mail °If you have any lab test that is abnormal or we need to change your treatment, we will call you to review the results. ° ° °Testing/Procedures: °None ordered ° ° °Follow-Up: °At [facility], you and your health needs are our priority.  As part of our continuing mission to provide you with exceptional heart care, we have created designated Provider Care Teams.  These Care Teams include your primary Cardiologist (physician) and Advanced Practice Providers (APPs -  Physician Assistants and Nurse Practitioners) who all work together to provide you with the care you need, when you need it. ° °We recommend signing up for the patient portal called "MyChart".  Sign up information is provided on this After Visit Summary.  MyChart is used to connect with patients for Virtual Visits (Telemedicine).  Patients are able to view lab/test results, encounter notes, upcoming appointments, etc.  Non-urgent messages can be sent to your provider as well.   °To learn more about what you can do with MyChart, go to [email or web address].   ° °Your next appointment:   °6 month(s) ° °The format for your next appointment:   °In Person ° °Provider:   °Dr. Arida ° °

## 2022-01-18 NOTE — Progress Notes (Signed)
Cardiology Office Note   Date:  01/18/2022   ID:  Natasha Woodard [birth year], MRN [id number]  PCP:  Tonia Ghent, MD  Cardiologist:  Dr. Ellyn Hack  No chief complaint on file.     History of Present Illness: Natasha Woodard is a 81 y.o. female who is here today for follow-up visit regarding peripheral arterial disease.   She has known history of chronic systolic heart failure due to nonischemic cardiomyopathy with normalization of LV systolic function, PVCs, mild to moderate nonobstructive coronary artery disease, diabetes mellitus, essential hypertension, stage III chronic kidney disease, hyperlipidemia with statin intolerance and  peripheral arterial disease.  She quit smoking more than 30 years ago.  She was seen recently for moderate bilateral calf claudication due to bilateral SFA occlusion with moderately reduced ABI.  I recommended starting an exercise program.  She seems to be limited by shortness of breath and claudication. She reports bilateral calf discomfort after walking about 1 block.  She did some exercise but not on a regular basis.  She feels it is easier to exercise on a machine than on the track.   Past Medical History:  Diagnosis Date   Calcific tendonitis 2008   Treatment of left leg   Cardiomyopathy ([place]) 06/2017   a) Echo: EF 25-30%. GR 1-2 DD w/ elevated LVEDP. Mild valvular Dz; b) Cardiac MRI 2/'19: frequent PVCs (diffiuclt to interpret) - EF ~27% w/ diffuse HK.  No evidence of infarct, infiltrative Dz or myocarditis. -- ? if related to PVCs. c) f/u Echo 6/'19: EF 35-40%. Gr 1 DD. Diffuse HK.-> d) Jan 2020 EF 50 to 55%.  Mod MAC, mod LAE.  Ao Sclerosis; e) Echo 1/'21 - EF 60-65%, Gr II DD. Mild Mod MR.    Chronic combined systolic and diastolic CHF, NYHA class 2 and ACA/AHA stage C    Now essentially resolved, back to simply diastolic CHF   Coronary artery disease, non-occlusive    mild-moderate CAD 06/30/17 cath   CTS (carpal tunnel syndrome)     Diabetes mellitus    Type II   Dysrhythmia    patient said that she cant remember what it is   Frequent unifocal PVCs 01/2018   Event monitor showed normal sinus rhythm with frequent multifocal PVCs (9%) and PACs.  Nighttime bradycardia suggestive of OSA.   Hiatal hernia    with reflux   Hyperlipidemia    Statin intolerant   Hypertension    Ichthyosis congenita    Intermittent claudication of both lower extremities due to atherosclerosis ([place]) 08/22/2021   LEA Dopplers 09/09/2021:  Right: Total occlusion noted in the superficial femoral artery. Atherosclerosis noted throughout extremity, see note above. Three vessel runoff.  Left: Total occlusion noted in the superficial femoral artery and/or popliteal artery. Total occlusion noted in the distal anterior tibial artery. Athereosclerosis noted throughout extremity.    NSVD (normal spontaneous vaginal delivery) 1971 & 1972   Obesity    Plantar fasciitis    Left   Pulmonary nodule    Imaged multiple times and benign appearing   Wears glasses     Past Surgical History:  Procedure Laterality Date   ABDOMINAL HYSTERECTOMY  1975   TAH-BSO   ABSCESS DRAINAGE  1972   right breast   BREAST BIOPSY Left 01/14/2009   Stereo- Benign   CARDIAC MRI  01/2018   Difficult to interpret 2/2 PVCs. Normal LV size - EF ~27% with diffuse HK. Mild RV dilation - normal fxn. --  NO MYOCARDIAL LGI => no definitive evidence of prior MI, Infiltrative Dz or Myocarditis -- suspect NICM, possibly related to PVCs.   CARPAL TUNNEL RELEASE Right 07/02/2014   Procedure: RIGHT CARPAL TUNNEL RELEASE;  Surgeon: Wynonia Sours, MD;  Location: [place];  Service: Orthopedics;  Laterality: Right;   CARPAL TUNNEL RELEASE Left 06/11/2015   Procedure: LEFT CARPAL TUNNEL RELEASE;  Surgeon: Daryll Brod, MD;  Location: [place];  Service: Orthopedics;  Laterality: Left;  REGIONAL/FAB   COLONOSCOPY     corn removal  1968   right   Holter Monitor   06/2017   ~17,000 PVC beats - majority were singlets, some couplets. 44 brief 3-7 beat runs of NSVT. Also noted were less frequent PACs with 11 runs (longest 15 beats)   OPEN REDUCTION INTERNAL FIXATION (ORIF) DISTAL RADIAL FRACTURE Left 07/21/2017   Procedure: OPEN REDUCTION INTERNAL FIXATION (ORIF) LEFT DISTAL RADIAL FRACTURE;  Surgeon: Renette Butters, MD;  Location: [place];  Service: Orthopedics;  Laterality: Left;   RIGHT/LEFT HEART CATH AND CORONARY ANGIOGRAPHY N/A 06/30/2017   Procedure: Right/Left Heart Cath and Coronary Angiography;  Surgeon: Leonie Man, MD;  [facility] INVASIVE CV LAB:  pRCA 55%, pCx 40%, OM1 45%, mCx 50%, D2 50% - LVEF 25-35%. Moderately elevated LVEDP(26 mmHg with PCWP 16 mmHg).  FICK CO/CI: 4.47/2.48. PA pressures 47/14 mmHg with a mean of 27 mm.   ROTATOR CUFF REPAIR     left shoulder   TRANSTHORACIC ECHOCARDIOGRAM  06/2017   EF 25 and 30%. GR 1 DD. Mild diastolic dysfunction with elevated LVEDP. Mild valvular disease.   TRANSTHORACIC ECHOCARDIOGRAM  11'18, 6/'19    a) EF remains 30-35%.  Diffuse hypokinesis noted.  Severe LA dilation; b) Improved EF 35-40%.  Diffuse HK.  GR 1 DD.  Moderate TR.  Mild RV dilation.   TRANSTHORACIC ECHOCARDIOGRAM  1/'20; 1/'21   a) EF 50 to 55%. Gr I DD. (Notabley improved LV Fxn.  Moderate LA dilation.  Mod MAC.  AoV sclerosis w/o stenosis;; b) EF 60 to 65%.  Normal LV size and function.  GRII DD.  Mild to moderate MR.  Normal PA pressures.   ZIO Patch 14 d Event Monitor  10/2018   Results as below < 1% PVC.  (Notably improved after starting amiodarone)     Current Outpatient Medications  Medication Sig Dispense Refill   acetaminophen (TYLENOL) 500 MG tablet Take 500 mg by mouth every 6 (six) hours as needed.     amiodarone (PACERONE) 200 MG tablet TAKE 1/2 TABLET BY MOUTH EVERY DAY 90 tablet 3   aspirin 81 MG tablet Take 1 tablet (81 mg total) by mouth daily.     carvedilol (COREG) 25 MG tablet TAKE 1 TABLET BY MOUTH TWICE  DAILY 180 tablet 1   Cholecalciferol (VITAMIN D) 1000 UNITS capsule Take 1,000 Units by mouth daily.     furosemide (LASIX) 20 MG tablet 1 tab every Tuesday Thursday and Saturday     glucose blood (ACCU-CHEK AVIVA PLUS) test strip USE TO TEST BLOOD SUGAR ONCE DAILY FOR DM E11.9 100 each 1   JARDIANCE 10 MG TABS tablet TAKE 1/2 TABLET(5 MG) BY MOUTH DAILY 15 tablet 6   Lancets (ACCU-CHEK MULTICLIX) lancets USE ONE LANCET TO TEST BLOOD GLUCOSE DAILY FOR DM 250.00 102 each 3   metFORMIN (GLUCOPHAGE) 500 MG tablet TAKE 1/2 TABLET BY MOUTH DAILY WITH A MEAL     nystatin cream (MYCOSTATIN) Apply 1 application  topically 2 (two) times daily. 30 g 0   predniSONE (DELTASONE) 10 MG tablet Take 1 a day for 5 days after breakfast. Don't take with aleve/ibuprofen. 5 tablet 0   rosuvastatin (CRESTOR) 10 MG tablet Take 1 tablet (10 mg total) by mouth daily. 90 tablet 3   sacubitril-valsartan (ENTRESTO) 97-103 MG Take 1 tablet by mouth 2 (two) times daily. 180 tablet 3   traMADol (ULTRAM) 50 MG tablet Take 1 tablet (50 mg total) by mouth 3 (three) times daily as needed. 90 tablet 5   No current facility-administered medications for this visit.    Allergies:   Ezetimibe-simvastatin, Lipitor [atorvastatin], Claritin [loratadine], Tamiflu [oseltamivir phosphate], Pravastatin sodium, and Sulfonamide derivatives    Social History:  The patient  reports that she quit smoking about 28 years ago. Her smoking use included cigarettes. She has never used smokeless tobacco. She reports that she does not drink alcohol and does not use drugs.   Family History:  The patient's family history includes Cancer in her brother; Diabetes in her father and mother; Heart disease in her brother, father, and mother; Hypertension in an other family member.    ROS:  Please see the history of present illness.   Otherwise, review of systems are positive for none.   All other systems are reviewed and negative.    PHYSICAL EXAM: VS:  BP  124/76 (BP Location: Left Arm, Patient Position: Sitting, Cuff Size: Normal)    Pulse 71    Resp 20    Ht _0  (1.575 m)    Wt 177 lb 9.6 oz (80.6 kg)    SpO2 96%    BMI 32.48 kg/m  , BMI Body mass index is 32.48 kg/m. GEN: Well nourished, well developed, in no acute distress  HEENT: normal  Neck: no JVD, carotid bruits, or masses Cardiac: RRR; no murmurs, rubs, or gallops,no edema  Respiratory:  clear to auscultation bilaterally, normal work of breathing GI: soft, nontender, nondistended, + BS MS: no deformity or atrophy  Skin: warm and dry, no rash Neuro:  Strength and sensation are intact Psych: euthymic mood, full affect Vascular: Femoral pulse: +2 bilaterally.  Distal pulses are not palpable.   EKG:  EKG is not ordered today.    Recent Labs: 03/19/2021: TSH 1.23 01/14/2022: ALT 18; Brain Natriuretic Peptide 1,333; BUN 17; Creat 1.28; Hemoglobin 13.5; Platelets 287; Potassium 4.9; Sodium 144    Lipid Panel    Component Value Date/Time   CHOL 141 03/19/2021 1121   CHOL 139 10/09/2020 0933   TRIG 89.0 03/19/2021 1121   HDL 42.10 03/19/2021 1121   HDL 42 10/09/2020 0933   CHOLHDL 3 03/19/2021 1121   VLDL 17.8 03/19/2021 1121   LDLCALC 81 03/19/2021 1121   LDLCALC 80 10/09/2020 0933   LDLDIRECT 53 08/10/2009 0000      Wt Readings from Last 3 Encounters:  01/18/22 177 lb 9.6 oz (80.6 kg)  01/14/22 177 lb (80.3 kg)  12/21/21 177 lb (80.3 kg)       No flowsheet data found.    ASSESSMENT AND PLAN:  1.  Peripheral arterial disease: Moderate to severe bilateral calf claudication due to SFA occlusion bilaterally.  Her symptoms are not lifestyle limiting at the present time and she seems to have limitations related to shortness of breath.  Recommend continuing aggressive medical therapy.  I again discussed with her the importance of regular exercise and she seems to be very motivated to do so.  2.  PVCs: Seems  to be well suppressed with low-dose amiodarone and  carvedilol.  3.  History of chronic systolic heart failure: Seems to be well compensated and currently on optimal medical therapy.  4.  Hyperlipidemia: Currently on lovastatin with intolerance to other statins.  Most recent lipid profile showed an LDL of 80.    Disposition:   FU with me in 6 months  Signed,  Kathlyn Sacramento, MD  01/18/2022 9:57 AM    [place]

## 2022-01-20 ENCOUNTER — Encounter: Payer: Self-pay | Admitting: Cardiovascular Disease

## 2022-01-20 ENCOUNTER — Encounter: Payer: Self-pay | Admitting: Cardiology

## 2022-01-20 ENCOUNTER — Encounter: Payer: Self-pay | Admitting: Family Medicine

## 2022-01-20 DIAGNOSIS — J811 Chronic pulmonary edema: Secondary | ICD-10-CM | POA: Diagnosis not present

## 2022-01-20 DIAGNOSIS — I517 Cardiomegaly: Secondary | ICD-10-CM | POA: Diagnosis not present

## 2022-01-20 DIAGNOSIS — R0600 Dyspnea, unspecified: Secondary | ICD-10-CM | POA: Diagnosis not present

## 2022-01-20 NOTE — Telephone Encounter (Signed)
Please call patient about her MyChart message and the answers to her questions below.  1- what is my BNP level? in relation to the normal level BNP is a fluid test, a marker of fluid retention.  It is commonly elevated in patients who have heart failure.  Her BNP is likely always going to be elevated some and I do not suspect it will be easy or reasonable to try to get her BNP all the way down to a level that would be normal for patient that did not have heart failure.  If we put her on enough diuretics to get her BNP drastically lower, then that would probably cause other problems.  2- what am I doing that creates this problem is it diet or something else I eexercise 6 days a week for 30 minutes? She is not doing anything to cause this problem.  It is a marker of heart failure that we will treat with her current medications.  I would continue with diet and exercise as she has been.   3-what can I do to get this normal? We should not try to get this normal but we should try to improve her situation.  We may need to adjust her Lasix dose over the long-term.  I want to know how she feels with the higher dose of Lasix that she has been taking for the last few days.   4-how can I know what to be looking out for that this may be worsening? The main things for her monitor are her breathing and her weight.  If her breathing is getting worse or if her weight is going up I want her to let us know.   I need to know about her breathing with the recent higher dose of Lasix.  Thanks.

## 2022-01-21 NOTE — Telephone Encounter (Signed)
LMTCB

## 2022-01-23 NOTE — Telephone Encounter (Signed)
Chronic patient concerned about BNP level.  BNP level is indeed elevated which is a concern for someone who has had history of heart failure.  The treatment plan includes increasing doses of furosemide.  It is quite possible that with the respiratory illness that she had, the heart was also affected with some heart failure.  She has been doing very well of late not requiring much in the way of diuretic.  Thankfully she was just seen by Dr.  Kirke Corin who is indeed also a cardiologist & his note indicates that she seemed to be stable from a CHF standpoint.    She has had high BNP levels in the past while we were getting her CHF under control.  I would like to recheck an Echocardiogram to make sure that her heart is still doing well.    We should have her come in to be seen by me or APP to see how she is feeling & review Echo results.  Bryan Lemma, MD

## 2022-01-24 ENCOUNTER — Other Ambulatory Visit: Payer: Self-pay

## 2022-01-24 ENCOUNTER — Telehealth: Payer: Self-pay | Admitting: Cardiology

## 2022-01-24 DIAGNOSIS — I493 Ventricular premature depolarization: Secondary | ICD-10-CM

## 2022-01-24 DIAGNOSIS — I5032 Chronic diastolic (congestive) heart failure: Secondary | ICD-10-CM

## 2022-01-24 NOTE — Telephone Encounter (Signed)
Left message for patient to return the call.

## 2022-01-24 NOTE — Telephone Encounter (Signed)
Called pt to inquire about her message. She states Dr. Carson Myrtle messaged her saying she needed an echo. After looking in her chart I see the message from Dr. Herbie Baltimore. The echo order was placed. Pt made aware she will be contacted to schedule the echo appt. And the follow up should be scheduled after that appt is made. She verbalized understanding.

## 2022-01-24 NOTE — Telephone Encounter (Signed)
Spoke with patient about her results and the questions she had. Patient states the lasix really helped the rattling in her throat but still has the SOB. States the SOB is a little better but not much. She will finish the higher dose this week. States her weight has come down some too. Patient would like to know what to do next.

## 2022-01-24 NOTE — Progress Notes (Signed)
Order for echo placed per MyChart message Dr. Herbie Baltimore sent to pt.

## 2022-01-24 NOTE — Telephone Encounter (Signed)
Patient was returning Triage Nurse's call

## 2022-01-24 NOTE — Telephone Encounter (Signed)
°  Patient sent message to MyChart scheduling pool requesting to schedule an Echo that she states Dr Herbie Baltimore wanted her to have. I do not see any orders for an Echo at this time. Please advise.

## 2022-01-25 ENCOUNTER — Ambulatory Visit
Admission: RE | Admit: 2022-01-25 | Discharge: 2022-01-25 | Disposition: A | Discharge status: Home / Self Care | Payer: Medicare Other | Source: Ambulatory Visit | Attending: Orthopedic Surgery | Admitting: Orthopedic Surgery

## 2022-01-25 ENCOUNTER — Other Ambulatory Visit: Payer: Self-pay

## 2022-01-25 DIAGNOSIS — M75101 Unspecified rotator cuff tear or rupture of right shoulder, not specified as traumatic: Secondary | ICD-10-CM

## 2022-01-25 DIAGNOSIS — M25511 Pain in right shoulder: Secondary | ICD-10-CM | POA: Diagnosis not present

## 2022-01-25 MED ORDER — FUROSEMIDE 20 MG PO TABS: 20.0000 mg | ORAL_TABLET | Freq: Every day | ORAL | Status: DC

## 2022-01-25 NOTE — Telephone Encounter (Addendum)
I would finish the higher dose of lasix and see how she feels over the next few days.  If needed she can continue 20mg  a day of lasix.  If weight increases with 20mg  a day, then she can take 40mg  a day.  She would need to recheck her weight each AM prior to breakfast.   If weight is up, take 40mg .   If weight is not up, take 20mg .   Please update me in about 1 week.    Thanks.

## 2022-01-25 NOTE — Addendum Note (Signed)
Addended by: [name] on: 01/25/2022 01:24 PM   Modules accepted: Orders

## 2022-01-27 ENCOUNTER — Telehealth: Payer: Self-pay

## 2022-01-27 NOTE — Progress Notes (Signed)
° ° °  Chronic Care Management Pharmacy Assistant   Name: CIELA DAIGREPONT  MRN: [id number] DOB: 1941/06/06  Reason for Encounter: CCM (Appointment Reminder)  Medications: Outpatient Encounter Medications as of 01/27/2022  Medication Sig   acetaminophen (TYLENOL) 500 MG tablet Take 500 mg by mouth every 6 (six) hours as needed.   amiodarone (PACERONE) 200 MG tablet TAKE 1/2 TABLET BY MOUTH EVERY DAY   aspirin 81 MG tablet Take 1 tablet (81 mg total) by mouth daily.   carvedilol (COREG) 25 MG tablet TAKE 1 TABLET BY MOUTH TWICE DAILY   Cholecalciferol (VITAMIN D) 1000 UNITS capsule Take 1,000 Units by mouth daily.   furosemide (LASIX) 20 MG tablet Take 1 tablet (20 mg total) by mouth daily. With extra 20mg  if weight is increasing.   glucose blood (ACCU-CHEK AVIVA PLUS) test strip USE TO TEST BLOOD SUGAR ONCE DAILY FOR DM E11.9   JARDIANCE 10 MG TABS tablet TAKE 1/2 TABLET(5 MG) BY MOUTH DAILY   Lancets (ACCU-CHEK MULTICLIX) lancets USE ONE LANCET TO TEST BLOOD GLUCOSE DAILY FOR DM 250.00   metFORMIN (GLUCOPHAGE) 500 MG tablet TAKE 1/2 TABLET BY MOUTH DAILY WITH A MEAL   nystatin cream (MYCOSTATIN) Apply 1 application topically 2 (two) times daily.   predniSONE (DELTASONE) 10 MG tablet Take 1 a day for 5 days after breakfast. Don't take with aleve/ibuprofen.   rosuvastatin (CRESTOR) 10 MG tablet Take 1 tablet (10 mg total) by mouth daily.   sacubitril-valsartan (ENTRESTO) 97-103 MG Take 1 tablet by mouth 2 (two) times daily.   traMADol (ULTRAM) 50 MG tablet Take 1 tablet (50 mg total) by mouth 3 (three) times daily as needed.   No facility-administered encounter medications on file as of 01/27/2022.   TAHIRA RUSCH was contacted to remind of upcoming telephone visit with Charlene Brooke on 02/02/2022 at 11:00 am. Patient was reminded to have all medications, supplements and any blood glucose and blood pressure readings available for review at appointment. If unable to reach, a voicemail  was left for patient.   Star Rating Drugs: Medication:    Last Fill: Day Supply Jardiance 10 mg   01/12/2022 30 Metformin 500 mg   08/09/2021 90     Sacubitril-Valsartan 97-103 mg 01/06/2021 90  Rosuvastatin 10 mg   12/20/2021 90 Fill dates verified with [facility]   Charlene Brooke, CPP notified  Marijean Niemann, [place] Clinical Pharmacy Assistant [phone]  Time Spent: 10 Minutes

## 2022-01-28 ENCOUNTER — Ambulatory Visit (HOSPITAL_COMMUNITY): Payer: Medicare Other | Attending: Cardiology

## 2022-01-28 ENCOUNTER — Encounter: Payer: Self-pay | Admitting: Cardiovascular Disease

## 2022-01-28 ENCOUNTER — Other Ambulatory Visit: Payer: Self-pay

## 2022-01-28 DIAGNOSIS — I5032 Chronic diastolic (congestive) heart failure: Secondary | ICD-10-CM

## 2022-01-28 DIAGNOSIS — I493 Ventricular premature depolarization: Secondary | ICD-10-CM

## 2022-01-28 HISTORY — PX: TRANSTHORACIC ECHOCARDIOGRAM: SHX275

## 2022-01-28 LAB — ECHOCARDIOGRAM COMPLETE
Area-P 1/2: 3.99 cm2
Calc EF: 50.9 %
MV M vel: 5.78 m/s
MV Peak grad: 133.6 mmHg
Radius: 0.4 cm
S' Lateral: 4.3 cm
Single Plane A2C EF: 50.9 %
Single Plane A4C EF: 50.1 %

## 2022-01-28 NOTE — Progress Notes (Signed)
08/23/2018-81 year old female former smoker for sleep evaluation. Sleep Consult: Self Referral: Pt notes snoring, has family hx of CPAP.  Medical problem list includes dilated cardiomyopathy, CHF/systolic and diastolic, DM 2, HBP, icthyosis She wants to find out if she has sleep apnea.  Several siblings use CPAP.  Family reports loud snoring.  For the last 6 weeks she has been putting a piece of paper tape across her mouth at bedtime and says she sleeps better like this with less waking. Feels rested in the daytime.  Denies taking sleep medicines.  Little caffeine.  No ENT surgery. Wants to wait on flu shot.  Denies lung disease and says her cardiac status is under control, working with cardiology. Epworth score 6  12/10/2018-81 year old female former smoker followed for OSA, complicated by dilated cardiomyopathy, CHF/systolic and diastolic, DM 2, HBP, ichthyosis HST-10/24/2018 Complex (central and obstructive) sleep apnea, AHI 5.9/hour, desaturation to 81%, body weight 178 pounds We discussed her minimal sleep apnea.  She was mostly concerned because of a strong family history of sleep apnea requiring CPAP.  She was very pleased when I told her that conservative measures would probably be sufficient.  She understands the value of weight loss she avoids sleeping on her back.  She might be able to benefit from an OTC oral appliance for snoring.  01/31/22- 81 year old female former smoker previously followed for mild OSA, complicated by dilated cardiomyopathy, CHF/systolic and diastolic, PAD, DM 2, HBP, ichthyosis Coming now to reestablish. -----Patient was seen here in 2019 for possible OSA and was told that she did not have it at that time. Is here to be retested.  Epworth score-10 Body weight today- Covid vax-4 Phizer Flu vax-had She is told by her husband that she snores.  She has an extensive family history of OSA and wonders why it skipped her.  We again discussed risk factors, medical concerns  and treatment options.  She has been using tape to hold her mouth closed at night and thinks this helps. ECHO 01/28/22- 50-55%, Gr2 DD CXR 01/14/22- IMPRESSION: 1. Cardiomegaly with mild pulmonary vascular congestion.   ROS-see HPI   + = positive Constitutional:    weight loss, night sweats, fevers, chills, fatigue, lassitude. HEENT:    headaches, difficulty swallowing, tooth/dental problems, sore throat,       sneezing, itching, ear ache, nasal congestion, post nasal drip, snoring CV:    chest pain, orthopnea, PND, swelling in lower extremities, anasarca,                     dizziness, +palpitations Resp:   +shortness of breath with exertion or at rest.                productive cough,   non-productive cough, coughing up of blood.              change in color of mucus.  wheezing.   Skin:    +rash or lesions. GI:  No-   heartburn, indigestion, abdominal pain, nausea, vomiting, diarrhea,                 change in bowel habits, loss of appetite GU: dysuria, change in color of urine, no urgency or frequency.   flank pain. MS:   joint pain, stiffness, decreased range of motion, back pain. Neuro-     nothing unusual Psych:  change in mood or affect.  depression or anxiety.   memory loss.  OBJ- Physical Exam General- Alert, Oriented, Affect-appropriate, Distress- none acute, +  overweight Skin- + ichthyosis with hyperpigmentation Lymphadenopathy- none Head- atraumatic            Eyes- Gross vision intact, PERRLA, conjunctivae and secretions clear            Ears-+ hard of hearing            Nose-+ turbinate edema, no-Septal dev, mucus, polyps, erosion, perforation             Throat- Mallampati IV , mucosa clear , drainage- none, tonsils- atrophic, + own teeth Neck- flexible , trachea midline, no stridor , thyroid nl, carotid no bruit Chest - symmetrical excursion , unlabored           Heart/CV- RRR , no murmur , no gallop  , no rub, nl s1 s2                           - JVD- none , edema-  none, stasis changes- none, varices- none           Lung- clear to P&A, wheeze- none, cough- none , dullness-none, rub- none           Chest wall-  Abd-  Br/ Gen/ Rectal- Not done, not indicated Extrem- cyanosis- none, clubbing, none, atrophy- none, strength- nl Neuro- grossly intact to observation

## 2022-01-31 ENCOUNTER — Other Ambulatory Visit: Payer: Self-pay

## 2022-01-31 ENCOUNTER — Institutional Professional Consult (permissible substitution): Payer: Medicare Other | Admitting: Internal Medicine

## 2022-01-31 ENCOUNTER — Encounter: Payer: Self-pay | Admitting: Internal Medicine

## 2022-01-31 ENCOUNTER — Ambulatory Visit (INDEPENDENT_AMBULATORY_CARE_PROVIDER_SITE_OTHER): Payer: Medicare Other | Admitting: Internal Medicine

## 2022-01-31 VITALS — BP 140/80 | HR 66 | Temp 98.3°F | Ht 62.0 in | Wt 174.2 lb

## 2022-01-31 DIAGNOSIS — G4731 Primary central sleep apnea: Secondary | ICD-10-CM

## 2022-01-31 DIAGNOSIS — R0683 Snoring: Secondary | ICD-10-CM | POA: Diagnosis not present

## 2022-01-31 DIAGNOSIS — I5032 Chronic diastolic (congestive) heart failure: Secondary | ICD-10-CM | POA: Diagnosis not present

## 2022-01-31 NOTE — Patient Instructions (Signed)
Order- schedule home sleep test     dx OSA  Please call us about 2 weeks after your sleep test for results and recommendations 

## 2022-01-31 NOTE — Assessment & Plan Note (Signed)
Appropriate to reassess.  Central component may be aggravated by her cardiac disease. Plan-schedule sleep study

## 2022-01-31 NOTE — Assessment & Plan Note (Signed)
Exam today is benign but last chest x-ray suggested borderline fluid overload.  She continues following with cardiology.

## 2022-02-01 ENCOUNTER — Other Ambulatory Visit: Payer: Self-pay | Admitting: Family Medicine

## 2022-02-02 ENCOUNTER — Other Ambulatory Visit: Payer: Self-pay

## 2022-02-02 ENCOUNTER — Ambulatory Visit (INDEPENDENT_AMBULATORY_CARE_PROVIDER_SITE_OTHER): Payer: Medicare Other | Admitting: Pharmacist

## 2022-02-02 ENCOUNTER — Telehealth: Payer: Self-pay

## 2022-02-02 DIAGNOSIS — I10 Essential (primary) hypertension: Secondary | ICD-10-CM

## 2022-02-02 DIAGNOSIS — I5032 Chronic diastolic (congestive) heart failure: Secondary | ICD-10-CM

## 2022-02-02 DIAGNOSIS — E1151 Type 2 diabetes mellitus with diabetic peripheral angiopathy without gangrene: Secondary | ICD-10-CM

## 2022-02-02 DIAGNOSIS — E782 Mixed hyperlipidemia: Secondary | ICD-10-CM

## 2022-02-02 DIAGNOSIS — I70213 Atherosclerosis of native arteries of extremities with intermittent claudication, bilateral legs: Secondary | ICD-10-CM

## 2022-02-02 MED ORDER — NYSTATIN 100000 UNIT/GM EX CREA: 1.0000 "application " | TOPICAL_CREAM | Freq: Two times a day (BID) | CUTANEOUS | 0 refills | Status: DC

## 2022-02-02 NOTE — Patient Instructions (Signed)
Visit Information  Phone number for Pharmacist: [phone]   Goals Addressed   None     Care Plan : CCM Pharmacy Care Plan  Updates made by [name], [facility] since 02/02/2022 12:00 AM     Problem: Hypertension, Hyperlipidemia, Diabetes, Heart Failure, and Chronic Kidney Disease, PAD   Priority: High     Long-Range Goal: Disease management   Start Date: 09/29/2021  Expected End Date: 09/29/2022  This Visit's Progress: On track  Recent Progress: On track  Priority: High  Note:   Current Barriers:  Unable to independently monitor therapeutic efficacy Unable to maintain control of HTN  Pharmacist Clinical Goal(s):  Patient will achieve adherence to monitoring guidelines and medication adherence to achieve therapeutic efficacy adhere to plan to optimize therapeutic regimen for HTN as evidenced by report of adherence to recommended medication management changes through collaboration with PharmD and provider.   Interventions: 1:1 collaboration with [name], MD regarding development and update of comprehensive plan of care as evidenced by provider attestation and co-signature Inter-disciplinary care team collaboration (see longitudinal plan of care) Comprehensive medication review performed; medication list updated in electronic medical record  Hyperlipidemia / PAD (LDL goal < 70) -Not ideally controlled - pt endorses compliance with new rosuvastatin (changed last visit from lovastatin due to leg cramps/LDL not at goal); repeat lipids have not been checked since switch -New occlusive PAD Sept 2022; considering Xarelto 2.5 mg -Current treatment: Rosuvastatin 10 mg daily - Appropriate, Query Effective, Safe, Accessible Aspirin 81 mg daily - Appropriate, Effective, Safe, Accessible -Previously tried/failed meds: lovastatin -[facility] -Educated on Cholesterol goals; Benefits of statin for ASCVD risk reduction; Strategies  to manage statin-induced myalgias; benefits of aspirin in PAD -Counseled to continued daily exercise routine -Recommend to continue current medication; repeat lipid panel in 8-12 weeks  Diabetes (A1c goal <7%) -Controlled - A1c is at goal; pt normally checked BG once a week but has checked daily over past week in preparation for this appt; she endorses compliance with medications as below; she is working on pt assistance for [employer] with cardiology  -Current home glucose readings fasting glucose: 94-102 -Current medications: Metformin 500 mg - 1/2 tab daily - Appropriate, Effective, Safe, Accessible Jardiance 10 mg - 1/2 tab daily - Appropriate, Effective, Safe, Query Accessible Testing supplies -Educated on A1c and blood sugar goals; Complications of diabetes including kidney damage, retinal damage, and cardiovascular disease; benefits of metformin and Jardiance for DM and CV protection -Recommend to continue current medication  Heart Failure / Hypertension (Goal: BP < 140/90) -Controlled- pt completed course of higher-dose furosemide and weight has improved to baseline, swelling/SOB is better, she resumed 3x weekly dosing of furosemide and reports doing well; she also reports BP is much better; she has not heard anything back about Jardiance PAP after cardiologist sent Rx in January -Sleep study pending, may have OSA -Wt 171 lbs (dry weight) -Current home BP/HR readings: SBP 120s -Hx frequent PVCs controlled with amiodarone -Last ejection fraction: 50-55% (Date: 01/28/22) -HF type: Diastolic; NYHA Class: I (no actitivty limitation) -Current treatment: Carvedilol 25 mg BID - Appropriate, Effective, Safe, Accessible Furosemide 20 mg MWF -Appropriate, Effective, Safe, Accessible Entresto 97-103 mg BID -Appropriate, Effective, Safe, Accessible Amiodarone 200 mg - 1/2 tab daily -Appropriate, Effective, Safe, Accessible Jardiance 10 mg -1/2 tab daily -Appropriate, Effective, Safe, Query  Accessible -Educated on Benefits of medications for managing symptoms and prolonging life; Importance of blood pressure control; proper  BP monitoring technique -Counseled on beta blocker side effects including exercise intolerance; it is likely that carvedilol is contributing to her early fatigue while walking; advised she can exercise early in AM and take carvedilol afterward -Recommend to continue current medication; check on Jardiance PAP, can enroll in [employer] (cardiomyopathy) if denied  Health Maintenance -Vaccine gaps: TDAP, Covid booster -Counseled to get bivalent covid booster at [facility]; discussed benefits/risks of TDAP booster, pt is low risk for TD and would like to forego 10-year booster  Patient Goals/Self-Care Activities Patient will:  - take medications as prescribed -focus on medication adherence by pill box -check blood pressure 3x weekly -collaborate with provider on medication access solutions (Jardiance)      Patient verbalizes understanding of instructions and care plan provided today and agrees to view in MyChart. Active MyChart status confirmed with patient.   Telephone follow up appointment with pharmacy team member scheduled for: 3 months  Al Corpus, PharmD, [facility] Clinical Pharmacist [facility] [phone]

## 2022-02-02 NOTE — Progress Notes (Signed)
° ° °  Chronic Care Management Pharmacy Assistant   Name: MIELA DESJARDIN  MRN: [id number] DOB: 10-04-1941  Reason for Encounter: CCM (Jardiance PAP - [employer])   Called [employer] to inquire on the status of Jardiance application. Application came from Dr. Herbie Baltimore (Cardiology) for Jardiance 10 mg - 1/2 tab daily. [employer] states patient qualifies for Low Income Subsidy. Dr. Elissa Hefty office and the patient would need to submit paperwork for Low Income Subsidy first. If she is denied through them Dr. Herbie Baltimore would need to send a denial letter with a new prescription to [facility]. The prescription for [employer] for Jardiance must be for 30 or 90 days/supply, not 45 as the previous one is written.  Al Corpus, CPP notified  Claudina Lick, [place] Clinical Pharmacy Assistant [phone]  Time Spent: 15 Minutes

## 2022-02-02 NOTE — Progress Notes (Signed)
Chronic Care Management Pharmacy Note  02/02/2022 Name:  Natasha Woodard MRN:  [id number] DOB:  [birth year]  Summary: CCM F/U visit -BP at home much improved, pt reports SBP 120s -Swelling/SOB improved after course of higher-dose furosemide, she has now resumed 3x weekly furosemide 20 mg and feels well -She has not heard anything back about Jardiance PAP (through cardiology office). It appears Rx fax was requested 12/16/21 -Pt is awaiting home sleep study for OSA evaluation  Recommendations/Changes made from today's visit: -No med changes -Will check on Jardiance PAP; consider [facility] (cardiomyopathy) if denied  Plan: -[place] will call [facility] about Jardiance -Pharmacist follow up televisit scheduled for 3 months   Subjective: Natasha Woodard is an 81 y.o. year old female who is a primary patient of Natasha Woodard, Natasha Rising, MD.  The CCM team was consulted for assistance with disease management and care coordination needs.    Engaged with patient by telephone for follow up visit in response to provider referral for pharmacy case management and/or care coordination services.   Consent to Services:  The patient was given information about Chronic Care Management services, agreed to services, and gave verbal consent prior to initiation of services.  Please see initial visit note for detailed documentation.   Patient Care Team: Tonia Ghent, MD as PCP - General (Family Medicine) Leonie Man, MD as PCP - Cardiology (Cardiology) Bensimhon, Shaune Pascal, MD as PCP - Advanced Heart Failure (Cardiology) Wellington Hampshire, MD as PCP - [facility] (Cardiology) Hortencia Pilar, MD as Consulting Physician (Ophthalmology) Charlton Haws, [facility] as Pharmacist (Pharmacist)   Recent office visits: 01/14/22 Dr Natasha Woodard OV: c/o wheezing, covid exposure (testing negative but husband testing positive); Rx prednisone 10 mg x 5 days. BNP elevated, increase furosemide 40 mg x 3  days, then alternate 40 mg/20 mg for 1 week  08/19/2021 - Elsie Stain, MD - Patient presented for follow up Diabetes. Labs: A1c. Change: metFORMIN (GLUCOPHAGE) 500 MG tablet. Take half tablet by mouth daily with meal (previously twice a day).   04/29/2021 -  Elsie Stain, MD - Patient presented for cough. Start: benzonatate (TESSALON) 200 MG capsule.  04/06/2021 - Elsie Stain, MD - [place] presented for Annual Wellness Visit. Stop: omeprazole (PRILOSEC) 20 MG capsule as no longer needed. Labs: CXR and EKG. Start: OTC Zyrtec.   Recent consult visits: 01/31/22 Dr Annamaria Boots (pulmonary) f/u OSA, HF. Schedule home sleep study.  01/18/22 Dr Fletcher Anon (cardiology): f/u PAD, HF. No med changes.  09/14/2021 - Kathlyn Sacramento, MD - Cardiology - eval PAD. No medication changes. Emphasized exercise.  09/09/2021 - Glenetta Hew, MD - Cardiology - Patient presented for follow up on Hyperlipidemia. Labs. EKG. Referral for Peripheral Vascular Consult. suspect now that we have identified what appears to be occlusive PAD, would probably want to convert from lovastatin to likely rosuvastatin.  We can address this in follow-up.  09/09/2021 - [facility] - Patient presented for [facility]. No other information.  04/27/2021 - [facility] - Patient presented for with cough, chest congestion.  03/31/2021 - Glori Bickers - Cardiology - Patient presented for congestive heart failure. No medication changes.   Hospital visits: None in previous 6 months   Objective:  Lab Results  Component Value Date   CREATININE 1.28 (H) 01/14/2022   BUN 17 01/14/2022   GFR 45.93 (L) 03/19/2021   GFRNONAA 40 (L) 10/10/2020   GFRAA 49 (L) 10/09/2020   NA 144 01/14/2022  K 4.9 01/14/2022   CALCIUM 9.6 01/14/2022   CO2 30 01/14/2022   GLUCOSE 89 01/14/2022   Lab Results  Component Value Date/Time   HGBA1C 6.4 (H) 01/14/2022 03:51 PM   HGBA1C 6.2 (A) 08/19/2021 11:19 AM   HGBA1C 6.6 (H) 03/19/2021  11:21 AM   GFR 45.93 (L) 03/19/2021 11:21 AM   GFR 52.41 (L) 11/12/2019 09:21 AM    Last diabetic Eye exam:  Lab Results  Component Value Date/Time   HMDIABEYEEXA No Retinopathy 10/06/2020 12:00 AM   HMDIABEYEEXA No Retinopathy 10/06/2020 12:00 AM    Last diabetic Foot exam:  Lab Results  Component Value Date/Time   HMDIABFOOTEX yes 01/25/2011 12:00 AM     Lab Results  Component Value Date   CHOL 141 03/19/2021   HDL 42.10 03/19/2021   LDLCALC 81 03/19/2021   LDLDIRECT 53 08/10/2009   TRIG 89.0 03/19/2021   CHOLHDL 3 03/19/2021    Hepatic Function Latest Ref Rng & Units 01/14/2022 03/19/2021 10/10/2020  Total Protein 6.1 - 8.1 g/dL 6.6 6.5 6.1(L)  Albumin 3.5 - 5.2 g/dL - 3.9 3.5  AST 10 - 35 U/L _0 ALT 6 - 29 U/L _1 Alk Phosphatase 39 - 117 U/L - 56 58  Total Bilirubin 0.2 - 1.2 mg/dL 0.7 0.5 0.7  Bilirubin, Direct 0.00 - 0.40 mg/dL - - -    Lab Results  Component Value Date/Time   TSH 1.23 03/19/2021 11:21 AM   TSH 2.148 04/17/2020 03:08 PM   TSH 1.87 11/12/2019 09:21 AM   FREET4 1.32 (H) 04/17/2020 03:08 PM   FREET4 1.28 10/08/2018 11:14 AM    CBC Latest Ref Rng & Units 01/14/2022 03/19/2021 10/09/2020  WBC 3.8 - 10.8 Thousand/uL 4.3 4.3 3.9  Hemoglobin 11.7 - 15.5 g/dL 13.5 13.6 14.2  Hematocrit 35.0 - 45.0 % 39.9 41.8 44.9  Platelets 140 - 400 Thousand/uL 287 333.0 379    Lab Results  Component Value Date/Time   VD25OH 60 07/23/2013 10:13 AM    Clinical ASCVD: Yes  - PAD The ASCVD Risk score (Arnett DK, et al., 2019) failed to calculate for the following reasons:   The 2019 ASCVD risk score is only valid for ages 23 to 10    Lab Results  Component Value Date/Time   BNP 1,333 (H) 01/14/2022 03:51 PM   BNP 263.3 (H) 10/10/2019 12:55 PM   BNP 338.7 (H) 10/08/2018 11:15 AM   Depression screen [facility] 2/9 04/08/2021 11/12/2019 11/15/2018  Decreased Interest 0 0 0  Down, Depressed, Hopeless 0 0 0  PHQ - 2 Score 0 0 0  Altered sleeping 0 0 -  Tired,  decreased energy 0 0 -  Change in appetite 0 0 -  Feeling bad or failure about yourself  0 0 -  Trouble concentrating 0 0 -  Moving slowly or fidgety/restless 0 0 -  Suicidal thoughts 0 0 -  PHQ-9 Score 0 0 -  Difficult doing work/chores Not difficult at all Not difficult at all -  Some recent data might be hidden     Social History   Tobacco Use  Smoking Status Former   Years: 28.00   Types: Cigarettes   Quit date: 12/12/1993   Years since quitting: 28.1  Smokeless Tobacco Never   BP Readings from Last 3 Encounters:  01/31/22 140/80  01/18/22 124/76  01/14/22 122/82   Pulse Readings from Last 3 Encounters:  01/31/22 66  01/18/22 71  01/14/22 (!) 58  Wt Readings from Last 3 Encounters:  01/31/22 174 lb 3.2 oz (79 kg)  01/18/22 177 lb 9.6 oz (80.6 kg)  01/14/22 177 lb (80.3 kg)   BMI Readings from Last 3 Encounters:  01/31/22 31.86 kg/m  01/18/22 32.48 kg/m  01/14/22 32.37 kg/m    Assessment/Interventions: Review of patient past medical history, allergies, medications, health status, including review of consultants reports, laboratory and other test data, was performed as part of comprehensive evaluation and provision of chronic care management services.   SDOH:  (Social Determinants of Health) assessments and interventions performed: Yes  SDOH Screenings   Alcohol Screen: Not on file  Depression (PHQ2-9): Low Risk    PHQ-2 Score: 0  Financial Resource Strain: Not on file  Food Insecurity: Not on file  Housing: Not on file  Physical Activity: Not on file  Social Connections: Not on file  Stress: Not on file  Tobacco Use: Medium Risk   Smoking Tobacco Use: Former   Smokeless Tobacco Use: Never   Passive Exposure: Not on file  Transportation Needs: Not on file    CCM Care Plan  Allergies  Allergen Reactions   Ezetimibe-Simvastatin Other (See Comments)    REACTION: Muscle aches (side effect)   Lipitor [Atorvastatin] Other (See Comments)    Leg  weakness    Claritin [Loratadine]     Possible cause of nightmares.     Tamiflu [Oseltamivir Phosphate] Other (See Comments)    nightmares   Pravastatin Sodium Other (See Comments)    REACTION: Muscle aches (side effect)   Sulfonamide Derivatives Nausea And Vomiting    Medications Reviewed Today     Reviewed by Colon Branch, CMA (Certified Medical Assistant) on 01/31/22 at 1428  Med List Status: <None>   Medication Order Taking? Sig Documenting Provider Last Dose Status Informant  acetaminophen (TYLENOL) 500 MG tablet [id number] Yes Take 500 mg by mouth every 6 (six) hours as needed. [provider] Taking Active   amiodarone (PACERONE) 200 MG tablet [id number] Yes TAKE 1/2 TABLET BY MOUTH EVERY DAY Leonie Man, MD Taking Active   aspirin 81 MG tablet [id number] Yes Take 1 tablet (81 mg total) by mouth daily. Tonia Ghent, MD Taking Active   carvedilol (COREG) 25 MG tablet [id number] Yes TAKE 1 TABLET BY MOUTH TWICE DAILY Tonia Ghent, MD Taking Active   Cholecalciferol (VITAMIN D) 1000 UNITS capsule [id number] Yes Take 1,000 Units by mouth daily. [provider] Taking Active Self           Med Note Fleet Contras Sep 14, 2017  6:33 AM)    furosemide (LASIX) 20 MG tablet [id number] Yes Take 1 tablet (20 mg total) by mouth daily. With extra $RemoveBe'20mg'[id number]$  if weight is increasing. Tonia Ghent, MD Taking Active   glucose blood (ACCU-CHEK AVIVA PLUS) test strip [id number] Yes USE TO TEST BLOOD SUGAR ONCE DAILY FOR DM E11.9 Tonia Ghent, MD Taking Active   JARDIANCE 10 MG TABS tablet [id number] Yes TAKE 1/2 TABLET(5 MG) BY MOUTH DAILY Leonie Man, MD Taking Active   Lancets (ACCU-CHEK MULTICLIX) lancets [id number] Yes USE ONE LANCET TO TEST BLOOD GLUCOSE DAILY FOR DM 250.00 Tonia Ghent, MD Taking Active   metFORMIN (GLUCOPHAGE) 500 MG tablet [id number] Yes TAKE 1/2 TABLET BY MOUTH DAILY WITH A MEAL Tonia Ghent, MD Taking Active   nystatin cream  (MYCOSTATIN) [id number] Yes Apply 1 application topically 2 (two) times daily. Tonia Ghent, MD Taking  Active   predniSONE (DELTASONE) 10 MG tablet [id number] Yes Take 1 a day for 5 days after breakfast. Don't take with aleve/ibuprofen. Tonia Ghent, MD Taking Active   rosuvastatin (CRESTOR) 10 MG tablet [id number] Yes Take 1 tablet (10 mg total) by mouth daily. Tonia Ghent, MD Taking Active   sacubitril-valsartan [facility]) 97-103 MG [id number] Yes Take 1 tablet by mouth 2 (two) times daily. Leonie Man, MD Taking Active   traMADol Veatrice Bourbon) 50 MG tablet [id number] Yes Take 1 tablet (50 mg total) by mouth 3 (three) times daily as needed. Tonia Ghent, MD Taking Active             Patient Active Problem List   Diagnosis Date Noted   Nightmare 12/22/2021   Viral URI with cough 12/14/2021   Onychomycosis of multiple toenails with type 2 diabetes mellitus and peripheral angiopathy ([place]) 11/16/2021   Pain due to onychomycosis of toenail of left foot 11/16/2021   Intermittent claudication of both lower extremities due to atherosclerosis ([place]) 08/22/2021   Ganglion cyst of wrist 08/22/2021   Rash 02/17/2021   On amiodarone therapy 08/21/2020   Hyperkalemia 08/21/2020   CKD (chronic kidney disease) stage 3, GFR 30-59 ml/min (HCC) 09/25/2019   Gastroesophageal reflux disease 06/19/2019   Abdominal pain 02/06/2019   Health care maintenance 11/14/2018   Joint pain 08/07/2018   Cough 06/17/2018   Sensorineural hearing loss (SNHL), bilateral 03/20/2018   Complex sleep apnea syndrome 10/05/2017   Chronic heart failure with preserved ejection fraction (HFpEF) ([place]) 09/08/2017   Dilated cardiomyopathy ([place]) 06/30/2017   Frequent PVCs 06/30/2017   Advance care planning 08/19/2014   Medicare annual wellness visit, subsequent 07/30/2013   KNEE PAIN, LEFT 11/09/2010   Type II diabetes mellitus with peripheral artery disease ([place]) 05/19/2010   HLD (hyperlipidemia) 05/19/2010    Essential hypertension 05/19/2010   EMPHYSEMA 05/19/2010   HIATAL HERNIA WITH REFLUX 05/19/2010   Ichthyosis 05/19/2010    Immunization History  Administered Date(s) Administered   Fluad Quad(high Dose 65+) 08/19/2021   Influenza Whole 08/12/2010   Influenza,inj,Quad PF,6+ Mos 08/19/2014, 08/15/2015, 09/02/2016, 08/05/2019   Influenza-Unspecified 09/11/2018   PFIZER Comirnaty(Gray Top)Covid-19 Tri-Sucrose Vaccine 03/23/2021   PFIZER(Purple Top)SARS-COV-2 Vaccination 02/09/2020, 02/29/2020, 09/15/2020   Pneumococcal Conjugate-13 03/10/2015   Pneumococcal Polysaccharide-23 05/26/2006   Td 06/14/2007   Zoster Recombinat (Shingrix) 08/12/2020, 10/12/2020   Zoster, Live 08/10/2006    Conditions to be addressed/monitored:  Hypertension, Hyperlipidemia, Diabetes, Heart Failure, and Chronic Kidney Disease, PAD  Care Plan : [place]  Updates made by Charlton Haws, [place] since 02/02/2022 12:00 AM     Problem: Hypertension, Hyperlipidemia, Diabetes, Heart Failure, and Chronic Kidney Disease, PAD   Priority: High     Long-Range Goal: Disease management   Start Date: 09/29/2021  Expected End Date: 09/29/2022  This Visit's Progress: On track  Recent Progress: On track  Priority: High  Note:   Current Barriers:  Unable to independently monitor therapeutic efficacy Unable to maintain control of HTN  Pharmacist Clinical Goal(s):  Patient will achieve adherence to monitoring guidelines and medication adherence to achieve therapeutic efficacy adhere to plan to optimize therapeutic regimen for HTN as evidenced by report of adherence to recommended medication management changes through collaboration with PharmD and provider.   Interventions: 1:1 collaboration with Tonia Ghent, MD regarding development and update of comprehensive plan of care as evidenced by provider attestation and co-signature Inter-disciplinary care team collaboration (see longitudinal plan of  care) Comprehensive  medication review performed; medication list updated in electronic medical record  Hyperlipidemia / PAD (LDL goal < 70) -Not ideally controlled - pt endorses compliance with new rosuvastatin (changed last visit from lovastatin due to leg cramps/LDL not at goal); repeat lipids have not been checked since switch -New occlusive PAD Sept 2022; considering Xarelto 2.5 mg -Current treatment: Rosuvastatin 10 mg daily - Appropriate, Query Effective, Safe, Accessible Aspirin 81 mg daily - Appropriate, Effective, Safe, Accessible -Previously tried/failed meds: lovastatin -[facility] -Educated on Cholesterol goals; Benefits of statin for ASCVD risk reduction; Strategies to manage statin-induced myalgias; benefits of aspirin in PAD -Counseled to continued daily exercise routine -Recommend to continue current medication; repeat lipid panel in 8-12 weeks  Diabetes (A1c goal <7%) -Controlled - A1c is at goal; pt normally checked BG once a week but has checked daily over past week in preparation for this appt; she endorses compliance with medications as below; she is working on pt assistance for [employer] with cardiology  -Current home glucose readings fasting glucose: 94-102 -Current medications: Metformin 500 mg - 1/2 tab daily - Appropriate, Effective, Safe, Accessible Jardiance 10 mg - 1/2 tab daily - Appropriate, Effective, Safe, Query Accessible Testing supplies -Educated on A1c and blood sugar goals; Complications of diabetes including kidney damage, retinal damage, and cardiovascular disease; benefits of metformin and Jardiance for DM and CV protection -Recommend to continue current medication  Heart Failure / Hypertension (Goal: BP < 140/90) -Controlled- pt completed course of higher-dose furosemide and weight has improved to baseline, swelling/SOB is better, she resumed 3x weekly dosing of furosemide and reports doing well; she  also reports BP is much better; she has not heard anything back about Jardiance PAP after cardiologist sent Rx in January -Sleep study pending, may have OSA -Wt 171 lbs (dry weight) -Current home BP/HR readings: SBP 120s -Hx frequent PVCs controlled with amiodarone -Last ejection fraction: 50-55% (Date: 01/28/22) -HF type: Diastolic; NYHA Class: I (no actitivty limitation) -Current treatment: Carvedilol 25 mg BID - Appropriate, Effective, Safe, Accessible Furosemide 20 mg MWF -Appropriate, Effective, Safe, Accessible Entresto 97-103 mg BID -Appropriate, Effective, Safe, Accessible Amiodarone 200 mg - 1/2 tab daily -Appropriate, Effective, Safe, Accessible Jardiance 10 mg -1/2 tab daily -Appropriate, Effective, Safe, Query Accessible -Educated on Benefits of medications for managing symptoms and prolonging life; Importance of blood pressure control; proper BP monitoring technique -Counseled on beta blocker side effects including exercise intolerance; it is likely that carvedilol is contributing to her early fatigue while walking; advised she can exercise early in AM and take carvedilol afterward -Recommend to continue current medication; check on Jardiance PAP, can enroll in [facility] (cardiomyopathy) if denied  Health Maintenance -Vaccine gaps: TDAP, Covid booster -Counseled to get bivalent covid booster at [employer]; discussed benefits/risks of TDAP booster, pt is low risk for TD and would like to forego 10-year booster  Patient Goals/Self-Care Activities Patient will:  - take medications as prescribed -focus on medication adherence by pill box -check blood pressure 3x weekly -collaborate with provider on medication access solutions (Jardiance)     Medication Assistance:  -Delene Loll - [employer] PAP approved through cardiology until 12/11/21 -Jardiance - [facility] PAP in process; forms faxed from cardiology office 11/08/21  Compliance/Adherence/Medication fill history: Care  Gaps: None  Star-Rating Drugs: Rosuvastatin - PDC 100% Metformin - PDC 56% (last filled 08/09/21 x 90 ds - pt taking 1/2 tab so actually 180 ds) Jardiance - PDC 97%  Patient's preferred pharmacy is:  [facility] [phone] - [address] AT [address] [place] [address] Phone: [phone] Fax: [phone]  Uses pill box? Yes - 30 day pill box Pt endorses 100% compliance  We discussed: Current pharmacy is preferred with insurance plan and patient is satisfied with pharmacy services Patient decided to: Continue current medication management strategy  Care Plan and Follow Up Patient Decision:  Patient agrees to Care Plan and Follow-up.  Plan: Telephone follow up appointment with care management team member scheduled for:  3 months  Charlene Brooke, PharmD, [facility] Clinical Pharmacist [facility] Primary Care [phone]

## 2022-02-02 NOTE — Progress Notes (Signed)
Chronic Care Management Pharmacy Note  02/02/2022 Name:  Natasha Woodard MRN:  [id number] DOB:  Sep 20, 1941  Summary: CCM F/U visit -BP at home (checked before meds in AM) has been high the past week (154/82-174/97); she denies headaches, dizziness, confusion; discussed dangers of high BP at length -Pt endorses compliance with new rosuvastatin 10 mg, she has stopped lovastatin  Recommendations/Changes made from today's visit: -Recommended adding hydralazine 25 mg TID - pt declined as she is very reluctant to add more meds, she wants to work on diet changes/wt loss to improve BP. I advised office visit to see PCP which she also declined due to distance to [place]. -Pt to check BP twice daily (after meds in AM, before meds at night) for 2 weeks  Plan: -[place] will call patient in 2 weeks for BP readings/med adherence -Pharmacist follow up televisit scheduled for 1 month   Subjective: Natasha Woodard is an 81 y.o. year old female who is a primary patient of Damita Dunnings, Elveria Rising, MD.  The CCM team was consulted for assistance with disease management and care coordination needs.    Engaged with patient by telephone for follow up visit in response to provider referral for pharmacy case management and/or care coordination services.   Consent to Services:  The patient was given information about Chronic Care Management services, agreed to services, and gave verbal consent prior to initiation of services.  Please see initial visit note for detailed documentation.   Patient Care Team: Tonia Ghent, MD as PCP - General (Family Medicine) Leonie Man, MD as PCP - Cardiology (Cardiology) Bensimhon, Shaune Pascal, MD as PCP - Advanced Heart Failure (Cardiology) Wellington Hampshire, MD as PCP - [facility] Cardiology (Cardiology) Hortencia Pilar, MD as Consulting Physician (Ophthalmology) Charlton Haws, [facility] as Pharmacist (Pharmacist)   Recent office visits: 01/14/22 Dr Damita Dunnings  OV: c/o wheezing, covid exposure (testing negative but husband testing positive); Rx prednisone 10 mg x 5 days. BNP elevated, increase furosemide 40 mg x 3 days, then alternate 40 mg/20 mg for 1 week  08/19/2021 - Elsie Stain, MD - Patient presented for follow up Diabetes. Labs: A1c. Change: metFORMIN (GLUCOPHAGE) 500 MG tablet. Take half tablet by mouth daily with meal (previously twice a day).   04/29/2021 -  Elsie Stain, MD - Patient presented for cough. Start: benzonatate (TESSALON) 200 MG capsule.  04/06/2021 - Elsie Stain, MD - [place] presented for Annual Wellness Visit. Stop: omeprazole (PRILOSEC) 20 MG capsule as no longer needed. Labs: CXR and EKG. Start: OTC Zyrtec.   Recent consult visits: 01/31/22 Dr Annamaria Boots (pulmonary) f/u OSA, HF. Schedule home sleep study.  01/18/22 Dr Fletcher Anon (cardiology): f/u PAD, HF. No med changes.  09/14/2021 - Kathlyn Sacramento, MD - Cardiology - eval PAD. No medication changes. Emphasized exercise.  09/09/2021 - Glenetta Hew, MD - Cardiology - Patient presented for follow up on Hyperlipidemia. Labs. EKG. Referral for Peripheral Vascular Consult. suspect now that we have identified what appears to be occlusive PAD, would probably want to convert from lovastatin to likely rosuvastatin.  We can address this in follow-up.  09/09/2021 - [facility] - Patient presented for [facility]. No other information.  04/27/2021 - [facility] - Patient presented for with cough, chest congestion.  03/31/2021 - Glori Bickers - Cardiology - Patient presented for congestive heart failure. No medication changes.   Hospital visits: None in previous 6 months   Objective:  Lab Results  Component Value  Date   CREATININE 1.28 (H) 01/14/2022   BUN 17 01/14/2022   GFR 45.93 (L) 03/19/2021   GFRNONAA 40 (L) 10/10/2020   GFRAA 49 (L) 10/09/2020   NA 144 01/14/2022   K 4.9 01/14/2022   CALCIUM 9.6 01/14/2022   CO2 30 01/14/2022   GLUCOSE 89  01/14/2022   Lab Results  Component Value Date/Time   HGBA1C 6.4 (H) 01/14/2022 03:51 PM   HGBA1C 6.2 (A) 08/19/2021 11:19 AM   HGBA1C 6.6 (H) 03/19/2021 11:21 AM   GFR 45.93 (L) 03/19/2021 11:21 AM   GFR 52.41 (L) 11/12/2019 09:21 AM    Last diabetic Eye exam:  Lab Results  Component Value Date/Time   HMDIABEYEEXA No Retinopathy 10/06/2020 12:00 AM   HMDIABEYEEXA No Retinopathy 10/06/2020 12:00 AM    Last diabetic Foot exam:  Lab Results  Component Value Date/Time   HMDIABFOOTEX yes 01/25/2011 12:00 AM     Lab Results  Component Value Date   CHOL 141 03/19/2021   HDL 42.10 03/19/2021   LDLCALC 81 03/19/2021   LDLDIRECT 53 08/10/2009   TRIG 89.0 03/19/2021   CHOLHDL 3 03/19/2021    Hepatic Function Latest Ref Rng & Units 01/14/2022 03/19/2021 10/10/2020  Total Protein 6.1 - 8.1 g/dL 6.6 6.5 6.1(L)  Albumin 3.5 - 5.2 g/dL - 3.9 3.5  AST 10 - 35 U/L 17 12 19   ALT 6 - 29 U/L 18 9 6   Alk Phosphatase 39 - 117 U/L - 56 58  Total Bilirubin 0.2 - 1.2 mg/dL 0.7 0.5 0.7  Bilirubin, Direct 0.00 - 0.40 mg/dL - - -    Lab Results  Component Value Date/Time   TSH 1.23 03/19/2021 11:21 AM   TSH 2.148 04/17/2020 03:08 PM   TSH 1.87 11/12/2019 09:21 AM   FREET4 1.32 (H) 04/17/2020 03:08 PM   FREET4 1.28 10/08/2018 11:14 AM    CBC Latest Ref Rng & Units 01/14/2022 03/19/2021 10/09/2020  WBC 3.8 - 10.8 Thousand/uL 4.3 4.3 3.9  Hemoglobin 11.7 - 15.5 g/dL 13.5 13.6 14.2  Hematocrit 35.0 - 45.0 % 39.9 41.8 44.9  Platelets 140 - 400 Thousand/uL 287 333.0 379    Lab Results  Component Value Date/Time   VD25OH 60 07/23/2013 10:13 AM    Clinical ASCVD: Yes  - PAD The ASCVD Risk score (Arnett DK, et al., 2019) failed to calculate for the following reasons:   The 2019 ASCVD risk score is only valid for ages 64 to 85    Lab Results  Component Value Date/Time   BNP 1,333 (H) 01/14/2022 03:51 PM   BNP 263.3 (H) 10/10/2019 12:55 PM   BNP 338.7 (H) 10/08/2018 11:15 AM   Depression  screen [facility] 2/9 04/08/2021 11/12/2019 11/15/2018  Decreased Interest 0 0 0  Down, Depressed, Hopeless 0 0 0  PHQ - 2 Score 0 0 0  Altered sleeping 0 0 -  Tired, decreased energy 0 0 -  Change in appetite 0 0 -  Feeling bad or failure about yourself  0 0 -  Trouble concentrating 0 0 -  Moving slowly or fidgety/restless 0 0 -  Suicidal thoughts 0 0 -  PHQ-9 Score 0 0 -  Difficult doing work/chores Not difficult at all Not difficult at all -  Some recent data might be hidden     Social History   Tobacco Use  Smoking Status Former   Years: 28.00   Types: Cigarettes   Quit date: 12/12/1993   Years since quitting: 28.1  Smokeless  Tobacco Never   BP Readings from Last 3 Encounters:  01/31/22 140/80  01/18/22 124/76  01/14/22 122/82   Pulse Readings from Last 3 Encounters:  01/31/22 66  01/18/22 71  01/14/22 (!) 58   Wt Readings from Last 3 Encounters:  01/31/22 174 lb 3.2 oz (79 kg)  01/18/22 177 lb 9.6 oz (80.6 kg)  01/14/22 177 lb (80.3 kg)   BMI Readings from Last 3 Encounters:  01/31/22 31.86 kg/m  01/18/22 32.48 kg/m  01/14/22 32.37 kg/m    Assessment/Interventions: Review of patient past medical history, allergies, medications, health status, including review of consultants reports, laboratory and other test data, was performed as part of comprehensive evaluation and provision of chronic care management services.   SDOH:  (Social Determinants of Health) assessments and interventions performed: Yes  SDOH Screenings   Alcohol Screen: Not on file  Depression (PHQ2-9): Low Risk    PHQ-2 Score: 0  Financial Resource Strain: Not on file  Food Insecurity: Not on file  Housing: Not on file  Physical Activity: Not on file  Social Connections: Not on file  Stress: Not on file  Tobacco Use: Medium Risk   Smoking Tobacco Use: Former   Smokeless Tobacco Use: Never   Passive Exposure: Not on file  Transportation Needs: Not on file    CCM Care Plan  Allergies   Allergen Reactions   Ezetimibe-Simvastatin Other (See Comments)    REACTION: Muscle aches (side effect)   Lipitor [Atorvastatin] Other (See Comments)    Leg weakness    Claritin [Loratadine]     Possible cause of nightmares.     Tamiflu [Oseltamivir Phosphate] Other (See Comments)    nightmares   Pravastatin Sodium Other (See Comments)    REACTION: Muscle aches (side effect)   Sulfonamide Derivatives Nausea And Vomiting    Medications Reviewed Today     Reviewed by Colon Branch, CMA (Certified Medical Assistant) on 01/31/22 at 1428  Med List Status: <None>   Medication Order Taking? Sig Documenting Provider Last Dose Status Informant  acetaminophen (TYLENOL) 500 MG tablet [id number] Yes Take 500 mg by mouth every 6 (six) hours as needed. [provider] Taking Active   amiodarone (PACERONE) 200 MG tablet [id number] Yes TAKE 1/2 TABLET BY MOUTH EVERY DAY Leonie Man, MD Taking Active   aspirin 81 MG tablet [id number] Yes Take 1 tablet (81 mg total) by mouth daily. Tonia Ghent, MD Taking Active   carvedilol (COREG) 25 MG tablet [id number] Yes TAKE 1 TABLET BY MOUTH TWICE DAILY Tonia Ghent, MD Taking Active   Cholecalciferol (VITAMIN D) 1000 UNITS capsule [id number] Yes Take 1,000 Units by mouth daily. [provider] Taking Active Self           Med Note Fleet Contras Sep 14, 2017  6:33 AM)    furosemide (LASIX) 20 MG tablet [id number] Yes Take 1 tablet (20 mg total) by mouth daily. With extra $RemoveBe'20mg'lahxRSgrL$  if weight is increasing. Tonia Ghent, MD Taking Active   glucose blood (ACCU-CHEK AVIVA PLUS) test strip [id number] Yes USE TO TEST BLOOD SUGAR ONCE DAILY FOR DM E11.9 Tonia Ghent, MD Taking Active   JARDIANCE 10 MG TABS tablet [id number] Yes TAKE 1/2 TABLET(5 MG) BY MOUTH DAILY Leonie Man, MD Taking Active   Lancets [facility] MULTICLIX) lancets [id number] Yes USE ONE LANCET TO TEST BLOOD GLUCOSE DAILY FOR DM 250.00 Tonia Ghent, MD Taking  Active   metFORMIN (GLUCOPHAGE) 500  MG tablet [id number] Yes TAKE 1/2 TABLET BY MOUTH DAILY WITH A MEAL Tonia Ghent, MD Taking Active   nystatin cream (MYCOSTATIN) [id number] Yes Apply 1 application topically 2 (two) times daily. Tonia Ghent, MD Taking Active   predniSONE (DELTASONE) 10 MG tablet [id number] Yes Take 1 a day for 5 days after breakfast. Don't take with aleve/ibuprofen. Tonia Ghent, MD Taking Active   rosuvastatin (CRESTOR) 10 MG tablet [id number] Yes Take 1 tablet (10 mg total) by mouth daily. Tonia Ghent, MD Taking Active   sacubitril-valsartan [facility]) 97-103 MG [id number] Yes Take 1 tablet by mouth 2 (two) times daily. Leonie Man, MD Taking Active   traMADol Veatrice Bourbon) 50 MG tablet [id number] Yes Take 1 tablet (50 mg total) by mouth 3 (three) times daily as needed. Tonia Ghent, MD Taking Active             Patient Active Problem List   Diagnosis Date Noted   Nightmare 12/22/2021   Viral URI with cough 12/14/2021   Onychomycosis of multiple toenails with type 2 diabetes mellitus and peripheral angiopathy ([place]) 11/16/2021   Pain due to onychomycosis of toenail of left foot 11/16/2021   Intermittent claudication of both lower extremities due to atherosclerosis ([place]) 08/22/2021   Ganglion cyst of wrist 08/22/2021   Rash 02/17/2021   On amiodarone therapy 08/21/2020   Hyperkalemia 08/21/2020   CKD (chronic kidney disease) stage 3, GFR 30-59 ml/min (HCC) 09/25/2019   Gastroesophageal reflux disease 06/19/2019   Abdominal pain 02/06/2019   Health care maintenance 11/14/2018   Joint pain 08/07/2018   Cough 06/17/2018   Sensorineural hearing loss (SNHL), bilateral 03/20/2018   Complex sleep apnea syndrome 10/05/2017   Chronic heart failure with preserved ejection fraction (HFpEF) ([place]) 09/08/2017   Dilated cardiomyopathy ([place]) 06/30/2017   Frequent PVCs 06/30/2017   Advance care planning 08/19/2014   Medicare annual wellness visit, subsequent  07/30/2013   KNEE PAIN, LEFT 11/09/2010   Type II diabetes mellitus with peripheral artery disease ([place]) 05/19/2010   HLD (hyperlipidemia) 05/19/2010   Essential hypertension 05/19/2010   EMPHYSEMA 05/19/2010   HIATAL HERNIA WITH REFLUX 05/19/2010   Ichthyosis 05/19/2010    Immunization History  Administered Date(s) Administered   Fluad Quad(high Dose 65+) 08/19/2021   Influenza Whole 08/12/2010   Influenza,inj,Quad PF,6+ Mos 08/19/2014, 08/15/2015, 09/02/2016, 08/05/2019   Influenza-Unspecified 09/11/2018   PFIZER Comirnaty(Gray Top)Covid-19 Tri-Sucrose Vaccine 03/23/2021   PFIZER(Purple Top)SARS-COV-2 Vaccination 02/09/2020, 02/29/2020, 09/15/2020   Pneumococcal Conjugate-13 03/10/2015   Pneumococcal Polysaccharide-23 05/26/2006   Td 06/14/2007   Zoster Recombinat (Shingrix) 08/12/2020, 10/12/2020   Zoster, Live 08/10/2006    Conditions to be addressed/monitored:  Hypertension, Hyperlipidemia, Diabetes, Heart Failure, and Chronic Kidney Disease, PAD  There are no care plans that you recently modified to display for this patient.   Medication Assistance:  -Delene Loll - [employer] PAP approved through cardiology until 12/11/21 -Jardiance - BI Cares PAP in process; forms faxed from cardiology office 11/08/21  Compliance/Adherence/Medication fill history: Care Gaps: None  Star-Rating Drugs: Rosuvastatin - PDC 100% Metformin - PDC 56% (last filled 08/09/21 x 90 ds) Jardiance - PDC 97%  Patient's preferred pharmacy is:  [facility], [place] - [address] AT [address] [place] [address] Phone: [phone] Fax: [phone]  [facility], [place] [address] [place] [address] Phone: [phone] Fax: [phone]  Uses pill box? Yes -  30 day pill box Pt endorses 100% compliance  We discussed: Current pharmacy is preferred with insurance  plan and patient is satisfied with pharmacy services Patient decided to: Continue current medication management strategy  Care Plan and Follow Up Patient Decision:  Patient agrees to Care Plan and Follow-up.  Plan: Telephone follow up appointment with care management team member scheduled for:  1 month  Charlene Brooke, PharmD, [place], CPP Clinical Pharmacist [facility] [phone]   Current Barriers:  Unable to independently monitor therapeutic efficacy Unable to maintain control of HTN  Pharmacist Clinical Goal(s):  Patient will achieve adherence to monitoring guidelines and medication adherence to achieve therapeutic efficacy adhere to plan to optimize therapeutic regimen for HTN as evidenced by report of adherence to recommended medication management changes through collaboration with PharmD and provider.   Interventions: 1:1 collaboration with Tonia Ghent, MD regarding development and update of comprehensive plan of care as evidenced by provider attestation and co-signature Inter-disciplinary care team collaboration (see longitudinal plan of care) Comprehensive medication review performed; medication list updated in electronic medical record  Hyperlipidemia / PAD (LDL goal < 70) -Not ideally controlled - pt endorses compliance with new rosuvastatin (changed last visit from lovastatin due to leg cramps/LDL not at goal); repeat lipids have not been checked since switch -New occlusive PAD Sept 2022; considering Xarelto 2.5 mg -Current treatment: Rosuvastatin 10 mg daily Aspirin 81 mg daily -Previously tried/failed meds: lovastatin -[facility] -Educated on Cholesterol goals; Benefits of statin for ASCVD risk reduction; Strategies to manage statin-induced myalgias; benefits of aspirin in PAD -Counseled to continued daily exercise routine -Recommend to continue current medication; repeat lipid panel in 8-12  weeks  Diabetes (A1c goal <7%) -Controlled - A1c is at goal; pt normally checked BG once a week but has checked daily over past week in preparation for this appt; she endorses compliance with medications as below; she is working on pt assistance for [employer] with cardiology  -Current home glucose readings fasting glucose: 94-102 -Current medications: Metformin 500 mg - 1/2 tab daily Jardiance 10 mg - 1/2 tab daily Testing supplies -Educated on A1c and blood sugar goals; Complications of diabetes including kidney damage, retinal damage, and cardiovascular disease; benefits of metformin and Jardiance for DM and CV protection -Recommend to continue current medication  Heart Failure / Hypertension (Goal: BP < 140/90) -Not ideally controlled - pt endorses compliance with medications as prescribed; she has been checking BP in AM before meds for the past week and readings have been elevated; she also reports she has trouble exercising after taking her BP meds but feels better if she exercises before taking meds -Current home BP/HR readings:  12/5: 178/77 12/6: 160/77 12/7: 172/83 12/8: 198/102 154/82 12/9: 174/97 155/80 -Hx frequent PVCs controlled with amiodarone -Last ejection fraction: 50-55% (Date: 01/28/22) -HF type: Diastolic; NYHA Class: I (no actitivty limitation) -Current treatment: Carvedilol 25 mg BID Furosemide 20 mg daily, extra 20 mg with weight gain Entresto 97-103 mg BID Amiodarone 200 mg - 1/2 tab daily Jardiance 10 mg -1/2 tab daily -Educated on Benefits of medications for managing symptoms and prolonging life; Importance of blood pressure control; proper BP monitoring technique -Pt is taking 2 BID medications (Entresto, carvedilol) and she reports very rarely missing PM doses - these PM doses should be controlling BP overnight and into morning, so BP before AM doses should not be significantly higher than other times of day; concern for persistently elevated BP  on a daily  basis -Counseled on beta blocker side effects including exercise intolerance; it is likely that carvedilol is contributing to her early fatigue while walking; advised she can exercise early in AM and take carvedilol afterward -Recommended adding hydralazine 25 mg TID; pt is resistant to adding a new medication and wanted time to focus on diet changes to improve BP; will defer med changes 1 month -Recommended to check BP twice daily - AM and bedtime  Health Maintenance -Vaccine gaps: TDAP, Covid booster -Counseled to get bivalent covid booster at [facility]; discussed benefits/risks of TDAP booster, pt is low risk for TD and would like to forego 10-year booster -Current therapy:  Vitamin D 1000 IU daily Tramadol 50 mg BID prn Tylenol 500 mg  -Patient is satisfied with current therapy and denies issues -Recommended to continue current medication  Patient Goals/Self-Care Activities Patient will:  - take medications as prescribed -focus on medication adherence by pill box -check blood pressure twice daily (AM and bedtime) -collaborate with provider on medication access solutions

## 2022-02-07 DIAGNOSIS — M25511 Pain in right shoulder: Secondary | ICD-10-CM | POA: Diagnosis not present

## 2022-02-08 ENCOUNTER — Telehealth: Payer: Self-pay | Admitting: Pharmacist

## 2022-02-08 DIAGNOSIS — I10 Essential (primary) hypertension: Secondary | ICD-10-CM | POA: Diagnosis not present

## 2022-02-08 DIAGNOSIS — E1151 Type 2 diabetes mellitus with diabetic peripheral angiopathy without gangrene: Secondary | ICD-10-CM | POA: Diagnosis not present

## 2022-02-08 DIAGNOSIS — E782 Mixed hyperlipidemia: Secondary | ICD-10-CM | POA: Diagnosis not present

## 2022-02-08 DIAGNOSIS — I5032 Chronic diastolic (congestive) heart failure: Secondary | ICD-10-CM

## 2022-02-08 NOTE — Progress Notes (Signed)
Called [facility]. Pharmacist was unable to complete the transaction with [employer] as the insurance patient has is Scient[employer], forensic. It keeps denying it. Pharmacist tried to run it only with [employer] (with no insurance indicated) and it will not accept it either.   [employer] grant approval 01/09/22 - 01/08/23. ID: [id number] BIN: [id number] PCN: [id number] GRP: [id number]  Al Corpus, CPP notified  Claudina Lick, RMA Clinical Pharmacy Assistant [phone]  Time Spent: 10 Minutes

## 2022-02-08 NOTE — Telephone Encounter (Signed)
Per patient, annual income is >$30,000 so she will not qualify for LIS. Rather than fight with BI cares, we decided to enroll in [employer] for Cardiomyopathy, which covers Jardiance. Enrolled patient online and she was approved.  [employer] grant approval 01/09/22 - 01/08/23. ID: [id number] BIN: [id number] PCN: [id number] GRP: [id number]

## 2022-02-08 NOTE — Telephone Encounter (Signed)
Patient reports she is having trouble getting earwax removal appointments covered by her insurance. She states that she needs a letter from her PCP stating earwax removal is medically necessary for her continued use of hearing aids.

## 2022-02-09 NOTE — Telephone Encounter (Signed)
Spoke with patient about letter needed for ear wax removal; needed fax number where to send this too. Patient was confused on what she needed and did not know we did ear wax removal in our office. She had been doing this where she gets her hearing aids from and was no longer covered but insurance there. Patient stated she will come see Dr. Damita Dunnings to have this done and scheduled an appt for 02/24/22 at 2:00 pm. ?

## 2022-02-09 NOTE — Telephone Encounter (Signed)
Letter done.  Thanks.  Please send to patient.   ?

## 2022-02-15 ENCOUNTER — Telehealth: Payer: Self-pay | Admitting: Cardiology

## 2022-02-15 ENCOUNTER — Encounter: Payer: Self-pay | Admitting: Cardiology

## 2022-02-15 ENCOUNTER — Ambulatory Visit: Payer: Medicare Other | Admitting: Podiatry

## 2022-02-15 MED ORDER — ENTRESTO 97-103 MG PO TABS: 1.0000 | ORAL_TABLET | Freq: Two times a day (BID) | ORAL | 0 refills | Status: DC

## 2022-02-15 NOTE — Telephone Encounter (Signed)
?  Patient called office in regards to her earlier MyChart conversation regarding her Entresto. She is okay with the nurses suggestion of calling in a prescription for one week of Entresto to the [facility] on file. Please see MyChart message ?

## 2022-02-15 NOTE — Telephone Encounter (Signed)
Called pt verified pharmacy to send entresto 97-103 mg script to.  Pt only request 1 week script as is scared of price of medication.  Advised pt to see if there are any online coupons that could help with cost.  Script sent to pharmacy of pt choice.  ?

## 2022-02-17 ENCOUNTER — Telehealth: Payer: Self-pay

## 2022-02-17 NOTE — Telephone Encounter (Signed)
completed

## 2022-02-17 NOTE — Progress Notes (Signed)
Called [facility]; patient was able to pick up Jardiance through the [employer] for $0 copay.  ? ?[employer] grant approval 01/09/22 - 01/08/23. ?ID: [id number] ?BIN: [id number] ?PCN: [id number] ?GRP: [id number] ? ?Al Corpus, CPP notified ? ?Claudina Lick, RMA ?Clinical Pharmacy Assistant ?[phone] ? ? ?

## 2022-02-24 ENCOUNTER — Ambulatory Visit: Payer: Medicare Other | Admitting: Family Medicine

## 2022-02-28 ENCOUNTER — Encounter: Payer: Self-pay | Admitting: Family Medicine

## 2022-02-28 ENCOUNTER — Ambulatory Visit (INDEPENDENT_AMBULATORY_CARE_PROVIDER_SITE_OTHER): Payer: Medicare Other | Admitting: Family Medicine

## 2022-02-28 ENCOUNTER — Other Ambulatory Visit: Payer: Self-pay

## 2022-02-28 DIAGNOSIS — H6980 Other specified disorders of Eustachian tube, unspecified ear: Secondary | ICD-10-CM | POA: Diagnosis not present

## 2022-02-28 DIAGNOSIS — I5032 Chronic diastolic (congestive) heart failure: Secondary | ICD-10-CM | POA: Diagnosis not present

## 2022-02-28 DIAGNOSIS — H6990 Unspecified Eustachian tube disorder, unspecified ear: Secondary | ICD-10-CM

## 2022-02-28 MED ORDER — FLUTICASONE PROPIONATE 50 MCG/ACT NA SUSP: 2.0000 | Freq: Every day | NASAL | Status: DC

## 2022-02-28 MED ORDER — FUROSEMIDE 20 MG PO TABS: 20.0000 mg | ORAL_TABLET | ORAL | Status: DC

## 2022-02-28 NOTE — Progress Notes (Signed)
This visit occurred during the SARS-CoV-2 public health emergency.  Safety protocols were in place, including screening questions prior to the visit, additional usage of staff PPE, and extensive cleaning of exam room while observing appropriate contact time as indicated for disinfecting solutions. ? ?HOH, d/w pt. she was asking if she had wax buildup and wanted evaluation.  See exam. ? ?Discussed with patient about exertional shortness of breath.  [facility] [facility] [place] [facility].  She can walk a mile but she has to stop and take breaks.  She is noted to be taking Lasix 3 times a week.  She is compliant with her other medications.  She sees Dr. Ellyn Hack with cardiology. ? ?Higher dose of tramadol helped.  She is walking with less pain with extra dose of tramadol.  No ADE on med.   ? ?Meds, vitals, and allergies reviewed.  ? ?ROS: Per HPI unless specifically indicated in ROS section  ? ?GEN: nad, alert and oriented ?HEENT: ncat, no significant amount of cerumen in the canals but she has eustachian tube dysfunction noted on exam.  She does not have tympanic membrane movement with Valsalva.  Anatomy discussed with patient in detail. ?NECK: supple w/o LA ?CV: rrr.   ?PULM: ctab, no inc wob ?ABD: soft, +bs ?EXT: no BLE edema ?SKIN: Chronic skin changes noted at baseline.  Well-perfused ? ?30 minutes were devoted to patient care in this encounter (this includes time spent reviewing the patient's file/history, interviewing and examining the patient, counseling/reviewing plan with patient).  ? ?

## 2022-02-28 NOTE — Patient Instructions (Signed)
No wax in the left canal. Minimal in the right canal.  No need to remove it.  ? ?Likely Eustachian tube dysfunction.  Gently try to "pop" your ears a few times a day.  Use flonase, 2 sprays in each nostril once a day.  ? ?Temporary furosemide change: ?Take 2 furosemide tabs on Tuesday 03/01/22.   ?If needed, take 2 tabs on Thursday 03/03/22 and again on 03/05/22.  ? ?If your breathing is better, then cut back to 1 tab on Tuesday/Thursday/Saturday. I'll update cardiology.  ? ?Take care.  Glad to see you. ?

## 2022-03-02 DIAGNOSIS — H6990 Unspecified Eustachian tube disorder, unspecified ear: Secondary | ICD-10-CM | POA: Insufficient documentation

## 2022-03-02 DIAGNOSIS — H6980 Other specified disorders of Eustachian tube, unspecified ear: Secondary | ICD-10-CM | POA: Insufficient documentation

## 2022-03-02 NOTE — Assessment & Plan Note (Signed)
Discussed options.  Given her shortness of breath I think it makes sense to try a slightly higher dose of Lasix temporarily.  I will update cardiology as FYI.  See after visit summary. ? ?Temporary furosemide change: ?Take 2 furosemide tabs on Tuesday 03/01/22.   ?If needed, take 2 tabs on Thursday 03/03/22 and again on 03/05/22.  ? ?If breathing is better, then cut back to 1 tab on Tuesday/Thursday/Saturday.  ?

## 2022-03-02 NOTE — Assessment & Plan Note (Signed)
No wax in the left canal. Minimal in the right canal.  No need to remove it.  ?Likely Eustachian tube dysfunction.  Perform Valsalva a few times a day.  Use flonase, 2 sprays in each nostril once a day.  ?She can update me as needed. ?

## 2022-03-07 ENCOUNTER — Encounter: Payer: Self-pay | Admitting: Family Medicine

## 2022-03-11 ENCOUNTER — Other Ambulatory Visit (HOSPITAL_COMMUNITY): Payer: Self-pay | Admitting: Internal Medicine

## 2022-03-13 MED ORDER — FUROSEMIDE 20 MG PO TABS: 20.0000 mg | ORAL_TABLET | ORAL | Status: DC

## 2022-03-13 NOTE — Telephone Encounter (Signed)
OK thanks -- Every time I see her, I explain what she should do in these situations -- she just forgets.   ? ?DH ? ? ?

## 2022-03-13 NOTE — Telephone Encounter (Signed)
FYI on Lasix dose.  I adjusted it upward in the meantime, if her weight is up. ?

## 2022-03-14 ENCOUNTER — Telehealth: Payer: Self-pay

## 2022-03-14 NOTE — Progress Notes (Addendum)
? ? ?Chronic Care Management ?Pharmacy Assistant  ? ?Name: Natasha Woodard  MRN: [id number] DOB: 1941-10-11 ? ?Reason for Encounter: CCM (General Adherence) ?  ?Recent office visits:  ?03/07/22 Elsie Stain, MD Message: Lasix: Take 20 mg if morning weight is below 170 pounds.  Take 40 mg if morning weight is 170 pounds or higher ?02/28/22 Elsie Stain, MD Chronic heart failure. Start: Flonase 50 mcg. Change: Lasix 20 mg 3 times weekly. Stop due to completed course: Nystatin 1000000 unit and Prednisone 10 mg. ? ?Recent consult visits:  ?None since last CCM contact ? ?Hospital visits:  ?None in previous 6 months ? ?Medications: ?Outpatient Encounter Medications as of 03/14/2022  ?Medication Sig  ? acetaminophen (TYLENOL) 500 MG tablet Take 500 mg by mouth every 6 (six) hours as needed.  ? amiodarone (PACERONE) 200 MG tablet TAKE 1/2 TABLET BY MOUTH EVERY DAY  ? aspirin 81 MG tablet Take 1 tablet (81 mg total) by mouth daily.  ? carvedilol (COREG) 25 MG tablet TAKE 1 TABLET BY MOUTH TWICE DAILY  ? Cholecalciferol (VITAMIN D) 1000 UNITS capsule Take 1,000 Units by mouth daily.  ? fluticasone (FLONASE) 50 MCG/ACT nasal spray Place 2 sprays into both nostrils daily.  ? furosemide (LASIX) 20 MG tablet Take 1-2 tablets (20-40 mg total) by mouth 3 (three) times a week. Take 20 mg if morning weight is below 170 pounds.  Take 40 mg if morning weight is 170 pounds or higher.  ? glucose blood (ACCU-CHEK AVIVA PLUS) test strip USE TO TEST BLOOD SUGAR ONCE DAILY FOR DM E11.9  ? JARDIANCE 10 MG TABS tablet TAKE 1/2 TABLET(5 MG) BY MOUTH DAILY  ? Lancets (ACCU-CHEK MULTICLIX) lancets USE ONE LANCET TO TEST BLOOD GLUCOSE DAILY FOR DM 250.00  ? metFORMIN (GLUCOPHAGE) 500 MG tablet TAKE 1/2 TABLET BY MOUTH DAILY WITH A MEAL  ? rosuvastatin (CRESTOR) 10 MG tablet Take 1 tablet (10 mg total) by mouth daily.  ? sacubitril-valsartan (ENTRESTO) 97-103 MG Take 1 tablet by mouth 2 (two) times daily.  ? traMADol (ULTRAM) 50 MG tablet Take 1  tablet (50 mg total) by mouth 3 (three) times daily as needed.  ? ?No facility-administered encounter medications on file as of 03/14/2022.  ? ?Contacted AZRAH CANDELL on 03/15/2022 for general disease state and medication adherence call.  ? ?Patient is not more than 5 days past due for refill on the following medications per chart history: ? ?[facility] Medications: ?Medication Name/mg  Last Fill Days Supply ?Jardiance 10 mg   03/14/2022` 90 ?Metformin 500 mg   03/14/2022 90 ?Rosuvastatin 10 mg   12/21/2021 90 ? ?What concerns do you have about your medications? Patient was concerned with the cost of her Vania Rea and Delene Loll, but thanked me for helping me get those for her through patient assistance.  ? ?The patient denies side effects with their medications.  ? ?How often do you forget or accidentally miss a dose? Never ? ?Do you use a pillbox? Yes ? ?Are you having any problems getting your medications from your pharmacy? No ? ?Has the cost of your medications been a concern? No ? ?Since last visit with CPP, the following interventions have been made.  ?Lasix: Take 20 mg if morning weight is below 170 pounds.   ?Take 40 mg if morning weight is 170 pounds or higher ?Patient stated she was taking her Lasix a new way; patient informed me she was taking two tablets daily. I counseled patient to take 1 tablet if her  weight was under 170 lbs and two if it was above 170 lbs. Patient thanked for me noticing she had been taking it incorrectly.  ? ?The patient has not had an ED visit since last contact.  ? ?The patient reports the following problems with their health.  Patient states she is still having a lot of shortness of breath. Patient has a cardiology appointment tomorrow where she is going to discuss her concerns. Patient is walking 1 mile everyday at the track as she was asked to do. Patient stated it takes her 1 hour to complete the mile. I commended her on walking every day and praised her even if it does take an  hour she is still completing it.  ? ?Patient denies concerns or questions for Charlene Brooke, PharmD at this time.  ? ?Patient did have a concern that for the past three months she has been having itching in the crevice of her lower stomach and the inside of her legs. She stated it is similar to an infection, but it is not coming from her vagina. It is only in the folds of her stomach and her legs. The areas will get damp as in a little sweaty, but have no odor. Patient has been using Aveeno cream on the areas, but it only works temporarily. The itching/infection does not fully go away. She is wondering if there is something can use. I advised patient that she would need to call the office to be seen by Dr. Damita Dunnings. Patient understood and stated she would call for an appointment.  ? ?Care Gaps: ?Annual wellness visit in last year? Yes 04/06/2021 ?Most Recent BP reading:  124/80 on 02/28/2022 ? ?If Diabetic: ?Most recent A1C reading: 6.4 on 01/14/2022 ?Last eye exam / retinopathy screening: 10/06/2020 ?Last diabetic foot exam: Up to date ? ?Upcoming appointments: ?CCM appointment on 05/04/2022 ? ?Charlene Brooke, CPP notified ? ?Marijean Niemann, RMA ?Clinical Pharmacy Assistant ?[phone] ? ? ? ? ? ? ? ? ?

## 2022-03-16 ENCOUNTER — Ambulatory Visit: Payer: Medicare Other | Admitting: Cardiology

## 2022-03-16 ENCOUNTER — Encounter: Payer: Self-pay | Admitting: Cardiology

## 2022-03-16 VITALS — BP 134/84 | HR 70 | Ht 62.0 in | Wt 173.4 lb

## 2022-03-16 DIAGNOSIS — E1151 Type 2 diabetes mellitus with diabetic peripheral angiopathy without gangrene: Secondary | ICD-10-CM | POA: Diagnosis not present

## 2022-03-16 DIAGNOSIS — I70213 Atherosclerosis of native arteries of extremities with intermittent claudication, bilateral legs: Secondary | ICD-10-CM

## 2022-03-16 DIAGNOSIS — Z79899 Other long term (current) drug therapy: Secondary | ICD-10-CM

## 2022-03-16 DIAGNOSIS — I5032 Chronic diastolic (congestive) heart failure: Secondary | ICD-10-CM

## 2022-03-16 DIAGNOSIS — I10 Essential (primary) hypertension: Secondary | ICD-10-CM

## 2022-03-16 DIAGNOSIS — E785 Hyperlipidemia, unspecified: Secondary | ICD-10-CM | POA: Diagnosis not present

## 2022-03-16 DIAGNOSIS — I420 Dilated cardiomyopathy: Secondary | ICD-10-CM | POA: Diagnosis not present

## 2022-03-16 DIAGNOSIS — I493 Ventricular premature depolarization: Secondary | ICD-10-CM

## 2022-03-16 DIAGNOSIS — E1169 Type 2 diabetes mellitus with other specified complication: Secondary | ICD-10-CM | POA: Diagnosis not present

## 2022-03-16 NOTE — Progress Notes (Signed)
? ?Primary Care Provider: Duncan, Graham S, MD ?Cardiologist:  , MD ?Adv CHF: Daniel Bensimhon, MD ?Electrophysiologist: None ? ?Clinic Note: ?No chief complaint on file. ? ?=================================== ? ?ASSESSMENT/PLAN  ? ?Problem List Items Addressed This Visit   ? ?  ? Cardiology Problems  ? Dilated cardiomyopathy (HCC) (Chronic)  ?  Thought to be related to frequent PVCs.  She had class III CHF symptoms associated with this reduced EF with significant volume overload. ? ?She was started on amiodarone for PVC suppression, and is now on 100 mg daily amiodarone along with guideline directed medical therapy with carvedilol, Entresto at max doses along with Jardiance at appropriate dose.  Her EF is now back in the normal range with no RWMA.  She has a nonischemic evaluation with no active angina. ? ?Spironolactone was discontinued because of hyperkalemia, and she has not required a lot of diuretic since initiation of Jardiance with exception of in the last month or 2 where she had a slight exacerbation. ? ?Her major concerning symptoms that she has when she has an exacerbation of heart failure is edema with some orthopnea.  Natasha Woodard has a very low level of understanding about her condition despite extensive amount of time spent with her. ?  ?  ? Relevant Orders  ? EKG 12-Lead (Completed)  ? Frequent PVCs (Chronic)  ?  On amiodarone for PVC suppression which led to the near resolution of her cardiomyopathy.  On amiodarone at maintenance 100 mg daily along with carvedilol 25 mg twice daily.  Interestingly, she was very asymptomatic of the PVCs, just noted CHF ?  ?  ? Relevant Orders  ? EKG 12-Lead (Completed)  ? Chronic heart failure with preserved ejection fraction (HFpEF) (HCC) - Primary (Chronic)  ?  She has had a little bit of a setback with acute exacerbation managed expertly by her PCP with additional doses of diuretic.  She now seems to be back down to her stable dry weight.  She feels  pretty good today and her weight at home was 170 pounds.  I think this is a good easy starting place for "dry weight". ? ?She is otherwise on a pretty stable HFpEF regimen with carvedilol 25 mg twice daily, Entresto 97-103 mg twice daily, Jardiance 10 mg daily ? ?She is currently taking furosemide 20 mg 3 days a week -> and has had a recent exacerbation. ?Because her potassium level has been somewhat elevated, she is no longer on spironolactone.  (If she needs more Lasix, then maybe we can actually restart low-dose spironolactone) ? ?I again reiterated the same information that her PCP only discussed, and that we have is discussed every single time I see her.  We discussed sliding scale dosing of Lasix based on daily  ?Again we wrote out all instructions.=> Strenuously reiterated the importance of keeping the AVS out with the discharge instructions posted on her refrigerator to avoid further confusion. ? ?Dry Home is  weight  170 lb ? ?  You' re now taking  Lasix ( furosemide) 20 mg  Tuesday,Thursday,Saturday ? ?If you gain weight  overnight 3 lbs or more  take 20 mg lasix ( furosemide) extra even if it isnot your day to take lasix ( furosemide) --take regardless of your standard dose of Furosemide until you go back to 170 lbs. ? ? ?If you are 5 lbs over 170 lbs ( meaning 175 lbs)  then you will  take  40 mg of Lasix ( furosemide ) that   day thereafter take 20 mg extra each day until you get back to 170 lbs. ? ?  ?  ? Relevant Orders  ? EKG 12-Lead (Completed)  ? Hyperlipidemia associated with type 2 diabetes mellitus (HCC) (Chronic)  ?  The she should be due for follow-up labs to be checked soon.  Last lab was in April 2022 to showing LDL 81 which is pretty much at goal for her.  She is on low-dose rosuvastatin and tolerating well. ? ?She has had some PAD diagnosis and therefore would probably target LDL less than 70. ?Await lab results. ? ?Metformin and Jardiance.  Most recent A1c was 6.4.  Relatively well  controlled. ?  ?  ? Type II diabetes mellitus with peripheral artery disease (HCC) (Chronic)  ? Essential hypertension (Chronic)  ?  BP actually looks pretty good today.  Borderline high but without having to add another medication, would simply allow mild permissive pressures. ? ?If she requires more than 3 days a week furosemide, would need may want to add low-dose spironolactone which will also help with blood pressure and diuresis.  (Would need to closely follow-up with testing) ?  ?  ?  ? Other  ? Intermittent claudication of both lower extremities due to atherosclerosis (HCC) (Chronic)  ?  Plan per Dr. Fletcher Anon -- conservative management -> encourage continued walking (even if it means frequent stops).  -- Also, could consider the use of stair climber or elliptical trainers which would help some with collateralization's, but may be more tolerable with her OA pains.  I also did recommend that she continue to do the walking because it helps out with her bone density. ? ?She has bilateral SFA occlusion on Dopplers.  She probably would not be a good candidate for bilateral femoropopliteal bypass unless she has limiting claudication or worse, wound issues. ?  ?  ? On amiodarone therapy (Chronic)  ?  Continue annual LFTs and TFTs.  Would also add ESR and CRP as markers of inflammatory pulmonary toxicity.  She should be due for annual screening labs with PCP soon. ?  ?  ? ? ?=================================== ? ?HPI:   ? ?Natasha Woodard is a 81 y.o. female with a PMH below who presents today for 67-monthfollow-up.  She is seen in follow-up today at the request of DTonia Ghent MD. ? ?PERTINENT CV Hx ?July 2018 - referred for frequent PVCs & HF Sx --> EF 25-30% => Event Monitor - 17% PVCs.  GDMT Rx initiated & referred to [facility] ?R&LHC - Normal Coronaries. LVEDP 26 mmHg & PCWP 16 mmHg ?Amiodarone added for PVC suppression --> noted progressive improvement in EF -> June 2019 ER 35-40% & Jan 2021 EF 60-65%.   [facility] ?Sx now c/w Class I-II HFpEF - minimal Lasix requirement after addition of Jardiance ?Exercises routinely - but has to stop multiple times for 1 mile walk ?Sept 2022 - LEA Dopplers revealed Severe Occlusive PAD -> Seen by Dr. AFletcher Anon(Med Rx, Walking Rx)  ? ?Natasha BRISBONwas last seen on September 09, 2021 - at this time, was not requiring any additional Lasix beyond 3d/week 20 mg dosing. ABIs reviewed & [facility] when [facility], recommended Conservative Rx - walking & Sx management to avoid likely Bilat Fem-Pop).  ?Converted statin from lovastatin to rosuvastatin 10 mg.  ? ?Last PVCards visit with Dr. AFletcher Anon2/06/2022: Moderate bilateral calf claudication after about a block (consistent  with compression mononeuropathy ABI).  Recommended exercise regimen.  Finds it easier to exercise and machine been on track. ? ?Recent Hospitalizations: none ? ?Phone Calls: ?11/23/2021: Asked about wearing compression stockings during exercise.  (No comment on dyspnea)- OK ?Jan 5-6 -> Viral URI Sx (associated with hallucinations, having "crazy dreams" ?Jan 9. 2023: Indicated she gained 4-5 pounds in the last 3 months.  Noting fatigue and dyspnea.  Usual weight at home is 170 pounds.  Seen by Dr. Recommended 30 minutes of exercise a day. ?Dec 21, 2021 --> told PCP about vivid/crazy dreams.  Husband + COVID (but she was Neg).  Dreams & sleep better with improved URI Sx. (Presumed False Neg COVID). Rec: avoid Claritin just in case.  ?Feb 2 - No edema, orthopnea or wgt gain - noted DOE with stairs & walking to mailbox. = referred to PCP, (She is helping out with care for her sister w/ dementia) ?Seen 01/14/22: Noting exertional dyspnea/intolerance as well as wheezing.  Noted that her URI related dyspnea was not better, but the cough is better.  No fevers. => tried low dose Prednisone Pulse; BNP ordered. ?With BNP ~1300 - she called in with concerns => I ordered Echo &  recommended f/u ?Dr. Damita Dunnings saw her on 3/20: recommended taking 40 mg Lasix on dosing days x 3.  ? ?Seen by Dr. Keturah Barre ([facility]) 01/31/2022 -seen for snoring; had questions about possible need for CPAP

## 2022-03-16 NOTE — Patient Instructions (Signed)
Medi cation Instructions:  ? ? ?Dry Home is  weight  170 lb ? ?  You' re now taking  Lasix ( furosemide) 20 mg  Tuesday,Thursday,Saturday ? ?If you gain weight  overnight 3 lbs or more  take 20 mg lasix ( furosemide) extra even if it isnot your day to take lasix ( furosemide) --take regardless of your standard dose of Furosemide until you go back to 170 lbs. ? ? ?If you are 5 lbs over 170 lbs ( meaning 175 lbs)  then you will  take  40 mg of Lasix ( furosemide ) that day thereafter take 20 mg extra each day until you get back to 170 lbs. ? ? ?*If you need a refill on your cardiac medications before your next appointment, please call your pharmacy* ? ? ?Lab Work: ?Not needed ? ? ? ?Testing/Procedures: ? ?Not needed ? ?Follow-Up: ?At [facility], you and your health needs are our priority.  As part of our continuing mission to provide you with exceptional heart care, we have created designated Provider Care Teams.  These Care Teams include your primary Cardiologist (physician) and Advanced Practice Providers (APPs -  Physician Assistants and Nurse Practitioners) who all work together to provide you with the care you need, when you need it. ? ?  ? ?Your next appointment:   ?5 month(s) ? ?The format for your next appointment:   ?In Person ? ?Provider:   ?Bryan Lemma, MD  ? ? ?

## 2022-03-17 ENCOUNTER — Ambulatory Visit: Payer: Medicare Other

## 2022-03-17 ENCOUNTER — Encounter: Payer: Self-pay | Admitting: Family Medicine

## 2022-03-17 ENCOUNTER — Ambulatory Visit: Payer: Medicare Other | Admitting: Family Medicine

## 2022-03-17 DIAGNOSIS — G4733 Obstructive sleep apnea (adult) (pediatric): Secondary | ICD-10-CM

## 2022-03-17 DIAGNOSIS — R0683 Snoring: Secondary | ICD-10-CM

## 2022-03-17 DIAGNOSIS — I5032 Chronic diastolic (congestive) heart failure: Secondary | ICD-10-CM | POA: Diagnosis not present

## 2022-03-17 DIAGNOSIS — B369 Superficial mycosis, unspecified: Secondary | ICD-10-CM | POA: Diagnosis not present

## 2022-03-17 MED ORDER — NYSTATIN 100000 UNIT/GM EX POWD
1.0000 | Freq: Two times a day (BID) | CUTANEOUS | 1 refills | Status: DC
Start: 2022-03-17 — End: 2022-06-17

## 2022-03-17 MED ORDER — FUROSEMIDE 20 MG PO TABS: 20.0000 mg | ORAL_TABLET | ORAL | Status: DC

## 2022-03-17 NOTE — Patient Instructions (Signed)
Use the chart for your lasix dose.  ? ?Check your AM weight and then pick the dose for that day.  ?Use nystatin powder twice a day in the meantime and let me know if that isn't helping in the next 10 days.  ?Take care.  Glad to see you. ?

## 2022-03-17 NOTE — Progress Notes (Signed)
Vaginal itching x 2-3 months. Patient states she used aveeno lotion on area but did not really help. She has not had any redness or discharge.  Itching is external.  She hasn't used monistat recently.  Same sx under abd wall.   ? ?We talked about her lasix dosing and I made her a chart to use based on the day of the week and her weight.  D/w pt. detailed conversation with with question and answer back-and-forth to clarify her dosing and verify that she could use the chart that I gave her. ? ?Meds, vitals, and allergies reviewed.  ? ?ROS: Per HPI unless specifically indicated in ROS section  ? ?Nad ?Superficial fungal infection under abdominal pannus.  No ulceration. ?Vaginal exam deferred.   ?Chronic skin changes at baseline.   ? ?30 minutes were devoted to patient care in this encounter (this includes time spent reviewing the patient's file/history, interviewing and examining the patient, counseling/reviewing plan with patient).  ? ?

## 2022-03-20 DIAGNOSIS — B369 Superficial mycosis, unspecified: Secondary | ICD-10-CM | POA: Insufficient documentation

## 2022-03-20 NOTE — Assessment & Plan Note (Signed)
Discussed how to dose her Lasix based on the day of the week and her weight.  Chart given.  She understood.  She can update me as needed. ?

## 2022-03-20 NOTE — Assessment & Plan Note (Signed)
Advised to use nystatin powder twice a day in the meantime and let me know if that isn't helping in the next 10 days.  ?

## 2022-03-21 ENCOUNTER — Ambulatory Visit: Payer: Medicare Other | Admitting: Family Medicine

## 2022-03-22 NOTE — Assessment & Plan Note (Signed)
Continue annual LFTs and TFTs.  Would also add ESR and CRP as markers of inflammatory pulmonary toxicity.  She should be due for annual screening labs with PCP soon. ?

## 2022-03-22 NOTE — Assessment & Plan Note (Addendum)
She has had a little bit of a setback with acute exacerbation managed expertly by her PCP with additional doses of diuretic.  She now seems to be back down to her stable dry weight.  She feels pretty good today and her weight at home was 170 pounds.  I think this is a good easy starting place for "dry weight". ? ?? She is otherwise on a pretty stable HFpEF regimen with carvedilol 25 mg twice daily, Entresto 97-103 mg twice daily, Jardiance 10 mg daily ? ?? She is currently taking furosemide 20 mg 3 days a week -> and has had a recent exacerbation. ?? Because her potassium level has been somewhat elevated, she is no longer on spironolactone.  (If she needs more Lasix, then maybe we can actually restart low-dose spironolactone) ? ?I again reiterated the same information that her PCP only discussed, and that we have is discussed every single time I see her.  We discussed sliding scale dosing of Lasix based on daily  ?Again we wrote out all instructions.=> Strenuously reiterated the importance of keeping the AVS out with the discharge instructions posted on her refrigerator to avoid further confusion. ? ?Dry Home is  weight  170 lb ? ?  You' re now taking  Lasix ( furosemide) 20 mg  Tuesday,Thursday,Saturday ? ?If you gain weight  overnight 3 lbs or more  take 20 mg lasix ( furosemide) extra even if it isnot your day to take lasix ( furosemide) --take regardless of your standard dose of Furosemide until you go back to 170 lbs. ? ? ?If you are 5 lbs over 170 lbs ( meaning 175 lbs)  then you will  take  40 mg of Lasix ( furosemide ) that day thereafter take 20 mg extra each day until you get back to 170 lbs. ? ?

## 2022-03-22 NOTE — Assessment & Plan Note (Signed)
On amiodarone for PVC suppression which led to the near resolution of her cardiomyopathy.  On amiodarone at maintenance 100 mg daily along with carvedilol 25 mg twice daily.  Interestingly, she was very asymptomatic of the PVCs, just noted CHF ?

## 2022-03-22 NOTE — Assessment & Plan Note (Signed)
BP actually looks pretty good today.  Borderline high but without having to add another medication, would simply allow mild permissive pressures. ? ?If she requires more than 3 days a week furosemide, would need may want to add low-dose spironolactone which will also help with blood pressure and diuresis.  (Would need to closely follow-up with testing) ?

## 2022-03-22 NOTE — Assessment & Plan Note (Signed)
Plan per Dr. Kirke Corin -- conservative management -> encourage continued walking (even if it means frequent stops).  -- Also, could consider the use of stair climber or elliptical trainers which would help some with collateralization's, but may be more tolerable with her OA pains.  I also did recommend that she continue to do the walking because it helps out with her bone density. ? ?She has bilateral SFA occlusion on Dopplers.  She probably would not be a good candidate for bilateral femoropopliteal bypass unless she has limiting claudication or worse, wound issues. ?

## 2022-03-22 NOTE — Assessment & Plan Note (Addendum)
The she should be due for follow-up labs to be checked soon.  Last lab was in April 2022 to showing LDL 81 which is pretty much at goal for her.  She is on low-dose rosuvastatin and tolerating well. ? ?She has had some PAD diagnosis and therefore would probably target LDL less than 70. ?Await lab results. ? ?Metformin and Jardiance.  Most recent A1c was 6.4.  Relatively well controlled. ?

## 2022-03-22 NOTE — Assessment & Plan Note (Signed)
Thought to be related to frequent PVCs.  She had class III CHF symptoms associated with this reduced EF with significant volume overload. ? ?She was started on amiodarone for PVC suppression, and is now on 100 mg daily amiodarone along with guideline directed medical therapy with carvedilol, Entresto at max doses along with Jardiance at appropriate dose.  Her EF is now back in the normal range with no RWMA.  She has a nonischemic evaluation with no active angina. ? ?Spironolactone was discontinued because of hyperkalemia, and she has not required a lot of diuretic since initiation of Jardiance with exception of in the last month or 2 where she had a slight exacerbation. ? ?Her major concerning symptoms that she has when she has an exacerbation of heart failure is edema with some orthopnea.  Natasha Woodard has a very low level of understanding about her condition despite extensive amount of time spent with her. ?

## 2022-03-30 DIAGNOSIS — G4733 Obstructive sleep apnea (adult) (pediatric): Secondary | ICD-10-CM | POA: Diagnosis not present

## 2022-04-05 ENCOUNTER — Telehealth: Payer: Self-pay | Admitting: Internal Medicine

## 2022-04-05 DIAGNOSIS — G4733 Obstructive sleep apnea (adult) (pediatric): Secondary | ICD-10-CM

## 2022-04-05 NOTE — Telephone Encounter (Signed)
Her home sleep test showed moderate obstructive sleep apnea, averaging 25 apneas/ hour with low oxygen levels. ? ?I recommend we order new DME, new CPAP auto 5-15, mask of choice, humidifier, supplies, AirView/ card ? ?She will need on office f/u in 31-90 days after getting CPAP per insurance regs. ? ?(As a reminder, if she is concerned- We had asked her to call us about 2 weeks after the test for results. This was discussed at our visit and written on her AVS.) ?

## 2022-04-05 NOTE — Telephone Encounter (Signed)
Called and went over results with patient. She voiced understanding. Nothing further needed. Order placed.  ?

## 2022-04-05 NOTE — Telephone Encounter (Signed)
ATC patient.  LMTCB. 

## 2022-04-05 NOTE — Telephone Encounter (Signed)
Patient returned call and requested a mychart message to be sent to her with results from Dr. Maple Hudson and also requested a printed copy of home sleep study to be mailed to address on file. Nothing further needed.  ?

## 2022-04-05 NOTE — Telephone Encounter (Signed)
ATC patient to let her know I would ask Dr. Maple Hudson about results for her. Line rang for approx. 2 minutes with no answer and no vm option.  ? ?Dr. Maple Hudson please advise, do you have an interpretation of HST results available for patient? Thanks!  ?

## 2022-04-08 ENCOUNTER — Telehealth: Payer: Self-pay | Admitting: Internal Medicine

## 2022-04-08 NOTE — Telephone Encounter (Signed)
Patient is returning phone call. Patient phone number is [phone]. ?

## 2022-04-08 NOTE — Telephone Encounter (Signed)
Called the pt and there was no answer- LMTCB    

## 2022-04-11 NOTE — Telephone Encounter (Signed)
Waymon Budge, MD ?  ?   2:29 PM ?Note ?Her home sleep test showed moderate obstructive sleep apnea, averaging 25 apneas/ hour with low oxygen levels. ?  ?I recommend we order new DME, new CPAP auto 5-15, mask of choice, humidifier, supplies, AirView/ card ?  ?She will need on office f/u in 31-90 days after getting CPAP per insurance regs. ?  ?(As a reminder, if she is concerned- We had asked her to call us about 2 weeks after the test for results. This was discussed at our visit and written on her AVS.)  ?  ? ?Order for CPAP was placed on 04/05/2022 by Meagan when they went over the results. Called and left patient a message letting her know if she is needing anything further to give [place] a call back otherwise we would see her after CPAP set up. Nothing further needed at this time. ?

## 2022-04-14 ENCOUNTER — Telehealth: Payer: Self-pay | Admitting: Internal Medicine

## 2022-04-14 NOTE — Telephone Encounter (Signed)
ATC patient.  LMTCB. 

## 2022-04-18 NOTE — Telephone Encounter (Signed)
Spoke with the pt and answered her questions regarding CPAP order-it came from [facility]. She says that they have already called her and she will call them back to get set up. Nothing further needed.  ?

## 2022-04-18 NOTE — Telephone Encounter (Signed)
Patient is calling back for results, needs them explained. Patient states she has been getting calls from [facility], [facility] and other places and wants to know where to get CPAP from. ? ?Please advise- call back number [phone]. ?

## 2022-04-23 IMAGING — DX DG CHEST 2V
2 series · 2 of 2 positions shown · non-contrast
Comparison: 04/27/2021

CLINICAL DATA: Dyspnea

EXAM:
CHEST - 2 VIEW

[chest pa]
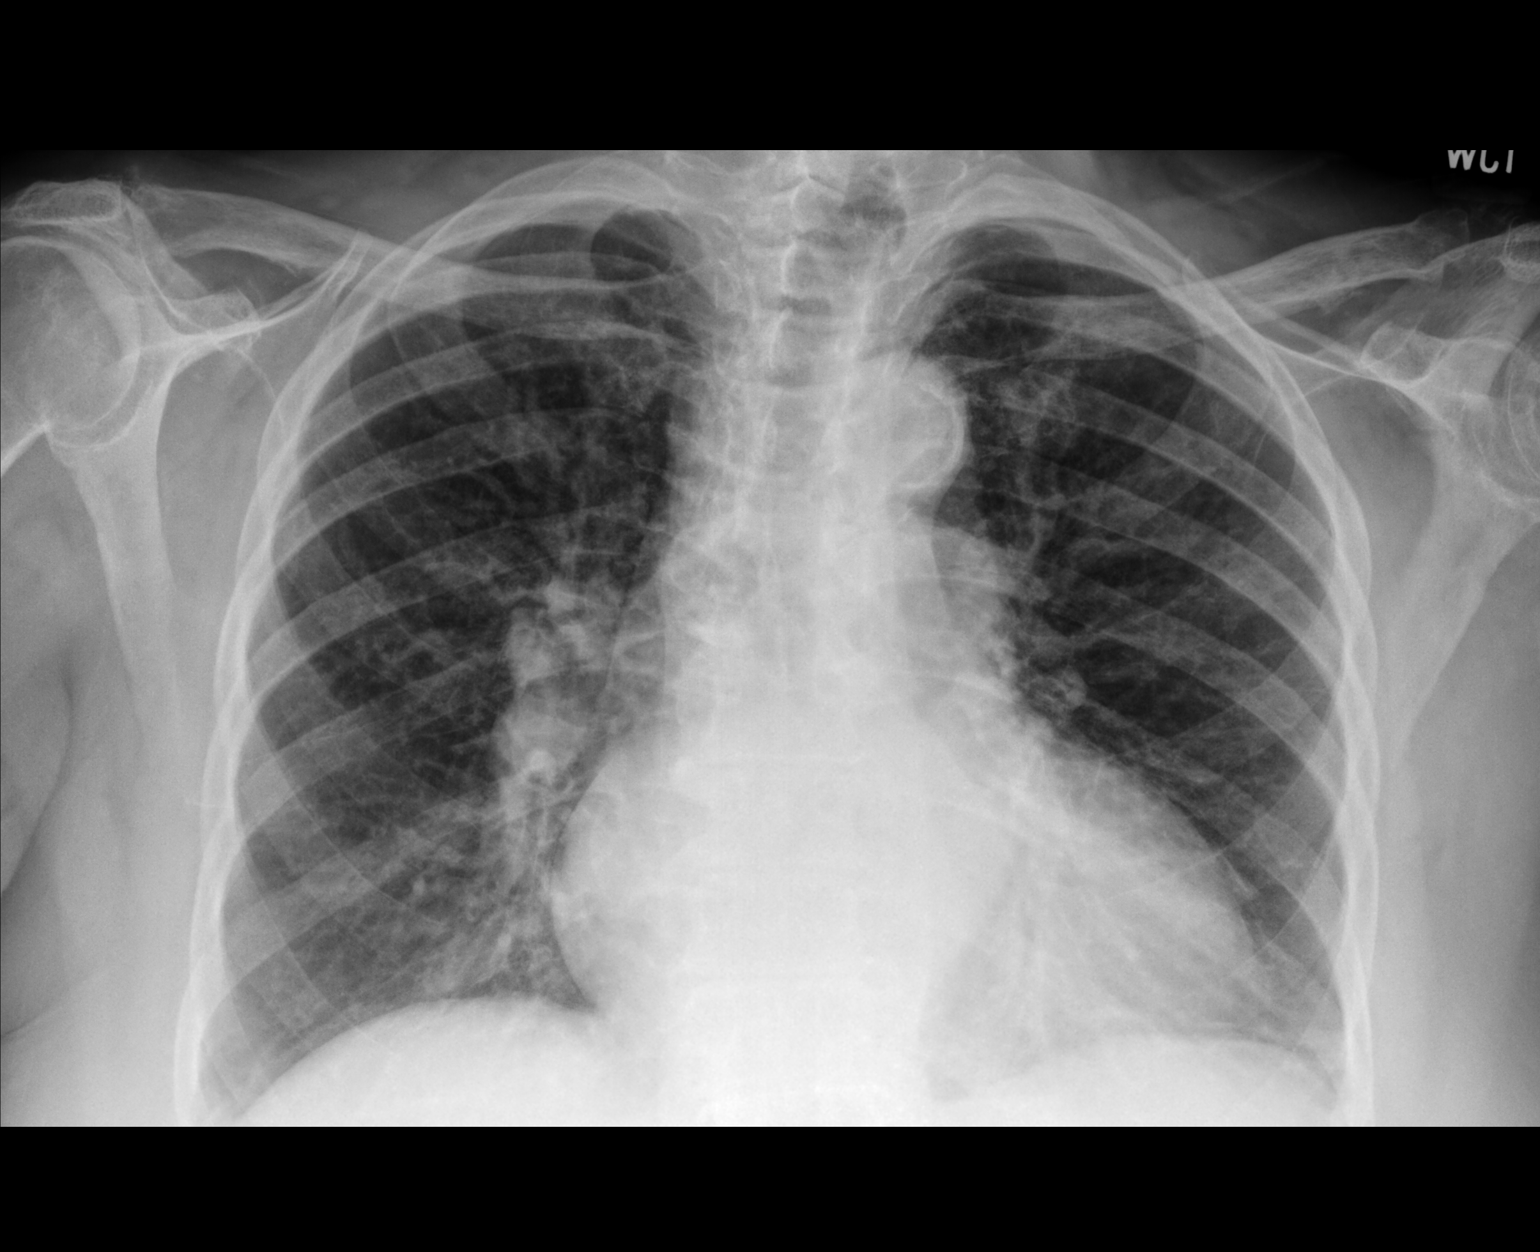

[chest lat]
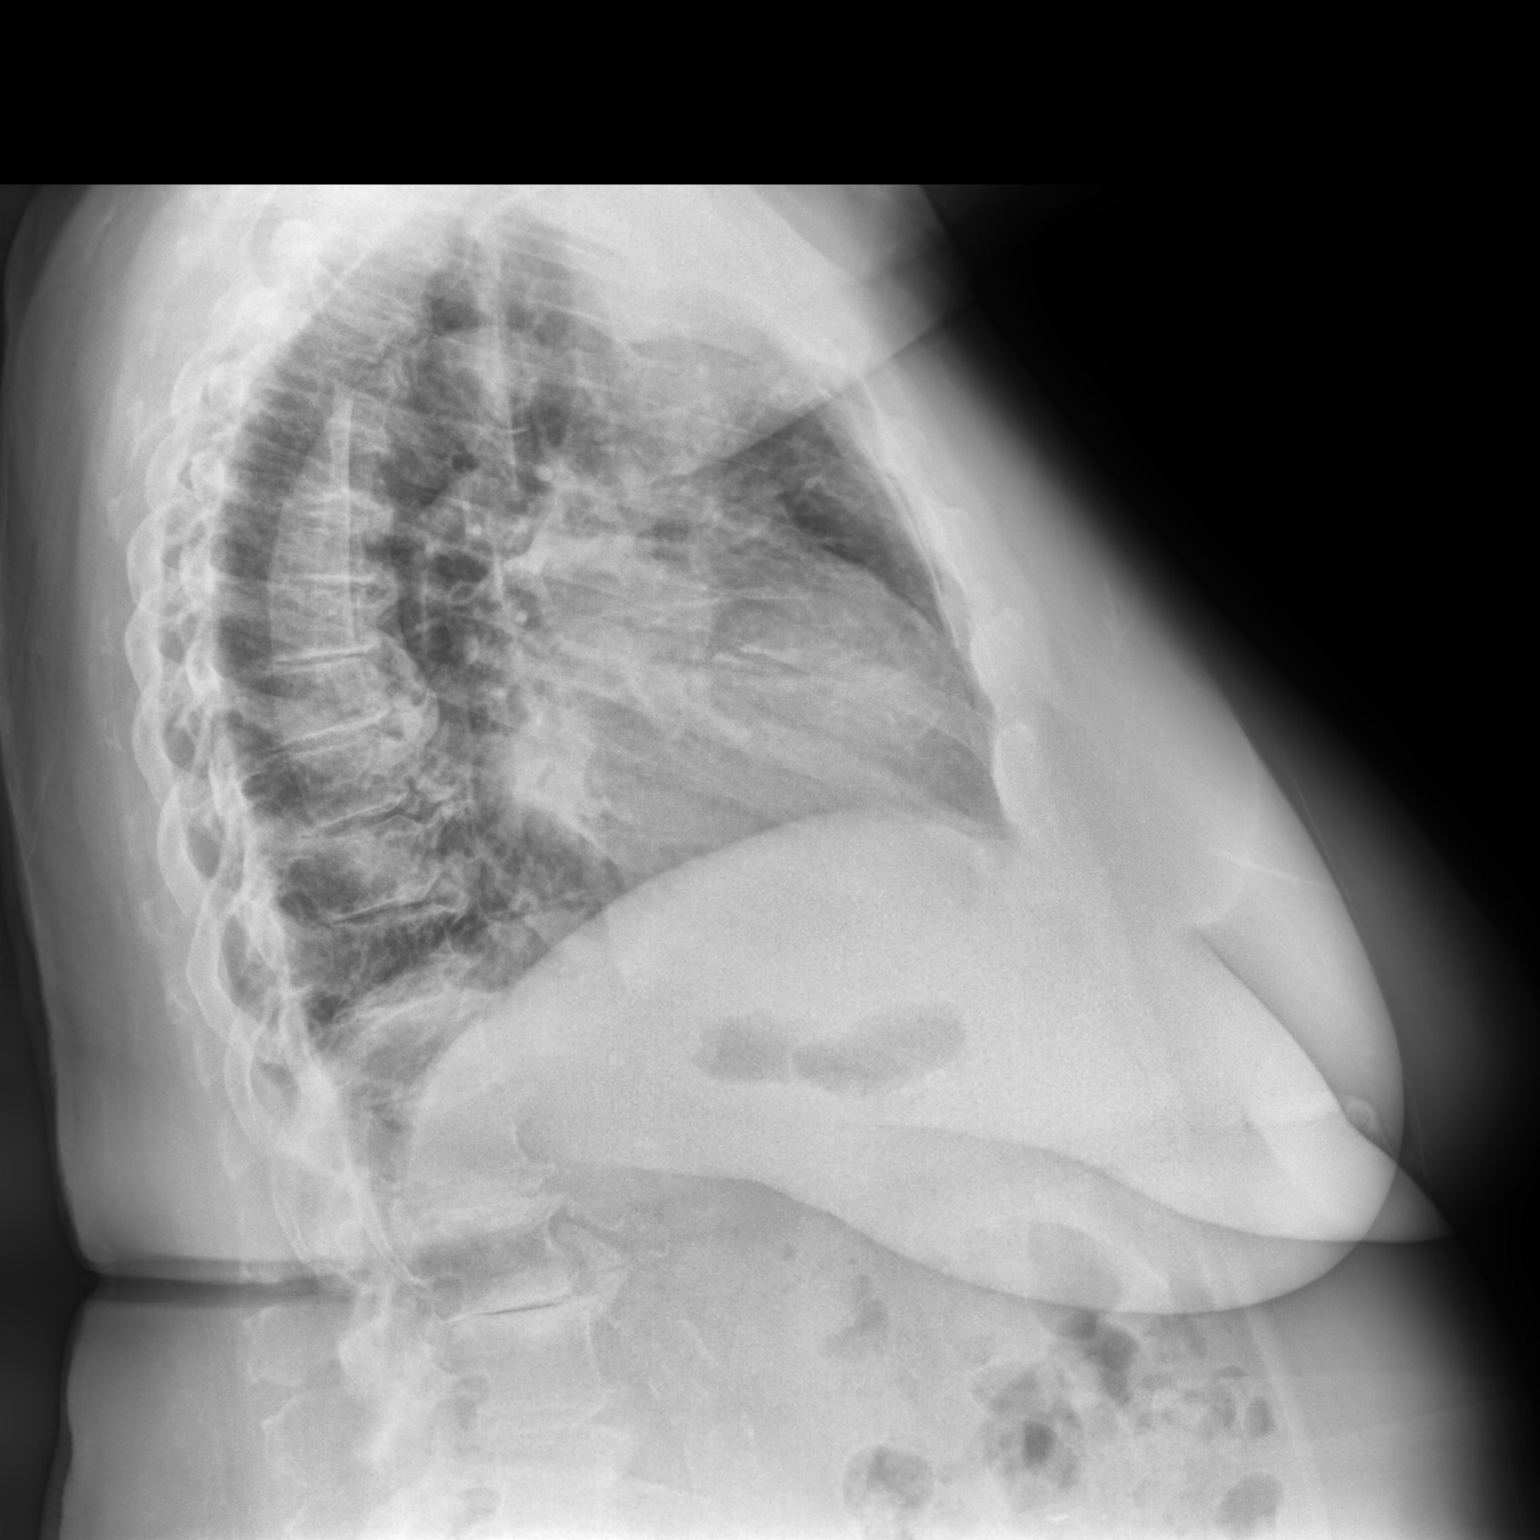

[2 of 2 positions shown; findings below may reference images not displayed]

FINDINGS: Mild bilateral interstitial thickening. No focal consolidation. No
pleural effusion or pneumothorax. Mild cardiomegaly.

No acute osseous abnormality.
IMPRESSION: 1. Cardiomegaly with mild pulmonary vascular congestion.

## 2022-04-25 DIAGNOSIS — G4733 Obstructive sleep apnea (adult) (pediatric): Secondary | ICD-10-CM | POA: Diagnosis not present

## 2022-04-26 ENCOUNTER — Telehealth: Payer: Self-pay

## 2022-04-26 DIAGNOSIS — M25511 Pain in right shoulder: Secondary | ICD-10-CM | POA: Diagnosis not present

## 2022-04-26 DIAGNOSIS — M25512 Pain in left shoulder: Secondary | ICD-10-CM | POA: Diagnosis not present

## 2022-04-26 NOTE — Telephone Encounter (Signed)
I called patient for telephonic AWV. She sounded in a rush and kept telling me that she needed to call me back. She hung up and I was unable to give her a number to call back. ?

## 2022-05-02 ENCOUNTER — Telehealth: Payer: Self-pay

## 2022-05-02 NOTE — Progress Notes (Signed)
    Chronic Care Management Pharmacy Assistant   Name: NEKODA LEHANE  MRN: [id number] DOB: 04-24-1941  Reason for Encounter: CCM (Appointment Reminder)  Medications: Outpatient Encounter Medications as of 05/02/2022  Medication Sig   acetaminophen (TYLENOL) 500 MG tablet Take 500 mg by mouth every 6 (six) hours as needed.   amiodarone (PACERONE) 200 MG tablet TAKE 1/2 TABLET BY MOUTH EVERY DAY   aspirin 81 MG tablet Take 1 tablet (81 mg total) by mouth daily.   carvedilol (COREG) 25 MG tablet TAKE 1 TABLET BY MOUTH TWICE DAILY   Cholecalciferol (VITAMIN D) 1000 UNITS capsule Take 1,000 Units by mouth daily.   fluticasone (FLONASE) 50 MCG/ACT nasal spray Place 2 sprays into both nostrils daily. (Patient taking differently: Place 2 sprays into both nostrils as needed.)   furosemide (LASIX) 20 MG tablet Take 1-2 tablets (20-40 mg total) by mouth 3 (three) times a week. Take 20 mg if morning weight is below 170 pounds.  Take 40 mg if morning weight is 175 pounds or higher.   glucose blood (ACCU-CHEK AVIVA PLUS) test strip USE TO TEST BLOOD SUGAR ONCE DAILY FOR DM E11.9   JARDIANCE 10 MG TABS tablet TAKE 1/2 TABLET(5 MG) BY MOUTH DAILY   Lancets (ACCU-CHEK MULTICLIX) lancets USE ONE LANCET TO TEST BLOOD GLUCOSE DAILY FOR DM 250.00   metFORMIN (GLUCOPHAGE) 500 MG tablet TAKE 1/2 TABLET BY MOUTH DAILY WITH A MEAL   nystatin (MYCOSTATIN/NYSTOP) powder Apply 1 application. topically 2 (two) times daily.   rosuvastatin (CRESTOR) 10 MG tablet Take 1 tablet (10 mg total) by mouth daily.   sacubitril-valsartan (ENTRESTO) 97-103 MG Take 1 tablet by mouth 2 (two) times daily.   traMADol (ULTRAM) 50 MG tablet Take 1 tablet (50 mg total) by mouth 3 (three) times daily as needed. (Patient taking differently: Take 50 mg by mouth daily. 2 tablets daily. M W F patient takes three tablets.)   No facility-administered encounter medications on file as of 05/02/2022.   Garnette Czech was contacted to remind  of upcoming telephone visit with Charlene Brooke on 05/04/2022 at 3:45. Patient was reminded to have any blood glucose and blood pressure readings available for review at appointment.   Patient confirmed appointment.  Are you having any problems with your medications? No   Do you have any concerns you like to discuss with the pharmacist? No  CCM referral has been placed prior to visit?  No   Star Rating Drugs: Medication:    Last Fill: Day Supply Sacubitril-Valsartan 97-103 mg  Jardiance 10 mg   03/14/2022 90 Metformin 500 mg   03/14/2022 90 Rosuvastatin 10 mg   04/11/2022 [place], CPP notified  Marijean Niemann, [place] Clinical Pharmacy Assistant [phone]

## 2022-05-04 ENCOUNTER — Ambulatory Visit: Payer: Medicare Other | Admitting: Pharmacist

## 2022-05-04 DIAGNOSIS — I5032 Chronic diastolic (congestive) heart failure: Secondary | ICD-10-CM

## 2022-05-04 DIAGNOSIS — I70213 Atherosclerosis of native arteries of extremities with intermittent claudication, bilateral legs: Secondary | ICD-10-CM

## 2022-05-04 DIAGNOSIS — E782 Mixed hyperlipidemia: Secondary | ICD-10-CM

## 2022-05-04 DIAGNOSIS — E1151 Type 2 diabetes mellitus with diabetic peripheral angiopathy without gangrene: Secondary | ICD-10-CM

## 2022-05-04 DIAGNOSIS — I10 Essential (primary) hypertension: Secondary | ICD-10-CM

## 2022-05-04 NOTE — Progress Notes (Signed)
Chronic Care Management Pharmacy Note  05/11/2022 Name:  Natasha Woodard MRN:  [id number] DOB:  [birth year]  Summary: CCM F/U visit -Reviewed medications; patient affirms compliance as prescribed -BP at home much improved, pt reports SBP 120s -Swelling/SOB improved after course of higher-dose furosemide, she has now resumed 3x weekly furosemide 20 mg; she verbalizes understanding of when to take extra dose of furosemide based on weight -Cost barriers reviewed: she has been approved for Jardiance and Entresto assistance  Recommendations/Changes made from today's visit: -No med changes  Plan: -[place] will call 3 months for HF update -Pharmacist follow up televisit scheduled for 6 months -AWV (LPN) 82/4/69    Subjective: Natasha Woodard is an 81 y.o. year old female who is a primary patient of Damita Dunnings, Elveria Rising, MD.  The CCM team was consulted for assistance with disease management and care coordination needs.    Engaged with patient by telephone for follow up visit in response to provider referral for pharmacy case management and/or care coordination services.   Consent to Services:  The patient was given information about Chronic Care Management services, agreed to services, and gave verbal consent prior to initiation of services.  Please see initial visit note for detailed documentation.   Patient Care Team: Tonia Ghent, MD as PCP - General (Family Medicine) Leonie Man, MD as PCP - Cardiology (Cardiology) Bensimhon, Shaune Pascal, MD as PCP - Advanced Heart Failure (Cardiology) Wellington Hampshire, MD as PCP - [facility] Cardiology (Cardiology) Hortencia Pilar, MD as Consulting Physician (Ophthalmology) Charlton Haws, [facility] as Pharmacist (Pharmacist)   Recent office visits: 03/17/22 Dr Damita Dunnings OV: fungal skin infx - rx'd nystatin cream. 01/14/22 Dr Damita Dunnings OV: c/o wheezing, covid exposure (testing negative but husband testing positive); Rx prednisone 10 mg x  5 days. BNP elevated, increase furosemide 40 mg x 3 days, then alternate 40 mg/20 mg for 1 week  08/19/2021 - Elsie Stain, MD - Patient presented for follow up Diabetes. Labs: A1c. Change: metFORMIN (GLUCOPHAGE) 500 MG tablet. Take half tablet by mouth daily with meal (previously twice a day).   04/29/2021 -  Elsie Stain, MD - Patient presented for cough. Start: benzonatate (TESSALON) 200 MG capsule.  04/06/2021 - Elsie Stain, MD - [place] presented for Annual Wellness Visit. Stop: omeprazole (PRILOSEC) 20 MG capsule as no longer needed. Labs: CXR and EKG. Start: OTC Zyrtec.   Recent consult visits: 04/05/22 Pulmonary TE - ordered CPAP to [facility]. 03/16/22 Dr Ellyn Hack (cardiology): f/u HF. No med changes. 01/31/22 Dr Annamaria Boots (pulmonary) f/u OSA, HF. Schedule home sleep study.  01/18/22 Dr Fletcher Anon (cardiology): f/u PAD, HF. No med changes.  09/14/2021 - Kathlyn Sacramento, MD - Cardiology - eval PAD. No medication changes. Emphasized exercise.  09/09/2021 - Glenetta Hew, MD - Cardiology - Patient presented for follow up on Hyperlipidemia. Labs. EKG. Referral for Peripheral Vascular Consult. suspect now that we have identified what appears to be occlusive PAD, would probably want to convert from lovastatin to likely rosuvastatin.  We can address this in follow-up.  09/09/2021 - [facility] - Patient presented for [facility]. No other information.  04/27/2021 - [facility] - Patient presented for with cough, chest congestion.  03/31/2021 - Glori Bickers - Cardiology - Patient presented for congestive heart failure. No medication changes.   Hospital visits: None in previous 6 months   Objective:  Lab Results  Component Value Date   CREATININE 1.28 (H) 01/14/2022   BUN 17  01/14/2022   GFR 45.93 (L) 03/19/2021   GFRNONAA 40 (L) 10/10/2020   GFRAA 49 (L) 10/09/2020   NA 144 01/14/2022   K 4.9 01/14/2022   CALCIUM 9.6 01/14/2022   CO2 30 01/14/2022   GLUCOSE 89  01/14/2022   Lab Results  Component Value Date/Time   HGBA1C 6.4 (H) 01/14/2022 03:51 PM   HGBA1C 6.2 (A) 08/19/2021 11:19 AM   HGBA1C 6.6 (H) 03/19/2021 11:21 AM   GFR 45.93 (L) 03/19/2021 11:21 AM   GFR 52.41 (L) 11/12/2019 09:21 AM    Last diabetic Eye exam:  Lab Results  Component Value Date/Time   HMDIABEYEEXA No Retinopathy 10/06/2020 12:00 AM   HMDIABEYEEXA No Retinopathy 10/06/2020 12:00 AM    Last diabetic Foot exam:  Lab Results  Component Value Date/Time   HMDIABFOOTEX yes 01/25/2011 12:00 AM     Lab Results  Component Value Date   CHOL 141 03/19/2021   HDL 42.10 03/19/2021   LDLCALC 81 03/19/2021   LDLDIRECT 53 08/10/2009   TRIG 89.0 03/19/2021   CHOLHDL 3 03/19/2021       Latest Ref Rng & Units 01/14/2022    3:51 PM 03/19/2021   11:21 AM 10/10/2020    1:43 AM  Hepatic Function  Total Protein 6.1 - 8.1 g/dL 6.6   6.5   6.1    Albumin 3.5 - 5.2 g/dL  3.9   3.5    AST 10 - 35 U/L 17   12   19     ALT 6 - 29 U/L 18   9   6     Alk Phosphatase 39 - 117 U/L  56   58    Total Bilirubin 0.2 - 1.2 mg/dL 0.7   0.5   0.7      Lab Results  Component Value Date/Time   TSH 1.23 03/19/2021 11:21 AM   TSH 2.148 04/17/2020 03:08 PM   TSH 1.87 11/12/2019 09:21 AM   FREET4 1.32 (H) 04/17/2020 03:08 PM   FREET4 1.28 10/08/2018 11:14 AM       Latest Ref Rng & Units 01/14/2022    3:51 PM 03/19/2021   11:21 AM 10/09/2020    9:35 AM  CBC  WBC 3.8 - 10.8 Thousand/uL 4.3   4.3   3.9    Hemoglobin 11.7 - 15.5 g/dL 13.5   13.6   14.2    Hematocrit 35.0 - 45.0 % 39.9   41.8   44.9    Platelets 140 - 400 Thousand/uL 287   333.0   379      Lab Results  Component Value Date/Time   VD25OH 60 07/23/2013 10:13 AM    Clinical ASCVD: Yes  - PAD The ASCVD Risk score (Arnett DK, et al., 2019) failed to calculate for the following reasons:   The 2019 ASCVD risk score is only valid for ages 48 to 81    Lab Results  Component Value Date/Time   BNP 1,333 (H) 01/14/2022 03:51 PM    BNP 263.3 (H) 10/10/2019 12:55 PM   BNP 338.7 (H) 10/08/2018 11:15 AM      04/08/2021    8:21 AM 11/12/2019    3:07 PM 11/15/2018    2:49 PM  Depression screen PHQ 2/9  Decreased Interest 0 0 0  Down, Depressed, Hopeless 0 0 0  PHQ - 2 Score 0 0 0  Altered sleeping 0 0   Tired, decreased energy 0 0   Change in appetite 0 0   Feeling  bad or failure about yourself  0 0   Trouble concentrating 0 0   Moving slowly or fidgety/restless 0 0   Suicidal thoughts 0 0   PHQ-9 Score 0 0   Difficult doing work/chores Not difficult at all Not difficult at all      Social History   Tobacco Use  Smoking Status Former   Years: 28.00   Types: Cigarettes   Quit date: 12/12/1993   Years since quitting: 28.4  Smokeless Tobacco Never   BP Readings from Last 3 Encounters:  03/17/22 120/78  03/16/22 134/84  02/28/22 124/80   Pulse Readings from Last 3 Encounters:  03/17/22 70  03/16/22 70  02/28/22 73   Wt Readings from Last 3 Encounters:  03/17/22 172 lb (78 kg)  03/16/22 173 lb 6.4 oz (78.7 kg)  02/28/22 174 lb (78.9 kg)   BMI Readings from Last 3 Encounters:  03/17/22 31.46 kg/m  03/16/22 31.72 kg/m  02/28/22 31.83 kg/m    Assessment/Interventions: Review of patient past medical history, allergies, medications, health status, including review of consultants reports, laboratory and other test data, was performed as part of comprehensive evaluation and provision of chronic care management services.   SDOH:  (Social Determinants of Health) assessments and interventions performed: Yes SDOH Interventions    Flowsheet Row Most Recent Value  SDOH Interventions   Food Insecurity Interventions Intervention Not Indicated  Financial Strain Interventions Other (Comment)  [assistance approved for Jardiance, Entresto]      SDOH Screenings   Alcohol Screen: Not on file  Depression (PHQ2-9): Not on file  Financial Resource Strain: Low Risk    Difficulty of Paying Living Expenses:  Not very hard  Food Insecurity: No Food Insecurity   Worried About Running Out of Food in the Last Year: Never true   Ran Out of Food in the Last Year: Never true  Housing: Not on file  Physical Activity: Not on file  Social Connections: Not on file  Stress: Not on file  Tobacco Use: Medium Risk   Smoking Tobacco Use: Former   Smokeless Tobacco Use: Never   Passive Exposure: Not on file  Transportation Needs: Not on file    [place]  Allergies  Allergen Reactions   Ezetimibe-Simvastatin Other (See Comments)    REACTION: Muscle aches (side effect)   Lipitor [Atorvastatin] Other (See Comments)    Leg weakness    Claritin [Loratadine]     Possible cause of nightmares.     Tamiflu [Oseltamivir Phosphate] Other (See Comments)    nightmares   Pravastatin Sodium Other (See Comments)    REACTION: Muscle aches (side effect)   Sulfonamide Derivatives Nausea And Vomiting    Medications Reviewed Today     Reviewed by Filbert Berthold, CMA (Certified Medical Assistant) on 03/17/22 at [phone]  Med List Status: <None>   Medication Order Taking? Sig Documenting Provider Last Dose Status Informant  acetaminophen (TYLENOL) 500 MG tablet [id number] Yes Take 500 mg by mouth every 6 (six) hours as needed. [provider] Taking Active   amiodarone (PACERONE) 200 MG tablet [id number] Yes TAKE 1/2 TABLET BY MOUTH EVERY DAY Leonie Man, MD Taking Active   aspirin 81 MG tablet [id number] Yes Take 1 tablet (81 mg total) by mouth daily. Tonia Ghent, MD Taking Active   carvedilol (COREG) 25 MG tablet [id number] Yes TAKE 1 TABLET BY MOUTH TWICE DAILY Tonia Ghent, MD Taking Active   Cholecalciferol (VITAMIN D) 1000 UNITS capsule [id number] Yes  Take 1,000 Units by mouth daily. [provider] Taking Active Self           Med Note Fleet Contras Sep 14, 2017  6:33 AM)    fluticasone (FLONASE) 50 MCG/ACT nasal spray [id number] Yes Place 2 sprays into both nostrils  daily.  Patient taking differently: Place 2 sprays into both nostrils as needed.   Tonia Ghent, MD Taking Active   furosemide (LASIX) 20 MG tablet [id number] Yes Take 1-2 tablets (20-40 mg total) by mouth 3 (three) times a week. Take 20 mg if morning weight is below 170 pounds.  Take 40 mg if morning weight is 170 pounds or higher. Tonia Ghent, MD Taking Active   glucose blood (ACCU-CHEK AVIVA PLUS) test strip [id number] Yes USE TO TEST BLOOD SUGAR ONCE DAILY FOR DM E11.9 Tonia Ghent, MD Taking Active   JARDIANCE 10 MG TABS tablet [id number] Yes TAKE 1/2 TABLET(5 MG) BY MOUTH DAILY Leonie Man, MD Taking Active   Lancets (ACCU-CHEK MULTICLIX) lancets [id number] Yes USE ONE LANCET TO TEST BLOOD GLUCOSE DAILY FOR DM 250.00 Tonia Ghent, MD Taking Active   metFORMIN (GLUCOPHAGE) 500 MG tablet [id number] Yes TAKE 1/2 TABLET BY MOUTH DAILY WITH A MEAL Tonia Ghent, MD Taking Active   rosuvastatin (CRESTOR) 10 MG tablet [id number] Yes Take 1 tablet (10 mg total) by mouth daily. Tonia Ghent, MD Taking Active   sacubitril-valsartan [facility]) 97-103 MG [id number] Yes Take 1 tablet by mouth 2 (two) times daily. Leonie Man, MD Taking Active   traMADol Veatrice Bourbon) 50 MG tablet [id number] Yes Take 1 tablet (50 mg total) by mouth 3 (three) times daily as needed.  Patient taking differently: Take 50 mg by mouth daily. 2 tablets daily. M W F patient takes three tablets.   Tonia Ghent, MD Taking Active             Patient Active Problem List   Diagnosis Date Noted   Superficial fungal infection of skin 03/20/2022   ETD (eustachian tube dysfunction) 03/02/2022   Nightmare 12/22/2021   Viral URI with cough 12/14/2021   Onychomycosis of multiple toenails with type 2 diabetes mellitus and peripheral angiopathy ([place]) 11/16/2021   Pain due to onychomycosis of toenail of left foot 11/16/2021   Intermittent claudication of both lower extremities due to atherosclerosis ([place])  08/22/2021   Ganglion cyst of wrist 08/22/2021   Rash 02/17/2021   On amiodarone therapy 08/21/2020   Hyperkalemia 08/21/2020   CKD (chronic kidney disease) stage 3, GFR 30-59 ml/min (HCC) 09/25/2019   Gastroesophageal reflux disease 06/19/2019   Abdominal pain 02/06/2019   Health care maintenance 11/14/2018   Joint pain 08/07/2018   Cough 06/17/2018   Sensorineural hearing loss (SNHL), bilateral 03/20/2018   Complex sleep apnea syndrome 10/05/2017   Chronic heart failure with preserved ejection fraction (HFpEF) ([place]) 09/08/2017   Dilated cardiomyopathy ([place]) 06/30/2017   Frequent PVCs 06/30/2017   Advance care planning 08/19/2014   Medicare annual wellness visit, subsequent 07/30/2013   KNEE PAIN, LEFT 11/09/2010   Type II diabetes mellitus with peripheral artery disease ([place]) 05/19/2010   Hyperlipidemia associated with type 2 diabetes mellitus ([place]) 05/19/2010   Essential hypertension 05/19/2010   EMPHYSEMA 05/19/2010   HIATAL HERNIA WITH REFLUX 05/19/2010   Ichthyosis 05/19/2010    Immunization History  Administered Date(s) Administered   Fluad Quad(high Dose 65+) 08/19/2021   Influenza Whole 08/12/2010   Influenza,inj,Quad PF,6+ Mos 08/19/2014, 08/15/2015, 09/02/2016,  08/05/2019   Influenza-Unspecified 09/11/2018   PFIZER Comirnaty(Gray Top)Covid-19 Tri-Sucrose Vaccine 03/23/2021   PFIZER(Purple Top)SARS-COV-2 Vaccination 02/09/2020, 02/29/2020, 09/15/2020   Pneumococcal Conjugate-13 03/10/2015   Pneumococcal Polysaccharide-23 05/26/2006   Td 06/14/2007   Zoster Recombinat (Shingrix) 08/12/2020, 10/12/2020   Zoster, Live 08/10/2006    Conditions to be addressed/monitored:  Hypertension, Hyperlipidemia, Diabetes, Heart Failure, and Chronic Kidney Disease, PAD  Care Plan : [place]  Updates made by Charlton Haws, [place] since 05/11/2022 12:00 AM     Problem: Hypertension, Hyperlipidemia, Diabetes, Heart Failure, and Chronic Kidney Disease, PAD    Priority: High     Long-Range Goal: Disease management   Start Date: 09/29/2021  Expected End Date: 09/29/2022  This Visit's Progress: On track  Recent Progress: On track  Priority: High  Note:   Current Barriers:  None identified  Pharmacist Clinical Goal(s):  Patient will contact provider office for questions/concerns as evidenced notation of same in electronic health record through collaboration with PharmD and provider.   Interventions: 1:1 collaboration with Tonia Ghent, MD regarding development and update of comprehensive plan of care as evidenced by provider attestation and co-signature Inter-disciplinary care team collaboration (see longitudinal plan of care) Comprehensive medication review performed; medication list updated in electronic medical record  Hyperlipidemia / PAD (LDL goal < 70) -Query controlled - LDL 81 (03/2021) above goal; pt endorses compliance with new rosuvastatin (changed 09/2021 from lovastatin due to leg cramps/LDL not at goal); repeat lipids have not been checked since switch -New occlusive PAD Sept 2022; considered Xarelto 2.5 mg -Current treatment: Rosuvastatin 10 mg daily - Appropriate, Query Effective Aspirin 81 mg daily - Appropriate, Effective, Safe, Accessible -Previously tried/failed meds: lovastatin (leg cramps) -[facility] -Educated on Cholesterol goals; Benefits of statin for ASCVD risk reduction; Strategies to manage statin-induced myalgias; benefits of aspirin in PAD -Counseled to continued daily exercise routine -Recommend to continue current medication; repeat lipid panel at next OV  Diabetes (A1c goal <7%) -Controlled - A1c is at goal; pt normally checked BG once a week but has checked daily over past week in preparation for this appt; she endorses compliance with medications as below -Current home glucose readings fasting glucose: 94-102 -Current medications: Metformin 500 mg - 1/2  tab daily - Appropriate, Effective, Safe, Accessible Jardiance 10 mg - 1/2 tab daily - Appropriate, Effective, Safe, Accessible Testing supplies -Educated on A1c and blood sugar goals; Complications of diabetes including kidney damage, retinal damage, and cardiovascular disease; benefits of metformin and Jardiance for DM and CV protection -Recommend to continue current medication  Heart Failure / Hypertension (Goal: BP < 140/90) -Controlled- pt completed course of higher-dose furosemide and weight has improved to baseline, swelling/SOB is better, she resumed 3x weekly dosing of furosemide and reports doing well; she also reports BP is much better -Dry weight is 170 lbs; pt has been advised to take extra furosemide 20 mg for wt 170+ ls, extra 40 mg for wt 175+ -Recent Dx OSA - CPAP pending (ordered 04/05/22) -Current home BP/HR readings: SBP 120s -Hx frequent PVCs controlled with amiodarone -Last ejection fraction: 50-55% (Date: 01/28/22) -HF type: Diastolic; NYHA Class: II (slight actitivty limitation) -Current treatment: Carvedilol 25 mg BID - Appropriate, Effective, Safe, Accessible Furosemide 20 mg TThS + extra 20 mg for wt 170+ -Appropriate, Effective, Safe, Accessible Entresto 97-103 mg BID (PAP) -Appropriate, Effective, Safe, Accessible Amiodarone 200 mg - 1/2 tab daily -Appropriate, Effective, Safe, Accessible Jardiance 10 mg -  1/2 tab daily (grant) -Appropriate, Effective, Safe, Accessible -Educated on Benefits of medications for managing symptoms and prolonging life; Importance of blood pressure control; proper BP monitoring technique -Counseled on beta blocker side effects including exercise intolerance; it is likely that carvedilol is contributing to her early fatigue while walking; advised she can exercise early in AM and take carvedilol afterward -Reviewed cost barriers - pt gets Entresto through PAP and Jardiance copay is covered by [place] to continue current  medication  Health Maintenance -Vaccine gaps: TDAP -discussed benefits/risks of TDAP booster, pt is low risk for TD and would like to forego 10-year booster  Patient Goals/Self-Care Activities Patient will:  - take medications as prescribed -focus on medication adherence by pill box -check blood pressure 3x weekly -collaborate with provider on medication access solutions Vania Rea, Delene Loll)     Medication Assistance:  Delene Loll - [employer] PAP approved through cardiology until 12/11/21 -[place] 01/09/22 - 01/08/23  ID: [id number] BIN: [id number] PCN: [id number] GRP: [id number]  Compliance/Adherence/Medication fill history: Care Gaps: None  Star-Rating Drugs: Rosuvastatin - PDC 100% Metformin - PDC 56% (last filled 08/09/21 x 90 ds - pt taking 1/2 tab so actually 180 ds) Jardiance - PDC 97%  Medication Access: Within the past 30 days, how often has patient missed a dose of medication? 0 Is a pillbox or other method used to improve adherence? Yes  Factors that may affect medication adherence? financial need Are meds synced by current pharmacy? No  Are meds delivered by current pharmacy? No  Does patient experience delays in picking up medications due to transportation concerns? No   [facility] Services Reviewed: Is patient disadvantaged to use [facility]?: No  Current Rx insurance plan: [facility] [place] Name and location of Current pharmacy:  [facility], [place] - [address] [place] [address] Phone: [phone] Fax: [phone]  [facility] services reviewed with patient today?: No  Patient requests to transfer care to [facility]?: No  Reason patient declined to change pharmacies: Hesitance/Possibly interested in future   Care Plan and Follow Up Patient Decision:  Patient agrees to Care Plan and Follow-up.  Plan: Telephone follow up appointment  with care management team member scheduled for:  6 months  Charlene Brooke, PharmD, [facility] Clinical Pharmacist [facility] Primary Care [phone]

## 2022-05-11 NOTE — Patient Instructions (Signed)
Visit Information  Phone number for Pharmacist: [phone]   Goals Addressed   None     Care Plan : [place]  Updates made by Charlton Haws, RPH since 05/11/2022 12:00 AM     Problem: Hypertension, Hyperlipidemia, Diabetes, Heart Failure, and Chronic Kidney Disease, PAD   Priority: High     Long-Range Goal: Disease management   Start Date: 09/29/2021  Expected End Date: 09/29/2022  This Visit's Progress: On track  Recent Progress: On track  Priority: High  Note:   Current Barriers:  None identified  Pharmacist Clinical Goal(s):  Patient will contact provider office for questions/concerns as evidenced notation of same in electronic health record through collaboration with PharmD and provider.   Interventions: 1:1 collaboration with Tonia Ghent, MD regarding development and update of comprehensive plan of care as evidenced by provider attestation and co-signature Inter-disciplinary care team collaboration (see longitudinal plan of care) Comprehensive medication review performed; medication list updated in electronic medical record  Hyperlipidemia / PAD (LDL goal < 70) -Query controlled - LDL 81 (03/2021) above goal; pt endorses compliance with new rosuvastatin (changed 09/2021 from lovastatin due to leg cramps/LDL not at goal); repeat lipids have not been checked since switch -New occlusive PAD Sept 2022; considered Xarelto 2.5 mg -Current treatment: Rosuvastatin 10 mg daily - Appropriate, Query Effective Aspirin 81 mg daily - Appropriate, Effective, Safe, Accessible -Previously tried/failed meds: lovastatin (leg cramps) -[facility] -Educated on Cholesterol goals; Benefits of statin for ASCVD risk reduction; Strategies to manage statin-induced myalgias; benefits of aspirin in PAD -Counseled to continued daily exercise routine -Recommend to continue current medication; repeat lipid panel at next  OV  Diabetes (A1c goal <7%) -Controlled - A1c is at goal; pt normally checked BG once a week but has checked daily over past week in preparation for this appt; she endorses compliance with medications as below -Current home glucose readings fasting glucose: 94-102 -Current medications: Metformin 500 mg - 1/2 tab daily - Appropriate, Effective, Safe, Accessible Jardiance 10 mg - 1/2 tab daily - Appropriate, Effective, Safe, Accessible Testing supplies -Educated on A1c and blood sugar goals; Complications of diabetes including kidney damage, retinal damage, and cardiovascular disease; benefits of metformin and Jardiance for DM and CV protection -Recommend to continue current medication  Heart Failure / Hypertension (Goal: BP < 140/90) -Controlled- pt completed course of higher-dose furosemide and weight has improved to baseline, swelling/SOB is better, she resumed 3x weekly dosing of furosemide and reports doing well; she also reports BP is much better -Dry weight is 170 lbs; pt has been advised to take extra furosemide 20 mg for wt 170+ ls, extra 40 mg for wt 175+ -Recent Dx OSA - CPAP pending (ordered 04/05/22) -Current home BP/HR readings: SBP 120s -Hx frequent PVCs controlled with amiodarone -Last ejection fraction: 50-55% (Date: 01/28/22) -HF type: Diastolic; NYHA Class: II (slight actitivty limitation) -Current treatment: Carvedilol 25 mg BID - Appropriate, Effective, Safe, Accessible Furosemide 20 mg TThS + extra 20 mg for wt 170+ -Appropriate, Effective, Safe, Accessible Entresto 97-103 mg BID (PAP) -Appropriate, Effective, Safe, Accessible Amiodarone 200 mg - 1/2 tab daily -Appropriate, Effective, Safe, Accessible Jardiance 10 mg -1/2 tab daily (grant) -Appropriate, Effective, Safe, Accessible -Educated on Benefits of medications for managing symptoms and prolonging life; Importance of blood pressure control; proper BP monitoring technique -Counseled on beta blocker side effects  including exercise intolerance; it is likely that carvedilol is contributing to her early  fatigue while walking; advised she can exercise early in AM and take carvedilol afterward -Reviewed cost barriers - pt gets Entresto through PAP and Jardiance copay is covered by [place] to continue current medication  Health Maintenance -Vaccine gaps: TDAP -discussed benefits/risks of TDAP booster, pt is low risk for TD and would like to forego 10-year booster  Patient Goals/Self-Care Activities Patient will:  - take medications as prescribed -focus on medication adherence by pill box -check blood pressure 3x weekly -collaborate with provider on medication access solutions Vania Rea, Entresto)      Patient verbalizes understanding of instructions and care plan provided today and agrees to view in [place]. Active MyChart status and patient understanding of how to access instructions and care plan via MyChart confirmed with patient.    Telephone follow up appointment with pharmacy team member scheduled for: 6 months  Charlene Brooke, PharmD, [facility] Clinical Pharmacist [facility] [phone]

## 2022-05-12 ENCOUNTER — Ambulatory Visit (INDEPENDENT_AMBULATORY_CARE_PROVIDER_SITE_OTHER): Payer: Medicare Other

## 2022-05-12 VITALS — Ht 62.0 in | Wt 167.4 lb

## 2022-05-12 DIAGNOSIS — Z0000 Encounter for general adult medical examination without abnormal findings: Secondary | ICD-10-CM

## 2022-05-12 NOTE — Patient Instructions (Signed)
Natasha Woodard , Thank you for taking time to come for your Medicare Wellness Visit. I appreciate your ongoing commitment to your health goals. Please review the following plan we discussed and let me know if I can assist you in the future.   Screening recommendations/referrals: Colonoscopy: not required Mammogram: not required Bone Density: completed 07/25/2018 Recommended yearly ophthalmology/optometry visit for glaucoma screening and checkup Recommended yearly dental visit for hygiene and checkup  Vaccinations: Influenza vaccine: due 07/12/2022 Pneumococcal vaccine: completed 03/10/2015 Tdap vaccine: due Shingles vaccine: completed   Covid-19: 03/23/2021, 09/15/2020, 02/29/2020, 02/09/2020  Advanced directives: Please bring a copy of your POA (Power of Attorney) and/or Living Will to your next appointment.   Conditions/risks identified: none  Next appointment: Follow up in one year for your annual wellness visit    Preventive Care 65 Years and Older, Female Preventive care refers to lifestyle choices and visits with your health care provider that can promote health and wellness. What does preventive care include? A yearly physical exam. This is also called an annual well check. Dental exams once or twice a year. Routine eye exams. Ask your health care provider how often you should have your eyes checked. Personal lifestyle choices, including: Daily care of your teeth and gums. Regular physical activity. Eating a healthy diet. Avoiding tobacco and drug use. Limiting alcohol use. Practicing safe sex. Taking low-dose aspirin every day. Taking vitamin and mineral supplements as recommended by your health care provider. What happens during an annual well check? The services and screenings done by your health care provider during your annual well check will depend on your age, overall health, lifestyle risk factors, and family history of disease. Counseling  Your health care provider may  ask you questions about your: Alcohol use. Tobacco use. Drug use. Emotional well-being. Home and relationship well-being. Sexual activity. Eating habits. History of falls. Memory and ability to understand (cognition). Work and work Astronomer. Reproductive health. Screening  You may have the following tests or measurements: Height, weight, and BMI. Blood pressure. Lipid and cholesterol levels. These may be checked every 5 years, or more frequently if you are over 42 years old. Skin check. Lung cancer screening. You may have this screening every year starting at age 75 if you have a 30-pack-year history of smoking and currently smoke or have quit within the past 15 years. Fecal occult blood test (FOBT) of the stool. You may have this test every year starting at age 40. Flexible sigmoidoscopy or colonoscopy. You may have a sigmoidoscopy every 5 years or a colonoscopy every 10 years starting at age 72. Hepatitis C blood test. Hepatitis B blood test. Sexually transmitted disease (STD) testing. Diabetes screening. This is done by checking your blood sugar (glucose) after you have not eaten for a while (fasting). You may have this done every 1-3 years. Bone density scan. This is done to screen for osteoporosis. You may have this done starting at age 79. Mammogram. This may be done every 1-2 years. Talk to your health care provider about how often you should have regular mammograms. Talk with your health care provider about your test results, treatment options, and if necessary, the need for more tests. Vaccines  Your health care provider may recommend certain vaccines, such as: Influenza vaccine. This is recommended every year. Tetanus, diphtheria, and acellular pertussis (Tdap, Td) vaccine. You may need a Td booster every 10 years. Zoster vaccine. You may need this after age 59. Pneumococcal 13-valent conjugate (PCV13) vaccine. One dose is recommended  after age 21. Pneumococcal  polysaccharide (PPSV23) vaccine. One dose is recommended after age 42. Talk to your health care provider about which screenings and vaccines you need and how often you need them. This information is not intended to replace advice given to you by your health care provider. Make sure you discuss any questions you have with your health care provider. Document Released: 12/25/2015 Document Revised: 08/17/2016 Document Reviewed: 09/29/2015 Elsevier Interactive Patient Education  2017 [place] Prevention in the Home Falls can cause injuries. They can happen to people of all ages. There are many things you can do to make your home safe and to help prevent falls. What can I do on the outside of my home? Regularly fix the edges of walkways and driveways and fix any cracks. Remove anything that might make you trip as you walk through a door, such as a raised step or threshold. Trim any bushes or trees on the path to your home. Use bright outdoor lighting. Clear any walking paths of anything that might make someone trip, such as rocks or tools. Regularly check to see if handrails are loose or broken. Make sure that both sides of any steps have handrails. Any raised decks and porches should have guardrails on the edges. Have any leaves, snow, or ice cleared regularly. Use sand or salt on walking paths during winter. Clean up any spills in your garage right away. This includes oil or grease spills. What can I do in the bathroom? Use night lights. Install grab bars by the toilet and in the tub and shower. Do not use towel bars as grab bars. Use non-skid mats or decals in the tub or shower. If you need to sit down in the shower, use a plastic, non-slip stool. Keep the floor dry. Clean up any water that spills on the floor as soon as it happens. Remove soap buildup in the tub or shower regularly. Attach bath mats securely with double-sided non-slip rug tape. Do not have throw rugs and other  things on the floor that can make you trip. What can I do in the bedroom? Use night lights. Make sure that you have a light by your bed that is easy to reach. Do not use any sheets or blankets that are too big for your bed. They should not hang down onto the floor. Have a firm chair that has side arms. You can use this for support while you get dressed. Do not have throw rugs and other things on the floor that can make you trip. What can I do in the kitchen? Clean up any spills right away. Avoid walking on wet floors. Keep items that you use a lot in easy-to-reach places. If you need to reach something above you, use a strong step stool that has a grab bar. Keep electrical cords out of the way. Do not use floor polish or wax that makes floors slippery. If you must use wax, use non-skid floor wax. Do not have throw rugs and other things on the floor that can make you trip. What can I do with my stairs? Do not leave any items on the stairs. Make sure that there are handrails on both sides of the stairs and use them. Fix handrails that are broken or loose. Make sure that handrails are as long as the stairways. Check any carpeting to make sure that it is firmly attached to the stairs. Fix any carpet that is loose or worn. Avoid having throw  rugs at the top or bottom of the stairs. If you do have throw rugs, attach them to the floor with carpet tape. Make sure that you have a light switch at the top of the stairs and the bottom of the stairs. If you do not have them, ask someone to add them for you. What else can I do to help prevent falls? Wear shoes that: Do not have high heels. Have rubber bottoms. Are comfortable and fit you well. Are closed at the toe. Do not wear sandals. If you use a stepladder: Make sure that it is fully opened. Do not climb a closed stepladder. Make sure that both sides of the stepladder are locked into place. Ask someone to hold it for you, if possible. Clearly  mark and make sure that you can see: Any grab bars or handrails. First and last steps. Where the edge of each step is. Use tools that help you move around (mobility aids) if they are needed. These include: Canes. Walkers. Scooters. Crutches. Turn on the lights when you go into a dark area. Replace any light bulbs as soon as they burn out. Set up your furniture so you have a clear path. Avoid moving your furniture around. If any of your floors are uneven, fix them. If there are any pets around you, be aware of where they are. Review your medicines with your doctor. Some medicines can make you feel dizzy. This can increase your chance of falling. Ask your doctor what other things that you can do to help prevent falls. This information is not intended to replace advice given to you by your health care provider. Make sure you discuss any questions you have with your health care provider. Document Released: 09/24/2009 Document Revised: 05/05/2016 Document Reviewed: 01/02/2015 Elsevier Interactive Patient Education  2017 [employer].

## 2022-05-12 NOTE — Progress Notes (Signed)
I connected with Natasha Woodard today by telephone and verified that I am speaking with the correct person using two identifiers. Location patient: home Location provider: work Persons participating in the virtual visit: Natasha Woodard, [name] LPN.   I discussed the limitations, risks, security and privacy concerns of performing an evaluation and management service by telephone and the availability of in person appointments. I also discussed with the patient that there may be a patient responsible charge related to this service. The patient expressed understanding and verbally consented to this telephonic visit.    Interactive audio and video telecommunications were attempted between this provider and patient, however failed, due to patient having technical difficulties OR patient did not have access to video capability.  We continued and completed visit with audio only.     Vital signs may be patient reported or missing.  Subjective:   Natasha Woodard is a 81 y.o. female who presents for Medicare Annual (Subsequent) preventive examination.  Review of Systems     Cardiac Risk Factors include: advanced age (>30mn, >>72women);diabetes mellitus;dyslipidemia;hypertension;obesity (BMI >30kg/m2)     Objective:    Today's Vitals   05/12/22 1611  Weight: 167 lb 6.4 oz (75.9 kg)  Height: _0  (1.575 m)   Body mass index is 30.62 kg/m.     05/12/2022    4:16 PM 10/10/2020    1:35 AM 11/12/2019    3:07 PM 10/10/2019   12:12 PM 11/15/2018    2:44 PM 11/06/2018    2:43 PM 10/16/2017   10:20 AM  Advanced Directives  Does Patient Have a Medical Advance Directive? Yes No Yes No Yes Yes Yes  Type of AParamed[place]of [place] will  [place] will   [place] will [place] will  Does patient want to make changes to medical advance directive?     No - Patient declined  No -  Patient declined  Copy of [place] Chart? No - copy requested  No - copy requested   No - copy requested   Would patient like information on creating a medical advance directive?    No - Patient declined       Current Medications (verified) Outpatient Encounter Medications as of 05/12/2022  Medication Sig   acetaminophen (TYLENOL) 500 MG tablet Take 500 mg by mouth every 6 (six) hours as needed.   amiodarone (PACERONE) 200 MG tablet TAKE 1/2 TABLET BY MOUTH EVERY DAY   aspirin 81 MG tablet Take 1 tablet (81 mg total) by mouth daily.   carvedilol (COREG) 25 MG tablet TAKE 1 TABLET BY MOUTH TWICE DAILY   Cholecalciferol (VITAMIN D) 1000 UNITS capsule Take 1,000 Units by mouth daily.   fluticasone (FLONASE) 50 MCG/ACT nasal spray Place 2 sprays into both nostrils daily. (Patient taking differently: Place 2 sprays into both nostrils as needed.)   furosemide (LASIX) 20 MG tablet Take 1-2 tablets (20-40 mg total) by mouth 3 (three) times a week. Take 20 mg if morning weight is below 170 pounds.  Take 40 mg if morning weight is 175 pounds or higher.   glucose blood (ACCU-CHEK AVIVA PLUS) test strip USE TO TEST BLOOD SUGAR ONCE DAILY FOR DM E11.9   JARDIANCE 10 MG TABS tablet TAKE 1/2 TABLET(5 MG) BY MOUTH DAILY   Lancets (ACCU-CHEK MULTICLIX) lancets USE ONE LANCET TO TEST BLOOD GLUCOSE DAILY FOR DM 250.00   metFORMIN (GLUCOPHAGE) 500 MG tablet TAKE  1/2 TABLET BY MOUTH DAILY WITH A MEAL   nystatin (MYCOSTATIN/NYSTOP) powder Apply 1 application. topically 2 (two) times daily.   rosuvastatin (CRESTOR) 10 MG tablet Take 1 tablet (10 mg total) by mouth daily.   sacubitril-valsartan (ENTRESTO) 97-103 MG Take 1 tablet by mouth 2 (two) times daily.   traMADol (ULTRAM) 50 MG tablet Take 1 tablet (50 mg total) by mouth 3 (three) times daily as needed. (Patient taking differently: Take 50 mg by mouth daily. 2 tablets daily. M W F patient takes three tablets.)   No facility-administered  encounter medications on file as of 05/12/2022.    Allergies (verified) Ezetimibe-simvastatin, Lipitor [atorvastatin], Claritin [loratadine], Tamiflu [oseltamivir phosphate], Pravastatin sodium, and Sulfonamide derivatives   History: Past Medical History:  Diagnosis Date   Calcific tendonitis 2008   Treatment of left leg   Cardiomyopathy ([place]) 06/2017   a) Echo: EF 25-30%. GR 1-2 DD w/ elevated LVEDP. Mild valvular Dz; b) Cardiac MRI 2/'19: frequent PVCs (diffiuclt to interpret) - EF ~27% w/ diffuse HK.  No evidence of infarct, infiltrative Dz or myocarditis. -- ? if related to PVCs. c) f/u Echo 6/'19: EF 35-40%. Gr 1 DD. Diffuse HK.-> d) Jan 2020 EF 50 to 55%.  Mod MAC, mod LAE.  Ao Sclerosis; e) Echo 1/'21 - EF 60-65%, Gr II DD. Mild Mod MR.    Chronic combined systolic and diastolic CHF, NYHA class 2 and ACA/AHA stage C    Now essentially resolved, back to simply diastolic CHF   Coronary artery disease, non-occlusive    mild-moderate CAD 06/30/17 cath   CTS (carpal tunnel syndrome)    Diabetes mellitus    Type II   Dysrhythmia    patient said that she cant remember what it is   Frequent unifocal PVCs 01/2018   Event monitor showed normal sinus rhythm with frequent multifocal PVCs (9%) and PACs.  Nighttime bradycardia suggestive of OSA.   Hiatal hernia    with reflux   Hyperlipidemia    Statin intolerant   Hypertension    Ichthyosis congenita    Intermittent claudication of both lower extremities due to atherosclerosis ([place]) 08/22/2021   LEA Dopplers 09/09/2021:  Right: Total occlusion noted in the superficial femoral artery. Atherosclerosis noted throughout extremity, see note above. Three vessel runoff.  Left: Total occlusion noted in the superficial femoral artery and/or popliteal artery. Total occlusion noted in the distal anterior tibial artery. Athereosclerosis noted throughout extremity.    NSVD (normal spontaneous vaginal delivery) 1971 & 1972   Obesity    Plantar fasciitis     Left   Pulmonary nodule    Imaged multiple times and benign appearing   Wears glasses    Past Surgical History:  Procedure Laterality Date   ABDOMINAL HYSTERECTOMY  1975   TAH-BSO   ABSCESS DRAINAGE  1972   right breast   BREAST BIOPSY Left 01/14/2009   Stereo- Benign   CARDIAC MRI  01/2018   Difficult to interpret 2/2 PVCs. Normal LV size - EF ~27% with diffuse HK. Mild RV dilation - normal fxn. -- NO MYOCARDIAL LGI => no definitive evidence of prior MI, Infiltrative Dz or Myocarditis -- suspect NICM, possibly related to PVCs.   CARPAL TUNNEL RELEASE Right 07/02/2014   Procedure: RIGHT CARPAL TUNNEL RELEASE;  Surgeon: Wynonia Sours, MD;  Location: [place];  Service: Orthopedics;  Laterality: Right;   CARPAL TUNNEL RELEASE Left 06/11/2015   Procedure: LEFT CARPAL TUNNEL RELEASE;  Surgeon: Daryll Brod, MD;  Location: [place];  Service: Orthopedics;  Laterality: Left;  REGIONAL/FAB   COLONOSCOPY     corn removal  1968   right   Holter Monitor  06/2017   ~17,000 PVC beats - majority were singlets, some couplets. 44 brief 3-7 beat runs of NSVT. Also noted were less frequent PACs with 11 runs (longest 15 beats)   OPEN REDUCTION INTERNAL FIXATION (ORIF) DISTAL RADIAL FRACTURE Left 07/21/2017   Procedure: OPEN REDUCTION INTERNAL FIXATION (ORIF) LEFT DISTAL RADIAL FRACTURE;  Surgeon: Renette Butters, MD;  Location: [place];  Service: Orthopedics;  Laterality: Left;   RIGHT/LEFT HEART CATH AND CORONARY ANGIOGRAPHY N/A 06/30/2017   Procedure: Right/Left Heart Cath and Coronary Angiography;  Surgeon: Leonie Man, MD;  Location[facility] INVASIVE CV LAB:  pRCA 55%, pCx 40%, OM1 45%, mCx 50%, D2 50% - LVEF 25-35%. Moderately elevated LVEDP(26 mmHg with PCWP 16 mmHg).  FICK CO/CI: 4.47/2.48. PA pressures 47/14 mmHg with a mean of 27 mm.   ROTATOR CUFF REPAIR     left shoulder   TRANSTHORACIC ECHOCARDIOGRAM  06/2017   EF 25 and 30%. GR 1 DD. Mild diastolic dysfunction  with elevated LVEDP. Mild valvular disease.   TRANSTHORACIC ECHOCARDIOGRAM  11'18, 6/'19    a) EF remains 30-35%.  Diffuse hypokinesis noted.  Severe LA dilation; b) Improved EF 35-40%.  Diffuse HK.  GR 1 DD.  Moderate TR.  Mild RV dilation.   TRANSTHORACIC ECHOCARDIOGRAM  1/'20; 1/'21   a) EF 50 to 55%. Gr I DD. (Notabley improved LV Fxn.  Moderate LA dilation.  Mod MAC.  AoV sclerosis w/o stenosis;; b) EF 60 to 65%.  Normal LV size and function.  GRII DD.  Mild to moderate MR.  Normal PA pressures.   ZIO Patch 14 d Event Monitor  10/2018   Results as below < 1% PVC.  (Notably improved after starting amiodarone)   Family History  Problem Relation Age of Onset   Heart disease Mother        s/p pacemaker   Diabetes Mother    Heart disease Father    Diabetes Father    Heart disease Brother        MI   Cancer Brother        Lung   Hypertension Other    Colon cancer Neg Hx    Breast cancer Neg Hx    Social History   Socioeconomic History   Marital status: Married    Spouse name: Not on file   Number of children: 2   Years of education: Not on file   Highest education level: Not on file  Occupational History    Employer: RETIRED  Tobacco Use   Smoking status: Former    Years: 28.00    Types: Cigarettes    Quit date: 12/12/1993    Years since quitting: 28.4   Smokeless tobacco: Never  Vaping Use   Vaping Use: Never used  Substance and Sexual Activity   Alcohol use: No    Alcohol/week: 0.0 standard drinks   Drug use: No   Sexual activity: Never  Other Topics Concern   Not on file  Social History Narrative   Two kids, two grandchildren, two great grandchildren.    Married 1969   Retired.    Social Determinants of Health   Financial Resource Strain: Low Risk    Difficulty of Paying Living Expenses: Not hard at all  Food Insecurity: No Food Insecurity   Worried About Running  Out of Food in the Last Year: Never true   Ran Out of Food in the Last Year: Never true   Transportation Needs: No Transportation Needs   Lack of Transportation (Medical): No   Lack of Transportation (Non-Medical): No  Physical Activity: Sufficiently Active   Days of Exercise per Week: 6 days   Minutes of Exercise per Session: 60 min  Stress: No Stress Concern Present   Feeling of Stress : Not at all  Social Connections: Not on file    Tobacco Counseling Counseling given: Not Answered   Clinical Intake:  Pre-visit preparation completed: Yes  Pain : No/denies pain     Nutritional Status: BMI > 30  Obese Nutritional Risks: None Diabetes: Yes  How often do you need to have someone help you when you read instructions, pamphlets, or other written materials from your doctor or pharmacy?: 1 - Never What is the last grade level you completed in school?: college  Diabetic? Yes Nutrition Risk Assessment:  Has the patient had any N/V/D within the last 2 months?  No  Does the patient have any non-healing wounds?  No  Has the patient had any unintentional weight loss or weight gain?  No   Diabetes:  Is the patient diabetic?  Yes  If diabetic, was a CBG obtained today?  No  Did the patient bring in their glucometer from home?  No  How often do you monitor your CBG's? weekly.   Financial Strains and Diabetes Management:  Are you having any financial strains with the device, your supplies or your medication? No .  Does the patient want to be seen by Chronic Care Management for management of their diabetes?  No  Would the patient like to be referred to a Nutritionist or for Diabetic Management?  No   Diabetic Exams:  Diabetic Eye Exam: Overdue for diabetic eye exam. Pt has been advised about the importance in completing this exam. Patient advised to call and schedule an eye exam. Diabetic Foot Exam: Overdue, Pt has been advised about the importance in completing this exam. Pt is scheduled for diabetic foot exam on next appointment.   Interpreter Needed?:  No  Information entered by :: NAllen LPN   Activities of Daily Living    05/12/2022    4:18 PM  In your present state of health, do you have any difficulty performing the following activities:  Hearing? 0  Comment has hearing aid  Vision? 0  Difficulty concentrating or making decisions? 0  Walking or climbing stairs? 1  Dressing or bathing? 0  Doing errands, shopping? 0  Preparing Food and eating ? N  Using the Toilet? N  In the past six months, have you accidently leaked urine? Y  Do you have problems with loss of bowel control? N  Managing your Medications? N  Managing your Finances? N  Housekeeping or managing your Housekeeping? N    Patient Care Team: Tonia Ghent, MD as PCP - General (Family Medicine) Leonie Man, MD as PCP - Cardiology (Cardiology) Bensimhon, Shaune Pascal, MD as PCP - Advanced Heart Failure (Cardiology) Wellington Hampshire, MD as PCP - [facility] Cardiology (Cardiology) Hortencia Pilar, MD as Consulting Physician (Ophthalmology) Charlton Haws, [facility] as Pharmacist (Pharmacist)  Indicate any recent Medical Services you may have received from other than [facility] providers in the past year (date may be approximate).     Assessment:   This is a routine wellness examination for [place].  Hearing/Vision screen Vision  Screening - Comments:: Regular eye exams, [place] issues and exercise activities discussed: Current Exercise Habits: Home exercise routine, Type of exercise: walking, Time (Minutes): 60, Frequency (Times/Week): 6, Weekly Exercise (Minutes/Week): 360   Goals Addressed             This Visit's Progress    Patient Stated       05/12/2022, get arthritis under control and walk track without walking stick and rail       Depression Screen    05/12/2022    4:18 PM 04/08/2021    8:21 AM 11/12/2019    3:07 PM 11/15/2018    2:49 PM 11/06/2018    2:42 PM 11/06/2017   11:19 AM 10/16/2017   10:37 AM  PHQ 2/9 Scores  PHQ - 2  Score 0 0 0 0 0 0 0  PHQ- 9 Score  0 0        Fall Risk    05/12/2022    4:17 PM 01/14/2022    3:03 PM 12/25/2020    2:09 PM 11/12/2019    3:07 PM 11/15/2018    2:48 PM  [place] in the past year? 1 0 0 0 0  Comment tripped      Number falls in past yr: 1 0 0 0   Injury with Fall? 1 0 0 0   Comment chipped tooth      Risk for fall due to : Medication side effect No Fall Risks  Medication side effect   Follow up Falls evaluation completed;Education provided;Falls prevention discussed Falls evaluation completed Falls evaluation completed Falls evaluation completed;Falls prevention discussed     FALL RISK PREVENTION PERTAINING TO THE HOME:  Any stairs in or around the home? Yes  If so, are there any without handrails? No  Home free of loose throw rugs in walkways, pet beds, electrical cords, etc? Yes  Adequate lighting in your home to reduce risk of falls? Yes   ASSISTIVE DEVICES UTILIZED TO PREVENT FALLS:  Life alert? No  Use of a cane, walker or w/c? Yes  Grab bars in the bathroom? Yes  Shower chair or bench in shower? No  Elevated toilet seat or a handicapped toilet? No   TIMED UP AND GO:  Was the test performed? No .       Cognitive Function:    11/12/2019    3:10 PM 11/06/2018    2:43 PM 09/15/2016    1:32 PM  MMSE - Mini Mental State Exam  Orientation to time _0 Orientation to Place _1 Registration _2 Attention/ Calculation 5 0 0  Recall _3 Language- name 2 objects  0 0  Language- repeat _4 Language- follow 3 step command  3 3  Language- read & follow direction  0 0  Write a sentence  0 0  Copy design  0 0  Total score  20 20        05/12/2022    4:20 PM  6CIT Screen  What Year? 0 points  What month? 0 points  What time? 0 points  Count back from 20 0 points  Months in reverse 0 points  Repeat phrase 0 points  Total Score 0 points    Immunizations Immunization History  Administered Date(s) Administered   Fluad  Quad(high Dose 65+) 08/19/2021   Influenza Whole 08/12/2010   Influenza,inj,Quad PF,6+ Mos 08/19/2014, 08/15/2015, 09/02/2016,  08/05/2019   Influenza-Unspecified 09/11/2018   PFIZER Comirnaty(Gray Top)Covid-19 Tri-Sucrose Vaccine 03/23/2021   PFIZER(Purple Top)SARS-COV-2 Vaccination 02/09/2020, 02/29/2020, 09/15/2020   Pneumococcal Conjugate-13 03/10/2015   Pneumococcal Polysaccharide-23 05/26/2006   Td 06/14/2007   Zoster Recombinat (Shingrix) 08/12/2020, 10/12/2020   Zoster, Live 08/10/2006    TDAP status: Due, Education has been provided regarding the importance of this vaccine. Advised may receive this vaccine at local pharmacy or Health Dept. Aware to provide a copy of the vaccination record if obtained from local pharmacy or Health Dept. Verbalized acceptance and understanding.  Flu Vaccine status: Up to date  Pneumococcal vaccine status: Up to date  Covid-19 vaccine status: Completed vaccines  Qualifies for Shingles Vaccine? Yes   Zostavax completed Yes   Shingrix Completed?: Yes  Screening Tests Health Maintenance  Topic Date Due   TETANUS/TDAP  06/13/2017   COVID-19 Vaccine (5 - Booster for Pfizer series) 05/18/2021   OPHTHALMOLOGY EXAM  10/06/2021   FOOT EXAM  04/06/2022   INFLUENZA VACCINE  07/12/2022   HEMOGLOBIN A1C  07/14/2022   Pneumonia Vaccine 79+ Years old  Completed   DEXA SCAN  Completed   Zoster Vaccines- Shingrix  Completed   HPV VACCINES  Aged Out    Health Maintenance  Health Maintenance Due  Topic Date Due   TETANUS/TDAP  06/13/2017   COVID-19 Vaccine (5 - Booster for Pfizer series) 05/18/2021   OPHTHALMOLOGY EXAM  10/06/2021   FOOT EXAM  04/06/2022    Colorectal cancer screening: No longer required.   Mammogram status: not required  Bone Density status: Completed 07/25/2018.  Lung Cancer Screening: (Low Dose CT Chest recommended if Age 10-80 years, 30 pack-year currently smoking OR have quit w/in 15years.) does not qualify.   Lung  Cancer Screening Referral: no  Additional Screening:  Hepatitis C Screening: does not qualify;   Vision Screening: Recommended annual ophthalmology exams for early detection of glaucoma and other disorders of the eye. Is the patient up to date with their annual eye exam?  Yes  Who is the provider or what is the name of the office in which the patient attends annual eye exams? [facility] If pt is not established with a provider, would they like to be referred to a provider to establish care? No .   Dental Screening: Recommended annual dental exams for proper oral hygiene  Community Resource Referral / Chronic Care Management: CRR required this visit?  No   CCM required this visit?  No      Plan:     I have personally reviewed and noted the following in the patient's chart:   Medical and social history Use of alcohol, tobacco or illicit drugs  Current medications and supplements including opioid prescriptions.  Functional ability and status Nutritional status Physical activity Advanced directives List of other physicians Hospitalizations, surgeries, and ER visits in previous 12 months Vitals Screenings to include cognitive, depression, and falls Referrals and appointments  In addition, I have reviewed and discussed with patient certain preventive protocols, quality metrics, and best practice recommendations. A written personalized care plan for preventive services as well as general preventive health recommendations were provided to patient.     Kellie Simmering, LPN   03/12/6605   Nurse Notes: none  Due to this being a virtual visit, the after visit summary with patients personalized plan was offered to patient via mail or my-chart. Patient would like to access on my-chart

## 2022-05-14 ENCOUNTER — Other Ambulatory Visit (HOSPITAL_COMMUNITY): Payer: Self-pay | Admitting: Cardiology

## 2022-05-14 ENCOUNTER — Other Ambulatory Visit: Payer: Self-pay | Admitting: Family Medicine

## 2022-05-25 ENCOUNTER — Encounter: Payer: Self-pay | Admitting: Family Medicine

## 2022-05-25 MED ORDER — ACCU-CHEK MULTICLIX LANCETS MISC
3 refills | Status: DC
Start: 2022-05-25 — End: 2023-01-12

## 2022-05-26 DIAGNOSIS — G4733 Obstructive sleep apnea (adult) (pediatric): Secondary | ICD-10-CM | POA: Diagnosis not present

## 2022-06-03 ENCOUNTER — Telehealth: Payer: Self-pay

## 2022-06-03 ENCOUNTER — Telehealth: Payer: Self-pay | Admitting: Cardiology

## 2022-06-03 ENCOUNTER — Ambulatory Visit
Admission: EM | Admit: 2022-06-03 | Discharge: 2022-06-03 | Disposition: A | Discharge status: Home / Self Care | Payer: Medicare Other | Attending: Urgent Care | Admitting: Urgent Care

## 2022-06-03 DIAGNOSIS — J209 Acute bronchitis, unspecified: Secondary | ICD-10-CM

## 2022-06-03 DIAGNOSIS — G4733 Obstructive sleep apnea (adult) (pediatric): Secondary | ICD-10-CM | POA: Diagnosis not present

## 2022-06-03 MED ORDER — IPRATROPIUM-ALBUTEROL 0.5-2.5 (3) MG/3ML IN SOLN
3.0000 mL | Freq: Once | RESPIRATORY_TRACT | Status: AC
  Administered 2022-06-03: 3 mL via RESPIRATORY_TRACT

## 2022-06-03 MED ORDER — COMBIVENT RESPIMAT 20-100 MCG/ACT IN AERS: 1.0000 | INHALATION_SPRAY | Freq: Four times a day (QID) | RESPIRATORY_TRACT | 0 refills | Status: DC | PRN

## 2022-06-03 NOTE — Telephone Encounter (Signed)
Patient was returning phone call 

## 2022-06-03 NOTE — ED Triage Notes (Signed)
 Patient presents to Urgent Care with complaints of sob and wheezing that has increased with intensity for 3 weeks now pt st she has been struggling with sob for years but not this bad. Pt spoke with pcp and was instructed to come to urgent care. Pt reports difficulty completing adls.   Is unsure if prior sob is related to heart or lungs.

## 2022-06-08 NOTE — Progress Notes (Signed)
HPI female former smoker followed for OSA, complicated by dilated cardiomyopathy, CHF/systolic and diastolic, DM 2, HBP, ichthyosis HST-10/24/2018 Complex (central and obstructive) sleep apnea, AHI 5.9/hour, desaturation to 81%, body weight 178 pounds HST 03/17/22- AHI 25.5/ hr, desaturation to 77%(mean88%), body weight 178 lbs ============================================================   01/31/22- 81 year old female former smoker previously followed for mild OSA, complicated by dilated cardiomyopathy, CHF/systolic and diastolic,,  Gr2DD PAD, DM 2, HBP, ichthyosis Coming now to reestablish. -----Patient was seen here in 2019 for possible OSA and was told that she did not have it at that time. Is here to be retested.  Epworth score-10 Body weight today- Covid vax-4 Phizer Flu vax-had She is told by her husband that she snores.  She has an extensive family history of OSA and wonders why it skipped her.  We again discussed risk factors, medical concerns and treatment options.  She has been using tape to hold her mouth closed at night and thinks this helps. ECHO 01/28/22- 50-55%, Gr2 DD CXR 01/14/22- IMPRESSION: 1. Cardiomegaly with mild pulmonary vascular congestion.  06/09/22- 81 year old female former smoker previously followed for mild OSA, complicated by dilated cardiomyopathy, CHF/systolic and diastolic,/ amiodsarone,  Gr2DD PAD, DM 2, HBP, ichthyosis Covid vax- 4 Phizer -Combivent,  HST 03/17/22- AHI 25.5/ hr, desaturation to 77%(mean 88%), body weight 178 lbs CPAP auto5-15/ [employer] ordered 04/05/22 Download compliance- 97%, AHI 5.4/ hr Body weight today-168 lbs Urgent Care 6/23 for acute bronchitis> neb Duoneb. Suggested PFT since on amiodarone. Noted SOB worse since starting CPAP.  -----Patient was recently seen at urgent care and DX with bronchitis. She states that she is feeling a little better still having shortness of breath with activity. Denies cough. Patient is sleeping good.  Note pt  had vascular congestion on CXR and elevated BNP in February. Download reviewed.  She is adjusting to CPAP with a fullface mask and thinks she is sleeping fairly well.  We compared current residual AHI of 5.4/hour with her baseline AHI of 25.5 on her sleep study. She still has cough since her urgent care visit last week.  She says she never had purulent sputum, sore throat or fever.  I compared symptoms of acute bronchitis with symptoms of mild pulmonary vascular congestion as noted on chest x-ray and with elevated BNP in February.  She has been on amiodarone and may eventually benefit from HRCT but in the short run it will help to clarify if she seems volume overloaded.  ROS-see HPI   + = positive Constitutional:    weight loss, night sweats, fevers, chills, fatigue, lassitude. HEENT:    headaches, difficulty swallowing, tooth/dental problems, sore throat,       sneezing, itching, ear ache, nasal congestion, post nasal drip, snoring CV:    chest pain, orthopnea, PND, swelling in lower extremities, anasarca,         dizziness, +palpitations Resp:   +shortness of breath with exertion or at rest.                productive cough,   +non-productive cough, coughing up of blood.              change in color of mucus.  wheezing.   Skin:    +rash or lesions. GI:  No-   heartburn, indigestion, abdominal pain, nausea, vomiting, diarrhea,                 change in bowel habits, loss of appetite GU: dysuria, change in color of urine, no urgency or  frequency.   flank pain. MS:   joint pain, stiffness, decreased range of motion, back pain. Neuro-     nothing unusual Psych:  change in mood or affect.  depression or anxiety.   memory loss.  OBJ- Physical Exam General- Alert, Oriented, Affect-appropriate, Distress- none acute, + overweight Skin- + ichthyosis with hyperpigmentation Lymphadenopathy- none Head- atraumatic            Eyes- Gross vision intact, PERRLA, conjunctivae and secretions clear             Ears-+ hard of hearing            Nose- turbinate edema, no-Septal dev, mucus, polyps, erosion, perforation             Throat- Mallampati IV , mucosa clear , drainage- none, tonsils- atrophic, + own teeth Neck- flexible , trachea midline, no stridor , thyroid nl, carotid no bruit Chest - symmetrical excursion , unlabored           Heart/CV- RRR , no murmur , no gallop  , no rub, nl s1 s2                           - JVD- none , edema- none, stasis changes- none, varices- none           Lung- clear to P&A, wheeze- none, cough+ mild , dullness-none, rub- none           Chest wall-  Abd-  Br/ Gen/ Rectal- Not done, not indicated Extrem- cyanosis- none, clubbing, none, atrophy- none, strength- nl Neuro- grossly intact to observation

## 2022-06-09 ENCOUNTER — Encounter: Payer: Self-pay | Admitting: Internal Medicine

## 2022-06-09 ENCOUNTER — Ambulatory Visit: Payer: Medicare Other | Admitting: Internal Medicine

## 2022-06-09 VITALS — BP 122/62 | HR 67 | Temp 98.3°F | Ht 62.0 in | Wt 168.6 lb

## 2022-06-09 DIAGNOSIS — J209 Acute bronchitis, unspecified: Secondary | ICD-10-CM | POA: Diagnosis not present

## 2022-06-09 DIAGNOSIS — I5032 Chronic diastolic (congestive) heart failure: Secondary | ICD-10-CM | POA: Diagnosis not present

## 2022-06-09 DIAGNOSIS — G4731 Primary central sleep apnea: Secondary | ICD-10-CM

## 2022-06-09 NOTE — Assessment & Plan Note (Signed)
This is primarily OSA and she is benefiting from CPAP with good compliance and satisfactory control. Plan-continue auto 5-15

## 2022-06-09 NOTE — Assessment & Plan Note (Signed)
Diagnoses acute bronchitis at urgent care on 623.  CXR and BNP last February gave evidence of pulmonary vascular congestion.  Recognizing potential symptom overlap, I will recheck those labs although she does not have right-sided failure signs on exam. If she is not fluid overloaded, consider if HRCT would be appropriate looking for fibrosis because of her history of amiodarone therapy. Plan-CXR, BNP

## 2022-06-09 NOTE — Patient Instructions (Addendum)
We can continue CPAP auto 5-15  Order- CXR    dx acute bronchitis  Order- lab- BNatPeptide   dx CHF  Order- schedule PFT   dx acute bronchitis  Please call if we can help

## 2022-06-10 ENCOUNTER — Ambulatory Visit (INDEPENDENT_AMBULATORY_CARE_PROVIDER_SITE_OTHER)
Admission: RE | Admit: 2022-06-10 | Discharge: 2022-06-10 | Disposition: A | Discharge status: Home / Self Care | Payer: Medicare Other | Source: Ambulatory Visit | Attending: Internal Medicine | Admitting: Internal Medicine

## 2022-06-10 ENCOUNTER — Other Ambulatory Visit: Payer: Medicare Other

## 2022-06-10 DIAGNOSIS — J209 Acute bronchitis, unspecified: Secondary | ICD-10-CM

## 2022-06-10 DIAGNOSIS — R0602 Shortness of breath: Secondary | ICD-10-CM | POA: Diagnosis not present

## 2022-06-10 DIAGNOSIS — I10 Essential (primary) hypertension: Secondary | ICD-10-CM | POA: Diagnosis not present

## 2022-06-10 LAB — PRO B NATRIURETIC PEPTIDE: NT-Pro BNP: 4638 pg/mL — ABNORMAL HIGH (ref 0–738)

## 2022-06-13 ENCOUNTER — Telehealth: Payer: Self-pay

## 2022-06-13 ENCOUNTER — Ambulatory Visit: Payer: Medicare Other | Admitting: Family Medicine

## 2022-06-13 NOTE — Telephone Encounter (Signed)
Patient stated she is still sob. She went to urgent care on 6/23 and was given an inhaler that has not helped her sob. She is taking lasix 20mg  on Tuesdays, Thursdays and Saturdays. While on phone, I noticed patient's sob. BP 139/65, P 40. Dr. 03-09-1991 (DOD) and patient's cardiologist ordered patient to: Stop taking Amiodarone for 2 days and then start taking amiodarone 100mg  (one-half tablet) every other day. Take furosemide 40mg  (2 tablets) for the next 3 days, and then take lasix 20mg  (one tablet) every day until no longer having shortness of breath. Patient wrote these instructions down and repeated them back to me. Patient wanted these instrctuions uploaded to MyChart. Done.

## 2022-06-13 NOTE — Progress Notes (Signed)
Chronic Care Management Pharmacy Assistant   Name: Natasha Woodard  MRN: [id number] DOB: 04/25/1941  Reason for Encounter: CCM (Chronic Heart Failure Disease State)   Recent office visits:  05/12/22 AWV  Recent consult visits:  06/09/22 Jetty Duhamel, MD (Pulmonology) Acute Bronchitis Abnormal Labs: "Pro BNP test is quite elevated, indicating that her chest congestion may be from congestive heart failure, not bronchitis. She needs to see her cardiologist about this." Reply back from Bryan Lemma, MD "BNP level seems to be a little up. It actually could mean that the bronchitis is irritating the heart and not just that is the heart along. Probably what we need to do is increase back the Lasix dosing.  Need to make sure that she is managing her daily weights and adjusting her Lasix accordingly. I just we will probably need to get her into see either myself or an APP."  Order DG Chest  06/03/22  Urgent Care Acute Bronchitis Start: Combivent Respimat 20-100 mcg  Hospital visits:  None in previous 6 months  Medications: Outpatient Encounter Medications as of 06/13/2022  Medication Sig   acetaminophen (TYLENOL) 500 MG tablet Take 500 mg by mouth every 6 (six) hours as needed.   amiodarone (PACERONE) 200 MG tablet TAKE 1/2 TABLET BY MOUTH EVERY DAY   aspirin 81 MG tablet Take 1 tablet (81 mg total) by mouth daily.   carvedilol (COREG) 25 MG tablet TAKE 1 TABLET BY MOUTH TWICE DAILY   Cholecalciferol (VITAMIN D) 1000 UNITS capsule Take 1,000 Units by mouth daily.   fluticasone (FLONASE) 50 MCG/ACT nasal spray Place 2 sprays into both nostrils daily. (Patient taking differently: Place 2 sprays into both nostrils as needed.)   furosemide (LASIX) 20 MG tablet TAKE 1 TABLET BY MOUTH EVERY MONDAY, WEDNESDAY, AND FRIDAY   glucose blood (ACCU-CHEK AVIVA PLUS) test strip USE TO TEST BLOOD SUGAR ONCE DAILY FOR DM E11.9   Ipratropium-Albuterol (COMBIVENT RESPIMAT) 20-100 MCG/ACT AERS  respimat Inhale 1 puff into the lungs every 6 (six) hours as needed for wheezing or shortness of breath.   JARDIANCE 10 MG TABS tablet TAKE 1/2 TABLET(5 MG) BY MOUTH DAILY   Lancets (ACCU-CHEK MULTICLIX) lancets USE ONE LANCET TO TEST BLOOD GLUCOSE DAILY FOR DM 250.00   metFORMIN (GLUCOPHAGE) 500 MG tablet TAKE 1/2 TABLET BY MOUTH DAILY WITH A MEAL   nystatin (MYCOSTATIN/NYSTOP) powder Apply 1 application. topically 2 (two) times daily.   rosuvastatin (CRESTOR) 10 MG tablet Take 1 tablet (10 mg total) by mouth daily.   sacubitril-valsartan (ENTRESTO) 97-103 MG Take 1 tablet by mouth 2 (two) times daily.   traMADol (ULTRAM) 50 MG tablet Take 1 tablet (50 mg total) by mouth 3 (three) times daily as needed. (Patient taking differently: Take 50 mg by mouth daily. 2 tablets daily. M W F patient takes three tablets.)   No facility-administered encounter medications on file as of 06/13/2022.    Contacted patient on 06/13/2022 to discuss CHF disease state  Current heart failure regimen: Carvedilol 25 mg BID  Furosemide 20 mg TThS + extra 20 mg for wt 170+  Entresto 97-103 mg BID (PAP)  Amiodarone 200 mg - 1/2 tab daily  Jardiance 10 mg -1/2 tab daily (grant)  Are you taking a diuretic? Yes - Furosemide 20 mg TThS + extra 20 mg for wt 170+  How often: 3 days a week  Do you weigh yourself daily or regularly? Yes Stays under 170   Have any of the following symptoms  worsened or changed from your baseline?   shortness of breath  Do you see a cardiologist? Yes             Name: Bryan Lemma, MD  Last visit? 03/16/2022 Upcoming visit? 09/02/2022  How often are you checking your Blood Pressure? weekly - patient did not have any readings for me. I asked patient to take her blood pressure daily starting today and keep a log. I advised patient I would call her back on 07/07 for her log. Patient agreed and understood.   Wrist or arm cuff: Arm cuff Caffeine intake: No caffeine - only decaf  coffee Salt intake: Adds salt when she is cooking  OTC medications including pseudoephedrine or NSAIDs? Tylenol; 2 daily; 650 mg for arthritis spin pain Exercise habits: Walks six days a week for 30 minutes a day around the track at the [facility]  Patient stated she has been getting extremely short of breath for the past 3-4 weeks. Patient stated it is all the time. She can not even walk 4 feet without getting winded. She can not go up steps. Patient went to Urgent Care on 06/03/22 for the issue and was prescribed Combivent Respimat 20-100 MCG/ACT. Patient stated she has been using it every 6 hours as directed and it is not helping. Patient did call Cardiology this weekend and left a voicemail, but did not hear back from them. Patient usually walks daily at the [facility] track and she is afraid to do so. I advised patient to refrain from all exercise until she hears back from Cardiology.   Star Rating Drugs:  Medication:    Last Fill: Day Supply Jardiance 10 mg   03/14/2022 90 - [facility] is filling Metformin 500 mg   03/14/2022 90 - [facility] is filling Rosuvastatin 10 mg   04/11/2022 90    Sacubitril-Valsartan 97-103 mg PAP Fill dates verified with [facility]  Care Gaps: Annual wellness visit in last year? Yes 05/12/22 Most Recent BP reading: 122/62 on 06/09/2022  If Diabetic: Most recent A1C reading: 6.4 on 01/14/2022 Last eye exam / retinopathy screening: 10/06/2020 - Patient would like to utilize the eye exam services at [facility] on 08/24/2022. She would like a call back with an appointment time. Message has been sent to Mayra Reel, NP.  Last diabetic foot exam: 04/06/2021  Cardiology appointment on 08/02/22 and 09/02/22 Pulmonology appointment on 10/10/22 CCM appointment on 10/2022  Al Corpus, CPP notified  Claudina Lick, [place] Clinical Pharmacy Assistant [phone]

## 2022-06-13 NOTE — Telephone Encounter (Signed)
-----   Message from Claudina Lick, [place] sent at 06/13/2022 12:44 PM EDT ----- Regarding: Shortness of Breath Good afternoon Natasha Woodard, I just got off the phone with Natasha Woodard. I wanted to direct this to you since you have been seeing her for this issue. Please find below the conversation we had. If you wouldn't mind reaching out to her I would appreciate it. Thank you so much!  "Patient stated she has been getting extremely short of breath for the past 3-4 weeks. Patient stated it is all the time. She can not even walk 4 feet without getting winded. She can not go up steps. Patient went to Urgent Care on 06/03/22 for the issue and was prescribed Combivent Respimat 20-100 MCG/ACT. Patient stated she has been using it every 6 hours as directed and it is not helping. Patient did call Cardiology this weekend and left a voicemail, but did not hear back from them. Patient usually walks daily at the [facility] track and she is afraid to do so. I advised patient to refrain from all exercise until she hears back from Cardiology."  Amy

## 2022-06-15 NOTE — Telephone Encounter (Signed)
Contacted patient again, LVM to keep upcoming appointment with NP on 07/07.  Call back if needed, left call back number.

## 2022-06-16 NOTE — Progress Notes (Signed)
Spoke with pt and notified of results per Dr. Young Pt verbalized understanding and denied any questions. 

## 2022-06-17 ENCOUNTER — Encounter (HOSPITAL_BASED_OUTPATIENT_CLINIC_OR_DEPARTMENT_OTHER): Payer: Self-pay | Admitting: Family

## 2022-06-17 ENCOUNTER — Ambulatory Visit (HOSPITAL_BASED_OUTPATIENT_CLINIC_OR_DEPARTMENT_OTHER): Payer: Medicare Other | Admitting: Family

## 2022-06-17 VITALS — BP 142/74 | HR 71 | Ht 62.0 in | Wt 168.0 lb

## 2022-06-17 DIAGNOSIS — I493 Ventricular premature depolarization: Secondary | ICD-10-CM | POA: Diagnosis not present

## 2022-06-17 DIAGNOSIS — Z6830 Body mass index (BMI) 30.0-30.9, adult: Secondary | ICD-10-CM | POA: Diagnosis not present

## 2022-06-17 DIAGNOSIS — I5032 Chronic diastolic (congestive) heart failure: Secondary | ICD-10-CM

## 2022-06-17 DIAGNOSIS — Z79899 Other long term (current) drug therapy: Secondary | ICD-10-CM

## 2022-06-17 MED ORDER — AMIODARONE HCL 200 MG PO TABS: ORAL_TABLET | ORAL | 3 refills | Status: DC

## 2022-06-17 MED ORDER — FUROSEMIDE 20 MG PO TABS: 20.0000 mg | ORAL_TABLET | Freq: Every day | ORAL | 3 refills | Status: DC

## 2022-06-17 NOTE — Progress Notes (Unsigned)
Office Visit    Patient Name: Natasha Woodard Date of Encounter: 06/17/2022  PCP:  Tonia Ghent, MD   [facility]  Cardiologist:  Glenetta Hew, MD  Advanced Practice Provider:  No care team member to display Electrophysiologist:  None      Chief Complaint    Natasha Woodard is a 81 y.o. female presents today for shortness of breath  Past Medical History    Past Medical History:  Diagnosis Date   Calcific tendonitis 2008   Treatment of left leg   Cardiomyopathy ([place]) 06/2017   a) Echo: EF 25-30%. GR 1-2 DD w/ elevated LVEDP. Mild valvular Dz; b) Cardiac MRI 2/'19: frequent PVCs (diffiuclt to interpret) - EF ~27% w/ diffuse HK.  No evidence of infarct, infiltrative Dz or myocarditis. -- ? if related to PVCs. c) f/u Echo 6/'19: EF 35-40%. Gr 1 DD. Diffuse HK.-> d) Jan 2020 EF 50 to 55%.  Mod MAC, mod LAE.  Ao Sclerosis; e) Echo 1/'21 - EF 60-65%, Gr II DD. Mild Mod MR.    Chronic combined systolic and diastolic CHF, NYHA class 2 and ACA/AHA stage C    Now essentially resolved, back to simply diastolic CHF   Coronary artery disease, non-occlusive    mild-moderate CAD 06/30/17 cath   CTS (carpal tunnel syndrome)    Diabetes mellitus    Type II   Dysrhythmia    patient said that she cant remember what it is   Frequent unifocal PVCs 01/2018   Event monitor showed normal sinus rhythm with frequent multifocal PVCs (9%) and PACs.  Nighttime bradycardia suggestive of OSA.   Hiatal hernia    with reflux   Hyperlipidemia    Statin intolerant   Hypertension    Ichthyosis congenita    Intermittent claudication of both lower extremities due to atherosclerosis ([place]) 08/22/2021   LEA Dopplers 09/09/2021:  Right: Total occlusion noted in the superficial femoral artery. Atherosclerosis noted throughout extremity, see note above. Three vessel runoff.  Left: Total occlusion noted in the superficial femoral artery and/or popliteal artery. Total occlusion noted in  the distal anterior tibial artery. Athereosclerosis noted throughout extremity.    NSVD (normal spontaneous vaginal delivery) 1971 & 1972   Obesity    Plantar fasciitis    Left   Pulmonary nodule    Imaged multiple times and benign appearing   Wears glasses    Past Surgical History:  Procedure Laterality Date   ABDOMINAL HYSTERECTOMY  1975   TAH-BSO   ABSCESS DRAINAGE  1972   right breast   BREAST BIOPSY Left 01/14/2009   Stereo- Benign   CARDIAC MRI  01/2018   Difficult to interpret 2/2 PVCs. Normal LV size - EF ~27% with diffuse HK. Mild RV dilation - normal fxn. -- NO MYOCARDIAL LGI => no definitive evidence of prior MI, Infiltrative Dz or Myocarditis -- suspect NICM, possibly related to PVCs.   CARPAL TUNNEL RELEASE Right 07/02/2014   Procedure: RIGHT CARPAL TUNNEL RELEASE;  Surgeon: Wynonia Sours, MD;  Location: [place];  Service: Orthopedics;  Laterality: Right;   CARPAL TUNNEL RELEASE Left 06/11/2015   Procedure: LEFT CARPAL TUNNEL RELEASE;  Surgeon: Daryll Brod, MD;  Location: [place];  Service: Orthopedics;  Laterality: Left;  REGIONAL/FAB   COLONOSCOPY     corn removal  1968   right   Holter Monitor  06/2017   ~17,000 PVC beats - majority were singlets, some couplets. [place]  brief 3-7 beat runs of NSVT. Also noted were less frequent PACs with 11 runs (longest 15 beats)   OPEN REDUCTION INTERNAL FIXATION (ORIF) DISTAL RADIAL FRACTURE Left 07/21/2017   Procedure: OPEN REDUCTION INTERNAL FIXATION (ORIF) LEFT DISTAL RADIAL FRACTURE;  Surgeon: Renette Butters, MD;  Location: [place];  Service: Orthopedics;  Laterality: Left;   RIGHT/LEFT HEART CATH AND CORONARY ANGIOGRAPHY N/A 06/30/2017   Procedure: Right/Left Heart Cath and Coronary Angiography;  Surgeon: Leonie Man, MD;  Location: [facility] INVASIVE CV LAB:  pRCA 55%, pCx 40%, OM1 45%, mCx 50%, D2 50% - LVEF 25-35%. Moderately elevated LVEDP(26 mmHg with PCWP 16 mmHg).  FICK CO/CI: 4.47/2.48. PA  pressures 47/14 mmHg with a mean of 27 mm.   ROTATOR CUFF REPAIR     left shoulder   TRANSTHORACIC ECHOCARDIOGRAM  06/2017   EF 25 and 30%. GR 1 DD. Mild diastolic dysfunction with elevated LVEDP. Mild valvular disease.   TRANSTHORACIC ECHOCARDIOGRAM  11'18, 6/'19    a) EF remains 30-35%.  Diffuse hypokinesis noted.  Severe LA dilation; b) Improved EF 35-40%.  Diffuse HK.  GR 1 DD.  Moderate TR.  Mild RV dilation.   TRANSTHORACIC ECHOCARDIOGRAM  1/'20; 1/'21   a) EF 50 to 55%. Gr I DD. (Notabley improved LV Fxn.  Moderate LA dilation.  Mod MAC.  AoV sclerosis w/o stenosis;; b) EF 60 to 65%.  Normal LV size and function.  GRII DD.  Mild to moderate MR.  Normal PA pressures.   ZIO Patch 14 d Event Monitor  10/2018   Results as below < 1% PVC.  (Notably improved after starting amiodarone)    Allergies  Allergies  Allergen Reactions   Ezetimibe-Simvastatin Other (See Comments)    REACTION: Muscle aches (side effect)   Lipitor [Atorvastatin] Other (See Comments)    Leg weakness    Claritin [Loratadine]     Possible cause of nightmares.     Tamiflu [Oseltamivir Phosphate] Other (See Comments)    nightmares   Pravastatin Sodium Other (See Comments)    REACTION: Muscle aches (side effect)   Sulfonamide Derivatives Nausea And Vomiting    History of Present Illness    Natasha Woodard is a 81 y.o. female with a hx of dilated cardiomyopathy, PVC on amiodarone therapy, HFpEF, hyperlipidemia, DM 2, PAD last seen 03/16/2022 by Dr. Ellyn Hack.  July 2018 was referred for frequent PVCs and heart failure symptoms.  EF 25-30% at that time with monitor revealing 70% PVC burden.  Right and left heart cath revealed normal coronaries, LVEDP 26 mmHg, PCWP 16 mmHg.  Amiodarone added for PVC suppression.  Subsequent improvement in EF 05/2018 EF 35-40% in December 31 2158-65%.  Graduated from the advanced heart failure clinic 03/2021.  September 2022 underwent LAA Dopplers revealing severe occlusive PAD evaluated  by Dr. Fletcher Anon recommended for medical management and walking routine.  She was seen by pulmonology 01/2022 recommended for sleep study which revealed OSA subsequently started on CPAP.  Updated echo 01/2019 through EF 50 to 70%, grade 2 diastolic dysfunction, LA severely dilated, RA mildly dilated, normal PASP, mild MR, mild to moderate TR.  She come to the office 06/03/2022 due to shortness of breath for 3 weeks.  Given worsening of symptoms over the previous 3 weeks encouraged to present to urgent care for evaluation.  At urgent care she described as tightness in her chest and wheezing.  Was not positional.  She was provided nebulizer in office and symptoms improved diagonally.  She  was sent home with prescription for as needed DuoNeb.  Saw pulmonology 06/09/2022.  Plan to repeat chest x-ray, BNP and if not volume overloaded consider HRCT.  06/11/2022 chest x-ray with no new clusters, bibasilar streaky air base opacities likely atelectasis, no pulmonary edema, no pleural effusion.  BNP 4638. On 06/13/22 she was instructed to stop Amiodarone for 2 days then reduce Amiodarone 168m every other day. She was also instructed Lasix 475mfor 3 days then Lasix 2098maily. ***  She presents today for follow-up independently.  Her breathing is a bit better but has not improved back to her baseline. Notes if she gets up and walks even room to room in her house feels short of breath. Feels her breathing was last "good" about a month ago. Feels breathing is 10-25% better than it was.   She does note palpitations once every 2-3 weeks that lasts only a split second. ***  She has been using her inhaler every 6 hours. She is using her CPAP regularly.   BP 138-145 prior to morning medications.   Her weight at home has decreased since taking extra fluid pill. It was 164 pounds. Weight last week 167-169. *** Last 170 pounds about a month ago.   She continues to walk for exercise 30 minutes daily. Has not been able to do so.  45 minutes to an hour.  ***  EKGs/Labs/Other Studies Reviewed:   The following studies were reviewed today:  EKG:  EKG is ordered today.  The ekg ordered today demonstrates NSR 71 bpm with frequent PVC,IVCD  Recent Labs: 01/14/2022: ALT 18; Brain Natriuretic Peptide 1,333; BUN 17; Creat 1.28; Hemoglobin 13.5; Platelets 287; Potassium 4.9; Sodium 144 06/09/2022: NT-Pro BNP 4,638  Recent Lipid Panel    Component Value Date/Time   CHOL 141 03/19/2021 1121   CHOL 139 10/09/2020 0933   TRIG 89.0 03/19/2021 1121   HDL 42.10 03/19/2021 1121   HDL 42 10/09/2020 0933   CHOLHDL 3 03/19/2021 1121   VLDL 17.8 03/19/2021 1121   LDLCALC 81 03/19/2021 1121   LDLCALC 80 10/09/2020 0933   LDLDIRECT 53 08/10/2009 0000    Home Medications   Current Meds  Medication Sig   acetaminophen (TYLENOL) 500 MG tablet Take 500 mg by mouth every 6 (six) hours as needed.   amiodarone (PACERONE) 200 MG tablet TAKE 1/2 TABLET BY MOUTH EVERY DAY   aspirin 81 MG tablet Take 1 tablet (81 mg total) by mouth daily.   carvedilol (COREG) 25 MG tablet TAKE 1 TABLET BY MOUTH TWICE DAILY   Cholecalciferol (VITAMIN D) 1000 UNITS capsule Take 1,000 Units by mouth daily.   furosemide (LASIX) 20 MG tablet TAKE 1 TABLET BY MOUTH EVERY MONDAY, WEDNESDAY, AND FRIDAY   glucose blood (ACCU-CHEK AVIVA PLUS) test strip USE TO TEST BLOOD SUGAR ONCE DAILY FOR DM E11.9   Ipratropium-Albuterol (COMBIVENT RESPIMAT) 20-100 MCG/ACT AERS respimat Inhale 1 puff into the lungs every 6 (six) hours as needed for wheezing or shortness of breath.   JARDIANCE 10 MG TABS tablet TAKE 1/2 TABLET(5 MG) BY MOUTH DAILY   Lancets (ACCU-CHEK MULTICLIX) lancets USE ONE LANCET TO TEST BLOOD GLUCOSE DAILY FOR DM 250.00   metFORMIN (GLUCOPHAGE) 500 MG tablet TAKE 1/2 TABLET BY MOUTH DAILY WITH A MEAL   rosuvastatin (CRESTOR) 10 MG tablet Take 1 tablet (10 mg total) by mouth daily.   sacubitril-valsartan (ENTRESTO) 97-103 MG Take 1 tablet by mouth 2 (two)  times daily.   traMADol (ULTRAM) 50 MG tablet  Take 1 tablet (50 mg total) by mouth 3 (three) times daily as needed. (Patient taking differently: Take 50 mg by mouth daily. 2 tablets daily. M W F patient takes three tablets.)     Review of Systems      All other systems reviewed and are otherwise negative except as noted above.  Physical Exam    VS:  BP (!) 142/74   Pulse 71   Ht _0  (1.575 m)   Wt 168 lb (76.2 kg)   BMI 30.73 kg/m  , BMI Body mass index is 30.73 kg/m.  Wt Readings from Last 3 Encounters:  06/17/22 168 lb (76.2 kg)  06/09/22 168 lb 9.6 oz (76.5 kg)  05/12/22 167 lb 6.4 oz (75.9 kg)    GEN: Well nourished, well developed, in no acute distress. HEENT: normal. Neck: Supple, no JVD, carotid bruits, or masses. Cardiac: ***RRR, no murmurs, rubs, or gallops. No clubbing, cyanosis, edema.  ***Radials/PT 2+ and equal bilaterally.  Respiratory:  ***Respirations regular and unlabored, clear to auscultation bilaterally. GI: Soft, nontender, nondistended. MS: No deformity or atrophy. Skin: Warm and dry, no rash. Neuro:  Strength and sensation are intact. Psych: Normal affect.  Assessment & Plan    Dilated cardiomyopathy / HFpEF / bronchitis - *** PVC - ***  Disposition: Follow up {follow up:15908} with Glenetta Hew, MD or APP.  Signed, Loel Dubonnet, NP 06/17/2022, 9:11 AM [place]

## 2022-06-17 NOTE — Patient Instructions (Signed)
Medication Instructions:   Continue your Amiodarone half tablet every other day - take on Tuesdays, Thursdays, Saturday  Continue your Lasix one tablet daily.   *If you need a refill on your cardiac medications before your next appointment, please call your pharmacy*   Lab Work: Your physician recommends that you return for lab work today: BMP, BNP  If you have labs (blood work) drawn today and your tests are completely normal, you will receive your results only by: MyChart Message (if you have MyChart) OR A paper copy in the mail If you have any lab test that is abnormal or we need to change your treatment, we will call you to review the results.   Testing/Procedures: Your EKG today showed normal sinus rhythm with occasional PVC (early beat in the bottom chambers of your heart) - this is a stable finding.    Follow-Up: At [facility], you and your health needs are our priority.  As part of our continuing mission to provide you with exceptional heart care, we have created designated Provider Care Teams.  These Care Teams include your primary Cardiologist (physician) and Advanced Practice Providers (APPs -  Physician Assistants and Nurse Practitioners) who all work together to provide you with the care you need, when you need it.  We recommend signing up for the patient portal called "MyChart".  Sign up information is provided on this After Visit Summary.  MyChart is used to connect with patients for Virtual Visits (Telemedicine).  Patients are able to view lab/test results, encounter notes, upcoming appointments, etc.  Non-urgent messages can be sent to your provider as well.   To learn more about what you can do with MyChart, go to [email or web address].    Your next appointment:   1 month(s)  The format for your next appointment:   In Person  Provider:   Bryan Lemma, MD or Alver Sorrow, NP     Other Instructions  Heart Healthy Diet Recommendations: A low-salt  diet is recommended. Meats should be grilled, baked, or boiled. Avoid fried foods. Focus on lean protein sources like fish or chicken with vegetables and fruits. The American Heart Association is a Chief Technology Officer!  American Heart Association Diet and Lifeystyle Recommendations  Drink less than 2 liters (64 oz) of fluid per day.   Exercise recommendations: Resume your walking regimen.  You can start small by walking 2 laps three times per week, then 4 laps three times per week, then 6 laps three times per week at [facility]. Our goal is a total of 150 minutes of exercise per week.  Okay to use machines at the [facility] but also keep walking - try walking half the days and using machines the other days you are there.   Recommend weighing daily and keeping a log. Please call our office if you have weight gain of 2 pounds overnight or 5 pounds in 1 week.   Date  Time Weight

## 2022-06-18 ENCOUNTER — Encounter: Payer: Self-pay | Admitting: Family Medicine

## 2022-06-18 LAB — BASIC METABOLIC PANEL
BUN/Creatinine Ratio: 15 (ref 12–28)
BUN: 18 mg/dL (ref 8–27)
CO2: 24 mmol/L (ref 20–29)
Calcium: 9.8 mg/dL (ref 8.7–10.3)
Chloride: 102 mmol/L (ref 96–106)
Creatinine, Ser: 1.24 mg/dL — ABNORMAL HIGH (ref 0.57–1.00)
Glucose: 112 mg/dL — ABNORMAL HIGH (ref 70–99)
Potassium: 4.4 mmol/L (ref 3.5–5.2)
Sodium: 147 mmol/L — ABNORMAL HIGH (ref 134–144)
eGFR: 44 mL/min/{1.73_m2} — ABNORMAL LOW (ref >59)

## 2022-06-18 LAB — BRAIN NATRIURETIC PEPTIDE: BNP: 1397.5 pg/mL — ABNORMAL HIGH (ref 0.0–100.0)

## 2022-06-19 ENCOUNTER — Encounter (HOSPITAL_BASED_OUTPATIENT_CLINIC_OR_DEPARTMENT_OTHER): Payer: Self-pay | Admitting: Family

## 2022-06-20 ENCOUNTER — Telehealth (HOSPITAL_BASED_OUTPATIENT_CLINIC_OR_DEPARTMENT_OTHER): Payer: Self-pay

## 2022-06-20 DIAGNOSIS — I5032 Chronic diastolic (congestive) heart failure: Secondary | ICD-10-CM

## 2022-06-20 MED ORDER — SPIRONOLACTONE 25 MG PO TABS: 12.5000 mg | ORAL_TABLET | Freq: Every day | ORAL | 3 refills | Status: DC

## 2022-06-20 NOTE — Telephone Encounter (Addendum)
Left message for patient to call back      ----- Message from Alver Sorrow, NP sent at 06/18/2022  5:36 PM EDT ----- Stable kidney function. Sodium mildly elevated. BNP shows volume overload but is overall stable compared to prior. Start Spironolactone 12.5mg  QD. Continue Lasix 20mg  daily. She should take an additional 20mg  in the morning for weight gain of 2 lbs overnight or 5 lbs in one week. BMP in 1 week for monitoring.

## 2022-06-20 NOTE — Telephone Encounter (Signed)
Was stopped in the past due to elevated potassium levels.  Potassium levels have normalized and not been high in some time.  Would be reasonable to trial again.  This is why we are starting on low-dose of 12.5 mg daily and we are repeating labs in 1 week which will reassess potassium level.  Would avoid potassium supplement, salt substitute, electrolyte drinks.  Alver Sorrow, NP

## 2022-06-20 NOTE — Telephone Encounter (Addendum)
Results called to patient who verbalizes understanding! Labs ordered and mailed, prescriptions updated and sent to pharmacy on file .    Patient states she was previously on Spironolactone and was stopped, but isn't sure why.   Routing to C. Dan Humphreys, NP as an update   ----- Message from Alver Sorrow, NP sent at 06/18/2022  5:36 PM EDT ----- Stable kidney function. Sodium mildly elevated. BNP shows volume overload but is overall stable compared to prior. Start Spironolactone 12.5mg  QD. Continue Lasix 20mg  daily. She should take an additional 20mg  in the morning for weight gain of 2 lbs overnight or 5 lbs in one week. BMP in 1 week for monitoring.

## 2022-06-20 NOTE — Telephone Encounter (Signed)
RN attempted to call patient back, no answer, left message with the following recommendations.        "Was stopped in the past due to elevated potassium levels.  Potassium levels have normalized and not been high in some time.  Would be reasonable to trial again.  This is why we are starting on low-dose of 12.5 mg daily and we are repeating labs in 1 week which will reassess potassium level.  Would avoid potassium supplement, salt substitute, electrolyte drinks.   Alver Sorrow, NP "

## 2022-06-21 ENCOUNTER — Telehealth: Payer: Self-pay | Admitting: Family

## 2022-06-21 NOTE — Telephone Encounter (Signed)
Patient states she would like to know when she needs to get her lab work done and if she can get it done at the urgent care on [address].

## 2022-06-23 ENCOUNTER — Encounter: Payer: Self-pay | Admitting: Internal Medicine

## 2022-06-23 ENCOUNTER — Other Ambulatory Visit: Payer: Self-pay

## 2022-06-23 DIAGNOSIS — J849 Interstitial pulmonary disease, unspecified: Secondary | ICD-10-CM

## 2022-06-24 NOTE — Telephone Encounter (Signed)
Cardiology has treated for CHF, and with that issue out of the way, they suggested we go ahead with the CT to look for evidence of lung changes caused by amiodarone

## 2022-06-24 NOTE — Telephone Encounter (Signed)
Dr. Maple Hudson, please advise on pt's email. She is questioning why/if she needs a HRCT. It was mentioned in your OV note as a possibility but I did not see where you planned on it. Wanted to double check before informing pt.    OV note from 06/09/22: Diagnoses acute bronchitis at urgent care on [address].  CXR and BNP last February gave evidence of pulmonary vascular congestion.  Recognizing potential symptom overlap, I will recheck those labs although she does not have right-sided failure signs on exam. If she is not fluid overloaded, consider if HRCT would be appropriate looking for fibrosis because of her history of amiodarone therapy. Plan-CXR, BNP

## 2022-06-25 DIAGNOSIS — G4733 Obstructive sleep apnea (adult) (pediatric): Secondary | ICD-10-CM | POA: Diagnosis not present

## 2022-06-28 DIAGNOSIS — I5032 Chronic diastolic (congestive) heart failure: Secondary | ICD-10-CM | POA: Diagnosis not present

## 2022-06-29 ENCOUNTER — Other Ambulatory Visit: Payer: Self-pay | Admitting: Family Medicine

## 2022-06-29 ENCOUNTER — Encounter: Payer: Self-pay | Admitting: Family Medicine

## 2022-06-29 LAB — BASIC METABOLIC PANEL
BUN/Creatinine Ratio: 14 (ref 12–28)
BUN: 21 mg/dL (ref 8–27)
CO2: 29 mmol/L (ref 20–29)
Calcium: 10.2 mg/dL (ref 8.7–10.3)
Chloride: 100 mmol/L (ref 96–106)
Creatinine, Ser: 1.51 mg/dL — ABNORMAL HIGH (ref 0.57–1.00)
Glucose: 112 mg/dL — ABNORMAL HIGH (ref 70–99)
Potassium: 5.8 mmol/L — ABNORMAL HIGH (ref 3.5–5.2)
Sodium: 142 mmol/L (ref 134–144)
eGFR: 35 mL/min/{1.73_m2} — ABNORMAL LOW (ref >59)

## 2022-06-30 ENCOUNTER — Telehealth: Payer: Self-pay | Admitting: Family

## 2022-06-30 DIAGNOSIS — I5032 Chronic diastolic (congestive) heart failure: Secondary | ICD-10-CM

## 2022-06-30 NOTE — Telephone Encounter (Addendum)
Called results to patient and left results on VM (ok per DPR), instructions left to call office back if patient has any questions! Med list updated and labs mailed to patient.     ----- Message from Alver Sorrow, NP sent at 06/29/2022 10:07 AM EDT ----- Agree with recommendations per Dr. Herbie Baltimore. Please inform patient to stop Spironolactone. Increase hydration. Repeat BMP next week.

## 2022-06-30 NOTE — Telephone Encounter (Signed)
Pt returning nurse's call. Please advise

## 2022-06-30 NOTE — Addendum Note (Signed)
Addended by: Marlene Lard on: 06/30/2022 10:24 AM   Modules accepted: Orders

## 2022-07-01 ENCOUNTER — Telehealth: Payer: Self-pay

## 2022-07-01 NOTE — Telephone Encounter (Signed)
Spoke with pt who was confused on why she needed CT scan ordered in last My Chart message. RN reviewed reasoning as dictated by Dr. Maple Hudson. Pt asked when she could expect to get a call regarding the CT  appointment. RN does not have access to that information so pt was instructed that they would get call from [facility] to confirm location, time, and date of CT. Pt stated understanding. Nothing further needed at this time.

## 2022-07-04 ENCOUNTER — Other Ambulatory Visit: Payer: Self-pay | Admitting: Family Medicine

## 2022-07-04 NOTE — Telephone Encounter (Signed)
Sent. Thanks.   

## 2022-07-04 NOTE — Telephone Encounter (Signed)
Refill request for Tramadol 50 mg tablets  LOV - 03/17/22 Next OV - not scheduled Last refill - 12/14/21 #90/5   *Patient is going out of town tomorrow and needs this done today*

## 2022-07-07 ENCOUNTER — Other Ambulatory Visit: Payer: Self-pay | Admitting: Family Medicine

## 2022-07-07 DIAGNOSIS — I5032 Chronic diastolic (congestive) heart failure: Secondary | ICD-10-CM | POA: Diagnosis not present

## 2022-07-07 DIAGNOSIS — B372 Candidiasis of skin and nail: Secondary | ICD-10-CM

## 2022-07-07 MED ORDER — NYSTATIN 100000 UNIT/GM EX CREA: 1.0000 | TOPICAL_CREAM | Freq: Two times a day (BID) | CUTANEOUS | 1 refills | Status: DC

## 2022-07-07 MED ORDER — NYSTATIN 100000 UNIT/GM EX POWD: Freq: Two times a day (BID) | CUTANEOUS | 1 refills | Status: DC

## 2022-07-08 LAB — BASIC METABOLIC PANEL
BUN/Creatinine Ratio: 18 (ref 12–28)
BUN: 24 mg/dL (ref 8–27)
CO2: 25 mmol/L (ref 20–29)
Calcium: 9.9 mg/dL (ref 8.7–10.3)
Chloride: 100 mmol/L (ref 96–106)
Creatinine, Ser: 1.36 mg/dL — ABNORMAL HIGH (ref 0.57–1.00)
Glucose: 170 mg/dL — ABNORMAL HIGH (ref 70–99)
Potassium: 4.5 mmol/L (ref 3.5–5.2)
Sodium: 145 mmol/L — ABNORMAL HIGH (ref 134–144)
eGFR: 39 mL/min/{1.73_m2} — ABNORMAL LOW (ref >59)

## 2022-07-18 ENCOUNTER — Encounter (HOSPITAL_BASED_OUTPATIENT_CLINIC_OR_DEPARTMENT_OTHER): Payer: Self-pay | Admitting: Family

## 2022-07-18 ENCOUNTER — Ambulatory Visit (HOSPITAL_BASED_OUTPATIENT_CLINIC_OR_DEPARTMENT_OTHER): Payer: Medicare Other | Admitting: Family

## 2022-07-18 VITALS — BP 118/60 | HR 67 | Ht 62.0 in | Wt 167.0 lb

## 2022-07-18 DIAGNOSIS — Z79899 Other long term (current) drug therapy: Secondary | ICD-10-CM

## 2022-07-18 DIAGNOSIS — I493 Ventricular premature depolarization: Secondary | ICD-10-CM

## 2022-07-18 DIAGNOSIS — I420 Dilated cardiomyopathy: Secondary | ICD-10-CM

## 2022-07-18 DIAGNOSIS — I5032 Chronic diastolic (congestive) heart failure: Secondary | ICD-10-CM | POA: Diagnosis not present

## 2022-07-18 NOTE — Patient Instructions (Addendum)
Medication Instructions:  Continue your current medications.   You can dispose of Spironolactone.   *If you need a refill on your cardiac medications before your next appointment, please call your pharmacy*   Lab Work: None ordered today.   Your recent kidney function was back to your normal, potassium was normal.    Testing/Procedures: None ordered today.    Follow-Up: At [facility], you and your health needs are our priority.  As part of our continuing mission to provide you with exceptional heart care, we have created designated Provider Care Teams.  These Care Teams include your primary Cardiologist (physician) and Advanced Practice Providers (APPs -  Physician Assistants and Nurse Practitioners) who all work together to provide you with the care you need, when you need it.  We recommend signing up for the patient portal called "MyChart".  Sign up information is provided on this After Visit Summary.  MyChart is used to connect with patients for Virtual Visits (Telemedicine).  Patients are able to view lab/test results, encounter notes, upcoming appointments, etc.  Non-urgent messages can be sent to your provider as well.   To learn more about what you can do with MyChart, go to [email or web address].    Your next appointment:   As scheduled  Other Instructions  Recommend checking BP twice per week at least 2 hours after your medications.   Heart Healthy Diet Recommendations: A low-salt diet is recommended. Meats should be grilled, baked, or boiled. Avoid fried foods. Focus on lean protein sources like fish or chicken with vegetables and fruits. The American Heart Association is a Chief Technology Officer!  American Heart Association Diet and Lifeystyle Recommendations   Exercise recommendations: The American Heart Association recommends 150 minutes of moderate intensity exercise weekly. Try 30 minutes of moderate intensity exercise 4-5 times per week. This could include walking,  jogging, or swimming.  Recommend weighing daily and keeping a log. Please call our office if you have weight gain of 2 pounds overnight or 5 pounds in 1 week.   Date  Time Weight

## 2022-07-18 NOTE — Progress Notes (Signed)
Office Visit    Patient Name: Natasha Woodard Date of Encounter: 07/18/2022  PCP:  Tonia Ghent, MD   [facility]  Cardiologist:  Glenetta Hew, MD  Advanced Practice Provider:  No care team member to display Electrophysiologist:  None      Chief Complaint    Natasha Woodard is a 81 y.o. female presents today for follow up of dyspnea  Past Medical History    Past Medical History:  Diagnosis Date   Calcific tendonitis 2008   Treatment of left leg   Cardiomyopathy ([place]) 06/2017   a) Echo: EF 25-30%. GR 1-2 DD w/ elevated LVEDP. Mild valvular Dz; b) Cardiac MRI 2/'19: frequent PVCs (diffiuclt to interpret) - EF ~27% w/ diffuse HK.  No evidence of infarct, infiltrative Dz or myocarditis. -- ? if related to PVCs. c) f/u Echo 6/'19: EF 35-40%. Gr 1 DD. Diffuse HK.-> d) Jan 2020 EF 50 to 55%.  Mod MAC, mod LAE.  Ao Sclerosis; e) Echo 1/'21 - EF 60-65%, Gr II DD. Mild Mod MR.    Chronic combined systolic and diastolic CHF, NYHA class 2 and ACA/AHA stage C    Now essentially resolved, back to simply diastolic CHF   Coronary artery disease, non-occlusive    mild-moderate CAD 06/30/17 cath   CTS (carpal tunnel syndrome)    Diabetes mellitus    Type II   Dysrhythmia    patient said that she cant remember what it is   Frequent unifocal PVCs 01/2018   Event monitor showed normal sinus rhythm with frequent multifocal PVCs (9%) and PACs.  Nighttime bradycardia suggestive of OSA.   Hiatal hernia    with reflux   Hyperlipidemia    Statin intolerant   Hypertension    Ichthyosis congenita    Intermittent claudication of both lower extremities due to atherosclerosis ([place]) 08/22/2021   LEA Dopplers 09/09/2021:  Right: Total occlusion noted in the superficial femoral artery. Atherosclerosis noted throughout extremity, see note above. Three vessel runoff.  Left: Total occlusion noted in the superficial femoral artery and/or popliteal artery. Total occlusion noted in  the distal anterior tibial artery. Athereosclerosis noted throughout extremity.    NSVD (normal spontaneous vaginal delivery) 1971 & 1972   Obesity    Plantar fasciitis    Left   Pulmonary nodule    Imaged multiple times and benign appearing   Wears glasses    Past Surgical History:  Procedure Laterality Date   ABDOMINAL HYSTERECTOMY  1975   TAH-BSO   ABSCESS DRAINAGE  1972   right breast   BREAST BIOPSY Left 01/14/2009   Stereo- Benign   CARDIAC MRI  01/2018   Difficult to interpret 2/2 PVCs. Normal LV size - EF ~27% with diffuse HK. Mild RV dilation - normal fxn. -- NO MYOCARDIAL LGI => no definitive evidence of prior MI, Infiltrative Dz or Myocarditis -- suspect NICM, possibly related to PVCs.   CARPAL TUNNEL RELEASE Right 07/02/2014   Procedure: RIGHT CARPAL TUNNEL RELEASE;  Surgeon: Wynonia Sours, MD;  Location: [place];  Service: Orthopedics;  Laterality: Right;   CARPAL TUNNEL RELEASE Left 06/11/2015   Procedure: LEFT CARPAL TUNNEL RELEASE;  Surgeon: Daryll Brod, MD;  Location: [place];  Service: Orthopedics;  Laterality: Left;  REGIONAL/FAB   COLONOSCOPY     corn removal  1968   right   Holter Monitor  06/2017   ~17,000 PVC beats - majority were singlets, some couplets.  44 brief 3-7 beat runs of NSVT. Also noted were less frequent PACs with 11 runs (longest 15 beats)   OPEN REDUCTION INTERNAL FIXATION (ORIF) DISTAL RADIAL FRACTURE Left 07/21/2017   Procedure: OPEN REDUCTION INTERNAL FIXATION (ORIF) LEFT DISTAL RADIAL FRACTURE;  Surgeon: Renette Butters, MD;  Location: [place];  Service: Orthopedics;  Laterality: Left;   RIGHT/LEFT HEART CATH AND CORONARY ANGIOGRAPHY N/A 06/30/2017   Procedure: Right/Left Heart Cath and Coronary Angiography;  Surgeon: Leonie Man, MD;  Location: [facility] INVASIVE CV LAB:  pRCA 55%, pCx 40%, OM1 45%, mCx 50%, D2 50% - LVEF 25-35%. Moderately elevated LVEDP(26 mmHg with PCWP 16 mmHg).  FICK CO/CI: 4.47/2.48. PA  pressures 47/14 mmHg with a mean of 27 mm.   ROTATOR CUFF REPAIR     left shoulder   TRANSTHORACIC ECHOCARDIOGRAM  06/2017   EF 25 and 30%. GR 1 DD. Mild diastolic dysfunction with elevated LVEDP. Mild valvular disease.   TRANSTHORACIC ECHOCARDIOGRAM  11'18, 6/'19    a) EF remains 30-35%.  Diffuse hypokinesis noted.  Severe LA dilation; b) Improved EF 35-40%.  Diffuse HK.  GR 1 DD.  Moderate TR.  Mild RV dilation.   TRANSTHORACIC ECHOCARDIOGRAM  1/'20; 1/'21   a) EF 50 to 55%. Gr I DD. (Notabley improved LV Fxn.  Moderate LA dilation.  Mod MAC.  AoV sclerosis w/o stenosis;; b) EF 60 to 65%.  Normal LV size and function.  GRII DD.  Mild to moderate MR.  Normal PA pressures.   ZIO Patch 14 d Event Monitor  10/2018   Results as below < 1% PVC.  (Notably improved after starting amiodarone)    Allergies  Allergies  Allergen Reactions   Ezetimibe-Simvastatin Other (See Comments)    REACTION: Muscle aches (side effect)   Lipitor [Atorvastatin] Other (See Comments)    Leg weakness    Claritin [Loratadine]     Possible cause of nightmares.     Spironolactone     Elevated potassium/creatinine   Tamiflu [Oseltamivir Phosphate] Other (See Comments)    nightmares   Pravastatin Sodium Other (See Comments)    REACTION: Muscle aches (side effect)   Sulfonamide Derivatives Nausea And Vomiting    History of Present Illness    Natasha Woodard is a 81 y.o. female with a hx of dilated cardiomyopathy, PVC on amiodarone therapy, HFpEF, hyperlipidemia, DM 2, PAD last seen 06/17/22.   July 2018 was referred for frequent PVCs and heart failure symptoms.  EF 25-30% at that time with monitor revealing 70% PVC burden.  Right and left heart cath revealed normal coronaries, LVEDP 26 mmHg, PCWP 16 mmHg.  Amiodarone added for PVC suppression.  Subsequent improvement in EF 05/2018 EF 35-40% in December 31 2158-65%.  Graduated from the advanced heart failure clinic 03/2021.  September 2022 underwent LAA Dopplers  revealing severe occlusive PAD evaluated by Dr. Fletcher Anon recommended for medical management and walking routine.  She was seen by pulmonology 01/2022 recommended for sleep study which revealed OSA subsequently started on CPAP.  Updated echo 01/2019 through EF 50 to 40%, grade 2 diastolic dysfunction, LA severely dilated, RA mildly dilated, normal PASP, mild MR, mild to moderate TR.  She called the office 06/03/2022 due to shortness of breath for 3 weeks.  Evuated in urgent care with positional dyspnea provided with nebulizer and Rx for PRN DuoNeb. Saw pulmonology 06/09/2022.  06/11/2022 chest x-ray with no new clusters, bibasilar streaky air base opacities likely atelectasis, no pulmonary edema, no pleural effusion.  BNP 4638. On 06/13/22 she was instructed to stop Amiodarone for 2 days then reduce Amiodarone 118m every other day. She was also instructed Lasix 449mfor 3 days then Lasix 2049maily. At follow up 06/17/22 noted dyspnea was 20% better. She was started on Spironolactone but it had to be discontinued due to hyperkalemia.   She presents today for follow-up independently. Is back to [facility] a few times per week. Her dyspnea is 80% improved and she is feeling much better. Weight at home down to 161 lbs. No chest pain, edema, orthopnea, PND. BP at home 130s-140s prior to her medications checked once per week. No lightheadedness, dizziness.    EKGs/Labs/Other Studies Reviewed:   The following studies were reviewed today:  EKG: No EKG today.   Recent Labs: 01/14/2022: ALT 18; Hemoglobin 13.5; Platelets 287 06/09/2022: NT-Pro BNP 4,638 06/17/2022: BNP 1,397.5 07/07/2022: BUN 24; Creatinine, Ser 1.36; Potassium 4.5; Sodium 145  Recent Lipid Panel    Component Value Date/Time   CHOL 141 03/19/2021 1121   CHOL 139 10/09/2020 0933   TRIG 89.0 03/19/2021 1121   HDL 42.10 03/19/2021 1121   HDL 42 10/09/2020 0933   CHOLHDL 3 03/19/2021 1121   VLDL 17.8 03/19/2021 1121   LDLCALC 81  03/19/2021 1121   LDLCALC 80 10/09/2020 0933   LDLDIRECT 53 08/10/2009 0000    Home Medications   Current Meds  Medication Sig   acetaminophen (TYLENOL) 500 MG tablet Take 500 mg by mouth every 6 (six) hours as needed.   amiodarone (PACERONE) 200 MG tablet Take a half  tablet Tuesday, Thursday and Saturday in the morning.   aspirin 81 MG tablet Take 1 tablet (81 mg total) by mouth daily.   carvedilol (COREG) 25 MG tablet TAKE 1 TABLET BY MOUTH TWICE DAILY   Cholecalciferol (VITAMIN D) 1000 UNITS capsule Take 1,000 Units by mouth daily.   furosemide (LASIX) 20 MG tablet Take 1 tablet (20 mg total) by mouth daily.   glucose blood (ACCU-CHEK AVIVA PLUS) test strip USE TO TEST BLOOD SUGAR ONCE DAILY FOR DM E11.9   Ipratropium-Albuterol (COMBIVENT RESPIMAT) 20-100 MCG/ACT AERS respimat Inhale 1 puff into the lungs every 6 (six) hours as needed for wheezing or shortness of breath.   JARDIANCE 10 MG TABS tablet TAKE 1/2 TABLET(5 MG) BY MOUTH DAILY   Lancets (ACCU-CHEK MULTICLIX) lancets USE ONE LANCET TO TEST BLOOD GLUCOSE DAILY FOR DM 250.00   metFORMIN (GLUCOPHAGE) 500 MG tablet TAKE 1/2 TABLET BY MOUTH DAILY WITH A MEAL   nystatin (MYCOSTATIN/NYSTOP) powder Apply topically 2 (two) times daily.   nystatin cream (MYCOSTATIN) Apply 1 Application topically 2 (two) times daily.   rosuvastatin (CRESTOR) 10 MG tablet Take 1 tablet (10 mg total) by mouth daily.   sacubitril-valsartan (ENTRESTO) 97-103 MG Take 1 tablet by mouth 2 (two) times daily.   traMADol (ULTRAM) 50 MG tablet TAKE 1 TABLET(50 MG) BY MOUTH THREE TIMES DAILY AS NEEDED     Review of Systems      All other systems reviewed and are otherwise negative except as noted above.  Physical Exam    VS:  BP 92/60 Comment: right arm  Pulse 67   Ht _0  (1.575 m)   Wt 167 lb (75.8 kg)   BMI 30.54 kg/m  , BMI Body mass index is 30.54 kg/m.  Wt Readings from Last 3 Encounters:  07/18/22 167 lb (75.8 kg)  06/17/22 168 lb (76.2 kg)   06/09/22 168  lb 9.6 oz (76.5 kg)    GEN: Well nourished, well developed, in no acute distress. HEENT: normal. Neck: Supple, no JVD, carotid bruits, or masses. Cardiac: RRR, no murmurs, rubs, or gallops. No clubbing, cyanosis, edema.  Radials/PT 2+ and equal bilaterally.  Respiratory:  Respirations regular and unlabored, clear to auscultation bilaterally. GI: Soft, nontender, nondistended. MS: No deformity or atrophy. Skin: Warm and dry, no rash. Neuro:  Strength and sensation are intact. Psych: Normal affect.  Assessment & Plan    Dilated cardiomyopathy / HFpEF / bronchitis - Breathing much improved since last OV. Anticipate some element of chronic elevation of BNP given longstanding HF. Approx dry weight 165lbs by home scale. Continue GMDT Entresto, Carvedilol, Lasix, Jardiance. No Spironolactone due to intolerance with hyperkalemia. Low sodium diet, fluid restriction <2L, and daily weights encouraged. Educated to contact our office for weight gain of 2 lbs overnight or 5 lbs in one week.    PVC / On Amiodarone therapy -  Known finding contributing to cardiomyopathy. Reports only rare palpitations. Continue Amiodarone 123m QOD. Breathing feels 80% better per her report. CT upcoming with pulmonology for monitoring of lungs on Amiodarone. However, as dyspnea much improved low suspicion Amiodarone contributory.   Obesity - Weight loss via diet and exercise encouraged. Discussed the impact being overweight would have on cardiovascular risk. [facility] and planning to start water aerobics.   PAD - Follows with Dr. AFletcher Anon Upcoming visit. No new claudication.   Disposition: Follow up as scheduled with DGlenetta Hew MD or APP.  Signed, CLoel Dubonnet NP 07/18/2022, 3:48 PM  [facility]

## 2022-07-22 ENCOUNTER — Encounter (HOSPITAL_BASED_OUTPATIENT_CLINIC_OR_DEPARTMENT_OTHER): Payer: Self-pay | Admitting: Family

## 2022-07-26 DIAGNOSIS — G4733 Obstructive sleep apnea (adult) (pediatric): Secondary | ICD-10-CM | POA: Diagnosis not present

## 2022-07-29 ENCOUNTER — Ambulatory Visit
Admission: RE | Admit: 2022-07-29 | Discharge: 2022-07-29 | Disposition: A | Discharge status: Home / Self Care | Payer: Medicare Other | Source: Ambulatory Visit | Attending: Internal Medicine | Admitting: Internal Medicine

## 2022-07-29 DIAGNOSIS — J929 Pleural plaque without asbestos: Secondary | ICD-10-CM | POA: Diagnosis not present

## 2022-07-29 DIAGNOSIS — J849 Interstitial pulmonary disease, unspecified: Secondary | ICD-10-CM

## 2022-07-29 DIAGNOSIS — J432 Centrilobular emphysema: Secondary | ICD-10-CM | POA: Diagnosis not present

## 2022-07-29 DIAGNOSIS — J479 Bronchiectasis, uncomplicated: Secondary | ICD-10-CM | POA: Diagnosis not present

## 2022-07-29 DIAGNOSIS — I700 Atherosclerosis of aorta: Secondary | ICD-10-CM | POA: Diagnosis not present

## 2022-08-02 ENCOUNTER — Ambulatory Visit: Payer: Medicare Other | Admitting: Cardiovascular Disease

## 2022-08-02 ENCOUNTER — Encounter: Payer: Self-pay | Admitting: Cardiovascular Disease

## 2022-08-02 VITALS — BP 124/88 | HR 68 | Ht 62.0 in | Wt 161.2 lb

## 2022-08-02 DIAGNOSIS — I739 Peripheral vascular disease, unspecified: Secondary | ICD-10-CM | POA: Diagnosis not present

## 2022-08-02 DIAGNOSIS — I5022 Chronic systolic (congestive) heart failure: Secondary | ICD-10-CM

## 2022-08-02 DIAGNOSIS — I493 Ventricular premature depolarization: Secondary | ICD-10-CM | POA: Diagnosis not present

## 2022-08-02 DIAGNOSIS — E785 Hyperlipidemia, unspecified: Secondary | ICD-10-CM

## 2022-08-02 NOTE — Patient Instructions (Signed)
Medication Instructions:  NO CHANGES  *If you need a refill on your cardiac medications before your next appointment, please call your pharmacy*    Follow-Up: At [facility], you and your health needs are our priority.  As part of our continuing mission to provide you with exceptional heart care, we have created designated Provider Care Teams.  These Care Teams include your primary Cardiologist (physician) and Advanced Practice Providers (APPs -  Physician Assistants and Nurse Practitioners) who all work together to provide you with the care you need, when you need it.  We recommend signing up for the patient portal called "MyChart".  Sign up information is provided on this After Visit Summary.  MyChart is used to connect with patients for Virtual Visits (Telemedicine).  Patients are able to view lab/test results, encounter notes, upcoming appointments, etc.  Non-urgent messages can be sent to your provider as well.   To learn more about what you can do with MyChart, go to [email or web address].    Your next appointment:   12 month(s)  The format for your next appointment:   In Person  Provider:   Dr. Arida     

## 2022-08-02 NOTE — Progress Notes (Signed)
Cardiology Office Note   Date:  08/02/2022   ID:  [name], [name] 08/01/1941, MRN [id number]  PCP:  Tonia Ghent, MD  Cardiologist:  Dr. Ellyn Hack  No chief complaint on file.     History of Present Illness: Natasha Woodard is a 81 y.o. female who is here today for follow-up visit regarding peripheral arterial disease.   She has known history of chronic systolic heart failure due to nonischemic cardiomyopathy with normalization of LV systolic function, PVCs, mild to moderate nonobstructive coronary artery disease, diabetes mellitus, essential hypertension, stage III chronic kidney disease, hyperlipidemia with statin intolerance and  peripheral arterial disease.  She quit smoking more than 30 years ago.  She is followed for  moderate bilateral calf claudication due to bilateral SFA occlusion with moderately reduced ABI.  I recommended starting an exercise program.  She had issues with shortness of breath that has improved since her last visit and thus she has been able to do more walking and reports stable bilateral claudication.  No rest pain or lower extremity ulceration.  She is trying to walk without a cane.   Past Medical History:  Diagnosis Date   Calcific tendonitis 2008   Treatment of left leg   Cardiomyopathy ([place]) 06/2017   a) Echo: EF 25-30%. GR 1-2 DD w/ elevated LVEDP. Mild valvular Dz; b) Cardiac MRI 2/'19: frequent PVCs (diffiuclt to interpret) - EF ~27% w/ diffuse HK.  No evidence of infarct, infiltrative Dz or myocarditis. -- ? if related to PVCs. c) f/u Echo 6/'19: EF 35-40%. Gr 1 DD. Diffuse HK.-> d) Jan 2020 EF 50 to 55%.  Mod MAC, mod LAE.  Ao Sclerosis; e) Echo 1/'21 - EF 60-65%, Gr II DD. Mild Mod MR.    Chronic combined systolic and diastolic CHF, NYHA class 2 and ACA/AHA stage C    Now essentially resolved, back to simply diastolic CHF   Coronary artery disease, non-occlusive    mild-moderate CAD 06/30/17 cath   CTS (carpal tunnel syndrome)     Diabetes mellitus    Type II   Dysrhythmia    patient said that she cant remember what it is   Frequent unifocal PVCs 01/2018   Event monitor showed normal sinus rhythm with frequent multifocal PVCs (9%) and PACs.  Nighttime bradycardia suggestive of OSA.   Hiatal hernia    with reflux   Hyperlipidemia    Statin intolerant   Hypertension    Ichthyosis congenita    Intermittent claudication of both lower extremities due to atherosclerosis ([place]) 08/22/2021   LEA Dopplers 09/09/2021:  Right: Total occlusion noted in the superficial femoral artery. Atherosclerosis noted throughout extremity, see note above. Three vessel runoff.  Left: Total occlusion noted in the superficial femoral artery and/or popliteal artery. Total occlusion noted in the distal anterior tibial artery. Athereosclerosis noted throughout extremity.    NSVD (normal spontaneous vaginal delivery) 1971 & 1972   Obesity    Plantar fasciitis    Left   Pulmonary nodule    Imaged multiple times and benign appearing   Wears glasses     Past Surgical History:  Procedure Laterality Date   ABDOMINAL HYSTERECTOMY  1975   TAH-BSO   ABSCESS DRAINAGE  1972   right breast   BREAST BIOPSY Left 01/14/2009   Stereo- Benign   CARDIAC MRI  01/2018   Difficult to interpret 2/2 PVCs. Normal LV size - EF ~27% with diffuse HK. Mild RV dilation - normal fxn. --  NO MYOCARDIAL LGI => no definitive evidence of prior MI, Infiltrative Dz or Myocarditis -- suspect NICM, possibly related to PVCs.   CARPAL TUNNEL RELEASE Right 07/02/2014   Procedure: RIGHT CARPAL TUNNEL RELEASE;  Surgeon: Wynonia Sours, MD;  Location: [facility];  Service: Orthopedics;  Laterality: Right;   CARPAL TUNNEL RELEASE Left 06/11/2015   Procedure: LEFT CARPAL TUNNEL RELEASE;  Surgeon: Daryll Brod, MD;  Location: [facility];  Service: Orthopedics;  Laterality: Left;  REGIONAL/FAB   COLONOSCOPY     corn removal  1968   right   Holter Monitor   06/2017   ~17,000 PVC beats - majority were singlets, some couplets. 44 brief 3-7 beat runs of NSVT. Also noted were less frequent PACs with 11 runs (longest 15 beats)   OPEN REDUCTION INTERNAL FIXATION (ORIF) DISTAL RADIAL FRACTURE Left 07/21/2017   Procedure: OPEN REDUCTION INTERNAL FIXATION (ORIF) LEFT DISTAL RADIAL FRACTURE;  Surgeon: Renette Butters, MD;  Location: [facility];  Service: Orthopedics;  Laterality: Left;   RIGHT/LEFT HEART CATH AND CORONARY ANGIOGRAPHY N/A 06/30/2017   Procedure: Right/Left Heart Cath and Coronary Angiography;  Surgeon: Leonie Man, MD;  Location: [facility] INVASIVE CV LAB:  pRCA 55%, pCx 40%, OM1 45%, mCx 50%, D2 50% - LVEF 25-35%. Moderately elevated LVEDP(26 mmHg with PCWP 16 mmHg).  FICK CO/CI: 4.47/2.48. PA pressures 47/14 mmHg with a mean of 27 mm.   ROTATOR CUFF REPAIR     left shoulder   TRANSTHORACIC ECHOCARDIOGRAM  06/2017   EF 25 and 30%. GR 1 DD. Mild diastolic dysfunction with elevated LVEDP. Mild valvular disease.   TRANSTHORACIC ECHOCARDIOGRAM  11'18, 6/'19    a) EF remains 30-35%.  Diffuse hypokinesis noted.  Severe LA dilation; b) Improved EF 35-40%.  Diffuse HK.  GR 1 DD.  Moderate TR.  Mild RV dilation.   TRANSTHORACIC ECHOCARDIOGRAM  1/'20; 1/'21   a) EF 50 to 55%. Gr I DD. (Notabley improved LV Fxn.  Moderate LA dilation.  Mod MAC.  AoV sclerosis w/o stenosis;; b) EF 60 to 65%.  Normal LV size and function.  GRII DD.  Mild to moderate MR.  Normal PA pressures.   ZIO Patch 14 d Event Monitor  10/2018   Results as below < 1% PVC.  (Notably improved after starting amiodarone)     Current Outpatient Medications  Medication Sig Dispense Refill   acetaminophen (TYLENOL) 500 MG tablet Take 500 mg by mouth every 6 (six) hours as needed.     amiodarone (PACERONE) 200 MG tablet Take a half  tablet Tuesday, Thursday and Saturday in the morning. 36 tablet 3   aspirin 81 MG tablet Take 1 tablet (81 mg total) by mouth daily.     carvedilol (COREG) 25 MG  tablet TAKE 1 TABLET BY MOUTH TWICE DAILY 180 tablet 1   Cholecalciferol (VITAMIN D) 1000 UNITS capsule Take 1,000 Units by mouth daily.     furosemide (LASIX) 20 MG tablet Take 1 tablet (20 mg total) by mouth daily. 90 tablet 3   glucose blood (ACCU-CHEK AVIVA PLUS) test strip USE TO TEST BLOOD SUGAR ONCE DAILY FOR DM E11.9 100 each 1   Ipratropium-Albuterol (COMBIVENT RESPIMAT) 20-100 MCG/ACT AERS respimat Inhale 1 puff into the lungs every 6 (six) hours as needed for wheezing or shortness of breath. 4 g 0   JARDIANCE 10 MG TABS tablet TAKE 1/2 TABLET(5 MG) BY MOUTH DAILY 15 tablet 6   Lancets (ACCU-CHEK MULTICLIX) lancets USE ONE  LANCET TO TEST BLOOD GLUCOSE DAILY FOR DM 250.00 102 each 3   metFORMIN (GLUCOPHAGE) 500 MG tablet TAKE 1/2 TABLET BY MOUTH DAILY WITH A MEAL     nystatin (MYCOSTATIN/NYSTOP) powder Apply topically 2 (two) times daily. 30 g 1   nystatin cream (MYCOSTATIN) Apply 1 Application topically 2 (two) times daily. 30 g 1   rosuvastatin (CRESTOR) 10 MG tablet Take 1 tablet (10 mg total) by mouth daily. 90 tablet 3   sacubitril-valsartan (ENTRESTO) 97-103 MG Take 1 tablet by mouth 2 (two) times daily. 14 tablet 0   traMADol (ULTRAM) 50 MG tablet TAKE 1 TABLET(50 MG) BY MOUTH THREE TIMES DAILY AS NEEDED 90 tablet 5   No current facility-administered medications for this visit.    Allergies:   Ezetimibe-simvastatin, Lipitor [atorvastatin], Claritin [loratadine], Spironolactone, Tamiflu [oseltamivir phosphate], Pravastatin sodium, and Sulfonamide derivatives    Social History:  The patient  reports that she quit smoking about 28 years ago. Her smoking use included cigarettes. She has never used smokeless tobacco. She reports that she does not drink alcohol and does not use drugs.   Family History:  The patient's family history includes Cancer in her brother; Diabetes in her father and mother; Heart disease in her brother, father, and mother; Hypertension in an other family  member.    ROS:  Please see the history of present illness.   Otherwise, review of systems are positive for none.   All other systems are reviewed and negative.    PHYSICAL EXAM: VS:  There were no vitals taken for this visit. , BMI There is no height or weight on file to calculate BMI. GEN: Well nourished, well developed, in no acute distress  HEENT: normal  Neck: no JVD, carotid bruits, or masses Cardiac: RRR; no murmurs, rubs, or gallops,no edema  Respiratory:  clear to auscultation bilaterally, normal work of breathing GI: soft, nontender, nondistended, + BS MS: no deformity or atrophy  Skin: warm and dry, no rash Neuro:  Strength and sensation are intact Psych: euthymic mood, full affect Vascular: Femoral pulse: +2 bilaterally.  Distal pulses are not palpable.   EKG:  EKG is not ordered today.    Recent Labs: 01/14/2022: ALT 18; Hemoglobin 13.5; Platelets 287 06/09/2022: NT-Pro BNP 4,638 06/17/2022: BNP 1,397.5 07/07/2022: BUN 24; Creatinine, Ser 1.36; Potassium 4.5; Sodium 145    Lipid Panel    Component Value Date/Time   CHOL 141 03/19/2021 1121   CHOL 139 10/09/2020 0933   TRIG 89.0 03/19/2021 1121   HDL 42.10 03/19/2021 1121   HDL 42 10/09/2020 0933   CHOLHDL 3 03/19/2021 1121   VLDL 17.8 03/19/2021 1121   LDLCALC 81 03/19/2021 1121   LDLCALC 80 10/09/2020 0933   LDLDIRECT 53 08/10/2009 0000      Wt Readings from Last 3 Encounters:  07/18/22 167 lb (75.8 kg)  06/17/22 168 lb (76.2 kg)  06/09/22 168 lb 9.6 oz (76.5 kg)           No data to display            ASSESSMENT AND PLAN:  1.  Peripheral arterial disease: Moderate to severe bilateral calf claudication due to SFA occlusion bilaterally.  Her symptoms are not lifestyle limiting at the present time. She had improvement in symptoms with a walking program which should be continued.  Angiography can be considered for worsening symptoms.  2.  PVCs: Seems to be well suppressed with low-dose  amiodarone and carvedilol.  3.  History of  chronic systolic heart failure: Seems to be well compensated and currently on optimal medical therapy.  4.  Hyperlipidemia: Currently on lovastatin with intolerance to other statins.  Most recent lipid profile showed an LDL of 80.    Disposition:   FU with me in 12 months  Signed,  Kathlyn Sacramento, MD  08/02/2022 10:51 AM    [place]

## 2022-08-05 DIAGNOSIS — H6123 Impacted cerumen, bilateral: Secondary | ICD-10-CM | POA: Diagnosis not present

## 2022-08-12 ENCOUNTER — Other Ambulatory Visit (HOSPITAL_COMMUNITY): Payer: Self-pay | Admitting: Cardiology

## 2022-08-12 ENCOUNTER — Other Ambulatory Visit: Payer: Self-pay | Admitting: Family Medicine

## 2022-08-12 HISTORY — PX: OTHER SURGICAL HISTORY: SHX169

## 2022-08-16 ENCOUNTER — Encounter (HOSPITAL_BASED_OUTPATIENT_CLINIC_OR_DEPARTMENT_OTHER): Payer: Self-pay

## 2022-08-17 ENCOUNTER — Encounter: Payer: Self-pay | Admitting: Cardiology

## 2022-08-17 NOTE — Progress Notes (Signed)
Received call this am from pt reference concerns that her BP machine wasn't picking up her pulse correctly reading too low in the 40's.  She was on there way to the [facility] I asked her to come by my office.  Checked pulse with pulse ox machine pulse ranged 38 to 75-left on finger while we talked for a few min. Denies syncope, lightheadedness, nausea. Just finished walking on track.  Patient is on a number of meds all of which she knew. Used her machine and BP was 127/83 pulse reading 43 Asked that pt f/u with MD and I would send a note to MD.  May need some meds adjusted. Patient said she would.

## 2022-08-17 NOTE — Telephone Encounter (Signed)
NO need to be concerned about HR 47 as long as not dizzy.   We may need to adjust medications a bit @ f/u, but no need to come early.  DH

## 2022-08-19 ENCOUNTER — Telehealth: Payer: Self-pay | Admitting: Cardiology

## 2022-08-19 NOTE — Telephone Encounter (Signed)
Spoke to Dr Herbie Baltimore, keep taking medication. Continue current medication Amiodarone and carvedilol. As long as she is not having in symptoms of dizziness  or fatigue.   RN informed patient of Dr Herbie Baltimore recommendation .  Keep appointment 09/02/22.   Patient voiced understanding.

## 2022-08-19 NOTE — Telephone Encounter (Signed)
Spoke with pt, she reports that she feels fine. She is just concerned about it being so low. She is taking amiodarone 3 days a week and carvedilol. Aware will forward to dr harding to make him aware.

## 2022-08-19 NOTE — Telephone Encounter (Signed)
STAT if HR is under 50 or over 120 (normal HR is 60-100 beats per minute)  What is your heart rate? 35  Do you have a log of your heart rate readings (document readings)? Yes  Do you have any other symptoms?  No  Patient called to return RN Sharon's text and report that her HR has been falling.

## 2022-08-22 NOTE — Telephone Encounter (Signed)
Late entry spoke to patient on 08/19/22.  See other telephone call note from 08/19/22

## 2022-08-26 DIAGNOSIS — G4733 Obstructive sleep apnea (adult) (pediatric): Secondary | ICD-10-CM | POA: Diagnosis not present

## 2022-08-31 ENCOUNTER — Ambulatory Visit (INDEPENDENT_AMBULATORY_CARE_PROVIDER_SITE_OTHER): Payer: Medicare Other

## 2022-08-31 ENCOUNTER — Ambulatory Visit: Payer: Medicare Other | Attending: Cardiology | Admitting: Cardiology

## 2022-08-31 ENCOUNTER — Encounter: Payer: Self-pay | Admitting: Cardiology

## 2022-08-31 VITALS — BP 118/66 | HR 69 | Ht 62.0 in | Wt 155.8 lb

## 2022-08-31 DIAGNOSIS — R001 Bradycardia, unspecified: Secondary | ICD-10-CM

## 2022-08-31 DIAGNOSIS — I420 Dilated cardiomyopathy: Secondary | ICD-10-CM | POA: Diagnosis not present

## 2022-08-31 DIAGNOSIS — E1169 Type 2 diabetes mellitus with other specified complication: Secondary | ICD-10-CM

## 2022-08-31 DIAGNOSIS — Z79899 Other long term (current) drug therapy: Secondary | ICD-10-CM | POA: Diagnosis not present

## 2022-08-31 DIAGNOSIS — E785 Hyperlipidemia, unspecified: Secondary | ICD-10-CM | POA: Diagnosis not present

## 2022-08-31 DIAGNOSIS — I493 Ventricular premature depolarization: Secondary | ICD-10-CM | POA: Diagnosis not present

## 2022-08-31 DIAGNOSIS — I5032 Chronic diastolic (congestive) heart failure: Secondary | ICD-10-CM

## 2022-08-31 DIAGNOSIS — N183 Chronic kidney disease, stage 3 unspecified: Secondary | ICD-10-CM | POA: Diagnosis not present

## 2022-08-31 DIAGNOSIS — I10 Essential (primary) hypertension: Secondary | ICD-10-CM | POA: Diagnosis not present

## 2022-08-31 NOTE — Progress Notes (Signed)
Primary Care Provider: Tonia Ghent, MD [place] Cardiologist: Glenetta Hew, MD [facility] Cardiologist: Dr. Kathlyn [place] Electrophysiologist: None Pulmonologist: Dr. Annamaria Boots  She follows up today at the request of Dr. Elsie Stain  Clinic Note: Chief Complaint  Patient presents with   Follow-up    6-week.  Doing better.  Weight is down.   Congestive Heart Failure    Chronic HFpEF -> feeling much better.   Bradycardia    Concerns of bradycardia and patient went to urgent care and told not to take amiodarone.   ===================================  ASSESSMENT/PLAN   Problem List Items Addressed This Visit       Cardiology Problems   Chronic heart failure with preserved ejection fraction (HFpEF) (HCC) - Primary (Chronic)    Intermittent episodes of Class IIb-III CHF but usually stable Class Ib-IIa.  We are now at the all-time low weight for her, 155.  She seems to be feeling a whole lot better.  Part of it is that she has lost weight but also part has been fluid weight.  When I last saw her her dry weight at home was 170, now elevated at 155. I spent another 15+ minutes talking to her about sliding scale Lasix.  Plan: Continue for now current dose of carvedilol, Entresto and Jardiance. Also continue with standing dose of torsemide 20 mg daily.  But we did again mention the sliding scale concept of taking an additional dose daily until weight back down to normal for weight gains greater than 3 pounds in 1 day or 5 pounds in 1 week. New dry weight now 155 pounds at home.  We will check a new BNP level (I imagine her baseline BNP will be elevated because of the dilated left atrium.  Just need to see what her new baseline is when she is stable.  Not on spironolactone because of issues with hyperkalemia.  Attempted restarting it led again to hyperkalemia.      Relevant Orders   EKG 12-Lead   LONG TERM MONITOR (3-14 DAYS)   Thyroid Function Panel (THS+T3+T4+Free)  (Completed)   Sedimentation rate (Completed)   C-reactive protein (Completed)   Brain natriuretic peptide (Completed)   Dilated cardiomyopathy (HCC) (Chronic)    Essentially resolved with the suppression of PVCs and initiation of guideline directed medical therapy.  Most recent echocardiogram showed EF of 50 to 55%.  There is residual grade 2 diastolic function and severe left atrial dilation.  Would like to continue to suppress PVCs but use as little amiodarone is necessary.  For now continue 100 mg amiodarone 3 days a week along with carvedilol. Continue Entresto and Jardiance      Relevant Orders   EKG 12-Lead   LONG TERM MONITOR (3-14 DAYS)   Thyroid Function Panel (THS+T3+T4+Free) (Completed)   Sedimentation rate (Completed)   C-reactive protein (Completed)   Brain natriuretic peptide (Completed)   Frequent PVCs (Chronic)   Relevant Orders   EKG 12-Lead   LONG TERM MONITOR (3-14 DAYS)   Thyroid Function Panel (THS+T3+T4+Free) (Completed)   Sedimentation rate (Completed)   C-reactive protein (Completed)   Brain natriuretic peptide (Completed)   Hyperlipidemia associated with type 2 diabetes mellitus (HCC) (Chronic)    She was not fasting today.  She should have fasting lipid panel done this year.  Not having any done since April 2022.  She is taking rosuvastatin.  Need to follow these LFTs and lipids.  Would recommend this to be drawn when she sees her PCP  next.      Essential hypertension (Chronic)    BP well controlled on current dose of carvedilol and Entresto.      Relevant Orders   EKG 12-Lead   LONG TERM MONITOR (3-14 DAYS)   Thyroid Function Panel (THS+T3+T4+Free) (Completed)   Sedimentation rate (Completed)   C-reactive protein (Completed)   Brain natriuretic peptide (Completed)     Other   Bradycardia on ECG    Currently she is no longer bradycardic, but has been shown to have low heart rates in the past several months reportedly at least into the 40s.  We  have backed off on her amiodarone, but are staying steady with the carvedilol dose.  I would like for her to wear a monitor for at least 3 days to see if we can evaluate what her heart rate trends are at home.  She would not be able to check her heart rates on her own.  Of asked that she check her heart rate when she is walking and she does not know how she would we will do that.  The monitor will help [place] to evaluate heart rate trends but also chronotropic competence.      Relevant Orders   EKG 12-Lead   LONG TERM MONITOR (3-14 DAYS)   Thyroid Function Panel (THS+T3+T4+Free) (Completed)   Sedimentation rate (Completed)   C-reactive protein (Completed)   Brain natriuretic peptide (Completed)   CKD (chronic kidney disease) stage 3, GFR 30-59 ml/min (HCC) (Chronic)    Renal function seems to be stable.  The addition of Jardiance and Delene Loll has not allowed [place] to reduce the dose of standing Lasix.      On amiodarone therapy (Chronic)    We have gradually lowered down her dose.  Recheck thyroid function levels as well as ESR and CRP.  She should be having PFTs checked in October. LFTs can be checked with lipids.      Relevant Orders   EKG 12-Lead   LONG TERM MONITOR (3-14 DAYS)   Thyroid Function Panel (THS+T3+T4+Free) (Completed)   Sedimentation rate (Completed)   C-reactive protein (Completed)   Brain natriuretic peptide (Completed)    ===================================  HPI:    Natasha Woodard is a 81 y.o. female with a PMH below who presents today for ~6 week f/u.  PMH: Dilated Cardiomyopathy probably related to high PVC burden (close to 30%) => normal ischemic evaluation.  Endorses start off at Class III HF. Referred to Dr. Haroldine Laws Advanced Heart Failure Team for right left heart cath => initiated on amiodarone for PVC suppression, and CHF meds titrated EF now back to normal range (Echo February 2023-EF 50 to 55%.  Grade 2 DDFxn)Nwith still persistent diastolic  dysfunction and now chronic Cough as opposed to chronic HFrEF. ==> On carvedilol 25 mg twice daily, Entresto 97-103 mg twice daily, Jardiance 10 mg daily and standing dose daily furosemide 20 mg with sliding scale.  Spironolactone discontinued because of hyperkalemia. Despite many hours of discussion and education on Sliding Scale Lasix with daily weights, she still does not fully understand how to manage her weights.  She has had intermittent episodes requiring additional furosemide treatment.  He is always handled by triage calls APP visits were TID responses. ==> We need to try to keep it simple. High burden PVCs-on amiodarone for suppression.  Amiodarone dose has been reduced to 100 mg 3 days a week.  She is also on high-dose carvedilol. Hyperlipidemia: On rosuvastatin DM-2 on metformin PAD -> LAA  Dopplers September 2022: Severe occlusive PAD not functionally limiting and not severe with limb ischemia.  Recommendation = expectant management with medical therapy and walking. Recently started on ; also sleep study ordered by pulmonology.  Has been started on CPAP   I last saw Ms. Galvao in April 2023.  At that time her weight was 173 pounds with a target weight of ~ 170 pounds.  She was having issues with edema and orthopnea and dyspnea. => Was on standing dose of furosemide 3 days a week but has been increased because recent exacerbation.  There were issues with potassium leading to her spironolactone be discontinued.  JOLYNNE SPURGIN has been seen several times by other providers: She was seen in urgent care and described a tightness in her chest with wheezing nonpositional.  Provided prescription for nebulizer. Pulmonology follow-up on 06/10/2019: BMP ordered along with repeat chest x-ray.  This showed bibasilar streaky air space opacities likely related to atelectasis but no edema.  BNP was elevated at 4600. Concern for bradycardia (C)-was told to stop amiodarone for 2 days and then reduce to  1 mg every other day.  Also told to take her Lasix 40 mg for 3 days and then back to 20 mg daily. 06/17/2022-Caitlin Walker, NP-complaints of dyspnea => telephone call complaining of 3-week history of worsening dyspnea => noted that her breathing was better but not back to baseline.  Dyspneic to 12 pounds; per her estimate, consider dry weight was 165 pounds.  Felt 20% better BMP and BNP ordered. Reeducated about sliding scale Lasix. Suggested the pulmonary medicine to pursue high-res CT.  Also discussed weight loss. Short-term spironolactone, eventually discontinued due to hyperkalemia. 1 month follow-up with Laurann Montana (07/18/2022) had gotten back to going to the [facility] for 30 minutes of walking few times a week.  Dyspnea now 80% improved.  Feeling much better.  Weight at home was down to 161 pounds.  Home BPs in the 130s to 140s. Reiterated low-salt intake, sliding scale Lasix.  Home Pulmonary planning to check CT scan.  Amiodarone reduced 100 mg every other day.  (For ease of taking was converted to Tuesday Thursday Saturday) Continued encouragement of walking diet and exercise changes.   No claudication Seen by Dr. Fletcher Anon ([facility]) on 08/02/2022: Acknowledge that she was trying to walk more with a cane.  Noted that her dyspnea has improved.  Continue to encourage walking program.  Symptoms not lifestyle limiting.  Plan for 1 year follow-up.  Recent Hospitalizations: None  Reviewed  CV studies:    The following studies were reviewed today: (if available, images/films reviewed: From Epic Chart or Care Everywhere) No new studies:  Interval History:   Natasha Woodard returns here today overall feeling a whole lot better.  She is breathing better.  Her swelling is better.  She was very concerned about the heart rate issue but has not necessarily noted anything to suspect her being symptomatic.  No lightheadedness or dizziness except for when she does try to get going very fast.  She denies any  chest pain or pressure at rest or exertion.  She does have some exertional dyspnea but even that is improved with her weight loss.  She is definitely active now walking but is not able to walk as long distance, having to stop intermittently but still able to do so.  CV Review of Symptoms (Summary): positive for - dyspnea on exertion and well-controlled swelling.  No claudication.  Still able to walk.  negative for - chest pain, irregular heartbeat, orthopnea, palpitations, paroxysmal nocturnal dyspnea, rapid heart rate, shortness of breath, or syncope or near syncope, TIA/amaurosis fugax .  No complaints of fatigue.  REVIEWED OF SYSTEMS   Pertinent cardiac symptoms noted above.  Otherwise: Energy level improved.  Dyspnea stable.  Mild myalgias and arthralgias.  Dizzy really when standing up quickly but no syncope or near syncope.  Still has chronic issues with feeling nervous and anxious this somewhat complicates her insomnia as well.  I have reviewed and (if needed) personally updated the patient's problem list, medications, allergies, past medical and surgical history, social and family history.   PAST MEDICAL HISTORY   Past Medical History:  Diagnosis Date   Calcific tendonitis 2008   Treatment of left leg   Cardiomyopathy ([place]) 06/2017   a) Echo: EF 25-30%. GR 1-2 DD w/ elevated LVEDP. Mild valvular Dz; b) Cardiac MRI 2/'19: frequent PVCs (diffiuclt to interpret) - EF ~27% w/ diffuse HK.  No evidence of infarct, infiltrative Dz or myocarditis. -- ? if related to PVCs. c) f/u Echo 6/'19: EF 35-40%. Gr 1 DD. Diffuse HK.-> d) Jan 2020 EF 50 to 55%.  Mod MAC, mod LAE.  Ao Sclerosis; e) Echo 1/'21 - EF 60-65%, Gr II DD. Mild Mod MR.    Chronic combined systolic and diastolic CHF, NYHA class 2 and ACA/AHA stage C    Now essentially resolved, back to simply diastolic CHF   Coronary artery disease, non-occlusive    mild-moderate CAD 06/30/17 cath   CTS (carpal tunnel syndrome)    Diabetes  mellitus    Type II   Dysrhythmia    patient said that she cant remember what it is   Frequent unifocal PVCs 01/2018   Event monitor showed normal sinus rhythm with frequent multifocal PVCs (9%) and PACs.  Nighttime bradycardia suggestive of OSA.   Hiatal hernia    with reflux   Hyperlipidemia    Statin intolerant   Hypertension    Ichthyosis congenita    Intermittent claudication of both lower extremities due to atherosclerosis ([place]) 08/22/2021   LEA Dopplers 09/09/2021:  Right: Total occlusion noted in the superficial femoral artery. Atherosclerosis noted throughout extremity, see note above. Three vessel runoff.  Left: Total occlusion noted in the superficial femoral artery and/or popliteal artery. Total occlusion noted in the distal anterior tibial artery. Athereosclerosis noted throughout extremity.    NSVD (normal spontaneous vaginal delivery) 1971 & 1972   Obesity    Plantar fasciitis    Left   Pulmonary nodule    Imaged multiple times and benign appearing   Wears glasses     PAST SURGICAL HISTORY   Past Surgical History:  Procedure Laterality Date   ABDOMINAL HYSTERECTOMY  1975   TAH-BSO   ABSCESS DRAINAGE  1972   right breast   BREAST BIOPSY Left 01/14/2009   Stereo- Benign   CARDIAC MRI  01/2018   Difficult to interpret 2/2 PVCs. Normal LV size - EF ~27% with diffuse HK. Mild RV dilation - normal fxn. -- NO MYOCARDIAL LGI => no definitive evidence of prior MI, Infiltrative Dz or Myocarditis -- suspect NICM, possibly related to PVCs.   CARPAL TUNNEL RELEASE Right 07/02/2014   Procedure: RIGHT CARPAL TUNNEL RELEASE;  Surgeon: Wynonia Sours, MD;  Location: [facility];  Service: Orthopedics;  Laterality: Right;   CARPAL TUNNEL RELEASE Left 06/11/2015   Procedure: LEFT CARPAL TUNNEL RELEASE;  Surgeon: Daryll Brod, MD;  Location: [facility];  Service: Orthopedics;  Laterality: Left;  REGIONAL/FAB   COLONOSCOPY     corn removal  1968   right    Holter Monitor  06/2017   ~17,000 PVC beats - majority were singlets, some couplets. 44 brief 3-7 beat runs of NSVT. Also noted were less frequent PACs with 11 runs (longest 15 beats)   OPEN REDUCTION INTERNAL FIXATION (ORIF) DISTAL RADIAL FRACTURE Left 07/21/2017   Procedure: OPEN REDUCTION INTERNAL FIXATION (ORIF) LEFT DISTAL RADIAL FRACTURE;  Surgeon: Renette Butters, MD;  Location: [facility];  Service: Orthopedics;  Laterality: Left;   RIGHT/LEFT HEART CATH AND CORONARY ANGIOGRAPHY N/A 06/30/2017   Procedure: Right/Left Heart Cath and Coronary Angiography;  Surgeon: Leonie Man, MD;  Location: [facility] INVASIVE CV LAB:  pRCA 55%, pCx 40%, OM1 45%, mCx 50%, D2 50% - LVEF 25-35%. Moderately elevated LVEDP(26 mmHg with PCWP 16 mmHg).  FICK CO/CI: 4.47/2.48. PA pressures 47/14 mmHg with a mean of 27 mm.   ROTATOR CUFF REPAIR     left shoulder   TRANSTHORACIC ECHOCARDIOGRAM  06/2017   EF 25 and 30%. GR 1 DD. Mild diastolic dysfunction with elevated LVEDP. Mild valvular disease.   TRANSTHORACIC ECHOCARDIOGRAM  11'18, 6/'19    a) EF remains 30-35%.  Diffuse hypokinesis noted.  Severe LA dilation; b) Improved EF 35-40%.  Diffuse HK.  GR 1 DD.  Moderate TR.  Mild RV dilation.   TRANSTHORACIC ECHOCARDIOGRAM  01/28/2022   Follow-up echo: EF 50 to 55%.  No RWMA.  GRII DD.  Severe LA dilation.  Mild RA dilation with normal PAP and RAP.  Mild to moderate TR (no significant change noted)   ZIO Patch 14 d Event Monitor  10/2018   Results as below < 1% PVC.  (Notably improved after starting amiodarone)   TTE 01/28/2022 EF 50 to 55%.  No RWMA.  GRII DD.  Severe LA dilation.  Mild RA dilation with normal PAP and RAP.  Mild to moderate TR (no significant change noted)  Immunization History  Administered Date(s) Administered   Fluad Quad(high Dose 65+) 08/19/2021   Influenza Whole 08/12/2010   Influenza,inj,Quad PF,6+ Mos 08/19/2014, 08/15/2015, 09/02/2016, 08/05/2019   Influenza-Unspecified 09/11/2018   PFIZER  Comirnaty(Gray Top)Covid-19 Tri-Sucrose Vaccine 03/23/2021   PFIZER(Purple Top)SARS-COV-2 Vaccination 02/09/2020, 02/29/2020, 09/15/2020   Pneumococcal Conjugate-13 03/10/2015   Pneumococcal Polysaccharide-23 05/26/2006   Td 06/14/2007   Zoster Recombinat (Shingrix) 08/12/2020, 10/12/2020   Zoster, Live 08/10/2006    MEDICATIONS/ALLERGIES   Current Meds  Medication Sig   acetaminophen (TYLENOL) 500 MG tablet Take 500 mg by mouth every 6 (six) hours as needed.   amiodarone (PACERONE) 200 MG tablet Take a half  tablet Tuesday, Thursday and Saturday in the morning.   aspirin 81 MG tablet Take 1 tablet (81 mg total) by mouth daily.   carvedilol (COREG) 25 MG tablet TAKE 1 TABLET BY MOUTH TWICE DAILY   Cholecalciferol (VITAMIN D) 1000 UNITS capsule Take 1,000 Units by mouth daily.   furosemide (LASIX) 20 MG tablet Take 1 tablet (20 mg total) by mouth daily.   glucose blood (ACCU-CHEK AVIVA PLUS) test strip USE TO TEST BLOOD SUGAR ONCE DAILY FOR DM E11.9   JARDIANCE 10 MG TABS tablet TAKE 1/2 TABLET(5 MG) BY MOUTH DAILY   Lancets (ACCU-CHEK MULTICLIX) lancets USE ONE LANCET TO TEST BLOOD GLUCOSE DAILY FOR DM 250.00   metFORMIN (GLUCOPHAGE) 500 MG tablet TAKE 1/2 TABLET BY MOUTH TWICE DAILY WITH A MEAL   nystatin (MYCOSTATIN/NYSTOP) powder Apply  topically 2 (two) times daily.   nystatin cream (MYCOSTATIN) Apply 1 Application topically 2 (two) times daily.   rosuvastatin (CRESTOR) 10 MG tablet Take 1 tablet (10 mg total) by mouth daily.   sacubitril-valsartan (ENTRESTO) 97-103 MG Take 1 tablet by mouth 2 (two) times daily.   traMADol (ULTRAM) 50 MG tablet TAKE 1 TABLET(50 MG) BY MOUTH THREE TIMES DAILY AS NEEDED    Allergies  Allergen Reactions   Ezetimibe-Simvastatin Other (See Comments)    REACTION: Muscle aches (side effect)   Lipitor [Atorvastatin] Other (See Comments)    Leg weakness    Claritin [Loratadine]     Possible cause of nightmares.     Spironolactone     Elevated  potassium/creatinine   Tamiflu [Oseltamivir Phosphate] Other (See Comments)    nightmares   Pravastatin Sodium Other (See Comments)    REACTION: Muscle aches (side effect)   Sulfonamide Derivatives Nausea And Vomiting    SOCIAL HISTORY/FAMILY HISTORY   Reviewed in Epic:  Pertinent findings:  Social History   Tobacco Use   Smoking status: Former    Years: 28.00    Types: Cigarettes    Quit date: 12/12/1993    Years since quitting: 28.7   Smokeless tobacco: Never  Vaping Use   Vaping Use: Never used  Substance Use Topics   Alcohol use: No    Alcohol/week: 0.0 standard drinks of alcohol   Drug use: No   Social History   Social History Narrative   Two kids, two grandchildren, two great grandchildren.    Married 1969   Retired.     OBJCTIVE -PE, EKG, labs   Wt Readings from Last 3 Encounters:  08/31/22 155 lb 12.8 oz (70.7 kg)  08/02/22 161 lb 3.2 oz (73.1 kg)  07/18/22 167 lb (75.8 kg)    Physical Exam: BP 118/66   Pulse 69   Ht _0  (1.575 m)   Wt 155 lb 12.8 oz (70.7 kg)   SpO2 97%   BMI 28.50 kg/m  Physical Exam Vitals reviewed.  Constitutional:      General: She is not in acute distress.    Appearance: She is normal weight. She is not ill-appearing (Well-nourished, well-groomed.) or toxic-appearing.     Comments: This is the first time she has been below the obesity threshold.  BMI less than 30 (28.5).  She has been trying to lose weight cut down her diet.  HENT:     Head: Normocephalic and atraumatic.  Neck:     Vascular: No carotid bruit, hepatojugular reflux or JVD.  Cardiovascular:     Rate and Rhythm: Normal rate and regular rhythm. Occasional Extrasystoles are present.    Chest Wall: PMI is not displaced.     Pulses: Intact distal pulses. Decreased pulses (Diminished, more because of skin condition, but feet and hands are still warm.).     Heart sounds: S1 normal and S2 normal. Heart sounds are distant. No murmur heard.    Friction rub  present. Gallop present. S4 sounds present.  Pulmonary:     Effort: Pulmonary effort is normal. No respiratory distress.     Breath sounds: No wheezing.     Comments: Mild fine bibasilar crackles but no rales rhonchi. Chest:     Chest wall: Tenderness present.  Musculoskeletal:        General: Swelling (Trivial bilateral ankle) present. Normal range of motion.     Cervical back: Normal range of motion and neck supple.  Skin:  General: Skin is warm and dry.     Comments: Stable ichthyosis  Neurological:     General: No focal deficit present.     Mental Status: She is alert and oriented to person, place, and time.  Psychiatric:        Mood and Affect: Mood normal.     Comments: Poor insight.  Poor understanding of explanations.  Seems to be surprised every time she is told that she is doing well.     Adult ECG Report  Rate: 69;  Rhythm: normal sinus rhythm and left axis deviation, nonspecific IVCD/RBBB with nonspecific ST-T wave changes ;   Narrative Interpretation: Stable  Recent Labs: Reviewed Lab Results  Component Value Date   CHOL 141 03/19/2021   HDL 42.10 03/19/2021   LDLCALC 81 03/19/2021   LDLDIRECT 53 08/10/2009   TRIG 89.0 03/19/2021   CHOLHDL 3 03/19/2021   Lab Results  Component Value Date   CREATININE 1.36 (H) 07/07/2022   BUN 24 07/07/2022   NA 145 (H) 07/07/2022   K 4.5 07/07/2022   CL 100 07/07/2022   CO2 25 07/07/2022      Latest Ref Rng & Units 01/14/2022    3:51 PM 03/19/2021   11:21 AM 10/09/2020    9:35 AM  CBC  WBC 3.8 - 10.8 Thousand/uL 4.3  4.3  3.9   Hemoglobin 11.7 - 15.5 g/dL 13.5  13.6  14.2   Hematocrit 35.0 - 45.0 % 39.9  41.8  44.9   Platelets 140 - 400 Thousand/uL 287  333.0  379     Lab Results  Component Value Date   HGBA1C 6.4 (H) 01/14/2022   Lab Results  Component Value Date   TSH 1.23 03/19/2021    ================================================== I spent a total of 25 minutes with the patient spent in direct  patient consultation.  Additional time spent with chart review  / charting (studies, outside notes, etc): 24 min Total Time: 49 min  Current medicines are reviewed at length with the patient today.  (+/- concerns) n/a  Notice: This dictation was prepared with Dragon dictation along with smart phrase technology. Any transcriptional errors that result from this process are unintentional and may not be corrected upon review.  Studies Ordered:   Orders Placed This Encounter  Procedures   Thyroid Function Panel (THS+T3+T4+Free)   Sedimentation rate   C-reactive protein   Brain natriuretic peptide   LONG TERM MONITOR (3-14 DAYS)   EKG 12-Lead   No orders of the defined types were placed in this encounter.   Patient Instructions / Medication Changes & Studies & Tests Ordered   Patient Instructions  Medication Instructions:   Not needed  *If you need a refill on your cardiac medications before your next appointment, please call your pharmacy*   Lab Work:  Thyroid panel BNP CRP Sed rate   If you have labs (blood work) drawn today and your tests are completely normal, you will receive your results only by: MyChart Message (if you have MyChart) OR A paper copy in the mail If you have any lab test that is abnormal or we need to change your treatment, we will call you to review the results.   Testing/Procedures:  Will be called  to schedule for  come to the office to have monitor placed- [place] has recommended that you wear a holter monitor Zio 3 days. Holter monitors are medical devices that record the heart's  electrical activity. Doctors most often use these monitors to diagnose arrhythmias. Arrhythmias are problems with the speed or rhythm of the heartbeat. The monitor is a small, portable device. You can wear one while you do your normal daily activities. This is usually used to diagnose what is causing palpitations/syncope (passing  out).    Follow-Up: At [facility] you and your health needs are our priority.  As part of our continuing mission to provide you with exceptional heart care, we have created designated Provider Care Teams.  These Care Teams include your primary Cardiologist (physician) and Advanced Practice Providers (APPs -  Physician Assistants and Nurse Practitioners) who all work together to provide you with the care you need, when you need it.  We recommend signing up for the patient portal called "MyChart".  Sign up information is provided on this After Visit Summary.  MyChart is used to connect with patients for Virtual Visits (Telemedicine).  Patients are able to view lab/test results, encounter notes, upcoming appointments, etc.  Non-urgent messages can be sent to your provider as well.   To learn more about what you can do with MyChart, go to [email or web address].    Your next appointment:   6 weeks (10/11/2022)  The format for your next appointment:   In Person  Provider:   Glenetta Hew, MD    Other Instructions  ZIO XT- Long Term Monitor Instructions  Your physician has requested you wear a ZIO patch monitor for 3 days.     Leonie Man, MD, MS Glenetta Hew, M.D., M.S. Interventional Cardiologist  [place]  Pager # [phone] Phone # [phone] [address] [place], [place] [address]   Thank you for choosing [facility] [place]!!

## 2022-08-31 NOTE — Patient Instructions (Addendum)
Medication Instructions:   Not needed  *If you need a refill on your cardiac medications before your next appointment, please call your pharmacy*   Lab Work:  Thyroid panel BNP CRP Sed rate   If you have labs (blood work) drawn today and your tests are completely normal, you will receive your results only by: MyChart Message (if you have MyChart) OR A paper copy in the mail If you have any lab test that is abnormal or we need to change your treatment, we will call you to review the results.   Testing/Procedures:  Will be called  to schedule for  come to the office to have monitor placed- [place] has recommended that you wear a holter monitor Zio 3 days. Holter monitors are medical devices that record the heart's electrical activity. Doctors most often use these monitors to diagnose arrhythmias. Arrhythmias are problems with the speed or rhythm of the heartbeat. The monitor is a small, portable device. You can wear one while you do your normal daily activities. This is usually used to diagnose what is causing palpitations/syncope (passing out).    Follow-Up: At [facility], you and your health needs are our priority.  As part of our continuing mission to provide you with exceptional heart care, we have created designated Provider Care Teams.  These Care Teams include your primary Cardiologist (physician) and Advanced Practice Providers (APPs -  Physician Assistants and Nurse Practitioners) who all work together to provide you with the care you need, when you need it.  We recommend signing up for the patient portal called "MyChart".  Sign up information is provided on this After Visit Summary.  MyChart is used to connect with patients for Virtual Visits (Telemedicine).  Patients are able to view lab/test results, encounter notes, upcoming appointments, etc.  Non-urgent messages can be sent to your provider as well.   To learn more about what  you can do with MyChart, go to [email or web address].    Your next appointment:   6 weeks (10/11/2022)  The format for your next appointment:   In Person  Provider:   Glenetta Hew, MD    Other Instructions  ZIO XT- Long Term Monitor Instructions  Your physician has requested you wear a ZIO patch monitor for 3 days.  This is a single patch monitor. [employer] supplies one patch monitor per enrollment. Additional stickers are not available. Please do not apply patch if you will be having a Nuclear Stress Test,  Echocardiogram, Cardiac CT, MRI, or Chest Xray during the period you would be wearing the  monitor. The patch cannot be worn during these tests. You cannot remove and re-apply the  ZIO XT patch monitor.  Your ZIO patch monitor will be mailed 3 day USPS to your address on file. It may take 3-5 days  to receive your monitor after you have been enrolled.  Once you have received your monitor, please review the enclosed instructions. Your monitor  has already been registered assigning a specific monitor serial # to you.  Billing and Patient Assistance Program Information  We have supplied [employer] with any of your insurance information on file for billing purposes. [employer] offers a sliding scale Patient Assistance Program for patients that do not have  insurance, or whose insurance does not completely cover the cost of the ZIO monitor.  You must apply for the Patient Assistance Program to qualify for this discounted rate.  To apply, please call [employer]  at [phone], select option 4, select option 2, ask to apply for  Patient Assistance Program. Theodore Demark will ask your household income, and how many people  are in your household. They will quote your out-of-pocket cost based on that information.  [employer] will also be able to set up a 63-month, interest-free payment plan if needed.  Applying the monitor   Shave hair from upper left chest.  Hold abrader disc by orange tab. Rub  abrader in 40 strokes over the upper left chest as  indicated in your monitor instructions.  Clean area with 4 enclosed alcohol pads. Let dry.  Apply patch as indicated in monitor instructions. Patch will be placed under collarbone on left  side of chest with arrow pointing upward.  Rub patch adhesive wings for 2 minutes. Remove white label marked "1". Remove the white  label marked "2". Rub patch adhesive wings for 2 additional minutes.  While looking in a mirror, press and release button in center of patch. A small green light will  flash 3-4 times. This will be your only indicator that the monitor has been turned on.  Do not shower for the first 24 hours. You may shower after the first 24 hours.  Press the button if you feel a symptom. You will hear a small click. Record Date, Time and  Symptom in the Patient Logbook.  When you are ready to remove the patch, follow instructions on the last 2 pages of Patient  Logbook. Stick patch monitor onto the last page of Patient Logbook.  Place Patient Logbook in the blue and white box. Use locking tab on box and tape box closed  securely. The blue and white box has prepaid postage on it. Please place it in the mailbox as  soon as possible. Your physician should have your test results approximately 7 days after the  monitor has been mailed back to [facility].  Call [facility] at [phone] if you have questions regarding  your ZIO XT patch monitor. Call them immediately if you see an orange light blinking on your  monitor.  If your monitor falls off in less than 4 days, contact our Monitor department at [phone].  If your monitor becomes loose or falls off after 4 days call [employer] at [phone] for  suggestions on securing your monitor

## 2022-08-31 NOTE — Progress Notes (Unsigned)
Enrolled for [employer] to mail a ZIO XT long term holter monitor to the patients address on file.   ZIO XT serial # [id number] mailed to patient and applied to patient in office on 09/08/22.

## 2022-09-02 ENCOUNTER — Telehealth: Payer: Self-pay | Admitting: Cardiology

## 2022-09-02 ENCOUNTER — Encounter: Payer: Self-pay | Admitting: Cardiology

## 2022-09-02 ENCOUNTER — Ambulatory Visit: Payer: Medicare Other | Admitting: Cardiology

## 2022-09-02 NOTE — Assessment & Plan Note (Signed)
Currently she is no longer bradycardic, but has been shown to have low heart rates in the past several months reportedly at least into the 40s.  We have backed off on her amiodarone, but are staying steady with the carvedilol dose.  I would like for her to wear a monitor for at least 3 days to see if we can evaluate what her heart rate trends are at home.  She would not be able to check her heart rates on her own.  Of asked that she check her heart rate when she is walking and she does not know how she would we will do that.  The monitor will help [place] to evaluate heart rate trends but also chronotropic competence.

## 2022-09-02 NOTE — Assessment & Plan Note (Signed)
She was not fasting today.  She should have fasting lipid panel done this year.  Not having any done since April 2022.  She is taking rosuvastatin.  Need to follow these LFTs and lipids.  Would recommend this to be drawn when she sees her PCP next.

## 2022-09-02 NOTE — Assessment & Plan Note (Signed)
We have gradually lowered down her dose.  Recheck thyroid function levels as well as ESR and CRP.  She should be having PFTs checked in October. LFTs can be checked with lipids.

## 2022-09-02 NOTE — Assessment & Plan Note (Signed)
Renal function seems to be stable.  The addition of Jardiance and Delene Loll has not allowed [place] to reduce the dose of standing Lasix.

## 2022-09-02 NOTE — Telephone Encounter (Signed)
RN   contacted patient .   Instruction given -  the monitor has been mailed already, but  patient should be receiving a call today from a scheduler ( April ) to set up an appointment for next week.  Patient is aware to keep monitor in the box and bring it with her to the appointment next week to the [address] office.

## 2022-09-02 NOTE — Assessment & Plan Note (Signed)
Essentially resolved with the suppression of PVCs and initiation of guideline directed medical therapy.  Most recent echocardiogram showed EF of 50 to 55%.  There is residual grade 2 diastolic function and severe left atrial dilation.  Would like to continue to suppress PVCs but use as little amiodarone is necessary.  For now continue 100 mg amiodarone 3 days a week along with carvedilol. Continue Entresto and [employer]

## 2022-09-02 NOTE — Telephone Encounter (Signed)
Patient wanted to know if  monitor suppose to be sent  her or is she to come to the office. RN informed patient she needs to have  monitor placed at the office.    RN informed patient  will  contact  monitor  representative and call her back .

## 2022-09-02 NOTE — Assessment & Plan Note (Addendum)
Intermittent episodes of Class IIb-III CHF but usually stable Class Ib-IIa.  We are now at the all-time low weight for her, 155.  She seems to be feeling a whole lot better.  Part of it is that she has lost weight but also part has been fluid weight.  When I last saw her her dry weight at home was 170, now elevated at 155. I spent another 15+ minutes talking to her about sliding scale Lasix.  Plan: Continue for now current dose of carvedilol, Entresto and Jardiance.  Also continue with standing dose of torsemide 20 mg daily.  But we did again mention the sliding scale concept of taking an additional dose daily until weight back down to normal for weight gains greater than 3 pounds in 1 day or 5 pounds in 1 week.  New dry weight now 155 pounds at home.  We will check a new BNP level (I imagine her baseline BNP will be elevated because of the dilated left atrium.  Just need to see what her new baseline is when she is stable.  Not on spironolactone because of issues with hyperkalemia.  Attempted restarting it led again to hyperkalemia.

## 2022-09-02 NOTE — Assessment & Plan Note (Signed)
BP well controlled on current dose of carvedilol and Entresto.

## 2022-09-02 NOTE — Telephone Encounter (Signed)
  Pt calling requesting  to speak with RN Ivin Booty regarding her heart monitor. She said she did receive a call but not from [place], she did not remember the name of who called her but its different from what Ivin Booty told her. She wanted to speak with Ivin Booty first to get clarification about the heart monitor

## 2022-09-06 NOTE — Progress Notes (Signed)
Asked by pt to take her pulse while walking Will meet on track x 5 days to measure for 5 min walk 09/05/22: Rest: 68 pulse (manually), 56 pulse ox O2 92  At 3 min: pulse 84 O2 at 92. Does wheeze with walking, wheezing stops with rest At 5 min: 96 pulse manually Rest 1 min: 80 pulse Pulse ox works intermittently with dark nail polish. Talked about putting a lighter color on one finger so we could accurately capture her pulse and oxygen  Denies COPD but does have an inhaler Able to walk better with 2 doses of tramadol per day  09/06/22: Rest: 72 pulse (manually) Oxygen ranged from 89-97 At 3 min: pulse 85 oxygen 92 At 5 min: pulse 84 Oxygen 94 Oxygen does drop while walking and talking. Rises with rest.  Not currently using inhaler but will get it filled so we can see what happens tomorrow and Thursday.  Rest 1 min: 77 pulse 96 oxygen

## 2022-09-08 ENCOUNTER — Ambulatory Visit: Payer: Medicare Other | Attending: Cardiovascular Disease

## 2022-09-08 DIAGNOSIS — R001 Bradycardia, unspecified: Secondary | ICD-10-CM

## 2022-09-08 DIAGNOSIS — I493 Ventricular premature depolarization: Secondary | ICD-10-CM

## 2022-09-08 DIAGNOSIS — I10 Essential (primary) hypertension: Secondary | ICD-10-CM

## 2022-09-08 DIAGNOSIS — I420 Dilated cardiomyopathy: Secondary | ICD-10-CM | POA: Diagnosis not present

## 2022-09-08 DIAGNOSIS — I5032 Chronic diastolic (congestive) heart failure: Secondary | ICD-10-CM

## 2022-09-08 DIAGNOSIS — Z79899 Other long term (current) drug therapy: Secondary | ICD-10-CM

## 2022-09-09 LAB — C-REACTIVE PROTEIN: CRP: <1 mg/L (ref 0–10)

## 2022-09-09 LAB — TSH+T3+FREE T4+T3 FREE
Free T-3: 2.8 pg/mL
Free T4 by Dialysis: 1.7 ng/dL
TSH: 1.8 uU/mL
Triiodothyronine (T-3), Serum: 86 ng/dL

## 2022-09-09 LAB — BRAIN NATRIURETIC PEPTIDE: BNP: 1178.5 pg/mL — ABNORMAL HIGH (ref 0.0–100.0)

## 2022-09-09 LAB — SEDIMENTATION RATE: Sed Rate: 4 mm/hr (ref 0–40)

## 2022-09-12 ENCOUNTER — Telehealth: Payer: Self-pay | Admitting: Family Medicine

## 2022-09-12 ENCOUNTER — Other Ambulatory Visit: Payer: Self-pay | Admitting: Family Medicine

## 2022-09-12 NOTE — Telephone Encounter (Signed)
Pt called stating pharmacy stated they sent in for pt's inhaler to be approved by dr. Damita Dunnings. Pt wanted to know the update on when it'd be approved? Call back # is [phone]

## 2022-09-12 NOTE — Telephone Encounter (Signed)
Rx has been sent  

## 2022-09-13 NOTE — Progress Notes (Signed)
Met with pt yesterday and today to monitor her pulse while exercising 10/2 walked on track: Rest pulse 61 O2 96 1 lap or about 3 min: pulse 78 O2 96 5 min pulse 84 O2 95 Rest >1 min 75 pulse O2 93-96 Hasn't gotten inhaler filled yet Seemed to tolerate exercise ok  10/3 pt wanted to try seated eliptical At rest: pulse 70 O2 96 3 min pulse 53 O2 95 (walking and using both arms and legs 9 min pulse 37 O2 88 Took a long break. Manually checked pulse 64 and irreg Restarted exercise pulse 66 and O2 96  Encouraged to break exercise apart: 5 min, rest, 5 min rest, 5 min rest or 1 lap on track, rest, 1 lap, rest, 1 lap rest Noted script has been called in for inhaler, will let her know.

## 2022-09-18 ENCOUNTER — Other Ambulatory Visit: Payer: Self-pay | Admitting: Family Medicine

## 2022-09-18 DIAGNOSIS — E1151 Type 2 diabetes mellitus with diabetic peripheral angiopathy without gangrene: Secondary | ICD-10-CM

## 2022-09-20 ENCOUNTER — Other Ambulatory Visit (INDEPENDENT_AMBULATORY_CARE_PROVIDER_SITE_OTHER): Payer: Medicare Other

## 2022-09-20 ENCOUNTER — Other Ambulatory Visit: Payer: Medicare Other

## 2022-09-20 DIAGNOSIS — E1151 Type 2 diabetes mellitus with diabetic peripheral angiopathy without gangrene: Secondary | ICD-10-CM | POA: Diagnosis not present

## 2022-09-20 DIAGNOSIS — I5032 Chronic diastolic (congestive) heart failure: Secondary | ICD-10-CM | POA: Diagnosis not present

## 2022-09-20 DIAGNOSIS — I493 Ventricular premature depolarization: Secondary | ICD-10-CM | POA: Diagnosis not present

## 2022-09-20 LAB — CBC WITH DIFFERENTIAL/PLATELET
Basophils Absolute: 0.1 10*3/uL (ref 0.0–0.1)
Basophils Relative: 2 % (ref 0.0–3.0)
Eosinophils Absolute: 0.2 10*3/uL (ref 0.0–0.7)
Eosinophils Relative: 6.2 % — ABNORMAL HIGH (ref 0.0–5.0)
HCT: 41.4 % (ref 36.0–46.0)
Hemoglobin: 13.4 g/dL (ref 12.0–15.0)
Lymphocytes Relative: 32.8 % (ref 12.0–46.0)
Lymphs Abs: 1.1 10*3/uL (ref 0.7–4.0)
MCHC: 32.5 g/dL (ref 30.0–36.0)
MCV: 90.9 fl (ref 78.0–100.0)
Monocytes Absolute: 0.4 10*3/uL (ref 0.1–1.0)
Monocytes Relative: 11.8 % (ref 3.0–12.0)
Neutro Abs: 1.7 10*3/uL (ref 1.4–7.7)
Neutrophils Relative %: 47.2 % (ref 43.0–77.0)
Platelets: 274 10*3/uL (ref 150.0–400.0)
RBC: 4.55 Mil/uL (ref 3.87–5.11)
RDW: 15.3 % (ref 11.5–15.5)
WBC: 3.5 10*3/uL — ABNORMAL LOW (ref 4.0–10.5)

## 2022-09-20 LAB — COMPREHENSIVE METABOLIC PANEL
ALT: 12 U/L (ref 0–35)
AST: 14 U/L (ref 0–37)
Albumin: 4 g/dL (ref 3.5–5.2)
Alkaline Phosphatase: 59 U/L (ref 39–117)
BUN: 14 mg/dL (ref 6–23)
CO2: 32 mEq/L (ref 19–32)
Calcium: 9.8 mg/dL (ref 8.4–10.5)
Chloride: 104 mEq/L (ref 96–112)
Creatinine, Ser: 1.35 mg/dL — ABNORMAL HIGH (ref 0.40–1.20)
GFR: 36.71 mL/min — ABNORMAL LOW (ref >60.00)
Glucose, Bld: 132 mg/dL — ABNORMAL HIGH (ref 70–99)
Potassium: 4.4 mEq/L (ref 3.5–5.1)
Sodium: 144 mEq/L (ref 135–145)
Total Bilirubin: 0.6 mg/dL (ref 0.2–1.2)
Total Protein: 6.4 g/dL (ref 6.0–8.3)

## 2022-09-20 LAB — LIPID PANEL
Cholesterol: 104 mg/dL (ref 0–200)
HDL: 39.4 mg/dL (ref >39.00)
LDL Cholesterol: 40 mg/dL (ref 0–99)
NonHDL: 64.51
Total CHOL/HDL Ratio: 3
Triglycerides: 124 mg/dL (ref 0.0–149.0)
VLDL: 24.8 mg/dL (ref 0.0–40.0)

## 2022-09-20 LAB — HEMOGLOBIN A1C: Hgb A1c MFr Bld: 6.9 % — ABNORMAL HIGH (ref 4.6–6.5)

## 2022-09-25 DIAGNOSIS — G4733 Obstructive sleep apnea (adult) (pediatric): Secondary | ICD-10-CM | POA: Diagnosis not present

## 2022-09-27 ENCOUNTER — Ambulatory Visit (INDEPENDENT_AMBULATORY_CARE_PROVIDER_SITE_OTHER): Payer: Medicare Other | Admitting: Family Medicine

## 2022-09-27 ENCOUNTER — Encounter: Payer: Self-pay | Admitting: Family Medicine

## 2022-09-27 VITALS — BP 136/84 | HR 70 | Temp 97.4°F | Ht 62.0 in | Wt 166.0 lb

## 2022-09-27 DIAGNOSIS — Z0000 Encounter for general adult medical examination without abnormal findings: Secondary | ICD-10-CM

## 2022-09-27 DIAGNOSIS — E1151 Type 2 diabetes mellitus with diabetic peripheral angiopathy without gangrene: Secondary | ICD-10-CM

## 2022-09-27 DIAGNOSIS — M2550 Pain in unspecified joint: Secondary | ICD-10-CM

## 2022-09-27 DIAGNOSIS — I10 Essential (primary) hypertension: Secondary | ICD-10-CM

## 2022-09-27 DIAGNOSIS — E1169 Type 2 diabetes mellitus with other specified complication: Secondary | ICD-10-CM | POA: Diagnosis not present

## 2022-09-27 DIAGNOSIS — E785 Hyperlipidemia, unspecified: Secondary | ICD-10-CM | POA: Diagnosis not present

## 2022-09-27 DIAGNOSIS — Z7189 Other specified counseling: Secondary | ICD-10-CM

## 2022-09-27 MED ORDER — METFORMIN HCL 500 MG PO TABS: ORAL_TABLET | ORAL | 0 refills | Status: DC

## 2022-09-27 NOTE — Patient Instructions (Addendum)
You can call for a mammogram at the [facility]  Reasonable to get the RSV vaccine at the pharmacy.  Ask about a tetanus shot but I would only do one at a time.    Plan on recheck in 3-4 months with labs/A1c at the visit.    Take care.  Glad to see you.

## 2022-09-27 NOTE — Progress Notes (Unsigned)
Diabetes:  Using medications without difficulties: yes Hypoglycemic episodes:no Hyperglycemic episodes: no Feet problems: no Blood Sugars averaging: ~100-120 eye exam within last year: pending  Hypertension:    Using medication without problems or lightheadedness:  Chest pain with exertion:yes Edema:no Short of breath: she has some SOB with exertion, still walking 1 mile at the [employer] but using a cane and resting on laps.  Her inhalers help.  She is seeing pulmonary soon.   Elevated Cholesterol: Using medications without problems: yes Muscle aches: likely not from statin  Diet compliance: yes Exercise: yes  Colonoscopy: n/a given her age.   Mammogram: due, d/w pt Bone Density: 07/25/2018 Influenza vaccine: 2023 Pneumococcal vaccine: Completed series Tdap vaccine:  Discussed with patient. Shingles vaccine: prev done.  Covid vaccine d/w pt.   Advance directive- husband designated if patient were incapacitated.   Tramadol helped with TID dosing on days when she worked out.  D/w pt about using TID daily, even if not working out since it helped a lot.  No ADE on med.    PMH and SH reviewed  Meds, vitals, and allergies reviewed.   ROS: Per HPI unless specifically indicated in ROS section   GEN: nad, alert and oriented HEENT: ncat NECK: supple w/o LA CV: rrr. PULM: ctab, no inc wob ABD: soft, +bs EXT: no edema SKIN: no acute rash but chronic skin changes noted.  At baseline.  Diabetic foot exam: Normal inspection No skin breakdown No calluses  Normal DP pulses Normal sensation to light touch and monofilament Nails normal except for R 1st nail absent.

## 2022-09-28 NOTE — Assessment & Plan Note (Signed)
Continue Crestor.  Labs discussed with patient. 

## 2022-09-28 NOTE — Assessment & Plan Note (Signed)
She is on follow-up with eye clinic.  A1c is reasonable.  Continue working on diet and exercise.  Continue Jardiance and metformin.

## 2022-09-28 NOTE — Assessment & Plan Note (Signed)
Tramadol helped with TID dosing on days when she worked out.  D/w pt about using TID daily, even if not working out since it helped a lot.  No ADE on med.

## 2022-09-28 NOTE — Assessment & Plan Note (Signed)
Colonoscopy: n/a given her age.   Mammogram: due, d/w pt Bone Density: 07/25/2018 Influenza vaccine: 2023 Pneumococcal vaccine: Completed series Tdap vaccine:  Discussed with patient. Shingles vaccine: prev done.  Covid vaccine d/w pt.   Advance directive- husband designated if patient were incapacitated.

## 2022-09-28 NOTE — Assessment & Plan Note (Signed)
Her lungs are clear.  Continue carvedilol and Entresto.  She is on a follow-up with pulmonary about her dyspnea.  She is still able to walk a mile, stopping intermittently.  She appears euvolemic and is not edematous.

## 2022-09-28 NOTE — Assessment & Plan Note (Signed)
Advance directive- husband designated if patient were incapacitated.  

## 2022-10-07 ENCOUNTER — Other Ambulatory Visit: Payer: Self-pay | Admitting: *Deleted

## 2022-10-07 DIAGNOSIS — J849 Interstitial pulmonary disease, unspecified: Secondary | ICD-10-CM

## 2022-10-08 NOTE — Progress Notes (Signed)
HPI female former smoker followed for OSA, complicated by dilated cardiomyopathy, CHF/systolic and diastolic, DM 2, HBP, ichthyosis HST-10/24/2018 Complex (central and obstructive) sleep apnea, AHI 5.9/hour, desaturation to 81%, body weight 178 pounds HST 03/17/22- AHI 25.5/ hr, desaturation to 77%(mean88%), body weight 178 lbs PFT 10/10/22- WNL ============================================================  06/09/22- 81 year old female former smoker previously followed for mild OSA, complicated by dilated cardiomyopathy, CHF/systolic and diastolic,/ amiodsarone,  Gr2DD PAD, DM 2, HBP, ichthyosis Covid vax- 4 [employer] -Combivent,  HST 03/17/22- AHI 25.5/ hr, desaturation to 77%(mean 88%), body weight 178 lbs CPAP auto5-15/ [facility] ordered 04/05/22 Download compliance- 97%, AHI 5.4/ hr Body weight today-168 lbs Urgent Care 6/23 for acute bronchitis> neb Duoneb. Suggested PFT since on amiodarone. Noted SOB worse since starting CPAP.  -----Patient was recently seen at urgent care and DX with bronchitis. She states that she is feeling a little better still having shortness of breath with activity. Denies cough. Patient is sleeping good.  Note pt had vascular congestion on CXR and elevated BNP in February. Download reviewed.  She is adjusting to CPAP with a fullface mask and thinks she is sleeping fairly well.  We compared current residual AHI of 5.4/hour with her baseline AHI of 25.5 on her sleep study. She still has cough since her urgent care visit last week.  She says she never had purulent sputum, sore throat or fever.  I compared symptoms of acute bronchitis with symptoms of mild pulmonary vascular congestion as noted on chest x-ray and with elevated BNP in February.  She has been on amiodarone and may eventually benefit from HRCT but in the short run it will help to clarify if she seems volume overloaded.  10/10/22-  81 year old female former smoker previously followed for mild OSA, complicated by dilated  cardiomyopathy, CHF/systolic and diastolic,/ amiodarone,  Gr2DD PAD, DM 2, HTN, Ichthyosis, Covid vax- 7 [employer] Flu vax-had -Combivent,  HST 03/17/22- AHI 25.5/ hr, desaturation to 77%(mean 88%), body weight 178 lbs CPAP auto5-15/ [facility] ordered 04/05/22 Download compliance- 100%, AHI 11.7/ hr    (centrals 6.5, obst 3.5) (8.6-13.1 cwp) Body weight today-161 lbs PFT 10/10/22- WNL Download reviewed.  Doing well with CPAP. PFT reviewed-essentially normal.  She asked "why am I still short of breath".  She is not anemic and not coughing or wheezing.  At age 83, some deconditioning and her known cardiac disease are probably sufficient explanation. CT scan results reviewed.  Minor fibrosis does not appear progressive but we will watch it. We will ask her PCP to follow-up on exophytic kidney lesion. HRCT chest 07/29/22- IMPRESSION: 1. There are findings in the lung bases which could be indicative of interstitial lung disease, or could simply reflect areas of mild post infectious or inflammatory scarring. Given the presence of cylindrical bronchiectasis, this is technically categorized as probable usual interstitial pneumonia (UIP), however, these findings are very mild and bronchiectasis in this region was evident in retrospect dating back to 2010, which could suggest an alternative etiology. Repeat high-resolution chest CT is recommended in 12                    months to assess for temporal changes in the appearance of the lung parenchyma. 2. Aortic atherosclerosis, in addition to three-vessel coronary artery disease. 3. Cardiomegaly with left atrial dilatation. 4. Indeterminate exophytic 2.4 x 1.9 cm intermediate attenuation        <<<<<<< lesion extending off the medial aspect of the upper pole of the left kidney, new compared to 2010.  Follow-up nonemergent abdominal MRI with and without IV gadolinium is recommended in the near future to exclude the possibility of renal neoplasm. 5. Left  adrenal myelolipoma redemonstrated.   Aortic Atherosclerosis (ICD10-I70.0).   ROS-see HPI   + = positive Constitutional:    weight loss, night sweats, fevers, chills, fatigue, lassitude. HEENT:    headaches, difficulty swallowing, tooth/dental problems, sore throat,       sneezing, itching, ear ache, nasal congestion, post nasal drip, snoring CV:    chest pain, orthopnea, PND, swelling in lower extremities, anasarca,         dizziness, +palpitations Resp:   +shortness of breath with exertion or at rest.                productive cough,   +non-productive cough, coughing up of blood.              change in color of mucus.  wheezing.   Skin:    +rash or lesions. GI:  No-   heartburn, indigestion, abdominal pain, nausea, vomiting, diarrhea,                 change in bowel habits, loss of appetite GU: dysuria, change in color of urine, no urgency or frequency.   flank pain. MS:   joint pain, stiffness, decreased range of motion, back pain. Neuro-     nothing unusual Psych:  change in mood or affect.  depression or anxiety.   memory loss.  OBJ- Physical Exam General- Alert, Oriented, Affect-appropriate, Distress- none acute, + overweight Skin- + ichthyosis with hyperpigmentation Lymphadenopathy- none Head- atraumatic            Eyes- Gross vision intact, PERRLA, conjunctivae and secretions clear            Ears-+ hard of hearing            Nose- turbinate edema, no-Septal dev, mucus, polyps, erosion, perforation             Throat- Mallampati IV , mucosa clear , drainage- none, tonsils- atrophic, + own teeth Neck- flexible , trachea midline, no stridor , thyroid nl, carotid no bruit Chest - symmetrical excursion , unlabored           Heart/CV- RRR , no murmur , no gallop  , no rub, nl s1 s2                           - JVD- none , edema- none, stasis changes- none, varices- none           Lung- clear to P&A/not hearing crackles, wheeze- none, cough-none , dullness-none, rub- none            Chest wall-  Abd-  Br/ Gen/ Rectal- Not done, not indicated Extrem- cyanosis- none, clubbing, none, atrophy- none, strength- nl Neuro- grossly intact to observation

## 2022-10-10 ENCOUNTER — Encounter: Payer: Self-pay | Admitting: Internal Medicine

## 2022-10-10 ENCOUNTER — Ambulatory Visit (INDEPENDENT_AMBULATORY_CARE_PROVIDER_SITE_OTHER): Payer: Medicare Other | Admitting: Internal Medicine

## 2022-10-10 ENCOUNTER — Ambulatory Visit: Payer: Medicare Other | Admitting: Internal Medicine

## 2022-10-10 VITALS — BP 124/68 | HR 64 | Ht 62.0 in | Wt 161.2 lb

## 2022-10-10 DIAGNOSIS — G4733 Obstructive sleep apnea (adult) (pediatric): Secondary | ICD-10-CM | POA: Diagnosis not present

## 2022-10-10 DIAGNOSIS — J849 Interstitial pulmonary disease, unspecified: Secondary | ICD-10-CM | POA: Diagnosis not present

## 2022-10-10 DIAGNOSIS — R93429 Abnormal radiologic findings on diagnostic imaging of unspecified kidney: Secondary | ICD-10-CM | POA: Diagnosis not present

## 2022-10-10 DIAGNOSIS — G4731 Primary central sleep apnea: Secondary | ICD-10-CM

## 2022-10-10 LAB — PULMONARY FUNCTION TEST
DL/VA % pred: 107 %
DL/VA: 4.44 ml/min/mmHg/L
DLCO cor % pred: 84 %
DLCO cor: 14.68 ml/min/mmHg
DLCO unc % pred: 84 %
DLCO unc: 14.68 ml/min/mmHg
FEF 25-75 Post: 1.67 L/sec
FEF 25-75 Pre: 1.32 L/sec
FEF2575-%Change-Post: 27 %
FEF2575-%Pred-Post: 140 %
FEF2575-%Pred-Pre: 110 %
FEV1-%Change-Post: 9 %
FEV1-%Pred-Post: 83 %
FEV1-%Pred-Pre: 76 %
FEV1-Post: 1.41 L
FEV1-Pre: 1.29 L
FEV1FVC-%Change-Post: 2 %
FEV1FVC-%Pred-Pre: 107 %
FEV6-%Change-Post: 6 %
FEV6-%Pred-Post: 78 %
FEV6-%Pred-Pre: 73 %
FEV6-Post: 1.68 L
FEV6-Pre: 1.58 L
FEV6FVC-%Change-Post: -2 %
FEV6FVC-%Pred-Post: 103 %
FEV6FVC-%Pred-Pre: 106 %
FVC-%Change-Post: 6 %
FVC-%Pred-Post: 75 %
FVC-%Pred-Pre: 71 %
FVC-Post: 1.73 L
FVC-Pre: 1.63 L
Post FEV1/FVC ratio: 81 %
Post FEV6/FVC ratio: 97 %
Pre FEV1/FVC ratio: 79 %
Pre FEV6/FVC Ratio: 100 %
RV % pred: 91 %
RV: 2.12 L
TLC % pred: 88 %
TLC: 4.18 L

## 2022-10-10 MED ORDER — TRELEGY ELLIPTA 100-62.5-25 MCG/ACT IN AEPB: 1.0000 | INHALATION_SPRAY | Freq: Every day | RESPIRATORY_TRACT | 0 refills | Status: DC

## 2022-10-10 NOTE — Progress Notes (Signed)
Full PFT performed today. °

## 2022-10-10 NOTE — Patient Instructions (Signed)
Order- DME [employer]- please change to autopap range to 5-20  Order- future HRCT in 1 year  dx ILD  Order- sample x 2 Trelegy 100 inhaler   Inhale 1 puff then rinse mouth, once every day.  This is a maintenance inhaler to use every day while the samples last, to see if it helps your breathing.   You can still use our Combivent inhaler   1-2 puffs, twice daily as needed

## 2022-10-10 NOTE — Patient Instructions (Signed)
Full PFT performed today. °

## 2022-10-11 ENCOUNTER — Encounter: Payer: Self-pay | Admitting: Internal Medicine

## 2022-10-11 ENCOUNTER — Encounter: Payer: Self-pay | Admitting: Cardiology

## 2022-10-11 ENCOUNTER — Ambulatory Visit: Payer: Medicare Other | Attending: Cardiology | Admitting: Cardiology

## 2022-10-11 VITALS — BP 104/70 | HR 61 | Ht 62.0 in | Wt 162.8 lb

## 2022-10-11 DIAGNOSIS — I10 Essential (primary) hypertension: Secondary | ICD-10-CM

## 2022-10-11 DIAGNOSIS — Z79899 Other long term (current) drug therapy: Secondary | ICD-10-CM | POA: Diagnosis not present

## 2022-10-11 DIAGNOSIS — E1151 Type 2 diabetes mellitus with diabetic peripheral angiopathy without gangrene: Secondary | ICD-10-CM | POA: Diagnosis not present

## 2022-10-11 DIAGNOSIS — J849 Interstitial pulmonary disease, unspecified: Secondary | ICD-10-CM | POA: Insufficient documentation

## 2022-10-11 DIAGNOSIS — I70213 Atherosclerosis of native arteries of extremities with intermittent claudication, bilateral legs: Secondary | ICD-10-CM

## 2022-10-11 DIAGNOSIS — E1169 Type 2 diabetes mellitus with other specified complication: Secondary | ICD-10-CM | POA: Diagnosis not present

## 2022-10-11 DIAGNOSIS — I420 Dilated cardiomyopathy: Secondary | ICD-10-CM | POA: Diagnosis not present

## 2022-10-11 DIAGNOSIS — N1831 Chronic kidney disease, stage 3a: Secondary | ICD-10-CM | POA: Diagnosis not present

## 2022-10-11 DIAGNOSIS — R93429 Abnormal radiologic findings on diagnostic imaging of unspecified kidney: Secondary | ICD-10-CM | POA: Insufficient documentation

## 2022-10-11 DIAGNOSIS — I493 Ventricular premature depolarization: Secondary | ICD-10-CM | POA: Diagnosis not present

## 2022-10-11 DIAGNOSIS — I5032 Chronic diastolic (congestive) heart failure: Secondary | ICD-10-CM

## 2022-10-11 DIAGNOSIS — E785 Hyperlipidemia, unspecified: Secondary | ICD-10-CM

## 2022-10-11 NOTE — Assessment & Plan Note (Signed)
Indeterminate exophytic 2.4 x 1.9 cm intermediate attenuation lesion extending off the medial aspect of the upper pole of the left kidney, new compared to 2010. Follow-up nonemergent abdominal MRI with and without IV gadolinium is recommended in the near future

## 2022-10-11 NOTE — Patient Instructions (Signed)
Medication Instructions:    Weigh yourself daily.  Lasix ( furosemide )  is your friend  and use it if weight goes over 3 lbs overnight or 5 lbs in a week .  If you go out of town do not forget to take your Lasix.  *If you need a refill on your cardiac medications before your next appointment, please call your pharmacy*   Lab Work: No changes    Testing/Procedures:  Not needed  Follow-Up: At [facility], you and your health needs are our priority.  As part of our continuing mission to provide you with exceptional heart care, we have created designated Provider Care Teams.  These Care Teams include your primary Cardiologist (physician) and Advanced Practice Providers (APPs -  Physician Assistants and Nurse Practitioners) who all work together to provide you with the care you need, when you need it.     Your next appointment:   6 month(s)  The format for your next appointment:   In Person  Provider:   Glenetta Hew, MD

## 2022-10-11 NOTE — Assessment & Plan Note (Signed)
Benefits from CPAP with good compliance.  Residual centrals contribute to residual AHI 11.7/hour.  Medically this is probably not important in the absence of significant sleep disturbance.  We will watch over time and consider overnight oximetry if indicated. Plan-change AutoPap range to 5-20

## 2022-10-11 NOTE — Progress Notes (Signed)
Primary Care Provider: Tonia Ghent, MD [facility] Cardiologist: Glenetta Hew, MD Advanced Heart Failure Cardiologist: Dr. Haroldine Laws PV cardiologist: Dr. Fletcher Anon (last seen 08/02/2022) Electrophysiologist: None  Clinic Note: Chief Complaint  Patient presents with   Follow-up    6 weeks.  Stable.   Congestive Heart Failure    Stable    ===================================  ASSESSMENT/PLAN   Problem List Items Addressed This Visit       Cardiology Problems   Chronic heart failure with preserved ejection fraction (HFpEF) (HCC) - Primary (Chronic)    Stable class IIb-III CHF.  Still not very clear on concept of reducing salt intake and/scale Lasix.  She weighs herself but does not know what to do when she was resolved.  The last episode of exacerbation she Vascepa 9 pounds and had laid herself in several days and did not want to do. I stressed the importance of daily weights and adjusting Lasix by increasing dose for weight gain greater than 3 pounds.  Her dry weight at home and been down as low as 155 pounds.  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  She may very well benefit from some type of outpatient monitor of volume status, but currently these monitors are not available, except for short-term use.   Plan: Continue current max dose of carvedilol (25 mg twice daily) and Entresto (97-103 mg twice daily) as well as Jardiance 10 mg daily. Continue standing dose of Lasix 20 mg daily with PRN dosing based on sliding scale as mentioned on multiple occasions. Not on spironolactone because of Hyperkalemia      Dilated cardiomyopathy (HCC) (Chronic)    Nonischemic cardiomyopathy and likely related to PVC burden.  Thankfully, despite reducing her amiodarone dose we are down roughly 6% PVC burden.  We will continue amiodarone at 100 mg 3 days a week.  Thankfully, CRP and ESR were not  indicative of inflammation nor were the PFTs indicative of reduced DLCO.      Frequent PVCs (Chronic)    Previously been 9% PVC burden now down to 6% PVC burden with lower dose of amiodarone.  I think low-dose maintenance is good.  Thankfully no signs of amiodarone toxicity.      Hyperlipidemia associated with type 2 diabetes mellitus (HCC) (Chronic)    Latest lipids show LDL of 40 on current dose of rosuvastatin.  No change.      Type II diabetes mellitus with peripheral artery disease (HCC) (Chronic)    Managed by PCP.  Last A1c was 6.9.  She is on Jardiance and metformin, consider SGLT2 inhibitor.      Essential hypertension (Chronic)    Blood pressure pretty well controlled on current doses of carvedilol, Entresto and spironolactone.        Other   Intermittent claudication of both lower extremities due to atherosclerosis (HCC) (Chronic)    Being followed by Dr. Kathlyn [place].  Would probably be due for follow-up Dopplers next year prior to annual follow-up      CKD (chronic kidney disease) stage 3, GFR 30-59 ml/min (HCC) (Chronic)    Creatinine stable at 1.3      On amiodarone therapy (Chronic)    Recent LFTs, PFTs, TFTs as well as ESR and CRP all normal.  No signs of toxicity.       ===================================  HPI:    Natasha Woodard is a 81 y.o. female with  a PMH below who presents today for Workin visit to discuss complaints of worsening CHF, and also discuss results of her event monitor.  PMH: July 2018: Dilated Cardiomyopathy => EF 25 to 30% GRII DD.  Cardiac cath showed no coronary disease.  Monitor showed 30% PVC burden. Referred to Dr. Haroldine Laws: Initiated amiodarone for PVCs suppression GDMT medication titration.  As of February 2023: EF up to 50 to 55% with GR 2 DD.  Still with DHF/HFpEF: Was on carvedilol 25 mg twice daily, Entresto 97/one 3 mg twice daily, Jardiance 10 mg daily, furosemide 20 mg with sliding scale which she does not  comprehend. Frequent PVCs: Amiodarone has been titrated down to 100 mg few days a week along with high-dose carvedilol. DM-2 on Jardiance and metformin HTN on Entresto and carvedilol along with furosemide. HLD-on Crestor PAD: LEA Dopplers September 2022 showed severe occlusive PAD but not felt to be functionally limiting. OSA-recently started on CPAP =>  HST-10/24/2018 Complex (central and obstructive) sleep apnea, AHI 5.9/hour, desaturation to 81%, body weight 178 pounds HST 03/17/22- AHI 25.5/ hr, desaturation to 77%(mean88%), body weight 178 lbs > CPAP auto 5-15 adapt 25.3 PFT 10/10/22- WNL Ichthyosis  DEZARIA METHOT was last seen on 08/31/2022-she was feeling a lot better.  She had been seen a couple times by Laurann Montana, NP for titration of both her diuretic.  Is usually the case, she just did not understand the concept of increasing her diuretic.  We did clarify for her to take her amiodarone on Tuesday Thursday and Saturday.  Importance of salt intake reduction as well as sliding scale Lasix. => She noted having exertional dyspnea, well-controlled edema.  No claudication no sense palpitations.  Energy level improved.  Dyspnea improved.  Some dizziness with standing but no syncope or near syncope. Thyroid panel along with CRP and ESR check along with BMP.  Also 3-day Zio patch monitor checked to reassess PVC burden on lower dose of amiodarone. Plan was six-week follow-up  Recent Hospitalizations: none  Seen by Dr. Damita Dunnings on 09/27/2022  Seen by Dr. Keturah Barre on 10/10/2022-reviewed results of PFTs and most recent sleep evaluation.  Reviewed results of the chest CT => plan to follow-up HR CT chest in 1 year.  Recommended PCP check abdominal MRI to assess kidney lesion.  Noted compliance with CPAP   Reviewed  CV studies:    The following studies were reviewed today: (if available, images/films reviewed: From Epic Chart or Care Everywhere) Zio patch monitor (Sept 2023)    Predominant Underlying Rhythm: Sinus rhythm: HR range 47-91 bpm, Avg 64 bpm; w/ 1  AVB along with BBB.   RARE Isolated premature atrial contractions PACs (<1%) noted, with rare couplets / Triplets   FREQUENT isolated premature ventricular contractions (PVCs) (6.1%) noted, with rare (<1%) couplets & triplets; brief episodes of bigeminy and trigeminy -> 1 episode with 6 PVCs (polymorphic) noted but not sustained.   2 brief atrial Runs: Fastest and longest = 8 beats w/ HR Range 88-101 bpm, Avg 93 bpm, 5.2 sec; 2ND 7 Beats w/ HR Range 84-98 bpm, Avg 94 bpm, 4.4 sec   No Sustained Arrhythmias: Atrial Tachycardia (AT), Supraventricular Tachycardia (SVT), Atrial Fibrillation (A-Fib), Atrial Flutter (A-Flutter), Sustained Ventricular Tachycardia (VT)  High-Res Chest CT 07/29/2022: Findings in the lung bases that could be indicative of the ILD or simply postinfectious/inflammatory scarring.  Also noted cylindrical bronchiectasis suggestive of UIP.  Very mild and has been evidence of bronchiectasis in this region evident back to 2010.  Recommend follow-up CT in 12 months.  Three-vessel CAD and aortic calcification noted.  Left atrial dilation noted.  Left kidney exophytic 2.4 x 1.9 cm lesion on the left kidney upper pole.  Recommend MRI of the abdomen.  Left adrenal myelolipoma.  Interval History:   BRAYLEIGH RYBACKI presents here today for 6-week follow-up to discuss the results of her monitor.  She thankfully has not had any further episodes where she has had worsening dyspnea.  She was very concerned about the episode prior to last visit where she had increased her Lasix.  She realized that she needed to take extra Lasix, but was scared and wanted guidance.  She has been dealing with her cardiomyopathy for 5 years now but still is not sure what to do when she gains her weight.  This is despite going through with her at least 3 times each visit. For now she is not having PND, orthopnea or edema.  She still  has some exertional dyspnea, but at baseline-not walking as much as he had been doing.  Not walking as far, still having to stop for now and then to catch her breath..  No chest pain or pressure with rest or exertion.  CV Review of Symptoms (Summary): positive for - dyspnea on exertion and well-controlled edema.  Stable exertional dyspnea. negative for - chest pain, irregular heartbeat, orthopnea, palpitations, paroxysmal nocturnal dyspnea, rapid heart rate, shortness of breath, or lightheadedness or dizziness, syncope/near syncope or TIA/amaurosis fugax, claudication  REVIEWED OF SYSTEMS   Review of Systems  Constitutional:  Positive for malaise/fatigue and weight loss (Has continued to drop weight but now stabilized.).  HENT:  Positive for congestion.   Respiratory:  Positive for cough (Off-and-on) and shortness of breath (With notable exertion).   Gastrointestinal:  Negative for blood in stool, constipation and melena.  Genitourinary:  Negative for dysuria, frequency and hematuria.  Musculoskeletal:  Positive for joint pain and myalgias.       Stable findings  Neurological:  Negative for dizziness and focal weakness.       Poor memory  Psychiatric/Behavioral:  Positive for memory loss. Negative for depression. The patient is nervous/anxious. The patient does not have insomnia (Seems to be doing better now.).     I have reviewed and (if needed) personally updated the patient's problem list, medications, allergies, past medical and surgical history, social and family history.   PAST MEDICAL HISTORY   Past Medical History:  Diagnosis Date   Calcific tendonitis 2008   Treatment of left leg   Cardiomyopathy ([place]) 06/2017   a) Echo: EF 25-30%. GR 1-2 DD w/ elevated LVEDP. Mild valvular Dz; b) Cardiac MRI 2/'19: frequent PVCs (diffiuclt to interpret) - EF ~27% w/ diffuse HK.  No evidence of infarct, infiltrative Dz or myocarditis. -- ? if related to PVCs. c) f/u Echo 6/'19: EF 35-40%. Gr 1  DD. Diffuse HK.-> d) Jan 2020 EF 50 to 55%.  Mod MAC, mod LAE.  Ao Sclerosis; e) Echo 1/'21 - EF 60-65%, Gr II DD. Mild Mod MR.    Chronic combined systolic and diastolic CHF, NYHA class 2 and ACA/AHA stage C    Now essentially resolved, back to simply diastolic CHF   Coronary artery disease, non-occlusive    mild-moderate CAD 06/30/17 cath   CTS (carpal tunnel syndrome)    Diabetes mellitus    Type II   Dysrhythmia    patient said that she cant remember what it is   Frequent unifocal PVCs 01/2018  Event Monitor: NSR, W/ frequent multifocal PVCs (9%-down from 30% prior to amiodarone) and PACs.  Nighttime bradycardia suggestive of OSA.';  Zio patch September 2023: PVC burden now 6.1%.   Hiatal hernia    with reflux   Hyperlipidemia    Statin intolerant   Hypertension    Ichthyosis congenita    Intermittent claudication of both lower extremities due to atherosclerosis ([place]) 08/22/2021   LEA Dopplers 09/09/2021:  Right: Total occlusion noted in the superficial femoral artery. Atherosclerosis noted throughout extremity, see note above. Three vessel runoff.  Left: Total occlusion noted in the superficial femoral artery and/or popliteal artery. Total occlusion noted in the distal anterior tibial artery. Athereosclerosis noted throughout extremity.    NSVD (normal spontaneous vaginal delivery) 1971 & 1972   Obesity    Plantar fasciitis    Left   Pulmonary nodule    Imaged multiple times and benign appearing   Wears glasses     PAST SURGICAL HISTORY   Past Surgical History:  Procedure Laterality Date   ABDOMINAL HYSTERECTOMY  1975   TAH-BSO   ABSCESS DRAINAGE  1972   right breast   BREAST BIOPSY Left 01/14/2009   Stereo- Benign   CARDIAC MRI  01/2018   Difficult to interpret 2/2 PVCs. Normal LV size - EF ~27% with diffuse HK. Mild RV dilation - normal fxn. -- NO MYOCARDIAL LGI => no definitive evidence of prior MI, Infiltrative Dz or Myocarditis -- suspect NICM, possibly related to  PVCs.   CARPAL TUNNEL RELEASE Right 07/02/2014   Procedure: RIGHT CARPAL TUNNEL RELEASE;  Surgeon: Wynonia Sours, MD;  Location: [place];  Service: Orthopedics;  Laterality: Right;   CARPAL TUNNEL RELEASE Left 06/11/2015   Procedure: LEFT CARPAL TUNNEL RELEASE;  Surgeon: Daryll Brod, MD;  Location: [place];  Service: Orthopedics;  Laterality: Left;  REGIONAL/FAB   COLONOSCOPY     corn removal  1968   right   Holter Monitor  06/2017   ~17,000 PVC beats - majority were singlets, some couplets. 44 brief 3-7 beat runs of NSVT. Also noted were less frequent PACs with 11 runs (longest 15 beats)   OPEN REDUCTION INTERNAL FIXATION (ORIF) DISTAL RADIAL FRACTURE Left 07/21/2017   Procedure: OPEN REDUCTION INTERNAL FIXATION (ORIF) LEFT DISTAL RADIAL FRACTURE;  Surgeon: Renette Butters, MD;  Location: [place];  Service: Orthopedics;  Laterality: Left;   RIGHT/LEFT HEART CATH AND CORONARY ANGIOGRAPHY N/A 06/30/2017   Procedure: Right/Left Heart Cath and Coronary Angiography;  Surgeon: Leonie Man, MD;  Location: [facility] INVASIVE CV LAB:  pRCA 55%, pCx 40%, OM1 45%, mCx 50%, D2 50% - LVEF 25-35%. Moderately elevated LVEDP(26 mmHg with PCWP 16 mmHg).  FICK CO/CI: 4.47/2.48. PA pressures 47/14 mmHg with a mean of 27 mm.   ROTATOR CUFF REPAIR     left shoulder   TRANSTHORACIC ECHOCARDIOGRAM  06/2017   EF 25 and 30%. GR 1 DD. Mild diastolic dysfunction with elevated LVEDP. Mild valvular disease.   TRANSTHORACIC ECHOCARDIOGRAM  11'18, 6/'19    a) EF remains 30-35%.  Diffuse hypokinesis noted.  Severe LA dilation; b) Improved EF 35-40%.  Diffuse HK.  GR 1 DD.  Moderate TR.  Mild RV dilation.   TRANSTHORACIC ECHOCARDIOGRAM  01/28/2022   Follow-up echo: EF 50 to 55%.  No RWMA.  GRII DD.  Severe LA dilation.  Mild RA dilation with normal PAP and RAP.  Mild to moderate TR (no significant change noted)   Zio patch  08/2022   3-day: Currently NSR (HR range 47-91 with average 64 bpm) 1  F AVB with IVCD/BBB.  Rare isolated PACs (<1%).  Frequent PVCs (6.1% with rare couplets and triplets.  Also bigeminy.  2 atrial runs 8 beats 5 2 seconds.  No sustained arrhythmias.   ZIO Patch 14 d Event Monitor  10/2018   Results as below < 1% PVC.  (Notably improved after starting amiodarone)    Immunization History  Administered Date(s) Administered   Fluad Quad(high Dose 65+) 08/19/2021   Influenza Whole 08/12/2010   Influenza, High Dose Seasonal PF 08/26/2022   Influenza,inj,Quad PF,6+ Mos 08/19/2014, 08/15/2015, 09/02/2016, 08/05/2019   Influenza-Unspecified 09/11/2018   PFIZER Comirnaty(Gray Top)Covid-19 Tri-Sucrose Vaccine 03/23/2021   PFIZER(Purple Top)SARS-COV-2 Vaccination 12/31/2019, 01/21/2020, 09/15/2020, 03/23/2021, 09/26/2022   Pfizer Covid-19 Vaccine Bivalent Booster 51yr & up 10/01/2021   Pneumococcal Conjugate-13 03/10/2015   Pneumococcal Polysaccharide-23 05/26/2006   Td 06/14/2007   Zoster Recombinat (Shingrix) 08/12/2020, 10/12/2020   Zoster, Live 08/10/2006    MEDICATIONS/ALLERGIES   Current Meds  Medication Sig   amiodarone (PACERONE) 200 MG tablet Take a half  tablet Tuesday, Thursday and Saturday in the morning.   aspirin 81 MG tablet Take 1 tablet (81 mg total) by mouth daily.   carvedilol (COREG) 25 MG tablet TAKE 1 TABLET BY MOUTH TWICE DAILY   Cholecalciferol (VITAMIN D) 1000 UNITS capsule Take 1,000 Units by mouth daily.   COMBIVENT RESPIMAT 20-100 MCG/ACT AERS respimat INHALE 1 PUFF INTO THE LUNGS EVERY 6 HOURS AS NEEDED FOR WHEEZING OR SHORTNESS OF BREATH   Fluticasone-Umeclidin-Vilant (TRELEGY ELLIPTA) 100-62.5-25 MCG/ACT AEPB Inhale 1 puff into the lungs daily.   furosemide (LASIX) 20 MG tablet Take 1 tablet (20 mg total) by mouth daily.   glucose blood (ACCU-CHEK AVIVA PLUS) test strip USE TO TEST BLOOD SUGAR ONCE DAILY FOR DM E11.9   JARDIANCE 10 MG TABS tablet TAKE 1/2 TABLET(5 MG) BY MOUTH DAILY   Lancets (ACCU-CHEK MULTICLIX) lancets USE  ONE LANCET TO TEST BLOOD GLUCOSE DAILY FOR DM 250.00   metFORMIN (GLUCOPHAGE) 500 MG tablet TAKE 1/2 TABLET BY MOUTH DAILY WITH A MEAL   rosuvastatin (CRESTOR) 10 MG tablet Take 1 tablet (10 mg total) by mouth daily.   sacubitril-valsartan (ENTRESTO) 97-103 MG Take 1 tablet by mouth 2 (two) times daily.   traMADol (ULTRAM) 50 MG tablet TAKE 1 TABLET(50 MG) BY MOUTH THREE TIMES DAILY AS NEEDED    Allergies  Allergen Reactions   Ezetimibe-Simvastatin Other (See Comments)    REACTION: Muscle aches (side effect)   Lipitor [Atorvastatin] Other (See Comments)    Leg weakness    Claritin [Loratadine]     Possible cause of nightmares.     Spironolactone     Elevated potassium/creatinine   Tamiflu [Oseltamivir Phosphate] Other (See Comments)    nightmares   Pravastatin Sodium Other (See Comments)    REACTION: Muscle aches (side effect)   Sulfonamide Derivatives Nausea And Vomiting    SOCIAL HISTORY/FAMILY HISTORY   Reviewed in Epic:  Pertinent findings:  Social History   Tobacco Use   Smoking status: Former    Years: 28.00    Types: Cigarettes    Quit date: 12/12/1993    Years since quitting: 28.8   Smokeless tobacco: Never  Vaping Use   Vaping Use: Never used  Substance Use Topics   Alcohol use: No    Alcohol/week: 0.0 standard drinks of alcohol   Drug use: No   Social History   Social History  Narrative   Two kids, two grandchildren, two great grandchildren.    Married 1969   Retired.     OBJCTIVE -PE, EKG, labs   Wt Readings from Last 3 Encounters:  10/11/22 162 lb 12.8 oz (73.8 kg)  10/10/22 161 lb 3.2 oz (73.1 kg)  09/27/22 166 lb (75.3 kg)    Physical Exam: BP 104/70 (BP Location: Left Arm, Patient Position: Sitting, Cuff Size: Normal)   Pulse 61   Ht _0  (1.575 m)   Wt 162 lb 12.8 oz (73.8 kg)   SpO2 93%   BMI 29.78 kg/m  Physical Exam Vitals reviewed.  Constitutional:      General: She is not in acute distress.    Appearance: Normal appearance.  She is obese. She is not ill-appearing or toxic-appearing.     Comments: Borderline obese but not quite at the threshold.  HENT:     Head: Normocephalic and atraumatic.  Neck:     Vascular: No carotid bruit or JVD.  Cardiovascular:     Rate and Rhythm: Normal rate and regular rhythm. Occasional Extrasystoles are present.    Chest Wall: PMI is not displaced.     Pulses: Decreased pulses (Decreased simply due to body habitus).     Heart sounds: S1 normal and S2 normal. Heart sounds are distant. No murmur heard.    No friction rub. Gallop present. S4 sounds present.  Pulmonary:     Effort: Pulmonary effort is normal. No respiratory distress.     Breath sounds: Normal breath sounds. No wheezing, rhonchi or rales.  Musculoskeletal:        General: No swelling. Normal range of motion.     Cervical back: Normal range of motion and neck supple.  Skin:    General: Skin is warm and dry.     Comments: Ichthyosis noted no change  Neurological:     General: No focal deficit present.     Mental Status: She is alert and oriented to person, place, and time.     Gait: Gait normal.  Psychiatric:        Mood and Affect: Mood normal.     Comments: Very poor level understanding.     Adult ECG Report N/A  Recent Labs: Reviewed Lab Results  Component Value Date   CHOL 104 09/20/2022   HDL 39.40 09/20/2022   LDLCALC 40 09/20/2022   LDLDIRECT 53 08/10/2009   TRIG 124.0 09/20/2022   CHOLHDL 3 09/20/2022   Lab Results  Component Value Date   CREATININE 1.35 (H) 09/20/2022   BUN 14 09/20/2022   NA 144 09/20/2022   K 4.4 09/20/2022   CL 104 09/20/2022   CO2 32 09/20/2022      Latest Ref Rng & Units 09/20/2022   12:22 PM 01/14/2022    3:51 PM 03/19/2021   11:21 AM  CBC  WBC 4.0 - 10.5 K/uL 3.5  4.3  4.3   Hemoglobin 12.0 - 15.0 g/dL 13.4  13.5  13.6   Hematocrit 36.0 - 46.0 % 41.4  39.9  41.8   Platelets 150.0 - 400.0 K/uL 274.0  287  333.0     Lab Results  Component Value Date    HGBA1C 6.9 (H) 09/20/2022   Lab Results  Component Value Date   TSH 1.23 03/19/2021    ================================================== I spent a total of 32 minutes with the patient spent in direct patient consultation.  Additional time spent with chart review  / charting (studies, outside notes, etc):  29 min Total Time: 61 min  Current medicines are reviewed at length with the patient today.  (+/- concerns) none  Notice: This dictation was prepared with Dragon dictation along with smart phrase technology. Any transcriptional errors that result from this process are unintentional and may not be corrected upon review.  Studies Ordered:   No orders of the defined types were placed in this encounter.  No orders of the defined types were placed in this encounter.   Patient Instructions / Medication Changes & Studies & Tests Ordered   Patient Instructions  Medication Instructions:    Weigh yourself daily.  Lasix ( furosemide )  is your friend  and use it if weight goes over 3 lbs overnight or 5 lbs in a week .  If you go out of town do not forget to take your Lasix.  *If you need a refill on your cardiac medications before your next appointment, please call your pharmacy*   Lab Work: No changes    Testing/Procedures:  Not needed  Follow-Up: At [facility], you and your health needs are our priority.  As part of our continuing mission to provide you with exceptional heart care, we have created designated Provider Care Teams.  These Care Teams include your primary Cardiologist (physician) and Advanced Practice Providers (APPs -  Physician Assistants and Nurse Practitioners) who all work together to provide you with the care you need, when you need it.     Your next appointment:   6 month(s)  The format for your next appointment:   In Person  Provider:   Glenetta Hew, MD       Leonie Man, MD, MS Glenetta Hew, M.D., M.S. Interventional Cardiologist   [facility]  Pager # [phone] Phone # [phone] [address] [place], [place] [address]   Thank you for choosing [facility]!!

## 2022-10-11 NOTE — Assessment & Plan Note (Signed)
We will follow this.  No need for treatment in the absence of progression.  I doubt this is contributing significantly to her dyspnea on exertion.

## 2022-10-12 ENCOUNTER — Telehealth: Payer: Self-pay | Admitting: Family Medicine

## 2022-10-12 DIAGNOSIS — N2889 Other specified disorders of kidney and ureter: Secondary | ICD-10-CM

## 2022-10-12 NOTE — Telephone Encounter (Signed)
Please contact patient.  Dr. Annamaria Boots sent me a note on 10/11/2022 about her previous CT.  There is a lesion of unknown significance on the left kidney that needs further evaluation, to make sure it is not a problem or a cancer.  It may not be anything problematic but it needs a follow-up scan with an MRI.  I put in the order and signed the order.  Please contact the patient about this so she will know to expect a call.  We will update her when we have the results.  Thanks.

## 2022-10-12 NOTE — Telephone Encounter (Signed)
Spoke with patient and she is aware MRI is needed and is okay with that. Patient will await call to schedule.

## 2022-10-13 ENCOUNTER — Encounter: Payer: Self-pay | Admitting: *Deleted

## 2022-10-13 NOTE — Telephone Encounter (Signed)
Patient called in and was wanting to know why she had a referral put in for a MRI. She stated that nothing was wrong with her labs. She would like for someone to give her a call.

## 2022-10-13 NOTE — Telephone Encounter (Signed)
Spoke with patient and explained to her again why she was needing the MRI. Patient kept questioning why Dr. Annamaria Boots was looking at her Kidneys and I explained to her that other organs can sometimes pop up in the scans that are done. This happened to show a lesion on her left kidney that needs further investigation. I advised that the radiology department will be calling her to set up the appt.

## 2022-10-15 ENCOUNTER — Encounter: Payer: Self-pay | Admitting: Cardiology

## 2022-10-15 NOTE — Assessment & Plan Note (Signed)
Being followed by Dr. Kathlyn [place].  Would probably be due for follow-up Dopplers next year prior to annual follow-up

## 2022-10-15 NOTE — Assessment & Plan Note (Signed)
Creatinine stable at 1.3 

## 2022-10-15 NOTE — Assessment & Plan Note (Signed)
Recent LFTs, PFTs, TFTs as well as ESR and CRP all normal.  No signs of toxicity.

## 2022-10-15 NOTE — Assessment & Plan Note (Signed)
Stable class IIb-III CHF.  Still not very clear on concept of reducing salt intake and/scale Lasix.  She weighs herself but does not know what to do when she was resolved.  The last episode of exacerbation she Vascepa 9 pounds and had laid herself in several days and did not want to do. I stressed the importance of daily weights and adjusting Lasix by increasing dose for weight gain greater than 3 pounds.  Her dry weight at home and been down as low as 155 pounds.  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  She may very well benefit from some type of outpatient monitor of volume status, but currently these monitors are not available, except for short-term use.   Plan: Continue current max dose of carvedilol (25 mg twice daily) and Entresto (97-103 mg twice daily) as well as Jardiance 10 mg daily. Continue standing dose of Lasix 20 mg daily with PRN dosing based on sliding scale as mentioned on multiple occasions. Not on spironolactone because of Hyperkalemia

## 2022-10-15 NOTE — Assessment & Plan Note (Signed)
Blood pressure pretty well controlled on current doses of carvedilol, Entresto and spironolactone.

## 2022-10-15 NOTE — Assessment & Plan Note (Signed)
Managed by PCP.  Last A1c was 6.9.  She is on Jardiance and metformin, consider SGLT2 inhibitor.

## 2022-10-15 NOTE — Assessment & Plan Note (Signed)
Nonischemic cardiomyopathy and likely related to PVC burden.  Thankfully, despite reducing her amiodarone dose we are down roughly 6% PVC burden.  We will continue amiodarone at 100 mg 3 days a week.  Thankfully, CRP and ESR were not indicative of inflammation nor were the PFTs indicative of reduced DLCO.

## 2022-10-15 NOTE — Assessment & Plan Note (Signed)
Previously been 9% PVC burden now down to 6% PVC burden with lower dose of amiodarone.  I think low-dose maintenance is good.  Thankfully no signs of amiodarone toxicity.

## 2022-10-15 NOTE — Assessment & Plan Note (Signed)
Latest lipids show LDL of 40 on current dose of rosuvastatin.  No change.

## 2022-10-17 ENCOUNTER — Telehealth: Payer: Self-pay | Admitting: Internal Medicine

## 2022-10-17 NOTE — Telephone Encounter (Signed)
Sent community message to [employer] to determine issues with settings.  Will await response form [employer]

## 2022-10-18 ENCOUNTER — Other Ambulatory Visit: Payer: Self-pay | Admitting: Family Medicine

## 2022-10-18 ENCOUNTER — Telehealth: Payer: Self-pay

## 2022-10-18 DIAGNOSIS — H524 Presbyopia: Secondary | ICD-10-CM | POA: Diagnosis not present

## 2022-10-18 DIAGNOSIS — E119 Type 2 diabetes mellitus without complications: Secondary | ICD-10-CM | POA: Diagnosis not present

## 2022-10-18 DIAGNOSIS — H25813 Combined forms of age-related cataract, bilateral: Secondary | ICD-10-CM | POA: Diagnosis not present

## 2022-10-18 DIAGNOSIS — H04123 Dry eye syndrome of bilateral lacrimal glands: Secondary | ICD-10-CM | POA: Diagnosis not present

## 2022-10-18 DIAGNOSIS — H40023 Open angle with borderline findings, high risk, bilateral: Secondary | ICD-10-CM | POA: Diagnosis not present

## 2022-10-18 LAB — HM DIABETES EYE EXAM

## 2022-10-18 NOTE — Progress Notes (Signed)
    Chronic Care Management Pharmacy Assistant   Name: Natasha Woodard  MRN: [id number] DOB: 05-06-1941  Reason for Encounter: CCM (Appointment Reminder)  Medications: Outpatient Encounter Medications as of 10/18/2022  Medication Sig   acetaminophen (TYLENOL) 500 MG tablet Take 500 mg by mouth every 6 (six) hours as needed. (Patient not taking: Reported on 10/11/2022)   amiodarone (PACERONE) 200 MG tablet Take a half  tablet Tuesday, Thursday and Saturday in the morning.   aspirin 81 MG tablet Take 1 tablet (81 mg total) by mouth daily.   carvedilol (COREG) 25 MG tablet TAKE 1 TABLET BY MOUTH TWICE DAILY   Cholecalciferol (VITAMIN D) 1000 UNITS capsule Take 1,000 Units by mouth daily.   COMBIVENT RESPIMAT 20-100 MCG/ACT AERS respimat INHALE 1 PUFF INTO THE LUNGS EVERY 6 HOURS AS NEEDED FOR WHEEZING OR SHORTNESS OF BREATH   Fluticasone-Umeclidin-Vilant (TRELEGY ELLIPTA) 100-62.5-25 MCG/ACT AEPB Inhale 1 puff into the lungs daily.   furosemide (LASIX) 20 MG tablet Take 1 tablet (20 mg total) by mouth daily.   glucose blood (ACCU-CHEK AVIVA PLUS) test strip USE TO TEST BLOOD SUGAR ONCE DAILY FOR DM E11.9   JARDIANCE 10 MG TABS tablet TAKE 1/2 TABLET(5 MG) BY MOUTH DAILY   Lancets (ACCU-CHEK MULTICLIX) lancets USE ONE LANCET TO TEST BLOOD GLUCOSE DAILY FOR DM 250.00   metFORMIN (GLUCOPHAGE) 500 MG tablet TAKE 1/2 TABLET BY MOUTH DAILY WITH A MEAL   nystatin (MYCOSTATIN/NYSTOP) powder Apply topically 2 (two) times daily. (Patient not taking: Reported on 10/11/2022)   nystatin cream (MYCOSTATIN) Apply 1 Application topically 2 (two) times daily. (Patient not taking: Reported on 10/11/2022)   rosuvastatin (CRESTOR) 10 MG tablet Take 1 tablet (10 mg total) by mouth daily.   sacubitril-valsartan (ENTRESTO) 97-103 MG Take 1 tablet by mouth 2 (two) times daily.   traMADol (ULTRAM) 50 MG tablet TAKE 1 TABLET(50 MG) BY MOUTH THREE TIMES DAILY AS NEEDED   No facility-administered encounter medications  on file as of 10/18/2022.   Garnette Czech was contacted to remind of upcoming telephone visit with Charlene Brooke on 10/21/2022 at 9:30. Patient was reminded to have any blood glucose and blood pressure readings available for review at appointment.   Message was left reminding patient of appointment.  CCM referral has been placed prior to visit?  No   Star Rating Drugs: Medication:    Last Fill: Day Supply Metformin 500 mg   06/13/22 90 Rosuvastatin 10 mg   07/08/22 90 Sacubitril-Valsartan 97-103 mg 07/08/22 [place]oll[place], CPP notified  Marijean Niemann, [place] Pharmacy Assistant [phone]

## 2022-10-21 ENCOUNTER — Ambulatory Visit: Payer: Medicare Other | Admitting: Pharmacist

## 2022-10-21 ENCOUNTER — Telehealth: Payer: Self-pay | Admitting: *Deleted

## 2022-10-21 DIAGNOSIS — R0600 Dyspnea, unspecified: Secondary | ICD-10-CM

## 2022-10-21 DIAGNOSIS — E1169 Type 2 diabetes mellitus with other specified complication: Secondary | ICD-10-CM

## 2022-10-21 DIAGNOSIS — I10 Essential (primary) hypertension: Secondary | ICD-10-CM

## 2022-10-21 DIAGNOSIS — E1151 Type 2 diabetes mellitus with diabetic peripheral angiopathy without gangrene: Secondary | ICD-10-CM

## 2022-10-21 DIAGNOSIS — I70213 Atherosclerosis of native arteries of extremities with intermittent claudication, bilateral legs: Secondary | ICD-10-CM

## 2022-10-21 DIAGNOSIS — I5032 Chronic diastolic (congestive) heart failure: Secondary | ICD-10-CM

## 2022-10-21 NOTE — Patient Instructions (Addendum)
Visit Information  Phone number for Pharmacist: [phone]   Goals Addressed   None     Care Plan : CCM Pharmacy Care Plan  Updates made by Natasha Woodard, [facility] since 10/21/2022 12:00 AM     Problem: Hypertension, Hyperlipidemia, Diabetes, Heart Failure, and Chronic Kidney Disease, PAD   Priority: High     Long-Range Goal: Disease management   Start Date: 09/29/2021  Expected End Date: 09/29/2022  This Visit's Progress: On track  Recent Progress: On track  Priority: High  Note:   Current Barriers:  None identified  Pharmacist Clinical Goal(s):  Patient will contact provider office for questions/concerns as evidenced notation of same in electronic health record through collaboration with PharmD and provider.   Interventions: 1:1 collaboration with Natasha Nam, MD regarding development and update of comprehensive plan of care as evidenced by provider attestation and co-signature Inter-disciplinary care team collaboration (see longitudinal plan of care) Comprehensive medication review performed; medication list updated in electronic medical record  Hyperlipidemia / PAD (LDL goal < 70) -Controlled-  LDL 40 (10/23) at goal, improved from 81; pt endorses compliance with new rosuvastatin (changed 09/2021 from lovastatin due to leg cramps/LDL not at goal) -New occlusive PAD Sept 2022; considered Xarelto 2.5 mg -Current treatment: Rosuvastatin 10 mg daily - Appropriate, Effective, Safe, Accessible Aspirin 81 mg daily - Appropriate, Effective, Safe, Accessible -Previously tried/failed meds: lovastatin (leg cramps) -[facility] -Educated on Cholesterol goals; Benefits of statin for ASCVD risk reduction; Strategies to manage statin-induced myalgias; benefits of aspirin in PAD -Counseled to continued daily exercise routine -Recommend to continue current medication  Diabetes (A1c goal <7%) -Controlled - A1c 6.9% (10/23) at  goal -Current medications: Metformin 500 mg - 1/2 tab daily - Appropriate, Effective, Safe, Accessible Jardiance 10 mg - 1/2 tab daily - Appropriate, Effective, Safe, Accessible Testing supplies -Educated on A1c and blood sugar goals; Complications of diabetes including kidney damage, retinal damage, and cardiovascular disease; benefits of metformin and Jardiance for DM and CV protection -Recommend to continue current medication  Heart Failure / Hypertension (Goal: BP < 140/90) -Controlled - pt reports compliance with daily furosemide and weight is stable at home -Dry weight is ~160# now -pt has been advised to take extra furosemide 20 mg for wt gain 3+ lbs -Current home BP/HR readings: SBP 120s -Hx frequent PVCs controlled with amiodarone -Last ejection fraction: 50-55% (Date: 01/28/22); previously 25-30% (2018) -HF type: HFimpEF (EF improved from <40% to > 40%); NYHA Class: II (slight actitivty limitation) -Current treatment: Carvedilol 25 mg BID - Appropriate, Effective, Safe, Accessible Furosemide 20 mg daily -Appropriate, Effective, Safe, Accessible Entresto 97-103 mg BID (PAP) -Appropriate, Effective, Safe, Accessible Jardiance 10 mg -1/2 tab daily Kennedy Bucker) -Appropriate, Effective, Safe, Accessible Amiodarone 200 mg -1/2 tab 3x weekly -Appropriate, Effective, Safe, Accessible -Medications previously tried: spironolactone (hyperK)  -Educated on Benefits of medications for managing symptoms and prolonging life; Importance of blood pressure control; proper BP monitoring technique -Reviewed importance of daily weights and discussed calling cardiology with wt gain > 3lbs overnight or 5+ lbs in a week -Recommend to continue current medication  ILD / chronic DOE (Goal: manage symptoms) -Not ideally controlled -Current treatment  Combivent PRN - Appropriate, Query Effective Trelegy (sample given 10/10/22) - not started -Medications previously tried: n/a  -Counseled on Differences between  maintenance and rescue inhalers - discussed pulmonologist had given Trelegy to see if it would help her chronic DOE, if it does not help  she can stop it -Advised trial of Trelegy, monitor breathing symptoms closely for improvement  Health Maintenance -Vaccine gaps: TDAP -discussed benefits/risks of TDAP booster, pt is low risk for TD and would like to forego 10-year booster  Patient Goals/Self-Care Activities Patient will:  - take medications as prescribed -focus on medication adherence by pill box -check blood pressure 3x weekly -collaborate with provider on medication access solutions [place] Pepper, Entresto)      Patient verbalizes understanding of instructions and care plan provided today and agrees to view in MyChart. Active MyChart status and patient understanding of how to access instructions and care plan via MyChart confirmed with patient.    Telephone follow up appointment with pharmacy team member scheduled for: 6 months  Al Corpus, PharmD, [facility] Clinical Pharmacist [facility] [phone]

## 2022-10-21 NOTE — Telephone Encounter (Signed)
Spoke with pt who states pressures have been changed. Nothing further needed at this time.

## 2022-10-21 NOTE — Telephone Encounter (Signed)
Called patient arrange --next appointment for patient in April 2024  Patient verbalized  understanding

## 2022-10-21 NOTE — Progress Notes (Signed)
Chronic Care Management Pharmacy Note  10/21/2022 Name:  Natasha Woodard MRN:  [id number] DOB:  03/16/1941  Summary: CCM F/U visit -Reviewed medications; patient affirms compliance as prescribed -Pt reports weighing daily, weight has been steady around 160-161 lbs with daily furosemide -Pt recently given Trelegy sample to try to see if it would help with chronic DOE per pumonary; she has not started this because she already has Combivent and did not understand the purpose. Discussed maintenance vs rescue inhalers and possible benefit with Trelegy  Recommendations/Changes made from today's visit: -Advised trial of Trelegy, monitor breathing symptoms closely; update pulmonology with effect -Advised to take extra furosemide if weight gain 3+ lbs overnight or 5+ lbs in a week (per recommendations from cardiology)  Plan: -[place] will call 3 months for HF update -Pharmacist follow up televisit scheduled for 6 months -AWV (LPN) 05/17/58    Subjective: Natasha Woodard is an 81 y.o. year old female who is a primary patient of Damita Dunnings, Elveria Rising, MD.  The CCM team was consulted for assistance with disease management and care coordination needs.    Engaged with patient by telephone for follow up visit in response to provider referral for pharmacy case management and/or care coordination services.   Consent to Services:  The patient was given information about Chronic Care Management services, agreed to services, and gave verbal consent prior to initiation of services.  Please see initial visit note for detailed documentation.   Patient Care Team: Tonia Ghent, MD as PCP - General (Family Medicine) Leonie Man, MD as PCP - Cardiology (Cardiology) Bensimhon, Shaune Pascal, MD as PCP - Advanced Heart Failure (Cardiology) Wellington Hampshire, MD as PCP - [facility] Cardiology (Cardiology) Hortencia Pilar, MD as Consulting Physician (Ophthalmology) Charlton Haws, [facility] as  Pharmacist (Pharmacist)   Recent office visits: 09/27/22 Dr Damita Dunnings OV: annual - change metformin to 1/2 tab once daily (from BID).  03/17/22 Dr Damita Dunnings OV: fungal skin infx - rx'd nystatin cream. 01/14/22 Dr Damita Dunnings OV: c/o wheezing, covid exposure (testing negative but husband testing positive); Rx prednisone 10 mg x 5 days. BNP elevated, increase furosemide 40 mg x 3 days, then alternate 40 mg/20 mg for 1 week  08/19/2021 - Elsie Stain, MD - Patient presented for follow up Diabetes. Labs: A1c. Change: metFORMIN (GLUCOPHAGE) 500 MG tablet. Take half tablet by mouth daily with meal (previously twice a day).   Recent consult visits: 10/11/22 Dr Ellyn Hack (Cardiology): HFpEF - discussed daily wts and increasing lasix for wt gain 3+ lbs.  10/10/22 Dr Annamaria Boots (Pulmonary): ILD - mild, no tx, doubtful contribution to DOE. Sample Trelegy inhaler.  08/31/22 Dr Ellyn Hack (Cardiology): HFpEF - wt 155# - part wt loss and part fluid loss. Wear heart monitor x 3 days for bradycardia. BNP chronically elevated.  08/02/22 Dr Fletcher Anon (Cardiology): f/u PAD, HF. No changes.  07/18/22 NP Laurann Montana (Cardiology): f/u SOB - spiro had to be d/c'd due to hyperK.  06/17/22 NP Laurann Montana (Cardiology): SOB - new dry wt 165#. Start spironolactone 12.5 mg. Recheck labs 1-2 wks.  06/09/22 Dr Annamaria Boots (Pulmonary): acute bronchitis - dx 6/23. BNP elevated - acute HF. See cardiology. 06/03/22 Urgent care - bronchitis.  04/05/22 Pulmonary TE - ordered CPAP to [facility]. 03/16/22 Dr Ellyn Hack (cardiology): f/u HF. No med changes. 01/31/22 Dr Annamaria Boots (pulmonary) f/u OSA, HF. Schedule home sleep study. 01/18/22 Dr Fletcher Anon (cardiology): f/u PAD, HF. No med changes. 09/14/2021 - Kathlyn Sacramento, MD - Cardiology - eval  PAD. No medication changes. Emphasized exercise.  Hospital visits: None in previous 6 months   Objective:  Lab Results  Component Value Date   CREATININE 1.35 (H) 09/20/2022   BUN 14 09/20/2022   GFR 36.71 (L) 09/20/2022    GFRNONAA 40 (L) 10/10/2020   GFRAA 49 (L) 10/09/2020   NA 144 09/20/2022   K 4.4 09/20/2022   CALCIUM 9.8 09/20/2022   CO2 32 09/20/2022   GLUCOSE 132 (H) 09/20/2022   Lab Results  Component Value Date/Time   HGBA1C 6.9 (H) 09/20/2022 12:22 PM   HGBA1C 6.4 (H) 01/14/2022 03:51 PM   GFR 36.71 (L) 09/20/2022 12:22 PM   GFR 45.93 (L) 03/19/2021 11:21 AM    Last diabetic Eye exam:  Lab Results  Component Value Date/Time   HMDIABEYEEXA No Retinopathy 10/06/2020 12:00 AM   HMDIABEYEEXA No Retinopathy 10/06/2020 12:00 AM    Last diabetic Foot exam:  Lab Results  Component Value Date/Time   HMDIABFOOTEX yes 01/25/2011 12:00 AM     Lab Results  Component Value Date   CHOL 104 09/20/2022   HDL 39.40 09/20/2022   LDLCALC 40 09/20/2022   LDLDIRECT 53 08/10/2009   TRIG 124.0 09/20/2022   CHOLHDL 3 09/20/2022       Latest Ref Rng & Units 09/20/2022   12:22 PM 01/14/2022    3:51 PM 03/19/2021   11:21 AM  Hepatic Function  Total Protein 6.0 - 8.3 g/dL 6.4  6.6  6.5   Albumin 3.5 - 5.2 g/dL 4.0   3.9   AST 0 - 37 U/L _0 ALT 0 - 35 U/L _1 Alk Phosphatase 39 - 117 U/L 59   56   Total Bilirubin 0.2 - 1.2 mg/dL 0.6  0.7  0.5     Lab Results  Component Value Date/Time   TSH 1.23 03/19/2021 11:21 AM   TSH 2.148 04/17/2020 03:08 PM   TSH 1.87 11/12/2019 09:21 AM   FREET4 1.32 (H) 04/17/2020 03:08 PM   FREET4 1.28 10/08/2018 11:14 AM       Latest Ref Rng & Units 09/20/2022   12:22 PM 01/14/2022    3:51 PM 03/19/2021   11:21 AM  CBC  WBC 4.0 - 10.5 K/uL 3.5  4.3  4.3   Hemoglobin 12.0 - 15.0 g/dL 13.4  13.5  13.6   Hematocrit 36.0 - 46.0 % 41.4  39.9  41.8   Platelets 150.0 - 400.0 K/uL 274.0  287  333.0     Lab Results  Component Value Date/Time   VD25OH 60 07/23/2013 10:13 AM   Clinical ASCVD: Yes  - PAD The ASCVD Risk score (Arnett DK, et al., 2019) failed to calculate for the following reasons:   The 2019 ASCVD risk score is only valid for ages 15 to  70    Lab Results  Component Value Date/Time   BNP 1,178.5 (H) 08/31/2022 04:13 PM   BNP 1,397.5 (H) 06/17/2022 10:00 AM   BNP 1,333 (H) 01/14/2022 03:51 PM   BNP 263.3 (H) 10/10/2019 12:55 PM   BNP 338.7 (H) 10/08/2018 11:15 AM      05/12/2022    4:18 PM 04/08/2021    8:21 AM 11/12/2019    3:07 PM  Depression screen PHQ 2/9  Decreased Interest 0 0 0  Down, Depressed, Hopeless 0 0 0  PHQ - 2 Score 0 0 0  Altered sleeping  0 0  Tired, decreased energy  0 0  Change in appetite  0 0  Feeling bad or failure about yourself   0 0  Trouble concentrating  0 0  Moving slowly or fidgety/restless  0 0  Suicidal thoughts  0 0  PHQ-9 Score  0 0  Difficult doing work/chores  Not difficult at all Not difficult at all     Social History   Tobacco Use  Smoking Status Former   Years: 28.00   Types: Cigarettes   Quit date: 12/12/1993   Years since quitting: 28.8  Smokeless Tobacco Never   BP Readings from Last 3 Encounters:  10/11/22 104/70  10/10/22 124/68  09/27/22 136/84   Pulse Readings from Last 3 Encounters:  10/11/22 61  10/10/22 64  09/27/22 70   Wt Readings from Last 3 Encounters:  10/11/22 162 lb 12.8 oz (73.8 kg)  10/10/22 161 lb 3.2 oz (73.1 kg)  09/27/22 166 lb (75.3 kg)   BMI Readings from Last 3 Encounters:  10/11/22 29.78 kg/m  10/10/22 29.48 kg/m  09/27/22 30.36 kg/m    Assessment/Interventions: Review of patient past medical history, allergies, medications, health status, including review of consultants reports, laboratory and other test data, was performed as part of comprehensive evaluation and provision of chronic care management services.   SDOH:  (Social Determinants of Health) assessments and interventions performed: Yes SDOH Interventions    Flowsheet Row Chronic Care Management from 05/04/2022 in [place] at [place] from 11/12/2019 in [place] at [place] Interventions    Food Insecurity  Interventions Intervention Not Indicated --  Depression Interventions/Treatment  -- GUR4-2 Score <4 Follow-up Not Indicated  Financial Strain Interventions Other (Comment)  [assistance approved for Jardiance, Entresto] --      SDOH Screenings   Food Insecurity: No Food Insecurity (05/12/2022)  Transportation Needs: No Transportation Needs (05/12/2022)  Depression (PHQ2-9): Low Risk  (05/12/2022)  Financial Resource Strain: Low Risk  (05/12/2022)  Physical Activity: Sufficiently Active (05/12/2022)  Stress: No Stress Concern Present (05/12/2022)  Tobacco Use: Medium Risk (10/15/2022)    CCM Care Plan  Allergies  Allergen Reactions   Ezetimibe-Simvastatin Other (See Comments)    REACTION: Muscle aches (side effect)   Lipitor [Atorvastatin] Other (See Comments)    Leg weakness    Claritin [Loratadine]     Possible cause of nightmares.     Spironolactone     Elevated potassium/creatinine   Tamiflu [Oseltamivir Phosphate] Other (See Comments)    nightmares   Pravastatin Sodium Other (See Comments)    REACTION: Muscle aches (side effect)   Sulfonamide Derivatives Nausea And Vomiting    Medications Reviewed Today     Reviewed by Leonie Man, MD (Physician) on 10/15/22 at 1341  Med List Status: <None>   Medication Order Taking? Sig Documenting Provider Last Dose Status Informant  acetaminophen (TYLENOL) 500 MG tablet [id number] No Take 500 mg by mouth every 6 (six) hours as needed.  Patient not taking: Reported on 10/11/2022   [provider] Not Taking Active   amiodarone (PACERONE) 200 MG tablet [id number] Yes Take a half  tablet Tuesday, Thursday and Saturday in the morning. Loel Dubonnet, NP Taking Active   aspirin 81 MG tablet [id number] Yes Take 1 tablet (81 mg total) by mouth daily. Tonia Ghent, MD Taking Active   carvedilol (COREG) 25 MG tablet [id number] Yes TAKE 1 TABLET BY MOUTH TWICE DAILY Tonia Ghent, MD Taking Active   Cholecalciferol (VITAMIN D) 1000  UNITS  capsule [id number] Yes Take 1,000 Units by mouth daily. [provider] Taking Active Self           Med Note Fleet Contras Sep 14, 2017  6:33 AM)    COMBIVENT RESPIMAT 20-100 MCG/ACT AERS respimat [id number] Yes INHALE 1 PUFF INTO THE LUNGS EVERY 6 HOURS AS NEEDED FOR WHEEZING OR SHORTNESS OF BREATH Tonia Ghent, MD Taking Active   Fluticasone-Umeclidin-[facility] ELLIPTA) 100-62.5-25 MCG/ACT AEPB [id number] Yes Inhale 1 puff into the lungs daily. Deneise Lever, MD Taking Active   furosemide (LASIX) 20 MG tablet [id number] Yes Take 1 tablet (20 mg total) by mouth daily. Loel Dubonnet, NP Taking Active   glucose blood (ACCU-CHEK AVIVA PLUS) test strip [id number] Yes USE TO TEST BLOOD SUGAR ONCE DAILY FOR DM E11.9 Tonia Ghent, MD Taking Active   JARDIANCE 10 MG TABS tablet [id number] Yes TAKE 1/2 TABLET(5 MG) BY MOUTH DAILY Leonie Man, MD Taking Active   Lancets (ACCU-CHEK MULTICLIX) lancets [id number] Yes USE ONE LANCET TO TEST BLOOD GLUCOSE DAILY FOR DM 250.00 Tonia Ghent, MD Taking Active   metFORMIN (GLUCOPHAGE) 500 MG tablet [id number] Yes TAKE 1/2 TABLET BY MOUTH DAILY WITH A MEAL Tonia Ghent, MD Taking Active   nystatin (MYCOSTATIN/NYSTOP) powder [id number] No Apply topically 2 (two) times daily.  Patient not taking: Reported on 10/11/2022   Tonia Ghent, MD Not Taking Active   nystatin cream (MYCOSTATIN) [id number] No Apply 1 Application topically 2 (two) times daily.  Patient not taking: Reported on 10/11/2022   Tonia Ghent, MD Not Taking Active   rosuvastatin (CRESTOR) 10 MG tablet [id number] Yes Take 1 tablet (10 mg total) by mouth daily. Tonia Ghent, MD Taking Active   sacubitril-valsartan [facility]) 97-103 MG [id number] Yes Take 1 tablet by mouth 2 (two) times daily. Leonie Man, MD Taking Active   traMADol Veatrice Bourbon) 50 MG tablet [id number] Yes TAKE 1 TABLET(50 MG) BY MOUTH THREE TIMES DAILY AS NEEDED Tonia Ghent, MD  Taking Active             Patient Active Problem List   Diagnosis Date Noted   ILD (interstitial lung disease) ([place]) 10/11/2022   Abnormal CT scan, kidney 10/11/2022   Bradycardia on ECG 08/31/2022   Superficial fungal infection of skin 03/20/2022   Onychomycosis of multiple toenails with type 2 diabetes mellitus and peripheral angiopathy ([place]) 11/16/2021   Pain due to onychomycosis of toenail of left foot 11/16/2021   Intermittent claudication of both lower extremities due to atherosclerosis ([place]) 08/22/2021   Ganglion cyst of wrist 08/22/2021   Rash 02/17/2021   On amiodarone therapy 08/21/2020   CKD (chronic kidney disease) stage 3, GFR 30-59 ml/min (HCC) 09/25/2019   Gastroesophageal reflux disease 06/19/2019   Health care maintenance 11/14/2018   Joint pain 08/07/2018   Sensorineural hearing loss (SNHL), bilateral 03/20/2018   Complex sleep apnea syndrome 10/05/2017   Chronic heart failure with preserved ejection fraction (HFpEF) ([place]) 09/08/2017   Dilated cardiomyopathy ([place]) 06/30/2017   Frequent PVCs 06/30/2017   Advance care planning 08/19/2014   Medicare annual wellness visit, subsequent 07/30/2013   Type II diabetes mellitus with peripheral artery disease ([place]) 05/19/2010   Hyperlipidemia associated with type 2 diabetes mellitus ([place]) 05/19/2010   Essential hypertension 05/19/2010   Ichthyosis 05/19/2010    Immunization History  Administered Date(s) Administered   Fluad Quad(high Dose 65+) 08/19/2021   Influenza Whole 08/12/2010   Influenza, High Dose  Seasonal PF 08/26/2022   Influenza,inj,Quad PF,6+ Mos 08/19/2014, 08/15/2015, 09/02/2016, 08/05/2019   Influenza-Unspecified 09/11/2018   PFIZER Comirnaty(Gray Top)Covid-19 Tri-Sucrose Vaccine 03/23/2021   PFIZER(Purple Top)SARS-COV-2 Vaccination 12/31/2019, 01/21/2020, 09/15/2020, 03/23/2021, 09/26/2022   Pfizer Covid-19 Vaccine Bivalent Booster 78yr & up 10/01/2021   Pneumococcal Conjugate-13 03/10/2015    Pneumococcal Polysaccharide-23 05/26/2006   Td 06/14/2007   Zoster Recombinat (Shingrix) 08/12/2020, 10/12/2020   Zoster, Live 08/10/2006    Conditions to be addressed/monitored:  Hypertension, Hyperlipidemia, Diabetes, Heart Failure, and Chronic Kidney Disease, PAD  Care Plan : [place] Updates made by FCharlton Haws [place] 10/21/2022 12:00 AM     Problem: Hypertension, Hyperlipidemia, Diabetes, Heart Failure, and Chronic Kidney Disease, PAD   Priority: High     Long-Range Goal: Disease management   Start Date: 09/29/2021  Expected End Date: 09/29/2022  This Visit's Progress: On track  Recent Progress: On track  Priority: High  Note:   Current Barriers:  None identified  Pharmacist Clinical Goal(s):  Patient will contact provider office for questions/concerns as evidenced notation of same in electronic health record through collaboration with PharmD and provider.   Interventions: 1:1 collaboration with DTonia Ghent MD regarding development and update of comprehensive plan of care as evidenced by provider attestation and co-signature Inter-disciplinary care team collaboration (see longitudinal plan of care) Comprehensive medication review performed; medication list updated in electronic medical record  Hyperlipidemia / PAD (LDL goal < 70) -Controlled-  LDL 40 (10/23) at goal, improved from 81; pt endorses compliance with new rosuvastatin (changed 09/2021 from lovastatin due to leg cramps/LDL not at goal) -New occlusive PAD Sept 2022; considered Xarelto 2.5 mg -Current treatment: Rosuvastatin 10 mg daily - Appropriate, Effective, Safe, Accessible Aspirin 81 mg daily - Appropriate, Effective, Safe, Accessible -Previously tried/failed meds: lovastatin (leg cramps) -[facility] -Educated on Cholesterol goals; Benefits of statin for ASCVD risk reduction; Strategies to manage statin-induced myalgias;  benefits of aspirin in PAD -Counseled to continued daily exercise routine -Recommend to continue current medication  Diabetes (A1c goal <7%) -Controlled - A1c 6.9% (10/23) at goal -Current medications: Metformin 500 mg - 1/2 tab daily - Appropriate, Effective, Safe, Accessible Jardiance 10 mg - 1/2 tab daily - Appropriate, Effective, Safe, Accessible Testing supplies -Educated on A1c and blood sugar goals; Complications of diabetes including kidney damage, retinal damage, and cardiovascular disease; benefits of metformin and Jardiance for DM and CV protection -Recommend to continue current medication  Heart Failure / Hypertension (Goal: BP < 140/90) -Controlled - pt reports compliance with daily furosemide and weight is stable at home -Dry weight is ~160# now -pt has been advised to take extra furosemide 20 mg for wt gain 3+ lbs -Current home BP/HR readings: SBP 120s -Hx frequent PVCs controlled with amiodarone -Last ejection fraction: 50-55% (Date: 01/28/22); previously 25-30% (2018) -HF type: HFimpEF (EF improved from <40% to > 40%); NYHA Class: II (slight actitivty limitation) -Current treatment: Carvedilol 25 mg BID - Appropriate, Effective, Safe, Accessible Furosemide 20 mg daily -Appropriate, Effective, Safe, Accessible Entresto 97-103 mg BID (PAP) -Appropriate, Effective, Safe, Accessible Jardiance 10 mg -1/2 tab daily (Fatima Sanger -Appropriate, Effective, Safe, Accessible Amiodarone 200 mg -1/2 tab 3x weekly -Appropriate, Effective, Safe, Accessible -Medications previously tried: spironolactone (hyperK)  -Educated on Benefits of medications for managing symptoms and prolonging life; Importance of blood pressure control; proper BP monitoring technique -Reviewed importance of daily weights and discussed calling cardiology with wt gain > 3lbs overnight  or 5+ lbs in a week -Recommend to continue current medication  ILD / chronic DOE (Goal: manage symptoms) -Not ideally  controlled -Current treatment  Combivent PRN - Appropriate, Query Effective Trelegy (sample given 10/10/22) - not started -Medications previously tried: n/a  -Counseled on Differences between maintenance and rescue inhalers - discussed pulmonologist had given Trelegy to see if it would help her chronic DOE, if it does not help she can stop it -Advised trial of Trelegy, monitor breathing symptoms closely for improvement  Health Maintenance -Vaccine gaps: TDAP -discussed benefits/risks of TDAP booster, pt is low risk for TD and would like to forego 10-year booster  Patient Goals/Self-Care Activities Patient will:  - take medications as prescribed -focus on medication adherence by pill box -check blood pressure 3x weekly -collaborate with provider on medication access solutions Vania Rea, Delene Loll)      Medication Assistance:  Delene Loll - [employer] PAP approved through cardiology until 12/11/21 -[place] 01/09/22 - 01/08/23  ID: [id number] BIN: [id number] PCN: [id number] GRP: [id number]  Compliance/Adherence/Medication fill history: Care Gaps: Eye exam UACR  Star-Rating Drugs: Rosuvastatin - PDC 98% Metformin - PDC 83% Jardiance - PDC 100%  Medication Access: Within the past 30 days, how often has patient missed a dose of medication? 0 Is a pillbox or other method used to improve adherence? Yes  Factors that may affect medication adherence? financial need Are meds synced by current pharmacy? No  Are meds delivered by current pharmacy? No  Does patient experience delays in picking up medications due to transportation concerns? No   [facility] Services Reviewed: Is patient disadvantaged to use [facility]?: No  Current Rx insurance plan: [facility] [place] Name and location of Current pharmacy:  [facility] [place], [place] - [address] [place] [address] Phone: [phone] Fax:  [phone]  [facility] services reviewed with patient today?: No  Patient requests to transfer care to [facility]?: No  Reason patient declined to change pharmacies: Hesitance/Possibly interested in future   Care Plan and Follow Up Patient Decision:  Patient agrees to Care Plan and Follow-up.  Plan: Telephone follow up appointment with care management team member scheduled for:  6 months  Charlene Brooke, PharmD, [facility] [address] Clinical Pharmacist [facility] Primary Care [phone]

## 2022-10-24 ENCOUNTER — Other Ambulatory Visit: Payer: Self-pay

## 2022-10-24 MED ORDER — ENTRESTO 97-103 MG PO TABS: 1.0000 | ORAL_TABLET | Freq: Two times a day (BID) | ORAL | 3 refills | Status: DC

## 2022-10-26 DIAGNOSIS — G4733 Obstructive sleep apnea (adult) (pediatric): Secondary | ICD-10-CM | POA: Diagnosis not present

## 2022-10-28 ENCOUNTER — Encounter: Payer: Self-pay | Admitting: Family Medicine

## 2022-10-31 ENCOUNTER — Telehealth: Payer: Medicare Other

## 2022-10-31 ENCOUNTER — Telehealth: Payer: Self-pay | Admitting: Family Medicine

## 2022-10-31 NOTE — Telephone Encounter (Signed)
Please call pt, verify recent weights and see if she has checked her BP.  If so, then let me know.  If not, then see if she can do that and report back.  Is she lightheaded?  Please let me know.    Please check on her recent lasix use/dose.    Thanks.

## 2022-11-01 NOTE — Telephone Encounter (Signed)
If her weight is stable, then I would continue lasix as is.  If lightheaded or if weight is 154 or below, then I would skip lasix for 1 day.  Reasonable to weight daily in the AM prior to breakfast.  Please set up OV for next week re: weight loss. Thanks.

## 2022-11-01 NOTE — Telephone Encounter (Signed)
Spoke with patient and she stated that her weight has been 156 for the past 5 days. BP has been running normal/good. Has not had any lightheadedness. She takes one lasix a day; has been doing this almost all year.

## 2022-11-01 NOTE — Telephone Encounter (Signed)
Patient notified. She will follow these instructions, and she repeated back to me that she understood. OV scheduled for 11/30 @ 4:00.

## 2022-11-06 ENCOUNTER — Ambulatory Visit
Admission: RE | Admit: 2022-11-06 | Discharge: 2022-11-06 | Disposition: A | Discharge status: Home / Self Care | Payer: Medicare Other | Source: Ambulatory Visit | Attending: Family Medicine | Admitting: Family Medicine

## 2022-11-06 DIAGNOSIS — N2889 Other specified disorders of kidney and ureter: Secondary | ICD-10-CM

## 2022-11-06 DIAGNOSIS — N281 Cyst of kidney, acquired: Secondary | ICD-10-CM | POA: Diagnosis not present

## 2022-11-06 DIAGNOSIS — K828 Other specified diseases of gallbladder: Secondary | ICD-10-CM | POA: Diagnosis not present

## 2022-11-06 MED ORDER — GADOPICLENOL 0.5 MMOL/ML IV SOLN
7.0000 mL | Freq: Once | INTRAVENOUS | Status: AC | PRN
  Administered 2022-11-06: 7 mL via INTRAVENOUS

## 2022-11-10 ENCOUNTER — Other Ambulatory Visit: Payer: Self-pay | Admitting: Cardiology

## 2022-11-10 ENCOUNTER — Encounter: Payer: Self-pay | Admitting: Family Medicine

## 2022-11-10 ENCOUNTER — Ambulatory Visit (INDEPENDENT_AMBULATORY_CARE_PROVIDER_SITE_OTHER): Payer: Medicare Other | Admitting: Family Medicine

## 2022-11-10 ENCOUNTER — Other Ambulatory Visit: Payer: Self-pay | Admitting: Family Medicine

## 2022-11-10 VITALS — BP 120/68 | HR 74 | Temp 97.3°F | Ht 62.0 in | Wt 163.0 lb

## 2022-11-10 DIAGNOSIS — I5032 Chronic diastolic (congestive) heart failure: Secondary | ICD-10-CM | POA: Diagnosis not present

## 2022-11-10 DIAGNOSIS — N289 Disorder of kidney and ureter, unspecified: Secondary | ICD-10-CM

## 2022-11-10 DIAGNOSIS — R059 Cough, unspecified: Secondary | ICD-10-CM | POA: Diagnosis not present

## 2022-11-10 LAB — POC COVID19 BINAXNOW: SARS Coronavirus 2 Ag: NEGATIVE

## 2022-11-10 MED ORDER — AMIODARONE HCL 200 MG PO TABS: ORAL_TABLET | ORAL | 3 refills | Status: DC

## 2022-11-10 NOTE — Patient Instructions (Addendum)
The kidney lesion is a benign/noncancerous cyst.  You do not need any extra follow-up imaging. You do not have to do anything about this.   Your lungs are clear and I would start using trelegy.  Let us know if you are not feeling better soon.  Rest and fluids in the meantime.  I suspect you have a virus (but not covid) that should gradually improve.   If you continue to lose weight then let us know but I wouldn't change your meds in the meantime.  If you are lightheaded or if your weight is below 158 in the AM, then I would skip lasix that day.    Please update me in a few days about your cough, weight and lasix use.   Take care.  Glad to see you.

## 2022-11-10 NOTE — Progress Notes (Signed)
D/w pt about MRI.  See AVS.  The kidney lesion is a benign/noncancerous cyst.  She does not need any extra follow-up imaging.  She does not need to do anything about this.  Cough started about 2 days ago.  Husband with cough, he was neg for covid test.  Hasn't started trelegy.  No fevers.  Occ sputum.  Taking mucinex.  No facial or ear pain.  No ST.  Some wheeze recently.    Weight loss.  April 172 lbs.  Today 163 lbs.  She is watching her diet but not trying to lose sig amount of weight.  Recent TSH wnl.  No black or bloody stools.  She was feeling fine prior to recent cough. No BLE edema.    Weight 161 lbs this AM.    Meds, vitals, and allergies reviewed.   ROS: Per HPI unless specifically indicated in ROS section   Nad Ncat Sinuses nontender to palpation. MMM Neck supple, no LA Rrr Ctab Abd soft, not ttp Ext well perfused.  She has some discolored sputum at the office visit.  She had some upper airway noises noted on exam prior to clearance of sputum.  The sputum was initially reddish-brown but then it cleared after she coughed up several samples.  COVID test negative.

## 2022-11-13 DIAGNOSIS — N179 Acute kidney failure, unspecified: Secondary | ICD-10-CM | POA: Insufficient documentation

## 2022-11-13 DIAGNOSIS — N289 Disorder of kidney and ureter, unspecified: Secondary | ICD-10-CM | POA: Insufficient documentation

## 2022-11-13 NOTE — Assessment & Plan Note (Signed)
No intervention needed based on imaging.

## 2022-11-13 NOTE — Assessment & Plan Note (Signed)
I suspect she has a benign viral illness that is not COVID.  She is still clearing sputum well and not short of breath.  Her lungs are clear.  Supportive care for now.  Update me if she does not gradually improve.  Routine cautions given to patient.

## 2022-11-13 NOTE — Assessment & Plan Note (Signed)
Some of the weight loss could be related to improved fluid clearance with treated heart failure.  Discussed options.  No change in meds at this point.  If she has progressive weight loss then I want her to let me know. If lightheaded or if AM weight is below 158 in the AM, then I would skip lasix that day.  Otherwise I would continue with Lasix for now.  See after visit summary.  She agrees.  30 minutes were devoted to patient care in this encounter (this includes time spent reviewing the patient's file/history, interviewing and examining the patient, counseling/reviewing plan with patient).

## 2022-11-15 ENCOUNTER — Telehealth: Payer: Self-pay | Admitting: Cardiology

## 2022-11-15 NOTE — Telephone Encounter (Signed)
  Pt c/o medication issue:  1. Name of Medication: sacubitril-valsartan (ENTRESTO) 97-103 MG   2. How are you currently taking this medication (dosage and times per day)?   Take 1 tablet by mouth 2 (two) times daily.    3. Are you having a reaction (difficulty breathing--STAT)? No   4. What is your medication issue? Pt would like to ask Dr. Herbie Baltimore nurse if she sent her application for pt assistance to [employer]. She said, she received a notification that [employer] has not receive it yet

## 2022-11-15 NOTE — Telephone Encounter (Signed)
Returned call to patient who states that she brought in her application for [employer] for [employer] around 2 weeks ago but states that the company has not received the application yet. Patient would like to know if this has been faxed over or not. Advised patient I would forward to [name], Charity fundraiser. Patient verbalized understanding.

## 2022-11-16 ENCOUNTER — Telehealth: Payer: Self-pay | Admitting: Family Medicine

## 2022-11-16 DIAGNOSIS — G4733 Obstructive sleep apnea (adult) (pediatric): Secondary | ICD-10-CM | POA: Diagnosis not present

## 2022-11-16 MED ORDER — TRAMADOL HCL 50 MG PO TABS: ORAL_TABLET | ORAL | 1 refills | Status: DC

## 2022-11-16 NOTE — Telephone Encounter (Signed)
Send #270 for 90 day supply.  Thanks.

## 2022-11-16 NOTE — Telephone Encounter (Signed)
error 

## 2022-11-16 NOTE — Addendum Note (Signed)
Addended by: [name] on: 11/16/2022 05:15 PM   Modules accepted: Orders

## 2022-11-16 NOTE — Telephone Encounter (Signed)
LOV - 11/10/22 NOV - N/A RF - 07/04/22 #90/5

## 2022-11-16 NOTE — Telephone Encounter (Signed)
Natasha Woodard from [facility] called in and was wanting to know if a 90 day supply of traMADol (ULTRAM) 50 MG tablet could be sent over to her pharmacy.   [facility] [phone] - [place], [place] - [address] AT [facility] [address] & [facility]

## 2022-11-16 NOTE — Telephone Encounter (Signed)
Joy from [employer] called and stated patient needs to 290 pills for a 90 day supply.

## 2022-11-18 NOTE — Telephone Encounter (Signed)
Pt is calling to get update on application. Requesting call back to get confirmation.

## 2022-11-20 ENCOUNTER — Encounter: Payer: Self-pay | Admitting: Family Medicine

## 2022-11-22 ENCOUNTER — Telehealth: Payer: Self-pay | Admitting: Family Medicine

## 2022-11-22 NOTE — Progress Notes (Unsigned)
    Natasha Venezia T. Mujtaba Bollig, MD, CAQ Sports Medicine [facility] at [facility] [address] [place], [address]  Phone: [phone]  FAX: [phone]  Natasha Woodard - 81 y.o. female  MRN [id number]  Date of Birth: 11-Jan-1941  Date: 11/23/2022  PCP: [name], MD  Referral: [name], MD  No chief complaint on file.  Subjective:   Natasha Woodard is a 81 y.o. very pleasant female patient with There is no height or weight on file to calculate BMI. who presents with the following:  Pleasant young 81 year old patient who presents with sinus congestion and a deep cough.    Review of Systems is noted in the HPI, as appropriate  Objective:   There were no vitals taken for this visit.  GEN: No acute distress; alert,appropriate. PULM: Breathing comfortably in no respiratory distress PSYCH: Normally interactive.   Laboratory and Imaging Data:  Assessment and Plan:   ***

## 2022-11-22 NOTE — Telephone Encounter (Signed)
Please triage patient.  Possible post infectious cough.  Thanks.

## 2022-11-22 NOTE — Telephone Encounter (Addendum)
I spoke with pt; pt said she is feeling OK except for lingering prod cough with thick, light white color. No fever, no CP or SOB. No wheezing.pt said neg covid test last week. Pt is not taking any OTC med for cough. Encouraged pt to drink plenty of fluids. Pt needed late in day appt and pt scheduled appt with Dr Patsy Lager 11/23/22 at 3:40 with UC & ED precautions.that pt voiced understanding. sending note to Dr Para March as Lorain Childes to PCP and Dr Patsy Lager and [place] pool.

## 2022-11-23 ENCOUNTER — Ambulatory Visit: Payer: Medicare Other | Admitting: Family Medicine

## 2022-11-23 ENCOUNTER — Encounter: Payer: Self-pay | Admitting: Family Medicine

## 2022-11-23 VITALS — BP 120/70 | HR 68 | Temp 98.6°F | Ht 62.0 in | Wt 161.0 lb

## 2022-11-23 DIAGNOSIS — E1151 Type 2 diabetes mellitus with diabetic peripheral angiopathy without gangrene: Secondary | ICD-10-CM

## 2022-11-23 DIAGNOSIS — J208 Acute bronchitis due to other specified organisms: Secondary | ICD-10-CM

## 2022-11-23 DIAGNOSIS — I5032 Chronic diastolic (congestive) heart failure: Secondary | ICD-10-CM

## 2022-11-23 DIAGNOSIS — N1831 Chronic kidney disease, stage 3a: Secondary | ICD-10-CM | POA: Diagnosis not present

## 2022-11-23 MED ORDER — DOXYCYCLINE HYCLATE 100 MG PO TABS: 100.0000 mg | ORAL_TABLET | Freq: Two times a day (BID) | ORAL | 0 refills | Status: DC

## 2022-11-23 NOTE — Telephone Encounter (Signed)
Noted. Thanks.

## 2022-11-25 ENCOUNTER — Telehealth: Payer: Self-pay | Admitting: Family Medicine

## 2022-11-25 DIAGNOSIS — G4733 Obstructive sleep apnea (adult) (pediatric): Secondary | ICD-10-CM | POA: Diagnosis not present

## 2022-11-25 NOTE — Telephone Encounter (Signed)
Patient states she is not happy with Dr. Maple Hudson and would like to leave [place] pulmonary office. She is requesting a referral be sent to another pulmonary office that Dr. Para March recommends. Would prefer [facility] but will come to [place] if needed.

## 2022-11-25 NOTE — Telephone Encounter (Signed)
Patient called to get a referral and she didn't  enclose why she stated Dr. Para March would know and requested a call back. Call back number [phone].

## 2022-11-27 NOTE — Telephone Encounter (Signed)
We can set her up in with [facility] if she wants to see another clinic.  Let me know if she wants that. If she is willing to discuss it, what was the issue with seeing Dr. Maple Hudson?

## 2022-11-28 NOTE — Telephone Encounter (Signed)
LMTCB

## 2022-11-29 ENCOUNTER — Ambulatory Visit: Payer: Medicare Other | Admitting: Family Medicine

## 2022-11-30 ENCOUNTER — Telehealth: Payer: Self-pay

## 2022-11-30 NOTE — Telephone Encounter (Signed)
Multiple good options here, not limited to but including:  Cyril Mourning, MD Levy Pupa, MD Audie Box, DO Chilton Greathouse, MD Max Fickle, MD Virl Diamond, MD Kalman Shan, MD Coralyn Helling, MD  I have confidence in all of the pulmonary docs.  Thanks.

## 2022-11-30 NOTE — Telephone Encounter (Signed)
Spoke with patient and she does not want to have to go to [place] for care. She is okay to stay with [place] pul[facility] as long as she does not have to see Dr. Maple Hudson. Patient states she just did not feel comfortable with him and he seemed like he did not want to talk about things or explain things to her. Wants to know who else you recommend in that practice?

## 2022-11-30 NOTE — Telephone Encounter (Signed)
Called pt she complains of boils in groin area and yeast infection.  She has been recently treated with antibiotic for respiratory infection.  Pt reports that she has been using warm compresses and salves on boils for several days with no relief.  She has made an appointment with Dr. Para March on 12/09/22 sooner appointment was offered for 12/02/22 @ 11:40a with Dr. Ermalene Searing.  Pt reported that in the past these boils have needed to be lanced.  I explained that Dr. Ermalene Searing would need to see her to make treatment decisions and pt verbalized understanding.  Encouraged her to continue the salve and warm compresses until she is seen on Friday.  Encouraged pt to call back if boils worsen.    [facility] - Client TELEPHONE ADVICE RECORD Access[employer]urse Patient Name: Natasha Woodard Gender: Female DOB: [birth year] Age: 81 Y 3 M 5 D Return Phone Number: [phone] (Primary) Address: City/ State/ Zip: [place]  [address] Client [facility] - Client Client Site [facility] - Day Provider Raechel Ache - MD Contact Type Call Who Is Calling Patient / Member / Family / Caregiver Call Type Triage / Clinical Relationship To Patient Self Return Phone Number [phone] (Primary) Chief Complaint Skin Lesion - Moles/ Lumps/ Growths Reason for Call Symptomatic / Request for Health Information Initial Comment Caller states she has boils that need to be lanced. States she is in pain from a yeast infection. Has had 7 days of medication but it hasn't worked. Was transferred from the office due to no appts today or tomorrow. Translation No Nurse Assessment Nurse: Mayford Knife, RN, Lupe Carney Date/Time Lamount Cohen Time): 11/30/2022 9:30:48 AM Confirm and document reason for call. If symptomatic, describe symptoms. ---Boils in vaginal area also having yeast infection symptoms. Has been on doxycycline for respiratory infection Does the  patient have any new or worsening symptoms? ---Yes Will a triage be completed? ---Yes Related visit to physician within the last 2 weeks? ---No Does the PT have any chronic conditions? (i.e. diabetes, asthma, this includes High risk factors for pregnancy, etc.) ---Yes List chronic conditions. ---diabetes, HTN Is this a behavioral health or substance abuse call? ---No Guidelines Guideline Title Affirmed Question Affirmed Notes Nurse Date/Time (Eastern Time) Vaginal Symptoms MODERATESEVERE itching (i.e., interferes with school, work, or sleep) Mayford Knife, RN, Lupe Carney 11/30/2022 9:32:45 AM PLEASE NOTE: All timestamps contained within this report are represented as [place] Standard Time. CONFIDENTIALTY NOTICE: This fax transmission is intended only for the addressee. It contains information that is legally privileged, confidential or otherwise protected from use or disclosure. If you are not the intended recipient, you are strictly prohibited from reviewing, disclosing, copying using or disseminating any of this information or taking any action in reliance on or regarding this information. If you have received this fax in error, please notify us immediately by telephone so that we can arrange for its return to [place]. Phone: [phone], Toll-Free: [phone], Fax: [phone] Page: 2 of 2 Call Id: [id number] Disp. Time Lamount Cohen Time) Disposition Final User 11/30/2022 9:11:17 AM Attempt made - message left Mayford Knife, RN, Lupe Carney 11/30/2022 9:41:43 AM See PCP within 24 Hours Yes Mayford Knife, RN, Lupe Carney Final Disposition 11/30/2022 9:41:43 AM See PCP within 24 Hours Yes Mayford Knife, RN, Lupe Carney Caller Disagree/Comply Comply Caller Understands Yes PreDisposition Call Doctor Care Advice Given Per Guideline SEE PCP WITHIN 24 HOURS: * IF OFFICE WILL BE OPEN: You need to be examined within the next 24 hours. Call your doctor (or  NP/PA) when the office opens and make an appointment. CALL BACK IF: * You become  worse CARE ADVICE given per Vaginal Symptoms (Adult) guideline. Referrals [facility] at [facility]

## 2022-11-30 NOTE — Telephone Encounter (Signed)
Noted. Thanks.

## 2022-12-01 ENCOUNTER — Ambulatory Visit (HOSPITAL_COMMUNITY): Payer: Self-pay

## 2022-12-01 NOTE — Telephone Encounter (Signed)
Spoke with patient and gave patient Dr. Lianne Bushy recommendations for pulmonary physicians.

## 2022-12-02 ENCOUNTER — Ambulatory Visit (INDEPENDENT_AMBULATORY_CARE_PROVIDER_SITE_OTHER): Payer: Medicare Other | Admitting: Family Medicine

## 2022-12-02 ENCOUNTER — Encounter: Payer: Self-pay | Admitting: Family Medicine

## 2022-12-02 VITALS — BP 130/80 | HR 78 | Temp 98.3°F | Ht 62.0 in | Wt 159.1 lb

## 2022-12-02 DIAGNOSIS — N907 Vulvar cyst: Secondary | ICD-10-CM

## 2022-12-02 DIAGNOSIS — B3731 Acute candidiasis of vulva and vagina: Secondary | ICD-10-CM

## 2022-12-02 MED ORDER — FLUCONAZOLE 150 MG PO TABS: 150.0000 mg | ORAL_TABLET | Freq: Once | ORAL | 0 refills | Status: AC

## 2022-12-02 NOTE — Patient Instructions (Signed)
Can continue warm compresses on cyst.. call for referral to [facility] f continuing to be very irritating.  Take diflucan now and again in 2-3 days if not feeling better.

## 2022-12-02 NOTE — Progress Notes (Signed)
Patient ID: Natasha Woodard, female    DOB: May 27, 1941, 81 y.o.   MRN: [id number]  This visit was conducted in person.  BP 130/80   Pulse 78   Temp 98.3 F (36.8 C) (Oral)   Ht 5\' 2"  (1.575 m)   Wt 159 lb 2 oz (72.2 kg)   SpO2 96%   BMI 29.10 kg/m    CC:  Chief Complaint  Patient presents with   Recurrent Skin Infections    Boil in Vaginal Area   Vaginitis    Vaginal Itching    Subjective:   HPI: Natasha Woodard is a 81 y.o. female  of [address] with history of  CHF, cardiomyopathy,  ILD, HTN, CKD and DM presenting on 12/02/2022 for Recurrent Skin Infections (Boil in Vaginal Area) and Vaginitis (Vaginal Itching)    She has been noting  2-3 lumps in  vluva/groin.12/04/2022 no pain present for 1-2 years.  Now in last week  there is 1-2 new spots, mildly sore, no discharge. She has been using OTC boil salve.  Doing warm compress daily, heating pad.  Seen by Dr Marland Kitchen on 12/13 for acute bronchitis.1/14 Has been on doxycycline.. since starting that she has had  vaginal itching, no discharge.  Has tried monistat 7... helped slightly.  She feels that since starting the doxycycline is when she started having more of the spots in her vagina appear.  Hx of ? boil years ago.. no history of MRSA known     Relevant past medical, surgical, family and social history reviewed and updated as indicated. Interim medical history since our last visit reviewed. Allergies and medications reviewed and updated. Outpatient Medications Prior to Visit  Medication Sig Dispense Refill   acetaminophen (TYLENOL) 500 MG tablet Take 500 mg by mouth every 6 (six) hours as needed.     amiodarone (PACERONE) 200 MG tablet TAKE ONE-HALF TABLET BY MOUTH EVERY TUES/THURS/SAT 90 tablet 3   aspirin 81 MG tablet Take 1 tablet (81 mg total) by mouth daily.     carvedilol (COREG) 25 MG tablet TAKE 1 TABLET BY MOUTH TWICE DAILY 180 tablet 1   Cholecalciferol (VITAMIN D) 1000 UNITS capsule Take 1,000 Units by mouth  daily.     doxycycline (VIBRA-TABS) 100 MG tablet Take 1 tablet (100 mg total) by mouth 2 (two) times daily. 20 tablet 0   furosemide (LASIX) 20 MG tablet Take 1 tablet (20 mg total) by mouth daily. 90 tablet 3   glucose blood (ACCU-CHEK AVIVA PLUS) test strip USE TO TEST BLOOD SUGAR ONCE DAILY FOR DM E11.9 100 each 1   JARDIANCE 10 MG TABS tablet TAKE 1/2 TABLET(5 MG) BY MOUTH DAILY 15 tablet 6   Lancets (ACCU-CHEK MULTICLIX) lancets USE ONE LANCET TO TEST BLOOD GLUCOSE DAILY FOR DM 250.00 102 each 3   metFORMIN (GLUCOPHAGE) 500 MG tablet TAKE 1/2 TABLET BY MOUTH DAILY WITH A MEAL 90 tablet 0   rosuvastatin (CRESTOR) 10 MG tablet TAKE 1 TABLET(10 MG) BY MOUTH DAILY 90 tablet 3   sacubitril-valsartan (ENTRESTO) 97-103 MG Take 1 tablet by mouth 2 (two) times daily. 180 tablet 3   traMADol (ULTRAM) 50 MG tablet TAKE 1 TABLET(50 MG) BY MOUTH THREE TIMES DAILY AS NEEDED 270 tablet 1   Fluticasone-Umeclidin-Vilant (TRELEGY ELLIPTA) 100-62.5-25 MCG/ACT AEPB Inhale 1 puff into the lungs daily. (Patient not taking: Reported on 12/02/2022) 2 each 0   COMBIVENT RESPIMAT 20-100 MCG/ACT AERS respimat INHALE 1 PUFF INTO THE LUNGS EVERY 6  HOURS AS NEEDED FOR WHEEZING OR SHORTNESS OF BREATH 4 g 1   nystatin (MYCOSTATIN/NYSTOP) powder Apply topically 2 (two) times daily. 30 g 1   nystatin cream (MYCOSTATIN) Apply 1 Application topically 2 (two) times daily. 30 g 1   No facility-administered medications prior to visit.     Per HPI unless specifically indicated in ROS section below Review of Systems  Constitutional:  Negative for fatigue and fever.  HENT:  Negative for congestion.   Eyes:  Negative for pain.  Respiratory:  Negative for cough and shortness of breath.   Cardiovascular:  Negative for chest pain, palpitations and leg swelling.  Gastrointestinal:  Negative for abdominal pain.  Genitourinary:  Negative for dysuria and vaginal bleeding.  Musculoskeletal:  Negative for back pain.  Neurological:   Negative for syncope, light-headedness and headaches.  Psychiatric/Behavioral:  Negative for dysphoric mood.    Objective:  BP 130/80   Pulse 78   Temp 98.3 F (36.8 C) (Oral)   Ht 5\' 2"  (1.575 m)   Wt 159 lb 2 oz (72.2 kg)   SpO2 96%   BMI 29.10 kg/m   Wt Readings from Last 3 Encounters:  12/02/22 159 lb 2 oz (72.2 kg)  11/23/22 161 lb (73 kg)  11/10/22 163 lb (73.9 kg)      Physical Exam Constitutional:      General: She is not in acute distress.    Appearance: Normal appearance. She is well-developed. She is not ill-appearing or toxic-appearing.  HENT:     Head: Normocephalic.     Right Ear: Hearing, tympanic membrane, ear canal and external ear normal. Tympanic membrane is not erythematous, retracted or bulging.     Left Ear: Hearing, tympanic membrane, ear canal and external ear normal. Tympanic membrane is not erythematous, retracted or bulging.     Nose: No mucosal edema or rhinorrhea.     Right Sinus: No maxillary sinus tenderness or frontal sinus tenderness.     Left Sinus: No maxillary sinus tenderness or frontal sinus tenderness.     Mouth/Throat:     Pharynx: Uvula midline.  Eyes:     General: Lids are normal. Lids are everted, no foreign bodies appreciated.     Conjunctiva/sclera: Conjunctivae normal.     Pupils: Pupils are equal, round, and reactive to light.  Neck:     Thyroid: No thyroid mass or thyromegaly.     Vascular: No carotid bruit.     Trachea: Trachea normal.  Cardiovascular:     Rate and Rhythm: Normal rate and regular rhythm.     Pulses: Normal pulses.     Heart sounds: Normal heart sounds, S1 normal and S2 normal. No murmur heard.    No friction rub. No gallop.  Pulmonary:     Effort: Pulmonary effort is normal. No tachypnea or respiratory distress.     Breath sounds: Normal breath sounds. No decreased breath sounds, wheezing, rhonchi or rales.  Abdominal:     General: Bowel sounds are normal.     Palpations: Abdomen is soft.      Tenderness: There is no abdominal tenderness.  Genitourinary:      Comments: Two firm whitish appearing cystlike structures without erythema or increased warmth noted as marked above.  Minimally tender to palpation Evidence of vaginal irritation and erythema likely from yeast infection, no discharge, vaginal tissues very dry Musculoskeletal:     Cervical back: Normal range of motion and neck supple.  Skin:    General: Skin is warm  and dry.     Findings: No rash.  Neurological:     Mental Status: She is alert.  Psychiatric:        Mood and Affect: Mood is not anxious or depressed.        Speech: Speech normal.        Behavior: Behavior normal. Behavior is cooperative.        Thought Content: Thought content normal.        Judgment: Judgment normal.       Results for orders placed or performed in visit on 11/10/22  POC COVID-19  Result Value Ref Range   SARS Coronavirus 2 Ag Negative Negative     COVID 19 screen:  No recent travel or known exposure to COVID19 The patient denies respiratory symptoms of COVID 19 at this time. The importance of social distancing was discussed today.   Assessment and Plan    Problem List Items Addressed This Visit     Vaginal candida - Primary   Relevant Medications   fluconazole (DIFLUCAN) 150 MG tablet   Vulvar cysts   Current exam suggests long-term present vulvar cyst now recently irritated but not infected.  Of note she is currently on doxycycline which would have covered for bacterial infection as opposed to worsen.  I believe her current symptoms are due to the current vaginal Candida and resulting irritation of the entire area. Prior to this last week she states the cyst had never bothered her.  Recommend continued compresses and Diflucan 150 mg p.o. x 1 now and repeat 2 to 3 days later. We discussed if the cysts remain irritated she can consider referral to gynecology for possible removal.  Kerby Nora, MD

## 2022-12-08 ENCOUNTER — Telehealth: Payer: Self-pay | Admitting: Internal Medicine

## 2022-12-08 NOTE — Telephone Encounter (Signed)
Hey ladies,   Can we call this patient again to set her up for a new pt appointment with Dr Val Eagle.  Thank you

## 2022-12-08 NOTE — Telephone Encounter (Signed)
Sure - fine with me

## 2022-12-08 NOTE — Telephone Encounter (Signed)
Patient is scheduled with AO on 2/14 at 3pm- reminder mailed. Nothing further needed.

## 2022-12-08 NOTE — Telephone Encounter (Signed)
Patient stated she received a call from [employer] saying they had not received her application for Entresto.  Patient would like a call back from RN [name] to confirm the status of her application.

## 2022-12-08 NOTE — Telephone Encounter (Signed)
Will route to RN to review paperwork.   Thanks!

## 2022-12-08 NOTE — Telephone Encounter (Signed)
Called patient and she states that she is wanting to switch providers. Patient states she normally sees Dr Maple Hudson. She is wanting to start seeing Dr Wynona Neat if possible.  Dr Maple Hudson are you ok with this change  Dr Wynona Neat are you ok with this change  Please advise   Thank you

## 2022-12-08 NOTE — Telephone Encounter (Signed)
Okay with me 

## 2022-12-09 ENCOUNTER — Ambulatory Visit: Payer: Medicare Other | Admitting: Family Medicine

## 2022-12-09 NOTE — Telephone Encounter (Signed)
Clarification information was faxed 12/01/22   Called patient no answer., left message   Will refax again - only received 1 page the "prescriber application "

## 2022-12-09 NOTE — Telephone Encounter (Signed)
Patient assistance sent 12/02/22

## 2022-12-26 DIAGNOSIS — G4733 Obstructive sleep apnea (adult) (pediatric): Secondary | ICD-10-CM | POA: Diagnosis not present

## 2023-01-03 ENCOUNTER — Other Ambulatory Visit: Payer: Self-pay | Admitting: Family Medicine

## 2023-01-04 ENCOUNTER — Telehealth: Payer: Self-pay | Admitting: Family Medicine

## 2023-01-04 ENCOUNTER — Encounter: Payer: Self-pay | Admitting: Family Medicine

## 2023-01-04 MED ORDER — COMBIVENT RESPIMAT 20-100 MCG/ACT IN AERS: 1.0000 | INHALATION_SPRAY | Freq: Four times a day (QID) | RESPIRATORY_TRACT | 0 refills | Status: DC | PRN

## 2023-01-04 NOTE — Telephone Encounter (Signed)
See my chart message

## 2023-01-04 NOTE — Telephone Encounter (Signed)
Refill request for  TRAMADOL 50MG  TABLETS   LOV - 12/02/22 Next OV - not scheduled Last refill - 11/16/22 #270/1

## 2023-01-04 NOTE — Telephone Encounter (Signed)
Agree with leg elevation. Ensure limiting salt/sodium intake to prevent leg swelling. Ensure no chest pain.  Combivent refilled. Agree with trying Trelegy sample from pulm to see if helps breathing.  Keep pulm f/u 2/1. Let us know if any worsening symptoms.

## 2023-01-04 NOTE — Telephone Encounter (Signed)
Prescription Request  01/04/2023  Is this a "Controlled Substance" medicine? No  LOV: 11/10/2022  What is the name of the medication or equipment? Combivent Respimat 20-100 MCG/ACT AERS respimat  Have you contacted your pharmacy to request a refill? No   Which pharmacy would you like this sent to?  [facility] [phone] - [place], [place] [facility] [address] AT [place] [facility] [address] Phone: [phone] Fax: [phone]    Patient notified that their request is being sent to the clinical staff for review and that they should receive a response within 2 business days.   Please advise at Mobile [phone] (mobile)  Comment: Patient stated that she is needing this refill today. She was wondering if another provider could send this in. She will like a call once it is sent in. Thank you!

## 2023-01-04 NOTE — Telephone Encounter (Signed)
Please check with pharmacy.  Was written for 90 day/#270 supply with 1 refill on 11/16/22.  She should have refill on file.   If the pharmacy only fills 90 per rx, then please let me know so I can change the rx sig.  Thanks.

## 2023-01-04 NOTE — Telephone Encounter (Signed)
Spoke to patient by telephone and was advised that she had some swelling in her left ankle and leg for about 5-6 days. Patient stated that the swelling is now gone. Patient was advised that she should elevate her legs and feet when sitting around. Patient stated that she never does that. Patient stated that she has been having SOB for way over a month. Patient stated that she saw a pulmonologist and was given samples of Trelegy but has never used it. Patient stated when she was using the Combivent inhaler that helped with the SOB but she is out of it now. Patient stated that she has a follow-up scheduled with pulmonology 01/12/23. Patient was advised if she had been using the Trelegy she may not be having SOB now. Patient wants to know if she should start the Trelegy now?

## 2023-01-04 NOTE — Telephone Encounter (Signed)
Patient notified as instructed by telephone and verbalized understanding. Patient denies any chest pain. Patient stated that she appreciates the call back and will follow directions.

## 2023-01-06 ENCOUNTER — Telehealth: Payer: Self-pay | Admitting: Pulmonary Disease

## 2023-01-06 NOTE — Telephone Encounter (Signed)
Spoke to patient, I offered her a sooner apt with Dr.Young pt declined. I advised no one had a sooner appointment. She stated she will wait to see Dr. Ander Slade on Thursday. I told her she can always call back Monday to see if anyone has canceled. I advised her in the meantime to call [employer] so they can try and help with her machine. She verbalized understanding. NFN

## 2023-01-06 NOTE — Telephone Encounter (Signed)
PT calling. Has an appt next Thurs. Pls call. She is having issues with her Cpap and wonders if there is anything she can do before the appt to provide relife. [employer] told her to call us.   Her # is [phone]  No earlier appts avail

## 2023-01-09 ENCOUNTER — Telehealth: Payer: Self-pay | Admitting: Pulmonary Disease

## 2023-01-09 NOTE — Telephone Encounter (Signed)
This sounds as if it may be fluid retention from her heart.  Suggest she try taking an extra lasix tablet daily x 3 days- see if that helps.

## 2023-01-09 NOTE — Telephone Encounter (Signed)
Spoke with the pt She is c/o increased SOB over the past 2 wks  She states that she is SOB walking any distance, and also when she lies down  She is having to sleep propped up  She is not coughing or wheezing  No fevers, aches  A few days ago noted some increased swelling in feet, better now with watching her salt intake  She is using her trelegy daily and also combivent  She has appt with Dr Ander Slade to establish care on 01/12/23  She is asking for something to get her through until this visit  Please advise, thanks!  Allergies  Allergen Reactions   Ezetimibe-Simvastatin Other (See Comments)    REACTION: Muscle aches (side effect)   Lipitor [Atorvastatin] Other (See Comments)    Leg weakness    Claritin [Loratadine]     Possible cause of nightmares.     Spironolactone     Elevated potassium/creatinine   Tamiflu [Oseltamivir Phosphate] Other (See Comments)    nightmares   Pravastatin Sodium Other (See Comments)    REACTION: Muscle aches (side effect)   Sulfonamide Derivatives Nausea And Vomiting   Current Outpatient Medications on File Prior to Visit  Medication Sig Dispense Refill   acetaminophen (TYLENOL) 500 MG tablet Take 500 mg by mouth every 6 (six) hours as needed.     amiodarone (PACERONE) 200 MG tablet TAKE ONE-HALF TABLET BY MOUTH EVERY TUES/THURS/SAT 90 tablet 3   aspirin 81 MG tablet Take 1 tablet (81 mg total) by mouth daily.     carvedilol (COREG) 25 MG tablet TAKE 1 TABLET BY MOUTH TWICE DAILY 180 tablet 1   Cholecalciferol (VITAMIN D) 1000 UNITS capsule Take 1,000 Units by mouth daily.     doxycycline (VIBRA-TABS) 100 MG tablet Take 1 tablet (100 mg total) by mouth 2 (two) times daily. 20 tablet 0   Fluticasone-Umeclidin-Vilant (TRELEGY ELLIPTA) 100-62.5-25 MCG/ACT AEPB Inhale 1 puff into the lungs daily. (Patient not taking: Reported on 12/02/2022) 2 each 0   furosemide (LASIX) 20 MG tablet Take 1 tablet (20 mg total) by mouth daily. 90 tablet 3   glucose blood  (ACCU-CHEK AVIVA PLUS) test strip USE TO TEST BLOOD SUGAR ONCE DAILY FOR DM E11.9 100 each 1   Ipratropium-Albuterol (COMBIVENT RESPIMAT) 20-100 MCG/ACT AERS respimat Inhale 1 puff into the lungs every 6 (six) hours as needed for wheezing or shortness of breath. 4 g 0   JARDIANCE 10 MG TABS tablet TAKE 1/2 TABLET(5 MG) BY MOUTH DAILY 15 tablet 6   Lancets (ACCU-CHEK MULTICLIX) lancets USE ONE LANCET TO TEST BLOOD GLUCOSE DAILY FOR DM 250.00 102 each 3   metFORMIN (GLUCOPHAGE) 500 MG tablet TAKE 1/2 TABLET BY MOUTH DAILY WITH A MEAL 90 tablet 0   rosuvastatin (CRESTOR) 10 MG tablet TAKE 1 TABLET(10 MG) BY MOUTH DAILY 90 tablet 3   sacubitril-valsartan (ENTRESTO) 97-103 MG Take 1 tablet by mouth 2 (two) times daily. 180 tablet 3   traMADol (ULTRAM) 50 MG tablet TAKE 1 TABLET(50 MG) BY MOUTH THREE TIMES DAILY AS NEEDED 270 tablet 1   No current facility-administered medications on file prior to visit.

## 2023-01-09 NOTE — Telephone Encounter (Signed)
Spoke with the pt and notified of response per Dr Young  °She verbalized understanding  °Nothing further needed °

## 2023-01-09 NOTE — Telephone Encounter (Signed)
Spoke with patient she states she is not currently on 02. She has called [facility] in regards to her cpap machine. She states she will wait till Thursday to discuss CPAP with Dr. Ander Slade. Nothing further needed.

## 2023-01-09 NOTE — Telephone Encounter (Signed)
[facility] letting us know she keeps calling them to increase her O2. Adv them I would let Triage know.

## 2023-01-10 ENCOUNTER — Observation Stay (HOSPITAL_COMMUNITY): Payer: Medicare Other

## 2023-01-10 ENCOUNTER — Emergency Department (HOSPITAL_COMMUNITY): Payer: Medicare Other

## 2023-01-10 ENCOUNTER — Emergency Department (HOSPITAL_BASED_OUTPATIENT_CLINIC_OR_DEPARTMENT_OTHER): Payer: Medicare Other

## 2023-01-10 ENCOUNTER — Inpatient Hospital Stay (HOSPITAL_COMMUNITY)
Admission: EM | Admit: 2023-01-10 | Discharge: 2023-02-19 | DRG: 275 | Disposition: A | Discharge status: Rehabilitation Facility | Payer: Medicare Other | Attending: Internal Medicine | Admitting: Internal Medicine

## 2023-01-10 ENCOUNTER — Inpatient Hospital Stay (HOSPITAL_COMMUNITY)
Admission: EM | Disposition: A | Discharge status: Rehabilitation Facility | Payer: Self-pay | Source: Home / Self Care | Attending: Cardiology

## 2023-01-10 ENCOUNTER — Other Ambulatory Visit: Payer: Self-pay

## 2023-01-10 DIAGNOSIS — E1122 Type 2 diabetes mellitus with diabetic chronic kidney disease: Secondary | ICD-10-CM | POA: Diagnosis present

## 2023-01-10 DIAGNOSIS — I97630 Postprocedural hematoma of a circulatory system organ or structure following a cardiac catheterization: Secondary | ICD-10-CM | POA: Diagnosis not present

## 2023-01-10 DIAGNOSIS — Z95810 Presence of automatic (implantable) cardiac defibrillator: Secondary | ICD-10-CM | POA: Diagnosis not present

## 2023-01-10 DIAGNOSIS — I4901 Ventricular fibrillation: Secondary | ICD-10-CM | POA: Diagnosis not present

## 2023-01-10 DIAGNOSIS — G4733 Obstructive sleep apnea (adult) (pediatric): Secondary | ICD-10-CM | POA: Diagnosis not present

## 2023-01-10 DIAGNOSIS — I255 Ischemic cardiomyopathy: Secondary | ICD-10-CM | POA: Diagnosis not present

## 2023-01-10 DIAGNOSIS — Z90722 Acquired absence of ovaries, bilateral: Secondary | ICD-10-CM

## 2023-01-10 DIAGNOSIS — B965 Pseudomonas (aeruginosa) (mallei) (pseudomallei) as the cause of diseases classified elsewhere: Secondary | ICD-10-CM | POA: Diagnosis not present

## 2023-01-10 DIAGNOSIS — I5043 Acute on chronic combined systolic (congestive) and diastolic (congestive) heart failure: Secondary | ICD-10-CM | POA: Diagnosis not present

## 2023-01-10 DIAGNOSIS — T81718A Complication of other artery following a procedure, not elsewhere classified, initial encounter: Secondary | ICD-10-CM | POA: Diagnosis not present

## 2023-01-10 DIAGNOSIS — R0789 Other chest pain: Secondary | ICD-10-CM | POA: Diagnosis not present

## 2023-01-10 DIAGNOSIS — I462 Cardiac arrest due to underlying cardiac condition: Secondary | ICD-10-CM | POA: Diagnosis not present

## 2023-01-10 DIAGNOSIS — N179 Acute kidney failure, unspecified: Secondary | ICD-10-CM | POA: Diagnosis not present

## 2023-01-10 DIAGNOSIS — E8721 Acute metabolic acidosis: Secondary | ICD-10-CM | POA: Diagnosis present

## 2023-01-10 DIAGNOSIS — J45909 Unspecified asthma, uncomplicated: Secondary | ICD-10-CM | POA: Diagnosis not present

## 2023-01-10 DIAGNOSIS — B961 Klebsiella pneumoniae [K. pneumoniae] as the cause of diseases classified elsewhere: Secondary | ICD-10-CM | POA: Diagnosis not present

## 2023-01-10 DIAGNOSIS — I4721 Torsades de pointes: Secondary | ICD-10-CM | POA: Diagnosis not present

## 2023-01-10 DIAGNOSIS — I472 Ventricular tachycardia, unspecified: Secondary | ICD-10-CM | POA: Diagnosis not present

## 2023-01-10 DIAGNOSIS — Z809 Family history of malignant neoplasm, unspecified: Secondary | ICD-10-CM

## 2023-01-10 DIAGNOSIS — R570 Cardiogenic shock: Secondary | ICD-10-CM | POA: Diagnosis not present

## 2023-01-10 DIAGNOSIS — I249 Acute ischemic heart disease, unspecified: Secondary | ICD-10-CM | POA: Diagnosis not present

## 2023-01-10 DIAGNOSIS — J9601 Acute respiratory failure with hypoxia: Secondary | ICD-10-CM | POA: Diagnosis not present

## 2023-01-10 DIAGNOSIS — K7689 Other specified diseases of liver: Secondary | ICD-10-CM | POA: Diagnosis not present

## 2023-01-10 DIAGNOSIS — I480 Paroxysmal atrial fibrillation: Secondary | ICD-10-CM | POA: Diagnosis present

## 2023-01-10 DIAGNOSIS — I2121 ST elevation (STEMI) myocardial infarction involving left circumflex coronary artery: Principal | ICD-10-CM | POA: Diagnosis present

## 2023-01-10 DIAGNOSIS — N1832 Chronic kidney disease, stage 3b: Secondary | ICD-10-CM | POA: Diagnosis not present

## 2023-01-10 DIAGNOSIS — R0989 Other specified symptoms and signs involving the circulatory and respiratory systems: Secondary | ICD-10-CM | POA: Diagnosis not present

## 2023-01-10 DIAGNOSIS — R5381 Other malaise: Secondary | ICD-10-CM | POA: Diagnosis not present

## 2023-01-10 DIAGNOSIS — I214 Non-ST elevation (NSTEMI) myocardial infarction: Secondary | ICD-10-CM | POA: Diagnosis present

## 2023-01-10 DIAGNOSIS — Z9989 Dependence on other enabling machines and devices: Secondary | ICD-10-CM | POA: Diagnosis not present

## 2023-01-10 DIAGNOSIS — I2510 Atherosclerotic heart disease of native coronary artery without angina pectoris: Secondary | ICD-10-CM

## 2023-01-10 DIAGNOSIS — T8189XA Other complications of procedures, not elsewhere classified, initial encounter: Secondary | ICD-10-CM | POA: Diagnosis not present

## 2023-01-10 DIAGNOSIS — I5023 Acute on chronic systolic (congestive) heart failure: Secondary | ICD-10-CM | POA: Diagnosis not present

## 2023-01-10 DIAGNOSIS — N281 Cyst of kidney, acquired: Secondary | ICD-10-CM | POA: Diagnosis not present

## 2023-01-10 DIAGNOSIS — I452 Bifascicular block: Secondary | ICD-10-CM | POA: Diagnosis present

## 2023-01-10 DIAGNOSIS — E440 Moderate protein-calorie malnutrition: Secondary | ICD-10-CM | POA: Diagnosis not present

## 2023-01-10 DIAGNOSIS — N170 Acute kidney failure with tubular necrosis: Secondary | ICD-10-CM | POA: Diagnosis not present

## 2023-01-10 DIAGNOSIS — Z6833 Body mass index (BMI) 33.0-33.9, adult: Secondary | ICD-10-CM

## 2023-01-10 DIAGNOSIS — I340 Nonrheumatic mitral (valve) insufficiency: Secondary | ICD-10-CM | POA: Diagnosis present

## 2023-01-10 DIAGNOSIS — Y838 Other surgical procedures as the cause of abnormal reaction of the patient, or of later complication, without mention of misadventure at the time of the procedure: Secondary | ICD-10-CM | POA: Diagnosis not present

## 2023-01-10 DIAGNOSIS — S31109A Unspecified open wound of abdominal wall, unspecified quadrant without penetration into peritoneal cavity, initial encounter: Secondary | ICD-10-CM | POA: Diagnosis not present

## 2023-01-10 DIAGNOSIS — D62 Acute posthemorrhagic anemia: Secondary | ICD-10-CM | POA: Diagnosis not present

## 2023-01-10 DIAGNOSIS — Y840 Cardiac catheterization as the cause of abnormal reaction of the patient, or of later complication, without mention of misadventure at the time of the procedure: Secondary | ICD-10-CM | POA: Diagnosis not present

## 2023-01-10 DIAGNOSIS — N183 Chronic kidney disease, stage 3 unspecified: Secondary | ICD-10-CM | POA: Diagnosis not present

## 2023-01-10 DIAGNOSIS — B3731 Acute candidiasis of vulva and vagina: Secondary | ICD-10-CM | POA: Diagnosis not present

## 2023-01-10 DIAGNOSIS — Z7982 Long term (current) use of aspirin: Secondary | ICD-10-CM

## 2023-01-10 DIAGNOSIS — J9811 Atelectasis: Secondary | ICD-10-CM | POA: Diagnosis not present

## 2023-01-10 DIAGNOSIS — I272 Pulmonary hypertension, unspecified: Secondary | ICD-10-CM | POA: Diagnosis not present

## 2023-01-10 DIAGNOSIS — T8141XA Infection following a procedure, superficial incisional surgical site, initial encounter: Secondary | ICD-10-CM | POA: Diagnosis not present

## 2023-01-10 DIAGNOSIS — E876 Hypokalemia: Secondary | ICD-10-CM | POA: Diagnosis not present

## 2023-01-10 DIAGNOSIS — Z90710 Acquired absence of both cervix and uterus: Secondary | ICD-10-CM

## 2023-01-10 DIAGNOSIS — I509 Heart failure, unspecified: Secondary | ICD-10-CM | POA: Diagnosis not present

## 2023-01-10 DIAGNOSIS — I700 Atherosclerosis of aorta: Secondary | ICD-10-CM | POA: Diagnosis not present

## 2023-01-10 DIAGNOSIS — B9689 Other specified bacterial agents as the cause of diseases classified elsewhere: Secondary | ICD-10-CM | POA: Diagnosis not present

## 2023-01-10 DIAGNOSIS — I724 Aneurysm of artery of lower extremity: Secondary | ICD-10-CM | POA: Diagnosis not present

## 2023-01-10 DIAGNOSIS — I110 Hypertensive heart disease with heart failure: Secondary | ICD-10-CM | POA: Diagnosis not present

## 2023-01-10 DIAGNOSIS — I130 Hypertensive heart and chronic kidney disease with heart failure and stage 1 through stage 4 chronic kidney disease, or unspecified chronic kidney disease: Secondary | ICD-10-CM | POA: Diagnosis not present

## 2023-01-10 DIAGNOSIS — E1151 Type 2 diabetes mellitus with diabetic peripheral angiopathy without gangrene: Secondary | ICD-10-CM | POA: Diagnosis not present

## 2023-01-10 DIAGNOSIS — I25110 Atherosclerotic heart disease of native coronary artery with unstable angina pectoris: Secondary | ICD-10-CM | POA: Diagnosis present

## 2023-01-10 DIAGNOSIS — B964 Proteus (mirabilis) (morganii) as the cause of diseases classified elsewhere: Secondary | ICD-10-CM | POA: Diagnosis not present

## 2023-01-10 DIAGNOSIS — Z9079 Acquired absence of other genital organ(s): Secondary | ICD-10-CM

## 2023-01-10 DIAGNOSIS — I252 Old myocardial infarction: Secondary | ICD-10-CM | POA: Diagnosis not present

## 2023-01-10 DIAGNOSIS — Z87891 Personal history of nicotine dependence: Secondary | ICD-10-CM | POA: Diagnosis not present

## 2023-01-10 DIAGNOSIS — I5022 Chronic systolic (congestive) heart failure: Secondary | ICD-10-CM | POA: Insufficient documentation

## 2023-01-10 DIAGNOSIS — R079 Chest pain, unspecified: Secondary | ICD-10-CM | POA: Diagnosis not present

## 2023-01-10 DIAGNOSIS — I495 Sick sinus syndrome: Secondary | ICD-10-CM | POA: Diagnosis not present

## 2023-01-10 DIAGNOSIS — Z7984 Long term (current) use of oral hypoglycemic drugs: Secondary | ICD-10-CM

## 2023-01-10 DIAGNOSIS — L7632 Postprocedural hematoma of skin and subcutaneous tissue following other procedure: Secondary | ICD-10-CM | POA: Diagnosis not present

## 2023-01-10 DIAGNOSIS — I420 Dilated cardiomyopathy: Secondary | ICD-10-CM | POA: Diagnosis present

## 2023-01-10 DIAGNOSIS — Z882 Allergy status to sulfonamides status: Secondary | ICD-10-CM

## 2023-01-10 DIAGNOSIS — I739 Peripheral vascular disease, unspecified: Secondary | ICD-10-CM | POA: Diagnosis not present

## 2023-01-10 DIAGNOSIS — B952 Enterococcus as the cause of diseases classified elsewhere: Secondary | ICD-10-CM | POA: Diagnosis not present

## 2023-01-10 DIAGNOSIS — Z833 Family history of diabetes mellitus: Secondary | ICD-10-CM

## 2023-01-10 DIAGNOSIS — K5730 Diverticulosis of large intestine without perforation or abscess without bleeding: Secondary | ICD-10-CM | POA: Diagnosis not present

## 2023-01-10 DIAGNOSIS — S31103D Unspecified open wound of abdominal wall, right lower quadrant without penetration into peritoneal cavity, subsequent encounter: Secondary | ICD-10-CM | POA: Diagnosis not present

## 2023-01-10 DIAGNOSIS — H9193 Unspecified hearing loss, bilateral: Secondary | ICD-10-CM | POA: Diagnosis present

## 2023-01-10 DIAGNOSIS — Z743 Need for continuous supervision: Secondary | ICD-10-CM | POA: Diagnosis not present

## 2023-01-10 DIAGNOSIS — I5021 Acute systolic (congestive) heart failure: Secondary | ICD-10-CM | POA: Diagnosis not present

## 2023-01-10 DIAGNOSIS — J984 Other disorders of lung: Secondary | ICD-10-CM | POA: Diagnosis not present

## 2023-01-10 DIAGNOSIS — Z452 Encounter for adjustment and management of vascular access device: Secondary | ICD-10-CM | POA: Diagnosis not present

## 2023-01-10 DIAGNOSIS — I70201 Unspecified atherosclerosis of native arteries of extremities, right leg: Secondary | ICD-10-CM | POA: Diagnosis present

## 2023-01-10 DIAGNOSIS — S31103A Unspecified open wound of abdominal wall, right lower quadrant without penetration into peritoneal cavity, initial encounter: Secondary | ICD-10-CM | POA: Diagnosis not present

## 2023-01-10 DIAGNOSIS — J449 Chronic obstructive pulmonary disease, unspecified: Secondary | ICD-10-CM | POA: Diagnosis not present

## 2023-01-10 DIAGNOSIS — Z8249 Family history of ischemic heart disease and other diseases of the circulatory system: Secondary | ICD-10-CM

## 2023-01-10 DIAGNOSIS — I9789 Other postprocedural complications and disorders of the circulatory system, not elsewhere classified: Secondary | ICD-10-CM | POA: Diagnosis not present

## 2023-01-10 DIAGNOSIS — E785 Hyperlipidemia, unspecified: Secondary | ICD-10-CM | POA: Diagnosis present

## 2023-01-10 DIAGNOSIS — Z888 Allergy status to other drugs, medicaments and biological substances status: Secondary | ICD-10-CM

## 2023-01-10 DIAGNOSIS — Z79899 Other long term (current) drug therapy: Secondary | ICD-10-CM

## 2023-01-10 DIAGNOSIS — R188 Other ascites: Secondary | ICD-10-CM | POA: Diagnosis not present

## 2023-01-10 DIAGNOSIS — Z955 Presence of coronary angioplasty implant and graft: Secondary | ICD-10-CM

## 2023-01-10 DIAGNOSIS — G4730 Sleep apnea, unspecified: Secondary | ICD-10-CM | POA: Diagnosis not present

## 2023-01-10 DIAGNOSIS — S31109D Unspecified open wound of abdominal wall, unspecified quadrant without penetration into peritoneal cavity, subsequent encounter: Secondary | ICD-10-CM | POA: Diagnosis not present

## 2023-01-10 HISTORY — PX: RIGHT/LEFT HEART CATH AND CORONARY ANGIOGRAPHY: CATH118266

## 2023-01-10 HISTORY — PX: CORONARY STENT INTERVENTION: CATH118234

## 2023-01-10 HISTORY — DX: Presence of cardiac pacemaker: Z95.0

## 2023-01-10 HISTORY — DX: Sleep apnea, unspecified: G47.30

## 2023-01-10 HISTORY — PX: IABP INSERTION: CATH118242

## 2023-01-10 HISTORY — DX: Presence of automatic (implantable) cardiac defibrillator: Z95.810

## 2023-01-10 LAB — CBC WITH DIFFERENTIAL/PLATELET
Abs Immature Granulocytes: 0.03 10*3/uL (ref 0.00–0.07)
Basophils Absolute: 0.1 10*3/uL (ref 0.0–0.1)
Basophils Relative: 1 %
Eosinophils Absolute: 0.2 10*3/uL (ref 0.0–0.5)
Eosinophils Relative: 3 %
HCT: 40.6 % (ref 36.0–46.0)
Hemoglobin: 13.2 g/dL (ref 12.0–15.0)
Immature Granulocytes: 1 %
Lymphocytes Relative: 19 %
Lymphs Abs: 1.1 10*3/uL (ref 0.7–4.0)
MCH: 29.4 pg (ref 26.0–34.0)
MCHC: 32.5 g/dL (ref 30.0–36.0)
MCV: 90.4 fL (ref 80.0–100.0)
Monocytes Absolute: 0.5 10*3/uL (ref 0.1–1.0)
Monocytes Relative: 8 %
Neutro Abs: 4.1 10*3/uL (ref 1.7–7.7)
Neutrophils Relative %: 68 %
Platelets: 310 10*3/uL (ref 150–400)
RBC: 4.49 MIL/uL (ref 3.87–5.11)
RDW: 15 % (ref 11.5–15.5)
WBC: 6 10*3/uL (ref 4.0–10.5)
nRBC: 0 % (ref 0.0–0.2)

## 2023-01-10 LAB — POCT I-STAT EG7
Acid-Base Excess: 0 mmol/L (ref 0.0–2.0)
Acid-Base Excess: 1 mmol/L (ref 0.0–2.0)
Bicarbonate: 26.8 mmol/L (ref 20.0–28.0)
Bicarbonate: 27.2 mmol/L (ref 20.0–28.0)
Calcium, Ion: 1.22 mmol/L (ref 1.15–1.40)
Calcium, Ion: 1.23 mmol/L (ref 1.15–1.40)
HCT: 41 % (ref 36.0–46.0)
HCT: 41 % (ref 36.0–46.0)
Hemoglobin: 13.9 g/dL (ref 12.0–15.0)
Hemoglobin: 13.9 g/dL (ref 12.0–15.0)
O2 Saturation: 61 %
O2 Saturation: 63 %
Potassium: 3.6 mmol/L (ref 3.5–5.1)
Potassium: 3.6 mmol/L (ref 3.5–5.1)
Sodium: 144 mmol/L (ref 135–145)
Sodium: 144 mmol/L (ref 135–145)
TCO2: 28 mmol/L (ref 22–32)
TCO2: 29 mmol/L (ref 22–32)
pCO2, Ven: 48.6 mmHg (ref 44–60)
pCO2, Ven: 49.4 mmHg (ref 44–60)
pH, Ven: 7.349 (ref 7.25–7.43)
pH, Ven: 7.349 (ref 7.25–7.43)
pO2, Ven: 34 mmHg (ref 32–45)
pO2, Ven: 35 mmHg (ref 32–45)

## 2023-01-10 LAB — COMPREHENSIVE METABOLIC PANEL
ALT: 20 U/L (ref 0–44)
ALT: 37 U/L (ref 0–44)
AST: 18 U/L (ref 15–41)
AST: 209 U/L — ABNORMAL HIGH (ref 15–41)
Albumin: 3.5 g/dL (ref 3.5–5.0)
Albumin: 3.5 g/dL (ref 3.5–5.0)
Alkaline Phosphatase: 50 U/L (ref 38–126)
Alkaline Phosphatase: 49 U/L (ref 38–126)
Anion gap: 13 (ref 5–15)
Anion gap: 13 (ref 5–15)
BUN: 17 mg/dL (ref 8–23)
BUN: 18 mg/dL (ref 8–23)
CO2: 22 mmol/L (ref 22–32)
CO2: 25 mmol/L (ref 22–32)
Calcium: 9.1 mg/dL (ref 8.9–10.3)
Calcium: 9 mg/dL (ref 8.9–10.3)
Chloride: 106 mmol/L (ref 98–111)
Chloride: 103 mmol/L (ref 98–111)
Creatinine, Ser: 1.35 mg/dL — ABNORMAL HIGH (ref 0.44–1.00)
Creatinine, Ser: 1.38 mg/dL — ABNORMAL HIGH (ref 0.44–1.00)
GFR, Estimated: 39 mL/min — ABNORMAL LOW (ref >60)
GFR, Estimated: 38 mL/min — ABNORMAL LOW (ref >60)
Glucose, Bld: 244 mg/dL — ABNORMAL HIGH (ref 70–99)
Glucose, Bld: 173 mg/dL — ABNORMAL HIGH (ref 70–99)
Potassium: 3.8 mmol/L (ref 3.5–5.1)
Potassium: 3.6 mmol/L (ref 3.5–5.1)
Sodium: 141 mmol/L (ref 135–145)
Sodium: 141 mmol/L (ref 135–145)
Total Bilirubin: 1.1 mg/dL (ref 0.3–1.2)
Total Bilirubin: 1.1 mg/dL (ref 0.3–1.2)
Total Protein: 6.3 g/dL — ABNORMAL LOW (ref 6.5–8.1)
Total Protein: 6.3 g/dL — ABNORMAL LOW (ref 6.5–8.1)

## 2023-01-10 LAB — CBC
HCT: 42.9 % (ref 36.0–46.0)
HCT: 38.7 % (ref 36.0–46.0)
Hemoglobin: 13.6 g/dL (ref 12.0–15.0)
Hemoglobin: 12.3 g/dL (ref 12.0–15.0)
MCH: 29 pg (ref 26.0–34.0)
MCH: 29.3 pg (ref 26.0–34.0)
MCHC: 31.7 g/dL (ref 30.0–36.0)
MCHC: 31.8 g/dL (ref 30.0–36.0)
MCV: 91.5 fL (ref 80.0–100.0)
MCV: 92.1 fL (ref 80.0–100.0)
Platelets: 310 10*3/uL (ref 150–400)
Platelets: 258 10*3/uL (ref 150–400)
RBC: 4.69 MIL/uL (ref 3.87–5.11)
RBC: 4.2 MIL/uL (ref 3.87–5.11)
RDW: 15.1 % (ref 11.5–15.5)
RDW: 15.2 % (ref 11.5–15.5)
WBC: 6.6 10*3/uL (ref 4.0–10.5)
WBC: 7.6 10*3/uL (ref 4.0–10.5)
nRBC: 0 % (ref 0.0–0.2)
nRBC: 0 % (ref 0.0–0.2)

## 2023-01-10 LAB — POCT I-STAT 7, (LYTES, BLD GAS, ICA,H+H)
Acid-base deficit: 1 mmol/L (ref 0.0–2.0)
Bicarbonate: 24.9 mmol/L (ref 20.0–28.0)
Calcium, Ion: 1.17 mmol/L (ref 1.15–1.40)
HCT: 40 % (ref 36.0–46.0)
Hemoglobin: 13.6 g/dL (ref 12.0–15.0)
O2 Saturation: 96 %
Potassium: 3.5 mmol/L (ref 3.5–5.1)
Sodium: 144 mmol/L (ref 135–145)
TCO2: 26 mmol/L (ref 22–32)
pCO2 arterial: 43.9 mmHg (ref 32–48)
pH, Arterial: 7.361 (ref 7.35–7.45)
pO2, Arterial: 87 mmHg (ref 83–108)

## 2023-01-10 LAB — POCT ACTIVATED CLOTTING TIME
Activated Clotting Time: 261 seconds
Activated Clotting Time: 228 seconds
Activated Clotting Time: 234 seconds
Activated Clotting Time: 336 seconds
Activated Clotting Time: 271 seconds
Activated Clotting Time: 196 seconds
Activated Clotting Time: 277 seconds

## 2023-01-10 LAB — COOXEMETRY PANEL
Carboxyhemoglobin: 1.3 % (ref 0.5–1.5)
Methemoglobin: <0.7 % (ref 0.0–1.5)
O2 Saturation: 53.5 %
Total hemoglobin: 11.6 g/dL — ABNORMAL LOW (ref 12.0–16.0)

## 2023-01-10 LAB — ECHOCARDIOGRAM LIMITED
Calc EF: 24 %
S' Lateral: 5.1 cm
Single Plane A2C EF: 25.5 %
Single Plane A4C EF: 22.1 %

## 2023-01-10 LAB — GLUCOSE, CAPILLARY: Glucose-Capillary: 176 mg/dL — ABNORMAL HIGH (ref 70–99)

## 2023-01-10 LAB — CREATININE, SERUM
Creatinine, Ser: 1.47 mg/dL — ABNORMAL HIGH (ref 0.44–1.00)
GFR, Estimated: 35 mL/min — ABNORMAL LOW (ref >60)

## 2023-01-10 LAB — MAGNESIUM: Magnesium: 1.7 mg/dL (ref 1.7–2.4)

## 2023-01-10 LAB — LACTIC ACID, PLASMA: Lactic Acid, Venous: 2 mmol/L (ref 0.5–1.9)

## 2023-01-10 LAB — APTT: aPTT: >200 seconds (ref 24–36)

## 2023-01-10 LAB — PROTIME-INR
INR: 1.4 — ABNORMAL HIGH (ref 0.8–1.2)
Prothrombin Time: 17.2 seconds — ABNORMAL HIGH (ref 11.4–15.2)

## 2023-01-10 LAB — BRAIN NATRIURETIC PEPTIDE: B Natriuretic Peptide: >4500 pg/mL — ABNORMAL HIGH (ref 0.0–100.0)

## 2023-01-10 LAB — TROPONIN I (HIGH SENSITIVITY)
Troponin I (High Sensitivity): 32 ng/L — ABNORMAL HIGH (ref <18)
Troponin I (High Sensitivity): 2363 ng/L (ref <18)

## 2023-01-10 SURGERY — RIGHT/LEFT HEART CATH AND CORONARY ANGIOGRAPHY
Anesthesia: LOCAL | Laterality: Right

## 2023-01-10 MED ORDER — EMPAGLIFLOZIN 10 MG PO TABS
10.0000 mg | ORAL_TABLET | Freq: Every day | ORAL | Status: DC
  Administered 2023-01-11 – 2023-01-12 (×2): 10 mg via ORAL
  Filled 2023-01-10 (×3): qty 1

## 2023-01-10 MED ORDER — LIDOCAINE HCL (PF) 1 % IJ SOLN
INTRAMUSCULAR | Status: DC | PRN
  Administered 2023-01-10: 2 mL
  Administered 2023-01-10: 5 mL
  Administered 2023-01-11: 10 mg

## 2023-01-10 MED ORDER — HEPARIN SODIUM (PORCINE) 1000 UNIT/ML IJ SOLN
INTRAMUSCULAR | Status: AC
  Filled 2023-01-10: qty 10

## 2023-01-10 MED ORDER — IPRATROPIUM-ALBUTEROL 0.5-2.5 (3) MG/3ML IN SOLN
3.0000 mL | Freq: Once | RESPIRATORY_TRACT | Status: AC
  Administered 2023-01-10: 3 mL via RESPIRATORY_TRACT
  Filled 2023-01-10: qty 3

## 2023-01-10 MED ORDER — HEPARIN BOLUS VIA INFUSION
4000.0000 [IU] | Freq: Once | INTRAVENOUS | Status: AC
  Administered 2023-01-10: 4000 [IU] via INTRAVENOUS
  Filled 2023-01-10: qty 4000

## 2023-01-10 MED ORDER — MORPHINE SULFATE (PF) 2 MG/ML IV SOLN: 1.0000 mg | Freq: Once | INTRAVENOUS | Status: DC

## 2023-01-10 MED ORDER — HYDRALAZINE HCL 20 MG/ML IJ SOLN: 10.0000 mg | INTRAMUSCULAR | Status: DC | PRN

## 2023-01-10 MED ORDER — NITROGLYCERIN IN D5W 200-5 MCG/ML-% IV SOLN
0.0000 ug/min | INTRAVENOUS | Status: DC
  Administered 2023-01-10: 5 ug/min via INTRAVENOUS
  Administered 2023-01-10: 10 ug/min via INTRAVENOUS
  Filled 2023-01-10: qty 250

## 2023-01-10 MED ORDER — LIDOCAINE HCL (PF) 1 % IJ SOLN
INTRAMUSCULAR | Status: AC
  Filled 2023-01-10: qty 30

## 2023-01-10 MED ORDER — ROSUVASTATIN CALCIUM 20 MG PO TABS
20.0000 mg | ORAL_TABLET | Freq: Every day | ORAL | Status: DC
  Administered 2023-01-11 – 2023-01-12 (×2): 20 mg via ORAL
  Filled 2023-01-10 (×2): qty 1

## 2023-01-10 MED ORDER — TICAGRELOR 90 MG PO TABS
90.0000 mg | ORAL_TABLET | Freq: Two times a day (BID) | ORAL | Status: DC
  Administered 2023-01-11 – 2023-01-18 (×16): 90 mg via ORAL
  Filled 2023-01-10 (×16): qty 1

## 2023-01-10 MED ORDER — IPRATROPIUM-ALBUTEROL 0.5-2.5 (3) MG/3ML IN SOLN: 3.0000 mL | Freq: Four times a day (QID) | RESPIRATORY_TRACT | Status: DC | PRN

## 2023-01-10 MED ORDER — HYDRALAZINE HCL 20 MG/ML IJ SOLN
INTRAMUSCULAR | Status: AC
  Administered 2023-01-10: 10 mg via INTRAVENOUS
  Filled 2023-01-10: qty 1

## 2023-01-10 MED ORDER — IOHEXOL 350 MG/ML SOLN
INTRAVENOUS | Status: DC | PRN
  Administered 2023-01-10: 165 mL

## 2023-01-10 MED ORDER — HEPARIN (PORCINE) 25000 UT/250ML-% IV SOLN
850.0000 [IU]/h | INTRAVENOUS | Status: DC
  Administered 2023-01-10: 850 [IU]/h via INTRAVENOUS
  Filled 2023-01-10: qty 250

## 2023-01-10 MED ORDER — MIDAZOLAM HCL 2 MG/2ML IJ SOLN
INTRAMUSCULAR | Status: AC
  Filled 2023-01-10: qty 2

## 2023-01-10 MED ORDER — PHENYLEPHRINE HCL-NACL 20-0.9 MG/250ML-% IV SOLN
INTRAVENOUS | Status: AC
  Filled 2023-01-10: qty 250

## 2023-01-10 MED ORDER — NITROGLYCERIN IN D5W 200-5 MCG/ML-% IV SOLN
INTRAVENOUS | Status: AC | PRN
  Administered 2023-01-10: 40 ug/min via INTRAVENOUS

## 2023-01-10 MED ORDER — TICAGRELOR 90 MG PO TABS
ORAL_TABLET | ORAL | Status: DC | PRN
  Administered 2023-01-10: 180 mg via ORAL

## 2023-01-10 MED ORDER — ASPIRIN 81 MG PO TBEC: 81.0000 mg | DELAYED_RELEASE_TABLET | Freq: Every day | ORAL | Status: DC

## 2023-01-10 MED ORDER — VERAPAMIL HCL 2.5 MG/ML IV SOLN
INTRAVENOUS | Status: AC
  Filled 2023-01-10: qty 2

## 2023-01-10 MED ORDER — MORPHINE SULFATE (PF) 2 MG/ML IV SOLN
2.0000 mg | Freq: Once | INTRAVENOUS | Status: AC
  Administered 2023-01-10: 2 mg via INTRAVENOUS

## 2023-01-10 MED ORDER — NITROGLYCERIN 0.4 MG SL SUBL
0.4000 mg | SUBLINGUAL_TABLET | SUBLINGUAL | Status: DC | PRN
  Administered 2023-01-10: 0.4 mg via SUBLINGUAL
  Filled 2023-01-10: qty 1

## 2023-01-10 MED ORDER — HEPARIN (PORCINE) IN NACL 1000-0.9 UT/500ML-% IV SOLN
INTRAVENOUS | Status: AC
  Filled 2023-01-10: qty 500

## 2023-01-10 MED ORDER — MORPHINE SULFATE (PF) 2 MG/ML IV SOLN
INTRAVENOUS | Status: AC
  Filled 2023-01-10: qty 1

## 2023-01-10 MED ORDER — CEFAZOLIN SODIUM-DEXTROSE 2-3 GM-%(50ML) IV SOLR
INTRAVENOUS | Status: AC | PRN
  Administered 2023-01-10: 2 g via INTRAVENOUS

## 2023-01-10 MED ORDER — NOREPINEPHRINE 4 MG/250ML-% IV SOLN
0.0000 ug/min | INTRAVENOUS | Status: DC
  Administered 2023-01-11: 2 ug/min via INTRAVENOUS
  Administered 2023-01-11: 5 ug/min via INTRAVENOUS

## 2023-01-10 MED ORDER — INSULIN ASPART 100 UNIT/ML IJ SOLN
0.0000 [IU] | Freq: Three times a day (TID) | INTRAMUSCULAR | Status: DC
  Administered 2023-01-10: 3 [IU] via SUBCUTANEOUS
  Administered 2023-01-11: 2 [IU] via SUBCUTANEOUS
  Administered 2023-01-12 – 2023-01-13 (×5): 3 [IU] via SUBCUTANEOUS
  Administered 2023-01-14: 5 [IU] via SUBCUTANEOUS
  Administered 2023-01-14 – 2023-01-15 (×2): 3 [IU] via SUBCUTANEOUS
  Administered 2023-01-15: 2 [IU] via SUBCUTANEOUS
  Administered 2023-01-16: 3 [IU] via SUBCUTANEOUS
  Administered 2023-01-16 – 2023-01-17 (×2): 2 [IU] via SUBCUTANEOUS
  Administered 2023-01-17 (×2): 3 [IU] via SUBCUTANEOUS
  Administered 2023-01-18 – 2023-01-19 (×4): 2 [IU] via SUBCUTANEOUS
  Administered 2023-01-20 – 2023-01-21 (×2): 3 [IU] via SUBCUTANEOUS
  Administered 2023-01-22 – 2023-01-24 (×4): 2 [IU] via SUBCUTANEOUS
  Administered 2023-01-24 – 2023-01-25 (×2): 3 [IU] via SUBCUTANEOUS
  Administered 2023-01-26: 2 [IU] via SUBCUTANEOUS
  Administered 2023-01-27: 3 [IU] via SUBCUTANEOUS
  Administered 2023-01-27: 1 [IU] via SUBCUTANEOUS
  Administered 2023-01-27 – 2023-01-28 (×2): 2 [IU] via SUBCUTANEOUS
  Administered 2023-01-28 (×2): 3 [IU] via SUBCUTANEOUS
  Administered 2023-01-29: 2 [IU] via SUBCUTANEOUS
  Administered 2023-01-29: 3 [IU] via SUBCUTANEOUS
  Administered 2023-01-30 – 2023-02-01 (×5): 2 [IU] via SUBCUTANEOUS
  Administered 2023-02-02: 3 [IU] via SUBCUTANEOUS
  Administered 2023-02-03 (×2): 2 [IU] via SUBCUTANEOUS
  Administered 2023-02-03 – 2023-02-05 (×3): 3 [IU] via SUBCUTANEOUS
  Administered 2023-02-07: 2 [IU] via SUBCUTANEOUS
  Administered 2023-02-07 – 2023-02-08 (×2): 3 [IU] via SUBCUTANEOUS
  Administered 2023-02-08: 2 [IU] via SUBCUTANEOUS
  Administered 2023-02-09: 3 [IU] via SUBCUTANEOUS
  Administered 2023-02-10: 2 [IU] via SUBCUTANEOUS
  Administered 2023-02-11 – 2023-02-12 (×2): 3 [IU] via SUBCUTANEOUS
  Administered 2023-02-12 – 2023-02-13 (×3): 2 [IU] via SUBCUTANEOUS
  Administered 2023-02-14: 3 [IU] via SUBCUTANEOUS
  Administered 2023-02-15: 2 [IU] via SUBCUTANEOUS
  Administered 2023-02-15 – 2023-02-16 (×2): 3 [IU] via SUBCUTANEOUS
  Administered 2023-02-17 (×2): 2 [IU] via SUBCUTANEOUS
  Administered 2023-02-18 – 2023-02-19 (×2): 3 [IU] via SUBCUTANEOUS

## 2023-01-10 MED ORDER — HEPARIN SODIUM (PORCINE) 1000 UNIT/ML IJ SOLN
INTRAMUSCULAR | Status: DC | PRN
  Administered 2023-01-10: 3600 [IU] via INTRAVENOUS
  Administered 2023-01-10: 4000 [IU] via INTRAVENOUS
  Administered 2023-01-10: 2000 [IU] via INTRAVENOUS
  Administered 2023-01-10: 1500 [IU] via INTRAVENOUS
  Administered 2023-01-10: 3000 [IU] via INTRAVENOUS

## 2023-01-10 MED ORDER — VERAPAMIL HCL 2.5 MG/ML IV SOLN
INTRAVENOUS | Status: DC | PRN
  Administered 2023-01-10: 10 mL via INTRA_ARTERIAL

## 2023-01-10 MED ORDER — SACUBITRIL-VALSARTAN 97-103 MG PO TABS
1.0000 | ORAL_TABLET | Freq: Two times a day (BID) | ORAL | Status: DC
  Filled 2023-01-10 (×2): qty 1

## 2023-01-10 MED ORDER — CARVEDILOL 25 MG PO TABS: 25.0000 mg | ORAL_TABLET | Freq: Two times a day (BID) | ORAL | Status: DC

## 2023-01-10 MED ORDER — ACETAMINOPHEN 325 MG PO TABS
650.0000 mg | ORAL_TABLET | Freq: Once | ORAL | Status: AC
  Administered 2023-01-10: 650 mg via ORAL
  Filled 2023-01-10: qty 2

## 2023-01-10 MED ORDER — ONDANSETRON HCL 4 MG/2ML IJ SOLN
4.0000 mg | Freq: Four times a day (QID) | INTRAMUSCULAR | Status: DC | PRN
  Administered 2023-01-10: 4 mg via INTRAVENOUS
  Filled 2023-01-10: qty 2

## 2023-01-10 MED ORDER — ACETAMINOPHEN 325 MG PO TABS
650.0000 mg | ORAL_TABLET | ORAL | Status: DC | PRN
  Administered 2023-01-11 – 2023-02-08 (×14): 650 mg via ORAL
  Filled 2023-01-10 (×10): qty 2

## 2023-01-10 MED ORDER — HEPARIN (PORCINE) 25000 UT/250ML-% IV SOLN
850.0000 [IU]/h | INTRAVENOUS | Status: DC
  Filled 2023-01-10: qty 250

## 2023-01-10 MED ORDER — IPRATROPIUM-ALBUTEROL 20-100 MCG/ACT IN AERS: 1.0000 | INHALATION_SPRAY | Freq: Four times a day (QID) | RESPIRATORY_TRACT | Status: DC | PRN

## 2023-01-10 MED ORDER — CEFAZOLIN SODIUM-DEXTROSE 2-4 GM/100ML-% IV SOLN
INTRAVENOUS | Status: AC
  Filled 2023-01-10: qty 100

## 2023-01-10 MED ORDER — SODIUM CHLORIDE 0.9 % IV SOLN
INTRAVENOUS | Status: AC | PRN
  Administered 2023-01-10: 10 mL/h via INTRAVENOUS

## 2023-01-10 MED ORDER — TICAGRELOR 90 MG PO TABS
ORAL_TABLET | ORAL | Status: AC
  Filled 2023-01-10: qty 2

## 2023-01-10 MED ORDER — MIDAZOLAM HCL 2 MG/2ML IJ SOLN
INTRAMUSCULAR | Status: DC | PRN
  Administered 2023-01-10: 1 mg via INTRAVENOUS

## 2023-01-10 MED ORDER — NOREPINEPHRINE 4 MG/250ML-% IV SOLN
INTRAVENOUS | Status: AC
  Filled 2023-01-10: qty 250

## 2023-01-10 MED ORDER — HEPARIN (PORCINE) IN NACL 1000-0.9 UT/500ML-% IV SOLN
INTRAVENOUS | Status: DC | PRN
  Administered 2023-01-10 (×2): 500 mL

## 2023-01-10 MED ORDER — AMIODARONE HCL 200 MG PO TABS: 100.0000 mg | ORAL_TABLET | ORAL | Status: DC

## 2023-01-10 MED ORDER — ASPIRIN 81 MG PO CHEW
81.0000 mg | CHEWABLE_TABLET | Freq: Every day | ORAL | Status: DC
  Administered 2023-01-11 – 2023-01-30 (×20): 81 mg via ORAL
  Filled 2023-01-10 (×20): qty 1

## 2023-01-10 MED ORDER — FUROSEMIDE 10 MG/ML IJ SOLN
40.0000 mg | Freq: Once | INTRAMUSCULAR | Status: AC
  Administered 2023-01-10: 40 mg via INTRAVENOUS
  Filled 2023-01-10: qty 4

## 2023-01-10 SURGICAL SUPPLY — 27 items
BALLN EMERGE MR 2.0X12 (BALLOONS) ×2
BALLN ~~LOC~~ EMERGE MR 2.25X12 (BALLOONS) ×2
BALLOON EMERGE MR 2.0X12 (BALLOONS) IMPLANT
BALLOON ~~LOC~~ EMERGE MR 2.25X12 (BALLOONS) IMPLANT
CATH 5FR JL3.5 JR4 ANG PIG MP (CATHETERS) IMPLANT
CATH IAB 7FR 34ML (CATHETERS) IMPLANT
CATH SWAN GANZ 7F STRAIGHT (CATHETERS) IMPLANT
CATH VISTA GUIDE 6FR XB3.5 (CATHETERS) IMPLANT
DEVICE RAD COMP TR BAND LRG (VASCULAR PRODUCTS) IMPLANT
ELECT DEFIB PAD ADLT CADENCE (PAD) IMPLANT
GLIDESHEATH SLEND SS 6F .021 (SHEATH) IMPLANT
GUIDEWIRE INQWIRE 1.5J.035X260 (WIRE) IMPLANT
INQWIRE 1.5J .035X260CM (WIRE) ×2
KIT ENCORE 26 ADVANTAGE (KITS) IMPLANT
KIT HEART LEFT (KITS) ×2 IMPLANT
KIT MICROPUNCTURE NIT STIFF (SHEATH) IMPLANT
PACK CARDIAC CATHETERIZATION (CUSTOM PROCEDURE TRAY) ×2 IMPLANT
PROTECTION STATION PRESSURIZED (MISCELLANEOUS) ×2
SHEATH PINNACLE 7F 10CM (SHEATH) IMPLANT
SHEATH PROBE COVER 6X72 (BAG) IMPLANT
SLEEVE REPOSITIONING LENGTH 30 (MISCELLANEOUS) IMPLANT
STATION PROTECTION PRESSURIZED (MISCELLANEOUS) IMPLANT
STENT ONYX FRONTIER 2.25X22 (Permanent Stent) IMPLANT
TRANSDUCER W/STOPCOCK (MISCELLANEOUS) ×2 IMPLANT
TUBING CIL FLEX 10 FLL-RA (TUBING) ×2 IMPLANT
WIRE COUGAR XT STRL 190CM (WIRE) IMPLANT
WIRE EMERALD 3MM-J .025X260CM (WIRE) IMPLANT

## 2023-01-10 NOTE — Progress Notes (Signed)
Progress Note: Shortly after arriving at 2 heart following the catheterization laboratory patient started to have oozing and bleed at the IABP groin site.  Groin pressure has been applied so far for approximately 3 hours with still continued oozing. Since the intra-aortic balloon was inserted , the patient's pulmonary pressures have markedly improved and over the past 4 hours she has diuresed over 1200 cc.  She was started on IV NTG to aid with preload reduction when she became significantly hypertensive.   Right lower extremity is cool to touch and it was difficult to hear Doppler DP pressures.  The patient has full movement of her foot and denies any numbness or paresthesias.  Now after 4 hours with the IABP in place,  PA pressure has improved from 74/35 to 54/18.  I had lengthy discussion with the patient's family approximately 1 hour ago summarizing the clinical events clinical events. INR has drifted down to 190.  The balloon pump has been slowly weaned from 101, to 1-2, and to 1-3.  This did not result in any drop in blood pressure or change in augmentation PA pressures remained significantly improved down to 52/18.  At this point, I again spoke with family members again discussed discontinuing the intra-aortic balloon pump due to her reduced right lower extremity perfusion with continued oozing.  At approximately 23:15, the intra-aortic balloon pump was discontinued.  Patient's blood pressure remained stable and so far PA pressures are in the54/18 range.  She is on IV nitroglycerin.  Plan for an additional 1 hour of groin hold for optimal hemostasis.  As long as blood pressure is stable we will continue IV nitroglycerin.  CC time 90 minutes Shelva Majestic, MD  11:36 PM

## 2023-01-10 NOTE — Progress Notes (Signed)
  Echocardiogram 2D Echocardiogram has been performed.  Natasha Woodard 01/10/2023, 5:24 PM

## 2023-01-10 NOTE — ED Triage Notes (Signed)
Pt via EMS from home for eval of chest pain x 1 hour and DOE x 1 month. 324 ASA given by EMS but no nitro d/t just being 5/10 pain.

## 2023-01-10 NOTE — ED Provider Notes (Signed)
[facility] Provider Note   CSN: [id number] Arrival date & time: 01/10/23  1026     History  Chief Complaint  Patient presents with   Chest Pain   Shortness of Breath    ELIKA GODAR is a 82 y.o. female.  82 yo F with a chief complaint of chest pain.  The patient states this morning she developed sharp pain across the center of her chest.  She denied any radiation of this pain it did make her short of breath and she felt hot off and on.  She has been having shortness of breath on exertion but no chest discomfort over the past month.  She has been taking her diuretics as prescribed and feels like she has less fluid on her than normal.  She denies cough congestion or fever.   Chest Pain Associated symptoms: shortness of breath   Shortness of Breath Associated symptoms: chest pain        Home Medications Prior to Admission medications   Medication Sig Start Date End Date Taking? Authorizing Provider  acetaminophen (TYLENOL) 500 MG tablet Take 500 mg by mouth every 6 (six) hours as needed.    [provider]  amiodarone (PACERONE) 200 MG tablet TAKE ONE-HALF TABLET BY MOUTH EVERY TUES/THURS/SAT 11/10/22   Tonia Ghent, MD  aspirin 81 MG tablet Take 1 tablet (81 mg total) by mouth daily. 09/14/17   Tonia Ghent, MD  carvedilol (COREG) 25 MG tablet TAKE 1 TABLET BY MOUTH TWICE DAILY 11/10/22   Tonia Ghent, MD  Cholecalciferol (VITAMIN D) 1000 UNITS capsule Take 1,000 Units by mouth daily.    [provider]  doxycycline (VIBRA-TABS) 100 MG tablet Take 1 tablet (100 mg total) by mouth 2 (two) times daily. 11/23/22   Copland, Frederico Hamman, MD  Fluticasone-Umeclidin-Vilant (TRELEGY ELLIPTA) 100-62.5-25 MCG/ACT AEPB Inhale 1 puff into the lungs daily. Patient not taking: Reported on 12/02/2022 10/10/22   Deneise Lever, MD  furosemide (LASIX) 20 MG tablet Take 1 tablet (20 mg total) by mouth daily. 06/17/22    Loel Dubonnet, NP  glucose blood (ACCU-CHEK AVIVA PLUS) test strip USE TO TEST BLOOD SUGAR ONCE DAILY FOR DM E11.9 09/27/21   Tonia Ghent, MD  Ipratropium-Albuterol (COMBIVENT RESPIMAT) 20-100 MCG/ACT AERS respimat Inhale 1 puff into the lungs every 6 (six) hours as needed for wheezing or shortness of breath. 01/04/23   Ria Bush, MD  JARDIANCE 10 MG TABS tablet TAKE 1/2 TABLET(5 MG) BY MOUTH DAILY 08/12/22   Leonie Man, MD  Lancets (ACCU-CHEK MULTICLIX) lancets USE ONE LANCET TO TEST BLOOD GLUCOSE DAILY FOR DM 250.00 05/25/22   Tonia Ghent, MD  metFORMIN (GLUCOPHAGE) 500 MG tablet TAKE 1/2 TABLET BY MOUTH DAILY WITH A MEAL 09/27/22   Tonia Ghent, MD  rosuvastatin (CRESTOR) 10 MG tablet TAKE 1 TABLET(10 MG) BY MOUTH DAILY 10/18/22   Tonia Ghent, MD  sacubitril-valsartan (ENTRESTO) 97-103 MG Take 1 tablet by mouth 2 (two) times daily. 10/24/22   Leonie Man, MD  traMADol (ULTRAM) 50 MG tablet TAKE 1 TABLET(50 MG) BY MOUTH THREE TIMES DAILY AS NEEDED 11/16/22   Tonia Ghent, MD      Allergies    Ezetimibe-simvastatin, Lipitor [atorvastatin], Claritin [loratadine], Spironolactone, Tamiflu [oseltamivir phosphate], Pravastatin sodium, and Sulfonamide derivatives    Review of Systems   Review of Systems  Respiratory:  Positive for shortness of breath.   Cardiovascular:  Positive  for chest pain.    Physical Exam Updated Vital Signs BP (!) 129/100 (BP Location: Left Arm)   Pulse 69   Temp 97.6 F (36.4 C) (Oral)   Resp 16   SpO2 95%  Physical Exam Vitals and nursing note reviewed.  Constitutional:      General: She is not in acute distress.    Appearance: She is well-developed. She is not diaphoretic.  HENT:     Head: Normocephalic and atraumatic.  Eyes:     Pupils: Pupils are equal, round, and reactive to light.  Cardiovascular:     Rate and Rhythm: Normal rate and regular rhythm.     Heart sounds: No murmur heard.    No friction rub. No  gallop.  Pulmonary:     Effort: Pulmonary effort is normal.     Breath sounds: No wheezing or rales.  Chest:     Chest wall: No tenderness.  Abdominal:     General: There is no distension.     Palpations: Abdomen is soft.     Tenderness: There is no abdominal tenderness.  Musculoskeletal:        General: No tenderness.     Cervical back: Normal range of motion and neck supple.  Skin:    General: Skin is warm and dry.  Neurological:     Mental Status: She is alert and oriented to person, place, and time.  Psychiatric:        Behavior: Behavior normal.     ED Results / Procedures / Treatments   Labs (all labs ordered are listed, but only abnormal results are displayed) Labs Reviewed  COMPREHENSIVE METABOLIC PANEL - Abnormal; Notable for the following components:      Result Value   Glucose, Bld 244 (*)    Creatinine, Ser 1.35 (*)    Total Protein 6.3 (*)    GFR, Estimated 39 (*)    All other components within normal limits  TROPONIN I (HIGH SENSITIVITY) - Abnormal; Notable for the following components:   Troponin I (High Sensitivity) 32 (*)    All other components within normal limits  TROPONIN I (HIGH SENSITIVITY) - Abnormal; Notable for the following components:   Troponin I (High Sensitivity) 2,363 (*)    All other components within normal limits  CBC WITH DIFFERENTIAL/PLATELET    EKG EKG Interpretation  Date/Time:  Tuesday January 10 2023 10:15:18 EST Ventricular Rate:  67 PR Interval:  140 QRS Duration: 138 QT Interval:  476 QTC Calculation: 502 R Axis:   -55 Text Interpretation: Sinus rhythm with Premature supraventricular complexes and with occasional Premature ventricular complexes Left axis deviation Non-specific intra-ventricular conduction block Nonspecific T wave abnormality Abnormal ECG Otherwise no significant change Confirmed by Deno Etienne [phone]) on 01/10/2023 2:32:13 PM  Radiology DG Chest 2 View  Result Date: 01/10/2023 CLINICAL DATA:  Chest  pain.  Shortness of breath. EXAM: CHEST - 2 VIEW COMPARISON:  06/10/2022 FINDINGS: Stable cardiac enlargement. Aortic atherosclerosis. Blunting of the costophrenic angles noted with mild thickening along the fissures. Pulmonary vascular congestion. Atelectasis noted within the right lung base. IMPRESSION: 1. Cardiac enlargement and pulmonary vascular congestion. 2. Suspect small pleural effusions. 3.  Aortic Atherosclerosis (ICD10-I70.0). Electronically Signed   By: Kerby Moors M.D.   On: 01/10/2023 11:08    Procedures .Critical Care  Performed by: Deno Etienne, DO Authorized by: Deno Etienne, DO   Critical care provider statement:    Critical care time (minutes):  35   Critical care time was  exclusive of:  Separately billable procedures and treating other patients   Critical care was time spent personally by me on the following activities:  Development of treatment plan with patient or surrogate, discussions with consultants, evaluation of patient's response to treatment, examination of patient, ordering and review of laboratory studies, ordering and review of radiographic studies, ordering and performing treatments and interventions, pulse oximetry, re-evaluation of patient's condition and review of old charts   Care discussed with: admitting provider       Medications Ordered in ED Medications  nitroGLYCERIN (NITROSTAT) SL tablet 0.4 mg (0.4 mg Sublingual Given 01/10/23 1044)  nitroGLYCERIN 50 mg in dextrose 5 % 250 mL (0.2 mg/mL) infusion (10 mcg/min Intravenous New Bag/Given (Non-Interop) 01/10/23 1504)  heparin bolus via infusion 4,000 Units (has no administration in time range)  heparin ADULT infusion 100 units/mL (25000 units/220mL) (has no administration in time range)  acetaminophen (TYLENOL) tablet 650 mg (650 mg Oral Given 01/10/23 1201)    ED Course/ Medical Decision Making/ A&P                             Medical Decision Making Amount and/or Complexity of Data Reviewed Labs:  ordered. ECG/medicine tests: ordered.  Risk Prescription drug management. Decision regarding hospitalization.   82 yo F with a chief complaint of chest pain.  This has been going on since this morning.  Has been very short of breath with it.  No history of an MI.  Has a history of heart failure with preserved EF.  Patient's EKG with some possible elevations and depressions, perhaps PVCs.  Initial troponin was mildly elevated and then repeat was greater than 2000.  Will discuss with cardiology.  Discussed with card master plan to have cards team see urgently with ongoing pain and EKG changes.  Repeat EKG with resolution of what was seen earlier, no obvious PVCs, no obvious ST elevation.  The patients results and plan were reviewed and discussed.   Any x-rays performed were independently reviewed by myself.   Differential diagnosis were considered with the presenting HPI.  Medications  nitroGLYCERIN (NITROSTAT) SL tablet 0.4 mg (0.4 mg Sublingual Given 01/10/23 1044)  nitroGLYCERIN 50 mg in dextrose 5 % 250 mL (0.2 mg/mL) infusion (10 mcg/min Intravenous New Bag/Given (Non-Interop) 01/10/23 1504)  heparin bolus via infusion 4,000 Units (has no administration in time range)  heparin ADULT infusion 100 units/mL (25000 units/230mL) (has no administration in time range)  acetaminophen (TYLENOL) tablet 650 mg (650 mg Oral Given 01/10/23 1201)    Vitals:   01/10/23 1031  BP: (!) 129/100  Pulse: 69  Resp: 16  Temp: 97.6 F (36.4 C)  TempSrc: Oral  SpO2: 95%    Final diagnoses:  NSTEMI (non-ST elevated myocardial infarction) [facility])    Admission/ observation were discussed with the admitting physician, patient and/or family and they are comfortable with the plan.             Final Clinical Impression(s) / ED Diagnoses Final diagnoses:  NSTEMI (non-ST elevated myocardial infarction) [facility])    Rx / [facility] Orders ED Discharge Orders     None         Deno Etienne,  DO 01/10/23 1619

## 2023-01-10 NOTE — ED Provider Notes (Signed)
  Physical Exam  BP (!) 153/96   Pulse 72   Temp 98.2 F (36.8 C) (Axillary)   Resp (!) 35   SpO2 97%   Physical Exam Vitals and nursing note reviewed.  Constitutional:      General: She is not in acute distress.    Appearance: She is well-developed.  HENT:     Head: Normocephalic and atraumatic.     Mouth/Throat:     Mouth: Mucous membranes are moist.  Eyes:     Pupils: Pupils are equal, round, and reactive to light.  Cardiovascular:     Rate and Rhythm: Normal rate and regular rhythm.     Heart sounds: No murmur heard. Pulmonary:     Effort: Tachypnea and accessory muscle usage present. No respiratory distress.     Breath sounds: Examination of the right-lower field reveals decreased breath sounds and rales. Examination of the left-lower field reveals decreased breath sounds and rales. Decreased breath sounds and rales present.  Abdominal:     General: Abdomen is flat.     Palpations: Abdomen is soft.     Tenderness: There is no abdominal tenderness.  Musculoskeletal:        General: No tenderness.     Right lower leg: Edema present.     Left lower leg: Edema present.  Skin:    General: Skin is warm and dry.  Neurological:     General: No focal deficit present.     Mental Status: She is alert. Mental status is at baseline.  Psychiatric:        Mood and Affect: Mood normal.        Behavior: Behavior normal.     Procedures  .Critical Care  Performed by: Cristie Hem, MD Authorized by: Cristie Hem, MD   Critical care provider statement:    Critical care time (minutes):  30   Critical care was time spent personally by me on the following activities:  Development of treatment plan with patient or surrogate, discussions with consultants, evaluation of patient's response to treatment, examination of patient, ordering and review of laboratory studies, ordering and review of radiographic studies, ordering and performing treatments and interventions, pulse  oximetry, re-evaluation of patient's condition and review of old charts   ED Course / MDM   Clinical Course as of 01/10/23 1654  Tue Jan 10, 2023  1652 Give signout from Dr. Tyrone Nine, pending cardiology evaluation.  Discussed with Dr. Debara Pickett, cardiology attending.  EKG shows worsening ST changes, not clearly meeting STEMI criteria but he plans for urgent/emergent cardiac catheterization as soon as possible given significant troponin elevation and dynamic EKG changes.  Patient does have some increased work of breathing, not hypoxic, does have some decreased basilar breath sounds with bilateral pleural effusions on chest x-ray as well as some crackles suggestive of pulm edema.  Will start BiPAP and trial DuoNeb.  Patient admitted to cardiology service. [WS]    Clinical Course User Index [WS] Cristie Hem, MD   Medical Decision Making Amount and/or Complexity of Data Reviewed Independent Historian: friend Labs: ordered. Radiology: independent interpretation performed. Decision-making details documented in ED Course. ECG/medicine tests: ordered.  Risk Prescription drug management. Drug therapy requiring intensive monitoring for toxicity. Decision regarding hospitalization. Emergency major surgery.          Cristie Hem, MD 01/10/23 [phone]

## 2023-01-10 NOTE — Progress Notes (Signed)
Admission from cath lab.

## 2023-01-10 NOTE — ED Notes (Signed)
Pt to cath lab.

## 2023-01-10 NOTE — Progress Notes (Signed)
Patient placed on BIPAP and transported to Cath lab with no complications noted.

## 2023-01-10 NOTE — H&P (Addendum)
Cardiology History and Physical Exam   Patient ID: Natasha Woodard MRN: [id number]; DOB: 07-26-1941   Admission date: 01/10/2023  PCP:  Tonia Ghent, MD   [facility] Providers Cardiologist:  None        Chief Complaint:  chest pain  Patient Profile:   Natasha Woodard is a 82 y.o. female with chronic HFpEF, dilated cardiomyopathy, frequent PVCs, hyperlipidemia, DM type II, intermittent claudication of bilateral lower extremities, CKD, OSA who is being seen 01/10/2023 for the evaluation of chest pain, elevated troponin.  History of Present Illness:   Ms. Ranganathan presented to the ED today after sudden onset of squeezing pain across her chest shortly after eating breakfast. Patient reports noticing worsening shortness of breath and feeling flushed at the same time. In addition, patient says that for the past several weeks, she has had increasing shortness of breath, primarily with exertion but more recently with rest and at night as well. Per review of outpatient notes, patient recently reached out to her PCP/pulmonology regarding dyspnea and was advised to increase lasix to 21m for 3 days. Interestingly, patient[facility]. Initial labs notable for troponin 32 -> 2363, glucose 244, creatinine 1.35.  His cardiac history is notable for chronic heart failure with preserved ejection fraction.  She is currently managed with carvedilol 25 mg twice daily, Entresto 97-103 mg twice daily, Jardiance 10 mg daily.  Patient historically with significant PVC burden, now on amnio 100 mg 3 times a week with Coreg.  PVC burden down to 6% from 9% on last heart monitor October 2023.  Patient's last echocardiogram in February 2023 showed LVEF 50 to 55%.  Low normal function.  Mild left ventricular hypertrophy with grade 2 diastolic dysfunction.  Patient with history of left and right heart catheterization in July 2018.  Mild to moderate  multivessel CAD with no obstructive lesions identified.  Past Medical History:  Diagnosis Date   Calcific tendonitis 2008   Treatment of left leg   [place]   a) Echo: EF 25-30%. GR 1-2 DD w/ elevated LVEDP. Mild valvular Dz; b) Cardiac MRI 2/'19: frequent PVCs (diffiuclt to interpret) - EF ~27% w/ diffuse HK.  No evidence of infarct, infiltrative Dz or myocarditis. -- ? if related to PVCs. c) f/u Echo 6/'19: EF 35-40%. Gr 1 DD. Diffuse HK.-> d) Jan 2020 EF 50 to 55%.  Mod MAC, mod LAE.  Ao Sclerosis; e) Echo 1/'21 - EF 60-65%, Gr II DD. Mild Mod MR.    Chronic combined systolic and diastolic CHF, NYHA class 2 and ACA/AHA stage C    Now essentially resolved, back to simply diastolic CHF   Coronary artery disease, non-occlusive    mild-moderate CAD 06/30/17 cath   CTS (carpal tunnel syndrome)    Diabetes mellitus    Type II   Dysrhythmia    patient said that she cant remember what it is   Frequent unifocal PVCs 01/2018   Event Monitor: NSR, W/ frequent multifocal PVCs (9%-down from 30% prior to amiodarone) and PACs.  Nighttime bradycardia suggestive of OSA.';  Zio patch September 2023: PVC burden now 6.1%.   Hiatal hernia    with reflux   Hyperlipidemia    Statin intolerant   Hypertension    Ichthyosis congenita    Intermittent claudication of both lower extremities due to atherosclerosis ([place] 08/22/2021   LEA Dopplers 09/09/2021:  Right: Total occlusion noted in the superficial femoral  artery. Atherosclerosis noted throughout extremity, see note above. Three vessel runoff.  Left: Total occlusion noted in the superficial femoral artery and/or popliteal artery. Total occlusion noted in the distal anterior tibial artery. Athereosclerosis noted throughout extremity.    NSVD (normal spontaneous vaginal delivery) 1971 & 1972   Obesity    Plantar fasciitis    Left   Pulmonary nodule    Imaged multiple times and benign appearing   Wears glasses     Past Surgical  History:  Procedure Laterality Date   ABDOMINAL HYSTERECTOMY  1975   TAH-BSO   ABSCESS DRAINAGE  1972   right breast   BREAST BIOPSY Left 01/14/2009   Stereo- Benign   CARDIAC MRI  01/2018   Difficult to interpret 2/2 PVCs. Normal LV size - EF ~27% with diffuse HK. Mild RV dilation - normal fxn. -- NO MYOCARDIAL LGI => no definitive evidence of prior MI, Infiltrative Dz or Myocarditis -- suspect NICM, possibly related to PVCs.   CARPAL TUNNEL RELEASE Right 07/02/2014   Procedure: RIGHT CARPAL TUNNEL RELEASE;  Surgeon: Wynonia Sours, MD;  Location: [place];  Service: Orthopedics;  Laterality: Right;   CARPAL TUNNEL RELEASE Left 06/11/2015   Procedure: LEFT CARPAL TUNNEL RELEASE;  Surgeon: Daryll Brod, MD;  Location: [place];  Service: Orthopedics;  Laterality: Left;  REGIONAL/FAB   COLONOSCOPY     corn removal  1968   right   Holter Monitor  06/2017   ~17,000 PVC beats - majority were singlets, some couplets. 44 brief 3-7 beat runs of NSVT. Also noted were less frequent PACs with 11 runs (longest 15 beats)   OPEN REDUCTION INTERNAL FIXATION (ORIF) DISTAL RADIAL FRACTURE Left 07/21/2017   Procedure: OPEN REDUCTION INTERNAL FIXATION (ORIF) LEFT DISTAL RADIAL FRACTURE;  Surgeon: Renette Butters, MD;  Location: [place];  Service: Orthopedics;  Laterality: Left;   RIGHT/LEFT HEART CATH AND CORONARY ANGIOGRAPHY N/A 06/30/2017   Procedure: Right/Left Heart Cath and Coronary Angiography;  Surgeon: Leonie Man, MD;  Location: [facility] INVASIVE CV LAB:  pRCA 55%, pCx 40%, OM1 45%, mCx 50%, D2 50% - LVEF 25-35%. Moderately elevated LVEDP(26 mmHg with PCWP 16 mmHg).  FICK CO/CI: 4.47/2.48. PA pressures 47/14 mmHg with a mean of 27 mm.   ROTATOR CUFF REPAIR     left shoulder   TRANSTHORACIC ECHOCARDIOGRAM  06/2017   EF 25 and 30%. GR 1 DD. Mild diastolic dysfunction with elevated LVEDP. Mild valvular disease.   TRANSTHORACIC ECHOCARDIOGRAM  11'18, 6/'19    a) EF remains  30-35%.  Diffuse hypokinesis noted.  Severe LA dilation; b) Improved EF 35-40%.  Diffuse HK.  GR 1 DD.  Moderate TR.  Mild RV dilation.   TRANSTHORACIC ECHOCARDIOGRAM  01/28/2022   Follow-up echo: EF 50 to 55%.  No RWMA.  GRII DD.  Severe LA dilation.  Mild RA dilation with normal PAP and RAP.  Mild to moderate TR (no significant change noted)   Zio patch  08/2022   3-day: Currently NSR (HR range 47-91 with average 64 bpm) 1 F AVB with IVCD/BBB.  Rare isolated PACs (<1%).  Frequent PVCs (6.1% with rare couplets and triplets.  Also bigeminy.  2 atrial runs 8 beats 5 2 seconds.  No sustained arrhythmias.   ZIO Patch 14 d Event Monitor  10/2018   Results as below < 1% PVC.  (Notably improved after starting amiodarone)     Medications Prior to Admission: Prior to Admission medications   Medication  Sig Start Date End Date Taking? Authorizing Provider  acetaminophen (TYLENOL) 500 MG tablet Take 500 mg by mouth every 6 (six) hours as needed.    [provider]  amiodarone (PACERONE) 200 MG tablet TAKE ONE-HALF TABLET BY MOUTH EVERY TUES/THURS/SAT 11/10/22   Tonia Ghent, MD  aspirin 81 MG tablet Take 1 tablet (81 mg total) by mouth daily. 09/14/17   Tonia Ghent, MD  carvedilol (COREG) 25 MG tablet TAKE 1 TABLET BY MOUTH TWICE DAILY 11/10/22   Tonia Ghent, MD  Cholecalciferol (VITAMIN D) 1000 UNITS capsule Take 1,000 Units by mouth daily.    [provider]  doxycycline (VIBRA-TABS) 100 MG tablet Take 1 tablet (100 mg total) by mouth 2 (two) times daily. 11/23/22   Copland, Frederico Hamman, MD  Fluticasone-Umeclidin-Vilant (TRELEGY ELLIPTA) 100-62.5-25 MCG/ACT AEPB Inhale 1 puff into the lungs daily. Patient not taking: Reported on 12/02/2022 10/10/22   Deneise Lever, MD  furosemide (LASIX) 20 MG tablet Take 1 tablet (20 mg total) by mouth daily. 06/17/22   Loel Dubonnet, NP  glucose blood (ACCU-CHEK AVIVA PLUS) test strip USE TO TEST BLOOD SUGAR ONCE DAILY FOR DM E11.9  09/27/21   Tonia Ghent, MD  Ipratropium-Albuterol (COMBIVENT RESPIMAT) 20-100 MCG/ACT AERS respimat Inhale 1 puff into the lungs every 6 (six) hours as needed for wheezing or shortness of breath. 01/04/23   Ria Bush, MD  JARDIANCE 10 MG TABS tablet TAKE 1/2 TABLET(5 MG) BY MOUTH DAILY 08/12/22   Leonie Man, MD  Lancets (ACCU-CHEK MULTICLIX) lancets USE ONE LANCET TO TEST BLOOD GLUCOSE DAILY FOR DM 250.00 05/25/22   Tonia Ghent, MD  metFORMIN (GLUCOPHAGE) 500 MG tablet TAKE 1/2 TABLET BY MOUTH DAILY WITH A MEAL 09/27/22   Tonia Ghent, MD  rosuvastatin (CRESTOR) 10 MG tablet TAKE 1 TABLET(10 MG) BY MOUTH DAILY 10/18/22   Tonia Ghent, MD  sacubitril-valsartan (ENTRESTO) 97-103 MG Take 1 tablet by mouth 2 (two) times daily. 10/24/22   Leonie Man, MD  traMADol (ULTRAM) 50 MG tablet TAKE 1 TABLET(50 MG) BY MOUTH THREE TIMES DAILY AS NEEDED 11/16/22   Tonia Ghent, MD     Allergies:    Allergies  Allergen Reactions   Ezetimibe-Simvastatin Other (See Comments)    REACTION: Muscle aches (side effect)   Lipitor [Atorvastatin] Other (See Comments)    Leg weakness    Claritin [Loratadine]     Possible cause of nightmares.     Spironolactone     Elevated potassium/creatinine   Tamiflu [Oseltamivir Phosphate] Other (See Comments)    nightmares   Pravastatin Sodium Other (See Comments)    REACTION: Muscle aches (side effect)   Sulfonamide Derivatives Nausea And Vomiting    Social History:   Social History   Socioeconomic History   Marital status: Married    Spouse name: Not on file   Number of children: 2   Years of education: Not on file   Highest education level: Not on file  Occupational History    Employer: RETIRED  Tobacco Use   Smoking status: Former    Years: 28.00    Types: Cigarettes    Quit date: 12/12/1993    Years since quitting: 29.0   Smokeless tobacco: Never  Vaping Use   Vaping Use: Never used  Substance and Sexual Activity    Alcohol use: No    Alcohol/week: 0.0 standard drinks of alcohol   Drug use: No   Sexual activity: Never  Other Topics Concern   Not on file  Social History Narrative   Two kids, two grandchildren, two great grandchildren.    Married 1969   Retired.    Social Determinants of Health   Financial Resource Strain: Low Risk  (05/12/2022)   Overall Financial Resource Strain (CARDIA)    Difficulty of Paying Living Expenses: Not hard at all  Food Insecurity: No Food Insecurity (05/12/2022)   Hunger Vital Sign    Worried About Running Out of Food in the Last Year: Never true    Ran Out of Food in the Last Year: Never true  Transportation Needs: No Transportation Needs (05/12/2022)   PRAPARE - Hydrologist (Medical): No    Lack of Transportation (Non-Medical): No  Physical Activity: Sufficiently Active (05/12/2022)   Exercise Vital Sign    Days of Exercise per Week: 6 days    Minutes of Exercise per Session: 60 min  Stress: No Stress Concern Present (05/12/2022)   [place]    Feeling of Stress : Not at all  Social Connections: Not on file  Intimate Partner Violence: Not At Risk (11/12/2019)   Humiliation, Afraid, Rape, and Kick questionnaire    Fear of Current or Ex-Partner: No    Emotionally Abused: No    Physically Abused: No    Sexually Abused: No    Family History:   The patient's family history includes Cancer in her brother; Diabetes in her father and mother; Heart disease in her brother, father, and mother; Hypertension in an other family member. There is no history of Colon cancer or Breast cancer.    ROS:  Please see the history of present illness. All other ROS reviewed and negative.     Physical Exam/Data:   Vitals:   01/10/23 1031  BP: (!) 129/100  Pulse: 69  Resp: 16  Temp: 97.6 F (36.4 C)  TempSrc: Oral  SpO2: 95%   No intake or output data in the 24 hours ending 01/10/23  1458    12/02/2022   11:20 AM 11/23/2022    3:14 PM 11/10/2022    4:03 PM  Last 3 Weights  Weight (lbs) 159 lb 2 oz 161 lb 163 lb  Weight (kg) 72.179 kg 73.029 kg 73.936 kg     There is no height or weight on file to calculate BMI.  General:  Patient appears acutely short of breath, breathing 30-40 times per minute HEENT: normal Neck: Mild elevation of JVP. Difficult to assess. Vascular: No carotid bruits; Distal pulses 2+ bilaterally   Cardiac:  normal S1, S2; RRR; no murmur Lungs:  diffusely diminished. Generalized wheezing with some lower lobe rales. Abd: soft, nontender, no hepatomegaly  Ext: no edema Musculoskeletal:  No deformities, BUE and BLE strength normal and equal Skin: warm and dry  Neuro:  CNs 2-12 intact, no focal abnormalities noted Psych:  Normal affect    EKG:  Initial ECG today with interventricular conduction delay, no ST elevation. The ECG that was done this afternoon was personally reviewed and demonstrates RBBB with LAFB. ST elevation noted in III, AVF, V5, V6  Relevant CV Studies:  01/28/22 TTE  IMPRESSIONS     1. Left ventricular ejection fraction, by estimation, is 50 to 55%. The  left ventricle has low normal function. The left ventricle has no regional  wall motion abnormalities. There is mild left ventricular hypertrophy.  Left ventricular diastolic  parameters are consistent with Grade  II diastolic dysfunction  (pseudonormalization).   2. Right ventricular systolic function is normal. The right ventricular  size is normal. There is normal pulmonary artery systolic pressure.   3. Left atrial size was severely dilated.   4. Right atrial size was mildly dilated.   5. The mitral valve is grossly normal. Mild mitral valve regurgitation.   6. Tricuspid valve regurgitation is mild to moderate.   7. The aortic valve is grossly normal. Aortic valve regurgitation is not  visualized. No aortic stenosis is present.   8. The inferior vena cava is  normal in size with greater than 50%  respiratory variability, suggesting right atrial pressure of 3 mmHg.   Comparison(s): No significant change from prior study.   FINDINGS   Left Ventricle: Left ventricular ejection fraction, by estimation, is 50  to 55%. The left ventricle has low normal function. The left ventricle has  no regional wall motion abnormalities. The left ventricular internal  cavity size was normal in size.  There is mild left ventricular hypertrophy. Left ventricular diastolic  parameters are consistent with Grade II diastolic dysfunction  (pseudonormalization).   Right Ventricle: The right ventricular size is normal. No increase in  right ventricular wall thickness. Right ventricular systolic function is  normal. There is normal pulmonary artery systolic pressure. The tricuspid  regurgitant velocity is 2.80 m/s, and   with an assumed right atrial pressure of 3 mmHg, the estimated right  ventricular systolic pressure is 85.6 mmHg.   Left Atrium: Left atrial size was severely dilated.   Right Atrium: Right atrial size was mildly dilated.   Pericardium: There is no evidence of pericardial effusion.   Mitral Valve: The mitral valve is grossly normal. Mild mitral valve  regurgitation.   Tricuspid Valve: The tricuspid valve is grossly normal. Tricuspid valve  regurgitation is mild to moderate.   Aortic Valve: The aortic valve is grossly normal. Aortic valve  regurgitation is not visualized. No aortic stenosis is present.   Pulmonic Valve: The pulmonic valve was not well visualized. Pulmonic valve  regurgitation is not visualized.   Aorta: The aortic root and ascending aorta are structurally normal, with  no evidence of dilitation.   Venous: The inferior vena cava is normal in size with greater than 50%  respiratory variability, suggesting right atrial pressure of 3 mmHg.   IAS/Shunts: No atrial level shunt detected by color flow Doppler.    06/30/2017  LHC/RHC  Prox RCA lesion, 55 % stenosed. Prox Cx lesion, 40 %stenosed. 1st Mrg lesion, 45 %stenosed. Mid Cx lesion, 50 %stenosed. Ost 2nd Diag lesion, 50 %stenosed. There is severe left ventricular systolic dysfunction. The left ventricular ejection fraction is 25-35% by visual estimate. LV end diastolic pressure is moderately elevated. Hemodynamic findings consistent with mild pulmonary hypertension.   Mild to moderate multivessel CAD with no obstructive lesions to explain severely reduced EF.  This was suggest nonischemic cardiomyopathy moderate LV filling pressure elevation, but relatively normal right heart pressures. She seems to be relatively euvolemic, but would benefit from afterload reduction and standing oral diuretic.  Laboratory Data:  High Sensitivity Troponin:   Recent Labs  Lab 01/10/23 1046 01/10/23 1325  TROPONINIHS 32* 2,363*      Chemistry Recent Labs  Lab 01/10/23 1046  NA 141  K 3.8  CL 106  CO2 22  GLUCOSE 244*  BUN 17  CREATININE 1.35*  CALCIUM 9.1  GFRNONAA 39*  ANIONGAP 13    Recent Labs  Lab 01/10/23 1046  PROT  6.3*  ALBUMIN 3.5  AST 18  ALT 20  ALKPHOS 50  BILITOT 1.1   Lipids No results for input(s): "CHOL", "TRIG", "HDL", "LABVLDL", "LDLCALC", "CHOLHDL" in the last 168 hours. Hematology Recent Labs  Lab 01/10/23 1046  WBC 6.0  RBC 4.49  HGB 13.2  HCT 40.6  MCV 90.4  MCH 29.4  MCHC 32.5  RDW 15.0  PLT 310   Thyroid No results for input(s): "TSH", "FREET4" in the last 168 hours. BNPNo results for input(s): "BNP", "PROBNP" in the last 168 hours.  DDimer No results for input(s): "DDIMER" in the last 168 hours.   Radiology/Studies:  DG Chest 2 View  Result Date: 01/10/2023 CLINICAL DATA:  Chest pain.  Shortness of breath. EXAM: CHEST - 2 VIEW COMPARISON:  06/10/2022 FINDINGS: Stable cardiac enlargement. Aortic atherosclerosis. Blunting of the costophrenic angles noted with mild thickening along the fissures. Pulmonary  vascular congestion. Atelectasis noted within the right lung base. IMPRESSION: 1. Cardiac enlargement and pulmonary vascular congestion. 2. Suspect small pleural effusions. 3.  Aortic Atherosclerosis (ICD10-I70.0). Electronically Signed   By: Kerby Moors M.D.   On: 01/10/2023 11:08     Assessment and Plan:   Hx mild to moderate CAD Chest pain Elevated troponin HLD  Patient presented to the emergency department today with sudden onset, squeezing chest pain.  Found with significant troponin delta, 32->2363.  ECG with RBBB, no acute ischemic changes noted. Her coronary hx notable for 2018 LHC showing mild to moderate multivessel CAD.  Patient now chest pain free. However, given known CAD hx, current symptoms and delta troponin are concerning. Newest ECG also with ST elevation in inferior and low lateral leads. Patient will need urgent LHC today. TTE ordered to assess LVEF and wall motion  Continue ASA 77m  Continue Coreg 250mBID Increase Crestor to 2063maily  Chronic HFpEF  Patient with chronic HFpEF, LVEF low normal 50-55%, grade II diastolic dysfunction on February 2023. CXR today shows cardiac enlargement, pulmonary vascular congestion, small bilateral pleural effusions.   Unclear how much of dyspnea is heart failure driven. Mild JVP elevation. Will check BNP and repeat echocardiogram.  Will given one time dose of Lasix 67m78md assess respiratory status.  Continue home GDMT including Coreg 25mg5m, Jardiance 10mg,60mresto 97-103mg B54m  PVCs  Patient with hx PVCs, improved burden to 6% on most recent heart monitor. Continue Amiodarone 100mg th62mtimes weekly and Coreg 25mg BID18mAcute respiratory failure  Patient with acute dyspnea today after several weeks of increasing shortness of breath. She is followed by pulmonology and HRCT chest 07/29/22 technically categorized as probable usual interstitial pneumonia (UIP), possible ILD changes. Patient was without significant  symptoms and so repeat high resolution CT recommended for later this year.   Unclear etiology of progressive dyspnea. Not clearly heart failure. Possibly all CAD related.  Consider CT chest if patient remains dyspneic following LHC.    DM type II  SSI ordered  CKD IIIb  Creatinine 1.35 today. This is consistent with baseline renal function. Monitor closely with LHC planned for today.  Risk Assessment/Risk Scores:    TIMI Risk Score for Unstable Angina/NSTEMI  TIMI Risk Score 5  14 day % risk of all cause mortality, new/recurrent MI, or urgent revascularization: 26    New York Heart Association (NYHA) Functional Class NYHA Class III-IV     For questions or updates, please contact [facility] [email or web address] for contact info under  Valma, [place], PA-C  01/10/2023 2:58 PM

## 2023-01-10 NOTE — Progress Notes (Addendum)
[place] for heparin Indication: IABP  Allergies  Allergen Reactions   Ezetimibe-Simvastatin Other (See Comments)    REACTION: Muscle aches (side effect)   Lipitor [Atorvastatin] Other (See Comments)    Leg weakness    Claritin [Loratadine]     Possible cause of nightmares.     Spironolactone     Elevated potassium/creatinine   Tamiflu [Oseltamivir Phosphate] Other (See Comments)    nightmares   Pravastatin Sodium Other (See Comments)    REACTION: Muscle aches (side effect)   Sulfonamide Derivatives Nausea And Vomiting      Vital Signs: Temp: 98.2 F (36.8 C) (01/30 1617) Temp Source: Axillary (01/30 1617) BP: 142/84 (01/30 1916) Pulse Rate: 73 (01/30 1722)  Labs: Recent Labs    01/10/23 1046 01/10/23 1325 01/10/23 1644  HGB 13.2  --  13.6  HCT 40.6  --  42.9  PLT 310  --  310  CREATININE 1.35*  --  1.47*  TROPONINIHS 32* 2,363*  --      CrCl cannot be calculated (Unknown ideal weight.).   Medical History: Past Medical History:  Diagnosis Date   Calcific tendonitis 2008   Treatment of left leg   Cardiomyopathy ([place]) 06/2017   a) Echo: EF 25-30%. GR 1-2 DD w/ elevated LVEDP. Mild valvular Dz; b) Cardiac MRI 2/'19: frequent PVCs (diffiuclt to interpret) - EF ~27% w/ diffuse HK.  No evidence of infarct, infiltrative Dz or myocarditis. -- ? if related to PVCs. c) f/u Echo 6/'19: EF 35-40%. Gr 1 DD. Diffuse HK.-> d) Jan 2020 EF 50 to 55%.  Mod MAC, mod LAE.  Ao Sclerosis; e) Echo 1/'21 - EF 60-65%, Gr II DD. Mild Mod MR.    Chronic combined systolic and diastolic CHF, NYHA class 2 and ACA/AHA stage C    Now essentially resolved, back to simply diastolic CHF   Coronary artery disease, non-occlusive    mild-moderate CAD 06/30/17 cath   CTS (carpal tunnel syndrome)    Diabetes mellitus    Type II   Dysrhythmia    patient said that she cant remember what it is   Frequent unifocal PVCs 01/2018   Event Monitor: NSR, W/  frequent multifocal PVCs (9%-down from 30% prior to amiodarone) and PACs.  Nighttime bradycardia suggestive of OSA.';  Zio patch September 2023: PVC burden now 6.1%.   Hiatal hernia    with reflux   Hyperlipidemia    Statin intolerant   Hypertension    Ichthyosis congenita    Intermittent claudication of both lower extremities due to atherosclerosis ([place]) 08/22/2021   LEA Dopplers 09/09/2021:  Right: Total occlusion noted in the superficial femoral artery. Atherosclerosis noted throughout extremity, see note above. Three vessel runoff.  Left: Total occlusion noted in the superficial femoral artery and/or popliteal artery. Total occlusion noted in the distal anterior tibial artery. Athereosclerosis noted throughout extremity.    NSVD (normal spontaneous vaginal delivery) 1971 & 1972   Obesity    Plantar fasciitis    Left   Pulmonary nodule    Imaged multiple times and benign appearing   Wears glasses     Assessment: Patient presents to the ED with chest pain and DOE. No AC PTA. Pharmacy consulted to dose heparin.   S/p cath 1/30 pm - pt with stent and IABP insertion. Pharmacy to restart heparin.  Goal of Therapy:  Heparin level  0.2-0.5 units/ml Monitor platelets by anticoagulation protocol: Yes   Plan:  Start heparin infusion at 850  units/hr  Will f/u heparin level in 8 hour Daily heparin level and CBC  Sherlon Handing, PharmD, BCPS Please see amion for complete clinical pharmacist phone list 01/10/2023 8:04 PM  Addendum (2210) Holding off heparin for now per dr. Claiborne Billings d/t active arterial bleeding, hematoma, holding manual pressure. Now LLE colder with no distal pulses - VVS consulted.  Plan Will continue to monitor is heparin is able to be started.  Sherlon Handing, PharmD, BCPS Please see amion for complete clinical pharmacist phone list 01/10/2023 10:09 PM

## 2023-01-10 NOTE — Progress Notes (Signed)
ANTICOAGULATION CONSULT NOTE - Initial Consult  Pharmacy Consult for heparin Indication: chest pain/ACS  Allergies  Allergen Reactions   Ezetimibe-Simvastatin Other (See Comments)    REACTION: Muscle aches (side effect)   Lipitor [Atorvastatin] Other (See Comments)    Leg weakness    Claritin [Loratadine]     Possible cause of nightmares.     Spironolactone     Elevated potassium/creatinine   Tamiflu [Oseltamivir Phosphate] Other (See Comments)    nightmares   Pravastatin Sodium Other (See Comments)    REACTION: Muscle aches (side effect)   Sulfonamide Derivatives Nausea And Vomiting      Vital Signs: Temp: 97.6 F (36.4 C) (01/30 1031) Temp Source: Oral (01/30 1031) BP: 159/97 (01/30 1515) Pulse Rate: 71 (01/30 1515)  Labs: Recent Labs    01/10/23 1046 01/10/23 1325  HGB 13.2  --   HCT 40.6  --   PLT 310  --   CREATININE 1.35*  --   TROPONINIHS 32* 2,363*    CrCl cannot be calculated (Unknown ideal weight.).   Medical History: Past Medical History:  Diagnosis Date   Calcific tendonitis 2008   Treatment of left leg   Cardiomyopathy ([place]) 06/2017   a) Echo: EF 25-30%. GR 1-2 DD w/ elevated LVEDP. Mild valvular Dz; b) Cardiac MRI 2/'19: frequent PVCs (diffiuclt to interpret) - EF ~27% w/ diffuse HK.  No evidence of infarct, infiltrative Dz or myocarditis. -- ? if related to PVCs. c) f/u Echo 6/'19: EF 35-40%. Gr 1 DD. Diffuse HK.-> d) Jan 2020 EF 50 to 55%.  Mod MAC, mod LAE.  Ao Sclerosis; e) Echo 1/'21 - EF 60-65%, Gr II DD. Mild Mod MR.    Chronic combined systolic and diastolic CHF, NYHA class 2 and ACA/AHA stage C    Now essentially resolved, back to simply diastolic CHF   Coronary artery disease, non-occlusive    mild-moderate CAD 06/30/17 cath   CTS (carpal tunnel syndrome)    Diabetes mellitus    Type II   Dysrhythmia    patient said that she cant remember what it is   Frequent unifocal PVCs 01/2018   Event Monitor: NSR, W/ frequent multifocal PVCs  (9%-down from 30% prior to amiodarone) and PACs.  Nighttime bradycardia suggestive of OSA.';  Zio patch September 2023: PVC burden now 6.1%.   Hiatal hernia    with reflux   Hyperlipidemia    Statin intolerant   Hypertension    Ichthyosis congenita    Intermittent claudication of both lower extremities due to atherosclerosis ([place]) 08/22/2021   LEA Dopplers 09/09/2021:  Right: Total occlusion noted in the superficial femoral artery. Atherosclerosis noted throughout extremity, see note above. Three vessel runoff.  Left: Total occlusion noted in the superficial femoral artery and/or popliteal artery. Total occlusion noted in the distal anterior tibial artery. Athereosclerosis noted throughout extremity.    NSVD (normal spontaneous vaginal delivery) 1971 & 1972   Obesity    Plantar fasciitis    Left   Pulmonary nodule    Imaged multiple times and benign appearing   Wears glasses     Assessment: Patient presents to the ED with chest pain and DOE. No AC PTA. Pharmacy consulted to dose heparin.   Trop 2363. Scr 1.35 with CKD at baseline. Hgb 13.2 and plt 310.   Goal of Therapy:  Heparin level 0.3-0.7 units/ml Monitor platelets by anticoagulation protocol: Yes   Plan:  Give heparin 4000 units IV x1  Start heparin infusion at 850 units/hr  Will f/u heparin level in 8 hours Monitor daily heparin level and CBC Continue to monitor H&H     Gena Fray, PharmD PGY1 Pharmacy Resident   01/10/2023 3:33 PM

## 2023-01-10 NOTE — Progress Notes (Signed)
Dr. Marcelle Smiling and cath lab staff at bedside, holding manual pressure on fem site. D/t bleeding and hematoma and LLE colder, no distal pulses.

## 2023-01-10 NOTE — Progress Notes (Signed)
MD remains at bedside; pressure continues to be held at right arterial IABP site by cath lab.  Foley placed.  Gtts being titrated to maintain SBP less than 130.

## 2023-01-10 NOTE — Progress Notes (Signed)
Was present while patient was on bipap during cardiac cath procedure. Transferred patient to 2 heart room 21. Patient requested bipap be removed so placed patient on 5lpm.

## 2023-01-10 NOTE — ED Provider Triage Note (Signed)
Emergency Medicine Provider Triage Evaluation Note  Natasha Woodard , a 82 y.o. female  was evaluated in triage.  Pt complains of chest pain and shortness of breath.  Patient with the chest pain became severe about 1 hour ago and initially rated pain at 5 out of 10 with EMS.  The talk with patient she rated chest pain at 10 of 10.  Denies that chest pain is radiating into either arms.  Denies any numbness or tingling arms.  Denies any nausea vomiting or diarrhea.  Patient is unsure of prior cardiac history but that she does see a cardiologist.  History of type 2 diabetes and chronic kidney disease stage III.  Review of Systems  Positive: As above Negative: As above  Physical Exam  BP (!) 129/100 (BP Location: Left Arm)   Pulse 69   Temp 97.6 F (36.4 C) (Oral)   Resp 16   SpO2 95%  Gen:   Awake, no distress  Resp:  Normal effort  MSK:   Moves extremities without difficulty  Other:    Medical Decision Making  Medically screening exam initiated at 10:39 AM.  Appropriate orders placed.  Natasha Woodard was informed that the remainder of the evaluation will be completed by another provider, this initial triage assessment does not replace that evaluation, and the importance of remaining in the ED until their evaluation is complete.  Dose of 0.4 sublingual nitroglycerin given in triage due to 10 of 10 chest pain.   Natasha Heller, PA-C 01/10/23 1042

## 2023-01-10 NOTE — Progress Notes (Signed)
Dr. Claiborne Billings updated family in waiting area. CL staff continues to hold manual pressure.   Foley catheter placed per MD

## 2023-01-10 NOTE — ED Notes (Signed)
Dr. Melina Copa made aware of troponin level. Pt to be prioritized for next available room. Charge RN aware of patient and actively looking for bed placement.

## 2023-01-11 ENCOUNTER — Other Ambulatory Visit (HOSPITAL_COMMUNITY): Payer: Self-pay

## 2023-01-11 ENCOUNTER — Observation Stay (HOSPITAL_COMMUNITY): Payer: Medicare Other

## 2023-01-11 ENCOUNTER — Inpatient Hospital Stay: Payer: Self-pay

## 2023-01-11 ENCOUNTER — Encounter (HOSPITAL_COMMUNITY)
Admission: EM | Disposition: A | Discharge status: Rehabilitation Facility | Payer: Self-pay | Source: Home / Self Care | Attending: Cardiology

## 2023-01-11 ENCOUNTER — Inpatient Hospital Stay (HOSPITAL_COMMUNITY): Payer: Medicare Other

## 2023-01-11 ENCOUNTER — Encounter (HOSPITAL_COMMUNITY): Payer: Self-pay | Admitting: Cardiovascular Disease

## 2023-01-11 DIAGNOSIS — G4733 Obstructive sleep apnea (adult) (pediatric): Secondary | ICD-10-CM | POA: Diagnosis not present

## 2023-01-11 DIAGNOSIS — I739 Peripheral vascular disease, unspecified: Secondary | ICD-10-CM | POA: Diagnosis not present

## 2023-01-11 DIAGNOSIS — S31103D Unspecified open wound of abdominal wall, right lower quadrant without penetration into peritoneal cavity, subsequent encounter: Secondary | ICD-10-CM | POA: Diagnosis not present

## 2023-01-11 DIAGNOSIS — R188 Other ascites: Secondary | ICD-10-CM | POA: Diagnosis not present

## 2023-01-11 DIAGNOSIS — Z9989 Dependence on other enabling machines and devices: Secondary | ICD-10-CM | POA: Diagnosis not present

## 2023-01-11 DIAGNOSIS — I97630 Postprocedural hematoma of a circulatory system organ or structure following a cardiac catheterization: Secondary | ICD-10-CM | POA: Diagnosis not present

## 2023-01-11 DIAGNOSIS — N1832 Chronic kidney disease, stage 3b: Secondary | ICD-10-CM | POA: Diagnosis present

## 2023-01-11 DIAGNOSIS — T8141XA Infection following a procedure, superficial incisional surgical site, initial encounter: Secondary | ICD-10-CM | POA: Diagnosis not present

## 2023-01-11 DIAGNOSIS — I4721 Torsades de pointes: Secondary | ICD-10-CM | POA: Diagnosis not present

## 2023-01-11 DIAGNOSIS — S31109D Unspecified open wound of abdominal wall, unspecified quadrant without penetration into peritoneal cavity, subsequent encounter: Secondary | ICD-10-CM | POA: Diagnosis not present

## 2023-01-11 DIAGNOSIS — Z87891 Personal history of nicotine dependence: Secondary | ICD-10-CM | POA: Diagnosis not present

## 2023-01-11 DIAGNOSIS — I249 Acute ischemic heart disease, unspecified: Secondary | ICD-10-CM | POA: Diagnosis not present

## 2023-01-11 DIAGNOSIS — T8189XA Other complications of procedures, not elsewhere classified, initial encounter: Secondary | ICD-10-CM | POA: Diagnosis not present

## 2023-01-11 DIAGNOSIS — I110 Hypertensive heart disease with heart failure: Secondary | ICD-10-CM | POA: Diagnosis not present

## 2023-01-11 DIAGNOSIS — I509 Heart failure, unspecified: Secondary | ICD-10-CM | POA: Diagnosis not present

## 2023-01-11 DIAGNOSIS — I272 Pulmonary hypertension, unspecified: Secondary | ICD-10-CM | POA: Diagnosis present

## 2023-01-11 DIAGNOSIS — N183 Chronic kidney disease, stage 3 unspecified: Secondary | ICD-10-CM | POA: Diagnosis not present

## 2023-01-11 DIAGNOSIS — I724 Aneurysm of artery of lower extremity: Secondary | ICD-10-CM | POA: Diagnosis not present

## 2023-01-11 DIAGNOSIS — N179 Acute kidney failure, unspecified: Secondary | ICD-10-CM | POA: Diagnosis not present

## 2023-01-11 DIAGNOSIS — D62 Acute posthemorrhagic anemia: Secondary | ICD-10-CM | POA: Diagnosis present

## 2023-01-11 DIAGNOSIS — Z6833 Body mass index (BMI) 33.0-33.9, adult: Secondary | ICD-10-CM | POA: Diagnosis not present

## 2023-01-11 DIAGNOSIS — I5023 Acute on chronic systolic (congestive) heart failure: Secondary | ICD-10-CM

## 2023-01-11 DIAGNOSIS — I462 Cardiac arrest due to underlying cardiac condition: Secondary | ICD-10-CM | POA: Diagnosis not present

## 2023-01-11 DIAGNOSIS — S31103A Unspecified open wound of abdominal wall, right lower quadrant without penetration into peritoneal cavity, initial encounter: Secondary | ICD-10-CM | POA: Diagnosis not present

## 2023-01-11 DIAGNOSIS — K7689 Other specified diseases of liver: Secondary | ICD-10-CM | POA: Diagnosis not present

## 2023-01-11 DIAGNOSIS — E8721 Acute metabolic acidosis: Secondary | ICD-10-CM | POA: Diagnosis present

## 2023-01-11 DIAGNOSIS — E1122 Type 2 diabetes mellitus with diabetic chronic kidney disease: Secondary | ICD-10-CM | POA: Diagnosis present

## 2023-01-11 DIAGNOSIS — I452 Bifascicular block: Secondary | ICD-10-CM | POA: Diagnosis present

## 2023-01-11 DIAGNOSIS — G4730 Sleep apnea, unspecified: Secondary | ICD-10-CM | POA: Diagnosis not present

## 2023-01-11 DIAGNOSIS — E440 Moderate protein-calorie malnutrition: Secondary | ICD-10-CM | POA: Diagnosis not present

## 2023-01-11 DIAGNOSIS — Y840 Cardiac catheterization as the cause of abnormal reaction of the patient, or of later complication, without mention of misadventure at the time of the procedure: Secondary | ICD-10-CM | POA: Diagnosis not present

## 2023-01-11 DIAGNOSIS — R570 Cardiogenic shock: Secondary | ICD-10-CM | POA: Diagnosis present

## 2023-01-11 DIAGNOSIS — J449 Chronic obstructive pulmonary disease, unspecified: Secondary | ICD-10-CM | POA: Diagnosis not present

## 2023-01-11 DIAGNOSIS — J9601 Acute respiratory failure with hypoxia: Secondary | ICD-10-CM | POA: Diagnosis not present

## 2023-01-11 DIAGNOSIS — I420 Dilated cardiomyopathy: Secondary | ICD-10-CM | POA: Diagnosis present

## 2023-01-11 DIAGNOSIS — R079 Chest pain, unspecified: Secondary | ICD-10-CM | POA: Diagnosis present

## 2023-01-11 DIAGNOSIS — I472 Ventricular tachycardia, unspecified: Secondary | ICD-10-CM | POA: Diagnosis not present

## 2023-01-11 DIAGNOSIS — N170 Acute kidney failure with tubular necrosis: Secondary | ICD-10-CM | POA: Diagnosis not present

## 2023-01-11 DIAGNOSIS — I214 Non-ST elevation (NSTEMI) myocardial infarction: Secondary | ICD-10-CM | POA: Diagnosis not present

## 2023-01-11 DIAGNOSIS — I4901 Ventricular fibrillation: Secondary | ICD-10-CM | POA: Diagnosis not present

## 2023-01-11 DIAGNOSIS — D649 Anemia, unspecified: Secondary | ICD-10-CM | POA: Diagnosis not present

## 2023-01-11 DIAGNOSIS — N281 Cyst of kidney, acquired: Secondary | ICD-10-CM | POA: Diagnosis not present

## 2023-01-11 DIAGNOSIS — J45909 Unspecified asthma, uncomplicated: Secondary | ICD-10-CM | POA: Diagnosis not present

## 2023-01-11 DIAGNOSIS — R0989 Other specified symptoms and signs involving the circulatory and respiratory systems: Secondary | ICD-10-CM

## 2023-01-11 DIAGNOSIS — I2510 Atherosclerotic heart disease of native coronary artery without angina pectoris: Secondary | ICD-10-CM | POA: Diagnosis not present

## 2023-01-11 DIAGNOSIS — L7632 Postprocedural hematoma of skin and subcutaneous tissue following other procedure: Secondary | ICD-10-CM | POA: Diagnosis not present

## 2023-01-11 DIAGNOSIS — I130 Hypertensive heart and chronic kidney disease with heart failure and stage 1 through stage 4 chronic kidney disease, or unspecified chronic kidney disease: Secondary | ICD-10-CM | POA: Diagnosis present

## 2023-01-11 DIAGNOSIS — S31109S Unspecified open wound of abdominal wall, unspecified quadrant without penetration into peritoneal cavity, sequela: Secondary | ICD-10-CM | POA: Diagnosis not present

## 2023-01-11 DIAGNOSIS — R5381 Other malaise: Secondary | ICD-10-CM | POA: Diagnosis not present

## 2023-01-11 DIAGNOSIS — I480 Paroxysmal atrial fibrillation: Secondary | ICD-10-CM | POA: Diagnosis present

## 2023-01-11 DIAGNOSIS — Z7984 Long term (current) use of oral hypoglycemic drugs: Secondary | ICD-10-CM | POA: Diagnosis not present

## 2023-01-11 DIAGNOSIS — I252 Old myocardial infarction: Secondary | ICD-10-CM | POA: Diagnosis not present

## 2023-01-11 DIAGNOSIS — K5730 Diverticulosis of large intestine without perforation or abscess without bleeding: Secondary | ICD-10-CM | POA: Diagnosis not present

## 2023-01-11 DIAGNOSIS — S31109A Unspecified open wound of abdominal wall, unspecified quadrant without penetration into peritoneal cavity, initial encounter: Secondary | ICD-10-CM | POA: Diagnosis not present

## 2023-01-11 DIAGNOSIS — Z95810 Presence of automatic (implantable) cardiac defibrillator: Secondary | ICD-10-CM | POA: Diagnosis not present

## 2023-01-11 DIAGNOSIS — E1151 Type 2 diabetes mellitus with diabetic peripheral angiopathy without gangrene: Secondary | ICD-10-CM | POA: Diagnosis not present

## 2023-01-11 DIAGNOSIS — I9789 Other postprocedural complications and disorders of the circulatory system, not elsewhere classified: Secondary | ICD-10-CM | POA: Diagnosis not present

## 2023-01-11 DIAGNOSIS — I5021 Acute systolic (congestive) heart failure: Secondary | ICD-10-CM | POA: Diagnosis not present

## 2023-01-11 DIAGNOSIS — I340 Nonrheumatic mitral (valve) insufficiency: Secondary | ICD-10-CM | POA: Diagnosis present

## 2023-01-11 DIAGNOSIS — Y838 Other surgical procedures as the cause of abnormal reaction of the patient, or of later complication, without mention of misadventure at the time of the procedure: Secondary | ICD-10-CM | POA: Diagnosis not present

## 2023-01-11 DIAGNOSIS — I2121 ST elevation (STEMI) myocardial infarction involving left circumflex coronary artery: Secondary | ICD-10-CM | POA: Diagnosis present

## 2023-01-11 LAB — BASIC METABOLIC PANEL
Anion gap: 16 — ABNORMAL HIGH (ref 5–15)
Anion gap: 16 — ABNORMAL HIGH (ref 5–15)
Anion gap: 12 (ref 5–15)
BUN: 20 mg/dL (ref 8–23)
BUN: 17 mg/dL (ref 8–23)
BUN: 18 mg/dL (ref 8–23)
CO2: 21 mmol/L — ABNORMAL LOW (ref 22–32)
CO2: 23 mmol/L (ref 22–32)
CO2: 25 mmol/L (ref 22–32)
Calcium: 8.5 mg/dL — ABNORMAL LOW (ref 8.9–10.3)
Calcium: 8.8 mg/dL — ABNORMAL LOW (ref 8.9–10.3)
Calcium: 8.3 mg/dL — ABNORMAL LOW (ref 8.9–10.3)
Chloride: 103 mmol/L (ref 98–111)
Chloride: 102 mmol/L (ref 98–111)
Chloride: 100 mmol/L (ref 98–111)
Creatinine, Ser: 1.38 mg/dL — ABNORMAL HIGH (ref 0.44–1.00)
Creatinine, Ser: 1.44 mg/dL — ABNORMAL HIGH (ref 0.44–1.00)
Creatinine, Ser: 1.45 mg/dL — ABNORMAL HIGH (ref 0.44–1.00)
GFR, Estimated: 38 mL/min — ABNORMAL LOW (ref >60)
GFR, Estimated: 36 mL/min — ABNORMAL LOW (ref >60)
GFR, Estimated: 36 mL/min — ABNORMAL LOW (ref >60)
Glucose, Bld: 164 mg/dL — ABNORMAL HIGH (ref 70–99)
Glucose, Bld: 105 mg/dL — ABNORMAL HIGH (ref 70–99)
Glucose, Bld: 180 mg/dL — ABNORMAL HIGH (ref 70–99)
Potassium: 3.9 mmol/L (ref 3.5–5.1)
Potassium: 4.7 mmol/L (ref 3.5–5.1)
Potassium: 3.6 mmol/L (ref 3.5–5.1)
Sodium: 140 mmol/L (ref 135–145)
Sodium: 141 mmol/L (ref 135–145)
Sodium: 137 mmol/L (ref 135–145)

## 2023-01-11 LAB — CBC
HCT: 32.8 % — ABNORMAL LOW (ref 36.0–46.0)
HCT: 32.5 % — ABNORMAL LOW (ref 36.0–46.0)
Hemoglobin: 10.7 g/dL — ABNORMAL LOW (ref 12.0–15.0)
Hemoglobin: 10.3 g/dL — ABNORMAL LOW (ref 12.0–15.0)
MCH: 29.2 pg (ref 26.0–34.0)
MCH: 29 pg (ref 26.0–34.0)
MCHC: 32.6 g/dL (ref 30.0–36.0)
MCHC: 31.7 g/dL (ref 30.0–36.0)
MCV: 89.4 fL (ref 80.0–100.0)
MCV: 91.5 fL (ref 80.0–100.0)
Platelets: 249 10*3/uL (ref 150–400)
Platelets: 234 10*3/uL (ref 150–400)
RBC: 3.67 MIL/uL — ABNORMAL LOW (ref 3.87–5.11)
RBC: 3.55 MIL/uL — ABNORMAL LOW (ref 3.87–5.11)
RDW: 15 % (ref 11.5–15.5)
RDW: 14.9 % (ref 11.5–15.5)
WBC: 8.3 10*3/uL (ref 4.0–10.5)
WBC: 12.5 10*3/uL — ABNORMAL HIGH (ref 4.0–10.5)
nRBC: 0 % (ref 0.0–0.2)
nRBC: 0 % (ref 0.0–0.2)

## 2023-01-11 LAB — POCT I-STAT EG7
Acid-Base Excess: 3 mmol/L — ABNORMAL HIGH (ref 0.0–2.0)
Bicarbonate: 29 mmol/L — ABNORMAL HIGH (ref 20.0–28.0)
Calcium, Ion: 1.15 mmol/L (ref 1.15–1.40)
HCT: 27 % — ABNORMAL LOW (ref 36.0–46.0)
Hemoglobin: 9.2 g/dL — ABNORMAL LOW (ref 12.0–15.0)
O2 Saturation: 84 %
Patient temperature: 98.4
Potassium: 3.7 mmol/L (ref 3.5–5.1)
Sodium: 136 mmol/L (ref 135–145)
TCO2: 30 mmol/L (ref 22–32)
pCO2, Ven: 48.6 mmHg (ref 44–60)
pH, Ven: 7.383 (ref 7.25–7.43)
pO2, Ven: 50 mmHg — ABNORMAL HIGH (ref 32–45)

## 2023-01-11 LAB — GLUCOSE, CAPILLARY
Glucose-Capillary: 102 mg/dL — ABNORMAL HIGH (ref 70–99)
Glucose-Capillary: 170 mg/dL — ABNORMAL HIGH (ref 70–99)
Glucose-Capillary: 99 mg/dL (ref 70–99)
Glucose-Capillary: 192 mg/dL — ABNORMAL HIGH (ref 70–99)

## 2023-01-11 LAB — LACTIC ACID, PLASMA
Lactic Acid, Venous: 2.2 mmol/L (ref 0.5–1.9)
Lactic Acid, Venous: 4.1 mmol/L (ref 0.5–1.9)

## 2023-01-11 LAB — COOXEMETRY PANEL
Carboxyhemoglobin: 2 % — ABNORMAL HIGH (ref 0.5–1.5)
Methemoglobin: <0.7 % (ref 0.0–1.5)
O2 Saturation: 65.1 %
Total hemoglobin: 9.6 g/dL — ABNORMAL LOW (ref 12.0–16.0)

## 2023-01-11 LAB — LIPID PANEL
Cholesterol: 87 mg/dL (ref 0–200)
HDL: 33 mg/dL — ABNORMAL LOW (ref >40)
LDL Cholesterol: 42 mg/dL (ref 0–99)
Total CHOL/HDL Ratio: 2.6 RATIO
Triglycerides: 59 mg/dL (ref <150)
VLDL: 12 mg/dL (ref 0–40)

## 2023-01-11 LAB — TROPONIN I (HIGH SENSITIVITY): Troponin I (High Sensitivity): >24000 ng/L (ref <18)

## 2023-01-11 LAB — MAGNESIUM: Magnesium: 2 mg/dL (ref 1.7–2.4)

## 2023-01-11 LAB — POCT ACTIVATED CLOTTING TIME: Activated Clotting Time: 201 seconds

## 2023-01-11 SURGERY — PSEUDOANERYSM COMPRESSION
Laterality: Right

## 2023-01-11 MED ORDER — FUROSEMIDE 10 MG/ML IJ SOLN
40.0000 mg | Freq: Two times a day (BID) | INTRAMUSCULAR | Status: DC
  Administered 2023-01-11 – 2023-01-12 (×3): 40 mg via INTRAVENOUS
  Filled 2023-01-11 (×3): qty 4

## 2023-01-11 MED ORDER — POTASSIUM CHLORIDE CRYS ER 20 MEQ PO TBCR
40.0000 meq | EXTENDED_RELEASE_TABLET | Freq: Once | ORAL | Status: AC
  Administered 2023-01-11: 40 meq via ORAL
  Filled 2023-01-11: qty 2

## 2023-01-11 MED ORDER — POTASSIUM CHLORIDE CRYS ER 20 MEQ PO TBCR: 20.0000 meq | EXTENDED_RELEASE_TABLET | Freq: Once | ORAL | Status: DC

## 2023-01-11 MED ORDER — LIDOCAINE HCL (PF) 1 % IJ SOLN
INTRAMUSCULAR | Status: AC
  Filled 2023-01-11: qty 30

## 2023-01-11 MED ORDER — AMIODARONE HCL 200 MG PO TABS
400.0000 mg | ORAL_TABLET | Freq: Every day | ORAL | Status: DC
  Administered 2023-01-11: 400 mg via ORAL
  Filled 2023-01-11: qty 2

## 2023-01-11 MED ORDER — SODIUM CHLORIDE 0.9% FLUSH
3.0000 mL | Freq: Two times a day (BID) | INTRAVENOUS | Status: DC
  Administered 2023-01-11 – 2023-02-06 (×33): 3 mL via INTRAVENOUS

## 2023-01-11 MED ORDER — ACETAMINOPHEN 325 MG PO TABS: 650.0000 mg | ORAL_TABLET | ORAL | Status: DC | PRN

## 2023-01-11 MED ORDER — SODIUM CHLORIDE 0.9 % IV SOLN: INTRAVENOUS | Status: AC

## 2023-01-11 MED ORDER — LABETALOL HCL 5 MG/ML IV SOLN: 10.0000 mg | INTRAVENOUS | Status: AC | PRN

## 2023-01-11 MED ORDER — SODIUM CHLORIDE 0.9 % IV SOLN: INTRAVENOUS | Status: DC

## 2023-01-11 MED ORDER — CHLORHEXIDINE GLUCONATE CLOTH 2 % EX PADS
6.0000 | MEDICATED_PAD | Freq: Every day | CUTANEOUS | Status: DC
  Administered 2023-01-11 – 2023-02-19 (×39): 6 via TOPICAL

## 2023-01-11 MED ORDER — DIGOXIN 125 MCG PO TABS
0.0625 mg | ORAL_TABLET | Freq: Every day | ORAL | Status: DC
  Administered 2023-01-11 – 2023-01-13 (×3): 0.0625 mg via ORAL
  Filled 2023-01-11 (×4): qty 1

## 2023-01-11 MED ORDER — MAGNESIUM SULFATE 2 GM/50ML IV SOLN
2.0000 g | Freq: Once | INTRAVENOUS | Status: AC
  Administered 2023-01-11: 2 g via INTRAVENOUS
  Filled 2023-01-11: qty 50

## 2023-01-11 MED ORDER — SODIUM CHLORIDE 0.9 % IV SOLN
250.0000 mL | INTRAVENOUS | Status: DC | PRN
  Administered 2023-01-23: 250 mL via INTRAVENOUS

## 2023-01-11 MED ORDER — SODIUM CHLORIDE 0.9% FLUSH
10.0000 mL | Freq: Two times a day (BID) | INTRAVENOUS | Status: DC
  Administered 2023-01-11 – 2023-01-15 (×9): 10 mL
  Administered 2023-01-17: 20 mL
  Administered 2023-01-18 – 2023-02-06 (×20): 10 mL

## 2023-01-11 MED ORDER — ONDANSETRON HCL 4 MG/2ML IJ SOLN: 4.0000 mg | Freq: Four times a day (QID) | INTRAMUSCULAR | Status: DC | PRN

## 2023-01-11 MED ORDER — POTASSIUM CHLORIDE 10 MEQ/100ML IV SOLN
10.0000 meq | INTRAVENOUS | Status: AC
  Administered 2023-01-11 (×4): 10 meq via INTRAVENOUS
  Filled 2023-01-11 (×2): qty 100

## 2023-01-11 MED ORDER — MORPHINE SULFATE (PF) 2 MG/ML IV SOLN
2.0000 mg | Freq: Once | INTRAVENOUS | Status: DC
  Filled 2023-01-11: qty 1

## 2023-01-11 MED ORDER — SODIUM CHLORIDE 0.9% FLUSH
10.0000 mL | INTRAVENOUS | Status: DC | PRN
  Administered 2023-02-09: 20 mL

## 2023-01-11 MED ORDER — HYDRALAZINE HCL 20 MG/ML IJ SOLN: 10.0000 mg | INTRAMUSCULAR | Status: AC | PRN

## 2023-01-11 MED ORDER — ASPIRIN 81 MG PO CHEW: 81.0000 mg | CHEWABLE_TABLET | ORAL | Status: DC

## 2023-01-11 MED ORDER — NOREPINEPHRINE 4 MG/250ML-% IV SOLN
0.0000 ug/min | INTRAVENOUS | Status: DC
  Administered 2023-01-11: 1 ug/min via INTRAVENOUS
  Administered 2023-01-12: 2 ug/min via INTRAVENOUS
  Administered 2023-01-13: 6 ug/min via INTRAVENOUS
  Administered 2023-01-13: 2 ug/min via INTRAVENOUS
  Administered 2023-01-14: 8 ug/min via INTRAVENOUS
  Administered 2023-01-14: 4 ug/min via INTRAVENOUS
  Administered 2023-01-14 – 2023-01-15 (×4): 8 ug/min via INTRAVENOUS
  Administered 2023-01-16: 7 ug/min via INTRAVENOUS
  Administered 2023-01-16: 4 ug/min via INTRAVENOUS
  Administered 2023-01-17: 5 ug/min via INTRAVENOUS
  Administered 2023-01-18: 3 ug/min via INTRAVENOUS
  Administered 2023-01-19: 4 ug/min via INTRAVENOUS
  Filled 2023-01-11 (×12): qty 250

## 2023-01-11 MED ORDER — THROMBIN FOR PERCUTANEOUS TREATMENT OF PSEUDOANEURYSM (5000UNITS/10ML)
5000.0000 [IU] | Freq: Once | PERCUTANEOUS | Status: AC
  Administered 2023-01-11: 3000 [IU] via PERCUTANEOUS
  Filled 2023-01-11: qty 1

## 2023-01-11 MED ORDER — MILRINONE LACTATE IN DEXTROSE 20-5 MG/100ML-% IV SOLN
0.2500 ug/kg/min | INTRAVENOUS | Status: DC
  Administered 2023-01-11 – 2023-01-17 (×8): 0.25 ug/kg/min via INTRAVENOUS
  Filled 2023-01-11 (×9): qty 100

## 2023-01-11 MED ORDER — SODIUM CHLORIDE 0.9% FLUSH
3.0000 mL | INTRAVENOUS | Status: DC | PRN
  Administered 2023-01-27: 3 mL via INTRAVENOUS

## 2023-01-11 MED FILL — Cefazolin Sodium-Dextrose IV Solution 2 GM/100ML-4%: INTRAVENOUS | Qty: 100 | Status: AC

## 2023-01-11 NOTE — Progress Notes (Signed)
Pt entered room at 1335 and vitals were obtained. Pt's O2 100% on 2L, BP: 142/76, pulse: 70 and respiratory rate: 12.  At 1345 ultrasound imaging was obtained of the artery. At 1410 the pt's R groin was cleaned and sterilely draped. At 1412 Dr. Carlis Abbott injected 10 mg of 1% lidocaine into the R groin. At 1418 thrombin was injected into the R groin with ultrasound guidance by Dr. Carlis Abbott. At 1420 follow-up ultrasound imaging was obtained of the artery along with an additional injection of thrombin. Total of 3 cc of Thrombin was injected during the procedure. Case ended at 1430 and total of 6 hours of bedrest per Dr. Carlis Abbott. Vitals obtained post-procedure.

## 2023-01-11 NOTE — Significant Event (Signed)
I was called to bedside to evaluate expanding hematoma surrounding IABP access site.  She had left circumflex/OM revascularization earlier and preprocedural echo had signs of papillary muscle dysfunction with severe MR.  Thus post revascularization and balloon pump was left in place for hemodynamic support.    We held pressure and expressed hematoma for >2 hours, however still bleeding around both access sites. ACT at that time was 271. Hemodynamically stable and requiring IV nitro for elevated MAPs. >1200 mL UOP since revascularization. Unfortunately, with IABP in place, developed signs of limb ischemia with cold foot/shin and absent pulses.   Given improving stability, no longer on bipap, stable BP decision was made to pull IABP once ACT was < 200 and pressure applied. PA catheter was also removed because heavy oozing/bleeding happening around the access site.   She remained hemodynamically stable after IABP removal. Will continue to check serial h/h.  Upon removal of IABP sheath, posterior tibial pulses became dopplerable again.  Lactic acid was 2.0.  Hemoglobin was 12.3.  INR was 1.4.  ACT 196.  Creatinine 1.38.   Billey Chang, MD Cardiology Fellow

## 2023-01-11 NOTE — Progress Notes (Signed)
Lower extremity arterial duplex right study completed.  Preliminary results relayed to [facility], MD.   See CV Proc for preliminary results report.   Darlin Coco, RDMS, RVT

## 2023-01-11 NOTE — Consult Note (Addendum)
Advanced Heart Failure Team Consult Note   Primary Physician: Tonia Ghent, MD PCP-Cardiologist:  Dr. Ellyn Hack  [facility]: Dr. Haroldine Laws   Reason for Consultation: Acute MI Cardiogenic Shock, Severe Ischemic MR  HPI:    ALCIE RUNIONS is seen today for evaluation of acute MI cardiogenic shock and severe MR at the request of Dr. Debara Pickett, Cardiology.   82 y/o AAF w/ prior h/o dilated, nonischemic, CM. Echo in 2018 showed reduced LVEF, 20-25% and mild MR. LHC showed mild-mod nonobstructive CAD. CM felt PVC mediated as holter showed 13% PVC burden. She was stated on amiodarone for suppression. Repeat holter showed improvement w/ only 1% PVC burden. Sleep study c/w mild sleep apnea, AHI 5.9/hr. F/u echo in 2019 EF improved to 35-40%. Echo 2020 EF 50-55%, mod MAC w/ mild MR. Echo 2021 EF 60-65%, mild-mod MR.  Presented w/ CP. Initially ruled in for NSTEMI, Hs trop >4500. Echo w/ severely reduced LVEF 20-25% w/ akinesis of the apical to mid lateral wall and apex, all other segments hypokinetic, severe eccentric posteriorly directed mitral regurgitation (suspect ischemic MR (IIIB) due to restricted PMVL). RV mildly reduced. LHC showed occluded distal occlusion of the circumflex marginal vessel, treated w/ PCI + DES. LAD and RCA w/ diffuse mild-mod nonobstructive dz. RHC demonstrated moderately pulmonary HTN, mRA 15, PA: 74/35; mean 48, and low output w/ CI 2.1 L/min/m. IABP placed in cath lab.   Overnight, developed expanding hematoma surrounding IABP access site + developed s/o limb ischemia with cold foot/shin and absent pulses. IABP removed.   AHF team asked to see for low output HF/shock. Lactic acid this morning post IABP removal elevated at 2.2. Scr stable 1.47>>1.32. SBPs 120s.   She denies any current CP. Feels SOB. O2 sats 92% on . Just received 40 mg IV Lasix. No central access. Currently denies leg pain. Leg luke warm. + weak DPs bilaterally. LE arterial dopplers ordered.    Son  present at beside.    R/LHC 01/10/23  Right Heart Pressures Hemodynamic findings consistent with moderate pulmonary hypertension. Elevated LV EDP consistent with volume overload.  Right Atrium Initial AO: 157/90 LV: 149/31  Pullback: LV: 147/27 AO: 160/86  RA: A-wave 18, V wave 16; mean 15  RV: 69/19 PA: 74/35; mean 48  Oxygen saturation in the PA 62% and in the ER 96%.  By the Fick method, cardiac output 3.7 L/min with cardiac index 2.1 L/min/m.  PVR: 5.72    Diagnostic Dominance: Right  Intervention - PCI + DES to distal LCx       Echo 01/10/23   1. Akinesis of the apical to mid lateral wall and apex. All other  segments are hypokinetic. Left ventricular ejection fraction, by  estimation, is 20 to 25%. Left ventricular ejection fraction by 2D MOD  biplane is 24.0 %. The left ventricle has severely  decreased function. The left ventricle demonstrates regional wall motion  abnormalities (see scoring diagram/findings for description). The left  ventricular internal cavity size was mildly dilated. Left ventricular  diastolic function could not be  evaluated.   2. Right ventricular systolic function is mildly reduced. The right  ventricular size is mildly enlarged.   3. Left atrial size was mild to moderately dilated.   4. Severe eccentric posteriorly directed mitral regurgitation. Given  lateral WMA, suspect this is ischemic MR (IIIB) due to restricted PMVL.  The mitral valve is grossly normal. Severe mitral valve regurgitation. No  evidence of mitral stenosis.   5. The  aortic valve is grossly normal. Aortic valve regurgitation is not  visualized.   6. The inferior vena cava is normal in size with <50% respiratory  variability, suggesting right atrial pressure of 8 mmHg.   Comparison(s): Prior images unable to be directly viewed, comparison made  by report only. Changes from prior study are noted. The left ventricular  function is significantly worse. Severe MR  now present.   Review of Systems: [y] = yes, [ ]  = no   General: Weight gain [ ] ; Weight loss [ ] ; Anorexia [ ] ; Fatigue [ ] ; Fever [ ] ; Chills [ ] ; Weakness [ ]   Cardiac: Chest pain/pressure [ ] ; Resting SOB [Y ]; Exertional SOB [ ] ; Orthopnea [ ] ; Pedal Edema [ ] ; Palpitations [ ] ; Syncope [ ] ; Presyncope [ ] ; Paroxysmal nocturnal dyspnea[ ]   Pulmonary: Cough [ ] ; Wheezing[ ] ; Hemoptysis[ ] ; Sputum [ ] ; Snoring [ ]   GI: Vomiting[ ] ; Dysphagia[ ] ; Melena[ ] ; Hematochezia [ ] ; Heartburn[ ] ; Abdominal pain [ ] ; Constipation [ ] ; Diarrhea [ ] ; BRBPR [ ]   GU: Hematuria[ ] ; Dysuria [ ] ; Nocturia[ ]   Vascular: Pain in legs with walking [ ] ; Pain in feet with lying flat [ ] ; Non-healing sores [ ] ; Stroke [ ] ; TIA [ ] ; Slurred speech [ ] ;  Neuro: Headaches[ ] ; Vertigo[ ] ; Seizures[ ] ; Paresthesias[ ] ;Blurred vision [ ] ; Diplopia [ ] ; Vision changes [ ]   Ortho/Skin: Arthritis [ ] ; Joint pain [ ] ; Muscle pain [ ] ; Joint swelling [ ] ; Back Pain [ ] ; Rash [ ]   Psych: Depression[ ] ; Anxiety[ ]   Heme: Bleeding problems [ ] ; Clotting disorders [ ] ; Anemia [ ]   Endocrine: Diabetes [ ] ; Thyroid dysfunction[ ]   Home Medications Prior to Admission medications   Medication Sig Start Date End Date Taking? Authorizing Provider  acetaminophen (TYLENOL) 500 MG tablet Take 500 mg by mouth every 6 (six) hours as needed.    [provider]  amiodarone (PACERONE) 200 MG tablet TAKE ONE-HALF TABLET BY MOUTH EVERY TUES/THURS/SAT Patient taking differently: Take 100 mg by mouth See admin instructions. TAKE ONE-HALF TABLET BY MOUTH EVERY TUES/THURS/SAT 11/10/22   Tonia Ghent, MD  aspirin 81 MG tablet Take 1 tablet (81 mg total) by mouth daily. 09/14/17   Tonia Ghent, MD  carvedilol (COREG) 25 MG tablet TAKE 1 TABLET BY MOUTH TWICE DAILY 11/10/22   Tonia Ghent, MD  Cholecalciferol (VITAMIN D) 1000 UNITS capsule Take 1,000 Units by mouth daily.    [provider]  doxycycline (VIBRA-TABS) 100  MG tablet Take 1 tablet (100 mg total) by mouth 2 (two) times daily. 11/23/22   Copland, Frederico Hamman, MD  Fluticasone-Umeclidin-Vilant (TRELEGY ELLIPTA) 100-62.5-25 MCG/ACT AEPB Inhale 1 puff into the lungs daily. Patient not taking: Reported on 12/02/2022 10/10/22   Deneise Lever, MD  furosemide (LASIX) 20 MG tablet Take 1 tablet (20 mg total) by mouth daily. 06/17/22   Loel Dubonnet, NP  glucose blood (ACCU-CHEK AVIVA PLUS) test strip USE TO TEST BLOOD SUGAR ONCE DAILY FOR DM E11.9 09/27/21   Tonia Ghent, MD  Ipratropium-Albuterol (COMBIVENT RESPIMAT) 20-100 MCG/ACT AERS respimat Inhale 1 puff into the lungs every 6 (six) hours as needed for wheezing or shortness of breath. 01/04/23   Ria Bush, MD  JARDIANCE 10 MG TABS tablet TAKE 1/2 TABLET(5 MG) BY MOUTH DAILY 08/12/22   Leonie Man, MD  Lancets (ACCU-CHEK MULTICLIX) lancets USE ONE LANCET TO TEST BLOOD GLUCOSE DAILY FOR DM 250.00  05/25/22   Tonia Ghent, MD  metFORMIN (GLUCOPHAGE) 500 MG tablet TAKE 1/2 TABLET BY MOUTH DAILY WITH A MEAL 09/27/22   Tonia Ghent, MD  rosuvastatin (CRESTOR) 10 MG tablet TAKE 1 TABLET(10 MG) BY MOUTH DAILY Patient taking differently: Take 10 mg by mouth daily. 10/18/22   Tonia Ghent, MD  sacubitril-valsartan (ENTRESTO) 97-103 MG Take 1 tablet by mouth 2 (two) times daily. 10/24/22   Leonie Man, MD  traMADol Veatrice Bourbon) 50 MG tablet TAKE 1 TABLET(50 MG) BY MOUTH THREE TIMES DAILY AS NEEDED 11/16/22   Tonia Ghent, MD    Past Medical History: Past Medical History:  Diagnosis Date   Calcific tendonitis 2008   Treatment of left leg   Cardiomyopathy ([place]) 06/2017   a) Echo: EF 25-30%. GR 1-2 DD w/ elevated LVEDP. Mild valvular Dz; b) Cardiac MRI 2/'19: frequent PVCs (diffiuclt to interpret) - EF ~27% w/ diffuse HK.  No evidence of infarct, infiltrative Dz or myocarditis. -- ? if related to PVCs. c) f/u Echo 6/'19: EF 35-40%. Gr 1 DD. Diffuse HK.-> d) Jan 2020 EF 50 to 55%.  Mod MAC,  mod LAE.  Ao Sclerosis; e) Echo 1/'21 - EF 60-65%, Gr II DD. Mild Mod MR.    Chronic combined systolic and diastolic CHF, NYHA class 2 and ACA/AHA stage C    Now essentially resolved, back to simply diastolic CHF   Coronary artery disease, non-occlusive    mild-moderate CAD 06/30/17 cath   CTS (carpal tunnel syndrome)    Diabetes mellitus    Type II   Dysrhythmia    patient said that she cant remember what it is   Frequent unifocal PVCs 01/2018   Event Monitor: NSR, W/ frequent multifocal PVCs (9%-down from 30% prior to amiodarone) and PACs.  Nighttime bradycardia suggestive of OSA.';  Zio patch September 2023: PVC burden now 6.1%.   Hiatal hernia    with reflux   Hyperlipidemia    Statin intolerant   Hypertension    Ichthyosis congenita    Intermittent claudication of both lower extremities due to atherosclerosis ([place]) 08/22/2021   LEA Dopplers 09/09/2021:  Right: Total occlusion noted in the superficial femoral artery. Atherosclerosis noted throughout extremity, see note above. Three vessel runoff.  Left: Total occlusion noted in the superficial femoral artery and/or popliteal artery. Total occlusion noted in the distal anterior tibial artery. Athereosclerosis noted throughout extremity.    NSVD (normal spontaneous vaginal delivery) 1971 & 1972   Obesity    Plantar fasciitis    Left   Pulmonary nodule    Imaged multiple times and benign appearing   Wears glasses     Past Surgical History: Past Surgical History:  Procedure Laterality Date   ABDOMINAL HYSTERECTOMY  1975   TAH-BSO   ABSCESS DRAINAGE  1972   right breast   BREAST BIOPSY Left 01/14/2009   Stereo- Benign   CARDIAC MRI  01/2018   Difficult to interpret 2/2 PVCs. Normal LV size - EF ~27% with diffuse HK. Mild RV dilation - normal fxn. -- NO MYOCARDIAL LGI => no definitive evidence of prior MI, Infiltrative Dz or Myocarditis -- suspect NICM, possibly related to PVCs.   CARPAL TUNNEL RELEASE Right 07/02/2014    Procedure: RIGHT CARPAL TUNNEL RELEASE;  Surgeon: Wynonia Sours, MD;  Location: [place];  Service: Orthopedics;  Laterality: Right;   CARPAL TUNNEL RELEASE Left 06/11/2015   Procedure: LEFT CARPAL TUNNEL RELEASE;  Surgeon: Daryll Brod, MD;  Location: [place];  Service: Orthopedics;  Laterality: Left;  REGIONAL/FAB   COLONOSCOPY     corn removal  1968   right   CORONARY STENT INTERVENTION N/A 01/10/2023   Procedure: CORONARY STENT INTERVENTION;  Surgeon: Troy Sine, MD;  Location: [place] [facility];  Service: Cardiovascular;  Laterality: N/A;   Holter Monitor  06/2017   ~17,000 PVC beats - majority were singlets, some couplets. 44 brief 3-7 beat runs of NSVT. Also noted were less frequent PACs with 11 runs (longest 15 beats)   IABP INSERTION Right 01/10/2023   Procedure: IABP Insertion;  Surgeon: Troy Sine, MD;  Location: [place] [facility];  Service: Cardiovascular;  Laterality: Right;   OPEN REDUCTION INTERNAL FIXATION (ORIF) DISTAL RADIAL FRACTURE Left 07/21/2017   Procedure: OPEN REDUCTION INTERNAL FIXATION (ORIF) LEFT DISTAL RADIAL FRACTURE;  Surgeon: Renette Butters, MD;  Location: [place];  Service: Orthopedics;  Laterality: Left;   RIGHT/LEFT HEART CATH AND CORONARY ANGIOGRAPHY N/A 06/30/2017   Procedure: Right/Left Heart Cath and Coronary Angiography;  Surgeon: Leonie Man, MD;  Location: [facility] INVASIVE CV LAB:  pRCA 55%, pCx 40%, OM1 45%, mCx 50%, D2 50% - LVEF 25-35%. Moderately elevated LVEDP(26 mmHg with PCWP 16 mmHg).  FICK CO/CI: 4.47/2.48. PA pressures 47/14 mmHg with a mean of 27 mm.   RIGHT/LEFT HEART CATH AND CORONARY ANGIOGRAPHY N/A 01/10/2023   Procedure: RIGHT/LEFT HEART CATH AND CORONARY ANGIOGRAPHY;  Surgeon: Troy Sine, MD;  Location: [place] [facility];  Service: Cardiovascular;  Laterality: N/A;   ROTATOR CUFF REPAIR     left shoulder   TRANSTHORACIC ECHOCARDIOGRAM  06/2017   EF 25 and 30%. GR 1 DD. Mild diastolic  dysfunction with elevated LVEDP. Mild valvular disease.   TRANSTHORACIC ECHOCARDIOGRAM  11'18, 6/'19    a) EF remains 30-35%.  Diffuse hypokinesis noted.  Severe LA dilation; b) Improved EF 35-40%.  Diffuse HK.  GR 1 DD.  Moderate TR.  Mild RV dilation.   TRANSTHORACIC ECHOCARDIOGRAM  01/28/2022   Follow-up echo: EF 50 to 55%.  No RWMA.  GRII DD.  Severe LA dilation.  Mild RA dilation with normal PAP and RAP.  Mild to moderate TR (no significant change noted)   Zio patch  08/2022   3-day: Currently NSR (HR range 47-91 with average 64 bpm) 1 F AVB with IVCD/BBB.  Rare isolated PACs (<1%).  Frequent PVCs (6.1% with rare couplets and triplets.  Also bigeminy.  2 atrial runs 8 beats 5 2 seconds.  No sustained arrhythmias.   ZIO Patch 14 d Event Monitor  10/2018   Results as below < 1% PVC.  (Notably improved after starting amiodarone)    Family History: Family History  Problem Relation Age of Onset   Heart disease Mother        s/p pacemaker   Diabetes Mother    Heart disease Father    Diabetes Father    Heart disease Brother        MI   Cancer Brother        Lung   Hypertension Other    Colon cancer Neg Hx    Breast cancer Neg Hx     Social History: Social History   Socioeconomic History   Marital status: Married    Spouse name: Not on file   Number of children: 2   Years of education: Not on file   Highest education level: Not on file  Occupational History  Employer: RETIRED  Tobacco Use   Smoking status: Former    Years: 28.00    Types: Cigarettes    Quit date: 12/12/1993    Years since quitting: 29.1   Smokeless tobacco: Never  Vaping Use   Vaping Use: Never used  Substance and Sexual Activity   Alcohol use: No    Alcohol/week: 0.0 standard drinks of alcohol   Drug use: No   Sexual activity: Never  Other Topics Concern   Not on file  Social History Narrative   Two kids, two grandchildren, two great grandchildren.    Married 1969   Retired.    Social  Determinants of Health   Financial Resource Strain: Low Risk  (05/12/2022)   Overall Financial Resource Strain (CARDIA)    Difficulty of Paying Living Expenses: Not hard at all  Food Insecurity: No Food Insecurity (05/12/2022)   Hunger Vital Sign    Worried About Running Out of Food in the Last Year: Never true    Ran Out of Food in the Last Year: Never true  Transportation Needs: No Transportation Needs (05/12/2022)   PRAPARE - Hydrologist (Medical): No    Lack of Transportation (Non-Medical): No  Physical Activity: Sufficiently Active (05/12/2022)   Exercise Vital Sign    Days of Exercise per Week: 6 days    Minutes of Exercise per Session: 60 min  Stress: No Stress Concern Present (05/12/2022)   [place]    Feeling of Stress : Not at all  Social Connections: Not on file    Allergies:  Allergies  Allergen Reactions   Ezetimibe-Simvastatin Other (See Comments)    REACTION: Muscle aches (side effect)   Lipitor [Atorvastatin] Other (See Comments)    Leg weakness    Claritin [Loratadine]     Possible cause of nightmares.     Spironolactone     Elevated potassium/creatinine   Tamiflu [Oseltamivir Phosphate] Other (See Comments)    nightmares   Pravastatin Sodium Other (See Comments)    REACTION: Muscle aches (side effect)   Sulfonamide Derivatives Nausea And Vomiting    Objective:    Vital Signs:   Temp:  [97.6 F (36.4 C)-99.5 F (37.5 C)] 99.5 F (37.5 C) (01/31 0726) Pulse Rate:  [57-226] 63 (01/31 0800) Resp:  [10-39] 19 (01/31 0800) BP: (73-168)/(56-115) 137/69 (01/31 0800) SpO2:  [92 %-100 %] 100 % (01/31 0800) Arterial Line BP: (125-167)/(61-79) 136/72 (01/30 2300) FiO2 (%):  [40 %] 40 % (01/30 1722) Weight:  [67.4 kg] 67.4 kg (01/31 0409)    Weight change: Filed Weights   01/11/23 0409  Weight: 67.4 kg    Intake/Output:   Intake/Output Summary (Last 24 hours) at  01/11/2023 0921 Last data filed at 01/11/2023 0700 Gross per 24 hour  Intake 841.14 ml  Output 1975 ml  Net -1133.86 ml      Physical Exam    General:  elderly, fatigued appearing. No resp difficulty HEENT: normal Neck: supple. JVP 12 cm . Carotids 2+ bilat; no bruits. No lymphadenopathy or thyromegaly appreciated. Cor: PMI nondisplaced. Regular rate & rhythm. 2/6 MR  Lungs: clear Abdomen: soft, nontender, nondistended. No hepatosplenomegaly. No bruits or masses. Good bowel sounds. Extremities: no cyanosis, clubbing, rash, edema Neuro: alert & orientedx3, cranial nerves grossly intact. moves all 4 extremities w/o difficulty. Affect pleasant   Telemetry   ? Junctional rhythm 60s, brief NSVT 3-4 beats    EKG  NSR 67 bpm   Labs   Basic Metabolic Panel: Recent Labs  Lab 01/10/23 1046 01/10/23 1644 01/10/23 1939 01/10/23 1942 01/10/23 2145 01/11/23 0149  NA 141  --  144  144 144 141 140  K 3.8  --  3.6  3.6 3.5 3.6 3.9  CL 106  --   --   --  103 103  CO2 22  --   --   --  25 21*  GLUCOSE 244*  --   --   --  173* 164*  BUN 17  --   --   --  18 20  CREATININE 1.35* 1.47*  --   --  1.38* 1.38*  CALCIUM 9.1  --   --   --  9.0 8.5*  MG  --   --   --   --  1.7  --     Liver Function Tests: Recent Labs  Lab 01/10/23 1046 01/10/23 2145  AST 18 209*  ALT 20 37  ALKPHOS 50 49  BILITOT 1.1 1.1  PROT 6.3* 6.3*  ALBUMIN 3.5 3.5   No results for input(s): "LIPASE", "AMYLASE" in the last 168 hours. No results for input(s): "AMMONIA" in the last 168 hours.  CBC: Recent Labs  Lab 01/10/23 1046 01/10/23 1644 01/10/23 1939 01/10/23 1942 01/10/23 2145 01/11/23 0149  WBC 6.0 6.6  --   --  7.6 8.3  NEUTROABS 4.1  --   --   --   --   --   HGB 13.2 13.6 13.9  13.9 13.6 12.3 10.7*  HCT 40.6 42.9 41.0  41.0 40.0 38.7 32.8*  MCV 90.4 91.5  --   --  92.1 89.4  PLT 310 310  --   --  258 249    Cardiac Enzymes: No results for input(s): "CKTOTAL", "CKMB",  "CKMBINDEX", "TROPONINI" in the last 168 hours.  BNP: BNP (last 3 results) Recent Labs    06/17/22 1000 08/31/22 1613 01/10/23 1048  BNP 1,397.5* 1,178.5* >4,500.0*    ProBNP (last 3 results) Recent Labs    06/09/22 1506  PROBNP 4,638*     CBG: Recent Labs  Lab 01/10/23 2227 01/11/23 0724  GLUCAP 176* 102*    Coagulation Studies: Recent Labs    01/10/23 2145  LABPROT 17.2*  INR 1.4*     Imaging   DG CHEST PORT 1 VIEW  Result Date: 01/10/2023 CLINICAL DATA:  Check central line placement EXAM: PORTABLE CHEST 1 VIEW COMPARISON:  Film from earlier in the same day. FINDINGS: Cardiac shadow is stable. Aortic calcifications are seen. Swan-Ganz catheter is noted extending into the pulmonary outflow tract. Increased density is noted over the right lung which may represent a posteriorly layering effusion or diffuse edema. Left lung is clear. No bony abnormality is noted. IMPRESSION: Swan-Ganz catheter as described. Increased density in the right hemithorax new from the prior exam. This may represent asymmetrical edema. Posterior fusion could cause this appearance as well. Electronically Signed   By: Inez Catalina M.D.   On: 01/10/2023 22:16   CARDIAC CATHETERIZATION  Result Date: 01/10/2023   Prox Cx to Mid Cx lesion is 40% stenosed.   Prox RCA lesion is 30% stenosed.   Prox RCA to Mid RCA lesion is 30% stenosed.   Mid RCA lesion is 40% stenosed.   Dist Cx lesion is 100% stenosed.   3rd Mrg lesion is 100% stenosed.   Prox LAD lesion is 50% stenosed.   1st Diag lesion is 50%  stenosed.   Mid LAD lesion is 20% stenosed.   Dist LAD lesion is 20% stenosed.   4th Mrg-1 lesion is 60% stenosed.   4th Mrg-2 lesion is 60% stenosed.   A drug-eluting stent was successfully placed.   Post intervention, there is a 0% residual stenosis.   Hemodynamic findings consistent with moderate pulmonary hypertension. Acute coronary syndrome/inferolateral STEMI secondary to distal occlusion of the circumflex  marginal vessel. Concomitant CAD with 50% proximal LAD stenosis, 50% stenosis in the first diagonal branch, 20% mid stenoses. Mild 30 to 40% stenoses in the RCA. Left circumflex vessel with total occlusion and TIMI 0 flow to the distal marginal vessel supplying the inferolateral apex.  There also is total occlusion of a small distal branch and diffuse segmental 60% stenosis in a medial branch. Successful PCI to the distal circumflex with ultimate insertion of a 2.25 x 22 mm Onyx frontier stent dilated with stent taper from 2.27-2.2 distally. Insertion of intra-aortic balloon pump the right femoral artery in this patient with severe LV dysfunction and acute mitral regurgitation probably contributed by papillary muscle dysfunction.. Swan-Ganz catheterization demonstrating severe pulmonary hypertension with PA pressure 74/35, mean 48. RECOMMENDATION: Patient received DAPT with aspirin/Brilinta.  Will need close observation of hemodynamic status with balloon pump.  CCM consult.  Patient apparently is statin intolerant and and for future PCSK9 inhibition.   ECHOCARDIOGRAM LIMITED  Result Date: 01/10/2023    ECHOCARDIOGRAM REPORT   Patient Name:   WINTER JOCELYN Date of Exam: 01/10/2023 Medical Rec #:  [id number]         Height:       62.0 in Accession #:    [id number]        Weight:       159.1 lb Date of Birth:  May 26, 1941         BSA:          1.735 m Patient Age:    17 years          BP:           153/96 mmHg Patient Gender: F                 HR:           72 bpm. Exam Location:  Inpatient Procedure: Limited Echo and Color Doppler                       STAT ECHO Reported to: Dr Eleonore Chiquito on 01/10/2023 5:20:00 PM. Indications:    Chest pain  History:        Patient has prior history of Echocardiogram examinations, most                 recent 01/28/2022. Signs/Symptoms:Shortness of Breath; Risk                 Factors:Diabetes, Hypertension, Dyslipidemia and Former Smoker.                 DOE. Dilated  cardiomyopathy. CKD.  Sonographer:    Clayton Lefort RDCS (AE) Referring Phys: [id number] Lily Kocher  Sonographer Comments: Dr. Lyman Bishop bedside for duration of echo before patient transferred to cath lab. IMPRESSIONS  1. Akinesis of the apical to mid lateral wall and apex. All other segments are hypokinetic. Left ventricular ejection fraction, by estimation, is 20 to 25%. Left ventricular ejection fraction by 2D MOD biplane is 24.0 %. The left ventricle has severely decreased function. The left ventricle  demonstrates regional wall motion abnormalities (see scoring diagram/findings for description). The left ventricular internal cavity size was mildly dilated. Left ventricular diastolic function could not be evaluated.  2. Right ventricular systolic function is mildly reduced. The right ventricular size is mildly enlarged.  3. Left atrial size was mild to moderately dilated.  4. Severe eccentric posteriorly directed mitral regurgitation. Given lateral WMA, suspect this is ischemic MR (IIIB) due to restricted PMVL. The mitral valve is grossly normal. Severe mitral valve regurgitation. No evidence of mitral stenosis.  5. The aortic valve is grossly normal. Aortic valve regurgitation is not visualized.  6. The inferior vena cava is normal in size with <50% respiratory variability, suggesting right atrial pressure of 8 mmHg. Comparison(s): Prior images unable to be directly viewed, comparison made by report only. Changes from prior study are noted. The left ventricular function is significantly worse. Severe MR now present. Conclusion(s)/Recommendation(s): Findings consistent with ischemic cardiomyopathy. FINDINGS  Left Ventricle: Akinesis of the apical to mid lateral wall and apex. All other segments are hypokinetic. Left ventricular ejection fraction, by estimation, is 20 to 25%. Left ventricular ejection fraction by 2D MOD biplane is 24.0 %. The left ventricle has severely decreased function. The left ventricle  demonstrates regional wall motion abnormalities. The left ventricular internal cavity size was mildly dilated. There is no left ventricular hypertrophy. Left ventricular diastolic function could not be evaluated.  LV Wall Scoring: The mid and distal lateral wall, mid anterolateral segment, and apex are akinetic. Right Ventricle: The right ventricular size is mildly enlarged. No increase in right ventricular wall thickness. Right ventricular systolic function is mildly reduced. Left Atrium: Left atrial size was mild to moderately dilated. Right Atrium: Right atrial size was normal in size. Pericardium: Trivial pericardial effusion is present. Mitral Valve: Severe eccentric posteriorly directed mitral regurgitation. Given lateral WMA, suspect this is ischemic MR (IIIB) due to restricted PMVL. The mitral valve is grossly normal. Severe mitral valve regurgitation. No evidence of mitral valve stenosis. Tricuspid Valve: The tricuspid valve is grossly normal. Tricuspid valve regurgitation is mild . No evidence of tricuspid stenosis. Aortic Valve: The aortic valve is grossly normal. Aortic valve regurgitation is not visualized. Pulmonic Valve: The pulmonic valve was not assessed. Aorta: The aortic root is normal in size and structure. Venous: The inferior vena cava is normal in size with less than 50% respiratory variability, suggesting right atrial pressure of 8 mmHg. IAS/Shunts: The atrial septum is grossly normal.  LEFT VENTRICLE PLAX 2D                        Biplane EF (MOD) LVIDd:         5.90 cm         LV Biplane EF:   Left LVIDs:         5.10 cm                          ventricular LV PW:         1.10 cm                          ejection LV IVS:        1.00 cm                          fraction by LVOT diam:     1.90 cm  2D MOD LVOT Area:     2.84 cm                         biplane is                                                 24.0 %.  LV Volumes (MOD) LV vol d, MOD    108.0 ml A2C: LV  vol d, MOD    136.0 ml A4C: LV vol s, MOD    80.5 ml A2C: LV vol s, MOD    106.0 ml A4C: LV SV MOD A2C:   27.5 ml LV SV MOD A4C:   136.0 ml LV SV MOD BP:    29.1 ml IVC IVC diam: 2.00 cm LEFT ATRIUM         Index LA diam:    5.10 cm 2.94 cm/m   AORTA Ao Root diam: 2.60 cm  SHUNTS Systemic Diam: 1.90 cm Eleonore Chiquito MD Electronically signed by Eleonore Chiquito MD Signature Date/Time: 01/10/2023/5:32:52 PM    Final    DG Chest 2 View  Result Date: 01/10/2023 CLINICAL DATA:  Chest pain.  Shortness of breath. EXAM: CHEST - 2 VIEW COMPARISON:  06/10/2022 FINDINGS: Stable cardiac enlargement. Aortic atherosclerosis. Blunting of the costophrenic angles noted with mild thickening along the fissures. Pulmonary vascular congestion. Atelectasis noted within the right lung base. IMPRESSION: 1. Cardiac enlargement and pulmonary vascular congestion. 2. Suspect small pleural effusions. 3.  Aortic Atherosclerosis (ICD10-I70.0). Electronically Signed   By: Kerby Moors M.D.   On: 01/10/2023 11:08     Medications:     Current Medications:  amiodarone  400 mg Oral Daily   aspirin  81 mg Oral Pre-Cath   aspirin  81 mg Oral Daily   carvedilol  25 mg Oral BID   Chlorhexidine Gluconate Cloth  6 each Topical Daily   empagliflozin  10 mg Oral Daily   furosemide  40 mg Intravenous BID   insulin aspart  0-15 Units Subcutaneous TID WC    morphine injection  2 mg Intravenous Once   phenylephrine       rosuvastatin  20 mg Oral QHS   sacubitril-valsartan  1 tablet Oral BID   sodium chloride flush  3 mL Intravenous Q12H   ticagrelor  90 mg Oral BID    Infusions:  sodium chloride     sodium chloride     nitroGLYCERIN Stopped (01/11/23 0100)   norepinephrine (LEVOPHED) Adult infusion Stopped (01/11/23 0214)      Patient Profile   82 y/o female w/ prior h/o nonischemic CM that was PVC mediated, EF recovered w/ suppression of PVCs. Now admitted for acute lateral wall MI 2/2 distal LCx occlusion c/b severe ischemic  MR and acute systolic heart failure w/ low output, CI 2.1 on RHC. Required placement of IABP, now removed given concerns for developing limb ischemia.    Assessment/Plan   1. CAD, S/p Acute Lateral Wall MI  - LHC w/ occluded dLCX, s/p PCI + DES - diffuse mild-mod nonobstructive disease in LAD and RCA - DAPT w/ ASA + Brilinta  - high intensity statin (Crestor increased to 20)  - stopping ? blocker for now w/ low output   2. Acute Systolic Heart Failure - EF in 2017 20-25%, NICM, cath w/ nonob dz,  felt PVC mediated. EF recovered w/ PVC suppression. EF 60-65% on echo 2021 - EF now 20-25% + lateral WMA, ICM 2/2 lateral wall MI/ occluded LCx now s/p PCI  - RHC w/ mod PH in setting of severe ischemic MR. CO low w/ CI 2.1. Initially w/ IABP, now removed given concerns for limb ischemia  - Lactic acid this morning elevated at 2.2  - needs central access for CVP and co-oximetry monitoring. Will place PICC line   - needs further diuresis and afterload reduction. If Co-ox significantly low may need milrinone. Otherwise, if marginal can push GDMT  - trend lactate for clearance   3. Acute Ischemic MR - 2/2 lateral wall MI  - TTE shows severe eccentric posteriorly directed mitral regurgitation w/ restricted PMVL - s/p LCx PCI  - after load reduction per above  - plan repeat echo to see if any improvement post revascularization of Cx  - don't think she would be a great operable candidate given age and CKD III   4. Possible Acute Limb Ischemia, Rt Leg - complication of IABP, developed s/o limb ischemia with cold foot/shin and absent pulses overnight   - IABP now removed - symptoms now resolved but pulses remain weak - obtain arterial dopplers now. If critical ischemia will consult VVS   5. Stage IIIb CKD  - b/l SCr ~1.3  - Scr 1.44 today  - place PICC to check co-ox, support output w/ inotropes if needed  - follow BMP closely   Length of Stay: 0  Lyda Jester, PA-C  01/11/2023, 9:21  AM  Advanced Heart Failure Team Pager [phone] (M-F; 7a - 5p)  Please contact [facility] for night-coverage after hours (4p -7a ) and weekends on [email or web address]  Patient seen with PA, agree with the above note .  History as noted above.  She had a nonischemic cardiomyopathy thought to be due to frequent PVCs that recovered, echo in 2/23 with EF 50-55%.  Patient reports shortness of breath with exertion for at least 2 months, but she developed chest pain acutely on 1/30.  She was admitted with acute lateral MI and had PCI to a large PLOM branch.  Cardiogenic shock, IABP placed but later removed due to concern for leg ischemia.  Echo showed EF 20-25%, mild LV dilation, mild RV dysfunction, severe MR with restricted posterior leaflet.   Lactate has trended higher today, 2.2 => 4.1.  BP is stable.  She is currently getting a right groin pseudoaneurysm injected with thrombin.    General: NAD Neck: JVP 8-9 cm, no thyromegaly or thyroid nodule.  Lungs: Clear to auscultation bilaterally with normal respiratory effort. CV: Nondisplaced PMI.  Heart regular S1/S2, no S3/S4, 2/6 HSM apex.  No peripheral edema.   Abdomen: Soft, nontender, no hepatosplenomegaly, no distention.  Skin: Intact without lesions or rashes.  Neurologic: Alert and oriented x 3.  Psych: Normal affect. Extremities: No clubbing or cyanosis.  HEENT: Normal.   1. Cardiogenic shock/acute on chronic systolic CHF: Echo this admission with EF 20-25%, mild LV dilation, mild RV dysfunction, severe MR with restricted posterior leaflet.  Echo in 2/23 with EF 50-55%.  Patient has history of nonischemic cardiomyopathy (?PVC-mediated) that improved.  She reports 2 months of dyspnea then had lateral STEMI yesterday with chest pain.  I do not think that her PLOM occlusion can explain the extent of her cardiomyopathy, so suspect EF may have been falling prior to this event (symptomatic at least 2 months).  Suspect mixed ischemic/nonischemic  cardiomyopathy.  Infarct-related MR also plays a role (see below).  She initially had IABP for cardiogenic shock, now removed due to right leg ischemia.  Lactate higher today at 4.1.  BP stable.  - I will start milrinone 0.25 mcg/kg/min.  - Add digoxin.  - Follow CVP/co-ox via PICC.  - Can continue Jardiance 10 mg daily.  - Lasix 40 mg IV bid.  2. Mitral regurgitation: I reviewed echo from this admission, there is severe MR with restricted posterior leaflet, suspect infarct-related MR with PLOM occlusion.  - Will treat for now with milrinone and diuresis.  - May need to consider Mitraclip in future.  3. CAD: Lateral STEMI with occluded pLOM, treated with DES.   - Continue ASA 81 - Continue ticagrelor - Continue statin.  4. 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  - Continue amiodarone, has been on chronically.  Can decrease dose to 200 mg daily.  5. Right femoral PSA: Now s/p thrombin injection by Dr. Carlis Abbott.  6. CKD stage 3: Creatinine stable 1.44 today, follow closely.   Loralie Champagne 01/11/2023 3:16 PM

## 2023-01-11 NOTE — Progress Notes (Signed)
Called by RN for frequent ectopy. Pt has a history of PVCs on amiodarone suppression. Currently on 400 mg amiodarone. Has received IV magnesium today, repeat Mg level pending.  iStat K was 3.7. sCr 1.44, will give another 40 mEq.  I will order an additional 2g Mg IV. Pt currently on BiPAP and resting comfortably. Also started on milrinone today. Will hold off on amiodarone bolus for now. Short runs of NSVT and frequent couplets/triplets on telemetry, none while I was in the room.

## 2023-01-11 NOTE — Consult Note (Addendum)
Hospital Consult    Reason for Consult: Right femoral pseudoaneurysm after removal of intra-aortic balloon pump Referring Physician: Cardiology MRN #:  [id number]  History of Present Illness: This is a 82 y.o. female with history of coronary artery disease status post MI, acute systolic heart failure with EF 20 to 25%, stage III CKD that vascular surgery has been consulted for right femoral pseudoaneurysm.  She presented to the ED yesterday with worsening shortness of breath and was found to have STEMI.  She went to the Cath Lab with cardiology and underwent PCI including insertion of intra-aortic balloon pump from the right femoral artery approach.  Last night she was noted to have oozing and hematoma formation around the sheath and this was removed and manual pressure was held.  Duplex today shows right groin hematoma with residual pseudoaneurysm.  Patient has no complaints of right lower extremity foot pain.  She only complains of pain in the right groin when he touches it.  Previous vascular studies from 2022 showed occluded right SFA with monophasic runoff at the ankle.  Her ABI was 0.5 in 2022.  Past Medical History:  Diagnosis Date   Calcific tendonitis 2008   Treatment of left leg   Cardiomyopathy ([place]) 06/2017   a) Echo: EF 25-30%. GR 1-2 DD w/ elevated LVEDP. Mild valvular Dz; b) Cardiac MRI 2/'19: frequent PVCs (diffiuclt to interpret) - EF ~27% w/ diffuse HK.  No evidence of infarct, infiltrative Dz or myocarditis. -- ? if related to PVCs. c) f/u Echo 6/'19: EF 35-40%. Gr 1 DD. Diffuse HK.-> d) Jan 2020 EF 50 to 55%.  Mod MAC, mod LAE.  Ao Sclerosis; e) Echo 1/'21 - EF 60-65%, Gr II DD. Mild Mod MR.    Chronic combined systolic and diastolic CHF, NYHA class 2 and ACA/AHA stage C    Now essentially resolved, back to simply diastolic CHF   Coronary artery disease, non-occlusive    mild-moderate CAD 06/30/17 cath   CTS (carpal tunnel syndrome)    Diabetes mellitus    Type II    Dysrhythmia    patient said that she cant remember what it is   Frequent unifocal PVCs 01/2018   Event Monitor: NSR, W/ frequent multifocal PVCs (9%-down from 30% prior to amiodarone) and PACs.  Nighttime bradycardia suggestive of OSA.';  Zio patch September 2023: PVC burden now 6.1%.   Hiatal hernia    with reflux   Hyperlipidemia    Statin intolerant   Hypertension    Ichthyosis congenita    Intermittent claudication of both lower extremities due to atherosclerosis ([place]) 08/22/2021   LEA Dopplers 09/09/2021:  Right: Total occlusion noted in the superficial femoral artery. Atherosclerosis noted throughout extremity, see note above. Three vessel runoff.  Left: Total occlusion noted in the superficial femoral artery and/or popliteal artery. Total occlusion noted in the distal anterior tibial artery. Athereosclerosis noted throughout extremity.    NSVD (normal spontaneous vaginal delivery) 1971 & 1972   Obesity    Plantar fasciitis    Left   Pulmonary nodule    Imaged multiple times and benign appearing   Wears glasses     Past Surgical History:  Procedure Laterality Date   ABDOMINAL HYSTERECTOMY  1975   TAH-BSO   ABSCESS DRAINAGE  1972   right breast   BREAST BIOPSY Left 01/14/2009   Stereo- Benign   CARDIAC MRI  01/2018   Difficult to interpret 2/2 PVCs. Normal LV size - EF ~27% with diffuse HK. Mild RV  dilation - normal fxn. -- NO MYOCARDIAL LGI => no definitive evidence of prior MI, Infiltrative Dz or Myocarditis -- suspect NICM, possibly related to PVCs.   CARPAL TUNNEL RELEASE Right 07/02/2014   Procedure: RIGHT CARPAL TUNNEL RELEASE;  Surgeon: Wynonia Sours, MD;  Location: [facility];  Service: Orthopedics;  Laterality: Right;   CARPAL TUNNEL RELEASE Left 06/11/2015   Procedure: LEFT CARPAL TUNNEL RELEASE;  Surgeon: Daryll Brod, MD;  Location: [facility];  Service: Orthopedics;  Laterality: Left;  REGIONAL/FAB   COLONOSCOPY     corn removal  1968    right   CORONARY STENT INTERVENTION N/A 01/10/2023   Procedure: CORONARY STENT INTERVENTION;  Surgeon: Troy Sine, MD;  Location: [facility] [facility];  Service: Cardiovascular;  Laterality: N/A;   Holter Monitor  06/2017   ~17,000 PVC beats - majority were singlets, some couplets. 44 brief 3-7 beat runs of NSVT. Also noted were less frequent PACs with 11 runs (longest 15 beats)   IABP INSERTION Right 01/10/2023   Procedure: IABP Insertion;  Surgeon: Troy Sine, MD;  Location: [facility];  Service: Cardiovascular;  Laterality: Right;   OPEN REDUCTION INTERNAL FIXATION (ORIF) DISTAL RADIAL FRACTURE Left 07/21/2017   Procedure: OPEN REDUCTION INTERNAL FIXATION (ORIF) LEFT DISTAL RADIAL FRACTURE;  Surgeon: Renette Butters, MD;  Location: [facility];  Service: Orthopedics;  Laterality: Left;   RIGHT/LEFT HEART CATH AND CORONARY ANGIOGRAPHY N/A 06/30/2017   Procedure: Right/Left Heart Cath and Coronary Angiography;  Surgeon: Leonie Man, MD;  Location: [facility] INVASIVE CV LAB:  pRCA 55%, pCx 40%, OM1 45%, mCx 50%, D2 50% - LVEF 25-35%. Moderately elevated LVEDP(26 mmHg with PCWP 16 mmHg).  FICK CO/CI: 4.47/2.48. PA pressures 47/14 mmHg with a mean of 27 mm.   RIGHT/LEFT HEART CATH AND CORONARY ANGIOGRAPHY N/A 01/10/2023   Procedure: RIGHT/LEFT HEART CATH AND CORONARY ANGIOGRAPHY;  Surgeon: Troy Sine, MD;  Location: [place] [facility];  Service: Cardiovascular;  Laterality: N/A;   ROTATOR CUFF REPAIR     left shoulder   TRANSTHORACIC ECHOCARDIOGRAM  06/2017   EF 25 and 30%. GR 1 DD. Mild diastolic dysfunction with elevated LVEDP. Mild valvular disease.   TRANSTHORACIC ECHOCARDIOGRAM  11'18, 6/'19    a) EF remains 30-35%.  Diffuse hypokinesis noted.  Severe LA dilation; b) Improved EF 35-40%.  Diffuse HK.  GR 1 DD.  Moderate TR.  Mild RV dilation.   TRANSTHORACIC ECHOCARDIOGRAM  01/28/2022   Follow-up echo: EF 50 to 55%.  No RWMA.  GRII DD.  Severe LA dilation.  Mild RA dilation with  normal PAP and RAP.  Mild to moderate TR (no significant change noted)   Zio patch  08/2022   3-day: Currently NSR (HR range 47-91 with average 64 bpm) 1 F AVB with IVCD/BBB.  Rare isolated PACs (<1%).  Frequent PVCs (6.1% with rare couplets and triplets.  Also bigeminy.  2 atrial runs 8 beats 5 2 seconds.  No sustained arrhythmias.   ZIO Patch 14 d Event Monitor  10/2018   Results as below < 1% PVC.  (Notably improved after starting amiodarone)    Allergies  Allergen Reactions   Ezetimibe-Simvastatin Other (See Comments)    REACTION: Muscle aches (side effect)   Lipitor [Atorvastatin] Other (See Comments)    Leg weakness    Claritin [Loratadine]     Possible cause of nightmares.     Spironolactone     Elevated potassium/creatinine  Tamiflu [Oseltamivir Phosphate] Other (See Comments)    nightmares   Pravastatin Sodium Other (See Comments)    REACTION: Muscle aches (side effect)   Sulfonamide Derivatives Nausea And Vomiting    Prior to Admission medications   Medication Sig Start Date End Date Taking? Authorizing Provider  acetaminophen (TYLENOL) 500 MG tablet Take 500 mg by mouth every 6 (six) hours as needed.    [provider]  amiodarone (PACERONE) 200 MG tablet TAKE ONE-HALF TABLET BY MOUTH EVERY TUES/THURS/SAT Patient taking differently: Take 100 mg by mouth See admin instructions. TAKE ONE-HALF TABLET BY MOUTH EVERY TUES/THURS/SAT 11/10/22   Tonia Ghent, MD  aspirin 81 MG tablet Take 1 tablet (81 mg total) by mouth daily. 09/14/17   Tonia Ghent, MD  carvedilol (COREG) 25 MG tablet TAKE 1 TABLET BY MOUTH TWICE DAILY 11/10/22   Tonia Ghent, MD  Cholecalciferol (VITAMIN D) 1000 UNITS capsule Take 1,000 Units by mouth daily.    [provider]  doxycycline (VIBRA-TABS) 100 MG tablet Take 1 tablet (100 mg total) by mouth 2 (two) times daily. 11/23/22   Copland, Frederico Hamman, MD  Fluticasone-Umeclidin-Vilant (TRELEGY ELLIPTA) 100-62.5-25 MCG/ACT AEPB  Inhale 1 puff into the lungs daily. Patient not taking: Reported on 12/02/2022 10/10/22   Deneise Lever, MD  furosemide (LASIX) 20 MG tablet Take 1 tablet (20 mg total) by mouth daily. 06/17/22   Loel Dubonnet, NP  glucose blood (ACCU-CHEK AVIVA PLUS) test strip USE TO TEST BLOOD SUGAR ONCE DAILY FOR DM E11.9 09/27/21   Tonia Ghent, MD  Ipratropium-Albuterol (COMBIVENT RESPIMAT) 20-100 MCG/ACT AERS respimat Inhale 1 puff into the lungs every 6 (six) hours as needed for wheezing or shortness of breath. 01/04/23   Ria Bush, MD  JARDIANCE 10 MG TABS tablet TAKE 1/2 TABLET(5 MG) BY MOUTH DAILY 08/12/22   Leonie Man, MD  Lancets (ACCU-CHEK MULTICLIX) lancets USE ONE LANCET TO TEST BLOOD GLUCOSE DAILY FOR DM 250.00 05/25/22   Tonia Ghent, MD  metFORMIN (GLUCOPHAGE) 500 MG tablet TAKE 1/2 TABLET BY MOUTH DAILY WITH A MEAL 09/27/22   Tonia Ghent, MD  rosuvastatin (CRESTOR) 10 MG tablet TAKE 1 TABLET(10 MG) BY MOUTH DAILY Patient taking differently: Take 10 mg by mouth daily. 10/18/22   Tonia Ghent, MD  sacubitril-valsartan (ENTRESTO) 97-103 MG Take 1 tablet by mouth 2 (two) times daily. 10/24/22   Leonie Man, MD  traMADol (ULTRAM) 50 MG tablet TAKE 1 TABLET(50 MG) BY MOUTH THREE TIMES DAILY AS NEEDED 11/16/22   Tonia Ghent, MD    Social History   Socioeconomic History   Marital status: Married    Spouse name: Not on file   Number of children: 2   Years of education: Not on file   Highest education level: Not on file  Occupational History    Employer: RETIRED  Tobacco Use   Smoking status: Former    Years: 28.00    Types: Cigarettes    Quit date: 12/12/1993    Years since quitting: 29.1   Smokeless tobacco: Never  Vaping Use   Vaping Use: Never used  Substance and Sexual Activity   Alcohol use: No    Alcohol/week: 0.0 standard drinks of alcohol   Drug use: No   Sexual activity: Never  Other Topics Concern   Not on file  Social History  Narrative   Two kids, two grandchildren, two great grandchildren.    Married 1969   Retired.  Social Determinants of Health   Financial Resource Strain: Low Risk  (05/12/2022)   Overall Financial Resource Strain (CARDIA)    Difficulty of Paying Living Expenses: Not hard at all  Food Insecurity: No Food Insecurity (05/12/2022)   Hunger Vital Sign    Worried About Running Out of Food in the Last Year: Never true    Ran Out of Food in the Last Year: Never true  Transportation Needs: No Transportation Needs (05/12/2022)   PRAPARE - Hydrologist (Medical): No    Lack of Transportation (Non-Medical): No  Physical Activity: Sufficiently Active (05/12/2022)   Exercise Vital Sign    Days of Exercise per Week: 6 days    Minutes of Exercise per Session: 60 min  Stress: No Stress Concern Present (05/12/2022)   [place]    Feeling of Stress : Not at all  Social Connections: Not on file  Intimate Partner Violence: Not At Risk (11/12/2019)   Humiliation, Afraid, Rape, and Kick questionnaire    Fear of Current or Ex-Partner: No    Emotionally Abused: No    Physically Abused: No    Sexually Abused: No     Family History  Problem Relation Age of Onset   Heart disease Mother        s/p pacemaker   Diabetes Mother    Heart disease Father    Diabetes Father    Heart disease Brother        MI   Cancer Brother        Lung   Hypertension Other    Colon cancer Neg Hx    Breast cancer Neg Hx     ROS: [x]  Positive   [ ]  Negative   [ ]  All sytems reviewed and are negative  Cardiovascular: []  chest pain/pressure []  palpitations []  SOB lying flat []  DOE []  pain in legs while walking []  pain in legs at rest []  pain in legs at night []  non-healing ulcers []  hx of DVT []  swelling in legs  Pulmonary: []  productive cough []  asthma/wheezing []  home O2  Neurologic: []  weakness in []  arms []   legs []  numbness in []  arms []  legs []  hx of CVA []  mini stroke [] difficulty speaking or slurred speech []  temporary loss of vision in one eye []  dizziness  Hematologic: []  hx of cancer []  bleeding problems []  problems with blood clotting easily  Endocrine:   []  diabetes []  thyroid disease  GI []  vomiting blood []  blood in stool  GU: []  CKD/renal failure []  HD--[]  M/W/F or []  T/T/S []  burning with urination []  blood in urine  Psychiatric: []  anxiety []  depression  Musculoskeletal: []  arthritis []  joint pain  Integumentary: []  rashes []  ulcers  Constitutional: []  fever []  chills   Physical Examination  Vitals:   01/11/23 0800 01/11/23 1105  BP: 137/69   Pulse: 63   Resp: 19   Temp:  98.1 F (36.7 C)  SpO2: 100%    Body mass index is 27.18 kg/m.  General:  NAD Gait: Not observed HENT: WNL, normocephalic Pulmonary: increased WOB at baseline Cardiac: regular, without  Murmurs, rubs or gallops Abdomen:  soft, NT/ND, no masses Vascular Exam/Pulses: Pulsatile mass right groin with hematoma, skin intact Right PT monophasic Extremities: without ischemic changes Musculoskeletal: no muscle wasting or atrophy  Neurologic: A&O X 3; Appropriate Affect ; SENSATION: normal; MOTOR FUNCTION:  moving all extremities equally. Speech is fluent/normal  CBC  Component Value Date/Time   WBC 12.5 (H) 01/11/2023 0850   RBC 3.55 (L) 01/11/2023 0850   HGB 10.3 (L) 01/11/2023 0850   HGB 14.2 10/09/2020 0935   HCT 32.5 (L) 01/11/2023 0850   HCT 44.9 10/09/2020 0935   PLT 234 01/11/2023 0850   PLT 379 10/09/2020 0935   MCV 91.5 01/11/2023 0850   MCV 91 10/09/2020 0935   MCH 29.0 01/11/2023 0850   MCHC 31.7 01/11/2023 0850   RDW 14.9 01/11/2023 0850   RDW 13.4 10/09/2020 0935   LYMPHSABS 1.1 01/10/2023 1046   LYMPHSABS 1.1 10/09/2020 0935   MONOABS 0.5 01/10/2023 1046   EOSABS 0.2 01/10/2023 1046   EOSABS 0.2 10/09/2020 0935   BASOSABS 0.1 01/10/2023 1046    BASOSABS 0.1 10/09/2020 0935    BMET    Component Value Date/Time   NA 141 01/11/2023 0850   NA 145 (H) 07/07/2022 1007   K 4.7 01/11/2023 0850   CL 102 01/11/2023 0850   CO2 23 01/11/2023 0850   GLUCOSE 105 (H) 01/11/2023 0850   BUN 17 01/11/2023 0850   BUN 24 07/07/2022 1007   CREATININE 1.44 (H) 01/11/2023 0850   CREATININE 1.28 (H) 01/14/2022 1551   CALCIUM 8.8 (L) 01/11/2023 0850   GFRNONAA 36 (L) 01/11/2023 0850   GFRAA 49 (L) 10/09/2020 0933    COAGS: Lab Results  Component Value Date   INR 1.4 (H) 01/10/2023   INR 1.0 06/27/2017     Non-Invasive Vascular Imaging:     Pseudoaneurysm of the right common femoral artery with long neck measuring approximately 1.5x0.5 cm.  The pseudoaneurysm cavity itself measures 4.4 x 2.2 cm.  ASSESSMENT/PLAN: This is a 82 y.o. female with history of coronary artery disease status post MI, acute systolic heart failure with EF 20 to 25%, stage III CKD that vascular surgery has been consulted for right femoral pseudoaneurysm.  She presented to the ED yesterday with worsening shortness of breath and was found to have STEMI.  She went to the Cath Lab with cardiology and underwent PCI including insertion of intra-aortic balloon pump from the right femoral artery approach.   I have reviewed her duplex imaging and she has a fairly long neck to the pseudoaneurysm that would make this amendable to thrombin injection.  Her hemodynamics are stable and skin intact which also makes injection an option.  I discussed risks and benefits including risk of thromboembolism with injection.  If this fails ultimately will need operative exploration in the OR but trying to avoid taking her to the OR if we can.  She has a PT monophasic signal at the foot and denies any associated foot pain including pain or numbness and foot perfusion appears at baseline.  Has known chronic PAD.     Marty Heck, MD [facility] Office:  [place]

## 2023-01-11 NOTE — Progress Notes (Signed)
DAILY PROGRESS NOTE   Patient Name: Natasha Woodard Date of Encounter: 01/11/2023 Cardiologist: None  Chief Complaint   No chest pain  Patient Profile   Natasha Woodard is a 82 y.o. female with chronic HFpEF, dilated cardiomyopathy, frequent PVCs, hyperlipidemia, DM type II, intermittent claudication of bilateral lower extremities, CKD, OSA who is being seen 01/10/2023 for the evaluation of chest pain, elevated troponin.   Subjective   No chest pain overnight- found to have some NSVT on the monitor. Seen by the overnight fellow and noted to have an expanding hematoma around the IABP access site. Given concern for bleeding and risk of limb ischemia, the IABP and PA catheters were removed overnight.  She had good urine output, about 1.1L net negative. Breathing is better today. Lactate was 2.0 yesterday - repeat ordered. Co-ox yesterday was 53.5%. Creatinine has remained stable at 1.38. Magnesium was 1.7 yesterday. Hemoglobin dropped to 10.7 from 12.3.  Objective   Vitals:   01/11/23 0715 01/11/23 0726 01/11/23 0730 01/11/23 0745  BP: 138/82  134/72 124/84  Pulse: 67  69 64  Resp: (!) 32  (!) 26 (!) 30  Temp:  99.5 F (37.5 C)    TempSrc:  Oral    SpO2: 98%  93% 99%  Weight:        Intake/Output Summary (Last 24 hours) at 01/11/2023 0086 Last data filed at 01/11/2023 0700 Gross per 24 hour  Intake 841.14 ml  Output 1975 ml  Net -1133.86 ml   Filed Weights   01/11/23 0409  Weight: 67.4 kg    Physical Exam   General appearance: alert and no distress Neck: JVD - several cm above sternal notch, no carotid bruit, and thyroid not enlarged, symmetric, no tenderness/mass/nodules Lungs: diminished breath sounds bibasilar Heart: regular rate and rhythm Abdomen: soft, non-tender; bowel sounds normal; no masses,  no organomegaly and obese Extremities: cool bilaterally, decreased pulses, no DP on the right, there is a small right groin hematoma at cath site, some oozing on  the bandage Pulses: weak distal pulses Skin: Skin color, texture, turgor normal. No rashes or lesions Neurologic: Grossly normal Psych: Pleasant, HOH  Inpatient Medications    Scheduled Meds:  [START ON 01/12/2023] amiodarone  100 mg Oral Once per day on Tue Thu Sat   aspirin  81 mg Oral Pre-Cath   aspirin  81 mg Oral Daily   carvedilol  25 mg Oral BID   Chlorhexidine Gluconate Cloth  6 each Topical Daily   empagliflozin  10 mg Oral Daily   insulin aspart  0-15 Units Subcutaneous TID WC    morphine injection  2 mg Intravenous Once   phenylephrine       rosuvastatin  20 mg Oral QHS   sacubitril-valsartan  1 tablet Oral BID   sodium chloride flush  3 mL Intravenous Q12H   ticagrelor  90 mg Oral BID    Continuous Infusions:  sodium chloride     sodium chloride Stopped (01/11/23 0619)   sodium chloride     heparin Stopped (01/10/23 2322)   nitroGLYCERIN Stopped (01/11/23 0100)   norepinephrine (LEVOPHED) Adult infusion Stopped (01/11/23 0214)    PRN Meds: sodium chloride, acetaminophen, hydrALAZINE, ipratropium-albuterol, labetalol, nitroGLYCERIN, ondansetron (ZOFRAN) IV, phenylephrine, sodium chloride flush   Labs   Results for orders placed or performed during the hospital encounter of 01/10/23 (from the past 48 hour(s))  Comprehensive metabolic panel     Status: Abnormal   Collection Time: 01/10/23 10:46 AM  Result Value Ref Range   Sodium 141 135 - 145 mmol/L   Potassium 3.8 3.5 - 5.1 mmol/L   Chloride 106 98 - 111 mmol/L   CO2 22 22 - 32 mmol/L   Glucose, Bld 244 (H) 70 - 99 mg/dL    Comment: Glucose reference range applies only to samples taken after fasting for at least 8 hours.   BUN 17 8 - 23 mg/dL   Creatinine, Ser 1.30 (H) 0.44 - 1.00 mg/dL   Calcium 9.1 8.9 - 86.5 mg/dL   Total Protein 6.3 (L) 6.5 - 8.1 g/dL   Albumin 3.5 3.5 - 5.0 g/dL   AST 18 15 - 41 U/L   ALT 20 0 - 44 U/L   Alkaline Phosphatase 50 38 - 126 U/L   Total Bilirubin 1.1 0.3 - 1.2 mg/dL    GFR, Estimated 39 (L) >60 mL/min    Comment: (NOTE) Calculated using the CKD-EPI Creatinine Equation (2021)    Anion gap 13 5 - 15    Comment: Performed at [facility], [address], [place], [place] [address]  CBC with Differential     Status: None   Collection Time: 01/10/23 10:46 AM  Result Value Ref Range   WBC 6.0 4.0 - 10.5 K/uL   RBC 4.49 3.87 - 5.11 MIL/uL   Hemoglobin 13.2 12.0 - 15.0 g/dL   HCT 62.9 52.8 - 41.3 %   MCV 90.4 80.0 - 100.0 fL   MCH 29.4 26.0 - 34.0 pg   MCHC 32.5 30.0 - 36.0 g/dL   RDW 24.4 01.0 - 27.2 %   Platelets 310 150 - 400 K/uL   nRBC 0.0 0.0 - 0.2 %   Neutrophils Relative % 68 %   Neutro Abs 4.1 1.7 - 7.7 K/uL   Lymphocytes Relative 19 %   Lymphs Abs 1.1 0.7 - 4.0 K/uL   Monocytes Relative 8 %   Monocytes Absolute 0.5 0.1 - 1.0 K/uL   Eosinophils Relative 3 %   Eosinophils Absolute 0.2 0.0 - 0.5 K/uL   Basophils Relative 1 %   Basophils Absolute 0.1 0.0 - 0.1 K/uL   Immature Granulocytes 1 %   Abs Immature Granulocytes 0.03 0.00 - 0.07 K/uL    Comment: Performed at [facility], [address], [place], [place] [address]  Troponin I (High Sensitivity)     Status: Abnormal   Collection Time: 01/10/23 10:46 AM  Result Value Ref Range   Troponin I (High Sensitivity) 32 (H) <18 ng/L    Comment: (NOTE) Elevated high sensitivity troponin I (hsTnI) values and significant  changes across serial measurements may suggest ACS but many other  chronic and acute conditions are known to elevate hsTnI results.  Refer to the "Links" section for chest pain algorithms and additional  guidance. Performed at [facility], [address] [place], [place] [address]   Brain natriuretic peptide     Status: Abnormal   Collection Time: 01/10/23 10:48 AM  Result Value Ref Range   B Natriuretic Peptide >4,500.0 (H) 0.0 - 100.0 pg/mL    Comment: Performed at [facility], [address] [place], [place] [address]  Troponin I (High  Sensitivity)     Status: Abnormal   Collection Time: 01/10/23  1:25 PM  Result Value Ref Range   Troponin I (High Sensitivity) 2,363 (HH) <18 ng/L    Comment: CRITICAL RESULT CALLED TO, READ BACK BY AND VERIFIED WITH K.BAIN,RN @1415  01/10/2023  VANG.J (NOTE) Elevated high sensitivity troponin I (hsTnI) values and significant  changes across serial measurements may suggest ACS but many other  chronic and acute conditions are known to elevate hsTnI results.  Refer to the "Links" section for chest pain algorithms and additional  guidance. Performed at [facility], [address] [place], [place] [address]   CBC     Status: None   Collection Time: 01/10/23  4:44 PM  Result Value Ref Range   WBC 6.6 4.0 - 10.5 K/uL   RBC 4.69 3.87 - 5.11 MIL/uL   Hemoglobin 13.6 12.0 - 15.0 g/dL   HCT 77.4 12.8 - 78.6 %   MCV 91.5 80.0 - 100.0 fL   MCH 29.0 26.0 - 34.0 pg   MCHC 31.7 30.0 - 36.0 g/dL   RDW 76.7 20.9 - 47.0 %   Platelets 310 150 - 400 K/uL   nRBC 0.0 0.0 - 0.2 %    Comment: Performed at [facility], [address], [place], [place] [address]  Creatinine, serum     Status: Abnormal   Collection Time: 01/10/23  4:44 PM  Result Value Ref Range   Creatinine, Ser 1.47 (H) 0.44 - 1.00 mg/dL   GFR, Estimated 35 (L) >60 mL/min    Comment: (NOTE) Calculated using the CKD-EPI Creatinine Equation (2021) Performed at [facility], [address] [place], [place] [address]   POCT Activated clotting time     Status: None   Collection Time: 01/10/23  6:13 PM  Result Value Ref Range   Activated Clotting Time 261 seconds    Comment: Reference range 74-137 seconds for patients not on anticoagulant therapy.  POCT Activated clotting time     Status: None   Collection Time: 01/10/23  6:32 PM  Result Value Ref Range   Activated Clotting Time 336 seconds    Comment: Reference range 74-137 seconds for patients not on anticoagulant therapy.  POCT Activated clotting time      Status: None   Collection Time: 01/10/23  6:59 PM  Result Value Ref Range   Activated Clotting Time 234 seconds    Comment: Reference range 74-137 seconds for patients not on anticoagulant therapy.  POCT Activated clotting time     Status: None   Collection Time: 01/10/23  7:24 PM  Result Value Ref Range   Activated Clotting Time 228 seconds    Comment: Reference range 74-137 seconds for patients not on anticoagulant therapy.  POCT I-Stat EG7     Status: None   Collection Time: 01/10/23  7:39 PM  Result Value Ref Range   pH, Ven 7.349 7.25 - 7.43   pCO2, Ven 48.6 44 - 60 mmHg   pO2, Ven 35 32 - 45 mmHg   Bicarbonate 26.8 20.0 - 28.0 mmol/L   TCO2 28 22 - 32 mmol/L   O2 Saturation 63 %   Acid-Base Excess 0.0 0.0 - 2.0 mmol/L   Sodium 144 135 - 145 mmol/L   Potassium 3.6 3.5 - 5.1 mmol/L   Calcium, Ion 1.23 1.15 - 1.40 mmol/L   HCT 41.0 36.0 - 46.0 %   Hemoglobin 13.9 12.0 - 15.0 g/dL   Sample type VENOUS    Comment NOTIFIED PHYSICIAN   POCT I-Stat EG7     Status: None   Collection Time: 01/10/23  7:39 PM  Result Value Ref Range   pH, Ven 7.349 7.25 - 7.43   pCO2, Ven 49.4 44 - 60 mmHg  pO2, Ven 34 32 - 45 mmHg   Bicarbonate 27.2 20.0 - 28.0 mmol/L   TCO2 29 22 - 32 mmol/L   O2 Saturation 61 %   Acid-Base Excess 1.0 0.0 - 2.0 mmol/L   Sodium 144 135 - 145 mmol/L   Potassium 3.6 3.5 - 5.1 mmol/L   Calcium, Ion 1.22 1.15 - 1.40 mmol/L   HCT 41.0 36.0 - 46.0 %   Hemoglobin 13.9 12.0 - 15.0 g/dL   Sample type VENOUS    Comment NOTIFIED PHYSICIAN   I-STAT 7, (LYTES, BLD GAS, ICA, H+H)     Status: None   Collection Time: 01/10/23  7:42 PM  Result Value Ref Range   pH, Arterial 7.361 7.35 - 7.45   pCO2 arterial 43.9 32 - 48 mmHg   pO2, Arterial 87 83 - 108 mmHg   Bicarbonate 24.9 20.0 - 28.0 mmol/L   TCO2 26 22 - 32 mmol/L   O2 Saturation 96 %   Acid-base deficit 1.0 0.0 - 2.0 mmol/L   Sodium 144 135 - 145 mmol/L   Potassium 3.5 3.5 - 5.1 mmol/L   Calcium, Ion 1.17  1.15 - 1.40 mmol/L   HCT 40.0 36.0 - 46.0 %   Hemoglobin 13.6 12.0 - 15.0 g/dL   Sample type ARTERIAL   POCT Activated clotting time     Status: None   Collection Time: 01/10/23  7:43 PM  Result Value Ref Range   Activated Clotting Time 271 seconds    Comment: Reference range 74-137 seconds for patients not on anticoagulant therapy.  POCT Activated clotting time     Status: None   Collection Time: 01/10/23  9:34 PM  Result Value Ref Range   Activated Clotting Time 277 seconds    Comment: Reference range 74-137 seconds for patients not on anticoagulant therapy.  CBC     Status: None   Collection Time: 01/10/23  9:45 PM  Result Value Ref Range   WBC 7.6 4.0 - 10.5 K/uL   RBC 4.20 3.87 - 5.11 MIL/uL   Hemoglobin 12.3 12.0 - 15.0 g/dL   HCT 16.1 09.6 - 04.5 %   MCV 92.1 80.0 - 100.0 fL   MCH 29.3 26.0 - 34.0 pg   MCHC 31.8 30.0 - 36.0 g/dL   RDW 40.9 81.1 - 91.4 %   Platelets 258 150 - 400 K/uL   nRBC 0.0 0.0 - 0.2 %    Comment: Performed at [facility], [address] [place], [place] [address]  Lactic acid, plasma     Status: Abnormal   Collection Time: 01/10/23  9:45 PM  Result Value Ref Range   Lactic Acid, Venous 2.0 (HH) 0.5 - 1.9 mmol/L    Comment: CRITICAL RESULT CALLED TO, READ BACK BY AND VERIFIED WITH T.CARVER,RN. 2231 01/10/23. LPAIT Performed at [facility], [address] [place], [place] [address]   Comprehensive metabolic panel     Status: Abnormal   Collection Time: 01/10/23  9:45 PM  Result Value Ref Range   Sodium 141 135 - 145 mmol/L   Potassium 3.6 3.5 - 5.1 mmol/L   Chloride 103 98 - 111 mmol/L   CO2 25 22 - 32 mmol/L   Glucose, Bld 173 (H) 70 - 99 mg/dL    Comment: Glucose reference range applies only to samples taken after fasting for at least 8 hours.   BUN 18 8 - 23 mg/dL   Creatinine, Ser 8.65 (H) 0.44 - 1.00 mg/dL  Calcium 9.0 8.9 - 10.3 mg/dL   Total Protein 6.3 (L) 6.5 - 8.1 g/dL   Albumin 3.5 3.5 - 5.0 g/dL   AST 209 (H) 15 -  41 U/L   ALT 37 0 - 44 U/L   Alkaline Phosphatase 49 38 - 126 U/L   Total Bilirubin 1.1 0.3 - 1.2 mg/dL   GFR, Estimated 38 (L) >60 mL/min    Comment: (NOTE) Calculated using the CKD-EPI Creatinine Equation (2021)    Anion gap 13 5 - 15    Comment: Performed at [facility] [address] [place], [place] [address]  Magnesium     Status: None   Collection Time: 01/10/23  9:45 PM  Result Value Ref Range   Magnesium 1.7 1.7 - 2.4 mg/dL    Comment: Performed at [facility] [address], [place], [place] [address]  APTT     Status: Abnormal   Collection Time: 01/10/23  9:45 PM  Result Value Ref Range   aPTT >200 (HH) 24 - 36 seconds    Comment:        IF BASELINE aPTT IS ELEVATED, SUGGEST PATIENT RISK ASSESSMENT BE USED TO DETERMINE APPROPRIATE ANTICOAGULANT THERAPY. REPEATED TO VERIFY CRITICAL RESULT CALLED TO, READ BACK BY AND VERIFIED WITH: NOTIFIED Huel Cote, RN AT 2242 ON 01/10/23 BY H. HOWARD. Performed at [facility], [place] [address], [place], [place] [address]   Protime-INR     Status: Abnormal   Collection Time: 01/10/23  9:45 PM  Result Value Ref Range   Prothrombin Time 17.2 (H) 11.4 - 15.2 seconds   INR 1.4 (H) 0.8 - 1.2    Comment: (NOTE) INR goal varies based on device and disease states. Performed at [facility], [place] [address] [place], [place] [address]   POCT Activated clotting time     Status: None   Collection Time: 01/10/23 10:26 PM  Result Value Ref Range   Activated Clotting Time 196 seconds    Comment: Reference range 74-137 seconds for patients not on anticoagulant therapy.  Glucose, capillary     Status: Abnormal   Collection Time: 01/10/23 10:27 PM  Result Value Ref Range   Glucose-Capillary 176 (H) 70 - 99 mg/dL    Comment: Glucose reference range applies only to samples taken after fasting for at least 8 hours.  POCT Activated clotting time     Status: None   Collection Time: 01/10/23 10:57 PM  Result Value Ref  Range   Activated Clotting Time 201 seconds    Comment: Reference range 74-137 seconds for patients not on anticoagulant therapy.  Cooxemetry Panel (carboxy, met, total hgb, O2 sat)     Status: Abnormal   Collection Time: 01/10/23 11:39 PM  Result Value Ref Range   Total hemoglobin 11.6 (L) 12.0 - 16.0 g/dL   O2 Saturation 53.5 %   Carboxyhemoglobin 1.3 0.5 - 1.5 %   Methemoglobin <0.7 0.0 - 1.5 %    Comment: Performed at [facility] [address], [place], [place] [address]  CBC     Status: Abnormal   Collection Time: 01/11/23  1:49 AM  Result Value Ref Range   WBC 8.3 4.0 - 10.5 K/uL   RBC 3.67 (L) 3.87 - 5.11 MIL/uL   Hemoglobin 10.7 (L) 12.0 - 15.0 g/dL   HCT 32.8 (L) 36.0 - 46.0 %   MCV 89.4 80.0 - 100.0 fL   MCH 29.2 26.0 - 34.0 pg  MCHC 32.6 30.0 - 36.0 g/dL   RDW 08.6 57.8 - 46.9 %   Platelets 249 150 - 400 K/uL   nRBC 0.0 0.0 - 0.2 %    Comment: Performed at [facility], [address], [place], [place] [address]  Basic metabolic panel     Status: Abnormal   Collection Time: 01/11/23  1:49 AM  Result Value Ref Range   Sodium 140 135 - 145 mmol/L   Potassium 3.9 3.5 - 5.1 mmol/L   Chloride 103 98 - 111 mmol/L   CO2 21 (L) 22 - 32 mmol/L   Glucose, Bld 164 (H) 70 - 99 mg/dL    Comment: Glucose reference range applies only to samples taken after fasting for at least 8 hours.   BUN 20 8 - 23 mg/dL   Creatinine, Ser 8.41 (H) 0.44 - 1.00 mg/dL   Calcium 8.5 (L) 8.9 - 10.3 mg/dL   GFR, Estimated 38 (L) >60 mL/min    Comment: (NOTE) Calculated using the CKD-EPI Creatinine Equation (2021)    Anion gap 16 (H) 5 - 15    Comment: Performed at [facility], [address], [place], [place] [address]  Lipid panel     Status: Abnormal   Collection Time: 01/11/23  1:49 AM  Result Value Ref Range   Cholesterol 87 0 - 200 mg/dL   Triglycerides 59 <102 mg/dL   HDL 33 (L) >72 mg/dL   Total CHOL/HDL Ratio 2.6 RATIO   VLDL 12 0 - 40 mg/dL   LDL Cholesterol  42 0 - 99 mg/dL    Comment:        Total Cholesterol/HDL:CHD Risk Coronary Heart Disease Risk Table                     Men   Women  1/2 Average Risk   3.4   3.3  Average Risk       5.0   4.4  2 X Average Risk   9.6   7.1  3 X Average Risk  23.4   11.0        Use the calculated Patient Ratio above and the CHD Risk Table to determine the patient's CHD Risk.        ATP III CLASSIFICATION (LDL):  <100     mg/dL   Optimal  536-644  mg/dL   Near or Above                    Optimal  130-159  mg/dL   Borderline  034-742  mg/dL   High  >595     mg/dL   Very High Performed at [facility], [address], [place], [place] [address]   Glucose, capillary     Status: Abnormal   Collection Time: 01/11/23  7:24 AM  Result Value Ref Range   Glucose-Capillary 102 (H) 70 - 99 mg/dL    Comment: Glucose reference range applies only to samples taken after fasting for at least 8 hours.    ECG   NSR at 67, LAFB, ST elevation has resolved - Personally Reviewed  Telemetry   Sinus rhythm with PVC's and brief runs of NSVT - Personally Reviewed  Radiology    DG CHEST PORT 1 VIEW  Result Date: 01/10/2023 CLINICAL DATA:  Check central line placement EXAM: PORTABLE CHEST 1 VIEW COMPARISON:  Film from earlier in the same day. FINDINGS: Cardiac shadow is stable. Aortic calcifications are  seen. Swan-Ganz catheter is noted extending into the pulmonary outflow tract. Increased density is noted over the right lung which may represent a posteriorly layering effusion or diffuse edema. Left lung is clear. No bony abnormality is noted. IMPRESSION: Swan-Ganz catheter as described. Increased density in the right hemithorax new from the prior exam. This may represent asymmetrical edema. Posterior fusion could cause this appearance as well. Electronically Signed   By: Alcide Clever M.D.   On: 01/10/2023 22:16   CARDIAC CATHETERIZATION  Result Date: 01/10/2023   Prox Cx to Mid Cx lesion is 40% stenosed.   Prox  RCA lesion is 30% stenosed.   Prox RCA to Mid RCA lesion is 30% stenosed.   Mid RCA lesion is 40% stenosed.   Dist Cx lesion is 100% stenosed.   3rd Mrg lesion is 100% stenosed.   Prox LAD lesion is 50% stenosed.   1st Diag lesion is 50% stenosed.   Mid LAD lesion is 20% stenosed.   Dist LAD lesion is 20% stenosed.   4th Mrg-1 lesion is 60% stenosed.   4th Mrg-2 lesion is 60% stenosed.   A drug-eluting stent was successfully placed.   Post intervention, there is a 0% residual stenosis.   Hemodynamic findings consistent with moderate pulmonary hypertension. Acute coronary syndrome/inferolateral STEMI secondary to distal occlusion of the circumflex marginal vessel. Concomitant CAD with 50% proximal LAD stenosis, 50% stenosis in the first diagonal branch, 20% mid stenoses. Mild 30 to 40% stenoses in the RCA. Left circumflex vessel with total occlusion and TIMI 0 flow to the distal marginal vessel supplying the inferolateral apex.  There also is total occlusion of a small distal branch and diffuse segmental 60% stenosis in a medial branch. Successful PCI to the distal circumflex with ultimate insertion of a 2.25 x 22 mm Onyx frontier stent dilated with stent taper from 2.27-2.2 distally. Insertion of intra-aortic balloon pump the right femoral artery in this patient with severe LV dysfunction and acute mitral regurgitation probably contributed by papillary muscle dysfunction.. Swan-Ganz catheterization demonstrating severe pulmonary hypertension with PA pressure 74/35, mean 48. RECOMMENDATION: Patient received DAPT with aspirin/Brilinta.  Will need close observation of hemodynamic status with balloon pump.  CCM consult.  Patient apparently is statin intolerant and and for future PCSK9 inhibition.   ECHOCARDIOGRAM LIMITED  Result Date: 01/10/2023    ECHOCARDIOGRAM REPORT   Patient Name:   Natasha Woodard Date of Exam: 01/10/2023 Medical Rec #:  [id number]         Height:       62.0 in Accession #:    [id number]         Weight:       159.1 lb Date of Birth:  February 05, 1941         BSA:          1.735 m Patient Age:    81 years          BP:           153/96 mmHg Patient Gender: F                 HR:           72 bpm. Exam Location:  Inpatient Procedure: Limited Echo and Color Doppler                       STAT ECHO Reported to: Dr Lennie Odor on 01/10/2023 5:20:00 PM. Indications:    Chest pain  History:  Patient has prior history of Echocardiogram examinations, most                 recent 01/28/2022. Signs/Symptoms:Shortness of Breath; Risk                 Factors:Diabetes, Hypertension, Dyslipidemia and Former Smoker.                 DOE. Dilated cardiomyopathy. CKD.  Sonographer:    Ross Ludwig RDCS (AE) Referring Phys: [id number] Perlie Gold  Sonographer Comments: Dr. Zoila Shutter bedside for duration of echo before patient transferred to cath lab. IMPRESSIONS  1. Akinesis of the apical to mid lateral wall and apex. All other segments are hypokinetic. Left ventricular ejection fraction, by estimation, is 20 to 25%. Left ventricular ejection fraction by 2D MOD biplane is 24.0 %. The left ventricle has severely decreased function. The left ventricle demonstrates regional wall motion abnormalities (see scoring diagram/findings for description). The left ventricular internal cavity size was mildly dilated. Left ventricular diastolic function could not be evaluated.  2. Right ventricular systolic function is mildly reduced. The right ventricular size is mildly enlarged.  3. Left atrial size was mild to moderately dilated.  4. Severe eccentric posteriorly directed mitral regurgitation. Given lateral WMA, suspect this is ischemic MR (IIIB) due to restricted PMVL. The mitral valve is grossly normal. Severe mitral valve regurgitation. No evidence of mitral stenosis.  5. The aortic valve is grossly normal. Aortic valve regurgitation is not visualized.  6. The inferior vena cava is normal in size with <50% respiratory variability,  suggesting right atrial pressure of 8 mmHg. Comparison(s): Prior images unable to be directly viewed, comparison made by report only. Changes from prior study are noted. The left ventricular function is significantly worse. Severe MR now present. Conclusion(s)/Recommendation(s): Findings consistent with ischemic cardiomyopathy. FINDINGS  Left Ventricle: Akinesis of the apical to mid lateral wall and apex. All other segments are hypokinetic. Left ventricular ejection fraction, by estimation, is 20 to 25%. Left ventricular ejection fraction by 2D MOD biplane is 24.0 %. The left ventricle has severely decreased function. The left ventricle demonstrates regional wall motion abnormalities. The left ventricular internal cavity size was mildly dilated. There is no left ventricular hypertrophy. Left ventricular diastolic function could not be evaluated.  LV Wall Scoring: The mid and distal lateral wall, mid anterolateral segment, and apex are akinetic. Right Ventricle: The right ventricular size is mildly enlarged. No increase in right ventricular wall thickness. Right ventricular systolic function is mildly reduced. Left Atrium: Left atrial size was mild to moderately dilated. Right Atrium: Right atrial size was normal in size. Pericardium: Trivial pericardial effusion is present. Mitral Valve: Severe eccentric posteriorly directed mitral regurgitation. Given lateral WMA, suspect this is ischemic MR (IIIB) due to restricted PMVL. The mitral valve is grossly normal. Severe mitral valve regurgitation. No evidence of mitral valve stenosis. Tricuspid Valve: The tricuspid valve is grossly normal. Tricuspid valve regurgitation is mild . No evidence of tricuspid stenosis. Aortic Valve: The aortic valve is grossly normal. Aortic valve regurgitation is not visualized. Pulmonic Valve: The pulmonic valve was not assessed. Aorta: The aortic root is normal in size and structure. Venous: The inferior vena cava is normal in size with  less than 50% respiratory variability, suggesting right atrial pressure of 8 mmHg. IAS/Shunts: The atrial septum is grossly normal.  LEFT VENTRICLE PLAX 2D  Biplane EF (MOD) LVIDd:         5.90 cm         LV Biplane EF:   Left LVIDs:         5.10 cm                          ventricular LV PW:         1.10 cm                          ejection LV IVS:        1.00 cm                          fraction by LVOT diam:     1.90 cm                          2D MOD LVOT Area:     2.84 cm                         biplane is                                                 24.0 %.  LV Volumes (MOD) LV vol d, MOD    108.0 ml A2C: LV vol d, MOD    136.0 ml A4C: LV vol s, MOD    80.5 ml A2C: LV vol s, MOD    106.0 ml A4C: LV SV MOD A2C:   27.5 ml LV SV MOD A4C:   136.0 ml LV SV MOD BP:    29.1 ml IVC IVC diam: 2.00 cm LEFT ATRIUM         Index LA diam:    5.10 cm 2.94 cm/m   AORTA Ao Root diam: 2.60 cm  SHUNTS Systemic Diam: 1.90 cm Eleonore Chiquito MD Electronically signed by Eleonore Chiquito MD Signature Date/Time: 01/10/2023/5:32:52 PM    Final    DG Chest 2 View  Result Date: 01/10/2023 CLINICAL DATA:  Chest pain.  Shortness of breath. EXAM: CHEST - 2 VIEW COMPARISON:  06/10/2022 FINDINGS: Stable cardiac enlargement. Aortic atherosclerosis. Blunting of the costophrenic angles noted with mild thickening along the fissures. Pulmonary vascular congestion. Atelectasis noted within the right lung base. IMPRESSION: 1. Cardiac enlargement and pulmonary vascular congestion. 2. Suspect small pleural effusions. 3.  Aortic Atherosclerosis (ICD10-I70.0). Electronically Signed   By: Kerby Moors M.D.   On: 01/10/2023 11:08    Cardiac Studies   See echo above  Assessment   Principal Problem:   NSTEMI (non-ST elevated myocardial infarction) [facility]) Active Problems:   ACS (acute coronary syndrome) ([place])   Ischemic cardiomyopathy   Nonrheumatic mitral valve regurgitation   Plan   Mrs. Sasaki feels better  today- no further chest pain and dyspnea has improved. IABP was removed d/t right groin bleeding/hematoma and poor distal pulse with cool extremities. Suspect some underlying PAD. Had good urine output overnight. Lactate was 2.0, repeated. Will continue diuresis today with 40 mg IV BID lasix. Magnesium has been repleted. Had some NSVT, will increase oral amiodarone to 400 mg daily. She is already on coreg 25 mg BID. Asked the advanced heart failure  service to evaluate today.   Time Spent Directly with Patient:  I have spent a total of 35 minutes with the patient reviewing hospital notes, telemetry, EKGs, labs and examining the patient as well as establishing an assessment and plan that was discussed personally with the patient.  > 50% of time was spent in direct patient care.  Length of Stay:  LOS: 0 days   Chrystie Nose, MD, [facility], FACP  [place]  [facility]  Medical Director of the Advanced Lipid Disorders &  Cardiovascular Risk Reduction Clinic Diplomate of the American Board of Clinical Lipidology Attending Cardiologist  Direct Dial: [phone]  Fax: [phone]  Website:  [email or web address]  Lisette Abu Tyniesha Howald 01/11/2023, 8:12 AM

## 2023-01-11 NOTE — Progress Notes (Signed)
Peripherally Inserted Central Catheter Placement  The IV Nurse has discussed with the patient and/or persons authorized to consent for the patient, the purpose of this procedure and the potential benefits and risks involved with this procedure.  The benefits include less needle sticks, lab draws from the catheter, and the patient may be discharged home with the catheter. Risks include, but not limited to, infection, bleeding, blood clot (thrombus formation), and puncture of an artery; nerve damage and irregular heartbeat and possibility to perform a PICC exchange if needed/ordered by physician.  Alternatives to this procedure were also discussed.  Bard Power PICC patient education guide, fact sheet on infection prevention and patient information card has been provided to patient /or left at bedside.    PICC Placement Documentation  PICC Double Lumen 01/11/23 Right Brachial 37 cm 0 cm (Active)  Indication for Insertion or Continuance of Line Vasoactive infusions 01/11/23 1247  Exposed Catheter (cm) 0 cm 01/11/23 1247  Site Assessment Clean, Dry, Intact 01/11/23 1247  Lumen #1 Status Flushed;Blood return noted;Saline locked 01/11/23 1247  Lumen #2 Status Flushed;Saline locked;Blood return noted 01/11/23 1247  Dressing Type Transparent;Securing device 01/11/23 1247  Dressing Status Antimicrobial disc in place 01/11/23 1247  Safety Lock Not Applicable 98/26/41 5830  Dressing Change Due 01/18/23 01/11/23 1247       Natasha Woodard 01/11/2023, 12:50 PM

## 2023-01-11 NOTE — Progress Notes (Signed)
Pt placed on Bipap overnight to rest. RT will continue to monitor.

## 2023-01-11 NOTE — Progress Notes (Addendum)
Called to assist with right groin bleeding. Manual pressure held over IABP right groin site for 1 hour. Site still profusely bleeding. Decision made with Dr. Claiborne Billings and the cardiology fellow, to remove the IABP to control groin bleeding. IABP removed at 23:07, manual pressure applied. Profuse bleeding still seen from the venous sheath with the indwelling swan. With the cardiology fellows discussion, the sheath and swan was removed at 23:40. Arterial blood seemed to spurt up around the venous sheath as it was removed from the right femoral vein.   Manual pressure was held over both sides for 1  hour.  Site rebleed and manual pressure held for another minutes. Site rebled again. Manual pressure continued for another 30 minutes.   Hemostasis achieved at 01:07:00.  Total manual pressure after IABP removal was 2 hours.   Tegaderm dressing applied, bedrest instructions given. Patient somewhat not compliant with keeping her right leg and hips still.  Area of soft hematoma present , distal to insertion sites. Hematoma size about 3 inches below site and extending 6 inches wide toward the groin. Area of hematoma marked.   During the sheath pull , patient seemed angry and somewhat confused, Requesting Dr. Claiborne Billings to keep going out to speak with her family , even though he had done so twice, and told her so.  The patient seemed upset that she had gone through this procedure with the pain of manual pressure on groin site. She was upset with Dr. Claiborne Billings, about the IABP in her leg and did not seem to understand it was a lifesaving measure. She asked Dr. Claiborne Billings to leave the room twice, that she did not want him to take care of her.

## 2023-01-11 NOTE — Op Note (Signed)
Date: January 11, 2023  Preoperative diagnosis: Right femoral pseudoaneurysm  Postoperative diagnosis: Same  Procedure: Thrombin injection right femoral pseudoaneurysm  Surgeon: Dr. Marty Heck, MD  Assistant: Cath Lab staff/vascular lab staff  Indications: 82 year old female admitted yesterday with a STEMI ultimately requiring PCI including an intra-aortic balloon pump in her right common femoral artery.  This was removed overnight after she had some oozing around the IABP and manual pressure was held for a long time.  Today she was noted to have a hematoma in the right groin and duplex showed residual pseudoaneurysm.  She presents for thrombin injection given there is a long neck after risks benefits discussed.  Findings: The right groin was sterilely prepped and under sterile ultrasound guidance we injected 3 mL of thrombin into the right common femoral pseudoaneurysm and this thrombosed.  There was still triphasic flow in the common femoral artery.  She has a PT monophasic signal at the ankle consistent with her preop exam.  Anesthesia: 10 mL 1% lidocaine without epinephrine  Details: Patient was taken to Cath Lab holding after risks benefits discussed.  We initially used ultrasound and evaluated pseudoaneurysm including the neck off the right common femoral artery  The right groin was then prepped draped in standard sterile fashion.  I used 1% lidocaine without epinephrine and anesthetize the skin over the pseudoaneurysm.  A ultrasound probe was then used that had a sterile sleeve.  We again evaluated the pseudoaneurysm with ultrasound and then I placed a micro access needle into the pseudoaneurysm cavity under ultrasound guidance.  I injected a total of 3 mL of thrombin under color flow.  We had thrombosis of the pseudoaneurysm cavity.  There was still triphasic flow in the common femoral artery.  There was monophasic flow in the PT consistent with her preop exam.  She tolerated the  procedure without any complications.  Will repeat a duplex in the morning.  Complications: None  Condition: Stable  Marty Heck, MD [facility] Office: [place]

## 2023-01-11 NOTE — Progress Notes (Signed)
Heart Failure Navigator Progress Note  Assessed for Heart & Vascular TOC clinic readiness.  Patient does not meet criteria due to Advanced Heart Failure Team consult. .   Navigator will sign off at this time.    Shanik Brookshire, BSN, RN Heart Failure Nurse Navigator Secure Chat Only   

## 2023-01-11 NOTE — Progress Notes (Signed)
Lower extremity pseudo injection has been completed.   Preliminary results in CV Proc.   Natasha Woodard Blank 01/11/2023 4:14 PM

## 2023-01-11 NOTE — Progress Notes (Signed)
Pt not on Bipap at this time, no distress noted. Pt is on 2L [place] and tolerating well. RT will continue to monitor.

## 2023-01-11 NOTE — TOC Benefit Eligibility Note (Signed)
Patient Advocate Encounter  Insurance verification completed.    The patient is currently admitted and upon discharge could be taking Brilinta 90 mg.  The current 30 day co-pay is $47.00.   The patient is insured through [employer] Medicare Part D   Terrian Ridlon, CPHT Pharmacy Patient Advocate Specialist [facility] Patient Advocate Team Direct Number: [phone]  Fax: [phone]       

## 2023-01-12 ENCOUNTER — Encounter (HOSPITAL_COMMUNITY): Payer: Self-pay | Admitting: Internal Medicine

## 2023-01-12 ENCOUNTER — Ambulatory Visit: Payer: Medicare Other | Admitting: Pulmonary Disease

## 2023-01-12 ENCOUNTER — Inpatient Hospital Stay (HOSPITAL_COMMUNITY): Payer: Medicare Other

## 2023-01-12 DIAGNOSIS — I214 Non-ST elevation (NSTEMI) myocardial infarction: Secondary | ICD-10-CM | POA: Diagnosis not present

## 2023-01-12 DIAGNOSIS — I724 Aneurysm of artery of lower extremity: Secondary | ICD-10-CM

## 2023-01-12 LAB — CBC
HCT: 26.7 % — ABNORMAL LOW (ref 36.0–46.0)
Hemoglobin: 8.3 g/dL — ABNORMAL LOW (ref 12.0–15.0)
MCH: 29.2 pg (ref 26.0–34.0)
MCHC: 31.1 g/dL (ref 30.0–36.0)
MCV: 94 fL (ref 80.0–100.0)
Platelets: 213 10*3/uL (ref 150–400)
RBC: 2.84 MIL/uL — ABNORMAL LOW (ref 3.87–5.11)
RDW: 15.1 % (ref 11.5–15.5)
WBC: 12.2 10*3/uL — ABNORMAL HIGH (ref 4.0–10.5)
nRBC: 0.3 % — ABNORMAL HIGH (ref 0.0–0.2)

## 2023-01-12 LAB — BASIC METABOLIC PANEL
Anion gap: 7 (ref 5–15)
BUN: 24 mg/dL — ABNORMAL HIGH (ref 8–23)
CO2: 27 mmol/L (ref 22–32)
Calcium: 8 mg/dL — ABNORMAL LOW (ref 8.9–10.3)
Chloride: 99 mmol/L (ref 98–111)
Creatinine, Ser: 1.86 mg/dL — ABNORMAL HIGH (ref 0.44–1.00)
GFR, Estimated: 27 mL/min — ABNORMAL LOW (ref >60)
Glucose, Bld: 152 mg/dL — ABNORMAL HIGH (ref 70–99)
Potassium: 3.9 mmol/L (ref 3.5–5.1)
Sodium: 133 mmol/L — ABNORMAL LOW (ref 135–145)

## 2023-01-12 LAB — GLUCOSE, CAPILLARY
Glucose-Capillary: 182 mg/dL — ABNORMAL HIGH (ref 70–99)
Glucose-Capillary: 111 mg/dL — ABNORMAL HIGH (ref 70–99)
Glucose-Capillary: 188 mg/dL — ABNORMAL HIGH (ref 70–99)
Glucose-Capillary: 161 mg/dL — ABNORMAL HIGH (ref 70–99)

## 2023-01-12 LAB — COOXEMETRY PANEL
Carboxyhemoglobin: 1.6 % — ABNORMAL HIGH (ref 0.5–1.5)
Methemoglobin: <0.7 % (ref 0.0–1.5)
O2 Saturation: 60.8 %
Total hemoglobin: 8.4 g/dL — ABNORMAL LOW (ref 12.0–16.0)

## 2023-01-12 LAB — LACTIC ACID, PLASMA: Lactic Acid, Venous: 1 mmol/L (ref 0.5–1.9)

## 2023-01-12 LAB — LIPOPROTEIN A (LPA): Lipoprotein (a): 62.2 nmol/L — ABNORMAL HIGH (ref <75.0)

## 2023-01-12 LAB — MAGNESIUM: Magnesium: 2.7 mg/dL — ABNORMAL HIGH (ref 1.7–2.4)

## 2023-01-12 MED ORDER — POTASSIUM CHLORIDE CRYS ER 20 MEQ PO TBCR
40.0000 meq | EXTENDED_RELEASE_TABLET | Freq: Once | ORAL | Status: AC
  Administered 2023-01-12: 40 meq via ORAL
  Filled 2023-01-12: qty 2

## 2023-01-12 MED ORDER — SODIUM CHLORIDE 0.9 % IV SOLN: INTRAVENOUS | Status: DC

## 2023-01-12 MED ORDER — FUROSEMIDE 10 MG/ML IJ SOLN: 40.0000 mg | Freq: Two times a day (BID) | INTRAMUSCULAR | Status: DC

## 2023-01-12 MED ORDER — AMIODARONE HCL IN DEXTROSE 360-4.14 MG/200ML-% IV SOLN
60.0000 mg/h | INTRAVENOUS | Status: AC
  Administered 2023-01-12 (×2): 60 mg/h via INTRAVENOUS
  Filled 2023-01-12 (×2): qty 200

## 2023-01-12 MED ORDER — AMIODARONE HCL IN DEXTROSE 360-4.14 MG/200ML-% IV SOLN
30.0000 mg/h | INTRAVENOUS | Status: DC
  Administered 2023-01-13 – 2023-01-19 (×14): 30 mg/h via INTRAVENOUS
  Administered 2023-01-19 (×2): 60 mg/h via INTRAVENOUS
  Administered 2023-01-20 – 2023-01-23 (×9): 30 mg/h via INTRAVENOUS
  Filled 2023-01-12 (×26): qty 200

## 2023-01-12 NOTE — Progress Notes (Addendum)
Advanced Heart Failure Rounding Note  PCP-Cardiologist: Dr. Herbie Baltimore AHF: Dr. Gala Romney   Patient Profile    82 y/o female w/ prior h/o nonischemic CM that was PVC mediated, EF recovered w/ suppression of PVCs. Now admitted for acute lateral wall MI 2/2 distal LCx occlusion c/b severe ischemic MR and acute systolic heart failure w/ low output, CI 2.1 on RHC. Required placement of IABP, but later removed given concerns for developing limb ischemia=>found to have Right femoral PSA   Subjective:    No CP overnight. She has on Bipap (wears overnight for sleep apnea).   On Milrinone 0.25 + 2 of NE started due to low BP overnight. Co-ox 61% today. Lactic acid 2.2>>4.1>>pending   2L in UOP yesterday. CVP 10 this morning.  Creatinine stable 1.45.   Frequent NSVT overnight, K 3.6, Mg 2.0.   Underwent thrombin injection to right femoral PSA yesterday.  Hgb 10.3 => 8.3.    Objective:   Weight Range: 74.5 kg Body mass index is 30.04 kg/m.   Vital Signs:   Temp:  [98.1 F (36.7 C)-99.9 F (37.7 C)] 99.2 F (37.3 C) (02/01 0400) Pulse Rate:  [58-108] 67 (02/01 0645) Resp:  [14-37] 21 (02/01 0645) BP: (58-139)/(43-124) 123/107 (02/01 0645) SpO2:  [83 %-100 %] 98 % (02/01 0645) FiO2 (%):  [40 %] 40 % (02/01 0400) Weight:  [74.5 kg] 74.5 kg (02/01 0500)    Weight change: Filed Weights   01/11/23 0409 01/12/23 0500  Weight: 67.4 kg 74.5 kg    Intake/Output:   Intake/Output Summary (Last 24 hours) at 01/12/2023 0725 Last data filed at 01/12/2023 0600 Gross per 24 hour  Intake 406.1 ml  Output 1160 ml  Net -753.9 ml      Physical Exam    General:  elderly sleeping w/ bipap. No resp difficulty HEENT: Normal Neck: Supple. JVP 10 cm. Carotids 2+ bilat; no bruits. No lymphadenopathy or thyromegaly appreciated. Cor: PMI nondisplaced. Regular rate & rhythm. 2/6 HSM apex.  Lungs: Clear Abdomen: Soft, nontender, nondistended. No hepatosplenomegaly. No bruits or masses. Good  bowel sounds. Extremities: No cyanosis, clubbing, rash. Trace ankle edema.  Neuro: Alert & orientedx3, cranial nerves grossly intact. moves all 4 extremities w/o difficulty. Affect pleasant   Telemetry   NSR w/ frequent NSVT (personally reviewed)  EKG    N/A   Labs    CBC Recent Labs    01/10/23 1046 01/10/23 1644 01/11/23 0850 01/11/23 1944 01/12/23 0400  WBC 6.0   < > 12.5*  --  12.2*  NEUTROABS 4.1  --   --   --   --   HGB 13.2   < > 10.3* 9.2* 8.3*  HCT 40.6   < > 32.5* 27.0* 26.7*  MCV 90.4   < > 91.5  --  94.0  PLT 310   < > 234  --  213   < > = values in this interval not displayed.   Basic Metabolic Panel Recent Labs    03/47/42 2145 01/11/23 0149 01/11/23 0850 01/11/23 1930 01/11/23 1944  NA 141   < > 141 137 136  K 3.6   < > 4.7 3.6 3.7  CL 103   < > 102 100  --   CO2 25   < > 23 25  --   GLUCOSE 173*   < > 105* 180*  --   BUN 18   < > 17 18  --   CREATININE 1.38*   < >  1.44* 1.45*  --   CALCIUM 9.0   < > 8.8* 8.3*  --   MG 1.7  --   --  2.0  --    < > = values in this interval not displayed.   Liver Function Tests Recent Labs    01/10/23 1046 01/10/23 2145  AST 18 209*  ALT 20 37  ALKPHOS 50 49  BILITOT 1.1 1.1  PROT 6.3* 6.3*  ALBUMIN 3.5 3.5   No results for input(s): "LIPASE", "AMYLASE" in the last 72 hours. Cardiac Enzymes No results for input(s): "CKTOTAL", "CKMB", "CKMBINDEX", "TROPONINI" in the last 72 hours.  BNP: BNP (last 3 results) Recent Labs    06/17/22 1000 08/31/22 1613 01/10/23 1048  BNP 1,397.5* 1,178.5* >4,500.0*    ProBNP (last 3 results) Recent Labs    06/09/22 1506  PROBNP 4,638*     D-Dimer No results for input(s): "DDIMER" in the last 72 hours. Hemoglobin A1C No results for input(s): "HGBA1C" in the last 72 hours. Fasting Lipid Panel Recent Labs    01/11/23 0149  CHOL 87  HDL 33*  LDLCALC 42  TRIG 59  CHOLHDL 2.6   Thyroid Function Tests No results for input(s): "TSH", "T4TOTAL",  "T3FREE", "THYROIDAB" in the last 72 hours.  Invalid input(s): "FREET3"  Other results:   Imaging    VAS [place] LOWER EXT ARTERIAL PSEUDO INJECTION  Result Date: 01/11/2023  ARTERIAL PSEUDOANEURYSM  Patient Name:  ZENIYAH PEASTER  Date of Exam:   01/11/2023 Medical Rec #: [id number]          Accession #:    [id number] Date of Birth: [birth year]          Patient Gender: F Patient Age:   59 years Exam Location:  [facility] Procedure:      VAS [place] LOWER EXTRMITY ARTERIAL PSEUDOANEURYSM INJECTION Referring Phys: Monica Martinez --------------------------------------------------------------------------------  Exam: Right groin Indications: Patient complains of groin pain. Performing Technologist: Archie Patten RVS  Examination Guidelines: A complete evaluation includes B-mode imaging, spectral Doppler, color Doppler, and power Doppler as needed of all accessible portions of each vessel. Bilateral testing is considered an integral part of a complete examination. Limited examinations for reoccurring indications may be performed as noted. +------------+----------+----------+------+----------+ Right DuplexPSV (cm/s) Waveform PlaqueComment(s) +------------+----------+----------+------+----------+ CFA            124    triphasic                  +------------+----------+----------+------+----------+ PFA             16    monophasic                 +------------+----------+----------+------+----------+  Summary: Successful right groin pseudoaneurysm injection with Dr. Carlis Abbott.  Diagnosing physician: Harold Barban MD Electronically signed by Harold Barban MD on 01/11/2023 at 7:16:10 PM.   --------------------------------------------------------------------------------    Final    VAS [place] LOWER EXTREMITY ARTERIAL DUPLEX  Result Date: 01/11/2023 LOWER EXTREMITY ARTERIAL DUPLEX STUDY Patient Name:  GENOVA KINER  Date of Exam:   01/11/2023 Medical Rec #: [id number]          Accession #:     [id number] Date of Birth: [birth year]          Patient Gender: F Patient Age:   61 years Exam Location:  [facility] Procedure:      VAS [place] LOWER EXTREMITY ARTERIAL DUPLEX Referring Phys: Chrissie Noa HILTY --------------------------------------------------------------------------------  Indications: History of PAD, weak pulses on clinical  examination, s/p right              femoral IABP with palpable mass/bruising.  Current ABI: N/A Limitations: Patient pain, tissue properties Comparison Study: 09-09-2021 Prior lower extremity arterial duplex bilateral                   showed occlusion of the proximal SFA with collaterals                   supplying the distal extremity. Performing Technologist: Darlin Coco RDMS, RVT  Examination Guidelines: A complete evaluation includes B-mode imaging, spectral Doppler, color Doppler, and power Doppler as needed of all accessible portions of each vessel. Bilateral testing is considered an integral part of a complete examination. Limited examinations for reoccurring indications may be performed as noted.  +-----------+--------+-----+-------------+----------------+--------------------+ RIGHT      PSV cm/sRatioStenosis     Waveform        Comments             +-----------+--------+-----+-------------+----------------+--------------------+ CFA Prox   181                       biphasic                             +-----------+--------+-----+-------------+----------------+--------------------+ CFA Mid                                              Pseudoaneurysm with                                                       residual lumen of                                                         4.4 x 2.2 cm arising                                                      from the proximal to                                                      mid common femoral                                                        artery                +-----------+--------+-----+-------------+----------------+--------------------+ CFA Distal 77  monophasic                           +-----------+--------+-----+-------------+----------------+--------------------+ DFA        66                        biphasic                             +-----------+--------+-----+-------------+----------------+--------------------+ SFA Prox   89           occludes                                                                  after                                                                     bifurcation                                       +-----------+--------+-----+-------------+----------------+--------------------+ SFA Mid    40                        monophasic      recannulized via                                                          collaterals          +-----------+--------+-----+-------------+----------------+--------------------+ SFA Distal 10                        monophasic                           +-----------+--------+-----+-------------+----------------+--------------------+ POP Prox   12                        monophasic                           +-----------+--------+-----+-------------+----------------+--------------------+ POP Mid    9                         monophasic                           +-----------+--------+-----+-------------+----------------+--------------------+ POP Distal 12                        monophasic                           +-----------+--------+-----+-------------+----------------+--------------------+  TP Trunk   16                        monophasic                           +-----------+--------+-----+-------------+----------------+--------------------+ ATA Prox   6                         dampened                                                                  monophasic                            +-----------+--------+-----+-------------+----------------+--------------------+ ATA Mid                 occluded                                          +-----------+--------+-----+-------------+----------------+--------------------+ ATA Distal              occluded                                          +-----------+--------+-----+-------------+----------------+--------------------+ PTA Prox   13                                                             +-----------+--------+-----+-------------+----------------+--------------------+ PTA Mid    25                                                             +-----------+--------+-----+-------------+----------------+--------------------+ PTA Distal 10                                                             +-----------+--------+-----+-------------+----------------+--------------------+ PERO Prox  14                                                             +-----------+--------+-----+-------------+----------------+--------------------+ PERO Mid   13                                                             +-----------+--------+-----+-------------+----------------+--------------------+  PERO Distal8                                                              +-----------+--------+-----+-------------+----------------+--------------------+ DP         4                                                              +-----------+--------+-----+-------------+----------------+--------------------+  Summary: Right: Total occlusion noted in the proximal superficial femoral artery with reconstitution of the mid segment via collaterals. Total occlusion noted in the mid to distal anterior tibial artery. Large pseudoaneurysm with residual lumen of 4.4 x 2.2 cm arising from the proximal to mid common femoral artery with a neck measuring 1.5 x 0.5 cm.  See table(s) above for measurements and observations.  Electronically signed by Harold Barban MD on 01/11/2023 at 7:15:33 PM.    Final    [place] EKG SITE RITE  Result Date: 01/11/2023 If Site Rite image not attached, placement could not be confirmed due to current cardiac rhythm.    Medications:     Scheduled Medications:  amiodarone  400 mg Oral Daily   aspirin  81 mg Oral Daily   Chlorhexidine Gluconate Cloth  6 each Topical Daily   digoxin  0.0625 mg Oral Daily   empagliflozin  10 mg Oral Daily   furosemide  40 mg Intravenous BID   insulin aspart  0-15 Units Subcutaneous TID WC    morphine injection  2 mg Intravenous Once   rosuvastatin  20 mg Oral QHS   sodium chloride flush  10-40 mL Intracatheter Q12H   sodium chloride flush  3 mL Intravenous Q12H   ticagrelor  90 mg Oral BID    Infusions:  sodium chloride     sodium chloride     milrinone 0.25 mcg/kg/min (01/12/23 0720)   norepinephrine (LEVOPHED) Adult infusion 2 mcg/min (01/12/23 0600)    PRN Medications: sodium chloride, acetaminophen, ipratropium-albuterol, nitroGLYCERIN, ondansetron (ZOFRAN) IV, sodium chloride flush, sodium chloride flush    Assessment/Plan   1. Cardiogenic shock/acute on chronic systolic CHF: Echo this admission with EF 20-25%, mild LV dilation, mild RV dysfunction, severe MR with restricted posterior leaflet.  Echo in 2/23 with EF 50-55%.  Patient has history of nonischemic cardiomyopathy (?PVC-mediated) that improved.  She reports 2 months of dyspnea then had lateral STEMI 1/30 with chest pain.  I do not think that her PLOM occlusion can explain the extent of her cardiomyopathy, so suspect EF may have been falling prior to this event (symptomatic at least 2 months).  Suspect mixed ischemic/nonischemic cardiomyopathy.  Infarct-related MR also plays a role (see below).  She initially had IABP for cardiogenic shock, now removed due to right leg ischemia. Lactic acid was elevated at 4.1. Started on milrinone 0.25 and NE added at 2 with low BP overnight.  Co-ox 61% today. Repeat lactate down to 1. CVP 10 this morning, getting IV Lasix.  - continue milrinone 0.25 for now.  - Wean NE as tolerated, decrease to 1 now.  - continue digoxin 0.0625  - continue Jardiance 10 mg daily.  -  BP too soft for additional GDMT currently.  - Lasix 40 mg IV bid x 2 doses today and replace K.  2. Mitral regurgitation: I reviewed echo from this admission, there is severe MR with restricted posterior leaflet, suspect infarct-related MR with PLOM occlusion.  - Will treat for now with milrinone and diuresis.  - She may benefit from mTEER, will need TEE this admission when stable (?tomorrow).   3. CAD: Lateral STEMI with occluded pLOM, treated with DES.   - Continue ASA 81 - Continue ticagrelor - Continue statin.  4. 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 On milrinone, has had frequent PVCs and NSVT.  - treat w/ amio gtt while on milrinone  5. Right femoral PSA: Now s/p thrombin injection by Dr. Carlis Abbott.  6. CKD stage 3: Creatinine stable 1.44.  7. Anemia: Hgb down to 8.3, had right femoral PSA.  - Check Fe studies.  - Trend CBC.  CRITICAL CARE Performed by: Loralie Champagne  Total critical care time: 40 minutes  Critical care time was exclusive of separately billable procedures and treating other patients.  Critical care was necessary to treat or prevent imminent or life-threatening deterioration.  Critical care was time spent personally by me on the following activities: development of treatment plan with patient and/or surrogate as well as nursing, discussions with consultants, evaluation of patient's response to treatment, examination of patient, obtaining history from patient or surrogate, ordering and performing treatments and interventions, ordering and review of laboratory studies, ordering and review of radiographic studies, pulse oximetry and re-evaluation of patient's condition.  Loralie Champagne 01/12/2023 9:16 AM  Repeat  BMET with creatinine up to 1.8.  She has had 1 dose of IV Lasix already this morning.  Will hold further Lasix for now and follow CVP.    Loralie Champagne 01/12/2023

## 2023-01-12 NOTE — Progress Notes (Addendum)
  Progress Note    01/12/2023 7:14 AM 2 Days Post-Op  Subjective:  says her right groin is sore when palpated.  Tm 99.2  Vitals:   01/12/23 0630 01/12/23 0645  BP: (!) 121/54 (!) 123/107  Pulse: 70 67  Resp: (!) 22 (!) 21  Temp:    SpO2: 98% 98%    Physical Exam: General:  no distress Lungs:  non labored Incisions:  right groin relatively soft;  Extremities:  monophasic right PT doppler signal.  Motor and sensory are in tact.  Abdomen:  soft  CBC    Component Value Date/Time   WBC 12.2 (H) 01/12/2023 0400   RBC 2.84 (L) 01/12/2023 0400   HGB 8.3 (L) 01/12/2023 0400   HGB 14.2 10/09/2020 0935   HCT 26.7 (L) 01/12/2023 0400   HCT 44.9 10/09/2020 0935   PLT 213 01/12/2023 0400   PLT 379 10/09/2020 0935   MCV 94.0 01/12/2023 0400   MCV 91 10/09/2020 0935   MCH 29.2 01/12/2023 0400   MCHC 31.1 01/12/2023 0400   RDW 15.1 01/12/2023 0400   RDW 13.4 10/09/2020 0935   LYMPHSABS 1.1 01/10/2023 1046   LYMPHSABS 1.1 10/09/2020 0935   MONOABS 0.5 01/10/2023 1046   EOSABS 0.2 01/10/2023 1046   EOSABS 0.2 10/09/2020 0935   BASOSABS 0.1 01/10/2023 1046   BASOSABS 0.1 10/09/2020 0935    BMET    Component Value Date/Time   NA 136 01/11/2023 1944   NA 145 (H) 07/07/2022 1007   K 3.7 01/11/2023 1944   CL 100 01/11/2023 1930   CO2 25 01/11/2023 1930   GLUCOSE 180 (H) 01/11/2023 1930   BUN 18 01/11/2023 1930   BUN 24 07/07/2022 1007   CREATININE 1.45 (H) 01/11/2023 1930   CREATININE 1.28 (H) 01/14/2022 1551   CALCIUM 8.3 (L) 01/11/2023 1930   GFRNONAA 36 (L) 01/11/2023 1930   GFRAA 49 (L) 10/09/2020 0933    INR    Component Value Date/Time   INR 1.4 (H) 01/10/2023 2145     Intake/Output Summary (Last 24 hours) at 01/12/2023 2878 Last data filed at 01/12/2023 0600 Gross per 24 hour  Intake 406.1 ml  Output 1160 ml  Net -753.9 ml      Assessment/Plan:  82 y.o. female is s/p:  Thrombin injection right femoral psa  2 Days Post-Op   -pt with monophasic  right PT doppler signal.  Motor and sensory are in tact right foot.  Right groin relatively soft and tender to palpation around hematoma    Leontine Locket, PA-C [facility] [phone] 01/12/2023 7:14 AM   I have seen and evaluated the patient. I agree with the PA note as documented above.  Repeat pseudoaneurysm duplex today shows pseudoaneurysm remains closed after thrombin injection yesterday.  Right groin is stable.  She has a monophasic PT signal at the right ankle consistent with her preop exam and has chronic PAD followed by Dr. Fletcher Anon.  Vascular will sign off.  Call with any further questions or concerns.  She denies any pain or numbness in her foot and states it feels fine.  Marty Heck, MD [facility] [place] Office: [phone]

## 2023-01-12 NOTE — Progress Notes (Signed)
VASCULAR LAB    Right follow up psuedoaneurysm has been performed.  See CV proc for preliminary results.   Terianna Peggs, RVT 01/12/2023, 11:37 AM

## 2023-01-13 ENCOUNTER — Other Ambulatory Visit (HOSPITAL_COMMUNITY): Payer: Medicare Other

## 2023-01-13 ENCOUNTER — Encounter (HOSPITAL_COMMUNITY)
Admission: EM | Disposition: A | Discharge status: Rehabilitation Facility | Payer: Self-pay | Source: Home / Self Care | Attending: Cardiology

## 2023-01-13 ENCOUNTER — Inpatient Hospital Stay (HOSPITAL_COMMUNITY): Payer: Medicare Other

## 2023-01-13 DIAGNOSIS — I214 Non-ST elevation (NSTEMI) myocardial infarction: Secondary | ICD-10-CM | POA: Diagnosis not present

## 2023-01-13 LAB — MAGNESIUM: Magnesium: 2.3 mg/dL (ref 1.7–2.4)

## 2023-01-13 LAB — IRON AND TIBC
Iron: 14 ug/dL — ABNORMAL LOW (ref 28–170)
Saturation Ratios: 5 % — ABNORMAL LOW (ref 10.4–31.8)
TIBC: 276 ug/dL (ref 250–450)
UIBC: 262 ug/dL

## 2023-01-13 LAB — COOXEMETRY PANEL
Carboxyhemoglobin: 1.2 % (ref 0.5–1.5)
Methemoglobin: <0.7 % (ref 0.0–1.5)
O2 Saturation: 77 %
Total hemoglobin: 7.2 g/dL — ABNORMAL LOW (ref 12.0–16.0)

## 2023-01-13 LAB — CBC
HCT: 21.8 % — ABNORMAL LOW (ref 36.0–46.0)
Hemoglobin: 6.8 g/dL — CL (ref 12.0–15.0)
MCH: 29.3 pg (ref 26.0–34.0)
MCHC: 31.2 g/dL (ref 30.0–36.0)
MCV: 94 fL (ref 80.0–100.0)
Platelets: 188 10*3/uL (ref 150–400)
RBC: 2.32 MIL/uL — ABNORMAL LOW (ref 3.87–5.11)
RDW: 15 % (ref 11.5–15.5)
WBC: 11.2 10*3/uL — ABNORMAL HIGH (ref 4.0–10.5)
nRBC: 0.2 % (ref 0.0–0.2)

## 2023-01-13 LAB — BASIC METABOLIC PANEL
Anion gap: 10 (ref 5–15)
BUN: 25 mg/dL — ABNORMAL HIGH (ref 8–23)
CO2: 25 mmol/L (ref 22–32)
Calcium: 7.7 mg/dL — ABNORMAL LOW (ref 8.9–10.3)
Chloride: 95 mmol/L — ABNORMAL LOW (ref 98–111)
Creatinine, Ser: 1.84 mg/dL — ABNORMAL HIGH (ref 0.44–1.00)
GFR, Estimated: 27 mL/min — ABNORMAL LOW (ref >60)
Glucose, Bld: 328 mg/dL — ABNORMAL HIGH (ref 70–99)
Potassium: 3.8 mmol/L (ref 3.5–5.1)
Sodium: 130 mmol/L — ABNORMAL LOW (ref 135–145)

## 2023-01-13 LAB — FERRITIN: Ferritin: 19 ng/mL (ref 11–307)

## 2023-01-13 LAB — HEMOGLOBIN AND HEMATOCRIT, BLOOD
HCT: 28.7 % — ABNORMAL LOW (ref 36.0–46.0)
Hemoglobin: 9.2 g/dL — ABNORMAL LOW (ref 12.0–15.0)

## 2023-01-13 LAB — GLUCOSE, CAPILLARY
Glucose-Capillary: 171 mg/dL — ABNORMAL HIGH (ref 70–99)
Glucose-Capillary: 165 mg/dL — ABNORMAL HIGH (ref 70–99)
Glucose-Capillary: 163 mg/dL — ABNORMAL HIGH (ref 70–99)

## 2023-01-13 LAB — PREPARE RBC (CROSSMATCH)

## 2023-01-13 LAB — FOLATE: Folate: >40 ng/mL (ref >5.9)

## 2023-01-13 LAB — ABO/RH: ABO/RH(D): O POS

## 2023-01-13 LAB — RETICULOCYTES
Immature Retic Fract: 37 % — ABNORMAL HIGH (ref 2.3–15.9)
RBC.: 2.32 MIL/uL — ABNORMAL LOW (ref 3.87–5.11)
Retic Count, Absolute: 107.2 10*3/uL (ref 19.0–186.0)
Retic Ct Pct: 4.6 % — ABNORMAL HIGH (ref 0.4–3.1)

## 2023-01-13 LAB — VITAMIN B12: Vitamin B-12: 298 pg/mL (ref 180–914)

## 2023-01-13 SURGERY — ECHOCARDIOGRAM, TRANSESOPHAGEAL
Anesthesia: Monitor Anesthesia Care

## 2023-01-13 MED ORDER — FUROSEMIDE 10 MG/ML IJ SOLN
40.0000 mg | Freq: Once | INTRAMUSCULAR | Status: AC
  Administered 2023-01-13: 40 mg via INTRAVENOUS
  Filled 2023-01-13: qty 4

## 2023-01-13 MED ORDER — ORAL CARE MOUTH RINSE
15.0000 mL | OROMUCOSAL | Status: DC | PRN
  Administered 2023-01-13: 15 mL via OROMUCOSAL

## 2023-01-13 MED ORDER — SODIUM CHLORIDE 0.9% IV SOLUTION: Freq: Once | INTRAVENOUS | Status: AC

## 2023-01-13 MED ORDER — TRAMADOL HCL 50 MG PO TABS
50.0000 mg | ORAL_TABLET | Freq: Two times a day (BID) | ORAL | Status: DC | PRN
  Administered 2023-01-13 – 2023-02-10 (×56): 50 mg via ORAL
  Filled 2023-01-13 (×57): qty 1

## 2023-01-13 MED ORDER — ROSUVASTATIN CALCIUM 5 MG PO TABS
10.0000 mg | ORAL_TABLET | Freq: Every day | ORAL | Status: DC
  Administered 2023-01-13 – 2023-02-18 (×37): 10 mg via ORAL
  Filled 2023-01-13 (×37): qty 2

## 2023-01-13 MED ORDER — IOHEXOL 9 MG/ML PO SOLN: 500.0000 mL | ORAL | Status: DC

## 2023-01-13 MED ORDER — POTASSIUM CHLORIDE CRYS ER 20 MEQ PO TBCR
20.0000 meq | EXTENDED_RELEASE_TABLET | Freq: Once | ORAL | Status: AC
  Administered 2023-01-13: 20 meq via ORAL
  Filled 2023-01-13: qty 1

## 2023-01-13 MED ORDER — EMPAGLIFLOZIN 10 MG PO TABS
10.0000 mg | ORAL_TABLET | Freq: Every day | ORAL | Status: DC
  Administered 2023-01-14 – 2023-01-24 (×11): 10 mg via ORAL
  Filled 2023-01-13 (×12): qty 1

## 2023-01-13 NOTE — Progress Notes (Addendum)
Advanced Heart Failure Rounding Note  PCP-Cardiologist: Dr. Herbie Baltimore AHF: Dr. Gala Romney   Patient Profile    82 y/o female w/ prior h/o nonischemic CM that was PVC mediated, EF recovered w/ suppression of PVCs. Now admitted for acute lateral wall MI 2/2 distal LCx occlusion c/b severe ischemic MR and acute systolic heart failure w/ low output, CI 2.1 on RHC. Required placement of IABP, but later removed given concerns for developing limb ischemia=>found to have Right femoral PSA   Subjective:   1/31 Underwent thrombin injection to right femoral PSA . Hgb drifting down 10.3>8.3>6.8  2/1 Started on amio drip to suppress PVCs. Got dose of IV lasix x1. CVP low.   On Milrinone 0.25 + 6 of NE . CO-OX 77%  Frequent NSVT overnight, K 3.8, Mg 2.3  Creatinine 1.4>1.8>1.8    Complaining of lower abd  and r thing pain. Denies SOB. Had BM 2/1.  Objective:   Weight Range: 74.5 kg Body mass index is 30.04 kg/m.   Vital Signs:   Temp:  [98 F (36.7 C)-99.3 F (37.4 C)] 98 F (36.7 C) (02/02 0400) Pulse Rate:  [37-104] 84 (02/02 0600) Resp:  [11-31] 25 (02/02 0600) BP: (74-121)/(38-72) 105/60 (02/02 0600) SpO2:  [88 %-100 %] 100 % (02/02 0600) FiO2 (%):  [40 %] 40 % (02/02 0400)    Weight change: Filed Weights   01/11/23 0409 01/12/23 0500  Weight: 67.4 kg 74.5 kg    Intake/Output:   Intake/Output Summary (Last 24 hours) at 01/13/2023 0732 Last data filed at 01/13/2023 5284 Gross per 24 hour  Intake 1272.07 ml  Output 740 ml  Net 532.07 ml    CVP 3-4   Physical Exam    General:   No resp difficulty HEENT: normal Neck: supple. no JVD. Carotids 2+ bilat; no bruits. No lymphadenopathy or thryomegaly appreciated. Cor: PMI nondisplaced. Regular rate & rhythm. No rubs, gallops or murmurs. Lungs: clear on room air.  Abdomen: soft,  lower abd tender, nondistended. No hepatosplenomegaly. No bruits or masses. Good bowel sounds. Extremities: no cyanosis, clubbing, rash, edema. R  thigh tender  Neuro: alert & orientedx3, cranial nerves grossly intact. moves all 4 extremities w/o difficulty. Affect pleasant   Telemetry   SR with PVCs with an episode of bradycardia down 30-40s this morning.   EKG    N/A   Labs    CBC Recent Labs    01/10/23 1046 01/10/23 1644 01/12/23 0400 01/13/23 0500  WBC 6.0   < > 12.2* 11.2*  NEUTROABS 4.1  --   --   --   HGB 13.2   < > 8.3* 6.8*  HCT 40.6   < > 26.7* 21.8*  MCV 90.4   < > 94.0 94.0  PLT 310   < > 213 188   < > = values in this interval not displayed.   Basic Metabolic Panel Recent Labs    13/24/40 0746 01/13/23 0500  NA 133* 130*  K 3.9 3.8  CL 99 95*  CO2 27 25  GLUCOSE 152* 328*  BUN 24* 25*  CREATININE 1.86* 1.84*  CALCIUM 8.0* 7.7*  MG 2.7* 2.3   Liver Function Tests Recent Labs    01/10/23 1046 01/10/23 2145  AST 18 209*  ALT 20 37  ALKPHOS 50 49  BILITOT 1.1 1.1  PROT 6.3* 6.3*  ALBUMIN 3.5 3.5   No results for input(s): "LIPASE", "AMYLASE" in the last 72 hours. Cardiac Enzymes No results for input(s): "CKTOTAL", "  CKMB", "CKMBINDEX", "TROPONINI" in the last 72 hours.  BNP: BNP (last 3 results) Recent Labs    06/17/22 1000 08/31/22 1613 01/10/23 1048  BNP 1,397.5* 1,178.5* >4,500.0*    ProBNP (last 3 results) Recent Labs    06/09/22 1506  PROBNP 4,638*     D-Dimer No results for input(s): "DDIMER" in the last 72 hours. Hemoglobin A1C No results for input(s): "HGBA1C" in the last 72 hours. Fasting Lipid Panel Recent Labs    01/11/23 0149  CHOL 87  HDL 33*  LDLCALC 42  TRIG 59  CHOLHDL 2.6   Thyroid Function Tests No results for input(s): "TSH", "T4TOTAL", "T3FREE", "THYROIDAB" in the last 72 hours.  Invalid input(s): "FREET3"  Other results:   Imaging    VAS [place] GROIN PSEUDOANEURYSM  Result Date: 01/13/2023  ARTERIAL PSEUDOANEURYSM  Patient Name:  AMI MALLY  Date of Exam:   01/12/2023 Medical Rec #: [id number]          Accession #:    [id number]  Date of Birth: [birth year]          Patient Gender: F Patient Age:   38 years Exam Location:  [facility] Procedure:      VAS [place] GROIN PSEUDOANEURYSM Referring Phys: Monica Martinez --------------------------------------------------------------------------------  Exam: Right groin Indications: Patient complains of Follow up post thrombin injection. Comparison Study: No prior study Performing Technologist: Sharion Dove RVS  Examination Guidelines: A complete evaluation includes B-mode imaging, spectral Doppler, color Doppler, and power Doppler as needed of all accessible portions of each vessel. Bilateral testing is considered an integral part of a complete examination. Limited examinations for reoccurring indications may be performed as noted.  Findings: A mixed echogenic structure measuring approximately 3.7 cm x 2.6 cm is visualized at the Right Groin with ultrasound characteristics of a hematoma. Pseudoaneurysm remains thrombosed post Thrombin injection.  Diagnosing physician: Jamelle Haring Electronically signed by Jamelle Haring on 01/13/2023 at 7:29:28 AM.   --------------------------------------------------------------------------------    Final      Medications:     Scheduled Medications:  aspirin  81 mg Oral Daily   Chlorhexidine Gluconate Cloth  6 each Topical Daily   digoxin  0.0625 mg Oral Daily   empagliflozin  10 mg Oral Daily   insulin aspart  0-15 Units Subcutaneous TID WC    morphine injection  2 mg Intravenous Once   rosuvastatin  20 mg Oral QHS   sodium chloride flush  10-40 mL Intracatheter Q12H   sodium chloride flush  3 mL Intravenous Q12H   ticagrelor  90 mg Oral BID    Infusions:  sodium chloride     sodium chloride     sodium chloride 20 mL/hr at 01/13/23 0600   amiodarone 60 mg/hr (01/13/23 0600)   milrinone 0.25 mcg/kg/min (01/13/23 0600)   norepinephrine (LEVOPHED) Adult infusion 4 mcg/min (01/13/23 0600)    PRN Medications: sodium chloride,  acetaminophen, ipratropium-albuterol, nitroGLYCERIN, ondansetron (ZOFRAN) IV, sodium chloride flush, sodium chloride flush    Assessment/Plan   1. Cardiogenic shock/acute on chronic systolic CHF: Echo this admission with EF 20-25%, mild LV dilation, mild RV dysfunction, severe MR with restricted posterior leaflet.  Echo in 2/23 with EF 50-55%.  Patient has history of nonischemic cardiomyopathy (?PVC-mediated) that improved.  She reports 2 months of dyspnea then had lateral STEMI 1/30 with chest pain.  Per Dr Jerami Tammen--> He did not  think that her PLOM occlusion can explain the extent of her cardiomyopathy, so suspect EF may have been falling prior  to this event (symptomatic at least 2 months).  Suspect mixed ischemic/nonischemic cardiomyopathy.  Infarct-related MR also plays a role (see below).  She initially had IABP for cardiogenic shock, now removed due to right leg ischemia. Lactic acid was elevated at 4.1>1. Started on milrinone 0.25 and NE added.  - CVP 3-4 Hold diuretics. Hold jardiance.  - continue milrinone 0.25 + norep 6 mcg. CO-OX 77%  - continue digoxin 0.0625  - BP too soft for additional GDMT currently.  2. Mitral regurgitation: Dr Aundra Dubin reviewed echo from this admission, there is severe MR with restricted posterior leaflet, suspect infarct-related MR with PLOM occlusion.  - Will treat for now with milrinone and diuresis.  - She may benefit from mTEER, will need TEE this admission. Today will discuss with Dr Aundra Dubin. .   3. CAD: Lateral STEMI with occluded pLOM, treated with DES.   - Hgb down to 6.8 . Give blood.  - Continue ASA 81 - Continue ticagrelor - Continue statin.  4. 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 On milrinone, has had frequent PVCs and NSVT.  - treat w/ amio gtt while on milrinone  5. Right femoral PSA: Now s/p thrombin injection by Dr. Carlis Abbott.  6. CKD stage 3: Creatinine trending up 1.4>1.86 > 1.84.  7. Anemia: Hgb down to 6.8 ,  had right femoral PSA.  - Check Fe studies.  - Type and screen. Given 2 units PRBCs.  8. Acute Hypoxic Respiratory Failure On Bipap wean tonight. Now on room air.   Discussed with family.   Amy Clegg NP-C  01/13/2023 7:32 AM  Patient seen with NP, agree with the above note.   Hgb down to 6.8, now on NE 6 and milrinone 0.25 with SBP in 100s and co-ox 77%.  She feels "bad" this morning.  CVP 8 on my read. Lactate 1.0, creatinine stable 1.84.   General: NAD Neck: JVP 8, no thyromegaly or thyroid nodule.  Lungs: Clear to auscultation bilaterally with normal respiratory effort. CV: Nondisplaced PMI.  Heart regular S1/S2, no S3/S4, 1/6 HSM apex.  Trace ankle edema.  Abdomen: Soft, nontender, no hepatosplenomegaly, no distention.  Skin: Intact without lesions or rashes.  Neurologic: Alert and oriented x 3.  Psych: Normal affect. Extremities: No clubbing or cyanosis. Groin cath site stable.  HEENT: Normal.   R PSA site appears stable but hgb down to 6.8.  This may be CBC catching up to blood loss at groin site, but will order CT abdomen/pelvis w/o contrast to rule out RP hematoma.  She will get 2 units PBCs with Lasix 40 mg IV between units.   Will work on weaning down NE today (suspect blood will help), continue current milrinone 0.25.  More PVCs since restarting NE but has now calmed down. Can decrease amiodarone to 30. Creatinine stable.   She will need eventual TEE to evaluate suspected infarct-related MR but would hold off today as I do not think she is stable enough.  Will cancel and consider Monday.   Continue ticagrelor and ASA post-PCI.   CRITICAL CARE Performed by: Loralie Champagne  Total critical care time: 40 minutes  Critical care time was exclusive of separately billable procedures and treating other patients.  Critical care was necessary to treat or prevent imminent or life-threatening deterioration.  Critical care was time spent personally by me on the following  activities: development of treatment plan with patient and/or surrogate as well as nursing, discussions with consultants, evaluation of patient's  response to treatment, examination of patient, obtaining history from patient or surrogate, ordering and performing treatments and interventions, ordering and review of laboratory studies, ordering and review of radiographic studies, pulse oximetry and re-evaluation of patient's condition.  Loralie Champagne 01/13/2023 8:33 AM

## 2023-01-14 DIAGNOSIS — I5023 Acute on chronic systolic (congestive) heart failure: Secondary | ICD-10-CM | POA: Diagnosis not present

## 2023-01-14 DIAGNOSIS — I214 Non-ST elevation (NSTEMI) myocardial infarction: Secondary | ICD-10-CM | POA: Diagnosis not present

## 2023-01-14 DIAGNOSIS — I2510 Atherosclerotic heart disease of native coronary artery without angina pectoris: Secondary | ICD-10-CM | POA: Diagnosis not present

## 2023-01-14 DIAGNOSIS — R570 Cardiogenic shock: Secondary | ICD-10-CM | POA: Diagnosis not present

## 2023-01-14 LAB — CBC
HCT: 26.9 % — ABNORMAL LOW (ref 36.0–46.0)
Hemoglobin: 8.6 g/dL — ABNORMAL LOW (ref 12.0–15.0)
MCH: 29.7 pg (ref 26.0–34.0)
MCHC: 32 g/dL (ref 30.0–36.0)
MCV: 92.8 fL (ref 80.0–100.0)
Platelets: 174 10*3/uL (ref 150–400)
RBC: 2.9 MIL/uL — ABNORMAL LOW (ref 3.87–5.11)
RDW: 15.1 % (ref 11.5–15.5)
WBC: 9.5 10*3/uL (ref 4.0–10.5)
nRBC: 0 % (ref 0.0–0.2)

## 2023-01-14 LAB — BASIC METABOLIC PANEL
Anion gap: 9 (ref 5–15)
BUN: 25 mg/dL — ABNORMAL HIGH (ref 8–23)
CO2: 24 mmol/L (ref 22–32)
Calcium: 7.6 mg/dL — ABNORMAL LOW (ref 8.9–10.3)
Chloride: 96 mmol/L — ABNORMAL LOW (ref 98–111)
Creatinine, Ser: 1.66 mg/dL — ABNORMAL HIGH (ref 0.44–1.00)
GFR, Estimated: 31 mL/min — ABNORMAL LOW (ref >60)
Glucose, Bld: 343 mg/dL — ABNORMAL HIGH (ref 70–99)
Potassium: 3.8 mmol/L (ref 3.5–5.1)
Sodium: 129 mmol/L — ABNORMAL LOW (ref 135–145)

## 2023-01-14 LAB — GLUCOSE, CAPILLARY
Glucose-Capillary: 148 mg/dL — ABNORMAL HIGH (ref 70–99)
Glucose-Capillary: 229 mg/dL — ABNORMAL HIGH (ref 70–99)
Glucose-Capillary: 166 mg/dL — ABNORMAL HIGH (ref 70–99)
Glucose-Capillary: 222 mg/dL — ABNORMAL HIGH (ref 70–99)

## 2023-01-14 LAB — COOXEMETRY PANEL
Carboxyhemoglobin: 1.4 % (ref 0.5–1.5)
Methemoglobin: 0.7 % (ref 0.0–1.5)
O2 Saturation: 79.6 %
Total hemoglobin: 9 g/dL — ABNORMAL LOW (ref 12.0–16.0)

## 2023-01-14 LAB — MAGNESIUM: Magnesium: 2.4 mg/dL (ref 1.7–2.4)

## 2023-01-14 NOTE — Progress Notes (Signed)
RT NOTE: patient resting comfortably this AM on bipap.  Per patient's son, patient wears CPAP at home while sleeping.  Will leave patient on bipap at this time.  Will give patient a break off bipap once awake.  Will continue to monitor.

## 2023-01-14 NOTE — Progress Notes (Signed)
Patient increasing PVCs, transitioning to Ventricular Trigeminy. Notified Dr. Cathie Hoops and verbal order received to increase amiodarone drip to 60mg /hr.

## 2023-01-14 NOTE — Progress Notes (Signed)
Advanced Heart Failure Rounding Note  PCP-Cardiologist: Dr. Ellyn Hack AHF: Dr. 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 Now admitted for acute lateral wall MI 2/2 distal LCx occlusion c/b severe ischemic MR and acute systolic heart failure w/ low output, CI 2.1 on RHC. Required placement of IABP, but later removed given concerns for developing limb ischemia=>found to have Right femoral PSA   Subjective:   1/31 Underwent thrombin injection to right femoral PSA . Hgb drifting down 10.3>8.3>6.8  2/1 Started on amio drip to suppress PVCs. Got dose of IV lasix x1. CVP low.   Off/on BIPAP.   Developed AF last night around midnight. Now back in SR with PACs on IV amio   Remains on NE 8 and milrinone 0.25  Resting comfortably. No CP or SOB    Objective:   Weight Range: 75 kg Body mass index is 30.24 kg/m.   Vital Signs:   Temp:  [97.9 F (36.6 C)-99.1 F (37.3 C)] 97.9 F (36.6 C) (02/03 1139) Pulse Rate:  [36-85] 45 (02/03 0813) Resp:  [10-28] 20 (02/03 0813) BP: (77-110)/(35-88) 97/50 (02/03 0813) SpO2:  [92 %-100 %] 100 % (02/03 0813) FiO2 (%):  [40 %] 40 % (02/03 0813) Weight:  [75 kg] 75 kg (02/03 0600) Last BM Date : 01/13/23  Weight change: Filed Weights   01/12/23 0500 01/13/23 0500 01/14/23 0600  Weight: 74.5 kg 71.6 kg 75 kg    Intake/Output:   Intake/Output Summary (Last 24 hours) at 01/14/2023 1204 Last data filed at 01/14/2023 0700 Gross per 24 hour  Intake 1406.48 ml  Output 1225 ml  Net 181.48 ml      Physical Exam    General: Lying in bed  No resp difficulty HEENT: normal Neck: supple. no JVD. Carotids 2+ bilat; no bruits. No lymphadenopathy or thryomegaly appreciated. Cor: PMI nondisplaced. Brady regular 2/6 MR  Lungs: clear Abdomen: soft, nontender, nondistended. No hepatosplenomegaly. No bruits or masses. Good bowel sounds. Extremities: no cyanosis,  clubbing, rash, edema R groin site stable  Neuro: alert & orientedx3, cranial nerves grossly intact. moves all 4 extremities w/o difficulty. Affect pleasant  Telemetry   AF overnight -> Now in sinus brady occasionl PVCs. Personally reviewed  Labs    CBC Recent Labs    01/13/23 0500 01/13/23 1959 01/14/23 0425  WBC 11.2*  --  9.5  HGB 6.8* 9.2* 8.6*  HCT 21.8* 28.7* 26.9*  MCV 94.0  --  92.8  PLT 188  --  973    Basic Metabolic Panel Recent Labs    01/13/23 0500 01/14/23 0425  NA 130* 129*  K 3.8 3.8  CL 95* 96*  CO2 25 24  GLUCOSE 328* 343*  BUN 25* 25*  CREATININE 1.84* 1.66*  CALCIUM 7.7* 7.6*  MG 2.3 2.4    Liver Function Tests No results for input(s): "AST", "ALT", "ALKPHOS", "BILITOT", "PROT", "ALBUMIN" in the last 72 hours.  No results for input(s): "LIPASE", "AMYLASE" in the last 72 hours. Cardiac Enzymes No results for input(s): "CKTOTAL", "CKMB", "CKMBINDEX", "TROPONINI" in the last 72 hours.  BNP: BNP (last 3 results) Recent Labs    06/17/22 1000 08/31/22 1613 01/10/23 1048  BNP 1,397.5* 1,178.5* >4,500.0*     ProBNP (last 3 results) Recent Labs    06/09/22 1506  PROBNP 4,638*      D-Dimer No results for input(s): "DDIMER" in the last  72 hours. Hemoglobin A1C No results for input(s): "HGBA1C" in the last 72 hours. Fasting Lipid Panel No results for input(s): "CHOL", "HDL", "LDLCALC", "TRIG", "CHOLHDL", "LDLDIRECT" in the last 72 hours.  Thyroid Function Tests No results for input(s): "TSH", "T4TOTAL", "T3FREE", "THYROIDAB" in the last 72 hours.  Invalid input(s): "FREET3"  Other results:   Imaging    No results found.   Medications:     Scheduled Medications:  aspirin  81 mg Oral Daily   Chlorhexidine Gluconate Cloth  6 each Topical Daily   digoxin  0.0625 mg Oral Daily   empagliflozin  10 mg Oral Daily   insulin aspart  0-15 Units Subcutaneous TID WC    morphine injection  2 mg Intravenous Once   rosuvastatin   10 mg Oral QHS   sodium chloride flush  10-40 mL Intracatheter Q12H   sodium chloride flush  3 mL Intravenous Q12H   ticagrelor  90 mg Oral BID    Infusions:  sodium chloride     sodium chloride     sodium chloride Stopped (01/13/23 1131)   amiodarone 30 mg/hr (01/14/23 0903)   milrinone 0.25 mcg/kg/min (01/14/23 0700)   norepinephrine (LEVOPHED) Adult infusion 8 mcg/min (01/14/23 0700)    PRN Medications: sodium chloride, acetaminophen, ipratropium-albuterol, nitroGLYCERIN, ondansetron (ZOFRAN) IV, mouth rinse, sodium chloride flush, sodium chloride flush, traMADol    Assessment/Plan   1. Cardiogenic shock/acute on chronic systolic CHF: Echo this admission with EF 20-25%, mild LV dilation, mild RV dysfunction, severe MR with restricted posterior leaflet.  Echo in 2/23 with EF 50-55%.  Patient has history of nonischemic cardiomyopathy (?PVC-mediated) that improved.  She reports 2 months of dyspnea then had lateral STEMI 1/30 with chest pain.  Per Dr McLean--> He did not  think that her PLOM occlusion can explain the extent of her cardiomyopathy, so suspect EF may have been falling prior to this event (symptomatic at least 2 months).  Suspect mixed ischemic/nonischemic cardiomyopathy.  Infarct-related MR also plays a role (see below).  She initially had IABP for cardiogenic shock, now removed due to right leg ischemia. Lactic acid was elevated at 4.1>1. Started on milrinone 0.25 and NE added.  - CVP 3 - continue milrinone 0.25 + NE 8 mcg. CO-OX 80%  - Wean NE as tolerated. Can start midodrine as needed - can stop dig with amio  - BP too soft for additional GDMT currently.  2. Mitral regurgitation: severe MR with restricted posterior leaflet, suspect infarct-related MR with PLOM occlusion.  - Continue milrinone - Plan TEE this week   3. CAD: Lateral STEMI with occluded pLOM, treated with DES.   - No s/s angina - Continue ASA 81 - Continue ticagrelor - Continue statin.  4. PVCs: She  has h/o PVC-related cardiomyopathy that resolved in the past. On milrinone, has had frequent PVCs and NSVT.  - continue amio  5. Right femoral PSA: Now s/p thrombin injection by Dr. Carlis Abbott.  - groin site stable  6. AKI on CKD stage 3b - Baseline 1.3-1.5 Peak 1.84 ins eetting of ATN  - Scr 1.6  - Resume Jardiance at some point  7. Acute blood loss anemia: Hgb down to 6.8 -> 2u -> 9.2 -> 8.6 , had right femoral PSA.  - will follow  8. Acute Hypoxic Respiratory Failure - resolving. Now off bipap 9. PAF - now back in NSR on IV amio  - will hold off on [facility] today. If hgb stable will start DOAC tomorrow (and switch Brilinta  to Plavix). D/w Pharmd   CRITICAL CARE Performed by: Glori Bickers  Total critical care time: 35 minutes  Critical care time was exclusive of separately billable procedures and treating other patients.  Critical care was necessary to treat or prevent imminent or life-threatening deterioration.  Critical care was time spent personally by me (independent of midlevel providers or residents) on the following activities: development of treatment plan with patient and/or surrogate as well as nursing, discussions with consultants, evaluation of patient's response to treatment, examination of patient, obtaining history from patient or surrogate, ordering and performing treatments and interventions, ordering and review of laboratory studies, ordering and review of radiographic studies, pulse oximetry and re-evaluation of patient's condition.   Glori Bickers MD 01/14/2023 12:04 PM

## 2023-01-14 NOTE — Progress Notes (Signed)
Date and time results received: 01/13/23 0700   Test: Hgb Critical Value: 6.8  Name of Provider Notified: Rounding HF Team

## 2023-01-15 ENCOUNTER — Encounter (HOSPITAL_COMMUNITY): Payer: Self-pay | Admitting: Internal Medicine

## 2023-01-15 ENCOUNTER — Inpatient Hospital Stay (HOSPITAL_COMMUNITY): Payer: Medicare Other | Admitting: Anesthesiology

## 2023-01-15 ENCOUNTER — Encounter (HOSPITAL_COMMUNITY)
Admission: EM | Disposition: A | Discharge status: Rehabilitation Facility | Payer: Self-pay | Source: Home / Self Care | Attending: Cardiology

## 2023-01-15 ENCOUNTER — Other Ambulatory Visit: Payer: Self-pay

## 2023-01-15 ENCOUNTER — Inpatient Hospital Stay (HOSPITAL_COMMUNITY): Payer: Medicare Other

## 2023-01-15 DIAGNOSIS — R570 Cardiogenic shock: Secondary | ICD-10-CM | POA: Diagnosis not present

## 2023-01-15 DIAGNOSIS — G4730 Sleep apnea, unspecified: Secondary | ICD-10-CM

## 2023-01-15 DIAGNOSIS — I724 Aneurysm of artery of lower extremity: Secondary | ICD-10-CM

## 2023-01-15 DIAGNOSIS — Z7984 Long term (current) use of oral hypoglycemic drugs: Secondary | ICD-10-CM

## 2023-01-15 DIAGNOSIS — I2510 Atherosclerotic heart disease of native coronary artery without angina pectoris: Secondary | ICD-10-CM | POA: Diagnosis not present

## 2023-01-15 DIAGNOSIS — I214 Non-ST elevation (NSTEMI) myocardial infarction: Secondary | ICD-10-CM | POA: Diagnosis not present

## 2023-01-15 DIAGNOSIS — E1151 Type 2 diabetes mellitus with diabetic peripheral angiopathy without gangrene: Secondary | ICD-10-CM

## 2023-01-15 DIAGNOSIS — I5023 Acute on chronic systolic (congestive) heart failure: Secondary | ICD-10-CM | POA: Diagnosis not present

## 2023-01-15 HISTORY — PX: APPLICATION OF WOUND VAC: SHX5189

## 2023-01-15 HISTORY — PX: HEMATOMA EVACUATION: SHX5118

## 2023-01-15 HISTORY — PX: ARTERY REPAIR: SHX5117

## 2023-01-15 LAB — PREPARE FRESH FROZEN PLASMA
Unit division: 0
Unit division: 0
Unit division: 0
Unit division: 0

## 2023-01-15 LAB — CBC
HCT: 27.3 % — ABNORMAL LOW (ref 36.0–46.0)
Hemoglobin: 8.6 g/dL — ABNORMAL LOW (ref 12.0–15.0)
MCH: 29.5 pg (ref 26.0–34.0)
MCHC: 31.5 g/dL (ref 30.0–36.0)
MCV: 93.5 fL (ref 80.0–100.0)
Platelets: 234 10*3/uL (ref 150–400)
RBC: 2.92 MIL/uL — ABNORMAL LOW (ref 3.87–5.11)
RDW: 15.1 % (ref 11.5–15.5)
WBC: 8 10*3/uL (ref 4.0–10.5)
nRBC: 0 % (ref 0.0–0.2)

## 2023-01-15 LAB — BASIC METABOLIC PANEL
Anion gap: 9 (ref 5–15)
BUN: 20 mg/dL (ref 8–23)
CO2: 27 mmol/L (ref 22–32)
Calcium: 7.7 mg/dL — ABNORMAL LOW (ref 8.9–10.3)
Chloride: 97 mmol/L — ABNORMAL LOW (ref 98–111)
Creatinine, Ser: 1.43 mg/dL — ABNORMAL HIGH (ref 0.44–1.00)
GFR, Estimated: 37 mL/min — ABNORMAL LOW (ref >60)
Glucose, Bld: 310 mg/dL — ABNORMAL HIGH (ref 70–99)
Potassium: 3.8 mmol/L (ref 3.5–5.1)
Sodium: 133 mmol/L — ABNORMAL LOW (ref 135–145)

## 2023-01-15 LAB — POCT I-STAT EG7
Acid-Base Excess: 3 mmol/L — ABNORMAL HIGH (ref 0.0–2.0)
Bicarbonate: 28.1 mmol/L — ABNORMAL HIGH (ref 20.0–28.0)
Calcium, Ion: 1.22 mmol/L (ref 1.15–1.40)
HCT: 28 % — ABNORMAL LOW (ref 36.0–46.0)
Hemoglobin: 9.5 g/dL — ABNORMAL LOW (ref 12.0–15.0)
O2 Saturation: 54 %
Potassium: 4 mmol/L (ref 3.5–5.1)
Sodium: 138 mmol/L (ref 135–145)
TCO2: 29 mmol/L (ref 22–32)
pCO2, Ven: 46.4 mmHg (ref 44–60)
pH, Ven: 7.39 (ref 7.25–7.43)
pO2, Ven: 29 mmHg — CL (ref 32–45)

## 2023-01-15 LAB — CBC WITH DIFFERENTIAL/PLATELET
Abs Immature Granulocytes: 0.07 10*3/uL (ref 0.00–0.07)
Basophils Absolute: 0.1 10*3/uL (ref 0.0–0.1)
Basophils Relative: 1 %
Eosinophils Absolute: 0.6 10*3/uL — ABNORMAL HIGH (ref 0.0–0.5)
Eosinophils Relative: 6 %
HCT: 29.2 % — ABNORMAL LOW (ref 36.0–46.0)
Hemoglobin: 9.3 g/dL — ABNORMAL LOW (ref 12.0–15.0)
Immature Granulocytes: 1 %
Lymphocytes Relative: 9 %
Lymphs Abs: 0.9 10*3/uL (ref 0.7–4.0)
MCH: 29.8 pg (ref 26.0–34.0)
MCHC: 31.8 g/dL (ref 30.0–36.0)
MCV: 93.6 fL (ref 80.0–100.0)
Monocytes Absolute: 1.4 10*3/uL — ABNORMAL HIGH (ref 0.1–1.0)
Monocytes Relative: 13 %
Neutro Abs: 7.4 10*3/uL (ref 1.7–7.7)
Neutrophils Relative %: 70 %
Platelets: 256 10*3/uL (ref 150–400)
RBC: 3.12 MIL/uL — ABNORMAL LOW (ref 3.87–5.11)
RDW: 15.1 % (ref 11.5–15.5)
WBC: 10.4 10*3/uL (ref 4.0–10.5)
nRBC: 0 % (ref 0.0–0.2)

## 2023-01-15 LAB — BPAM FFP
Blood Product Expiration Date: 202402052359
Blood Product Expiration Date: 202402052359
Blood Product Expiration Date: 202402052359
Blood Product Expiration Date: 202402052359
ISSUE DATE / TIME: 202402041104
ISSUE DATE / TIME: 202402041104
ISSUE DATE / TIME: 202402041104
ISSUE DATE / TIME: 202402041104
Unit Type and Rh: 6200
Unit Type and Rh: 6200
Unit Type and Rh: 6200
Unit Type and Rh: 6200

## 2023-01-15 LAB — COOXEMETRY PANEL
Carboxyhemoglobin: 1.4 % (ref 0.5–1.5)
Methemoglobin: <0.7 % (ref 0.0–1.5)
O2 Saturation: 68.2 %
Total hemoglobin: 8.7 g/dL — ABNORMAL LOW (ref 12.0–16.0)

## 2023-01-15 LAB — GLUCOSE, CAPILLARY
Glucose-Capillary: 149 mg/dL — ABNORMAL HIGH (ref 70–99)
Glucose-Capillary: 159 mg/dL — ABNORMAL HIGH (ref 70–99)
Glucose-Capillary: 151 mg/dL — ABNORMAL HIGH (ref 70–99)
Glucose-Capillary: 87 mg/dL (ref 70–99)

## 2023-01-15 LAB — PREPARE RBC (CROSSMATCH)

## 2023-01-15 LAB — MAGNESIUM: Magnesium: 2.3 mg/dL (ref 1.7–2.4)

## 2023-01-15 SURGERY — GROIN EXPOSURE
Anesthesia: General | Site: Groin | Laterality: Right

## 2023-01-15 MED ORDER — PROPOFOL 10 MG/ML IV BOLUS
INTRAVENOUS | Status: DC | PRN
  Administered 2023-01-15: 70 mg via INTRAVENOUS

## 2023-01-15 MED ORDER — HEPARIN 6000 UNIT IRRIGATION SOLUTION
Status: DC | PRN
  Administered 2023-01-15: 1

## 2023-01-15 MED ORDER — FENTANYL CITRATE PF 50 MCG/ML IJ SOSY: 100.0000 ug | PREFILLED_SYRINGE | Freq: Once | INTRAMUSCULAR | Status: DC

## 2023-01-15 MED ORDER — 0.9 % SODIUM CHLORIDE (POUR BTL) OPTIME
TOPICAL | Status: DC | PRN
  Administered 2023-01-15: 2000 mL

## 2023-01-15 MED ORDER — FENTANYL CITRATE PF 50 MCG/ML IJ SOSY
PREFILLED_SYRINGE | INTRAMUSCULAR | Status: AC
  Filled 2023-01-15: qty 1

## 2023-01-15 MED ORDER — ROCURONIUM BROMIDE 10 MG/ML (PF) SYRINGE
PREFILLED_SYRINGE | INTRAVENOUS | Status: DC | PRN
  Administered 2023-01-15: 10 mg via INTRAVENOUS
  Administered 2023-01-15: 50 mg via INTRAVENOUS

## 2023-01-15 MED ORDER — SUCCINYLCHOLINE CHLORIDE 200 MG/10ML IV SOSY
PREFILLED_SYRINGE | INTRAVENOUS | Status: DC | PRN
  Administered 2023-01-15: 100 mg via INTRAVENOUS

## 2023-01-15 MED ORDER — FENTANYL CITRATE PF 50 MCG/ML IJ SOSY: 25.0000 ug | PREFILLED_SYRINGE | Freq: Once | INTRAMUSCULAR | Status: DC

## 2023-01-15 MED ORDER — LIDOCAINE 2% (20 MG/ML) 5 ML SYRINGE
INTRAMUSCULAR | Status: DC | PRN
  Administered 2023-01-15: 100 mg via INTRAVENOUS

## 2023-01-15 MED ORDER — PHENYLEPHRINE HCL-NACL 20-0.9 MG/250ML-% IV SOLN
INTRAVENOUS | Status: DC | PRN
  Administered 2023-01-15: 50 ug/min via INTRAVENOUS

## 2023-01-15 MED ORDER — OXYCODONE HCL 5 MG PO TABS
5.0000 mg | ORAL_TABLET | Freq: Four times a day (QID) | ORAL | Status: DC | PRN
  Administered 2023-01-15 – 2023-02-13 (×14): 5 mg via ORAL
  Filled 2023-01-15 (×20): qty 1

## 2023-01-15 MED ORDER — FENTANYL CITRATE PF 50 MCG/ML IJ SOSY: 25.0000 ug | PREFILLED_SYRINGE | Freq: Once | INTRAMUSCULAR | Status: AC

## 2023-01-15 MED ORDER — FENTANYL CITRATE PF 50 MCG/ML IJ SOSY
25.0000 ug | PREFILLED_SYRINGE | Freq: Once | INTRAMUSCULAR | Status: AC
  Administered 2023-01-15: 25 ug via INTRAVENOUS

## 2023-01-15 MED ORDER — SUGAMMADEX SODIUM 200 MG/2ML IV SOLN
INTRAVENOUS | Status: DC | PRN
  Administered 2023-01-15: 200 mg via INTRAVENOUS

## 2023-01-15 MED ORDER — FENTANYL CITRATE PF 50 MCG/ML IJ SOSY
PREFILLED_SYRINGE | INTRAMUSCULAR | Status: AC
  Filled 2023-01-15: qty 2

## 2023-01-15 MED ORDER — ALBUMIN HUMAN 5 % IV SOLN: INTRAVENOUS | Status: DC | PRN

## 2023-01-15 MED ORDER — ACETAMINOPHEN 325 MG PO TABS
650.0000 mg | ORAL_TABLET | Freq: Four times a day (QID) | ORAL | Status: DC
  Administered 2023-01-15 – 2023-02-14 (×109): 650 mg via ORAL
  Filled 2023-01-15 (×114): qty 2

## 2023-01-15 MED ORDER — LACTATED RINGERS IV SOLN: INTRAVENOUS | Status: DC | PRN

## 2023-01-15 MED ORDER — CEFAZOLIN SODIUM-DEXTROSE 2-3 GM-%(50ML) IV SOLR
INTRAVENOUS | Status: DC | PRN
  Administered 2023-01-15: 2 g via INTRAVENOUS

## 2023-01-15 MED ORDER — SODIUM CHLORIDE 0.9% IV SOLUTION: Freq: Once | INTRAVENOUS | Status: AC

## 2023-01-15 SURGICAL SUPPLY — 25 items
ADH SKN CLS APL DERMABOND .7 (GAUZE/BANDAGES/DRESSINGS) ×1
CANISTER PREVENA PLUS 150 (CANNISTER) IMPLANT
CANISTER SUCT 3000ML PPV (MISCELLANEOUS) ×1 IMPLANT
DERMABOND ADVANCED .7 DNX12 (GAUZE/BANDAGES/DRESSINGS) ×1 IMPLANT
DRAPE INCISE IOBAN 66X45 STRL (DRAPES) IMPLANT
GLOVE BIOGEL PI IND STRL 8 (GLOVE) ×1 IMPLANT
GOWN STRL REUS W/ TWL LRG LVL3 (GOWN DISPOSABLE) ×2 IMPLANT
GOWN STRL REUS W/TWL 2XL LVL3 (GOWN DISPOSABLE) ×2 IMPLANT
GOWN STRL REUS W/TWL LRG LVL3 (GOWN DISPOSABLE) ×2
KIT BASIN OR (CUSTOM PROCEDURE TRAY) ×1 IMPLANT
KIT PREVENA INCISION MGT 13 (CANNISTER) IMPLANT
KIT TURNOVER KIT B (KITS) ×1 IMPLANT
NS IRRIG 1000ML POUR BTL (IV SOLUTION) ×1 IMPLANT
PACK PERIPHERAL VASCULAR (CUSTOM PROCEDURE TRAY) ×1 IMPLANT
PAD ARMBOARD 7.5X6 YLW CONV (MISCELLANEOUS) ×1 IMPLANT
POWDER MYRIAD MORCELLS 1000MG (Miscellaneous) IMPLANT
SUT MNCRL AB 4-0 PS2 18 (SUTURE) ×1 IMPLANT
SUT PROLENE 5 0 C 1 24 (SUTURE) ×1 IMPLANT
SUT PROLENE 6 0 BV (SUTURE) ×1 IMPLANT
SUT VIC AB 2-0 CT1 27 (SUTURE) ×1
SUT VIC AB 2-0 CT1 TAPERPNT 27 (SUTURE) ×1 IMPLANT
SUT VIC AB 3-0 SH 27 (SUTURE) ×1
SUT VIC AB 3-0 SH 27X BRD (SUTURE) ×1 IMPLANT
TOWEL GREEN STERILE (TOWEL DISPOSABLE) ×1 IMPLANT
WATER STERILE IRR 1000ML POUR (IV SOLUTION) ×1 IMPLANT

## 2023-01-15 NOTE — Anesthesia Procedure Notes (Signed)
Procedure Name: Intubation Date/Time: 01/15/2023 11:18 AM  Performed by: Georgia Duff, CRNAPre-anesthesia Checklist: Patient identified, Emergency Drugs available, Suction available and Patient being monitored Patient Re-evaluated:Patient Re-evaluated prior to induction Oxygen Delivery Method: Circle System Utilized Preoxygenation: Pre-oxygenation with 100% oxygen Induction Type: IV induction Ventilation: Mask ventilation without difficulty Laryngoscope Size: Miller and 2 Grade View: Grade I Tube type: Oral Number of attempts: 1 Airway Equipment and Method: Stylet and Oral airway Placement Confirmation: ETT inserted through vocal cords under direct vision, positive ETCO2 and breath sounds checked- equal and bilateral Secured at: 21 cm Tube secured with: Tape Dental Injury: Teeth and Oropharynx as per pre-operative assessment

## 2023-01-15 NOTE — Progress Notes (Signed)
2956 - Dr Haroldine Laws notified of increasing right groin pain.    0930 - Dr Haroldine Laws at bedside.  Manual pressure placed by MD x 30 minutes.  Orders received.    1010:  Dr Virl Cagey and Bensimhon at bedside with [place] tech.  Manual pressure remains being applied after completion of study until pt leaving for OR at 1110.    1110:  Pt transferred to OR while manual pressure being applied to right groin.    Total time of manual pressure:  0930-1110.

## 2023-01-15 NOTE — Progress Notes (Signed)
  Daily Progress Note  S/p: Right groin pseudoaneurysm thrombin injection  Subjective: Patient moved this morning complaining of significant pain in the right groin.  Hematoma present.  Ultrasound demonstrates reopening of previous pseudoaneurysm.  Patient with hemorrhage.  Pressure held.   ASSESSMENT/PLAN:  Patient needs urgent OR to address bleeding pseudoaneurysm.  I spoke to the patient's son and husband.  Being that the pseudoaneurysm injection failed, repeating the pseudoaneurysm injection would not be a suboptimal therapy.  After discussing the risk and benefits of right groin exploration for femoral artery repair, Natasha Woodard and her family elected to proceed.   Cassandria Santee MD MS [facility] [phone] 01/15/2023  10:32 AM

## 2023-01-15 NOTE — Progress Notes (Signed)
VASCULAR LAB    Right groin ultrasound has been performed.  See CV proc for preliminary results.   Mathilda Maguire, RVT 01/15/2023, 10:21 AM

## 2023-01-15 NOTE — Progress Notes (Signed)
Per Dr Virl Cagey - Bedrest tonight.  May me up to [facility] starting in 2/5 am.

## 2023-01-15 NOTE — Anesthesia Preprocedure Evaluation (Addendum)
Anesthesia Evaluation  Patient identified by MRN, date of birth, ID band Patient awake    Reviewed: Allergy & Precautions, NPO status , Patient's Chart, lab work & pertinent test results  Airway Mallampati: II  TM Distance: >3 FB Neck ROM: Full    Dental  (+) Teeth Intact, Dental Advisory Given   Pulmonary sleep apnea , former smoker   breath sounds clear to auscultation       Cardiovascular hypertension, Pt. on home beta blockers and Pt. on medications + CAD, + Past MI, + Peripheral Vascular Disease and +CHF  + dysrhythmias  Rhythm:Regular Rate:Normal  Echo:  1. Akinesis of the apical to mid lateral wall and apex. All other  segments are hypokinetic. Left ventricular ejection fraction, by  estimation, is 20 to 25%. Left ventricular ejection fraction by 2D MOD  biplane is 24.0 %. The left ventricle has severely  decreased function. The left ventricle demonstrates regional wall motion  abnormalities (see scoring diagram/findings for description). The left  ventricular internal cavity size was mildly dilated. Left ventricular  diastolic function could not be  evaluated.   2. Right ventricular systolic function is mildly reduced. The right  ventricular size is mildly enlarged.   3. Left atrial size was mild to moderately dilated.   4. Severe eccentric posteriorly directed mitral regurgitation. Given  lateral WMA, suspect this is ischemic MR (IIIB) due to restricted PMVL.  The mitral valve is grossly normal. Severe mitral valve regurgitation. No  evidence of mitral stenosis.   5. The aortic valve is grossly normal. Aortic valve regurgitation is not  visualized.   6. The inferior vena cava is normal in size with <50% respiratory  variability, suggesting right atrial pressure of 8 mmHg.      Neuro/Psych  Neuromuscular disease  negative psych ROS   GI/Hepatic Neg liver ROS, hiatal hernia,GERD  ,,  Endo/Other  diabetes, Type 2,  Oral Hypoglycemic Agents    Renal/GU Renal disease     Musculoskeletal   Abdominal   Peds  Hematology   Anesthesia Other Findings   Reproductive/Obstetrics                             Anesthesia Physical Anesthesia Plan  ASA: 4 and emergent  Anesthesia Plan: General   Post-op Pain Management: Tylenol PO (pre-op)*   Induction: Intravenous, Rapid sequence and Cricoid pressure planned  PONV Risk Score and Plan: 4 or greater and Ondansetron and Treatment may vary due to age or medical condition  Airway Management Planned: Oral ETT  Additional Equipment: None  Intra-op Plan:   Post-operative Plan: Extubation in OR  Informed Consent: I have reviewed the patients History and Physical, chart, labs and discussed the procedure including the risks, benefits and alternatives for the proposed anesthesia with the patient or authorized representative who has indicated his/her understanding and acceptance.     Dental advisory given  Plan Discussed with: CRNA  Anesthesia Plan Comments:        Anesthesia Quick Evaluation

## 2023-01-15 NOTE — Op Note (Signed)
    NAME: Natasha Woodard    MRN: [id number] DOB: [birth year]    DATE OF OPERATION: 01/15/2023  PREOP DIAGNOSIS:    Right femoral pseudoaneurysm rupture  POSTOP DIAGNOSIS:    Same  PROCEDURE:    Right groin exploration Hematoma evacuation Right profunda femoris primary repair Myriad more cell placement, 2g Vacuum-assisted closure-Prevena VAC placement  SURGEON: Broadus John  ASSIST: Paulo Fruit, PA  ANESTHESIA: General  EBL: 300 mL  INDICATIONS:    Natasha Woodard is a 82 y.o. female status post cardiac catheter NSTEMI complicated by femoral pseudoaneurysm.  This was injected with thrombin last week.  The patient was doing well until this morning when she appreciated swelling in the right groin.  A significant hematoma developed rapidly, extending down the leg.  Pressure was held, and vascular surgery was called.  I had a risk-benefit discussion with family regarding the need for operative intervention, primary repair of the arteriotomy.  After discussing the risk and benefits, care elected to proceed.  FINDINGS:   Arteriotomy appreciated in the profunda femoris artery  TECHNIQUE:   Patient is brought to the OR laid in supine position.  General anesthesia was induced and patient was prepped and draped in standard fashion.  The case began with ultrasound insonation of the right common femoral artery, followed by oblique incision in the groin.  The common femoral artery was exposed and controlled with the use of vessel loop.  Next, there was a lateral hematoma which was investigated.  This demonstrated significant bleeding and with further dissection and arteriotomy was appreciated in the proximal portion of the profunda femoris artery.  Digital pressure was used to control the bleeding while the superficial femoral artery was looped.  The common femoral artery was clamped to decrease blood loss for primary repair of the profunda femoris artery.  A 5-0 Prolene suture was used  and the 6 [place] arteriotomy was closed.  Next, the wound bed was irrigated, a a significant hematoma, roughly the size of a baseball was removed from the right thigh.  Next, the wound bed was irrigated with copious amounts of saline.  Due to the size of the defect, I elected to use myriad more cell powder, 2 g in an effort to help with wound healing.  Once in place, the groin defect was closed using 2-0 Vicryl suture in running fashion.  I elected to use staples at the skin with a Prevena wound VAC as the dermis was poor quality.  The wound VAC will remain in place for 7 days.  Natasha Woodard and her family are aware she is at high risk of wound breakdown due to the large defect in the right groin.   [name], MD [facility] DATE OF DICTATION:   01/15/2023

## 2023-01-15 NOTE — Progress Notes (Signed)
Advanced Heart Failure Rounding Note  PCP-Cardiologist: Dr. Ellyn Hack AHF: Dr. 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 Now admitted for acute lateral wall MI 2/2 distal LCx occlusion c/b severe ischemic MR and acute systolic heart failure w/ low output, CI 2.1 on RHC. Required placement of IABP, but later removed given concerns for developing limb ischemia=>found to have Right femoral PSA   Subjective:   1/31 Underwent thrombin injection to right femoral PSA . Hgb drifting down 10.3>8.3>6.8  2/1 Started on amio drip to suppress PVCs. Got dose of IV lasix x1. CVP low.   Developed recurrent pain and swelling in R groin this am.  Had some AF overnight again   Denies CP or SOB   Objective:   Weight Range: 78.3 kg Body mass index is 31.57 kg/m.   Vital Signs:   Temp:  [97.6 F (36.4 C)-98.8 F (37.1 C)] 98.8 F (37.1 C) (02/04 0758) Pulse Rate:  [65-98] 83 (02/04 0715) Resp:  [13-29] 14 (02/04 0715) BP: (69-132)/(47-105) 132/59 (02/04 0700) SpO2:  [92 %-100 %] 93 % (02/04 0715) Weight:  [78.3 kg] 78.3 kg (02/04 0500) Last BM Date : 01/13/23  Weight change: Filed Weights   01/13/23 0500 01/14/23 0600 01/15/23 0500  Weight: 71.6 kg 75 kg 78.3 kg    Intake/Output:   Intake/Output Summary (Last 24 hours) at 01/15/2023 1046 Last data filed at 01/15/2023 1000 Gross per 24 hour  Intake 1040.44 ml  Output 2640 ml  Net -1599.56 ml      Physical Exam    General:  Lying in bed  No resp difficulty HEENT: normal Neck: supple. no JVD. Carotids 2+ bilat; no bruits. No lymphadenopathy or thryomegaly appreciated. Cor: PMI nondisplaced. Irregular rate & rhythm.2/6 MR Lungs: clear Abdomen: soft, nontender, nondistended. No hepatosplenomegaly. No bruits or masses. Good bowel sounds. Extremities: no cyanosis, clubbing, rash, edema large hematoma in right groin. Very tender to palpation no  bruit Neuro: alert & orientedx3, cranial nerves grossly intact. moves all 4 extremities w/o difficulty. Affect pleasant   Telemetry   Sinus with PACs with occasional PACs 80s Personally reviewed  Labs    CBC Recent Labs    01/15/23 0457 01/15/23 0950 01/15/23 0952  WBC 8.0 10.4  --   NEUTROABS  --  7.4  --   HGB 8.6* 9.3* 9.5*  HCT 27.3* 29.2* 28.0*  MCV 93.5 93.6  --   PLT 234 256  --     Basic Metabolic Panel Recent Labs    01/14/23 0425 01/15/23 0457 01/15/23 0952  NA 129* 133* 138  K 3.8 3.8 4.0  CL 96* 97*  --   CO2 24 27  --   GLUCOSE 343* 310*  --   BUN 25* 20  --   CREATININE 1.66* 1.43*  --   CALCIUM 7.6* 7.7*  --   MG 2.4 2.3  --     Liver Function Tests No results for input(s): "AST", "ALT", "ALKPHOS", "BILITOT", "PROT", "ALBUMIN" in the last 72 hours.  No results for input(s): "LIPASE", "AMYLASE" in the last 72 hours. Cardiac Enzymes No results for input(s): "CKTOTAL", "CKMB", "CKMBINDEX", "TROPONINI" in the last 72 hours.  BNP: BNP (last 3 results) Recent Labs    06/17/22 1000 08/31/22 1613 01/10/23 1048  BNP 1,397.5* 1,178.5* >4,500.0*     ProBNP (last 3 results) Recent Labs    06/09/22 1506  PROBNP 7,062*      D-Dimer No results for input(s): "DDIMER" in the last 72 hours. Hemoglobin A1C No results for input(s): "HGBA1C" in the last 72 hours. Fasting Lipid Panel No results for input(s): "CHOL", "HDL", "LDLCALC", "TRIG", "CHOLHDL", "LDLDIRECT" in the last 72 hours.  Thyroid Function Tests No results for input(s): "TSH", "T4TOTAL", "T3FREE", "THYROIDAB" in the last 72 hours.  Invalid input(s): "FREET3"  Other results:   Imaging    No results found.   Medications:     Scheduled Medications:  aspirin  81 mg Oral Daily   Chlorhexidine Gluconate Cloth  6 each Topical Daily   empagliflozin  10 mg Oral Daily   fentaNYL       fentaNYL       fentaNYL (SUBLIMAZE) injection  25 mcg Intravenous Once   fentaNYL  (SUBLIMAZE) injection  25 mcg Intravenous Once   insulin aspart  0-15 Units Subcutaneous TID WC    morphine injection  2 mg Intravenous Once   rosuvastatin  10 mg Oral QHS   sodium chloride flush  10-40 mL Intracatheter Q12H   sodium chloride flush  3 mL Intravenous Q12H   ticagrelor  90 mg Oral BID    Infusions:  sodium chloride     sodium chloride     sodium chloride Stopped (01/13/23 1131)   amiodarone 30 mg/hr (01/15/23 0808)   milrinone 0.25 mcg/kg/min (01/15/23 0600)   norepinephrine (LEVOPHED) Adult infusion 8 mcg/min (01/15/23 0600)    PRN Medications: sodium chloride, acetaminophen, fentaNYL, fentaNYL, ipratropium-albuterol, nitroGLYCERIN, ondansetron (ZOFRAN) IV, mouth rinse, sodium chloride flush, sodium chloride flush, traMADol    Assessment/Plan   1. Cardiogenic shock/acute on chronic systolic CHF: Echo this admission with EF 20-25%, mild LV dilation, mild RV dysfunction, severe MR with restricted posterior leaflet.  Echo in 2/23 with EF 50-55%.  Patient has history of nonischemic cardiomyopathy (?PVC-mediated) that improved.  She reports 2 months of dyspnea then had lateral STEMI 1/30 with chest pain.  Per Dr McLean--> He did not  think that her PLOM occlusion can explain the extent of her cardiomyopathy, so suspect EF may have been falling prior to this event (symptomatic at least 2 months).  Suspect mixed ischemic/nonischemic cardiomyopathy.  Infarct-related MR also plays a role (see below).  She initially had IABP for cardiogenic shock, now removed due to right leg ischemia. Lactic acid was elevated at 4.1>1. - Remains on milrinone 0.25 + NE 8 mcg. CO-OX 68%  - Will wean inotropes starting tomorrow once stable from OR -No GDMT while on pressors 2. Mitral regurgitation: severe MR with restricted posterior leaflet, suspect infarct-related MR with PLOM occlusion.  - Continue milrinone - Plan TEE this week  when more stable 3. CAD: Lateral STEMI with occluded pLOM,  treated with DES.   - No s/s angina - Continue ASA 81 - Continue ticagrelor - Continue statin.  4. 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 On milrinone, has had frequent PVCs and NSVT.  - continue amio  5. Right femoral PSA: s/p thrombin injection by Dr. Carlis Abbott.  - stat u/s with recurrent PSA with active bleeding into PSA - I held pressure for > 30 mins - VVS consulted -> To OR for operative repair 6. AKI on CKD stage 3b - Baseline 1.3-1.5 Peak 1.84 in setting of ATN  - Scr 1.4 - Resume Jardiance at some point  7. Acute blood loss anemia: Hgb down to 6.8 -> 2u -> 9.2 -> 8.6 -> 9.5, -  will follow  8. Acute Hypoxic Respiratory Failure - resolving. Now off bipap 9. PAF - in out of NSR on IV amio  - no AC until groin stable  CRITICAL CARE Performed by: Glori Bickers  Total critical care time: 60 minutes  Critical care time was exclusive of separately billable procedures and treating other patients.  Critical care was necessary to treat or prevent imminent or life-threatening deterioration.  Critical care was time spent personally by me (independent of midlevel providers or residents) on the following activities: development of treatment plan with patient and/or surrogate as well as nursing, discussions with consultants, evaluation of patient's response to treatment, examination of patient, obtaining history from patient or surrogate, ordering and performing treatments and interventions, ordering and review of laboratory studies, ordering and review of radiographic studies, pulse oximetry and re-evaluation of patient's condition.   Glori Bickers MD 01/15/2023 10:46 AM

## 2023-01-15 NOTE — Anesthesia Postprocedure Evaluation (Signed)
Anesthesia Post Note  Patient: Natasha Woodard  Procedure(s) Performed: RIGHT GROIN EXPOSURE (Right: Groin) APPLICATION OF INCISIONAL WOUND VAC (Right: Groin) EVACUATION HEMATOMA RIGHT GROIN (Right: Groin) RIGHT PROFUNDA ARTERY REPAIR WITH 2000G MYRIAD MORCELLS (Right: Groin)     Patient location during evaluation: PACU Anesthesia Type: General Level of consciousness: awake and alert Pain management: pain level controlled Vital Signs Assessment: post-procedure vital signs reviewed and stable Respiratory status: spontaneous breathing, nonlabored ventilation, respiratory function stable and patient connected to nasal cannula oxygen Cardiovascular status: blood pressure returned to baseline and stable Postop Assessment: no apparent nausea or vomiting Anesthetic complications: no  No notable events documented.  Last Vitals:  Vitals:   01/15/23 1345 01/15/23 1539  BP: (!) 147/69   Pulse:    Resp: 17   Temp:  36.7 C  SpO2:      Last Pain:  Vitals:   01/15/23 1540  TempSrc:   PainSc: 2                  Effie Berkshire

## 2023-01-15 NOTE — Transfer of Care (Signed)
Immediate Anesthesia Transfer of Care Note  Patient: Natasha Woodard  Procedure(s) Performed: Virl Son EXPOSURE (Right) APPLICATION OF INCISIONAL WOUND VAC (Right: Groin) EVACUATION HEMATOMA RIGHT GROIN (Right: Groin) RIGHT PROFUNDA ARTERY REPAIR WITH 2000G MYRIAD MORCELLS (Right: Groin)  Patient Location: PACU  Anesthesia Type:General  Level of Consciousness: drowsy and patient cooperative  Airway & Oxygen Therapy: Patient Spontanous Breathing  Post-op Assessment: Report given to RN and Post -op Vital signs reviewed and stable  Post vital signs: Reviewed and stable  Last Vitals:  Vitals Value Taken Time  BP 128/67 01/15/23 1234  Temp 36.6 C 01/15/23 1234  Pulse 80 01/15/23 1240  Resp 14 01/15/23 1240  SpO2 92 % 01/15/23 1240  Vitals shown include unvalidated device data.  Last Pain:  Vitals:   01/15/23 0758  TempSrc: Oral  PainSc: 8       Patients Stated Pain Goal: 0 (40/37/09 6438)  Complications: No notable events documented.

## 2023-01-16 ENCOUNTER — Telehealth (HOSPITAL_COMMUNITY): Payer: Self-pay

## 2023-01-16 ENCOUNTER — Encounter (HOSPITAL_COMMUNITY): Payer: Self-pay | Admitting: Vascular Surgery

## 2023-01-16 ENCOUNTER — Other Ambulatory Visit (HOSPITAL_COMMUNITY): Payer: Self-pay

## 2023-01-16 DIAGNOSIS — R570 Cardiogenic shock: Secondary | ICD-10-CM | POA: Diagnosis not present

## 2023-01-16 DIAGNOSIS — I2510 Atherosclerotic heart disease of native coronary artery without angina pectoris: Secondary | ICD-10-CM | POA: Diagnosis not present

## 2023-01-16 DIAGNOSIS — I214 Non-ST elevation (NSTEMI) myocardial infarction: Secondary | ICD-10-CM | POA: Diagnosis not present

## 2023-01-16 DIAGNOSIS — I5023 Acute on chronic systolic (congestive) heart failure: Secondary | ICD-10-CM | POA: Diagnosis not present

## 2023-01-16 LAB — BASIC METABOLIC PANEL
Anion gap: 11 (ref 5–15)
BUN: 16 mg/dL (ref 8–23)
CO2: 23 mmol/L (ref 22–32)
Calcium: 8 mg/dL — ABNORMAL LOW (ref 8.9–10.3)
Chloride: 99 mmol/L (ref 98–111)
Creatinine, Ser: 1.34 mg/dL — ABNORMAL HIGH (ref 0.44–1.00)
GFR, Estimated: 40 mL/min — ABNORMAL LOW (ref >60)
Glucose, Bld: 229 mg/dL — ABNORMAL HIGH (ref 70–99)
Potassium: 4 mmol/L (ref 3.5–5.1)
Sodium: 133 mmol/L — ABNORMAL LOW (ref 135–145)

## 2023-01-16 LAB — GLUCOSE, CAPILLARY
Glucose-Capillary: 110 mg/dL — ABNORMAL HIGH (ref 70–99)
Glucose-Capillary: 187 mg/dL — ABNORMAL HIGH (ref 70–99)
Glucose-Capillary: 140 mg/dL — ABNORMAL HIGH (ref 70–99)

## 2023-01-16 LAB — CBC
HCT: 26.6 % — ABNORMAL LOW (ref 36.0–46.0)
Hemoglobin: 8.4 g/dL — ABNORMAL LOW (ref 12.0–15.0)
MCH: 29.6 pg (ref 26.0–34.0)
MCHC: 31.6 g/dL (ref 30.0–36.0)
MCV: 93.7 fL (ref 80.0–100.0)
Platelets: 218 10*3/uL (ref 150–400)
RBC: 2.84 MIL/uL — ABNORMAL LOW (ref 3.87–5.11)
RDW: 14.9 % (ref 11.5–15.5)
WBC: 13.2 10*3/uL — ABNORMAL HIGH (ref 4.0–10.5)
nRBC: 0 % (ref 0.0–0.2)

## 2023-01-16 LAB — COOXEMETRY PANEL
Carboxyhemoglobin: 2.2 % — ABNORMAL HIGH (ref 0.5–1.5)
Methemoglobin: <0.7 % (ref 0.0–1.5)
O2 Saturation: 76.6 %
Total hemoglobin: 8.6 g/dL — ABNORMAL LOW (ref 12.0–16.0)

## 2023-01-16 LAB — TYPE AND SCREEN
ABO/RH(D): O POS
Antibody Screen: NEGATIVE

## 2023-01-16 LAB — MAGNESIUM: Magnesium: 2 mg/dL (ref 1.7–2.4)

## 2023-01-16 MED ORDER — FUROSEMIDE 10 MG/ML IJ SOLN
40.0000 mg | Freq: Once | INTRAMUSCULAR | Status: AC
  Administered 2023-01-16: 40 mg via INTRAVENOUS
  Filled 2023-01-16: qty 4

## 2023-01-16 MED ORDER — MIDODRINE HCL 5 MG PO TABS
5.0000 mg | ORAL_TABLET | Freq: Three times a day (TID) | ORAL | Status: DC
  Administered 2023-01-16: 5 mg via ORAL
  Filled 2023-01-16: qty 1

## 2023-01-16 NOTE — Progress Notes (Addendum)
Advanced Heart Failure Rounding Note  PCP-Cardiologist: Dr. Ellyn Hack AHF: Dr. 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 Now admitted for acute lateral wall MI 2/2 distal LCx occlusion c/b severe ischemic MR and acute systolic heart failure w/ low output, CI 2.1 on RHC. Required placement of IABP, but later removed given concerns for developing limb ischemia=>found to have Right femoral PSA   Subjective:   1/31 Underwent thrombin injection to right femoral PSA . Hgb drifting down 10.3>8.3>6.8  2/1 Started on amio drip to suppress PVCs. Got dose of IV lasix x1. CVP low.  2/4 Ruptured PSA. Returned to OR for repair. VAC in place   Remains on Norepi 4 + Milrinone 0.25 mcg + amio 30.   Feels the best she has felt. Wants to get out of bed.    Objective:   Weight Range: 77.9 kg Body mass index is 31.41 kg/m.   Vital Signs:   Temp:  [97.9 F (36.6 C)-100.2 F (37.9 C)] 100.2 F (37.9 C) (02/05 0330) Pulse Rate:  [77-96] 83 (02/05 0700) Resp:  [11-59] 22 (02/05 0700) BP: (88-156)/(41-91) 106/43 (02/05 0700) SpO2:  [85 %-98 %] 93 % (02/05 0700) Weight:  [77.9 kg] 77.9 kg (02/05 0435) Last BM Date : 01/15/23  Weight change: Filed Weights   01/14/23 0600 01/15/23 0500 01/16/23 0435  Weight: 75 kg 78.3 kg 77.9 kg    Intake/Output:   Intake/Output Summary (Last 24 hours) at 01/16/2023 0723 Last data filed at 01/16/2023 0600 Gross per 24 hour  Intake 1400.49 ml  Output 1825 ml  Net -424.51 ml     Physical Exam   CVP 9-10  General:   No resp difficulty HEENT: normal Neck: supple. JVP 9-10 . Carotids 2+ bilat; no bruits. No lymphadenopathy or thryomegaly appreciated. Cor: PMI nondisplaced. Irregular rate & rhythm. No rubs, gallops . 2/6 MR . Lungs: clear Abdomen: soft, nontender, nondistended. No hepatosplenomegaly. No bruits or masses. Good bowel sounds. Extremities: no cyanosis,  clubbing, rash, edema. R groin prevena VAC. Remains tender R upper thigh Neuro: alert & orientedx3, cranial nerves grossly intact. moves all 4 extremities w/o difficulty. Affect pleasant  Telemetry   A Fib 80-90s   Labs    CBC Recent Labs    01/15/23 0950 01/15/23 0952 01/16/23 0418  WBC 10.4  --  13.2*  NEUTROABS 7.4  --   --   HGB 9.3* 9.5* 8.4*  HCT 29.2* 28.0* 26.6*  MCV 93.6  --  93.7  PLT 256  --  956   Basic Metabolic Panel Recent Labs    01/15/23 0457 01/15/23 0952 01/16/23 0418  NA 133* 138 133*  K 3.8 4.0 4.0  CL 97*  --  99  CO2 27  --  23  GLUCOSE 310*  --  229*  BUN 20  --  16  CREATININE 1.43*  --  1.34*  CALCIUM 7.7*  --  8.0*  MG 2.3  --  2.0   Liver Function Tests No results for input(s): "AST", "ALT", "ALKPHOS", "BILITOT", "PROT", "ALBUMIN" in the last 72 hours.  No results for input(s): "LIPASE", "AMYLASE" in the last 72 hours. Cardiac Enzymes No results for input(s): "CKTOTAL", "CKMB", "CKMBINDEX", "TROPONINI" in the last 72 hours.  BNP: BNP (last 3 results) Recent Labs    06/17/22 1000 08/31/22 1613 01/10/23 1048  BNP 1,397.5* 1,178.5* >4,500.0*    ProBNP (  last 3 results) Recent Labs    06/09/22 1506  PROBNP 4,638*     D-Dimer No results for input(s): "DDIMER" in the last 72 hours. Hemoglobin A1C No results for input(s): "HGBA1C" in the last 72 hours. Fasting Lipid Panel No results for input(s): "CHOL", "HDL", "LDLCALC", "TRIG", "CHOLHDL", "LDLDIRECT" in the last 72 hours.  Thyroid Function Tests No results for input(s): "TSH", "T4TOTAL", "T3FREE", "THYROIDAB" in the last 72 hours.  Invalid input(s): "FREET3"  Other results:   Imaging    VAS [place] GROIN PSEUDOANEURYSM  Result Date: 01/15/2023  ARTERIAL PSEUDOANEURYSM  Patient Name:  JOWANDA HEEG  Date of Exam:   01/15/2023 Medical Rec #: [id number]          Accession #:    [id number] Date of Birth: [birth year]          Patient Gender: F Patient Age:   89 years Exam  Location:  [facility] Procedure:      VAS [place] GROIN PSEUDOANEURYSM Referring Phys: Glori Bickers --------------------------------------------------------------------------------  Exam: Right groin Indications: Patient complains of new pain and swelling in right groin. History: Status post Thrombin injection 01/12/20. Comparison Study: Prior study done 01/12/23 Performing Technologist: Sharion Dove RVS  Examination Guidelines: A complete evaluation includes B-mode imaging, spectral Doppler, color Doppler, and power Doppler as needed of all accessible portions of each vessel. Bilateral testing is considered an integral part of a complete examination. Limited examinations for reoccurring indications may be performed as noted.  Summary: Pseudoaneurysm has reopened since prior study done 01/12/23.    --------------------------------------------------------------------------------    Preliminary      Medications:     Scheduled Medications:  sodium chloride   Intravenous Once   acetaminophen  650 mg Oral Q6H   aspirin  81 mg Oral Daily   Chlorhexidine Gluconate Cloth  6 each Topical Daily   empagliflozin  10 mg Oral Daily   fentaNYL (SUBLIMAZE) injection  100 mcg Intravenous Once   fentaNYL (SUBLIMAZE) injection  25 mcg Intravenous Once   insulin aspart  0-15 Units Subcutaneous TID WC   rosuvastatin  10 mg Oral QHS   sodium chloride flush  10-40 mL Intracatheter Q12H   sodium chloride flush  3 mL Intravenous Q12H   ticagrelor  90 mg Oral BID    Infusions:  sodium chloride     amiodarone 30 mg/hr (01/16/23 0600)   milrinone 0.25 mcg/kg/min (01/16/23 0600)   norepinephrine (LEVOPHED) Adult infusion 4 mcg/min (01/16/23 0652)    PRN Medications: sodium chloride, acetaminophen, ipratropium-albuterol, nitroGLYCERIN, ondansetron (ZOFRAN) IV, mouth rinse, oxyCODONE, sodium chloride flush, sodium chloride flush, traMADol    Assessment/Plan   1. Cardiogenic shock/acute on chronic systolic  CHF: Echo this admission with EF 20-25%, mild LV dilation, mild RV dysfunction, severe MR with restricted posterior leaflet.  Echo in 2/23 with EF 50-55%.  Patient has history of nonischemic cardiomyopathy (?PVC-mediated) that improved.  She reports 2 months of dyspnea then had lateral STEMI 1/30 with chest pain.  Per Dr McLean--> He did not  think that her PLOM occlusion can explain the extent of her cardiomyopathy, so suspect EF may have been falling prior to this event (symptomatic at least 2 months).  Suspect mixed ischemic/nonischemic cardiomyopathy.  Infarct-related MR also plays a role (see below).  She initially had IABP for cardiogenic shock, now removed due to right leg ischemia. Lactic acid was elevated at 4.1>1. - Remains on milrinone 0.25 + NE 4 mcg. CO-OX 77%   -CVP- 9-10 . Give 40  mg IV lasix x1.  - Will wean inotropes.  -No GDMT while on pressors 2. Mitral regurgitation: severe MR with restricted posterior leaflet, suspect infarct-related MR with PLOM occlusion.  - Continue milrinone - Plan TEE this week  when more stable 3. CAD: Lateral STEMI with occluded pLOM, treated with DES.   - No chest pain.  - Continue ASA 81 - Continue ticagrelor - Continue statin.  4. 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 On milrinone, has had frequent PVCs and NSVT.  - continue amio until off pressors.  5. Right femoral PSA: s/p thrombin injection by Dr. Carlis Abbott.  - 2/4 stat u/s with recurrent PSA with active bleeding into PSA - Vascular Team appreciated. S/P PSA repair with VAC placement.  6. AKI on CKD stage 3b - Baseline 1.3-1.5 Peak 1.84 in setting of ATN  - Scr 1.34 - Resume Jardiance at some point  7. Acute blood loss anemia: Hgb down to 6.8 -> 2u -> 9.2 -> 8.6 -> 9.5->8.4  - Daily CBC  8. Acute Hypoxic Respiratory Failure - resolving. Now off bipap 9. PAF -  In A fib today. Continue amio drip.  - no AC until groin stable  OOB to chair today.   Amy  Clegg NP-C  01/16/2023 7:23 AM  Agree with above.   S/p repair of RFA pseudoaneurysm. Wound vac now in place. Feels better but still sore. Back in AF despite IV amio   Remains on milrinone and NE. Co-ox 77% CVP 10  General:  Lying in bed  No resp difficulty HEENT: normal Neck: supple. JVP 10 Carotids 2+ bilat; no bruits. No lymphadenopathy or thryomegaly appreciated. Cor: PMI nondisplaced. Irregular rate & rhythm. 2/6 MR Lungs: clear Abdomen: soft, nontender, nondistended. No hepatosplenomegaly. No bruits or masses. Good bowel sounds. Extremities: no cyanosis, clubbing, rash, edema R groin wound vac  Neuro: alert & orientedx3, cranial nerves grossly intact. moves all 4 extremities w/o difficulty. Affect pleasant  Remains tenuous but improved. OOB to chair today. No AC yet. Likely tomorrow. Wean inotropes as tolerated. Continue IV amio.   TEE later in week to look at MR.   CRITICAL CARE Performed by: Glori Bickers  Total critical care time: 35 minutes  Critical care time was exclusive of separately billable procedures and treating other patients.  Critical care was necessary to treat or prevent imminent or life-threatening deterioration.  Critical care was time spent personally by me (independent of midlevel providers or residents) on the following activities: development of treatment plan with patient and/or surrogate as well as nursing, discussions with consultants, evaluation of patient's response to treatment, examination of patient, obtaining history from patient or surrogate, ordering and performing treatments and interventions, ordering and review of laboratory studies, ordering and review of radiographic studies, pulse oximetry and re-evaluation of patient's condition.  Glori Bickers, MD  8:50 AM

## 2023-01-16 NOTE — Progress Notes (Addendum)
  Progress Note    01/16/2023 7:43 AM 1 Day Post-Op  Subjective:  feeling sore in the right groin   Vitals:   01/16/23 0700 01/16/23 0728  BP: (!) 106/43   Pulse: 83   Resp: (!) 22   Temp:  99.2 F (37.3 C)  SpO2: 93%     Physical Exam: Lungs:  nonlabored Incisions:  R groin incision with prevena with good seal Extremities:  BLE warm and well perfused   CBC    Component Value Date/Time   WBC 13.2 (H) 01/16/2023 0418   RBC 2.84 (L) 01/16/2023 0418   HGB 8.4 (L) 01/16/2023 0418   HGB 14.2 10/09/2020 0935   HCT 26.6 (L) 01/16/2023 0418   HCT 44.9 10/09/2020 0935   PLT 218 01/16/2023 0418   PLT 379 10/09/2020 0935   MCV 93.7 01/16/2023 0418   MCV 91 10/09/2020 0935   MCH 29.6 01/16/2023 0418   MCHC 31.6 01/16/2023 0418   RDW 14.9 01/16/2023 0418   RDW 13.4 10/09/2020 0935   LYMPHSABS 0.9 01/15/2023 0950   LYMPHSABS 1.1 10/09/2020 0935   MONOABS 1.4 (H) 01/15/2023 0950   EOSABS 0.6 (H) 01/15/2023 0950   EOSABS 0.2 10/09/2020 0935   BASOSABS 0.1 01/15/2023 0950   BASOSABS 0.1 10/09/2020 0935    BMET    Component Value Date/Time   NA 133 (L) 01/16/2023 0418   NA 145 (H) 07/07/2022 1007   K 4.0 01/16/2023 0418   CL 99 01/16/2023 0418   CO2 23 01/16/2023 0418   GLUCOSE 229 (H) 01/16/2023 0418   BUN 16 01/16/2023 0418   BUN 24 07/07/2022 1007   CREATININE 1.34 (H) 01/16/2023 0418   CREATININE 1.28 (H) 01/14/2022 1551   CALCIUM 8.0 (L) 01/16/2023 0418   GFRNONAA 40 (L) 01/16/2023 0418   GFRAA 49 (L) 10/09/2020 0933    INR    Component Value Date/Time   INR 1.4 (H) 01/10/2023 2145     Intake/Output Summary (Last 24 hours) at 01/16/2023 0743 Last data filed at 01/16/2023 0600 Gross per 24 hour  Intake 1400.49 ml  Output 1825 ml  Net -424.51 ml      Assessment/Plan:  82 y.o. female is 1 day post op,s/p: repair of bleeding R groin pseudoaneurysm   -R groin still tender. Incision is intact with prevena vac. Vac will stay on for 7 days  -Hct at 8.4  today -BLE warm and well perfused -Patient can attempt bed to BSC/chair transfer today  Vicente Serene, PA-C [facility] [phone] 01/16/2023 7:43 AM   VASCULAR STAFF ADDENDUM: I have independently interviewed and examined the patient. I agree with the above.  Groin soft, VAC to suction.  Signal in the foot.  VAC to stay for 7 days.  High risk for wound breakdown   Cassandria Santee, MD [facility] Phone Number: [phone] 01/16/2023 10:14 AM

## 2023-01-16 NOTE — TOC Initial Note (Addendum)
Transition of Care [facility]) - Initial/Assessment Note    Patient Details  Name: Natasha Woodard MRN: [id number] Date of Birth: 02/27/1941  Transition of Care [facility]) CM/SW Contact:    Erenest Rasher, RN Phone Number: [phone] 01/16/2023, 1:38 PM  Clinical Narrative:                 HF TOC CM spoke to pt's dtr, Corina. Pt lives in home with husband and son. States she will need hospital bed and RW with seat. Offered choice for [facility]. Requesting [facility] for [facility].   Waiting PT/OT notes, if [place] recommended. Will need HH orders with F2F.   Expected Discharge Plan: [place] Barriers to Discharge: Continued Medical Work up   Patient Goals and CMS Choice Patient states their goals for this hospitalization and ongoing recovery are:: wants mother to get better CMS Medicare.gov Compare Post Acute Care list provided to:: Patient Represenative (must comment) (daughter)        Expected Discharge Plan and Services   Discharge Planning Services: CM Consult   Living arrangements for the past 2 months: Single Family Home                                      Prior Living Arrangements/Services Living arrangements for the past 2 months: Single Family Home Lives with:: Spouse, Adult Children Patient language and need for interpreter reviewed:: Yes Do you feel safe going back to the place where you live?: Yes      Need for Family Participation in Patient Care: Yes (Comment) Care giver support system in place?: Yes (comment) Current home services: DME (CPAP, wheelchair, cane) Criminal Activity/Legal Involvement Pertinent to Current Situation/Hospitalization: No - Comment as needed  Activities of Daily Living Home Assistive Devices/Equipment: Cane (specify quad or straight) ADL Screening (condition at time of admission) Patient's cognitive ability adequate to safely complete daily activities?: Yes Is the patient deaf or have difficulty hearing?: No Does the patient  have difficulty seeing, even when wearing glasses/contacts?: No Does the patient have difficulty concentrating, remembering, or making decisions?: No Patient able to express need for assistance with ADLs?: Yes Does the patient have difficulty dressing or bathing?: No Independently performs ADLs?: Yes (appropriate for developmental age) Does the patient have difficulty walking or climbing stairs?: No Weakness of Legs: None Weakness of Arms/Hands: None  Permission Sought/Granted Permission sought to share information with : Case Manager, Family Supports, PCP Permission granted to share information with : Yes, Verbal Permission Granted  Share Information with NAME: Debria Broecker     Permission granted to share info w Relationship: husband  Permission granted to share info w Contact Information: [phone]  Emotional Assessment Appearance:: Appears stated age Attitude/Demeanor/Rapport: Lethargic       Psych Involvement: No (comment)  Admission diagnosis:  NSTEMI (non-ST elevated myocardial infarction) [facility]) [I21.4] Patient Active Problem List   Diagnosis Date Noted   NSTEMI (non-ST elevated myocardial infarction) ([place]) 01/10/2023   ACS (acute coronary syndrome) ([place]) 01/10/2023   Ischemic cardiomyopathy 01/10/2023   Nonrheumatic mitral valve regurgitation 01/10/2023   Vulvar cysts 12/02/2022   Vaginal candida 12/02/2022   Renal lesion 11/13/2022   ILD (interstitial lung disease) ([place]) 10/11/2022   Abnormal CT scan, kidney 10/11/2022   Bradycardia on ECG 08/31/2022   Superficial fungal infection of skin 03/20/2022   Onychomycosis of multiple toenails with type 2 diabetes mellitus and  peripheral angiopathy ([place]) 11/16/2021   Pain due to onychomycosis of toenail of left foot 11/16/2021   Intermittent claudication of both lower extremities due to atherosclerosis ([place]) 08/22/2021   Ganglion cyst of wrist 08/22/2021   Rash 02/17/2021   On amiodarone therapy 08/21/2020   CKD  (chronic kidney disease) stage 3, GFR 30-59 ml/min (HCC) 09/25/2019   Gastroesophageal reflux disease 06/19/2019   Health care maintenance 11/14/2018   Joint pain 08/07/2018   Cough 06/17/2018   Sensorineural hearing loss (SNHL), bilateral 03/20/2018   Complex sleep apnea syndrome 10/05/2017   Chronic heart failure with preserved ejection fraction (HFpEF) ([place]) 09/08/2017   Dilated cardiomyopathy ([place]) 06/30/2017   Frequent PVCs 06/30/2017   Advance care planning 08/19/2014   Medicare annual wellness visit, subsequent 07/30/2013   Type II diabetes mellitus with peripheral artery disease ([place]) 05/19/2010   Hyperlipidemia associated with type 2 diabetes mellitus ([place]) 05/19/2010   Essential hypertension 05/19/2010   Ichthyosis 05/19/2010   PCP:  Tonia Ghent, MD Pharmacy:   [facility] [place], [place] - [address] [place] [address] Phone: [phone] Fax: [phone]  [facility], [place] [address] [place] [address] Phone: [phone] Fax: [place] [address] [place] [address] Phone: [phone] Fax: [phone]     Social Determinants of Health (SDOH) Social History: SDOH Screenings   Food Insecurity: No Food Insecurity (01/12/2023)  Housing: Low Risk  (01/12/2023)  Transportation Needs: No Transportation Needs (01/12/2023)  Utilities: Not At Risk (01/12/2023)  Depression (PHQ2-9): Low Risk  (05/12/2022)  Financial Resource Strain: Low Risk  (05/12/2022)  Physical Activity: Sufficiently Active (05/12/2022)  Stress: No Stress Concern Present (05/12/2022)  Tobacco Use: Medium Risk (01/16/2023)   SDOH Interventions: Food Insecurity Interventions: Intervention Not Indicated Housing Interventions: Intervention Not Indicated Transportation Interventions: Intervention Not  Indicated Utilities Interventions: Intervention Not Indicated   Readmission Risk Interventions     No data to display

## 2023-01-16 NOTE — Telephone Encounter (Signed)
Pharmacy Patient Advocate Encounter  Insurance verification completed.    The patient is insured through [employer]   The patient is currently admitted and ran test claims for the following: Eliquis.  Copays and coinsurance results were relayed to Inpatient clinical team.   

## 2023-01-16 NOTE — TOC Benefit Eligibility Note (Signed)
Patient Advocate Encounter  Insurance verification completed.    The patient is currently admitted and upon discharge could be taking Eliquis.  The current 30 day co-pay is $47.00.   The patient is insured through [employer]         

## 2023-01-17 DIAGNOSIS — I5023 Acute on chronic systolic (congestive) heart failure: Secondary | ICD-10-CM | POA: Diagnosis not present

## 2023-01-17 DIAGNOSIS — I214 Non-ST elevation (NSTEMI) myocardial infarction: Secondary | ICD-10-CM | POA: Diagnosis not present

## 2023-01-17 DIAGNOSIS — I2510 Atherosclerotic heart disease of native coronary artery without angina pectoris: Secondary | ICD-10-CM | POA: Diagnosis not present

## 2023-01-17 DIAGNOSIS — R570 Cardiogenic shock: Secondary | ICD-10-CM | POA: Diagnosis not present

## 2023-01-17 LAB — TYPE AND SCREEN
ABO/RH(D): O POS
Antibody Screen: NEGATIVE
Unit division: 0
Unit division: 0
Unit division: 0
Unit division: 0
Unit division: 0
Unit division: 0

## 2023-01-17 LAB — BASIC METABOLIC PANEL
Anion gap: 9 (ref 5–15)
BUN: 20 mg/dL (ref 8–23)
CO2: 24 mmol/L (ref 22–32)
Calcium: 8 mg/dL — ABNORMAL LOW (ref 8.9–10.3)
Chloride: 98 mmol/L (ref 98–111)
Creatinine, Ser: 1.44 mg/dL — ABNORMAL HIGH (ref 0.44–1.00)
GFR, Estimated: 36 mL/min — ABNORMAL LOW (ref >60)
Glucose, Bld: 208 mg/dL — ABNORMAL HIGH (ref 70–99)
Potassium: 3.7 mmol/L (ref 3.5–5.1)
Sodium: 131 mmol/L — ABNORMAL LOW (ref 135–145)

## 2023-01-17 LAB — GLUCOSE, CAPILLARY
Glucose-Capillary: 129 mg/dL — ABNORMAL HIGH (ref 70–99)
Glucose-Capillary: 133 mg/dL — ABNORMAL HIGH (ref 70–99)
Glucose-Capillary: 152 mg/dL — ABNORMAL HIGH (ref 70–99)
Glucose-Capillary: 166 mg/dL — ABNORMAL HIGH (ref 70–99)
Glucose-Capillary: 167 mg/dL — ABNORMAL HIGH (ref 70–99)

## 2023-01-17 LAB — CBC
HCT: 26.1 % — ABNORMAL LOW (ref 36.0–46.0)
Hemoglobin: 8.3 g/dL — ABNORMAL LOW (ref 12.0–15.0)
MCH: 29.5 pg (ref 26.0–34.0)
MCHC: 31.8 g/dL (ref 30.0–36.0)
MCV: 92.9 fL (ref 80.0–100.0)
Platelets: 284 10*3/uL (ref 150–400)
RBC: 2.81 MIL/uL — ABNORMAL LOW (ref 3.87–5.11)
RDW: 14.8 % (ref 11.5–15.5)
WBC: 14 10*3/uL — ABNORMAL HIGH (ref 4.0–10.5)
nRBC: 0 % (ref 0.0–0.2)

## 2023-01-17 LAB — BPAM RBC
Blood Product Expiration Date: 202403012359
Blood Product Expiration Date: 202403012359
Blood Product Expiration Date: 202403052359
Blood Product Expiration Date: 202403062359
Blood Product Expiration Date: 202403082359
Blood Product Expiration Date: 202403082359
ISSUE DATE / TIME: 202402021225
ISSUE DATE / TIME: 202402021527
ISSUE DATE / TIME: 202402041100
ISSUE DATE / TIME: 202402041100
ISSUE DATE / TIME: 202402041100
ISSUE DATE / TIME: 202402041609
Unit Type and Rh: 5100
Unit Type and Rh: 5100
Unit Type and Rh: 5100
Unit Type and Rh: 5100
Unit Type and Rh: 5100
Unit Type and Rh: 5100

## 2023-01-17 LAB — COOXEMETRY PANEL
Carboxyhemoglobin: 2.7 % — ABNORMAL HIGH (ref 0.5–1.5)
Methemoglobin: <0.7 % (ref 0.0–1.5)
O2 Saturation: 66.6 %
Total hemoglobin: 8.7 g/dL — ABNORMAL LOW (ref 12.0–16.0)

## 2023-01-17 MED ORDER — MIDODRINE HCL 5 MG PO TABS
10.0000 mg | ORAL_TABLET | Freq: Three times a day (TID) | ORAL | Status: DC
  Administered 2023-01-17 – 2023-01-24 (×21): 10 mg via ORAL
  Filled 2023-01-17 (×21): qty 2

## 2023-01-17 MED ORDER — POTASSIUM CHLORIDE CRYS ER 20 MEQ PO TBCR
20.0000 meq | EXTENDED_RELEASE_TABLET | Freq: Once | ORAL | Status: AC
  Administered 2023-01-17: 20 meq via ORAL
  Filled 2023-01-17: qty 1

## 2023-01-17 MED ORDER — MILRINONE LACTATE IN DEXTROSE 20-5 MG/100ML-% IV SOLN
0.1250 ug/kg/min | INTRAVENOUS | Status: DC
  Administered 2023-01-18: 0.125 ug/kg/min via INTRAVENOUS
  Filled 2023-01-17 (×2): qty 100

## 2023-01-17 MED ORDER — TORSEMIDE 20 MG PO TABS
40.0000 mg | ORAL_TABLET | Freq: Every day | ORAL | Status: DC
  Administered 2023-01-17 – 2023-01-21 (×5): 40 mg via ORAL
  Filled 2023-01-17 (×5): qty 2

## 2023-01-17 NOTE — Progress Notes (Addendum)
Advanced Heart Failure Rounding Note  PCP-Cardiologist: Dr. Ellyn Hack AHF: Dr. 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 Now admitted for acute lateral wall MI 2/2 distal LCx occlusion c/b severe ischemic MR and acute systolic heart failure w/ low output, CI 2.1 on RHC. Required placement of IABP, but later removed given concerns for developing limb ischemia=>found to have Right femoral PSA   Subjective:   1/31 Underwent thrombin injection to right femoral PSA . Hgb drifting down 10.3>8.3>6.8  2/1 Started on amio drip to suppress PVCs. Got dose of IV lasix x1. CVP low.  2/4 Ruptured PSA. Returned to OR for repair. VAC in place 2/5 Midodrine 5 mg three times daily added.  Back in A fib.    Norepi 7 mcg + Milrinone 0.25 mcg + amio 30. CO-OX 67%   Asking for scheduled tramadol at 8 and 8.    Objective:   Weight Range: 74.7 kg Body mass index is 30.12 kg/m.   Vital Signs:   Temp:  [98.2 F (36.8 C)-99.2 F (37.3 C)] 98.2 F (36.8 C) (02/06 0000) Pulse Rate:  [72-101] 88 (02/06 0645) Resp:  [14-35] 27 (02/06 0645) BP: (83-137)/(40-83) 110/72 (02/06 0645) SpO2:  [89 %-99 %] 95 % (02/06 0645) Weight:  [74.7 kg] 74.7 kg (02/06 0345) Last BM Date : 01/16/23  Weight change: Filed Weights   01/15/23 0500 01/16/23 0435 01/17/23 0345  Weight: 78.3 kg 77.9 kg 74.7 kg    Intake/Output:   Intake/Output Summary (Last 24 hours) at 01/17/2023 0721 Last data filed at 01/17/2023 0649 Gross per 24 hour  Intake 975.06 ml  Output 1640 ml  Net -664.94 ml    CVP 8-9  Physical Exam   General:  In bed.  No resp difficulty HEENT: normal Neck: supple. JVP 7-8 . Carotids 2+ bilat; no bruits. No lymphadenopathy or thryomegaly appreciated. Cor: PMI nondisplaced. Irregular rate & rhythm. No rubs, gallops or murmurs. Lungs: clear Abdomen: soft, nontender, nondistended. No hepatosplenomegaly. No bruits  or masses. Good bowel sounds. Extremities: no cyanosis, clubbing, rash, edema .  R Prevena VAC R thigh tender.  Neuro: alert & orientedx3, cranial nerves grossly intact. moves all 4 extremities w/o difficulty. Affect pleasant  Telemetry   A fib 80-90s  Labs    CBC Recent Labs    01/15/23 0950 01/15/23 0952 01/16/23 0418 01/17/23 0335  WBC 10.4  --  13.2* 14.0*  NEUTROABS 7.4  --   --   --   HGB 9.3*   < > 8.4* 8.3*  HCT 29.2*   < > 26.6* 26.1*  MCV 93.6  --  93.7 92.9  PLT 256  --  218 284   < > = values in this interval not displayed.   Basic Metabolic Panel Recent Labs    01/15/23 0457 01/15/23 0952 01/16/23 0418 01/17/23 0335  NA 133*   < > 133* 131*  K 3.8   < > 4.0 3.7  CL 97*  --  99 98  CO2 27  --  23 24  GLUCOSE 310*  --  229* 208*  BUN 20  --  16 20  CREATININE 1.43*  --  1.34* 1.44*  CALCIUM 7.7*  --  8.0* 8.0*  MG 2.3  --  2.0  --    < > = values in this interval not displayed.   Liver Function Tests No results  for input(s): "AST", "ALT", "ALKPHOS", "BILITOT", "PROT", "ALBUMIN" in the last 72 hours.  No results for input(s): "LIPASE", "AMYLASE" in the last 72 hours. Cardiac Enzymes No results for input(s): "CKTOTAL", "CKMB", "CKMBINDEX", "TROPONINI" in the last 72 hours.  BNP: BNP (last 3 results) Recent Labs    06/17/22 1000 08/31/22 1613 01/10/23 1048  BNP 1,397.5* 1,178.5* >4,500.0*    ProBNP (last 3 results) Recent Labs    06/09/22 1506  PROBNP 4,638*     D-Dimer No results for input(s): "DDIMER" in the last 72 hours. Hemoglobin A1C No results for input(s): "HGBA1C" in the last 72 hours. Fasting Lipid Panel No results for input(s): "CHOL", "HDL", "LDLCALC", "TRIG", "CHOLHDL", "LDLDIRECT" in the last 72 hours.  Thyroid Function Tests No results for input(s): "TSH", "T4TOTAL", "T3FREE", "THYROIDAB" in the last 72 hours.  Invalid input(s): "FREET3"  Other results:   Imaging    No results found.   Medications:      Scheduled Medications:  sodium chloride   Intravenous Once   acetaminophen  650 mg Oral Q6H   aspirin  81 mg Oral Daily   Chlorhexidine Gluconate Cloth  6 each Topical Daily   empagliflozin  10 mg Oral Daily   fentaNYL (SUBLIMAZE) injection  100 mcg Intravenous Once   fentaNYL (SUBLIMAZE) injection  25 mcg Intravenous Once   insulin aspart  0-15 Units Subcutaneous TID WC   midodrine  5 mg Oral TID WC   rosuvastatin  10 mg Oral QHS   sodium chloride flush  10-40 mL Intracatheter Q12H   sodium chloride flush  3 mL Intravenous Q12H   ticagrelor  90 mg Oral BID    Infusions:  sodium chloride     amiodarone 30 mg/hr (01/17/23 0600)   milrinone 0.25 mcg/kg/min (01/17/23 0649)   norepinephrine (LEVOPHED) Adult infusion 7 mcg/min (01/17/23 0600)    PRN Medications: sodium chloride, acetaminophen, ipratropium-albuterol, nitroGLYCERIN, ondansetron (ZOFRAN) IV, mouth rinse, oxyCODONE, sodium chloride flush, sodium chloride flush, traMADol    Assessment/Plan   1. Cardiogenic shock/acute on chronic systolic CHF: Echo this admission with EF 20-25%, mild LV dilation, mild RV dysfunction, severe MR with restricted posterior leaflet.  Echo in 2/23 with EF 50-55%.  Patient has history of nonischemic cardiomyopathy (?PVC-mediated) that improved.  She reports 2 months of dyspnea then had lateral STEMI 1/30 with chest pain.  Per Dr McLean--> He did not  think that her PLOM occlusion can explain the extent of her cardiomyopathy, so suspect EF may have been falling prior to this event (symptomatic at least 2 months).  Suspect mixed ischemic/nonischemic cardiomyopathy.  Infarct-related MR also plays a role (see below).  She initially had IABP for cardiogenic shock, now removed due to right leg ischemia. Lactic acid was elevated at 4.1>1. - Remains on milrinone 0.25 + NE 7  mcg. CO-OX 67% -CVP -9-10 . Start torsemide 40 mg daily.  - Will wean inotropes. Increase midodrine to 10 mg tid.  -No GDMT while  on pressors - Renal function stable.  2. Mitral regurgitation: severe MR with restricted posterior leaflet, suspect infarct-related MR with PLOM occlusion.  - Continue milrinone - Plan TEE this week  when more stable and off pressors.  3. CAD: Lateral STEMI with occluded pLOM, treated with DES.   - No chest pain.  - Continue ASA 81 - Continue ticagrelor - Continue statin.  4. 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 On milrinone, has had frequent PVCs and NSVT.  - continue amio  until off pressors.  5. Right femoral PSA: s/p thrombin injection by Dr. Carlis Abbott.  - 2/4 stat u/s with recurrent PSA with active bleeding into PSA - Vascular Team appreciated. S/P PSA repair with VAC placement.  - Ok to get OOB and take a few steps per Vascular.  6. AKI on CKD stage 3b - Baseline 1.3-1.5 Peak 1.84 in setting of ATN  - Scr 1.44 - Resume Jardiance at some point  7. Acute blood loss anemia: Hgb down to 6.8 -> 2u -> 9.2 -> 8.6 -> 9.5->8.4 ->8.3  - Daily CBC  8. Acute Hypoxic Respiratory Failure - resolving. On room air 9. PAF -  In A fib today. Continue amio drip.  - no AC until groin stable  OOB today and ambulate.   Amy Clegg NP-C  01/17/2023 7:21 AM   See my note from 225pm.   Glori Bickers, MD  9:01 PM

## 2023-01-17 NOTE — Progress Notes (Signed)
  Progress Note    01/17/2023 7:55 AM 2 Days Post-Op  Subjective:  right groin soreness improved    Vitals:   01/17/23 0630 01/17/23 0645  BP: (!) 126/50 110/72  Pulse: 80 88  Resp: 16 (!) 27  Temp:    SpO2: 97% 95%    Physical Exam Lungs:  nonlabored Incisions:  R groin with prevena with good seal Extremities:  BLE well perfused   CBC    Component Value Date/Time   WBC 14.0 (H) 01/17/2023 0335   RBC 2.81 (L) 01/17/2023 0335   HGB 8.3 (L) 01/17/2023 0335   HGB 14.2 10/09/2020 0935   HCT 26.1 (L) 01/17/2023 0335   HCT 44.9 10/09/2020 0935   PLT 284 01/17/2023 0335   PLT 379 10/09/2020 0935   MCV 92.9 01/17/2023 0335   MCV 91 10/09/2020 0935   MCH 29.5 01/17/2023 0335   MCHC 31.8 01/17/2023 0335   RDW 14.8 01/17/2023 0335   RDW 13.4 10/09/2020 0935   LYMPHSABS 0.9 01/15/2023 0950   LYMPHSABS 1.1 10/09/2020 0935   MONOABS 1.4 (H) 01/15/2023 0950   EOSABS 0.6 (H) 01/15/2023 0950   EOSABS 0.2 10/09/2020 0935   BASOSABS 0.1 01/15/2023 0950   BASOSABS 0.1 10/09/2020 0935    BMET    Component Value Date/Time   NA 131 (L) 01/17/2023 0335   NA 145 (H) 07/07/2022 1007   K 3.7 01/17/2023 0335   CL 98 01/17/2023 0335   CO2 24 01/17/2023 0335   GLUCOSE 208 (H) 01/17/2023 0335   BUN 20 01/17/2023 0335   BUN 24 07/07/2022 1007   CREATININE 1.44 (H) 01/17/2023 0335   CREATININE 1.28 (H) 01/14/2022 1551   CALCIUM 8.0 (L) 01/17/2023 0335   GFRNONAA 36 (L) 01/17/2023 0335   GFRAA 49 (L) 10/09/2020 0933    INR    Component Value Date/Time   INR 1.4 (H) 01/10/2023 2145     Intake/Output Summary (Last 24 hours) at 01/17/2023 0755 Last data filed at 01/17/2023 3875 Gross per 24 hour  Intake 975.06 ml  Output 1640 ml  Net -664.94 ml      Assessment/Plan:  82 y.o. female is 2 days post op,s/p: repair of bleeding R groin pseudoaneurysm    -R groin incision intact with prevena. Hope to stay on for 5 more days -BLE warm and well perfused. Intact motor and  sensation -Was able to transfer from bed to chair yesterday. Can attempt walking today with assistance -Hct stable   Vicente Serene, PA-C [facility] [phone] 01/17/2023 7:55 AM

## 2023-01-17 NOTE — Progress Notes (Addendum)
Advanced Heart Failure Rounding Note  PCP-Cardiologist: Dr. Ellyn Hack AHF: Dr. 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 Now admitted for acute lateral wall MI 2/2 distal LCx occlusion c/b severe ischemic MR and acute systolic heart failure w/ low output, CI 2.1 on RHC. Required placement of IABP, but later removed given concerns for developing limb ischemia=>found to have Right femoral PSA   Subjective:   1/31 Underwent thrombin injection to right femoral PSA . Hgb drifting down 10.3>8.3>6.8  2/1 Started on amio drip to suppress PVCs. Got dose of IV lasix x1. CVP low.  2/4 Ruptured PSA. Returned to OR for repair. VAC in place 2/5 Midodrine 5 mg three times daily added.  Back in A fib.    Norepi 7 mcg + Milrinone 0.25 mcg + amio 30. CO-OX 67%   Asking for scheduled tramadol at 8 and 8.    Objective:   Weight Range: 74.7 kg Body mass index is 30.12 kg/m.   Vital Signs:   Temp:  [98.2 F (36.8 C)-99.4 F (37.4 C)] 98.8 F (37.1 C) (02/06 1143) Pulse Rate:  [42-104] 98 (02/06 1400) Resp:  [15-35] 22 (02/06 1400) BP: (87-137)/(38-84) 122/63 (02/06 1400) SpO2:  [89 %-99 %] 97 % (02/06 1400) Weight:  [74.7 kg] 74.7 kg (02/06 0345) Last BM Date : 01/17/23  Weight change: Filed Weights   01/15/23 0500 01/16/23 0435 01/17/23 0345  Weight: 78.3 kg 77.9 kg 74.7 kg    Intake/Output:   Intake/Output Summary (Last 24 hours) at 01/17/2023 1420 Last data filed at 01/17/2023 1200 Gross per 24 hour  Intake 953.66 ml  Output 1075 ml  Net -121.34 ml     CVP 8-9  Physical Exam   General:  In bed.  No resp difficulty HEENT: normal Neck: supple. JVP 7-8 . Carotids 2+ bilat; no bruits. No lymphadenopathy or thryomegaly appreciated. Cor: PMI nondisplaced. Irregular rate & rhythm. No rubs, gallops or murmurs. Lungs: clear Abdomen: soft, nontender, nondistended. No hepatosplenomegaly. No  bruits or masses. Good bowel sounds. Extremities: no cyanosis, clubbing, rash, edema .  R Prevena VAC R thigh tender.  Neuro: alert & orientedx3, cranial nerves grossly intact. moves all 4 extremities w/o difficulty. Affect pleasant  Telemetry   A fib 80-90s  Labs    CBC Recent Labs    01/15/23 0950 01/15/23 0952 01/16/23 0418 01/17/23 0335  WBC 10.4  --  13.2* 14.0*  NEUTROABS 7.4  --   --   --   HGB 9.3*   < > 8.4* 8.3*  HCT 29.2*   < > 26.6* 26.1*  MCV 93.6  --  93.7 92.9  PLT 256  --  218 284   < > = values in this interval not displayed.    Basic Metabolic Panel Recent Labs    01/15/23 0457 01/15/23 0952 01/16/23 0418 01/17/23 0335  NA 133*   < > 133* 131*  K 3.8   < > 4.0 3.7  CL 97*  --  99 98  CO2 27  --  23 24  GLUCOSE 310*  --  229* 208*  BUN 20  --  16 20  CREATININE 1.43*  --  1.34* 1.44*  CALCIUM 7.7*  --  8.0* 8.0*  MG 2.3  --  2.0  --    < > = values in this interval not displayed.    Liver Function  Tests No results for input(s): "AST", "ALT", "ALKPHOS", "BILITOT", "PROT", "ALBUMIN" in the last 72 hours.  No results for input(s): "LIPASE", "AMYLASE" in the last 72 hours. Cardiac Enzymes No results for input(s): "CKTOTAL", "CKMB", "CKMBINDEX", "TROPONINI" in the last 72 hours.  BNP: BNP (last 3 results) Recent Labs    06/17/22 1000 08/31/22 1613 01/10/23 1048  BNP 1,397.5* 1,178.5* >4,500.0*     ProBNP (last 3 results) Recent Labs    06/09/22 1506  PROBNP 4,638*      D-Dimer No results for input(s): "DDIMER" in the last 72 hours. Hemoglobin A1C No results for input(s): "HGBA1C" in the last 72 hours. Fasting Lipid Panel No results for input(s): "CHOL", "HDL", "LDLCALC", "TRIG", "CHOLHDL", "LDLDIRECT" in the last 72 hours.  Thyroid Function Tests No results for input(s): "TSH", "T4TOTAL", "T3FREE", "THYROIDAB" in the last 72 hours.  Invalid input(s): "FREET3"  Other results:   Imaging    No results  found.   Medications:     Scheduled Medications:  sodium chloride   Intravenous Once   acetaminophen  650 mg Oral Q6H   aspirin  81 mg Oral Daily   Chlorhexidine Gluconate Cloth  6 each Topical Daily   empagliflozin  10 mg Oral Daily   fentaNYL (SUBLIMAZE) injection  100 mcg Intravenous Once   fentaNYL (SUBLIMAZE) injection  25 mcg Intravenous Once   insulin aspart  0-15 Units Subcutaneous TID WC   midodrine  10 mg Oral TID WC   rosuvastatin  10 mg Oral QHS   sodium chloride flush  10-40 mL Intracatheter Q12H   sodium chloride flush  3 mL Intravenous Q12H   ticagrelor  90 mg Oral BID   torsemide  40 mg Oral Daily    Infusions:  sodium chloride     amiodarone 30 mg/hr (01/17/23 1200)   milrinone 0.125 mcg/kg/min (01/17/23 1200)   norepinephrine (LEVOPHED) Adult infusion 4 mcg/min (01/17/23 1200)    PRN Medications: sodium chloride, acetaminophen, ipratropium-albuterol, nitroGLYCERIN, ondansetron (ZOFRAN) IV, mouth rinse, oxyCODONE, sodium chloride flush, sodium chloride flush, traMADol    Assessment/Plan   1. Cardiogenic shock/acute on chronic systolic CHF: Echo this admission with EF 20-25%, mild LV dilation, mild RV dysfunction, severe MR with restricted posterior leaflet.  Echo in 2/23 with EF 50-55%.  Patient has history of nonischemic cardiomyopathy (?PVC-mediated) that improved.  She reports 2 months of dyspnea then had lateral STEMI 1/30 with chest pain.  Per Dr McLean--> He did not  think that her PLOM occlusion can explain the extent of her cardiomyopathy, so suspect EF may have been falling prior to this event (symptomatic at least 2 months).  Suspect mixed ischemic/nonischemic cardiomyopathy.  Infarct-related MR also plays a role (see below).  She initially had IABP for cardiogenic shock, now removed due to right leg ischemia. Lactic acid was elevated at 4.1>1. - Remains on milrinone 0.25 + NE 7  mcg. CO-OX 67% -CVP -9-10 . Start torsemide 40 mg daily.  - Will wean  inotropes. Increase midodrine to 10 mg tid.  -No GDMT while on pressors - Renal function stable.  2. Mitral regurgitation: severe MR with restricted posterior leaflet, suspect infarct-related MR with PLOM occlusion.  - Continue milrinone - Plan TEE this week  when more stable and off pressors.  3. CAD: Lateral STEMI with occluded pLOM, treated with DES.   - No chest pain.  - Continue ASA 81 - Continue ticagrelor - Continue statin.  4. PVCs: She has h/o PVC-related cardiomyopathy that resolved in the  past. On milrinone, has had frequent PVCs and NSVT.  - continue amio until off pressors.  5. Right femoral PSA: s/p thrombin injection by Dr. Carlis Abbott.  - 2/4 stat u/s with recurrent PSA with active bleeding into PSA - Vascular Team appreciated. S/P PSA repair with VAC placement.  - Ok to get OOB and take a few steps per Vascular.  6. AKI on CKD stage 3b - Baseline 1.3-1.5 Peak 1.84 in setting of ATN  - Scr 1.44 - Resume Jardiance at some point  7. Acute blood loss anemia: Hgb down to 6.8 -> 2u -> 9.2 -> 8.6 -> 9.5->8.4 ->8.3  - Daily CBC  8. Acute Hypoxic Respiratory Failure - resolving. On room air 9. PAF -  In A fib today. Continue amio drip.  - no AC until groin stable  OOB today and ambulate.   Amy Clegg NP-C  01/17/2023 2:20 PM  Agree with above.    Denies CP or SOB. Feels weak. Butt sore.   Remains on NE and milrinone. Co-ox 67% On IV amio -> in nsr with atrial bigeminy  Groin site ok   General:  Weak appearing. No resp difficulty HEENT: normal Neck: supple. JVP 10 Carotids 2+ bilat; no bruits. No lymphadenopathy or thryomegaly appreciated. Cor: PMI nondisplaced. Regular rate & rhythm. 2/6 MR Lungs: cleaIrr Abdomen: soft, nontender, nondistended. No hepatosplenomegaly. No bruits or masses. Good bowel sounds. Extremities: no cyanosis, clubbing, rash, tr edema  R groin Praveena ok  Neuro: alert & orientedx3, cranial nerves grossly intact. moves all 4 extremities w/o  difficulty. Affect pleasant  Remains tenuous. Continue to wean inotropes as tolerated. Continue amio. Start torsemide  TEE tomorrow to look at MV. No AC yet with groin issues.   PT/OT to see.   CRITICAL CARE Performed by: Glori Bickers  Total critical care time: 35 minutes  Critical care time was exclusive of separately billable procedures and treating other patients.  Critical care was necessary to treat or prevent imminent or life-threatening deterioration.  Critical care was time spent personally by me (independent of midlevel providers or residents) on the following activities: development of treatment plan with patient and/or surrogate as well as nursing, discussions with consultants, evaluation of patient's response to treatment, examination of patient, obtaining history from patient or surrogate, ordering and performing treatments and interventions, ordering and review of laboratory studies, ordering and review of radiographic studies, pulse oximetry and re-evaluation of patient's condition.  Glori Bickers, MD  2:23 PM

## 2023-01-17 NOTE — Progress Notes (Signed)
  Progress Note    01/17/2023 9:49 AM 2 Days Post-Op  Subjective:  right groin soreness improved    Vitals:   01/17/23 0800 01/17/23 0830  BP:    Pulse: (!) 104   Resp: 20   Temp:  99.4 F (37.4 C)  SpO2: 91%     Physical Exam Lungs:  nonlabored Incisions:  R groin with prevena with good seal Extremities:  BLE well perfused   CBC    Component Value Date/Time   WBC 14.0 (H) 01/17/2023 0335   RBC 2.81 (L) 01/17/2023 0335   HGB 8.3 (L) 01/17/2023 0335   HGB 14.2 10/09/2020 0935   HCT 26.1 (L) 01/17/2023 0335   HCT 44.9 10/09/2020 0935   PLT 284 01/17/2023 0335   PLT 379 10/09/2020 0935   MCV 92.9 01/17/2023 0335   MCV 91 10/09/2020 0935   MCH 29.5 01/17/2023 0335   MCHC 31.8 01/17/2023 0335   RDW 14.8 01/17/2023 0335   RDW 13.4 10/09/2020 0935   LYMPHSABS 0.9 01/15/2023 0950   LYMPHSABS 1.1 10/09/2020 0935   MONOABS 1.4 (H) 01/15/2023 0950   EOSABS 0.6 (H) 01/15/2023 0950   EOSABS 0.2 10/09/2020 0935   BASOSABS 0.1 01/15/2023 0950   BASOSABS 0.1 10/09/2020 0935    BMET    Component Value Date/Time   NA 131 (L) 01/17/2023 0335   NA 145 (H) 07/07/2022 1007   K 3.7 01/17/2023 0335   CL 98 01/17/2023 0335   CO2 24 01/17/2023 0335   GLUCOSE 208 (H) 01/17/2023 0335   BUN 20 01/17/2023 0335   BUN 24 07/07/2022 1007   CREATININE 1.44 (H) 01/17/2023 0335   CREATININE 1.28 (H) 01/14/2022 1551   CALCIUM 8.0 (L) 01/17/2023 0335   GFRNONAA 36 (L) 01/17/2023 0335   GFRAA 49 (L) 10/09/2020 0933    INR    Component Value Date/Time   INR 1.4 (H) 01/10/2023 2145     Intake/Output Summary (Last 24 hours) at 01/17/2023 0949 Last data filed at 01/17/2023 0800 Gross per 24 hour  Intake 952.59 ml  Output 1465 ml  Net -512.41 ml       Assessment/Plan:  82 y.o. female is 2 days post op,s/p: repair of bleeding R groin pseudoaneurysm    -R groin incision intact with prevena. Hope to stay on for 5 more days -BLE warm and well perfused. Intact motor and  sensation -Was able to transfer from bed to chair yesterday. Can attempt walking today with assistance -Hct stable   Vicente Serene, PA-C [facility] [phone] 01/17/2023 9:49 AM   VASCULAR STAFF ADDENDUM: I have independently interviewed and examined the patient. I agree with the above.    Cassandria Santee, MD [facility] Phone Number: [phone] 01/17/2023 9:49 AM

## 2023-01-18 ENCOUNTER — Inpatient Hospital Stay (HOSPITAL_COMMUNITY): Payer: Medicare Other

## 2023-01-18 ENCOUNTER — Encounter (HOSPITAL_COMMUNITY): Payer: Self-pay | Admitting: Internal Medicine

## 2023-01-18 DIAGNOSIS — R570 Cardiogenic shock: Secondary | ICD-10-CM | POA: Diagnosis not present

## 2023-01-18 DIAGNOSIS — I5023 Acute on chronic systolic (congestive) heart failure: Secondary | ICD-10-CM | POA: Diagnosis not present

## 2023-01-18 DIAGNOSIS — I340 Nonrheumatic mitral (valve) insufficiency: Secondary | ICD-10-CM | POA: Diagnosis not present

## 2023-01-18 DIAGNOSIS — I214 Non-ST elevation (NSTEMI) myocardial infarction: Secondary | ICD-10-CM | POA: Diagnosis not present

## 2023-01-18 DIAGNOSIS — I2510 Atherosclerotic heart disease of native coronary artery without angina pectoris: Secondary | ICD-10-CM | POA: Diagnosis not present

## 2023-01-18 LAB — GLUCOSE, CAPILLARY
Glucose-Capillary: 113 mg/dL — ABNORMAL HIGH (ref 70–99)
Glucose-Capillary: 131 mg/dL — ABNORMAL HIGH (ref 70–99)
Glucose-Capillary: 139 mg/dL — ABNORMAL HIGH (ref 70–99)
Glucose-Capillary: 129 mg/dL — ABNORMAL HIGH (ref 70–99)

## 2023-01-18 LAB — CBC
HCT: 24 % — ABNORMAL LOW (ref 36.0–46.0)
HCT: 26.5 % — ABNORMAL LOW (ref 36.0–46.0)
Hemoglobin: 7.5 g/dL — ABNORMAL LOW (ref 12.0–15.0)
Hemoglobin: 8.7 g/dL — ABNORMAL LOW (ref 12.0–15.0)
MCH: 29.1 pg (ref 26.0–34.0)
MCH: 29.9 pg (ref 26.0–34.0)
MCHC: 31.3 g/dL (ref 30.0–36.0)
MCHC: 32.8 g/dL (ref 30.0–36.0)
MCV: 93 fL (ref 80.0–100.0)
MCV: 91.1 fL (ref 80.0–100.0)
Platelets: 312 10*3/uL (ref 150–400)
Platelets: 396 10*3/uL (ref 150–400)
RBC: 2.58 MIL/uL — ABNORMAL LOW (ref 3.87–5.11)
RBC: 2.91 MIL/uL — ABNORMAL LOW (ref 3.87–5.11)
RDW: 14.7 % (ref 11.5–15.5)
RDW: 14.9 % (ref 11.5–15.5)
WBC: 11 10*3/uL — ABNORMAL HIGH (ref 4.0–10.5)
WBC: 10.7 10*3/uL — ABNORMAL HIGH (ref 4.0–10.5)
nRBC: 0 % (ref 0.0–0.2)
nRBC: 0 % (ref 0.0–0.2)

## 2023-01-18 LAB — BASIC METABOLIC PANEL
Anion gap: 11 (ref 5–15)
BUN: 23 mg/dL (ref 8–23)
CO2: 26 mmol/L (ref 22–32)
Calcium: 7.9 mg/dL — ABNORMAL LOW (ref 8.9–10.3)
Chloride: 94 mmol/L — ABNORMAL LOW (ref 98–111)
Creatinine, Ser: 1.58 mg/dL — ABNORMAL HIGH (ref 0.44–1.00)
GFR, Estimated: 32 mL/min — ABNORMAL LOW (ref >60)
Glucose, Bld: 220 mg/dL — ABNORMAL HIGH (ref 70–99)
Potassium: 3.7 mmol/L (ref 3.5–5.1)
Sodium: 131 mmol/L — ABNORMAL LOW (ref 135–145)

## 2023-01-18 LAB — COOXEMETRY PANEL
Carboxyhemoglobin: 3.5 % — ABNORMAL HIGH (ref 0.5–1.5)
Methemoglobin: 1.3 % (ref 0.0–1.5)
O2 Saturation: 63.5 %
Total hemoglobin: 8 g/dL — ABNORMAL LOW (ref 12.0–16.0)

## 2023-01-18 MED ORDER — SODIUM CHLORIDE 0.9 % IV SOLN: INTRAVENOUS | Status: DC

## 2023-01-18 MED ORDER — FENTANYL CITRATE PF 50 MCG/ML IJ SOSY
50.0000 ug | PREFILLED_SYRINGE | Freq: Once | INTRAMUSCULAR | Status: DC
  Filled 2023-01-18: qty 1

## 2023-01-18 MED ORDER — KETAMINE HCL 50 MG/5ML IJ SOSY
1.0000 mg/kg | PREFILLED_SYRINGE | Freq: Once | INTRAMUSCULAR | Status: AC
  Administered 2023-01-18: 76 mg via INTRAVENOUS
  Filled 2023-01-18: qty 10

## 2023-01-18 MED ORDER — LIDOCAINE 5 % EX PTCH
1.0000 | MEDICATED_PATCH | CUTANEOUS | Status: DC
  Administered 2023-01-18 – 2023-02-16 (×29): 1 via TRANSDERMAL
  Filled 2023-01-18 (×29): qty 1

## 2023-01-18 MED ORDER — MIDAZOLAM HCL 2 MG/2ML IJ SOLN
INTRAMUSCULAR | Status: AC
  Administered 2023-01-18: 4 mg
  Filled 2023-01-18: qty 2

## 2023-01-18 MED ORDER — MIDAZOLAM HCL 2 MG/2ML IJ SOLN
2.0000 mg | Freq: Once | INTRAMUSCULAR | Status: AC
  Filled 2023-01-18: qty 2

## 2023-01-18 MED ORDER — POTASSIUM CHLORIDE CRYS ER 20 MEQ PO TBCR
40.0000 meq | EXTENDED_RELEASE_TABLET | Freq: Once | ORAL | Status: AC
  Administered 2023-01-18: 40 meq via ORAL
  Filled 2023-01-18: qty 2

## 2023-01-18 MED ORDER — PHENYLEPHRINE 80 MCG/ML (10ML) SYRINGE FOR IV PUSH (FOR BLOOD PRESSURE SUPPORT): 80.0000 ug | PREFILLED_SYRINGE | Freq: Once | INTRAVENOUS | Status: DC | PRN

## 2023-01-18 NOTE — Progress Notes (Signed)
OT Cancellation Note  Patient Details Name: Natasha Woodard MRN: [id number] DOB: Sep 22, 1941   Cancelled Treatment:    Reason Eval/Treat Not Completed: Patient at procedure or test/ unavailable- TEE. Will follow and check back as able.   Jolaine Artist, OT Acute Rehabilitation Services Office [phone]  Delight Stare 01/18/2023, 9:24 AM

## 2023-01-18 NOTE — Progress Notes (Signed)
  Echocardiogram Echocardiogram Transesophageal bedside has been performed.  Natasha Woodard 01/18/2023, 9:31 AM

## 2023-01-18 NOTE — CV Procedure (Signed)
    TRANSESOPHAGEAL ECHOCARDIOGRAM   NAME:  Natasha Woodard   MRN: [id number] DOB:  August 14, 1941   ADMIT DATE: 01/10/2023  INDICATIONS:  Mitral regurgitation  PROCEDURE:   Informed consent was obtained prior to the procedure. The risks, benefits and alternatives for the procedure were discussed and the patient comprehended these risks.  Risks include, but are not limited to, cough, sore throat, vomiting, nausea, somnolence, esophageal and stomach trauma or perforation, bleeding, low blood pressure, aspiration, pneumonia, infection, trauma to the teeth and death.    After a procedural time-out, the patient was sedated by CCM service. The transesophageal probe was inserted in the esophagus and stomach without difficulty and multiple views were obtained.    COMPLICATIONS:    There were no immediate complications.  FINDINGS:  LEFT VENTRICLE: EF = 30-35%.   RIGHT VENTRICLE: Normal size and function.   LEFT ATRIUM: Moderately dilated  LEFT ATRIAL APPENDAGE: No thrombus.   RIGHT ATRIUM: Ok   AORTIC VALVE:  Trileaflet. Trivial AI  MITRAL VALVE:    posterior leaflet restricted. Moderate MR  TRICUSPID VALVE: Normal. Mild TR  PULMONIC VALVE: Grossly normal. Triv PI   INTERATRIAL SEPTUM: No PFO or ASD.  PERICARDIUM: Small effusion  DESCENDING AORTA: severe plaque   Ivan Lacher,MD 10:04 AM

## 2023-01-18 NOTE — Progress Notes (Signed)
PT Cancellation Note  Patient Details Name: Natasha Woodard MRN: [id number] DOB: 17-Mar-1941   Cancelled Treatment:    Reason Eval/Treat Not Completed: Patient at procedure or test/unavailable. Pt having TEE. Will check back later.   [place] 01/18/2023, 8:59 AM [place] Office [phone]

## 2023-01-18 NOTE — Evaluation (Signed)
Occupational Therapy Evaluation Patient Details Name: Natasha Woodard MRN: [id number] DOB: 11/13/1941 Today's Date: 01/18/2023   History of Present Illness 82 yo female admitted 1/30 with chest pain, NSTEMI s/p cardiac cath and IABP placed. Pt with bleeding and impaired circulation with IABP removed 1/31 and thrombin injection to pseudoaneurysm. 2/4 Rt groin pseudoaneurysm rupture s/p repair with placement of wound VAC. PMhx: CAD, dilated cardiomyopathy, CKD, GERD, HFpEF, HTN, HLD, T2DM   Clinical Impression   PTA patient independent with ADLs and mobility, reports intermittently using cane as needed in community.  Admitted for above and limited by problem list below.  Today, she requires mod assist +2 safety for bed mobility, min-mod assist +2 for transfers using RW and setup to total assist for ADLs.  She fatigues easily but VSS on RA throughout session.  Cueing for safety, problem solving and attention to task, decreased awareness to deficits but highly motivated to return to independence.  Based on performance today, believe she will best benefit from continued OT services acutely and after dc at AIR level to optimize independence, safety and return to pLOF.         Recommendations for follow up therapy are one component of a multi-disciplinary discharge planning process, led by the attending physician.  Recommendations may be updated based on patient status, additional functional criteria and insurance authorization.   Follow Up Recommendations  Acute inpatient rehab (3hours/day)     Assistance Recommended at Discharge Frequent or constant Supervision/Assistance  Patient can return home with the following A lot of help with walking and/or transfers;A lot of help with bathing/dressing/bathroom;Assistance with cooking/housework;Direct supervision/assist for medications management;Direct supervision/assist for financial management;Assist for transportation;Help with stairs or ramp for  entrance    Functional Status Assessment  Patient has had a recent decline in their functional status and demonstrates the ability to make significant improvements in function in a reasonable and predictable amount of time.  Equipment Recommendations  Other (comment) (defer)    Recommendations for Other Services       Precautions / Restrictions Precautions Precautions: Fall Restrictions Weight Bearing Restrictions: No      Mobility Bed Mobility Overal bed mobility: Needs Assistance Bed Mobility: Supine to Sit     Supine to sit: Mod assist, +2 for safety/equipment, HOB elevated     General bed mobility comments: Assist to bring legs off of bed and elevate trunk into sitting. Incr time and effort    Transfers Overall transfer level: Needs assistance Equipment used: Rolling walker (2 wheels) Transfers: Sit to/from Stand, Bed to chair/wheelchair/BSC Sit to Stand: Mod assist, Min assist, +2 safety/equipment     Step pivot transfers: Min assist, +2 safety/equipment     General transfer comment: Initially mod assist to bring hips up and for balance on first stand. On subsequent stands improved to min assist. Bed to [facility] using walker and min assist for balance and support as well as +2 for lines/safety.      Balance Overall balance assessment: Needs assistance Sitting-balance support: Bilateral upper extremity supported, Feet supported Sitting balance-Leahy Scale: Poor Sitting balance - Comments: UE support   Standing balance support: Bilateral upper extremity supported, During functional activity, Reliant on assistive device for balance Standing balance-Leahy Scale: Poor Standing balance comment: walker and min assist for static standing                           ADL either performed or assessed with clinical judgement  ADL Overall ADL's : Needs assistance/impaired     Grooming: Set up;Sitting           Upper Body Dressing : Sitting;Minimal  assistance   Lower Body Dressing: Total assistance;Sit to/from stand;+2 for safety/equipment   Toilet Transfer: Minimal assistance;Moderate assistance;+2 for physical assistance;+2 for safety/equipment;Ambulation;Rolling walker (2 wheels)   Toileting- Clothing Manipulation and Hygiene: Total assistance;+2 for physical assistance;+2 for safety/equipment;Sit to/from stand       Functional mobility during ADLs: Minimal assistance;Moderate assistance;+2 for physical assistance;+2 for safety/equipment;Rolling walker (2 wheels);Cueing for safety       Vision   Vision Assessment?: No apparent visual deficits     Perception     Praxis      Pertinent Vitals/Pain Pain Assessment Pain Assessment: Faces Faces Pain Scale: Hurts little more Pain Location: back Pain Descriptors / Indicators: Grimacing, Guarding Pain Intervention(s): Monitored during session, Limited activity within patient's tolerance, Repositioned     Hand Dominance Right   Extremity/Trunk Assessment Upper Extremity Assessment Upper Extremity Assessment: Generalized weakness   Lower Extremity Assessment Lower Extremity Assessment: Defer to PT evaluation       Communication Communication Communication: No difficulties   Cognition Arousal/Alertness: Awake/alert Behavior During Therapy: WFL for tasks assessed/performed Overall Cognitive Status: Impaired/Different from baseline Area of Impairment: Awareness, Problem solving, Memory, Attention, Safety/judgement                   Current Attention Level: Sustained Memory: Decreased short-term memory   Safety/Judgement: Decreased awareness of safety, Decreased awareness of deficits Awareness: Emergent Problem Solving: Slow processing, Requires verbal cues General Comments: pt oriented and follows commands well, decreased problem solving and awareness noted functionally     General Comments  VSS on RA    Exercises     Shoulder Instructions       Home Living Family/patient expects to be discharged to:: Private residence Living Arrangements: Spouse/significant other Available Help at Discharge: Family Type of Home: House Home Access: Stairs to enter Technical brewer of Steps: 3 Entrance Stairs-Rails:  (+ rail) Home Layout: Two level;Bed/bath upstairs Alternate Level Stairs-Number of Steps: 14   Bathroom Shower/Tub: Teacher, early years/pre: Standard     Home Equipment: Cane - single point          Prior Functioning/Environment Prior Level of Function : Independent/Modified Independent;Driving             Mobility Comments: no AD in home, [place] [facility] [employer], uses cane outside home ADLs Comments: independent ADls, light IADLs, drives and manages meds        OT Problem List: Decreased strength;Decreased activity tolerance;Impaired balance (sitting and/or standing);Pain;Cardiopulmonary status limiting activity;Decreased knowledge of precautions;Decreased knowledge of use of DME or AE      OT Treatment/Interventions: Self-care/ADL training;Therapeutic exercise;DME and/or AE instruction;Therapeutic activities;Balance training;Patient/family education;Energy conservation    OT Goals(Current goals can be found in the care plan section) Acute Rehab OT Goals Patient Stated Goal: home OT Goal Formulation: With patient Time For Goal Achievement: 02/01/23 Potential to Achieve Goals: Good  OT Frequency: Min 2X/week    Co-evaluation PT/OT/SLP Co-Evaluation/Treatment: Yes Reason for Co-Treatment: For patient/therapist safety;To address functional/ADL transfers   OT goals addressed during session: ADL's and self-care      AM-PAC OT "6 Clicks" Daily Activity     Outcome Measure Help from another person eating meals?: A Little Help from another person taking care of personal grooming?: A Little Help from another person toileting, which includes using toliet,  bedpan, or urinal?: Total Help from  another person bathing (including washing, rinsing, drying)?: A Lot Help from another person to put on and taking off regular upper body clothing?: A Little Help from another person to put on and taking off regular lower body clothing?: Total 6 Click Score: 13   End of Session Equipment Utilized During Treatment: Gait belt;Rolling walker (2 wheels) Nurse Communication: Mobility status  Activity Tolerance: Patient tolerated treatment well Patient left: in chair;with call bell/phone within reach;with chair alarm set;with family/visitor present  OT Visit Diagnosis: Other abnormalities of gait and mobility (R26.89);Muscle weakness (generalized) (M62.81);Pain Pain - part of body:  (back)                Time: 0370-4888 OT Time Calculation (min): 38 min Charges:  OT General Charges $OT Visit: 1 Visit OT Evaluation $OT Eval Moderate Complexity: [place], OT Acute Rehabilitation Services Office [place] 01/18/2023, 2:01 PM

## 2023-01-18 NOTE — Procedures (Signed)
Conscious sedation:  Sedation start time 8:55 versed 2mg  IV Ketamine 75mg  Versed 2mg  IV  Continuously monitored oxygen, respiratory status, hemodynamics throughout the procedure. Procedure stop time 9:08 Recovering well, no change in BP, HR, or desaturations during the procedure.  Total time monitoring: 25 min.  Julian Hy, DO 01/18/23 9:17 AM [place] [facility]

## 2023-01-18 NOTE — Progress Notes (Addendum)
Pre-conscious sedation assessment for TEE for MR, HFrEF. No drug allergies.    BP (!) 128/45   Pulse 72   Temp 99.2 F (37.3 C) (Oral)   Resp 19   Ht 5\' 2"  (1.575 m)   Wt 75.9 kg   SpO2 94%   BMI 30.60 kg/m  Breathing comfortably on RA, CTAB Mallampati 4, good mouth opening. ASA 4 Reg rate, irreg rhythm.  Planning on using ketamine, versed. Patient placed on 6L O2 Allergies reviewed.  Patient verbally consented for conscious sedation.  Julian Hy, DO 01/18/23 8:48 AM [place] [facility]

## 2023-01-18 NOTE — Evaluation (Signed)
Physical Therapy Evaluation Patient Details Name: Natasha Woodard MRN: [id number] DOB: 1941-04-05 Today's Date: 01/18/2023  History of Present Illness  82 yo female admitted 1/30 with chest pain, NSTEMI s/p cardiac cath and IABP placed. Pt with bleeding and impaired circulation with IABP removed 1/31 and thrombin injection to pseudoaneurysm. 2/4 Rt groin pseudoaneurysm rupture s/p repair with placement of wound VAC. PMhx: CAD, dilated cardiomyopathy, CKD, GERD, HFpEF, HTN, HLD, T2DM  Clinical Impression  Pt admitted with above diagnosis and presents to PT with functional limitations due to deficits listed below (See PT problem list). Pt needs skilled PT to maximize independence and safety to allow discharge to [facility] for further rehab prior to return home. Pt motivated and typically independent and wants to return to independence. Supportive family as well.         Recommendations for follow up therapy are one component of a multi-disciplinary discharge planning process, led by the attending physician.  Recommendations may be updated based on patient status, additional functional criteria and insurance authorization.  Follow Up Recommendations Acute inpatient rehab (3hours/day)      Assistance Recommended at Discharge Frequent or constant Supervision/Assistance  Patient can return home with the following  A little help with walking and/or transfers;A little help with bathing/dressing/bathroom;Help with stairs or ramp for entrance;Assist for transportation;Assistance with cooking/housework    Equipment Recommendations Rolling walker (2 wheels)  Recommendations for Other Services       Functional Status Assessment Patient has had a recent decline in their functional status and demonstrates the ability to make significant improvements in function in a reasonable and predictable amount of time.     Precautions / Restrictions Precautions Precautions: Fall Restrictions Weight Bearing  Restrictions: No      Mobility  Bed Mobility Overal bed mobility: Needs Assistance Bed Mobility: Supine to Sit     Supine to sit: Mod assist, +2 for safety/equipment, HOB elevated     General bed mobility comments: Assist to bring legs off of bed and elevate trunk into sitting. Incr time and effort    Transfers Overall transfer level: Needs assistance Equipment used: Rolling walker (2 wheels) Transfers: Sit to/from Stand, Bed to chair/wheelchair/BSC Sit to Stand: Mod assist, Min assist, +2 safety/equipment   Step pivot transfers: Min assist, +2 safety/equipment       General transfer comment: Initially mod assist to bring hips up and for balance on first stand. On subsequent stands improved to min assist. Bed to [facility] using walker and min assist for balance and support as well as +2 for lines/safety.    Ambulation/Gait Ambulation/Gait assistance: Min assist, +2 safety/equipment, Mod assist Gait Distance (Feet): 75 Feet Assistive device: Rolling walker (2 wheels) Gait Pattern/deviations: Step-through pattern, Decreased step length - right, Decreased step length - left, Drifts right/left, Trunk flexed Gait velocity: decr Gait velocity interpretation: <1.31 ft/sec, indicative of household ambulator   General Gait Details: Min assist for balance and support with 2nd person for chair follow. Mod assist to steer walker as pt with significant veering lt and unaware of this.  Stairs            Wheelchair Mobility    Modified Rankin (Stroke Patients Only)       Balance Overall balance assessment: Needs assistance Sitting-balance support: Bilateral upper extremity supported, Feet supported Sitting balance-Leahy Scale: Poor Sitting balance - Comments: UE support   Standing balance support: Bilateral upper extremity supported, During functional activity, Reliant on assistive device for balance Standing balance-Leahy Scale: Poor Standing  balance comment: walker and min  assist for static standing                             Pertinent Vitals/Pain Pain Assessment Pain Assessment: Faces Faces Pain Scale: Hurts little more Pain Location: back Pain Descriptors / Indicators: Grimacing, Guarding Pain Intervention(s): Limited activity within patient's tolerance, Monitored during session, Repositioned    Home Living Family/patient expects to be discharged to:: Private residence Living Arrangements: Spouse/significant other Available Help at Discharge: Family Type of Home: House Home Access: Stairs to enter Entrance Stairs-Rails:  (+ rail) Technical brewer of Steps: 3 Alternate Level Stairs-Number of Steps: 14 Home Layout: Two level;Bed/bath upstairs Home Equipment: Cane - single point      Prior Function Prior Level of Function : Independent/Modified Independent;Driving             Mobility Comments: no AD in home, [facility], uses cane outside home ADLs Comments: independent ADls, light IADLs, drives and manages meds     Hand Dominance        Extremity/Trunk Assessment   Upper Extremity Assessment Upper Extremity Assessment: Defer to OT evaluation    Lower Extremity Assessment Lower Extremity Assessment: Generalized weakness       Communication   Communication: No difficulties  Cognition Arousal/Alertness: Awake/alert Behavior During Therapy: WFL for tasks assessed/performed Overall Cognitive Status: Impaired/Different from baseline Area of Impairment: Awareness, Problem solving, Memory                     Memory: Decreased short-term memory     Awareness: Emergent Problem Solving: Slow processing, Requires verbal cues          General Comments General comments (skin integrity, edema, etc.): VSS on RA. Dyspnea 2-3/4    Exercises     Assessment/Plan    PT Assessment Patient needs continued PT services  PT Problem List Decreased strength;Decreased activity tolerance;Decreased  balance;Decreased mobility       PT Treatment Interventions DME instruction;Gait training;Functional mobility training;Stair training;Therapeutic activities;Therapeutic exercise;Balance training;Patient/family education    PT Goals (Current goals can be found in the Care Plan section)  Acute Rehab PT Goals Patient Stated Goal: return home PT Goal Formulation: With patient Time For Goal Achievement: 02/01/23 Potential to Achieve Goals: Good    Frequency Min 3X/week     Co-evaluation               AM-PAC PT "6 Clicks" Mobility  Outcome Measure Help needed turning from your back to your side while in a flat bed without using bedrails?: A Lot Help needed moving from lying on your back to sitting on the side of a flat bed without using bedrails?: A Lot Help needed moving to and from a bed to a chair (including a wheelchair)?: A Lot Help needed standing up from a chair using your arms (e.g., wheelchair or bedside chair)?: A Lot Help needed to walk in hospital room?: A Lot Help needed climbing 3-5 steps with a railing? : Total 6 Click Score: 11    End of Session   Activity Tolerance: Patient tolerated treatment well;Patient limited by fatigue Patient left: in chair;with call bell/phone within reach;with chair alarm set;with family/visitor present Nurse Communication: Mobility status PT Visit Diagnosis: Unsteadiness on feet (R26.81);Other abnormalities of gait and mobility (R26.89);Muscle weakness (generalized) (M62.81)    Time: 9371-6967 PT Time Calculation (min) (ACUTE ONLY): 38 min   Charges:  PT Evaluation $PT Eval Moderate Complexity: 1 [place] [facility] [place] 01/18/2023, 12:52 PM

## 2023-01-18 NOTE — Progress Notes (Addendum)
Advanced Heart Failure Rounding Note  PCP-Cardiologist: Dr. Ellyn Hack AHF: Dr. 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 Now admitted for acute lateral wall MI 2/2 distal LCx occlusion c/b severe ischemic MR and acute systolic heart failure w/ low output, CI 2.1 on RHC. Required placement of IABP, but later removed given concerns for developing limb ischemia=>found to have Right femoral PSA   Subjective:   1/31 Underwent thrombin injection to right femoral PSA . Hgb drifting down 10.3>8.3>6.8  2/1 Started on amio drip to suppress PVCs. Got dose of IV lasix x1. CVP low.  2/4 Ruptured PSA. Returned to OR for repair. VAC in place 2/5 Midodrine 5 mg three times daily added.  Back in A fib.    Norepi 3 mcg + Milrinone 0.125 mcg + amio 30. CO-OX 63.5%   Anxious about TEE.   Objective:   Weight Range: 75.9 kg Body mass index is 30.6 kg/m.   Vital Signs:   Temp:  [97.6 F (36.4 C)-99.4 F (37.4 C)] 99.2 F (37.3 C) (02/07 0813) Pulse Rate:  [42-98] 72 (02/07 0815) Resp:  [13-33] 19 (02/07 0815) BP: (89-134)/(38-84) 128/45 (02/07 0815) SpO2:  [91 %-99 %] 94 % (02/07 0815) Weight:  [75.9 kg] 75.9 kg (02/07 0427) Last BM Date : 01/17/23  Weight change: Filed Weights   01/16/23 0435 01/17/23 0345 01/18/23 0427  Weight: 77.9 kg 74.7 kg 75.9 kg    Intake/Output:   Intake/Output Summary (Last 24 hours) at 01/18/2023 0857 Last data filed at 01/18/2023 0700 Gross per 24 hour  Intake 787.54 ml  Output 1300 ml  Net -512.46 ml    CVP 9-10  Physical Exam  General:  No resp difficulty HEENT: normal Neck: supple. JVP 9-10 . Carotids 2+ bilat; no bruits. No lymphadenopathy or thryomegaly appreciated. Cor: PMI nondisplaced. Regular rate & rhythm. No rubs, gallops. 2/6 MR  Lungs: clear Abdomen: soft, nontender, nondistended. No hepatosplenomegaly. No bruits or masses. Good bowel  sounds. Extremities: no cyanosis, clubbing, rash, edema. R groin VAC. R thigh  Neuro: alert & orientedx3, cranial nerves grossly intact. moves all 4 extremities w/o difficulty. Affect pleasant  Telemetry   In and out of A fib with NSVT/pauses.  Labs    CBC Recent Labs    01/15/23 0950 01/15/23 0952 01/17/23 0335 01/18/23 0359  WBC 10.4   < > 14.0* 11.0*  NEUTROABS 7.4  --   --   --   HGB 9.3*   < > 8.3* 7.5*  HCT 29.2*   < > 26.1* 24.0*  MCV 93.6   < > 92.9 93.0  PLT 256   < > 284 312   < > = values in this interval not displayed.   Basic Metabolic Panel Recent Labs    01/16/23 0418 01/17/23 0335 01/18/23 0359  NA 133* 131* 131*  K 4.0 3.7 3.7  CL 99 98 94*  CO2 23 24 26   GLUCOSE 229* 208* 220*  BUN 16 20 23   CREATININE 1.34* 1.44* 1.58*  CALCIUM 8.0* 8.0* 7.9*  MG 2.0  --   --    Liver Function Tests No results for input(s): "AST", "ALT", "ALKPHOS", "BILITOT", "PROT", "ALBUMIN" in the last 72 hours.  No results for input(s): "LIPASE", "AMYLASE" in the last 72 hours. Cardiac Enzymes No results for input(s): "CKTOTAL", "CKMB", "CKMBINDEX", "TROPONINI" in the last 72 hours.  BNP: BNP (last  3 results) Recent Labs    06/17/22 1000 08/31/22 1613 01/10/23 1048  BNP 1,397.5* 1,178.5* >4,500.0*    ProBNP (last 3 results) Recent Labs    06/09/22 1506  PROBNP 4,638*     D-Dimer No results for input(s): "DDIMER" in the last 72 hours. Hemoglobin A1C No results for input(s): "HGBA1C" in the last 72 hours. Fasting Lipid Panel No results for input(s): "CHOL", "HDL", "LDLCALC", "TRIG", "CHOLHDL", "LDLDIRECT" in the last 72 hours.  Thyroid Function Tests No results for input(s): "TSH", "T4TOTAL", "T3FREE", "THYROIDAB" in the last 72 hours.  Invalid input(s): "FREET3"  Other results:   Imaging    No results found.   Medications:     Scheduled Medications:  sodium chloride   Intravenous Once   acetaminophen  650 mg Oral Q6H   aspirin  81 mg  Oral Daily   Chlorhexidine Gluconate Cloth  6 each Topical Daily   empagliflozin  10 mg Oral Daily   fentaNYL (SUBLIMAZE) injection  100 mcg Intravenous Once   fentaNYL (SUBLIMAZE) injection  25 mcg Intravenous Once   insulin aspart  0-15 Units Subcutaneous TID WC   ketamine (KETALAR) injection 10mg /mL (IV use)  1 mg/kg Intravenous Once   midazolam  2 mg Intravenous Once   midodrine  10 mg Oral TID WC   rosuvastatin  10 mg Oral QHS   sodium chloride flush  10-40 mL Intracatheter Q12H   sodium chloride flush  3 mL Intravenous Q12H   ticagrelor  90 mg Oral BID   torsemide  40 mg Oral Daily    Infusions:  sodium chloride     sodium chloride     amiodarone 30 mg/hr (01/18/23 0700)   milrinone 0.125 mcg/kg/min (01/18/23 0700)   norepinephrine (LEVOPHED) Adult infusion 3 mcg/min (01/18/23 0700)    PRN Medications: sodium chloride, acetaminophen, ipratropium-albuterol, nitroGLYCERIN, ondansetron (ZOFRAN) IV, mouth rinse, oxyCODONE, phenylephrine, sodium chloride flush, sodium chloride flush, traMADol    Assessment/Plan   1. Cardiogenic shock/acute on chronic systolic CHF: Echo this admission with EF 20-25%, mild LV dilation, mild RV dysfunction, severe MR with restricted posterior leaflet.  Echo in 2/23 with EF 50-55%.  Patient has history of nonischemic cardiomyopathy (?PVC-mediated) that improved.  She reports 2 months of dyspnea then had lateral STEMI 1/30 with chest pain.  Per Dr McLean--> He did not  think that her PLOM occlusion can explain the extent of her cardiomyopathy, so suspect EF may have been falling prior to this event (symptomatic at least 2 months).  Suspect mixed ischemic/nonischemic cardiomyopathy.  Infarct-related MR also plays a role (see below).  She initially had IABP for cardiogenic shock, now removed due to right leg ischemia. Lactic acid was elevated at 4.1>1. - Remains on milrinone 0.125 + NE 3  mcg. CO-OX 63.5% -CVP -9-10 . Start torsemide 40 mg daily.  - Will  wean inotropes. Continue midodrine to 10 mg tid.  -No GDMT while on pressors - Renal function stable.  2. Mitral regurgitation: severe MR with restricted posterior leaflet, suspect infarct-related MR with PLOM occlusion.  - Continue milrinone -TEE today. .  3. CAD: Lateral STEMI with occluded pLOM, treated with DES.   - No chest pain.  - Continue ASA 81 - Continue ticagrelor - Continue statin.  4. 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 On milrinone, has had frequent PVCs and NSVT.  - continue amio until off pressors.  5. Right femoral PSA: s/p thrombin injection by Dr. Carlis Abbott.  - 2/4  stat u/s with recurrent PSA with active bleeding into PSA - Vascular Team appreciated. S/P PSA repair with VAC placement.  - Discussed with Vascular ok to ambulate. .  6. AKI on CKD stage 3b - Baseline 1.3-1.5 Peak 1.84 in setting of ATN  - Scr 1.44-->1.58 today  - Resume Jardiance at some point  7. Acute blood loss anemia: Hgb down to 6.8 -> 2u -> 9.2 -> 8.6 -> 9.5->8.4 ->8.3 ->7.5  - Check CBC at 1200  8. Acute Hypoxic Respiratory Failure - resolving. On room air 9. PAF -  In an out of A fib.  - no AC until groin stable    Amy Clegg NP-C  01/18/2023 8:57 AM  Agree with above.   Still on milrinone and NE. Weaning as tolerated. Denies CP or SOB. In Sinus with PACs. Having runs of NSVT. On IV amio. Groin site ok   TEE EF 30-35% moderate MR  General:  Elderly No resp difficulty HEENT: normal Neck: supple. JVP 10 Carotids 2+ bilat; no bruits. No lymphadenopathy or thryomegaly appreciated. Cor: PMI nondisplaced. Irregular rate & rhythm. 2/6 MR Lungs: clear Abdomen: soft, nontender, nondistended. No hepatosplenomegaly. No bruits or masses. Good bowel sounds. Extremities: no cyanosis, clubbing, rash, edema R Groin wound vac Neuro: alert & orientedx3, cranial nerves grossly intact. moves all 4 extremities w/o difficulty. Affect pleasant  Improving slowly. Continue  to wean vasopressors with midodrine support.  TEE with moderate MR -> medical management. Continue IV amio. Will d/w VVS re timing of AC.   PT/OT to see.   CRITICAL CARE Performed by: Glori Bickers  Total critical care time: 35 minutes  Critical care time was exclusive of separately billable procedures and treating other patients.  Critical care was necessary to treat or prevent imminent or life-threatening deterioration.  Critical care was time spent personally by me (independent of midlevel providers or residents) on the following activities: development of treatment plan with patient and/or surrogate as well as nursing, discussions with consultants, evaluation of patient's response to treatment, examination of patient, obtaining history from patient or surrogate, ordering and performing treatments and interventions, ordering and review of laboratory studies, ordering and review of radiographic studies, pulse oximetry and re-evaluation of patient's condition.  Glori Bickers, MD  10:08 AM

## 2023-01-18 NOTE — Progress Notes (Signed)
  Progress Note    01/18/2023 8:24 AM 3 Days Post-Op  Subjective:  right groin feeling okay    Vitals:   01/18/23 0651 01/18/23 0813  BP: (!) 114/52   Pulse: (!) 55   Resp:    Temp:  99.2 F (37.3 C)  SpO2: 97%     Physical Exam: Lungs:  nonlabored Incisions:  R groin still tender with prevena Extremities:  BLE well perfused   CBC    Component Value Date/Time   WBC 11.0 (H) 01/18/2023 0359   RBC 2.58 (L) 01/18/2023 0359   HGB 7.5 (L) 01/18/2023 0359   HGB 14.2 10/09/2020 0935   HCT 24.0 (L) 01/18/2023 0359   HCT 44.9 10/09/2020 0935   PLT 312 01/18/2023 0359   PLT 379 10/09/2020 0935   MCV 93.0 01/18/2023 0359   MCV 91 10/09/2020 0935   MCH 29.1 01/18/2023 0359   MCHC 31.3 01/18/2023 0359   RDW 14.7 01/18/2023 0359   RDW 13.4 10/09/2020 0935   LYMPHSABS 0.9 01/15/2023 0950   LYMPHSABS 1.1 10/09/2020 0935   MONOABS 1.4 (H) 01/15/2023 0950   EOSABS 0.6 (H) 01/15/2023 0950   EOSABS 0.2 10/09/2020 0935   BASOSABS 0.1 01/15/2023 0950   BASOSABS 0.1 10/09/2020 0935    BMET    Component Value Date/Time   NA 131 (L) 01/18/2023 0359   NA 145 (H) 07/07/2022 1007   K 3.7 01/18/2023 0359   CL 94 (L) 01/18/2023 0359   CO2 26 01/18/2023 0359   GLUCOSE 220 (H) 01/18/2023 0359   BUN 23 01/18/2023 0359   BUN 24 07/07/2022 1007   CREATININE 1.58 (H) 01/18/2023 0359   CREATININE 1.28 (H) 01/14/2022 1551   CALCIUM 7.9 (L) 01/18/2023 0359   GFRNONAA 32 (L) 01/18/2023 0359   GFRAA 49 (L) 10/09/2020 0933    INR    Component Value Date/Time   INR 1.4 (H) 01/10/2023 2145     Intake/Output Summary (Last 24 hours) at 01/18/2023 0824 Last data filed at 01/18/2023 0700 Gross per 24 hour  Intake 787.54 ml  Output 1300 ml  Net -512.46 ml      Assessment/Plan:  82 y.o. female is 3 days post op,s/p: repair of bleeding R groin pseudoaneurysm   -R groin still tender to touch but improving. Prevena in place with good seal. Potentially will remove in 4 days -BLE warm  and well perfused. No BLE pain or numbness -Was able to ambulate yesterday to the hallway and back  Vicente Serene, PA-C [facility] [phone] 01/18/2023 8:24 AM

## 2023-01-18 NOTE — Progress Notes (Addendum)
Inpatient Rehab Admissions Coordinator:   Per therapy recommendations,  patient was screened for CIR candidacy by Shawnda Mauney, MS, CCC-SLP . At this time, Pt. Appears to be a a potential candidate for CIR. I will request   order for rehab consult per protocol for full assessment. Please contact me any with questions.  Natasha Petteway, MS, CCC-SLP Rehab Admissions Coordinator  [phone] (celll) [phone] (office)  

## 2023-01-19 ENCOUNTER — Inpatient Hospital Stay (HOSPITAL_COMMUNITY)
Admission: EM | Disposition: A | Discharge status: Rehabilitation Facility | Payer: Self-pay | Source: Home / Self Care | Attending: Cardiology

## 2023-01-19 DIAGNOSIS — I2510 Atherosclerotic heart disease of native coronary artery without angina pectoris: Secondary | ICD-10-CM | POA: Diagnosis not present

## 2023-01-19 DIAGNOSIS — R570 Cardiogenic shock: Secondary | ICD-10-CM | POA: Diagnosis not present

## 2023-01-19 DIAGNOSIS — I4721 Torsades de pointes: Secondary | ICD-10-CM | POA: Diagnosis not present

## 2023-01-19 DIAGNOSIS — I5023 Acute on chronic systolic (congestive) heart failure: Secondary | ICD-10-CM | POA: Diagnosis not present

## 2023-01-19 DIAGNOSIS — I214 Non-ST elevation (NSTEMI) myocardial infarction: Secondary | ICD-10-CM | POA: Diagnosis not present

## 2023-01-19 HISTORY — PX: TEMPORARY PACEMAKER: CATH118268

## 2023-01-19 LAB — POCT I-STAT EG7
Acid-base deficit: 1 mmol/L (ref 0.0–2.0)
Bicarbonate: 24.4 mmol/L (ref 20.0–28.0)
Calcium, Ion: 1.13 mmol/L — ABNORMAL LOW (ref 1.15–1.40)
HCT: 25 % — ABNORMAL LOW (ref 36.0–46.0)
Hemoglobin: 8.5 g/dL — ABNORMAL LOW (ref 12.0–15.0)
O2 Saturation: 55 %
Potassium: 4.4 mmol/L (ref 3.5–5.1)
Sodium: 129 mmol/L — ABNORMAL LOW (ref 135–145)
TCO2: 26 mmol/L (ref 22–32)
pCO2, Ven: 43.2 mmHg — ABNORMAL LOW (ref 44–60)
pH, Ven: 7.36 (ref 7.25–7.43)
pO2, Ven: 30 mmHg — CL (ref 32–45)

## 2023-01-19 LAB — COMPREHENSIVE METABOLIC PANEL
ALT: 31 U/L (ref 0–44)
AST: 86 U/L — ABNORMAL HIGH (ref 15–41)
Albumin: 2.2 g/dL — ABNORMAL LOW (ref 3.5–5.0)
Alkaline Phosphatase: 52 U/L (ref 38–126)
Anion gap: 12 (ref 5–15)
BUN: 30 mg/dL — ABNORMAL HIGH (ref 8–23)
CO2: 24 mmol/L (ref 22–32)
Calcium: 8.3 mg/dL — ABNORMAL LOW (ref 8.9–10.3)
Chloride: 94 mmol/L — ABNORMAL LOW (ref 98–111)
Creatinine, Ser: 1.71 mg/dL — ABNORMAL HIGH (ref 0.44–1.00)
GFR, Estimated: 30 mL/min — ABNORMAL LOW (ref >60)
Glucose, Bld: 223 mg/dL — ABNORMAL HIGH (ref 70–99)
Potassium: 4.4 mmol/L (ref 3.5–5.1)
Sodium: 130 mmol/L — ABNORMAL LOW (ref 135–145)
Total Bilirubin: 1.7 mg/dL — ABNORMAL HIGH (ref 0.3–1.2)
Total Protein: 5 g/dL — ABNORMAL LOW (ref 6.5–8.1)

## 2023-01-19 LAB — GLUCOSE, CAPILLARY
Glucose-Capillary: 131 mg/dL — ABNORMAL HIGH (ref 70–99)
Glucose-Capillary: 141 mg/dL — ABNORMAL HIGH (ref 70–99)
Glucose-Capillary: 126 mg/dL — ABNORMAL HIGH (ref 70–99)
Glucose-Capillary: 135 mg/dL — ABNORMAL HIGH (ref 70–99)
Glucose-Capillary: 88 mg/dL (ref 70–99)
Glucose-Capillary: 179 mg/dL — ABNORMAL HIGH (ref 70–99)

## 2023-01-19 LAB — CBC
HCT: 26.2 % — ABNORMAL LOW (ref 36.0–46.0)
Hemoglobin: 8.4 g/dL — ABNORMAL LOW (ref 12.0–15.0)
MCH: 29.6 pg (ref 26.0–34.0)
MCHC: 32.1 g/dL (ref 30.0–36.0)
MCV: 92.3 fL (ref 80.0–100.0)
Platelets: 438 10*3/uL — ABNORMAL HIGH (ref 150–400)
RBC: 2.84 MIL/uL — ABNORMAL LOW (ref 3.87–5.11)
RDW: 14.9 % (ref 11.5–15.5)
WBC: 10.9 10*3/uL — ABNORMAL HIGH (ref 4.0–10.5)
nRBC: 0.2 % (ref 0.0–0.2)

## 2023-01-19 LAB — COOXEMETRY PANEL
Carboxyhemoglobin: 2.2 % — ABNORMAL HIGH (ref 0.5–1.5)
Methemoglobin: 1.2 % (ref 0.0–1.5)
O2 Saturation: 66 %
Total hemoglobin: 8.5 g/dL — ABNORMAL LOW (ref 12.0–16.0)

## 2023-01-19 LAB — MAGNESIUM: Magnesium: 2.1 mg/dL (ref 1.7–2.4)

## 2023-01-19 LAB — LACTIC ACID, PLASMA: Lactic Acid, Venous: 3 mmol/L (ref 0.5–1.9)

## 2023-01-19 SURGERY — TEMPORARY PACEMAKER
Anesthesia: LOCAL

## 2023-01-19 MED ORDER — HEPARIN (PORCINE) IN NACL 1000-0.9 UT/500ML-% IV SOLN
INTRAVENOUS | Status: AC
  Filled 2023-01-19: qty 500

## 2023-01-19 MED ORDER — FENTANYL CITRATE (PF) 100 MCG/2ML IJ SOLN
INTRAMUSCULAR | Status: AC
  Filled 2023-01-19: qty 2

## 2023-01-19 MED ORDER — FENTANYL CITRATE (PF) 100 MCG/2ML IJ SOLN
INTRAMUSCULAR | Status: DC | PRN
  Administered 2023-01-19: 25 ug via INTRAVENOUS

## 2023-01-19 MED ORDER — LIDOCAINE HCL (PF) 1 % IJ SOLN
INTRAMUSCULAR | Status: DC | PRN
  Administered 2023-01-19: 2 mL via INTRADERMAL

## 2023-01-19 MED ORDER — APIXABAN 2.5 MG PO TABS: 2.5000 mg | ORAL_TABLET | Freq: Two times a day (BID) | ORAL | Status: DC

## 2023-01-19 MED ORDER — CLOPIDOGREL BISULFATE 300 MG PO TABS
300.0000 mg | ORAL_TABLET | Freq: Once | ORAL | Status: AC
  Administered 2023-01-19: 300 mg via ORAL
  Filled 2023-01-19: qty 1

## 2023-01-19 MED ORDER — LIDOCAINE HCL (PF) 1 % IJ SOLN
INTRAMUSCULAR | Status: AC
  Filled 2023-01-19: qty 30

## 2023-01-19 MED ORDER — LIDOCAINE HCL (CARDIAC) PF 100 MG/5ML IV SOSY
50.0000 mg | PREFILLED_SYRINGE | Freq: Once | INTRAVENOUS | Status: AC
  Administered 2023-01-19: 50 mg via INTRAVENOUS
  Filled 2023-01-19: qty 5

## 2023-01-19 MED ORDER — LIDOCAINE 5 % EX PTCH
1.0000 | MEDICATED_PATCH | CUTANEOUS | Status: DC
  Administered 2023-01-19 – 2023-02-18 (×27): 1 via TRANSDERMAL
  Filled 2023-01-19 (×27): qty 1

## 2023-01-19 MED ORDER — MIDAZOLAM HCL 2 MG/2ML IJ SOLN
INTRAMUSCULAR | Status: AC
  Filled 2023-01-19: qty 2

## 2023-01-19 MED ORDER — CLOPIDOGREL BISULFATE 75 MG PO TABS
75.0000 mg | ORAL_TABLET | Freq: Every day | ORAL | Status: DC
  Administered 2023-01-20 – 2023-01-30 (×11): 75 mg via ORAL
  Filled 2023-01-19 (×11): qty 1

## 2023-01-19 MED ORDER — HEPARIN (PORCINE) 25000 UT/250ML-% IV SOLN: 500.0000 [IU]/h | INTRAVENOUS | Status: DC

## 2023-01-19 MED ORDER — MIDAZOLAM HCL 2 MG/2ML IJ SOLN
INTRAMUSCULAR | Status: DC | PRN
  Administered 2023-01-19: 1 mg via INTRAVENOUS

## 2023-01-19 MED ORDER — HEPARIN (PORCINE) 25000 UT/250ML-% IV SOLN
500.0000 [IU]/h | INTRAVENOUS | Status: DC
  Administered 2023-01-19: 500 [IU]/h via INTRAVENOUS
  Filled 2023-01-19: qty 250

## 2023-01-19 MED ORDER — LIDOCAINE IN D5W 4-5 MG/ML-% IV SOLN
1.0000 mg/min | INTRAVENOUS | Status: DC
  Administered 2023-01-19: 1 mg/min via INTRAVENOUS

## 2023-01-19 SURGICAL SUPPLY — 9 items
CABLE ADAPT PACING TEMP 12FT (ADAPTER) IMPLANT
GLIDESHEATH SLEND SS 6F .021 (SHEATH) IMPLANT
GUIDEWIRE INQWIRE 1.5J.035X260 (WIRE) IMPLANT
INQWIRE 1.5J .035X260CM (WIRE) ×1
SHEATH PINNACLE 6F 10CM (SHEATH) IMPLANT
SHEATH PROBE COVER 6X72 (BAG) IMPLANT
SLEEVE REPOSITIONING LENGTH 30 (MISCELLANEOUS) IMPLANT
WIRE MICRO SET SILHO 5FR 7 (SHEATH) IMPLANT
WIRE PACING TEMP ST TIP 5 (CATHETERS) IMPLANT

## 2023-01-19 NOTE — Interval H&P Note (Signed)
History and Physical Interval Note:  01/19/2023 12:45 PM  Natasha Woodard  has presented today for surgery, with the diagnosis of cardiac arrest.  The various methods of treatment have been discussed with the patient and family. After consideration of risks, benefits and other options for treatment, the patient has consented to  Procedure(s): TEMPORARY PACEMAKER (N/A) as a surgical intervention.  The patient's history has been reviewed, patient examined, no change in status, stable for surgery.  I have reviewed the patient's chart and labs.  Questions were answered to the patient's satisfaction.     Bonham Zingale

## 2023-01-19 NOTE — Progress Notes (Addendum)
  Advanced Heart Failure Rounding Note  PCP-Cardiologist: Dr. Harding AHF: Dr. Kayly Kriegel   Patient Profile    82 y/o female w/ prior h/o nonischemic CM that was PVC mediated, EF recovered w/ suppression of PVCs. Now admitted for acute lateral wall MI 2/2 distal LCx occlusion c/b severe ischemic MR and acute systolic heart failure w/ low output, CI 2.1 on RHC. Required placement of IABP, but later removed given concerns for developing limb ischemia=>found to have Right femoral PSA   Subjective:   1/31 Underwent thrombin injection to right femoral PSA . Hgb drifting down 10.3>8.3>6.8  2/1 Started on amio drip to suppress PVCs. Got dose of IV lasix x1. CVP low.  2/4 Ruptured PSA. Returned to OR for repair. VAC in place 2/5 Midodrine 5 mg three times daily added.  Back in A fib.  2/7 TEE with mod MR.  2/8 VT arrest--> shock x1 ROSC after 2 minutes. Given lidocaine and amio was increased to 60 mg.   Norepi 3 mcg + Milrinone 0.125 mcg + amio 60. CO-OX 66  Lactic acid 3  Complaining of fatigue.   Objective:   Weight Range: 75.5 kg Body mass index is 30.44 kg/m.   Vital Signs:   Temp:  [97.3 F (36.3 C)-99.2 F (37.3 C)] 97.8 F (36.6 C) (02/07 2300) Pulse Rate:  [37-130] 52 (02/08 0600) Resp:  [12-43] 33 (02/08 0600) BP: (90-155)/(28-104) 128/47 (02/08 0600) SpO2:  [89 %-100 %] 98 % (02/08 0600) Weight:  [75.5 kg] 75.5 kg (02/08 0500) Last BM Date : 01/18/23  Weight change: Filed Weights   01/17/23 0345 01/18/23 0427 01/19/23 0500  Weight: 74.7 kg 75.9 kg 75.5 kg    Intake/Output:   Intake/Output Summary (Last 24 hours) at 01/19/2023 0739 Last data filed at 01/19/2023 0600 Gross per 24 hour  Intake 1278.55 ml  Output 1725 ml  Net -446.45 ml     Physical Exam  General: No resp difficulty HEENT: normal Neck: supple. JVP 7-8 . Carotids 2+ bilat; no bruits. No lymphadenopathy or thryomegaly appreciated. Cor: PMI nondisplaced. Regular rate & rhythm. No rubs, gallops  or murmurs. Lungs: clear Abdomen: soft, nontender, nondistended. No hepatosplenomegaly. No bruits or masses. Good bowel sounds. Extremities: no cyanosis, clubbing, rash, edema.  R groin prevena VAC.  Neuro: alert & orientedx3, cranial nerves grossly intact. moves all 4 extremities w/o difficulty. Affect pleasant  Telemetry  Junctional Rhythm with frequent PVCs/NSVT 40-50s  Labs    CBC Recent Labs    01/18/23 1525 01/19/23 0220 01/19/23 0228  WBC 10.7* 10.9*  --   HGB 8.7* 8.4* 8.5*  HCT 26.5* 26.2* 25.0*  MCV 91.1 92.3  --   PLT 396 438*  --    Basic Metabolic Panel Recent Labs    01/18/23 0359 01/19/23 0220 01/19/23 0228  NA 131* 130* 129*  K 3.7 4.4 4.4  CL 94* 94*  --   CO2 26 24  --   GLUCOSE 220* 223*  --   BUN 23 30*  --   CREATININE 1.58* 1.71*  --   CALCIUM 7.9* 8.3*  --   MG  --  2.1  --    Liver Function Tests Recent Labs    01/19/23 0220  AST 86*  ALT 31  ALKPHOS 52  BILITOT 1.7*  PROT 5.0*  ALBUMIN 2.2*    No results for input(s): "LIPASE", "AMYLASE" in the last 72 hours. Cardiac Enzymes No results for input(s): "CKTOTAL", "CKMB", "CKMBINDEX", "TROPONINI" in the last 72   hours.  BNP: BNP (last 3 results) Recent Labs    06/17/22 1000 08/31/22 1613 01/10/23 1048  BNP 1,397.5* 1,178.5* >4,500.0*    ProBNP (last 3 results) Recent Labs    06/09/22 1506  PROBNP 4,638*     D-Dimer No results for input(s): "DDIMER" in the last 72 hours. Hemoglobin A1C No results for input(s): "HGBA1C" in the last 72 hours. Fasting Lipid Panel No results for input(s): "CHOL", "HDL", "LDLCALC", "TRIG", "CHOLHDL", "LDLDIRECT" in the last 72 hours.  Thyroid Function Tests No results for input(s): "TSH", "T4TOTAL", "T3FREE", "THYROIDAB" in the last 72 hours.  Invalid input(s): "FREET3"  Other results:   Imaging    No results found.   Medications:     Scheduled Medications:  sodium chloride   Intravenous Once   acetaminophen  650 mg Oral  Q6H   aspirin  81 mg Oral Daily   Chlorhexidine Gluconate Cloth  6 each Topical Daily   empagliflozin  10 mg Oral Daily   fentaNYL (SUBLIMAZE) injection  50 mcg Intravenous Once   insulin aspart  0-15 Units Subcutaneous TID WC   lidocaine  1 patch Transdermal Q24H   midodrine  10 mg Oral TID WC   rosuvastatin  10 mg Oral QHS   sodium chloride flush  10-40 mL Intracatheter Q12H   sodium chloride flush  3 mL Intravenous Q12H   ticagrelor  90 mg Oral BID   torsemide  40 mg Oral Daily    Infusions:  sodium chloride     sodium chloride     amiodarone 60 mg/hr (01/19/23 0600)   milrinone 0.125 mcg/kg/min (01/19/23 0600)   norepinephrine (LEVOPHED) Adult infusion 3 mcg/min (01/19/23 0600)    PRN Medications: sodium chloride, acetaminophen, ipratropium-albuterol, nitroGLYCERIN, ondansetron (ZOFRAN) IV, mouth rinse, oxyCODONE, phenylephrine, sodium chloride flush, sodium chloride flush, traMADol    Assessment/Plan   1. Cardiogenic shock/acute on chronic systolic CHF: Echo this admission with EF 20-25%, mild LV dilation, mild RV dysfunction, severe MR with restricted posterior leaflet.  Echo in 2/23 with EF 50-55%.  Patient has history of nonischemic cardiomyopathy (?PVC-mediated) that improved.  She reports 2 months of dyspnea then had lateral STEMI 1/30 with chest pain.  Per Dr McLean--> He did not  think that her PLOM occlusion can explain the extent of her cardiomyopathy, so suspect EF may have been falling prior to this event (symptomatic at least 2 months).  Suspect mixed ischemic/nonischemic cardiomyopathy.  Infarct-related MR also plays a role (see below).  She initially had IABP for cardiogenic shock, now removed due to right leg ischemia.  - Lactic acid  4.1>1>3  VT arrest on 01/18/23.  - Remains on milrinone 0.125 + NE 3  mcg. CO-OX 66% . Stop milrinone.  - CVP 8-9. Continue torsemide 40 mg daily.  - Continue midodrine to 10 mg tid.  -No GDMT while on pressors - Renal function  stable.  2. Mitral regurgitation: severe MR with restricted posterior leaflet, suspect infarct-related MR with PLOM occlusion.  -TEE --Moderate MR. Follow.  3. CAD: Lateral STEMI with occluded pLOM, treated with DES.   - No chest pain.  - Continue ASA 81 - Switch from Brillinta to plavix.  - Continue statin.  4. 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 Stop milrinone.  - continue amio drip and adding lidocaine drip  5. Right femoral PSA: s/p thrombin injection by Dr. Clark.  - 2/4 stat u/s with recurrent PSA with active bleeding into PSA - Vascular Team   appreciated. S/P PSA repair with VAC placement.  - Discussed with Vascular ok to ambulate.  6. AKI on CKD stage 3b - Baseline 1.3-1.5 Peak 1.84 in setting of ATN  - Scr 1.44-->1.58 -->1.7 today  - Resume Jardiance at some point  7. Acute blood loss anemia: Hgb down to 6.8 -> 2u -> 9.2 -> 8.6 -> 9.5->8.4 ->8.3 ->7.5  - Check CBC at 1200 8. Acute Hypoxic Respiratory Failure - resolving. On room air 9. PAF -  In an out of A fib.  -Adding heparin drip.   10. VT Arrest 2/7 with DC-CV x1 + lidocaine. ROSC after 2 minutes - Continues to have frequent ectopy. - Consult EP ICD consideration.  -Continue heparin drip.    Amy Clegg NP-C  01/19/2023 7:39 AM  Agree with above.  Had R-on-T phenomenon last night in setting of sinus brady. Let to torsades -> VF.  Arrested. Got CPR and shock. Continues with frequent multifocal PVCs.   CVP 8-9. Lactate 3.0  TEE EF 30-35% mild to moderate MR  General:  Sitting up in bed. No resp difficulty HEENT: normal Neck: supple. JVP 8-9. Carotids 2+ bilat; no bruits. No lymphadenopathy or thryomegaly appreciated. Cor: PMI nondisplaced. Regular rate & rhythm. 2/6 MR Lungs: clear Abdomen: soft, nontender, nondistended. No hepatosplenomegaly. No bruits or masses. Good bowel sounds. Extremities: no cyanosis, clubbing, rash, edema  RFA praveena ok  Neuro: alert & orientedx3,  cranial nerves grossly intact. moves all 4 extremities w/o difficulty. Affect pleasant  Have discussed with EP. Will place TVP for overdrive pacing. Continue IV amio for now. Stop milrinone.   D/w VVS ok for [facility] so will start low-dose heparin.  CRITICAL CARE Performed by: Glori Bickers  Total critical care time: 55 minutes  Critical care time was exclusive of separately billable procedures and treating other patients.  Critical care was necessary to treat or prevent imminent or life-threatening deterioration.  Critical care was time spent personally by me (independent of midlevel providers or residents) on the following activities: development of treatment plan with patient and/or surrogate as well as nursing, discussions with consultants, evaluation of patient's response to treatment, examination of patient, obtaining history from patient or surrogate, ordering and performing treatments and interventions, ordering and review of laboratory studies, ordering and review of radiographic studies, pulse oximetry and re-evaluation of patient's condition.  Glori Bickers, MD  12:42 PM

## 2023-01-19 NOTE — Progress Notes (Signed)
Pt seen and examined this morning.  Right foot with signals.  Lots of questions regarding last night.  Family does not understand the gravity of her condition.  I repeatedly states she is critical and could die. This will need to reinforced continuously.  No changes to right leg.  If seal breaks, call and the dressing can be reinforced.    Broadus John MD

## 2023-01-19 NOTE — Consult Note (Addendum)
Cardiology Consultation   Patient ID: Natasha Woodard MRN: [id number]; DOB: Aug 20, 1941  Admit date: 01/10/2023 Date of Consult: 01/19/2023  PCP:  [name], MD   [facility] Providers Cardiologist:  None     Patient Profile:   Natasha Woodard is a 82 y.o. 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 Gala Romney.  History of Present Illness:   Natasha Woodard was admitted 01/10/23 with progressive DOE, and CP, Initial evaluation showed troponin at 32 which rose to 2363. She reports currently that her chest pain has improved however her shortness of breath has worsened significantly. She was given IV Lasix in the emergency department. A limited bedside echo was performed stat which indicates marked reduction in LVEF probably around 20-25% with regional wall motion abnormalities including severe inferolateral hypokinesis and severe, eccentric MR. The MR appears ischemic. Her EKG has shown evolution   She was taken for urgent cath Successful PCI to the distal circumflex with ultimate insertion of a 2.25 x 22 mm Onyx frontier stent dilated with stent taper from 2.27-2.2 distally. Insertion of intra-aortic balloon pump the right femoral artery in this patient with severe LV dysfunction and acute mitral regurgitation probably contributed by papillary muscle dysfunction.. Swan-Ganz catheterization demonstrating severe pulmonary hypertension with PA pressure 74/35, mean 48.   ASA, Ticagrelor   Overnight developed  expanding hematoma surrounding IABP access site + developed s/o limb ischemia with cold foot/shin and absent pulses. IABP removed.  Developed R femoral PSA,  Thrombin injection 01/11/23  HF team on board with shock,  and started on milrinone/norepi NSVTs intermittently    Amio gtt 01/12/23  In review of  record, extent of CM, not felt entirely explained by her CAD/acute event  01/13/23 Anemic > PRBC  01/14/23, PAFib   01/15/23: ruptured PSA > OR for repair, wound vac placed  01/16/23, midodrine added > still on milrinone and norepi  01/18/23 More AFib, planned for TEE LEFT VENTRICLE: EF = 30-35%.  RIGHT VENTRICLE: Normal size and function.  LEFT ATRIUM: Moderately dilated LEFT ATRIAL APPENDAGE: No thrombus.  RIGHT ATRIUM: Ok  AORTIC VALVE:  Trileaflet. Trivial AI MITRAL VALVE:    posterior leaflet restricted. Moderate MR TRICUSPID VALVE: Normal. Mild TR PULMONIC VALVE: Grossly normal. Triv PI  INTERATRIAL SEPTUM: No PFO or ASD. PERICARDIUM: Small effusion DESCENDING AORTA: severe plaque  Overnight/early this morning had R on T > torsades > VT Got CPR, + shock  RN charted note reports  amio increased from 30 > 60 Lido bolus of 150 > gtt (though I dont see a charted bolus)  Milrinone stopped this morning Remains on norepi  Amio is running at 30  EP called with frequent PVCs and frequent NSVTs  Dr. Lalla Brothers and I to bedside, pt denies CP, SOB currently  LABS K+ 4.4 and 4.4 BUN/Creat 30/1.71 (up some) Mag 2.1 WBC 10.9 Hgb 8.4 and 8.5 (stable) Hct 26.2 and 25 Plts 438    Past Medical History:  Diagnosis Date   Calcific tendonitis 2008   Treatment of left leg   Cardiomyopathy (HCC) 06/2017   a) Echo: EF 25-30%. GR 1-2 DD w/ elevated LVEDP. Mild valvular Dz; b) Cardiac MRI 2/'19: frequent PVCs (diffiuclt to interpret) - EF ~27% w/ diffuse HK.  No evidence of infarct, infiltrative Dz or myocarditis. -- ?  if related to PVCs. c) f/u Echo 6/'19: EF 35-40%. Gr 1 DD. Diffuse HK.-> d) Jan 2020 EF 50 to 55%.  Mod MAC, mod LAE.  Ao Sclerosis; e) Echo 1/'21 - EF 60-65%, Gr II DD. Mild Mod MR.    Chronic combined systolic and diastolic CHF, NYHA class 2 and ACA/AHA stage C    Now essentially resolved, back to simply diastolic CHF   Coronary artery disease, non-occlusive     mild-moderate CAD 06/30/17 cath   CTS (carpal tunnel syndrome)    Diabetes mellitus    Type II   Dysrhythmia    patient said that she cant remember what it is   Frequent unifocal PVCs 01/2018   Event Monitor: NSR, W/ frequent multifocal PVCs (9%-down from 30% prior to amiodarone) and PACs.  Nighttime bradycardia suggestive of OSA.';  Zio patch September 2023: PVC burden now 6.1%.   Hiatal hernia    with reflux   Hyperlipidemia    Statin intolerant   Hypertension    Ichthyosis congenita    Intermittent claudication of both lower extremities due to atherosclerosis ([place]) 08/22/2021   LEA Dopplers 09/09/2021:  Right: Total occlusion noted in the superficial femoral artery. Atherosclerosis noted throughout extremity, see note above. Three vessel runoff.  Left: Total occlusion noted in the superficial femoral artery and/or popliteal artery. Total occlusion noted in the distal anterior tibial artery. Athereosclerosis noted throughout extremity.    NSVD (normal spontaneous vaginal delivery) 1971 & 1972   Obesity    Plantar fasciitis    Left   Pulmonary nodule    Imaged multiple times and benign appearing   Wears glasses     Past Surgical History:  Procedure Laterality Date   ABDOMINAL HYSTERECTOMY  1975   TAH-BSO   ABSCESS DRAINAGE  1972   right breast   APPLICATION OF WOUND VAC Right 01/15/2023   Procedure: APPLICATION OF INCISIONAL WOUND VAC;  Surgeon: Broadus John, MD;  Location: [place];  Service: Vascular;  Laterality: Right;   ARTERY REPAIR Right 01/15/2023   Procedure: RIGHT PROFUNDA ARTERY REPAIR WITH 2000G MYRIAD MORCELLS;  Surgeon: Broadus John, MD;  Location: [place];  Service: Vascular;  Laterality: Right;   BREAST BIOPSY Left 01/14/2009   Stereo- Benign   CARDIAC MRI  01/2018   Difficult to interpret 2/2 PVCs. Normal LV size - EF ~27% with diffuse HK. Mild RV dilation - normal fxn. -- NO MYOCARDIAL LGI => no definitive evidence of prior MI, Infiltrative Dz or Myocarditis --  suspect NICM, possibly related to PVCs.   CARPAL TUNNEL RELEASE Right 07/02/2014   Procedure: RIGHT CARPAL TUNNEL RELEASE;  Surgeon: Wynonia Sours, MD;  Location: [place];  Service: Orthopedics;  Laterality: Right;   CARPAL TUNNEL RELEASE Left 06/11/2015   Procedure: LEFT CARPAL TUNNEL RELEASE;  Surgeon: Daryll Brod, MD;  Location: [place];  Service: Orthopedics;  Laterality: Left;  REGIONAL/FAB   COLONOSCOPY     corn removal  1968   right   CORONARY STENT INTERVENTION N/A 01/10/2023   Procedure: CORONARY STENT INTERVENTION;  Surgeon: Troy Sine, MD;  Location: [place] [facility];  Service: Cardiovascular;  Laterality: N/A;   HEMATOMA EVACUATION Right 01/15/2023   Procedure: EVACUATION HEMATOMA RIGHT GROIN;  Surgeon: Broadus John, MD;  Location: [place];  Service: Vascular;  Laterality: Right;   Holter Monitor  06/2017   ~17,000 PVC beats - majority were singlets, some couplets. 44 brief 3-7 beat runs of  NSVT. Also noted were less frequent PACs with 11 runs (longest 15 beats)   IABP INSERTION Right 01/10/2023   Procedure: IABP Insertion;  Surgeon: Troy Sine, MD;  Location: [place] [facility];  Service: Cardiovascular;  Laterality: Right;   OPEN REDUCTION INTERNAL FIXATION (ORIF) DISTAL RADIAL FRACTURE Left 07/21/2017   Procedure: OPEN REDUCTION INTERNAL FIXATION (ORIF) LEFT DISTAL RADIAL FRACTURE;  Surgeon: Renette Butters, MD;  Location: [place];  Service: Orthopedics;  Laterality: Left;   RIGHT/LEFT HEART CATH AND CORONARY ANGIOGRAPHY N/A 06/30/2017   Procedure: Right/Left Heart Cath and Coronary Angiography;  Surgeon: Leonie Man, MD;  Location: [facility] INVASIVE CV LAB:  pRCA 55%, pCx 40%, OM1 45%, mCx 50%, D2 50% - LVEF 25-35%. Moderately elevated LVEDP(26 mmHg with PCWP 16 mmHg).  FICK CO/CI: 4.47/2.48. PA pressures 47/14 mmHg with a mean of 27 mm.   RIGHT/LEFT HEART CATH AND CORONARY ANGIOGRAPHY N/A 01/10/2023   Procedure: RIGHT/LEFT HEART CATH AND  CORONARY ANGIOGRAPHY;  Surgeon: Troy Sine, MD;  Location: [place] [facility];  Service: Cardiovascular;  Laterality: N/A;   ROTATOR CUFF REPAIR     left shoulder   TRANSTHORACIC ECHOCARDIOGRAM  06/2017   EF 25 and 30%. GR 1 DD. Mild diastolic dysfunction with elevated LVEDP. Mild valvular disease.   TRANSTHORACIC ECHOCARDIOGRAM  11'18, 6/'19    a) EF remains 30-35%.  Diffuse hypokinesis noted.  Severe LA dilation; b) Improved EF 35-40%.  Diffuse HK.  GR 1 DD.  Moderate TR.  Mild RV dilation.   TRANSTHORACIC ECHOCARDIOGRAM  01/28/2022   Follow-up echo: EF 50 to 55%.  No RWMA.  GRII DD.  Severe LA dilation.  Mild RA dilation with normal PAP and RAP.  Mild to moderate TR (no significant change noted)   Zio patch  08/2022   3-day: Currently NSR (HR range 47-91 with average 64 bpm) 1 F AVB with IVCD/BBB.  Rare isolated PACs (<1%).  Frequent PVCs (6.1% with rare couplets and triplets.  Also bigeminy.  2 atrial runs 8 beats 5 2 seconds.  No sustained arrhythmias.   ZIO Patch 14 d Event Monitor  10/2018   Results as below < 1% PVC.  (Notably improved after starting amiodarone)     Home Medications:  Prior to Admission medications   Medication Sig Start Date End Date Taking? Authorizing Provider  acetaminophen (TYLENOL) 500 MG tablet Take 500 mg by mouth every 6 (six) hours as needed for mild pain.    [provider]  amiodarone (PACERONE) 200 MG tablet TAKE ONE-HALF TABLET BY MOUTH EVERY TUES/THURS/SAT Patient taking differently: Take 100 mg by mouth See admin instructions. TAKE ONE-HALF TABLET BY MOUTH EVERY TUES/THURS/SAT 11/10/22   Tonia Ghent, MD  aspirin 81 MG tablet Take 1 tablet (81 mg total) by mouth daily. 09/14/17   Tonia Ghent, MD  carvedilol (COREG) 25 MG tablet TAKE 1 TABLET BY MOUTH TWICE DAILY Patient taking differently: Take 25 mg by mouth 2 (two) times daily with a meal. 11/10/22   Tonia Ghent, MD  Cholecalciferol (VITAMIN D) 1000 UNITS capsule Take  1,000 Units by mouth daily.    [provider]  doxycycline (VIBRA-TABS) 100 MG tablet Take 1 tablet (100 mg total) by mouth 2 (two) times daily. 11/23/22   Copland, Frederico Hamman, MD  Fluticasone-Umeclidin-Vilant (TRELEGY ELLIPTA) 100-62.5-25 MCG/ACT AEPB Inhale 1 puff into the lungs daily. Patient not taking: Reported on 12/02/2022 10/10/22   Deneise Lever, MD  furosemide (LASIX) 20 MG tablet  Take 1 tablet (20 mg total) by mouth daily. 06/17/22   Alver Sorrow, NP  Ipratropium-Albuterol (COMBIVENT RESPIMAT) 20-100 MCG/ACT AERS respimat Inhale 1 puff into the lungs every 6 (six) hours as needed for wheezing or shortness of breath. 01/04/23   Eustaquio Boyden, MD  JARDIANCE 10 MG TABS tablet TAKE 1/2 TABLET(5 MG) BY MOUTH DAILY Patient taking differently: Take 12.5 mg by mouth daily. 08/12/22   Marykay Lex, MD  metFORMIN (GLUCOPHAGE) 500 MG tablet TAKE 1/2 TABLET BY MOUTH DAILY WITH A MEAL Patient taking differently: Take 250 mg by mouth daily. 09/27/22   [name], MD  rosuvastatin (CRESTOR) 10 MG tablet TAKE 1 TABLET(10 MG) BY MOUTH DAILY Patient taking differently: Take 10 mg by mouth daily. 10/18/22   [name], MD  sacubitril-valsartan (ENTRESTO) 97-103 MG Take 1 tablet by mouth 2 (two) times daily. 10/24/22   Marykay Lex, MD  traMADol (ULTRAM) 50 MG tablet TAKE 1 TABLET(50 MG) BY MOUTH THREE TIMES DAILY AS NEEDED Patient taking differently: Take 50 mg by mouth 3 (three) times daily between meals as needed for moderate pain. 11/16/22   [name], MD    Inpatient Medications: Scheduled Meds:  sodium chloride   Intravenous Once   acetaminophen  650 mg Oral Q6H   aspirin  81 mg Oral Daily   Chlorhexidine Gluconate Cloth  6 each Topical Daily   [START ON 01/20/2023] clopidogrel  75 mg Oral Daily   empagliflozin  10 mg Oral Daily   fentaNYL (SUBLIMAZE) injection  50 mcg Intravenous Once   insulin aspart  0-15 Units Subcutaneous TID WC   lidocaine  1 patch  Transdermal Q24H   midodrine  10 mg Oral TID WC   rosuvastatin  10 mg Oral QHS   sodium chloride flush  10-40 mL Intracatheter Q12H   sodium chloride flush  3 mL Intravenous Q12H   torsemide  40 mg Oral Daily   Continuous Infusions:  sodium chloride     sodium chloride     amiodarone 30 mg/hr (01/19/23 1021)   lidocaine 1 mg/min (01/19/23 1021)   norepinephrine (LEVOPHED) Adult infusion 4 mcg/min (01/19/23 0911)   PRN Meds: sodium chloride, acetaminophen, ipratropium-albuterol, nitroGLYCERIN, ondansetron (ZOFRAN) IV, mouth rinse, oxyCODONE, sodium chloride flush, sodium chloride flush, traMADol  Allergies:    Allergies  Allergen Reactions   Ezetimibe-Simvastatin Other (See Comments)    REACTION: Muscle aches (side effect)   Lipitor [Atorvastatin] Other (See Comments)    Leg weakness    Claritin [Loratadine]     Possible cause of nightmares.     Spironolactone     Elevated potassium/creatinine   Tamiflu [Oseltamivir Phosphate] Other (See Comments)    nightmares   Pravastatin Sodium Other (See Comments)    REACTION: Muscle aches (side effect)   Sulfonamide Derivatives Nausea And Vomiting    Social History:   Social History   Socioeconomic History   Marital status: Married    Spouse name: Not on file   Number of children: 2   Years of education: Not on file   Highest education level: Not on file  Occupational History    Employer: RETIRED  Tobacco Use   Smoking status: Former    Years: 28.00    Types: Cigarettes    Quit date: 12/12/1993    Years since quitting: 29.1   Smokeless tobacco: Never  Vaping Use   Vaping Use: Never used  Substance and Sexual Activity   Alcohol use: No  Alcohol/week: 0.0 standard drinks of alcohol   Drug use: No   Sexual activity: Never  Other Topics Concern   Not on file  Social History Narrative   Two kids, two grandchildren, two great grandchildren.    Married 1969   Retired.    Social Determinants of Health   Financial  Resource Strain: Low Risk  (05/12/2022)   Overall Financial Resource Strain (CARDIA)    Difficulty of Paying Living Expenses: Not hard at all  Food Insecurity: No Food Insecurity (01/12/2023)   Hunger Vital Sign    Worried About Running Out of Food in the Last Year: Never true    Ran Out of Food in the Last Year: Never true  Transportation Needs: No Transportation Needs (01/12/2023)   PRAPARE - Administrator, Civil Service (Medical): No    Lack of Transportation (Non-Medical): No  Physical Activity: Sufficiently Active (05/12/2022)   Exercise Vital Sign    Days of Exercise per Week: 6 days    Minutes of Exercise per Session: 60 min  Stress: No Stress Concern Present (05/12/2022)   [employer] of Occupational Health - Occupational Stress Questionnaire    Feeling of Stress : Not at all  Social Connections: Not on file  Intimate Partner Violence: Not At Risk (01/12/2023)   Humiliation, Afraid, Rape, and Kick questionnaire    Fear of Current or Ex-Partner: No    Emotionally Abused: No    Physically Abused: No    Sexually Abused: No    Family History:   Family History  Problem Relation Age of Onset   Heart disease Mother        s/p pacemaker   Diabetes Mother    Heart disease Father    Diabetes Father    Heart disease Brother        MI   Cancer Brother        Lung   Hypertension Other    Colon cancer Neg Hx    Breast cancer Neg Hx      ROS:  Please see the history of present illness.  All other ROS reviewed and negative.     Physical Exam/Data:   Vitals:   01/19/23 0515 01/19/23 0530 01/19/23 0545 01/19/23 0600  BP: (!) 123/45 (!) 121/46 (!) 125/43 (!) 128/47  Pulse: (!) 47 (!) 43 (!) 49 (!) 52  Resp: 20 (!) 25 (!) 26 (!) 33  Temp:      TempSrc:      SpO2: 100% 98% 100% 98%  Weight:      Height:        Intake/Output Summary (Last 24 hours) at 01/19/2023 1124 Last data filed at 01/19/2023 1100 Gross per 24 hour  Intake 1278.55 ml  Output 1905 ml  Net  -626.45 ml      01/19/2023    5:00 AM 01/18/2023    4:27 AM 01/17/2023    3:45 AM  Last 3 Weights  Weight (lbs) 166 lb 7.2 oz 167 lb 5.3 oz 164 lb 10.9 oz  Weight (kg) 75.5 kg 75.9 kg 74.7 kg     Body mass index is 30.44 kg/m.  General:  Well nourished, well developed, in no acute distress HEENT: normal Neck: no JVD Vascular: No carotid bruits; Distal pulses 2+ bilaterally Cardiac:  irregular-irregular, no murmurs, gallops or rubs Lungs:  CAT b/l, no wheezing, rhonchi or rales  Abd: soft, nontender Ext: trace edema Musculoskeletal:  No deformities Skin: warm and dry  Neuro:  no  gross focal abnormalities noted Psych:  Normal affect   EKG:  The EKG was personally reviewed and demonstrates:    01/17/23: is a bigemenal rhythm, RBBB 82bpm, difficult to see P waves though likely SR/PACs   Telemetry:  Telemetry was personally reviewed and demonstrates:   Underlying is difficult, rhythm is irregular without clear P waves, likely AFib rates 50's with intermittently frequent PVCs and NSVTs some are polymorphic  Relevant CV Studies: As above  Laboratory Data:  High Sensitivity Troponin:   Recent Labs  Lab 01/10/23 1046 01/10/23 1325 01/11/23 0850  TROPONINIHS 32* 2,363* >24,000*     Chemistry Recent Labs  Lab 01/15/23 0457 01/15/23 4540 01/16/23 0418 01/17/23 0335 01/18/23 0359 01/19/23 0220 01/19/23 0228  NA 133*   < > 133* 131* 131* 130* 129*  K 3.8   < > 4.0 3.7 3.7 4.4 4.4  CL 97*  --  99 98 94* 94*  --   CO2 27  --  23 24 26 24   --   GLUCOSE 310*  --  229* 208* 220* 223*  --   BUN 20  --  16 20 23  30*  --   CREATININE 1.43*  --  1.34* 1.44* 1.58* 1.71*  --   CALCIUM 7.7*  --  8.0* 8.0* 7.9* 8.3*  --   MG 2.3  --  2.0  --   --  2.1  --   GFRNONAA 37*  --  40* 36* 32* 30*  --   ANIONGAP 9  --  11 9 11 12   --    < > = values in this interval not displayed.    Recent Labs  Lab 01/19/23 0220  PROT 5.0*  ALBUMIN 2.2*  AST 86*  ALT 31  ALKPHOS 52  BILITOT 1.7*    Lipids No results for input(s): "CHOL", "TRIG", "HDL", "LABVLDL", "LDLCALC", "CHOLHDL" in the last 168 hours.  Hematology Recent Labs  Lab 01/18/23 0359 01/18/23 1525 01/19/23 0220 01/19/23 0228  WBC 11.0* 10.7* 10.9*  --   RBC 2.58* 2.91* 2.84*  --   HGB 7.5* 8.7* 8.4* 8.5*  HCT 24.0* 26.5* 26.2* 25.0*  MCV 93.0 91.1 92.3  --   MCH 29.1 29.9 29.6  --   MCHC 31.3 32.8 32.1  --   RDW 14.7 14.9 14.9  --   PLT 312 396 438*  --    Thyroid No results for input(s): "TSH", "FREET4" in the last 168 hours.  BNPNo results for input(s): "BNP", "PROBNP" in the last 168 hours.  DDimer No results for input(s): "DDIMER" in the last 168 hours.   Radiology/Studies:  No results found.   Assessment and Plan:   PVCs, NSVTs, R on T Torsades last night Lidocaine added post arrest (at 1) Follow levels Continue amiodarone gtt (at 30) Planned for temp pacing wire to overdrive pace. Milrinone was stopped this AM Nor epi remains (at 4) Keep K+ and Mag replenished  No a/c given recent R fem artery hematoma > PSA > rupture Wound vac is in place Remains on ASA and Plavix given STEMI with PCI  this admission  Not currently an ICD candidate, with wound/wound Vac Pressor requirements EP will follow      Risk Assessment/Risk Scores:     For questions or updates, please contact [place] Please consult [email or web address] for contact info under    Signed, Baldwin Jamaica, PA-C  01/19/2023 11:24 AM

## 2023-01-19 NOTE — Progress Notes (Signed)
Inpatient Rehabilitation Admissions Coordinator   Noted events of this am. I will hold on Rehab consult at this time until medically stabilized.  Danne Baxter, RN, MSN Rehab Admissions Coordinator [phone] 01/19/2023 8:20 AM

## 2023-01-19 NOTE — H&P (View-Only) (Signed)
Advanced Heart Failure Rounding Note  PCP-Cardiologist: Dr. Ellyn Hack AHF: Dr. 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 Now admitted for acute lateral wall MI 2/2 distal LCx occlusion c/b severe ischemic MR and acute systolic heart failure w/ low output, CI 2.1 on RHC. Required placement of IABP, but later removed given concerns for developing limb ischemia=>found to have Right femoral PSA   Subjective:   1/31 Underwent thrombin injection to right femoral PSA . Hgb drifting down 10.3>8.3>6.8  2/1 Started on amio drip to suppress PVCs. Got dose of IV lasix x1. CVP low.  2/4 Ruptured PSA. Returned to OR for repair. VAC in place 2/5 Midodrine 5 mg three times daily added.  Back in A fib.  2/7 TEE with mod MR.  2/8 VT arrest--> shock x1 ROSC after 2 minutes. Given lidocaine and amio was increased to 60 mg.   Norepi 3 mcg + Milrinone 0.125 mcg + amio 60. CO-OX 66  Lactic acid 3  Complaining of fatigue.   Objective:   Weight Range: 75.5 kg Body mass index is 30.44 kg/m.   Vital Signs:   Temp:  [97.3 F (36.3 C)-99.2 F (37.3 C)] 97.8 F (36.6 C) (02/07 2300) Pulse Rate:  [37-130] 52 (02/08 0600) Resp:  [12-43] 33 (02/08 0600) BP: (90-155)/(28-104) 128/47 (02/08 0600) SpO2:  [89 %-100 %] 98 % (02/08 0600) Weight:  [75.5 kg] 75.5 kg (02/08 0500) Last BM Date : 01/18/23  Weight change: Filed Weights   01/17/23 0345 01/18/23 0427 01/19/23 0500  Weight: 74.7 kg 75.9 kg 75.5 kg    Intake/Output:   Intake/Output Summary (Last 24 hours) at 01/19/2023 0739 Last data filed at 01/19/2023 0600 Gross per 24 hour  Intake 1278.55 ml  Output 1725 ml  Net -446.45 ml     Physical Exam  General: No resp difficulty HEENT: normal Neck: supple. JVP 7-8 . Carotids 2+ bilat; no bruits. No lymphadenopathy or thryomegaly appreciated. Cor: PMI nondisplaced. Regular rate & rhythm. No rubs, gallops  or murmurs. Lungs: clear Abdomen: soft, nontender, nondistended. No hepatosplenomegaly. No bruits or masses. Good bowel sounds. Extremities: no cyanosis, clubbing, rash, edema.  R groin prevena VAC.  Neuro: alert & orientedx3, cranial nerves grossly intact. moves all 4 extremities w/o difficulty. Affect pleasant  Telemetry  Junctional Rhythm with frequent PVCs/NSVT 40-50s  Labs    CBC Recent Labs    01/18/23 1525 01/19/23 0220 01/19/23 0228  WBC 10.7* 10.9*  --   HGB 8.7* 8.4* 8.5*  HCT 26.5* 26.2* 25.0*  MCV 91.1 92.3  --   PLT 396 438*  --    Basic Metabolic Panel Recent Labs    01/18/23 0359 01/19/23 0220 01/19/23 0228  NA 131* 130* 129*  K 3.7 4.4 4.4  CL 94* 94*  --   CO2 26 24  --   GLUCOSE 220* 223*  --   BUN 23 30*  --   CREATININE 1.58* 1.71*  --   CALCIUM 7.9* 8.3*  --   MG  --  2.1  --    Liver Function Tests Recent Labs    01/19/23 0220  AST 86*  ALT 31  ALKPHOS 52  BILITOT 1.7*  PROT 5.0*  ALBUMIN 2.2*    No results for input(s): "LIPASE", "AMYLASE" in the last 72 hours. Cardiac Enzymes No results for input(s): "CKTOTAL", "CKMB", "CKMBINDEX", "TROPONINI" in the last 72  hours.  BNP: BNP (last 3 results) Recent Labs    06/17/22 1000 08/31/22 1613 01/10/23 1048  BNP 1,397.5* 1,178.5* >4,500.0*    ProBNP (last 3 results) Recent Labs    06/09/22 1506  PROBNP 4,638*     D-Dimer No results for input(s): "DDIMER" in the last 72 hours. Hemoglobin A1C No results for input(s): "HGBA1C" in the last 72 hours. Fasting Lipid Panel No results for input(s): "CHOL", "HDL", "LDLCALC", "TRIG", "CHOLHDL", "LDLDIRECT" in the last 72 hours.  Thyroid Function Tests No results for input(s): "TSH", "T4TOTAL", "T3FREE", "THYROIDAB" in the last 72 hours.  Invalid input(s): "FREET3"  Other results:   Imaging    No results found.   Medications:     Scheduled Medications:  sodium chloride   Intravenous Once   acetaminophen  650 mg Oral  Q6H   aspirin  81 mg Oral Daily   Chlorhexidine Gluconate Cloth  6 each Topical Daily   empagliflozin  10 mg Oral Daily   fentaNYL (SUBLIMAZE) injection  50 mcg Intravenous Once   insulin aspart  0-15 Units Subcutaneous TID WC   lidocaine  1 patch Transdermal Q24H   midodrine  10 mg Oral TID WC   rosuvastatin  10 mg Oral QHS   sodium chloride flush  10-40 mL Intracatheter Q12H   sodium chloride flush  3 mL Intravenous Q12H   ticagrelor  90 mg Oral BID   torsemide  40 mg Oral Daily    Infusions:  sodium chloride     sodium chloride     amiodarone 60 mg/hr (01/19/23 0600)   milrinone 0.125 mcg/kg/min (01/19/23 0600)   norepinephrine (LEVOPHED) Adult infusion 3 mcg/min (01/19/23 0600)    PRN Medications: sodium chloride, acetaminophen, ipratropium-albuterol, nitroGLYCERIN, ondansetron (ZOFRAN) IV, mouth rinse, oxyCODONE, phenylephrine, sodium chloride flush, sodium chloride flush, traMADol    Assessment/Plan   1. Cardiogenic shock/acute on chronic systolic CHF: Echo this admission with EF 20-25%, mild LV dilation, mild RV dysfunction, severe MR with restricted posterior leaflet.  Echo in 2/23 with EF 50-55%.  Patient has history of nonischemic cardiomyopathy (?PVC-mediated) that improved.  She reports 2 months of dyspnea then had lateral STEMI 1/30 with chest pain.  Per Dr McLean--> He did not  think that her PLOM occlusion can explain the extent of her cardiomyopathy, so suspect EF may have been falling prior to this event (symptomatic at least 2 months).  Suspect mixed ischemic/nonischemic cardiomyopathy.  Infarct-related MR also plays a role (see below).  She initially had IABP for cardiogenic shock, now removed due to right leg ischemia.  - Lactic acid  4.1>1>3  VT arrest on 01/18/23.  - Remains on milrinone 0.125 + NE 3  mcg. CO-OX 66% . Stop milrinone.  - CVP 8-9. Continue torsemide 40 mg daily.  - Continue midodrine to 10 mg tid.  -No GDMT while on pressors - Renal function  stable.  2. Mitral regurgitation: severe MR with restricted posterior leaflet, suspect infarct-related MR with PLOM occlusion.  -TEE --Moderate MR. Follow.  3. CAD: Lateral STEMI with occluded pLOM, treated with DES.   - No chest pain.  - Continue ASA 81 - Switch from Brillinta to plavix.  - Continue statin.  4. 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 Stop milrinone.  - continue amio drip and adding lidocaine drip  5. Right femoral PSA: s/p thrombin injection by Dr. Carlis Abbott.  - 2/4 stat u/s with recurrent PSA with active bleeding into PSA - Vascular Team  appreciated. S/P PSA repair with VAC placement.  - Discussed with Vascular ok to ambulate.  6. AKI on CKD stage 3b - Baseline 1.3-1.5 Peak 1.84 in setting of ATN  - Scr 1.44-->1.58 -->1.7 today  - Resume Jardiance at some point  7. Acute blood loss anemia: Hgb down to 6.8 -> 2u -> 9.2 -> 8.6 -> 9.5->8.4 ->8.3 ->7.5  - Check CBC at 1200 8. Acute Hypoxic Respiratory Failure - resolving. On room air 9. PAF -  In an out of A fib.  -Adding heparin drip.   10. VT Arrest 2/7 with DC-CV x1 + lidocaine. ROSC after 2 minutes - Continues to have frequent ectopy. - Consult EP ICD consideration.  -Continue heparin drip.    Amy Clegg NP-C  01/19/2023 7:39 AM  Agree with above.  Had R-on-T phenomenon last night in setting of sinus brady. Let to torsades -> VF.  Arrested. Got CPR and shock. Continues with frequent multifocal PVCs.   CVP 8-9. Lactate 3.0  TEE EF 30-35% mild to moderate MR  General:  Sitting up in bed. No resp difficulty HEENT: normal Neck: supple. JVP 8-9. Carotids 2+ bilat; no bruits. No lymphadenopathy or thryomegaly appreciated. Cor: PMI nondisplaced. Regular rate & rhythm. 2/6 MR Lungs: clear Abdomen: soft, nontender, nondistended. No hepatosplenomegaly. No bruits or masses. Good bowel sounds. Extremities: no cyanosis, clubbing, rash, edema  RFA praveena ok  Neuro: alert & orientedx3,  cranial nerves grossly intact. moves all 4 extremities w/o difficulty. Affect pleasant  Have discussed with EP. Will place TVP for overdrive pacing. Continue IV amio for now. Stop milrinone.   D/w VVS ok for [facility] so will start low-dose heparin.  CRITICAL CARE Performed by: Glori Bickers  Total critical care time: 55 minutes  Critical care time was exclusive of separately billable procedures and treating other patients.  Critical care was necessary to treat or prevent imminent or life-threatening deterioration.  Critical care was time spent personally by me (independent of midlevel providers or residents) on the following activities: development of treatment plan with patient and/or surrogate as well as nursing, discussions with consultants, evaluation of patient's response to treatment, examination of patient, obtaining history from patient or surrogate, ordering and performing treatments and interventions, ordering and review of laboratory studies, ordering and review of radiographic studies, pulse oximetry and re-evaluation of patient's condition.  Glori Bickers, MD  12:42 PM

## 2023-01-19 NOTE — Progress Notes (Signed)
Called by nurse that patient had Vfib episode ROSC was achieved in 2 minutes. 1 shock was delivered. No drugs given  After ROSC_ she is back to baseline. Conversing- BP stable and breathing well She is having PVCs and most likely Vfib was due to progressive HF/PVCs Will increase amiodarone to 60 (she is baseline HR 50s) and give lidocaine 50 Check K and Mg to ensure >4 and >2 respectively Will check CVP and Co-Oxy as well to see if milrinone can be weaned off to improve PVC

## 2023-01-19 NOTE — Progress Notes (Signed)
   01/19/23 0215  Spiritual Encounters  Type of Visit Initial  Care provided to: Pt and family  Conversation partners present during encounter Nurse  Referral source Code page  Reason for visit Code  OnCall Visit Yes  Spiritual Framework  Presenting Themes Impactful experiences and emotions;Courage hope and growth;Significant life change  Community/Connection Faith community;Family  Patient Stress Factors Health changes  Interventions  Spiritual Care Interventions Made Established relationship of care and support;Compassionate presence;Reflective listening;Normalization of emotions;Prayer  Intervention Outcomes  Outcomes Awareness of support;Reduced anxiety  Spiritual Care Plan  Spiritual Care Issues Still Outstanding Referring to oncoming chaplain for further support   Chaplain Mirna Mires responded to Code [name] page, patient was alert and willing to visit, her husband was at her bedside. She reached for my hand to hold as she told me about her health condition and how it has impacted her life recently. Pt has support of family and church family. Chaplain provided active listening, a calm presence and prayer.

## 2023-01-19 NOTE — Progress Notes (Addendum)
0214- Patient's cardiac monitor displayed and alarmed sustained ventricular tachycardia. Staff immediately arrived to patient bedside, palpated no pulse, and initiated compressions. Code Blue called and E-Link notified. Patient's eyes open but was unresponsive. Back board and Zoll pads applied to patient during compressions. Bag Valve Mask immediately applied to patient by Respiratory Therapy.   0216- Pulse check, no palpable pulse. Shock delivered. Compressions continued.   0217- Return of Spontaneous Circulation, pulses palpable, patient immediately alert and oriented x4. No ACLS medications required. Transitioned to 5 Liters nasal cannula.   Dr. Humphrey Rolls, Cardiology, arrived at patient bedside at 0229. Verbal order to increase amiodarone gtt from 30mg /hr to 60mg /hr and 1-time dose of 50mg  IV lidocaine. Per Cardiology, no current needs or indications for transcutaneous pacing. Zoll pads remaining on patient, connected to [place]. Per Cardiology, plan to continue to monitor heart rate and hemodynamics. Blood pressure remains stable. Blood work sent to lab for Complete Blood Count, Lactic Acid, Complete Metabolic Panel, and Magnesium.   Patient's husband remained in room during code event. This RN, other staff Rns, and Charge RN explained sequence of events to patient/husband and discussed nature & consequences of Ventricular Tachycardia, including its nature and its effect on her heart, circulation, and hemodynamics. Patient's husband then stated that Ms. Bolds' code event was due to her not wearing her CPAP machine to sleep. This RN explained to patient and her husband that her cardiac monitor also includes her respirations per minute, and that she did not appear to have stopped breathing prior to code. This RN asked patient if she has been wearing her CPAP at night to sleep while in the hospital. Patient's husband immediately said "Yes, she has," but Ms. Rittenhouse stated "No, I have not. It doesn't work right  and I haven't been using it here." This RN again attempted to explain to patient's husband that Ms. Rubert did not stop breathing prior to code, and again discussed the nature of Ventricular Tachycardia. All questions from patient and husband answered by this RN.   Following code event, patient noted to have smears of bowel movement on her gown and sheets. This RN and Education officer, community, RN cleaned patient and changed patient's gown and full linens, and covered patient in warm blankets per patient request. At this time, patient has no active questions or pain aside from compression-related pain. Currently resting comfortably in bed with husband at bedside.

## 2023-01-19 NOTE — Progress Notes (Signed)
ANTICOAGULATION CONSULT NOTE - Initial Consult  Pharmacy Consult for heparin Indication: atrial fibrillation  Allergies  Allergen Reactions   Ezetimibe-Simvastatin Other (See Comments)    REACTION: Muscle aches (side effect)   Lipitor [Atorvastatin] Other (See Comments)    Leg weakness    Claritin [Loratadine]     Possible cause of nightmares.     Spironolactone     Elevated potassium/creatinine   Tamiflu [Oseltamivir Phosphate] Other (See Comments)    nightmares   Pravastatin Sodium Other (See Comments)    REACTION: Muscle aches (side effect)   Sulfonamide Derivatives Nausea And Vomiting    Patient Measurements: Height: 5\' 2"  (157.5 cm) Weight: 75.5 kg (166 lb 7.2 oz) IBW/kg (Calculated) : 50.1   Vital Signs: BP: 125/72 (02/08 1500) Pulse Rate: 80 (02/08 1500)  Labs: Recent Labs    01/17/23 0335 01/18/23 0359 01/18/23 1525 01/19/23 0220 01/19/23 0228  HGB 8.3* 7.5* 8.7* 8.4* 8.5*  HCT 26.1* 24.0* 26.5* 26.2* 25.0*  PLT 284 312 396 438*  --   CREATININE 1.44* 1.58*  --  1.71*  --     Estimated Creatinine Clearance: 24.1 mL/min (A) (by C-G formula based on SCr of 1.71 mg/dL (H)).   Medical History: Past Medical History:  Diagnosis Date   Calcific tendonitis 2008   Treatment of left leg   Cardiomyopathy ([place]) 06/2017   a) Echo: EF 25-30%. GR 1-2 DD w/ elevated LVEDP. Mild valvular Dz; b) Cardiac MRI 2/'19: frequent PVCs (diffiuclt to interpret) - EF ~27% w/ diffuse HK.  No evidence of infarct, infiltrative Dz or myocarditis. -- ? if related to PVCs. c) f/u Echo 6/'19: EF 35-40%. Gr 1 DD. Diffuse HK.-> d) Jan 2020 EF 50 to 55%.  Mod MAC, mod LAE.  Ao Sclerosis; e) Echo 1/'21 - EF 60-65%, Gr II DD. Mild Mod MR.    Chronic combined systolic and diastolic CHF, NYHA class 2 and ACA/AHA stage C    Now essentially resolved, back to simply diastolic CHF   Coronary artery disease, non-occlusive    mild-moderate CAD 06/30/17 cath   CTS (carpal tunnel syndrome)     Diabetes mellitus    Type II   Dysrhythmia    patient said that she cant remember what it is   Frequent unifocal PVCs 01/2018   Event Monitor: NSR, W/ frequent multifocal PVCs (9%-down from 30% prior to amiodarone) and PACs.  Nighttime bradycardia suggestive of OSA.';  Zio patch September 2023: PVC burden now 6.1%.   Hiatal hernia    with reflux   Hyperlipidemia    Statin intolerant   Hypertension    Ichthyosis congenita    Intermittent claudication of both lower extremities due to atherosclerosis ([place]) 08/22/2021   LEA Dopplers 09/09/2021:  Right: Total occlusion noted in the superficial femoral artery. Atherosclerosis noted throughout extremity, see note above. Three vessel runoff.  Left: Total occlusion noted in the superficial femoral artery and/or popliteal artery. Total occlusion noted in the distal anterior tibial artery. Athereosclerosis noted throughout extremity.    NSVD (normal spontaneous vaginal delivery) 1971 & 1972   Obesity    Plantar fasciitis    Left   Pulmonary nodule    Imaged multiple times and benign appearing   Wears glasses     Assessment: 81yof admitted s/p STEMI with DES to LCx on asa81 + ticagrelor > transition to clopidogrel 300mg  x1 then 75mg  qd with need for anticoagulation in setting of new Afib Post procedure groin hematoma - evacuated by VVS -  now stable  Patient on amiodarone drip and in/out Afib - will start low dose heparin drip given recent groin complications with plans to convert to apixaban when more stable and procedures completed  Patient with rhythm changes today lidocaine drip added > temp pacer placed > start heparin drip after   Goal of Therapy:  Heparin level < 0.3  Monitor platelets by anticoagulation protocol: Yes   Plan:  Heparin drip 500uts/hr - no up titration Daily CBC and heparin level Monitor s/s bleeding    Bonnita Nasuti Pharm.D. CPP, BCPS Clinical Pharmacist [phone] 01/19/2023 3:13 PM

## 2023-01-20 ENCOUNTER — Encounter (HOSPITAL_COMMUNITY): Payer: Self-pay | Admitting: Internal Medicine

## 2023-01-20 DIAGNOSIS — I5023 Acute on chronic systolic (congestive) heart failure: Secondary | ICD-10-CM | POA: Diagnosis not present

## 2023-01-20 DIAGNOSIS — I214 Non-ST elevation (NSTEMI) myocardial infarction: Secondary | ICD-10-CM | POA: Diagnosis not present

## 2023-01-20 DIAGNOSIS — R570 Cardiogenic shock: Secondary | ICD-10-CM | POA: Diagnosis not present

## 2023-01-20 LAB — GLUCOSE, CAPILLARY
Glucose-Capillary: 113 mg/dL — ABNORMAL HIGH (ref 70–99)
Glucose-Capillary: 152 mg/dL — ABNORMAL HIGH (ref 70–99)
Glucose-Capillary: 89 mg/dL (ref 70–99)

## 2023-01-20 LAB — COOXEMETRY PANEL
Carboxyhemoglobin: 2.2 % — ABNORMAL HIGH (ref 0.5–1.5)
Carboxyhemoglobin: 3.5 % — ABNORMAL HIGH (ref 0.5–1.5)
Methemoglobin: <0.7 % (ref 0.0–1.5)
Methemoglobin: 1.8 % — ABNORMAL HIGH (ref 0.0–1.5)
O2 Saturation: 51.7 %
O2 Saturation: 79 %
Total hemoglobin: 8.6 g/dL — ABNORMAL LOW (ref 12.0–16.0)
Total hemoglobin: 8.8 g/dL — ABNORMAL LOW (ref 12.0–16.0)

## 2023-01-20 LAB — SURGICAL PCR SCREEN
MRSA, PCR: NEGATIVE
Staphylococcus aureus: NEGATIVE

## 2023-01-20 LAB — BASIC METABOLIC PANEL
Anion gap: 8 (ref 5–15)
BUN: 29 mg/dL — ABNORMAL HIGH (ref 8–23)
CO2: 29 mmol/L (ref 22–32)
Calcium: 8.5 mg/dL — ABNORMAL LOW (ref 8.9–10.3)
Chloride: 96 mmol/L — ABNORMAL LOW (ref 98–111)
Creatinine, Ser: 1.56 mg/dL — ABNORMAL HIGH (ref 0.44–1.00)
GFR, Estimated: 33 mL/min — ABNORMAL LOW (ref >60)
Glucose, Bld: 100 mg/dL — ABNORMAL HIGH (ref 70–99)
Potassium: 3.8 mmol/L (ref 3.5–5.1)
Sodium: 133 mmol/L — ABNORMAL LOW (ref 135–145)

## 2023-01-20 LAB — CBC
HCT: 26.4 % — ABNORMAL LOW (ref 36.0–46.0)
Hemoglobin: 8.6 g/dL — ABNORMAL LOW (ref 12.0–15.0)
MCH: 29.9 pg (ref 26.0–34.0)
MCHC: 32.6 g/dL (ref 30.0–36.0)
MCV: 91.7 fL (ref 80.0–100.0)
Platelets: 494 10*3/uL — ABNORMAL HIGH (ref 150–400)
RBC: 2.88 MIL/uL — ABNORMAL LOW (ref 3.87–5.11)
RDW: 15.1 % (ref 11.5–15.5)
WBC: 8.2 10*3/uL (ref 4.0–10.5)
nRBC: 0 % (ref 0.0–0.2)

## 2023-01-20 LAB — HEPARIN LEVEL (UNFRACTIONATED): Heparin Unfractionated: 0.17 IU/mL — ABNORMAL LOW (ref 0.30–0.70)

## 2023-01-20 LAB — LIDOCAINE LEVEL: Lidocaine Lvl: 6.1 ug/mL (ref 1.5–5.0)

## 2023-01-20 MED ORDER — HEPARIN (PORCINE) 25000 UT/250ML-% IV SOLN
500.0000 [IU]/h | INTRAVENOUS | Status: AC
  Administered 2023-01-21: 500 [IU]/h via INTRAVENOUS
  Filled 2023-01-20: qty 250

## 2023-01-20 MED ORDER — ALUM & MAG HYDROXIDE-SIMETH 200-200-20 MG/5ML PO SUSP
30.0000 mL | Freq: Four times a day (QID) | ORAL | Status: DC | PRN
  Administered 2023-01-20: 30 mL via ORAL
  Filled 2023-01-20: qty 30

## 2023-01-20 MED ORDER — SODIUM CHLORIDE 0.9% FLUSH
3.0000 mL | Freq: Two times a day (BID) | INTRAVENOUS | Status: DC
  Administered 2023-01-20 – 2023-02-18 (×38): 3 mL via INTRAVENOUS

## 2023-01-20 MED FILL — Heparin Sod (Porcine)-NaCl IV Soln 1000 Unit/500ML-0.9%: INTRAVENOUS | Qty: 1000 | Status: AC

## 2023-01-20 NOTE — Progress Notes (Signed)
  Progress Note    01/20/2023 8:09 AM 1 Day Post-Op  Subjective:  feels achy all over    Vitals:   01/20/23 0500 01/20/23 0600  BP: 106/64 118/65  Pulse: 80 80  Resp: 12 (!) 30  Temp:    SpO2: 96% 96%    Physical Exam: Lungs:  nonlabored Incisions:  R groin intact with new prevena vac Extremities:  BLE well perfused   CBC    Component Value Date/Time   WBC 8.2 01/20/2023 0426   RBC 2.88 (L) 01/20/2023 0426   HGB 8.6 (L) 01/20/2023 0426   HGB 14.2 10/09/2020 0935   HCT 26.4 (L) 01/20/2023 0426   HCT 44.9 10/09/2020 0935   PLT 494 (H) 01/20/2023 0426   PLT 379 10/09/2020 0935   MCV 91.7 01/20/2023 0426   MCV 91 10/09/2020 0935   MCH 29.9 01/20/2023 0426   MCHC 32.6 01/20/2023 0426   RDW 15.1 01/20/2023 0426   RDW 13.4 10/09/2020 0935   LYMPHSABS 0.9 01/15/2023 0950   LYMPHSABS 1.1 10/09/2020 0935   MONOABS 1.4 (H) 01/15/2023 0950   EOSABS 0.6 (H) 01/15/2023 0950   EOSABS 0.2 10/09/2020 0935   BASOSABS 0.1 01/15/2023 0950   BASOSABS 0.1 10/09/2020 0935    BMET    Component Value Date/Time   NA 133 (L) 01/20/2023 0426   NA 145 (H) 07/07/2022 1007   K 3.8 01/20/2023 0426   CL 96 (L) 01/20/2023 0426   CO2 29 01/20/2023 0426   GLUCOSE 100 (H) 01/20/2023 0426   BUN 29 (H) 01/20/2023 0426   BUN 24 07/07/2022 1007   CREATININE 1.56 (H) 01/20/2023 0426   CREATININE 1.28 (H) 01/14/2022 1551   CALCIUM 8.5 (L) 01/20/2023 0426   GFRNONAA 33 (L) 01/20/2023 0426   GFRAA 49 (L) 10/09/2020 0933    INR    Component Value Date/Time   INR 1.4 (H) 01/10/2023 2145     Intake/Output Summary (Last 24 hours) at 01/20/2023 0809 Last data filed at 01/20/2023 0600 Gross per 24 hour  Intake 593.46 ml  Output 1815 ml  Net -1221.54 ml      Assessment/Plan:  82 y.o. female s/p R groin PSA repair  -R groin intact with prevena vac. Will continue as long as it has good suction -BLE warm and well perfused  Vicente Serene, PA-C [facility] [phone] 01/20/2023 8:09 AM

## 2023-01-20 NOTE — Progress Notes (Signed)
Rounding Note    Patient Name: Natasha Woodard Date of Encounter: 01/20/2023  [place] Cardiologist: None   Subjective   I hurt everywhere, reports her entire body aches.  Inpatient Medications    Scheduled Meds:  sodium chloride   Intravenous Once   acetaminophen  650 mg Oral Q6H   aspirin  81 mg Oral Daily   Chlorhexidine Gluconate Cloth  6 each Topical Daily   clopidogrel  75 mg Oral Daily   empagliflozin  10 mg Oral Daily   fentaNYL (SUBLIMAZE) injection  50 mcg Intravenous Once   insulin aspart  0-15 Units Subcutaneous TID WC   lidocaine  1 patch Transdermal Q24H   lidocaine  1 patch Transdermal Q24H   midodrine  10 mg Oral TID WC   rosuvastatin  10 mg Oral QHS   sodium chloride flush  10-40 mL Intracatheter Q12H   sodium chloride flush  3 mL Intravenous Q12H   torsemide  40 mg Oral Daily   Continuous Infusions:  sodium chloride     amiodarone 30 mg/hr (01/20/23 0500)   norepinephrine (LEVOPHED) Adult infusion 2 mcg/min (01/19/23 1212)   PRN Meds: sodium chloride, acetaminophen, ipratropium-albuterol, nitroGLYCERIN, ondansetron (ZOFRAN) IV, mouth rinse, oxyCODONE, sodium chloride flush, sodium chloride flush, traMADol   Vital Signs    Vitals:   01/20/23 0300 01/20/23 0400 01/20/23 0500 01/20/23 0600  BP: 109/60 105/61 106/64 118/65  Pulse: 80 80 80 80  Resp: (!) 30 (!) 25 12 (!) 30  Temp:      TempSrc:      SpO2: 95% 94% 96% 96%  Weight:   71.4 kg   Height:        Intake/Output Summary (Last 24 hours) at 01/20/2023 0803 Last data filed at 01/20/2023 0600 Gross per 24 hour  Intake 593.46 ml  Output 1815 ml  Net -1221.54 ml      01/20/2023    5:00 AM 01/19/2023    5:00 AM 01/18/2023    4:27 AM  Last 3 Weights  Weight (lbs) 157 lb 6.5 oz 166 lb 7.2 oz 167 lb 5.3 oz  Weight (kg) 71.4 kg 75.5 kg 75.9 kg      Telemetry    V paced 80 - Personally Reviewed  ECG    No new EKGs - Personally Reviewed  Physical Exam   GEN: No acute  distress.   Neck: No JVD, R IJ site is stable Cardiac: RRR, no murmurs, rubs, or gallops.  Respiratory:CTA b/l (ant auscultation only). GI: Soft, nontender, non-distended  MS: No edema; No deformity. Neuro:  Nonfocal  Psych: Normal affect   Labs    High Sensitivity Troponin:   Recent Labs  Lab 01/10/23 1046 01/10/23 1325 01/11/23 0850  TROPONINIHS 32* 2,363* >24,000*     Chemistry Recent Labs  Lab 01/15/23 0457 01/15/23 [id number] 01/16/23 0418 01/17/23 0335 01/18/23 0359 01/19/23 0220 01/19/23 0228 01/20/23 0426  NA 133*   < > 133*   < > 131* 130* 129* 133*  K 3.8   < > 4.0   < > 3.7 4.4 4.4 3.8  CL 97*  --  99   < > 94* 94*  --  96*  CO2 27  --  23   < > 26 24  --  29  GLUCOSE 310*  --  229*   < > 220* 223*  --  100*  BUN 20  --  16   < > 23 30*  --  29*  CREATININE 1.43*  --  1.34*   < > 1.58* 1.71*  --  1.56*  CALCIUM 7.7*  --  8.0*   < > 7.9* 8.3*  --  8.5*  MG 2.3  --  2.0  --   --  2.1  --   --   PROT  --   --   --   --   --  5.0*  --   --   ALBUMIN  --   --   --   --   --  2.2*  --   --   AST  --   --   --   --   --  86*  --   --   ALT  --   --   --   --   --  31  --   --   ALKPHOS  --   --   --   --   --  52  --   --   BILITOT  --   --   --   --   --  1.7*  --   --   GFRNONAA 37*  --  40*   < > 32* 30*  --  33*  ANIONGAP 9  --  11   < > 11 12  --  8   < > = values in this interval not displayed.    Lipids No results for input(s): "CHOL", "TRIG", "HDL", "LABVLDL", "LDLCALC", "CHOLHDL" in the last 168 hours.  Hematology Recent Labs  Lab 01/18/23 1525 01/19/23 0220 01/19/23 0228 01/20/23 0426  WBC 10.7* 10.9*  --  8.2  RBC 2.91* 2.84*  --  2.88*  HGB 8.7* 8.4* 8.5* 8.6*  HCT 26.5* 26.2* 25.0* 26.4*  MCV 91.1 92.3  --  91.7  MCH 29.9 29.6  --  29.9  MCHC 32.8 32.1  --  32.6  RDW 14.9 14.9  --  15.1  PLT 396 438*  --  494*   Thyroid No results for input(s): "TSH", "FREET4" in the last 168 hours.  BNPNo results for input(s): "BNP", "PROBNP" in the last  168 hours.  DDimer No results for input(s): "DDIMER" in the last 168 hours.   Radiology    CARDIAC CATHETERIZATION  Result Date: 01/19/2023 Successful TVP for overdrive pacing of PVCs and NSVT.    Cardiac Studies    01/18/23: TEE LEFT VENTRICLE: EF = 30-35%.  RIGHT VENTRICLE: Normal size and function.  LEFT ATRIUM: Moderately dilated LEFT ATRIAL APPENDAGE: No thrombus.  RIGHT ATRIUM: Ok  AORTIC VALVE:  Trileaflet. Trivial AI MITRAL VALVE:    posterior leaflet restricted. Moderate MR TRICUSPID VALVE: Normal. Mild TR PULMONIC VALVE: Grossly normal. Triv PI  INTERATRIAL SEPTUM: No PFO or ASD. PERICARDIUM: Small effusion DESCENDING AORTA: severe plaque   01/10/23: LHC/PCI/IABP Prox Cx to Mid Cx lesion is 40% stenosed.   Prox RCA lesion is 30% stenosed.   Prox RCA to Mid RCA lesion is 30% stenosed.   Mid RCA lesion is 40% stenosed.   Dist Cx lesion is 100% stenosed.   3rd Mrg lesion is 100% stenosed.   Prox LAD lesion is 50% stenosed.   1st Diag lesion is 50% stenosed.   Mid LAD lesion is 20% stenosed.   Dist LAD lesion is 20% stenosed.   4th Mrg-1 lesion is 60% stenosed.   4th Mrg-2 lesion is 60% stenosed.   A drug-eluting stent was successfully placed.   Post intervention, there  is a 0% residual stenosis.   Hemodynamic findings consistent with moderate pulmonary hypertension.   Acute coronary syndrome/inferolateral STEMI secondary to distal occlusion of the circumflex marginal vessel.   Concomitant CAD with 50% proximal LAD stenosis, 50% stenosis in the first diagonal branch, 20% mid stenoses.   Mild 30 to 40% stenoses in the RCA.   Left circumflex vessel with total occlusion and TIMI 0 flow to the distal marginal vessel supplying the inferolateral apex.  There also is total occlusion of a small distal branch and diffuse segmental 60% stenosis in a medial branch.   Successful PCI to the distal circumflex with ultimate insertion of a 2.25 x 22 mm Onyx frontier stent dilated  with stent taper from 2.27-2.2 distally.   Insertion of intra-aortic balloon pump the right femoral artery in this patient with severe LV dysfunction and acute mitral regurgitation probably contributed by papillary muscle dysfunction..   Swan-Ganz catheterization demonstrating severe pulmonary hypertension with PA pressure 74/35, mean 48.   RECOMMENDATION: Patient received DAPT with aspirin/Brilinta.  Will need close observation of hemodynamic status with balloon pump.  CCM consult.  Patient apparently is statin intolerant and and for future PCSK9 inhibition.    01/10/23: TTE 1. Akinesis of the apical to mid lateral wall and apex. All other  segments are hypokinetic. Left ventricular ejection fraction, by  estimation, is 20 to 25%. Left ventricular ejection fraction by 2D MOD  biplane is 24.0 %. The left ventricle has severely  decreased function. The left ventricle demonstrates regional wall motion  abnormalities (see scoring diagram/findings for description). The left  ventricular internal cavity size was mildly dilated. Left ventricular  diastolic function could not be  evaluated.   2. Right ventricular systolic function is mildly reduced. The right  ventricular size is mildly enlarged.   3. Left atrial size was mild to moderately dilated.   4. Severe eccentric posteriorly directed mitral regurgitation. Given  lateral WMA, suspect this is ischemic MR (IIIB) due to restricted PMVL.  The mitral valve is grossly normal. Severe mitral valve regurgitation. No  evidence of mitral stenosis.   5. The aortic valve is grossly normal. Aortic valve regurgitation is not  visualized.   6. The inferior vena cava is normal in size with <50% respiratory  variability, suggesting right atrial pressure of 8 mmHg.   Comparison(s): Prior images unable to be directly viewed, comparison made  by report only. Changes from prior study are noted. The left ventricular  function is significantly worse. Severe  MR now present.   Conclusion(s)/Recommendation(s): Findings consistent with ischemic  cardiomyopathy.    Patient Profile     82 y.o. female NICM (PVC mediated, chronically on amiodarone) that had recovered, DM, HTN, HLD, CKD (III), PVD, OSA   Admitted with STEMI > urgent cath/PCI, IABP > R groin heamotma > PSA > rupture > OR for repair  Shock  01/19/23 : early AM R on T Torsades (brady mediated) arrest Temp pacing wire placed  Assessment & Plan    Torsades arrest Brady-mediated with junctional rhythm S/p temp pacing rhythm has stabilized Will stop lidocaine gtt Continue amiodarone gtt Threshold on temp wire this AM is <2, kept at 41m/80bpm  Plan for dual chamber ICD on Monday with Dr. TLovena LeWill be unable to interrupt DAPT Plan to stop heparin gtt [place] Sunday night   CAD Shock ICM C/w attending cardiology team Off milrinone and nor epi   R groin PSA rupture > repaired Acute blood loss anemia, stable Wound vac  is in place No infection Vascular following   7. AFib New for her CHA2DS2Vasc is 7 Heaprin gtt started    For questions or updates, please contact [place] Please consult [email or web address] for contact info under        Signed, Baldwin [place], PA-C  01/20/2023, 8:03 AM

## 2023-01-20 NOTE — Progress Notes (Signed)
PT Cancellation Note  Patient Details Name: Natasha Woodard MRN: [id number] DOB: 1941/06/06   Cancelled Treatment:    Reason Eval/Treat Not Completed: Medical issues which prohibited therapy. Pt with VT arrest yesterday and for pacer on Monday. Will hold PT until after pacer placed.    Shary Decamp [facility] 01/20/2023, 11:55 AM [place] Office [phone]

## 2023-01-20 NOTE — Progress Notes (Signed)
[place] for heparin Indication: atrial fibrillation  Allergies  Allergen Reactions   Ezetimibe-Simvastatin Other (See Comments)    REACTION: Muscle aches (side effect)   Lipitor [Atorvastatin] Other (See Comments)    Leg weakness    Claritin [Loratadine]     Possible cause of nightmares.     Spironolactone     Elevated potassium/creatinine   Tamiflu [Oseltamivir Phosphate] Other (See Comments)    nightmares   Pravastatin Sodium Other (See Comments)    REACTION: Muscle aches (side effect)   Sulfonamide Derivatives Nausea And Vomiting    Patient Measurements: Height: 5' 2"$  (157.5 cm) Weight: 71.4 kg (157 lb 6.5 oz) IBW/kg (Calculated) : 50.1   Vital Signs: Temp: 97.4 F (36.3 C) (02/09 0813) Temp Source: Oral (02/09 0813) BP: 112/68 (02/09 0900) Pulse Rate: 79 (02/09 0900)  Labs: Recent Labs    01/18/23 0359 01/18/23 1525 01/19/23 0220 01/19/23 0228 01/20/23 0426  HGB 7.5* 8.7* 8.4* 8.5* 8.6*  HCT 24.0* 26.5* 26.2* 25.0* 26.4*  PLT 312 396 438*  --  494*  HEPARINUNFRC  --   --   --   --  0.17*  CREATININE 1.58*  --  1.71*  --  1.56*     Estimated Creatinine Clearance: 25.7 mL/min (A) (by C-G formula based on SCr of 1.56 mg/dL (H)).   Medical History: Past Medical History:  Diagnosis Date   Calcific tendonitis 2008   Treatment of left leg   Cardiomyopathy ([place]) 06/2017   a) Echo: EF 25-30%. GR 1-2 DD w/ elevated LVEDP. Mild valvular Dz; b) Cardiac MRI 2/'19: frequent PVCs (diffiuclt to interpret) - EF ~27% w/ diffuse HK.  No evidence of infarct, infiltrative Dz or myocarditis. -- ? if related to PVCs. c) f/u Echo 6/'19: EF 35-40%. Gr 1 DD. Diffuse HK.-> d) Jan 2020 EF 50 to 55%.  Mod MAC, mod LAE.  Ao Sclerosis; e) Echo 1/'21 - EF 60-65%, Gr II DD. Mild Mod MR.    Chronic combined systolic and diastolic CHF, NYHA class 2 and ACA/AHA stage C    Now essentially resolved, back to simply diastolic CHF   Coronary artery  disease, non-occlusive    mild-moderate CAD 06/30/17 cath   CTS (carpal tunnel syndrome)    Diabetes mellitus    Type II   Dysrhythmia    patient said that she cant remember what it is   Frequent unifocal PVCs 01/2018   Event Monitor: NSR, W/ frequent multifocal PVCs (9%-down from 30% prior to amiodarone) and PACs.  Nighttime bradycardia suggestive of OSA.';  Zio patch September 2023: PVC burden now 6.1%.   Hiatal hernia    with reflux   Hyperlipidemia    Statin intolerant   Hypertension    Ichthyosis congenita    Intermittent claudication of both lower extremities due to atherosclerosis ([place]) 08/22/2021   LEA Dopplers 09/09/2021:  Right: Total occlusion noted in the superficial femoral artery. Atherosclerosis noted throughout extremity, see note above. Three vessel runoff.  Left: Total occlusion noted in the superficial femoral artery and/or popliteal artery. Total occlusion noted in the distal anterior tibial artery. Athereosclerosis noted throughout extremity.    NSVD (normal spontaneous vaginal delivery) 1971 & 1972   Obesity    Plantar fasciitis    Left   Pulmonary nodule    Imaged multiple times and benign appearing   Wears glasses     Assessment: Natasha Woodard admitted s/p STEMI with DES to LCx. Post-cath pt had groin hematoma evacuated  by VVS. Pt with ongoing AF, started on low-dose heparin infusion 2/9.  Heparin level 0.17, CBC stable. Planning for ICD 2/12 - heparin to stop at midnight on Sunday evening 2/11.  Goal of Therapy:  Heparin level < 0.3  Monitor platelets by anticoagulation protocol: Yes   Plan:  Heparin drip 500uts/hr - no up titration Daily CBC and heparin level Monitor s/s bleeding    Arrie Senate, PharmD, BCPS, [facility] Clinical Pharmacist [phone] Please check AMION for all [facility] numbers 01/20/2023

## 2023-01-20 NOTE — Progress Notes (Signed)
OT Cancellation Note  Patient Details Name: Natasha Woodard MRN: [id number] DOB: [birth year]   Cancelled Treatment:    Reason Eval/Treat Not Completed: Medical issues which prohibited therapy. Noted VT arrest yesterday. Plan to hold OT until after pacer on Monday.    Tyrone Schimke, OT [facility] Office: [phone]   01/20/2023, 11:26 AM

## 2023-01-20 NOTE — Progress Notes (Addendum)
Advanced Heart Failure Rounding Note  PCP-Cardiologist: Dr. Ellyn Hack AHF: Dr. 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 Now admitted for acute lateral wall MI 2/2 distal LCx occlusion c/b severe ischemic MR and acute systolic heart failure w/ low output, CI 2.1 on RHC. Required placement of IABP, but later removed given concerns for developing limb ischemia=>found to have Right femoral PSA   Subjective:   1/31 Underwent thrombin injection to right femoral PSA . Hgb drifting down 10.3>8.3>6.8  2/1 Started on amio drip to suppress PVCs. Got dose of IV lasix x1. CVP low.  2/4 Ruptured PSA. Returned to OR for repair. VAC in place 2/5 Midodrine 5 mg three times daily added.  Back in A fib.  2/7 TEE with mod MR.  2/8 VT arrest--> shock x1 ROSC after 2 minutes. Given lidocaine and amio was increased to 60 mg.   - TVP placed yesterday with 100% capture on telemetry overnight.  - Weaned off pressors & milrinone over the past 12-24H.  - This AM, SBP 110-120s.  - Mixed venous 52 overnight, 79 this AM. Will repeat once this afternoon.  - CVP 6 w/ large V waves 2/2 RV pacing.   Objective:   Weight Range: 71.4 kg Body mass index is 28.79 kg/m.   Vital Signs:   Temp:  [97.4 F (36.3 C)-98.2 F (36.8 C)] 97.4 F (36.3 C) (02/09 0813) Pulse Rate:  [44-81] 80 (02/09 0600) Resp:  [10-33] 30 (02/09 0600) BP: (86-145)/(46-104) 118/65 (02/09 0600) SpO2:  [94 %-100 %] 96 % (02/09 0600) Weight:  [71.4 kg] 71.4 kg (02/09 0500) Last BM Date : 12/19/22  Weight change: Filed Weights   01/18/23 0427 01/19/23 0500 01/20/23 0500  Weight: 75.9 kg 75.5 kg 71.4 kg    Intake/Output:   Intake/Output Summary (Last 24 hours) at 01/20/2023 0834 Last data filed at 01/20/2023 0600 Gross per 24 hour  Intake 593.46 ml  Output 1815 ml  Net -1221.54 ml     CVP 6-7 Physical Exam  General: resting comfortably.   HEENT: normal Neck: supple. JVP7 . Carotids 2+ bilat; no bruits. No lymphadenopathy or thryomegaly appreciated. Cor: PMI nondisplaced. Regular rate & rhythm. No rubs, gallops or murmurs. Lungs: clear Abdomen: soft, nontender, nondistended. No hepatosplenomegaly. No bruits or masses. Good bowel sounds. Extremities: no cyanosis, clubbing, rash, edema.  R groin prevena VAC.  Neuro: alert & orientedx3, cranial nerves grossly intact. moves all 4 extremities w/o difficulty. Affect pleasant  Telemetry  RV paced rhythm at 80BPM with no PVCs or NSVT.  Labs    CBC Recent Labs    01/19/23 0220 01/19/23 0228 01/20/23 0426  WBC 10.9*  --  8.2  HGB 8.4* 8.5* 8.6*  HCT 26.2* 25.0* 26.4*  MCV 92.3  --  91.7  PLT 438*  --  494*    Basic Metabolic Panel Recent Labs    01/19/23 0220 01/19/23 0228 01/20/23 0426  NA 130* 129* 133*  K 4.4 4.4 3.8  CL 94*  --  96*  CO2 24  --  29  GLUCOSE 223*  --  100*  BUN 30*  --  29*  CREATININE 1.71*  --  1.56*  CALCIUM 8.3*  --  8.5*  MG 2.1  --   --     Liver Function Tests Recent Labs    01/19/23 0220  AST 86*  ALT 31  ALKPHOS 52  BILITOT 1.7*  PROT 5.0*  ALBUMIN 2.2*     No results for input(s): "LIPASE", "AMYLASE" in the last 72 hours. Cardiac Enzymes No results for input(s): "CKTOTAL", "CKMB", "CKMBINDEX", "TROPONINI" in the last 72 hours.  BNP: BNP (last 3 results) Recent Labs    06/17/22 1000 08/31/22 1613 01/10/23 1048  BNP 1,397.5* 1,178.5* >4,500.0*     ProBNP (last 3 results) Recent Labs    06/09/22 1506  PROBNP 4,638*      Imaging    CARDIAC CATHETERIZATION  Result Date: 01/19/2023 Successful TVP for overdrive pacing of PVCs and NSVT.     Medications:     Scheduled Medications:  sodium chloride   Intravenous Once   acetaminophen  650 mg Oral Q6H   aspirin  81 mg Oral Daily   Chlorhexidine Gluconate Cloth  6 each Topical Daily   clopidogrel  75 mg Oral Daily   empagliflozin  10 mg Oral Daily    fentaNYL (SUBLIMAZE) injection  50 mcg Intravenous Once   insulin aspart  0-15 Units Subcutaneous TID WC   lidocaine  1 patch Transdermal Q24H   lidocaine  1 patch Transdermal Q24H   midodrine  10 mg Oral TID WC   rosuvastatin  10 mg Oral QHS   sodium chloride flush  10-40 mL Intracatheter Q12H   sodium chloride flush  3 mL Intravenous Q12H   sodium chloride flush  3 mL Intravenous Q12H   torsemide  40 mg Oral Daily    Infusions:  sodium chloride     amiodarone 30 mg/hr (01/20/23 0500)   norepinephrine (LEVOPHED) Adult infusion 2 mcg/min (01/19/23 1212)    PRN Medications: sodium chloride, acetaminophen, ipratropium-albuterol, nitroGLYCERIN, ondansetron (ZOFRAN) IV, mouth rinse, oxyCODONE, sodium chloride flush, sodium chloride flush, traMADol    Assessment/Plan   1. Cardiogenic shock/acute on chronic systolic CHF: Echo this admission with EF 20-25%, mild LV dilation, mild RV dysfunction, severe MR with restricted posterior leaflet.  Echo in 2/23 with EF 50-55%.  Patient has history of nonischemic cardiomyopathy (?PVC-mediated) that improved.  She reports 2 months of dyspnea then had lateral STEMI 1/30 with chest pain.  Per Dr McLean--> He did not  think that her PLOM occlusion can explain the extent of her cardiomyopathy, so suspect EF may have been falling prior to this event (symptomatic at least 2 months).  Suspect mixed ischemic/nonischemic cardiomyopathy.  Infarct-related MR also plays a role (see below).  She initially had IABP for cardiogenic shock, now removed due to right leg ischemia.  - Lactic acid  4.1>1>3  VT arrest on 01/18/23.  - Off of all pressors/inotropes. Mixed venous 52 overnight, repeat 79. Will recheck later today. - CVP 6-7, continue torsemide. Can likely decrease tomorrow. - Continue midodrine to 10 mg tid. SBP 110-120s; can start to wean midodrine later today. -No GDMT while on pressors - Renal function simproving, sCr 1.56 today. - Plan for dcICD on Monday  morning; overdrive pacing at [id number] with no breakthrough NSVT or PVCs overnight.  2. Mitral regurgitation: severe MR with restricted posterior leaflet, suspect infarct-related MR with PLOM occlusion.  -TEE --Moderate MR. Follow.  3. CAD: Lateral STEMI with occluded pLOM, treated with DES.   - No chest pain.  - Continue ASA 81 - Switch from Brillinta to plavix.  - Continue statin.  4. 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 Stop milrinone.  - continue amio drip & lidocaine drip, EP following. May wean off lidocaine later  today. 5. Right femoral PSA: s/p thrombin injection by Dr. Carlis Abbott.  - 2/4 stat u/s with recurrent PSA with active bleeding into PSA - Vascular Team appreciated. S/P PSA repair with VAC placement.  - Discussed with Vascular ok to ambulate.  6. AKI on CKD stage 3b - Baseline 1.3-1.5 Peak 1.84 in setting of ATN  - Resume Jardiance at some point  7. Acute blood loss anemia: Hgb down to 6.8 -> 2u -> 9.2 -> 8.6 -> 9.5->8.4 ->8.3 ->7.5  - Check CBC at 1200 8. Acute Hypoxic Respiratory Failure - resolving. On room air 9. PAF -  In an out of A fib.  -Adding heparin drip.   10. VT Arrest 2/7 with DC-CV x1 + lidocaine. ROSC after 2 minutes - Continues to have frequent ectopy. - Consult EP ICD consideration.  -Continue heparin drip.    Nathalia Wismer 8:42 AM   CRITICAL CARE Performed by: Hebert Soho   Total critical care time: 35 minutes  Critical care time was exclusive of separately billable procedures and treating other patients.  Critical care was necessary to treat or prevent imminent or life-threatening deterioration.  Critical care was time spent personally by me on the following activities: development of treatment plan with patient and/or surrogate as well as nursing, discussions with consultants, evaluation of patient's response to treatment, examination of patient, obtaining history from patient or surrogate, ordering and  performing treatments and interventions, ordering and review of laboratory studies, ordering and review of radiographic studies, pulse oximetry and re-evaluation of patient's condition.

## 2023-01-21 DIAGNOSIS — I472 Ventricular tachycardia, unspecified: Secondary | ICD-10-CM | POA: Diagnosis not present

## 2023-01-21 DIAGNOSIS — I214 Non-ST elevation (NSTEMI) myocardial infarction: Secondary | ICD-10-CM | POA: Diagnosis not present

## 2023-01-21 LAB — HEPARIN LEVEL (UNFRACTIONATED): Heparin Unfractionated: 0.25 IU/mL — ABNORMAL LOW (ref 0.30–0.70)

## 2023-01-21 LAB — CBC
HCT: 26 % — ABNORMAL LOW (ref 36.0–46.0)
Hemoglobin: 8.4 g/dL — ABNORMAL LOW (ref 12.0–15.0)
MCH: 29.8 pg (ref 26.0–34.0)
MCHC: 32.3 g/dL (ref 30.0–36.0)
MCV: 92.2 fL (ref 80.0–100.0)
Platelets: 517 10*3/uL — ABNORMAL HIGH (ref 150–400)
RBC: 2.82 MIL/uL — ABNORMAL LOW (ref 3.87–5.11)
RDW: 15.2 % (ref 11.5–15.5)
WBC: 10.5 10*3/uL (ref 4.0–10.5)
nRBC: 0.3 % — ABNORMAL HIGH (ref 0.0–0.2)

## 2023-01-21 LAB — COOXEMETRY PANEL
Carboxyhemoglobin: 1.9 % — ABNORMAL HIGH (ref 0.5–1.5)
Carboxyhemoglobin: 2.1 % — ABNORMAL HIGH (ref 0.5–1.5)
Methemoglobin: <0.7 % (ref 0.0–1.5)
Methemoglobin: 0.7 % (ref 0.0–1.5)
O2 Saturation: 48.9 %
O2 Saturation: 48.3 %
Total hemoglobin: 10.9 g/dL — ABNORMAL LOW (ref 12.0–16.0)
Total hemoglobin: 11.2 g/dL — ABNORMAL LOW (ref 12.0–16.0)

## 2023-01-21 LAB — GLUCOSE, CAPILLARY
Glucose-Capillary: 97 mg/dL (ref 70–99)
Glucose-Capillary: 119 mg/dL — ABNORMAL HIGH (ref 70–99)
Glucose-Capillary: 143 mg/dL — ABNORMAL HIGH (ref 70–99)

## 2023-01-21 LAB — BASIC METABOLIC PANEL
Anion gap: 12 (ref 5–15)
BUN: 32 mg/dL — ABNORMAL HIGH (ref 8–23)
CO2: 26 mmol/L (ref 22–32)
Calcium: 8.4 mg/dL — ABNORMAL LOW (ref 8.9–10.3)
Chloride: 96 mmol/L — ABNORMAL LOW (ref 98–111)
Creatinine, Ser: 1.72 mg/dL — ABNORMAL HIGH (ref 0.44–1.00)
GFR, Estimated: 29 mL/min — ABNORMAL LOW (ref >60)
Glucose, Bld: 112 mg/dL — ABNORMAL HIGH (ref 70–99)
Potassium: 3.9 mmol/L (ref 3.5–5.1)
Sodium: 134 mmol/L — ABNORMAL LOW (ref 135–145)

## 2023-01-21 MED ORDER — POTASSIUM CHLORIDE CRYS ER 20 MEQ PO TBCR
20.0000 meq | EXTENDED_RELEASE_TABLET | Freq: Once | ORAL | Status: AC
  Administered 2023-01-21: 20 meq via ORAL
  Filled 2023-01-21: qty 1

## 2023-01-21 NOTE — Progress Notes (Signed)
[place] for heparin Indication: atrial fibrillation  Allergies  Allergen Reactions   Ezetimibe-Simvastatin Other (See Comments)    REACTION: Muscle aches (side effect)   Lipitor [Atorvastatin] Other (See Comments)    Leg weakness    Claritin [Loratadine]     Possible cause of nightmares.     Spironolactone     Elevated potassium/creatinine   Tamiflu [Oseltamivir Phosphate] Other (See Comments)    nightmares   Pravastatin Sodium Other (See Comments)    REACTION: Muscle aches (side effect)   Sulfonamide Derivatives Nausea And Vomiting    Patient Measurements: Height: 5' 2"$  (157.5 cm) Weight: 71.4 kg (157 lb 6.5 oz) IBW/kg (Calculated) : 50.1   Vital Signs: Temp: 97.6 F (36.4 C) (02/10 0741) Temp Source: Axillary (02/10 0741) BP: 113/63 (02/10 0700) Pulse Rate: 80 (02/10 0700)  Labs: Recent Labs    01/18/23 1525 01/19/23 0220 01/19/23 0228 01/20/23 0426 01/21/23 0550  HGB 8.7* 8.4* 8.5* 8.6*  --   HCT 26.5* 26.2* 25.0* 26.4*  --   PLT 396 438*  --  494*  --   HEPARINUNFRC  --   --   --  0.17* 0.25*  CREATININE  --  1.71*  --  1.56* 1.72*     Estimated Creatinine Clearance: 23.3 mL/min (A) (by C-G formula based on SCr of 1.72 mg/dL (H)).   Medical History: Past Medical History:  Diagnosis Date   Calcific tendonitis 2008   Treatment of left leg   Cardiomyopathy ([place]) 06/2017   a) Echo: EF 25-30%. GR 1-2 DD w/ elevated LVEDP. Mild valvular Dz; b) Cardiac MRI 2/'19: frequent PVCs (diffiuclt to interpret) - EF ~27% w/ diffuse HK.  No evidence of infarct, infiltrative Dz or myocarditis. -- ? if related to PVCs. c) f/u Echo 6/'19: EF 35-40%. Gr 1 DD. Diffuse HK.-> d) Jan 2020 EF 50 to 55%.  Mod MAC, mod LAE.  Ao Sclerosis; e) Echo 1/'21 - EF 60-65%, Gr II DD. Mild Mod MR.    Chronic combined systolic and diastolic CHF, NYHA class 2 and ACA/AHA stage C    Now essentially resolved, back to simply diastolic CHF   Coronary  artery disease, non-occlusive    mild-moderate CAD 06/30/17 cath   CTS (carpal tunnel syndrome)    Diabetes mellitus    Type II   Dysrhythmia    patient said that she cant remember what it is   Frequent unifocal PVCs 01/2018   Event Monitor: NSR, W/ frequent multifocal PVCs (9%-down from 30% prior to amiodarone) and PACs.  Nighttime bradycardia suggestive of OSA.';  Zio patch September 2023: PVC burden now 6.1%.   Hiatal hernia    with reflux   Hyperlipidemia    Statin intolerant   Hypertension    Ichthyosis congenita    Intermittent claudication of both lower extremities due to atherosclerosis ([place]) 08/22/2021   LEA Dopplers 09/09/2021:  Right: Total occlusion noted in the superficial femoral artery. Atherosclerosis noted throughout extremity, see note above. Three vessel runoff.  Left: Total occlusion noted in the superficial femoral artery and/or popliteal artery. Total occlusion noted in the distal anterior tibial artery. Athereosclerosis noted throughout extremity.    NSVD (normal spontaneous vaginal delivery) 1971 & 1972   Obesity    Plantar fasciitis    Left   Pulmonary nodule    Imaged multiple times and benign appearing   Wears glasses     Assessment: 81yof admitted s/p STEMI with DES to LCx. Post-cath pt  had groin hematoma evacuated by VVS. Pt with ongoing AF, started on low-dose heparin infusion 2/9.  Heparin level 0.25. Planning for ICD 2/12 - heparin to stop at midnight on Sunday evening 2/11.  Goal of Therapy:  Heparin level < 0.3  Monitor platelets by anticoagulation protocol: Yes   Plan:  Heparin drip 500uts/hr - no up titration Daily CBC and heparin level Monitor s/s bleeding    Natasha Woodard, PharmD, BCPS, [facility] Clinical Pharmacist [phone] Please check AMION for all [facility] numbers 01/21/2023

## 2023-01-21 NOTE — Progress Notes (Signed)
Patient ID: Natasha Woodard, female   DOB: 21-Aug-1941, 82 y.o.   MRN: [id number]     Advanced Heart Failure Rounding Note  PCP-Cardiologist: Dr. Ellyn Hack AHF: Dr. 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 Now admitted for acute lateral wall MI 2/2 distal LCx occlusion c/b severe ischemic MR and acute systolic heart failure w/ low output, CI 2.1 on RHC. Required placement of IABP, but later removed given concerns for developing limb ischemia=>found to have Right femoral PSA   Subjective:   1/31 Underwent thrombin injection to right femoral PSA . Hgb drifting down 10.3>8.3>6.8  2/1 Started on amio drip to suppress PVCs. Got dose of IV lasix x1. CVP low.  2/4 Ruptured PSA. Returned to OR for repair. VAC in place 2/5 Midodrine 5 mg three times daily added.  Back in A fib.  2/7 TEE with mod MR.  2/8 VT arrest--> shock x1 ROSC after 2 minutes. Given lidocaine and amio was increased to 60 mg.  2/9 Transvenous pacemaker placed for overdrive pacing with PMVT.   Co-ox 49% this morning but was 79% yesterday.  She is off inotropes.  On midodrine 10 tid.  Creatinine mildly higher at 1.56 => 1.72.  Gentle diuresis with torsemide yesterday, CVP 10-11.    She is v-paced at 80 bpm. She remains on amiodarone gtt and heparin gtt.   Chest is sore from CPR.  Denies dyspnea.   Objective:   Weight Range: 71.4 kg Body mass index is 28.79 kg/m.   Vital Signs:   Temp:  [97.6 F (36.4 C)-99.2 F (37.3 C)] 97.6 F (36.4 C) (02/10 0741) Pulse Rate:  [79-81] 80 (02/10 0700) Resp:  [11-34] 17 (02/10 0700) BP: (85-133)/(55-73) 113/63 (02/10 0700) SpO2:  [88 %-99 %] 97 % (02/10 0700) Last BM Date : 01/20/23  Weight change: Filed Weights   01/18/23 0427 01/19/23 0500 01/20/23 0500  Weight: 75.9 kg 75.5 kg 71.4 kg    Intake/Output:   Intake/Output Summary (Last 24 hours) at 01/21/2023 0828 Last data filed at  01/21/2023 0700 Gross per 24 hour  Intake 609.77 ml  Output 1532 ml  Net -922.23 ml    CVP 10-11 Physical Exam  General: NAD Neck: JVP 10-12, no thyromegaly or thyroid nodule.  Lungs: Clear to auscultation bilaterally with normal respiratory effort. CV: Nondisplaced PMI.  Heart regular S1/S2, no S3/S4, no murmur.  No peripheral edema.   Abdomen: Soft, nontender, no hepatosplenomegaly, no distention.  Skin: Intact without lesions or rashes.  Neurologic: Alert and oriented x 3.  Psych: Normal affect. Extremities: No clubbing or cyanosis. Right groin wound vac.  HEENT: Normal.    Telemetry   RV paced rhythm at 80BPM with no PVCs or NSVT. Personally reviewed.   Labs    CBC Recent Labs    01/19/23 0220 01/19/23 0228 01/20/23 0426  WBC 10.9*  --  8.2  HGB 8.4* 8.5* 8.6*  HCT 26.2* 25.0* 26.4*  MCV 92.3  --  91.7  PLT 438*  --  99991111*   Basic Metabolic Panel Recent Labs    01/19/23 0220 01/19/23 0228 01/20/23 0426 01/21/23 0550  NA 130*   < > 133* 134*  K 4.4   < > 3.8 3.9  CL 94*  --  96* 96*  CO2 24  --  29 26  GLUCOSE 223*  --  100* 112*  BUN 30*  --  29* 32*  CREATININE 1.71*  --  1.56* 1.72*  CALCIUM 8.3*  --  8.5* 8.4*  MG 2.1  --   --   --    < > = values in this interval not displayed.   Liver Function Tests Recent Labs    01/19/23 0220  AST 86*  ALT 31  ALKPHOS 52  BILITOT 1.7*  PROT 5.0*  ALBUMIN 2.2*    No results for input(s): "LIPASE", "AMYLASE" in the last 72 hours. Cardiac Enzymes No results for input(s): "CKTOTAL", "CKMB", "CKMBINDEX", "TROPONINI" in the last 72 hours.  BNP: BNP (last 3 results) Recent Labs    06/17/22 1000 08/31/22 1613 01/10/23 1048  BNP 1,397.5* 1,178.5* >4,500.0*    ProBNP (last 3 results) Recent Labs    06/09/22 1506  PROBNP 4,638*     Imaging    No results found.   Medications:     Scheduled Medications:  sodium chloride   Intravenous Once   acetaminophen  650 mg Oral Q6H   aspirin  81  mg Oral Daily   Chlorhexidine Gluconate Cloth  6 each Topical Daily   clopidogrel  75 mg Oral Daily   empagliflozin  10 mg Oral Daily   fentaNYL (SUBLIMAZE) injection  50 mcg Intravenous Once   insulin aspart  0-15 Units Subcutaneous TID WC   lidocaine  1 patch Transdermal Q24H   lidocaine  1 patch Transdermal Q24H   midodrine  10 mg Oral TID WC   rosuvastatin  10 mg Oral QHS   sodium chloride flush  10-40 mL Intracatheter Q12H   sodium chloride flush  3 mL Intravenous Q12H   sodium chloride flush  3 mL Intravenous Q12H   torsemide  40 mg Oral Daily    Infusions:  sodium chloride     amiodarone 30 mg/hr (01/21/23 0700)   heparin 500 Units/hr (01/21/23 0700)   norepinephrine (LEVOPHED) Adult infusion 2 mcg/min (01/19/23 1212)    PRN Medications: sodium chloride, acetaminophen, alum & mag hydroxide-simeth, ipratropium-albuterol, nitroGLYCERIN, ondansetron (ZOFRAN) IV, mouth rinse, oxyCODONE, sodium chloride flush, sodium chloride flush, traMADol    Assessment/Plan   1. Cardiogenic shock/acute on chronic systolic CHF: Echo this admission with EF 20-25%, mild LV dilation, mild RV dysfunction, severe MR with restricted posterior leaflet.  Echo in 2/23 with EF 50-55%.  Patient has history of nonischemic cardiomyopathy (?PVC-mediated) that improved.  She reports 2 months of dyspnea then had lateral STEMI 1/30 with chest pain. I do not think that PLOM occlusion can explain the extent of her cardiomyopathy, so suspect EF may have been falling prior to this event (symptomatic at least 2 months).  Suspect mixed ischemic/nonischemic cardiomyopathy.  She initially had IABP for cardiogenic shock, now removed due to right leg ischemia. Now off pressors/inotropes, on midodrine 10 tid.  Co-ox lower this morning at 49% but was 79% yesterday.  CVP 10-11.  Creatinine mildly higher at 1.72.  - Would continue torsemide 40 mg daily + Jardiance 10 mg daily today, may need to resume IV Lasix if CVP rises  further.  - Repeat co-ox today, will try to avoid restarting milrinone.  - Continue midodrine to 10 mg tid. SBP 110-120s; can start to wean midodrine later today. - DDD ICD planned for Monday.  2. Mitral regurgitation: severe MR with restricted posterior leaflet on initial echo, suspect infarct-related MR with PLOM occlusion. TEE was later done showing moderate MR.  3. CAD: Lateral STEMI with occluded pLOM, treated with DES.   - Continue  ASA 81 - Switch from Brilinta to plavix.  - Continue statin.  4. 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  - Amiodarone gtt.  5. Right femoral PSA: s/p thrombin injection by Dr. Carlis Abbott. 2/4 stat u/s with recurrent PSA with active bleeding into PSA. S/p PSA repair with VAC placement.  - Discussed with Vascular ok to ambulate.  - Vac management per VVS.  6. AKI on CKD stage 3b: Baseline 1.3-1.5, peak 1.84 in setting of ATN.  Creatinine has trended up mildly to 1.72.  - Can continue Jardiance for now.  7. Acute blood loss anemia: Needs CBC today.  8. Atrial fibrillation: Paroxysmal.  In NSR now that she is on amiodarone and off inotrope.  - Continue heparin gtt, groin site stable.  - Continue amiodarone.   9. VT Arrest: 2/7, DCCV x 1 with CPR, ROSC after 2 minutes.  Polymorphic VT, appeared bradycardia-mediated with intermittent junctional rhythm.  - Overdrive pacing with TTVP in place. Rate 80.  - Plan for DDD  ICD on Monday.  - Continue amiodarone.   Loralie Champagne 01/21/2023  CRITICAL CARE Performed by: Loralie Champagne  Total critical care time: 35 minutes  Critical care time was exclusive of separately billable procedures and treating other patients.  Critical care was necessary to treat or prevent imminent or life-threatening deterioration.  Critical care was time spent personally by me on the following activities: development of treatment plan with patient and/or surrogate as well as nursing, discussions with  consultants, evaluation of patient's response to treatment, examination of patient, obtaining history from patient or surrogate, ordering and performing treatments and interventions, ordering and review of laboratory studies, ordering and review of radiographic studies, pulse oximetry and re-evaluation of patient's condition.

## 2023-01-22 DIAGNOSIS — R570 Cardiogenic shock: Secondary | ICD-10-CM | POA: Diagnosis not present

## 2023-01-22 DIAGNOSIS — I214 Non-ST elevation (NSTEMI) myocardial infarction: Secondary | ICD-10-CM | POA: Diagnosis not present

## 2023-01-22 LAB — BASIC METABOLIC PANEL
Anion gap: 12 (ref 5–15)
Anion gap: 10 (ref 5–15)
BUN: 37 mg/dL — ABNORMAL HIGH (ref 8–23)
BUN: 41 mg/dL — ABNORMAL HIGH (ref 8–23)
CO2: 26 mmol/L (ref 22–32)
CO2: 26 mmol/L (ref 22–32)
Calcium: 8.6 mg/dL — ABNORMAL LOW (ref 8.9–10.3)
Calcium: 8.4 mg/dL — ABNORMAL LOW (ref 8.9–10.3)
Chloride: 97 mmol/L — ABNORMAL LOW (ref 98–111)
Chloride: 97 mmol/L — ABNORMAL LOW (ref 98–111)
Creatinine, Ser: 2.06 mg/dL — ABNORMAL HIGH (ref 0.44–1.00)
Creatinine, Ser: 2 mg/dL — ABNORMAL HIGH (ref 0.44–1.00)
GFR, Estimated: 24 mL/min — ABNORMAL LOW (ref >60)
GFR, Estimated: 24 mL/min — ABNORMAL LOW (ref >60)
Glucose, Bld: 111 mg/dL — ABNORMAL HIGH (ref 70–99)
Glucose, Bld: 153 mg/dL — ABNORMAL HIGH (ref 70–99)
Potassium: 4 mmol/L (ref 3.5–5.1)
Potassium: 4.6 mmol/L (ref 3.5–5.1)
Sodium: 135 mmol/L (ref 135–145)
Sodium: 133 mmol/L — ABNORMAL LOW (ref 135–145)

## 2023-01-22 LAB — CBC
HCT: 26 % — ABNORMAL LOW (ref 36.0–46.0)
Hemoglobin: 8.6 g/dL — ABNORMAL LOW (ref 12.0–15.0)
MCH: 30.6 pg (ref 26.0–34.0)
MCHC: 33.1 g/dL (ref 30.0–36.0)
MCV: 92.5 fL (ref 80.0–100.0)
Platelets: 539 10*3/uL — ABNORMAL HIGH (ref 150–400)
RBC: 2.81 MIL/uL — ABNORMAL LOW (ref 3.87–5.11)
RDW: 15.6 % — ABNORMAL HIGH (ref 11.5–15.5)
WBC: 10.7 10*3/uL — ABNORMAL HIGH (ref 4.0–10.5)
nRBC: 0.3 % — ABNORMAL HIGH (ref 0.0–0.2)

## 2023-01-22 LAB — COOXEMETRY PANEL
Carboxyhemoglobin: 4 % — ABNORMAL HIGH (ref 0.5–1.5)
Methemoglobin: <0.7 % (ref 0.0–1.5)
O2 Saturation: 53.5 %
Total hemoglobin: 8.5 g/dL — ABNORMAL LOW (ref 12.0–16.0)

## 2023-01-22 LAB — GLUCOSE, CAPILLARY
Glucose-Capillary: 103 mg/dL — ABNORMAL HIGH (ref 70–99)
Glucose-Capillary: 146 mg/dL — ABNORMAL HIGH (ref 70–99)
Glucose-Capillary: 148 mg/dL — ABNORMAL HIGH (ref 70–99)
Glucose-Capillary: 119 mg/dL — ABNORMAL HIGH (ref 70–99)
Glucose-Capillary: 147 mg/dL — ABNORMAL HIGH (ref 70–99)

## 2023-01-22 LAB — HEPARIN LEVEL (UNFRACTIONATED): Heparin Unfractionated: 0.15 IU/mL — ABNORMAL LOW (ref 0.30–0.70)

## 2023-01-22 MED ORDER — MILRINONE LACTATE IN DEXTROSE 20-5 MG/100ML-% IV SOLN
0.1250 ug/kg/min | INTRAVENOUS | Status: DC
  Administered 2023-01-22 – 2023-01-24 (×3): 0.125 ug/kg/min via INTRAVENOUS
  Filled 2023-01-22 (×3): qty 100

## 2023-01-22 MED ORDER — FUROSEMIDE 10 MG/ML IJ SOLN
60.0000 mg | Freq: Two times a day (BID) | INTRAMUSCULAR | Status: AC
  Administered 2023-01-22 (×2): 60 mg via INTRAVENOUS
  Filled 2023-01-22 (×2): qty 6

## 2023-01-22 MED ORDER — POTASSIUM CHLORIDE CRYS ER 20 MEQ PO TBCR
40.0000 meq | EXTENDED_RELEASE_TABLET | Freq: Once | ORAL | Status: AC
  Administered 2023-01-22: 40 meq via ORAL
  Filled 2023-01-22: qty 2

## 2023-01-22 NOTE — Progress Notes (Signed)
[place] for heparin Indication: atrial fibrillation  Allergies  Allergen Reactions   Ezetimibe-Simvastatin Other (See Comments)    REACTION: Muscle aches (side effect)   Lipitor [Atorvastatin] Other (See Comments)    Leg weakness    Claritin [Loratadine]     Possible cause of nightmares.     Spironolactone     Elevated potassium/creatinine   Tamiflu [Oseltamivir Phosphate] Other (See Comments)    nightmares   Pravastatin Sodium Other (See Comments)    REACTION: Muscle aches (side effect)   Sulfonamide Derivatives Nausea And Vomiting    Patient Measurements: Height: 5' 2"$  (157.5 cm) Weight: 74 kg (163 lb 2.3 oz) IBW/kg (Calculated) : 50.1   Vital Signs: Temp: 98.8 F (37.1 C) (02/11 0630) Temp Source: Oral (02/11 0630) BP: 117/71 (02/11 0700) Pulse Rate: 80 (02/11 0700)  Labs: Recent Labs    01/20/23 0426 01/21/23 0550 01/21/23 0854 01/22/23 0432  HGB 8.6*  --  8.4* 8.6*  HCT 26.4*  --  26.0* 26.0*  PLT 494*  --  517* 539*  HEPARINUNFRC 0.17* 0.25*  --  0.15*  CREATININE 1.56* 1.72*  --  2.06*     Estimated Creatinine Clearance: 19.8 mL/min (A) (by C-G formula based on SCr of 2.06 mg/dL (H)).   Medical History: Past Medical History:  Diagnosis Date   Calcific tendonitis 2008   Treatment of left leg   Cardiomyopathy ([place]) 06/2017   a) Echo: EF 25-30%. GR 1-2 DD w/ elevated LVEDP. Mild valvular Dz; b) Cardiac MRI 2/'19: frequent PVCs (diffiuclt to interpret) - EF ~27% w/ diffuse HK.  No evidence of infarct, infiltrative Dz or myocarditis. -- ? if related to PVCs. c) f/u Echo 6/'19: EF 35-40%. Gr 1 DD. Diffuse HK.-> d) Jan 2020 EF 50 to 55%.  Mod MAC, mod LAE.  Ao Sclerosis; e) Echo 1/'21 - EF 60-65%, Gr II DD. Mild Mod MR.    Chronic combined systolic and diastolic CHF, NYHA class 2 and ACA/AHA stage C    Now essentially resolved, back to simply diastolic CHF   Coronary artery disease, non-occlusive    mild-moderate  CAD 06/30/17 cath   CTS (carpal tunnel syndrome)    Diabetes mellitus    Type II   Dysrhythmia    patient said that she cant remember what it is   Frequent unifocal PVCs 01/2018   Event Monitor: NSR, W/ frequent multifocal PVCs (9%-down from 30% prior to amiodarone) and PACs.  Nighttime bradycardia suggestive of OSA.';  Zio patch September 2023: PVC burden now 6.1%.   Hiatal hernia    with reflux   Hyperlipidemia    Statin intolerant   Hypertension    Ichthyosis congenita    Intermittent claudication of both lower extremities due to atherosclerosis ([place]) 08/22/2021   LEA Dopplers 09/09/2021:  Right: Total occlusion noted in the superficial femoral artery. Atherosclerosis noted throughout extremity, see note above. Three vessel runoff.  Left: Total occlusion noted in the superficial femoral artery and/or popliteal artery. Total occlusion noted in the distal anterior tibial artery. Athereosclerosis noted throughout extremity.    NSVD (normal spontaneous vaginal delivery) 1971 & 1972   Obesity    Plantar fasciitis    Left   Pulmonary nodule    Imaged multiple times and benign appearing   Wears glasses     Assessment: 81yof admitted s/p STEMI with DES to LCx. Post-cath pt had groin hematoma evacuated by VVS. Pt with ongoing AF, started on low-dose heparin infusion  2/9.  Heparin level 0.15, CBC stable Planning for ICD 2/12 - heparin to stop at midnight tonight 2/11.  Goal of Therapy:  Heparin level < 0.3  Monitor platelets by anticoagulation protocol: Yes   Plan:  Heparin drip 500uts/hr - no up titration, stop at midnight Daily CBC and heparin level Monitor s/s bleeding    Arrie Senate, PharmD, BCPS, [facility] Clinical Pharmacist [phone] Please check AMION for all [facility] Pharmacy numbers 01/22/2023

## 2023-01-22 NOTE — Progress Notes (Signed)
Patient ID: Natasha Woodard, female   DOB: 03-05-1941, 82 y.o.   MRN: [id number]     Advanced Heart Failure Rounding Note  PCP-Cardiologist: Dr. Ellyn Hack AHF: Dr. 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 Now admitted for acute lateral wall MI 2/2 distal LCx occlusion c/b severe ischemic MR and acute systolic heart failure w/ low output, CI 2.1 on RHC. Required placement of IABP, but later removed given concerns for developing limb ischemia=>found to have Right femoral PSA   Subjective:   1/31 Underwent thrombin injection to right femoral PSA . Hgb drifting down 10.3>8.3>6.8  2/1 Started on amio drip to suppress PVCs. Got dose of IV lasix x1. CVP low.  2/4 Ruptured PSA. Returned to OR for repair. VAC in place 2/5 Midodrine 5 mg three times daily added.  Back in A fib.  2/7 TEE with mod MR.  2/8 VT arrest--> shock x1 ROSC after 2 minutes. Given lidocaine and amio was increased to 60 mg.  2/9 Transvenous pacemaker placed for overdrive pacing with PMVT.   Co-ox 49% => 54%. She is off inotropes.  On midodrine 10 tid.  Creatinine higher at 1.56 => 1.72 => 2.06.  CVP rising, up to 13 today on po torsemide.    She is v-paced at 80 bpm. She is in a junctional bradycardia it appears under the pacing.  She remains on amiodarone gtt and heparin gtt.   Chest is mildly sore from CPR.  Denies dyspnea.   Objective:   Weight Range: 74 kg Body mass index is 29.84 kg/m.   Vital Signs:   Temp:  [98.4 F (36.9 C)-99.4 F (37.4 C)] 98.8 F (37.1 C) (02/11 0630) Pulse Rate:  [79-80] 80 (02/11 0700) Resp:  [16-34] 26 (02/11 0700) BP: (100-131)/(60-79) 117/71 (02/11 0700) SpO2:  [93 %-98 %] 98 % (02/11 0700) Weight:  [74 kg] 74 kg (02/11 0500) Last BM Date : 01/21/23  Weight change: Filed Weights   01/20/23 0500 01/21/23 0600 01/22/23 0500  Weight: 71.4 kg 71.2 kg 74 kg     Intake/Output:   Intake/Output Summary (Last 24 hours) at 01/22/2023 [id number] Last data filed at 01/22/2023 0745 Gross per 24 hour  Intake 510.67 ml  Output 710 ml  Net -199.33 ml    CVP 13 Physical Exam  General: NAD Neck: JVP 12-14, no thyromegaly or thyroid nodule.  Lungs: Clear to auscultation bilaterally with normal respiratory effort. CV: Nondisplaced PMI.  Heart regular S1/S2, no S3/S4, no murmur.  No peripheral edema.   Abdomen: Soft, nontender, no hepatosplenomegaly, no distention.  Skin: Intact without lesions or rashes.  Neurologic: Alert and oriented x 3.  Psych: Normal affect. Extremities: No clubbing or cyanosis.  HEENT: Normal.    Telemetry   RV paced rhythm at 80BPM with no PVCs or NSVT. Underlying junctional brady 40s.  Personally reviewed.   Labs    CBC Recent Labs    01/21/23 0854 01/22/23 0432  WBC 10.5 10.7*  HGB 8.4* 8.6*  HCT 26.0* 26.0*  MCV 92.2 92.5  PLT 517* [id number]*   Basic Metabolic Panel Recent Labs    01/21/23 0550 01/22/23 0432  NA 134* 135  K 3.9 4.0  CL 96* 97*  CO2 26 26  GLUCOSE 112* 111*  BUN 32* 37*  CREATININE 1.72* 2.06*  CALCIUM 8.4* 8.6*   Liver Function Tests No results for input(s): "AST", "ALT", "ALKPHOS", "  BILITOT", "PROT", "ALBUMIN" in the last 72 hours.   No results for input(s): "LIPASE", "AMYLASE" in the last 72 hours. Cardiac Enzymes No results for input(s): "CKTOTAL", "CKMB", "CKMBINDEX", "TROPONINI" in the last 72 hours.  BNP: BNP (last 3 results) Recent Labs    06/17/22 1000 08/31/22 1613 01/10/23 1048  BNP 1,397.5* 1,178.5* >4,500.0*    ProBNP (last 3 results) Recent Labs    06/09/22 1506  PROBNP 4,638*     Imaging    No results found.   Medications:     Scheduled Medications:  sodium chloride   Intravenous Once   acetaminophen  650 mg Oral Q6H   aspirin  81 mg Oral Daily   Chlorhexidine Gluconate Cloth  6 each Topical Daily   clopidogrel  75 mg Oral Daily   empagliflozin   10 mg Oral Daily   fentaNYL (SUBLIMAZE) injection  50 mcg Intravenous Once   insulin aspart  0-15 Units Subcutaneous TID WC   lidocaine  1 patch Transdermal Q24H   lidocaine  1 patch Transdermal Q24H   midodrine  10 mg Oral TID WC   rosuvastatin  10 mg Oral QHS   sodium chloride flush  10-40 mL Intracatheter Q12H   sodium chloride flush  3 mL Intravenous Q12H   sodium chloride flush  3 mL Intravenous Q12H   torsemide  40 mg Oral Daily    Infusions:  sodium chloride     amiodarone 30 mg/hr (01/22/23 0700)   heparin 500 Units/hr (01/22/23 0700)    PRN Medications: sodium chloride, acetaminophen, alum & mag hydroxide-simeth, ipratropium-albuterol, nitroGLYCERIN, ondansetron (ZOFRAN) IV, mouth rinse, oxyCODONE, sodium chloride flush, sodium chloride flush, traMADol    Assessment/Plan   1. Cardiogenic shock/acute on chronic systolic CHF: Echo this admission with EF 20-25%, mild LV dilation, mild RV dysfunction, severe MR with restricted posterior leaflet.  Echo in 2/23 with EF 50-55%.  Patient has history of nonischemic cardiomyopathy (?PVC-mediated) that improved.  She reports 2 months of dyspnea then had lateral STEMI 1/30 with chest pain. I do not think that PLOM occlusion can explain the extent of her cardiomyopathy, so suspect EF may have been falling prior to this event (symptomatic at least 2 months).  Suspect mixed ischemic/nonischemic cardiomyopathy.  She initially had IABP for cardiogenic shock, now removed due to right leg ischemia. Now off pressors/inotropes, on midodrine 10 tid.  Co-ox 49%=>54% with creatinine rising to 2.06.  CVP 13.  - I will restart milrinone 0.125 with rising creatinine and CVP and falling co-ox.   - Lasix 60 mg IV bid and stop torsemide.  Repeat BMET in pm.  - Can continue Jardiance for now.  - Continue midodrine to 10 mg tid.  - DDD ICD planned for Monday.  2. Mitral regurgitation: severe MR with restricted posterior leaflet on initial echo, suspect  infarct-related MR with PLOM occlusion. TEE was later done showing moderate MR.  3. CAD: Lateral STEMI with occluded pLOM, treated with DES.   - Continue ASA 81 - Switched from Brilinta to plavix.  - Continue statin.  4. 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  - Amiodarone gtt.  5. Right femoral PSA: s/p thrombin injection by Dr. Carlis Abbott. 2/4 stat u/s with recurrent PSA with active bleeding into PSA. S/p PSA repair with VAC placement.  - Discussed with Vascular ok to ambulate.  - Vac management per VVS.  6. AKI on CKD stage 3b: Baseline 1.3-1.5.  Creatinine has trended up to 2.06.  -  Restarting milrinone 0.125 as above with low co-ox and rising CVP.  7. Acute blood loss anemia:  Hgb stable 8.6.  8. Atrial fibrillation: Paroxysmal.  - Continue heparin gtt, groin site stable.  - Continue amiodarone.   9. VT Arrest: 2/7, DCCV x 1 with CPR, ROSC after 2 minutes.  Polymorphic VT, appeared bradycardia-mediated with intermittent junctional rhythm.  Of note, she was in junctional rhythm in 40s today when pacing held.  - Overdrive pacing with TTVP in place. Rate 80.  - Plan for DDD  ICD on Monday.  - Continue amiodarone.   Loralie Champagne 01/22/2023  CRITICAL CARE Performed by: Loralie Champagne  Total critical care time: 40 minutes  Critical care time was exclusive of separately billable procedures and treating other patients.  Critical care was necessary to treat or prevent imminent or life-threatening deterioration.  Critical care was time spent personally by me on the following activities: development of treatment plan with patient and/or surrogate as well as nursing, discussions with consultants, evaluation of patient's response to treatment, examination of patient, obtaining history from patient or surrogate, ordering and performing treatments and interventions, ordering and review of laboratory studies, ordering and review of radiographic studies, pulse oximetry  and re-evaluation of patient's condition.

## 2023-01-23 ENCOUNTER — Encounter (HOSPITAL_COMMUNITY)
Admission: EM | Disposition: A | Discharge status: Rehabilitation Facility | Payer: Self-pay | Source: Home / Self Care | Attending: Cardiology

## 2023-01-23 ENCOUNTER — Other Ambulatory Visit: Payer: Self-pay

## 2023-01-23 DIAGNOSIS — I4901 Ventricular fibrillation: Secondary | ICD-10-CM | POA: Diagnosis not present

## 2023-01-23 HISTORY — PX: ICD IMPLANT: EP1208

## 2023-01-23 LAB — BASIC METABOLIC PANEL
Anion gap: 12 (ref 5–15)
BUN: 40 mg/dL — ABNORMAL HIGH (ref 8–23)
CO2: 27 mmol/L (ref 22–32)
Calcium: 8.5 mg/dL — ABNORMAL LOW (ref 8.9–10.3)
Chloride: 97 mmol/L — ABNORMAL LOW (ref 98–111)
Creatinine, Ser: 1.97 mg/dL — ABNORMAL HIGH (ref 0.44–1.00)
GFR, Estimated: 25 mL/min — ABNORMAL LOW (ref >60)
Glucose, Bld: 200 mg/dL — ABNORMAL HIGH (ref 70–99)
Potassium: 4.1 mmol/L (ref 3.5–5.1)
Sodium: 136 mmol/L (ref 135–145)

## 2023-01-23 LAB — CBC
HCT: 26.3 % — ABNORMAL LOW (ref 36.0–46.0)
Hemoglobin: 8.1 g/dL — ABNORMAL LOW (ref 12.0–15.0)
MCH: 29.7 pg (ref 26.0–34.0)
MCHC: 30.8 g/dL (ref 30.0–36.0)
MCV: 96.3 fL (ref 80.0–100.0)
Platelets: 548 10*3/uL — ABNORMAL HIGH (ref 150–400)
RBC: 2.73 MIL/uL — ABNORMAL LOW (ref 3.87–5.11)
RDW: 16.4 % — ABNORMAL HIGH (ref 11.5–15.5)
WBC: 11.4 10*3/uL — ABNORMAL HIGH (ref 4.0–10.5)
nRBC: 0.2 % (ref 0.0–0.2)

## 2023-01-23 LAB — MAGNESIUM: Magnesium: 2.1 mg/dL (ref 1.7–2.4)

## 2023-01-23 LAB — COOXEMETRY PANEL
Carboxyhemoglobin: 2.7 % — ABNORMAL HIGH (ref 0.5–1.5)
Methemoglobin: <0.7 % (ref 0.0–1.5)
O2 Saturation: 70.3 %
Total hemoglobin: 8.6 g/dL — ABNORMAL LOW (ref 12.0–16.0)

## 2023-01-23 LAB — GLUCOSE, CAPILLARY
Glucose-Capillary: 138 mg/dL — ABNORMAL HIGH (ref 70–99)
Glucose-Capillary: 132 mg/dL — ABNORMAL HIGH (ref 70–99)
Glucose-Capillary: 132 mg/dL — ABNORMAL HIGH (ref 70–99)

## 2023-01-23 SURGERY — ICD IMPLANT

## 2023-01-23 MED ORDER — CHLORHEXIDINE GLUCONATE 4 % EX LIQD
60.0000 mL | Freq: Once | CUTANEOUS | Status: AC
  Administered 2023-01-23: 4 via TOPICAL

## 2023-01-23 MED ORDER — ONDANSETRON HCL 4 MG/2ML IJ SOLN: 4.0000 mg | Freq: Four times a day (QID) | INTRAMUSCULAR | Status: DC | PRN

## 2023-01-23 MED ORDER — SODIUM CHLORIDE 0.9 % IV SOLN
250.0000 mL | INTRAVENOUS | Status: DC
  Administered 2023-01-23: 250 mL via INTRAVENOUS

## 2023-01-23 MED ORDER — CEFAZOLIN SODIUM-DEXTROSE 1-4 GM/50ML-% IV SOLN
1.0000 g | Freq: Two times a day (BID) | INTRAVENOUS | Status: AC
  Administered 2023-01-24: 1 g via INTRAVENOUS
  Filled 2023-01-23: qty 50

## 2023-01-23 MED ORDER — MIDAZOLAM HCL 5 MG/5ML IJ SOLN
INTRAMUSCULAR | Status: AC
  Filled 2023-01-23: qty 5

## 2023-01-23 MED ORDER — FENTANYL CITRATE (PF) 100 MCG/2ML IJ SOLN
INTRAMUSCULAR | Status: AC
  Filled 2023-01-23: qty 2

## 2023-01-23 MED ORDER — FENTANYL CITRATE (PF) 100 MCG/2ML IJ SOLN
INTRAMUSCULAR | Status: DC | PRN
  Administered 2023-01-23 (×4): 12.5 ug via INTRAVENOUS

## 2023-01-23 MED ORDER — SODIUM CHLORIDE 0.9 % IV SOLN
INTRAVENOUS | Status: AC
  Filled 2023-01-23: qty 2

## 2023-01-23 MED ORDER — LIDOCAINE HCL (PF) 1 % IJ SOLN
INTRAMUSCULAR | Status: DC | PRN
  Administered 2023-01-23: 50 mL

## 2023-01-23 MED ORDER — SODIUM CHLORIDE 0.9% FLUSH: 3.0000 mL | INTRAVENOUS | Status: DC | PRN

## 2023-01-23 MED ORDER — CEFAZOLIN SODIUM-DEXTROSE 2-4 GM/100ML-% IV SOLN
INTRAVENOUS | Status: AC
  Filled 2023-01-23: qty 100

## 2023-01-23 MED ORDER — MIDAZOLAM HCL 5 MG/5ML IJ SOLN
INTRAMUSCULAR | Status: DC | PRN
  Administered 2023-01-23 (×4): 1 mg via INTRAVENOUS

## 2023-01-23 MED ORDER — HEPARIN (PORCINE) IN NACL 1000-0.9 UT/500ML-% IV SOLN
INTRAVENOUS | Status: AC
  Filled 2023-01-23: qty 500

## 2023-01-23 MED ORDER — SODIUM CHLORIDE 0.9 % IV SOLN: INTRAVENOUS | Status: DC

## 2023-01-23 MED ORDER — SODIUM CHLORIDE 0.9 % IV SOLN
80.0000 mg | INTRAVENOUS | Status: AC
  Administered 2023-01-23: 80 mg
  Filled 2023-01-23: qty 2

## 2023-01-23 MED ORDER — LIDOCAINE HCL 1 % IJ SOLN
INTRAMUSCULAR | Status: AC
  Filled 2023-01-23: qty 60

## 2023-01-23 MED ORDER — HEPARIN (PORCINE) IN NACL 1000-0.9 UT/500ML-% IV SOLN
INTRAVENOUS | Status: DC | PRN
  Administered 2023-01-23: 500 mL

## 2023-01-23 MED ORDER — CHLORHEXIDINE GLUCONATE 4 % EX LIQD: 60.0000 mL | Freq: Once | CUTANEOUS | Status: DC

## 2023-01-23 MED ORDER — ACETAMINOPHEN 325 MG PO TABS: 325.0000 mg | ORAL_TABLET | ORAL | Status: DC | PRN

## 2023-01-23 MED ORDER — CEFAZOLIN SODIUM-DEXTROSE 2-4 GM/100ML-% IV SOLN
2.0000 g | INTRAVENOUS | Status: AC
  Administered 2023-01-23: 2 g via INTRAVENOUS
  Filled 2023-01-23: qty 100

## 2023-01-23 SURGICAL SUPPLY — 8 items
CABLE SURGICAL S-101-97-12 (CABLE) ×1 IMPLANT
ICD MOMENTUM D121 (ICD Generator) IMPLANT
LEAD INGEVITY 7841 52 (Lead) IMPLANT
LEAD RELIANCE 0137-59 (Lead) IMPLANT
PAD DEFIB RADIO PHYSIO CONN (PAD) ×1 IMPLANT
SHEATH 7FR PRELUDE SNAP 13 (SHEATH) IMPLANT
SHEATH 9FR PRELUDE SNAP 13 (SHEATH) IMPLANT
TRAY PACEMAKER INSERTION (PACKS) ×1 IMPLANT

## 2023-01-23 NOTE — Progress Notes (Signed)
Inpatient Rehab Admissions Coordinator:   Following for my colleague, Danne Baxter.  Note plans for ICD tentatively this afternoon.  We will follow.   Shann Medal, PT, DPT Admissions Coordinator [phone] 01/23/23  12:00 PM

## 2023-01-23 NOTE — H&P (View-Only) (Signed)
Electrophysiology Rounding Note  Patient Name: Natasha Woodard Date of Encounter: 01/23/2023  Primary Cardiologist: None Electrophysiologist: New   Subjective   Doing ok this am. Has many questions about when she may be able to go home and when her device will occur.   Inpatient Medications    Scheduled Meds:  sodium chloride   Intravenous Once   acetaminophen  650 mg Oral Q6H   aspirin  81 mg Oral Daily   chlorhexidine  60 mL Topical Once   Chlorhexidine Gluconate Cloth  6 each Topical Daily   clopidogrel  75 mg Oral Daily   empagliflozin  10 mg Oral Daily   fentaNYL (SUBLIMAZE) injection  50 mcg Intravenous Once   gentamicin (GARAMYCIN) 80 mg in sodium chloride 0.9 % 500 mL irrigation  80 mg Irrigation On Call   insulin aspart  0-15 Units Subcutaneous TID WC   lidocaine  1 patch Transdermal Q24H   lidocaine  1 patch Transdermal Q24H   midodrine  10 mg Oral TID WC   rosuvastatin  10 mg Oral QHS   sodium chloride flush  10-40 mL Intracatheter Q12H   sodium chloride flush  3 mL Intravenous Q12H   sodium chloride flush  3 mL Intravenous Q12H   Continuous Infusions:  sodium chloride     sodium chloride     sodium chloride     amiodarone 30 mg/hr (01/23/23 0700)    ceFAZolin (ANCEF) IV     milrinone 0.125 mcg/kg/min (01/23/23 0700)   PRN Meds: sodium chloride, acetaminophen, alum & mag hydroxide-simeth, ipratropium-albuterol, nitroGLYCERIN, ondansetron (ZOFRAN) IV, mouth rinse, oxyCODONE, sodium chloride flush, sodium chloride flush, sodium chloride flush, traMADol   Vital Signs    Vitals:   01/23/23 0400 01/23/23 0500 01/23/23 0600 01/23/23 0700  BP: 110/61  108/60 115/60  Pulse: 79 80 80 80  Resp: (!) 27 19 (!) 21 (!) 22  Temp:      TempSrc:      SpO2: 97% 97% 97% 97%  Weight:  72.2 kg    Height:        Intake/Output Summary (Last 24 hours) at 01/23/2023 0717 Last data filed at 01/23/2023 0700 Gross per 24 hour  Intake 599.11 ml  Output 1730 ml  Net  -1130.89 ml   Filed Weights   01/21/23 0600 01/22/23 0500 01/23/23 0500  Weight: 71.2 kg 74 kg 72.2 kg    Physical Exam    GEN- The patient is well appearing, alert and oriented x 3 today.   HEENT- No gross abnormality.  Lungs- Clear to ausculation bilaterally, normal work of breathing Heart- Regular (V paced) rate and rhythm, no murmurs, rubs or gallops GI- soft, NT, ND, + BS Extremities- no clubbing or cyanosis. No edema Neuro- No obvious focal abnormality.   Telemetry    V pacing at 80 (personally reviewed)   Patient Profile     82 y.o. female NICM (PVC mediated, chronically on amiodarone) that had recovered, DM, HTN, HLD, CKD (III), PVD, OSA    Admitted with STEMI > urgent cath/PCI, IABP > R groin heamotma > PSA > rupture > OR for repair   Shock   01/19/23 : early AM R on T Torsades (brady mediated) arrest Temp pacing wire placed  Assessment & Plan    Torsades arrest Brady-mediated with junctional rhythm S/p temp pacing rhythm has stabilized Remains on amiodarone gtt Lidocaine gtt stopped.  Threshold on temp wire this AM is <2, kept at 50m/80bpm Tentatively on for  ICD this afternoon with Dr. Lovena Le.  Will be unable to interrupt DAPT Heparin held at midnight.     CAD Shock ICM Per HF team.  She was started back on milrinone yesterday.  Remains off Norepi.  Coox 70% this am.   Will defer to HF team, preferably would have PICC line out prior to ICD.    R groin PSA rupture > repaired Acute blood loss anemia, stable Wound vac in place No infectious symptoms.  Vascular following    7. AFib New for her this admission.  CHA2DS2Vasc is 7 Heaprin gtt held for possible device.   Tentatively on for ICD today. Dr. Lovena Le to see and we will discuss with HF team re: timing, if it is felt she needs several more days of optimization, may be best to also delay device.   For questions or updates, please contact [place] Please consult [email or web address] for contact  info under Cardiology/STEMI.  Signed, Shirley Friar, PA-C  01/23/2023, 7:17 AM   EP Attending  Patient seen and examined. Agree with above. The patient is improved. Her PICC line is out and she is comfortable in bed.She has her temp PM wire left in place. I have discussed the treatment options with the patient and recommend proceeding with ICD insertion. The indications/risks/benefits/goals/expectations were reviewed and she wishes to proceed.  Carleene Overlie Aly Hauser,MD

## 2023-01-23 NOTE — Progress Notes (Signed)
PHARMACY NOTE:  ANTIMICROBIAL RENAL DOSAGE ADJUSTMENT  Current antimicrobial regimen includes a mismatch between antimicrobial dosage and estimated renal function.  As per policy approved by the Pharmacy & Therapeutics and Medical Executive Committees, the antimicrobial dosage will be adjusted accordingly.  Current antimicrobial dosage:  Ancef 1gm IV Q8H x 2 doses  Indication: surgical prophylaxis  Renal Function:  Estimated Creatinine Clearance: 20.5 mL/min (A) (by C-G formula based on SCr of 1.97 mg/dL (H)). []$      On intermittent HD, scheduled: []$      On CRRT    Antimicrobial dosage has been changed to:  1gm IV Q12H x 1 dose  Additional comments:   Thank you for allowing pharmacy to be a part of this patient's care.   Christine Morton D. Mina Marble, PharmD, BCPS, [place] 01/23/2023, 7:46 PM

## 2023-01-23 NOTE — Progress Notes (Addendum)
  Progress Note    01/23/2023 10:50 AM 4 Days Post-Op  Subjective:  no complaints.  Incisional vac removed yesterday   Vitals:   01/23/23 0900 01/23/23 1000  BP: 116/60 116/67  Pulse: 80 80  Resp: (!) 23 (!) 24  Temp: 98.5 F (36.9 C)   SpO2: 95% 95%   Physical Exam: Lungs:  non labored Incisions:  R groin incision with maceration of surrounding skin medially Extremities:  feet warm Abdomen:  soft, NT, ND Neurologic: A&O  CBC    Component Value Date/Time   WBC 11.4 (H) 01/23/2023 0500   RBC 2.73 (L) 01/23/2023 0500   HGB 8.1 (L) 01/23/2023 0500   HGB 14.2 10/09/2020 0935   HCT 26.3 (L) 01/23/2023 0500   HCT 44.9 10/09/2020 0935   PLT 548 (H) 01/23/2023 0500   PLT 379 10/09/2020 0935   MCV 96.3 01/23/2023 0500   MCV 91 10/09/2020 0935   MCH 29.7 01/23/2023 0500   MCHC 30.8 01/23/2023 0500   RDW 16.4 (H) 01/23/2023 0500   RDW 13.4 10/09/2020 0935   LYMPHSABS 0.9 01/15/2023 0950   LYMPHSABS 1.1 10/09/2020 0935   MONOABS 1.4 (H) 01/15/2023 0950   EOSABS 0.6 (H) 01/15/2023 0950   EOSABS 0.2 10/09/2020 0935   BASOSABS 0.1 01/15/2023 0950   BASOSABS 0.1 10/09/2020 0935    BMET    Component Value Date/Time   NA 136 01/23/2023 0500   NA 145 (H) 07/07/2022 1007   K 4.1 01/23/2023 0500   CL 97 (L) 01/23/2023 0500   CO2 27 01/23/2023 0500   GLUCOSE 200 (H) 01/23/2023 0500   BUN 40 (H) 01/23/2023 0500   BUN 24 07/07/2022 1007   CREATININE 1.97 (H) 01/23/2023 0500   CREATININE 1.28 (H) 01/14/2022 1551   CALCIUM 8.5 (L) 01/23/2023 0500   GFRNONAA 25 (L) 01/23/2023 0500   GFRAA 49 (L) 10/09/2020 0933    INR    Component Value Date/Time   INR 1.4 (H) 01/10/2023 2145     Intake/Output Summary (Last 24 hours) at 01/23/2023 1050 Last data filed at 01/23/2023 1000 Gross per 24 hour  Intake 531.96 ml  Output 1720 ml  Net -1188.04 ml     Assessment/Plan:  82 y.o. female is s/p R groin PSA repair 4 Days Post-Op   BLE warm and well perfused R groin  incision with some maceration of surrounding skin medially; dry dressing changes with folded ABD pad, no tape to be changed BID Encouraged nutrition   Dagoberto Ligas, PA-C [facility] [phone] 01/23/2023 10:50 AM  VASCULAR STAFF ADDENDUM: I have independently interviewed and examined the patient. I agree with the above.  MUST keep right groin dry. High risk breakdown.  Dry ABD to crease changed BID.  Cassandria Santee, MD [facility] Phone Number: [phone] 01/23/2023 1:05 PM

## 2023-01-23 NOTE — Progress Notes (Addendum)
Electrophysiology Rounding Note  Patient Name: Natasha Woodard Date of Encounter: 01/23/2023  Primary Cardiologist: None Electrophysiologist: New   Subjective   Doing ok this am. Has many questions about when she may be able to go home and when her device will occur.   Inpatient Medications    Scheduled Meds:  sodium chloride   Intravenous Once   acetaminophen  650 mg Oral Q6H   aspirin  81 mg Oral Daily   chlorhexidine  60 mL Topical Once   Chlorhexidine Gluconate Cloth  6 each Topical Daily   clopidogrel  75 mg Oral Daily   empagliflozin  10 mg Oral Daily   fentaNYL (SUBLIMAZE) injection  50 mcg Intravenous Once   gentamicin (GARAMYCIN) 80 mg in sodium chloride 0.9 % 500 mL irrigation  80 mg Irrigation On Call   insulin aspart  0-15 Units Subcutaneous TID WC   lidocaine  1 patch Transdermal Q24H   lidocaine  1 patch Transdermal Q24H   midodrine  10 mg Oral TID WC   rosuvastatin  10 mg Oral QHS   sodium chloride flush  10-40 mL Intracatheter Q12H   sodium chloride flush  3 mL Intravenous Q12H   sodium chloride flush  3 mL Intravenous Q12H   Continuous Infusions:  sodium chloride     sodium chloride     sodium chloride     amiodarone 30 mg/hr (01/23/23 0700)    ceFAZolin (ANCEF) IV     milrinone 0.125 mcg/kg/min (01/23/23 0700)   PRN Meds: sodium chloride, acetaminophen, alum & mag hydroxide-simeth, ipratropium-albuterol, nitroGLYCERIN, ondansetron (ZOFRAN) IV, mouth rinse, oxyCODONE, sodium chloride flush, sodium chloride flush, sodium chloride flush, traMADol   Vital Signs    Vitals:   01/23/23 0400 01/23/23 0500 01/23/23 0600 01/23/23 0700  BP: 110/61  108/60 115/60  Pulse: 79 80 80 80  Resp: (!) 27 19 (!) 21 (!) 22  Temp:      TempSrc:      SpO2: 97% 97% 97% 97%  Weight:  72.2 kg    Height:        Intake/Output Summary (Last 24 hours) at 01/23/2023 0717 Last data filed at 01/23/2023 0700 Gross per 24 hour  Intake 599.11 ml  Output 1730 ml  Net  -1130.89 ml   Filed Weights   01/21/23 0600 01/22/23 0500 01/23/23 0500  Weight: 71.2 kg 74 kg 72.2 kg    Physical Exam    GEN- The patient is well appearing, alert and oriented x 3 today.   HEENT- No gross abnormality.  Lungs- Clear to ausculation bilaterally, normal work of breathing Heart- Regular (V paced) rate and rhythm, no murmurs, rubs or gallops GI- soft, NT, ND, + BS Extremities- no clubbing or cyanosis. No edema Neuro- No obvious focal abnormality.   Telemetry    V pacing at 80 (personally reviewed)   Patient Profile     82 y.o. female NICM (PVC mediated, chronically on amiodarone) that had recovered, DM, HTN, HLD, CKD (III), PVD, OSA    Admitted with STEMI > urgent cath/PCI, IABP > R groin heamotma > PSA > rupture > OR for repair   Shock   01/19/23 : early AM R on T Torsades (brady mediated) arrest Temp pacing wire placed  Assessment & Plan    Torsades arrest Brady-mediated with junctional rhythm S/p temp pacing rhythm has stabilized Remains on amiodarone gtt Lidocaine gtt stopped.  Threshold on temp wire this AM is <2, kept at 59m/80bpm Tentatively on for  ICD this afternoon with Dr. Lovena Le.  Will be unable to interrupt DAPT Heparin held at midnight.     CAD Shock ICM Per HF team.  She was started back on milrinone yesterday.  Remains off Norepi.  Coox 70% this am.   Will defer to HF team, preferably would have PICC line out prior to ICD.    R groin PSA rupture > repaired Acute blood loss anemia, stable Wound vac in place No infectious symptoms.  Vascular following    7. AFib New for her this admission.  CHA2DS2Vasc is 7 Heaprin gtt held for possible device.   Tentatively on for ICD today. Dr. Lovena Le to see and we will discuss with HF team re: timing, if it is felt she needs several more days of optimization, may be best to also delay device.   For questions or updates, please contact [place] Please consult [email or web address] for contact  info under Cardiology/STEMI.  Signed, Shirley Friar, PA-C  01/23/2023, 7:17 AM   EP Attending  Patient seen and examined. Agree with above. The patient is improved. Her PICC line is out and she is comfortable in bed.She has her temp PM wire left in place. I have discussed the treatment options with the patient and recommend proceeding with ICD insertion. The indications/risks/benefits/goals/expectations were reviewed and she wishes to proceed.  Carleene Overlie Natasha Rettig,MD

## 2023-01-23 NOTE — Progress Notes (Addendum)
Patient ID: Natasha Woodard, female   DOB: 1941-03-09, 82 y.o.   MRN: [id number]     Advanced Heart Failure Rounding Note  PCP-Cardiologist: Dr. Ellyn Hack AHF: Dr. 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 Now admitted for acute lateral wall MI 2/2 distal LCx occlusion c/b severe ischemic MR and acute systolic heart failure w/ low output, CI 2.1 on RHC. Required placement of IABP, but later removed given concerns for developing limb ischemia=>found to have Right femoral PSA   Subjective:   1/31 Underwent thrombin injection to right femoral PSA . Hgb drifting down 10.3>8.3>6.8  2/1 Started on amio drip to suppress PVCs. Got dose of IV lasix x1. CVP low.  2/4 Ruptured PSA. Returned to OR for repair. VAC in place 2/5 Midodrine 5 mg three times daily added.  Back in A fib.  2/7 TEE with mod MR.  2/8 VT arrest--> shock x1 ROSC after 2 minutes. Given lidocaine and amio was increased to 60 mg.  2/9 Transvenous pacemaker placed for overdrive pacing with PMVT.  2/11 Milrinone restarted, Co-ox 49%  Remains on milrinone 0.125. Co-ox 70%. CVP 10. SCr 2.1>>1.9   She is v-paced at 80 bpm. She is in a junctional bradycardia it appears under the pacing.  She remains on amiodarone gtt and heparin gtt.   No current complaints. Denies CP. No current dyspnea.   Objective:   Weight Range: 72.2 kg Body mass index is 29.11 kg/m.   Vital Signs:   Temp:  [97 F (36.1 C)-98.6 F (37 C)] 98.5 F (36.9 C) (02/12 0900) Pulse Rate:  [79-80] 80 (02/12 0900) Resp:  [13-31] 23 (02/12 0900) BP: (100-123)/(49-72) 116/60 (02/12 0900) SpO2:  [91 %-98 %] 95 % (02/12 0900) Weight:  [72.2 kg] 72.2 kg (02/12 0500) Last BM Date : 01/22/23  Weight change: Filed Weights   01/21/23 0600 01/22/23 0500 01/23/23 0500  Weight: 71.2 kg 74 kg 72.2 kg    Intake/Output:   Intake/Output Summary (Last 24 hours) at 01/23/2023  0947 Last data filed at 01/23/2023 0900 Gross per 24 hour  Intake 540.61 ml  Output 1720 ml  Net -1179.39 ml     Physical Exam  CVP 10  General:  Well appearing. No respiratory difficulty HEENT: normal Neck: supple. JVD 10 cm. +Rt IJ TVP, Carotids 2+ bilat; no bruits. No lymphadenopathy or thyromegaly appreciated. Cor: PMI nondisplaced. Regular rate & rhythm. No rubs, gallops or murmurs. Lungs: clear Abdomen: soft, nontender, nondistended. No hepatosplenomegaly. No bruits or masses. Good bowel sounds. Extremities: no cyanosis, clubbing, rash, edema Neuro: alert & oriented x 3, cranial nerves grossly intact. moves all 4 extremities w/o difficulty. Affect pleasant.  Telemetry   RV paced rhythm at 80BPM with no PVCs or NSVT. Underlying junctional brady 40s.  Personally reviewed.   Labs    CBC Recent Labs    01/22/23 0432 01/23/23 0500  WBC 10.7* 11.4*  HGB 8.6* 8.1*  HCT 26.0* 26.3*  MCV 92.5 96.3  PLT 539* [id number]   Basic Metabolic Panel Recent Labs    01/22/23 1600 01/23/23 0500  NA 133* 136  K 4.6 4.1  CL 97* 97*  CO2 26 27  GLUCOSE 153* 200*  BUN 41* 40*  CREATININE 2.00* 1.97*  CALCIUM 8.4* 8.5*  MG  --  2.1   Liver Function Tests No results for input(s): "AST", "ALT", "ALKPHOS", "BILITOT", "PROT", "ALBUMIN" in the  last 72 hours.   No results for input(s): "LIPASE", "AMYLASE" in the last 72 hours. Cardiac Enzymes No results for input(s): "CKTOTAL", "CKMB", "CKMBINDEX", "TROPONINI" in the last 72 hours.  BNP: BNP (last 3 results) Recent Labs    06/17/22 1000 08/31/22 1613 01/10/23 1048  BNP 1,397.5* 1,178.5* >4,500.0*    ProBNP (last 3 results) Recent Labs    06/09/22 1506  PROBNP 4,638*     Imaging    No results found.   Medications:     Scheduled Medications:  sodium chloride   Intravenous Once   acetaminophen  650 mg Oral Q6H   aspirin  81 mg Oral Daily   chlorhexidine  60 mL Topical Once   Chlorhexidine Gluconate Cloth  6  each Topical Daily   clopidogrel  75 mg Oral Daily   empagliflozin  10 mg Oral Daily   fentaNYL (SUBLIMAZE) injection  50 mcg Intravenous Once   gentamicin (GARAMYCIN) 80 mg in sodium chloride 0.9 % 500 mL irrigation  80 mg Irrigation On Call   insulin aspart  0-15 Units Subcutaneous TID WC   lidocaine  1 patch Transdermal Q24H   lidocaine  1 patch Transdermal Q24H   midodrine  10 mg Oral TID WC   rosuvastatin  10 mg Oral QHS   sodium chloride flush  10-40 mL Intracatheter Q12H   sodium chloride flush  3 mL Intravenous Q12H   sodium chloride flush  3 mL Intravenous Q12H    Infusions:  sodium chloride     sodium chloride     sodium chloride     amiodarone 30 mg/hr (01/23/23 0900)    ceFAZolin (ANCEF) IV     milrinone 0.125 mcg/kg/min (01/23/23 0900)    PRN Medications: sodium chloride, acetaminophen, alum & mag hydroxide-simeth, ipratropium-albuterol, nitroGLYCERIN, ondansetron (ZOFRAN) IV, mouth rinse, oxyCODONE, sodium chloride flush, sodium chloride flush, sodium chloride flush, traMADol    Assessment/Plan   1. Cardiogenic shock/acute on chronic systolic CHF: Echo this admission with EF 20-25%, mild LV dilation, mild RV dysfunction, severe MR with restricted posterior leaflet.  Echo in 2/23 with EF 50-55%.  Patient has history of nonischemic cardiomyopathy (?PVC-mediated) that improved.  She reports 2 months of dyspnea then had lateral STEMI 1/30 with chest pain. I do not think that PLOM occlusion can explain the extent of her cardiomyopathy, so suspect EF may have been falling prior to this event (symptomatic at least 2 months).  Suspect mixed ischemic/nonischemic cardiomyopathy.  She initially had IABP for cardiogenic shock, now removed due to right leg ischemia. Was weaned off pressors/inotropes but Milrinone restarted 2/11 for low Co-ox and rising SCr. Today on Milrinone 0.125. Co-ox improved, 49>>70% today. SCr 2.1>>1.9. CVP 10.  - Continue Milrinone 0.125. Will pull PICC prior  to DDD ICD . Will run through PIV.  - Hold Lasix today  - Can continue Jardiance for now.  - additional GDMT limited by hypotension and AKI  - Continue midodrine 10 mg tid.  - Plan for DDD ICD today  2. Mitral regurgitation: severe MR with restricted posterior leaflet on initial echo, suspect infarct-related MR with PLOM occlusion. TEE was later done showing moderate MR.  3. CAD: Lateral STEMI with occluded pLOM, treated with DES.   - Continue ASA 81 - Switched from Brilinta to plavix.  - Continue statin.  4. 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  - Amiodarone gtt.  5. Right femoral PSA: s/p thrombin injection by Dr. Carlis Abbott. 2/4 stat u/s  with recurrent PSA with active bleeding into PSA. S/p PSA repair with VAC placement.  - Discussed with Vascular ok to ambulate.  - Vac management per [facility]VS.  6. AKI on CKD stage 3b: Baseline 1.3-1.5.  Creatinine has trended up to 2.06. Milrinone restarted for low output. SCr trending back down   - Continue milrinone 0.125 as above  7. Acute blood loss anemia:  Hgb stable 8.1.  8. Atrial fibrillation: Paroxysmal.  - Continue heparin gtt, groin site stable.  - Continue amiodarone.   9. VT Arrest: 2/7, DCCV x 1 with CPR, ROSC after 2 minutes.  Polymorphic VT, appeared bradycardia-mediated with intermittent junctional rhythm. - Overdrive pacing with TTVP in place. Rate 80.  - Plan for  DDD ICD today  - Continue amiodarone.   Lyda Jester, PA-C  01/23/2023

## 2023-01-23 NOTE — Interval H&P Note (Signed)
History and Physical Interval Note:  01/23/2023 12:36 PM  Natasha Woodard  has presented today for surgery, with the diagnosis of cardiac arrest.  The various methods of treatment have been discussed with the patient and family. After consideration of risks, benefits and other options for treatment, the patient has consented to  Procedure(s): ICD IMPLANT (N/A) as a surgical intervention.  The patient's history has been reviewed, patient examined, no change in status, stable for surgery.  I have reviewed the patient's chart and labs.  Questions were answered to the patient's satisfaction.     Natasha Woodard

## 2023-01-24 ENCOUNTER — Inpatient Hospital Stay (HOSPITAL_COMMUNITY): Payer: Medicare Other

## 2023-01-24 ENCOUNTER — Encounter (HOSPITAL_COMMUNITY): Payer: Self-pay | Admitting: Internal Medicine

## 2023-01-24 DIAGNOSIS — I5023 Acute on chronic systolic (congestive) heart failure: Secondary | ICD-10-CM | POA: Diagnosis not present

## 2023-01-24 DIAGNOSIS — I4901 Ventricular fibrillation: Secondary | ICD-10-CM | POA: Diagnosis not present

## 2023-01-24 DIAGNOSIS — R570 Cardiogenic shock: Secondary | ICD-10-CM | POA: Diagnosis not present

## 2023-01-24 DIAGNOSIS — I214 Non-ST elevation (NSTEMI) myocardial infarction: Secondary | ICD-10-CM | POA: Diagnosis not present

## 2023-01-24 LAB — CREATININE, SERUM
Creatinine, Ser: 2.06 mg/dL — ABNORMAL HIGH (ref 0.44–1.00)
GFR, Estimated: 24 mL/min — ABNORMAL LOW (ref >60)

## 2023-01-24 LAB — BASIC METABOLIC PANEL
Anion gap: 13 (ref 5–15)
BUN: 40 mg/dL — ABNORMAL HIGH (ref 8–23)
CO2: 24 mmol/L (ref 22–32)
Calcium: 8.8 mg/dL — ABNORMAL LOW (ref 8.9–10.3)
Chloride: 101 mmol/L (ref 98–111)
Creatinine, Ser: 1.97 mg/dL — ABNORMAL HIGH (ref 0.44–1.00)
GFR, Estimated: 25 mL/min — ABNORMAL LOW (ref >60)
Glucose, Bld: 147 mg/dL — ABNORMAL HIGH (ref 70–99)
Potassium: 4 mmol/L (ref 3.5–5.1)
Sodium: 138 mmol/L (ref 135–145)

## 2023-01-24 LAB — CBC
HCT: 25 % — ABNORMAL LOW (ref 36.0–46.0)
Hemoglobin: 7.9 g/dL — ABNORMAL LOW (ref 12.0–15.0)
MCH: 30 pg (ref 26.0–34.0)
MCHC: 31.6 g/dL (ref 30.0–36.0)
MCV: 95.1 fL (ref 80.0–100.0)
Platelets: 518 10*3/uL — ABNORMAL HIGH (ref 150–400)
RBC: 2.63 MIL/uL — ABNORMAL LOW (ref 3.87–5.11)
RDW: 16.7 % — ABNORMAL HIGH (ref 11.5–15.5)
WBC: 11.2 10*3/uL — ABNORMAL HIGH (ref 4.0–10.5)
nRBC: 0 % (ref 0.0–0.2)

## 2023-01-24 LAB — GLUCOSE, CAPILLARY
Glucose-Capillary: 116 mg/dL — ABNORMAL HIGH (ref 70–99)
Glucose-Capillary: 165 mg/dL — ABNORMAL HIGH (ref 70–99)
Glucose-Capillary: 138 mg/dL — ABNORMAL HIGH (ref 70–99)
Glucose-Capillary: 121 mg/dL — ABNORMAL HIGH (ref 70–99)

## 2023-01-24 MED ORDER — CEFAZOLIN SODIUM-DEXTROSE 2-4 GM/100ML-% IV SOLN
2.0000 g | Freq: Two times a day (BID) | INTRAVENOUS | Status: DC
  Administered 2023-01-24 – 2023-01-29 (×10): 2 g via INTRAVENOUS
  Filled 2023-01-24 (×10): qty 100

## 2023-01-24 MED ORDER — AMIODARONE HCL 200 MG PO TABS
200.0000 mg | ORAL_TABLET | Freq: Two times a day (BID) | ORAL | Status: DC
  Administered 2023-01-24 – 2023-01-30 (×13): 200 mg via ORAL
  Filled 2023-01-24 (×13): qty 1

## 2023-01-24 MED ORDER — CEFAZOLIN SODIUM-DEXTROSE 1-4 GM/50ML-% IV SOLN
1.0000 g | Freq: Two times a day (BID) | INTRAVENOUS | Status: DC
  Administered 2023-01-24: 1 g via INTRAVENOUS
  Filled 2023-01-24: qty 50

## 2023-01-24 MED ORDER — MIDODRINE HCL 5 MG PO TABS
5.0000 mg | ORAL_TABLET | Freq: Three times a day (TID) | ORAL | Status: DC
  Administered 2023-01-24 (×2): 5 mg via ORAL
  Filled 2023-01-24 (×2): qty 1

## 2023-01-24 MED ORDER — HYDROCORTISONE 1 % EX CREA
TOPICAL_CREAM | CUTANEOUS | Status: DC | PRN
  Filled 2023-01-24 (×3): qty 28

## 2023-01-24 NOTE — Progress Notes (Signed)
PT Cancellation Note  Patient Details Name: Natasha Woodard MRN: [id number] DOB: January 28, 1941   Cancelled Treatment:    Reason Eval/Treat Not Completed: Medical issues which prohibited therapy. Pt on bedrest per vascular due to rt groin wound. Plan for back to surgery later this week. Will continue to monitor for incr activity status.    Shary Decamp [facility] 01/24/2023, 11:25 AM [place] Office [phone]

## 2023-01-24 NOTE — Progress Notes (Addendum)
Electrophysiology Rounding Note  Patient Name: Natasha Woodard Date of Encounter: 01/24/2023  Primary Cardiologist: None Electrophysiologist: Dr. Lovena Le   Subjective   The patient is doing well today.  At this time, the patient denies chest pain, shortness of breath, or any new concerns.  Inpatient Medications    Scheduled Meds:  acetaminophen  650 mg Oral Q6H   aspirin  81 mg Oral Daily   Chlorhexidine Gluconate Cloth  6 each Topical Daily   clopidogrel  75 mg Oral Daily   empagliflozin  10 mg Oral Daily   fentaNYL (SUBLIMAZE) injection  50 mcg Intravenous Once   insulin aspart  0-15 Units Subcutaneous TID WC   lidocaine  1 patch Transdermal Q24H   lidocaine  1 patch Transdermal Q24H   midodrine  10 mg Oral TID WC   rosuvastatin  10 mg Oral QHS   sodium chloride flush  10-40 mL Intracatheter Q12H   sodium chloride flush  3 mL Intravenous Q12H   sodium chloride flush  3 mL Intravenous Q12H   Continuous Infusions:  sodium chloride 250 mL (01/23/23 1235)   amiodarone 30 mg/hr (01/24/23 0700)    ceFAZolin (ANCEF) IV     milrinone 0.125 mcg/kg/min (01/24/23 0700)   PRN Meds: sodium chloride, acetaminophen, alum & mag hydroxide-simeth, ipratropium-albuterol, nitroGLYCERIN, ondansetron (ZOFRAN) IV, mouth rinse, oxyCODONE, sodium chloride flush, sodium chloride flush, traMADol   Vital Signs    Vitals:   01/24/23 0500 01/24/23 0600 01/24/23 0700 01/24/23 0757  BP: (!) 121/52 (!) 107/56 (!) 121/58   Pulse: 70 70 69   Resp: (!) 25 (!) 24 (!) 26   Temp:    99 F (37.2 C)  TempSrc:    Oral  SpO2: 96% 97% 95%   Weight:      Height:        Intake/Output Summary (Last 24 hours) at 01/24/2023 0832 Last data filed at 01/24/2023 0700 Gross per 24 hour  Intake 711.98 ml  Output 745 ml  Net -33.02 ml   Filed Weights   01/21/23 0600 01/22/23 0500 01/23/23 0500  Weight: 71.2 kg 74 kg 72.2 kg    Physical Exam    GEN- The patient is well appearing, alert and oriented  x 3 today.   HEENT- No gross abnormality.  Lungs- Clear to ausculation bilaterally, normal work of breathing Heart-  Regular  rate and rhythm, no murmurs, rubs or gallops GI- soft, NT, ND, + BS Extremities- no clubbing or cyanosis. No edema Neuro- No obvious focal abnormality.   Telemetry    A pacing 60-70s (personally reviewed)   Patient Profile     82 y.o. 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  Will be unable to interrupt DAPT. Continue pressure dressing, will re-assess prior to discharge.  Avoid heparin.    CAD Shock ICM Per HF team.  Milrinone running peripherally. Would avoid central access if possible, given infection risk.    R groin PSA rupture > repaired Acute blood loss anemia, stable Wound vac  discontinued.  Yesterday was described as dry and stable; Today with drainage.  Starting prophylactic IV antibiotics per vascular team.    7. AFib New for her this admission.  CHA2DS2Vasc is 7 Would delay OAC start at least 1 week given fresh device. AVOID HEPARIN.   EP will see as needed while remains here. I will follow her site for pressure dressing removal and possible re-application prior to discharge.   For questions or updates, please contact [place] Please consult [email or web address] for contact info under Cardiology/STEMI.  Signed, Shirley Friar, PA-C  01/24/2023, 8:32 AM   EP Attending  Patient seen and examined. Agree with the findings as noted above. The patient is stable s/p DDD ICD  insertion. The patient is doing well from device perspective. Her Atrial lead threshold is stable but was elevated at implant. She is atrial pacing.  Carleene Overlie Abdirahim Flavell,MD

## 2023-01-24 NOTE — Progress Notes (Signed)
Inpatient Rehabilitation Admissions Coordinator   Noted return to OR Thursday for washout. I continue to follow her progress at a distance. Not at a level to begin rehab assessment at this time.  Danne Baxter, RN, MSN Rehab Admissions Coordinator [phone] 01/24/2023 11:55 AM

## 2023-01-24 NOTE — Progress Notes (Signed)
Patient ID: Natasha Woodard, female   DOB: 1941-09-27, 82 y.o.   MRN: [id number]     Advanced Heart Failure Rounding Note  PCP-Cardiologist: Dr. Ellyn Hack AHF: Dr. 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 Now admitted for acute lateral wall MI 2/2 distal LCx occlusion c/b severe ischemic MR and acute systolic heart failure w/ low output, CI 2.1 on RHC. Required placement of IABP, but later removed given concerns for developing limb ischemia=>found to have Right femoral PSA then ultimate rupture s/p emergent repair. VT arrest 2/8. ICD placed 2/12.   Subjective:   1/31 Underwent thrombin injection to right femoral PSA . Hgb drifting down 10.3>8.3>6.8  2/1 Started on amio drip to suppress PVCs. Got dose of IV lasix x1. CVP low.  2/4 Ruptured PSA. Returned to OR for repair. VAC in place 2/5 Midodrine 5 mg three times daily added.  Back in A fib.  2/7 TEE with mod MR.  2/8 VT arrest--> shock x1 ROSC after 2 minutes. Given lidocaine and amio was increased to 60 mg.  2/9 Transvenous pacemaker placed for overdrive pacing with PMVT.  2/11 Milrinone restarted, Co-ox 49% 2/12 PICC removed. S/p Boston Sci ICD   Stable post ICD placement. CXR pending. A- paced 70s.   Remains on Milrinone 0.125 + amio gtt 30/hr. BMP pending.    Feels well this morning. Denies dyspnea. No CP. Wt down 4 lb  Objective:   Weight Range: 72.2 kg Body mass index is 29.11 kg/m.   Vital Signs:   Temp:  [97.8 F (36.6 C)-99 F (37.2 C)] 99 F (37.2 C) (02/13 0757) Pulse Rate:  [0-87] 69 (02/13 0700) Resp:  [7-28] 26 (02/13 0700) BP: (101-135)/(45-102) 121/58 (02/13 0700) SpO2:  [90 %-99 %] 95 % (02/13 0700) Last BM Date : 01/23/23  Weight change: Filed Weights   01/21/23 0600 01/22/23 0500 01/23/23 0500  Weight: 71.2 kg 74 kg 72.2 kg    Intake/Output:   Intake/Output Summary (Last 24 hours) at 01/24/2023 0937 Last  data filed at 01/24/2023 0700 Gross per 24 hour  Intake 692.51 ml  Output 670 ml  Net 22.51 ml     Physical Exam   General:  Well appearing. No respiratory difficulty HEENT: normal Neck: supple. JVD 7 cm. Carotids 2+ bilat; no bruits. No lymphadenopathy or thyromegaly appreciated. Cor: PMI nondisplaced. Regular rate & rhythm. No rubs, gallops or murmurs. ICD site bandaged  Lungs: clear Abdomen: soft, nontender, nondistended. No hepatosplenomegaly. No bruits or masses. Good bowel sounds. Extremities: no cyanosis, clubbing, rash, edema Neuro: alert & oriented x 3, cranial nerves grossly intact. moves all 4 extremities w/o difficulty. Affect pleasant.   Telemetry   A paced 70s.  Personally reviewed.   Labs    CBC Recent Labs    01/23/23 0500 01/24/23 0634  WBC 11.4* 11.2*  HGB 8.1* 7.9*  HCT 26.3* 25.0*  MCV 96.3 95.1  PLT 548* 0000000*   Basic Metabolic Panel Recent Labs    01/22/23 1600 01/23/23 0500 01/24/23 0634  NA 133* 136  --   K 4.6 4.1  --   CL 97* 97*  --   CO2 26 27  --   GLUCOSE 153* 200*  --   BUN 41* 40*  --   CREATININE 2.00* 1.97* 2.06*  CALCIUM 8.4* 8.5*  --   MG  --  2.1  --  Liver Function Tests No results for input(s): "AST", "ALT", "ALKPHOS", "BILITOT", "PROT", "ALBUMIN" in the last 72 hours.   No results for input(s): "LIPASE", "AMYLASE" in the last 72 hours. Cardiac Enzymes No results for input(s): "CKTOTAL", "CKMB", "CKMBINDEX", "TROPONINI" in the last 72 hours.  BNP: BNP (last 3 results) Recent Labs    06/17/22 1000 08/31/22 1613 01/10/23 1048  BNP 1,397.5* 1,178.5* >4,500.0*    ProBNP (last 3 results) Recent Labs    06/09/22 1506  PROBNP 4,638*     Imaging    EP PPM/ICD IMPLANT  Result Date: 01/23/2023 Conclusion: Successful insertion of a [employer] dual-chamber ICD in a patient with an ischemic cardiomyopathy, chronic systolic heart failure, marked sinus node dysfunction and polymorphic ventricular  tachycardia. [name], MD     Medications:     Scheduled Medications:  acetaminophen  650 mg Oral Q6H   amiodarone  200 mg Oral BID   aspirin  81 mg Oral Daily   Chlorhexidine Gluconate Cloth  6 each Topical Daily   clopidogrel  75 mg Oral Daily   empagliflozin  10 mg Oral Daily   fentaNYL (SUBLIMAZE) injection  50 mcg Intravenous Once   insulin aspart  0-15 Units Subcutaneous TID WC   lidocaine  1 patch Transdermal Q24H   lidocaine  1 patch Transdermal Q24H   midodrine  10 mg Oral TID WC   rosuvastatin  10 mg Oral QHS   sodium chloride flush  10-40 mL Intracatheter Q12H   sodium chloride flush  3 mL Intravenous Q12H   sodium chloride flush  3 mL Intravenous Q12H    Infusions:  sodium chloride 250 mL (01/23/23 1235)    ceFAZolin (ANCEF) IV     milrinone 0.125 mcg/kg/min (01/24/23 0700)    PRN Medications: sodium chloride, acetaminophen, alum & mag hydroxide-simeth, ipratropium-albuterol, nitroGLYCERIN, ondansetron (ZOFRAN) IV, mouth rinse, oxyCODONE, sodium chloride flush, sodium chloride flush, traMADol    Assessment/Plan   1. Cardiogenic shock/acute on chronic systolic CHF: Echo this admission with EF 20-25%, mild LV dilation, mild RV dysfunction, severe MR with restricted posterior leaflet.  Echo in 2/23 with EF 50-55%.  Patient has history of nonischemic cardiomyopathy (?PVC-mediated) that improved.  She reports 2 months of dyspnea then had lateral STEMI 1/30 with chest pain. I do not think that PLOM occlusion can explain the extent of her cardiomyopathy, so suspect EF may have been falling prior to this event (symptomatic at least 2 months).  Suspect mixed ischemic/nonischemic cardiomyopathy.  She initially had IABP for cardiogenic shock, now removed due to right leg ischemia. Was weaned off pressors/inotropes but Milrinone restarted 2/11 for low Co-ox and rising SCr. Today on Milrinone 0.125. No central access, PICC was removed prior to ICD placement. BMP pending. Feels  well today. Euvolemic on exam - If Scr ok on BMP, will plan to d/c Milrinone today   - Continue Jardiance 10 mg   - additional GDMT limited by hypotension and AKI  - SBPs now 120s-130s, will wean midodrine to 5 mg tid.  - S/p  DDD ICD. Appreciate EP  2. Mitral regurgitation: severe MR with restricted posterior leaflet on initial echo, suspect infarct-related MR with PLOM occlusion. TEE was later done showing moderate MR.  3. CAD: Lateral STEMI with occluded pLOM, treated with DES.   - Continue ASA 81 - Switched from Brilinta to plavix.  - Continue statin.  4. 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  - On Amiodarone gtt. Will switch  to PO today, 200 mg bid  5. Right femoral PSA: s/p thrombin injection by Dr. Carlis Abbott. 2/4 stat u/s with recurrent PSA with active bleeding into PSA. S/p PSA repair with VAC placement.  - Discussed with Vascular ok to ambulate.  - Vac management per VVS.  6. AKI on CKD stage 3b: Baseline 1.3-1.5.  Creatinine has trended up to 2.06. Milrinone restarted for low output. F/u BMP pending. If SCr stable/improving will d/c milrinone  7. Acute blood loss anemia:  Hgb 7.9 - monitor closely  8. Atrial fibrillation: Paroxysmal.  - Per EP hold a/c x 1 wk post ICD placement. No heparin  - Amiodarone 200 mg bid  9. VT Arrest: 2/7, DCCV x 1 with CPR, ROSC after 2 minutes.  Polymorphic VT, appeared bradycardia-mediated with intermittent junctional rhythm. - s/p DDD ICD on 2/12 - Continue amiodarone.   Lyda Jester, PA-C  01/24/2023

## 2023-01-24 NOTE — Progress Notes (Addendum)
  Progress Note    01/24/2023 8:17 AM 1 Day Post-Op  Subjective:  no complaints   Vitals:   01/24/23 0700 01/24/23 0757  BP: (!) 121/58   Pulse: 69   Resp: (!) 26   Temp:  99 F (37.2 C)  SpO2: 95%    Physical Exam: Lungs:  non labored Incisions:  R groin with turbid/serosanguinous drainage with manipulation Neurologic: A&O  CBC    Component Value Date/Time   WBC 11.2 (H) 01/24/2023 0634   RBC 2.63 (L) 01/24/2023 0634   HGB 7.9 (L) 01/24/2023 0634   HGB 14.2 10/09/2020 0935   HCT 25.0 (L) 01/24/2023 0634   HCT 44.9 10/09/2020 0935   PLT 518 (H) 01/24/2023 0634   PLT 379 10/09/2020 0935   MCV 95.1 01/24/2023 0634   MCV 91 10/09/2020 0935   MCH 30.0 01/24/2023 0634   MCHC 31.6 01/24/2023 0634   RDW 16.7 (H) 01/24/2023 0634   RDW 13.4 10/09/2020 0935   LYMPHSABS 0.9 01/15/2023 0950   LYMPHSABS 1.1 10/09/2020 0935   MONOABS 1.4 (H) 01/15/2023 0950   EOSABS 0.6 (H) 01/15/2023 0950   EOSABS 0.2 10/09/2020 0935   BASOSABS 0.1 01/15/2023 0950   BASOSABS 0.1 10/09/2020 0935    BMET    Component Value Date/Time   NA 136 01/23/2023 0500   NA 145 (H) 07/07/2022 1007   K 4.1 01/23/2023 0500   CL 97 (L) 01/23/2023 0500   CO2 27 01/23/2023 0500   GLUCOSE 200 (H) 01/23/2023 0500   BUN 40 (H) 01/23/2023 0500   BUN 24 07/07/2022 1007   CREATININE 2.06 (H) 01/24/2023 0634   CREATININE 1.28 (H) 01/14/2022 1551   CALCIUM 8.5 (L) 01/23/2023 0500   GFRNONAA 24 (L) 01/24/2023 0634   GFRAA 49 (L) 10/09/2020 0933    INR    Component Value Date/Time   INR 1.4 (H) 01/10/2023 2145     Intake/Output Summary (Last 24 hours) at 01/24/2023 0817 Last data filed at 01/24/2023 0700 Gross per 24 hour  Intake 711.98 ml  Output 745 ml  Net -33.02 ml     Assessment/Plan:  82 y.o. female is s/p pseudoaneurysm repair R groin 1 Day Post-Op   R groin incision now draining turbid/serosanguinous fluid.  We will start prophylactic IV antibiotics.  Plan will be to remove several  staples to place a wound vac later this morning.  She is at high risk for further incision breakdown.   Dagoberto Ligas, PA-C [facility] [phone] 01/24/2023 8:17 AM  VASCULAR STAFF ADDENDUM: I have independently interviewed and examined the patient. I agree with the above.  Drainage, staples popped, fat necrosis appreciated.  VAC placed.  Plan for OR Thursday for washout. No concern for infection at this time.    Cassandria Santee, MD [facility] Phone Number: [phone] 01/24/2023 11:29 AM

## 2023-01-24 NOTE — Progress Notes (Signed)
OT Cancellation Note  Patient Details Name: Natasha Woodard MRN: [id number] DOB: [birth year]   Cancelled Treatment:    Reason Eval/Treat Not Completed: Medical issues which prohibited therapy.  Scheduled for return to OT on Thursday, with bedrest til then.. Continue efforts as appropriate.    Champayne Kocian D Avontae Burkhead 01/24/2023, 11:59 AM 01/24/2023  RP, OTR/L  Acute Rehabilitation Services  Office:  [phone]

## 2023-01-25 ENCOUNTER — Ambulatory Visit: Payer: Medicare Other | Admitting: Pulmonary Disease

## 2023-01-25 DIAGNOSIS — R570 Cardiogenic shock: Secondary | ICD-10-CM | POA: Diagnosis not present

## 2023-01-25 DIAGNOSIS — I214 Non-ST elevation (NSTEMI) myocardial infarction: Secondary | ICD-10-CM | POA: Diagnosis not present

## 2023-01-25 DIAGNOSIS — I5023 Acute on chronic systolic (congestive) heart failure: Secondary | ICD-10-CM | POA: Diagnosis not present

## 2023-01-25 LAB — GLUCOSE, CAPILLARY
Glucose-Capillary: 93 mg/dL (ref 70–99)
Glucose-Capillary: 174 mg/dL — ABNORMAL HIGH (ref 70–99)
Glucose-Capillary: 88 mg/dL (ref 70–99)
Glucose-Capillary: 146 mg/dL — ABNORMAL HIGH (ref 70–99)

## 2023-01-25 LAB — CBC
HCT: 26.5 % — ABNORMAL LOW (ref 36.0–46.0)
Hemoglobin: 8.2 g/dL — ABNORMAL LOW (ref 12.0–15.0)
MCH: 29.6 pg (ref 26.0–34.0)
MCHC: 30.9 g/dL (ref 30.0–36.0)
MCV: 95.7 fL (ref 80.0–100.0)
Platelets: 475 10*3/uL — ABNORMAL HIGH (ref 150–400)
RBC: 2.77 MIL/uL — ABNORMAL LOW (ref 3.87–5.11)
RDW: 17.1 % — ABNORMAL HIGH (ref 11.5–15.5)
WBC: 9.7 10*3/uL (ref 4.0–10.5)
nRBC: 0 % (ref 0.0–0.2)

## 2023-01-25 LAB — BASIC METABOLIC PANEL
Anion gap: 11 (ref 5–15)
BUN: 40 mg/dL — ABNORMAL HIGH (ref 8–23)
CO2: 26 mmol/L (ref 22–32)
Calcium: 8.8 mg/dL — ABNORMAL LOW (ref 8.9–10.3)
Chloride: 101 mmol/L (ref 98–111)
Creatinine, Ser: 1.85 mg/dL — ABNORMAL HIGH (ref 0.44–1.00)
GFR, Estimated: 27 mL/min — ABNORMAL LOW (ref >60)
Glucose, Bld: 99 mg/dL (ref 70–99)
Potassium: 4.1 mmol/L (ref 3.5–5.1)
Sodium: 138 mmol/L (ref 135–145)

## 2023-01-25 LAB — MAGNESIUM: Magnesium: 2.3 mg/dL (ref 1.7–2.4)

## 2023-01-25 LAB — ECHO TEE: Radius: 0.75 cm

## 2023-01-25 MED ORDER — VANCOMYCIN HCL IN DEXTROSE 1-5 GM/200ML-% IV SOLN
1000.0000 mg | INTRAVENOUS | Status: AC
  Filled 2023-01-25: qty 200

## 2023-01-25 NOTE — Progress Notes (Signed)
  Progress Note    01/25/2023 8:23 AM 2 Days Post-Op  Subjective:  no complaints   Vitals:   01/25/23 0600 01/25/23 0700  BP: (!) 121/58 (!) 108/49  Pulse: 70 73  Resp: (!) 22 (!) 22  Temp:    SpO2: 95% 95%   Physical Exam: Lungs:  non labored Incisions:  R groin with wound vac in place, good seal Extremities:  feet warm Abdomen:  soft Neurologic: A&O  CBC    Component Value Date/Time   WBC 9.7 01/25/2023 0633   RBC 2.77 (L) 01/25/2023 0633   HGB 8.2 (L) 01/25/2023 0633   HGB 14.2 10/09/2020 0935   HCT 26.5 (L) 01/25/2023 0633   HCT 44.9 10/09/2020 0935   PLT 475 (H) 01/25/2023 0633   PLT 379 10/09/2020 0935   MCV 95.7 01/25/2023 0633   MCV 91 10/09/2020 0935   MCH 29.6 01/25/2023 0633   MCHC 30.9 01/25/2023 0633   RDW 17.1 (H) 01/25/2023 0633   RDW 13.4 10/09/2020 0935   LYMPHSABS 0.9 01/15/2023 0950   LYMPHSABS 1.1 10/09/2020 0935   MONOABS 1.4 (H) 01/15/2023 0950   EOSABS 0.6 (H) 01/15/2023 0950   EOSABS 0.2 10/09/2020 0935   BASOSABS 0.1 01/15/2023 0950   BASOSABS 0.1 10/09/2020 0935    BMET    Component Value Date/Time   NA 138 01/25/2023 0633   NA 145 (H) 07/07/2022 1007   K 4.1 01/25/2023 0633   CL 101 01/25/2023 0633   CO2 26 01/25/2023 0633   GLUCOSE 99 01/25/2023 0633   BUN 40 (H) 01/25/2023 0633   BUN 24 07/07/2022 1007   CREATININE 1.85 (H) 01/25/2023 0633   CREATININE 1.28 (H) 01/14/2022 1551   CALCIUM 8.8 (L) 01/25/2023 0633   GFRNONAA 27 (L) 01/25/2023 0633   GFRAA 49 (L) 10/09/2020 0933    INR    Component Value Date/Time   INR 1.4 (H) 01/10/2023 2145     Intake/Output Summary (Last 24 hours) at 01/25/2023 1324 Last data filed at 01/25/2023 0600 Gross per 24 hour  Intake 448.13 ml  Output 800 ml  Net -351.87 ml     Assessment/Plan:  82 y.o. female is s/p repair of pseudoaneurysm R groin 2 Days Post-Op   Staples removed and wound vac place in R groin yesterday due to incision breakdown due to underlying tissue  necrosis.  Vac has a good seal this morning.  Minimal serosanguinous collection in cannister.  Plan is for return to the OR tomorrow with Dr. Virl Cagey for R groin washout and debridement with vac placement.  All questions answered this morning.  NPO past midnight.  Consent ordered.   Dagoberto Ligas, PA-C [facility] [phone] 01/25/2023 8:23 AM

## 2023-01-25 NOTE — Progress Notes (Addendum)
Patient ID: Natasha Woodard, female   DOB: [birth year], 82 y.o.   MRN: [id number]     Advanced Heart Failure Rounding Note  PCP-Cardiologist: Dr. Ellyn Hack AHF: Dr. 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 Now admitted for acute lateral wall MI 2/2 distal LCx occlusion c/b severe ischemic MR and acute systolic heart failure w/ low output, CI 2.1 on RHC. Required placement of IABP, but later removed given concerns for developing limb ischemia=>found to have Right femoral PSA then ultimate rupture s/p emergent repair. VT arrest 2/8. ICD placed 2/12.   Subjective:   1/31 Underwent thrombin injection to right femoral PSA . Hgb drifting down 10.3>8.3>6.8  2/1 Started on amio drip to suppress PVCs. Got dose of IV lasix x1. CVP low.  2/4 Ruptured PSA. Returned to OR for repair. VAC in place 2/5 Midodrine 5 mg three times daily added.  Back in A fib.  2/7 TEE with mod MR.  2/8 VT arrest--> shock x1 ROSC after 2 minutes. Given lidocaine and amio was increased to 60 mg.  2/9 Transvenous pacemaker placed for overdrive pacing with PMVT.  2/11 Milrinone restarted, Co-ox 49% 2/12 PICC removed. S/p [employer] Sci ICD   A- paced 70s.   Remains on Milrinone 0.125. Scr down 2.06>>1.85   Going back to OR tomorrow for washout of rt groin incision. On Ancef.   Feels good today. Had a good breakfast. No CP or dyspnea.   Objective:   Weight Range: 72.2 kg Body mass index is 29.11 kg/m.   Vital Signs:   Temp:  [98 F (36.7 C)-99.1 F (37.3 C)] 98 F (36.7 C) (02/14 0400) Pulse Rate:  [67-84] 73 (02/14 0700) Resp:  [5-27] 22 (02/14 0700) BP: (99-126)/(46-99) 108/49 (02/14 0700) SpO2:  [88 %-97 %] 95 % (02/14 0700) Last BM Date : 01/24/23  Weight change: Filed Weights   01/21/23 0600 01/22/23 0500 01/23/23 0500  Weight: 71.2 kg 74 kg 72.2 kg    Intake/Output:   Intake/Output Summary (Last 24 hours) at  01/25/2023 0749 Last data filed at 01/25/2023 0600 Gross per 24 hour  Intake 688.13 ml  Output 850 ml  Net -161.87 ml     Physical Exam   General:  Well appearing. No respiratory difficulty HEENT: normal Neck: supple. no JVD. Carotids 2+ bilat; no bruits. No lymphadenopathy or thyromegaly appreciated. Cor: PMI nondisplaced. Regular rate & rhythm. No rubs, gallops or murmurs. Lungs: clear Abdomen: soft, nontender, nondistended. No hepatosplenomegaly. No bruits or masses. Good bowel sounds. Extremities: no cyanosis, clubbing, rash, edema Neuro: alert & oriented x 3, cranial nerves grossly intact. moves all 4 extremities w/o difficulty. Affect pleasant.   Telemetry   A paced 70s.  Personally reviewed.   Labs    CBC Recent Labs    01/24/23 0634 01/25/23 0633  WBC 11.2* 9.7  HGB 7.9* 8.2*  HCT 25.0* 26.5*  MCV 95.1 95.7  PLT 518* [id number]*   Basic Metabolic Panel Recent Labs    01/23/23 0500 01/24/23 0634 01/24/23 1150  NA 136  --  138  K 4.1  --  4.0  CL 97*  --  101  CO2 27  --  24  GLUCOSE 200*  --  147*  BUN 40*  --  40*  CREATININE 1.97* 2.06* 1.97*  CALCIUM 8.5*  --  8.8*  MG 2.1  --   --  Liver Function Tests No results for input(s): "AST", "ALT", "ALKPHOS", "BILITOT", "PROT", "ALBUMIN" in the last 72 hours.   No results for input(s): "LIPASE", "AMYLASE" in the last 72 hours. Cardiac Enzymes No results for input(s): "CKTOTAL", "CKMB", "CKMBINDEX", "TROPONINI" in the last 72 hours.  BNP: BNP (last 3 results) Recent Labs    06/17/22 1000 08/31/22 1613 01/10/23 1048  BNP 1,397.5* 1,178.5* >4,500.0*    ProBNP (last 3 results) Recent Labs    06/09/22 1506  PROBNP 4,638*     Imaging    DG CHEST PORT 1 VIEW  Result Date: 01/24/2023 CLINICAL DATA:  Placement of cardiac defibrillator. EXAM: PORTABLE CHEST 1 VIEW COMPARISON:  January 10, 2023. FINDINGS: Stable cardiomediastinal silhouette. Interval placement of left-sided defibrillator with leads  in grossly good position. No pneumothorax is noted. Both lungs are clear. The visualized skeletal structures are unremarkable. IMPRESSION: Interval placement of left-sided defibrillator with leads in grossly good position. Electronically Signed   By: Marijo Conception M.D.   On: 01/24/2023 17:32     Medications:     Scheduled Medications:  acetaminophen  650 mg Oral Q6H   amiodarone  200 mg Oral BID   aspirin  81 mg Oral Daily   Chlorhexidine Gluconate Cloth  6 each Topical Daily   clopidogrel  75 mg Oral Daily   fentaNYL (SUBLIMAZE) injection  50 mcg Intravenous Once   insulin aspart  0-15 Units Subcutaneous TID WC   lidocaine  1 patch Transdermal Q24H   lidocaine  1 patch Transdermal Q24H   midodrine  5 mg Oral TID WC   rosuvastatin  10 mg Oral QHS   sodium chloride flush  10-40 mL Intracatheter Q12H   sodium chloride flush  3 mL Intravenous Q12H   sodium chloride flush  3 mL Intravenous Q12H    Infusions:  sodium chloride 250 mL (01/23/23 1235)    ceFAZolin (ANCEF) IV Stopped (01/24/23 2137)   milrinone 0.125 mcg/kg/min (01/25/23 0600)    PRN Medications: sodium chloride, acetaminophen, alum & mag hydroxide-simeth, hydrocortisone cream, ipratropium-albuterol, nitroGLYCERIN, ondansetron (ZOFRAN) IV, mouth rinse, oxyCODONE, sodium chloride flush, sodium chloride flush, traMADol    Assessment/Plan   1. Cardiogenic shock/acute on chronic systolic CHF: Echo this admission with EF 20-25%, mild LV dilation, mild RV dysfunction, severe MR with restricted posterior leaflet.  Echo in 2/23 with EF 50-55%.  Patient has history of nonischemic cardiomyopathy (?PVC-mediated) that improved.  She reports 2 months of dyspnea then had lateral STEMI 1/30 with chest pain. I do not think that PLOM occlusion can explain the extent of her cardiomyopathy, so suspect EF may have been falling prior to this event (symptomatic at least 2 months).  Suspect mixed ischemic/nonischemic cardiomyopathy.  She  initially had IABP for cardiogenic shock, now removed due to right leg ischemia. Was weaned off pressors/inotropes but Milrinone restarted 2/11 for low Co-ox and rising SCr. Today on Milrinone 0.125. No central access, PICC was removed prior to ICD placement. SCr improving. Feels well today. Euvolemic on exam - Stop Milrinone today   - Hold Jardiance for now given bed-bound - additional GDMT initially limited by hypotension and AKI. BP and Scr improving. Tolerating midodrine wean  - stop midodrine today  - S/p  DDD ICD. Appreciate EP  2. Mitral regurgitation: severe MR with restricted posterior leaflet on initial echo, suspect infarct-related MR with PLOM occlusion. TEE was later done showing moderate MR.  3. CAD: Lateral STEMI with occluded pLOM, treated with DES.   - Continue  ASA 81 - Switched from Brilinta to plavix.  - Continue statin.  4. 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  - On Amiodarone gtt. Will switch to PO today, 200 mg bid  5. Right femoral PSA: s/p thrombin injection by Dr. Carlis Abbott. 2/4 stat u/s with recurrent PSA with active bleeding into PSA. S/p PSA repair with VAC placement.  - Plan return to OR 2/15 for wound washout  - On Ancef for perioperative coverage. Plan dose of Vanc prior to OR  6. AKI on CKD stage 3b: Baseline 1.3-1.5.  Creatinine trended up to 2.06. Milrinone restarted for low output. SCr improving, down to 1.85 today  7. Acute blood loss anemia:  Hgb 8.2 - monitor closely  8. Atrial fibrillation: Paroxysmal.  - Per EP hold a/c x 1 wk post ICD placement. No heparin  - Amiodarone 200 mg bid  9. VT Arrest: 2/7, DCCV x 1 with CPR, ROSC after 2 minutes.  Polymorphic VT, appeared bradycardia-mediated with intermittent junctional rhythm. - s/p DDD ICD on 2/12 - Continue amiodarone.   Lyda Jester, PA-C  01/25/2023

## 2023-01-26 ENCOUNTER — Inpatient Hospital Stay (HOSPITAL_COMMUNITY): Payer: Medicare Other | Admitting: Certified Registered Nurse Anesthetist

## 2023-01-26 ENCOUNTER — Other Ambulatory Visit: Payer: Self-pay

## 2023-01-26 ENCOUNTER — Encounter (HOSPITAL_COMMUNITY): Payer: Self-pay | Admitting: Internal Medicine

## 2023-01-26 ENCOUNTER — Encounter (HOSPITAL_COMMUNITY)
Admission: EM | Disposition: A | Discharge status: Rehabilitation Facility | Payer: Self-pay | Source: Home / Self Care | Attending: Cardiology

## 2023-01-26 ENCOUNTER — Encounter (HOSPITAL_COMMUNITY): Payer: Self-pay | Admitting: *Deleted

## 2023-01-26 DIAGNOSIS — I2510 Atherosclerotic heart disease of native coronary artery without angina pectoris: Secondary | ICD-10-CM

## 2023-01-26 DIAGNOSIS — I110 Hypertensive heart disease with heart failure: Secondary | ICD-10-CM | POA: Diagnosis not present

## 2023-01-26 DIAGNOSIS — I214 Non-ST elevation (NSTEMI) myocardial infarction: Secondary | ICD-10-CM | POA: Diagnosis not present

## 2023-01-26 DIAGNOSIS — I252 Old myocardial infarction: Secondary | ICD-10-CM | POA: Diagnosis not present

## 2023-01-26 DIAGNOSIS — E1151 Type 2 diabetes mellitus with diabetic peripheral angiopathy without gangrene: Secondary | ICD-10-CM

## 2023-01-26 DIAGNOSIS — I724 Aneurysm of artery of lower extremity: Secondary | ICD-10-CM | POA: Diagnosis not present

## 2023-01-26 DIAGNOSIS — S31109D Unspecified open wound of abdominal wall, unspecified quadrant without penetration into peritoneal cavity, subsequent encounter: Secondary | ICD-10-CM | POA: Diagnosis not present

## 2023-01-26 DIAGNOSIS — I509 Heart failure, unspecified: Secondary | ICD-10-CM

## 2023-01-26 DIAGNOSIS — Z7984 Long term (current) use of oral hypoglycemic drugs: Secondary | ICD-10-CM

## 2023-01-26 DIAGNOSIS — Z87891 Personal history of nicotine dependence: Secondary | ICD-10-CM

## 2023-01-26 HISTORY — PX: APPLICATION OF WOUND VAC: SHX5189

## 2023-01-26 LAB — GLUCOSE, CAPILLARY
Glucose-Capillary: 93 mg/dL (ref 70–99)
Glucose-Capillary: 128 mg/dL — ABNORMAL HIGH (ref 70–99)
Glucose-Capillary: 115 mg/dL — ABNORMAL HIGH (ref 70–99)
Glucose-Capillary: 146 mg/dL — ABNORMAL HIGH (ref 70–99)
Glucose-Capillary: 118 mg/dL — ABNORMAL HIGH (ref 70–99)

## 2023-01-26 LAB — BASIC METABOLIC PANEL
Anion gap: 12 (ref 5–15)
BUN: 38 mg/dL — ABNORMAL HIGH (ref 8–23)
CO2: 26 mmol/L (ref 22–32)
Calcium: 8.9 mg/dL (ref 8.9–10.3)
Chloride: 101 mmol/L (ref 98–111)
Creatinine, Ser: 1.8 mg/dL — ABNORMAL HIGH (ref 0.44–1.00)
GFR, Estimated: 28 mL/min — ABNORMAL LOW (ref >60)
Glucose, Bld: 100 mg/dL — ABNORMAL HIGH (ref 70–99)
Potassium: 4.3 mmol/L (ref 3.5–5.1)
Sodium: 139 mmol/L (ref 135–145)

## 2023-01-26 LAB — CBC
HCT: 26.9 % — ABNORMAL LOW (ref 36.0–46.0)
Hemoglobin: 8.4 g/dL — ABNORMAL LOW (ref 12.0–15.0)
MCH: 29.8 pg (ref 26.0–34.0)
MCHC: 31.2 g/dL (ref 30.0–36.0)
MCV: 95.4 fL (ref 80.0–100.0)
Platelets: 393 10*3/uL (ref 150–400)
RBC: 2.82 MIL/uL — ABNORMAL LOW (ref 3.87–5.11)
RDW: 17.6 % — ABNORMAL HIGH (ref 11.5–15.5)
WBC: 9 10*3/uL (ref 4.0–10.5)
nRBC: 0 % (ref 0.0–0.2)

## 2023-01-26 LAB — MAGNESIUM: Magnesium: 2.5 mg/dL — ABNORMAL HIGH (ref 1.7–2.4)

## 2023-01-26 SURGERY — APPLICATION, WOUND VAC
Anesthesia: General | Site: Groin | Laterality: Right

## 2023-01-26 MED ORDER — ORAL CARE MOUTH RINSE: 15.0000 mL | Freq: Once | OROMUCOSAL | Status: AC

## 2023-01-26 MED ORDER — FENTANYL CITRATE (PF) 250 MCG/5ML IJ SOLN
INTRAMUSCULAR | Status: DC | PRN
  Administered 2023-01-26 (×2): 25 ug via INTRAVENOUS

## 2023-01-26 MED ORDER — ACETAMINOPHEN 10 MG/ML IV SOLN: 1000.0000 mg | Freq: Once | INTRAVENOUS | Status: DC | PRN

## 2023-01-26 MED ORDER — CHLORHEXIDINE GLUCONATE 0.12 % MT SOLN
OROMUCOSAL | Status: AC
  Administered 2023-01-26: 15 mL via OROMUCOSAL
  Filled 2023-01-26: qty 15

## 2023-01-26 MED ORDER — ONDANSETRON HCL 4 MG/2ML IJ SOLN
INTRAMUSCULAR | Status: DC | PRN
  Administered 2023-01-26: 4 mg via INTRAVENOUS

## 2023-01-26 MED ORDER — LIDOCAINE 2% (20 MG/ML) 5 ML SYRINGE
INTRAMUSCULAR | Status: DC | PRN
  Administered 2023-01-26: 40 mg via INTRAVENOUS

## 2023-01-26 MED ORDER — SIMETHICONE 80 MG PO CHEW
80.0000 mg | CHEWABLE_TABLET | Freq: Four times a day (QID) | ORAL | Status: DC | PRN
  Administered 2023-01-26 – 2023-02-17 (×3): 80 mg via ORAL
  Filled 2023-01-26 (×3): qty 1

## 2023-01-26 MED ORDER — LACTATED RINGERS IV SOLN: INTRAVENOUS | Status: DC

## 2023-01-26 MED ORDER — FENTANYL CITRATE (PF) 250 MCG/5ML IJ SOLN
INTRAMUSCULAR | Status: AC
  Filled 2023-01-26: qty 5

## 2023-01-26 MED ORDER — 0.9 % SODIUM CHLORIDE (POUR BTL) OPTIME
TOPICAL | Status: DC | PRN
  Administered 2023-01-26: 2000 mL

## 2023-01-26 MED ORDER — FENTANYL CITRATE (PF) 100 MCG/2ML IJ SOLN: 25.0000 ug | INTRAMUSCULAR | Status: DC | PRN

## 2023-01-26 MED ORDER — CHLORHEXIDINE GLUCONATE 0.12 % MT SOLN: 15.0000 mL | Freq: Once | OROMUCOSAL | Status: AC

## 2023-01-26 MED ORDER — ROCURONIUM BROMIDE 10 MG/ML (PF) SYRINGE
PREFILLED_SYRINGE | INTRAVENOUS | Status: DC | PRN
  Administered 2023-01-26: 40 mg via INTRAVENOUS

## 2023-01-26 MED ORDER — ONDANSETRON HCL 4 MG/2ML IJ SOLN
INTRAMUSCULAR | Status: AC
  Filled 2023-01-26: qty 2

## 2023-01-26 MED ORDER — ACETAMINOPHEN 325 MG PO TABS: 325.0000 mg | ORAL_TABLET | ORAL | Status: DC | PRN

## 2023-01-26 MED ORDER — VANCOMYCIN HCL IN DEXTROSE 1-5 GM/200ML-% IV SOLN
INTRAVENOUS | Status: AC
  Administered 2023-01-26: 1000 mg via INTRAVENOUS
  Filled 2023-01-26: qty 200

## 2023-01-26 MED ORDER — ACETAMINOPHEN 160 MG/5ML PO SOLN: 325.0000 mg | ORAL | Status: DC | PRN

## 2023-01-26 MED ORDER — EPHEDRINE SULFATE-NACL 50-0.9 MG/10ML-% IV SOSY
PREFILLED_SYRINGE | INTRAVENOUS | Status: DC | PRN
  Administered 2023-01-26: 5 mg via INTRAVENOUS

## 2023-01-26 MED ORDER — SUGAMMADEX SODIUM 200 MG/2ML IV SOLN
INTRAVENOUS | Status: DC | PRN
  Administered 2023-01-26: 200 mg via INTRAVENOUS
  Administered 2023-01-26: 100 mg via INTRAVENOUS

## 2023-01-26 MED ORDER — DEXAMETHASONE SODIUM PHOSPHATE 10 MG/ML IJ SOLN
INTRAMUSCULAR | Status: AC
  Filled 2023-01-26: qty 1

## 2023-01-26 MED ORDER — PROPOFOL 10 MG/ML IV BOLUS
INTRAVENOUS | Status: DC | PRN
  Administered 2023-01-26: 60 mg via INTRAVENOUS

## 2023-01-26 MED ORDER — PHENYLEPHRINE 80 MCG/ML (10ML) SYRINGE FOR IV PUSH (FOR BLOOD PRESSURE SUPPORT)
PREFILLED_SYRINGE | INTRAVENOUS | Status: DC | PRN
  Administered 2023-01-26 (×5): 80 ug via INTRAVENOUS

## 2023-01-26 SURGICAL SUPPLY — 42 items
BAG COUNTER SPONGE SURGICOUNT (BAG) ×1 IMPLANT
BAG SPNG CNTER NS LX DISP (BAG) ×1
BENZOIN TINCTURE AMPULE (MISCELLANEOUS) IMPLANT
CANISTER SUCT 3000ML PPV (MISCELLANEOUS) ×1 IMPLANT
CANISTER WOUNDNEG PRESSURE 500 (CANNISTER) IMPLANT
COVER SURGICAL LIGHT HANDLE (MISCELLANEOUS) ×1 IMPLANT
DRAPE HALF SHEET 40X57 (DRAPES) IMPLANT
DRAPE INCISE IOBAN 66X45 STRL (DRAPES) ×1 IMPLANT
DRAPE ORTHO SPLIT 77X108 STRL (DRAPES) ×2
DRAPE SURG ORHT 6 SPLT 77X108 (DRAPES) ×1 IMPLANT
DRSG VAC ATS LRG SENSATRAC (GAUZE/BANDAGES/DRESSINGS) ×2 IMPLANT
DRSG VAC ATS MED SENSATRAC (GAUZE/BANDAGES/DRESSINGS) IMPLANT
ELECT REM PT RETURN 9FT ADLT (ELECTROSURGICAL) ×1
ELECTRODE REM PT RTRN 9FT ADLT (ELECTROSURGICAL) ×1 IMPLANT
GLOVE BIO SURGEON STRL SZ 6 (GLOVE) IMPLANT
GLOVE BIO SURGEON STRL SZ7.5 (GLOVE) IMPLANT
GLOVE BIOGEL PI IND STRL 6.5 (GLOVE) IMPLANT
GLOVE BIOGEL PI IND STRL 8 (GLOVE) ×1 IMPLANT
GOWN STRL REUS W/ TWL LRG LVL3 (GOWN DISPOSABLE) ×2 IMPLANT
GOWN STRL REUS W/ TWL XL LVL3 (GOWN DISPOSABLE) IMPLANT
GOWN STRL REUS W/TWL 2XL LVL3 (GOWN DISPOSABLE) ×2 IMPLANT
GOWN STRL REUS W/TWL LRG LVL3 (GOWN DISPOSABLE) ×2
GOWN STRL REUS W/TWL XL LVL3 (GOWN DISPOSABLE) ×1
KIT BASIN OR (CUSTOM PROCEDURE TRAY) ×1 IMPLANT
KIT REMOVER STAPLE SKIN (MISCELLANEOUS) IMPLANT
KIT TURNOVER KIT B (KITS) ×1 IMPLANT
NS IRRIG 1000ML POUR BTL (IV SOLUTION) ×1 IMPLANT
PACK GENERAL/GYN (CUSTOM PROCEDURE TRAY) ×1 IMPLANT
PAD ARMBOARD 7.5X6 YLW CONV (MISCELLANEOUS) ×2 IMPLANT
POWDER MYRIAD MORCELLS 1000MG (Miscellaneous) IMPLANT
SOL PREP POV-IOD 4OZ 10% (MISCELLANEOUS) IMPLANT
SOL SCRUB PVP POV-IOD 4OZ 7.5% (MISCELLANEOUS) ×1
SOLUTION SCRB POV-IOD 4OZ 7.5% (MISCELLANEOUS) IMPLANT
STAPLER VISISTAT 35W (STAPLE) IMPLANT
SUT ETHILON 2 0 PSLX (SUTURE) IMPLANT
SUT VIC AB 2-0 CT1 27 (SUTURE) ×2
SUT VIC AB 2-0 CT1 36 (SUTURE) IMPLANT
SUT VIC AB 2-0 CT1 TAPERPNT 27 (SUTURE) IMPLANT
SWAB COLLECTION DEVICE MRSA (MISCELLANEOUS) IMPLANT
SWAB CULTURE ESWAB REG 1ML (MISCELLANEOUS) IMPLANT
TOWEL GREEN STERILE (TOWEL DISPOSABLE) ×1 IMPLANT
WATER STERILE IRR 1000ML POUR (IV SOLUTION) IMPLANT

## 2023-01-26 NOTE — Op Note (Signed)
    NAME: [name]    MRN: [id number] DOB: 1941/12/10    DATE OF OPERATION: 01/26/2023  PREOP DIAGNOSIS:    Nonhealing right groin wound  POSTOP DIAGNOSIS:    Same  PROCEDURE:    Right groin washout, soft tissue debridement Sartorious Muscle flap 200mg  Myriad Morcell placement Vacuum Assisted Closure Device  SURGEON: Broadus John  ASSIST: Dagoberto Ligas, PA  ANESTHESIA: General   EBL: 33ml  INDICATIONS:    Natasha Woodard is a 82 y.o. female  s/p repair of pseudoaneurysm of the right profunda s/p impella placement for acute coronary syndrome. The patient has heart failure, poor nutrition, diabetes.  She has been relatively immobile for the entirety of her hospital stay.  She remains in the ICU after having V-fib arrest last week necessitating defibrillation, and subsequent defibrillator placement earlier this week.  The right groin was initially looking good, however significant drainage was noted which appeared to be fat necrosis as well as lysed blood from a large hematoma that occured at the time of the pseudoaneurysm rupture.  After discussing the risk and benefits of right groin washout, possible muscle, Sherle elected to proceed.  FINDINGS:   Large right groin cavity measuring 13 x 5 x 4cm.   The femoral artery appeared to be well-incorporated but was superficial making me concerned for potential bleeding event if a vacuum assisted dressing was placed. I elected to cover the area with a right Sartorious muscle flap.   TECHNIQUE:   Patient was brought to the OR laid in the supine position.  General anesthesia was induced and the patient was prepped and draped in standard fashion.  The case began with removal of staples and suture and direct visualization into the wound bed.  Significant fat necrosis and lysed hematoma, was present.  There was no purulence.  The fat necrosis was debrided, and cultures taken from the wound bed.  Wound bed measured 13 x 5 x 4  cm. The artery appeared to be well incorporated, but was readily palpable and was superficial in the wound bed. I was concerned that placing a vacuum assisted dressing directly over the site could lead to a bleeding event, therefore I elected to perform a right-sided Sartorious muscle flap.    The proximal portion of the sartorius was controlled circumferentially and mobilized to the anterior superior iliac spine.  The tendon was removed, and sartorius muscle swelling over the right-sided common femoral artery.  This was sutured into place using 2-0 Vicryl tacking sutures.  The muscle contracted with cautery and appeared viable but was abnormal from previous hematoma. Once sutured into place, myriad more cell powder was brought to the field and 2000 mg of the powder were placed in the wound bed.  At this point, I elected to customize a vacuum-assisteddressing to place in the wound bed. This was placed and excellent seal confirmed.   Plan will be for the dressing to remain in place for 1 week. With serial VAC dressings thereafter.  [name] and her family are aware that I am worried about her wound healing appropriately due to her comorbidities and poor nutrition.   [name], MD [facility] DATE OF DICTATION:   01/26/2023

## 2023-01-26 NOTE — Anesthesia Preprocedure Evaluation (Addendum)
Anesthesia Evaluation  Patient identified by MRN, date of birth, ID band Patient awake    Reviewed: Allergy & Precautions, NPO status , Patient's Chart, lab work & pertinent test results  Airway Mallampati: II  TM Distance: >3 FB Neck ROM: Full    Dental  (+) Caps, Dental Advisory Given   Pulmonary sleep apnea , former smoker    + decreased breath sounds      Cardiovascular hypertension, Pt. on home beta blockers and Pt. on medications + CAD, + Past MI, + Peripheral Vascular Disease and +CHF  + dysrhythmias  Rhythm:Regular Rate:Normal  Echo:  1. Akinesis of the apical to mid lateral wall and apex. All other  segments are hypokinetic. Left ventricular ejection fraction, by  estimation, is 20 to 25%. Left ventricular ejection fraction by 2D MOD  biplane is 24.0 %. The left ventricle has severely  decreased function. The left ventricle demonstrates regional wall motion  abnormalities (see scoring diagram/findings for description). The left  ventricular internal cavity size was mildly dilated. Left ventricular  diastolic function could not be  evaluated.   2. Right ventricular systolic function is mildly reduced. The right  ventricular size is mildly enlarged.   3. Left atrial size was mild to moderately dilated.   4. Severe eccentric posteriorly directed mitral regurgitation. Given  lateral WMA, suspect this is ischemic MR (IIIB) due to restricted PMVL.  The mitral valve is grossly normal. Severe mitral valve regurgitation. No  evidence of mitral stenosis.   5. The aortic valve is grossly normal. Aortic valve regurgitation is not  visualized.   6. The inferior vena cava is normal in size with <50% respiratory  variability, suggesting right atrial pressure of 8 mmHg.      Neuro/Psych  Neuromuscular disease  negative psych ROS   GI/Hepatic Neg liver ROS, hiatal hernia,GERD  ,,  Endo/Other  diabetes, Type 2, Oral Hypoglycemic  Agents    Renal/GU Renal disease     Musculoskeletal   Abdominal   Peds  Hematology   Anesthesia Other Findings   Reproductive/Obstetrics                             Anesthesia Physical Anesthesia Plan  ASA: 4  Anesthesia Plan: General   Post-op Pain Management: Tylenol PO (pre-op)*   Induction: Intravenous  PONV Risk Score and Plan: 4 or greater and Ondansetron and Treatment may vary due to age or medical condition  Airway Management Planned: LMA  Additional Equipment: None  Intra-op Plan:   Post-operative Plan: Extubation in OR  Informed Consent: I have reviewed the patients History and Physical, chart, labs and discussed the procedure including the risks, benefits and alternatives for the proposed anesthesia with the patient or authorized representative who has indicated his/her understanding and acceptance.     Dental advisory given  Plan Discussed with: CRNA  Anesthesia Plan Comments:        Anesthesia Quick Evaluation

## 2023-01-26 NOTE — Progress Notes (Signed)
PT Cancellation Note  Patient Details Name: MARJARIE MCFARLAIN MRN: [id number] DOB: 05/22/1941   Cancelled Treatment:    Reason Eval/Treat Not Completed: Patient at procedure or test/unavailable;Patient not medically ready. Pt in OR for rt groin debridement and VAC placement. Will check on tomorrow.    Shary Decamp [facility] 01/26/2023, 8:40 AM [place] Office [phone]

## 2023-01-26 NOTE — Progress Notes (Signed)
Patient ID: Natasha Woodard, female   DOB: Nov 19, 1941, 82 y.o.   MRN: [id number]     Advanced Heart Failure Rounding Note  PCP-Cardiologist: Dr. Ellyn Hack AHF: Dr. 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 Now admitted for acute lateral wall MI 2/2 distal LCx occlusion c/b severe ischemic MR and acute systolic heart failure w/ low output, CI 2.1 on RHC. Required placement of IABP, but later removed given concerns for developing limb ischemia=>found to have Right femoral PSA then ultimate rupture s/p emergent repair. VT arrest 2/8. ICD placed 2/12.   Subjective:   1/31 Underwent thrombin injection to right femoral PSA . Hgb drifting down 10.3>8.3>6.8  2/1 Started on amio drip to suppress PVCs. Got dose of IV lasix x1. CVP low.  2/4 Ruptured PSA. Returned to OR for repair. VAC in place 2/5 Midodrine 5 mg three times daily added.  Back in A fib.  2/7 TEE with mod MR.  2/8 VT arrest--> shock x1 ROSC after 2 minutes. Given lidocaine and amio was increased to 60 mg.  2/9 Transvenous pacemaker placed for overdrive pacing with PMVT.  2/11 Milrinone restarted, Co-ox 49% 2/12 PICC removed. S/p [employer] Sci ICD  2/14 Milrinone stopped . Amio drip stopped.   A- paced 70s.   Complaining of pain all over. Denies SOB   Objective:   Weight Range: 74.8 kg Body mass index is 30.16 kg/m.   Vital Signs:   Temp:  [98.3 F (36.8 C)-98.8 F (37.1 C)] 98.8 F (37.1 C) (02/14 1634) Pulse Rate:  [63-84] 65 (02/15 0600) Resp:  [5-29] 29 (02/15 0600) BP: (101-134)/(50-90) 123/70 (02/15 0600) SpO2:  [91 %-98 %] 95 % (02/15 0600) Weight:  [74.8 kg] 74.8 kg (02/15 0500) Last BM Date : 01/25/23  Weight change: Filed Weights   01/22/23 0500 01/23/23 0500 01/26/23 0500  Weight: 74 kg 72.2 kg 74.8 kg    Intake/Output:   Intake/Output Summary (Last 24 hours) at 01/26/2023 0737 Last data filed at 01/26/2023  0600 Gross per 24 hour  Intake 207.9 ml  Output 1060 ml  Net -852.1 ml     Physical Exam   General:  In bed. No respiratory difficulty HEENT: normal Neck: supple. no JVD. Carotids 2+ bilat; no bruits. No lymphadenopathy or thyromegaly appreciated. Cor: PMI nondisplaced. Regular rate & rhythm. No rubs, gallops or murmurs.  L upper chest dressing intact Lungs: clear Abdomen: soft, nontender, nondistended. No hepatosplenomegaly. No bruits or masses. Good bowel sounds. Extremities: no cyanosis, clubbing, rash, edema. R groin VAC  Neuro: alert & oriented x 3, cranial nerves grossly intact. moves all 4 extremities w/o difficulty. Affect pleasant.   Telemetry    A paced 70s   Labs    CBC Recent Labs    01/25/23 0633 01/26/23 0441  WBC 9.7 9.0  HGB 8.2* 8.4*  HCT 26.5* 26.9*  MCV 95.7 95.4  PLT 475* [id number]   Basic Metabolic Panel Recent Labs    01/25/23 0633 01/26/23 0441  NA 138 139  K 4.1 4.3  CL 101 101  CO2 26 26  GLUCOSE 99 100*  BUN 40* 38*  CREATININE 1.85* 1.80*  CALCIUM 8.8* 8.9  MG 2.3 2.5*   Liver Function Tests No results for input(s): "AST", "ALT", "ALKPHOS", "BILITOT", "PROT", "ALBUMIN" in the last 72 hours.   No results for input(s): "LIPASE", "AMYLASE" in the last 72 hours. Cardiac Enzymes  No results for input(s): "CKTOTAL", "CKMB", "CKMBINDEX", "TROPONINI" in the last 72 hours.  BNP: BNP (last 3 results) Recent Labs    06/17/22 1000 08/31/22 1613 01/10/23 1048  BNP 1,397.5* 1,178.5* >4,500.0*    ProBNP (last 3 results) Recent Labs    06/09/22 1506  PROBNP 4,638*     Imaging    No results found.   Medications:     Scheduled Medications:  acetaminophen  650 mg Oral Q6H   amiodarone  200 mg Oral BID   aspirin  81 mg Oral Daily   Chlorhexidine Gluconate Cloth  6 each Topical Daily   clopidogrel  75 mg Oral Daily   fentaNYL (SUBLIMAZE) injection  50 mcg Intravenous Once   insulin aspart  0-15 Units Subcutaneous TID WC    lidocaine  1 patch Transdermal Q24H   lidocaine  1 patch Transdermal Q24H   rosuvastatin  10 mg Oral QHS   sodium chloride flush  10-40 mL Intracatheter Q12H   sodium chloride flush  3 mL Intravenous Q12H   sodium chloride flush  3 mL Intravenous Q12H    Infusions:  sodium chloride 250 mL (01/23/23 1235)    ceFAZolin (ANCEF) IV Stopped (01/25/23 2135)   vancomycin Stopped (01/25/23 2351)    PRN Medications: sodium chloride, acetaminophen, alum & mag hydroxide-simeth, hydrocortisone cream, ipratropium-albuterol, nitroGLYCERIN, ondansetron (ZOFRAN) IV, mouth rinse, oxyCODONE, sodium chloride flush, sodium chloride flush, traMADol    Assessment/Plan   1. Cardiogenic shock/acute on chronic systolic CHF: Echo this admission with EF 20-25%, mild LV dilation, mild RV dysfunction, severe MR with restricted posterior leaflet.  Echo in 2/23 with EF 50-55%.  Patient has history of nonischemic cardiomyopathy (?PVC-mediated) that improved.  She reports 2 months of dyspnea then had lateral STEMI 1/30 with chest pain. I do not think that PLOM occlusion can explain the extent of her cardiomyopathy, so suspect EF may have been falling prior to this event (symptomatic at least 2 months).  Suspect mixed ischemic/nonischemic cardiomyopathy.  She initially had IABP for cardiogenic shock, now removed due to right leg ischemia. Was weaned off pressors/inotropes but Milrinone restarted 2/11 for low Co-ox and rising SCr. Off milrinone.  -Volume status stable.  - Hold Jardiance for now given bed-bound - additional GDMT initially limited by hypotension and AKI. BP and Scr improving. Tolerating midodrine wean.  - Stable off midodrine.  - S/p  DDD ICD. Appreciate EP  2. Mitral regurgitation: severe MR with restricted posterior leaflet on initial echo, suspect infarct-related MR with PLOM occlusion. TEE was later done showing moderate MR.  3. CAD: Lateral STEMI with occluded pLOM, treated with DES.   - Continue ASA  81 - Switched from Brilinta to plavix.  - Continue statin.  4. 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  - Continue amio 200 mg twice a day.   5. Right femoral PSA: s/p thrombin injection by Dr. Carlis Abbott. 2/4 stat u/s with recurrent PSA with active bleeding into PSA. S/p PSA repair with VAC placement.  - Plan return to OR today for washout.  - On Ancef for perioperative coverage. Plan dose of Vanc prior to OR  6. AKI on CKD stage 3b: Baseline 1.3-1.5.  Creatinine trended up to 2.06. Milrinone restarted for low output. SCr plateued 1.8 .  7. . Acute blood loss anemia:  Hgb9  - monitor closely  8. Atrial fibrillation: Paroxysmal.  - Per EP hold a/c x 1 wk post ICD placement. No heparin  - Amiodarone  200 mg bid  9. VT Arrest: 2/7, DCCV x 1 with CPR, ROSC after 2 minutes.  Polymorphic VT, appeared bradycardia-mediated with intermittent junctional rhythm. - s/p DDD ICD on 2/12 - Continue amiodarone.   OOB when ok with vascular.   Darrick Grinder, NP-C  01/26/2023

## 2023-01-26 NOTE — Transfer of Care (Signed)
Immediate Anesthesia Transfer of Care Note  Patient: Natasha Woodard  Procedure(s) Performed: APPLICATION OF WOUND VAC RIGHT GROIN WASHOUT AND SARTORIOUS MUSCLE FLAP (Right: Groin)  Patient Location: PACU  Anesthesia Type:General  Level of Consciousness: drowsy and patient cooperative  Airway & Oxygen Therapy: Patient Spontanous Breathing and Patient connected to nasal cannula oxygen  Post-op Assessment: Report given to RN and Post -op Vital signs reviewed and stable  Post vital signs: Reviewed and stable  Last Vitals:  Vitals Value Taken Time  BP 114/73 84 01/26/23 1158  Temp    Pulse 70 01/26/23 1158  Resp 14 01/26/23 1158  SpO2 95 01/26/23 1158    Last Pain:  Vitals:   01/26/23 0842  TempSrc: Oral  PainSc:       Patients Stated Pain Goal: 2 ([id number])  Complications: No notable events documented.

## 2023-01-26 NOTE — Anesthesia Procedure Notes (Signed)
Procedure Name: Intubation Date/Time: 01/26/2023 10:48 AM  Performed by: Colin Benton, CRNAPre-anesthesia Checklist: Patient identified, Emergency Drugs available, Suction available and Patient being monitored Patient Re-evaluated:Patient Re-evaluated prior to induction Oxygen Delivery Method: Circle system utilized Preoxygenation: Pre-oxygenation with 100% oxygen Induction Type: IV induction Ventilation: Mask ventilation without difficulty Laryngoscope Size: Miller and 2 Grade View: Grade II Tube type: Oral Tube size: 7.0 mm Number of attempts: 1 Airway Equipment and Method: Stylet Placement Confirmation: ETT inserted through vocal cords under direct vision, positive ETCO2 and breath sounds checked- equal and bilateral Secured at: 22 cm Tube secured with: Tape Dental Injury: Teeth and Oropharynx as per pre-operative assessment

## 2023-01-26 NOTE — Anesthesia Postprocedure Evaluation (Signed)
Anesthesia Post Note  Patient: Natasha Woodard  Procedure(s) Performed: APPLICATION OF WOUND VAC RIGHT GROIN WASHOUT AND SARTORIUS MUSCLE FLAP (Right: Groin)     Patient location during evaluation: PACU Anesthesia Type: General Level of consciousness: awake and alert Pain management: pain level controlled Vital Signs Assessment: post-procedure vital signs reviewed and stable Respiratory status: spontaneous breathing, nonlabored ventilation, respiratory function stable and patient connected to nasal cannula oxygen Cardiovascular status: blood pressure returned to baseline and stable Postop Assessment: no apparent nausea or vomiting Anesthetic complications: no  No notable events documented.  Last Vitals:  Vitals:   01/26/23 1500 01/26/23 1605  BP: 125/72   Pulse: 76   Resp: 20   Temp:  37.2 C  SpO2: 99%     Last Pain:  Vitals:   01/26/23 1605  TempSrc: Oral  PainSc:                  Effie Berkshire

## 2023-01-26 NOTE — Progress Notes (Signed)
  Progress Note    01/26/2023 9:55 AM Day of Surgery  Subjective:  no complaints   Vitals:   01/26/23 0820 01/26/23 0842  BP:  124/70  Pulse:  75  Resp:  (!) 29  Temp: 98.3 F (36.8 C) 98.7 F (37.1 C)  SpO2:  96%   Physical Exam: Lungs:  non labored Incisions:  R groin with wound vac in place, good seal Extremities:  feet warm Abdomen:  soft Neurologic: A&O  CBC    Component Value Date/Time   WBC 9.0 01/26/2023 0441   RBC 2.82 (L) 01/26/2023 0441   HGB 8.4 (L) 01/26/2023 0441   HGB 14.2 10/09/2020 0935   HCT 26.9 (L) 01/26/2023 0441   HCT 44.9 10/09/2020 0935   PLT 393 01/26/2023 0441   PLT 379 10/09/2020 0935   MCV 95.4 01/26/2023 0441   MCV 91 10/09/2020 0935   MCH 29.8 01/26/2023 0441   MCHC 31.2 01/26/2023 0441   RDW 17.6 (H) 01/26/2023 0441   RDW 13.4 10/09/2020 0935   LYMPHSABS 0.9 01/15/2023 0950   LYMPHSABS 1.1 10/09/2020 0935   MONOABS 1.4 (H) 01/15/2023 0950   EOSABS 0.6 (H) 01/15/2023 0950   EOSABS 0.2 10/09/2020 0935   BASOSABS 0.1 01/15/2023 0950   BASOSABS 0.1 10/09/2020 0935    BMET    Component Value Date/Time   NA 139 01/26/2023 0441   NA 145 (H) 07/07/2022 1007   K 4.3 01/26/2023 0441   CL 101 01/26/2023 0441   CO2 26 01/26/2023 0441   GLUCOSE 100 (H) 01/26/2023 0441   BUN 38 (H) 01/26/2023 0441   BUN 24 07/07/2022 1007   CREATININE 1.80 (H) 01/26/2023 0441   CREATININE 1.28 (H) 01/14/2022 1551   CALCIUM 8.9 01/26/2023 0441   GFRNONAA 28 (L) 01/26/2023 0441   GFRAA 49 (L) 10/09/2020 0933    INR    Component Value Date/Time   INR 1.4 (H) 01/10/2023 2145     Intake/Output Summary (Last 24 hours) at 01/26/2023 0955 Last data filed at 01/26/2023 0800 Gross per 24 hour  Intake 207.9 ml  Output 995 ml  Net -787.1 ml      Assessment/Plan:  82 y.o. female is s/p repair of pseudoaneurysm R groin Day of Surgery   Staples removed and wound vac place in R groin Monday due to incision breakdown due to underlying tissue  necrosis.  Vac has a good seal this morning.  Minimal serosanguinous collection in cannister.  Plan is for return to the OR today to washout the wound and place VAC for healing via secondary intention. Pt aware she may require a muscle flap for coverage.    Dagoberto Ligas, PA-C [facility] [phone] 01/26/2023 9:55 AM

## 2023-01-27 ENCOUNTER — Encounter (HOSPITAL_COMMUNITY): Payer: Self-pay | Admitting: Vascular Surgery

## 2023-01-27 DIAGNOSIS — I214 Non-ST elevation (NSTEMI) myocardial infarction: Secondary | ICD-10-CM | POA: Diagnosis not present

## 2023-01-27 LAB — BASIC METABOLIC PANEL
Anion gap: 11 (ref 5–15)
BUN: 44 mg/dL — ABNORMAL HIGH (ref 8–23)
CO2: 24 mmol/L (ref 22–32)
Calcium: 9 mg/dL (ref 8.9–10.3)
Chloride: 102 mmol/L (ref 98–111)
Creatinine, Ser: 1.89 mg/dL — ABNORMAL HIGH (ref 0.44–1.00)
GFR, Estimated: 26 mL/min — ABNORMAL LOW (ref >60)
Glucose, Bld: 160 mg/dL — ABNORMAL HIGH (ref 70–99)
Potassium: 4.8 mmol/L (ref 3.5–5.1)
Sodium: 137 mmol/L (ref 135–145)

## 2023-01-27 LAB — GLUCOSE, CAPILLARY
Glucose-Capillary: 122 mg/dL — ABNORMAL HIGH (ref 70–99)
Glucose-Capillary: 121 mg/dL — ABNORMAL HIGH (ref 70–99)
Glucose-Capillary: 195 mg/dL — ABNORMAL HIGH (ref 70–99)
Glucose-Capillary: 141 mg/dL — ABNORMAL HIGH (ref 70–99)
Glucose-Capillary: 186 mg/dL — ABNORMAL HIGH (ref 70–99)

## 2023-01-27 LAB — C-REACTIVE PROTEIN: CRP: 7.8 mg/dL — ABNORMAL HIGH (ref <1.0)

## 2023-01-27 LAB — MAGNESIUM: Magnesium: 2.3 mg/dL (ref 1.7–2.4)

## 2023-01-27 MED ORDER — FUROSEMIDE 40 MG PO TABS
40.0000 mg | ORAL_TABLET | Freq: Every day | ORAL | Status: DC
  Administered 2023-01-27 – 2023-01-29 (×3): 40 mg via ORAL
  Filled 2023-01-27 (×3): qty 1

## 2023-01-27 MED ORDER — ENSURE ENLIVE PO LIQD
237.0000 mL | Freq: Three times a day (TID) | ORAL | Status: DC
  Administered 2023-01-27 – 2023-02-19 (×58): 237 mL via ORAL

## 2023-01-27 NOTE — Progress Notes (Signed)
   Came by to remove pressure dressing, which had already been removed.   ICD site appears stable with mild, global edema but no frank hematoma.   Can leave outer square bandage on as long as in ICU or take off prior to discharge.   Reviewed arm restrictions and continued need to keep steri-strips underneath intact, clean, and dry.   Legrand Como [address] [place], PA-C  01/27/2023 2:12 PM

## 2023-01-27 NOTE — Progress Notes (Addendum)
Patient ID: Natasha Woodard, female   DOB: 09-14-1941, 82 y.o.   MRN: [id number]     Advanced Heart Failure Rounding Note  PCP-Cardiologist: Dr. Ellyn Hack AHF: Dr. 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 Now admitted for acute lateral wall MI 2/2 distal LCx occlusion c/b severe ischemic MR and acute systolic heart failure w/ low output, CI 2.1 on RHC. Required placement of IABP, but later removed given concerns for developing limb ischemia=>found to have Right femoral PSA then ultimate rupture s/p emergent repair. VT arrest 2/8. ICD placed 2/12.   Subjective:   1/31 Underwent thrombin injection to right femoral PSA . Hgb drifting down 10.3>8.3>6.8  2/1 Started on amio drip to suppress PVCs. Got dose of IV lasix x1. CVP low.  2/4 Ruptured PSA. Returned to OR for repair. VAC in place 2/5 Midodrine 5 mg three times daily added.  Back in A fib.  2/7 TEE with mod MR.  2/8 VT arrest--> shock x1 ROSC after 2 minutes. Given lidocaine and amio was increased to 60 mg.  2/9 Transvenous pacemaker placed for overdrive pacing with PMVT.  2/11 Milrinone restarted, Co-ox 49% 2/12 PICC removed. S/p [employer] ICD  2/14 Milrinone stopped . Amio drip stopped.  2/15 Returned to OR for washout and VAC change with sartorious muscle flap.   A paced 70s.   Denies SOB.    Objective:   Weight Range: 76.5 kg Body mass index is 30.85 kg/m.   Vital Signs:   Temp:  [97.9 F (36.6 C)-99.7 F (37.6 C)] 98.7 F (37.1 C) (02/16 0700) Pulse Rate:  [70-78] 70 (02/16 0500) Resp:  [12-31] 31 (02/16 0500) BP: (101-134)/(56-88) 122/62 (02/16 0400) SpO2:  [85 %-100 %] 89 % (02/16 0500) Weight:  [76.5 kg] 76.5 kg (02/16 0500) Last BM Date : 01/26/23  Weight change: Filed Weights   01/23/23 0500 01/26/23 0500 01/27/23 0500  Weight: 72.2 kg 74.8 kg 76.5 kg    Intake/Output:   Intake/Output Summary (Last 24 hours)  at 01/27/2023 0735 Last data filed at 01/27/2023 0600 Gross per 24 hour  Intake 840 ml  Output 1068 ml  Net -228 ml     Physical Exam  General:   No resp difficulty HEENT: normal Neck: supple. no JVD. Carotids 2+ bilat; no bruits. No lymphadenopathy or thryomegaly appreciated. Cor: PMI nondisplaced. Regular rate & rhythm. No rubs, gallops or murmurs. L upper chest dressing.  Lungs: clear Abdomen: soft, nontender, nondistended. No hepatosplenomegaly. No bruits or masses. Good bowel sounds. Extremities: no cyanosis, clubbing, rash, edema. R groin VAC  Neuro: alert & orientedx3, cranial nerves grossly intact. moves all 4 extremities w/o difficulty. Affect pleasant   Telemetry    A paced 70s   Labs    CBC Recent Labs    01/25/23 0633 01/26/23 0441  WBC 9.7 9.0  HGB 8.2* 8.4*  HCT 26.5* 26.9*  MCV 95.7 95.4  PLT 475* [id number]   Basic Metabolic Panel Recent Labs    01/26/23 0441 01/27/23 0244  NA 139 137  K 4.3 4.8  CL 101 102  CO2 26 24  GLUCOSE 100* 160*  BUN 38* 44*  CREATININE 1.80* 1.89*  CALCIUM 8.9 9.0  MG 2.5* 2.3   Liver Function Tests No results for input(s): "AST", "ALT", "ALKPHOS", "BILITOT", "PROT", "ALBUMIN" in the last 72 hours.   No results for input(s): "LIPASE", "AMYLASE" in the  last 72 hours. Cardiac Enzymes No results for input(s): "CKTOTAL", "CKMB", "CKMBINDEX", "TROPONINI" in the last 72 hours.  BNP: BNP (last 3 results) Recent Labs    06/17/22 1000 08/31/22 1613 01/10/23 1048  BNP 1,397.5* 1,178.5* >4,500.0*    ProBNP (last 3 results) Recent Labs    06/09/22 1506  PROBNP 4,638*     Imaging    No results found.   Medications:     Scheduled Medications:  acetaminophen  650 mg Oral Q6H   amiodarone  200 mg Oral BID   aspirin  81 mg Oral Daily   Chlorhexidine Gluconate Cloth  6 each Topical Daily   clopidogrel  75 mg Oral Daily   fentaNYL (SUBLIMAZE) injection  50 mcg Intravenous Once   insulin aspart  0-15 Units  Subcutaneous TID WC   lidocaine  1 patch Transdermal Q24H   lidocaine  1 patch Transdermal Q24H   rosuvastatin  10 mg Oral QHS   sodium chloride flush  10-40 mL Intracatheter Q12H   sodium chloride flush  3 mL Intravenous Q12H   sodium chloride flush  3 mL Intravenous Q12H    Infusions:  sodium chloride 250 mL (01/23/23 1235)    ceFAZolin (ANCEF) IV Stopped (01/26/23 2202)    PRN Medications: sodium chloride, acetaminophen, alum & mag hydroxide-simeth, hydrocortisone cream, ipratropium-albuterol, nitroGLYCERIN, ondansetron (ZOFRAN) IV, mouth rinse, oxyCODONE, simethicone, sodium chloride flush, sodium chloride flush, traMADol    Assessment/Plan   1. Cardiogenic shock/acute on chronic systolic CHF: Echo this admission with EF 20-25%, mild LV dilation, mild RV dysfunction, severe MR with restricted posterior leaflet.  Echo in 2/23 with EF 50-55%.  Patient has history of nonischemic cardiomyopathy (?PVC-mediated) that improved.  She reports 2 months of dyspnea then had lateral STEMI 1/30 with chest pain.  PLOM occlusion not thought to explain the extent of her cardiomyopathy, so suspect EF may have been falling prior to this event (symptomatic at least 2 months).  Suspect mixed ischemic/nonischemic cardiomyopathy.  She initially had IABP for cardiogenic shock, now removed due to right leg ischemia. Was weaned off pressors/inotropes but Milrinone restarted 2/11 for low Co-ox and rising SCr. Off milrinone.  -Volume status mildly elevated. Add lasix 40 mg daily.  - Hold Jardiance for now given bed-bound - additional GDMT initially limited by hypotension and AKI. BP and Scr improving. Tolerating midodrine wean.  - Stable off midodrine.  - S/p  DDD ICD. Appreciate EP  2. Mitral regurgitation: severe MR with restricted posterior leaflet on initial echo, suspect infarct-related MR with PLOM occlusion. TEE was later done showing moderate MR.  3. CAD: Lateral STEMI with occluded pLOM, treated with  DES.   - Continue ASA 81 - Switched from Brilinta to plavix.  - Continue statin.  4. 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  - Continue amio 200 mg twice a day.   5. Right femoral PSA: s/p thrombin injection by Dr. Carlis Abbott. 2/4 stat u/s with recurrent PSA with active bleeding into PSA. S/p PSA repair with VAC placement.  2/15 VAC change with sartorious muscle flap.  6. AKI on CKD stage 3b: Baseline 1.3-1.5.  Creatinine trended up to 2.06. Milrinone restarted for low output. SCr plateued 1.9  7. . Acute blood loss anemia:  - Check CBC - monitor closely  8. Atrial fibrillation: Paroxysmal.  - Per EP hold a/c x 1 wk post ICD placement. No heparin  - Remains in SR. - Amiodarone 200 mg bid  9. VT Arrest:  2/7, DCCV x 1 with CPR, ROSC after 2 minutes.  Polymorphic VT, appeared bradycardia-mediated with intermittent junctional rhythm. - s/p DDD ICD on 2/12 - Continue amiodarone.  Plan to transfer to progressive and the hospital st to pick up.      Darrick Grinder, NP-C  01/27/2023

## 2023-01-27 NOTE — TOC CM/SW Note (Signed)
Placed [employer] wound vac order on chart for outpatient wound vac. Will need attending to sign.  [place], Heart Failure TOC CM [phone]

## 2023-01-27 NOTE — Progress Notes (Signed)
Initial Nutrition Assessment  DOCUMENTATION CODES:   Non-severe (moderate) malnutrition in context of acute illness/injury  INTERVENTION:   Liberalized diet to REGULAR; ok for family to bring in outside food for pt.   Ensure Enlive po TID, each supplement provides 350 kcal and 20 grams of protein.  Check pertinent micronutrient labs needed for wound healing including Vitamin C, Vitamin A, zinc in addition to CRP  Consider addition of Juven BID on follow-up to promote po intake, each packet provides 80 calories, 8 grams of carbohydrate, 2.5  grams of protein (collagen), 7 grams of L-arginine and 7 grams of L-glutamine; supplement contains CaHMB, Vitamins C, E, B12 and Zinc  RD maximizing nutrition interventions to assist with wound healing. 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  GOAL:   Patient will meet greater than or equal to 90% of their needs  MONITOR:   PO intake, Supplement acceptance, Labs, Weight trends  REASON FOR ASSESSMENT:   Consult Assessment of nutrition requirement/status, Wound healing  ASSESSMENT:   82 yo female admitted with NSTEMI requiring IABP, pt with bleed and impaired circulation with IABP removed and thrombin injection to pseudoaneurysm with subsequent rupture requiring repair and wound VAC placement. PMH includes NICM, CAD, DM, HLD, hiatal hermia with refux, Ichthyosis congenita, CKD  1/30 Admitted, IABP 1/31 Thrombin injection to right femoral PSA 2/04 Ruptured PSA-OR for repair 2/08 VT arrest 2/12 ICD placed 2/16 R groin washout with sartorius muscle flap and placement of wound VAC  Pt reports poor appetite with poor intake since admission, pt did not like the hospital food. Pt reports the food here has no taste. Family is bringing in  food at times. Pt eating 0-100% of meals with po intake improving recently. Pt does like Ensure shakes; plan to order TID between meals.   Pt denies any issues chewing/swallowing; no issues with N/V/D  Pt with rare skin condition, Ichthyosis congenita, since birth. Skin is dry and scaly from head to toe. Unsure to what degree this may be impacting wound healing.   Pt reports basically bed bound this admission due to site of wound; noted PT/OT are following but seen and reassessed today for the first time since 9 days due to multiple medical issues.Suspect some degree of LE wasting related to immobility. Noted CIR recommended  Labs: CBGs 93-195, BUN 44, Creatinine 1.89 Meds: lasix,ss novolog  NUTRITION - FOCUSED PHYSICAL EXAM:  Flowsheet Row Most Recent Value  Orbital Region Mild depletion  Upper Arm Region No depletion  Thoracic and Lumbar Region No depletion  Buccal Region Mild depletion  Temple Region Moderate depletion  Clavicle Bone Region Moderate depletion  Clavicle and Acromion Bone Region Moderate depletion  Scapular Bone Region Moderate depletion  Dorsal Hand Mild depletion  Patellar Region Moderate depletion  Anterior Thigh Region Moderate depletion  Posterior Calf Region Severe depletion  Edema (RD Assessment) Mild       Diet Order:   Diet Order             Diet regular Room service appropriate? Yes; Fluid consistency: Thin  Diet effective now                   EDUCATION NEEDS:   Not appropriate for education at this time  Skin:  Skin Assessment: Skin Integrity Issues: Skin Integrity Issues::  Wound VAC, Other (Comment) Wound Vac: groin Other: Ichthyosis congenita since birth  Last BM:  2/16  Height:   Ht Readings from Last 1 Encounters:  01/12/23 5' 2"$  (1.575 m)    Weight:   Wt Readings from Last 1 Encounters:  01/27/23 76.5 kg     BMI:  Body mass index is 30.85 kg/m.  Estimated Nutritional Needs:   Kcal:  1600-1800 kcals  Protein:   75-90 g  Fluid:  >/= 1.6 L    Kerman Passey MS, RDN, LDN, CNSC Registered Dietitian 3 Clinical Nutrition RD Pager and On-Call Pager Number Located in [place]

## 2023-01-27 NOTE — Discharge Instructions (Addendum)
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 After 7 days, you may progress as below.  You should remove your sling 24 hours after your procedure, unless otherwise instructed by your provider.     Friday February 03, 2023  Saturday February 04, 2023 Sunday February 05, 2023 Monday February 06, 2023   Do not lift, push, pull, or carry anything over 10 pounds with the affected arm until 6 weeks (Friday March 10, 2023 ) after your procedure.   You may drive AFTER your wound check, unless you have been told otherwise by your provider.   Ask your healthcare provider when you can go back to work   INCISION/Dressing If you are on a blood thinner such as Coumadin, Xarelto, Eliquis, Plavix, or Pradaxa please confirm with your provider when this should be resumed.   If large square, outer bandage is left in place, this can be removed after 24 hours from your procedure. Do not remove steri-strips or glue as below.   Monitor your defibrillator site for redness, swelling, and drainage. Call the device clinic at [phone] if you experience these symptoms or fever/chills.  If your incision is sealed with Steri-strips or staples, you may shower 7 days after your procedure or when told by your provider. Do not remove the steri-strips or let the shower hit directly on your site. You may wash around your site with soap and water.    If you were discharged in a sling, please do not wear this during the day more than 48 hours after your surgery unless otherwise instructed. This may increase the risk of stiffness and soreness in your shoulder.   Avoid lotions, ointments, or perfumes over your incision until it is well-healed.  You may use a hot tub or a pool AFTER your wound check appointment if the incision is completely closed.  Your ICD is designed to protect you from life  threatening heart rhythms. Because of this, you may receive a shock.   1 shock with no symptoms:  Call the office during business hours. 1 shock with symptoms (chest pain, chest pressure, dizziness, lightheadedness, shortness of breath, overall feeling unwell):  Call 911. If you experience 2 or more shocks in 24 hours:  Call 911. If you receive a shock, you should not drive for 6 months per the [place] DMV IF you receive appropriate therapy from your ICD.   ICD Alerts:  Some alerts are vibratory and others beep. These are NOT emergencies. Please call our office to let us know. If this occurs at night or on weekends, it can wait until the next business day. Send a remote transmission.  If your device is capable of reading fluid status (for heart failure), you will be offered monthly monitoring to review this with you.   DEVICE MANAGEMENT Remote monitoring is used to monitor your ICD from home. This monitoring is scheduled every 91 days by our office. It allows [place] to keep an eye on the functioning of your device to ensure it is working properly. You will routinely see your Electrophysiologist annually (more often if necessary).   You should receive your ID card for your new device in 4-8 weeks. Keep this card with you at all times once received. Consider wearing a medical alert bracelet or necklace.  Your ICD  may be MRI compatible. This will be discussed at your  next office visit/wound check.  You should avoid contact with strong electric or magnetic fields.   Do not use amateur (ham) radio equipment or electric (arc) welding torches. MP3 player headphones with magnets should not be used. Some devices are safe to use if held at least 12 inches (30 cm) from your defibrillator. These include power tools, lawn mowers, and speakers. If you are unsure if something is safe to use, ask your health care provider.  When using your cell phone, hold it to the ear that is on the opposite side from the  defibrillator. Do not leave your cell phone in a pocket over the defibrillator.  You may safely use electric blankets, heating pads, computers, and microwave ovens.  Call the office right away if: You have chest pain. You feel more than one shock. You feel more short of breath than you have felt before. You feel more light-headed than you have felt before. Your incision starts to open up.  This information is not intended to replace advice given to you by your health care provider. Make sure you discuss any questions you have with your health care provider.    Information on my medicine - ELIQUIS (apixaban)  Why was Eliquis prescribed for you? Eliquis was prescribed for you to reduce the risk of a blood clot forming that can cause a stroke if you have a medical condition called atrial fibrillation (a type of irregular heartbeat).  What do You need to know about Eliquis ? Take your Eliquis TWICE DAILY - one tablet in the morning and one tablet in the evening with or without food. If you have difficulty swallowing the tablet whole please discuss with your pharmacist how to take the medication safely.  Take Eliquis exactly as prescribed by your doctor and DO NOT stop taking Eliquis without talking to the doctor who prescribed the medication.  Stopping may increase your risk of developing a stroke.  Refill your prescription before you run out.  After discharge, you should have regular check-up appointments with your healthcare provider that is prescribing your Eliquis.  In the future your dose may need to be changed if your kidney function or weight changes by a significant amount or as you get older.  What do you do if you miss a dose? If you miss a dose, take it as soon as you remember on the same day and resume taking twice daily.  Do not take more than one dose of ELIQUIS at the same time to make up a missed dose.  Important Safety Information A possible side effect of Eliquis  is bleeding. You should call your healthcare provider right away if you experience any of the following: Bleeding from an injury or your nose that does not stop. Unusual colored urine (red or dark brown) or unusual colored stools (red or black). Unusual bruising for unknown reasons. A serious fall or if you hit your head (even if there is no bleeding).  Some medicines may interact with Eliquis and might increase your risk of bleeding or clotting while on Eliquis. To help avoid this, consult your healthcare provider or pharmacist prior to using any new prescription or non-prescription medications, including herbals, vitamins, non-steroidal anti-inflammatory drugs (NSAIDs) and supplements.  This website has more information on Eliquis (apixaban): http://www.eliquis.com/eliquis/home   Information about your medication: Plavix (anti-platelet agent)  Generic Name (Brand): clopidogrel (Plavix), once daily medication  PURPOSE: You are taking this medication along with aspirin to lower your chance of having a  heart attack, stroke, or blood clots in your heart stent. These can be fatal. Plavix and aspirin help prevent platelets from sticking together and forming a clot that can block an artery or your stent.   Common SIDE EFFECTS you may experience include: bruising or bleeding more easily, shortness of breath  Do not stop taking PLAVIX without talking to the doctor who prescribes it for you. People who are treated with a stent and stop taking Plavix too soon, have a higher risk of getting a blood clot in the stent, having a heart attack, or dying. If you stop Plavix because of bleeding, or for other reasons, your risk of a heart attack or stroke may increase.   Avoid taking NSAID agents or anti-inflammatory medications such as ibuprofen, naproxen given increased bleed risk with plavix - can use acetaminophen (Tylenol) if needed for pain.  Avoid taking over the counter stomach medications omeprazole  (Prilosec) or esomeprazole (Nexium) since these do interact and make plavix less effective - ask your pharmacist or doctor for alterative agents if needed for heartburn or GERD.   Tell all of your doctors and dentists that you are taking Plavix. They should talk to the doctor who prescribed Plavix for you before you have any surgery or invasive procedure.   Contact your health care provider if you experience: severe or uncontrollable bleeding, pink/red/brown urine, vomiting blood or vomit that looks like "coffee grounds", red or black stools (looks like tar), coughing up blood or blood clots ----------------------------------------------------------------------------------------------------------------------

## 2023-01-27 NOTE — Progress Notes (Addendum)
  Progress Note    01/27/2023 7:14 AM 1 Day Post-Op  Subjective:  no complaints this morning   Vitals:   01/27/23 0500 01/27/23 0700  BP:    Pulse: 70   Resp: (!) 31   Temp: 97.9 F (36.6 C) 98.7 F (37.1 C)  SpO2: (!) 89%    Physical Exam: Lungs:  non labored Incisions:  R groin with vac in place and good seal Extremities:  R foot well perfused with motor and sensation intact Abdomen:  soft, NT, ND Neurologic: A&O  CBC    Component Value Date/Time   WBC 9.0 01/26/2023 0441   RBC 2.82 (L) 01/26/2023 0441   HGB 8.4 (L) 01/26/2023 0441   HGB 14.2 10/09/2020 0935   HCT 26.9 (L) 01/26/2023 0441   HCT 44.9 10/09/2020 0935   PLT 393 01/26/2023 0441   PLT 379 10/09/2020 0935   MCV 95.4 01/26/2023 0441   MCV 91 10/09/2020 0935   MCH 29.8 01/26/2023 0441   MCHC 31.2 01/26/2023 0441   RDW 17.6 (H) 01/26/2023 0441   RDW 13.4 10/09/2020 0935   LYMPHSABS 0.9 01/15/2023 0950   LYMPHSABS 1.1 10/09/2020 0935   MONOABS 1.4 (H) 01/15/2023 0950   EOSABS 0.6 (H) 01/15/2023 0950   EOSABS 0.2 10/09/2020 0935   BASOSABS 0.1 01/15/2023 0950   BASOSABS 0.1 10/09/2020 0935    BMET    Component Value Date/Time   NA 137 01/27/2023 0244   NA 145 (H) 07/07/2022 1007   K 4.8 01/27/2023 0244   CL 102 01/27/2023 0244   CO2 24 01/27/2023 0244   GLUCOSE 160 (H) 01/27/2023 0244   BUN 44 (H) 01/27/2023 0244   BUN 24 07/07/2022 1007   CREATININE 1.89 (H) 01/27/2023 0244   CREATININE 1.28 (H) 01/14/2022 1551   CALCIUM 9.0 01/27/2023 0244   GFRNONAA 26 (L) 01/27/2023 0244   GFRAA 49 (L) 10/09/2020 0933    INR    Component Value Date/Time   INR 1.4 (H) 01/10/2023 2145     Intake/Output Summary (Last 24 hours) at 01/27/2023 [id number] Last data filed at 01/27/2023 0600 Gross per 24 hour  Intake 840 ml  Output 1068 ml  Net -228 ml     Assessment/Plan:  82 y.o. female is s/p R groin washout, sartorius muscle flap and placement of vac 1 Day Post-Op   BLE warm and well  perfused Continue vac R groin for 1 week; good seal this morning Encouraged nutrition and participation with therapy teams All questions answered regarding OR findings and wound care   Dagoberto Ligas, PA-C [facility] [phone] 01/27/2023 7:14 AM  VASCULAR STAFF ADDENDUM: I have independently interviewed and examined the patient. I agree with the above.  Discussed that her poor healing is a product of poor nutrition and comorbidities. She is aware healing by secondary intention will be a long road.   Cassandria Santee, MD [facility] Phone Number: [phone] 01/27/2023 5:07 PM

## 2023-01-27 NOTE — Progress Notes (Signed)
  Inpatient Rehabilitation Admissions Coordinator   Met with patient and son, Randall Hiss, at bedside for rehab assessment. Patient slept throughout my visit. We discussed goals and expectations of a possible CIR admit. Prior to her admission patient was very active and gong to the [facility], driving without AD. I await further progress and demonstration of tolerance for increased activities to assist in determining most appropriate rehab venue options. We will follow up next week. Please call me with any questions.   Danne Baxter, RN, MSN Rehab Admissions Coordinator [phone]

## 2023-01-27 NOTE — Progress Notes (Addendum)
Pt given MI and heart failure booklet. Pt nodded to reception but was too sleepy to engage. Currently N/A for CRP II but will place referral for [place] per protocol. Will sign off until pt is ambulating.   [place]han, BSRT

## 2023-01-27 NOTE — Progress Notes (Signed)
Physical Therapy Treatment/Re-eval Patient Details Name: Natasha Woodard MRN: [id number] DOB: 1941/07/16 Today's Date: 01/27/2023   History of Present Illness 82 yo female admitted 1/30 with chest pain, NSTEMI s/p cardiac cath and IABP placed. Pt with bleeding and impaired circulation with IABP removed 1/31 and thrombin injection to pseudoaneurysm. 2/4 Rt groin pseudoaneurysm rupture s/p repair with placement of wound VAC. Pt with VT arrest on 2/8. ICD placed 2/12. Pt with rt groin washout and debridement and sartorius muscle flap with VAC placement on 2/15. PMhx: CAD, dilated cardiomyopathy, CKD, GERD, HFpEF, HTN, HLD, T2DM    PT Comments    Pt seen and reassessed after 9 days since last therapy due to multiple medical issues. Pt with expected decr in mobility as expected since last seen. Was able to tolerate OOB to [facility] and back and remains motivated to return to independence. Goals updated and continue to recommend AIR for further rehab.   Recommendations for follow up therapy are one component of a multi-disciplinary discharge planning process, led by the attending physician.  Recommendations may be updated based on patient status, additional functional criteria and insurance authorization.  Follow Up Recommendations  Acute inpatient rehab (3hours/day)     Assistance Recommended at Discharge Frequent or constant Supervision/Assistance  Patient can return home with the following A little help with walking and/or transfers;A little help with bathing/dressing/bathroom;Help with stairs or ramp for entrance;Assist for transportation;Assistance with cooking/housework   Equipment Recommendations  Rolling walker (2 wheels)    Recommendations for Other Services       Precautions / Restrictions Precautions Precautions: Fall;ICD/Pacemaker;Other (comment) Precaution Comments: VAC rt groin Restrictions Weight Bearing Restrictions: No     Mobility  Bed Mobility Overal bed mobility: Needs  Assistance Bed Mobility: Supine to Sit, Sit to Sidelying     Supine to sit: Mod assist, +2 for physical assistance, HOB elevated   Sit to sidelying: +2 for physical assistance, Mod assist General bed mobility comments: Assist to bring legs off of bed, elevate trunk into sitting and bring hips to EOB. Assist to lower trunk and bring legs up into bed.    Transfers Overall transfer level: Needs assistance Equipment used: Rolling walker (2 wheels) Transfers: Sit to/from Stand, Bed to chair/wheelchair/BSC Sit to Stand: Mod assist, +2 physical assistance   Step pivot transfers: +2 physical assistance, Min assist       General transfer comment: Assist to bring hips up and for balance. Small pivotal steps with walker with +2 min assist for balance and support.    Ambulation/Gait             Pre-gait activities: Side stepped up side of bed with walker with +2 min assist     Stairs             Wheelchair Mobility    Modified Rankin (Stroke Patients Only)       Balance Overall balance assessment: Needs assistance Sitting-balance support: Bilateral upper extremity supported, Feet supported Sitting balance-Leahy Scale: Poor Sitting balance - Comments: UE support   Standing balance support: Bilateral upper extremity supported, During functional activity, Reliant on assistive device for balance Standing balance-Leahy Scale: Poor Standing balance comment: walker and min assist for static standing                            Cognition Arousal/Alertness: Awake/alert Behavior During Therapy: WFL for tasks assessed/performed Overall Cognitive Status: Impaired/Different from baseline Area of Impairment: Problem solving, Awareness  Awareness: Emergent Problem Solving: Slow processing, Requires verbal cues          Exercises      General Comments General comments (skin integrity, edema, etc.): VSS on RA       Pertinent Vitals/Pain Pain Assessment Pain Assessment: Faces Faces Pain Scale: Hurts little more Pain Location: rt groin Pain Descriptors / Indicators: Sore, Grimacing Pain Intervention(s): Limited activity within patient's tolerance, Monitored during session, Repositioned    Home Living                          Prior Function            PT Goals (current goals can now be found in the care plan section) Acute Rehab PT Goals Patient Stated Goal: return home PT Goal Formulation: With patient Time For Goal Achievement: 02/10/23 Potential to Achieve Goals: Fair Progress towards PT goals: Goals downgraded-see care plan    Frequency    Min 3X/week      PT Plan Current plan remains appropriate    Co-evaluation              AM-PAC PT "6 Clicks" Mobility   Outcome Measure  Help needed turning from your back to your side while in a flat bed without using bedrails?: A Lot Help needed moving from lying on your back to sitting on the side of a flat bed without using bedrails?: Total Help needed moving to and from a bed to a chair (including a wheelchair)?: Total Help needed standing up from a chair using your arms (e.g., wheelchair or bedside chair)?: Total Help needed to walk in hospital room?: Total Help needed climbing 3-5 steps with a railing? : Total 6 Click Score: 7    End of Session   Activity Tolerance: Patient tolerated treatment well;Patient limited by fatigue Patient left: with call bell/phone within reach;in bed;with bed alarm set;with family/visitor present Nurse Communication: Mobility status PT Visit Diagnosis: Unsteadiness on feet (R26.81);Other abnormalities of gait and mobility (R26.89);Muscle weakness (generalized) (M62.81)     Time: [id number] PT Time Calculation (min) (ACUTE ONLY): 25 min  Charges:                        [place] [facility] [place] 01/27/2023, 3:53  PM

## 2023-01-27 NOTE — Progress Notes (Signed)
Occupational Therapy Treatment Patient Details Name: Natasha Woodard MRN: [id number] DOB: September 09, 1941 Today's Date: 01/27/2023   History of present illness 82 yo female admitted 1/30 with chest pain, NSTEMI s/p cardiac cath and IABP placed. Pt with bleeding and impaired circulation with IABP removed 1/31 and thrombin injection to pseudoaneurysm. 2/4 Rt groin pseudoaneurysm rupture s/p repair with placement of wound VAC. Pt with VT arrest on 2/8. ICD placed 2/12. Pt with rt groin washout and debridement and sartorius muscle flap with VAC placement on 2/15. PMhx: CAD, dilated cardiomyopathy, CKD, GERD, HFpEF, HTN, HLD, T2DM   OT comments  Patient reassess this date post procedure.  Patient is needing more assist than her original evaluation, but continues to be motivated to return to her baseline.  Patient needing Max A for lower body ADL at bedlevel, and is now needing +2 for basic stand pivot transfers.  OT is indicated in the acute setting to address deficits listed, and AIR continues to be recommended for post acute rehab prior to returning home.     Recommendations for follow up therapy are one component of a multi-disciplinary discharge planning process, led by the attending physician.  Recommendations may be updated based on patient status, additional functional criteria and insurance authorization.    Follow Up Recommendations  Acute inpatient rehab (3hours/day)     Assistance Recommended at Discharge Frequent or constant Supervision/Assistance  Patient can return home with the following  A lot of help with bathing/dressing/bathroom;Assistance with cooking/housework;Direct supervision/assist for medications management;Direct supervision/assist for financial management;Assist for transportation;Help with stairs or ramp for entrance;Two people to help with walking and/or transfers   Equipment Recommendations  None recommended by OT    Recommendations for Other Services      Precautions  / Restrictions Precautions Precautions: Fall;ICD/Pacemaker;Other (comment) Precaution Comments: VAC rt groin Restrictions Weight Bearing Restrictions: No       Mobility Bed Mobility Overal bed mobility: Needs Assistance Bed Mobility: Supine to Sit, Sit to Sidelying     Supine to sit: Mod assist, +2 for physical assistance, HOB elevated   Sit to sidelying: +2 for physical assistance, Mod assist   Patient Response: Cooperative  Transfers Overall transfer level: Needs assistance Equipment used: Rolling walker (2 wheels) Transfers: Sit to/from Stand, Bed to chair/wheelchair/BSC Sit to Stand: Mod assist, +2 physical assistance     Step pivot transfers: +2 physical assistance, Min assist           Balance Overall balance assessment: Needs assistance Sitting-balance support: Bilateral upper extremity supported, Feet supported Sitting balance-Leahy Scale: Poor     Standing balance support: Bilateral upper extremity supported, During functional activity, Reliant on assistive device for balance Standing balance-Leahy Scale: Poor                             ADL either performed or assessed with clinical judgement   ADL Overall ADL's : Needs assistance/impaired Eating/Feeding: Set up;Bed level   Grooming: Sitting;Minimal assistance   Upper Body Bathing: Moderate assistance;Bed level   Lower Body Bathing: Maximal assistance;Bed level   Upper Body Dressing : Moderate assistance;Sitting   Lower Body Dressing: Maximal assistance;Bed level   Toilet Transfer: Moderate assistance;+2 for physical assistance;Rolling walker (2 wheels);BSC/3in1   Toileting- Clothing Manipulation and Hygiene: Total assistance;+2 for physical assistance;+2 for safety/equipment;Sit to/from stand              Extremity/Trunk Assessment Upper Extremity Assessment Upper Extremity Assessment: Generalized weakness  Lower Extremity Assessment Lower Extremity Assessment: Defer to PT  evaluation   Cervical / Trunk Assessment Cervical / Trunk Assessment: Normal    Vision   Vision Assessment?: No apparent visual deficits   Perception Perception Perception: Within Functional Limits   Praxis Praxis Praxis: Intact    Cognition Arousal/Alertness: Awake/alert Behavior During Therapy: Flat affect Overall Cognitive Status: Impaired/Different from baseline                     Current Attention Level: Sustained Memory: Decreased short-term memory   Safety/Judgement: Decreased awareness of safety, Decreased awareness of deficits Awareness: Emergent Problem Solving: Slow processing, Requires verbal cues          Exercises      Shoulder Instructions       General Comments VSS on RA    Pertinent Vitals/ Pain       Pain Assessment Pain Assessment: Faces Faces Pain Scale: Hurts little more Pain Location: rt groin Pain Descriptors / Indicators: Sore, Grimacing Pain Intervention(s): Monitored during session                                                          Frequency  Min 2X/week        Progress Toward Goals  OT Goals(current goals can now be found in the care plan section)  Progress towards OT goals: Progressing toward goals  Acute Rehab OT Goals OT Goal Formulation: With patient Time For Goal Achievement: 02/10/23 Potential to Achieve Goals: Good ADL Goals Pt Will Perform Grooming: sitting;with set-up Pt Will Perform Lower Body Dressing: sit to/from stand;with min assist Pt Will Transfer to Toilet: with min guard assist;stand pivot transfer;bedside commode Pt Will Perform Toileting - Clothing Manipulation and hygiene: with min assist;sit to/from stand  Plan Discharge plan remains appropriate    Co-evaluation    PT/OT/SLP Co-Evaluation/Treatment: Yes Reason for Co-Treatment: For patient/therapist safety;To address functional/ADL transfers   OT goals addressed during session: ADL's and self-care       AM-PAC OT "6 Clicks" Daily Activity     Outcome Measure   Help from another person eating meals?: A Little Help from another person taking care of personal grooming?: A Little Help from another person toileting, which includes using toliet, bedpan, or urinal?: A Lot Help from another person bathing (including washing, rinsing, drying)?: A Lot Help from another person to put on and taking off regular upper body clothing?: A Lot Help from another person to put on and taking off regular lower body clothing?: A Lot 6 Click Score: 14    End of Session Equipment Utilized During Treatment: Gait belt;Rolling walker (2 wheels)  OT Visit Diagnosis: Other abnormalities of gait and mobility (R26.89);Muscle weakness (generalized) (M62.81);Pain Pain - Right/Left: Right Pain - part of body: Leg   Activity Tolerance Patient tolerated treatment well   Patient Left in bed;with call bell/phone within reach   Nurse Communication Mobility status        Time: [id number] OT Time Calculation (min): 26 min  Charges: OT General Charges $OT Visit: 1 Visit OT Evaluation $OT Re-eval: 1 Re-eval  01/27/2023  RP, OTR/L  Acute Rehabilitation Services  Office:  [phone]   Metta Clines 01/27/2023, 4:20 PM

## 2023-01-27 NOTE — Progress Notes (Signed)
Pt on 1L [place] and tolerating with no distress noted. Pt refuses CPAP QHS but will continue to monitor

## 2023-01-28 DIAGNOSIS — E440 Moderate protein-calorie malnutrition: Secondary | ICD-10-CM | POA: Insufficient documentation

## 2023-01-28 DIAGNOSIS — I214 Non-ST elevation (NSTEMI) myocardial infarction: Secondary | ICD-10-CM | POA: Diagnosis not present

## 2023-01-28 LAB — BASIC METABOLIC PANEL
Anion gap: 11 (ref 5–15)
BUN: 45 mg/dL — ABNORMAL HIGH (ref 8–23)
CO2: 27 mmol/L (ref 22–32)
Calcium: 9.1 mg/dL (ref 8.9–10.3)
Chloride: 101 mmol/L (ref 98–111)
Creatinine, Ser: 1.66 mg/dL — ABNORMAL HIGH (ref 0.44–1.00)
GFR, Estimated: 31 mL/min — ABNORMAL LOW (ref >60)
Glucose, Bld: 206 mg/dL — ABNORMAL HIGH (ref 70–99)
Potassium: 4.7 mmol/L (ref 3.5–5.1)
Sodium: 139 mmol/L (ref 135–145)

## 2023-01-28 LAB — GLUCOSE, CAPILLARY
Glucose-Capillary: 125 mg/dL — ABNORMAL HIGH (ref 70–99)
Glucose-Capillary: 165 mg/dL — ABNORMAL HIGH (ref 70–99)
Glucose-Capillary: 155 mg/dL — ABNORMAL HIGH (ref 70–99)
Glucose-Capillary: 155 mg/dL — ABNORMAL HIGH (ref 70–99)

## 2023-01-28 LAB — MAGNESIUM: Magnesium: 2.3 mg/dL (ref 1.7–2.4)

## 2023-01-28 LAB — PREALBUMIN: Prealbumin: 14 mg/dL — ABNORMAL LOW (ref 18–38)

## 2023-01-28 NOTE — Progress Notes (Signed)
Pt was encouraged to continue working with PT and OT and do CRPII when ready. Pt understands importance of sodium reduction, taking meds, physical activity, and fluid restriction.   Will continue to follow, will work with pt when ambulatory.   [id number]  Christen Bame 01/28/2023 9:54 AM

## 2023-01-28 NOTE — Progress Notes (Signed)
To room via Bed. Pt A/O X4, MAEW. Oriented to room

## 2023-01-28 NOTE — Progress Notes (Signed)
Pt states she doesn't feel ready to have foley catheter removed.

## 2023-01-28 NOTE — Progress Notes (Signed)
Up to chair for supper. Maximum assist X2. Tolerated well

## 2023-01-28 NOTE — Progress Notes (Signed)
PROGRESS NOTE    Natasha Woodard  [id number] DOB: 20-May-1941 DOA: 01/10/2023 PCP: Tonia Ghent, MD  82 y/o female w/ prior h/o nonischemic CM that was PVC mediated, EF recovered w/ suppression of PVCs. Now admitted for acute lateral wall MI 2/2 distal LCx occlusion c/b severe ischemic MR and acute systolic heart failure w/ low output, CI 2.1 on RHC. Required placement of IABP, but later removed given concerns for developing limb ischemia=>found to have Right femoral PSA then ultimate rupture s/p emergent repair. VT arrest 2/8. ICD placed 2/12.   1/31 Underwent thrombin injection to right femoral PSA . Hgb drifting down 10.3>8.3>6.8  2/1 Started on amio drip to suppress PVCs. Got dose of IV lasix x1. CVP low.  2/4 Ruptured PSA. Returned to OR for repair. VAC in place 2/5 Midodrine 5 mg three times daily added.  Back in A fib.  2/7 TEE with mod MR.  2/8 VT arrest--> shock x1 ROSC after 2 minutes. Given lidocaine and amio was increased to 60 mg.  2/9 Transvenous pacemaker placed for overdrive pacing with PMVT.  2/11 Milrinone restarted, Co-ox 49% 2/12 PICC removed. S/p [employer] Sci ICD  2/14 Milrinone stopped . Amio drip stopped.  2/15 Returned to OR for washout and VAC change with sartorious muscle flap.  2/17: Transferred from heart failure team to [facility] service  Subjective: -Has complaints about the room, otherwise feels okay, would like to get out of bed with assistance  Assessment and Plan:  Acute on chronic systolic CHF Cardiogenic shock Severe MR with restricted posterior leaflet -Echo now w/ EF 20-25%, mildly reduced RV function, severe MR -TEE confirmed severe MR -Previously echo 2/23 noted EF of 50-55% -Admitted with lateral STEMI on 1/30, PL OM occlusion not sufficient to explain extent of cardiomyopathy per cards, suspected to be mixed cardiomyopathy -Developed cardiogenic shock, initially required balloon pump, then removed for right leg ischemia -Weaned off  inotropes -Was then restarted on milrinone 2/11 for low output, low Co-ox with rising creatinine -Now off milrinone, on p.o. Lasix, volume status has improved -Weaned off midodrine, GDMT was limited by hypotension -s/p DDD ICD per EP -Increase activity, DC Foley catheter, CIR evaluating  Lateral STEMI  CAD Cath noted distal occlusion of the circumflex marginal vessel. -treated with DES.   -Continue aspirin Plavix and statin  Right femoral pseudoaneurysm -Following balloon pump, -s/p thrombin injection by Dr. Carlis Abbott. 2/4 stat u/s with recurrent PSA with active bleeding into PSA. S/p PSA repair with VAC placement.  2/15 VAC change with sartorious muscle flap.  -Per vascular continue right groin wound VAC for 1 week -Will discontinue Ancef tomorrow  AKI on CKD stage 3b:  -Baseline 1.3-1.5.  -Creatinine peaked at 2, milrinone was restarted, now off, now improving down to 1.6 today   Paroxysmal atrial fibrillation PVCs -On amiodarone, continued -Per EP hold anticoagulation X 1 week post ICD placement, resume 2/19  Acute blood loss anemia -Repeat CBC tomorrow  VT Arrest: 2/7 -DCCV x 1 with CPR, ROSC after 2 minutes.  Polymorphic VT, appeared bradycardia-mediated with intermittent junctional rhythm. - s/p DDD ICD on 2/12 - Continue amiodarone.      DVT prophylaxis: SCDs Code Status: Full code Family Communication: Daughter at bedside Disposition Plan: Possibly CIR, to be determined  Antimicrobials:    Objective: Vitals:   01/27/23 2030 01/28/23 0026 01/28/23 0500 01/28/23 0613  BP:  127/61 121/67 128/62  Pulse:   70 70  Resp:   (!) 22 14  Temp:  98.1 F (36.7 C) 98.1 F (36.7 C) 98.1 F (36.7 C)  TempSrc:  Oral Oral Oral  SpO2: 97%   96%  Weight:    76.5 kg  Height:        Intake/Output Summary (Last 24 hours) at 01/28/2023 1122 Last data filed at 01/28/2023 [id number] Gross per 24 hour  Intake 340 ml  Output 1300 ml  Net -960 ml   Filed Weights   01/26/23 0500  01/27/23 0500 01/28/23 [id number]  Weight: 74.8 kg 76.5 kg 76.5 kg    Examination:  General exam: Chronically ill female sitting up in bed, AAOx3, no distress HEENT: No JVD CVS: S1-S2, regular rhythm Lungs: Decreased breath sounds at the bases Abdomen: Soft, mildly distended, nontender, bowel sounds present, right groin wound VAC noted Extremities: Trace edema, Skin with significant ichthyosis, scaling Psychiatry:  Mood & affect appropriate.     Data Reviewed:   CBC: Recent Labs  Lab 01/22/23 0432 01/23/23 0500 01/24/23 0634 01/25/23 0633 01/26/23 0441  WBC 10.7* 11.4* 11.2* 9.7 9.0  HGB 8.6* 8.1* 7.9* 8.2* 8.4*  HCT 26.0* 26.3* 25.0* 26.5* 26.9*  MCV 92.5 96.3 95.1 95.7 95.4  PLT 539* 548* 518* 475* [id number]   Basic Metabolic Panel: Recent Labs  Lab 01/23/23 0500 01/24/23 0634 01/24/23 1150 01/25/23 0633 01/26/23 0441 01/27/23 0244 01/28/23 0042  NA 136  --  138 138 139 137 139  K 4.1  --  4.0 4.1 4.3 4.8 4.7  CL 97*  --  101 101 101 102 101  CO2 27  --  24 26 26 24 27  $ GLUCOSE 200*  --  147* 99 100* 160* 206*  BUN 40*  --  40* 40* 38* 44* 45*  CREATININE 1.97*   < > 1.97* 1.85* 1.80* 1.89* 1.66*  CALCIUM 8.5*  --  8.8* 8.8* 8.9 9.0 9.1  MG 2.1  --   --  2.3 2.5* 2.3 2.3   < > = values in this interval not displayed.   GFR: Estimated Creatinine Clearance: 25 mL/min (A) (by C-G formula based on SCr of 1.66 mg/dL (H)). Liver Function Tests: No results for input(s): "AST", "ALT", "ALKPHOS", "BILITOT", "PROT", "ALBUMIN" in the last 168 hours. No results for input(s): "LIPASE", "AMYLASE" in the last 168 hours. No results for input(s): "AMMONIA" in the last 168 hours. Coagulation Profile: No results for input(s): "INR", "PROTIME" in the last 168 hours. Cardiac Enzymes: No results for input(s): "CKTOTAL", "CKMB", "CKMBINDEX", "TROPONINI" in the last 168 hours. BNP (last 3 results) Recent Labs    06/09/22 1506  PROBNP 4,638*   HbA1C: No results for input(s):  "HGBA1C" in the last 72 hours. CBG: Recent Labs  Lab 01/27/23 0759 01/27/23 1136 01/27/23 1615 01/27/23 2132 01/28/23 0611  GLUCAP 121* 195* 141* 186* 125*   Lipid Profile: No results for input(s): "CHOL", "HDL", "LDLCALC", "TRIG", "CHOLHDL", "LDLDIRECT" in the last 72 hours. Thyroid Function Tests: No results for input(s): "TSH", "T4TOTAL", "FREET4", "T3FREE", "THYROIDAB" in the last 72 hours. Anemia Panel: No results for input(s): "VITAMINB12", "FOLATE", "FERRITIN", "TIBC", "IRON", "RETICCTPCT" in the last 72 hours. Urine analysis: No results found for: "COLORURINE", "APPEARANCEUR", "LABSPEC", "PHURINE", "GLUCOSEU", "HGBUR", "BILIRUBINUR", "KETONESUR", "PROTEINUR", "UROBILINOGEN", "NITRITE", "LEUKOCYTESUR" Sepsis Labs: @LABRCNTIP$ (procalcitonin:4,lacticidven:4)  ) Recent Results (from the past 240 hour(s))  Surgical PCR screen     Status: None   Collection Time: 01/20/23  9:17 AM   Specimen: Nasal Mucosa; Nasal Swab  Result Value Ref Range Status   MRSA, PCR NEGATIVE NEGATIVE Final  Staphylococcus aureus NEGATIVE NEGATIVE Final    Comment: (NOTE) The Xpert SA Assay (FDA approved for NASAL specimens in patients 46 years of age and older), is one component of a comprehensive surveillance program. It is not intended to diagnose infection nor to guide or monitor treatment. Performed at [facility], [place] [address], [place], [place] [address]   Aerobic/Anaerobic Culture w Gram Stain (surgical/deep wound)     Status: None (Preliminary result)   Collection Time: 01/26/23 11:08 AM   Specimen: PATH Other; Tissue  Result Value Ref Range Status   Specimen Description GROIN  Final   Special Requests WOUND  Final   Gram Stain NO WBC SEEN NO ORGANISMS SEEN   Final   Culture   Final    MODERATE MORGANELLA MORGANII MODERATE ENTEROCOCCUS FAECALIS CULTURE REINCUBATED FOR BETTER GROWTH Performed at [facility], [place] [address], [place], [place] [address]    Report  Status PENDING  Incomplete     Radiology Studies: No results found.   Scheduled Meds:  acetaminophen  650 mg Oral Q6H   amiodarone  200 mg Oral BID   aspirin  81 mg Oral Daily   Chlorhexidine Gluconate Cloth  6 each Topical Daily   clopidogrel  75 mg Oral Daily   feeding supplement  237 mL Oral TID BM   fentaNYL (SUBLIMAZE) injection  50 mcg Intravenous Once   furosemide  40 mg Oral Daily   insulin aspart  0-15 Units Subcutaneous TID WC   lidocaine  1 patch Transdermal Q24H   lidocaine  1 patch Transdermal Q24H   rosuvastatin  10 mg Oral QHS   sodium chloride flush  10-40 mL Intracatheter Q12H   sodium chloride flush  3 mL Intravenous Q12H   sodium chloride flush  3 mL Intravenous Q12H   Continuous Infusions:  sodium chloride 250 mL (01/23/23 1235)    ceFAZolin (ANCEF) IV 2 g (01/28/23 0947)     LOS: 17 days    Time spent: 59mn    PDomenic Polite MD [facility]   01/28/2023, 11:22 AM

## 2023-01-29 DIAGNOSIS — I214 Non-ST elevation (NSTEMI) myocardial infarction: Secondary | ICD-10-CM | POA: Diagnosis not present

## 2023-01-29 LAB — BASIC METABOLIC PANEL
Anion gap: 11 (ref 5–15)
BUN: 41 mg/dL — ABNORMAL HIGH (ref 8–23)
CO2: 26 mmol/L (ref 22–32)
Calcium: 9.3 mg/dL (ref 8.9–10.3)
Chloride: 104 mmol/L (ref 98–111)
Creatinine, Ser: 1.52 mg/dL — ABNORMAL HIGH (ref 0.44–1.00)
GFR, Estimated: 34 mL/min — ABNORMAL LOW (ref >60)
Glucose, Bld: 125 mg/dL — ABNORMAL HIGH (ref 70–99)
Potassium: 4.8 mmol/L (ref 3.5–5.1)
Sodium: 141 mmol/L (ref 135–145)

## 2023-01-29 LAB — CBC
HCT: 28.5 % — ABNORMAL LOW (ref 36.0–46.0)
Hemoglobin: 8.7 g/dL — ABNORMAL LOW (ref 12.0–15.0)
MCH: 30 pg (ref 26.0–34.0)
MCHC: 30.5 g/dL (ref 30.0–36.0)
MCV: 98.3 fL (ref 80.0–100.0)
Platelets: 323 10*3/uL (ref 150–400)
RBC: 2.9 MIL/uL — ABNORMAL LOW (ref 3.87–5.11)
RDW: 18.6 % — ABNORMAL HIGH (ref 11.5–15.5)
WBC: 9.9 10*3/uL (ref 4.0–10.5)
nRBC: 0.4 % — ABNORMAL HIGH (ref 0.0–0.2)

## 2023-01-29 LAB — GLUCOSE, CAPILLARY
Glucose-Capillary: 109 mg/dL — ABNORMAL HIGH (ref 70–99)
Glucose-Capillary: 195 mg/dL — ABNORMAL HIGH (ref 70–99)
Glucose-Capillary: 138 mg/dL — ABNORMAL HIGH (ref 70–99)
Glucose-Capillary: 215 mg/dL — ABNORMAL HIGH (ref 70–99)

## 2023-01-29 LAB — MAGNESIUM: Magnesium: 2.4 mg/dL (ref 1.7–2.4)

## 2023-01-29 MED ORDER — SODIUM CHLORIDE 0.9 % IV SOLN
250.0000 mg | Freq: Every day | INTRAVENOUS | Status: DC
  Administered 2023-01-29 – 2023-01-30 (×2): 250 mg via INTRAVENOUS
  Filled 2023-01-29 (×3): qty 20

## 2023-01-29 MED ORDER — FUROSEMIDE 40 MG PO TABS
40.0000 mg | ORAL_TABLET | Freq: Two times a day (BID) | ORAL | Status: DC
  Administered 2023-01-29 – 2023-01-30 (×2): 40 mg via ORAL
  Filled 2023-01-29 (×2): qty 1

## 2023-01-29 MED ORDER — ENOXAPARIN SODIUM 30 MG/0.3ML IJ SOSY
30.0000 mg | PREFILLED_SYRINGE | INTRAMUSCULAR | Status: DC
  Administered 2023-01-29 – 2023-02-01 (×4): 30 mg via SUBCUTANEOUS
  Filled 2023-01-29 (×4): qty 0.3

## 2023-01-29 MED ORDER — CEFAZOLIN SODIUM-DEXTROSE 2-4 GM/100ML-% IV SOLN
2.0000 g | Freq: Two times a day (BID) | INTRAVENOUS | Status: AC
  Administered 2023-01-29: 2 g via INTRAVENOUS
  Filled 2023-01-29: qty 100

## 2023-01-29 NOTE — Progress Notes (Addendum)
Mobility Specialist Progress Note    01/29/23 1115  Mobility  Activity Ambulated with assistance to bathroom;Ambulated with assistance in hallway  Level of Assistance +2 (takes two people) (chair follow)  Assistive Device Front wheel walker  Distance Ambulated (ft) 65 ft (10+55)  Activity Response Tolerated well  Mobility Referral Yes  $Mobility charge 1 Mobility   Pt received in bed and agreeable. C/o 8/10 pain. Pt min to modA + 1 to stand from surfaces. Had BM in BR. Rolled back to room and left in chair with call bell in reach and alarm on.   Hildred Alamin Mobility Specialist  Please Contact via SecureChat or Rehab Office at [phone]

## 2023-01-29 NOTE — Progress Notes (Addendum)
PROGRESS NOTE    Natasha Woodard  [id number] DOB: 03-13-1941 DOA: 01/10/2023 PCP: Tonia Ghent, MD  82 y/o female w/ prior h/o nonischemic CM that was PVC mediated, EF recovered w/ suppression of PVCs. Now admitted for acute lateral wall MI 2/2 distal LCx occlusion c/b severe ischemic MR and acute systolic heart failure w/ low output, CI 2.1 on RHC. Required placement of IABP, but later removed given concerns for developing limb ischemia=>found to have Right femoral PSA then ultimate rupture s/p emergent repair. VT arrest 2/8. ICD placed 2/12.   1/31 Underwent thrombin injection to right femoral PSA . Hgb drifting down 10.3>8.3>6.8  2/1 Started on amio drip to suppress PVCs. Got dose of IV lasix x1. CVP low.  2/4 Ruptured PSA. Returned to OR for repair. VAC in place 2/5 Midodrine 5 mg three times daily added.  Back in A fib.  2/7 TEE with mod MR.  2/8 VT arrest--> shock x1 ROSC after 2 minutes. Given lidocaine and amio was increased to 60 mg.  2/9 Transvenous pacemaker placed for overdrive pacing with PMVT.  2/11 Milrinone restarted, Co-ox 49% 2/12 PICC removed. S/p [employer] Sci ICD  2/14 Milrinone stopped . Amio drip stopped.  2/15 Returned to OR for washout and VAC change with sartorious muscle flap.  2/17: Transferred from heart failure team to [facility] service 2/18, Lasix dose increased, weight trending up  Subjective: -Feels okay, wants to go home soon  Assessment and Plan:  Acute on chronic systolic CHF Cardiogenic shock Severe MR with restricted posterior leaflet -Echo now w/ EF 20-25%, mildly reduced RV function, severe MR -TEE confirmed severe MR -Previously echo 2/23 noted EF of 50-55% -Admitted with lateral STEMI on 1/30, PL OM occlusion not sufficient to explain extent of cardiomyopathy per cards, suspected to be mixed cardiomyopathy -Developed cardiogenic shock, initially required balloon pump, then removed for right leg ischemia -Weaned off inotropes -Was then  restarted on milrinone 2/11 for low output, low Co-ox with rising creatinine -Now off milrinone, on p.o. Lasix, weight trending up, will increase Lasix to twice daily -Weaned off midodrine, GDMT was limited by hypotension -s/p DDD ICD per EP, resume anticoag 2/19 -Discharge planning, DC Foley, CIR to follow-up tomorrow  Lateral STEMI  CAD Cath noted distal occlusion of the circumflex marginal vessel. -treated with DES.   -Continue aspirin Plavix and statin  Right femoral pseudoaneurysm -Following balloon pump, -s/p thrombin injection by Dr. Carlis Abbott. 2/4 stat u/s with recurrent PSA with active bleeding into PSA. S/p PSA repair with VAC placement.  2/15 VAC change with sartorious muscle flap.  -Per vascular continue right groin wound VAC for 1 week -Will discontinue Ancef today  AKI on CKD stage 3b:  -Baseline 1.3-1.5.  -Creatinine peaked at 2, milrinone was restarted, now off, now improving down to 1.5 today   Paroxysmal atrial fibrillation PVCs -On amiodarone, continued -Per EP hold anticoagulation X 1 week post ICD placement, resume 2/19  Chronic anemia -Secondary to blood loss and iron deficiency -Anemia panel 2/24 with severe iron deficiency, add IV iron  VT Arrest: 2/7 -DCCV x 1 with CPR, ROSC after 2 minutes.  Polymorphic VT, appeared bradycardia-mediated with intermittent junctional rhythm. - s/p DDD ICD on 2/12 - Continue amiodarone.      DVT prophylaxis: Add Lovenox Code Status: Full code Family Communication: No family at bedside today Disposition Plan: Possibly CIR, to be determined  Antimicrobials:    Objective: Vitals:   01/28/23 2224 01/29/23 0325 01/29/23 0327 01/29/23 0757  BP: (!) 124/58 114/63  117/67  Pulse: 69 71  73  Resp: 19 19  17  $ Temp: 98.5 F (36.9 C) 98.3 F (36.8 C)  98.5 F (36.9 C)  TempSrc: Oral Oral  Oral  SpO2: 97% 100%  100%  Weight:   78.9 kg   Height:        Intake/Output Summary (Last 24 hours) at 01/29/2023 1329 Last  data filed at 01/29/2023 1210 Gross per 24 hour  Intake 820 ml  Output 1550 ml  Net -730 ml   Filed Weights   01/27/23 0500 01/28/23 0613 01/29/23 0327  Weight: 76.5 kg 76.5 kg 78.9 kg    Examination:  General exam: Chronically ill female sitting up in bed, AAOx3, no distress HEENT: No JVD CVS: S1-S2, regular rhythm Lungs: Decreased breath sounds to bases Abdomen: Soft, mildly distended, nontender, bowel sounds present, right groin wound VAC noted Extremities: Trace edema  Skin with significant ichthyosis, scaling Psychiatry:  Mood & affect appropriate.     Data Reviewed:   CBC: Recent Labs  Lab 01/23/23 0500 01/24/23 0634 01/25/23 0633 01/26/23 0441 01/29/23 0759  WBC 11.4* 11.2* 9.7 9.0 9.9  HGB 8.1* 7.9* 8.2* 8.4* 8.7*  HCT 26.3* 25.0* 26.5* 26.9* 28.5*  MCV 96.3 95.1 95.7 95.4 98.3  PLT 548* 518* 475* 393 [id number]   Basic Metabolic Panel: Recent Labs  Lab 01/25/23 0633 01/26/23 0441 01/27/23 0244 01/28/23 0042 01/29/23 0759  NA 138 139 137 139 141  K 4.1 4.3 4.8 4.7 4.8  CL 101 101 102 101 104  CO2 26 26 24 27 26  $ GLUCOSE 99 100* 160* 206* 125*  BUN 40* 38* 44* 45* 41*  CREATININE 1.85* 1.80* 1.89* 1.66* 1.52*  CALCIUM 8.8* 8.9 9.0 9.1 9.3  MG 2.3 2.5* 2.3 2.3 2.4   GFR: Estimated Creatinine Clearance: 27.7 mL/min (A) (by C-G formula based on SCr of 1.52 mg/dL (H)). Liver Function Tests: No results for input(s): "AST", "ALT", "ALKPHOS", "BILITOT", "PROT", "ALBUMIN" in the last 168 hours. No results for input(s): "LIPASE", "AMYLASE" in the last 168 hours. No results for input(s): "AMMONIA" in the last 168 hours. Coagulation Profile: No results for input(s): "INR", "PROTIME" in the last 168 hours. Cardiac Enzymes: No results for input(s): "CKTOTAL", "CKMB", "CKMBINDEX", "TROPONINI" in the last 168 hours. BNP (last 3 results) Recent Labs    06/09/22 1506  PROBNP 4,638*   HbA1C: No results for input(s): "HGBA1C" in the last 72 hours. CBG: Recent  Labs  Lab 01/28/23 1146 01/28/23 1725 01/28/23 1949 01/29/23 0755 01/29/23 1224  GLUCAP 165* 155* 155* 109* 195*   Lipid Profile: No results for input(s): "CHOL", "HDL", "LDLCALC", "TRIG", "CHOLHDL", "LDLDIRECT" in the last 72 hours. Thyroid Function Tests: No results for input(s): "TSH", "T4TOTAL", "FREET4", "T3FREE", "THYROIDAB" in the last 72 hours. Anemia Panel: No results for input(s): "VITAMINB12", "FOLATE", "FERRITIN", "TIBC", "IRON", "RETICCTPCT" in the last 72 hours. Urine analysis: No results found for: "COLORURINE", "APPEARANCEUR", "LABSPEC", "PHURINE", "GLUCOSEU", "HGBUR", "BILIRUBINUR", "KETONESUR", "PROTEINUR", "UROBILINOGEN", "NITRITE", "LEUKOCYTESUR" Sepsis Labs: @LABRCNTIP$ (procalcitonin:4,lacticidven:4)  ) Recent Results (from the past 240 hour(s))  Surgical PCR screen     Status: None   Collection Time: 01/20/23  9:17 AM   Specimen: Nasal Mucosa; Nasal Swab  Result Value Ref Range Status   MRSA, PCR NEGATIVE NEGATIVE Final   Staphylococcus aureus NEGATIVE NEGATIVE Final    Comment: (NOTE) The Xpert SA Assay (FDA approved for NASAL specimens in patients 54 years of age and older), is one component of  a comprehensive surveillance program. It is not intended to diagnose infection nor to guide or monitor treatment. Performed at [facility], [place] [address] [place], [place] [address]   Aerobic/Anaerobic Culture w Gram Stain (surgical/deep wound)     Status: None (Preliminary result)   Collection Time: 01/26/23 11:08 AM   Specimen: PATH Other; Tissue  Result Value Ref Range Status   Specimen Description GROIN  Final   Special Requests WOUND  Final   Gram Stain NO WBC SEEN NO ORGANISMS SEEN   Final   Culture   Final    MODERATE MORGANELLA MORGANII MODERATE ENTEROCOCCUS FAECALIS SUSCEPTIBILITIES TO FOLLOW HOLDING FOR POSSIBLE ANAEROBE Performed at [facility], [place] [address], [place], [place] [address]    Report Status PENDING  Incomplete    Organism ID, Bacteria MORGANELLA MORGANII  Final      Susceptibility   Morganella morganii - MIC*    AMPICILLIN >=32 RESISTANT Resistant     CEFTAZIDIME <=1 SENSITIVE Sensitive     CIPROFLOXACIN <=0.25 SENSITIVE Sensitive     GENTAMICIN <=1 SENSITIVE Sensitive     IMIPENEM 2 SENSITIVE Sensitive     TRIMETH/SULFA <=20 SENSITIVE Sensitive     AMPICILLIN/SULBACTAM 16 INTERMEDIATE Intermediate     PIP/TAZO <=4 SENSITIVE Sensitive     * MODERATE MORGANELLA MORGANII     Radiology Studies: No results found.   Scheduled Meds:  acetaminophen  650 mg Oral Q6H   amiodarone  200 mg Oral BID   aspirin  81 mg Oral Daily   Chlorhexidine Gluconate Cloth  6 each Topical Daily   clopidogrel  75 mg Oral Daily   feeding supplement  237 mL Oral TID BM   fentaNYL (SUBLIMAZE) injection  50 mcg Intravenous Once   furosemide  40 mg Oral BID   insulin aspart  0-15 Units Subcutaneous TID WC   lidocaine  1 patch Transdermal Q24H   lidocaine  1 patch Transdermal Q24H   rosuvastatin  10 mg Oral QHS   sodium chloride flush  10-40 mL Intracatheter Q12H   sodium chloride flush  3 mL Intravenous Q12H   sodium chloride flush  3 mL Intravenous Q12H   Continuous Infusions:  sodium chloride 250 mL (01/23/23 1235)    ceFAZolin (ANCEF) IV 2 g (01/29/23 0859)   ferric gluconate (FERRLECIT) IVPB 250 mg (01/29/23 1220)     LOS: 18 days    Time spent: 90mn    PDomenic Polite MD [facility]   01/29/2023, 1:29 PM

## 2023-01-30 DIAGNOSIS — I214 Non-ST elevation (NSTEMI) myocardial infarction: Secondary | ICD-10-CM | POA: Diagnosis not present

## 2023-01-30 LAB — ACID FAST SMEAR (AFB, MYCOBACTERIA): Acid Fast Smear: NEGATIVE

## 2023-01-30 LAB — BASIC METABOLIC PANEL
Anion gap: 12 (ref 5–15)
BUN: 37 mg/dL — ABNORMAL HIGH (ref 8–23)
CO2: 30 mmol/L (ref 22–32)
Calcium: 9.6 mg/dL (ref 8.9–10.3)
Chloride: 101 mmol/L (ref 98–111)
Creatinine, Ser: 1.47 mg/dL — ABNORMAL HIGH (ref 0.44–1.00)
GFR, Estimated: 35 mL/min — ABNORMAL LOW (ref >60)
Glucose, Bld: 127 mg/dL — ABNORMAL HIGH (ref 70–99)
Potassium: 5.1 mmol/L (ref 3.5–5.1)
Sodium: 143 mmol/L (ref 135–145)

## 2023-01-30 LAB — GLUCOSE, CAPILLARY
Glucose-Capillary: 130 mg/dL — ABNORMAL HIGH (ref 70–99)
Glucose-Capillary: 144 mg/dL — ABNORMAL HIGH (ref 70–99)
Glucose-Capillary: 134 mg/dL — ABNORMAL HIGH (ref 70–99)
Glucose-Capillary: 159 mg/dL — ABNORMAL HIGH (ref 70–99)

## 2023-01-30 LAB — CBC
HCT: 28.8 % — ABNORMAL LOW (ref 36.0–46.0)
Hemoglobin: 8.5 g/dL — ABNORMAL LOW (ref 12.0–15.0)
MCH: 29.9 pg (ref 26.0–34.0)
MCHC: 29.5 g/dL — ABNORMAL LOW (ref 30.0–36.0)
MCV: 101.4 fL — ABNORMAL HIGH (ref 80.0–100.0)
Platelets: 355 10*3/uL (ref 150–400)
RBC: 2.84 MIL/uL — ABNORMAL LOW (ref 3.87–5.11)
RDW: 19.3 % — ABNORMAL HIGH (ref 11.5–15.5)
WBC: 9.7 10*3/uL (ref 4.0–10.5)
nRBC: 0.3 % — ABNORMAL HIGH (ref 0.0–0.2)

## 2023-01-30 LAB — AEROBIC/ANAEROBIC CULTURE W GRAM STAIN (SURGICAL/DEEP WOUND): Gram Stain: NONE SEEN

## 2023-01-30 LAB — MAGNESIUM: Magnesium: 2.1 mg/dL (ref 1.7–2.4)

## 2023-01-30 MED ORDER — PIPERACILLIN-TAZOBACTAM 3.375 G IVPB: 3.3750 g | Freq: Three times a day (TID) | INTRAVENOUS | Status: DC

## 2023-01-30 MED ORDER — VANCOMYCIN HCL IN DEXTROSE 1-5 GM/200ML-% IV SOLN: 1000.0000 mg | INTRAVENOUS | Status: DC

## 2023-01-30 MED ORDER — VANCOMYCIN HCL 1500 MG/300ML IV SOLN
1500.0000 mg | Freq: Once | INTRAVENOUS | Status: DC
  Administered 2023-01-30: 1500 mg via INTRAVENOUS
  Filled 2023-01-30 (×2): qty 300

## 2023-01-30 MED ORDER — PIPERACILLIN-TAZOBACTAM 3.375 G IVPB
3.3750 g | Freq: Three times a day (TID) | INTRAVENOUS | Status: DC
  Administered 2023-01-31 – 2023-02-14 (×42): 3.375 g via INTRAVENOUS
  Filled 2023-01-30 (×42): qty 50

## 2023-01-30 MED ORDER — SODIUM CHLORIDE 0.9 % IV SOLN: 2.0000 g | INTRAVENOUS | Status: DC

## 2023-01-30 MED ORDER — ASPIRIN 81 MG PO CHEW
81.0000 mg | CHEWABLE_TABLET | Freq: Every day | ORAL | Status: DC
  Administered 2023-02-01 – 2023-02-02 (×2): 81 mg via ORAL
  Filled 2023-01-30 (×2): qty 1

## 2023-01-30 MED ORDER — FUROSEMIDE 10 MG/ML IJ SOLN
40.0000 mg | Freq: Two times a day (BID) | INTRAMUSCULAR | Status: DC
  Administered 2023-01-30 – 2023-02-01 (×3): 40 mg via INTRAVENOUS
  Filled 2023-01-30 (×3): qty 4

## 2023-01-30 MED ORDER — SODIUM CHLORIDE 0.9 % IV SOLN
2.0000 g | INTRAVENOUS | Status: DC
  Filled 2023-01-30: qty 2

## 2023-01-30 MED ORDER — AMIODARONE HCL 200 MG PO TABS
200.0000 mg | ORAL_TABLET | Freq: Two times a day (BID) | ORAL | Status: DC
  Administered 2023-01-30 – 2023-02-18 (×38): 200 mg via ORAL
  Filled 2023-01-30 (×38): qty 1

## 2023-01-30 MED ORDER — CLOPIDOGREL BISULFATE 75 MG PO TABS
75.0000 mg | ORAL_TABLET | Freq: Every day | ORAL | Status: DC
  Administered 2023-02-01 – 2023-02-19 (×18): 75 mg via ORAL
  Filled 2023-01-30 (×19): qty 1

## 2023-01-30 MED ORDER — PIPERACILLIN-TAZOBACTAM 3.375 G IVPB: 3.3750 g | INTRAVENOUS | Status: DC

## 2023-01-30 MED ORDER — EMPAGLIFLOZIN 10 MG PO TABS
10.0000 mg | ORAL_TABLET | Freq: Every day | ORAL | Status: DC
  Filled 2023-01-30: qty 1

## 2023-01-30 MED ORDER — SODIUM CHLORIDE 0.9 % IV SOLN
2.0000 g | Freq: Once | INTRAVENOUS | Status: AC
  Administered 2023-01-30: 2 g via INTRAVENOUS
  Filled 2023-01-30: qty 2

## 2023-01-30 MED ORDER — MELATONIN 5 MG PO TABS
5.0000 mg | ORAL_TABLET | Freq: Every evening | ORAL | Status: AC | PRN
  Administered 2023-01-30 – 2023-01-31 (×2): 5 mg via ORAL
  Filled 2023-01-30 (×2): qty 1

## 2023-01-30 MED ORDER — SODIUM CHLORIDE 0.9 % IV SOLN
2.0000 g | Freq: Three times a day (TID) | INTRAVENOUS | Status: DC
  Filled 2023-01-30 (×3): qty 2

## 2023-01-30 MED FILL — Medication: Qty: 1 | Status: AC

## 2023-01-30 NOTE — Progress Notes (Signed)
Occupational Therapy Treatment Patient Details Name: Natasha Woodard MRN: [id number] DOB: [birth year] Today's Date: 01/30/2023   History of present illness 82 yo female admitted 1/30 with chest pain, NSTEMI s/p cardiac cath and IABP placed. Pt with bleeding and impaired circulation with IABP removed 1/31 and thrombin injection to pseudoaneurysm. 2/4 Rt groin pseudoaneurysm rupture s/p repair with placement of wound VAC. Pt with VT arrest on 2/8. ICD placed 2/12. Pt with rt groin washout and debridement and sartorius muscle flap with VAC placement on 2/15. PMhx: CAD, dilated cardiomyopathy, CKD, GERD, HFpEF, HTN, HLD, T2DM   OT comments  Making excellent progress. Very excited about being able to walk to the [facility] and assist with her self care. "That's the first time I've done that". Requires Min A +2 for safety/equipment and overall mod A for ADL tasks. Excellent candidate for AIR to maximize functional level of independence with goal of returning home with supportive family. Acute OT to continue to follow.    Recommendations for follow up therapy are one component of a multi-disciplinary discharge planning process, led by the attending physician.  Recommendations may be updated based on patient status, additional functional criteria and insurance authorization.    Follow Up Recommendations  Acute inpatient rehab (3hours/day)     Assistance Recommended at Discharge Frequent or constant Supervision/Assistance  Patient can return home with the following  A lot of help with bathing/dressing/bathroom;Assistance with cooking/housework;Direct supervision/assist for medications management;Direct supervision/assist for financial management;Assist for transportation;Help with stairs or ramp for entrance;Two people to help with walking and/or transfers   Equipment Recommendations  None recommended by OT    Recommendations for Other Services      Precautions / Restrictions Precautions Precautions:  Fall;ICD/Pacemaker;Other (comment) Precaution Comments: VAC rt groin Restrictions Weight Bearing Restrictions: No       Mobility Bed Mobility               General bed mobility comments: OOB in chair    Transfers Overall transfer level: Needs assistance Equipment used: Rolling walker (2 wheels) Transfers: Sit to/from Stand, Bed to chair/wheelchair/BSC Sit to Stand: Min assist, +2 safety/equipment     Step pivot transfers: Min assist, +2 physical assistance     General transfer comment: ambulated @ 5 ft to [facility]     Balance Overall balance assessment: Needs assistance   Sitting balance-Leahy Scale: Good       Standing balance-Leahy Scale: Poor                             ADL either performed or assessed with clinical judgement   ADL Overall ADL's : Needs assistance/impaired Eating/Feeding: Modified independent   Grooming: Set up;Sitting   Upper Body Bathing: Minimal assistance;Sitting   Lower Body Bathing: Moderate assistance;Sit to/from stand   Upper Body Dressing : Moderate assistance   Lower Body Dressing: Moderate assistance;Sit to/from stand   Toilet Transfer: Minimal assistance;+2 for safety/equipment;Ambulation;BSC/3in1;Cueing for safety;Rolling walker (2 wheels)   Toileting- Clothing Manipulation and Hygiene: Moderate assistance       Functional mobility during ADLs: Minimal assistance;+2 for safety/equipment;Rolling walker (2 wheels);Cueing for safety;Cueing for sequencing   Reminders for restrictions of LUE due to pacemaker precautions; pt verbalizes understanding   Extremity/Trunk Assessment Upper Extremity Assessment Upper Extremity Assessment: Generalized weakness;LUE deficits/detail (pacemaker precautions) LUE Deficits / Details: generalized weakness   Lower Extremity Assessment Lower Extremity Assessment: Defer to PT evaluation        Vision  Perception     Praxis      Cognition Arousal/Alertness:  Awake/alert Behavior During Therapy: WFL for tasks assessed/performed Overall Cognitive Status: No family/caregiver present to determine baseline cognitive functioning                               Problem Solving: Slow processing (apropriate during session; most likely close to baseline)          Exercises      Shoulder Instructions       General Comments      Pertinent Vitals/ Pain       Pain Assessment Pain Assessment: Faces Faces Pain Scale: Hurts even more Pain Location: rt groin Pain Descriptors / Indicators: Sore, Grimacing Pain Intervention(s): Limited activity within patient's tolerance, Patient requesting pain meds-RN notified  Home Living                                          Prior Functioning/Environment              Frequency  Min 2X/week        Progress Toward Goals  OT Goals(current goals can now be found in the care plan section)  Progress towards OT goals: Progressing toward goals  Acute Rehab OT Goals Patient Stated Goal: to be more independent OT Goal Formulation: With patient Time For Goal Achievement: 02/10/23 Potential to Achieve Goals: Good ADL Goals Pt Will Perform Grooming: sitting;with set-up Pt Will Perform Lower Body Dressing: sit to/from stand;with min assist Pt Will Transfer to Toilet: with min guard assist;stand pivot transfer;bedside commode Pt Will Perform Toileting - Clothing Manipulation and hygiene: with min assist;sit to/from stand  Plan Discharge plan remains appropriate    Co-evaluation                 AM-PAC OT "6 Clicks" Daily Activity     Outcome Measure   Help from another person eating meals?: None Help from another person taking care of personal grooming?: A Little Help from another person toileting, which includes using toliet, bedpan, or urinal?: A Lot Help from another person bathing (including washing, rinsing, drying)?: A Lot Help from another person to put  on and taking off regular upper body clothing?: A Lot Help from another person to put on and taking off regular lower body clothing?: A Lot 6 Click Score: 15    End of Session Equipment Utilized During Treatment: Gait belt;Rolling walker (2 wheels)  OT Visit Diagnosis: Other abnormalities of gait and mobility (R26.89);Muscle weakness (generalized) (M62.81);Pain Pain - Right/Left: Right Pain - part of body: Leg   Activity Tolerance Patient tolerated treatment well   Patient Left in chair;with call bell/phone within reach;with chair alarm set   Nurse Communication Mobility status;Patient requests pain meds        Time: 0929-0952 OT Time Calculation (min): 23 min  Charges: OT General Charges $OT Visit: 1 Visit OT Treatments $Self Care/Home Management : 23-37 mins  Maurie Boettcher, OT/L   Acute OT Clinical Specialist [place] Pager [phone] Office [phone]   [facility] 01/30/2023, 10:02 AM

## 2023-01-30 NOTE — Progress Notes (Signed)
Inpatient Rehab Admissions Coordinator:   Continuing to follow for potential CIR admission. Per MD note today, she is planning to go back to OR tomorrow for wound washout. She is making progress with therapies. Will continue to watch for medical readiness and potential admission to CIR.   Rehab Admissons Coordinator [place], [place], [place] [phone]

## 2023-01-30 NOTE — Care Management Important Message (Signed)
Important Message  Patient Details  Name: Natasha Woodard MRN: [id number] Date of Birth: May 01, 1941   Medicare Important Message Given:  Yes     Hannah Beat 01/30/2023, 12:26 PM

## 2023-01-30 NOTE — Progress Notes (Signed)
Physical Therapy Treatment Patient Details Name: Natasha Woodard MRN: [id number] DOB: [birth year] Today's Date: 01/30/2023   History of Present Illness 82 yo female admitted 1/30 with chest pain, NSTEMI s/p cardiac cath and IABP placed. Pt with bleeding and impaired circulation with IABP removed 1/31 and thrombin injection to pseudoaneurysm. 2/4 Rt groin pseudoaneurysm rupture s/p repair with placement of wound VAC. Pt with VT arrest on 2/8. ICD placed 2/12. Pt with rt groin washout and debridement and sartorius muscle flap with VAC placement on 2/15. PMhx: CAD, dilated cardiomyopathy, CKD, GERD, HFpEF, HTN, HLD, T2DM    PT Comments    Pt was received sitting in the chair and agreeable to session with family present. Pt required grossly min A-mod A for mobility tasks. Pt required dense cues for RW management to avoid obstacles during gait trial and for safe hand placement during transfers. Gait distance was limited by pt's need to use the [facility]. Pt was able to transfer to [facility] with up to mod A for standing from recliner and demonstrated improved eccentric control with hands on BSC armrests. Pt was left on [facility] with call bell in reach and NT aware. Pt continues to benefit from PT services to progress toward functional mobility goals.    Recommendations for follow up therapy are one component of a multi-disciplinary discharge planning process, led by the attending physician.  Recommendations may be updated based on patient status, additional functional criteria and insurance authorization.  Follow Up Recommendations  Acute inpatient rehab (3hours/day)     Assistance Recommended at Discharge Frequent or constant Supervision/Assistance  Patient can return home with the following A little help with walking and/or transfers;A little help with bathing/dressing/bathroom;Help with stairs or ramp for entrance;Assist for transportation;Assistance with cooking/housework   Equipment Recommendations  Rolling  walker (2 wheels)    Recommendations for Other Services       Precautions / Restrictions Precautions Precautions: Fall;ICD/Pacemaker;Other (comment) Precaution Comments: VAC rt groin Restrictions Weight Bearing Restrictions: No     Mobility  Bed Mobility               General bed mobility comments: Pt sitting in recliner upon arrival    Transfers Overall transfer level: Needs assistance Equipment used: Rolling walker (2 wheels) Transfers: Sit to/from Stand Sit to Stand: Mod assist           General transfer comment: from recliner to RW x2 with min A for power up and cues for upright posture and safe hand placement    Ambulation/Gait Ambulation/Gait assistance: Min assist, +2 safety/equipment Gait Distance (Feet): 60 Feet Assistive device: Rolling walker (2 wheels) Gait Pattern/deviations: Step-through pattern, Trunk flexed, Drifts right/left Gait velocity: decr     General Gait Details: +2 for equipment and chair follow. Min A with RW management to avoid obstacles. Cues for RW proximity and pacing throughout. Pt required 2 standing recovery breaks.       Balance Overall balance assessment: Needs assistance Sitting-balance support: Bilateral upper extremity supported, Feet supported Sitting balance-Leahy Scale: Good Sitting balance - Comments: in recliner   Standing balance support: Bilateral upper extremity supported, During functional activity, Reliant on assistive device for balance Standing balance-Leahy Scale: Poor Standing balance comment: with RW management                            Cognition Arousal/Alertness: Awake/alert Behavior During Therapy: WFL for tasks assessed/performed Overall Cognitive Status: No family/caregiver present to determine baseline cognitive  functioning                                          Exercises      General Comments General comments (skin integrity, edema, etc.): VSS on RA. HR  as high as 121 bpm during gait trial.      Pertinent Vitals/Pain Pain Assessment Pain Assessment: Faces Faces Pain Scale: Hurts little more Pain Location: rt groin Pain Descriptors / Indicators: Sore, Grimacing Pain Intervention(s): Limited activity within patient's tolerance, Monitored during session     PT Goals (current goals can now be found in the care plan section) Acute Rehab PT Goals Patient Stated Goal: return home PT Goal Formulation: With patient Time For Goal Achievement: 02/10/23 Potential to Achieve Goals: Fair Progress towards PT goals: Progressing toward goals    Frequency    Min 3X/week      PT Plan Current plan remains appropriate       AM-PAC PT "6 Clicks" Mobility   Outcome Measure  Help needed turning from your back to your side while in a flat bed without using bedrails?: A Lot (not formally assessed) Help needed moving from lying on your back to sitting on the side of a flat bed without using bedrails?: Total (not formally assessed) Help needed moving to and from a bed to a chair (including a wheelchair)?: A Lot Help needed standing up from a chair using your arms (e.g., wheelchair or bedside chair)?: A Lot Help needed to walk in hospital room?: A Lot (+2 for equipment/chair follow) Help needed climbing 3-5 steps with a railing? : Total 6 Click Score: 10    End of Session Equipment Utilized During Treatment: Gait belt Activity Tolerance: Patient tolerated treatment well;Patient limited by fatigue Patient left: with call bell/phone within reach;with family/visitor present (on [facility] with NT aware) Nurse Communication: Mobility status (Pt on BSC for BM) PT Visit Diagnosis: Unsteadiness on feet (R26.81);Other abnormalities of gait and mobility (R26.89);Muscle weakness (generalized) (M62.81)     Time: [id number] PT Time Calculation (min) (ACUTE ONLY): 23 min  Charges:  $Gait Training: 8-22 mins $Therapeutic Activity: 8-22 mins                     Michelle Nasuti, PTA Acute Rehabilitation Services Secure Chat Preferred  [phone]    Michelle Nasuti 01/30/2023, 11:22 AM

## 2023-01-30 NOTE — Progress Notes (Addendum)
Patient ID: Natasha Woodard, female   DOB: October 20, 1941, 82 y.o.   MRN: [id number]     Advanced Heart Failure Rounding Note  PCP-Cardiologist: Dr. Ellyn Hack AHF: Dr. 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 Now admitted for acute lateral wall MI 2/2 distal LCx occlusion c/b severe ischemic MR and acute systolic heart failure w/ low output, CI 2.1 on RHC. Required placement of IABP, but later removed given concerns for developing limb ischemia=>found to have Right femoral PSA then ultimate rupture s/p emergent repair. VT arrest 2/8. ICD placed 2/12.  Subjective:   1/31 Underwent thrombin injection to right femoral PSA . Hgb drifting down 10.3>8.3>6.8  2/1 Started on amio drip to suppress PVCs. Got dose of IV lasix x1. CVP low.  2/4 Ruptured PSA. Returned to OR for repair. VAC in place 2/5 Midodrine 5 mg three times daily added.  Back in A fib.  2/7 TEE with mod MR.  2/8 VT arrest--> shock x1 ROSC after 2 minutes. Given lidocaine and amio was increased to 60 mg.  2/9 Transvenous pacemaker placed for overdrive pacing with PMVT.  2/11 Milrinone restarted, Co-ox 49% 2/12 PICC removed. S/p [employer] ICD  2/14 Milrinone stopped . Amio drip stopped.  2/15 Returned to OR for washout and VAC change with sartorious muscle flap.   Overall feels good today. Just finished working with PT. Got her up to the chair. Denies CP/SOB.   Plan for OR tomorrow for wash out and VAC change.  Wound culture growing morganella and enterococcus. Abx? per primary   Objective:   Weight Range: 78.9 kg Body mass index is 31.81 kg/m.   Vital Signs:   Temp:  [98.2 F (36.8 C)-98.5 F (36.9 C)] 98.2 F (36.8 C) (02/19 0730) Pulse Rate:  [64-70] 70 (02/19 0730) Resp:  [16-22] 16 (02/19 0730) BP: (115-137)/(56-71) 127/56 (02/19 0730) SpO2:  [94 %-100 %] 99 % (02/19 0730) Last BM Date : 01/28/23  Weight change: Filed Weights    01/27/23 0500 01/28/23 0613 01/29/23 0327  Weight: 76.5 kg 76.5 kg 78.9 kg    Intake/Output:   Intake/Output Summary (Last 24 hours) at 01/30/2023 0930 Last data filed at 01/30/2023 0012 Gross per 24 hour  Intake 370.08 ml  Output 200 ml  Net 170.08 ml     Physical Exam  General:  elderly appearing.  No respiratory difficulty HEENT: normal Neck: supple. JVD ~6. Carotids 2+ bilat; no bruits. No lymphadenopathy or thyromegaly appreciated. Cor: PMI nondisplaced. Regular rate & rhythm. No rubs, gallops or murmurs. Lungs: clear Abdomen: soft, nontender, nondistended. No hepatosplenomegaly. No bruits or masses. Good bowel sounds. Extremities: no cyanosis, clubbing, rash, non-pitting BLE edema. R groin VAC Neuro: alert & oriented x 3, cranial nerves grossly intact. moves all 4 extremities w/o difficulty. Affect pleasant.   Telemetry    A paced 80s 1-14 PVCs / hr (Personally reviewed)    Labs    CBC Recent Labs    01/29/23 0759 01/30/23 0712  WBC 9.9 9.7  HGB 8.7* 8.5*  HCT 28.5* 28.8*  MCV 98.3 101.4*  PLT 323 [id number]   Basic Metabolic Panel Recent Labs    01/29/23 0759 01/30/23 0712  NA 141 143  K 4.8 5.1  CL 104 101  CO2 26 30  GLUCOSE 125* 127*  BUN 41* 37*  CREATININE 1.52* 1.47*  CALCIUM 9.3 9.6  MG 2.4 2.1   BNP:  BNP (last 3 results) Recent Labs    06/17/22 1000 08/31/22 1613 01/10/23 1048  BNP 1,397.5* 1,178.5* >4,500.0*   Imaging   No results found.  Medications:     Scheduled Medications:  acetaminophen  650 mg Oral Q6H   amiodarone  200 mg Oral BID   aspirin  81 mg Oral Daily   Chlorhexidine Gluconate Cloth  6 each Topical Daily   clopidogrel  75 mg Oral Daily   enoxaparin (LOVENOX) injection  30 mg Subcutaneous Q24H   feeding supplement  237 mL Oral TID BM   fentaNYL (SUBLIMAZE) injection  50 mcg Intravenous Once   furosemide  40 mg Oral BID   insulin aspart  0-15 Units Subcutaneous TID WC   lidocaine  1 patch Transdermal Q24H    lidocaine  1 patch Transdermal Q24H   rosuvastatin  10 mg Oral QHS   sodium chloride flush  10-40 mL Intracatheter Q12H   sodium chloride flush  3 mL Intravenous Q12H   sodium chloride flush  3 mL Intravenous Q12H    Infusions:  sodium chloride 250 mL (01/23/23 1235)   ferric gluconate (FERRLECIT) IVPB 250 mg (01/29/23 1220)    PRN Medications: sodium chloride, acetaminophen, alum & mag hydroxide-simeth, hydrocortisone cream, ipratropium-albuterol, nitroGLYCERIN, ondansetron (ZOFRAN) IV, mouth rinse, oxyCODONE, simethicone, sodium chloride flush, sodium chloride flush, traMADol  Assessment/Plan   1. Cardiogenic shock/acute on chronic systolic CHF: Echo this admission with EF 20-25%, mild LV dilation, mild RV dysfunction, severe MR with restricted posterior leaflet.  Echo in 2/23 with EF 50-55%.  Patient has history of nonischemic cardiomyopathy (?PVC-mediated) that improved.  She reports 2 months of dyspnea then had lateral STEMI 1/30 with chest pain.  PLOM occlusion not thought to explain the extent of her cardiomyopathy, so suspect EF may have been falling prior to this event (symptomatic at least 2 months).  Suspect mixed ischemic/nonischemic cardiomyopathy.  She initially had IABP for cardiogenic shock, now removed due to right leg ischemia. Was weaned off pressors/inotropes but Milrinone restarted 2/11 for low Co-ox and rising SCr. Off milrinone.  - Agree with lasix 40 mg IV BID, follow UOP. Weight up 5 lbs.  - Would Jardiance as she is getting more active but given groin infection will hold off - additional GDMT initially limited by hypotension and AKI. BP and Scr improving. Tolerating midodrine wean.  - Stable off midodrine.  - S/p  DDD ICD. Appreciate EP  2. Mitral regurgitation: severe MR with restricted posterior leaflet on initial echo, suspect infarct-related MR with PLOM occlusion. TEE was later done showing moderate MR.  3. CAD: Lateral STEMI with occluded pLOM, treated with  DES.   - Continue ASA 81 - Switched from Brilinta to plavix.  - Continue statin.  4. 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  - Continue amio 200 mg twice a day.   5. Right femoral PSA: s/p thrombin injection by Dr. Carlis Abbott. 2/4 stat u/s with recurrent PSA with active bleeding into PSA. S/p PSA repair with VAC placement. 2/15 VAC change with sartorious muscle flap. - vascular following. Plan for OR tomorrow for wash out and VAC change.   6. AKI on CKD stage 3b: Baseline 1.3-1.5.  Creatinine trended up to 2.06. Milrinone restarted for low output. SCr plateued 1.9. Back to baseline. 1.47 today 7.  Acute blood loss anemia:  - Check CBC - monitor closely  8. Atrial fibrillation: Paroxysmal.  - Per EP hold a/c x 1 wk post ICD  placement. No heparin  - Remains in SR. - Amiodarone 200 mg bid  9. VT Arrest: 2/7, DCCV x 1 with CPR, ROSC after 2 minutes.  Polymorphic VT, appeared bradycardia-mediated with intermittent junctional rhythm. - s/p DDD ICD on 2/12 - Continue amiodarone.   Earnie Larsson, AGACNP-BC  01/30/2023  Patient seen with NP, agree with the above note.   Walked today.  No complaints.   General: NAD Neck: JVP 8 cm, no thyromegaly or thyroid nodule.  Lungs: Clear to auscultation bilaterally with normal respiratory effort. CV: Nondisplaced PMI.  Heart regular S1/S2, no S3/S4, no murmur.  1+ ankle edema.  Abdomen: Soft, nontender, no hepatosplenomegaly, no distention.  Skin: Intact without lesions or rashes.  Neurologic: Alert and oriented x 3.  Psych: Normal affect. Extremities: No clubbing or cyanosis.  HEENT: Normal.   Mild volume overload, agree with continuing IV Lasix.  Creatinine stable 1.47.  Soft BP and CKD limit GDMT.    Has chronic wound at site of PSA repair right groin.  Wound cultures growing Morganella, Enterococcus, Pseudomonas, Klebsiella. -  start IV vancomycin and ceftazidime today, plan for wound washout in OR tomorrow    Paroxysmal atrial fibrillation, can restart Eliquis when stable post-OR.   Loralie Champagne 01/30/2023 12:51 PM

## 2023-01-30 NOTE — Progress Notes (Addendum)
  Progress Note    01/30/2023 7:58 AM 4 Days Post-Op  Subjective:  no major complaints   Vitals:   01/30/23 0618 01/30/23 0730  BP: 128/71 (!) 127/56  Pulse: 68 70  Resp: 18 16  Temp: 98.4 F (36.9 C) 98.2 F (36.8 C)  SpO2: 100% 99%   Physical Exam: Cardiac:  regular Lungs:  non labored Incisions:  Right groin wound VAC with not great seal however is holding suction Extremities: well perfused and warm with palpable pulses Abdomen:  obese, soft Neurologic: alert and oriented  CBC    Component Value Date/Time   WBC 9.7 01/30/2023 0712   RBC 2.84 (L) 01/30/2023 0712   HGB 8.5 (L) 01/30/2023 0712   HGB 14.2 10/09/2020 0935   HCT 28.8 (L) 01/30/2023 0712   HCT 44.9 10/09/2020 0935   PLT 355 01/30/2023 0712   PLT 379 10/09/2020 0935   MCV 101.4 (H) 01/30/2023 0712   MCV 91 10/09/2020 0935   MCH 29.9 01/30/2023 0712   MCHC 29.5 (L) 01/30/2023 0712   RDW 19.3 (H) 01/30/2023 0712   RDW 13.4 10/09/2020 0935   LYMPHSABS 0.9 01/15/2023 0950   LYMPHSABS 1.1 10/09/2020 0935   MONOABS 1.4 (H) 01/15/2023 0950   EOSABS 0.6 (H) 01/15/2023 0950   EOSABS 0.2 10/09/2020 0935   BASOSABS 0.1 01/15/2023 0950   BASOSABS 0.1 10/09/2020 0935    BMET    Component Value Date/Time   NA 141 01/29/2023 0759   NA 145 (H) 07/07/2022 1007   K 4.8 01/29/2023 0759   CL 104 01/29/2023 0759   CO2 26 01/29/2023 0759   GLUCOSE 125 (H) 01/29/2023 0759   BUN 41 (H) 01/29/2023 0759   BUN 24 07/07/2022 1007   CREATININE 1.52 (H) 01/29/2023 0759   CREATININE 1.28 (H) 01/14/2022 1551   CALCIUM 9.3 01/29/2023 0759   GFRNONAA 34 (L) 01/29/2023 0759   GFRAA 49 (L) 10/09/2020 0933    INR    Component Value Date/Time   INR 1.4 (H) 01/10/2023 2145     Intake/Output Summary (Last 24 hours) at 01/30/2023 0758 Last data filed at 01/30/2023 0012 Gross per 24 hour  Intake 370.08 ml  Output 500 ml  Net -129.92 ml     Assessment/Plan:  82 y.o. female is s/p R groin washout, sartorius  muscle flap and placement of vac  4 Days Post-Op   Lower extremities are well perfused and warm Continue right groin wound VAC with good seal Next VAC change will be on 2/22 likely in OR for washout and VAC change Encourage continued po intake to help with wound healing Mobilize as tolerated   Karoline Caldwell, PA-C [facility] [phone] 01/30/2023 7:58 AM  VASCULAR STAFF ADDENDUM: I have independently interviewed and examined the patient. I agree with the above.  VAC to suction. Canister needs to be changed.  Discussed abx regimen with Dr. Jacinta Shoe - growing morganella and enterococcus.  Plan for washout tomorrow with Vac replacement in OR. Please make NPO midnight    Cassandria Santee, MD [facility] Phone Number: [phone] 01/30/2023 9:03 AM

## 2023-01-30 NOTE — Progress Notes (Signed)
Pharmacy Antibiotic Note  Natasha Woodard is a 82 y.o. female admitted on 01/10/2023 with  polymicrobial wound infection .  Pharmacy has been consulted for vancomycin + ceftazidime dosing.  Plan: Vancomycin 1500 mg IV x 1 , then 1g q 48 hrs Ceftazidime 2g IV q 24 hrs F/u cultures, renal function and clinical course.  Height: 5' 2"$  (157.5 cm) Weight: 78.9 kg (173 lb 15.1 oz) IBW/kg (Calculated) : 50.1  Temp (24hrs), Avg:98.3 F (36.8 C), Min:98.2 F (36.8 C), Max:98.5 F (36.9 C)  Recent Labs  Lab 01/24/23 0634 01/24/23 1150 01/25/23 0633 01/26/23 0441 01/27/23 0244 01/28/23 0042 01/29/23 0759 01/30/23 0712  WBC 11.2*  --  9.7 9.0  --   --  9.9 9.7  CREATININE 2.06*   < > 1.85* 1.80* 1.89* 1.66* 1.52* 1.47*   < > = values in this interval not displayed.    Estimated Creatinine Clearance: 28.7 mL/min (A) (by C-G formula based on SCr of 1.47 mg/dL (H)).    Allergies  Allergen Reactions   Ezetimibe-Simvastatin Other (See Comments)    REACTION: Muscle aches (side effect)   Lipitor [Atorvastatin] Other (See Comments)    Leg weakness    Claritin [Loratadine]     Possible cause of nightmares.     Spironolactone     Elevated potassium/creatinine   Tamiflu [Oseltamivir Phosphate] Other (See Comments)    nightmares   Pravastatin Sodium Other (See Comments)    REACTION: Muscle aches (side effect)   Sulfonamide Derivatives Nausea And Vomiting    Antimicrobials this admission:  Cefazolin 1/30, 2/13, 2/18 Ceftazidime 2/19 >  Vancomycin 2/19 >   Dose adjustments this admission:   Microbiology results:  2/15 Groin wound > morganella, entercoccus, pseudomonas, klebsiella - mostly sensitive 2/9 MRSA PCR neg  Thank you for allowing pharmacy to be a part of this patient's care.   [place] Crane, Roylene Reason, BCCP Clinical Pharmacist  01/30/2023 3:55 PM   [facility] pharmacy phone numbers are listed on [email or web address]

## 2023-01-30 NOTE — Progress Notes (Signed)
PROGRESS NOTE    Natasha Woodard  [id number] DOB: 01-Dec-1941 DOA: 01/10/2023 PCP: Tonia Ghent, MD  82 y/o female w/ prior h/o nonischemic CM that was PVC mediated, EF recovered w/ suppression of PVCs. Now admitted for acute lateral wall MI 2/2 distal LCx occlusion c/b severe ischemic MR and acute systolic heart failure w/ low output, CI 2.1 on RHC. Required placement of IABP, but later removed given concerns for developing limb ischemia=>found to have Right femoral PSA then ultimate rupture s/p emergent repair. VT arrest 2/8. ICD placed 2/12.   1/31 Underwent thrombin injection to right femoral PSA . Hgb drifting down 10.3>8.3>6.8  2/1 Started on amio drip to suppress PVCs. Got dose of IV lasix x1. CVP low.  2/4 Ruptured PSA. Returned to OR for repair. VAC in place 2/5 Midodrine 5 mg three times daily added.  Back in A fib.  2/7 TEE with mod MR.  2/8 VT arrest--> shock x1 ROSC after 2 minutes. Given lidocaine and amio was increased to 60 mg.  2/9 Transvenous pacemaker placed for overdrive pacing with PMVT.  2/11 Milrinone restarted, Co-ox 49% 2/12 PICC removed. S/p [employer] Sci ICD  2/14 Milrinone stopped . Amio drip stopped.  2/15 Returned to OR for washout and VAC change with sartorious muscle flap.  2/17: Transferred from heart failure team to [facility] service 2/18, Lasix dose increased, weight trending up 2/19: Weight trending up further, restarted IV Lasix 40 Mg twice daily  Subjective: -Going back to the OR for wound washout tomorrow  Assessment and Plan:  Acute on chronic systolic CHF Cardiogenic shock Severe MR with restricted posterior leaflet -Echo now w/ EF 20-25%, mildly reduced RV function, severe MR -TEE confirmed severe MR -Previously echo 2/23 noted EF of 50-55% -Admitted with lateral STEMI on 1/30, PL OM occlusion not sufficient to explain extent of cardiomyopathy per cards, suspected to be mixed cardiomyopathy -Developed cardiogenic shock, initially required  balloon pump, then removed for right leg ischemia -Weaned off inotropes -Was then restarted on milrinone 2/11 for low output, low Co-ox with rising creatinine -Now off milrinone, changed to IV Lasix, weight trending up -Weaned off midodrine,  -s/p DDD ICD per EP, resume anticoag 2/19, hold until wound washout/debridement tomorrow -Foley removed, CIR to reassess  Right femoral pseudoaneurysm Right groin wound, secondary infection -Following balloon pump, -s/p thrombin injection by Dr. Carlis Abbott. 2/4 stat u/s with recurrent PSA with active bleeding into PSA. S/p PSA repair with VAC placement.  2/15 VAC change with sartorious muscle flap.  -Per vascular continue right groin wound VAC for 1 week -Wound cultures polymicrobial growing Morganella, Enterococcus, Pseudomonas, Klebsiella, start IV vancomycin and Fortaz today, plan for wound washout in OR tomorrow  Lateral STEMI  CAD Cath noted distal occlusion of the circumflex marginal vessel. -treated with DES.   -Continue aspirin Plavix and statin  AKI on CKD stage 3b:  -Baseline 1.3-1.5.  -Creatinine peaked at 2, milrinone was restarted, now off, now improving down to 1.5 today   Paroxysmal atrial fibrillation PVCs -On amiodarone, continued -Per EP hold anticoagulation X 1 week post ICD placement, resume 2/19  Chronic anemia -Secondary to blood loss and iron deficiency -Anemia panel 2/24 with severe iron deficiency, given IV iron 2/18  VT Arrest: 2/7 -DCCV x 1 with CPR, ROSC after 2 minutes.  Polymorphic VT, appeared bradycardia-mediated with intermittent junctional rhythm. - s/p DDD ICD on 2/12 - Continue amiodarone.    DVT prophylaxis: Lovenox Code Status: Full code Family Communication: Son at bedside  Disposition Plan: Possibly CIR, to be determined, when stable for discharge from vascular standpoint  Antimicrobials:    Objective: Vitals:   01/29/23 1605 01/29/23 2059 01/30/23 0618 01/30/23 0730  BP: 115/60 137/63 128/71  (!) 127/56  Pulse: 64 69 68 70  Resp: 18 (!) 22 18 16  $ Temp: 98.2 F (36.8 C) 98.5 F (36.9 C) 98.4 F (36.9 C) 98.2 F (36.8 C)  TempSrc: Oral Oral Oral Oral  SpO2: 99% 94% 100% 99%  Weight:      Height:        Intake/Output Summary (Last 24 hours) at 01/30/2023 1223 Last data filed at 01/30/2023 0012 Gross per 24 hour  Intake 370.08 ml  Output 50 ml  Net 320.08 ml   Filed Weights   01/27/23 0500 01/28/23 [id number] 01/29/23 0327  Weight: 76.5 kg 76.5 kg 78.9 kg    Examination:  General exam: Chronically ill female sitting up in recliner, AAOx3 HEENT: Unable to assess JVD CVS: S1-S2, regular rhythm Lungs: Clear anteriorly Abdomen: Soft, nontender, bowel sounds present Extremities 1+ edema, right groin wound VAC  Skin with significant ichthyosis, scaling Psychiatry:  Mood & affect appropriate.     Data Reviewed:   CBC: Recent Labs  Lab 01/24/23 0634 01/25/23 0633 01/26/23 0441 01/29/23 0759 01/30/23 0712  WBC 11.2* 9.7 9.0 9.9 9.7  HGB 7.9* 8.2* 8.4* 8.7* 8.5*  HCT 25.0* 26.5* 26.9* 28.5* 28.8*  MCV 95.1 95.7 95.4 98.3 101.4*  PLT 518* 475* 393 323 [id number]   Basic Metabolic Panel: Recent Labs  Lab 01/26/23 0441 01/27/23 0244 01/28/23 0042 01/29/23 0759 01/30/23 0712  NA 139 137 139 141 143  K 4.3 4.8 4.7 4.8 5.1  CL 101 102 101 104 101  CO2 26 24 27 26 30  $ GLUCOSE 100* 160* 206* 125* 127*  BUN 38* 44* 45* 41* 37*  CREATININE 1.80* 1.89* 1.66* 1.52* 1.47*  CALCIUM 8.9 9.0 9.1 9.3 9.6  MG 2.5* 2.3 2.3 2.4 2.1   GFR: Estimated Creatinine Clearance: 28.7 mL/min (A) (by C-G formula based on SCr of 1.47 mg/dL (H)). Liver Function Tests: No results for input(s): "AST", "ALT", "ALKPHOS", "BILITOT", "PROT", "ALBUMIN" in the last 168 hours. No results for input(s): "LIPASE", "AMYLASE" in the last 168 hours. No results for input(s): "AMMONIA" in the last 168 hours. Coagulation Profile: No results for input(s): "INR", "PROTIME" in the last 168 hours. Cardiac  Enzymes: No results for input(s): "CKTOTAL", "CKMB", "CKMBINDEX", "TROPONINI" in the last 168 hours. BNP (last 3 results) Recent Labs    06/09/22 1506  PROBNP 4,638*   HbA1C: No results for input(s): "HGBA1C" in the last 72 hours. CBG: Recent Labs  Lab 01/29/23 1224 01/29/23 1606 01/29/23 2321 01/30/23 0735 01/30/23 1154  GLUCAP 195* 138* 215* 130* 144*   Lipid Profile: No results for input(s): "CHOL", "HDL", "LDLCALC", "TRIG", "CHOLHDL", "LDLDIRECT" in the last 72 hours. Thyroid Function Tests: No results for input(s): "TSH", "T4TOTAL", "FREET4", "T3FREE", "THYROIDAB" in the last 72 hours. Anemia Panel: No results for input(s): "VITAMINB12", "FOLATE", "FERRITIN", "TIBC", "IRON", "RETICCTPCT" in the last 72 hours. Urine analysis: No results found for: "COLORURINE", "APPEARANCEUR", "LABSPEC", "PHURINE", "GLUCOSEU", "HGBUR", "BILIRUBINUR", "KETONESUR", "PROTEINUR", "UROBILINOGEN", "NITRITE", "LEUKOCYTESUR" Sepsis Labs: @LABRCNTIP$ (procalcitonin:4,lacticidven:4)  ) Recent Results (from the past 240 hour(s))  Aerobic/Anaerobic Culture w Gram Stain (surgical/deep wound)     Status: None (Preliminary result)   Collection Time: 01/26/23 11:08 AM   Specimen: PATH Other; Tissue  Result Value Ref Range Status   Specimen Description GROIN  Final  Special Requests WOUND  Final   Gram Stain NO WBC SEEN NO ORGANISMS SEEN   Final   Culture   Final    MODERATE MORGANELLA MORGANII MODERATE ENTEROCOCCUS FAECALIS FEW PSEUDOMONAS AERUGINOSA FEW KLEBSIELLA PNEUMONIAE HOLDING FOR POSSIBLE ANAEROBE Performed at [facility], [place] [address], [place], [place] [address]    Report Status PENDING  Incomplete   Organism ID, Bacteria MORGANELLA MORGANII  Final   Organism ID, Bacteria ENTEROCOCCUS FAECALIS  Final   Organism ID, Bacteria PSEUDOMONAS AERUGINOSA  Final   Organism ID, Bacteria KLEBSIELLA PNEUMONIAE  Final      Susceptibility   Enterococcus faecalis - MIC*    AMPICILLIN  <=2 SENSITIVE Sensitive     VANCOMYCIN 2 SENSITIVE Sensitive     GENTAMICIN SYNERGY SENSITIVE Sensitive     * MODERATE ENTEROCOCCUS FAECALIS   Klebsiella pneumoniae - MIC*    AMPICILLIN >=32 RESISTANT Resistant     CEFEPIME <=0.12 SENSITIVE Sensitive     CEFTAZIDIME <=1 SENSITIVE Sensitive     CEFTRIAXONE <=0.25 SENSITIVE Sensitive     CIPROFLOXACIN <=0.25 SENSITIVE Sensitive     GENTAMICIN <=1 SENSITIVE Sensitive     IMIPENEM <=0.25 SENSITIVE Sensitive     TRIMETH/SULFA <=20 SENSITIVE Sensitive     AMPICILLIN/SULBACTAM 8 SENSITIVE Sensitive     PIP/TAZO <=4 SENSITIVE Sensitive     * FEW KLEBSIELLA PNEUMONIAE   Morganella morganii - MIC*    AMPICILLIN >=32 RESISTANT Resistant     CEFTAZIDIME <=1 SENSITIVE Sensitive     CIPROFLOXACIN <=0.25 SENSITIVE Sensitive     GENTAMICIN <=1 SENSITIVE Sensitive     IMIPENEM 2 SENSITIVE Sensitive     TRIMETH/SULFA <=20 SENSITIVE Sensitive     AMPICILLIN/SULBACTAM 16 INTERMEDIATE Intermediate     PIP/TAZO <=4 SENSITIVE Sensitive     * MODERATE MORGANELLA MORGANII   Pseudomonas aeruginosa - MIC*    CEFTAZIDIME 8 SENSITIVE Sensitive     CIPROFLOXACIN <=0.25 SENSITIVE Sensitive     GENTAMICIN 2 SENSITIVE Sensitive     IMIPENEM 1 SENSITIVE Sensitive     PIP/TAZO <=4 SENSITIVE Sensitive     CEFEPIME 1 SENSITIVE Sensitive     * FEW PSEUDOMONAS AERUGINOSA  Acid Fast Smear (AFB)     Status: None   Collection Time: 01/26/23 11:08 AM   Specimen: PATH Other; Tissue  Result Value Ref Range Status   AFB Specimen Processing Concentration  Final   Acid Fast Smear Negative  Final    Comment: (NOTE) Performed At: [facility] [place], [place] [id number] Rush Farmer MD [id number]    Source (AFB) GROIN  Final    Comment: WOUND Performed at [facility], [address] [place], [place] [address]      Radiology Studies: No results found.   Scheduled Meds:  acetaminophen  650 mg Oral Q6H   amiodarone  200 mg  Oral BID   [START ON 01/31/2023] aspirin  81 mg Oral Daily   Chlorhexidine Gluconate Cloth  6 each Topical Daily   [START ON 01/31/2023] clopidogrel  75 mg Oral Daily   enoxaparin (LOVENOX) injection  30 mg Subcutaneous Q24H   feeding supplement  237 mL Oral TID BM   fentaNYL (SUBLIMAZE) injection  50 mcg Intravenous Once   furosemide  40 mg Intravenous BID   insulin aspart  0-15 Units Subcutaneous TID WC   lidocaine  1 patch Transdermal Q24H   lidocaine  1 patch Transdermal Q24H   rosuvastatin  10 mg Oral  QHS   sodium chloride flush  10-40 mL Intracatheter Q12H   sodium chloride flush  3 mL Intravenous Q12H   sodium chloride flush  3 mL Intravenous Q12H   Continuous Infusions:  sodium chloride 250 mL (01/23/23 1235)   cefTAZidime (FORTAZ)  IV     ferric gluconate (FERRLECIT) IVPB 250 mg (01/30/23 1002)   vancomycin       LOS: 19 days    Time spent: 98mn    PDomenic Polite MD [facility]   01/30/2023, 12:23 PM

## 2023-01-31 ENCOUNTER — Encounter (HOSPITAL_COMMUNITY): Payer: Self-pay | Admitting: Internal Medicine

## 2023-01-31 ENCOUNTER — Encounter (HOSPITAL_COMMUNITY)
Admission: EM | Disposition: A | Discharge status: Rehabilitation Facility | Payer: Self-pay | Source: Home / Self Care | Attending: Cardiology

## 2023-01-31 ENCOUNTER — Inpatient Hospital Stay (HOSPITAL_COMMUNITY): Payer: Medicare Other | Admitting: Certified Registered"

## 2023-01-31 ENCOUNTER — Other Ambulatory Visit: Payer: Self-pay

## 2023-01-31 DIAGNOSIS — Z9989 Dependence on other enabling machines and devices: Secondary | ICD-10-CM

## 2023-01-31 DIAGNOSIS — S31103D Unspecified open wound of abdominal wall, right lower quadrant without penetration into peritoneal cavity, subsequent encounter: Secondary | ICD-10-CM

## 2023-01-31 DIAGNOSIS — I214 Non-ST elevation (NSTEMI) myocardial infarction: Secondary | ICD-10-CM | POA: Diagnosis not present

## 2023-01-31 DIAGNOSIS — Z87891 Personal history of nicotine dependence: Secondary | ICD-10-CM

## 2023-01-31 DIAGNOSIS — G4733 Obstructive sleep apnea (adult) (pediatric): Secondary | ICD-10-CM | POA: Diagnosis not present

## 2023-01-31 DIAGNOSIS — J449 Chronic obstructive pulmonary disease, unspecified: Secondary | ICD-10-CM

## 2023-01-31 DIAGNOSIS — I9789 Other postprocedural complications and disorders of the circulatory system, not elsewhere classified: Secondary | ICD-10-CM

## 2023-01-31 HISTORY — PX: INCISION AND DRAINAGE OF WOUND: SHX1803

## 2023-01-31 LAB — CBC
HCT: 29.2 % — ABNORMAL LOW (ref 36.0–46.0)
Hemoglobin: 8.7 g/dL — ABNORMAL LOW (ref 12.0–15.0)
MCH: 29.9 pg (ref 26.0–34.0)
MCHC: 29.8 g/dL — ABNORMAL LOW (ref 30.0–36.0)
MCV: 100.3 fL — ABNORMAL HIGH (ref 80.0–100.0)
Platelets: 353 10*3/uL (ref 150–400)
RBC: 2.91 MIL/uL — ABNORMAL LOW (ref 3.87–5.11)
RDW: 19.5 % — ABNORMAL HIGH (ref 11.5–15.5)
WBC: 9.4 10*3/uL (ref 4.0–10.5)
nRBC: 1.1 % — ABNORMAL HIGH (ref 0.0–0.2)

## 2023-01-31 LAB — BASIC METABOLIC PANEL
Anion gap: 8 (ref 5–15)
BUN: 32 mg/dL — ABNORMAL HIGH (ref 8–23)
CO2: 30 mmol/L (ref 22–32)
Calcium: 9.2 mg/dL (ref 8.9–10.3)
Chloride: 103 mmol/L (ref 98–111)
Creatinine, Ser: 1.34 mg/dL — ABNORMAL HIGH (ref 0.44–1.00)
GFR, Estimated: 40 mL/min — ABNORMAL LOW (ref >60)
Glucose, Bld: 106 mg/dL — ABNORMAL HIGH (ref 70–99)
Potassium: 3.7 mmol/L (ref 3.5–5.1)
Sodium: 141 mmol/L (ref 135–145)

## 2023-01-31 LAB — GLUCOSE, CAPILLARY
Glucose-Capillary: 102 mg/dL — ABNORMAL HIGH (ref 70–99)
Glucose-Capillary: 101 mg/dL — ABNORMAL HIGH (ref 70–99)
Glucose-Capillary: 108 mg/dL — ABNORMAL HIGH (ref 70–99)
Glucose-Capillary: 104 mg/dL — ABNORMAL HIGH (ref 70–99)
Glucose-Capillary: 159 mg/dL — ABNORMAL HIGH (ref 70–99)
Glucose-Capillary: 139 mg/dL — ABNORMAL HIGH (ref 70–99)

## 2023-01-31 LAB — MAGNESIUM: Magnesium: 1.9 mg/dL (ref 1.7–2.4)

## 2023-01-31 SURGERY — IRRIGATION AND DEBRIDEMENT WOUND
Anesthesia: General | Site: Groin | Laterality: Right

## 2023-01-31 MED ORDER — PHENYLEPHRINE 80 MCG/ML (10ML) SYRINGE FOR IV PUSH (FOR BLOOD PRESSURE SUPPORT)
PREFILLED_SYRINGE | INTRAVENOUS | Status: DC | PRN
  Administered 2023-01-31: 80 ug via INTRAVENOUS
  Administered 2023-01-31 (×2): 160 ug via INTRAVENOUS

## 2023-01-31 MED ORDER — ONDANSETRON HCL 4 MG/2ML IJ SOLN
INTRAMUSCULAR | Status: DC | PRN
  Administered 2023-01-31: 4 mg via INTRAVENOUS

## 2023-01-31 MED ORDER — PROPOFOL 10 MG/ML IV BOLUS
INTRAVENOUS | Status: AC
  Filled 2023-01-31: qty 20

## 2023-01-31 MED ORDER — SODIUM CHLORIDE 0.9 % IV SOLN
250.0000 mg | Freq: Every day | INTRAVENOUS | Status: AC
  Administered 2023-01-31 – 2023-02-01 (×2): 250 mg via INTRAVENOUS
  Filled 2023-01-31 (×2): qty 20

## 2023-01-31 MED ORDER — ALBUMIN HUMAN 5 % IV SOLN: INTRAVENOUS | Status: DC | PRN

## 2023-01-31 MED ORDER — FENTANYL CITRATE (PF) 250 MCG/5ML IJ SOLN
INTRAMUSCULAR | Status: AC
  Filled 2023-01-31: qty 5

## 2023-01-31 MED ORDER — SODIUM CHLORIDE 0.9 % IV SOLN: INTRAVENOUS | Status: DC | PRN

## 2023-01-31 MED ORDER — CHLORHEXIDINE GLUCONATE 0.12 % MT SOLN
OROMUCOSAL | Status: AC
  Administered 2023-01-31: 15 mL via OROMUCOSAL
  Filled 2023-01-31: qty 15

## 2023-01-31 MED ORDER — ORAL CARE MOUTH RINSE: 15.0000 mL | Freq: Once | OROMUCOSAL | Status: AC

## 2023-01-31 MED ORDER — PHENYLEPHRINE HCL-NACL 20-0.9 MG/250ML-% IV SOLN
INTRAVENOUS | Status: DC | PRN
  Administered 2023-01-31: 25 ug/min via INTRAVENOUS

## 2023-01-31 MED ORDER — LIDOCAINE 2% (20 MG/ML) 5 ML SYRINGE
INTRAMUSCULAR | Status: DC | PRN
  Administered 2023-01-31: 60 mg via INTRAVENOUS

## 2023-01-31 MED ORDER — CHLORHEXIDINE GLUCONATE 0.12 % MT SOLN: 15.0000 mL | Freq: Once | OROMUCOSAL | Status: AC

## 2023-01-31 MED ORDER — ONDANSETRON HCL 4 MG/2ML IJ SOLN: 4.0000 mg | Freq: Once | INTRAMUSCULAR | Status: DC | PRN

## 2023-01-31 MED ORDER — PROPOFOL 10 MG/ML IV BOLUS
INTRAVENOUS | Status: DC | PRN
  Administered 2023-01-31: 50 mg via INTRAVENOUS

## 2023-01-31 MED ORDER — LACTATED RINGERS IV SOLN: INTRAVENOUS | Status: DC

## 2023-01-31 MED ORDER — FENTANYL CITRATE (PF) 250 MCG/5ML IJ SOLN
INTRAMUSCULAR | Status: DC | PRN
  Administered 2023-01-31: 50 ug via INTRAVENOUS
  Administered 2023-01-31 (×2): 25 ug via INTRAVENOUS

## 2023-01-31 MED ORDER — FENTANYL CITRATE (PF) 100 MCG/2ML IJ SOLN: 25.0000 ug | INTRAMUSCULAR | Status: DC | PRN

## 2023-01-31 MED ORDER — 0.9 % SODIUM CHLORIDE (POUR BTL) OPTIME
TOPICAL | Status: DC | PRN
  Administered 2023-01-31: 1000 mL

## 2023-01-31 SURGICAL SUPPLY — 49 items
APL SKNCLS STERI-STRIP NONHPOA (GAUZE/BANDAGES/DRESSINGS) ×2
BAG COUNTER SPONGE SURGICOUNT (BAG) ×1 IMPLANT
BAG SPNG CNTER NS LX DISP (BAG) ×1
BENZOIN TINCTURE PRP APPL 2/3 (GAUZE/BANDAGES/DRESSINGS) IMPLANT
BNDG ELASTIC 4X5.8 VLCR STR LF (GAUZE/BANDAGES/DRESSINGS) IMPLANT
BNDG ELASTIC 6X5.8 VLCR STR LF (GAUZE/BANDAGES/DRESSINGS) IMPLANT
BNDG GAUZE DERMACEA FLUFF 4 (GAUZE/BANDAGES/DRESSINGS) IMPLANT
BNDG GZE DERMACEA 4 6PLY (GAUZE/BANDAGES/DRESSINGS)
CANISTER SUCT 3000ML PPV (MISCELLANEOUS) ×1 IMPLANT
CANISTER WOUNDNEG PRESSURE 500 (CANNISTER) IMPLANT
CLIP VESOCCLUDE MED 6/CT (CLIP) ×1 IMPLANT
COVER SURGICAL LIGHT HANDLE (MISCELLANEOUS) ×1 IMPLANT
DRAPE HALF SHEET 40X57 (DRAPES) IMPLANT
DRAPE INCISE IOBAN 66X45 STRL (DRAPES) IMPLANT
DRAPE U-SHAPE 76X120 STRL (DRAPES) IMPLANT
DRESSING VERAFLO CLEANS CC MED (GAUZE/BANDAGES/DRESSINGS) IMPLANT
DRSG CUTIMED SORBACT 7X9 (GAUZE/BANDAGES/DRESSINGS) IMPLANT
DRSG VERAFLO CLEANSE CC MED (GAUZE/BANDAGES/DRESSINGS) ×1
ELECT REM PT RETURN 9FT ADLT (ELECTROSURGICAL) ×1
ELECTRODE REM PT RTRN 9FT ADLT (ELECTROSURGICAL) ×1 IMPLANT
GAUZE SPONGE 4X4 12PLY STRL (GAUZE/BANDAGES/DRESSINGS) ×1 IMPLANT
GAUZE XEROFORM 5X9 LF (GAUZE/BANDAGES/DRESSINGS) IMPLANT
GLOVE BIOGEL PI IND STRL 8 (GLOVE) ×1 IMPLANT
GOWN STRL REUS W/ TWL LRG LVL3 (GOWN DISPOSABLE) ×2 IMPLANT
GOWN STRL REUS W/TWL 2XL LVL3 (GOWN DISPOSABLE) ×2 IMPLANT
GOWN STRL REUS W/TWL LRG LVL3 (GOWN DISPOSABLE) ×2
HANDPIECE INTERPULSE COAX TIP (DISPOSABLE)
IV NS IRRIG 3000ML ARTHROMATIC (IV SOLUTION) ×1 IMPLANT
KIT BASIN OR (CUSTOM PROCEDURE TRAY) ×1 IMPLANT
KIT TURNOVER KIT B (KITS) ×1 IMPLANT
NS IRRIG 1000ML POUR BTL (IV SOLUTION) ×1 IMPLANT
PACK CV ACCESS (CUSTOM PROCEDURE TRAY) IMPLANT
PACK GENERAL/GYN (CUSTOM PROCEDURE TRAY) IMPLANT
PACK UNIVERSAL I (CUSTOM PROCEDURE TRAY) ×1 IMPLANT
PAD ARMBOARD 7.5X6 YLW CONV (MISCELLANEOUS) ×2 IMPLANT
PAD NEG PRESSURE SENSATRAC (MISCELLANEOUS) IMPLANT
POWDER MYRIAD MORCELLS 1000MG (Miscellaneous) IMPLANT
PULSAVAC PLUS IRRIG FAN TIP (DISPOSABLE)
SET HNDPC FAN SPRY TIP SCT (DISPOSABLE) IMPLANT
SURGILUBE 2OZ TUBE FLIPTOP (MISCELLANEOUS) IMPLANT
SUT ETHILON 3 0 PS 1 (SUTURE) IMPLANT
SUT MNCRL AB 4-0 PS2 18 (SUTURE) IMPLANT
SUT VIC AB 2-0 CTX 36 (SUTURE) IMPLANT
SUT VIC AB 3-0 SH 27 (SUTURE)
SUT VIC AB 3-0 SH 27X BRD (SUTURE) IMPLANT
SUT VICRYL 4-0 PS2 18IN ABS (SUTURE) IMPLANT
TIP FAN IRRIG PULSAVAC PLUS (DISPOSABLE) IMPLANT
TOWEL GREEN STERILE (TOWEL DISPOSABLE) ×1 IMPLANT
WATER STERILE IRR 1000ML POUR (IV SOLUTION) ×1 IMPLANT

## 2023-01-31 NOTE — Anesthesia Procedure Notes (Signed)
Procedure Name: LMA Insertion Date/Time: 01/31/2023 10:00 AM  Performed by: Griffin Dakin, CRNAPre-anesthesia Checklist: Patient identified, Suction available, Patient being monitored, Emergency Drugs available and Timeout performed Patient Re-evaluated:Patient Re-evaluated prior to induction Oxygen Delivery Method: Circle system utilized Preoxygenation: Pre-oxygenation with 100% oxygen Induction Type: IV induction LMA: LMA inserted LMA Size: 4.0 Placement Confirmation: positive ETCO2 and breath sounds checked- equal and bilateral Tube secured with: Tape Dental Injury: Teeth and Oropharynx as per pre-operative assessment

## 2023-01-31 NOTE — Progress Notes (Signed)
Pt refused CPAP for night time use.

## 2023-01-31 NOTE — Anesthesia Postprocedure Evaluation (Signed)
Anesthesia Post Note  Patient: Natasha Woodard  Procedure(s) Performed: IRRIGATION AND DEBRIDEMENT RIGHT GROIN WOUND AND WOUND VAC CHANGE (Right: Groin)     Patient location during evaluation: PACU Anesthesia Type: General Level of consciousness: awake and alert Pain management: pain level controlled Vital Signs Assessment: post-procedure vital signs reviewed and stable Respiratory status: spontaneous breathing, nonlabored ventilation, respiratory function stable and patient connected to nasal cannula oxygen Cardiovascular status: blood pressure returned to baseline and stable Postop Assessment: no apparent nausea or vomiting Anesthetic complications: no   No notable events documented.  Last Vitals:  Vitals:   01/31/23 1120 01/31/23 1135  BP: 130/66 126/70  Pulse:  70  Resp: 17 19  Temp:  36.5 C  SpO2: 100% 98%    Last Pain:  Vitals:   01/31/23 1135  TempSrc:   PainSc: [place]

## 2023-01-31 NOTE — Progress Notes (Signed)
PROGRESS NOTE    Natasha Woodard  [id number] DOB: [birth year] DOA: 01/10/2023 PCP: Tonia Ghent, MD  82 y/o female w/ prior h/o nonischemic CM that was PVC mediated, EF recovered w/ suppression of PVCs. Now admitted for acute lateral wall MI 2/2 distal LCx occlusion c/b severe ischemic MR and acute systolic heart failure w/ low output, CI 2.1 on RHC. Required placement of IABP, but later removed given concerns for developing limb ischemia=>found to have Right femoral PSA then ultimate rupture s/p emergent repair. VT arrest 2/8. ICD placed 2/12.   1/31 Underwent thrombin injection to right femoral PSA . Hgb drifting down 10.3>8.3>6.8  2/1 Started on amio drip to suppress PVCs. Got dose of IV lasix x1. CVP low.  2/4 Ruptured PSA. Returned to OR for repair. VAC in place 2/5 Midodrine 5 mg three times daily added.  Back in A fib.  2/7 TEE with mod MR.  2/8 VT arrest--> shock x1 ROSC after 2 minutes. Given lidocaine and amio was increased to 60 mg.  2/9 Transvenous pacemaker placed for overdrive pacing with PMVT.  2/11 Milrinone restarted, Co-ox 49% 2/12 PICC removed. S/p [employer] ICD  2/14 Milrinone stopped . Amio drip stopped.  2/15 Returned to OR for washout and VAC change with sartorious muscle flap.  2/17: Transferred from heart failure team to [facility] service 2/18, Lasix dose increased, weight trending up 2/19: Weight trending up further, restarted IV Lasix 40 Mg twice daily 2/20: Back to the OR for right groin washout, debridement, wound VAC replacement  Subjective: -Just back from the OR, sleeping with multiple blankets on  Assessment and Plan:  Acute on chronic systolic CHF Cardiogenic shock Severe MR with restricted posterior leaflet -Echo now w/ EF 20-25%, mildly reduced RV function, severe MR -TEE confirmed severe MR -Previously echo 2/23 noted EF of 50-55% -Admitted with lateral STEMI on 1/30, PL OM occlusion not sufficient to explain extent of cardiomyopathy per  cards, suspected to be mixed cardiomyopathy -Developed cardiogenic shock, initially required balloon pump, then removed for right leg ischemia -Weaned off inotropes -Was then restarted on milrinone 2/11 for low output, low Co-ox with rising creatinine -Now off milrinone, changed to IV Lasix yesterday, urine output not recorded, no weights from today, creatinine improving,  -Weaned off midodrine,  -s/p DDD ICD per EP, resume anticoag 2/19, restart tomorrow considering OR today -Foley removed, CIR to reassess  Right femoral pseudoaneurysm Right groin wound, secondary infection -Following balloon pump, -s/p thrombin injection by Dr. Carlis Abbott. 2/4 stat u/s with recurrent PSA with active bleeding into PSA. S/p PSA repair with VAC placement.  2/15 VAC change with sartorious muscle flap.  -Per vascular continue right groin wound VAC for 1 week -Wound cultures polymicrobial growing moderate Morganella and Enterococcus with few Pseudomonas, started on broad-spectrum antibiotics yesterday, will change to Zosyn to cover, washout in OR today, continue antibiotics for another 1 week, d/w Vascular, plan to change VAC in OR next week  Lateral STEMI  CAD Cath noted distal occlusion of the circumflex marginal vessel. -treated with DES.   -Continue aspirin Plavix and statin  AKI on CKD stage 3b:  -Baseline 1.3-1.5.  -Creatinine peaked at 2, milrinone was restarted, now off, now improving down to 1.5 today   Paroxysmal atrial fibrillation PVCs -On amiodarone, continued -Per EP hold anticoagulation X 1 week post ICD placement, resume 2/19  Chronic anemia -Secondary to blood loss and iron deficiency -Anemia panel 2/24 with severe iron deficiency, given IV iron 2/18  VT  Arrest: 2/7 -DCCV x 1 with CPR, ROSC after 2 minutes.  Polymorphic VT, appeared bradycardia-mediated with intermittent junctional rhythm. - s/p DDD ICD on 2/12 - Continue amiodarone.    DVT prophylaxis: Lovenox Code Status: Full  code Family Communication: Son at bedside Disposition Plan: Possibly CIR, to be determined, prob next week after VAC change in OR  Antimicrobials:    Objective: Vitals:   01/31/23 1050 01/31/23 1105 01/31/23 1120 01/31/23 1135  BP: 131/72 136/64 130/66 126/70  Pulse: 69 70  70  Resp: 20 19 17 19  $ Temp: (!) 97.3 F (36.3 C)   97.7 F (36.5 C)  TempSrc:      SpO2: 97% 100% 100% 98%  Weight:      Height:        Intake/Output Summary (Last 24 hours) at 01/31/2023 1237 Last data filed at 01/31/2023 1027 Gross per 24 hour  Intake 901.55 ml  Output 300 ml  Net 601.55 ml   Filed Weights   01/27/23 0500 01/28/23 0613 01/29/23 0327  Weight: 76.5 kg 76.5 kg 78.9 kg    Examination:  General exam: Chronically ill female, sleeping, just back from OR HEENT: Unable to assess JVD CVS: S1-S2, regular rhythm Lungs: Clear anteriorly Abdomen: Soft, nontender, bowel sounds present Extremities 1+ edema, right groin wound VAC  Skin with significant ichthyosis, scaling Psychiatry:  unable to assess    Data Reviewed:   CBC: Recent Labs  Lab 01/25/23 0633 01/26/23 0441 01/29/23 0759 01/30/23 0712 01/31/23 0730  WBC 9.7 9.0 9.9 9.7 9.4  HGB 8.2* 8.4* 8.7* 8.5* 8.7*  HCT 26.5* 26.9* 28.5* 28.8* 29.2*  MCV 95.7 95.4 98.3 101.4* 100.3*  PLT 475* 393 323 355 0000000   Basic Metabolic Panel: Recent Labs  Lab 01/27/23 0244 01/28/23 0042 01/29/23 0759 01/30/23 0712 01/31/23 0730  NA 137 139 141 143 141  K 4.8 4.7 4.8 5.1 3.7  CL 102 101 104 101 103  CO2 24 27 26 30 30  $ GLUCOSE 160* 206* 125* 127* 106*  BUN 44* 45* 41* 37* 32*  CREATININE 1.89* 1.66* 1.52* 1.47* 1.34*  CALCIUM 9.0 9.1 9.3 9.6 9.2  MG 2.3 2.3 2.4 2.1 1.9   GFR: Estimated Creatinine Clearance: 31.5 mL/min (A) (by C-G formula based on SCr of 1.34 mg/dL (H)). Liver Function Tests: No results for input(s): "AST", "ALT", "ALKPHOS", "BILITOT", "PROT", "ALBUMIN" in the last 168 hours. No results for input(s):  "LIPASE", "AMYLASE" in the last 168 hours. No results for input(s): "AMMONIA" in the last 168 hours. Coagulation Profile: No results for input(s): "INR", "PROTIME" in the last 168 hours. Cardiac Enzymes: No results for input(s): "CKTOTAL", "CKMB", "CKMBINDEX", "TROPONINI" in the last 168 hours. BNP (last 3 results) Recent Labs    06/09/22 1506  PROBNP 4,638*   HbA1C: No results for input(s): "HGBA1C" in the last 72 hours. CBG: Recent Labs  Lab 01/30/23 2052 01/31/23 0755 01/31/23 0947 01/31/23 1052 01/31/23 1200  GLUCAP 159* 102* 101* 108* 104*   Lipid Profile: No results for input(s): "CHOL", "HDL", "LDLCALC", "TRIG", "CHOLHDL", "LDLDIRECT" in the last 72 hours. Thyroid Function Tests: No results for input(s): "TSH", "T4TOTAL", "FREET4", "T3FREE", "THYROIDAB" in the last 72 hours. Anemia Panel: No results for input(s): "VITAMINB12", "FOLATE", "FERRITIN", "TIBC", "IRON", "RETICCTPCT" in the last 72 hours. Urine analysis: No results found for: "COLORURINE", "APPEARANCEUR", "LABSPEC", "PHURINE", "GLUCOSEU", "HGBUR", "BILIRUBINUR", "KETONESUR", "PROTEINUR", "UROBILINOGEN", "NITRITE", "LEUKOCYTESUR" Sepsis Labs: @LABRCNTIP$ (procalcitonin:4,lacticidven:4)  ) Recent Results (from the past 240 hour(s))  Aerobic/Anaerobic Culture w Gram Stain (  surgical/deep wound)     Status: None   Collection Time: 01/26/23 11:08 AM   Specimen: PATH Other; Tissue  Result Value Ref Range Status   Specimen Description GROIN  Final   Special Requests WOUND  Final   Gram Stain   Final    NO WBC SEEN NO ORGANISMS SEEN Performed at [facility], [address] [place], [place] [address]    Culture   Final    MODERATE MORGANELLA MORGANII MODERATE ENTEROCOCCUS FAECALIS FEW PSEUDOMONAS AERUGINOSA FEW KLEBSIELLA PNEUMONIAE MIXED ANAEROBIC FLORA PRESENT.  CALL LAB IF FURTHER IID REQUIRED.    Report Status 01/30/2023 FINAL  Final   Organism ID, Bacteria MORGANELLA MORGANII  Final    Organism ID, Bacteria ENTEROCOCCUS FAECALIS  Final   Organism ID, Bacteria PSEUDOMONAS AERUGINOSA  Final   Organism ID, Bacteria KLEBSIELLA PNEUMONIAE  Final      Susceptibility   Enterococcus faecalis - MIC*    AMPICILLIN <=2 SENSITIVE Sensitive     VANCOMYCIN 2 SENSITIVE Sensitive     GENTAMICIN SYNERGY SENSITIVE Sensitive     * MODERATE ENTEROCOCCUS FAECALIS   Klebsiella pneumoniae - MIC*    AMPICILLIN >=32 RESISTANT Resistant     CEFEPIME <=0.12 SENSITIVE Sensitive     CEFTAZIDIME <=1 SENSITIVE Sensitive     CEFTRIAXONE <=0.25 SENSITIVE Sensitive     CIPROFLOXACIN <=0.25 SENSITIVE Sensitive     GENTAMICIN <=1 SENSITIVE Sensitive     IMIPENEM <=0.25 SENSITIVE Sensitive     TRIMETH/SULFA <=20 SENSITIVE Sensitive     AMPICILLIN/SULBACTAM 8 SENSITIVE Sensitive     PIP/TAZO <=4 SENSITIVE Sensitive     * FEW KLEBSIELLA PNEUMONIAE   Morganella morganii - MIC*    AMPICILLIN >=32 RESISTANT Resistant     CEFTAZIDIME <=1 SENSITIVE Sensitive     CIPROFLOXACIN <=0.25 SENSITIVE Sensitive     GENTAMICIN <=1 SENSITIVE Sensitive     IMIPENEM 2 SENSITIVE Sensitive     TRIMETH/SULFA <=20 SENSITIVE Sensitive     AMPICILLIN/SULBACTAM 16 INTERMEDIATE Intermediate     PIP/TAZO <=4 SENSITIVE Sensitive     * MODERATE MORGANELLA MORGANII   Pseudomonas aeruginosa - MIC*    CEFTAZIDIME 8 SENSITIVE Sensitive     CIPROFLOXACIN <=0.25 SENSITIVE Sensitive     GENTAMICIN 2 SENSITIVE Sensitive     IMIPENEM 1 SENSITIVE Sensitive     PIP/TAZO <=4 SENSITIVE Sensitive     CEFEPIME 1 SENSITIVE Sensitive     * FEW PSEUDOMONAS AERUGINOSA  Acid Fast Smear (AFB)     Status: None   Collection Time: 01/26/23 11:08 AM   Specimen: PATH Other; Tissue  Result Value Ref Range Status   AFB Specimen Processing Concentration  Final   Acid Fast Smear Negative  Final    Comment: (NOTE) Performed At: [facility] [place], [place] [id number] Rush Farmer MD [id number]    Source (AFB) GROIN   Final    Comment: WOUND Performed at [facility], [address], [place], [place] [address]      Radiology Studies: No results found.   Scheduled Meds:  acetaminophen  650 mg Oral Q6H   amiodarone  200 mg Oral BID   aspirin  81 mg Oral Daily   Chlorhexidine Gluconate Cloth  6 each Topical Daily   clopidogrel  75 mg Oral Daily   enoxaparin (LOVENOX) injection  30 mg Subcutaneous Q24H   feeding supplement  237 mL Oral TID BM   fentaNYL (SUBLIMAZE) injection  50 mcg Intravenous  Once   furosemide  40 mg Intravenous BID   insulin aspart  0-15 Units Subcutaneous TID WC   lidocaine  1 patch Transdermal Q24H   lidocaine  1 patch Transdermal Q24H   rosuvastatin  10 mg Oral QHS   sodium chloride flush  10-40 mL Intracatheter Q12H   sodium chloride flush  3 mL Intravenous Q12H   sodium chloride flush  3 mL Intravenous Q12H   Continuous Infusions:  sodium chloride 250 mL (01/23/23 1235)   ferric gluconate (FERRLECIT) IVPB 250 mg (01/31/23 1215)   piperacillin-tazobactam (ZOSYN)  IV 3.375 g (01/31/23 0636)   [START ON 02/01/2023] vancomycin       LOS: 20 days    Time spent: 45mn    PDomenic Polite MD [facility]   01/31/2023, 12:37 PM

## 2023-01-31 NOTE — Progress Notes (Signed)
PT Cancellation Note  Patient Details Name: Natasha Woodard MRN: [id number] DOB: [birth year]   Cancelled Treatment:    Reason Eval/Treat Not Completed: Patient at procedure or test/unavailable.  Has vascular surgery today, will retry when pt is able to be seen.   Ramond Dial 01/31/2023, 10:59 AM  Mee Hives, PT PhD Acute Rehab Dept. Number: [place] and [place]

## 2023-01-31 NOTE — Transfer of Care (Signed)
Immediate Anesthesia Transfer of Care Note  Patient: Natasha Woodard  Procedure(s) Performed: IRRIGATION AND DEBRIDEMENT RIGHT GROIN WOUND AND WOUND VAC CHANGE (Right: Groin)  Patient Location: PACU  Anesthesia Type:General  Level of Consciousness: awake, alert , and oriented  Airway & Oxygen Therapy: Patient Spontanous Breathing  Post-op Assessment: Report given to RN and Post -op Vital signs reviewed and stable  Post vital signs: Reviewed and stable  Last Vitals:  Vitals Value Taken Time  BP 131/72 01/31/23 1050  Temp    Pulse 71 01/31/23 1054  Resp 19 01/31/23 1054  SpO2 99 % 01/31/23 1054  Vitals shown include unvalidated device data.  Last Pain:  Vitals:   01/31/23 0828  TempSrc: Oral  PainSc: 8       Patients Stated Pain Goal: 0 ([id number])  Complications: No notable events documented.

## 2023-01-31 NOTE — Anesthesia Preprocedure Evaluation (Signed)
Anesthesia Evaluation  Patient identified by MRN, date of birth, ID band Patient awake    Reviewed: Allergy & Precautions, NPO status , Patient's Chart, lab work & pertinent test results, reviewed documented beta blocker date and time   Airway Mallampati: II  TM Distance: >3 FB Neck ROM: Full    Dental no notable dental hx.    Pulmonary sleep apnea and Continuous Positive Airway Pressure Ventilation , COPD,  COPD inhaler, former smoker   Pulmonary exam normal breath sounds clear to auscultation       Cardiovascular hypertension, Pt. on medications and Pt. on home beta blockers + CAD, + Past MI, + Peripheral Vascular Disease and +CHF (LVEF 20-25%)  Normal cardiovascular exam(-) dysrhythmias + pacemaker + Cardiac Defibrillator + Valvular Problems/Murmurs (severe MR) MR  Rhythm:Regular Rate:Normal   prior h/o nonischemic CM that was PVC mediated, EF recovered w/ suppression of PVCs. Now admitted for acute lateral wall MI 2/2 distal LCx occlusion c/b severe ischemic MR and acute systolic heart failure w/ low output, CI 2.1 on RHC. Required placement of IABP, but later removed given concerns for developing limb ischemia=>found to have Right femoral PSA then ultimate rupture s/p emergent repair. VT arrest 2/8. ICD placed 2/12.   Echo 01/18/23: 1. Left ventricular ejection fraction, by estimation, is 30 to 35%. The  left ventricle has moderately decreased function.   2. Right ventricular systolic function is normal. The right ventricular  size is normal.   3. Left atrial size was severely dilated. No left atrial/left atrial  appendage thrombus was detected.   4. Posterior leaflet is restricted. Moderate MR. The mitral valve is  abnormal. Moderate mitral valve regurgitation.   5. The aortic valve is tricuspid. There is mild calcification of the  aortic valve. Aortic valve regurgitation is trivial.    Per most recent notes:  Severe MR with  restricted posterior leaflet -Echo now w/ EF 20-25%, mildly reduced RV function, severe MR -TEE confirmed severe MR -Previously echo 2/23 noted EF of 50-55% -Admitted with lateral STEMI on 1/30, PL OM occlusion not sufficient to explain extent of cardiomyopathy per cards, suspected to be mixed cardiomyopathy -Developed cardiogenic shock, initially required balloon pump, then removed for right leg ischemia -Weaned off inotropes -Was then restarted on milrinone 2/11 for low output, low Co-ox with rising creatinine -Now off milrinone, changed to IV Lasix, weight trending up -Weaned off midodrine,  -s/p DDD ICD per EP, resume anticoag 2/19, hold until wound washout/debridement tomorrow    Neuro/Psych  negative psych ROS   GI/Hepatic hiatal hernia,GERD  Controlled,,  Endo/Other  diabetes, Well Controlled, Type 2, Oral Hypoglycemic Agents    Renal/GU Renal InsufficiencyRenal diseaseCr 1.3  negative genitourinary   Musculoskeletal negative musculoskeletal ROS (+)    Abdominal  (+) + obese  Peds  Hematology negative hematology ROS (+)   Anesthesia Other Findings   Reproductive/Obstetrics negative OB ROS                              Anesthesia Physical Anesthesia Plan  ASA: 4  Anesthesia Plan: General   Post-op Pain Management: Tylenol PO (pre-op)*   Induction: Intravenous  PONV Risk Score and Plan: Ondansetron  Airway Management Planned: LMA  Additional Equipment: ClearSight  Intra-op Plan:   Post-operative Plan: Extubation in OR and Possible Post-op intubation/ventilation  Informed Consent: I have reviewed the patients History and Physical, chart, labs and discussed the procedure including the risks, benefits  and alternatives for the proposed anesthesia with the patient or authorized representative who has indicated his/her understanding and acceptance.     Dental advisory given  Plan Discussed with: CRNA  Anesthesia Plan Comments:  (Very high risk of complications given fragile cardiovascular state )         Anesthesia Quick Evaluation

## 2023-01-31 NOTE — Progress Notes (Signed)
  Progress Note    01/31/2023 9:20 AM Day of Surgery  Subjective:  no major complaints   Vitals:   01/31/23 0754 01/31/23 0828  BP: 130/67 135/67  Pulse: 70 70  Resp: 17 16  Temp: 98.4 F (36.9 C) 98.2 F (36.8 C)  SpO2: 100% 100%   Physical Exam: Cardiac:  regular Lungs:  non labored Incisions:  Right groin wound VAC with not great seal however is holding suction Extremities: well perfused and warm with palpable pulses Abdomen:  obese, soft Neurologic: alert and oriented  CBC    Component Value Date/Time   WBC 9.4 01/31/2023 0730   RBC 2.91 (L) 01/31/2023 0730   HGB 8.7 (L) 01/31/2023 0730   HGB 14.2 10/09/2020 0935   HCT 29.2 (L) 01/31/2023 0730   HCT 44.9 10/09/2020 0935   PLT 353 01/31/2023 0730   PLT 379 10/09/2020 0935   MCV 100.3 (H) 01/31/2023 0730   MCV 91 10/09/2020 0935   MCH 29.9 01/31/2023 0730   MCHC 29.8 (L) 01/31/2023 0730   RDW 19.5 (H) 01/31/2023 0730   RDW 13.4 10/09/2020 0935   LYMPHSABS 0.9 01/15/2023 0950   LYMPHSABS 1.1 10/09/2020 0935   MONOABS 1.4 (H) 01/15/2023 0950   EOSABS 0.6 (H) 01/15/2023 0950   EOSABS 0.2 10/09/2020 0935   BASOSABS 0.1 01/15/2023 0950   BASOSABS 0.1 10/09/2020 0935    BMET    Component Value Date/Time   NA 141 01/31/2023 0730   NA 145 (H) 07/07/2022 1007   K 3.7 01/31/2023 0730   CL 103 01/31/2023 0730   CO2 30 01/31/2023 0730   GLUCOSE 106 (H) 01/31/2023 0730   BUN 32 (H) 01/31/2023 0730   BUN 24 07/07/2022 1007   CREATININE 1.34 (H) 01/31/2023 0730   CREATININE 1.28 (H) 01/14/2022 1551   CALCIUM 9.2 01/31/2023 0730   GFRNONAA 40 (L) 01/31/2023 0730   GFRAA 49 (L) 10/09/2020 0933    INR    Component Value Date/Time   INR 1.4 (H) 01/10/2023 2145     Intake/Output Summary (Last 24 hours) at 01/31/2023 0920 Last data filed at 01/31/2023 0800 Gross per 24 hour  Intake 651.55 ml  Output 300 ml  Net 351.55 ml      Assessment/Plan:  81 y.o. female is s/p R groin washout, sartorius muscle  flap and placement of vac  Day of Surgery   Lower extremities are well perfused and warm Continue right groin wound VAC with good seal Next VAC change will be today.  Fluid in VAC is not SS, some murky discoloration.   Plan for washout with abx laden saline    Broadus John [facility] [phone] 01/31/2023 9:20 AM

## 2023-01-31 NOTE — Op Note (Signed)
    NAME: Natasha Woodard    MRN: [id number] DOB: 12/30/1940    DATE OF OPERATION: 01/31/2023  PREOP DIAGNOSIS:    Right groin wound  POSTOP DIAGNOSIS:    Same  PROCEDURE:    Right Groin washout, debridement, Myriad Morcell placement (1gram), VAC replacement  SURGEON: Broadus John  ASSIST: Luisa Dago, PA  ANESTHESIA: General   EBL: 62m  INDICATIONS:    CAHNIA BRUNEAUis a 82y.o. female status post repair of ruptured pseudoaneurysm of the right profunda status post Impella placement for acute coronary syndrome.  The patient has heart failure, poor nutrition, diabetes.  She has been relatively immobile, but has improved, after arrest x 2 requiring defibrillation.  [facility]  After discussing the risk and benefits, care elected to proceed.  FINDINGS:   Large right groin cavity measuring 12 x 4 x 3 cm-smaller than previous Healthier, granulating tissue in the wound bed, some devitalized muscle from previous hematoma that was present at the initial surgery from the pseudoaneurysm rupture.   TECHNIQUE:   Patient was brought to the OR laid in the supine position.  General anesthesia was induced and the patient was prepped and draped in standard fashion.   The case began with sharp debridement of devitalized tissues.  The wound bed appeared much healthier than previous with granulating tissue in the base.  Devitalized tissue was on the outer edges, as well as on the apical surface of the sartorius muscle flap.  Overall the flap appeared viable, and the devitalized tissue, was previously seen at the initial flap placement.  The wound bed was irrigated with copious amounts of antibiotic saline.  Once dried, myriad more cell powder was brought onto the field and 1000 mg of powder was placed in the wound bed.  At this point I elected to place a piece of bacteriostatic sorbac and customize vacuum-assisted dressing.  This was placed  in the wound bed in standard fashion and excellent seal confirmed.  The plan will be for the dressing remain in place for 1 week with OR VAC change.  CHarviand her family are aware the wound bed appears improved, however healing will be much slower than usual due to her comorbidities and poor nutrition.  JMacie Burows MD [facility] OF [facility]ATION:   01/31/2023

## 2023-02-01 ENCOUNTER — Encounter (HOSPITAL_COMMUNITY): Payer: Self-pay | Admitting: Vascular Surgery

## 2023-02-01 DIAGNOSIS — I214 Non-ST elevation (NSTEMI) myocardial infarction: Secondary | ICD-10-CM | POA: Diagnosis not present

## 2023-02-01 LAB — VITAMIN C: Vitamin C: 0.4 mg/dL (ref 0.4–2.0)

## 2023-02-01 LAB — GLUCOSE, CAPILLARY
Glucose-Capillary: 104 mg/dL — ABNORMAL HIGH (ref 70–99)
Glucose-Capillary: 188 mg/dL — ABNORMAL HIGH (ref 70–99)
Glucose-Capillary: 142 mg/dL — ABNORMAL HIGH (ref 70–99)
Glucose-Capillary: 120 mg/dL — ABNORMAL HIGH (ref 70–99)

## 2023-02-01 LAB — BASIC METABOLIC PANEL
Anion gap: 13 (ref 5–15)
BUN: 37 mg/dL — ABNORMAL HIGH (ref 8–23)
CO2: 26 mmol/L (ref 22–32)
Calcium: 9.2 mg/dL (ref 8.9–10.3)
Chloride: 103 mmol/L (ref 98–111)
Creatinine, Ser: 1.62 mg/dL — ABNORMAL HIGH (ref 0.44–1.00)
GFR, Estimated: 32 mL/min — ABNORMAL LOW (ref >60)
Glucose, Bld: 103 mg/dL — ABNORMAL HIGH (ref 70–99)
Potassium: 3.8 mmol/L (ref 3.5–5.1)
Sodium: 142 mmol/L (ref 135–145)

## 2023-02-01 LAB — VITAMIN A: Vitamin A (Retinoic Acid): 39.8 ug/dL (ref 22.0–69.5)

## 2023-02-01 LAB — MAGNESIUM: Magnesium: 2 mg/dL (ref 1.7–2.4)

## 2023-02-01 LAB — BRAIN NATRIURETIC PEPTIDE: B Natriuretic Peptide: 914.7 pg/mL — ABNORMAL HIGH (ref 0.0–100.0)

## 2023-02-01 MED ORDER — FUROSEMIDE 10 MG/ML IJ SOLN
80.0000 mg | Freq: Two times a day (BID) | INTRAMUSCULAR | Status: DC
  Administered 2023-02-01 – 2023-02-05 (×9): 80 mg via INTRAVENOUS
  Filled 2023-02-01 (×9): qty 8

## 2023-02-01 MED ORDER — MELATONIN 3 MG PO TABS
6.0000 mg | ORAL_TABLET | Freq: Every evening | ORAL | Status: AC | PRN
  Administered 2023-02-01 – 2023-02-04 (×4): 6 mg via ORAL
  Filled 2023-02-01 (×4): qty 2

## 2023-02-01 MED ORDER — FUROSEMIDE 10 MG/ML IJ SOLN
40.0000 mg | INTRAMUSCULAR | Status: AC
  Administered 2023-02-01: 40 mg via INTRAVENOUS
  Filled 2023-02-01: qty 4

## 2023-02-01 NOTE — Progress Notes (Signed)
OT Cancellation Note  Patient Details Name: Natasha Woodard MRN: [id number] DOB: 1941/03/18   Cancelled Treatment:    Reason Eval/Treat Not Completed: Fatigue/lethargy limiting ability to participate (Pt just returned to bed with nursing, requests pain meds, RN notified.) Continuing to follow.   Natasha Woodard 02/01/2023, 2:33 PM Cleta Alberts, OTR/L Acute Rehabilitation Services Office: [phone]

## 2023-02-01 NOTE — Progress Notes (Signed)
Pt refused CPAP for nighttime use.

## 2023-02-01 NOTE — Progress Notes (Signed)
Physical Therapy Treatment Patient Details Name: Natasha Woodard MRN: [id number] DOB: 01-Mar-1941 Today's Date: 02/01/2023   History of Present Illness 82 yo female admitted 1/30 with chest pain, NSTEMI s/p cardiac cath and IABP placed. Pt with bleeding and impaired circulation with IABP removed 1/31 and thrombin injection to pseudoaneurysm. 2/4 Rt groin pseudoaneurysm rupture s/p repair with placement of wound VAC. Pt with VT arrest on 2/8. ICD placed 2/12. Pt with rt groin washout and debridement and sartorius muscle flap with VAC placement on 2/15. PMhx: CAD, dilated cardiomyopathy, CKD, GERD, HFpEF, HTN, HLD, T2DM    PT Comments    Pt was received in supine and agreeable to session. Pt requested to use BSC at beginning of session. Pt was able to stand and perform gait trial with up to min A and dense cues for safety awareness. Pt was able to tolerate longer gait distance this session, however was limited by fatigue. Pt required increased assist with returning to supine due to fatigue. Pt continues to benefit from PT services to progress toward functional mobility goals.    Recommendations for follow up therapy are one component of a multi-disciplinary discharge planning process, led by the attending physician.  Recommendations may be updated based on patient status, additional functional criteria and insurance authorization.  Follow Up Recommendations  Acute inpatient rehab (3hours/day)     Assistance Recommended at Discharge Frequent or constant Supervision/Assistance  Patient can return home with the following A little help with walking and/or transfers;A little help with bathing/dressing/bathroom;Help with stairs or ramp for entrance;Assist for transportation;Assistance with cooking/housework   Equipment Recommendations  Rolling walker (2 wheels)    Recommendations for Other Services       Precautions / Restrictions Precautions Precautions: Fall;ICD/Pacemaker;Other  (comment) Precaution Comments: VAC rt groin Restrictions Weight Bearing Restrictions: No     Mobility  Bed Mobility Overal bed mobility: Needs Assistance Bed Mobility: Supine to Sit, Sit to Supine     Supine to sit: Min assist, HOB elevated Sit to supine: Mod assist, +2 for physical assistance, HOB elevated   General bed mobility comments: Min A for trunk elevation and mod A +2 for BLE elevation and trunk descent due to fatigue. Cues for hand placement and sequence    Transfers Overall transfer level: Needs assistance Equipment used: Rolling walker (2 wheels) Transfers: Sit to/from Stand Sit to Stand: Min assist           General transfer comment: From EOB to RW x2 and BSC to RW with min A for power up and cues for safe hand placement    Ambulation/Gait Ambulation/Gait assistance: Min guard, +2 safety/equipment Gait Distance (Feet): 140 Feet Assistive device: Rolling walker (2 wheels) Gait Pattern/deviations: Step-through pattern, Trunk flexed, Drifts right/left Gait velocity: decr     General Gait Details: Min guard for safety. Dense cues for upright posture and RW management to avoid obstacles. Pt required 2 standing recovery breaks.       Balance Overall balance assessment: Needs assistance Sitting-balance support: Bilateral upper extremity supported, Feet supported Sitting balance-Leahy Scale: Good Sitting balance - Comments: at EOB   Standing balance support: Bilateral upper extremity supported, During functional activity, Reliant on assistive device for balance Standing balance-Leahy Scale: Poor Standing balance comment: with RW support                            Cognition Arousal/Alertness: Awake/alert Behavior During Therapy: WFL for tasks assessed/performed Overall Cognitive Status:  Within Functional Limits for tasks assessed                                          Exercises      General Comments General comments  (skin integrity, edema, etc.): VSS      Pertinent Vitals/Pain Pain Assessment Pain Assessment: Faces Faces Pain Scale: Hurts little more Pain Location: "all over" due to arthritis Pain Descriptors / Indicators: Sore, Grimacing Pain Intervention(s): Limited activity within patient's tolerance, Monitored during session, Repositioned     PT Goals (current goals can now be found in the care plan section) Acute Rehab PT Goals Patient Stated Goal: return home PT Goal Formulation: With patient Time For Goal Achievement: 02/10/23 Potential to Achieve Goals: Fair Progress towards PT goals: Progressing toward goals    Frequency    Min 3X/week      PT Plan Current plan remains appropriate       AM-PAC PT "6 Clicks" Mobility   Outcome Measure  Help needed turning from your back to your side while in a flat bed without using bedrails?: A Lot Help needed moving from lying on your back to sitting on the side of a flat bed without using bedrails?: A Lot Help needed moving to and from a bed to a chair (including a wheelchair)?: A Little Help needed standing up from a chair using your arms (e.g., wheelchair or bedside chair)?: A Little Help needed to walk in hospital room?: A Little Help needed climbing 3-5 steps with a railing? : Total 6 Click Score: 14    End of Session Equipment Utilized During Treatment: Gait belt Activity Tolerance: Patient tolerated treatment well;Patient limited by fatigue Patient left: with call bell/phone within reach;with family/visitor present;in bed Nurse Communication: Mobility status PT Visit Diagnosis: Unsteadiness on feet (R26.81);Other abnormalities of gait and mobility (R26.89);Muscle weakness (generalized) (M62.81)     Time: [id number] PT Time Calculation (min) (ACUTE ONLY): 34 min  Charges:  $Gait Training: 8-22 mins $Therapeutic Activity: 8-22 mins                     Michelle Nasuti, PTA Acute Rehabilitation Services Secure Chat Preferred   [phone]    Michelle Nasuti 02/01/2023, 3:53 PM

## 2023-02-01 NOTE — Progress Notes (Signed)
PROGRESS NOTE  Natasha Woodard  DOB: 1941/01/08  PCP: Tonia Ghent, MD [id number]  DOA: 01/10/2023  LOS: 21 days  Hospital Day: 23  Brief narrative: Natasha Woodard is a 81 y.o. female with PMH significant for DM2, HTN, HLD, nonischemic cardiomyopathy secondary to PVCs suppressed on low-dose amiodarone with ultimate recovery of EF. 1/30, patient presented to the ED with progressively worsening chest pain, worsening shortness of breath.  Troponin went up to over 2000.  She was given IV Lasix, stat echo showed EF of 20 to 25% with wall motion abnormalities.  She underwent urgent cardiac catheterization.  She was found to have acute lateral wall MI secondary to distal LCx occlusion complicated by severe ischemic MR and acute systolic heart failure with low output (EF 20 to 25%).  She required placement of IABP which was later removed given concern of developing limb ischemia.  1/31, she was found to have right femoral pseudoaneurysm was ultimately ruptured for which she required emergent repair on 2/4. Postcardiac cath, she was started on amiodarone drip to suppress PVCs. 2/8, she had VT arrest, ROSC achieved after a shock.  She was started on milrinone drip. 2/12, underwent ICD placement. 2/14, milrinone and amiodarone stopped. 2/15, returned to OR for washout and VAC change with sartorious muscle flap.  2/17: Transferred from heart failure team to [facility] service 2/20: Back to the OR for right groin washout, debridement, wound VAC replacement  Subjective: Patient was seen and examined this morning.  Elderly African-American female.  Sitting up in recliner.  Not in distress.  Not on oxygen.  Husband and son at bedside. Chart reviewed. Overnight, vital signs stable Last set of labs from 2/20  Assessment and plan: Acute lateral wall MI  Presented with progressively worsening shortness of breath noted distal occlusion of the circumflex marginal vessel complicated by severe ischemic MR  and acute systolic CHF treated with DES.   Continue aspirin Plavix and statin  Cardiogenic shock Severe ischemic MR and acute systolic CHF Echo [id number] w/ EF 20-25%, mildly reduced RV function, severe MR During cardiac cath, patient developed cardiogenic shock, initially required IABP which was later removed given concern of developing limb ischemia. TEE 2/7 confirmed severe MR ICD placement 2/12 Heart failure team following. Patient required inotropes, diuresis and midodrine Currently on Lasix IV 40 mg twice daily.  Paroxysmal atrial fibrillation VT arrest 2/7 S/p DCCV x 1 with CPR, ROSC after 2 minutes.  Polymorphic VT, appeared bradycardia-mediated with intermittent junctional rhythm. s/p DDD ICD on 2/12 Currently on amiodarone 200 mg twice daily Discussed with cardiology.  Anticoagulation to be started prior to discharge after the vascular procedures have completed.   Right femoral pseudoaneurysm Right groin wound, secondary infection Patient developed pseudoaneurysm following balloon pump, s/p thrombin injection by Dr. Carlis Abbott 1/31.  2/4 stat u/s showed recurrent PSA with active bleeding into PSA. S/p PSA repair with VAC placement.  2/15 VAC change with sartorious muscle flap.  2/20, went to the OR for washout and wound VAC placement. Noted a plan from vascular surgery to change VAC in OR next week Wound culture showed polymicrobial growth showing moderate Morganella and Enterococcus with few  pseudomonas. Currently on IV Zosyn.  Continue antibiotics for another 1 week.   AKI on CKD stage 3b Baseline creatinine 1.3-1.5.  Creatinine peaked at 2.06 on 2/13. Improving trend as below. Recent Labs    01/23/23 0500 01/24/23 [id number] 01/24/23 1150 01/25/23 [id number] 01/26/23 0441 01/27/23 0244 01/28/23 0042 01/29/23 0759 01/30/23 [id number]  01/31/23 0730 02/01/23 0805  BUN 40*  --  40* 40* 38* 44* 45* 41* 37* 32* 37*  CREATININE 1.97* 2.06* 1.97* 1.85* 1.80* 1.89* 1.66* 1.52* 1.47* 1.34*  1.62*    Chronic anemia Severe iron deficiency Secondary to blood loss and iron deficiency given IV iron 2/18 Hemoglobin trend as below.  Stable above 8 for last 1 week. Recent Labs    01/13/23 0857 01/13/23 0858 01/13/23 1959 01/25/23 [id number] 01/26/23 0441 01/29/23 0759 01/30/23 0712 01/31/23 0730  HGB  --   --    < > 8.2* 8.4* 8.7* 8.5* 8.7*  MCV  --   --    < > 95.7 95.4 98.3 101.4* 100.3*  VITAMINB12 298  --   --   --   --   --   --   --   FOLATE >40.0  --   --   --   --   --   --   --   FERRITIN  --  19  --   --   --   --   --   --   TIBC  --  276  --   --   --   --   --   --   IRON  --  14*  --   --   --   --   --   --   RETICCTPCT  --  4.6*  --   --   --   --   --   --    < > = values in this interval not displayed.      Mobility: PT eval obtained.  CIR recommended..  Goals of care   Code Status: Full Code     DVT prophylaxis:  enoxaparin (LOVENOX) injection 30 mg Start: 01/29/23 1430 SCD's Start: 01/11/23 0410   Antimicrobials: IV Zosyn Fluid: None Consultants: Vascular surgery, cardiology Family Communication: Husband at bedside  Status is: Inpatient Level of care: Med-Surg   Dispo: Patient is from: Home              Anticipated d/c is to: Pending clinical course, probably CIR Continue in-hospital care because: Plan for wound VAC change in OR next week.   Scheduled Meds:  acetaminophen  650 mg Oral Q6H   amiodarone  200 mg Oral BID   aspirin  81 mg Oral Daily   Chlorhexidine Gluconate Cloth  6 each Topical Daily   clopidogrel  75 mg Oral Daily   enoxaparin (LOVENOX) injection  30 mg Subcutaneous Q24H   feeding supplement  237 mL Oral TID BM   fentaNYL (SUBLIMAZE) injection  50 mcg Intravenous Once   furosemide  80 mg Intravenous BID   insulin aspart  0-15 Units Subcutaneous TID WC   lidocaine  1 patch Transdermal Q24H   lidocaine  1 patch Transdermal Q24H   rosuvastatin  10 mg Oral QHS   sodium chloride flush  10-40 mL Intracatheter Q12H    sodium chloride flush  3 mL Intravenous Q12H   sodium chloride flush  3 mL Intravenous Q12H    PRN meds: sodium chloride, acetaminophen, alum & mag hydroxide-simeth, hydrocortisone cream, ipratropium-albuterol, nitroGLYCERIN, ondansetron (ZOFRAN) IV, mouth rinse, oxyCODONE, simethicone, sodium chloride flush, sodium chloride flush, traMADol   Infusions:   sodium chloride 250 mL (01/23/23 1235)   piperacillin-tazobactam (ZOSYN)  IV 3.375 g (02/01/23 0535)    Diet:  Diet Order             Diet  Heart Room service appropriate? Yes; Fluid consistency: Thin; Fluid restriction: 2000 mL Fluid  Diet effective now                   Antimicrobials: Anti-infectives (From admission, onward)    Start     Dose/Rate Route Frequency Ordered Stop   02/01/23 1700  vancomycin (VANCOCIN) IVPB 1000 mg/200 mL premix  Status:  Discontinued        1,000 mg 200 mL/hr over 60 Minutes Intravenous Every 48 hours 01/30/23 1548 01/31/23 1405   01/31/23 1400  cefTAZidime (FORTAZ) 2 g in sodium chloride 0.9 % 100 mL IVPB  Status:  Discontinued        2 g 200 mL/hr over 30 Minutes Intravenous Every 24 hours 01/30/23 1539 01/30/23 1548   01/31/23 1032  ceFAZolin 1 g / gentamicin 80 mg in NS 500 mL surgical irrigation  Status:  Discontinued          As needed 01/31/23 1033 01/31/23 1046   01/31/23 0600  piperacillin-tazobactam (ZOSYN) IVPB 3.375 g        3.375 g 12.5 mL/hr over 240 Minutes Intravenous Every 8 hours 01/30/23 1606     01/30/23 1800  piperacillin-tazobactam (ZOSYN) IVPB 3.375 g  Status:  Discontinued        3.375 g 12.5 mL/hr over 240 Minutes Intravenous Every 8 hours 01/30/23 1556 01/30/23 1606   01/30/23 1700  piperacillin-tazobactam (ZOSYN) IVPB 3.375 g  Status:  Discontinued        3.375 g 12.5 mL/hr over 240 Minutes Intravenous STAT 01/30/23 1606 01/30/23 1609   01/30/23 1700  cefTAZidime (FORTAZ) 2 g in sodium chloride 0.9 % 100 mL IVPB        2 g 200 mL/hr over 30 Minutes Intravenous   Once 01/30/23 1610 01/30/23 2131   01/30/23 1600  cefTAZidime (FORTAZ) 2 g in sodium chloride 0.9 % 100 mL IVPB  Status:  Discontinued        2 g 200 mL/hr over 30 Minutes Intravenous Every 24 hours 01/30/23 1548 01/30/23 1556   01/30/23 1400  cefTAZidime (FORTAZ) 2 g in sodium chloride 0.9 % 100 mL IVPB  Status:  Discontinued        2 g 200 mL/hr over 30 Minutes Intravenous Every 8 hours 01/30/23 1028 01/30/23 1539   01/30/23 1115  vancomycin (VANCOREADY) IVPB 1500 mg/300 mL  Status:  Discontinued        1,500 mg 150 mL/hr over 120 Minutes Intravenous  Once 01/30/23 1028 01/30/23 1556   01/29/23 2200  ceFAZolin (ANCEF) IVPB 2g/100 mL premix        2 g 200 mL/hr over 30 Minutes Intravenous 2 times daily 01/29/23 1335 01/29/23 2128   01/26/23 0000  vancomycin (VANCOCIN) IVPB 1000 mg/200 mL premix        1,000 mg 200 mL/hr over 60 Minutes Intravenous On call 01/25/23 0844 01/26/23 0944   01/24/23 2200  ceFAZolin (ANCEF) IVPB 2g/100 mL premix  Status:  Discontinued        2 g 200 mL/hr over 30 Minutes Intravenous 2 times daily 01/24/23 1023 01/29/23 1335   01/24/23 0900  ceFAZolin (ANCEF) IVPB 1 g/50 mL premix  Status:  Discontinued        1 g 100 mL/hr over 30 Minutes Intravenous 2 times daily 01/24/23 0823 01/24/23 1023   01/24/23 0430  ceFAZolin (ANCEF) IVPB 1 g/50 mL premix        1 g 100 mL/hr over 30  Minutes Intravenous Every 12 hours 01/23/23 1942 01/24/23 0524   01/23/23 0800  ceFAZolin (ANCEF) IVPB 2g/100 mL premix        2 g 200 mL/hr over 30 Minutes Intravenous On call 01/23/23 0011 01/23/23 1710   01/23/23 0100  gentamicin (GARAMYCIN) 80 mg in sodium chloride 0.9 % 500 mL irrigation        80 mg Irrigation On call 01/23/23 0011 01/23/23 1749   01/10/23 1958  ceFAZolin (ANCEF) IVPB 2 g/50 mL premix        over 30 Minutes  Continuous PRN 01/10/23 1958 01/10/23 2036       Skin assessment:       Nutritional status:  Body mass index is 33.51 kg/m.  Nutrition Problem:  Moderate Malnutrition Etiology: acute illness Signs/Symptoms: mild fat depletion, mild muscle depletion, moderate muscle depletion     Objective: Vitals:   02/01/23 0359 02/01/23 0740  BP: 113/61 120/64  Pulse: 70 70  Resp:  16  Temp: 98.2 F (36.8 C) 98.1 F (36.7 C)  SpO2: 98% 95%    Intake/Output Summary (Last 24 hours) at 02/01/2023 1330 Last data filed at 02/01/2023 1101 Gross per 24 hour  Intake 1811.42 ml  Output 950 ml  Net 861.42 ml   Filed Weights   01/28/23 0613 01/29/23 0327 02/01/23 0500  Weight: 76.5 kg 78.9 kg 83.1 kg   Weight change:  Body mass index is 33.51 kg/m.   Physical Exam: General exam: Pleasant elderly African-American female.  Not in distress. Skin: No rashes, lesions or ulcers.  Congenital ichthyosis HEENT: Atraumatic, normocephalic, no obvious bleeding Lungs: Clear to auscultation bilaterally CVS: Regular rate and rhythm, no murmur GI/Abd soft, nontender, nondistended, bowel sound present CNS: Alert, awake, oriented x 3.  Hard of hearing Psychiatry: Mood appropriate Extremities: Improving bilateral pedal edema.  No calf tenderness.  Data Review: I have personally reviewed the laboratory data and studies available.  F/u labs ordered Unresulted Labs (From admission, onward)     Start     Ordered   02/02/23 [id number]  Basic metabolic panel  Daily,   R     Question:  Specimen collection method  Answer:  Lab=Lab collect   02/01/23 1030   02/02/23 0500  CBC  Daily,   R     Question:  Specimen collection method  Answer:  Lab=Lab collect   02/01/23 1030   02/01/23 1054  Brain natriuretic peptide  Once,   AD       Question:  Specimen collection method  Answer:  Lab=Lab collect   02/01/23 1053   01/27/23 1548  Zinc  Once,   R        01/27/23 1548   01/27/23 1344  Vitamin C  Once,   R       Question:  Specimen collection method  Answer:  Lab=Lab collect   01/27/23 1343   01/26/23 1112  Acid Fast Culture with reflexed sensitivities  RELEASE  UPON ORDERING,   TIMED       Comments: Specimen A: Pre-op diagnosis: RIGHT GROIN WOUND    01/26/23 1112   01/26/23 0500  Magnesium  Daily,   R     Question:  Specimen collection method  Answer:  Lab=Lab collect   01/25/23 0754            Total time spent in review of labs and imaging, patient evaluation, formulation of plan, documentation and communication with family: 48 minutes  Signed, Terrilee Croak, MD [facility]  02/01/2023  

## 2023-02-01 NOTE — Progress Notes (Addendum)
  Progress Note    02/01/2023 8:29 AM 1 Day Post-Op  Subjective:  no complaints  afebrile  Vitals:   02/01/23 0359 02/01/23 0740  BP: 113/61 120/64  Pulse: 70 70  Resp:  16  Temp: 98.2 F (36.8 C) 98.1 F (36.7 C)  SpO2: 98% 95%    Physical Exam: General:  no distress Lungs:  non labored Incisions:  right groin with wound vac in place with good seal Extremities:  right foot is warm and well perfused.  Motor and sensory in tact Abdomen:  soft  CBC    Component Value Date/Time   WBC 9.4 01/31/2023 0730   RBC 2.91 (L) 01/31/2023 0730   HGB 8.7 (L) 01/31/2023 0730   HGB 14.2 10/09/2020 0935   HCT 29.2 (L) 01/31/2023 0730   HCT 44.9 10/09/2020 0935   PLT 353 01/31/2023 0730   PLT 379 10/09/2020 0935   MCV 100.3 (H) 01/31/2023 0730   MCV 91 10/09/2020 0935   MCH 29.9 01/31/2023 0730   MCHC 29.8 (L) 01/31/2023 0730   RDW 19.5 (H) 01/31/2023 0730   RDW 13.4 10/09/2020 0935   LYMPHSABS 0.9 01/15/2023 0950   LYMPHSABS 1.1 10/09/2020 0935   MONOABS 1.4 (H) 01/15/2023 0950   EOSABS 0.6 (H) 01/15/2023 0950   EOSABS 0.2 10/09/2020 0935   BASOSABS 0.1 01/15/2023 0950   BASOSABS 0.1 10/09/2020 0935    BMET    Component Value Date/Time   NA 141 01/31/2023 0730   NA 145 (H) 07/07/2022 1007   K 3.7 01/31/2023 0730   CL 103 01/31/2023 0730   CO2 30 01/31/2023 0730   GLUCOSE 106 (H) 01/31/2023 0730   BUN 32 (H) 01/31/2023 0730   BUN 24 07/07/2022 1007   CREATININE 1.34 (H) 01/31/2023 0730   CREATININE 1.28 (H) 01/14/2022 1551   CALCIUM 9.2 01/31/2023 0730   GFRNONAA 40 (L) 01/31/2023 0730   GFRAA 49 (L) 10/09/2020 0933    INR    Component Value Date/Time   INR 1.4 (H) 01/10/2023 2145     Intake/Output Summary (Last 24 hours) at 02/01/2023 [id number] Last data filed at 02/01/2023 0600 Gross per 24 hour  Intake 2061.42 ml  Output 700 ml  Net 1361.42 ml      Assessment/Plan:  82 y.o. female is s/p:  Right Groin washout, debridement, Myriad Morcell placement  (1gram), VAC replacement   1 Day Post-Op   -pt right groin vac with good seal.  May be beneficial to place ABD pad in right groin to wick moisture. -plan for vac change in one week in the OR.   Leontine Locket, PA-C [facility] [phone] 02/01/2023 8:29 AM  VASCULAR STAFF ADDENDUM: I have independently interviewed and examined the patient. I agree with the above.  Please continue abx VAC to be changed early next week in OR Pt with poor sleep.  Working on pain control.     Cassandria Santee, MD [facility] Phone Number: [phone] 02/01/2023 2:47 PM

## 2023-02-01 NOTE — Progress Notes (Signed)
Inpatient Rehabilitation Admissions Coordinator   I met with patient ,spouse, son and family member at bedside. We discussed her progress with mobility and continued wound care. Spouse expresses that she will not discharge home until her wound is healed. I will follow from a distance.  Danne Baxter, RN, MSN Rehab Admissions Coordinator [phone] 02/01/2023 11:42 AM

## 2023-02-01 NOTE — Plan of Care (Signed)
  Problem: Education: Goal: Understanding of CV disease, CV risk reduction, and recovery process will improve Outcome: Progressing Goal: Individualized Educational Video(s) Outcome: Progressing   Problem: Activity: Goal: Ability to return to baseline activity level will improve Outcome: Progressing   Problem: Cardiovascular: Goal: Ability to achieve and maintain adequate cardiovascular perfusion will improve Outcome: Progressing Goal: Vascular access site(s) Level 0-1 will be maintained Outcome: Progressing   Problem: Health Behavior/Discharge Planning: Goal: Ability to safely manage health-related needs after discharge will improve Outcome: Progressing   Problem: Education: Goal: Understanding of cardiac disease, CV risk reduction, and recovery process will improve Outcome: Progressing Goal: Individualized Educational Video(s) Outcome: Progressing   Problem: Activity: Goal: Ability to tolerate increased activity will improve Outcome: Progressing   Problem: Cardiac: Goal: Ability to achieve and maintain adequate cardiovascular perfusion will improve Outcome: Progressing   Problem: Health Behavior/Discharge Planning: Goal: Ability to safely manage health-related needs after discharge will improve Outcome: Progressing   Problem: Education: Goal: Ability to describe self-care measures that may prevent or decrease complications (Diabetes Survival Skills Education) will improve Outcome: Progressing Goal: Individualized Educational Video(s) Outcome: Progressing   Problem: Coping: Goal: Ability to adjust to condition or change in health will improve Outcome: Progressing   Problem: Fluid Volume: Goal: Ability to maintain a balanced intake and output will improve Outcome: Progressing   Problem: Health Behavior/Discharge Planning: Goal: Ability to identify and utilize available resources and services will improve Outcome: Progressing Goal: Ability to manage health-related  needs will improve Outcome: Progressing   Problem: Metabolic: Goal: Ability to maintain appropriate glucose levels will improve Outcome: Progressing   Problem: Nutritional: Goal: Maintenance of adequate nutrition will improve Outcome: Progressing Goal: Progress toward achieving an optimal weight will improve Outcome: Progressing   Problem: Skin Integrity: Goal: Risk for impaired skin integrity will decrease Outcome: Progressing   Problem: Tissue Perfusion: Goal: Adequacy of tissue perfusion will improve Outcome: Progressing   Problem: Education: Goal: Knowledge of General Education information will improve Description: Including pain rating scale, medication(s)/side effects and non-pharmacologic comfort measures Outcome: Progressing   Problem: Health Behavior/Discharge Planning: Goal: Ability to manage health-related needs will improve Outcome: Progressing   Problem: Clinical Measurements: Goal: Ability to maintain clinical measurements within normal limits will improve Outcome: Progressing Goal: Will remain free from infection Outcome: Progressing Goal: Diagnostic test results will improve Outcome: Progressing Goal: Respiratory complications will improve Outcome: Progressing Goal: Cardiovascular complication will be avoided Outcome: Progressing   Problem: Activity: Goal: Risk for activity intolerance will decrease Outcome: Progressing   Problem: Nutrition: Goal: Adequate nutrition will be maintained Outcome: Progressing   Problem: Coping: Goal: Level of anxiety will decrease Outcome: Progressing   Problem: Elimination: Goal: Will not experience complications related to bowel motility Outcome: Progressing Goal: Will not experience complications related to urinary retention Outcome: Progressing   Problem: Pain Managment: Goal: General experience of comfort will improve Outcome: Progressing   Problem: Safety: Goal: Ability to remain free from injury will  improve Outcome: Progressing   Problem: Skin Integrity: Goal: Risk for impaired skin integrity will decrease Outcome: Progressing   Problem: Education: Goal: Knowledge of cardiac device and self-care will improve Outcome: Progressing Goal: Ability to safely manage health related needs after discharge will improve Outcome: Progressing Goal: Individualized Educational Video(s) Outcome: Progressing   Problem: Cardiac: Goal: Ability to achieve and maintain adequate cardiopulmonary perfusion will improve Outcome: Progressing

## 2023-02-01 NOTE — Progress Notes (Addendum)
Patient ID: Natasha Woodard, female   DOB: 1941-03-31, 82 y.o.   MRN: [id number]     Advanced Heart Failure Rounding Note  PCP-Cardiologist: Dr. Ellyn Hack AHF: Dr. 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 Now admitted for acute lateral wall MI 2/2 distal LCx occlusion c/b severe ischemic MR and acute systolic heart failure w/ low output, CI 2.1 on RHC. Required placement of IABP, but later removed given concerns for developing limb ischemia=>found to have Right femoral PSA then ultimate rupture s/p emergent repair. VT arrest 2/8. ICD placed 2/12.  Subjective:   1/31 Underwent thrombin injection to right femoral PSA . Hgb drifting down 10.3>8.3>6.8  2/1 Started on amio drip to suppress PVCs. Got dose of IV lasix x1. CVP low.  2/4 Ruptured PSA. Returned to OR for repair. VAC in place 2/5 Midodrine 5 mg three times daily added.  Back in A fib.  2/7 TEE with mod MR.  2/8 VT arrest--> shock x1 ROSC after 2 minutes. Given lidocaine and amio was increased to 60 mg.  2/9 Transvenous pacemaker placed for overdrive pacing with PMVT.  2/11 Milrinone restarted, Co-ox 49% 2/12 PICC removed. S/p [employer] Sci ICD  2/14 Milrinone stopped . Amio drip stopped.  2/15 Returned to OR for washout and VAC change with sartorious muscle flap.    Went to OR 2/20 for washout and wound vac placement.  Plan for wound vac change in OR next week.  Patient not being weighed daily. UOP questionable. Up 10 lbs in 3 days? Feels ok this morning. Denies CP/SOB. Husband and family at bedside.  Wound culture growing morganella and enterococcus. Abx per primary   Objective:   Weight Range: 83.1 kg Body mass index is 33.51 kg/m.   Vital Signs:   Temp:  [97.3 F (36.3 C)-98.2 F (36.8 C)] 98.1 F (36.7 C) (02/21 0740) Pulse Rate:  [69-76] 70 (02/21 0740) Resp:  [16-20] 16 (02/21 0740) BP: (111-136)/(54-72) 120/64 (02/21  0740) SpO2:  [92 %-100 %] 95 % (02/21 0740) Weight:  [83.1 kg] 83.1 kg (02/21 0500) Last BM Date : 01/28/23  Weight change: Filed Weights   01/28/23 0613 01/29/23 0327 02/01/23 0500  Weight: 76.5 kg 78.9 kg 83.1 kg    Intake/Output:   Intake/Output Summary (Last 24 hours) at 02/01/2023 1027 Last data filed at 02/01/2023 0600 Gross per 24 hour  Intake 1811.42 ml  Output 700 ml  Net 1111.42 ml     Physical Exam  General:  chronically ill / elderly appearing.  No respiratory difficulty HEENT: normal Neck: supple. JVD to ear. Carotids 2+ bilat; no bruits. No lymphadenopathy or thyromegaly appreciated. Cor: PMI nondisplaced. Regular rate & rhythm. No rubs, gallops or murmurs. Lungs: diminished Abdomen: soft, nontender, nondistended. No hepatosplenomegaly. No bruits or masses. Good bowel sounds. Extremities: no cyanosis, clubbing, rash, +1-2 RLE edema. Non-pitting LLE edema. Wound VAC R groin Neuro: alert & oriented x 3, cranial nerves grossly intact. moves all 4 extremities w/o difficulty. Affect pleasant.   Telemetry    A paced 90s (Personally reviewed)    Labs    CBC Recent Labs    01/30/23 0712 01/31/23 0730  WBC 9.7 9.4  HGB 8.5* 8.7*  HCT 28.8* 29.2*  MCV 101.4* 100.3*  PLT 355 0000000   Basic Metabolic Panel Recent Labs    01/30/23 0712 01/31/23 0730 02/01/23 0809  NA 143 141  --  K 5.1 3.7  --   CL 101 103  --   CO2 30 30  --   GLUCOSE 127* 106*  --   BUN 37* 32*  --   CREATININE 1.47* 1.34*  --   CALCIUM 9.6 9.2  --   MG 2.1 1.9 2.0   BNP: BNP (last 3 results) Recent Labs    06/17/22 1000 08/31/22 1613 01/10/23 1048  BNP 1,397.5* 1,178.5* >4,500.0*   Imaging   No results found.  Medications:     Scheduled Medications:  acetaminophen  650 mg Oral Q6H   amiodarone  200 mg Oral BID   aspirin  81 mg Oral Daily   Chlorhexidine Gluconate Cloth  6 each Topical Daily   clopidogrel  75 mg Oral Daily   enoxaparin (LOVENOX) injection  30 mg  Subcutaneous Q24H   feeding supplement  237 mL Oral TID BM   fentaNYL (SUBLIMAZE) injection  50 mcg Intravenous Once   furosemide  40 mg Intravenous BID   insulin aspart  0-15 Units Subcutaneous TID WC   lidocaine  1 patch Transdermal Q24H   lidocaine  1 patch Transdermal Q24H   rosuvastatin  10 mg Oral QHS   sodium chloride flush  10-40 mL Intracatheter Q12H   sodium chloride flush  3 mL Intravenous Q12H   sodium chloride flush  3 mL Intravenous Q12H    Infusions:  sodium chloride 250 mL (01/23/23 1235)   ferric gluconate (FERRLECIT) IVPB 250 mg (02/01/23 0830)   piperacillin-tazobactam (ZOSYN)  IV 3.375 g (02/01/23 0535)   PRN Medications: sodium chloride, acetaminophen, alum & mag hydroxide-simeth, hydrocortisone cream, ipratropium-albuterol, nitroGLYCERIN, ondansetron (ZOFRAN) IV, mouth rinse, oxyCODONE, simethicone, sodium chloride flush, sodium chloride flush, traMADol  Assessment/Plan  1. Cardiogenic shock/acute on chronic systolic CHF: Echo this admission with EF 20-25%, mild LV dilation, mild RV dysfunction, severe MR with restricted posterior leaflet.  Echo in 2/23 with EF 50-55%.  Patient has history of nonischemic cardiomyopathy (?PVC-mediated) that improved.  She reports 2 months of dyspnea then had lateral STEMI 1/30 with chest pain.  PLOM occlusion not thought to explain the extent of her cardiomyopathy, so suspect EF may have been falling prior to this event (symptomatic at least 2 months).  Suspect mixed ischemic/nonischemic cardiomyopathy.  She initially had IABP for cardiogenic shock, now removed due to right leg ischemia. Was weaned off pressors/inotropes but Milrinone restarted 2/11 for low Co-ox and rising SCr. Now off milrinone.  - Got 40 IV lasix this morning, volume overloaded on exam. Will do another 40 now and increase to 80 BID.  - Would consider Jardiance as she is getting more active but given groin infection will hold off - additional GDMT initially limited by  hypotension and AKI. BP and Scr improving. Tolerating midodrine wean.  - Stable off midodrine.  - S/p  DDD ICD. Appreciate EP  - Strict I&O, daily weights 2. Mitral regurgitation: severe MR with restricted posterior leaflet on initial echo, suspect infarct-related MR with PLOM occlusion. TEE was later done showing moderate MR.  3. CAD: Lateral STEMI with occluded pLOM, treated with DES.   - Continue ASA 81 - Switched from Brilinta to plavix.  - Continue statin.  4. 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  - Continue amio 200 mg twice a day.   5. Right femoral PSA: s/p thrombin injection by Dr. Carlis Abbott. 2/4 stat u/s with recurrent PSA with active bleeding into PSA. S/p PSA repair with VAC placement.  2/15 VAC change with sartorious muscle flap. - vascular following. Went to OR 2/20 for washout and wound vac placement.  Plan for wound vac change in OR next week. - She is on Zosyn for wound infection.  6. AKI on CKD stage 3b: Baseline 1.3-1.5.  Creatinine trended up to 2.06. Milrinone restarted for low output. SCr plateued 1.9. Back to baseline recently. BMET pending 7.  Acute blood loss anemia:  - monitor CBC - monitor closely  8. Atrial fibrillation: Paroxysmal.  - Per EP hold a/c x 1 wk post ICD placement. No heparin  - Remains in SR. - Amiodarone 200 mg bid  9. VT Arrest: 2/7, DCCV x 1 with CPR, ROSC after 2 minutes.  Polymorphic VT, appeared bradycardia-mediated with intermittent junctional rhythm. - s/p DDD ICD on 2/12 - Continue amiodarone.  Earnie Larsson, AGACNP-BC  02/01/2023  Patient seen with NP, agree with the above note.   She is now off midodrine, BP stable.    She is on Zosyn for wound infection.    General: NAD Neck: JVP 8-9 cm, no thyromegaly or thyroid nodule.  Lungs: Clear to auscultation bilaterally with normal respiratory effort. CV: Nondisplaced PMI.  Heart regular S1/S2, no S3/S4, no murmur.  1+ ankle edema.  Abdomen: Soft,  nontender, no hepatosplenomegaly, no distention.  Skin: Intact without lesions or rashes.  Neurologic: Alert and oriented x 3.  Psych: Normal affect. Extremities: No clubbing or cyanosis.  HEENT: Normal.   Weight up, mild volume overload by exam.  Creatinine also mildly higher at 1.62.  - Lasix 80 mg IV bid and would reassess tomorrow.   She remains on Zosyn for groin wound infection.   Will check with [facility] to see if she could resume her anticoagulation at this point (PAF).   Loralie Champagne 02/01/2023

## 2023-02-02 ENCOUNTER — Other Ambulatory Visit (HOSPITAL_COMMUNITY): Payer: Self-pay

## 2023-02-02 DIAGNOSIS — I214 Non-ST elevation (NSTEMI) myocardial infarction: Secondary | ICD-10-CM | POA: Diagnosis not present

## 2023-02-02 LAB — GLUCOSE, CAPILLARY
Glucose-Capillary: 109 mg/dL — ABNORMAL HIGH (ref 70–99)
Glucose-Capillary: 176 mg/dL — ABNORMAL HIGH (ref 70–99)
Glucose-Capillary: 87 mg/dL (ref 70–99)

## 2023-02-02 LAB — COOXEMETRY PANEL
Carboxyhemoglobin: 2.4 % — ABNORMAL HIGH (ref 0.5–1.5)
Methemoglobin: <0.7 % (ref 0.0–1.5)
O2 Saturation: 63.1 %
Total hemoglobin: 9.1 g/dL — ABNORMAL LOW (ref 12.0–16.0)

## 2023-02-02 LAB — BASIC METABOLIC PANEL
Anion gap: 10 (ref 5–15)
BUN: 37 mg/dL — ABNORMAL HIGH (ref 8–23)
CO2: 27 mmol/L (ref 22–32)
Calcium: 9.2 mg/dL (ref 8.9–10.3)
Chloride: 106 mmol/L (ref 98–111)
Creatinine, Ser: 1.74 mg/dL — ABNORMAL HIGH (ref 0.44–1.00)
GFR, Estimated: 29 mL/min — ABNORMAL LOW (ref >60)
Glucose, Bld: 104 mg/dL — ABNORMAL HIGH (ref 70–99)
Potassium: 3.5 mmol/L (ref 3.5–5.1)
Sodium: 143 mmol/L (ref 135–145)

## 2023-02-02 LAB — CBC
HCT: 29.2 % — ABNORMAL LOW (ref 36.0–46.0)
Hemoglobin: 8.9 g/dL — ABNORMAL LOW (ref 12.0–15.0)
MCH: 30.8 pg (ref 26.0–34.0)
MCHC: 30.5 g/dL (ref 30.0–36.0)
MCV: 101 fL — ABNORMAL HIGH (ref 80.0–100.0)
Platelets: 334 10*3/uL (ref 150–400)
RBC: 2.89 MIL/uL — ABNORMAL LOW (ref 3.87–5.11)
RDW: 21.2 % — ABNORMAL HIGH (ref 11.5–15.5)
WBC: 7.8 10*3/uL (ref 4.0–10.5)
nRBC: 1.9 % — ABNORMAL HIGH (ref 0.0–0.2)

## 2023-02-02 LAB — MAGNESIUM: Magnesium: 2 mg/dL (ref 1.7–2.4)

## 2023-02-02 MED ORDER — APIXABAN 2.5 MG PO TABS
2.5000 mg | ORAL_TABLET | Freq: Two times a day (BID) | ORAL | Status: DC
  Administered 2023-02-02 – 2023-02-19 (×34): 2.5 mg via ORAL
  Filled 2023-02-02 (×35): qty 1

## 2023-02-02 MED ORDER — JUVEN PO PACK
1.0000 | PACK | Freq: Two times a day (BID) | ORAL | Status: DC
  Administered 2023-02-03 – 2023-02-19 (×29): 1 via ORAL
  Filled 2023-02-02 (×31): qty 1

## 2023-02-02 MED ORDER — MILRINONE LACTATE IN DEXTROSE 20-5 MG/100ML-% IV SOLN
0.1250 ug/kg/min | INTRAVENOUS | Status: DC
  Administered 2023-02-02 – 2023-02-07 (×5): 0.125 ug/kg/min via INTRAVENOUS
  Filled 2023-02-02 (×7): qty 100

## 2023-02-02 MED ORDER — POTASSIUM CHLORIDE CRYS ER 20 MEQ PO TBCR
40.0000 meq | EXTENDED_RELEASE_TABLET | Freq: Once | ORAL | Status: AC
  Administered 2023-02-02: 40 meq via ORAL
  Filled 2023-02-02: qty 2

## 2023-02-02 NOTE — Progress Notes (Addendum)
Patient ID: Natasha Woodard, female   DOB: 1941/04/24, 82 y.o.   MRN: [id number]     Advanced Heart Failure Rounding Note  PCP-Cardiologist: Dr. Ellyn Hack AHF: Dr. 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 Now admitted for acute lateral wall MI 2/2 distal LCx occlusion c/b severe ischemic MR and acute systolic heart failure w/ low output, CI 2.1 on RHC. Required placement of IABP, but later removed given concerns for developing limb ischemia=>found to have Right femoral PSA then ultimate rupture s/p emergent repair. VT arrest 2/8. ICD placed 2/12.  Subjective:   1/31 Underwent thrombin injection to right femoral PSA . Hgb drifting down 10.3>8.3>6.8  2/1 Started on amio drip to suppress PVCs. Got dose of IV lasix x1. CVP low.  2/4 Ruptured PSA. Returned to OR for repair. VAC in place 2/5 Midodrine 5 mg three times daily added.  Back in A fib.  2/7 TEE with mod MR.  2/8 VT arrest--> shock x1 ROSC after 2 minutes. Given lidocaine and amio was increased to 60 mg.  2/9 Transvenous pacemaker placed for overdrive pacing with PMVT.  2/11 Milrinone restarted, Co-ox 49% 2/12 PICC removed. S/p [employer] ICD  2/14 Milrinone stopped . Amio drip stopped.  2/15 Returned to OR for washout and VAC change with sartorious muscle flap.    Went to OR 2/20 for washout and wound vac placement.  Plan for wound vac change in OR next week.  Patient not being weighed daily. UOP questionable. Up 10 lbs in 3 days, again? Feels ok this morning, didn't get good rest. Denies CP/SOB. Husband and family at bedside.  Wound culture growing morganella and enterococcus. Abx per primary   Objective:   Weight Range: 83.1 kg Body mass index is 33.51 kg/m.   Vital Signs:   Temp:  [97.9 F (36.6 C)-98.6 F (37 C)] 98.2 F (36.8 C) (02/22 0801) Pulse Rate:  [68-71] 71 (02/22 0801) Resp:  [16] 16 (02/22 0801) BP: (123-126)/(63-71)  126/63 (02/22 0801) SpO2:  [94 %-100 %] 100 % (02/22 0801) Last BM Date : 01/28/23  Weight change: Filed Weights   01/28/23 0613 01/29/23 0327 02/01/23 0500  Weight: 76.5 kg 78.9 kg 83.1 kg    Intake/Output:   Intake/Output Summary (Last 24 hours) at 02/02/2023 1014 Last data filed at 02/02/2023 0926 Gross per 24 hour  Intake 1140.9 ml  Output 700 ml  Net 440.9 ml     Physical Exam  General:  eldelry/ chronically ill appearing.  No respiratory difficulty HEENT: normal Neck: supple. JVD to jaw. Carotids 2+ bilat; no bruits. No lymphadenopathy or thyromegaly appreciated. Cor: PMI nondisplaced. Regular rate & rhythm. No rubs, gallops or murmurs. Lungs: clear Abdomen: soft, nontender, nondistended. No hepatosplenomegaly. No bruits or masses. Good bowel sounds. Extremities: no cyanosis, clubbing, rash, +1 RLE edema non-pitting LLE edema Neuro: alert & oriented x 3, cranial nerves grossly intact. moves all 4 extremities w/o difficulty. Affect pleasant.   Telemetry    A paced 60s (Personally reviewed)    Labs    CBC Recent Labs    01/31/23 0730 02/02/23 0715  WBC 9.4 7.8  HGB 8.7* 8.9*  HCT 29.2* 29.2*  MCV 100.3* 101.0*  PLT 353 A999333   Basic Metabolic Panel Recent Labs    02/01/23 0805 02/01/23 0809 02/02/23 0715  NA 142  --  143  K 3.8  --  3.5  CL  103  --  106  CO2 26  --  27  GLUCOSE 103*  --  104*  BUN 37*  --  37*  CREATININE 1.62*  --  1.74*  CALCIUM 9.2  --  9.2  MG  --  2.0 2.0   BNP: BNP (last 3 results) Recent Labs    08/31/22 1613 01/10/23 1048 02/01/23 1239  BNP 1,178.5* >4,500.0* 914.7*   Imaging   No results found.  Medications:     Scheduled Medications:  acetaminophen  650 mg Oral Q6H   amiodarone  200 mg Oral BID   aspirin  81 mg Oral Daily   Chlorhexidine Gluconate Cloth  6 each Topical Daily   clopidogrel  75 mg Oral Daily   enoxaparin (LOVENOX) injection  30 mg Subcutaneous Q24H   feeding supplement  237 mL Oral TID BM    fentaNYL (SUBLIMAZE) injection  50 mcg Intravenous Once   furosemide  80 mg Intravenous BID   insulin aspart  0-15 Units Subcutaneous TID WC   lidocaine  1 patch Transdermal Q24H   lidocaine  1 patch Transdermal Q24H   rosuvastatin  10 mg Oral QHS   sodium chloride flush  10-40 mL Intracatheter Q12H   sodium chloride flush  3 mL Intravenous Q12H   sodium chloride flush  3 mL Intravenous Q12H    Infusions:  sodium chloride 250 mL (01/23/23 1235)   piperacillin-tazobactam (ZOSYN)  IV 3.375 g (02/02/23 0500)   PRN Medications: sodium chloride, acetaminophen, alum & mag hydroxide-simeth, hydrocortisone cream, ipratropium-albuterol, melatonin, nitroGLYCERIN, ondansetron (ZOFRAN) IV, mouth rinse, oxyCODONE, simethicone, sodium chloride flush, sodium chloride flush, traMADol  Assessment/Plan  1. Cardiogenic shock/acute on chronic systolic CHF: Echo this admission with EF 20-25%, mild LV dilation, mild RV dysfunction, severe MR with restricted posterior leaflet.  Echo in 2/23 with EF 50-55%.  Patient has history of nonischemic cardiomyopathy (?PVC-mediated) that improved.  She reports 2 months of dyspnea then had lateral STEMI 1/30 with chest pain.  PLOM occlusion not thought to explain the extent of her cardiomyopathy, so suspect EF may have been falling prior to this event (symptomatic at least 2 months).  Suspect mixed ischemic/nonischemic cardiomyopathy.  She initially had IABP for cardiogenic shock, now removed due to right leg ischemia. Was weaned off pressors/inotropes but Milrinone restarted 2/11 for low Co-ox and rising SCr. Now off milrinone.  - Continue 80 IV lasix. Not much UOP documented, stated she used BSC several times, unsure what's being documented vs not. Cr. Rising 1.62>1.74.  - Had been stable off milrinone. May need again for diuresis with rise in Scr?, will discuss with MD.  - Would consider Jardiance as she is getting more active but given groin infection will hold off -  additional GDMT initially limited by hypotension and AKI. BP and Scr improving. Tolerating midodrine wean.  - S/p  DDD ICD. Appreciate EP  - Strict I&O, daily weights 2. Mitral regurgitation: severe MR with restricted posterior leaflet on initial echo, suspect infarct-related MR with PLOM occlusion. TEE was later done showing moderate MR.  3. CAD: Lateral STEMI with occluded pLOM, treated with DES.   - Continue ASA 81 - Switched from Brilinta to plavix.  - Continue statin.  4. 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  - Continue amio 200 mg twice a day.   5. Right femoral PSA: s/p thrombin injection by Dr. Carlis Abbott. 2/4 stat u/s with recurrent PSA with active bleeding into PSA. S/p PSA repair  with VAC placement. 2/15 VAC change with sartorious muscle flap. - vascular following. Went to OR 2/20 for washout and wound vac placement.  Plan for wound vac change in OR next week. - She is on Zosyn for wound infection.  6. AKI on CKD stage 3b: Baseline 1.3-1.5.  Creatinine trended up to 2.06. Milrinone restarted for low output. SCr plateued 1.9. Back to baseline recently. BMET pending 7.  Acute blood loss anemia:  - monitor CBC - monitor closely  8. Atrial fibrillation: Paroxysmal.  - Per EP hold a/c x 1 wk post ICD placement. No heparin  - Remains in SR. - Amiodarone 200 mg bid  - per [facility] ok to restart her eliquis 9. VT Arrest: 2/7, DCCV x 1 with CPR, ROSC after 2 minutes.  Polymorphic VT, appeared bradycardia-mediated with intermittent junctional rhythm. - s/p DDD ICD on 2/12 - Continue amiodarone.  Earnie Larsson, AGACNP-BC  02/02/2023  Patient seen and examined with the above-signed Advanced Practice Provider and/or Housestaff. I personally reviewed laboratory data, imaging studies and relevant notes. I independently examined the patient and formulated the important aspects of the plan. I have edited the note to reflect any of my changes or salient points. I have  personally discussed the plan with the patient and/or family.  Feels ok. Denies F/C. No SOB, orthopnea or PND.   Weight up. Not diuresing well.   General:  Weak appearing. No resp difficulty HEENT: normal Neck: supple. JVP to jaw Carotids 2+ bilat; no bruits. No lymphadenopathy or thryomegaly appreciated. Cor: PMI nondisplaced. Regular rate & rhythm. 2/6 MR Lungs: clear Abdomen: soft, nontender, nondistended. No hepatosplenomegaly. No bruits or masses. Good bowel sounds. Extremities: no cyanosis, clubbing, rash, 2-3+ edema  + wound vac in R groin  Neuro: alert & orientedx3, cranial nerves grossly intact. moves all 4 extremities w/o difficulty. Affect pleasant  She is markedly volume overloaded and has poor urine output with increasing lasix doses. Concern for low output. Will move to 3E start milrinone. Place TED hose.  Glori Bickers, MD  4:32 PM

## 2023-02-02 NOTE — Progress Notes (Signed)
Patient is on home unit NIV machine on arrival no complications noted

## 2023-02-02 NOTE — Progress Notes (Signed)
Nutrition Follow-up  DOCUMENTATION CODES:  Non-severe (moderate) malnutrition in context of acute illness/injury  INTERVENTION:  Liberalize diet to Regular to provide widest variety of menu options to optimize PO intake Ensure Enlive po TID, each supplement provides 350 kcal and 20 grams of protein. Protein containing snacks TID between meals 1 packet Juven BID, each packet provides 95 calories, 2.5 grams of protein (collagen), and 9.8 grams of carbohydrate (3 grams sugar)+ additional vitamins and minerals to support wound healing RD maximizing nutrition interventions to assist with wound healing. Question to what degree patient's  hx of Natasha Woodard might impact wound healing   NUTRITION DIAGNOSIS:  Moderate Malnutrition related to acute illness as evidenced by mild fat depletion, mild muscle depletion, moderate muscle depletion. - remains applicable  GOAL:  Patient will meet greater than or equal to 90% of their needs - goal unmet  MONITOR:  PO intake, Supplement acceptance, Labs, Weight trends  REASON FOR ASSESSMENT:  Consult Assessment of nutrition requirement/status  ASSESSMENT:  82 yo female admitted with NSTEMI requiring IABP, pt with bleed and impaired circulation with IABP removed and thrombin injection to pseudoaneurysm with subsequent rupture requiring repair and wound VAC placement. PMH includes NICM, CAD, DM, HLD, hiatal hermia with refux, Natasha Woodard, CKD  1/30 Admitted, IABP 1/31 Thrombin injection to right femoral PSA 2/04 Ruptured PSA-OR for repair 2/08 VT arrest 2/12 ICD placed 2/16 R groin washout with sartorius muscle flap and placement of wound VAC 2/20 s/p R groin washout, debridement, myriad morcell placement, VAC placement  Initially spoke with pt's husband at bedside as pt was resting soundly. He reports that she has not been eating very well as she misses salt for taste with her meals. Her family has been providing meals from home that  pt's enjoys.   Pt awoke and reports ongoing poor appetite. She is puzzled why her appetite hasn't been improving. She states that she is only able to tolerate about 25-50% of each meal which is reflected in nursing's documentation in flowsheets.  She states that she is not drinking but 1 Ensure daily d/t concern over blood sugars. Discussed with her ability to manage blood sugars with insulin adjustments if needed but currently it is important to optimize her PO intake, emphasizing protein, to support wound healing.   Pt agrees to addition of Juven BID and protein containing snacks between meals to try to improve nutritional adequacy.  Pt's weight variable throughout admission however noted continuing to rise within the last several days. Pt noted to being restarted on milrinone and plans to transfer to [facility] cardiac unit.  2/17: 76.5 kg 2/21: 83.1 kg  Edema: non-pitting BUE, BLE  Medications: lasix 68m BID, SSI 0-15 units TID, klor-con IV drips: milrinone, zosyn  Labs: BUN 37, Cr 1.74, GFR 29, CBG's 109-188 x24 hours  Micronutrient labs: CRP 7.8 (H) Vitamin A 39.8 (wdl) Vitamin C 0.4 (wdl) Zinc pending   R groin wound VAC: [id number] hours  Diet Order:   Diet Order             Diet regular Room service appropriate? Yes; Fluid consistency: Thin; Fluid restriction: 2000 mL Fluid  Diet effective now                   EDUCATION NEEDS:   Not appropriate for education at this time  Skin:  Skin Assessment: Skin Integrity Issues: Skin Integrity Issues:: Wound VAC, Other (Comment) Wound Vac: R groin Other: Natasha Woodard since birth  Last BM:  2/19  Height:   Ht Readings from Last 1 Encounters:  01/12/23 5' 2"$  (1.575 m)    Weight:   Wt Readings from Last 1 Encounters:  02/01/23 83.1 kg    Ideal Body Weight:  50 kg  BMI:  Body mass index is 33.51 kg/m.  Estimated Nutritional Needs:   Kcal:  1600-1800 kcals  Protein:  75-90 g  Fluid:  >/= 1.6  L  Clayborne Dana, RDN, LDN Clinical Nutrition

## 2023-02-02 NOTE — Progress Notes (Signed)
Occupational Therapy Treatment Patient Details Name: Natasha Woodard MRN: [id number] DOB: [birth year] Today's Date: 02/02/2023   History of present illness 82 yo female admitted 1/30 with chest pain, NSTEMI s/p cardiac cath and IABP placed. Pt with bleeding and impaired circulation with IABP removed 1/31 and thrombin injection to pseudoaneurysm. 2/4 Rt groin pseudoaneurysm rupture s/p repair with placement of wound VAC. Pt with VT arrest on 2/8. ICD placed 2/12. Pt with rt groin washout and debridement and sartorius muscle flap with VAC placement on 2/15. PMhx: CAD, dilated cardiomyopathy, CKD, GERD, HFpEF, HTN, HLD, T2DM   OT comments  Pt readily willing to get OOB as she needed to have a BM. Plan was to walk into bathroom and use BSC over toilet, but pt with urgency and loose stool and needing to have the [facility] brought closer. Pt able to get to EOB with HOB up, rail and no assist. Min assist for OOB with RW. Dependent in LB dressing and pericare after BM. Completed seated grooming with set up and left pt up in chair with needs met and her family. Continues to be an excellent rehab candidate.    Recommendations for follow up therapy are one component of a multi-disciplinary discharge planning process, led by the attending physician.  Recommendations may be updated based on patient status, additional functional criteria and insurance authorization.    Follow Up Recommendations  Acute inpatient rehab (3hours/day)     Assistance Recommended at Discharge Frequent or constant Supervision/Assistance  Patient can return home with the following  A lot of help with bathing/dressing/bathroom;Assistance with cooking/housework;Direct supervision/assist for medications management;Direct supervision/assist for financial management;Assist for transportation;Help with stairs or ramp for entrance;Two people to help with walking and/or transfers   Equipment Recommendations  None recommended by OT     Recommendations for Other Services      Precautions / Restrictions Precautions Precautions: Fall;ICD/Pacemaker;Other (comment) Precaution Comments: VAC rt groin       Mobility Bed Mobility Overal bed mobility: Needs Assistance Bed Mobility: Supine to Sit     Supine to sit: HOB elevated, Min guard     General bed mobility comments: increased time, heavy use of rail, HOB up    Transfers Overall transfer level: Needs assistance Equipment used: Rolling walker (2 wheels) Transfers: Sit to/from Stand Sit to Stand: Min assist           General transfer comment: from EOB, BSC and recliner, cues for hand placement     Balance Overall balance assessment: Needs assistance   Sitting balance-Leahy Scale: Fair     Standing balance support: Bilateral upper extremity supported, During functional activity, Reliant on assistive device for balance Standing balance-Leahy Scale: Poor Standing balance comment: with RW support                           ADL either performed or assessed with clinical judgement   ADL Overall ADL's : Needs assistance/impaired     Grooming: Wash/dry hands;Oral care;Set up;Sitting               Lower Body Dressing: Total assistance;Bed level   Toilet Transfer: Minimal assistance;Ambulation;Rolling walker (2 wheels);BSC/3in1   Toileting- Clothing Manipulation and Hygiene: Total assistance;Sit to/from stand       Functional mobility during ADLs: Minimal assistance;Rolling walker (2 wheels)      Extremity/Trunk Assessment              Vision  Perception     Praxis      Cognition Arousal/Alertness: Awake/alert Behavior During Therapy: WFL for tasks assessed/performed Overall Cognitive Status: Impaired/Different from baseline Area of Impairment: Memory, Following commands, Problem solving                     Memory: Decreased short-term memory Following Commands: Follows one step commands with  increased time Safety/Judgement: Decreased awareness of safety, Decreased awareness of deficits   Problem Solving: Slow processing          Exercises      Shoulder Instructions       General Comments      Pertinent Vitals/ Pain       Pain Assessment Pain Assessment: Faces Faces Pain Scale: Hurts little more Pain Location: buttocks Pain Descriptors / Indicators: Sore, Grimacing Pain Intervention(s): Repositioned  Home Living                                          Prior Functioning/Environment              Frequency  Min 2X/week        Progress Toward Goals  OT Goals(current goals can now be found in the care plan section)  Progress towards OT goals: Progressing toward goals  Acute Rehab OT Goals OT Goal Formulation: With patient Time For Goal Achievement: 02/10/23 Potential to Achieve Goals: Good  Plan Discharge plan remains appropriate    Co-evaluation                 AM-PAC OT "6 Clicks" Daily Activity     Outcome Measure   Help from another person eating meals?: None Help from another person taking care of personal grooming?: A Little Help from another person toileting, which includes using toliet, bedpan, or urinal?: A Lot Help from another person bathing (including washing, rinsing, drying)?: A Lot Help from another person to put on and taking off regular upper body clothing?: A Lot Help from another person to put on and taking off regular lower body clothing?: A Lot 6 Click Score: 15    End of Session Equipment Utilized During Treatment: Gait belt;Rolling walker (2 wheels)  OT Visit Diagnosis: Other abnormalities of gait and mobility (R26.89);Muscle weakness (generalized) (M62.81);Pain   Activity Tolerance Patient tolerated treatment well   Patient Left in bed;with call bell/phone within reach;with bed alarm set;with family/visitor present   Nurse Communication          Time: [id number] OT Time Calculation  (min): 29 min  Charges: OT General Charges $OT Visit: 1 Visit OT Treatments $Self Care/Home Management : 23-37 mins  Cleta Alberts, OTR/L Acute Rehabilitation Services Office: [phone]   Malka So 02/02/2023, 10:55 AM

## 2023-02-02 NOTE — Progress Notes (Addendum)
PROGRESS NOTE  Natasha Woodard  DOB: 12-09-1941  PCP: Tonia Ghent, MD [id number]  DOA: 01/10/2023  LOS: 52 days  Hospital Day: 24  Brief narrative: Natasha Woodard is a 82 y.o. female with PMH significant for DM2, HTN, HLD, nonischemic cardiomyopathy secondary to PVCs suppressed on low-dose amiodarone with ultimate recovery of EF. 1/30, patient presented to the ED with progressively worsening chest pain, worsening shortness of breath.  Troponin went up to over 2000.  She was given IV Lasix, stat echo showed EF of 20 to 25% with wall motion abnormalities.  She underwent urgent cardiac catheterization.  She was found to have acute lateral wall MI secondary to distal LCx occlusion complicated by severe ischemic MR and acute systolic heart failure with low output (EF 20 to 25%).  She required placement of IABP which was later removed given concern of developing limb ischemia.  1/31, she was found to have right femoral pseudoaneurysm was ultimately ruptured for which she required emergent repair on 2/4. Postcardiac cath, she was started on amiodarone drip to suppress PVCs. 2/8, she had VT arrest, ROSC achieved after a shock.  She was started on milrinone drip. 2/12, underwent ICD placement. 2/14, milrinone and amiodarone stopped. 2/15, returned to OR for washout and VAC change with sartorious muscle flap.  2/17: Transferred from heart failure team to [facility] service 2/20: Back to the OR for right groin washout, debridement, wound VAC replacement  Subjective: Patient was seen and examined this afternoon.  Lying recliner.  Not in distress per no new symptoms.  On low-flow oxygen today. Labs from this morning with creatinine up to 1.74.  Assessment and plan: Acute lateral wall MI  Presented with progressively worsening shortness of breath noted distal occlusion of the circumflex marginal vessel complicated by severe ischemic MR and acute systolic CHF treated with DES.   Continue aspirin  Plavix and statin  Cardiogenic shock Severe ischemic MR and acute systolic CHF Echo [id number] w/ EF 20-25%, mildly reduced RV function, severe MR During cardiac cath, patient developed cardiogenic shock, initially required IABP which was later removed given concern of developing limb ischemia. TEE 2/7 confirmed severe MR ICD placement 2/12 Heart failure team following. Patient required inotropes, diuresis and midodrine Currently on Lasix IV 80 mg twice daily.  Monitor creatinine trend.  Seems to be retaining fluid.  May need milrinone.  Paroxysmal atrial fibrillation VT arrest 2/7 S/p DCCV x 1 with CPR, ROSC after 2 minutes.  Polymorphic VT, appeared bradycardia-mediated with intermittent junctional rhythm. s/p DDD ICD on 2/12 Currently on amiodarone 200 mg twice daily. 2/21, cardiovascular surgery okay with reinitiation of Eliquis.   Right femoral pseudoaneurysm Right groin wound, secondary infection Patient developed pseudoaneurysm following balloon pump, s/p thrombin injection by Dr. Carlis Abbott 1/31.  2/4 stat u/s showed recurrent PSA with active bleeding into PSA. S/p PSA repair with VAC placement.  2/15 VAC change with sartorious muscle flap.  2/20, went to the OR for washout and wound VAC placement. Noted a plan from vascular surgery to change VAC in OR next week Wound culture showed polymicrobial growth showing moderate Morganella and Enterococcus with few  pseudomonas. Currently on IV Zosyn.  Continue antibiotics for another 1 week.   AKI on CKD stage 3b Baseline creatinine 1.3-1.5.  Creatinine peaked at 2.06 on 2/13.  Noted and uptrending last 2 days. Recent Labs    01/24/23 1150 01/25/23 [id number] 01/26/23 0441 01/27/23 0244 01/28/23 0042 01/29/23 [id number] 01/30/23 [id number] 01/31/23 0730 02/01/23 0805 02/02/23 0715  BUN 40* 40* 38* 44* 45* 41* 37* 32* 37* 37*  CREATININE 1.97* 1.85* 1.80* 1.89* 1.66* 1.52* 1.47* 1.34* 1.62* 1.74*    Chronic anemia Severe iron deficiency Secondary  to blood loss and iron deficiency given IV iron 2/18 Hemoglobin trend as below.  Stable above 8 for last 1 week. Recent Labs    01/13/23 0857 01/13/23 0858 01/13/23 1959 01/26/23 0441 01/29/23 0759 01/30/23 0712 01/31/23 0730 02/02/23 0715  HGB  --   --    < > 8.4* 8.7* 8.5* 8.7* 8.9*  MCV  --   --    < > 95.4 98.3 101.4* 100.3* 101.0*  VITAMINB12 298  --   --   --   --   --   --   --   FOLATE >40.0  --   --   --   --   --   --   --   FERRITIN  --  19  --   --   --   --   --   --   TIBC  --  276  --   --   --   --   --   --   IRON  --  14*  --   --   --   --   --   --   RETICCTPCT  --  4.6*  --   --   --   --   --   --    < > = values in this interval not displayed.    Mobility: PT eval obtained.  CIR recommended.  Goals of care   Code Status: Full Code     DVT prophylaxis:  apixaban (ELIQUIS) tablet 2.5 mg Start: 02/02/23 1400 Place TED hose Start: 02/02/23 1353 SCD's Start: 01/11/23 0410 apixaban (ELIQUIS) tablet 2.5 mg   Antimicrobials: IV Zosyn Fluid: None Consultants: Vascular surgery, cardiology Family Communication: Husband at bedside  Status is: Inpatient Level of care: Progressive   Dispo: Patient is from: Home              Anticipated d/c is to: Pending clinical course, probably CIR Continue in-hospital care because: Plan for wound VAC change in OR next week.   Scheduled Meds:  acetaminophen  650 mg Oral Q6H   amiodarone  200 mg Oral BID   apixaban  2.5 mg Oral BID   Chlorhexidine Gluconate Cloth  6 each Topical Daily   clopidogrel  75 mg Oral Daily   feeding supplement  237 mL Oral TID BM   fentaNYL (SUBLIMAZE) injection  50 mcg Intravenous Once   furosemide  80 mg Intravenous BID   insulin aspart  0-15 Units Subcutaneous TID WC   lidocaine  1 patch Transdermal Q24H   lidocaine  1 patch Transdermal Q24H   potassium chloride  40 mEq Oral Once   rosuvastatin  10 mg Oral QHS   sodium chloride flush  10-40 mL Intracatheter Q12H   sodium chloride  flush  3 mL Intravenous Q12H   sodium chloride flush  3 mL Intravenous Q12H    PRN meds: sodium chloride, acetaminophen, alum & mag hydroxide-simeth, hydrocortisone cream, ipratropium-albuterol, melatonin, nitroGLYCERIN, ondansetron (ZOFRAN) IV, mouth rinse, oxyCODONE, simethicone, sodium chloride flush, sodium chloride flush, traMADol   Infusions:   sodium chloride 250 mL (01/23/23 1235)   milrinone     piperacillin-tazobactam (ZOSYN)  IV 3.375 g (02/02/23 0500)    Diet:  Diet Order  Diet Heart Room service appropriate? Yes; Fluid consistency: Thin; Fluid restriction: 2000 mL Fluid  Diet effective now                   Antimicrobials: Anti-infectives (From admission, onward)    Start     Dose/Rate Route Frequency Ordered Stop   02/01/23 1700  vancomycin (VANCOCIN) IVPB 1000 mg/200 mL premix  Status:  Discontinued        1,000 mg 200 mL/hr over 60 Minutes Intravenous Every 48 hours 01/30/23 1548 01/31/23 1405   01/31/23 1400  cefTAZidime (FORTAZ) 2 g in sodium chloride 0.9 % 100 mL IVPB  Status:  Discontinued        2 g 200 mL/hr over 30 Minutes Intravenous Every 24 hours 01/30/23 1539 01/30/23 1548   01/31/23 1032  ceFAZolin 1 g / gentamicin 80 mg in NS 500 mL surgical irrigation  Status:  Discontinued          As needed 01/31/23 1033 01/31/23 1046   01/31/23 0600  piperacillin-tazobactam (ZOSYN) IVPB 3.375 g        3.375 g 12.5 mL/hr over 240 Minutes Intravenous Every 8 hours 01/30/23 1606     01/30/23 1800  piperacillin-tazobactam (ZOSYN) IVPB 3.375 g  Status:  Discontinued        3.375 g 12.5 mL/hr over 240 Minutes Intravenous Every 8 hours 01/30/23 1556 01/30/23 1606   01/30/23 1700  piperacillin-tazobactam (ZOSYN) IVPB 3.375 g  Status:  Discontinued        3.375 g 12.5 mL/hr over 240 Minutes Intravenous STAT 01/30/23 1606 01/30/23 1609   01/30/23 1700  cefTAZidime (FORTAZ) 2 g in sodium chloride 0.9 % 100 mL IVPB        2 g 200 mL/hr over 30 Minutes  Intravenous  Once 01/30/23 1610 01/30/23 2131   01/30/23 1600  cefTAZidime (FORTAZ) 2 g in sodium chloride 0.9 % 100 mL IVPB  Status:  Discontinued        2 g 200 mL/hr over 30 Minutes Intravenous Every 24 hours 01/30/23 1548 01/30/23 1556   01/30/23 1400  cefTAZidime (FORTAZ) 2 g in sodium chloride 0.9 % 100 mL IVPB  Status:  Discontinued        2 g 200 mL/hr over 30 Minutes Intravenous Every 8 hours 01/30/23 1028 01/30/23 1539   01/30/23 1115  vancomycin (VANCOREADY) IVPB 1500 mg/300 mL  Status:  Discontinued        1,500 mg 150 mL/hr over 120 Minutes Intravenous  Once 01/30/23 1028 01/30/23 1556   01/29/23 2200  ceFAZolin (ANCEF) IVPB 2g/100 mL premix        2 g 200 mL/hr over 30 Minutes Intravenous 2 times daily 01/29/23 1335 01/29/23 2128   01/26/23 0000  vancomycin (VANCOCIN) IVPB 1000 mg/200 mL premix        1,000 mg 200 mL/hr over 60 Minutes Intravenous On call 01/25/23 0844 01/26/23 0944   01/24/23 2200  ceFAZolin (ANCEF) IVPB 2g/100 mL premix  Status:  Discontinued        2 g 200 mL/hr over 30 Minutes Intravenous 2 times daily 01/24/23 1023 01/29/23 1335   01/24/23 0900  ceFAZolin (ANCEF) IVPB 1 g/50 mL premix  Status:  Discontinued        1 g 100 mL/hr over 30 Minutes Intravenous 2 times daily 01/24/23 0823 01/24/23 1023   01/24/23 0430  ceFAZolin (ANCEF) IVPB 1 g/50 mL premix        1 g 100 mL/hr over  30 Minutes Intravenous Every 12 hours 01/23/23 1942 01/24/23 0524   01/23/23 0800  ceFAZolin (ANCEF) IVPB 2g/100 mL premix        2 g 200 mL/hr over 30 Minutes Intravenous On call 01/23/23 0011 01/23/23 1710   01/23/23 0100  gentamicin (GARAMYCIN) 80 mg in sodium chloride 0.9 % 500 mL irrigation        80 mg Irrigation On call 01/23/23 0011 01/23/23 1749   01/10/23 1958  ceFAZolin (ANCEF) IVPB 2 g/50 mL premix        over 30 Minutes  Continuous PRN 01/10/23 1958 01/10/23 2036       Skin assessment:       Nutritional status:  Body mass index is 33.51 kg/m.   Nutrition Problem: Moderate Malnutrition Etiology: acute illness Signs/Symptoms: mild fat depletion, mild muscle depletion, moderate muscle depletion     Objective: Vitals:   02/02/23 0419 02/02/23 0801  BP: 125/64 126/63  Pulse: 70 71  Resp: 16 16  Temp: 98.6 F (37 C) 98.2 F (36.8 C)  SpO2: 100% 100%    Intake/Output Summary (Last 24 hours) at 02/02/2023 1359 Last data filed at 02/02/2023 0926 Gross per 24 hour  Intake 1140.9 ml  Output 450 ml  Net 690.9 ml   Filed Weights   01/28/23 0613 01/29/23 0327 02/01/23 0500  Weight: 76.5 kg 78.9 kg 83.1 kg   Weight change:  Body mass index is 33.51 kg/m.   Physical Exam: General exam: Pleasant elderly African-American female.  Not in distress. Skin: No rashes, lesions or ulcers.  Congenital ichthyosis HEENT: Atraumatic, normocephalic, no obvious bleeding Lungs: Clear to auscultation bilaterally CVS: Regular rate and rhythm, no murmur GI/Abd soft, nontender, nondistended, bowel sound present CNS: Alert, awake, oriented x 3.  Hard of hearing Psychiatry: Mood appropriate Extremities: Improving bilateral pedal edema.  No calf tenderness.  Data Review: I have personally reviewed the laboratory data and studies available.  F/u labs ordered Unresulted Labs (From admission, onward)     Start     Ordered   02/03/23 [id number]  Basic metabolic panel  Daily,   R     Comments: While on Milrinone   Question:  Specimen collection method  Answer:  Lab=Lab collect   02/02/23 1352   02/03/23 0500  Magnesium  Daily,   R     Comments: While on Milrinone   Question:  Specimen collection method  Answer:  Lab=Lab collect   02/02/23 1352   02/03/23 0500  Cooxemetry Panel (carboxy, met, total hgb, O2 sat)  Daily,   R     Question:  Specimen collection method  Answer:  Lab=Lab collect   02/02/23 1352   02/02/23 1653  Cooxemetry Panel (carboxy, met, total hgb, O2 sat)  Once,   R       Question:  Specimen collection method  Answer:   Lab=Lab collect   02/02/23 1352   02/02/23 [id number]  Basic metabolic panel  Daily,   R     Question:  Specimen collection method  Answer:  Lab=Lab collect   02/01/23 1030   02/02/23 0500  CBC  Daily,   R     Question:  Specimen collection method  Answer:  Lab=Lab collect   02/01/23 1030   01/27/23 1548  Zinc  Once,   R        01/27/23 1548   01/26/23 1112  Acid Fast Culture with reflexed sensitivities  RELEASE UPON ORDERING,   TIMED  Comments: Specimen A: Pre-op diagnosis: RIGHT GROIN WOUND    01/26/23 1112   01/26/23 0500  Magnesium  Daily,   R     Question:  Specimen collection method  Answer:  Lab=Lab collect   01/25/23 0754            Total time spent in review of labs and imaging, patient evaluation, formulation of plan, documentation and communication with family: 92 minutes  Signed, Terrilee Croak, MD [facility] 02/02/2023

## 2023-02-02 NOTE — TOC Benefit Eligibility Note (Signed)
Patient Advocate Encounter  Insurance verification completed.    The patient is currently admitted and upon discharge could be taking Eliquis 5 mg.  The current 30 day co-pay is $47.00.   The patient is insured through [employer] Medicare Part D   Kaydon Husby, CPHT Pharmacy Patient Advocate Specialist [facility] Patient Advocate Team Direct Number: [phone]  Fax: [phone]       

## 2023-02-02 NOTE — Progress Notes (Addendum)
  Progress Note    02/02/2023 7:59 AM 2 Days Post-Op  Subjective:  no complaints    Vitals:   02/01/23 1920 02/02/23 0419  BP: 126/71 125/64  Pulse: 71 70  Resp:  16  Temp: 98 F (36.7 C) 98.6 F (37 C)  SpO2: 94% 100%    Physical Exam: General:  NAD Cardiac:  RRR Lungs:  nonlabored Incisions:  R groin wound vac with good seal. Had to be reinforced this AM with Ioban Extremities:  BLE warm and well perfused  CBC    Component Value Date/Time   WBC 7.8 02/02/2023 0715   RBC 2.89 (L) 02/02/2023 0715   HGB 8.9 (L) 02/02/2023 0715   HGB 14.2 10/09/2020 0935   HCT 29.2 (L) 02/02/2023 0715   HCT 44.9 10/09/2020 0935   PLT 334 02/02/2023 0715   PLT 379 10/09/2020 0935   MCV 101.0 (H) 02/02/2023 0715   MCV 91 10/09/2020 0935   MCH 30.8 02/02/2023 0715   MCHC 30.5 02/02/2023 0715   RDW 21.2 (H) 02/02/2023 0715   RDW 13.4 10/09/2020 0935   LYMPHSABS 0.9 01/15/2023 0950   LYMPHSABS 1.1 10/09/2020 0935   MONOABS 1.4 (H) 01/15/2023 0950   EOSABS 0.6 (H) 01/15/2023 0950   EOSABS 0.2 10/09/2020 0935   BASOSABS 0.1 01/15/2023 0950   BASOSABS 0.1 10/09/2020 0935    BMET    Component Value Date/Time   NA 142 02/01/2023 0805   NA 145 (H) 07/07/2022 1007   K 3.8 02/01/2023 0805   CL 103 02/01/2023 0805   CO2 26 02/01/2023 0805   GLUCOSE 103 (H) 02/01/2023 0805   BUN 37 (H) 02/01/2023 0805   BUN 24 07/07/2022 1007   CREATININE 1.62 (H) 02/01/2023 0805   CREATININE 1.28 (H) 01/14/2022 1551   CALCIUM 9.2 02/01/2023 0805   GFRNONAA 32 (L) 02/01/2023 0805   GFRAA 49 (L) 10/09/2020 0933    INR    Component Value Date/Time   INR 1.4 (H) 01/10/2023 2145     Intake/Output Summary (Last 24 hours) at 02/02/2023 0759 Last data filed at 02/02/2023 0510 Gross per 24 hour  Intake 780.9 ml  Output 700 ml  Net 80.9 ml      Assessment/Plan:  82 y.o. female is 2 days post op,s/p: right groin washout with placement of myriad morcell and wound vac  -Unfortunately the  patient put vaseline on her right groin and right leg early this morning for her dry skin and caused a loss of seal with the lateral edge of her wound. Wound vac was reinforced with multiple strips of Ioban and a good seal was reobtained -Please ensure patient does not put vaseline on her right groin/right thigh. Please keep an ABD in the right groin  -Vac change early next week in OR -Recommend consultation with dietician to ensure adequate nutrition status for wound healing   Vicente Serene, PA-C [facility] [phone] 02/02/2023 7:59 AM

## 2023-02-02 NOTE — Progress Notes (Signed)
Discussed with Dr. Haroldine Laws.   Plan to start milrinone gtt with rise in Cr and volume overload.   Will transfer patient to [facility] cardiac unit as milrinone gtt not done on 6N.   Forestine Na, AGACNP-BC  Advanced Heart Failure Team  02/02/23 1:56 PM

## 2023-02-03 ENCOUNTER — Telehealth: Payer: Self-pay

## 2023-02-03 ENCOUNTER — Ambulatory Visit: Payer: Medicare Other

## 2023-02-03 DIAGNOSIS — I214 Non-ST elevation (NSTEMI) myocardial infarction: Secondary | ICD-10-CM | POA: Diagnosis not present

## 2023-02-03 LAB — CBC
HCT: 29.8 % — ABNORMAL LOW (ref 36.0–46.0)
Hemoglobin: 8.7 g/dL — ABNORMAL LOW (ref 12.0–15.0)
MCH: 30 pg (ref 26.0–34.0)
MCHC: 29.2 g/dL — ABNORMAL LOW (ref 30.0–36.0)
MCV: 102.8 fL — ABNORMAL HIGH (ref 80.0–100.0)
Platelets: 328 10*3/uL (ref 150–400)
RBC: 2.9 MIL/uL — ABNORMAL LOW (ref 3.87–5.11)
RDW: 21.8 % — ABNORMAL HIGH (ref 11.5–15.5)
WBC: 7.4 10*3/uL (ref 4.0–10.5)
nRBC: 0.7 % — ABNORMAL HIGH (ref 0.0–0.2)

## 2023-02-03 LAB — GLUCOSE, CAPILLARY
Glucose-Capillary: 134 mg/dL — ABNORMAL HIGH (ref 70–99)
Glucose-Capillary: 135 mg/dL — ABNORMAL HIGH (ref 70–99)
Glucose-Capillary: 151 mg/dL — ABNORMAL HIGH (ref 70–99)
Glucose-Capillary: 124 mg/dL — ABNORMAL HIGH (ref 70–99)
Glucose-Capillary: 152 mg/dL — ABNORMAL HIGH (ref 70–99)

## 2023-02-03 LAB — BASIC METABOLIC PANEL
Anion gap: 7 (ref 5–15)
BUN: 36 mg/dL — ABNORMAL HIGH (ref 8–23)
CO2: 30 mmol/L (ref 22–32)
Calcium: 9 mg/dL (ref 8.9–10.3)
Chloride: 105 mmol/L (ref 98–111)
Creatinine, Ser: 1.68 mg/dL — ABNORMAL HIGH (ref 0.44–1.00)
GFR, Estimated: 30 mL/min — ABNORMAL LOW (ref >60)
Glucose, Bld: 133 mg/dL — ABNORMAL HIGH (ref 70–99)
Potassium: 3.8 mmol/L (ref 3.5–5.1)
Sodium: 142 mmol/L (ref 135–145)

## 2023-02-03 LAB — MAGNESIUM: Magnesium: 2 mg/dL (ref 1.7–2.4)

## 2023-02-03 LAB — ZINC: Zinc: 57 ug/dL (ref 44–115)

## 2023-02-03 MED ORDER — METOLAZONE 5 MG PO TABS
5.0000 mg | ORAL_TABLET | Freq: Once | ORAL | Status: AC
  Administered 2023-02-03: 5 mg via ORAL
  Filled 2023-02-03: qty 1

## 2023-02-03 MED ORDER — POTASSIUM CHLORIDE CRYS ER 20 MEQ PO TBCR
40.0000 meq | EXTENDED_RELEASE_TABLET | Freq: Once | ORAL | Status: AC
  Administered 2023-02-03: 40 meq via ORAL
  Filled 2023-02-03: qty 2

## 2023-02-03 NOTE — Progress Notes (Signed)
PROGRESS NOTE  KATHLINE FOWBLE  DOB: [birth year]  PCP: Tonia Ghent, MD [id number]  DOA: 01/10/2023  LOS: 23 days  Hospital Day: 25  Brief narrative: Natasha Woodard is a 82 y.o. female with PMH significant for DM2, HTN, HLD, nonischemic cardiomyopathy secondary to PVCs suppressed on low-dose amiodarone with ultimate recovery of EF. 1/30, patient presented to the ED with progressively worsening chest pain, worsening shortness of breath.  Troponin went up to over 2000.  She was given IV Lasix, stat echo showed EF of 20 to 25% with wall motion abnormalities.  She underwent urgent cardiac catheterization.  She was found to have acute lateral wall MI secondary to distal LCx occlusion complicated by severe ischemic MR and acute systolic heart failure with low output (EF 20 to 25%).  She required placement of IABP which was later removed given concern of developing limb ischemia.  1/31, she was found to have right femoral pseudoaneurysm was ultimately ruptured for which she required emergent repair on 2/4. Postcardiac cath, she was started on amiodarone drip to suppress PVCs. 2/8, she had VT arrest, ROSC achieved after a shock.  She was started on milrinone drip. 2/12, underwent ICD placement. 2/14, milrinone and amiodarone stopped. 2/15, returned to OR for washout and VAC change with sartorious muscle flap.  2/17: Transferred from heart failure team to [facility] service 2/20: Back to the OR for right groin washout, debridement, wound VAC replacement 2/22: Milrinone drip started again.  Subjective: Patient was seen and examined this morning.  Lying down in bed.  Not in distress.  Family at bedside. On milrinone drip.  Noted a plan from heart failure team this morning to add metolazone.  Assessment and plan: Acute lateral wall MI  Presented with progressively worsening shortness of breath noted distal occlusion of the circumflex marginal vessel complicated by severe ischemic MR and acute  systolic CHF treated with DES.   Continue aspirin Plavix and statin  Cardiogenic shock Severe ischemic MR and acute systolic CHF Echo [id number] w/ EF 20-25%, mildly reduced RV function, severe MR During cardiac cath, patient developed cardiogenic shock, initially required IABP which was later removed given concern of developing limb ischemia. TEE 2/7 confirmed severe MR ICD placement 2/12 Currently on IV Lasix and milrinone drip.  Noted a plan to add metolazone as well. Heart failure team following.  Paroxysmal atrial fibrillation VT arrest 2/7 S/p DCCV x 1 with CPR, ROSC after 2 minutes.  Polymorphic VT, appeared bradycardia-mediated with intermittent junctional rhythm. s/p DDD ICD on 2/12 Currently on amiodarone 200 mg twice daily. 2/21, cardiovascular surgery okay with reinitiation of Eliquis.   Right femoral pseudoaneurysm Right groin wound, secondary infection Patient developed pseudoaneurysm following balloon pump, s/p thrombin injection by Dr. Carlis Abbott 1/31.  2/4 stat u/s showed recurrent PSA with active bleeding into PSA. S/p PSA repair with VAC placement.  2/15 VAC change with sartorious muscle flap.  2/20, went to the OR for washout and wound VAC placement. Noted a plan from vascular surgery to change VAC in OR next week Wound culture showed polymicrobial growth showing moderate Morganella and Enterococcus with few  pseudomonas. Currently on IV Zosyn.  Continue antibiotics for another 1 week.   AKI on CKD stage 3b Baseline creatinine 1.3-1.5.  Creatinine peaked at 2.06 on 2/13.  Noted and uptrending last 2 days. Recent Labs    01/25/23 [id number] 01/26/23 0441 01/27/23 0244 01/28/23 0042 01/29/23 0759 01/30/23 [id number] 01/31/23 0730 02/01/23 0805 02/02/23 0715 02/03/23 0614  BUN 40*  38* 44* 45* 41* 37* 32* 37* 37* 36*  CREATININE 1.85* 1.80* 1.89* 1.66* 1.52* 1.47* 1.34* 1.62* 1.74* 1.68*    Chronic anemia Severe iron deficiency Secondary to blood loss and iron  deficiency given IV iron 2/18 Hemoglobin trend as below.  Stable above 8 for last 1 week. Recent Labs    01/13/23 0857 01/13/23 0858 01/13/23 1959 01/29/23 0759 01/30/23 0712 01/31/23 0730 02/02/23 0715 02/03/23 0614  HGB  --   --    < > 8.7* 8.5* 8.7* 8.9* 8.7*  MCV  --   --    < > 98.3 101.4* 100.3* 101.0* 102.8*  VITAMINB12 298  --   --   --   --   --   --   --   FOLATE >40.0  --   --   --   --   --   --   --   FERRITIN  --  19  --   --   --   --   --   --   TIBC  --  276  --   --   --   --   --   --   IRON  --  14*  --   --   --   --   --   --   RETICCTPCT  --  4.6*  --   --   --   --   --   --    < > = values in this interval not displayed.    Mobility: PT eval obtained.  CIR recommended.  Goals of care   Code Status: Full Code     DVT prophylaxis:  apixaban (ELIQUIS) tablet 2.5 mg Start: 02/02/23 1400 Place TED hose Start: 02/02/23 1353 SCD's Start: 01/11/23 0410 apixaban (ELIQUIS) tablet 2.5 mg   Antimicrobials: IV Zosyn Fluid: None Consultants: Vascular surgery, cardiology Family Communication: Husband at bedside  Status is: Inpatient Level of care: Progressive   Dispo: Patient is from: Home              Anticipated d/c is to: Pending clinical course, probably CIR Continue in-hospital care because: Plan for wound VAC change in OR next week.   Scheduled Meds:  acetaminophen  650 mg Oral Q6H   amiodarone  200 mg Oral BID   apixaban  2.5 mg Oral BID   Chlorhexidine Gluconate Cloth  6 each Topical Daily   clopidogrel  75 mg Oral Daily   feeding supplement  237 mL Oral TID BM   fentaNYL (SUBLIMAZE) injection  50 mcg Intravenous Once   furosemide  80 mg Intravenous BID   insulin aspart  0-15 Units Subcutaneous TID WC   lidocaine  1 patch Transdermal Q24H   lidocaine  1 patch Transdermal Q24H   nutrition supplement (JUVEN)  1 packet Oral BID BM   rosuvastatin  10 mg Oral QHS   sodium chloride flush  10-40 mL Intracatheter Q12H   sodium chloride flush  3  mL Intravenous Q12H   sodium chloride flush  3 mL Intravenous Q12H    PRN meds: sodium chloride, acetaminophen, alum & mag hydroxide-simeth, hydrocortisone cream, ipratropium-albuterol, melatonin, nitroGLYCERIN, ondansetron (ZOFRAN) IV, mouth rinse, oxyCODONE, simethicone, sodium chloride flush, sodium chloride flush, traMADol   Infusions:   sodium chloride 250 mL (01/23/23 1235)   milrinone 0.125 mcg/kg/min (02/02/23 1631)   piperacillin-tazobactam (ZOSYN)  IV 3.375 g (02/03/23 1251)    Diet:  Diet Order  Diet regular Room service appropriate? Yes; Fluid consistency: Thin; Fluid restriction: 2000 mL Fluid  Diet effective now                   Antimicrobials: Anti-infectives (From admission, onward)    Start     Dose/Rate Route Frequency Ordered Stop   02/01/23 1700  vancomycin (VANCOCIN) IVPB 1000 mg/200 mL premix  Status:  Discontinued        1,000 mg 200 mL/hr over 60 Minutes Intravenous Every 48 hours 01/30/23 1548 01/31/23 1405   01/31/23 1400  cefTAZidime (FORTAZ) 2 g in sodium chloride 0.9 % 100 mL IVPB  Status:  Discontinued        2 g 200 mL/hr over 30 Minutes Intravenous Every 24 hours 01/30/23 1539 01/30/23 1548   01/31/23 1032  ceFAZolin 1 g / gentamicin 80 mg in NS 500 mL surgical irrigation  Status:  Discontinued          As needed 01/31/23 1033 01/31/23 1046   01/31/23 0600  piperacillin-tazobactam (ZOSYN) IVPB 3.375 g        3.375 g 12.5 mL/hr over 240 Minutes Intravenous Every 8 hours 01/30/23 1606     01/30/23 1800  piperacillin-tazobactam (ZOSYN) IVPB 3.375 g  Status:  Discontinued        3.375 g 12.5 mL/hr over 240 Minutes Intravenous Every 8 hours 01/30/23 1556 01/30/23 1606   01/30/23 1700  piperacillin-tazobactam (ZOSYN) IVPB 3.375 g  Status:  Discontinued        3.375 g 12.5 mL/hr over 240 Minutes Intravenous STAT 01/30/23 1606 01/30/23 1609   01/30/23 1700  cefTAZidime (FORTAZ) 2 g in sodium chloride 0.9 % 100 mL IVPB        2 g 200  mL/hr over 30 Minutes Intravenous  Once 01/30/23 1610 01/30/23 2131   01/30/23 1600  cefTAZidime (FORTAZ) 2 g in sodium chloride 0.9 % 100 mL IVPB  Status:  Discontinued        2 g 200 mL/hr over 30 Minutes Intravenous Every 24 hours 01/30/23 1548 01/30/23 1556   01/30/23 1400  cefTAZidime (FORTAZ) 2 g in sodium chloride 0.9 % 100 mL IVPB  Status:  Discontinued        2 g 200 mL/hr over 30 Minutes Intravenous Every 8 hours 01/30/23 1028 01/30/23 1539   01/30/23 1115  vancomycin (VANCOREADY) IVPB 1500 mg/300 mL  Status:  Discontinued        1,500 mg 150 mL/hr over 120 Minutes Intravenous  Once 01/30/23 1028 01/30/23 1556   01/29/23 2200  ceFAZolin (ANCEF) IVPB 2g/100 mL premix        2 g 200 mL/hr over 30 Minutes Intravenous 2 times daily 01/29/23 1335 01/29/23 2128   01/26/23 0000  vancomycin (VANCOCIN) IVPB 1000 mg/200 mL premix        1,000 mg 200 mL/hr over 60 Minutes Intravenous On call 01/25/23 0844 01/26/23 0944   01/24/23 2200  ceFAZolin (ANCEF) IVPB 2g/100 mL premix  Status:  Discontinued        2 g 200 mL/hr over 30 Minutes Intravenous 2 times daily 01/24/23 1023 01/29/23 1335   01/24/23 0900  ceFAZolin (ANCEF) IVPB 1 g/50 mL premix  Status:  Discontinued        1 g 100 mL/hr over 30 Minutes Intravenous 2 times daily 01/24/23 0823 01/24/23 1023   01/24/23 0430  ceFAZolin (ANCEF) IVPB 1 g/50 mL premix        1 g 100 mL/hr over  30 Minutes Intravenous Every 12 hours 01/23/23 1942 01/24/23 0524   01/23/23 0800  ceFAZolin (ANCEF) IVPB 2g/100 mL premix        2 g 200 mL/hr over 30 Minutes Intravenous On call 01/23/23 0011 01/23/23 1710   01/23/23 0100  gentamicin (GARAMYCIN) 80 mg in sodium chloride 0.9 % 500 mL irrigation        80 mg Irrigation On call 01/23/23 0011 01/23/23 1749   01/10/23 1958  ceFAZolin (ANCEF) IVPB 2 g/50 mL premix        over 30 Minutes  Continuous PRN 01/10/23 1958 01/10/23 2036       Skin assessment:       Nutritional status:  Body mass index is  36.9 kg/m.  Nutrition Problem: Moderate Malnutrition Etiology: acute illness Signs/Symptoms: mild fat depletion, mild muscle depletion, moderate muscle depletion     Objective: Vitals:   02/03/23 0807 02/03/23 1112  BP: 132/65 (!) 128/57  Pulse: 72 70  Resp: 18 18  Temp: 97.9 F (36.6 C) 98.5 F (36.9 C)  SpO2: 100% 100%    Intake/Output Summary (Last 24 hours) at 02/03/2023 1253 Last data filed at 02/03/2023 0559 Gross per 24 hour  Intake 417 ml  Output 850 ml  Net -433 ml   Filed Weights   02/01/23 0500 02/02/23 1616 02/03/23 0556  Weight: 83.1 kg 82.9 kg 91.5 kg   Weight change:  Body mass index is 36.9 kg/m.   Physical Exam: General exam: Pleasant elderly African-American female.  Not in distress. Skin: No rashes, lesions or ulcers.  Congenital ichthyosis HEENT: Atraumatic, normocephalic, no obvious bleeding Lungs: Clear to auscultation bilaterally CVS: Regular rate and rhythm, no murmur GI/Abd soft, nontender, nondistended, bowel sound present CNS: Alert, awake, oriented x 3.  Hard of hearing Psychiatry: Mood appropriate Extremities: Bilateral pedal edema seem slightly improved..  No calf tenderness.  Data Review: I have personally reviewed the laboratory data and studies available.  F/u labs ordered Unresulted Labs (From admission, onward)     Start     Ordered   02/03/23 [id number]  Basic metabolic panel  Daily,   R     Comments: While on Milrinone   Question:  Specimen collection method  Answer:  Lab=Lab collect   02/02/23 1352   02/03/23 0500  Magnesium  Daily,   R     Comments: While on Milrinone   Question:  Specimen collection method  Answer:  Lab=Lab collect   02/02/23 1352   02/03/23 0500  Cooxemetry Panel (carboxy, met, total hgb, O2 sat)  Daily,   R     Question:  Specimen collection method  Answer:  Lab=Lab collect   02/02/23 1352   02/02/23 [id number]  Basic metabolic panel  Daily,   R     Question:  Specimen collection method  Answer:  Lab=Lab  collect   02/01/23 1030   02/02/23 0500  CBC  Daily,   R     Question:  Specimen collection method  Answer:  Lab=Lab collect   02/01/23 1030   01/27/23 1548  Zinc  Once,   R        01/27/23 1548   01/26/23 1112  Acid Fast Culture with reflexed sensitivities  RELEASE UPON ORDERING,   TIMED       Comments: Specimen A: Pre-op diagnosis: RIGHT GROIN WOUND    01/26/23 1112   01/26/23 0500  Magnesium  Daily,   R     Question:  Specimen collection method  Answer:  Lab=Lab collect   01/25/23 0754            Total time spent in review of labs and imaging, patient evaluation, formulation of plan, documentation and communication with family: 25 minutes  Signed, Terrilee Croak, MD [facility] 02/03/2023

## 2023-02-03 NOTE — Progress Notes (Addendum)
  Progress Note    02/03/2023 6:45 AM 3 Days Post-Op  Subjective:  sleeping, wakes easily and no complaints  Tm 99  Vitals:   02/02/23 1900 02/03/23 0556  BP:  130/64  Pulse:  70  Resp:  16  Temp: 99 F (37.2 C) 99 F (37.2 C)  SpO2:      Physical Exam: General:  no distress Lungs:  non labored Incisions:  right groin with vac and continues to have seal this am. Extremities:  right foot is warm and well perfused.  Abdomen:  soft  CBC    Component Value Date/Time   WBC 7.8 02/02/2023 0715   RBC 2.89 (L) 02/02/2023 0715   HGB 8.9 (L) 02/02/2023 0715   HGB 14.2 10/09/2020 0935   HCT 29.2 (L) 02/02/2023 0715   HCT 44.9 10/09/2020 0935   PLT 334 02/02/2023 0715   PLT 379 10/09/2020 0935   MCV 101.0 (H) 02/02/2023 0715   MCV 91 10/09/2020 0935   MCH 30.8 02/02/2023 0715   MCHC 30.5 02/02/2023 0715   RDW 21.2 (H) 02/02/2023 0715   RDW 13.4 10/09/2020 0935   LYMPHSABS 0.9 01/15/2023 0950   LYMPHSABS 1.1 10/09/2020 0935   MONOABS 1.4 (H) 01/15/2023 0950   EOSABS 0.6 (H) 01/15/2023 0950   EOSABS 0.2 10/09/2020 0935   BASOSABS 0.1 01/15/2023 0950   BASOSABS 0.1 10/09/2020 0935    BMET    Component Value Date/Time   NA 143 02/02/2023 0715   NA 145 (H) 07/07/2022 1007   K 3.5 02/02/2023 0715   CL 106 02/02/2023 0715   CO2 27 02/02/2023 0715   GLUCOSE 104 (H) 02/02/2023 0715   BUN 37 (H) 02/02/2023 0715   BUN 24 07/07/2022 1007   CREATININE 1.74 (H) 02/02/2023 0715   CREATININE 1.28 (H) 01/14/2022 1551   CALCIUM 9.2 02/02/2023 0715   GFRNONAA 29 (L) 02/02/2023 0715   GFRAA 49 (L) 10/09/2020 0933    INR    Component Value Date/Time   INR 1.4 (H) 01/10/2023 2145     Intake/Output Summary (Last 24 hours) at 02/03/2023 0645 Last data filed at 02/03/2023 0559 Gross per 24 hour  Intake 777 ml  Output 850 ml  Net -73 ml      Assessment/Plan:  82 y.o. female is s/p:  right groin washout with placement of myriad morcell and wound vac   3 Days  Post-Op   -doing well this morning.  Vac continues to have good seal after additional adhesive placed yesterday.  Orders for ABD pad to right groin to wick moisture every shift and as needed.  Please do not tape in place.  -plan for OR next week to inspect wound and change vac -will place order for dietitian to optimize nutrition -pt with volume overload and poor UOP-transferred to 3E to start Milrinone.   Leontine Locket, PA-C [facility] [phone] 02/03/2023 6:45 AM

## 2023-02-03 NOTE — Progress Notes (Signed)
Physical Therapy Treatment Patient Details Name: Natasha Woodard MRN: [id number] DOB: Dec 22, 1940 Today's Date: 02/03/2023   History of Present Illness 82 yo female admitted 1/30 with chest pain, NSTEMI s/p cardiac cath and IABP placed. Pt with bleeding and impaired circulation with IABP removed 1/31 and thrombin injection to pseudoaneurysm. 2/4 Rt groin pseudoaneurysm rupture s/p repair with placement of wound VAC. Pt with VT arrest on 2/8. ICD placed 2/12. Pt with rt groin washout and debridement and sartorius muscle flap with VAC placement on 2/15. PMhx: CAD, dilated cardiomyopathy, CKD, GERD, HFpEF, HTN, HLD, T2DM    PT Comments    Pt tolerated treatment well today. Pt was able to increase ambulation distance today with +2 help from mobility specialist for safety and equipment. No change in DC/DME recs. Pt would benefit from further gait training during acute stay.  Recommendations for follow up therapy are one component of a multi-disciplinary discharge planning process, led by the attending physician.  Recommendations may be updated based on patient status, additional functional criteria and insurance authorization.  Follow Up Recommendations  Acute inpatient rehab (3hours/day)     Assistance Recommended at Discharge Frequent or constant Supervision/Assistance  Patient can return home with the following A little help with walking and/or transfers;A little help with bathing/dressing/bathroom;Help with stairs or ramp for entrance;Assist for transportation;Assistance with cooking/housework   Equipment Recommendations  Rolling walker (2 wheels)    Recommendations for Other Services       Precautions / Restrictions Precautions Precautions: Fall;ICD/Pacemaker;Other (comment) Precaution Comments: VAC rt groin Restrictions Weight Bearing Restrictions: No     Mobility  Bed Mobility Overal bed mobility: Needs Assistance Bed Mobility: Supine to Sit, Sit to Supine     Supine to  sit: HOB elevated, Min guard Sit to supine: Mod assist, +2 for physical assistance, HOB elevated   General bed mobility comments: increased time, heavy use of rail, HOB up    Transfers Overall transfer level: Needs assistance Equipment used: Rolling walker (2 wheels) Transfers: Sit to/from Stand Sit to Stand: Min assist   Step pivot transfers: Min assist, +2 physical assistance            Ambulation/Gait Ambulation/Gait assistance: Min guard, +2 safety/equipment Gait Distance (Feet): 150 Feet Assistive device: Rolling walker (2 wheels) Gait Pattern/deviations: Step-through pattern, Trunk flexed, Drifts right/left Gait velocity: decr     General Gait Details: Min guard for safety. Dense cues for upright posture and RW management to avoid obstacles. Pt required 2 standing recovery breaks.   Stairs             Wheelchair Mobility    Modified Rankin (Stroke Patients Only)       Balance Overall balance assessment: Mild deficits observed, not formally tested                                          Cognition Arousal/Alertness: Awake/alert Behavior During Therapy: WFL for tasks assessed/performed Overall Cognitive Status: Impaired/Different from baseline Area of Impairment: Memory, Following commands, Problem solving                   Current Attention Level: Sustained Memory: Decreased short-term memory Following Commands: Follows one step commands with increased time Safety/Judgement: Decreased awareness of safety, Decreased awareness of deficits Awareness: Emergent Problem Solving: Slow processing General Comments: pt oriented and follows commands well, decreased problem solving and awareness noted functionally  Exercises      General Comments General comments (skin integrity, edema, etc.): VSS on RA      Pertinent Vitals/Pain Pain Assessment Pain Assessment: No/denies pain    Home Living                           Prior Function            PT Goals (current goals can now be found in the care plan section) Progress towards PT goals: Progressing toward goals    Frequency    Min 3X/week      PT Plan Current plan remains appropriate    Co-evaluation   Reason for Co-Treatment: For patient/therapist safety;Other (comment) (Line and lead management)          AM-PAC PT "6 Clicks" Mobility   Outcome Measure  Help needed turning from your back to your side while in a flat bed without using bedrails?: A Lot Help needed moving from lying on your back to sitting on the side of a flat bed without using bedrails?: A Lot Help needed moving to and from a bed to a chair (including a wheelchair)?: A Little Help needed standing up from a chair using your arms (e.g., wheelchair or bedside chair)?: A Little Help needed to walk in hospital room?: A Little Help needed climbing 3-5 steps with a railing? : Total 6 Click Score: 14    End of Session Equipment Utilized During Treatment: Gait belt Activity Tolerance: Patient tolerated treatment well;Patient limited by fatigue Patient left: with call bell/phone within reach;with family/visitor present;in bed Nurse Communication: Mobility status PT Visit Diagnosis: Unsteadiness on feet (R26.81);Other abnormalities of gait and mobility (R26.89);Muscle weakness (generalized) (M62.81)     Time: [id number] PT Time Calculation (min) (ACUTE ONLY): 37 min  Charges:  $Gait Training: 8-22 mins $Therapeutic Activity: 8-22 mins                     Shelby Mattocks, PT, DPT Acute Rehab Services [id number]    Viann Shove 02/03/2023, 4:15 PM

## 2023-02-03 NOTE — Consult Note (Signed)
   [facility] Inpatient Consult   02/03/2023  Natasha Woodard 10/04/1941 [id number]  [place] Organization [ACO] Patient: [employer]  Primary Care Provider:  Tonia Ghent, MD, with [facility]   Patient unit rounding and screened for hospitalization with noted length of stay [which includes an ICU stay] and to assess for potential [place] Management service needs for post hospital transition for care coordination.  Review of patient's electronic medical record reveals patient is still being considered due to ongoing medical interventions/needs.  Spoke with inpatient [facility] RNCM regarding post hospital barriers and ongoing needs.   Plan:  Continue to follow progress and disposition to assess for post hospital community care coordination/management needs.  Referral request for community care coordination: continue to follow  Of note, [place] does not replace or interfere with any arrangements made by the Inpatient Transition of Care team.  For questions contact:   Natividad Brood, RN BSN [place]  [phone] business mobile phone Toll free office [phone]  *[place]  [phone] Fax number: [phone] [email or web address]

## 2023-02-03 NOTE — Telephone Encounter (Signed)
Device check by [employer].  Per rep RV lead WNL.  Atrial lead at implant threshold 2.5V at 1.0 ms.   Today measured 3.5V at 1.0 ms.    Output reprogrammed to 5V at 1.0 ms for safety.  Sensitivity in atrial lead increased to 1.0 mV.  Will forward for awareness.  Pt currently hospitalized at [facility].

## 2023-02-03 NOTE — Progress Notes (Addendum)
Patient ID: SHUNITA RUCH, female   DOB: Mar 28, 1941, 82 y.o.   MRN: [id number]     Advanced Heart Failure Rounding Note  PCP-Cardiologist: Dr. Ellyn Hack AHF: Dr. 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 Now admitted for acute lateral wall MI 2/2 distal LCx occlusion c/b severe ischemic MR and acute systolic heart failure w/ low output, CI 2.1 on RHC. Required placement of IABP, but later removed given concerns for developing limb ischemia=>found to have Right femoral PSA then ultimate rupture s/p emergent repair. VT arrest 2/8. ICD placed 2/12.  Subjective:   1/31 Underwent thrombin injection to right femoral PSA . Hgb drifting down 10.3>8.3>6.8  2/1 Started on amio drip to suppress PVCs. Got dose of IV lasix x1. CVP low.  2/4 Ruptured PSA. Returned to OR for repair. VAC in place 2/5 Midodrine 5 mg three times daily added.  Back in A fib.  2/7 TEE with mod MR.  2/8 VT arrest--> shock x1 ROSC after 2 minutes. Given lidocaine and amio was increased to 60 mg.  2/9 Transvenous pacemaker placed for overdrive pacing with PMVT.  2/11 Milrinone restarted, Co-ox 49% 2/12 PICC removed. S/p [employer] ICD  2/14 Milrinone stopped . Amio drip stopped.  2/15 Returned to OR for washout and VAC change with sartorious muscle flap.  2/22: Transferred to [place] for milrinone   Went to OR 2/20 for washout and wound vac placement.  Plan for wound vac change in OR next week.  Weights seem inaccurate. Doubt 20lb weight gain. Remains volume overloaded. SCr improving with milrinone. Needs standing weights if tolerated.   Feels fine this morning. Denies CP/SOB.   Wound culture growing morganella and enterococcus. Abx per primary   Objective:   Weight Range: 91.5 kg Body mass index is 36.9 kg/m.   Vital Signs:   Temp:  [98.1 F (36.7 C)-99 F (37.2 C)] 99 F (37.2 C) (02/23 0556) Pulse Rate:  [70] 70 (02/23  0556) Resp:  [16] 16 (02/23 0556) BP: (124-130)/(64-70) 130/64 (02/23 0556) SpO2:  [100 %] 100 % (02/22 1616) Weight:  [82.9 kg-91.5 kg] 91.5 kg (02/23 0556) Last BM Date : 01/30/23  Weight change: Filed Weights   02/01/23 0500 02/02/23 1616 02/03/23 0556  Weight: 83.1 kg 82.9 kg 91.5 kg    Intake/Output:   Intake/Output Summary (Last 24 hours) at 02/03/2023 0856 Last data filed at 02/03/2023 0559 Gross per 24 hour  Intake 777 ml  Output 850 ml  Net -73 ml     Physical Exam  General:  chronically ill / elderly appearing.  No respiratory difficulty HEENT: normal Neck: supple. JVD to jaw. Carotids 2+ bilat; no bruits. No lymphadenopathy or thyromegaly appreciated. Cor: PMI nondisplaced. Regular rate & rhythm. No rubs, gallops or murmurs. Lungs: clear Abdomen: soft, nontender, nondistended. No hepatosplenomegaly. No bruits or masses. Good bowel sounds. Extremities: no cyanosis, clubbing, rash, +2-3 BLE edema. Wound vac R groin Neuro: alert & oriented x 3, cranial nerves grossly intact. moves all 4 extremities w/o difficulty. Affect pleasant.   Telemetry    A paced 60s/70s (Personally reviewed)    Labs    CBC Recent Labs    02/02/23 0715 02/03/23 0614  WBC 7.8 7.4  HGB 8.9* 8.7*  HCT 29.2* 29.8*  MCV 101.0* 102.8*  PLT 334 [id number]   Basic Metabolic Panel Recent Labs    02/02/23 0715 02/03/23 0614  NA 143  142  K 3.5 3.8  CL 106 105  CO2 27 30  GLUCOSE 104* 133*  BUN 37* 36*  CREATININE 1.74* 1.68*  CALCIUM 9.2 9.0  MG 2.0 2.0   BNP: BNP (last 3 results) Recent Labs    08/31/22 1613 01/10/23 1048 02/01/23 1239  BNP 1,178.5* >4,500.0* 914.7*   Imaging   No results found.  Medications:     Scheduled Medications:  acetaminophen  650 mg Oral Q6H   amiodarone  200 mg Oral BID   apixaban  2.5 mg Oral BID   Chlorhexidine Gluconate Cloth  6 each Topical Daily   clopidogrel  75 mg Oral Daily   feeding supplement  237 mL Oral TID BM   fentaNYL  (SUBLIMAZE) injection  50 mcg Intravenous Once   furosemide  80 mg Intravenous BID   insulin aspart  0-15 Units Subcutaneous TID WC   lidocaine  1 patch Transdermal Q24H   lidocaine  1 patch Transdermal Q24H   nutrition supplement (JUVEN)  1 packet Oral BID BM   rosuvastatin  10 mg Oral QHS   sodium chloride flush  10-40 mL Intracatheter Q12H   sodium chloride flush  3 mL Intravenous Q12H   sodium chloride flush  3 mL Intravenous Q12H    Infusions:  sodium chloride 250 mL (01/23/23 1235)   milrinone 0.125 mcg/kg/min (02/02/23 1631)   piperacillin-tazobactam (ZOSYN)  IV 3.375 g (02/03/23 0618)   PRN Medications: sodium chloride, acetaminophen, alum & mag hydroxide-simeth, hydrocortisone cream, ipratropium-albuterol, melatonin, nitroGLYCERIN, ondansetron (ZOFRAN) IV, mouth rinse, oxyCODONE, simethicone, sodium chloride flush, sodium chloride flush, traMADol  Assessment/Plan  1. Cardiogenic shock/acute on chronic systolic CHF: Echo this admission with EF 20-25%, mild LV dilation, mild RV dysfunction, severe MR with restricted posterior leaflet.  Echo in 2/23 with EF 50-55%.  Patient has history of nonischemic cardiomyopathy (?PVC-mediated) that improved.  She reports 2 months of dyspnea then had lateral STEMI 1/30 with chest pain.  PLOM occlusion not thought to explain the extent of her cardiomyopathy, so suspect EF may have been falling prior to this event (symptomatic at least 2 months).  Suspect mixed ischemic/nonischemic cardiomyopathy.  She initially had IABP for cardiogenic shock, now removed due to right leg ischemia. Was weaned off pressors/inotropes but Milrinone restarted 2/11 for low Co-ox and rising SCr. Now off milrinone.  - Continue 80 IV lasix. Slightly better UOP. Continue diuresis + milrinone. Will add 5 mg metolazone x1 today - Had been stable off milrinone. SCr continued to rise and patient had poor response to IV lasix. Transferred to [place] for milrinone infusion. SCr improving  with milrinone 1.62>1.74>1.68.  - Would consider Jardiance as she is getting more active but given groin infection will hold off - additional GDMT initially limited by hypotension and AKI. BP and Scr improving. Tolerating midodrine wean.  - S/p  DDD ICD. Appreciate EP  - Strict I&O, daily weights 2. Mitral regurgitation: severe MR with restricted posterior leaflet on initial echo, suspect infarct-related MR with PLOM occlusion. TEE was later done showing moderate MR.  3. CAD: Lateral STEMI with occluded pLOM, treated with DES.   - ASA stopped yesterday - Switched from Brilinta to plavix. - Continue statin.  4. 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  - Continue amio 200 mg twice a day.   5. Right femoral PSA: s/p thrombin injection by Dr. Carlis Abbott. 2/4 stat u/s with recurrent PSA with active bleeding into PSA. S/p PSA repair with VAC placement.  2/15 VAC change with sartorious muscle flap. - vascular following. Went to OR 2/20 for washout and wound vac placement.  Plan for wound vac change in OR next week. - She is on Zosyn for wound infection.  6. AKI on CKD stage 3b: Baseline 1.3-1.5.  Creatinine trended up to 2.06. Milrinone restarted for low output. SCr plateued 1.9. Back to baseline recently however SCr recently started to trent back up 2/22 + volume overload. Req milrinone again. No improving with inotrope's.  7.  Acute blood loss anemia:  - monitor CBC - monitor closely  8. Atrial fibrillation: Paroxysmal.  - Per EP hold a/c x 1 wk post ICD placement. No heparin  - Remains in SR. - Amiodarone 200 mg bid  - Now back on eliquis 9. VT Arrest: 2/7, DCCV x 1 with CPR, ROSC after 2 minutes.  Polymorphic VT, appeared bradycardia-mediated with intermittent junctional rhythm. - s/p DDD ICD on 2/12 - Continue amiodarone.  Earnie Larsson, AGACNP-BC  02/03/2023   Patient seen and examined with the above-signed Advanced Practice Provider and/or Housestaff. I  personally reviewed laboratory data, imaging studies and relevant notes. I independently examined the patient and formulated the important aspects of the plan. I have edited the note to reflect any of my changes or salient points. I have personally discussed the plan with the patient and/or family.  Started on milrinone yesterday for volume overload not responding to IV diuretics and AKI.   Only mild response to IV lasix but Scr improving  Denies SOB, orthopnea or PND. No f/c.   Weights inaccurate  General:  Elderly  No resp difficulty HEENT: normal Neck: supple.JVP to ear . Carotids 2+ bilat; no bruits. No lymphadenopathy or thryomegaly appreciated. Cor: PMI nondisplaced. Regular rate & rhythm. No rubs, gallops or murmurs. Lungs: clear Abdomen: soft, nontender, nondistended. No hepatosplenomegaly. No bruits or masses. Good bowel sounds. Extremities: no cyanosis, clubbing, rash, 2+ edema Wound vac in R groin Neuro: alert & orientedx3, cranial nerves grossly intact. moves all 4 extremities w/o difficulty. Affect pleasant  She remains markedly volume overloaded with cardiorenal syndrome.  Continue milrinone. Continue lasix 80 IV bid. Give metolazone 5 today. Place Purewick. Place TED hose.   Glori Bickers, MD  10:27 AM

## 2023-02-03 NOTE — Progress Notes (Signed)
Mobility Specialist - Progress Note   02/03/23 1000  Mobility  Activity Transferred to/from BSC;Dangled on edge of bed  Level of Assistance Minimal assist, patient does 75% or more  Assistive Device Front wheel walker  Distance Ambulated (ft) 3 ft  Activity Response Tolerated well  Mobility Referral Yes  $Mobility charge 1 Mobility    Pt received in bed requesting to use BSC. MinA to stand and pivot. Had BM then returned to bed w/ call bell in reach.   [place] Specialist Please contact via SecureChat or Rehab office at [phone]

## 2023-02-03 NOTE — Progress Notes (Signed)
Nutrition Brief Note  Consult received for wound healing. RD has been following patient throughout admission with most recent follow up yesterday.   RD ordered nutrition supplements to support wound healing, in addition to protein containing snacks TID as eating smaller more frequent meals may help optimize PO intake.   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   Will continue to follow up with patient as appropriate. Could consider supplemental nutrition if PO intake continues to remain inadequate after allowing time for trial of interventions added yesterday.  Please reach out if additional nutrition related concerns arise.  Clayborne Dana, RDN, LDN Clinical Nutrition

## 2023-02-03 NOTE — Plan of Care (Signed)
  Problem: Health Behavior/Discharge Planning: Goal: Ability to safely manage health-related needs after discharge will improve Outcome: Progressing   Problem: Fluid Volume: Goal: Ability to maintain a balanced intake and output will improve Outcome: Progressing   Problem: Skin Integrity: Goal: Risk for impaired skin integrity will decrease Outcome: Progressing   Problem: Tissue Perfusion: Goal: Adequacy of tissue perfusion will improve Outcome: Progressing   Problem: Clinical Measurements: Goal: Respiratory complications will improve Outcome: Progressing   Problem: Activity: Goal: Risk for activity intolerance will decrease Outcome: Progressing   Problem: Pain Managment: Goal: General experience of comfort will improve Outcome: Progressing   Problem: Safety: Goal: Ability to remain free from injury will improve Outcome: Progressing   Problem: Skin Integrity: Goal: Risk for impaired skin integrity will decrease Outcome: Progressing

## 2023-02-04 ENCOUNTER — Inpatient Hospital Stay: Payer: Self-pay

## 2023-02-04 DIAGNOSIS — I214 Non-ST elevation (NSTEMI) myocardial infarction: Secondary | ICD-10-CM | POA: Diagnosis not present

## 2023-02-04 LAB — BASIC METABOLIC PANEL
Anion gap: 11 (ref 5–15)
BUN: 38 mg/dL — ABNORMAL HIGH (ref 8–23)
CO2: 29 mmol/L (ref 22–32)
Calcium: 9.3 mg/dL (ref 8.9–10.3)
Chloride: 102 mmol/L (ref 98–111)
Creatinine, Ser: 1.69 mg/dL — ABNORMAL HIGH (ref 0.44–1.00)
GFR, Estimated: 30 mL/min — ABNORMAL LOW (ref >60)
Glucose, Bld: 109 mg/dL — ABNORMAL HIGH (ref 70–99)
Potassium: 3.5 mmol/L (ref 3.5–5.1)
Sodium: 142 mmol/L (ref 135–145)

## 2023-02-04 LAB — COOXEMETRY PANEL
Carboxyhemoglobin: 1.5 % (ref 0.5–1.5)
Methemoglobin: 1.6 % — ABNORMAL HIGH (ref 0.0–1.5)
O2 Saturation: 52.5 %
Total hemoglobin: 10.5 g/dL — ABNORMAL LOW (ref 12.0–16.0)

## 2023-02-04 LAB — CBC
HCT: 30.9 % — ABNORMAL LOW (ref 36.0–46.0)
Hemoglobin: 9.2 g/dL — ABNORMAL LOW (ref 12.0–15.0)
MCH: 30.9 pg (ref 26.0–34.0)
MCHC: 29.8 g/dL — ABNORMAL LOW (ref 30.0–36.0)
MCV: 103.7 fL — ABNORMAL HIGH (ref 80.0–100.0)
Platelets: 334 10*3/uL (ref 150–400)
RBC: 2.98 MIL/uL — ABNORMAL LOW (ref 3.87–5.11)
RDW: 21.9 % — ABNORMAL HIGH (ref 11.5–15.5)
WBC: 7.6 10*3/uL (ref 4.0–10.5)
nRBC: 0.4 % — ABNORMAL HIGH (ref 0.0–0.2)

## 2023-02-04 LAB — MAGNESIUM: Magnesium: 1.8 mg/dL (ref 1.7–2.4)

## 2023-02-04 LAB — GLUCOSE, CAPILLARY
Glucose-Capillary: 92 mg/dL (ref 70–99)
Glucose-Capillary: 140 mg/dL — ABNORMAL HIGH (ref 70–99)
Glucose-Capillary: 175 mg/dL — ABNORMAL HIGH (ref 70–99)

## 2023-02-04 MED ORDER — METOLAZONE 5 MG PO TABS
5.0000 mg | ORAL_TABLET | Freq: Once | ORAL | Status: AC
  Administered 2023-02-04: 5 mg via ORAL
  Filled 2023-02-04: qty 1

## 2023-02-04 MED ORDER — SODIUM CHLORIDE 0.9% FLUSH
10.0000 mL | Freq: Two times a day (BID) | INTRAVENOUS | Status: DC
  Administered 2023-02-04 – 2023-02-08 (×4): 10 mL

## 2023-02-04 MED ORDER — MAGNESIUM SULFATE 2 GM/50ML IV SOLN
2.0000 g | Freq: Once | INTRAVENOUS | Status: AC
  Administered 2023-02-04: 2 g via INTRAVENOUS
  Filled 2023-02-04: qty 50

## 2023-02-04 MED ORDER — SPIRONOLACTONE 12.5 MG HALF TABLET
12.5000 mg | ORAL_TABLET | Freq: Every day | ORAL | Status: DC
  Administered 2023-02-04 – 2023-02-09 (×6): 12.5 mg via ORAL
  Filled 2023-02-04 (×6): qty 1

## 2023-02-04 MED ORDER — POTASSIUM CHLORIDE CRYS ER 20 MEQ PO TBCR
40.0000 meq | EXTENDED_RELEASE_TABLET | Freq: Once | ORAL | Status: AC
  Administered 2023-02-04: 40 meq via ORAL
  Filled 2023-02-04: qty 2

## 2023-02-04 NOTE — Progress Notes (Signed)
Peripherally Inserted Central Catheter Placement  The IV Nurse has discussed with the patient and/or persons authorized to consent for the patient, the purpose of this procedure and the potential benefits and risks involved with this procedure.  The benefits include less needle sticks, lab draws from the catheter, and the patient may be discharged home with the catheter. Risks include, but not limited to, infection, bleeding, blood clot (thrombus formation), and puncture of an artery; nerve damage and irregular heartbeat and possibility to perform a PICC exchange if needed/ordered by physician.  Alternatives to this procedure were also discussed.  Bard Power PICC patient education guide, fact sheet on infection prevention and patient information card has been provided to patient /or left at bedside. Consent signed by husband per pt request.   PICC Placement Documentation  PICC Double Lumen 02/04/23 Right Brachial 40 cm 0 cm (Active)  Indication for Insertion or Continuance of Line Vasoactive infusions 02/04/23 1309  Exposed Catheter (cm) 1 cm 02/04/23 1309  Site Assessment Clean, Dry, Intact 02/04/23 1309  Lumen #1 Status Saline locked;Flushed;Blood return noted 02/04/23 1309  Lumen #2 Status Saline locked;Flushed;Blood return noted 02/04/23 1309  Dressing Type Transparent;Securing device 02/04/23 1309  Dressing Status Antimicrobial disc in place;Clean, Dry, Intact 02/04/23 1309  Safety Lock Not Applicable [id number]  Line Care Connections checked and tightened 02/04/23 1309  Line Adjustment (NICU/IV Team Only) No 02/04/23 1309  Dressing Intervention New dressing 02/04/23 1309  Dressing Change Due 02/11/23 02/04/23 1309       Rosalio Macadamia Chenice 02/04/2023, 1:10 PM

## 2023-02-04 NOTE — Progress Notes (Signed)
PROGRESS NOTE  Natasha Woodard  DOB: 1941-08-13  PCP: Tonia Ghent, MD [id number]  DOA: 01/10/2023  LOS: 24 days  Hospital Day: 26  Brief narrative: Natasha Woodard is a 82 y.o. female with PMH significant for DM2, HTN, HLD, nonischemic cardiomyopathy secondary to PVCs suppressed on low-dose amiodarone with ultimate recovery of EF. 1/30, patient presented to the ED with progressively worsening chest pain, worsening shortness of breath.  Troponin went up to over 2000.  She was given IV Lasix, stat echo showed EF of 20 to 25% with wall motion abnormalities.  She underwent urgent cardiac catheterization.  She was found to have acute lateral wall MI secondary to distal LCx occlusion complicated by severe ischemic MR and acute systolic heart failure with low output (EF 20 to 25%).  She required placement of IABP which was later removed given concern of developing limb ischemia.  1/31, she was found to have right femoral pseudoaneurysm was ultimately ruptured for which she required emergent repair on 2/4. Postcardiac cath, she was started on amiodarone drip to suppress PVCs. 2/8, she had VT arrest, ROSC achieved after a shock.  She was started on milrinone drip. 2/12, underwent ICD placement. 2/14, milrinone and amiodarone stopped. 2/15, returned to OR for washout and VAC change with sartorious muscle flap.  2/17: Transferred from heart failure team to [facility] service 2/20: Back to the OR for right groin washout, debridement, wound VAC replacement 2/22: Milrinone drip started again.  Subjective: Patient was seen and examined this morning. ***  Assessment and plan: Acute lateral wall MI  Presented with progressively worsening shortness of breath noted distal occlusion of the circumflex marginal vessel complicated by severe ischemic MR and acute systolic CHF treated with DES.   Continue Eliquis, plavix and statin.  Cardiogenic shock Severe ischemic MR and acute systolic CHF Echo [id number]  w/ EF 20-25%, mildly reduced RV function, severe MR During cardiac cath, patient developed cardiogenic shock, initially required IABP which was later removed given concern of developing limb ischemia. TEE 2/7 confirmed severe MR ICD placement 2/12 Currently on IV Lasix and milrinone drip.  Given a dose of metolazone yesterday.  Noted a plan to add Aldactone today. Heart failure team following.  Paroxysmal atrial fibrillation VT arrest 2/7 S/p DCCV x 1 with CPR, ROSC after 2 minutes.  Polymorphic VT, appeared bradycardia-mediated with intermittent junctional rhythm. s/p DDD ICD on 2/12 Currently on amiodarone 200 mg twice daily. 2/21, cardiovascular surgery okay with reinitiation of Eliquis.   Right femoral pseudoaneurysm Right groin wound, secondary infection Patient developed pseudoaneurysm following balloon pump, s/p thrombin injection by Dr. Carlis Abbott 1/31.  2/4 stat u/s showed recurrent PSA with active bleeding into PSA. S/p PSA repair with VAC placement.  2/15 VAC change with sartorious muscle flap.  2/20, went to the OR for washout and wound VAC placement. Noted a plan from vascular surgery to change VAC in OR on Monday. Wound culture showed polymicrobial growth showing moderate Morganella and Enterococcus with few  pseudomonas. Currently on IV Zosyn.  Continue antibiotics for another 1 week.   AKI on CKD stage 3b Baseline creatinine 1.3-1.5.  Creatinine peaked at 2.06 on 2/13.  Improving.  Stable in last 24 hours on diuretics. Recent Labs    01/26/23 0441 01/27/23 0244 01/28/23 0042 01/29/23 0759 01/30/23 0712 01/31/23 0730 02/01/23 0805 02/02/23 0715 02/03/23 0614 02/04/23 0051  BUN 38* 44* 45* 41* 37* 32* 37* 37* 36* 38*  CREATININE 1.80* 1.89* 1.66* 1.52* 1.47* 1.34* 1.62* 1.74*  1.68* 1.69*     Chronic anemia Severe iron deficiency Secondary to blood loss and iron deficiency given IV iron 2/18 Hemoglobin trend as below.  Stable above 8 for last 1 week. Recent Labs     01/13/23 0857 01/13/23 0858 01/13/23 1959 01/30/23 0712 01/31/23 0730 02/02/23 0715 02/03/23 0614 02/04/23 0051  HGB  --   --    < > 8.5* 8.7* 8.9* 8.7* 9.2*  MCV  --   --    < > 101.4* 100.3* 101.0* 102.8* 103.7*  VITAMINB12 298  --   --   --   --   --   --   --   FOLATE >40.0  --   --   --   --   --   --   --   FERRITIN  --  19  --   --   --   --   --   --   TIBC  --  276  --   --   --   --   --   --   IRON  --  14*  --   --   --   --   --   --   RETICCTPCT  --  4.6*  --   --   --   --   --   --    < > = values in this interval not displayed.     Mobility: PT eval obtained.  CIR recommended.  Goals of care   Code Status: Full Code     DVT prophylaxis:  apixaban (ELIQUIS) tablet 2.5 mg Start: 02/02/23 1400 Place TED hose Start: 02/02/23 1353 SCD's Start: 01/11/23 0410 apixaban (ELIQUIS) tablet 2.5 mg   Antimicrobials: IV Zosyn Fluid: None Consultants: Vascular surgery, cardiology Family Communication: Husband at bedside  Status is: Inpatient Level of care: Progressive   Dispo: Patient is from: Home              Anticipated d/c is to: Pending clinical course, probably CIR Continue in-hospital care because: Plan for wound VAC change in OR next week.   Scheduled Meds:  acetaminophen  650 mg Oral Q6H   amiodarone  200 mg Oral BID   apixaban  2.5 mg Oral BID   Chlorhexidine Gluconate Cloth  6 each Topical Daily   clopidogrel  75 mg Oral Daily   feeding supplement  237 mL Oral TID BM   fentaNYL (SUBLIMAZE) injection  50 mcg Intravenous Once   furosemide  80 mg Intravenous BID   insulin aspart  0-15 Units Subcutaneous TID WC   lidocaine  1 patch Transdermal Q24H   lidocaine  1 patch Transdermal Q24H   metolazone  5 mg Oral Once   nutrition supplement (JUVEN)  1 packet Oral BID BM   potassium chloride  40 mEq Oral Once   rosuvastatin  10 mg Oral QHS   sodium chloride flush  10-40 mL Intracatheter Q12H   sodium chloride flush  3 mL Intravenous Q12H   sodium  chloride flush  3 mL Intravenous Q12H   spironolactone  12.5 mg Oral Daily    PRN meds: sodium chloride, acetaminophen, alum & mag hydroxide-simeth, hydrocortisone cream, ipratropium-albuterol, melatonin, nitroGLYCERIN, ondansetron (ZOFRAN) IV, mouth rinse, oxyCODONE, simethicone, sodium chloride flush, sodium chloride flush, traMADol   Infusions:   sodium chloride 250 mL (01/23/23 1235)   magnesium sulfate bolus IVPB     milrinone 0.125 mcg/kg/min (02/04/23 0104)   piperacillin-tazobactam (ZOSYN)  IV 3.375  g (02/04/23 [id number])    Diet:  Diet Order             Diet regular Room service appropriate? Yes; Fluid consistency: Thin; Fluid restriction: 2000 mL Fluid  Diet effective now                   Antimicrobials: Anti-infectives (From admission, onward)    Start     Dose/Rate Route Frequency Ordered Stop   02/01/23 1700  vancomycin (VANCOCIN) IVPB 1000 mg/200 mL premix  Status:  Discontinued        1,000 mg 200 mL/hr over 60 Minutes Intravenous Every 48 hours 01/30/23 1548 01/31/23 1405   01/31/23 1400  cefTAZidime (FORTAZ) 2 g in sodium chloride 0.9 % 100 mL IVPB  Status:  Discontinued        2 g 200 mL/hr over 30 Minutes Intravenous Every 24 hours 01/30/23 1539 01/30/23 1548   01/31/23 1032  ceFAZolin 1 g / gentamicin 80 mg in NS 500 mL surgical irrigation  Status:  Discontinued          As needed 01/31/23 1033 01/31/23 1046   01/31/23 0600  piperacillin-tazobactam (ZOSYN) IVPB 3.375 g        3.375 g 12.5 mL/hr over 240 Minutes Intravenous Every 8 hours 01/30/23 1606     01/30/23 1800  piperacillin-tazobactam (ZOSYN) IVPB 3.375 g  Status:  Discontinued        3.375 g 12.5 mL/hr over 240 Minutes Intravenous Every 8 hours 01/30/23 1556 01/30/23 1606   01/30/23 1700  piperacillin-tazobactam (ZOSYN) IVPB 3.375 g  Status:  Discontinued        3.375 g 12.5 mL/hr over 240 Minutes Intravenous STAT 01/30/23 1606 01/30/23 1609   01/30/23 1700  cefTAZidime (FORTAZ) 2 g in sodium  chloride 0.9 % 100 mL IVPB        2 g 200 mL/hr over 30 Minutes Intravenous  Once 01/30/23 1610 01/30/23 2131   01/30/23 1600  cefTAZidime (FORTAZ) 2 g in sodium chloride 0.9 % 100 mL IVPB  Status:  Discontinued        2 g 200 mL/hr over 30 Minutes Intravenous Every 24 hours 01/30/23 1548 01/30/23 1556   01/30/23 1400  cefTAZidime (FORTAZ) 2 g in sodium chloride 0.9 % 100 mL IVPB  Status:  Discontinued        2 g 200 mL/hr over 30 Minutes Intravenous Every 8 hours 01/30/23 1028 01/30/23 1539   01/30/23 1115  vancomycin (VANCOREADY) IVPB 1500 mg/300 mL  Status:  Discontinued        1,500 mg 150 mL/hr over 120 Minutes Intravenous  Once 01/30/23 1028 01/30/23 1556   01/29/23 2200  ceFAZolin (ANCEF) IVPB 2g/100 mL premix        2 g 200 mL/hr over 30 Minutes Intravenous 2 times daily 01/29/23 1335 01/29/23 2128   01/26/23 0000  vancomycin (VANCOCIN) IVPB 1000 mg/200 mL premix        1,000 mg 200 mL/hr over 60 Minutes Intravenous On call 01/25/23 0844 01/26/23 0944   01/24/23 2200  ceFAZolin (ANCEF) IVPB 2g/100 mL premix  Status:  Discontinued        2 g 200 mL/hr over 30 Minutes Intravenous 2 times daily 01/24/23 1023 01/29/23 1335   01/24/23 0900  ceFAZolin (ANCEF) IVPB 1 g/50 mL premix  Status:  Discontinued        1 g 100 mL/hr over 30 Minutes Intravenous 2 times daily 01/24/23 0823 01/24/23 1023  01/24/23 0430  ceFAZolin (ANCEF) IVPB 1 g/50 mL premix        1 g 100 mL/hr over 30 Minutes Intravenous Every 12 hours 01/23/23 1942 01/24/23 0524   01/23/23 0800  ceFAZolin (ANCEF) IVPB 2g/100 mL premix        2 g 200 mL/hr over 30 Minutes Intravenous On call 01/23/23 0011 01/23/23 1710   01/23/23 0100  gentamicin (GARAMYCIN) 80 mg in sodium chloride 0.9 % 500 mL irrigation        80 mg Irrigation On call 01/23/23 0011 01/23/23 1749   01/10/23 1958  ceFAZolin (ANCEF) IVPB 2 g/50 mL premix        over 30 Minutes  Continuous PRN 01/10/23 1958 01/10/23 2036       Skin assessment:        Nutritional status:  Body mass index is 33.35 kg/m.  Nutrition Problem: Moderate Malnutrition Etiology: acute illness Signs/Symptoms: mild fat depletion, mild muscle depletion, moderate muscle depletion  {Tip this will not be part of the note when signed Body mass index is 33.35 kg/m. ,  Nutrition Documentation    Flowsheet Row ED to Hosp-Admission (Current) from 01/10/2023 in [facility]  Nutrition Problem Moderate Malnutrition  Etiology acute illness  Nutrition Goal Patient will meet greater than or equal to 90% of their needs  Interventions Ensure Enlive (each supplement provides 350kcal and 20 grams of protein), Refer to RD note for recommendations, Liberalize Diet, MVI, Education     ,  (Optional):26781}   Objective: Vitals:   02/04/23 0945 02/04/23 1102  BP: (!) 148/68 138/70  Pulse: 72 71  Resp: 19   Temp: 98.3 F (36.8 C) 98 F (36.7 C)  SpO2: 99% 97%    Intake/Output Summary (Last 24 hours) at 02/04/2023 1138 Last data filed at 02/04/2023 0104 Gross per 24 hour  Intake 221.4 ml  Output 1550 ml  Net -1328.6 ml    Filed Weights   02/03/23 0556 02/03/23 2351 02/04/23 0500  Weight: 91.5 kg 82.7 kg 82.7 kg   Weight change: -0.2 kg Body mass index is 33.35 kg/m.   Physical Exam: General exam: Pleasant elderly African-American female.  Not in distress. Skin: No rashes, lesions or ulcers.  Congenital ichthyosis HEENT: Atraumatic, normocephalic, no obvious bleeding Lungs: Clear to auscultation bilaterally CVS: Regular rate and rhythm, no murmur GI/Abd soft, nontender, nondistended, bowel sound present CNS: Alert, awake, oriented x 3.  Hard of hearing Psychiatry: Mood appropriate Extremities: Bilateral pedal edema seem slightly improved..  No calf tenderness.  Data Review: I have personally reviewed the laboratory data and studies available.  F/u labs ordered Unresulted Labs (From admission, onward)     Start     Ordered    02/03/23 [id number]  Basic metabolic panel  Daily,   R     Comments: While on Milrinone   Question:  Specimen collection method  Answer:  Lab=Lab collect   02/02/23 1352   02/03/23 0500  Magnesium  Daily,   R     Comments: While on Milrinone   Question:  Specimen collection method  Answer:  Lab=Lab collect   02/02/23 1352   02/03/23 0500  Cooxemetry Panel (carboxy, met, total hgb, O2 sat)  Daily,   R     Question:  Specimen collection method  Answer:  Lab=Lab collect   02/02/23 1352   02/02/23 [id number]  Basic metabolic panel  Daily,   R     Question:  Specimen collection method  Answer:  Lab=Lab collect   02/01/23 1030   02/02/23 0500  CBC  Daily,   R     Question:  Specimen collection method  Answer:  Lab=Lab collect   02/01/23 1030   01/26/23 1112  Acid Fast Culture with reflexed sensitivities  RELEASE UPON ORDERING,   TIMED       Comments: Specimen A: Pre-op diagnosis: RIGHT GROIN WOUND    01/26/23 1112   01/26/23 0500  Magnesium  Daily,   R     Question:  Specimen collection method  Answer:  Lab=Lab collect   01/25/23 0754            Total time spent in review of labs and imaging, patient evaluation, formulation of plan, documentation and communication with family: 25 minutes  Signed, Terrilee Croak, MD [facility] 02/04/2023

## 2023-02-04 NOTE — Progress Notes (Signed)
Patient ID: Natasha Woodard, female   DOB: 09/09/1941, 82 y.o.   MRN: [id number]     Advanced Heart Failure Rounding Note  PCP-Cardiologist: Dr. Ellyn Hack AHF: Dr. 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 Now admitted for acute lateral wall MI 2/2 distal LCx occlusion c/b severe ischemic MR and acute systolic heart failure w/ low output, CI 2.1 on RHC. Required placement of IABP, but later removed given concerns for developing limb ischemia=>found to have Right femoral PSA then ultimate rupture s/p emergent repair. VT arrest 2/8. ICD placed 2/12.  Subjective:   1/31 Underwent thrombin injection to right femoral PSA . Hgb drifting down 10.3>8.3>6.8  2/1 Started on amio drip to suppress PVCs. Got dose of IV lasix x1. CVP low.  2/4 Ruptured PSA. Returned to OR for repair. VAC in place 2/5 Midodrine 5 mg three times daily added.  Back in A fib.  2/7 TEE with mod MR.  2/8 VT arrest--> shock x1 ROSC after 2 minutes. Given lidocaine and amio was increased to 60 mg.  2/9 Transvenous pacemaker placed for overdrive pacing with PMVT.  2/11 Milrinone restarted, Co-ox 49% 2/12 PICC removed. S/p [employer] ICD  2/14 Milrinone stopped . Amio drip stopped.  2/15 Returned to OR for washout and VAC change with sartorious muscle flap.    Went to OR 2/20 for washout and wound vac placement.  Plan for wound vac change in OR Monday.   She remains on milrinone 0.125, some diuresis yesterday with creatinine 1.69 (stable) today.   Wound culture growing morganella and enterococcus. She is on Zosyn.    Objective:   Weight Range: 82.7 kg Body mass index is 33.35 kg/m.   Vital Signs:   Temp:  [98.3 F (36.8 C)-98.5 F (36.9 C)] 98.3 F (36.8 C) (02/24 0945) Pulse Rate:  [69-72] 72 (02/24 0945) Resp:  [18-19] 19 (02/24 0945) BP: (128-148)/(57-68) 148/68 (02/24 0945) SpO2:  [98 %-100 %] 99 % (02/24 0945) Weight:   [82.7 kg] 82.7 kg (02/24 0500) Last BM Date : 02/04/23  Weight change: Filed Weights   02/03/23 0556 02/03/23 2351 02/04/23 0500  Weight: 91.5 kg 82.7 kg 82.7 kg    Intake/Output:   Intake/Output Summary (Last 24 hours) at 02/04/2023 1016 Last data filed at 02/04/2023 0104 Gross per 24 hour  Intake 221.4 ml  Output 1550 ml  Net -1328.6 ml     Physical Exam   General: NAD Neck: JVP 9-10 cm, no thyromegaly or thyroid nodule.  Lungs: Clear to auscultation bilaterally with normal respiratory effort. CV: Nondisplaced PMI.  Heart regular S1/S2, no S3/S4, no murmur.  1+ ankle edema.   Abdomen: Soft, nontender, no hepatosplenomegaly, no distention.  Skin: Intact without lesions or rashes.  Neurologic: Alert and oriented x 3.  Psych: Normal affect. Extremities: No clubbing or cyanosis.  HEENT: Normal.    Telemetry    A paced 70s (Personally reviewed)    Labs    CBC Recent Labs    02/03/23 0614 02/04/23 0051  WBC 7.4 7.6  HGB 8.7* 9.2*  HCT 29.8* 30.9*  MCV 102.8* 103.7*  PLT 328 [id number]   Basic Metabolic Panel Recent Labs    02/03/23 0614 02/04/23 0051  NA 142 142  K 3.8 3.5  CL 105 102  CO2 30 29  GLUCOSE 133* 109*  BUN 36* 38*  CREATININE 1.68* 1.69*  CALCIUM 9.0 9.3  MG 2.0 1.8   BNP: BNP (last 3 results) Recent Labs    08/31/22 1613 01/10/23 1048 02/01/23 1239  BNP 1,178.5* >4,500.0* 914.7*   Imaging   [place] EKG SITE RITE  Result Date: 02/04/2023 If Site Rite image not attached, placement could not be confirmed due to current cardiac rhythm.   Medications:     Scheduled Medications:  acetaminophen  650 mg Oral Q6H   amiodarone  200 mg Oral BID   apixaban  2.5 mg Oral BID   Chlorhexidine Gluconate Cloth  6 each Topical Daily   clopidogrel  75 mg Oral Daily   feeding supplement  237 mL Oral TID BM   fentaNYL (SUBLIMAZE) injection  50 mcg Intravenous Once   furosemide  80 mg Intravenous BID   insulin aspart  0-15 Units Subcutaneous TID WC    lidocaine  1 patch Transdermal Q24H   lidocaine  1 patch Transdermal Q24H   metolazone  5 mg Oral Once   nutrition supplement (JUVEN)  1 packet Oral BID BM   rosuvastatin  10 mg Oral QHS   sodium chloride flush  10-40 mL Intracatheter Q12H   sodium chloride flush  3 mL Intravenous Q12H   sodium chloride flush  3 mL Intravenous Q12H    Infusions:  sodium chloride 250 mL (01/23/23 1235)   milrinone 0.125 mcg/kg/min (02/04/23 0104)   piperacillin-tazobactam (ZOSYN)  IV 3.375 g (02/04/23 0523)   PRN Medications: sodium chloride, acetaminophen, alum & mag hydroxide-simeth, hydrocortisone cream, ipratropium-albuterol, melatonin, nitroGLYCERIN, ondansetron (ZOFRAN) IV, mouth rinse, oxyCODONE, simethicone, sodium chloride flush, sodium chloride flush, traMADol  Assessment/Plan  1. Cardiogenic shock/acute on chronic systolic CHF: Echo this admission with EF 20-25%, mild LV dilation, mild RV dysfunction, severe MR with restricted posterior leaflet.  Echo in 2/23 with EF 50-55%.  Patient has history of nonischemic cardiomyopathy (?PVC-mediated) that improved.  She reports 2 months of dyspnea then had lateral STEMI 1/30 with chest pain.  PLOM occlusion not thought to explain the extent of her cardiomyopathy, so suspect EF may have been falling prior to this event (symptomatic at least 2 months).  Suspect mixed ischemic/nonischemic cardiomyopathy. She initially had IABP for cardiogenic shock, now removed due to right leg ischemia. Was weaned off pressors/inotropes but Milrinone restarted 2/23 with volume overload and poor diuresis.  Some diuresis yesterday, creatinine stable at 1.69.  Still suspect mild volume overload at least and weight not down.  - Continue Lasix 80 mg IV bid and will give metolazone 5 mg po x 1 again today.  - Continue milrinone 0.125.   - Can add spironolactone 12.5 daily.   - Hold off on SGLT2i given groin infection.  - additional GDMT initially limited by hypotension and AKI, now  off midodrine. .  - S/p  DDD ICD.  - Strict I&O, daily weights 2. Mitral regurgitation: severe MR with restricted posterior leaflet on initial echo, suspect infarct-related MR with PLOM occlusion. TEE was later done showing moderate MR.  3. CAD: Lateral STEMI with occluded pLOM, treated with DES.   - Off ASA given apixaban use.  - Switched from Brilinta to plavix.  - Continue statin.  4. 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  - Continue amio 200 mg twice a day.   5. Right femoral PSA: s/p thrombin injection by Dr. Carlis Abbott. 2/4 stat u/s with recurrent PSA with active bleeding into PSA. S/p PSA repair with VAC placement. 2/15 VAC change with sartorious muscle flap.  Vascular  following. Went to OR 2/20 for washout and wound vac placement.  Plan for wound vac change in OR Monday with Dr. Virl Cagey.  - She is on Zosyn for wound infection.  6. AKI on CKD stage 3b: Baseline 1.3-1.5.  Creatinine trended up to 2.06. Milrinone restarted for low output. SCr back to 1.69 today.  7.  Acute blood loss anemia:  Hgb stable.  8. Atrial fibrillation: Paroxysmal. She is in NSR.  - Amiodarone 200 mg bid  - Continue apixaban 2.5 bid.  9. VT Arrest: 2/7, DCCV x 1 with CPR, ROSC after 2 minutes.  Polymorphic VT, appeared bradycardia-mediated with intermittent junctional rhythm. - s/p DDD ICD on 2/12 - Continue amiodarone.  Loralie Champagne 02/04/2023

## 2023-02-04 NOTE — Progress Notes (Signed)
  Progress Note    02/04/2023 9:15 AM 4 Days Post-Op  Subjective:  slept well, smiling   Vitals:   02/03/23 2351 02/04/23 0621  BP: 131/64 133/68  Pulse: 69 70  Resp: 18 18  Temp: 98.4 F (36.9 C) 98.4 F (36.9 C)  SpO2: 100% 100%    Physical Exam: General:  no distress Lungs:  non labored Incisions:  right groin with vac and continues to have seal this am. Extremities:  right foot is warm and well perfused.  Abdomen:  soft  CBC    Component Value Date/Time   WBC 7.6 02/04/2023 0051   RBC 2.98 (L) 02/04/2023 0051   HGB 9.2 (L) 02/04/2023 0051   HGB 14.2 10/09/2020 0935   HCT 30.9 (L) 02/04/2023 0051   HCT 44.9 10/09/2020 0935   PLT 334 02/04/2023 0051   PLT 379 10/09/2020 0935   MCV 103.7 (H) 02/04/2023 0051   MCV 91 10/09/2020 0935   MCH 30.9 02/04/2023 0051   MCHC 29.8 (L) 02/04/2023 0051   RDW 21.9 (H) 02/04/2023 0051   RDW 13.4 10/09/2020 0935   LYMPHSABS 0.9 01/15/2023 0950   LYMPHSABS 1.1 10/09/2020 0935   MONOABS 1.4 (H) 01/15/2023 0950   EOSABS 0.6 (H) 01/15/2023 0950   EOSABS 0.2 10/09/2020 0935   BASOSABS 0.1 01/15/2023 0950   BASOSABS 0.1 10/09/2020 0935    BMET    Component Value Date/Time   NA 142 02/04/2023 0051   NA 145 (H) 07/07/2022 1007   K 3.5 02/04/2023 0051   CL 102 02/04/2023 0051   CO2 29 02/04/2023 0051   GLUCOSE 109 (H) 02/04/2023 0051   BUN 38 (H) 02/04/2023 0051   BUN 24 07/07/2022 1007   CREATININE 1.69 (H) 02/04/2023 0051   CREATININE 1.28 (H) 01/14/2022 1551   CALCIUM 9.3 02/04/2023 0051   GFRNONAA 30 (L) 02/04/2023 0051   GFRAA 49 (L) 10/09/2020 0933    INR    Component Value Date/Time   INR 1.4 (H) 01/10/2023 2145     Intake/Output Summary (Last 24 hours) at 02/04/2023 0915 Last data filed at 02/04/2023 0104 Gross per 24 hour  Intake 221.4 ml  Output 1550 ml  Net -1328.6 ml       Assessment/Plan:  82 y.o. female is s/p:  right groin washout with placement of myriad morcell and wound vac   4 Days  Post-Op   - Doing well this morning.  Vac continues to have good seal after additional adhesive placed Thursday. -remains volume overloaded with VAC output roughly 200/day. The wound will not heal without vacuum assistance with this amount of output.   VAC needs new canister  ABD pad need to be in right groin at all times to wick moisture and provide padding to the site  Plan for OR Monday for VAC change.  Please keep the heals off of the bed to prevent pressure induced ulceration. Pillow under the calves works well.      Broadus John  [facility] [phone] 02/04/2023 9:15 AM

## 2023-02-04 NOTE — Plan of Care (Signed)
  Problem: Education: Goal: Understanding of CV disease, CV risk reduction, and recovery process will improve Outcome: Progressing Goal: Individualized Educational Video(s) Outcome: Progressing   Problem: Activity: Goal: Ability to return to baseline activity level will improve Outcome: Progressing   Problem: Cardiovascular: Goal: Ability to achieve and maintain adequate cardiovascular perfusion will improve Outcome: Progressing Goal: Vascular access site(s) Level 0-1 will be maintained Outcome: Progressing   Problem: Health Behavior/Discharge Planning: Goal: Ability to safely manage health-related needs after discharge will improve Outcome: Progressing   Problem: Education: Goal: Understanding of cardiac disease, CV risk reduction, and recovery process will improve Outcome: Progressing Goal: Individualized Educational Video(s) Outcome: Progressing   Problem: Health Behavior/Discharge Planning: Goal: Ability to safely manage health-related needs after discharge will improve Outcome: Progressing   Problem: Cardiac: Goal: Ability to achieve and maintain adequate cardiovascular perfusion will improve Outcome: Progressing   Problem: Activity: Goal: Ability to tolerate increased activity will improve Outcome: Progressing   Problem: Education: Goal: Ability to describe self-care measures that may prevent or decrease complications (Diabetes Survival Skills Education) will improve Outcome: Progressing Goal: Individualized Educational Video(s) Outcome: Progressing

## 2023-02-04 NOTE — Progress Notes (Signed)
CARDIAC REHAB PHASE I   PRE:  Rate/Rhythm: 90 paced  BP:  Sitting: 127/81      SaO2: 96 RA  MODE:  Ambulation: 220 ft   POST:  Rate/Rhythm: 121 ST paced  BP:  Sitting: 143/72      SaO2: 100 RA  Pt tolerated exercise fair and amb 220 ft with rollator, and moderate assist x2. Pt denies CP, SOB, or dizziness throughout walk. Pt took 1 rest break during walk. Pt in bed and notified nurse tech. Will continue to follow.  [id number] Sheppard Plumber, MS, ACSM-CEP 02/04/2023 12:00 PM

## 2023-02-05 DIAGNOSIS — I214 Non-ST elevation (NSTEMI) myocardial infarction: Secondary | ICD-10-CM | POA: Diagnosis not present

## 2023-02-05 LAB — CBC
HCT: 30.6 % — ABNORMAL LOW (ref 36.0–46.0)
Hemoglobin: 9.5 g/dL — ABNORMAL LOW (ref 12.0–15.0)
MCH: 31.4 pg (ref 26.0–34.0)
MCHC: 31 g/dL (ref 30.0–36.0)
MCV: 101 fL — ABNORMAL HIGH (ref 80.0–100.0)
Platelets: 337 10*3/uL (ref 150–400)
RBC: 3.03 MIL/uL — ABNORMAL LOW (ref 3.87–5.11)
RDW: 21.9 % — ABNORMAL HIGH (ref 11.5–15.5)
WBC: 7.6 10*3/uL (ref 4.0–10.5)
nRBC: 0.3 % — ABNORMAL HIGH (ref 0.0–0.2)

## 2023-02-05 LAB — BASIC METABOLIC PANEL
Anion gap: 11 (ref 5–15)
BUN: 38 mg/dL — ABNORMAL HIGH (ref 8–23)
CO2: 35 mmol/L — ABNORMAL HIGH (ref 22–32)
Calcium: 9.8 mg/dL (ref 8.9–10.3)
Chloride: 95 mmol/L — ABNORMAL LOW (ref 98–111)
Creatinine, Ser: 1.49 mg/dL — ABNORMAL HIGH (ref 0.44–1.00)
GFR, Estimated: 35 mL/min — ABNORMAL LOW (ref >60)
Glucose, Bld: 115 mg/dL — ABNORMAL HIGH (ref 70–99)
Potassium: 3.5 mmol/L (ref 3.5–5.1)
Sodium: 141 mmol/L (ref 135–145)

## 2023-02-05 LAB — GLUCOSE, CAPILLARY
Glucose-Capillary: 109 mg/dL — ABNORMAL HIGH (ref 70–99)
Glucose-Capillary: 184 mg/dL — ABNORMAL HIGH (ref 70–99)
Glucose-Capillary: 85 mg/dL (ref 70–99)
Glucose-Capillary: 147 mg/dL — ABNORMAL HIGH (ref 70–99)

## 2023-02-05 LAB — COOXEMETRY PANEL
Carboxyhemoglobin: 3 % — ABNORMAL HIGH (ref 0.5–1.5)
Methemoglobin: 1 % (ref 0.0–1.5)
O2 Saturation: 71.8 %
Total hemoglobin: 10.7 g/dL — ABNORMAL LOW (ref 12.0–16.0)

## 2023-02-05 LAB — MAGNESIUM: Magnesium: 2 mg/dL (ref 1.7–2.4)

## 2023-02-05 NOTE — Progress Notes (Signed)
OR tomorrow for VAC change.  Please make NPO midnight.    Broadus John MD

## 2023-02-05 NOTE — Progress Notes (Signed)
Patient ID: Natasha Woodard, female   DOB: [birth year], 82 y.o.   MRN: [id number]     Advanced Heart Failure Rounding Note  PCP-Cardiologist: Dr. Ellyn Hack AHF: Dr. 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 Now admitted for acute lateral wall MI 2/2 distal LCx occlusion c/b severe ischemic MR and acute systolic heart failure w/ low output, CI 2.1 on RHC. Required placement of IABP, but later removed given concerns for developing limb ischemia=>found to have Right femoral PSA then ultimate rupture s/p emergent repair. VT arrest 2/8. ICD placed 2/12.  Subjective:   1/31 Underwent thrombin injection to right femoral PSA . Hgb drifting down 10.3>8.3>6.8  2/1 Started on amio drip to suppress PVCs. Got dose of IV lasix x1. CVP low.  2/4 Ruptured PSA. Returned to OR for repair. VAC in place 2/5 Midodrine 5 mg three times daily added.  Back in A fib.  2/7 TEE with mod MR.  2/8 VT arrest--> shock x1 ROSC after 2 minutes. Given lidocaine and amio was increased to 60 mg.  2/9 Transvenous pacemaker placed for overdrive pacing with PMVT.  2/11 Milrinone restarted, Co-ox 49% 2/12 PICC removed. S/p [employer] ICD  2/14 Milrinone stopped . Amio drip stopped.  2/15 Returned to OR for washout and VAC change with sartorious muscle flap.    Went to OR 2/20 for washout and wound vac placement.  Plan for wound vac change in OR Monday.   She remains on milrinone 0.125, good diuresis yesterday with creatinine 1.49 (lower).  CVP is not set up.  Co-ox 72%.    Wound culture growing morganella and enterococcus. She is on Zosyn.    Objective:   Weight Range: 66.9 kg Body mass index is 26.98 kg/m.   Vital Signs:   Temp:  [97.4 F (36.3 C)-98.7 F (37.1 C)] 97.8 F (36.6 C) (02/25 0747) Pulse Rate:  [62-72] 70 (02/25 0747) Resp:  [18-19] 19 (02/25 0747) BP: (113-138)/(58-74) 121/70 (02/25 0747) SpO2:  [97 %-100 %] 100  % (02/25 0747) Weight:  [66.9 kg] 66.9 kg (02/25 0900) Last BM Date : 02/04/23  Weight change: Filed Weights   02/03/23 2351 02/04/23 0500 02/05/23 0900  Weight: 82.7 kg 82.7 kg 66.9 kg    Intake/Output:   Intake/Output Summary (Last 24 hours) at 02/05/2023 1008 Last data filed at 02/05/2023 0749 Gross per 24 hour  Intake 1088.15 ml  Output 3225 ml  Net -2136.85 ml     Physical Exam   General: NAD Neck: JVP 8 cm, no thyromegaly or thyroid nodule.  Lungs: Clear to auscultation bilaterally with normal respiratory effort. CV: Nondisplaced PMI.  Heart irregular S1/S2, no S3/S4, no murmur.  Trace ankle edema.   Abdomen: Soft, nontender, no hepatosplenomegaly, no distention.  Skin: Intact without lesions or rashes.  Neurologic: Alert and oriented x 3.  Psych: Normal affect. Extremities: No clubbing or cyanosis.  HEENT: Normal.    Telemetry    A paced 70s (Personally reviewed)    Labs    CBC Recent Labs    02/04/23 0051 02/05/23 0621  WBC 7.6 7.6  HGB 9.2* 9.5*  HCT 30.9* 30.6*  MCV 103.7* 101.0*  PLT 334 [id number]   Basic Metabolic Panel Recent Labs    02/04/23 0051 02/05/23 0621  NA 142 141  K 3.5 3.5  CL 102 95*  CO2 29 35*  GLUCOSE 109* 115*  BUN 38* 38*  CREATININE 1.69* 1.49*  CALCIUM 9.3 9.8  MG 1.8 2.0   BNP: BNP (last 3 results) Recent Labs    08/31/22 1613 01/10/23 1048 02/01/23 1239  BNP 1,178.5* >4,500.0* 914.7*   Imaging   No results found.  Medications:     Scheduled Medications:  acetaminophen  650 mg Oral Q6H   amiodarone  200 mg Oral BID   apixaban  2.5 mg Oral BID   Chlorhexidine Gluconate Cloth  6 each Topical Daily   clopidogrel  75 mg Oral Daily   feeding supplement  237 mL Oral TID BM   fentaNYL (SUBLIMAZE) injection  50 mcg Intravenous Once   furosemide  80 mg Intravenous BID   insulin aspart  0-15 Units Subcutaneous TID WC   lidocaine  1 patch Transdermal Q24H   lidocaine  1 patch Transdermal Q24H   nutrition  supplement (JUVEN)  1 packet Oral BID BM   rosuvastatin  10 mg Oral QHS   sodium chloride flush  10-40 mL Intracatheter Q12H   sodium chloride flush  10-40 mL Intracatheter Q12H   sodium chloride flush  3 mL Intravenous Q12H   sodium chloride flush  3 mL Intravenous Q12H   spironolactone  12.5 mg Oral Daily    Infusions:  sodium chloride 250 mL (01/23/23 1235)   milrinone 0.125 mcg/kg/min (02/04/23 1852)   piperacillin-tazobactam (ZOSYN)  IV 3.375 g (02/05/23 0611)   PRN Medications: sodium chloride, acetaminophen, alum & mag hydroxide-simeth, hydrocortisone cream, ipratropium-albuterol, nitroGLYCERIN, ondansetron (ZOFRAN) IV, mouth rinse, oxyCODONE, simethicone, sodium chloride flush, sodium chloride flush, traMADol  Assessment/Plan   1. Cardiogenic shock/acute on chronic systolic CHF: Echo this admission with EF 20-25%, mild LV dilation, mild RV dysfunction, severe MR with restricted posterior leaflet.  Echo in 2/23 with EF 50-55%.  Patient has history of nonischemic cardiomyopathy (?PVC-mediated) that improved.  She reports 2 months of dyspnea then had lateral STEMI 1/30 with chest pain.  PLOM occlusion not thought to explain the extent of her cardiomyopathy, so suspect EF may have been falling prior to this event (symptomatic at least 2 months).  Suspect mixed ischemic/nonischemic cardiomyopathy. She initially had IABP for cardiogenic shock, now removed due to right leg ischemia. Was weaned off pressors/inotropes but Milrinone restarted 2/23 with volume overload and poor diuresis.  Good diuresis yesterday, I/Os net negative 1922.  Weight not done yet.  CVP not set up.  Co-ox 72%.  She looks mildly volume overloaded (better than yesterday).  - Continue Lasix 80 mg IV bid today, hold off on metolazone.  Will set up CVP.  - Continue milrinone 0.125 for now, try again to stop early in the week.   - Continue spironolactone 12.5 daily.   - Hold off on SGLT2i given groin infection.  - additional  GDMT initially limited by hypotension and AKI, now off midodrine. .  - S/p  DDD ICD.  - Strict I&O, daily weights 2. Mitral regurgitation: severe MR with restricted posterior leaflet on initial echo, suspect infarct-related MR with PLOM occlusion. TEE was later done showing moderate MR.  3. CAD: Lateral STEMI with occluded pLOM, treated with DES.   - Off ASA given apixaban use.  - Switched from Brilinta to plavix.  - Continue statin.  4. 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  - Continue amio 200 mg twice a day.   5. Right femoral PSA: s/p thrombin injection by Dr. Carlis Abbott. 2/4 stat u/s with recurrent PSA with active bleeding into PSA. S/p  PSA repair with VAC placement. 2/15 VAC change with sartorious muscle flap.  Vascular following. Went to OR 2/20 for washout and wound vac placement.   - Plan for wound vac change in OR Monday with Dr. Virl Cagey.  - She is on Zosyn for wound infection.  6. AKI on CKD stage 3b: Baseline 1.3-1.5.  Creatinine trended up to 2.06. Milrinone restarted for low output. SCr back to 1.69 today.  7.  Acute blood loss anemia:  Hgb stable.  8. Atrial fibrillation: Paroxysmal. She is in NSR.  - Amiodarone 200 mg bid  - Continue apixaban 2.5 bid.  9. VT Arrest: 2/7, DCCV x 1 with CPR, ROSC after 2 minutes.  Polymorphic VT, appeared bradycardia-mediated with intermittent junctional rhythm. - s/p DDD ICD on 2/12 - Continue amiodarone.  Loralie Champagne 02/05/2023

## 2023-02-05 NOTE — Progress Notes (Signed)
PROGRESS NOTE  Natasha Woodard  DOB: 03-19-1941  PCP: Tonia Ghent, MD [id number]  DOA: 01/10/2023  LOS: 25 days  Hospital Day: 27  Brief narrative: Natasha Woodard is a 82 y.o. female with PMH significant for DM2, HTN, HLD, nonischemic cardiomyopathy secondary to PVCs suppressed on low-dose amiodarone with ultimate recovery of EF. 1/30, patient presented to the ED with progressively worsening chest pain, worsening shortness of breath.  Troponin went up to over 2000.  She was given IV Lasix, stat echo showed EF of 20 to 25% with wall motion abnormalities.  She underwent urgent cardiac catheterization.  She was found to have acute lateral wall MI secondary to distal LCx occlusion complicated by severe ischemic MR and acute systolic heart failure with low output (EF 20 to 25%).  She required placement of IABP which was later removed given concern of developing limb ischemia.  1/31, she was found to have right femoral pseudoaneurysm was ultimately ruptured for which she required emergent repair on 2/4. Postcardiac cath, she was started on amiodarone drip to suppress PVCs. 2/8, she had VT arrest, ROSC achieved after a shock.  She was started on milrinone drip. 2/12, underwent ICD placement. 2/14, milrinone and amiodarone stopped. 2/15, returned to OR for washout and VAC change with sartorious muscle flap.  2/17: Transferred from heart failure team to [facility] service 2/20: Back to the OR for right groin washout, debridement, wound VAC replacement 2/22: Milrinone drip started again.  Subjective: Patient was seen and examined this morning.   Sitting up in recliner for the past 1 hour at the time of my evaluation.  Patient states she was hurting bad and wanted to get back to bed right away.  Complains of pain on the bottom all the time especially worse while sitting on the recliner. Ongoing diuresis with milrinone drip and IV Lasix.  Creatinine improving. Husband at bedside. Noted a plan  from vascular surgery for OR tomorrow.  Assessment and plan: Acute lateral wall MI  Presented with progressively worsening shortness of breath noted distal occlusion of the circumflex marginal vessel complicated by severe ischemic MR and acute systolic CHF treated with DES.   Continue Eliquis, plavix and statin.  Cardiogenic shock Severe ischemic MR and acute systolic CHF Echo [id number] w/ EF 20-25%, mildly reduced RV function, severe MR During cardiac cath, patient developed cardiogenic shock, initially required IABP which was later removed given concern of developing limb ischemia. TEE 2/7 confirmed severe MR ICD placement 2/12 Currently on IV Lasix and milrinone drip. Heart failure team following. Creatinine better today.  Paroxysmal atrial fibrillation VT arrest 2/7 S/p DCCV x 1 with CPR, ROSC after 2 minutes.  Polymorphic VT, appeared bradycardia-mediated with intermittent junctional rhythm. s/p DDD ICD on 2/12 Currently on amiodarone 200 mg twice daily. 2/21, cardiovascular surgery okay with reinitiation of Eliquis.   Right femoral pseudoaneurysm Right groin wound, secondary infection Patient developed pseudoaneurysm following balloon pump, s/p thrombin injection by Dr. Carlis Abbott 1/31.  2/4 stat u/s showed recurrent PSA with active bleeding into PSA. S/p PSA repair with VAC placement.  2/15 VAC change with sartorious muscle flap.  2/20, went to the OR for washout and wound VAC placement. Noted a plan from vascular surgery to change VAC in OR on Monday.  N.p.o. after midnight ordered. Wound culture showed polymicrobial growth showing moderate Morganella and Enterococcus with few  pseudomonas.   Continue IV Zosyn.     AKI on CKD stage 3b Baseline creatinine 1.3-1.5.  Creatinine peaked  at 2.06 on 2/13.  Improving in the last 24 hours. Recent Labs    01/27/23 0244 01/28/23 0042 01/29/23 0759 01/30/23 0712 01/31/23 0730 02/01/23 0805 02/02/23 0715 02/03/23 0614 02/04/23 0051  02/05/23 0621  BUN 44* 45* 41* 37* 32* 37* 37* 36* 38* 38*  CREATININE 1.89* 1.66* 1.52* 1.47* 1.34* 1.62* 1.74* 1.68* 1.69* 1.49*    Chronic anemia Severe iron deficiency Secondary to blood loss and iron deficiency given IV iron 2/18 Hemoglobin trend as below.  Stable above 8 for last 1 week. Recent Labs    01/13/23 0857 01/13/23 0858 01/13/23 1959 01/31/23 0730 02/02/23 0715 02/03/23 0614 02/04/23 0051 02/05/23 0621  HGB  --   --    < > 8.7* 8.9* 8.7* 9.2* 9.5*  MCV  --   --    < > 100.3* 101.0* 102.8* 103.7* 101.0*  VITAMINB12 298  --   --   --   --   --   --   --   FOLATE >40.0  --   --   --   --   --   --   --   FERRITIN  --  19  --   --   --   --   --   --   TIBC  --  276  --   --   --   --   --   --   IRON  --  14*  --   --   --   --   --   --   RETICCTPCT  --  4.6*  --   --   --   --   --   --    < > = values in this interval not displayed.    Mobility: PT eval obtained.  CIR recommended.  Goals of care   Code Status: Full Code     DVT prophylaxis:  apixaban (ELIQUIS) tablet 2.5 mg Start: 02/02/23 1400 Place TED hose Start: 02/02/23 1353 SCD's Start: 01/11/23 0410 apixaban (ELIQUIS) tablet 2.5 mg   Antimicrobials: IV Zosyn Fluid: None Consultants: Vascular surgery, cardiology Family Communication: Husband at bedside  Status is: Inpatient Level of care: Progressive   Dispo: Patient is from: Home              Anticipated d/c is to: Pending clinical course, probably CIR Continue in-hospital care because: Plan for wound VAC change in OR tomorrow   Scheduled Meds:  acetaminophen  650 mg Oral Q6H   amiodarone  200 mg Oral BID   apixaban  2.5 mg Oral BID   Chlorhexidine Gluconate Cloth  6 each Topical Daily   clopidogrel  75 mg Oral Daily   feeding supplement  237 mL Oral TID BM   fentaNYL (SUBLIMAZE) injection  50 mcg Intravenous Once   furosemide  80 mg Intravenous BID   insulin aspart  0-15 Units Subcutaneous TID WC   lidocaine  1 patch Transdermal  Q24H   lidocaine  1 patch Transdermal Q24H   nutrition supplement (JUVEN)  1 packet Oral BID BM   rosuvastatin  10 mg Oral QHS   sodium chloride flush  10-40 mL Intracatheter Q12H   sodium chloride flush  10-40 mL Intracatheter Q12H   sodium chloride flush  3 mL Intravenous Q12H   sodium chloride flush  3 mL Intravenous Q12H   spironolactone  12.5 mg Oral Daily    PRN meds: sodium chloride, acetaminophen, alum & mag hydroxide-simeth, hydrocortisone cream, ipratropium-albuterol, nitroGLYCERIN,  ondansetron (ZOFRAN) IV, mouth rinse, oxyCODONE, simethicone, sodium chloride flush, sodium chloride flush, traMADol   Infusions:   sodium chloride 250 mL (01/23/23 1235)   milrinone 0.125 mcg/kg/min (02/04/23 1852)   piperacillin-tazobactam (ZOSYN)  IV 3.375 g (02/05/23 [id number])    Diet:  Diet Order             Diet NPO time specified  Diet effective midnight           Diet regular Room service appropriate? Yes; Fluid consistency: Thin; Fluid restriction: 2000 mL Fluid  Diet effective now                   Antimicrobials: Anti-infectives (From admission, onward)    Start     Dose/Rate Route Frequency Ordered Stop   02/01/23 1700  vancomycin (VANCOCIN) IVPB 1000 mg/200 mL premix  Status:  Discontinued        1,000 mg 200 mL/hr over 60 Minutes Intravenous Every 48 hours 01/30/23 1548 01/31/23 1405   01/31/23 1400  cefTAZidime (FORTAZ) 2 g in sodium chloride 0.9 % 100 mL IVPB  Status:  Discontinued        2 g 200 mL/hr over 30 Minutes Intravenous Every 24 hours 01/30/23 1539 01/30/23 1548   01/31/23 1032  ceFAZolin 1 g / gentamicin 80 mg in NS 500 mL surgical irrigation  Status:  Discontinued          As needed 01/31/23 1033 01/31/23 1046   01/31/23 0600  piperacillin-tazobactam (ZOSYN) IVPB 3.375 g        3.375 g 12.5 mL/hr over 240 Minutes Intravenous Every 8 hours 01/30/23 1606     01/30/23 1800  piperacillin-tazobactam (ZOSYN) IVPB 3.375 g  Status:  Discontinued        3.375  g 12.5 mL/hr over 240 Minutes Intravenous Every 8 hours 01/30/23 1556 01/30/23 1606   01/30/23 1700  piperacillin-tazobactam (ZOSYN) IVPB 3.375 g  Status:  Discontinued        3.375 g 12.5 mL/hr over 240 Minutes Intravenous STAT 01/30/23 1606 01/30/23 1609   01/30/23 1700  cefTAZidime (FORTAZ) 2 g in sodium chloride 0.9 % 100 mL IVPB        2 g 200 mL/hr over 30 Minutes Intravenous  Once 01/30/23 1610 01/30/23 2131   01/30/23 1600  cefTAZidime (FORTAZ) 2 g in sodium chloride 0.9 % 100 mL IVPB  Status:  Discontinued        2 g 200 mL/hr over 30 Minutes Intravenous Every 24 hours 01/30/23 1548 01/30/23 1556   01/30/23 1400  cefTAZidime (FORTAZ) 2 g in sodium chloride 0.9 % 100 mL IVPB  Status:  Discontinued        2 g 200 mL/hr over 30 Minutes Intravenous Every 8 hours 01/30/23 1028 01/30/23 1539   01/30/23 1115  vancomycin (VANCOREADY) IVPB 1500 mg/300 mL  Status:  Discontinued        1,500 mg 150 mL/hr over 120 Minutes Intravenous  Once 01/30/23 1028 01/30/23 1556   01/29/23 2200  ceFAZolin (ANCEF) IVPB 2g/100 mL premix        2 g 200 mL/hr over 30 Minutes Intravenous 2 times daily 01/29/23 1335 01/29/23 2128   01/26/23 0000  vancomycin (VANCOCIN) IVPB 1000 mg/200 mL premix        1,000 mg 200 mL/hr over 60 Minutes Intravenous On call 01/25/23 0844 01/26/23 0944   01/24/23 2200  ceFAZolin (ANCEF) IVPB 2g/100 mL premix  Status:  Discontinued  2 g 200 mL/hr over 30 Minutes Intravenous 2 times daily 01/24/23 1023 01/29/23 1335   01/24/23 0900  ceFAZolin (ANCEF) IVPB 1 g/50 mL premix  Status:  Discontinued        1 g 100 mL/hr over 30 Minutes Intravenous 2 times daily 01/24/23 0823 01/24/23 1023   01/24/23 0430  ceFAZolin (ANCEF) IVPB 1 g/50 mL premix        1 g 100 mL/hr over 30 Minutes Intravenous Every 12 hours 01/23/23 1942 01/24/23 0524   01/23/23 0800  ceFAZolin (ANCEF) IVPB 2g/100 mL premix        2 g 200 mL/hr over 30 Minutes Intravenous On call 01/23/23 0011 01/23/23 1710    01/23/23 0100  gentamicin (GARAMYCIN) 80 mg in sodium chloride 0.9 % 500 mL irrigation        80 mg Irrigation On call 01/23/23 0011 01/23/23 1749   01/10/23 1958  ceFAZolin (ANCEF) IVPB 2 g/50 mL premix        over 30 Minutes  Continuous PRN 01/10/23 1958 01/10/23 2036       Skin assessment:       Nutritional status:  Body mass index is 26.98 kg/m.  Nutrition Problem: Moderate Malnutrition Etiology: acute illness Signs/Symptoms: mild fat depletion, mild muscle depletion, moderate muscle depletion     Objective: Vitals:   02/05/23 0408 02/05/23 0747  BP: (!) 113/58 121/70  Pulse: 72 70  Resp: 18 19  Temp: (!) 97.4 F (36.3 C) 97.8 F (36.6 C)  SpO2: 100% 100%    Intake/Output Summary (Last 24 hours) at 02/05/2023 1103 Last data filed at 02/05/2023 0749 Gross per 24 hour  Intake 1028.15 ml  Output 3225 ml  Net -2196.85 ml   Filed Weights   02/03/23 2351 02/04/23 0500 02/05/23 0900  Weight: 82.7 kg 82.7 kg 66.9 kg   Weight change:  Body mass index is 26.98 kg/m.   Physical Exam: General exam: Pleasant elderly African-American female.  Hurting at the bottom Skin: No rashes, lesions or ulcers.  Chronic ichthyosis HEENT: Atraumatic, normocephalic, no obvious bleeding Lungs: Clear to auscultation bilaterally CVS: Regular rate and rhythm, no murmur GI/Abd soft, nontender, nondistended, bowel sound present CNS: Alert, awake, oriented x 3.  Hard of hearing Psychiatry: Mood appropriate Extremities: Bilateral pedal edema seem slightly improved..  No calf tenderness.  Data Review: I have personally reviewed the laboratory data and studies available.  F/u labs ordered Unresulted Labs (From admission, onward)     Start     Ordered   02/03/23 [id number]  Basic metabolic panel  Daily,   R     Comments: While on Milrinone   Question:  Specimen collection method  Answer:  Lab=Lab collect   02/02/23 1352   02/03/23 0500  Magnesium  Daily,   R     Comments: While on  Milrinone   Question:  Specimen collection method  Answer:  Lab=Lab collect   02/02/23 1352   02/03/23 0500  Cooxemetry Panel (carboxy, met, total hgb, O2 sat)  Daily,   R     Question:  Specimen collection method  Answer:  Lab=Lab collect   02/02/23 1352   02/02/23 [id number]  Basic metabolic panel  Daily,   R     Question:  Specimen collection method  Answer:  Lab=Lab collect   02/01/23 1030   02/02/23 0500  CBC  Daily,   R     Question:  Specimen collection method  Answer:  Lab=Lab collect   02/01/23  1030   01/26/23 1112  Acid Fast Culture with reflexed sensitivities  RELEASE UPON ORDERING,   TIMED       Comments: Specimen A: Pre-op diagnosis: RIGHT GROIN WOUND    01/26/23 1112   01/26/23 0500  Magnesium  Daily,   R     Question:  Specimen collection method  Answer:  Lab=Lab collect   01/25/23 0754            Total time spent in review of labs and imaging, patient evaluation, formulation of plan, documentation and communication with family: 66 minutes  Signed, Terrilee Croak, MD [facility] 02/05/2023

## 2023-02-06 ENCOUNTER — Inpatient Hospital Stay (HOSPITAL_COMMUNITY): Payer: Medicare Other | Admitting: Anesthesiology

## 2023-02-06 ENCOUNTER — Other Ambulatory Visit: Payer: Self-pay

## 2023-02-06 ENCOUNTER — Encounter (HOSPITAL_COMMUNITY)
Admission: EM | Disposition: A | Discharge status: Rehabilitation Facility | Payer: Self-pay | Source: Home / Self Care | Attending: Cardiology

## 2023-02-06 ENCOUNTER — Encounter (HOSPITAL_COMMUNITY): Payer: Self-pay | Admitting: Internal Medicine

## 2023-02-06 DIAGNOSIS — I509 Heart failure, unspecified: Secondary | ICD-10-CM

## 2023-02-06 DIAGNOSIS — I214 Non-ST elevation (NSTEMI) myocardial infarction: Secondary | ICD-10-CM | POA: Diagnosis not present

## 2023-02-06 DIAGNOSIS — S31109D Unspecified open wound of abdominal wall, unspecified quadrant without penetration into peritoneal cavity, subsequent encounter: Secondary | ICD-10-CM

## 2023-02-06 DIAGNOSIS — I252 Old myocardial infarction: Secondary | ICD-10-CM | POA: Diagnosis not present

## 2023-02-06 DIAGNOSIS — I2510 Atherosclerotic heart disease of native coronary artery without angina pectoris: Secondary | ICD-10-CM

## 2023-02-06 DIAGNOSIS — I110 Hypertensive heart disease with heart failure: Secondary | ICD-10-CM | POA: Diagnosis not present

## 2023-02-06 DIAGNOSIS — J45909 Unspecified asthma, uncomplicated: Secondary | ICD-10-CM

## 2023-02-06 DIAGNOSIS — I9789 Other postprocedural complications and disorders of the circulatory system, not elsewhere classified: Secondary | ICD-10-CM | POA: Diagnosis not present

## 2023-02-06 DIAGNOSIS — Z87891 Personal history of nicotine dependence: Secondary | ICD-10-CM

## 2023-02-06 HISTORY — PX: GROIN DEBRIDEMENT: SHX5159

## 2023-02-06 LAB — GLUCOSE, CAPILLARY
Glucose-Capillary: 106 mg/dL — ABNORMAL HIGH (ref 70–99)
Glucose-Capillary: 114 mg/dL — ABNORMAL HIGH (ref 70–99)
Glucose-Capillary: 107 mg/dL — ABNORMAL HIGH (ref 70–99)
Glucose-Capillary: 102 mg/dL — ABNORMAL HIGH (ref 70–99)
Glucose-Capillary: 172 mg/dL — ABNORMAL HIGH (ref 70–99)

## 2023-02-06 LAB — CBC
HCT: 34.2 % — ABNORMAL LOW (ref 36.0–46.0)
Hemoglobin: 10.4 g/dL — ABNORMAL LOW (ref 12.0–15.0)
MCH: 30.9 pg (ref 26.0–34.0)
MCHC: 30.4 g/dL (ref 30.0–36.0)
MCV: 101.5 fL — ABNORMAL HIGH (ref 80.0–100.0)
Platelets: 327 10*3/uL (ref 150–400)
RBC: 3.37 MIL/uL — ABNORMAL LOW (ref 3.87–5.11)
RDW: 21.2 % — ABNORMAL HIGH (ref 11.5–15.5)
WBC: 6.8 10*3/uL (ref 4.0–10.5)
nRBC: 0 % (ref 0.0–0.2)

## 2023-02-06 LAB — COOXEMETRY PANEL
Carboxyhemoglobin: 2.9 % — ABNORMAL HIGH (ref 0.5–1.5)
Methemoglobin: 0.8 % (ref 0.0–1.5)
O2 Saturation: 61 %
Total hemoglobin: 10.7 g/dL — ABNORMAL LOW (ref 12.0–16.0)

## 2023-02-06 LAB — BASIC METABOLIC PANEL
Anion gap: 10 (ref 5–15)
BUN: 37 mg/dL — ABNORMAL HIGH (ref 8–23)
CO2: 39 mmol/L — ABNORMAL HIGH (ref 22–32)
Calcium: 10 mg/dL (ref 8.9–10.3)
Chloride: 90 mmol/L — ABNORMAL LOW (ref 98–111)
Creatinine, Ser: 1.5 mg/dL — ABNORMAL HIGH (ref 0.44–1.00)
GFR, Estimated: 35 mL/min — ABNORMAL LOW (ref >60)
Glucose, Bld: 128 mg/dL — ABNORMAL HIGH (ref 70–99)
Potassium: 3 mmol/L — ABNORMAL LOW (ref 3.5–5.1)
Sodium: 139 mmol/L (ref 135–145)

## 2023-02-06 LAB — MAGNESIUM: Magnesium: 1.9 mg/dL (ref 1.7–2.4)

## 2023-02-06 SURGERY — DEBRIDEMENT, INGUINAL REGION
Anesthesia: General | Laterality: Right

## 2023-02-06 MED ORDER — LIDOCAINE 2% (20 MG/ML) 5 ML SYRINGE
INTRAMUSCULAR | Status: AC
  Filled 2023-02-06: qty 5

## 2023-02-06 MED ORDER — PHENYLEPHRINE 80 MCG/ML (10ML) SYRINGE FOR IV PUSH (FOR BLOOD PRESSURE SUPPORT)
PREFILLED_SYRINGE | INTRAVENOUS | Status: DC | PRN
  Administered 2023-02-06 (×2): 80 ug via INTRAVENOUS
  Administered 2023-02-06: 160 ug via INTRAVENOUS

## 2023-02-06 MED ORDER — CHLORHEXIDINE GLUCONATE 0.12 % MT SOLN
OROMUCOSAL | Status: AC
  Administered 2023-02-06: 15 mL via OROMUCOSAL
  Filled 2023-02-06: qty 15

## 2023-02-06 MED ORDER — PROPOFOL 10 MG/ML IV BOLUS
INTRAVENOUS | Status: DC | PRN
  Administered 2023-02-06: 50 mg via INTRAVENOUS
  Administered 2023-02-06: 20 mg via INTRAVENOUS

## 2023-02-06 MED ORDER — EPHEDRINE SULFATE-NACL 50-0.9 MG/10ML-% IV SOSY
PREFILLED_SYRINGE | INTRAVENOUS | Status: DC | PRN
  Administered 2023-02-06: 10 mg via INTRAVENOUS
  Administered 2023-02-06: 5 mg via INTRAVENOUS

## 2023-02-06 MED ORDER — PROPOFOL 10 MG/ML IV BOLUS
INTRAVENOUS | Status: AC
  Filled 2023-02-06: qty 20

## 2023-02-06 MED ORDER — LIDOCAINE 2% (20 MG/ML) 5 ML SYRINGE
INTRAMUSCULAR | Status: DC | PRN
  Administered 2023-02-06: 60 mg via INTRAVENOUS

## 2023-02-06 MED ORDER — INSULIN ASPART 100 UNIT/ML IJ SOLN: 0.0000 [IU] | INTRAMUSCULAR | Status: DC | PRN

## 2023-02-06 MED ORDER — 0.9 % SODIUM CHLORIDE (POUR BTL) OPTIME
TOPICAL | Status: DC | PRN
  Administered 2023-02-06: 1000 mL

## 2023-02-06 MED ORDER — FENTANYL CITRATE (PF) 250 MCG/5ML IJ SOLN
INTRAMUSCULAR | Status: DC | PRN
  Administered 2023-02-06: 50 ug via INTRAVENOUS

## 2023-02-06 MED ORDER — CHLORHEXIDINE GLUCONATE 0.12 % MT SOLN: 15.0000 mL | Freq: Once | OROMUCOSAL | Status: AC

## 2023-02-06 MED ORDER — POTASSIUM CHLORIDE 20 MEQ PO PACK
40.0000 meq | PACK | Freq: Two times a day (BID) | ORAL | Status: DC
  Administered 2023-02-06: 40 meq via ORAL
  Filled 2023-02-06: qty 2

## 2023-02-06 MED ORDER — POTASSIUM CHLORIDE CRYS ER 20 MEQ PO TBCR
40.0000 meq | EXTENDED_RELEASE_TABLET | Freq: Two times a day (BID) | ORAL | Status: DC
  Administered 2023-02-06 – 2023-02-07 (×3): 40 meq via ORAL
  Filled 2023-02-06 (×3): qty 2

## 2023-02-06 MED ORDER — PHENYLEPHRINE 80 MCG/ML (10ML) SYRINGE FOR IV PUSH (FOR BLOOD PRESSURE SUPPORT)
PREFILLED_SYRINGE | INTRAVENOUS | Status: AC
  Filled 2023-02-06: qty 10

## 2023-02-06 MED ORDER — FENTANYL CITRATE (PF) 250 MCG/5ML IJ SOLN
INTRAMUSCULAR | Status: AC
  Filled 2023-02-06: qty 5

## 2023-02-06 MED ORDER — ONDANSETRON HCL 4 MG/2ML IJ SOLN
INTRAMUSCULAR | Status: DC | PRN
  Administered 2023-02-06: 4 mg via INTRAVENOUS

## 2023-02-06 MED ORDER — LACTATED RINGERS IV SOLN: INTRAVENOUS | Status: DC

## 2023-02-06 MED ORDER — MELATONIN 3 MG PO TABS
3.0000 mg | ORAL_TABLET | Freq: Every evening | ORAL | Status: DC | PRN
  Administered 2023-02-06 – 2023-02-08 (×3): 3 mg via ORAL
  Filled 2023-02-06 (×5): qty 1

## 2023-02-06 MED ORDER — ONDANSETRON HCL 4 MG/2ML IJ SOLN
INTRAMUSCULAR | Status: AC
  Filled 2023-02-06: qty 2

## 2023-02-06 MED ORDER — ORAL CARE MOUTH RINSE: 15.0000 mL | Freq: Once | OROMUCOSAL | Status: AC

## 2023-02-06 SURGICAL SUPPLY — 34 items
BAG COUNTER SPONGE SURGICOUNT (BAG) ×1 IMPLANT
BAG SPNG CNTER NS LX DISP (BAG)
CANISTER SUCT 3000ML PPV (MISCELLANEOUS) ×1 IMPLANT
CANISTER WOUND CARE 500ML ATS (WOUND CARE) IMPLANT
DRESSING VERAFLO CLEANS CC MED (GAUZE/BANDAGES/DRESSINGS) IMPLANT
DRSG VAC ATS MED SENSATRAC (GAUZE/BANDAGES/DRESSINGS) IMPLANT
DRSG VAC ATS SM SENSATRAC (GAUZE/BANDAGES/DRESSINGS) IMPLANT
DRSG VERAFLO CLEANSE CC MED (GAUZE/BANDAGES/DRESSINGS) ×1
DRSG VERAFLO VAC MED (GAUZE/BANDAGES/DRESSINGS) IMPLANT
ELECT REM PT RETURN 9FT ADLT (ELECTROSURGICAL) ×1
ELECTRODE REM PT RTRN 9FT ADLT (ELECTROSURGICAL) ×1 IMPLANT
GAUZE SPONGE 4X4 12PLY STRL (GAUZE/BANDAGES/DRESSINGS) ×1 IMPLANT
GLOVE BIOGEL PI IND STRL 8 (GLOVE) ×1 IMPLANT
GOWN STRL REUS W/ TWL LRG LVL3 (GOWN DISPOSABLE) ×2 IMPLANT
GOWN STRL REUS W/TWL 2XL LVL3 (GOWN DISPOSABLE) ×2 IMPLANT
GOWN STRL REUS W/TWL LRG LVL3 (GOWN DISPOSABLE) ×3
KIT BASIN OR (CUSTOM PROCEDURE TRAY) ×1 IMPLANT
KIT TURNOVER KIT B (KITS) ×1 IMPLANT
NS IRRIG 1000ML POUR BTL (IV SOLUTION) ×1 IMPLANT
PACK GENERAL/GYN (CUSTOM PROCEDURE TRAY) ×1 IMPLANT
PACK UNIVERSAL I (CUSTOM PROCEDURE TRAY) ×1 IMPLANT
PAD ARMBOARD 7.5X6 YLW CONV (MISCELLANEOUS) ×2 IMPLANT
PAD NEG PRESSURE SENSATRAC (MISCELLANEOUS) IMPLANT
POWDER MYRIAD MORCELLS 1000MG (Miscellaneous) IMPLANT
STAPLER VISISTAT 35W (STAPLE) IMPLANT
SUT ETHILON 3 0 PS 1 (SUTURE) IMPLANT
SUT MNCRL AB 4-0 PS2 18 (SUTURE) IMPLANT
SUT VIC AB 2-0 CT1 27 (SUTURE) ×3
SUT VIC AB 2-0 CT1 TAPERPNT 27 (SUTURE) IMPLANT
SUT VIC AB 2-0 CTB1 (SUTURE) IMPLANT
SUT VIC AB 3-0 SH 27 (SUTURE)
SUT VIC AB 3-0 SH 27X BRD (SUTURE) IMPLANT
TOWEL GREEN STERILE (TOWEL DISPOSABLE) ×1 IMPLANT
WATER STERILE IRR 1000ML POUR (IV SOLUTION) ×1 IMPLANT

## 2023-02-06 NOTE — Anesthesia Postprocedure Evaluation (Signed)
Anesthesia Post Note  Patient: LAQUITA LECOUNT  Procedure(s) Performed: RIGHT GROIN DEBRIDEMENT WITH WOUND VAC CHANGE (Right)     Patient location during evaluation: PACU Anesthesia Type: General Level of consciousness: awake and alert Pain management: pain level controlled Vital Signs Assessment: post-procedure vital signs reviewed and stable Respiratory status: spontaneous breathing, nonlabored ventilation, respiratory function stable and patient connected to nasal cannula oxygen Cardiovascular status: blood pressure returned to baseline and stable Postop Assessment: no apparent nausea or vomiting Anesthetic complications: no   No notable events documented.  Last Vitals:  Vitals:   02/06/23 1530 02/06/23 1545  BP: (!) 100/55 (!) 100/55  Pulse: 70 70  Resp: 20 18  Temp:  36.9 C  SpO2: 92% 93%    Last Pain:  Vitals:   02/06/23 1545  TempSrc:   PainSc: 0-No pain                 Audry Pili

## 2023-02-06 NOTE — Progress Notes (Addendum)
Patient ID: Natasha Woodard, female   DOB: 01-03-1941, 82 y.o.   MRN: [id number]     Advanced Heart Failure Rounding Note  PCP-Cardiologist: Dr. Ellyn Hack AHF: Dr. 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 Now admitted for acute lateral wall MI 2/2 distal LCx occlusion c/b severe ischemic MR and acute systolic heart failure w/ low output, CI 2.1 on RHC. Required placement of IABP, but later removed given concerns for developing limb ischemia=>found to have Right femoral PSA then ultimate rupture s/p emergent repair. VT arrest 2/8. ICD placed 2/12.  Subjective:   1/31 Underwent thrombin injection to right femoral PSA . Hgb drifting down 10.3>8.3>6.8  2/1 Started on amio drip to suppress PVCs. Got dose of IV lasix x1. CVP low.  2/4 Ruptured PSA. Returned to OR for repair. VAC in place 2/5 Midodrine 5 mg three times daily added.  Back in A fib.  2/7 TEE with mod MR.  2/8 VT arrest--> shock x1 ROSC after 2 minutes. Given lidocaine and amio was increased to 60 mg.  2/9 Transvenous pacemaker placed for overdrive pacing with PMVT.  2/11 Milrinone restarted, Co-ox 49% 2/12 PICC removed. S/p [employer] ICD  2/14 Milrinone stopped . Amio drip stopped.  2/15 Returned to OR for washout and VAC change with sartorious muscle flap.    Went to OR 2/20 for washout and wound vac placement.  Plan for wound vac change in OR today.    She remains on milrinone 0.125, brisk diuresis noted.  Co-ox 61%.   Wound culture growing morganella and enterococcus. She is on Zosyn.    Feels ok. Denies SOB.   Objective:   Weight Range: 66.9 kg Body mass index is 26.98 kg/m.   Vital Signs:   Temp:  [98 F (36.7 C)-98.5 F (36.9 C)] 98.1 F (36.7 C) (02/26 0442) Pulse Rate:  [70-73] 71 (02/26 0442) Resp:  [16-20] 20 (02/25 1658) BP: (115-136)/(64-80) 125/70 (02/26 0442) SpO2:  [97 %-100 %] 100 % (02/26 0442) Weight:   [66.9 kg] 66.9 kg (02/26 0004) Last BM Date : 02/05/23  Weight change: Filed Weights   02/04/23 0500 02/05/23 0900 02/06/23 0004  Weight: 82.7 kg 66.9 kg 66.9 kg    Intake/Output:   Intake/Output Summary (Last 24 hours) at 02/06/2023 0855 Last data filed at 02/06/2023 0444 Gross per 24 hour  Intake 28.63 ml  Output 4075 ml  Net -4046.37 ml    CVP 4  Physical Exam  General: . No resp difficulty HEENT: normal Neck: supple. no JVD. Carotids 2+ bilat; no bruits. No lymphadenopathy or thryomegaly appreciated. Cor: PMI nondisplaced. Regular rate & rhythm. No rubs, gallops or murmurs. Lungs: clear Abdomen: soft, nontender, nondistended. No hepatosplenomegaly. No bruits or masses. Good bowel sounds. Extremities: no cyanosis, clubbing, rash, edema. + wound VAC. RUE PICC  Neuro: alert & orientedx3, cranial nerves grossly intact. moves all 4 extremities w/o difficulty. Affect pleasant  Telemetry   A Paced 70s with occasional PVCs.   Labs    CBC Recent Labs    02/05/23 0621 02/06/23 0449  WBC 7.6 6.8  HGB 9.5* 10.4*  HCT 30.6* 34.2*  MCV 101.0* 101.5*  PLT 337 [id number]   Basic Metabolic Panel Recent Labs    02/05/23 0621 02/06/23 0449  NA 141 139  K 3.5 3.0*  CL 95* 90*  CO2 35* 39*  GLUCOSE 115* 128*  BUN 38* 37*  CREATININE 1.49* 1.50*  CALCIUM 9.8 10.0  MG 2.0 1.9   BNP: BNP (last 3 results) Recent Labs    08/31/22 1613 01/10/23 1048 02/01/23 1239  BNP 1,178.5* >4,500.0* 914.7*   Imaging   No results found.  Medications:     Scheduled Medications:  acetaminophen  650 mg Oral Q6H   amiodarone  200 mg Oral BID   apixaban  2.5 mg Oral BID   Chlorhexidine Gluconate Cloth  6 each Topical Daily   clopidogrel  75 mg Oral Daily   feeding supplement  237 mL Oral TID BM   fentaNYL (SUBLIMAZE) injection  50 mcg Intravenous Once   furosemide  80 mg Intravenous BID   insulin aspart  0-15 Units Subcutaneous TID WC   lidocaine  1 patch Transdermal Q24H    lidocaine  1 patch Transdermal Q24H   nutrition supplement (JUVEN)  1 packet Oral BID BM   potassium chloride  40 mEq Oral BID   rosuvastatin  10 mg Oral QHS   sodium chloride flush  10-40 mL Intracatheter Q12H   sodium chloride flush  10-40 mL Intracatheter Q12H   sodium chloride flush  3 mL Intravenous Q12H   sodium chloride flush  3 mL Intravenous Q12H   spironolactone  12.5 mg Oral Daily    Infusions:  sodium chloride 250 mL (01/23/23 1235)   milrinone 0.125 mcg/kg/min (02/05/23 2347)   piperacillin-tazobactam (ZOSYN)  IV 3.375 g (02/06/23 0523)   PRN Medications: sodium chloride, acetaminophen, alum & mag hydroxide-simeth, hydrocortisone cream, ipratropium-albuterol, melatonin, nitroGLYCERIN, ondansetron (ZOFRAN) IV, mouth rinse, oxyCODONE, simethicone, sodium chloride flush, sodium chloride flush, traMADol  Assessment/Plan   1. Cardiogenic shock/acute on chronic systolic CHF: Echo this admission with EF 20-25%, mild LV dilation, mild RV dysfunction, severe MR with restricted posterior leaflet.  Echo in 2/23 with EF 50-55%.  Patient has history of nonischemic cardiomyopathy (?PVC-mediated) that improved.  She reports 2 months of dyspnea then had lateral STEMI 1/30 with chest pain.  PLOM occlusion not thought to explain the extent of her cardiomyopathy, so suspect EF may have been falling prior to this event (symptomatic at least 2 months).  Suspect mixed ischemic/nonischemic cardiomyopathy. She initially had IABP for cardiogenic shock, now removed due to right leg ischemia. Was weaned off pressors/inotropes but Milrinone restarted 2/23 with volume overload and poor diuresis.   - Volume status improved.  CVP 4. Hold lasix.    Co-ox 61%.   -  Continue milrinone 0.125 for now, try again to stop early in the week.  Anticipate stopped milrinone tomorrow if CVP ok.  - Continue spironolactone 12.5 daily.   - Hold off on SGLT2i given groin infection.  - additional GDMT initially limited by  hypotension and AKI, now off midodrine. .  - S/p  DDD ICD.  - Strict I&O, daily weights 2. Mitral regurgitation: severe MR with restricted posterior leaflet on initial echo, suspect infarct-related MR with PLOM occlusion. TEE was later done showing moderate MR.  3. CAD: Lateral STEMI with occluded pLOM, treated with DES.   - Off ASA given apixaban use.  - Switched from Brilinta to plavix.  - Continue statin.  4. 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  - Continue amio 200 mg twice a day.   5. Right femoral PSA: s/p thrombin injection by Dr. Carlis Abbott. 2/4 stat u/s with recurrent PSA with active bleeding into PSA. S/p PSA repair with VAC placement. 2/15 VAC change with sartorious muscle flap.  Vascular following. Went to OR 2/20 for washout and wound vac placement.   - Plan for wound vac change in OR today  with Dr. Virl Cagey.  - She is on Zosyn for wound infection.  6. AKI on CKD stage 3b: Baseline 1.3-1.5.  Creatinine trended up to 2.06. Milrinone restarted for low output. SCr stable 1.5  7.  Acute blood loss anemia:  Hgb stable.  8. Atrial fibrillation: Paroxysmal. Maintaining SR.  - Amiodarone 200 mg bid  - Continue apixaban 2.5 bid.  9. VT Arrest: 2/7, DCCV x 1 with CPR, ROSC after 2 minutes.  Polymorphic VT, appeared bradycardia-mediated with intermittent junctional rhythm. - s/p DDD ICD on 2/12 - Continue amiodarone.  Amy Clegg NP-C  02/06/2023  Patient seen and examined with the above-signed Advanced Practice Provider and/or Housestaff. I personally reviewed laboratory data, imaging studies and relevant notes. I independently examined the patient and formulated the important aspects of the plan. I have edited the note to reflect any of my changes or salient points. I have personally discussed the plan with the patient and/or family.  Remains on milrinone. CVP down to 4. SCr up.  Patient weighed several times today and weight down 40 pounds from most recent  weights. Weight now back to baseline.   Underwent wound vac change in OR today.   Denies CP or SOB. Feels weak.   General:  Weak appearing. No resp difficulty HEENT: normal Neck: supple. no JVD. Carotids 2+ bilat; no bruits. No lymphadenopathy or thryomegaly appreciated. Cor: PMI nondisplaced. Regular rate & rhythm. 2/6 MR Lungs: clear Abdomen: soft, nontender, nondistended. No hepatosplenomegaly. No bruits or masses. Good bowel sounds. Extremities: no cyanosis, clubbing, rash, 1+ edema R groin wound vac Neuro: alert & orientedx3, cranial nerves grossly intact. moves all 4 extremities w/o difficulty. Affect pleasant  Agree with stopping lasix. Continue milrinone. Continue compression hose. Appreciate VVS care for groin infection.  Glori Bickers, MD  7:37 PM

## 2023-02-06 NOTE — Transfer of Care (Signed)
Immediate Anesthesia Transfer of Care Note  Patient: Natasha Woodard  Procedure(s) Performed: RIGHT GROIN DEBRIDEMENT WITH WOUND VAC CHANGE (Right)  Patient Location: PACU  Anesthesia Type:General  Level of Consciousness: awake, alert , and oriented  Airway & Oxygen Therapy: Patient Spontanous Breathing and Patient connected to nasal cannula oxygen  Post-op Assessment: Report given to RN and Post -op Vital signs reviewed and stable  Post vital signs: Reviewed and stable  Last Vitals:  Vitals Value Taken Time  BP 112/53 02/06/23 1446  Temp    Pulse 65 02/06/23 1451  Resp 13 02/06/23 1451  SpO2 96 % 02/06/23 1451  Vitals shown include unvalidated device data.  Last Pain:  Vitals:   02/06/23 1111  TempSrc:   PainSc: 0-No pain      Patients Stated Pain Goal: 2 ([id number] [phone])  Complications: No notable events documented.

## 2023-02-06 NOTE — Progress Notes (Incomplete)
PROGRESS NOTE  Natasha Woodard  DOB: October 04, 1941  PCP: Tonia Ghent, MD [id number]  DOA: 01/10/2023  LOS: 50 days  Hospital Day: 28  Brief narrative: Natasha Woodard is a 82 y.o. female with PMH significant for DM2, HTN, HLD, nonischemic cardiomyopathy secondary to PVCs suppressed on low-dose amiodarone with ultimate recovery of EF. 1/30, patient presented to the ED with progressively worsening chest pain, worsening shortness of breath.  Troponin went up to over 2000.  She was given IV Lasix, stat echo showed EF of 20 to 25% with wall motion abnormalities.  She underwent urgent cardiac catheterization.  She was found to have acute lateral wall MI secondary to distal LCx occlusion complicated by severe ischemic MR and acute systolic heart failure with low output (EF 20 to 25%).  She required placement of IABP which was later removed given concern of developing limb ischemia.  1/31, she was found to have right femoral pseudoaneurysm was ultimately ruptured for which she required emergent repair on 2/4. Postcardiac cath, she was started on amiodarone drip to suppress PVCs. 2/8, she had VT arrest, ROSC achieved after a shock.  She was started on milrinone drip. 2/12, underwent ICD placement. 2/14, milrinone and amiodarone stopped. 2/15, returned to OR for washout and VAC change with sartorious muscle flap.  2/17: Transferred from heart failure team to [facility] service 2/20: Back to the OR for right groin washout, debridement, wound VAC replacement 2/22: Milrinone drip started again.  Subjective: Patient was seen and examined this morning before the surgery. Not in distress Significant diuresis with milrinone drip  Assessment and plan: Acute lateral wall MI  Presented with progressively worsening shortness of breath noted distal occlusion of the circumflex marginal vessel complicated by severe ischemic MR and acute systolic CHF treated with DES.   Continue Eliquis, plavix and  statin.  Cardiogenic shock Severe ischemic MR and acute systolic CHF Echo [id number] w/ EF 20-25%, mildly reduced RV function, severe MR During cardiac cath, patient developed cardiogenic shock, initially required IABP which was later removed given concern of developing limb ischemia. TEE 2/7 confirmed severe MR ICD placement 2/12 Diuresed with IV Lasix, milrinone drip. Heart failure team following.  Noted to plan to hold Lasix and continue milrinone drip today Creatinine better today.  Paroxysmal atrial fibrillation VT arrest 2/7 S/p DCCV x 1 with CPR, ROSC after 2 minutes.  Polymorphic VT, appeared bradycardia-mediated with intermittent junctional rhythm. s/p DDD ICD on 2/12 Currently on amiodarone 200 mg twice daily. 2/21, cardiovascular surgery okay with reinitiation of Eliquis.   Right femoral pseudoaneurysm Right groin wound, secondary infection Patient developed pseudoaneurysm following balloon pump, s/p thrombin injection by Dr. Carlis Abbott 1/31.  2/4 stat u/s showed recurrent PSA with active bleeding into PSA. S/p PSA repair with VAC placement.  2/15 VAC change with sartorious muscle flap.  2/20, went to the OR for washout and wound VAC placement. 2/23, vascular surgery plans to change VAC in OR on Monday.  Wound culture showed polymicrobial growth showing moderate Morganella and Enterococcus with few  pseudomonas.   Continue IV Zosyn.     AKI on CKD stage 3b Baseline creatinine 1.3-1.5.  Creatinine peaked at 2.06 on 2/13.  Stable in the last 24 hours Recent Labs    01/28/23 0042 01/29/23 0759 01/30/23 0712 01/31/23 0730 02/01/23 0805 02/02/23 0715 02/03/23 0614 02/04/23 0051 02/05/23 0621 02/06/23 0449  BUN 45* 41* 37* 32* 37* 37* 36* 38* 38* 37*  CREATININE 1.66* 1.52* 1.47* 1.34* 1.62* 1.74* 1.68*  1.69* 1.49* 1.50*     Chronic anemia Severe iron deficiency Secondary to blood loss and iron deficiency given IV iron 2/18 Hemoglobin trend as below.  Stable stable.   Improved number probably because of hemoconcentration after significant diuresis Recent Labs    01/13/23 0857 01/13/23 0858 01/13/23 1959 02/02/23 0715 02/03/23 0614 02/04/23 0051 02/05/23 0621 02/06/23 0449  HGB  --   --    < > 8.9* 8.7* 9.2* 9.5* 10.4*  MCV  --   --    < > 101.0* 102.8* 103.7* 101.0* 101.5*  VITAMINB12 298  --   --   --   --   --   --   --   FOLATE >40.0  --   --   --   --   --   --   --   FERRITIN  --  19  --   --   --   --   --   --   TIBC  --  276  --   --   --   --   --   --   IRON  --  14*  --   --   --   --   --   --   RETICCTPCT  --  4.6*  --   --   --   --   --   --    < > = values in this interval not displayed.     Mobility: PT eval obtained.  CIR recommended.  Goals of care   Code Status: Full Code     DVT prophylaxis:  apixaban (ELIQUIS) tablet 2.5 mg Start: 02/02/23 1400 Place TED hose Start: 02/02/23 1353 SCD's Start: 01/11/23 0410 apixaban (ELIQUIS) tablet 2.5 mg   Antimicrobials: IV Zosyn Fluid: None Consultants: Vascular surgery, cardiology Family Communication: Husband at bedside  Status is: Inpatient Level of care: Progressive   Dispo: Patient is from: Home              Anticipated d/c is to: Pending clinical course, probably CIR Continue in-hospital care because: Undergoing going wound VAC change in OR today   Scheduled Meds:  [MAR Hold] acetaminophen  650 mg Oral Q6H   [MAR Hold] amiodarone  200 mg Oral BID   [MAR Hold] apixaban  2.5 mg Oral BID   [MAR Hold] Chlorhexidine Gluconate Cloth  6 each Topical Daily   [MAR Hold] clopidogrel  75 mg Oral Daily   [MAR Hold] feeding supplement  237 mL Oral TID BM   [MAR Hold] fentaNYL (SUBLIMAZE) injection  50 mcg Intravenous Once   [MAR Hold] insulin aspart  0-15 Units Subcutaneous TID WC   [MAR Hold] lidocaine  1 patch Transdermal Q24H   [MAR Hold] lidocaine  1 patch Transdermal Q24H   [MAR Hold] nutrition supplement (JUVEN)  1 packet Oral BID BM   [MAR Hold] potassium chloride   40 mEq Oral BID   [MAR Hold] rosuvastatin  10 mg Oral QHS   [MAR Hold] sodium chloride flush  10-40 mL Intracatheter Q12H   [MAR Hold] sodium chloride flush  10-40 mL Intracatheter Q12H   [MAR Hold] sodium chloride flush  3 mL Intravenous Q12H   [MAR Hold] sodium chloride flush  3 mL Intravenous Q12H   [MAR Hold] spironolactone  12.5 mg Oral Daily    PRN meds: [MAR Hold] sodium chloride, 0.9 % irrigation (POUR BTL), [MAR Hold] acetaminophen, [MAR Hold] alum & mag hydroxide-simeth, [MAR Hold] hydrocortisone cream, insulin aspart, [  MAR Hold] ipratropium-albuterol, [MAR Hold] melatonin, [MAR Hold] nitroGLYCERIN, [MAR Hold] ondansetron (ZOFRAN) IV, [MAR Hold] mouth rinse, [MAR Hold] oxyCODONE, [MAR Hold] simethicone, [MAR Hold] sodium chloride flush, [MAR Hold] sodium chloride flush, [MAR Hold] traMADol   Infusions:   [MAR Hold] sodium chloride 250 mL (01/23/23 1235)   lactated ringers 10 mL/hr at 02/06/23 1323   milrinone 0.125 mcg/kg/min (02/06/23 1323)   [MAR Hold] piperacillin-tazobactam (ZOSYN)  IV 3.375 g (02/06/23 0523)    Diet:  Diet Order             Diet NPO time specified  Diet effective midnight                   Antimicrobials: Anti-infectives (From admission, onward)    Start     Dose/Rate Route Frequency Ordered Stop   02/01/23 1700  vancomycin (VANCOCIN) IVPB 1000 mg/200 mL premix  Status:  Discontinued        1,000 mg 200 mL/hr over 60 Minutes Intravenous Every 48 hours 01/30/23 1548 01/31/23 1405   01/31/23 1400  cefTAZidime (FORTAZ) 2 g in sodium chloride 0.9 % 100 mL IVPB  Status:  Discontinued        2 g 200 mL/hr over 30 Minutes Intravenous Every 24 hours 01/30/23 1539 01/30/23 1548   01/31/23 1032  ceFAZolin 1 g / gentamicin 80 mg in NS 500 mL surgical irrigation  Status:  Discontinued          As needed 01/31/23 1033 01/31/23 1046   01/31/23 0600  [MAR Hold]  piperacillin-tazobactam (ZOSYN) IVPB 3.375 g        (MAR Hold since Mon 02/06/2023 at 1050.Hold  Reason: Transfer to a Procedural area)   3.375 g 12.5 mL/hr over 240 Minutes Intravenous Every 8 hours 01/30/23 1606     01/30/23 1800  piperacillin-tazobactam (ZOSYN) IVPB 3.375 g  Status:  Discontinued        3.375 g 12.5 mL/hr over 240 Minutes Intravenous Every 8 hours 01/30/23 1556 01/30/23 1606   01/30/23 1700  piperacillin-tazobactam (ZOSYN) IVPB 3.375 g  Status:  Discontinued        3.375 g 12.5 mL/hr over 240 Minutes Intravenous STAT 01/30/23 1606 01/30/23 1609   01/30/23 1700  cefTAZidime (FORTAZ) 2 g in sodium chloride 0.9 % 100 mL IVPB        2 g 200 mL/hr over 30 Minutes Intravenous  Once 01/30/23 1610 01/30/23 2131   01/30/23 1600  cefTAZidime (FORTAZ) 2 g in sodium chloride 0.9 % 100 mL IVPB  Status:  Discontinued        2 g 200 mL/hr over 30 Minutes Intravenous Every 24 hours 01/30/23 1548 01/30/23 1556   01/30/23 1400  cefTAZidime (FORTAZ) 2 g in sodium chloride 0.9 % 100 mL IVPB  Status:  Discontinued        2 g 200 mL/hr over 30 Minutes Intravenous Every 8 hours 01/30/23 1028 01/30/23 1539   01/30/23 1115  vancomycin (VANCOREADY) IVPB 1500 mg/300 mL  Status:  Discontinued        1,500 mg 150 mL/hr over 120 Minutes Intravenous  Once 01/30/23 1028 01/30/23 1556   01/29/23 2200  ceFAZolin (ANCEF) IVPB 2g/100 mL premix        2 g 200 mL/hr over 30 Minutes Intravenous 2 times daily 01/29/23 1335 01/29/23 2128   01/26/23 0000  vancomycin (VANCOCIN) IVPB 1000 mg/200 mL premix        1,000 mg 200 mL/hr over 60 Minutes Intravenous  On call 01/25/23 0844 01/26/23 0944   01/24/23 2200  ceFAZolin (ANCEF) IVPB 2g/100 mL premix  Status:  Discontinued        2 g 200 mL/hr over 30 Minutes Intravenous 2 times daily 01/24/23 1023 01/29/23 1335   01/24/23 0900  ceFAZolin (ANCEF) IVPB 1 g/50 mL premix  Status:  Discontinued        1 g 100 mL/hr over 30 Minutes Intravenous 2 times daily 01/24/23 0823 01/24/23 1023   01/24/23 0430  ceFAZolin (ANCEF) IVPB 1 g/50 mL premix        1 g 100  mL/hr over 30 Minutes Intravenous Every 12 hours 01/23/23 1942 01/24/23 0524   01/23/23 0800  ceFAZolin (ANCEF) IVPB 2g/100 mL premix        2 g 200 mL/hr over 30 Minutes Intravenous On call 01/23/23 0011 01/23/23 1710   01/23/23 0100  gentamicin (GARAMYCIN) 80 mg in sodium chloride 0.9 % 500 mL irrigation        80 mg Irrigation On call 01/23/23 0011 01/23/23 1749   01/10/23 1958  ceFAZolin (ANCEF) IVPB 2 g/50 mL premix        over 30 Minutes  Continuous PRN 01/10/23 1958 01/10/23 2036       Skin assessment:       Nutritional status:  Body mass index is 26.98 kg/m.  Nutrition Problem: Moderate Malnutrition Etiology: acute illness Signs/Symptoms: mild fat depletion, mild muscle depletion, moderate muscle depletion  {Tip this will not be part of the note when signed Body mass index is 26.98 kg/m. ,  Nutrition Documentation    Flowsheet Row ED to Hosp-Admission (Current) from 01/10/2023 in [facility]  Nutrition Problem Moderate Malnutrition  Etiology acute illness  Nutrition Goal Patient will meet greater than or equal to 90% of their needs  Interventions Ensure Enlive (each supplement provides 350kcal and 20 grams of protein), Refer to RD note for recommendations, Liberalize Diet, MVI, Education     ,  (Optional):26781}   Objective: Vitals:   02/06/23 1147 02/06/23 1149  BP:    Pulse: 75 76  Resp: 18 17  Temp:    SpO2: 98% 99%    Intake/Output Summary (Last 24 hours) at 02/06/2023 1427 Last data filed at 02/06/2023 1022 Gross per 24 hour  Intake 72.56 ml  Output 4225 ml  Net -4152.44 ml    Filed Weights   02/06/23 0004 02/06/23 1037 02/06/23 1053  Weight: 66.9 kg 66.9 kg 66.9 kg   Weight change:  Body mass index is 26.98 kg/m.   Physical Exam: General exam: Pleasant elderly African-American female.  Hurting at the bottom Skin: No rashes, lesions or ulcers.  Chronic ichthyosis HEENT: Atraumatic, normocephalic, no obvious  bleeding Lungs: Clear to auscultation bilaterally CVS: Regular rate and rhythm, no murmur GI/Abd soft, nontender, nondistended, bowel sound present CNS: Alert, awake, oriented x 3.  Hard of hearing Psychiatry: Mood appropriate Extremities: Bilateral pedal edema seem slightly improved..  No calf tenderness.  Data Review: I have personally reviewed the laboratory data and studies available.  F/u labs ordered Unresulted Labs (From admission, onward)     Start     Ordered   02/03/23 [id number]  Basic metabolic panel  Daily,   R     Comments: While on Milrinone   Question:  Specimen collection method  Answer:  Lab=Lab collect   02/02/23 1352   02/03/23 0500  Magnesium  Daily,   R     Comments: While on Milrinone  Question:  Specimen collection method  Answer:  Lab=Lab collect   02/02/23 1352   02/03/23 0500  Cooxemetry Panel (carboxy, met, total hgb, O2 sat)  Daily,   R     Question:  Specimen collection method  Answer:  Lab=Lab collect   02/02/23 1352   02/02/23 [id number]  Basic metabolic panel  Daily,   R     Question:  Specimen collection method  Answer:  Lab=Lab collect   02/01/23 1030   02/02/23 0500  CBC  Daily,   R     Question:  Specimen collection method  Answer:  Lab=Lab collect   02/01/23 1030   01/26/23 1112  Acid Fast Culture with reflexed sensitivities  RELEASE UPON ORDERING,   TIMED       Comments: Specimen A: Pre-op diagnosis: RIGHT GROIN WOUND    01/26/23 1112   01/26/23 0500  Magnesium  Daily,   R     Question:  Specimen collection method  Answer:  Lab=Lab collect   01/25/23 0754            Total time spent in review of labs and imaging, patient evaluation, formulation of plan, documentation and communication with family: 53 minutes  Signed, Terrilee Croak, MD [facility] 02/06/2023

## 2023-02-06 NOTE — Anesthesia Procedure Notes (Signed)
Procedure Name: LMA Insertion Date/Time: 02/06/2023 1:57 PM  Performed by: Babs Bertin, CRNAPre-anesthesia Checklist: Patient identified, Emergency Drugs available, Suction available and Patient being monitored Patient Re-evaluated:Patient Re-evaluated prior to induction Oxygen Delivery Method: Circle System Utilized Preoxygenation: Pre-oxygenation with 100% oxygen Induction Type: IV induction Ventilation: Mask ventilation without difficulty LMA: LMA inserted LMA Size: 4.0 Number of attempts: 1 Airway Equipment and Method: Bite block Placement Confirmation: positive ETCO2 Tube secured with: Tape Dental Injury: Teeth and Oropharynx as per pre-operative assessment

## 2023-02-06 NOTE — Anesthesia Preprocedure Evaluation (Addendum)
Anesthesia Evaluation  Patient identified by MRN, date of birth, ID band Patient awake    Reviewed: Allergy & Precautions, NPO status , Patient's Chart, lab work & pertinent test results  Airway Mallampati: III  TM Distance: >3 FB Neck ROM: Full    Dental no notable dental hx.    Pulmonary sleep apnea and Continuous Positive Airway Pressure Ventilation , COPD,  COPD inhaler, former smoker   Pulmonary exam normal        Cardiovascular hypertension, Pt. on medications and Pt. on home beta blockers + CAD, + Past MI, + Peripheral Vascular Disease and +CHF (LVEF 20-25%)  Normal cardiovascular exam(-) dysrhythmias + pacemaker + Cardiac Defibrillator + Valvular Problems/Murmurs (severe MR) MR     prior h/o nonischemic CM that was PVC mediated, EF recovered w/ suppression of PVCs. Now admitted for acute lateral wall MI 2/2 distal LCx occlusion c/b severe ischemic MR and acute systolic heart failure w/ low output, CI 2.1 on RHC. Required placement of IABP, but later removed given concerns for developing limb ischemia=>found to have Right femoral PSA then ultimate rupture s/p emergent repair. VT arrest 2/8. ICD placed 2/12.   '24 TEE - EF 30 to 35%. Left atrial size was severely dilated. Moderate MR. Aortic valve regurgitation is trivial.     Neuro/Psych negative neurological ROS  negative psych ROS   GI/Hepatic hiatal hernia,GERD  Controlled,,  Endo/Other  diabetes, Well Controlled, Type 2, Oral Hypoglycemic Agents    Renal/GU Renal InsufficiencyRenal diseaseCr 1.3     Musculoskeletal negative musculoskeletal ROS (+)    Abdominal  (+) + obese  Peds  Hematology negative hematology ROS (+)   Anesthesia Other Findings   Reproductive/Obstetrics                             Anesthesia Physical Anesthesia Plan  ASA: 4  Anesthesia Plan: General   Post-op Pain Management: Tylenol PO (pre-op)*    Induction: Intravenous  PONV Risk Score and Plan: 3 and Ondansetron and Treatment may vary due to age or medical condition  Airway Management Planned: LMA  Additional Equipment: ClearSight  Intra-op Plan:   Post-operative Plan: Extubation in OR  Informed Consent: I have reviewed the patients History and Physical, chart, labs and discussed the procedure including the risks, benefits and alternatives for the proposed anesthesia with the patient or authorized representative who has indicated his/her understanding and acceptance.     Dental advisory given  Plan Discussed with: CRNA and Anesthesiologist  Anesthesia Plan Comments:         Anesthesia Quick Evaluation

## 2023-02-06 NOTE — Progress Notes (Addendum)
  Progress Note    02/06/2023 6:51 AM 6 Days Post-Op  Subjective:  wants to know if she can have her Tylenol and wants to be turned. Says she was peeing all night.   afebrile  Vitals:   02/06/23 0004 02/06/23 0442  BP: 136/71 125/70  Pulse: 70 71  Resp:    Temp: 98 F (36.7 C) 98.1 F (36.7 C)  SpO2: 100% 100%    Physical Exam: General:  no distress Lungs:  non labored Incisions:  wound vac with good seal Extremities:  right foot is warm and well perfused Abdomen:  soft  CBC    Component Value Date/Time   WBC 6.8 02/06/2023 0449   RBC 3.37 (L) 02/06/2023 0449   HGB 10.4 (L) 02/06/2023 0449   HGB 14.2 10/09/2020 0935   HCT 34.2 (L) 02/06/2023 0449   HCT 44.9 10/09/2020 0935   PLT 327 02/06/2023 0449   PLT 379 10/09/2020 0935   MCV 101.5 (H) 02/06/2023 0449   MCV 91 10/09/2020 0935   MCH 30.9 02/06/2023 0449   MCHC 30.4 02/06/2023 0449   RDW 21.2 (H) 02/06/2023 0449   RDW 13.4 10/09/2020 0935   LYMPHSABS 0.9 01/15/2023 0950   LYMPHSABS 1.1 10/09/2020 0935   MONOABS 1.4 (H) 01/15/2023 0950   EOSABS 0.6 (H) 01/15/2023 0950   EOSABS 0.2 10/09/2020 0935   BASOSABS 0.1 01/15/2023 0950   BASOSABS 0.1 10/09/2020 0935    BMET    Component Value Date/Time   NA 139 02/06/2023 0449   NA 145 (H) 07/07/2022 1007   K 3.0 (L) 02/06/2023 0449   CL 90 (L) 02/06/2023 0449   CO2 39 (H) 02/06/2023 0449   GLUCOSE 128 (H) 02/06/2023 0449   BUN 37 (H) 02/06/2023 0449   BUN 24 07/07/2022 1007   CREATININE 1.50 (H) 02/06/2023 0449   CREATININE 1.28 (H) 01/14/2022 1551   CALCIUM 10.0 02/06/2023 0449   GFRNONAA 35 (L) 02/06/2023 0449   GFRAA 49 (L) 10/09/2020 0933    INR    Component Value Date/Time   INR 1.4 (H) 01/10/2023 2145     Intake/Output Summary (Last 24 hours) at 02/06/2023 0651 Last data filed at 02/06/2023 0444 Gross per 24 hour  Intake 323.71 ml  Output 4450 ml  Net -4126.29 ml      Assessment/Plan:  82 y.o. female is s/p:  right groin washout  with placement of myriad morcell and wound vac   6 Days Post-Op   -for OR later today for groin washout and replacement of vac -Wound culture growing morganella and enterococcus. She is on Zosyn -continue npo today -DVT prophylaxis:  Eliquis   Natasha Locket, PA-C [facility] [phone] 02/06/2023 6:51 AM  VASCULAR STAFF ADDENDUM: I have independently interviewed and examined the patient. I agree with the above.  After discussing the risks and benefits of right groin washout, possible debridement, VAC change, Natasha Woodard elected to proceed.   Cassandria Santee, MD [facility] Phone Number: [phone] 02/06/2023 12:49 PM

## 2023-02-06 NOTE — Progress Notes (Signed)
PT Cancellation Note  Patient Details Name: CHINA GAUT MRN: [id number] DOB: 08-02-1941   Cancelled Treatment:    Reason Eval/Treat Not Completed: Patient at procedure or test/unavailable   Viann Shove 02/06/2023, 1:24 PM

## 2023-02-06 NOTE — Op Note (Signed)
    NAME: Natasha Woodard    MRN: [id number] DOB: [birth year]    DATE OF OPERATION: 02/06/2023  PREOP DIAGNOSIS:    Right groin wound  POSTOP DIAGNOSIS:    Same  PROCEDURE:    Right groin washout, debridement, myriad morcell placement, vacuum-assisted closure  SURGEON: Broadus John  ASSIST: Luisa Dago, PA  ANESTHESIA: General   EBL: 73m  INDICATIONS:    CHYDIE SOUTHERLYis a 82y.o. female status post repair of ruptured pseudoaneurysm of the right profunda status post Impella placement for acute coronary syndrome. The patient has heart failure, poor nutrition, diabetes. She has been relatively immobile, but has improved, after arrest x 2 requiring defibrillation. [facility]. After discussing the risk and benefits, care elected to proceed.   FINDINGS:   Large right groin cavity measuring 12 x 4 x 2.5 cm-smaller than previous Healthier, granulating tissue in the wound bed, some devitalized muscle from previous hematoma that was present at the initial surgery from the pseudoaneurysm rupture. This was debrided.  TECHNIQUE:   Patient was brought to the OR laid in the supine position.  General anesthesia was induced and the patient was prepped and draped in standard fashion.    The case began with sharp debridement of devitalized tissues.  The wound bed continues to look healthier than previous with granulating tissue in the base.   Overall the flap appeared viable, however the distal aspect was necrotic. This was debrided. The wound bed was irrigated with copious amounts of saline.   Once dried, myriad morcell powder was brought onto the field and 1000 mg of powder was placed in the wound bed.  At this point I elected to place a piece of bacteriostatic sorbac and customize vacuum-assisted dressing.  This was placed in the wound bed in standard fashion and excellent seal confirmed.   The plan will be for the dressing remain in  place for 1 week with OR VAC change.  CAivreeand her family are aware the wound bed appears improved, however healing will be much slower than usual due to her comorbidities and poor nutrition.  Plan for VAC  changes on the floor from this point forward.   JMacie Burows MD [facility]:   02/06/2023

## 2023-02-06 NOTE — Telephone Encounter (Signed)
Noted. No change for now.

## 2023-02-06 NOTE — Progress Notes (Signed)
Pt seen in [place].  Removed outer bandage from pacemaker site.  Removed steristrips without issue.  No hematoma or drainage.  OK to gently cleanse the area with warm soap and water when bathing pt.  Re-reviewed arm restrictions. Please call with questions.   Legrand Como [address] [place], [place]

## 2023-02-07 ENCOUNTER — Encounter (HOSPITAL_COMMUNITY): Payer: Self-pay | Admitting: Vascular Surgery

## 2023-02-07 DIAGNOSIS — I214 Non-ST elevation (NSTEMI) myocardial infarction: Secondary | ICD-10-CM | POA: Diagnosis not present

## 2023-02-07 LAB — CBC
HCT: 33.3 % — ABNORMAL LOW (ref 36.0–46.0)
Hemoglobin: 10.1 g/dL — ABNORMAL LOW (ref 12.0–15.0)
MCH: 31.2 pg (ref 26.0–34.0)
MCHC: 30.3 g/dL (ref 30.0–36.0)
MCV: 102.8 fL — ABNORMAL HIGH (ref 80.0–100.0)
Platelets: 308 10*3/uL (ref 150–400)
RBC: 3.24 MIL/uL — ABNORMAL LOW (ref 3.87–5.11)
RDW: 21.1 % — ABNORMAL HIGH (ref 11.5–15.5)
WBC: 7.3 10*3/uL (ref 4.0–10.5)
nRBC: 0 % (ref 0.0–0.2)

## 2023-02-07 LAB — GLUCOSE, CAPILLARY
Glucose-Capillary: 110 mg/dL — ABNORMAL HIGH (ref 70–99)
Glucose-Capillary: 187 mg/dL — ABNORMAL HIGH (ref 70–99)
Glucose-Capillary: 138 mg/dL — ABNORMAL HIGH (ref 70–99)
Glucose-Capillary: 167 mg/dL — ABNORMAL HIGH (ref 70–99)

## 2023-02-07 LAB — COOXEMETRY PANEL
Carboxyhemoglobin: 3.4 % — ABNORMAL HIGH (ref 0.5–1.5)
Methemoglobin: 0.7 % (ref 0.0–1.5)
O2 Saturation: 81.2 %
Total hemoglobin: 10.6 g/dL — ABNORMAL LOW (ref 12.0–16.0)

## 2023-02-07 LAB — BASIC METABOLIC PANEL
Anion gap: 16 — ABNORMAL HIGH (ref 5–15)
BUN: 37 mg/dL — ABNORMAL HIGH (ref 8–23)
CO2: 33 mmol/L — ABNORMAL HIGH (ref 22–32)
Calcium: 9.5 mg/dL (ref 8.9–10.3)
Chloride: 92 mmol/L — ABNORMAL LOW (ref 98–111)
Creatinine, Ser: 1.62 mg/dL — ABNORMAL HIGH (ref 0.44–1.00)
GFR, Estimated: 32 mL/min — ABNORMAL LOW (ref >60)
Glucose, Bld: 141 mg/dL — ABNORMAL HIGH (ref 70–99)
Potassium: 4.2 mmol/L (ref 3.5–5.1)
Sodium: 141 mmol/L (ref 135–145)

## 2023-02-07 LAB — MAGNESIUM: Magnesium: 2 mg/dL (ref 1.7–2.4)

## 2023-02-07 MED ORDER — CARBAMIDE PEROXIDE 6.5 % OT SOLN
5.0000 [drp] | Freq: Two times a day (BID) | OTIC | Status: AC
  Administered 2023-02-07 – 2023-02-09 (×6): 5 [drp] via OTIC
  Filled 2023-02-07 (×2): qty 15

## 2023-02-07 NOTE — Progress Notes (Addendum)
  Progress Note    02/07/2023 7:40 AM 1 Day Post-Op  Subjective:  no complaints   Vitals:   02/07/23 0500 02/07/23 0714  BP:  (!) 120/52  Pulse: 70   Resp:  16  Temp:    SpO2: 97%    Physical Exam: Lungs:  non labored Incisions:  R groin with vac in place good seal Extremities:  feet warm and well perfused with motor and sensation intact Abdomen:  soft Neurologic: A&O  CBC    Component Value Date/Time   WBC 7.3 02/07/2023 0338   RBC 3.24 (L) 02/07/2023 0338   HGB 10.1 (L) 02/07/2023 0338   HGB 14.2 10/09/2020 0935   HCT 33.3 (L) 02/07/2023 0338   HCT 44.9 10/09/2020 0935   PLT 308 02/07/2023 0338   PLT 379 10/09/2020 0935   MCV 102.8 (H) 02/07/2023 0338   MCV 91 10/09/2020 0935   MCH 31.2 02/07/2023 0338   MCHC 30.3 02/07/2023 0338   RDW 21.1 (H) 02/07/2023 0338   RDW 13.4 10/09/2020 0935   LYMPHSABS 0.9 01/15/2023 0950   LYMPHSABS 1.1 10/09/2020 0935   MONOABS 1.4 (H) 01/15/2023 0950   EOSABS 0.6 (H) 01/15/2023 0950   EOSABS 0.2 10/09/2020 0935   BASOSABS 0.1 01/15/2023 0950   BASOSABS 0.1 10/09/2020 0935    BMET    Component Value Date/Time   NA 141 02/07/2023 0338   NA 145 (H) 07/07/2022 1007   K 4.2 02/07/2023 0338   CL 92 (L) 02/07/2023 0338   CO2 33 (H) 02/07/2023 0338   GLUCOSE 141 (H) 02/07/2023 0338   BUN 37 (H) 02/07/2023 0338   BUN 24 07/07/2022 1007   CREATININE 1.62 (H) 02/07/2023 0338   CREATININE 1.28 (H) 01/14/2022 1551   CALCIUM 9.5 02/07/2023 0338   GFRNONAA 32 (L) 02/07/2023 0338   GFRAA 49 (L) 10/09/2020 0933    INR    Component Value Date/Time   INR 1.4 (H) 01/10/2023 2145     Intake/Output Summary (Last 24 hours) at 02/07/2023 0740 Last data filed at 02/07/2023 0700 Gross per 24 hour  Intake 417.37 ml  Output 575 ml  Net -157.63 ml     Assessment/Plan:  82 y.o. female is s/p R groin debridement and vac change 1 Day Post-Op   R groin wound bed with some granulation tissue Continue wound vac Encouraged  nutrition Vac change at the bedside versus OR on Friday 3/1   Dagoberto Ligas, PA-C [facility] [phone] 02/07/2023 7:40 AM  VASCULAR STAFF ADDENDUM: I have independently interviewed and examined the patient. I agree with the above.  Vac to suction plan for bedside change Friday. Pt will need to be NPO in case unable to tolerate and needs OR trip. Possible CIR early next week pending continued improvement.   Pt aware she will not be an inpatient until the wound is completely healed 0- she will require outpt home nursing visits and VAC changes.  Pt with hearing difficulty - says she has cerumen in the ear and asked for evaluation.  Appreciate multidisciplinary care.   Cassandria Santee, MD [facility] Phone Number: [phone] 02/07/2023 9:33 AM

## 2023-02-07 NOTE — Progress Notes (Addendum)
Patient ID: Natasha Woodard, female   DOB: Dec 09, 1941, 82 y.o.   MRN: [id number]     Advanced Heart Failure Rounding Note  PCP-Cardiologist: Dr. Ellyn Hack AHF: Dr. 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 Now admitted for acute lateral wall MI 2/2 distal LCx occlusion c/b severe ischemic MR and acute systolic heart failure w/ low output, CI 2.1 on RHC. Required placement of IABP, but later removed given concerns for developing limb ischemia=>found to have Right femoral PSA then ultimate rupture s/p emergent repair. VT arrest 2/8. ICD placed 2/12.  Subjective:   1/31 Underwent thrombin injection to right femoral PSA . Hgb drifting down 10.3>8.3>6.8  2/1 Started on amio drip to suppress PVCs. Got dose of IV lasix x1. CVP low.  2/4 Ruptured PSA. Returned to OR for repair. VAC in place 2/5 Midodrine 5 mg three times daily added.  Back in A fib.  2/7 TEE with mod MR.  2/8 VT arrest--> shock x1 ROSC after 2 minutes. Given lidocaine and amio was increased to 60 mg.  2/9 Transvenous pacemaker placed for overdrive pacing with PMVT.  2/11 Milrinone restarted, Co-ox 49% 2/12 PICC removed. S/p [employer] Sci ICD  2/14 Milrinone stopped . Amio drip stopped.  2/15 Returned to OR for washout and VAC change with sartorious muscle flap.  2/20 OR for washout and wound vac placement.   2/26 OR groin washout, debridement and vac change   On milrinone 0.125. Co-ox 81%.    Diuretics held yesterday w/ low CVP and bump in SCr.  Wt up 4 lb. CVP 4-5 today SCr continues to trend up, 1.49>>1.50>>1.62   Resting comfortably w/o dyspnea. Reports mild chest wall soreness, relieved w/ lidocaine patch.     Objective:   Weight Range: 68.6 kg Body mass index is 27.66 kg/m.   Vital Signs:   Temp:  [97.8 F (36.6 C)-98.4 F (36.9 C)] 97.8 F (36.6 C) (02/27 0456) Pulse Rate:  [60-81] 70 (02/27 0500) Resp:  [13-24] 16 (02/27  0714) BP: (95-129)/(48-83) 120/52 (02/27 0714) SpO2:  [84 %-99 %] 97 % (02/27 0500) Weight:  [66.9 kg-68.6 kg] 68.6 kg (02/27 0500) Last BM Date : 02/06/23  Weight change: Filed Weights   02/06/23 1037 02/06/23 1053 02/07/23 0500  Weight: 66.9 kg 66.9 kg 68.6 kg    Intake/Output:   Intake/Output Summary (Last 24 hours) at 02/07/2023 0940 Last data filed at 02/07/2023 0700 Gross per 24 hour  Intake 417.37 ml  Output 575 ml  Net -157.63 ml     PHYSICAL EXAM: CVP 4-5  General:  Well appearing. No respiratory difficulty HEENT: normal Neck: supple. no JVD. Carotids 2+ bilat; no bruits. No lymphadenopathy or thyromegaly appreciated. Cor: PMI nondisplaced. Regular rate & rhythm. No rubs, gallops or murmurs. Lungs: clear Abdomen: soft, nontender, nondistended. No hepatosplenomegaly. No bruits or masses. Good bowel sounds. Extremities: no cyanosis, clubbing, rash, edema + left groin wound vac  Neuro: alert & oriented x 3, cranial nerves grossly intact. moves all 4 extremities w/o difficulty. Affect pleasant.   Telemetry   A Paced 70s with occasional PVCs.   Labs    CBC Recent Labs    02/06/23 0449 02/07/23 0338  WBC 6.8 7.3  HGB 10.4* 10.1*  HCT 34.2* 33.3*  MCV 101.5* 102.8*  PLT 327 [id number]   Basic Metabolic Panel Recent Labs    02/06/23 0449 02/07/23 0338  NA 139 141  K 3.0* 4.2  CL 90* 92*  CO2 39* 33*  GLUCOSE 128* 141*  BUN 37* 37*  CREATININE 1.50* 1.62*  CALCIUM 10.0 9.5  MG 1.9 2.0   BNP: BNP (last 3 results) Recent Labs    08/31/22 1613 01/10/23 1048 02/01/23 1239  BNP 1,178.5* >4,500.0* 914.7*   Imaging   No results found.  Medications:     Scheduled Medications:  acetaminophen  650 mg Oral Q6H   amiodarone  200 mg Oral BID   apixaban  2.5 mg Oral BID   Chlorhexidine Gluconate Cloth  6 each Topical Daily   clopidogrel  75 mg Oral Daily   feeding supplement  237 mL Oral TID BM   fentaNYL (SUBLIMAZE) injection  50 mcg Intravenous Once    insulin aspart  0-15 Units Subcutaneous TID WC   lidocaine  1 patch Transdermal Q24H   lidocaine  1 patch Transdermal Q24H   nutrition supplement (JUVEN)  1 packet Oral BID BM   potassium chloride  40 mEq Oral BID   rosuvastatin  10 mg Oral QHS   sodium chloride flush  10-40 mL Intracatheter Q12H   sodium chloride flush  10-40 mL Intracatheter Q12H   sodium chloride flush  3 mL Intravenous Q12H   sodium chloride flush  3 mL Intravenous Q12H   spironolactone  12.5 mg Oral Daily    Infusions:  sodium chloride 250 mL (01/23/23 1235)   milrinone 0.125 mcg/kg/min (02/07/23 0811)   piperacillin-tazobactam (ZOSYN)  IV 3.375 g (02/07/23 0524)   PRN Medications: sodium chloride, acetaminophen, alum & mag hydroxide-simeth, hydrocortisone cream, ipratropium-albuterol, melatonin, nitroGLYCERIN, ondansetron (ZOFRAN) IV, mouth rinse, oxyCODONE, simethicone, sodium chloride flush, sodium chloride flush, traMADol  Assessment/Plan   1. Cardiogenic shock/acute on chronic systolic CHF: Echo this admission with EF 20-25%, mild LV dilation, mild RV dysfunction, severe MR with restricted posterior leaflet.  Echo in 2/23 with EF 50-55%.  Patient has history of nonischemic cardiomyopathy (?PVC-mediated) that improved.  She reports 2 months of dyspnea then had lateral STEMI 1/30 with chest pain.  PLOM occlusion not thought to explain the extent of her cardiomyopathy, so suspect EF may have been falling prior to this event (symptomatic at least 2 months).  Suspect mixed ischemic/nonischemic cardiomyopathy. She initially had IABP for cardiogenic shock, now removed due to right leg ischemia. Was weaned off pressors/inotropes but Milrinone restarted 2/23 with volume overload and poor diuresis.   - Volume status improved.  CVP 4. Hold lasix again today w/ AKI.    Continue Milrinone 0.125. Co-ox 81%.  Continue at current rate today for renal perfusion. If SCr trending back down tomorrow will wean off.  - Continue  spironolactone 12.5 daily.   - Hold off on SGLT2i given groin infection.  - additional GDMT initially limited by hypotension and AKI, now off midodrine.  - S/p  DDD ICD.  - Strict I&O, daily weights 2. Mitral regurgitation: severe MR with restricted posterior leaflet on initial echo, suspect infarct-related MR with PLOM occlusion. TEE was later done showing moderate MR.  3. CAD: Lateral STEMI with occluded pLOM, treated with DES.   - Off ASA given apixaban use.  - Switched from Brilinta to plavix.  - Continue statin.  4. 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  - Continue amio 200 mg twice a day.   5. Right femoral PSA: s/p thrombin injection by Dr. Carlis Abbott. 2/4 stat u/s with recurrent PSA with active bleeding into PSA. S/p PSA repair  with VAC placement. 2/15 VAC change with sartorious muscle flap.  Vascular following. Went to OR 2/26 for washout and wound vac placement.   - She is on Zosyn for wound infection.  6. AKI on CKD stage 3b: Baseline 1.3-1.5.  Creatinine trended up to 2.06. Milrinone restarted for low output. SCr 1.6 today  7.  Acute blood loss anemia:  Hgb stable.  8. Atrial fibrillation: Paroxysmal. Maintaining SR.  - Amiodarone 200 mg bid  - Continue apixaban 2.5 bid.  9. VT Arrest: 2/7, DCCV x 1 with CPR, ROSC after 2 minutes.  Polymorphic VT, appeared bradycardia-mediated with intermittent junctional rhythm. - s/p DDD ICD on 2/12 - Continue amiodarone.  Lyda Jester PA-C  02/07/2023  Patient seen and examined with the above-signed Advanced Practice Provider and/or Housestaff. I personally reviewed laboratory data, imaging studies and relevant notes. I independently examined the patient and formulated the important aspects of the plan. I have edited the note to reflect any of my changes or salient points. I have personally discussed the plan with the patient and/or family.  Volume status improved. Now off lasix. Remains on milrinone. Denies  CP or SOB. Asking for [place] to changer her diet back to regular because she does like the taste of heart healthy diet.   Remains on abx. Afebrile.   General:  Weak appearing. No resp difficulty HEENT: normal Neck: supple. no JVD. Carotids 2+ bilat; no bruits. No lymphadenopathy or thryomegaly appreciated. Cor: PMI nondisplaced. Regular rate & rhythm. 2/6 MR Lungs: clear Abdomen: soft, nontender, nondistended. No hepatosplenomegaly. No bruits or masses. Good bowel sounds. Extremities: no cyanosis, clubbing, rash, 1+ edema + r groin wound vac Neuro: alert & orientedx3, cranial nerves grossly intact. moves all 4 extremities w/o difficulty. Affect pleasant  Continue milrinone one more day. Can stop if Scr improving. Hold diuretics. Continue abx and wound vac.   Remains in NSR on po amio and Eliquis. PT recommending CIR. I think we can have her ready in next 2-3 days from HF standpoint.   Glori Bickers, MD  10:39 PM

## 2023-02-07 NOTE — Progress Notes (Signed)
Physical Therapy Treatment Patient Details Name: Natasha Woodard MRN: [id number] DOB: [birth year] Today's Date: 02/07/2023   History of Present Illness 82 yo female admitted 1/30 with chest pain, NSTEMI s/p cardiac cath and IABP placed. Pt with bleeding and impaired circulation with IABP removed 1/31 and thrombin injection to pseudoaneurysm. 2/4 Rt groin pseudoaneurysm rupture s/p repair with placement of wound VAC. Pt with VT arrest on 2/8. ICD placed 2/12. Pt with rt groin washout and debridement and sartorius muscle flap with VAC placement on 2/15. PMhx: CAD, dilated cardiomyopathy, CKD, GERD, HFpEF, HTN, HLD, T2DM.    PT Comments    Pt received in supine, spouse present and encouraging, pt agreeable to therapy session and with good participation and tolerance for transfer and gait training. Increased time needed to perform all tasks due to pt pain, hard of hearing and difficulty sequencing with need for dense cues for safe body mechanics. Pt with excellent participation and effort for all tasks, pt able to stand ~7 total reps and performed stand pivot transfers x3 in addition to household distance gait trial. SpO2 desat to low 80's with initial standing trial on RA, SpO2 reading WFL on 3L O2 [place] with exertional tasks and HR to 130's bpm with exertion (noisy signal however). Reviewed stair sequencing with pt and she was agreeable to attempt next session, she defers today post-ambulation as she planned to work with OT after PT session. Pt continues to benefit from PT services to progress toward functional mobility goals, she remains a good candidate for short term post-acute high intensity therapies.    Recommendations for follow up therapy are one component of a multi-disciplinary discharge planning process, led by the attending physician.  Recommendations may be updated based on patient status, additional functional criteria and insurance authorization.  Follow Up Recommendations  Acute inpatient  rehab (3hours/day)     Assistance Recommended at Discharge Frequent or constant Supervision/Assistance  Patient can return home with the following A little help with walking and/or transfers;A little help with bathing/dressing/bathroom;Help with stairs or ramp for entrance;Assist for transportation;Assistance with cooking/housework   Equipment Recommendations  Rolling walker (2 wheels)    Recommendations for Other Services       Precautions / Restrictions Precautions Precautions: Fall;ICD/Pacemaker;Other (comment) Precaution Comments: VAC rt groin Restrictions Weight Bearing Restrictions: No     Mobility  Bed Mobility Overal bed mobility: Needs Assistance Bed Mobility: Supine to Sit     Supine to sit: HOB elevated, Min guard     General bed mobility comments: increased time, heavy use of rail, HOB up, PTA assist with lines.    Transfers Overall transfer level: Needs assistance Equipment used: Rolling walker (2 wheels) Transfers: Sit to/from Stand Sit to Stand: Min assist   Step pivot transfers: Min assist, +2 safety/equipment       General transfer comment: pt attempted to stand from bed height with min guard, but was unable to raise hips off bed. With dense cues, use of momentum strategy and minA +1, pt able to stand fully upright; spouse present and assisting to move IV pole due to multiple lines and PTA need to physically assist pt while she pivoted with RW.    Ambulation/Gait Ambulation/Gait assistance: +2 safety/equipment, Min assist Gait Distance (Feet): 100 Feet Assistive device: Rolling walker (2 wheels) Gait Pattern/deviations: Step-through pattern, Trunk flexed, Drifts right/left, Decreased stride length Gait velocity: decr Gait velocity interpretation: <1.31 ft/sec, indicative of household ambulator   General Gait Details: Min assist intermittently for posture, RW  management. Dense cues for upright posture and RW management to avoid obstacles. Pt  required 2 prolonged standing recovery breaks. SpO2 initially desat to 80's on RA, so pt placed on 2L O2 [place], SpO2 reading 88% while pt standing, then improves to 91% and greater on 3L/min. HR tachy to 130's bpm briefly with exertional tasks, decreases to 90's bpm after pt takes seated break.   Stairs Stairs:  (reviewed safe sequencing verbally, pt too fatigued to attempt today)           Wheelchair Mobility    Modified Rankin (Stroke Patients Only)       Balance Overall balance assessment: Mild deficits observed, not formally tested Sitting-balance support: Single extremity supported Sitting balance-Leahy Scale: Fair     Standing balance support: Bilateral upper extremity supported, Reliant on assistive device for balance Standing balance-Leahy Scale: Poor Standing balance comment: moderate to heavy BUE reliance on RW support                            Cognition Arousal/Alertness: Awake/alert Behavior During Therapy: WFL for tasks assessed/performed Overall Cognitive Status: Impaired/Different from baseline Area of Impairment: Memory, Following commands, Problem solving                   Current Attention Level: Sustained Memory: Decreased short-term memory Following Commands: Follows one step commands with increased time Safety/Judgement: Decreased awareness of safety, Decreased awareness of deficits Awareness: Emergent Problem Solving: Slow processing General Comments: Pt very HoH (MD ordered ear drops for wax issues). Pt oriented and follows commands well, decreased problem solving and awareness noted functionally, decreased carryover of cues for safe UE placement within session.        Exercises      General Comments General comments (skin integrity, edema, etc.): BP WFL supine and standing at bedside, no dizziness reported. HR 74 bpm resting, to 90's bpm with transfers/sitting, and up to 130 bpm with standing (difficult to see due to noisy tele  signal and PTA assisting pt so unsure exact number, but it was >130 and <140).      Pertinent Vitals/Pain Pain Assessment Pain Assessment: 0-10 Pain Score: 8  Pain Location: R groin, generalized Pain Descriptors / Indicators: Sore, Grimacing Pain Intervention(s): Monitored during session, Repositioned, Limited activity within patient's tolerance, Other (comment) (RN called to room as pt requesting pain meds, then when RN brought oxycodone PO for her, pt refused to take it, stating she doesn't want to take opioids. Pt states she only will take Tylenol or Tramadol, but she is not yet due for either.)     PT Goals (current goals can now be found in the care plan section) Acute Rehab PT Goals Patient Stated Goal: return home PT Goal Formulation: With patient Time For Goal Achievement: 02/10/23 Progress towards PT goals: Progressing toward goals    Frequency    Min 3X/week      PT Plan Current plan remains appropriate       AM-PAC PT "6 Clicks" Mobility   Outcome Measure  Help needed turning from your back to your side while in a flat bed without using bedrails?: A Little Help needed moving from lying on your back to sitting on the side of a flat bed without using bedrails?: A Lot (from flat bed/no rails) Help needed moving to and from a bed to a chair (including a wheelchair)?: A Little Help needed standing up from a chair using your arms (  e.g., wheelchair or bedside chair)?: A Little Help needed to walk in hospital room?: A Little Help needed climbing 3-5 steps with a railing? : Total 6 Click Score: 15    End of Session Equipment Utilized During Treatment: Gait belt;Oxygen Activity Tolerance: Patient tolerated treatment well Patient left: with call bell/phone within reach;with family/visitor present;in chair (spouse in room with her) Nurse Communication: Mobility status PT Visit Diagnosis: Unsteadiness on feet (R26.81);Other abnormalities of gait and mobility  (R26.89);Muscle weakness (generalized) (M62.81)     Time: [id number] PT Time Calculation (min) (ACUTE ONLY): 49 min  Charges:  $Gait Training: 8-22 mins $Therapeutic Activity: 23-37 mins                     Felecia Stanfill P., PTA Acute Rehabilitation Services Secure Chat Preferred 9a-5:30pm Office: [place] 02/07/2023, 3:02 PM

## 2023-02-07 NOTE — Progress Notes (Signed)
PROGRESS NOTE  Natasha Woodard  DOB: 04-20-1941  PCP: Tonia Ghent, MD [id number]  DOA: 01/10/2023  LOS: 26 days  Hospital Day: 29  Brief narrative: Natasha Woodard is a 82 y.o. female with PMH significant for DM2, HTN, HLD, nonischemic cardiomyopathy secondary to PVCs suppressed on low-dose amiodarone with ultimate recovery of EF. 1/30, patient presented to the ED with progressively worsening chest pain, worsening shortness of breath.  Troponin went up to over 2000.  She was given IV Lasix, stat echo showed EF of 20 to 25% with wall motion abnormalities.  She underwent urgent cardiac catheterization.  She was found to have acute lateral wall MI secondary to distal LCx occlusion complicated by severe ischemic MR and acute systolic heart failure with low output (EF 20 to 25%).  She required placement of IABP which was later removed given concern of developing limb ischemia.  1/31, she was found to have right femoral pseudoaneurysm was ultimately ruptured for which she required emergent repair on 2/4. Postcardiac cath, she was started on amiodarone drip to suppress PVCs. 2/8, she had VT arrest, ROSC achieved after a shock.  She was started on milrinone drip. 2/12, underwent ICD placement. 2/14, milrinone and amiodarone stopped. 2/15, returned to OR for washout and VAC change with sartorious muscle flap.  2/17: Transferred from heart failure team to [facility] service 2/20: Back to the OR for right groin washout, debridement, wound VAC replacement 2/22: Milrinone drip started again.  Subjective: Patient was seen and examined this morning Lying on bed.  Not in distress.  No new symptoms.  Husband at bedside. Underwent right groin wound VAC change in OR yesterday. Remains on milrinone drip.  Assessment and plan: Acute lateral wall MI  Presented with progressively worsening shortness of breath noted distal occlusion of the circumflex marginal vessel complicated by severe ischemic MR and  acute systolic CHF treated with DES.   Continue Eliquis, plavix and statin.  Cardiogenic shock Severe ischemic MR and acute systolic CHF Echo [id number] w/ EF 20-25%, mildly reduced RV function, severe MR During cardiac cath, patient developed cardiogenic shock, initially required IABP which was later removed given concern of developing limb ischemia. TEE 2/7 confirmed severe MR ICD placement 2/12 She was started on diuresis with IV Lasix, milrinone drip. Heart failure team following.  Lasix currently on hold.  Continue milrinone. Creatinine slightly elevated today.  Paroxysmal atrial fibrillation VT arrest 2/7 S/p DCCV x 1 with CPR, ROSC after 2 minutes.  Polymorphic VT, appeared bradycardia-mediated with intermittent junctional rhythm. s/p DDD ICD on 2/12 Currently on amiodarone 200 mg twice daily. 2/21, cardiovascular surgery okay with reinitiation of Eliquis.   Right femoral pseudoaneurysm Right groin wound, secondary infection Patient developed pseudoaneurysm following balloon pump, s/p thrombin injection by Dr. Carlis Abbott 1/31.  2/4 stat u/s showed recurrent PSA with active bleeding into PSA. S/p PSA repair with VAC placement.  2/15 VAC change with sartorious muscle flap.  2/20, went to the OR for washout and wound VAC placement. 2/27, underwent wound VAC change in OR.  Tentative plan of wound VAC change at bedside on 3/1. Wound culture showed polymicrobial growth showing moderate Morganella and Enterococcus with few  pseudomonas.   Continue IV Zosyn.     AKI on CKD stage 3b Baseline creatinine 1.3-1.5.  Creatinine peaked at 2.06 on 2/13.  Currently fluctuating while on diuresis. Recent Labs    01/29/23 [id number] 01/30/23 [id number] 01/31/23 0730 02/01/23 0805 02/02/23 0715 02/03/23 [id number] 02/04/23 [id number] 02/05/23 [id number] 02/06/23 [id number]  02/07/23 0338  BUN 41* 37* 32* 37* 37* 36* 38* 38* 37* 37*  CREATININE 1.52* 1.47* 1.34* 1.62* 1.74* 1.68* 1.69* 1.49* 1.50* 1.62*     Chronic  anemia Severe iron deficiency Secondary to blood loss and iron deficiency given IV iron 2/18 Hemoglobin trend as below.  Stable stable.  Improved number probably because of hemoconcentration after significant diuresis Recent Labs    01/13/23 0857 01/13/23 0858 01/13/23 1959 02/03/23 0614 02/04/23 0051 02/05/23 0621 02/06/23 0449 02/07/23 0338  HGB  --   --    < > 8.7* 9.2* 9.5* 10.4* 10.1*  MCV  --   --    < > 102.8* 103.7* 101.0* 101.5* 102.8*  VITAMINB12 298  --   --   --   --   --   --   --   FOLATE >40.0  --   --   --   --   --   --   --   FERRITIN  --  19  --   --   --   --   --   --   TIBC  --  276  --   --   --   --   --   --   IRON  --  14*  --   --   --   --   --   --   RETICCTPCT  --  4.6*  --   --   --   --   --   --    < > = values in this interval not displayed.    Earwax Patient complains of compaction of both ears with wax.  She states when she goes for evaluation of her hearing aid, the technician cleans her for ears.  She is asking for wax removal while in the hospital. I have ordered for hydrogen peroxide eardrops.  I have also advised nursing staff to use earbuds to remove the wax after that.  Mobility: PT eval obtained.  CIR recommended.  Goals of care   Code Status: Full Code     DVT prophylaxis:  apixaban (ELIQUIS) tablet 2.5 mg Start: 02/02/23 1400 Place TED hose Start: 02/02/23 1353 SCD's Start: 01/11/23 0410 apixaban (ELIQUIS) tablet 2.5 mg   Antimicrobials: IV Zosyn Fluid: None Consultants: Vascular surgery, cardiology Family Communication: Husband at bedside  Status is: Inpatient Level of care: Progressive   Dispo: Patient is from: Home              Anticipated d/c is to: Pending clinical course, probably CIR Continue in-hospital care because: Not any plan for wound VAC change at bedside on 3/1.   Scheduled Meds:  acetaminophen  650 mg Oral Q6H   amiodarone  200 mg Oral BID   apixaban  2.5 mg Oral BID   carbamide peroxide  5 drop  Both EARS BID   Chlorhexidine Gluconate Cloth  6 each Topical Daily   clopidogrel  75 mg Oral Daily   feeding supplement  237 mL Oral TID BM   fentaNYL (SUBLIMAZE) injection  50 mcg Intravenous Once   insulin aspart  0-15 Units Subcutaneous TID WC   lidocaine  1 patch Transdermal Q24H   lidocaine  1 patch Transdermal Q24H   nutrition supplement (JUVEN)  1 packet Oral BID BM   rosuvastatin  10 mg Oral QHS   sodium chloride flush  10-40 mL Intracatheter Q12H   sodium chloride flush  10-40 mL Intracatheter Q12H   sodium chloride  flush  3 mL Intravenous Q12H   sodium chloride flush  3 mL Intravenous Q12H   spironolactone  12.5 mg Oral Daily    PRN meds: sodium chloride, acetaminophen, alum & mag hydroxide-simeth, hydrocortisone cream, ipratropium-albuterol, melatonin, nitroGLYCERIN, ondansetron (ZOFRAN) IV, mouth rinse, oxyCODONE, simethicone, sodium chloride flush, sodium chloride flush, traMADol   Infusions:   sodium chloride 250 mL (01/23/23 1235)   milrinone 0.125 mcg/kg/min (02/07/23 0811)   piperacillin-tazobactam (ZOSYN)  IV 3.375 g (02/07/23 1303)    Diet:  Diet Order             Diet heart healthy/carb modified Room service appropriate? Yes; Fluid consistency: Thin  Diet effective now                   Antimicrobials: Anti-infectives (From admission, onward)    Start     Dose/Rate Route Frequency Ordered Stop   02/01/23 1700  vancomycin (VANCOCIN) IVPB 1000 mg/200 mL premix  Status:  Discontinued        1,000 mg 200 mL/hr over 60 Minutes Intravenous Every 48 hours 01/30/23 1548 01/31/23 1405   01/31/23 1400  cefTAZidime (FORTAZ) 2 g in sodium chloride 0.9 % 100 mL IVPB  Status:  Discontinued        2 g 200 mL/hr over 30 Minutes Intravenous Every 24 hours 01/30/23 1539 01/30/23 1548   01/31/23 1032  ceFAZolin 1 g / gentamicin 80 mg in NS 500 mL surgical irrigation  Status:  Discontinued          As needed 01/31/23 1033 01/31/23 1046   01/31/23 0600   piperacillin-tazobactam (ZOSYN) IVPB 3.375 g        3.375 g 12.5 mL/hr over 240 Minutes Intravenous Every 8 hours 01/30/23 1606     01/30/23 1800  piperacillin-tazobactam (ZOSYN) IVPB 3.375 g  Status:  Discontinued        3.375 g 12.5 mL/hr over 240 Minutes Intravenous Every 8 hours 01/30/23 1556 01/30/23 1606   01/30/23 1700  piperacillin-tazobactam (ZOSYN) IVPB 3.375 g  Status:  Discontinued        3.375 g 12.5 mL/hr over 240 Minutes Intravenous STAT 01/30/23 1606 01/30/23 1609   01/30/23 1700  cefTAZidime (FORTAZ) 2 g in sodium chloride 0.9 % 100 mL IVPB        2 g 200 mL/hr over 30 Minutes Intravenous  Once 01/30/23 1610 01/30/23 2131   01/30/23 1600  cefTAZidime (FORTAZ) 2 g in sodium chloride 0.9 % 100 mL IVPB  Status:  Discontinued        2 g 200 mL/hr over 30 Minutes Intravenous Every 24 hours 01/30/23 1548 01/30/23 1556   01/30/23 1400  cefTAZidime (FORTAZ) 2 g in sodium chloride 0.9 % 100 mL IVPB  Status:  Discontinued        2 g 200 mL/hr over 30 Minutes Intravenous Every 8 hours 01/30/23 1028 01/30/23 1539   01/30/23 1115  vancomycin (VANCOREADY) IVPB 1500 mg/300 mL  Status:  Discontinued        1,500 mg 150 mL/hr over 120 Minutes Intravenous  Once 01/30/23 1028 01/30/23 1556   01/29/23 2200  ceFAZolin (ANCEF) IVPB 2g/100 mL premix        2 g 200 mL/hr over 30 Minutes Intravenous 2 times daily 01/29/23 1335 01/29/23 2128   01/26/23 0000  vancomycin (VANCOCIN) IVPB 1000 mg/200 mL premix        1,000 mg 200 mL/hr over 60 Minutes Intravenous On call 01/25/23 0844 01/26/23 0944  01/24/23 2200  ceFAZolin (ANCEF) IVPB 2g/100 mL premix  Status:  Discontinued        2 g 200 mL/hr over 30 Minutes Intravenous 2 times daily 01/24/23 1023 01/29/23 1335   01/24/23 0900  ceFAZolin (ANCEF) IVPB 1 g/50 mL premix  Status:  Discontinued        1 g 100 mL/hr over 30 Minutes Intravenous 2 times daily 01/24/23 0823 01/24/23 1023   01/24/23 0430  ceFAZolin (ANCEF) IVPB 1 g/50 mL premix         1 g 100 mL/hr over 30 Minutes Intravenous Every 12 hours 01/23/23 1942 01/24/23 0524   01/23/23 0800  ceFAZolin (ANCEF) IVPB 2g/100 mL premix        2 g 200 mL/hr over 30 Minutes Intravenous On call 01/23/23 0011 01/23/23 1710   01/23/23 0100  gentamicin (GARAMYCIN) 80 mg in sodium chloride 0.9 % 500 mL irrigation        80 mg Irrigation On call 01/23/23 0011 01/23/23 1749   01/10/23 1958  ceFAZolin (ANCEF) IVPB 2 g/50 mL premix        over 30 Minutes  Continuous PRN 01/10/23 1958 01/10/23 2036       Skin assessment:       Nutritional status:  Body mass index is 27.66 kg/m.  Nutrition Problem: Moderate Malnutrition Etiology: acute illness Signs/Symptoms: mild fat depletion, mild muscle depletion, moderate muscle depletion     Objective: Vitals:   02/07/23 0714 02/07/23 1132  BP: (!) 120/52 101/63  Pulse:    Resp: 16 20  Temp:  98.4 F (36.9 C)  SpO2:      Intake/Output Summary (Last 24 hours) at 02/07/2023 1318 Last data filed at 02/07/2023 0700 Gross per 24 hour  Intake 363.52 ml  Output 25 ml  Net 338.52 ml    Filed Weights   02/06/23 1037 02/06/23 1053 02/07/23 0500  Weight: 66.9 kg 66.9 kg 68.6 kg   Weight change: -0.006 kg Body mass index is 27.66 kg/m.   Physical Exam: General exam: Pleasant elderly African-American female.  Not in pain  Skin: No rashes, lesions or ulcers.  Chronic ichthyosis HEENT: Atraumatic, normocephalic, no obvious bleeding.  Earwax noted on both external auditory canals Lungs: Clear to auscultation bilaterally CVS: Regular rate and rhythm, no murmur GI/Abd soft, nontender, nondistended, bowel sound present CNS: Alert, awake, oriented x 3.  Hard of hearing Psychiatry: Mood appropriate Extremities: Bilateral pedal edema seem slightly improved..  No calf tenderness.  Data Review: I have personally reviewed the laboratory data and studies available.  F/u labs ordered Unresulted Labs (From admission, onward)     Start      Ordered   02/03/23 0500  Cooxemetry Panel (carboxy, met, total hgb, O2 sat)  Daily,   R     Question:  Specimen collection method  Answer:  Lab=Lab collect   02/02/23 1352   02/02/23 [id number]  Basic metabolic panel  Daily,   R     Question:  Specimen collection method  Answer:  Lab=Lab collect   02/01/23 1030   02/02/23 0500  CBC  Daily,   R     Question:  Specimen collection method  Answer:  Lab=Lab collect   02/01/23 1030   01/26/23 1112  Acid Fast Culture with reflexed sensitivities  RELEASE UPON ORDERING,   TIMED       Comments: Specimen A: Pre-op diagnosis: RIGHT GROIN WOUND    01/26/23 1112   01/26/23 0500  Magnesium  Daily,  R     Question:  Specimen collection method  Answer:  Lab=Lab collect   01/25/23 0754            Total time spent in review of labs and imaging, patient evaluation, formulation of plan, documentation and communication with family: 39 minutes  Signed, Terrilee Croak, MD [facility] 02/07/2023

## 2023-02-07 NOTE — Progress Notes (Signed)
Occupational Therapy Treatment Patient Details Name: Natasha Woodard MRN: [id number] DOB: March 31, 1941 Today's Date: 02/07/2023   History of present illness 82 yo female admitted 1/30 with chest pain, NSTEMI s/p cardiac cath and IABP placed. Pt with bleeding and impaired circulation with IABP removed 1/31 and thrombin injection to pseudoaneurysm. 2/4 Rt groin pseudoaneurysm rupture s/p repair with placement of wound VAC. Pt with VT arrest on 2/8. ICD placed 2/12. Pt with rt groin washout and debridement and sartorius muscle flap with VAC placement on 2/15. PMhx: CAD, dilated cardiomyopathy, CKD, GERD, HFpEF, HTN, HLD, T2DM.   OT comments  Pt progressing well towards goals this session, needing min guard A for standing grooming task and sink and simulated toilet transfer in room with RW. Pt with decreased cognition, asking what we are doing after stating plan for session. VSS throughout session on supplemental O2. Pt presenting with impairments listed below, will follow acutely. Continue to recommend AIR at d/c.   Recommendations for follow up therapy are one component of a multi-disciplinary discharge planning process, led by the attending physician.  Recommendations may be updated based on patient status, additional functional criteria and insurance authorization.    Follow Up Recommendations  Acute inpatient rehab (3hours/day)     Assistance Recommended at Discharge Frequent or constant Supervision/Assistance  Patient can return home with the following  A lot of help with bathing/dressing/bathroom;Assistance with cooking/housework;Direct supervision/assist for medications management;Direct supervision/assist for financial management;Assist for transportation;Help with stairs or ramp for entrance;Two people to help with walking and/or transfers   Equipment Recommendations  None recommended by OT    Recommendations for Other Services      Precautions / Restrictions Precautions Precautions:  Fall;ICD/Pacemaker;Other (comment) Precaution Comments: VAC rt groin Restrictions Weight Bearing Restrictions: No       Mobility Bed Mobility               General bed mobility comments: OOB in chair upon arrival and departure    Transfers Overall transfer level: Needs assistance Equipment used: Rolling walker (2 wheels) Transfers: Sit to/from Stand Sit to Stand: Min assist           General transfer comment: hands on knees to stand from chair level surface     Balance Overall balance assessment: Needs assistance Sitting-balance support: Feet supported Sitting balance-Leahy Scale: Fair Sitting balance - Comments: sitting up in chair   Standing balance support: Bilateral upper extremity supported, Reliant on assistive device for balance Standing balance-Leahy Scale: Poor Standing balance comment: moderate to heavy BUE reliance on RW support                           ADL either performed or assessed with clinical judgement   ADL       Grooming: Oral care;Min guard;Standing Grooming Details (indicate cue type and reason): standing at sink                 Toilet Transfer: Min guard;Ambulation;Regular Toilet;Rolling walker (2 wheels)           Functional mobility during ADLs: Min guard;Rolling walker (2 wheels)      Extremity/Trunk Assessment Upper Extremity Assessment Upper Extremity Assessment: Generalized weakness (pacemaker prec)   Lower Extremity Assessment Lower Extremity Assessment: Defer to PT evaluation        Vision   Vision Assessment?: No apparent visual deficits   Perception Perception Perception: Within Functional Limits   Praxis Praxis Praxis: Intact  Cognition Arousal/Alertness: Awake/alert Behavior During Therapy: WFL for tasks assessed/performed Overall Cognitive Status: Impaired/Different from baseline Area of Impairment: Memory, Following commands, Problem solving                   Current  Attention Level: Sustained Memory: Decreased short-term memory Following Commands: Follows one step commands with increased time Safety/Judgement: Decreased awareness of safety, Decreased awareness of deficits Awareness: Emergent Problem Solving: Slow processing General Comments: when asked if she wears oxygen at home, pt states "how would i know that?"        Exercises      Shoulder Instructions       General Comments VSS on supplemental O2, SpO2 with poor wave pleth but with therapist pulse ox reading in mid-high 90s    Pertinent Vitals/ Pain       Pain Assessment Pain Assessment: Faces Pain Score: 4  Faces Pain Scale: Hurts little more Pain Location: R groin, generalized Pain Descriptors / Indicators: Sore, Grimacing Pain Intervention(s): Limited activity within patient's tolerance, Monitored during session, Repositioned  Home Living                                          Prior Functioning/Environment              Frequency  Min 2X/week        Progress Toward Goals  OT Goals(current goals can now be found in the care plan section)  Progress towards OT goals: Progressing toward goals  Acute Rehab OT Goals Patient Stated Goal: none stated OT Goal Formulation: With patient Time For Goal Achievement: 02/10/23 Potential to Achieve Goals: Good ADL Goals Pt Will Perform Grooming: sitting;with set-up Pt Will Perform Lower Body Dressing: sit to/from stand;with min assist Pt Will Transfer to Toilet: with min guard assist;stand pivot transfer;bedside commode Pt Will Perform Toileting - Clothing Manipulation and hygiene: with min assist;sit to/from stand  Plan Discharge plan remains appropriate    Co-evaluation                 AM-PAC OT "6 Clicks" Daily Activity     Outcome Measure   Help from another person eating meals?: None Help from another person taking care of personal grooming?: A Little Help from another person toileting,  which includes using toliet, bedpan, or urinal?: A Lot Help from another person bathing (including washing, rinsing, drying)?: A Lot Help from another person to put on and taking off regular upper body clothing?: A Lot Help from another person to put on and taking off regular lower body clothing?: A Lot 6 Click Score: 15    End of Session Equipment Utilized During Treatment: Gait belt;Rolling walker (2 wheels)  OT Visit Diagnosis: Other abnormalities of gait and mobility (R26.89);Muscle weakness (generalized) (M62.81);Pain Pain - Right/Left: Right Pain - part of body: Leg   Activity Tolerance Patient tolerated treatment well   Patient Left in chair;with call bell/phone within reach;with family/visitor present   Nurse Communication Mobility status        Time: [id number] OT Time Calculation (min): 28 min  Charges: OT General Charges $OT Visit: 1 Visit OT Treatments $Self Care/Home Management : 8-22 mins $Therapeutic Activity: 8-22 mins  Renaye Rakers, OTD, OTR/L SecureChat Preferred Acute Rehab [phone]   Renaye Rakers Koonce 02/07/2023, 3:52 PM

## 2023-02-08 DIAGNOSIS — I214 Non-ST elevation (NSTEMI) myocardial infarction: Secondary | ICD-10-CM | POA: Diagnosis not present

## 2023-02-08 LAB — COOXEMETRY PANEL
Carboxyhemoglobin: 2.9 % — ABNORMAL HIGH (ref 0.5–1.5)
Methemoglobin: 1.1 % (ref 0.0–1.5)
O2 Saturation: 72.3 %
Total hemoglobin: 10.1 g/dL — ABNORMAL LOW (ref 12.0–16.0)

## 2023-02-08 LAB — CBC
HCT: 33 % — ABNORMAL LOW (ref 36.0–46.0)
Hemoglobin: 9.9 g/dL — ABNORMAL LOW (ref 12.0–15.0)
MCH: 31.1 pg (ref 26.0–34.0)
MCHC: 30 g/dL (ref 30.0–36.0)
MCV: 103.8 fL — ABNORMAL HIGH (ref 80.0–100.0)
Platelets: 279 10*3/uL (ref 150–400)
RBC: 3.18 MIL/uL — ABNORMAL LOW (ref 3.87–5.11)
RDW: 20.5 % — ABNORMAL HIGH (ref 11.5–15.5)
WBC: 5.6 10*3/uL (ref 4.0–10.5)
nRBC: 0 % (ref 0.0–0.2)

## 2023-02-08 LAB — BASIC METABOLIC PANEL
Anion gap: 11 (ref 5–15)
BUN: 48 mg/dL — ABNORMAL HIGH (ref 8–23)
CO2: 33 mmol/L — ABNORMAL HIGH (ref 22–32)
Calcium: 9.5 mg/dL (ref 8.9–10.3)
Chloride: 97 mmol/L — ABNORMAL LOW (ref 98–111)
Creatinine, Ser: 1.55 mg/dL — ABNORMAL HIGH (ref 0.44–1.00)
GFR, Estimated: 33 mL/min — ABNORMAL LOW (ref >60)
Glucose, Bld: 107 mg/dL — ABNORMAL HIGH (ref 70–99)
Potassium: 4.4 mmol/L (ref 3.5–5.1)
Sodium: 141 mmol/L (ref 135–145)

## 2023-02-08 LAB — GLUCOSE, CAPILLARY
Glucose-Capillary: 120 mg/dL — ABNORMAL HIGH (ref 70–99)
Glucose-Capillary: 186 mg/dL — ABNORMAL HIGH (ref 70–99)
Glucose-Capillary: 143 mg/dL — ABNORMAL HIGH (ref 70–99)
Glucose-Capillary: 148 mg/dL — ABNORMAL HIGH (ref 70–99)

## 2023-02-08 LAB — MAGNESIUM: Magnesium: 2.1 mg/dL (ref 1.7–2.4)

## 2023-02-08 MED ORDER — ADULT MULTIVITAMIN W/MINERALS CH
1.0000 | ORAL_TABLET | Freq: Every day | ORAL | Status: DC
  Administered 2023-02-08 – 2023-02-19 (×12): 1 via ORAL
  Filled 2023-02-08 (×12): qty 1

## 2023-02-08 NOTE — Progress Notes (Signed)
Nutrition Follow-up  DOCUMENTATION CODES:   Non-severe (moderate) malnutrition in context of acute illness/injury  INTERVENTION:   Liberalize pt diet to regular due to malnutrition and increased needs for wound healing Continue Ensure Enlive po TID, each supplement provides 350 kcal and 20 grams of protein. Continue 1 packet Juven BID, each packet provides 95 calories, 2.5 grams of protein (collagen), and 9.8 grams of carbohydrate (3 grams sugar); also contains 7 grams of L-arginine and L-glutamine, 300 mg vitamin C, 15 mg vitamin E, 1.2 mcg vitamin B-12, 9.5 mg zinc, 200 mg calcium, and 1.5 g  Calcium Beta-hydroxy-Beta-methylbutyrate to support wound healing Multivitamin w/ minerals daily  NUTRITION DIAGNOSIS:   Moderate Malnutrition related to acute illness as evidenced by mild fat depletion, mild muscle depletion, moderate muscle depletion. - Ongoing   GOAL:   Patient will meet greater than or equal to 90% of their needs - Ongoing   MONITOR:   PO intake, Supplement acceptance, Labs, Weight trends  REASON FOR ASSESSMENT:   Consult Assessment of nutrition requirement/status  ASSESSMENT:   82 yo female admitted with NSTEMI requiring IABP, pt with bleed and impaired circulation with IABP removed and thrombin injection to pseudoaneurysm with subsequent rupture requiring repair and wound VAC placement. PMH includes NICM, CAD, DM, HLD, hiatal hermia with refux, Ichthyosis congenita, CKD  1/30 Admitted, IABP 1/31 Thrombin injection to right femoral PSA 2/04 Ruptured PSA-OR for repair 2/08 VT arrest 2/12 ICD placed 2/16 R groin washout with sartorius muscle flap and placement of wound VAC 2/20 s/p R groin washout, debridement, myriad morcell placement, VAC placement  Pt sitting up in the chair, son at bedside. Pt reports that she wants some fried chicken livers. Discussed with pt and son that we do not like to restrict foods that pt enjoys eating, reviewed eating them in  moderation once home and that every once in a while would be ok. Both were agreeable. Pt reports that she is still drinking the Ensure's and Juven. RD observed pt breakfast tray ~75% was consumed.   Meal Intake 2/22-2/27: 0-100% x 8 meals (average 41%)  Medications reviewed and include: NovoLog SSI, Spironolactone, IV antibiotics  Labs reviewed: Sodium 141, Potassium 4.4, BUN 48, Creatinine 1.55, Magnesium 2.1, 24 hr CBGs 120-186  Diet Order:   Diet Order             Diet regular Room service appropriate? Yes with Assist; Fluid consistency: Thin  Diet effective now                   EDUCATION NEEDS:   Not appropriate for education at this time  Skin:  Skin Assessment: Skin Integrity Issues: Skin Integrity Issues:: Wound VAC, Other (Comment) Wound Vac: R groin Other: ichthyosis congenita since birth  Last BM:  2/28  Height:  Ht Readings from Last 1 Encounters:  02/06/23 '5\' 2"'$  (1.575 m)   Weight:  Wt Readings from Last 1 Encounters:  02/08/23 68.2 kg   Ideal Body Weight:  50 kg  BMI:  Body mass index is 27.5 kg/m.  Estimated Nutritional Needs:  Kcal:  1600-1800 kcals Protein:  75-90 g Fluid:  >/= 1.6 L   Hermina Barters RD, LDN Clinical Dietitian See [facility] for contact information.

## 2023-02-08 NOTE — Progress Notes (Signed)
Inpatient Rehabilitation Admissions Coordinator   We have been following patient's progress from a distance since 2/16. I have met with son and husband twice thus far. I will follow up with patient  and family to solicit their rehab goals to assist in determining most appropirate rehab venue options.  Danne Baxter, RN, MSN Rehab Admissions Coordinator [phone] 02/08/2023 5:42 PM

## 2023-02-08 NOTE — Progress Notes (Signed)
  Progress Note    02/08/2023 7:45 AM 2 Days Post-Op  Subjective:  no complaints this morning   Vitals:   02/07/23 2318 02/08/23 0430  BP: (!) 116/58 116/61  Pulse: 70 70  Resp: 18 18  Temp: 98 F (36.7 C) 97.6 F (36.4 C)  SpO2: 96% 99%   Physical Exam: Lungs:  non labored Incisions:  R groin with vac in place; good seal Extremities:  feet warm and well perfused Abdomen:  soft, NT, ND Neurologic: A&O  CBC    Component Value Date/Time   WBC 5.6 02/08/2023 0500   RBC 3.18 (L) 02/08/2023 0500   HGB 9.9 (L) 02/08/2023 0500   HGB 14.2 10/09/2020 0935   HCT 33.0 (L) 02/08/2023 0500   HCT 44.9 10/09/2020 0935   PLT 279 02/08/2023 0500   PLT 379 10/09/2020 0935   MCV 103.8 (H) 02/08/2023 0500   MCV 91 10/09/2020 0935   MCH 31.1 02/08/2023 0500   MCHC 30.0 02/08/2023 0500   RDW 20.5 (H) 02/08/2023 0500   RDW 13.4 10/09/2020 0935   LYMPHSABS 0.9 01/15/2023 0950   LYMPHSABS 1.1 10/09/2020 0935   MONOABS 1.4 (H) 01/15/2023 0950   EOSABS 0.6 (H) 01/15/2023 0950   EOSABS 0.2 10/09/2020 0935   BASOSABS 0.1 01/15/2023 0950   BASOSABS 0.1 10/09/2020 0935    BMET    Component Value Date/Time   NA 141 02/08/2023 0500   NA 145 (H) 07/07/2022 1007   K 4.4 02/08/2023 0500   CL 97 (L) 02/08/2023 0500   CO2 33 (H) 02/08/2023 0500   GLUCOSE 107 (H) 02/08/2023 0500   BUN 48 (H) 02/08/2023 0500   BUN 24 07/07/2022 1007   CREATININE 1.55 (H) 02/08/2023 0500   CREATININE 1.28 (H) 01/14/2022 1551   CALCIUM 9.5 02/08/2023 0500   GFRNONAA 33 (L) 02/08/2023 0500   GFRAA 49 (L) 10/09/2020 0933    INR    Component Value Date/Time   INR 1.4 (H) 01/10/2023 2145     Intake/Output Summary (Last 24 hours) at 02/08/2023 0745 Last data filed at 02/08/2023 [id number] Gross per 24 hour  Intake 1041.99 ml  Output 1000 ml  Net 41.99 ml     Assessment/Plan:  82 y.o. female is s/p R groin debridement and vac change  2 Days Post-Op   R groin vac with good seal; minimal sanguinous  output Plan is for vac change at the bedside on Friday; she will kept NPO in case she is unable to tolerate and needs return to OR Continue participation with PT/OT; recommending CIR currently    Dagoberto Ligas, PA-C [facility] [phone] 02/08/2023 7:45 AM

## 2023-02-08 NOTE — Progress Notes (Signed)
Physical Therapy Treatment Patient Details Name: Natasha Woodard MRN: [id number] DOB: 1941-05-02 Today's Date: 02/08/2023   History of Present Illness 82 yo female admitted 1/30 with chest pain, NSTEMI s/p cardiac cath and IABP placed. Pt with bleeding and impaired circulation with IABP removed 1/31 and thrombin injection to pseudoaneurysm. 2/4 Rt groin pseudoaneurysm rupture s/p repair with placement of wound VAC. Pt with VT arrest on 2/8. ICD placed 2/12. Pt with rt groin washout and debridement and sartorius muscle flap with VAC placement on 2/15. PMhx: CAD, dilated cardiomyopathy, CKD, GERD, HFpEF, HTN, HLD, T2DM.    PT Comments    Pt received in supine, agreeable to therapy session and with good participation and tolerance for transfer and stair training via platform step in room. Gait distance limited due to bowel urgency and afterward pt requesting to sit up in recliner and visit with family members. Pt needing +2 minA for functional mobility tasks at this time with AD. Pt eager to progress to higher intensity post-acute therapies and remains a good candidate for CIR at this time, VSS on RA this date. Pt continues to benefit from PT services to progress toward functional mobility goals.    Recommendations for follow up therapy are one component of a multi-disciplinary discharge planning process, led by the attending physician.  Recommendations may be updated based on patient status, additional functional criteria and insurance authorization.  Follow Up Recommendations  Acute inpatient rehab (3hours/day)     Assistance Recommended at Discharge Frequent or constant Supervision/Assistance  Patient can return home with the following A little help with walking and/or transfers;A little help with bathing/dressing/bathroom;Help with stairs or ramp for entrance;Assist for transportation;Assistance with cooking/housework   Equipment Recommendations  Rolling walker (2 wheels)    Recommendations  for Other Services       Precautions / Restrictions Precautions Precautions: Fall;ICD/Pacemaker;Other (comment) Precaution Comments: VAC rt groin Restrictions Weight Bearing Restrictions: No     Mobility  Bed Mobility Overal bed mobility: Needs Assistance Bed Mobility: Rolling, Sidelying to Sit Rolling: Supervision Sidelying to sit: Min guard, HOB elevated       General bed mobility comments: increased time/effort to perform, pt using bed rails    Transfers Overall transfer level: Needs assistance Equipment used: Rolling walker (2 wheels) Transfers: Sit to/from Stand Sit to Stand: Min assist, +2 safety/equipment   Step pivot transfers: Min assist, +2 safety/equipment       General transfer comment: cues for UE placement prior to standing; pivot from BSC>chair after short gait trial in the room    Ambulation/Gait Ambulation/Gait assistance: +2 safety/equipment, Min assist Gait Distance (Feet): 10 Feet (x2 with seated break on BSC) Assistive device: Rolling walker (2 wheels) Gait Pattern/deviations: Step-through pattern, Trunk flexed, Drifts right/left, Decreased stride length       General Gait Details: Min assist intermittently for posture, RW management. Dense cues for upright posture. Distance limited due to pt bowel urgency and afterward, pt trialed stairs at bedside and was fatigued, but agreeable to sit up in recliner chair.   Stairs Stairs: Yes Stairs assistance: Min assist, +2 physical assistance, +2 safety/equipment Stair Management: Two rails, Step to pattern, Forwards Number of Stairs: 2 General stair comments: 7" platform step in the room, pt given initial cues and needed multimodal cues and increased time to perform, step-to pattern leading with her LLE. Foot taps on each LE prior to stepping up, pt performed step x2 trials after initial foot taps.   Wheelchair Mobility    Modified  Rankin (Stroke Patients Only)       Balance Overall balance  assessment: Needs assistance Sitting-balance support: Feet supported Sitting balance-Leahy Scale: Fair     Standing balance support: Bilateral upper extremity supported, Reliant on assistive device for balance Standing balance-Leahy Scale: Poor Standing balance comment: moderate to heavy BUE reliance on RW support                            Cognition Arousal/Alertness: Awake/alert Behavior During Therapy: WFL for tasks assessed/performed Overall Cognitive Status: Impaired/Different from baseline Area of Impairment: Memory, Following commands, Problem solving                   Current Attention Level: Sustained Memory: Decreased short-term memory Following Commands: Follows one step commands with increased time Safety/Judgement: Decreased awareness of safety, Decreased awareness of deficits Awareness: Emergent Problem Solving: Slow processing General Comments: HoH, increased time to process and respond to cues, pt able to recall this therapist and our session previous date. Pt eager to participate in therapy and states "I hope to go to therapy here on the 4th floor".        Exercises      General Comments General comments (skin integrity, edema, etc.): BP stable, SpO2 100% on RA, new sensor placed on her finger as it would not read today on her ear; HR 70-80's bpm      Pertinent Vitals/Pain Pain Assessment Pain Assessment: Faces Faces Pain Scale: Hurts a little bit Pain Location: R groin, generalized Pain Descriptors / Indicators: Sore Pain Intervention(s): Monitored during session, Repositioned     PT Goals (current goals can now be found in the care plan section) Acute Rehab PT Goals Patient Stated Goal: to get stronger at therapy prior to home PT Goal Formulation: With patient Time For Goal Achievement: 02/10/23 Progress towards PT goals: Progressing toward goals    Frequency    Min 3X/week      PT Plan Current plan remains appropriate        AM-PAC PT "6 Clicks" Mobility   Outcome Measure  Help needed turning from your back to your side while in a flat bed without using bedrails?: A Little Help needed moving from lying on your back to sitting on the side of a flat bed without using bedrails?: A Lot (without rails) Help needed moving to and from a bed to a chair (including a wheelchair)?: A Little Help needed standing up from a chair using your arms (e.g., wheelchair or bedside chair)?: A Little Help needed to walk in hospital room?: A Lot Help needed climbing 3-5 steps with a railing? : A Lot 6 Click Score: 15    End of Session Equipment Utilized During Treatment: Gait belt Activity Tolerance: Patient tolerated treatment well Patient left: in chair;with call bell/phone within reach;with chair alarm set;with family/visitor present Nurse Communication: Mobility status PT Visit Diagnosis: Unsteadiness on feet (R26.81);Other abnormalities of gait and mobility (R26.89);Muscle weakness (generalized) (M62.81)     Time: [id number] PT Time Calculation (min) (ACUTE ONLY): 35 min  Charges:  $Gait Training: 8-22 mins $Therapeutic Activity: 8-22 mins                     Lakie Mclouth P., PTA Acute Rehabilitation Services Secure Chat Preferred 9a-5:30pm Office: [place] 02/08/2023, 6:16 PM

## 2023-02-08 NOTE — Progress Notes (Addendum)
Patient ID: [name], female   DOB: [birth year], 82 y.o.   MRN: [id number]     Advanced Heart Failure Rounding Note  PCP-Cardiologist: Dr. Ellyn Woodard AHF: Dr. 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 Now admitted for acute lateral wall MI 2/2 distal LCx occlusion c/b severe ischemic MR and acute systolic heart failure w/ low output, CI 2.1 on RHC. Required placement of IABP, but later removed given concerns for developing limb ischemia=>found to have Right femoral PSA then ultimate rupture s/p emergent repair. VT arrest 2/8. ICD placed 2/12.  Subjective:   1/31 Underwent thrombin injection to right femoral PSA . Hgb drifting down 10.3>8.3>6.8  2/1 Started on amio drip to suppress PVCs. Got dose of IV lasix x1. CVP low.  2/4 Ruptured PSA. Returned to OR for repair. VAC in place 2/5 Midodrine 5 mg three times daily added.  Back in A fib.  2/7 TEE with mod MR.  2/8 VT arrest--> shock x1 ROSC after 2 minutes. Given lidocaine and amio was increased to 60 mg.  2/9 Transvenous pacemaker placed for overdrive pacing with PMVT.  2/11 Milrinone restarted, Co-ox 49% 2/12 PICC removed. S/p [employer] ICD  2/14 Milrinone stopped . Amio drip stopped.  2/15 Returned to OR for washout and VAC change with sartorious muscle flap.  2/20 OR for washout and wound vac placement.   2/26 OR groin washout, debridement and vac change   On milrinone 0.125. Co-ox 72%.    Diuretics remain on hold. Wt stable. Scr stable 1.55.   Feels well today. Denies CP. No dyspnea. Groin site stable. Main complaint is the food. Asking if she can liberalize diet and have her family bring in 1 desired meal.     Objective:   Weight Range: 68.2 kg Body mass index is 27.5 kg/m.   Vital Signs:   Temp:  [97.6 F (36.4 C)-98.4 F (36.9 C)] 98.3 F (36.8 C) (02/28 0803) Pulse Rate:  [69-81] 70 (02/28 0803) Resp:  [16-66] 18 (02/28  0430) BP: (91-116)/(58-65) 112/64 (02/28 0803) SpO2:  [92 %-100 %] 100 % (02/28 0803) Weight:  [68.2 kg] 68.2 kg (02/28 0430) Last BM Date : 02/07/23  Weight change: Filed Weights   02/06/23 1053 02/07/23 0500 02/08/23 0430  Weight: 66.9 kg 68.6 kg 68.2 kg    Intake/Output:   Intake/Output Summary (Last 24 hours) at 02/08/2023 0913 Last data filed at 02/08/2023 [id number] Gross per 24 hour  Intake 801.99 ml  Output 1000 ml  Net -198.01 ml    PHYSICAL EXAM: General:  Well appearing. No respiratory difficulty HEENT: normal Neck: supple. JVD 7 cm. Carotids 2+ bilat; no bruits. No lymphadenopathy or thyromegaly appreciated. Cor: PMI nondisplaced. Regular rate & rhythm. No rubs, gallops or murmurs. Lungs: clear Abdomen: soft, nontender, nondistended. No hepatosplenomegaly. No bruits or masses. Good bowel sounds. Extremities: no cyanosis, clubbing, rash, edema + RUE PICC + left groin wound vac  Neuro: alert & oriented x 3, cranial nerves grossly intact. moves all 4 extremities w/o difficulty. Affect pleasant.   Telemetry   A Paced 70s with occasional PVCs.   Labs    CBC Recent Labs    02/07/23 0338 02/08/23 0500  WBC 7.3 5.6  HGB 10.1* 9.9*  HCT 33.3* 33.0*  MCV 102.8* 103.8*  PLT 308 [id number]   Basic Metabolic Panel Recent Labs    02/07/23 0338 02/08/23 0500  NA 141  141  K 4.2 4.4  CL 92* 97*  CO2 33* 33*  GLUCOSE 141* 107*  BUN 37* 48*  CREATININE 1.62* 1.55*  CALCIUM 9.5 9.5  MG 2.0 2.1   BNP: BNP (last 3 results) Recent Labs    08/31/22 1613 01/10/23 1048 02/01/23 1239  BNP 1,178.5* >4,500.0* 914.7*   Imaging   No results found.  Medications:     Scheduled Medications:  acetaminophen  650 mg Oral Q6H   amiodarone  200 mg Oral BID   apixaban  2.5 mg Oral BID   carbamide peroxide  5 drop Both EARS BID   Chlorhexidine Gluconate Cloth  6 each Topical Daily   clopidogrel  75 mg Oral Daily   feeding supplement  237 mL Oral TID BM   fentaNYL  (SUBLIMAZE) injection  50 mcg Intravenous Once   insulin aspart  0-15 Units Subcutaneous TID WC   lidocaine  1 patch Transdermal Q24H   lidocaine  1 patch Transdermal Q24H   nutrition supplement (JUVEN)  1 packet Oral BID BM   rosuvastatin  10 mg Oral QHS   sodium chloride flush  10-40 mL Intracatheter Q12H   sodium chloride flush  10-40 mL Intracatheter Q12H   sodium chloride flush  3 mL Intravenous Q12H   sodium chloride flush  3 mL Intravenous Q12H   spironolactone  12.5 mg Oral Daily    Infusions:  sodium chloride 250 mL (01/23/23 1235)   milrinone 0.125 mcg/kg/min (02/08/23 0614)   piperacillin-tazobactam (ZOSYN)  IV 3.375 g (02/08/23 0525)   PRN Medications: sodium chloride, acetaminophen, alum & mag hydroxide-simeth, hydrocortisone cream, ipratropium-albuterol, melatonin, nitroGLYCERIN, ondansetron (ZOFRAN) IV, mouth rinse, oxyCODONE, simethicone, sodium chloride flush, sodium chloride flush, traMADol  Assessment/Plan   1. Cardiogenic shock/acute on chronic systolic CHF: Echo this admission with EF 20-25%, mild LV dilation, mild RV dysfunction, severe MR with restricted posterior leaflet.  Echo in 2/23 with EF 50-55%.  Patient has history of nonischemic cardiomyopathy (?PVC-mediated) that improved.  She reports 2 months of dyspnea then had lateral STEMI 1/30 with chest pain.  PLOM occlusion not thought to explain the extent of her cardiomyopathy, so suspect EF may have been falling prior to this event (symptomatic at least 2 months).  Suspect mixed ischemic/nonischemic cardiomyopathy. She initially had IABP for cardiogenic shock, now removed due to right leg ischemia. Was weaned off pressors/inotropes but Milrinone restarted 2/23 with volume overload and poor diuresis.   - Volume status improved.  Euvolemic on exam. Renal fx stable and back to b/l  - Stop Milrinone today. Follow co-ox and CVP  - Continue spironolactone 12.5 daily.   - Hold off on SGLT2i given groin infection.  -  additional GDMT initially limited by hypotension and AKI, now off midodrine.  - S/p  DDD ICD.  - Strict I&O, daily weights 2. Mitral regurgitation: severe MR with restricted posterior leaflet on initial echo, suspect infarct-related MR with PLOM occlusion. TEE was later done showing moderate MR.  3. CAD: Lateral STEMI with occluded pLOM, treated with DES.   - Off ASA given apixaban use.  - Switched from Brilinta to plavix.  - Continue statin.  4. 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  - Continue amio 200 mg twice a day.   5. Right femoral PSA: s/p thrombin injection by Dr. Carlis Woodard. 2/4 stat u/s with recurrent PSA with active bleeding into PSA. S/p PSA repair with VAC placement. 2/15 VAC change with sartorious muscle flap.  Vascular  following. Went to OR 2/26 for washout and wound vac placement.   - She is on Zosyn for wound infection.  6. AKI on CKD stage 3b: Baseline 1.3-1.5.  Creatinine trended up to 2.06. Milrinone restarted for low output. SCr 1.55 today  7.  Acute blood loss anemia:  Hgb stable.  8. Atrial fibrillation: Paroxysmal. Maintaining SR.  - Amiodarone 200 mg bid  - Continue apixaban 2.5 bid.  9. VT Arrest: 2/7, DCCV x 1 with CPR, ROSC after 2 minutes.  Polymorphic VT, appeared bradycardia-mediated with intermittent junctional rhythm. - s/p DDD ICD on 2/12 - Continue amiodarone.  Natasha Jester PA-C  02/08/2023  Patient seen and examined with the above-signed Advanced Practice Provider and/or Housestaff. I personally reviewed laboratory data, imaging studies and relevant notes. I independently examined the patient and formulated the important aspects of the plan. I have edited the note to reflect any of my changes or salient points. I have personally discussed the plan with the patient and/or family.   Remains in NSR. Volume status ok. Only complaint is the food. Wound vac working well no f/c   General:  Lying in bed  No resp  difficulty HEENT: normal Neck: supple. no JVD. Carotids 2+ bilat; no bruits. No lymphadenopathy or thryomegaly appreciated. Cor: PMI nondisplaced. Regular rate & rhythm. 2/6 MR Lungs: clear Abdomen: soft, nontender, nondistended. No hepatosplenomegaly. No bruits or masses. Good bowel sounds. Extremities: no cyanosis, clubbing, rash, tr -1+ edema wound vac in R groin  Neuro: alert & orientedx3, cranial nerves grossly intact. moves all 4 extremities w/o difficulty. Affect pleasant  Stable from HF perspective. Wil stop milrinone and watch co-ox for 24-48 hour. Adjust GDMT as tolerated.  Strongly recommend CIR for much needed rehab once cleared by [facility].   Natasha Bickers, MD  6:56 PM

## 2023-02-08 NOTE — Progress Notes (Signed)
PROGRESS NOTE  Natasha Woodard  DOB: 06-Nov-1941  PCP: Tonia Ghent, MD [id number]  DOA: 01/10/2023  LOS: 64 days  Hospital Day: 30  Brief narrative: Natasha Woodard is a 82 y.o. female with PMH significant for DM2, HTN, HLD, nonischemic cardiomyopathy secondary to PVCs suppressed on low-dose amiodarone with ultimate recovery of EF. 1/30, patient presented to the ED with progressively worsening chest pain, worsening shortness of breath.  Troponin went up to over 2000.  She was given IV Lasix, stat echo showed EF of 20 to 25% with wall motion abnormalities.  She underwent urgent cardiac catheterization.  She was found to have acute lateral wall MI secondary to distal LCx occlusion complicated by severe ischemic MR and acute systolic heart failure with low output (EF 20 to 25%).  She required placement of IABP which was later removed given concern of developing limb ischemia.  1/31, she was found to have right femoral pseudoaneurysm was ultimately ruptured for which she required emergent repair on 2/4. Postcardiac cath, she was started on amiodarone drip to suppress PVCs. 2/8, she had VT arrest, ROSC achieved after a shock.  She was started on milrinone drip. 2/12, underwent ICD placement. 2/14, milrinone and amiodarone stopped. 2/15, returned to OR for washout and VAC change with sartorious muscle flap.  2/17: Transferred from heart failure team to [facility] service 2/20: Back to the OR for right groin washout, debridement, wound VAC replacement 2/22: Milrinone drip started again.  Subjective: Patient was seen and examined this morning Lying on bed.  Not in distress.  No new symptoms.  Husband at bedside. Milrinone drip was stopped today.   Assessment and plan: Acute lateral wall MI  Presented with progressively worsening shortness of breath noted distal occlusion of the circumflex marginal vessel complicated by severe ischemic MR and acute systolic CHF treated with DES.   Continue  Eliquis, plavix and statin.  Cardiogenic shock Severe ischemic MR and acute systolic CHF Echo [id number] w/ EF 20-25%, mildly reduced RV function, severe MR During cardiac cath, patient developed cardiogenic shock, initially required IABP which was later removed given concern of developing limb ischemia. TEE 2/7 confirmed severe MR ICD placement 2/12 She was started on diuresis with IV Lasix, milrinone drip. Heart failure team following.  Lasix currently on hold.  Milrinone drip stopped today. Creatinine slightly elevated today.  Paroxysmal atrial fibrillation VT arrest 2/7 S/p DCCV x 1 with CPR, ROSC after 2 minutes.  Polymorphic VT, appeared bradycardia-mediated with intermittent junctional rhythm. s/p DDD ICD on 2/12 Currently on amiodarone 200 mg twice daily. 2/21, cardiovascular surgery okay with reinitiation of Eliquis.   Right femoral pseudoaneurysm Right groin wound, secondary infection Patient developed pseudoaneurysm following balloon pump, s/p thrombin injection by Dr. Carlis Abbott 1/31.  2/4 stat u/s showed recurrent PSA with active bleeding into PSA. S/p PSA repair with VAC placement.  2/15 VAC change with sartorious muscle flap.  2/20, went to the OR for washout and wound VAC placement. 2/27, underwent wound VAC change in OR.  Tentative plan of wound VAC change at bedside on 3/1. Wound culture showed polymicrobial growth showing moderate Morganella and Enterococcus with few  pseudomonas.   Continue IV Zosyn.     AKI on CKD stage 3b Baseline creatinine 1.3-1.5.  Creatinine peaked at 2.06 on 2/13.  Currently fluctuating while on diuresis. Recent Labs    01/30/23 [id number] 01/31/23 0730 02/01/23 0805 02/02/23 0715 02/03/23 [id number] 02/04/23 0051 02/05/23 [id number] 02/06/23 0449 02/07/23 0338 02/08/23 0500  BUN 37*  32* 37* 37* 36* 38* 38* 37* 37* 48*  CREATININE 1.47* 1.34* 1.62* 1.74* 1.68* 1.69* 1.49* 1.50* 1.62* 1.55*     Chronic anemia Severe iron deficiency Secondary to blood  loss and iron deficiency given IV iron 2/18 Hemoglobin trend as below.  Stable stable.  Improved number probably because of hemoconcentration after significant diuresis Recent Labs    01/13/23 0857 01/13/23 0858 01/13/23 1959 02/04/23 0051 02/05/23 0621 02/06/23 0449 02/07/23 0338 02/08/23 0500  HGB  --   --    < > 9.2* 9.5* 10.4* 10.1* 9.9*  MCV  --   --    < > 103.7* 101.0* 101.5* 102.8* 103.8*  VITAMINB12 298  --   --   --   --   --   --   --   FOLATE >40.0  --   --   --   --   --   --   --   FERRITIN  --  19  --   --   --   --   --   --   TIBC  --  276  --   --   --   --   --   --   IRON  --  14*  --   --   --   --   --   --   RETICCTPCT  --  4.6*  --   --   --   --   --   --    < > = values in this interval not displayed.    Impaired hearing Patient complains of compaction of both ears with wax.  She states when she goes for evaluation of her hearing aid, the technician cleans her for ears.  She is asking for wax removal while in the hospital. 2/27, started on hydrogen peroxide eardrops.   28, I tried removing the wax with Qtips. I didn't feel any resistance. Only few dead skin and debris came out.   Mobility: PT eval obtained.  CIR recommended.  Goals of care   Code Status: Full Code     DVT prophylaxis:  apixaban (ELIQUIS) tablet 2.5 mg Start: 02/02/23 1400 Place TED hose Start: 02/02/23 1353 SCD's Start: 01/11/23 0410 apixaban (ELIQUIS) tablet 2.5 mg   Antimicrobials: IV Zosyn Fluid: None Consultants: Vascular surgery, cardiology Family Communication: Husband at bedside  Status is: Inpatient Level of care: Progressive   Dispo: Patient is from: Home              Anticipated d/c is to: Pending clinical course, probably CIR Continue in-hospital care because: Not any plan for wound VAC change at bedside on 3/1.   Scheduled Meds:  acetaminophen  650 mg Oral Q6H   amiodarone  200 mg Oral BID   apixaban  2.5 mg Oral BID   carbamide peroxide  5 drop Both EARS  BID   Chlorhexidine Gluconate Cloth  6 each Topical Daily   clopidogrel  75 mg Oral Daily   feeding supplement  237 mL Oral TID BM   fentaNYL (SUBLIMAZE) injection  50 mcg Intravenous Once   insulin aspart  0-15 Units Subcutaneous TID WC   lidocaine  1 patch Transdermal Q24H   lidocaine  1 patch Transdermal Q24H   nutrition supplement (JUVEN)  1 packet Oral BID BM   rosuvastatin  10 mg Oral QHS   sodium chloride flush  10-40 mL Intracatheter Q12H   sodium chloride flush  10-40 mL Intracatheter Q12H  sodium chloride flush  3 mL Intravenous Q12H   sodium chloride flush  3 mL Intravenous Q12H   spironolactone  12.5 mg Oral Daily    PRN meds: sodium chloride, acetaminophen, alum & mag hydroxide-simeth, hydrocortisone cream, ipratropium-albuterol, melatonin, nitroGLYCERIN, ondansetron (ZOFRAN) IV, mouth rinse, oxyCODONE, simethicone, sodium chloride flush, sodium chloride flush, traMADol   Infusions:   sodium chloride 250 mL (01/23/23 1235)   piperacillin-tazobactam (ZOSYN)  IV 3.375 g (02/08/23 0525)    Diet:  Diet Order             Diet heart healthy/carb modified Room service appropriate? Yes with Assist; Fluid consistency: Thin  Diet effective now                   Antimicrobials: Anti-infectives (From admission, onward)    Start     Dose/Rate Route Frequency Ordered Stop   02/01/23 1700  vancomycin (VANCOCIN) IVPB 1000 mg/200 mL premix  Status:  Discontinued        1,000 mg 200 mL/hr over 60 Minutes Intravenous Every 48 hours 01/30/23 1548 01/31/23 1405   01/31/23 1400  cefTAZidime (FORTAZ) 2 g in sodium chloride 0.9 % 100 mL IVPB  Status:  Discontinued        2 g 200 mL/hr over 30 Minutes Intravenous Every 24 hours 01/30/23 1539 01/30/23 1548   01/31/23 1032  ceFAZolin 1 g / gentamicin 80 mg in NS 500 mL surgical irrigation  Status:  Discontinued          As needed 01/31/23 1033 01/31/23 1046   01/31/23 0600  piperacillin-tazobactam (ZOSYN) IVPB 3.375 g         3.375 g 12.5 mL/hr over 240 Minutes Intravenous Every 8 hours 01/30/23 1606     01/30/23 1800  piperacillin-tazobactam (ZOSYN) IVPB 3.375 g  Status:  Discontinued        3.375 g 12.5 mL/hr over 240 Minutes Intravenous Every 8 hours 01/30/23 1556 01/30/23 1606   01/30/23 1700  piperacillin-tazobactam (ZOSYN) IVPB 3.375 g  Status:  Discontinued        3.375 g 12.5 mL/hr over 240 Minutes Intravenous STAT 01/30/23 1606 01/30/23 1609   01/30/23 1700  cefTAZidime (FORTAZ) 2 g in sodium chloride 0.9 % 100 mL IVPB        2 g 200 mL/hr over 30 Minutes Intravenous  Once 01/30/23 1610 01/30/23 2131   01/30/23 1600  cefTAZidime (FORTAZ) 2 g in sodium chloride 0.9 % 100 mL IVPB  Status:  Discontinued        2 g 200 mL/hr over 30 Minutes Intravenous Every 24 hours 01/30/23 1548 01/30/23 1556   01/30/23 1400  cefTAZidime (FORTAZ) 2 g in sodium chloride 0.9 % 100 mL IVPB  Status:  Discontinued        2 g 200 mL/hr over 30 Minutes Intravenous Every 8 hours 01/30/23 1028 01/30/23 1539   01/30/23 1115  vancomycin (VANCOREADY) IVPB 1500 mg/300 mL  Status:  Discontinued        1,500 mg 150 mL/hr over 120 Minutes Intravenous  Once 01/30/23 1028 01/30/23 1556   01/29/23 2200  ceFAZolin (ANCEF) IVPB 2g/100 mL premix        2 g 200 mL/hr over 30 Minutes Intravenous 2 times daily 01/29/23 1335 01/29/23 2128   01/26/23 0000  vancomycin (VANCOCIN) IVPB 1000 mg/200 mL premix        1,000 mg 200 mL/hr over 60 Minutes Intravenous On call 01/25/23 0844 01/26/23 0944   01/24/23  2200  ceFAZolin (ANCEF) IVPB 2g/100 mL premix  Status:  Discontinued        2 g 200 mL/hr over 30 Minutes Intravenous 2 times daily 01/24/23 1023 01/29/23 1335   01/24/23 0900  ceFAZolin (ANCEF) IVPB 1 g/50 mL premix  Status:  Discontinued        1 g 100 mL/hr over 30 Minutes Intravenous 2 times daily 01/24/23 0823 01/24/23 1023   01/24/23 0430  ceFAZolin (ANCEF) IVPB 1 g/50 mL premix        1 g 100 mL/hr over 30 Minutes Intravenous Every 12  hours 01/23/23 1942 01/24/23 0524   01/23/23 0800  ceFAZolin (ANCEF) IVPB 2g/100 mL premix        2 g 200 mL/hr over 30 Minutes Intravenous On call 01/23/23 0011 01/23/23 1710   01/23/23 0100  gentamicin (GARAMYCIN) 80 mg in sodium chloride 0.9 % 500 mL irrigation        80 mg Irrigation On call 01/23/23 0011 01/23/23 1749   01/10/23 1958  ceFAZolin (ANCEF) IVPB 2 g/50 mL premix        over 30 Minutes  Continuous PRN 01/10/23 1958 01/10/23 2036       Skin assessment:       Nutritional status:  Body mass index is 27.5 kg/m.  Nutrition Problem: Moderate Malnutrition Etiology: acute illness Signs/Symptoms: mild fat depletion, mild muscle depletion, moderate muscle depletion     Objective: Vitals:   02/08/23 0803 02/08/23 1125  BP: 112/64 119/74  Pulse: 70   Resp:    Temp: 98.3 F (36.8 C) 98.4 F (36.9 C)  SpO2: 100% 100%    Intake/Output Summary (Last 24 hours) at 02/08/2023 1303 Last data filed at 02/08/2023 [id number] Gross per 24 hour  Intake 801.99 ml  Output 1000 ml  Net -198.01 ml    Filed Weights   02/06/23 1053 02/07/23 0500 02/08/23 0430  Weight: 66.9 kg 68.6 kg 68.2 kg   Weight change: 1.3 kg Body mass index is 27.5 kg/m.   Physical Exam: General exam: Pleasant elderly African-American female.  Not in pain  Skin: No rashes, lesions or ulcers.  Chronic ichthyosis HEENT: Atraumatic, normocephalic, no obvious bleeding. Some earwax and debris removed from both ears. Lungs: Clear to auscultation bilaterally CVS: Regular rate and rhythm, no murmur GI/Abd soft, nontender, nondistended, bowel sound present CNS: Alert, awake, oriented x 3.  Hard of hearing Psychiatry: Mood appropriate Extremities: Bilateral pedal edema seem slightly improved..  No calf tenderness.  Data Review: I have personally reviewed the laboratory data and studies available.  F/u labs ordered Unresulted Labs (From admission, onward)     Start     Ordered   02/03/23 0500  Cooxemetry  Panel (carboxy, met, total hgb, O2 sat)  Daily,   R     Question:  Specimen collection method  Answer:  Lab=Lab collect   02/02/23 1352   02/02/23 [id number]  Basic metabolic panel  Daily,   R     Question:  Specimen collection method  Answer:  Lab=Lab collect   02/01/23 1030   02/02/23 0500  CBC  Daily,   R     Question:  Specimen collection method  Answer:  Lab=Lab collect   02/01/23 1030   01/26/23 1112  Acid Fast Culture with reflexed sensitivities  RELEASE UPON ORDERING,   TIMED       Comments: Specimen A: Pre-op diagnosis: RIGHT GROIN WOUND    01/26/23 1112   01/26/23 0500  Magnesium  Daily,   R     Question:  Specimen collection method  Answer:  Lab=Lab collect   01/25/23 0754            Total time spent in review of labs and imaging, patient evaluation, formulation of plan, documentation and communication with family: 28 minutes  Signed, Terrilee Croak, MD [facility] 02/08/2023

## 2023-02-09 ENCOUNTER — Ambulatory Visit: Payer: Medicare Other | Admitting: Internal Medicine

## 2023-02-09 DIAGNOSIS — I214 Non-ST elevation (NSTEMI) myocardial infarction: Secondary | ICD-10-CM | POA: Diagnosis not present

## 2023-02-09 LAB — BASIC METABOLIC PANEL
Anion gap: 10 (ref 5–15)
BUN: 44 mg/dL — ABNORMAL HIGH (ref 8–23)
CO2: 32 mmol/L (ref 22–32)
Calcium: 9.7 mg/dL (ref 8.9–10.3)
Chloride: 100 mmol/L (ref 98–111)
Creatinine, Ser: 1.41 mg/dL — ABNORMAL HIGH (ref 0.44–1.00)
GFR, Estimated: 37 mL/min — ABNORMAL LOW (ref >60)
Glucose, Bld: 112 mg/dL — ABNORMAL HIGH (ref 70–99)
Potassium: 4.5 mmol/L (ref 3.5–5.1)
Sodium: 142 mmol/L (ref 135–145)

## 2023-02-09 LAB — CBC
HCT: 34.5 % — ABNORMAL LOW (ref 36.0–46.0)
Hemoglobin: 10.2 g/dL — ABNORMAL LOW (ref 12.0–15.0)
MCH: 30.7 pg (ref 26.0–34.0)
MCHC: 29.6 g/dL — ABNORMAL LOW (ref 30.0–36.0)
MCV: 103.9 fL — ABNORMAL HIGH (ref 80.0–100.0)
Platelets: 287 10*3/uL (ref 150–400)
RBC: 3.32 MIL/uL — ABNORMAL LOW (ref 3.87–5.11)
RDW: 20.3 % — ABNORMAL HIGH (ref 11.5–15.5)
WBC: 5.7 10*3/uL (ref 4.0–10.5)
nRBC: 0 % (ref 0.0–0.2)

## 2023-02-09 LAB — COOXEMETRY PANEL
Carboxyhemoglobin: 2 % — ABNORMAL HIGH (ref 0.5–1.5)
Methemoglobin: <0.7 % (ref 0.0–1.5)
O2 Saturation: 68.3 %
Total hemoglobin: 10.5 g/dL — ABNORMAL LOW (ref 12.0–16.0)

## 2023-02-09 LAB — GLUCOSE, CAPILLARY
Glucose-Capillary: 101 mg/dL — ABNORMAL HIGH (ref 70–99)
Glucose-Capillary: 156 mg/dL — ABNORMAL HIGH (ref 70–99)
Glucose-Capillary: 91 mg/dL (ref 70–99)
Glucose-Capillary: 133 mg/dL — ABNORMAL HIGH (ref 70–99)

## 2023-02-09 LAB — MAGNESIUM: Magnesium: 2.3 mg/dL (ref 1.7–2.4)

## 2023-02-09 MED ORDER — HYDROMORPHONE HCL 1 MG/ML IJ SOLN
0.2500 mg | Freq: Once | INTRAMUSCULAR | Status: AC
  Administered 2023-02-09: 0.25 mg via INTRAVENOUS

## 2023-02-09 MED ORDER — SPIRONOLACTONE 25 MG PO TABS: 25.0000 mg | ORAL_TABLET | Freq: Every day | ORAL | Status: DC

## 2023-02-09 MED ORDER — HYDROMORPHONE HCL 1 MG/ML IJ SOLN
0.5000 mg | Freq: Once | INTRAMUSCULAR | Status: DC
  Filled 2023-02-09: qty 0.5

## 2023-02-09 MED ORDER — MELATONIN 5 MG PO TABS
5.0000 mg | ORAL_TABLET | Freq: Every evening | ORAL | Status: DC | PRN
  Administered 2023-02-09 – 2023-02-18 (×10): 5 mg via ORAL
  Filled 2023-02-09 (×10): qty 1

## 2023-02-09 MED ORDER — SPIRONOLACTONE 12.5 MG HALF TABLET
12.5000 mg | ORAL_TABLET | Freq: Every day | ORAL | Status: DC
  Administered 2023-02-10 – 2023-02-19 (×10): 12.5 mg via ORAL
  Filled 2023-02-09 (×10): qty 1

## 2023-02-09 MED ORDER — HYDROMORPHONE HCL 1 MG/ML IJ SOLN: 1.0000 mg | Freq: Once | INTRAMUSCULAR | Status: DC

## 2023-02-09 MED ORDER — TORSEMIDE 20 MG PO TABS
20.0000 mg | ORAL_TABLET | Freq: Every day | ORAL | Status: DC
  Administered 2023-02-09 – 2023-02-19 (×11): 20 mg via ORAL
  Filled 2023-02-09 (×11): qty 1

## 2023-02-09 NOTE — Progress Notes (Addendum)
Patient ID: Natasha Woodard, female   DOB: Feb 08, 1941, 82 y.o.   MRN: [id number]     Advanced Heart Failure Rounding Note  PCP-Cardiologist: Dr. Ellyn Hack AHF: Dr. 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 Now admitted for acute lateral wall MI 2/2 distal LCx occlusion c/b severe ischemic MR and acute systolic heart failure w/ low output, CI 2.1 on RHC. Required placement of IABP, but later removed given concerns for developing limb ischemia=>found to have Right femoral PSA then ultimate rupture s/p emergent repair. VT arrest 2/8. ICD placed 2/12.  Subjective:   1/31 Underwent thrombin injection to right femoral PSA . Hgb drifting down 10.3>8.3>6.8  2/1 Started on amio drip to suppress PVCs. Got dose of IV lasix x1. CVP low.  2/4 Ruptured PSA. Returned to OR for repair. VAC in place 2/5 Midodrine 5 mg three times daily added.  Back in A fib.  2/7 TEE with mod MR.  2/8 VT arrest--> shock x1 ROSC after 2 minutes. Given lidocaine and amio was increased to 60 mg.  2/9 Transvenous pacemaker placed for overdrive pacing with PMVT.  2/11 Milrinone restarted, Co-ox 49% 2/12 PICC removed. S/p [employer] ICD  2/14 Milrinone stopped . Amio drip stopped.  2/15 Returned to OR for washout and VAC change with sartorious muscle flap.  2/20 OR for washout and wound vac placement.   2/26 OR groin washout, debridement and vac change   Milrinone stopped 2/28 Co-ox 72%.    Diuretics remain on hold. Wt stable. Scr stable 1.55.   Feels well today. Denies CP. No dyspnea. Groin site stable. Main complaint is the food. Asking if she can liberalize diet and have her family bring in 1 desired meal.     Objective:   Weight Range: 68 kg Body mass index is 27.42 kg/m.   Vital Signs:   Temp:  [97.9 F (36.6 C)-98.7 F (37.1 C)] 98.2 F (36.8 C) (02/29 0801) Pulse Rate:  [69-80] 69 (02/29 0801) Resp:  [16-20] 20 (02/29  0328) BP: (119-128)/(59-77) 127/66 (02/29 0801) SpO2:  [99 %-100 %] 99 % (02/29 0801) Weight:  [68 kg] 68 kg (02/29 0500) Last BM Date : 02/08/23  Weight change: Filed Weights   02/07/23 0500 02/08/23 0430 02/09/23 0500  Weight: 68.6 kg 68.2 kg 68 kg    Intake/Output:   Intake/Output Summary (Last 24 hours) at 02/09/2023 1002 Last data filed at 02/08/2023 2136 Gross per 24 hour  Intake 265.22 ml  Output 375 ml  Net -109.78 ml    PHYSICAL EXAM: General:  Well appearing. No respiratory difficulty HEENT: normal Neck: supple. JVD 7 cm. Carotids 2+ bilat; no bruits. No lymphadenopathy or thyromegaly appreciated. Cor: PMI nondisplaced. Regular rate & rhythm. No rubs, gallops or murmurs. Lungs: clear Abdomen: soft, nontender, nondistended. No hepatosplenomegaly. No bruits or masses. Good bowel sounds. Extremities: no cyanosis, clubbing, rash, edema + RUE PICC + left groin wound vac  Neuro: alert & oriented x 3, cranial nerves grossly intact. moves all 4 extremities w/o difficulty. Affect pleasant.   Telemetry   A Paced 70s with occasional PVCs.   Labs    CBC Recent Labs    02/08/23 0500 02/09/23 0340  WBC 5.6 5.7  HGB 9.9* 10.2*  HCT 33.0* 34.5*  MCV 103.8* 103.9*  PLT 279 [id number]   Basic Metabolic Panel Recent Labs    02/08/23 0500 02/09/23 0340  NA 141  142  K 4.4 4.5  CL 97* 100  CO2 33* 32  GLUCOSE 107* 112*  BUN 48* 44*  CREATININE 1.55* 1.41*  CALCIUM 9.5 9.7  MG 2.1 2.3   BNP: BNP (last 3 results) Recent Labs    08/31/22 1613 01/10/23 1048 02/01/23 1239  BNP 1,178.5* >4,500.0* 914.7*   Imaging   No results found.  Medications:     Scheduled Medications:  acetaminophen  650 mg Oral Q6H   amiodarone  200 mg Oral BID   apixaban  2.5 mg Oral BID   carbamide peroxide  5 drop Both EARS BID   Chlorhexidine Gluconate Cloth  6 each Topical Daily   clopidogrel  75 mg Oral Daily   feeding supplement  237 mL Oral TID BM   fentaNYL (SUBLIMAZE)  injection  50 mcg Intravenous Once   [START ON 02/10/2023]  HYDROmorphone (DILAUDID) injection  1 mg Intravenous Once   insulin aspart  0-15 Units Subcutaneous TID WC   lidocaine  1 patch Transdermal Q24H   lidocaine  1 patch Transdermal Q24H   multivitamin with minerals  1 tablet Oral Daily   nutrition supplement (JUVEN)  1 packet Oral BID BM   rosuvastatin  10 mg Oral QHS   sodium chloride flush  10-40 mL Intracatheter Q12H   sodium chloride flush  10-40 mL Intracatheter Q12H   sodium chloride flush  3 mL Intravenous Q12H   sodium chloride flush  3 mL Intravenous Q12H   spironolactone  12.5 mg Oral Daily    Infusions:  sodium chloride 250 mL (01/23/23 1235)   piperacillin-tazobactam (ZOSYN)  IV 3.375 g (02/09/23 0532)   PRN Medications: sodium chloride, acetaminophen, alum & mag hydroxide-simeth, hydrocortisone cream, ipratropium-albuterol, melatonin, nitroGLYCERIN, ondansetron (ZOFRAN) IV, mouth rinse, oxyCODONE, simethicone, sodium chloride flush, sodium chloride flush, traMADol  Assessment/Plan  1. Cardiogenic shock/acute on chronic systolic CHF: Echo this admission with EF 20-25%, mild LV dilation, mild RV dysfunction, severe MR with restricted posterior leaflet.  Echo in 2/23 with EF 50-55%.  Patient has history of nonischemic cardiomyopathy (?PVC-mediated) that improved.  She reports 2 months of dyspnea then had lateral STEMI 1/30 with chest pain.  PLOM occlusion not thought to explain the extent of her cardiomyopathy, so suspect EF may have been falling prior to this event (symptomatic at least 2 months).  Suspect mixed ischemic/nonischemic cardiomyopathy. She initially had IABP for cardiogenic shock, now removed due to right leg ischemia. Was weaned off pressors/inotropes but Milrinone restarted 2/23 with volume overload and poor diuresis.   - Volume status improved.  Euvolemic on exam. Renal fx stable and back to b/l  - Milrinone stopped 2/28. Co-ox stable at 68%. CVP 7 - increase  spironolactone 12.5>25 daily.   - Hold off on SGLT2i given groin infection.  - additional GDMT initially limited by hypotension and AKI, now off midodrine.  - S/p  DDD ICD.  - Strict I&O, daily weights 2. Mitral regurgitation: severe MR with restricted posterior leaflet on initial echo, suspect infarct-related MR with PLOM occlusion. TEE was later done showing moderate MR.  3. CAD: Lateral STEMI with occluded pLOM, treated with DES.   - Off ASA given apixaban use.  - Switched from Brilinta to plavix.  - Continue statin.  4. 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  - Continue amio 200 mg twice a day.   5. Right femoral PSA: s/p thrombin injection by Dr. Carlis Abbott. 2/4 stat u/s with recurrent PSA with active bleeding  into PSA. S/p PSA repair with VAC placement. 2/15 VAC change with sartorious muscle flap.  Vascular following. Went to OR 2/26 for washout and wound vac placement.   - She is on Zosyn for wound infection.  6. AKI on CKD stage 3b: Baseline 1.3-1.5.  Creatinine trended up to 2.06. Milrinone restarted for low output. SCr 1.41 today  7.  Acute blood loss anemia:  Hgb stable.  8. Atrial fibrillation: Paroxysmal. Maintaining SR.  - Amiodarone 200 mg bid  - Continue apixaban 2.5 bid.  9. VT Arrest: 2/7, DCCV x 1 with CPR, ROSC after 2 minutes.  Polymorphic VT, appeared bradycardia-mediated with intermittent junctional rhythm. - s/p DDD ICD on 2/12 - Continue amiodarone.  Disposition: PT evaluated yesterday, plan for CIR at discharge. CIR team to meet with family today.   Earnie Larsson AGACNP-BC  02/09/2023  Patient seen and examined with the above-signed Advanced Practice Provider and/or Housestaff. I personally reviewed laboratory data, imaging studies and relevant notes. I independently examined the patient and formulated the important aspects of the plan. I have edited the note to reflect any of my changes or salient points. I have personally discussed the  plan with the patient and/or family.  Off milrinone. Co-ox and CVP stable. Denies SOB, orthopnea or PND  General:  Sitting in chair. No resp difficulty HEENT: normal Neck: supple. no JVD. Carotids 2+ bilat; no bruits. No lymphadenopathy or thryomegaly appreciated. Cor: PMI nondisplaced. Regular rate & rhythm. 2/6 MR Lungs: clear Abdomen: soft, nontender, nondistended. No hepatosplenomegaly. No bruits or masses. Good bowel sounds. Extremities: no cyanosis, clubbing, rash, edema R groin wound vac Neuro: alert & orientedx3, cranial nerves grossly intact. moves all 4 extremities w/o difficulty. Affect pleasant  She is doing very well from a HF perspective. Co-ox stable off milrinone.Remains in NSR. Ok for d./c from our standpoint. Feel she would benefit greatly from CIR.   Glori Bickers, MD  11:08 PM

## 2023-02-09 NOTE — Progress Notes (Addendum)
PROGRESS NOTE  [name]  DOB: [birth year]  PCP: Tonia Ghent, MD [id number]  DOA: 01/10/2023  LOS: 105 days  Hospital Day: 31  Brief narrative: Natasha Woodard is a 82 y.o. female with PMH significant for DM2, HTN, HLD, nonischemic cardiomyopathy secondary to PVCs suppressed on low-dose amiodarone with ultimate recovery of EF. 1/30, patient presented to the ED with progressively worsening chest pain, worsening shortness of breath.  Troponin went up to over 2000.  She was given IV Lasix, stat echo showed EF of 20 to 25% with wall motion abnormalities.  She underwent urgent cardiac catheterization.  She was found to have acute lateral wall MI secondary to distal LCx occlusion complicated by severe ischemic MR and acute systolic heart failure with low output (EF 20 to 25%).  She required placement of IABP which was later removed given concern of developing limb ischemia.  1/31, she was found to have right femoral pseudoaneurysm was ultimately ruptured for which she required emergent repair on 2/4. Postcardiac cath, she was started on amiodarone drip to suppress PVCs. 2/8, she had VT arrest, ROSC achieved after a shock.  She was started on milrinone drip. 2/12, underwent ICD placement. 2/14, milrinone and amiodarone stopped. 2/15, returned to OR for washout and VAC change with sartorious muscle flap.  2/17: Transferred from heart failure team to [facility] service 2/20: Back to the OR for right groin washout, debridement, wound VAC replacement 2/22: Milrinone drip started again.  Subjective: Patient was seen and examined this morning Lying on bed.  Not in distress.  No new symptoms.  son at bedside. No new symptoms.  Assessment and plan: Acute lateral wall MI  Presented with progressively worsening shortness of breath noted distal occlusion of the circumflex marginal vessel complicated by severe ischemic MR and acute systolic CHF treated with DES.   Continue Eliquis, plavix and  statin.  Cardiogenic shock Severe ischemic MR and acute systolic CHF Echo [id number] w/ EF 20-25%, mildly reduced RV function, severe MR During cardiac cath, patient developed cardiogenic shock, initially required IABP which was later removed given concern of developing limb ischemia. TEE 2/7 confirmed severe MR ICD placement 2/12 HF team following. She was diuresed with IV Lasix and milrinone drip. Currently on torsemide and Aldactone.  Paroxysmal atrial fibrillation VT arrest 2/7 S/p DCCV x 1 with CPR, ROSC after 2 minutes.  Polymorphic VT, appeared bradycardia-mediated with intermittent junctional rhythm. s/p DDD ICD on 2/12 Currently on amiodarone 200 mg twice daily. 2/21, cardiovascular surgery okay with reinitiation of Eliquis.   Right femoral pseudoaneurysm Right groin wound, secondary infection Patient developed pseudoaneurysm following balloon pump, s/p thrombin injection by Dr. Carlis Abbott 1/31.  2/4 stat u/s showed recurrent PSA with active bleeding into PSA. S/p PSA repair with VAC placement.  2/15 VAC change with sartorious muscle flap.  2/20, went to the OR for washout and wound VAC placement. 2/27, underwent wound VAC change in OR.  Tentative plan of wound VAC change at bedside on 3/1. Wound culture showed polymicrobial growth showing moderate Morganella and Enterococcus with few  pseudomonas.   Continue IV Zosyn per vascular surgery   AKI on CKD stage 3b Baseline creatinine 1.3-1.5.  Creatinine peaked at 2.06 on 2/13.  Improving as below. Recent Labs    01/31/23 0730 02/01/23 0805 02/02/23 0715 02/03/23 [id number] 02/04/23 0051 02/05/23 0621 02/06/23 0449 02/07/23 0338 02/08/23 0500 02/09/23 0340  BUN 32* 37* 37* 36* 38* 38* 37* 37* 48* 44*  CREATININE 1.34* 1.62* 1.74* 1.68*  1.69* 1.49* 1.50* 1.62* 1.55* 1.41*     Chronic anemia Severe iron deficiency Secondary to blood loss and iron deficiency given IV iron 2/18 Hemoglobin trend as below.  Stable stable.  Improved  number probably because of hemoconcentration after significant diuresis Recent Labs    01/13/23 0857 01/13/23 0858 01/13/23 1959 02/05/23 0621 02/06/23 0449 02/07/23 0338 02/08/23 0500 02/09/23 0340  HGB  --   --    < > 9.5* 10.4* 10.1* 9.9* 10.2*  MCV  --   --    < > 101.0* 101.5* 102.8* 103.8* 103.9*  VITAMINB12 298  --   --   --   --   --   --   --   FOLATE >40.0  --   --   --   --   --   --   --   FERRITIN  --  19  --   --   --   --   --   --   TIBC  --  276  --   --   --   --   --   --   IRON  --  14*  --   --   --   --   --   --   RETICCTPCT  --  4.6*  --   --   --   --   --   --    < > = values in this interval not displayed.    Impaired hearing Patient complains of compaction of both ears with wax.  She states when she goes for evaluation of her hearing aid, the technician cleans her for ears.  She is asking for wax removal while in the hospital. 2/27, started on hydrogen peroxide eardrops.   28, I tried removing the wax with Qtips. I didn't feel any resistance. Only few dead skin and debris came out.   Mobility: PT eval obtained.  CIR recommended.  Goals of care   Code Status: Full Code     DVT prophylaxis:  apixaban (ELIQUIS) tablet 2.5 mg Start: 02/02/23 1400 Place TED hose Start: 02/02/23 1353 SCD's Start: 01/11/23 0410 apixaban (ELIQUIS) tablet 2.5 mg   Antimicrobials: IV Zosyn Fluid: None Consultants: Vascular surgery, cardiology Family Communication: Husband at bedside  Status is: Inpatient Level of care: Progressive   Dispo: Patient is from: Home              Anticipated d/c is to: Pending clinical course, probably CIR Continue in-hospital care because: Noted the plan for wound VAC change at bedside on 3/1.  Probably rehab next week.   Scheduled Meds:  acetaminophen  650 mg Oral Q6H   amiodarone  200 mg Oral BID   apixaban  2.5 mg Oral BID   carbamide peroxide  5 drop Both EARS BID   Chlorhexidine Gluconate Cloth  6 each Topical Daily    clopidogrel  75 mg Oral Daily   feeding supplement  237 mL Oral TID BM   fentaNYL (SUBLIMAZE) injection  50 mcg Intravenous Once   [START ON 02/10/2023]  HYDROmorphone (DILAUDID) injection  1 mg Intravenous Once   insulin aspart  0-15 Units Subcutaneous TID WC   lidocaine  1 patch Transdermal Q24H   lidocaine  1 patch Transdermal Q24H   multivitamin with minerals  1 tablet Oral Daily   nutrition supplement (JUVEN)  1 packet Oral BID BM   rosuvastatin  10 mg Oral QHS   sodium chloride flush  3  mL Intravenous Q12H   [START ON 02/10/2023] spironolactone  12.5 mg Oral Daily   torsemide  20 mg Oral Daily    PRN meds: sodium chloride, acetaminophen, alum & mag hydroxide-simeth, hydrocortisone cream, ipratropium-albuterol, melatonin, nitroGLYCERIN, ondansetron (ZOFRAN) IV, mouth rinse, oxyCODONE, simethicone, sodium chloride flush, traMADol   Infusions:   sodium chloride 250 mL (01/23/23 1235)   piperacillin-tazobactam (ZOSYN)  IV 3.375 g (02/09/23 0532)    Diet:  Diet Order             Diet NPO time specified  Diet effective midnight           Diet Heart Room service appropriate? Yes; Fluid consistency: Thin  Diet effective now                   Antimicrobials: Anti-infectives (From admission, onward)    Start     Dose/Rate Route Frequency Ordered Stop   02/01/23 1700  vancomycin (VANCOCIN) IVPB 1000 mg/200 mL premix  Status:  Discontinued        1,000 mg 200 mL/hr over 60 Minutes Intravenous Every 48 hours 01/30/23 1548 01/31/23 1405   01/31/23 1400  cefTAZidime (FORTAZ) 2 g in sodium chloride 0.9 % 100 mL IVPB  Status:  Discontinued        2 g 200 mL/hr over 30 Minutes Intravenous Every 24 hours 01/30/23 1539 01/30/23 1548   01/31/23 1032  ceFAZolin 1 g / gentamicin 80 mg in NS 500 mL surgical irrigation  Status:  Discontinued          As needed 01/31/23 1033 01/31/23 1046   01/31/23 0600  piperacillin-tazobactam (ZOSYN) IVPB 3.375 g        3.375 g 12.5 mL/hr over 240  Minutes Intravenous Every 8 hours 01/30/23 1606     01/30/23 1800  piperacillin-tazobactam (ZOSYN) IVPB 3.375 g  Status:  Discontinued        3.375 g 12.5 mL/hr over 240 Minutes Intravenous Every 8 hours 01/30/23 1556 01/30/23 1606   01/30/23 1700  piperacillin-tazobactam (ZOSYN) IVPB 3.375 g  Status:  Discontinued        3.375 g 12.5 mL/hr over 240 Minutes Intravenous STAT 01/30/23 1606 01/30/23 1609   01/30/23 1700  cefTAZidime (FORTAZ) 2 g in sodium chloride 0.9 % 100 mL IVPB        2 g 200 mL/hr over 30 Minutes Intravenous  Once 01/30/23 1610 01/30/23 2131   01/30/23 1600  cefTAZidime (FORTAZ) 2 g in sodium chloride 0.9 % 100 mL IVPB  Status:  Discontinued        2 g 200 mL/hr over 30 Minutes Intravenous Every 24 hours 01/30/23 1548 01/30/23 1556   01/30/23 1400  cefTAZidime (FORTAZ) 2 g in sodium chloride 0.9 % 100 mL IVPB  Status:  Discontinued        2 g 200 mL/hr over 30 Minutes Intravenous Every 8 hours 01/30/23 1028 01/30/23 1539   01/30/23 1115  vancomycin (VANCOREADY) IVPB 1500 mg/300 mL  Status:  Discontinued        1,500 mg 150 mL/hr over 120 Minutes Intravenous  Once 01/30/23 1028 01/30/23 1556   01/29/23 2200  ceFAZolin (ANCEF) IVPB 2g/100 mL premix        2 g 200 mL/hr over 30 Minutes Intravenous 2 times daily 01/29/23 1335 01/29/23 2128   01/26/23 0000  vancomycin (VANCOCIN) IVPB 1000 mg/200 mL premix        1,000 mg 200 mL/hr over 60 Minutes Intravenous On call  01/25/23 0844 01/26/23 0944   01/24/23 2200  ceFAZolin (ANCEF) IVPB 2g/100 mL premix  Status:  Discontinued        2 g 200 mL/hr over 30 Minutes Intravenous 2 times daily 01/24/23 1023 01/29/23 1335   01/24/23 0900  ceFAZolin (ANCEF) IVPB 1 g/50 mL premix  Status:  Discontinued        1 g 100 mL/hr over 30 Minutes Intravenous 2 times daily 01/24/23 0823 01/24/23 1023   01/24/23 0430  ceFAZolin (ANCEF) IVPB 1 g/50 mL premix        1 g 100 mL/hr over 30 Minutes Intravenous Every 12 hours 01/23/23 1942 01/24/23  0524   01/23/23 0800  ceFAZolin (ANCEF) IVPB 2g/100 mL premix        2 g 200 mL/hr over 30 Minutes Intravenous On call 01/23/23 0011 01/23/23 1710   01/23/23 0100  gentamicin (GARAMYCIN) 80 mg in sodium chloride 0.9 % 500 mL irrigation        80 mg Irrigation On call 01/23/23 0011 01/23/23 1749   01/10/23 1958  ceFAZolin (ANCEF) IVPB 2 g/50 mL premix        over 30 Minutes  Continuous PRN 01/10/23 1958 01/10/23 2036       Skin assessment:       Nutritional status:  Body mass index is 27.42 kg/m.  Nutrition Problem: Moderate Malnutrition Etiology: acute illness Signs/Symptoms: mild fat depletion, mild muscle depletion, moderate muscle depletion     Objective: Vitals:   02/09/23 0801 02/09/23 1100  BP: 127/66 (!) 119/55  Pulse: 69 70  Resp:  15  Temp: 98.2 F (36.8 C) (!) 97 F (36.1 C)  SpO2: 99% 100%    Intake/Output Summary (Last 24 hours) at 02/09/2023 1237 Last data filed at 02/08/2023 2136 Gross per 24 hour  Intake 265.22 ml  Output 375 ml  Net -109.78 ml    Filed Weights   02/07/23 0500 02/08/23 0430 02/09/23 0500  Weight: 68.6 kg 68.2 kg 68 kg   Weight change: -0.2 kg Body mass index is 27.42 kg/m.   Physical Exam: General exam: Pleasant elderly African-American female.  Not in pain  Skin: No rashes, lesions or ulcers.  Chronic ichthyosis HEENT: Atraumatic, normocephalic, no obvious bleeding.  Lungs: Clear to auscultation bilaterally CVS: Regular rate and rhythm, no murmur GI/Abd soft, nontender, nondistended, bowel sound present CNS: Alert, awake, oriented x 3.  Hard of hearing Psychiatry: Mood appropriate Extremities: Bilateral pedal edema seem improved..  No calf tenderness.  Data Review: I have personally reviewed the laboratory data and studies available.  F/u labs ordered Unresulted Labs (From admission, onward)     Start     Ordered   02/03/23 0500  Cooxemetry Panel (carboxy, met, total hgb, O2 sat)  Daily,   R     Question:   Specimen collection method  Answer:  Lab=Lab collect   02/02/23 1352   02/02/23 [id number]  Basic metabolic panel  Daily,   R     Question:  Specimen collection method  Answer:  Lab=Lab collect   02/01/23 1030   02/02/23 0500  CBC  Daily,   R     Question:  Specimen collection method  Answer:  Lab=Lab collect   02/01/23 1030   01/26/23 1112  Acid Fast Culture with reflexed sensitivities  RELEASE UPON ORDERING,   TIMED       Comments: Specimen A: Pre-op diagnosis: RIGHT GROIN WOUND    01/26/23 1112   01/26/23 0500  Magnesium  Daily,   R     Question:  Specimen collection method  Answer:  Lab=Lab collect   01/25/23 0754            Total time spent in review of labs and imaging, patient evaluation, formulation of plan, documentation and communication with family: 25 minutes  Signed, Terrilee Croak, MD [facility] 02/09/2023

## 2023-02-09 NOTE — Progress Notes (Addendum)
  Progress Note    02/09/2023 7:38 AM 3 Days Post-Op  Subjective:  no complaints   Vitals:   02/09/23 0328 02/09/23 0500  BP: 128/77   Pulse: 71 70  Resp: 20   Temp: 98.1 F (36.7 C)   SpO2: 100% 100%   Physical Exam: Lungs:  non labored Incisions:  R groin with vac in place Extremities:  feet warm with motor and sensation intact Neurologic: A&O  CBC    Component Value Date/Time   WBC 5.7 02/09/2023 0340   RBC 3.32 (L) 02/09/2023 0340   HGB 10.2 (L) 02/09/2023 0340   HGB 14.2 10/09/2020 0935   HCT 34.5 (L) 02/09/2023 0340   HCT 44.9 10/09/2020 0935   PLT 287 02/09/2023 0340   PLT 379 10/09/2020 0935   MCV 103.9 (H) 02/09/2023 0340   MCV 91 10/09/2020 0935   MCH 30.7 02/09/2023 0340   MCHC 29.6 (L) 02/09/2023 0340   RDW 20.3 (H) 02/09/2023 0340   RDW 13.4 10/09/2020 0935   LYMPHSABS 0.9 01/15/2023 0950   LYMPHSABS 1.1 10/09/2020 0935   MONOABS 1.4 (H) 01/15/2023 0950   EOSABS 0.6 (H) 01/15/2023 0950   EOSABS 0.2 10/09/2020 0935   BASOSABS 0.1 01/15/2023 0950   BASOSABS 0.1 10/09/2020 0935    BMET    Component Value Date/Time   NA 142 02/09/2023 0340   NA 145 (H) 07/07/2022 1007   K 4.5 02/09/2023 0340   CL 100 02/09/2023 0340   CO2 32 02/09/2023 0340   GLUCOSE 112 (H) 02/09/2023 0340   BUN 44 (H) 02/09/2023 0340   BUN 24 07/07/2022 1007   CREATININE 1.41 (H) 02/09/2023 0340   CREATININE 1.28 (H) 01/14/2022 1551   CALCIUM 9.7 02/09/2023 0340   GFRNONAA 37 (L) 02/09/2023 0340   GFRAA 49 (L) 10/09/2020 0933    INR    Component Value Date/Time   INR 1.4 (H) 01/10/2023 2145     Intake/Output Summary (Last 24 hours) at 02/09/2023 [id number] Last data filed at 02/08/2023 2136 Gross per 24 hour  Intake 265.22 ml  Output 375 ml  Net -109.78 ml     Assessment/Plan:  82 y.o. female with R groin wound 3 Days Post-Op   Plan is for wound vac change to the R groin at the bedside tomorrow.  Order placed for wound vac supplies.  Orders placed to  pre-medicate patient at 0700 tomorrow morning.  She will also be kept npo in case she is unable to tolerate bedside vac change.  All questions answered and patient and her son are in agreement with the above plans.   Dagoberto Ligas, PA-C [facility] [phone] 02/09/2023 7:38 AM   VASCULAR STAFF ADDENDUM: I have independently interviewed and examined the patient. I agree with the above.    Cassandria Santee, MD [facility] Phone Number: [phone] 02/09/2023 3:37 PM

## 2023-02-09 NOTE — Progress Notes (Signed)
Physical Therapy Treatment Patient Details Name: Natasha Woodard MRN: [id number] DOB: 12-Oct-1941 Today's Date: 02/09/2023   History of Present Illness 82 yo female admitted 1/30 with chest pain, NSTEMI s/p cardiac cath and IABP placed. Pt with bleeding and impaired circulation with IABP removed 1/31 and thrombin injection to pseudoaneurysm. 2/4 Rt groin pseudoaneurysm rupture s/p repair with placement of wound VAC. Pt with VT arrest on 2/8. ICD placed 2/12. Pt with rt groin washout and debridement and sartorius muscle flap with VAC placement on 2/15. PMhx: CAD, dilated cardiomyopathy, CKD, GERD, HFpEF, HTN, HLD, T2DM.    PT Comments    The pt was able to demo good progress with ambulation distance and transfers this session, continues to make good, steady progress towards PT goals. The pt continues to require increased time and effort with bed mobility, as well as minA from [facility] elevated but would need more assist from flat bed. The pt was able to complete sit-stand transfers with minA but does demo mild instability with initial stand. She was limited to ~25 ft ambulation prior to needing rest break, but had no overt LOB with gait training at this time. Continue to recommend rehab following d/c to maximize pt independence with mobility prior to return home.    Recommendations for follow up therapy are one component of a multi-disciplinary discharge planning process, led by the attending physician.  Recommendations may be updated based on patient status, additional functional criteria and insurance authorization.  Follow Up Recommendations  Acute inpatient rehab (3hours/day)     Assistance Recommended at Discharge Frequent or constant Supervision/Assistance  Patient can return home with the following A little help with walking and/or transfers;A little help with bathing/dressing/bathroom;Help with stairs or ramp for entrance;Assist for transportation;Assistance with cooking/housework   Equipment  Recommendations  Rolling walker (2 wheels)    Recommendations for Other Services       Precautions / Restrictions Precautions Precautions: Fall;ICD/Pacemaker;Other (comment) Precaution Comments: wound VAC rt groin Restrictions Weight Bearing Restrictions: No     Mobility  Bed Mobility Overal bed mobility: Needs Assistance Bed Mobility: Rolling, Sidelying to Sit, Sit to Supine Rolling: Min guard Sidelying to sit: Min guard, HOB elevated   Sit to supine: Mod assist   General bed mobility comments: minA with increased time and cues to complete transition to sitting EOB, modA to return to supine with assist at trunk and LE    Transfers Overall transfer level: Needs assistance Equipment used: Rolling walker (2 wheels) Transfers: Sit to/from Stand Sit to Stand: Min assist           General transfer comment: minA to power up, mild posterior lean with initial stand    Ambulation/Gait Ambulation/Gait assistance: Min assist, Mod assist Gait Distance (Feet): 25 Feet (+ 20 ft) Assistive device: Rolling walker (2 wheels) Gait Pattern/deviations: Step-through pattern, Trunk flexed, Drifts right/left, Decreased stride length Gait velocity: decreased Gait velocity interpretation: <1.31 ft/sec, indicative of household ambulator   General Gait Details: minA initially for stability, modA at times for safety with fatigue. standing rest break after 25 ft     Balance Overall balance assessment: Needs assistance Sitting-balance support: Feet supported Sitting balance-Leahy Scale: Fair Sitting balance - Comments: no UE support with static sitting   Standing balance support: Bilateral upper extremity supported, Reliant on assistive device for balance Standing balance-Leahy Scale: Poor Standing balance comment: dependence on BUE support  Cognition Arousal/Alertness: Awake/alert Behavior During Therapy: WFL for tasks assessed/performed Overall  Cognitive Status: Within Functional Limits for tasks assessed                                          Exercises Other Exercises Other Exercises: standing marches x 10    General Comments General comments (skin integrity, edema, etc.): VSS on RA      Pertinent Vitals/Pain Pain Assessment Pain Assessment: Faces Faces Pain Scale: Hurts a little bit Pain Location: R groin, generalized Pain Descriptors / Indicators: Sore Pain Intervention(s): Limited activity within patient's tolerance, Monitored during session, Repositioned     PT Goals (current goals can now be found in the care plan section) Acute Rehab PT Goals Patient Stated Goal: to get stronger at therapy prior to home PT Goal Formulation: With patient Time For Goal Achievement: 02/23/23 Potential to Achieve Goals: Good Progress towards PT goals: Progressing toward goals;Goals met and updated - see care plan    Frequency    Min 3X/week      PT Plan Current plan remains appropriate       AM-PAC PT "6 Clicks" Mobility   Outcome Measure  Help needed turning from your back to your side while in a flat bed without using bedrails?: A Little Help needed moving from lying on your back to sitting on the side of a flat bed without using bedrails?: A Lot Help needed moving to and from a bed to a chair (including a wheelchair)?: A Little Help needed standing up from a chair using your arms (e.g., wheelchair or bedside chair)?: A Little Help needed to walk in hospital room?: A Lot Help needed climbing 3-5 steps with a railing? : A Lot 6 Click Score: 15    End of Session Equipment Utilized During Treatment: Gait belt Activity Tolerance: Patient tolerated treatment well Patient left: with call bell/phone within reach;in bed;with nursing/sitter in room (MD present for wound care) Nurse Communication: Mobility status PT Visit Diagnosis: Unsteadiness on feet (R26.81);Other abnormalities of gait and mobility  (R26.89);Muscle weakness (generalized) (M62.81)     Time: [id number] PT Time Calculation (min) (ACUTE ONLY): 30 min  Charges:  $Gait Training: 8-22 mins $Therapeutic Exercise: 8-22 mins                     West Carbo, PT, DPT   Acute Rehabilitation Department Office [phone] Secure Chat Communication Preferred   Sandra Cockayne 02/09/2023, 4:41 PM

## 2023-02-09 NOTE — Progress Notes (Signed)
Pt tolerated vac change at bedside by Dr. Unk Lightning  with 0.25 mg of dilaudid

## 2023-02-09 NOTE — Progress Notes (Signed)
1st wound vac change at bedside.    Pt given 0.'25mg'$  IV dilaudid and tolerated vac change well. Wound with good granulation tissue and vac replaced with good seal.  Okay for discharge to CIR from vascular standpoint.   Will ask WOC to start VAC changes on Monday's and Thursday's starting on 02/13/2023.  I will put in the consult today.       Leontine Locket, [facility] 02/09/2023 3:39 PM

## 2023-02-09 NOTE — Progress Notes (Signed)
PT Cancellation Note  Patient Details Name: Natasha Woodard MRN: [id number] DOB: 1941/04/14   Cancelled Treatment:    Reason Eval/Treat Not Completed: Other (comment). Pt family present and states the pt just fell asleep, asked PT to return later in the afternoon for treatment session. Will re-attempt later as time/schedule allow.   West Carbo, PT, DPT   Acute Rehabilitation Department Office [phone] Secure Chat Communication Preferred   Sandra Cockayne 02/09/2023, 12:38 PM

## 2023-02-09 NOTE — Progress Notes (Addendum)
  Inpatient Rehabilitation Admissions Coordinator   I met at bedside with patient's spouse and son. Patient was asleep. I discussed goals and expectations of a possible CIR admit. Spouse states that he has discussed with surgeon that he does not intend for his wife to discharge home until groin wound is healed. Average length of stay is 2 weeks at CIR with goal for patients to then progress to discharge directly home to care of family and home health care or outpatient care. Goals for complete wound healing of groin wound not likely to occur during that time. I do not feel she is a current CIR rehab candidate at this time. I have encouraged continued discussions with treating MDs of their options.I will continue to follow at a distance.  Danne Baxter, RN, MSN Rehab Admissions Coordinator [phone] 02/09/2023 2:12 PM

## 2023-02-09 NOTE — Progress Notes (Signed)
Occupational Therapy Treatment Patient Details Name: Natasha Woodard MRN: [id number] DOB: 04-May-1941 Today's Date: 02/09/2023   History of present illness 82 yo female admitted 1/30 with chest pain, NSTEMI s/p cardiac cath and IABP placed. Pt with bleeding and impaired circulation with IABP removed 1/31 and thrombin injection to pseudoaneurysm. 2/4 Rt groin pseudoaneurysm rupture s/p repair with placement of wound VAC. Pt with VT arrest on 2/8. ICD placed 2/12. Pt with rt groin washout and debridement and sartorius muscle flap with VAC placement on 2/15. PMhx: CAD, dilated cardiomyopathy, CKD, GERD, HFpEF, HTN, HLD, T2DM.   OT comments  Pt up in recliner upon therapy arrival. Pt requesting exercises that she can complete without assistance. BUE strength assessed. Pt presents with primarily weakness in her shoulder (3+/5) in all ranges. Pt education on HEP for BUE shoulders utilizing yellow resistive band. Verbal instructions and visual demonstration provided. VC provided during exercises for proper form and technique. Handout provided for reference. OT will continue to follow patient acutely.    Recommendations for follow up therapy are one component of a multi-disciplinary discharge planning process, led by the attending physician.  Recommendations may be updated based on patient status, additional functional criteria and insurance authorization.    Follow Up Recommendations  Acute inpatient rehab (3hours/day)     Assistance Recommended at Discharge Frequent or constant Supervision/Assistance  Patient can return home with the following  A lot of help with bathing/dressing/bathroom;Assistance with cooking/housework;Direct supervision/assist for medications management;Direct supervision/assist for financial management;Assist for transportation;Help with stairs or ramp for entrance;Two people to help with walking and/or transfers   Equipment Recommendations  Other (comment) (defer to next venue  of care)       Precautions / Restrictions Precautions Precautions: Fall;ICD/Pacemaker;Other (comment) Precaution Comments: wound VAC rt groin Restrictions Weight Bearing Restrictions: No       Mobility Bed Mobility Overal bed mobility:  (up in recliner upon therapy arrival)             ADL either performed or assessed with clinical judgement      Cognition Arousal/Alertness: Awake/alert Behavior During Therapy: WFL for tasks assessed/performed Overall Cognitive Status: Within Functional Limits for tasks assessed        Exercises General Exercises - Upper Extremity Shoulder Flexion: Strengthening, Both, 10 reps, Seated, Theraband Theraband Level (Shoulder Flexion): Level 1 (Yellow) Shoulder Extension: Strengthening, Both, 5 reps, Seated, Theraband Theraband Level (Shoulder Extension): Level 1 (Yellow) Shoulder Horizontal ABduction: Strengthening, Both, 10 reps, Seated, Theraband Theraband Level (Shoulder Horizontal Abduction): Level 1 (Yellow) Shoulder Horizontal ADduction: Strengthening, Both, 10 reps, Seated, Theraband Theraband Level (Shoulder Horizontal Adduction): Level 1 (Yellow) Shoulder Exercises Shoulder External Rotation: Strengthening, Both, 10 reps, Seated, Theraband Theraband Level (Shoulder External Rotation): Level 1 (Yellow)            Pertinent Vitals/ Pain       Pain Assessment Pain Assessment: Faces Faces Pain Scale: No hurt         Frequency  Min 2X/week        Progress Toward Goals  OT Goals(current goals can now be found in the care plan section)  Progress towards OT goals: Progressing toward goals     Plan Discharge plan remains appropriate;Frequency remains appropriate       AM-PAC OT "6 Clicks" Daily Activity     Outcome Measure   Help from another person eating meals?: None Help from another person taking care of personal grooming?: A Little Help from another person toileting, which includes using toliet,  bedpan,  or urinal?: A Lot Help from another person bathing (including washing, rinsing, drying)?: A Lot Help from another person to put on and taking off regular upper body clothing?: A Lot Help from another person to put on and taking off regular lower body clothing?: A Lot 6 Click Score: 15    End of Session    OT Visit Diagnosis: Other abnormalities of gait and mobility (R26.89);Muscle weakness (generalized) (M62.81);Pain Pain - Right/Left: Right Pain - part of body: Leg   Activity Tolerance Patient tolerated treatment well   Patient Left in chair;with call bell/phone within reach;with chair alarm set;with family/visitor present           Time: [id number] OT Time Calculation (min): 29 min  Charges: OT General Charges $OT Visit: 1 Visit OT Treatments $Therapeutic Exercise: 23-37 mins  Ailene Ravel, OTR/L,CBIS  Supplemental OT - [facility] and [facility] Secure Chat Preferred    Wiliam Cauthorn, Clarene Duke 02/09/2023, 10:58 AM

## 2023-02-09 NOTE — Patient Instructions (Signed)

## 2023-02-09 NOTE — Progress Notes (Addendum)
Verbal order from Dr. Unk Lightning for 0.25 Dilaudid once for wound vac change

## 2023-02-09 NOTE — Progress Notes (Addendum)
VAC changed at bedside. Healthy tissue underneath.  No plans for future OR trips.  Okay from vascular surgery perspective for inpt rehab.     Will ask for [facility] change assistance by [place] multidisciplinary care and nursing.   Natasha Woodard

## 2023-02-10 ENCOUNTER — Encounter (HOSPITAL_COMMUNITY): Payer: Self-pay

## 2023-02-10 DIAGNOSIS — I214 Non-ST elevation (NSTEMI) myocardial infarction: Secondary | ICD-10-CM | POA: Diagnosis not present

## 2023-02-10 LAB — CBC
HCT: 34.1 % — ABNORMAL LOW (ref 36.0–46.0)
Hemoglobin: 10.1 g/dL — ABNORMAL LOW (ref 12.0–15.0)
MCH: 30.5 pg (ref 26.0–34.0)
MCHC: 29.6 g/dL — ABNORMAL LOW (ref 30.0–36.0)
MCV: 103 fL — ABNORMAL HIGH (ref 80.0–100.0)
Platelets: 277 10*3/uL (ref 150–400)
RBC: 3.31 MIL/uL — ABNORMAL LOW (ref 3.87–5.11)
RDW: 19.9 % — ABNORMAL HIGH (ref 11.5–15.5)
WBC: 5.2 10*3/uL (ref 4.0–10.5)
nRBC: 0 % (ref 0.0–0.2)

## 2023-02-10 LAB — GLUCOSE, CAPILLARY
Glucose-Capillary: 96 mg/dL (ref 70–99)
Glucose-Capillary: 142 mg/dL — ABNORMAL HIGH (ref 70–99)
Glucose-Capillary: 112 mg/dL — ABNORMAL HIGH (ref 70–99)
Glucose-Capillary: 147 mg/dL — ABNORMAL HIGH (ref 70–99)

## 2023-02-10 LAB — BASIC METABOLIC PANEL
Anion gap: 11 (ref 5–15)
BUN: 32 mg/dL — ABNORMAL HIGH (ref 8–23)
CO2: 29 mmol/L (ref 22–32)
Calcium: 9.6 mg/dL (ref 8.9–10.3)
Chloride: 102 mmol/L (ref 98–111)
Creatinine, Ser: 1.4 mg/dL — ABNORMAL HIGH (ref 0.44–1.00)
GFR, Estimated: 38 mL/min — ABNORMAL LOW (ref >60)
Glucose, Bld: 103 mg/dL — ABNORMAL HIGH (ref 70–99)
Potassium: 4.2 mmol/L (ref 3.5–5.1)
Sodium: 142 mmol/L (ref 135–145)

## 2023-02-10 LAB — COOXEMETRY PANEL
Carboxyhemoglobin: 3.2 % — ABNORMAL HIGH (ref 0.5–1.5)
Methemoglobin: <0.7 % (ref 0.0–1.5)
O2 Saturation: 70.9 %
Total hemoglobin: 10.3 g/dL — ABNORMAL LOW (ref 12.0–16.0)

## 2023-02-10 LAB — MAGNESIUM: Magnesium: 2 mg/dL (ref 1.7–2.4)

## 2023-02-10 NOTE — TOC Initial Note (Addendum)
Transition of Care [facility]) - Initial/Assessment Note    Patient Details  Name: Natasha Woodard MRN: [id number] Date of Birth: 08-29-1941  Transition of Care [facility]) CM/SW Contact:    Erenest Rasher, RN Phone Number: [phone] 02/10/2023, 1:56 PM  Clinical Narrative:     Received message from [facility] rehab coordinator and pt has not be deemed a candidate for CIR. Will discuss SNF rehab vs HH with pt and family. Attending states pt will not likely dc over weekend. Will continue to work on [employer].                CM spoke to pt, husband and son at bedside. Discussed in detail options at discharge. Explained she was recommended for IP rehab. And explained pt will have wound vac for healing of wound and once she completed IP rehab. She will go home with [facility] RN for wound vac changes. Provided education on Medicare Rights if pt and husband did not agree with dc they could appeal. Educated CM will need to have dc plan in place when she is ready. Pt, husband and son verbalized understanding. Husband wanted to speak to the surgeon again wound healing and timeframe for the healing of wound.    Expected Discharge Plan: IP Rehab Facility Barriers to Discharge: Continued Medical Work up   Patient Goals and CMS Choice Patient states their goals for this hospitalization and ongoing recovery are:: wants mother to get better CMS Medicare.gov Compare Post Acute Care list provided to:: Patient Represenative (must comment) (husband) Choice offered to / list presented to : Spouse      Expected Discharge Plan and Services   Discharge Planning Services: CM Consult Post Acute Care Choice: IP Rehab Living arrangements for the past 2 months: Single Family Home                                      Prior Living Arrangements/Services Living arrangements for the past 2 months: Single Family Home Lives with:: Spouse, Adult Children Patient language and need for interpreter reviewed:: Yes Do you feel  safe going back to the place where you live?: Yes      Need for Family Participation in Patient Care: Yes (Comment) Care giver support system in place?: Yes (comment) Current home services: DME (CPAP, wheelchair, cane) Criminal Activity/Legal Involvement Pertinent to Current Situation/Hospitalization: No - Comment as needed  Activities of Daily Living Home Assistive Devices/Equipment: Cane (specify quad or straight) ADL Screening (condition at time of admission) Patient's cognitive ability adequate to safely complete daily activities?: Yes Is the patient deaf or have difficulty hearing?: No Does the patient have difficulty seeing, even when wearing glasses/contacts?: No Does the patient have difficulty concentrating, remembering, or making decisions?: No Patient able to express need for assistance with ADLs?: Yes Does the patient have difficulty dressing or bathing?: No Independently performs ADLs?: Yes (appropriate for developmental age) Does the patient have difficulty walking or climbing stairs?: No Weakness of Legs: None Weakness of Arms/Hands: None  Permission Sought/Granted Permission sought to share information with : Case Manager, Family Supports, PCP Permission granted to share information with : Yes, Verbal Permission Granted  Share Information with NAME: Aretzy Clendenen     Permission granted to share info w Relationship: husband  Permission granted to share info w Contact Information: [phone]  Emotional Assessment Appearance:: Appears stated age Attitude/Demeanor/Rapport: Engaged Affect (typically observed): Accepting  Orientation: : Oriented to Self, Oriented to Place, Oriented to  Time, Oriented to Situation   Psych Involvement: No (comment)  Admission diagnosis:  NSTEMI (non-ST elevated myocardial infarction) [facility]) [I21.4] Patient Active Problem List   Diagnosis Date Noted   Malnutrition of moderate degree 01/28/2023   NSTEMI (non-ST elevated myocardial  infarction) ([place]) 01/10/2023   ACS (acute coronary syndrome) ([place]) 01/10/2023   Ischemic cardiomyopathy 01/10/2023   Nonrheumatic mitral valve regurgitation 01/10/2023   Vulvar cysts 12/02/2022   Vaginal candida 12/02/2022   Renal lesion 11/13/2022   ILD (interstitial lung disease) ([place]) 10/11/2022   Abnormal CT scan, kidney 10/11/2022   Bradycardia on ECG 08/31/2022   Superficial fungal infection of skin 03/20/2022   Onychomycosis of multiple toenails with type 2 diabetes mellitus and peripheral angiopathy ([place]) 11/16/2021   Pain due to onychomycosis of toenail of left foot 11/16/2021   Intermittent claudication of both lower extremities due to atherosclerosis ([place]) 08/22/2021   Ganglion cyst of wrist 08/22/2021   Rash 02/17/2021   On amiodarone therapy 08/21/2020   CKD (chronic kidney disease) stage 3, GFR 30-59 ml/min ([place]) 09/25/2019   Gastroesophageal reflux disease 06/19/2019   Health care maintenance 11/14/2018   Joint pain 08/07/2018   Cough 06/17/2018   Sensorineural hearing loss (SNHL), bilateral 03/20/2018   Complex sleep apnea syndrome 10/05/2017   Chronic heart failure with preserved ejection fraction (HFpEF) ([place]) 09/08/2017   Dilated cardiomyopathy ([place]) 06/30/2017   Frequent PVCs 06/30/2017   Advance care planning 08/19/2014   Medicare annual wellness visit, subsequent 07/30/2013   Type II diabetes mellitus with peripheral artery disease ([place]) 05/19/2010   Hyperlipidemia associated with type 2 diabetes mellitus ([place]) 05/19/2010   Essential hypertension 05/19/2010   Ichthyosis 05/19/2010   PCP:  Tonia Ghent, MD Pharmacy:   [facility] [place], [place] - [address] AT [place] [address] [place] [address] Phone: [phone] Fax: [phone]  [facility], [place] [address] [place] [address] Phone: [phone] Fax: [phone]  [facility] [address] [address] Phone: [phone] Fax: [phone]     Social Determinants of Health (SDOH) Social History: SDOH Screenings   Food Insecurity: No Food Insecurity (01/12/2023)  Housing: Low Risk  (01/12/2023)  Transportation Needs: No Transportation Needs (01/12/2023)  Utilities: Not At Risk (01/12/2023)  Depression (PHQ2-9): Low Risk  (05/12/2022)  Financial Resource Strain: Low Risk  (05/12/2022)  Physical Activity: Sufficiently Active (05/12/2022)  Stress: No Stress Concern Present (05/12/2022)  Tobacco Use: Medium Risk (02/07/2023)   SDOH Interventions: Food Insecurity Interventions: Intervention Not Indicated Housing Interventions: Intervention Not Indicated Transportation Interventions: Intervention Not Indicated Utilities Interventions: Intervention Not Indicated   Readmission Risk Interventions     No data to display

## 2023-02-10 NOTE — Progress Notes (Signed)
Physical Therapy Treatment Patient Details Name: Natasha Woodard MRN: [id number] DOB: 09/19/1941 Today's Date: 02/10/2023   History of Present Illness 82 yo female admitted 1/30 with chest pain, NSTEMI s/p cardiac cath and IABP placed. Pt with bleeding and impaired circulation with IABP removed 1/31 and thrombin injection to pseudoaneurysm. 2/4 Rt groin pseudoaneurysm rupture s/p repair with placement of wound VAC. Pt with VT arrest on 2/8. ICD placed 2/12. Pt with rt groin washout and debridement and sartorius muscle flap with VAC placement on 2/15. PMhx: CAD, dilated cardiomyopathy, CKD, GERD, HFpEF, HTN, HLD, T2DM.    PT Comments    PTA returned to room to review post-ICD placement mobility precautions with handout printed per spouse request. PTA highlighted important areas pertaining to mobility and exercise precs and pt/spouse receptive to instruction. Supine LE HEP handout printed to review with them next session, MD entering room at end of session so defer teachback on LE HEP at this time. Pt continues to benefit from PT services to progress toward functional mobility goals.    Recommendations for follow up therapy are one component of a multi-disciplinary discharge planning process, led by the attending physician.  Recommendations may be updated based on patient status, additional functional criteria and insurance authorization.  Follow Up Recommendations  Acute inpatient rehab (3hours/day)     Assistance Recommended at Discharge Frequent or constant Supervision/Assistance  Patient can return home with the following A little help with walking and/or transfers;A little help with bathing/dressing/bathroom;Help with stairs or ramp for entrance;Assist for transportation;Assistance with cooking/housework   Equipment Recommendations  Rolling walker (2 wheels)    Recommendations for Other Services       Precautions / Restrictions Precautions Precautions: Fall;ICD/Pacemaker;Other  (comment) (LUE ICD) Precaution Comments: wound VAC rt groin, LUE ICD placed 2/12 Restrictions Weight Bearing Restrictions: No     Mobility  Bed Mobility    General bed mobility comments: pt received/remained in supine; pt just got back to bed and defers OOB, session primarily focused on education on post-ICD movement restrictions      Balance Overall balance assessment: Needs assistance Sitting-balance support: Feet supported Sitting balance-Leahy Scale: Fair Sitting balance - Comments: no UE support with static sitting   Standing balance support: Bilateral upper extremity supported, Reliant on assistive device for balance Standing balance-Leahy Scale: Poor Standing balance comment: dependence on BUE support                            Cognition Arousal/Alertness: Awake/alert Behavior During Therapy: WFL for tasks assessed/performed Overall Cognitive Status: Within Functional Limits for tasks assessed                                 General Comments: HoH, increased time to process and respond to cues. Spouse present and receptive to education provided.        Exercises Other Exercises Other Exercises: Pt spouse asking questions about shoulder handout given by OT, info for post-ICD placement reviewed with him, PTA highlighted areas pertaining to movement restrictions which are typically 6 wks post-op. No lifting LUE arm over her shoulder height, pt/spouse receptive to this info.    General Comments General comments (skin integrity, edema, etc.): VSS on RA; wound vac beeping for air leak and PTA checked and placed wash cloth around area where seal appeared compromised (pt remained in supine) which allowed it to seal fully, RN notified  in case it happens again, may need additional tape or seal applied. Defer LE HEP at this time given pt fatigue and wound vac poor seal, handout given to review and plan to have pt demo back next session (likely Monday)       Pertinent Vitals/Pain Pain Assessment Pain Assessment: Faces Faces Pain Scale: Hurts a little bit Pain Location: R groin, generalized Pain Descriptors / Indicators: Sore Pain Intervention(s): Monitored during session           PT Goals (current goals can now be found in the care plan section) Acute Rehab PT Goals Patient Stated Goal: to get stronger at therapy prior to home PT Goal Formulation: With patient Time For Goal Achievement: 02/23/23 Progress towards PT goals: Progressing toward goals    Frequency    Min 3X/week      PT Plan Current plan remains appropriate       AM-PAC PT "6 Clicks" Mobility   Outcome Measure  Help needed turning from your back to your side while in a flat bed without using bedrails?: A Little Help needed moving from lying on your back to sitting on the side of a flat bed without using bedrails?: A Little Help needed moving to and from a bed to a chair (including a wheelchair)?: A Little Help needed standing up from a chair using your arms (e.g., wheelchair or bedside chair)?: A Little Help needed to walk in hospital room?: A Lot (mod safety cues) Help needed climbing 3-5 steps with a railing? : A Lot (mod safety cues) 6 Click Score: 16    End of Session Equipment Utilized During Treatment: Gait belt Activity Tolerance: Patient tolerated treatment well Patient left: with call bell/phone within reach;in bed;with bed alarm set;with family/visitor present (spouse in the room and receptive) Nurse Communication: Mobility status PT Visit Diagnosis: Unsteadiness on feet (R26.81);Other abnormalities of gait and mobility (R26.89);Muscle weakness (generalized) (M62.81)     Time: [id number] PT Time Calculation (min) (ACUTE ONLY): 10 min  Charges:   $Therapeutic Activity: 8-22 mins                     Haly Feher P., PTA Acute Rehabilitation Services Secure Chat Preferred 9a-5:30pm Office: [place] 02/10/2023, 3:34 PM

## 2023-02-10 NOTE — Progress Notes (Addendum)
Patient ID: Natasha Woodard, female   DOB: Jul 21, 1941, 82 y.o.   MRN: [id number]     Advanced Heart Failure Rounding Note  PCP-Cardiologist: Dr. Ellyn Hack AHF: Dr. 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 Now admitted for acute lateral wall MI 2/2 distal LCx occlusion c/b severe ischemic MR and acute systolic heart failure w/ low output, CI 2.1 on RHC. Required placement of IABP, but later removed given concerns for developing limb ischemia=>found to have Right femoral PSA then ultimate rupture s/p emergent repair. VT arrest 2/8. ICD placed 2/12.  Subjective:   1/31 Underwent thrombin injection to right femoral PSA . Hgb drifting down 10.3>8.3>6.8  2/1 Started on amio drip to suppress PVCs. Got dose of IV lasix x1. CVP low.  2/4 Ruptured PSA. Returned to OR for repair. VAC in place 2/5 Midodrine 5 mg three times daily added.  Back in A fib.  2/7 TEE with mod MR.  2/8 VT arrest--> shock x1 ROSC after 2 minutes. Given lidocaine and amio was increased to 60 mg.  2/9 Transvenous pacemaker placed for overdrive pacing with PMVT.  2/11 Milrinone restarted, Co-ox 49% 2/12 PICC removed. S/p [employer] ICD  2/14 Milrinone stopped . Amio drip stopped.  2/15 Returned to OR for washout and VAC change with sartorious muscle flap.  2/20 OR for washout and wound vac placement.   2/26 OR groin washout, debridement and vac change   Milrinone stopped 2/28. Co-ox has been stable.    Weight stable, restarted PO diuretics yesterday.   Feels good this morning. Got good sleep. Denies CP/SOB.   Objective:   Weight Range: 67.6 kg Body mass index is 27.26 kg/m.   Vital Signs:   Temp:  [97 F (36.1 C)-98.8 F (37.1 C)] 98.8 F (37.1 C) (03/01 0831) Pulse Rate:  [69-70] 70 (03/01 0831) Resp:  [15-20] 16 (03/01 0831) BP: (119-136)/(55-73) 136/65 (03/01 0831) SpO2:  [98 %-100 %] 98 % (03/01 0831) Weight:  [67.6 kg]  67.6 kg (03/01 0630) Last BM Date : 02/09/23  Weight change: Filed Weights   02/08/23 0430 02/09/23 0500 02/10/23 0630  Weight: 68.2 kg 68 kg 67.6 kg    Intake/Output:   Intake/Output Summary (Last 24 hours) at 02/10/2023 0838 Last data filed at 02/09/2023 1825 Gross per 24 hour  Intake --  Output 0 ml  Net 0 ml    PHYSICAL EXAM: CVP 9 General:  chronically ill appearing.  No respiratory difficulty HEENT: normal Neck: supple. JVD ~8 cm. Carotids 2+ bilat; no bruits. No lymphadenopathy or thyromegaly appreciated. Cor: PMI nondisplaced. Regular rate & rhythm. No rubs, gallops or murmurs. Lungs: clear Abdomen: soft, nontender, nondistended. No hepatosplenomegaly. No bruits or masses. Good bowel sounds. Extremities: no cyanosis, clubbing, rash, +1 rLE edema. Wound vac to R groin. PICC RUE Neuro: alert & oriented x 3, cranial nerves grossly intact. moves all 4 extremities w/o difficulty. Affect pleasant.   Telemetry   A Paced 70s (Personally reviewed)    Labs    CBC Recent Labs    02/09/23 0340 02/10/23 0500  WBC 5.7 5.2  HGB 10.2* 10.1*  HCT 34.5* 34.1*  MCV 103.9* 103.0*  PLT 287 [id number]   Basic Metabolic Panel Recent Labs    02/09/23 0340 02/10/23 0500  NA 142 142  K 4.5 4.2  CL 100 102  CO2 32 29  GLUCOSE 112* 103*  BUN 44* 32*  CREATININE 1.41* 1.40*  CALCIUM 9.7 9.6  MG 2.3 2.0   BNP: BNP (last 3 results) Recent Labs    08/31/22 1613 01/10/23 1048 02/01/23 1239  BNP 1,178.5* >4,500.0* 914.7*   Imaging   No results found.  Medications:     Scheduled Medications:  acetaminophen  650 mg Oral Q6H   amiodarone  200 mg Oral BID   apixaban  2.5 mg Oral BID   Chlorhexidine Gluconate Cloth  6 each Topical Daily   clopidogrel  75 mg Oral Daily   feeding supplement  237 mL Oral TID BM   fentaNYL (SUBLIMAZE) injection  50 mcg Intravenous Once   insulin aspart  0-15 Units Subcutaneous TID WC   lidocaine  1 patch Transdermal Q24H   lidocaine  1 patch  Transdermal Q24H   multivitamin with minerals  1 tablet Oral Daily   nutrition supplement (JUVEN)  1 packet Oral BID BM   rosuvastatin  10 mg Oral QHS   sodium chloride flush  3 mL Intravenous Q12H   spironolactone  12.5 mg Oral Daily   torsemide  20 mg Oral Daily    Infusions:  sodium chloride 250 mL (01/23/23 1235)   piperacillin-tazobactam (ZOSYN)  IV 3.375 g (02/10/23 0626)   PRN Medications: sodium chloride, acetaminophen, alum & mag hydroxide-simeth, hydrocortisone cream, ipratropium-albuterol, melatonin, nitroGLYCERIN, ondansetron (ZOFRAN) IV, mouth rinse, oxyCODONE, simethicone, sodium chloride flush, traMADol  Assessment/Plan  1. Cardiogenic shock/acute on chronic systolic CHF: Echo this admission with EF 20-25%, mild LV dilation, mild RV dysfunction, severe MR with restricted posterior leaflet.  Echo in 2/23 with EF 50-55%.  Patient has history of nonischemic cardiomyopathy (?PVC-mediated) that improved.  She reports 2 months of dyspnea then had lateral STEMI 1/30 with chest pain.  PLOM occlusion not thought to explain the extent of her cardiomyopathy, so suspect EF may have been falling prior to this event (symptomatic at least 2 months).  Suspect mixed ischemic/nonischemic cardiomyopathy. She initially had IABP for cardiogenic shock, now removed due to right leg ischemia. Was weaned off pressors/inotropes but Milrinone restarted 2/23 with volume overload and poor diuresis.   - Volume status improved.  Euvolemic on exam. Renal fx stable and back to b/l  - Milrinone stopped 2/28. Co-ox stable at 71%. CVP 9 - Continue spironolactone 12.5 daily.   - Continue torsemide at 20 mg daily - Previously on midodrine, BPs have been stable since the 14th. Start losartan 12.5 mg daily - Hold off on SGLT2i given groin infection.  - additional GDMT initially limited by hypotension and AKI, now off midodrine.  - S/p  DDD ICD.  - Strict I&O, daily weights 2. Mitral regurgitation: severe MR with  restricted posterior leaflet on initial echo, suspect infarct-related MR with PLOM occlusion. TEE was later done showing moderate MR.  3. CAD: Lateral STEMI with occluded pLOM, treated with DES.   - Off ASA given apixaban use.  - Switched from Brilinta to plavix.  - Continue statin.  4. 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  - Continue amio 200 mg twice a day.   5. Right femoral PSA: s/p thrombin injection by Dr. Carlis Abbott. 2/4 stat u/s with recurrent PSA with active bleeding into PSA. S/p PSA repair with VAC placement. 2/15 VAC change with sartorious muscle flap.  Vascular following. Went to OR 2/26 for washout and wound vac placement.   - She is on Zosyn for wound infection.  - Wound vac changed at bedside yesterday 2/29. Plan  for further wound vac changed by WOC. Ok to go to [employer] from vas standpoint. 6. AKI on CKD stage 3b: Baseline 1.3-1.5.  Creatinine trended up to 2.06. Milrinone restarted for low output. SCr 1.41 today  7.  Acute blood loss anemia:  Hgb stable.  8. Atrial fibrillation: Paroxysmal. Maintaining SR.  - Amiodarone 200 mg bid  - Continue apixaban 2.5 bid.  9. VT Arrest: 2/7, DCCV x 1 with CPR, ROSC after 2 minutes.  Polymorphic VT, appeared bradycardia-mediated with intermittent junctional rhythm. - s/p DDD ICD on 2/12 - Continue amiodarone.  Disposition: PT evaluated, plan for possible CIR at discharge. Vascular surgery met with patient yesterday and agreed with CIR. Unfortunately patient was seen by CIR coordinator and felt not to be a candidate as patient's husband wants groin wound to be fully healed while IP.   Earnie Larsson AGACNP-BC  02/10/2023  Patient seen and examined with the above-signed Advanced Practice Provider and/or Housestaff. I personally reviewed laboratory data, imaging studies and relevant notes. I independently examined the patient and formulated the important aspects of the plan. I have edited the note to reflect any of my  changes or salient points. I have personally discussed the plan with the patient and/or family.  Denies SOB, orthopnea or PND. Vitals stable. Volume status looks good.   Turned down for CIR due to comments made by husband to CIR admissions team regarding his expectations for care.   General:  Weak appearing. No resp difficulty HEENT: normal Neck: supple. no JVD. Carotids 2+ bilat; no bruits. No lymphadenopathy or thryomegaly appreciated. Cor: PMI nondisplaced. Regular rate & rhythm. 2/6 MR Lungs: clear Abdomen: soft, nontender, nondistended. No hepatosplenomegaly. No bruits or masses. Good bowel sounds. Extremities: no cyanosis, clubbing, rash, tr edema R groin wound vac  Neuro: alert & orientedx3, cranial nerves grossly intact. moves all 4 extremities w/o difficulty. Affect pleasant  Currently doing well from HF standpoint. Rhythm stable. Agree with adding losartan today.   I think she would benefit greatly from CIR given her debility and prolonged hospitalization but husband saying that his wife is not leaving the hospital until her wound is healed. I tried to explain to him that this would take time and she would get excellent for wound [facility] at [facility].   HF team will follow from a distance. Please call with questions. Time spent 35 mins.   Glori Bickers, MD  6:18 PM

## 2023-02-10 NOTE — Progress Notes (Signed)
Physical Therapy Treatment Patient Details Name: Natasha Woodard MRN: [id number] DOB: 08/15/1941 Today's Date: 02/10/2023   History of Present Illness 82 yo female admitted 1/30 with chest pain, NSTEMI s/p cardiac cath and IABP placed. Pt with bleeding and impaired circulation with IABP removed 1/31 and thrombin injection to pseudoaneurysm. 2/4 Rt groin pseudoaneurysm rupture s/p repair with placement of wound VAC. Pt with VT arrest on 2/8. ICD placed 2/12. Pt with rt groin washout and debridement and sartorius muscle flap with VAC placement on 2/15. PMhx: CAD, dilated cardiomyopathy, CKD, GERD, HFpEF, HTN, HLD, T2DM.    PT Comments    Pt received in supine, agreeable to therapy session and with good participation and tolerance for transfer, gait and stair training. Pt needing increased time due to multiple lines, need for standing/seated rest breaks intermittently and need for toileting assist. Pt needing up to minA for transfers from elevated surface heights and for gait with RW as well as stair negotiation with platform step in her room. Pt continues to benefit from PT services to progress toward functional mobility goals.    Recommendations for follow up therapy are one component of a multi-disciplinary discharge planning process, led by the attending physician.  Recommendations may be updated based on patient status, additional functional criteria and insurance authorization.  Follow Up Recommendations  Acute inpatient rehab (3hours/day)     Assistance Recommended at Discharge Frequent or constant Supervision/Assistance  Patient can return home with the following A little help with walking and/or transfers;A little help with bathing/dressing/bathroom;Help with stairs or ramp for entrance;Assist for transportation;Assistance with cooking/housework   Equipment Recommendations  Rolling walker (2 wheels)    Recommendations for Other Services       Precautions / Restrictions  Precautions Precautions: Fall;ICD/Pacemaker;Other (comment) (LUE ICD) Precaution Comments: wound VAC rt groin, LUE ICD placed 2/12 Restrictions Weight Bearing Restrictions: No     Mobility  Bed Mobility Overal bed mobility: Needs Assistance Bed Mobility: Rolling, Sidelying to Sit, Sit to Supine Rolling: Min guard Sidelying to sit: Min guard, HOB elevated       General bed mobility comments: use of bed features    Transfers Overall transfer level: Needs assistance Equipment used: Rolling walker (2 wheels) Transfers: Sit to/from Stand Sit to Stand: Min guard           General transfer comment: from EOB, BSC and chair heights, cues for safer UE placement needed each transfer.    Ambulation/Gait Ambulation/Gait assistance: Min assist Gait Distance (Feet): 80 Feet Assistive device: Rolling walker (2 wheels) Gait Pattern/deviations: Step-through pattern, Trunk flexed, Drifts right/left, Decreased stride length Gait velocity: decreased     General Gait Details: minA for safety, activity pacing, and pt with decreased line awareness/awareness of environmental obstacles. Pt may have some visual deficits.   Stairs Stairs: Yes Stairs assistance: Min assist Stair Management: Two rails, Step to pattern, Forwards Number of Stairs: 6 General stair comments: 7" platform step in the room, pt given initial cues for step-to pattern leading with her LLE.   Wheelchair Mobility    Modified Rankin (Stroke Patients Only)       Balance Overall balance assessment: Needs assistance Sitting-balance support: Feet supported Sitting balance-Leahy Scale: Fair Sitting balance - Comments: no UE support with static sitting   Standing balance support: Bilateral upper extremity supported, Reliant on assistive device for balance Standing balance-Leahy Scale: Poor Standing balance comment: dependence on BUE support  Cognition Arousal/Alertness:  Awake/alert Behavior During Therapy: WFL for tasks assessed/performed Overall Cognitive Status: Within Functional Limits for tasks assessed                                 General Comments: HoH, increased time to process and respond to cues.        Exercises Other Exercises Other Exercises: Pt spouse asking questions about shoulder handout given by OT, plan to print info for pt/spouse on ICD movement restrictions for affected extremity, will bring to her room next session to reinforce.    General Comments General comments (skin integrity, edema, etc.): VSS on RA      Pertinent Vitals/Pain Pain Assessment Pain Assessment: Faces Faces Pain Scale: Hurts a little bit Pain Location: R groin, generalized Pain Descriptors / Indicators: Sore Pain Intervention(s): Monitored during session, Repositioned     PT Goals (current goals can now be found in the care plan section) Acute Rehab PT Goals Patient Stated Goal: to get stronger at therapy prior to home PT Goal Formulation: With patient Time For Goal Achievement: 02/23/23 Progress towards PT goals: Progressing toward goals    Frequency    Min 3X/week      PT Plan Current plan remains appropriate       AM-PAC PT "6 Clicks" Mobility   Outcome Measure  Help needed turning from your back to your side while in a flat bed without using bedrails?: A Little Help needed moving from lying on your back to sitting on the side of a flat bed without using bedrails?: A Little Help needed moving to and from a bed to a chair (including a wheelchair)?: A Little Help needed standing up from a chair using your arms (e.g., wheelchair or bedside chair)?: A Little Help needed to walk in hospital room?: A Lot (mod safety cues) Help needed climbing 3-5 steps with a railing? : A Lot (mod safety cues) 6 Click Score: 16    End of Session Equipment Utilized During Treatment: Gait belt Activity Tolerance: Patient tolerated treatment  well Patient left: with call bell/phone within reach;in chair;with chair alarm set;with family/visitor present (spouse in the room) Nurse Communication: Mobility status PT Visit Diagnosis: Unsteadiness on feet (R26.81);Other abnormalities of gait and mobility (R26.89);Muscle weakness (generalized) (M62.81)     Time: [id number] PT Time Calculation (min) (ACUTE ONLY): 44 min  Charges:  $Gait Training: 23-37 mins $Therapeutic Activity: 8-22 mins                     Kagan Mutchler P., PTA Acute Rehabilitation Services Secure Chat Preferred 9a-5:30pm Office: [place] 02/10/2023, 2:55 PM

## 2023-02-10 NOTE — Plan of Care (Signed)
  Problem: Education: Goal: Understanding of cardiac disease, CV risk reduction, and recovery process will improve Outcome: Progressing   Problem: Activity: Goal: Ability to tolerate increased activity will improve Outcome: Progressing   Problem: Cardiac: Goal: Ability to achieve and maintain adequate cardiovascular perfusion will improve Outcome: Progressing   Problem: Education: Goal: Ability to describe self-care measures that may prevent or decrease complications (Diabetes Survival Skills Education) will improve Outcome: Progressing   Problem: Fluid Volume: Goal: Ability to maintain a balanced intake and output will improve Outcome: Progressing

## 2023-02-10 NOTE — Progress Notes (Signed)
CARDIAC REHAB PHASE I   Stopped by to check in on pt mobility and education needs. Pt mobility slowly progressing with PT, PT will see later today. Pt has gotten oob to chair this morning. Reviewed post MI education and CRP2 services available. Referral has been sent to [facility] for CRP2 when and if pt able to participate. Pt and family have many questions and concerns regarding CIR and discharge planning. Will rely concerns to case management.    [id number]    Vanessa Barbara, RN BSN 02/10/2023 11:07 AM

## 2023-02-10 NOTE — Progress Notes (Signed)
  Progress Note   Patient: Natasha Woodard [id number] DOB: 08/13/1941 DOA: 01/10/2023     30 DOS: the patient was seen and examined on 02/10/2023   Brief hospital course:  Assessment and Plan:  Acute lateral wall MI  -  Distal occlusion of the circumflex marginal vessel complicated by severe ischemic MR and acute systolic CHF treated with DES - Eliquis 2.5 mg PO bid  - Plavix 75 mg PO daily  - Crestor 10 mg PO daily    Cardiogenic shock Severe ischemic MR and acute systolic CHF - Torsemide 20 mg PO daily  - Aldactone 12.5 mg PO daily  - TEE 2/7 confirmed severe MR - ICD placement 2/12   Paroxysmal atrial fibrillation VT arrest 2/7 - Amiodarone 200 mg PO bid  - Eliquis 2.5 mg PO bid    Right femoral pseudoaneurysm Right groin wound, secondary infection Patient developed pseudoaneurysm following balloon pump, s/p thrombin injection by Dr. Carlis Abbott 1/31.  2/4 stat u/s showed recurrent PSA with active bleeding into PSA. S/p PSA repair with VAC placement.  2/15 VAC change with sartorious muscle flap.  2/20, went to the OR for washout and wound VAC placement. 2/27, underwent wound VAC change in OR.  Tentative plan of wound VAC change at bedside on 3/1. Wound culture showed polymicrobial growth showing moderate Morganella and Enterococcus with few  pseudomonas.   - IV zosyn 3.375 g q8hr  - Tramadol 50 mg PO q12 PRN  - Oxycodone 5 mg PO q6 hr PRN    AKI on CKD stage 3b - Monitor   Chronic anemia Severe iron deficiency - Monitor   Impaired hearing - Complicating care   DVT prophylaxis: Eliquis as above        Subjective: Pt seen and examined at the bedside. Labs remain stable. Wound vac evaluated this morning. Further management of the wound per vascular surgery.   Physical Exam: Vitals:   02/10/23 0831 02/10/23 0859 02/10/23 0901 02/10/23 1216  BP: 136/65 120/62 120/62 129/68  Pulse: 70 70 70 70  Resp: '16  16 16  '$ Temp: 98.8 F (37.1 C)  97.7 F (36.5 C) 97.8 F  (36.6 C)  TempSrc: Oral  Oral Oral  SpO2: 98% 97% 100% 100%  Weight:      Height:       Physical Exam Constitutional:      Appearance: She is well-developed.  HENT:     Head: Normocephalic.  Cardiovascular:     Rate and Rhythm: Normal rate and regular rhythm.  Pulmonary:     Effort: Pulmonary effort is normal.  Abdominal:     Palpations: Abdomen is soft.  Skin:    General: Skin is warm.  Neurological:     Mental Status: She is alert.     Comments: At baseline, hard of hearing   Psychiatric:        Mood and Affect: Mood normal.    Data Reviewed:   Disposition: Status is: Inpatient  Planned Discharge Destination: Barriers to discharge: Healing of the R groin wound     Time spent: 35 minutes  Author: Lucienne Minks , MD 02/10/2023 1:25 PM  For on call review [email or web address].

## 2023-02-10 NOTE — Consult Note (Signed)
WOC Nurse Consult Note: R groin washout and debridement 2/26, first NPWT dressing change by vascular 2/29  WOC consulted for NPWT dressing changes moving forward starting Monday 02/13/2023.    WOC will follow this patient for NPWT dressing changes beginning 02/13/2023 as per vascular request.   Thank you,    Shakedra Beam MSN, RN-BC, CWON

## 2023-02-11 DIAGNOSIS — I214 Non-ST elevation (NSTEMI) myocardial infarction: Secondary | ICD-10-CM | POA: Diagnosis not present

## 2023-02-11 LAB — CBC
HCT: 33.1 % — ABNORMAL LOW (ref 36.0–46.0)
Hemoglobin: 10.1 g/dL — ABNORMAL LOW (ref 12.0–15.0)
MCH: 31.5 pg (ref 26.0–34.0)
MCHC: 30.5 g/dL (ref 30.0–36.0)
MCV: 103.1 fL — ABNORMAL HIGH (ref 80.0–100.0)
Platelets: 266 10*3/uL (ref 150–400)
RBC: 3.21 MIL/uL — ABNORMAL LOW (ref 3.87–5.11)
RDW: 19.6 % — ABNORMAL HIGH (ref 11.5–15.5)
WBC: 4.4 10*3/uL (ref 4.0–10.5)
nRBC: 0 % (ref 0.0–0.2)

## 2023-02-11 LAB — BASIC METABOLIC PANEL
Anion gap: 6 (ref 5–15)
BUN: 37 mg/dL — ABNORMAL HIGH (ref 8–23)
CO2: 33 mmol/L — ABNORMAL HIGH (ref 22–32)
Calcium: 9.7 mg/dL (ref 8.9–10.3)
Chloride: 103 mmol/L (ref 98–111)
Creatinine, Ser: 1.48 mg/dL — ABNORMAL HIGH (ref 0.44–1.00)
GFR, Estimated: 35 mL/min — ABNORMAL LOW (ref >60)
Glucose, Bld: 106 mg/dL — ABNORMAL HIGH (ref 70–99)
Potassium: 3.9 mmol/L (ref 3.5–5.1)
Sodium: 142 mmol/L (ref 135–145)

## 2023-02-11 LAB — COOXEMETRY PANEL
Carboxyhemoglobin: 3.7 % — ABNORMAL HIGH (ref 0.5–1.5)
Methemoglobin: 1.3 % (ref 0.0–1.5)
O2 Saturation: 68.9 %
Total hemoglobin: 10.3 g/dL — ABNORMAL LOW (ref 12.0–16.0)

## 2023-02-11 LAB — GLUCOSE, CAPILLARY
Glucose-Capillary: 118 mg/dL — ABNORMAL HIGH (ref 70–99)
Glucose-Capillary: 150 mg/dL — ABNORMAL HIGH (ref 70–99)
Glucose-Capillary: 157 mg/dL — ABNORMAL HIGH (ref 70–99)
Glucose-Capillary: 98 mg/dL (ref 70–99)
Glucose-Capillary: 112 mg/dL — ABNORMAL HIGH (ref 70–99)

## 2023-02-11 LAB — MAGNESIUM: Magnesium: 2.1 mg/dL (ref 1.7–2.4)

## 2023-02-11 LAB — C-REACTIVE PROTEIN: CRP: 1.2 mg/dL — ABNORMAL HIGH (ref <1.0)

## 2023-02-11 MED ORDER — TRAMADOL HCL 50 MG PO TABS
50.0000 mg | ORAL_TABLET | Freq: Two times a day (BID) | ORAL | Status: DC
  Administered 2023-02-11 – 2023-02-14 (×6): 50 mg via ORAL
  Filled 2023-02-11 (×7): qty 1

## 2023-02-11 MED ORDER — TRAMADOL HCL 50 MG PO TABS
50.0000 mg | ORAL_TABLET | Freq: Once | ORAL | Status: AC
  Administered 2023-02-11: 50 mg via ORAL

## 2023-02-11 MED ORDER — LOSARTAN POTASSIUM 25 MG PO TABS
12.5000 mg | ORAL_TABLET | Freq: Every day | ORAL | Status: DC
  Administered 2023-02-11 – 2023-02-19 (×9): 12.5 mg via ORAL
  Filled 2023-02-11 (×9): qty 1

## 2023-02-11 NOTE — Progress Notes (Signed)
  Progress Note   Patient: Natasha Woodard [id number] DOB: 1941-11-21 DOA: 01/10/2023     31 DOS: the patient was seen and examined on 02/11/2023   Brief hospital course:  Assessment and Plan: Acute lateral wall MI  -  Distal occlusion of the circumflex marginal vessel complicated by severe ischemic MR and acute systolic CHF treated with DES - Eliquis 2.5 mg PO bid  - Plavix 75 mg PO daily  - Crestor 10 mg PO daily    Cardiogenic shock Severe ischemic MR and acute systolic CHF - Torsemide 20 mg PO daily  - Aldactone 12.5 mg PO daily  - TEE 2/7 confirmed severe MR - ICD placement 2/12   Paroxysmal atrial fibrillation VT arrest 2/7 - Amiodarone 200 mg PO bid  - Eliquis 2.5 mg PO bid    Right femoral pseudoaneurysm Right groin wound, secondary infection Patient developed pseudoaneurysm following balloon pump, s/p thrombin injection by Dr. Carlis Abbott 1/31.  2/4 stat u/s showed recurrent PSA with active bleeding into PSA. S/p PSA repair with VAC placement.  2/15 VAC change with sartorious muscle flap.  2/20, went to the OR for washout and wound VAC placement. 2/27, underwent wound VAC change in OR.  Tentative plan of wound VAC change at bedside on 3/1. Wound culture showed polymicrobial growth showing moderate Morganella and Enterococcus with few  pseudomonas.   - IV zosyn 3.375 g q8hr  - Tramadol 50 mg PO q12 PRN  - Oxycodone 5 mg PO q6 hr PRN    AKI on CKD stage 3b - Monitor    Chronic anemia Severe iron deficiency - Monitor    Impaired hearing - Complicating care    DVT prophylaxis: Eliquis as above         Subjective: Pt seen and examined at the bedside. Spoke with the pt and pt's husband at the bedside. They advised they were told they would not be a good fit at the [facility]. Pt's husband would like the wound to show better healing prior to any type of discharge. Pt continues on IV antibx's in the meantime. They wish to remain in the hospital at this time to allow for  further wound healing.  Physical Exam: Vitals:   02/11/23 0528 02/11/23 0705 02/11/23 0759 02/11/23 1100  BP: 121/67  (!) 118/57 115/63  Pulse: 71  72 70  Resp: '18  18 18  '$ Temp: 98 F (36.7 C)  98.4 F (36.9 C) 98.8 F (37.1 C)  TempSrc: Oral  Oral Oral  SpO2: 100%  92% 94%  Weight:  67.4 kg    Height:       Constitutional:      Appearance: She is well-developed.  HENT:     Head: Normocephalic.  Cardiovascular:     Rate and Rhythm: Normal rate and regular rhythm.  Pulmonary:     Effort: Pulmonary effort is normal.  Abdominal:     Palpations: Abdomen is soft.  Skin:    General: Skin is warm.  Neurological:     Mental Status: She is alert.     Comments: At baseline, hard of hearing   Psychiatric:        Mood and Affect: Mood normal.   Data Reviewed:   Disposition: Status is: Inpatient  Planned Discharge Destination: Barriers to discharge: Wound healing     Time spent: 35 minutes  Author: Lucienne Minks , MD 02/11/2023 12:40 PM  For on call review [email or web address].

## 2023-02-11 NOTE — Progress Notes (Signed)
Mobility Specialist Progress Note:   02/11/23 1558  Mobility  Activity Ambulated with assistance in hallway  Level of Assistance Standby assist, set-up cues, supervision of patient - no hands on  Assistive Device Front wheel walker  Distance Ambulated (ft) 230 ft  Activity Response Tolerated well  $Mobility charge 1 Mobility   Pt in bed asking to walk again. No complaints of pain. Pt able to complete 2 stairs with minA for balance. Left in chair with call bell in reach and all needs met.   Gareth Eagle Sarkis Rhines Mobility Specialist Please contact via [facility] or  Rehab Office at [phone]

## 2023-02-11 NOTE — Progress Notes (Signed)
Mobility Specialist Progress Note:   02/11/23 0851  Mobility  Activity Ambulated with assistance in hallway  Level of Assistance Standby assist, set-up cues, supervision of patient - no hands on  Assistive Device Front wheel walker  Distance Ambulated (ft) 120 ft  Activity Response Tolerated well  $Mobility charge 1 Mobility   Pt EOB willing to participate in mobility. No complaints of pain. Left in chair with call bell in reach and all needs met.   Gareth Eagle Birtha Hatler Mobility Specialist Please contact via [facility] or  Rehab Office at [phone]

## 2023-02-11 NOTE — Progress Notes (Signed)
Mobility Specialist Progress Note:   02/11/23 0958  Mobility  Activity Ambulated with assistance in hallway  Level of Assistance Standby assist, set-up cues, supervision of patient - no hands on  Assistive Device Front wheel walker  Distance Ambulated (ft) 120 ft  Activity Response Tolerated well  $Mobility charge 1 Mobility   Pt in chair asking to walk again. No complaints of pain. Left in chair with call bell in reach and all needs met.  Pt completing arm exercises with theraband.   Gareth Eagle Zyara Riling Mobility Specialist Please contact via [facility] or  Rehab Office at [phone]

## 2023-02-12 DIAGNOSIS — I214 Non-ST elevation (NSTEMI) myocardial infarction: Secondary | ICD-10-CM | POA: Diagnosis not present

## 2023-02-12 LAB — BASIC METABOLIC PANEL
Anion gap: 10 (ref 5–15)
BUN: 45 mg/dL — ABNORMAL HIGH (ref 8–23)
CO2: 28 mmol/L (ref 22–32)
Calcium: 9.2 mg/dL (ref 8.9–10.3)
Chloride: 102 mmol/L (ref 98–111)
Creatinine, Ser: 1.31 mg/dL — ABNORMAL HIGH (ref 0.44–1.00)
GFR, Estimated: 41 mL/min — ABNORMAL LOW (ref >60)
Glucose, Bld: 114 mg/dL — ABNORMAL HIGH (ref 70–99)
Potassium: 4.1 mmol/L (ref 3.5–5.1)
Sodium: 140 mmol/L (ref 135–145)

## 2023-02-12 LAB — CBC
HCT: 32.1 % — ABNORMAL LOW (ref 36.0–46.0)
Hemoglobin: 9.8 g/dL — ABNORMAL LOW (ref 12.0–15.0)
MCH: 31 pg (ref 26.0–34.0)
MCHC: 30.5 g/dL (ref 30.0–36.0)
MCV: 101.6 fL — ABNORMAL HIGH (ref 80.0–100.0)
Platelets: 274 10*3/uL (ref 150–400)
RBC: 3.16 MIL/uL — ABNORMAL LOW (ref 3.87–5.11)
RDW: 19.6 % — ABNORMAL HIGH (ref 11.5–15.5)
WBC: 5.1 10*3/uL (ref 4.0–10.5)
nRBC: 0 % (ref 0.0–0.2)

## 2023-02-12 LAB — GLUCOSE, CAPILLARY
Glucose-Capillary: 118 mg/dL — ABNORMAL HIGH (ref 70–99)
Glucose-Capillary: 148 mg/dL — ABNORMAL HIGH (ref 70–99)
Glucose-Capillary: 152 mg/dL — ABNORMAL HIGH (ref 70–99)
Glucose-Capillary: 161 mg/dL — ABNORMAL HIGH (ref 70–99)

## 2023-02-12 LAB — COOXEMETRY PANEL
Carboxyhemoglobin: 2.8 % — ABNORMAL HIGH (ref 0.5–1.5)
Methemoglobin: 0.8 % (ref 0.0–1.5)
O2 Saturation: 63.1 %
Total hemoglobin: 9.9 g/dL — ABNORMAL LOW (ref 12.0–16.0)

## 2023-02-12 LAB — C-REACTIVE PROTEIN: CRP: 0.9 mg/dL (ref <1.0)

## 2023-02-12 LAB — MAGNESIUM: Magnesium: 2 mg/dL (ref 1.7–2.4)

## 2023-02-12 NOTE — Progress Notes (Signed)
  Progress Note   Patient: Natasha Woodard [id number] DOB: [birth year] DOA: 01/10/2023     32 DOS: the patient was seen and examined on 02/12/2023   Brief hospital course:  Assessment and Plan: Acute lateral wall MI  -  Distal occlusion of the circumflex marginal vessel complicated by severe ischemic MR and acute systolic CHF treated with DES - Eliquis 2.5 mg PO bid  - Plavix 75 mg PO daily  - Crestor 10 mg PO daily    Cardiogenic shock Severe ischemic MR and acute systolic CHF - Torsemide 20 mg PO daily  - Aldactone 12.5 mg PO daily  - TEE 2/7 confirmed severe MR - ICD placement 2/12   Paroxysmal atrial fibrillation VT arrest 2/7 - Amiodarone 200 mg PO bid  - Eliquis 2.5 mg PO bid    Right femoral pseudoaneurysm Right groin wound, secondary infection Patient developed pseudoaneurysm following balloon pump, s/p thrombin injection by Dr. Carlis Abbott 1/31.  2/4 stat u/s showed recurrent PSA with active bleeding into PSA. S/p PSA repair with VAC placement.  2/15 VAC change with sartorious muscle flap.  2/20, went to the OR for washout and wound VAC placement. 2/27, underwent wound VAC change in OR.  Tentative plan of wound VAC change at bedside on 3/1. Wound culture showed polymicrobial growth showing moderate Morganella and Enterococcus with few  pseudomonas.   - IV zosyn 3.375 g q8hr  - Tramadol 50 mg PO q12 PRN  - Oxycodone 5 mg PO q6 hr PRN    AKI on CKD stage 3b - Monitor    Chronic anemia Severe iron deficiency - Monitor    Impaired hearing - Complicating care    DVT prophylaxis: Eliquis as above          Subjective: Pt seen and examined at the bedside. Labs stable and CRP downtrending.  She continues on IV antibx with wound vac draining serosanguineous fluid.  Physical Exam: Vitals:   02/11/23 2009 02/12/23 0400 02/12/23 0705 02/12/23 0812  BP:  122/65  130/69  Pulse: 61   71  Resp: '18 18  18  '$ Temp: 98.5 F (36.9 C) 98 F (36.7 C)  98.2 F (36.8 C)   TempSrc: Oral Oral  Axillary  SpO2: 96%   99%  Weight:   67 kg   Height:       Constitutional:      Appearance: She is well-developed.  HENT:     Head: Normocephalic.  Cardiovascular:     Rate and Rhythm: Normal rate and regular rhythm.  Pulmonary:     Effort: Pulmonary effort is normal.  Abdominal:     Palpations: Abdomen is soft.  Skin:    General: Skin is warm.  Neurological:     Mental Status: She is alert.     Comments: At baseline, hard of hearing   Psychiatric:        Mood and Affect: Mood normal.   Data Reviewed:   Disposition: Status is: Inpatient  Planned Discharge Destination: Barriers to discharge: Groin wound healing     Time spent: 35 minutes  Author: Lucienne Minks , MD 02/12/2023 11:25 AM  For on call review [email or web address].

## 2023-02-12 NOTE — Progress Notes (Signed)
Mobility Specialist Progress Note:   02/12/23 1531  Mobility  Activity Ambulated with assistance in hallway  Level of Assistance Modified independent, requires aide device or extra time  Assistive Device Front wheel walker  Distance Ambulated (ft) 300 ft  Activity Response Tolerated well  $Mobility charge 1 Mobility   Pt in chair asking to walk. No complaints of pain. Left in chair with call bell in reach and all needs met.    Natasha Woodard Mobility Specialist Please contact via [facility] or  Rehab Office at [phone]

## 2023-02-12 NOTE — Progress Notes (Signed)
Mobility Specialist Progress Note:   02/12/23 0848  Mobility  Activity Ambulated with assistance in hallway  Level of Assistance Standby assist, set-up cues, supervision of patient - no hands on  Assistive Device Front wheel walker  Distance Ambulated (ft) 260 ft  Activity Response Tolerated well  $Mobility charge 1 Mobility   Pt in chair willing to participate in mobility. No complaints of pain. Left in chair with call bell in reach and all needs met.   Gareth Eagle Coralee Edberg Mobility Specialist Please contact via [facility] or  Rehab Office at [phone]

## 2023-02-13 DIAGNOSIS — I214 Non-ST elevation (NSTEMI) myocardial infarction: Secondary | ICD-10-CM | POA: Diagnosis not present

## 2023-02-13 DIAGNOSIS — I480 Paroxysmal atrial fibrillation: Secondary | ICD-10-CM | POA: Diagnosis not present

## 2023-02-13 DIAGNOSIS — I724 Aneurysm of artery of lower extremity: Secondary | ICD-10-CM | POA: Diagnosis not present

## 2023-02-13 LAB — COOXEMETRY PANEL
Carboxyhemoglobin: 2.1 % — ABNORMAL HIGH (ref 0.5–1.5)
Methemoglobin: <0.7 % (ref 0.0–1.5)
O2 Saturation: 59.9 %
Total hemoglobin: 9.8 g/dL — ABNORMAL LOW (ref 12.0–16.0)

## 2023-02-13 LAB — BASIC METABOLIC PANEL WITH GFR
Anion gap: 5 (ref 5–15)
BUN: 42 mg/dL — ABNORMAL HIGH (ref 8–23)
CO2: 31 mmol/L (ref 22–32)
Calcium: 9.3 mg/dL (ref 8.9–10.3)
Chloride: 103 mmol/L (ref 98–111)
Creatinine, Ser: 1.61 mg/dL — ABNORMAL HIGH (ref 0.44–1.00)
GFR, Estimated: 32 mL/min — ABNORMAL LOW
Glucose, Bld: 112 mg/dL — ABNORMAL HIGH (ref 70–99)
Potassium: 3.9 mmol/L (ref 3.5–5.1)
Sodium: 139 mmol/L (ref 135–145)

## 2023-02-13 LAB — GLUCOSE, CAPILLARY
Glucose-Capillary: 112 mg/dL — ABNORMAL HIGH (ref 70–99)
Glucose-Capillary: 128 mg/dL — ABNORMAL HIGH (ref 70–99)
Glucose-Capillary: 126 mg/dL — ABNORMAL HIGH (ref 70–99)
Glucose-Capillary: 137 mg/dL — ABNORMAL HIGH (ref 70–99)

## 2023-02-13 LAB — C-REACTIVE PROTEIN: CRP: 0.7 mg/dL (ref <1.0)

## 2023-02-13 LAB — CBC
HCT: 32.1 % — ABNORMAL LOW (ref 36.0–46.0)
Hemoglobin: 9.4 g/dL — ABNORMAL LOW (ref 12.0–15.0)
MCH: 30.4 pg (ref 26.0–34.0)
MCHC: 29.3 g/dL — ABNORMAL LOW (ref 30.0–36.0)
MCV: 103.9 fL — ABNORMAL HIGH (ref 80.0–100.0)
Platelets: 270 10*3/uL (ref 150–400)
RBC: 3.09 MIL/uL — ABNORMAL LOW (ref 3.87–5.11)
RDW: 19.8 % — ABNORMAL HIGH (ref 11.5–15.5)
WBC: 5.5 10*3/uL (ref 4.0–10.5)
nRBC: 0 % (ref 0.0–0.2)

## 2023-02-13 LAB — MAGNESIUM: Magnesium: 2 mg/dL (ref 1.7–2.4)

## 2023-02-13 NOTE — Progress Notes (Signed)
Mobility Specialist Progress Note:   02/13/23 0851  Mobility  Activity Ambulated with assistance in hallway  Level of Assistance Modified independent, requires aide device or extra time  Assistive Device Front wheel walker  Distance Ambulated (ft) 330 ft  Activity Response Tolerated well  $Mobility charge 1 Mobility   Pt in chair willing to participate in mobility. No complaints of pain. Left in chair with call bell in reach and all needs met.   Gareth Eagle Sergi Gellner Mobility Specialist Please contact via [facility] or  Rehab Office at [phone]

## 2023-02-13 NOTE — Progress Notes (Addendum)
Attempted cerumen removal per order with bulb aspirator provided with debrox. Unable to remove anything substantial, however pt states she can feel me hitting what she believes to be a blockage of dried skin. Will attempt to visualize with otoscope on unit. Pt states she sees a hearing specialist o/p who typically uses tweezers with a scope to remove dead skin buildup. Relayed to MD who states pt will need o/p f/u.  Upon visualization, tympanic membranes are cloudy bilaterally. No significant cerumen, foreign body, or obstruction appreciated.

## 2023-02-13 NOTE — Progress Notes (Signed)
Physical Therapy Treatment Patient Details Name: Natasha Woodard MRN: [id number] DOB: March 18, 1941 Today's Date: 02/13/2023   History of Present Illness 82 yo female admitted 1/30 with chest pain, NSTEMI s/p cardiac cath and IABP placed. Pt with bleeding and impaired circulation with IABP removed 1/31 and thrombin injection to pseudoaneurysm. 2/4 Rt groin pseudoaneurysm rupture s/p repair with placement of wound VAC. Pt with VT arrest on 2/8. ICD placed 2/12. Pt with rt groin washout and debridement and sartorius muscle flap with VAC placement on 2/15. PMhx: CAD, dilated cardiomyopathy, CKD, GERD, HFpEF, HTN, HLD, T2DM.    PT Comments    Pt received in recliner with feet down, pt agreeable to therapy session and with good participation and tolerance for reciprocal transfer and stair training. Pt reports 17 steps to get up to bedroom, and says she only needs to perform ~2-3 steps to get to her first floor where she could sleep temporarily, but she prefers to stay upstairs if able. Pt performed short distance gait trial in room as well but fatigued quickly and requested PTA return later for gait trial if time. Pt continues to benefit from PT services to progress toward functional mobility goals.   Recommendations for follow up therapy are one component of a multi-disciplinary discharge planning process, led by the attending physician.  Recommendations may be updated based on patient status, additional functional criteria and insurance authorization.  Follow Up Recommendations  Acute inpatient rehab (3hours/day)     Assistance Recommended at Discharge Frequent or constant Supervision/Assistance  Patient can return home with the following A little help with walking and/or transfers;A little help with bathing/dressing/bathroom;Help with stairs or ramp for entrance;Assist for transportation;Assistance with cooking/housework   Equipment Recommendations  Rolling walker (2 wheels)    Recommendations for  Other Services       Precautions / Restrictions Precautions Precautions: Fall;ICD/Pacemaker;Other (comment) (LUE ICD) Precaution Comments: wound VAC rt groin, LUE ICD placed 2/12 no lifting LUE past shoulder or L shld resistance exercise x6 weeks Restrictions Weight Bearing Restrictions: No     Mobility  Bed Mobility               General bed mobility comments: pt received/remained in recliner.    Transfers Overall transfer level: Needs assistance Equipment used: Rolling walker (2 wheels) Transfers: Sit to/from Stand Sit to Stand: Min assist           General transfer comment: from lower toilet seat to RW she needs minA lift assist; min guard from recliner with use of arm rests. Poor safety recall for BUE placement    Ambulation/Gait Ambulation/Gait assistance: Min guard Gait Distance (Feet): 25 Feet (x2 with seated break) Assistive device: Rolling walker (2 wheels) Gait Pattern/deviations: Step-through pattern, Trunk flexed, Drifts right/left, Decreased stride length       General Gait Details: min guard for safety and line assist, pt at times bumping her RW into objects in room, possibly visual deficit. to/from bathroom, pt requesting rest break afterward due to fatigue from stair training.   Stairs Stairs: Yes Stairs assistance: Min guard Stair Management: Step to pattern, Forwards, With walker Number of Stairs: 10 (single 7" step x10 reps) General stair comments: single 7" platform step in room x10 reps in a row with BUE support to simulate B railings   Wheelchair Mobility    Modified Rankin (Stroke Patients Only)       Balance Overall balance assessment: Needs assistance Sitting-balance support: Feet supported Sitting balance-Leahy Scale: Fair Sitting balance -  Comments: no UE support with static sitting   Standing balance support: Bilateral upper extremity supported, No upper extremity supported, Reliant on assistive device for  balance Standing balance-Leahy Scale: Fair Standing balance comment: pt able to stand at sink and wash hands mostly unsupported, pt reliant on RW for dynamic standing tasks (gait)                            Cognition Arousal/Alertness: Awake/alert Behavior During Therapy: WFL for tasks assessed/performed Overall Cognitive Status: Within Functional Limits for tasks assessed                                 General Comments: HoH, increased time to process and respond to cues. Spouse left room during therapy session.        Exercises Other Exercises Other Exercises: PTA reviewed movement restrictions which are typically 6 wks post-op. No lifting LUE arm over her shoulder height, as pt was asking about UE exercises. Also reviewed difference in roles between PT/OT and mobility specialists and written on board for pt/family to reinforce. Other Exercises: step-ups and step-downs on 7" platform for BLE strengthening (leading with LLE to step up) x10 reps Other Exercises: STS x 3 reps reciprocal using hands to push up (also stood x2 other times from varying height surfaces) for BLE strengthening    General Comments General comments (skin integrity, edema, etc.): SpO2 poor signal due to fake nails and water over sensor, HR 70's bpm resting and seen as high as 96 bpm with exertion. SpO2 WFL on RA when good signal achieved intermittently.      Pertinent Vitals/Pain Pain Assessment Pain Assessment: Faces Faces Pain Scale: Hurts a little bit Pain Location: R groin, generalized Pain Descriptors / Indicators: Sore Pain Intervention(s): Monitored during session, Repositioned     PT Goals (current goals can now be found in the care plan section) Acute Rehab PT Goals Patient Stated Goal: to get stronger at therapy prior to home PT Goal Formulation: With patient Time For Goal Achievement: 02/23/23 Progress towards PT goals: Progressing toward goals    Frequency    Min  3X/week      PT Plan Current plan remains appropriate       AM-PAC PT "6 Clicks" Mobility   Outcome Measure  Help needed turning from your back to your side while in a flat bed without using bedrails?: A Little Help needed moving from lying on your back to sitting on the side of a flat bed without using bedrails?: A Little Help needed moving to and from a bed to a chair (including a wheelchair)?: A Little Help needed standing up from a chair using your arms (e.g., wheelchair or bedside chair)?: A Little Help needed to walk in hospital room?: A Little Help needed climbing 3-5 steps with a railing? : A Lot (mod safety cues) 6 Click Score: 17    End of Session Equipment Utilized During Treatment: Gait belt Activity Tolerance: Patient tolerated treatment well Patient left: with call bell/phone within reach;in chair;with chair alarm set;with nursing/sitter in room (RN entering the room to give meds.) Nurse Communication: Mobility status PT Visit Diagnosis: Unsteadiness on feet (R26.81);Other abnormalities of gait and mobility (R26.89);Muscle weakness (generalized) (M62.81)     Time: [id number] PT Time Calculation (min) (ACUTE ONLY): 17 min  Charges:  $Therapeutic Exercise: 8-22 mins  Houston Siren., PTA Acute Rehabilitation Services Secure Chat Preferred 9a-5:30pm Office: [place] 02/13/2023, 2:15 PM

## 2023-02-13 NOTE — Progress Notes (Signed)
Patient ID: Natasha Woodard, female   DOB: 1941/05/23, 82 y.o.   MRN: [id number]     Advanced Heart Failure Rounding Note  PCP-Cardiologist: Dr. Ellyn Hack AHF: Dr. 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 Now admitted for acute lateral wall MI 2/2 distal LCx occlusion c/b severe ischemic MR and acute systolic heart failure w/ low output, CI 2.1 on RHC. Required placement of IABP, but later removed given concerns for developing limb ischemia=>found to have Right femoral PSA then ultimate rupture s/p emergent repair. VT arrest 2/8. ICD placed 2/12.  Subjective:   1/31 Underwent thrombin injection to right femoral PSA . Hgb drifting down 10.3>8.3>6.8  2/1 Started on amio drip to suppress PVCs. Got dose of IV lasix x1. CVP low.  2/4 Ruptured PSA. Returned to OR for repair. VAC in place 2/5 Midodrine 5 mg three times daily added.  Back in A fib.  2/7 TEE with mod MR.  2/8 VT arrest--> shock x1 ROSC after 2 minutes. Given lidocaine and amio was increased to 60 mg.  2/9 Transvenous pacemaker placed for overdrive pacing with PMVT.  2/11 Milrinone restarted, Co-ox 49% 2/12 PICC removed. S/p [employer] Sci ICD  2/14 Milrinone stopped . Amio drip stopped.  2/15 Returned to OR for washout and VAC change with sartorious muscle flap.  2/20 OR for washout and wound vac placement.   2/26 OR groin washout, debridement and vac change   Milrinone stopped 2/28. Co-ox has been stable.    Weight stable, restarted PO diuretics 3/2.   Up in chair, son at bedside. Feels great, concerned about her ear. Denies CP/SOB.   Objective:   Weight Range: 67 kg Body mass index is 27.02 kg/m.   Vital Signs:   Temp:  [97.8 F (36.6 C)-98.5 F (36.9 C)] 98.4 F (36.9 C) (03/04 0829) Pulse Rate:  [70-72] 72 (03/04 0829) Resp:  [18] 18 (03/04 0829) BP: (107-127)/(56-68) 122/63 (03/04 0829) SpO2:  [97 %-100 %] 100 % (03/04  0829) Last BM Date : 02/10/23  Weight change: Filed Weights   02/10/23 0630 02/11/23 0705 02/12/23 0705  Weight: 67.6 kg 67.4 kg 67 kg    Intake/Output:   Intake/Output Summary (Last 24 hours) at 02/13/2023 0920 Last data filed at 02/12/2023 1320 Gross per 24 hour  Intake 180 ml  Output --  Net 180 ml    PHYSICAL EXAM: CVP 11 General:  chronically ill appearing.  No respiratory difficulty HEENT: normal Neck: supple. JVD ~10 cm. Carotids 2+ bilat; no bruits. No lymphadenopathy or thyromegaly appreciated. Cor: PMI nondisplaced. Regular rate & rhythm. No rubs, gallops or murmurs. Lungs: clear Abdomen: soft, nontender, nondistended. No hepatosplenomegaly. No bruits or masses. Good bowel sounds. Extremities: no cyanosis, clubbing, rash, non-pitting RLE edema. Wound vac to R groin. PICC RUE Neuro: alert & oriented x 3, cranial nerves grossly intact. moves all 4 extremities w/o difficulty. Affect pleasant.   Telemetry   A Paced 70s (Personally reviewed)    Labs    CBC Recent Labs    02/12/23 0507 02/13/23 0451  WBC 5.1 5.5  HGB 9.8* 9.4*  HCT 32.1* 32.1*  MCV 101.6* 103.9*  PLT 274 [id number]   Basic Metabolic Panel Recent Labs    02/12/23 0507 02/13/23 0451  NA 140 139  K 4.1 3.9  CL 102 103  CO2 28 31  GLUCOSE 114* 112*  BUN 45* 42*  CREATININE 1.31*  1.61*  CALCIUM 9.2 9.3  MG 2.0 2.0   BNP: BNP (last 3 results) Recent Labs    08/31/22 1613 01/10/23 1048 02/01/23 1239  BNP 1,178.5* >4,500.0* 914.7*   Imaging   No results found.  Medications:     Scheduled Medications:  acetaminophen  650 mg Oral Q6H   amiodarone  200 mg Oral BID   apixaban  2.5 mg Oral BID   Chlorhexidine Gluconate Cloth  6 each Topical Daily   clopidogrel  75 mg Oral Daily   feeding supplement  237 mL Oral TID BM   fentaNYL (SUBLIMAZE) injection  50 mcg Intravenous Once   insulin aspart  0-15 Units Subcutaneous TID WC   lidocaine  1 patch Transdermal Q24H   lidocaine  1 patch  Transdermal Q24H   losartan  12.5 mg Oral Daily   multivitamin with minerals  1 tablet Oral Daily   nutrition supplement (JUVEN)  1 packet Oral BID BM   rosuvastatin  10 mg Oral QHS   sodium chloride flush  3 mL Intravenous Q12H   spironolactone  12.5 mg Oral Daily   torsemide  20 mg Oral Daily   traMADol  50 mg Oral Q12H    Infusions:  sodium chloride 250 mL (01/23/23 1235)   piperacillin-tazobactam (ZOSYN)  IV 3.375 g (02/13/23 0502)   PRN Medications: sodium chloride, acetaminophen, alum & mag hydroxide-simeth, hydrocortisone cream, ipratropium-albuterol, melatonin, nitroGLYCERIN, ondansetron (ZOFRAN) IV, mouth rinse, oxyCODONE, simethicone, sodium chloride flush  Assessment/Plan  1. Cardiogenic shock/acute on chronic systolic CHF: Echo this admission with EF 20-25%, mild LV dilation, mild RV dysfunction, severe MR with restricted posterior leaflet.  Echo in 2/23 with EF 50-55%.  Patient has history of nonischemic cardiomyopathy (?PVC-mediated) that improved.  She reports 2 months of dyspnea then had lateral STEMI 1/30 with chest pain.  PLOM occlusion not thought to explain the extent of her cardiomyopathy, so suspect EF may have been falling prior to this event (symptomatic at least 2 months).  Suspect mixed ischemic/nonischemic cardiomyopathy. She initially had IABP for cardiogenic shock, now removed due to right leg ischemia. Was weaned off pressors/inotropes but Milrinone restarted 2/23 with volume overload and poor diuresis.   - Volume status improved.  Euvolemic on exam. Renal fx ok - Milrinone stopped 2/28. Co-ox stable at 60%. CVP 11 - Continue spironolactone 12.5 daily.   - Continue torsemide at 20 mg daily - Previously on midodrine, BPs have been stable since the 14th. Now on losartan 12.5 mg daily, tolerating fine. Continue  - Hold off on SGLT2i given groin infection.  - additional GDMT initially limited by hypotension and AKI, now off midodrine.  - S/p  DDD ICD.  - Strict  I&O, daily weights 2. Mitral regurgitation: severe MR with restricted posterior leaflet on initial echo, suspect infarct-related MR with PLOM occlusion. TEE was later done showing moderate MR.  3. CAD: Lateral STEMI with occluded pLOM, treated with DES.   - Off ASA given apixaban use.  - Switched from Brilinta to plavix.  - Continue statin.  4. 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  - Continue amio 200 mg twice a day.   5. Right femoral PSA: s/p thrombin injection by Dr. Carlis Abbott. 2/4 stat u/s with recurrent PSA with active bleeding into PSA. S/p PSA repair with VAC placement. 2/15 VAC change with sartorious muscle flap.  Vascular following. Went to OR 2/26 for washout and wound vac placement.   - She is on Zosyn  for wound infection.  - Wound vac changed at bedside yesterday 2/29. Plan for further wound vac changed by WOC. Ok to go to [employer] from vas standpoint. 6. AKI on CKD stage 3b: Baseline 1.3-1.5.  Creatinine trended up to 2.06. Milrinone restarted for low output. SCr 1.6 today, will continue to monitor. Afraid she may going towards low-output again.  7.  Acute blood loss anemia:  Hgb stable.  8. Atrial fibrillation: Paroxysmal. Maintaining SR.  - Amiodarone 200 mg bid  - Continue apixaban 2.5 bid.  9. VT Arrest: 2/7, DCCV x 1 with CPR, ROSC after 2 minutes.  Polymorphic VT, appeared bradycardia-mediated with intermittent junctional rhythm. - s/p DDD ICD on 2/12 - Continue amiodarone.  Disposition: Plan for CIR however husband adamant she's not going anywhere until wound heels. Pt would benefit greatly from CIR. Continue to mobilize.   Earnie Larsson AGACNP-BC  02/13/2023

## 2023-02-13 NOTE — Progress Notes (Signed)
Physical Therapy Treatment Patient Details Name: Natasha Woodard MRN: [id number] DOB: [birth year] Today's Date: 02/13/2023   History of Present Illness 82 yo female admitted 1/30 with chest pain, NSTEMI s/p cardiac cath and IABP placed. Pt with bleeding and impaired circulation with IABP removed 1/31 and thrombin injection to pseudoaneurysm. 2/4 Rt groin pseudoaneurysm rupture s/p repair with placement of wound VAC. Pt with VT arrest on 2/8. ICD placed 2/12. Pt with rt groin washout and debridement and sartorius muscle flap with VAC placement on 2/15. PMhx: CAD, dilated cardiomyopathy, CKD, GERD, HFpEF, HTN, HLD, T2DM, Ichythyosis.    PT Comments    PTA called back to room to assist pt with longer distance gait trial in hallway per pt request. Pt reports moderate fatigue 5/10 modified RPE at beginning of session and 7/10 modified RPE at end of session. Pt needing up to minA for transfers and mostly min guard for gait with RW, but required mod safety cues and prolonged standing break (leaning against wall) ~3 mins halfway through. Pt continues to benefit from PT services to progress toward functional mobility goals.   Recommendations for follow up therapy are one component of a multi-disciplinary discharge planning process, led by the attending physician.  Recommendations may be updated based on patient status, additional functional criteria and insurance authorization.  Follow Up Recommendations  Acute inpatient rehab (3hours/day)     Assistance Recommended at Discharge Frequent or constant Supervision/Assistance  Patient can return home with the following A little help with walking and/or transfers;A little help with bathing/dressing/bathroom;Help with stairs or ramp for entrance;Assist for transportation;Assistance with cooking/housework   Equipment Recommendations  Rolling walker (2 wheels)    Recommendations for Other Services       Precautions / Restrictions Precautions Precautions:  Fall;ICD/Pacemaker;Other (comment) (LUE ICD) Precaution Comments: wound VAC rt groin, LUE ICD placed 2/12 no lifting LUE past shoulder or L shld resistance exercise x6 weeks Restrictions Weight Bearing Restrictions: No     Mobility  Bed Mobility               General bed mobility comments: pt received/remained in recliner.    Transfers Overall transfer level: Needs assistance Equipment used: Rolling walker (2 wheels) Transfers: Sit to/from Stand Sit to Stand: Min assist           General transfer comment: Mod cues for improved technique with stand>sit as pt tends to plop down without reaching back when fatigued, mn guard to rise but minA for lowering assist.    Ambulation/Gait Ambulation/Gait assistance: Min guard Gait Distance (Feet): 100 Feet (x2 with prolonged rest break leaning back against wall) Assistive device: Rolling walker (2 wheels) Gait Pattern/deviations: Step-through pattern, Trunk flexed, Drifts right/left, Decreased stride length Gait velocity: decreased     General Gait Details: min guard for safety and line assist, pt at times bumping her RW into objects in room, possibly visual deficit. x3 standing breaks in hallway with one rest pt leaning back against the wall.   Stairs Stairs: Yes Stairs assistance: Min guard Stair Management: Step to pattern, Forwards, With walker Number of Stairs: 10 (single 7" step x10 reps) General stair comments: single 7" platform step in room x10 reps in a row with BUE support to simulate B railings   Wheelchair Mobility    Modified Rankin (Stroke Patients Only)       Balance Overall balance assessment: Needs assistance Sitting-balance support: Feet supported Sitting balance-Leahy Scale: Fair Sitting balance - Comments: no UE support with static sitting  Standing balance support: Bilateral upper extremity supported, No upper extremity supported, Reliant on assistive device for balance Standing balance-Leahy  Scale: Fair Standing balance comment: static standing briefly fair, poor dynamic standing balance without AD.                            Cognition Arousal/Alertness: Awake/alert Behavior During Therapy: WFL for tasks assessed/performed Overall Cognitive Status: Within Functional Limits for tasks assessed                                 General Comments: HoH, increased time to process and respond to cues. Poor carryover of instruction on roles of PTA/OT/Mobility from previous sessions.        Exercises Other Exercises Other Exercises: PTA reviewed movement restrictions which are typically 6 wks post-op. No lifting LUE arm over her shoulder height, as pt was asking about UE exercises. Also reviewed difference in roles between PT/OT and mobility specialists and written on board for pt/family to reinforce. Other Exercises: step-ups and step-downs on 7" platform for BLE strengthening (leading with LLE to step up) x10 reps Other Exercises: STS x 3 reps reciprocal using hands to push up (also stood x2 other times from varying height surfaces) for BLE strengthening    General Comments General comments (skin integrity, edema, etc.): SpO2 poor signal due to fake nails and water over sensor, HR 70's bpm resting and seen as high as 96 bpm with exertion. SpO2 WFL on RA when good signal achieved intermittently.      Pertinent Vitals/Pain Pain Assessment Pain Assessment: Faces Faces Pain Scale: Hurts little more Pain Location: R groin, low back pain Pain Descriptors / Indicators: Sore, Discomfort Pain Intervention(s): Monitored during session, Repositioned     PT Goals (current goals can now be found in the care plan section) Acute Rehab PT Goals Patient Stated Goal: to get stronger at therapy prior to home PT Goal Formulation: With patient Time For Goal Achievement: 02/23/23 Progress towards PT goals: Progressing toward goals    Frequency    Min 3X/week       PT Plan Current plan remains appropriate       AM-PAC PT "6 Clicks" Mobility   Outcome Measure  Help needed turning from your back to your side while in a flat bed without using bedrails?: A Little Help needed moving from lying on your back to sitting on the side of a flat bed without using bedrails?: A Little Help needed moving to and from a bed to a chair (including a wheelchair)?: A Little Help needed standing up from a chair using your arms (e.g., wheelchair or bedside chair)?: A Little Help needed to walk in hospital room?: A Little Help needed climbing 3-5 steps with a railing? : A Lot (mod safety cues) 6 Click Score: 17    End of Session Equipment Utilized During Treatment: Gait belt Activity Tolerance: Patient tolerated treatment well Patient left: with call bell/phone within reach;in chair;with chair alarm set Nurse Communication: Mobility status PT Visit Diagnosis: Unsteadiness on feet (R26.81);Other abnormalities of gait and mobility (R26.89);Muscle weakness (generalized) (M62.81)     Time: [id number] PT Time Calculation (min) (ACUTE ONLY): 17 min  Charges:  $Gait Training: 8-22 mins                     Alizeh Madril P., PTA Acute Rehabilitation Services Secure Chat Preferred  9a-5:30pm Office: [place] 02/13/2023, 2:27 PM

## 2023-02-13 NOTE — Hospital Course (Addendum)
82 year old woman presented with chest pain.  Found to have acute lateral wall MI secondary to distal left circumflex occlusion by cardiac catheterization, complicated by severe ischemic MR and acute systolic heart failure with low output.  Required placement of IABP which was later removed secondary to concern for limb ischemia.  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  Subsequently suffered a ventricular tachycardia arrest, ICD was placed, several trips to the OR for groin washout, debridement and wound VAC placement.  1/31 Underwent thrombin injection to right femoral PSA . Hgb drifting down 10.3>8.3>6.8  2/1 Started on amio drip to suppress PVCs. Got dose of IV lasix x1. CVP low.  2/4 Ruptured PSA. Returned to OR for repair. VAC in place 2/5 Midodrine 5 mg three times daily added.  Back in A fib.  2/7 TEE with mod MR.  2/8 VT arrest--> shock x1 ROSC after 2 minutes. Given lidocaine and amio was increased to 60 mg.  2/9 Transvenous pacemaker placed for overdrive pacing with PMVT.  2/11 Milrinone restarted, Co-ox 49% 2/12 PICC removed. S/p [employer] Sci ICD  2/14 Milrinone stopped . Amio drip stopped.  2/15 Returned to OR for washout and VAC change with sartorious muscle flap.  2/20 OR for washout and wound vac placement.   2/26 OR groin washout, debridement and vac change  3/5 ready for CIR 03/06 peer to peer call with insurance medical director, and CIR has been declined. Apparently patient's functional and cognitive function is too good to qualify for CIR level of services.  03/09 patient medically stable for CIR, added antifungal for vaginal yeast infection.  03/10 Patient is being transfer to CIR.

## 2023-02-13 NOTE — Consult Note (Addendum)
[place] Nurse Consult Note: R groin washout and debridement 2/26, vascular changed first NPWT dressing 2/29, WOC consult for future NPWT dressing changes Mon/Thurs Reason for Consult: NPWT  Wound type: surgical  Pressure Injury POA: NA, not pressure  Measurement: 4 cms x 8 cms x 2 cms  Wound bed: 80% red moist 20% yellow fibrinous tissue  Drainage (amount, consistency, odor)  minimal serosanguinous  Periwound: intact  Dressing procedure/placement/frequency: Removed old NPWT dressing Cleansed wound with normal saline Periwound skin protected with skin barrier wipe; did utilize 2" barrier ring that was cut in two and flattened out for creases to left and right of wound   Filled wound with  _1 piece white foam and 1 piece black foam   Sealed NPWT dressing at 152m HG  Patient deferred any pain medication prior to dressing change; tolerated well   WOC nurse will continue to provide NPWT dressing change MON/THURS per vascular  due to the complexity of the dressing change.    Next NPWT dressing change THURS 02/16/2023.   Thank you,    Toluwani Ruder MSN, RN-BC, [employer]

## 2023-02-13 NOTE — Progress Notes (Signed)
Progress Note   Patient: Natasha Woodard [id number] DOB: 02/23/1941 DOA: 01/10/2023     33 DOS: the patient was seen and examined on 02/13/2023   Brief hospital course: 82 year old woman presented with chest pain.  Found to have acute lateral wall MI secondary to distal left circumflex occlusion by cardiac catheterization, complicated by severe ischemic MR and acute systolic heart failure with low output.  Required placement of IABP which was later removed secondary to concern for limb ischemia.  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  Subsequently suffered a ventricular tachycardia arrest, ICD was placed, several trips to the OR for groin washout, debridement and wound VAC placement.  Assessment and Plan: Acute lateral wall MI  Cardiogenic shock Acute on chronic systolic CHF Severe ischemic MR Status post ICD placement 2/12 Continue management per advanced heart failure   Paroxysmal atrial fibrillation VT arrest 2/7 S/p DCCV x 1 with CPR, ROSC after 2 minutes.  Polymorphic VT, appeared bradycardia-mediated with intermittent junctional rhythm. S/p DDD ICD on 2/12 Continue amiodarone   Right femoral pseudoaneurysm Right groin wound, secondary infection Patient developed pseudoaneurysm following balloon pump, s/p thrombin injection by Dr. Carlis Abbott 1/31.  2/4 stat u/s showed recurrent PSA with active bleeding into PSA. S/p PSA repair with VAC placement.  2/15 VAC change with sartorious muscle flap.  2/20, went to the OR for washout and wound VAC placement. 2/27, underwent wound VAC change in OR.  Tentative plan of wound VAC change at bedside on 3/1. Wound culture showed polymicrobial growth showing moderate Morganella and Enterococcus with few  pseudomonas.   Continue IV Zosyn per vascular surgery Okay for discharge to CIR from vascular standpoint.  Wound VAC changes Mondays and Thursdays starting 3/4 per [facility] RN.   AKI on  CKD stage 3b Baseline creatinine 1.3-1.5.  Creatinine peaked at 2.06 on 2/13.  Improved but has been somewhat labile and is up today to 1.61.  Heart failure team monitoring for low output state.  Chronic anemia Severe iron deficiency Secondary to blood loss and iron deficiency given IV iron 2/18 Hemoglobin stay well.  Cerumen impaction Usually addressed by outpatient specialist with scope and tweezers.  Inpatient attempts failed.  Follow-up as an outpatient.       Subjective:  Feels ok today Some difficulty hearing from right ear secondary to wax buildup  Physical Exam: Vitals:   02/12/23 2332 02/13/23 0436 02/13/23 0829 02/13/23 1116  BP: 119/63 127/68 122/63 116/63  Pulse: 70 71 72 65  Resp: '18 18 18   '$ Temp: 98 F (36.7 C) 97.8 F (36.6 C) 98.4 F (36.9 C) 98.4 F (36.9 C)  TempSrc: Oral Oral Oral Oral  SpO2: 99% 97% 100% 99%  Weight:      Height:       Physical Exam Vitals reviewed.  Constitutional:      General: She is not in acute distress.    Appearance: She is not ill-appearing or toxic-appearing.  Cardiovascular:     Rate and Rhythm: Normal rate and regular rhythm.     Heart sounds: No murmur heard. Pulmonary:     Effort: Pulmonary effort is normal. No respiratory distress.     Breath sounds: No wheezing, rhonchi or rales.  Neurological:     Mental Status: She is alert.  Psychiatric:        Behavior: Behavior normal.     Data Reviewed: CBG stable Creatinine up to 1.61 Hgb stable 9.4  Family Communication: husband,  son at bedside  Disposition: Status is: Inpatient Remains inpatient appropriate because: followed by AHF  Planned Discharge Destination:  CIR    Time spent: 35 minutes  Author: Murray Hodgkins, MD 02/13/2023 1:52 PM  For on call review [email or web address].

## 2023-02-14 DIAGNOSIS — R570 Cardiogenic shock: Secondary | ICD-10-CM | POA: Insufficient documentation

## 2023-02-14 DIAGNOSIS — I214 Non-ST elevation (NSTEMI) myocardial infarction: Secondary | ICD-10-CM | POA: Diagnosis not present

## 2023-02-14 DIAGNOSIS — R5381 Other malaise: Secondary | ICD-10-CM

## 2023-02-14 DIAGNOSIS — I480 Paroxysmal atrial fibrillation: Secondary | ICD-10-CM | POA: Insufficient documentation

## 2023-02-14 DIAGNOSIS — I724 Aneurysm of artery of lower extremity: Secondary | ICD-10-CM | POA: Insufficient documentation

## 2023-02-14 DIAGNOSIS — I5023 Acute on chronic systolic (congestive) heart failure: Secondary | ICD-10-CM | POA: Insufficient documentation

## 2023-02-14 DIAGNOSIS — N183 Chronic kidney disease, stage 3 unspecified: Secondary | ICD-10-CM | POA: Insufficient documentation

## 2023-02-14 DIAGNOSIS — I5022 Chronic systolic (congestive) heart failure: Secondary | ICD-10-CM | POA: Insufficient documentation

## 2023-02-14 DIAGNOSIS — N179 Acute kidney failure, unspecified: Secondary | ICD-10-CM | POA: Insufficient documentation

## 2023-02-14 LAB — MAGNESIUM: Magnesium: 2.1 mg/dL (ref 1.7–2.4)

## 2023-02-14 LAB — BASIC METABOLIC PANEL
Anion gap: 6 (ref 5–15)
BUN: 43 mg/dL — ABNORMAL HIGH (ref 8–23)
CO2: 30 mmol/L (ref 22–32)
Calcium: 9.3 mg/dL (ref 8.9–10.3)
Chloride: 105 mmol/L (ref 98–111)
Creatinine, Ser: 1.46 mg/dL — ABNORMAL HIGH (ref 0.44–1.00)
GFR, Estimated: 36 mL/min — ABNORMAL LOW (ref >60)
Glucose, Bld: 100 mg/dL — ABNORMAL HIGH (ref 70–99)
Potassium: 3.7 mmol/L (ref 3.5–5.1)
Sodium: 141 mmol/L (ref 135–145)

## 2023-02-14 LAB — CBC
HCT: 31 % — ABNORMAL LOW (ref 36.0–46.0)
Hemoglobin: 9.6 g/dL — ABNORMAL LOW (ref 12.0–15.0)
MCH: 31.5 pg (ref 26.0–34.0)
MCHC: 31 g/dL (ref 30.0–36.0)
MCV: 101.6 fL — ABNORMAL HIGH (ref 80.0–100.0)
Platelets: 276 10*3/uL (ref 150–400)
RBC: 3.05 MIL/uL — ABNORMAL LOW (ref 3.87–5.11)
RDW: 19.7 % — ABNORMAL HIGH (ref 11.5–15.5)
WBC: 4.9 10*3/uL (ref 4.0–10.5)
nRBC: 0 % (ref 0.0–0.2)

## 2023-02-14 LAB — GLUCOSE, CAPILLARY
Glucose-Capillary: 101 mg/dL — ABNORMAL HIGH (ref 70–99)
Glucose-Capillary: 404 mg/dL — ABNORMAL HIGH (ref 70–99)
Glucose-Capillary: 196 mg/dL — ABNORMAL HIGH (ref 70–99)
Glucose-Capillary: 89 mg/dL (ref 70–99)
Glucose-Capillary: 138 mg/dL — ABNORMAL HIGH (ref 70–99)

## 2023-02-14 LAB — COOXEMETRY PANEL
Carboxyhemoglobin: 2.8 % — ABNORMAL HIGH (ref 0.5–1.5)
Methemoglobin: <0.7 % (ref 0.0–1.5)
O2 Saturation: 59.9 %
Total hemoglobin: 9.9 g/dL — ABNORMAL LOW (ref 12.0–16.0)

## 2023-02-14 MED ORDER — POTASSIUM CHLORIDE CRYS ER 20 MEQ PO TBCR
20.0000 meq | EXTENDED_RELEASE_TABLET | Freq: Once | ORAL | Status: AC
  Administered 2023-02-14: 20 meq via ORAL
  Filled 2023-02-14: qty 1

## 2023-02-14 MED ORDER — ACETAMINOPHEN 325 MG PO TABS
650.0000 mg | ORAL_TABLET | Freq: Four times a day (QID) | ORAL | Status: DC
  Administered 2023-02-14 – 2023-02-19 (×20): 650 mg via ORAL
  Filled 2023-02-14 (×20): qty 2

## 2023-02-14 MED ORDER — TRAMADOL HCL 50 MG PO TABS
50.0000 mg | ORAL_TABLET | Freq: Two times a day (BID) | ORAL | Status: DC
  Administered 2023-02-14 – 2023-02-19 (×10): 50 mg via ORAL
  Filled 2023-02-14 (×10): qty 1

## 2023-02-14 NOTE — Progress Notes (Addendum)
  Progress Note    02/14/2023 10:23 AM 8 Days Post-Op  Subjective:  no complaints   Vitals:   02/13/23 2331 02/14/23 0812  BP: 122/66 122/61  Pulse: 72 70  Resp: 18 18  Temp: 97.8 F (36.6 C) 98.2 F (36.8 C)  SpO2: 97% 98%   Physical Exam: Lungs:  non labored Incisions:  R groin with vac in place Extremities:  feet warm with motor and sensation intact Neurologic: A&O  CBC    Component Value Date/Time   WBC 4.9 02/14/2023 0500   RBC 3.05 (L) 02/14/2023 0500   HGB 9.6 (L) 02/14/2023 0500   HGB 14.2 10/09/2020 0935   HCT 31.0 (L) 02/14/2023 0500   HCT 44.9 10/09/2020 0935   PLT 276 02/14/2023 0500   PLT 379 10/09/2020 0935   MCV 101.6 (H) 02/14/2023 0500   MCV 91 10/09/2020 0935   MCH 31.5 02/14/2023 0500   MCHC 31.0 02/14/2023 0500   RDW 19.7 (H) 02/14/2023 0500   RDW 13.4 10/09/2020 0935   LYMPHSABS 0.9 01/15/2023 0950   LYMPHSABS 1.1 10/09/2020 0935   MONOABS 1.4 (H) 01/15/2023 0950   EOSABS 0.6 (H) 01/15/2023 0950   EOSABS 0.2 10/09/2020 0935   BASOSABS 0.1 01/15/2023 0950   BASOSABS 0.1 10/09/2020 0935    BMET    Component Value Date/Time   NA 141 02/14/2023 0500   NA 145 (H) 07/07/2022 1007   K 3.7 02/14/2023 0500   CL 105 02/14/2023 0500   CO2 30 02/14/2023 0500   GLUCOSE 100 (H) 02/14/2023 0500   BUN 43 (H) 02/14/2023 0500   BUN 24 07/07/2022 1007   CREATININE 1.46 (H) 02/14/2023 0500   CREATININE 1.28 (H) 01/14/2022 1551   CALCIUM 9.3 02/14/2023 0500   GFRNONAA 36 (L) 02/14/2023 0500   GFRAA 49 (L) 10/09/2020 0933    INR    Component Value Date/Time   INR 1.4 (H) 01/10/2023 2145     Intake/Output Summary (Last 24 hours) at 02/14/2023 1023 Last data filed at 02/14/2023 0834 Gross per 24 hour  Intake 240 ml  Output --  Net 240 ml      Assessment/Plan:  82 y.o. female with R groin wound 8 Days Post-Op   Appreciate M-Th wound vac changes.  Ready for Inpt rehab from vascular surgery perspective.  Can d/c antibiotics at this time.   Husband understands after inpt rehab, will be discharged home with nursing for [facility] changes.    Broadus John MD [facility] [phone] 02/14/2023 10:23 AM

## 2023-02-14 NOTE — TOC Progression Note (Signed)
Transition of Care [facility]) - Progression Note    Patient Details  Name: Natasha Woodard MRN: [id number] Date of Birth: 06-18-1941  Transition of Care [facility]) CM/SW Contact  Erenest Rasher, RN Phone Number: [phone]  02/14/2023, 10:26 AM  Clinical Narrative:     CM spoke to vascular surgeon, pt ready for IP rehab. Signed [employer] wound vac order and faxed to [employer] rep, Olivia Mackie. States pt will use the hospital wound vac while in IP rehab. She will follow her and set up home wound vac when she ready for dc at CIR. [place] rehab Admission Coordinator, Pamala Hurry to speak to pt and family. Pt has agreed on IP rehab. Surgeon will speak to husband.   Expected Discharge Plan: IP Rehab Facility Barriers to Discharge: Continued Medical Work up  Expected Discharge Plan and Services   Discharge Planning Services: CM Consult Post Acute Care Choice: IP Rehab Living arrangements for the past 2 months: Single Family Home                                       Social Determinants of Health (SDOH) Interventions SDOH Screenings   Food Insecurity: No Food Insecurity (01/12/2023)  Housing: Low Risk  (01/12/2023)  Transportation Needs: No Transportation Needs (01/12/2023)  Utilities: Not At Risk (01/12/2023)  Depression (PHQ2-9): Low Risk  (05/12/2022)  Financial Resource Strain: Low Risk  (05/12/2022)  Physical Activity: Sufficiently Active (05/12/2022)  Stress: No Stress Concern Present (05/12/2022)  Tobacco Use: Medium Risk (02/07/2023)    Readmission Risk Interventions     No data to display

## 2023-02-14 NOTE — Progress Notes (Signed)
Occupational Therapy Treatment Patient Details Name: Natasha Woodard MRN: [id number] DOB: [birth year] Today's Date: 02/14/2023   History of present illness 82 yo female admitted 1/30 with chest pain, NSTEMI s/p cardiac cath and IABP placed. Pt with bleeding and impaired circulation with IABP removed 1/31 and thrombin injection to pseudoaneurysm. 2/4 Rt groin pseudoaneurysm rupture s/p repair with placement of wound VAC. Pt with VT arrest on 2/8. ICD placed 2/12. Pt with rt groin washout and debridement and sartorius muscle flap with VAC placement on 2/15. PMhx: CAD, dilated cardiomyopathy, CKD, GERD, HFpEF, HTN, HLD, T2DM, ichthyosis.   OT comments  Patient with continued good progress toward patient focused goals.  Patient able to complete HEP prescribed with demonstration and assist as needed.  Patient with good understanding of ICD precautions to L upper extremity.  Patient needing generalized Min Guard for in room mobility and hygiene post BM.  Patient continues to be motivated, and demonstrates the ability to tolerate 3+ hours of post acute rehab.  OT will continue in the acute setting to address deficits, and AIR for post acute rehab.     Recommendations for follow up therapy are one component of a multi-disciplinary discharge planning process, led by the attending physician.  Recommendations may be updated based on patient status, additional functional criteria and insurance authorization.    Follow Up Recommendations  Acute inpatient rehab (3hours/day)     Assistance Recommended at Discharge Frequent or constant Supervision/Assistance  Patient can return home with the following  A lot of help with bathing/dressing/bathroom;Assistance with cooking/housework;Direct supervision/assist for medications management;Direct supervision/assist for financial management;Assist for transportation;Help with stairs or ramp for entrance;Two people to help with walking and/or transfers   Equipment  Recommendations  None recommended by OT    Recommendations for Other Services      Precautions / Restrictions Precautions Precautions: Fall;ICD/Pacemaker;Other (comment) Precaution Comments: wound VAC rt groin, LUE ICD placed 2/12 no lifting LUE past shoulder or L shld resistance exercise x6 weeks Restrictions Weight Bearing Restrictions: No       Mobility Bed Mobility                    Transfers Overall transfer level: Needs assistance Equipment used: Rolling walker (2 wheels) Transfers: Sit to/from Stand Sit to Stand: Min guard                 Balance Overall balance assessment: Needs assistance Sitting-balance support: Feet supported Sitting balance-Leahy Scale: Good     Standing balance support: Bilateral upper extremity supported, No upper extremity supported, Reliant on assistive device for balance Standing balance-Leahy Scale: Fair                             ADL either performed or assessed with clinical judgement   ADL       Grooming: Wash/dry hands;Wash/dry face;Supervision/safety;Standing                   Toilet Transfer: Min guard;Ambulation;Regular Toilet;Rolling walker (2 wheels)   Toileting- Clothing Manipulation and Hygiene: Min guard;Sit to/from stand              Extremity/Trunk Assessment Upper Extremity Assessment Upper Extremity Assessment: Generalized weakness   Lower Extremity Assessment Lower Extremity Assessment: Defer to PT evaluation   Cervical / Trunk Assessment Cervical / Trunk Assessment: Normal  Cognition Arousal/Alertness: Awake/alert Behavior During Therapy: WFL for tasks assessed/performed Overall Cognitive Status: Within Functional Limits for tasks assessed                                          Exercises General Exercises - Upper Extremity Shoulder Flexion: Strengthening, Both, 10 reps, Seated, Theraband Theraband Level (Shoulder  Flexion): Level 1 (Yellow) Shoulder Extension: Strengthening, Both, 5 reps, Seated, Theraband Theraband Level (Shoulder Extension): Level 1 (Yellow) Shoulder Horizontal ABduction: Strengthening, Both, 10 reps, Seated, Theraband Theraband Level (Shoulder Horizontal Abduction): Level 1 (Yellow) Shoulder Horizontal ADduction: Strengthening, Both, 10 reps, Seated, Theraband Theraband Level (Shoulder Horizontal Adduction): Level 1 (Yellow)    Shoulder Instructions       General Comments      Pertinent Vitals/ Pain       Pain Assessment Pain Assessment: Faces Faces Pain Scale: Hurts a little bit Pain Location: R groin Pain Descriptors / Indicators: Tender Pain Intervention(s): Monitored during session                                                          Frequency  Min 2X/week        Progress Toward Goals  OT Goals(current goals can now be found in the care plan section)  Progress towards OT goals: Progressing toward goals  Acute Rehab OT Goals OT Goal Formulation: With patient Time For Goal Achievement: 02/24/23 Potential to Achieve Goals: Good ADL Goals Pt Will Perform Grooming: with set-up;standing Pt Will Perform Lower Body Dressing: with min guard assist;sit to/from stand;with adaptive equipment Pt Will Transfer to Toilet: with supervision;ambulating;regular height toilet Pt Will Perform Toileting - Clothing Manipulation and hygiene: with modified independence;sit to/from stand  Plan Discharge plan remains appropriate;Frequency remains appropriate    Co-evaluation                 AM-PAC OT "6 Clicks" Daily Activity     Outcome Measure   Help from another person eating meals?: None Help from another person taking care of personal grooming?: A Little Help from another person toileting, which includes using toliet, bedpan, or urinal?: A Little Help from another person bathing (including washing, rinsing, drying)?: A Lot Help  from another person to put on and taking off regular upper body clothing?: A Little Help from another person to put on and taking off regular lower body clothing?: A Lot 6 Click Score: 17    End of Session Equipment Utilized During Treatment: Gait belt  OT Visit Diagnosis: Other abnormalities of gait and mobility (R26.89);Muscle weakness (generalized) (M62.81);Pain Pain - Right/Left: Right Pain - part of body: Leg   Activity Tolerance Patient tolerated treatment well   Patient Left in bed;with call bell/phone within reach;with family/visitor present   Nurse Communication Mobility status        Time: 1129-1150 OT Time Calculation (min): 21 min  Charges: OT General Charges $OT Visit: 1 Visit OT Treatments $Self Care/Home Management : 8-22 mins  02/14/2023  RP, OTR/L  Acute Rehabilitation Services  Office:  [phone]   Metta Clines 02/14/2023, 11:57 AM

## 2023-02-14 NOTE — Progress Notes (Addendum)
Progress Note   Patient: Natasha Woodard [id number] DOB: 10-27-1941 DOA: 01/10/2023     34 DOS: the patient was seen and examined on 02/14/2023   Brief hospital course: 82 year old woman presented with chest pain.  Found to have acute lateral wall MI secondary to distal left circumflex occlusion by cardiac catheterization, complicated by severe ischemic MR and acute systolic heart failure with low output.  Required placement of IABP which was later removed secondary to concern for limb ischemia.  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  Subsequently suffered a ventricular tachycardia arrest, ICD was placed, several trips to the OR for groin washout, debridement and wound VAC placement. Now ready for CIR.  1/31 Underwent thrombin injection to right femoral PSA . Hgb drifting down 10.3>8.3>6.8  2/1 Started on amio drip to suppress PVCs. Got dose of IV lasix x1. CVP low.  2/4 Ruptured PSA. Returned to OR for repair. VAC in place 2/5 Midodrine 5 mg three times daily added.  Back in A fib.  2/7 TEE with mod MR.  2/8 VT arrest--> shock x1 ROSC after 2 minutes. Given lidocaine and amio was increased to 60 mg.  2/9 Transvenous pacemaker placed for overdrive pacing with PMVT.  2/11 Milrinone restarted, Co-ox 49% 2/12 PICC removed. S/p [employer] Sci ICD  2/14 Milrinone stopped . Amio drip stopped.  2/15 Returned to OR for washout and VAC change with sartorious muscle flap.  2/20 OR for washout and wound vac placement.   2/26 OR groin washout, debridement and vac change  3/5 ready for CIR  Assessment and Plan: Acute lateral wall MI  Cardiogenic shock Acute on chronic systolic CHF Severe ischemic MR Status post ICD placement 2/12 Continue management per advanced heart failure team   Paroxysmal atrial fibrillation VT arrest 2/7 S/p DCCV x 1 with CPR, ROSC after 2 minutes.  Polymorphic VT, appeared bradycardia-mediated with  intermittent junctional rhythm. S/p DDD ICD on 2/12 Continue amiodarone   Right femoral pseudoaneurysm Right groin wound, secondary infection Patient developed pseudoaneurysm following balloon pump, s/p thrombin injection by Dr. Carlis Abbott 1/31.  2/4 stat u/s showed recurrent PSA with active bleeding into PSA. S/p PSA repair with VAC placement.  2/15 VAC change with sartorious muscle flap.  2/20, went to the OR for washout and wound VAC placement. 2/27, underwent wound VAC change in OR.  Tentative plan of wound VAC change at bedside on 3/1. Wound culture showed polymicrobial growth showing moderate Morganella and Enterococcus with few  pseudomonas.   Completed IV Zosyn per vascular surgery Okay for discharge to CIR from vascular standpoint.  Wound VAC changes Mondays and Thursdays     AKI on CKD stage 3b Baseline creatinine 1.3-1.5.  Creatinine peaked at 2.06 on 2/13.  Improved but has been somewhat labile but is down to 1.46.      Chronic anemia Severe iron deficiency Secondary to blood loss and iron deficiency given IV iron 2/18 Hemoglobin stable   Cerumen impaction Usually addressed by outpatient specialist with scope and tweezers.  Inpatient attempts failed.  Follow-up as an outpatient.      Subjective:  Feels better  Physical Exam: Vitals:   02/13/23 2009 02/13/23 2331 02/14/23 0812 02/14/23 1100  BP: 135/66 122/66 122/61 (!) 117/52  Pulse: 70 72 70 71  Resp: '18 18 18 18  '$ Temp: 98.2 F (36.8 C) 97.8 F (36.6 C) 98.2 F (36.8 C) 98.2 F (36.8 C)  TempSrc: Oral Oral Oral Oral  SpO2:  100% 97% 98% 96%  Weight:      Height:       Physical Exam Vitals reviewed.  Constitutional:      General: She is not in acute distress.    Appearance: She is not ill-appearing or toxic-appearing.  Cardiovascular:     Rate and Rhythm: Normal rate and regular rhythm.     Heart sounds: No murmur heard. Pulmonary:     Effort: Pulmonary effort is normal. No respiratory distress.      Breath sounds: No wheezing, rhonchi or rales.  Neurological:     Mental Status: She is alert.  Psychiatric:        Mood and Affect: Mood normal.        Behavior: Behavior normal.     Data Reviewed: CBG with one high, monitor Creatinine down to 1.46 Hgb down to 9.6  Family Communication:   Disposition: Status is: Inpatient Remains inpatient appropriate because: awaiting CIR  Planned Discharge Destination:  CIR    Time spent: 20 minutes  Author: Murray Hodgkins, MD 02/14/2023 2:40 PM  For on call review [email or web address].

## 2023-02-14 NOTE — Progress Notes (Signed)
Physical Medicine and Rehabilitation Consult Reason for Consult:Weakness, functional deficits Referring Physician: Sarajane Jews   HPI: Natasha Woodard is a 82 y.o. female who presented on January 10, 2023 with shortness of breath and chest pain. She was admitted with an acute lateral wall MI and was also found to have significant akinesis/hypokinesis with severe mitral regurgitation.  An IABP was placed in the Cath Lab.  Unfortunately, overnight she developed a hematoma to the IABP access site with associated limb ischemia and the IABP was removed.  Patient was seen by vascular surgery for assessment of a right femoral pseudoaneurysm and a thrombin injection was performed.  On 01/15/2023 patient developed swelling in her right groin due to a significant hematoma which rapidly extended down the leg.  Patient underwent emergent right groin exploration and hematoma evacuation with right profunda femoris repair by vascular surgery.  On 01/19/2023 patient went into VT arrest in 01/20/2019 for a transvenous pacemaker placed with an ICD placed on 2/12.  On 01/24/2023 patient had her staples removed and a wound VAC replaced in the right groin area due to incisional breakdown and underlying tissue necrosis.  She went to the OR on 2/15 for washout, debridement, sartorious muscle flap and VAC replacement by Dr. Virl Cagey.  She has been followed essentially weekly by vascular surgery for VAC changes as well as wound washouts in the OR, the most recent of which was on February 06, 2023.  VAC changes have been converted to bedside and now wound care ostomy nurse is managing the VAC changes.  Patient has been up with PT and OT addressing self-care and mobility.  Most recently she has been min assist for sit to stand transfers and min assist 100 feet for gait with prolonged rest breaks due to the stamina and generalized weakness.  ADLs have been limited thus far due to generalized weakness and stamina.   Review of Systems   Constitutional:  Negative for fever.  HENT: Negative.    Eyes:  Negative for blurred vision.  Respiratory:  Positive for cough and shortness of breath.   Cardiovascular:  Positive for leg swelling.  Gastrointestinal:  Negative for nausea and vomiting.  Genitourinary:  Negative for dysuria.  Musculoskeletal:  Positive for back pain and joint pain.  Skin:        Wound vac  Neurological:  Positive for weakness. Negative for headaches.  Psychiatric/Behavioral:  Negative for depression.    Past Medical History:  Diagnosis Date   AICD (automatic cardioverter/defibrillator) present    Calcific tendonitis 2008   Treatment of left leg   Cardiomyopathy ([place]) 06/2017   a) Echo: EF 25-30%. GR 1-2 DD w/ elevated LVEDP. Mild valvular Dz; b) Cardiac MRI 2/'19: frequent PVCs (diffiuclt to interpret) - EF ~27% w/ diffuse HK.  No evidence of infarct, infiltrative Dz or myocarditis. -- ? if related to PVCs. c) f/u Echo 6/'19: EF 35-40%. Gr 1 DD. Diffuse HK.-> d) Jan 2020 EF 50 to 55%.  Mod MAC, mod LAE.  Ao Sclerosis; e) Echo 1/'21 - EF 60-65%, Gr II DD. Mild Mod MR.    Chronic combined systolic and diastolic CHF, NYHA class 2 and ACA/AHA stage C    Now essentially resolved, back to simply diastolic CHF   Coronary artery disease, non-occlusive    mild-moderate CAD 06/30/17 cath   CTS (carpal tunnel syndrome)    Diabetes mellitus    Type II   Dysrhythmia    patient said that she cant remember  what it is   Frequent unifocal PVCs 01/2018   Event Monitor: NSR, W/ frequent multifocal PVCs (9%-down from 30% prior to amiodarone) and PACs.  Nighttime bradycardia suggestive of OSA.';  Zio patch September 2023: PVC burden now 6.1%.   Hiatal hernia    with reflux   Hyperlipidemia    Statin intolerant   Hypertension    Ichthyosis congenita    Intermittent claudication of both lower extremities due to atherosclerosis ([place]) 08/22/2021   LEA Dopplers 09/09/2021:  Right: Total occlusion noted in the superficial  femoral artery. Atherosclerosis noted throughout extremity, see note above. Three vessel runoff.  Left: Total occlusion noted in the superficial femoral artery and/or popliteal artery. Total occlusion noted in the distal anterior tibial artery. Athereosclerosis noted throughout extremity.    NSVD (normal spontaneous vaginal delivery) 1971 & 1972   Obesity    Plantar fasciitis    Left   Presence of permanent cardiac pacemaker    Pulmonary nodule    Imaged multiple times and benign appearing   Sleep apnea    Wears glasses    Past Surgical History:  Procedure Laterality Date   ABDOMINAL HYSTERECTOMY  1975   TAH-BSO   ABSCESS DRAINAGE  1972   right breast   APPLICATION OF WOUND VAC Right 01/15/2023   Procedure: APPLICATION OF INCISIONAL WOUND VAC;  Surgeon: Broadus John, MD;  Location: [facility] OR;  Service: Vascular;  Laterality: Right;   APPLICATION OF WOUND VAC Right 01/26/2023   Procedure: APPLICATION OF WOUND VAC RIGHT GROIN WASHOUT AND SARTORIUS MUSCLE FLAP;  Surgeon: Broadus John, MD;  Location: [place];  Service: Vascular;  Laterality: Right;   ARTERY REPAIR Right 01/15/2023   Procedure: RIGHT PROFUNDA ARTERY REPAIR WITH 2000G MYRIAD MORCELLS;  Surgeon: Broadus John, MD;  Location: [place];  Service: Vascular;  Laterality: Right;   BREAST BIOPSY Left 01/14/2009   Stereo- Benign   CARDIAC MRI  01/2018   Difficult to interpret 2/2 PVCs. Normal LV size - EF ~27% with diffuse HK. Mild RV dilation - normal fxn. -- NO MYOCARDIAL LGI => no definitive evidence of prior MI, Infiltrative Dz or Myocarditis -- suspect NICM, possibly related to PVCs.   CARPAL TUNNEL RELEASE Right 07/02/2014   Procedure: RIGHT CARPAL TUNNEL RELEASE;  Surgeon: Wynonia Sours, MD;  Location: [place];  Service: Orthopedics;  Laterality: Right;   CARPAL TUNNEL RELEASE Left 06/11/2015   Procedure: LEFT CARPAL TUNNEL RELEASE;  Surgeon: Daryll Brod, MD;  Location: [place];  Service:  Orthopedics;  Laterality: Left;  REGIONAL/FAB   COLONOSCOPY     corn removal  1968   right   CORONARY STENT INTERVENTION N/A 01/10/2023   Procedure: CORONARY STENT INTERVENTION;  Surgeon: Troy Sine, MD;  Location: [place] CV LAB;  Service: Cardiovascular;  Laterality: N/A;   GROIN DEBRIDEMENT Right 02/06/2023   Procedure: RIGHT GROIN DEBRIDEMENT WITH WOUND VAC CHANGE;  Surgeon: Broadus John, MD;  Location: [place];  Service: Vascular;  Laterality: Right;   HEMATOMA EVACUATION Right 01/15/2023   Procedure: EVACUATION HEMATOMA RIGHT GROIN;  Surgeon: Broadus John, MD;  Location: [facility] OR;  Service: Vascular;  Laterality: Right;   Holter Monitor  06/2017   ~17,000 PVC beats - majority were singlets, some couplets. 44 brief 3-7 beat runs of NSVT. Also noted were less frequent PACs with 11 runs (longest 15 beats)   IABP INSERTION Right 01/10/2023   Procedure: IABP Insertion;  Surgeon: Shelva Majestic  A, MD;  Location: [place] CV LAB;  Service: Cardiovascular;  Laterality: Right;   ICD IMPLANT N/A 01/23/2023   Procedure: ICD IMPLANT;  Surgeon: Evans Lance, MD;  Location: [place] CV LAB;  Service: Cardiovascular;  Laterality: N/A;   INCISION AND DRAINAGE OF WOUND Right 01/31/2023   Procedure: IRRIGATION AND DEBRIDEMENT RIGHT GROIN WOUND AND WOUND VAC CHANGE;  Surgeon: Broadus John, MD;  Location: [place];  Service: Vascular;  Laterality: Right;   OPEN REDUCTION INTERNAL FIXATION (ORIF) DISTAL RADIAL FRACTURE Left 07/21/2017   Procedure: OPEN REDUCTION INTERNAL FIXATION (ORIF) LEFT DISTAL RADIAL FRACTURE;  Surgeon: Renette Butters, MD;  Location: [place];  Service: Orthopedics;  Laterality: Left;   RIGHT/LEFT HEART CATH AND CORONARY ANGIOGRAPHY N/A 06/30/2017   Procedure: Right/Left Heart Cath and Coronary Angiography;  Surgeon: Leonie Man, MD;  Location: [facility] INVASIVE CV LAB:  pRCA 55%, pCx 40%, OM1 45%, mCx 50%, D2 50% - LVEF 25-35%. Moderately elevated LVEDP(26 mmHg with PCWP 16  mmHg).  FICK CO/CI: 4.47/2.48. PA pressures 47/14 mmHg with a mean of 27 mm.   RIGHT/LEFT HEART CATH AND CORONARY ANGIOGRAPHY N/A 01/10/2023   Procedure: RIGHT/LEFT HEART CATH AND CORONARY ANGIOGRAPHY;  Surgeon: Troy Sine, MD;  Location: [place] CV LAB;  Service: Cardiovascular;  Laterality: N/A;   ROTATOR CUFF REPAIR     left shoulder   TEMPORARY PACEMAKER N/A 01/19/2023   Procedure: TEMPORARY PACEMAKER;  Surgeon: Jolaine Artist, MD;  Location: [place] CV LAB;  Service: Cardiovascular;  Laterality: N/A;   TRANSTHORACIC ECHOCARDIOGRAM  06/2017   EF 25 and 30%. GR 1 DD. Mild diastolic dysfunction with elevated LVEDP. Mild valvular disease.   TRANSTHORACIC ECHOCARDIOGRAM  11'18, 6/'19    a) EF remains 30-35%.  Diffuse hypokinesis noted.  Severe LA dilation; b) Improved EF 35-40%.  Diffuse HK.  GR 1 DD.  Moderate TR.  Mild RV dilation.   TRANSTHORACIC ECHOCARDIOGRAM  01/28/2022   Follow-up echo: EF 50 to 55%.  No RWMA.  GRII DD.  Severe LA dilation.  Mild RA dilation with normal PAP and RAP.  Mild to moderate TR (no significant change noted)   Zio patch  08/2022   3-day: Currently NSR (HR range 47-91 with average 64 bpm) 1 F AVB with IVCD/BBB.  Rare isolated PACs (<1%).  Frequent PVCs (6.1% with rare couplets and triplets.  Also bigeminy.  2 atrial runs 8 beats 5 2 seconds.  No sustained arrhythmias.   ZIO Patch 14 d Event Monitor  10/2018   Results as below < 1% PVC.  (Notably improved after starting amiodarone)   Family History  Problem Relation Age of Onset   Heart disease Mother        s/p pacemaker   Diabetes Mother    Heart disease Father    Diabetes Father    Heart disease Brother        MI   Cancer Brother        Lung   Hypertension Other    Colon cancer Neg Hx    Breast cancer Neg Hx    Social History:  reports that she quit smoking about 29 years ago. Her smoking use included cigarettes. She has never used smokeless tobacco. She reports that she does not drink  alcohol and does not use drugs. Allergies:  Allergies  Allergen Reactions   Ezetimibe-Simvastatin Other (See Comments)    REACTION: Muscle aches (side effect)   Lipitor [Atorvastatin] Other (See Comments)  Leg weakness    Claritin [Loratadine]     Possible cause of nightmares.     Spironolactone     Elevated potassium/creatinine   Tamiflu [Oseltamivir Phosphate] Other (See Comments)    nightmares   Pravastatin Sodium Other (See Comments)    REACTION: Muscle aches (side effect)   Sulfonamide Derivatives Nausea And Vomiting   Medications Prior to Admission  Medication Sig Dispense Refill   acetaminophen (TYLENOL) 500 MG tablet Take 500 mg by mouth every 6 (six) hours as needed for mild pain.     amiodarone (PACERONE) 200 MG tablet TAKE ONE-HALF TABLET BY MOUTH EVERY TUES/THURS/SAT (Patient taking differently: Take 100 mg by mouth See admin instructions. TAKE ONE-HALF TABLET BY MOUTH EVERY TUES/THURS/SAT) 90 tablet 3   aspirin 81 MG tablet Take 1 tablet (81 mg total) by mouth daily.     carvedilol (COREG) 25 MG tablet TAKE 1 TABLET BY MOUTH TWICE DAILY (Patient taking differently: Take 25 mg by mouth 2 (two) times daily with a meal.) 180 tablet 1   Cholecalciferol (VITAMIN D) 1000 UNITS capsule Take 1,000 Units by mouth daily.     doxycycline (VIBRA-TABS) 100 MG tablet Take 1 tablet (100 mg total) by mouth 2 (two) times daily. 20 tablet 0   Fluticasone-Umeclidin-Vilant (TRELEGY ELLIPTA) 100-62.5-25 MCG/ACT AEPB Inhale 1 puff into the lungs daily. (Patient not taking: Reported on 12/02/2022) 2 each 0   furosemide (LASIX) 20 MG tablet Take 1 tablet (20 mg total) by mouth daily. 90 tablet 3   Ipratropium-Albuterol (COMBIVENT RESPIMAT) 20-100 MCG/ACT AERS respimat Inhale 1 puff into the lungs every 6 (six) hours as needed for wheezing or shortness of breath. 4 g 0   JARDIANCE 10 MG TABS tablet TAKE 1/2 TABLET(5 MG) BY MOUTH DAILY (Patient taking differently: Take 12.5 mg by mouth daily.) 15  tablet 6   metFORMIN (GLUCOPHAGE) 500 MG tablet TAKE 1/2 TABLET BY MOUTH DAILY WITH A MEAL (Patient taking differently: Take 250 mg by mouth daily.) 90 tablet 0   rosuvastatin (CRESTOR) 10 MG tablet TAKE 1 TABLET(10 MG) BY MOUTH DAILY (Patient taking differently: Take 10 mg by mouth daily.) 90 tablet 3   sacubitril-valsartan (ENTRESTO) 97-103 MG Take 1 tablet by mouth 2 (two) times daily. 180 tablet 3   traMADol (ULTRAM) 50 MG tablet TAKE 1 TABLET(50 MG) BY MOUTH THREE TIMES DAILY AS NEEDED (Patient taking differently: Take 50 mg by mouth 3 (three) times daily between meals as needed for moderate pain.) 270 tablet 1    Home: Home Living Family/patient expects to be discharged to:: Private residence Living Arrangements: Spouse/significant other Available Help at Discharge: Family Type of Home: House Home Access: Stairs to enter Technical brewer of Steps: 3 Entrance Stairs-Rails:  (+ rail) Home Layout: Two level, Bed/bath upstairs Alternate Level Stairs-Number of Steps: 14 Bathroom Shower/Tub: Chiropodist: Standard Home Equipment: Cane - single point  Functional History: Prior Function Prior Level of Function : Independent/Modified Independent, Driving Mobility Comments: no AD in home, [facility], uses cane outside home ADLs Comments: independent ADls, light IADLs, drives and manages meds Functional Status:  Mobility: Bed Mobility Overal bed mobility: Needs Assistance Bed Mobility: Rolling, Sidelying to Sit, Sit to Supine Rolling: Min guard Sidelying to sit: Min guard, HOB elevated Supine to sit: HOB elevated, Min guard Sit to supine: Mod assist Sit to sidelying: +2 for physical assistance, Mod assist General bed mobility comments: pt received/remained in recliner. Transfers Overall transfer level: Needs assistance Equipment used:  Rolling walker (2 wheels) Transfers: Sit to/from Stand Sit to Stand: Min guard Bed to/from chair/wheelchair/BSC  transfer type:: Step pivot Step pivot transfers: Min assist, +2 safety/equipment General transfer comment: Mod cues for improved technique with stand>sit as pt tends to plop down without reaching back when fatigued, mn guard to rise but minA for lowering assist. Ambulation/Gait Ambulation/Gait assistance: Min guard Gait Distance (Feet): 100 Feet (x2 with prolonged rest break leaning back against wall) Assistive device: Rolling walker (2 wheels) Gait Pattern/deviations: Step-through pattern, Trunk flexed, Drifts right/left, Decreased stride length General Gait Details: min guard for safety and line assist, pt at times bumping her RW into objects in room, possibly visual deficit. x3 standing breaks in hallway with one rest pt leaning back against the wall. Gait velocity: decreased Gait velocity interpretation: <1.31 ft/sec, indicative of household ambulator Pre-gait activities: Side stepped up side of bed with walker with +2 min assist Stairs: Yes Stairs assistance: Min guard Stair Management: Step to pattern, Forwards, With walker Number of Stairs: 10 (single 7" step x10 reps) General stair comments: single 7" platform step in room x10 reps in a row with BUE support to simulate B railings    ADL: ADL Overall ADL's : Needs assistance/impaired Eating/Feeding: Modified independent Grooming: Wash/dry hands, Wash/dry face, Supervision/safety, Standing Grooming Details (indicate cue type and reason): standing at sink Upper Body Bathing: Minimal assistance, Sitting Lower Body Bathing: Moderate assistance, Sit to/from stand Upper Body Dressing : Moderate assistance Lower Body Dressing: Total assistance, Bed level Toilet Transfer: Min guard, Ambulation, Regular Toilet, Rolling walker (2 wheels) Toileting- Clothing Manipulation and Hygiene: Min guard, Sit to/from stand Functional mobility during ADLs: Min guard, Rolling walker (2 wheels)  Cognition: Cognition Overall Cognitive Status: Within  Functional Limits for tasks assessed Orientation Level: Oriented X4 Cognition Arousal/Alertness: Awake/alert Behavior During Therapy: WFL for tasks assessed/performed Overall Cognitive Status: Within Functional Limits for tasks assessed Area of Impairment: Memory, Following commands, Problem solving Current Attention Level: Sustained Memory: Decreased short-term memory Following Commands: Follows one step commands with increased time Safety/Judgement: Decreased awareness of safety, Decreased awareness of deficits Awareness: Emergent Problem Solving: Slow processing General Comments: HoH, increased time to process and respond to cues. Poor carryover of instruction on roles of PTA/OT/Mobility from previous sessions.  Blood pressure (!) 117/52, pulse 71, temperature 98.2 F (36.8 C), temperature source Oral, resp. rate 18, height '5\' 2"'$  (1.575 m), weight 67 kg, SpO2 96 %. Physical Exam Constitutional:      General: She is not in acute distress. HENT:     Head: Normocephalic.     Nose: Nose normal.     Mouth/Throat:     Mouth: Mucous membranes are moist.  Eyes:     Pupils: Pupils are equal, round, and reactive to light.  Cardiovascular:     Rate and Rhythm: Normal rate.  Pulmonary:     Effort: Pulmonary effort is normal.  Abdominal:     Palpations: Abdomen is soft.  Musculoskeletal:     Cervical back: Normal range of motion.  Skin:    General: Skin is warm.     Comments: Vac in right inguinal area  Neurological:     Mental Status: She is alert.     Comments: Alert and oriented x 3. Normal insight and awareness. Intact Memory. Normal language and speech. Cranial nerve exam unremarkable. HOH. UE motor grossly 5/5 bilaterally. RLE 3/5 prox to 4/5 distally. LLE 4/5 to 4+/5 prox to distal. Sensory exam normal for light touch and pain in  all 4 limbs. No limb ataxia or cerebellar signs. No abnormal tone appreciated.    Psychiatric:        Mood and Affect: Mood normal.        Behavior:  Behavior normal.     Results for orders placed or performed during the hospital encounter of 01/10/23 (from the past 24 hour(s))  Glucose, capillary     Status: Abnormal   Collection Time: 02/13/23  3:55 PM  Result Value Ref Range   Glucose-Capillary 126 (H) 70 - 99 mg/dL   Comment 1 Notify RN   Glucose, capillary     Status: Abnormal   Collection Time: 02/13/23  8:59 PM  Result Value Ref Range   Glucose-Capillary 137 (H) 70 - 99 mg/dL   Comment 1 Notify RN    Comment 2 Document in Chart   Magnesium     Status: None   Collection Time: 02/14/23  5:00 AM  Result Value Ref Range   Magnesium 2.1 1.7 - 2.4 mg/dL  Basic metabolic panel     Status: Abnormal   Collection Time: 02/14/23  5:00 AM  Result Value Ref Range   Sodium 141 135 - 145 mmol/L   Potassium 3.7 3.5 - 5.1 mmol/L   Chloride 105 98 - 111 mmol/L   CO2 30 22 - 32 mmol/L   Glucose, Bld 100 (H) 70 - 99 mg/dL   BUN 43 (H) 8 - 23 mg/dL   Creatinine, Ser 1.46 (H) 0.44 - 1.00 mg/dL   Calcium 9.3 8.9 - 10.3 mg/dL   GFR, Estimated 36 (L) >60 mL/min   Anion gap 6 5 - 15  CBC     Status: Abnormal   Collection Time: 02/14/23  5:00 AM  Result Value Ref Range   WBC 4.9 4.0 - 10.5 K/uL   RBC 3.05 (L) 3.87 - 5.11 MIL/uL   Hemoglobin 9.6 (L) 12.0 - 15.0 g/dL   HCT 31.0 (L) 36.0 - 46.0 %   MCV 101.6 (H) 80.0 - 100.0 fL   MCH 31.5 26.0 - 34.0 pg   MCHC 31.0 30.0 - 36.0 g/dL   RDW 19.7 (H) 11.5 - 15.5 %   Platelets 276 150 - 400 K/uL   nRBC 0.0 0.0 - 0.2 %  Cooxemetry Panel (carboxy, met, total hgb, O2 sat)     Status: Abnormal   Collection Time: 02/14/23  5:35 AM  Result Value Ref Range   Total hemoglobin 9.9 (L) 12.0 - 16.0 g/dL   O2 Saturation 59.9 %   Carboxyhemoglobin 2.8 (H) 0.5 - 1.5 %   Methemoglobin <0.7 0.0 - 1.5 %  Glucose, capillary     Status: Abnormal   Collection Time: 02/14/23  5:43 AM  Result Value Ref Range   Glucose-Capillary 101 (H) 70 - 99 mg/dL   Comment 1 Notify RN    Comment 2 Document in Chart    Glucose, capillary     Status: Abnormal   Collection Time: 02/14/23 11:00 AM  Result Value Ref Range   Glucose-Capillary 404 (H) 70 - 99 mg/dL  Glucose, capillary     Status: Abnormal   Collection Time: 02/14/23 12:03 PM  Result Value Ref Range   Glucose-Capillary 196 (H) 70 - 99 mg/dL   No results found.  Assessment/Plan: Diagnosis: significant debility related to MI/VT cardiac arrest, complicated by pseudoaneurysm in the right groin which required a sartorious muscle flap, multiple wash-outs and vac.  Does the need for close, 24 hr/day medical supervision in concert  with the patient's rehab needs make it unreasonable for this patient to be served in a less intensive setting? Yes Co-Morbidities requiring supervision/potential complications:  -CHF, MVD -CAD -DM II -pain and wound care considerations -PAD Due to bladder management, bowel management, safety, skin/wound care, disease management, medication administration, pain management, and patient education, does the patient require 24 hr/day rehab nursing? Yes Does the patient require coordinated care of a physician, rehab nurse, therapy disciplines of PT, OT to address physical and functional deficits in the context of the above medical diagnosis(es)? Yes Addressing deficits in the following areas: balance, endurance, locomotion, strength, transferring, bowel/bladder control, bathing, dressing, feeding, grooming, toileting, and psychosocial support Can the patient actively participate in an intensive therapy program of at least 3 hrs of therapy per day at least 5 days per week? Yes The potential for patient to make measurable gains while on inpatient rehab is excellent Anticipated functional outcomes upon discharge from inpatient rehab are modified independent and supervision  with PT, modified independent and supervision with OT, n/a with SLP. Estimated rehab length of stay to reach the above functional goals is: 8-12 days Anticipated  discharge destination: Home Overall Rehab/Functional Prognosis: excellent  POST ACUTE RECOMMENDATIONS: This patient's condition is appropriate for continued rehabilitative care in the following setting: CIR Patient has agreed to participate in recommended program. Yes Note that insurance prior authorization may be required for reimbursement for recommended care.  Comment: Had long discussion with patient regarding expectations for rehab, potential LOS. She will be much more independent but will need significantly more time once home to improve exercise stamina. Also, some of that will depend upon her cardiac outcome. Additionally, while our average LOS is around 14 days, there is no guarantee that she will be with [place] that long. Rehab Admissions Coordinator to follow up    I have personally performed a face to face diagnostic evaluation of this patient. Additionally, I have examined the patient's medical record including any pertinent labs and radiographic images. If the physician assistant has documented in this note, I have reviewed and edited or otherwise concur with the physician assistant's documentation.  Thanks,  Meredith Staggers, MD 02/14/2023

## 2023-02-14 NOTE — Progress Notes (Signed)
  Inpatient Rehabilitation Admissions Coordinator   I met at bedside with patient and her spouse. We reviewed goals and expectations of a possible CIR admit. I reviewed that she would be there for therapy, medical and nursing care as well as Wound care provided by [facility] on Mondays and Thursdays. She would discharge home with Home health when rehab goals met and not remain until groin wound completely healed. He is in agreement. I will begin Auth with [facility] for possible admit pending payor approval. Dr Naaman Plummer to consult.  Danne Baxter, RN, MSN Rehab Admissions Coordinator [phone] 02/14/2023 12:09 PM

## 2023-02-15 DIAGNOSIS — N183 Chronic kidney disease, stage 3 unspecified: Secondary | ICD-10-CM

## 2023-02-15 DIAGNOSIS — I249 Acute ischemic heart disease, unspecified: Secondary | ICD-10-CM

## 2023-02-15 DIAGNOSIS — N179 Acute kidney failure, unspecified: Secondary | ICD-10-CM

## 2023-02-15 LAB — BASIC METABOLIC PANEL
Anion gap: 3 — ABNORMAL LOW (ref 5–15)
BUN: 48 mg/dL — ABNORMAL HIGH (ref 8–23)
CO2: 33 mmol/L — ABNORMAL HIGH (ref 22–32)
Calcium: 9.6 mg/dL (ref 8.9–10.3)
Chloride: 105 mmol/L (ref 98–111)
Creatinine, Ser: 1.36 mg/dL — ABNORMAL HIGH (ref 0.44–1.00)
GFR, Estimated: 39 mL/min — ABNORMAL LOW (ref >60)
Glucose, Bld: 141 mg/dL — ABNORMAL HIGH (ref 70–99)
Potassium: 3.8 mmol/L (ref 3.5–5.1)
Sodium: 141 mmol/L (ref 135–145)

## 2023-02-15 LAB — CBC
HCT: 33.9 % — ABNORMAL LOW (ref 36.0–46.0)
Hemoglobin: 9.9 g/dL — ABNORMAL LOW (ref 12.0–15.0)
MCH: 30.5 pg (ref 26.0–34.0)
MCHC: 29.2 g/dL — ABNORMAL LOW (ref 30.0–36.0)
MCV: 104.3 fL — ABNORMAL HIGH (ref 80.0–100.0)
Platelets: 304 10*3/uL (ref 150–400)
RBC: 3.25 MIL/uL — ABNORMAL LOW (ref 3.87–5.11)
RDW: 19.6 % — ABNORMAL HIGH (ref 11.5–15.5)
WBC: 4.7 10*3/uL (ref 4.0–10.5)
nRBC: 0 % (ref 0.0–0.2)

## 2023-02-15 LAB — GLUCOSE, CAPILLARY
Glucose-Capillary: 130 mg/dL — ABNORMAL HIGH (ref 70–99)
Glucose-Capillary: 161 mg/dL — ABNORMAL HIGH (ref 70–99)
Glucose-Capillary: 104 mg/dL — ABNORMAL HIGH (ref 70–99)
Glucose-Capillary: 160 mg/dL — ABNORMAL HIGH (ref 70–99)

## 2023-02-15 LAB — COOXEMETRY PANEL
Carboxyhemoglobin: 2.8 % — ABNORMAL HIGH (ref 0.5–1.5)
Methemoglobin: 1.4 % (ref 0.0–1.5)
O2 Saturation: 70.4 %
Total hemoglobin: 10.4 g/dL — ABNORMAL LOW (ref 12.0–16.0)

## 2023-02-15 LAB — MAGNESIUM: Magnesium: 2.1 mg/dL (ref 1.7–2.4)

## 2023-02-15 NOTE — Progress Notes (Signed)
Mobility Specialist Progress Note:   02/15/23 1020  Mobility  Activity Ambulated with assistance in hallway  Level of Assistance Standby assist, set-up cues, supervision of patient - no hands on  Assistive Device Front wheel walker  Distance Ambulated (ft) 320 ft  Activity Response Tolerated well  $Mobility charge 1 Mobility   Pt in chair willing to participate in mobility. No complaints of pain. Required 1 standing rest break d/t fatigue. Left in chair with call bell in reach and all needs met.   Gareth Eagle Shadrack Brummitt Mobility Specialist Please contact via [employer] or  Rehab Office at [phone]

## 2023-02-15 NOTE — Progress Notes (Signed)
Mobility Specialist Progress Note:   02/15/23 0903  Mobility  Activity Ambulated with assistance to bathroom  Level of Assistance Standby assist, set-up cues, supervision of patient - no hands on  Distance Ambulated (ft) 12 ft  Activity Response Tolerated well  $Mobility charge 1 Mobility   Pt in bathroom needing help getting to chair. No complaints of pain. Pt declined hallway ambulation til after she eats breakfast. Left in chair with call bell in reach and all needs met.   Gareth Eagle Libia Fazzini Mobility Specialist Please contact via [employer] or  Rehab Office at [phone]

## 2023-02-15 NOTE — PMR Pre-admission (Signed)
PMR Admission Coordinator Pre-Admission Assessment  Patient: Natasha Woodard is an 82 y.o., female MRN: [id number] DOB: Sep 05, 1941 Height: 5\' 2"  (157.5 cm) Weight: 67 kg              Insurance Information HMO: yes    PPO:      PCP:      IPA:      80/20:      OTHER:  PRIMARY: UHC medicare      Policy#: [id number]      Subscriber: pt CM Name: Otila Kluver at [facility] appeals      Phone#: [phone] option #7     Fax#: [phone] Pre-Cert#: [id number] Initial denial and appealed on 3/7 . Approved for 7 days from date of admit     Employer:  Benefits:  Phone #: [phone]     Name: 3/5 Eff. Date: 12/12/22     Deduct: none      Out of Pocket Max: $3600      Life Max: none  CIR: $295 co pay per day days 1 until 5;      SNF: no copay per day days 1 until 20; $203 co pay per day days 21 until 100 Outpatient: $20 per visit     Co-Pay: visits per medical neccesity Home Health: 100%      Co-Pay: visits per medical neccesity DME: 80%     Co-Pay: 20% Providers: in network  SECONDARY: none  Financial Counselor:       Phone#:   The Engineer, [employer]" for patients in Inpatient Rehabilitation Facilities with attached "Privacy Act [place] Records" was provided and verbally reviewed with: Patient and Family  Emergency Contact Information Contact Information     Name Relation Home Work Mobile   [place]   [phone]   [place] Sister [phone]     [place]ust[place], [place] Spouse [phone]        Current Medical History  Patient Admitting Diagnosis: Debility  History of Present Illness: 82 y.o. female who presented on January 10, 2023 with shortness of breath and chest pain. Past medical history of PVCs on low dose amiodarone, dilated cardiomyopathy with improvement in LVEF to 50 to 55% as of February 2023, and mild to moderate nonobstructive CAD by cath in 2019.   She was admitted with an acute lateral wall MI and was also found to have significant  akinesis/hypokinesis with severe mitral regurgitation.  An IABP was placed in the Cath Lab.  Unfortunately, overnight she developed a hematoma to the IABP access site with associated limb ischemia and the IABP was removed.  Patient was seen by vascular surgery for assessment of a right femoral pseudoaneurysm and a thrombin injection was performed.  On 01/15/2023 patient developed swelling in her right groin due to a significant hematoma which rapidly extended down the leg.  Patient underwent emergent right groin exploration and hematoma evacuation with right profunda femoris repair by vascular surgery.  On 01/19/2023 patient went into VT arrest in 01/20/2019 for a transvenous pacemaker placed with an ICD placed on 2/12.  On 01/24/2023 patient had her staples removed and a wound VAC replaced in the right groin area due to incisional breakdown and underlying tissue necrosis.  She went to the OR on 2/15 for washout, debridement, sartorious muscle flap and VAC replacement by Dr. Virl Cagey.  She has been followed essentially weekly by vascular surgery for VAC changes as well as wound washouts in the OR, the most recent of which was on February 06, 2023.  VAC changes have been converted to bedside and now wound care ostomy nurse is managing the VAC changes.  Patient has been up with PT and OT addressing self-care and mobility.  Most recently she has been min assist for sit to stand transfers and min assist 100 feet for gait with prolonged rest breaks due to the stamina and generalized weakness.  ADLs have been limited thus far due to generalized weakness and stamina.   Patient[facility] medical [facility] from [facility] has been reviewed by the rehabilitation admission coordinator and physician.  Past Medical History  Past Medical History:  Diagnosis Date   AICD (automatic cardioverter/defibrillator) present    Calcific tendonitis 2008   Treatment of left leg   Cardiomyopathy ([place]) 06/2017   a) Echo: EF 25-30%. GR 1-2 DD  w/ elevated LVEDP. Mild valvular Dz; b) Cardiac MRI 2/'19: frequent PVCs (diffiuclt to interpret) - EF ~27% w/ diffuse HK.  No evidence of infarct, infiltrative Dz or myocarditis. -- ? if related to PVCs. c) f/u Echo 6/'19: EF 35-40%. Gr 1 DD. Diffuse HK.-> d) Jan 2020 EF 50 to 55%.  Mod MAC, mod LAE.  Ao Sclerosis; e) Echo 1/'21 - EF 60-65%, Gr II DD. Mild Mod MR.    Chronic combined systolic and diastolic CHF, NYHA class 2 and ACA/AHA stage C    Now essentially resolved, back to simply diastolic CHF   Coronary artery disease, non-occlusive    mild-moderate CAD 06/30/17 cath   CTS (carpal tunnel syndrome)    Diabetes mellitus    Type II   Dysrhythmia    patient said that she cant remember what it is   Frequent unifocal PVCs 01/2018   Event Monitor: NSR, W/ frequent multifocal PVCs (9%-down from 30% prior to amiodarone) and PACs.  Nighttime bradycardia suggestive of OSA.';  Zio patch September 2023: PVC burden now 6.1%.   Hiatal hernia    with reflux   Hyperlipidemia    Statin intolerant   Hypertension    Ichthyosis congenita    Intermittent claudication of both lower extremities due to atherosclerosis ([place]) 08/22/2021   LEA Dopplers 09/09/2021:  Right: Total occlusion noted in the superficial femoral artery. Atherosclerosis noted throughout extremity, see note above. Three vessel runoff.  Left: Total occlusion noted in the superficial femoral artery and/or popliteal artery. Total occlusion noted in the distal anterior tibial artery. Athereosclerosis noted throughout extremity.    NSVD (normal spontaneous vaginal delivery) 1971 & 1972   Obesity    Plantar fasciitis    Left   Presence of permanent cardiac pacemaker    Pulmonary nodule    Imaged multiple times and benign appearing   Sleep apnea    Wears glasses    Has the patient had major surgery during 100 days prior to admission? Yes  Family History  family history includes Cancer in her brother; Diabetes in her father and mother;  Heart disease in her brother, father, and mother; Hypertension in an other family member.  Current Medications   Current Facility-Administered Medications:    0.9 %  sodium chloride infusion, 250 mL, Intravenous, PRN, Schuh, McKenzi P, PA-C, Last Rate: 10 mL/hr at 01/23/23 1235, 250 mL at 01/23/23 1235   acetaminophen (TYLENOL) tablet 650 mg, 650 mg, Oral, Q6H, Samuella Cota, MD, 650 mg at 02/19/23 0558   alum & mag hydroxide-simeth (MAALOX/MYLANTA) 200-200-20 MG/5ML suspension 30 mL, 30 mL, Oral, Q6H PRN, Schuh, McKenzi P, PA-C, 30 mL at 01/20/23 1557   amiodarone (  PACERONE) tablet 200 mg, 200 mg, Oral, Daily, Arrien, Jimmy Picket, MD   apixaban Arne [place]) tablet 2.5 mg, 2.5 mg, Oral, BID, Schuh, McKenzi P, PA-C, 2.5 mg at 02/18/23 2215   Chlorhexidine Gluconate Cloth 2 % PADS 6 each, 6 each, Topical, Daily, Schuh, McKenzi P, PA-C, 6 each at 02/18/23 1103   clopidogrel (PLAVIX) tablet 75 mg, 75 mg, Oral, Daily, Schuh, McKenzi P, PA-C, 75 mg at 02/18/23 1103   clotrimazole (GYNE-LOTRIMIN) vaginal cream 1 Applicatorful, 1 Applicatorful, Vaginal, QHS, Arrien, Jimmy Picket, MD, 1 Applicatorful at 14/43/15 2100   feeding supplement (ENSURE ENLIVE / ENSURE PLUS) liquid 237 mL, 237 mL, Oral, TID BM, Schuh, McKenzi P, PA-C, 237 mL at 02/18/23 2215   hydrocortisone cream 1 %, , Topical, PRN, Schuh, McKenzi P, PA-C, Given at 01/25/23 1340   insulin aspart (novoLOG) injection 0-15 Units, 0-15 Units, Subcutaneous, TID WC, Schuh, McKenzi P, PA-C, 3 Units at 02/18/23 1353   ipratropium-albuterol (DUONEB) 0.5-2.5 (3) MG/3ML nebulizer solution 3 mL, 3 mL, Nebulization, Q6H PRN, Schuh, McKenzi P, PA-C   lidocaine (LIDODERM) 5 % 1 patch, 1 patch, Transdermal, Q24H, Schuh, McKenzi P, PA-C, 1 patch at 02/16/23 2342   lidocaine (LIDODERM) 5 % 1 patch, 1 patch, Transdermal, Q24H, Schuh, McKenzi P, PA-C, 1 patch at 02/18/23 1356   losartan (COZAAR) tablet 12.5 mg, 12.5 mg, Oral, Daily, Sira, Zackery, MD, 12.5  mg at 02/18/23 1102   melatonin tablet 5 mg, 5 mg, Oral, QHS PRN, Dagoberto Ligas, PA-C, 5 mg at 02/18/23 2213   multivitamin with minerals tablet 1 tablet, 1 tablet, Oral, Daily, Dahal, Binaya, MD, 1 tablet at 02/18/23 1102   nitroGLYCERIN (NITROSTAT) SL tablet 0.4 mg, 0.4 mg, Sublingual, Q5 min PRN, Schuh, McKenzi P, PA-C, 0.4 mg at 01/10/23 1044   nutrition supplement (JUVEN) (JUVEN) powder packet 1 packet, 1 packet, Oral, BID BM, Schuh, McKenzi P, PA-C, 1 packet at 02/18/23 1353   ondansetron (ZOFRAN) injection 4 mg, 4 mg, Intravenous, Q6H PRN, Schuh, McKenzi P, PA-C, 4 mg at 01/10/23 2326   Oral care mouth rinse, 15 mL, Mouth Rinse, PRN, Schuh, McKenzi P, PA-C, 15 mL at 01/13/23 1938   rosuvastatin (CRESTOR) tablet 10 mg, 10 mg, Oral, QHS, Schuh, McKenzi P, PA-C, 10 mg at 02/18/23 2213   simethicone (MYLICON) chewable tablet 80 mg, 80 mg, Oral, Q6H PRN, Schuh, McKenzi P, PA-C, 80 mg at 02/17/23 1808   sodium chloride flush (NS) 0.9 % injection 10-40 mL, 10-40 mL, Intracatheter, PRN, Schuh, McKenzi P, PA-C, 20 mL at 02/09/23 0349   sodium chloride flush (NS) 0.9 % injection 3 mL, 3 mL, Intravenous, Q12H, Schuh, McKenzi P, PA-C, 3 mL at 02/18/23 2214   spironolactone (ALDACTONE) tablet 12.5 mg, 12.5 mg, Oral, Daily, Ferrel Logan, Alma L, NP, 12.5 mg at 02/18/23 1102   torsemide (DEMADEX) tablet 20 mg, 20 mg, Oral, Daily, Dahal, Binaya, MD, 20 mg at 02/18/23 1103   traMADol (ULTRAM) tablet 50 mg, 50 mg, Oral, Q12H, Samuella Cota, MD, 50 mg at 02/19/23 0600  Patients Current Diet:  Diet Order             Diet regular Room service appropriate? Yes; Fluid consistency: Thin  Diet effective now                  Precautions / Restrictions Precautions Precautions: Fall, ICD/Pacemaker, Other (comment) Precaution Comments: wound VAC rt groin, LUE ICD placed 2/12 no lifting LUE past shoulder or L shld resistance exercise x6 weeks Restrictions  Weight Bearing Restrictions: No Other  Position/Activity Restrictions: see PT precs for LUE precs   Has the patient had 2 or more falls or a fall with injury in the past year?No  Prior Activity Level Community (5-7x/wk): Independent, active, drives  Prior Functional Level Prior Function Prior Level of Function : Independent/Modified Independent, Driving Mobility Comments: no AD in home, [facility], uses cane outside home ADLs Comments: independent ADls, light IADLs, drives and manages meds  Self Care: Did the patient need help bathing, dressing, using the toilet or eating?  Independent  Indoor Mobility: Did the patient need assistance with walking from room to room (with or without device)? Independent  Stairs: Did the patient need assistance with internal or external stairs (with or without device)? Independent  Functional Cognition: Did the patient need help planning regular tasks such as shopping or remembering to take medications? Independent  Patient Information Are you of Hispanic, Latino/a,or Spanish origin?: A. No, not of Hispanic, Latino/a, or Spanish origin What is your race?: B. Black or African American Do you need or want an interpreter to communicate with a doctor or health care staff?: 0. No  Patient's Response To:  Health Literacy and Transportation Is the patient able to respond to health literacy and transportation needs?: Yes Health Literacy - How often do you need to have someone help you when you read instructions, pamphlets, or other written material from your doctor or pharmacy?: Never In the past 12 months, has lack of transportation kept you from medical appointments or from getting medications?: No In the past 12 months, has lack of transportation kept you from meetings, work, or from getting things needed for daily living?: No  Home Assistive Devices / Quay Devices/Equipment: Radio producer (specify quad or straight) Home Equipment: Cane - single point  Prior Device  Use: Indicate devices/aids used by the patient prior to current illness, exacerbation or injury? Cane outside the home  Current Functional Level Cognition  Overall Cognitive Status: Within Functional Limits for tasks assessed Current Attention Level: Sustained Orientation Level: Oriented X4 Following Commands: Follows one step commands consistently Safety/Judgement: Decreased awareness of safety General Comments: HoH, needs reminders to wait for therapist to arrange lines prior to proceeding.    Extremity Assessment (includes Sensation/Coordination)  Upper Extremity Assessment: Generalized weakness LUE Deficits / Details: generalized weakness  Lower Extremity Assessment: Defer to PT evaluation    ADLs  Overall ADL's : Needs assistance/impaired Eating/Feeding: Modified independent Grooming: Wash/dry hands, Standing, Supervision/safety Grooming Details (indicate cue type and reason): Standing at sink, Supervision with BUEs unsupported Upper Body Bathing: Minimal assistance, Sitting Lower Body Bathing: Moderate assistance, Sit to/from stand Upper Body Dressing : Moderate assistance Lower Body Dressing: Supervision/safety, Sitting/lateral leans Lower Body Dressing Details (indicate cue type and reason): Max A to don R sock seated EOB, Pt donned L sock using figure 4 technique with Supervision Toilet Transfer: Rolling walker (2 wheels), Cueing for safety Toilet Transfer Details (indicate cue type and reason): Pt complete toilet t/f with SBA + RW. Needed cueing to slow down pace to allow for line/lead management. Toileting- Water quality scientist and Hygiene: Supervision/safety, Sit to/from stand Functional mobility during ADLs: Min guard, Rolling walker (2 wheels)    Mobility  Overal bed mobility: Needs Assistance Bed Mobility: Rolling, Sidelying to Sit, Sit to Sidelying Rolling: Min guard Sidelying to sit: Min guard, HOB elevated Supine to sit: Min guard Sit to supine: Mod  assist Sit to sidelying: Min guard, HOB elevated General bed  mobility comments: pt received seated EOB with OT present    Transfers  Overall transfer level: Needs assistance Equipment used: Rolling walker (2 wheels) Transfers: Sit to/from Stand Sit to Stand: Supervision Bed to/from chair/wheelchair/BSC transfer type:: Step pivot Step pivot transfers: Min assist, +2 safety/equipment General transfer comment: cues for safety and UE placement    Ambulation / Gait / Stairs / Wheelchair Mobility  Ambulation/Gait Ambulation/Gait assistance: Counsellor (Feet): 50 Feet Assistive device: Rolling walker (2 wheels) Gait Pattern/deviations: Step-through pattern, Trunk flexed, Drifts right/left, Decreased stride length General Gait Details: min guard for safety and line assist, pt with poor awareness of lines and needs dense safety cues; also postural cues needed Gait velocity: decreased Gait velocity interpretation: <1.31 ft/sec, indicative of household ambulator Pre-gait activities: Side stepped up side of bed with walker with +2 min assist Stairs: Yes Stairs assistance: Min guard Stair Management: Step to pattern, Forwards, With walker Number of Stairs: 10 General stair comments: single 7" platform step in room x10 reps in a row with BUE support to simulate B railings    Posture / Balance Dynamic Sitting Balance Sitting balance - Comments: no UE support with static sitting Balance Overall balance assessment: Needs assistance Sitting-balance support: Feet supported, No upper extremity supported Sitting balance-Leahy Scale: Good Sitting balance - Comments: no UE support with static sitting Standing balance support: No upper extremity supported, Reliant on assistive device for balance Standing balance-Leahy Scale: Poor Standing balance comment: reliant on RW for dynamic tasks    Special needs/care consideration  WOC Nurse Consult Note: R groin washout and debridement 2/26,  vascular changed first NPWT dressing 2/29, WOC consult for future NPWT dressing changes Mon/Thurs Reason for Consult: NPWT  Wound type: surgical  Pressure Injury POA: NA, not pressure  Measurement: 4 cms x 8 cms x 2 cms  Wound bed: 80% red moist 20% yellow fibrinous tissue  Drainage (amount, consistency, odor)  minimal serosanguinous  Periwound: intact  Dressing procedure/placement/frequency: Removed old NPWT dressing Cleansed wound with normal saline Periwound skin protected with skin barrier wipe; did utilize 2" barrier ring that was cut in two and flattened out for creases to left and right of wound    Filled wound with  _1 piece white foam and 1 piece black foam   Sealed NPWT dressing at 14mm HG   Patient deferred any pain medication prior to dressing change; tolerated well    WOC nurse will continue to provide NPWT dressing change MON/THURS per vascular  due to the complexity of the dressing change.    Next NPWT dressing change THURS 02/16/2023.    Thank you,      Heather Bullins MSN, RN-BC, CWON   ICD placed this admission   Previous Home Environment  Living Arrangements: Spouse/significant other  Lives With: Spouse Available Help at Discharge: Family, Available 24 hours/day Type of Home: House Home Layout: Two level, Bed/bath upstairs Alternate Level Stairs-Number of Steps: 14 Home Access: Stairs to enter Entrance Stairs-Rails:  (+ rail) Technical brewer of Steps: 3 Bathroom Shower/Tub: Optometrist: Yes How Accessible: Accessible via walker [place]: No  Discharge Living Setting Plans for Discharge Living Setting: Patient's home, Lives with (comment) (spouse) Type of Home at Discharge: House Discharge Home Layout: Two level, Bed/bath upstairs Alternate Level Stairs-Number of Steps: 14 Discharge Home Access: Stairs to enter Entrance Stairs-Number of Steps: 3 Discharge Bathroom Shower/Tub:  Tub/shower unit Discharge Bathroom Toilet: Standard Discharge Bathroom Accessibility: Yes How Accessible:  Accessible via walker Does the patient have any problems obtaining your medications?: No  Social/Family/Support Systems Patient Roles: Spouse, Parent Contact Information: spouse, [name] Anticipated Caregiver: spouse and family Anticipated Caregiver's Contact Information: see contacts Ability/Limitations of Caregiver: none Caregiver Availability: 24/7 Discharge Plan Discussed with Primary Caregiver: Yes Is Caregiver In Agreement with Plan?: Yes Does Caregiver/Family have Issues with Lodging/Transportation while Pt is in Rehab?: No  Spouse to take FMLA from [employer] job.  Goals Patient/Family Goal for Rehab: Mod I to supervision with PT and OT Expected length of stay: ELOS 8 to 12 days or less Additional Information: spouse, patient and son are aware that she will not remain at CIR until groin wound completely healed. Will discharge home with [place] and likely wound VAC changes Pt/Family Agrees to Admission and willing to participate: Yes Program Orientation Provided & Reviewed with Pt/Caregiver Including Roles  & Responsibilities: Yes Additional Information Needs: spouse hesistant on discharge home with need for wound care to right groin  Spouse , patient and son aware that patient will not remain admitted to CIR until right groin wound completely healed. Will go home with [facility] and likely wound VAC  Decrease burden of Care through IP rehab admission: n/a  Possible need for SNF placement upon discharge:not anticipated  Patient Condition: This patient's medical and functional status has changed since the consult dated: 02/14/23 in which the Rehabilitation Physician determined and documented that the patient's condition is appropriate for intensive rehabilitative care in an inpatient rehabilitation facility. See "History of Present Illness" (above) for medical update.  Functional changes are: overall min guard assist. Patient's medical and functional status update has been discussed with the Rehabilitation physician and patient remains appropriate for inpatient rehabilitation. Will admit to inpatient rehab today.  Preadmission Screen Completed By: Danne Baxter RN MSN   02/19/2023 9:19 AM ______________________________________________________________________   Discussed status with Dr. Naaman Plummer on 02/19/23 at 9:19 AM and received approval for admission today.  Admission Coordinator: Danne Baxter RN MSN, with time 9:19 AM Sudie Grumbling 02/19/23

## 2023-02-15 NOTE — Progress Notes (Addendum)
Inpatient Rehabilitation Admissions Coordinator   I await insurance determination for a possible CIR admit.  Danne Baxter, RN, MSN Rehab Admissions Coordinator [phone] 02/15/2023 8:13 AM   Dr Cathlean Sauer notified of peer to peer request with [facility] [place] MD.  Danne Baxter, RN, MSN Rehab Admissions Coordinator [phone] 02/15/2023 3:54 PM

## 2023-02-15 NOTE — Progress Notes (Signed)
Physical Therapy Treatment Patient Details Name: Natasha Woodard MRN: [id number] DOB: Oct 18, 1941 Today's Date: 02/15/2023   History of Present Illness 82 yo female admitted 1/30 with chest pain, NSTEMI s/p cardiac cath and IABP placed. Pt with bleeding and impaired circulation with IABP removed 1/31 and thrombin injection to pseudoaneurysm. 2/4 Rt groin pseudoaneurysm rupture s/p repair with placement of wound VAC. Pt with VT arrest on 2/8. ICD placed 2/12. Pt with rt groin washout and debridement and sartorius muscle flap with VAC placement on 2/15. PMhx: CAD, dilated cardiomyopathy, CKD, GERD, HFpEF, HTN, HLD, T2DM, ichthyosis.    PT Comments    Pt received in supine, c/o bed feeling wet and agreeable to therapy session with emphasis on transfer and gait training and instruction on pressure relief and bed mobility for decreased back pain. Pt with poor insight into need for rolling q2H in supine for pressure relief despite extensive instruction and pt requesting large foam pillow under her low back and bottom, despite instruction that this will not properly relieve her pressure and may increase her discomfort if she does not continue repositioning q2H in bed. Pt needing up to minA with dense safety/sequencing cues for all tasks. RN notified she did use toilet, at least 123m but was not able to capture exact amount in case MD is charting output. Pt continues to benefit from PT services to progress toward functional mobility goals.    Recommendations for follow up therapy are one component of a multi-disciplinary discharge planning process, led by the attending physician.  Recommendations may be updated based on patient status, additional functional criteria and insurance authorization.  Follow Up Recommendations  Acute inpatient rehab (3hours/day)     Assistance Recommended at Discharge Frequent or constant Supervision/Assistance  Patient can return home with the following A little help with  walking and/or transfers;A little help with bathing/dressing/bathroom;Help with stairs or ramp for entrance;Assist for transportation;Assistance with cooking/housework   Equipment Recommendations  Rolling walker (2 wheels)    Recommendations for Other Services       Precautions / Restrictions Precautions Precautions: Fall;ICD/Pacemaker;Other (comment) (LUE ICD) Precaution Comments: wound VAC rt groin, LUE ICD placed 2/12 no lifting LUE past shoulder or L shld resistance exercise x6 weeks Restrictions Weight Bearing Restrictions: No     Mobility  Bed Mobility Overal bed mobility: Needs Assistance Bed Mobility: Rolling, Sidelying to Sit, Sit to Sidelying Rolling: Min guard Sidelying to sit: Min guard, HOB elevated     Sit to sidelying: Min guard, HOB elevated General bed mobility comments: cues for safer technique, pt relying on elevated HOB and bed rail to achieve upright safely; log roll due to increased LBP. Supine<>EOB x2 trials due to discomfort with pillows    Transfers Overall transfer level: Needs assistance Equipment used: Rolling walker (2 wheels) Transfers: Sit to/from Stand Sit to Stand: Min assist           General transfer comment: Mod cues for improved technique with stand>sit as pt tends to plop down without reaching back when fatigued, min guard to rise but minA for lowering assist. STS x 1 rep from lower toilet and x3 reps from EOB    Ambulation/Gait Ambulation/Gait assistance: Min guard Gait Distance (Feet): 30 Feet (x2) Assistive device: Rolling walker (2 wheels) Gait Pattern/deviations: Step-through pattern, Trunk flexed, Drifts right/left, Decreased stride length Gait velocity: decreased     General Gait Details: min guard for safety and line assist, pt with poor awareness of lines and needs dense safety cues; also  postural cues needed   Stairs             Wheelchair Mobility    Modified Rankin (Stroke Patients Only)        Balance Overall balance assessment: Needs assistance Sitting-balance support: Feet supported Sitting balance-Leahy Scale: Fair Sitting balance - Comments: no UE support with static sitting   Standing balance support: Bilateral upper extremity supported, No upper extremity supported, Reliant on assistive device for balance Standing balance-Leahy Scale: Fair Standing balance comment: static standing briefly fair, poor dynamic standing balance without AD.                            Cognition Arousal/Alertness: Awake/alert Behavior During Therapy: WFL for tasks assessed/performed Overall Cognitive Status: Within Functional Limits for tasks assessed                                 General Comments: HoH, increased time to process and respond to cues. Pt with poor insight into deficits and also frequent urinary incontinence.        Exercises Other Exercises Other Exercises: PTA reviewed movement restrictions which are typically 6 wks post-op. No lifting LUE arm over her shoulder height. Other Exercises: bridges in supine x 5 reps Other Exercises: supine BLE AROM: heel slides, ankle pumps; standing hip flexion x10 reps ea    General Comments General comments (skin integrity, edema, etc.): VSS on RA, poor pleth signal on sensor, placed sensor on opposite hand to see if this would improve reading; HR WFL      Pertinent Vitals/Pain Pain Assessment Pain Assessment: 0-10 Pain Score: 8  Pain Location: low back pain Pain Descriptors / Indicators: Sore, Discomfort, Grimacing Pain Intervention(s): Monitored during session, Limited activity within patient's tolerance, Repositioned, Patient requesting pain meds-RN notified    Home Living                          Prior Function            PT Goals (current goals can now be found in the care plan section) Acute Rehab PT Goals Patient Stated Goal: to get stronger at therapy prior to home PT Goal  Formulation: With patient Time For Goal Achievement: 02/23/23 Progress towards PT goals: Progressing toward goals    Frequency    Min 3X/week      PT Plan Current plan remains appropriate    Co-evaluation              AM-PAC PT "6 Clicks" Mobility   Outcome Measure  Help needed turning from your back to your side while in a flat bed without using bedrails?: A Little Help needed moving from lying on your back to sitting on the side of a flat bed without using bedrails?: A Little Help needed moving to and from a bed to a chair (including a wheelchair)?: A Little Help needed standing up from a chair using your arms (e.g., wheelchair or bedside chair)?: A Little Help needed to walk in hospital room?: A Little Help needed climbing 3-5 steps with a railing? : A Lot (mod safety cues) 6 Click Score: 17    End of Session Equipment Utilized During Treatment: Gait belt Activity Tolerance: Patient tolerated treatment well;Patient limited by pain Patient left: with call bell/phone within reach;in bed;with bed alarm set;Other (comment);with family/visitor present (heels floated, sister  and spouse present) Nurse Communication: Mobility status;Patient requests pain meds PT Visit Diagnosis: Unsteadiness on feet (R26.81);Other abnormalities of gait and mobility (R26.89);Muscle weakness (generalized) (M62.81)     Time: [id number] PT Time Calculation (min) (ACUTE ONLY): 43 min  Charges:  $Gait Training: 8-22 mins $Therapeutic Exercise: 8-22 mins $Therapeutic Activity: 8-22 mins                     Eunie Lawn P., PTA Acute Rehabilitation Services Secure Chat Preferred 9a-5:30pm Office: [place] 02/15/2023, 6:59 PM

## 2023-02-15 NOTE — Progress Notes (Addendum)
Progress Note   Patient: Natasha Woodard [id number] DOB: Oct 22, 1941 DOA: 01/10/2023     35 DOS: the patient was seen and examined on 02/15/2023   Brief hospital course: 82 year old woman presented with chest pain.  Found to have acute lateral wall MI secondary to distal left circumflex occlusion by cardiac catheterization, complicated by severe ischemic MR and acute systolic heart failure with low output.  Required placement of IABP which was later removed secondary to concern for limb ischemia.  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  Subsequently suffered a ventricular tachycardia arrest, ICD was placed, several trips to the OR for groin washout, debridement and wound VAC placement. Now ready for CIR.  1/31 Underwent thrombin injection to right femoral PSA . Hgb drifting down 10.3>8.3>6.8  2/1 Started on amio drip to suppress PVCs. Got dose of IV lasix x1. CVP low.  2/4 Ruptured PSA. Returned to OR for repair. VAC in place 2/5 Midodrine 5 mg three times daily added.  Back in A fib.  2/7 TEE with mod MR.  2/8 VT arrest--> shock x1 ROSC after 2 minutes. Given lidocaine and amio was increased to 60 mg.  2/9 Transvenous pacemaker placed for overdrive pacing with PMVT.  2/11 Milrinone restarted, Co-ox 49% 2/12 PICC removed. S/p [employer] Sci ICD  2/14 Milrinone stopped . Amio drip stopped.  2/15 Returned to OR for washout and VAC change with sartorious muscle flap.  2/20 OR for washout and wound vac placement.   2/26 OR groin washout, debridement and vac change  3/5 ready for CIR 03/06 peer to peer call with insurance medical director, and CIR has been declined. Apparently patient's functional and cognitive function is too good to qualify for CIR level of services.   Assessment and Plan: No notes have been filed under this hospital service. Service: Hospitalist  Acute lateral wall MI  Cardiogenic shock Acute on chronic  systolic CHF Severe ischemic MR Status post ICD placement 2/12 Clinically euvolemic.  Continue with losartan, torsemide and spironolactone.    Paroxysmal atrial fibrillation VT arrest 2/7 S/p DCCV x 1 with CPR, ROSC after 2 minutes.  Polymorphic VT, appeared bradycardia-mediated with intermittent junctional rhythm. S/p DDD ICD on 2/12 Continue amiodarone   Right femoral pseudoaneurysm Right groin wound, secondary infection Patient developed pseudoaneurysm following balloon pump, s/p thrombin injection by Dr. Carlis Abbott 1/31.  2/4 stat u/s showed recurrent PSA with active bleeding into PSA. S/p PSA repair with VAC placement.  2/15 VAC change with sartorious muscle flap.  2/20, went to the OR for washout and wound VAC placement. 2/27, underwent wound VAC change in OR.  Tentative plan of wound VAC change at bedside on 3/1. Wound culture showed polymicrobial growth showing moderate Morganella and Enterococcus with few  pseudomonas.   Completed IV Zosyn per vascular surgery Okay for discharge to CIR from vascular standpoint.  Wound VAC changes Mondays and Thursdays     AKI on CKD stage 3b Baseline creatinine 1.3-1.5.  Renal function today with serum cr at 1,36 with K at 3,8 and serum bicarbonate at 33. With Mg at 2,1 Plan to continue close follow up of renal function and electrolytes.    Chronic anemia Severe iron deficiency Secondary to blood loss and iron deficiency given IV iron 2/18 Hemoglobin stable   Cerumen impaction Usually addressed by outpatient specialist with scope and tweezers.  Inpatient attempts failed.  Follow-up as an outpatient.     Subjective: Patient is out of bed  to the chair, she is feeling better every day, still not back to her baseline. She is waiting for CIR.  Physical Exam: Vitals:   02/15/23 0015 02/15/23 0223 02/15/23 0834 02/15/23 1222  BP: 120/64 127/73 123/61 (!) 112/58  Pulse: 70 71 71 71  Resp: '18 17 18 19  '$ Temp: 98.4 F (36.9 C) 98 F (36.7 C)  98.3 F (36.8 C) 98.4 F (36.9 C)  TempSrc: Oral Oral Oral Oral  SpO2: 96% 98% 95% 97%  Weight:      Height:       Neurology awake and alert ENT with mild pallor Cardiovascular with S1 and S2 present and rhythmic with no gallops, rubs or murmurs No JVD Trace lower extremity edema at the ankles Respiratory with no rales or wheezing Abdomen with no distention  Data Reviewed:    Family Communication: no family at the bedside   Disposition: Status is: Inpatient Remains inpatient appropriate because: pending CIR  Planned Discharge Destination: Rehab      Author: Tawni Millers, MD 02/15/2023 1:32 PM  For on call review [email or web address].

## 2023-02-16 LAB — CBC
HCT: 34.7 % — ABNORMAL LOW (ref 36.0–46.0)
Hemoglobin: 10.1 g/dL — ABNORMAL LOW (ref 12.0–15.0)
MCH: 30.3 pg (ref 26.0–34.0)
MCHC: 29.1 g/dL — ABNORMAL LOW (ref 30.0–36.0)
MCV: 104.2 fL — ABNORMAL HIGH (ref 80.0–100.0)
Platelets: 300 10*3/uL (ref 150–400)
RBC: 3.33 MIL/uL — ABNORMAL LOW (ref 3.87–5.11)
RDW: 19.4 % — ABNORMAL HIGH (ref 11.5–15.5)
WBC: 6 10*3/uL (ref 4.0–10.5)
nRBC: 0 % (ref 0.0–0.2)

## 2023-02-16 LAB — GLUCOSE, CAPILLARY
Glucose-Capillary: 109 mg/dL — ABNORMAL HIGH (ref 70–99)
Glucose-Capillary: 164 mg/dL — ABNORMAL HIGH (ref 70–99)
Glucose-Capillary: 156 mg/dL — ABNORMAL HIGH (ref 70–99)
Glucose-Capillary: 149 mg/dL — ABNORMAL HIGH (ref 70–99)

## 2023-02-16 LAB — BASIC METABOLIC PANEL
Anion gap: 10 (ref 5–15)
BUN: 52 mg/dL — ABNORMAL HIGH (ref 8–23)
CO2: 29 mmol/L (ref 22–32)
Calcium: 10 mg/dL (ref 8.9–10.3)
Chloride: 103 mmol/L (ref 98–111)
Creatinine, Ser: 1.4 mg/dL — ABNORMAL HIGH (ref 0.44–1.00)
GFR, Estimated: 38 mL/min — ABNORMAL LOW (ref >60)
Glucose, Bld: 121 mg/dL — ABNORMAL HIGH (ref 70–99)
Potassium: 3.8 mmol/L (ref 3.5–5.1)
Sodium: 142 mmol/L (ref 135–145)

## 2023-02-16 LAB — MAGNESIUM: Magnesium: 2.2 mg/dL (ref 1.7–2.4)

## 2023-02-16 NOTE — Progress Notes (Addendum)
Inpatient Rehabilitation Admissions Coordinator   I met with patient and spouse at bedside to review denial for CIR admit by MD with [facility]. They are in agreement to begin appeal which I will initiate today. I also reviewed insurance coverage for CIR vs SNF. Spouse would like to meet with Dr Unk Lightning and his wife to discuss her care moving forward. I have updated acute team, TOC Case manager and SW of their request.  Danne Baxter, RN, MSN Rehab Admissions Coordinator [phone] 02/16/2023 11:16 AM

## 2023-02-16 NOTE — Progress Notes (Addendum)
  Progress Note    02/16/2023 2:51 PM 10 Days Post-Op  Subjective:  Depressed she was refused CIR - appeals process pending   Vitals:   02/16/23 0854 02/16/23 1202  BP: 115/65 (!) 125/57  Pulse: 69 70  Resp: 18 18  Temp: 97.9 F (36.6 C) 97.7 F (36.5 C)  SpO2: 97% 98%   Physical Exam: Lungs:  non labored Incisions:  R groin with vac in place Extremities:  feet warm with motor and sensation intact Neurologic: A&O  CBC    Component Value Date/Time   WBC 6.0 02/16/2023 0515   RBC 3.33 (L) 02/16/2023 0515   HGB 10.1 (L) 02/16/2023 0515   HGB 14.2 10/09/2020 0935   HCT 34.7 (L) 02/16/2023 0515   HCT 44.9 10/09/2020 0935   PLT 300 02/16/2023 0515   PLT 379 10/09/2020 0935   MCV 104.2 (H) 02/16/2023 0515   MCV 91 10/09/2020 0935   MCH 30.3 02/16/2023 0515   MCHC 29.1 (L) 02/16/2023 0515   RDW 19.4 (H) 02/16/2023 0515   RDW 13.4 10/09/2020 0935   LYMPHSABS 0.9 01/15/2023 0950   LYMPHSABS 1.1 10/09/2020 0935   MONOABS 1.4 (H) 01/15/2023 0950   EOSABS 0.6 (H) 01/15/2023 0950   EOSABS 0.2 10/09/2020 0935   BASOSABS 0.1 01/15/2023 0950   BASOSABS 0.1 10/09/2020 0935    BMET    Component Value Date/Time   NA 142 02/16/2023 0515   NA 145 (H) 07/07/2022 1007   K 3.8 02/16/2023 0515   CL 103 02/16/2023 0515   CO2 29 02/16/2023 0515   GLUCOSE 121 (H) 02/16/2023 0515   BUN 52 (H) 02/16/2023 0515   BUN 24 07/07/2022 1007   CREATININE 1.40 (H) 02/16/2023 0515   CREATININE 1.28 (H) 01/14/2022 1551   CALCIUM 10.0 02/16/2023 0515   GFRNONAA 38 (L) 02/16/2023 0515   GFRAA 49 (L) 10/09/2020 0933    INR    Component Value Date/Time   INR 1.4 (H) 01/10/2023 2145     Intake/Output Summary (Last 24 hours) at 02/16/2023 1451 Last data filed at 02/16/2023 1442 Gross per 24 hour  Intake --  Output 1565 ml  Net -1565 ml      Assessment/Plan:  82 y.o. female with R groin wound 10 Days Post-Op   Appreciate M-Th wound vac changes.  Pt would benefit from inpt rehab  from vascular surgery perspective.  Husband understands after inpt rehab, will be discharged home with nursing for [facility] changes.  No further recommendations at this time.    Broadus John MD [facility] [phone] 02/16/2023 2:51 PM  - I updated her husband regarding the above. He was very upset, stating he and his wife feel the health care providers and the [facility] system are against them. This could not be further from the truth. She has moved floors and rooms three times in an effort to address family concerns and needs.  I reminded him Sommar arrived with a mortal problem and is now on the verge discharge. I am amazed by her progress. With her age and initial clinical status she continues to defy the odds.

## 2023-02-16 NOTE — Progress Notes (Addendum)
Progress Note   Patient: Natasha Woodard [id number] DOB: 29-Nov-1941 DOA: 01/10/2023     36 DOS: the patient was seen and examined on 02/16/2023   Brief hospital course: 82 year old woman presented with chest pain.  Found to have acute lateral wall MI secondary to distal left circumflex occlusion by cardiac catheterization, complicated by severe ischemic MR and acute systolic heart failure with low output.  Required placement of IABP which was later removed secondary to concern for limb ischemia.  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  Subsequently suffered a ventricular tachycardia arrest, ICD was placed, several trips to the OR for groin washout, debridement and wound VAC placement. Now ready for CIR.  1/31 Underwent thrombin injection to right femoral PSA . Hgb drifting down 10.3>8.3>6.8  2/1 Started on amio drip to suppress PVCs. Got dose of IV lasix x1. CVP low.  2/4 Ruptured PSA. Returned to OR for repair. VAC in place 2/5 Midodrine 5 mg three times daily added.  Back in A fib.  2/7 TEE with mod MR.  2/8 VT arrest--> shock x1 ROSC after 2 minutes. Given lidocaine and amio was increased to 60 mg.  2/9 Transvenous pacemaker placed for overdrive pacing with PMVT.  2/11 Milrinone restarted, Co-ox 49% 2/12 PICC removed. S/p [employer] Sci ICD  2/14 Milrinone stopped . Amio drip stopped.  2/15 Returned to OR for washout and VAC change with sartorious muscle flap.  2/20 OR for washout and wound vac placement.   2/26 OR groin washout, debridement and vac change  3/5 ready for CIR 03/06 peer to peer call with insurance medical director, and CIR has been declined. Apparently patient's functional and cognitive function is too good to qualify for CIR level of services.   Assessment and Plan: No notes have been filed under this hospital service. Service: Hospitalist  Acute lateral wall myocardial infarction.  Cardiogenic  shock Acute on chronic systolic CHF Severe ischemic MR Status post ICD placement 2/12 Clinically euvolemic.  Continue with losartan, torsemide and spironolactone.    Paroxysmal atrial fibrillation VT arrest 2/7 S/p DCCV x 1 with CPR, ROSC after 2 minutes.  Polymorphic VT, appeared bradycardia-mediated with intermittent junctional rhythm. S/p DDD ICD on 2/12 Continue amiodarone Patient has been hemodynamically stable, Ok to change level of care to telemetry.   Right femoral pseudoaneurysm Right groin wound, secondary infection Patient developed pseudoaneurysm following balloon pump, s/p thrombin injection by Dr. Carlis Abbott 1/31.  2/4 stat u/s showed recurrent PSA with active bleeding into PSA. S/p PSA repair with VAC placement.  2/15 VAC change with sartorious muscle flap.  2/20, went to the OR for washout and wound VAC placement. 2/27, underwent wound VAC change in OR.  Tentative plan of wound VAC change at bedside on 3/1. Wound culture showed polymicrobial growth showing moderate Morganella and Enterococcus with few  pseudomonas.   Completed IV Zosyn per vascular surgery Okay for discharge to CIR from vascular standpoint.  Wound VAC changes Mondays and Thursdays     AKI on CKD stage 3b Baseline creatinine 1.3-1.5.  Stable renal function with serum cr at 1,40 with K at 3,8 and serum bicarbonate at 29,  Na 142.  Plan to continue close follow up of renal function and electrolytes.    Chronic anemia Severe iron deficiency Secondary to blood loss and iron deficiency given IV iron 2/18 Hemoglobin stable   Cerumen impaction Usually addressed by outpatient specialist with scope and tweezers.  Inpatient attempts failed.  Follow-up as an outpatient.       Subjective: Patient with no chest pain or dyspnea, feels disappointed of denial from insurance to cover CIR.   Physical Exam: Vitals:   02/15/23 2021 02/16/23 0700 02/16/23 0854 02/16/23 1202  BP: 132/63 123/64 115/65 (!) 125/57   Pulse: 71  69 70  Resp: '18 18 18 18  '$ Temp: 98.9 F (37.2 C) 98.5 F (36.9 C) 97.9 F (36.6 C) 97.7 F (36.5 C)  TempSrc: Oral Oral Oral Oral  SpO2: 97%  97% 98%  Weight:      Height:       Neurology awake and alert ENT with no pallor Cardiovascular with S1 and S2 present and rhythmic with no gallops, rubs or murmurs Respiratory with no rales or wheezing Abdomen with no distention  No lower extremity edema  Data Reviewed:    Family Communication: no family at the bedside   Disposition: Status is: Inpatient Remains inpatient appropriate because: pending transfer to CIR, family is requesting an appeal.   Planned Discharge Destination: Rehab    Author: Tawni Millers, MD 02/16/2023 3:35 PM  For on call review [email or web address].

## 2023-02-16 NOTE — Consult Note (Signed)
[facility] Nurse wound follow up Wound type:Surgical Measurement:Per Monday Wound bed:95% beefy red, 5% yellow fibrinous slough Drainage (amount, consistency, odor) small serosanguinous Periwound:intact Dressing procedure/placement/frequency:Removed NPWT dressing (1 piece white foam, 1 piece black foam). Wound cleansed with NS, and periwound patted dry. Skim barrier ring stretch to encircle wound, 1 piece black foam used as wound contact layer and drape applied. Dressing attached to 126mHg continuous negative pressure and an immediate seal achieved. Patient tolerated procedure well. Bedside RN A. Davis assisted with the NPWT dressing change by holding the patient's pannus up and her efforts were appreciated.  Next dressing change is Monday, 02/20/23.  Supplies (3 large VAC dressing kits and 4 skin barrier rings and 1 cannister) have been ordered by the unit secretary and will be placed at the patient's bedside for future use.  [facility]ursing team will follow, and will remain available to this patient, the nursing and medical teams.    Thank you for inviting [facility] participate in this patient's Plan of Care.  LMaudie Flakes MSN, RN, CNS, [place] CSerita Grammes [employer] FUnis[employer] Corporationphone:  [phone]

## 2023-02-16 NOTE — Progress Notes (Signed)
Inpatient Rehabilitation Admissions Coordinator  I have received a denial for CIR admit from [facility] Medicare MD. [facility] Medicare determined that she does not need that level of rehab at this time. I have offered to pursue expedited appeal. I met with patient at bedside and spoke with her spouse by phone. Spouse, patient and I to meet at 10 am to discuss next steps. Patient's RN present at bedside and aware. I have alerted care team.  Danne Baxter, RN, MSN Rehab Admissions Coordinator [phone] 02/16/2023 8:56 AM

## 2023-02-17 LAB — CBC
HCT: 34.2 % — ABNORMAL LOW (ref 36.0–46.0)
Hemoglobin: 10.5 g/dL — ABNORMAL LOW (ref 12.0–15.0)
MCH: 31.3 pg (ref 26.0–34.0)
MCHC: 30.7 g/dL (ref 30.0–36.0)
MCV: 102.1 fL — ABNORMAL HIGH (ref 80.0–100.0)
Platelets: 290 10*3/uL (ref 150–400)
RBC: 3.35 MIL/uL — ABNORMAL LOW (ref 3.87–5.11)
RDW: 19.1 % — ABNORMAL HIGH (ref 11.5–15.5)
WBC: 5.7 10*3/uL (ref 4.0–10.5)
nRBC: 0 % (ref 0.0–0.2)

## 2023-02-17 LAB — GLUCOSE, CAPILLARY
Glucose-Capillary: 141 mg/dL — ABNORMAL HIGH (ref 70–99)
Glucose-Capillary: 123 mg/dL — ABNORMAL HIGH (ref 70–99)
Glucose-Capillary: 112 mg/dL — ABNORMAL HIGH (ref 70–99)
Glucose-Capillary: 131 mg/dL — ABNORMAL HIGH (ref 70–99)

## 2023-02-17 LAB — BASIC METABOLIC PANEL
Anion gap: 9 (ref 5–15)
BUN: 47 mg/dL — ABNORMAL HIGH (ref 8–23)
CO2: 30 mmol/L (ref 22–32)
Calcium: 9.8 mg/dL (ref 8.9–10.3)
Chloride: 103 mmol/L (ref 98–111)
Creatinine, Ser: 1.41 mg/dL — ABNORMAL HIGH (ref 0.44–1.00)
GFR, Estimated: 37 mL/min — ABNORMAL LOW (ref >60)
Glucose, Bld: 108 mg/dL — ABNORMAL HIGH (ref 70–99)
Potassium: 4 mmol/L (ref 3.5–5.1)
Sodium: 142 mmol/L (ref 135–145)

## 2023-02-17 LAB — MAGNESIUM: Magnesium: 2.1 mg/dL (ref 1.7–2.4)

## 2023-02-17 NOTE — Progress Notes (Signed)
Progress Note   Patient: Natasha Woodard [id number] DOB: 10-13-1941 DOA: 01/10/2023     37 DOS: the patient was seen and examined on 02/17/2023   Brief hospital course: 82 year old woman presented with chest pain.  Found to have acute lateral wall MI secondary to distal left circumflex occlusion by cardiac catheterization, complicated by severe ischemic MR and acute systolic heart failure with low output.  Required placement of IABP which was later removed secondary to concern for limb ischemia.  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  Subsequently suffered a ventricular tachycardia arrest, ICD was placed, several trips to the OR for groin washout, debridement and wound VAC placement. Now ready for CIR.  1/31 Underwent thrombin injection to right femoral PSA . Hgb drifting down 10.3>8.3>6.8  2/1 Started on amio drip to suppress PVCs. Got dose of IV lasix x1. CVP low.  2/4 Ruptured PSA. Returned to OR for repair. VAC in place 2/5 Midodrine 5 mg three times daily added.  Back in A fib.  2/7 TEE with mod MR.  2/8 VT arrest--> shock x1 ROSC after 2 minutes. Given lidocaine and amio was increased to 60 mg.  2/9 Transvenous pacemaker placed for overdrive pacing with PMVT.  2/11 Milrinone restarted, Co-ox 49% 2/12 PICC removed. S/p [employer] Sci ICD  2/14 Milrinone stopped . Amio drip stopped.  2/15 Returned to OR for washout and VAC change with sartorious muscle flap.  2/20 OR for washout and wound vac placement.   2/26 OR groin washout, debridement and vac change  3/5 ready for CIR 03/06 peer to peer call with insurance medical director, and CIR has been declined. Apparently patient's functional and cognitive function is too good to qualify for CIR level of services.   Assessment and Plan: No notes have been filed under this hospital service. Service: Hospitalist   Acute lateral wall myocardial infarction.  Cardiogenic  shock Acute on chronic systolic CHF Severe ischemic MR Status post ICD placement 2/12 Clinically euvolemic.  Continue with losartan, torsemide and spironolactone.    Paroxysmal atrial fibrillation VT arrest 2/7 S/p DCCV x 1 with CPR, ROSC after 2 minutes.  Polymorphic VT, appeared bradycardia-mediated with intermittent junctional rhythm. S/p DDD ICD on 2/12 Continue amiodarone Patient has been hemodynamically stable, Ok to change level of care to telemetry.    Right femoral pseudoaneurysm Right groin wound, secondary infection Patient developed pseudoaneurysm following balloon pump, s/p thrombin injection by Dr. Carlis Abbott 1/31.  2/4 stat u/s showed recurrent PSA with active bleeding into PSA. S/p PSA repair with VAC placement.  2/15 VAC change with sartorious muscle flap.  2/20, went to the OR for washout and wound VAC placement. 2/27, underwent wound VAC change in OR.  Tentative plan of wound VAC change at bedside on 3/1. Wound culture showed polymicrobial growth showing moderate Morganella and Enterococcus with few  pseudomonas.   Completed IV Zosyn per vascular surgery Okay for discharge to CIR from vascular standpoint.  Wound VAC changes Mondays and Thursdays     AKI on CKD stage 3b Baseline creatinine 1.3-1.5.  Stable renal function with serum cr at 1,40 with K at 3,8 and serum bicarbonate at 29,  Na 142.  Plan to continue close follow up of renal function and electrolytes.    Chronic anemia Severe iron deficiency Secondary to blood loss and iron deficiency given IV iron 2/18 Hemoglobin stable   Cerumen impaction Usually addressed by outpatient specialist with scope and tweezers.  Inpatient attempts  failed.  Follow-up as an outpatient.     Subjective: Patient is feeling better and she has been working with PT and OT  Physical Exam: Vitals:   02/17/23 0358 02/17/23 0739 02/17/23 1122 02/17/23 1303  BP: 116/65 125/77 (!) 79/66 111/61  Pulse: 72 75 72 70  Resp: '16 18 15    '$ Temp: (!) 97 F (36.1 C) 98.2 F (36.8 C) 98.7 F (37.1 C)   TempSrc: Oral Oral Oral   SpO2: 98% 95% 97% 94%  Weight:      Height:       Neurology awake and alert ENT with mild pallor Cardiovascular with S1 and S2 present and rhythmic Respiratory with no rales or wheezing Abdomen with no distention  No lower extremity edema  Data Reviewed:    Family Communication: her grandson is at the bedside   Disposition: Status is: Inpatient Remains inpatient appropriate because: pending transfer to CIR  Planned Discharge Destination: Rehab      Author: Tawni Millers, MD 02/17/2023 5:17 PM  For on call review [email or web address].

## 2023-02-17 NOTE — Progress Notes (Signed)
Nutrition Follow-up  DOCUMENTATION CODES:   Non-severe (moderate) malnutrition in context of acute illness/injury  INTERVENTION:   Continue Ensure Enlive po TID, each supplement provides 350 kcal and 20 grams of protein. Continue 1 packet Juven BID, each packet provides 95 calories, 2.5 grams of protein (collagen), and 9.8 grams of carbohydrate (3 grams sugar); also contains 7 grams of L-arginine and L-glutamine, 300 mg vitamin C, 15 mg vitamin E, 1.2 mcg vitamin B-12, 9.5 mg zinc, 200 mg calcium, and 1.5 g  Calcium Beta-hydroxy-Beta-methylbutyrate to support wound healing Continue Multivitamin w/ minerals daily Encourage good PO intake  NUTRITION DIAGNOSIS:   Moderate Malnutrition related to acute illness as evidenced by mild fat depletion, mild muscle depletion, moderate muscle depletion. - Ongoing   GOAL:   Patient will meet greater than or equal to 90% of their needs - Ongoing   MONITOR:   PO intake, Supplement acceptance, Labs, Weight trends  REASON FOR ASSESSMENT:   Consult Assessment of nutrition requirement/status  ASSESSMENT:   82 yo female admitted with NSTEMI requiring IABP, pt with bleed and impaired circulation with IABP removed and thrombin injection to pseudoaneurysm with subsequent rupture requiring repair and wound VAC placement. PMH includes NICM, CAD, DM, HLD, hiatal hermia with refux, Ichthyosis congenita, CKD  1/30 Admitted, IABP 1/31 Thrombin injection to right femoral PSA 2/04 Ruptured PSA-OR for repair 2/08 VT arrest 2/12 ICD placed 2/16 R groin washout with sartorius muscle flap and placement of wound VAC 2/20 s/p R groin washout, debridement, myriad morcell placement, VAC placement 2/26 s/p R going washout, debridement, myriad morcell placement, wound VAC placement  Met with pt in room. NT at bedside. Pt has been eating well, drinking both the Ensure and the Juven. Shares that she loves the tomato soup and gets it almost every single day. Denies  any nausea or vomiting. Did not have any questions for RD at this time.   Ongoing discharge planning, possible discharge to CIR.  Meal Intake 2/22-2/27: 0-100% x 8 meals (average 41%) 3/02-3/06: 50-100% x 5 meals (average 78%)  Medications reviewed and include: NovoLog SSI, MVI, Spironolactone, Torsemide Labs reviewed: Sodium 142, Potassium 4.0, BUN 47, Creatinine 1.41, Magnesium 2.1, 24 hr CBGs 141-164  Diet Order:   Diet Order             Diet regular Room service appropriate? Yes; Fluid consistency: Thin  Diet effective now                   EDUCATION NEEDS:   Not appropriate for education at this time  Skin:  Skin Assessment: Skin Integrity Issues: Skin Integrity Issues:: Wound VAC, Other (Comment) Wound Vac: R groin Other: ichthyosis congenita since birth  Last BM:  3/8 - Type 3  Height:  Ht Readings from Last 1 Encounters:  02/06/23 '5\' 2"'$  (1.575 m)   Weight:  Wt Readings from Last 1 Encounters:  02/12/23 67 kg   Ideal Body Weight:  50 kg  BMI:  Body mass index is 27.02 kg/m.  Estimated Nutritional Needs:  Kcal:  1600-1800 kcals Protein:  75-90 g Fluid:  >/= 1.6 L   Hermina Barters RD, LDN Clinical Dietitian See [facility] for contact information.

## 2023-02-17 NOTE — Progress Notes (Signed)
Mobility Specialist Progress Note:   02/17/23 1050  Mobility  Activity Ambulated with assistance in hallway  Level of Assistance Standby assist, set-up cues, supervision of patient - no hands on  Assistive Device Front wheel walker  Distance Ambulated (ft) 330 ft  Activity Response Tolerated well  $Mobility charge 1 Mobility   Pt in chair willing to participate in mobility. No complaints of pain. Required 1 standing rest break d/t fatigue. Left in chair with call bell in reach and all needs met.   Gareth Eagle Merel Santoli Mobility Specialist Please contact via [employer] or  Rehab Office at [phone]

## 2023-02-17 NOTE — Plan of Care (Signed)
  Problem: Education: Goal: Understanding of cardiac disease, CV risk reduction, and recovery process will improve Outcome: Progressing   Problem: Activity: Goal: Ability to tolerate increased activity will improve Outcome: Progressing   Problem: Cardiac: Goal: Ability to achieve and maintain adequate cardiovascular perfusion will improve Outcome: Progressing   Problem: Health Behavior/Discharge Planning: Goal: Ability to safely manage health-related needs after discharge will improve Outcome: Progressing   Problem: Education: Goal: Ability to describe self-care measures that may prevent or decrease complications (Diabetes Survival Skills Education) will improve Outcome: Progressing   Problem: Coping: Goal: Ability to adjust to condition or change in health will improve Outcome: Progressing   Problem: Fluid Volume: Goal: Ability to maintain a balanced intake and output will improve Outcome: Progressing   Problem: Health Behavior/Discharge Planning: Goal: Ability to identify and utilize available resources and services will improve Outcome: Progressing Goal: Ability to manage health-related needs will improve Outcome: Progressing   Problem: Metabolic: Goal: Ability to maintain appropriate glucose levels will improve Outcome: Progressing   Problem: Nutritional: Goal: Maintenance of adequate nutrition will improve Outcome: Progressing Goal: Progress toward achieving an optimal weight will improve Outcome: Progressing   Problem: Skin Integrity: Goal: Risk for impaired skin integrity will decrease Outcome: Progressing   Problem: Tissue Perfusion: Goal: Adequacy of tissue perfusion will improve Outcome: Progressing   Problem: Education: Goal: Knowledge of General Education information will improve Description: Including pain rating scale, medication(s)/side effects and non-pharmacologic comfort measures Outcome: Progressing   Problem: Health Behavior/Discharge  Planning: Goal: Ability to manage health-related needs will improve Outcome: Progressing   Problem: Clinical Measurements: Goal: Ability to maintain clinical measurements within normal limits will improve Outcome: Progressing Goal: Will remain free from infection Outcome: Progressing Goal: Diagnostic test results will improve Outcome: Progressing Goal: Respiratory complications will improve Outcome: Progressing Goal: Cardiovascular complication will be avoided Outcome: Progressing   Problem: Activity: Goal: Risk for activity intolerance will decrease Outcome: Progressing   Problem: Nutrition: Goal: Adequate nutrition will be maintained Outcome: Progressing   Problem: Coping: Goal: Level of anxiety will decrease Outcome: Progressing   Problem: Elimination: Goal: Will not experience complications related to bowel motility Outcome: Progressing Goal: Will not experience complications related to urinary retention Outcome: Progressing   Problem: Pain Managment: Goal: General experience of comfort will improve Outcome: Progressing   Problem: Safety: Goal: Ability to remain free from injury will improve Outcome: Progressing   Problem: Skin Integrity: Goal: Risk for impaired skin integrity will decrease Outcome: Progressing

## 2023-02-17 NOTE — Progress Notes (Signed)
Occupational Therapy Treatment Patient Details Name: Natasha Woodard MRN: [id number] DOB: 06-15-1941 Today's Date: 02/17/2023   History of present illness 82 yo female admitted 1/30 with chest pain, NSTEMI s/p cardiac cath and IABP placed. Pt with bleeding and impaired circulation with IABP removed 1/31 and thrombin injection to pseudoaneurysm. 2/4 Rt groin pseudoaneurysm rupture s/p repair with placement of wound VAC. Pt with VT arrest on 2/8. ICD placed 2/12. Pt with rt groin washout and debridement and sartorius muscle flap with VAC placement on 2/15. PMhx: CAD, dilated cardiomyopathy, CKD, GERD, HFpEF, HTN, HLD, T2DM, ichthyosis.   OT comments  Pt continuing to progress towards patient focused goals. Pt reports she feels like she still needs to get stronger before returning home. Pt completed sit>stand with Supervision + RW, toilet t/f with SBA + RW. Pt was somewhat impulsive during session and did not allow time for line management, likely due to urget need to use bathroom. Pt demonstrated understanding of HEP and complete one set of each designated exercise. Donning the R sock while seated EOB required total A due to pain and post-op incision. Rest breaks were given during exercises due to Pt feeling fatigued. Pt will benefit from continued skilled acute OT services to address above deficits and assist with transition to next level of care. OT continues to recommend inpatient rehab for post acute OT services to improve functional independence.    Recommendations for follow up therapy are one component of a multi-disciplinary discharge planning process, led by the attending physician.  Recommendations may be updated based on patient status, additional functional criteria and insurance authorization.    Follow Up Recommendations  Acute inpatient rehab (3hours/day)     Assistance Recommended at Discharge Frequent or constant Supervision/Assistance  Patient can return home with the following   Assistance with cooking/housework;Direct supervision/assist for medications management;Direct supervision/assist for financial management;Assist for transportation;Help with stairs or ramp for entrance;A little help with walking and/or transfers;A little help with bathing/dressing/bathroom   Equipment Recommendations  None recommended by OT    Recommendations for Other Services      Precautions / Restrictions Precautions Precautions: Fall;ICD/Pacemaker;Other (comment) (LUE ICD) Precaution Comments: wound VAC rt groin, LUE ICD placed 2/12 no lifting LUE past shoulder or L shld resistance exercise x6 weeks Restrictions Weight Bearing Restrictions: No       Mobility Bed Mobility Overal bed mobility: Needs Assistance       Supine to sit: Min guard          Transfers Overall transfer level: Needs assistance Equipment used: Rolling walker (2 wheels) Transfers: Sit to/from Stand Sit to Stand: Supervision                 Balance Overall balance assessment: Needs assistance Sitting-balance support: Feet supported, No upper extremity supported Sitting balance-Leahy Scale: Good Sitting balance - Comments: no UE support with static sitting   Standing balance support: No upper extremity supported, Reliant on assistive device for balance Standing balance-Leahy Scale: Poor                             ADL either performed or assessed with clinical judgement   ADL Overall ADL's : Needs assistance/impaired     Grooming: Wash/dry hands;Standing;Supervision/safety Grooming Details (indicate cue type and reason): Standing at sink, Supervision with BUEs unsupported             Lower Body Dressing: Supervision/safety;Sitting/lateral leans Lower Body Dressing Details (indicate cue  type and reason): Max A to don R sock seated EOB, Pt donned L sock using figure 4 technique with Supervision Toilet Transfer: Rolling walker (2 wheels);Cueing for safety Toilet Transfer  Details (indicate cue type and reason): Pt complete toilet t/f with SBA + RW. Needed cueing to slow down pace to allow for line/lead management. Toileting- Clothing Manipulation and Hygiene: Supervision/safety;Sit to/from stand       Functional mobility during ADLs: Min guard;Rolling walker (2 wheels)      Extremity/Trunk Assessment Upper Extremity Assessment LUE Deficits / Details: generalized weakness   Lower Extremity Assessment Lower Extremity Assessment: Defer to PT evaluation   Cervical / Trunk Assessment Cervical / Trunk Assessment: Normal    Vision   Vision Assessment?: No apparent visual deficits   Perception Perception Perception: Not tested   Praxis Praxis Praxis: Intact    Cognition Arousal/Alertness: Awake/alert Behavior During Therapy: WFL for tasks assessed/performed Overall Cognitive Status: Within Functional Limits for tasks assessed Area of Impairment: Safety/judgement                   Current Attention Level: Sustained   Following Commands: Follows one step commands consistently Safety/Judgement: Decreased awareness of safety     General Comments: Pt somewhat impulsive. Attempted sit>stand before lines/leads were managed, likely due to need to use bathroom        Exercises General Exercises - Upper Extremity Shoulder Flexion: Strengthening, 10 reps, Seated, Theraband, AROM, Right Theraband Level (Shoulder Flexion): Level 1 (Yellow) Shoulder Extension: Strengthening, Both, Seated, Theraband, AROM, 10 reps Theraband Level (Shoulder Extension): Level 1 (Yellow) Shoulder Horizontal ABduction: Strengthening, Both, 10 reps, Seated, Theraband, AROM Theraband Level (Shoulder Horizontal Abduction): Level 1 (Yellow) Shoulder Exercises Shoulder External Rotation: Strengthening, Both, 10 reps, Seated, Theraband, AROM Theraband Level (Shoulder External Rotation): Level 1 (Yellow)    Shoulder Instructions       General Comments  Pt is hard of  hearing, likes for others to speak up when conversating.     Pertinent Vitals/ Pain       Pain Assessment Pain Assessment: Faces Faces Pain Scale: Hurts a little bit Breathing: normal Pain Location: L leg in figure four position Pain Descriptors / Indicators: Discomfort Pain Intervention(s): Monitored during session, Limited activity within patient's tolerance  Home Living                                          Prior Functioning/Environment              Frequency  Min 2X/week        Progress Toward Goals  OT Goals(current goals can now be found in the care plan section)  Progress towards OT goals: Progressing toward goals  Acute Rehab OT Goals OT Goal Formulation: With patient Time For Goal Achievement: 02/24/23 Potential to Achieve Goals: Good  Plan Discharge plan remains appropriate;Frequency remains appropriate    Co-evaluation                 AM-PAC OT "6 Clicks" Daily Activity     Outcome Measure   Help from another person eating meals?: None Help from another person taking care of personal grooming?: A Little Help from another person toileting, which includes using toliet, bedpan, or urinal?: A Little Help from another person bathing (including washing, rinsing, drying)?: A Lot Help from another person to put on and taking off regular upper  body clothing?: A Little Help from another person to put on and taking off regular lower body clothing?: A Lot 6 Click Score: 17    End of Session Equipment Utilized During Treatment: Rolling walker (2 wheels)  OT Visit Diagnosis: Other abnormalities of gait and mobility (R26.89);Muscle weakness (generalized) (M62.81);Pain Pain - Right/Left: Right Pain - part of body: Leg   Activity Tolerance Patient tolerated treatment well   Patient Left Other (comment) (Sitting EOB with PT at bedside)   Nurse Communication Mobility status        Time: [id number] OT Time Calculation (min): 21  min  Charges: OT General Charges $OT Visit: 1 Visit OT Treatments $Therapeutic Activity: 8-22 mins  02/17/2023  AB, OTR/L  Acute Rehabilitation Services  Office: [phone]   Cori Razor 02/17/2023, 5:02 PM

## 2023-02-17 NOTE — Progress Notes (Signed)
Physical Therapy Treatment Patient Details Name: Natasha Woodard MRN: [id number] DOB: Jul 01, 1941 Today's Date: 02/17/2023   History of Present Illness 82 yo female admitted 1/30 with chest pain, NSTEMI s/p cardiac cath and IABP placed. Pt with bleeding and impaired circulation with IABP removed 1/31 and thrombin injection to pseudoaneurysm. 2/4 Rt groin pseudoaneurysm rupture s/p repair with placement of wound VAC. Pt with VT arrest on 2/8. ICD placed 2/12. Pt with rt groin washout and debridement and sartorius muscle flap with VAC placement on 2/15. PMhx: CAD, dilated cardiomyopathy, CKD, GERD, HFpEF, HTN, HLD, T2DM, ichthyosis.    PT Comments    Pt received seated EOB in care of OT, pt agreeable to therapy session with emphasis on gait and stair negotiation. Pt reliant on BUE support for gait and stair tasks, needing safety and sequencing cues and up to min guard assist. Pt with short term memory deficits but less fatigued after stairs this date than in previous session. Pt agreeable to sit up in chair, chair alarm on for safety at end of session and pt grandson entering the room to visit with her. Pt continues to benefit from PT services to progress toward functional mobility goals.    Recommendations for follow up therapy are one component of a multi-disciplinary discharge planning process, led by the attending physician.  Recommendations may be updated based on patient status, additional functional criteria and insurance authorization.  Follow Up Recommendations  Acute inpatient rehab (3hours/day)     Assistance Recommended at Discharge Frequent or constant Supervision/Assistance  Patient can return home with the following A little help with walking and/or transfers;A little help with bathing/dressing/bathroom;Help with stairs or ramp for entrance;Assist for transportation;Assistance with cooking/housework   Equipment Recommendations  Rolling walker (2 wheels)    Recommendations for  Other Services       Precautions / Restrictions Precautions Precautions: Fall;ICD/Pacemaker;Other (comment) Precaution Comments: wound VAC rt groin, LUE ICD placed 2/12 no lifting LUE past shoulder or L shld resistance exercise x6 weeks Restrictions Weight Bearing Restrictions: No Other Position/Activity Restrictions: see PT precs for LUE precs     Mobility  Bed Mobility               General bed mobility comments: pt received seated EOB with OT present    Transfers Overall transfer level: Needs assistance Equipment used: Rolling walker (2 wheels) Transfers: Sit to/from Stand Sit to Stand: Supervision           General transfer comment: cues for safety and UE placement    Ambulation/Gait Ambulation/Gait assistance: Min guard Gait Distance (Feet): 50 Feet Assistive device: Rolling walker (2 wheels) Gait Pattern/deviations: Step-through pattern, Trunk flexed, Drifts right/left, Decreased stride length       General Gait Details: min guard for safety and line assist, pt with poor awareness of lines and needs dense safety cues; also postural cues needed   Stairs Stairs: Yes Stairs assistance: Min guard Stair Management: Step to pattern, Forwards, With walker Number of Stairs: 10 General stair comments: single 7" platform step in room x10 reps in a row with BUE support to simulate B railings   Wheelchair Mobility    Modified Rankin (Stroke Patients Only)       Balance Overall balance assessment: Needs assistance Sitting-balance support: Feet supported, No upper extremity supported Sitting balance-Leahy Scale: Good Sitting balance - Comments: no UE support with static sitting   Standing balance support: No upper extremity supported, Reliant on assistive device for balance Standing balance-Leahy Scale:  Poor Standing balance comment: reliant on RW for dynamic tasks                            Cognition Arousal/Alertness:  Awake/alert Behavior During Therapy: WFL for tasks assessed/performed Overall Cognitive Status: Within Functional Limits for tasks assessed Area of Impairment: Safety/judgement                   Current Attention Level: Sustained   Following Commands: Follows one step commands consistently Safety/Judgement: Decreased awareness of safety     General Comments: HoH, needs reminders to wait for therapist to arrange lines prior to proceeding.        Exercises Other Exercises Other Exercises: seated BLE AROM:  hip flexion, LAQ x10 reps ea    General Comments General comments (skin integrity, edema, etc.): pt requesting soda for GI discomfort, secretary paged at end of session to notify NT, RN was busy on the phone.      Pertinent Vitals/Pain Pain Assessment Pain Assessment: Faces Faces Pain Scale: Hurts little more Pain Location: "belly discomfort, not pain" Pain Descriptors / Indicators: Discomfort, Guarding Pain Intervention(s): Monitored during session, Limited activity within patient's tolerance, Repositioned, Other (comment) (pt requesting something for abdominal/GI type discomfort, pt states it is not pain however)     PT Goals (current goals can now be found in the care plan section) Acute Rehab PT Goals Patient Stated Goal: to get stronger at therapy prior to home PT Goal Formulation: With patient Time For Goal Achievement: 02/23/23 Progress towards PT goals: Progressing toward goals    Frequency    Min 3X/week      PT Plan Current plan remains appropriate       AM-PAC PT "6 Clicks" Mobility   Outcome Measure  Help needed turning from your back to your side while in a flat bed without using bedrails?: A Little Help needed moving from lying on your back to sitting on the side of a flat bed without using bedrails?: A Little Help needed moving to and from a bed to a chair (including a wheelchair)?: A Little Help needed standing up from a chair using  your arms (e.g., wheelchair or bedside chair)?: A Little Help needed to walk in hospital room?: A Little Help needed climbing 3-5 steps with a railing? : A Little 6 Click Score: 18    End of Session Equipment Utilized During Treatment: Gait belt Activity Tolerance: Patient tolerated treatment well;Patient limited by pain Patient left: in chair;with call bell/phone within reach;with chair alarm set;with family/visitor present;Other (comment) (grandson present) Nurse Communication: Mobility status;Other (comment) (wants a soda) PT Visit Diagnosis: Unsteadiness on feet (R26.81);Other abnormalities of gait and mobility (R26.89);Muscle weakness (generalized) (M62.81)     Time: [id number] PT Time Calculation (min) (ACUTE ONLY): 16 min  Charges:  $Gait Training: 8-22 mins                     Pedrohenrique Mcconville P., PTA Acute Rehabilitation Services Secure Chat Preferred 9a-5:30pm Office: [place] 02/17/2023, 6:25 PM

## 2023-02-17 NOTE — Progress Notes (Signed)
Inpatient Rehabilitation Admissions Coordinator   I await appeal determination with [facility] for possible CIR admit.  Danne Baxter, RN, MSN Rehab Admissions Coordinator [phone] 02/17/2023 8:33 AM

## 2023-02-17 NOTE — Progress Notes (Signed)
Inpatient Rehabilitation Admissions Coordinator   We have won appeal for CIR admit with [facility]. I met with at bedside with patient and contacted her spouse by phone. They are aware and in agreement. We reviewed again cost of CIR admit/Copays. We can plan admit Monday when next bed available or sooner this weekend if beds becomes available Acute team and MDs made aware.  Danne Baxter, RN, MSN Rehab Admissions Coordinator [phone] 02/17/2023 3:27 PM

## 2023-02-18 LAB — GLUCOSE, CAPILLARY
Glucose-Capillary: 104 mg/dL — ABNORMAL HIGH (ref 70–99)
Glucose-Capillary: 104 mg/dL — ABNORMAL HIGH (ref 70–99)
Glucose-Capillary: 190 mg/dL — ABNORMAL HIGH (ref 70–99)
Glucose-Capillary: 103 mg/dL — ABNORMAL HIGH (ref 70–99)
Glucose-Capillary: 143 mg/dL — ABNORMAL HIGH (ref 70–99)

## 2023-02-18 MED ORDER — CLOTRIMAZOLE 1 % VA CREA
1.0000 | TOPICAL_CREAM | Freq: Every day | VAGINAL | Status: DC
  Administered 2023-02-18: 1 via VAGINAL
  Filled 2023-02-18: qty 45

## 2023-02-18 NOTE — H&P (Signed)
Physical Medicine and Rehabilitation Admission H&P    Chief Complaint  Patient presents with   Chest Pain   Shortness of Breath  : HPI:  Natasha Woodard is a 82 y.o. female who presented on January 10, 2023 with shortness of breath and chest pain. She was admitted with an acute lateral wall MI and was also found to have significant akinesis/hypokinesis with severe mitral regurgitation.  An IABP was placed in the Cath Lab.  Unfortunately, overnight she developed a hematoma to the IABP access site with associated limb ischemia and the IABP was removed.  Patient was seen by vascular surgery for assessment of a right femoral pseudoaneurysm and a thrombin injection was performed.  On 01/15/2023 patient developed swelling in her right groin due to a significant hematoma which rapidly extended down the leg.  Patient underwent emergent right groin exploration and hematoma evacuation with right profunda femoris repair by vascular surgery.  On 01/19/2023 patient went into VT arrest in 01/20/2019 for a transvenous pacemaker placed with an ICD placed on 2/12.  On 01/24/2023 patient had her staples removed and a wound VAC replaced in the right groin area due to incisional breakdown and underlying tissue necrosis.  She went to the OR on 2/15 for washout, debridement, sartorious muscle flap and VAC replacement by Dr. Virl Cagey.  She has been followed essentially weekly by vascular surgery for VAC changes as well as wound washouts in the OR, the most recent of which was on February 06, 2023.  VAC changes have been converted to bedside and now wound care ostomy nurse is managing the VAC changes which are Monday and Thursdays. Wound cultures showed polymicrobial growth with moderate Morganella, Enterococcus and Pseudomonas. Pt has completed a course of IV Zosyn.  Patient has been up with PT and OT addressing self-care and mobility.  Most recently she has been min-guard assist for sit to stand transfers and min guard assist 30  feet x2  for gait needing cues for safety, posture.     Review of Systems  Constitutional:  Negative for chills and fever.  HENT:  Negative for tinnitus.   Eyes: Negative.   Respiratory:  Positive for shortness of breath.   Cardiovascular:  Negative for chest pain.  Gastrointestinal:  Negative for nausea and vomiting.  Genitourinary:  Negative for dysuria.  Musculoskeletal:  Positive for back pain and myalgias.  Skin:  Positive for itching.  Neurological:  Positive for weakness.  Psychiatric/Behavioral:  Negative for depression.    Past Medical History:  Diagnosis Date   AICD (automatic cardioverter/defibrillator) present    Calcific tendonitis 2008   Treatment of left leg   Cardiomyopathy ([place]) 06/2017   a) Echo: EF 25-30%. GR 1-2 DD w/ elevated LVEDP. Mild valvular Dz; b) Cardiac MRI 2/'19: frequent PVCs (diffiuclt to interpret) - EF ~27% w/ diffuse HK.  No evidence of infarct, infiltrative Dz or myocarditis. -- ? if related to PVCs. c) f/u Echo 6/'19: EF 35-40%. Gr 1 DD. Diffuse HK.-> d) Jan 2020 EF 50 to 55%.  Mod MAC, mod LAE.  Ao Sclerosis; e) Echo 1/'21 - EF 60-65%, Gr II DD. Mild Mod MR.    Chronic combined systolic and diastolic CHF, NYHA class 2 and ACA/AHA stage C    Now essentially resolved, back to simply diastolic CHF   Coronary artery disease, non-occlusive    mild-moderate CAD 06/30/17 cath   CTS (carpal tunnel syndrome)    Diabetes mellitus    Type II  Dysrhythmia    patient said that she cant remember what it is   Frequent unifocal PVCs 01/2018   Event Monitor: NSR, W/ frequent multifocal PVCs (9%-down from 30% prior to amiodarone) and PACs.  Nighttime bradycardia suggestive of OSA.';  Zio patch September 2023: PVC burden now 6.1%.   Hiatal hernia    with reflux   Hyperlipidemia    Statin intolerant   Hypertension    Ichthyosis congenita    Intermittent claudication of both lower extremities due to atherosclerosis ([place]) 08/22/2021   LEA Dopplers 09/09/2021:   Right: Total occlusion noted in the superficial femoral artery. Atherosclerosis noted throughout extremity, see note above. Three vessel runoff.  Left: Total occlusion noted in the superficial femoral artery and/or popliteal artery. Total occlusion noted in the distal anterior tibial artery. Athereosclerosis noted throughout extremity.    NSVD (normal spontaneous vaginal delivery) 1971 & 1972   Obesity    Plantar fasciitis    Left   Presence of permanent cardiac pacemaker    Pulmonary nodule    Imaged multiple times and benign appearing   Sleep apnea    Wears glasses    Past Surgical History:  Procedure Laterality Date   ABDOMINAL HYSTERECTOMY  1975   TAH-BSO   ABSCESS DRAINAGE  1972   right breast   APPLICATION OF WOUND VAC Right 01/15/2023   Procedure: APPLICATION OF INCISIONAL WOUND VAC;  Surgeon: Broadus John, MD;  Location: [facility] OR;  Service: Vascular;  Laterality: Right;   APPLICATION OF WOUND VAC Right 01/26/2023   Procedure: APPLICATION OF WOUND VAC RIGHT GROIN WASHOUT AND SARTORIUS MUSCLE FLAP;  Surgeon: Broadus John, MD;  Location: [facility];  Service: Vascular;  Laterality: Right;   ARTERY REPAIR Right 01/15/2023   Procedure: RIGHT PROFUNDA ARTERY REPAIR WITH 2000G MYRIAD MORCELLS;  Surgeon: Broadus John, MD;  Location: [facility];  Service: Vascular;  Laterality: Right;   BREAST BIOPSY Left 01/14/2009   Stereo- Benign   CARDIAC MRI  01/2018   Difficult to interpret 2/2 PVCs. Normal LV size - EF ~27% with diffuse HK. Mild RV dilation - normal fxn. -- NO MYOCARDIAL LGI => no definitive evidence of prior MI, Infiltrative Dz or Myocarditis -- suspect NICM, possibly related to PVCs.   CARPAL TUNNEL RELEASE Right 07/02/2014   Procedure: RIGHT CARPAL TUNNEL RELEASE;  Surgeon: Wynonia Sours, MD;  Location: [facility];  Service: Orthopedics;  Laterality: Right;   CARPAL TUNNEL RELEASE Left 06/11/2015   Procedure: LEFT CARPAL TUNNEL RELEASE;  Surgeon: Daryll Brod, MD;   Location: [facility];  Service: Orthopedics;  Laterality: Left;  REGIONAL/FAB   COLONOSCOPY     corn removal  1968   right   CORONARY STENT INTERVENTION N/A 01/10/2023   Procedure: CORONARY STENT INTERVENTION;  Surgeon: Troy Sine, MD;  Location: [facility];  Service: Cardiovascular;  Laterality: N/A;   GROIN DEBRIDEMENT Right 02/06/2023   Procedure: RIGHT GROIN DEBRIDEMENT WITH WOUND VAC CHANGE;  Surgeon: Broadus John, MD;  Location: [facility];  Service: Vascular;  Laterality: Right;   HEMATOMA EVACUATION Right 01/15/2023   Procedure: EVACUATION HEMATOMA RIGHT GROIN;  Surgeon: Broadus John, MD;  Location: [facility] OR;  Service: Vascular;  Laterality: Right;   Holter Monitor  06/2017   ~17,000 PVC beats - majority were singlets, some couplets. 44 brief 3-7 beat runs of NSVT. Also noted were less frequent PACs with 11 runs (longest 15 beats)   IABP INSERTION Right  01/10/2023   Procedure: IABP Insertion;  Surgeon: Troy Sine, MD;  Location: [place] [facility] [facility];  Service: Cardiovascular;  Laterality: Right;   ICD IMPLANT N/A 01/23/2023   Procedure: ICD IMPLANT;  Surgeon: Evans Lance, MD;  Location: [place];  Service: Cardiovascular;  Laterality: N/A;   INCISION AND DRAINAGE OF WOUND Right 01/31/2023   Procedure: IRRIGATION AND DEBRIDEMENT RIGHT GROIN WOUND AND WOUND VAC CHANGE;  Surgeon: Broadus John, MD;  Location: [facility];  Service: Vascular;  Laterality: Right;   OPEN REDUCTION INTERNAL FIXATION (ORIF) DISTAL RADIAL FRACTURE Left 07/21/2017   Procedure: OPEN REDUCTION INTERNAL FIXATION (ORIF) LEFT DISTAL RADIAL FRACTURE;  Surgeon: Renette Butters, MD;  Location: [facility];  Service: Orthopedics;  Laterality: Left;   RIGHT/LEFT HEART CATH AND CORONARY ANGIOGRAPHY N/A 06/30/2017   Procedure: Right/Left Heart Cath and Coronary Angiography;  Surgeon: Leonie Man, MD;  Location: [facility] INVASIVE CV LAB:  pRCA 55%, pCx 40%, OM1 45%, mCx 50%, D2 50% - LVEF 25-35%.  Moderately elevated LVEDP(26 mmHg with PCWP 16 mmHg).  FICK CO/CI: 4.47/2.48. PA pressures 47/14 mmHg with a mean of 27 mm.   RIGHT/LEFT HEART CATH AND CORONARY ANGIOGRAPHY N/A 01/10/2023   Procedure: RIGHT/LEFT HEART CATH AND CORONARY ANGIOGRAPHY;  Surgeon: Troy Sine, MD;  Location: [place] [facility];  Service: Cardiovascular;  Laterality: N/A;   ROTATOR CUFF REPAIR     left shoulder   TEMPORARY PACEMAKER N/A 01/19/2023   Procedure: TEMPORARY PACEMAKER;  Surgeon: Jolaine Artist, MD;  Location: [facility];  Service: Cardiovascular;  Laterality: N/A;   TRANSTHORACIC ECHOCARDIOGRAM  06/2017   EF 25 and 30%. GR 1 DD. Mild diastolic dysfunction with elevated LVEDP. Mild valvular disease.   TRANSTHORACIC ECHOCARDIOGRAM  11'18, 6/'19    a) EF remains 30-35%.  Diffuse hypokinesis noted.  Severe LA dilation; b) Improved EF 35-40%.  Diffuse HK.  GR 1 DD.  Moderate TR.  Mild RV dilation.   TRANSTHORACIC ECHOCARDIOGRAM  01/28/2022   Follow-up echo: EF 50 to 55%.  No RWMA.  GRII DD.  Severe LA dilation.  Mild RA dilation with normal PAP and RAP.  Mild to moderate TR (no significant change noted)   Zio patch  08/2022   3-day: Currently NSR (HR range 47-91 with average 64 bpm) 1 F AVB with IVCD/BBB.  Rare isolated PACs (<1%).  Frequent PVCs (6.1% with rare couplets and triplets.  Also bigeminy.  2 atrial runs 8 beats 5 2 seconds.  No sustained arrhythmias.   ZIO Patch 14 d Event Monitor  10/2018   Results as below < 1% PVC.  (Notably improved after starting amiodarone)   Family History  Problem Relation Age of Onset   Heart disease Mother        s/p pacemaker   Diabetes Mother    Heart disease Father    Diabetes Father    Heart disease Brother        MI   Cancer Brother        Lung   Hypertension Other    Colon cancer Neg Hx    Breast cancer Neg Hx    Social History:  reports that she quit smoking about 29 years ago. Her smoking use included cigarettes. She has never used  smokeless tobacco. She reports that she does not drink alcohol and does not use drugs. Allergies:  Allergies  Allergen Reactions   Ezetimibe-Simvastatin Other (See Comments)    REACTION: Muscle aches (  side effect)   Lipitor [Atorvastatin] Other (See Comments)    Leg weakness    Claritin [Loratadine]     Possible cause of nightmares.     Spironolactone     Elevated potassium/creatinine   Tamiflu [Oseltamivir Phosphate] Other (See Comments)    nightmares   Pravastatin Sodium Other (See Comments)    REACTION: Muscle aches (side effect)   Sulfonamide Derivatives Nausea And Vomiting   Medications Prior to Admission  Medication Sig Dispense Refill   acetaminophen (TYLENOL) 500 MG tablet Take 500 mg by mouth every 6 (six) hours as needed for mild pain.     amiodarone (PACERONE) 200 MG tablet TAKE ONE-HALF TABLET BY MOUTH EVERY TUES/THURS/SAT (Patient taking differently: Take 100 mg by mouth See admin instructions. TAKE ONE-HALF TABLET BY MOUTH EVERY TUES/THURS/SAT) 90 tablet 3   aspirin 81 MG tablet Take 1 tablet (81 mg total) by mouth daily.     carvedilol (COREG) 25 MG tablet TAKE 1 TABLET BY MOUTH TWICE DAILY (Patient taking differently: Take 25 mg by mouth 2 (two) times daily with a meal.) 180 tablet 1   Cholecalciferol (VITAMIN D) 1000 UNITS capsule Take 1,000 Units by mouth daily.     doxycycline (VIBRA-TABS) 100 MG tablet Take 1 tablet (100 mg total) by mouth 2 (two) times daily. 20 tablet 0   Fluticasone-Umeclidin-Vilant (TRELEGY ELLIPTA) 100-62.5-25 MCG/ACT AEPB Inhale 1 puff into the lungs daily. (Patient not taking: Reported on 12/02/2022) 2 each 0   furosemide (LASIX) 20 MG tablet Take 1 tablet (20 mg total) by mouth daily. 90 tablet 3   Ipratropium-Albuterol (COMBIVENT RESPIMAT) 20-100 MCG/ACT AERS respimat Inhale 1 puff into the lungs every 6 (six) hours as needed for wheezing or shortness of breath. 4 g 0   JARDIANCE 10 MG TABS tablet TAKE 1/2 TABLET(5 MG) BY MOUTH DAILY (Patient  taking differently: Take 12.5 mg by mouth daily.) 15 tablet 6   metFORMIN (GLUCOPHAGE) 500 MG tablet TAKE 1/2 TABLET BY MOUTH DAILY WITH A MEAL (Patient taking differently: Take 250 mg by mouth daily.) 90 tablet 0   rosuvastatin (CRESTOR) 10 MG tablet TAKE 1 TABLET(10 MG) BY MOUTH DAILY (Patient taking differently: Take 10 mg by mouth daily.) 90 tablet 3   sacubitril-valsartan (ENTRESTO) 97-103 MG Take 1 tablet by mouth 2 (two) times daily. 180 tablet 3   traMADol (ULTRAM) 50 MG tablet TAKE 1 TABLET(50 MG) BY MOUTH THREE TIMES DAILY AS NEEDED (Patient taking differently: Take 50 mg by mouth 3 (three) times daily between meals as needed for moderate pain.) 270 tablet 1      Home: Home Living Family/patient expects to be discharged to:: Private residence Living Arrangements: Spouse/significant other Available Help at Discharge: Family, Available 24 hours/day Type of Home: House Home Access: Stairs to enter Technical brewer of Steps: 3 Entrance Stairs-Rails:  (+ rail) Home Layout: Two level, Bed/bath upstairs Alternate Level Stairs-Number of Steps: 14 Bathroom Shower/Tub: Chiropodist: Standard Bathroom Accessibility: Yes Home Equipment: Radio producer - single point  Lives With: Spouse   Functional History: Prior Function Prior Level of Function : Independent/Modified Independent, Driving Mobility Comments: no AD in home, [facility], uses cane outside home ADLs Comments: independent ADls, light IADLs, drives and manages meds  Functional Status:  Mobility: Bed Mobility Overal bed mobility: Needs Assistance Bed Mobility: Rolling, Sidelying to Sit, Sit to Sidelying Rolling: Min guard Sidelying to sit: Min guard, HOB elevated Supine to sit: Min guard Sit to supine: Mod assist Sit  to sidelying: Min guard, HOB elevated General bed mobility comments: pt received seated EOB with OT present Transfers Overall transfer level: Needs assistance Equipment used:  Rolling walker (2 wheels) Transfers: Sit to/from Stand Sit to Stand: Supervision Bed to/from chair/wheelchair/BSC transfer type:: Step pivot Step pivot transfers: Min assist, +2 safety/equipment General transfer comment: cues for safety and UE placement Ambulation/Gait Ambulation/Gait assistance: Min guard Gait Distance (Feet): 50 Feet Assistive device: Rolling walker (2 wheels) Gait Pattern/deviations: Step-through pattern, Trunk flexed, Drifts right/left, Decreased stride length General Gait Details: min guard for safety and line assist, pt with poor awareness of lines and needs dense safety cues; also postural cues needed Gait velocity: decreased Gait velocity interpretation: <1.31 ft/sec, indicative of household ambulator Pre-gait activities: Side stepped up side of bed with walker with +2 min assist Stairs: Yes Stairs assistance: Min guard Stair Management: Step to pattern, Forwards, With walker Number of Stairs: 10 General stair comments: single 7" platform step in room x10 reps in a row with BUE support to simulate B railings    ADL: ADL Overall ADL's : Needs assistance/impaired Eating/Feeding: Modified independent Grooming: Wash/dry hands, Standing, Supervision/safety Grooming Details (indicate cue type and reason): Standing at sink, Supervision with BUEs unsupported Upper Body Bathing: Minimal assistance, Sitting Lower Body Bathing: Moderate assistance, Sit to/from stand Upper Body Dressing : Moderate assistance Lower Body Dressing: Supervision/safety, Sitting/lateral leans Lower Body Dressing Details (indicate cue type and reason): Max A to don R sock seated EOB, Pt donned L sock using figure 4 technique with Supervision Toilet Transfer: Rolling walker (2 wheels), Cueing for safety Toilet Transfer Details (indicate cue type and reason): Pt complete toilet t/f with SBA + RW. Needed cueing to slow down pace to allow for line/lead management. Toileting- Water quality scientist  and Hygiene: Supervision/safety, Sit to/from stand Functional mobility during ADLs: Min guard, Rolling walker (2 wheels)  Cognition: Cognition Overall Cognitive Status: Within Functional Limits for tasks assessed Orientation Level: Oriented X4 Cognition Arousal/Alertness: Awake/alert Behavior During Therapy: WFL for tasks assessed/performed Overall Cognitive Status: Within Functional Limits for tasks assessed Area of Impairment: Safety/judgement Current Attention Level: Sustained Memory: Decreased short-term memory Following Commands: Follows one step commands consistently Safety/Judgement: Decreased awareness of safety Awareness: Emergent Problem Solving: Slow processing General Comments: HoH, needs reminders to wait for therapist to arrange lines prior to proceeding.  Physical Exam: Blood pressure (!) 109/58, pulse 72, temperature 98.6 F (37 C), temperature source Oral, resp. rate 17, height '5\' 2"'$  (1.575 m), weight 67 kg, SpO2 98 %. Physical Exam Constitutional:      Appearance: Normal appearance.  HENT:     Head: Normocephalic and atraumatic.     Ears:     Comments: A little HOH    Nose: Nose normal.     Mouth/Throat:     Mouth: Mucous membranes are moist.  Eyes:     Extraocular Movements: Extraocular movements intact.     Pupils: Pupils are equal, round, and reactive to light.  Cardiovascular:     Rate and Rhythm: Normal rate. Rhythm irregular.     Heart sounds: Murmur heard.  Pulmonary:     Effort: Pulmonary effort is normal. No respiratory distress.     Breath sounds: Normal breath sounds. No stridor. No wheezing.  Abdominal:     General: Bowel sounds are normal. There is no distension.     Palpations: Abdomen is soft.     Tenderness: There is no abdominal tenderness.  Musculoskeletal:        General: Normal  range of motion.     Cervical back: Normal range of motion and neck supple.     Right lower leg: No edema.     Left lower leg: No edema.  Skin:     Comments: Skin moist/tacky along abdominal/inguinal folds. No frank erythema appreciated. Vac in left groin sealed.    Neurological:     Mental Status: She is alert.     Comments: Alert and oriented x 3. Normal insight and awareness. Intact Memory. Normal language and speech. Cranial nerve exam unremarkable. HOH, UE 5/5. LE 3+ prox to 4/5 distally. Sensory exam normal for light touch and pain in all 4 limbs. No limb ataxia or cerebellar signs. No abnormal tone appreciated.    Psychiatric:        Mood and Affect: Mood normal.        Behavior: Behavior normal.     Results for orders placed or performed during the hospital encounter of 01/10/23 (from the past 48 hour(s))  Glucose, capillary     Status: Abnormal   Collection Time: 02/16/23  9:18 PM  Result Value Ref Range   Glucose-Capillary 149 (H) 70 - 99 mg/dL    Comment: Glucose reference range applies only to samples taken after fasting for at least 8 hours.   Comment 1 Notify RN    Comment 2 Document in Chart   Magnesium     Status: None   Collection Time: 02/17/23  5:04 AM  Result Value Ref Range   Magnesium 2.1 1.7 - 2.4 mg/dL    Comment: Performed at [facility], [place] [address], [place], [place] [id number]  Basic metabolic panel     Status: Abnormal   Collection Time: 02/17/23  5:04 AM  Result Value Ref Range   Sodium 142 135 - 145 mmol/L   Potassium 4.0 3.5 - 5.1 mmol/L   Chloride 103 98 - 111 mmol/L   CO2 30 22 - 32 mmol/L   Glucose, Bld 108 (H) 70 - 99 mg/dL    Comment: Glucose reference range applies only to samples taken after fasting for at least 8 hours.   BUN 47 (H) 8 - 23 mg/dL   Creatinine, Ser 1.41 (H) 0.44 - 1.00 mg/dL   Calcium 9.8 8.9 - 10.3 mg/dL   GFR, Estimated 37 (L) >60 mL/min    Comment: (NOTE) Calculated using the CKD-EPI Creatinine Equation (2021)    Anion gap 9 5 - 15    Comment: Performed at [facility] [address] [place], [place] [address]  CBC     Status: Abnormal    Collection Time: 02/17/23  5:04 AM  Result Value Ref Range   WBC 5.7 4.0 - 10.5 K/uL   RBC 3.35 (L) 3.87 - 5.11 MIL/uL   Hemoglobin 10.5 (L) 12.0 - 15.0 g/dL   HCT 34.2 (L) 36.0 - 46.0 %   MCV 102.1 (H) 80.0 - 100.0 fL   MCH 31.3 26.0 - 34.0 pg   MCHC 30.7 30.0 - 36.0 g/dL   RDW 19.1 (H) 11.5 - 15.5 %   Platelets 290 150 - 400 K/uL   nRBC 0.0 0.0 - 0.2 %    Comment: Performed at [facility], [place] [address] [place], [place] [address]  Glucose, capillary     Status: Abnormal   Collection Time: 02/17/23  6:19 AM  Result Value Ref Range   Glucose-Capillary 141 (H) 70 - 99 mg/dL    Comment: Glucose reference range  applies only to samples taken after fasting for at least 8 hours.   Comment 1 Notify RN    Comment 2 Document in Chart   Glucose, capillary     Status: Abnormal   Collection Time: 02/17/23 11:25 AM  Result Value Ref Range   Glucose-Capillary 123 (H) 70 - 99 mg/dL    Comment: Glucose reference range applies only to samples taken after fasting for at least 8 hours.  Glucose, capillary     Status: Abnormal   Collection Time: 02/17/23  4:17 PM  Result Value Ref Range   Glucose-Capillary 112 (H) 70 - 99 mg/dL    Comment: Glucose reference range applies only to samples taken after fasting for at least 8 hours.  Glucose, capillary     Status: Abnormal   Collection Time: 02/17/23  9:45 PM  Result Value Ref Range   Glucose-Capillary 131 (H) 70 - 99 mg/dL    Comment: Glucose reference range applies only to samples taken after fasting for at least 8 hours.   Comment 1 Notify RN    Comment 2 Document in Chart   Glucose, capillary     Status: Abnormal   Collection Time: 02/18/23  6:07 AM  Result Value Ref Range   Glucose-Capillary 104 (H) 70 - 99 mg/dL    Comment: Glucose reference range applies only to samples taken after fasting for at least 8 hours.   Comment 1 Notify RN    Comment 2 Document in Chart   Glucose, capillary     Status: Abnormal   Collection Time:  02/18/23  8:04 AM  Result Value Ref Range   Glucose-Capillary 104 (H) 70 - 99 mg/dL    Comment: Glucose reference range applies only to samples taken after fasting for at least 8 hours.  Glucose, capillary     Status: Abnormal   Collection Time: 02/18/23 12:41 PM  Result Value Ref Range   Glucose-Capillary 190 (H) 70 - 99 mg/dL    Comment: Glucose reference range applies only to samples taken after fasting for at least 8 hours.  Glucose, capillary     Status: Abnormal   Collection Time: 02/18/23  3:53 PM  Result Value Ref Range   Glucose-Capillary 103 (H) 70 - 99 mg/dL    Comment: Glucose reference range applies only to samples taken after fasting for at least 8 hours.   No results found.    Blood pressure (!) 109/58, pulse 72, temperature 98.6 F (37 C), temperature source Oral, resp. rate 17, height '5\' 2"'$  (1.575 m), weight 67 kg, SpO2 98 %.  Medical Problem List and Plan: 1. Functional deficits secondary to debility after multiple medical issues after acute lateral wall MI and cardiogenic shock.   -course complicated by right femoral pseudoaneurysm which ultimately required I&D as well as sartorius muscle flap and vac placement  -patient may not yet shower  -ELOS/Goals: 7-10 days, mod I to supervision goals 2.  Antithrombotics: -DVT/anticoagulation:  Pharmaceutical: Eliquis  -antiplatelet therapy: plavix 3. Pain Management: tylenol prn  -lidocaine patches prn 4. Mood/Behavior/Sleep: monitor sleep patterns  -melatonin PRN for sleep  -antipsychotic agents: n/a 5. Neuropsych/cognition: This patient is capable of making decisions on her own behalf. 6. Skin/Wound Care: vac changes right groin Mondays and Thursday  -Add InterDry dressing to abdominal/inguinal fold 7. Fluids/Electrolytes/Nutrition: encourage appropriate PO inatke  -check labs Monday morning    Meredith Staggers, MD 02/18/2023

## 2023-02-18 NOTE — Progress Notes (Signed)
CARDIAC REHAB PHASE I   PRE:  Rate/Rhythm: 87 SR    BP: sitting 106/59    SpO2: 96 RA  MODE:  Ambulation: 410 ft   POST:  Rate/Rhythm: 92 pacing    BP: sitting 124/62     SpO2: 97 RA  Ambulated with RW, gait belt contact guard. Fatigue with distance and needed rest against wall. DOE on return trip. Return to recliner. Overall tolerated well, VSS.  [id number]  Yves Dill BS, ACSM-CEP 02/18/2023 1:52 PM

## 2023-02-18 NOTE — Progress Notes (Addendum)
Progress Note   Patient: Natasha Woodard [id number] DOB: 07-29-1941 DOA: 01/10/2023     38 DOS: the patient was seen and examined on 02/18/2023   Brief hospital course: 82 year old woman presented with chest pain.  Found to have acute lateral wall MI secondary to distal left circumflex occlusion by cardiac catheterization, complicated by severe ischemic MR and acute systolic heart failure with low output.  Required placement of IABP which was later removed secondary to concern for limb ischemia.  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  Subsequently suffered a ventricular tachycardia arrest, ICD was placed, several trips to the OR for groin washout, debridement and wound VAC placement. Now ready for CIR.  1/31 Underwent thrombin injection to right femoral PSA . Hgb drifting down 10.3>8.3>6.8  2/1 Started on amio drip to suppress PVCs. Got dose of IV lasix x1. CVP low.  2/4 Ruptured PSA. Returned to OR for repair. VAC in place 2/5 Midodrine 5 mg three times daily added.  Back in A fib.  2/7 TEE with mod MR.  2/8 VT arrest--> shock x1 ROSC after 2 minutes. Given lidocaine and amio was increased to 60 mg.  2/9 Transvenous pacemaker placed for overdrive pacing with PMVT.  2/11 Milrinone restarted, Co-ox 49% 2/12 PICC removed. S/p [employer] ICD  2/14 Milrinone stopped . Amio drip stopped.  2/15 Returned to OR for washout and VAC change with sartorious muscle flap.  2/20 OR for washout and wound vac placement.   2/26 OR groin washout, debridement and vac change  3/5 ready for CIR 03/06 peer to peer call with insurance medical director, and CIR has been declined. Apparently patient's functional and cognitive function is too good to qualify for CIR level of services.  03/09 patient medically stable for CIR, added antifungal for vaginal yeast infection.   Assessment and Plan: No notes have been filed under this hospital  service. Service: Hospitalist   Acute lateral wall myocardial infarction.  Cardiogenic shock Acute on chronic systolic CHF Severe ischemic MR Status post ICD placement 2/12 Clinically euvolemic.  Continue with losartan, torsemide and spironolactone.    Paroxysmal atrial fibrillation VT arrest 2/7 S/p DCCV x 1 with CPR, ROSC after 2 minutes.  Polymorphic VT, appeared bradycardia-mediated with intermittent junctional rhythm. S/p DDD ICD on 2/12 Continue amiodarone will check if dose needs to be reduced.  Patient has been hemodynamically stable, Ok to change level of care to telemetry.    Right femoral pseudoaneurysm Right groin wound, secondary infection Patient developed pseudoaneurysm following balloon pump, s/p thrombin injection by Dr. Carlis Abbott 1/31.  2/4 stat u/s showed recurrent PSA with active bleeding into PSA. S/p PSA repair with VAC placement.  2/15 VAC change with sartorious muscle flap.  2/20, went to the OR for washout and wound VAC placement. 2/27, underwent wound VAC change in OR.  Tentative plan of wound VAC change at bedside on 3/1. Wound culture showed polymicrobial growth showing moderate Morganella and Enterococcus with few  pseudomonas.   Completed IV Zosyn per vascular surgery Okay for discharge to CIR from vascular standpoint.  Wound VAC changes Mondays and Thursdays     AKI on CKD stage 3b Baseline creatinine 1.3-1.5.  Stable renal function with serum cr at 1,40 with K at 3,8 and serum bicarbonate at 29,  Na 142.  Plan to continue close follow up of renal function and electrolytes.    Chronic anemia Severe iron deficiency Secondary to blood loss and iron deficiency  given IV iron 2/18 Hemoglobin stable   Cerumen impaction Usually addressed by outpatient specialist with scope and tweezers.  Inpatient attempts failed.  Follow-up as an outpatient.     Subjective: Patient with no dyspnea or chest pain, positive yeast infection, genital   Physical  Exam: Vitals:   02/18/23 0518 02/18/23 0807 02/18/23 1235 02/18/23 1238  BP: 112/67 119/68  (!) 109/58  Pulse: 72 70  72  Resp: '18 17 19 18  '$ Temp: 98.1 F (36.7 C) 97.9 F (36.6 C) 98.6 F (37 C) 98.6 F (37 C)  TempSrc: Oral Oral Oral Oral  SpO2: 98% 98%  98%  Weight:      Height:       Neurology awake and alert ENT with mild pallor Cardiovascular with S1 and S2 present and rhythmic  Respiratory with no rales or wheezing Abdomen with no distention  No lower extremity edema  Data Reviewed:    Family Communication: no family at the bedside   Disposition: Status is: Inpatient Remains inpatient appropriate because: CIR   Planned Discharge Destination: Rehab     Author: Tawni Millers, MD 02/18/2023 2:52 PM  For on call review [email or web address].

## 2023-02-19 ENCOUNTER — Other Ambulatory Visit: Payer: Self-pay

## 2023-02-19 ENCOUNTER — Encounter (HOSPITAL_COMMUNITY): Payer: Self-pay | Admitting: Physical Medicine and Rehabilitation

## 2023-02-19 ENCOUNTER — Inpatient Hospital Stay (HOSPITAL_COMMUNITY)
Admit: 2023-02-19 | Discharge: 2023-02-28 | DRG: 945 | Disposition: A | Discharge status: Home Health | Payer: Medicare Other | Source: Intra-hospital | Attending: Physical Medicine and Rehabilitation | Admitting: Physical Medicine and Rehabilitation

## 2023-02-19 DIAGNOSIS — R570 Cardiogenic shock: Secondary | ICD-10-CM | POA: Diagnosis not present

## 2023-02-19 DIAGNOSIS — Z90710 Acquired absence of both cervix and uterus: Secondary | ICD-10-CM

## 2023-02-19 DIAGNOSIS — I5042 Chronic combined systolic (congestive) and diastolic (congestive) heart failure: Secondary | ICD-10-CM | POA: Diagnosis not present

## 2023-02-19 DIAGNOSIS — G4700 Insomnia, unspecified: Secondary | ICD-10-CM | POA: Diagnosis not present

## 2023-02-19 DIAGNOSIS — Z882 Allergy status to sulfonamides status: Secondary | ICD-10-CM

## 2023-02-19 DIAGNOSIS — I249 Acute ischemic heart disease, unspecified: Secondary | ICD-10-CM | POA: Diagnosis not present

## 2023-02-19 DIAGNOSIS — N1832 Chronic kidney disease, stage 3b: Secondary | ICD-10-CM | POA: Diagnosis not present

## 2023-02-19 DIAGNOSIS — Z888 Allergy status to other drugs, medicaments and biological substances status: Secondary | ICD-10-CM

## 2023-02-19 DIAGNOSIS — I724 Aneurysm of artery of lower extremity: Secondary | ICD-10-CM | POA: Diagnosis present

## 2023-02-19 DIAGNOSIS — Z7951 Long term (current) use of inhaled steroids: Secondary | ICD-10-CM

## 2023-02-19 DIAGNOSIS — I340 Nonrheumatic mitral (valve) insufficiency: Secondary | ICD-10-CM | POA: Diagnosis not present

## 2023-02-19 DIAGNOSIS — I429 Cardiomyopathy, unspecified: Secondary | ICD-10-CM | POA: Diagnosis not present

## 2023-02-19 DIAGNOSIS — N764 Abscess of vulva: Secondary | ICD-10-CM | POA: Diagnosis not present

## 2023-02-19 DIAGNOSIS — L304 Erythema intertrigo: Secondary | ICD-10-CM | POA: Diagnosis present

## 2023-02-19 DIAGNOSIS — I5023 Acute on chronic systolic (congestive) heart failure: Secondary | ICD-10-CM

## 2023-02-19 DIAGNOSIS — I130 Hypertensive heart and chronic kidney disease with heart failure and stage 1 through stage 4 chronic kidney disease, or unspecified chronic kidney disease: Secondary | ICD-10-CM | POA: Diagnosis present

## 2023-02-19 DIAGNOSIS — I2129 ST elevation (STEMI) myocardial infarction involving other sites: Secondary | ICD-10-CM | POA: Diagnosis present

## 2023-02-19 DIAGNOSIS — D62 Acute posthemorrhagic anemia: Secondary | ICD-10-CM | POA: Diagnosis not present

## 2023-02-19 DIAGNOSIS — Z90722 Acquired absence of ovaries, bilateral: Secondary | ICD-10-CM

## 2023-02-19 DIAGNOSIS — H6120 Impacted cerumen, unspecified ear: Secondary | ICD-10-CM | POA: Diagnosis present

## 2023-02-19 DIAGNOSIS — Z8674 Personal history of sudden cardiac arrest: Secondary | ICD-10-CM | POA: Diagnosis not present

## 2023-02-19 DIAGNOSIS — I214 Non-ST elevation (NSTEMI) myocardial infarction: Secondary | ICD-10-CM | POA: Diagnosis not present

## 2023-02-19 DIAGNOSIS — R5381 Other malaise: Principal | ICD-10-CM | POA: Diagnosis present

## 2023-02-19 DIAGNOSIS — T81718A Complication of other artery following a procedure, not elsewhere classified, initial encounter: Secondary | ICD-10-CM

## 2023-02-19 DIAGNOSIS — K219 Gastro-esophageal reflux disease without esophagitis: Secondary | ICD-10-CM | POA: Diagnosis not present

## 2023-02-19 DIAGNOSIS — K449 Diaphragmatic hernia without obstruction or gangrene: Secondary | ICD-10-CM | POA: Diagnosis present

## 2023-02-19 DIAGNOSIS — E785 Hyperlipidemia, unspecified: Secondary | ICD-10-CM | POA: Diagnosis present

## 2023-02-19 DIAGNOSIS — Z7982 Long term (current) use of aspirin: Secondary | ICD-10-CM

## 2023-02-19 DIAGNOSIS — E1122 Type 2 diabetes mellitus with diabetic chronic kidney disease: Secondary | ICD-10-CM | POA: Diagnosis not present

## 2023-02-19 DIAGNOSIS — N179 Acute kidney failure, unspecified: Secondary | ICD-10-CM | POA: Diagnosis not present

## 2023-02-19 DIAGNOSIS — Z79899 Other long term (current) drug therapy: Secondary | ICD-10-CM

## 2023-02-19 DIAGNOSIS — S31109S Unspecified open wound of abdominal wall, unspecified quadrant without penetration into peritoneal cavity, sequela: Secondary | ICD-10-CM

## 2023-02-19 DIAGNOSIS — I480 Paroxysmal atrial fibrillation: Secondary | ICD-10-CM | POA: Diagnosis not present

## 2023-02-19 DIAGNOSIS — T8189XA Other complications of procedures, not elsewhere classified, initial encounter: Secondary | ICD-10-CM | POA: Diagnosis not present

## 2023-02-19 DIAGNOSIS — D649 Anemia, unspecified: Secondary | ICD-10-CM | POA: Diagnosis not present

## 2023-02-19 DIAGNOSIS — Z87891 Personal history of nicotine dependence: Secondary | ICD-10-CM

## 2023-02-19 DIAGNOSIS — I2510 Atherosclerotic heart disease of native coronary artery without angina pectoris: Secondary | ICD-10-CM | POA: Diagnosis present

## 2023-02-19 DIAGNOSIS — E1151 Type 2 diabetes mellitus with diabetic peripheral angiopathy without gangrene: Secondary | ICD-10-CM | POA: Diagnosis not present

## 2023-02-19 DIAGNOSIS — Z833 Family history of diabetes mellitus: Secondary | ICD-10-CM

## 2023-02-19 DIAGNOSIS — Z9079 Acquired absence of other genital organ(s): Secondary | ICD-10-CM

## 2023-02-19 DIAGNOSIS — B3731 Acute candidiasis of vulva and vagina: Secondary | ICD-10-CM | POA: Diagnosis present

## 2023-02-19 DIAGNOSIS — Z95810 Presence of automatic (implantable) cardiac defibrillator: Secondary | ICD-10-CM

## 2023-02-19 DIAGNOSIS — G4730 Sleep apnea, unspecified: Secondary | ICD-10-CM | POA: Diagnosis present

## 2023-02-19 DIAGNOSIS — Z8249 Family history of ischemic heart disease and other diseases of the circulatory system: Secondary | ICD-10-CM

## 2023-02-19 DIAGNOSIS — Z7984 Long term (current) use of oral hypoglycemic drugs: Secondary | ICD-10-CM

## 2023-02-19 DIAGNOSIS — N183 Chronic kidney disease, stage 3 unspecified: Secondary | ICD-10-CM | POA: Diagnosis not present

## 2023-02-19 LAB — GLUCOSE, CAPILLARY
Glucose-Capillary: 113 mg/dL — ABNORMAL HIGH (ref 70–99)
Glucose-Capillary: 159 mg/dL — ABNORMAL HIGH (ref 70–99)
Glucose-Capillary: 133 mg/dL — ABNORMAL HIGH (ref 70–99)
Glucose-Capillary: 164 mg/dL — ABNORMAL HIGH (ref 70–99)

## 2023-02-19 MED ORDER — LOSARTAN POTASSIUM 25 MG PO TABS
12.5000 mg | ORAL_TABLET | Freq: Every day | ORAL | Status: DC
  Administered 2023-02-20 – 2023-02-28 (×9): 12.5 mg via ORAL
  Filled 2023-02-19 (×10): qty 0.5

## 2023-02-19 MED ORDER — APIXABAN 2.5 MG PO TABS
2.5000 mg | ORAL_TABLET | Freq: Two times a day (BID) | ORAL | Status: DC
  Administered 2023-02-19 – 2023-02-28 (×18): 2.5 mg via ORAL
  Filled 2023-02-19 (×18): qty 1

## 2023-02-19 MED ORDER — ALUM & MAG HYDROXIDE-SIMETH 200-200-20 MG/5ML PO SUSP
30.0000 mL | Freq: Four times a day (QID) | ORAL | Status: DC | PRN
  Administered 2023-02-22: 30 mL via ORAL
  Filled 2023-02-19: qty 30

## 2023-02-19 MED ORDER — CLOPIDOGREL BISULFATE 75 MG PO TABS: 75.0000 mg | ORAL_TABLET | Freq: Every day | ORAL | 0 refills | Status: DC

## 2023-02-19 MED ORDER — JUVEN PO PACK
1.0000 | PACK | Freq: Two times a day (BID) | ORAL | Status: DC
  Administered 2023-02-20 – 2023-02-27 (×11): 1 via ORAL
  Filled 2023-02-19 (×12): qty 1

## 2023-02-19 MED ORDER — SIMETHICONE 80 MG PO CHEW: 80.0000 mg | CHEWABLE_TABLET | Freq: Four times a day (QID) | ORAL | Status: DC | PRN

## 2023-02-19 MED ORDER — MELATONIN 5 MG PO TABS
5.0000 mg | ORAL_TABLET | Freq: Every evening | ORAL | Status: DC | PRN
  Administered 2023-02-19 – 2023-02-27 (×7): 5 mg via ORAL
  Filled 2023-02-19 (×7): qty 1

## 2023-02-19 MED ORDER — NITROGLYCERIN 0.4 MG SL SUBL: 0.4000 mg | SUBLINGUAL_TABLET | SUBLINGUAL | Status: DC | PRN

## 2023-02-19 MED ORDER — TORSEMIDE 20 MG PO TABS: 20.0000 mg | ORAL_TABLET | Freq: Every day | ORAL | 0 refills | Status: DC

## 2023-02-19 MED ORDER — ONDANSETRON HCL 4 MG PO TABS: 4.0000 mg | ORAL_TABLET | Freq: Four times a day (QID) | ORAL | Status: DC | PRN

## 2023-02-19 MED ORDER — CLOTRIMAZOLE 1 % VA CREA: 1.0000 | TOPICAL_CREAM | Freq: Every day | VAGINAL | 0 refills | Status: DC

## 2023-02-19 MED ORDER — TRAMADOL HCL 50 MG PO TABS
50.0000 mg | ORAL_TABLET | Freq: Two times a day (BID) | ORAL | Status: DC
  Administered 2023-02-19 – 2023-02-22 (×6): 50 mg via ORAL
  Filled 2023-02-19 (×6): qty 1

## 2023-02-19 MED ORDER — CLOPIDOGREL BISULFATE 75 MG PO TABS
75.0000 mg | ORAL_TABLET | Freq: Every day | ORAL | Status: DC
  Administered 2023-02-20 – 2023-02-28 (×9): 75 mg via ORAL
  Filled 2023-02-19 (×9): qty 1

## 2023-02-19 MED ORDER — APIXABAN 2.5 MG PO TABS: 2.5000 mg | ORAL_TABLET | Freq: Two times a day (BID) | ORAL | 0 refills | Status: DC

## 2023-02-19 MED ORDER — AMIODARONE HCL 200 MG PO TABS
200.0000 mg | ORAL_TABLET | Freq: Every day | ORAL | Status: DC
  Administered 2023-02-20 – 2023-02-28 (×9): 200 mg via ORAL
  Filled 2023-02-19 (×9): qty 1

## 2023-02-19 MED ORDER — ENSURE ENLIVE PO LIQD
237.0000 mL | Freq: Three times a day (TID) | ORAL | Status: DC
  Administered 2023-02-19 – 2023-02-25 (×6): 237 mL via ORAL

## 2023-02-19 MED ORDER — INSULIN ASPART 100 UNIT/ML IJ SOLN
0.0000 [IU] | Freq: Three times a day (TID) | INTRAMUSCULAR | Status: DC
  Administered 2023-02-19 – 2023-02-25 (×10): 2 [IU] via SUBCUTANEOUS
  Administered 2023-02-26: 3 [IU] via SUBCUTANEOUS
  Administered 2023-02-26: 2 [IU] via SUBCUTANEOUS
  Administered 2023-02-27: 3 [IU] via SUBCUTANEOUS
  Administered 2023-02-28: 2 [IU] via SUBCUTANEOUS

## 2023-02-19 MED ORDER — LOSARTAN POTASSIUM 25 MG PO TABS: 12.5000 mg | ORAL_TABLET | Freq: Every day | ORAL | 0 refills | Status: DC

## 2023-02-19 MED ORDER — ROSUVASTATIN CALCIUM 5 MG PO TABS
10.0000 mg | ORAL_TABLET | Freq: Every day | ORAL | Status: DC
  Administered 2023-02-19 – 2023-02-27 (×9): 10 mg via ORAL
  Filled 2023-02-19 (×9): qty 2

## 2023-02-19 MED ORDER — SPIRONOLACTONE 12.5 MG HALF TABLET
12.5000 mg | ORAL_TABLET | Freq: Every day | ORAL | Status: DC
  Administered 2023-02-20 – 2023-02-28 (×9): 12.5 mg via ORAL
  Filled 2023-02-19 (×10): qty 1

## 2023-02-19 MED ORDER — ADULT MULTIVITAMIN W/MINERALS CH
1.0000 | ORAL_TABLET | Freq: Every day | ORAL | Status: DC
  Administered 2023-02-20 – 2023-02-28 (×9): 1 via ORAL
  Filled 2023-02-19 (×9): qty 1

## 2023-02-19 MED ORDER — TORSEMIDE 20 MG PO TABS
20.0000 mg | ORAL_TABLET | Freq: Every day | ORAL | Status: DC
  Administered 2023-02-20 – 2023-02-28 (×9): 20 mg via ORAL
  Filled 2023-02-19 (×9): qty 1

## 2023-02-19 MED ORDER — AMIODARONE HCL 200 MG PO TABS: 200.0000 mg | ORAL_TABLET | Freq: Every day | ORAL | 0 refills | Status: DC

## 2023-02-19 MED ORDER — ACETAMINOPHEN 325 MG PO TABS
650.0000 mg | ORAL_TABLET | Freq: Four times a day (QID) | ORAL | Status: DC
  Administered 2023-02-19 – 2023-02-25 (×22): 650 mg via ORAL
  Filled 2023-02-19 (×24): qty 2

## 2023-02-19 MED ORDER — ENSURE ENLIVE PO LIQD: 237.0000 mL | Freq: Three times a day (TID) | ORAL | 12 refills | Status: DC

## 2023-02-19 MED ORDER — TRAMADOL HCL 50 MG PO TABS: 50.0000 mg | ORAL_TABLET | Freq: Two times a day (BID) | ORAL | Status: DC

## 2023-02-19 MED ORDER — HYDROCORTISONE 1 % EX CREA: TOPICAL_CREAM | CUTANEOUS | Status: DC | PRN

## 2023-02-19 MED ORDER — ONDANSETRON HCL 4 MG/2ML IJ SOLN: 4.0000 mg | Freq: Four times a day (QID) | INTRAMUSCULAR | Status: DC | PRN

## 2023-02-19 MED ORDER — IPRATROPIUM-ALBUTEROL 0.5-2.5 (3) MG/3ML IN SOLN: 3.0000 mL | Freq: Four times a day (QID) | RESPIRATORY_TRACT | Status: DC | PRN

## 2023-02-19 MED ORDER — JUVEN PO PACK: 1.0000 | PACK | Freq: Two times a day (BID) | ORAL | 0 refills | Status: DC

## 2023-02-19 MED ORDER — SPIRONOLACTONE 25 MG PO TABS: 12.5000 mg | ORAL_TABLET | Freq: Every day | ORAL | 0 refills | Status: DC

## 2023-02-19 MED ORDER — LIDOCAINE 5 % EX PTCH
2.0000 | MEDICATED_PATCH | CUTANEOUS | Status: DC
  Administered 2023-02-22 – 2023-02-26 (×6): 2 via TRANSDERMAL
  Filled 2023-02-19 (×8): qty 2

## 2023-02-19 MED ORDER — AMIODARONE HCL 200 MG PO TABS
200.0000 mg | ORAL_TABLET | Freq: Every day | ORAL | Status: DC
  Administered 2023-02-19: 200 mg via ORAL
  Filled 2023-02-19: qty 1

## 2023-02-19 MED ORDER — CLOTRIMAZOLE 1 % VA CREA
1.0000 | TOPICAL_CREAM | Freq: Every day | VAGINAL | Status: AC
  Administered 2023-02-19 – 2023-02-24 (×6): 1 via VAGINAL
  Filled 2023-02-19: qty 45

## 2023-02-19 NOTE — H&P (Signed)
Physical Medicine and Rehabilitation Admission H&P        Chief Complaint  Patient presents with   Chest Pain   Shortness of Breath  : HPI:  Natasha Woodard is a 82 y.o. female who presented on January 10, 2023 with shortness of breath and chest pain. She was admitted with an acute lateral wall MI and was also found to have significant akinesis/hypokinesis with severe mitral regurgitation.  An IABP was placed in the Cath Lab.  Unfortunately, overnight she developed a hematoma to the IABP access site with associated limb ischemia and the IABP was removed.  Patient was seen by vascular surgery for assessment of a right femoral pseudoaneurysm and a thrombin injection was performed.  On 01/15/2023 patient developed swelling in her right groin due to a significant hematoma which rapidly extended down the leg.  Patient underwent emergent right groin exploration and hematoma evacuation with right profunda femoris repair by vascular surgery.  On 01/19/2023 patient went into VT arrest in 01/20/2019 for a transvenous pacemaker placed with an ICD placed on 2/12.  On 01/24/2023 patient had her staples removed and a wound VAC replaced in the right groin area due to incisional breakdown and underlying tissue necrosis.  She went to the OR on 2/15 for washout, debridement, sartorious muscle flap and VAC replacement by Dr. Virl Cagey.  She has been followed essentially weekly by vascular surgery for VAC changes as well as wound washouts in the OR, the most recent of which was on February 06, 2023.  VAC changes have been converted to bedside and now wound care ostomy nurse is managing the VAC changes which are Monday and Thursdays. Wound cultures showed polymicrobial growth with moderate Morganella, Enterococcus and Pseudomonas. Pt has completed a course of IV Zosyn.  Patient has been up with PT and OT addressing self-care and mobility.  Most recently she has been min-guard assist for sit to stand transfers and min guard assist 30  feet x2  for gait needing cues for safety, posture.      Review of Systems  Constitutional:  Negative for chills and fever.  HENT:  Negative for tinnitus.   Eyes: Negative.   Respiratory:  Positive for shortness of breath.   Cardiovascular:  Negative for chest pain.  Gastrointestinal:  Negative for nausea and vomiting.  Genitourinary:  Negative for dysuria.  Musculoskeletal:  Positive for back pain and myalgias.  Skin:  Positive for itching.  Neurological:  Positive for weakness.  Psychiatric/Behavioral:  Negative for depression.         Past Medical History:  Diagnosis Date   AICD (automatic cardioverter/defibrillator) present     Calcific tendonitis 2008    Treatment of left leg   Cardiomyopathy ([place]) 06/2017    a) Echo: EF 25-30%. GR 1-2 DD w/ elevated LVEDP. Mild valvular Dz; b) Cardiac MRI 2/'19: frequent PVCs (diffiuclt to interpret) - EF ~27% w/ diffuse HK.  No evidence of infarct, infiltrative Dz or myocarditis. -- ? if related to PVCs. c) f/u Echo 6/'19: EF 35-40%. Gr 1 DD. Diffuse HK.-> d) Jan 2020 EF 50 to 55%.  Mod MAC, mod LAE.  Ao Sclerosis; e) Echo 1/'21 - EF 60-65%, Gr II DD. Mild Mod MR.    Chronic combined systolic and diastolic CHF, NYHA class 2 and ACA/AHA stage C      Now essentially resolved, back to simply diastolic CHF   Coronary artery disease, non-occlusive      mild-moderate CAD 06/30/17 cath   CTS (  carpal tunnel syndrome)     Diabetes mellitus      Type II   Dysrhythmia      patient said that she cant remember what it is   Frequent unifocal PVCs 01/2018    Event Monitor: NSR, W/ frequent multifocal PVCs (9%-down from 30% prior to amiodarone) and PACs.  Nighttime bradycardia suggestive of OSA.';  Zio patch September 2023: PVC burden now 6.1%.   Hiatal hernia      with reflux   Hyperlipidemia      Statin intolerant   Hypertension     Ichthyosis congenita     Intermittent claudication of both lower extremities due to atherosclerosis ([place]) 08/22/2021     LEA Dopplers 09/09/2021:  Right: Total occlusion noted in the superficial femoral artery. Atherosclerosis noted throughout extremity, see note above. Three vessel runoff.  Left: Total occlusion noted in the superficial femoral artery and/or popliteal artery. Total occlusion noted in the distal anterior tibial artery. Athereosclerosis noted throughout extremity.    NSVD (normal spontaneous vaginal delivery) 1971 & 1972   Obesity     Plantar fasciitis      Left   Presence of permanent cardiac pacemaker     Pulmonary nodule      Imaged multiple times and benign appearing   Sleep apnea     Wears glasses           Past Surgical History:  Procedure Laterality Date   ABDOMINAL HYSTERECTOMY   1975    TAH-BSO   ABSCESS DRAINAGE   1972    right breast   APPLICATION OF WOUND VAC Right 01/15/2023    Procedure: APPLICATION OF INCISIONAL WOUND VAC;  Surgeon: Broadus John, MD;  Location: [facility] OR;  Service: Vascular;  Laterality: Right;   APPLICATION OF WOUND VAC Right 01/26/2023    Procedure: APPLICATION OF WOUND VAC RIGHT GROIN WASHOUT AND SARTORIUS MUSCLE FLAP;  Surgeon: Broadus John, MD;  Location: [place];  Service: Vascular;  Laterality: Right;   ARTERY REPAIR Right 01/15/2023    Procedure: RIGHT PROFUNDA ARTERY REPAIR WITH 2000G MYRIAD MORCELLS;  Surgeon: Broadus John, MD;  Location: [place];  Service: Vascular;  Laterality: Right;   BREAST BIOPSY Left 01/14/2009    Stereo- Benign   CARDIAC MRI   01/2018    Difficult to interpret 2/2 PVCs. Normal LV size - EF ~27% with diffuse HK. Mild RV dilation - normal fxn. -- NO MYOCARDIAL LGI => no definitive evidence of prior MI, Infiltrative Dz or Myocarditis -- suspect NICM, possibly related to PVCs.   CARPAL TUNNEL RELEASE Right 07/02/2014    Procedure: RIGHT CARPAL TUNNEL RELEASE;  Surgeon: Wynonia Sours, MD;  Location: [place];  Service: Orthopedics;  Laterality: Right;   CARPAL TUNNEL RELEASE Left 06/11/2015    Procedure: LEFT  CARPAL TUNNEL RELEASE;  Surgeon: Daryll Brod, MD;  Location: [place];  Service: Orthopedics;  Laterality: Left;  REGIONAL/FAB   COLONOSCOPY       corn removal   1968    right   CORONARY STENT INTERVENTION N/A 01/10/2023    Procedure: CORONARY STENT INTERVENTION;  Surgeon: Troy Sine, MD;  Location: [place] [facility] LAB;  Service: Cardiovascular;  Laterality: N/A;   GROIN DEBRIDEMENT Right 02/06/2023    Procedure: RIGHT GROIN DEBRIDEMENT WITH WOUND VAC CHANGE;  Surgeon: Broadus John, MD;  Location: [place];  Service: Vascular;  Laterality: Right;   HEMATOMA EVACUATION Right 01/15/2023    Procedure:  EVACUATION HEMATOMA RIGHT GROIN;  Surgeon: Broadus John, MD;  Location: [facility] OR;  Service: Vascular;  Laterality: Right;   Holter Monitor   06/2017    ~17,000 PVC beats - majority were singlets, some couplets. 44 brief 3-7 beat runs of NSVT. Also noted were less frequent PACs with 11 runs (longest 15 beats)   IABP INSERTION Right 01/10/2023    Procedure: IABP Insertion;  Surgeon: Troy Sine, MD;  Location: [place] [facility];  Service: Cardiovascular;  Laterality: Right;   ICD IMPLANT N/A 01/23/2023    Procedure: ICD IMPLANT;  Surgeon: [place] Lance, MD;  Location: [place] [facility];  Service: Cardiovascular;  Laterality: N/A;   INCISION AND DRAINAGE OF WOUND Right 01/31/2023    Procedure: IRRIGATION AND DEBRIDEMENT RIGHT GROIN WOUND AND WOUND VAC CHANGE;  Surgeon: Broadus John, MD;  Location: [place];  Service: Vascular;  Laterality: Right;   OPEN REDUCTION INTERNAL FIXATION (ORIF) DISTAL RADIAL FRACTURE Left 07/21/2017    Procedure: OPEN REDUCTION INTERNAL FIXATION (ORIF) LEFT DISTAL RADIAL FRACTURE;  Surgeon: Renette Butters, MD;  Location: [place];  Service: Orthopedics;  Laterality: Left;   RIGHT/LEFT HEART CATH AND CORONARY ANGIOGRAPHY N/A 06/30/2017    Procedure: Right/Left Heart Cath and Coronary Angiography;  Surgeon: Leonie Man, MD;  Location: [facility] INVASIVE CV  LAB:  pRCA 55%, pCx 40%, OM1 45%, mCx 50%, D2 50% - LVEF 25-35%. Moderately elevated LVEDP(26 mmHg with PCWP 16 mmHg).  FICK CO/CI: 4.47/2.48. PA pressures 47/14 mmHg with a mean of 27 mm.   RIGHT/LEFT HEART CATH AND CORONARY ANGIOGRAPHY N/A 01/10/2023    Procedure: RIGHT/LEFT HEART CATH AND CORONARY ANGIOGRAPHY;  Surgeon: Troy Sine, MD;  Location: [place] [facility];  Service: Cardiovascular;  Laterality: N/A;   ROTATOR CUFF REPAIR        left shoulder   TEMPORARY PACEMAKER N/A 01/19/2023    Procedure: TEMPORARY PACEMAKER;  Surgeon: Jolaine Artist, MD;  Location: [place] [facility];  Service: Cardiovascular;  Laterality: N/A;   TRANSTHORACIC ECHOCARDIOGRAM   06/2017    EF 25 and 30%. GR 1 DD. Mild diastolic dysfunction with elevated LVEDP. Mild valvular disease.   TRANSTHORACIC ECHOCARDIOGRAM   11'18, 6/'19     a) EF remains 30-35%.  Diffuse hypokinesis noted.  Severe LA dilation; b) Improved EF 35-40%.  Diffuse HK.  GR 1 DD.  Moderate TR.  Mild RV dilation.   TRANSTHORACIC ECHOCARDIOGRAM   01/28/2022    Follow-up echo: EF 50 to 55%.  No RWMA.  GRII DD.  Severe LA dilation.  Mild RA dilation with normal PAP and RAP.  Mild to moderate TR (no significant change noted)   Zio patch   08/2022    3-day: Currently NSR (HR range 47-91 with average 64 bpm) 1 F AVB with IVCD/BBB.  Rare isolated PACs (<1%).  Frequent PVCs (6.1% with rare couplets and triplets.  Also bigeminy.  2 atrial runs 8 beats 5 2 seconds.  No sustained arrhythmias.   ZIO Patch 14 d Event Monitor   10/2018    Results as below < 1% PVC.  (Notably improved after starting amiodarone)         Family History  Problem Relation Age of Onset   Heart disease Mother          s/p pacemaker   Diabetes Mother     Heart disease Father     Diabetes Father     Heart disease Brother  MI   Cancer Brother          Lung   Hypertension Other     Colon cancer Neg Hx     Breast cancer Neg Hx      Social History:  reports that  she quit smoking about 29 years ago. Her smoking use included cigarettes. She has never used smokeless tobacco. She reports that she does not drink alcohol and does not use drugs. Allergies:       Allergies  Allergen Reactions   Ezetimibe-Simvastatin Other (See Comments)      REACTION: Muscle aches (side effect)   Lipitor [Atorvastatin] Other (See Comments)      Leg weakness    Claritin [Loratadine]        Possible cause of nightmares.     Spironolactone        Elevated potassium/creatinine   Tamiflu [Oseltamivir Phosphate] Other (See Comments)      nightmares   Pravastatin Sodium Other (See Comments)      REACTION: Muscle aches (side effect)   Sulfonamide Derivatives Nausea And Vomiting          Medications Prior to Admission  Medication Sig Dispense Refill   acetaminophen (TYLENOL) 500 MG tablet Take 500 mg by mouth every 6 (six) hours as needed for mild pain.       amiodarone (PACERONE) 200 MG tablet TAKE ONE-HALF TABLET BY MOUTH EVERY TUES/THURS/SAT (Patient taking differently: Take 100 mg by mouth See admin instructions. TAKE ONE-HALF TABLET BY MOUTH EVERY TUES/THURS/SAT) 90 tablet 3   aspirin 81 MG tablet Take 1 tablet (81 mg total) by mouth daily.       carvedilol (COREG) 25 MG tablet TAKE 1 TABLET BY MOUTH TWICE DAILY (Patient taking differently: Take 25 mg by mouth 2 (two) times daily with a meal.) 180 tablet 1   Cholecalciferol (VITAMIN D) 1000 UNITS capsule Take 1,000 Units by mouth daily.       doxycycline (VIBRA-TABS) 100 MG tablet Take 1 tablet (100 mg total) by mouth 2 (two) times daily. 20 tablet 0   Fluticasone-Umeclidin-Vilant (TRELEGY ELLIPTA) 100-62.5-25 MCG/ACT AEPB Inhale 1 puff into the lungs daily. (Patient not taking: Reported on 12/02/2022) 2 each 0   furosemide (LASIX) 20 MG tablet Take 1 tablet (20 mg total) by mouth daily. 90 tablet 3   Ipratropium-Albuterol (COMBIVENT RESPIMAT) 20-100 MCG/ACT AERS respimat Inhale 1 puff into the lungs every 6 (six) hours as  needed for wheezing or shortness of breath. 4 g 0   JARDIANCE 10 MG TABS tablet TAKE 1/2 TABLET(5 MG) BY MOUTH DAILY (Patient taking differently: Take 12.5 mg by mouth daily.) 15 tablet 6   metFORMIN (GLUCOPHAGE) 500 MG tablet TAKE 1/2 TABLET BY MOUTH DAILY WITH A MEAL (Patient taking differently: Take 250 mg by mouth daily.) 90 tablet 0   rosuvastatin (CRESTOR) 10 MG tablet TAKE 1 TABLET(10 MG) BY MOUTH DAILY (Patient taking differently: Take 10 mg by mouth daily.) 90 tablet 3   sacubitril-valsartan (ENTRESTO) 97-103 MG Take 1 tablet by mouth 2 (two) times daily. 180 tablet 3   traMADol (ULTRAM) 50 MG tablet TAKE 1 TABLET(50 MG) BY MOUTH THREE TIMES DAILY AS NEEDED (Patient taking differently: Take 50 mg by mouth 3 (three) times daily between meals as needed for moderate pain.) 270 tablet 1          Home: Home Living Family/patient expects to be discharged to:: Private residence Living Arrangements: Spouse/significant other Available Help at Discharge: Family, Available 24 hours/day Type  of Home: House Home Access: Stairs to enter [employer] of Steps: 3 Entrance Stairs-Rails:  (+ rail) Home Layout: Two level, Bed/bath upstairs Alternate Level Stairs-Number of Steps: 14 Bathroom Shower/Tub: Optometrist: Yes Home Equipment: Radio producer - single point  Lives With: Spouse   Functional History: Prior Function Prior Level of Function : Independent/Modified Independent, Driving Mobility Comments: no AD in home, [facility], uses cane outside home ADLs Comments: independent ADls, light IADLs, drives and manages meds   Functional Status:  Mobility: Bed Mobility Overal bed mobility: Needs Assistance Bed Mobility: Rolling, Sidelying to Sit, Sit to Sidelying Rolling: Min guard Sidelying to sit: Min guard, HOB elevated Supine to sit: Min guard Sit to supine: Mod assist Sit to sidelying: Min guard, HOB elevated General  bed mobility comments: pt received seated EOB with OT present Transfers Overall transfer level: Needs assistance Equipment used: Rolling walker (2 wheels) Transfers: Sit to/from Stand Sit to Stand: Supervision Bed to/from chair/wheelchair/BSC transfer type:: Step pivot Step pivot transfers: Min assist, +2 safety/equipment General transfer comment: cues for safety and UE placement Ambulation/Gait Ambulation/Gait assistance: Min guard Gait Distance (Feet): 50 Feet Assistive device: Rolling walker (2 wheels) Gait Pattern/deviations: Step-through pattern, Trunk flexed, Drifts right/left, Decreased stride length General Gait Details: min guard for safety and line assist, pt with poor awareness of lines and needs dense safety cues; also postural cues needed Gait velocity: decreased Gait velocity interpretation: <1.31 ft/sec, indicative of household ambulator Pre-gait activities: Side stepped up side of bed with walker with +2 min assist Stairs: Yes Stairs assistance: Min guard Stair Management: Step to pattern, Forwards, With walker Number of Stairs: 10 General stair comments: single 7" platform step in room x10 reps in a row with BUE support to simulate B railings   ADL: ADL Overall ADL's : Needs assistance/impaired Eating/Feeding: Modified independent Grooming: Wash/dry hands, Standing, Supervision/safety Grooming Details (indicate cue type and reason): Standing at sink, Supervision with BUEs unsupported Upper Body Bathing: Minimal assistance, Sitting Lower Body Bathing: Moderate assistance, Sit to/from stand Upper Body Dressing : Moderate assistance Lower Body Dressing: Supervision/safety, Sitting/lateral leans Lower Body Dressing Details (indicate cue type and reason): Max A to don R sock seated EOB, Pt donned L sock using figure 4 technique with Supervision Toilet Transfer: Rolling walker (2 wheels), Cueing for safety Toilet Transfer Details (indicate cue type and reason): Pt  complete toilet t/f with SBA + RW. Needed cueing to slow down pace to allow for line/lead management. Toileting- Water quality scientist and Hygiene: Supervision/safety, Sit to/from stand Functional mobility during ADLs: Min guard, Rolling walker (2 wheels)   Cognition: Cognition Overall Cognitive Status: Within Functional Limits for tasks assessed Orientation Level: Oriented X4 Cognition Arousal/Alertness: Awake/alert Behavior During Therapy: WFL for tasks assessed/performed Overall Cognitive Status: Within Functional Limits for tasks assessed Area of Impairment: Safety/judgement Current Attention Level: Sustained Memory: Decreased short-term memory Following Commands: Follows one step commands consistently Safety/Judgement: Decreased awareness of safety Awareness: Emergent Problem Solving: Slow processing General Comments: HoH, needs reminders to wait for therapist to arrange lines prior to proceeding.   Physical Exam: Blood pressure (!) 109/58, pulse 72, temperature 98.6 F (37 C), temperature source Oral, resp. rate 17, height '5\' 2"'$  (1.575 m), weight 67 kg, SpO2 98 %. Physical Exam Constitutional:      Appearance: Normal appearance.  HENT:     Head: Normocephalic and atraumatic.     Ears:     Comments: A little HOH  Nose: Nose normal.     Mouth/Throat:     Mouth: Mucous membranes are moist.  Eyes:     Extraocular Movements: Extraocular movements intact.     Pupils: Pupils are equal, round, and reactive to light.  Cardiovascular:     Rate and Rhythm: Normal rate. Rhythm irregular.     Heart sounds: Murmur heard.  Pulmonary:     Effort: Pulmonary effort is normal. No respiratory distress.     Breath sounds: Normal breath sounds. No stridor. No wheezing.  Abdominal:     General: Bowel sounds are normal. There is no distension.     Palpations: Abdomen is soft.     Tenderness: There is no abdominal tenderness.  Musculoskeletal:        General: Normal range of motion.      Cervical back: Normal range of motion and neck supple.     Right lower leg: No edema.     Left lower leg: No edema.  Skin:    Comments: Skin moist/tacky along abdominal/inguinal folds. No frank erythema appreciated. Vac in left groin sealed.    Neurological:     Mental Status: She is alert.     Comments: Alert and oriented x 3. Normal insight and awareness. Intact Memory. Normal language and speech. Cranial nerve exam unremarkable. HOH, UE 5/5. LE 3+ prox to 4/5 distally. Sensory exam normal for light touch and pain in all 4 limbs. No limb ataxia or cerebellar signs. No abnormal tone appreciated.    Psychiatric:        Mood and Affect: Mood normal.        Behavior: Behavior normal.        Lab Results Last 48 Hours        Results for orders placed or performed during the hospital encounter of 01/10/23 (from the past 48 hour(s))  Glucose, capillary     Status: Abnormal    Collection Time: 02/16/23  9:18 PM  Result Value Ref Range    Glucose-Capillary 149 (H) 70 - 99 mg/dL      Comment: Glucose reference range applies only to samples taken after fasting for at least 8 hours.    Comment 1 Notify RN      Comment 2 Document in Chart    Magnesium     Status: None    Collection Time: 02/17/23  5:04 AM  Result Value Ref Range    Magnesium 2.1 1.7 - 2.4 mg/dL      Comment: Performed at [facility], [place] [address], [place], [place] [id number]  Basic metabolic panel     Status: Abnormal    Collection Time: 02/17/23  5:04 AM  Result Value Ref Range    Sodium 142 135 - 145 mmol/L    Potassium 4.0 3.5 - 5.1 mmol/L    Chloride 103 98 - 111 mmol/L    CO2 30 22 - 32 mmol/L    Glucose, Bld 108 (H) 70 - 99 mg/dL      Comment: Glucose reference range applies only to samples taken after fasting for at least 8 hours.    BUN 47 (H) 8 - 23 mg/dL    Creatinine, Ser 1.41 (H) 0.44 - 1.00 mg/dL    Calcium 9.8 8.9 - 10.3 mg/dL    GFR, Estimated 37 (L) >60 mL/min      Comment: (NOTE) Calculated  using the CKD-EPI Creatinine Equation (2021)      Anion gap 9 5 - 15  Comment: Performed at [facility], [place] [address], [place], [place] [address]  CBC     Status: Abnormal    Collection Time: 02/17/23  5:04 AM  Result Value Ref Range    WBC 5.7 4.0 - 10.5 K/uL    RBC 3.35 (L) 3.87 - 5.11 MIL/uL    Hemoglobin 10.5 (L) 12.0 - 15.0 g/dL    HCT 34.2 (L) 36.0 - 46.0 %    MCV 102.1 (H) 80.0 - 100.0 fL    MCH 31.3 26.0 - 34.0 pg    MCHC 30.7 30.0 - 36.0 g/dL    RDW 19.1 (H) 11.5 - 15.5 %    Platelets 290 150 - 400 K/uL    nRBC 0.0 0.0 - 0.2 %      Comment: Performed at [facility], [place] [address], [place], [place] [address]  Glucose, capillary     Status: Abnormal    Collection Time: 02/17/23  6:19 AM  Result Value Ref Range    Glucose-Capillary 141 (H) 70 - 99 mg/dL      Comment: Glucose reference range applies only to samples taken after fasting for at least 8 hours.    Comment 1 Notify RN      Comment 2 Document in Chart    Glucose, capillary     Status: Abnormal    Collection Time: 02/17/23 11:25 AM  Result Value Ref Range    Glucose-Capillary 123 (H) 70 - 99 mg/dL      Comment: Glucose reference range applies only to samples taken after fasting for at least 8 hours.  Glucose, capillary     Status: Abnormal    Collection Time: 02/17/23  4:17 PM  Result Value Ref Range    Glucose-Capillary 112 (H) 70 - 99 mg/dL      Comment: Glucose reference range applies only to samples taken after fasting for at least 8 hours.  Glucose, capillary     Status: Abnormal    Collection Time: 02/17/23  9:45 PM  Result Value Ref Range    Glucose-Capillary 131 (H) 70 - 99 mg/dL      Comment: Glucose reference range applies only to samples taken after fasting for at least 8 hours.    Comment 1 Notify RN      Comment 2 Document in Chart    Glucose, capillary     Status: Abnormal    Collection Time: 02/18/23  6:07 AM  Result Value Ref Range    Glucose-Capillary 104 (H) 70 - 99  mg/dL      Comment: Glucose reference range applies only to samples taken after fasting for at least 8 hours.    Comment 1 Notify RN      Comment 2 Document in Chart    Glucose, capillary     Status: Abnormal    Collection Time: 02/18/23  8:04 AM  Result Value Ref Range    Glucose-Capillary 104 (H) 70 - 99 mg/dL      Comment: Glucose reference range applies only to samples taken after fasting for at least 8 hours.  Glucose, capillary     Status: Abnormal    Collection Time: 02/18/23 12:41 PM  Result Value Ref Range    Glucose-Capillary 190 (H) 70 - 99 mg/dL      Comment: Glucose reference range applies only to samples taken after fasting for at least 8 hours.  Glucose, capillary     Status: Abnormal    Collection Time: 02/18/23  3:53 PM  Result Value Ref Range    Glucose-Capillary 103 (H) 70 - 99 mg/dL      Comment: Glucose reference range applies only to samples taken after fasting for at least 8 hours.      Imaging Results (Last 48 hours)  No results found.         Blood pressure (!) 109/58, pulse 72, temperature 98.6 F (37 C), temperature source Oral, resp. rate 17, height '5\' 2"'$  (1.575 m), weight 67 kg, SpO2 98 %.   Medical Problem List and Plan: 1. Functional deficits secondary to debility after multiple medical issues after acute lateral wall MI and cardiogenic shock.              -course complicated by right femoral pseudoaneurysm which ultimately required I&D, washout as well as sartorius muscle flap and vac placement             -patient may not yet shower             -ELOS/Goals: 7-10 days, mod I to supervision goals 2.  Antithrombotics: -DVT/anticoagulation:  Pharmaceutical: Eliquis             -antiplatelet therapy: plavix 3. Pain Management: scheduled tylenol and tramadol ordered. This is working for her.             -lidocaine patches prn 4. Mood/Behavior/Sleep: monitor sleep patterns             -melatonin PRN for sleep             -antipsychotic agents:  n/a 5. Neuropsych/cognition: This patient is capable of making decisions on her own behalf. 6. Skin/Wound Care: vac changes right groin Mondays and Thursday             -Add InterDry dressing to abdominal/inguinal fold to control moisture 7. Fluids/Electrolytes/Nutrition: encourage appropriate PO inatke             -check labs Monday morning  -po intake has been sporadic  -protein/nutritional supps have been ordered for moderate protein deficient malnutrition 8. Acute on chronic systolic CHF/Acute lateral MI/PAF/VT arrest 2/7  -s/p DDD ICD on 2/12  -continue amiodarone '200mg'$  daily   -HR controlled, regular  -volume mgt with demadex/aldactone  -cozaar 12.'5mg'$  daily 9. Right femoral pseudoaneurysm  -wound care as above  -s/p sartorius rmuscle flap  -complete course of IV zosyn 10. AKI on CKD IIIb  -Baseline Cr 1.5  -Labs ordered for tomorrow  -avoid nephrotoxic meds 11. Acute on chronic anemia  -s/p iron infusion  -check cbc in am  -optimize nutritional status 12. Vaginal yeast  -topical clotrimazole for 6 more doses 13. Cerumen impaction  -apparently attempts made to clear it  -outpt follow up recommended      Meredith Staggers, MD 02/18/2023

## 2023-02-19 NOTE — Progress Notes (Signed)
Inpatient Rehabilitation Admission Medication Review by a Pharmacist  A complete drug regimen review was completed for this patient to identify any potential clinically significant medication issues.  High Risk Drug Classes Is patient taking? Indication by Medication  Antipsychotic No   Anticoagulant Yes Apixaban for Afib  Antibiotic Yes Clotrimazole cream for yeast infection  Opioid Yes Tramadol for pain  Antiplatelet Yes Plavix for CAD  Hypoglycemics/insulin Yes SSI for hyperglycemia  Vasoactive Medication Yes Losartan, spironolactone, torsemide, amiodarone: heart failure, Afib, CAD, blood pressure, PVCs  Chemotherapy No   Other Yes Crestor: CAD Lidoderm, tylenol for pain Zofran prn nausea Simethicone prn flatulence Nitroglycerin prn for chest pain Melatonin prn sleep Duoneb prn wheezing or shortness of breath Mylanta prn indigestion or heartburn Hydrocortisone prn skin care     Type of Medication Issue Identified Description of Issue Recommendation(s)  Drug Interaction(s) (clinically significant)     Duplicate Therapy     Allergy     No Medication Administration End Date     Incorrect Dose     Additional Drug Therapy Needed     Significant med changes from prior encounter (inform family/care partners about these prior to discharge). Meds discontinued while inpatient: aspirin, Coreg, Lasix, Jardiance, metformin, Entresto Communicate medication changes with patient/family at discharge  Other       Clinically significant medication issues were identified that warrant physician communication and completion of prescribed/recommended actions by midnight of the next day:  No   Time spent performing this drug regimen review (minutes): 60    Thank you for allowing [place] to participate in this patients care. Jens Som, PharmD 02/19/2023 5:11 PM  **Pharmacist phone directory can be found on [email or web address] listed under [place]**

## 2023-02-19 NOTE — Discharge Summary (Signed)
Physician Discharge Summary   Patient: SAPPHIRE BING MRN: [id number] DOB: 1941-11-11  Admit date:     01/10/2023  Discharge date: 02/19/23  Discharge Physician: Jimmy Picket Aaban Griep   PCP: Tonia Ghent, MD   Recommendations at discharge:    Follow up renal function and electrolytes in 3 to 4 days.  Follow up with Dr Damita Dunnings in 7 to 10 days post discharge from inpatient rehab. Follow up with Vascular surgery for wound care.  Follow up with cardiology as scheduled.   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 *  Hospital Course: 82 year old woman presented with chest pain.  Found to have acute lateral wall MI secondary to distal left circumflex occlusion by cardiac catheterization, complicated by severe ischemic MR and acute systolic heart failure with low output.  Required placement of IABP which was later removed secondary to concern for limb ischemia.  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  Subsequently suffered a ventricular tachycardia arrest, ICD was placed, several trips to the OR for groin washout, debridement and wound VAC placement.  1/31 Underwent thrombin injection to right femoral PSA . Hgb drifting down 10.3>8.3>6.8  2/1 Started on amio drip to suppress PVCs. Got dose of IV lasix x1. CVP low.  2/4 Ruptured PSA. Returned to OR for repair. VAC in  place 2/5 Midodrine 5 mg three times daily added.  Back in A fib.  2/7 TEE with mod MR.  2/8 VT arrest--> shock x1 ROSC after 2 minutes. Given lidocaine and amio was increased to 60 mg.  2/9 Transvenous pacemaker placed for overdrive pacing with PMVT.  2/11 Milrinone restarted, Co-ox 49% 2/12 PICC removed. S/p [employer] ICD  2/14 Milrinone stopped . Amio drip stopped.  2/15 Returned to OR for washout and VAC change with sartorious muscle flap.  2/20 OR for washout and wound vac placement.   2/26 OR groin washout, debridement and vac change  3/5 ready for CIR 03/06 peer to peer call with insurance medical director, and CIR has been declined. Apparently patient's functional and cognitive function is too good to qualify for CIR level of services.  03/09 patient medically stable for CIR, added antifungal for vaginal yeast infection.  03/10 Patient is being transfer to CIR.   Assessment and Plan: No notes have been filed under this hospital service. Service: Hospitalist  Acute lateral wall myocardial infarction.  Cardiogenic shock Acute on chronic systolic CHF Severe ischemic MR Status post ICD placement 2/12  Continue with losartan, torsemide and spironolactone.    Paroxysmal atrial fibrillation VT arrest 2/7 S/p DCCV x 1 with CPR, ROSC after 2 minutes.  Polymorphic VT, appeared bradycardia-mediated with intermittent junctional rhythm. S/p DDD ICD on 2/12 Continue amiodarone 200 mg po daily.    Right femoral pseudoaneurysm Right groin wound, secondary infection Patient developed pseudoaneurysm following balloon pump, s/p thrombin injection by Dr. Carlis Abbott 1/31.  2/4 stat u/s showed recurrent PSA with active bleeding into PSA. S/p  PSA repair with VAC placement.  2/15 VAC change with sartorious muscle flap.  2/20, went to the OR for washout and wound VAC placement. 2/27, underwent wound VAC change in OR.  Tentative plan of wound VAC change at bedside on 3/1. Wound culture showed  polymicrobial growth showing moderate Morganella and Enterococcus with few  pseudomonas.   Completed IV Zosyn per vascular surgery Okay for discharge to CIR from vascular standpoint.  Wound VAC changes Mondays and Thursdays     AKI on CKD stage 3b Baseline creatinine 1.3-1.5.  Stable renal function with serum cr at 1,40 with K at 3,8 and serum bicarbonate at 29,  Na 142.  Plan to follow up renal function and electrolytes in 3 to 4 days.    Chronic anemia Severe iron deficiency Secondary to blood loss and iron deficiency given IV iron 2/18 Hemoglobin stable, at the time of her discharge is 10.5 hgb .    Cerumen impaction Usually addressed by outpatient specialist with scope and tweezers.  Inpatient attempts failed.  Follow-up as an outpatient.  Vaginal yeast, patient has been placed on topical clotrimazol to continue for total 7 days.   Moderate calorie protein malnutrition, continue with nutritional supplement.       Consultants: heart failure, cardiology, vascular surgery, electrophysiology.  Procedures performed: as above  Disposition: Rehabilitation facility Diet recommendation:  Cardiac diet DISCHARGE MEDICATION: Allergies as of 02/19/2023       Reactions   Ezetimibe-simvastatin Other (See Comments)   REACTION: Muscle aches (side effect)   Lipitor [atorvastatin] Other (See Comments)   Leg weakness    Claritin [loratadine]    Possible cause of nightmares.     Spironolactone    Elevated potassium/creatinine   Tamiflu [oseltamivir Phosphate] Other (See Comments)   nightmares   Pravastatin Sodium Other (See Comments)   REACTION: Muscle aches (side effect)   Sulfonamide Derivatives Nausea And Vomiting        Medication List     STOP taking these medications    aspirin 81 MG tablet   carvedilol 25 MG tablet Commonly known as: COREG   doxycycline 100 MG tablet Commonly known as: VIBRA-TABS   Entresto 97-103 MG Generic drug: sacubitril-valsartan    furosemide 20 MG tablet Commonly known as: LASIX   Jardiance 10 MG Tabs tablet Generic drug: empagliflozin   metFORMIN 500 MG tablet Commonly known as: GLUCOPHAGE   Trelegy Ellipta 100-62.5-25 MCG/ACT Aepb Generic drug: Fluticasone-Umeclidin-Vilant       TAKE these medications    acetaminophen 500 MG tablet Commonly known as: TYLENOL Take 500 mg by mouth every 6 (six) hours as needed for mild pain.   amiodarone 200 MG tablet Commonly known as: PACERONE Take 1 tablet (200 mg total) by mouth daily. What changed:  how much to take how to take this when to take this additional instructions   apixaban 2.5 MG Tabs tablet Commonly known as: ELIQUIS Take 1 tablet (2.5 mg total) by mouth 2 (two) times daily.   clopidogrel 75 MG tablet Commonly known as: PLAVIX Take 1 tablet (75 mg total) by mouth daily.   clotrimazole 1 % vaginal cream Commonly known as: GYNE-LOTRIMIN Place 1 Applicatorful vaginally at bedtime.   Combivent Respimat 20-100 MCG/ACT Aers respimat Generic drug: Ipratropium-Albuterol Inhale 1 puff into the lungs every 6 (six) hours as needed for wheezing or shortness of breath.   losartan 25 MG tablet Commonly known as: COZAAR Take 0.5 tablets (12.5 mg total) by mouth daily.   nutrition supplement (  JUVEN) Pack Take 1 packet by mouth 2 (two) times daily between meals.   feeding supplement Liqd Take 237 mLs by mouth 3 (three) times daily between meals.   rosuvastatin 10 MG tablet Commonly known as: CRESTOR TAKE 1 TABLET(10 MG) BY MOUTH DAILY What changed: See the new instructions.   spironolactone 25 MG tablet Commonly known as: ALDACTONE Take 0.5 tablets (12.5 mg total) by mouth daily.   torsemide 20 MG tablet Commonly known as: DEMADEX Take 1 tablet (20 mg total) by mouth daily.   traMADol 50 MG tablet Commonly known as: ULTRAM Take 1 tablet (50 mg total) by mouth every 12 (twelve) hours. What changed:  how much to take how to take  this when to take this additional instructions   Vitamin D 1000 units capsule Take 1,000 Units by mouth daily.               Discharge Care Instructions  (From admission, onward)           Start     Ordered   02/19/23 0000  Discharge wound care:       Comments: Wound vac per vascular surgery recommendations.   02/19/23 [facility] Follow up.   Why: on 2/23 at 240 pm for post ICD wound check Contact information: [place] [phone]               Discharge Exam: Danley Danker Weights   02/10/23 0630 02/11/23 0705 02/12/23 0705  Weight: 67.6 kg 67.4 kg 67 kg   BP 112/66 (BP Location: Left Arm)   Pulse 71   Temp 97.6 F (36.4 C) (Oral)   Resp 18   Ht '5\' 2"'$  (1.575 m)   Wt 67 kg   SpO2 96%   BMI 27.02 kg/m   Patient is feeling well, no dyspnea or chest pain, she has been working with pT and oT .  Neurology awake and alert ENT with mild pallor Cardiovascular with S1 and S2 present and rhythmic with no gallops, rubs or murmurs No JVD No lower extremity edema Respiratory with no rales or wheezing, no rhonchi Abdomen with no distention   Condition at discharge: stable  The results of significant diagnostics from this hospitalization (including imaging, microbiology, ancillary and laboratory) are listed below for reference.   Imaging Studies: [place] EKG SITE RITE  Result Date: 02/04/2023 If [facility] image not attached, placement could not be confirmed due to current cardiac rhythm.  DG CHEST PORT 1 VIEW  Result Date: 01/24/2023 CLINICAL DATA:  Placement of cardiac defibrillator. EXAM: PORTABLE CHEST 1 VIEW COMPARISON:  January 10, 2023. FINDINGS: Stable cardiomediastinal silhouette. Interval placement of left-sided defibrillator with leads in grossly good position. No pneumothorax is noted. Both lungs are  clear. The visualized skeletal structures are unremarkable. IMPRESSION: Interval placement of left-sided defibrillator with leads in grossly good position. Electronically Signed   By: Marijo Conception M.D.   On: 01/24/2023 17:32   EP PPM/ICD IMPLANT  Result Date: 01/23/2023 Conclusion: Successful insertion of a [employer] dual-chamber ICD in a patient with an ischemic cardiomyopathy, chronic systolic heart failure, marked sinus node dysfunction and polymorphic ventricular tachycardia. [name], MD    Microbiology: Results for orders placed or performed during the hospital encounter of 01/10/23  Surgical PCR screen  Status: None   Collection Time: 01/20/23  9:17 AM   Specimen: Nasal Mucosa; Nasal Swab  Result Value Ref Range Status   MRSA, PCR NEGATIVE NEGATIVE Final   Staphylococcus aureus NEGATIVE NEGATIVE Final    Comment: (NOTE) The Xpert SA Assay (FDA approved for NASAL specimens in patients 20 years of age and older), is one component of a comprehensive surveillance program. It is not intended to diagnose infection nor to guide or monitor treatment. Performed at [facility], [place] [address], [place], [place] [address]   Aerobic/Anaerobic Culture w Gram Stain (surgical/deep wound)     Status: None   Collection Time: 01/26/23 11:08 AM   Specimen: PATH Other; Tissue  Result Value Ref Range Status   Specimen Description GROIN  Final   Special Requests WOUND  Final   Gram Stain   Final    NO WBC SEEN NO ORGANISMS SEEN Performed at [facility], [address], [place], [place] [address]    Culture   Final    MODERATE MORGANELLA MORGANII MODERATE ENTEROCOCCUS FAECALIS FEW PSEUDOMONAS AERUGINOSA FEW KLEBSIELLA PNEUMONIAE MIXED ANAEROBIC FLORA PRESENT.  CALL LAB IF FURTHER IID REQUIRED.    Report Status 01/30/2023 FINAL  Final   Organism ID, Bacteria MORGANELLA MORGANII  Final   Organism ID, Bacteria ENTEROCOCCUS FAECALIS  Final   Organism ID, Bacteria  PSEUDOMONAS AERUGINOSA  Final   Organism ID, Bacteria KLEBSIELLA PNEUMONIAE  Final      Susceptibility   Enterococcus faecalis - MIC*    AMPICILLIN <=2 SENSITIVE Sensitive     VANCOMYCIN 2 SENSITIVE Sensitive     GENTAMICIN SYNERGY SENSITIVE Sensitive     * MODERATE ENTEROCOCCUS FAECALIS   Klebsiella pneumoniae - MIC*    AMPICILLIN >=32 RESISTANT Resistant     CEFEPIME <=0.12 SENSITIVE Sensitive     CEFTAZIDIME <=1 SENSITIVE Sensitive     CEFTRIAXONE <=0.25 SENSITIVE Sensitive     CIPROFLOXACIN <=0.25 SENSITIVE Sensitive     GENTAMICIN <=1 SENSITIVE Sensitive     IMIPENEM <=0.25 SENSITIVE Sensitive     TRIMETH/SULFA <=20 SENSITIVE Sensitive     AMPICILLIN/SULBACTAM 8 SENSITIVE Sensitive     PIP/TAZO <=4 SENSITIVE Sensitive     * FEW KLEBSIELLA PNEUMONIAE   Morganella morganii - MIC*    AMPICILLIN >=32 RESISTANT Resistant     CEFTAZIDIME <=1 SENSITIVE Sensitive     CIPROFLOXACIN <=0.25 SENSITIVE Sensitive     GENTAMICIN <=1 SENSITIVE Sensitive     IMIPENEM 2 SENSITIVE Sensitive     TRIMETH/SULFA <=20 SENSITIVE Sensitive     AMPICILLIN/SULBACTAM 16 INTERMEDIATE Intermediate     PIP/TAZO <=4 SENSITIVE Sensitive     * MODERATE MORGANELLA MORGANII   Pseudomonas aeruginosa - MIC*    CEFTAZIDIME 8 SENSITIVE Sensitive     CIPROFLOXACIN <=0.25 SENSITIVE Sensitive     GENTAMICIN 2 SENSITIVE Sensitive     IMIPENEM 1 SENSITIVE Sensitive     PIP/TAZO <=4 SENSITIVE Sensitive     CEFEPIME 1 SENSITIVE Sensitive     * FEW PSEUDOMONAS AERUGINOSA  Acid Fast Smear (AFB)     Status: None   Collection Time: 01/26/23 11:08 AM   Specimen: PATH Other; Tissue  Result Value Ref Range Status   AFB Specimen Processing Concentration  Final   Acid Fast Smear Negative  Final    Comment: (NOTE) Performed At: [facility] [place], [place] [id number] Rush Farmer MD [id number]    Source (AFB) GROIN  Final  Comment: WOUND Performed at [facility], [place] [address] [place], [place] [address]     Labs: CBC: Recent Labs  Lab 02/13/23 0451 02/14/23 0500 02/15/23 0545 02/16/23 0515 02/17/23 0504  WBC 5.5 4.9 4.7 6.0 5.7  HGB 9.4* 9.6* 9.9* 10.1* 10.5*  HCT 32.1* 31.0* 33.9* 34.7* 34.2*  MCV 103.9* 101.6* 104.3* 104.2* 102.1*  PLT 270 276 304 300 [id number]   Basic Metabolic Panel: Recent Labs  Lab 02/13/23 0451 02/14/23 0500 02/15/23 0545 02/16/23 0515 02/17/23 0504  NA 139 141 141 142 142  K 3.9 3.7 3.8 3.8 4.0  CL 103 105 105 103 103  CO2 31 30 33* 29 30  GLUCOSE 112* 100* 141* 121* 108*  BUN 42* 43* 48* 52* 47*  CREATININE 1.61* 1.46* 1.36* 1.40* 1.41*  CALCIUM 9.3 9.3 9.6 10.0 9.8  MG 2.0 2.1 2.1 2.2 2.1   Liver Function Tests: No results for input(s): "AST", "ALT", "ALKPHOS", "BILITOT", "PROT", "ALBUMIN" in the last 168 hours. CBG: Recent Labs  Lab 02/18/23 0804 02/18/23 1241 02/18/23 1553 02/18/23 2107 02/19/23 0628  GLUCAP 104* 190* 103* 143* 113*    Discharge time spent: greater than 30 minutes.  Signed: Tawni Millers, MD [facility] 02/19/2023

## 2023-02-19 NOTE — Progress Notes (Signed)
Signed     PMR Admission Coordinator Pre-Admission Assessment   Patient: Natasha Woodard is an 82 y.o., female MRN: [id number] DOB: 1941/05/25 Height: '5\' 2"'$  (157.5 cm) Weight: 67 kg                                                                                                                                                  Insurance Information HMO: yes    PPO:      PCP:      IPA:      80/20:      OTHER:  PRIMARY: U[employer] medicare      Policy#: [id number]      Subscriber: pt CM Name: Otila Kluver at [facility] appeals      Phone#: [phone] option #7     Fax#: [phone] Pre-Cert#: [id number] Initial denial and appealed on 3/7 . Approved for 7 days from date of admit     Employer:  Benefits:  Phone #: [phone]     Name: 3/5 Eff. Date: 12/12/22     Deduct: none      Out of Pocket Max: $3600      Life Max: none  CIR: $295 co pay per day days 1 until 5;      SNF: no copay per day days 1 until 20; $203 co pay per day days 21 until 100 Outpatient: $20 per visit     Co-Pay: visits per medical neccesity Home Health: 100%      Co-Pay: visits per medical neccesity DME: 80%     Co-Pay: 20% Providers: in network  SECONDARY: none   Financial Counselor:       Phone#:    The Engineer, [employer]" for patients in Inpatient Rehabilitation Facilities with attached "Privacy Act [place] Records" was provided and verbally reviewed with: Patient and Family   Emergency Contact Information Contact Information       Name Relation Home Work Mobile    [place]     [phone]    Idamay Sister [phone]        Taydem, Gemmer Spouse [phone]             Current Medical History  Patient Admitting Diagnosis: Debility   History of Present Illness: 82 y.o. female who presented on January 10, 2023 with shortness of breath and chest pain. Past medical history of PVCs on low dose amiodarone, dilated cardiomyopathy with improvement in LVEF to 50 to 55% as of  February 2023, and mild to moderate nonobstructive CAD by cath in 2019.    She was admitted with an acute lateral wall MI and was also found to have significant akinesis/hypokinesis with severe mitral regurgitation.  An IABP was placed in the Cath Lab.  Unfortunately, overnight she developed a hematoma to the IABP access site with associated limb ischemia and the IABP was  removed.  Patient was seen by vascular surgery for assessment of a right femoral pseudoaneurysm and a thrombin injection was performed.  On 01/15/2023 patient developed swelling in her right groin due to a significant hematoma which rapidly extended down the leg.  Patient underwent emergent right groin exploration and hematoma evacuation with right profunda femoris repair by vascular surgery.  On 01/19/2023 patient went into VT arrest in 01/20/2019 for a transvenous pacemaker placed with an ICD placed on 2/12.  On 01/24/2023 patient had her staples removed and a wound VAC replaced in the right groin area due to incisional breakdown and underlying tissue necrosis.  She went to the OR on 2/15 for washout, debridement, sartorious muscle flap and VAC replacement by Dr. Virl Cagey.  She has been followed essentially weekly by vascular surgery for VAC changes as well as wound washouts in the OR, the most recent of which was on February 06, 2023.  VAC changes have been converted to bedside and now wound care ostomy nurse is managing the VAC changes.  Patient has been up with PT and OT addressing self-care and mobility.  Most recently she has been min assist for sit to stand transfers and min assist 100 feet for gait with prolonged rest breaks due to the stamina and generalized weakness.  ADLs have been limited thus far due to generalized weakness and stamina.   Patient's medical record from [facility] has been reviewed by the rehabilitation admission coordinator and physician.   Past Medical History      Past Medical History:  Diagnosis Date    AICD (automatic cardioverter/defibrillator) present     Calcific tendonitis 2008    Treatment of left leg   Cardiomyopathy ([place]) 06/2017    a) Echo: EF 25-30%. GR 1-2 DD w/ elevated LVEDP. Mild valvular Dz; b) Cardiac MRI 2/'19: frequent PVCs (diffiuclt to interpret) - EF ~27% w/ diffuse HK.  No evidence of infarct, infiltrative Dz or myocarditis. -- ? if related to PVCs. c) f/u Echo 6/'19: EF 35-40%. Gr 1 DD. Diffuse HK.-> d) Jan 2020 EF 50 to 55%.  Mod MAC, mod LAE.  Ao Sclerosis; e) Echo 1/'21 - EF 60-65%, Gr II DD. Mild Mod MR.    Chronic combined systolic and diastolic CHF, NYHA class 2 and ACA/AHA stage C      Now essentially resolved, back to simply diastolic CHF   Coronary artery disease, non-occlusive      mild-moderate CAD 06/30/17 cath   CTS (carpal tunnel syndrome)     Diabetes mellitus      Type II   Dysrhythmia      patient said that she cant remember what it is   Frequent unifocal PVCs 01/2018    Event Monitor: NSR, W/ frequent multifocal PVCs (9%-down from 30% prior to amiodarone) and PACs.  Nighttime bradycardia suggestive of OSA.';  Zio patch September 2023: PVC burden now 6.1%.   Hiatal hernia      with reflux   Hyperlipidemia      Statin intolerant   Hypertension     Ichthyosis congenita     Intermittent claudication of both lower extremities due to atherosclerosis ([place]) 08/22/2021    LEA Dopplers 09/09/2021:  Right: Total occlusion noted in the superficial femoral artery. Atherosclerosis noted throughout extremity, see note above. Three vessel runoff.  Left: Total occlusion noted in the superficial femoral artery and/or popliteal artery. Total occlusion noted in the distal anterior tibial artery. Athereosclerosis noted throughout extremity.    NSVD (normal  spontaneous vaginal delivery) 1971 & 1972   Obesity     Plantar fasciitis      Left   Presence of permanent cardiac pacemaker     Pulmonary nodule      Imaged multiple times and benign appearing   Sleep apnea      Wears glasses      Has the patient had major surgery during 100 days prior to admission? Yes   Family History  family history includes Cancer in her brother; Diabetes in her father and mother; Heart disease in her brother, father, and mother; Hypertension in an other family member.   Current Medications    Current Facility-Administered Medications:    0.9 %  sodium chloride infusion, 250 mL, Intravenous, PRN, Schuh, McKenzi P, PA-C, Last Rate: 10 mL/hr at 01/23/23 1235, 250 mL at 01/23/23 1235   acetaminophen (TYLENOL) tablet 650 mg, 650 mg, Oral, Q6H, Samuella Cota, MD, 650 mg at 02/19/23 0558   alum & mag hydroxide-simeth (MAALOX/MYLANTA) 200-200-20 MG/5ML suspension 30 mL, 30 mL, Oral, Q6H PRN, Schuh, McKenzi P, PA-C, 30 mL at 01/20/23 1557   amiodarone (PACERONE) tablet 200 mg, 200 mg, Oral, Daily, Arrien, Jimmy Picket, MD   apixaban Arne Cleveland) tablet 2.5 mg, 2.5 mg, Oral, BID, Schuh, McKenzi P, PA-C, 2.5 mg at 02/18/23 2215   Chlorhexidine Gluconate Cloth 2 % PADS 6 each, 6 each, Topical, Daily, Schuh, McKenzi P, PA-C, 6 each at 02/18/23 1103   clopidogrel (PLAVIX) tablet 75 mg, 75 mg, Oral, Daily, Schuh, McKenzi P, PA-C, 75 mg at 02/18/23 1103   clotrimazole (GYNE-LOTRIMIN) vaginal cream 1 Applicatorful, 1 Applicatorful, Vaginal, QHS, Arrien, Jimmy Picket, MD, 1 Applicatorful at [id number] 2100   feeding supplement (ENSURE ENLIVE / ENSURE PLUS) liquid 237 mL, 237 mL, Oral, TID BM, Schuh, McKenzi P, PA-C, 237 mL at 02/18/23 2215   hydrocortisone cream 1 %, , Topical, PRN, Schuh, McKenzi P, PA-C, Given at 01/25/23 1340   insulin aspart (novoLOG) injection 0-15 Units, 0-15 Units, Subcutaneous, TID WC, Schuh, McKenzi P, PA-C, 3 Units at 02/18/23 1353   ipratropium-albuterol (DUONEB) 0.5-2.5 (3) MG/3ML nebulizer solution 3 mL, 3 mL, Nebulization, Q6H PRN, Schuh, McKenzi P, PA-C   lidocaine (LIDODERM) 5 % 1 patch, 1 patch, Transdermal, Q24H, Schuh, McKenzi P, PA-C, 1 patch at 02/16/23  2342   lidocaine (LIDODERM) 5 % 1 patch, 1 patch, Transdermal, Q24H, Schuh, McKenzi P, PA-C, 1 patch at 02/18/23 1356   losartan (COZAAR) tablet 12.5 mg, 12.5 mg, Oral, Daily, Sira, Zackery, MD, 12.5 mg at 02/18/23 1102   melatonin tablet 5 mg, 5 mg, Oral, QHS PRN, Dagoberto Ligas, PA-C, 5 mg at 02/18/23 2213   multivitamin with minerals tablet 1 tablet, 1 tablet, Oral, Daily, Dahal, Binaya, MD, 1 tablet at 02/18/23 1102   nitroGLYCERIN (NITROSTAT) SL tablet 0.4 mg, 0.4 mg, Sublingual, Q5 min PRN, Schuh, McKenzi P, PA-C, 0.4 mg at 01/10/23 1044   nutrition supplement (JUVEN) (JUVEN) powder packet 1 packet, 1 packet, Oral, BID BM, Schuh, McKenzi P, PA-C, 1 packet at 02/18/23 1353   ondansetron (ZOFRAN) injection 4 mg, 4 mg, Intravenous, Q6H PRN, Schuh, McKenzi P, PA-C, 4 mg at 01/10/23 2326   Oral care mouth rinse, 15 mL, Mouth Rinse, PRN, Schuh, McKenzi P, PA-C, 15 mL at 01/13/23 1938   rosuvastatin (CRESTOR) tablet 10 mg, 10 mg, Oral, QHS, Schuh, McKenzi P, PA-C, 10 mg at 02/18/23 2213   simethicone (MYLICON) chewable tablet 80 mg, 80 mg, Oral, Q6H PRN, Schuh, McKenzi P,  PA-C, 80 mg at 02/17/23 1808   sodium chloride flush (NS) 0.9 % injection 10-40 mL, 10-40 mL, Intracatheter, PRN, Schuh, McKenzi P, PA-C, 20 mL at 02/09/23 0349   sodium chloride flush (NS) 0.9 % injection 3 mL, 3 mL, Intravenous, Q12H, Schuh, McKenzi P, PA-C, 3 mL at 02/18/23 2214   spironolactone (ALDACTONE) tablet 12.5 mg, 12.5 mg, Oral, Daily, Ferrel Logan, Alma L, NP, 12.5 mg at 02/18/23 1102   torsemide (DEMADEX) tablet 20 mg, 20 mg, Oral, Daily, Dahal, Binaya, MD, 20 mg at 02/18/23 1103   traMADol (ULTRAM) tablet 50 mg, 50 mg, Oral, Q12H, Samuella Cota, MD, 50 mg at 02/19/23 0600   Patients Current Diet:  Diet Order                  Diet regular Room service appropriate? Yes; Fluid consistency: Thin  Diet effective now                       Precautions / Restrictions Precautions Precautions: Fall, ICD/Pacemaker,  Other (comment) Precaution Comments: wound VAC rt groin, LUE ICD placed 2/12 no lifting LUE past shoulder or L shld resistance exercise x6 weeks Restrictions Weight Bearing Restrictions: No Other Position/Activity Restrictions: see PT precs for LUE precs    Has the patient had 2 or more falls or a fall with injury in the past year?No   Prior Activity Level Community (5-7x/wk): Independent, active, drives   Prior Functional Level Prior Function Prior Level of Function : Independent/Modified Independent, Driving Mobility Comments: no AD in home, [facility], uses cane outside home ADLs Comments: independent ADls, light IADLs, drives and manages meds   Self Care: Did the patient need help bathing, dressing, using the toilet or eating?  Independent   Indoor Mobility: Did the patient need assistance with walking from room to room (with or without device)? Independent   Stairs: Did the patient need assistance with internal or external stairs (with or without device)? Independent   Functional Cognition: Did the patient need help planning regular tasks such as shopping or remembering to take medications? Independent   Patient Information Are you of Hispanic, Latino/a,or Spanish origin?: A. No, not of Hispanic, Latino/a, or Spanish origin What is your race?: B. Black or African American Do you need or want an interpreter to communicate with a doctor or health care staff?: 0. No   Patient's Response To:  Health Literacy and Transportation Is the patient able to respond to health literacy and transportation needs?: Yes Health Literacy - How often do you need to have someone help you when you read instructions, pamphlets, or other written material from your doctor or pharmacy?: Never In the past 12 months, has lack of transportation kept you from medical appointments or from getting medications?: No In the past 12 months, has lack of transportation kept you from meetings, work, or  from getting things needed for daily living?: No   Home Assistive Devices / [place] Devices/Equipment: Radio producer (specify quad or straight) Home Equipment: Cane - single point   Prior Device Use: Indicate devices/aids used by the patient prior to current illness, exacerbation or injury? Cane outside the home   Current Functional Level Cognition   Overall Cognitive Status: Within Functional Limits for tasks assessed Current Attention Level: Sustained Orientation Level: Oriented X4 Following Commands: Follows one step commands consistently Safety/Judgement: Decreased awareness of safety General Comments: HoH, needs reminders to wait for therapist to arrange lines prior  to proceeding.    Extremity Assessment (includes Sensation/Coordination)   Upper Extremity Assessment: Generalized weakness LUE Deficits / Details: generalized weakness  Lower Extremity Assessment: Defer to PT evaluation     ADLs   Overall ADL's : Needs assistance/impaired Eating/Feeding: Modified independent Grooming: Wash/dry hands, Standing, Supervision/safety Grooming Details (indicate cue type and reason): Standing at sink, Supervision with BUEs unsupported Upper Body Bathing: Minimal assistance, Sitting Lower Body Bathing: Moderate assistance, Sit to/from stand Upper Body Dressing : Moderate assistance Lower Body Dressing: Supervision/safety, Sitting/lateral leans Lower Body Dressing Details (indicate cue type and reason): Max A to don R sock seated EOB, Pt donned L sock using figure 4 technique with Supervision Toilet Transfer: Rolling walker (2 wheels), Cueing for safety Toilet Transfer Details (indicate cue type and reason): Pt complete toilet t/f with SBA + RW. Needed cueing to slow down pace to allow for line/lead management. Toileting- Water quality scientist and Hygiene: Supervision/safety, Sit to/from stand Functional mobility during ADLs: Min guard, Rolling walker (2 wheels)     Mobility    Overal bed mobility: Needs Assistance Bed Mobility: Rolling, Sidelying to Sit, Sit to Sidelying Rolling: Min guard Sidelying to sit: Min guard, HOB elevated Supine to sit: Min guard Sit to supine: Mod assist Sit to sidelying: Min guard, HOB elevated General bed mobility comments: pt received seated EOB with OT present     Transfers   Overall transfer level: Needs assistance Equipment used: Rolling walker (2 wheels) Transfers: Sit to/from Stand Sit to Stand: Supervision Bed to/from chair/wheelchair/BSC transfer type:: Step pivot Step pivot transfers: Min assist, +2 safety/equipment General transfer comment: cues for safety and UE placement     Ambulation / Gait / Stairs / Wheelchair Mobility   Ambulation/Gait Ambulation/Gait assistance: Counsellor (Feet): 50 Feet Assistive device: Rolling walker (2 wheels) Gait Pattern/deviations: Step-through pattern, Trunk flexed, Drifts right/left, Decreased stride length General Gait Details: min guard for safety and line assist, pt with poor awareness of lines and needs dense safety cues; also postural cues needed Gait velocity: decreased Gait velocity interpretation: <1.31 ft/sec, indicative of household ambulator Pre-gait activities: Side stepped up side of bed with walker with +2 min assist Stairs: Yes Stairs assistance: Min guard Stair Management: Step to pattern, Forwards, With walker Number of Stairs: 10 General stair comments: single 7" platform step in room x10 reps in a row with BUE support to simulate B railings     Posture / Balance Dynamic Sitting Balance Sitting balance - Comments: no UE support with static sitting Balance Overall balance assessment: Needs assistance Sitting-balance support: Feet supported, No upper extremity supported Sitting balance-Leahy Scale: Good Sitting balance - Comments: no UE support with static sitting Standing balance support: No upper extremity supported, Reliant on assistive device  for balance Standing balance-Leahy Scale: Poor Standing balance comment: reliant on RW for dynamic tasks     Special needs/care consideration   WOC Nurse Consult Note: R groin washout and debridement 2/26, vascular changed first NPWT dressing 2/29, WOC consult for future NPWT dressing changes Mon/Thurs Reason for Consult: NPWT  Wound type: surgical  Pressure Injury POA: NA, not pressure  Measurement: 4 cms x 8 cms x 2 cms  Wound bed: 80% red moist 20% yellow fibrinous tissue  Drainage (amount, consistency, odor)  minimal serosanguinous  Periwound: intact  Dressing procedure/placement/frequency: Removed old NPWT dressing Cleansed wound with normal saline Periwound skin protected with skin barrier wipe; did utilize 2" barrier ring that was cut in two and flattened out for creases  to left and right of wound    Filled wound with  _1 piece white foam and 1 piece black foam   Sealed NPWT dressing at 145m HG   Patient deferred any pain medication prior to dressing change; tolerated well    WOC nurse will continue to provide NPWT dressing change MON/THURS per vascular  due to the complexity of the dressing change.    Next NPWT dressing change THURS 02/16/2023.    Thank you,      Heather Bullins MSN, RN-BC, CWON   ICD placed this admission    Previous Home Environment  Living Arrangements: Spouse/significant other  Lives With: Spouse Available Help at Discharge: Family, Available 24 hours/day Type of Home: House Home Layout: Two level, Bed/bath upstairs Alternate Level Stairs-Number of Steps: 14 Home Access: Stairs to enter Entrance Stairs-Rails:  (+ rail) ETechnical brewerof Steps: 3 Bathroom Shower/Tub: TOptometrist Yes How Accessible: Accessible via walker [place] No   Discharge Living Setting Plans for Discharge Living Setting: Patient's home, Lives with (comment) (spouse) Type of Home at Discharge:  House Discharge Home Layout: Two level, Bed/bath upstairs Alternate Level Stairs-Number of Steps: 14 Discharge Home Access: Stairs to enter Entrance Stairs-Number of Steps: 3 Discharge Bathroom Shower/Tub: Tub/shower unit Discharge Bathroom Toilet: Standard Discharge Bathroom Accessibility: Yes How Accessible: Accessible via walker Does the patient have any problems obtaining your medications?: No   Social/Family/Support Systems Patient Roles: Spouse, Parent Contact Information: spouse, [name] Anticipated Caregiver: spouse and family Anticipated CAmbulance personInformation: see contacts Ability/Limitations of Caregiver: none Caregiver Availability: 24/7 Discharge Plan Discussed with Primary Caregiver: Yes Is Caregiver In Agreement with Plan?: Yes Does Caregiver/Family have Issues with Lodging/Transportation while Pt is in Rehab?: No   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   Goals Patient/Family Goal for Rehab: Mod I to supervision with PT and OT Expected length of stay: ELOS 8 to 12 days or less Additional Information: spouse, patient and son are aware that she will not remain at CIR until groin wound completely healed. [place] likely [place] VAC changes Pt/Family Agrees to Admission and willing to participate: Yes Program Orientation Provided & Reviewed with Pt/Caregiver Including Roles  & Responsibilities: Yes Additional Information Needs: spouse hesistant on discharge home with need for wound care to right groin   Spouse , patient and son aware that patient will not remain admitted to CIR until right groin wound completely healed. [facility]   Decrease burden of Care through IP rehab admission: n/a   Possible need for SNF placement upon discharge:not anticipated   Patient Condition: This patient's medical and functional status has changed since the consult dated: 02/14/23 in which the Rehabilitation  Physician determined and documented that the patient's condition is appropriate for intensive rehabilitative care in an inpatient rehabilitation facility. See "History of Present Illness" (above) for medical update. Functional changes are: overall min guard assist. Patient's medical and functional status update has been discussed with the Rehabilitation physician and patient remains appropriate for inpatient rehabilitation. Will admit to inpatient rehab today.   Preadmission Screen Completed By: BDanne BaxterRN MSN   02/19/2023 9:19 AM ______________________________________________________________________   Discussed status with Dr. SNaaman Plummeron 02/19/23 at 9:19 AM and received approval for admission today.   Admission Coordinator: BDanne BaxterRN MSN, with time 9:19 AM /Sudie Grumbling03/10/24

## 2023-02-19 NOTE — Progress Notes (Signed)
INPATIENT REHABILITATION ADMISSION NOTE   Arrival Method:  chair     Mental Orientation:alert   Assessment: done   Skin: MASD; wound vac   IV'S:none   Pain:none   Tubes and Drains:wound vac   Safety Measures:done   Vital Signs: done  Height and Weight:done   Rehab Orientation: done  Family:husband; son in room    Notes:

## 2023-02-19 NOTE — TOC Transition Note (Signed)
Transition of Care [facility]) - CM/SW Discharge Note   Patient Details  Name: Natasha Woodard MRN: [id number] Date of Birth: 26-Oct-1941  [facility] CM/SW Contact:  Bethena Roys, RN Phone Number: 02/19/2023, 1:20 PM   Clinical Narrative:   Case Manager received notification that patient has bed available on the inpatient rehab unit. Patient will transition to CIR today. No further needs identified at this time.   Final next level of care: IP Rehab Facility Barriers to Discharge: No Barriers Identified   Patient Goals and CMS Choice CMS Medicare.gov Compare Post Acute Care list provided to:: Patient Represenative (must comment) (husband) Choice offered to / list presented to : Spouse  Discharge Plan and Services Additional resources added to the After Visit Summary for     Discharge Planning Services: CM Consult Post Acute Care Choice: IP Rehab            Social Determinants of Health (SDOH) Interventions SDOH Screenings   Food Insecurity: No Food Insecurity (01/12/2023)  Housing: Low Risk  (01/12/2023)  Transportation Needs: No Transportation Needs (01/12/2023)  Utilities: Not At Risk (01/12/2023)  Depression (PHQ2-9): Low Risk  (05/12/2022)  Financial Resource Strain: Low Risk  (05/12/2022)  Physical Activity: Sufficiently Active (05/12/2022)  Stress: No Stress Concern Present (05/12/2022)  Tobacco Use: Medium Risk (02/07/2023)   Readmission Risk Interventions     No data to display

## 2023-02-19 NOTE — Progress Notes (Signed)
Signed      Expand All Collapse All          Physical Medicine and Rehabilitation Consult Reason for Consult:Weakness, functional deficits Referring Physician: Sarajane Jews     HPI: Natasha Woodard is a 82 y.o. female who presented on January 10, 2023 with shortness of breath and chest pain. She was admitted with an acute lateral wall MI and was also found to have significant akinesis/hypokinesis with severe mitral regurgitation.  An IABP was placed in the Cath Lab.  Unfortunately, overnight she developed a hematoma to the IABP access site with associated limb ischemia and the IABP was removed.  Patient was seen by vascular surgery for assessment of a right femoral pseudoaneurysm and a thrombin injection was performed.  On 01/15/2023 patient developed swelling in her right groin due to a significant hematoma which rapidly extended down the leg.  Patient underwent emergent right groin exploration and hematoma evacuation with right profunda femoris repair by vascular surgery.  On 01/19/2023 patient went into VT arrest in 01/20/2019 for a transvenous pacemaker placed with an ICD placed on 2/12.  On 01/24/2023 patient had her staples removed and a wound VAC replaced in the right groin area due to incisional breakdown and underlying tissue necrosis.  She went to the OR on 2/15 for washout, debridement, sartorious muscle flap and VAC replacement by Dr. Virl Cagey.  She has been followed essentially weekly by vascular surgery for VAC changes as well as wound washouts in the OR, the most recent of which was on February 06, 2023.  VAC changes have been converted to bedside and now wound care ostomy nurse is managing the VAC changes.  Patient has been up with PT and OT addressing self-care and mobility.  Most recently she has been min assist for sit to stand transfers and min assist 100 feet for gait with prolonged rest breaks due to the stamina and generalized weakness.  ADLs have been limited thus far due to generalized  weakness and stamina.     Review of Systems  Constitutional:  Negative for fever.  HENT: Negative.    Eyes:  Negative for blurred vision.  Respiratory:  Positive for cough and shortness of breath.   Cardiovascular:  Positive for leg swelling.  Gastrointestinal:  Negative for nausea and vomiting.  Genitourinary:  Negative for dysuria.  Musculoskeletal:  Positive for back pain and joint pain.  Skin:        Wound vac  Neurological:  Positive for weakness. Negative for headaches.  Psychiatric/Behavioral:  Negative for depression.         Past Medical History:  Diagnosis Date   AICD (automatic cardioverter/defibrillator) present     Calcific tendonitis 2008    Treatment of left leg   Cardiomyopathy ([place]) 06/2017    a) Echo: EF 25-30%. GR 1-2 DD w/ elevated LVEDP. Mild valvular Dz; b) Cardiac MRI 2/'19: frequent PVCs (diffiuclt to interpret) - EF ~27% w/ diffuse HK.  No evidence of infarct, infiltrative Dz or myocarditis. -- ? if related to PVCs. c) f/u Echo 6/'19: EF 35-40%. Gr 1 DD. Diffuse HK.-> d) Jan 2020 EF 50 to 55%.  Mod MAC, mod LAE.  Ao Sclerosis; e) Echo 1/'21 - EF 60-65%, Gr II DD. Mild Mod MR.    Chronic combined systolic and diastolic CHF, NYHA class 2 and ACA/AHA stage C      Now essentially resolved, back to simply diastolic CHF   Coronary artery disease, non-occlusive      mild-moderate  CAD 06/30/17 cath   CTS (carpal tunnel syndrome)     Diabetes mellitus      Type II   Dysrhythmia      patient said that she cant remember what it is   Frequent unifocal PVCs 01/2018    Event Monitor: NSR, W/ frequent multifocal PVCs (9%-down from 30% prior to amiodarone) and PACs.  Nighttime bradycardia suggestive of OSA.';  Zio patch September 2023: PVC burden now 6.1%.   Hiatal hernia      with reflux   Hyperlipidemia      Statin intolerant   Hypertension     Ichthyosis congenita     Intermittent claudication of both lower extremities due to atherosclerosis ([place]) 08/22/2021     LEA Dopplers 09/09/2021:  Right: Total occlusion noted in the superficial femoral artery. Atherosclerosis noted throughout extremity, see note above. Three vessel runoff.  Left: Total occlusion noted in the superficial femoral artery and/or popliteal artery. Total occlusion noted in the distal anterior tibial artery. Athereosclerosis noted throughout extremity.    NSVD (normal spontaneous vaginal delivery) 1971 & 1972   Obesity     Plantar fasciitis      Left   Presence of permanent cardiac pacemaker     Pulmonary nodule      Imaged multiple times and benign appearing   Sleep apnea     Wears glasses           Past Surgical History:  Procedure Laterality Date   ABDOMINAL HYSTERECTOMY   1975    TAH-BSO   ABSCESS DRAINAGE   1972    right breast   APPLICATION OF WOUND VAC Right 01/15/2023    Procedure: APPLICATION OF INCISIONAL WOUND VAC;  Surgeon: Broadus John, MD;  Location: [facility] OR;  Service: Vascular;  Laterality: Right;   APPLICATION OF WOUND VAC Right 01/26/2023    Procedure: APPLICATION OF WOUND VAC RIGHT GROIN WASHOUT AND SARTORIUS MUSCLE FLAP;  Surgeon: Broadus John, MD;  Location: [place];  Service: Vascular;  Laterality: Right;   ARTERY REPAIR Right 01/15/2023    Procedure: RIGHT PROFUNDA ARTERY REPAIR WITH 2000G MYRIAD MORCELLS;  Surgeon: Broadus John, MD;  Location: [place];  Service: Vascular;  Laterality: Right;   BREAST BIOPSY Left 01/14/2009    Stereo- Benign   CARDIAC MRI   01/2018    Difficult to interpret 2/2 PVCs. Normal LV size - EF ~27% with diffuse HK. Mild RV dilation - normal fxn. -- NO MYOCARDIAL LGI => no definitive evidence of prior MI, Infiltrative Dz or Myocarditis -- suspect NICM, possibly related to PVCs.   CARPAL TUNNEL RELEASE Right 07/02/2014    Procedure: RIGHT CARPAL TUNNEL RELEASE;  Surgeon: Wynonia Sours, MD;  Location: [place];  Service: Orthopedics;  Laterality: Right;   CARPAL TUNNEL RELEASE Left 06/11/2015    Procedure: LEFT  CARPAL TUNNEL RELEASE;  Surgeon: Daryll Brod, MD;  Location: [place];  Service: Orthopedics;  Laterality: Left;  REGIONAL/FAB   COLONOSCOPY       corn removal   1968    right   CORONARY STENT INTERVENTION N/A 01/10/2023    Procedure: CORONARY STENT INTERVENTION;  Surgeon: Troy Sine, MD;  Location: [place] [facility];  Service: Cardiovascular;  Laterality: N/A;   GROIN DEBRIDEMENT Right 02/06/2023    Procedure: RIGHT GROIN DEBRIDEMENT WITH WOUND VAC CHANGE;  Surgeon: Broadus John, MD;  Location: [place];  Service: Vascular;  Laterality: Right;   HEMATOMA EVACUATION  Right 01/15/2023    Procedure: EVACUATION HEMATOMA RIGHT GROIN;  Surgeon: Broadus John, MD;  Location: [place];  Service: Vascular;  Laterality: Right;   Holter Monitor   06/2017    ~17,000 PVC beats - majority were singlets, some couplets. 44 brief 3-7 beat runs of NSVT. Also noted were less frequent PACs with 11 runs (longest 15 beats)   IABP INSERTION Right 01/10/2023    Procedure: IABP Insertion;  Surgeon: Troy Sine, MD;  Location: [place] [facility];  Service: Cardiovascular;  Laterality: Right;   ICD IMPLANT N/A 01/23/2023    Procedure: ICD IMPLANT;  Surgeon: Evans Lance, MD;  Location: [place] [facility];  Service: Cardiovascular;  Laterality: N/A;   INCISION AND DRAINAGE OF WOUND Right 01/31/2023    Procedure: IRRIGATION AND DEBRIDEMENT RIGHT GROIN WOUND AND WOUND VAC CHANGE;  Surgeon: Broadus John, MD;  Location: [place];  Service: Vascular;  Laterality: Right;   OPEN REDUCTION INTERNAL FIXATION (ORIF) DISTAL RADIAL FRACTURE Left 07/21/2017    Procedure: OPEN REDUCTION INTERNAL FIXATION (ORIF) LEFT DISTAL RADIAL FRACTURE;  Surgeon: Renette Butters, MD;  Location: [place];  Service: Orthopedics;  Laterality: Left;   RIGHT/LEFT HEART CATH AND CORONARY ANGIOGRAPHY N/A 06/30/2017    Procedure: Right/Left Heart Cath and Coronary Angiography;  Surgeon: Leonie Man, MD;  Location: [facility] INVASIVE CV  LAB:  pRCA 55%, pCx 40%, OM1 45%, mCx 50%, D2 50% - LVEF 25-35%. Moderately elevated LVEDP(26 mmHg with PCWP 16 mmHg).  FICK CO/CI: 4.47/2.48. PA pressures 47/14 mmHg with a mean of 27 mm.   RIGHT/LEFT HEART CATH AND CORONARY ANGIOGRAPHY N/A 01/10/2023    Procedure: RIGHT/LEFT HEART CATH AND CORONARY ANGIOGRAPHY;  Surgeon: Troy Sine, MD;  Location: [place] [facility];  Service: Cardiovascular;  Laterality: N/A;   ROTATOR CUFF REPAIR        left shoulder   TEMPORARY PACEMAKER N/A 01/19/2023    Procedure: TEMPORARY PACEMAKER;  Surgeon: Jolaine Artist, MD;  Location: [place] [facility];  Service: Cardiovascular;  Laterality: N/A;   TRANSTHORACIC ECHOCARDIOGRAM   06/2017    EF 25 and 30%. GR 1 DD. Mild diastolic dysfunction with elevated LVEDP. Mild valvular disease.   TRANSTHORACIC ECHOCARDIOGRAM   11'18, 6/'19     a) EF remains 30-35%.  Diffuse hypokinesis noted.  Severe LA dilation; b) Improved EF 35-40%.  Diffuse HK.  GR 1 DD.  Moderate TR.  Mild RV dilation.   TRANSTHORACIC ECHOCARDIOGRAM   01/28/2022    Follow-up echo: EF 50 to 55%.  No RWMA.  GRII DD.  Severe LA dilation.  Mild RA dilation with normal PAP and RAP.  Mild to moderate TR (no significant change noted)   Zio patch   08/2022    3-day: Currently NSR (HR range 47-91 with average 64 bpm) 1 F AVB with IVCD/BBB.  Rare isolated PACs (<1%).  Frequent PVCs (6.1% with rare couplets and triplets.  Also bigeminy.  2 atrial runs 8 beats 5 2 seconds.  No sustained arrhythmias.   ZIO Patch 14 d Event Monitor   10/2018    Results as below < 1% PVC.  (Notably improved after starting amiodarone)         Family History  Problem Relation Age of Onset   Heart disease Mother          s/p pacemaker   Diabetes Mother     Heart disease Father     Diabetes Father  Heart disease Brother          MI   Cancer Brother          Lung   Hypertension Other     Colon cancer Neg Hx     Breast cancer Neg Hx      Social History:  reports that  she quit smoking about 29 years ago. Her smoking use included cigarettes. She has never used smokeless tobacco. She reports that she does not drink alcohol and does not use drugs. Allergies:       Allergies  Allergen Reactions   Ezetimibe-Simvastatin Other (See Comments)      REACTION: Muscle aches (side effect)   Lipitor [Atorvastatin] Other (See Comments)      Leg weakness    Claritin [Loratadine]        Possible cause of nightmares.     Spironolactone        Elevated potassium/creatinine   Tamiflu [Oseltamivir Phosphate] Other (See Comments)      nightmares   Pravastatin Sodium Other (See Comments)      REACTION: Muscle aches (side effect)   Sulfonamide Derivatives Nausea And Vomiting          Medications Prior to Admission  Medication Sig Dispense Refill   acetaminophen (TYLENOL) 500 MG tablet Take 500 mg by mouth every 6 (six) hours as needed for mild pain.       amiodarone (PACERONE) 200 MG tablet TAKE ONE-HALF TABLET BY MOUTH EVERY TUES/THURS/SAT (Patient taking differently: Take 100 mg by mouth See admin instructions. TAKE ONE-HALF TABLET BY MOUTH EVERY TUES/THURS/SAT) 90 tablet 3   aspirin 81 MG tablet Take 1 tablet (81 mg total) by mouth daily.       carvedilol (COREG) 25 MG tablet TAKE 1 TABLET BY MOUTH TWICE DAILY (Patient taking differently: Take 25 mg by mouth 2 (two) times daily with a meal.) 180 tablet 1   Cholecalciferol (VITAMIN D) 1000 UNITS capsule Take 1,000 Units by mouth daily.       doxycycline (VIBRA-TABS) 100 MG tablet Take 1 tablet (100 mg total) by mouth 2 (two) times daily. 20 tablet 0   Fluticasone-Umeclidin-Vilant (TRELEGY ELLIPTA) 100-62.5-25 MCG/ACT AEPB Inhale 1 puff into the lungs daily. (Patient not taking: Reported on 12/02/2022) 2 each 0   furosemide (LASIX) 20 MG tablet Take 1 tablet (20 mg total) by mouth daily. 90 tablet 3   Ipratropium-Albuterol (COMBIVENT RESPIMAT) 20-100 MCG/ACT AERS respimat Inhale 1 puff into the lungs every 6 (six) hours as  needed for wheezing or shortness of breath. 4 g 0   JARDIANCE 10 MG TABS tablet TAKE 1/2 TABLET(5 MG) BY MOUTH DAILY (Patient taking differently: Take 12.5 mg by mouth daily.) 15 tablet 6   metFORMIN (GLUCOPHAGE) 500 MG tablet TAKE 1/2 TABLET BY MOUTH DAILY WITH A MEAL (Patient taking differently: Take 250 mg by mouth daily.) 90 tablet 0   rosuvastatin (CRESTOR) 10 MG tablet TAKE 1 TABLET(10 MG) BY MOUTH DAILY (Patient taking differently: Take 10 mg by mouth daily.) 90 tablet 3   sacubitril-valsartan (ENTRESTO) 97-103 MG Take 1 tablet by mouth 2 (two) times daily. 180 tablet 3   traMADol (ULTRAM) 50 MG tablet TAKE 1 TABLET(50 MG) BY MOUTH THREE TIMES DAILY AS NEEDED (Patient taking differently: Take 50 mg by mouth 3 (three) times daily between meals as needed for moderate pain.) 270 tablet 1      Home: Home Living Family/patient expects to be discharged to:: Private residence Living Arrangements: Spouse/significant other Available  Help at Discharge: Family Type of Home: House Home Access: Stairs to enter Technical brewer of Steps: 3 Entrance Stairs-Rails:  (+ rail) Home Layout: Two level, Bed/bath upstairs Alternate Level Stairs-Number of Steps: 14 Bathroom Shower/Tub: Chiropodist: Standard Home Equipment: Cane - single point  Functional History: Prior Function Prior Level of Function : Independent/Modified Independent, Driving Mobility Comments: no AD in home, [facility], uses cane outside home ADLs Comments: independent ADls, light IADLs, drives and manages meds Functional Status:  Mobility: Bed Mobility Overal bed mobility: Needs Assistance Bed Mobility: Rolling, Sidelying to Sit, Sit to Supine Rolling: Min guard Sidelying to sit: Min guard, HOB elevated Supine to sit: HOB elevated, Min guard Sit to supine: Mod assist Sit to sidelying: +2 for physical assistance, Mod assist General bed mobility comments: pt received/remained in  recliner. Transfers Overall transfer level: Needs assistance Equipment used: Rolling walker (2 wheels) Transfers: Sit to/from Stand Sit to Stand: Min guard Bed to/from chair/wheelchair/BSC transfer type:: Step pivot Step pivot transfers: Min assist, +2 safety/equipment General transfer comment: Mod cues for improved technique with stand>sit as pt tends to plop down without reaching back when fatigued, mn guard to rise but minA for lowering assist. Ambulation/Gait Ambulation/Gait assistance: Min guard Gait Distance (Feet): 100 Feet (x2 with prolonged rest break leaning back against wall) Assistive device: Rolling walker (2 wheels) Gait Pattern/deviations: Step-through pattern, Trunk flexed, Drifts right/left, Decreased stride length General Gait Details: min guard for safety and line assist, pt at times bumping her RW into objects in room, possibly visual deficit. x3 standing breaks in hallway with one rest pt leaning back against the wall. Gait velocity: decreased Gait velocity interpretation: <1.31 ft/sec, indicative of household ambulator Pre-gait activities: Side stepped up side of bed with walker with +2 min assist Stairs: Yes Stairs assistance: Min guard Stair Management: Step to pattern, Forwards, With walker Number of Stairs: 10 (single 7" step x10 reps) General stair comments: single 7" platform step in room x10 reps in a row with BUE support to simulate B railings   ADL: ADL Overall ADL's : Needs assistance/impaired Eating/Feeding: Modified independent Grooming: Wash/dry hands, Wash/dry face, Supervision/safety, Standing Grooming Details (indicate cue type and reason): standing at sink Upper Body Bathing: Minimal assistance, Sitting Lower Body Bathing: Moderate assistance, Sit to/from stand Upper Body Dressing : Moderate assistance Lower Body Dressing: Total assistance, Bed level Toilet Transfer: Min guard, Ambulation, Regular Toilet, Rolling walker (2 wheels) Toileting-  Clothing Manipulation and Hygiene: Min guard, Sit to/from stand Functional mobility during ADLs: Min guard, Rolling walker (2 wheels)   Cognition: Cognition Overall Cognitive Status: Within Functional Limits for tasks assessed Orientation Level: Oriented X4 Cognition Arousal/Alertness: Awake/alert Behavior During Therapy: WFL for tasks assessed/performed Overall Cognitive Status: Within Functional Limits for tasks assessed Area of Impairment: Memory, Following commands, Problem solving Current Attention Level: Sustained Memory: Decreased short-term memory Following Commands: Follows one step commands with increased time Safety/Judgement: Decreased awareness of safety, Decreased awareness of deficits Awareness: Emergent Problem Solving: Slow processing General Comments: HoH, increased time to process and respond to cues. Poor carryover of instruction on roles of PTA/OT/Mobility from previous sessions.   Blood pressure (!) 117/52, pulse 71, temperature 98.2 F (36.8 C), temperature source Oral, resp. rate 18, height '5\' 2"'$  (1.575 m), weight 67 kg, SpO2 96 %. Physical Exam Constitutional:      General: She is not in acute distress. HENT:     Head: Normocephalic.     Nose: Nose normal.  Mouth/Throat:     Mouth: Mucous membranes are moist.  Eyes:     Pupils: Pupils are equal, round, and reactive to light.  Cardiovascular:     Rate and Rhythm: Normal rate.  Pulmonary:     Effort: Pulmonary effort is normal.  Abdominal:     Palpations: Abdomen is soft.  Musculoskeletal:     Cervical back: Normal range of motion.  Skin:    General: Skin is warm.     Comments: Vac in right inguinal area  Neurological:     Mental Status: She is alert.     Comments: Alert and oriented x 3. Normal insight and awareness. Intact Memory. Normal language and speech. Cranial nerve exam unremarkable. HOH. UE motor grossly 5/5 bilaterally. RLE 3/5 prox to 4/5 distally. LLE 4/5 to 4+/5 prox to distal.  Sensory exam normal for light touch and pain in all 4 limbs. No limb ataxia or cerebellar signs. No abnormal tone appreciated.    Psychiatric:        Mood and Affect: Mood normal.        Behavior: Behavior normal.        Lab Results Last 24 Hours       Results for orders placed or performed during the hospital encounter of 01/10/23 (from the past 24 hour(s))  Glucose, capillary     Status: Abnormal    Collection Time: 02/13/23  3:55 PM  Result Value Ref Range    Glucose-Capillary 126 (H) 70 - 99 mg/dL    Comment 1 Notify RN    Glucose, capillary     Status: Abnormal    Collection Time: 02/13/23  8:59 PM  Result Value Ref Range    Glucose-Capillary 137 (H) 70 - 99 mg/dL    Comment 1 Notify RN      Comment 2 Document in Chart    Magnesium     Status: None    Collection Time: 02/14/23  5:00 AM  Result Value Ref Range    Magnesium 2.1 1.7 - 2.4 mg/dL  Basic metabolic panel     Status: Abnormal    Collection Time: 02/14/23  5:00 AM  Result Value Ref Range    Sodium 141 135 - 145 mmol/L    Potassium 3.7 3.5 - 5.1 mmol/L    Chloride 105 98 - 111 mmol/L    CO2 30 22 - 32 mmol/L    Glucose, Bld 100 (H) 70 - 99 mg/dL    BUN 43 (H) 8 - 23 mg/dL    Creatinine, Ser 1.46 (H) 0.44 - 1.00 mg/dL    Calcium 9.3 8.9 - 10.3 mg/dL    GFR, Estimated 36 (L) >60 mL/min    Anion gap 6 5 - 15  CBC     Status: Abnormal    Collection Time: 02/14/23  5:00 AM  Result Value Ref Range    WBC 4.9 4.0 - 10.5 K/uL    RBC 3.05 (L) 3.87 - 5.11 MIL/uL    Hemoglobin 9.6 (L) 12.0 - 15.0 g/dL    HCT 31.0 (L) 36.0 - 46.0 %    MCV 101.6 (H) 80.0 - 100.0 fL    MCH 31.5 26.0 - 34.0 pg    MCHC 31.0 30.0 - 36.0 g/dL    RDW 19.7 (H) 11.5 - 15.5 %    Platelets 276 150 - 400 K/uL    nRBC 0.0 0.0 - 0.2 %  Cooxemetry Panel (carboxy, met, total hgb, O2 sat)  Status: Abnormal    Collection Time: 02/14/23  5:35 AM  Result Value Ref Range    Total hemoglobin 9.9 (L) 12.0 - 16.0 g/dL    O2 Saturation 59.9 %     Carboxyhemoglobin 2.8 (H) 0.5 - 1.5 %    Methemoglobin <0.7 0.0 - 1.5 %  Glucose, capillary     Status: Abnormal    Collection Time: 02/14/23  5:43 AM  Result Value Ref Range    Glucose-Capillary 101 (H) 70 - 99 mg/dL    Comment 1 Notify RN      Comment 2 Document in Chart    Glucose, capillary     Status: Abnormal    Collection Time: 02/14/23 11:00 AM  Result Value Ref Range    Glucose-Capillary 404 (H) 70 - 99 mg/dL  Glucose, capillary     Status: Abnormal    Collection Time: 02/14/23 12:03 PM  Result Value Ref Range    Glucose-Capillary 196 (H) 70 - 99 mg/dL      Imaging Results (Last 48 hours)  No results found.     Assessment/Plan: Diagnosis: significant debility related to MI/VT cardiac arrest, complicated by pseudoaneurysm in the right groin which required a sartorious muscle flap, multiple wash-outs and vac.  Does the need for close, 24 hr/day medical supervision in concert with the patient's rehab needs make it unreasonable for this patient to be served in a less intensive setting? Yes Co-Morbidities requiring supervision/potential complications:  -CHF, MVD -CAD -DM II -pain and wound care considerations -PAD Due to bladder management, bowel management, safety, skin/wound care, disease management, medication administration, pain management, and patient education, does the patient require 24 hr/day rehab nursing? Yes Does the patient require coordinated care of a physician, rehab nurse, therapy disciplines of PT, OT to address physical and functional deficits in the context of the above medical diagnosis(es)? Yes Addressing deficits in the following areas: balance, endurance, locomotion, strength, transferring, bowel/bladder control, bathing, dressing, feeding, grooming, toileting, and psychosocial support Can the patient actively participate in an intensive therapy program of at least 3 hrs of therapy per day at least 5 days per week? Yes The potential for patient to make  measurable gains while on inpatient rehab is excellent Anticipated functional outcomes upon discharge from inpatient rehab are modified independent and supervision  with PT, modified independent and supervision with OT, n/a with SLP. Estimated rehab length of stay to reach the above functional goals is: 8-12 days Anticipated discharge destination: Home Overall Rehab/Functional Prognosis: excellent   POST ACUTE RECOMMENDATIONS: This patient's condition is appropriate for continued rehabilitative care in the following setting: CIR Patient has agreed to participate in recommended program. Yes Note that insurance prior authorization may be required for reimbursement for recommended care.   Comment: Had long discussion with patient regarding expectations for rehab, potential LOS. She will be much more independent but will need significantly more time once home to improve exercise stamina. Also, some of that will depend upon her cardiac outcome. Additionally, while our average LOS is around 14 days, there is no guarantee that she will be with [place] that long. Rehab Admissions Coordinator to follow up      I have personally performed a face to face diagnostic evaluation of this patient. Additionally, I have examined the patient's medical record including any pertinent labs and radiographic images. If the physician assistant has documented in this note, I have reviewed and edited or otherwise concur with the physician assistant's documentation.   Thanks,   Alroy Dust  Alen Blew, MD 02/14/2023

## 2023-02-19 NOTE — Progress Notes (Signed)
Inpatient Rehab Admissions Coordinator:  There is a bed available for pt in CIR today. Dr. Cathlean Sauer aware and in agreement. Pt, pt's son and daughter, NSG, and TOC made aware.   Gayland Curry, [place], [place] Admissions Coordinator [phone]

## 2023-02-20 LAB — COMPREHENSIVE METABOLIC PANEL
ALT: 15 U/L (ref 0–44)
AST: 24 U/L (ref 15–41)
Albumin: 3 g/dL — ABNORMAL LOW (ref 3.5–5.0)
Alkaline Phosphatase: 94 U/L (ref 38–126)
Anion gap: 11 (ref 5–15)
BUN: 48 mg/dL — ABNORMAL HIGH (ref 8–23)
CO2: 28 mmol/L (ref 22–32)
Calcium: 9.8 mg/dL (ref 8.9–10.3)
Chloride: 103 mmol/L (ref 98–111)
Creatinine, Ser: 1.57 mg/dL — ABNORMAL HIGH (ref 0.44–1.00)
GFR, Estimated: 33 mL/min — ABNORMAL LOW (ref >60)
Glucose, Bld: 108 mg/dL — ABNORMAL HIGH (ref 70–99)
Potassium: 4.1 mmol/L (ref 3.5–5.1)
Sodium: 142 mmol/L (ref 135–145)
Total Bilirubin: 0.9 mg/dL (ref 0.3–1.2)
Total Protein: 6.5 g/dL (ref 6.5–8.1)

## 2023-02-20 LAB — CBC WITH DIFFERENTIAL/PLATELET
Abs Immature Granulocytes: 0.04 10*3/uL (ref 0.00–0.07)
Basophils Absolute: 0.1 10*3/uL (ref 0.0–0.1)
Basophils Relative: 1 %
Eosinophils Absolute: 0.4 10*3/uL (ref 0.0–0.5)
Eosinophils Relative: 8 %
HCT: 37.1 % (ref 36.0–46.0)
Hemoglobin: 11.4 g/dL — ABNORMAL LOW (ref 12.0–15.0)
Immature Granulocytes: 1 %
Lymphocytes Relative: 26 %
Lymphs Abs: 1.3 10*3/uL (ref 0.7–4.0)
MCH: 30.9 pg (ref 26.0–34.0)
MCHC: 30.7 g/dL (ref 30.0–36.0)
MCV: 100.5 fL — ABNORMAL HIGH (ref 80.0–100.0)
Monocytes Absolute: 0.6 10*3/uL (ref 0.1–1.0)
Monocytes Relative: 13 %
Neutro Abs: 2.6 10*3/uL (ref 1.7–7.7)
Neutrophils Relative %: 51 %
Platelets: 299 10*3/uL (ref 150–400)
RBC: 3.69 MIL/uL — ABNORMAL LOW (ref 3.87–5.11)
RDW: 18.3 % — ABNORMAL HIGH (ref 11.5–15.5)
WBC: 5.1 10*3/uL (ref 4.0–10.5)
nRBC: 0 % (ref 0.0–0.2)

## 2023-02-20 LAB — GLUCOSE, CAPILLARY
Glucose-Capillary: 101 mg/dL — ABNORMAL HIGH (ref 70–99)
Glucose-Capillary: 140 mg/dL — ABNORMAL HIGH (ref 70–99)
Glucose-Capillary: 138 mg/dL — ABNORMAL HIGH (ref 70–99)
Glucose-Capillary: 134 mg/dL — ABNORMAL HIGH (ref 70–99)

## 2023-02-20 MED ORDER — MICONAZOLE NITRATE 2 % EX CREA
TOPICAL_CREAM | Freq: Two times a day (BID) | CUTANEOUS | Status: DC
  Filled 2023-02-20: qty 28.4

## 2023-02-20 NOTE — Evaluation (Signed)
Occupational Therapy Assessment and Plan  Patient Details  Name: Natasha Woodard MRN: [id number] Date of Birth: 22-Jul-1941  OT Diagnosis: abnormal posture, acute pain, cognitive deficits, and muscle weakness (generalized) Rehab Potential: Rehab Potential (ACUTE ONLY): Good ELOS: 10-12 d   Today's Date: 02/20/2023 OT Individual Time: [id number] OT Individual Time Calculation (min): 75 min     Hospital Problem: Principal Problem:   Debility   Past Medical History:  Past Medical History:  Diagnosis Date   AICD (automatic cardioverter/defibrillator) present    Calcific tendonitis 2008   Treatment of left leg   Cardiomyopathy ([place]) 06/2017   a) Echo: EF 25-30%. GR 1-2 DD w/ elevated LVEDP. Mild valvular Dz; b) Cardiac MRI 2/'19: frequent PVCs (diffiuclt to interpret) - EF ~27% w/ diffuse HK.  No evidence of infarct, infiltrative Dz or myocarditis. -- ? if related to PVCs. c) f/u Echo 6/'19: EF 35-40%. Gr 1 DD. Diffuse HK.-> d) Jan 2020 EF 50 to 55%.  Mod MAC, mod LAE.  Ao Sclerosis; e) Echo 1/'21 - EF 60-65%, Gr II DD. Mild Mod MR.    Chronic combined systolic and diastolic CHF, NYHA class 2 and ACA/AHA stage C    Now essentially resolved, back to simply diastolic CHF   Coronary artery disease, non-occlusive    mild-moderate CAD 06/30/17 cath   CTS (carpal tunnel syndrome)    Diabetes mellitus    Type II   Dysrhythmia    patient said that she cant remember what it is   Frequent unifocal PVCs 01/2018   Event Monitor: NSR, W/ frequent multifocal PVCs (9%-down from 30% prior to amiodarone) and PACs.  Nighttime bradycardia suggestive of OSA.';  Zio patch September 2023: PVC burden now 6.1%.   Hiatal hernia    with reflux   Hyperlipidemia    Statin intolerant   Hypertension    Ichthyosis congenita    Intermittent claudication of both lower extremities due to atherosclerosis ([place]) 08/22/2021   LEA Dopplers 09/09/2021:  Right: Total occlusion noted in the superficial femoral artery.  Atherosclerosis noted throughout extremity, see note above. Three vessel runoff.  Left: Total occlusion noted in the superficial femoral artery and/or popliteal artery. Total occlusion noted in the distal anterior tibial artery. Athereosclerosis noted throughout extremity.    NSVD (normal spontaneous vaginal delivery) 1971 & 1972   Obesity    Plantar fasciitis    Left   Presence of permanent cardiac pacemaker    Pulmonary nodule    Imaged multiple times and benign appearing   Sleep apnea    Wears glasses    Past Surgical History:  Past Surgical History:  Procedure Laterality Date   ABDOMINAL HYSTERECTOMY  1975   TAH-BSO   ABSCESS DRAINAGE  1972   right breast   APPLICATION OF WOUND VAC Right 01/15/2023   Procedure: APPLICATION OF INCISIONAL WOUND VAC;  Surgeon: Broadus John, MD;  Location: [facility] OR;  Service: Vascular;  Laterality: Right;   APPLICATION OF WOUND VAC Right 01/26/2023   Procedure: APPLICATION OF WOUND VAC RIGHT GROIN WASHOUT AND SARTORIUS MUSCLE FLAP;  Surgeon: Broadus John, MD;  Location: [place];  Service: Vascular;  Laterality: Right;   ARTERY REPAIR Right 01/15/2023   Procedure: RIGHT PROFUNDA ARTERY REPAIR WITH 2000G MYRIAD MORCELLS;  Surgeon: Broadus John, MD;  Location: [place];  Service: Vascular;  Laterality: Right;   BREAST BIOPSY Left 01/14/2009   Stereo- Benign   CARDIAC MRI  01/2018   Difficult to interpret 2/2 PVCs. Normal  LV size - EF ~27% with diffuse HK. Mild RV dilation - normal fxn. -- NO MYOCARDIAL LGI => no definitive evidence of prior MI, Infiltrative Dz or Myocarditis -- suspect NICM, possibly related to PVCs.   CARPAL TUNNEL RELEASE Right 07/02/2014   Procedure: RIGHT CARPAL TUNNEL RELEASE;  Surgeon: Wynonia Sours, MD;  Location: [place];  Service: Orthopedics;  Laterality: Right;   CARPAL TUNNEL RELEASE Left 06/11/2015   Procedure: LEFT CARPAL TUNNEL RELEASE;  Surgeon: Daryll Brod, MD;  Location: [place];   Service: Orthopedics;  Laterality: Left;  REGIONAL/FAB   COLONOSCOPY     corn removal  1968   right   CORONARY STENT INTERVENTION N/A 01/10/2023   Procedure: CORONARY STENT INTERVENTION;  Surgeon: Troy Sine, MD;  Location: [place] [facility];  Service: Cardiovascular;  Laterality: N/A;   GROIN DEBRIDEMENT Right 02/06/2023   Procedure: RIGHT GROIN DEBRIDEMENT WITH WOUND VAC CHANGE;  Surgeon: Broadus John, MD;  Location: [place];  Service: Vascular;  Laterality: Right;   HEMATOMA EVACUATION Right 01/15/2023   Procedure: EVACUATION HEMATOMA RIGHT GROIN;  Surgeon: Broadus John, MD;  Location: [facility] OR;  Service: Vascular;  Laterality: Right;   Holter Monitor  06/2017   ~17,000 PVC beats - majority were singlets, some couplets. 44 brief 3-7 beat runs of NSVT. Also noted were less frequent PACs with 11 runs (longest 15 beats)   IABP INSERTION Right 01/10/2023   Procedure: IABP Insertion;  Surgeon: Troy Sine, MD;  Location: [place] [facility];  Service: Cardiovascular;  Laterality: Right;   ICD IMPLANT N/A 01/23/2023   Procedure: ICD IMPLANT;  Surgeon: Evans Lance, MD;  Location: [place] [facility];  Service: Cardiovascular;  Laterality: N/A;   INCISION AND DRAINAGE OF WOUND Right 01/31/2023   Procedure: IRRIGATION AND DEBRIDEMENT RIGHT GROIN WOUND AND WOUND VAC CHANGE;  Surgeon: Broadus John, MD;  Location: [place];  Service: Vascular;  Laterality: Right;   OPEN REDUCTION INTERNAL FIXATION (ORIF) DISTAL RADIAL FRACTURE Left 07/21/2017   Procedure: OPEN REDUCTION INTERNAL FIXATION (ORIF) LEFT DISTAL RADIAL FRACTURE;  Surgeon: Renette Butters, MD;  Location: [place];  Service: Orthopedics;  Laterality: Left;   RIGHT/LEFT HEART CATH AND CORONARY ANGIOGRAPHY N/A 06/30/2017   Procedure: Right/Left Heart Cath and Coronary Angiography;  Surgeon: Leonie Man, MD;  Location: [facility] INVASIVE CV LAB:  pRCA 55%, pCx 40%, OM1 45%, mCx 50%, D2 50% - LVEF 25-35%. Moderately elevated LVEDP(26 mmHg with  PCWP 16 mmHg).  FICK CO/CI: 4.47/2.48. PA pressures 47/14 mmHg with a mean of 27 mm.   RIGHT/LEFT HEART CATH AND CORONARY ANGIOGRAPHY N/A 01/10/2023   Procedure: RIGHT/LEFT HEART CATH AND CORONARY ANGIOGRAPHY;  Surgeon: Troy Sine, MD;  Location: [place] [facility];  Service: Cardiovascular;  Laterality: N/A;   ROTATOR CUFF REPAIR     left shoulder   TEMPORARY PACEMAKER N/A 01/19/2023   Procedure: TEMPORARY PACEMAKER;  Surgeon: Jolaine Artist, MD;  Location: [place] [facility];  Service: Cardiovascular;  Laterality: N/A;   TRANSTHORACIC ECHOCARDIOGRAM  06/2017   EF 25 and 30%. GR 1 DD. Mild diastolic dysfunction with elevated LVEDP. Mild valvular disease.   TRANSTHORACIC ECHOCARDIOGRAM  11'18, 6/'19    a) EF remains 30-35%.  Diffuse hypokinesis noted.  Severe LA dilation; b) Improved EF 35-40%.  Diffuse HK.  GR 1 DD.  Moderate TR.  Mild RV dilation.   TRANSTHORACIC ECHOCARDIOGRAM  01/28/2022   Follow-up echo: EF  50 to 55%.  No RWMA.  GRII DD.  Severe LA dilation.  Mild RA dilation with normal PAP and RAP.  Mild to moderate TR (no significant change noted)   Zio patch  08/2022   3-day: Currently NSR (HR range 47-91 with average 64 bpm) 1 F AVB with IVCD/BBB.  Rare isolated PACs (<1%).  Frequent PVCs (6.1% with rare couplets and triplets.  Also bigeminy.  2 atrial runs 8 beats 5 2 seconds.  No sustained arrhythmias.   ZIO Patch 14 d Event Monitor  10/2018   Results as below < 1% PVC.  (Notably improved after starting amiodarone)    Assessment & Plan Clinical Impression: SOBIA NORLING is a 82 y.o. female who presented on January 10, 2023 with shortness of breath and chest pain. She was admitted with an acute lateral wall MI and was also found to have significant akinesis/hypokinesis with severe mitral regurgitation.  An IABP was placed in the Cath Lab.  Unfortunately, overnight she developed a hematoma to the IABP access site with associated limb ischemia and the IABP was removed.   Patient was seen by vascular surgery for assessment of a right femoral pseudoaneurysm and a thrombin injection was performed.  On 01/15/2023 patient developed swelling in her right groin due to a significant hematoma which rapidly extended down the leg.  Patient underwent emergent right groin exploration and hematoma evacuation with right profunda femoris repair by vascular surgery.  On 01/19/2023 patient went into VT arrest in 01/20/2019 for a transvenous pacemaker placed with an ICD placed on 2/12.  On 01/24/2023 patient had her staples removed and a wound VAC replaced in the right groin area due to incisional breakdown and underlying tissue necrosis.  She went to the OR on 2/15 for washout, debridement, sartorious muscle flap and VAC replacement by Dr. Virl Cagey.  She has been followed essentially weekly by vascular surgery for VAC changes as well as wound washouts in the OR, the most recent of which was on February 06, 2023.  VAC changes have been converted to bedside and now wound care ostomy nurse is managing the VAC changes which are Monday and Thursdays. Wound cultures showed polymicrobial growth with moderate Morganella, Enterococcus and Pseudomonas. Pt has completed a course of IV Zosyn.  Patient has been up with PT and OT addressing self-care and mobility.  Most recently she has been min-guard assist for sit to stand transfers and min guard assist 30 feet x2  for gait needing cues for safety, posture.      Patient currently requires max with basic self-care skills and IADL secondary to muscle weakness, decreased cardiorespiratoy endurance, decreased coordination, decreased attention, decreased memory, and delayed processing, and decreased sitting balance, decreased standing balance, decreased postural control, and decreased balance strategies.  Prior to hospitalization, patient could complete all aspects of ADL's and IADL's incl driving with indep- [facility] .  Patient  will benefit from skilled intervention to decrease level of assist with basic self-care skills, increase independence with basic self-care skills, and increase level of independence with iADL prior to discharge home with care partner.  Anticipate patient will require 24 hour supervision and follow up home health and follow up outpatient.  OT - End of Session Activity Tolerance: Decreased this session Endurance Deficit: Yes Endurance Deficit Description: fatigues after short intervals of standing OT Assessment Rehab Potential (ACUTE ONLY): Good OT Barriers to Discharge: Inaccessible home environment;Home environment access/layout;Wound Care OT Barriers to Discharge  Comments: 2 story home with bed and bath on 2nd level with FF, steps to enter, wound vac, L UE prec OT Patient demonstrates impairments in the following area(s): Balance;Cognition;Skin Integrity;Endurance;Motor;Pain;Other (Comment) (HOH) OT Basic ADL's Functional Problem(s): Grooming;Bathing;Dressing;Toileting OT Advanced ADL's Functional Problem(s): Simple Meal Preparation;Light Housekeeping;Laundry OT Transfers Functional Problem(s): Toilet;Tub/Shower OT Plan OT Intensity: Minimum of 1-2 x/day, 45 to 90 minutes OT Frequency: 5 out of 7 days OT Duration/Estimated Length of Stay: 10-12 d OT Treatment/Interventions: Balance/vestibular training;Disease mangement/prevention;Neuromuscular re-education;Self Care/advanced ADL retraining;Therapeutic Exercise;Wheelchair propulsion/positioning;Cognitive remediation/compensation;DME/adaptive equipment instruction;Pain management;Skin care/wound managment;UE/LE Strength taining/ROM;Community reintegration;Patient/family education;UE/LE Coordination activities;Discharge planning;Functional mobility training;Psychosocial support;Therapeutic Activities OT Self Feeding Anticipated Outcome(s): indep OT Basic Self-Care Anticipated Outcome(s): mod Indep OT Toileting Anticipated Outcome(s): mod I OT  Bathroom Transfers Anticipated Outcome(s): Supervision OT Recommendation Patient destination: Home Follow Up Recommendations: Home health OT (due to wound care potential- if not outpt OT) Equipment Details: TTB, RW   OT Evaluation Precautions/Restrictions  Precautions Precautions: Fall;ICD/Pacemaker;Other (comment) Precaution Comments: wound VAC rt groin, LUE ICD placed 2/12 no lifting LUE past shoulder or L shld resistance exercise x6 weeks Restrictions Weight Bearing Restrictions: No General Chart Reviewed: Yes Family/Caregiver Present: Yes Vital Signs  Pain Pain Assessment Pain Scale: 0-10 Pain Score: 4  Pain Type: Chronic pain Pain Location: Back Pain Orientation: Lower Pain Descriptors / Indicators: Aching Pain Onset: On-going Pain Intervention(s): Pain med given for lower pain score than stated, per patient request;Repositioned;Relaxation;Rest Multiple Pain Sites: No Home Living/Prior Functioning Home Living Family/patient expects to be discharged to:: Private residence Living Arrangements: Spouse/significant other Available Help at Discharge: Family, Available 24 hours/day Type of Home: House Home Access: Stairs to enter [employer] of Steps: 2 Entrance Stairs-Rails: Right, Left Home Layout: Two level, Bed/bath upstairs Alternate Level Stairs-Number of Steps: 14 Alternate Level Stairs-Rails: Left, Right (1/2 way on R) Bathroom Shower/Tub: Optometrist: Yes  Lives With: Spouse IADL History Homemaking Responsibilities: Yes Meal Prep Responsibility: Primary Current License: Yes Mode of Transportation: Musician Occupation: Retired Leisure and Hobbies: bible, [employer] basketball, [facility] Prior Function Level of Independence: Independent with basic ADLs, Independent with transfers, Independent with homemaking with ambulation, Independent with gait  Able to Take Stairs?: Yes Driving:  Yes Vision Baseline Vision/History: 1 Wears glasses Ability to See in Adequate Light: 0 Adequate Patient Visual Report: No change from baseline;Diplopia (reports occasional diplopia with fatigue after hours of TV at home, no new changes, denies diplopia since in hospital) Vision Assessment?: No apparent visual deficits Perception  Perception: Within Functional Limits Praxis Praxis: Intact Cognition Cognition Overall Cognitive Status: Within Functional Limits for tasks assessed Orientation Level: Person;Place;Situation Memory: Impaired Memory Impairment: Decreased recall of new information Attention: Selective Awareness: Appears intact Problem Solving: Appears intact Safety/Judgment: Appears intact Comments: HOH impacts retention and attention despite hearing aides Brief Interview for Mental Status (BIMS) Repetition of Three Words (First Attempt): 3 Temporal Orientation: Year: Correct Temporal Orientation: Month: Accurate within 5 days Temporal Orientation: Day: Correct Recall: "Sock": Yes, no cue required Recall: "Blue": Yes, no cue required Recall: "Bed": Yes, after cueing ("a piece of furniture") BIMS Summary Score: 14 Sensation Sensation Light Touch: Appears Intact Hot/Cold: Appears Intact Proprioception: Appears Intact Stereognosis: Appears Intact Additional Comments: grossly intact B UE's Coordination Gross Motor Movements are Fluid and Coordinated: No Fine Motor Movements are Fluid and Coordinated: No Coordination and Movement Description: mild b distal hand tremors L>R Finger Nose Finger Test: mild dysmetria 9 Hole Peg Test: TBA Motor  Motor  Motor: Other (comment) Motor - Skilled Clinical Observations: generalized weakness  Trunk/Postural Assessment  Cervical Assessment Cervical Assessment: Within Functional Limits Thoracic Assessment Thoracic Assessment: Within Functional Limits Lumbar Assessment Lumbar Assessment: Within Functional Limits Postural  Control Postural Control: Deficits on evaluation Protective Responses: delayed Postural Limitations: posterior pelvis in sitting and limited postrual control in standing  Balance Balance Balance Assessed: Yes Static Sitting Balance Static Sitting - Level of Assistance: 5: Stand by assistance Dynamic Sitting Balance Dynamic Sitting - Level of Assistance: 5: Stand by assistance Static Standing Balance Static Standing - Level of Assistance: 4: Min assist Dynamic Standing Balance Dynamic Standing - Level of Assistance: 3: Mod assist;4: Min assist Extremity/Trunk Assessment RUE Assessment RUE Assessment: Within Functional Limits LUE Assessment General Strength Comments: unable to test L sh but otherwise grossly 3+/5  Care Tool Care Tool Self Care Eating   Eating Assist Level: Set up assist    Oral Care    Oral Care Assist Level: Minimal Assistance - Patient > 75%    Bathing   Body parts bathed by patient: Right arm;Left arm;Chest;Right upper leg;Left upper leg;Face Body parts bathed by helper: Abdomen;Front perineal area;Buttocks;Right lower leg;Left lower leg   Assist Level: Maximal Assistance - Patient 24 - 49%    Upper Body Dressing(including orthotics)   What is the patient wearing?: Pull over shirt   Assist Level: Minimal Assistance - Patient > 75%    Lower Body Dressing (excluding footwear)   What is the patient wearing?: Pants Assist for lower body dressing: Maximal Assistance - Patient 25 - 49%    Putting on/Taking off footwear   What is the patient wearing?: Non-skid slipper socks;Socks;Shoes Assist for footwear: Total Assistance - Patient < 25%       Care Tool Toileting Toileting activity   Assist for toileting: Moderate Assistance - Patient 50 - 74%     Care Tool Bed Mobility Roll left and right activity   Roll left and right assist level: Minimal Assistance - Patient > 75%    Sit to lying activity        Lying to sitting on side of bed activity    Lying to sitting on side of bed assist level: the ability to move from lying on the back to sitting on the side of the bed with no back support.: Minimal Assistance - Patient > 75%     Care Tool Transfers Sit to stand transfer   Sit to stand assist level: Minimal Assistance - Patient > 75%    Chair/bed transfer   Chair/bed transfer assist level: Minimal Assistance - Patient > 75%     Toilet transfer   Assist Level: Minimal Assistance - Patient > 75%     Care Tool Cognition  Expression of Ideas and Wants Expression of Ideas and Wants: 4. Without difficulty (complex and basic) - expresses complex messages without difficulty and with speech that is clear and easy to understand (Simultaneous filing. User may not have seen previous data.)  Understanding Verbal and Non-Verbal Content Understanding Verbal and Non-Verbal Content: 3. Usually understands - understands most conversations, but misses some part/intent of message. Requires cues at times to understand (d/t HOH  Simultaneous filing. User may not have seen previous data.)   Memory/Recall Ability Memory/Recall Ability : Current season;That he or she is in a hospital/hospital unit (Simultaneous filing. User may not have seen previous data.)   Refer to Care Plan for Long Term Goals  SHORT TERM GOAL WEEK 1 OT Short Term Goal  1 (Week 1): Pt will stand for grooming routine for up to 5 min with close S OT Short Term Goal 2 (Week 1): Pt will complete LB sponge bathing and dressing with CGA with AE sink side OT Short Term Goal 3 (Week 1): Pt will transfer on/off toilet with CGA with LRAD  Recommendations for other services: None    Skilled Therapeutic Intervention ADL ADL Eating: Set up Where Assessed-Eating: Wheelchair Grooming: Minimal assistance Where Assessed-Grooming: Sitting at sink Upper Body Bathing: Moderate assistance Where Assessed-Upper Body Bathing: Sitting at sink Lower Body Bathing: Maximal assistance Where  Assessed-Lower Body Bathing: Sitting at sink;Standing at sink Upper Body Dressing: Minimal assistance Where Assessed-Upper Body Dressing: Sitting at sink Lower Body Dressing: Maximal assistance Where Assessed-Lower Body Dressing: Standing at sink;Sitting at sink Toileting: Moderate assistance Where Assessed-Toileting: Glass blower/designer: Psychiatric nurse Method: Stand pivot Tub/Shower Transfer: Moderate assistance Tub/Shower Transfer Method: Stand pivot Tub/Shower Equipment: Facilities manager: Environmental education officer Method: Brewing technologist ADL Comments: max a LB bathing and dressing sink side with wound vac mngt, min a UB sponge bathing, dressing pull over shirt, close s grooming, SPT RW min A Mobility  Bed Mobility Bed Mobility: Rolling Right;Rolling Left;Left Sidelying to Sit;Supine to Sit Rolling Right: Contact Guard/Touching assist Rolling Left: Contact Guard/Touching assist Left Sidelying to Sit: Minimal Assistance - Patient >75% Supine to Sit: Minimal Assistance - Patient > 75% Transfers Sit to Stand: Minimal Assistance - Patient > 75% Stand to Sit: Minimal Assistance - Patient > 75%  OT Intervention/Treatment: Pt seen for full initial OT evaluation and training session this am. Pt in bed upon OT arrival. OT introduced role of therapy and purpose of session. OT conducted comprehensive OT evaluation and treatment session with status outlined in eval. Son arrived and OT care coordination completed for initiating OT treatment and discussed precautions and wound vac safety. After son stepped out, pt worked with OT for full am self care routine, toileting, transfer assessment and retraining with R wound vac line mngt. Pt with higher level cognitive deficits which may be related to severe HOH despite hearing aides. Pt will benefit from CIR to maximize functional level and safety  prior to discharge home with family support. If pt does not need home health for wound care, outpt OT rec. If so HHOT with TTB and RW rec. Pt left at end of session up in w/c with chair alarm set, tray table and nurse call bell within reach.    Discharge Criteria: Patient will be discharged from OT if patient refuses treatment 3 consecutive times without medical reason, if treatment goals not met, if there is a change in medical status, if patient makes no progress towards goals or if patient is discharged from hospital.  The above assessment, treatment plan, treatment alternatives and goals were discussed and mutually agreed upon: by patient and by family  Barnabas Lister 02/20/2023, 12:52 PM

## 2023-02-20 NOTE — Progress Notes (Signed)
Inpatient Rehabilitation  Patient information reviewed and entered into eRehab system by Narmeen Kerper Danyael Alipio, OTR/L, Rehab Quality Coordinator.   Information including medical coding, functional ability and quality indicators will be reviewed and updated through discharge.   

## 2023-02-20 NOTE — Consult Note (Addendum)
[place] Nurse wound follow up Wound type: surgical  Measurement: 8 cms x 3 cms x 1.4 cms  Wound bed: 95% red beefy, 5% fibrinous  Drainage (amount, consistency, odor)  minimal serosanguinous  Periwound: mild denuded area at 6 o'clock, covered with barrier ring Dressing procedure/placement/frequency: Vascular had removed the old NPWT dressing and placed a NS moist to dry dressing, removed this dressing, cleansed wound with normal saline Periwound skin protected with skin barrier wipe    Filled wound with  _1__ piece of black foam,  Did utilize 2" barrier ring that I cut in half, flattened and placed in creases to left and right of wound  Immediate seal NPWT dressing at 163m HG.   Patient did not desire pain medication prior to dressing change, tolerated procedure well  WOwentonnurse will continue to provide NPWT dressing changed due to the complexity of the dressing change Mondays and Thursdays.  Next change Thursday 02/23/2023.    Thank you,    Rheanne Cortopassi MSN, RN-BC, [employer]

## 2023-02-20 NOTE — Progress Notes (Signed)
Physical Therapy Session Note  Patient Details  Name: Natasha Woodard MRN: [id number] Date of Birth: Apr 29, 1941  Today's Date: 02/20/2023 PT Individual Time: 1300-1415 PT Individual Time Calculation (min): 75 min   Short Term Goals: Week 1:  PT Short Term Goal 1 (Week 1): = LTGs due to ELOS  Skilled Therapeutic Interventions/Progress Updates:  Chart reviewed and pt agreeable to therapy. Pt received seated in recliner with c/o pain in groin that was not quantified. Session focused on assessment of balance and bed mobility to establish baseline for therapy and ambulation endurance to promote safe home access. Pt initiated session with BERG, for which pt scored 31/56, indicating increased fall risk. Pt then completed bed mobility with supervision to manage wound vac. Pt then transferred to therapy gym for time management. In therapy gym, pt completed amb of 278f + 1025fusing CGA + RW. Pt noted to progress sit to stand to supervision + RW with VC and safety education. Pt then stood for standing balance exercise, but pt noted to urinate small amount, which pt attributed to yeast infection. Pt then [facility] + RW for toilet transfer and for return to EOB after. RN notified that pt requesting medication for sore, so PT completed direct hand off to RN with pt sitting EOB.     Therapy Documentation Precautions:  Precautions Precautions: Fall, ICD/Pacemaker, Other (comment) Precaution Comments: wound VAC rt groin, LUE ICD placed 2/12 no lifting LUE past shoulder or L shld resistance exercise x6 weeks Restrictions Weight Bearing Restrictions: No     Therapy/Group: Individual Therapy  KiMarquette OldPT, DPT 02/20/2023, 2:32 PM

## 2023-02-20 NOTE — Discharge Summary (Signed)
Physician Discharge Summary  Patient ID: Natasha Woodard MRN: [id number] DOB/AGE: Nov 10, 1941 82 y.o.  Admit date: 02/19/2023 Discharge date: 02/28/2023  Discharge Diagnoses:  Principal Problem:   Debility Acute lateral wall MI with cardiogenic shock Right femoral artery pseudoaneurysm Right groin wound AKI on CKD IIIb Vaginal candidiasis Cerumen impaction Acute on chronic anemia Acute on chronic CHF Intertriginous dermatitis Insomnia Sacral MASD  Discharged Condition: stable  Labs:  Basic Metabolic Panel: Recent Labs  Lab 02/24/23 1237 02/27/23 0553  NA 138 142  K 4.2 4.3  CL 102 100  CO2 24 28  GLUCOSE 107* 106*  BUN 57* 51*  CREATININE 1.54* 1.32*  CALCIUM 9.5 9.8    CBC: Recent Labs  Lab 02/24/23 1237 02/27/23 0553  WBC 6.5 4.6  NEUTROABS 4.1 2.2  HGB 12.4 12.1  HCT 40.3 40.0  MCV 101.8* 100.0  PLT 268 253    CBG: Recent Labs  Lab 02/27/23 1129 02/27/23 1657 02/27/23 2054 02/28/23 0601 02/28/23 1129  GLUCAP 180* 97 180* 100* 130*    Brief HPI:   Natasha Woodard is a 82 y.o. female who presented on January 10, 2023 with shortness of breath and chest pain. She was admitted with an acute lateral wall MI and was also found to have significant akinesis/hypokinesis with severe mitral regurgitation.  An IABP was placed in the Cath Lab.  Unfortunately, overnight she developed a hematoma to the IABP access site with associated limb ischemia and the IABP was removed.  Patient was seen by vascular surgery for assessment of a right femoral pseudoaneurysm and a thrombin injection was performed.  On 01/15/2023 patient developed swelling in her right groin due to a significant hematoma which rapidly extended down the leg.  Patient underwent emergent right groin exploration and hematoma evacuation with right profunda femoris repair by vascular surgery.  On 01/19/2023 patient went into VT arrest in 01/20/2019 for a transvenous pacemaker placed with an ICD placed on 2/12.   On 01/24/2023 patient had her staples removed and a wound VAC replaced in the right groin area due to incisional breakdown and underlying tissue necrosis.  She went to the OR on 2/15 for washout, debridement, sartorious muscle flap and VAC replacement by Dr. Virl Cagey.  She has been followed essentially weekly by vascular surgery for VAC changes as well as wound washouts in the OR, the most recent of which was on February 06, 2023.  VAC changes have been converted to bedside and now wound care ostomy nurse is managing the VAC changes which are Monday and Thursdays. Wound cultures showed polymicrobial growth with moderate Morganella, Enterococcus and Pseudomonas. Pt has completed a course of IV Zosyn.    Hospital Course: Natasha Woodard was admitted to rehab 02/19/2023 for inpatient therapies to consist of PT, ST and OT at least three hours five days a week. Past admission physiatrist, therapy team and rehab RN have worked together to provide customized collaborative inpatient rehab.  Wound VAC changes continued bi-weekly. Vascular surgery continued to follow. Treatment for vaginal candidiasis completed. Attention to skin care/MASD. Improved. Creatinine down to 1.32. Right vaginal boil noted on 3/19. No signs or symptoms of worsening infection. Advised patient that these generally self resolve, can manage at home with intermittent warm compress until it drains on its own. If it does not resolve or get worse, can follow-up with PCP for in office I&D.    Blood pressures were monitored on TID basis and remained stable. Heart meds including amiodarone, Demadex, aldactone and  Cozaar continued. SSI used for tighter control. OK to resume metformin and Jardiance at discharge.  Rehab course: During patient's stay in rehab weekly team conferences were held to monitor patient's progress, set goals and discuss barriers to discharge. At admission, patient required min with mobility and  max with basic self-care skills and  IADL.   She  has had improvement in activity tolerance, balance, postural control as well as ability to compensate for deficits. She has had improvement in functional use RUE/LUE  and RLE/LLE as well as improvement in awareness  Discharge disposition: 01-Home or Self Care      Diet: heart healthy/carb modified  Special Instructions: No driving, alcohol consumption or tobacco use.  30-35 minutes were spent on discharge planning and discharge summary.  Discharge Instructions     Discharge patient   Complete by: As directed    Discharge disposition: 01-Home or Self Care   Discharge patient date: 02/28/2023      Allergies as of 02/28/2023       Reactions   Ezetimibe-simvastatin Other (See Comments)   REACTION: Muscle aches (side effect)   Lipitor [atorvastatin] Other (See Comments)   Leg weakness    Claritin [loratadine]    Possible cause of nightmares.     Spironolactone    Elevated potassium/creatinine   Tamiflu [oseltamivir Phosphate] Other (See Comments)   nightmares   Pravastatin Sodium Other (See Comments)   REACTION: Muscle aches (side effect)   Sulfonamide Derivatives Nausea And Vomiting        Medication List     STOP taking these medications    aspirin EC 81 MG tablet   carvedilol 25 MG tablet Commonly known as: COREG   clotrimazole 1 % vaginal cream Commonly known as: GYNE-LOTRIMIN   Entresto 97-103 MG Generic drug: sacubitril-valsartan   feeding supplement Liqd   furosemide 20 MG tablet Commonly known as: LASIX   nutrition supplement (JUVEN) Pack   Vitamin D 1000 units capsule       TAKE these medications    acetaminophen 325 MG tablet Commonly known as: Tylenol Take 1-2 tablets (325-650 mg total) by mouth every 6 (six) hours as needed. What changed:  medication strength how much to take reasons to take this   amiodarone 200 MG tablet Commonly known as: PACERONE Take 1 tablet (200 mg total) by mouth daily.   apixaban 2.5 MG  Tabs tablet Commonly known as: ELIQUIS Take 1 tablet (2.5 mg total) by mouth 2 (two) times daily.   clopidogrel 75 MG tablet Commonly known as: PLAVIX Take 1 tablet (75 mg total) by mouth daily.   Combivent Respimat 20-100 MCG/ACT Aers respimat Generic drug: Ipratropium-Albuterol Inhale 1 puff into the lungs every 6 (six) hours as needed for wheezing or shortness of breath.   empagliflozin 10 MG Tabs tablet Commonly known as: JARDIANCE Take 1 tablet (10 mg total) by mouth daily. Discontinued while inpatient, F/u restart once infections resolve/outpatient What changed: how much to take   losartan 25 MG tablet Commonly known as: COZAAR Take 0.5 tablets (12.5 mg total) by mouth daily.   metFORMIN 500 MG tablet Commonly known as: GLUCOPHAGE Take 0.5 tablets (250 mg total) by mouth daily with breakfast.   multivitamin with minerals Tabs tablet Take 1 tablet by mouth daily. Start taking on: March 01, 2023   rosuvastatin 10 MG tablet Commonly known as: CRESTOR Take 1 tablet (10 mg total) by mouth daily. TAKE 1 TABLET(10 MG) BY MOUTH DAILY Strength: 10 mg What changed: See  the new instructions.   spironolactone 25 MG tablet Commonly known as: ALDACTONE Take 0.5 tablets (12.5 mg total) by mouth daily.   torsemide 20 MG tablet Commonly known as: DEMADEX Take 1 tablet (20 mg total) by mouth daily.   traMADol 50 MG tablet Commonly known as: ULTRAM Take 1 tablet (50 mg total) by mouth 2 (two) times daily. What changed: when to take this        Follow-up Information     Tonia Ghent, MD Follow up.   Specialty: Family Medicine Why: Call the office in 1-2 days for hospital follow-up appointment. Contact information: [place] [address] [phone]         Durel Salts C, DO Follow up.   Specialty: Physical Medicine and Rehabilitation Why: office will call you to arrange your appt (sent) Contact information: [address] [place] [address] [phone]         Broadus John, MD. Go to.   Specialty: Vascular Surgery Contact information: [place] [address] [phone]         Leonie Man, MD. Go to.   Specialty: Cardiology Contact information: [address] [place] [address] [phone]                 Signed: Barbie Banner 02/28/2023, 4:39 PM

## 2023-02-20 NOTE — Progress Notes (Signed)
PROGRESS NOTE   Subjective/Complaints: Slept poorly last night after lot of visitors saw Natasha Woodard, they left by 8pm but Natasha Woodard was unable to have Natasha Woodard regular nap yesterday +pain at wound vac site  ROS: +pain at wound vac site   Objective:   No results found. Recent Labs    02/20/23 0717  WBC 5.1  HGB 11.4*  HCT 37.1  PLT 299   Recent Labs    02/20/23 0717  NA 142  K 4.1  CL 103  CO2 28  GLUCOSE 108*  BUN 48*  CREATININE 1.57*  CALCIUM 9.8    Intake/Output Summary (Last 24 hours) at 02/20/2023 1304 Last data filed at 02/20/2023 0902 Gross per 24 hour  Intake 598 ml  Output 110 ml  Net 488 ml        Physical Exam: Vital Signs Blood pressure (!) 115/56, pulse 70, temperature 97.8 F (36.6 C), temperature source Oral, resp. rate 16, SpO2 98 %. Gen: no distress, normal appearing HENT:     Head: Normocephalic and atraumatic.     Ears:     Comments: A little HOH    Nose: Nose normal.     Mouth/Throat:     Mouth: Mucous membranes are moist.  Eyes:     Extraocular Movements: Extraocular movements intact.     Pupils: Pupils are equal, round, and reactive to light.  Cardiovascular:     Rate and Rhythm: Normal rate. Rhythm irregular.     Heart sounds: Murmur heard.  Pulmonary:     Effort: Pulmonary effort is normal. No respiratory distress.     Breath sounds: Normal breath sounds. No stridor. No wheezing.  Abdominal:     General: Bowel sounds are normal. There is no distension.     Palpations: Abdomen is soft.     Tenderness: There is no abdominal tenderness.  Musculoskeletal:        General: Normal range of motion.     Cervical back: Normal range of motion and neck supple.     Right lower leg: No edema.     Left lower leg: No edema.  Skin:    Comments: Skin moist/tacky along abdominal/inguinal folds. No frank erythema appreciated. Vac in left groin sealed.  Would with minimal serosanginous drainage during  dressing change Neurological:     Mental Status: Natasha Woodard is alert.     Comments: Alert and oriented x 3. Normal insight and awareness. Intact Memory. Normal language and speech. Cranial nerve exam unremarkable. HOH, UE 5/5. LE 3+ prox to 4/5 distally. Sensory exam normal for light touch and pain in all 4 limbs. No limb ataxia or cerebellar signs. No abnormal tone appreciated.    Psychiatric:        Mood and Affect: Mood normal.        Behavior: Behavior normal.    Assessment/Plan: 1. Functional deficits which require 3+ hours per day of interdisciplinary therapy in a comprehensive inpatient rehab setting. Physiatrist is providing close team supervision and 24 hour management of active medical problems listed below. Physiatrist and rehab team continue to assess barriers to discharge/monitor patient progress toward functional and medical goals  Care Tool:  Bathing  Body parts bathed by patient: Right arm, Left arm, Chest, Right upper leg, Left upper leg, Face   Body parts bathed by helper: Abdomen, Front perineal area, Buttocks, Right lower leg, Left lower leg     Bathing assist Assist Level: Maximal Assistance - Patient 24 - 49%     Upper Body Dressing/Undressing Upper body dressing   What is the patient wearing?: Pull over shirt    Upper body assist Assist Level: Minimal Assistance - Patient > 75%    Lower Body Dressing/Undressing Lower body dressing      What is the patient wearing?: Pants     Lower body assist Assist for lower body dressing: Maximal Assistance - Patient 25 - 49%     Toileting Toileting    Toileting assist Assist for toileting: Moderate Assistance - Patient 50 - 74%     Transfers Chair/bed transfer  Transfers assist     Chair/bed transfer assist level: Minimal Assistance - Patient > 75%     Locomotion Ambulation   Ambulation assist      Assist level: Minimal Assistance - Patient > 75% Assistive device: Hand held assist Max distance:  130'   Walk 10 feet activity   Assist     Assist level: Minimal Assistance - Patient > 75% Assistive device: Hand held assist   Walk 50 feet activity   Assist    Assist level: Minimal Assistance - Patient > 75% Assistive device: Hand held assist    Walk 150 feet activity   Assist Walk 150 feet activity did not occur: Safety/medical concerns         Walk 10 feet on uneven surface  activity   Assist     Assist level: Moderate Assistance - Patient - 50 - 74% Assistive device: Hand held assist   Wheelchair     Assist Is the patient using a wheelchair?: Yes Type of Wheelchair: Manual    Wheelchair assist level: Dependent - Patient 0% (will be primary ambulator)      Wheelchair 50 feet with 2 turns activity    Assist        Assist Level: Dependent - Patient 0%   Wheelchair 150 feet activity     Assist      Assist Level: Dependent - Patient 0%   Blood pressure (!) 115/56, pulse 70, temperature 97.8 F (36.6 C), temperature source Oral, resp. rate 16, SpO2 98 %.    Medical Problem List and Plan: 1. Functional deficits secondary to debility after multiple medical issues after acute lateral wall MI and cardiogenic shock.              -course complicated by right femoral pseudoaneurysm which ultimately required I&D, washout as well as sartorius muscle flap and vac placement             -patient may not yet shower             -ELOS/Goals: 7-10 days, mod I to supervision goals  Initial CIR evals today 2.  Antithrombotics: -DVT/anticoagulation:  Pharmaceutical: Eliquis             -antiplatelet therapy: plavix 3. Pain Management: scheduled tylenol and tramadol ordered. This is working for Natasha Woodard.             -lidocaine patches prn 4. Mood/Behavior/Sleep: monitor sleep patterns             -melatonin PRN for sleep             -antipsychotic agents:  n/a 5. Neuropsych/cognition: This patient is capable of making decisions on Natasha Woodard own behalf. 6.  Skin/Wound Care: vac changes right groin Mondays and Thursday             -Add InterDry dressing to abdominal/inguinal fold to control moisture 7. Fluids/Electrolytes/Nutrition: encourage appropriate PO inatke             -check labs Monday morning             -po intake has been sporadic             -protein/nutritional supps have been ordered for moderate protein deficient malnutrition 8. Acute on chronic systolic CHF/Acute lateral MI/PAF/VT arrest 2/7             -s/p DDD ICD on 2/12             -continue amiodarone '200mg'$  daily              -HR controlled, regular             -volume mgt with demadex/aldactone             -cozaar 12.'5mg'$  daily 9. Right femoral pseudoaneurysm             -wound care as above             -s/p sartorius rmuscle flap             -complete course of IV zosyn 10. AKI on CKD IIIb             -Baseline Cr 1.5             -encouraged 6-8 glasses of water per day             -avoid nephrotoxic meds 11. Acute on chronic anemia             -s/p iron infusion             -check cbc in am             -optimize nutritional status 12. Vaginal yeast             -topical clotrimazole for 6 more doses 13. Cerumen impaction             -apparently attempts made to clear it             -outpt follow up recommended 14. Intertriginous dermatitis: monostat ordered 15. Insomnia: grounds pass ordered for sunlight exposure during the day.    >50 minutes spend in review of chart and labs, discussing insomnia, ordering grounds pass for sunlight exposure during the day, discussing labs with patient, recommended lower added sugar/higher protein diet  LOS: 1 days A FACE TO FACE EVALUATION WAS PERFORMED  Natasha Woodard 02/20/2023, 1:04 PM

## 2023-02-20 NOTE — H&P (Incomplete)
Physical Medicine and Rehabilitation Admission H&P   CC: Functional deficits secondary to   HPI: ***  ROS Past Medical History:  Diagnosis Date  . AICD (automatic cardioverter/defibrillator) present   . Calcific tendonitis 2008   Treatment of left leg  . Cardiomyopathy ([place]) 06/2017   a) Echo: EF 25-30%. GR 1-2 DD w/ elevated LVEDP. Mild valvular Dz; b) Cardiac MRI 2/'19: frequent PVCs (diffiuclt to interpret) - EF ~27% w/ diffuse HK.  No evidence of infarct, infiltrative Dz or myocarditis. -- ? if related to PVCs. c) f/u Echo 6/'19: EF 35-40%. Gr 1 DD. Diffuse HK.-> d) Jan 2020 EF 50 to 55%.  Mod MAC, mod LAE.  Ao Sclerosis; e) Echo 1/'21 - EF 60-65%, Gr II DD. Mild Mod MR.   . Chronic combined systolic and diastolic CHF, NYHA class 2 and ACA/AHA stage C    Now essentially resolved, back to simply diastolic CHF  . Coronary artery disease, non-occlusive    mild-moderate CAD 06/30/17 cath  . CTS (carpal tunnel syndrome)   . Diabetes mellitus    Type II  . Dysrhythmia    patient said that she cant remember what it is  . Frequent unifocal PVCs 01/2018   Event Monitor: NSR, W/ frequent multifocal PVCs (9%-down from 30% prior to amiodarone) and PACs.  Nighttime bradycardia suggestive of OSA.';  Zio patch September 2023: PVC burden now 6.1%.  . Hiatal hernia    with reflux  . Hyperlipidemia    Statin intolerant  . Hypertension   . Ichthyosis congenita   . Intermittent claudication of both lower extremities due to atherosclerosis ([place]) 08/22/2021   LEA Dopplers 09/09/2021:  Right: Total occlusion noted in the superficial femoral artery. Atherosclerosis noted throughout extremity, see note above. Three vessel runoff.  Left: Total occlusion noted in the superficial femoral artery and/or popliteal artery. Total occlusion noted in the distal anterior tibial artery. Athereosclerosis noted throughout extremity.   Marland Kitchen NSVD (normal spontaneous vaginal delivery) 1971 & 1972  . Obesity   . Plantar  fasciitis    Left  . Presence of permanent cardiac pacemaker   . Pulmonary nodule    Imaged multiple times and benign appearing  . Sleep apnea   . Wears glasses    Past Surgical History:  Procedure Laterality Date  . ABDOMINAL HYSTERECTOMY  1975   TAH-BSO  . ABSCESS DRAINAGE  1972   right breast  . APPLICATION OF WOUND VAC Right 01/15/2023   Procedure: APPLICATION OF INCISIONAL WOUND VAC;  Surgeon: Broadus John, MD;  Location: [facility];  Service: Vascular;  Laterality: Right;  . APPLICATION OF WOUND VAC Right 01/26/2023   Procedure: APPLICATION OF WOUND VAC RIGHT GROIN WASHOUT AND SARTORIUS MUSCLE FLAP;  Surgeon: Broadus John, MD;  Location: [facility];  Service: Vascular;  Laterality: Right;  . ARTERY REPAIR Right 01/15/2023   Procedure: RIGHT PROFUNDA ARTERY REPAIR WITH 2000G MYRIAD MORCELLS;  Surgeon: Broadus John, MD;  Location: [place] [facility];  Service: Vascular;  Laterality: Right;  . BREAST BIOPSY Left 01/14/2009   Stereo- Benign  . CARDIAC MRI  01/2018   Difficult to interpret 2/2 PVCs. Normal LV size - EF ~27% with diffuse HK. Mild RV dilation - normal fxn. -- NO MYOCARDIAL LGI => no definitive evidence of prior MI, Infiltrative Dz or Myocarditis -- suspect NICM, possibly related to PVCs.  . CARPAL TUNNEL RELEASE Right 07/02/2014   Procedure: RIGHT CARPAL TUNNEL RELEASE;  Surgeon: Wynonia Sours, MD;  Location: [facility];  Service: Orthopedics;  Laterality: Right;  . CARPAL TUNNEL RELEASE Left 06/11/2015   Procedure: LEFT CARPAL TUNNEL RELEASE;  Surgeon: Daryll Brod, MD;  Location: [facility];  Service: Orthopedics;  Laterality: Left;  REGIONAL/FAB  . COLONOSCOPY    . corn removal  1968   right  . CORONARY STENT INTERVENTION N/A 01/10/2023   Procedure: CORONARY STENT INTERVENTION;  Surgeon: Troy Sine, MD;  Location: [facility];  Service: Cardiovascular;  Laterality: N/A;  . GROIN DEBRIDEMENT Right 02/06/2023   Procedure: RIGHT GROIN DEBRIDEMENT  WITH WOUND VAC CHANGE;  Surgeon: Broadus John, MD;  Location: [facility];  Service: Vascular;  Laterality: Right;  . HEMATOMA EVACUATION Right 01/15/2023   Procedure: EVACUATION HEMATOMA RIGHT GROIN;  Surgeon: Broadus John, MD;  Location[facility];  Service: Vascular;  Laterality: Right;  . Holter Monitor  06/2017   ~17,000 PVC beats - majority were singlets, some couplets. 44 brief 3-7 beat runs of NSVT. Also noted were less frequent PACs with 11 runs (longest 15 beats)  . IABP INSERTION Right 01/10/2023   Procedure: IABP Insertion;  Surgeon: Troy Sine, MD;  Location: [place];  Service: Cardiovascular;  Laterality: Right;  . ICD IMPLANT N/A 01/23/2023   Procedure: ICD IMPLANT;  Surgeon: Evans Lance, MD;  Location: [place];  Service: Cardiovascular;  Laterality: N/A;  . INCISION AND DRAINAGE OF WOUND Right 01/31/2023   Procedure: IRRIGATION AND DEBRIDEMENT RIGHT GROIN WOUND AND WOUND VAC CHANGE;  Surgeon: Broadus John, MD;  Location: [place];  Service: Vascular;  Laterality: Right;  . OPEN REDUCTION INTERNAL FIXATION (ORIF) DISTAL RADIAL FRACTURE Left 07/21/2017   Procedure: OPEN REDUCTION INTERNAL FIXATION (ORIF) LEFT DISTAL RADIAL FRACTURE;  Surgeon: Renette Butters, MD;  Location: [facility];  Service: Orthopedics;  Laterality: Left;  . RIGHT/LEFT HEART CATH AND CORONARY ANGIOGRAPHY N/A 06/30/2017   Procedure: Right/Left Heart Cath and Coronary Angiography;  Surgeon: Leonie Man, MD;  Location[facility]:  pRCA 55%, pCx 40%, OM1 45%, mCx 50%, D2 50% - LVEF 25-35%. Moderately elevated LVEDP(26 mmHg with PCWP 16 mmHg).  FICK CO/CI: 4.47/2.48. PA pressures 47/14 mmHg with a mean of 27 mm.  Marland Kitchen RIGHT/LEFT HEART CATH AND CORONARY ANGIOGRAPHY N/A 01/10/2023   Procedure: RIGHT/LEFT HEART CATH AND CORONARY ANGIOGRAPHY;  Surgeon: Troy Sine, MD;  Location: [place] [facility];  Service: Cardiovascular;  Laterality: N/A;  . ROTATOR CUFF REPAIR     left shoulder  .  TEMPORARY PACEMAKER N/A 01/19/2023   Procedure: TEMPORARY PACEMAKER;  Surgeon: Jolaine Artist, MD;  Location: [place] [facility];  Service: Cardiovascular;  Laterality: N/A;  . TRANSTHORACIC ECHOCARDIOGRAM  06/2017   EF 25 and 30%. GR 1 DD. Mild diastolic dysfunction with elevated LVEDP. Mild valvular disease.  . TRANSTHORACIC ECHOCARDIOGRAM  11'18, 6/'19    a) EF remains 30-35%.  Diffuse hypokinesis noted.  Severe LA dilation; b) Improved EF 35-40%.  Diffuse HK.  GR 1 DD.  Moderate TR.  Mild RV dilation.  . TRANSTHORACIC ECHOCARDIOGRAM  01/28/2022   Follow-up echo: EF 50 to 55%.  No RWMA.  GRII DD.  Severe LA dilation.  Mild RA dilation with normal PAP and RAP.  Mild to moderate TR (no significant change noted)  . Zio patch  08/2022   3-day: Currently NSR (HR range 47-91 with average 64 bpm) 1 F AVB with IVCD/BBB.  Rare isolated PACs (<1%).  Frequent PVCs (6.1% with rare couplets and triplets.  Also bigeminy.  2 atrial runs 8 beats 5 2 seconds.  No sustained arrhythmias.  Marland Kitchen ZIO Patch 14 d Event Monitor  10/2018   Results as below < 1% PVC.  (Notably improved after starting amiodarone)   Family History  Problem Relation Age of Onset  . Heart disease Mother        s/p pacemaker  . Diabetes Mother   . Heart disease Father   . Diabetes Father   . Heart disease Brother        MI  . Cancer Brother        Lung  . Hypertension Other   . Colon cancer Neg Hx   . Breast cancer Neg Hx    Social History:  reports that she quit smoking about 29 years ago. Her smoking use included cigarettes. She has never used smokeless tobacco. She reports that she does not drink alcohol and does not use drugs. Allergies:  Allergies  Allergen Reactions  . Ezetimibe-Simvastatin Other (See Comments)    REACTION: Muscle aches (side effect)  . Lipitor [Atorvastatin] Other (See Comments)    Leg weakness   . Claritin [Loratadine]     Possible cause of nightmares.    Marland Kitchen Spironolactone     Elevated  potassium/creatinine  . Tamiflu [Oseltamivir Phosphate] Other (See Comments)    nightmares  . Pravastatin Sodium Other (See Comments)    REACTION: Muscle aches (side effect)  . Sulfonamide Derivatives Nausea And Vomiting   Medications Prior to Admission  Medication Sig Dispense Refill  . acetaminophen (TYLENOL) 500 MG tablet Take 500 mg by mouth every 6 (six) hours as needed for mild pain.    Marland Kitchen amiodarone (PACERONE) 200 MG tablet Take 1 tablet (200 mg total) by mouth daily. 30 tablet 0  . apixaban (ELIQUIS) 2.5 MG TABS tablet Take 1 tablet (2.5 mg total) by mouth 2 (two) times daily. 60 tablet 0  . aspirin EC 81 MG tablet Take 81 mg by mouth daily. Discontinued while inpatient, Do NOT restart (Patient not taking: Reported on 02/19/2023)    . carvedilol (COREG) 25 MG tablet Take 25 mg by mouth 2 (two) times daily with a meal. (Patient not taking: Reported on 02/19/2023)    . Cholecalciferol (VITAMIN D) 1000 UNITS capsule Take 1,000 Units by mouth daily.    . clopidogrel (PLAVIX) 75 MG tablet Take 1 tablet (75 mg total) by mouth daily. 30 tablet 0  . clotrimazole (GYNE-LOTRIMIN) 1 % vaginal cream Place 1 Applicatorful vaginally at bedtime. 45 g 0  . empagliflozin (JARDIANCE) 10 MG TABS tablet Take 5 mg by mouth daily. Discontinued while inpatient, F/u restart once infections resolve/outpatient (Patient not taking: Reported on 02/19/2023)    . feeding supplement (ENSURE ENLIVE / ENSURE PLUS) LIQD Take 237 mLs by mouth 3 (three) times daily between meals. 237 mL 12  . furosemide (LASIX) 20 MG tablet Take 20 mg by mouth. (Patient not taking: Reported on 02/19/2023)    . Ipratropium-Albuterol (COMBIVENT RESPIMAT) 20-100 MCG/ACT AERS respimat Inhale 1 puff into the lungs every 6 (six) hours as needed for wheezing or shortness of breath. 4 g 0  . losartan (COZAAR) 25 MG tablet Take 0.5 tablets (12.5 mg total) by mouth daily. 15 tablet 0  . metFORMIN (GLUCOPHAGE) 500 MG tablet Take 250 mg by mouth daily  with breakfast. (Patient not taking: Reported on 02/19/2023)    . nutrition supplement, JUVEN, (JUVEN) PACK Take 1 packet  by mouth 2 (two) times daily between meals.  0  . rosuvastatin (CRESTOR) 10 MG tablet TAKE 1 TABLET(10 MG) BY MOUTH DAILY (Patient taking differently: Take 10 mg by mouth daily.) 90 tablet 3  . sacubitril-valsartan (ENTRESTO) 97-103 MG Take 1 tablet by mouth 2 (two) times daily. (Patient not taking: Reported on 02/19/2023)    . spironolactone (ALDACTONE) 25 MG tablet Take 0.5 tablets (12.5 mg total) by mouth daily. 15 tablet 0  . torsemide (DEMADEX) 20 MG tablet Take 1 tablet (20 mg total) by mouth daily. 30 tablet 0  . traMADol (ULTRAM) 50 MG tablet Take 1 tablet (50 mg total) by mouth every 12 (twelve) hours. 30 tablet       Home: Home Living Family/patient expects to be discharged to:: Private residence Living Arrangements: Spouse/significant other   Functional History:    Functional Status:  Mobility:          ADL:    Cognition: Cognition Orientation Level: Oriented X4    Physical Exam: Blood pressure (!) 115/56, pulse 70, temperature 97.8 F (36.6 C), temperature source Oral, resp. rate 16, SpO2 98 %. Physical Exam  Results for orders placed or performed during the hospital encounter of 02/19/23 (from the past 48 hour(s))  Glucose, capillary     Status: Abnormal   Collection Time: 02/19/23  4:32 PM  Result Value Ref Range   Glucose-Capillary 133 (H) 70 - 99 mg/dL    Comment: Glucose reference range applies only to samples taken after fasting for at least 8 hours.  Glucose, capillary     Status: Abnormal   Collection Time: 02/19/23 10:01 PM  Result Value Ref Range   Glucose-Capillary 164 (H) 70 - 99 mg/dL    Comment: Glucose reference range applies only to samples taken after fasting for at least 8 hours.  Glucose, capillary     Status: Abnormal   Collection Time: 02/20/23  6:08 AM  Result Value Ref Range   Glucose-Capillary 101 (H) 70 - 99  mg/dL    Comment: Glucose reference range applies only to samples taken after fasting for at least 8 hours.  Comprehensive metabolic panel     Status: Abnormal   Collection Time: 02/20/23  7:17 AM  Result Value Ref Range   Sodium 142 135 - 145 mmol/L   Potassium 4.1 3.5 - 5.1 mmol/L   Chloride 103 98 - 111 mmol/L   CO2 28 22 - 32 mmol/L   Glucose, Bld 108 (H) 70 - 99 mg/dL    Comment: Glucose reference range applies only to samples taken after fasting for at least 8 hours.   BUN 48 (H) 8 - 23 mg/dL   Creatinine, Ser 1.57 (H) 0.44 - 1.00 mg/dL   Calcium 9.8 8.9 - 10.3 mg/dL   Total Protein 6.5 6.5 - 8.1 g/dL   Albumin 3.0 (L) 3.5 - 5.0 g/dL   AST 24 15 - 41 U/L   ALT 15 0 - 44 U/L   Alkaline Phosphatase 94 38 - 126 U/L   Total Bilirubin 0.9 0.3 - 1.2 mg/dL   GFR, Estimated 33 (L) >60 mL/min    Comment: (NOTE) Calculated using the CKD-EPI Creatinine Equation (2021)    Anion gap 11 5 - 15    Comment: Performed at [facility] [address] [place], [place] [address]  CBC with Differential/Platelet     Status: Abnormal   Collection Time: 02/20/23  7:17 AM  Result Value Ref Range   WBC 5.1  4.0 - 10.5 K/uL   RBC 3.69 (L) 3.87 - 5.11 MIL/uL   Hemoglobin 11.4 (L) 12.0 - 15.0 g/dL   HCT 37.1 36.0 - 46.0 %   MCV 100.5 (H) 80.0 - 100.0 fL   MCH 30.9 26.0 - 34.0 pg   MCHC 30.7 30.0 - 36.0 g/dL   RDW 18.3 (H) 11.5 - 15.5 %   Platelets 299 150 - 400 K/uL   nRBC 0.0 0.0 - 0.2 %   Neutrophils Relative % 51 %   Neutro Abs 2.6 1.7 - 7.7 K/uL   Lymphocytes Relative 26 %   Lymphs Abs 1.3 0.7 - 4.0 K/uL   Monocytes Relative 13 %   Monocytes Absolute 0.6 0.1 - 1.0 K/uL   Eosinophils Relative 8 %   Eosinophils Absolute 0.4 0.0 - 0.5 K/uL   Basophils Relative 1 %   Basophils Absolute 0.1 0.0 - 0.1 K/uL   Immature Granulocytes 1 %   Abs Immature Granulocytes 0.04 0.00 - 0.07 K/uL    Comment: Performed at [facility] [address] [place], [place] [address]   No results  found.    Blood pressure (!) 115/56, pulse 70, temperature 97.8 F (36.6 C), temperature source Oral, resp. rate 16, SpO2 98 %.  Medical Problem List and Plan: 1. Functional deficits secondary to ***  -patient may *** shower  -ELOS/Goals: ***  2.  Antithrombotics: -DVT/anticoagulation:  {VTE PROPHYLAXIS/ANTICOAGULATION [id number]  -antiplatelet therapy: Aspirin and Plavix for three weeks followed by aspirin alone  3. Pain Management: Tylenol as needed  4. Mood/Behavior/Sleep: LCSW to evaluate and provide emotional support  -antipsychotic agents: n/a  5. Neuropsych/cognition: This patient *** capable of making decisions on *** own behalf.  6. Skin/Wound Care: Routine skin care checks   7. Fluids/Electrolytes/Nutrition: Routine Is and Os and follow-up chemistries  8: Hypertension: monitor TID and prn  9: Hyperlipidemia: continue statin       ***  Barbie Banner, PA-C 02/20/2023

## 2023-02-20 NOTE — Progress Notes (Deleted)
[facility] Individual Statement of Services  Patient Name:  Natasha Woodard  Date:  02/20/2023  Welcome to the [facility].  Our goal is to provide you with an individualized program based on your diagnosis and situation, designed to meet your specific needs.  With this comprehensive rehabilitation program, you will be expected to participate in at least 3 hours of rehabilitation therapies Monday-Friday, with modified therapy programming on the weekends.  Your rehabilitation program will include the following services:  Physical Therapy (PT), Occupational Therapy (OT), Speech Therapy (ST), 24 hour per day rehabilitation nursing, Therapeutic Recreaction (TR), Neuropsychology, Care Coordinator, Rehabilitation Medicine, Nutrition Services, Pharmacy Services, and Other  Weekly team conferences will be held on Wednesdays to discuss your progress.  Your Inpatient Rehabilitation Care Coordinator will talk with you frequently to get your input and to update you on team discussions.  Team conferences with you and your family in attendance may also be held.  Expected length of stay: 8 to 12 days or less   Overall anticipated outcome: MOD I to Supervision  Depending on your progress and recovery, your program may change. Your Inpatient Rehabilitation Care Coordinator will coordinate services and will keep you informed of any changes. Your Inpatient Rehabilitation Care Coordinator's name and contact numbers are listed  below.  The following services may also be recommended but are not provided by the [facility]:   [place] will be made to provide these services after discharge if needed.  Arrangements include referral to agencies that provide these services.  Your insurance has been verified to be:   [employer] Your primary doctor is:  Elsie Stain,  [place]  Pertinent information will be shared with your doctor and your insurance company.  Inpatient Rehabilitation Care Coordinator:  Erlene Quan, [place] or [phone]  Information discussed with and copy given to patient by: Dyanne Iha, 02/20/2023, 9:23 AM

## 2023-02-20 NOTE — Progress Notes (Signed)
Assessed patients right groin wound this morning. I took off the vac, and the right groin wound looks healthy with beefy red granulation tissue and minimal bleeding. Groin wound progressing appropriately.  I placed a wet to dry dressing in the right groin for the time being until [facility] will replace the wound vac dressing today. Continue Monday and Thursday changes.  Vicente Serene, PA-C [facility]  8:19 AM, 02/20/2023

## 2023-02-20 NOTE — Discharge Instructions (Addendum)
Inpatient Rehab Discharge Instructions  Natasha Woodard Discharge date and time:  02/28/2023  Activities/Precautions/ Functional Status: Activity: no lifting, driving, or strenuous exercise until cleared by MD Diet: cardiac diet Wound Care:  wound vac changes as directed Functional status:  ___ No restrictions     ___ Walk up steps independently ___ 24/7 supervision/assistance   ___ Walk up steps with assistance _x__ Intermittent supervision/assistance  ___ Bathe/dress independently ___ Walk with walker     ___ Bathe/dress with assistance ___ Walk Independently    ___ Shower independently ___ Walk with assistance    __x_ Shower with assistance _x__ No alcohol     ___ Return to work/school ________  Special Instructions: No driving, alcohol consumption or tobacco use.  Recommend weighing yourself daily and record weight. Bring this information with you to follow-up appointment with PCP.  Recommend daily BP measurement in same arm and record time of day. Bring this information with you to follow-up appointment with PCP.  Vaginal Boil Care: Can use a warm compress over the groin twice daily to encourage the boil to drain.  Once that is draining, keep area clean and dry until it is finished.  These generally self resolve, however if it increases in discomfort, does not resolve within 1 to 2 weeks, or if you develop symptoms of infection such as fever/chills, follow-up with your PCP for antibiotics and possible in office incision and drainage.    COMMUNITY REFERRALS UPON DISCHARGE:    Home Health:   PT     OT     RN                  [facility] (formerly [facility])  [phone] *Please expect follow-up within 2-3 days to schedule your home visit. Start of care to begin on Monday with skilled nursing for wound vac change. Skilled nursing will provide once a week wound vac changes on Monday. If you have not received follow-up, be sure to contact the branch  directly.*    Medical Equipment/Items Ordered: rollator                                                 Agency/Supplier: [employer] [phone]  GENERAL COMMUNITY RESOURCES FOR PATIENT/FAMILY: Aside from once a week wound vac changes with [facility], you will ALSO, have wound vac changes once a week at vascular surgeon's office every Thursday. You are currently scheduled for appointments on Thursday. Please discuss with Dr. Virl Cagey future appointments. You have the following appointments as scheduled.  Thursday, March 28 at 1:15pm Thursday, April 4th at 2pm Thursday, April 11 at 2pm Thursday, April 18th at 2pm   My questions have been answered and I understand these instructions. I will adhere to these goals and the provided educational materials after my discharge from the hospital.  Patient/Caregiver Signature _______________________________ Date __________  Clinician Signature _______________________________________ Date __________  Please bring this form and your medication list with you to all your follow-up doctor's appointments.

## 2023-02-20 NOTE — Progress Notes (Signed)
Inpatient Rehabilitation Care Coordinator Assessment and Plan Patient Details  Name: Natasha Woodard MRN: [id number] Date of Birth: 1941/09/25  Today's Date: 02/20/2023  Hospital Problems: Principal Problem:   Debility  Past Medical History:  Past Medical History:  Diagnosis Date   AICD (automatic cardioverter/defibrillator) present    Calcific tendonitis 2008   Treatment of left leg   Cardiomyopathy ([place]) 06/2017   a) Echo: EF 25-30%. GR 1-2 DD w/ elevated LVEDP. Mild valvular Dz; b) Cardiac MRI 2/'19: frequent PVCs (diffiuclt to interpret) - EF ~27% w/ diffuse HK.  No evidence of infarct, infiltrative Dz or myocarditis. -- ? if related to PVCs. c) f/u Echo 6/'19: EF 35-40%. Gr 1 DD. Diffuse HK.-> d) Jan 2020 EF 50 to 55%.  Mod MAC, mod LAE.  Ao Sclerosis; e) Echo 1/'21 - EF 60-65%, Gr II DD. Mild Mod MR.    Chronic combined systolic and diastolic CHF, NYHA class 2 and ACA/AHA stage C    Now essentially resolved, back to simply diastolic CHF   Coronary artery disease, non-occlusive    mild-moderate CAD 06/30/17 cath   CTS (carpal tunnel syndrome)    Diabetes mellitus    Type II   Dysrhythmia    patient said that she cant remember what it is   Frequent unifocal PVCs 01/2018   Event Monitor: NSR, W/ frequent multifocal PVCs (9%-down from 30% prior to amiodarone) and PACs.  Nighttime bradycardia suggestive of OSA.';  Zio patch September 2023: PVC burden now 6.1%.   Hiatal hernia    with reflux   Hyperlipidemia    Statin intolerant   Hypertension    Ichthyosis congenita    Intermittent claudication of both lower extremities due to atherosclerosis ([place]) 08/22/2021   LEA Dopplers 09/09/2021:  Right: Total occlusion noted in the superficial femoral artery. Atherosclerosis noted throughout extremity, see note above. Three vessel runoff.  Left: Total occlusion noted in the superficial femoral artery and/or popliteal artery. Total occlusion noted in the distal anterior tibial artery.  Athereosclerosis noted throughout extremity.    NSVD (normal spontaneous vaginal delivery) 1971 & 1972   Obesity    Plantar fasciitis    Left   Presence of permanent cardiac pacemaker    Pulmonary nodule    Imaged multiple times and benign appearing   Sleep apnea    Wears glasses    Past Surgical History:  Past Surgical History:  Procedure Laterality Date   ABDOMINAL HYSTERECTOMY  1975   TAH-BSO   ABSCESS DRAINAGE  1972   right breast   APPLICATION OF WOUND VAC Right 01/15/2023   Procedure: APPLICATION OF INCISIONAL WOUND VAC;  Surgeon: Broadus John, MD;  Location: [facility] OR;  Service: Vascular;  Laterality: Right;   APPLICATION OF WOUND VAC Right 01/26/2023   Procedure: APPLICATION OF WOUND VAC RIGHT GROIN WASHOUT AND SARTORIUS MUSCLE FLAP;  Surgeon: Broadus John, MD;  Location: [place];  Service: Vascular;  Laterality: Right;   ARTERY REPAIR Right 01/15/2023   Procedure: RIGHT PROFUNDA ARTERY REPAIR WITH 2000G MYRIAD MORCELLS;  Surgeon: Broadus John, MD;  Location: [place];  Service: Vascular;  Laterality: Right;   BREAST BIOPSY Left 01/14/2009   Stereo- Benign   CARDIAC MRI  01/2018   Difficult to interpret 2/2 PVCs. Normal LV size - EF ~27% with diffuse HK. Mild RV dilation - normal fxn. -- NO MYOCARDIAL LGI => no definitive evidence of prior MI, Infiltrative Dz or Myocarditis -- suspect NICM, possibly related to PVCs.  CARPAL TUNNEL RELEASE Right 07/02/2014   Procedure: RIGHT CARPAL TUNNEL RELEASE;  Surgeon: Wynonia Sours, MD;  Location: [place];  Service: Orthopedics;  Laterality: Right;   CARPAL TUNNEL RELEASE Left 06/11/2015   Procedure: LEFT CARPAL TUNNEL RELEASE;  Surgeon: Daryll Brod, MD;  Location: [place];  Service: Orthopedics;  Laterality: Left;  REGIONAL/FAB   COLONOSCOPY     corn removal  1968   right   CORONARY STENT INTERVENTION N/A 01/10/2023   Procedure: CORONARY STENT INTERVENTION;  Surgeon: Troy Sine, MD;  Location: [place] [facility];  Service: Cardiovascular;  Laterality: N/A;   GROIN DEBRIDEMENT Right 02/06/2023   Procedure: RIGHT GROIN DEBRIDEMENT WITH WOUND VAC CHANGE;  Surgeon: Broadus John, MD;  Location: [place];  Service: Vascular;  Laterality: Right;   HEMATOMA EVACUATION Right 01/15/2023   Procedure: EVACUATION HEMATOMA RIGHT GROIN;  Surgeon: Broadus John, MD;  Location: [facility] OR;  Service: Vascular;  Laterality: Right;   Holter Monitor  06/2017   ~17,000 PVC beats - majority were singlets, some couplets. 44 brief 3-7 beat runs of NSVT. Also noted were less frequent PACs with 11 runs (longest 15 beats)   IABP INSERTION Right 01/10/2023   Procedure: IABP Insertion;  Surgeon: Troy Sine, MD;  Location: [place] [facility];  Service: Cardiovascular;  Laterality: Right;   ICD IMPLANT N/A 01/23/2023   Procedure: ICD IMPLANT;  Surgeon: Evans Lance, MD;  Location: [place] [facility];  Service: Cardiovascular;  Laterality: N/A;   INCISION AND DRAINAGE OF WOUND Right 01/31/2023   Procedure: IRRIGATION AND DEBRIDEMENT RIGHT GROIN WOUND AND WOUND VAC CHANGE;  Surgeon: Broadus John, MD;  Location: [place];  Service: Vascular;  Laterality: Right;   OPEN REDUCTION INTERNAL FIXATION (ORIF) DISTAL RADIAL FRACTURE Left 07/21/2017   Procedure: OPEN REDUCTION INTERNAL FIXATION (ORIF) LEFT DISTAL RADIAL FRACTURE;  Surgeon: Renette Butters, MD;  Location: [place];  Service: Orthopedics;  Laterality: Left;   RIGHT/LEFT HEART CATH AND CORONARY ANGIOGRAPHY N/A 06/30/2017   Procedure: Right/Left Heart Cath and Coronary Angiography;  Surgeon: Leonie Man, MD;  Location: [facility] INVASIVE CV LAB:  pRCA 55%, pCx 40%, OM1 45%, mCx 50%, D2 50% - LVEF 25-35%. Moderately elevated LVEDP(26 mmHg with PCWP 16 mmHg).  FICK CO/CI: 4.47/2.48. PA pressures 47/14 mmHg with a mean of 27 mm.   RIGHT/LEFT HEART CATH AND CORONARY ANGIOGRAPHY N/A 01/10/2023   Procedure: RIGHT/LEFT HEART CATH AND CORONARY ANGIOGRAPHY;  Surgeon: Troy Sine, MD;  Location: [place] [facility];  Service: Cardiovascular;  Laterality: N/A;   ROTATOR CUFF REPAIR     left shoulder   TEMPORARY PACEMAKER N/A 01/19/2023   Procedure: TEMPORARY PACEMAKER;  Surgeon: Jolaine Artist, MD;  Location: [place] [facility];  Service: Cardiovascular;  Laterality: N/A;   TRANSTHORACIC ECHOCARDIOGRAM  06/2017   EF 25 and 30%. GR 1 DD. Mild diastolic dysfunction with elevated LVEDP. Mild valvular disease.   TRANSTHORACIC ECHOCARDIOGRAM  11'18, 6/'19    a) EF remains 30-35%.  Diffuse hypokinesis noted.  Severe LA dilation; b) Improved EF 35-40%.  Diffuse HK.  GR 1 DD.  Moderate TR.  Mild RV dilation.   TRANSTHORACIC ECHOCARDIOGRAM  01/28/2022   Follow-up echo: EF 50 to 55%.  No RWMA.  GRII DD.  Severe LA dilation.  Mild RA dilation with normal PAP and RAP.  Mild to moderate TR (no significant change noted)   Zio patch  08/2022  3-day: Currently NSR (HR range 47-91 with average 64 bpm) 1 F AVB with IVCD/BBB.  Rare isolated PACs (<1%).  Frequent PVCs (6.1% with rare couplets and triplets.  Also bigeminy.  2 atrial runs 8 beats 5 2 seconds.  No sustained arrhythmias.   ZIO Patch 14 d Event Monitor  10/2018   Results as below < 1% PVC.  (Notably improved after starting amiodarone)   Social History:  reports that she quit smoking about 29 years ago. Her smoking use included cigarettes. She has never used smokeless tobacco. She reports that she does not drink alcohol and does not use drugs.  Family / Support Systems Marital Status: Married How Long?: 28 years Patient Roles: Spouse, Parent Spouse/Significant Other: [name] Sr. (husband) [phone] Children: 2 children- Monna Fam (lives in [place]), and Burr Medico (dtr) lives in [place] Other Supports: grandson Hassell Done who lives in the home Anticipated Caregiver: pt husband and grandson Ability/Limitations of Caregiver: None. Pt husband intends to take 6 weeks of FMLA. Caregiver Availability: 24/7 Family  Dynamics: Pt lives with her husbad, and their grandson Hassell Done lives in the home.  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 Education: college Field seismologist - How often do you need to have someone help you when you read instructions, pamphlets, or other written material from your doctor or pharmacy?: Never Writes: Yes Employment Status: Retired Date Retired/Disabled/Unemployed: 2000 Age Retired: 57 Public relations account executive Issues: Denies Guardian/Conservator: N/A   Abuse/Neglect Abuse/Neglect Assessment Can Be Completed: Yes Physical Abuse: Denies Verbal Abuse: Denies Sexual Abuse: Denies Exploitation of patient/patient's resources: Denies Self-Neglect: Denies  Patient response to: Social Isolation - How often do you feel lonely or isolated from those around you?: Never  Emotional Status Pt's affect, behavior and adjustment status: Pt in good spirits. Pt HOH and requires time to process topics discussed. Recent Psychosocial Issues: Denies Psychiatric History: Denies Substance Abuse History: Denies  Patient / Family Perceptions, Expectations & Goals Pt/Family understanding of illness & functional limitations: Pt and family have a general understanding of care needs Premorbid pt/family roles/activities: Independent Anticipated changes in roles/activities/participation: Assistance with ADLs.IADLs Pt/family expectations/goals: Pt gola is to  work on walking, and navigating up/down stairs as well, and self care/.  [employer]: None Premorbid Home Care/DME Agencies: None Transportation available at discharge: TBD Is the patient able to respond to transportation needs?: Yes In the past 12 months, has lack of transportation kept you from medical appointments or from getting medications?: No In the past 12 months, has lack of  transportation kept you from meetings, work, or from getting things needed for daily living?: No Resource referrals recommended: Neuropsychology  Discharge Planning Living Arrangements: Spouse/significant other, Other relatives Support Systems: Spouse/significant other, Children, Other relatives Type of Residence: Private residence Insurance Resources: Multimedia programmer (specify) ([employer]) Financial Resources: Radio broadcast assistant Screen Referred: No Living Expenses: Own Money Management: Patient Does the patient have any problems obtaining your medications?: No Home Management: Pt reports she managed all homecare needs Patient/Family Preliminary Plans: TBD Care Coordinator Barriers to Discharge: Decreased caregiver support, Lack of/limited family support, Wound Care Care Coordinator Anticipated Follow Up Needs: HH/OP Expected length of stay: 2-2.5 weeks  Clinical Impression SW met with pt and pt son Monna Fam in room to introduce self, explain role, discuss discharge process, and inform on ELOS. Pt is not a English as a second language teacher. Pt husband is a English as a second language teacher  and beginning to explore if eligible for benefits. DME- walking stick that is used when [facility]). Pt aware SW left message for her husband.   *SW later saw pt husband and son. Pt husband has some FMLA forms. SW shared to complete his section, and will give forms to physician. He has six weeks of FMLA he intends to use.   Amery Vandenbos A Aquila Delaughter 02/20/2023, 4:21 PM

## 2023-02-20 NOTE — Plan of Care (Signed)
  Problem: RH Balance Goal: LTG: Patient will maintain dynamic sitting balance (OT) Description: LTG:  Patient will maintain dynamic sitting balance with assistance during activities of daily living (OT) Flowsheets (Taken 02/20/2023 1212) LTG: Pt will maintain dynamic sitting balance during ADLs with: Independent with assistive device Goal: LTG Patient will maintain dynamic standing with ADLs (OT) Description: LTG:  Patient will maintain dynamic standing balance with assist during activities of daily living (OT)  Flowsheets (Taken 02/20/2023 1212) LTG: Pt will maintain dynamic standing balance during ADLs with: Supervision/Verbal cueing   Problem: Sit to Stand Goal: LTG:  Patient will perform sit to stand in prep for activites of daily living with assistance level (OT) Description: LTG:  Patient will perform sit to stand in prep for activites of daily living with assistance level (OT) Flowsheets (Taken 02/20/2023 1212) LTG: PT will perform sit to stand in prep for activites of daily living with assistance level: Supervision/Verbal cueing   Problem: RH Grooming Goal: LTG Patient will perform grooming w/assist,cues/equip (OT) Description: LTG: Patient will perform grooming with assist, with/without cues using equipment (OT) Flowsheets (Taken 02/20/2023 1212) LTG: Pt will perform grooming with assistance level of: Independent   Problem: RH Bathing Goal: LTG Patient will bathe all body parts with assist levels (OT) Description: LTG: Patient will bathe all body parts with assist levels (OT) Flowsheets (Taken 02/20/2023 1212) LTG: Pt will perform bathing with assistance level/cueing: Independent with assistive device    Problem: RH Dressing Goal: LTG Patient will perform upper body dressing (OT) Description: LTG Patient will perform upper body dressing with assist, with/without cues (OT). Flowsheets (Taken 02/20/2023 1212) LTG: Pt will perform upper body dressing with assistance level of:  Independent with assistive device   Problem: RH Toileting Goal: LTG Patient will perform toileting task (3/3 steps) with assistance level (OT) Description: LTG: Patient will perform toileting task (3/3 steps) with assistance level (OT)  Flowsheets (Taken 02/20/2023 1212) LTG: Pt will perform toileting task (3/3 steps) with assistance level: Independent with assistive device   Problem: RH Simple Meal Prep Goal: LTG Patient will perform simple meal prep w/assist (OT) Description: LTG: Patient will perform simple meal prep with assistance, with/without cues (OT). Flowsheets (Taken 02/20/2023 1212) LTG: Pt will perform simple meal prep with assistance level of: Supervision/Verbal cueing   Problem: RH Light Housekeeping Goal: LTG Patient will perform light housekeeping w/assist (OT) Description: LTG: Patient will perform light housekeeping with assistance, with/without cues (OT). Flowsheets (Taken 02/20/2023 1212) LTG: Pt will perform light housekeeping with assistance level of: Supervision/Verbal cueing   Problem: RH Toilet Transfers Goal: LTG Patient will perform toilet transfers w/assist (OT) Description: LTG: Patient will perform toilet transfers with assist, with/without cues using equipment (OT) Flowsheets (Taken 02/20/2023 1212) LTG: Pt will perform toilet transfers with assistance level of: Supervision/Verbal cueing   Problem: RH Tub/Shower Transfers Goal: LTG Patient will perform tub/shower transfers w/assist (OT) Description: LTG: Patient will perform tub/shower transfers with assist, with/without cues using equipment (OT) Flowsheets (Taken 02/20/2023 1212) LTG: Pt will perform tub/shower stall transfers with assistance level of: Supervision/Verbal cueing

## 2023-02-20 NOTE — Evaluation (Signed)
Physical Therapy Assessment and Plan  Patient Details  Name: Natasha Woodard MRN: [id number] Date of Birth: 1941-10-03  PT Diagnosis: Difficulty walking, Muscle weakness, and Pain in groin Rehab Potential: Good ELOS: 7-10 days   Today's Date: 02/20/2023 PT Individual Time: [id number] PT Individual Time Calculation (min): 61 min    Hospital Problem: Principal Problem:   Debility   Past Medical History:  Past Medical History:  Diagnosis Date   AICD (automatic cardioverter/defibrillator) present    Calcific tendonitis 2008   Treatment of left leg   Cardiomyopathy ([place]) 06/2017   a) Echo: EF 25-30%. GR 1-2 DD w/ elevated LVEDP. Mild valvular Dz; b) Cardiac MRI 2/'19: frequent PVCs (diffiuclt to interpret) - EF ~27% w/ diffuse HK.  No evidence of infarct, infiltrative Dz or myocarditis. -- ? if related to PVCs. c) f/u Echo 6/'19: EF 35-40%. Gr 1 DD. Diffuse HK.-> d) Jan 2020 EF 50 to 55%.  Mod MAC, mod LAE.  Ao Sclerosis; e) Echo 1/'21 - EF 60-65%, Gr II DD. Mild Mod MR.    Chronic combined systolic and diastolic CHF, NYHA class 2 and ACA/AHA stage C    Now essentially resolved, back to simply diastolic CHF   Coronary artery disease, non-occlusive    mild-moderate CAD 06/30/17 cath   CTS (carpal tunnel syndrome)    Diabetes mellitus    Type II   Dysrhythmia    patient said that she cant remember what it is   Frequent unifocal PVCs 01/2018   Event Monitor: NSR, W/ frequent multifocal PVCs (9%-down from 30% prior to amiodarone) and PACs.  Nighttime bradycardia suggestive of OSA.';  Zio patch September 2023: PVC burden now 6.1%.   Hiatal hernia    with reflux   Hyperlipidemia    Statin intolerant   Hypertension    Ichthyosis congenita    Intermittent claudication of both lower extremities due to atherosclerosis ([place]) 08/22/2021   LEA Dopplers 09/09/2021:  Right: Total occlusion noted in the superficial femoral artery. Atherosclerosis noted throughout extremity, see note above. Three  vessel runoff.  Left: Total occlusion noted in the superficial femoral artery and/or popliteal artery. Total occlusion noted in the distal anterior tibial artery. Athereosclerosis noted throughout extremity.    NSVD (normal spontaneous vaginal delivery) 1971 & 1972   Obesity    Plantar fasciitis    Left   Presence of permanent cardiac pacemaker    Pulmonary nodule    Imaged multiple times and benign appearing   Sleep apnea    Wears glasses    Past Surgical History:  Past Surgical History:  Procedure Laterality Date   ABDOMINAL HYSTERECTOMY  1975   TAH-BSO   ABSCESS DRAINAGE  1972   right breast   APPLICATION OF WOUND VAC Right 01/15/2023   Procedure: APPLICATION OF INCISIONAL WOUND VAC;  Surgeon: Broadus John, MD;  Location: [facility] OR;  Service: Vascular;  Laterality: Right;   APPLICATION OF WOUND VAC Right 01/26/2023   Procedure: APPLICATION OF WOUND VAC RIGHT GROIN WASHOUT AND SARTORIUS MUSCLE FLAP;  Surgeon: Broadus John, MD;  Location: [place];  Service: Vascular;  Laterality: Right;   ARTERY REPAIR Right 01/15/2023   Procedure: RIGHT PROFUNDA ARTERY REPAIR WITH 2000G MYRIAD MORCELLS;  Surgeon: Broadus John, MD;  Location: [place];  Service: Vascular;  Laterality: Right;   BREAST BIOPSY Left 01/14/2009   Stereo- Benign   CARDIAC MRI  01/2018   Difficult to interpret 2/2 PVCs. Normal LV size - EF ~27% with diffuse  HK. Mild RV dilation - normal fxn. -- NO MYOCARDIAL LGI => no definitive evidence of prior MI, Infiltrative Dz or Myocarditis -- suspect NICM, possibly related to PVCs.   CARPAL TUNNEL RELEASE Right 07/02/2014   Procedure: RIGHT CARPAL TUNNEL RELEASE;  Surgeon: Wynonia Sours, MD;  Location: [place];  Service: Orthopedics;  Laterality: Right;   CARPAL TUNNEL RELEASE Left 06/11/2015   Procedure: LEFT CARPAL TUNNEL RELEASE;  Surgeon: Daryll Brod, MD;  Location: [place];  Service: Orthopedics;  Laterality: Left;  REGIONAL/FAB   COLONOSCOPY      corn removal  1968   right   CORONARY STENT INTERVENTION N/A 01/10/2023   Procedure: CORONARY STENT INTERVENTION;  Surgeon: Troy Sine, MD;  Location: [place] [facility] LAB;  Service: Cardiovascular;  Laterality: N/A;   GROIN DEBRIDEMENT Right 02/06/2023   Procedure: RIGHT GROIN DEBRIDEMENT WITH WOUND VAC CHANGE;  Surgeon: Broadus John, MD;  Location: [place];  Service: Vascular;  Laterality: Right;   HEMATOMA EVACUATION Right 01/15/2023   Procedure: EVACUATION HEMATOMA RIGHT GROIN;  Surgeon: Broadus John, MD;  Location: [facility] OR;  Service: Vascular;  Laterality: Right;   Holter Monitor  06/2017   ~17,000 PVC beats - majority were singlets, some couplets. 44 brief 3-7 beat runs of NSVT. Also noted were less frequent PACs with 11 runs (longest 15 beats)   IABP INSERTION Right 01/10/2023   Procedure: IABP Insertion;  Surgeon: Troy Sine, MD;  Location: [place] [facility];  Service: Cardiovascular;  Laterality: Right;   ICD IMPLANT N/A 01/23/2023   Procedure: ICD IMPLANT;  Surgeon: Evans Lance, MD;  Location: [place] [facility] LAB;  Service: Cardiovascular;  Laterality: N/A;   INCISION AND DRAINAGE OF WOUND Right 01/31/2023   Procedure: IRRIGATION AND DEBRIDEMENT RIGHT GROIN WOUND AND WOUND VAC CHANGE;  Surgeon: Broadus John, MD;  Location: [place];  Service: Vascular;  Laterality: Right;   OPEN REDUCTION INTERNAL FIXATION (ORIF) DISTAL RADIAL FRACTURE Left 07/21/2017   Procedure: OPEN REDUCTION INTERNAL FIXATION (ORIF) LEFT DISTAL RADIAL FRACTURE;  Surgeon: Renette Butters, MD;  Location: [place];  Service: Orthopedics;  Laterality: Left;   RIGHT/LEFT HEART CATH AND CORONARY ANGIOGRAPHY N/A 06/30/2017   Procedure: Right/Left Heart Cath and Coronary Angiography;  Surgeon: Leonie Man, MD;  Location: [facility] INVASIVE CV LAB:  pRCA 55%, pCx 40%, OM1 45%, mCx 50%, D2 50% - LVEF 25-35%. Moderately elevated LVEDP(26 mmHg with PCWP 16 mmHg).  FICK CO/CI: 4.47/2.48. PA pressures 47/14 mmHg with a  mean of 27 mm.   RIGHT/LEFT HEART CATH AND CORONARY ANGIOGRAPHY N/A 01/10/2023   Procedure: RIGHT/LEFT HEART CATH AND CORONARY ANGIOGRAPHY;  Surgeon: Troy Sine, MD;  Location: [place] [facility] LAB;  Service: Cardiovascular;  Laterality: N/A;   ROTATOR CUFF REPAIR     left shoulder   TEMPORARY PACEMAKER N/A 01/19/2023   Procedure: TEMPORARY PACEMAKER;  Surgeon: Jolaine Artist, MD;  Location: [place] CV LAB;  Service: Cardiovascular;  Laterality: N/A;   TRANSTHORACIC ECHOCARDIOGRAM  06/2017   EF 25 and 30%. GR 1 DD. Mild diastolic dysfunction with elevated LVEDP. Mild valvular disease.   TRANSTHORACIC ECHOCARDIOGRAM  11'18, 6/'19    a) EF remains 30-35%.  Diffuse hypokinesis noted.  Severe LA dilation; b) Improved EF 35-40%.  Diffuse HK.  GR 1 DD.  Moderate TR.  Mild RV dilation.   TRANSTHORACIC ECHOCARDIOGRAM  01/28/2022   Follow-up echo: EF 50 to 55%.  No RWMA.  GRII DD.  Severe LA dilation.  Mild RA dilation with normal PAP and RAP.  Mild to moderate TR (no significant change noted)   Zio patch  08/2022   3-day: Currently NSR (HR range 47-91 with average 64 bpm) 1 F AVB with IVCD/BBB.  Rare isolated PACs (<1%).  Frequent PVCs (6.1% with rare couplets and triplets.  Also bigeminy.  2 atrial runs 8 beats 5 2 seconds.  No sustained arrhythmias.   ZIO Patch 14 d Event Monitor  10/2018   Results as below < 1% PVC.  (Notably improved after starting amiodarone)    Assessment & Plan Clinical Impression: Patient is a 82 y.o. female who presented on January 10, 2023 with shortness of breath and chest pain. She was admitted with an acute lateral wall MI and was also found to have significant akinesis/hypokinesis with severe mitral regurgitation.  An IABP was placed in the Cath Lab.  Unfortunately, overnight she developed a hematoma to the IABP access site with associated limb ischemia and the IABP was removed.  Patient was seen by vascular surgery for assessment of a right femoral pseudoaneurysm  and a thrombin injection was performed.  On 01/15/2023 patient developed swelling in her right groin due to a significant hematoma which rapidly extended down the leg.  Patient underwent emergent right groin exploration and hematoma evacuation with right profunda femoris repair by vascular surgery.  On 01/19/2023 patient went into VT arrest in 01/20/2019 for a transvenous pacemaker placed with an ICD placed on 2/12.  On 01/24/2023 patient had her staples removed and a wound VAC replaced in the right groin area due to incisional breakdown and underlying tissue necrosis.  She went to the OR on 2/15 for washout, debridement, sartorious muscle flap and VAC replacement by Dr. Virl Cagey.  She has been followed essentially weekly by vascular surgery for VAC changes as well as wound washouts in the OR, the most recent of which was on February 06, 2023.  VAC changes have been converted to bedside and now wound care ostomy nurse is managing the VAC changes which are Monday and Thursdays. Wound cultures showed polymicrobial growth with moderate Morganella, Enterococcus and Pseudomonas. Pt has completed a course of IV Zosyn.  Patient has been up with PT and OT addressing self-care and mobility.  Most recently she has been min-guard assist for sit to stand transfers and min guard assist 30 feet x2  for gait needing cues for safety, posture.  Patient transferred to CIR on 02/19/2023 .   Patient currently requires min with mobility secondary to muscle weakness and muscle joint tightness, decreased cardiorespiratoy endurance, and decreased sitting balance, decreased standing balance, and decreased balance strategies.  Prior to hospitalization, patient was modified independent  with mobility and lived with Spouse in a House home.  Home access is 2Stairs to enter.  Patient will benefit from skilled PT intervention to maximize safe functional mobility, minimize fall risk, and decrease caregiver burden for planned discharge home with 24 hour  supervision.  Anticipate patient will  HHPT vs OPPT  at discharge.  PT - End of Session Activity Tolerance: Decreased this session Endurance Deficit: Yes PT Assessment Rehab Potential (ACUTE/IP ONLY): Good PT Patient demonstrates impairments in the following area(s): Balance;Endurance;Motor;Pain;Skin Integrity PT Transfers Functional Problem(s): Bed Mobility;Bed to Chair;Car;Furniture PT Locomotion Functional Problem(s): Ambulation;Wheelchair Mobility;Stairs PT Plan PT Intensity: Minimum of 1-2 x/day ,45 to 90 minutes PT Frequency: 5 out of 7 days PT Duration Estimated Length of Stay: 7-10 days PT Treatment/Interventions: Ambulation/gait  training;Balance/vestibular training;Community reintegration;Discharge planning;Disease management/prevention;DME/adaptive equipment instruction;Functional mobility training;Neuromuscular re-education;Pain management;Patient/family education;Psychosocial support;Skin care/wound management;Splinting/orthotics;Stair training;Therapeutic Activities;Therapeutic Exercise;UE/LE Strength taining/ROM;UE/LE Coordination activities;Wheelchair propulsion/positioning PT Transfers Anticipated Outcome(s): mod I basic transfers; supervision car PT Locomotion Anticipated Outcome(s): mod I household distances; supervision community and stairs PT Recommendation Recommendations for Other Services: Therapeutic Recreation consult Therapeutic Recreation Interventions: Outing/community reintergration Follow Up Recommendations: Home health PT;Outpatient PT (HHPT vs OPPT) Patient destination: Home Equipment Recommended: To be determined Equipment Details: has SPC; may benefit from RW at d/c   PT Evaluation Precautions/Restrictions Precautions Precautions: Fall;ICD/Pacemaker;Other (comment) Precaution Comments: wound VAC rt groin, LUE ICD placed 2/12 no lifting LUE past shoulder or L shld resistance exercise x6 weeks Restrictions Weight Bearing Restrictions:  No  Pain REports pain in R groin area and from yeast infection. Premedicated. Rest breaks as needed.  Pain Interference Pain Interference Pain Effect on Sleep: 3. Frequently Pain Interference with Therapy Activities: 2. Occasionally Pain Interference with Day-to-Day Activities: 2. Occasionally Home Living/Prior Functioning Home Living Available Help at Discharge: Family;Available 24 hours/day Type of Home: House Home Access: Stairs to enter [employer] of Steps: 2 Entrance Stairs-Rails: Right;Left Home Layout: Two level;Bed/bath upstairs Alternate Level Stairs-Number of Steps: 14 Alternate Level Stairs-Rails: Left;Right (1/2 way on R) Bathroom Shower/Tub: Chiropodist: Standard Bathroom Accessibility: Yes  Lives With: Spouse Prior Function Level of Independence: Independent with basic ADLs;Independent with transfers;Independent with homemaking with ambulation;Independent with gait  Able to Take Stairs?: Yes Driving: Yes Vision/Perception  Vision - History Ability to See in Adequate Light: 0 Adequate Perception Perception: Within Functional Limits Praxis Praxis: Intact  Cognition Overall Cognitive Status: Within Functional Limits for tasks assessed Attention: Selective Memory: Impaired Memory Impairment: Decreased recall of new information Awareness: Appears intact Problem Solving: Appears intact Safety/Judgment: Appears intact Comments: HOH impacts retention and attention despite hearing aides Sensation Sensation Light Touch: Appears Intact Coordination Gross Motor Movements are Fluid and Coordinated: Yes (BLE) Fine Motor Movements are Fluid and Coordinated: No Coordination and Movement Description: mild b distal hand tremors L>R Finger Nose Finger Test: mild dysmetria 9 Hole Peg Test: TBA Motor  Motor Motor: Other (comment) Motor - Skilled Clinical Observations: generalized weakness   Trunk/Postural Assessment  Cervical  Assessment Cervical Assessment: Within Functional Limits Thoracic Assessment Thoracic Assessment: Exception - mild kyphosis Lumbar Assessment Lumbar Assessment: Exception - posterior pelvic tilt Postural Control Postural Control: Deficits on evaluation Protective Responses: delayed Postural Limitations: posterior pelvis in sitting and limited postrual control in standing  Balance Balance Balance Assessed: Yes Static Sitting Balance Static Sitting - Level of Assistance: 5: Stand by assistance Dynamic Sitting Balance Dynamic Sitting - Level of Assistance: 5: Stand by assistance Static Standing Balance Static Standing - Level of Assistance: 4: Min assist Dynamic Standing Balance Dynamic Standing - Level of Assistance: 3: Mod assist;4: Min assist Extremity Assessment  RUE Assessment RUE Assessment: Within Functional Limits LUE Assessment General Strength Comments: unable to test L sh but otherwise grossly 3+/5 RLE Assessment RLE Assessment: Exceptions to [facility] General Strength Comments: grossly 4/5 LLE Assessment LLE Assessment: Exceptions to [facility] General Strength Comments: grossly 4/5  Care Tool Care Tool Bed Mobility Roll left and right activity   Roll left and right assist level: Minimal Assistance - Patient > 75%    Sit to lying activity        Lying to sitting on side of bed activity   Lying to sitting on side of bed assist level: the ability to move from lying on the back to  sitting on the side of the bed with no back support.: Minimal Assistance - Patient > 75%     Care Tool Transfers Sit to stand transfer   Sit to stand assist level: Minimal Assistance - Patient > 75%    Chair/bed transfer   Chair/bed transfer assist level: Minimal Assistance - Patient > 75%     Toilet transfer   Assist Level: Minimal Assistance - Patient > 75%    Car transfer   Car transfer assist level: Minimal Assistance - Patient > 75%      Care Tool Locomotion Ambulation   Assist  level: Minimal Assistance - Patient > 75% Assistive device: Hand held assist Max distance: 130'  Walk 10 feet activity   Assist level: Minimal Assistance - Patient > 75% Assistive device: Hand held assist   Walk 50 feet with 2 turns activity   Assist level: Minimal Assistance - Patient > 75% Assistive device: Hand held assist  Walk 150 feet activity Walk 150 feet activity did not occur: Safety/medical concerns      Walk 10 feet on uneven surfaces activity   Assist level: Moderate Assistance - Patient - 50 - 74% Assistive device: Hand held assist  Stairs   Assist level: Minimal Assistance - Patient > 75% Stairs assistive device: 2 hand rails Max number of stairs: 12  Walk up/down 1 step activity   Walk up/down 1 step (curb) assist level: Minimal Assistance - Patient > 75% Walk up/down 1 step or curb assistive device: 2 hand rails  Walk up/down 4 steps activity   Walk up/down 4 steps assist level: Minimal Assistance - Patient > 75% Walk up/down 4 steps assistive device: 2 hand rails  Walk up/down 12 steps activity   Walk up/down 12 steps assist level: Minimal Assistance - Patient > 75% Walk up/down 12 steps assistive device: 2 hand rails  Pick up small objects from floor   Pick up small object from the floor assist level: Moderate Assistance - Patient 50 - 74%    Wheelchair Is the patient using a wheelchair?: Yes Type of Wheelchair: Manual   Wheelchair assist level: Dependent - Patient 0% (will be primary ambulator)    Wheel 50 feet with 2 turns activity   Assist Level: Dependent - Patient 0%  Wheel 150 feet activity   Assist Level: Dependent - Patient 0%    Refer to Care Plan for Long Term Goals  SHORT TERM GOAL WEEK 1 PT Short Term Goal 1 (Week 1): = LTGs due to ELOS  Recommendations for other services: Therapeutic Recreation  Outing/community reintegration     Skilled Therapeutic Intervention   Evaluation completed (see details above and below) with education  on PT POC and goals and individual treatment initiated with focus on assessment of gait with and without AD, simulated car transfer with overall min assist (will plan to d/c with an SUV so will need to practice this set up more detailed in future session), stair negotiation for home access, and toilet transfers. Pt requires cues for safety and technique but overall functioning at CGA to min assist level. Discussed trial with SPC to simulate her device but also might recommend RW for energy conservation and overall balance to increase independence with mobility - pt verbalized understanding and agreement.    Mobility Bed Mobility Bed Mobility: Rolling Right;Rolling Left;Left Sidelying to Sit;Supine to Sit Rolling Right: Contact Guard/Touching assist Rolling Left: Contact Guard/Touching assist Left Sidelying to Sit: Minimal Assistance - Patient >75% Supine to Sit: Minimal  Assistance - Patient > 75% Transfers Transfers: Sit to Stand;Stand to Sit;Stand Pivot Transfers Sit to Stand: Minimal Assistance - Patient > 75% Stand to Sit: Minimal Assistance - Patient > 75% Stand Pivot Transfers: Minimal Assistance - Patient > 75% Stand Pivot Transfer Details: Verbal cues for technique;Verbal cues for precautions/safety Transfer (Assistive device): Rolling walker Locomotion  Gait Gait Distance (Feet): 130 Feet Assistive device: Rolling walker;1 person hand held assist Gait Gait Pattern: Impaired Stairs / Additional Locomotion Stairs: Yes Stairs Assistance: Minimal Assistance - Patient > 75% Stair Management Technique: Alternating pattern;Step to pattern;Two rails Number of Stairs: 12 Height of Stairs: 6 Ramp: Minimal Assistance - Patient >75% Wheelchair Mobility Wheelchair Mobility: No      Discharge Criteria: Patient will be discharged from PT if patient refuses treatment 3 consecutive times without medical reason, if treatment goals not met, if there is a change in medical status, if patient  makes no progress towards goals or if patient is discharged from hospital.  The above assessment, treatment plan, treatment alternatives and goals were discussed and mutually agreed upon: by patient and by family  Juanna Cao, PT, DPT, CBIS  02/20/2023, 12:43 PM

## 2023-02-21 LAB — GLUCOSE, CAPILLARY
Glucose-Capillary: 87 mg/dL (ref 70–99)
Glucose-Capillary: 128 mg/dL — ABNORMAL HIGH (ref 70–99)
Glucose-Capillary: 133 mg/dL — ABNORMAL HIGH (ref 70–99)
Glucose-Capillary: 112 mg/dL — ABNORMAL HIGH (ref 70–99)

## 2023-02-21 MED ORDER — CLOTRIMAZOLE 1 % EX CREA
TOPICAL_CREAM | Freq: Every day | CUTANEOUS | Status: AC
  Filled 2023-02-21 (×2): qty 15

## 2023-02-21 NOTE — Patient Care Conference (Signed)
Inpatient RehabilitationTeam Conference and Plan of Care Update Date: 02/21/2023   Time: 10:31 AM    Patient Name: Natasha Woodard      Medical Record Number: [id number]  Date of Birth: 02/25/1941 Sex: Female         Room/Bed: 4W15C/4W15C-01 Payor Info: Payor: [employer] MEDICARE / Plan: [facility] MEDICARE / Product Type: *No Product type* /    Admit Date/Time:  02/19/2023  3:30 PM  Primary Diagnosis:  [place] [facility] Problems: Principal Problem:   Debility    Expected Discharge Date: Expected Discharge Date: 02/28/23  Team Members Present: Physician leading conference: Dr. Durel Salts Social Worker Present: Loralee Pacas, [place] Nurse Present: Tacy Learn, RN PT Present: Terence Lux, PT OT Present: Cherylynn Ridges, OT PPS Coordinator present : Gunnar Fusi, SLP     Current Status/Progress Goal Weekly Team Focus  Bowel/Bladder   Pt is continent of b/b. LBM: 3/11   Pt will remain continent of b/b.   Assist w/ toileting needs q 2-4 hours and as needed.    Swallow/Nutrition/ Hydration               ADL's   Min A overall   Supervision/mod I   functional transfers, self-care retraining, activity tolerance, pt/family education, endurance    Mobility   Supv for all functional mobility with the use of a RW; CGA/MinA for mobility without the use of an AD- Has 15 steps at home   ModI/Supv with LRAD  Endurance/activity tolerance, global strengthening, dynamic stability, discharge planning, family education    Communication                Safety/Cognition/ Behavioral Observations               Pain   Pt is c/o pain in abdominal folds & R groin; also chronic back pain. Will rate 7-8 out of 10. Scheduled pain medication given per orders.   Pt will rate her pain lower than the inital rate given.   Assess pain q shift & PRN.    Skin   incision to R groin - wound vac placed / redness & abrasion to abdominal fold / Generalized dry flaky skin d/t hx  of ichthyosis congenita   Pt's wounds will heal in a timely manner w/o complications. Pt will keep her skin overall moisturized d/t skin condition.  Assess skin q shift & PRN.      Discharge Planning:  Pt will d/c to home with 24/7 care with her husband who has 6 wks of FMLA he intends to use. PRN support from dtr and  grandson.   Team Discussion: Debility. Continent B/B. Pain being managed with PRN medications. Wound vac to groin with changes on Monday and Thursday. Clotrimazole for yeast infection. Hydrocortisone cream for itching. IV ABT completed. Wound vac is biggest barrier with therapy. MinA overall for ADLs. Supervision for functional mobility with RW. CGA/MinA. CGA/Mina for mobility w/o the use of an AD. Working on steps.   Patient on target to meet rehab goals: yes, near goal level.   *See Care Plan and progress notes for long and short-term goals.   Revisions to Treatment Plan:  Medication adjustments, monitor labs  Teaching Needs: Medications, safety, self care, gait/transfer training, skin care, etc.   Current Barriers to Discharge: Decreased caregiver support, Home enviroment access/layout, and Wound care  Possible Resolutions to Barriers: Family education, nursing education, order recommended DME     Medical Summary Current Status: Medically complicated by R  groin pseudoaneurysm s/p washout with vac,  CHF, pain management, insomnia, diabetes, aki on ckd, anemia  Barriers to Discharge: Cardiac Complications;Complicated Wound;Medical stability;Renal Insufficiency/Failure;Self-care education;Uncontrolled Pain;Infection/IV Antibiotics  Barriers to Discharge Comments: wound vac, IV antibiotics, aki on ckd, pain management Possible Resolutions to Barriers/Weekly Focus: medication titration, encouraging fluids for renal recovery and losses via vac, wound monitorring   Continued Need for Acute Rehabilitation Level of Care: The patient requires daily medical management by a  physician with specialized training in physical medicine and rehabilitation for the following reasons: Direction of a multidisciplinary physical rehabilitation program to maximize functional independence : Yes Medical management of patient stability for increased activity during participation in an intensive rehabilitation regime.: Yes Analysis of laboratory values and/or radiology reports with any subsequent need for medication adjustment and/or medical intervention. : Yes   I attest that I was present, lead the team conference, and concur with the assessment and plan of the team.   Ernest Pine 02/21/2023, 2:02 PM

## 2023-02-21 NOTE — Progress Notes (Signed)
Occupational Therapy Note  Patient Details  Name: Natasha Woodard MRN: [id number] Date of Birth: 02-01-1941  Today's Date: 02/21/2023 OT Missed Time: 32 Minutes Missed Time Reason: Pain;Patient fatigue  Patient greeted semi-reclined in bed asleep. OT was able to wake pt, but she reported just having wound vac changed and was very fatigued and in pain. Pt requested to participate in therapy at a later time. Will attempt make up time as able.    [name] Mykael Batz 02/21/2023, 12:35 PM

## 2023-02-21 NOTE — Care Management (Signed)
[facility] Individual Statement of Services  Patient Name:  Natasha Woodard  Date:  02/21/2023  Welcome to the [facility].  Our goal is to provide you with an individualized program based on your diagnosis and situation, designed to meet your specific needs.  With this comprehensive rehabilitation program, you will be expected to participate in at least 3 hours of rehabilitation therapies Monday-Friday, with modified therapy programming on the weekends.  Your rehabilitation program will include the following services:  Physical Therapy (PT), Occupational Therapy (OT), 24 hour per day rehabilitation nursing, Therapeutic Recreaction (TR), Psychology, Neuropsychology, Care Coordinator, Rehabilitation Medicine, [place], and Other  Weekly team conferences will be held on Tuesdays to discuss your progress.  Your Inpatient Rehabilitation Care Coordinator will talk with you frequently to get your input and to update you on team discussions.  Team conferences with you and your family in attendance may also be held.  Expected length of stay: 7-12 days    Overall anticipated outcome: Independent with Assistive Device  Depending on your progress and recovery, your program may change. Your Inpatient Rehabilitation Care Coordinator will coordinate services and will keep you informed of any changes. Your Inpatient Rehabilitation Care Coordinator's name and contact numbers are listed  below.  The following services may also be recommended but are not provided by the [facility] will be made to provide these services after discharge if needed.  Arrangements include referral to agencies that provide these services.  Your insurance has been verified to be:  [facility]  Your primary  doctor is:  Elsie Stain  Pertinent information will be shared with your doctor and your insurance company.  Inpatient Rehabilitation Care Coordinator:  Cathleen Corti [phone] or [phone]  Information discussed with and copy given to patient by: Rana Snare, 02/21/2023, 9:32 AM

## 2023-02-21 NOTE — Progress Notes (Signed)
PROGRESS NOTE   Subjective/Complaints:  No events overnight. Reports foul odor from Vac this AM, wound care nursing came up to adjust and this helped significantly. She has ongoing issue with vaginal yeast and is requesting external topical medication for itching/rash in addition to internal applicator. Otherwise, no concerns, complaints. Had shower this AM which she is happy with.  Awaiting definitive plan for wound vac. Patient reportedly has good strength per PT/OT but safety with management of vac is large concern.   ROS: +pain at wound vac site - stable.  Denies fevers, chills, N/V, abdominal pain, constipation, diarrhea, SOB, cough, chest pain, new weakness or paraesthesias.     Objective:   No results found. Recent Labs    02/20/23 0717  WBC 5.1  HGB 11.4*  HCT 37.1  PLT 299    Recent Labs    02/20/23 0717  NA 142  K 4.1  CL 103  CO2 28  GLUCOSE 108*  BUN 48*  CREATININE 1.57*  CALCIUM 9.8     Intake/Output Summary (Last 24 hours) at 02/21/2023 1036 Last data filed at 02/20/2023 1230 Gross per 24 hour  Intake 240 ml  Output --  Net 240 ml         Physical Exam: Vital Signs Blood pressure 116/67, pulse 70, temperature 97.8 F (36.6 C), temperature source Oral, resp. rate 18, SpO2 100 %. Gen: no distress, normal appearing HENT:     Head: Normocephalic and atraumatic.     Ears:     Comments: + HOH bilaterally d/t cerumen impaction    Nose: Nose normal.     Mouth/Throat:     Mouth: Mucous membranes are moist.  Eyes:     Extraocular Movements: Extraocular movements intact.     Pupils: Pupils are equal, round, and reactive to light.  Cardiovascular:     Rate and Rhythm: Normal rate. Rhythm irregular.     Heart sounds: Murmur heard.  Pulmonary:     Effort: Pulmonary effort is normal. No respiratory distress.     Breath sounds: Normal breath sounds. No stridor. No wheezing.  Abdominal:      General: Bowel sounds are normal. There is no distension.     Palpations: Abdomen is soft.     Tenderness: There is no abdominal tenderness.  Musculoskeletal:        General: Normal range of motion.     Cervical back: Normal range of motion and neck supple.     Right lower leg: No edema.     Left lower leg: No edema.  Skin:    Comments: Skin moist/tacky along abdominal/inguinal folds. No frank erythema appreciated.  Vac in left groin packed well and sealed.  Wound with minimal serosanginous drainage into vac; continuous.  Dry, flaking skin throughout. Poor turgor.  Neurological:     Mental Status: She is alert.     Comments: Alert and oriented x 3. Normal insight and awareness. Intact Memory. Normal language and speech. Cranial nerve exam unremarkable. HOH, UE 5/5. LE 3+ prox to 4/5 distally. Sensory exam normal for light touch and pain in all 4 limbs. No limb ataxia or cerebellar signs. No abnormal tone appreciated.  Psychiatric:        Mood and Affect: Mood normal.        Behavior: Behavior normal.    Assessment/Plan: 1. Functional deficits which require 3+ hours per day of interdisciplinary therapy in a comprehensive inpatient rehab setting. Physiatrist is providing close team supervision and 24 hour management of active medical problems listed below. Physiatrist and rehab team continue to assess barriers to discharge/monitor patient progress toward functional and medical goals  Care Tool:  Bathing    Body parts bathed by patient: Right arm, Left arm, Chest, Right upper leg, Left upper leg, Face   Body parts bathed by helper: Abdomen, Front perineal area, Buttocks, Right lower leg, Left lower leg     Bathing assist Assist Level: Maximal Assistance - Patient 24 - 49%     Upper Body Dressing/Undressing Upper body dressing   What is the patient wearing?: Pull over shirt    Upper body assist Assist Level: Minimal Assistance - Patient > 75%    Lower Body  Dressing/Undressing Lower body dressing      What is the patient wearing?: Pants     Lower body assist Assist for lower body dressing: Maximal Assistance - Patient 25 - 49%     Toileting Toileting    Toileting assist Assist for toileting: Moderate Assistance - Patient 50 - 74%     Transfers Chair/bed transfer  Transfers assist     Chair/bed transfer assist level: Minimal Assistance - Patient > 75%     Locomotion Ambulation   Ambulation assist      Assist level: Minimal Assistance - Patient > 75% Assistive device: Hand held assist Max distance: 130'   Walk 10 feet activity   Assist     Assist level: Minimal Assistance - Patient > 75% Assistive device: Hand held assist   Walk 50 feet activity   Assist    Assist level: Minimal Assistance - Patient > 75% Assistive device: Hand held assist    Walk 150 feet activity   Assist Walk 150 feet activity did not occur: Safety/medical concerns         Walk 10 feet on uneven surface  activity   Assist     Assist level: Moderate Assistance - Patient - 50 - 74% Assistive device: Hand held assist   Wheelchair     Assist Is the patient using a wheelchair?: Yes Type of Wheelchair: Manual    Wheelchair assist level: Dependent - Patient 0% (will be primary ambulator)      Wheelchair 50 feet with 2 turns activity    Assist        Assist Level: Dependent - Patient 0%   Wheelchair 150 feet activity     Assist      Assist Level: Dependent - Patient 0%   Blood pressure 116/67, pulse 70, temperature 97.8 F (36.6 C), temperature source Oral, resp. rate 18, SpO2 100 %.    Medical Problem List and Plan: 1. Functional deficits secondary to debility after multiple medical issues after acute lateral wall MI and cardiogenic shock.              -course complicated by right femoral pseudoaneurysm which ultimately required I&D, washout as well as sartorius muscle flap and vac placement              -patient may not yet shower             -ELOS/Goals: 7-10 days, mod I to supervision goals DC goal 3/19   -  Barrier to DC plan for wound vac; will discuss with surgery closer to DC 2.  Antithrombotics: -DVT/anticoagulation:  Pharmaceutical: Eliquis             -antiplatelet therapy: plavix 3. Pain Management: scheduled tylenol and tramadol ordered. This is working for her.             -lidocaine patches prn 4. Mood/Behavior/Sleep: monitor sleep patterns             -melatonin PRN for sleep             -antipsychotic agents: n/a 5. Neuropsych/cognition: This patient is capable of making decisions on her own behalf. 6. Skin/Wound Care: vac changes right groin Mondays and Thursday             -Add InterDry dressing to abdominal/inguinal fold to control moisture  7. Fluids/Electrolytes/Nutrition: encourage appropriate PO inatke             -check labs Monday morning             -po intake has been sporadic             -protein/nutritional supps have been ordered for moderate protein deficient malnutrition  -  8. Acute on chronic systolic CHF/Acute lateral MI/PAF/VT arrest 2/7             -s/p DDD ICD on 2/12             -continue amiodarone '200mg'$  daily              -HR controlled, regular; normotentive; monitor             -volume mgt with demadex/aldactone             -cozaar 12.'5mg'$  daily    02/21/2023    7:48 PM 02/21/2023    3:09 PM 02/21/2023    5:31 AM  Vitals with BMI  Systolic [id number]  Diastolic 73 52 67  Pulse 71 71 70    9. Right femoral pseudoaneurysm             -wound care as above             -s/p sartorius rmuscle flap             -completed course of IV zosyn prior to IPR  10. AKI on CKD IIIb             -Baseline Cr 1.5 - stable, mild uptrend             -encouraged 6-8 glasses of water per day             -avoid nephrotoxic meds  11. Acute on chronic anemia             -s/p iron infusion             -check cbc in am             -optimize  nutritional status  12. Vaginal yeast             -topical clotrimazole for 6 more doses - ends Friday   - added external cream to internal; applicator as above  13. Cerumen impaction             -apparently attempts made to clear it             -outpt follow up recommended  14. Intertriginous dermatitis: monostat ordered  15. Insomnia: grounds pass ordered for sunlight  exposure during the day.    LOS: 2 days A FACE TO FACE EVALUATION WAS PERFORMED  Gertie Gowda 02/21/2023, 10:36 AM

## 2023-02-21 NOTE — Progress Notes (Signed)
Occupational Therapy Session Note  Patient Details  Name: Natasha Woodard MRN: [id number] Date of Birth: Sep 17, 1941  Today's Date: 02/21/2023 Session 1 OT Individual Time: [id number] OT Individual Time Calculation (min): 60 min   Short Term Goals: Week 1:  OT Short Term Goal 1 (Week 1): Pt will stand for grooming routine for up to 5 min with close S OT Short Term Goal 2 (Week 1): Pt will complete LB sponge bathing and dressing with CGA with AE sink side OT Short Term Goal 3 (Week 1): Pt will transfer on/off toilet with CGA with LRAD  Skilled Therapeutic Interventions/Progress Updates:    Session 1 Pt greeted sitting EOB and agreeable to OT treatment session. Addressed standing balance and endurance with standing grooming tasks at the sink. Pt tolerated standing for 3 minutes without rest breaks. OT educated on LB dressing strategies using figure 4 position which she was able to demonstrate understanding well. Functional ambulation with Rw with supervision and no overt LOB. Pt reported itching from peri-area and buttocks due to yeast infection. Pt set-up with breakfast tray and completed self feeding tasks without assistance. Discussed PLOF and OT goals. Pt left seated in wc with breakfast tray and needs met.    Therapy Documentation Precautions:  Precautions Precautions: Fall, ICD/Pacemaker, Other (comment) Precaution Comments: wound VAC rt groin, LUE ICD placed 2/12 no lifting LUE past shoulder or L shld resistance exercise x6 weeks Restrictions Weight Bearing Restrictions: No  Pain: Pain Assessment Pain Scale: 0-10 Pain Score: 8  Pain Intervention(s): Medication (See eMAR)    Therapy/Group: Individual Therapy  Valma Cava 02/21/2023, 8:57 AM

## 2023-02-21 NOTE — Progress Notes (Signed)
Patient ID: Natasha Woodard, female   DOB: 16-Apr-1941, 82 y.o.   MRN: [id number]  SW met with pt in room to provide updates from team conference, and d/c date 3/19. SW discussed family education. SW shared will confim wound vac is needed at time of discharge. Pt asked SW to writeup what was discussed in conference today so she can discuss with her family.   46- SW left message for pt husbnad [name] to provide updates from team conference and d/c date. SW requested return phone call to schedule family education.  *SW later provided pt with write up with conference updates, and OT/PT guide for levels of assistance.   Tentative wound vac referral sent to [facility] with [facility].   Loralee Pacas, MSW, [place] Office: [phone] Cell: [phone] Fax: [phone]

## 2023-02-21 NOTE — Progress Notes (Signed)
Physical Therapy Session Note  Patient Details  Name: Natasha Woodard MRN: [id number] Date of Birth: 1941-06-15  Today's Date: 02/21/2023 PT Individual Time: 1st Treatment Session: 0900-1000; 2nd Treatment Session: [phone] PT Individual Time Calculation (min): 60 min; 53 min  Short Term Goals: Week 1:  PT Short Term Goal 1 (Week 1): = LTGs due to ELOS  Skilled Therapeutic Interventions/Progress Updates:  1st Treatment Session- Patient greeted sitting upright in wheelchair in room and agreeable to PT treatment session. Patient wheeled to rehab gym for time management and energy conservation. Therapist managing wound vac throughout entire treatment session.   Patient performed various sit/stands with and without the use of a RW and Supv.   Patient gait trained x350' with RW and Supv- VC for improved postural extension, forward gaze, decreased B UE support and stepping within the RW with good effort noted, however unable to sustain as she fatigues.   Patient gait trained x180' with R UE wrapped around therapist's hips and MinA for improved overall stability- VC for improved postural extension while therapist facilitated approximation at R hip during stance phase for improved stability.   Patient then gait trained x120' and x100' without any UE support and MinA for improved stability- VC throughout for improved R glute activation throughout stance phase in order to improve hips stability.  During second gait trial, patient requesting to go to the bathroom in her room.   Patient sat on the toilet and noted to have incontinent episode of urine through her underwear and pants. Once seated on the toilet patient was still able to have a small continent void. Patient was able to perform pericare with set-up assistance. While seated on the toilet, therapist assisted with removing soiled clothing and donning clean clothes. Patient ambulated back to her wheelchair without the use of an AD and MinA for  stability.   Patient left seated upright in wheelchair with tray table in front, call bell within reach and all needs met. Wound vac was beeping at end of treatment session with RN notified and aware- Physician also aware of patient's request for cream to assist with itching of yeast infection.    2nd Treatment Session-  Patient greeted sitting EOB in her room with spouse present and agreeable to PT treatment session- Patient requesting to use the restroom prior to heading to the rehab gym. Patient ambulated to/from the bathroom in her room with RW and Supv- Patient with continent void and BM. Patient was able to perform front and back pericare with set-up assistance. Therapist donned brief (in order to keep her clothes as clean as possible since patient has had several incontinent episodes due to the yeast infection) and threaded pants. Patient gait trained from her room to day room (~100') with RW and Supv- VC for decreased UE support and stepping within the RW.   Patient gait trained from the day room to the main rehab gym (>150') with RW and supv with same VC as above.   Patient ascended/descended x16 steps (x4 with B HR and x12 with S HR) and CGA for safety- VC for using a step-to pattern in order to conserve energy with stair mobility. Patient required seated rest break after stair training secondary to notable fatigue.   Patient gait trained back to her room and left sitting upright in wheelchair with tray table in front, call bell within reach and all needs met. Patient aware and agreeable to using call bell when needing to use the restroom or get to  the bed.    Therapy Documentation Precautions:  Precautions Precautions: Fall, ICD/Pacemaker, Other (comment) Precaution Comments: wound VAC rt groin, LUE ICD placed 2/12 no lifting LUE past shoulder or L shld resistance exercise x6 weeks Restrictions Weight Bearing Restrictions: No  Therapy/Group: Individual Therapy  Dawnisha Marquina 02/21/2023, 7:52 AM

## 2023-02-22 LAB — GLUCOSE, CAPILLARY
Glucose-Capillary: 100 mg/dL — ABNORMAL HIGH (ref 70–99)
Glucose-Capillary: 135 mg/dL — ABNORMAL HIGH (ref 70–99)
Glucose-Capillary: 108 mg/dL — ABNORMAL HIGH (ref 70–99)
Glucose-Capillary: 158 mg/dL — ABNORMAL HIGH (ref 70–99)

## 2023-02-22 MED ORDER — TRAMADOL HCL 50 MG PO TABS
25.0000 mg | ORAL_TABLET | Freq: Four times a day (QID) | ORAL | Status: DC | PRN
  Administered 2023-02-22 – 2023-02-24 (×5): 50 mg via ORAL
  Filled 2023-02-22 (×6): qty 1

## 2023-02-22 NOTE — Progress Notes (Signed)
PROGRESS NOTE   Subjective/Complaints:  No events overnight. Patient with multiple questions this AM regarding skin care, including treatment or intertrigo, yeast infections, wound vac management, and ichthyosis. Answered questions to the best of providers ability, patient accepts she will likely discharge with Vac but plan will be established closer to discharge.   Discussed strategies for mobilizing and showering with wound vac clamped at home.   ROS: +pain at wound vac site - stable. +Wound vac odor-stable. Denies fevers, chills, N/V, abdominal pain, constipation, diarrhea, SOB, cough, chest pain, new weakness or paraesthesias.     Objective:   No results found. Recent Labs    02/20/23 0717  WBC 5.1  HGB 11.4*  HCT 37.1  PLT 299    Recent Labs    02/20/23 0717  NA 142  K 4.1  CL 103  CO2 28  GLUCOSE 108*  BUN 48*  CREATININE 1.57*  CALCIUM 9.8     Intake/Output Summary (Last 24 hours) at 02/22/2023 2314 Last data filed at 02/22/2023 1843 Gross per 24 hour  Intake 595 ml  Output --  Net 595 ml         Physical Exam: Vital Signs Blood pressure (!) 114/46, pulse 69, temperature 98.1 F (36.7 C), resp. rate 18, SpO2 100 %. Gen: no distress, normal appearing HENT:     Head: Normocephalic and atraumatic.     Ears:     Comments: + HOH bilaterally d/t cerumen impaction    Nose: Nose normal.     Mouth/Throat:     Mouth: Mucous membranes are moist.  Eyes:     Extraocular Movements: Extraocular movements intact.     Pupils: Pupils are equal, round, and reactive to light.  Cardiovascular:     Rate and Rhythm: Normal rate. Rhythm irregular.     Heart sounds: Murmur heard.  Pulmonary:     Effort: Pulmonary effort is normal. No respiratory distress.     Breath sounds: Normal breath sounds. No stridor. No wheezing.  Abdominal:     General: Bowel sounds are normal. There is no distension.     Palpations:  Abdomen is soft.     Tenderness: There is no abdominal tenderness.  Musculoskeletal:        General: Normal range of motion.     Cervical back: Normal range of motion and neck supple.     Right lower leg: No edema.     Left lower leg: No edema.  Skin:    Comments: + intertrigo under abdominal pannus and in groin, with chronic appearing discoloration; interdermal dressings  Vac in left groin packed well and sealed.  Wound with minimal serosanginous drainage into vac; continuous.  + Dry, flaking skin throughout. Poor turgor.  + buttocks/sacrum unstageable d/t scabbing - improved from MASD on 3/11 as below       Neurological:     Mental Status: She is alert.     Comments: Alert and oriented x 3. Normal insight and awareness. Intact Memory. Normal language and speech. Cranial nerve exam unremarkable. HOH, UE 5/5. LE 3+ prox to 4/5 distally. Sensory exam normal for light touch and pain in all 4 limbs. No limb  ataxia or cerebellar signs. No abnormal tone appreciated.    Psychiatric:        Mood and Affect: Mood normal.        Behavior: Behavior normal.    Assessment/Plan: 1. Functional deficits which require 3+ hours per day of interdisciplinary therapy in a comprehensive inpatient rehab setting. Physiatrist is providing close team supervision and 24 hour management of active medical problems listed below. Physiatrist and rehab team continue to assess barriers to discharge/monitor patient progress toward functional and medical goals  Care Tool:  Bathing    Body parts bathed by patient: Right arm, Left arm, Chest, Right upper leg, Left upper leg, Face   Body parts bathed by helper: Abdomen, Front perineal area, Buttocks, Right lower leg, Left lower leg     Bathing assist Assist Level: Maximal Assistance - Patient 24 - 49%     Upper Body Dressing/Undressing Upper body dressing   What is the patient wearing?: Pull over shirt    Upper body assist Assist Level: Minimal Assistance -  Patient > 75%    Lower Body Dressing/Undressing Lower body dressing      What is the patient wearing?: Pants     Lower body assist Assist for lower body dressing: Maximal Assistance - Patient 25 - 49%     Toileting Toileting    Toileting assist Assist for toileting: Moderate Assistance - Patient 50 - 74%     Transfers Chair/bed transfer  Transfers assist     Chair/bed transfer assist level: Minimal Assistance - Patient > 75%     Locomotion Ambulation   Ambulation assist      Assist level: Minimal Assistance - Patient > 75% Assistive device: Hand held assist Max distance: 130'   Walk 10 feet activity   Assist     Assist level: Minimal Assistance - Patient > 75% Assistive device: Hand held assist   Walk 50 feet activity   Assist    Assist level: Minimal Assistance - Patient > 75% Assistive device: Hand held assist    Walk 150 feet activity   Assist Walk 150 feet activity did not occur: Safety/medical concerns         Walk 10 feet on uneven surface  activity   Assist     Assist level: Moderate Assistance - Patient - 50 - 74% Assistive device: Hand held assist   Wheelchair     Assist Is the patient using a wheelchair?: Yes Type of Wheelchair: Manual    Wheelchair assist level: Dependent - Patient 0% (will be primary ambulator)      Wheelchair 50 feet with 2 turns activity    Assist        Assist Level: Dependent - Patient 0%   Wheelchair 150 feet activity     Assist      Assist Level: Dependent - Patient 0%   Blood pressure (!) 114/46, pulse 69, temperature 98.1 F (36.7 C), resp. rate 18, SpO2 100 %.    Medical Problem List and Plan: 1. Functional deficits secondary to debility after multiple medical issues after acute lateral wall MI and cardiogenic shock.              -course complicated by right femoral pseudoaneurysm which ultimately required I&D, washout as well as sartorius muscle flap and vac  placement             -patient may not yet shower - will inquire with wound care nursing/Dr. Virl Cagey regarding showering with vac clamped             -  ELOS/Goals: 7-10 days, mod I to supervision goals DC goal 3/19   - Barrier to DC plan for wound vac; will discuss with surgery closer to DC, but likely DC home with vac 2.  Antithrombotics: -DVT/anticoagulation:  Pharmaceutical: Eliquis             -antiplatelet therapy: plavix 3. Pain Management: scheduled tylenol and tramadol ordered. This is working for her.             -lidocaine patches prn 4. Mood/Behavior/Sleep: monitor sleep patterns             -melatonin PRN for sleep             -antipsychotic agents: n/a 5. Neuropsych/cognition: This patient is capable of making decisions on her own behalf. 6. Skin/Wound Care/hx Ichthyosis: vac changes right groin Mondays and Thursday             -Add InterDry dressing to abdominal/inguinal fold to control moisture  - Vac changes per [facility]   - 3/13: Sacral MASD appears improving; continue barrier, prevention of moisture trapping  7. Fluids/Electrolytes/Nutrition: encourage appropriate PO inatke             -check labs Monday morning             -po intake has been sporadic             -protein/nutritional supps have been ordered for moderate protein deficient malnutrition  -  8. Acute on chronic systolic CHF/Acute lateral MI/PAF/VT arrest 2/7             -s/p DDD ICD on 2/12             -continue amiodarone '200mg'$  daily              -HR controlled, regular; normotentive; monitor             -volume mgt with demadex/aldactone             -cozaar 12.'5mg'$  daily    02/22/2023    8:21 PM 02/22/2023    5:34 PM 02/22/2023    5:52 AM  Vitals with BMI  Systolic [id number]  Diastolic 46 59 63  Pulse 69 70 71    9. Right femoral pseudoaneurysm             -wound care as above             -s/p sartorius rmuscle flap             -completed course of IV zosyn prior to IPR  10. AKI on CKD IIIb              -Baseline Cr 1.5 - stable, mild uptrend             -encouraged 6-8 glasses of water per day             -avoid nephrotoxic meds  11. Acute on chronic anemia             -s/p iron infusion             -check cbc in am             -optimize nutritional status  12. Vaginal yeast             -topical clotrimazole for 6 more doses - ends Friday   - added external cream to internal; applicator as above  13. Cerumen impaction             -  apparently attempts made to clear it             -outpt follow up recommended  14. Intertriginous dermatitis: monostat ordered  15. Insomnia: grounds pass ordered for sunlight exposure during the day.    LOS: 3 days A FACE TO [place] 02/22/2023, 11:14 PM

## 2023-02-22 NOTE — Telephone Encounter (Signed)
1 

## 2023-02-22 NOTE — Progress Notes (Signed)
Patient laying in bed. Wound vac running 125 continuous  without difficulty. 10 ml serosanguinous fluid noted, wound vac container marked.

## 2023-02-22 NOTE — IPOC Note (Signed)
Overall Plan of Care [facility]) Patient Details Name: Natasha Woodard MRN: [id number] DOB: August 29, 1941  Admitting Diagnosis: [facility] Problems: Principal Problem:   Debility     Functional Problem List: Nursing Medication Management, Safety, Nutrition, Pain, Endurance, Skin Integrity  PT Balance, Endurance, Motor, Pain, Skin Integrity  OT Balance, Cognition, Skin Integrity, Endurance, Motor, Pain, Other (Comment) (HOH)  SLP    TR         Basic ADL's: OT Grooming, Bathing, Dressing, Toileting     Advanced  ADL's: OT Simple Meal Preparation, Light Housekeeping, Laundry     Transfers: PT Bed Mobility, Bed to Chair, Musician, Manufacturing systems engineer, Metallurgist: PT Ambulation, Emergency planning/management officer, Stairs     Additional Impairments: OT    SLP        TR      Anticipated Outcomes Item Anticipated Outcome  Self Feeding indep  Swallowing      Basic self-care  mod Indep  Toileting  mod I   Bathroom Transfers Supervision  Bowel/Bladder  n/a  Transfers  mod I basic transfers; supervision car  Locomotion  mod I household distances; supervision community and stairs  Communication     Cognition     Pain  < 4 with prns  Safety/Judgment  manage w cues   Therapy Plan: PT Intensity: Minimum of 1-2 x/day ,45 to 90 minutes PT Frequency: 5 out of 7 days PT Duration Estimated Length of Stay: 7-10 days OT Intensity: Minimum of 1-2 x/day, 45 to 90 minutes OT Frequency: 5 out of 7 days OT Duration/Estimated Length of Stay: 10-12 d     Team Interventions: Nursing Interventions Patient/Family Education, Pain Management, Skin Care/Wound Management, Disease Management/Prevention, Medication Management, Discharge Planning  PT interventions Ambulation/gait training, Balance/vestibular training, Community reintegration, Discharge planning, Disease management/prevention, DME/adaptive equipment instruction, Functional mobility training, Neuromuscular  re-education, Pain management, Patient/family education, Psychosocial support, Skin care/wound management, Splinting/orthotics, Stair training, Therapeutic Activities, Therapeutic Exercise, UE/LE Strength taining/ROM, UE/LE Coordination activities, Wheelchair propulsion/positioning  OT Interventions Training and development officer, Disease mangement/prevention, Neuromuscular re-education, Self Care/advanced ADL retraining, Therapeutic Exercise, Wheelchair propulsion/positioning, Cognitive remediation/compensation, DME/adaptive equipment instruction, Pain management, Skin care/wound managment, UE/LE Strength taining/ROM, Community reintegration, Barrister's clerk education, UE/LE Coordination activities, Discharge planning, Functional mobility training, Psychosocial support, Therapeutic Activities  SLP Interventions    TR Interventions    SW/CM Interventions Discharge Planning, Psychosocial Support, Patient/Family Education   Barriers to Discharge MD  Medical stability, Home enviroment access/loayout, Wound care, and Lack of/limited family support  Nursing Decreased caregiver support, Home environment access/layout, Wound Care 2 level 3 ste +rails B +B up 14 steps w spouse/son  PT      OT Inaccessible home environment, Home environment access/layout, Wound Care 2 story home with bed and bath on 2nd level with FF, steps to enter, wound vac, L UE prec  SLP      SW Decreased caregiver support, Lack of/limited family support, Wound Care     Team Discharge Planning: Destination: PT-Home ,OT- Home , SLP-  Projected Follow-up: PT-Home health PT, Outpatient PT (HHPT vs OPPT), OT-  Home health OT (due to wound care potential- if not outpt OT), SLP-  Projected Equipment Needs: PT-To be determined, OT-  , SLP-  Equipment Details: PT-has SPC; may benefit from RW at d/c, OT-TTB, RW Patient/family involved in discharge planning: PT- Patient, Family member/caregiver,  OT-Patient, Family member/caregiver, SLP-    MD ELOS: 7-10 days Medical Rehab Prognosis:  Good Assessment: The patient has been  admitted for CIR therapies with the diagnosis of debility. The team will be addressing functional mobility, strength, stamina, balance, safety, adaptive techniques and equipment, self-care, bowel and bladder mgt, patient and caregiver education. Goals have been set at supervision. Anticipated discharge destination is home.       See Team Conference Notes for weekly updates to the plan of care

## 2023-02-22 NOTE — Progress Notes (Signed)
Occupational Therapy Session Note  Patient Details  Name: Natasha Woodard MRN: [id number] Date of Birth: 22-Sep-1941  Today's Date: 02/22/2023 OT Individual Time: [id number] OT Individual Time Calculation (min): 54 min   Today's Date: 02/22/2023 OT Individual Time: 1300-1329 OT Individual Time Calculation (min): 29 min  Short Term Goals: Week 1:  OT Short Term Goal 1 (Week 1): Pt will stand for grooming routine for up to 5 min with close S OT Short Term Goal 2 (Week 1): Pt will complete LB sponge bathing and dressing with CGA with AE sink side OT Short Term Goal 3 (Week 1): Pt will transfer on/off toilet with CGA with LRAD  Skilled Therapeutic Interventions/Progress Updates:     Pt received in bed with intermittent pain in wound vac site. Pt with many questions about POC, Mds, coordination of care, participating tx, wound vac healing and DC plans. Spent significant amount of time educating on DC/services often provided after DC and alerted care coordinator to come in to discuss from nursing perspective about the wound vac, healing and procedure  ADL: Pt completes ADL at overall CGA Level. Skilled interventions include: use of RW for all transfers at ambulatory level, reminders about waiting for OT for mobility as pt unable to manage the wound vac and ambulate, education about use of reacher for LB dressing to decrease pain in wound vac site. Set up for UB ADLs seated and MOD A for LB ADLs  sit to stand.  Pt left at end of session in w/c with exit alarm on, call light in reach and all needs met  Session 2: Pt received in bed with no pain reported  ADL: Pt completes toileting and ambulatory level transfer with RW wit supervision. Pt needing increased tie for rest breaks.   Therapeutic exercise Pt completes 3x1 min beach ball volley in unsupported sitting position with no weight # dowel rod for dynamic balance, postural control, BUE strengthening and endurance required for BADLs and  functional transfers. Kept ball volley below shoulder height  Pt left at end of session in w/c with exit alarm on, call light in reach and all needs met   Therapy Documentation Precautions:  Precautions Precautions: Fall, ICD/Pacemaker, Other (comment) Precaution Comments: wound VAC rt groin, LUE ICD placed 2/12 no lifting LUE past shoulder or L shld resistance exercise x6 weeks Restrictions Weight Bearing Restrictions: No Therapy/Group: Individual Therapy  Tonny Branch 02/22/2023, 6:49 AM

## 2023-02-22 NOTE — Progress Notes (Signed)
Physical Therapy Session Note  Patient Details  Name: Natasha Woodard MRN: [id number] Date of Birth: [birth year]  Today's Date: 02/22/2023 PT Individual Time: 1st Treatment Session: 1000-1115; 2nd Treatment Session: 1415-1500 PT Individual Time Calculation (min): 75 min; 45 min  Short Term Goals: Week 1:  PT Short Term Goal 1 (Week 1): = LTGs due to ELOS  Skilled Therapeutic Interventions/Progress Updates:  1st Treatment Session- Patient greeted sitting upright in wheelchair in room ordering her meals and agreeable to PT treatment session. Patient stood from wheelchair without the use of an AD and SBA- Patient then ambulated to/from her wheelchair and restroom with CGA. Patient was able to toilet with SBA for safety. Patient then sat EOB in order for RN to administer medication and therapist to don tennis shoes for time management. Patient then gait trained >150' from her room to rehab gym with RW and Supv- VC throughout for improved postural extension, forward gaze, stepping within the RW and decreased UE use.   Patient stood unsupported and performed x20 total alternating foot taps to 4" step with CGA/MinA for improved stability- VC for clearing her entire foot when stepping on and off the step.   Patient ambulated to/from mat table and Nustep without the use of an AD and CGA- Patient completed Nustep x10 minutes with B UE/LE on level 6. Patient required a 2 minute rest break halfway through exercise. Patient requesting to use the Nustep every day in order to increase B LE strength.   Patient gait trained >150' without UE support and CGA/MinA for improved stability- VC for increased R glute activation during R stance phase for improved stability. During gait trial, patient requesting to use the restroom- Patient ambulated to her room and toileted with SBA. Patient's brief noted to be slightly soiled so therapist donned clean brief. NT notified of both voiding instances and agreeable to  charting. Patient left sitting upright in wheelchair with chair alarm on, call bell within reach and all needs met.    2nd Treatment Session-  Patient greeted sitting upright in wheelchair in room and agreeable to PT treatment session. Patient requesting to use the restroom prior to heading to the rehab gym for therapy. Patient stood from wheelchair without the use of an AD and SBA- Patient then ambulated ~20' from her wheelchair to restroom with CGA for safety. Patient toileted and performed pericare with SBA. Patient then ambulated from her room to main rehab gym (>150') with RW and Supv- VC for improved R glute activation during stance phase, postural extension, and forward gaze.   Patient ascended/descended x16 steps with S HR and CGA for safety- VC for using a step-to pattern in order to improve overall endurance/activity tolerance. Patient alternated leading LE throughout stair mobility. Patient required an extended seated rest break after stair mobility.   Patient gait trained back to day room from main gym with RW and supv with same VC as above.   Patient trialed gait with rollator and SBA for safety- VC for locking brakes prior to standing/sitting with min recall noted. Patient with increased UE support on walker requiring VC for decreased UE use, improved postural extension and stepping within the frame of the walker. Patient required reminders for proper technique for locking the brakes. Will continue to work on functional mobility with rollator in order to improve overall independence and safety.   Patient left sitting upright in wheelchair in room with chair alarm on, call bell within reach, tray table in front and all needs  met- Patient educated on pressing the call button in order to have nursing staff assist with applying yeast infection cream.    Therapy Documentation Precautions:  Precautions Precautions: Fall, ICD/Pacemaker, Other (comment) Precaution Comments: wound VAC rt  groin, LUE ICD placed 2/12 no lifting LUE past shoulder or L shld resistance exercise x6 weeks Restrictions Weight Bearing Restrictions: No  Therapy/Group: Individual Therapy  Kyran Connaughton 02/22/2023, 7:50 AM

## 2023-02-22 NOTE — Progress Notes (Signed)
Met with patient this am. Discussed wound vac and she said that MD had already gone over the information.  Was concerned about ppl being able to see the wound vac. Informed that it will be much smaller; just a little larger than a cell phone if has to have when going home. Informed that wear over should like a bag and a small jacket will cover if it's still an issue. Informed that at this time we are not sure if will have [facility] manage or if will go to wound center.  Informed that may just be dressing changes but until she is closer to discharge we don't know what the plan will be.  Encouraged on working through therapies. Also discussed the interday for when going home. May be able to wash and reuse (will contact [employer] as they are a different color and texture). Also when home can clean with water and soap, dry well, and use dish cloths or pillow case and they can be washed daily.  Ordered new interday due to c/o odor. Also patient reported that bottom hurts specifically when in bed or sitting. Reports that this is new since being admitted to hospital. Reports that needs nurse to take a picture so that MD can see area. Informed her that I can do that.  Had charge who is a WTC take picture. Coccyx and sacrum are red and blanchable and largely associated with moisture and her congenital skin condition. Picture in epic.  She uses Vaseline at home on her skin for dryness/flakiness. Also let her know that team conference will be every Tuesday and will discuss discharge date, barriers to discharge, goals and progress. Went of binder and encouraged her to keep all paperwork that therapy gives her and I will provide education of things that have happened during her stay.  I informed her that will place education material in binder and she can review and nursing should go over any of the information she may  have questions about.  Patient very pleasant. Reports that does have diabetes and we are giving her insulin here. She  doesn't want to do finger sticks at home or use insulin. Explained that while in the hospital we monitor blood sugars and will go home doing the same thing she was doing prior to admission because she has a physician that is monitoring and knows her.  She reports after I said that she would taking her blood sugar BID that she only takes once a month.  Informed her to do what her doctor told her to do. Verified that checks her weights daily at home and I would provide a sheet for her to update her weights to keep in one place.  All questions answered, all needs met. Call bell in reach.

## 2023-02-23 LAB — GLUCOSE, CAPILLARY
Glucose-Capillary: 102 mg/dL — ABNORMAL HIGH (ref 70–99)
Glucose-Capillary: 112 mg/dL — ABNORMAL HIGH (ref 70–99)
Glucose-Capillary: 107 mg/dL — ABNORMAL HIGH (ref 70–99)
Glucose-Capillary: 123 mg/dL — ABNORMAL HIGH (ref 70–99)

## 2023-02-23 NOTE — Progress Notes (Signed)
PROGRESS NOTE   Subjective/Complaints:  No events overnight.  Patient sleeping in bed on a.m. evaluation.  Per nursing, wound care nursing changed vac this a.m., and surgery briefly stopped in.  Wound care nursing discussing adjustment of regimen for intertrigo, should not use Interdry dressing with antifungal creams.  She did discuss this with the patient already.  ROS: +pain at wound vac site - stable. +Wound vac odor-stable. Denies fevers, chills, N/V, abdominal pain, constipation, diarrhea, SOB, cough, chest pain, new weakness or paraesthesias.     Objective:   No results found. No results for input(s): "WBC", "HGB", "HCT", "PLT" in the last 72 hours.  No results for input(s): "NA", "K", "CL", "CO2", "GLUCOSE", "BUN", "CREATININE", "CALCIUM" in the last 72 hours.   Intake/Output Summary (Last 24 hours) at 02/23/2023 1305 Last data filed at 02/23/2023 [id number] Gross per 24 hour  Intake 355 ml  Output 10 ml  Net 345 ml         Physical Exam: Vital Signs Blood pressure 111/80, pulse 69, temperature 98.1 F (36.7 C), resp. rate 17, SpO2 100 %. Gen: no distress, normal appearing HENT:     Head: Normocephalic and atraumatic.     Ears:     Comments: + HOH bilaterally d/t cerumen impaction    Nose: Nose normal.     Mouth/Throat:     Mouth: Mucous membranes are moist.  Eyes:     Extraocular Movements: Extraocular movements intact.     Pupils: Pupils are equal, round, and reactive to light.  Cardiovascular:     Rate and Rhythm: Normal rate. Rhythm irregular.     Heart sounds: Murmur heard.  Pulmonary:     Effort: Pulmonary effort is normal. No respiratory distress.     Breath sounds: Normal breath sounds. No stridor. No wheezing.  Abdominal:     General: Bowel sounds are normal. There is no distension.     Palpations: Abdomen is soft.     Tenderness: There is no abdominal tenderness.  Musculoskeletal:        General:  Normal range of motion.     Cervical back: Normal range of motion and neck supple.     Right lower leg: No edema.     Left lower leg: No edema.  Skin:    Comments: + intertrigo under abdominal pannus and in groin, with chronic appearing discoloration; interdermal dressings  Vac in left groin packed well and sealed.  Wound with minimal serosanginous drainage into vac; continuous.  + Dry, flaking skin throughout. Poor turgor.  + buttocks/sacrum unstageable d/t scabbing - improved from MASD on 3/11 as below       Neurological:     Mental Status: She is alert.     Comments: Alert and oriented x 3.  From prior exams: Normal insight and awareness. Intact Memory. Normal language and speech. Cranial nerve exam unremarkable. HOH, UE 5/5. LE 3+ prox to 4/5 distally. Sensory exam normal for light touch and pain in all 4 limbs. No limb ataxia or cerebellar signs. No abnormal tone appreciated.    Psychiatric:        Mood and Affect: Mood normal.  Behavior: Behavior normal.    Assessment/Plan: 1. Functional deficits which require 3+ hours per day of interdisciplinary therapy in a comprehensive inpatient rehab setting. Physiatrist is providing close team supervision and 24 hour management of active medical problems listed below. Physiatrist and rehab team continue to assess barriers to discharge/monitor patient progress toward functional and medical goals  Care Tool:  Bathing    Body parts bathed by patient: Right arm, Left arm, Chest, Right upper leg, Left upper leg, Face   Body parts bathed by helper: Abdomen, Front perineal area, Buttocks, Right lower leg, Left lower leg     Bathing assist Assist Level: Maximal Assistance - Patient 24 - 49%     Upper Body Dressing/Undressing Upper body dressing   What is the patient wearing?: Pull over shirt    Upper body assist Assist Level: Minimal Assistance - Patient > 75%    Lower Body Dressing/Undressing Lower body dressing       What is the patient wearing?: Pants     Lower body assist Assist for lower body dressing: Maximal Assistance - Patient 25 - 49%     Toileting Toileting    Toileting assist Assist for toileting: Moderate Assistance - Patient 50 - 74%     Transfers Chair/bed transfer  Transfers assist     Chair/bed transfer assist level: Minimal Assistance - Patient > 75%     Locomotion Ambulation   Ambulation assist      Assist level: Minimal Assistance - Patient > 75% Assistive device: Hand held assist Max distance: 130'   Walk 10 feet activity   Assist     Assist level: Minimal Assistance - Patient > 75% Assistive device: Hand held assist   Walk 50 feet activity   Assist    Assist level: Minimal Assistance - Patient > 75% Assistive device: Hand held assist    Walk 150 feet activity   Assist Walk 150 feet activity did not occur: Safety/medical concerns         Walk 10 feet on uneven surface  activity   Assist     Assist level: Moderate Assistance - Patient - 50 - 74% Assistive device: Hand held assist   Wheelchair     Assist Is the patient using a wheelchair?: Yes Type of Wheelchair: Manual    Wheelchair assist level: Dependent - Patient 0% (will be primary ambulator)      Wheelchair 50 feet with 2 turns activity    Assist        Assist Level: Dependent - Patient 0%   Wheelchair 150 feet activity     Assist      Assist Level: Dependent - Patient 0%   Blood pressure 111/80, pulse 69, temperature 98.1 F (36.7 C), resp. rate 17, SpO2 100 %.    Medical Problem List and Plan: 1. Functional deficits secondary to debility after multiple medical issues after acute lateral wall MI and cardiogenic shock.              -course complicated by right femoral pseudoaneurysm which ultimately required I&D, washout as well as sartorius muscle flap and vac placement             -patient may not yet shower - will inquire with wound care  nursing/Dr. Virl Cagey regarding showering with vac clamped 3/14             -ELOS/Goals: 7-10 days, mod I to supervision goals DC goal 3/19   - 3/14: surgery eval this AM, awaiting instructions  for home DC wound care / vac  2.  Antithrombotics: -DVT/anticoagulation:  Pharmaceutical: Eliquis             -antiplatelet therapy: plavix 3. Pain Management: scheduled tylenol and tramadol ordered. This is working for her.             -lidocaine patches prn 4. Mood/Behavior/Sleep: monitor sleep patterns             -melatonin PRN for sleep             -antipsychotic agents: n/a 5. Neuropsych/cognition: This patient is capable of making decisions on her own behalf. 6. Skin/Wound Care/hx Ichthyosis: vac changes right groin Mondays and Thursday             -Add InterDry dressing to abdominal/inguinal fold to control moisture  - Vac changes per [facility]   - 3/13: Sacral MASD appears improving; continue barrier, prevention of moisture trapping  7. Fluids/Electrolytes/Nutrition: encourage appropriate PO inatke             -check labs Monday morning             -po intake has been sporadic             -protein/nutritional supps have been ordered for moderate protein deficient malnutrition  -  8. Acute on chronic systolic CHF/Acute lateral MI/PAF/VT arrest 2/7             -s/p DDD ICD on 2/12             -continue amiodarone '200mg'$  daily              -HR controlled, regular; normotentive; monitor             -volume mgt with demadex/aldactone             -cozaar 12.'5mg'$  daily    02/23/2023    6:06 AM 02/22/2023    8:21 PM 02/22/2023    5:34 PM  Vitals with BMI  Systolic [id number]  Diastolic 80 46 59  Pulse 69 69 70    9. Right femoral pseudoaneurysm             -wound care as above             -s/p sartorius rmuscle flap             -completed course of IV zosyn prior to IPR  10. AKI on CKD IIIb             -Baseline Cr 1.5 - stable, mild uptrend             -encouraged 6-8 glasses of water per  day             -avoid nephrotoxic meds  11. Acute on chronic anemia             -s/p iron infusion             -check cbc in am             -optimize nutritional status  12. Vaginal yeast             -topical clotrimazole for 6 more doses - ends Friday   - added external cream to internal; applicator as above  13. Cerumen impaction             -apparently attempts made to clear it             -outpt follow up  recommended  14. Intertriginous dermatitis: monostat ordered   - 3/14: Per WOCN, continue interdry dressings, CANNOT use antifungal creams with this. Monostat discontinued.   15. Insomnia: grounds pass ordered for sunlight exposure during the day.    LOS: 4 days A FACE TO [place] 02/23/2023, 1:05 PM

## 2023-02-23 NOTE — Progress Notes (Signed)
Occupational Therapy Session Note  Patient Details  Name: Natasha Woodard MRN: [id number] Date of Birth: 26-Mar-1941  Today's Date: 02/23/2023 OT Individual Time: [id number] OT Individual Time Calculation (min): 74 min    Short Term Goals: Week 1:  OT Short Term Goal 1 (Week 1): Pt will stand for grooming routine for up to 5 min with close S OT Short Term Goal 2 (Week 1): Pt will complete LB sponge bathing and dressing with CGA with AE sink side OT Short Term Goal 3 (Week 1): Pt will transfer on/off toilet with CGA with LRAD  Skilled Therapeutic Interventions/Progress Updates:    Pt greeted semi-reclined in bed awake and agreeable to OT treatment session. Pt reported need to go to the bathroom. Bed mobility completed with min A due to R LE groin pain from recent wound vac change. Pt ambulated to bathroom w/ RW and CGA. CGA to sit on commode, then continent void of bladder. Pt able to complete peri-care with CGA. Bathing/dressing tasks completed sit<>stand at the sink focus on standing balance/endurance with pt able to stand for ~ 10 minutes while performing BADL tasks. Pt ambulated to therapy gym with RW and close supervision. Pt stood on foam mat with min A to get balance initially, progressing to CGA. 3 sets of 10 UB there-ex using 2 lb dowel rod chest press and bicep curl. Balance strategies improved with repetition. Pt ambulated back to room in similar fashion and left sitting up in wc with alarm on, lunch set-up, and needs met.   Therapy Documentation Precautions:  Precautions Precautions: Fall, ICD/Pacemaker, Other (comment) Precaution Comments: wound VAC rt groin, LUE ICD placed 2/12 no lifting LUE past shoulder or L shld resistance exercise x6 weeks Restrictions Weight Bearing Restrictions: No General:   Vital Signs:   Pain: Pain Assessment Pain Score: 3  Groin area after wound vac change. Rest and repositioned for comfort.    Therapy/Group: Individual Therapy  Valma Cava 02/23/2023, 12:07 PM

## 2023-02-23 NOTE — Progress Notes (Signed)
Pt seen this morning. Smiling.  Much stronger than previous.  Appears to be progresing very well in CIR.   Appreciate WCON's help with VAC changes.   Broadus John MD

## 2023-02-23 NOTE — Consult Note (Addendum)
[facility] Nurse wound follow up Wound type: surgical  Measurement: 8 cms x 3 cms x 1.4 cms  Wound bed: 100% beefy red  Drainage (amount, consistency, odor) minimal sanguinous  Periwound: intact, pannus with Moisture Associated intertriginous Dermatitis (see below)   Dressing procedure/placement/frequency: Removed old NPWT dressing Cleansed wound with normal saline Periwound skin protected with skin barrier wipe; cut 2" barrier ring in half and placed in creases to assist with seal  Filled wound with  _1_ piece of black foam, Sealed NPWT dressing at 121m HG Patient received PO pain medication per bedside nurse prior to dressing change Patient tolerated procedure fair   ICD codes for  Irritant Dermatitis  L30.4  - Erythema intertrigo. Also used for abrasion of the hand, chafing of the skin, dermatitis due to sweating and friction, friction dermatitis, friction eczema, and genital/thigh intertrigo.   WOC recommends discontinuation of any creams or ointments and use of Inter Dry as directed below for pannus.    Measure and cut length of IPhil Dopp (Kellie Simmering#[phone] to fit in skin folds that have skin breakdown Tuck InterDry fabric into skin folds in a single layer, allow for 2 inches of overhang from skin edges to allow for wicking to occur May remove to bathe; dry area thoroughly and then tuck into affected areas again Do not apply any creams or ointments when using InterDry DO NOT THROW AWAY FOR 5 DAYS unless soiled with stool DO NOT [facility]product, this will inactivate the silver in the material  New sheet of Interdry should be applied after 5 days of use if patient continues to have skin breakdown  WOC nurse will continue to provide NPWT dressing changes Mondays and Thursday   due to the complexity of the dressing change.  Next dressing change Monday 02/27/2023. Supplies ordered for room.   Thank you,    Caleen Taaffe MSN, RN-BC, [employer]

## 2023-02-23 NOTE — Progress Notes (Signed)
Physical Therapy Session Note  Patient Details  Name: Natasha Woodard MRN: [id number] Date of Birth: February 08, 1941  Today's Date: 02/23/2023 PT Individual Time: 1300-1400 PT Individual Time Calculation (min): 60 min   Short Term Goals: Week 1:  PT Short Term Goal 1 (Week 1): = LTGs due to ELOS  Skilled Therapeutic Interventions/Progress Updates: Pt presents sitting in w/c and agreeable to therapy.  Pt wheeled to elevators and outside.  Pt amb multiple trials w/ rollator and supervision, verbal cues for visual scanning.  Pt amb up to 125' outdoors.  Education continued on need for rollator use for improved safety, w/ expected return to amb w/o AD.  Pt requires cues for brakes as well as positioning of rollator for use of seat.  Pt amb indoors w/ rollator and supervision x 150' x 2.  Pt remained sitting in w/c w/ seat alarm on and all needs in reach.     Therapy Documentation Precautions:  Precautions Precautions: Fall, ICD/Pacemaker, Other (comment) Precaution Comments: wound VAC rt groin, LUE ICD placed 2/12 no lifting LUE past shoulder or L shld resistance exercise x6 weeks Restrictions Weight Bearing Restrictions: No General:   Vital Signs:   Pain:8/10      Therapy/Group: Individual Therapy  Ladoris Gene 02/23/2023, 2:02 PM

## 2023-02-23 NOTE — Progress Notes (Signed)
Physical Therapy Session Note  Patient Details  Name: Natasha Woodard MRN: [id number] Date of Birth: 09/04/1941  Today's Date: 02/23/2023 PT Individual Time: 0800-0900 PT Individual Time Calculation (min): 60 min   Short Term Goals: Week 1:  PT Short Term Goal 1 (Week 1): = LTGs due to ELOS  Skilled Therapeutic Interventions/Progress Updates:  Patient greeted supine in bed and agreeable to PT treatment session. Patient transitioned to sitting EOB with supv- Patient then stood from the EOB with RW and ambulated to/from the bathroom with supv. Patient with continent void with RN notified and agreeable to charting. Patient was able to perform pericare independently. While sitting EOB, patient was able to doff gown and don clean tank top and shirt- Therapist donned clean brief, pants, socks and shoes for time management. Patient then ambulated to the sink where she stood to brush her teeth with supv. Patient gait trained from her room to 4N with rollator and supv for safety- Patient reporting rollator is challenging to push so therapist exchanged old rollator for new rollator with significant improvements noted in overall ease of gait. While ambulating to 4N, patient took a seated rest break on the rollator with VC for proper sequencing. With the new rollator, patient was able to ambulate from 4N to day room without any rest breaks. Patient then gait trained >150' back to her room with rollator and supv. Patient educated on need for AD at home in order to ensure safety/stability and improved endurance with functional mobility- Patient reported understanding, however resistive to thought of using an AD. Patient left sitting upright in wheelchair in room with chair alarm on, call bell within reach and all needs met.    Therapy Documentation Precautions:  Precautions Precautions: Fall, ICD/Pacemaker, Other (comment) Precaution Comments: wound VAC rt groin, LUE ICD placed 2/12 no lifting LUE past  shoulder or L shld resistance exercise x6 weeks Restrictions Weight Bearing Restrictions: No   Therapy/Group: Individual Therapy  Oronde Hallenbeck 02/23/2023, 8:02 AM

## 2023-02-24 LAB — CBC WITH DIFFERENTIAL/PLATELET
Abs Immature Granulocytes: 0.03 10*3/uL (ref 0.00–0.07)
Basophils Absolute: 0 10*3/uL (ref 0.0–0.1)
Basophils Relative: 1 %
Eosinophils Absolute: 0.2 10*3/uL (ref 0.0–0.5)
Eosinophils Relative: 3 %
HCT: 40.3 % (ref 36.0–46.0)
Hemoglobin: 12.4 g/dL (ref 12.0–15.0)
Immature Granulocytes: 1 %
Lymphocytes Relative: 22 %
Lymphs Abs: 1.4 10*3/uL (ref 0.7–4.0)
MCH: 31.3 pg (ref 26.0–34.0)
MCHC: 30.8 g/dL (ref 30.0–36.0)
MCV: 101.8 fL — ABNORMAL HIGH (ref 80.0–100.0)
Monocytes Absolute: 0.7 10*3/uL (ref 0.1–1.0)
Monocytes Relative: 11 %
Neutro Abs: 4.1 10*3/uL (ref 1.7–7.7)
Neutrophils Relative %: 62 %
Platelets: 268 10*3/uL (ref 150–400)
RBC: 3.96 MIL/uL (ref 3.87–5.11)
RDW: 17.4 % — ABNORMAL HIGH (ref 11.5–15.5)
WBC: 6.5 10*3/uL (ref 4.0–10.5)
nRBC: 0 % (ref 0.0–0.2)

## 2023-02-24 LAB — BASIC METABOLIC PANEL
Anion gap: 12 (ref 5–15)
BUN: 57 mg/dL — ABNORMAL HIGH (ref 8–23)
CO2: 24 mmol/L (ref 22–32)
Calcium: 9.5 mg/dL (ref 8.9–10.3)
Chloride: 102 mmol/L (ref 98–111)
Creatinine, Ser: 1.54 mg/dL — ABNORMAL HIGH (ref 0.44–1.00)
GFR, Estimated: 34 mL/min — ABNORMAL LOW (ref >60)
Glucose, Bld: 107 mg/dL — ABNORMAL HIGH (ref 70–99)
Potassium: 4.2 mmol/L (ref 3.5–5.1)
Sodium: 138 mmol/L (ref 135–145)

## 2023-02-24 LAB — GLUCOSE, CAPILLARY
Glucose-Capillary: 100 mg/dL — ABNORMAL HIGH (ref 70–99)
Glucose-Capillary: 105 mg/dL — ABNORMAL HIGH (ref 70–99)
Glucose-Capillary: 129 mg/dL — ABNORMAL HIGH (ref 70–99)
Glucose-Capillary: 107 mg/dL — ABNORMAL HIGH (ref 70–99)

## 2023-02-24 MED ORDER — TRAMADOL HCL 50 MG PO TABS
25.0000 mg | ORAL_TABLET | Freq: Four times a day (QID) | ORAL | Status: DC | PRN
  Administered 2023-02-24: 25 mg via ORAL
  Administered 2023-02-25: 50 mg via ORAL
  Filled 2023-02-24 (×3): qty 1

## 2023-02-24 MED ORDER — TRAMADOL HCL 50 MG PO TABS
25.0000 mg | ORAL_TABLET | Freq: Four times a day (QID) | ORAL | Status: DC | PRN
  Administered 2023-02-24: 25 mg via ORAL
  Filled 2023-02-24: qty 1

## 2023-02-24 NOTE — Progress Notes (Signed)
Patient continues to have medication questions. Writer explained every medication and why it was ordered. Patient then asked me to spell every medication for her. Patient then wanted the cartridges to every medication. Writer threw Tylenol cartridge in garbage prior to patients request.  Patient was upset that she had more medications then the  number of cartridge. Again explained I had already thrown the Tylenol cartridges in the garbage. Patient then wanted each medication matched to each cartridge. Per patients request she would like to speak to her PA or Dr regarding medications. Elliot Dally PA notified of patients request.

## 2023-02-24 NOTE — Progress Notes (Signed)
Patient was unaware of her tramadol reduction in strength. States she is used to 50mg  and has a lot of pain.

## 2023-02-24 NOTE — Progress Notes (Signed)
Patient has many questions regarding her medications especially her Tylenol and Tramadol. Educated patient that her Tylenol is a scheduled medication and her Tramadol is a as needed medication. Patient kept saying ok I will take my Tylenol and Tramadol at 8:30 PM before I go to bed. Continued to educated patient that she could have her Tramadol at 830, however her Tylenol is a scheduled. Patient finally understood after 30 minutes of education.

## 2023-02-24 NOTE — Progress Notes (Signed)
PROGRESS NOTE   Subjective/Complaints:  No events overnight.  Discussed with patient Dx of intertrigo, need for interdry without creams to avoid moisture trapping. Attempted to explain to patient multiple times, without apparent understanding. She states her PCP usually gives her a pill that "clears everything up"; on chart review she gets periodic Diflucan PO for vaginal candida. Patient does feel lotrimin Tx for vaginal years has been current helpful.    ROS: +pain at wound vac site - stable. +Wound vac odor-stable. Denies fevers, chills, N/V, abdominal pain, constipation, diarrhea, SOB, cough, chest pain, new weakness or paraesthesias.     Objective:   No results found. Recent Labs    02/24/23 1237  WBC 6.5  HGB 12.4  HCT 40.3  PLT 268    Recent Labs    02/24/23 1237  NA 138  K 4.2  CL 102  CO2 24  GLUCOSE 107*  BUN 57*  CREATININE 1.54*  CALCIUM 9.5     Intake/Output Summary (Last 24 hours) at 02/24/2023 1626 Last data filed at 02/24/2023 1541 Gross per 24 hour  Intake 649 ml  Output --  Net 649 ml         Physical Exam: Vital Signs Blood pressure (!) 112/53, pulse 71, temperature 97.7 F (36.5 C), resp. rate 17, SpO2 100 %. Gen: no distress, normal appearing HENT:     Head: Normocephalic and atraumatic.     Ears:     Comments: + HOH bilaterally d/t cerumen impaction    Nose: Nose normal.     Mouth/Throat:     Mouth: Mucous membranes are moist.  Eyes:     Extraocular Movements: Extraocular movements intact.     Pupils: Pupils are equal, round, and reactive to light.  Cardiovascular:     Rate and Rhythm: Normal rate. Rhythm irregular.     Heart sounds: Murmur heard.  Pulmonary:     Effort: Pulmonary effort is normal. No respiratory distress.     Breath sounds: Normal breath sounds. No stridor. No wheezing.  Abdominal:     General: Bowel sounds are normal. There is no distension.      Palpations: Abdomen is soft.     Tenderness: There is no abdominal tenderness.  Musculoskeletal:        General: Normal range of motion.     Cervical back: Normal range of motion and neck supple.     Right lower leg: No edema.     Left lower leg: No edema.  Skin:    Comments: + intertrigo underbreasts,  abdominal pannus and in groin, with chronic appearing discoloration; interdermal dressings absent Vac in left groin packed well and sealed.  Wound with moderate serosanginous drainage into vac; continuous.  + Dry, flaking skin throughout. Poor turgor.  + buttocks/sacrum unstageable d/t scabbing - improved from MASD on 3/11 as below       Neurological:     Mental Status: She is alert.     Comments: Alert and oriented x 3.  + difficulty with understanding wound care instructions Cranial nerve exam unremarkable. HOH, anitgravity and against resistance all 4 extremities, Sensory exam normal for light touch and pain in all 4  limbs. No limb ataxia or cerebellar signs. No abnormal tone appreciated.    Psychiatric:        Mood and Affect: Mood normal.        Behavior: Behavior normal.    Assessment/Plan: 1. Functional deficits which require 3+ hours per day of interdisciplinary therapy in a comprehensive inpatient rehab setting. Physiatrist is providing close team supervision and 24 hour management of active medical problems listed below. Physiatrist and rehab team continue to assess barriers to discharge/monitor patient progress toward functional and medical goals  Care Tool:  Bathing    Body parts bathed by patient: Right arm, Left arm, Chest, Right upper leg, Left upper leg, Face, Buttocks, Front perineal area, Left lower leg, Right lower leg, Abdomen   Body parts bathed by helper: Abdomen, Front perineal area, Buttocks, Right lower leg, Left lower leg     Bathing assist Assist Level: Minimal Assistance - Patient > 75%     Upper Body Dressing/Undressing Upper body dressing    What is the patient wearing?: Pull over shirt    Upper body assist Assist Level: Supervision/Verbal cueing    Lower Body Dressing/Undressing Lower body dressing      What is the patient wearing?: Underwear/pull up, Pants     Lower body assist Assist for lower body dressing: Contact Guard/Touching assist     Toileting Toileting    Toileting assist Assist for toileting: Minimal Assistance - Patient > 75%     Transfers Chair/bed transfer  Transfers assist     Chair/bed transfer assist level: Contact Guard/Touching assist     Locomotion Ambulation   Ambulation assist      Assist level: Supervision/Verbal cueing Assistive device: Rollator Max distance: 150   Walk 10 feet activity   Assist     Assist level: Supervision/Verbal cueing Assistive device: Rollator   Walk 50 feet activity   Assist    Assist level: Supervision/Verbal cueing Assistive device: Rollator    Walk 150 feet activity   Assist Walk 150 feet activity did not occur: Safety/medical concerns  Assist level: Supervision/Verbal cueing Assistive device: Rollator    Walk 10 feet on uneven surface  activity   Assist     Assist level: Supervision/Verbal cueing Assistive device: Rollator   Wheelchair     Assist Is the patient using a wheelchair?: Yes Type of Wheelchair: Manual    Wheelchair assist level: Dependent - Patient 0% (will be primary ambulator)      Wheelchair 50 feet with 2 turns activity    Assist        Assist Level: Dependent - Patient 0%   Wheelchair 150 feet activity     Assist      Assist Level: Dependent - Patient 0%   Blood pressure (!) 112/53, pulse 71, temperature 97.7 F (36.5 C), resp. rate 17, SpO2 100 %.    Medical Problem List and Plan: 1. Functional deficits secondary to debility after multiple medical issues after acute lateral wall MI and cardiogenic shock.              -course complicated by right femoral  pseudoaneurysm which ultimately required I&D, washout as well as sartorius muscle flap and vac placement             -patient may not yet shower - per WOCN cannot shower with clamped tubing d.t location, may shower during vac changes only.              -ELOS/Goals: 7-10 days, mod I to  supervision goals DC goal 3/19   - 3/14: surgery eval this AM, no changes, likely DC with wound vac  2.  Antithrombotics: -DVT/anticoagulation:  Pharmaceutical: Eliquis             -antiplatelet therapy: plavix 3. Pain Management: scheduled tylenol and tramadol ordered. This is working for her.             -lidocaine patches prn 4. Mood/Behavior/Sleep: monitor sleep patterns             -melatonin PRN for sleep             -antipsychotic agents: n/a 5. Neuropsych/cognition: This patient is capable of making decisions on her own behalf. 6. Skin/Wound Care/hx Ichthyosis: vac changes right groin Mondays and Thursday             -Add InterDry dressing to abdominal/inguinal fold to control moisture  - Vac changes per [facility]   - 3/13: Sacral MASD appears improving; continue barrier, prevention of moisture trapping  7. Fluids/Electrolytes/Nutrition: encourage appropriate PO inatke             -check labs Monday morning             -po intake has been sporadic             -protein/nutritional supps have been ordered for moderate protein deficient malnutrition  -  8. Acute on chronic systolic CHF/Acute lateral MI/PAF/VT arrest 2/7             -s/p DDD ICD on 2/12             -continue amiodarone 200mg  daily              -HR controlled, regular; normotentive; monitor             -volume mgt with demadex/aldactone             -cozaar 12.5mg  daily    02/24/2023    2:06 PM 02/24/2023    5:09 AM 02/23/2023    7:36 PM  Vitals with BMI  Systolic [id number]  Diastolic 53 59 54  Pulse 71 70 70    9. Right femoral pseudoaneurysm             -wound care as above             -s/p sartorius rmuscle flap              -completed course of IV zosyn prior to IPR  10. AKI on CKD IIIb             -Baseline Cr 1.5 - stable, mild uptrend             -encouraged 6-8 glasses of water per day             -avoid nephrotoxic meds  11. Acute on chronic anemia             -s/p iron infusion             -check cbc in am             -optimize nutritional status  12. Vaginal yeast             -topical clotrimazole for 6 more doses - ends Friday   - added external cream to internal; applicator as above  13. Cerumen impaction             -apparently attempts made to clear it             -  outpt follow up recommended  14. Intertriginous dermatitis: monostat ordered   - 3/14: Per WOCN, continue interdry dressings, CANNOT use antifungal creams with this. Monostat discontinued.   15. Insomnia: grounds pass ordered for sunlight exposure during the day.    LOS: 5 days A FACE TO FACE EVALUATION WAS PERFORMED  Gertie Gowda 02/24/2023, 4:26 PM

## 2023-02-24 NOTE — Progress Notes (Signed)
Patient ID: Natasha Woodard, female   DOB: [birth year], 82 y.o.   MRN: [id number]  SW met with pt in room to answer questions related to continued therapies, and discussed will confirm wound vac is needed at discharge. SW received FMLA forms. Scheduled family education for Monday 2pm-4pm.   Loralee Pacas, MSW, [place] Office: [phone] Cell: [phone] Fax: [phone]

## 2023-02-24 NOTE — Progress Notes (Addendum)
Patient ID: Natasha Woodard, female   DOB: 1941-11-20, 82 y.o.   MRN: [id number]  PT recommended rollator. SW ordered with [employer] via parachute.   SW faxed FMLA forms to [phone].  SW provided pt with completed FMLA forms. Pt grandson Marchia Bond present. SW explained wound vac and HHA, and SW will follow-up once everything is in place.   SW sent new wound vac order to [id number].   SW sent HHPT/OT/SN referral to [facility] and waiting on follow-up.   Loralee Pacas, MSW, [place] Office: [phone] Cell: [phone] Fax: [phone]

## 2023-02-24 NOTE — Progress Notes (Signed)
Occupational Therapy Session Note  Patient Details  Name: Natasha Woodard MRN: [id number] Date of Birth: 1941/05/23  Today's Date: 02/24/2023 Session 1 OT Individual Time: [id number] Individual Time Calculation (min): 73 min   Session 2 OT Individual Time: 1315-1400 OT Individual Time Calculation (min): 45 min    Short Term Goals: Week 1:  OT Short Term Goal 1 (Week 1): Pt will stand for grooming routine for up to 5 min with close S OT Short Term Goal 2 (Week 1): Pt will complete LB sponge bathing and dressing with CGA with AE sink side OT Short Term Goal 3 (Week 1): Pt will transfer on/off toilet with CGA with LRAD  Skilled Therapeutic Interventions/Progress Updates:  Session 1   Pt greeted in bathroom with nurse tech, handofff to OT. Educated pt on RW positioning when accessing cabinet and dresser to collect clothing. Pt collected clothing w/ supervision. Overall supervision for ambulation with a RW throughout session. Pt completed bathing/dressing tasks with overall supervision. Addressed standing endurance with standing grooming tasks. OT assisted with placing powder under breasts and panus for moisture control. Functional ambulation to gym with rollator and supervision. Standing on foam mat with CGA progressing to close supervision. Completed small peg board task while standing for 5 minutes on foam. Pt ambulated back to room and left seated in wc with alarm on, call bell in reach, and needs met.   Session 2 Pt greeted sittting in wc with grandson present. Pt talking to therapist and grandson about feeling as though it is unreasonable for her to go home with a wound vac and when she is not walking on her own. Educated pt on time needed for wound heeling and encouraged her. Discussed that just because she is walking with a rollator, she is still walking without assistance. Pt's grandson was also very encouraging. Pt ambulated to therapy apartment with supervision with rollator. We  practiced tub bench transfer in simulated home environment with min A to lift R LE over ledge. She then ambulated to therapy dayroom and we worked on standing balance/endurance with cores strengtnening using ball toss and oblique twist, 3 sets of 10. Pt ambulated back to room in similar fashion and left seated in wc with grandson present and needs met.   Therapy Documentation Precautions:  Precautions Precautions: Fall, ICD/Pacemaker, Other (comment) Precaution Comments: wound VAC rt groin, LUE ICD placed 2/12 no lifting LUE past shoulder or L shld resistance exercise x6 weeks Restrictions Weight Bearing Restrictions: No General:   Vital Signs: Therapy Vitals Temp: 97.7 F (36.5 C) Pulse Rate: 71 Resp: 17 BP: (!) 112/53 Patient Position (if appropriate): Sitting Oxygen Therapy SpO2: 100 % O2 Device: Room Air Pain: Pain Assessment Pain Scale: 0-10 Pain Score: 0-No pain Faces Pain Scale: No hurtPt reports pain is managed at this time.   Therapy/Group: Individual Therapy  Valma Cava 02/24/2023, 2:27 PM

## 2023-02-24 NOTE — Progress Notes (Signed)
Physical Therapy Session Note  Patient Details  Name: Natasha Woodard MRN: [id number] Date of Birth: 07-27-1941  Today's Date: 02/24/2023 PT Individual Time: 1110-1210 PT Individual Time Calculation (min): 60 min   Short Term Goals: Week 1:  PT Short Term Goal 1 (Week 1): = LTGs due to ELOS  Skilled Therapeutic Interventions/Progress Updates:  Patient greeted supine in bed and agreeable to PT treatment session, however reporting concern regarding her wound vac and wounds under the folds of her stomach and breast. Patient stated she does not feel comfortable with discharging home on Tuesday secondary to all the wound care that needs to be performed and her expectation that she would not be using an AD at discharge. Therapist spent an extensive amount of time educating her that she and her family will be provided with education regarding her functional mobility, as well as he wound care- patient reported that her spouse would not be able to assist at home so further discussed potentially nursing care within the home to assist with the wound vac dressing changes. Patient continued to voice frustration that she can't stay until everything is healed and until she is able to ambulate without an AD- Therapist educated her that therapy does not end once she goes home and that she will receive continued therapy at home or in outpatient and to have her goal with them be ambulating without an AD. Educated her on the importance of remaining safe when discharging home. Patient remained visibly frustrated throughout the remainder of treatment session.   Patient transitioned from supine to sitting EOB with supv. While sitting EOB, therapist donned tennis shoes for time management. Patient ambulated from her room to the ortho gym and back to main rehab gym (>300') with rollator and supv- VC throughout for postural extension, forward gaze and stepping within the frame of the walker.   Patient tasked with weaving  between x10 cones, x2 trials without the use of an AD and CGA/MinA for improved stability. VC throughout for increased R glute activation during stance phase to improve overall hip stability. Patient required increased time to complete and frequently reached out for railing in hallway or other objects for support.   Patient performed sit/stand x10 without UE support and SBA- VC for increased R lateral weight shift with good effort and improvements noted.   Patient performed lateral stepping x10' in each direction, x4 trials total with CGA for safety- VC throughout for improved R glute activation and increased stance time on R LE, as well as improved postural extension and forward gaze into the mirror.   Patient gait trained back to her room with rollator and Supv. Patient requesting to use the restroom upon returning to her room and was able to complete all toileting, pericare and clothing management with supv. Patient left sitting upright in wheelchair with tray table in front, call bell within reach and all needs met. NT present taking blood sugar prior to lunch.    Therapy Documentation Precautions:  Precautions Precautions: Fall, ICD/Pacemaker, Other (comment) Precaution Comments: wound VAC rt groin, LUE ICD placed 2/12 no lifting LUE past shoulder or L shld resistance exercise x6 weeks Restrictions Weight Bearing Restrictions: No  Therapy/Group: Individual Therapy  Natasha Woodard 02/24/2023, 7:58 AM

## 2023-02-25 LAB — GLUCOSE, CAPILLARY
Glucose-Capillary: 133 mg/dL — ABNORMAL HIGH (ref 70–99)
Glucose-Capillary: 140 mg/dL — ABNORMAL HIGH (ref 70–99)
Glucose-Capillary: 148 mg/dL — ABNORMAL HIGH (ref 70–99)
Glucose-Capillary: 98 mg/dL (ref 70–99)

## 2023-02-25 MED ORDER — ACETAMINOPHEN 500 MG PO TABS
1000.0000 mg | ORAL_TABLET | Freq: Three times a day (TID) | ORAL | Status: DC
  Administered 2023-02-25: 1000 mg via ORAL
  Filled 2023-02-25: qty 2

## 2023-02-25 MED ORDER — ACETAMINOPHEN 500 MG PO TABS
1000.0000 mg | ORAL_TABLET | Freq: Two times a day (BID) | ORAL | Status: DC
  Administered 2023-02-26 – 2023-02-28 (×5): 1000 mg via ORAL
  Filled 2023-02-25 (×6): qty 2

## 2023-02-25 MED ORDER — TRAMADOL HCL 50 MG PO TABS
50.0000 mg | ORAL_TABLET | Freq: Two times a day (BID) | ORAL | Status: DC
  Administered 2023-02-25 – 2023-02-28 (×6): 50 mg via ORAL
  Filled 2023-02-25 (×7): qty 1

## 2023-02-25 MED ORDER — TRAMADOL HCL 50 MG PO TABS: 50.0000 mg | ORAL_TABLET | Freq: Three times a day (TID) | ORAL | Status: DC | PRN

## 2023-02-25 NOTE — Progress Notes (Addendum)
PROGRESS NOTE   Subjective/Complaints:  Patient evaluated with husband at bedside.  No events overnight, did have some concerns regarding timing of tramadol and Tylenol.  She states that, generally, she has best effects when they are taken together in the morning around 7 AM and in the evening around 7 PM.  Also discussed multiple concerns regarding monitoring and care of the wound VAC on discharge home.    Patient's husband expresses considerable concern regarding monitoring of the wound, timeline for healing, odor, treatment of the back, and who the physician is who is ultimately responsible for the wound.  He expresses skepticism that if physician has looked at the actual wound since she has been inpatient, even with reassurance that I have personally looked at the wound and Dr. Unk Lightning  evaluated her earlier this week, and wishes to speak to a surgeon face-to-face regarding its appearance and care.  He states a phone call is not acceptable, and he will be in the room all day today and tomorrow.   ROS: +pain at wound vac site - stable. +Wound vac odor-stable. Denies fevers, chills, N/V, abdominal pain, constipation, diarrhea, SOB, cough, chest pain, new weakness or paraesthesias.     Objective:   No results found. Recent Labs    02/24/23 1237  WBC 6.5  HGB 12.4  HCT 40.3  PLT 268    Recent Labs    02/24/23 1237  NA 138  K 4.2  CL 102  CO2 24  GLUCOSE 107*  BUN 57*  CREATININE 1.54*  CALCIUM 9.5     Intake/Output Summary (Last 24 hours) at 02/25/2023 2206 Last data filed at 02/25/2023 1544 Gross per 24 hour  Intake 480 ml  Output --  Net 480 ml         Physical Exam: Vital Signs Blood pressure (!) 107/57, pulse 70, temperature 98.2 F (36.8 C), temperature source Oral, resp. rate 18, SpO2 99 %. Gen: no distress, normal appearing HENT:     Head: Normocephalic and atraumatic.     Ears:     Comments: + HOH  bilaterally d/t cerumen impaction    Nose: Nose normal.     Mouth/Throat:     Mouth: Mucous membranes are moist.  Eyes:     Extraocular Movements: Extraocular movements intact.     Pupils: Pupils are equal, round, and reactive to light.  Cardiovascular:     Rate and Rhythm: Normal rate. Rhythm irregular.     Heart sounds: Murmur heard.  Pulmonary:     Effort: Pulmonary effort is normal. No respiratory distress.     Breath sounds: Normal breath sounds. No stridor. No wheezing.  Abdominal:     General: Bowel sounds are normal. There is no distension.     Palpations: Abdomen is soft.     Tenderness: There is no abdominal tenderness.  Musculoskeletal:     Moving all 4 limbs antigravity and against resistance  Skin:    Comments: + intertrigo under breasts,  abdominal pannus and in groin, with chronic appearing discoloration; interdermal dressings absent + Vac in left groin packed well and sealed.  Mild odor. Wound with moderate serosanginous drainage into vac; continuous. [place]  nonerythematous and well defined. No change in appearance from prior exams.   + Dry, flaking skin throughout. Poor turgor.  + buttocks/sacrum unstageable d/t scabbing - improved from MASD on 3/11 as below      Neurological:     Mental Status: She is alert.     Comments: Alert and oriented x 3. Cranial nerve exam unremarkable.  anitgravity and against resistance all 4 extremities, Sensory exam normal for light touch and pain in all 4 limbs. No limb ataxia or cerebellar signs. No abnormal tone appreciated.    Psychiatric:        Mood and Affect: Mood normal.        Behavior: Behavior normal.    Assessment/Plan: 1. Functional deficits which require 3+ hours per day of interdisciplinary therapy in a comprehensive inpatient rehab setting. Physiatrist is providing close team supervision and 24 hour management of active medical problems listed below. Physiatrist and rehab team continue to assess barriers to  discharge/monitor patient progress toward functional and medical goals  Care Tool:  Bathing    Body parts bathed by patient: Right arm, Left arm, Chest, Right upper leg, Left upper leg, Face, Buttocks, Front perineal area, Left lower leg, Right lower leg, Abdomen   Body parts bathed by helper: Abdomen, Front perineal area, Buttocks, Right lower leg, Left lower leg     Bathing assist Assist Level: Minimal Assistance - Patient > 75%     Upper Body Dressing/Undressing Upper body dressing   What is the patient wearing?: Pull over shirt    Upper body assist Assist Level: Supervision/Verbal cueing    Lower Body Dressing/Undressing Lower body dressing      What is the patient wearing?: Underwear/pull up, Pants     Lower body assist Assist for lower body dressing: Contact Guard/Touching assist     Toileting Toileting    Toileting assist Assist for toileting: Minimal Assistance - Patient > 75%     Transfers Chair/bed transfer  Transfers assist     Chair/bed transfer assist level: Contact Guard/Touching assist     Locomotion Ambulation   Ambulation assist      Assist level: Supervision/Verbal cueing Assistive device: Rollator Max distance: 150   Walk 10 feet activity   Assist     Assist level: Supervision/Verbal cueing Assistive device: Rollator   Walk 50 feet activity   Assist    Assist level: Supervision/Verbal cueing Assistive device: Rollator    Walk 150 feet activity   Assist Walk 150 feet activity did not occur: Safety/medical concerns  Assist level: Supervision/Verbal cueing Assistive device: Rollator    Walk 10 feet on uneven surface  activity   Assist     Assist level: Supervision/Verbal cueing Assistive device: Rollator   Wheelchair     Assist Is the patient using a wheelchair?: Yes Type of Wheelchair: Manual    Wheelchair assist level: Dependent - Patient 0% (will be primary ambulator)      Wheelchair 50 feet  with 2 turns activity    Assist        Assist Level: Dependent - Patient 0%   Wheelchair 150 feet activity     Assist      Assist Level: Dependent - Patient 0%   Blood pressure (!) 107/57, pulse 70, temperature 98.2 F (36.8 C), temperature source Oral, resp. rate 18, SpO2 99 %.    Medical Problem List and Plan: 1. Functional deficits secondary to debility after multiple medical issues after acute lateral wall MI and cardiogenic shock.              -  course complicated by right femoral pseudoaneurysm which ultimately required I&D, washout as well as sartorius muscle flap and vac placement             -patient may not yet shower - per WOCN cannot shower with clamped tubing d.t location, may shower during vac changes only.              -ELOS/Goals: 7-10 days, mod I to supervision goals DC goal 3/19   - 3/14: surgery eval this AM, no changes, likely DC with wound vac   - 3/16: Husband requesting face to face eval with surgery prior to patient discharge Tuesday, will touch base with their team regarding his concerns on wound appearance and care.   2.  Antithrombotics: -DVT/anticoagulation:  Pharmaceutical: Eliquis             -antiplatelet therapy: plavix 3. Pain Management: scheduled tylenol and tramadol ordered. This is working for her.             -lidocaine patches prn  3/16: Tramadol and Tylenol retimed to BID, qith PRN tramadol ordered for 30 minutes prior to wound vac changes\   4. Mood/Behavior/Sleep: monitor sleep patterns             -melatonin PRN for sleep             -antipsychotic agents: n/a 5. Neuropsych/cognition: This patient is capable of making decisions on her own behalf. 6. Skin/Wound Care/hx Ichthyosis: vac changes right groin Mondays and Thursday             -Add InterDry dressing to abdominal/inguinal fold to control moisture  - Vac changes per [facility] - no changes, stable appearance   - 3/13: Sacral MASD appears improving; continue barrier, prevention  of moisture trapping  7. Fluids/Electrolytes/Nutrition: encourage appropriate PO inatke             -check labs Monday morning             -po intake has been sporadic             -protein/nutritional supps have been ordered for moderate protein deficient malnutrition  -  8. Acute on chronic systolic CHF/Acute lateral MI/PAF/VT arrest 2/7             -s/p DDD ICD on 2/12             -continue amiodarone 200mg  daily              -HR controlled, regular; normotentive; monitor             -volume mgt with demadex/aldactone             -cozaar 12.5mg  daily    02/25/2023    7:33 PM 02/25/2023   12:56 PM 02/25/2023    5:46 AM  Vitals with BMI  Systolic [id number]  Diastolic 57 62 58  Pulse 70 70 69    9. Right femoral pseudoaneurysm             -wound care as above             -s/p sartorius rmuscle flap             -completed course of IV zosyn prior to IPR  10. AKI on CKD IIIb             -Baseline Cr 1.5 - stable, mild uptrend             -encouraged 6-8 glasses of  water per day             -avoid nephrotoxic meds  11. Acute on chronic anemia             -s/p iron infusion             -check cbc in am             -optimize nutritional status  12. Vaginal yeast             -topical clotrimazole for 6 more doses - finished 3/15   - added external cream to internal; applicator as above  13. Cerumen impaction             -apparently attempts made to clear it             -outpt follow up recommended  14. Intertriginous dermatitis: monostat ordered   - 3/14: Per WOCN, continue interdry dressings, CANNOT use antifungal creams with this. Monostat discontinued.   15. Insomnia: grounds pass ordered for sunlight exposure during the day.    LOS: 6 days A FACE TO [place] 02/25/2023, 10:06 PM

## 2023-02-25 NOTE — Progress Notes (Signed)
Patient does not want tylenol to be scheduled at 0430 while she is sleeping.

## 2023-02-25 NOTE — Progress Notes (Signed)
Occupational Therapy Session Note  Patient Details  Name: Natasha Woodard MRN: [id number] Date of Birth: 07-18-1941  Today's Date: 02/25/2023 OT Individual Time: [id number]; 12:48-1303 OT Individual Time Calculation (min): 46 min    Short Term Goals: Week 1:  OT Short Term Goal 1 (Week 1): Pt will stand for grooming routine for up to 5 min with close S OT Short Term Goal 2 (Week 1): Pt will complete LB sponge bathing and dressing with CGA with AE sink side OT Short Term Goal 3 (Week 1): Pt will transfer on/off toilet with CGA with LRAD  Skilled Therapeutic Interventions/Progress Updates: Patient seen for ADL training. Patient with SBA/ CGA to transition from supine to sitting EOB. Ambulated to the toilet with CGA using RW. Patient able to manage clothing and perform pericare without assist. Ambulated to sink for grooming/ Hygiene standing at the sink. Bathing performed seated in the w/c at the sink. Patient needing min assist for LE bathing and to apply cream to back of legs. Patient dressed with set up assist for UB and CGA for LB. Patient left to sit up in w/c to eat breakfast. Good participation. Patient requesting second treatment to work on ambulation and RUE weakness and discomfort.  Second Treatment: Patient received sitting up in w/c. Reported having a sore tail bone. Educated on wt shifts for pressure relief. Assisted patient to stand to walker with close SBA. Walked from room around nurses station and through the gym before returning to her room without a rest break. Once back in her room assisted with RUE [place] mobilization and AAROM working to relieve discomfort and stiffness. Patient tolerated second treatment session well. Continue with skilled OT POC to further improve independence with self care and functional activity tolerance.      Therapy Documentation Precautions:  Precautions Precautions: Fall, ICD/Pacemaker, Other (comment) Precaution Comments: wound VAC rt groin, LUE ICD  placed 2/12 no lifting LUE past shoulder or L shld resistance exercise x6 weeks Restrictions Weight Bearing Restrictions: No General:   Vital Signs: Therapy Vitals Temp: 98.7 F (37.1 C) Pulse Rate: 70 Resp: 18 BP: 115/62 Patient Position (if appropriate): Sitting Oxygen Therapy SpO2: 100 % O2 Device: Room Air Pain:Reported stiffness and pain in R shoulder- aching    Therapy/Group: Individual Therapy  Hermina Barters 02/25/2023, 3:25 PM

## 2023-02-26 LAB — GLUCOSE, CAPILLARY
Glucose-Capillary: 96 mg/dL (ref 70–99)
Glucose-Capillary: 169 mg/dL — ABNORMAL HIGH (ref 70–99)
Glucose-Capillary: 128 mg/dL — ABNORMAL HIGH (ref 70–99)
Glucose-Capillary: 166 mg/dL — ABNORMAL HIGH (ref 70–99)

## 2023-02-26 NOTE — Progress Notes (Signed)
PROGRESS NOTE   Subjective/Complaints:  Patient evaluated with husband at bedside.  No events overnight. Tramadol timing improved.  Discussed family request to speak face to face with vascular surgery before discharge, Dr. Donzetta Matters aware, Dr. Unk Lightning to come by tomorrow.   Patient concerned regarding whether she will have home vac prior to discharge and an opportunity to practice with it. Physician unsure at this time but will d/w therapies for Monday.    ROS: +pain at wound vac site - stable. +Wound vac odor-stable. Denies fevers, chills, N/V, abdominal pain, constipation, diarrhea, SOB, cough, chest pain, new weakness or paraesthesias.     Objective:   No results found. Recent Labs    02/24/23 1237  WBC 6.5  HGB 12.4  HCT 40.3  PLT 268    Recent Labs    02/24/23 1237  NA 138  K 4.2  CL 102  CO2 24  GLUCOSE 107*  BUN 57*  CREATININE 1.54*  CALCIUM 9.5     Intake/Output Summary (Last 24 hours) at 02/26/2023 1951 Last data filed at 02/26/2023 1900 Gross per 24 hour  Intake 360 ml  Output 15 ml  Net 345 ml         Physical Exam: Vital Signs Blood pressure 108/64, pulse 70, temperature 98 F (36.7 C), temperature source Oral, resp. rate 17, SpO2 (!) 67 %. Gen: no distress, normal appearing HENT:     Head: Normocephalic and atraumatic.     Ears:     Comments: + HOH bilaterally d/t cerumen impaction    Nose: Nose normal.     Mouth/Throat:     Mouth: Mucous membranes are moist.  Eyes:     Extraocular Movements: Extraocular movements intact.     Pupils: Pupils are equal, round, and reactive to light.  Cardiovascular:     Rate and Rhythm: Normal rate. Rhythm irregular.     Heart sounds: Murmur heard.  Pulmonary:     Effort: Pulmonary effort is normal. No respiratory distress.     Breath sounds: Normal breath sounds. No stridor. No wheezing.  Abdominal:     General: Bowel sounds are normal. There is no  distension.     Palpations: Abdomen is soft.     Tenderness: There is no abdominal tenderness.  Musculoskeletal:     Moving all 4 limbs antigravity and against resistance  Skin:    Comments: + intertrigo under breasts,  abdominal pannus and in groin, with chronic appearing discoloration; interdermal dressings + Vac in left groin -  scant serosanginous drainage into vac; continuous. + Dry, flaking skin throughout. Poor turgor.  + buttocks/sacrum unstageable d/t scabbing - improved from MASD on 3/11 as below      Neurological:     Mental Status: She is alert.     Comments: Alert and oriented x 3. No apparent deficits.    Mood and Affect: Mood normal.        Behavior: Behavior normal.    Assessment/Plan: 1. Functional deficits which require 3+ hours per day of interdisciplinary therapy in a comprehensive inpatient rehab setting. Physiatrist is providing close team supervision and 24 hour management of active medical problems listed below. Physiatrist and  rehab team continue to assess barriers to discharge/monitor patient progress toward functional and medical goals  Care Tool:  Bathing    Body parts bathed by patient: Right arm, Left arm, Chest, Right upper leg, Left upper leg, Face, Buttocks, Front perineal area, Left lower leg, Right lower leg, Abdomen   Body parts bathed by helper: Abdomen, Front perineal area, Buttocks, Right lower leg, Left lower leg     Bathing assist Assist Level: Minimal Assistance - Patient > 75%     Upper Body Dressing/Undressing Upper body dressing   What is the patient wearing?: Pull over shirt    Upper body assist Assist Level: Supervision/Verbal cueing    Lower Body Dressing/Undressing Lower body dressing      What is the patient wearing?: Underwear/pull up, Pants     Lower body assist Assist for lower body dressing: Contact Guard/Touching assist     Toileting Toileting    Toileting assist Assist for toileting: Minimal Assistance  - Patient > 75%     Transfers Chair/bed transfer  Transfers assist     Chair/bed transfer assist level: Contact Guard/Touching assist     Locomotion Ambulation   Ambulation assist      Assist level: Supervision/Verbal cueing Assistive device: Rollator Max distance: 150   Walk 10 feet activity   Assist     Assist level: Supervision/Verbal cueing Assistive device: Rollator   Walk 50 feet activity   Assist    Assist level: Supervision/Verbal cueing Assistive device: Rollator    Walk 150 feet activity   Assist Walk 150 feet activity did not occur: Safety/medical concerns  Assist level: Supervision/Verbal cueing Assistive device: Rollator    Walk 10 feet on uneven surface  activity   Assist     Assist level: Supervision/Verbal cueing Assistive device: Rollator   Wheelchair     Assist Is the patient using a wheelchair?: Yes Type of Wheelchair: Manual    Wheelchair assist level: Dependent - Patient 0% (will be primary ambulator)      Wheelchair 50 feet with 2 turns activity    Assist        Assist Level: Dependent - Patient 0%   Wheelchair 150 feet activity     Assist      Assist Level: Dependent - Patient 0%   Blood pressure 108/64, pulse 70, temperature 98 F (36.7 C), temperature source Oral, resp. rate 17, SpO2 (!) 67 %.    Medical Problem List and Plan: 1. Functional deficits secondary to debility after multiple medical issues after acute lateral wall MI and cardiogenic shock.              -course complicated by right femoral pseudoaneurysm which ultimately required I&D, washout as well as sartorius muscle flap and vac placement             -patient may not yet shower - per WOCN cannot shower with clamped tubing d.t location, may shower during vac changes only.              -ELOS/Goals: 7-10 days, mod I to supervision goals DC goal 3/19   - 3/14: surgery eval this AM, no changes, likely DC with wound vac   - 3/16:  Husband requesting face to face eval with surgery prior to patient discharge Tuesday, will touch base with their team regarding his concerns on wound appearance and care - Team aware, Dr. Virl Cagey to stop by Monday  2.  Antithrombotics: -DVT/anticoagulation:  Pharmaceutical: Eliquis             -  antiplatelet therapy: plavix 3. Pain Management: scheduled tylenol and tramadol ordered. This is working for her.             -lidocaine patches prn  3/16: Tramadol and Tylenol retimed to BID, qith PRN tramadol ordered for 30 minutes prior to wound vac changes - improved   4. Mood/Behavior/Sleep: monitor sleep patterns             -melatonin PRN for sleep             -antipsychotic agents: n/a 5. Neuropsych/cognition: This patient is capable of making decisions on her own behalf. 6. Skin/Wound Care/hx Ichthyosis: vac changes right groin Mondays and Thursday             -Add InterDry dressing to abdominal/inguinal fold to control moisture  - Vac changes per [facility] - no changes, stable appearance   - 3/13: Sacral MASD appears improving; continue barrier, prevention of moisture trapping  7. Fluids/Electrolytes/Nutrition: encourage appropriate PO inatke             -check labs Monday morning             -po intake has been sporadic             -protein/nutritional supps have been ordered for moderate protein deficient malnutrition   8. Acute on chronic systolic CHF/Acute lateral MI/PAF/VT arrest 2/7             -s/p DDD ICD on 2/12             -continue amiodarone 200mg  daily              -HR controlled, regular; normotentive; monitor             -volume mgt with demadex/aldactone             -cozaar 12.5mg  daily    02/26/2023   12:58 PM 02/26/2023    5:15 AM 02/25/2023    7:33 PM  Vitals with BMI  Systolic [id number]  Diastolic 64 60 57  Pulse 70 70 70    9. Right femoral pseudoaneurysm             -wound care as above             -s/p sartorius rmuscle flap             -completed course of  IV zosyn prior to IPR  10. AKI on CKD IIIb             -Baseline Cr 1.5 - stable, mild uptrend,              -encouraged 6-8 glasses of water per day             -avoid nephrotoxic meds  11. Acute on chronic anemia             -s/p iron infusion             -check cbc in am             -optimize nutritional status  12. Vaginal yeast - improved             -topical clotrimazole for 6 more doses - finished 3/15   - added external cream to internal; applicator as above  13. Cerumen impaction             -apparently attempts made to clear it             -  outpt follow up recommended  14. Intertriginous dermatitis: ongoing   - 3/14: Per WOCN, continue interdry dressings, CANNOT use antifungal creams with this. Monostat discontinued.    - 3/17: Significant improvement with powders and interdry per patient; monitor  15. Insomnia: grounds pass ordered for sunlight exposure during the day.    LOS: 7 days A FACE TO FACE EVALUATION WAS PERFORMED  Gertie Gowda 02/26/2023, 7:51 PM

## 2023-02-27 DIAGNOSIS — N1832 Chronic kidney disease, stage 3b: Secondary | ICD-10-CM

## 2023-02-27 DIAGNOSIS — D649 Anemia, unspecified: Secondary | ICD-10-CM

## 2023-02-27 DIAGNOSIS — I480 Paroxysmal atrial fibrillation: Secondary | ICD-10-CM

## 2023-02-27 LAB — CBC WITH DIFFERENTIAL/PLATELET
Abs Immature Granulocytes: 0.01 10*3/uL (ref 0.00–0.07)
Basophils Absolute: 0.1 10*3/uL (ref 0.0–0.1)
Basophils Relative: 1 %
Eosinophils Absolute: 0.3 10*3/uL (ref 0.0–0.5)
Eosinophils Relative: 6 %
HCT: 40 % (ref 36.0–46.0)
Hemoglobin: 12.1 g/dL (ref 12.0–15.0)
Immature Granulocytes: 0 %
Lymphocytes Relative: 34 %
Lymphs Abs: 1.5 10*3/uL (ref 0.7–4.0)
MCH: 30.3 pg (ref 26.0–34.0)
MCHC: 30.3 g/dL (ref 30.0–36.0)
MCV: 100 fL (ref 80.0–100.0)
Monocytes Absolute: 0.5 10*3/uL (ref 0.1–1.0)
Monocytes Relative: 11 %
Neutro Abs: 2.2 10*3/uL (ref 1.7–7.7)
Neutrophils Relative %: 48 %
Platelets: 253 10*3/uL (ref 150–400)
RBC: 4 MIL/uL (ref 3.87–5.11)
RDW: 16.6 % — ABNORMAL HIGH (ref 11.5–15.5)
WBC: 4.6 10*3/uL (ref 4.0–10.5)
nRBC: 0 % (ref 0.0–0.2)

## 2023-02-27 LAB — BASIC METABOLIC PANEL
Anion gap: 14 (ref 5–15)
BUN: 51 mg/dL — ABNORMAL HIGH (ref 8–23)
CO2: 28 mmol/L (ref 22–32)
Calcium: 9.8 mg/dL (ref 8.9–10.3)
Chloride: 100 mmol/L (ref 98–111)
Creatinine, Ser: 1.32 mg/dL — ABNORMAL HIGH (ref 0.44–1.00)
GFR, Estimated: 40 mL/min — ABNORMAL LOW (ref >60)
Glucose, Bld: 106 mg/dL — ABNORMAL HIGH (ref 70–99)
Potassium: 4.3 mmol/L (ref 3.5–5.1)
Sodium: 142 mmol/L (ref 135–145)

## 2023-02-27 LAB — GLUCOSE, CAPILLARY
Glucose-Capillary: 99 mg/dL (ref 70–99)
Glucose-Capillary: 180 mg/dL — ABNORMAL HIGH (ref 70–99)
Glucose-Capillary: 97 mg/dL (ref 70–99)
Glucose-Capillary: 180 mg/dL — ABNORMAL HIGH (ref 70–99)

## 2023-02-27 NOTE — Progress Notes (Signed)
Patient and family member seen by PA form vascular surgery, discussed progress of wound.   Patient given scheduled pain meds before therapy and informed that if she is still in pain to call before wound dressing change.   Took patient to bathroom as well for a BM and assisted with food tray after. Call bell within reach, bed alarm on, and other safety measures are implemented.

## 2023-02-27 NOTE — Progress Notes (Signed)
Occupational Therapy Session Note  Patient Details  Name: Natasha Woodard MRN: [id number] Date of Birth: [birth year]  Today's Date: 02/27/2023 OT Individual Time: 1016-1101 OT Individual Time Calculation (min): 45 min    Short Term Goals: Week 1:  OT Short Term Goal 1 (Week 1): Pt will stand for grooming routine for up to 5 min with close S OT Short Term Goal 2 (Week 1): Pt will complete LB sponge bathing and dressing with CGA with AE sink side OT Short Term Goal 3 (Week 1): Pt will transfer on/off toilet with CGA with LRAD  Skilled Therapeutic Interventions/Progress Updates:   Pt seen for components of discharge planning reassessment and training. Pt just returned from PT and was completing a snack upon OT arrival w/c level. Pt requested oral care including flossing and OT provided S for pt to amb with new personal rollator to sink side. Pt able to stand with dist S for over 5 min to complete task. Returned to w/c and discussed with OT plan for bathroom access safety upon discharge. Pt stated "I can't shower for a while but I decided to use the stall shower when I can." OT transported pt w/c level for time mngt to demo apt space. OT managed wound vac to rollator but then pt able to navigate space, perform SLS in/out of simulated shower frame and perform transfer to shower seat with S. Once back in room, BIMS conducted 15/15. Pt left w/c level with chair pad alarm, needs, and nurse call button in reach. Secure chat to primary OT for care coordination for discharge planning and status.   Therapy Documentation Precautions:  Precautions Precautions: Fall, ICD/Pacemaker, Other (comment) Precaution Comments: wound VAC rt groin, LUE ICD placed 2/12 no lifting LUE past shoulder or L shld resistance exercise x6 weeks Restrictions Weight Bearing Restrictions: No     Therapy/Group: Individual Therapy  Barnabas Lister 02/27/2023, 7:44 AM

## 2023-02-27 NOTE — Progress Notes (Signed)
Patient c/o of groin irritation as she was taking a bath. Nurse applied powder and PA made aware that patient requested for something stronger.

## 2023-02-27 NOTE — Progress Notes (Signed)
PROGRESS NOTE   Subjective/Complaints:  Pt asks about kidney function this AM. No additional concerns.    ROS: +pain at wound vac site - stable. +Wound vac odor-stable. Denies fevers, chills, N/V, abdominal pain, constipation, diarrhea, SOB, cough, chest pain, vision changes, new weakness or paraesthesias.     Objective:   No results found. Recent Labs    02/27/23 0553  WBC 4.6  HGB 12.1  HCT 40.0  PLT 253    Recent Labs    02/27/23 0553  NA 142  K 4.3  CL 100  CO2 28  GLUCOSE 106*  BUN 51*  CREATININE 1.32*  CALCIUM 9.8     Intake/Output Summary (Last 24 hours) at 02/27/2023 1320 Last data filed at 02/27/2023 1315 Gross per 24 hour  Intake 357 ml  Output --  Net 357 ml         Physical Exam: Vital Signs Blood pressure (!) 108/56, pulse 70, temperature 97.7 F (36.5 C), temperature source Oral, resp. rate 17, SpO2 100 %. Gen: no distress, normal appearing, seen at bedside HENT:     Head: Normocephalic and atraumatic.     Ears:     Comments: + HOH bilaterally d/t cerumen impaction    Nose: Nose normal.     Mouth/Throat:     Mouth: Mucous membranes are moist.  Eyes:     Extraocular Movements: Extraocular movements intact.     Pupils: Pupils are equal, round, and reactive to light.  Cardiovascular:     Rate and Rhythm: Normal rate. Rhythm irregular.     Heart sounds: Murmur heard.  Pulmonary:     Effort: Pulmonary effort is normal. No respiratory distress.     Breath sounds: Normal breath sounds. No stridor. No wheezing.  Abdominal:     General: Bowel sounds are normal. There is no distension.     Palpations: Abdomen is soft.     Tenderness: There is no abdominal tenderness.  Musculoskeletal:     Moving all 4 limbs antigravity and against resistance  Skin:    Comments: + intertrigo under breasts,  abdominal pannus and in groin, with chronic appearing discoloration; interdermal dressings +  Vac in left groin -  scant serosanginous drainage into vac; continuous. + Dry, flaking skin throughout. Poor turgor.  + buttocks/sacrum unstageable d/t scabbing - improved from MASD on 3/11 as below      Neurological:     Mental Status: She is alert.     Comments: Alert and awake.  No apparent deficits.    Mood and Affect: A little flat affect    Behavior: Behavior normal.    Assessment/Plan: 1. Functional deficits which require 3+ hours per day of interdisciplinary therapy in a comprehensive inpatient rehab setting. Physiatrist is providing close team supervision and 24 hour management of active medical problems listed below. Physiatrist and rehab team continue to assess barriers to discharge/monitor patient progress toward functional and medical goals  Care Tool:  Bathing    Body parts bathed by patient: Right arm, Left arm, Chest, Right upper leg, Left upper leg, Face, Buttocks, Front perineal area, Left lower leg, Right lower leg, Abdomen   Body parts bathed by  helper: Abdomen, Front perineal area, Buttocks, Right lower leg, Left lower leg     Bathing assist Assist Level: Minimal Assistance - Patient > 75%     Upper Body Dressing/Undressing Upper body dressing   What is the patient wearing?: Pull over shirt    Upper body assist Assist Level: Supervision/Verbal cueing    Lower Body Dressing/Undressing Lower body dressing      What is the patient wearing?: Underwear/pull up, Pants     Lower body assist Assist for lower body dressing: Contact Guard/Touching assist     Toileting Toileting    Toileting assist Assist for toileting: Minimal Assistance - Patient > 75%     Transfers Chair/bed transfer  Transfers assist     Chair/bed transfer assist level: Independent with assistive device Chair/bed transfer assistive device: Programmer, multimedia   Ambulation assist      Assist level: Independent with assistive device Assistive device:  Rollator Max distance: 150 ft   Walk 10 feet activity   Assist     Assist level: Independent with assistive device Assistive device: Rollator   Walk 50 feet activity   Assist    Assist level: Independent with assistive device Assistive device: Rollator    Walk 150 feet activity   Assist Walk 150 feet activity did not occur: Safety/medical concerns  Assist level: Independent with assistive device Assistive device: Rollator    Walk 10 feet on uneven surface  activity   Assist     Assist level: Independent with assistive device Assistive device: Rollator   Wheelchair     Assist Is the patient using a wheelchair?: No Type of Wheelchair: Manual    Wheelchair assist level: Dependent - Patient 0% (will be primary ambulator)      Wheelchair 50 feet with 2 turns activity    Assist        Assist Level: Dependent - Patient 0%   Wheelchair 150 feet activity     Assist      Assist Level: Dependent - Patient 0%   Blood pressure (!) 108/56, pulse 70, temperature 97.7 F (36.5 C), temperature source Oral, resp. rate 17, SpO2 100 %.    Medical Problem List and Plan: 1. Functional deficits secondary to debility after multiple medical issues after acute lateral wall MI and cardiogenic shock.              -course complicated by right femoral pseudoaneurysm which ultimately required I&D, washout as well as sartorius muscle flap and vac placement             -patient may not yet shower - per WOCN cannot shower with clamped tubing d.t location, may shower during vac changes only.              -ELOS/Goals: 7-10 days, mod I to supervision goals DC goal 3/19   - 3/14: surgery eval this AM, no changes, likely DC with wound vac   - 3/16: Husband requesting face to face eval with surgery prior to patient discharge Tuesday, will touch base with their team regarding his concerns on wound appearance and care - Team aware, Dr. Virl Cagey to stop by Monday  3/18 Social  work looking into options for HHA- limited options and she will need this for wound vac care  2.  Antithrombotics: -DVT/anticoagulation:  Pharmaceutical: Eliquis             -antiplatelet therapy: plavix 3. Pain Management: scheduled tylenol and tramadol ordered. This is working for her.             -  lidocaine patches prn  3/16: Tramadol and Tylenol retimed to BID, qith PRN tramadol ordered for 30 minutes prior to wound vac changes - improved   4. Mood/Behavior/Sleep: monitor sleep patterns             -melatonin PRN for sleep             -antipsychotic agents: n/a 5. Neuropsych/cognition: This patient is capable of making decisions on her own behalf. 6. Skin/Wound Care/hx Ichthyosis: vac changes right groin Mondays and Thursday             -Add InterDry dressing to abdominal/inguinal fold to control moisture  - Vac changes per [facility] - no changes, stable appearance   - 3/13: Sacral MASD appears improving; continue barrier, prevention of moisture trapping  7. Fluids/Electrolytes/Nutrition: encourage appropriate PO inatke             -check labs Monday morning             -po intake has been sporadic             -protein/nutritional supps have been ordered for moderate protein deficient malnutrition   8. Acute on chronic systolic CHF/Acute lateral MI/PAF/VT arrest 2/7             -s/p DDD ICD on 2/12             -continue amiodarone 200mg  daily              -HR controlled, regular; normotentive; monitor             -volume mgt with demadex/aldactone             -cozaar 12.5mg  daily  3/18 HR stable, continue current regimen    02/27/2023    1:14 PM 02/27/2023    5:29 AM 02/26/2023    7:53 PM  Vitals with BMI  Systolic [id number]  Diastolic 56 58 79  Pulse 70 68 71    9. Right femoral pseudoaneurysm             -wound care as above             -s/p sartorius rmuscle flap             -completed course of IV zosyn prior to IPR  10. AKI on CKD IIIb             -Baseline Cr 1.5 -  stable, mild uptrend,              -encouraged 6-8 glasses of water per day             -avoid nephrotoxic meds  -3/18 Cr down to 1.32- discussed with patient  11. Acute on chronic anemia             -s/p iron infusion             -check cbc in am             -optimize nutritional status  -3/18 HGB up to 12.1  12. Vaginal yeast - improved             -topical clotrimazole for 6 more doses - finished 3/15   - added external cream to internal; applicator as above  13. Cerumen impaction             -apparently attempts made to clear it             -outpt follow up recommended  14. Intertriginous dermatitis: ongoing   - 3/14: Per WOCN, continue interdry dressings, CANNOT use antifungal creams with this. Monostat discontinued.    - 3/17: Significant improvement with powders and interdry per patient; monitor  15. Insomnia: grounds pass ordered for sunlight exposure during the day.    LOS: 8 days A FACE TO FACE EVALUATION WAS PERFORMED  Jennye Boroughs 02/27/2023, 1:20 PM

## 2023-02-27 NOTE — Consult Note (Signed)
[place] Nurse wound follow up Wound type: surgical  Measurement: 8 cms x 2 cms x 1.4 cms  Wound bed: 100% beefy red  Drainage (amount, consistency, odor) minimal sanguinous  Periwound: some erythema to pannus and groin d/t MASD.  Interdry ordered (see previous note).  This RN applied skin prep to area that drape comes in contact with  Dressing procedure/placement/frequency: Removed old NPWT dressing Cleansed wound with normal saline Periwound skin protected with skin barrier wipe   Filled wound with  __1_ piece of black foam  Sealed NPWT dressing at 164mm HG Patient received PO pain medication per bedside nurse prior to dressing change Patient tolerated procedure well  WOC nurse will continue to provide NPWT dressing changes M/Thurs due to the complexity of the dressing change. Patient states she is due to be discharged Tuesday 3/19, if patient remains inpatient WOC will change NPWT dressing Thursday 03/02/2023.  Supplies in room.   Thank you,    Lopaka Karge MSN, RN-BC, [employer]

## 2023-02-27 NOTE — Progress Notes (Signed)
Occupational Therapy Session Note  Patient Details  Name: Natasha Woodard MRN: [id number] Date of Birth: 10/30/1941  Today's Date: 02/27/2023 OT Individual Time: [id number] OT Individual Time Calculation (min): 60 min    Short Term Goals: Week 1:  OT Short Term Goal 1 (Week 1): Pt will stand for grooming routine for up to 5 min with close S OT Short Term Goal 2 (Week 1): Pt will complete LB sponge bathing and dressing with CGA with AE sink side OT Short Term Goal 3 (Week 1): Pt will transfer on/off toilet with CGA with LRAD  Skilled Therapeutic Interventions/Progress Updates:    Pt greeted sitting in wc and agreeable to OT treatment session. Pt reported need to go to the bathroom. Functional ambulation from wc to bathroom using rollator and supervision.  Pt with continent void of bladder and completed 3/3 toileting tasks w/ supervision. Pt's husband not present yet for family education. Pt called him and he stated he was on his way. Pt ambulated to therapy gym w/ rollator and supervision. OT provided pt with home exercise program for UB using yellow theraband. Pt requested to work on endurance on NuStep. Pt completed 10 minutes on NuStep on level 3. Spouse then entered gym and we ambulated back to the room w/ rollator. Education provided regarding safe BADL performance in home environment, home modifications, DME needs, energy conservation techniques, and safety awareness. Spouse with questions regarding wound vac. Deferred questions to case manager. Pt left seated EOB with spouse present and needs met.      Therapy Documentation Precautions:  Precautions Precautions: Fall, ICD/Pacemaker, Other (comment) Precaution Comments: wound VAC rt groin, LUE ICD placed 2/12 no lifting LUE past shoulder or L shld resistance exercise x6 weeks Restrictions Weight Bearing Restrictions: No Pain:  Denies pain   Therapy/Group: Individual Therapy  Valma Cava 02/27/2023, 2:01 PM

## 2023-02-27 NOTE — Progress Notes (Signed)
Physical Therapy Session Note  Patient Details  Name: Natasha Woodard MRN: [id number] Date of Birth: [birth year]  Today's Date: 02/27/2023 PT Individual Time: 0915-1015 PT Individual Time Calculation (min): 60 min   Short Term Goals: Week 1:  PT Short Term Goal 1 (Week 1): = LTGs due to ELOS  Skilled Therapeutic Interventions/Progress Updates:    pt received seated EOB bed and agreeable to therapy. No complaint of pain. Pt does report feeling lethargic and wanted to be sure she received lasix. Confirmed with nsg. Pt requested to get dressed and did so with supervision. Assist to manage brief and tie shoes for energy conservation. Session focused on d/c assessments as documented below. Pt ambulated with rollator at mod I level throughout session. Pt performed car transfer with supervision/cueing. Pt then navigated ramp and mulch with rollator, mod I. Pt instructed on using rollator as a seat, parking against wall with brakes locked. Pt demoes later in session without cues. Pt performed stairs x 12. Pt initially uses 2 hand rails, but states part of her stairs only have one rail, so performed x 4 with 1 rail per home environment. Pt also states she normally descends backward. Pt demoes this x 4 with good safety, but this therapist recommends discussing with primary. Pt returned to room after session and remained in w/c, was left with all needs in reach and alarm active.   Therapy Documentation Precautions:  Precautions Precautions: Fall, ICD/Pacemaker, Other (comment) Precaution Comments: wound VAC rt groin, LUE ICD placed 2/12 no lifting LUE past shoulder or L shld resistance exercise x6 weeks Restrictions Weight Bearing Restrictions: No General:       Therapy/Group: Individual Therapy  Mickel Fuchs 02/27/2023, 12:58 PM

## 2023-02-27 NOTE — Progress Notes (Signed)
Patient ID: CADENCE LAMAY, female   DOB: 12-28-1940, 82 y.o.   MRN: [id number]  SW waiting on follow-up with Angie/[facility] about referral.  *declined referral due to staffing.   SW sent out referral to various HHAs and waiting on follow-up.   SW spoke with Erica/[facility] [phone]) to discuss referral. Reports they do not have staffing at this time. Likely to have nursing in a few weeks, and no OT at this time.   SW spoke with Vicki/[facility] with [facility] ([phone]/f:[phone]) to discuss referral. Declined as not able to accommodate for care needs.   SW waiting on updates from Tracy/[employer] about wound vac approval.   SW spoke with Dr. Virl Cagey to discuss challenges with obtaining HH, and if wound vac changes are possible in the clinic. Can only see pt once a week for changes. SW informed medical team.   SW spoke with Jody/[facility] to discuss pt care needs. Reports no appointments until 4/18 and would only be able to see once a week.   SW updated medical team on above, and waiting on updates from [facility]   1450-SW met with pt and pt husband in room to discuss above challenges with obtaining HH, and waiting for insurance to approve wound vac. Pt and husband aware there   1520-SW spoke with Tracy/[employer] about wound vac, and reports insurance approved.   SW provided pt with wound vac. Pt and husband concerned about no HHA being willing to accept. They both understand physicians will work on an appropriate solution.   Declined HHAs Calvin/[facility] Ashley/[facility]- declined due to staffing Angie[facility] Erica[facility] Cheryl/[facility] Cory/[facility] Kelly/[facility] Carolyn/[facility]- no [place] contract  Loralee Pacas, MSW, [place] Office: [phone] Cell: [phone] Fax: [phone]

## 2023-02-27 NOTE — Progress Notes (Signed)
Physical Therapy Discharge Summary  Patient Details  Name: ANNICE NIBBE MRN: [id number] Date of Birth: [birth year]  Date of Discharge from PT service:February 27, 2023  {CHL IP REHAB PT TIME CALCULATION:[id number]}   Patient has met {NUMBERS 0-12:18577} of 7 long term goals due to improved activity tolerance, improved balance, increased strength, and decreased pain.  Patient to discharge at an ambulatory level Supervision/ModI with the use of a rollator.  Patient's care partner is independent to provide the necessary  supervision  assistance at discharge.  Reasons goals not met: NA  Recommendation:  Patient will benefit from ongoing skilled PT services in outpatient setting to continue to advance safe functional mobility, address ongoing impairments in dynamic stability, global strengthening, endurance/activity tolerance and minimize fall risk.  Equipment: Rollator  Reasons for discharge: treatment goals met and discharge from hospital  Patient/family agrees with progress made and goals achieved: Yes  PT Discharge Precautions/Restrictions   Vital Signs Therapy Vitals Temp: 98.3 F (36.8 C) Temp Source: Oral Pulse Rate: 68 Resp: 18 BP: (!) 114/58 Patient Position (if appropriate): Lying Oxygen Therapy SpO2: 96 % O2 Device: Room Air Pain   Pain Interference   Vision/Perception     Cognition   Sensation   Motor     Mobility   Locomotion     Trunk/Postural Assessment     Balance   Extremity Assessment            Melenie Minniear 02/27/2023, 8:02 AM

## 2023-02-27 NOTE — Progress Notes (Signed)
Physical Therapy Session Note  Patient Details  Name: Natasha Woodard MRN: [id number] Date of Birth: 1941/10/27  Today's Date: 02/27/2023 PT Individual Time: 1500-1537 PT Individual Time Calculation (min): 37 min   Short Term Goals: Week 1:  PT Short Term Goal 1 (Week 1): = LTGs due to ELOS  Skilled Therapeutic Interventions/Progress Updates:  Patient greeted sitting EOB with spouse, [name], present and agreeable to PT treatment session. Patient scheduled for family training in order to ensure safety with functional mobility. Patient ambulated to/from her room and various rehab gyms with [place]. Patient performed a car transfer and ascended/descended a low grade ramp all with ModI. Patient then ascended/descended x16 steps with S HR and supv for safety- Patient at times attempting to descend the stairs backward, however with education from therapist and spouse patient was able to descend the stair forward with appropriate safety. Therapist answered all questions throughout treatment session and provided education regarding safety, etc. Patient left sitting on the EOB with spouse present and all needs met.    Therapy Documentation Precautions:  Precautions Precautions: Fall, ICD/Pacemaker, Other (comment) Precaution Comments: wound VAC rt groin, LUE ICD placed 2/12 no lifting LUE past shoulder or L shld resistance exercise x6 weeks Restrictions Weight Bearing Restrictions: No   Therapy/Group: Individual Therapy  Shervon Kerwin 02/27/2023, 7:48 AM

## 2023-02-27 NOTE — Progress Notes (Addendum)
  Progress Note    02/27/2023 8:19 AM * No surgery found *  Subjective:  No complaints this morning   Vitals:   02/26/23 1953 02/27/23 0529  BP: 117/79 (!) 114/58  Pulse: 71 68  Resp: 17 18  Temp: 98.1 F (36.7 C) 98.3 F (36.8 C)  SpO2: 94% 96%   Physical Exam: Lungs:  non labored Incisions:  R groin wound with healthy appearing wound bed; 5cm L x 2.5cm W x 2cm D Abdomen:  soft, NT, ND Neurologic: A&O  CBC    Component Value Date/Time   WBC 4.6 02/27/2023 0553   RBC 4.00 02/27/2023 0553   HGB 12.1 02/27/2023 0553   HGB 14.2 10/09/2020 0935   HCT 40.0 02/27/2023 0553   HCT 44.9 10/09/2020 0935   PLT 253 02/27/2023 0553   PLT 379 10/09/2020 0935   MCV 100.0 02/27/2023 0553   MCV 91 10/09/2020 0935   MCH 30.3 02/27/2023 0553   MCHC 30.3 02/27/2023 0553   RDW 16.6 (H) 02/27/2023 0553   RDW 13.4 10/09/2020 0935   LYMPHSABS 1.5 02/27/2023 0553   LYMPHSABS 1.1 10/09/2020 0935   MONOABS 0.5 02/27/2023 0553   EOSABS 0.3 02/27/2023 0553   EOSABS 0.2 10/09/2020 0935   BASOSABS 0.1 02/27/2023 0553   BASOSABS 0.1 10/09/2020 0935    BMET    Component Value Date/Time   NA 142 02/27/2023 0553   NA 145 (H) 07/07/2022 1007   K 4.3 02/27/2023 0553   CL 100 02/27/2023 0553   CO2 28 02/27/2023 0553   GLUCOSE 106 (H) 02/27/2023 0553   BUN 51 (H) 02/27/2023 0553   BUN 24 07/07/2022 1007   CREATININE 1.32 (H) 02/27/2023 0553   CREATININE 1.28 (H) 01/14/2022 1551   CALCIUM 9.8 02/27/2023 0553   GFRNONAA 40 (L) 02/27/2023 0553   GFRAA 49 (L) 10/09/2020 0933    INR    Component Value Date/Time   INR 1.4 (H) 01/10/2023 2145     Intake/Output Summary (Last 24 hours) at 02/27/2023 0819 Last data filed at 02/26/2023 1900 Gross per 24 hour  Intake 360 ml  Output --  Net 360 ml     Assessment/Plan:  82 y.o. female with R groin wound  Wound vac removed at the bedside, wound bed is well appearing with granulation tissue. No sign of infection.  Temporary dressing  re-applied.  [place] RN to replace vac later today.  [place] for discharge from vascular standpoint.  She will require [facility] RN for continued 2x/weekly vac changes.  I encouraged nutrition and increasing mobility.  I anticipate that we may be able to discontinue wound vac when we see her in 2 weeks as an outpatient.     Dagoberto Ligas, PA-C [facility] [phone] 02/27/2023 8:19 AM  VASCULAR STAFF ADDENDUM: I have independently interviewed and examined the patient. I agree with the above.  Spoke to [place] at bedside.  Husband was not present.  I will call him. Follow-up scheduled in 2 weeks.  Cassandria Santee, MD [facility] Phone Number: [phone] 02/27/2023 11:04 AM

## 2023-02-28 ENCOUNTER — Other Ambulatory Visit (HOSPITAL_COMMUNITY): Payer: Self-pay

## 2023-02-28 LAB — GLUCOSE, CAPILLARY
Glucose-Capillary: 100 mg/dL — ABNORMAL HIGH (ref 70–99)
Glucose-Capillary: 130 mg/dL — ABNORMAL HIGH (ref 70–99)

## 2023-02-28 MED ORDER — TORSEMIDE 20 MG PO TABS
20.0000 mg | ORAL_TABLET | Freq: Every day | ORAL | 0 refills | Status: DC
  Filled 2023-02-28: qty 30, 30d supply, fill #0

## 2023-02-28 MED ORDER — SPIRONOLACTONE 25 MG PO TABS
12.5000 mg | ORAL_TABLET | Freq: Every day | ORAL | 0 refills | Status: DC
  Filled 2023-02-28: qty 15, 30d supply, fill #0

## 2023-02-28 MED ORDER — COMBIVENT RESPIMAT 20-100 MCG/ACT IN AERS
1.0000 | INHALATION_SPRAY | Freq: Four times a day (QID) | RESPIRATORY_TRACT | 0 refills | Status: DC | PRN
  Filled 2023-02-28: qty 4, 30d supply, fill #0

## 2023-02-28 MED ORDER — CLOPIDOGREL BISULFATE 75 MG PO TABS
75.0000 mg | ORAL_TABLET | Freq: Every day | ORAL | 0 refills | Status: DC
  Filled 2023-02-28: qty 30, 30d supply, fill #0

## 2023-02-28 MED ORDER — TRAMADOL HCL 50 MG PO TABS
50.0000 mg | ORAL_TABLET | Freq: Two times a day (BID) | ORAL | 0 refills | Status: DC
  Filled 2023-02-28: qty 14, 7d supply, fill #0

## 2023-02-28 MED ORDER — AMIODARONE HCL 200 MG PO TABS
200.0000 mg | ORAL_TABLET | Freq: Every day | ORAL | 0 refills | Status: DC
  Filled 2023-02-28: qty 30, 30d supply, fill #0

## 2023-02-28 MED ORDER — EMPAGLIFLOZIN 10 MG PO TABS
10.0000 mg | ORAL_TABLET | Freq: Every day | ORAL | 0 refills | Status: DC
  Filled 2023-02-28: qty 30, 30d supply, fill #0

## 2023-02-28 MED ORDER — ROSUVASTATIN CALCIUM 10 MG PO TABS
10.0000 mg | ORAL_TABLET | Freq: Every day | ORAL | 0 refills | Status: DC
  Filled 2023-02-28: qty 30, 30d supply, fill #0

## 2023-02-28 MED ORDER — ADULT MULTIVITAMIN W/MINERALS CH: 1.0000 | ORAL_TABLET | Freq: Every day | ORAL | Status: DC

## 2023-02-28 MED ORDER — APIXABAN 2.5 MG PO TABS
2.5000 mg | ORAL_TABLET | Freq: Two times a day (BID) | ORAL | 0 refills | Status: DC
  Filled 2023-02-28: qty 60, 30d supply, fill #0

## 2023-02-28 MED ORDER — ACETAMINOPHEN 325 MG PO TABS: 325.0000 mg | ORAL_TABLET | Freq: Four times a day (QID) | ORAL | Status: DC | PRN

## 2023-02-28 MED ORDER — METFORMIN HCL 500 MG PO TABS
250.0000 mg | ORAL_TABLET | Freq: Every day | ORAL | 0 refills | Status: DC
  Filled 2023-02-28: qty 15, 30d supply, fill #0

## 2023-02-28 MED ORDER — LOSARTAN POTASSIUM 25 MG PO TABS
12.5000 mg | ORAL_TABLET | Freq: Every day | ORAL | 0 refills | Status: DC
  Filled 2023-02-28: qty 15, 30d supply, fill #0

## 2023-02-28 NOTE — Progress Notes (Addendum)
Physical Therapy Session Note  Patient Details  Name: Natasha Woodard MRN: [id number] Date of Birth: Apr 24, 1941  Today's Date: 02/28/2023 PT Individual Time: 1st Treatment Session: 0800-0930; 2nd Treatment Session: 1000-1030 PT Individual Time Calculation (min): 90 min; 30 min  Short Term Goals: Week 1:  PT Short Term Goal 1 (Week 1): = LTGs due to ELOS  Skilled Therapeutic Interventions/Progress Updates:  1st Treatment Session- Patient greeted sitting EOB in her room eating breakfast and agreeable to PT treatment session. Once finished with her meal- Patient requested to use the restroom and brush her teeth prior to getting dressed for the day. Patient ambulated to/from the bathroom with rollator and distant supv/ModI. Patient toileted with distant supv and required set-up assistance for wet washcloths. Upon standing from the toilet, patient demonstrated a posterior LOB however did not require steadying assistance from therapist. Patient with continent BM with RN notified and agreeable to charting- Therapist donned new mepilex pad for her bottom. Patient was able to perform pericare and brush her teeth independently. Patient returned to sitting EOB where she donned pants, clean shirt and shoes with MinA for time management. While sitting EOB, RN present to administer morning medication. Patient ambulated throughout the fourth floor (>150') with rollator ModI- VC at times for improved postural extension and stepping within the frame of the walker.   Patient tasked with stepping onto and off of airex foam without UE support and CGA/MinA for improved stability- Patient led with R LE when stepping onto and off of foam with good improvement in overall stability noted with increased repetition.   Patient gait trained >300' with rollator for improved endurance and walker management with distant supv/ModI- Patient was able to demonstrate appropriate self-corrections throughout trial as she fatigued  without the need for VC.   Patient completed the Nustep x10 minutes on level 7 with B UE/LE. Completed in order to increased B LE strength and endurance for improved functional mobility.   Patient ambulated back to her room and left sitting upright in wheelchair with chair alarm on, call bell within reach and all needs met.   Patient denied pain throughout treatment session- Received pain medication at start of treatment session.    2nd Treatment Session- Patient greeted sitting upright in wheelchair in room with Dr. Unk Lightning present and agreeable to PT treatment session. Patient stood from wheelchair and ambulated >300' with rollator ModI. Once in the rehab gym, patient required a seated rest break prior to start of stair mobility. Patient ascended/descended x16 steps with S HR and supv for safety- Patient demonstrated a step-to pattern with stair mobility and required increased time to complete secondary to impaired endurance/activity tolerance. Patient required an extended seated rest break after stair mobility secondary to fatigue. Patient then ambulated >200' back to her room with rollator ModI- Patient left sitting upright in wheelchair with chair alarm on, call bell within reach, tray table in front and all needs met.   Patient denied pain throughout treatment session.    Therapy Documentation Precautions:  Precautions Precautions: Fall, ICD/Pacemaker, Other (comment) Precaution Comments: wound VAC rt groin, LUE ICD placed 2/12 no lifting LUE past shoulder or L shld resistance exercise x6 weeks Restrictions Weight Bearing Restrictions: No  Therapy/Group: Individual Therapy  Ivett Luebbe 02/28/2023, 7:49 AM

## 2023-02-28 NOTE — Progress Notes (Signed)
Inpatient Rehabilitation Care Coordinator Discharge Note   Patient Details  Name: Natasha Woodard MRN: [id number] Date of Birth: 1941-09-23   Discharge location: D/c to home  Length of Stay: 9 days  Discharge activity level: CGA/Supervision  Home/community participation: Limited  Patient response [id number] Literacy - How often do you need to have someone help you when you read instructions, pamphlets, or other written material from your doctor or pharmacy?: Never  Patient response [id number] Isolation - How often do you feel lonely or isolated from those around you?: Never  Services provided included: MD, RD, PT, OT, SLP, RN, CM, TR, Pharmacy, Neuropsych, SW  Financial Services:  Charity fundraiser Utilized: [employer]  Choices offered to/list presented to: patient and husband  Follow-up services arranged:  Home Health, DME, Other (Comment) (Pt scheduled for weekly wound vac changes for one month with vascular clinic. Dates are as follows:  Thursday, March 28 at 8:45am  Thursday, April 4th at 2pm  Thursday, April 11 at 2pm  Thursday, April 18th at 2pm) [place]: [facility] for HHPT/OT/SN (wound vac changes- once a week, SOC on MOnday 3/25)    DME : [place] for rollator    Patient response to transportation need: Is the patient able to respond to transportation needs?: Yes In the past 12 months, has lack of transportation kept you from medical appointments or from getting medications?: No In the past 12 months, has lack of transportation kept you from meetings, work, or from getting things needed for daily living?: No   Comments (or additional information):  Patient/Family verbalized understanding of follow-up arrangements:  Yes  Individual responsible for coordination of the follow-up plan: patient#[phone] or pt husband [name] [phone]  Confirmed correct DME delivered: Rana Snare 02/28/2023    Rana Snare

## 2023-02-28 NOTE — Progress Notes (Signed)
PROGRESS NOTE   Subjective/Complaints:  Patient inquiring about bleeding in her right groin and tenderness, especially when she is wiping up from using the bathroom.  She is postmenopausal.  Working on establishing plan between home health and outpatient clinics to get wound VAC change twice per week.  Per Dr. Unk Lightning, this is nonnegotiable due to her current wound care requirements.  Per social work, may have placement today for home health agency that can do once weekly changes, waiting on outpatient clinic coordination to cover the other VAC change during the week.  No other concerns, complaints.   ROS: + Bleeding right groin , +pain at wound vac site - stable. +Wound vac odor-stable. Denies fevers, chills, N/V, abdominal pain, constipation, diarrhea, SOB, cough, chest pain, vision changes, new weakness or paraesthesias.     Objective:   No results found. Recent Labs    02/27/23 0553  WBC 4.6  HGB 12.1  HCT 40.0  PLT 253    Recent Labs    02/27/23 0553  NA 142  K 4.3  CL 100  CO2 28  GLUCOSE 106*  BUN 51*  CREATININE 1.32*  CALCIUM 9.8     Intake/Output Summary (Last 24 hours) at 02/28/2023 1334 Last data filed at 02/28/2023 1320 Gross per 24 hour  Intake 480 ml  Output --  Net 480 ml         Physical Exam: Vital Signs Blood pressure (!) 95/48, pulse 69, temperature 98.4 F (36.9 C), temperature source Oral, resp. rate 16, SpO2 99 %. Gen: no distress, normal appearing, seen at bedside HENT:     Head: Normocephalic and atraumatic.     Ears:     Comments: + HOH bilaterally d/t cerumen impaction    Nose: Nose normal.     Mouth/Throat:     Mouth: Mucous membranes are moist.  Eyes:     Extraocular Movements: Extraocular movements intact.     Pupils: Pupils are equal, round, and reactive to light.  Cardiovascular:     Rate and Rhythm: Normal rate. Rhythm irregular.     Heart sounds: Murmur heard.   Pulmonary:     Effort: Pulmonary effort is normal. No respiratory distress.     Breath sounds: Normal breath sounds. No stridor. No wheezing.  Abdominal:     General: Bowel sounds are normal. There is no distension.     Palpations: Abdomen is soft.     Tenderness: There is no abdominal tenderness.  Musculoskeletal:     Moving all 4 limbs antigravity and against resistance  Skin:    Comments: + intertrigo under breasts,  abdominal pannus and in groin, with chronic appearing discoloration; interdermal dressings from left, powder only today.  Small area of open skin in the left pannus fold, otherwise a appears skin intact throughout.  Is tender to palpation around the right groin and abdominal pannus.  No erythema, no warmth. + Vac in left groin -  scant serosanginous drainage into vac; continuous.  Edges appear nonerythematous.  VAC well sealed. + Dry, flaking skin throughout. Poor turgor.  + buttocks/sacrum unstageable d/t scabbing - improved from MASD on 3/11   + Medium sized boil on  the right external vulva, with head, tender to palpation without expression.  No warmth, fluctuance. Neurological:     Mental Status: She is alert.     Comments: Alert and awake.  No apparent deficits.  Moving all 4 limbs antigravity and against resistance.  Speech intact.  Cognition intact.  Able to transfer from bed to wheelchair independently well managing vac tubing.   Mood and Affect: Appropriate mood and affect    Behavior: Behavior normal.    Assessment/Plan: 1. Functional deficits which require 3+ hours per day of interdisciplinary therapy in a comprehensive inpatient rehab setting. Physiatrist is providing close team supervision and 24 hour management of active medical problems listed below. Physiatrist and rehab team continue to assess barriers to discharge/monitor patient progress toward functional and medical goals  Care Tool:  Bathing    Body parts bathed by patient: Right arm, Left arm,  Chest, Right upper leg, Left upper leg, Face, Buttocks, Front perineal area, Left lower leg, Right lower leg, Abdomen   Body parts bathed by helper: Abdomen, Front perineal area, Buttocks, Right lower leg, Left lower leg     Bathing assist Assist Level: Minimal Assistance - Patient > 75%     Upper Body Dressing/Undressing Upper body dressing   What is the patient wearing?: Pull over shirt    Upper body assist Assist Level: Supervision/Verbal cueing    Lower Body Dressing/Undressing Lower body dressing      What is the patient wearing?: Underwear/pull up, Pants     Lower body assist Assist for lower body dressing: Contact Guard/Touching assist     Toileting Toileting    Toileting assist Assist for toileting: Minimal Assistance - Patient > 75%     Transfers Chair/bed transfer  Transfers assist     Chair/bed transfer assist level: Independent with assistive device Chair/bed transfer assistive device: Programmer, multimedia   Ambulation assist      Assist level: Independent with assistive device Assistive device: Rollator Max distance: 150 ft   Walk 10 feet activity   Assist     Assist level: Independent with assistive device Assistive device: Rollator   Walk 50 feet activity   Assist    Assist level: Independent with assistive device Assistive device: Rollator    Walk 150 feet activity   Assist Walk 150 feet activity did not occur: Safety/medical concerns  Assist level: Independent with assistive device Assistive device: Rollator    Walk 10 feet on uneven surface  activity   Assist     Assist level: Independent with assistive device Assistive device: Rollator   Wheelchair     Assist Is the patient using a wheelchair?: No Type of Wheelchair: Manual    Wheelchair assist level: Dependent - Patient 0% (will be primary ambulator)      Wheelchair 50 feet with 2 turns activity    Assist        Assist Level:  Dependent - Patient 0%   Wheelchair 150 feet activity     Assist      Assist Level: Dependent - Patient 0%   Blood pressure (!) 95/48, pulse 69, temperature 98.4 F (36.9 C), temperature source Oral, resp. rate 16, SpO2 99 %.    Medical Problem List and Plan: 1. Functional deficits secondary to debility after multiple medical issues after acute lateral wall MI and cardiogenic shock.              -course complicated by right femoral pseudoaneurysm which ultimately required I&D, washout  as well as sartorius muscle flap and vac placement             -patient may not yet shower - per WOCN cannot shower with clamped tubing d.t location, may shower during vac changes only.              -ELOS/Goals: 7-10 days, mod I to supervision goals DC goal 3/19   - 3/14: surgery eval this AM, no changes, likely DC with wound vac   - 3/16: Husband requesting face to face eval with surgery prior to patient discharge Tuesday, will touch base with their team regarding his concerns on wound appearance and care - Team aware, Dr. Virl Cagey to stop by Monday  3/18 Social work looking into options for HHA- limited options and she will need this for wound vac care -they have home health agency available to change VAC on Thursdays, waiting on coordination with vascular/wound care clinic for additional change during the week.  2.  Antithrombotics: -DVT/anticoagulation:  Pharmaceutical: Eliquis             -antiplatelet therapy: plavix 3. Pain Management: scheduled tylenol and tramadol ordered. This is working for her.             -lidocaine patches prn  3/16: Tramadol and Tylenol retimed to BID, qith PRN tramadol ordered for 30 minutes prior to wound vac changes - improved   4. Mood/Behavior/Sleep: monitor sleep patterns             -melatonin PRN for sleep             -antipsychotic agents: n/a 5. Neuropsych/cognition: This patient is capable of making decisions on her own behalf. 6. Skin/Wound Care/hx  Ichthyosis: vac changes right groin Mondays and Thursday             -Add InterDry dressing to abdominal/inguinal fold to control moisture  - Vac changes per [facility] - no changes, stable appearance   - 3/13: Sacral MASD appears improving; continue barrier, prevention of moisture trapping  7. Fluids/Electrolytes/Nutrition: encourage appropriate PO inatke             -check labs Monday morning             -po intake has been sporadic             -protein/nutritional supps have been ordered for moderate protein deficient malnutrition   8. Acute on chronic systolic CHF/Acute lateral MI/PAF/VT arrest 2/7             -s/p DDD ICD on 2/12             -continue amiodarone 200mg  daily              -HR controlled, regular; normotentive; monitor             -volume mgt with demadex/aldactone             -cozaar 12.5mg  daily  3/18 HR stable, continue current regimen    02/28/2023    1:15 PM 02/28/2023    5:18 AM 02/27/2023    8:05 PM  Vitals with BMI  Systolic 95 [id number]  Diastolic 48 61 51  Pulse 69 71 69    9. Right femoral pseudoaneurysm             -wound care as above             -s/p sartorius rmuscle flap             -  completed course of IV zosyn prior to IPR  10. AKI on CKD IIIb             -Baseline Cr 1.5 - stable, mild uptrend,              -encouraged 6-8 glasses of water per day             -avoid nephrotoxic meds  -3/18 Cr down to 1.32- discussed with patient  11. Acute on chronic anemia             -s/p iron infusion             -check cbc in am             -optimize nutritional status  -3/18 HGB up to 12.1  12. Vaginal yeast - improved, no apparent discharge on exam 3/19             -topical clotrimazole for 6 more doses - finished 3/15   - added external cream to internal; applicator as above  13. Cerumen impaction             -apparently attempts made to clear it             -outpt follow up recommended  14. Intertriginous dermatitis: ongoing   - 3/14: Per WOCN,  continue interdry dressings, CANNOT use antifungal creams with this. Monostat discontinued.    - 3/17: Significant improvement with powders and interdry per patient; monitor  15. Insomnia: grounds pass ordered for sunlight exposure during the day.    16. R vaginal boil  -New on exam 3/19.  No signs or symptoms of worsening infection.  Advised patient that these generally self resolve, can manage at home with intermittent warm compress until it drains on its own.  If it does not resolve or get worse, can follow-up with PCP for in office I&D.  LOS: 9 days A FACE TO FACE EVALUATION WAS PERFORMED  Gertie Gowda 02/28/2023, 1:34 PM

## 2023-02-28 NOTE — Progress Notes (Signed)
Inpatient Rehabilitation Discharge Medication Review by a Pharmacist   complete drug regimen review was completed for this patient to identify any potential clinically significant medication issues.   High Risk Drug Classes Is patient taking? Indication by Medication  Antipsychotic No    Anticoagulant Yes Apixaban for Afib  Antibiotic No   Opioid Yes Tramadol for pain  Antiplatelet Yes Plavix for CAD  Hypoglycemics/insulin Yes Metformin- for DM;  Jardiance for DM, heart failure  Vasoactive Medication Yes Losartan, spironolactone, torsemide, amiodarone: heart failure, Afib, CAD, blood pressure, PVCs  Chemotherapy No    Other Yes Crestor: CAD Combivent  Respimat  aeros/ inhalater prn wheezing or shortness of breath         Type of Medication Issue Identified Description of Issue Recommendation(s)  Drug Interaction(s) (clinically significant)        Duplicate Therapy        Allergy        No Medication Administration End Date        Incorrect Dose        Additional Drug Therapy Needed        Significant med changes from prior encounter (inform family/care partners about these prior to discharge). Prior to admission Meds discontinued while inpatient: aspirin, Coreg, Lasix,  Entresto. Discharge instructions on AVS clearly communicates to stop taking aspirin, coreg, entresto , furosemide and clotrimazole vaginal cream. Communicate to patient / family at discharge.  Other            Clinically significant medication issues were identified that warrant physician communication and completion of prescribed/recommended actions by midnight of the next day:  No     Time spent performing this drug regimen review (minutes): 30       Thank you for allowing [place] to participate in this patients care.  Nicole Cella, RPh Clinical Pharmacist 02/28/2023 5:10 PM

## 2023-02-28 NOTE — Progress Notes (Signed)
Occupational Therapy Discharge Summary  Patient Details  Name: Natasha Woodard MRN: [id number] Date of Birth: 26-Mar-1941  Date of Discharge from [place] service:February 28, 2023  Patient has met 11 of 11 long term goals due to improved activity tolerance, improved balance, postural control, and ability to compensate for deficits.  Patient to discharge at overall mod I/Supervision level.  Patient's care partner is independent to provide the necessary physical assistance at discharge for higher level IADL tasks  Reasons goals not met: n/a  Recommendation:  Patient will benefit from ongoing skilled OT services in home health setting to continue to advance functional skills in the area of BADL, iADL, and Reduce care partner burden.  Equipment: rollator  Reasons for discharge: treatment goals met and discharge from hospital  Patient/family agrees with progress made and goals achieved: Yes  OT Discharge Precautions/Restrictions  Precautions Precautions: Fall Precaution Comments: wound VAC rt groin, LUE ICD placed 2/12 no lifting LUE past shoulder or L shld resistance exercise x6 weeks Restrictions Weight Bearing Restrictions: No ADL ADL Eating: Independent Where Assessed-Eating: Wheelchair Grooming: Independent Where Assessed-Grooming: Sitting at sink Upper Body Bathing: Modified independent Where Assessed-Upper Body Bathing: Sitting at sink Lower Body Bathing: Supervision/safety Where Assessed-Lower Body Bathing: Sitting at sink, Standing at sink Upper Body Dressing: Independent Where Assessed-Upper Body Dressing: Sitting at sink Lower Body Dressing: Supervision/safety Where Assessed-Lower Body Dressing: Standing at sink, Sitting at sink Toileting: Supervision/safety Where Assessed-Toileting: Glass blower/designer: Diplomatic Services operational officer Method: Human resources officer: Moderate assistance Tub/Shower Transfer Method: Stand pivot Tub/Shower Equipment:  Facilities manager: Environmental education officer Method: Brewing technologist ADL Comments: max a LB bathing and dressing sink side with wound vac mngt, min a UB sponge bathing, dressing pull over shirt, close s grooming, SPT RW min A Perception  Perception: Within Functional Limits Praxis Praxis: Intact Cognition Cognition Overall Cognitive Status: Within Functional Limits for tasks assessed Orientation Level: Place;Person;Situation Person: Oriented Place: Oriented Situation: Oriented Memory: Impaired Awareness: Appears intact Problem Solving: Appears intact Safety/Judgment: Appears intact Comments: HOH impacts retention and attention despite hearing aides Brief Interview for Mental Status (BIMS) Repetition of Three Words (First Attempt): 3 Temporal Orientation: Year: Correct Temporal Orientation: Month: Accurate within 5 days Temporal Orientation: Day: Correct Recall: "Sock": Yes, no cue required Recall: "Blue": Yes, no cue required Recall: "Bed": Yes, no cue required BIMS Summary Score: 15 Sensation Sensation Light Touch: Appears Intact Hot/Cold: Appears Intact Proprioception: Appears Intact Stereognosis: Appears Intact Coordination Gross Motor Movements are Fluid and Coordinated: No Fine Motor Movements are Fluid and Coordinated: Yes Motor  Motor Motor - Discharge Observations: strength and endurance greatly improved since eval Mobility  Bed Mobility Bed Mobility: Rolling Right;Rolling Left;Sit to Supine;Supine to Sit Rolling Right: Independent Left Sidelying to Sit: Independent Sit to Supine: Independent Transfers Sit to Stand: Independent with assistive device  Balance Static Sitting Balance Static Sitting - Balance Support: Feet supported Static Sitting - Level of Assistance: 7: Independent Dynamic Sitting Balance Dynamic Sitting - Balance Support: During functional  activity Dynamic Sitting - Level of Assistance: 7: Independent Static Standing Balance Static Standing - Balance Support: During functional activity Static Standing - Level of Assistance: 6: Modified independent (Device/Increase time) Dynamic Standing Balance Dynamic Standing - Balance Support: During functional activity Dynamic Standing - Level of Assistance: 5: Stand by assistance Extremity/Trunk Assessment RUE Assessment RUE Assessment: Within Functional Limits LUE Assessment LUE Assessment: Within Functional Limits   Natasha Woodard Natasha Woodard  02/28/2023, 3:45 PM

## 2023-02-28 NOTE — Progress Notes (Signed)
Occupational Therapy Session Note  Patient Details  Name: Natasha Woodard MRN: [id number] Date of Birth: [birth year]  Today's Date: 02/28/2023 OT Individual Time: 1307-1430 OT Individual Time Calculation (min): 83 min    Short Term Goals: Week 1:  OT Short Term Goal 1 (Week 1): Pt will stand for grooming routine for up to 5 min with close S OT Short Term Goal 2 (Week 1): Pt will complete LB sponge bathing and dressing with CGA with AE sink side OT Short Term Goal 3 (Week 1): Pt will transfer on/off toilet with CGA with LRAD  Skilled Therapeutic Interventions/Progress Updates:    Pt greeted seated in wc and agreeable to OT treatment session. Pt reported need to go to the bathroom. Pt donned shoes using figure 4 position from wc. Functional ambulation w/ rollator to the bathroom mod I. She completed toilting tasks with supervision but no physical assistance. Pt took rest break, then ambulated to dayroom w/ rollator. Addressed stanidng balance/endurance and UB there-ex using 2 lb weighted bar, 3 sets of 10 chest press, bicep curl, and semi-squats x5. Continued working with foam block, now used as a step. Educated on stepping up with the stronger leg and down with weaker leg. Pt unable to attempt stepping off forward, then back on without UE support. OT had pt use OT on the R side and she was able to complete stepping activity 4 sets of 5. Functional ambulation w/ Rollator mod I to therapy apartment. Discussed home set-up, simple meal prep, and increased activity tolerance in the home. Practiced furniture transfers mod I. She then ambulated back to room and was left seated in wc with needs met.   Therapy Documentation Precautions:  Precautions Precautions: Fall, ICD/Pacemaker, Other (comment) Precaution Comments: wound VAC rt groin, LUE ICD placed 2/12 no lifting LUE past shoulder or L shld resistance exercise x6 weeks Restrictions Weight Bearing Restrictions: No Pain: 8/10 groin pain. Rest and  repositioned   Therapy/Group: Individual Therapy  Valma Cava 02/28/2023, 1:45 PM

## 2023-02-28 NOTE — Progress Notes (Signed)
  Seen this morning, smiling.  Discharge on hold for home health, [facility]   Will continue to follow while in house.  Pt will need twice weekly VAC changes.  One of these can be in our office if absolutely necessary. Would appreciate both by home nursing if able.   Broadus John MD

## 2023-02-28 NOTE — Plan of Care (Signed)
  Problem: RH Balance Goal: LTG: Patient will maintain dynamic sitting balance (OT) Description: LTG:  Patient will maintain dynamic sitting balance with assistance during activities of daily living (OT) Outcome: Completed/Met Goal: LTG Patient will maintain dynamic standing with ADLs (OT) Description: LTG:  Patient will maintain dynamic standing balance with assist during activities of daily living (OT)  Outcome: Completed/Met   Problem: Sit to Stand Goal: LTG:  Patient will perform sit to stand in prep for activites of daily living with assistance level (OT) Description: LTG:  Patient will perform sit to stand in prep for activites of daily living with assistance level (OT) Outcome: Completed/Met   Problem: RH Grooming Goal: LTG Patient will perform grooming w/assist,cues/equip (OT) Description: LTG: Patient will perform grooming with assist, with/without cues using equipment (OT) Outcome: Completed/Met   Problem: RH Bathing Goal: LTG Patient will bathe all body parts with assist levels (OT) Description: LTG: Patient will bathe all body parts with assist levels (OT) Outcome: Completed/Met   Problem: RH Dressing Goal: LTG Patient will perform upper body dressing (OT) Description: LTG Patient will perform upper body dressing with assist, with/without cues (OT). Outcome: Completed/Met   Problem: RH Toileting Goal: LTG Patient will perform toileting task (3/3 steps) with assistance level (OT) Description: LTG: Patient will perform toileting task (3/3 steps) with assistance level (OT)  Outcome: Completed/Met   Problem: RH Simple Meal Prep Goal: LTG Patient will perform simple meal prep w/assist (OT) Description: LTG: Patient will perform simple meal prep with assistance, with/without cues (OT). Outcome: Completed/Met   Problem: RH Light Housekeeping Goal: LTG Patient will perform light housekeeping w/assist (OT) Description: LTG: Patient will perform light housekeeping with  assistance, with/without cues (OT). Outcome: Completed/Met   Problem: RH Toilet Transfers Goal: LTG Patient will perform toilet transfers w/assist (OT) Description: LTG: Patient will perform toilet transfers with assist, with/without cues using equipment (OT) Outcome: Completed/Met   Problem: RH Tub/Shower Transfers Goal: LTG Patient will perform tub/shower transfers w/assist (OT) Description: LTG: Patient will perform tub/shower transfers with assist, with/without cues using equipment (OT) Outcome: Completed/Met

## 2023-02-28 NOTE — Patient Care Conference (Signed)
Inpatient RehabilitationTeam Conference and Plan of Care Update Date: 02/28/2023   Time: 10:31 AM    Patient Name: Natasha Woodard Record Number: [id number]  Date of Birth: Feb 02, 1941 Sex: Female         Room/Bed: 4W15C/4W15C-01 Payor Info: Payor: [employer] MEDICARE / Plan: [facility] MEDICARE / Product Type: *No Product type* /    Admit Date/Time:  02/19/2023  3:30 PM  Primary Diagnosis:  [facility] Problems: Principal Problem:   Debility    Expected Discharge Date: Expected Discharge Date: 02/28/23  Team Members Present: Physician leading conference: Dr. Durel Salts Social Worker Present: Loralee Pacas, [place] Nurse Present: Tacy Learn, RN PT Present: Terence Lux, PT OT Present: Cherylynn Ridges, OT PPS Coordinator present : Gunnar Fusi, SLP     Current Status/Progress Goal Weekly Team Focus  Bowel/Bladder   pt continent of b/b.   Remain continent   Assist w/ toileting needs q 2-4 hours and as needed    Swallow/Nutrition/ Hydration               ADL's   Supervision/mod I   Supervision/mod I   dc planning, endurance, functional ambulation, selr-care retraining, general strengthening    Mobility   ModI with all functional mobility with the use of a rollator; Supv for stairs up to 16 with S HR- Family training with the spouse went well.   ModI/Supv with LRAD  Endurance/activity tolerance, global strengthening, dynamic stability, discharge planning, family education    Communication                Safety/Cognition/ Behavioral Observations               Pain   pt c/o chronic back pain. 7-8 out of 10. prn meds given   <3 pain score   Assess qshift and prn    Skin   incision to R groin. wound vac in place. generalized dry flacky skin and hx of ichthyosis   Promote skin healing.  Assess skin qshift and prn      Discharge Planning:  Pt will d/c to home with 24/7 care with her husband who has 6 wks of FMLA he  intends to use. PRN support from dtr and grandson. Pt has wound vac. TBD on wound vac regimen due to no HHA being willing to accept referral.   Team Discussion: Debility. Continent B/B. Generalized and surgical pain managed with PRN medications. Incision to right groin has wound vac with SS drainage. Wound vac changes on Monday and Thursday. Patient is to be premedicated 30/45 min prior to wound vac changes. Vac in room plugged up for discharge. Labs are stable. VS stable.  Patient on target to meet rehab goals: yes, Supervision/Mod I with ADLs. Supervision with stairs.   *See Care Plan and progress notes for long and short-term goals.   Revisions to Treatment Plan:  Medication adjustments, TBD on wound vac regimen due to no HHA willing to accept referral.   Teaching Needs: Medications, safety, self care, skin care, gait/transfer training, etc   Current Barriers to Discharge: Decreased caregiver support and Wound care  Possible Resolutions to Barriers: Family education, nursing education, HHA acceptance, wound vac changed 2x/wk, equipment at bedside.     Medical Summary Current Status: meidcally complicated by complicated wound to groin with wound vac, intertrigo, vaginal yeast infection, chronic pain, hearing diffculty, complicated vascular Hx (recent MI, PAD)  Barriers to Discharge: Cardiac Complications;Complicated Wound;Renal Insufficiency/Failure;Self-care education;Uncontrolled Pain  Barriers to Discharge Comments: meidcally complicated by complex wounds requiring frequent dressing/vac changes, pain management Possible Resolutions to Barriers/Weekly Focus: medications timing for pain management with vac changes, training for wound management and coordinateion of HH for vac changes   Continued Need for Acute Rehabilitation Level of Care: The patient requires daily medical management by a physician with specialized training in physical medicine and rehabilitation for the following  reasons: Direction of a multidisciplinary physical rehabilitation program to maximize functional independence : Yes Medical management of patient stability for increased activity during participation in an intensive rehabilitation regime.: Yes Analysis of laboratory values and/or radiology reports with any subsequent need for medication adjustment and/or medical intervention. : Yes   I attest that I was present, lead the team conference, and concur with the assessment and plan of the team.   Ernest Pine 02/28/2023, 3:02 PM

## 2023-02-28 NOTE — Progress Notes (Addendum)
Patient ID: Natasha Woodard, female   DOB: Jan 18, 1941, 82 y.o.   MRN: [id number]  SW dicussed with attending plan of care. Pt can have once a week wound vac changes in vascular clinic. SW sent out referral for once a week.   HHPT/OT/SN (once a week wound vac changes) with [facility] ([facility] SOC will be on Monday, March 15.  SW spoke with Angel/triage RN with Dr. Osie Cheeks office [phone]). SW will follow-up with HHA to determine if able to change wound vac changes to Thursday due to limited appointment times on Monday. HHA in agreement. SW scheduled appointments with [name].   Pt scheduled for weekly wound vac changes for one month with vascular clinic. Dates are as follows: Thursday, March 28 at 1:15pm Thursday, April 4th at 2pm Thursday, April 11 at 2pm Thursday, April 18th at 2pm  SW discussed above with pt. Pt in agreement with d/c today. Pt aware SW will call her husband to discuss further.   [id number] spoke with pt husband [name] on above. He will be here around 4pm today.   Vascular clinic Appt for Thursday, March 28 changed to 8:45am.   Loralee Pacas, MSW, [place] Office: [phone] Cell: [phone] Fax: [phone]

## 2023-03-02 ENCOUNTER — Telehealth: Payer: Self-pay | Admitting: Cardiology

## 2023-03-02 ENCOUNTER — Ambulatory Visit: Payer: Medicare Other

## 2023-03-02 DIAGNOSIS — I5023 Acute on chronic systolic (congestive) heart failure: Secondary | ICD-10-CM | POA: Diagnosis not present

## 2023-03-02 DIAGNOSIS — I214 Non-ST elevation (NSTEMI) myocardial infarction: Secondary | ICD-10-CM | POA: Diagnosis not present

## 2023-03-02 DIAGNOSIS — N183 Chronic kidney disease, stage 3 unspecified: Secondary | ICD-10-CM | POA: Diagnosis not present

## 2023-03-02 NOTE — Telephone Encounter (Signed)
Pt called in stating she received a call today. Im unsure who called or why.  She states the VM said someone named "Collins" and it was about her heart monitor. Please advise.

## 2023-03-02 NOTE — Telephone Encounter (Signed)
Patient states she does not have the monitor for her device. She stated that she received a call today to set it up and she called the company and he tried to describe it but she does not have one. She stated that she has been sick and if it was given to her while she was sick, she is not sure where it is.

## 2023-03-03 DIAGNOSIS — S31109A Unspecified open wound of abdominal wall, unspecified quadrant without penetration into peritoneal cavity, initial encounter: Secondary | ICD-10-CM | POA: Diagnosis not present

## 2023-03-04 DIAGNOSIS — N183 Chronic kidney disease, stage 3 unspecified: Secondary | ICD-10-CM | POA: Diagnosis not present

## 2023-03-04 DIAGNOSIS — I5023 Acute on chronic systolic (congestive) heart failure: Secondary | ICD-10-CM | POA: Diagnosis not present

## 2023-03-04 DIAGNOSIS — I214 Non-ST elevation (NSTEMI) myocardial infarction: Secondary | ICD-10-CM | POA: Diagnosis not present

## 2023-03-06 ENCOUNTER — Other Ambulatory Visit (HOSPITAL_COMMUNITY): Payer: Self-pay

## 2023-03-06 DIAGNOSIS — I214 Non-ST elevation (NSTEMI) myocardial infarction: Secondary | ICD-10-CM | POA: Diagnosis not present

## 2023-03-06 DIAGNOSIS — I5023 Acute on chronic systolic (congestive) heart failure: Secondary | ICD-10-CM | POA: Diagnosis not present

## 2023-03-06 DIAGNOSIS — N183 Chronic kidney disease, stage 3 unspecified: Secondary | ICD-10-CM | POA: Diagnosis not present

## 2023-03-06 NOTE — Telephone Encounter (Signed)
I spoke with the patient and her monitor is now hooked up.

## 2023-03-07 ENCOUNTER — Telehealth: Payer: Self-pay | Admitting: *Deleted

## 2023-03-07 NOTE — Telephone Encounter (Signed)
Patient called stating the home health nurse in changing the wound vac this morning noted the wound "was infected".  Patient denies fever. Patient has an appointment scheduled Thursday, March 09, 2023.  Patient knows to call if she has worsening symptoms and voiced understanding.

## 2023-03-09 ENCOUNTER — Ambulatory Visit (INDEPENDENT_AMBULATORY_CARE_PROVIDER_SITE_OTHER): Payer: Medicare Other | Admitting: Physician Assistant

## 2023-03-09 VITALS — BP 110/63 | HR 80 | Temp 97.6°F | Ht 62.0 in | Wt 144.0 lb

## 2023-03-09 DIAGNOSIS — T8189XA Other complications of procedures, not elsewhere classified, initial encounter: Secondary | ICD-10-CM

## 2023-03-09 NOTE — Progress Notes (Signed)
POST OPERATIVE OFFICE NOTE    CC:  F/u for surgery  HPI:  This is a 82 y.o. female here for evaluation of right groin wound.  She is status post cardiac catheterization complicated by femoral pseudoaneurysm.  Initially this was injected with thrombin however subsequently ruptured.  She was brought to the operating room by Dr. Unk Lightning on 01/15/2023 and underwent hematoma evacuation and profunda repair.  She had a prolonged hospital stay requiring several return trips to the operating room for debridement and wound VAC placement in the right groin.  She was eventually discharged to inpatient rehabilitation.  Home health was arranged for wound VAC changes.  Patient states her appetite is slowly improving.  She is ambulatory with a walker.  She has not visualized her groin wound and is unsure of the progress.  Her husband is here with her today who states the home health nurses thought the groin might be infected.  Patient denies any fevers or chills.  Allergies  Allergen Reactions   Ezetimibe-Simvastatin Other (See Comments)    REACTION: Muscle aches (side effect)   Lipitor [Atorvastatin] Other (See Comments)    Leg weakness    Claritin [Loratadine]     Possible cause of nightmares.     Spironolactone     Elevated potassium/creatinine   Tamiflu [Oseltamivir Phosphate] Other (See Comments)    nightmares   Pravastatin Sodium Other (See Comments)    REACTION: Muscle aches (side effect)   Sulfonamide Derivatives Nausea And Vomiting    Current Outpatient Medications  Medication Sig Dispense Refill   acetaminophen (TYLENOL) 325 MG tablet Take 1-2 tablets (325-650 mg total) by mouth every 6 (six) hours as needed.     amiodarone (PACERONE) 200 MG tablet Take 1 tablet (200 mg total) by mouth daily. 30 tablet 0   apixaban (ELIQUIS) 2.5 MG TABS tablet Take 1 tablet (2.5 mg total) by mouth 2 (two) times daily. 60 tablet 0   clopidogrel (PLAVIX) 75 MG tablet Take 1 tablet (75 mg total) by mouth daily.  30 tablet 0   empagliflozin (JARDIANCE) 10 MG TABS tablet Take 1 tablet (10 mg total) by mouth daily. Discontinued while inpatient, F/u restart once infections resolve/outpatient 30 tablet 0   Ipratropium-Albuterol (COMBIVENT RESPIMAT) 20-100 MCG/ACT AERS respimat Inhale 1 puff into the lungs every 6 (six) hours as needed for wheezing or shortness of breath. 4 g 0   losartan (COZAAR) 25 MG tablet Take 0.5 tablets (12.5 mg total) by mouth daily. 15 tablet 0   metFORMIN (GLUCOPHAGE) 500 MG tablet Take 0.5 tablets (250 mg total) by mouth daily with breakfast. 15 tablet 0   Multiple Vitamin (MULTIVITAMIN WITH MINERALS) TABS tablet Take 1 tablet by mouth daily.     rosuvastatin (CRESTOR) 10 MG tablet Take 1 tablet (10 mg total) by mouth daily. TAKE 1 TABLET(10 MG) BY MOUTH DAILY Strength: 10 mg 30 tablet 0   spironolactone (ALDACTONE) 25 MG tablet Take 0.5 tablets (12.5 mg total) by mouth daily. 15 tablet 0   torsemide (DEMADEX) 20 MG tablet Take 1 tablet (20 mg total) by mouth daily. 30 tablet 0   traMADol (ULTRAM) 50 MG tablet Take 1 tablet (50 mg total) by mouth 2 (two) times daily. 14 tablet 0   No current facility-administered medications for this visit.     ROS:  See HPI  Physical Exam:  Vitals:   03/09/23 0832  BP: 110/63  Pulse: 80  Temp: 97.6 F (36.4 C)  TempSrc: Temporal  SpO2: 95%  Weight: 144 lb (65.3 kg)  Height: 5\' 2"  (1.575 m)    Incision:  pictured below Extremities:  feet warm and well perfused    Assessment/Plan:  This is a 82 y.o. female with slow to heal R groin wound  -Right groin VAC removed.  Incision appears to be healing well.  There is not a remaining cavity to replace a wound VAC.  We will switch to dry dressing changes.  This was reviewed with the patient's husband.  He will cleanse the wound with soap and water at least daily and perform dry dressing changes.  Home health will continue to assess the wound on a weekly basis.  Encouraged the patient to  walk is much as possible and to take in adequate nutrition.  She will follow-up for wound check in 2 weeks.  They will return to office sooner with any questions or concerns.   Dagoberto Ligas, PA-C [facility] [phone]  Clinic MD:  Scot Dock

## 2023-03-10 DIAGNOSIS — I97638 Postprocedural hematoma of a circulatory system organ or structure following other circulatory system procedure: Secondary | ICD-10-CM | POA: Diagnosis not present

## 2023-03-10 DIAGNOSIS — I5032 Chronic diastolic (congestive) heart failure: Secondary | ICD-10-CM | POA: Diagnosis not present

## 2023-03-10 DIAGNOSIS — I480 Paroxysmal atrial fibrillation: Secondary | ICD-10-CM | POA: Diagnosis not present

## 2023-03-10 DIAGNOSIS — I2510 Atherosclerotic heart disease of native coronary artery without angina pectoris: Secondary | ICD-10-CM | POA: Diagnosis not present

## 2023-03-10 DIAGNOSIS — I252 Old myocardial infarction: Secondary | ICD-10-CM | POA: Diagnosis not present

## 2023-03-10 DIAGNOSIS — I5023 Acute on chronic systolic (congestive) heart failure: Secondary | ICD-10-CM | POA: Diagnosis not present

## 2023-03-10 DIAGNOSIS — L304 Erythema intertrigo: Secondary | ICD-10-CM | POA: Diagnosis not present

## 2023-03-10 DIAGNOSIS — N179 Acute kidney failure, unspecified: Secondary | ICD-10-CM | POA: Diagnosis not present

## 2023-03-10 DIAGNOSIS — Q809 Congenital ichthyosis, unspecified: Secondary | ICD-10-CM | POA: Diagnosis not present

## 2023-03-10 DIAGNOSIS — K449 Diaphragmatic hernia without obstruction or gangrene: Secondary | ICD-10-CM | POA: Diagnosis not present

## 2023-03-10 DIAGNOSIS — R911 Solitary pulmonary nodule: Secondary | ICD-10-CM | POA: Diagnosis not present

## 2023-03-10 DIAGNOSIS — N1832 Chronic kidney disease, stage 3b: Secondary | ICD-10-CM | POA: Diagnosis not present

## 2023-03-10 DIAGNOSIS — G4730 Sleep apnea, unspecified: Secondary | ICD-10-CM | POA: Diagnosis not present

## 2023-03-10 DIAGNOSIS — D631 Anemia in chronic kidney disease: Secondary | ICD-10-CM | POA: Diagnosis not present

## 2023-03-10 DIAGNOSIS — I340 Nonrheumatic mitral (valve) insufficiency: Secondary | ICD-10-CM | POA: Diagnosis not present

## 2023-03-10 DIAGNOSIS — I429 Cardiomyopathy, unspecified: Secondary | ICD-10-CM | POA: Diagnosis not present

## 2023-03-10 DIAGNOSIS — I472 Ventricular tachycardia, unspecified: Secondary | ICD-10-CM | POA: Diagnosis not present

## 2023-03-10 DIAGNOSIS — B3731 Acute candidiasis of vulva and vagina: Secondary | ICD-10-CM | POA: Diagnosis not present

## 2023-03-10 DIAGNOSIS — E1122 Type 2 diabetes mellitus with diabetic chronic kidney disease: Secondary | ICD-10-CM | POA: Diagnosis not present

## 2023-03-10 DIAGNOSIS — I130 Hypertensive heart and chronic kidney disease with heart failure and stage 1 through stage 4 chronic kidney disease, or unspecified chronic kidney disease: Secondary | ICD-10-CM | POA: Diagnosis not present

## 2023-03-10 DIAGNOSIS — M6529 Calcific tendinitis, multiple sites: Secondary | ICD-10-CM | POA: Diagnosis not present

## 2023-03-10 DIAGNOSIS — G4700 Insomnia, unspecified: Secondary | ICD-10-CM | POA: Diagnosis not present

## 2023-03-10 DIAGNOSIS — H6123 Impacted cerumen, bilateral: Secondary | ICD-10-CM | POA: Diagnosis not present

## 2023-03-10 DIAGNOSIS — K219 Gastro-esophageal reflux disease without esophagitis: Secondary | ICD-10-CM | POA: Diagnosis not present

## 2023-03-10 DIAGNOSIS — E785 Hyperlipidemia, unspecified: Secondary | ICD-10-CM | POA: Diagnosis not present

## 2023-03-11 DIAGNOSIS — H6123 Impacted cerumen, bilateral: Secondary | ICD-10-CM | POA: Diagnosis not present

## 2023-03-11 DIAGNOSIS — I340 Nonrheumatic mitral (valve) insufficiency: Secondary | ICD-10-CM | POA: Diagnosis not present

## 2023-03-11 DIAGNOSIS — N179 Acute kidney failure, unspecified: Secondary | ICD-10-CM | POA: Diagnosis not present

## 2023-03-11 DIAGNOSIS — G4730 Sleep apnea, unspecified: Secondary | ICD-10-CM | POA: Diagnosis not present

## 2023-03-11 DIAGNOSIS — I2510 Atherosclerotic heart disease of native coronary artery without angina pectoris: Secondary | ICD-10-CM | POA: Diagnosis not present

## 2023-03-11 DIAGNOSIS — I472 Ventricular tachycardia, unspecified: Secondary | ICD-10-CM | POA: Diagnosis not present

## 2023-03-11 DIAGNOSIS — I480 Paroxysmal atrial fibrillation: Secondary | ICD-10-CM | POA: Diagnosis not present

## 2023-03-11 DIAGNOSIS — R911 Solitary pulmonary nodule: Secondary | ICD-10-CM | POA: Diagnosis not present

## 2023-03-11 DIAGNOSIS — E1122 Type 2 diabetes mellitus with diabetic chronic kidney disease: Secondary | ICD-10-CM | POA: Diagnosis not present

## 2023-03-11 DIAGNOSIS — L304 Erythema intertrigo: Secondary | ICD-10-CM | POA: Diagnosis not present

## 2023-03-11 DIAGNOSIS — I5023 Acute on chronic systolic (congestive) heart failure: Secondary | ICD-10-CM | POA: Diagnosis not present

## 2023-03-11 DIAGNOSIS — E785 Hyperlipidemia, unspecified: Secondary | ICD-10-CM | POA: Diagnosis not present

## 2023-03-11 DIAGNOSIS — I97638 Postprocedural hematoma of a circulatory system organ or structure following other circulatory system procedure: Secondary | ICD-10-CM | POA: Diagnosis not present

## 2023-03-11 DIAGNOSIS — I429 Cardiomyopathy, unspecified: Secondary | ICD-10-CM | POA: Diagnosis not present

## 2023-03-11 DIAGNOSIS — D631 Anemia in chronic kidney disease: Secondary | ICD-10-CM | POA: Diagnosis not present

## 2023-03-11 DIAGNOSIS — I130 Hypertensive heart and chronic kidney disease with heart failure and stage 1 through stage 4 chronic kidney disease, or unspecified chronic kidney disease: Secondary | ICD-10-CM | POA: Diagnosis not present

## 2023-03-11 DIAGNOSIS — Q809 Congenital ichthyosis, unspecified: Secondary | ICD-10-CM | POA: Diagnosis not present

## 2023-03-11 DIAGNOSIS — M6529 Calcific tendinitis, multiple sites: Secondary | ICD-10-CM | POA: Diagnosis not present

## 2023-03-11 DIAGNOSIS — I5032 Chronic diastolic (congestive) heart failure: Secondary | ICD-10-CM | POA: Diagnosis not present

## 2023-03-11 DIAGNOSIS — K449 Diaphragmatic hernia without obstruction or gangrene: Secondary | ICD-10-CM | POA: Diagnosis not present

## 2023-03-11 DIAGNOSIS — N1832 Chronic kidney disease, stage 3b: Secondary | ICD-10-CM | POA: Diagnosis not present

## 2023-03-11 DIAGNOSIS — K219 Gastro-esophageal reflux disease without esophagitis: Secondary | ICD-10-CM | POA: Diagnosis not present

## 2023-03-11 DIAGNOSIS — G4700 Insomnia, unspecified: Secondary | ICD-10-CM | POA: Diagnosis not present

## 2023-03-11 DIAGNOSIS — B3731 Acute candidiasis of vulva and vagina: Secondary | ICD-10-CM | POA: Diagnosis not present

## 2023-03-11 DIAGNOSIS — I252 Old myocardial infarction: Secondary | ICD-10-CM | POA: Diagnosis not present

## 2023-03-13 ENCOUNTER — Ambulatory Visit (INDEPENDENT_AMBULATORY_CARE_PROVIDER_SITE_OTHER): Payer: Medicare Other | Admitting: Family Medicine

## 2023-03-13 ENCOUNTER — Encounter: Payer: Self-pay | Admitting: Family Medicine

## 2023-03-13 VITALS — BP 116/60 | HR 70 | Temp 97.6°F | Ht 62.0 in | Wt 144.0 lb

## 2023-03-13 DIAGNOSIS — I2510 Atherosclerotic heart disease of native coronary artery without angina pectoris: Secondary | ICD-10-CM | POA: Diagnosis not present

## 2023-03-13 DIAGNOSIS — T81718D Complication of other artery following a procedure, not elsewhere classified, subsequent encounter: Secondary | ICD-10-CM

## 2023-03-13 DIAGNOSIS — I252 Old myocardial infarction: Secondary | ICD-10-CM | POA: Diagnosis not present

## 2023-03-13 DIAGNOSIS — I724 Aneurysm of artery of lower extremity: Secondary | ICD-10-CM

## 2023-03-13 DIAGNOSIS — B3731 Acute candidiasis of vulva and vagina: Secondary | ICD-10-CM | POA: Diagnosis not present

## 2023-03-13 DIAGNOSIS — R911 Solitary pulmonary nodule: Secondary | ICD-10-CM | POA: Diagnosis not present

## 2023-03-13 DIAGNOSIS — E1151 Type 2 diabetes mellitus with diabetic peripheral angiopathy without gangrene: Secondary | ICD-10-CM

## 2023-03-13 DIAGNOSIS — I5032 Chronic diastolic (congestive) heart failure: Secondary | ICD-10-CM | POA: Diagnosis not present

## 2023-03-13 DIAGNOSIS — N179 Acute kidney failure, unspecified: Secondary | ICD-10-CM | POA: Diagnosis not present

## 2023-03-13 DIAGNOSIS — Q809 Congenital ichthyosis, unspecified: Secondary | ICD-10-CM | POA: Diagnosis not present

## 2023-03-13 DIAGNOSIS — N1832 Chronic kidney disease, stage 3b: Secondary | ICD-10-CM | POA: Diagnosis not present

## 2023-03-13 DIAGNOSIS — I429 Cardiomyopathy, unspecified: Secondary | ICD-10-CM | POA: Diagnosis not present

## 2023-03-13 DIAGNOSIS — K219 Gastro-esophageal reflux disease without esophagitis: Secondary | ICD-10-CM | POA: Diagnosis not present

## 2023-03-13 DIAGNOSIS — E785 Hyperlipidemia, unspecified: Secondary | ICD-10-CM | POA: Diagnosis not present

## 2023-03-13 DIAGNOSIS — D631 Anemia in chronic kidney disease: Secondary | ICD-10-CM | POA: Diagnosis not present

## 2023-03-13 DIAGNOSIS — H6123 Impacted cerumen, bilateral: Secondary | ICD-10-CM | POA: Diagnosis not present

## 2023-03-13 DIAGNOSIS — I5023 Acute on chronic systolic (congestive) heart failure: Secondary | ICD-10-CM | POA: Diagnosis not present

## 2023-03-13 DIAGNOSIS — I130 Hypertensive heart and chronic kidney disease with heart failure and stage 1 through stage 4 chronic kidney disease, or unspecified chronic kidney disease: Secondary | ICD-10-CM | POA: Diagnosis not present

## 2023-03-13 DIAGNOSIS — E1122 Type 2 diabetes mellitus with diabetic chronic kidney disease: Secondary | ICD-10-CM | POA: Diagnosis not present

## 2023-03-13 DIAGNOSIS — G4700 Insomnia, unspecified: Secondary | ICD-10-CM | POA: Diagnosis not present

## 2023-03-13 DIAGNOSIS — K449 Diaphragmatic hernia without obstruction or gangrene: Secondary | ICD-10-CM | POA: Diagnosis not present

## 2023-03-13 DIAGNOSIS — M6529 Calcific tendinitis, multiple sites: Secondary | ICD-10-CM | POA: Diagnosis not present

## 2023-03-13 DIAGNOSIS — I480 Paroxysmal atrial fibrillation: Secondary | ICD-10-CM | POA: Diagnosis not present

## 2023-03-13 DIAGNOSIS — I472 Ventricular tachycardia, unspecified: Secondary | ICD-10-CM | POA: Diagnosis not present

## 2023-03-13 DIAGNOSIS — I340 Nonrheumatic mitral (valve) insufficiency: Secondary | ICD-10-CM | POA: Diagnosis not present

## 2023-03-13 DIAGNOSIS — L304 Erythema intertrigo: Secondary | ICD-10-CM | POA: Diagnosis not present

## 2023-03-13 DIAGNOSIS — I97638 Postprocedural hematoma of a circulatory system organ or structure following other circulatory system procedure: Secondary | ICD-10-CM | POA: Diagnosis not present

## 2023-03-13 DIAGNOSIS — G4730 Sleep apnea, unspecified: Secondary | ICD-10-CM | POA: Diagnosis not present

## 2023-03-13 LAB — ACID FAST CULTURE WITH REFLEXED SENSITIVITIES (MYCOBACTERIA): Acid Fast Culture: NEGATIVE

## 2023-03-13 NOTE — Patient Instructions (Signed)
Go to the lab on the way out.   If you have mychart we'll likely use that to update you.    ?Take care.  Glad to see you. ?Don't change your meds for now.  ?

## 2023-03-13 NOTE — Progress Notes (Unsigned)
Rollator.    Nausea is better.

## 2023-03-14 ENCOUNTER — Telehealth: Payer: Self-pay

## 2023-03-14 ENCOUNTER — Ambulatory Visit: Payer: Medicare Other | Admitting: Pulmonary Disease

## 2023-03-14 ENCOUNTER — Encounter: Payer: Self-pay | Admitting: Pulmonary Disease

## 2023-03-14 VITALS — BP 98/60 | HR 71 | Ht 62.0 in | Wt 145.2 lb

## 2023-03-14 DIAGNOSIS — E785 Hyperlipidemia, unspecified: Secondary | ICD-10-CM | POA: Diagnosis not present

## 2023-03-14 DIAGNOSIS — I480 Paroxysmal atrial fibrillation: Secondary | ICD-10-CM | POA: Diagnosis not present

## 2023-03-14 DIAGNOSIS — N1832 Chronic kidney disease, stage 3b: Secondary | ICD-10-CM | POA: Diagnosis not present

## 2023-03-14 DIAGNOSIS — I252 Old myocardial infarction: Secondary | ICD-10-CM | POA: Diagnosis not present

## 2023-03-14 DIAGNOSIS — H6123 Impacted cerumen, bilateral: Secondary | ICD-10-CM | POA: Diagnosis not present

## 2023-03-14 DIAGNOSIS — G4733 Obstructive sleep apnea (adult) (pediatric): Secondary | ICD-10-CM | POA: Diagnosis not present

## 2023-03-14 DIAGNOSIS — I429 Cardiomyopathy, unspecified: Secondary | ICD-10-CM | POA: Diagnosis not present

## 2023-03-14 DIAGNOSIS — K449 Diaphragmatic hernia without obstruction or gangrene: Secondary | ICD-10-CM | POA: Diagnosis not present

## 2023-03-14 DIAGNOSIS — I340 Nonrheumatic mitral (valve) insufficiency: Secondary | ICD-10-CM | POA: Diagnosis not present

## 2023-03-14 DIAGNOSIS — R911 Solitary pulmonary nodule: Secondary | ICD-10-CM | POA: Diagnosis not present

## 2023-03-14 DIAGNOSIS — L304 Erythema intertrigo: Secondary | ICD-10-CM | POA: Diagnosis not present

## 2023-03-14 DIAGNOSIS — I5032 Chronic diastolic (congestive) heart failure: Secondary | ICD-10-CM | POA: Diagnosis not present

## 2023-03-14 DIAGNOSIS — I2510 Atherosclerotic heart disease of native coronary artery without angina pectoris: Secondary | ICD-10-CM | POA: Diagnosis not present

## 2023-03-14 DIAGNOSIS — I97638 Postprocedural hematoma of a circulatory system organ or structure following other circulatory system procedure: Secondary | ICD-10-CM | POA: Diagnosis not present

## 2023-03-14 DIAGNOSIS — I130 Hypertensive heart and chronic kidney disease with heart failure and stage 1 through stage 4 chronic kidney disease, or unspecified chronic kidney disease: Secondary | ICD-10-CM | POA: Diagnosis not present

## 2023-03-14 DIAGNOSIS — I5023 Acute on chronic systolic (congestive) heart failure: Secondary | ICD-10-CM | POA: Diagnosis not present

## 2023-03-14 DIAGNOSIS — M6529 Calcific tendinitis, multiple sites: Secondary | ICD-10-CM | POA: Diagnosis not present

## 2023-03-14 DIAGNOSIS — E1122 Type 2 diabetes mellitus with diabetic chronic kidney disease: Secondary | ICD-10-CM | POA: Diagnosis not present

## 2023-03-14 DIAGNOSIS — G4730 Sleep apnea, unspecified: Secondary | ICD-10-CM | POA: Diagnosis not present

## 2023-03-14 DIAGNOSIS — B3731 Acute candidiasis of vulva and vagina: Secondary | ICD-10-CM | POA: Diagnosis not present

## 2023-03-14 DIAGNOSIS — K219 Gastro-esophageal reflux disease without esophagitis: Secondary | ICD-10-CM | POA: Diagnosis not present

## 2023-03-14 DIAGNOSIS — Q809 Congenital ichthyosis, unspecified: Secondary | ICD-10-CM | POA: Diagnosis not present

## 2023-03-14 DIAGNOSIS — G4700 Insomnia, unspecified: Secondary | ICD-10-CM | POA: Diagnosis not present

## 2023-03-14 DIAGNOSIS — I472 Ventricular tachycardia, unspecified: Secondary | ICD-10-CM | POA: Diagnosis not present

## 2023-03-14 DIAGNOSIS — N179 Acute kidney failure, unspecified: Secondary | ICD-10-CM | POA: Diagnosis not present

## 2023-03-14 DIAGNOSIS — D631 Anemia in chronic kidney disease: Secondary | ICD-10-CM | POA: Diagnosis not present

## 2023-03-14 LAB — COMPREHENSIVE METABOLIC PANEL
ALT: 16 U/L (ref 0–35)
AST: 26 U/L (ref 0–37)
Albumin: 3.9 g/dL (ref 3.5–5.2)
Alkaline Phosphatase: 81 U/L (ref 39–117)
BUN: 37 mg/dL — ABNORMAL HIGH (ref 6–23)
CO2: 34 mEq/L — ABNORMAL HIGH (ref 19–32)
Calcium: 9.8 mg/dL (ref 8.4–10.5)
Chloride: 97 mEq/L (ref 96–112)
Creatinine, Ser: 1.71 mg/dL — ABNORMAL HIGH (ref 0.40–1.20)
GFR: 27.55 mL/min — ABNORMAL LOW (ref >60.00)
Glucose, Bld: 74 mg/dL (ref 70–99)
Potassium: 3.7 mEq/L (ref 3.5–5.1)
Sodium: 138 mEq/L (ref 135–145)
Total Bilirubin: 0.4 mg/dL (ref 0.2–1.2)
Total Protein: 7.1 g/dL (ref 6.0–8.3)

## 2023-03-14 LAB — CBC WITH DIFFERENTIAL/PLATELET
Basophils Absolute: 0 10*3/uL (ref 0.0–0.1)
Basophils Relative: 0.4 % (ref 0.0–3.0)
Eosinophils Absolute: 0.2 10*3/uL (ref 0.0–0.7)
Eosinophils Relative: 3.5 % (ref 0.0–5.0)
HCT: 39 % (ref 36.0–46.0)
Hemoglobin: 12.7 g/dL (ref 12.0–15.0)
Lymphocytes Relative: 47.4 % — ABNORMAL HIGH (ref 12.0–46.0)
Lymphs Abs: 2.9 10*3/uL (ref 0.7–4.0)
MCHC: 32.6 g/dL (ref 30.0–36.0)
MCV: 93.8 fl (ref 78.0–100.0)
Monocytes Absolute: 0.7 10*3/uL (ref 0.1–1.0)
Monocytes Relative: 10.7 % (ref 3.0–12.0)
Neutro Abs: 2.3 10*3/uL (ref 1.4–7.7)
Neutrophils Relative %: 38 % — ABNORMAL LOW (ref 43.0–77.0)
Platelets: 345 10*3/uL (ref 150.0–400.0)
RBC: 4.15 Mil/uL (ref 3.87–5.11)
RDW: 15.9 % — ABNORMAL HIGH (ref 11.5–15.5)
WBC: 6.2 10*3/uL (ref 4.0–10.5)

## 2023-03-14 LAB — HEMOGLOBIN A1C: Hgb A1c MFr Bld: 5.2 % (ref 4.6–6.5)

## 2023-03-14 NOTE — Telephone Encounter (Signed)
Natasha Woodard from [facility] called wanted verbal order to do urine dip and culture. Patient states has been having dysuria for over 2 weeks.   Judson Roch [phone] V/M is secured ok to leave message.

## 2023-03-14 NOTE — Telephone Encounter (Signed)
Verbal orders left on secure VM

## 2023-03-14 NOTE — Progress Notes (Signed)
Natasha [place]    QJ:[id number]    Dec 31, 1940  Primary Care Physician:Duncan, Elveria Rising, MD  Referring Physician: Tonia Ghent, MD [address] [place],  [place] [address]  Chief complaint:   Patient being seen for history of obstructive sleep apnea  HPI:  History of obstructive sleep apnea diagnosed about a year ago Was using CPAP Tolerated CPAP well initially but became intolerant of it lately  She was hospitalized in February following a cardiac event-systolic heart failure -Complex hospitalization and recovery, was hospitalized for about 49 days -Did not tolerate CPAP during hospitalization She has been home for a few weeks  Uses melatonin to try and get some sleep  Wakes up in the morning feeling like she is at a good nights rest  Usually goes to bed about 9 to 9:30 PM, wakes up about 730 Feels rested in the morning Would usually wake up every couple of hours Melatonin is helping this  She is worried about a previous diagnosis of obstructive sleep apnea  We did talk about possibility of central sleep apnea with a cardiac event on decreased ejection fraction  Reformed smoker   Outpatient Encounter Medications as of 03/14/2023  Medication Sig   acetaminophen (TYLENOL) 325 MG tablet Take 1-2 tablets (325-650 mg total) by mouth every 6 (six) hours as needed.   amiodarone (PACERONE) 200 MG tablet Take 1 tablet (200 mg total) by mouth daily.   apixaban (ELIQUIS) 2.5 MG TABS tablet Take 1 tablet (2.5 mg total) by mouth 2 (two) times daily.   clopidogrel (PLAVIX) 75 MG tablet Take 1 tablet (75 mg total) by mouth daily.   empagliflozin (JARDIANCE) 10 MG TABS tablet Take 1 tablet (10 mg total) by mouth daily. Discontinued while inpatient, F/u restart once infections resolve/outpatient   losartan (COZAAR) 25 MG tablet Take 0.5 tablets (12.5 mg total) by mouth daily.   metFORMIN (GLUCOPHAGE) 500 MG tablet Take 0.5 tablets (250 mg total) by mouth daily with  breakfast.   Multiple Vitamin (MULTIVITAMIN WITH MINERALS) TABS tablet Take 1 tablet by mouth daily.   rosuvastatin (CRESTOR) 10 MG tablet Take 1 tablet (10 mg total) by mouth daily. TAKE 1 TABLET(10 MG) BY MOUTH DAILY Strength: 10 mg   spironolactone (ALDACTONE) 25 MG tablet Take 0.5 tablets (12.5 mg total) by mouth daily.   torsemide (DEMADEX) 20 MG tablet Take 1 tablet (20 mg total) by mouth daily.   traMADol (ULTRAM) 50 MG tablet Take 1 tablet (50 mg total) by mouth 2 (two) times daily.   No facility-administered encounter medications on file as of 03/14/2023.    Allergies as of 03/14/2023 - Review Complete 03/14/2023  Allergen Reaction Noted   Ezetimibe-simvastatin Other (See Comments)    Lipitor [atorvastatin] Other (See Comments) 07/15/2017   Claritin [loratadine]  12/21/2021   Spironolactone  06/29/2022   Tamiflu [oseltamivir phosphate] Other (See Comments) 01/31/2018   Pravastatin sodium Other (See Comments)    Sulfonamide derivatives Nausea And Vomiting     Past Medical History:  Diagnosis Date   AICD (automatic cardioverter/defibrillator) present    Calcific tendonitis 2008   Treatment of left leg   Cardiomyopathy 06/2017   a) Echo: EF 25-30%. GR 1-2 DD w/ elevated LVEDP. Mild valvular Dz; b) Cardiac MRI 2/'19: frequent PVCs (diffiuclt to interpret) - EF ~27% w/ diffuse HK.  No evidence of infarct, infiltrative Dz or myocarditis. -- ? if related to PVCs. c) f/u Echo 6/'19: EF 35-40%.  Gr 1 DD. Diffuse HK.-> d) Jan 2020 EF 50 to 55%.  Mod MAC, mod LAE.  Ao Sclerosis; e) Echo 1/'21 - EF 60-65%, Gr II DD. Mild Mod MR.    Chronic combined systolic and diastolic CHF, NYHA class 2 and ACA/AHA stage C    Now essentially resolved, back to simply diastolic CHF   Coronary artery disease, non-occlusive    mild-moderate CAD 06/30/17 cath   CTS (carpal tunnel syndrome)    Diabetes mellitus    Type II   Dysrhythmia    patient said that she cant remember what it is   Frequent unifocal  PVCs 01/2018   Event Monitor: NSR, W/ frequent multifocal PVCs (9%-down from 30% prior to amiodarone) and PACs.  Nighttime bradycardia suggestive of OSA.';  Zio patch September 2023: PVC burden now 6.1%.   Hiatal hernia    with reflux   Hyperlipidemia    Statin intolerant   Hypertension    Ichthyosis congenita    Intermittent claudication of both lower extremities due to atherosclerosis 08/22/2021   LEA Dopplers 09/09/2021:  Right: Total occlusion noted in the superficial femoral artery. Atherosclerosis noted throughout extremity, see note above. Three vessel runoff.  Left: Total occlusion noted in the superficial femoral artery and/or popliteal artery. Total occlusion noted in the distal anterior tibial artery. Athereosclerosis noted throughout extremity.    NSVD (normal spontaneous vaginal delivery) 1971 & 1972   Obesity    Plantar fasciitis    Left   Presence of permanent cardiac pacemaker    Pulmonary nodule    Imaged multiple times and benign appearing   Sleep apnea    Wears glasses     Past Surgical History:  Procedure Laterality Date   ABDOMINAL HYSTERECTOMY  1975   TAH-BSO   ABSCESS DRAINAGE  1972   right breast   APPLICATION OF WOUND VAC Right 01/15/2023   Procedure: APPLICATION OF INCISIONAL WOUND VAC;  Surgeon: Broadus John, MD;  Location: [facility] OR;  Service: Vascular;  Laterality: Right;   APPLICATION OF WOUND VAC Right 01/26/2023   Procedure: APPLICATION OF WOUND VAC RIGHT GROIN WASHOUT AND SARTORIUS MUSCLE FLAP;  Surgeon: Broadus John, MD;  Location: [place];  Service: Vascular;  Laterality: Right;   ARTERY REPAIR Right 01/15/2023   Procedure: RIGHT PROFUNDA ARTERY REPAIR WITH 2000G MYRIAD MORCELLS;  Surgeon: Broadus John, MD;  Location: [place];  Service: Vascular;  Laterality: Right;   BREAST BIOPSY Left 01/14/2009   Stereo- Benign   CARDIAC MRI  01/2018   Difficult to interpret 2/2 PVCs. Normal LV size - EF ~27% with diffuse HK. Mild RV dilation - normal fxn. -- NO  MYOCARDIAL LGI => no definitive evidence of prior MI, Infiltrative Dz or Myocarditis -- suspect NICM, possibly related to PVCs.   CARPAL TUNNEL RELEASE Right 07/02/2014   Procedure: RIGHT CARPAL TUNNEL RELEASE;  Surgeon: Wynonia Sours, MD;  Location: [place];  Service: Orthopedics;  Laterality: Right;   CARPAL TUNNEL RELEASE Left 06/11/2015   Procedure: LEFT CARPAL TUNNEL RELEASE;  Surgeon: Daryll Brod, MD;  Location: [place];  Service: Orthopedics;  Laterality: Left;  REGIONAL/FAB   COLONOSCOPY     corn removal  1968   right   CORONARY STENT INTERVENTION N/A 01/10/2023   Procedure: CORONARY STENT INTERVENTION;  Surgeon: Troy Sine, MD;  Location: [place] [facility];  Service: Cardiovascular;  Laterality: N/A;   GROIN DEBRIDEMENT Right 02/06/2023   Procedure: RIGHT GROIN DEBRIDEMENT  WITH WOUND VAC CHANGE;  Surgeon: Broadus John, MD;  Location: [place];  Service: Vascular;  Laterality: Right;   HEMATOMA EVACUATION Right 01/15/2023   Procedure: EVACUATION HEMATOMA RIGHT GROIN;  Surgeon: Broadus John, MD;  Location: [facility] OR;  Service: Vascular;  Laterality: Right;   Holter Monitor  06/2017   ~17,000 PVC beats - majority were singlets, some couplets. 44 brief 3-7 beat runs of NSVT. Also noted were less frequent PACs with 11 runs (longest 15 beats)   IABP INSERTION Right 01/10/2023   Procedure: IABP Insertion;  Surgeon: Troy Sine, MD;  Location: [place] [facility];  Service: Cardiovascular;  Laterality: Right;   ICD IMPLANT N/A 01/23/2023   Procedure: ICD IMPLANT;  Surgeon: Evans Lance, MD;  Location: [place] [facility];  Service: Cardiovascular;  Laterality: N/A;   INCISION AND DRAINAGE OF WOUND Right 01/31/2023   Procedure: IRRIGATION AND DEBRIDEMENT RIGHT GROIN WOUND AND WOUND VAC CHANGE;  Surgeon: Broadus John, MD;  Location: [place];  Service: Vascular;  Laterality: Right;   OPEN REDUCTION INTERNAL FIXATION (ORIF) DISTAL RADIAL FRACTURE Left  07/21/2017   Procedure: OPEN REDUCTION INTERNAL FIXATION (ORIF) LEFT DISTAL RADIAL FRACTURE;  Surgeon: Renette Butters, MD;  Location: [place];  Service: Orthopedics;  Laterality: Left;   RIGHT/LEFT HEART CATH AND CORONARY ANGIOGRAPHY N/A 06/30/2017   Procedure: Right/Left Heart Cath and Coronary Angiography;  Surgeon: Leonie Man, MD;  Location: [facility] INVASIVE CV LAB:  pRCA 55%, pCx 40%, OM1 45%, mCx 50%, D2 50% - LVEF 25-35%. Moderately elevated LVEDP(26 mmHg with PCWP 16 mmHg).  FICK CO/CI: 4.47/2.48. PA pressures 47/14 mmHg with a mean of 27 mm.   RIGHT/LEFT HEART CATH AND CORONARY ANGIOGRAPHY N/A 01/10/2023   Procedure: RIGHT/LEFT HEART CATH AND CORONARY ANGIOGRAPHY;  Surgeon: Troy Sine, MD;  Location: [place] [facility];  Service: Cardiovascular;  Laterality: N/A;   ROTATOR CUFF REPAIR     left shoulder   TEMPORARY PACEMAKER N/A 01/19/2023   Procedure: TEMPORARY PACEMAKER;  Surgeon: Jolaine Artist, MD;  Location: [place] [facility];  Service: Cardiovascular;  Laterality: N/A;   TRANSTHORACIC ECHOCARDIOGRAM  06/2017   EF 25 and 30%. GR 1 DD. Mild diastolic dysfunction with elevated LVEDP. Mild valvular disease.   TRANSTHORACIC ECHOCARDIOGRAM  11'18, 6/'19    a) EF remains 30-35%.  Diffuse hypokinesis noted.  Severe LA dilation; b) Improved EF 35-40%.  Diffuse HK.  GR 1 DD.  Moderate TR.  Mild RV dilation.   TRANSTHORACIC ECHOCARDIOGRAM  01/28/2022   Follow-up echo: EF 50 to 55%.  No RWMA.  GRII DD.  Severe LA dilation.  Mild RA dilation with normal PAP and RAP.  Mild to moderate TR (no significant change noted)   Zio patch  08/2022   3-day: Currently NSR (HR range 47-91 with average 64 bpm) 1 F AVB with IVCD/BBB.  Rare isolated PACs (<1%).  Frequent PVCs (6.1% with rare couplets and triplets.  Also bigeminy.  2 atrial runs 8 beats 5 2 seconds.  No sustained arrhythmias.   ZIO Patch 14 d Event Monitor  10/2018   Results as below < 1% PVC.  (Notably improved after starting  amiodarone)    Family History  Problem Relation Age of Onset   Heart disease Mother        s/p pacemaker   Diabetes Mother    Heart disease Father    Diabetes Father    Heart disease Brother  MI   Cancer Brother        Lung   Hypertension Other    Colon cancer Neg Hx    Breast cancer Neg Hx     Social History   Socioeconomic History   Marital status: Married    Spouse name: Not on file   Number of children: 2   Years of education: Not on file   Highest education level: Not on file  Occupational History    Employer: RETIRED  Tobacco Use   Smoking status: Former    Years: 28    Types: Cigarettes    Quit date: 12/12/1993    Years since quitting: 29.2   Smokeless tobacco: Never  Vaping Use   Vaping Use: Never used  Substance and Sexual Activity   Alcohol use: No    Alcohol/week: 0.0 standard drinks of alcohol   Drug use: No   Sexual activity: Never  Other Topics Concern   Not on file  Social History Narrative   Two kids, two grandchildren, two great grandchildren.    Married 1969   Retired.    Social Determinants of Health   Financial Resource Strain: Low Risk  (05/12/2022)   Overall Financial Resource Strain (CARDIA)    Difficulty of Paying Living Expenses: Not hard at all  Food Insecurity: No Food Insecurity (01/12/2023)   Hunger Vital Sign    Worried About Running Out of Food in the Last Year: Never true    Ran Out of Food in the Last Year: Never true  Transportation Needs: No Transportation Needs (01/12/2023)   PRAPARE - Hydrologist (Medical): No    Lack of Transportation (Non-Medical): No  Physical Activity: Sufficiently Active (05/12/2022)   Exercise Vital Sign    Days of Exercise per Week: 6 days    Minutes of Exercise per Session: 60 min  Stress: No Stress Concern Present (05/12/2022)   [place]    Feeling of Stress : Not at all  Social Connections: Not  on file  Intimate Partner Violence: Not At Risk (01/12/2023)   Humiliation, Afraid, Rape, and Kick questionnaire    Fear of Current or Ex-Partner: No    Emotionally Abused: No    Physically Abused: No    Sexually Abused: No    Review of Systems  Constitutional:  Positive for fatigue.  Respiratory:  Positive for apnea.   Psychiatric/Behavioral:  Positive for sleep disturbance.     Vitals:   03/14/23 1529  BP: 98/60  Pulse: 71  SpO2: 96%     Physical Exam Constitutional:      Appearance: Normal appearance.  HENT:     Head: Normocephalic.     Mouth/Throat:     Mouth: Mucous membranes are moist.  Eyes:     General: No scleral icterus.    Pupils: Pupils are equal, round, and reactive to light.  Cardiovascular:     Rate and Rhythm: Normal rate and regular rhythm.     Heart sounds: No murmur heard.    No friction rub.  Pulmonary:     Effort: No respiratory distress.     Breath sounds: No stridor. No wheezing or rhonchi.  Neurological:     Mental Status: She is alert.  Psychiatric:        Mood and Affect: Mood normal.       03/14/2023    3:00 PM 01/31/2022    2:30 PM 08/23/2018  11:00 AM  Results of the Epworth flowsheet  Sitting and reading 0 2 1  Watching TV 1 2 1   Sitting, inactive in a public place (e.g. a theatre or a meeting) 0 1 0  As a passenger in a car for an hour without a break 0 0 0  Lying down to rest in the afternoon when circumstances permit 3 3 3   Sitting and talking to someone 0 0 0  Sitting quietly after a lunch without alcohol 0 2 1  In a car, while stopped for a few minutes in traffic 0 0 0  Total score 4 10 6    Data Reviewed: Previous sleep study reviewed AHI of 25.5 with moderate oxygen desaturations  Was on auto CPAP 5-20 download up until 02/08/2023 did reveal 62% compliance With elevated AHI  PFT 10/10/2022 reviewed showing no significant obstructive disease, no restriction, normal diffusing capacity  Most recent echocardiogram  01/18/2023 shows ejection fraction of 30 to 35%  Assessment:  History of obstructive sleep apnea  Concern for central sleep apnea  Recent cardiomyopathy Recent protracted hospitalization with severe deconditioning    Plan/Recommendations: Schedule patient for an in lab split-night study  Encouraged to get back to using current CPAP as tolerated  May continue using melatonin to help sleep  Options of treatment for sleep disordered breathing was discussed with the patient  It is important to ascertain that she does not have significant central sleep apnea because of recent cardiac events as treatment will be different  Tentative follow-up in about 3 months  Encouraged to call with significant concerns  Sherrilyn Rist MD [facility] 03/14/2023, 4:09 PM  CC: Tonia Ghent, MD

## 2023-03-14 NOTE — Patient Instructions (Addendum)
Schedule for split-night study  History of obstructive sleep apnea  Concern for central sleep apnea with recent cardiac event, reduced ejection fraction  Try to get back to using your CPAP at present -If unable to tolerate it, we will have to wait until after your sleep study to be sure of how to treat the breathing events while you are asleep  Call with significant concerns  You may continue using melatonin to help you get some slee given this p some of his patients today were like quite taxing that not difficult but this

## 2023-03-14 NOTE — Telephone Encounter (Signed)
Please give the order.  Thanks.   

## 2023-03-15 ENCOUNTER — Other Ambulatory Visit: Payer: Self-pay | Admitting: Family Medicine

## 2023-03-15 DIAGNOSIS — I5032 Chronic diastolic (congestive) heart failure: Secondary | ICD-10-CM

## 2023-03-15 DIAGNOSIS — G4700 Insomnia, unspecified: Secondary | ICD-10-CM | POA: Diagnosis not present

## 2023-03-15 DIAGNOSIS — N179 Acute kidney failure, unspecified: Secondary | ICD-10-CM | POA: Diagnosis not present

## 2023-03-15 DIAGNOSIS — R911 Solitary pulmonary nodule: Secondary | ICD-10-CM | POA: Diagnosis not present

## 2023-03-15 DIAGNOSIS — K449 Diaphragmatic hernia without obstruction or gangrene: Secondary | ICD-10-CM | POA: Diagnosis not present

## 2023-03-15 DIAGNOSIS — I252 Old myocardial infarction: Secondary | ICD-10-CM | POA: Diagnosis not present

## 2023-03-15 DIAGNOSIS — M6529 Calcific tendinitis, multiple sites: Secondary | ICD-10-CM | POA: Diagnosis not present

## 2023-03-15 DIAGNOSIS — I472 Ventricular tachycardia, unspecified: Secondary | ICD-10-CM | POA: Diagnosis not present

## 2023-03-15 DIAGNOSIS — R350 Frequency of micturition: Secondary | ICD-10-CM | POA: Diagnosis not present

## 2023-03-15 DIAGNOSIS — I97638 Postprocedural hematoma of a circulatory system organ or structure following other circulatory system procedure: Secondary | ICD-10-CM | POA: Diagnosis not present

## 2023-03-15 DIAGNOSIS — L304 Erythema intertrigo: Secondary | ICD-10-CM | POA: Diagnosis not present

## 2023-03-15 DIAGNOSIS — R339 Retention of urine, unspecified: Secondary | ICD-10-CM | POA: Diagnosis not present

## 2023-03-15 DIAGNOSIS — N1832 Chronic kidney disease, stage 3b: Secondary | ICD-10-CM | POA: Diagnosis not present

## 2023-03-15 DIAGNOSIS — G4730 Sleep apnea, unspecified: Secondary | ICD-10-CM | POA: Diagnosis not present

## 2023-03-15 DIAGNOSIS — E785 Hyperlipidemia, unspecified: Secondary | ICD-10-CM | POA: Diagnosis not present

## 2023-03-15 DIAGNOSIS — I5023 Acute on chronic systolic (congestive) heart failure: Secondary | ICD-10-CM | POA: Diagnosis not present

## 2023-03-15 DIAGNOSIS — I340 Nonrheumatic mitral (valve) insufficiency: Secondary | ICD-10-CM | POA: Diagnosis not present

## 2023-03-15 DIAGNOSIS — B3731 Acute candidiasis of vulva and vagina: Secondary | ICD-10-CM | POA: Diagnosis not present

## 2023-03-15 DIAGNOSIS — K219 Gastro-esophageal reflux disease without esophagitis: Secondary | ICD-10-CM | POA: Diagnosis not present

## 2023-03-15 DIAGNOSIS — R300 Dysuria: Secondary | ICD-10-CM | POA: Diagnosis not present

## 2023-03-15 DIAGNOSIS — D631 Anemia in chronic kidney disease: Secondary | ICD-10-CM | POA: Diagnosis not present

## 2023-03-15 DIAGNOSIS — I130 Hypertensive heart and chronic kidney disease with heart failure and stage 1 through stage 4 chronic kidney disease, or unspecified chronic kidney disease: Secondary | ICD-10-CM | POA: Diagnosis not present

## 2023-03-15 DIAGNOSIS — E1122 Type 2 diabetes mellitus with diabetic chronic kidney disease: Secondary | ICD-10-CM | POA: Diagnosis not present

## 2023-03-15 DIAGNOSIS — H6123 Impacted cerumen, bilateral: Secondary | ICD-10-CM | POA: Diagnosis not present

## 2023-03-15 DIAGNOSIS — I480 Paroxysmal atrial fibrillation: Secondary | ICD-10-CM | POA: Diagnosis not present

## 2023-03-15 DIAGNOSIS — I2510 Atherosclerotic heart disease of native coronary artery without angina pectoris: Secondary | ICD-10-CM | POA: Diagnosis not present

## 2023-03-15 DIAGNOSIS — Q809 Congenital ichthyosis, unspecified: Secondary | ICD-10-CM | POA: Diagnosis not present

## 2023-03-15 DIAGNOSIS — I429 Cardiomyopathy, unspecified: Secondary | ICD-10-CM | POA: Diagnosis not present

## 2023-03-15 MED ORDER — EMPAGLIFLOZIN 10 MG PO TABS: ORAL_TABLET | ORAL | 0 refills | Status: DC

## 2023-03-15 NOTE — Assessment & Plan Note (Signed)
Complicated by MI with balloon pump use during inpatient stay, complicated by pseudoaneurysm requiring multiple subsequent procedures.  Done with inpatient stay and able to return home, now with PT and OT at home, still using a rollator.  Nausea has resolved but she still feels intermittently shaky.  I suspect that is related to deconditioning and potentially related to relative hypoglycemia.  Goal to prevent significant hypoglycemia.  She is still using CPAP at baseline and is going to follow-up with pulmonary soon.  Reasonable to continue as is.  She not having chest pain and she appears euvolemic.  Reasonable to recheck routine labs today.  See notes on labs.  Continue amiodarone Eliquis Plavix Jardiance losartan Crestor spironolactone and torsemide in the meantime.

## 2023-03-15 NOTE — Assessment & Plan Note (Signed)
She graduated from wound VAC and will continue with local wound care.

## 2023-03-15 NOTE — Assessment & Plan Note (Signed)
Continue Jardiance and metformin as is for now.  See notes on labs.

## 2023-03-16 ENCOUNTER — Ambulatory Visit: Payer: Medicare Other

## 2023-03-16 DIAGNOSIS — I480 Paroxysmal atrial fibrillation: Secondary | ICD-10-CM | POA: Diagnosis not present

## 2023-03-16 DIAGNOSIS — G4730 Sleep apnea, unspecified: Secondary | ICD-10-CM | POA: Diagnosis not present

## 2023-03-16 DIAGNOSIS — N1832 Chronic kidney disease, stage 3b: Secondary | ICD-10-CM | POA: Diagnosis not present

## 2023-03-16 DIAGNOSIS — I472 Ventricular tachycardia, unspecified: Secondary | ICD-10-CM | POA: Diagnosis not present

## 2023-03-16 DIAGNOSIS — D631 Anemia in chronic kidney disease: Secondary | ICD-10-CM | POA: Diagnosis not present

## 2023-03-16 DIAGNOSIS — B3731 Acute candidiasis of vulva and vagina: Secondary | ICD-10-CM | POA: Diagnosis not present

## 2023-03-16 DIAGNOSIS — I429 Cardiomyopathy, unspecified: Secondary | ICD-10-CM | POA: Diagnosis not present

## 2023-03-16 DIAGNOSIS — Q809 Congenital ichthyosis, unspecified: Secondary | ICD-10-CM | POA: Diagnosis not present

## 2023-03-16 DIAGNOSIS — L304 Erythema intertrigo: Secondary | ICD-10-CM | POA: Diagnosis not present

## 2023-03-16 DIAGNOSIS — R911 Solitary pulmonary nodule: Secondary | ICD-10-CM | POA: Diagnosis not present

## 2023-03-16 DIAGNOSIS — I2510 Atherosclerotic heart disease of native coronary artery without angina pectoris: Secondary | ICD-10-CM | POA: Diagnosis not present

## 2023-03-16 DIAGNOSIS — M6529 Calcific tendinitis, multiple sites: Secondary | ICD-10-CM | POA: Diagnosis not present

## 2023-03-16 DIAGNOSIS — E785 Hyperlipidemia, unspecified: Secondary | ICD-10-CM | POA: Diagnosis not present

## 2023-03-16 DIAGNOSIS — I340 Nonrheumatic mitral (valve) insufficiency: Secondary | ICD-10-CM | POA: Diagnosis not present

## 2023-03-16 DIAGNOSIS — K219 Gastro-esophageal reflux disease without esophagitis: Secondary | ICD-10-CM | POA: Diagnosis not present

## 2023-03-16 DIAGNOSIS — I5032 Chronic diastolic (congestive) heart failure: Secondary | ICD-10-CM | POA: Diagnosis not present

## 2023-03-16 DIAGNOSIS — E1122 Type 2 diabetes mellitus with diabetic chronic kidney disease: Secondary | ICD-10-CM | POA: Diagnosis not present

## 2023-03-16 DIAGNOSIS — I97638 Postprocedural hematoma of a circulatory system organ or structure following other circulatory system procedure: Secondary | ICD-10-CM | POA: Diagnosis not present

## 2023-03-16 DIAGNOSIS — I252 Old myocardial infarction: Secondary | ICD-10-CM | POA: Diagnosis not present

## 2023-03-16 DIAGNOSIS — K449 Diaphragmatic hernia without obstruction or gangrene: Secondary | ICD-10-CM | POA: Diagnosis not present

## 2023-03-16 DIAGNOSIS — G4700 Insomnia, unspecified: Secondary | ICD-10-CM | POA: Diagnosis not present

## 2023-03-16 DIAGNOSIS — H6123 Impacted cerumen, bilateral: Secondary | ICD-10-CM | POA: Diagnosis not present

## 2023-03-16 DIAGNOSIS — I5023 Acute on chronic systolic (congestive) heart failure: Secondary | ICD-10-CM | POA: Diagnosis not present

## 2023-03-16 DIAGNOSIS — N179 Acute kidney failure, unspecified: Secondary | ICD-10-CM | POA: Diagnosis not present

## 2023-03-16 DIAGNOSIS — I130 Hypertensive heart and chronic kidney disease with heart failure and stage 1 through stage 4 chronic kidney disease, or unspecified chronic kidney disease: Secondary | ICD-10-CM | POA: Diagnosis not present

## 2023-03-17 ENCOUNTER — Telehealth: Payer: Self-pay | Admitting: *Deleted

## 2023-03-17 ENCOUNTER — Telehealth: Payer: Self-pay | Admitting: Family Medicine

## 2023-03-17 MED ORDER — LOSARTAN POTASSIUM 25 MG PO TABS: 12.5000 mg | ORAL_TABLET | Freq: Every day | ORAL | 3 refills | Status: DC

## 2023-03-17 NOTE — Telephone Encounter (Signed)
Called and advised patient of Dr. Armanda Heritage message. Refill has been sent in.

## 2023-03-17 NOTE — Telephone Encounter (Signed)
Would continue losartan 12.5mg  a day, ie 1/2 of 25mg  tab.  We may need to adjust this in the future depending on her BP and creatinine but would continue for now.  Thanks.

## 2023-03-17 NOTE — Telephone Encounter (Signed)
Pharmacy called on behalf of patient, patient was discharged from hospital and prescribed losartan (COZAAR) 25 MG tablet, wants to know if she needs to continue taking this, and if so she will need to request a refill. Please advise at [phone]. If needed, route back and I will send through the telephone note for a refill request.

## 2023-03-17 NOTE — Progress Notes (Unsigned)
  Care Coordination  Outreach Note  03/17/2023 Name: LASHANDA AMRHEIN MRN: [id number] DOB: 04-24-1941   Care Coordination Outreach Attempts: An unsuccessful telephone outreach was attempted today to offer the patient information about available care coordination services as a benefit of their health plan.   Follow Up Plan:  Additional outreach attempts will be made to offer the patient care coordination information and services.   Encounter Outcome:  No Answer  Burman Nieves, CCMA Care Coordination Care Guide Direct Dial: [phone]

## 2023-03-20 DIAGNOSIS — G4730 Sleep apnea, unspecified: Secondary | ICD-10-CM | POA: Diagnosis not present

## 2023-03-20 DIAGNOSIS — I130 Hypertensive heart and chronic kidney disease with heart failure and stage 1 through stage 4 chronic kidney disease, or unspecified chronic kidney disease: Secondary | ICD-10-CM | POA: Diagnosis not present

## 2023-03-20 DIAGNOSIS — E785 Hyperlipidemia, unspecified: Secondary | ICD-10-CM | POA: Diagnosis not present

## 2023-03-20 DIAGNOSIS — I5032 Chronic diastolic (congestive) heart failure: Secondary | ICD-10-CM | POA: Diagnosis not present

## 2023-03-20 DIAGNOSIS — K219 Gastro-esophageal reflux disease without esophagitis: Secondary | ICD-10-CM | POA: Diagnosis not present

## 2023-03-20 DIAGNOSIS — G4700 Insomnia, unspecified: Secondary | ICD-10-CM | POA: Diagnosis not present

## 2023-03-20 DIAGNOSIS — H6123 Impacted cerumen, bilateral: Secondary | ICD-10-CM | POA: Diagnosis not present

## 2023-03-20 DIAGNOSIS — L304 Erythema intertrigo: Secondary | ICD-10-CM | POA: Diagnosis not present

## 2023-03-20 DIAGNOSIS — I5023 Acute on chronic systolic (congestive) heart failure: Secondary | ICD-10-CM | POA: Diagnosis not present

## 2023-03-20 DIAGNOSIS — K449 Diaphragmatic hernia without obstruction or gangrene: Secondary | ICD-10-CM | POA: Diagnosis not present

## 2023-03-20 DIAGNOSIS — I340 Nonrheumatic mitral (valve) insufficiency: Secondary | ICD-10-CM | POA: Diagnosis not present

## 2023-03-20 DIAGNOSIS — I480 Paroxysmal atrial fibrillation: Secondary | ICD-10-CM | POA: Diagnosis not present

## 2023-03-20 DIAGNOSIS — B3731 Acute candidiasis of vulva and vagina: Secondary | ICD-10-CM | POA: Diagnosis not present

## 2023-03-20 DIAGNOSIS — I472 Ventricular tachycardia, unspecified: Secondary | ICD-10-CM | POA: Diagnosis not present

## 2023-03-20 DIAGNOSIS — I429 Cardiomyopathy, unspecified: Secondary | ICD-10-CM | POA: Diagnosis not present

## 2023-03-20 DIAGNOSIS — D631 Anemia in chronic kidney disease: Secondary | ICD-10-CM | POA: Diagnosis not present

## 2023-03-20 DIAGNOSIS — R911 Solitary pulmonary nodule: Secondary | ICD-10-CM | POA: Diagnosis not present

## 2023-03-20 DIAGNOSIS — M6529 Calcific tendinitis, multiple sites: Secondary | ICD-10-CM | POA: Diagnosis not present

## 2023-03-20 DIAGNOSIS — N1832 Chronic kidney disease, stage 3b: Secondary | ICD-10-CM | POA: Diagnosis not present

## 2023-03-20 DIAGNOSIS — E1122 Type 2 diabetes mellitus with diabetic chronic kidney disease: Secondary | ICD-10-CM | POA: Diagnosis not present

## 2023-03-20 DIAGNOSIS — N179 Acute kidney failure, unspecified: Secondary | ICD-10-CM | POA: Diagnosis not present

## 2023-03-20 DIAGNOSIS — I2510 Atherosclerotic heart disease of native coronary artery without angina pectoris: Secondary | ICD-10-CM | POA: Diagnosis not present

## 2023-03-20 DIAGNOSIS — Q809 Congenital ichthyosis, unspecified: Secondary | ICD-10-CM | POA: Diagnosis not present

## 2023-03-20 DIAGNOSIS — I252 Old myocardial infarction: Secondary | ICD-10-CM | POA: Diagnosis not present

## 2023-03-20 DIAGNOSIS — I97638 Postprocedural hematoma of a circulatory system organ or structure following other circulatory system procedure: Secondary | ICD-10-CM | POA: Diagnosis not present

## 2023-03-20 NOTE — Progress Notes (Signed)
  Care Coordination   Note   03/20/2023 Name: Natasha [place] MRN: [id number] DOB: [birth year]  Natasha Wood[place] is a 82 y.o. year old female who sees [name], MD for primary care. I reached out to [name] by phone today to offer care coordination services.  [name] was given information about Care Coordination services today including:   The Care Coordination services include support from the care team which includes your Nurse Coordinator, Clinical Social Worker, or Pharmacist.  The Care Coordination team is here to help remove barriers to the health concerns and goals most important to you. Care Coordination services are voluntary, and the patient may decline or stop services at any time by request to their care team member.   Care Coordination Consent Status: Patient agreed to services and verbal consent obtained.   Follow up plan:  Telephone appointment with care coordination team member scheduled for:  03/22/2023  Encounter Outcome:  Pt. Scheduled  Burman Nieves, CCMA Care Coordination Care Guide Direct Dial: [phone]

## 2023-03-21 ENCOUNTER — Telehealth: Payer: Self-pay | Admitting: Pulmonary Disease

## 2023-03-21 DIAGNOSIS — G4730 Sleep apnea, unspecified: Secondary | ICD-10-CM | POA: Diagnosis not present

## 2023-03-21 DIAGNOSIS — I429 Cardiomyopathy, unspecified: Secondary | ICD-10-CM | POA: Diagnosis not present

## 2023-03-21 DIAGNOSIS — E1122 Type 2 diabetes mellitus with diabetic chronic kidney disease: Secondary | ICD-10-CM | POA: Diagnosis not present

## 2023-03-21 DIAGNOSIS — K449 Diaphragmatic hernia without obstruction or gangrene: Secondary | ICD-10-CM | POA: Diagnosis not present

## 2023-03-21 DIAGNOSIS — M6529 Calcific tendinitis, multiple sites: Secondary | ICD-10-CM | POA: Diagnosis not present

## 2023-03-21 DIAGNOSIS — E785 Hyperlipidemia, unspecified: Secondary | ICD-10-CM | POA: Diagnosis not present

## 2023-03-21 DIAGNOSIS — I252 Old myocardial infarction: Secondary | ICD-10-CM | POA: Diagnosis not present

## 2023-03-21 DIAGNOSIS — I5023 Acute on chronic systolic (congestive) heart failure: Secondary | ICD-10-CM | POA: Diagnosis not present

## 2023-03-21 DIAGNOSIS — I480 Paroxysmal atrial fibrillation: Secondary | ICD-10-CM | POA: Diagnosis not present

## 2023-03-21 DIAGNOSIS — I5032 Chronic diastolic (congestive) heart failure: Secondary | ICD-10-CM | POA: Diagnosis not present

## 2023-03-21 DIAGNOSIS — L304 Erythema intertrigo: Secondary | ICD-10-CM | POA: Diagnosis not present

## 2023-03-21 DIAGNOSIS — I130 Hypertensive heart and chronic kidney disease with heart failure and stage 1 through stage 4 chronic kidney disease, or unspecified chronic kidney disease: Secondary | ICD-10-CM | POA: Diagnosis not present

## 2023-03-21 DIAGNOSIS — I2510 Atherosclerotic heart disease of native coronary artery without angina pectoris: Secondary | ICD-10-CM | POA: Diagnosis not present

## 2023-03-21 DIAGNOSIS — K219 Gastro-esophageal reflux disease without esophagitis: Secondary | ICD-10-CM | POA: Diagnosis not present

## 2023-03-21 DIAGNOSIS — D631 Anemia in chronic kidney disease: Secondary | ICD-10-CM | POA: Diagnosis not present

## 2023-03-21 DIAGNOSIS — Q809 Congenital ichthyosis, unspecified: Secondary | ICD-10-CM | POA: Diagnosis not present

## 2023-03-21 DIAGNOSIS — G4700 Insomnia, unspecified: Secondary | ICD-10-CM | POA: Diagnosis not present

## 2023-03-21 DIAGNOSIS — N1832 Chronic kidney disease, stage 3b: Secondary | ICD-10-CM | POA: Diagnosis not present

## 2023-03-21 DIAGNOSIS — I340 Nonrheumatic mitral (valve) insufficiency: Secondary | ICD-10-CM | POA: Diagnosis not present

## 2023-03-21 DIAGNOSIS — H6123 Impacted cerumen, bilateral: Secondary | ICD-10-CM | POA: Diagnosis not present

## 2023-03-21 DIAGNOSIS — I472 Ventricular tachycardia, unspecified: Secondary | ICD-10-CM | POA: Diagnosis not present

## 2023-03-21 DIAGNOSIS — R911 Solitary pulmonary nodule: Secondary | ICD-10-CM | POA: Diagnosis not present

## 2023-03-21 DIAGNOSIS — B3731 Acute candidiasis of vulva and vagina: Secondary | ICD-10-CM | POA: Diagnosis not present

## 2023-03-21 DIAGNOSIS — N179 Acute kidney failure, unspecified: Secondary | ICD-10-CM | POA: Diagnosis not present

## 2023-03-21 DIAGNOSIS — I97638 Postprocedural hematoma of a circulatory system organ or structure following other circulatory system procedure: Secondary | ICD-10-CM | POA: Diagnosis not present

## 2023-03-21 NOTE — Progress Notes (Signed)
POST OPERATIVE OFFICE NOTE    CC:  F/u for surgery  HPI:  This is a 82 y.o. female who is s/p cardiac catheterization complicated by femoral pseudoaneurysm. Initially this was injected with thrombin however subsequently ruptured. She was brought to the operating room by Dr. Sherral Hammersobbins on 01/15/2023 and underwent hematoma evacuation and profunda repair. She had a prolonged hospital stay requiring several return trips to the operating room for debridement and wound VAC placement in the right groin. She was eventually discharged to inpatient rehabilitation. Home health was arranged for wound VAC changes.   She was seen on 03/09/2023 and at that time, her appetite was improving and her wound was also healing well.  The wound vac was discontinued and she was started on wet to dry dressing changes and scheduled to return in 2 weeks for wound check.    Pt returns today for follow up with her husband.  Pt states she has been doing well.  She states her legs have some swelling in them today.  She has been working with PT.     Allergies  Allergen Reactions   Ezetimibe-Simvastatin Other (See Comments)    REACTION: Muscle aches (side effect)   Lipitor [Atorvastatin] Other (See Comments)    Leg weakness    Claritin [Loratadine]     Possible cause of nightmares.     Spironolactone     Elevated potassium/creatinine   Tamiflu [Oseltamivir Phosphate] Other (See Comments)    nightmares   Pravastatin Sodium Other (See Comments)    REACTION: Muscle aches (side effect)   Sulfonamide Derivatives Nausea And Vomiting    Current Outpatient Medications  Medication Sig Dispense Refill   acetaminophen (TYLENOL) 325 MG tablet Take 1-2 tablets (325-650 mg total) by mouth every 6 (six) hours as needed.     amiodarone (PACERONE) 200 MG tablet Take 1 tablet (200 mg total) by mouth daily. 30 tablet 0   apixaban (ELIQUIS) 2.5 MG TABS tablet Take 1 tablet (2.5 mg total) by mouth 2 (two) times daily. 60 tablet 0    clopidogrel (PLAVIX) 75 MG tablet Take 1 tablet (75 mg total) by mouth daily. 30 tablet 0   empagliflozin (JARDIANCE) 10 MG TABS tablet Held as of 03/15/23 30 tablet 0   losartan (COZAAR) 25 MG tablet Take 0.5 tablets (12.5 mg total) by mouth daily. 45 tablet 3   Multiple Vitamin (MULTIVITAMIN WITH MINERALS) TABS tablet Take 1 tablet by mouth daily.     rosuvastatin (CRESTOR) 10 MG tablet Take 1 tablet (10 mg total) by mouth daily. TAKE 1 TABLET(10 MG) BY MOUTH DAILY Strength: 10 mg 30 tablet 0   spironolactone (ALDACTONE) 25 MG tablet Take 0.5 tablets (12.5 mg total) by mouth daily. 15 tablet 0   torsemide (DEMADEX) 20 MG tablet Take 1 tablet (20 mg total) by mouth daily. 30 tablet 0   traMADol (ULTRAM) 50 MG tablet Take 1 tablet (50 mg total) by mouth 2 (two) times daily. 14 tablet 0   No current facility-administered medications for this visit.     ROS:  See HPI  Physical Exam:  Today's Vitals   03/24/23 1049  BP: 121/75  Pulse: 71  Resp: 20  Temp: (!) 97 F (36.1 C)  TempSrc: Temporal  SpO2: 97%  Weight: 149 lb 3.2 oz (67.7 kg)  Height: 5\' 2"  (1.575 m)   Body mass index is 27.29 kg/m.   Incision:  almost completely healed.  Extremities:  + doppler flow right PT  Assessment/Plan:  This is a 82 y.o. female who is s/p: cardiac catheterization complicated by femoral pseudoaneurysm. Initially this was injected with thrombin however subsequently ruptured. She was brought to the operating room by Dr. Sherral Hammers on 01/15/2023 and underwent hematoma evacuation and profunda repair. She had a prolonged hospital stay requiring several return trips to the operating room for debridement and wound VAC placement in the right groin. She was eventually discharged to inpatient rehabilitation. Home health was arranged for wound VAC changes.   -pt seen with Dr. Karin Lieu.  Her incision is almost completely healed and silver nitrate was used on the small area.  Discussed with pt that she can  shower daily, pat the groin dry and keep dry gauze over wound.  She will return in one month for wound check and then we will see her back in one year with ABI (these will need to be placed at next visit).     Doreatha Massed, [facility] [phone]   Clinic MD:  Karin Lieu

## 2023-03-21 NOTE — Telephone Encounter (Signed)
Patient would like the nurse to call regarding her sleep study because she is not sure if she can still have the study done because she has a pacemaker.  She would like some advice.  Please call patient to discuss further at [phone]

## 2023-03-22 ENCOUNTER — Telehealth: Payer: Self-pay

## 2023-03-22 ENCOUNTER — Other Ambulatory Visit (HOSPITAL_COMMUNITY): Payer: Self-pay

## 2023-03-22 ENCOUNTER — Telehealth: Payer: Self-pay | Admitting: Family Medicine

## 2023-03-22 ENCOUNTER — Ambulatory Visit: Payer: Self-pay

## 2023-03-22 NOTE — Telephone Encounter (Signed)
ATC X1 LVM for patient to call the office back 

## 2023-03-22 NOTE — Telephone Encounter (Signed)
Patient returned call regarding labs,would like a call back  

## 2023-03-22 NOTE — Telephone Encounter (Signed)
Pt called to confirm her wound check appts. She mentioned she had PT yesterday and then noticed some bright red blood spotting this morning. She is unsure if it was due to the PT but it was a small amount and the bleeding stopped pretty quickly. She is due for f/u this week. She is going to keep an eye on it and let us know if anything changes.

## 2023-03-22 NOTE — Patient Instructions (Signed)
Visit Information  Thank you for taking time to visit with me today. Please don't hesitate to contact me if I can be of assistance to you.   Following are the goals we discussed today:  Contact provider to clarify if you should be taking Eliquis Return call to RN case manager regarding medications you are unable to afford. Continue to monitor your blood sugars and weight daily and record. Keep follow up appointments with provider Contact your vascular surgeon's office and confirm follow up visit date and time.  Notify vascular surgeon if signs/ symptoms of infection noted at wound site.    Our next appointment is by telephone on 04/24/23 at 2 pm  Please call the care guide team at [phone] if you need to cancel or reschedule your appointment.   If you are experiencing a Mental Health or Behavioral Health Crisis or need someone to talk to, please call the Suicide and Crisis Lifeline: 988 call 1-800-273-TALK (toll free, 24 hour hotline)  Patient verbalizes understanding of instructions and care plan provided today and agrees to view in MyChart. Active MyChart status and patient understanding of how to access instructions and care plan via MyChart confirmed with patient.     George InaDavina Woodard Steven RN,BSN,CCM [facility] Care Coordination [phone] direct line   Heart Failure Action Plan A heart failure action plan helps you understand what to do when you have symptoms of heart failure. Your action plan is a color-coded plan that lists the symptoms to watch for and indicates what actions to take. If you have symptoms in the red zone, you need medical care right away. If you have symptoms in the yellow zone, you are having problems. If you have symptoms in the Annaliese Saez zone, you are doing well. Follow the plan that was created by you and your health care provider. Review your plan each time you visit your health care provider. Red zone These signs and symptoms mean you should get medical help right  away: You have trouble breathing when resting. You have a dry cough that is getting worse. You have swelling or pain in your legs or abdomen that is getting worse. You suddenly gain more than 2-3 lb (0.9-1.4 kg) in 24 hours, or more than 5 lb (2.3 kg) in a week. This amount may be more or less depending on your condition. You have trouble staying awake or you feel confused. You have chest pain. You do not have an appetite. You pass out. You have worsening sadness or depression. If you have any of these symptoms, call your local emergency services (911 in the [place] right away. Do not drive yourself to the hospital. Yellow zone These signs and symptoms mean your condition may be getting worse and you should make some changes: You have trouble breathing when you are active, or you need to sleep with your head raised on extra pillows to help you breathe. You have swelling in your legs or abdomen. You gain 2-3 lb (0.9-1.4 kg) in 24 hours, or 5 lb (2.3 kg) in a week. This amount may be more or less depending on your condition. You get tired easily. You have trouble sleeping. You have a dry cough. If you have any of these symptoms: Contact your health care provider within the next day. Your health care provider may adjust your medicines. Filip Luten zone These signs mean you are doing well and can continue what you are doing: You do not have shortness of breath. You have very little swelling or no  new swelling. Your weight is stable (no gain or loss). You have a normal activity level. You do not have chest pain or any other new symptoms. Follow these instructions at home: Take over-the-counter and prescription medicines only as told by your health care provider. Weigh yourself daily. Your target weight is __________ lb (__________ kg). Call your health care provider if you gain more than __________ lb (__________ kg) in 24 hours, or more than __________ lb (__________ kg) in a week. Health care  provider name: _____________________________________________________ Health care provider phone number: _____________________________________________________ Eat a heart-healthy diet. Work with a diet and nutrition specialist (dietitian) to create an eating plan that is best for you. Keep all follow-up visits. This is important. Where to find more information American Heart Association: Summary A heart failure action plan helps you understand what to do when you have symptoms of heart failure. Follow the action plan that was created by you and your health care provider. Get help right away if you have any symptoms in the red zone. This information is not intended to replace advice given to you by your health care provider. Make sure you discuss any questions you have with your health care provider. Document Revised: 03/08/2022 Document Reviewed: 07/13/2020 Elsevier Patient Education  2023 Elsevier Inc.  Type 2 Diabetes Mellitus, Self-Care, Adult Caring for yourself after you have been diagnosed with type 2 diabetes (type 2 diabetes mellitus) means keeping your blood sugar (glucose) under control with a balance of: Nutrition. Exercise. Lifestyle changes. Medicines or insulin, if needed. Support from your team of health care providers and others. What are the risks? Having type 2 diabetes can put you at risk for other long-term (chronic) conditions, such as heart disease and kidney disease. Your health care provider may prescribe medicines to help prevent complications from diabetes. How to monitor your blood glucose  Check your blood glucose every day or as often as told by your health care provider. Have your A1C (hemoglobin A1C) level checked two or more times a year, or as often as told by your health care provider. Your health care provider will set personalized treatment goals for you. Generally, the goal of treatment is to maintain the following blood glucose levels: Before meals:  80-130 mg/dL (1.6-1.0 mmol/L). After meals: below 180 mg/dL (10 mmol/L). A1C level: less than 7%. How to manage hyperglycemia and hypoglycemia Hyperglycemia symptoms Hyperglycemia, also called high blood glucose, occurs when blood glucose is too high. Make sure you know the early signs of hyperglycemia, such as: Increased thirst. Hunger. Feeling very tired. Needing to urinate more often than usual. Blurry vision. Hypoglycemia symptoms Hypoglycemia, also called low blood glucose, occurs with a blood glucose level at or below 70 mg/dL (3.9 mmol/L). Diabetes medicines lower your blood glucose and can cause hypoglycemia. The risk for hypoglycemia increases during or after exercise, during sleep, during illness, and when skipping meals or not eating for a long time (fasting). It is important to know the symptoms of hypoglycemia and treat it right away. Always have a 15-gram rapid-acting carbohydrate snack with you to treat low blood glucose. Family members and close friends should also know the symptoms and understand how to treat hypoglycemia, in case you are not able to treat yourself. Symptoms may include: Hunger. Anxiety. Sweating and feeling clammy. Dizziness or feeling light-headed. Sleepiness. Increased heart rate. Irritability. Tingling or numbness around the mouth, lips, or tongue. Restless sleep. Severe hypoglycemia is when your blood glucose level is at or below 54 mg/dL (  3 mmol/L). Severe hypoglycemia is an emergency. Do not wait to see if the symptoms will go away. Get medical help right away. Call your local emergency services (911 in the [place] Do not drive yourself to the hospital. If you have severe hypoglycemia and you cannot eat or drink, you may need glucagon. A family member or close friend should learn how to check your blood glucose and how to give you glucagon. Ask your health care provider if you need to have an emergency glucagon kit available. Follow these instructions  at home: Medicines Take prescribed insulin or diabetes medicines as told by your health care provider. Do not run out of insulin or other diabetes medicines. Plan ahead so you always have these available. If you use insulin, adjust your dosage based on your physical activity and what foods you eat. Your health care provider will tell you how to adjust your dosage. Take over-the-counter and prescription medicines only as told by your health care provider. Eating and drinking  What you eat and drink affects your blood glucose and your insulin dosage. Making good choices helps to control your diabetes and prevent other health problems. A healthy meal plan includes eating lean proteins, complex carbohydrates, fresh fruits and vegetables, low-fat dairy products, and healthy fats. Make an appointment to see a registered dietitian to help you create an eating plan that is right for you. Make sure that you: Follow instructions from your health care provider about eating or drinking restrictions. Drink enough fluid to keep your urine pale yellow. Keep a record of the carbohydrates that you eat. Do this by reading food labels and learning the standard serving sizes of foods. Follow your sick-day plan whenever you cannot eat or drink as usual. Make this plan in advance with your health care provider.  Activity Stay active. Exercise regularly, as told by your health care provider. This may include: Stretching and doing strength exercises, such as yoga or weight lifting, two or more times a week. Doing 150 minutes or more of moderate-intensity or vigorous-intensity exercise each week. This could be brisk walking, biking, or water aerobics. Spread out your activity over 3 or more days of the week. Do not go more than 2 days in a row without doing some kind of physical activity. When you start a new exercise or activity, work with your health care provider to adjust your insulin, medicines, or food intake as  needed. Lifestyle Do not use any products that contain nicotine or tobacco. These products include cigarettes, chewing tobacco, and vaping devices, such as e-cigarettes. If you need help quitting, ask your health care provider. If you drink alcohol and your health care provider says that it is safe for you: Limit how much you have to: 0-1 drink a day for women who are not pregnant. 0-2 drinks a day for men. Know how much alcohol is in your drink. In the [place] one drink equals one 12 oz bottle of beer (355 mL), one 5 oz glass of wine (148 mL), or one 1 oz glass of hard liquor (44 mL). Learn to manage stress. If you need help with this, ask your health care provider. Take care of your body  Keep your immunizations up to date. In addition to getting vaccinations as told by your health care provider, it is recommended that you get vaccinated against the following illnesses: The flu (influenza). Get a flu shot every year. Pneumonia. Hepatitis B. Schedule an eye exam soon after your diagnosis, and then  one time every year after that. Check your skin and feet every day for cuts, bruises, redness, blisters, or sores. Schedule a foot exam with your health care provider once every year. Brush your teeth and gums two times a day, and floss one or more times a day. Visit your dentist one or more times every 6 months. Maintain a healthy weight. General instructions Share your diabetes management plan with people in your workplace, school, and household. Carry a medical alert card or wear medical alert jewelry. Keep all follow-up visits. This is important. Questions to ask your health care provider Should I meet with a certified diabetes care and education specialist? Where can I find a support group for people with diabetes? Where to find more information For help and guidance and for more information about diabetes, please visit: American Diabetes Association (ADA): www.diabetes.org American  Association of Diabetes Care and Education Specialists (ADCES): www.diabeteseducator.org International Diabetes Federation (IDF): DCOnly.dk Summary Caring for yourself after you have been diagnosed with type 2 diabetes (type 2 diabetes mellitus) means keeping your blood sugar (glucose) under control with a balance of nutrition, exercise, lifestyle changes, and medicine. Check your blood glucose every day, as often as told by your health care provider. Having diabetes can put you at risk for other long-term (chronic) conditions, such as heart disease and kidney disease. Your health care provider may prescribe medicines to help prevent complications from diabetes. Share your diabetes management plan with people in your workplace, school, and household. Keep all follow-up visits. This is important. This information is not intended to replace advice given to you by your health care provider. Make sure you discuss any questions you have with your health care provider. Document Revised: 04/28/2021 Document Reviewed: 04/28/2021 Elsevier Patient Education  2023 [employer].

## 2023-03-22 NOTE — Telephone Encounter (Signed)
Pt. Calling back to speak with nurse

## 2023-03-22 NOTE — Telephone Encounter (Signed)
Spoke with patient. She advised someone answered her questions yesterday. NFN

## 2023-03-22 NOTE — Patient Outreach (Signed)
Care Coordination   Initial Visit Note   03/22/2023 Name: Natasha Woodard MRN: [id number] DOB: 1941/11/07  Natasha Woodard is a 82 y.o. year old female who sees [name], MD for primary care. I spoke with  [name] by phone today.  What matters to the patients health and wellness today?   Patient verbalized being hospitalized in January 2024 for MI. She reports extensive hospital stay due to complications and procedures.  Patient reports having inpatient rehab from 02/19/23 to 02/28/23.  She states she is currently receiving home health SN for wound care, PT, and OT services with [facility].  Patient reports having post hospital discharge follow up visits with PCP on 03/13/23, vascular surgeon on 03/09/23, and pulmonologist on 03/14/23.  Patient reports using a rollator for ambulation. She states her wound vac was removed and she is now having dry dressing changes. Patient reports her wound was bleeding some this morning and her husband changed the dressing. She reports having follow up visit with vascular surgeon on tomorrow 03/23/23 for wound check.  Patient reports blood sugars have ranged from 84-low 100's. She reports today's blood sugar was 84. Denies any hypoglycemic symptoms.  Patient reports having shaking in her hands and occasionally in her legs since being discharged from the hospital. She states she made her primary care provider aware of this.    Goals Addressed             This Visit's Progress    post hospital follow up/ education / management of health conditions.       Interventions Today    Flowsheet Row Most Recent Value  Chronic Disease   Chronic disease during today's visit Diabetes, Congestive Heart Failure (CHF), Other  [Groin wound, OSA.  Discussed ongoing follow up with RNCM for care coordination.]  General Interventions   General Interventions Discussed/Reviewed General Interventions Discussed, Doctor Visits, Communication with  [Evaluation  of current treatment plan for DM, HF, wound care and patients adherence to plan as established by provider. Assessed for home monitored blood sugars and weight. Assessed for patients wound status and ongoing follow up from home health nurse.]  Doctor Visits Discussed/Reviewed Doctor Visits Discussed  [Discussed patients most recent follow up visits with primary provider, vascular surgeon, and pulmonologist. Reviewed scheduled/ upcoming provider visits. Advised to call vascular provider office to clarify/ confirm next follow up appointment.]  Communication with Pharmacists  [facility] with practice pharmacist regarding medication assistance.  Requested pharmacist outreach to patient for follow up.]  Education Interventions   Education Provided Provided Printed Education, Provided Education  [Education article on DM, HF sent to patient in Mychart.  Discussed signs/ symptoms of heart failure, hypoglycemia, and infection.]  Provided Verbal Education On Blood Sugar Monitoring, When to see the doctor, Other  [Advised to continue monitoring blood sugars daily. Advised to notify provider of 3 lb weight gain overnight or 5 lbs in a week. Assessed if patient had restarted CPAP as recommended by provider.]  Pharmacy Interventions   Pharmacy Dicussed/Reviewed Pharmacy Topics Discussed, Affording Medications  [medications reviewed and compliance discussed. Patient advised to contact provider to clarify if Eliquis should be taken.]  Safety Interventions   Safety Discussed/Reviewed Fall Risk  [Assessed patients ambulatory status. Advised to use ambulatory device as recommended by provider/ PT provider.]              SDOH assessments and interventions completed:  Yes  SDOH Interventions Today    Flowsheet Row Most Recent  Value  SDOH Interventions   Food Insecurity Interventions Intervention Not Indicated  Housing Interventions Intervention Not Indicated  Transportation Interventions Intervention Not  Indicated        Care Coordination Interventions:  Yes, provided   Follow up plan: Follow up call scheduled for 04/24/23    Encounter Outcome:  Pt. Visit Completed   George Ina RN,BSN,CCM [facility] Care Coordination [phone] direct line

## 2023-03-23 ENCOUNTER — Ambulatory Visit: Payer: Medicare Other

## 2023-03-23 ENCOUNTER — Other Ambulatory Visit: Payer: Self-pay | Admitting: Family Medicine

## 2023-03-23 ENCOUNTER — Telehealth: Payer: Self-pay | Admitting: Family Medicine

## 2023-03-23 DIAGNOSIS — R300 Dysuria: Secondary | ICD-10-CM

## 2023-03-23 NOTE — Addendum Note (Signed)
Addended by: [name] on: 03/23/2023 01:58 PM   Modules accepted: Orders

## 2023-03-23 NOTE — Telephone Encounter (Signed)
See lab note for further documentation.  

## 2023-03-23 NOTE — Telephone Encounter (Signed)
I would still hold jardiance and metformin for now, at least until she has f/u labs done.    If she is SOB, then would be okay to take an extra dose of torsemide today.    If her weight continues to increase, then let me know.    I would like to recheck her urine at the lab visit tomorrow. I put in the orders. Thanks.

## 2023-03-23 NOTE — Telephone Encounter (Signed)
Called patient reviewed all information and repeated back to me. Will call if any questions.  She denies any sob at all and will let us know if any more wt increase

## 2023-03-23 NOTE — Telephone Encounter (Signed)
Patient called in and stated that since Monday her blood sugar has been between 84-192 but right now its 146. She stated that she isn't taking Metformin or Jardiance per his instructions. She also stated that in the past 5 days her weight has went from 142 to 148. Thank you!

## 2023-03-24 ENCOUNTER — Other Ambulatory Visit (INDEPENDENT_AMBULATORY_CARE_PROVIDER_SITE_OTHER): Payer: Medicare Other

## 2023-03-24 ENCOUNTER — Ambulatory Visit (INDEPENDENT_AMBULATORY_CARE_PROVIDER_SITE_OTHER): Payer: Medicare Other | Admitting: Physician Assistant

## 2023-03-24 VITALS — BP 121/75 | HR 71 | Temp 97.0°F | Resp 20 | Ht 62.0 in | Wt 149.2 lb

## 2023-03-24 DIAGNOSIS — T8189XA Other complications of procedures, not elsewhere classified, initial encounter: Secondary | ICD-10-CM

## 2023-03-24 DIAGNOSIS — I5032 Chronic diastolic (congestive) heart failure: Secondary | ICD-10-CM

## 2023-03-24 DIAGNOSIS — R300 Dysuria: Secondary | ICD-10-CM

## 2023-03-24 NOTE — Addendum Note (Signed)
Addended by: Izaac Reisig J on: 03/24/2023 03:56 PM   Modules accepted: Orders  

## 2023-03-24 NOTE — Addendum Note (Signed)
Addended by: Alvina Chou on: 03/24/2023 03:56 PM   Modules accepted: Orders

## 2023-03-25 LAB — URINALYSIS, ROUTINE W REFLEX MICROSCOPIC
Bacteria, UA: NONE SEEN /HPF
Bilirubin Urine: NEGATIVE
Hgb urine dipstick: NEGATIVE
Ketones, ur: NEGATIVE
Leukocytes,Ua: NEGATIVE
Nitrite: NEGATIVE
RBC / HPF: NONE SEEN /HPF (ref 0–2)
Specific Gravity, Urine: 1.018 (ref 1.001–1.035)
WBC, UA: NONE SEEN /HPF (ref 0–5)
pH: 5.5 (ref 5.0–8.0)

## 2023-03-25 LAB — BASIC METABOLIC PANEL
BUN/Creatinine Ratio: 19 (calc) (ref 6–22)
BUN: 34 mg/dL — ABNORMAL HIGH (ref 7–25)
CO2: 26 mmol/L (ref 20–32)
Calcium: 10 mg/dL (ref 8.6–10.4)
Chloride: 101 mmol/L (ref 98–110)
Creat: 1.81 mg/dL — ABNORMAL HIGH (ref 0.60–0.95)
Glucose, Bld: 94 mg/dL (ref 65–99)
Potassium: 4.2 mmol/L (ref 3.5–5.3)
Sodium: 139 mmol/L (ref 135–146)

## 2023-03-25 LAB — URINE CULTURE
MICRO NUMBER:: 14818188
SPECIMEN QUALITY:: ADEQUATE

## 2023-03-25 LAB — MICROSCOPIC MESSAGE

## 2023-03-27 ENCOUNTER — Encounter
Payer: Medicare Other | Attending: Physical Medicine and Rehabilitation | Admitting: Physical Medicine and Rehabilitation

## 2023-03-27 VITALS — BP 134/72 | HR 37 | Ht 62.0 in | Wt 149.8 lb

## 2023-03-27 DIAGNOSIS — R001 Bradycardia, unspecified: Secondary | ICD-10-CM | POA: Diagnosis not present

## 2023-03-27 DIAGNOSIS — I724 Aneurysm of artery of lower extremity: Secondary | ICD-10-CM | POA: Diagnosis not present

## 2023-03-27 DIAGNOSIS — I2510 Atherosclerotic heart disease of native coronary artery without angina pectoris: Secondary | ICD-10-CM | POA: Diagnosis not present

## 2023-03-27 DIAGNOSIS — I429 Cardiomyopathy, unspecified: Secondary | ICD-10-CM | POA: Diagnosis not present

## 2023-03-27 DIAGNOSIS — D631 Anemia in chronic kidney disease: Secondary | ICD-10-CM | POA: Diagnosis not present

## 2023-03-27 DIAGNOSIS — N179 Acute kidney failure, unspecified: Secondary | ICD-10-CM | POA: Insufficient documentation

## 2023-03-27 DIAGNOSIS — I472 Ventricular tachycardia, unspecified: Secondary | ICD-10-CM | POA: Diagnosis not present

## 2023-03-27 DIAGNOSIS — I252 Old myocardial infarction: Secondary | ICD-10-CM | POA: Diagnosis not present

## 2023-03-27 DIAGNOSIS — I97638 Postprocedural hematoma of a circulatory system organ or structure following other circulatory system procedure: Secondary | ICD-10-CM | POA: Diagnosis not present

## 2023-03-27 DIAGNOSIS — B3731 Acute candidiasis of vulva and vagina: Secondary | ICD-10-CM | POA: Diagnosis not present

## 2023-03-27 DIAGNOSIS — L304 Erythema intertrigo: Secondary | ICD-10-CM | POA: Diagnosis not present

## 2023-03-27 DIAGNOSIS — R911 Solitary pulmonary nodule: Secondary | ICD-10-CM | POA: Diagnosis not present

## 2023-03-27 DIAGNOSIS — G4730 Sleep apnea, unspecified: Secondary | ICD-10-CM | POA: Diagnosis not present

## 2023-03-27 DIAGNOSIS — H6123 Impacted cerumen, bilateral: Secondary | ICD-10-CM | POA: Diagnosis not present

## 2023-03-27 DIAGNOSIS — T81718D Complication of other artery following a procedure, not elsewhere classified, subsequent encounter: Secondary | ICD-10-CM

## 2023-03-27 DIAGNOSIS — K219 Gastro-esophageal reflux disease without esophagitis: Secondary | ICD-10-CM | POA: Diagnosis not present

## 2023-03-27 DIAGNOSIS — I5023 Acute on chronic systolic (congestive) heart failure: Secondary | ICD-10-CM | POA: Diagnosis not present

## 2023-03-27 DIAGNOSIS — E785 Hyperlipidemia, unspecified: Secondary | ICD-10-CM | POA: Diagnosis not present

## 2023-03-27 DIAGNOSIS — G4700 Insomnia, unspecified: Secondary | ICD-10-CM | POA: Diagnosis not present

## 2023-03-27 DIAGNOSIS — R5381 Other malaise: Secondary | ICD-10-CM | POA: Diagnosis not present

## 2023-03-27 DIAGNOSIS — R112 Nausea with vomiting, unspecified: Secondary | ICD-10-CM | POA: Insufficient documentation

## 2023-03-27 DIAGNOSIS — M6529 Calcific tendinitis, multiple sites: Secondary | ICD-10-CM | POA: Diagnosis not present

## 2023-03-27 DIAGNOSIS — I5032 Chronic diastolic (congestive) heart failure: Secondary | ICD-10-CM | POA: Diagnosis not present

## 2023-03-27 DIAGNOSIS — I480 Paroxysmal atrial fibrillation: Secondary | ICD-10-CM | POA: Diagnosis not present

## 2023-03-27 DIAGNOSIS — I340 Nonrheumatic mitral (valve) insufficiency: Secondary | ICD-10-CM | POA: Diagnosis not present

## 2023-03-27 DIAGNOSIS — I130 Hypertensive heart and chronic kidney disease with heart failure and stage 1 through stage 4 chronic kidney disease, or unspecified chronic kidney disease: Secondary | ICD-10-CM | POA: Diagnosis not present

## 2023-03-27 DIAGNOSIS — K449 Diaphragmatic hernia without obstruction or gangrene: Secondary | ICD-10-CM | POA: Diagnosis not present

## 2023-03-27 DIAGNOSIS — Q809 Congenital ichthyosis, unspecified: Secondary | ICD-10-CM | POA: Diagnosis not present

## 2023-03-27 DIAGNOSIS — E1122 Type 2 diabetes mellitus with diabetic chronic kidney disease: Secondary | ICD-10-CM | POA: Diagnosis not present

## 2023-03-27 DIAGNOSIS — N1832 Chronic kidney disease, stage 3b: Secondary | ICD-10-CM | POA: Diagnosis not present

## 2023-03-27 MED ORDER — ONDANSETRON HCL 4 MG PO TABS: 4.0000 mg | ORAL_TABLET | Freq: Three times a day (TID) | ORAL | 0 refills | Status: DC | PRN

## 2023-03-27 NOTE — Patient Instructions (Signed)
Please try to drink 6 to 812 ounce cups of water per day, if you cannot keep down regular water Pedialyte or Gatorade is acceptable but I would not replace this with a soda.  Please monitor your blood sugars closely for any low readings under 60.  If you have multiples of these, please call your primary care provider.  Try to drink orange juice and eat something with some protein if your blood sugars go low.  I have prescribed you a temporary prescription for Zofran, number 20 tablets.  You can take this once every 8 hours for nausea or vomiting.   You can try something over-the-counter first like Pepto-Bismol or Tums before taking food or medication to help keep it down; if this does not work, use the Zofran to help you tolerate your pills, food, and water.  It is very important that you keep hydrated.  I will message your PCP in regards to your low heart rate today.  If you start to feel symptomatic, dizzy, feverish, or lightheaded, please go to urgent care or the ER.  Continue with your therapies and following with Dr. Sherral Hammers.  You can follow-up with me as needed.

## 2023-03-27 NOTE — Progress Notes (Unsigned)
Subjective:    Patient ID: Natasha Woodard, female    DOB: Aug 25, 1941, 82 y.o.   MRN: [id number]  HPI\  Brief HPI:   Natasha Woodard is a 82 y.o. female who presented on January 10, 2023 with shortness of breath and chest pain. She was admitted with an acute lateral wall MI and was also found to have significant akinesis/hypokinesis with severe mitral regurgitation.  An IABP was placed in the Cath Lab.  Unfortunately, overnight she developed a hematoma to the IABP access site with associated limb ischemia and the IABP was removed.  Patient was seen by vascular surgery for assessment of a right femoral pseudoaneurysm and a thrombin injection was performed.  On 01/15/2023 patient developed swelling in her right groin due to a significant hematoma which rapidly extended down the leg.  Patient underwent emergent right groin exploration and hematoma evacuation with right profunda femoris repair by vascular surgery.  On 01/19/2023 patient went into VT arrest in 01/20/2019 for a transvenous pacemaker placed with an ICD placed on 2/12.  On 01/24/2023 patient had her staples removed and a wound VAC replaced in the right groin area due to incisional breakdown and underlying tissue necrosis.  She went to the OR on 2/15 for washout, debridement, sartorious muscle flap and VAC replacement by Dr. Karin Lieu.  She has been followed essentially weekly by vascular surgery for VAC changes as well as wound washouts in the OR, the most recent of which was on February 06, 2023.  VAC changes have been converted to bedside and now wound care ostomy nurse is managing the VAC changes which are Monday and Thursdays. Wound cultures showed polymicrobial growth with moderate Morganella, Enterococcus and Pseudomonas. Pt has completed a course of IV Zosyn.      Hospital Course: Natasha Woodard was admitted to rehab 02/19/2023 for inpatient therapies to consist of PT, ST and OT at least three hours five days a week. Past admission  physiatrist, therapy team and rehab RN have worked together to provide customized collaborative inpatient rehab.  Wound VAC changes continued bi-weekly. Vascular surgery continued to follow. Treatment for vaginal candidiasis completed. Attention to skin care/MASD. Improved. Creatinine down to 1.32. Right vaginal boil noted on 3/19. No signs or symptoms of worsening infection. Advised patient that these generally self resolve, can manage at home with intermittent warm compress until it drains on its own. If it does not resolve or get worse, can follow-up with PCP for in office I&D.      Blood pressures were monitored on TID basis and remained stable. Heart meds including amiodarone, Demadex, aldactone and Cozaar continued. SSI used for tighter control. OK to resume metformin and Jardiance at discharge.   Rehab course: During patient's stay in rehab weekly team conferences were held to monitor patient's progress, set goals and discuss barriers to discharge. At admission, patient required min with mobility and  max with basic self-care skills and IADL.    She  has had improvement in activity tolerance, balance, postural control as well as ability to compensate for deficits. She has had improvement in functional use RUE/LUE  and RLE/LLE as well as improvement in awareness  Interval Hx:  - Therapies: Ongoing, hasn't done in 1-2 weeks because she has been feeling bad. She likes who she is seeing and feels they are benefitting her.    - Follow ups: Dr. Sherral Hammers: " -pt seen with Dr. Karin Lieu.  Her incision is almost completely healed and silver nitrate was used  on the small area.  Discussed with pt that she can shower daily, pat the groin dry and keep dry gauze over wound.  She will return in one month for wound check and then we will see her back in one year with ABI (these will need to be placed at next visit).  "   - Falls: None; has been doing well with the rollator   - DME:HH and husband have been doing  dressing  changes sinvce she got off the Vac; she has f/u with Dr. Sherral Hammers in 1 month.    - Medications: Tramadol 50 mg  - is taking twice a day, AM and PM.    - Other concerns: Vomitting started 3-4 days ago; has had no appetitie and cannot keep anything down. Denies ortho stasis. She has been unable to keep fluids down other than small amounts of gingerale. No fevers, has been having nondiarrheal stools without blood. She did vomit up all her medications once;   - She says she has been urinating "very slowly" based on how much she drinks, which is new. No urgency or frequency. Has some mild incontinence but this is old. Does feel like she empties completely when she goes.   Pain Inventory Average Pain 3 Pain Right Now 5 My pain is dull  In the last 24 hours, has pain interfered with the following? General activity 2 Relation with others 2 Enjoyment of life 8 What TIME of day is your pain at its worst? daytime Sleep (in general) Fair  Pain is worse with: walking, inactivity, and standing Pain improves with: rest and therapy/exercise Relief from Meds:  .  walk with assistance use a walker how many minutes can you walk? 10 ability to climb steps?  yes do you drive?  no  retired I need assistance with the following:  household duties and shopping  weakness trouble walking  Hospital f/u  Any changes since last visit?  no    Family History  Problem Relation Age of Onset   Heart disease Mother        s/p pacemaker   Diabetes Mother    Heart disease Father    Diabetes Father    Heart disease Brother        MI   Cancer Brother        Lung   Hypertension Other    Colon cancer Neg Hx    Breast cancer Neg Hx    Social History   Socioeconomic History   Marital status: Married    Spouse name: Not on file   Number of children: 2   Years of education: Not on file   Highest education level: Not on file  Occupational History    Employer: RETIRED  Tobacco Use    Smoking status: Former    Years: 28    Types: Cigarettes    Quit date: 12/12/1993    Years since quitting: 29.3    Passive exposure: Never   Smokeless tobacco: Never  Vaping Use   Vaping Use: Never used  Substance and Sexual Activity   Alcohol use: No    Alcohol/week: 0.0 standard drinks of alcohol   Drug use: No   Sexual activity: Never  Other Topics Concern   Not on file  Social History Narrative   Two kids, two grandchildren, two great grandchildren.    Married 1969   Retired.    Social Determinants of Health   Financial Resource Strain: Low Risk  (05/12/2022)  Overall Financial Resource Strain (CARDIA)    Difficulty of Paying Living Expenses: Not hard at all  Food Insecurity: No Food Insecurity (03/22/2023)   Hunger Vital Sign    Worried About Running Out of Food in the Last Year: Never true    Ran Out of Food in the Last Year: Never true  Transportation Needs: No Transportation Needs (03/22/2023)   PRAPARE - Administrator, Civil Service (Medical): No    Lack of Transportation (Non-Medical): No  Physical Activity: Sufficiently Active (05/12/2022)   Exercise Vital Sign    Days of Exercise per Week: 6 days    Minutes of Exercise per Session: 60 min  Stress: No Stress Concern Present (05/12/2022)   [employer] [employer] - Occupational Stress Questionnaire    Feeling of Stress : Not at all  Social Connections: Not on file   Past Surgical History:  Procedure Laterality Date   ABDOMINAL HYSTERECTOMY  1975   TAH-BSO   ABSCESS DRAINAGE  1972   right breast   APPLICATION OF WOUND VAC Right 01/15/2023   Procedure: APPLICATION OF INCISIONAL WOUND VAC;  Surgeon: Victorino Sparrow, MD;  Location: [facility] OR;  Service: Vascular;  Laterality: Right;   APPLICATION OF WOUND VAC Right 01/26/2023   Procedure: APPLICATION OF WOUND VAC RIGHT GROIN WASHOUT AND SARTORIUS MUSCLE FLAP;  Surgeon: Victorino Sparrow, MD;  Location: [facility] OR;  Service: Vascular;  Laterality:  Right;   ARTERY REPAIR Right 01/15/2023   Procedure: RIGHT PROFUNDA ARTERY REPAIR WITH 2000G MYRIAD MORCELLS;  Surgeon: Victorino Sparrow, MD;  Location: [facility] OR;  Service: Vascular;  Laterality: Right;   BREAST BIOPSY Left 01/14/2009   Stereo- Benign   CARDIAC MRI  01/2018   Difficult to interpret 2/2 PVCs. Normal LV size - EF ~27% with diffuse HK. Mild RV dilation - normal fxn. -- NO MYOCARDIAL LGI => no definitive evidence of prior MI, Infiltrative Dz or Myocarditis -- suspect NICM, possibly related to PVCs.   CARPAL TUNNEL RELEASE Right 07/02/2014   Procedure: RIGHT CARPAL TUNNEL RELEASE;  Surgeon: Nicki Reaper, MD;  Location: [facility];  Service: Orthopedics;  Laterality: Right;   CARPAL TUNNEL RELEASE Left 06/11/2015   Procedure: LEFT CARPAL TUNNEL RELEASE;  Surgeon: Cindee Salt, MD;  Location: [facility];  Service: Orthopedics;  Laterality: Left;  REGIONAL/FAB   COLONOSCOPY     corn removal  1968   right   CORONARY STENT INTERVENTION N/A 01/10/2023   Procedure: CORONARY STENT INTERVENTION;  Surgeon: Lennette Bihari, MD;  Location: [facility] INVASIVE CV LAB;  Service: Cardiovascular;  Laterality: N/A;   GROIN DEBRIDEMENT Right 02/06/2023   Procedure: RIGHT GROIN DEBRIDEMENT WITH WOUND VAC CHANGE;  Surgeon: Victorino Sparrow, MD;  Location: [facility] OR;  Service: Vascular;  Laterality: Right;   HEMATOMA EVACUATION Right 01/15/2023   Procedure: EVACUATION HEMATOMA RIGHT GROIN;  Surgeon: Victorino Sparrow, MD;  Location: [facility] OR;  Service: Vascular;  Laterality: Right;   Holter Monitor  06/2017   ~17,000 PVC beats - majority were singlets, some couplets. 44 brief 3-7 beat runs of NSVT. Also noted were less frequent PACs with 11 runs (longest 15 beats)   IABP INSERTION Right 01/10/2023   Procedure: IABP Insertion;  Surgeon: Lennette Bihari, MD;  Location: [facility] INVASIVE CV LAB;  Service: Cardiovascular;  Laterality: Right;   ICD IMPLANT N/A 01/23/2023   Procedure: ICD IMPLANT;  Surgeon:  Marinus Maw, MD;  Location: [facility] INVASIVE  CV LAB;  Service: Cardiovascular;  Laterality: N/A;   INCISION AND DRAINAGE OF WOUND Right 01/31/2023   Procedure: IRRIGATION AND DEBRIDEMENT RIGHT GROIN WOUND AND WOUND VAC CHANGE;  Surgeon: Victorino Sparrow, MD;  Location: [facility] OR;  Service: Vascular;  Laterality: Right;   OPEN REDUCTION INTERNAL FIXATION (ORIF) DISTAL RADIAL FRACTURE Left 07/21/2017   Procedure: OPEN REDUCTION INTERNAL FIXATION (ORIF) LEFT DISTAL RADIAL FRACTURE;  Surgeon: Sheral Apley, MD;  Location: [facility] OR;  Service: Orthopedics;  Laterality: Left;   RIGHT/LEFT HEART CATH AND CORONARY ANGIOGRAPHY N/A 06/30/2017   Procedure: Right/Left Heart Cath and Coronary Angiography;  Surgeon: Marykay Lex, MD;  Location: [facility] INVASIVE CV LAB:  pRCA 55%, pCx 40%, OM1 45%, mCx 50%, D2 50% - LVEF 25-35%. Moderately elevated LVEDP(26 mmHg with PCWP 16 mmHg).  FICK CO/CI: 4.47/2.48. PA pressures 47/14 mmHg with a mean of 27 mm.   RIGHT/LEFT HEART CATH AND CORONARY ANGIOGRAPHY N/A 01/10/2023   Procedure: RIGHT/LEFT HEART CATH AND CORONARY ANGIOGRAPHY;  Surgeon: Lennette Bihari, MD;  Location: [facility] INVASIVE CV LAB;  Service: Cardiovascular;  Laterality: N/A;   ROTATOR CUFF REPAIR     left shoulder   TEMPORARY PACEMAKER N/A 01/19/2023   Procedure: TEMPORARY PACEMAKER;  Surgeon: Dolores Patty, MD;  Location: [facility] INVASIVE CV LAB;  Service: Cardiovascular;  Laterality: N/A;   TRANSTHORACIC ECHOCARDIOGRAM  06/2017   EF 25 and 30%. GR 1 DD. Mild diastolic dysfunction with elevated LVEDP. Mild valvular disease.   TRANSTHORACIC ECHOCARDIOGRAM  11'18, 6/'19    a) EF remains 30-35%.  Diffuse hypokinesis noted.  Severe LA dilation; b) Improved EF 35-40%.  Diffuse HK.  GR 1 DD.  Moderate TR.  Mild RV dilation.   TRANSTHORACIC ECHOCARDIOGRAM  01/28/2022   Follow-up echo: EF 50 to 55%.  No RWMA.  GRII DD.  Severe LA dilation.  Mild RA dilation with normal PAP and RAP.  Mild to moderate TR (no significant change  noted)   Zio patch  08/2022   3-day: Currently NSR (HR range 47-91 with average 64 bpm) 1 F AVB with IVCD/BBB.  Rare isolated PACs (<1%).  Frequent PVCs (6.1% with rare couplets and triplets.  Also bigeminy.  2 atrial runs 8 beats 5 2 seconds.  No sustained arrhythmias.   ZIO Patch 14 d Event Monitor  10/2018   Results as below < 1% PVC.  (Notably improved after starting amiodarone)   Past Medical History:  Diagnosis Date   AICD (automatic cardioverter/defibrillator) present    Calcific tendonitis 2008   Treatment of left leg   Cardiomyopathy 06/2017   a) Echo: EF 25-30%. GR 1-2 DD w/ elevated LVEDP. Mild valvular Dz; b) Cardiac MRI 2/'19: frequent PVCs (diffiuclt to interpret) - EF ~27% w/ diffuse HK.  No evidence of infarct, infiltrative Dz or myocarditis. -- ? if related to PVCs. c) f/u Echo 6/'19: EF 35-40%. Gr 1 DD. Diffuse HK.-> d) Jan 2020 EF 50 to 55%.  Mod MAC, mod LAE.  Ao Sclerosis; e) Echo 1/'21 - EF 60-65%, Gr II DD. Mild Mod MR.    Chronic combined systolic and diastolic CHF, NYHA class 2 and ACA/AHA stage C    Now essentially resolved, back to simply diastolic CHF   Coronary artery disease, non-occlusive    mild-moderate CAD 06/30/17 cath   CTS (carpal tunnel syndrome)    Diabetes mellitus    Type II   Dysrhythmia    patient said that she cant remember what it is  Frequent unifocal PVCs 01/2018   Event Monitor: NSR, W/ frequent multifocal PVCs (9%-down from 30% prior to amiodarone) and PACs.  Nighttime bradycardia suggestive of OSA.';  Zio patch September 2023: PVC burden now 6.1%.   Hiatal hernia    with reflux   Hyperlipidemia    Statin intolerant   Hypertension    Ichthyosis congenita    Intermittent claudication of both lower extremities due to atherosclerosis 08/22/2021   LEA Dopplers 09/09/2021:  Right: Total occlusion noted in the superficial femoral artery. Atherosclerosis noted throughout extremity, see note above. Three vessel runoff.  Left: Total occlusion  noted in the superficial femoral artery and/or popliteal artery. Total occlusion noted in the distal anterior tibial artery. Athereosclerosis noted throughout extremity.    NSVD (normal spontaneous vaginal delivery) 1971 & 1972   Obesity    Plantar fasciitis    Left   Presence of permanent cardiac pacemaker    Pulmonary nodule    Imaged multiple times and benign appearing   Sleep apnea    Wears glasses    BP 134/72   Pulse (!) 37   Ht 5\' 2"  (1.575 m)   Wt 149 lb 12.8 oz (67.9 kg)   SpO2 96%   BMI 27.40 kg/m   Opioid Risk Score:   Fall Risk Score:  `1  Depression screen [facility] 2/9     03/27/2023   10:57 AM 03/13/2023    2:44 PM 05/12/2022    4:18 PM 04/08/2021    8:21 AM 11/12/2019    3:07 PM 11/15/2018    2:49 PM 11/06/2018    2:42 PM  Depression screen PHQ 2/9  Decreased Interest 1 0 0 0 0 0 0  Down, Depressed, Hopeless 1 0 0 0 0 0 0  PHQ - 2 Score 2 0 0 0 0 0 0  Altered sleeping 2 0  0 0    Tired, decreased energy 1 0  0 0  0  Change in appetite 1 0  0 0  0  Feeling bad or failure about yourself  0 0  0 0  0  Trouble concentrating 0 0  0 0  0  Moving slowly or fidgety/restless 0 0  0 0  0  Suicidal thoughts 0 0  0 0  0  PHQ-9 Score 6 0  0 0    Difficult doing work/chores Somewhat difficult Not difficult at all  Not difficult at all Not difficult at all  Not difficult at all      Review of Systems  Musculoskeletal:  Positive for back pain.       Bilateral shin pain  All other systems reviewed and are negative.      Objective:   Physical Exam  Resolution of intertrigo in bilateral pannus Former wound VAC site healing well, covered in wet gauze, clean wound base with approximating edges. Strength 4 out of 5 throughout.  Skin appears wrinkled in arms, legs. Abdomen nontender, nondistended, bowel sounds normoactive in all 4 quadrants.      Assessment & Plan:

## 2023-03-28 ENCOUNTER — Encounter: Payer: Self-pay | Admitting: Family Medicine

## 2023-03-28 ENCOUNTER — Ambulatory Visit (INDEPENDENT_AMBULATORY_CARE_PROVIDER_SITE_OTHER): Payer: Medicare Other | Admitting: Family Medicine

## 2023-03-28 VITALS — BP 120/82 | HR 69 | Temp 97.3°F | Ht 62.0 in | Wt 151.0 lb

## 2023-03-28 DIAGNOSIS — G4700 Insomnia, unspecified: Secondary | ICD-10-CM | POA: Diagnosis not present

## 2023-03-28 DIAGNOSIS — I429 Cardiomyopathy, unspecified: Secondary | ICD-10-CM | POA: Diagnosis not present

## 2023-03-28 DIAGNOSIS — Q809 Congenital ichthyosis, unspecified: Secondary | ICD-10-CM | POA: Diagnosis not present

## 2023-03-28 DIAGNOSIS — N1832 Chronic kidney disease, stage 3b: Secondary | ICD-10-CM | POA: Diagnosis not present

## 2023-03-28 DIAGNOSIS — I2510 Atherosclerotic heart disease of native coronary artery without angina pectoris: Secondary | ICD-10-CM | POA: Diagnosis not present

## 2023-03-28 DIAGNOSIS — D631 Anemia in chronic kidney disease: Secondary | ICD-10-CM | POA: Diagnosis not present

## 2023-03-28 DIAGNOSIS — R1111 Vomiting without nausea: Secondary | ICD-10-CM

## 2023-03-28 DIAGNOSIS — E785 Hyperlipidemia, unspecified: Secondary | ICD-10-CM | POA: Diagnosis not present

## 2023-03-28 DIAGNOSIS — I472 Ventricular tachycardia, unspecified: Secondary | ICD-10-CM | POA: Diagnosis not present

## 2023-03-28 DIAGNOSIS — B3731 Acute candidiasis of vulva and vagina: Secondary | ICD-10-CM | POA: Diagnosis not present

## 2023-03-28 DIAGNOSIS — I340 Nonrheumatic mitral (valve) insufficiency: Secondary | ICD-10-CM | POA: Diagnosis not present

## 2023-03-28 DIAGNOSIS — I5032 Chronic diastolic (congestive) heart failure: Secondary | ICD-10-CM | POA: Diagnosis not present

## 2023-03-28 DIAGNOSIS — K219 Gastro-esophageal reflux disease without esophagitis: Secondary | ICD-10-CM | POA: Diagnosis not present

## 2023-03-28 DIAGNOSIS — I252 Old myocardial infarction: Secondary | ICD-10-CM | POA: Diagnosis not present

## 2023-03-28 DIAGNOSIS — L304 Erythema intertrigo: Secondary | ICD-10-CM | POA: Diagnosis not present

## 2023-03-28 DIAGNOSIS — E1122 Type 2 diabetes mellitus with diabetic chronic kidney disease: Secondary | ICD-10-CM | POA: Diagnosis not present

## 2023-03-28 DIAGNOSIS — I97638 Postprocedural hematoma of a circulatory system organ or structure following other circulatory system procedure: Secondary | ICD-10-CM | POA: Diagnosis not present

## 2023-03-28 DIAGNOSIS — M6529 Calcific tendinitis, multiple sites: Secondary | ICD-10-CM | POA: Diagnosis not present

## 2023-03-28 DIAGNOSIS — N179 Acute kidney failure, unspecified: Secondary | ICD-10-CM

## 2023-03-28 DIAGNOSIS — G4730 Sleep apnea, unspecified: Secondary | ICD-10-CM | POA: Diagnosis not present

## 2023-03-28 DIAGNOSIS — H6123 Impacted cerumen, bilateral: Secondary | ICD-10-CM | POA: Diagnosis not present

## 2023-03-28 DIAGNOSIS — K449 Diaphragmatic hernia without obstruction or gangrene: Secondary | ICD-10-CM | POA: Diagnosis not present

## 2023-03-28 DIAGNOSIS — I5023 Acute on chronic systolic (congestive) heart failure: Secondary | ICD-10-CM | POA: Diagnosis not present

## 2023-03-28 DIAGNOSIS — I130 Hypertensive heart and chronic kidney disease with heart failure and stage 1 through stage 4 chronic kidney disease, or unspecified chronic kidney disease: Secondary | ICD-10-CM | POA: Diagnosis not present

## 2023-03-28 DIAGNOSIS — R911 Solitary pulmonary nodule: Secondary | ICD-10-CM | POA: Diagnosis not present

## 2023-03-28 DIAGNOSIS — I480 Paroxysmal atrial fibrillation: Secondary | ICD-10-CM | POA: Diagnosis not present

## 2023-03-28 LAB — COMPREHENSIVE METABOLIC PANEL
ALT: 28 U/L (ref 0–35)
AST: 23 U/L (ref 0–37)
Albumin: 4 g/dL (ref 3.5–5.2)
Alkaline Phosphatase: 131 U/L — ABNORMAL HIGH (ref 39–117)
BUN: 37 mg/dL — ABNORMAL HIGH (ref 6–23)
CO2: 27 mEq/L (ref 19–32)
Calcium: 9.7 mg/dL (ref 8.4–10.5)
Chloride: 99 mEq/L (ref 96–112)
Creatinine, Ser: 2.18 mg/dL — ABNORMAL HIGH (ref 0.40–1.20)
GFR: 20.58 mL/min — ABNORMAL LOW (ref >60.00)
Glucose, Bld: 132 mg/dL — ABNORMAL HIGH (ref 70–99)
Potassium: 4 mEq/L (ref 3.5–5.1)
Sodium: 139 mEq/L (ref 135–145)
Total Bilirubin: 0.6 mg/dL (ref 0.2–1.2)
Total Protein: 6.8 g/dL (ref 6.0–8.3)

## 2023-03-28 LAB — CBC WITH DIFFERENTIAL/PLATELET
Basophils Absolute: 0.1 10*3/uL (ref 0.0–0.1)
Basophils Relative: 1.1 % (ref 0.0–3.0)
Eosinophils Absolute: 0.1 10*3/uL (ref 0.0–0.7)
Eosinophils Relative: 2.1 % (ref 0.0–5.0)
HCT: 39.2 % (ref 36.0–46.0)
Hemoglobin: 12.6 g/dL (ref 12.0–15.0)
Lymphocytes Relative: 22 % (ref 12.0–46.0)
Lymphs Abs: 1 10*3/uL (ref 0.7–4.0)
MCHC: 32.2 g/dL (ref 30.0–36.0)
MCV: 94.6 fl (ref 78.0–100.0)
Monocytes Absolute: 0.5 10*3/uL (ref 0.1–1.0)
Monocytes Relative: 11.3 % (ref 3.0–12.0)
Neutro Abs: 3 10*3/uL (ref 1.4–7.7)
Neutrophils Relative %: 63.5 % (ref 43.0–77.0)
Platelets: 272 10*3/uL (ref 150.0–400.0)
RBC: 4.14 Mil/uL (ref 3.87–5.11)
RDW: 16.3 % — ABNORMAL HIGH (ref 11.5–15.5)
WBC: 4.7 10*3/uL (ref 4.0–10.5)

## 2023-03-28 LAB — LIPASE: Lipase: 14 U/L (ref 11.0–59.0)

## 2023-03-28 NOTE — Progress Notes (Unsigned)
Nausea and vomiting for 4 days.  She didn't take her AM meds, she did take gatorade and pepto bismol.  No fevers.  No diarrhea.  No burning with urination.  She felt better last night and slept well.  Off jardiance and metformin in the meantime.  Weight 144----->151 over the last 2 weeks.  Sugar has been up to 199, not above 200.  No abd pain but "rumbling." Normal BMs w/o black or bloody stools.    Not SOB usually but sometimes she feels like she needs to take an extra, deep breath.  She isn't lightheaded.  She doesn't feel bloated.   D/w pt about recent labs.    Bradycardia hx noted.  Pulse 69 today.  She has an ICD which is also backup pacemaker.    She hasn't tried zofran yet.    Meds, vitals, and allergies reviewed.   ROS: Per HPI unless specifically indicated in ROS section

## 2023-03-28 NOTE — Patient Instructions (Addendum)
Go to the lab on the way out.   If you have mychart we'll likely use that to update you.    Stay off jardiance and metformin for now.  Skip losartan today.  Take zofran for nausea and then try to take torsemide this AM for fluid.  We'll be in touch.  Take care.  Glad to see you.

## 2023-03-29 DIAGNOSIS — I2510 Atherosclerotic heart disease of native coronary artery without angina pectoris: Secondary | ICD-10-CM | POA: Diagnosis not present

## 2023-03-29 DIAGNOSIS — K449 Diaphragmatic hernia without obstruction or gangrene: Secondary | ICD-10-CM | POA: Diagnosis not present

## 2023-03-29 DIAGNOSIS — I480 Paroxysmal atrial fibrillation: Secondary | ICD-10-CM | POA: Diagnosis not present

## 2023-03-29 DIAGNOSIS — I130 Hypertensive heart and chronic kidney disease with heart failure and stage 1 through stage 4 chronic kidney disease, or unspecified chronic kidney disease: Secondary | ICD-10-CM | POA: Diagnosis not present

## 2023-03-29 DIAGNOSIS — I340 Nonrheumatic mitral (valve) insufficiency: Secondary | ICD-10-CM | POA: Diagnosis not present

## 2023-03-29 DIAGNOSIS — I472 Ventricular tachycardia, unspecified: Secondary | ICD-10-CM | POA: Diagnosis not present

## 2023-03-29 DIAGNOSIS — N179 Acute kidney failure, unspecified: Secondary | ICD-10-CM | POA: Diagnosis not present

## 2023-03-29 DIAGNOSIS — I5023 Acute on chronic systolic (congestive) heart failure: Secondary | ICD-10-CM | POA: Diagnosis not present

## 2023-03-29 DIAGNOSIS — M6529 Calcific tendinitis, multiple sites: Secondary | ICD-10-CM | POA: Diagnosis not present

## 2023-03-29 DIAGNOSIS — G4730 Sleep apnea, unspecified: Secondary | ICD-10-CM | POA: Diagnosis not present

## 2023-03-29 DIAGNOSIS — E1122 Type 2 diabetes mellitus with diabetic chronic kidney disease: Secondary | ICD-10-CM | POA: Diagnosis not present

## 2023-03-29 DIAGNOSIS — Q809 Congenital ichthyosis, unspecified: Secondary | ICD-10-CM | POA: Diagnosis not present

## 2023-03-29 DIAGNOSIS — I5032 Chronic diastolic (congestive) heart failure: Secondary | ICD-10-CM | POA: Diagnosis not present

## 2023-03-29 DIAGNOSIS — R911 Solitary pulmonary nodule: Secondary | ICD-10-CM | POA: Diagnosis not present

## 2023-03-29 DIAGNOSIS — B3731 Acute candidiasis of vulva and vagina: Secondary | ICD-10-CM | POA: Diagnosis not present

## 2023-03-29 DIAGNOSIS — D631 Anemia in chronic kidney disease: Secondary | ICD-10-CM | POA: Diagnosis not present

## 2023-03-29 DIAGNOSIS — I252 Old myocardial infarction: Secondary | ICD-10-CM | POA: Diagnosis not present

## 2023-03-29 DIAGNOSIS — E785 Hyperlipidemia, unspecified: Secondary | ICD-10-CM | POA: Diagnosis not present

## 2023-03-29 DIAGNOSIS — K219 Gastro-esophageal reflux disease without esophagitis: Secondary | ICD-10-CM | POA: Diagnosis not present

## 2023-03-29 DIAGNOSIS — L304 Erythema intertrigo: Secondary | ICD-10-CM | POA: Diagnosis not present

## 2023-03-29 DIAGNOSIS — I429 Cardiomyopathy, unspecified: Secondary | ICD-10-CM | POA: Diagnosis not present

## 2023-03-29 DIAGNOSIS — G4700 Insomnia, unspecified: Secondary | ICD-10-CM | POA: Diagnosis not present

## 2023-03-29 DIAGNOSIS — I97638 Postprocedural hematoma of a circulatory system organ or structure following other circulatory system procedure: Secondary | ICD-10-CM | POA: Diagnosis not present

## 2023-03-29 DIAGNOSIS — H6123 Impacted cerumen, bilateral: Secondary | ICD-10-CM | POA: Diagnosis not present

## 2023-03-29 DIAGNOSIS — N1832 Chronic kidney disease, stage 3b: Secondary | ICD-10-CM | POA: Diagnosis not present

## 2023-03-29 NOTE — Assessment & Plan Note (Addendum)
She could have a benign viral gastroenteritis complicating her other issues.  I would stay off jardiance and metformin for now.  Recheck labs pending. Skip losartan today.  I asked her to try to take zofran for nausea when she gets home and then try to take torsemide this AM for fluid.  Rationale discussed with patient and husband. 30 minutes were devoted to patient care in this encounter (this includes time spent reviewing the patient's file/history, interviewing and examining the patient, counseling/reviewing plan with patient).  At this point still okay for outpatient follow-up.

## 2023-03-30 ENCOUNTER — Ambulatory Visit: Payer: Medicare Other

## 2023-03-30 ENCOUNTER — Inpatient Hospital Stay (HOSPITAL_COMMUNITY)
Admission: EM | Admit: 2023-03-30 | Discharge: 2023-04-03 | DRG: 683 | Disposition: A | Discharge status: Home / Self Care | Payer: Medicare Other | Attending: Internal Medicine | Admitting: Internal Medicine

## 2023-03-30 ENCOUNTER — Telehealth: Payer: Self-pay | Admitting: Family Medicine

## 2023-03-30 ENCOUNTER — Emergency Department (HOSPITAL_COMMUNITY): Payer: Medicare Other

## 2023-03-30 ENCOUNTER — Encounter (HOSPITAL_COMMUNITY): Payer: Self-pay | Admitting: Emergency Medicine

## 2023-03-30 ENCOUNTER — Other Ambulatory Visit: Payer: Self-pay

## 2023-03-30 DIAGNOSIS — H9190 Unspecified hearing loss, unspecified ear: Secondary | ICD-10-CM | POA: Diagnosis not present

## 2023-03-30 DIAGNOSIS — E1122 Type 2 diabetes mellitus with diabetic chronic kidney disease: Secondary | ICD-10-CM | POA: Diagnosis not present

## 2023-03-30 DIAGNOSIS — I480 Paroxysmal atrial fibrillation: Secondary | ICD-10-CM | POA: Diagnosis not present

## 2023-03-30 DIAGNOSIS — Z8249 Family history of ischemic heart disease and other diseases of the circulatory system: Secondary | ICD-10-CM

## 2023-03-30 DIAGNOSIS — Z79899 Other long term (current) drug therapy: Secondary | ICD-10-CM

## 2023-03-30 DIAGNOSIS — A084 Viral intestinal infection, unspecified: Secondary | ICD-10-CM | POA: Diagnosis present

## 2023-03-30 DIAGNOSIS — K829 Disease of gallbladder, unspecified: Secondary | ICD-10-CM | POA: Diagnosis not present

## 2023-03-30 DIAGNOSIS — Z87891 Personal history of nicotine dependence: Secondary | ICD-10-CM | POA: Diagnosis not present

## 2023-03-30 DIAGNOSIS — E1151 Type 2 diabetes mellitus with diabetic peripheral angiopathy without gangrene: Secondary | ICD-10-CM | POA: Diagnosis not present

## 2023-03-30 DIAGNOSIS — I5023 Acute on chronic systolic (congestive) heart failure: Secondary | ICD-10-CM | POA: Diagnosis not present

## 2023-03-30 DIAGNOSIS — R112 Nausea with vomiting, unspecified: Secondary | ICD-10-CM | POA: Diagnosis present

## 2023-03-30 DIAGNOSIS — E1169 Type 2 diabetes mellitus with other specified complication: Secondary | ICD-10-CM | POA: Diagnosis not present

## 2023-03-30 DIAGNOSIS — E785 Hyperlipidemia, unspecified: Secondary | ICD-10-CM | POA: Diagnosis present

## 2023-03-30 DIAGNOSIS — Z7902 Long term (current) use of antithrombotics/antiplatelets: Secondary | ICD-10-CM | POA: Diagnosis not present

## 2023-03-30 DIAGNOSIS — A041 Enterotoxigenic Escherichia coli infection: Secondary | ICD-10-CM | POA: Diagnosis present

## 2023-03-30 DIAGNOSIS — Z955 Presence of coronary angioplasty implant and graft: Secondary | ICD-10-CM | POA: Diagnosis not present

## 2023-03-30 DIAGNOSIS — E872 Acidosis, unspecified: Secondary | ICD-10-CM | POA: Diagnosis not present

## 2023-03-30 DIAGNOSIS — Z7901 Long term (current) use of anticoagulants: Secondary | ICD-10-CM | POA: Diagnosis not present

## 2023-03-30 DIAGNOSIS — Z90710 Acquired absence of both cervix and uterus: Secondary | ICD-10-CM

## 2023-03-30 DIAGNOSIS — N179 Acute kidney failure, unspecified: Secondary | ICD-10-CM | POA: Diagnosis not present

## 2023-03-30 DIAGNOSIS — G4733 Obstructive sleep apnea (adult) (pediatric): Secondary | ICD-10-CM | POA: Diagnosis not present

## 2023-03-30 DIAGNOSIS — J9601 Acute respiratory failure with hypoxia: Secondary | ICD-10-CM | POA: Diagnosis not present

## 2023-03-30 DIAGNOSIS — Z95810 Presence of automatic (implantable) cardiac defibrillator: Secondary | ICD-10-CM

## 2023-03-30 DIAGNOSIS — I420 Dilated cardiomyopathy: Secondary | ICD-10-CM | POA: Diagnosis not present

## 2023-03-30 DIAGNOSIS — I130 Hypertensive heart and chronic kidney disease with heart failure and stage 1 through stage 4 chronic kidney disease, or unspecified chronic kidney disease: Secondary | ICD-10-CM | POA: Diagnosis present

## 2023-03-30 DIAGNOSIS — K8020 Calculus of gallbladder without cholecystitis without obstruction: Secondary | ICD-10-CM | POA: Diagnosis not present

## 2023-03-30 DIAGNOSIS — N183 Chronic kidney disease, stage 3 unspecified: Secondary | ICD-10-CM | POA: Diagnosis not present

## 2023-03-30 DIAGNOSIS — I252 Old myocardial infarction: Secondary | ICD-10-CM | POA: Diagnosis not present

## 2023-03-30 DIAGNOSIS — M7989 Other specified soft tissue disorders: Secondary | ICD-10-CM | POA: Diagnosis not present

## 2023-03-30 DIAGNOSIS — E875 Hyperkalemia: Secondary | ICD-10-CM | POA: Diagnosis not present

## 2023-03-30 DIAGNOSIS — I472 Ventricular tachycardia, unspecified: Secondary | ICD-10-CM | POA: Diagnosis not present

## 2023-03-30 DIAGNOSIS — I2510 Atherosclerotic heart disease of native coronary artery without angina pectoris: Secondary | ICD-10-CM | POA: Diagnosis present

## 2023-03-30 DIAGNOSIS — E860 Dehydration: Secondary | ICD-10-CM | POA: Diagnosis not present

## 2023-03-30 DIAGNOSIS — Z888 Allergy status to other drugs, medicaments and biological substances status: Secondary | ICD-10-CM

## 2023-03-30 DIAGNOSIS — I10 Essential (primary) hypertension: Secondary | ICD-10-CM | POA: Diagnosis not present

## 2023-03-30 DIAGNOSIS — I509 Heart failure, unspecified: Secondary | ICD-10-CM | POA: Diagnosis not present

## 2023-03-30 DIAGNOSIS — K828 Other specified diseases of gallbladder: Secondary | ICD-10-CM | POA: Diagnosis not present

## 2023-03-30 DIAGNOSIS — K5730 Diverticulosis of large intestine without perforation or abscess without bleeding: Secondary | ICD-10-CM | POA: Diagnosis not present

## 2023-03-30 DIAGNOSIS — Z882 Allergy status to sulfonamides status: Secondary | ICD-10-CM

## 2023-03-30 LAB — CBC WITH DIFFERENTIAL/PLATELET
Abs Immature Granulocytes: 0.01 10*3/uL (ref 0.00–0.07)
Basophils Absolute: 0 10*3/uL (ref 0.0–0.1)
Basophils Relative: 1 %
Eosinophils Absolute: 0.1 10*3/uL (ref 0.0–0.5)
Eosinophils Relative: 3 %
HCT: 37.3 % (ref 36.0–46.0)
Hemoglobin: 11.6 g/dL — ABNORMAL LOW (ref 12.0–15.0)
Immature Granulocytes: 0 %
Lymphocytes Relative: 31 %
Lymphs Abs: 1.4 10*3/uL (ref 0.7–4.0)
MCH: 29.9 pg (ref 26.0–34.0)
MCHC: 31.1 g/dL (ref 30.0–36.0)
MCV: 96.1 fL (ref 80.0–100.0)
Monocytes Absolute: 0.5 10*3/uL (ref 0.1–1.0)
Monocytes Relative: 10 %
Neutro Abs: 2.6 10*3/uL (ref 1.7–7.7)
Neutrophils Relative %: 55 %
Platelets: 242 10*3/uL (ref 150–400)
RBC: 3.88 MIL/uL (ref 3.87–5.11)
RDW: 15.1 % (ref 11.5–15.5)
WBC: 4.6 10*3/uL (ref 4.0–10.5)
nRBC: 0.4 % — ABNORMAL HIGH (ref 0.0–0.2)

## 2023-03-30 LAB — COMPREHENSIVE METABOLIC PANEL
ALT: 64 U/L — ABNORMAL HIGH (ref 0–44)
AST: 54 U/L — ABNORMAL HIGH (ref 15–41)
Albumin: 3.3 g/dL — ABNORMAL LOW (ref 3.5–5.0)
Alkaline Phosphatase: 212 U/L — ABNORMAL HIGH (ref 38–126)
Anion gap: 15 (ref 5–15)
BUN: 42 mg/dL — ABNORMAL HIGH (ref 8–23)
CO2: 21 mmol/L — ABNORMAL LOW (ref 22–32)
Calcium: 9.1 mg/dL (ref 8.9–10.3)
Chloride: 97 mmol/L — ABNORMAL LOW (ref 98–111)
Creatinine, Ser: 3.06 mg/dL — ABNORMAL HIGH (ref 0.44–1.00)
GFR, Estimated: 15 mL/min — ABNORMAL LOW (ref >60)
Glucose, Bld: 195 mg/dL — ABNORMAL HIGH (ref 70–99)
Potassium: 3.9 mmol/L (ref 3.5–5.1)
Sodium: 133 mmol/L — ABNORMAL LOW (ref 135–145)
Total Bilirubin: 0.5 mg/dL (ref 0.3–1.2)
Total Protein: 6.3 g/dL — ABNORMAL LOW (ref 6.5–8.1)

## 2023-03-30 LAB — LIPASE, BLOOD: Lipase: 33 U/L (ref 11–51)

## 2023-03-30 NOTE — ED Provider Triage Note (Signed)
Emergency Medicine Provider Triage Evaluation Note  Natasha Woodard , a 82 y.o. female  was evaluated in triage.  Pt complains of abdominal pain.  Started 5 days ago.  Located all over the mid abdomen.  It feels like "bubbly and queasy".  Endorses nausea and vomiting but no diarrhea.  She last vomited 3 days ago.  No bloody output.  Normal bowel movement this morning.  Denies fever and chills.  Denies urinary changes.  Review of Systems  Positive: See above Negative: See above  Physical Exam  BP 114/75 (BP Location: Right Arm)   Pulse 71   Temp 98.6 F (37 C)   Resp 16   SpO2 97%  Gen:   Awake, no distress   Resp:  Normal effort  MSK:   Moves extremities without difficulty  Other:    Medical Decision Making  Medically screening exam initiated at 6:34 PM.  Appropriate orders placed.  Natasha Woodard was informed that the remainder of the evaluation will be completed by another provider, this initial triage assessment does not replace that evaluation, and the importance of remaining in the ED until their evaluation is complete.  Work up started   Gareth Eagle, [place] 03/30/23 [phone]

## 2023-03-30 NOTE — Telephone Encounter (Signed)
Spoke with patient and advised to go to ER. Patient agreed to go.

## 2023-03-30 NOTE — ED Triage Notes (Signed)
Pt c/o generalized abdominal discomfort for several days. Endorses nausea and decreased appetite.

## 2023-03-30 NOTE — Telephone Encounter (Signed)
Patient was seen on 03/28/2023 for stomach pain,vomiting.She called vin today  stating that her stomach still hurts really bad ,and it seem like everything she eats goes right to her stomach and causes it to hurt.She would like to know if she needs to get reevaluated or if theres a different medication she can possibly try?She said that she's not having any vomiting ,just stomach pains.

## 2023-03-30 NOTE — Telephone Encounter (Signed)
If she is down 6lbs with those symptoms, then I recommend ER now.  Thanks.

## 2023-03-30 NOTE — Telephone Encounter (Signed)
Called and spoke with patient about her results and the medications she needs to stop. Patient still having stomach pain and nausea. Weight today is 145 lbs. Only times she feels relief is when she is laying down. Eating and taking her meds cause her severe stomach pain.

## 2023-03-31 ENCOUNTER — Encounter (HOSPITAL_COMMUNITY): Payer: Medicare Other

## 2023-03-31 ENCOUNTER — Emergency Department (HOSPITAL_COMMUNITY): Payer: Medicare Other

## 2023-03-31 ENCOUNTER — Inpatient Hospital Stay (HOSPITAL_COMMUNITY): Payer: Medicare Other

## 2023-03-31 ENCOUNTER — Encounter (HOSPITAL_BASED_OUTPATIENT_CLINIC_OR_DEPARTMENT_OTHER): Payer: Medicare Other | Admitting: Pulmonary Disease

## 2023-03-31 DIAGNOSIS — M7989 Other specified soft tissue disorders: Secondary | ICD-10-CM | POA: Diagnosis not present

## 2023-03-31 DIAGNOSIS — E1122 Type 2 diabetes mellitus with diabetic chronic kidney disease: Secondary | ICD-10-CM | POA: Diagnosis present

## 2023-03-31 DIAGNOSIS — I10 Essential (primary) hypertension: Secondary | ICD-10-CM | POA: Diagnosis not present

## 2023-03-31 DIAGNOSIS — H9190 Unspecified hearing loss, unspecified ear: Secondary | ICD-10-CM | POA: Diagnosis present

## 2023-03-31 DIAGNOSIS — A041 Enterotoxigenic Escherichia coli infection: Secondary | ICD-10-CM | POA: Diagnosis present

## 2023-03-31 DIAGNOSIS — A084 Viral intestinal infection, unspecified: Secondary | ICD-10-CM | POA: Diagnosis present

## 2023-03-31 DIAGNOSIS — E1169 Type 2 diabetes mellitus with other specified complication: Secondary | ICD-10-CM | POA: Diagnosis not present

## 2023-03-31 DIAGNOSIS — Z90710 Acquired absence of both cervix and uterus: Secondary | ICD-10-CM | POA: Diagnosis not present

## 2023-03-31 DIAGNOSIS — Z95810 Presence of automatic (implantable) cardiac defibrillator: Secondary | ICD-10-CM | POA: Diagnosis not present

## 2023-03-31 DIAGNOSIS — E860 Dehydration: Secondary | ICD-10-CM | POA: Diagnosis present

## 2023-03-31 DIAGNOSIS — I472 Ventricular tachycardia, unspecified: Secondary | ICD-10-CM | POA: Diagnosis present

## 2023-03-31 DIAGNOSIS — I420 Dilated cardiomyopathy: Secondary | ICD-10-CM | POA: Diagnosis present

## 2023-03-31 DIAGNOSIS — Z87891 Personal history of nicotine dependence: Secondary | ICD-10-CM | POA: Diagnosis not present

## 2023-03-31 DIAGNOSIS — Z955 Presence of coronary angioplasty implant and graft: Secondary | ICD-10-CM | POA: Diagnosis not present

## 2023-03-31 DIAGNOSIS — N179 Acute kidney failure, unspecified: Secondary | ICD-10-CM | POA: Diagnosis present

## 2023-03-31 DIAGNOSIS — E1151 Type 2 diabetes mellitus with diabetic peripheral angiopathy without gangrene: Secondary | ICD-10-CM | POA: Diagnosis present

## 2023-03-31 DIAGNOSIS — R112 Nausea with vomiting, unspecified: Secondary | ICD-10-CM | POA: Diagnosis not present

## 2023-03-31 DIAGNOSIS — I480 Paroxysmal atrial fibrillation: Secondary | ICD-10-CM | POA: Diagnosis present

## 2023-03-31 DIAGNOSIS — Z8249 Family history of ischemic heart disease and other diseases of the circulatory system: Secondary | ICD-10-CM | POA: Diagnosis not present

## 2023-03-31 DIAGNOSIS — Z7902 Long term (current) use of antithrombotics/antiplatelets: Secondary | ICD-10-CM | POA: Diagnosis not present

## 2023-03-31 DIAGNOSIS — K829 Disease of gallbladder, unspecified: Secondary | ICD-10-CM | POA: Diagnosis present

## 2023-03-31 DIAGNOSIS — I130 Hypertensive heart and chronic kidney disease with heart failure and stage 1 through stage 4 chronic kidney disease, or unspecified chronic kidney disease: Secondary | ICD-10-CM | POA: Diagnosis present

## 2023-03-31 DIAGNOSIS — N183 Chronic kidney disease, stage 3 unspecified: Secondary | ICD-10-CM | POA: Diagnosis present

## 2023-03-31 DIAGNOSIS — G4733 Obstructive sleep apnea (adult) (pediatric): Secondary | ICD-10-CM | POA: Diagnosis present

## 2023-03-31 DIAGNOSIS — E785 Hyperlipidemia, unspecified: Secondary | ICD-10-CM | POA: Diagnosis present

## 2023-03-31 DIAGNOSIS — I252 Old myocardial infarction: Secondary | ICD-10-CM | POA: Diagnosis not present

## 2023-03-31 DIAGNOSIS — Z7901 Long term (current) use of anticoagulants: Secondary | ICD-10-CM | POA: Diagnosis not present

## 2023-03-31 DIAGNOSIS — I2510 Atherosclerotic heart disease of native coronary artery without angina pectoris: Secondary | ICD-10-CM | POA: Diagnosis present

## 2023-03-31 DIAGNOSIS — K828 Other specified diseases of gallbladder: Secondary | ICD-10-CM | POA: Diagnosis not present

## 2023-03-31 LAB — HEMOGLOBIN A1C
Hgb A1c MFr Bld: 5.6 % (ref 4.8–5.6)
Mean Plasma Glucose: 114.02 mg/dL

## 2023-03-31 LAB — URINALYSIS, ROUTINE W REFLEX MICROSCOPIC
Bilirubin Urine: NEGATIVE
Glucose, UA: NEGATIVE mg/dL
Ketones, ur: NEGATIVE mg/dL
Nitrite: NEGATIVE
Protein, ur: 100 mg/dL — AB
Specific Gravity, Urine: 1.01 (ref 1.005–1.030)
pH: 5 (ref 5.0–8.0)

## 2023-03-31 LAB — BASIC METABOLIC PANEL
Anion gap: 14 (ref 5–15)
BUN: 43 mg/dL — ABNORMAL HIGH (ref 8–23)
CO2: 21 mmol/L — ABNORMAL LOW (ref 22–32)
Calcium: 9.2 mg/dL (ref 8.9–10.3)
Chloride: 100 mmol/L (ref 98–111)
Creatinine, Ser: 2.96 mg/dL — ABNORMAL HIGH (ref 0.44–1.00)
GFR, Estimated: 15 mL/min — ABNORMAL LOW (ref >60)
Glucose, Bld: 114 mg/dL — ABNORMAL HIGH (ref 70–99)
Potassium: 4.1 mmol/L (ref 3.5–5.1)
Sodium: 135 mmol/L (ref 135–145)

## 2023-03-31 LAB — C-REACTIVE PROTEIN: CRP: 0.7 mg/dL (ref <1.0)

## 2023-03-31 LAB — TROPONIN I (HIGH SENSITIVITY)
Troponin I (High Sensitivity): 45 ng/L — ABNORMAL HIGH (ref <18)
Troponin I (High Sensitivity): 40 ng/L — ABNORMAL HIGH (ref <18)

## 2023-03-31 LAB — CBG MONITORING, ED: Glucose-Capillary: 97 mg/dL (ref 70–99)

## 2023-03-31 LAB — BRAIN NATRIURETIC PEPTIDE: B Natriuretic Peptide: 1775 pg/mL — ABNORMAL HIGH (ref 0.0–100.0)

## 2023-03-31 LAB — PROCALCITONIN: Procalcitonin: 0.37 ng/mL

## 2023-03-31 LAB — GLUCOSE, CAPILLARY
Glucose-Capillary: 171 mg/dL — ABNORMAL HIGH (ref 70–99)
Glucose-Capillary: 119 mg/dL — ABNORMAL HIGH (ref 70–99)

## 2023-03-31 MED ORDER — ACETAMINOPHEN 650 MG RE SUPP: 650.0000 mg | Freq: Four times a day (QID) | RECTAL | Status: DC | PRN

## 2023-03-31 MED ORDER — ACETAMINOPHEN 325 MG PO TABS
650.0000 mg | ORAL_TABLET | Freq: Four times a day (QID) | ORAL | Status: DC | PRN
  Administered 2023-03-31 – 2023-04-01 (×2): 650 mg via ORAL
  Filled 2023-03-31 (×2): qty 2

## 2023-03-31 MED ORDER — ENOXAPARIN SODIUM 30 MG/0.3ML IJ SOSY: 30.0000 mg | PREFILLED_SYRINGE | INTRAMUSCULAR | Status: DC

## 2023-03-31 MED ORDER — ONDANSETRON HCL 4 MG/2ML IJ SOLN: 4.0000 mg | Freq: Four times a day (QID) | INTRAMUSCULAR | Status: DC | PRN

## 2023-03-31 MED ORDER — CLOPIDOGREL BISULFATE 75 MG PO TABS
75.0000 mg | ORAL_TABLET | Freq: Every day | ORAL | Status: DC
  Administered 2023-03-31 – 2023-04-02 (×3): 75 mg via ORAL
  Filled 2023-03-31 (×3): qty 1

## 2023-03-31 MED ORDER — SENNOSIDES-DOCUSATE SODIUM 8.6-50 MG PO TABS
1.0000 | ORAL_TABLET | Freq: Every day | ORAL | Status: DC
  Administered 2023-04-02: 1 via ORAL
  Filled 2023-03-31 (×3): qty 1

## 2023-03-31 MED ORDER — HEPARIN (PORCINE) 25000 UT/250ML-% IV SOLN: 900.0000 [IU]/h | INTRAVENOUS | Status: DC

## 2023-03-31 MED ORDER — SODIUM CHLORIDE 0.9 % IV BOLUS (SEPSIS)
500.0000 mL | Freq: Once | INTRAVENOUS | Status: AC
  Administered 2023-03-31: 500 mL via INTRAVENOUS

## 2023-03-31 MED ORDER — SODIUM CHLORIDE 0.9% FLUSH
3.0000 mL | Freq: Two times a day (BID) | INTRAVENOUS | Status: DC
  Administered 2023-03-31 – 2023-04-03 (×6): 3 mL via INTRAVENOUS

## 2023-03-31 MED ORDER — INSULIN ASPART 100 UNIT/ML IJ SOLN
0.0000 [IU] | Freq: Three times a day (TID) | INTRAMUSCULAR | Status: DC
  Administered 2023-03-31: 2 [IU] via SUBCUTANEOUS
  Administered 2023-04-01: 1 [IU] via SUBCUTANEOUS
  Administered 2023-04-01: 2 [IU] via SUBCUTANEOUS
  Administered 2023-04-02 (×3): 1 [IU] via SUBCUTANEOUS

## 2023-03-31 MED ORDER — ONDANSETRON HCL 4 MG PO TABS: 4.0000 mg | ORAL_TABLET | Freq: Four times a day (QID) | ORAL | Status: DC | PRN

## 2023-03-31 MED ORDER — POLYETHYLENE GLYCOL 3350 17 G PO PACK: 17.0000 g | PACK | Freq: Every day | ORAL | Status: DC | PRN

## 2023-03-31 MED ORDER — SODIUM CHLORIDE 0.9 % IV SOLN: INTRAVENOUS | Status: DC

## 2023-03-31 MED ORDER — TRAMADOL HCL 50 MG PO TABS
50.0000 mg | ORAL_TABLET | Freq: Two times a day (BID) | ORAL | Status: DC | PRN
  Administered 2023-03-31 – 2023-04-02 (×5): 50 mg via ORAL
  Filled 2023-03-31 (×5): qty 1

## 2023-03-31 MED ORDER — APIXABAN 2.5 MG PO TABS
2.5000 mg | ORAL_TABLET | Freq: Two times a day (BID) | ORAL | Status: DC
  Administered 2023-03-31 – 2023-04-03 (×7): 2.5 mg via ORAL
  Filled 2023-03-31 (×7): qty 1

## 2023-03-31 MED ORDER — MELATONIN 5 MG PO TABS
5.0000 mg | ORAL_TABLET | Freq: Every evening | ORAL | Status: DC | PRN
  Administered 2023-03-31 – 2023-04-01 (×2): 5 mg via ORAL
  Filled 2023-03-31 (×2): qty 1

## 2023-03-31 MED ORDER — INSULIN ASPART 100 UNIT/ML IJ SOLN: 0.0000 [IU] | Freq: Every day | INTRAMUSCULAR | Status: DC

## 2023-03-31 NOTE — ED Notes (Signed)
ED TO INPATIENT HANDOFF REPORT  ED Nurse Name and Phone #: [phone]  S Name/Age/Gender Natasha Woodard 82 y.o. female Room/Bed: 039C/039C  Code Status   Code Status: Full Code  Home/SNF/Other Home Patient oriented to: self, place, time, and situation Is this baseline? Yes   Triage Complete: Triage complete  Chief Complaint Acute kidney injury [N17.9]  Triage Note Pt c/o generalized abdominal discomfort for several days. Endorses nausea and decreased appetite.    Allergies Allergies  Allergen Reactions   Ezetimibe-Simvastatin Other (See Comments)    REACTION: Muscle aches (side effect)   Lipitor [Atorvastatin] Other (See Comments)    Leg weakness    Claritin [Loratadine]     Possible cause of nightmares.     Spironolactone     Caution re: elevated potassium/creatinine   Tamiflu [Oseltamivir Phosphate] Other (See Comments)    nightmares   Pravastatin Sodium Other (See Comments)    REACTION: Muscle aches (side effect)   Sulfonamide Derivatives Nausea And Vomiting    Level of Care/Admitting Diagnosis ED Disposition     ED Disposition  Admit   Condition  --   Comment  Hospital Area: [facility] [[id number]]  Level of Care: Telemetry Medical [[id number]]  May admit patient to [facility] or [facility] if equivalent level of care is available:: Yes  Covid Evaluation: Confirmed COVID Negative  Diagnosis: Acute kidney injury [[id number]]  Admitting Physician: Lanae Boast [[id number]]  Attending Physician: Russella Dar [[id number]]  Certification:: I certify this patient will need inpatient services for at least 2 midnights          B Medical/Surgery History Past Medical History:  Diagnosis Date   AICD (automatic cardioverter/defibrillator) present    Calcific tendonitis 2008   Treatment of left leg   Cardiomyopathy 06/2017   a) Echo: EF 25-30%. GR 1-2 DD w/ elevated LVEDP. Mild valvular Dz; b) Cardiac MRI 2/'19: frequent PVCs (diffiuclt to interpret) - EF  ~27% w/ diffuse HK.  No evidence of infarct, infiltrative Dz or myocarditis. -- ? if related to PVCs. c) f/u Echo 6/'19: EF 35-40%. Gr 1 DD. Diffuse HK.-> d) Jan 2020 EF 50 to 55%.  Mod MAC, mod LAE.  Ao Sclerosis; e) Echo 1/'21 - EF 60-65%, Gr II DD. Mild Mod MR.    Chronic combined systolic and diastolic CHF, NYHA class 2 and ACA/AHA stage C    Now essentially resolved, back to simply diastolic CHF   Coronary artery disease, non-occlusive    mild-moderate CAD 06/30/17 cath   CTS (carpal tunnel syndrome)    Diabetes mellitus    Type II   Dysrhythmia    patient said that she cant remember what it is   Frequent unifocal PVCs 01/2018   Event Monitor: NSR, W/ frequent multifocal PVCs (9%-down from 30% prior to amiodarone) and PACs.  Nighttime bradycardia suggestive of OSA.';  Zio patch September 2023: PVC burden now 6.1%.   Hiatal hernia    with reflux   Hyperlipidemia    Statin intolerant   Hypertension    Ichthyosis congenita    Intermittent claudication of both lower extremities due to atherosclerosis 08/22/2021   LEA Dopplers 09/09/2021:  Right: Total occlusion noted in the superficial femoral artery. Atherosclerosis noted throughout extremity, see note above. Three vessel runoff.  Left: Total occlusion noted in the superficial femoral artery and/or popliteal artery. Total occlusion noted in the distal anterior tibial artery. Athereosclerosis noted throughout extremity.    NSVD (normal spontaneous vaginal delivery) 1971 &  1972   Obesity    Plantar fasciitis    Left   Presence of permanent cardiac pacemaker    Pulmonary nodule    Imaged multiple times and benign appearing   Sleep apnea    Wears glasses    Past Surgical History:  Procedure Laterality Date   ABDOMINAL HYSTERECTOMY  1975   TAH-BSO   ABSCESS DRAINAGE  1972   right breast   APPLICATION OF WOUND VAC Right 01/15/2023   Procedure: APPLICATION OF INCISIONAL WOUND VAC;  Surgeon: Victorino Sparrow, MD;  Location: [facility] OR;   Service: Vascular;  Laterality: Right;   APPLICATION OF WOUND VAC Right 01/26/2023   Procedure: APPLICATION OF WOUND VAC RIGHT GROIN WASHOUT AND SARTORIUS MUSCLE FLAP;  Surgeon: Victorino Sparrow, MD;  Location: [facility] OR;  Service: Vascular;  Laterality: Right;   ARTERY REPAIR Right 01/15/2023   Procedure: RIGHT PROFUNDA ARTERY REPAIR WITH 2000G MYRIAD MORCELLS;  Surgeon: Victorino Sparrow, MD;  Location: [facility] OR;  Service: Vascular;  Laterality: Right;   BREAST BIOPSY Left 01/14/2009   Stereo- Benign   CARDIAC MRI  01/2018   Difficult to interpret 2/2 PVCs. Normal LV size - EF ~27% with diffuse HK. Mild RV dilation - normal fxn. -- NO MYOCARDIAL LGI => no definitive evidence of prior MI, Infiltrative Dz or Myocarditis -- suspect NICM, possibly related to PVCs.   CARPAL TUNNEL RELEASE Right 07/02/2014   Procedure: RIGHT CARPAL TUNNEL RELEASE;  Surgeon: Nicki Reaper, MD;  Location: [facility];  Service: Orthopedics;  Laterality: Right;   CARPAL TUNNEL RELEASE Left 06/11/2015   Procedure: LEFT CARPAL TUNNEL RELEASE;  Surgeon: Cindee Salt, MD;  Location: [facility];  Service: Orthopedics;  Laterality: Left;  REGIONAL/FAB   COLONOSCOPY     corn removal  1968   right   CORONARY STENT INTERVENTION N/A 01/10/2023   Procedure: CORONARY STENT INTERVENTION;  Surgeon: Lennette Bihari, MD;  [facility]: [facility] INVASIVE CV LAB;  Service: Cardiovascular;  Laterality: N/A;   GROIN DEBRIDEMENT Right 02/06/2023   Procedure: RIGHT GROIN DEBRIDEMENT WITH WOUND VAC CHANGE;  Surgeon: Victorino Sparrow, MD;  Location: [facility] OR;  Service: Vascular;  Laterality: Right;   HEMATOMA EVACUATION Right 01/15/2023   Procedure: EVACUATION HEMATOMA RIGHT GROIN;  Surgeon: Victorino Sparrow, MD;  Location: [facility] OR;  Service: Vascular;  Laterality: Right;   Holter Monitor  06/2017   ~17,000 PVC beats - majority were singlets, some couplets. 44 brief 3-7 beat runs of NSVT. Also noted were less frequent PACs with 11 runs (longest  15 beats)   IABP INSERTION Right 01/10/2023   Procedure: IABP Insertion;  Surgeon: Lennette Bihari, MD;  Location: [facility] INVASIVE CV LAB;  Service: Cardiovascular;  Laterality: Right;   ICD IMPLANT N/A 01/23/2023   Procedure: ICD IMPLANT;  Surgeon: Marinus Maw, MD;  Location: [facility] INVASIVE CV LAB;  Service: Cardiovascular;  Laterality: N/A;   INCISION AND DRAINAGE OF WOUND Right 01/31/2023   Procedure: IRRIGATION AND DEBRIDEMENT RIGHT GROIN WOUND AND WOUND VAC CHANGE;  Surgeon: Victorino Sparrow, MD;  Location: [facility] OR;  Service: Vascular;  Laterality: Right;   OPEN REDUCTION INTERNAL FIXATION (ORIF) DISTAL RADIAL FRACTURE Left 07/21/2017   Procedure: OPEN REDUCTION INTERNAL FIXATION (ORIF) LEFT DISTAL RADIAL FRACTURE;  Surgeon: Sheral Apley, MD;  Location: [facility] OR;  Service: Orthopedics;  Laterality: Left;   RIGHT/LEFT HEART CATH AND CORONARY ANGIOGRAPHY N/A 06/30/2017   Procedure: Right/Left Heart Cath and Coronary Angiography;  Surgeon: Marykay Lex, MD;  Location: [facility] INVASIVE CV LAB:  pRCA 55%, pCx 40%, OM1 45%, mCx 50%, D2 50% - LVEF 25-35%. Moderately elevated LVEDP(26 mmHg with PCWP 16 mmHg).  FICK CO/CI: 4.47/2.48. PA pressures 47/14 mmHg with a mean of 27 mm.   RIGHT/LEFT HEART CATH AND CORONARY ANGIOGRAPHY N/A 01/10/2023   Procedure: RIGHT/LEFT HEART CATH AND CORONARY ANGIOGRAPHY;  Surgeon: Lennette Bihari, MD;  Location: [facility] INVASIVE CV LAB;  Service: Cardiovascular;  Laterality: N/A;   ROTATOR CUFF REPAIR     left shoulder   TEMPORARY PACEMAKER N/A 01/19/2023   Procedure: TEMPORARY PACEMAKER;  Surgeon: Dolores Patty, MD;  Location: [facility] INVASIVE CV LAB;  Service: Cardiovascular;  Laterality: N/A;   TRANSTHORACIC ECHOCARDIOGRAM  06/2017   EF 25 and 30%. GR 1 DD. Mild diastolic dysfunction with elevated LVEDP. Mild valvular disease.   TRANSTHORACIC ECHOCARDIOGRAM  11'18, 6/'19    a) EF remains 30-35%.  Diffuse hypokinesis noted.  Severe LA dilation; b) Improved EF 35-40%.  Diffuse HK.   GR 1 DD.  Moderate TR.  Mild RV dilation.   TRANSTHORACIC ECHOCARDIOGRAM  01/28/2022   Follow-up echo: EF 50 to 55%.  No RWMA.  GRII DD.  Severe LA dilation.  Mild RA dilation with normal PAP and RAP.  Mild to moderate TR (no significant change noted)   Zio patch  08/2022   3-day: Currently NSR (HR range 47-91 with average 64 bpm) 1 F AVB with IVCD/BBB.  Rare isolated PACs (<1%).  Frequent PVCs (6.1% with rare couplets and triplets.  Also bigeminy.  2 atrial runs 8 beats 5 2 seconds.  No sustained arrhythmias.   ZIO Patch 14 d Event Monitor  10/2018   Results as below < 1% PVC.  (Notably improved after starting amiodarone)     A IV Location/Drains/Wounds Patient Lines/Drains/Airways Status     Active Line/Drains/Airways     Name Placement date Placement time Site Days   Peripheral IV 03/30/23 20 G Right Antecubital 03/30/23  1844  Antecubital  1   Negative Pressure Wound Therapy Groin Right 02/06/23  1501  --  53            Intake/Output Last 24 hours  Intake/Output Summary (Last 24 hours) at 03/31/2023 1256 Last data filed at 03/31/2023 1107 Gross per 24 hour  Intake 3 ml  Output --  Net 3 ml    Labs/Imaging Results for orders placed or performed during the hospital encounter of 03/30/23 (from the past 48 hour(s))  CBC with Differential     Status: Abnormal   Collection Time: 03/30/23  6:45 PM  Result Value Ref Range   WBC 4.6 4.0 - 10.5 K/uL   RBC 3.88 3.87 - 5.11 MIL/uL   Hemoglobin 11.6 (L) 12.0 - 15.0 g/dL   HCT 16.1 09.6 - 04.5 %   MCV 96.1 80.0 - 100.0 fL   MCH 29.9 26.0 - 34.0 pg   MCHC 31.1 30.0 - 36.0 g/dL   RDW 40.9 81.1 - 91.4 %   Platelets 242 150 - 400 K/uL   nRBC 0.4 (H) 0.0 - 0.2 %   Neutrophils Relative % 55 %   Neutro Abs 2.6 1.7 - 7.7 K/uL   Lymphocytes Relative 31 %   Lymphs Abs 1.4 0.7 - 4.0 K/uL   Monocytes Relative 10 %   Monocytes Absolute 0.5 0.1 - 1.0 K/uL   Eosinophils Relative 3 %   Eosinophils Absolute 0.1 0.0 - 0.5 K/uL  Basophils Relative 1 %   Basophils Absolute 0.0 0.0 - 0.1 K/uL   Immature Granulocytes 0 %   Abs Immature Granulocytes 0.01 0.00 - 0.07 K/uL    Comment: Performed at [facility], [address] [place], [place] [address]  Comprehensive metabolic panel     Status: Abnormal   Collection Time: 03/30/23  6:45 PM  Result Value Ref Range   Sodium 133 (L) 135 - 145 mmol/L   Potassium 3.9 3.5 - 5.1 mmol/L   Chloride 97 (L) 98 - 111 mmol/L   CO2 21 (L) 22 - 32 mmol/L   Glucose, Bld 195 (H) 70 - 99 mg/dL    Comment: Glucose reference range applies only to samples taken after fasting for at least 8 hours.   BUN 42 (H) 8 - 23 mg/dL   Creatinine, Ser 6.04 (H) 0.44 - 1.00 mg/dL   Calcium 9.1 8.9 - 54.0 mg/dL   Total Protein 6.3 (L) 6.5 - 8.1 g/dL   Albumin 3.3 (L) 3.5 - 5.0 g/dL   AST 54 (H) 15 - 41 U/L   ALT 64 (H) 0 - 44 U/L   Alkaline Phosphatase 212 (H) 38 - 126 U/L   Total Bilirubin 0.5 0.3 - 1.2 mg/dL   GFR, Estimated 15 (L) >60 mL/min    Comment: (NOTE) Calculated using the CKD-EPI Creatinine Equation (2021)    Anion gap 15 5 - 15    Comment: Performed at [facility], [address], [place], [place] [address]  Lipase, blood     Status: None   Collection Time: 03/30/23  6:45 PM  Result Value Ref Range   Lipase 33 11 - 51 U/L    Comment: Performed at [facility], [address] [place], [place] [address]  Urinalysis, Routine w reflex microscopic -Urine, Clean Catch     Status: Abnormal   Collection Time: 03/30/23  6:50 PM  Result Value Ref Range   Color, Urine YELLOW YELLOW   APPearance CLOUDY (A) CLEAR   Specific Gravity, Urine 1.010 1.005 - 1.030   pH 5.0 5.0 - 8.0   Glucose, UA NEGATIVE NEGATIVE mg/dL   Hgb urine dipstick SMALL (A) NEGATIVE   Bilirubin Urine NEGATIVE NEGATIVE   Ketones, ur NEGATIVE NEGATIVE mg/dL   Protein, ur 956 (A) NEGATIVE mg/dL   Nitrite NEGATIVE NEGATIVE   Leukocytes,Ua TRACE (A) NEGATIVE   RBC / HPF 6-10 0 - 5 RBC/hpf   WBC,  UA 0-5 0 - 5 WBC/hpf   Bacteria, UA RARE (A) NONE SEEN   Squamous Epithelial / HPF 6-10 0 - 5 /HPF   Mucus PRESENT    Hyaline Casts, UA PRESENT    Non Squamous Epithelial 0-5 (A) NONE SEEN    Comment: Performed at [facility], [address] [place], [place] [address]  Troponin I (High Sensitivity)     Status: Abnormal   Collection Time: 03/31/23  7:06 AM  Result Value Ref Range   Troponin I (High Sensitivity) 45 (H) <18 ng/L    Comment: (NOTE) Elevated high sensitivity troponin I (hsTnI) values and significant  changes across serial measurements may suggest ACS but many other  chronic and acute conditions are known to elevate hsTnI results.  Refer to the "Links" section for chest pain algorithms and additional  guidance. Performed at [facility], [address], [place], [place] [address]   Basic metabolic panel     Status: Abnormal   Collection  Time: 03/31/23  7:06 AM  Result Value Ref Range   Sodium 135 135 - 145 mmol/L   Potassium 4.1 3.5 - 5.1 mmol/L   Chloride 100 98 - 111 mmol/L   CO2 21 (L) 22 - 32 mmol/L   Glucose, Bld 114 (H) 70 - 99 mg/dL    Comment: Glucose reference range applies only to samples taken after fasting for at least 8 hours.   BUN 43 (H) 8 - 23 mg/dL   Creatinine, Ser 8.65 (H) 0.44 - 1.00 mg/dL   Calcium 9.2 8.9 - 78.4 mg/dL   GFR, Estimated 15 (L) >60 mL/min    Comment: (NOTE) Calculated using the CKD-EPI Creatinine Equation (2021)    Anion gap 14 5 - 15    Comment: Performed at [facility], [address] [place], [place] [address]  Brain natriuretic peptide     Status: Abnormal   Collection Time: 03/31/23  7:06 AM  Result Value Ref Range   B Natriuretic Peptide 1,775.0 (H) 0.0 - 100.0 pg/mL    Comment: Performed at [facility], [address], [place], [place] [address]  Procalcitonin     Status: None   Collection Time: 03/31/23  7:06 AM  Result Value Ref Range   Procalcitonin 0.37 ng/mL    Comment:         Interpretation: PCT (Procalcitonin) <= 0.5 ng/mL: Systemic infection (sepsis) is not likely. Local bacterial infection is possible. (NOTE)       Sepsis PCT Algorithm           Lower Respiratory Tract                                      Infection PCT Algorithm    ----------------------------     ----------------------------         PCT < 0.25 ng/mL                PCT < 0.10 ng/mL          Strongly encourage             Strongly discourage   discontinuation of antibiotics    initiation of antibiotics    ----------------------------     -----------------------------       PCT 0.25 - 0.50 ng/mL            PCT 0.10 - 0.25 ng/mL               OR       >80% decrease in PCT            Discourage initiation of                                            antibiotics      Encourage discontinuation           of antibiotics    ----------------------------     -----------------------------         PCT >= 0.50 ng/mL              PCT 0.26 - 0.50 ng/mL               AND        <80% decrease in PCT  Encourage initiation of                                             antibiotics       Encourage continuation           of antibiotics    ----------------------------     -----------------------------        PCT >= 0.50 ng/mL                  PCT > 0.50 ng/mL               AND         increase in PCT                  Strongly encourage                                      initiation of antibiotics    Strongly encourage escalation           of antibiotics                                     -----------------------------                                           PCT <= 0.25 ng/mL                                                 OR                                        > 80% decrease in PCT                                      Discontinue / Do not initiate                                             antibiotics  Performed at [facility], [address], [place], [place] [address]    C-reactive protein     Status: None   Collection Time: 03/31/23  7:06 AM  Result Value Ref Range   CRP 0.7 <1.0 mg/dL    Comment: Performed at [facility], [address] [place], [place] [address]  CBC     Status: Abnormal   Collection Time: 03/31/23  9:30 AM  Result Value Ref Range   WBC 4.9 4.0 - 10.5 K/uL   RBC 3.80 (L) 3.87 - 5.11 MIL/uL   Hemoglobin 11.5 (L) 12.0 - 15.0 g/dL   HCT 82.9 56.2 - 13.0 %   MCV 94.7 80.0 - 100.0 fL  MCH 30.3 26.0 - 34.0 pg   MCHC 31.9 30.0 - 36.0 g/dL   RDW 16.1 09.6 - 04.5 %   Platelets 233 150 - 400 K/uL   nRBC 0.0 0.0 - 0.2 %    Comment: Performed at [facility], [address] [place], [place] [address]  Creatinine, serum     Status: Abnormal   Collection Time: 03/31/23  9:30 AM  Result Value Ref Range   Creatinine, Ser 2.75 (H) 0.44 - 1.00 mg/dL   GFR, Estimated 17 (L) >60 mL/min    Comment: (NOTE) Calculated using the CKD-EPI Creatinine Equation (2021) Performed at [facility], [address], [place], [place] [address]   Hemoglobin A1c     Status: None   Collection Time: 03/31/23  9:30 AM  Result Value Ref Range   Hgb A1c MFr Bld 5.6 4.8 - 5.6 %    Comment: (NOTE) Pre diabetes:          5.7%-6.4%  Diabetes:              >6.4%  Glycemic control for   <7.0% adults with diabetes    Mean Plasma Glucose 114.02 mg/dL    Comment: Performed at [facility], [address] [place], [place] [address]  Troponin I (High Sensitivity)     Status: Abnormal   Collection Time: 03/31/23  9:30 AM  Result Value Ref Range   Troponin I (High Sensitivity) 40 (H) <18 ng/L    Comment: (NOTE) Elevated high sensitivity troponin I (hsTnI) values and significant  changes across serial measurements may suggest ACS but many other  chronic and acute conditions are known to elevate hsTnI results.  Refer to the "Links" section for chest pain algorithms and additional  guidance. Performed at [facility], [address] [place], [place] [address]   CBG monitoring, ED     Status: None   Collection Time: 03/31/23 11:38 AM  Result Value Ref Range   Glucose-Capillary 97 70 - 99 mg/dL    Comment: Glucose reference range applies only to samples taken after fasting for at least 8 hours.   *Note: Due to a large number of results and/or encounters for the requested time period, some results have not been displayed. A complete set of results can be found in Results Review.   US Abdomen Limited RUQ (LIVER/GB)  Result Date: 03/31/2023 CLINICAL DATA:  Right upper quadrant pain EXAM: ULTRASOUND ABDOMEN LIMITED RIGHT UPPER QUADRANT COMPARISON:  CT abdomen/pelvis dated 03/30/2023 FINDINGS: Gallbladder: Echogenic focus at the gallbladder fundus, nonshadowing with corresponding to the CT finding, reflecting gallbladder adenomyomatosis rather than cholelithiasis. No cholelithiasis or significant gallbladder distension. Mild gallbladder wall edema/pericholecystic fluid. Negative sonographic Murphy's sign. Common bile duct: Diameter: 3 mm Liver: At the upper limits of normal for parenchymal echogenicity. 1.7 x 1.3 x 1.9 cm simple left hepatic lobe cyst. Portal vein is patent on color Doppler imaging with normal direction of blood flow towards the liver. Other: None. IMPRESSION: Mild gallbladder wall edema/pericholecystic fluid, without cholelithiasis or findings to suggest acute cholecystitis. This appearance may be secondary/reactive to hepatic inflammation. Gallbladder adenomyomatosis, benign. Electronically Signed   By: Charline Bills M.D.   On: 03/31/2023 01:47   CT ABDOMEN PELVIS WO CONTRAST  Result Date: 03/30/2023 CLINICAL DATA:  Abdominal pain, nausea/vomiting EXAM: CT ABDOMEN AND PELVIS WITHOUT CONTRAST TECHNIQUE: Multidetector CT imaging of the abdomen and pelvis was performed following the standard protocol without IV contrast. RADIATION DOSE  REDUCTION: This exam was performed according to the departmental  dose-optimization program which includes automated exposure control, adjustment of the mA and/or kV according to patient size and/or use of iterative reconstruction technique. COMPARISON:  01/13/2023 FINDINGS: Lower chest: Small right pleural effusion, chronic. Associated right lower lobe scarring/atelectasis. Hepatobiliary: 16 mm cyst in the left liver (series 3/image 14), benign. Suspected cholelithiasis (series 3/image 30) with additional layering gallbladder sludge versus tiny gallstones. Mild pericholecystic fluid/inflammatory changes (series 3/image 32). No intrahepatic or extrahepatic ductal dilatation. Pancreas: Within normal limits. Spleen: Within normal limits. Adrenals/Urinary Tract: 2.6 cm left adrenal nodule with macroscopic fat, favoring a benign myelolipoma, unchanged. Right adrenal gland is within normal limits. Kidneys are within normal limits. No renal calculi or hydronephrosis. Bladder is within normal limits. Stomach/Bowel: Stomach is within normal limits. No evidence of bowel obstruction. Normal appendix (series 3/image 44). Extensive sigmoid diverticulosis, without evidence of diverticulitis. Vascular/Lymphatic: No evidence of abdominal aortic aneurysm. Atherosclerotic calcifications of the abdominal aorta and branch vessels. No suspicious abdominopelvic lymphadenopathy. Reproductive: Status post hysterectomy. No adnexal masses. Other: Trace abdominopelvic ascites. Musculoskeletal: Degenerative changes of the visualized thoracolumbar spine. IMPRESSION: Cholelithiasis with mild pericholecystic fluid/inflammatory changes, raising the possibility of acute cholecystitis. If this is clinically discordant, consider right upper quadrant ultrasound for further evaluation as clinically warranted. Additional ancillary findings as above. Electronically Signed   By: Charline Bills M.D.   On: 03/30/2023 21:13    Pending Labs Unresulted Labs (From admission, onward)     Start     Ordered   04/07/23  0500  Creatinine, serum  (enoxaparin (LOVENOX)    CrCl < 30 ml/min)  Once,   R       Comments: while on enoxaparin therapy.    03/31/23 0715   04/01/23 0500  Comprehensive metabolic panel  Tomorrow morning,   R        03/31/23 0715   04/01/23 0500  CBC  Tomorrow morning,   R        03/31/23 0715   03/31/23 0649  Gastrointestinal Panel by PCR , Stool  (Gastrointestinal Panel by PCR, Stool                                                                                                                                                     **Does Not include CLOSTRIDIUM DIFFICILE testing. **If CDIFF testing is needed, place order from the "C Difficile Testing" order set.**)  Once,   URGENT        03/31/23 0648   03/31/23 0647  Urinalysis, w/ Reflex to Culture (Infection Suspected) -Urine, Catheterized  (Urine Labs)  Once,   URGENT       Question:  Specimen Source  Answer:  Urine, Catheterized   03/31/23 0646            Vitals/Pain Today's Vitals  03/31/23 1105 03/31/23 1111 03/31/23 1115 03/31/23 1130  BP: 114/77  118/75 111/79  Pulse: 70   70  Resp: (!) 22  20 (!) 25  Temp:  98.2 F (36.8 C)    TempSrc:  Oral    SpO2: 97%   98%  Weight:      Height:      PainSc:        Isolation Precautions Enteric precautions (UV disinfection)  Medications Medications  0.9 %  sodium chloride infusion ( Intravenous New Bag/Given 03/31/23 1107)  enoxaparin (LOVENOX) injection 30 mg (30 mg Subcutaneous Not Given 03/31/23 1107)  sodium chloride flush (NS) 0.9 % injection 3 mL (3 mLs Intravenous Given 03/31/23 1107)  acetaminophen (TYLENOL) tablet 650 mg (has no administration in time range)    Or  acetaminophen (TYLENOL) suppository 650 mg (has no administration in time range)  ondansetron (ZOFRAN) tablet 4 mg (has no administration in time range)    Or  ondansetron (ZOFRAN) injection 4 mg (has no administration in time range)  insulin aspart (novoLOG) injection 0-9 Units ( Subcutaneous Not  Given 03/31/23 1209)  insulin aspart (novoLOG) injection 0-5 Units (has no administration in time range)  sodium chloride 0.9 % bolus 500 mL (0 mLs Intravenous Stopped 03/31/23 0607)    Mobility walks with device     Focused Assessments Cardiac Assessment Handoff:    No results found for: "CKTOTAL", "CKMB", "CKMBINDEX", "TROPONINI" Lab Results  Component Value Date   DDIMER (H) 12/03/2008    0.51        AT THE INHOUSE ESTABLISHED CUTOFF VALUE OF 0.48 ug/mL FEU, THIS ASSAY HAS BEEN DOCUMENTED IN THE LITERATURE TO HAVE   Does the Patient currently have chest pain? No  Abdominal pain resolved.  R Recommendations: See Admitting Provider Note  Report given to:   Additional Notes:

## 2023-03-31 NOTE — ED Notes (Signed)
Patient resting in bed with eyes closed, no s/s of distress, will continue to monitor.  

## 2023-03-31 NOTE — Progress Notes (Signed)
Pt now feels like she can take her Eliquis so will resume that and he Palvix for CAD

## 2023-03-31 NOTE — ED Provider Notes (Signed)
[place] EMERGENCY DEPARTMENT AT [facility] Provider Note   CSN: [id number] Arrival date & time: 03/30/23  1810     History  Chief Complaint  Patient presents with   Abdominal Pain    Natasha Woodard is a 82 y.o. female.  The history is provided by the patient and the spouse.   Patient with extensive history including cardiomyopathy, CHF, diabetes, ICD in place presents with nausea vomiting She reports abdominal discomfort for the past several days, has been having nonbloody emesis, but no diarrhea.  No active chest pain.  She has had normal bowel movements.    Past Medical History:  Diagnosis Date   AICD (automatic cardioverter/defibrillator) present    Calcific tendonitis 2008   Treatment of left leg   Cardiomyopathy 06/2017   a) Echo: EF 25-30%. GR 1-2 DD w/ elevated LVEDP. Mild valvular Dz; b) Cardiac MRI 2/'19: frequent PVCs (diffiuclt to interpret) - EF ~27% w/ diffuse HK.  No evidence of infarct, infiltrative Dz or myocarditis. -- ? if related to PVCs. c) f/u Echo 6/'19: EF 35-40%. Gr 1 DD. Diffuse HK.-> d) Jan 2020 EF 50 to 55%.  Mod MAC, mod LAE.  Ao Sclerosis; e) Echo 1/'21 - EF 60-65%, Gr II DD. Mild Mod MR.    Chronic combined systolic and diastolic CHF, NYHA class 2 and ACA/AHA stage C    Now essentially resolved, back to simply diastolic CHF   Coronary artery disease, non-occlusive    mild-moderate CAD 06/30/17 cath   CTS (carpal tunnel syndrome)    Diabetes mellitus    Type II   Dysrhythmia    patient said that she cant remember what it is   Frequent unifocal PVCs 01/2018   Event Monitor: NSR, W/ frequent multifocal PVCs (9%-down from 30% prior to amiodarone) and PACs.  Nighttime bradycardia suggestive of OSA.';  Zio patch September 2023: PVC burden now 6.1%.   Hiatal hernia    with reflux   Hyperlipidemia    Statin intolerant   Hypertension    Ichthyosis congenita    Intermittent claudication of both lower extremities due to atherosclerosis  08/22/2021   LEA Dopplers 09/09/2021:  Right: Total occlusion noted in the superficial femoral artery. Atherosclerosis noted throughout extremity, see note above. Three vessel runoff.  Left: Total occlusion noted in the superficial femoral artery and/or popliteal artery. Total occlusion noted in the distal anterior tibial artery. Athereosclerosis noted throughout extremity.    NSVD (normal spontaneous vaginal delivery) 1971 & 1972   Obesity    Plantar fasciitis    Left   Presence of permanent cardiac pacemaker    Pulmonary nodule    Imaged multiple times and benign appearing   Sleep apnea    Wears glasses     Home Medications Prior to Admission medications   Medication Sig Start Date End Date Taking? Authorizing Provider  acetaminophen (TYLENOL) 325 MG tablet Take 1-2 tablets (325-650 mg total) by mouth every 6 (six) hours as needed. 02/28/23   Setzer, Lynnell Jude, PA-C  amiodarone (PACERONE) 200 MG tablet Take 1 tablet (200 mg total) by mouth daily. 02/28/23 03/30/23  Setzer, Lynnell Jude, PA-C  apixaban (ELIQUIS) 2.5 MG TABS tablet Take 1 tablet (2.5 mg total) by mouth 2 (two) times daily. 02/28/23 03/30/23  Setzer, Lynnell Jude, PA-C  glucose blood (ACCU-CHEK AVIVA PLUS) test strip USE TO TEST BLOOD SUGAR ONCE DAILY 03/23/23   [name], MD  Multiple Vitamin (MULTIVITAMIN WITH MINERALS) TABS tablet Take 1 tablet  by mouth daily. 03/01/23   Setzer, Lynnell Jude, PA-C  ondansetron (ZOFRAN) 4 MG tablet Take 1 tablet (4 mg total) by mouth every 8 (eight) hours as needed for nausea or vomiting. 03/27/23   Elijah Birk C, DO  rosuvastatin (CRESTOR) 10 MG tablet Take 1 tablet (10 mg total) by mouth daily. TAKE 1 TABLET(10 MG) BY MOUTH DAILY Strength: 10 mg 02/28/23   Setzer, Lynnell Jude, PA-C  torsemide (DEMADEX) 20 MG tablet Take 1 tablet (20 mg total) by mouth daily. 02/28/23 03/30/23  Setzer, Lynnell Jude, PA-C  traMADol (ULTRAM) 50 MG tablet Take 1 tablet (50 mg total) by mouth 2 (two) times daily. 02/28/23   Setzer,  Lynnell Jude, PA-C      Allergies    Ezetimibe-simvastatin, Lipitor [atorvastatin], Claritin [loratadine], Spironolactone, Tamiflu [oseltamivir phosphate], Pravastatin sodium, and Sulfonamide derivatives    Review of Systems   Review of Systems  Constitutional:  Negative for fever.  Cardiovascular:  Negative for chest pain.  Gastrointestinal:  Positive for nausea and vomiting.    Physical Exam Updated Vital Signs BP 126/72   Pulse 72   Temp 98.6 F (37 C) (Oral)   Resp (!) 21   Ht 1.575 m ( )   Wt 65.8 kg   SpO2 95%   BMI 26.52 kg/m  Physical Exam CONSTITUTIONAL: Elderly, no acute distress HEAD: Normocephalic/atraumatic EYES: EOMI/PERRL ENMT: Mucous membranes dry NECK: supple no meningeal signs SPINE/BACK:entire spine nontender CV: S1/S2 noted, no murmurs/rubs/gallops noted LUNGS: Lungs are clear to auscultation bilaterally, no apparent distress ABDOMEN: soft, nontender, no rebound or guarding, bowel sounds noted throughout abdomen NEURO: Pt is awake/alert/appropriate, moves all extremitiesx4.  No facial droop.   EXTREMITIES: pulses normal/equal, full ROM SKIN: warm, color normal PSYCH: no abnormalities of mood noted, alert and oriented to situation  ED Results / Procedures / Treatments   Labs (all labs ordered are listed, but only abnormal results are displayed) Labs Reviewed  CBC WITH DIFFERENTIAL/PLATELET - Abnormal; Notable for the following components:      Result Value   Hemoglobin 11.6 (*)    nRBC 0.4 (*)    All other components within normal limits  COMPREHENSIVE METABOLIC PANEL - Abnormal; Notable for the following components:   Sodium 133 (*)    Chloride 97 (*)    CO2 21 (*)    Glucose, Bld 195 (*)    BUN 42 (*)    Creatinine, Ser 3.06 (*)    Total Protein 6.3 (*)    Albumin 3.3 (*)    AST 54 (*)    ALT 64 (*)    Alkaline Phosphatase 212 (*)    GFR, Estimated 15 (*)    All other components within normal limits  URINALYSIS, ROUTINE W REFLEX  MICROSCOPIC - Abnormal; Notable for the following components:   APPearance CLOUDY (*)    Hgb urine dipstick SMALL (*)    Protein, ur 100 (*)    Leukocytes,Ua TRACE (*)    Bacteria, UA RARE (*)    Non Squamous Epithelial 0-5 (*)    All other components within normal limits  GASTROINTESTINAL PANEL BY PCR, STOOL (REPLACES STOOL CULTURE)  LIPASE, BLOOD  URINALYSIS, W/ REFLEX TO CULTURE (INFECTION SUSPECTED)  BASIC METABOLIC PANEL  BRAIN NATRIURETIC PEPTIDE  PROCALCITONIN  C-REACTIVE PROTEIN  TROPONIN I (HIGH SENSITIVITY)    EKG EKG Interpretation  Date/Time:  Friday March 31 2023 02:47:30 EDT Ventricular Rate:  176 PR Interval:  127 QRS Duration: 158 QT Interval:    QTC  Calculation:   R Axis:   -20 Text Interpretation: paced rhythm Right bundle branch block No significant change since last tracing Confirmed by Zadie Rhine ([id number]) on 03/31/2023 3:22:21 AM  Radiology US Abdomen Limited RUQ (LIVER/GB)  Result Date: 03/31/2023 CLINICAL DATA:  Right upper quadrant pain EXAM: ULTRASOUND ABDOMEN LIMITED RIGHT UPPER QUADRANT COMPARISON:  CT abdomen/pelvis dated 03/30/2023 FINDINGS: Gallbladder: Echogenic focus at the gallbladder fundus, nonshadowing with corresponding to the CT finding, reflecting gallbladder adenomyomatosis rather than cholelithiasis. No cholelithiasis or significant gallbladder distension. Mild gallbladder wall edema/pericholecystic fluid. Negative sonographic Murphy's sign. Common bile duct: Diameter: 3 mm Liver: At the upper limits of normal for parenchymal echogenicity. 1.7 x 1.3 x 1.9 cm simple left hepatic lobe cyst. Portal vein is patent on color Doppler imaging with normal direction of blood flow towards the liver. Other: None. IMPRESSION: Mild gallbladder wall edema/pericholecystic fluid, without cholelithiasis or findings to suggest acute cholecystitis. This appearance may be secondary/reactive to hepatic inflammation. Gallbladder adenomyomatosis, benign.  Electronically Signed   By: Charline Bills M.D.   On: 03/31/2023 01:47   CT ABDOMEN PELVIS WO CONTRAST  Result Date: 03/30/2023 CLINICAL DATA:  Abdominal pain, nausea/vomiting EXAM: CT ABDOMEN AND PELVIS WITHOUT CONTRAST TECHNIQUE: Multidetector CT imaging of the abdomen and pelvis was performed following the standard protocol without IV contrast. RADIATION DOSE REDUCTION: This exam was performed according to the departmental dose-optimization program which includes automated exposure control, adjustment of the mA and/or kV according to patient size and/or use of iterative reconstruction technique. COMPARISON:  01/13/2023 FINDINGS: Lower chest: Small right pleural effusion, chronic. Associated right lower lobe scarring/atelectasis. Hepatobiliary: 16 mm cyst in the left liver (series 3/image 14), benign. Suspected cholelithiasis (series 3/image 30) with additional layering gallbladder sludge versus tiny gallstones. Mild pericholecystic fluid/inflammatory changes (series 3/image 32). No intrahepatic or extrahepatic ductal dilatation. Pancreas: Within normal limits. Spleen: Within normal limits. Adrenals/Urinary Tract: 2.6 cm left adrenal nodule with macroscopic fat, favoring a benign myelolipoma, unchanged. Right adrenal gland is within normal limits. Kidneys are within normal limits. No renal calculi or hydronephrosis. Bladder is within normal limits. Stomach/Bowel: Stomach is within normal limits. No evidence of bowel obstruction. Normal appendix (series 3/image 44). Extensive sigmoid diverticulosis, without evidence of diverticulitis. Vascular/Lymphatic: No evidence of abdominal aortic aneurysm. Atherosclerotic calcifications of the abdominal aorta and branch vessels. No suspicious abdominopelvic lymphadenopathy. Reproductive: Status post hysterectomy. No adnexal masses. Other: Trace abdominopelvic ascites. Musculoskeletal: Degenerative changes of the visualized thoracolumbar spine. IMPRESSION:  Cholelithiasis with mild pericholecystic fluid/inflammatory changes, raising the possibility of acute cholecystitis. If this is clinically discordant, consider right upper quadrant ultrasound for further evaluation as clinically warranted. Additional ancillary findings as above. Electronically Signed   By: Charline Bills M.D.   On: 03/30/2023 21:13    Procedures Procedures    Medications Ordered in ED Medications  0.9 %  sodium chloride infusion (has no administration in time range)  sodium chloride 0.9 % bolus 500 mL (0 mLs Intravenous Stopped 03/31/23 6045)    ED Course/ Medical Decision Making/ A&P Clinical Course as of 03/31/23 0710  Fri Mar 31, 2023  4098 Creatinine(!): 3.06 Acute kidney injury [DW]  0709 Patient with recent nonbloody emesis and abdominal pain and lack of appetite.  Her PCP has been try to manage this as an outpatient and altering and discontinuing medications.  However patient is still symptomatic.  She has evidence of acute kidney injury.  She also has a history of CHF which limits the volume of fluids can be given  in the Emergency Department.  I advised patient admission for correction of her electrolytes and monitoring.  Imaging did reveal gallbladder wall edema but no signs of cholecystitis or cholelithiasis [DW]  0710 Discussed with Dr. Haroldine Laws for admission [DW]    Clinical Course User Index [DW] Zadie Rhine, MD                             Medical Decision Making Amount and/or Complexity of Data Reviewed Labs:  Decision-making details documented in ED Course. ECG/medicine tests: ordered.  Risk Decision regarding hospitalization.   This patient presents to the ED for concern of abdominal pain, this involves an extensive number of treatment options, and is a complaint that carries with it a high risk of complications and morbidity.  The differential diagnosis includes but is not limited to cholecystitis, cholelithiasis, pancreatitis, gastritis,  peptic ulcer disease, appendicitis, bowel obstruction, bowel perforation, diverticulitis, AAA, ischemic bowel   Comorbidities that complicate the patient evaluation: Patient's presentation is complicated by their history of CHF  Social Determinants of Health: Patient's  hard of hearing   increases the complexity of managing their presentation  Additional history obtained: Additional history obtained from spouse Records reviewed Primary Care Documents  Lab Tests: I Ordered, and personally interpreted labs.  The pertinent results include: Acute kidney injury  Imaging Studies ordered: I ordered imaging studies including Ultrasound right upper quadrant   I independently visualized and interpreted imaging which showed gallbladder edema I agree with the radiologist interpretation  Cardiac Monitoring: The patient was maintained on a cardiac monitor.  I personally viewed and interpreted the cardiac monitor which showed an underlying rhythm of:   Paced rhythm  Medicines ordered and prescription drug management: I ordered medication including 500 cc fluid bolus for acute kidney injury Reevaluation of the patient after these medicines showed that the patient    improved   Critical Interventions:   admission for management of acute kidney injury, IV fluids  Consultations Obtained: I requested consultation with the admitting physician triad dr Haroldine Laws , and discussed  findings as well as pertinent plan - they recommend: admit  Reevaluation: After the interventions noted above, I reevaluated the patient and found that they have :stayed the same  Complexity of problems addressed: Patient's presentation is most consistent with  acute presentation with potential threat to life or bodily function  Disposition: After consideration of the diagnostic results and the patient's response to treatment,  I feel that the patent would benefit from admission   .           Final Clinical  Impression(s) / ED Diagnoses Final diagnoses:  AKI (acute kidney injury)    Rx / DC Orders ED Discharge Orders     None         Zadie Rhine, MD 03/31/23 [phone]

## 2023-03-31 NOTE — H&P (Signed)
History and Physical    Patient: Natasha Woodard WGN:[id number] DOB: 1941-06-16 DOA: 03/30/2023 DOS: the patient was seen and examined on 03/31/2023 PCP: [name], MD  Patient coming from: Home-lives with husband  Chief Complaint:  Chief Complaint  Patient presents with   Abdominal Pain   HPI: Natasha Woodard is a 82 y.o. female with medical history significant of CAD status post MI February 2024 with multiple complications including post MI cardiac arrest, requirement for intra-aortic balloon pump and eventual placement of AICD.  She also has diabetes mellitus, peripheral vascular disease, sleep apnea and dyslipidemia.  She has had ongoing heart failure issues for years.  She has been diagnosed with HFrEF With medical therapy improved to the point she had recovered her EF.  Unfortunately after her last event in February her EF was once more decreased to 20 to 25%.  Patient presented to the hospital after experiencing what sounds like viral enteritis symptoms primarily with nausea and vomiting for multiple days.  Her primary care physician had been attempting to manage her but she became too weak in her primary care physician told her to present to the hospital.  She had lost 6 pounds since the onset of her symptoms.  She had generalized diffuse abdominal pain and had continued to take some of her normal medications but unfortunately continued to have episodes of emesis and was unable to tolerate her medications.  She had no diarrhea, no fevers, no known sick contacts.  She was brought to the ER for further evaluation and treatment.  At presentation she was afebrile, essentially normotensive, and was not requiring oxygen.  She was found to have acute kidney injury with a BUN of 42 and a creatinine of 3.06 noting her baseline creatinine typically between 1.7 and 1.8.  Imaging including CT of the abdomen and ultrasound did not reveal an etiology for her pain.  She has been hydrated and her  electrolytes have improved.  She is reporting improvement in her symptoms.  She is requesting clear liquids.  Her troponin readings have been slightly elevated in the 40s but have remained flat and she is not complaining of any chest pain or other typical ischemic symptoms.  Her defibrillator has not discharged since onset of her symptoms.  Her urinalysis is slightly abnormal which could be explained by dehydration.  EKG was unremarkable.  Plan is to admit the patient for electrolyte disturbance in the context of recurrent nausea and vomiting for greater than 4 days.   Review of Systems: As mentioned in the history of present illness. All other systems reviewed and are negative. Past Medical History:  Diagnosis Date   AICD (automatic cardioverter/defibrillator) present    Calcific tendonitis 2008   Treatment of left leg   Cardiomyopathy 06/2017   a) Echo: EF 25-30%. GR 1-2 DD w/ elevated LVEDP. Mild valvular Dz; b) Cardiac MRI 2/'19: frequent PVCs (diffiuclt to interpret) - EF ~27% w/ diffuse HK.  No evidence of infarct, infiltrative Dz or myocarditis. -- ? if related to PVCs. c) f/u Echo 6/'19: EF 35-40%. Gr 1 DD. Diffuse HK.-> d) Jan 2020 EF 50 to 55%.  Mod MAC, mod LAE.  Ao Sclerosis; e) Echo 1/'21 - EF 60-65%, Gr II DD. Mild Mod MR.    Chronic combined systolic and diastolic CHF, NYHA class 2 and ACA/AHA stage C    Now essentially resolved, back to simply diastolic CHF   Coronary artery disease, non-occlusive    mild-moderate CAD 06/30/17 cath  CTS (carpal tunnel syndrome)    Diabetes mellitus    Type II   Dysrhythmia    patient said that she cant remember what it is   Frequent unifocal PVCs 01/2018   Event Monitor: NSR, W/ frequent multifocal PVCs (9%-down from 30% prior to amiodarone) and PACs.  Nighttime bradycardia suggestive of OSA.';  Zio patch September 2023: PVC burden now 6.1%.   Hiatal hernia    with reflux   Hyperlipidemia    Statin intolerant   Hypertension    Ichthyosis  congenita    Intermittent claudication of both lower extremities due to atherosclerosis 08/22/2021   LEA Dopplers 09/09/2021:  Right: Total occlusion noted in the superficial femoral artery. Atherosclerosis noted throughout extremity, see note above. Three vessel runoff.  Left: Total occlusion noted in the superficial femoral artery and/or popliteal artery. Total occlusion noted in the distal anterior tibial artery. Athereosclerosis noted throughout extremity.    NSVD (normal spontaneous vaginal delivery) 1971 & 1972   Obesity    Plantar fasciitis    Left   Presence of permanent cardiac pacemaker    Pulmonary nodule    Imaged multiple times and benign appearing   Sleep apnea    Wears glasses    Past Surgical History:  Procedure Laterality Date   ABDOMINAL HYSTERECTOMY  1975   TAH-BSO   ABSCESS DRAINAGE  1972   right breast   APPLICATION OF WOUND VAC Right 01/15/2023   Procedure: APPLICATION OF INCISIONAL WOUND VAC;  Surgeon: Victorino Sparrow, MD;  Location: [facility] OR;  Service: Vascular;  Laterality: Right;   APPLICATION OF WOUND VAC Right 01/26/2023   Procedure: APPLICATION OF WOUND VAC RIGHT GROIN WASHOUT AND SARTORIUS MUSCLE FLAP;  Surgeon: Victorino Sparrow, MD;  Location: [facility] OR;  Service: Vascular;  Laterality: Right;   ARTERY REPAIR Right 01/15/2023   Procedure: RIGHT PROFUNDA ARTERY REPAIR WITH 2000G MYRIAD MORCELLS;  Surgeon: Victorino Sparrow, MD;  Location: [facility] OR;  Service: Vascular;  Laterality: Right;   BREAST BIOPSY Left 01/14/2009   Stereo- Benign   CARDIAC MRI  01/2018   Difficult to interpret 2/2 PVCs. Normal LV size - EF ~27% with diffuse HK. Mild RV dilation - normal fxn. -- NO MYOCARDIAL LGI => no definitive evidence of prior MI, Infiltrative Dz or Myocarditis -- suspect NICM, possibly related to PVCs.   CARPAL TUNNEL RELEASE Right 07/02/2014   Procedure: RIGHT CARPAL TUNNEL RELEASE;  Surgeon: Nicki Reaper, MD;  Location: [facility];  Service: Orthopedics;   Laterality: Right;   CARPAL TUNNEL RELEASE Left 06/11/2015   Procedure: LEFT CARPAL TUNNEL RELEASE;  Surgeon: Cindee Salt, MD;  Location: [facility];  Service: Orthopedics;  Laterality: Left;  REGIONAL/FAB   COLONOSCOPY     corn removal  1968   right   CORONARY STENT INTERVENTION N/A 01/10/2023   Procedure: CORONARY STENT INTERVENTION;  Surgeon: Lennette Bihari, MD;  Location: [facility] INVASIVE CV LAB;  Service: Cardiovascular;  Laterality: N/A;   GROIN DEBRIDEMENT Right 02/06/2023   Procedure: RIGHT GROIN DEBRIDEMENT WITH WOUND VAC CHANGE;  Surgeon: Victorino Sparrow, MD;  Location: [facility] OR;  Service: Vascular;  Laterality: Right;   HEMATOMA EVACUATION Right 01/15/2023   Procedure: EVACUATION HEMATOMA RIGHT GROIN;  Surgeon: Victorino Sparrow, MD;  Location: [facility] OR;  Service: Vascular;  Laterality: Right;   Holter Monitor  06/2017   ~17,000 PVC beats - majority were singlets, some couplets. 44 brief 3-7 beat runs of NSVT. Also noted  were less frequent PACs with 11 runs (longest 15 beats)   IABP INSERTION Right 01/10/2023   Procedure: IABP Insertion;  Surgeon: Lennette Bihari, MD;  Location: [facility] INVASIVE CV LAB;  Service: Cardiovascular;  Laterality: Right;   ICD IMPLANT N/A 01/23/2023   Procedure: ICD IMPLANT;  Surgeon: Marinus Maw, MD;  Location: [facility] INVASIVE CV LAB;  Service: Cardiovascular;  Laterality: N/A;   INCISION AND DRAINAGE OF WOUND Right 01/31/2023   Procedure: IRRIGATION AND DEBRIDEMENT RIGHT GROIN WOUND AND WOUND VAC CHANGE;  Surgeon: Victorino Sparrow, MD;  Location: [facility] OR;  Service: Vascular;  Laterality: Right;   OPEN REDUCTION INTERNAL FIXATION (ORIF) DISTAL RADIAL FRACTURE Left 07/21/2017   Procedure: OPEN REDUCTION INTERNAL FIXATION (ORIF) LEFT DISTAL RADIAL FRACTURE;  Surgeon: Sheral Apley, MD;  Location: [facility] OR;  Service: Orthopedics;  Laterality: Left;   RIGHT/LEFT HEART CATH AND CORONARY ANGIOGRAPHY N/A 06/30/2017   Procedure: Right/Left Heart Cath and Coronary  Angiography;  Surgeon: Marykay Lex, MD;  Location: [facility] INVASIVE CV LAB:  pRCA 55%, pCx 40%, OM1 45%, mCx 50%, D2 50% - LVEF 25-35%. Moderately elevated LVEDP(26 mmHg with PCWP 16 mmHg).  FICK CO/CI: 4.47/2.48. PA pressures 47/14 mmHg with a mean of 27 mm.   RIGHT/LEFT HEART CATH AND CORONARY ANGIOGRAPHY N/A 01/10/2023   Procedure: RIGHT/LEFT HEART CATH AND CORONARY ANGIOGRAPHY;  Surgeon: Lennette Bihari, MD;  Location: [facility] INVASIVE CV LAB;  Service: Cardiovascular;  Laterality: N/A;   ROTATOR CUFF REPAIR     left shoulder   TEMPORARY PACEMAKER N/A 01/19/2023   Procedure: TEMPORARY PACEMAKER;  Surgeon: Dolores Patty, MD;  Location: [facility] INVASIVE CV LAB;  Service: Cardiovascular;  Laterality: N/A;   TRANSTHORACIC ECHOCARDIOGRAM  06/2017   EF 25 and 30%. GR 1 DD. Mild diastolic dysfunction with elevated LVEDP. Mild valvular disease.   TRANSTHORACIC ECHOCARDIOGRAM  11'18, 6/'19    a) EF remains 30-35%.  Diffuse hypokinesis noted.  Severe LA dilation; b) Improved EF 35-40%.  Diffuse HK.  GR 1 DD.  Moderate TR.  Mild RV dilation.   TRANSTHORACIC ECHOCARDIOGRAM  01/28/2022   Follow-up echo: EF 50 to 55%.  No RWMA.  GRII DD.  Severe LA dilation.  Mild RA dilation with normal PAP and RAP.  Mild to moderate TR (no significant change noted)   Zio patch  08/2022   3-day: Currently NSR (HR range 47-91 with average 64 bpm) 1 F AVB with IVCD/BBB.  Rare isolated PACs (<1%).  Frequent PVCs (6.1% with rare couplets and triplets.  Also bigeminy.  2 atrial runs 8 beats 5 2 seconds.  No sustained arrhythmias.   ZIO Patch 14 d Event Monitor  10/2018   Results as below < 1% PVC.  (Notably improved after starting amiodarone)   Social History:  reports that she quit smoking about 29 years ago. Her smoking use included cigarettes. She has never been exposed to tobacco smoke. She has never used smokeless tobacco. She reports that she does not drink alcohol and does not use drugs.  Allergies  Allergen Reactions    Ezetimibe-Simvastatin Other (See Comments)    REACTION: Muscle aches (side effect)   Lipitor [Atorvastatin] Other (See Comments)    Leg weakness    Claritin [Loratadine]     Possible cause of nightmares.     Spironolactone     Caution re: elevated potassium/creatinine   Tamiflu [Oseltamivir Phosphate] Other (See Comments)    nightmares   Pravastatin Sodium Other (See Comments)  REACTION: Muscle aches (side effect)   Sulfonamide Derivatives Nausea And Vomiting    Family History  Problem Relation Age of Onset   Heart disease Mother        s/p pacemaker   Diabetes Mother    Heart disease Father    Diabetes Father    Heart disease Brother        MI   Cancer Brother        Lung   Hypertension Other    Colon cancer Neg Hx    Breast cancer Neg Hx     Prior to Admission medications   Medication Sig Start Date End Date Taking? Authorizing Provider  acetaminophen (TYLENOL) 325 MG tablet Take 1-2 tablets (325-650 mg total) by mouth every 6 (six) hours as needed. 02/28/23   Setzer, Lynnell Jude, PA-C  amiodarone (PACERONE) 200 MG tablet Take 1 tablet (200 mg total) by mouth daily. 02/28/23 03/30/23  Setzer, Lynnell Jude, PA-C  apixaban (ELIQUIS) 2.5 MG TABS tablet Take 1 tablet (2.5 mg total) by mouth 2 (two) times daily. 02/28/23 03/30/23  Setzer, Lynnell Jude, PA-C  glucose blood (ACCU-CHEK AVIVA PLUS) test strip USE TO TEST BLOOD SUGAR ONCE DAILY 03/23/23   [name], MD  Multiple Vitamin (MULTIVITAMIN WITH MINERALS) TABS tablet Take 1 tablet by mouth daily. 03/01/23   Setzer, Lynnell Jude, PA-C  ondansetron (ZOFRAN) 4 MG tablet Take 1 tablet (4 mg total) by mouth every 8 (eight) hours as needed for nausea or vomiting. 03/27/23   Elijah Birk C, DO  rosuvastatin (CRESTOR) 10 MG tablet Take 1 tablet (10 mg total) by mouth daily. TAKE 1 TABLET(10 MG) BY MOUTH DAILY Strength: 10 mg 02/28/23   Setzer, Lynnell Jude, PA-C  torsemide (DEMADEX) 20 MG tablet Take 1 tablet (20 mg total) by mouth daily. 02/28/23  03/30/23  Setzer, Lynnell Jude, PA-C  traMADol (ULTRAM) 50 MG tablet Take 1 tablet (50 mg total) by mouth 2 (two) times daily. 02/28/23   Milinda Antis, PA-C    Physical Exam: Vitals:   03/31/23 0246 03/31/23 0500 03/31/23 0630 03/31/23 0652  BP: 112/69 120/84 126/72   Pulse: 70 71 72   Resp: (!) 24 (!) 28 (!) 21   Temp: 97.7 F (36.5 C)   98.6 F (37 C)  TempSrc: Oral   Oral  SpO2: 100% 97% 95%   Weight:      Height:       Constitutional: NAD, calm, comfortable-she is able to sit on the side of the bed without any reports of abdominal pain or dizziness Respiratory: clear to auscultation bilaterally, no wheezing, no crackles. Normal respiratory effort. No accessory muscle use.  Cardiovascular: Regular rate and rhythm, no murmurs / rubs / gallops. No significant extremity edema although she does have new swelling around the ankle and distal tibialis areas of the right lower extremity. 2+ pedal pulses.  No orthostatic hypotension noted with sitting on the side of the bed and patient was able to ambulate with the assistance of staff and her rollator to the bathroom without difficulty Abdomen: no tenderness, no masses palpated. Bowel sounds positive.  Musculoskeletal: no clubbing / cyanosis. No joint deformity upper and lower extremities. Good ROM, no contractures. Normal muscle tone.  Skin: no rashes, lesions, ulcers. No induration Neurologic: CN 2-12 grossly intact. Sensation intact,Strength 5/5 x all 4 extremities.  Psychiatric: Normal judgment and insight. Alert and oriented x 3. Normal mood.    Data Reviewed:  As per HPI.  Assessment and  plan: Intractable nausea and vomiting Seems to be improving and likely related to a viral gastroenteritis Worsening symptoms likely related to electrolyte derangements Continue to hydrate and slowly advance diet Symptom management with antiemetics  Acute kidney injury on chronic kidney disease stage III Improving with hydration Avoid  hypoperfusion  Mildly elevated troponin Likely due to demand ischemia from dehydration No ischemic symptoms and EKG within normal limits for this patient  History of HFrEF/AICD Patient at this time appears dehydrated We will hold preadmission diuretics and antihypertension medications including ARB in the context of acute kidney injury (she was on Cozaar, spironolactone, furosemide) Will need to continue to follow labs and determine when appropriate to resume above medications Patient does have a history of ventricular arrest after her MI in February and now has an AICD in place  History of CAD/prior MI 2024-history of concomitant cardiogenic shock Can resume Plavix once able to keep oral medications down without vomiting  History of VT arrest in February 2024/known PAF Hold amiodarone while having acute GI illness-currently maintaining atrial fibrillation with controlled ventricular response On Eliquis prior to admission-until patient can tolerate oral medications will change to IV heparin given poor renal function  Mild swelling right lower extremity Patient has been off her anticoagulation for PAF for multiple days and has been very sick and dehydrated As a precaution we will check lower extremity duplex to rule out DVT in context of acute illness   Advance Care Planning:   Code Status: Full Code   VTE prophylaxis: On Eliquis at home but given her recurrent nausea and vomiting it is apparent she probably has not been fully anticoagulated for 4 to 6 days.  Will utilize IV heparin while in the hospital until patient able to tolerate oral anticoagulants  Consults: None  Family Communication: Husband at bedside  Severity of Illness: The appropriate patient status for this patient is INPATIENT. Inpatient status is judged to be reasonable and necessary in order to provide the required intensity of service to ensure the patient's safety. The patient's presenting symptoms, physical exam  findings, and initial radiographic and laboratory data in the context of their chronic comorbidities is felt to place them at high risk for further clinical deterioration. Furthermore, it is not anticipated that the patient will be medically stable for discharge from the hospital within 2 midnights of admission.   * I certify that at the point of admission it is my clinical judgment that the patient will require inpatient hospital care spanning beyond 2 midnights from the point of admission due to high intensity of service, high risk for further deterioration and high frequency of surveillance required.*  Author: Junious Silk, NP 03/31/2023 7:16 AM  For on call review [email or web address].

## 2023-03-31 NOTE — Progress Notes (Signed)
Natasha Woodard is a 82 y.o. female with chronic HFpEF, dilated cardiomyopathy, frequent PVCs, hyperlipidemia, DM type II, intermittent claudication of bilateral lower extremities, CKD, OSA  Patient with recent admission 1/30 to 02/19/2023 with NSTEMI, AKI. Found to have acute lateral wall MI secondary to distal left circumflex occlusion by cardiac catheterization, complicated by severe ischemic MR and acute systolic heart failure with low output. Required placement of IABP which was later removed secondary to concern for limb ischemia. 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 Subsequently suffered a ventricular tachycardia arrest, ICD was placed, several trips to the OR for groin washout, debridement and wound VAC placement    Patient presents today with complaint of nausea, vomiting, abdominal pain, decreased p.o. intake for the last several days.  She saw her PCP who is adjusting her medication however, as patient remains symptomatic she was directed to go to the ER.  In the ER patient's creatinine elevated at 3.6 which is up from 1.8 on 03/24/2023.  Patient's with decreased EF 30 to 35% on echo done 2/70/24.    Patient also investigated in the ER for gallbladder disease as an etiology CT just done which showed IMPRESSION: Cholelithiasis with mild pericholecystic fluid/inflammatory changes, raising the possibility of acute cholecystitis. If this is clinically discordant, consider right upper quadrant ultrasound for further evaluation as clinically warranted.  Subsequent right upper quadrant ultrasound showed IMPRESSION: Mild gallbladder wall edema/pericholecystic fluid, without cholelithiasis or findings to suggest acute cholecystitis. This appearance may be secondary/reactive to hepatic inflammation.  Gallbladder adenomyomatosis, benign.  Admission requested for IV fluid hydration

## 2023-03-31 NOTE — Progress Notes (Signed)
ANTICOAGULATION CONSULT NOTE - Initial Consult  Pharmacy Consult for heparin Indication: atrial fibrillation  Allergies  Allergen Reactions   Ezetimibe-Simvastatin Other (See Comments)    REACTION: Muscle aches (side effect)   Lipitor [Atorvastatin] Other (See Comments)    Leg weakness    Claritin [Loratadine]     Possible cause of nightmares.     Spironolactone     Caution re: elevated potassium/creatinine   Tamiflu [Oseltamivir Phosphate] Other (See Comments)    nightmares   Pravastatin Sodium Other (See Comments)    REACTION: Muscle aches (side effect)   Sulfonamide Derivatives Nausea And Vomiting    Patient Measurements: Height:  (157.5 cm) Weight: 65.8 kg (145 lb) IBW/kg (Calculated) : 50.1 Heparin Dosing Weight: 63.6 kg   Vital Signs: Temp: 98.2 F (36.8 C) (04/19 1111) Temp Source: Oral (04/19 1111) BP: 111/79 (04/19 1130) Pulse Rate: 70 (04/19 1130)  Labs: Recent Labs    03/30/23 1845 03/31/23 0706 03/31/23 0930  HGB 11.6*  --   --   HCT 37.3  --   --   PLT 242  --   --   CREATININE 3.06* 2.96*  --   TROPONINIHS  --  45* 40*    Estimated Creatinine Clearance: 13 mL/min (A) (by C-G formula based on SCr of 2.96 mg/dL (H)).   Medical History: Past Medical History:  Diagnosis Date   AICD (automatic cardioverter/defibrillator) present    Calcific tendonitis 2008   Treatment of left leg   Cardiomyopathy 06/2017   a) Echo: EF 25-30%. GR 1-2 DD w/ elevated LVEDP. Mild valvular Dz; b) Cardiac MRI 2/'19: frequent PVCs (diffiuclt to interpret) - EF ~27% w/ diffuse HK.  No evidence of infarct, infiltrative Dz or myocarditis. -- ? if related to PVCs. c) f/u Echo 6/'19: EF 35-40%. Gr 1 DD. Diffuse HK.-> d) Jan 2020 EF 50 to 55%.  Mod MAC, mod LAE.  Ao Sclerosis; e) Echo 1/'21 - EF 60-65%, Gr II DD. Mild Mod MR.    Chronic combined systolic and diastolic CHF, NYHA class 2 and ACA/AHA stage C    Now essentially resolved, back to simply diastolic CHF    Coronary artery disease, non-occlusive    mild-moderate CAD 06/30/17 cath   CTS (carpal tunnel syndrome)    Diabetes mellitus    Type II   Dysrhythmia    patient said that she cant remember what it is   Frequent unifocal PVCs 01/2018   Event Monitor: NSR, W/ frequent multifocal PVCs (9%-down from 30% prior to amiodarone) and PACs.  Nighttime bradycardia suggestive of OSA.';  Zio patch September 2023: PVC burden now 6.1%.   Hiatal hernia    with reflux   Hyperlipidemia    Statin intolerant   Hypertension    Ichthyosis congenita    Intermittent claudication of both lower extremities due to atherosclerosis 08/22/2021   LEA Dopplers 09/09/2021:  Right: Total occlusion noted in the superficial femoral artery. Atherosclerosis noted throughout extremity, see note above. Three vessel runoff.  Left: Total occlusion noted in the superficial femoral artery and/or popliteal artery. Total occlusion noted in the distal anterior tibial artery. Athereosclerosis noted throughout extremity.    NSVD (normal spontaneous vaginal delivery) 1971 & 1972   Obesity    Plantar fasciitis    Left   Presence of permanent cardiac pacemaker    Pulmonary nodule    Imaged multiple times and benign appearing   Sleep apnea    Wears glasses     Medications:  (  Not in a hospital admission)  Scheduled:   insulin aspart  0-5 Units Subcutaneous QHS   insulin aspart  0-9 Units Subcutaneous TID WC   sodium chloride flush  3 mL Intravenous Q12H    Assessment: 82 YO female presenting with abdominal pain and N/V--unable to tolerate PO meds currently. She has a history of atrial fibrillation on Eliquis. Her last dose of Eliquis was 4/18 at 0930. Pharmacy has been consulted for heparin dosing.  From 4/18 CBC: Hgb 11.6 and plts 242--plts. No signs/symptoms of bleeding reported. Will avoid bolusing with heparin given last dose of Eliquis was 4/18.   Goal of Therapy:  Heparin level 0.3-0.7 units/ml Monitor platelets by  anticoagulation protocol: Yes   Plan:  Start heparin gtt @ 900 units/hr  Check heparin level and aPTT in 8hrs  Monitor aPTT/heparin level, CBC, and s/sx of bleeding daily    Cherylin Mylar, PharmD PGY1 Pharmacy Resident 4/19/20241:22 PM

## 2023-04-01 ENCOUNTER — Inpatient Hospital Stay (HOSPITAL_COMMUNITY): Payer: Medicare Other

## 2023-04-01 DIAGNOSIS — M7989 Other specified soft tissue disorders: Secondary | ICD-10-CM | POA: Diagnosis not present

## 2023-04-01 DIAGNOSIS — N183 Chronic kidney disease, stage 3 unspecified: Secondary | ICD-10-CM | POA: Diagnosis not present

## 2023-04-01 DIAGNOSIS — E1169 Type 2 diabetes mellitus with other specified complication: Secondary | ICD-10-CM

## 2023-04-01 DIAGNOSIS — E785 Hyperlipidemia, unspecified: Secondary | ICD-10-CM

## 2023-04-01 DIAGNOSIS — I10 Essential (primary) hypertension: Secondary | ICD-10-CM | POA: Diagnosis not present

## 2023-04-01 DIAGNOSIS — I420 Dilated cardiomyopathy: Secondary | ICD-10-CM | POA: Diagnosis not present

## 2023-04-01 DIAGNOSIS — R112 Nausea with vomiting, unspecified: Secondary | ICD-10-CM

## 2023-04-01 DIAGNOSIS — N179 Acute kidney failure, unspecified: Secondary | ICD-10-CM | POA: Diagnosis not present

## 2023-04-01 DIAGNOSIS — E1151 Type 2 diabetes mellitus with diabetic peripheral angiopathy without gangrene: Secondary | ICD-10-CM

## 2023-04-01 DIAGNOSIS — I480 Paroxysmal atrial fibrillation: Secondary | ICD-10-CM

## 2023-04-01 LAB — COMPREHENSIVE METABOLIC PANEL
ALT: 61 U/L — ABNORMAL HIGH (ref 0–44)
AST: 41 U/L (ref 15–41)
Albumin: 3.2 g/dL — ABNORMAL LOW (ref 3.5–5.0)
Alkaline Phosphatase: 204 U/L — ABNORMAL HIGH (ref 38–126)
Anion gap: 11 (ref 5–15)
BUN: 41 mg/dL — ABNORMAL HIGH (ref 8–23)
CO2: 22 mmol/L (ref 22–32)
Calcium: 8.9 mg/dL (ref 8.9–10.3)
Chloride: 101 mmol/L (ref 98–111)
Creatinine, Ser: 2.76 mg/dL — ABNORMAL HIGH (ref 0.44–1.00)
GFR, Estimated: 17 mL/min — ABNORMAL LOW (ref >60)
Glucose, Bld: 111 mg/dL — ABNORMAL HIGH (ref 70–99)
Potassium: 4.2 mmol/L (ref 3.5–5.1)
Sodium: 134 mmol/L — ABNORMAL LOW (ref 135–145)
Total Bilirubin: 0.7 mg/dL (ref 0.3–1.2)
Total Protein: 6.3 g/dL — ABNORMAL LOW (ref 6.5–8.1)

## 2023-04-01 LAB — CBC
HCT: 37.9 % (ref 36.0–46.0)
Hemoglobin: 11.9 g/dL — ABNORMAL LOW (ref 12.0–15.0)
MCH: 29.9 pg (ref 26.0–34.0)
MCHC: 31.4 g/dL (ref 30.0–36.0)
MCV: 95.2 fL (ref 80.0–100.0)
Platelets: 246 10*3/uL (ref 150–400)
RBC: 3.98 MIL/uL (ref 3.87–5.11)
RDW: 15 % (ref 11.5–15.5)
WBC: 4.8 10*3/uL (ref 4.0–10.5)
nRBC: 0 % (ref 0.0–0.2)

## 2023-04-01 LAB — URINALYSIS, W/ REFLEX TO CULTURE (INFECTION SUSPECTED)
Bilirubin Urine: NEGATIVE
Glucose, UA: NEGATIVE mg/dL
Ketones, ur: NEGATIVE mg/dL
Nitrite: NEGATIVE
Protein, ur: 100 mg/dL — AB
RBC / HPF: >50 RBC/hpf (ref 0–5)
Specific Gravity, Urine: 1.013 (ref 1.005–1.030)
WBC, UA: >50 WBC/hpf (ref 0–5)
pH: 5 (ref 5.0–8.0)

## 2023-04-01 LAB — GLUCOSE, CAPILLARY
Glucose-Capillary: 91 mg/dL (ref 70–99)
Glucose-Capillary: 167 mg/dL — ABNORMAL HIGH (ref 70–99)
Glucose-Capillary: 134 mg/dL — ABNORMAL HIGH (ref 70–99)
Glucose-Capillary: 105 mg/dL — ABNORMAL HIGH (ref 70–99)

## 2023-04-01 MED ORDER — AMIODARONE HCL 200 MG PO TABS
200.0000 mg | ORAL_TABLET | Freq: Every day | ORAL | Status: DC
  Administered 2023-04-01 – 2023-04-03 (×3): 200 mg via ORAL
  Filled 2023-04-01 (×3): qty 1

## 2023-04-01 MED ORDER — SODIUM CHLORIDE 0.9 % IV SOLN: INTRAVENOUS | Status: AC

## 2023-04-01 MED ORDER — SIMETHICONE 80 MG PO CHEW
80.0000 mg | CHEWABLE_TABLET | Freq: Once | ORAL | Status: AC
  Administered 2023-04-01: 80 mg via ORAL
  Filled 2023-04-01: qty 1

## 2023-04-01 NOTE — Plan of Care (Signed)
  Problem: Nutritional: Goal: Maintenance of adequate nutrition will improve Outcome: Progressing   Problem: Tissue Perfusion: Goal: Adequacy of tissue perfusion will improve Outcome: Progressing   

## 2023-04-01 NOTE — Progress Notes (Signed)
PROGRESS NOTE    Natasha Woodard  ZOX:[id number] DOB: 26-Nov-1941 DOA: 03/30/2023 PCP: [name], MD    Brief Narrative:  Natasha Woodard is a 82 y.o. female with past medical history significant of CAD status post MI February 2024 with multiple complications including post MI cardiac arrest, requirement for intra-aortic balloon pump and eventual placement of AICD, diabetes mellitus type 2, peripheral vascular disease, sleep apnea and dyslipidemia, congestive heart failure presented to hospital with nausea vomiting for several days with weakness and diffuse abdominal pain.  In the ED, patient was normotensive.  Labs showed creatinine of 3.0 from baseline 1.7-1.8.  CT scan of the abdomen pelvis and ultrasound did not show any acute findings..  Troponins 40s but have remained flat without chest pain. Her defibrillator has not discharged since onset of her symptoms.  EKG was unremarkable.  Patient was then considered for admission to the hospital for further evaluation and treatment.  Assessment and plan:   Intractable nausea and vomiting Secondary to viral gastroenteritis.  Improving.  Received IV fluids.  On clears.  Advance as tolerated soft today.  Still has some nausea and episode of diarrhea..  Acute kidney injury on chronic kidney disease stage III Improved with hydration.  Creatinine of 2.7.  Slightly improved from initial 3.0.  Will continue to monitor.  Check BMP in AM.  Mildly elevated troponin Likely demand ischemia.  No ischemic symptoms and EKG within normal range.  Was up with cardiology as outpatient.  History of HFrEF/AICD ARB and diuretics on hold since patient was initially volume depleted.  History of ventricular arrest status post AICD.  Have completed cardiac history and cardiology has been notified.   History of CAD/prior MI 2024-history of concomitant cardiogenic shock Continue Plavix.   History of VT arrest in February 2024/known PAF Continue amiodarone and  Eliquis   Mild swelling right lower extremity Lower extremity DVT was ordered pending.      DVT prophylaxis: apixaban (ELIQUIS) tablet 2.5 mg Start: 03/31/23 1400 apixaban (ELIQUIS) tablet 2.5 mg   Code Status:     Code Status: Full Code  Disposition: Home likely 04/02/2023 if remains stable.  Status is: Inpatient  Remains inpatient appropriate because: Nausea vomiting, AKI,   Family Communication:  Spoke with the patient at bedside.  Consultants:  None yet  Procedures:  None  Antimicrobials:  None  Anti-infectives (From admission, onward)    None      Subjective: Today, patient was seen and examined at bedside.  Patient denies any shortness of breath, dizziness, lightheadedness.  Had some nausea and 1 loose stool.  On clear liquids.  Wants to try to advance.  Objective: Vitals:   03/31/23 1530 03/31/23 2208 04/01/23 0418 04/01/23 0920  BP: 126/82 110/82 119/85 125/68  Pulse: 73 70 70 71  Resp: 18 (!) Temp: 98 F (36.7 C) (!) 97.5 F (36.4 C) 97.7 F (36.5 C) 97.7 F (36.5 C)  TempSrc: Oral Oral Oral Oral  SpO2: 98% 98% 98% 97%  Weight:      Height:        Intake/Output Summary (Last 24 hours) at 04/01/2023 1410 Last data filed at 04/01/2023 0900 Gross per 24 hour  Intake 1381.89 ml  Output 0 ml  Net 1381.89 ml   Filed Weights   03/30/23 1838 03/31/23 1524  Weight: 65.8 kg 70.5 kg    Physical Examination: Body mass index is 28.43 kg/m.   General:  Average built, not in obvious  distress HENT:   No scleral pallor or icterus noted. Oral mucosa is moist.  Chest:  Diminished breath sounds bilaterally. No crackles or wheezes.  CVS: S1 &S2 heard. No murmur.  Regular rate and rhythm. Abdomen: Soft, nontender, nondistended.  Bowel sounds are heard.   Extremities: No cyanosis, clubbing or edema.  Peripheral pulses are palpable. Psych: Alert, awake and oriented, normal mood CNS:  No cranial nerve deficits.  Power equal in all extremities.    Skin: Warm and dry.  No rashes noted.  Data Reviewed:   CBC: Recent Labs  Lab 03/28/23 0955 03/30/23 1845 04/01/23 0359  WBC 4.7 4.6 4.8  NEUTROABS 3.0 2.6  --   HGB 12.6 11.6* 11.9*  HCT 39.2 37.3 37.9  MCV 94.6 96.1 95.2  PLT 272.0 242 246    Basic Metabolic Panel: Recent Labs  Lab 03/28/23 0955 03/30/23 1845 03/31/23 0706 04/01/23 0359  NA 139 133* 135 134*  K 4.0 3.9 4.1 4.2  CL 99 97* 100 101  CO2 27 21* 21* 22  GLUCOSE 132* 195* 114* 111*  BUN 37* 42* 43* 41*  CREATININE 2.18* 3.06* 2.96* 2.76*  CALCIUM 9.7 9.1 9.2 8.9    Liver Function Tests: Recent Labs  Lab 03/28/23 0955 03/30/23 1845 04/01/23 0359  AST 23 54* 41  ALT 28 64* 61*  ALKPHOS 131* 212* 204*  BILITOT 0.6 0.5 0.7  PROT 6.8 6.3* 6.3*  ALBUMIN 4.0 3.3* 3.2*     Radiology Studies: US Abdomen Limited RUQ (LIVER/GB)  Result Date: 03/31/2023 CLINICAL DATA:  Right upper quadrant pain EXAM: ULTRASOUND ABDOMEN LIMITED RIGHT UPPER QUADRANT COMPARISON:  CT abdomen/pelvis dated 03/30/2023 FINDINGS: Gallbladder: Echogenic focus at the gallbladder fundus, nonshadowing with corresponding to the CT finding, reflecting gallbladder adenomyomatosis rather than cholelithiasis. No cholelithiasis or significant gallbladder distension. Mild gallbladder wall edema/pericholecystic fluid. Negative sonographic Murphy's sign. Common bile duct: Diameter: 3 mm Liver: At the upper limits of normal for parenchymal echogenicity. 1.7 x 1.3 x 1.9 cm simple left hepatic lobe cyst. Portal vein is patent on color Doppler imaging with normal direction of blood flow towards the liver. Other: None. IMPRESSION: Mild gallbladder wall edema/pericholecystic fluid, without cholelithiasis or findings to suggest acute cholecystitis. This appearance may be secondary/reactive to hepatic inflammation. Gallbladder adenomyomatosis, benign. Electronically Signed   By: Charline Bills M.D.   On: 03/31/2023 01:47   CT ABDOMEN PELVIS WO  CONTRAST  Result Date: 03/30/2023 CLINICAL DATA:  Abdominal pain, nausea/vomiting EXAM: CT ABDOMEN AND PELVIS WITHOUT CONTRAST TECHNIQUE: Multidetector CT imaging of the abdomen and pelvis was performed following the standard protocol without IV contrast. RADIATION DOSE REDUCTION: This exam was performed according to the departmental dose-optimization program which includes automated exposure control, adjustment of the mA and/or kV according to patient size and/or use of iterative reconstruction technique. COMPARISON:  01/13/2023 FINDINGS: Lower chest: Small right pleural effusion, chronic. Associated right lower lobe scarring/atelectasis. Hepatobiliary: 16 mm cyst in the left liver (series 3/image 14), benign. Suspected cholelithiasis (series 3/image 30) with additional layering gallbladder sludge versus tiny gallstones. Mild pericholecystic fluid/inflammatory changes (series 3/image 32). No intrahepatic or extrahepatic ductal dilatation. Pancreas: Within normal limits. Spleen: Within normal limits. Adrenals/Urinary Tract: 2.6 cm left adrenal nodule with macroscopic fat, favoring a benign myelolipoma, unchanged. Right adrenal gland is within normal limits. Kidneys are within normal limits. No renal calculi or hydronephrosis. Bladder is within normal limits. Stomach/Bowel: Stomach is within normal limits. No evidence of bowel obstruction. Normal appendix (series 3/image 44). Extensive sigmoid diverticulosis,  without evidence of diverticulitis. Vascular/Lymphatic: No evidence of abdominal aortic aneurysm. Atherosclerotic calcifications of the abdominal aorta and branch vessels. No suspicious abdominopelvic lymphadenopathy. Reproductive: Status post hysterectomy. No adnexal masses. Other: Trace abdominopelvic ascites. Musculoskeletal: Degenerative changes of the visualized thoracolumbar spine. IMPRESSION: Cholelithiasis with mild pericholecystic fluid/inflammatory changes, raising the possibility of acute  cholecystitis. If this is clinically discordant, consider right upper quadrant ultrasound for further evaluation as clinically warranted. Additional ancillary findings as above. Electronically Signed   By: Charline Bills M.D.   On: 03/30/2023 21:13      LOS: 1 day    Joycelyn Das, MD [facility] Available via Epic secure chat 7am-7pm After these hours, please refer to coverage provider listed on [email or web address] 04/01/2023, 2:10 PM

## 2023-04-01 NOTE — Progress Notes (Signed)
VASCULAR LAB    Bilateral lower extremity venous duplex has been performed.  See CV proc for preliminary results.   Azarya Oconnell, RVT 04/01/2023, 2:33 PM

## 2023-04-01 NOTE — Consult Note (Signed)
WOC Nurse Consult Note: Reason for Consult:Patient known to our team from her last admission. Right groin wound is followed by Vascular surgery. Last seen in office on 03/24/23. Abdominal wounds from erythema intertrigo.  ICD-10 CM Codes for Irritant Dermatitis L24A9 - Due to friction or contact with other specified body fluids L30.4  - Erythema intertrigo. Also used for abrasion of the hand, chafing of the skin, dermatitis due to sweating and friction, friction dermatitis, friction eczema, and genital/thigh intertrigo.  Wound type:surgical, moisture Pressure Injury POA: N/A Measurement:To be documented by Bedside RN at next dressing change.  Dressing procedure/placement/frequency:I have provided guidance for bedside Nursing for the right groin (inguinal) wound per instructions from [facility] during the patient's last encounter with that team and erythema intertrigo guidance per house protocol using our house antimicrobial moisture wicking textile, [employer] [place] # [phone]) Turning and repositioning per house protocol is in place with time in the supine position minimized. A sacral foam is to be placed for PI prevention. Heels are to be floated.  WOC nursing team will not follow, but will remain available to this patient, the nursing and medical teams.  Please re-consult if needed.  Thank you for inviting [place] to participate in this patient's Plan of Care.  Ladona Mow, MSN, RN, CNS, GNP, Leda Min, [employer], [employer] phone:  [phone]

## 2023-04-01 NOTE — Hospital Course (Signed)
Natasha Woodard is a 82 y.o. female with past medical history significant of CAD status post MI February 2024 with multiple complications including post MI cardiac arrest, requirement for intra-aortic balloon pump and eventual placement of AICD, diabetes mellitus type 2, peripheral vascular disease, sleep apnea and dyslipidemia, congestive heart failure presented to hospital with nausea vomiting for several days with weakness and diffuse abdominal pain.  In the ED patient was normotensive.  Labs showed creatinine of 3.0 from baseline 1.7-1.8.  CT scan of the abdomen pelvis and ultrasound did not show any acute findings..  Troponins 40s but have remained flat without chest pain. Her defibrillator has not discharged since onset of her symptoms.  EKG was unremarkable.  Patient was then considered for admission to the hospital for further evaluation and treatment.  Assessment and plan:   Intractable nausea and vomiting Secondary to viral gastroenteritis.  Improved.  Received IV fluids.  On clears.  Advance as tolerated.  Acute kidney injury on chronic kidney disease stage III Improved with hydration.  Creatinine of 2.7.  Slightly improved from initial 3.0.  Mildly elevated troponin Likely demand ischemia.  No ischemic symptoms and EKG within normal range.  History of HFrEF/AICD ARB and diuretics on hold since patient was initially volume depleted.  History of ventricular arrest status post AICD.    History of CAD/prior MI 2024-history of concomitant cardiogenic shock Continue Plavix.   History of VT arrest in February 2024/known PAF Continue amiodarone and Eliquis   Mild swelling right lower extremity Lower extremity DVT was ordered pending.

## 2023-04-02 DIAGNOSIS — N183 Chronic kidney disease, stage 3 unspecified: Secondary | ICD-10-CM | POA: Diagnosis not present

## 2023-04-02 DIAGNOSIS — I10 Essential (primary) hypertension: Secondary | ICD-10-CM | POA: Diagnosis not present

## 2023-04-02 DIAGNOSIS — I420 Dilated cardiomyopathy: Secondary | ICD-10-CM | POA: Diagnosis not present

## 2023-04-02 DIAGNOSIS — N179 Acute kidney failure, unspecified: Secondary | ICD-10-CM | POA: Diagnosis not present

## 2023-04-02 LAB — CBC
HCT: 36 % (ref 36.0–46.0)
Hemoglobin: 11.7 g/dL — ABNORMAL LOW (ref 12.0–15.0)
MCH: 30.3 pg (ref 26.0–34.0)
MCHC: 32.5 g/dL (ref 30.0–36.0)
MCV: 93.3 fL (ref 80.0–100.0)
Platelets: 228 10*3/uL (ref 150–400)
RBC: 3.86 MIL/uL — ABNORMAL LOW (ref 3.87–5.11)
RDW: 15.2 % (ref 11.5–15.5)
WBC: 4.5 10*3/uL (ref 4.0–10.5)
nRBC: 0 % (ref 0.0–0.2)

## 2023-04-02 LAB — GASTROINTESTINAL PANEL BY PCR, STOOL (REPLACES STOOL CULTURE)

## 2023-04-02 LAB — BASIC METABOLIC PANEL
Anion gap: 11 (ref 5–15)
BUN: 39 mg/dL — ABNORMAL HIGH (ref 8–23)
CO2: 25 mmol/L (ref 22–32)
Calcium: 9.2 mg/dL (ref 8.9–10.3)
Chloride: 102 mmol/L (ref 98–111)
Creatinine, Ser: 2.85 mg/dL — ABNORMAL HIGH (ref 0.44–1.00)
GFR, Estimated: 16 mL/min — ABNORMAL LOW (ref >60)
Glucose, Bld: 135 mg/dL — ABNORMAL HIGH (ref 70–99)
Potassium: 4.6 mmol/L (ref 3.5–5.1)
Sodium: 138 mmol/L (ref 135–145)

## 2023-04-02 LAB — GLUCOSE, CAPILLARY
Glucose-Capillary: 153 mg/dL — ABNORMAL HIGH (ref 70–99)
Glucose-Capillary: 143 mg/dL — ABNORMAL HIGH (ref 70–99)
Glucose-Capillary: 123 mg/dL — ABNORMAL HIGH (ref 70–99)
Glucose-Capillary: 162 mg/dL — ABNORMAL HIGH (ref 70–99)

## 2023-04-02 LAB — MAGNESIUM: Magnesium: 1.9 mg/dL (ref 1.7–2.4)

## 2023-04-02 MED ORDER — ALUM & MAG HYDROXIDE-SIMETH 200-200-20 MG/5ML PO SUSP
30.0000 mL | Freq: Four times a day (QID) | ORAL | Status: DC | PRN
  Administered 2023-04-02: 30 mL via ORAL
  Filled 2023-04-02: qty 30

## 2023-04-02 NOTE — Plan of Care (Signed)
  Problem: Fluid Volume: Goal: Ability to maintain a balanced intake and output will improve Outcome: Progressing   Problem: Skin Integrity: Goal: Risk for impaired skin integrity will decrease Outcome: Progressing   Problem: Tissue Perfusion: Goal: Adequacy of tissue perfusion will improve Outcome: Progressing   

## 2023-04-02 NOTE — Progress Notes (Signed)
PROGRESS NOTE    Natasha Woodard  ZOX:[id number] DOB: 01-17-1941 DOA: 03/30/2023 PCP: [name], MD    Brief Narrative:  Natasha Woodard is a 82 y.o. female with past medical history significant of CAD status post MI February 2024 with multiple complications including post MI cardiac arrest, requirement for intra-aortic balloon pump and eventual placement of AICD, diabetes mellitus type 2, peripheral vascular disease, sleep apnea and dyslipidemia, congestive heart failure presented to hospital with nausea vomiting for several days with weakness and diffuse abdominal pain.  In the ED, patient was normotensive.  Labs showed creatinine of 3.0 from baseline 1.7-1.8.  CT scan of the abdomen pelvis and ultrasound did not show any acute findings..  Troponins 40s but have remained flat without chest pain. Her defibrillator has not discharged since onset of her symptoms.  EKG was unremarkable.  Patient was then considered for admission to the hospital for further evaluation and treatment.  Assessment and plan:   Intractable nausea and vomiting Has improved at this time.  Secondary to viral gastroenteritis. Received IV fluids.  On clears.  NPO.  Acute kidney injury on chronic kidney disease stage III   Creatinine of 2.8.  Slightly improved from initial 3.0.  Will continue to monitor.  Check BMP in AM.  Will monitor closely in the context of advanced heart failure.  Mildly elevated troponin Likely demand ischemia.  No ischemic symptoms and EKG within normal range.  Patient does follow up with cardiology as outpatient.  History of HFrEF/AICD ARB and diuretics on hold since patient was initially volume depleted.  History of ventricular arrest status post AICD.     History of CAD/prior MI 2024-history of concomitant cardiogenic shock Continue Plavix.   History of VT arrest in February 2024/known PAF Continue amiodarone and Eliquis   Mild swelling right lower extremity No evidence of DVT  bilaterally.      DVT prophylaxis: apixaban (ELIQUIS) tablet 2.5 mg Start: 03/31/23 1400 apixaban (ELIQUIS) tablet 2.5 mg   Code Status:     Code Status: Full Code  Disposition: Home likely 04/03/2023 if renal function continues to improve, remains stable  Status is: Inpatient  Remains inpatient appropriate because: Nausea vomiting, AKI,   Family Communication:  Spoke with the patient's family at bedside  Consultants:  None yet  Procedures:  None  Antimicrobials:  None  Anti-infectives (From admission, onward)    None      Subjective: Today, patient was seen and examined at bedside.  Denies any nausea vomiting fever chills or rigor.  Wants to go home.  Family at bedside. Objective: Vitals:   04/01/23 1627 04/01/23 2017 04/02/23 0438 04/02/23 0934  BP: 114/80 119/81 109/75 111/71  Pulse: 71 71 70 72  Resp: (!) 21  Temp: 98.2 F (36.8 C) 98.1 F (36.7 C) (!) 97.3 F (36.3 C) 98 F (36.7 C)  TempSrc:  Oral Oral   SpO2: 98% 98% 94% 95%  Weight:      Height:        Intake/Output Summary (Last 24 hours) at 04/02/2023 1434 Last data filed at 04/02/2023 0800 Gross per 24 hour  Intake 780 ml  Output 0 ml  Net 780 ml    Filed Weights   03/30/23 1838 03/31/23 1524  Weight: 65.8 kg 70.5 kg    Physical Examination: Body mass index is 28.43 kg/m.   General:  Average built, not in obvious distress, elderly female, alert awake and Communicative, hard of hearing, HENT:  No scleral pallor or icterus noted. Oral mucosa is moist.  Chest:  Diminished breath sounds bilaterally.  CVS: S1 &S2 heard. No murmur.  Regular rate and rhythm. Abdomen: Soft, nontender, nondistended.  Bowel sounds are heard.   Extremities: No cyanosis, clubbing or edema.  Peripheral pulses are palpable. Psych: Alert, awake and Communicative. CNS:  No cranial nerve deficits.  Power equal in all extremities.   Skin: Warm and dry.  No rashes noted.  Data Reviewed:   CBC: Recent  Labs  Lab 03/28/23 0955 03/30/23 1845 04/01/23 0359 04/02/23 0154  WBC 4.7 4.6 4.8 4.5  NEUTROABS 3.0 2.6  --   --   HGB 12.6 11.6* 11.9* 11.7*  HCT 39.2 37.3 37.9 36.0  MCV 94.6 96.1 95.2 93.3  PLT 272.0 242 246 228     Basic Metabolic Panel: Recent Labs  Lab 03/28/23 0955 03/30/23 1845 03/31/23 0706 04/01/23 0359 04/02/23 0154  NA 139 133* 135 134* 138  K 4.0 3.9 4.1 4.2 4.6  CL 99 97* 100 101 102  CO2 27 21* 21* 22 25  GLUCOSE 132* 195* 114* 111* 135*  BUN 37* 42* 43* 41* 39*  CREATININE 2.18* 3.06* 2.96* 2.76* 2.85*  CALCIUM 9.7 9.1 9.2 8.9 9.2  MG  --   --   --   --  1.9     Liver Function Tests: Recent Labs  Lab 03/28/23 0955 03/30/23 1845 04/01/23 0359  AST 23 54* 41  ALT 28 64* 61*  ALKPHOS 131* 212* 204*  BILITOT 0.6 0.5 0.7  PROT 6.8 6.3* 6.3*  ALBUMIN 4.0 3.3* 3.2*      Radiology Studies: VAS [place] LOWER EXTREMITY VENOUS (DVT)  Result Date: 04/01/2023  Lower Venous DVT Study Patient Name:  Natasha Woodard  Date of Exam:   04/01/2023 Medical Rec #: [id number]          Accession #:    [id number] Date of Birth: 05/30/1941          Patient Gender: F Patient Age:   67 years Exam Location:  [facility] Procedure:      VAS [place] LOWER EXTREMITY VENOUS (DVT) Referring Phys: Junious Silk --------------------------------------------------------------------------------  Indications: Edema.  Limitations: Wound vac right groin. Comparison Study: No prior study on file Performing Technologist: Sherren Kerns RVS  Examination Guidelines: A complete evaluation includes B-mode imaging, spectral Doppler, color Doppler, and power Doppler as needed of all accessible portions of each vessel. Bilateral testing is considered an integral part of a complete examination. Limited examinations for reoccurring indications may be performed as noted. The reflux portion of the exam is performed with the patient in reverse Trendelenburg.   +---------+---------------+---------+-----------+----------+-------------------+ RIGHT    CompressibilityPhasicitySpontaneityPropertiesThrombus Aging      +---------+---------------+---------+-----------+----------+-------------------+ CFV                                                   Not well visualized +---------+---------------+---------+-----------+----------+-------------------+ SFJ                                                   Not well visualized +---------+---------------+---------+-----------+----------+-------------------+ FV Prox  Full           No  No                                       +---------+---------------+---------+-----------+----------+-------------------+ FV Mid   Full           Yes      Yes                                      +---------+---------------+---------+-----------+----------+-------------------+ FV DistalFull                                                             +---------+---------------+---------+-----------+----------+-------------------+ PFV      Full                                                             +---------+---------------+---------+-----------+----------+-------------------+ POP      Full           Yes      Yes                                      +---------+---------------+---------+-----------+----------+-------------------+ PTV      Full                                                             +---------+---------------+---------+-----------+----------+-------------------+ PERO     Full                                                             +---------+---------------+---------+-----------+----------+-------------------+   +---------+---------------+---------+-----------+----------+--------------+ LEFT     CompressibilityPhasicitySpontaneityPropertiesThrombus Aging +---------+---------------+---------+-----------+----------+--------------+ CFV      Full            Yes      Yes                                 +---------+---------------+---------+-----------+----------+--------------+ SFJ      Full                                                        +---------+---------------+---------+-----------+----------+--------------+ FV Prox  Full                                                        +---------+---------------+---------+-----------+----------+--------------+  FV Mid   Full           Yes      Yes                                 +---------+---------------+---------+-----------+----------+--------------+ FV DistalFull                                                        +---------+---------------+---------+-----------+----------+--------------+ PFV      Full                                                        +---------+---------------+---------+-----------+----------+--------------+ POP      Full           No       Yes                                 +---------+---------------+---------+-----------+----------+--------------+ PTV      Full                                                        +---------+---------------+---------+-----------+----------+--------------+ PERO     Full                                                        +---------+---------------+---------+-----------+----------+--------------+     Summary: RIGHT: - There is no evidence of deep vein thrombosis in the lower extremity. However, portions of this examination were limited- see technologist comments above.  - No cystic structure found in the popliteal fossa.  LEFT: - There is no evidence of deep vein thrombosis in the lower extremity.  - No cystic structure found in the popliteal fossa.  *See table(s) above for measurements and observations. Electronically signed by Sherald Hess MD on 04/01/2023 at 5:03:56 PM.    Final       LOS: 2 days    Joycelyn Das, MD [facility] Available via Epic secure chat  7am-7pm After these hours, please refer to coverage provider listed on [email or web address] 04/02/2023, 2:34 PM

## 2023-04-03 ENCOUNTER — Telehealth: Payer: Self-pay

## 2023-04-03 DIAGNOSIS — N179 Acute kidney failure, unspecified: Secondary | ICD-10-CM | POA: Diagnosis not present

## 2023-04-03 DIAGNOSIS — N183 Chronic kidney disease, stage 3 unspecified: Secondary | ICD-10-CM | POA: Diagnosis not present

## 2023-04-03 DIAGNOSIS — I10 Essential (primary) hypertension: Secondary | ICD-10-CM | POA: Diagnosis not present

## 2023-04-03 DIAGNOSIS — I420 Dilated cardiomyopathy: Secondary | ICD-10-CM | POA: Diagnosis not present

## 2023-04-03 LAB — CBC
HCT: 38.5 % (ref 36.0–46.0)
Hemoglobin: 11.7 g/dL — ABNORMAL LOW (ref 12.0–15.0)
MCH: 29.3 pg (ref 26.0–34.0)
MCHC: 30.4 g/dL (ref 30.0–36.0)
MCV: 96.5 fL (ref 80.0–100.0)
Platelets: 234 10*3/uL (ref 150–400)
RBC: 3.99 MIL/uL (ref 3.87–5.11)
RDW: 15.2 % (ref 11.5–15.5)
WBC: 5.5 10*3/uL (ref 4.0–10.5)
nRBC: 0 % (ref 0.0–0.2)

## 2023-04-03 LAB — GLUCOSE, CAPILLARY: Glucose-Capillary: 89 mg/dL (ref 70–99)

## 2023-04-03 LAB — BASIC METABOLIC PANEL
Anion gap: 9 (ref 5–15)
BUN: 39 mg/dL — ABNORMAL HIGH (ref 8–23)
CO2: 24 mmol/L (ref 22–32)
Calcium: 9.1 mg/dL (ref 8.9–10.3)
Chloride: 106 mmol/L (ref 98–111)
Creatinine, Ser: 2.48 mg/dL — ABNORMAL HIGH (ref 0.44–1.00)
GFR, Estimated: 19 mL/min — ABNORMAL LOW (ref >60)
Glucose, Bld: 105 mg/dL — ABNORMAL HIGH (ref 70–99)
Potassium: 4.8 mmol/L (ref 3.5–5.1)
Sodium: 139 mmol/L (ref 135–145)

## 2023-04-03 LAB — URINE CULTURE

## 2023-04-03 MED ORDER — CLOPIDOGREL BISULFATE 75 MG PO TABS: 75.0000 mg | ORAL_TABLET | Freq: Every day | ORAL | 1 refills | Status: DC

## 2023-04-03 MED ORDER — APIXABAN 2.5 MG PO TABS: 2.5000 mg | ORAL_TABLET | Freq: Two times a day (BID) | ORAL | 1 refills | Status: DC

## 2023-04-03 MED ORDER — AMIODARONE HCL 200 MG PO TABS: 200.0000 mg | ORAL_TABLET | Freq: Every day | ORAL | 2 refills | Status: DC

## 2023-04-03 MED ORDER — TORSEMIDE 20 MG PO TABS: 20.0000 mg | ORAL_TABLET | Freq: Every day | ORAL | 0 refills | Status: DC

## 2023-04-03 NOTE — TOC Transition Note (Signed)
Transition of Care [facility]) - CM/SW Discharge Note   Patient Details  Name: Natasha Woodard MRN: [id number] Date of Birth: February 02, 1941  [facility] CM/SW Contact:  Tom-Johnson, Hershal Coria, RN Phone Number: 04/03/2023, 10:12 AM   Clinical Narrative:     Patient is scheduled for discharge today.  Readmission Prevention Assessment done.  Hospital f/u and discharge instructions on AVS.  No TOC needs or recommendations noted. Son, Gerlene Burdock to transport at discharge. No further TOC needs noted.          Final next level of care: Home/Self Care Barriers to Discharge: Barriers Resolved   Patient Goals and CMS Choice CMS Medicare.gov Compare Post Acute Care list provided to:: Patient Choice offered to / list presented to : NA  Discharge Placement                  Patient to be transferred to facility by: Son Name of family member notified: Kayona Foor.    Discharge Plan and Services Additional resources added to the After Visit Summary for                  DME Arranged: N/A DME Agency: NA       HH Arranged: NA HH Agency: NA        Social Determinants of Health (SDOH) Interventions SDOH Screenings   Food Insecurity: No Food Insecurity (03/31/2023)  Housing: Low Risk  (03/31/2023)  Transportation Needs: No Transportation Needs (03/31/2023)  Utilities: Not At Risk (03/31/2023)  Depression (PHQ2-9): Medium Risk (03/28/2023)  Financial Resource Strain: Low Risk  (03/27/2023)  Physical Activity: Sufficiently Active (03/27/2023)  Social Connections: Unknown (03/27/2023)  Stress: No Stress Concern Present (03/27/2023)  Tobacco Use: Medium Risk (03/30/2023)     Readmission Risk Interventions    04/03/2023   10:10 AM  Readmission Risk Prevention Plan  Transportation Screening Complete  PCP or Specialist Appt within 5-7 Days Complete  Home Care Screening Complete  Medication Review (RN CM) Referral to Pharmacy

## 2023-04-03 NOTE — Progress Notes (Signed)
Explained discharge instructions to patient. Reviewed follow up appointment and next medication administration times. Also reviewed education. Patient verbalized having an understanding for instructions given. All belongings are in the patient's possession. IV and telemetry were removed. CCMD was notified. No other needs verbalized. Transported downstairs for discharge. 

## 2023-04-03 NOTE — Progress Notes (Signed)
Care Management & Coordination Services Pharmacy Team  Reason for Encounter: Appointment Reminder  Contacted patient to confirm telephone appointment with Al Corpus, PharmD on 04/05/2023 at 9:00.  Unsuccessful outreach. Left voicemail for patient to return call.  Star Rating Drugs:  Medication:  Last Fill: Day Supply Rosuvastatin 10 mg 03/23/2023 90  Care Gaps: Annual wellness visit in last year? Yes 05/12/2022  If Diabetic: Last eye exam / retinopathy screening: Up to date Last diabetic foot exam: Up to date  Al Corpus, PharmD notified  Claudina Lick, [place] Clinical Pharmacy Assistant [phone]

## 2023-04-03 NOTE — Consult Note (Signed)
   [facility] Inpatient Consult   04/03/2023  Natasha Woodard Jun 24, 1941 [id number]  [facility] [[facility]]  Accountable Care Organization [ACO] Patient: [name] Medicare  Primary Care Provider:  [name], MD with [place] [facility] whisch is listed to provide the transition of care follow up   Patient is currently active with [facility] [[facility]] Care Management for chronic disease management services.  Patient has been engaged by a [facility] RN CC and noted with [name], television/film/video.  Our community based plan of care has focused on disease management and community resource support.    Patient will receive a post hospital call and will be evaluated for assessments and disease process education.    Plan:  Will make [facility] RN CC aware of patient's admission  No new needs assessed for post hospital follow up.   Of note, [facility] Care Management services does not replace or interfere with any services that are needed or arranged by inpatient [facility] care management team.   For additional questions or referrals please contact:  Charlesetta Shanks, RN BSN CCM [facility]  [phone] business mobile phone Toll free office [phone]  *Concierge Line  [phone] Fax number: [phone] [email or web address]

## 2023-04-03 NOTE — Plan of Care (Signed)

## 2023-04-03 NOTE — Discharge Summary (Signed)
Physician Discharge Summary  Natasha Woodard ZOX:[id number] DOB: 08/19/1941 DOA: 03/30/2023  PCP: [name], MD  Admit date: 03/30/2023 Discharge date: 04/03/2023  Admitted From: Home  Discharge disposition: Home   Recommendations for Outpatient Follow-Up:   Follow up with your primary care provider in one week.  Check CBC, BMP, magnesium in the next visit Follow-up with cardiology as scheduled by the clinic.  Discharge Diagnosis:   Principal Problem:   Acute kidney injury Active Problems:   Dilated cardiomyopathy   Type II diabetes mellitus with peripheral artery disease   Hyperlipidemia associated with type 2 diabetes mellitus   Essential hypertension   PAF (paroxysmal atrial fibrillation)   Chronic kidney disease (CKD), stage III (moderate)   Nausea and vomiting   Discharge Condition: Improved.  Diet recommendation: Low sodium, heart healthy.  Carbohydrate-modified.    Wound care: None.  Code status: Full.   History of Present Illness:   Natasha Woodard is a 82 y.o. female with past medical history significant of CAD status post MI February 2024 with multiple complications including post MI cardiac arrest, requirement for intra-aortic balloon pump and eventual placement of AICD, diabetes mellitus type 2, peripheral vascular disease, sleep apnea and dyslipidemia, congestive heart failure presented to hospital with nausea vomiting for several days with weakness and diffuse abdominal pain.  In the ED, patient was normotensive.  Labs showed creatinine of 3.0 from baseline 1.7-1.8.  CT scan of the abdomen pelvis and ultrasound did not show any acute findings..  Troponins 40s but have remained flat without chest pain. Her defibrillator has not discharged since onset of her symptoms.  EKG was unremarkable.  Patient was then considered for admission to the hospital for further evaluation and treatment.    Hospital Course:   Following conditions were addressed  during hospitalization as listed below,  Intractable nausea and vomiting, acute gastroenteritis secondary to enterotoxigenic E. coli Has improved at this time.  Secondary to viral gastroenteritis. Received IV fluids.  Has improved at this time.  Diet being advanced and patient has tolerated well.  No further diarrhea.  No indication for antibiotic.  Acute kidney injury on chronic kidney disease stage III Volume depletion from GI loss.  Improving.  Creatinine of 2.4.  improved from initial 3.0.  Will continue to monitor.  Check BMP in AM.  Will hold her torsemide for next 2 days before reinitiating.   Mildly elevated troponin Likely demand ischemia.  No ischemic symptoms and EKG within normal range.  Patient does follow up with cardiology as outpatient.   History of HFrEF/AICD ARB and diuretics on hold since patient was initially volume depleted.  History of ventricular arrest status post AICD.     History of CAD/prior MI 2024-history of concomitant cardiogenic shock Continue Plavix.   History of VT arrest in February 2024/known PAF Continue amiodarone and Eliquis   Mild swelling right lower extremity No evidence of DVT bilaterally.     Disposition.  At this time, patient is stable for disposition home with outpatient PCP and cardiology follow-up. Spoke with the patient's family at bedside. Medical Consultants:   None.  Procedures:    None Subjective:   Today, patient was seen and examined at bedside.  Denies any nausea, vomiting fever chills or rigor.  Feels better.  No further diarrhea.  Discharge Exam:   Vitals:   04/02/23 2029 04/03/23 0545  BP: 104/69 127/60  Pulse: 70 70  Resp: 18 18  Temp: 97.9 F (36.6 C) 97.6  F (36.4 C)  SpO2: 97% 92%   Vitals:   04/02/23 0934 04/02/23 1709 04/02/23 2029 04/03/23 0545  BP: 111/71 121/79 104/69 127/60  Pulse: 72 70 70 70  Resp: (!) 21 18 18 18   Temp: 98 F (36.7 C) 98 F (36.7 C) 97.9 F (36.6 C) 97.6 F (36.4 C)   TempSrc:  Oral Oral Oral  SpO2: 95% 99% 97% 92%  Weight:      Height:       Body mass index is 28.43 kg/m.   General: Alert awake, not in obvious distress HENT: pupils equally reacting to light,  No scleral pallor or icterus noted. Oral mucosa is moist.  Chest:   Diminished breath sounds bilaterally. No crackles or wheezes.  Left chest wall defibrillator in place. CVS: S1 &S2 heard. No murmur.  Regular rate and rhythm. Abdomen: Soft, nontender, nondistended.  Bowel sounds are heard.   Extremities: No cyanosis, clubbing or edema.  Peripheral pulses are palpable. Psych: Alert, awake and oriented, normal mood CNS:  No cranial nerve deficits.  Power equal in all extremities.   Skin: Warm and dry.  No rashes noted.  The results of significant diagnostics from this hospitalization (including imaging, microbiology, ancillary and laboratory) are listed below for reference.     Diagnostic Studies:   US Abdomen Limited RUQ (LIVER/GB)  Result Date: 03/31/2023 CLINICAL DATA:  Right upper quadrant pain EXAM: ULTRASOUND ABDOMEN LIMITED RIGHT UPPER QUADRANT COMPARISON:  CT abdomen/pelvis dated 03/30/2023 FINDINGS: Gallbladder: Echogenic focus at the gallbladder fundus, nonshadowing with corresponding to the CT finding, reflecting gallbladder adenomyomatosis rather than cholelithiasis. No cholelithiasis or significant gallbladder distension. Mild gallbladder wall edema/pericholecystic fluid. Negative sonographic Murphy's sign. Common bile duct: Diameter: 3 mm Liver: At the upper limits of normal for parenchymal echogenicity. 1.7 x 1.3 x 1.9 cm simple left hepatic lobe cyst. Portal vein is patent on color Doppler imaging with normal direction of blood flow towards the liver. Other: None. IMPRESSION: Mild gallbladder wall edema/pericholecystic fluid, without cholelithiasis or findings to suggest acute cholecystitis. This appearance may be secondary/reactive to hepatic inflammation. Gallbladder  adenomyomatosis, benign. Electronically Signed   By: Charline Bills M.D.   On: 03/31/2023 01:47   CT ABDOMEN PELVIS WO CONTRAST  Result Date: 03/30/2023 CLINICAL DATA:  Abdominal pain, nausea/vomiting EXAM: CT ABDOMEN AND PELVIS WITHOUT CONTRAST TECHNIQUE: Multidetector CT imaging of the abdomen and pelvis was performed following the standard protocol without IV contrast. RADIATION DOSE REDUCTION: This exam was performed according to the departmental dose-optimization program which includes automated exposure control, adjustment of the mA and/or kV according to patient size and/or use of iterative reconstruction technique. COMPARISON:  01/13/2023 FINDINGS: Lower chest: Small right pleural effusion, chronic. Associated right lower lobe scarring/atelectasis. Hepatobiliary: 16 mm cyst in the left liver (series 3/image 14), benign. Suspected cholelithiasis (series 3/image 30) with additional layering gallbladder sludge versus tiny gallstones. Mild pericholecystic fluid/inflammatory changes (series 3/image 32). No intrahepatic or extrahepatic ductal dilatation. Pancreas: Within normal limits. Spleen: Within normal limits. Adrenals/Urinary Tract: 2.6 cm left adrenal nodule with macroscopic fat, favoring a benign myelolipoma, unchanged. Right adrenal gland is within normal limits. Kidneys are within normal limits. No renal calculi or hydronephrosis. Bladder is within normal limits. Stomach/Bowel: Stomach is within normal limits. No evidence of bowel obstruction. Normal appendix (series 3/image 44). Extensive sigmoid diverticulosis, without evidence of diverticulitis. Vascular/Lymphatic: No evidence of abdominal aortic aneurysm. Atherosclerotic calcifications of the abdominal aorta and branch vessels. No suspicious abdominopelvic lymphadenopathy. Reproductive: Status post hysterectomy. No adnexal  masses. Other: Trace abdominopelvic ascites. Musculoskeletal: Degenerative changes of the visualized thoracolumbar spine.  IMPRESSION: Cholelithiasis with mild pericholecystic fluid/inflammatory changes, raising the possibility of acute cholecystitis. If this is clinically discordant, consider right upper quadrant ultrasound for further evaluation as clinically warranted. Additional ancillary findings as above. Electronically Signed   By: Charline Bills M.D.   On: 03/30/2023 21:13     Labs:   Basic Metabolic Panel: Recent Labs  Lab 03/30/23 1845 03/31/23 0706 04/01/23 0359 04/02/23 0154 04/03/23 0241  NA 133* 135 134* 138 139  K 3.9 4.1 4.2 4.6 4.8  CL 97* 100 101 102 106  CO2 21* 21* GLUCOSE 195* 114* 111* 135* 105*  BUN 42* 43* 41* 39* 39*  CREATININE 3.06* 2.96* 2.76* 2.85* 2.48*  CALCIUM 9.1 9.2 8.9 9.2 9.1  MG  --   --   --  1.9  --    GFR Estimated Creatinine Clearance: 16.1 mL/min (A) (by C-G formula based on SCr of 2.48 mg/dL (H)). Liver Function Tests: Recent Labs  Lab 03/28/23 0955 03/30/23 1845 04/01/23 0359  AST 23 54* 41  ALT 28 64* 61*  ALKPHOS 131* 212* 204*  BILITOT 0.6 0.5 0.7  PROT 6.8 6.3* 6.3*  ALBUMIN 4.0 3.3* 3.2*   Recent Labs  Lab 03/28/23 0955 03/30/23 1845  LIPASE 14.0 33   No results for input(s): "AMMONIA" in the last 168 hours. Coagulation profile No results for input(s): "INR", "PROTIME" in the last 168 hours.  CBC: Recent Labs  Lab 03/28/23 0955 03/30/23 1845 04/01/23 0359 04/02/23 0154 04/03/23 0241  WBC 4.7 4.6 4.8 4.5 5.5  NEUTROABS 3.0 2.6  --   --   --   HGB 12.6 11.6* 11.9* 11.7* 11.7*  HCT 39.2 37.3 37.9 36.0 38.5  MCV 94.6 96.1 95.2 93.3 96.5  PLT 272.0 242 246 228 234   Cardiac Enzymes: No results for input(s): "CKTOTAL", "CKMB", "CKMBINDEX", "TROPONINI" in the last 168 hours. BNP: Invalid input(s): "POCBNP" CBG: Recent Labs  Lab 04/02/23 0740 04/02/23 1141 04/02/23 1602 04/02/23 2030 04/03/23 0723  GLUCAP 153* 143* 123* 162* 89   D-Dimer No results for input(s): "DDIMER" in the last 72 hours. Hgb A1c No  results for input(s): "HGBA1C" in the last 72 hours.  Lipid Profile No results for input(s): "CHOL", "HDL", "LDLCALC", "TRIG", "CHOLHDL", "LDLDIRECT" in the last 72 hours. Thyroid function studies No results for input(s): "TSH", "T4TOTAL", "T3FREE", "THYROIDAB" in the last 72 hours.  Invalid input(s): "FREET3" Anemia work up No results for input(s): "VITAMINB12", "FOLATE", "FERRITIN", "TIBC", "IRON", "RETICCTPCT" in the last 72 hours. Microbiology Recent Results (from the past 240 hour(s))  Urine Culture     Status: None   Collection Time: 03/24/23  3:56 PM   Specimen: Blood  Result Value Ref Range Status   MICRO NUMBER: [id number]  Final   SPECIMEN QUALITY: Adequate  Final   Sample Source URINE  Final   STATUS: FINAL  Final   Result:   Final    Mixed genital flora isolated. These superficial bacteria are not indicative of a urinary tract infection. No further organism identification is warranted on this specimen. If clinically indicated, recollect clean-catch, mid-stream urine and transfer  immediately to Urine Culture Transport Tube.   MICROSCOPIC MESSAGE     Status: None   Collection Time: 03/24/23  3:56 PM  Result Value Ref Range Status   Note   Final    Comment: This urine was analyzed for the presence of  WBC,  RBC, bacteria, casts, and other formed elements.  Only those elements seen were reported. . .   Gastrointestinal Panel by PCR , Stool     Status: Abnormal   Collection Time: 03/31/23  7:56 AM   Specimen: Stool  Result Value Ref Range Status   Campylobacter species NOT DETECTED NOT DETECTED Final   Plesimonas shigelloides NOT DETECTED NOT DETECTED Final   Salmonella species NOT DETECTED NOT DETECTED Final   Yersinia enterocolitica NOT DETECTED NOT DETECTED Final   Vibrio species NOT DETECTED NOT DETECTED Final   Vibrio cholerae NOT DETECTED NOT DETECTED Final   Enteroaggregative E coli (EAEC) NOT DETECTED NOT DETECTED Final   Enteropathogenic E coli (EPEC) NOT  DETECTED NOT DETECTED Final   Enterotoxigenic E coli (ETEC) DETECTED (A) NOT DETECTED Final    Comment: CRITICAL RESULT CALLED TO, READ BACK BY AND VERIFIED WITH: [facility] 04/02/2023 AT 0044 SRR    Shiga like toxin producing E coli (STEC) NOT DETECTED NOT DETECTED Final   Shigella/Enteroinvasive E coli (EIEC) NOT DETECTED NOT DETECTED Final   Cryptosporidium NOT DETECTED NOT DETECTED Final   Cyclospora cayetanensis NOT DETECTED NOT DETECTED Final   Entamoeba histolytica NOT DETECTED NOT DETECTED Final   Giardia lamblia NOT DETECTED NOT DETECTED Final   Adenovirus F40/41 NOT DETECTED NOT DETECTED Final   Astrovirus NOT DETECTED NOT DETECTED Final   Norovirus GI/GII NOT DETECTED NOT DETECTED Final   Rotavirus A NOT DETECTED NOT DETECTED Final   Sapovirus (I, II, IV, and V) NOT DETECTED NOT DETECTED Final    Comment: Performed at [facility], [address] [place], [place] [address]  Urine Culture     Status: None (Preliminary result)   Collection Time: 03/31/23  7:06 PM   Specimen: Urine, Random  Result Value Ref Range Status   Specimen Description URINE, RANDOM  Final   Special Requests   Final    URINE, CATHETERIZED Performed at [facility], [address] [place], [place] [address]    Culture PENDING  Incomplete   Report Status PENDING  Incomplete     Discharge Instructions:   Discharge Instructions     (HEART FAILURE PATIENTS) Call MD:  Anytime you have any of the following symptoms: 1) 3 pound weight gain in 24 hours or 5 pounds in 1 week 2) shortness of breath, with or without a dry hacking cough 3) swelling in the hands, feet or stomach 4) if you have to sleep on extra pillows at night in order to breathe.   Complete by: As directed    Call MD for:  persistant nausea and vomiting   Complete by: As directed    Call MD for:  severe uncontrolled pain   Complete by: As directed    Call MD for:  temperature >100.4   Complete by: As directed    Diet -  low sodium heart healthy   Complete by: As directed    Discharge instructions   Complete by: As directed    Follow-up with your primary care provider in 1 week.  Follow-up with cardiology in 1 to 2 weeks.  Start taking your water pill from 04/05/2023.  Seek medical attention for worsening symptoms.  Check blood work in the next visit.   Discharge wound care:   Complete by: As directed    Wound care to right groin wound: cleanse with soap and water once daily, pat dry. Cover with dry dressing and secure. Replace PRN for dressing  dislodgement.   Increase activity slowly   Complete by: As directed       Allergies as of 04/03/2023       Reactions   Ezetimibe-simvastatin Other (See Comments)   REACTION: Muscle aches (side effect)   Lipitor [atorvastatin] Other (See Comments)   Leg weakness    Claritin [loratadine]    Possible cause of nightmares.     Spironolactone    Caution re: elevated potassium/creatinine   Tamiflu [oseltamivir Phosphate] Other (See Comments)   nightmares   Pravastatin Sodium Other (See Comments)   REACTION: Muscle aches (side effect)   Sulfonamide Derivatives Nausea And Vomiting        Medication List     TAKE these medications    Accu-Chek Aviva Plus test strip Generic drug: glucose blood USE TO TEST BLOOD SUGAR ONCE DAILY   acetaminophen 325 MG tablet Commonly known as: Tylenol Take 1-2 tablets (325-650 mg total) by mouth every 6 (six) hours as needed.   amiodarone 200 MG tablet Commonly known as: PACERONE Take 1 tablet (200 mg total) by mouth daily.   apixaban 2.5 MG Tabs tablet Commonly known as: ELIQUIS Take 1 tablet (2.5 mg total) by mouth 2 (two) times daily.   clopidogrel 75 MG tablet Commonly known as: PLAVIX Take 1 tablet (75 mg total) by mouth daily.   melatonin 5 MG Tabs Take 5 mg by mouth at bedtime as needed.   multivitamin with minerals Tabs tablet Take 1 tablet by mouth daily.   ondansetron 4 MG tablet Commonly known as:  Zofran Take 1 tablet (4 mg total) by mouth every 8 (eight) hours as needed for nausea or vomiting.   rosuvastatin 10 MG tablet Commonly known as: CRESTOR Take 1 tablet (10 mg total) by mouth daily. TAKE 1 TABLET(10 MG) BY MOUTH DAILY Strength: 10 mg   torsemide 20 MG tablet Commonly known as: DEMADEX Take 1 tablet (20 mg total) by mouth daily. Start taking on: April 05, 2023 What changed: These instructions start on April 05, 2023. If you are unsure what to do until then, ask your doctor or other care provider.   traMADol 50 MG tablet Commonly known as: ULTRAM Take 1 tablet (50 mg total) by mouth 2 (two) times daily.               Discharge Care Instructions  (From admission, onward)           Start     Ordered   04/03/23 0000  Discharge wound care:       Comments: Wound care to right groin wound: cleanse with soap and water once daily, pat dry. Cover with dry dressing and secure. Replace PRN for dressing dislodgement.   04/03/23 1610            Follow-up Information     [name], MD Follow up.   Specialty: Family Medicine Contact information: [address] [place] [address] [phone]                  Time coordinating discharge: 39 minutes  Signed:  Vernon Ariel  [facility] 04/03/2023, 2:34 PM

## 2023-04-04 ENCOUNTER — Ambulatory Visit: Payer: Self-pay

## 2023-04-04 ENCOUNTER — Telehealth: Payer: Self-pay | Admitting: *Deleted

## 2023-04-04 DIAGNOSIS — E1122 Type 2 diabetes mellitus with diabetic chronic kidney disease: Secondary | ICD-10-CM | POA: Diagnosis not present

## 2023-04-04 DIAGNOSIS — I429 Cardiomyopathy, unspecified: Secondary | ICD-10-CM | POA: Diagnosis not present

## 2023-04-04 DIAGNOSIS — I480 Paroxysmal atrial fibrillation: Secondary | ICD-10-CM | POA: Diagnosis not present

## 2023-04-04 DIAGNOSIS — I472 Ventricular tachycardia, unspecified: Secondary | ICD-10-CM | POA: Diagnosis not present

## 2023-04-04 DIAGNOSIS — I97638 Postprocedural hematoma of a circulatory system organ or structure following other circulatory system procedure: Secondary | ICD-10-CM | POA: Diagnosis not present

## 2023-04-04 DIAGNOSIS — N179 Acute kidney failure, unspecified: Secondary | ICD-10-CM | POA: Diagnosis not present

## 2023-04-04 DIAGNOSIS — Q809 Congenital ichthyosis, unspecified: Secondary | ICD-10-CM | POA: Diagnosis not present

## 2023-04-04 DIAGNOSIS — B3731 Acute candidiasis of vulva and vagina: Secondary | ICD-10-CM | POA: Diagnosis not present

## 2023-04-04 DIAGNOSIS — I5032 Chronic diastolic (congestive) heart failure: Secondary | ICD-10-CM | POA: Diagnosis not present

## 2023-04-04 DIAGNOSIS — N1832 Chronic kidney disease, stage 3b: Secondary | ICD-10-CM | POA: Diagnosis not present

## 2023-04-04 DIAGNOSIS — G4730 Sleep apnea, unspecified: Secondary | ICD-10-CM | POA: Diagnosis not present

## 2023-04-04 DIAGNOSIS — I252 Old myocardial infarction: Secondary | ICD-10-CM | POA: Diagnosis not present

## 2023-04-04 DIAGNOSIS — K219 Gastro-esophageal reflux disease without esophagitis: Secondary | ICD-10-CM | POA: Diagnosis not present

## 2023-04-04 DIAGNOSIS — D631 Anemia in chronic kidney disease: Secondary | ICD-10-CM | POA: Diagnosis not present

## 2023-04-04 DIAGNOSIS — I340 Nonrheumatic mitral (valve) insufficiency: Secondary | ICD-10-CM | POA: Diagnosis not present

## 2023-04-04 DIAGNOSIS — I2510 Atherosclerotic heart disease of native coronary artery without angina pectoris: Secondary | ICD-10-CM | POA: Diagnosis not present

## 2023-04-04 DIAGNOSIS — G4700 Insomnia, unspecified: Secondary | ICD-10-CM | POA: Diagnosis not present

## 2023-04-04 DIAGNOSIS — I5023 Acute on chronic systolic (congestive) heart failure: Secondary | ICD-10-CM | POA: Diagnosis not present

## 2023-04-04 DIAGNOSIS — L304 Erythema intertrigo: Secondary | ICD-10-CM | POA: Diagnosis not present

## 2023-04-04 DIAGNOSIS — K449 Diaphragmatic hernia without obstruction or gangrene: Secondary | ICD-10-CM | POA: Diagnosis not present

## 2023-04-04 DIAGNOSIS — I130 Hypertensive heart and chronic kidney disease with heart failure and stage 1 through stage 4 chronic kidney disease, or unspecified chronic kidney disease: Secondary | ICD-10-CM | POA: Diagnosis not present

## 2023-04-04 DIAGNOSIS — H6123 Impacted cerumen, bilateral: Secondary | ICD-10-CM | POA: Diagnosis not present

## 2023-04-04 DIAGNOSIS — R911 Solitary pulmonary nodule: Secondary | ICD-10-CM | POA: Diagnosis not present

## 2023-04-04 DIAGNOSIS — E785 Hyperlipidemia, unspecified: Secondary | ICD-10-CM | POA: Diagnosis not present

## 2023-04-04 DIAGNOSIS — M6529 Calcific tendinitis, multiple sites: Secondary | ICD-10-CM | POA: Diagnosis not present

## 2023-04-04 LAB — URINE CULTURE: Culture: >=100000 — AB

## 2023-04-04 NOTE — Transitions of Care (Post Inpatient/ED Visit) (Signed)
   04/04/2023  Name: Natasha Woodard MRN: [id number] DOB: Sep 26, 1941  Today's TOC FU Call Status: Today's TOC FU Call Status:: Successful TOC FU Call Competed TOC FU Call Complete Date: 04/04/23  Transition Care Management Follow-up Telephone Call Date of Discharge: 04/03/23 Discharge Facility: [facility]) Type of Discharge: Inpatient Admission Primary Inpatient Discharge Diagnosis:: acute kidney injury How have you been since you were released from the hospital?: Same (Patient is vomiting) Any questions or concerns?: Yes Patient Questions/Concerns:: Patient has started vomniting. she didn't want to go back to the hospital. Care gioude assisted in getting patient an earlier appt. RN discussed eating bland soft foods and drink liquids Patient Questions/Concerns Addressed: Other: (rescheduled patient for earlier appt)  Items Reviewed: Did you receive and understand the discharge instructions provided?: Yes Medications obtained and verified?: Yes (Medications Reviewed) Any new allergies since your discharge?: No Dietary orders reviewed?: Yes Type of Diet Ordered:: low sodium heart healthy diet Do you have support at home?: Yes People in Home: spouse Name of Support/Comfort Primary Source: richard  Home Care and Equipment/Supplies: Were Home Health Services Ordered?: NA Any new equipment or medical supplies ordered?: NA  Functional Questionnaire: Do you need assistance with bathing/showering or dressing?: No Do you need assistance with meal preparation?: Yes Do you need assistance with eating?: No Do you have difficulty maintaining continence: No Do you need assistance with getting out of bed/getting out of a chair/moving?: No Do you have difficulty managing or taking your medications?: No  Follow up appointments reviewed: PCP Follow-up appointment confirmed?: Yes Date of PCP follow-up appointment?: 04/06/23 Follow-up Provider: Dr [facility] Follow-up  appointment confirmed?: Yes Date of Specialist follow-up appointment?: 04/07/23 Follow-Up Specialty Provider:: Dr Bryan Lemma Do you need transportation to your follow-up appointment?: No Do you understand care options if your condition(s) worsen?: Yes-patient verbalized understanding  SDOH Interventions Today    Flowsheet Row Most Recent Value  SDOH Interventions   Food Insecurity Interventions Intervention Not Indicated  Housing Interventions Intervention Not Indicated  Transportation Interventions Intervention Not Indicated      Interventions Today    Flowsheet Row Most Recent Value  General Interventions   General Interventions Discussed/Reviewed General Interventions Discussed, General Interventions Reviewed, Doctor Visits  Doctor Visits Discussed/Reviewed Doctor Visits Discussed, Doctor Visits Reviewed  Nutrition Interventions   Nutrition Discussed/Reviewed Nutrition Discussed, Nutrition Reviewed, Fluid intake  [RN discussed low sodium and bland foods while having nausea]  Pharmacy Interventions   Pharmacy Dicussed/Reviewed Pharmacy Topics Discussed, Pharmacy Topics Reviewed        Gean Maidens BSN RN [facility] [phone]

## 2023-04-04 NOTE — Chronic Care Management (AMB) (Signed)
   04/04/2023  Natasha Woodard Apr 22, 1941 [id number]   Reason for Encounter: Patient is not currently enrolled in the CCM program. CCM status changed to previously enrolled.   Katha Cabal RN Care Manager/Chronic Care Management [phone]

## 2023-04-04 NOTE — Transitions of Care (Post Inpatient/ED Visit) (Signed)
   04/04/2023  Name: Natasha Woodard MRN: [id number] DOB: [birth year]  Today's TOC FU Call Status: Today's TOC FU Call Status:: Unsuccessul Call (1st Attempt) Unsuccessful Call (1st Attempt) Date: 04/04/23  Attempted to reach the patient regarding the most recent Inpatient/ED visit.  Follow Up Plan: Additional outreach attempts will be made to reach the patient to complete the Transitions of Care (Post Inpatient/ED visit) call.   Gean Maidens BSN RN [facility] [phone]

## 2023-04-05 ENCOUNTER — Ambulatory Visit: Payer: Medicare Other | Admitting: Pharmacist

## 2023-04-05 ENCOUNTER — Encounter (HOSPITAL_COMMUNITY): Payer: Self-pay

## 2023-04-05 ENCOUNTER — Inpatient Hospital Stay (HOSPITAL_COMMUNITY)
Admission: EM | Admit: 2023-04-05 | Discharge: 2023-04-16 | DRG: 871 | Disposition: A | Discharge status: Home / Self Care | Payer: Medicare Other | Attending: Internal Medicine | Admitting: Internal Medicine

## 2023-04-05 ENCOUNTER — Other Ambulatory Visit: Payer: Self-pay

## 2023-04-05 ENCOUNTER — Emergency Department (HOSPITAL_COMMUNITY): Payer: Medicare Other

## 2023-04-05 ENCOUNTER — Inpatient Hospital Stay (HOSPITAL_COMMUNITY): Payer: Medicare Other

## 2023-04-05 DIAGNOSIS — E875 Hyperkalemia: Secondary | ICD-10-CM | POA: Diagnosis present

## 2023-04-05 DIAGNOSIS — E872 Acidosis, unspecified: Secondary | ICD-10-CM | POA: Diagnosis not present

## 2023-04-05 DIAGNOSIS — I428 Other cardiomyopathies: Secondary | ICD-10-CM | POA: Diagnosis present

## 2023-04-05 DIAGNOSIS — I272 Pulmonary hypertension, unspecified: Secondary | ICD-10-CM | POA: Diagnosis present

## 2023-04-05 DIAGNOSIS — Z87891 Personal history of nicotine dependence: Secondary | ICD-10-CM

## 2023-04-05 DIAGNOSIS — I252 Old myocardial infarction: Secondary | ICD-10-CM

## 2023-04-05 DIAGNOSIS — R7401 Elevation of levels of liver transaminase levels: Secondary | ICD-10-CM | POA: Diagnosis not present

## 2023-04-05 DIAGNOSIS — I5023 Acute on chronic systolic (congestive) heart failure: Secondary | ICD-10-CM | POA: Diagnosis not present

## 2023-04-05 DIAGNOSIS — Z7901 Long term (current) use of anticoagulants: Secondary | ICD-10-CM

## 2023-04-05 DIAGNOSIS — R109 Unspecified abdominal pain: Secondary | ICD-10-CM | POA: Diagnosis not present

## 2023-04-05 DIAGNOSIS — Z8249 Family history of ischemic heart disease and other diseases of the circulatory system: Secondary | ICD-10-CM

## 2023-04-05 DIAGNOSIS — K5900 Constipation, unspecified: Secondary | ICD-10-CM | POA: Diagnosis present

## 2023-04-05 DIAGNOSIS — I10 Essential (primary) hypertension: Secondary | ICD-10-CM | POA: Diagnosis not present

## 2023-04-05 DIAGNOSIS — I2510 Atherosclerotic heart disease of native coronary artery without angina pectoris: Secondary | ICD-10-CM | POA: Diagnosis not present

## 2023-04-05 DIAGNOSIS — I255 Ischemic cardiomyopathy: Secondary | ICD-10-CM | POA: Diagnosis not present

## 2023-04-05 DIAGNOSIS — K5730 Diverticulosis of large intestine without perforation or abscess without bleeding: Secondary | ICD-10-CM | POA: Diagnosis not present

## 2023-04-05 DIAGNOSIS — J939 Pneumothorax, unspecified: Secondary | ICD-10-CM | POA: Diagnosis not present

## 2023-04-05 DIAGNOSIS — A419 Sepsis, unspecified organism: Secondary | ICD-10-CM | POA: Diagnosis not present

## 2023-04-05 DIAGNOSIS — E876 Hypokalemia: Secondary | ICD-10-CM | POA: Diagnosis not present

## 2023-04-05 DIAGNOSIS — E869 Volume depletion, unspecified: Secondary | ICD-10-CM | POA: Diagnosis not present

## 2023-04-05 DIAGNOSIS — I130 Hypertensive heart and chronic kidney disease with heart failure and stage 1 through stage 4 chronic kidney disease, or unspecified chronic kidney disease: Secondary | ICD-10-CM | POA: Diagnosis not present

## 2023-04-05 DIAGNOSIS — Z79899 Other long term (current) drug therapy: Secondary | ICD-10-CM

## 2023-04-05 DIAGNOSIS — I5082 Biventricular heart failure: Secondary | ICD-10-CM | POA: Diagnosis not present

## 2023-04-05 DIAGNOSIS — I110 Hypertensive heart disease with heart failure: Secondary | ICD-10-CM | POA: Diagnosis not present

## 2023-04-05 DIAGNOSIS — R6521 Severe sepsis with septic shock: Secondary | ICD-10-CM | POA: Diagnosis present

## 2023-04-05 DIAGNOSIS — R319 Hematuria, unspecified: Secondary | ICD-10-CM | POA: Diagnosis not present

## 2023-04-05 DIAGNOSIS — Z955 Presence of coronary angioplasty implant and graft: Secondary | ICD-10-CM

## 2023-04-05 DIAGNOSIS — N1832 Chronic kidney disease, stage 3b: Secondary | ICD-10-CM | POA: Diagnosis not present

## 2023-04-05 DIAGNOSIS — Z66 Do not resuscitate: Secondary | ICD-10-CM | POA: Diagnosis present

## 2023-04-05 DIAGNOSIS — Z7902 Long term (current) use of antithrombotics/antiplatelets: Secondary | ICD-10-CM

## 2023-04-05 DIAGNOSIS — I493 Ventricular premature depolarization: Secondary | ICD-10-CM | POA: Diagnosis present

## 2023-04-05 DIAGNOSIS — E11649 Type 2 diabetes mellitus with hypoglycemia without coma: Secondary | ICD-10-CM | POA: Diagnosis not present

## 2023-04-05 DIAGNOSIS — R112 Nausea with vomiting, unspecified: Secondary | ICD-10-CM

## 2023-04-05 DIAGNOSIS — B964 Proteus (mirabilis) (morganii) as the cause of diseases classified elsewhere: Secondary | ICD-10-CM | POA: Diagnosis not present

## 2023-04-05 DIAGNOSIS — E7800 Pure hypercholesterolemia, unspecified: Secondary | ICD-10-CM

## 2023-04-05 DIAGNOSIS — N170 Acute kidney failure with tubular necrosis: Secondary | ICD-10-CM | POA: Diagnosis not present

## 2023-04-05 DIAGNOSIS — N390 Urinary tract infection, site not specified: Secondary | ICD-10-CM

## 2023-04-05 DIAGNOSIS — I509 Heart failure, unspecified: Secondary | ICD-10-CM | POA: Insufficient documentation

## 2023-04-05 DIAGNOSIS — I480 Paroxysmal atrial fibrillation: Secondary | ICD-10-CM | POA: Diagnosis not present

## 2023-04-05 DIAGNOSIS — R079 Chest pain, unspecified: Secondary | ICD-10-CM | POA: Diagnosis not present

## 2023-04-05 DIAGNOSIS — Z882 Allergy status to sulfonamides status: Secondary | ICD-10-CM

## 2023-04-05 DIAGNOSIS — E1151 Type 2 diabetes mellitus with diabetic peripheral angiopathy without gangrene: Secondary | ICD-10-CM | POA: Diagnosis present

## 2023-04-05 DIAGNOSIS — Z95810 Presence of automatic (implantable) cardiac defibrillator: Secondary | ICD-10-CM

## 2023-04-05 DIAGNOSIS — D649 Anemia, unspecified: Secondary | ICD-10-CM | POA: Diagnosis not present

## 2023-04-05 DIAGNOSIS — N179 Acute kidney failure, unspecified: Secondary | ICD-10-CM | POA: Diagnosis present

## 2023-04-05 DIAGNOSIS — I5030 Unspecified diastolic (congestive) heart failure: Secondary | ICD-10-CM | POA: Diagnosis not present

## 2023-04-05 DIAGNOSIS — K7200 Acute and subacute hepatic failure without coma: Secondary | ICD-10-CM | POA: Diagnosis present

## 2023-04-05 DIAGNOSIS — J9601 Acute respiratory failure with hypoxia: Secondary | ICD-10-CM | POA: Diagnosis present

## 2023-04-05 DIAGNOSIS — J90 Pleural effusion, not elsewhere classified: Secondary | ICD-10-CM | POA: Diagnosis not present

## 2023-04-05 DIAGNOSIS — J811 Chronic pulmonary edema: Secondary | ICD-10-CM | POA: Diagnosis not present

## 2023-04-05 DIAGNOSIS — Z888 Allergy status to other drugs, medicaments and biological substances status: Secondary | ICD-10-CM

## 2023-04-05 DIAGNOSIS — R918 Other nonspecific abnormal finding of lung field: Secondary | ICD-10-CM | POA: Diagnosis not present

## 2023-04-05 DIAGNOSIS — E1122 Type 2 diabetes mellitus with diabetic chronic kidney disease: Secondary | ICD-10-CM | POA: Diagnosis not present

## 2023-04-05 DIAGNOSIS — I340 Nonrheumatic mitral (valve) insufficiency: Secondary | ICD-10-CM | POA: Diagnosis not present

## 2023-04-05 DIAGNOSIS — Z90710 Acquired absence of both cervix and uterus: Secondary | ICD-10-CM

## 2023-04-05 DIAGNOSIS — E785 Hyperlipidemia, unspecified: Secondary | ICD-10-CM | POA: Diagnosis not present

## 2023-04-05 DIAGNOSIS — Z743 Need for continuous supervision: Secondary | ICD-10-CM | POA: Diagnosis not present

## 2023-04-05 DIAGNOSIS — I2583 Coronary atherosclerosis due to lipid rich plaque: Secondary | ICD-10-CM | POA: Diagnosis not present

## 2023-04-05 DIAGNOSIS — G4733 Obstructive sleep apnea (adult) (pediatric): Secondary | ICD-10-CM | POA: Diagnosis present

## 2023-04-05 DIAGNOSIS — Z833 Family history of diabetes mellitus: Secondary | ICD-10-CM

## 2023-04-05 DIAGNOSIS — Z452 Encounter for adjustment and management of vascular access device: Secondary | ICD-10-CM | POA: Diagnosis not present

## 2023-04-05 DIAGNOSIS — I5022 Chronic systolic (congestive) heart failure: Secondary | ICD-10-CM | POA: Diagnosis present

## 2023-04-05 DIAGNOSIS — R0682 Tachypnea, not elsewhere classified: Secondary | ICD-10-CM | POA: Diagnosis not present

## 2023-04-05 DIAGNOSIS — Z883 Allergy status to other anti-infective agents status: Secondary | ICD-10-CM

## 2023-04-05 DIAGNOSIS — N19 Unspecified kidney failure: Secondary | ICD-10-CM | POA: Diagnosis not present

## 2023-04-05 LAB — CBC WITH DIFFERENTIAL/PLATELET
Abs Immature Granulocytes: 0.04 10*3/uL (ref 0.00–0.07)
Basophils Absolute: 0 10*3/uL (ref 0.0–0.1)
Basophils Relative: 0 %
Eosinophils Absolute: 0 10*3/uL (ref 0.0–0.5)
Eosinophils Relative: 0 %
HCT: 47.1 % — ABNORMAL HIGH (ref 36.0–46.0)
Hemoglobin: 13.7 g/dL (ref 12.0–15.0)
Immature Granulocytes: 1 %
Lymphocytes Relative: 5 %
Lymphs Abs: 0.4 10*3/uL — ABNORMAL LOW (ref 0.7–4.0)
MCH: 29.3 pg (ref 26.0–34.0)
MCHC: 29.1 g/dL — ABNORMAL LOW (ref 30.0–36.0)
MCV: 100.6 fL — ABNORMAL HIGH (ref 80.0–100.0)
Monocytes Absolute: 0.8 10*3/uL (ref 0.1–1.0)
Monocytes Relative: 12 %
Neutro Abs: 5.4 10*3/uL (ref 1.7–7.7)
Neutrophils Relative %: 82 %
Platelets: 251 10*3/uL (ref 150–400)
RBC: 4.68 MIL/uL (ref 3.87–5.11)
RDW: 15.8 % — ABNORMAL HIGH (ref 11.5–15.5)
WBC: 6.5 10*3/uL (ref 4.0–10.5)
nRBC: 0.5 % — ABNORMAL HIGH (ref 0.0–0.2)

## 2023-04-05 LAB — I-STAT VENOUS BLOOD GAS, ED
Acid-base deficit: 18 mmol/L — ABNORMAL HIGH (ref 0.0–2.0)
Acid-base deficit: 20 mmol/L — ABNORMAL HIGH (ref 0.0–2.0)
Bicarbonate: 11.3 mmol/L — ABNORMAL LOW (ref 20.0–28.0)
Bicarbonate: 9.3 mmol/L — ABNORMAL LOW (ref 20.0–28.0)
Calcium, Ion: 1.12 mmol/L — ABNORMAL LOW (ref 1.15–1.40)
Calcium, Ion: 1.11 mmol/L — ABNORMAL LOW (ref 1.15–1.40)
HCT: 46 % (ref 36.0–46.0)
HCT: 42 % (ref 36.0–46.0)
Hemoglobin: 15.6 g/dL — ABNORMAL HIGH (ref 12.0–15.0)
Hemoglobin: 14.3 g/dL (ref 12.0–15.0)
O2 Saturation: 61 %
O2 Saturation: 98 %
Potassium: 5.8 mmol/L — ABNORMAL HIGH (ref 3.5–5.1)
Potassium: 5.1 mmol/L (ref 3.5–5.1)
Sodium: 136 mmol/L (ref 135–145)
Sodium: 136 mmol/L (ref 135–145)
TCO2: 12 mmol/L — ABNORMAL LOW (ref 22–32)
TCO2: 10 mmol/L — ABNORMAL LOW (ref 22–32)
pCO2, Ven: 39.5 mmHg — ABNORMAL LOW (ref 44–60)
pCO2, Ven: 33.7 mmHg — ABNORMAL LOW (ref 44–60)
pH, Ven: 7.064 — CL (ref 7.25–7.43)
pH, Ven: 7.047 — CL (ref 7.25–7.43)
pO2, Ven: 44 mmHg (ref 32–45)
pO2, Ven: 155 mmHg — ABNORMAL HIGH (ref 32–45)

## 2023-04-05 LAB — I-STAT CHEM 8, ED
BUN: 54 mg/dL — ABNORMAL HIGH (ref 8–23)
Calcium, Ion: 1.12 mmol/L — ABNORMAL LOW (ref 1.15–1.40)
Chloride: 105 mmol/L (ref 98–111)
Creatinine, Ser: 4.4 mg/dL — ABNORMAL HIGH (ref 0.44–1.00)
Glucose, Bld: 54 mg/dL — ABNORMAL LOW (ref 70–99)
HCT: 47 % — ABNORMAL HIGH (ref 36.0–46.0)
Hemoglobin: 16 g/dL — ABNORMAL HIGH (ref 12.0–15.0)
Potassium: 5.8 mmol/L — ABNORMAL HIGH (ref 3.5–5.1)
Sodium: 137 mmol/L (ref 135–145)
TCO2: 14 mmol/L — ABNORMAL LOW (ref 22–32)

## 2023-04-05 LAB — COOXEMETRY PANEL
Carboxyhemoglobin: 1.3 % (ref 0.5–1.5)
Methemoglobin: 1.1 % (ref 0.0–1.5)
O2 Saturation: 72.7 %
Total hemoglobin: 12.4 g/dL (ref 12.0–16.0)

## 2023-04-05 LAB — CBG MONITORING, ED
Glucose-Capillary: 149 mg/dL — ABNORMAL HIGH (ref 70–99)
Glucose-Capillary: 159 mg/dL — ABNORMAL HIGH (ref 70–99)

## 2023-04-05 LAB — TROPONIN I (HIGH SENSITIVITY)
Troponin I (High Sensitivity): 75 ng/L — ABNORMAL HIGH (ref <18)
Troponin I (High Sensitivity): 76 ng/L — ABNORMAL HIGH (ref <18)

## 2023-04-05 LAB — COMPREHENSIVE METABOLIC PANEL
ALT: 723 U/L — ABNORMAL HIGH (ref 0–44)
AST: 804 U/L — ABNORMAL HIGH (ref 15–41)
Albumin: 3.7 g/dL (ref 3.5–5.0)
Alkaline Phosphatase: 262 U/L — ABNORMAL HIGH (ref 38–126)
Anion gap: 29 — ABNORMAL HIGH (ref 5–15)
BUN: 57 mg/dL — ABNORMAL HIGH (ref 8–23)
CO2: 11 mmol/L — ABNORMAL LOW (ref 22–32)
Calcium: 10.2 mg/dL (ref 8.9–10.3)
Chloride: 99 mmol/L (ref 98–111)
Creatinine, Ser: 4.29 mg/dL — ABNORMAL HIGH (ref 0.44–1.00)
GFR, Estimated: 10 mL/min — ABNORMAL LOW (ref >60)
Glucose, Bld: 62 mg/dL — ABNORMAL LOW (ref 70–99)
Potassium: 6 mmol/L — ABNORMAL HIGH (ref 3.5–5.1)
Sodium: 139 mmol/L (ref 135–145)
Total Bilirubin: 2.7 mg/dL — ABNORMAL HIGH (ref 0.3–1.2)
Total Protein: 7.2 g/dL (ref 6.5–8.1)

## 2023-04-05 LAB — LACTIC ACID, PLASMA
Lactic Acid, Venous: >9 mmol/L (ref 0.5–1.9)
Lactic Acid, Venous: >9 mmol/L (ref 0.5–1.9)

## 2023-04-05 LAB — BRAIN NATRIURETIC PEPTIDE: B Natriuretic Peptide: 2550.1 pg/mL — ABNORMAL HIGH (ref 0.0–100.0)

## 2023-04-05 LAB — LIPASE, BLOOD: Lipase: 28 U/L (ref 11–51)

## 2023-04-05 MED ORDER — METOCLOPRAMIDE HCL 5 MG/ML IJ SOLN
10.0000 mg | Freq: Once | INTRAMUSCULAR | Status: AC
  Administered 2023-04-05: 10 mg via INTRAVENOUS
  Filled 2023-04-05: qty 2

## 2023-04-05 MED ORDER — INSULIN ASPART 100 UNIT/ML IJ SOLN
1.0000 [IU] | INTRAMUSCULAR | Status: DC
  Administered 2023-04-05 – 2023-04-06 (×3): 2 [IU] via SUBCUTANEOUS
  Administered 2023-04-06: 3 [IU] via SUBCUTANEOUS
  Administered 2023-04-06 – 2023-04-07 (×2): 1 [IU] via SUBCUTANEOUS

## 2023-04-05 MED ORDER — ALBUTEROL SULFATE (2.5 MG/3ML) 0.083% IN NEBU
10.0000 mg | INHALATION_SOLUTION | Freq: Once | RESPIRATORY_TRACT | Status: AC
  Administered 2023-04-05: 10 mg via RESPIRATORY_TRACT
  Filled 2023-04-05: qty 12

## 2023-04-05 MED ORDER — ONDANSETRON 4 MG PO TBDP
4.0000 mg | ORAL_TABLET | Freq: Three times a day (TID) | ORAL | 0 refills | Status: DC | PRN
Start: 2023-04-05 — End: 2023-05-10

## 2023-04-05 MED ORDER — INSULIN ASPART 100 UNIT/ML IV SOLN
5.0000 [IU] | Freq: Once | INTRAVENOUS | Status: AC
  Administered 2023-04-05: 5 [IU] via INTRAVENOUS

## 2023-04-05 MED ORDER — BISACODYL 10 MG RE SUPP
10.0000 mg | Freq: Once | RECTAL | Status: AC
  Administered 2023-04-06: 10 mg via RECTAL
  Filled 2023-04-05: qty 1

## 2023-04-05 MED ORDER — HEPARIN SODIUM (PORCINE) 5000 UNIT/ML IJ SOLN: 5000.0000 [IU] | Freq: Three times a day (TID) | INTRAMUSCULAR | Status: DC

## 2023-04-05 MED ORDER — CALCIUM GLUCONATE-NACL 1-0.675 GM/50ML-% IV SOLN
1.0000 g | Freq: Once | INTRAVENOUS | Status: AC
  Administered 2023-04-05: 1000 mg via INTRAVENOUS
  Filled 2023-04-05: qty 50

## 2023-04-05 MED ORDER — ONDANSETRON HCL 4 MG/2ML IJ SOLN
4.0000 mg | Freq: Once | INTRAMUSCULAR | Status: AC
  Administered 2023-04-05: 4 mg via INTRAVENOUS
  Filled 2023-04-05: qty 2

## 2023-04-05 MED ORDER — SODIUM BICARBONATE 8.4 % IV SOLN
50.0000 meq | Freq: Once | INTRAVENOUS | Status: AC
  Administered 2023-04-05: 50 meq via INTRAVENOUS
  Filled 2023-04-05: qty 50

## 2023-04-05 MED ORDER — FENTANYL CITRATE PF 50 MCG/ML IJ SOSY
50.0000 ug | PREFILLED_SYRINGE | Freq: Once | INTRAMUSCULAR | Status: AC
  Administered 2023-04-05: 50 ug via INTRAVENOUS
  Filled 2023-04-05: qty 1

## 2023-04-05 MED ORDER — SODIUM CHLORIDE 0.9 % IV SOLN
500.0000 mg | INTRAVENOUS | Status: DC
  Administered 2023-04-05 – 2023-04-06 (×2): 500 mg via INTRAVENOUS
  Filled 2023-04-05 (×2): qty 5

## 2023-04-05 MED ORDER — MIDAZOLAM HCL 2 MG/2ML IJ SOLN
1.0000 mg | Freq: Once | INTRAMUSCULAR | Status: AC | PRN
  Administered 2023-04-05: 1 mg via INTRAVENOUS
  Filled 2023-04-05: qty 2

## 2023-04-05 MED ORDER — SODIUM BICARBONATE 8.4 % IV SOLN
INTRAVENOUS | Status: DC
  Filled 2023-04-05: qty 1000

## 2023-04-05 MED ORDER — ONDANSETRON HCL 4 MG/2ML IJ SOLN: 4.0000 mg | Freq: Four times a day (QID) | INTRAMUSCULAR | Status: DC | PRN

## 2023-04-05 MED ORDER — POLYETHYLENE GLYCOL 3350 17 G PO PACK: 17.0000 g | PACK | Freq: Every day | ORAL | Status: DC | PRN

## 2023-04-05 MED ORDER — DEXTROSE 50 % IV SOLN
1.0000 | Freq: Once | INTRAVENOUS | Status: AC
  Administered 2023-04-05: 50 mL via INTRAVENOUS
  Filled 2023-04-05: qty 50

## 2023-04-05 MED ORDER — DOCUSATE SODIUM 100 MG PO CAPS: 100.0000 mg | ORAL_CAPSULE | Freq: Two times a day (BID) | ORAL | Status: DC | PRN

## 2023-04-05 NOTE — ED Notes (Signed)
Bladder scan 11ml

## 2023-04-05 NOTE — Consult Note (Signed)
[facility]  INPATIENT CONSULTATION  Reason for Consultation: AKI and acidosis Requesting Provider: Dr. Lynelle Doctor  HPI: Natasha Woodard is an 82 y.o. female with DM, CAD h/o MI 01/2023 (complicated course), HFpEF, PAD, OSA, A fib, CKD3 currently admitted with N/V, severe acidosis and AKI for which nephrology is consulted.   Prolonged hospitalization 01/11/23 - 02/19/23 MI, cardiac arrest, cardiogenic shock req IABP, s/p ICD discharged to CIR where she stayed 3/10-3/19/24.  Readmitted 4/19-22 with N/V/abd pain with unrevealing CT.  Found to have enterotoxigenic E. coli ; AKI Cr ~3 from recent baseline 1.3-1.6mg /dL with neg renal US.  Improved with fluids, withholding ARB and diuretics to 2.4 at discharge.    Returned to ED today with N/V - triage VS 122/64, O2 96% ra, afebrile.  Labs showing Na 139, K 6, Bicarb 11, BUN 57, Cr 4.3, AG 29, AST 804, ALT 723 (from normal 7/20), T bili 2.7 (normal 4/20), BNP 2550; VBG 7.06, 38, lactate 9.   CXR mild pulm edema vs infection (favor former).  Renal US normal.  Due to increased WOB placed on bipap.  Potassium managed medically with shifting and given 1 amp bicarb + bicarb gtt.  PCCM consulted for admission.    Pt currently awake and alert on bipap with family bedside.  N/V/abd pain with po intake but not with palpation x days -- dehydrated.  Was taking meds until today - inc torsemide 20 daily but doesn't appear to have been on MRB or ARB.  Decreased urine volume, normal character.   +LE edema recently.  No NSAID, no contrast.   Pt getting central line and going to ICU.    PMH: Past Medical History:  Diagnosis Date   AICD (automatic cardioverter/defibrillator) present    Calcific tendonitis 2008   Treatment of left leg   Cardiomyopathy 06/2017   a) Echo: EF 25-30%. GR 1-2 DD w/ elevated LVEDP. Mild valvular Dz; b) Cardiac MRI 2/'19: frequent PVCs (diffiuclt to interpret) - EF ~27% w/ diffuse HK.  No evidence of infarct, infiltrative Dz or  myocarditis. -- ? if related to PVCs. c) f/u Echo 6/'19: EF 35-40%. Gr 1 DD. Diffuse HK.-> d) Jan 2020 EF 50 to 55%.  Mod MAC, mod LAE.  Ao Sclerosis; e) Echo 1/'21 - EF 60-65%, Gr II DD. Mild Mod MR.    Chronic combined systolic and diastolic CHF, NYHA class 2 and ACA/AHA stage C    Now essentially resolved, back to simply diastolic CHF   Coronary artery disease, non-occlusive    mild-moderate CAD 06/30/17 cath   CTS (carpal tunnel syndrome)    Diabetes mellitus    Type II   Dysrhythmia    patient said that she cant remember what it is   Frequent unifocal PVCs 01/2018   Event Monitor: NSR, W/ frequent multifocal PVCs (9%-down from 30% prior to amiodarone) and PACs.  Nighttime bradycardia suggestive of OSA.';  Zio patch September 2023: PVC burden now 6.1%.   Hiatal hernia    with reflux   Hyperlipidemia    Statin intolerant   Hypertension    Ichthyosis congenita    Intermittent claudication of both lower extremities due to atherosclerosis 08/22/2021   LEA Dopplers 09/09/2021:  Right: Total occlusion noted in the superficial femoral artery. Atherosclerosis noted throughout extremity, see note above. Three vessel runoff.  Left: Total occlusion noted in the superficial femoral artery and/or popliteal artery. Total occlusion noted in the distal anterior tibial artery. Athereosclerosis noted throughout extremity.  NSVD (normal spontaneous vaginal delivery) 1971 & 1972   Obesity    Plantar fasciitis    Left   Presence of permanent cardiac pacemaker    Pulmonary nodule    Imaged multiple times and benign appearing   Sleep apnea    Wears glasses    PSH: Past Surgical History:  Procedure Laterality Date   ABDOMINAL HYSTERECTOMY  1975   TAH-BSO   ABSCESS DRAINAGE  1972   right breast   APPLICATION OF WOUND VAC Right 01/15/2023   Procedure: APPLICATION OF INCISIONAL WOUND VAC;  Surgeon: Victorino Sparrow, MD;  Location: [facility] OR;  Service: Vascular;  Laterality: Right;   APPLICATION OF WOUND  VAC Right 01/26/2023   Procedure: APPLICATION OF WOUND VAC RIGHT GROIN WASHOUT AND SARTORIUS MUSCLE FLAP;  Surgeon: Victorino Sparrow, MD;  Location: [facility] OR;  Service: Vascular;  Laterality: Right;   ARTERY REPAIR Right 01/15/2023   Procedure: RIGHT PROFUNDA ARTERY REPAIR WITH 2000G MYRIAD MORCELLS;  Surgeon: Victorino Sparrow, MD;  Location: [facility] OR;  Service: Vascular;  Laterality: Right;   BREAST BIOPSY Left 01/14/2009   Stereo- Benign   CARDIAC MRI  01/2018   Difficult to interpret 2/2 PVCs. Normal LV size - EF ~27% with diffuse HK. Mild RV dilation - normal fxn. -- NO MYOCARDIAL LGI => no definitive evidence of prior MI, Infiltrative Dz or Myocarditis -- suspect NICM, possibly related to PVCs.   CARPAL TUNNEL RELEASE Right 07/02/2014   Procedure: RIGHT CARPAL TUNNEL RELEASE;  Surgeon: Nicki Reaper, MD;  Location: [facility];  Service: Orthopedics;  Laterality: Right;   CARPAL TUNNEL RELEASE Left 06/11/2015   Procedure: LEFT CARPAL TUNNEL RELEASE;  Surgeon: Cindee Salt, MD;  Location: [facility];  Service: Orthopedics;  Laterality: Left;  REGIONAL/FAB   COLONOSCOPY     corn removal  1968   right   CORONARY STENT INTERVENTION N/A 01/10/2023   Procedure: CORONARY STENT INTERVENTION;  Surgeon: Lennette Bihari, MD;  Location: [facility] INVASIVE CV LAB;  Service: Cardiovascular;  Laterality: N/A;   GROIN DEBRIDEMENT Right 02/06/2023   Procedure: RIGHT GROIN DEBRIDEMENT WITH WOUND VAC CHANGE;  Surgeon: Victorino Sparrow, MD;  Location: [facility] OR;  Service: Vascular;  Laterality: Right;   HEMATOMA EVACUATION Right 01/15/2023   Procedure: EVACUATION HEMATOMA RIGHT GROIN;  Surgeon: Victorino Sparrow, MD;  Location: [facility] OR;  Service: Vascular;  Laterality: Right;   Holter Monitor  06/2017   ~17,000 PVC beats - majority were singlets, some couplets. 44 brief 3-7 beat runs of NSVT. Also noted were less frequent PACs with 11 runs (longest 15 beats)   IABP INSERTION Right 01/10/2023   Procedure: IABP  Insertion;  Surgeon: Lennette Bihari, MD;  Location: [facility] INVASIVE CV LAB;  Service: Cardiovascular;  Laterality: Right;   ICD IMPLANT N/A 01/23/2023   Procedure: ICD IMPLANT;  Surgeon: Marinus Maw, MD;  Location: [facility] INVASIVE CV LAB;  Service: Cardiovascular;  Laterality: N/A;   INCISION AND DRAINAGE OF WOUND Right 01/31/2023   Procedure: IRRIGATION AND DEBRIDEMENT RIGHT GROIN WOUND AND WOUND VAC CHANGE;  Surgeon: Victorino Sparrow, MD;  Location: [facility] OR;  Service: Vascular;  Laterality: Right;   OPEN REDUCTION INTERNAL FIXATION (ORIF) DISTAL RADIAL FRACTURE Left 07/21/2017   Procedure: OPEN REDUCTION INTERNAL FIXATION (ORIF) LEFT DISTAL RADIAL FRACTURE;  Surgeon: Sheral Apley, MD;  Location: [facility] OR;  Service: Orthopedics;  Laterality: Left;   RIGHT/LEFT HEART CATH AND CORONARY ANGIOGRAPHY N/A 06/30/2017  Procedure: Right/Left Heart Cath and Coronary Angiography;  Surgeon: Marykay Lex, MD;  Location: [facility] INVASIVE CV LAB:  pRCA 55%, pCx 40%, OM1 45%, mCx 50%, D2 50% - LVEF 25-35%. Moderately elevated LVEDP(26 mmHg with PCWP 16 mmHg).  FICK CO/CI: 4.47/2.48. PA pressures 47/14 mmHg with a mean of 27 mm.   RIGHT/LEFT HEART CATH AND CORONARY ANGIOGRAPHY N/A 01/10/2023   Procedure: RIGHT/LEFT HEART CATH AND CORONARY ANGIOGRAPHY;  Surgeon: Lennette Bihari, MD;  Location: [facility] INVASIVE CV LAB;  Service: Cardiovascular;  Laterality: N/A;   ROTATOR CUFF REPAIR     left shoulder   TEMPORARY PACEMAKER N/A 01/19/2023   Procedure: TEMPORARY PACEMAKER;  Surgeon: Dolores Patty, MD;  Location: [facility] INVASIVE CV LAB;  Service: Cardiovascular;  Laterality: N/A;   TRANSTHORACIC ECHOCARDIOGRAM  06/2017   EF 25 and 30%. GR 1 DD. Mild diastolic dysfunction with elevated LVEDP. Mild valvular disease.   TRANSTHORACIC ECHOCARDIOGRAM  11'18, 6/'19    a) EF remains 30-35%.  Diffuse hypokinesis noted.  Severe LA dilation; b) Improved EF 35-40%.  Diffuse HK.  GR 1 DD.  Moderate TR.  Mild RV dilation.   TRANSTHORACIC  ECHOCARDIOGRAM  01/28/2022   Follow-up echo: EF 50 to 55%.  No RWMA.  GRII DD.  Severe LA dilation.  Mild RA dilation with normal PAP and RAP.  Mild to moderate TR (no significant change noted)   Zio patch  08/2022   3-day: Currently NSR (HR range 47-91 with average 64 bpm) 1 F AVB with IVCD/BBB.  Rare isolated PACs (<1%).  Frequent PVCs (6.1% with rare couplets and triplets.  Also bigeminy.  2 atrial runs 8 beats 5 2 seconds.  No sustained arrhythmias.   ZIO Patch 14 d Event Monitor  10/2018   Results as below < 1% PVC.  (Notably improved after starting amiodarone)     Past Medical History:  Diagnosis Date   AICD (automatic cardioverter/defibrillator) present    Calcific tendonitis 2008   Treatment of left leg   Cardiomyopathy 06/2017   a) Echo: EF 25-30%. GR 1-2 DD w/ elevated LVEDP. Mild valvular Dz; b) Cardiac MRI 2/'19: frequent PVCs (diffiuclt to interpret) - EF ~27% w/ diffuse HK.  No evidence of infarct, infiltrative Dz or myocarditis. -- ? if related to PVCs. c) f/u Echo 6/'19: EF 35-40%. Gr 1 DD. Diffuse HK.-> d) Jan 2020 EF 50 to 55%.  Mod MAC, mod LAE.  Ao Sclerosis; e) Echo 1/'21 - EF 60-65%, Gr II DD. Mild Mod MR.    Chronic combined systolic and diastolic CHF, NYHA class 2 and ACA/AHA stage C    Now essentially resolved, back to simply diastolic CHF   Coronary artery disease, non-occlusive    mild-moderate CAD 06/30/17 cath   CTS (carpal tunnel syndrome)    Diabetes mellitus    Type II   Dysrhythmia    patient said that she cant remember what it is   Frequent unifocal PVCs 01/2018   Event Monitor: NSR, W/ frequent multifocal PVCs (9%-down from 30% prior to amiodarone) and PACs.  Nighttime bradycardia suggestive of OSA.';  Zio patch September 2023: PVC burden now 6.1%.   Hiatal hernia    with reflux   Hyperlipidemia    Statin intolerant   Hypertension    Ichthyosis congenita    Intermittent claudication of both lower extremities due to atherosclerosis 08/22/2021   LEA  Dopplers 09/09/2021:  Right: Total occlusion noted in the superficial femoral artery. Atherosclerosis noted throughout extremity, see note  above. Three vessel runoff.  Left: Total occlusion noted in the superficial femoral artery and/or popliteal artery. Total occlusion noted in the distal anterior tibial artery. Athereosclerosis noted throughout extremity.    NSVD (normal spontaneous vaginal delivery) 1971 & 1972   Obesity    Plantar fasciitis    Left   Presence of permanent cardiac pacemaker    Pulmonary nodule    Imaged multiple times and benign appearing   Sleep apnea    Wears glasses     Medications:  I have reviewed the patient's current medications.  (Not in a hospital admission)   ALLERGIES:   Allergies  Allergen Reactions   Ezetimibe-Simvastatin Other (See Comments)    REACTION: Muscle aches (side effect)   Lipitor [Atorvastatin] Other (See Comments)    Leg weakness    Claritin [Loratadine]     Possible cause of nightmares.     Spironolactone     Caution re: elevated potassium/creatinine   Tamiflu [Oseltamivir Phosphate] Other (See Comments)    nightmares   Pravastatin Sodium Other (See Comments)    REACTION: Muscle aches (side effect)   Sulfonamide Derivatives Nausea And Vomiting    FAM HX: Family History  Problem Relation Age of Onset   Heart disease Mother        s/p pacemaker   Diabetes Mother    Heart disease Father    Diabetes Father    Heart disease Brother        MI   Cancer Brother        Lung   Hypertension Other    Colon cancer Neg Hx    Breast cancer Neg Hx     Social History:   reports that she quit smoking about 29 years ago. Her smoking use included cigarettes. She has never been exposed to tobacco smoke. She has never used smokeless tobacco. She reports that she does not drink alcohol and does not use drugs.  ROS: 12 system ROS per HPI otherwise neg  Blood pressure (!) 116/50, pulse 72, temperature 98 F (36.7 C), temperature source  Oral, resp. rate (!) 24, height 5\' 2"  (1.575 m), weight 70.5 kg, SpO2 100 %. PHYSICAL EXAM: Gen: elderly woman lying flat on bipap, ill appearing but not in distress  Eyes: anicteric, EOMI ENT: mouth dry on Bipap Neck: supple CV:  RRR, no rub Abd:  soft, no rebound or guarding but very mildly TTP throughout Lungs: clear ant and laterally, no appreciable wheezes or rales GU: no foley Extr: 1+ tibial edema midway to knees Neuro: awake, alert, conversant, moving 4 extr with purpose Skin: dry with some scaly changes, no wounds or erythema noted   Results for orders placed or performed during the hospital encounter of 04/05/23 (from the past 48 hour(s))  Brain natriuretic peptide     Status: Abnormal   Collection Time: 04/05/23  6:20 PM  Result Value Ref Range   B Natriuretic Peptide 2,550.1 (H) 0.0 - 100.0 pg/mL    Comment: Performed at [facility], [address], [place], [place] [address]  CBC with Differential/Platelet     Status: Abnormal   Collection Time: 04/05/23  6:20 PM  Result Value Ref Range   WBC 6.5 4.0 - 10.5 K/uL   RBC 4.68 3.87 - 5.11 MIL/uL   Hemoglobin 13.7 12.0 - 15.0 g/dL   HCT 60.4 (H) 54.0 - 98.1 %   MCV 100.6 (H) 80.0 - 100.0 fL   MCH 29.3 26.0 - 34.0 pg  MCHC 29.1 (L) 30.0 - 36.0 g/dL   RDW 16.1 (H) 09.6 - 04.5 %   Platelets 251 150 - 400 K/uL   nRBC 0.5 (H) 0.0 - 0.2 %   Neutrophils Relative % 82 %   Neutro Abs 5.4 1.7 - 7.7 K/uL   Lymphocytes Relative 5 %   Lymphs Abs 0.4 (L) 0.7 - 4.0 K/uL   Monocytes Relative 12 %   Monocytes Absolute 0.8 0.1 - 1.0 K/uL   Eosinophils Relative 0 %   Eosinophils Absolute 0.0 0.0 - 0.5 K/uL   Basophils Relative 0 %   Basophils Absolute 0.0 0.0 - 0.1 K/uL   Immature Granulocytes 1 %   Abs Immature Granulocytes 0.04 0.00 - 0.07 K/uL    Comment: Performed at [facility], [address], [place], [place] [address]  Comprehensive metabolic panel     Status: Abnormal   Collection Time: 04/05/23  6:20 PM   Result Value Ref Range   Sodium 139 135 - 145 mmol/L   Potassium 6.0 (H) 3.5 - 5.1 mmol/L   Chloride 99 98 - 111 mmol/L   CO2 11 (L) 22 - 32 mmol/L   Glucose, Bld 62 (L) 70 - 99 mg/dL    Comment: Glucose reference range applies only to samples taken after fasting for at least 8 hours.   BUN 57 (H) 8 - 23 mg/dL   Creatinine, Ser 1.91 (H) 0.44 - 1.00 mg/dL   Calcium 47.8 8.9 - 29.5 mg/dL   Total Protein 7.2 6.5 - 8.1 g/dL   Albumin 3.7 3.5 - 5.0 g/dL   AST 621 (H) 15 - 41 U/L   ALT 723 (H) 0 - 44 U/L   Alkaline Phosphatase 262 (H) 38 - 126 U/L   Total Bilirubin 2.7 (H) 0.3 - 1.2 mg/dL   GFR, Estimated 10 (L) >60 mL/min    Comment: (NOTE) Calculated using the CKD-EPI Creatinine Equation (2021)    Anion gap 29 (H) 5 - 15    Comment: ELECTROLYTES REPEATED TO VERIFY Performed at [facility], [address], [place], [place] [address]   Lipase, blood     Status: None   Collection Time: 04/05/23  6:20 PM  Result Value Ref Range   Lipase 28 11 - 51 U/L    Comment: Performed at [facility], [address] [place], [place] [address]  Lactic acid, plasma     Status: Abnormal   Collection Time: 04/05/23  6:20 PM  Result Value Ref Range   Lactic Acid, Venous >9.0 (HH) 0.5 - 1.9 mmol/L    Comment: CRITICAL RESULT CALLED TO, READ BACK BY AND VERIFIED WITH C SWORD,RN 2021 04/05/23 WBOND Performed at [facility], [address] [place], [place] [address]   Troponin I (High Sensitivity)     Status: Abnormal   Collection Time: 04/05/23  6:20 PM  Result Value Ref Range   Troponin I (High Sensitivity) 75 (H) <18 ng/L    Comment: (NOTE) Elevated high sensitivity troponin I (hsTnI) values and significant  changes across serial measurements may suggest ACS but many other  chronic and acute conditions are known to elevate hsTnI results.  Refer to the "Links" section for chest pain algorithms and additional  guidance. Performed at [facility], [address],  [place], [place] [address]   I-stat chem 8, ED (not at [facility], [facility] or [facility])     Status: Abnormal   Collection Time: 04/05/23  6:39 PM  Result Value Ref Range   Sodium 137 135 - 145 mmol/L   Potassium 5.8 (H) 3.5 - 5.1 mmol/L   Chloride 105 98 - 111 mmol/L   BUN 54 (H) 8 - 23 mg/dL   Creatinine, Ser 1.61 (H) 0.44 - 1.00 mg/dL   Glucose, Bld 54 (L) 70 - 99 mg/dL    Comment: Glucose reference range applies only to samples taken after fasting for at least 8 hours.   Calcium, Ion 1.12 (L) 1.15 - 1.40 mmol/L   TCO2 14 (L) 22 - 32 mmol/L   Hemoglobin 16.0 (H) 12.0 - 15.0 g/dL   HCT 09.6 (H) 04.5 - 40.9 %  I-Stat venous blood gas, ([facility] ED, [facility], [facility])     Status: Abnormal   Collection Time: 04/05/23  6:39 PM  Result Value Ref Range   pH, Ven 7.064 (LL) 7.25 - 7.43   pCO2, Ven 39.5 (L) 44 - 60 mmHg   pO2, Ven 44 32 - 45 mmHg   Bicarbonate 11.3 (L) 20.0 - 28.0 mmol/L   TCO2 12 (L) 22 - 32 mmol/L   O2 Saturation 61 %   Acid-base deficit 18.0 (H) 0.0 - 2.0 mmol/L   Sodium 136 135 - 145 mmol/L   Potassium 5.8 (H) 3.5 - 5.1 mmol/L   Calcium, Ion 1.12 (L) 1.15 - 1.40 mmol/L   HCT 46.0 36.0 - 46.0 %   Hemoglobin 15.6 (H) 12.0 - 15.0 g/dL   Sample type VENOUS    Comment NOTIFIED PHYSICIAN   CBG monitoring, ED     Status: Abnormal   Collection Time: 04/05/23  8:48 PM  Result Value Ref Range   Glucose-Capillary 149 (H) 70 - 99 mg/dL    Comment: Glucose reference range applies only to samples taken after fasting for at least 8 hours.   *Note: Due to a large number of results and/or encounters for the requested time period, some results have not been displayed. A complete set of results can be found in Results Review.    US Renal  Result Date: 04/05/2023 CLINICAL DATA:  Renal failure. EXAM: RENAL / URINARY TRACT ULTRASOUND COMPLETE COMPARISON:  None Available. FINDINGS: Right Kidney: Renal measurements: 10.2 cm x 4.2 cm x 3.6 cm = volume: 80.88 mL. Echogenicity within normal limits. No mass or  hydronephrosis visualized. Left Kidney: Renal measurements: 9.7 cm x 5.6 cm x 5.0 cm = volume: 141.77 mL. Echogenicity within normal limits. No mass or hydronephrosis visualized. Bladder: Empty and subsequently limited in evaluation. Other: Of incidental note is the presence of a right pleural effusion. IMPRESSION: 1. Unremarkable ultrasonographic appearance of the kidneys. 2. Right pleural effusion. Electronically Signed   By: Aram Candela M.D.   On: 04/05/2023 20:12   DG Chest Port 1 View  Result Date: 04/05/2023 CLINICAL DATA:  Abdominal pain, nausea and vomiting, tachypnea EXAM: PORTABLE CHEST 1 VIEW COMPARISON:  01/24/2023 FINDINGS: Single frontal view of the chest demonstrates an enlarged cardiac silhouette. Stable dual lead pacer/AICD. Increasing central vascular congestion, with bilateral perihilar airspace disease. Trace bilateral pleural effusions. No pneumothorax. IMPRESSION: 1. Constellation of findings favoring congestive heart failure and mild pulmonary edema. Superimposed infection cannot be excluded. Electronically Signed   By: Sharlet Salina M.D.   On: 04/05/2023 19:44    Assessment/PlanCarrie B Woodard is an 82 y.o. female with DM, CAD h/o MI 01/2023 (complicated course), HFpEF, PAD, OSA, A fib, CKD3 currently admitted with N/V, severe acidosis and AKI for which nephrology is consulted.   **  severe AGMA:  with pH < 7.1 giving bicarb amp + gtt.  CXR concerning for mild pulm edema -- can give additional amps of bicarb to minimize volume if pulm status worse (and loop diuretics).  Central line to aid in diagnostics.    **AKI, severe on CKD 3:Recent baseline Cr in the 1.5-1.8mg /dL range, now 4  in setting of N/V x days prior to admission + ongoing diuretic use.  Elevated LA concern for hypoperfusion.  Concern for cardiogenic shock. Presumably prerenal azotemia but poss progression to ATN.  Consider atypical HUS however recent E coli was non shiga toxin producing; doubt this as etiology.    Renal US ok.  Check urine lytes.  Currently no indications for RRT but may develop in the next 24-48h --> brought up with patient and she is not clear she would do dialysis; ongoing discussion if needed.  Follow I/Os.  Avoid nephrotoxins.    **Hyperkalemia:  K 6 - med managed; repeat 5.1.  Follow.    **N/V/abd pain:  recent ETEC - being treated with azithromycin now.  CT scan planned when more stable.    **Transaminitis:  per primary - suspected to be congestive; abd imaging pending.   **CAD s/p recent NSTEMI, A fib: eval for CHF underway, on home meds  Will follow, contact with concerns.   Tyler Pita 04/05/2023, 9:13 PM

## 2023-04-05 NOTE — ED Notes (Signed)
Ultrasound at bedside

## 2023-04-05 NOTE — ED Notes (Signed)
EDP notified of Critical Labs   Lactic Acid >9.0

## 2023-04-05 NOTE — ED Notes (Signed)
Critical Care provider at bedside for placement of Central line.

## 2023-04-05 NOTE — H&P (Signed)
NAME:  Natasha Woodard, MRN:  [id number], DOB:  20-Jul-1941, LOS: 0 ADMISSION DATE:  04/05/2023, CONSULTATION DATE:  2/24 REFERRING MD:  Knapp-ED, CHIEF COMPLAINT:  metabolic acidosis   History of Present Illness:  Natasha Woodard is an 82 y/o woman with a history of NSTEMI and cardiogenic shock with a complicated and prolonged admission earlier this year,  HFrEF, OSA, HTN, HLD who presented with recurrent abdominal symptoms with early satiety, distention, diffuse pain throughout her abdomen, nausea, and vomiting. She had a recent admission and was discharged on 04/03/23 with ETEC gastroenteritis. She has a son who has been sick at home for a few days as well, but her daughter is unsure of what symptoms he has been having. She has been taking her meds since discharge, but did not take most of them this morning due to nausea. She has noticed more swelling, but cannot give a timeline of that.   Pertinent  Medical History  Recent ETEC gastroenteritis NSTEMI, cardiogenic shock- previous prolonged admission 1/30- 02/19/23 HFrEF, AICD in place OSA Pseudoaneurysm, s/p repair & debridements R groin  Significant Hospital Events: Including procedures, antibiotic start and stop dates in addition to other pertinent events   4/24 admitted, CVC placed  Interim History / Subjective:    Objective   Blood pressure (!) 116/50, pulse 72, temperature 98 F (36.7 C), temperature source Oral, resp. rate (!) 24, height 5\' 2"  (1.575 m), weight 70.5 kg, SpO2 100 %.    FiO2 (%):  [50 %-80 %] 50 %  No intake or output data in the 24 hours ending 04/05/23 2029 Filed Weights   04/05/23 1717  Weight: 70.5 kg    Examination: General: ill appearing woman lying in bed in NAD on BiPAP HENT: Turner/AT, eyes anicteric Lungs: breathing comfortably on BiPAP, no rhonchi, wheezing, or rhales. Able to speak despite bipap. Cardiovascular: S1S2, RRR. Pacer pocket looks normal . Abdomen: distended, soft, mild TTP but no  guarding Extremities: ankle edema, no cyanosis Neuro: awake, alert, normal speech, answering questions appropriately. Derm: peeling skin  LA >9 K+ 6 Bicarb 11 BUN 57 Cr 4.29 BNP 2550, up from 1775 on 4/19 Trop 75  WBC 6.5 H/H 13.7/47.1 CXR personally reviewed> central and basilar opacities suspicious for pulmonary edema   EKG personally reviewed> paced rhythm,  Resolved Hospital Problem list     Assessment & Plan:  Severe metabolic acidosis, lactic acidosis. Concern for decompensated heart failure causing this.  Concern for acute on chronic HFrEF with cardiogenic shock -on bicarb gtt per nephrology -needs CVC for CVPs and coox -anticipate she will need diuresis, possibly inotropes -repeat echo -hold Bblocker -con't plavix, crestor  ETEC diarrhea -would favor starting antibiotics at this time due to persistent infection and critical illness; start azithromycin   History of Afib -con't eliquis -con't amiodarone  Abdominal distention, pain -eventually needs repeat CT of her abdomen with oral contrast, but need to stabilize breathing first  Pre-DM; hypoglycemic today -monitor accuchecks, d50w PRN  AKI on CKD IV, worry about cardiorenal syndrome vs HUS-type syndrome from ETEC (although she did not have the shiga-toxin producing variety) Hyperkalemia -strict I/O -bicarb infusion per nephrology; will follow up when we have more information after CVC -repeat BMP tonight -con't holding spiro & ARB  Hypocalcemia -repleted  Elevated transaminases, hyperbilirubinemia- unclear if related to heart or from diarrhea -monitor  Constipation -dulcolax  Family updated at bedside. See separate ipal note. DNR; family not in agreement with patient.  Best Practice (right click and "  Reselect all SmartList Selections" daily)   Diet/type: NPO w/ oral meds DVT prophylaxis: DOAC GI prophylaxis: N/A Lines: Central line Foley:  Yes, and it is still needed Code Status:   limited Last date of multidisciplinary goals of care discussion [4/24 in ED with patient and family]  Labs   CBC: Recent Labs  Lab 03/30/23 1845 04/01/23 0359 04/02/23 0154 04/03/23 0241 04/05/23 1820 04/05/23 1839  WBC 4.6 4.8 4.5 5.5 6.5  --   NEUTROABS 2.6  --   --   --  5.4  --   HGB 11.6* 11.9* 11.7* 11.7* 13.7 15.6*  16.0*  HCT 37.3 37.9 36.0 38.5 47.1* 46.0  47.0*  MCV 96.1 95.2 93.3 96.5 100.6*  --   PLT 242 246 228 234 251  --     Basic Metabolic Panel: Recent Labs  Lab 03/31/23 0706 04/01/23 0359 04/02/23 0154 04/03/23 0241 04/05/23 1820 04/05/23 1839  NA 135 134* 138 139 139 136  137  K 4.1 4.2 4.6 4.8 6.0* 5.8*  5.8*  CL 100 101 102 106 99 105  CO2 21* 11*  --   GLUCOSE 114* 111* 135* 105* 62* 54*  BUN 43* 41* 39* 39* 57* 54*  CREATININE 2.96* 2.76* 2.85* 2.48* 4.29* 4.40*  CALCIUM 9.2 8.9 9.2 9.1 10.2  --   MG  --   --  1.9  --   --   --    GFR: Estimated Creatinine Clearance: 9.1 mL/min (A) (by C-G formula based on SCr of 4.4 mg/dL (H)). Recent Labs  Lab 03/31/23 0706 04/01/23 0359 04/02/23 0154 04/03/23 0241 04/05/23 1820  PROCALCITON 0.37  --   --   --   --   WBC  --  4.8 4.5 5.5 6.5  LATICACIDVEN  --   --   --   --  >9.0*    Liver Function Tests: Recent Labs  Lab 03/30/23 1845 04/01/23 0359 04/05/23 1820  AST 54* 41 804*  ALT 64* 61* 723*  ALKPHOS 212* 204* 262*  BILITOT 0.5 0.7 2.7*  PROT 6.3* 6.3* 7.2  ALBUMIN 3.3* 3.2* 3.7   Recent Labs  Lab 03/30/23 1845 04/05/23 1820  LIPASE 33 28   No results for input(s): "AMMONIA" in the last 168 hours.  ABG    Component Value Date/Time   PHART 7.361 01/10/2023 1942   PCO2ART 43.9 01/10/2023 1942   PO2ART 87 01/10/2023 1942   HCO3 11.3 (L) 04/05/2023 1839   TCO2 14 (L) 04/05/2023 1839   TCO2 12 (L) 04/05/2023 1839   ACIDBASEDEF 18.0 (H) 04/05/2023 1839   O2SAT 61 04/05/2023 1839     Coagulation Profile: No results for input(s): "INR", "PROTIME" in the last  168 hours.  Cardiac Enzymes: No results for input(s): "CKTOTAL", "CKMB", "CKMBINDEX", "TROPONINI" in the last 168 hours.  HbA1C: Hgb A1c MFr Bld  Date/Time Value Ref Range Status  03/31/2023 09:30 AM 5.6 4.8 - 5.6 % Final    Comment:    (NOTE) Pre diabetes:          5.7%-6.4%  Diabetes:              >6.4%  Glycemic control for   <7.0% adults with diabetes   03/13/2023 03:18 PM 5.2 4.6 - 6.5 % Final    Comment:    Glycemic Control Guidelines for People with Diabetes:Non Diabetic:  <6%Goal of Therapy: <7%Additional Action Suggested:  >8%     CBG: Recent Labs  Lab 04/02/23 0740  04/02/23 1141 04/02/23 1602 04/02/23 2030 04/03/23 0723  GLUCAP 153* 143* 123* 162* 89    Review of Systems:   Review of Systems  Constitutional:  Positive for malaise/fatigue.  HENT: Negative.    Respiratory:  Positive for shortness of breath.   Cardiovascular:  Positive for leg swelling.  Gastrointestinal:  Positive for abdominal pain, constipation, nausea and vomiting.     Past Medical History:  She,  has a past medical history of AICD (automatic cardioverter/defibrillator) present, Calcific tendonitis (2008), Cardiomyopathy (06/2017), Chronic combined systolic and diastolic CHF, NYHA class 2 and ACA/AHA stage C, Coronary artery disease, non-occlusive, CTS (carpal tunnel syndrome), Diabetes mellitus, Dysrhythmia, Frequent unifocal PVCs (01/2018), Hiatal hernia, Hyperlipidemia, Hypertension, Ichthyosis congenita, Intermittent claudication of both lower extremities due to atherosclerosis (08/22/2021), NSVD (normal spontaneous vaginal delivery) (1971 & 1972), Obesity, Plantar fasciitis, Presence of permanent cardiac pacemaker, Pulmonary nodule, Sleep apnea, and Wears glasses.   Surgical History:   Past Surgical History:  Procedure Laterality Date   ABDOMINAL HYSTERECTOMY  1975   TAH-BSO   ABSCESS DRAINAGE  1972   right breast   APPLICATION OF WOUND VAC Right 01/15/2023   Procedure:  APPLICATION OF INCISIONAL WOUND VAC;  Surgeon: Victorino Sparrow, MD;  Location: [facility] OR;  Service: Vascular;  Laterality: Right;   APPLICATION OF WOUND VAC Right 01/26/2023   Procedure: APPLICATION OF WOUND VAC RIGHT GROIN WASHOUT AND SARTORIUS MUSCLE FLAP;  Surgeon: Victorino Sparrow, MD;  Location: [facility] OR;  Service: Vascular;  Laterality: Right;   ARTERY REPAIR Right 01/15/2023   Procedure: RIGHT PROFUNDA ARTERY REPAIR WITH 2000G MYRIAD MORCELLS;  Surgeon: Victorino Sparrow, MD;  Location: [facility] OR;  Service: Vascular;  Laterality: Right;   BREAST BIOPSY Left 01/14/2009   Stereo- Benign   CARDIAC MRI  01/2018   Difficult to interpret 2/2 PVCs. Normal LV size - EF ~27% with diffuse HK. Mild RV dilation - normal fxn. -- NO MYOCARDIAL LGI => no definitive evidence of prior MI, Infiltrative Dz or Myocarditis -- suspect NICM, possibly related to PVCs.   CARPAL TUNNEL RELEASE Right 07/02/2014   Procedure: RIGHT CARPAL TUNNEL RELEASE;  Surgeon: Nicki Reaper, MD;  Location: [facility];  Service: Orthopedics;  Laterality: Right;   CARPAL TUNNEL RELEASE Left 06/11/2015   Procedure: LEFT CARPAL TUNNEL RELEASE;  Surgeon: Cindee Salt, MD;  Location: [facility];  Service: Orthopedics;  Laterality: Left;  REGIONAL/FAB   COLONOSCOPY     corn removal  1968   right   CORONARY STENT INTERVENTION N/A 01/10/2023   Procedure: CORONARY STENT INTERVENTION;  Surgeon: Lennette Bihari, MD;  Location: [facility] INVASIVE CV LAB;  Service: Cardiovascular;  Laterality: N/A;   GROIN DEBRIDEMENT Right 02/06/2023   Procedure: RIGHT GROIN DEBRIDEMENT WITH WOUND VAC CHANGE;  Surgeon: Victorino Sparrow, MD;  Location: [facility] OR;  Service: Vascular;  Laterality: Right;   HEMATOMA EVACUATION Right 01/15/2023   Procedure: EVACUATION HEMATOMA RIGHT GROIN;  Surgeon: Victorino Sparrow, MD;  Location: [facility] OR;  Service: Vascular;  Laterality: Right;   Holter Monitor  06/2017   ~17,000 PVC beats - majority were singlets, some couplets.  44 brief 3-7 beat runs of NSVT. Also noted were less frequent PACs with 11 runs (longest 15 beats)   IABP INSERTION Right 01/10/2023   Procedure: IABP Insertion;  Surgeon: Lennette Bihari, MD;  Location: [facility] INVASIVE CV LAB;  Service: Cardiovascular;  Laterality: Right;   ICD IMPLANT N/A 01/23/2023   Procedure: ICD IMPLANT;  Surgeon: Marinus Maw, MD;  Location: [facility] INVASIVE CV LAB;  Service: Cardiovascular;  Laterality: N/A;   INCISION AND DRAINAGE OF WOUND Right 01/31/2023   Procedure: IRRIGATION AND DEBRIDEMENT RIGHT GROIN WOUND AND WOUND VAC CHANGE;  Surgeon: Victorino Sparrow, MD;  Location: [facility] OR;  Service: Vascular;  Laterality: Right;   OPEN REDUCTION INTERNAL FIXATION (ORIF) DISTAL RADIAL FRACTURE Left 07/21/2017   Procedure: OPEN REDUCTION INTERNAL FIXATION (ORIF) LEFT DISTAL RADIAL FRACTURE;  Surgeon: Sheral Apley, MD;  Location: [facility] OR;  Service: Orthopedics;  Laterality: Left;   RIGHT/LEFT HEART CATH AND CORONARY ANGIOGRAPHY N/A 06/30/2017   Procedure: Right/Left Heart Cath and Coronary Angiography;  Surgeon: Marykay Lex, MD;  Location: [facility] INVASIVE CV LAB:  pRCA 55%, pCx 40%, OM1 45%, mCx 50%, D2 50% - LVEF 25-35%. Moderately elevated LVEDP(26 mmHg with PCWP 16 mmHg).  FICK CO/CI: 4.47/2.48. PA pressures 47/14 mmHg with a mean of 27 mm.   RIGHT/LEFT HEART CATH AND CORONARY ANGIOGRAPHY N/A 01/10/2023   Procedure: RIGHT/LEFT HEART CATH AND CORONARY ANGIOGRAPHY;  Surgeon: Lennette Bihari, MD;  Location: [facility] INVASIVE CV LAB;  Service: Cardiovascular;  Laterality: N/A;   ROTATOR CUFF REPAIR     left shoulder   TEMPORARY PACEMAKER N/A 01/19/2023   Procedure: TEMPORARY PACEMAKER;  Surgeon: Dolores Patty, MD;  Location: [facility] INVASIVE CV LAB;  Service: Cardiovascular;  Laterality: N/A;   TRANSTHORACIC ECHOCARDIOGRAM  06/2017   EF 25 and 30%. GR 1 DD. Mild diastolic dysfunction with elevated LVEDP. Mild valvular disease.   TRANSTHORACIC ECHOCARDIOGRAM  11'18, 6/'19    a) EF remains  30-35%.  Diffuse hypokinesis noted.  Severe LA dilation; b) Improved EF 35-40%.  Diffuse HK.  GR 1 DD.  Moderate TR.  Mild RV dilation.   TRANSTHORACIC ECHOCARDIOGRAM  01/28/2022   Follow-up echo: EF 50 to 55%.  No RWMA.  GRII DD.  Severe LA dilation.  Mild RA dilation with normal PAP and RAP.  Mild to moderate TR (no significant change noted)   Zio patch  08/2022   3-day: Currently NSR (HR range 47-91 with average 64 bpm) 1 F AVB with IVCD/BBB.  Rare isolated PACs (<1%).  Frequent PVCs (6.1% with rare couplets and triplets.  Also bigeminy.  2 atrial runs 8 beats 5 2 seconds.  No sustained arrhythmias.   ZIO Patch 14 d Event Monitor  10/2018   Results as below < 1% PVC.  (Notably improved after starting amiodarone)     Social History:   reports that she quit smoking about 29 years ago. Her smoking use included cigarettes. She has never been exposed to tobacco smoke. She has never used smokeless tobacco. She reports that she does not drink alcohol and does not use drugs.   Family History:  Her family history includes Cancer in her brother; Diabetes in her father and mother; Heart disease in her brother, father, and mother; Hypertension in an other family member. There is no history of Colon cancer or Breast cancer.   Allergies Allergies  Allergen Reactions   Ezetimibe-Simvastatin Other (See Comments)    REACTION: Muscle aches (side effect)   Lipitor [Atorvastatin] Other (See Comments)    Leg weakness    Claritin [Loratadine]     Possible cause of nightmares.     Spironolactone     Caution re: elevated potassium/creatinine   Tamiflu [Oseltamivir Phosphate] Other (See Comments)    nightmares   Pravastatin Sodium Other (See Comments)    REACTION: Muscle aches (side  effect)   Sulfonamide Derivatives Nausea And Vomiting     Home Medications  Prior to Admission medications   Medication Sig Start Date End Date Taking? Authorizing Provider  acetaminophen (TYLENOL) 325 MG tablet Take 1-2  tablets (325-650 mg total) by mouth every 6 (six) hours as needed. 02/28/23   Setzer, Lynnell Jude, PA-C  amiodarone (PACERONE) 200 MG tablet Take 1 tablet (200 mg total) by mouth daily. 04/03/23 07/02/23  Pokhrel, Rebekah Chesterfield, MD  apixaban (ELIQUIS) 2.5 MG TABS tablet Take 1 tablet (2.5 mg total) by mouth 2 (two) times daily. 04/03/23 04/02/24  Pokhrel, Rebekah Chesterfield, MD  clopidogrel (PLAVIX) 75 MG tablet Take 1 tablet (75 mg total) by mouth daily. 04/03/23 04/02/24  Pokhrel, Rebekah Chesterfield, MD  glucose blood (ACCU-CHEK AVIVA PLUS) test strip USE TO TEST BLOOD SUGAR ONCE DAILY 03/23/23   [name], MD  melatonin 5 MG TABS Take 5 mg by mouth at bedtime as needed.    [provider]  Multiple Vitamin (MULTIVITAMIN WITH MINERALS) TABS tablet Take 1 tablet by mouth daily. 03/01/23   Setzer, Lynnell Jude, PA-C  ondansetron (ZOFRAN-ODT) 4 MG disintegrating tablet Take 1 tablet (4 mg total) by mouth every 8 (eight) hours as needed for nausea or vomiting. 04/05/23   [name], MD  rosuvastatin (CRESTOR) 10 MG tablet Take 1 tablet (10 mg total) by mouth daily. TAKE 1 TABLET(10 MG) BY MOUTH DAILY Strength: 10 mg 02/28/23   Setzer, Lynnell Jude, PA-C  torsemide (DEMADEX) 20 MG tablet Take 1 tablet (20 mg total) by mouth daily. 04/05/23 05/05/23  Pokhrel, Rebekah Chesterfield, MD  traMADol (ULTRAM) 50 MG tablet Take 1 tablet (50 mg total) by mouth 2 (two) times daily. 02/28/23   Setzer, Lynnell Jude, PA-C     Critical care time: 60 min.      Steffanie Dunn, DO 04/05/23 10:00 PM [facility]  For contact information, see Amion. If no response to pager, please call PCCM consult pager. After hours, 7PM- 7AM, please call Elink.

## 2023-04-05 NOTE — Progress Notes (Signed)
Care Management & Coordination Services Pharmacy Note  04/05/2023 Name:  Natasha Woodard MRN:  [id number] DOB:  09-Dec-1941  Summary: F/U visit -Reviewed recent hospitalization for gastroenteritis/AKI; pt reports she is vomiting again at home (yesterday and this morning); she has not been able to keep food or medication down (including ondansetron); she may benefit from ODT version of ondansetron so that it can be absorbed even while vomiting -Discussed most important medications to try to take are clopidogrel and Eliquis; if she has to skip statin, amiodarone for a day or 2 this should not lead to serious consequences -Per discharge summary, the plan was to restart torsemide today or tomorrow  Recommendations/Changes made from today's visit: -Ordered ondansetron ODT 4 mg #20 to replace regular ondansetron tablets -Encouraged fluid intake - water, Pedialyte and/or gatorade to replace losses from vomiting; if SOB or significant abdominal pain develops, go to ED -Given continued vomiting/risk for dehydration, reasonable to continue to hold torsemide until appt with PCP 4/25  Follow up plan: -Pharmacist follow up televisit scheduled for 1 month -PCP appt 04/06/23; Cardiology appt 04/07/23    Subjective: Natasha Woodard is an 82 y.o. year old female who is a primary patient of Para March, Dwana Curd, MD.  The care coordination team was consulted for assistance with disease management and care coordination needs.    Engaged with patient by telephone for follow up visit.  Recent office visits: 03/28/23 Dr Para March OV: vomiting x 4 days. Stay off jardiance and metformin. GFR 20 - stop losartan and spironolactone.   03/13/23 Dr Para March OV: f/u - A1c 5.2. GFR 27. Stop metformin and Jardiance. Recheck renal panel 1 week.  Recent consult visits: 03/27/23 Dr Shearon Stalls (PMR): debility. Rx zofran. Drink 6-8 cups of water per day. HR 35.   03/24/23 PA Rhyne (Vascular): wound check  03/14/23 Dr Wynona Neat  (Pulmonary): new pt - OSA. Ordered split night study.  03/09/23 PA Eveland (Vascular): surgical wound f/u - VAC removed. Switch to dry dressing.  Hospital visits: 03/30/23 - 04/03/23 Admission [facility]): Acute gastroenteritis, viral. AKI improved w/ fluids. Hold torsemide x 2 days before re-starting.  01/10/23 - 02/19/23 Admission [facility]): NSTEMI - ICU stay. Required IABP. R femoral pseudoaneurysm repair, suffered VT arrest, ICD placed. Transferred to CIR 3/10 - 3/19. Stop: carvedilol, aspirin, furosemide, Entresto. Restart Jardiance when infection resolves.   Objective:  Lab Results  Component Value Date   CREATININE 2.48 (H) 04/03/2023   BUN 39 (H) 04/03/2023   GFR 20.58 (L) 03/28/2023   EGFR 39 (L) 07/07/2022   GFRNONAA 19 (L) 04/03/2023   GFRAA 49 (L) 10/09/2020   NA 139 04/03/2023   K 4.8 04/03/2023   CALCIUM 9.1 04/03/2023   CO2 24 04/03/2023   GLUCOSE 105 (H) 04/03/2023    Lab Results  Component Value Date/Time   HGBA1C 5.6 03/31/2023 09:30 AM   HGBA1C 5.2 03/13/2023 03:18 PM   GFR 20.58 (L) 03/28/2023 09:55 AM   GFR 27.55 (L) 03/13/2023 03:18 PM    Last diabetic Eye exam:  Lab Results  Component Value Date/Time   HMDIABEYEEXA No Retinopathy 10/18/2022 12:00 AM    Last diabetic Foot exam:  Lab Results  Component Value Date/Time   HMDIABFOOTEX yes 01/25/2011 12:00 AM     Lab Results  Component Value Date   CHOL 87 01/11/2023   HDL 33 (L) 01/11/2023   LDLCALC 42 01/11/2023   LDLDIRECT 53 08/10/2009   TRIG 59 01/11/2023   CHOLHDL 2.6 01/11/2023  Latest Ref Rng & Units 04/01/2023    3:59 AM 03/30/2023    6:45 PM 03/28/2023    9:55 AM  Hepatic Function  Total Protein 6.5 - 8.1 g/dL 6.3  6.3  6.8   Albumin 3.5 - 5.0 g/dL 3.2  3.3  4.0   AST 15 - 41 U/L 41  54  23   ALT 0 - 44 U/L 61  64  28   Alk Phosphatase 38 - 126 U/L 204  212  131   Total Bilirubin 0.3 - 1.2 mg/dL 0.7  0.5  0.6     Lab Results  Component Value Date/Time   TSH 1.23 03/19/2021 11:21 AM    TSH 2.148 04/17/2020 03:08 PM   TSH 1.87 11/12/2019 09:21 AM   FREET4 1.32 (H) 04/17/2020 03:08 PM   FREET4 1.28 10/08/2018 11:14 AM       Latest Ref Rng & Units 04/03/2023    2:41 AM 04/02/2023    1:54 AM 04/01/2023    3:59 AM  CBC  WBC 4.0 - 10.5 K/uL 5.5  4.5  4.8   Hemoglobin 12.0 - 15.0 g/dL 16.1  09.6  04.5   Hematocrit 36.0 - 46.0 % 38.5  36.0  37.9   Platelets 150 - 400 K/uL 234  228  246     Lab Results  Component Value Date/Time   VD25OH 60 07/23/2013 10:13 AM   VITAMINB12 298 01/13/2023 08:57 AM    Clinical ASCVD: Yes  The ASCVD Risk score (Arnett DK, et al., 2019) failed to calculate for the following reasons:   The 2019 ASCVD risk score is only valid for ages 52 to 32   The patient has a prior MI or stroke diagnosis    CHA2DS2/VAS Stroke Risk Points  Current as of 4 minutes ago     7 >= 2 Points: High Risk     Points Metrics  1 Has Congestive Heart Failure:  Yes    Current as of 4 minutes ago  1 Has Vascular Disease:  Yes    Current as of 4 minutes ago  1 Has Hypertension:  Yes    Current as of 4 minutes ago  2 Age:  82    Current as of 4 minutes ago  1 Has Diabetes:  Yes    Current as of 4 minutes ago  0 Had Stroke:  No  Had TIA:  No  Had Thromboembolism:  No    Current as of 4 minutes ago  1 Female:  Yes    Current as of 4 minutes ago       03/28/2023    8:58 AM 03/27/2023   10:57 AM 03/13/2023    2:44 PM  Depression screen PHQ 2/9  Decreased Interest 1 1 0  Down, Depressed, Hopeless 1 1 0  PHQ - 2 Score 2 2 0  Altered sleeping 1 2 0  Tired, decreased energy 3 1 0  Change in appetite 3 1 0  Feeling bad or failure about yourself  0 0 0  Trouble concentrating 0 0 0  Moving slowly or fidgety/restless 0 0 0  Suicidal thoughts 0 0 0  PHQ-9 Score 9 6 0  Difficult doing work/chores Not difficult at all Somewhat difficult Not difficult at all       03/28/2023    8:58 AM  GAD 7 : Generalized Anxiety Score  Nervous, Anxious, on Edge 1   Control/stop worrying 0  Worry too much - different  things 1  Trouble relaxing 0  Restless 0  Easily annoyed or irritable 0  Afraid - awful might happen 0  Total GAD 7 Score 2  Anxiety Difficulty Not difficult at all     Social History   Tobacco Use  Smoking Status Former   Years: 28   Types: Cigarettes   Quit date: 12/12/1993   Years since quitting: 29.3   Passive exposure: Never  Smokeless Tobacco Never   BP Readings from Last 3 Encounters:  04/03/23 127/60  03/28/23 120/82  03/27/23 134/72   Pulse Readings from Last 3 Encounters:  04/03/23 70  03/28/23 69  03/27/23 (!) 37   Wt Readings from Last 3 Encounters:  03/31/23 155 lb 6.8 oz (70.5 kg)  03/28/23 151 lb (68.5 kg)  03/27/23 149 lb 12.8 oz (67.9 kg)   BMI Readings from Last 3 Encounters:  03/31/23 28.43 kg/m  03/28/23 27.62 kg/m  03/27/23 27.40 kg/m    Allergies  Allergen Reactions   Ezetimibe-Simvastatin Other (See Comments)    REACTION: Muscle aches (side effect)   Lipitor [Atorvastatin] Other (See Comments)    Leg weakness    Claritin [Loratadine]     Possible cause of nightmares.     Spironolactone     Caution re: elevated potassium/creatinine   Tamiflu [Oseltamivir Phosphate] Other (See Comments)    nightmares   Pravastatin Sodium Other (See Comments)    REACTION: Muscle aches (side effect)   Sulfonamide Derivatives Nausea And Vomiting    Medications Reviewed Today     Reviewed by Luella Cook, RN (Case Manager) on 04/04/23 at 1702  Med List Status: <None>   Medication Order Taking? Sig Documenting Provider Last Dose Status Informant  acetaminophen (TYLENOL) 325 MG tablet [id number] No Take 1-2 tablets (325-650 mg total) by mouth every 6 (six) hours as needed. Carlos Levering 03/30/2023 Active Self, Pharmacy Records  amiodarone (PACERONE) 200 MG tablet [id number]  Take 1 tablet (200 mg total) by mouth daily. Pokhrel, Laxman, MD  Active   apixaban (ELIQUIS) 2.5 MG TABS tablet  [id number]  Take 1 tablet (2.5 mg total) by mouth 2 (two) times daily. Pokhrel, Laxman, MD  Active   clopidogrel (PLAVIX) 75 MG tablet [id number]  Take 1 tablet (75 mg total) by mouth daily. Pokhrel, Laxman, MD  Active   glucose blood (ACCU-CHEK AVIVA PLUS) test strip [id number] No USE TO TEST BLOOD SUGAR ONCE DAILY [name], MD 03/30/2023 Active Self, Pharmacy Records  melatonin 5 MG TABS [id number] No Take 5 mg by mouth at bedtime as needed. [provider] 03/30/2023 Active Self, Pharmacy Records  Multiple Vitamin (MULTIVITAMIN WITH MINERALS) TABS tablet [id number] No Take 1 tablet by mouth daily. Setzer, Lynnell Jude, PA-C Past Week Active Self, Pharmacy Records  ondansetron [facility]) 4 MG tablet [id number] No Take 1 tablet (4 mg total) by mouth every 8 (eight) hours as needed for nausea or vomiting. Angelina Sheriff, DO 03/30/2023 Active Self, Pharmacy Records  rosuvastatin (CRESTOR) 10 MG tablet [id number] No Take 1 tablet (10 mg total) by mouth daily. TAKE 1 TABLET(10 MG) BY MOUTH DAILY Strength: 10 mg Carlos Levering 03/30/2023 Active Self, Pharmacy Records  torsemide (DEMADEX) 20 MG tablet [id number]  Take 1 tablet (20 mg total) by mouth daily. Pokhrel, Laxman, MD  Active   traMADol (ULTRAM) 50 MG tablet [id number] No Take 1 tablet (50 mg total) by mouth 2 (two) times daily. Carlos Levering 03/30/2023 Active Self, Pharmacy Records  SDOH:  (Social Determinants of Health) assessments and interventions performed: No SDOH Interventions    Flowsheet Row Telephone from 04/04/2023 in [facility] Coord[facility] ED to Hosp-Admission (Discharged) from 03/30/2023 in [facility] Office Visit from 03/28/2023 in [facility] Coordination from 03/22/2023 in [facility] Coord[facility] ED to Hosp-Admission (Discharged) from 01/10/2023 in [facility] 4E CV SURGICAL PROGRESSIVE CARE  Chronic Care Management from 05/04/2022 in [facility] [place]  SDOH Interventions        Food Insecurity Interventions Intervention Not Indicated -- -- Intervention Not Indicated Intervention Not Indicated Intervention Not Indicated  Housing Interventions Intervention Not Indicated -- -- Intervention Not Indicated Intervention Not Indicated --  Transportation Interventions Intervention Not Indicated Intervention Not Indicated, Inpatient TOC, Patient Resources (Friends/Family) -- Intervention Not Indicated Intervention Not Indicated --  Utilities Interventions -- -- -- -- Intervention Not Indicated --  Depression Interventions/Treatment  -- -- Counseling -- -- --  Financial Strain Interventions -- -- -- -- -- Other (Comment)  [assistance approved for Jardiance, Entresto]       Medication Assistance: None required.  Patient affirms current coverage meets needs.  Medication Access: Within the past 30 days, how often has patient missed a dose of medication? 0 Is a pillbox or other method used to improve adherence? No  Factors that may affect medication adherence? no barriers identified Are meds synced by current pharmacy? No  Are meds delivered by current pharmacy? No  Does patient experience delays in picking up medications due to transportation concerns? No   [facility] Services Reviewed: Is patient disadvantaged to use [facility]?: No  Current Rx insurance plan: [facility] Name and location of Current pharmacy:  [id number] [place], [place] - [address] AT [address] [place] [address] Phone: [phone] Fax: [phone]  [facility] [place], [place] - [address] [place] [address] Phone: [phone] Fax: [phone]  [facility] [address] [place] [address] Phone: [phone] Fax: [phone]  [facility] services  reviewed with patient today?: No  Patient requests to transfer care to [facility]?: No  Reason patient declined to change pharmacies: Not mentioned at this visit  Compliance/Adherence/Medication fill history: Care Gaps: UACR  Star-Rating Drugs: Rosuvastatin - PDC 93%   Assessment/Plan  Hyperlipidemia / PAD (LDL goal < 70) -Controlled-  LDL 42 (12/2022) at goal -New NSTEMI 12/2022, leading to hospitalization w/ VT arrest, ICD placed -Current treatment: Rosuvastatin 10 mg daily - Appropriate, Effective, Safe, Accessible Clopidogrel 75 mg daily -Previously tried/failed meds: lovastatin (leg cramps) -[facility] -Educated on Cholesterol goals; Benefits of statin for ASCVD risk reduction; Strategies to manage statin-induced myalgias; benefits of aspirin in PAD -Counseled to continued daily exercise routine -Recommend to continue current medication  Diabetes (A1c goal <7%) -Controlled - A1c 5.6% (03/2023) at goal -Current medications: Testing supplies -Previously tried/failed meds: metformin, Jardiance (GFR < 30) -Educated on A1c and blood sugar goals; Complications of diabetes including kidney damage, retinal damage, and cardiovascular disease; benefits of metformin and Jardiance for DM and CV protection -Recommend to continue current medication  Heart Failure / Hypertension (Goal: BP < 140/90) -Query controlled -Last ejection fraction: 30-35% (Date: 01/18/23) -HF type: HFrEF (EF < 40%); NYHA Class: II-III -Current treatment: Torsemide 20 mg daily - on hold -  Medications previously tried: spironolactone, losartan, Jardiance (GFR < 30) -Educated on Benefits of medications for managing symptoms and prolonging life; Importance of blood pressure control; proper BP monitoring technique -Reviewed importance of daily weights and discussed calling cardiology with wt gain > 3lbs overnight or 5+ lbs in a week -Recommend to continue current  medication  Atrial Fibrillation (Goal: prevent stroke and major bleeding) -Controlled -CHADSVASC: 7; Afib Dx in s/o hospitalization for NSTEMI, VT arrest 12/2022 -Current treatment: Amiodarone 200 mg daily - Appropriate, Effective, Safe, Accessible Eliquis 2.5 mg BID -Appropriate, Effective, Safe, Accessible -Counseled on increased risk of stroke due to Afib and benefits of anticoagulation for stroke prevention; avoidance of NSAIDs due to increased bleeding risk with anticoagulants; -Recommended to continue current medication  Vomiting  -Pt reports actively vomiting, she is not able to keep down food or medications; she tried to take ondansetron yesterday but vomited right after so she is afraid to take it this morning; discussed ondansetron likely did not take effect if she vomited soon after taking it - she may benefit from ODT version so that the drug can be absorbed even if she is vomiting frequently -Ordered ondansetron 4 mg ODT #20 to replace regular tablets in s/o active vomiting  ILD / chronic DOE (Goal: manage symptoms) -Not ideally controlled -Current treatment  None -Medications previously tried: Trelegy, Combivent -Counseled on Differences between maintenance and rescue inhalers - discussed pulmonologist had given Trelegy to see if it would help her chronic DOE, if it does not help she can stop it -Advised trial of Trelegy, monitor breathing symptoms closely for improvement  Health Maintenance -Vaccine gaps: TDAP -discussed benefits/risks of TDAP booster, pt is low risk for TD and would like to forego 10-year booster  Al Corpus, PharmD, Patsy [place], CPP Clinical Pharmacist Practitioner [facility] [phone]

## 2023-04-05 NOTE — ED Provider Notes (Signed)
[facility] AT [facility] Provider Note   CSN: [id number] Arrival date & time:        History {Add pertinent medical, surgical, social history, OB history to HPI:1} Chief Complaint  Patient presents with   Abdominal Pain    Natasha Woodard is a 82 y.o. female.   Abdominal Pain    Patient has a history of hypertension, hyperlipidemia, diabetes, hiatal hernia, cardiomyopathy, coronary artery disease, CHF, claudication who presents to the ED for evaluation of abdominal pain shortness of breath nausea vomiting.  Patient was recently admitted to the hospital on April 18.  She was discharged on April 22.  Time the patient was admitted for acute kidney injury.  During her evaluation the patient had CT scans and ultrasounds.  Patient was treatable for intractable nausea and vomiting and acute gastroenteritis related to enterotoxigenic E. coli.  Patient's diarrhea resolved.  Her creatinine slowly improved from 3.0-2.4.  Patient was instructed to hold her torsemide.  Patient had mildly elevated troponins were felt to be related to demand ischemia.  Patient states she was feeling better at the time of discharge but the following day she started feeling sick again.  Patient's now states she is having severe difficulty with her breathing.  She is having pain throughout her entire abdomen.  She has not been able to eat and drink.  She states whenever she tries to do so she vomits.  Home Medications Prior to Admission medications   Medication Sig Start Date End Date Taking? Authorizing Provider  acetaminophen (TYLENOL) 325 MG tablet Take 1-2 tablets (325-650 mg total) by mouth every 6 (six) hours as needed. 02/28/23   Setzer, Lynnell Jude, PA-C  amiodarone (PACERONE) 200 MG tablet Take 1 tablet (200 mg total) by mouth daily. 04/03/23 07/02/23  Pokhrel, Rebekah Chesterfield, MD  apixaban (ELIQUIS) 2.5 MG TABS tablet Take 1 tablet (2.5 mg total) by mouth 2 (two) times daily. 04/03/23 04/02/24   Pokhrel, Rebekah Chesterfield, MD  clopidogrel (PLAVIX) 75 MG tablet Take 1 tablet (75 mg total) by mouth daily. 04/03/23 04/02/24  Pokhrel, Rebekah Chesterfield, MD  glucose blood (ACCU-CHEK AVIVA PLUS) test strip USE TO TEST BLOOD SUGAR ONCE DAILY 03/23/23   [name], MD  melatonin 5 MG TABS Take 5 mg by mouth at bedtime as needed.    [provider]  Multiple Vitamin (MULTIVITAMIN WITH MINERALS) TABS tablet Take 1 tablet by mouth daily. 03/01/23   Setzer, Lynnell Jude, PA-C  ondansetron (ZOFRAN-ODT) 4 MG disintegrating tablet Take 1 tablet (4 mg total) by mouth every 8 (eight) hours as needed for nausea or vomiting. 04/05/23   [name], MD  rosuvastatin (CRESTOR) 10 MG tablet Take 1 tablet (10 mg total) by mouth daily. TAKE 1 TABLET(10 MG) BY MOUTH DAILY Strength: 10 mg 02/28/23   Setzer, Lynnell Jude, PA-C  torsemide (DEMADEX) 20 MG tablet Take 1 tablet (20 mg total) by mouth daily. 04/05/23 05/05/23  Pokhrel, Rebekah Chesterfield, MD  traMADol (ULTRAM) 50 MG tablet Take 1 tablet (50 mg total) by mouth 2 (two) times daily. 02/28/23   Setzer, Lynnell Jude, PA-C      Allergies    Ezetimibe-simvastatin, Lipitor [atorvastatin], Claritin [loratadine], Spironolactone, Tamiflu [oseltamivir phosphate], Pravastatin sodium, and Sulfonamide derivatives    Review of Systems   Review of Systems  Gastrointestinal:  Positive for abdominal pain.    Physical Exam Updated Vital Signs BP 122/69   Pulse 70   Temp 98 F (36.7 C) (Oral)   Resp Marland Kitchen)  23   Ht 1.575 m (5\' 2" )   Wt 70.5 kg   SpO2 100%   BMI 28.43 kg/m  Physical Exam Vitals and nursing note reviewed.  Constitutional:      General: She is in acute distress.     Appearance: She is ill-appearing.  HENT:     Head: Normocephalic and atraumatic.     Right Ear: External ear normal.     Left Ear: External ear normal.  Eyes:     General: No scleral icterus.       Right eye: No discharge.        Left eye: No discharge.     Conjunctiva/sclera: Conjunctivae normal.  Neck:      Trachea: No tracheal deviation.  Cardiovascular:     Rate and Rhythm: Normal rate and regular rhythm.  Pulmonary:     Effort: Tachypnea and respiratory distress present.     Breath sounds: No stridor. Rales present. No wheezing.  Abdominal:     General: Bowel sounds are normal. There is no distension.     Palpations: Abdomen is soft.     Tenderness: There is generalized abdominal tenderness. There is no guarding or rebound.  Musculoskeletal:        General: No tenderness or deformity.     Cervical back: Neck supple.     Right lower leg: Edema present.     Left lower leg: Edema present.  Skin:    General: Skin is warm and dry.     Findings: No rash.  Neurological:     General: No focal deficit present.     Mental Status: She is alert.     Cranial Nerves: No cranial nerve deficit, dysarthria or facial asymmetry.     Sensory: No sensory deficit.     Motor: No abnormal muscle tone or seizure activity.     Coordination: Coordination normal.  Psychiatric:        Mood and Affect: Mood normal.     ED Results / Procedures / Treatments   Labs (all labs ordered are listed, but only abnormal results are displayed) Labs Reviewed  BRAIN NATRIURETIC PEPTIDE - Abnormal; Notable for the following components:      Result Value   B Natriuretic Peptide 2,550.1 (*)    All other components within normal limits  CBC WITH DIFFERENTIAL/PLATELET - Abnormal; Notable for the following components:   HCT 47.1 (*)    MCV 100.6 (*)    MCHC 29.1 (*)    RDW 15.8 (*)    nRBC 0.5 (*)    Lymphs Abs 0.4 (*)    All other components within normal limits  I-STAT CHEM 8, ED - Abnormal; Notable for the following components:   Potassium 5.8 (*)    BUN 54 (*)    Creatinine, Ser 4.40 (*)    Glucose, Bld 54 (*)    Calcium, Ion 1.12 (*)    TCO2 14 (*)    Hemoglobin 16.0 (*)    HCT 47.0 (*)    All other components within normal limits  I-STAT VENOUS BLOOD GAS, ED - Abnormal; Notable for the following  components:   pH, Ven 7.064 (*)    pCO2, Ven 39.5 (*)    Bicarbonate 11.3 (*)    TCO2 12 (*)    Acid-base deficit 18.0 (*)    Potassium 5.8 (*)    Calcium, Ion 1.12 (*)    Hemoglobin 15.6 (*)    All other components within normal limits  COMPREHENSIVE METABOLIC PANEL  LIPASE, BLOOD  LACTIC ACID, PLASMA  LACTIC ACID, PLASMA  TROPONIN I (HIGH SENSITIVITY)  TROPONIN I (HIGH SENSITIVITY)    EKG None  Radiology DG Chest Port 1 View  Result Date: 04/05/2023 CLINICAL DATA:  Abdominal pain, nausea and vomiting, tachypnea EXAM: PORTABLE CHEST 1 VIEW COMPARISON:  01/24/2023 FINDINGS: Single frontal view of the chest demonstrates an enlarged cardiac silhouette. Stable dual lead pacer/AICD. Increasing central vascular congestion, with bilateral perihilar airspace disease. Trace bilateral pleural effusions. No pneumothorax. IMPRESSION: 1. Constellation of findings favoring congestive heart failure and mild pulmonary edema. Superimposed infection cannot be excluded. Electronically Signed   By: Sharlet Salina M.D.   On: 04/05/2023 19:44    Procedures Procedures  {Document cardiac monitor, telemetry assessment procedure when appropriate:1}  Medications Ordered in ED Medications  calcium gluconate 1 g/ 50 mL sodium chloride IVPB (1,000 mg Intravenous New Bag/Given 04/05/23 2012)  sodium bicarbonate 150 mEq in dextrose 5 % 1,150 mL infusion (has no administration in time range)  metoCLOPramide (REGLAN) injection 10 mg (10 mg Intravenous Given 04/05/23 1844)  fentaNYL (SUBLIMAZE) injection 50 mcg (50 mcg Intravenous Given 04/05/23 1844)  ondansetron (ZOFRAN) injection 4 mg (4 mg Intravenous Given 04/05/23 1842)  albuterol (PROVENTIL) (2.5 MG/3ML) 0.083% nebulizer solution 10 mg (10 mg Nebulization Given 04/05/23 1953)  insulin aspart (novoLOG) injection 5 Units (5 Units Intravenous Given 04/05/23 1937)    And  dextrose 50 % solution 50 mL (50 mLs Intravenous Given 04/05/23 1936)  sodium bicarbonate  injection 50 mEq (50 mEq Intravenous Given 04/05/23 1940)    ED Course/ Medical Decision Making/ A&P Clinical Course as of 04/05/23 2013  Wed Apr 05, 2023  1610 Patient complaining of increasing respiratory difficulty.  Sats are normal.  Pulmonary review of chest x-ray does suggest pulmonary edema will try BiPAP [JK]  1836 Patient with some improvement with BiPAP.  Still tachypneic.  Will continue to monitor closely [JK]  1841 I-stat chem 8, ED (not at [facility], [facility] or [facility])(!) Labs show elevated potassium of 5.8.  Bicarb is decreased at 14.  BUN/creatinine are elevated at 54 and 4.4 [JK]  1849 DIscussed with nephrology, will proceed with bicarb push and bicarb [JK]  2011 Brain natriuretic peptide(!) BNP elevated compared to previous values. [JK]  2011 CBC with Differential/Platelet(!) CBC normal [JK]  2012 X-ray suggests heart failure and mild pulmonary edema [JK]    Clinical Course User Index [JK] Linwood Dibbles, MD   {   Click here for ABCD2, HEART and other calculatorsREFRESH Note before signing :1}                          Medical Decision Making Differential diagnosis includes but not limited to pneumonia pneumothorax, CHF, metabolic derangement, acute kidney injury  Problems Addressed: Acute renal failure, unspecified acute renal failure type: acute illness or injury that poses a threat to life or bodily functions Metabolic acidosis: acute illness or injury that poses a threat to life or bodily functions  Amount and/or Complexity of Data Reviewed Labs:  Decision-making details documented in ED Course. Radiology: ordered.  Risk OTC drugs. Prescription drug management.   Patient presented to the ED for evaluation of shortness of breath recurrent nausea vomiting and abdominal pain.  Patient was admitted to the hospital recently for similar symptoms.  Patient was noted to have an acute kidney injury.  She was treated with IV fluids.  Her creatinine was improving.  Patient states  her  symptoms returned after leaving the hospital.  Patient was noted to have significant tachypnea and respiratory difficulty on arrival.  Lungs were clear.  Oxygen saturation was normal.  The laboratory test showed worsening renal failure with metabolic acidosis and hyperkalemia.  Patient was treated with BiPAP for her respiratory difficulty although it appears that most of her symptoms are most likely related to the metabolic acidosis.  I consulted with nephrology and the patient has been started on IV bicarb infusion.  patient was also treated for hyperkalemia.  {Document critical care time when appropriate:1} {Document review of labs and clinical decision tools ie heart score, Chads2Vasc2 etc:1}  {Document your independent review of radiology images, and any outside records:1} {Document your discussion with family members, caretakers, and with consultants:1} {Document social determinants of health affecting pt's care:1} {Document your decision making why or why not admission, treatments were needed:1} Final Clinical Impression(s) / ED Diagnoses Final diagnoses:  Acute renal failure, unspecified acute renal failure type  Metabolic acidosis    Rx / DC Orders ED Discharge Orders     None

## 2023-04-05 NOTE — Procedures (Signed)
Central Venous Catheter Insertion Procedure Note  Natasha Woodard  [id number]  May 26, 1941  Date:04/05/23  Time:10:06 PM   Provider Performing:Suprina Mandeville Celine Mans   Procedure: Insertion of Non-tunneled Central Venous [phone]) with US guidance ([id number])   Indication(s) Medication administration and Difficult access  Consent Risks of the procedure as well as the alternatives and risks of each were explained to the patient and/or caregiver.  Consent for the procedure was obtained and is signed in the bedside chart  Anesthesia Topical only with 1% lidocaine   Timeout Verified patient identification, verified procedure, site/side was marked, verified correct patient position, special equipment/implants available, medications/allergies/relevant history reviewed, required imaging and test results available.  Sterile Technique Maximal sterile technique including full sterile barrier drape, hand hygiene, sterile gown, sterile gloves, mask, hair covering, sterile ultrasound probe cover (if used).  Procedure Description Area of catheter insertion was cleaned with chlorhexidine and draped in sterile fashion.  With real-time ultrasound guidance a central venous catheter was placed into the right internal jugular vein. Nonpulsatile blood flow and easy flushing noted in all ports.  The catheter was sutured in place and sterile dressing applied.  Complications/Tolerance None; patient tolerated the procedure well. Chest X-ray is ordered to verify placement for internal jugular or subclavian cannulation.   Chest x-ray is not ordered for femoral cannulation.  EBL Minimal  Specimen(s) None   Rutherford Guys, PA - [facility] For pager details, please see AMION or use Epic chat  After 1900, please call ELINK for cross coverage needs 04/05/2023, 10:06 PM

## 2023-04-05 NOTE — Patient Instructions (Signed)
Visit Information  Phone number for Pharmacist: [phone]  Thank you for meeting with me to discuss your medications! Below is a summary of what we talked about during the visit:   Recommendations/Changes made from today's visit: -Ordered ondansetron ODT 4 mg #20 to replace regular ondansetron tablets -Encouraged fluid intake - water, Pedialyte and/or gatorade to replace losses from vomiting; if SOB or significant abdominal pain develops, go to ED -Given continued vomiting/risk for dehydration, reasonable to continue to hold torsemide until appt with PCP 4/25   Follow up plan: -Pharmacist follow up televisit scheduled for 1 month -PCP appt 04/06/23; Cardiology appt 04/07/23   Al Corpus, PharmD, BCACP Clinical Pharmacist [facility] [phone]

## 2023-04-05 NOTE — IPAL (Signed)
  Interdisciplinary Goals of Care Family Meeting   Date carried out:: 04/05/2023  Location of the meeting: Bedside  Member's involved: Physician and Family Member or next of kin  Durable Power of Attorney or Environmental health practitioner: patient    Discussion: We discussed goals of care for Natasha Woodard .  I met with Natasha Woodard, her husband, daughter, and sister. We reviewed her history and my concern that her heart could be causing some of her symptoms. We reviewed the plan for admission. We reviewed code status. She reported she does not want resuscitation and confirmed this when asked a second time. Her husband and daughter disagree with her decision, and I have encouraged them to discuss this as a family. We will honor [name]' wish as she is capable of making her own decisions at this time. Her daughter left the room and we discussed in the hallway. She was tearful but agrees her mother is of sound mind.     Code status: Limited Code or DNR with short term  Disposition: Continue current acute care   Time spent for the meeting: 15 min.  Steffanie Dunn 04/05/2023, 9:21 PM

## 2023-04-05 NOTE — ED Notes (Signed)
Family requested the Bipap be checked.  RN called Respiratory therapy to check it, they are on their way to bedside.

## 2023-04-05 NOTE — ED Triage Notes (Signed)
Pt coming from home having abdominal pain with nausea and vomiting that has worsened in the last 2 to 3 hours. Was seen two days ago. Zofran not working  122/64 74 96 Room air 108

## 2023-04-06 ENCOUNTER — Inpatient Hospital Stay (HOSPITAL_COMMUNITY): Payer: Medicare Other

## 2023-04-06 ENCOUNTER — Inpatient Hospital Stay: Payer: Medicare Other | Admitting: Family Medicine

## 2023-04-06 DIAGNOSIS — J9601 Acute respiratory failure with hypoxia: Secondary | ICD-10-CM

## 2023-04-06 DIAGNOSIS — I5023 Acute on chronic systolic (congestive) heart failure: Secondary | ICD-10-CM

## 2023-04-06 DIAGNOSIS — N179 Acute kidney failure, unspecified: Secondary | ICD-10-CM

## 2023-04-06 DIAGNOSIS — E872 Acidosis, unspecified: Secondary | ICD-10-CM | POA: Diagnosis not present

## 2023-04-06 LAB — ECHOCARDIOGRAM COMPLETE
AR max vel: 2.33 cm2
AV Area VTI: 1.81 cm2
AV Area mean vel: 2.09 cm2
AV Mean grad: 3 mmHg
AV Peak grad: 5.3 mmHg
Ao pk vel: 1.15 m/s
Area-P 1/2: 5.02 cm2
Calc EF: 17.5 %
Est EF: <20
Height: 62 in
MV M vel: 5.73 m/s
MV Peak grad: 131.3 mmHg
Radius: 0.8 cm
S' Lateral: 5.3 cm
Single Plane A2C EF: 31.1 %
Single Plane A4C EF: 4.3 %
Weight: 2486.79 oz

## 2023-04-06 LAB — PHOSPHORUS: Phosphorus: 7.4 mg/dL — ABNORMAL HIGH (ref 2.5–4.6)

## 2023-04-06 LAB — BRAIN NATRIURETIC PEPTIDE: B Natriuretic Peptide: 2843.8 pg/mL — ABNORMAL HIGH (ref 0.0–100.0)

## 2023-04-06 LAB — COMPREHENSIVE METABOLIC PANEL
ALT: 1643 U/L — ABNORMAL HIGH (ref 0–44)
AST: 2195 U/L — ABNORMAL HIGH (ref 15–41)
Albumin: 3.4 g/dL — ABNORMAL LOW (ref 3.5–5.0)
Alkaline Phosphatase: 212 U/L — ABNORMAL HIGH (ref 38–126)
Anion gap: 14 (ref 5–15)
BUN: 62 mg/dL — ABNORMAL HIGH (ref 8–23)
CO2: 29 mmol/L (ref 22–32)
Calcium: 9.2 mg/dL (ref 8.9–10.3)
Chloride: 99 mmol/L (ref 98–111)
Creatinine, Ser: 4.08 mg/dL — ABNORMAL HIGH (ref 0.44–1.00)
GFR, Estimated: 10 mL/min — ABNORMAL LOW (ref >60)
Glucose, Bld: 92 mg/dL (ref 70–99)
Potassium: 4.5 mmol/L (ref 3.5–5.1)
Sodium: 142 mmol/L (ref 135–145)
Total Bilirubin: 1.8 mg/dL — ABNORMAL HIGH (ref 0.3–1.2)
Total Protein: 6.2 g/dL — ABNORMAL LOW (ref 6.5–8.1)

## 2023-04-06 LAB — GLUCOSE, CAPILLARY
Glucose-Capillary: 88 mg/dL (ref 70–99)
Glucose-Capillary: 51 mg/dL — ABNORMAL LOW (ref 70–99)
Glucose-Capillary: 69 mg/dL — ABNORMAL LOW (ref 70–99)
Glucose-Capillary: 95 mg/dL (ref 70–99)
Glucose-Capillary: 157 mg/dL — ABNORMAL HIGH (ref 70–99)

## 2023-04-06 LAB — CBC
HCT: 40.7 % (ref 36.0–46.0)
Hemoglobin: 11.9 g/dL — ABNORMAL LOW (ref 12.0–15.0)
MCH: 29.6 pg (ref 26.0–34.0)
MCHC: 29.2 g/dL — ABNORMAL LOW (ref 30.0–36.0)
MCV: 101.2 fL — ABNORMAL HIGH (ref 80.0–100.0)
Platelets: 215 10*3/uL (ref 150–400)
RBC: 4.02 MIL/uL (ref 3.87–5.11)
RDW: 15.8 % — ABNORMAL HIGH (ref 11.5–15.5)
WBC: 12.2 10*3/uL — ABNORMAL HIGH (ref 4.0–10.5)
nRBC: 0.2 % (ref 0.0–0.2)

## 2023-04-06 LAB — BASIC METABOLIC PANEL
Anion gap: 28 — ABNORMAL HIGH (ref 5–15)
Anion gap: 18 — ABNORMAL HIGH (ref 5–15)
BUN: 62 mg/dL — ABNORMAL HIGH (ref 8–23)
BUN: 62 mg/dL — ABNORMAL HIGH (ref 8–23)
CO2: 11 mmol/L — ABNORMAL LOW (ref 22–32)
CO2: 25 mmol/L (ref 22–32)
Calcium: 9.6 mg/dL (ref 8.9–10.3)
Calcium: 9.5 mg/dL (ref 8.9–10.3)
Chloride: 97 mmol/L — ABNORMAL LOW (ref 98–111)
Chloride: 97 mmol/L — ABNORMAL LOW (ref 98–111)
Creatinine, Ser: 4.22 mg/dL — ABNORMAL HIGH (ref 0.44–1.00)
Creatinine, Ser: 4.1 mg/dL — ABNORMAL HIGH (ref 0.44–1.00)
GFR, Estimated: 10 mL/min — ABNORMAL LOW (ref >60)
GFR, Estimated: 10 mL/min — ABNORMAL LOW (ref >60)
Glucose, Bld: 260 mg/dL — ABNORMAL HIGH (ref 70–99)
Glucose, Bld: 148 mg/dL — ABNORMAL HIGH (ref 70–99)
Potassium: 6 mmol/L — ABNORMAL HIGH (ref 3.5–5.1)
Potassium: 4.8 mmol/L (ref 3.5–5.1)
Sodium: 136 mmol/L (ref 135–145)
Sodium: 140 mmol/L (ref 135–145)

## 2023-04-06 LAB — BLOOD GAS, VENOUS
Acid-Base Excess: 8.5 mmol/L — ABNORMAL HIGH (ref 0.0–2.0)
Bicarbonate: 34.5 mmol/L — ABNORMAL HIGH (ref 20.0–28.0)
O2 Saturation: 73.1 %
Patient temperature: 37
pCO2, Ven: 52 mmHg (ref 44–60)
pH, Ven: 7.43 (ref 7.25–7.43)
pO2, Ven: 46 mmHg — ABNORMAL HIGH (ref 32–45)

## 2023-04-06 LAB — CBG MONITORING, ED
Glucose-Capillary: 139 mg/dL — ABNORMAL HIGH (ref 70–99)
Glucose-Capillary: 182 mg/dL — ABNORMAL HIGH (ref 70–99)
Glucose-Capillary: 207 mg/dL — ABNORMAL HIGH (ref 70–99)
Glucose-Capillary: 216 mg/dL — ABNORMAL HIGH (ref 70–99)
Glucose-Capillary: 240 mg/dL — ABNORMAL HIGH (ref 70–99)

## 2023-04-06 LAB — SODIUM, URINE, RANDOM: Sodium, Ur: 52 mmol/L

## 2023-04-06 LAB — I-STAT VENOUS BLOOD GAS, ED
Acid-Base Excess: 1 mmol/L (ref 0.0–2.0)
Bicarbonate: 27 mmol/L (ref 20.0–28.0)
Calcium, Ion: 1.12 mmol/L — ABNORMAL LOW (ref 1.15–1.40)
HCT: 38 % (ref 36.0–46.0)
Hemoglobin: 12.9 g/dL (ref 12.0–15.0)
O2 Saturation: 99 %
Potassium: 5 mmol/L (ref 3.5–5.1)
Sodium: 139 mmol/L (ref 135–145)
TCO2: 28 mmol/L (ref 22–32)
pCO2, Ven: 45.2 mmHg (ref 44–60)
pH, Ven: 7.384 (ref 7.25–7.43)
pO2, Ven: 127 mmHg — ABNORMAL HIGH (ref 32–45)

## 2023-04-06 LAB — URINALYSIS, ROUTINE W REFLEX MICROSCOPIC
Bilirubin Urine: NEGATIVE
Glucose, UA: NEGATIVE mg/dL
Ketones, ur: NEGATIVE mg/dL
Nitrite: NEGATIVE
Protein, ur: 30 mg/dL — AB
Specific Gravity, Urine: 1.025 (ref 1.005–1.030)
pH: 5.5 (ref 5.0–8.0)

## 2023-04-06 LAB — LACTIC ACID, PLASMA
Lactic Acid, Venous: 1.9 mmol/L (ref 0.5–1.9)
Lactic Acid, Venous: >9 mmol/L (ref 0.5–1.9)
Lactic Acid, Venous: >9 mmol/L (ref 0.5–1.9)

## 2023-04-06 LAB — URINALYSIS, MICROSCOPIC (REFLEX): Squamous Epithelial / HPF: NONE SEEN /HPF (ref 0–5)

## 2023-04-06 LAB — PROCALCITONIN: Procalcitonin: 0.81 ng/mL

## 2023-04-06 LAB — MRSA NEXT GEN BY PCR, NASAL: MRSA by PCR Next Gen: NOT DETECTED

## 2023-04-06 LAB — CREATININE, URINE, RANDOM: Creatinine, Urine: 58 mg/dL

## 2023-04-06 LAB — MAGNESIUM: Magnesium: 2.4 mg/dL (ref 1.7–2.4)

## 2023-04-06 MED ORDER — SODIUM CHLORIDE 0.9 % IV SOLN
1.0000 g | INTRAVENOUS | Status: DC
  Administered 2023-04-06 – 2023-04-07 (×2): 1 g via INTRAVENOUS
  Filled 2023-04-06 (×2): qty 10

## 2023-04-06 MED ORDER — APIXABAN 2.5 MG PO TABS
2.5000 mg | ORAL_TABLET | Freq: Two times a day (BID) | ORAL | Status: DC
  Administered 2023-04-06 – 2023-04-07 (×3): 2.5 mg via ORAL
  Filled 2023-04-06 (×3): qty 1

## 2023-04-06 MED ORDER — CLOPIDOGREL BISULFATE 75 MG PO TABS
75.0000 mg | ORAL_TABLET | Freq: Every day | ORAL | Status: DC
  Administered 2023-04-06 – 2023-04-16 (×11): 75 mg via ORAL
  Filled 2023-04-06 (×11): qty 1

## 2023-04-06 MED ORDER — STERILE WATER FOR INJECTION IV SOLN
INTRAVENOUS | Status: DC
  Filled 2023-04-06: qty 1000

## 2023-04-06 MED ORDER — TRAMADOL HCL 50 MG PO TABS
25.0000 mg | ORAL_TABLET | Freq: Two times a day (BID) | ORAL | Status: DC | PRN
  Administered 2023-04-06 – 2023-04-16 (×19): 25 mg via ORAL
  Filled 2023-04-06 (×20): qty 1

## 2023-04-06 MED ORDER — SODIUM BICARBONATE 8.4 % IV SOLN
50.0000 meq | Freq: Once | INTRAVENOUS | Status: AC
  Administered 2023-04-06: 50 meq via INTRAVENOUS

## 2023-04-06 MED ORDER — FUROSEMIDE 10 MG/ML IJ SOLN
80.0000 mg | Freq: Four times a day (QID) | INTRAMUSCULAR | Status: AC
  Administered 2023-04-06 (×2): 80 mg via INTRAVENOUS
  Filled 2023-04-06 (×2): qty 8

## 2023-04-06 MED ORDER — SODIUM CHLORIDE 0.9 % IV SOLN
2.0000 g | Freq: Once | INTRAVENOUS | Status: AC
  Administered 2023-04-06: 2 g via INTRAVENOUS
  Filled 2023-04-06: qty 12.5

## 2023-04-06 MED ORDER — SODIUM BICARBONATE 8.4 % IV SOLN
50.0000 meq | Freq: Once | INTRAVENOUS | Status: AC
  Administered 2023-04-06: 50 meq via INTRAVENOUS
  Filled 2023-04-06: qty 100

## 2023-04-06 MED ORDER — SODIUM ZIRCONIUM CYCLOSILICATE 10 G PO PACK
10.0000 g | PACK | Freq: Once | ORAL | Status: AC
  Administered 2023-04-06: 10 g via ORAL
  Filled 2023-04-06: qty 1

## 2023-04-06 MED ORDER — AMIODARONE HCL 200 MG PO TABS
200.0000 mg | ORAL_TABLET | Freq: Every day | ORAL | Status: DC
  Administered 2023-04-06 – 2023-04-16 (×11): 200 mg via ORAL
  Filled 2023-04-06 (×11): qty 1

## 2023-04-06 MED ORDER — SODIUM BICARBONATE 8.4 % IV SOLN
50.0000 meq | INTRAVENOUS | Status: DC
  Administered 2023-04-06 (×2): 50 meq via INTRAVENOUS
  Filled 2023-04-06 (×2): qty 50

## 2023-04-06 MED ORDER — ONDANSETRON 4 MG PO TBDP
4.0000 mg | ORAL_TABLET | Freq: Three times a day (TID) | ORAL | Status: DC | PRN
  Administered 2023-04-12: 4 mg via ORAL
  Filled 2023-04-06: qty 1

## 2023-04-06 MED ORDER — ROSUVASTATIN CALCIUM 5 MG PO TABS: 10.0000 mg | ORAL_TABLET | Freq: Every day | ORAL | Status: DC

## 2023-04-06 MED ORDER — CHLORHEXIDINE GLUCONATE CLOTH 2 % EX PADS
6.0000 | MEDICATED_PAD | Freq: Every day | CUTANEOUS | Status: DC
  Administered 2023-04-06 – 2023-04-07 (×2): 6 via TOPICAL

## 2023-04-06 MED ORDER — ORAL CARE MOUTH RINSE: 15.0000 mL | OROMUCOSAL | Status: DC | PRN

## 2023-04-06 NOTE — Progress Notes (Signed)
Pharmacy Antibiotic Note  Natasha Woodard is a 82 y.o. female admitted on 04/05/2023 with sepsis.  Pharmacy has been consulted for cefepime dosing.  Pt w/ AKI on CKD.  Plan: Cefepime 2g IV x1 followed by 1g IV Q24H.  Height:  (157.5 cm) Weight: 70.5 kg (155 lb 6.8 oz) IBW/kg (Calculated) : 50.1  Temp (24hrs), Avg:96.9 F (36.1 C), Min:96.3 F (35.7 C), Max:98 F (36.7 C)  Recent Labs  Lab 03/30/23 1845 03/31/23 0706 04/01/23 0359 04/02/23 0154 04/03/23 0241 04/05/23 1820 04/05/23 1839 04/05/23 2050 04/05/23 2255  WBC 4.6  --  4.8 4.5 5.5 6.5  --   --   --   CREATININE 3.06*   < > 2.76* 2.85* 2.48* 4.29* 4.40*  --   --   LATICACIDVEN  --   --   --   --   --  >9.0*  --  >9.0* >9.0*   < > = values in this interval not displayed.    Estimated Creatinine Clearance: 9.1 mL/min (A) (by C-G formula based on SCr of 4.4 mg/dL (H)).    Allergies  Allergen Reactions   Ezetimibe-Simvastatin Other (See Comments)    REACTION: Muscle aches (side effect)   Lipitor [Atorvastatin] Other (See Comments)    Leg weakness    Claritin [Loratadine]     Possible cause of nightmares.     Spironolactone     Caution re: elevated potassium/creatinine   Tamiflu [Oseltamivir Phosphate] Other (See Comments)    nightmares   Pravastatin Sodium Other (See Comments)    REACTION: Muscle aches (side effect)   Sulfonamide Derivatives Nausea And Vomiting    Thank you for allowing pharmacy to be a part of this patient's care.  Vernard Gambles, PharmD, BCPS  04/06/2023 1:17 AM

## 2023-04-06 NOTE — Progress Notes (Signed)
Pt transported from ED27 to 2H12 by RN and RT w/o complications.

## 2023-04-06 NOTE — Progress Notes (Signed)
Latest Reference Range & Units 04/06/23 16:50    Glucose-Capillary 70 - 99 mg/dL 69 (L)  (L): Data is abnormally low  Initial reading 51, glucose retaken 69. Pt had been NPO but had just finished an Ensure and jello. Orange juice also given to patient.

## 2023-04-06 NOTE — Progress Notes (Signed)
eLink Physician-Brief Progress Note Patient Name: Natasha Woodard DOB: Nov 07, 1941 MRN: [id number]   Date of Service  04/06/2023  HPI/Events of Note  Complaining of discomfort.  Patient takes tramadol 50 mg po bid at home.  eICU Interventions  Tramadol 25 mg po bid prn started     Intervention Category Intermediate Interventions: Other:  Henry Russel, P 04/06/2023, 10:14 PM

## 2023-04-06 NOTE — Progress Notes (Signed)
[facility] Progress Note  Name: CHELLSEA BECKERS MRN: [id number] DOB: 1941/05/30  Chief Complaint:  Nausea/vomiting  Subjective:  She was transferred to 2 heart from the ER.  She is charted as having 390 mL UOP over 4/25.  Per RN 20-30 ml/hr since.  She was on bipap and has been weaned to 3 liters oxygen.  Her sister and grandson are at bedside and I spoke with her husband via speakerphone.  She would like to eat or drink and per RN is still waiting on scans. Not sure what she would want about dialysis.   Review of systems:  She reports shortness of breath has improved  She states n/v better; got a suppository downstairs but no BM; no BM for several days   ---------------- Background on referral:  IMOGENE GRAVELLE is an 82 y.o. female with DM, CAD h/o MI 01/2023 (complicated course), HFpEF, PAD, OSA, A fib, CKD3 currently admitted with N/V, severe acidosis and AKI for which nephrology is consulted.  Prolonged hospitalization 01/11/23 - 02/19/23 MI, cardiac arrest, cardiogenic shock req IABP, s/p ICD discharged to CIR where she stayed 3/10-3/19/24.  Readmitted 4/19-22 with N/V/abd pain with unrevealing CT.  Found to have enterotoxigenic E. coli ; AKI Cr ~3 from recent baseline 1.3-1.6mg /dL with neg renal US.  Improved with fluids, withholding ARB and diuretics to 2.4 at discharge.  Returned to ED today with N/V - triage VS 122/64, O2 96% ra, afebrile.  Labs showing Na 139, K 6, Bicarb 11, BUN 57, Cr 4.3, AG 29, AST 804, ALT 723 (from normal 7/20), T bili 2.7 (normal 4/20), BNP 2550; VBG 7.06, 38, lactate 9.   CXR mild pulm edema vs infection (favor former).  Renal US normal.  Due to increased WOB placed on bipap.  Potassium managed medically with shifting and given 1 amp bicarb + bicarb gtt.  PCCM consulted for admission.  Pt currently awake and alert on bipap with family bedside.  N/V/abd pain with po intake but not with palpation x days -- dehydrated.  Was taking meds until today -  inc torsemide 20 daily but doesn't appear to have been on MRB or ARB.  Decreased urine volume, normal character.   +LE edema recently.  No NSAID, no contrast.  Pt getting central line and going to ICU.     Intake/Output Summary (Last 24 hours) at 04/06/2023 1530 Last data filed at 04/06/2023 1500 Gross per 24 hour  Intake 100 ml  Output 390 ml  Net -290 ml    Vitals:  Vitals:   04/06/23 1200 04/06/23 1230 04/06/23 1300 04/06/23 1455  BP: 116/69 123/72 127/75   Pulse: 72 70 70 70  Resp: 17 (!) 26 (!) 23 12  Temp: (!) 97.2 F (36.2 C)     TempSrc: Axillary     SpO2: 100% 100% 100% 100%  Weight: 77.2 kg     Height:         Physical Exam:  General elderly female in bed in no acute distress HEENT normocephalic atraumatic extraocular movements intact sclera anicteric Neck supple trachea midline Lungs clear to auscultation bilaterally normal work of breathing at rest on 3 liters oxygen  Heart S1S2 no rub Abdomen soft nontender for me nondistended Extremities 1+ edema lower extremities Psych normal mood and affect Neuro alert and oriented x 3 provides hx and follows commands; hard of hearing  GU foley in place   Medications reviewed   Labs:     Latest Ref Rng &  Units 04/06/2023    7:51 AM 04/06/2023    7:29 AM 04/06/2023   12:58 AM  BMP  Glucose 70 - 99 mg/dL  161  096   BUN 8 - 23 mg/dL  62  62   Creatinine 0.45 - 1.00 mg/dL  4.09  8.11   Sodium 914 - 145 mmol/L 139  140  136   Potassium 3.5 - 5.1 mmol/L 5.0  4.8  6.0   Chloride 98 - 111 mmol/L  97  97   CO2 22 - 32 mmol/L  25  11   Calcium 8.9 - 10.3 mg/dL  9.5  9.6      Assessment/Plan:    **severe anion gap metabolic acidosis:   - on bicarb gtt and I gave additional amps bicarb this AM.  CXR concerning for mild pulm edema    **AKI, severe on CKD 3:  - Secondary to pre-renal insults - n/v as well as ongoing diuretic.  Some ischemic insults as well with concern for cardiogenic shock and may have had some  progression to ATN.  Consider atypical HUS however recent E coli was non shiga toxin producing; doubt this as etiology.   Renal US normal echogenicity and no hydro  - BL Cr 1.5-1.8 - Continue supportive measures  - note that BMP is pending - Currently no indications for RRT but may develop in the next 24-48h - this was discussed with the patient and she was not clear that she would do dialysis  # Acute hypoxic respiratory failure - on 3 liters oxygen  - she is off of bicarb gtt given concern for fluid  - can give lasix 40-60 mg IV once if needed to optimize cardiopulmonary status  # Possible Diverticulitis  - Scan with diverticulosis with possible mild diverticulitis  - per primary team    **Hyperkalemia:   - improved with medical measures thus far - bicarb    ** CKD stage 3  - note baseline Cr 1.5 -1.8  **N/V/abd pain:  recent ETEC  - per primary team.  CT scan as above    **Transaminitis:  per primary - suspected to be congestive   **CAD s/p recent NSTEMI, A fib: - per primary team   Disposition - Continue inpatient monitoring   Estanislado Emms, MD 04/06/2023 4:10 PM

## 2023-04-06 NOTE — Progress Notes (Signed)
Pt taken off BiPAP and placed on 3L [place] by RT per CCM. Pt tolerating well, no increased WOB,vitals stable, RT will monitor as needed.     04/06/23 1455  Therapy Vitals  Pulse Rate 70  Resp 12  MEWS Score/Color  MEWS Score 1  MEWS Score Color Green  Respiratory Assessment  Assessment Type Assess only  Respiratory Pattern Regular;Unlabored  Chest Assessment Chest expansion symmetrical  Cough Non-productive  Bilateral Breath Sounds Clear;Diminished  Oxygen Therapy/Pulse Ox  O2 Device Nasal Cannula  O2 Therapy Oxygen  O2 Flow Rate (L/min) 3 L/min  SpO2 100 %

## 2023-04-06 NOTE — Progress Notes (Signed)
RT and RN transported patient to CT and back to 027 without event.

## 2023-04-06 NOTE — Progress Notes (Signed)
IJ plethoric on [place] by report during CVC insertion. D/w nephrology--concern for cardiogenic cause of AKI and acidosis Lasix + bicarb pushes rather than infusion-- orders updated.  CVP monitoring once she gets to ICU AM coox. Right now no need for inotropic support with normal BP and coox 72%.  Steffanie Dunn, DO 04/06/23 12:12 AM [facility]  For contact information, see Amion. If no response to pager, please call PCCM consult pager. After hours, 7PM- 7AM, please call Elink.

## 2023-04-06 NOTE — ED Notes (Signed)
RN to call back for report 

## 2023-04-06 NOTE — ED Notes (Signed)
Spouse aware of room transfer

## 2023-04-06 NOTE — Consult Note (Addendum)
Cardiology Consultation   Patient ID: KAHMARI HERARD MRN: [id number]; DOB: [birth year]  Admit date: 04/05/2023 Date of Consult: 04/06/2023  PCP:  [name], MD   [facility] Providers Cardiologist:  Bryan Lemma, MD    Heart failure specialist: Dr. Gala Romney PV specialist: Dr. Kirke Corin   Patient Profile:   Natasha Woodard is a 82 y.o. female with a hx of NICM secondary to frequent PVCs, CAD, HTN, HLD, DM II, PAD, CKD stage III, OSA, PAF and h/o ICD 01/24/2023 by Dr. Ladona Ridgel who is being seen 04/06/2023 for the evaluation of heart failure at the request of Dr. Chestine Spore.  History of Present Illness:   Natasha Woodard is an 82 year old female with past medical history of NICM secondary to frequent PVCs, CAD, HTN, HLD, DM II, PAD, CKD stage III, OSA, PAF and h/o ICD 01/24/2023 by Dr. Ladona Ridgel.  She was first diagnosed with nonischemic cardiomyopathy in 2018 while her echocardiogram showed her EF 20 to 25% with mild MR.  Subsequent cardiac catheterization revealed mild to moderate nonobstructive CAD.  Holter monitor showed PVC burden up to 13% which felt to have contributed to the cardiomyopathy.  She was placed on amiodarone for suppression.  Subsequent repeat Holter monitor showed improvement with only 1% PVC burden.  She also has mild obstructive sleep apnea.  EF improved to 50 to 55% by echocardiogram in 2020 with mild MR.  Repeat echocardiogram in 2021 showed EF 60 to 65%.    She was admitted was NSTEMI in late January 2024 was high-sensitivity troponin greater than 4500.  Repeat echocardiogram showed EF has reduced back down to 20 to 25% with akinesis of the apical to mid lateral wall at the apex, all other segments hypokinetic.  There is also evidence of severe ischemic mitral regurgitation.  Subsequent left and right heart cath showed occluded distal left circumflex marginal vessel treated with DES, mild to moderate disease in the LAD and RCA.  Right heart cath demonstrated  moderate pulmonary hypertension, low cardiac output with cardiac index 2.1 and a mean PA pressure 48 mmHg.  Intra-aortic balloon pump was placed in the Cath Lab, however patient developed expanding hematoma at the IABP access site.  She also developed significant limb ischemia with cold feet and absent pulses.  IABP was subsequently removed.  She was seen by vascular surgery and underwent thrombin injection to right femoral PSA.  She developed a ruptured PSA require OR repair by vascular surgery.  She remained in the hospital for over 7 weeks following the initial MI.  She was treated for low output heart failure and shock with midodrine and milrinone.  She was also placed on amiodarone due to recurrent A-fib.  Brilinta was switched to Plavix and aspirin was dropped due to the need for Eliquis.  SGLT2 was not added due to groin wound.  TEE performed on 01/18/2023 demonstrated EF 30 to 35% with moderate MR.  Unfortunately she developed VT arrest on 01/19/2023 that require 1 shock and achieved ROSC after 2 minutes.  She underwent [employer] ICD implantation by Dr. Ladona Ridgel on 01/24/2023.  She returned to the OR multiple times for washout and wound VAC change.  Zosyn was placed for wound infection.  She eventually was discharged to CIR on 02/10/2023 and eventually released from [facility] 02/28/2023.  She returned back to the hospital on 03/31/2023 with nausea, abdominal discomfort and vomiting and was diagnosed with acute gastroenteritis due to enteraotoxigenic E. coli.  Hospital course was complicated by AKI  with creatinine trended up to greater than 3 before coming back down to 2.4.  Since discharge, she continued to have nausea, vomiting and abdominal discomfort.  She returned back to the hospital on 04/06/2023.  On arrival, she was tachypneic requiring BiPAP therapy.  She had severe metabolic acidosis with pH 7.047 and a lactic acid greater than 9.  EKG showed paced rhythm.  Initial creatinine has trended up from the  previous 2.48 up to 4.29.  Previous baseline prior to January was 1.5-1.8.  Central line was placed and the patient was admitted to ICU.  Nephrology was consulted and felt patient likely had prerenal insult was possible cardiogenic shock.  Initially, the concern was cardiogenic shock, interestingly coox has been in the 70s, therefore it was thought patient may have ischemic bowel.  She has been made a DNR.  Echocardiogram obtained on 04/06/2023 showed EF less than 20%, severe global hypokinesis with regional wall motion abnormality, grade 3 DD, elevated LVEDP, akinesis of LV, mid apical lateral wall and inferolateral wall, RVSP 72.5 mmHg, severe LAE, moderate to severe MR. Cardiology service consultation has failure.  During interview, patient is still alert and oriented.  She denies significant abdominal pain at this time.  She also denies any chest pain or shortness of breath.   Past Medical History:  Diagnosis Date   AICD (automatic cardioverter/defibrillator) present    Calcific tendonitis 2008   Treatment of left leg   Cardiomyopathy 06/2017   a) Echo: EF 25-30%. GR 1-2 DD w/ elevated LVEDP. Mild valvular Dz; b) Cardiac MRI 2/'19: frequent PVCs (diffiuclt to interpret) - EF ~27% w/ diffuse HK.  No evidence of infarct, infiltrative Dz or myocarditis. -- ? if related to PVCs. c) f/u Echo 6/'19: EF 35-40%. Gr 1 DD. Diffuse HK.-> d) Jan 2020 EF 50 to 55%.  Mod MAC, mod LAE.  Ao Sclerosis; e) Echo 1/'21 - EF 60-65%, Gr II DD. Mild Mod MR.    Chronic combined systolic and diastolic CHF, NYHA class 2 and ACA/AHA stage C    Now essentially resolved, back to simply diastolic CHF   Coronary artery disease, non-occlusive    mild-moderate CAD 06/30/17 cath   CTS (carpal tunnel syndrome)    Diabetes mellitus    Type II   Dysrhythmia    patient said that she cant remember what it is   Frequent unifocal PVCs 01/2018   Event Monitor: NSR, W/ frequent multifocal PVCs (9%-down from 30% prior to amiodarone) and  PACs.  Nighttime bradycardia suggestive of OSA.';  Zio patch September 2023: PVC burden now 6.1%.   Hiatal hernia    with reflux   Hyperlipidemia    Statin intolerant   Hypertension    Ichthyosis congenita    Intermittent claudication of both lower extremities due to atherosclerosis 08/22/2021   LEA Dopplers 09/09/2021:  Right: Total occlusion noted in the superficial femoral artery. Atherosclerosis noted throughout extremity, see note above. Three vessel runoff.  Left: Total occlusion noted in the superficial femoral artery and/or popliteal artery. Total occlusion noted in the distal anterior tibial artery. Athereosclerosis noted throughout extremity.    NSVD (normal spontaneous vaginal delivery) 1971 & 1972   Obesity    Plantar fasciitis    Left   Presence of permanent cardiac pacemaker    Pulmonary nodule    Imaged multiple times and benign appearing   Sleep apnea    Wears glasses     Past Surgical History:  Procedure Laterality Date   ABDOMINAL HYSTERECTOMY  1975   TAH-BSO   ABSCESS DRAINAGE  1972   right breast   APPLICATION OF WOUND VAC Right 01/15/2023   Procedure: APPLICATION OF INCISIONAL WOUND VAC;  Surgeon: Victorino Sparrow, MD;  Location: [facility] OR;  Service: Vascular;  Laterality: Right;   APPLICATION OF WOUND VAC Right 01/26/2023   Procedure: APPLICATION OF WOUND VAC RIGHT GROIN WASHOUT AND SARTORIUS MUSCLE FLAP;  Surgeon: Victorino Sparrow, MD;  Location: [facility] OR;  Service: Vascular;  Laterality: Right;   ARTERY REPAIR Right 01/15/2023   Procedure: RIGHT PROFUNDA ARTERY REPAIR WITH 2000G MYRIAD MORCELLS;  Surgeon: Victorino Sparrow, MD;  Location: [facility] OR;  Service: Vascular;  Laterality: Right;   BREAST BIOPSY Left 01/14/2009   Stereo- Benign   CARDIAC MRI  01/2018   Difficult to interpret 2/2 PVCs. Normal LV size - EF ~27% with diffuse HK. Mild RV dilation - normal fxn. -- NO MYOCARDIAL LGI => no definitive evidence of prior MI, Infiltrative Dz or Myocarditis -- suspect NICM,  possibly related to PVCs.   CARPAL TUNNEL RELEASE Right 07/02/2014   Procedure: RIGHT CARPAL TUNNEL RELEASE;  Surgeon: Nicki Reaper, MD;  Location: [facility];  Service: Orthopedics;  Laterality: Right;   CARPAL TUNNEL RELEASE Left 06/11/2015   Procedure: LEFT CARPAL TUNNEL RELEASE;  Surgeon: Cindee Salt, MD;  Location: [facility];  Service: Orthopedics;  Laterality: Left;  REGIONAL/FAB   COLONOSCOPY     corn removal  1968   right   CORONARY STENT INTERVENTION N/A 01/10/2023   Procedure: CORONARY STENT INTERVENTION;  Surgeon: Lennette Bihari, MD;  Location: [facility] INVASIVE CV LAB;  Service: Cardiovascular;  Laterality: N/A;   GROIN DEBRIDEMENT Right 02/06/2023   Procedure: RIGHT GROIN DEBRIDEMENT WITH WOUND VAC CHANGE;  Surgeon: Victorino Sparrow, MD;  Location: [facility] OR;  Service: Vascular;  Laterality: Right;   HEMATOMA EVACUATION Right 01/15/2023   Procedure: EVACUATION HEMATOMA RIGHT GROIN;  Surgeon: Victorino Sparrow, MD;  Location: [facility] OR;  Service: Vascular;  Laterality: Right;   Holter Monitor  06/2017   ~17,000 PVC beats - majority were singlets, some couplets. 44 brief 3-7 beat runs of NSVT. Also noted were less frequent PACs with 11 runs (longest 15 beats)   IABP INSERTION Right 01/10/2023   Procedure: IABP Insertion;  Surgeon: Lennette Bihari, MD;  Location: [facility] INVASIVE CV LAB;  Service: Cardiovascular;  Laterality: Right;   ICD IMPLANT N/A 01/23/2023   Procedure: ICD IMPLANT;  Surgeon: Marinus Maw, MD;  Location: [facility] INVASIVE CV LAB;  Service: Cardiovascular;  Laterality: N/A;   INCISION AND DRAINAGE OF WOUND Right 01/31/2023   Procedure: IRRIGATION AND DEBRIDEMENT RIGHT GROIN WOUND AND WOUND VAC CHANGE;  Surgeon: Victorino Sparrow, MD;  Location: [facility] OR;  Service: Vascular;  Laterality: Right;   OPEN REDUCTION INTERNAL FIXATION (ORIF) DISTAL RADIAL FRACTURE Left 07/21/2017   Procedure: OPEN REDUCTION INTERNAL FIXATION (ORIF) LEFT DISTAL RADIAL FRACTURE;  Surgeon:  Sheral Apley, MD;  Location: [facility] OR;  Service: Orthopedics;  Laterality: Left;   RIGHT/LEFT HEART CATH AND CORONARY ANGIOGRAPHY N/A 06/30/2017   Procedure: Right/Left Heart Cath and Coronary Angiography;  Surgeon: Marykay Lex, MD;  Location: [facility] INVASIVE CV LAB:  pRCA 55%, pCx 40%, OM1 45%, mCx 50%, D2 50% - LVEF 25-35%. Moderately elevated LVEDP(26 mmHg with PCWP 16 mmHg).  FICK CO/CI: 4.47/2.48. PA pressures 47/14 mmHg with a mean of 27 mm.   RIGHT/LEFT HEART CATH AND CORONARY ANGIOGRAPHY N/A 01/10/2023  Procedure: RIGHT/LEFT HEART CATH AND CORONARY ANGIOGRAPHY;  Surgeon: Lennette Bihari, MD;  Location: [facility] INVASIVE CV LAB;  Service: Cardiovascular;  Laterality: N/A;   ROTATOR CUFF REPAIR     left shoulder   TEMPORARY PACEMAKER N/A 01/19/2023   Procedure: TEMPORARY PACEMAKER;  Surgeon: Dolores Patty, MD;  Location: [facility] INVASIVE CV LAB;  Service: Cardiovascular;  Laterality: N/A;   TRANSTHORACIC ECHOCARDIOGRAM  06/2017   EF 25 and 30%. GR 1 DD. Mild diastolic dysfunction with elevated LVEDP. Mild valvular disease.   TRANSTHORACIC ECHOCARDIOGRAM  11'18, 6/'19    a) EF remains 30-35%.  Diffuse hypokinesis noted.  Severe LA dilation; b) Improved EF 35-40%.  Diffuse HK.  GR 1 DD.  Moderate TR.  Mild RV dilation.   TRANSTHORACIC ECHOCARDIOGRAM  01/28/2022   Follow-up echo: EF 50 to 55%.  No RWMA.  GRII DD.  Severe LA dilation.  Mild RA dilation with normal PAP and RAP.  Mild to moderate TR (no significant change noted)   Zio patch  08/2022   3-day: Currently NSR (HR range 47-91 with average 64 bpm) 1 F AVB with IVCD/BBB.  Rare isolated PACs (<1%).  Frequent PVCs (6.1% with rare couplets and triplets.  Also bigeminy.  2 atrial runs 8 beats 5 2 seconds.  No sustained arrhythmias.   ZIO Patch 14 d Event Monitor  10/2018   Results as below < 1% PVC.  (Notably improved after starting amiodarone)     Home Medications:  Prior to Admission medications   Medication Sig Start Date End Date  Taking? Authorizing Provider  acetaminophen (TYLENOL) 325 MG tablet Take 1-2 tablets (325-650 mg total) by mouth every 6 (six) hours as needed. Patient taking differently: Take 325-650 mg by mouth every 6 (six) hours as needed for mild pain. 02/28/23  Yes Setzer, Lynnell Jude, PA-C  amiodarone (PACERONE) 200 MG tablet Take 1 tablet (200 mg total) by mouth daily. 04/03/23 07/02/23 Yes Pokhrel, Rebekah Chesterfield, MD  apixaban (ELIQUIS) 2.5 MG TABS tablet Take 1 tablet (2.5 mg total) by mouth 2 (two) times daily. 04/03/23 04/02/24 Yes Pokhrel, Laxman, MD  clopidogrel (PLAVIX) 75 MG tablet Take 1 tablet (75 mg total) by mouth daily. 04/03/23 04/02/24 Yes Pokhrel, Laxman, MD  glucose blood (ACCU-CHEK AVIVA PLUS) test strip USE TO TEST BLOOD SUGAR ONCE DAILY 03/23/23  Yes [name], MD  melatonin 5 MG TABS Take 5 mg by mouth at bedtime as needed.   Yes [provider]  Multiple Vitamin (MULTIVITAMIN WITH MINERALS) TABS tablet Take 1 tablet by mouth daily. 03/01/23  Yes Setzer, Lynnell Jude, PA-C  ondansetron (ZOFRAN-ODT) 4 MG disintegrating tablet Take 1 tablet (4 mg total) by mouth every 8 (eight) hours as needed for nausea or vomiting. 04/05/23  Yes [name], MD  rosuvastatin (CRESTOR) 10 MG tablet Take 1 tablet (10 mg total) by mouth daily. TAKE 1 TABLET(10 MG) BY MOUTH DAILY Strength: 10 mg 02/28/23  Yes Setzer, Lynnell Jude, PA-C  torsemide (DEMADEX) 20 MG tablet Take 1 tablet (20 mg total) by mouth daily. 04/05/23 05/05/23 Yes Pokhrel, Laxman, MD  traMADol (ULTRAM) 50 MG tablet Take 1 tablet (50 mg total) by mouth 2 (two) times daily. 02/28/23  Yes Setzer, Lynnell Jude, PA-C    Inpatient Medications: Scheduled Meds:  amiodarone  200 mg Oral Daily   apixaban  2.5 mg Oral BID   Chlorhexidine Gluconate Cloth  6 each Topical Daily   clopidogrel  75 mg Oral Daily   insulin aspart  1-3 Units Subcutaneous Q4H   Continuous Infusions:  azithromycin Stopped (04/06/23 0025)   [START ON 04/07/2023] ceFEPime (MAXIPIME)  IV     PRN Meds: docusate sodium, ondansetron (ZOFRAN) IV, ondansetron, polyethylene glycol  Allergies:    Allergies  Allergen Reactions   Ezetimibe-Simvastatin Other (See Comments)    REACTION: Muscle aches (side effect)   Lipitor [Atorvastatin] Other (See Comments)    Leg weakness    Claritin [Loratadine]     Possible cause of nightmares.     Spironolactone     Caution re: elevated potassium/creatinine   Tamiflu [Oseltamivir Phosphate] Other (See Comments)    nightmares   Pravastatin Sodium Other (See Comments)    REACTION: Muscle aches (side effect)   Sulfonamide Derivatives Nausea And Vomiting    Social History:   Social History   Socioeconomic History   Marital status: Married    Spouse name: Not on file   Number of children: 2   Years of education: Not on file   Highest education level: Bachelor's degree (e.g., BA, AB, BS)  Occupational History    Employer: RETIRED  Tobacco Use   Smoking status: Former    Years: 28    Types: Cigarettes    Quit date: 12/12/1993    Years since quitting: 29.3    Passive exposure: Never   Smokeless tobacco: Never  Vaping Use   Vaping Use: Never used  Substance and Sexual Activity   Alcohol use: No    Alcohol/week: 0.0 standard drinks of alcohol   Drug use: No   Sexual activity: Never  Other Topics Concern   Not on file  Social History Narrative   Two kids, two grandchildren, two great grandchildren.    Married 1969   Retired.    Social Determinants of Health   Financial Resource Strain: Low Risk  (03/27/2023)   Overall Financial Resource Strain (CARDIA)    Difficulty of Paying Living Expenses: Not very hard  Food Insecurity: No Food Insecurity (04/06/2023)   Hunger Vital Sign    Worried About Running Out of Food in the Last Year: Never true    Ran Out of Food in the Last Year: Never true  Transportation Needs: No Transportation Needs (04/06/2023)   PRAPARE - Administrator, Civil Service (Medical): No     Lack of Transportation (Non-Medical): No  Physical Activity: Sufficiently Active (03/27/2023)   Exercise Vital Sign    Days of Exercise per Week: 4 days    Minutes of Exercise per Session: 40 min  Stress: No Stress Concern Present (03/27/2023)   [employer] of Occupational Health - Occupational Stress Questionnaire    Feeling of Stress : Only a little  Social Connections: Unknown (03/27/2023)   Social Connection and Isolation Panel [NHANES]    Frequency of Communication with Friends and Family: More than three times a week    Frequency of Social Gatherings with Friends and Family: Twice a week    Attends Religious Services: More than 4 times per year    Active Member of [employer] or Organizations: Not on file    Attends Banker Meetings: Not on file    Marital Status: Married  Intimate Partner Violence: Not At Risk (04/06/2023)   Humiliation, Afraid, Rape, and Kick questionnaire    Fear of Current or Ex-Partner: No    Emotionally Abused: No    Physically Abused: No    Sexually Abused: No    Family History:    Family History  Problem Relation Age of Onset   Heart disease Mother        s/p pacemaker   Diabetes Mother    Heart disease Father    Diabetes Father    Heart disease Brother        MI   Cancer Brother        Lung   Hypertension Other    Colon cancer Neg Hx    Breast cancer Neg Hx      ROS:  Please see the history of present illness.   All other ROS reviewed and negative.     Physical Exam/Data:   Vitals:   04/06/23 1600 04/06/23 1645 04/06/23 1700 04/06/23 1710  BP: 123/75   123/63  Pulse: 70   (!) 158  Resp: (!) 21  20 (!) 21  Temp: 98.1 F (36.7 C) 98.1 F (36.7 C)    TempSrc: Oral Oral    SpO2: 100%   (!) 51%  Weight:      Height:        Intake/Output Summary (Last 24 hours) at 04/06/2023 1741 Last data filed at 04/06/2023 1500 Gross per 24 hour  Intake 100 ml  Output 390 ml  Net -290 ml      04/06/2023   12:00 PM 04/05/2023     5:17 PM 03/31/2023    3:24 PM  Last 3 Weights  Weight (lbs) 170 lb 3.1 oz 155 lb 6.8 oz 155 lb 6.8 oz  Weight (kg) 77.2 kg 70.5 kg 70.5 kg     Body mass index is 31.13 kg/m.  General:  Well nourished, well developed, in no acute distress HEENT: normal Neck: no JVD Vascular: No carotid bruits; Distal pulses 2+ bilaterally Cardiac:  normal S1, S2; RRR; no murmur  Lungs:  clear to auscultation bilaterally, no wheezing, rhonchi or rales  Abd: soft, nontender, no hepatomegaly  Ext: no edema Musculoskeletal:  No deformities, BUE and BLE strength normal and equal Skin: Cool to touch Neuro:  CNs 2-12 intact, no focal abnormalities noted Psych:  Normal affect   EKG:  The EKG was personally reviewed and demonstrates: Paced rhythm Telemetry:  Telemetry was personally reviewed and demonstrates: Paced rhythm  Relevant CV Studies:  Echo 04/06/2023 1. Left ventricular ejection fraction, by estimation, is <20%. The left  ventricle has severely decreased function. The left ventricle demonstrate  severe global hypokinesis and regional wall motion abnormalities (see  scoring diagram/findings for  description). The left ventricular internal cavity size was mildly  dilated. Left ventricular diastolic parameters are consistent with Grade  III diastolic dysfunction (restrictive). Elevated left ventricular  end-diastolic pressure. There is akinesis of  the left ventricular, mid-apical lateral wall and inferolateral wall.  There is akinesis of the left ventricular, apical segment.   2. Right ventricular systolic function is moderately reduced. The right  ventricular size is normal. There is severely elevated pulmonary artery  systolic pressure. The estimated right ventricular systolic pressure is  72.5 mmHg.   3. Left atrial size was severely dilated.   4. Right atrial size was mildly dilated.   5. Based on eccentricity of MR and akinesis of the inferolateral and  lateral walls, suspect this is  ischemic MR due to restricted posterior  mitral valve leaflet.. The mitral valve is abnormal. Moderate to severe  mitral valve regurgitation. No evidence  of mitral stenosis.   6. The aortic valve is tricuspid. Aortic valve regurgitation is trivial.  Aortic valve sclerosis/calcification is present, without any evidence of  aortic stenosis. Aortic valve area, by VTI measures 1.81 cm. Aortic valve  mean gradient measures 3.0 mmHg.   Aortic valve Vmax measures 1.15 m/s.   7. The inferior vena cava is dilated in size with <50% respiratory  variability, suggesting right atrial pressure of 15 mmHg.   Laboratory Data:  High Sensitivity Troponin:   Recent Labs  Lab 03/31/23 0706 03/31/23 0930 04/05/23 1820 04/05/23 2050  TROPONINIHS 45* 40* 75* 76*     Chemistry Recent Labs  Lab 04/02/23 0154 04/03/23 0241 04/06/23 0058 04/06/23 0729 04/06/23 0751 04/06/23 1509  NA 138   < > 136 140 139 142  K 4.6   < > 6.0* 4.8 5.0 4.5  CL 102   < > 97* 97*  --  99  CO2 25   < > 11* 25  --  29  GLUCOSE 135*   < > 260* 148*  --  92  BUN 39*   < > 62* 62*  --  62*  CREATININE 2.85*   < > 4.22* 4.10*  --  4.08*  CALCIUM 9.2   < > 9.6 9.5  --  9.2  MG 1.9  --  2.4  --   --   --   GFRNONAA 16*   < > 10* 10*  --  10*  ANIONGAP 11   < > 28* 18*  --  14   < > = values in this interval not displayed.    Recent Labs  Lab 04/01/23 0359 04/05/23 1820 04/06/23 1509  PROT 6.3* 7.2 6.2*  ALBUMIN 3.2* 3.7 3.4*  AST 41 804* 2,195*  ALT 61* 723* 1,643*  ALKPHOS 204* 262* 212*  BILITOT 0.7 2.7* 1.8*   Lipids No results for input(s): "CHOL", "TRIG", "HDL", "LABVLDL", "LDLCALC", "CHOLHDL" in the last 168 hours.  Hematology Recent Labs  Lab 04/03/23 0241 04/05/23 1820 04/05/23 1839 04/05/23 2125 04/06/23 0058 04/06/23 0751  WBC 5.5 6.5  --   --  12.2*  --   RBC 3.99 4.68  --   --  4.02  --   HGB 11.7* 13.7   < > 14.3 11.9* 12.9  HCT 38.5 47.1*   < > 42.0 40.7 38.0  MCV 96.5 100.6*  --   --   101.2*  --   MCH 29.3 29.3  --   --  29.6  --   MCHC 30.4 29.1*  --   --  29.2*  --   RDW 15.2 15.8*  --   --  15.8*  --   PLT 234 251  --   --  215  --    < > = values in this interval not displayed.   Thyroid No results for input(s): "TSH", "FREET4" in the last 168 hours.  BNP Recent Labs  Lab 03/31/23 0706 04/05/23 1820 04/06/23 0058  BNP 1,775.0* 2,550.1* 2,843.8*    DDimer No results for input(s): "DDIMER" in the last 168 hours.   Radiology/Studies:  US Abdomen Limited RUQ (LIVER/GB)  Result Date: 04/06/2023 CLINICAL DATA:  Acute cholecystitis. EXAM: ULTRASOUND ABDOMEN LIMITED RIGHT UPPER QUADRANT COMPARISON:  None Available. FINDINGS: Gallbladder: No gallstones or wall thickening visualized. No sonographic Murphy sign noted by sonographer. Common bile duct: Diameter: 3.5 mm Liver: Within normal limits in parenchymal echogenicity. 0.3 x 1.3 x 1.8 cm hypoechoic mass in the subcapsular left lobe of the liver with appearance of a cyst. No internal blood flow. Portal vein is patent on color Doppler imaging with normal direction  of blood flow towards the liver. Other: Right pleural effusion. IMPRESSION: Right pleural effusion. No evidence of acute cholecystitis or cholelithiasis. 1.8 cm benign-appearing subcapsular cyst in the left lobe of the liver. Electronically Signed   By: Ted Mcalpine M.D.   On: 04/06/2023 15:55   ECHOCARDIOGRAM COMPLETE  Result Date: 04/06/2023    ECHOCARDIOGRAM REPORT   Patient Name:   Natasha Woodard Date of Exam: 04/06/2023 Medical Rec #:  [id number]         Height:       62.0 in Accession #:    [id number]        Weight:       155.4 lb Date of Birth:  08-08-1941         BSA:          1.717 m Patient Age:    81 years          BP:           118/76 mmHg Patient Gender: F                 HR:           71 bpm. Exam Location:  Inpatient Procedure: 2D Echo, Cardiac Doppler and Color Doppler Indications:    CHF  History:        Patient has prior history of  Echocardiogram examinations, most                 recent 01/18/2023. CHF, Previous Myocardial Infarction, Mitral                 Valve Disease; Risk Factors:Diabetes, Dyslipidemia and                 Hypertension.  Sonographer:    Milbert Coulter Referring Phys: [id number] LAURA P CLARK IMPRESSIONS  1. Left ventricular ejection fraction, by estimation, is <20%. The left ventricle has severely decreased function. The left ventricle demonstrate severe global hypokinesis and regional wall motion abnormalities (see scoring diagram/findings for description). The left ventricular internal cavity size was mildly dilated. Left ventricular diastolic parameters are consistent with Grade III diastolic dysfunction (restrictive). Elevated left ventricular end-diastolic pressure. There is akinesis of the left ventricular, mid-apical lateral wall and inferolateral wall. There is akinesis of the left ventricular, apical segment.  2. Right ventricular systolic function is moderately reduced. The right ventricular size is normal. There is severely elevated pulmonary artery systolic pressure. The estimated right ventricular systolic pressure is 72.5 mmHg.  3. Left atrial size was severely dilated.  4. Right atrial size was mildly dilated.  5. Based on eccentricity of MR and akinesis of the inferolateral and lateral walls, suspect this is ischemic MR due to restricted posterior mitral valve leaflet.. The mitral valve is abnormal. Moderate to severe mitral valve regurgitation. No evidence of mitral stenosis.  6. The aortic valve is tricuspid. Aortic valve regurgitation is trivial. Aortic valve sclerosis/calcification is present, without any evidence of aortic stenosis. Aortic valve area, by VTI measures 1.81 cm. Aortic valve mean gradient measures 3.0 mmHg.  Aortic valve Vmax measures 1.15 m/s.  7. The inferior vena cava is dilated in size with <50% respiratory variability, suggesting right atrial pressure of 15 mmHg. FINDINGS  Left Ventricle:  Left ventricular ejection fraction, by estimation, is <20%. The left ventricle has severely decreased function. The left ventricle demonstrates regional wall motion abnormalities. The left ventricular internal cavity size was mildly dilated. There is no left ventricular hypertrophy. Left ventricular diastolic parameters  are consistent with Grade III diastolic dysfunction (restrictive). Elevated left ventricular end-diastolic pressure. Right Ventricle: The right ventricular size is normal. No increase in right ventricular wall thickness. Right ventricular systolic function is moderately reduced. There is severely elevated pulmonary artery systolic pressure. The tricuspid regurgitant velocity is 3.79 m/s, and with an assumed right atrial pressure of 15 mmHg, the estimated right ventricular systolic pressure is 72.5 mmHg. Left Atrium: Left atrial size was severely dilated. Right Atrium: Right atrial size was mildly dilated. Pericardium: There is no evidence of pericardial effusion. Mitral Valve: Based on eccentricity of MR and akinesis of the inferolateral and lateral walls, suspect this is ischemic MR due to restricted posterior mitral valve leaflet. The mitral valve is abnormal. Moderate to severe mitral valve regurgitation, with  eccentric posteriorly directed jet. No evidence of mitral valve stenosis. Tricuspid Valve: The tricuspid valve is normal in structure. Tricuspid valve regurgitation is mild . No evidence of tricuspid stenosis. Aortic Valve: The aortic valve is tricuspid. Aortic valve regurgitation is trivial. Aortic valve sclerosis/calcification is present, without any evidence of aortic stenosis. Aortic valve mean gradient measures 3.0 mmHg. Aortic valve peak gradient measures 5.3 mmHg. Aortic valve area, by VTI measures 1.81 cm. Pulmonic Valve: The pulmonic valve was normal in structure. Pulmonic valve regurgitation is not visualized. No evidence of pulmonic stenosis. Aorta: The aortic root is normal in  size and structure. Venous: The inferior vena cava is dilated in size with less than 50% respiratory variability, suggesting right atrial pressure of 15 mmHg. IAS/Shunts: No atrial level shunt detected by color flow Doppler. Additional Comments: A device lead is visualized.  LEFT VENTRICLE PLAX 2D LVIDd:         5.60 cm      Diastology LVIDs:         5.30 cm      LV e' medial:    4.24 cm/s LV PW:         0.90 cm      LV E/e' medial:  34.0 LV IVS:        1.00 cm      LV e' lateral:   8.49 cm/s LVOT diam:     2.00 cm      LV E/e' lateral: 17.0 LV SV:         38 LV SV Index:   22 LVOT Area:     3.14 cm  LV Volumes (MOD) LV vol d, MOD A2C: 141.0 ml LV vol d, MOD A4C: 115.0 ml LV vol s, MOD A2C: 97.1 ml LV vol s, MOD A4C: 110.0 ml LV SV MOD A2C:     43.9 ml LV SV MOD A4C:     115.0 ml LV SV MOD BP:      22.3 ml RIGHT VENTRICLE RV Basal diam:  3.90 cm RV Mid diam:    3.10 cm RV S prime:     9.36 cm/s TAPSE (M-mode): 1.9 cm LEFT ATRIUM              Index        RIGHT ATRIUM           Index LA diam:        5.30 cm  3.09 cm/m   RA Area:     19.60 cm LA Vol (A2C):   118.0 ml 68.71 ml/m  RA Volume:   50.80 ml  29.58 ml/m LA Vol (A4C):   109.0 ml 63.47 ml/m LA Biplane Vol: 114.0 ml 66.38 ml/m  AORTIC  VALVE AV Area (Vmax):    2.33 cm AV Area (Vmean):   2.09 cm AV Area (VTI):     1.81 cm AV Vmax:           115.00 cm/s AV Vmean:          75.600 cm/s AV VTI:            0.212 m AV Peak Grad:      5.3 mmHg AV Mean Grad:      3.0 mmHg LVOT Vmax:         85.40 cm/s LVOT Vmean:        50.300 cm/s LVOT VTI:          0.122 m LVOT/AV VTI ratio: 0.58 MITRAL VALVE                  TRICUSPID VALVE MV Area (PHT): 5.02 cm       TR Peak grad:   57.5 mmHg MV Decel Time: 151 msec       TR Vmax:        379.00 cm/s MR Peak grad:    131.3 mmHg MR Mean grad:    86.0 mmHg    SHUNTS MR Vmax:         573.00 cm/s  Systemic VTI:  0.12 m MR Vmean:        444.0 cm/s   Systemic Diam: 2.00 cm MR PISA:         4.02 cm MR PISA Eff ROA: 16 mm MR PISA  Radius:  0.80 cm MV E velocity: 144.00 cm/s MV A velocity: 50.80 cm/s MV E/A ratio:  2.83 Armanda Magic MD Electronically signed by Armanda Magic MD Signature Date/Time: 04/06/2023/3:50:45 PM    Final    CT ABDOMEN PELVIS WO CONTRAST  Result Date: 04/06/2023 CLINICAL DATA:  Sepsis, known E coli gastroenteritis, lactic acidosis, concern for heart failure. EXAM: CT ABDOMEN AND PELVIS WITHOUT CONTRAST TECHNIQUE: Multidetector CT imaging of the abdomen and pelvis was performed following the standard protocol without IV contrast. RADIATION DOSE REDUCTION: This exam was performed according to the departmental dose-optimization program which includes automated exposure control, adjustment of the mA and/or kV according to patient size and/or use of iterative reconstruction technique. COMPARISON:  03/30/2023. FINDINGS: Lower chest: Heart is enlarged and pacemaker leads are present in the heart. Coronary artery calcifications are present. There is a small right pleural effusion with atelectasis at the lung bases. Hepatobiliary: A stable 1.4 cm hypodensity is noted in the left lobe of the liver, previously characterized of the cyst. No biliary ductal dilatation. Hyperdense material is present in the gallbladder, possible stones, sludge, or excreted contrast Pancreas: Unremarkable. No pancreatic ductal dilatation or surrounding inflammatory changes. Spleen: Normal in size without focal abnormality. Adrenals/Urinary Tract: There is a stable 2.5 cm left adrenal nodule, previously described as probable myelolipoma and not well evaluated on this exam. The right adrenal gland is within normal limits. No renal calculus or hydronephrosis. The bladder is unremarkable. Stomach/Bowel: Stomach is within normal limits. Appendix appears normal. No bowel obstruction, free air or pneumatosis. Scattered diverticula are present along the colon with mild thickening of the walls of the sigmoid colon. Vascular/Lymphatic: Aortic atherosclerosis.  No enlarged abdominal or pelvic lymph nodes. Reproductive: Status post hysterectomy. No adnexal masses. Other: Mesenteric fat stranding is noted with a small amount of free fluid in the presacral space and pelvis. Anasarca is noted. A fat containing hernia is noted in the mid anterior abdomen superior to  the umbilicus. Musculoskeletal: Degenerative changes are present in the thoracolumbar spine. No acute osseous abnormality. IMPRESSION: 1. Sigmoid diverticulosis with bowel wall thickening, possible mild diverticulitis. 2. Small right pleural effusion with atelectasis at the lung bases. 3. Anasarca. 4. Small amount of free fluid in the pelvis. 5. Aortic atherosclerosis and coronary artery calcifications. 6. Hyperdense material in the gallbladder, possible stones, sludge, or excreted contrast. 7. Remaining incidental findings as described above. Electronically Signed   By: Thornell Sartorius M.D.   On: 04/06/2023 00:56   DG Chest Port 1 View  Result Date: 04/05/2023 CLINICAL DATA:  Central line adjustment EXAM: PORTABLE CHEST 1 VIEW COMPARISON:  04/05/2023 FINDINGS: Right central line in place with the tip remaining in the right atrium, approximately 4 cm deep to the cavoatrial junction. Left AICD is unchanged. Cardiomegaly with vascular congestion improving bibasilar opacities. IMPRESSION: Right central line tip remains in the mid right atrium approximately 4 cm deep to the cavoatrial junction. Cardiomegaly, vascular congestion, improving bibasilar opacities. Electronically Signed   By: Charlett Nose M.D.   On: 04/05/2023 22:23   DG Chest Portable 1 View  Result Date: 04/05/2023 CLINICAL DATA:  Central line placement EXAM: PORTABLE CHEST 1 VIEW COMPARISON:  04/05/2023 FINDINGS: Right central line is been placed with the tip in the right atrium. Left AICD remains in place, unchanged. Cardiomegaly, vascular congestion. Bibasilar opacities could reflect atelectasis or edema. No effusions or acute bony abnormality.  IMPRESSION: Right central line tip in the right atrium.  No pneumothorax. Cardiomegaly, vascular congestion bibasilar atelectasis or edema. Electronically Signed   By: Charlett Nose M.D.   On: 04/05/2023 22:22   US Renal  Result Date: 04/05/2023 CLINICAL DATA:  Renal failure. EXAM: RENAL / URINARY TRACT ULTRASOUND COMPLETE COMPARISON:  None Available. FINDINGS: Right Kidney: Renal measurements: 10.2 cm x 4.2 cm x 3.6 cm = volume: 80.88 mL. Echogenicity within normal limits. No mass or hydronephrosis visualized. Left Kidney: Renal measurements: 9.7 cm x 5.6 cm x 5.0 cm = volume: 141.77 mL. Echogenicity within normal limits. No mass or hydronephrosis visualized. Bladder: Empty and subsequently limited in evaluation. Other: Of incidental note is the presence of a right pleural effusion. IMPRESSION: 1. Unremarkable ultrasonographic appearance of the kidneys. 2. Right pleural effusion. Electronically Signed   By: Aram Candela M.D.   On: 04/05/2023 20:12   DG Chest Port 1 View  Result Date: 04/05/2023 CLINICAL DATA:  Abdominal pain, nausea and vomiting, tachypnea EXAM: PORTABLE CHEST 1 VIEW COMPARISON:  01/24/2023 FINDINGS: Single frontal view of the chest demonstrates an enlarged cardiac silhouette. Stable dual lead pacer/AICD. Increasing central vascular congestion, with bilateral perihilar airspace disease. Trace bilateral pleural effusions. No pneumothorax. IMPRESSION: 1. Constellation of findings favoring congestive heart failure and mild pulmonary edema. Superimposed infection cannot be excluded. Electronically Signed   By: Sharlet Salina M.D.   On: 04/05/2023 19:44     Assessment and Plan:   Acute on chronic systolic heart failure  -Very challenging situation, ejection fraction less than 20%.  Previous left and right heart cath in January had low output heart failure with cardiogenic shock requiring milrinone and midodrine, was able to wean off prior to discharge.  She has not required pressor  support during this admission.  She has evidence of liver injury, her skin is cool to touch, although coox remain in the 70s.  It is possible patient has ischemic bowel, will discuss with MD.  Options quite limited from the cardiac perspective.  Unclear if milrinone will help  with the patient with normal coox.  Echocardiogram also indicated significant RV volume overload with RV systolic pressure in the 70s.  She is at risk for decompensation.  Abdominal discomfort: Per primary team.  CT of abdomen and pelvis without contrast showed sigmoid diverticulosis with bowel thickening, possibly mild diverticulitis, anasarca, small amount of free fluid in the pelvis.  Acute on chronic renal insufficiency: Baseline creatinine 1.5 to-1.8, creatinine on arrival greater than 4.  Followed by nephrology service.  Urine output has not been great. Only .   Liver injury: AST 2195, ALT 1643.  CAD: Underwent PCI of left circumflex artery in January 2024.  Not on aspirin due to the need for Eliquis, continue Plavix  Hypertension: Off of all heart failure and blood pressure medications due to concern for low output heart failure.  Hyperlipidemia  DM2  Obstructive sleep apnea  PAF: On low-dose 2.5 mg twice a day Eliquis.  Maintaining sinus rhythm on amiodarone  [employer] ICD 01/24/2023: Placed after VT arrest.  No further episode.  On amiodarone 200 mg daily   Risk Assessment/Risk Scores:  New York Heart Association (NYHA) Functional Class NYHA Class III  CHA2DS2-VASc Score = 7   This indicates a 11.2% annual risk of stroke. The patient's score is based upon: CHF History: 1 HTN History: 1 Diabetes History: 1 Stroke History: 0 Vascular Disease History: 1 Age Score: 2 Gender Score: 1   For questions or updates, please contact [facility] Please consult [email or web address] for contact info under  Signed, Azalee Course, [place]  04/06/2023 5:41 PM  Patient seen and examined, note reviewed with  the signed Advanced Practice Provider. I personally reviewed laboratory data, imaging studies and relevant notes. I independently examined the patient and formulated the important aspects of the plan. I have personally discussed the plan with the patient and/or family. Comments or changes to the note/plan are indicated below.  Patient seen and examined at her bedside. Family at bedside.   GEN:  Well nourished, well developed in no acute distress HEENT: Mucous membranes moist NECK: No JVD; No carotid bruits LYMPHATICS: No lymphadenopathy CARDIAC: S1S2 noted, RRR, no murmurs, rubs, gallops RESPIRATORY:  Clear to auscultation without rales, wheezing or rhonchi  ABDOMEN: Soft, non-tender, non-distended, bowel sounds noted, no guarding EXTREMITIES:No cyanosis, no cyanosis, no clubbing MUSCULOSKELETAL: No deformity  SKIN: skin tugor, warm and dry NEUROLOGIC:  Alert and oriented x 3, nonfocal PSYCHIATRIC:  Normal affect, good insight   AKI on Severe on CKD  Acute hypoxic respiratory failure  CAD s/p recent NSTEMI PAF  Electrolyte Abnormalities  Her cr is worsening, likely due to pre-renal azotemia from poor perfusion in the setting of her severely depressed ejection fraction, Progressing to ATN. For now agree with holding the lasix and monitor closely.  Her CVP is high (29), No IVF for now. Will also fluid restrict oral ( 1200 ml).   Vitals stable,  coox in the 70s, monitor closely no indication to pursue clinical treatment for cardiogenic shock.   CAD - no anginal   LFT elevated  Per primary team.  CT of abdomen and pelvis without contrast showed sigmoid diverticulosis with bowel thickening, possibly mild diverticulitis, anasarca, small amount of free fluid in the pelvis.  She is in sinus rhythm.   Will continue to follow with you.  Thomasene Ripple DO, MS [facility] Attending Cardiologist [facility]  [address] [place], [place] [address] [phone] Website:  [email or web address]

## 2023-04-06 NOTE — Progress Notes (Addendum)
NAME:  Natasha Woodard, MRN:  [id number], DOB:  1941-09-05, LOS: 1 ADMISSION DATE:  04/05/2023, CONSULTATION DATE:  2/24 REFERRING MD:  [facility]app-ED, CHIEF COMPLAINT:  metabolic acidosis   History of Present Illness:  [name] is an 82 y/o woman with a history of NSTEMI and cardiogenic shock with a complicated and prolonged admission earlier this year,  HFrEF, OSA, HTN, HLD who presented with recurrent abdominal symptoms with early satiety, distention, diffuse pain throughout her abdomen, nausea, and vomiting. She had a recent admission and was discharged on 04/03/23 with ETEC gastroenteritis. She has a son who has been sick at home for a few days as well, but her daughter is unsure of what symptoms he has been having. She has been taking her meds since discharge, but did not take most of them this morning due to nausea. She has noticed more swelling, but cannot give a timeline of that.   Pertinent  Medical History  Recent ETEC gastroenteritis NSTEMI, cardiogenic shock- previous prolonged admission 1/30- 02/19/23 HFrEF, AICD in place OSA Pseudoaneurysm, s/p repair & debridements R groin  Significant Hospital Events: Including procedures, antibiotic start and stop dates in addition to other pertinent events   4/24 admitted, CVC placed  Interim History / Subjective:  Stable currently  Objective   Blood pressure 115/74, pulse 70, temperature 97.6 F (36.4 C), temperature source Axillary, resp. rate 20, height 5\' 2"  (1.575 m), weight 70.5 kg, SpO2 100 %.    FiO2 (%):  [40 %-80 %] 40 %   Intake/Output Summary (Last 24 hours) at 04/06/2023 0820 Last data filed at 04/06/2023 4098 Gross per 24 hour  Intake 100 ml  Output --  Net 100 ml   Filed Weights   04/05/23 1717  Weight: 70.5 kg    Examination: 82 year old female who is on noninvasive mechanical ventilatory support but awake and alert No JVD is appreciated Decreased breath sounds throughout Heart sounds are regular paced at 72  bpm Abdomen is distended tender Lower extremities with edema Has not voided as of yet bladder scan is planned will place Foley if needed  Resolved Hospital Problem list     Assessment & Plan:  Severe metabolic acidosis, lactic acidosis. Concern for decompensated heart failure causing this.  Concern for acute on chronic HFrEF with cardiogenic shock Bicarbonate drip per nephrology Central line is noted in place Questionable diuresis Repeat 2D echo Hold beta-blockers  UTI Pseudomonas Enterococcus Currently on Zithromax and cefepime    ETEC diarrhea Empirical antimicrobial therapy  History of Afib Anti-coagulation with Eliquis Amiodarone     Abdominal distention, pain CT of the abdomen with  Pre-DM; hypoglycemic today Sliding-scale insulin protocol  AKI on CKD IV, worry about cardiorenal syndrome vs HUS-type syndrome from ETEC (although she did not have the shiga-toxin producing variety) Hyperkalemia Recent Labs  Lab 04/06/23 0058 04/06/23 0729 04/06/23 0751  K 6.0* 4.8 5.0   Lab Results  Component Value Date   CREATININE 4.10 (H) 04/06/2023   CREATININE 4.22 (H) 04/06/2023   CREATININE 4.40 (H) 04/05/2023   CREATININE 1.81 (H) 03/24/2023   CREATININE 1.28 (H) 01/14/2022   Nephrology input appreciated Currently on a bicarb infusion as per   continue to monitor lab Holding Aldactone and ARB    Hypocalcemia Replete as needed  Elevated transaminases, hyperbilirubinemia- unclear if related to heart or from diarrhea Continue to monitor Dc statins   Constipation Rectal suppository  Family updated at bedside. See separate ipal note. DNR; family not in agreement with  patient.  Per Dr. Chestine Spore 04/05/2023   Best Practice (right click and "Reselect all SmartList Selections" daily)   Diet/type: NPO w/ oral meds DVT prophylaxis: DOAC GI prophylaxis: N/A Lines: Central line Foley:  Yes, and it is still needed Code Status:  limited Last date of  multidisciplinary goals of care discussion [4/24 in ED with patient and family] 04/06/2023 husband updated at bedside she is listed as a DNR and family is in disagreement with the patient concerning CODE STATUS.  She is currently on noninvasive mechanical ventilatory support and a bicarbonate drip  Labs   CBC: Recent Labs  Lab 03/30/23 1845 04/01/23 0359 04/02/23 0154 04/03/23 0241 04/05/23 1820 04/05/23 1839 04/05/23 2125 04/06/23 0058  WBC 4.6 4.8 4.5 5.5 6.5  --   --  12.2*  NEUTROABS 2.6  --   --   --  5.4  --   --   --   HGB 11.6* 11.9* 11.7* 11.7* 13.7 15.6*  16.0* 14.3 11.9*  HCT 37.3 37.9 36.0 38.5 47.1* 46.0  47.0* 42.0 40.7  MCV 96.1 95.2 93.3 96.5 100.6*  --   --  101.2*  PLT 242 246 228 234 251  --   --  215     Basic Metabolic Panel: Recent Labs  Lab 04/02/23 0154 04/03/23 0241 04/05/23 1820 04/05/23 1839 04/05/23 2125 04/06/23 0058 04/06/23 0729  NA 138 139 139 136  137 136 136 140  K 4.6 4.8 6.0* 5.8*  5.8* 5.1 6.0* 4.8  CL 102 106 99 105  --  97* 97*  CO2 25 24 11*  --   --  11* 25  GLUCOSE 135* 105* 62* 54*  --  260* 148*  BUN 39* 39* 57* 54*  --  62* 62*  CREATININE 2.85* 2.48* 4.29* 4.40*  --  4.22* 4.10*  CALCIUM 9.2 9.1 10.2  --   --  9.6 9.5  MG 1.9  --   --   --   --  2.4  --   PHOS  --   --   --   --   --  7.4*  --     GFR: Estimated Creatinine Clearance: 9.7 mL/min (A) (by C-G formula based on SCr of 4.1 mg/dL (H)). Recent Labs  Lab 03/31/23 0706 04/01/23 0359 04/02/23 0154 04/03/23 0241 04/05/23 1820 04/05/23 2050 04/05/23 2255 04/06/23 0058  PROCALCITON 0.37  --   --   --   --   --   --  0.81  WBC  --    < > 4.5 5.5 6.5  --   --  12.2*  LATICACIDVEN  --   --   --   --  >9.0* >9.0* >9.0* >9.0*   < > = values in this interval not displayed.     Liver Function Tests: Recent Labs  Lab 03/30/23 1845 04/01/23 0359 04/05/23 1820  AST 54* 41 804*  ALT 64* 61* 723*  ALKPHOS 212* 204* 262*  BILITOT 0.5 0.7 2.7*  PROT 6.3*  6.3* 7.2  ALBUMIN 3.3* 3.2* 3.7    Recent Labs  Lab 03/30/23 1845 04/05/23 1820  LIPASE 33 28    No results for input(s): "AMMONIA" in the last 168 hours.  ABG    Component Value Date/Time   PHART 7.361 01/10/2023 1942   PCO2ART 43.9 01/10/2023 1942   PO2ART 87 01/10/2023 1942   HCO3 9.3 (L) 04/05/2023 2125   TCO2 10 (L) 04/05/2023 2125   ACIDBASEDEF 20.0 (H) 04/05/2023 2125  O2SAT 72.7 04/05/2023 2255     Coagulation Profile: No results for input(s): "INR", "PROTIME" in the last 168 hours.  Cardiac Enzymes: No results for input(s): "CKTOTAL", "CKMB", "CKMBINDEX", "TROPONINI" in the last 168 hours.  HbA1C: Hgb A1c MFr Bld  Date/Time Value Ref Range Status  03/31/2023 09:30 AM 5.6 4.8 - 5.6 % Final    Comment:    (NOTE) Pre diabetes:          5.7%-6.4%  Diabetes:              >6.4%  Glycemic control for   <7.0% adults with diabetes   03/13/2023 03:18 PM 5.2 4.6 - 6.5 % Final    Comment:    Glycemic Control Guidelines for People with Diabetes:Non Diabetic:  <6%Goal of Therapy: <7%Additional Action Suggested:  >8%     CBG: Recent Labs  Lab 04/06/23 0009 04/06/23 0011 04/06/23 0106 04/06/23 0501 04/06/23 0816  GLUCAP 216* 207* 240* 182* 139*

## 2023-04-06 NOTE — ED Notes (Addendum)
ED TO INPATIENT HANDOFF REPORT  ED Nurse Name and Phone #: [phone]  S Name/Age/Gender Natasha Woodard 82 y.o. female Room/Bed: 027C/027C  Code Status   Code Status: DNR  Home/SNF/Other Home Patient oriented to: self, place, time, and situation Is this baseline? Yes   Triage Complete: Triage complete  Chief Complaint Acute exacerbation of CHF (congestive heart failure) [I50.9]  Triage Note Pt coming from home having abdominal pain with nausea and vomiting that has worsened in the last 2 to 3 hours. Was seen two days ago. Zofran not working  122/64 74 96 Room air 108    Allergies Allergies  Allergen Reactions   Ezetimibe-Simvastatin Other (See Comments)    REACTION: Muscle aches (side effect)   Lipitor [Atorvastatin] Other (See Comments)    Leg weakness    Claritin [Loratadine]     Possible cause of nightmares.     Spironolactone     Caution re: elevated potassium/creatinine   Tamiflu [Oseltamivir Phosphate] Other (See Comments)    nightmares   Pravastatin Sodium Other (See Comments)    REACTION: Muscle aches (side effect)   Sulfonamide Derivatives Nausea And Vomiting    Level of Care/Admitting Diagnosis ED Disposition     ED Disposition  Admit   Condition  --   Comment  Hospital Area: [facility] [[id number]]  Level of Care: ICU [6]  May admit patient to [facility] or [facility] if equivalent level of care is available:: No  Covid Evaluation: Asymptomatic - no recent exposure (last 10 days) testing not required  Diagnosis: Acute exacerbation of CHF (congestive heart failure) [[id number]]  Admitting Physician: PCCM, MD [id number]  Attending Physician: Steffanie Dunn [[id number]]  Certification:: I certify there are rare and unusual circumstances requiring inpatient admission  Estimated Length of Stay: 5          B Medical/Surgery History Past Medical History:  Diagnosis Date   AICD (automatic cardioverter/defibrillator) present    Calcific  tendonitis 2008   Treatment of left leg   Cardiomyopathy 06/2017   a) Echo: EF 25-30%. GR 1-2 DD w/ elevated LVEDP. Mild valvular Dz; b) Cardiac MRI 2/'19: frequent PVCs (diffiuclt to interpret) - EF ~27% w/ diffuse HK.  No evidence of infarct, infiltrative Dz or myocarditis. -- ? if related to PVCs. c) f/u Echo 6/'19: EF 35-40%. Gr 1 DD. Diffuse HK.-> d) Jan 2020 EF 50 to 55%.  Mod MAC, mod LAE.  Ao Sclerosis; e) Echo 1/'21 - EF 60-65%, Gr II DD. Mild Mod MR.    Chronic combined systolic and diastolic CHF, NYHA class 2 and ACA/AHA stage C    Now essentially resolved, back to simply diastolic CHF   Coronary artery disease, non-occlusive    mild-moderate CAD 06/30/17 cath   CTS (carpal tunnel syndrome)    Diabetes mellitus    Type II   Dysrhythmia    patient said that she cant remember what it is   Frequent unifocal PVCs 01/2018   Event Monitor: NSR, W/ frequent multifocal PVCs (9%-down from 30% prior to amiodarone) and PACs.  Nighttime bradycardia suggestive of OSA.';  Zio patch September 2023: PVC burden now 6.1%.   Hiatal hernia    with reflux   Hyperlipidemia    Statin intolerant   Hypertension    Ichthyosis congenita    Intermittent claudication of both lower extremities due to atherosclerosis 08/22/2021   LEA Dopplers 09/09/2021:  Right: Total occlusion noted in the superficial femoral artery. Atherosclerosis noted throughout extremity,  see note above. Three vessel runoff.  Left: Total occlusion noted in the superficial femoral artery and/or popliteal artery. Total occlusion noted in the distal anterior tibial artery. Athereosclerosis noted throughout extremity.    NSVD (normal spontaneous vaginal delivery) 1971 & 1972   Obesity    Plantar fasciitis    Left   Presence of permanent cardiac pacemaker    Pulmonary nodule    Imaged multiple times and benign appearing   Sleep apnea    Wears glasses    Past Surgical History:  Procedure Laterality Date   ABDOMINAL HYSTERECTOMY  1975    TAH-BSO   ABSCESS DRAINAGE  1972   right breast   APPLICATION OF WOUND VAC Right 01/15/2023   Procedure: APPLICATION OF INCISIONAL WOUND VAC;  Surgeon: Victorino Sparrow, MD;  Location: [facility] OR;  Service: Vascular;  Laterality: Right;   APPLICATION OF WOUND VAC Right 01/26/2023   Procedure: APPLICATION OF WOUND VAC RIGHT GROIN WASHOUT AND SARTORIUS MUSCLE FLAP;  Surgeon: Victorino Sparrow, MD;  Location: [facility] OR;  Service: Vascular;  Laterality: Right;   ARTERY REPAIR Right 01/15/2023   Procedure: RIGHT PROFUNDA ARTERY REPAIR WITH 2000G MYRIAD MORCELLS;  Surgeon: Victorino Sparrow, MD;  Location: [facility] OR;  Service: Vascular;  Laterality: Right;   BREAST BIOPSY Left 01/14/2009   Stereo- Benign   CARDIAC MRI  01/2018   Difficult to interpret 2/2 PVCs. Normal LV size - EF ~27% with diffuse HK. Mild RV dilation - normal fxn. -- NO MYOCARDIAL LGI => no definitive evidence of prior MI, Infiltrative Dz or Myocarditis -- suspect NICM, possibly related to PVCs.   CARPAL TUNNEL RELEASE Right 07/02/2014   Procedure: RIGHT CARPAL TUNNEL RELEASE;  Surgeon: Nicki Reaper, MD;  Location: [facility];  Service: Orthopedics;  Laterality: Right;   CARPAL TUNNEL RELEASE Left 06/11/2015   Procedure: LEFT CARPAL TUNNEL RELEASE;  Surgeon: Cindee Salt, MD;  Location: [facility];  Service: Orthopedics;  Laterality: Left;  REGIONAL/FAB   COLONOSCOPY     corn removal  1968   right   CORONARY STENT INTERVENTION N/A 01/10/2023   Procedure: CORONARY STENT INTERVENTION;  Surgeon: Lennette Bihari, MD;  [facility];  Service: Cardiovascular;  Laterality: N/A;   GROIN DEBRIDEMENT Right 02/06/2023   Procedure: RIGHT GROIN DEBRIDEMENT WITH WOUND VAC CHANGE;  Surgeon: Victorino Sparrow, MD;  Location: [facility] OR;  Service: Vascular;  Laterality: Right;   HEMATOMA EVACUATION Right 01/15/2023   Procedure: EVACUATION HEMATOMA RIGHT GROIN;  Surgeon: Victorino Sparrow, MD;  Location: [facility] OR;  Service: Vascular;   Laterality: Right;   Holter Monitor  06/2017   ~17,000 PVC beats - majority were singlets, some couplets. 44 brief 3-7 beat runs of NSVT. Also noted were less frequent PACs with 11 runs (longest 15 beats)   IABP INSERTION Right 01/10/2023   Procedure: IABP Insertion;  Surgeon: Lennette Bihari, MD;  Location: [facility] INVASIVE CV LAB;  Service: Cardiovascular;  Laterality: Right;   ICD IMPLANT N/A 01/23/2023   Procedure: ICD IMPLANT;  Surgeon: Marinus Maw, MD;  Location: [facility] INVASIVE CV LAB;  Service: Cardiovascular;  Laterality: N/A;   INCISION AND DRAINAGE OF WOUND Right 01/31/2023   Procedure: IRRIGATION AND DEBRIDEMENT RIGHT GROIN WOUND AND WOUND VAC CHANGE;  Surgeon: Victorino Sparrow, MD;  Location: [facility] OR;  Service: Vascular;  Laterality: Right;   OPEN REDUCTION INTERNAL FIXATION (ORIF) DISTAL RADIAL FRACTURE Left 07/21/2017   Procedure: OPEN REDUCTION INTERNAL  FIXATION (ORIF) LEFT DISTAL RADIAL FRACTURE;  Surgeon: Sheral Apley, MD;  Location: [facility] OR;  Service: Orthopedics;  Laterality: Left;   RIGHT/LEFT HEART CATH AND CORONARY ANGIOGRAPHY N/A 06/30/2017   Procedure: Right/Left Heart Cath and Coronary Angiography;  Surgeon: Marykay Lex, MD;  Location: [facility] INVASIVE CV LAB:  pRCA 55%, pCx 40%, OM1 45%, mCx 50%, D2 50% - LVEF 25-35%. Moderately elevated LVEDP(26 mmHg with PCWP 16 mmHg).  FICK CO/CI: 4.47/2.48. PA pressures 47/14 mmHg with a mean of 27 mm.   RIGHT/LEFT HEART CATH AND CORONARY ANGIOGRAPHY N/A 01/10/2023   Procedure: RIGHT/LEFT HEART CATH AND CORONARY ANGIOGRAPHY;  Surgeon: Lennette Bihari, MD;  [facility];  Service: Cardiovascular;  Laterality: N/A;   ROTATOR CUFF REPAIR     left shoulder   TEMPORARY PACEMAKER N/A 01/19/2023   Procedure: TEMPORARY PACEMAKER;  Surgeon: Dolores Patty, MD;  [facility] INV[facility];  Service: Cardiovascular;  Laterality: N/A;   TRANSTHORACIC ECHOCARDIOGRAM  06/2017   EF 25 and 30%. GR 1 DD. Mild diastolic dysfunction with  elevated LVEDP. Mild valvular disease.   TRANSTHORACIC ECHOCARDIOGRAM  11'18, 6/'19    a) EF remains 30-35%.  Diffuse hypokinesis noted.  Severe LA dilation; b) Improved EF 35-40%.  Diffuse HK.  GR 1 DD.  Moderate TR.  Mild RV dilation.   TRANSTHORACIC ECHOCARDIOGRAM  01/28/2022   Follow-up echo: EF 50 to 55%.  No RWMA.  GRII DD.  Severe LA dilation.  Mild RA dilation with normal PAP and RAP.  Mild to moderate TR (no significant change noted)   Zio patch  08/2022   3-day: Currently NSR (HR range 47-91 with average 64 bpm) 1 F AVB with IVCD/BBB.  Rare isolated PACs (<1%).  Frequent PVCs (6.1% with rare couplets and triplets.  Also bigeminy.  2 atrial runs 8 beats 5 2 seconds.  No sustained arrhythmias.   ZIO Patch 14 d Event Monitor  10/2018   Results as below < 1% PVC.  (Notably improved after starting amiodarone)     A IV Location/Drains/Wounds Patient Lines/Drains/Airways Status     Active Line/Drains/Airways     Name Placement date Placement time Site Days   Peripheral IV 04/05/23 20 G 1" Anterior;Proximal;Right Forearm 04/05/23  1842  Forearm  1   Peripheral IV 04/05/23 20 G 1.88" Right;Upper Arm 04/05/23  2049  Arm  1   CVC Triple Lumen 04/05/23 Right Internal jugular 04/05/23  2214  -- 1   Negative Pressure Wound Therapy Groin Right 02/06/23  1501  --  59            Intake/Output Last 24 hours  Intake/Output Summary (Last 24 hours) at 04/06/2023 1132 Last data filed at 04/06/2023 1610 Gross per 24 hour  Intake 100 ml  Output --  Net 100 ml    Labs/Imaging Results for orders placed or performed during the hospital encounter of 04/05/23 (from the past 48 hour(s))  Brain natriuretic peptide     Status: Abnormal   Collection Time: 04/05/23  6:20 PM  Result Value Ref Range   B Natriuretic Peptide 2,550.1 (H) 0.0 - 100.0 pg/mL    Comment: Performed at [facility] Lab, [address], [place], [place] [address]  CBC with Differential/Platelet     Status: Abnormal    Collection Time: 04/05/23  6:20 PM  Result Value Ref Range   WBC 6.5 4.0 - 10.5 K/uL   RBC 4.68 3.87 - 5.11 MIL/uL  Hemoglobin 13.7 12.0 - 15.0 g/dL   HCT 40.9 (H) 81.1 - 91.4 %   MCV 100.6 (H) 80.0 - 100.0 fL   MCH 29.3 26.0 - 34.0 pg   MCHC 29.1 (L) 30.0 - 36.0 g/dL   RDW 78.2 (H) 95.6 - 21.3 %   Platelets 251 150 - 400 K/uL   nRBC 0.5 (H) 0.0 - 0.2 %   Neutrophils Relative % 82 %   Neutro Abs 5.4 1.7 - 7.7 K/uL   Lymphocytes Relative 5 %   Lymphs Abs 0.4 (L) 0.7 - 4.0 K/uL   Monocytes Relative 12 %   Monocytes Absolute 0.8 0.1 - 1.0 K/uL   Eosinophils Relative 0 %   Eosinophils Absolute 0.0 0.0 - 0.5 K/uL   Basophils Relative 0 %   Basophils Absolute 0.0 0.0 - 0.1 K/uL   Immature Granulocytes 1 %   Abs Immature Granulocytes 0.04 0.00 - 0.07 K/uL    Comment: Performed at [facility] Lab, [address] [place], [place] [address]  Comprehensive metabolic panel     Status: Abnormal   Collection Time: 04/05/23  6:20 PM  Result Value Ref Range   Sodium 139 135 - 145 mmol/L   Potassium 6.0 (H) 3.5 - 5.1 mmol/L   Chloride 99 98 - 111 mmol/L   CO2 11 (L) 22 - 32 mmol/L   Glucose, Bld 62 (L) 70 - 99 mg/dL    Comment: Glucose reference range applies only to samples taken after fasting for at least 8 hours.   BUN 57 (H) 8 - 23 mg/dL   Creatinine, Ser 8.46 (H) 0.44 - 1.00 mg/dL   Calcium 96.2 8.9 - 95.2 mg/dL   Total Protein 7.2 6.5 - 8.1 g/dL   Albumin 3.7 3.5 - 5.0 g/dL   AST 841 (H) 15 - 41 U/L   ALT 723 (H) 0 - 44 U/L   Alkaline Phosphatase 262 (H) 38 - 126 U/L   Total Bilirubin 2.7 (H) 0.3 - 1.2 mg/dL   GFR, Estimated 10 (L) >60 mL/min    Comment: (NOTE) Calculated using the CKD-EPI Creatinine Equation (2021)    Anion gap 29 (H) 5 - 15    Comment: ELECTROLYTES REPEATED TO VERIFY Performed at [facility] Lab, [address] [place], [place] [address]   Lipase, blood     Status: None   Collection Time: 04/05/23  6:20 PM  Result Value Ref Range   Lipase 28 11 -  51 U/L    Comment: Performed at [facility] Lab, [address], [place], [place] [address]  Lactic acid, plasma     Status: Abnormal   Collection Time: 04/05/23  6:20 PM  Result Value Ref Range   Lactic Acid, Venous >9.0 (HH) 0.5 - 1.9 mmol/L    Comment: CRITICAL RESULT CALLED TO, READ BACK BY AND VERIFIED WITH C SWORD,RN 2021 04/05/23 WBOND Performed at [facility] Lab, [address] [place], [place] [address]   Troponin I (High Sensitivity)     Status: Abnormal   Collection Time: 04/05/23  6:20 PM  Result Value Ref Range   Troponin I (High Sensitivity) 75 (H) <18 ng/L    Comment: (NOTE) Elevated high sensitivity troponin I (hsTnI) values and significant  changes across serial measurements may suggest ACS but many other  chronic and acute conditions are known to elevate hsTnI results.  Refer to the "Links" section for chest pain algorithms and additional  guidance. Performed at [facility], [address], [place], [place] [address]   I-stat chem 8, ED (not at [facility], [facility] or [facility])     Status: Abnormal   Collection Time: 04/05/23  6:39 PM  Result Value Ref Range   Sodium 137 135 - 145 mmol/L   Potassium 5.8 (H) 3.5 - 5.1 mmol/L   Chloride 105 98 - 111 mmol/L   BUN 54 (H) 8 - 23 mg/dL   Creatinine, Ser 6.04 (H) 0.44 - 1.00 mg/dL   Glucose, Bld 54 (L) 70 - 99 mg/dL    Comment: Glucose reference range applies only to samples taken after fasting for at least 8 hours.   Calcium, Ion 1.12 (L) 1.15 - 1.40 mmol/L   TCO2 14 (L) 22 - 32 mmol/L   Hemoglobin 16.0 (H) 12.0 - 15.0 g/dL   HCT 54.0 (H) 98.1 - 19.1 %  I-Stat venous blood gas, ([facility] ED, [facility], [facility])     Status: Abnormal   Collection Time: 04/05/23  6:39 PM  Result Value Ref Range   pH, Ven 7.064 (LL) 7.25 - 7.43   pCO2, Ven 39.5 (L) 44 - 60 mmHg   pO2, Ven 44 32 - 45 mmHg   Bicarbonate 11.3 (L) 20.0 - 28.0 mmol/L   TCO2 12 (L) 22 - 32 mmol/L   O2 Saturation 61 %   Acid-base deficit 18.0 (H) 0.0 - 2.0 mmol/L    Sodium 136 135 - 145 mmol/L   Potassium 5.8 (H) 3.5 - 5.1 mmol/L   Calcium, Ion 1.12 (L) 1.15 - 1.40 mmol/L   HCT 46.0 36.0 - 46.0 %   Hemoglobin 15.6 (H) 12.0 - 15.0 g/dL   Sample type VENOUS    Comment NOTIFIED PHYSICIAN   CBG monitoring, ED     Status: Abnormal   Collection Time: 04/05/23  8:48 PM  Result Value Ref Range   Glucose-Capillary 149 (H) 70 - 99 mg/dL    Comment: Glucose reference range applies only to samples taken after fasting for at least 8 hours.  Lactic acid, plasma     Status: Abnormal   Collection Time: 04/05/23  8:50 PM  Result Value Ref Range   Lactic Acid, Venous >9.0 (HH) 0.5 - 1.9 mmol/L    Comment: CRITICAL VALUE NOTED. VALUE IS CONSISTENT WITH PREVIOUSLY REPORTED/CALLED VALUE Performed at [facility], [address] [place], [place] [address]   Troponin I (High Sensitivity)     Status: Abnormal   Collection Time: 04/05/23  8:50 PM  Result Value Ref Range   Troponin I (High Sensitivity) 76 (H) <18 ng/L    Comment: (NOTE) Elevated high sensitivity troponin I (hsTnI) values and significant  changes across serial measurements may suggest ACS but many other  chronic and acute conditions are known to elevate hsTnI results.  Refer to the "Links" section for chest pain algorithms and additional  guidance. Performed at [facility] Lab, [address] [place], [place] [address]   I-Stat venous blood gas, [facility] ED, [facility], [facility])     Status: Abnormal   Collection Time: 04/05/23  9:25 PM  Result Value Ref Range   pH, Ven 7.047 (LL) 7.25 - 7.43   pCO2, Ven 33.7 (L) 44 - 60 mmHg   pO2, Ven 155 (H) 32 - 45 mmHg   Bicarbonate 9.3 (L) 20.0 - 28.0 mmol/L   TCO2 10 (L) 22 - 32 mmol/L   O2 Saturation 98 %   Acid-base deficit 20.0 (H) 0.0 - 2.0 mmol/L  Sodium 136 135 - 145 mmol/L   Potassium 5.1 3.5 - 5.1 mmol/L   Calcium, Ion 1.11 (L) 1.15 - 1.40 mmol/L   HCT 42.0 36.0 - 46.0 %   Hemoglobin 14.3 12.0 - 15.0 g/dL   Sample type VENOUS    Comment NOTIFIED  PHYSICIAN   CBG monitoring, ED     Status: Abnormal   Collection Time: 04/05/23 10:34 PM  Result Value Ref Range   Glucose-Capillary 159 (H) 70 - 99 mg/dL    Comment: Glucose reference range applies only to samples taken after fasting for at least 8 hours.  Lactic acid, plasma     Status: Abnormal   Collection Time: 04/05/23 10:55 PM  Result Value Ref Range   Lactic Acid, Venous >9.0 (HH) 0.5 - 1.9 mmol/L    Comment: CRITICAL VALUE NOTED. VALUE IS CONSISTENT WITH PREVIOUSLY REPORTED/CALLED VALUE Performed at [facility] Lab, [address] [place], [place] [address]   Cooxemetry Panel (carboxy, met, total hgb, O2 sat)     Status: None   Collection Time: 04/05/23 10:55 PM  Result Value Ref Range   Total hemoglobin 12.4 12.0 - 16.0 g/dL   O2 Saturation 60.4 %   Carboxyhemoglobin 1.3 0.5 - 1.5 %   Methemoglobin 1.1 0.0 - 1.5 %    Comment: Performed at [facility] Lab, [address] [place], [place] [address]  CBG monitoring, ED     Status: Abnormal   Collection Time: 04/06/23 12:09 AM  Result Value Ref Range   Glucose-Capillary 216 (H) 70 - 99 mg/dL    Comment: Glucose reference range applies only to samples taken after fasting for at least 8 hours.  CBG monitoring, ED     Status: Abnormal   Collection Time: 04/06/23 12:11 AM  Result Value Ref Range   Glucose-Capillary 207 (H) 70 - 99 mg/dL    Comment: Glucose reference range applies only to samples taken after fasting for at least 8 hours.  CBC     Status: Abnormal   Collection Time: 04/06/23 12:58 AM  Result Value Ref Range   WBC 12.2 (H) 4.0 - 10.5 K/uL   RBC 4.02 3.87 - 5.11 MIL/uL   Hemoglobin 11.9 (L) 12.0 - 15.0 g/dL   HCT 11.9 14.7 - 82.9 %   MCV 101.2 (H) 80.0 - 100.0 fL   MCH 29.6 26.0 - 34.0 pg   MCHC 29.2 (L) 30.0 - 36.0 g/dL   RDW 56.2 (H) 13.0 - 86.5 %   Platelets 215 150 - 400 K/uL   nRBC 0.2 0.0 - 0.2 %    Comment: Performed at [facility] Lab, [address], [place], [place] [address]  Basic  metabolic panel     Status: Abnormal   Collection Time: 04/06/23 12:58 AM  Result Value Ref Range   Sodium 136 135 - 145 mmol/L   Potassium 6.0 (H) 3.5 - 5.1 mmol/L   Chloride 97 (L) 98 - 111 mmol/L   CO2 11 (L) 22 - 32 mmol/L   Glucose, Bld 260 (H) 70 - 99 mg/dL    Comment: Glucose reference range applies only to samples taken after fasting for at least 8 hours.   BUN 62 (H) 8 - 23 mg/dL   Creatinine, Ser 6.29 (H) 0.44 - 1.00 mg/dL   Calcium 9.6 8.9 - 52.8 mg/dL   GFR, Estimated 10 (L) >60 mL/min    Comment: (NOTE) Calculated using the CKD-EPI Creatinine Equation (2021)    Anion gap  28 (H) 5 - 15    Comment: ELECTROLYTES REPEATED TO VERIFY Performed at [facility] Lab, [address], [place], [place] [address]   Magnesium     Status: None   Collection Time: 04/06/23 12:58 AM  Result Value Ref Range   Magnesium 2.4 1.7 - 2.4 mg/dL    Comment: Performed at [facility] Lab, [address] [place], [place] [address]  Phosphorus     Status: Abnormal   Collection Time: 04/06/23 12:58 AM  Result Value Ref Range   Phosphorus 7.4 (H) 2.5 - 4.6 mg/dL    Comment: Performed at [facility] Lab, [address] [place], [place] [address]  Procalcitonin     Status: None   Collection Time: 04/06/23 12:58 AM  Result Value Ref Range   Procalcitonin 0.81 ng/mL    Comment:        Interpretation: PCT > 0.5 ng/mL and <= 2 ng/mL: Systemic infection (sepsis) is possible, but other conditions are known to elevate PCT as well. (NOTE)       Sepsis PCT Algorithm           Lower Respiratory Tract                                      Infection PCT Algorithm    ----------------------------     ----------------------------         PCT < 0.25 ng/mL                PCT < 0.10 ng/mL          Strongly encourage             Strongly discourage   discontinuation of antibiotics    initiation of antibiotics    ----------------------------     -----------------------------       PCT 0.25 - 0.50  ng/mL            PCT 0.10 - 0.25 ng/mL               OR       >80% decrease in PCT            Discourage initiation of                                            antibiotics      Encourage discontinuation           of antibiotics    ----------------------------     -----------------------------         PCT >= 0.50 ng/mL              PCT 0.26 - 0.50 ng/mL                AND       <80% decrease in PCT             Encourage initiation of                                             antibiotics       Encourage continuation           of  antibiotics    ----------------------------     -----------------------------        PCT >= 0.50 ng/mL                  PCT > 0.50 ng/mL               AND         increase in PCT                  Strongly encourage                                      initiation of antibiotics    Strongly encourage escalation           of antibiotics                                     -----------------------------                                           PCT <= 0.25 ng/mL                                                 OR                                        > 80% decrease in PCT                                      Discontinue / Do not initiate                                             antibiotics  Performed at [facility] Lab, [address] [place], [place] [address]   Lactic acid, plasma     Status: Abnormal   Collection Time: 04/06/23 12:58 AM  Result Value Ref Range   Lactic Acid, Venous >9.0 (HH) 0.5 - 1.9 mmol/L    Comment: CRITICAL VALUE NOTED. VALUE IS CONSISTENT WITH PREVIOUSLY REPORTED/CALLED VALUE Performed at [facility], [address], [place], [place] [address]   Brain natriuretic peptide     Status: Abnormal   Collection Time: 04/06/23 12:58 AM  Result Value Ref Range   B Natriuretic Peptide 2,843.8 (H) 0.0 - 100.0 pg/mL    Comment: Performed at [facility] Lab, [address] [place], [place] [address]  CBG monitoring, ED      Status: Abnormal   Collection Time: 04/06/23  1:06 AM  Result Value Ref Range   Glucose-Capillary 240 (H) 70 - 99 mg/dL    Comment: Glucose reference range applies only to samples taken after fasting for at least 8 hours.  CBG monitoring, ED     Status: Abnormal   Collection Time:  04/06/23  5:01 AM  Result Value Ref Range   Glucose-Capillary 182 (H) 70 - 99 mg/dL    Comment: Glucose reference range applies only to samples taken after fasting for at least 8 hours.   Comment 1 Notify RN    Comment 2 Document in Chart   Basic metabolic panel     Status: Abnormal   Collection Time: 04/06/23  7:29 AM  Result Value Ref Range   Sodium 140 135 - 145 mmol/L   Potassium 4.8 3.5 - 5.1 mmol/L   Chloride 97 (L) 98 - 111 mmol/L   CO2 25 22 - 32 mmol/L   Glucose, Bld 148 (H) 70 - 99 mg/dL    Comment: Glucose reference range applies only to samples taken after fasting for at least 8 hours.   BUN 62 (H) 8 - 23 mg/dL   Creatinine, Ser 1.61 (H) 0.44 - 1.00 mg/dL   Calcium 9.5 8.9 - 09.6 mg/dL   GFR, Estimated 10 (L) >60 mL/min    Comment: (NOTE) Calculated using the CKD-EPI Creatinine Equation (2021)    Anion gap 18 (H) 5 - 15    Comment: Performed at [facility] Lab, [address], [place], [place] [address]  I-Stat venous blood gas, [facility] ED, [facility], [facility])     Status: Abnormal   Collection Time: 04/06/23  7:51 AM  Result Value Ref Range   pH, Ven 7.384 7.25 - 7.43   pCO2, Ven 45.2 44 - 60 mmHg   pO2, Ven 127 (H) 32 - 45 mmHg   Bicarbonate 27.0 20.0 - 28.0 mmol/L   TCO2 28 22 - 32 mmol/L   O2 Saturation 99 %   Acid-Base Excess 1.0 0.0 - 2.0 mmol/L   Sodium 139 135 - 145 mmol/L   Potassium 5.0 3.5 - 5.1 mmol/L   Calcium, Ion 1.12 (L) 1.15 - 1.40 mmol/L   HCT 38.0 36.0 - 46.0 %   Hemoglobin 12.9 12.0 - 15.0 g/dL   Sample type VENOUS   CBG monitoring, ED     Status: Abnormal   Collection Time: 04/06/23  8:16 AM  Result Value Ref Range   Glucose-Capillary 139 (H) 70 - 99 mg/dL    Comment:  Glucose reference range applies only to samples taken after fasting for at least 8 hours.   *Note: Due to a large number of results and/or encounters for the requested time period, some results have not been displayed. A complete set of results can be found in Results Review.   CT ABDOMEN PELVIS WO CONTRAST  Result Date: 04/06/2023 CLINICAL DATA:  Sepsis, known E coli gastroenteritis, lactic acidosis, concern for heart failure. EXAM: CT ABDOMEN AND PELVIS WITHOUT CONTRAST TECHNIQUE: Multidetector CT imaging of the abdomen and pelvis was performed following the standard protocol without IV contrast. RADIATION DOSE REDUCTION: This exam was performed according to the departmental dose-optimization program which includes automated exposure control, adjustment of the mA and/or kV according to patient size and/or use of iterative reconstruction technique. COMPARISON:  03/30/2023. FINDINGS: Lower chest: Heart is enlarged and pacemaker leads are present in the heart. Coronary artery calcifications are present. There is a small right pleural effusion with atelectasis at the lung bases. Hepatobiliary: A stable 1.4 cm hypodensity is noted in the left lobe of the liver, previously characterized of the cyst. No biliary ductal dilatation. Hyperdense material is present in the gallbladder, possible stones, sludge, or excreted contrast Pancreas: Unremarkable. No pancreatic ductal dilatation or surrounding inflammatory changes. Spleen: Normal in  size without focal abnormality. Adrenals/Urinary Tract: There is a stable 2.5 cm left adrenal nodule, previously described as probable myelolipoma and not well evaluated on this exam. The right adrenal gland is within normal limits. No renal calculus or hydronephrosis. The bladder is unremarkable. Stomach/Bowel: Stomach is within normal limits. Appendix appears normal. No bowel obstruction, free air or pneumatosis. Scattered diverticula are present along the colon with mild thickening  of the walls of the sigmoid colon. Vascular/Lymphatic: Aortic atherosclerosis. No enlarged abdominal or pelvic lymph nodes. Reproductive: Status post hysterectomy. No adnexal masses. Other: Mesenteric fat stranding is noted with a small amount of free fluid in the presacral space and pelvis. Anasarca is noted. A fat containing hernia is noted in the mid anterior abdomen superior to the umbilicus. Musculoskeletal: Degenerative changes are present in the thoracolumbar spine. No acute osseous abnormality. IMPRESSION: 1. Sigmoid diverticulosis with bowel wall thickening, possible mild diverticulitis. 2. Small right pleural effusion with atelectasis at the lung bases. 3. Anasarca. 4. Small amount of free fluid in the pelvis. 5. Aortic atherosclerosis and coronary artery calcifications. 6. Hyperdense material in the gallbladder, possible stones, sludge, or excreted contrast. 7. Remaining incidental findings as described above. Electronically Signed   By: Thornell Sartorius M.D.   On: 04/06/2023 00:56   DG Chest Port 1 View  Result Date: 04/05/2023 CLINICAL DATA:  Central line adjustment EXAM: PORTABLE CHEST 1 VIEW COMPARISON:  04/05/2023 FINDINGS: Right central line in place with the tip remaining in the right atrium, approximately 4 cm deep to the cavoatrial junction. Left AICD is unchanged. Cardiomegaly with vascular congestion improving bibasilar opacities. IMPRESSION: Right central line tip remains in the mid right atrium approximately 4 cm deep to the cavoatrial junction. Cardiomegaly, vascular congestion, improving bibasilar opacities. Electronically Signed   By: Charlett Nose M.D.   On: 04/05/2023 22:23   DG Chest Portable 1 View  Result Date: 04/05/2023 CLINICAL DATA:  Central line placement EXAM: PORTABLE CHEST 1 VIEW COMPARISON:  04/05/2023 FINDINGS: Right central line is been placed with the tip in the right atrium. Left AICD remains in place, unchanged. Cardiomegaly, vascular congestion. Bibasilar opacities  could reflect atelectasis or edema. No effusions or acute bony abnormality. IMPRESSION: Right central line tip in the right atrium.  No pneumothorax. Cardiomegaly, vascular congestion bibasilar atelectasis or edema. Electronically Signed   By: Charlett Nose M.D.   On: 04/05/2023 22:22   US Renal  Result Date: 04/05/2023 CLINICAL DATA:  Renal failure. EXAM: RENAL / URINARY TRACT ULTRASOUND COMPLETE COMPARISON:  None Available. FINDINGS: Right Kidney: Renal measurements: 10.2 cm x 4.2 cm x 3.6 cm = volume: 80.88 mL. Echogenicity within normal limits. No mass or hydronephrosis visualized. Left Kidney: Renal measurements: 9.7 cm x 5.6 cm x 5.0 cm = volume: 141.77 mL. Echogenicity within normal limits. No mass or hydronephrosis visualized. Bladder: Empty and subsequently limited in evaluation. Other: Of incidental note is the presence of a right pleural effusion. IMPRESSION: 1. Unremarkable ultrasonographic appearance of the kidneys. 2. Right pleural effusion. Electronically Signed   By: Aram Candela M.D.   On: 04/05/2023 20:12   DG Chest Port 1 View  Result Date: 04/05/2023 CLINICAL DATA:  Abdominal pain, nausea and vomiting, tachypnea EXAM: PORTABLE CHEST 1 VIEW COMPARISON:  01/24/2023 FINDINGS: Single frontal view of the chest demonstrates an enlarged cardiac silhouette. Stable dual lead pacer/AICD. Increasing central vascular congestion, with bilateral perihilar airspace disease. Trace bilateral pleural effusions. No pneumothorax. IMPRESSION: 1. Constellation of findings favoring congestive heart failure and mild  pulmonary edema. Superimposed infection cannot be excluded. Electronically Signed   By: Sharlet Salina M.D.   On: 04/05/2023 19:44    Pending Labs Unresulted Labs (From admission, onward)     Start     Ordered   04/07/23 0500  CBC  Tomorrow morning,   R        04/06/23 0840   04/07/23 0500  Basic metabolic panel  Tomorrow morning,   R        04/06/23 0840   04/06/23 0612  Blood gas,  venous  Once,   R        04/06/23 0611   04/05/23 2238  Sodium, urine, random  Once,   R        04/05/23 2237   04/05/23 2238  Creatinine, urine, random  Once,   R        04/05/23 2237   04/05/23 2238  Urea nitrogen, urine  Once,   R        04/05/23 2237   04/05/23 2238  Urinalysis, Routine w reflex microscopic -Urine, Clean Catch  Once,   R       Question:  Specimen Source  Answer:  Urine, Clean Catch   04/05/23 2237            Vitals/Pain Today's Vitals   04/06/23 1030 04/06/23 1045 04/06/23 1100 04/06/23 1102  BP: 120/64 107/86 127/66   Pulse: 70 70 70   Resp: 14 17 (!) 21   Temp:    97.9 F (36.6 C)  TempSrc:      SpO2: 100% 100% 100%   Weight:      Height:      PainSc:        Isolation Precautions No active isolations  Medications Medications  docusate sodium (COLACE) capsule 100 mg (has no administration in time range)  polyethylene glycol (MIRALAX / GLYCOLAX) packet 17 g (has no administration in time range)  ondansetron (ZOFRAN) injection 4 mg (has no administration in time range)  insulin aspart (novoLOG) injection 1-3 Units (1 Units Subcutaneous Given 04/06/23 0828)  amiodarone (PACERONE) tablet 200 mg (has no administration in time range)  ondansetron (ZOFRAN-ODT) disintegrating tablet 4 mg (has no administration in time range)  apixaban (ELIQUIS) tablet 2.5 mg (has no administration in time range)  clopidogrel (PLAVIX) tablet 75 mg (has no administration in time range)  azithromycin (ZITHROMAX) 500 mg in sodium chloride 0.9 % 250 mL IVPB (0 mg Intravenous Stopped 04/06/23 0025)  ceFEPIme (MAXIPIME) 1 g in sodium chloride 0.9 % 100 mL IVPB (has no administration in time range)  sodium bicarbonate 150 mEq in sterile water 1,150 mL infusion ( Intravenous Stopped 04/06/23 1102)  metoCLOPramide (REGLAN) injection 10 mg (10 mg Intravenous Given 04/05/23 1844)  fentaNYL (SUBLIMAZE) injection 50 mcg (50 mcg Intravenous Given 04/05/23 1844)  ondansetron (ZOFRAN)  injection 4 mg (4 mg Intravenous Given 04/05/23 1842)  calcium gluconate 1 g/ 50 mL sodium chloride IVPB (0 mg Intravenous Stopped 04/05/23 2031)  albuterol (PROVENTIL) (2.5 MG/3ML) 0.083% nebulizer solution 10 mg (10 mg Nebulization Given 04/05/23 1953)  insulin aspart (novoLOG) injection 5 Units (5 Units Intravenous Given 04/05/23 1937)    And  dextrose 50 % solution 50 mL (50 mLs Intravenous Given 04/05/23 1936)  sodium bicarbonate injection 50 mEq (50 mEq Intravenous Given 04/05/23 1940)  midazolam (VERSED) injection 1-2 mg (1 mg Intravenous Given 04/05/23 2150)  bisacodyl (DULCOLAX) suppository 10 mg (10 mg Rectal Given 04/06/23 0226)  calcium gluconate 1  g/ 50 mL sodium chloride IVPB (0 mg Intravenous Stopped 04/06/23 0025)  furosemide (LASIX) injection 80 mg (80 mg Intravenous Given 04/06/23 0532)  ceFEPIme (MAXIPIME) 2 g in sodium chloride 0.9 % 100 mL IVPB (0 g Intravenous Stopped 04/06/23 0334)  sodium zirconium cyclosilicate (LOKELMA) packet 10 g (10 g Oral Given 04/06/23 0657)  sodium bicarbonate injection 50 mEq (50 mEq Intravenous Given 04/06/23 1005)  sodium bicarbonate injection 50 mEq (50 mEq Intravenous Given 04/06/23 1005)    Mobility walks with device     Focused Assessments Pulmonary Assessment Handoff:  Lung sounds: Bilateral Breath Sounds: Diminished O2 Device: Bi-PAP      R Recommendations: See Admitting Provider Note  Report given to:   Additional Notes:  We spoke with MD about Bicarb.  Bicarb Drip stopped 2 amps were given pt still on bipap. Husband at bedside. He was going to leave for an hr to go home grab some things and take his meds

## 2023-04-07 ENCOUNTER — Ambulatory Visit: Payer: Medicare Other | Attending: Cardiology | Admitting: Cardiology

## 2023-04-07 ENCOUNTER — Inpatient Hospital Stay (HOSPITAL_COMMUNITY): Payer: Medicare Other

## 2023-04-07 DIAGNOSIS — N179 Acute kidney failure, unspecified: Secondary | ICD-10-CM | POA: Diagnosis not present

## 2023-04-07 DIAGNOSIS — E872 Acidosis, unspecified: Secondary | ICD-10-CM

## 2023-04-07 DIAGNOSIS — I5023 Acute on chronic systolic (congestive) heart failure: Secondary | ICD-10-CM | POA: Diagnosis not present

## 2023-04-07 LAB — CBC
HCT: 35.6 % — ABNORMAL LOW (ref 36.0–46.0)
HCT: 34 % — ABNORMAL LOW (ref 36.0–46.0)
Hemoglobin: 11.4 g/dL — ABNORMAL LOW (ref 12.0–15.0)
Hemoglobin: 10.9 g/dL — ABNORMAL LOW (ref 12.0–15.0)
MCH: 29.8 pg (ref 26.0–34.0)
MCH: 30 pg (ref 26.0–34.0)
MCHC: 32 g/dL (ref 30.0–36.0)
MCHC: 32.1 g/dL (ref 30.0–36.0)
MCV: 93 fL (ref 80.0–100.0)
MCV: 93.7 fL (ref 80.0–100.0)
Platelets: 175 10*3/uL (ref 150–400)
Platelets: 162 10*3/uL (ref 150–400)
RBC: 3.83 MIL/uL — ABNORMAL LOW (ref 3.87–5.11)
RBC: 3.63 MIL/uL — ABNORMAL LOW (ref 3.87–5.11)
RDW: 15.7 % — ABNORMAL HIGH (ref 11.5–15.5)
RDW: 15.7 % — ABNORMAL HIGH (ref 11.5–15.5)
WBC: 8.7 10*3/uL (ref 4.0–10.5)
WBC: 8.3 10*3/uL (ref 4.0–10.5)
nRBC: 0.2 % (ref 0.0–0.2)
nRBC: 0.4 % — ABNORMAL HIGH (ref 0.0–0.2)

## 2023-04-07 LAB — BASIC METABOLIC PANEL
Anion gap: 12 (ref 5–15)
BUN: 67 mg/dL — ABNORMAL HIGH (ref 8–23)
CO2: 29 mmol/L (ref 22–32)
Calcium: 8.8 mg/dL — ABNORMAL LOW (ref 8.9–10.3)
Chloride: 101 mmol/L (ref 98–111)
Creatinine, Ser: 3.95 mg/dL — ABNORMAL HIGH (ref 0.44–1.00)
GFR, Estimated: 11 mL/min — ABNORMAL LOW (ref >60)
Glucose, Bld: 155 mg/dL — ABNORMAL HIGH (ref 70–99)
Potassium: 4.4 mmol/L (ref 3.5–5.1)
Sodium: 142 mmol/L (ref 135–145)

## 2023-04-07 LAB — COMPREHENSIVE METABOLIC PANEL
ALT: 1119 U/L — ABNORMAL HIGH (ref 0–44)
AST: 823 U/L — ABNORMAL HIGH (ref 15–41)
Albumin: 2.7 g/dL — ABNORMAL LOW (ref 3.5–5.0)
Alkaline Phosphatase: 229 U/L — ABNORMAL HIGH (ref 38–126)
Anion gap: 12 (ref 5–15)
BUN: 66 mg/dL — ABNORMAL HIGH (ref 8–23)
CO2: 29 mmol/L (ref 22–32)
Calcium: 8.5 mg/dL — ABNORMAL LOW (ref 8.9–10.3)
Chloride: 98 mmol/L (ref 98–111)
Creatinine, Ser: 3.76 mg/dL — ABNORMAL HIGH (ref 0.44–1.00)
GFR, Estimated: 11 mL/min — ABNORMAL LOW (ref >60)
Glucose, Bld: 135 mg/dL — ABNORMAL HIGH (ref 70–99)
Potassium: 4.2 mmol/L (ref 3.5–5.1)
Sodium: 139 mmol/L (ref 135–145)
Total Bilirubin: 1.2 mg/dL (ref 0.3–1.2)
Total Protein: 5.6 g/dL — ABNORMAL LOW (ref 6.5–8.1)

## 2023-04-07 LAB — DIC (DISSEMINATED INTRAVASCULAR COAGULATION)PANEL
D-Dimer, Quant: 7.98 ug/mL-FEU — ABNORMAL HIGH (ref 0.00–0.50)
Fibrinogen: 121 mg/dL — ABNORMAL LOW (ref 210–475)
INR: 5.6 (ref 0.8–1.2)
Platelets: 98 10*3/uL — ABNORMAL LOW (ref 150–400)
Prothrombin Time: 50.2 seconds — ABNORMAL HIGH (ref 11.4–15.2)
Smear Review: NONE SEEN
aPTT: 44 seconds — ABNORMAL HIGH (ref 24–36)

## 2023-04-07 LAB — GLUCOSE, CAPILLARY
Glucose-Capillary: 100 mg/dL — ABNORMAL HIGH (ref 70–99)
Glucose-Capillary: 149 mg/dL — ABNORMAL HIGH (ref 70–99)
Glucose-Capillary: 153 mg/dL — ABNORMAL HIGH (ref 70–99)
Glucose-Capillary: 218 mg/dL — ABNORMAL HIGH (ref 70–99)
Glucose-Capillary: 75 mg/dL (ref 70–99)
Glucose-Capillary: 86 mg/dL (ref 70–99)

## 2023-04-07 LAB — COOXEMETRY PANEL
Carboxyhemoglobin: 0.6 % (ref 0.5–1.5)
Methemoglobin: <0.7 % (ref 0.0–1.5)
O2 Saturation: 56.8 %
Total hemoglobin: 11.8 g/dL — ABNORMAL LOW (ref 12.0–16.0)

## 2023-04-07 LAB — PHOSPHORUS: Phosphorus: 4.8 mg/dL — ABNORMAL HIGH (ref 2.5–4.6)

## 2023-04-07 LAB — HEPATITIS PANEL, ACUTE
HCV Ab: NONREACTIVE
Hep A IgM: NONREACTIVE
Hep B C IgM: NONREACTIVE
Hepatitis B Surface Ag: NONREACTIVE

## 2023-04-07 LAB — LACTIC ACID, PLASMA: Lactic Acid, Venous: 2.1 mmol/L (ref 0.5–1.9)

## 2023-04-07 LAB — UREA NITROGEN, URINE: Urea Nitrogen, Ur: 257 mg/dL

## 2023-04-07 LAB — MAGNESIUM: Magnesium: 2.3 mg/dL (ref 1.7–2.4)

## 2023-04-07 MED ORDER — WHITE PETROLATUM EX OINT
TOPICAL_OINTMENT | CUTANEOUS | Status: DC | PRN
  Administered 2023-04-12: 1 via TOPICAL

## 2023-04-07 MED ORDER — HYDROCERIN EX CREA
TOPICAL_CREAM | Freq: Two times a day (BID) | CUTANEOUS | Status: DC
  Administered 2023-04-07: 1 via TOPICAL
  Filled 2023-04-07: qty 113

## 2023-04-07 MED ORDER — INSULIN ASPART 100 UNIT/ML IJ SOLN
1.0000 [IU] | Freq: Three times a day (TID) | INTRAMUSCULAR | Status: DC
  Administered 2023-04-07: 2 [IU] via SUBCUTANEOUS
  Administered 2023-04-07 – 2023-04-08 (×2): 3 [IU] via SUBCUTANEOUS
  Administered 2023-04-09 (×2): 2 [IU] via SUBCUTANEOUS
  Administered 2023-04-09: 3 [IU] via SUBCUTANEOUS
  Administered 2023-04-10: 1 [IU] via SUBCUTANEOUS
  Administered 2023-04-10: 2 [IU] via SUBCUTANEOUS
  Administered 2023-04-10 – 2023-04-11 (×2): 1 [IU] via SUBCUTANEOUS
  Administered 2023-04-11: 3 [IU] via SUBCUTANEOUS

## 2023-04-07 NOTE — Progress Notes (Signed)
NAME:  Natasha Woodard, MRN:  [id number], DOB:  [birth year], LOS: 2 ADMISSION DATE:  04/05/2023, CONSULTATION DATE:  2/24 REFERRING MD:  [facility], CHIEF COMPLAINT:  metabolic acidosis   History of Present Illness:  Natasha Woodard is an 83 y/o woman with a history of NSTEMI and cardiogenic shock with a complicated and prolonged admission earlier this year,  HFrEF, OSA, HTN, HLD.   Presented 4/24 with recurrent abdominal symptoms with early satiety, distention, diffuse pain throughout Natasha abdomen, nausea, and vomiting. She had a recent admission and was discharged on 04/03/23 with ETEC gastroenteritis. She has a son who has been sick at home for a few days as well, but Natasha Woodard is unsure of what symptoms he has been having. She has been taking Natasha meds since discharge, but did not take most of them this morning due to nausea. She has noticed more swelling, but cannot give a timeline of that. While in ED patient with reported respiratory difficulty, no hypoxia noted, CXR with pulmonary edema. Started on BiPAP.   Labs revealing for Bicarb 14, Crt 4.4, BUN 54. Lactic Acid >9. Admitted to ICU. Nephrology and Cardiology Consulted. SVO2 70. Lactic Acidosis resolved.   Pertinent  Medical History  Recent ETEC gastroenteritis NSTEMI, cardiogenic shock- previous prolonged admission 1/30- 02/19/23 HFrEF, AICD in place OSA Pseudoaneurysm, s/p repair & debridements R groin  Significant Hospital Events: Including procedures, antibiotic start and stop dates in addition to other pertinent events   4/24 admitted, CVC placed  Interim History / Subjective:  On 2L [place]. Eating breakfast this AM, denies nausea.   Objective   Blood pressure 121/67, pulse 70, temperature 98.1 F (36.7 C), temperature source Oral, resp. rate (!) 22, height 5\' 2"  (1.575 m), weight 77.2 kg, SpO2 100 %. CVP:  [0 mmHg-33 mmHg] 30 mmHg      Intake/Output Summary (Last 24 hours) at 04/07/2023 1029 Last data filed at 04/07/2023  1000 Gross per 24 hour  Intake 2422.46 ml  Output 872 ml  Net 1550.46 ml   Filed Weights   04/05/23 1717 04/06/23 1200  Weight: 70.5 kg 77.2 kg    Examination: General: Elderly female, sitting in chair, no distress  Cardiac: Atrial paced, HR 72  Lungs: Coarse breath sounds, no use of accessory muscles  Abdomen: Obese, soft, active bowel sounds  Lower extremities with +2 edema Foley in place Neuro: Alert, oriented, follows commands, hard of hearing   Resolved Hospital Problem list     Assessment & Plan:   Acute on chronic HFrEF  - ECHO EF <20, LV severely decreased function, severe global hypokinesis, Grade 3 DD, severely elevated pulmonary artery systolic pressure RVSP 72.5, left artrial severely dilated, right mildly dilated  A.Fib  Plan - Cardiac Monitoring  - Cardiology following - con't eliquis - con't amiodarone - Per Cards holding lasix for now   Severe metabolic acidosis, lactic acidosis, presumed in setting of decompensated heart failure AKI on CKD IV, worry about cardiorenal syndrome vs HUS-type syndrome from ETEC (although she did not have the shiga-toxin producing variety) Hyperkalemia Hypocalcemia Plan - Nephrology Following  - lactic and metabolic acidosis improved  - Trend BMP - Replacing electrolytes as indicated   Elevated transaminases, hyperbilirubinemia- unclear if related to heart or from diarrhea - CT A/P with sigmoid diverticulosis with bowel wall thickening ?mild diverticulitis, no acute liver findings - RUQ [place] no evidence of acute cholecystitis  Plan  - Trend LFT - Obtain Acute Hep panel   Enterbacter, Proteus UTI  Plan - Continue Cefepime   ETEC diarrhea Abdominal distention, pain > improved, question secondary to hypoperfusion event Constipation  Plan - Supportive Care   Pre-DM Plan - monitor accuchecks, d50w PRN   Family updated at bedside. See separate ipal note. DNR; family not in agreement with patient.  Best Practice  (right click and "Reselect all SmartList Selections" daily)   Diet/type: Regular consistency (see orders) DVT prophylaxis: DOAC GI prophylaxis: N/A Lines: Central line Foley:  Yes, and it is still needed Code Status:  limited Last date of multidisciplinary goals of care discussion [4/24 in ED with patient and family]. Patient wishes for DNR status.   Dispo: Transfer to Progressive Care unit   Labs   CBC: Recent Labs  Lab 04/02/23 0154 04/03/23 0241 04/05/23 1820 04/05/23 1839 04/05/23 2125 04/06/23 0058 04/06/23 0751 04/07/23 0331  WBC 4.5 5.5 6.5  --   --  12.2*  --  8.7  NEUTROABS  --   --  5.4  --   --   --   --   --   HGB 11.7* 11.7* 13.7 15.6*  16.0* 14.3 11.9* 12.9 11.4*  HCT 36.0 38.5 47.1* 46.0  47.0* 42.0 40.7 38.0 35.6*  MCV 93.3 96.5 100.6*  --   --  101.2*  --  93.0  PLT 228 234 251  --   --  215  --  175    Basic Metabolic Panel: Recent Labs  Lab 04/02/23 0154 04/03/23 0241 04/05/23 1820 04/05/23 1839 04/05/23 2125 04/06/23 0058 04/06/23 0729 04/06/23 0751 04/06/23 1509 04/07/23 0331  NA 138   < > 139 136  137   < > 136 140 139 142 142  K 4.6   < > 6.0* 5.8*  5.8*   < > 6.0* 4.8 5.0 4.5 4.4  CL 102   < > 99 105  --  97* 97*  --  99 101  CO2 25   < > 11*  --   --  11* 25  --  29 29  GLUCOSE 135*   < > 62* 54*  --  260* 148*  --  92 155*  BUN 39*   < > 57* 54*  --  62* 62*  --  62* 67*  CREATININE 2.85*   < > 4.29* 4.40*  --  4.22* 4.10*  --  4.08* 3.95*  CALCIUM 9.2   < > 10.2  --   --  9.6 9.5  --  9.2 8.8*  MG 1.9  --   --   --   --  2.4  --   --   --  2.3  PHOS  --   --   --   --   --  7.4*  --   --   --  4.8*   < > = values in this interval not displayed.   GFR: Estimated Creatinine Clearance: 10.6 mL/min (A) (by C-G formula based on SCr of 3.95 mg/dL (H)). Recent Labs  Lab 04/03/23 0241 04/05/23 1820 04/05/23 2050 04/05/23 2255 04/06/23 0058 04/06/23 1355 04/07/23 0331  PROCALCITON  --   --   --   --  0.81  --   --   WBC 5.5  6.5  --   --  12.2*  --  8.7  LATICACIDVEN  --  >9.0*   < > >9.0* >9.0* 1.9 2.1*   < > = values in this interval not displayed.    Liver Function Tests: Recent Labs  Lab 04/01/23 0359 04/05/23 1820 04/06/23 1509  AST 41 804* 2,195*  ALT 61* 723* 1,643*  ALKPHOS 204* 262* 212*  BILITOT 0.7 2.7* 1.8*  PROT 6.3* 7.2 6.2*  ALBUMIN 3.2* 3.7 3.4*   Recent Labs  Lab 04/05/23 1820  LIPASE 28   No results for input(s): "AMMONIA" in the last 168 hours.  ABG    Component Value Date/Time   PHART 7.361 01/10/2023 1942   PCO2ART 43.9 01/10/2023 1942   PO2ART 87 01/10/2023 1942   HCO3 34.5 (H) 04/06/2023 1232   TCO2 28 04/06/2023 0751   ACIDBASEDEF 20.0 (H) 04/05/2023 2125   O2SAT 56.8 04/07/2023 0924     Coagulation Profile: No results for input(s): "INR", "PROTIME" in the last 168 hours.  Cardiac Enzymes: No results for input(s): "CKTOTAL", "CKMB", "CKMBINDEX", "TROPONINI" in the last 168 hours.  HbA1C: Hgb A1c MFr Bld  Date/Time Value Ref Range Status  03/31/2023 09:30 AM 5.6 4.8 - 5.6 % Final    Comment:    (NOTE) Pre diabetes:          5.7%-6.4%  Diabetes:              >6.4%  Glycemic control for   <7.0% adults with diabetes   03/13/2023 03:18 PM 5.2 4.6 - 6.5 % Final    Comment:    Glycemic Control Guidelines for People with Diabetes:Non Diabetic:  <6%Goal of Therapy: <7%Additional Action Suggested:  >8%     CBG: Recent Labs  Lab 04/06/23 1728 04/06/23 2024 04/07/23 0008 04/07/23 0326 04/07/23 0807  GLUCAP 95 157* 75 149* 100*    Time Spent: 55 minutes  Jovita Kussmaul, AGACNP-BC [facility]  PCCM Pgr: [phone]

## 2023-04-07 NOTE — Progress Notes (Signed)
eLink Physician-Brief Progress Note Patient Name: Natasha Woodard DOB: [birth year] MRN: [id number]   Date of Service  04/07/2023  HPI/Events of Note  0500 hematuria started.  About 100 ml output since started. Hgb stable.    eICU Interventions  Need to monitor for now.  If continues may need to consider hold eliquis and irrigate bladder.     Intervention Category Intermediate Interventions: Other:  Henry Russel, Demetrius Charity 04/07/2023, 6:26 AM

## 2023-04-07 NOTE — Progress Notes (Signed)
Progress Note  Patient Name: Natasha Woodard Date of Encounter: 04/07/2023  Primary Cardiologist: Bryan Lemma, MD   Subjective   Patient seen and examined at her bedside.  She was sitting up in the chair her husband visiting in the room.  She had no complaints. Overnight events with, Foley catheter still does have some tinge of red blood.  Inpatient Medications    Scheduled Meds:  amiodarone  200 mg Oral Daily   apixaban  2.5 mg Oral BID   Chlorhexidine Gluconate Cloth  6 each Topical Daily   clopidogrel  75 mg Oral Daily   insulin aspart  1-3 Units Subcutaneous Q4H   Continuous Infusions:  azithromycin Stopped (04/06/23 2209)   ceFEPime (MAXIPIME) IV Stopped (04/06/23 2346)   PRN Meds: docusate sodium, ondansetron (ZOFRAN) IV, ondansetron, mouth rinse, polyethylene glycol, traMADol   Vital Signs    Vitals:   04/07/23 0200 04/07/23 0300 04/07/23 0400 04/07/23 0500  BP: 124/77 121/67 (!) 118/59 120/69  Pulse:  70 70 70  Resp: 20 (!) 22 19 18   Temp:      TempSrc:      SpO2:  100% 100% 99%  Weight:      Height:        Intake/Output Summary (Last 24 hours) at 04/07/2023 0750 Last data filed at 04/07/2023 0551 Gross per 24 hour  Intake 1946.46 ml  Output 802 ml  Net 1144.46 ml   Filed Weights   04/05/23 1717 04/06/23 1200  Weight: 70.5 kg 77.2 kg    Telemetry    Atrial paced rhythm- Personally Reviewed  ECG    None- Personally Reviewed  Physical Exam    General: Comfortable, with Foley in place Head: Atraumatic, normal size  Eyes: PEERLA, EOMI  Neck: Supple, normal JVD Cardiac: Normal S1, S2; RRR; no murmurs, rubs, or gallops Lungs: Clear to auscultation bilaterally Abd: Soft, nontender, no hepatomegaly  Ext: warm, no edema Musculoskeletal: No deformities, BUE and BLE strength normal and equal Skin: Warm and dry, no rashes   Neuro: Alert and oriented to person, place, time, and situation, CNII-XII grossly intact, no focal deficits  Psych:  Normal mood and affect   Labs    Chemistry Recent Labs  Lab 04/01/23 0359 04/02/23 0154 04/05/23 1820 04/05/23 1839 04/06/23 0729 04/06/23 0751 04/06/23 1509 04/07/23 0331  NA 134*   < > 139   < > 140 139 142 142  K 4.2   < > 6.0*   < > 4.8 5.0 4.5 4.4  CL 101   < > 99   < > 97*  --  99 101  CO2 22   < > 11*   < > 25  --  29 29  GLUCOSE 111*   < > 62*   < > 148*  --  92 155*  BUN 41*   < > 57*   < > 62*  --  62* 67*  CREATININE 2.76*   < > 4.29*   < > 4.10*  --  4.08* 3.95*  CALCIUM 8.9   < > 10.2   < > 9.5  --  9.2 8.8*  PROT 6.3*  --  7.2  --   --   --  6.2*  --   ALBUMIN 3.2*  --  3.7  --   --   --  3.4*  --   AST 41  --  804*  --   --   --  2,195*  --  ALT 61*  --  723*  --   --   --  1,643*  --   ALKPHOS 204*  --  262*  --   --   --  212*  --   BILITOT 0.7  --  2.7*  --   --   --  1.8*  --   GFRNONAA 17*   < > 10*   < > 10*  --  10* 11*  ANIONGAP 11   < > 29*   < > 18*  --  14 12   < > = values in this interval not displayed.     Hematology Recent Labs  Lab 04/05/23 1820 04/05/23 1839 04/06/23 0058 04/06/23 0751 04/07/23 0331  WBC 6.5  --  12.2*  --  8.7  RBC 4.68  --  4.02  --  3.83*  HGB 13.7   < > 11.9* 12.9 11.4*  HCT 47.1*   < > 40.7 38.0 35.6*  MCV 100.6*  --  101.2*  --  93.0  MCH 29.3  --  29.6  --  29.8  MCHC 29.1*  --  29.2*  --  32.0  RDW 15.8*  --  15.8*  --  15.7*  PLT 251  --  215  --  175   < > = values in this interval not displayed.    Cardiac EnzymesNo results for input(s): "TROPONINI" in the last 168 hours. No results for input(s): "TROPIPOC" in the last 168 hours.   BNP Recent Labs  Lab 04/05/23 1820 04/06/23 0058  BNP 2,550.1* 2,843.8*     DDimer No results for input(s): "DDIMER" in the last 168 hours.   Radiology    DG CHEST PORT 1 VIEW  Result Date: 04/07/2023 CLINICAL DATA:  Abnormal respiration. EXAM: PORTABLE CHEST 1 VIEW COMPARISON:  04/05/2023 FINDINGS: The cardio pericardial silhouette is enlarged. There is  pulmonary vascular congestion without overt pulmonary edema. Left pacer/AICD again noted. Right IJ central line tip overlies the SVC/RA junction. IMPRESSION: Enlargement of the cardiopericardial silhouette with pulmonary vascular congestion. Electronically Signed   By: Kennith Center M.D.   On: 04/07/2023 06:10   US Abdomen Limited RUQ (LIVER/GB)  Result Date: 04/06/2023 CLINICAL DATA:  Acute cholecystitis. EXAM: ULTRASOUND ABDOMEN LIMITED RIGHT UPPER QUADRANT COMPARISON:  None Available. FINDINGS: Gallbladder: No gallstones or wall thickening visualized. No sonographic Murphy sign noted by sonographer. Common bile duct: Diameter: 3.5 mm Liver: Within normal limits in parenchymal echogenicity. 0.3 x 1.3 x 1.8 cm hypoechoic mass in the subcapsular left lobe of the liver with appearance of a cyst. No internal blood flow. Portal vein is patent on color Doppler imaging with normal direction of blood flow towards the liver. Other: Right pleural effusion. IMPRESSION: Right pleural effusion. No evidence of acute cholecystitis or cholelithiasis. 1.8 cm benign-appearing subcapsular cyst in the left lobe of the liver. Electronically Signed   By: Ted Mcalpine M.D.   On: 04/06/2023 15:55   ECHOCARDIOGRAM COMPLETE  Result Date: 04/06/2023    ECHOCARDIOGRAM REPORT   Patient Name:   Natasha Woodard Date of Exam: 04/06/2023 Medical Rec #:  [id number]         Height:       62.0 in Accession #:    [id number]        Weight:       155.4 lb Date of Birth:  04-Oct-1941         BSA:  1.717 m Patient Age:    81 years          BP:           118/76 mmHg Patient Gender: F                 HR:           71 bpm. Exam Location:  Inpatient Procedure: 2D Echo, Cardiac Doppler and Color Doppler Indications:    CHF  History:        Patient has prior history of Echocardiogram examinations, most                 recent 01/18/2023. CHF, Previous Myocardial Infarction, Mitral                 Valve Disease; Risk Factors:Diabetes,  Dyslipidemia and                 Hypertension.  Sonographer:    Milbert Coulter Referring Phys: [id number] LAURA P CLARK IMPRESSIONS  1. Left ventricular ejection fraction, by estimation, is <20%. The left ventricle has severely decreased function. The left ventricle demonstrate severe global hypokinesis and regional wall motion abnormalities (see scoring diagram/findings for description). The left ventricular internal cavity size was mildly dilated. Left ventricular diastolic parameters are consistent with Grade III diastolic dysfunction (restrictive). Elevated left ventricular end-diastolic pressure. There is akinesis of the left ventricular, mid-apical lateral wall and inferolateral wall. There is akinesis of the left ventricular, apical segment.  2. Right ventricular systolic function is moderately reduced. The right ventricular size is normal. There is severely elevated pulmonary artery systolic pressure. The estimated right ventricular systolic pressure is 72.5 mmHg.  3. Left atrial size was severely dilated.  4. Right atrial size was mildly dilated.  5. Based on eccentricity of MR and akinesis of the inferolateral and lateral walls, suspect this is ischemic MR due to restricted posterior mitral valve leaflet.. The mitral valve is abnormal. Moderate to severe mitral valve regurgitation. No evidence of mitral stenosis.  6. The aortic valve is tricuspid. Aortic valve regurgitation is trivial. Aortic valve sclerosis/calcification is present, without any evidence of aortic stenosis. Aortic valve area, by VTI measures 1.81 cm. Aortic valve mean gradient measures 3.0 mmHg.  Aortic valve Vmax measures 1.15 m/s.  7. The inferior vena cava is dilated in size with <50% respiratory variability, suggesting right atrial pressure of 15 mmHg. FINDINGS  Left Ventricle: Left ventricular ejection fraction, by estimation, is <20%. The left ventricle has severely decreased function. The left ventricle demonstrates regional wall motion  abnormalities. The left ventricular internal cavity size was mildly dilated. There is no left ventricular hypertrophy. Left ventricular diastolic parameters are consistent with Grade III diastolic dysfunction (restrictive). Elevated left ventricular end-diastolic pressure. Right Ventricle: The right ventricular size is normal. No increase in right ventricular wall thickness. Right ventricular systolic function is moderately reduced. There is severely elevated pulmonary artery systolic pressure. The tricuspid regurgitant velocity is 3.79 m/s, and with an assumed right atrial pressure of 15 mmHg, the estimated right ventricular systolic pressure is 72.5 mmHg. Left Atrium: Left atrial size was severely dilated. Right Atrium: Right atrial size was mildly dilated. Pericardium: There is no evidence of pericardial effusion. Mitral Valve: Based on eccentricity of MR and akinesis of the inferolateral and lateral walls, suspect this is ischemic MR due to restricted posterior mitral valve leaflet. The mitral valve is abnormal. Moderate to severe mitral valve regurgitation, with  eccentric posteriorly directed jet. No evidence of  mitral valve stenosis. Tricuspid Valve: The tricuspid valve is normal in structure. Tricuspid valve regurgitation is mild . No evidence of tricuspid stenosis. Aortic Valve: The aortic valve is tricuspid. Aortic valve regurgitation is trivial. Aortic valve sclerosis/calcification is present, without any evidence of aortic stenosis. Aortic valve mean gradient measures 3.0 mmHg. Aortic valve peak gradient measures 5.3 mmHg. Aortic valve area, by VTI measures 1.81 cm. Pulmonic Valve: The pulmonic valve was normal in structure. Pulmonic valve regurgitation is not visualized. No evidence of pulmonic stenosis. Aorta: The aortic root is normal in size and structure. Venous: The inferior vena cava is dilated in size with less than 50% respiratory variability, suggesting right atrial pressure of 15 mmHg.  IAS/Shunts: No atrial level shunt detected by color flow Doppler. Additional Comments: A device lead is visualized.  LEFT VENTRICLE PLAX 2D LVIDd:         5.60 cm      Diastology LVIDs:         5.30 cm      LV e' medial:    4.24 cm/s LV PW:         0.90 cm      LV E/e' medial:  34.0 LV IVS:        1.00 cm      LV e' lateral:   8.49 cm/s LVOT diam:     2.00 cm      LV E/e' lateral: 17.0 LV SV:         38 LV SV Index:   22 LVOT Area:     3.14 cm  LV Volumes (MOD) LV vol d, MOD A2C: 141.0 ml LV vol d, MOD A4C: 115.0 ml LV vol s, MOD A2C: 97.1 ml LV vol s, MOD A4C: 110.0 ml LV SV MOD A2C:     43.9 ml LV SV MOD A4C:     115.0 ml LV SV MOD BP:      22.3 ml RIGHT VENTRICLE RV Basal diam:  3.90 cm RV Mid diam:    3.10 cm RV S prime:     9.36 cm/s TAPSE (M-mode): 1.9 cm LEFT ATRIUM              Index        RIGHT ATRIUM           Index LA diam:        5.30 cm  3.09 cm/m   RA Area:     19.60 cm LA Vol (A2C):   118.0 ml 68.71 ml/m  RA Volume:   50.80 ml  29.58 ml/m LA Vol (A4C):   109.0 ml 63.47 ml/m LA Biplane Vol: 114.0 ml 66.38 ml/m  AORTIC VALVE AV Area (Vmax):    2.33 cm AV Area (Vmean):   2.09 cm AV Area (VTI):     1.81 cm AV Vmax:           115.00 cm/s AV Vmean:          75.600 cm/s AV VTI:            0.212 m AV Peak Grad:      5.3 mmHg AV Mean Grad:      3.0 mmHg LVOT Vmax:         85.40 cm/s LVOT Vmean:        50.300 cm/s LVOT VTI:          0.122 m LVOT/AV VTI ratio: 0.58 MITRAL VALVE  TRICUSPID VALVE MV Area (PHT): 5.02 cm       TR Peak grad:   57.5 mmHg MV Decel Time: 151 msec       TR Vmax:        379.00 cm/s MR Peak grad:    131.3 mmHg MR Mean grad:    86.0 mmHg    SHUNTS MR Vmax:         573.00 cm/s  Systemic VTI:  0.12 m MR Vmean:        444.0 cm/s   Systemic Diam: 2.00 cm MR PISA:         4.02 cm MR PISA Eff ROA: 16 mm MR PISA Radius:  0.80 cm MV E velocity: 144.00 cm/s MV A velocity: 50.80 cm/s MV E/A ratio:  2.83 Armanda Magic MD Electronically signed by Armanda Magic MD Signature  Date/Time: 04/06/2023/3:50:45 PM    Final    CT ABDOMEN PELVIS WO CONTRAST  Result Date: 04/06/2023 CLINICAL DATA:  Sepsis, known E coli gastroenteritis, lactic acidosis, concern for heart failure. EXAM: CT ABDOMEN AND PELVIS WITHOUT CONTRAST TECHNIQUE: Multidetector CT imaging of the abdomen and pelvis was performed following the standard protocol without IV contrast. RADIATION DOSE REDUCTION: This exam was performed according to the departmental dose-optimization program which includes automated exposure control, adjustment of the mA and/or kV according to patient size and/or use of iterative reconstruction technique. COMPARISON:  03/30/2023. FINDINGS: Lower chest: Heart is enlarged and pacemaker leads are present in the heart. Coronary artery calcifications are present. There is a small right pleural effusion with atelectasis at the lung bases. Hepatobiliary: A stable 1.4 cm hypodensity is noted in the left lobe of the liver, previously characterized of the cyst. No biliary ductal dilatation. Hyperdense material is present in the gallbladder, possible stones, sludge, or excreted contrast Pancreas: Unremarkable. No pancreatic ductal dilatation or surrounding inflammatory changes. Spleen: Normal in size without focal abnormality. Adrenals/Urinary Tract: There is a stable 2.5 cm left adrenal nodule, previously described as probable myelolipoma and not well evaluated on this exam. The right adrenal gland is within normal limits. No renal calculus or hydronephrosis. The bladder is unremarkable. Stomach/Bowel: Stomach is within normal limits. Appendix appears normal. No bowel obstruction, free air or pneumatosis. Scattered diverticula are present along the colon with mild thickening of the walls of the sigmoid colon. Vascular/Lymphatic: Aortic atherosclerosis. No enlarged abdominal or pelvic lymph nodes. Reproductive: Status post hysterectomy. No adnexal masses. Other: Mesenteric fat stranding is noted with a small  amount of free fluid in the presacral space and pelvis. Anasarca is noted. A fat containing hernia is noted in the mid anterior abdomen superior to the umbilicus. Musculoskeletal: Degenerative changes are present in the thoracolumbar spine. No acute osseous abnormality. IMPRESSION: 1. Sigmoid diverticulosis with bowel wall thickening, possible mild diverticulitis. 2. Small right pleural effusion with atelectasis at the lung bases. 3. Anasarca. 4. Small amount of free fluid in the pelvis. 5. Aortic atherosclerosis and coronary artery calcifications. 6. Hyperdense material in the gallbladder, possible stones, sludge, or excreted contrast. 7. Remaining incidental findings as described above. Electronically Signed   By: Thornell Sartorius M.D.   On: 04/06/2023 00:56   DG Chest Port 1 View  Result Date: 04/05/2023 CLINICAL DATA:  Central line adjustment EXAM: PORTABLE CHEST 1 VIEW COMPARISON:  04/05/2023 FINDINGS: Right central line in place with the tip remaining in the right atrium, approximately 4 cm deep to the cavoatrial junction. Left AICD is unchanged. Cardiomegaly with vascular congestion improving bibasilar opacities.  IMPRESSION: Right central line tip remains in the mid right atrium approximately 4 cm deep to the cavoatrial junction. Cardiomegaly, vascular congestion, improving bibasilar opacities. Electronically Signed   By: Charlett Nose M.D.   On: 04/05/2023 22:23   DG Chest Portable 1 View  Result Date: 04/05/2023 CLINICAL DATA:  Central line placement EXAM: PORTABLE CHEST 1 VIEW COMPARISON:  04/05/2023 FINDINGS: Right central line is been placed with the tip in the right atrium. Left AICD remains in place, unchanged. Cardiomegaly, vascular congestion. Bibasilar opacities could reflect atelectasis or edema. No effusions or acute bony abnormality. IMPRESSION: Right central line tip in the right atrium.  No pneumothorax. Cardiomegaly, vascular congestion bibasilar atelectasis or edema. Electronically Signed    By: Charlett Nose M.D.   On: 04/05/2023 22:22   US Renal  Result Date: 04/05/2023 CLINICAL DATA:  Renal failure. EXAM: RENAL / URINARY TRACT ULTRASOUND COMPLETE COMPARISON:  None Available. FINDINGS: Right Kidney: Renal measurements: 10.2 cm x 4.2 cm x 3.6 cm = volume: 80.88 mL. Echogenicity within normal limits. No mass or hydronephrosis visualized. Left Kidney: Renal measurements: 9.7 cm x 5.6 cm x 5.0 cm = volume: 141.77 mL. Echogenicity within normal limits. No mass or hydronephrosis visualized. Bladder: Empty and subsequently limited in evaluation. Other: Of incidental note is the presence of a right pleural effusion. IMPRESSION: 1. Unremarkable ultrasonographic appearance of the kidneys. 2. Right pleural effusion. Electronically Signed   By: Aram Candela M.D.   On: 04/05/2023 20:12   DG Chest Port 1 View  Result Date: 04/05/2023 CLINICAL DATA:  Abdominal pain, nausea and vomiting, tachypnea EXAM: PORTABLE CHEST 1 VIEW COMPARISON:  01/24/2023 FINDINGS: Single frontal view of the chest demonstrates an enlarged cardiac silhouette. Stable dual lead pacer/AICD. Increasing central vascular congestion, with bilateral perihilar airspace disease. Trace bilateral pleural effusions. No pneumothorax. IMPRESSION: 1. Constellation of findings favoring congestive heart failure and mild pulmonary edema. Superimposed infection cannot be excluded. Electronically Signed   By: Sharlet Salina M.D.   On: 04/05/2023 19:44    Cardiac Studies   Echo 04/06/2023 1. Left ventricular ejection fraction, by estimation, is <20%. The left  ventricle has severely decreased function. The left ventricle demonstrate  severe global hypokinesis and regional wall motion abnormalities (see  scoring diagram/findings for  description). The left ventricular internal cavity size was mildly  dilated. Left ventricular diastolic parameters are consistent with Grade  III diastolic dysfunction (restrictive). Elevated left ventricular   end-diastolic pressure. There is akinesis of  the left ventricular, mid-apical lateral wall and inferolateral wall.  There is akinesis of the left ventricular, apical segment.   2. Right ventricular systolic function is moderately reduced. The right  ventricular size is normal. There is severely elevated pulmonary artery  systolic pressure. The estimated right ventricular systolic pressure is  72.5 mmHg.   3. Left atrial size was severely dilated.   4. Right atrial size was mildly dilated.   5. Based on eccentricity of MR and akinesis of the inferolateral and  lateral walls, suspect this is ischemic MR due to restricted posterior  mitral valve leaflet.. The mitral valve is abnormal. Moderate to severe  mitral valve regurgitation. No evidence  of mitral stenosis.   6. The aortic valve is tricuspid. Aortic valve regurgitation is trivial.  Aortic valve sclerosis/calcification is present, without any evidence of  aortic stenosis. Aortic valve area, by VTI measures 1.81 cm. Aortic valve  mean gradient measures 3.0 mmHg.   Aortic valve Vmax measures 1.15 m/s.   7.  The inferior vena cava is dilated in size with <50% respiratory  variability, suggesting right atrial pressure of 15 mmHg.   Patient Profile     82 y.o. female  hx of NICM secondary to frequent PVCs, CAD, HTN, HLD, DM II, PAD, CKD stage III, OSA, PAF and h/o ICD.  Assessment & Plan    AKI on Severe on CKD  Acute hypoxic respiratory failure  CAD s/p recent NSTEMI PAF  Electrolyte Abnormalities  Creatinine trending down slowly, 3.95 today, from 4.08 few days ago was 4.2 Noted yesterday suspect worsening kidney function was multifactorial likely due to pre-renal azotemia from poor perfusion in the setting of her severely depressed ejection fraction, Progressing to ATN. For now agree with holding the lasix and monitor closely.   CVP this morning 16, No IVF for now. Will also fluid restrict oral ( 1200 ml).    Vitals stable,   coox in the 70s, monitor closely no indication to pursue clinical treatment for cardiogenic shock.    CAD - no anginal    LFT elevated but improving,  Per primary team.  CT of abdomen and pelvis without contrast showed sigmoid diverticulosis with bowel thickening, possibly mild diverticulitis, anasarca, small amount of free fluid in the pelvis.   Atrial paced, no afib, continue amiodarone. Continue Eliquis.    Will continue to follow with you.     For questions or updates, please contact [facility] Please consult [email or web address] for contact info under Cardiology/STEMI.      Signed, Thomasene Ripple, DO  04/07/2023, 7:50 AM

## 2023-04-07 NOTE — Progress Notes (Signed)
[facility] Progress Note  Name: Natasha Woodard MRN: [id number] DOB: 1941-06-07  Chief Complaint:  Nausea/vomiting  Subjective:  She remains in the ICU.  She had 802 mL uop over 4/25.  She has had hematuria overnight per charting.  This has appeared to clear a little with flushing the foley and they are going to flush again.  She is on blood thinners.    Review of systems:   She denies shortness of breath or chest pain.  She denies any shortness of breath with she was moving to the chair  Denies n/v or abdominal pain Had a BM this AM on the way to the commode and has just gotten cleaned up Sitting in the chair now  ---------------- Background on consult:  Natasha Woodard is an 82 y.o. female with DM, CAD h/o MI 01/2023 (complicated course), HFpEF, PAD, OSA, A fib, CKD3 currently admitted with N/V, severe acidosis and AKI for which nephrology is consulted.  Prolonged hospitalization 01/11/23 - 02/19/23 MI, cardiac arrest, cardiogenic shock req IABP, s/p ICD discharged to CIR where she stayed 3/10-3/19/24.  Readmitted 4/19-22 with N/V/abd pain with unrevealing CT.  Found to have enterotoxigenic E. coli ; AKI Cr ~3 from recent baseline 1.3-1.6mg /dL with neg renal US.  Improved with fluids, withholding ARB and diuretics to 2.4 at discharge.  Returned to ED today with N/V - triage VS 122/64, O2 96% ra, afebrile.  Labs showing Na 139, K 6, Bicarb 11, BUN 57, Cr 4.3, AG 29, AST 804, ALT 723 (from normal 7/20), T bili 2.7 (normal 4/20), BNP 2550; VBG 7.06, 38, lactate 9.   CXR mild pulm edema vs infection (favor former).  Renal US normal.  Due to increased WOB placed on bipap.  Potassium managed medically with shifting and given 1 amp bicarb + bicarb gtt.  PCCM consulted for admission.  Pt currently awake and alert on bipap with family bedside.  N/V/abd pain with po intake but not with palpation x days -- dehydrated.  Was taking meds until today - inc torsemide 20 daily but doesn't appear  to have been on MRB or ARB.  Decreased urine volume, normal character.   +LE edema recently.  No NSAID, no contrast.  Pt getting central line and going to ICU.     Intake/Output Summary (Last 24 hours) at 04/07/2023 0634 Last data filed at 04/07/2023 0551 Gross per 24 hour  Intake 1946.46 ml  Output 802 ml  Net 1144.46 ml    Vitals:  Vitals:   04/07/23 0200 04/07/23 0300 04/07/23 0400 04/07/23 0500  BP: 124/77 121/67 (!) 118/59 120/69  Pulse:  70 70 70  Resp: 20 (!) 22 19 18   Temp:      TempSrc:      SpO2:  100% 100% 99%  Weight:      Height:         Physical Exam:   General elderly female in chair in no acute distress HEENT normocephalic atraumatic extraocular movements intact sclera anicteric Neck supple trachea midline Lungs clear to auscultation bilaterally normal work of breathing at rest on 3 liters oxygen  Heart S1S2 no rub Abdomen soft nontender nondistended Extremities no pitting edema lower extremities Psych normal mood and affect Skin dry skin diffusely on her back  Neuro alert and oriented x 3 provides hx and follows commands; hard of hearing  GU foley in place with hematuria  Medications reviewed   Labs:     Latest Ref Rng & Units 04/07/2023  3:31 AM 04/06/2023    3:09 PM 04/06/2023    7:51 AM  BMP  Glucose 70 - 99 mg/dL 161  92    BUN 8 - 23 mg/dL 67  62    Creatinine 0.96 - 1.00 mg/dL 0.45  4.09    Sodium 811 - 145 mmol/L 142  142  139   Potassium 3.5 - 5.1 mmol/L 4.4  4.5  5.0   Chloride 98 - 111 mmol/L 101  99    CO2 22 - 32 mmol/L 29  29    Calcium 8.9 - 10.3 mg/dL 8.8  9.2       Assessment/Plan:    **AKI, severe on CKD 3:  - Secondary to pre-renal insults - n/v as well as ongoing diuretic.  Some ischemic insults as well and may have had some progression to ATN.  Note prior AKI event.  Consider atypical HUS however recent E coli was non shiga toxin producing; doubt this as etiology.  Renal US normal echogenicity and no hydro.  BL Cr  1.5-1.8 - She is improving with supportive care.    **severe anion gap metabolic acidosis:   - s/p amps of bicarb and s/p bicarb gtt.    # Acute hypoxic respiratory failure - she is off of bicarb gtt given concern for fluid overload  - s/p lasix IV x 2 doses on 4/25  - comfortable on exam today - defer redose for now but can redose lasix if needed for respiratory distress    # Possible Diverticulitis  - Scan with diverticulosis and possible mild diverticulitis  - per primary team    **Hyperkalemia:   - improving with medical measures  - transitioned from heart healthy to renal diet    ** CKD stage 3  - note baseline Cr 1.5 -1.8  **N/V/abd pain:  recent ETEC  - per primary team.  CT scan as above    **Transaminitis:  trend per primary - suspected to be congestive and may have ischemic insult contributing as well    **CAD s/p recent NSTEMI, A fib: - per primary team   Disposition - Continue inpatient monitoring   Estanislado Emms, MD 04/07/2023 6:59 AM

## 2023-04-07 NOTE — Progress Notes (Signed)
Heart Failure Navigator Progress Note  Assessed for Heart & Vascular TOC clinic readiness.  Patient does not meet criteria due to Advanced Heart Failure Team patient. Has follow up on 04/20/2023.   Navigator will sign off at this time.   Rhae Hammock, BSN, Scientist, clinical (histocompatibility and immunogenetics) Only

## 2023-04-07 NOTE — Progress Notes (Signed)
Physician notified of critical INR 5.6 Foye Clock).

## 2023-04-07 NOTE — Plan of Care (Signed)
INR 5.6. Plts 98. Liver Enzymes improving. Hold Eliquis tonight. CBC pending. Hopefully restart in AM. Repeat DIC and LFT in AM.

## 2023-04-07 NOTE — Progress Notes (Signed)
Date and time results received: 04/07/23 0412  Test: Lactic Acid  Critical Value: 2.1  Name of Provider Notified: Pola Corn

## 2023-04-08 ENCOUNTER — Inpatient Hospital Stay (HOSPITAL_COMMUNITY): Payer: Medicare Other

## 2023-04-08 DIAGNOSIS — I5023 Acute on chronic systolic (congestive) heart failure: Secondary | ICD-10-CM | POA: Diagnosis not present

## 2023-04-08 LAB — GLUCOSE, CAPILLARY
Glucose-Capillary: 87 mg/dL (ref 70–99)
Glucose-Capillary: 103 mg/dL — ABNORMAL HIGH (ref 70–99)
Glucose-Capillary: 213 mg/dL — ABNORMAL HIGH (ref 70–99)
Glucose-Capillary: 179 mg/dL — ABNORMAL HIGH (ref 70–99)
Glucose-Capillary: 138 mg/dL — ABNORMAL HIGH (ref 70–99)

## 2023-04-08 LAB — CBC
HCT: 36.5 % (ref 36.0–46.0)
Hemoglobin: 11.2 g/dL — ABNORMAL LOW (ref 12.0–15.0)
MCH: 29.2 pg (ref 26.0–34.0)
MCHC: 30.7 g/dL (ref 30.0–36.0)
MCV: 95.1 fL (ref 80.0–100.0)
Platelets: 166 10*3/uL (ref 150–400)
RBC: 3.84 MIL/uL — ABNORMAL LOW (ref 3.87–5.11)
RDW: 15.8 % — ABNORMAL HIGH (ref 11.5–15.5)
WBC: 6.7 10*3/uL (ref 4.0–10.5)
nRBC: 0 % (ref 0.0–0.2)

## 2023-04-08 LAB — BASIC METABOLIC PANEL
Anion gap: 8 (ref 5–15)
BUN: 58 mg/dL — ABNORMAL HIGH (ref 8–23)
CO2: 31 mmol/L (ref 22–32)
Calcium: 8.8 mg/dL — ABNORMAL LOW (ref 8.9–10.3)
Chloride: 101 mmol/L (ref 98–111)
Creatinine, Ser: 3.19 mg/dL — ABNORMAL HIGH (ref 0.44–1.00)
GFR, Estimated: 14 mL/min — ABNORMAL LOW (ref >60)
Glucose, Bld: 104 mg/dL — ABNORMAL HIGH (ref 70–99)
Potassium: 4.2 mmol/L (ref 3.5–5.1)
Sodium: 140 mmol/L (ref 135–145)

## 2023-04-08 LAB — COOXEMETRY PANEL
Carboxyhemoglobin: 1.7 % — ABNORMAL HIGH (ref 0.5–1.5)
Methemoglobin: <0.7 % (ref 0.0–1.5)
O2 Saturation: 61.6 %
Total hemoglobin: 11.8 g/dL — ABNORMAL LOW (ref 12.0–16.0)

## 2023-04-08 LAB — HEPATIC FUNCTION PANEL
ALT: 957 U/L — ABNORMAL HIGH (ref 0–44)
AST: 526 U/L — ABNORMAL HIGH (ref 15–41)
Albumin: 2.8 g/dL — ABNORMAL LOW (ref 3.5–5.0)
Alkaline Phosphatase: 204 U/L — ABNORMAL HIGH (ref 38–126)
Bilirubin, Direct: 0.3 mg/dL — ABNORMAL HIGH (ref 0.0–0.2)
Indirect Bilirubin: 1 mg/dL — ABNORMAL HIGH (ref 0.3–0.9)
Total Bilirubin: 1.3 mg/dL — ABNORMAL HIGH (ref 0.3–1.2)
Total Protein: 5.8 g/dL — ABNORMAL LOW (ref 6.5–8.1)

## 2023-04-08 LAB — DIC (DISSEMINATED INTRAVASCULAR COAGULATION)PANEL
D-Dimer, Quant: 15.18 ug/mL-FEU — ABNORMAL HIGH (ref 0.00–0.50)
Fibrinogen: 260 mg/dL (ref 210–475)
INR: 2.1 — ABNORMAL HIGH (ref 0.8–1.2)
Platelets: 166 10*3/uL (ref 150–400)
Prothrombin Time: 23 seconds — ABNORMAL HIGH (ref 11.4–15.2)
Smear Review: NONE SEEN
aPTT: 30 seconds (ref 24–36)

## 2023-04-08 LAB — PHOSPHORUS: Phosphorus: 3.5 mg/dL (ref 2.5–4.6)

## 2023-04-08 LAB — MAGNESIUM: Magnesium: 2.2 mg/dL (ref 1.7–2.4)

## 2023-04-08 MED ORDER — SODIUM CHLORIDE 0.9 % IV SOLN
1.0000 g | INTRAVENOUS | Status: DC
  Administered 2023-04-09 (×2): 1 g via INTRAVENOUS
  Filled 2023-04-08 (×2): qty 10

## 2023-04-08 MED ORDER — SODIUM CHLORIDE 0.9 % IV SOLN: 2.0000 g | INTRAVENOUS | Status: DC

## 2023-04-08 MED ORDER — FUROSEMIDE 10 MG/ML IJ SOLN
60.0000 mg | Freq: Once | INTRAMUSCULAR | Status: AC
  Administered 2023-04-08: 60 mg via INTRAVENOUS
  Filled 2023-04-08: qty 6

## 2023-04-08 MED ORDER — APIXABAN 2.5 MG PO TABS
2.5000 mg | ORAL_TABLET | Freq: Two times a day (BID) | ORAL | Status: DC
  Administered 2023-04-08 – 2023-04-16 (×17): 2.5 mg via ORAL
  Filled 2023-04-08 (×17): qty 1

## 2023-04-08 NOTE — Progress Notes (Signed)
[facility] Progress Note  Name: Natasha Woodard MRN: [id number] DOB: 11-11-1941  Chief Complaint:  Nausea/vomiting  Subjective:  She was transferred out to the floor.  She had 770 mL uop over 4/26.  She feels ok today.  Her husband is here at bedside.  She has been sitting in a chair this AM.  She asks about her kidneys and liver.   Review of systems:   She denies shortness of breath or chest pain.  She states that her breathing has gotten better than prior  Denies n/v or abdominal pain  ---------------- Background on consult:  Natasha Woodard is an 82 y.o. female with DM, CAD h/o MI 01/2023 (complicated course), HFpEF, PAD, OSA, A fib, CKD3 currently admitted with N/V, severe acidosis and AKI for which nephrology is consulted.  Prolonged hospitalization 01/11/23 - 02/19/23 MI, cardiac arrest, cardiogenic shock req IABP, s/p ICD discharged to CIR where she stayed 3/10-3/19/24.  Readmitted 4/19-22 with N/V/abd pain with unrevealing CT.  Found to have enterotoxigenic E. coli ; AKI Cr ~3 from recent baseline 1.3-1.6mg /dL with neg renal US.  Improved with fluids, withholding ARB and diuretics to 2.4 at discharge.  Returned to ED today with N/V - triage VS 122/64, O2 96% ra, afebrile.  Labs showing Na 139, K 6, Bicarb 11, BUN 57, Cr 4.3, AG 29, AST 804, ALT 723 (from normal 7/20), T bili 2.7 (normal 4/20), BNP 2550; VBG 7.06, 38, lactate 9.   CXR mild pulm edema vs infection (favor former).  Renal US normal.  Due to increased WOB placed on bipap.  Potassium managed medically with shifting and given 1 amp bicarb + bicarb gtt.  PCCM consulted for admission.  Pt currently awake and alert on bipap with family bedside.  N/V/abd pain with po intake but not with palpation x days -- dehydrated.  Was taking meds until today - inc torsemide 20 daily but doesn't appear to have been on MRB or ARB.  Decreased urine volume, normal character.   +LE edema recently.  No NSAID, no contrast.  Pt getting  central line and going to ICU.     Intake/Output Summary (Last 24 hours) at 04/08/2023 1116 Last data filed at 04/08/2023 0500 Gross per 24 hour  Intake 580 ml  Output 600 ml  Net -20 ml    Vitals:  Vitals:   04/08/23 0000 04/08/23 0347 04/08/23 0400 04/08/23 0500  BP:  110/75    Pulse: 70 70 70 71  Resp: (!) 22 20 16    Temp:  98.1 F (36.7 C)    TempSrc:  Oral    SpO2: 100% 100% 100% 100%  Weight:    74.7 kg  Height:         Physical Exam:   General elderly female in chair in no acute distress HEENT normocephalic atraumatic extraocular movements intact sclera anicteric Neck supple trachea midline Lungs clear to auscultation bilaterally normal work of breathing at rest on room air  Heart S1S2 no rub Abdomen soft nontender nondistended Extremities 1+ pitting edema lower extremities with legs dependent in chair Psych normal mood and affect Neuro alert and oriented x 3 provides hx and follows commands; hard of hearing  GU foley in place - hematuria has cleared  Medications reviewed   Labs:     Latest Ref Rng & Units 04/08/2023    3:55 AM 04/07/2023    3:07 PM 04/07/2023    3:31 AM  BMP  Glucose 70 - 99 mg/dL 409  135  155   BUN 8 - 23 mg/dL 58  66  67   Creatinine 0.44 - 1.00 mg/dL 1.61  0.96  0.45   Sodium 135 - 145 mmol/L 140  139  142   Potassium 3.5 - 5.1 mmol/L 4.2  4.2  4.4   Chloride 98 - 111 mmol/L 101  98  101   CO2 22 - 32 mmol/L 31  29  29    Calcium 8.9 - 10.3 mg/dL 8.8  8.5  8.8      Assessment/Plan:    **AKI, severe on CKD 3:  - Secondary to pre-renal insults - n/v as well as ongoing diuretic.  Some ischemic insults as well and may have had some progression to ATN.  Note prior AKI event.  Consider atypical HUS however recent E coli was non shiga toxin producing; doubt this as etiology.  Renal US normal echogenicity and no hydro.  BL Cr 1.5-1.8 - She is improving with supportive care - She is on a high dose of cefepime - watch renal function and  appreciate pharmacy   - lasix 60 mg IV once now  **severe anion gap metabolic acidosis:  - Improved  - s/p amps of bicarb and s/p bicarb gtt.    # Acute hypoxic respiratory failure - she is off of bicarb gtt given concern for fluid overload  - s/p lasix IV x 2 doses on 4/25  - optimize volume status   - lasix as above once  - oxygen as needed  # Possible Diverticulitis  - Scan with diverticulosis and possible mild diverticulitis  - per primary team    **Hyperkalemia:   - resolved with medical measures  - transitioned from heart healthy to renal diet    ** CKD stage 3  - note baseline Cr 1.5 -1.8  **N/V/abd pain:  recent ETEC  - per primary team.  CT scan as above    **Transaminitis:  trend per primary - suspected to be congestive and may have ischemic insult contributing as well; improving    **CAD s/p recent NSTEMI, A fib: - per primary team   Disposition - Continue inpatient monitoring   Estanislado Emms, MD 04/08/2023 11:36 AM

## 2023-04-08 NOTE — Evaluation (Signed)
Physical Therapy Evaluation Patient Details Name: Natasha Woodard MRN: [id number] DOB: January 21, 1941 Today's Date: 04/08/2023  History of Present Illness  82 y/o woman who presented with recurrent abdominal symptoms with early satiety, distention, diffuse pain throughout her abdomen, nausea, and vomiting. recent admission and was discharged on 04/03/23 with ETEC gastroenteritis. Admitted for severe metabolic acidosis, lactic acidosis, concern for decompensated heart failure PMH: NSTEMI and cardiogenic shock with a complicated and prolonged admission 1/30-3/10/24,  HFrEF, A-fib, AKI on CKD IVOSA, HTN, HLD  Clinical Impression  PTA pt living with husband in 2 story home with bed and bath on second floor. Pt ambulates with Rollator and is independent in ADLs, and light iADLs. Pt is currently limited in safe mobility by generalized weakness and minor balance deficits. Pt is mod I for bed mobility, min A for transfers and min guard for short distance ambulation with Rollator. Pt will need stair training prior to d/c. Pt would benefit from HHPT, continued acute PT and referral to Mobility Specialist.       Recommendations for follow up therapy are one component of a multi-disciplinary discharge planning process, led by the attending physician.  Recommendations may be updated based on patient status, additional functional criteria and insurance authorization.  Follow Up Recommendations       Assistance Recommended at Discharge Frequent or constant Supervision/Assistance  Patient can return home with the following  A little help with walking and/or transfers;A little help with bathing/dressing/bathroom;Assistance with cooking/housework;Assist for transportation;Help with stairs or ramp for entrance    Equipment Recommendations None recommended by PT     Functional Status Assessment Patient has had a recent decline in their functional status and demonstrates the ability to make significant improvements  in function in a reasonable and predictable amount of time.     Precautions / Restrictions Precautions Precautions: None Restrictions Weight Bearing Restrictions: No      Mobility  Bed Mobility Overal bed mobility: Modified Independent             General bed mobility comments: HoB elevated, minimal use of bed rail to come to EoB    Transfers Overall transfer level: Needs assistance Equipment used: 1 person hand held assist, Rollator (4 wheels) Transfers: Sit to/from Stand, Bed to chair/wheelchair/BSC Sit to Stand: Min assist, Min guard   Step pivot transfers: Min assist       General transfer comment: minA for steadying with initial power up from bed, min A for steadying with stepping to BSC, min guard for power up from [facility] to Rollator    Ambulation/Gait Ambulation/Gait assistance: Min guard Gait Distance (Feet): 16 Feet Assistive device: Rollator (4 wheels) Gait Pattern/deviations: Step-through pattern, Decreased step length - right, Decreased step length - left, Shuffle Gait velocity: slowed Gait velocity interpretation: <1.31 ft/sec, indicative of household ambulator   General Gait Details: min guard for safety and assist for lines and leads to walk around bed to recliner, further ambulation only deferred secondary to breakfast being present        Balance Overall balance assessment: Needs assistance Sitting-balance support: No upper extremity supported, Feet supported Sitting balance-Leahy Scale: Fair     Standing balance support: Single extremity supported, Bilateral upper extremity supported, During functional activity, Reliant on assistive device for balance Standing balance-Leahy Scale: Poor Standing balance comment: require support for balance                             Pertinent  Vitals/Pain Pain Assessment Pain Assessment: Faces Faces Pain Scale: Hurts little more Pain Location: back Pain Descriptors / Indicators: Aching,  Sore Pain Intervention(s): Limited activity within patient's tolerance, Monitored during session, Patient requesting pain meds-RN notified, Repositioned    Home Living Family/patient expects to be discharged to:: Private residence Living Arrangements: Spouse/significant other Available Help at Discharge: Family;Available 24 hours/day Type of Home: House Home Access: Stairs to enter Entrance Stairs-Rails: Doctor, general practice of Steps: 2 Alternate Level Stairs-Number of Steps: 14 Home Layout: Two level;Bed/bath upstairs Home Equipment: Cane - single point      Prior Function Prior Level of Function : Independent/Modified Independent;Driving             Mobility Comments: uses Rollator ADLs Comments: independent with ADLs, assist for iADLs     Hand Dominance   Dominant Hand: Right    Extremity/Trunk Assessment   Upper Extremity Assessment Upper Extremity Assessment: Defer to OT evaluation    Lower Extremity Assessment Lower Extremity Assessment: Generalized weakness    Cervical / Trunk Assessment Cervical / Trunk Assessment: Kyphotic  Communication   Communication: No difficulties  Cognition Arousal/Alertness: Awake/alert Behavior During Therapy: WFL for tasks assessed/performed Overall Cognitive Status: Within Functional Limits for tasks assessed                                          General Comments General comments (skin integrity, edema, etc.): VSS on RA        Assessment/Plan    PT Assessment Patient needs continued PT services  PT Problem List Decreased strength;Decreased activity tolerance;Decreased balance;Cardiopulmonary status limiting activity       PT Treatment Interventions DME instruction;Gait training;Functional mobility training;Stair training;Therapeutic activities;Therapeutic exercise;Balance training;Cognitive remediation;Patient/family education    PT Goals (Current goals can be found in the Care Plan  section)  Acute Rehab PT Goals Patient Stated Goal: go home PT Goal Formulation: With patient Time For Goal Achievement: 04/22/23 Potential to Achieve Goals: Good    Frequency Min 1X/week        AM-PAC PT "6 Clicks" Mobility  Outcome Measure Help needed turning from your back to your side while in a flat bed without using bedrails?: None Help needed moving from lying on your back to sitting on the side of a flat bed without using bedrails?: None Help needed moving to and from a bed to a chair (including a wheelchair)?: A Little Help needed standing up from a chair using your arms (e.g., wheelchair or bedside chair)?: A Little Help needed to walk in hospital room?: A Little Help needed climbing 3-5 steps with a railing? : A Lot 6 Click Score: 19    End of Session   Activity Tolerance: Patient tolerated treatment well Patient left: in chair;with call bell/phone within reach Nurse Communication: Mobility status;Other (comment) (BM on [facility]) PT Visit Diagnosis: Muscle weakness (generalized) (M62.81)    Time: 7829-5621 PT Time Calculation (min) (ACUTE ONLY): 33 min   Charges:   PT Evaluation $PT Eval Moderate Complexity: 1 Mod PT Treatments $Therapeutic Activity: 8-22 mins        Raynell Scott B. Beverely Risen PT, DPT Acute Rehabilitation Services Please use secure chat or  Call Office [phone]   [name] [facility] 04/08/2023, 10:25 AM

## 2023-04-08 NOTE — Progress Notes (Signed)
PROGRESS NOTE    Natasha Woodard  UJW:[id number] DOB: 1941-03-18 DOA: 04/05/2023 PCP: [name], MD  81/F with prior nonischemic cardiomyopathy, EF had recovered and then admitted with lateral wall MI in 2/24, cath with distal circumflex occlusion, complicated by severe ischemic MR, low output heart failure.  Required balloon pump, subsequently removed for limb ischemia concerns, developed right femoral pseudoaneurysm. Had VT arrest 2/8, ICD placed 2/12  -Treated with prolonged course of milrinone, diuretics etc. and had multiple surgeries of right groin, debridement, wound VAC placement and change etc. subsequently went to CIR and discharged home 3/19. - Readmitted 4/19-22 with N/V/abd pain with unrevealing CT.  Found to have enterotoxigenic E. coli ; AKI Cr ~3 from recent baseline 1.3-1.6mg /dL . Improved with fluids, withholding ARB and diuretics to 2.4 at discharge.   -Return to the ED 4/24 with N/V and dyspnea-labs noted, K 6, Bicarb 11, BUN 57, Cr 4.3, AG 29, AST 804, ALT 723, BNP 2550; VBG 7.06, 38, lactate 9.   CXR mild pulm edema, due to increased WOB placed on bipap.  She was placed on bicarb drip and admitted to the ICU. -Cards and nephrology following, concern for low output failure -Has not been diuresed on account of renal failure -Echo noted EF less than 20%, severely elevated PA systolic pressure, severe MR -Transferred to [place] service today from ICU   Subjective: -Feels okay, off BiPAP this morning  Assessment and Plan:  Acute on chronic systolic CHF -Echo with severe biventricular failure, EF less than 20%, moderately reduced RV, regional wall motion abnormalities, moderate to severe mitral regurgitation -I am concerned that she had either cardiogenic shock +- septic in ICU with resultant severe ATN, lactic and metabolic acidosis, shock liver -Cards following, coox 57 yesterday, CVP is elevated -Benefit from further diuresis, defer to cards, nephrology  Moderate to  severe mitral regurgitation -TEE last month noted moderate MR, likely ischemic  History of CAD -Treated with PCI of left circumflex in 1/24 -Currently on Plavix and Eliquis in the setting of A-fib  AKI on CKD3b -Baseline creatinine 1.5-1.8, peaked at 4.4 this admission, now improving, down to 3.1 today -Suspect ATN in the setting of shock, prerenal etiology likely also contributing -Nephrology following, no hydronephrosis on imaging -Monitor urine output, BMP in a.m.  Shock liver -Improving  History of recent VT arrest -ICD placed 2/12 -Continue amiodarone -Electrolytes are stable  Recent gastroenteritis, positive for enterotoxigenic E. Coli -CT with anasarca and bowel wall thickening, do not suspect diverticulitis -Currently on IV cefepime for UTI  Enterobacter and Proteus UTI -Continue cefepime, follow-up sensitivities  Paroxysmal atrial fibrillation -Currently in sinus rhythm, resume apixaban -Continue oral amiodarone  DVT prophylaxis: Eliquis Code Status: DNR Family Communication: None present Disposition Plan: To be determined  Consultants:    Procedures:   Antimicrobials:    Objective: Vitals:   04/08/23 0000 04/08/23 0347 04/08/23 0400 04/08/23 0500  BP:  110/75    Pulse: 70 70 70 71  Resp: (!) 22 20 16    Temp:  98.1 F (36.7 C)    TempSrc:  Oral    SpO2: 100% 100% 100% 100%  Weight:    74.7 kg  Height:        Intake/Output Summary (Last 24 hours) at 04/08/2023 1113 Last data filed at 04/08/2023 0500 Gross per 24 hour  Intake 580 ml  Output 600 ml  Net -20 ml   Filed Weights   04/05/23 1717 04/06/23 1200 04/08/23 0500  Weight: 70.5 kg  77.2 kg 74.7 kg    Examination:  General exam: Chronically ill elderly female sitting up in bed, AAOx3 HEENT: Taut skin unable to assess JVD, right IJ central line noted CVS: S1-S2, regular rhythm, systolic murmur Lungs: Poor air movement bilaterally Abdomen: Soft, obese, nontender, bowel sounds  present Extremities: Trace edema Skin: No rashes Psychiatry:  Mood & affect appropriate.     Data Reviewed:   CBC: Recent Labs  Lab 04/05/23 1820 04/05/23 1839 04/06/23 0058 04/06/23 0751 04/07/23 0331 04/07/23 1327 04/07/23 1600 04/08/23 0355  WBC 6.5  --  12.2*  --  8.7  --  8.3 6.7  NEUTROABS 5.4  --   --   --   --   --   --   --   HGB 13.7   < > 11.9* 12.9 11.4*  --  10.9* 11.2*  HCT 47.1*   < > 40.7 38.0 35.6*  --  34.0* 36.5  MCV 100.6*  --  101.2*  --  93.0  --  93.7 95.1  PLT 251  --  215  --  175 98* 162 166  166   < > = values in this interval not displayed.   Basic Metabolic Panel: Recent Labs  Lab 04/02/23 0154 04/03/23 0241 04/06/23 0058 04/06/23 0729 04/06/23 0751 04/06/23 1509 04/07/23 0331 04/07/23 1507 04/08/23 0355  NA 138   < > 136 140 139 142 142 139 140  K 4.6   < > 6.0* 4.8 5.0 4.5 4.4 4.2 4.2  CL 102   < > 97* 97*  --  99 101 98 101  CO2 25   < > 11* 25  --  29 29 29 31   GLUCOSE 135*   < > 260* 148*  --  92 155* 135* 104*  BUN 39*   < > 62* 62*  --  62* 67* 66* 58*  CREATININE 2.85*   < > 4.22* 4.10*  --  4.08* 3.95* 3.76* 3.19*  CALCIUM 9.2   < > 9.6 9.5  --  9.2 8.8* 8.5* 8.8*  MG 1.9  --  2.4  --   --   --  2.3  --  2.2  PHOS  --   --  7.4*  --   --   --  4.8*  --  3.5   < > = values in this interval not displayed.   GFR: Estimated Creatinine Clearance: 12.9 mL/min (A) (by C-G formula based on SCr of 3.19 mg/dL (H)). Liver Function Tests: Recent Labs  Lab 04/05/23 1820 04/06/23 1509 04/07/23 1507 04/08/23 0355  AST 804* 2,195* 823* 526*  ALT 723* 1,643* 1,119* 957*  ALKPHOS 262* 212* 229* 204*  BILITOT 2.7* 1.8* 1.2 1.3*  PROT 7.2 6.2* 5.6* 5.8*  ALBUMIN 3.7 3.4* 2.7* 2.8*   Recent Labs  Lab 04/05/23 1820  LIPASE 28   No results for input(s): "AMMONIA" in the last 168 hours. Coagulation Profile: Recent Labs  Lab 04/07/23 1327 04/08/23 0355  INR 5.6* 2.1*   Cardiac Enzymes: No results for input(s): "CKTOTAL",  "CKMB", "CKMBINDEX", "TROPONINI" in the last 168 hours. BNP (last 3 results) Recent Labs    06/09/22 1506  PROBNP 4,638*   HbA1C: No results for input(s): "HGBA1C" in the last 72 hours. CBG: Recent Labs  Lab 04/07/23 1134 04/07/23 1631 04/07/23 2024 04/08/23 0621 04/08/23 0748  GLUCAP 218* 86 153* 87 103*   Lipid Profile: No results for input(s): "CHOL", "HDL", "LDLCALC", "TRIG", "CHOLHDL", "LDLDIRECT" in  the last 72 hours. Thyroid Function Tests: No results for input(s): "TSH", "T4TOTAL", "FREET4", "T3FREE", "THYROIDAB" in the last 72 hours. Anemia Panel: No results for input(s): "VITAMINB12", "FOLATE", "FERRITIN", "TIBC", "IRON", "RETICCTPCT" in the last 72 hours. Urine analysis:    Component Value Date/Time   COLORURINE YELLOW 04/06/2023 1304   APPEARANCEUR CLEAR 04/06/2023 1304   LABSPEC 1.025 04/06/2023 1304   PHURINE 5.5 04/06/2023 1304   GLUCOSEU NEGATIVE 04/06/2023 1304   HGBUR LARGE (A) 04/06/2023 1304   BILIRUBINUR NEGATIVE 04/06/2023 1304   KETONESUR NEGATIVE 04/06/2023 1304   PROTEINUR 30 (A) 04/06/2023 1304   NITRITE NEGATIVE 04/06/2023 1304   LEUKOCYTESUR SMALL (A) 04/06/2023 1304   Sepsis Labs: @LABRCNTIP (procalcitonin:4,lacticidven:4)  ) Recent Results (from the past 240 hour(s))  Gastrointestinal Panel by PCR , Stool     Status: Abnormal   Collection Time: 03/31/23  7:56 AM   Specimen: Stool  Result Value Ref Range Status   Campylobacter species NOT DETECTED NOT DETECTED Final   Plesimonas shigelloides NOT DETECTED NOT DETECTED Final   Salmonella species NOT DETECTED NOT DETECTED Final   Yersinia enterocolitica NOT DETECTED NOT DETECTED Final   Vibrio species NOT DETECTED NOT DETECTED Final   Vibrio cholerae NOT DETECTED NOT DETECTED Final   Enteroaggregative E coli (EAEC) NOT DETECTED NOT DETECTED Final   Enteropathogenic E coli (EPEC) NOT DETECTED NOT DETECTED Final   Enterotoxigenic E coli (ETEC) DETECTED (A) NOT DETECTED Final    Comment:  CRITICAL RESULT CALLED TO, READ BACK BY AND VERIFIED WITH: [facility] 04/02/2023 AT 0044 SRR    Shiga like toxin producing E coli (STEC) NOT DETECTED NOT DETECTED Final   Shigella/Enteroinvasive E coli (EIEC) NOT DETECTED NOT DETECTED Final   Cryptosporidium NOT DETECTED NOT DETECTED Final   Cyclospora cayetanensis NOT DETECTED NOT DETECTED Final   Entamoeba histolytica NOT DETECTED NOT DETECTED Final   Giardia lamblia NOT DETECTED NOT DETECTED Final   Adenovirus F40/41 NOT DETECTED NOT DETECTED Final   Astrovirus NOT DETECTED NOT DETECTED Final   Norovirus GI/GII NOT DETECTED NOT DETECTED Final   Rotavirus A NOT DETECTED NOT DETECTED Final   Sapovirus (I, II, IV, and V) NOT DETECTED NOT DETECTED Final    Comment: Performed at [facility], [address], [place], [place] [address]  Urine Culture     Status: Abnormal   Collection Time: 03/31/23  7:06 PM   Specimen: Urine, Random  Result Value Ref Range Status   Specimen Description URINE, RANDOM  Final   Special Requests   Final    URINE, CATHETERIZED Performed at [facility], [address] [place], [place] [address]    Culture (A)  Final    >=100,000 COLONIES/mL ENTEROBACTER AEROGENES 10,000 COLONIES/mL PROTEUS MIRABILIS    Report Status 04/04/2023 FINAL  Final   Organism ID, Bacteria ENTEROBACTER AEROGENES (A)  Final   Organism ID, Bacteria PROTEUS MIRABILIS (A)  Final      Susceptibility   Enterobacter aerogenes - MIC*    CEFEPIME <=0.12 SENSITIVE Sensitive     CEFTRIAXONE <=0.25 SENSITIVE Sensitive     CIPROFLOXACIN <=0.25 SENSITIVE Sensitive     GENTAMICIN <=1 SENSITIVE Sensitive     IMIPENEM 1 SENSITIVE Sensitive     NITROFURANTOIN 128 RESISTANT Resistant     TRIMETH/SULFA <=20 SENSITIVE Sensitive     PIP/TAZO <=4 SENSITIVE Sensitive     * >=100,000 COLONIES/mL ENTEROBACTER AEROGENES   Proteus mirabilis - MIC*    AMPICILLIN <=2 SENSITIVE Sensitive  CEFAZOLIN <=4 SENSITIVE Sensitive     CEFEPIME  <=0.12 SENSITIVE Sensitive     CEFTRIAXONE <=0.25 SENSITIVE Sensitive     CIPROFLOXACIN <=0.25 SENSITIVE Sensitive     GENTAMICIN <=1 SENSITIVE Sensitive     IMIPENEM 0.5 SENSITIVE Sensitive     NITROFURANTOIN 128 RESISTANT Resistant     TRIMETH/SULFA <=20 SENSITIVE Sensitive     AMPICILLIN/SULBACTAM <=2 SENSITIVE Sensitive     PIP/TAZO <=4 SENSITIVE Sensitive     * 10,000 COLONIES/mL PROTEUS MIRABILIS  MRSA Next Gen by PCR, Nasal     Status: None   Collection Time: 04/06/23  3:12 PM   Specimen: Nasal Mucosa; Nasal Swab  Result Value Ref Range Status   MRSA by PCR Next Gen NOT DETECTED NOT DETECTED Final    Comment: (NOTE) The GeneXpert MRSA Assay (FDA approved for NASAL specimens only), is one component of a comprehensive MRSA colonization surveillance program. It is not intended to diagnose MRSA infection nor to guide or monitor treatment for MRSA infections. Test performance is not FDA approved in patients less than 67 years old. Performed at [facility], [address], [place], [place] [address]      Radiology Studies: DG CHEST PORT 1 VIEW  Result Date: 04/07/2023 CLINICAL DATA:  Abnormal respiration. EXAM: PORTABLE CHEST 1 VIEW COMPARISON:  04/05/2023 FINDINGS: The cardio pericardial silhouette is enlarged. There is pulmonary vascular congestion without overt pulmonary edema. Left pacer/AICD again noted. Right IJ central line tip overlies the SVC/RA junction. IMPRESSION: Enlargement of the cardiopericardial silhouette with pulmonary vascular congestion. Electronically Signed   By: Kennith Center M.D.   On: 04/07/2023 06:10   US Abdomen Limited RUQ (LIVER/GB)  Result Date: 04/06/2023 CLINICAL DATA:  Acute cholecystitis. EXAM: ULTRASOUND ABDOMEN LIMITED RIGHT UPPER QUADRANT COMPARISON:  None Available. FINDINGS: Gallbladder: No gallstones or wall thickening visualized. No sonographic Murphy sign noted by sonographer. Common bile duct: Diameter: 3.5 mm Liver: Within normal  limits in parenchymal echogenicity. 0.3 x 1.3 x 1.8 cm hypoechoic mass in the subcapsular left lobe of the liver with appearance of a cyst. No internal blood flow. Portal vein is patent on color Doppler imaging with normal direction of blood flow towards the liver. Other: Right pleural effusion. IMPRESSION: Right pleural effusion. No evidence of acute cholecystitis or cholelithiasis. 1.8 cm benign-appearing subcapsular cyst in the left lobe of the liver. Electronically Signed   By: Ted Mcalpine M.D.   On: 04/06/2023 15:55   ECHOCARDIOGRAM COMPLETE  Result Date: 04/06/2023    ECHOCARDIOGRAM REPORT   Patient Name:   Natasha Woodard Date of Exam: 04/06/2023 Medical Rec #:  [id number]         Height:       62.0 in Accession #:    [id number]        Weight:       155.4 lb Date of Birth:  03-01-1941         BSA:          1.717 m Patient Age:    81 years          BP:           118/76 mmHg Patient Gender: F                 HR:           71 bpm. Exam Location:  Inpatient Procedure: 2D Echo, Cardiac Doppler and Color Doppler Indications:    CHF  History:  Patient has prior history of Echocardiogram examinations, most                 recent 01/18/2023. CHF, Previous Myocardial Infarction, Mitral                 Valve Disease; Risk Factors:Diabetes, Dyslipidemia and                 Hypertension.  Sonographer:    Milbert Coulter Referring Phys: [id number] LAURA P CLARK IMPRESSIONS  1. Left ventricular ejection fraction, by estimation, is <20%. The left ventricle has severely decreased function. The left ventricle demonstrate severe global hypokinesis and regional wall motion abnormalities (see scoring diagram/findings for description). The left ventricular internal cavity size was mildly dilated. Left ventricular diastolic parameters are consistent with Grade III diastolic dysfunction (restrictive). Elevated left ventricular end-diastolic pressure. There is akinesis of the left ventricular, mid-apical lateral wall and  inferolateral wall. There is akinesis of the left ventricular, apical segment.  2. Right ventricular systolic function is moderately reduced. The right ventricular size is normal. There is severely elevated pulmonary artery systolic pressure. The estimated right ventricular systolic pressure is 72.5 mmHg.  3. Left atrial size was severely dilated.  4. Right atrial size was mildly dilated.  5. Based on eccentricity of MR and akinesis of the inferolateral and lateral walls, suspect this is ischemic MR due to restricted posterior mitral valve leaflet.. The mitral valve is abnormal. Moderate to severe mitral valve regurgitation. No evidence of mitral stenosis.  6. The aortic valve is tricuspid. Aortic valve regurgitation is trivial. Aortic valve sclerosis/calcification is present, without any evidence of aortic stenosis. Aortic valve area, by VTI measures 1.81 cm. Aortic valve mean gradient measures 3.0 mmHg.  Aortic valve Vmax measures 1.15 m/s.  7. The inferior vena cava is dilated in size with <50% respiratory variability, suggesting right atrial pressure of 15 mmHg. FINDINGS  Left Ventricle: Left ventricular ejection fraction, by estimation, is <20%. The left ventricle has severely decreased function. The left ventricle demonstrates regional wall motion abnormalities. The left ventricular internal cavity size was mildly dilated. There is no left ventricular hypertrophy. Left ventricular diastolic parameters are consistent with Grade III diastolic dysfunction (restrictive). Elevated left ventricular end-diastolic pressure. Right Ventricle: The right ventricular size is normal. No increase in right ventricular wall thickness. Right ventricular systolic function is moderately reduced. There is severely elevated pulmonary artery systolic pressure. The tricuspid regurgitant velocity is 3.79 m/s, and with an assumed right atrial pressure of 15 mmHg, the estimated right ventricular systolic pressure is 72.5 mmHg. Left  Atrium: Left atrial size was severely dilated. Right Atrium: Right atrial size was mildly dilated. Pericardium: There is no evidence of pericardial effusion. Mitral Valve: Based on eccentricity of MR and akinesis of the inferolateral and lateral walls, suspect this is ischemic MR due to restricted posterior mitral valve leaflet. The mitral valve is abnormal. Moderate to severe mitral valve regurgitation, with  eccentric posteriorly directed jet. No evidence of mitral valve stenosis. Tricuspid Valve: The tricuspid valve is normal in structure. Tricuspid valve regurgitation is mild . No evidence of tricuspid stenosis. Aortic Valve: The aortic valve is tricuspid. Aortic valve regurgitation is trivial. Aortic valve sclerosis/calcification is present, without any evidence of aortic stenosis. Aortic valve mean gradient measures 3.0 mmHg. Aortic valve peak gradient measures 5.3 mmHg. Aortic valve area, by VTI measures 1.81 cm. Pulmonic Valve: The pulmonic valve was normal in structure. Pulmonic valve regurgitation is not visualized. No evidence of pulmonic stenosis. Aorta:  The aortic root is normal in size and structure. Venous: The inferior vena cava is dilated in size with less than 50% respiratory variability, suggesting right atrial pressure of 15 mmHg. IAS/Shunts: No atrial level shunt detected by color flow Doppler. Additional Comments: A device lead is visualized.  LEFT VENTRICLE PLAX 2D LVIDd:         5.60 cm      Diastology LVIDs:         5.30 cm      LV e' medial:    4.24 cm/s LV PW:         0.90 cm      LV E/e' medial:  34.0 LV IVS:        1.00 cm      LV e' lateral:   8.49 cm/s LVOT diam:     2.00 cm      LV E/e' lateral: 17.0 LV SV:         38 LV SV Index:   22 LVOT Area:     3.14 cm  LV Volumes (MOD) LV vol d, MOD A2C: 141.0 ml LV vol d, MOD A4C: 115.0 ml LV vol s, MOD A2C: 97.1 ml LV vol s, MOD A4C: 110.0 ml LV SV MOD A2C:     43.9 ml LV SV MOD A4C:     115.0 ml LV SV MOD BP:      22.3 ml RIGHT VENTRICLE  RV Basal diam:  3.90 cm RV Mid diam:    3.10 cm RV S prime:     9.36 cm/s TAPSE (M-mode): 1.9 cm LEFT ATRIUM              Index        RIGHT ATRIUM           Index LA diam:        5.30 cm  3.09 cm/m   RA Area:     19.60 cm LA Vol (A2C):   118.0 ml 68.71 ml/m  RA Volume:   50.80 ml  29.58 ml/m LA Vol (A4C):   109.0 ml 63.47 ml/m LA Biplane Vol: 114.0 ml 66.38 ml/m  AORTIC VALVE AV Area (Vmax):    2.33 cm AV Area (Vmean):   2.09 cm AV Area (VTI):     1.81 cm AV Vmax:           115.00 cm/s AV Vmean:          75.600 cm/s AV VTI:            0.212 m AV Peak Grad:      5.3 mmHg AV Mean Grad:      3.0 mmHg LVOT Vmax:         85.40 cm/s LVOT Vmean:        50.300 cm/s LVOT VTI:          0.122 m LVOT/AV VTI ratio: 0.58 MITRAL VALVE                  TRICUSPID VALVE MV Area (PHT): 5.02 cm       TR Peak grad:   57.5 mmHg MV Decel Time: 151 msec       TR Vmax:        379.00 cm/s MR Peak grad:    131.3 mmHg MR Mean grad:    86.0 mmHg    SHUNTS MR Vmax:         573.00 cm/s  Systemic VTI:  0.12 m MR Vmean:  444.0 cm/s   Systemic Diam: 2.00 cm MR PISA:         4.02 cm MR PISA Eff ROA: 16 mm MR PISA Radius:  0.80 cm MV E velocity: 144.00 cm/s MV A velocity: 50.80 cm/s MV E/A ratio:  2.83 Armanda Magic MD Electronically signed by Armanda Magic MD Signature Date/Time: 04/06/2023/3:50:45 PM    Final      Scheduled Meds:  amiodarone  200 mg Oral Daily   apixaban  2.5 mg Oral BID   clopidogrel  75 mg Oral Daily   hydrocerin   Topical BID   insulin aspart  1-3 Units Subcutaneous TID AC & HS   Continuous Infusions:  [START ON 04/09/2023] ceFEPime (MAXIPIME) IV       LOS: 3 days    Time spent:    Zannie Cove, MD [facility]   04/08/2023, 11:13 AM

## 2023-04-08 NOTE — Progress Notes (Signed)
Rounding Note    Patient Name: Natasha Woodard Date of Encounter: 04/08/2023  [facility] Cardiologist: Bryan Lemma, MD   Subjective   No complaints  Inpatient Medications    Scheduled Meds:  amiodarone  200 mg Oral Daily   apixaban  2.5 mg Oral BID   Chlorhexidine Gluconate Cloth  6 each Topical Daily   clopidogrel  75 mg Oral Daily   hydrocerin   Topical BID   insulin aspart  1-3 Units Subcutaneous TID AC & HS   Continuous Infusions:  [START ON 04/09/2023] ceFEPime (MAXIPIME) IV     PRN Meds: docusate sodium, ondansetron (ZOFRAN) IV, ondansetron, mouth rinse, polyethylene glycol, traMADol, white petrolatum   Vital Signs    Vitals:   04/08/23 0000 04/08/23 0347 04/08/23 0400 04/08/23 0500  BP:  110/75    Pulse: 70 70 70 71  Resp: (!) 22 20 16    Temp:  98.1 F (36.7 C)    TempSrc:  Oral    SpO2: 100% 100% 100% 100%  Weight:    74.7 kg  Height:        Intake/Output Summary (Last 24 hours) at 04/08/2023 0926 Last data filed at 04/08/2023 0500 Gross per 24 hour  Intake 816 ml  Output 700 ml  Net 116 ml      04/08/2023    5:00 AM 04/06/2023   12:00 PM 04/05/2023    5:17 PM  Last 3 Weights  Weight (lbs) 164 lb 11.2 oz 170 lb 3.1 oz 155 lb 6.8 oz  Weight (kg) 74.707 kg 77.2 kg 70.5 kg      Telemetry    A paced V sensed - Personally Reviewed  ECG    N/a - Personally Reviewed  Physical Exam   GEN: No acute distress.   Neck: No JVD Cardiac: RRR, no murmurs, rubs, or gallops.  Respiratory: Clear to auscultation bilaterally. GI: Soft, nontender, non-distended  MS: No edema; No deformity. Neuro:  Nonfocal  Psych: Normal affect   Labs    High Sensitivity Troponin:   Recent Labs  Lab 03/31/23 0706 03/31/23 0930 04/05/23 1820 04/05/23 2050  TROPONINIHS 45* 40* 75* 76*     Chemistry Recent Labs  Lab 04/06/23 0058 04/06/23 0729 04/06/23 1509 04/07/23 0331 04/07/23 1507 04/08/23 0355  NA 136   < > 142 142 139 140  K 6.0*    < > 4.5 4.4 4.2 4.2  CL 97*   < > 99 101 98 101  CO2 11*   < > 29 29 29 31   GLUCOSE 260*   < > 92 155* 135* 104*  BUN 62*   < > 62* 67* 66* 58*  CREATININE 4.22*   < > 4.08* 3.95* 3.76* 3.19*  CALCIUM 9.6   < > 9.2 8.8* 8.5* 8.8*  MG 2.4  --   --  2.3  --  2.2  PROT  --   --  6.2*  --  5.6* 5.8*  ALBUMIN  --   --  3.4*  --  2.7* 2.8*  AST  --   --  2,195*  --  823* 526*  ALT  --   --  1,643*  --  1,119* 957*  ALKPHOS  --   --  212*  --  229* 204*  BILITOT  --   --  1.8*  --  1.2 1.3*  GFRNONAA 10*   < > 10* 11* 11* 14*  ANIONGAP 28*   < > 14 12  12 8   < > = values in this interval not displayed.    Lipids No results for input(s): "CHOL", "TRIG", "HDL", "LABVLDL", "LDLCALC", "CHOLHDL" in the last 168 hours.  Hematology Recent Labs  Lab 04/07/23 0331 04/07/23 1327 04/07/23 1600 04/08/23 0355  WBC 8.7  --  8.3 6.7  RBC 3.83*  --  3.63* 3.84*  HGB 11.4*  --  10.9* 11.2*  HCT 35.6*  --  34.0* 36.5  MCV 93.0  --  93.7 95.1  MCH 29.8  --  30.0 29.2  MCHC 32.0  --  32.1 30.7  RDW 15.7*  --  15.7* 15.8*  PLT 175 98* 162 166  166   Thyroid No results for input(s): "TSH", "FREET4" in the last 168 hours.  BNP Recent Labs  Lab 04/05/23 1820 04/06/23 0058  BNP 2,550.1* 2,843.8*    DDimer  Recent Labs  Lab 04/07/23 1327 04/08/23 0355  DDIMER 7.98* 15.18*     Radiology    DG CHEST PORT 1 VIEW  Result Date: 04/07/2023 CLINICAL DATA:  Abnormal respiration. EXAM: PORTABLE CHEST 1 VIEW COMPARISON:  04/05/2023 FINDINGS: The cardio pericardial silhouette is enlarged. There is pulmonary vascular congestion without overt pulmonary edema. Left pacer/AICD again noted. Right IJ central line tip overlies the SVC/RA junction. IMPRESSION: Enlargement of the cardiopericardial silhouette with pulmonary vascular congestion. Electronically Signed   By: Kennith Center M.D.   On: 04/07/2023 06:10   US Abdomen Limited RUQ (LIVER/GB)  Result Date: 04/06/2023 CLINICAL DATA:  Acute cholecystitis.  EXAM: ULTRASOUND ABDOMEN LIMITED RIGHT UPPER QUADRANT COMPARISON:  None Available. FINDINGS: Gallbladder: No gallstones or wall thickening visualized. No sonographic Murphy sign noted by sonographer. Common bile duct: Diameter: 3.5 mm Liver: Within normal limits in parenchymal echogenicity. 0.3 x 1.3 x 1.8 cm hypoechoic mass in the subcapsular left lobe of the liver with appearance of a cyst. No internal blood flow. Portal vein is patent on color Doppler imaging with normal direction of blood flow towards the liver. Other: Right pleural effusion. IMPRESSION: Right pleural effusion. No evidence of acute cholecystitis or cholelithiasis. 1.8 cm benign-appearing subcapsular cyst in the left lobe of the liver. Electronically Signed   By: Ted Mcalpine M.D.   On: 04/06/2023 15:55   ECHOCARDIOGRAM COMPLETE  Result Date: 04/06/2023    ECHOCARDIOGRAM REPORT   Patient Name:   KORYNN KENEDY Date of Exam: 04/06/2023 Medical Rec #:  [id number]         Height:       62.0 in Accession #:    [id number]        Weight:       155.4 lb Date of Birth:  Jul 25, 1941         BSA:          1.717 m Patient Age:    81 years          BP:           118/76 mmHg Patient Gender: F                 HR:           71 bpm. Exam Location:  Inpatient Procedure: 2D Echo, Cardiac Doppler and Color Doppler Indications:    CHF  History:        Patient has prior history of Echocardiogram examinations, most                 recent 01/18/2023. CHF, Previous Myocardial Infarction, Mitral  Valve Disease; Risk Factors:Diabetes, Dyslipidemia and                 Hypertension.  Sonographer:    Milbert Coulter Referring Phys: [id number] LAURA P CLARK IMPRESSIONS  1. Left ventricular ejection fraction, by estimation, is <20%. The left ventricle has severely decreased function. The left ventricle demonstrate severe global hypokinesis and regional wall motion abnormalities (see scoring diagram/findings for description). The left ventricular internal  cavity size was mildly dilated. Left ventricular diastolic parameters are consistent with Grade III diastolic dysfunction (restrictive). Elevated left ventricular end-diastolic pressure. There is akinesis of the left ventricular, mid-apical lateral wall and inferolateral wall. There is akinesis of the left ventricular, apical segment.  2. Right ventricular systolic function is moderately reduced. The right ventricular size is normal. There is severely elevated pulmonary artery systolic pressure. The estimated right ventricular systolic pressure is 72.5 mmHg.  3. Left atrial size was severely dilated.  4. Right atrial size was mildly dilated.  5. Based on eccentricity of MR and akinesis of the inferolateral and lateral walls, suspect this is ischemic MR due to restricted posterior mitral valve leaflet.. The mitral valve is abnormal. Moderate to severe mitral valve regurgitation. No evidence of mitral stenosis.  6. The aortic valve is tricuspid. Aortic valve regurgitation is trivial. Aortic valve sclerosis/calcification is present, without any evidence of aortic stenosis. Aortic valve area, by VTI measures 1.81 cm. Aortic valve mean gradient measures 3.0 mmHg.  Aortic valve Vmax measures 1.15 m/s.  7. The inferior vena cava is dilated in size with <50% respiratory variability, suggesting right atrial pressure of 15 mmHg. FINDINGS  Left Ventricle: Left ventricular ejection fraction, by estimation, is <20%. The left ventricle has severely decreased function. The left ventricle demonstrates regional wall motion abnormalities. The left ventricular internal cavity size was mildly dilated. There is no left ventricular hypertrophy. Left ventricular diastolic parameters are consistent with Grade III diastolic dysfunction (restrictive). Elevated left ventricular end-diastolic pressure. Right Ventricle: The right ventricular size is normal. No increase in right ventricular wall thickness. Right ventricular systolic function is  moderately reduced. There is severely elevated pulmonary artery systolic pressure. The tricuspid regurgitant velocity is 3.79 m/s, and with an assumed right atrial pressure of 15 mmHg, the estimated right ventricular systolic pressure is 72.5 mmHg. Left Atrium: Left atrial size was severely dilated. Right Atrium: Right atrial size was mildly dilated. Pericardium: There is no evidence of pericardial effusion. Mitral Valve: Based on eccentricity of MR and akinesis of the inferolateral and lateral walls, suspect this is ischemic MR due to restricted posterior mitral valve leaflet. The mitral valve is abnormal. Moderate to severe mitral valve regurgitation, with  eccentric posteriorly directed jet. No evidence of mitral valve stenosis. Tricuspid Valve: The tricuspid valve is normal in structure. Tricuspid valve regurgitation is mild . No evidence of tricuspid stenosis. Aortic Valve: The aortic valve is tricuspid. Aortic valve regurgitation is trivial. Aortic valve sclerosis/calcification is present, without any evidence of aortic stenosis. Aortic valve mean gradient measures 3.0 mmHg. Aortic valve peak gradient measures 5.3 mmHg. Aortic valve area, by VTI measures 1.81 cm. Pulmonic Valve: The pulmonic valve was normal in structure. Pulmonic valve regurgitation is not visualized. No evidence of pulmonic stenosis. Aorta: The aortic root is normal in size and structure. Venous: The inferior vena cava is dilated in size with less than 50% respiratory variability, suggesting right atrial pressure of 15 mmHg. IAS/Shunts: No atrial level shunt detected by color flow Doppler. Additional Comments: A device lead is  visualized.  LEFT VENTRICLE PLAX 2D LVIDd:         5.60 cm      Diastology LVIDs:         5.30 cm      LV e' medial:    4.24 cm/s LV PW:         0.90 cm      LV E/e' medial:  34.0 LV IVS:        1.00 cm      LV e' lateral:   8.49 cm/s LVOT diam:     2.00 cm      LV E/e' lateral: 17.0 LV SV:         38 LV SV Index:    22 LVOT Area:     3.14 cm  LV Volumes (MOD) LV vol d, MOD A2C: 141.0 ml LV vol d, MOD A4C: 115.0 ml LV vol s, MOD A2C: 97.1 ml LV vol s, MOD A4C: 110.0 ml LV SV MOD A2C:     43.9 ml LV SV MOD A4C:     115.0 ml LV SV MOD BP:      22.3 ml RIGHT VENTRICLE RV Basal diam:  3.90 cm RV Mid diam:    3.10 cm RV S prime:     9.36 cm/s TAPSE (M-mode): 1.9 cm LEFT ATRIUM              Index        RIGHT ATRIUM           Index LA diam:        5.30 cm  3.09 cm/m   RA Area:     19.60 cm LA Vol (A2C):   118.0 ml 68.71 ml/m  RA Volume:   50.80 ml  29.58 ml/m LA Vol (A4C):   109.0 ml 63.47 ml/m LA Biplane Vol: 114.0 ml 66.38 ml/m  AORTIC VALVE AV Area (Vmax):    2.33 cm AV Area (Vmean):   2.09 cm AV Area (VTI):     1.81 cm AV Vmax:           115.00 cm/s AV Vmean:          75.600 cm/s AV VTI:            0.212 m AV Peak Grad:      5.3 mmHg AV Mean Grad:      3.0 mmHg LVOT Vmax:         85.40 cm/s LVOT Vmean:        50.300 cm/s LVOT VTI:          0.122 m LVOT/AV VTI ratio: 0.58 MITRAL VALVE                  TRICUSPID VALVE MV Area (PHT): 5.02 cm       TR Peak grad:   57.5 mmHg MV Decel Time: 151 msec       TR Vmax:        379.00 cm/s MR Peak grad:    131.3 mmHg MR Mean grad:    86.0 mmHg    SHUNTS MR Vmax:         573.00 cm/s  Systemic VTI:  0.12 m MR Vmean:        444.0 cm/s   Systemic Diam: 2.00 cm MR PISA:         4.02 cm MR PISA Eff ROA: 16 mm MR PISA Radius:  0.80 cm MV E velocity: 144.00 cm/s MV A velocity: 50.80  cm/s MV E/A ratio:  2.83 Armanda Magic MD Electronically signed by Armanda Magic MD Signature Date/Time: 04/06/2023/3:50:45 PM    Final     Cardiac Studies     Patient Profile     TYLOR GAMBRILL is a 82 y.o. female with a hx of NICM secondary to frequent PVCs, CAD, HTN, HLD, DM II, PAD, CKD stage III, OSA, PAF and h/o ICD 01/24/2023 by Dr. Ladona Ridgel who is being seen 04/06/2023 for the evaluation of heart failure at the request of Dr. Chestine Spore.   Assessment & Plan    1.Acute on chronic HFrEF - Jan 2024  echo LVEF 20-25% - 03/2023 echo:LVEF  <20%, grade III dd, mod RV dysfunction, PASP 73, mod to severe MR probably ischemic/functional  - she has right IJ. COOX 73-->57-->pending. CVP today 14 - coox has not supported signifcant low output HF, f/u results today. CVP would argue against significant volume overload. Has not been diuresed due to significant AKI - appears euvolemic on exam, extremities are warm.    2.History of CAD - Underwent PCI of left circumflex artery in January 2024.  - she is on eliquis/plvaix in setting of afib with recent PCI   3. AKI on CKD - baseline Cr 1.5-1.8 - peak Cr 4.4, trending down today 3.19 - followed by neprhology  4. ALI - peak AST/ALT 2195/1643, trending down today 526/957 - INR 5.6-->2.1  5. PAF - on amio(more so for VT history), eliquis   6. History of prior VT arrest - has ICD for secondary prevention - has been on amiodarone.   7. UTI - per primary team  8. Metabolic acidosis - peak lactic acid >9, trending down to 2.1   For questions or updates, please contact [facility] Please consult [email or web address] for contact info under        Signed, Dina Rich, MD  04/08/2023, 9:26 AM

## 2023-04-08 NOTE — Progress Notes (Signed)
Pt refusing CHG wipes and states this is the reason she has a rash all over. Pt refused eucerin cream as well. Pt Right IJ dressing reinforced on previous shift. Pt stated that line was pulling while she was in chair. CN had untangled her lines but noted suture dangling lower than the day before. I changed dressing and notified MD of possible displacement. All attached lines were removed.  During dressing change I removed antimicrobial patch (with CHG), I was not able to clean with chg or replace patch. Area where patch was is raw circle of inflamed dermis. Patient did not want tape replaced. I used skin protectant and placed occlusive dressing without placing over damaged skin. Pt resting with call bell within reach.  Will continue to monitor. Thomas Hoff, RN

## 2023-04-08 NOTE — Progress Notes (Signed)
Pharmacy Antibiotic Note  Natasha Woodard is a 82 y.o. female admitted on 04/05/2023 with sepsis.  Pharmacy has been consulted for cefepime dosing.  WBC WNL, afebrile. Pt w/ AKI on CKD. Renal function is slowly improving. Will adjust dosing accordingly.  Plan: Cefepime 2g IV Q24H. F/u deescalation and duration of treatment - recommend 3-5 days  Height: 5\' 2"  (157.5 cm) Weight: 74.7 kg (164 lb 11.2 oz) IBW/kg (Calculated) : 50.1  Temp (24hrs), Avg:98 F (36.7 C), Min:97.3 F (36.3 C), Max:98.4 F (36.9 C)  Recent Labs  Lab 04/05/23 1820 04/05/23 1839 04/05/23 2050 04/05/23 2255 04/06/23 0058 04/06/23 0729 04/06/23 1355 04/06/23 1509 04/07/23 0331 04/07/23 1507 04/07/23 1600 04/08/23 0355  WBC 6.5  --   --   --  12.2*  --   --   --  8.7  --  8.3 6.7  CREATININE 4.29*   < >  --   --  4.22* 4.10*  --  4.08* 3.95* 3.76*  --  3.19*  LATICACIDVEN >9.0*  --  >9.0* >9.0* >9.0*  --  1.9  --  2.1*  --   --   --    < > = values in this interval not displayed.     Estimated Creatinine Clearance: 12.9 mL/min (A) (by C-G formula based on SCr of 3.19 mg/dL (H)).    Allergies  Allergen Reactions   Ezetimibe-Simvastatin Other (See Comments)    REACTION: Muscle aches (side effect)   Lipitor [Atorvastatin] Other (See Comments)    Leg weakness    Claritin [Loratadine]     Possible cause of nightmares.     Spironolactone     Caution re: elevated potassium/creatinine   Tamiflu [Oseltamivir Phosphate] Other (See Comments)    nightmares   Pravastatin Sodium Other (See Comments)    REACTION: Muscle aches (side effect)   Sulfonamide Derivatives Nausea And Vomiting    Thank you for involving pharmacy in this patient's care.  Enos Fling, PharmD PGY2 Pharmacy Resident 04/08/2023 7:59 AM

## 2023-04-09 DIAGNOSIS — I5023 Acute on chronic systolic (congestive) heart failure: Secondary | ICD-10-CM | POA: Diagnosis not present

## 2023-04-09 LAB — CBC
HCT: 36.1 % (ref 36.0–46.0)
Hemoglobin: 11 g/dL — ABNORMAL LOW (ref 12.0–15.0)
MCH: 29.2 pg (ref 26.0–34.0)
MCHC: 30.5 g/dL (ref 30.0–36.0)
MCV: 95.8 fL (ref 80.0–100.0)
Platelets: 159 10*3/uL (ref 150–400)
RBC: 3.77 MIL/uL — ABNORMAL LOW (ref 3.87–5.11)
RDW: 15.8 % — ABNORMAL HIGH (ref 11.5–15.5)
WBC: 4.5 10*3/uL (ref 4.0–10.5)
nRBC: 0 % (ref 0.0–0.2)

## 2023-04-09 LAB — BASIC METABOLIC PANEL
Anion gap: 10 (ref 5–15)
BUN: 53 mg/dL — ABNORMAL HIGH (ref 8–23)
CO2: 32 mmol/L (ref 22–32)
Calcium: 8.9 mg/dL (ref 8.9–10.3)
Chloride: 97 mmol/L — ABNORMAL LOW (ref 98–111)
Creatinine, Ser: 2.46 mg/dL — ABNORMAL HIGH (ref 0.44–1.00)
GFR, Estimated: 19 mL/min — ABNORMAL LOW (ref >60)
Glucose, Bld: 191 mg/dL — ABNORMAL HIGH (ref 70–99)
Potassium: 4 mmol/L (ref 3.5–5.1)
Sodium: 139 mmol/L (ref 135–145)

## 2023-04-09 LAB — MAGNESIUM: Magnesium: 1.9 mg/dL (ref 1.7–2.4)

## 2023-04-09 LAB — COOXEMETRY PANEL
Carboxyhemoglobin: 3.5 % — ABNORMAL HIGH (ref 0.5–1.5)
Methemoglobin: 0.8 % (ref 0.0–1.5)
O2 Saturation: 97.2 %
Total hemoglobin: 11.3 g/dL — ABNORMAL LOW (ref 12.0–16.0)

## 2023-04-09 LAB — PHOSPHORUS: Phosphorus: 2.7 mg/dL (ref 2.5–4.6)

## 2023-04-09 LAB — GLUCOSE, CAPILLARY
Glucose-Capillary: 154 mg/dL — ABNORMAL HIGH (ref 70–99)
Glucose-Capillary: 246 mg/dL — ABNORMAL HIGH (ref 70–99)
Glucose-Capillary: 159 mg/dL — ABNORMAL HIGH (ref 70–99)
Glucose-Capillary: 183 mg/dL — ABNORMAL HIGH (ref 70–99)

## 2023-04-09 MED ORDER — FUROSEMIDE 10 MG/ML IJ SOLN
60.0000 mg | Freq: Two times a day (BID) | INTRAMUSCULAR | Status: AC
  Administered 2023-04-09 (×2): 60 mg via INTRAVENOUS
  Filled 2023-04-09 (×2): qty 6

## 2023-04-09 NOTE — Progress Notes (Signed)
Rounding Note    Patient Name: Natasha Woodard Date of Encounter: 04/09/2023  [facility] Cardiologist: Bryan Lemma, MD   Subjective   No complaints  Inpatient Medications    Scheduled Meds:  amiodarone  200 mg Oral Daily   apixaban  2.5 mg Oral BID   clopidogrel  75 mg Oral Daily   furosemide  60 mg Intravenous BID   hydrocerin   Topical BID   insulin aspart  1-3 Units Subcutaneous TID AC & HS   Continuous Infusions:  ceFEPime (MAXIPIME) IV 1 g (04/09/23 0041)   PRN Meds: docusate sodium, ondansetron (ZOFRAN) IV, ondansetron, mouth rinse, polyethylene glycol, traMADol, white petrolatum   Vital Signs    Vitals:   04/08/23 1622 04/08/23 2000 04/08/23 2200 04/09/23 0641  BP: 115/66 (!) 118/56    Pulse: 71   71  Resp: 20 18  18   Temp: 98 F (36.7 C) 98.1 F (36.7 C)    TempSrc: Oral Oral    SpO2: 96%  96% 100%  Weight:    74.7 kg  Height:        Intake/Output Summary (Last 24 hours) at 04/09/2023 0904 Last data filed at 04/09/2023 0800 Gross per 24 hour  Intake --  Output 1860 ml  Net -1860 ml      04/09/2023    6:41 AM 04/08/2023    5:00 AM 04/06/2023   12:00 PM  Last 3 Weights  Weight (lbs) 164 lb 11.2 oz 164 lb 11.2 oz 170 lb 3.1 oz  Weight (kg) 74.707 kg 74.707 kg 77.2 kg      Telemetry    SR - Personally Reviewed  ECG    N/a - Personally Reviewed  Physical Exam   GEN: No acute distress.   Neck: +JVD Cardiac: RRR, no murmurs, rubs, or gallops.  Respiratory: faint crackles bilaterally GI: Soft, nontender, non-distended  MS: 1+ bilateral LE edema Neuro:  Nonfocal  Psych: Normal affect   Labs    High Sensitivity Troponin:   Recent Labs  Lab 03/31/23 0706 03/31/23 0930 04/05/23 1820 04/05/23 2050  TROPONINIHS 45* 40* 75* 76*     Chemistry Recent Labs  Lab 04/06/23 1509 04/07/23 0331 04/07/23 1507 04/08/23 0355 04/09/23 0522  NA 142 142 139 140 139  K 4.5 4.4 4.2 4.2 4.0  CL 99 101 98 101 97*  CO2 29 29 29  31  32  GLUCOSE 92 155* 135* 104* 191*  BUN 62* 67* 66* 58* 53*  CREATININE 4.08* 3.95* 3.76* 3.19* 2.46*  CALCIUM 9.2 8.8* 8.5* 8.8* 8.9  MG  --  2.3  --  2.2 1.9  PROT 6.2*  --  5.6* 5.8*  --   ALBUMIN 3.4*  --  2.7* 2.8*  --   AST 2,195*  --  823* 526*  --   ALT 1,643*  --  1,119* 957*  --   ALKPHOS 212*  --  229* 204*  --   BILITOT 1.8*  --  1.2 1.3*  --   GFRNONAA 10* 11* 11* 14* 19*  ANIONGAP 14 12 12 8 10     Lipids No results for input(s): "CHOL", "TRIG", "HDL", "LABVLDL", "LDLCALC", "CHOLHDL" in the last 168 hours.  Hematology Recent Labs  Lab 04/07/23 1600 04/08/23 0355 04/09/23 0522  WBC 8.3 6.7 4.5  RBC 3.63* 3.84* 3.77*  HGB 10.9* 11.2* 11.0*  HCT 34.0* 36.5 36.1  MCV 93.7 95.1 95.8  MCH 30.0 29.2 29.2  MCHC 32.1 30.7 30.5  RDW 15.7* 15.8* 15.8*  PLT 162 166  166 159   Thyroid No results for input(s): "TSH", "FREET4" in the last 168 hours.  BNP Recent Labs  Lab 04/05/23 1820 04/06/23 0058  BNP 2,550.1* 2,843.8*    DDimer  Recent Labs  Lab 04/07/23 1327 04/08/23 0355  DDIMER 7.98* 15.18*     Radiology    DG CHEST PORT 1 VIEW  Result Date: 04/08/2023 CLINICAL DATA:  Check central line placement EXAM: PORTABLE CHEST 1 VIEW COMPARISON:  04/07/2023 FINDINGS: Cardiac shadow is enlarged but stable. Defibrillator is again noted and stable. Right jugular central line is noted with catheter tip at the cavoatrial junction. Pneumothorax is seen. No focal infiltrate or effusion is noted. No bony abnormality is noted. IMPRESSION: No pneumothorax following central line placement. No other focal abnormality is noted. Electronically Signed   By: Alcide Clever M.D.   On: 04/08/2023 19:52    Cardiac Studies     Patient Profile     Natasha Woodard is a 82 y.o. female with a hx of NICM secondary to frequent PVCs, CAD, HTN, HLD, DM II, PAD, CKD stage III, OSA, PAF and h/o ICD 01/24/2023 by Dr. Ladona Ridgel who is being seen 04/06/2023 for the evaluation of heart failure at  the request of Dr. Chestine Spore.   Assessment & Plan    1.Acute on chronic HFrEF - Jan 2024 echo LVEF 20-25% - 03/2023 echo:LVEF  <20%, grade III dd, mod RV dysfunction, PASP 73, mod to severe MR probably ischemic/functional - admit Jan 2024 NSTEMI, cardiogenic shock at that time did require inotropes   - she has right IJ. COOX 73-->57-->61-->reportedly 97% this AM suspect error. CVP has been 12-14 yesterday does not have today. On exam today some LE edema, elevated JVD, mild crackles lungs. Update CVP, already started on IV lasix by primary team would agree with dosing.  - coox has not supported signifcant low output HF. Extremities are warm   - medical therapy limited by prior low output HF, prior issues with low bp's, prior groin infection avoding SGLT2i, current AKI    2.History of CAD - Underwent PCI of left circumflex artery in January 2024.  - she is on eliquis/plvaix in setting of afib with recent PCI    3. AKI on CKD - baseline Cr 1.5-1.8 - peak Cr 4.4, trending down today 2.46 - followed by neprhology   4. ALI - peak AST/ALT 2195/1643, trending down - INR 5.6-->2.1   5. PAF - on amio(more so for VT history), eliquis     6. History of prior VT arrest - has ICD for secondary prevention - has been on amiodarone.    7. UTI - per primary team   8. Metabolic acidosis - peak lactic acid >9, trending down to 2.1  9. PVCs/VT - on amio  For questions or updates, please contact [facility] Please consult [email or web address] for contact info under        Signed, Dina Rich, MD  04/09/2023, 9:04 AM

## 2023-04-09 NOTE — Progress Notes (Signed)
[facility] Progress Note  Name: BATYA CITRON MRN: [id number] DOB: 1941-07-27  Chief Complaint:  Nausea/vomiting  Subjective:  She had 1.0 liters uop over 4/27.  She got lasix 60 mg IV once yesterday and she was ordered for two doses of lasix today.  She feels ok.    Review of systems:   She denies shortness of breath or chest pain.   Denies n/v  Still has the foley catheter  ---------------- Background on consult:  FRANCESA EUGENIO is an 82 y.o. female with DM, CAD h/o MI 01/2023 (complicated course), HFpEF, PAD, OSA, A fib, CKD3 currently admitted with N/V, severe acidosis and AKI for which nephrology is consulted.  Prolonged hospitalization 01/11/23 - 02/19/23 MI, cardiac arrest, cardiogenic shock req IABP, s/p ICD discharged to CIR where she stayed 3/10-3/19/24.  Readmitted 4/19-22 with N/V/abd pain with unrevealing CT.  Found to have enterotoxigenic E. coli ; AKI Cr ~3 from recent baseline 1.3-1.6mg /dL with neg renal US.  Improved with fluids, withholding ARB and diuretics to 2.4 at discharge.  Returned to ED today with N/V - triage VS 122/64, O2 96% ra, afebrile.  Labs showing Na 139, K 6, Bicarb 11, BUN 57, Cr 4.3, AG 29, AST 804, ALT 723 (from normal 7/20), T bili 2.7 (normal 4/20), BNP 2550; VBG 7.06, 38, lactate 9.   CXR mild pulm edema vs infection (favor former).  Renal US normal.  Due to increased WOB placed on bipap.  Potassium managed medically with shifting and given 1 amp bicarb + bicarb gtt.  PCCM consulted for admission.  Pt currently awake and alert on bipap with family bedside.  N/V/abd pain with po intake but not with palpation x days -- dehydrated.  Was taking meds until today - inc torsemide 20 daily but doesn't appear to have been on MRB or ARB.  Decreased urine volume, normal character.   +LE edema recently.  No NSAID, no contrast.  Pt getting central line and going to ICU.     Intake/Output Summary (Last 24 hours) at 04/09/2023 1104 Last data filed at  04/09/2023 0800 Gross per 24 hour  Intake --  Output 1860 ml  Net -1860 ml    Vitals:  Vitals:   04/08/23 1622 04/08/23 2000 04/08/23 2200 04/09/23 0641  BP: 115/66 (!) 118/56    Pulse: 71   71  Resp: 20 18  18   Temp: 98 F (36.7 C) 98.1 F (36.7 C)    TempSrc: Oral Oral    SpO2: 96%  96% 100%  Weight:    74.7 kg  Height:         Physical Exam:   General elderly female in chair in no acute distress HEENT normocephalic atraumatic extraocular movements intact sclera anicteric Neck supple trachea midline Lungs crackles on auscultation bilaterally normal work of breathing at rest on room air  Heart S1S2 no rub Abdomen soft nontender nondistended Extremities 1-2+ pitting edema lower extremities with legs dependent in chair Psych normal mood and affect Neuro alert and oriented x 3 provides hx and follows commands; hard of hearing  GU foley in place - with pale yellow urine  Medications reviewed   Labs:     Latest Ref Rng & Units 04/09/2023    5:22 AM 04/08/2023    3:55 AM 04/07/2023    3:07 PM  BMP  Glucose 70 - 99 mg/dL 782  956  213   BUN 8 - 23 mg/dL 53  58  66  Creatinine 0.44 - 1.00 mg/dL 1.61  0.96  0.45   Sodium 135 - 145 mmol/L 139  140  139   Potassium 3.5 - 5.1 mmol/L 4.0  4.2  4.2   Chloride 98 - 111 mmol/L 97  101  98   CO2 22 - 32 mmol/L 32  31  29   Calcium 8.9 - 10.3 mg/dL 8.9  8.8  8.5      Assessment/Plan:    **AKI, severe on CKD 3:  - Secondary to pre-renal insults - n/v as well as ongoing diuretic.  Some ischemic insults as well and may have had some progression to ATN.  Note prior AKI event.  Consider atypical HUS however recent E coli was non shiga toxin producing; doubt this as etiology.  Renal US normal echogenicity and no hydro.  BL Cr 1.5-1.8 - She is improving with supportive care - Pharmacy is dosing cefepime.  Appreciate their assistance - Remove foley catheter and monitor for retention per nursing protocol.  See this order is in place  and agree   **severe anion gap metabolic acidosis:  - Improved  - s/p amps of bicarb and s/p bicarb gtt.    # Acute on chronic heart failure with reduced EF - Improving with diuretics - she was ordered for lasix 60 mg IV BID today - optimize volume status   - oxygen as needed - note that her home med list indicates she takes torsemide 20 mg daily.  She would benefit from a dose increase (try torsemide 40 mg daily once more euvolemic?)    # Possible Diverticulitis  - CT scan with diverticulosis and possible mild diverticulitis  - per primary team    **Hyperkalemia:   - resolved with medical measures  - transitioned from heart healthy to renal diet    ** CKD stage 3  - note baseline Cr 1.5 -1.8  **N/V/abd pain:  recent ETEC  - per primary team.  CT scan as above    **Transaminitis:  trend per primary - suspected to be congestive and may have ischemic insult contributing as well; improving    **CAD s/p recent NSTEMI, A fib: - per primary team   Disposition - Continue inpatient monitoring   Estanislado Emms, MD 04/09/2023 11:36 AM

## 2023-04-09 NOTE — Evaluation (Signed)
Occupational Therapy Evaluation Patient Details Name: Natasha Woodard MRN: [id number] DOB: 12-24-1940 Today's Date: 04/09/2023   History of Present Illness 82 y/o woman who presented with recurrent abdominal symptoms with early satiety, distention, diffuse pain throughout her abdomen, nausea, and vomiting. recent admission and was discharged on 04/03/23 with ETEC gastroenteritis. Admitted for severe metabolic acidosis, lactic acidosis, concern for decompensated heart failure PMH: NSTEMI and cardiogenic shock with a complicated and prolonged admission 1/30-3/10/24,  HFrEF, A-fib, AKI on CKD IVOSA, HTN, HLD   Clinical Impression   PTA, pt lived with husband and is independent in ADL and light IADL. Upion eval, pt presents with decreased activity tolerance, balance, and generalized weakness. Pt requiring min guard A for transfers and functional mobility into hall. Pt to benefit from continued OT services to optimize activity tolerance and address tub/shower transfers. Will continue to follow.      Recommendations for follow up therapy are one component of a multi-disciplinary discharge planning process, led by the attending physician.  Recommendations may be updated based on patient status, additional functional criteria and insurance authorization.   Assistance Recommended at Discharge Intermittent Supervision/Assistance  Patient can return home with the following A little help with walking and/or transfers;A little help with bathing/dressing/bathroom;Assistance with cooking/housework;Assist for transportation;Help with stairs or ramp for entrance    Functional Status Assessment  Patient has had a recent decline in their functional status and demonstrates the ability to make significant improvements in function in a reasonable and predictable amount of time.  Equipment Recommendations  BSC/3in1    Recommendations for Other Services       Precautions / Restrictions Precautions Precautions:  None Restrictions Weight Bearing Restrictions: No      Mobility Bed Mobility Overal bed mobility: Modified Independent             General bed mobility comments: HoB elevated, minimal use of bed rail to come to EoB    Transfers Overall transfer level: Needs assistance Equipment used: Rollator (4 wheels) Transfers: Sit to/from Stand, Bed to chair/wheelchair/BSC Sit to Stand: Min guard     Step pivot transfers: Min guard     General transfer comment: for safety      Balance Overall balance assessment: Needs assistance Sitting-balance support: No upper extremity supported, Feet supported Sitting balance-Leahy Scale: Fair     Standing balance support: Single extremity supported, Bilateral upper extremity supported, During functional activity, Reliant on assistive device for balance Standing balance-Leahy Scale: Poor Standing balance comment: require BUE support for balance                           ADL either performed or assessed with clinical judgement   ADL Overall ADL's : Needs assistance/impaired Eating/Feeding: Modified independent;Sitting   Grooming: Min guard;Standing   Upper Body Bathing: Set up;Sitting   Lower Body Bathing: Min guard;Sit to/from stand   Upper Body Dressing : Set up;Sitting   Lower Body Dressing: Min guard;Sit to/from stand   Toilet Transfer: Diplomatic Services operational officer (4 wheels);Ambulation;Regular Social worker and Hygiene: Sit to/from stand;Min guard       Functional mobility during ADLs: Min guard;Rolling walker (2 wheels)       Vision Baseline Vision/History: 1 Wears glasses Ability to See in Adequate Light: 0 Adequate Patient Visual Report: No change from baseline Vision Assessment?: No apparent visual deficits     Perception Perception Perception Tested?: No   Praxis Praxis Praxis tested?: Not tested  Pertinent Vitals/Pain Pain Assessment Pain Assessment: Faces Faces Pain  Scale: Hurts little more Pain Location: back Pain Descriptors / Indicators: Aching, Sore Pain Intervention(s): Limited activity within patient's tolerance, Monitored during session     Hand Dominance Right   Extremity/Trunk Assessment Upper Extremity Assessment Upper Extremity Assessment: Generalized weakness   Lower Extremity Assessment Lower Extremity Assessment: Defer to PT evaluation   Cervical / Trunk Assessment Cervical / Trunk Assessment: Kyphotic   Communication Communication Communication: No difficulties   Cognition Arousal/Alertness: Awake/alert Behavior During Therapy: WFL for tasks assessed/performed Overall Cognitive Status: Within Functional Limits for tasks assessed                                       General Comments  VSS on RA    Exercises     Shoulder Instructions      Home Living Family/patient expects to be discharged to:: Private residence Living Arrangements: Spouse/significant other Available Help at Discharge: Family;Available 24 hours/day Type of Home: House Home Access: Stairs to enter [employer] of Steps: 2 Entrance Stairs-Rails: Right;Left Home Layout: Two level;Bed/bath upstairs Alternate Level Stairs-Number of Steps: 14 Alternate Level Stairs-Rails: Left;Right Bathroom Shower/Tub: Chief Strategy Officer: Standard Bathroom Accessibility: Yes   Home Equipment: Cane - single point          Prior Functioning/Environment Prior Level of Function : Independent/Modified Independent;Driving             Mobility Comments: uses Rollator ADLs Comments: independent with ADLs, assist for iADLs        OT Problem List: Decreased strength;Decreased activity tolerance;Impaired balance (sitting and/or standing);Cardiopulmonary status limiting activity      OT Treatment/Interventions: Self-care/ADL training;Therapeutic exercise;DME and/or AE instruction;Patient/family education;Balance  training;Therapeutic activities    OT Goals(Current goals can be found in the care plan section) Acute Rehab OT Goals Patient Stated Goal: go home OT Goal Formulation: With patient Time For Goal Achievement: 04/23/23 Potential to Achieve Goals: Good  OT Frequency: Min 2X/week    Co-evaluation              AM-PAC OT "6 Clicks" Daily Activity     Outcome Measure Help from another person eating meals?: None Help from another person taking care of personal grooming?: A Little Help from another person toileting, which includes using toliet, bedpan, or urinal?: A Little Help from another person bathing (including washing, rinsing, drying)?: A Little Help from another person to put on and taking off regular upper body clothing?: A Little Help from another person to put on and taking off regular lower body clothing?: A Little 6 Click Score: 19   End of Session Equipment Utilized During Treatment: Gait belt;Rollator (4 wheels) Nurse Communication: Mobility status  Activity Tolerance: Patient tolerated treatment well Patient left: in bed;with call bell/phone within reach  OT Visit Diagnosis: Unsteadiness on feet (R26.81);Muscle weakness (generalized) (M62.81)                Time: 4098-1191 OT Time Calculation (min): 26 min Charges:  OT General Charges $OT Visit: 1 Visit OT Evaluation $OT Eval Low Complexity: 1 Low OT Treatments $Self Care/Home Management : 8-22 mins Tyler Deis, OTR/L [facility] Acute Rehabilitation Office: [phone]   Myrla Halsted 04/09/2023, 5:57 PM

## 2023-04-09 NOTE — Progress Notes (Signed)
PROGRESS NOTE    Natasha Woodard  WUJ:[id number] DOB: 1941/04/28 DOA: 04/05/2023 PCP: [name], MD  82/F with prior nonischemic cardiomyopathy, EF had recovered and then admitted with lateral wall MI in 2/24, cath with distal circumflex occlusion, complicated by severe ischemic MR, low output heart failure.  Required balloon pump, subsequently removed for limb ischemia concerns, developed right femoral pseudoaneurysm. Had VT arrest 2/8, ICD placed 2/12  -Treated with prolonged course of milrinone, diuretics etc. and had multiple surgeries of right groin, debridement, wound VAC placement and change etc. subsequently went to CIR and discharged home 3/19. - Readmitted 4/19-22 with N/V/abd pain with unrevealing CT.  Found to have enterotoxigenic E. coli ; AKI Cr ~3 from recent baseline 1.3-1.6mg /dL . Improved with fluids, withholding ARB and diuretics to 2.4 at discharge.   -Return to the ED 4/24 with N/V and dyspnea-labs noted, K 6, Bicarb 11, BUN 57, Cr 4.3, AG 29, AST 804, ALT 723, BNP 2550; VBG 7.06, 38, lactate 9.   CXR mild pulm edema, due to increased WOB placed on bipap.  She was placed on bicarb drip and admitted to the ICU. -Cards and nephrology following, concern for low output failure -Has not been diuresed on account of renal failure -Echo noted EF less than 20%, severely elevated PA systolic pressure, severe MR -Transferred to [facility] service 4/27 from ICU   Subjective: -Feels fair overall, breathing slowly improving  Assessment and Plan:  Acute on chronic systolic CHF -Echo with severe biventricular failure, EF less than 20%, moderately reduced RV, regional wall motion abnormalities, moderate to severe mitral regurgitation -I am concerned that she had either cardiogenic shock +- septic in ICU with resultant severe ATN, lactic and metabolic acidosis, shock liver -Cards following, coox is stable, CVP is elevated  -IV Lasix X2 doses   Moderate to severe mitral  regurgitation -TEE last month noted moderate MR, likely ischemic  History of CAD -Treated with PCI of left circumflex in 1/24 -Currently on Plavix and Eliquis in the setting of A-fib  AKI on CKD3b -Baseline creatinine 1.5-1.8, peaked at 4.4 this admission, now improving, down to 2.4 today -Suspect ATN in the setting of shock, prerenal etiology likely also contributing -Nephrology following, no hydronephrosis on imaging -Diuretic as noted above  Shock liver -Improving  History of recent VT arrest -ICD placed 2/12 -Continue amiodarone -Electrolytes are stable  Recent gastroenteritis, positive for enterotoxigenic E. Coli -CT with anasarca and bowel wall thickening, do not suspect diverticulitis -Currently on IV cefepime for UTI,  Enterobacter and Proteus UTI -Day 4/5 of cefepime, discontinue Foley catheter  -Monitor for retention   Paroxysmal atrial fibrillation -Currently in sinus rhythm, resume apixaban -Continue oral amiodarone  DVT prophylaxis: Eliquis Code Status: DNR Family Communication: None present Disposition Plan: To be determined  Consultants:    Procedures:   Antimicrobials:    Objective: Vitals:   04/08/23 2000 04/08/23 2200 04/09/23 0641 04/09/23 1119  BP: (!) 118/56   120/70  Pulse:   71 71  Resp: 18  18 14   Temp: 98.1 F (36.7 C)   98.4 F (36.9 C)  TempSrc: Oral   Oral  SpO2:  96% 100% 99%  Weight:   74.7 kg   Height:        Intake/Output Summary (Last 24 hours) at 04/09/2023 1132 Last data filed at 04/09/2023 0914 Gross per 24 hour  Intake 240 ml  Output 1860 ml  Net -1620 ml   Filed Weights   04/06/23 1200 04/08/23  0500 04/09/23 0641  Weight: 77.2 kg 74.7 kg 74.7 kg    Examination:  General exam: Chronically ill elderly female sitting up in bed, AAOx3 HEENT: Positive JVD, right IJ 8 central line CVS: S1-S2, regular rhythm, systolic murmur Lungs: Poor air movement bilaterally Abdomen: Soft, obese, nontender, bowel sounds  present Extremities: 1+ edema Skin: No rashes Psychiatry:  Mood & affect appropriate.     Data Reviewed:   CBC: Recent Labs  Lab 04/05/23 1820 04/05/23 1839 04/06/23 0058 04/06/23 0751 04/07/23 0331 04/07/23 1327 04/07/23 1600 04/08/23 0355 04/09/23 0522  WBC 6.5  --  12.2*  --  8.7  --  8.3 6.7 4.5  NEUTROABS 5.4  --   --   --   --   --   --   --   --   HGB 13.7   < > 11.9* 12.9 11.4*  --  10.9* 11.2* 11.0*  HCT 47.1*   < > 40.7 38.0 35.6*  --  34.0* 36.5 36.1  MCV 100.6*  --  101.2*  --  93.0  --  93.7 95.1 95.8  PLT 251  --  215  --  175 98* 162 166  166 159   < > = values in this interval not displayed.   Basic Metabolic Panel: Recent Labs  Lab 04/06/23 0058 04/06/23 0729 04/06/23 1509 04/07/23 0331 04/07/23 1507 04/08/23 0355 04/09/23 0522  NA 136   < > 142 142 139 140 139  K 6.0*   < > 4.5 4.4 4.2 4.2 4.0  CL 97*   < > 99 101 98 101 97*  CO2 11*   < > 29 29 29 31  32  GLUCOSE 260*   < > 92 155* 135* 104* 191*  BUN 62*   < > 62* 67* 66* 58* 53*  CREATININE 4.22*   < > 4.08* 3.95* 3.76* 3.19* 2.46*  CALCIUM 9.6   < > 9.2 8.8* 8.5* 8.8* 8.9  MG 2.4  --   --  2.3  --  2.2 1.9  PHOS 7.4*  --   --  4.8*  --  3.5 2.7   < > = values in this interval not displayed.   GFR: Estimated Creatinine Clearance: 16.7 mL/min (A) (by C-G formula based on SCr of 2.46 mg/dL (H)). Liver Function Tests: Recent Labs  Lab 04/05/23 1820 04/06/23 1509 04/07/23 1507 04/08/23 0355  AST 804* 2,195* 823* 526*  ALT 723* 1,643* 1,119* 957*  ALKPHOS 262* 212* 229* 204*  BILITOT 2.7* 1.8* 1.2 1.3*  PROT 7.2 6.2* 5.6* 5.8*  ALBUMIN 3.7 3.4* 2.7* 2.8*   Recent Labs  Lab 04/05/23 1820  LIPASE 28   No results for input(s): "AMMONIA" in the last 168 hours. Coagulation Profile: Recent Labs  Lab 04/07/23 1327 04/08/23 0355  INR 5.6* 2.1*   Cardiac Enzymes: No results for input(s): "CKTOTAL", "CKMB", "CKMBINDEX", "TROPONINI" in the last 168 hours. BNP (last 3  results) Recent Labs    06/09/22 1506  PROBNP 4,638*   HbA1C: No results for input(s): "HGBA1C" in the last 72 hours. CBG: Recent Labs  Lab 04/08/23 1135 04/08/23 1622 04/08/23 2051 04/09/23 0636 04/09/23 1118  GLUCAP 213* 179* 138* 154* 246*   Lipid Profile: No results for input(s): "CHOL", "HDL", "LDLCALC", "TRIG", "CHOLHDL", "LDLDIRECT" in the last 72 hours. Thyroid Function Tests: No results for input(s): "TSH", "T4TOTAL", "FREET4", "T3FREE", "THYROIDAB" in the last 72 hours. Anemia Panel: No results for input(s): "VITAMINB12", "FOLATE", "FERRITIN", "TIBC", "IRON", "RETICCTPCT"  in the last 72 hours. Urine analysis:    Component Value Date/Time   COLORURINE YELLOW 04/06/2023 1304   APPEARANCEUR CLEAR 04/06/2023 1304   LABSPEC 1.025 04/06/2023 1304   PHURINE 5.5 04/06/2023 1304   GLUCOSEU NEGATIVE 04/06/2023 1304   HGBUR LARGE (A) 04/06/2023 1304   BILIRUBINUR NEGATIVE 04/06/2023 1304   KETONESUR NEGATIVE 04/06/2023 1304   PROTEINUR 30 (A) 04/06/2023 1304   NITRITE NEGATIVE 04/06/2023 1304   LEUKOCYTESUR SMALL (A) 04/06/2023 1304   Sepsis Labs: @LABRCNTIP (procalcitonin:4,lacticidven:4)  ) Recent Results (from the past 240 hour(s))  Gastrointestinal Panel by PCR , Stool     Status: Abnormal   Collection Time: 03/31/23  7:56 AM   Specimen: Stool  Result Value Ref Range Status   Campylobacter species NOT DETECTED NOT DETECTED Final   Plesimonas shigelloides NOT DETECTED NOT DETECTED Final   Salmonella species NOT DETECTED NOT DETECTED Final   Yersinia enterocolitica NOT DETECTED NOT DETECTED Final   Vibrio species NOT DETECTED NOT DETECTED Final   Vibrio cholerae NOT DETECTED NOT DETECTED Final   Enteroaggregative E coli (EAEC) NOT DETECTED NOT DETECTED Final   Enteropathogenic E coli (EPEC) NOT DETECTED NOT DETECTED Final   Enterotoxigenic E coli (ETEC) DETECTED (A) NOT DETECTED Final    Comment: CRITICAL RESULT CALLED TO, READ BACK BY AND VERIFIED WITH: LIZ  [facility] 04/02/2023 AT 0044 SRR    Shiga like toxin producing E coli (STEC) NOT DETECTED NOT DETECTED Final   Shigella/Enteroinvasive E coli (EIEC) NOT DETECTED NOT DETECTED Final   Cryptosporidium NOT DETECTED NOT DETECTED Final   Cyclospora cayetanensis NOT DETECTED NOT DETECTED Final   Entamoeba histolytica NOT DETECTED NOT DETECTED Final   Giardia lamblia NOT DETECTED NOT DETECTED Final   Adenovirus F40/41 NOT DETECTED NOT DETECTED Final   Astrovirus NOT DETECTED NOT DETECTED Final   Norovirus GI/GII NOT DETECTED NOT DETECTED Final   Rotavirus A NOT DETECTED NOT DETECTED Final   Sapovirus (I, II, IV, and V) NOT DETECTED NOT DETECTED Final    Comment: Performed at [facility], [address] [place], [place] [address]  Urine Culture     Status: Abnormal   Collection Time: 03/31/23  7:06 PM   Specimen: Urine, Random  Result Value Ref Range Status   Specimen Description URINE, RANDOM  Final   Special Requests   Final    URINE, CATHETERIZED Performed at [facility], [address], [place], [place] [address]    Culture (A)  Final    >=100,000 COLONIES/mL ENTEROBACTER AEROGENES 10,000 COLONIES/mL PROTEUS MIRABILIS    Report Status 04/04/2023 FINAL  Final   Organism ID, Bacteria ENTEROBACTER AEROGENES (A)  Final   Organism ID, Bacteria PROTEUS MIRABILIS (A)  Final      Susceptibility   Enterobacter aerogenes - MIC*    CEFEPIME <=0.12 SENSITIVE Sensitive     CEFTRIAXONE <=0.25 SENSITIVE Sensitive     CIPROFLOXACIN <=0.25 SENSITIVE Sensitive     GENTAMICIN <=1 SENSITIVE Sensitive     IMIPENEM 1 SENSITIVE Sensitive     NITROFURANTOIN 128 RESISTANT Resistant     TRIMETH/SULFA <=20 SENSITIVE Sensitive     PIP/TAZO <=4 SENSITIVE Sensitive     * >=100,000 COLONIES/mL ENTEROBACTER AEROGENES   Proteus mirabilis - MIC*    AMPICILLIN <=2 SENSITIVE Sensitive     CEFAZOLIN <=4 SENSITIVE Sensitive     CEFEPIME <=0.12 SENSITIVE Sensitive     CEFTRIAXONE <=0.25 SENSITIVE  Sensitive     CIPROFLOXACIN <=0.25 SENSITIVE Sensitive  GENTAMICIN <=1 SENSITIVE Sensitive     IMIPENEM 0.5 SENSITIVE Sensitive     NITROFURANTOIN 128 RESISTANT Resistant     TRIMETH/SULFA <=20 SENSITIVE Sensitive     AMPICILLIN/SULBACTAM <=2 SENSITIVE Sensitive     PIP/TAZO <=4 SENSITIVE Sensitive     * 10,000 COLONIES/mL PROTEUS MIRABILIS  MRSA Next Gen by PCR, Nasal     Status: None   Collection Time: 04/06/23  3:12 PM   Specimen: Nasal Mucosa; Nasal Swab  Result Value Ref Range Status   MRSA by PCR Next Gen NOT DETECTED NOT DETECTED Final    Comment: (NOTE) The GeneXpert MRSA Assay (FDA approved for NASAL specimens only), is one component of a comprehensive MRSA colonization surveillance program. It is not intended to diagnose MRSA infection nor to guide or monitor treatment for MRSA infections. Test performance is not FDA approved in patients less than 63 years old. Performed at [facility], [address], [place], [place] [address]      Radiology Studies: DG CHEST PORT 1 VIEW  Result Date: 04/08/2023 CLINICAL DATA:  Check central line placement EXAM: PORTABLE CHEST 1 VIEW COMPARISON:  04/07/2023 FINDINGS: Cardiac shadow is enlarged but stable. Defibrillator is again noted and stable. Right jugular central line is noted with catheter tip at the cavoatrial junction. Pneumothorax is seen. No focal infiltrate or effusion is noted. No bony abnormality is noted. IMPRESSION: No pneumothorax following central line placement. No other focal abnormality is noted. Electronically Signed   By: Alcide Clever M.D.   On: 04/08/2023 19:52     Scheduled Meds:  amiodarone  200 mg Oral Daily   apixaban  2.5 mg Oral BID   clopidogrel  75 mg Oral Daily   furosemide  60 mg Intravenous BID   hydrocerin   Topical BID   insulin aspart  1-3 Units Subcutaneous TID AC & HS   Continuous Infusions:  ceFEPime (MAXIPIME) IV 1 g (04/09/23 0041)     LOS: 4 days    Time spent:     Zannie Cove, MD [facility]   04/09/2023, 11:32 AM

## 2023-04-10 DIAGNOSIS — I509 Heart failure, unspecified: Secondary | ICD-10-CM | POA: Diagnosis not present

## 2023-04-10 DIAGNOSIS — N179 Acute kidney failure, unspecified: Secondary | ICD-10-CM | POA: Diagnosis not present

## 2023-04-10 DIAGNOSIS — E7800 Pure hypercholesterolemia, unspecified: Secondary | ICD-10-CM

## 2023-04-10 DIAGNOSIS — I2510 Atherosclerotic heart disease of native coronary artery without angina pectoris: Secondary | ICD-10-CM | POA: Diagnosis not present

## 2023-04-10 DIAGNOSIS — I5023 Acute on chronic systolic (congestive) heart failure: Secondary | ICD-10-CM | POA: Diagnosis not present

## 2023-04-10 LAB — COMPREHENSIVE METABOLIC PANEL
ALT: 594 U/L — ABNORMAL HIGH (ref 0–44)
AST: 129 U/L — ABNORMAL HIGH (ref 15–41)
Albumin: 2.9 g/dL — ABNORMAL LOW (ref 3.5–5.0)
Alkaline Phosphatase: 219 U/L — ABNORMAL HIGH (ref 38–126)
Anion gap: 10 (ref 5–15)
BUN: 45 mg/dL — ABNORMAL HIGH (ref 8–23)
CO2: 33 mmol/L — ABNORMAL HIGH (ref 22–32)
Calcium: 9.1 mg/dL (ref 8.9–10.3)
Chloride: 97 mmol/L — ABNORMAL LOW (ref 98–111)
Creatinine, Ser: 1.98 mg/dL — ABNORMAL HIGH (ref 0.44–1.00)
GFR, Estimated: 25 mL/min — ABNORMAL LOW (ref >60)
Glucose, Bld: 140 mg/dL — ABNORMAL HIGH (ref 70–99)
Potassium: 3.8 mmol/L (ref 3.5–5.1)
Sodium: 140 mmol/L (ref 135–145)
Total Bilirubin: 1 mg/dL (ref 0.3–1.2)
Total Protein: 6.1 g/dL — ABNORMAL LOW (ref 6.5–8.1)

## 2023-04-10 LAB — GLUCOSE, CAPILLARY
Glucose-Capillary: 139 mg/dL — ABNORMAL HIGH (ref 70–99)
Glucose-Capillary: 136 mg/dL — ABNORMAL HIGH (ref 70–99)
Glucose-Capillary: 151 mg/dL — ABNORMAL HIGH (ref 70–99)
Glucose-Capillary: 125 mg/dL — ABNORMAL HIGH (ref 70–99)
Glucose-Capillary: 90 mg/dL (ref 70–99)

## 2023-04-10 LAB — CBC
HCT: 37.6 % (ref 36.0–46.0)
Hemoglobin: 11.8 g/dL — ABNORMAL LOW (ref 12.0–15.0)
MCH: 29.3 pg (ref 26.0–34.0)
MCHC: 31.4 g/dL (ref 30.0–36.0)
MCV: 93.3 fL (ref 80.0–100.0)
Platelets: 165 10*3/uL (ref 150–400)
RBC: 4.03 MIL/uL (ref 3.87–5.11)
RDW: 15.5 % (ref 11.5–15.5)
WBC: 4.1 10*3/uL (ref 4.0–10.5)
nRBC: 0 % (ref 0.0–0.2)

## 2023-04-10 MED ORDER — ADULT MULTIVITAMIN W/MINERALS CH: 1.0000 | ORAL_TABLET | Freq: Every day | ORAL | Status: DC

## 2023-04-10 MED ORDER — CHILDRENS CHEW MULTIVITAMIN PO CHEW
1.0000 | CHEWABLE_TABLET | Freq: Every day | ORAL | Status: DC
  Administered 2023-04-10 – 2023-04-14 (×5): 1 via ORAL
  Filled 2023-04-10 (×6): qty 1

## 2023-04-10 MED ORDER — ENSURE ENLIVE PO LIQD
237.0000 mL | Freq: Two times a day (BID) | ORAL | Status: DC
  Administered 2023-04-10 – 2023-04-13 (×5): 237 mL via ORAL

## 2023-04-10 MED ORDER — FUROSEMIDE 10 MG/ML IJ SOLN
40.0000 mg | Freq: Two times a day (BID) | INTRAMUSCULAR | Status: AC
  Administered 2023-04-10 (×2): 40 mg via INTRAVENOUS
  Filled 2023-04-10 (×2): qty 4

## 2023-04-10 NOTE — Progress Notes (Signed)
PROGRESS NOTE    Natasha Woodard  ZOX:[id number] DOB: 24-Jul-1941 DOA: 04/05/2023 PCP: [name], MD  82/F with prior nonischemic cardiomyopathy, EF had recovered and then admitted with lateral wall MI in 2/24, cath with distal circumflex occlusion, complicated by severe ischemic MR, low output heart failure.  Required balloon pump, subsequently removed for limb ischemia concerns, developed right femoral pseudoaneurysm. Had VT arrest 2/8, ICD placed 2/12  -Treated with prolonged course of milrinone, diuretics etc. and had multiple surgeries of right groin, debridement, wound VAC placement and change etc. subsequently went to CIR and discharged home 3/19. - Readmitted 4/19-22 with N/V/abd pain with unrevealing CT.  Found to have enterotoxigenic E. coli ; AKI Cr ~3 from recent baseline 1.3-1.6mg /dL . Improved with fluids, withholding ARB and diuretics to 2.4 at discharge.   -Return to the ED 4/24 with N/V and dyspnea-labs noted, K 6, Bicarb 11, BUN 57, Cr 4.3, AG 29, AST 804, ALT 723, BNP 2550; VBG 7.06, 38, lactate 9.   CXR mild pulm edema, due to increased WOB placed on bipap.  She was placed on bicarb drip and admitted to the ICU. -Cards and nephrology following, concern for low output failure -Has not been diuresed on account of renal failure -Echo noted EF less than 20%, severely elevated PA systolic pressure, severe MR -Transferred to [facility] service 4/27 from ICU   Subjective: -Feels better overall, mild nausea earlier, breathing improving, urinating without difficulty  Assessment and Plan:  Acute on chronic systolic CHF -Echo with severe biventricular failure, EF less than 20%, moderately reduced RV, regional wall motion abnormalities, moderate to severe mitral regurgitation -I am concerned that she had either cardiogenic shock +- septic in ICU with resultant severe ATN, lactic and metabolic acidosis, shock liver -Cards following, coox is stable, CVP is elevated  -Repeat IV Lasix  today, weight improving, BMP in a.m. -DC central line, increase activity  Moderate to severe mitral regurgitation -TEE last month noted moderate MR, likely ischemic  History of CAD -Treated with PCI of left circumflex in 1/24 -Currently on Plavix and Eliquis in the setting of A-fib  AKI on CKD3b -Baseline creatinine 1.5-1.8, peaked at 4.4 this admission, now improving, down to 1.9 today -Suspect ATN in the setting of shock, prerenal etiology likely also contributing -Nephrology following, no hydronephrosis on imaging -Diuretic as noted above  Shock liver -Improving  History of recent VT arrest -ICD placed 2/12 -Continue amiodarone -Electrolytes are stable  Recent gastroenteritis, positive for enterotoxigenic E. Coli -CT with anasarca and bowel wall thickening, do not suspect diverticulitis -Completed 5 days of cefepime for UTI  Enterobacter and Proteus UTI -Day 5 of cefepime, discontinue antibiotics after today's dose, discontinued Foley catheter yesterday -Monitor for retention   Paroxysmal atrial fibrillation -Currently in sinus rhythm, resume apixaban -Continue oral amiodarone  DVT prophylaxis: Eliquis Code Status: DNR Family Communication: None present Disposition Plan: To be determined  Consultants:    Procedures:   Antimicrobials:    Objective: Vitals:   04/09/23 2255 04/10/23 0600 04/10/23 0753 04/10/23 1113  BP:  117/72 127/79 119/72  Pulse: 70 70 76 75  Resp: 16 18 18 20   Temp:  98 F (36.7 C) 98.1 F (36.7 C) 97.8 F (36.6 C)  TempSrc:  Oral Oral Oral  SpO2: 95% 93% 96% 98%  Weight:      Height:        Intake/Output Summary (Last 24 hours) at 04/10/2023 1231 Last data filed at 04/10/2023 0756 Gross per 24 hour  Intake 480 ml  Output 1801 ml  Net -1321 ml   Filed Weights   04/06/23 1200 04/08/23 0500 04/09/23 0641  Weight: 77.2 kg 74.7 kg 74.7 kg    Examination:  General exam: Chronically ill elderly female sitting up in bed,  AAOx3 HEENT: Positive JVD, right IJ central line CVS: S1-S2, regular rhythm, systolic murmur Lungs: Distant breath sounds otherwise clear Abdomen: Soft, nontender, bowel sounds present Extremities: Trace edema  Skin: No rashes Psychiatry:  Mood & affect appropriate.     Data Reviewed:   CBC: Recent Labs  Lab 04/05/23 1820 04/05/23 1839 04/07/23 0331 04/07/23 1327 04/07/23 1600 04/08/23 0355 04/09/23 0522 04/10/23 0603  WBC 6.5   < > 8.7  --  8.3 6.7 4.5 4.1  NEUTROABS 5.4  --   --   --   --   --   --   --   HGB 13.7   < > 11.4*  --  10.9* 11.2* 11.0* 11.8*  HCT 47.1*   < > 35.6*  --  34.0* 36.5 36.1 37.6  MCV 100.6*   < > 93.0  --  93.7 95.1 95.8 93.3  PLT 251   < > 175 98* 162 166  166 159 165   < > = values in this interval not displayed.   Basic Metabolic Panel: Recent Labs  Lab 04/06/23 0058 04/06/23 0729 04/07/23 0331 04/07/23 1507 04/08/23 0355 04/09/23 0522 04/10/23 0603  NA 136   < > 142 139 140 139 140  K 6.0*   < > 4.4 4.2 4.2 4.0 3.8  CL 97*   < > 101 98 101 97* 97*  CO2 11*   < > 29 29 31  32 33*  GLUCOSE 260*   < > 155* 135* 104* 191* 140*  BUN 62*   < > 67* 66* 58* 53* 45*  CREATININE 4.22*   < > 3.95* 3.76* 3.19* 2.46* 1.98*  CALCIUM 9.6   < > 8.8* 8.5* 8.8* 8.9 9.1  MG 2.4  --  2.3  --  2.2 1.9  --   PHOS 7.4*  --  4.8*  --  3.5 2.7  --    < > = values in this interval not displayed.   GFR: Estimated Creatinine Clearance: 20.7 mL/min (A) (by C-G formula based on SCr of 1.98 mg/dL (H)). Liver Function Tests: Recent Labs  Lab 04/05/23 1820 04/06/23 1509 04/07/23 1507 04/08/23 0355 04/10/23 0603  AST 804* 2,195* 823* 526* 129*  ALT 723* 1,643* 1,119* 957* 594*  ALKPHOS 262* 212* 229* 204* 219*  BILITOT 2.7* 1.8* 1.2 1.3* 1.0  PROT 7.2 6.2* 5.6* 5.8* 6.1*  ALBUMIN 3.7 3.4* 2.7* 2.8* 2.9*   Recent Labs  Lab 04/05/23 1820  LIPASE 28   No results for input(s): "AMMONIA" in the last 168 hours. Coagulation Profile: Recent Labs  Lab  04/07/23 1327 04/08/23 0355  INR 5.6* 2.1*   Cardiac Enzymes: No results for input(s): "CKTOTAL", "CKMB", "CKMBINDEX", "TROPONINI" in the last 168 hours. BNP (last 3 results) Recent Labs    06/09/22 1506  PROBNP 4,638*   HbA1C: No results for input(s): "HGBA1C" in the last 72 hours. CBG: Recent Labs  Lab 04/09/23 1628 04/09/23 2111 04/10/23 0617 04/10/23 0750 04/10/23 1110  GLUCAP 159* 183* 139* 136* 151*   Lipid Profile: No results for input(s): "CHOL", "HDL", "LDLCALC", "TRIG", "CHOLHDL", "LDLDIRECT" in the last 72 hours. Thyroid Function Tests: No results for input(s): "TSH", "T4TOTAL", "FREET4", "T3FREE", "THYROIDAB" in the  last 72 hours. Anemia Panel: No results for input(s): "VITAMINB12", "FOLATE", "FERRITIN", "TIBC", "IRON", "RETICCTPCT" in the last 72 hours. Urine analysis:    Component Value Date/Time   COLORURINE YELLOW 04/06/2023 1304   APPEARANCEUR CLEAR 04/06/2023 1304   LABSPEC 1.025 04/06/2023 1304   PHURINE 5.5 04/06/2023 1304   GLUCOSEU NEGATIVE 04/06/2023 1304   HGBUR LARGE (A) 04/06/2023 1304   BILIRUBINUR NEGATIVE 04/06/2023 1304   KETONESUR NEGATIVE 04/06/2023 1304   PROTEINUR 30 (A) 04/06/2023 1304   NITRITE NEGATIVE 04/06/2023 1304   LEUKOCYTESUR SMALL (A) 04/06/2023 1304   Sepsis Labs: @LABRCNTIP (procalcitonin:4,lacticidven:4)  ) Recent Results (from the past 240 hour(s))  Urine Culture     Status: Abnormal   Collection Time: 03/31/23  7:06 PM   Specimen: Urine, Random  Result Value Ref Range Status   Specimen Description URINE, RANDOM  Final   Special Requests   Final    URINE, CATHETERIZED Performed at [facility], [address], [place], [place] [address]    Culture (A)  Final    >=100,000 COLONIES/mL ENTEROBACTER AEROGENES 10,000 COLONIES/mL PROTEUS MIRABILIS    Report Status 04/04/2023 FINAL  Final   Organism ID, Bacteria ENTEROBACTER AEROGENES (A)  Final   Organism ID, Bacteria PROTEUS MIRABILIS (A)  Final       Susceptibility   Enterobacter aerogenes - MIC*    CEFEPIME <=0.12 SENSITIVE Sensitive     CEFTRIAXONE <=0.25 SENSITIVE Sensitive     CIPROFLOXACIN <=0.25 SENSITIVE Sensitive     GENTAMICIN <=1 SENSITIVE Sensitive     IMIPENEM 1 SENSITIVE Sensitive     NITROFURANTOIN 128 RESISTANT Resistant     TRIMETH/SULFA <=20 SENSITIVE Sensitive     PIP/TAZO <=4 SENSITIVE Sensitive     * >=100,000 COLONIES/mL ENTEROBACTER AEROGENES   Proteus mirabilis - MIC*    AMPICILLIN <=2 SENSITIVE Sensitive     CEFAZOLIN <=4 SENSITIVE Sensitive     CEFEPIME <=0.12 SENSITIVE Sensitive     CEFTRIAXONE <=0.25 SENSITIVE Sensitive     CIPROFLOXACIN <=0.25 SENSITIVE Sensitive     GENTAMICIN <=1 SENSITIVE Sensitive     IMIPENEM 0.5 SENSITIVE Sensitive     NITROFURANTOIN 128 RESISTANT Resistant     TRIMETH/SULFA <=20 SENSITIVE Sensitive     AMPICILLIN/SULBACTAM <=2 SENSITIVE Sensitive     PIP/TAZO <=4 SENSITIVE Sensitive     * 10,000 COLONIES/mL PROTEUS MIRABILIS  MRSA Next Gen by PCR, Nasal     Status: None   Collection Time: 04/06/23  3:12 PM   Specimen: Nasal Mucosa; Nasal Swab  Result Value Ref Range Status   MRSA by PCR Next Gen NOT DETECTED NOT DETECTED Final    Comment: (NOTE) The GeneXpert MRSA Assay (FDA approved for NASAL specimens only), is one component of a comprehensive MRSA colonization surveillance program. It is not intended to diagnose MRSA infection nor to guide or monitor treatment for MRSA infections. Test performance is not FDA approved in patients less than 41 years old. Performed at [facility], [address], [place], [place] [address]      Radiology Studies: DG CHEST PORT 1 VIEW  Result Date: 04/08/2023 CLINICAL DATA:  Check central line placement EXAM: PORTABLE CHEST 1 VIEW COMPARISON:  04/07/2023 FINDINGS: Cardiac shadow is enlarged but stable. Defibrillator is again noted and stable. Right jugular central line is noted with catheter tip at the cavoatrial junction.  Pneumothorax is seen. No focal infiltrate or effusion is noted. No bony abnormality is noted. IMPRESSION: No pneumothorax following central line placement.  No other focal abnormality is noted. Electronically Signed   By: Alcide Clever M.D.   On: 04/08/2023 19:52     Scheduled Meds:  amiodarone  200 mg Oral Daily   apixaban  2.5 mg Oral BID   clopidogrel  75 mg Oral Daily   furosemide  40 mg Intravenous BID   hydrocerin   Topical BID   insulin aspart  1-3 Units Subcutaneous TID AC & HS   Continuous Infusions:     LOS: 5 days    Time spent:    Zannie Cove, MD [facility]   04/10/2023, 12:31 PM

## 2023-04-10 NOTE — Progress Notes (Signed)
Patient ID: Natasha Woodard, female   DOB: [birth year], 82 y.o.   MRN: [id number] S: Feels well, no complaints. O:BP 119/72 (BP Location: Right Arm)   Pulse 75   Temp 97.8 F (36.6 C) (Oral)   Resp 20   Ht 5\' 2"  (1.575 m)   Wt 74.7 kg   SpO2 98%   BMI 30.12 kg/m   Intake/Output Summary (Last 24 hours) at 04/10/2023 1442 Last data filed at 04/10/2023 1300 Gross per 24 hour  Intake 600 ml  Output 401 ml  Net 199 ml   Intake/Output: I/O last 3 completed shifts: In: 610 [P.O.:600; I.V.:10] Out: 3051 [Urine:3050; Stool:1]  Intake/Output this shift:  Total I/O In: 360 [P.O.:360] Out: -  Weight change:  Gen: NAD CVS: RRR Resp:CTA Abd: +BS, soft, NT/ND Ext: trace-1+ pretibial edema bilaterally  Recent Labs  Lab 04/05/23 1820 04/05/23 1839 04/06/23 0058 04/06/23 0729 04/06/23 0751 04/06/23 1509 04/07/23 0331 04/07/23 1507 04/08/23 0355 04/09/23 0522 04/10/23 0603  NA 139   < > 136 140 139 142 142 139 140 139 140  K 6.0*   < > 6.0* 4.8 5.0 4.5 4.4 4.2 4.2 4.0 3.8  CL 99   < > 97* 97*  --  99 101 98 101 97* 97*  CO2 11*  --  11* 25  --  29 29 29 31  32 33*  GLUCOSE 62*   < > 260* 148*  --  92 155* 135* 104* 191* 140*  BUN 57*   < > 62* 62*  --  62* 67* 66* 58* 53* 45*  CREATININE 4.29*   < > 4.22* 4.10*  --  4.08* 3.95* 3.76* 3.19* 2.46* 1.98*  ALBUMIN 3.7  --   --   --   --  3.4*  --  2.7* 2.8*  --  2.9*  CALCIUM 10.2  --  9.6 9.5  --  9.2 8.8* 8.5* 8.8* 8.9 9.1  PHOS  --   --  7.4*  --   --   --  4.8*  --  3.5 2.7  --   AST 804*  --   --   --   --  2,195*  --  823* 526*  --  129*  ALT 723*  --   --   --   --  1,643*  --  1,119* 957*  --  594*   < > = values in this interval not displayed.   Liver Function Tests: Recent Labs  Lab 04/07/23 1507 04/08/23 0355 04/10/23 0603  AST 823* 526* 129*  ALT 1,119* 957* 594*  ALKPHOS 229* 204* 219*  BILITOT 1.2 1.3* 1.0  PROT 5.6* 5.8* 6.1*  ALBUMIN 2.7* 2.8* 2.9*   Recent Labs  Lab 04/05/23 1820  LIPASE 28   No  results for input(s): "AMMONIA" in the last 168 hours. CBC: Recent Labs  Lab 04/05/23 1820 04/05/23 1839 04/07/23 0331 04/07/23 1327 04/07/23 1600 04/08/23 0355 04/09/23 0522 04/10/23 0603  WBC 6.5   < > 8.7  --  8.3 6.7 4.5 4.1  NEUTROABS 5.4  --   --   --   --   --   --   --   HGB 13.7   < > 11.4*  --  10.9* 11.2* 11.0* 11.8*  HCT 47.1*   < > 35.6*  --  34.0* 36.5 36.1 37.6  MCV 100.6*   < > 93.0  --  93.7 95.1 95.8 93.3  PLT 251   < >  175   < > 162 166  166 159 165   < > = values in this interval not displayed.   Cardiac Enzymes: No results for input(s): "CKTOTAL", "CKMB", "CKMBINDEX", "TROPONINI" in the last 168 hours. CBG: Recent Labs  Lab 04/09/23 1628 04/09/23 2111 04/10/23 0617 04/10/23 0750 04/10/23 1110  GLUCAP 159* 183* 139* 136* 151*    Iron Studies: No results for input(s): "IRON", "TIBC", "TRANSFERRIN", "FERRITIN" in the last 72 hours. Studies/Results: DG CHEST PORT 1 VIEW  Result Date: 04/08/2023 CLINICAL DATA:  Check central line placement EXAM: PORTABLE CHEST 1 VIEW COMPARISON:  04/07/2023 FINDINGS: Cardiac shadow is enlarged but stable. Defibrillator is again noted and stable. Right jugular central line is noted with catheter tip at the cavoatrial junction. Pneumothorax is seen. No focal infiltrate or effusion is noted. No bony abnormality is noted. IMPRESSION: No pneumothorax following central line placement. No other focal abnormality is noted. Electronically Signed   By: Alcide Clever M.D.   On: 04/08/2023 19:52    amiodarone  200 mg Oral Daily   apixaban  2.5 mg Oral BID   childrens multivitamin  1 tablet Oral Daily   clopidogrel  75 mg Oral Daily   feeding supplement  237 mL Oral BID BM   furosemide  40 mg Intravenous BID   hydrocerin   Topical BID   insulin aspart  1-3 Units Subcutaneous TID AC & HS    BMET    Component Value Date/Time   NA 140 04/10/2023 0603   NA 145 (H) 07/07/2022 1007   K 3.8 04/10/2023 0603   CL 97 (L) 04/10/2023 0603    CO2 33 (H) 04/10/2023 0603   GLUCOSE 140 (H) 04/10/2023 0603   BUN 45 (H) 04/10/2023 0603   BUN 24 07/07/2022 1007   CREATININE 1.98 (H) 04/10/2023 0603   CREATININE 1.81 (H) 03/24/2023 1556   CALCIUM 9.1 04/10/2023 0603   GFRNONAA 25 (L) 04/10/2023 0603   GFRAA 49 (L) 10/09/2020 0933   CBC    Component Value Date/Time   WBC 4.1 04/10/2023 0603   RBC 4.03 04/10/2023 0603   HGB 11.8 (L) 04/10/2023 0603   HGB 14.2 10/09/2020 0935   HCT 37.6 04/10/2023 0603   HCT 44.9 10/09/2020 0935   PLT 165 04/10/2023 0603   PLT 379 10/09/2020 0935   MCV 93.3 04/10/2023 0603   MCV 91 10/09/2020 0935   MCH 29.3 04/10/2023 0603   MCHC 31.4 04/10/2023 0603   RDW 15.5 04/10/2023 0603   RDW 13.4 10/09/2020 0935   LYMPHSABS 0.4 (L) 04/05/2023 1820   LYMPHSABS 1.1 10/09/2020 0935   MONOABS 0.8 04/05/2023 1820   EOSABS 0.0 04/05/2023 1820   EOSABS 0.2 10/09/2020 0935   BASOSABS 0.0 04/05/2023 1820   BASOSABS 0.1 10/09/2020 0935     Assessment/Plan:  AKI/CKD stage III - presumably due to ischemic ATN with N/V, volume depletion.  Renal US normal, no hydronephrosis.  Scr continues to improve close to baseline of 1.5-1.8.  Nothing further to add.  Will sign off please call with questions or concerns.  Continue to hold ARB therapy.  Can follow up with our office in 1 month if Scr does not return to baseline.   Acute on chronic systolic CHF - improved with diuretics.  Would recommend increasing home torsemide dose from 20 mg daily to 40 mg daily when closer to discharge. Diverticulitis - per primary svc Hyperkalemia - resolved.  AGMA - improved with bicarb and diuretics. HTN - stable  CAD s/p NSTEMI - stable Afib - per primary.   Irena Cords, MD [facility]

## 2023-04-10 NOTE — Care Management Important Message (Signed)
Important Message  Patient Details  Name: Natasha Woodard MRN: [id number] Date of Birth: [birth year]   Medicare Important Message Given:        Renie Ora 04/10/2023, 11:11 AM

## 2023-04-10 NOTE — Progress Notes (Signed)
Initial Nutrition Assessment  DOCUMENTATION CODES:   Not applicable  INTERVENTION:  Ensure Plus High Protein po BID, each supplement provides 350 kcal and 20 grams of protein MVI  Education on adequate nutrition  Encouraged po intake    NUTRITION DIAGNOSIS:   Inadequate oral intake related to poor appetite, nausea as evidenced by per patient/family report, mild fat depletion, mild muscle depletion.   GOAL:   Patient will meet greater than or equal to 90% of their needs   MONITOR:   PO intake, Labs, I & O's, Supplement acceptance, Weight trends, Skin  REASON FOR ASSESSMENT:   Malnutrition Screening Tool    ASSESSMENT:   82 y.o. female with PMHx including T2DM, CAD hx of MI 01/2023 (complicated course), HFpEF, PAD, OSA, a-fib, CKD 3, HTN, HLD who was admitted for N/V, severe acidosis and AKI  Recent prolonged hospitalization 01/11/23-02/19/23, cardiac arrest, cardiogenic shock req IABP, s/p ICD and d/c'd to CIR   Early satiety, distension, diffuse pain throughout her abdomen, N/V  Recent gastroenteritis  Labs:  Glu 140, BUN 45, Cr 1.98, alk phos 219, AST 129, ALT 594, hga1c 5.6 Meds: lasix, insulin Wt: 65.3-74.7 kg wt fluctuation x 1 month, unable to determine weight loss; last standing wt was 165# PO: 71% avg meal intake x last 6 documented meals  I/O's: -1.2 L   Patient reports no appetite for weeks No more vomiting, nausea being controlled  Regular BM's  No chewing/swallowing issues  Eating well but not eating the meat here because it does not taste good  UBW 162-- masked wt loss from edema   NUTRITION - FOCUSED PHYSICAL EXAM:  Flowsheet Row Most Recent Value  Orbital Region Mild depletion  Upper Arm Region Mild depletion  Thoracic and Lumbar Region No depletion  Buccal Region Unable to assess  Temple Region Mild depletion  Clavicle Bone Region No depletion  Clavicle and Acromion Bone Region No depletion  Scapular Bone Region Unable to assess  Dorsal  Hand Moderate depletion  Patellar Region Unable to assess  Anterior Thigh Region Unable to assess  Posterior Calf Region Unable to assess  Edema (RD Assessment) Moderate  Hair Unable to assess  Eyes Reviewed  Mouth Reviewed  Skin Reviewed  Nails Reviewed       Diet Order:   Diet Order             Diet renal/carb modified with fluid restriction Diet-HS Snack? Nothing; Fluid restriction: 1800 mL Fluid; Room service appropriate? Yes; Fluid consistency: Thin  Diet effective now                   EDUCATION NEEDS:   Education needs have been addressed  Skin:  Skin Assessment: Reviewed RN Assessment  Last BM:  4/29  Height:   Ht Readings from Last 1 Encounters:  04/05/23 5\' 2"  (1.575 m)    Weight:   Wt Readings from Last 1 Encounters:  04/09/23 74.7 kg    BMI:  Body mass index is 30.12 kg/m.  Estimated Nutritional Needs:   Kcal:  1500-1700 kcal  Protein:  90-115 g  Fluid:  > 1.5L    Leodis Rains, RDN, LDN  Clinical Nutrition

## 2023-04-10 NOTE — Progress Notes (Signed)
Removed right triple lumen IJ. Applied petroleum gauze and 4x4 with tegaderm. Pressure held 5 minutes and pt aware to stay in bed for 30 minutes. Pt will call in notices any bleeding and will hold pressure to dressing in case of sneezing or coughing during initial 30 minutes. Skin under dressing excoriated from previous antimicrobial patch and pulled sutures. Pt resting with call bell within reach.  Will continue to monitor. Thomas Hoff, RN

## 2023-04-10 NOTE — Progress Notes (Signed)
Physical Therapy Treatment Patient Details Name: Natasha Woodard MRN: [id number] DOB: Jul 24, 1941 Today's Date: 04/10/2023   History of Present Illness 82 y/o woman who presented with recurrent abdominal symptoms with early satiety, distention, diffuse pain throughout her abdomen, nausea, and vomiting. recent admission and was discharged on 04/03/23 with ETEC gastroenteritis. Admitted for severe metabolic acidosis, lactic acidosis, concern for decompensated heart failure PMH: NSTEMI and cardiogenic shock with a complicated and prolonged admission 1/30-3/10/24,  HFrEF, A-fib, AKI on CKD IVOSA, HTN, HLD    PT Comments    Pt wakes easily and is agreeable to therapy. Husband assists with getting pt hearing aides. Once in standing pt reports need to get to [facility] quickly. Verbal cues for slowing down. After large BM, pt reports onset of nausea and request seated rest break prior to ambulation in hallway. Pt is overall min guard for bed mobility, transfers and ambulation with her Rollator. D/c plans remain appropriate at this time. PT will continue to follow acutely.   Recommendations for follow up therapy are one component of a multi-disciplinary discharge planning process, led by the attending physician.  Recommendations may be updated based on patient status, additional functional criteria and insurance authorization.  Assistance Recommended at Discharge Frequent or constant Supervision/Assistance  Patient can return home with the following A little help with walking and/or transfers;A little help with bathing/dressing/bathroom;Assistance with cooking/housework;Assist for transportation;Help with stairs or ramp for entrance   Equipment Recommendations  None recommended by PT       Precautions / Restrictions Precautions Precautions: None Restrictions Weight Bearing Restrictions: No     Mobility  Bed Mobility Overal bed mobility: Modified Independent             General bed mobility  comments: HoB elevated, minimal use of bed rail to come to EoB    Transfers Overall transfer level: Needs assistance Equipment used: Rollator (4 wheels) Transfers: Sit to/from Stand, Bed to chair/wheelchair/BSC Sit to Stand: Min guard           General transfer comment: min guard for safety from bed and BSC    Ambulation/Gait Ambulation/Gait assistance: Min guard Gait Distance (Feet): 60 Feet Assistive device: Rollator (4 wheels) Gait Pattern/deviations: Step-through pattern, Decreased step length - right, Decreased step length - left, Shuffle Gait velocity: slowed Gait velocity interpretation: <1.31 ft/sec, indicative of household ambulator   General Gait Details: min guard for safety and line and lead mangement for ambulation in hallway       Balance Overall balance assessment: Needs assistance Sitting-balance support: No upper extremity supported, Feet supported Sitting balance-Leahy Scale: Fair     Standing balance support: Single extremity supported, Bilateral upper extremity supported, During functional activity, Reliant on assistive device for balance Standing balance-Leahy Scale: Poor Standing balance comment: require support for balance                            Cognition Arousal/Alertness: Awake/alert Behavior During Therapy: WFL for tasks assessed/performed Overall Cognitive Status: Within Functional Limits for tasks assessed                                             General Comments General comments (skin integrity, edema, etc.): VSS on RA      Pertinent Vitals/Pain Pain Assessment Pain Assessment: Faces Faces Pain Scale: Hurts a little bit Pain  Location: stomach, nausea Pain Descriptors / Indicators: Aching, Sore Pain Intervention(s): Limited activity within patient's tolerance, Monitored during session, Repositioned     PT Goals (current goals can now be found in the care plan section) Acute Rehab PT  Goals Patient Stated Goal: go home PT Goal Formulation: With patient Time For Goal Achievement: 04/22/23 Potential to Achieve Goals: Good Progress towards PT goals: Progressing toward goals    Frequency    Min 1X/week      PT Plan Current plan remains appropriate       AM-PAC PT "6 Clicks" Mobility   Outcome Measure  Help needed turning from your back to your side while in a flat bed without using bedrails?: None Help needed moving from lying on your back to sitting on the side of a flat bed without using bedrails?: None Help needed moving to and from a bed to a chair (including a wheelchair)?: A Little Help needed standing up from a chair using your arms (e.g., wheelchair or bedside chair)?: A Little Help needed to walk in hospital room?: A Little Help needed climbing 3-5 steps with a railing? : A Lot 6 Click Score: 19    End of Session   Activity Tolerance: Patient tolerated treatment well Patient left: in chair;with call bell/phone within reach Nurse Communication: Mobility status;Other (comment) (BM on [facility]) PT Visit Diagnosis: Muscle weakness (generalized) (M62.81)     Time: 7846-9629 PT Time Calculation (min) (ACUTE ONLY): 38 min  Charges:  $Gait Training: 8-22 mins $Therapeutic Activity: 23-37 mins                     Joel Mericle B. Beverely Risen PT, DPT Acute Rehabilitation Services Please use secure chat or  Call Office [phone]    [name] Fleet 04/10/2023, 10:12 AM

## 2023-04-10 NOTE — Progress Notes (Signed)
Mobility Specialist: Progress Note   04/10/23 1451  Mobility  Activity Ambulated with assistance in hallway  Level of Assistance Contact guard assist, steadying assist  Assistive Device Four wheel walker  Distance Ambulated (ft) 160 ft  Activity Response Tolerated well  Mobility Referral Yes  $Mobility charge 1 Mobility   Pre-Mobility: 71 HR, 99% SpO2 Post-Mobility: 71 HR, 124/76 (90) BP, 100% SpO2  Pt received in the bed and agreeable to mobility. Mod I with bed mobility and contact guard during ambulation. To BSC per request, void successful, then hallway ambulation. No c/o throughout. Pt to the chair at end of session with call bell and phone in reach.   Arien Benincasa Mobility Specialist Please contact via SecureChat or Rehab office at [phone]

## 2023-04-10 NOTE — Progress Notes (Signed)
Rounding Note    Patient Name: Natasha Woodard Date of Encounter: 04/10/2023  [facility] Cardiologist: Bryan Lemma, MD   Subjective   No chest pain and breathing is about the same - sitting on side of bed to walk with PT  Inpatient Medications    Scheduled Meds:  amiodarone  200 mg Oral Daily   apixaban  2.5 mg Oral BID   clopidogrel  75 mg Oral Daily   furosemide  40 mg Intravenous BID   hydrocerin   Topical BID   insulin aspart  1-3 Units Subcutaneous TID AC & HS   Continuous Infusions:  PRN Meds: docusate sodium, ondansetron (ZOFRAN) IV, ondansetron, mouth rinse, polyethylene glycol, traMADol, white petrolatum   Vital Signs    Vitals:   04/09/23 2015 04/09/23 2255 04/10/23 0600 04/10/23 0753  BP: 122/75  117/72 127/79  Pulse: 70 70 70 76  Resp: 15 16 18 18   Temp: 98.2 F (36.8 C)  98 F (36.7 C) 98.1 F (36.7 C)  TempSrc: Oral  Oral Oral  SpO2: 95% 95% 93% 96%  Weight:      Height:        Intake/Output Summary (Last 24 hours) at 04/10/2023 0918 Last data filed at 04/10/2023 0756 Gross per 24 hour  Intake 610 ml  Output 1801 ml  Net -1191 ml      04/09/2023    6:41 AM 04/08/2023    5:00 AM 04/06/2023   12:00 PM  Last 3 Weights  Weight (lbs) 164 lb 11.2 oz 164 lb 11.2 oz 170 lb 3.1 oz  Weight (kg) 74.707 kg 74.707 kg 77.2 kg      Telemetry    Atrially paced, som V pacing and PVCs - Personally Reviewed  ECG    No new - Personally Reviewed  Physical Exam   GEN: No acute distress.   Neck: No JVD sitting up Cardiac: RRR, no murmurs, rubs, or gallops.  Respiratory: rales in bases to auscultation bilaterally. GI: Soft, nontender, non-distended  MS: + edema in thighs and abd; No deformity. Neuro:  Nonfocal  Psych: Normal affect   Labs    High Sensitivity Troponin:   Recent Labs  Lab 03/31/23 0706 03/31/23 0930 04/05/23 1820 04/05/23 2050  TROPONINIHS 45* 40* 75* 76*     Chemistry Recent Labs  Lab 04/07/23 0331  04/07/23 1507 04/08/23 0355 04/09/23 0522 04/10/23 0603  NA 142 139 140 139 140  K 4.4 4.2 4.2 4.0 3.8  CL 101 98 101 97* 97*  CO2 29 29 31  32 33*  GLUCOSE 155* 135* 104* 191* 140*  BUN 67* 66* 58* 53* 45*  CREATININE 3.95* 3.76* 3.19* 2.46* 1.98*  CALCIUM 8.8* 8.5* 8.8* 8.9 9.1  MG 2.3  --  2.2 1.9  --   PROT  --  5.6* 5.8*  --  6.1*  ALBUMIN  --  2.7* 2.8*  --  2.9*  AST  --  823* 526*  --  129*  ALT  --  1,119* 957*  --  594*  ALKPHOS  --  229* 204*  --  219*  BILITOT  --  1.2 1.3*  --  1.0  GFRNONAA 11* 11* 14* 19* 25*  ANIONGAP 12 12 8 10 10     Lipids No results for input(s): "CHOL", "TRIG", "HDL", "LABVLDL", "LDLCALC", "CHOLHDL" in the last 168 hours.  Hematology Recent Labs  Lab 04/08/23 0355 04/09/23 0522 04/10/23 0603  WBC 6.7 4.5 4.1  RBC 3.84* 3.77*  4.03  HGB 11.2* 11.0* 11.8*  HCT 36.5 36.1 37.6  MCV 95.1 95.8 93.3  MCH 29.2 29.2 29.3  MCHC 30.7 30.5 31.4  RDW 15.8* 15.8* 15.5  PLT 166  166 159 165   Thyroid No results for input(s): "TSH", "FREET4" in the last 168 hours.  BNP Recent Labs  Lab 04/05/23 1820 04/06/23 0058  BNP 2,550.1* 2,843.8*    DDimer  Recent Labs  Lab 04/07/23 1327 04/08/23 0355  DDIMER 7.98* 15.18*     Radiology    DG CHEST PORT 1 VIEW  Result Date: 04/08/2023 CLINICAL DATA:  Check central line placement EXAM: PORTABLE CHEST 1 VIEW COMPARISON:  04/07/2023 FINDINGS: Cardiac shadow is enlarged but stable. Defibrillator is again noted and stable. Right jugular central line is noted with catheter tip at the cavoatrial junction. Pneumothorax is seen. No focal infiltrate or effusion is noted. No bony abnormality is noted. IMPRESSION: No pneumothorax following central line placement. No other focal abnormality is noted. Electronically Signed   By: Alcide Clever M.D.   On: 04/08/2023 19:52    Cardiac Studies   ECHO 04/06/23  IMPRESSIONS    1. Left ventricular ejection fraction, by estimation, is <20%. The left  ventricle has  severely decreased function. The left ventricle demonstrate  severe global hypokinesis and regional wall motion abnormalities (see  scoring diagram/findings for  description). The left ventricular internal cavity size was mildly  dilated. Left ventricular diastolic parameters are consistent with Grade  III diastolic dysfunction (restrictive). Elevated left ventricular  end-diastolic pressure. There is akinesis of  the left ventricular, mid-apical lateral wall and inferolateral wall.  There is akinesis of the left ventricular, apical segment.   2. Right ventricular systolic function is moderately reduced. The right  ventricular size is normal. There is severely elevated pulmonary artery  systolic pressure. The estimated right ventricular systolic pressure is  72.5 mmHg.   3. Left atrial size was severely dilated.   4. Right atrial size was mildly dilated.   5. Based on eccentricity of MR and akinesis of the inferolateral and  lateral walls, suspect this is ischemic MR due to restricted posterior  mitral valve leaflet.. The mitral valve is abnormal. Moderate to severe  mitral valve regurgitation. No evidence  of mitral stenosis.   6. The aortic valve is tricuspid. Aortic valve regurgitation is trivial.  Aortic valve sclerosis/calcification is present, without any evidence of  aortic stenosis. Aortic valve area, by VTI measures 1.81 cm. Aortic valve  mean gradient measures 3.0 mmHg.   Aortic valve Vmax measures 1.15 m/s.   7. The inferior vena cava is dilated in size with <50% respiratory  variability, suggesting right atrial pressure of 15 mmHg.   FINDINGS   Left Ventricle: Left ventricular ejection fraction, by estimation, is  <20%. The left ventricle has severely decreased function. The left  ventricle demonstrates regional wall motion abnormalities. The left  ventricular internal cavity size was mildly  dilated. There is no left ventricular hypertrophy. Left ventricular  diastolic  parameters are consistent with Grade III diastolic dysfunction  (restrictive). Elevated left ventricular end-diastolic pressure.   Right Ventricle: The right ventricular size is normal. No increase in  right ventricular wall thickness. Right ventricular systolic function is  moderately reduced. There is severely elevated pulmonary artery systolic  pressure. The tricuspid regurgitant  velocity is 3.79 m/s, and with an assumed right atrial pressure of 15  mmHg, the estimated right ventricular systolic pressure is 72.5 mmHg.   Left Atrium: Left  atrial size was severely dilated.   Right Atrium: Right atrial size was mildly dilated.   Pericardium: There is no evidence of pericardial effusion.   Mitral Valve: Based on eccentricity of MR and akinesis of the  inferolateral and lateral walls, suspect this is ischemic MR due to  restricted posterior mitral valve leaflet. The mitral valve is abnormal.  Moderate to severe mitral valve regurgitation, with   eccentric posteriorly directed jet. No evidence of mitral valve stenosis.   Tricuspid Valve: The tricuspid valve is normal in structure. Tricuspid  valve regurgitation is mild . No evidence of tricuspid stenosis.   Aortic Valve: The aortic valve is tricuspid. Aortic valve regurgitation is  trivial. Aortic valve sclerosis/calcification is present, without any  evidence of aortic stenosis. Aortic valve mean gradient measures 3.0 mmHg.  Aortic valve peak gradient  measures 5.3 mmHg. Aortic valve area, by VTI measures 1.81 cm.   Pulmonic Valve: The pulmonic valve was normal in structure. Pulmonic valve  regurgitation is not visualized. No evidence of pulmonic stenosis.   Aorta: The aortic root is normal in size and structure.   Venous: The inferior vena cava is dilated in size with less than 50%  respiratory variability, suggesting right atrial pressure of 15 mmHg.   IAS/Shunts: No atrial level shunt detected by color flow Doppler.    Additional Comments: A device lead is visualized.       Patient Profile     82 y.o. female hx of NICM secondary to frequent PVCs, CAD, HTN, HLD, DM II, PAD, CKD stage III, OSA, PAF and h/o ICD 01/24/2023 by Dr. Ladona Ridgel now admitted 04/05/23 with severe metabolic acidosis and acute HFrEF.  Assessment & Plan    Acute on chronic HFrEF/CM --- Jan 2024 echo LVEF 20-25% no improvement in EF. - 03/2023 echo:LVEF  <20%, grade III dd, mod RV dysfunction, PASP 73, mod to severe MR probably ischemic/functional - admit Jan 2024 NSTEMI, cardiogenic shock at that time did require inotropes  - she has right IJ. COOX 73-->57-->61-->reportedly 97% this AM suspect error. CVP has been 12-14 yesterday does not have today. On exam today some LE edema, elevated JVD, mild crackles lungs. Update CVP, already started on IV lasix by primary team would agree with dosing.  - coox has not supported signifcant low output HF. Extremities are warm  - medical therapy limited by prior low output HF, prior issues with low bp's, prior groin infection avoding SGLT2i, current AKI --neg 2L yesterday  total since admit thought only neg 1280 since admit Wt down 3 kg since admit.  --on lasix 40 BID IV continue   (at home was on torsemide 20 mg daily) --Cr improved from 2.46 to 1.98 (had been on lasix 60 BID IV)  CAD/STEMI 12/2022  --hx of PCI LCX 12/2022 on plavix and Eliquis   AKI on CKD  --improved pk of 4.4 now 1.98 with decreased lasix dose  PAF/VT(hx of prior VT arrest) --on amio and eliquis will check TSH --atrially pacing --ICD for secondary prevention  UTI/Metabolic acidosis/ALI  --pk AST/ALT 2195/1643 trending down  INR  was up to 5.6, down to 2.1  on the 27th.  --per primary team         For questions or updates, please contact [facility] Please consult [email or web address] for contact info under        Signed, Nada Boozer, NP  04/10/2023, 9:18 AM

## 2023-04-11 ENCOUNTER — Inpatient Hospital Stay: Payer: Medicare Other | Admitting: Family Medicine

## 2023-04-11 DIAGNOSIS — N179 Acute kidney failure, unspecified: Secondary | ICD-10-CM | POA: Diagnosis not present

## 2023-04-11 DIAGNOSIS — I5023 Acute on chronic systolic (congestive) heart failure: Secondary | ICD-10-CM | POA: Diagnosis not present

## 2023-04-11 DIAGNOSIS — E7800 Pure hypercholesterolemia, unspecified: Secondary | ICD-10-CM | POA: Diagnosis not present

## 2023-04-11 DIAGNOSIS — R7401 Elevation of levels of liver transaminase levels: Secondary | ICD-10-CM

## 2023-04-11 DIAGNOSIS — I255 Ischemic cardiomyopathy: Secondary | ICD-10-CM

## 2023-04-11 DIAGNOSIS — I509 Heart failure, unspecified: Secondary | ICD-10-CM | POA: Diagnosis not present

## 2023-04-11 DIAGNOSIS — I2510 Atherosclerotic heart disease of native coronary artery without angina pectoris: Secondary | ICD-10-CM | POA: Diagnosis not present

## 2023-04-11 LAB — BASIC METABOLIC PANEL
Anion gap: 8 (ref 5–15)
BUN: 42 mg/dL — ABNORMAL HIGH (ref 8–23)
CO2: 32 mmol/L (ref 22–32)
Calcium: 8.9 mg/dL (ref 8.9–10.3)
Chloride: 97 mmol/L — ABNORMAL LOW (ref 98–111)
Creatinine, Ser: 1.85 mg/dL — ABNORMAL HIGH (ref 0.44–1.00)
GFR, Estimated: 27 mL/min — ABNORMAL LOW (ref >60)
Glucose, Bld: 187 mg/dL — ABNORMAL HIGH (ref 70–99)
Potassium: 3.3 mmol/L — ABNORMAL LOW (ref 3.5–5.1)
Sodium: 137 mmol/L (ref 135–145)

## 2023-04-11 LAB — CBC
HCT: 36.5 % (ref 36.0–46.0)
Hemoglobin: 11.6 g/dL — ABNORMAL LOW (ref 12.0–15.0)
MCH: 29.1 pg (ref 26.0–34.0)
MCHC: 31.8 g/dL (ref 30.0–36.0)
MCV: 91.7 fL (ref 80.0–100.0)
Platelets: 163 10*3/uL (ref 150–400)
RBC: 3.98 MIL/uL (ref 3.87–5.11)
RDW: 15.5 % (ref 11.5–15.5)
WBC: 3.7 10*3/uL — ABNORMAL LOW (ref 4.0–10.5)
nRBC: 0 % (ref 0.0–0.2)

## 2023-04-11 LAB — TSH: TSH: 4.765 u[IU]/mL — ABNORMAL HIGH (ref 0.350–4.500)

## 2023-04-11 LAB — GLUCOSE, CAPILLARY
Glucose-Capillary: 138 mg/dL — ABNORMAL HIGH (ref 70–99)
Glucose-Capillary: 280 mg/dL — ABNORMAL HIGH (ref 70–99)
Glucose-Capillary: 141 mg/dL — ABNORMAL HIGH (ref 70–99)

## 2023-04-11 MED ORDER — ALBUMIN HUMAN 25 % IV SOLN
25.0000 g | Freq: Once | INTRAVENOUS | Status: AC
  Administered 2023-04-11: 25 g via INTRAVENOUS
  Filled 2023-04-11: qty 100

## 2023-04-11 MED ORDER — FUROSEMIDE 10 MG/ML IJ SOLN
40.0000 mg | Freq: Once | INTRAMUSCULAR | Status: AC
  Administered 2023-04-11: 40 mg via INTRAVENOUS
  Filled 2023-04-11: qty 4

## 2023-04-11 MED ORDER — INSULIN ASPART 100 UNIT/ML IJ SOLN
0.0000 [IU] | Freq: Three times a day (TID) | INTRAMUSCULAR | Status: DC
  Administered 2023-04-11 – 2023-04-12 (×2): 1 [IU] via SUBCUTANEOUS
  Administered 2023-04-12 (×2): 2 [IU] via SUBCUTANEOUS
  Administered 2023-04-13: 3 [IU] via SUBCUTANEOUS
  Administered 2023-04-14: 2 [IU] via SUBCUTANEOUS
  Administered 2023-04-15: 3 [IU] via SUBCUTANEOUS
  Administered 2023-04-15: 2 [IU] via SUBCUTANEOUS

## 2023-04-11 MED ORDER — LOPERAMIDE HCL 2 MG PO CAPS
2.0000 mg | ORAL_CAPSULE | Freq: Once | ORAL | Status: AC
  Administered 2023-04-11: 2 mg via ORAL
  Filled 2023-04-11: qty 1

## 2023-04-11 MED ORDER — ACETAMINOPHEN 325 MG PO TABS
650.0000 mg | ORAL_TABLET | Freq: Four times a day (QID) | ORAL | Status: DC | PRN
  Administered 2023-04-11 – 2023-04-16 (×6): 650 mg via ORAL
  Filled 2023-04-11 (×7): qty 2

## 2023-04-11 MED ORDER — POTASSIUM CHLORIDE CRYS ER 20 MEQ PO TBCR
40.0000 meq | EXTENDED_RELEASE_TABLET | Freq: Once | ORAL | Status: AC
  Administered 2023-04-11: 40 meq via ORAL
  Filled 2023-04-11: qty 2

## 2023-04-11 NOTE — Progress Notes (Signed)
RT note. Patient not requiring bipap at this time. Will continue to monitor and place on patient later if needed.   04/11/23 0911  Therapy Vitals  Pulse Rate 71  Resp (!) 24  MEWS Score/Color  MEWS Score 1  MEWS Score Color Green  Oxygen Therapy/Pulse Ox  O2 Device Room Air  SpO2 93 %

## 2023-04-11 NOTE — Progress Notes (Signed)
Rounding Note    Patient Name: Natasha Woodard Date of Encounter: 04/11/2023  [facility] Cardiologist: Bryan Lemma, MD   Subjective   Still SOB but improved from admission.  Still has LE edema.  Inpatient Medications    Scheduled Meds:  amiodarone  200 mg Oral Daily   apixaban  2.5 mg Oral BID   childrens multivitamin  1 tablet Oral Daily   clopidogrel  75 mg Oral Daily   feeding supplement  237 mL Oral BID BM   hydrocerin   Topical BID   insulin aspart  1-3 Units Subcutaneous TID AC & HS   potassium chloride  40 mEq Oral Once   Continuous Infusions:  PRN Meds: docusate sodium, ondansetron (ZOFRAN) IV, ondansetron, mouth rinse, polyethylene glycol, traMADol, white petrolatum   Vital Signs    Vitals:   04/11/23 0013 04/11/23 0241 04/11/23 0728 04/11/23 0911  BP: 119/62 122/79 120/72   Pulse: 70 70 70 71  Resp: 14 13 19  (!) 24  Temp: 97.8 F (36.6 C) 97.9 F (36.6 C) (!) 97.3 F (36.3 C)   TempSrc: Oral Oral Oral   SpO2: 99% 100% 95% 93%  Weight:      Height:        Intake/Output Summary (Last 24 hours) at 04/11/2023 0930 Last data filed at 04/10/2023 1800 Gross per 24 hour  Intake 720 ml  Output 201 ml  Net 519 ml       04/09/2023    6:41 AM 04/08/2023    5:00 AM 04/06/2023   12:00 PM  Last 3 Weights  Weight (lbs) 164 lb 11.2 oz 164 lb 11.2 oz 170 lb 3.1 oz  Weight (kg) 74.707 kg 74.707 kg 77.2 kg      Telemetry    Atrial paced rhythm- Personally Reviewed  ECG    No new - Personally Reviewed  Physical Exam   GEN: Well nourished, well developed in no acute distress HEENT: Normal NECK: No JVD; No carotid bruits LYMPHATICS: No lymphadenopathy CARDIAC:RRR, no murmurs, rubs, gallops RESPIRATORY:  Clear to auscultation without rales, wheezing or rhonchi  ABDOMEN: Soft, non-tender, non-distended MUSCULOSKELETAL:  1-2+ LE edema; No deformity  SKIN: Warm and dry NEUROLOGIC:  Alert and oriented x 3 PSYCHIATRIC:  Normal affect   Labs    High Sensitivity Troponin:   Recent Labs  Lab 03/31/23 0706 03/31/23 0930 04/05/23 1820 04/05/23 2050  TROPONINIHS 45* 40* 75* 76*      Chemistry Recent Labs  Lab 04/07/23 0331 04/07/23 1507 04/08/23 0355 04/09/23 0522 04/10/23 0603 04/11/23 0127  NA 142 139 140 139 140 137  K 4.4 4.2 4.2 4.0 3.8 3.3*  CL 101 98 101 97* 97* 97*  CO2 29 29 31  32 33* 32  GLUCOSE 155* 135* 104* 191* 140* 187*  BUN 67* 66* 58* 53* 45* 42*  CREATININE 3.95* 3.76* 3.19* 2.46* 1.98* 1.85*  CALCIUM 8.8* 8.5* 8.8* 8.9 9.1 8.9  MG 2.3  --  2.2 1.9  --   --   PROT  --  5.6* 5.8*  --  6.1*  --   ALBUMIN  --  2.7* 2.8*  --  2.9*  --   AST  --  823* 526*  --  129*  --   ALT  --  1,119* 957*  --  594*  --   ALKPHOS  --  229* 204*  --  219*  --   BILITOT  --  1.2 1.3*  --  1.0  --  GFRNONAA 11* 11* 14* 19* 25* 27*  ANIONGAP 12 12 8 10 10 8      Lipids No results for input(s): "CHOL", "TRIG", "HDL", "LABVLDL", "LDLCALC", "CHOLHDL" in the last 168 hours.  Hematology Recent Labs  Lab 04/09/23 0522 04/10/23 0603 04/11/23 0127  WBC 4.5 4.1 3.7*  RBC 3.77* 4.03 3.98  HGB 11.0* 11.8* 11.6*  HCT 36.1 37.6 36.5  MCV 95.8 93.3 91.7  MCH 29.2 29.3 29.1  MCHC 30.5 31.4 31.8  RDW 15.8* 15.5 15.5  PLT 159 165 163    Thyroid  Recent Labs  Lab 04/11/23 0127  TSH 4.765*    BNP Recent Labs  Lab 04/05/23 1820 04/06/23 0058  BNP 2,550.1* 2,843.8*     DDimer  Recent Labs  Lab 04/07/23 1327 04/08/23 0355  DDIMER 7.98* 15.18*      Radiology    No results found.  Cardiac Studies   ECHO 04/06/23  IMPRESSIONS    1. Left ventricular ejection fraction, by estimation, is <20%. The left  ventricle has severely decreased function. The left ventricle demonstrate  severe global hypokinesis and regional wall motion abnormalities (see  scoring diagram/findings for  description). The left ventricular internal cavity size was mildly  dilated. Left ventricular diastolic parameters  are consistent with Grade  III diastolic dysfunction (restrictive). Elevated left ventricular  end-diastolic pressure. There is akinesis of  the left ventricular, mid-apical lateral wall and inferolateral wall.  There is akinesis of the left ventricular, apical segment.   2. Right ventricular systolic function is moderately reduced. The right  ventricular size is normal. There is severely elevated pulmonary artery  systolic pressure. The estimated right ventricular systolic pressure is  72.5 mmHg.   3. Left atrial size was severely dilated.   4. Right atrial size was mildly dilated.   5. Based on eccentricity of MR and akinesis of the inferolateral and  lateral walls, suspect this is ischemic MR due to restricted posterior  mitral valve leaflet.. The mitral valve is abnormal. Moderate to severe  mitral valve regurgitation. No evidence  of mitral stenosis.   6. The aortic valve is tricuspid. Aortic valve regurgitation is trivial.  Aortic valve sclerosis/calcification is present, without any evidence of  aortic stenosis. Aortic valve area, by VTI measures 1.81 cm. Aortic valve  mean gradient measures 3.0 mmHg.   Aortic valve Vmax measures 1.15 m/s.   7. The inferior vena cava is dilated in size with <50% respiratory  variability, suggesting right atrial pressure of 15 mmHg.   FINDINGS   Left Ventricle: Left ventricular ejection fraction, by estimation, is  <20%. The left ventricle has severely decreased function. The left  ventricle demonstrates regional wall motion abnormalities. The left  ventricular internal cavity size was mildly  dilated. There is no left ventricular hypertrophy. Left ventricular  diastolic parameters are consistent with Grade III diastolic dysfunction  (restrictive). Elevated left ventricular end-diastolic pressure.   Right Ventricle: The right ventricular size is normal. No increase in  right ventricular wall thickness. Right ventricular systolic function is   moderately reduced. There is severely elevated pulmonary artery systolic  pressure. The tricuspid regurgitant  velocity is 3.79 m/s, and with an assumed right atrial pressure of 15  mmHg, the estimated right ventricular systolic pressure is 72.5 mmHg.   Left Atrium: Left atrial size was severely dilated.   Right Atrium: Right atrial size was mildly dilated.   Pericardium: There is no evidence of pericardial effusion.   Mitral Valve: Based on eccentricity of  MR and akinesis of the  inferolateral and lateral walls, suspect this is ischemic MR due to  restricted posterior mitral valve leaflet. The mitral valve is abnormal.  Moderate to severe mitral valve regurgitation, with   eccentric posteriorly directed jet. No evidence of mitral valve stenosis.   Tricuspid Valve: The tricuspid valve is normal in structure. Tricuspid  valve regurgitation is mild . No evidence of tricuspid stenosis.   Aortic Valve: The aortic valve is tricuspid. Aortic valve regurgitation is  trivial. Aortic valve sclerosis/calcification is present, without any  evidence of aortic stenosis. Aortic valve mean gradient measures 3.0 mmHg.  Aortic valve peak gradient  measures 5.3 mmHg. Aortic valve area, by VTI measures 1.81 cm.   Pulmonic Valve: The pulmonic valve was normal in structure. Pulmonic valve  regurgitation is not visualized. No evidence of pulmonic stenosis.   Aorta: The aortic root is normal in size and structure.   Venous: The inferior vena cava is dilated in size with less than 50%  respiratory variability, suggesting right atrial pressure of 15 mmHg.   IAS/Shunts: No atrial level shunt detected by color flow Doppler.   Additional Comments: A device lead is visualized.       Patient Profile     82 y.o. female hx of NICM secondary to frequent PVCs, CAD, HTN, HLD, DM II, PAD, CKD stage III, OSA, PAF and h/o ICD 01/24/2023 by Dr. Ladona Ridgel now admitted 04/05/23 with severe metabolic acidosis and  acute HFrEF.  Assessment & Plan    Acute on chronic HFrEF/CM --- Jan 2024 echo LVEF 20-25% no improvement in EF. - 03/2023 echo:LVEF  <20%, grade III dd, mod RV dysfunction, PASP 73, mod to severe MR probably ischemic/functional - admit Jan 2024 NSTEMI, cardiogenic shock at that time did require inotropes  - she has right IJ. COOX 73-->57-->61-->reportedly 97% this AM suspect error. CVP has been 12-14 yesterday does not have today. On exam today some LE edema, elevated JVD, mild crackles lungs.  - coox has not supported signifcant low output HF. Extremities are warm  --2D echo this admission reviewed showing severe LV dysfunction EF less than 20% with global hypokinesis and grade 3 diastolic dysfunction. There is akinesis of the mid to apical lateral and inferior lateral walls there is akinesis of the apical segment. There is also moderate reduction in RV function with severe pulmonary hypertension. Left atrium is severely dilated. There appears to be restricted posterior leaflet motion with moderate to severe mitral regurgitation consistent with ischemic MR.  - medical therapy limited by prior low output HF, prior issues with low bp's, prior groin infection avoding SGLT2i, current AKI --No further CVP or Co ox as IJ pulled out --I&Os incomplete.  --Wt down 3 kg since admit but no weight in 2 days and I&O's incomplete over the past 24 hours  --was on lasix 40 BID IV (at home was on torsemide 20 mg daily) but this has been stopped --GDMT limited by soft BP and AKI (had been on Entresto/Carvedilol in March but stopped due to soft BP --Jardiance stopped due to groin infection --no MRA due to hx of hyperkalemia on this --Nephrology has recommended sending her home on Torsemide 40mg  daily --labs today with K+ 3.3>>replete --SCr improved from 3.95<<1.85  CAD/STEMI 12/2022/Elevated Troponin --s/p posterior STEMI in January 2024 with EF at that time 20 to 25%. Cardiac cath at that time showed a 50%  pLAD, 20% mLAD, 50% D1, 30% pRCA and mRCA, 40% dRCA, 40% mleft circumflex and  sequential 60% lesions in the dleft circumflex, occluded subbranch of a large OM status post PCI.  --High-sensitivity troponin mildly elevated in the setting of AKI. This likely represents demand ischemia. She has not had any chest pain. She does have residual moderate nonobstructive disease in the left circumflex system but has not had any chest pain. No further ischemic workup at this time as she just had a cath recently in the setting of STEMI.  --continue Plavix 75mg  daily >> no ASA due to need for DOAC --statin on hold due to transaminitis>>will restart as outpt  AKI on CKD  --improved pk of 4.4 now 1.85 after stopping diuretics  PAF/VT(hx of prior VT arrest) --on amio and eliquis  --atrially pacing --normal TSH at 4.7 --ICD for secondary prevention  UTI/Metabolic acidosis/ALI  --pk AST/ALT 2195/1643 trending down  INR  was up to 5.6, down to 2.1  on the 27th.  --per primary team    I have spent a total of 40 minutes with patient reviewing 2D echo , telemetry, EKGs, labs and examining patient as well as establishing an assessment and plan that was discussed with the patient.  > 50% of time was spent in direct patient care.      For questions or updates, please contact [facility] Please consult [email or web address] for contact info under        Signed, Armanda Magic, MD  04/11/2023, 9:30 AM

## 2023-04-11 NOTE — Progress Notes (Signed)
PROGRESS NOTE    Natasha Woodard  ZOX:[id number] DOB: [birth year] DOA: 04/05/2023 PCP: [name], MD  82/F with prior nonischemic cardiomyopathy, EF had recovered and then admitted with lateral wall MI in 2/24, cath with distal circumflex occlusion, complicated by severe ischemic MR, low output heart failure.  Required balloon pump, subsequently removed for limb ischemia concerns, developed right femoral pseudoaneurysm. Had VT arrest 2/8, ICD placed 2/12  -Treated with prolonged course of milrinone, diuretics etc. and had multiple surgeries of right groin, debridement, wound VAC placement and change etc. subsequently went to CIR and discharged home 3/19. - Readmitted 4/19-22 with N/V/abd pain with unrevealing CT.  Found to have enterotoxigenic E. coli ; AKI Cr ~3 from recent baseline 1.3-1.6mg /dL . Improved with fluids, withholding ARB and diuretics to 2.4 at discharge.   -Return to the ED 4/24 with N/V and dyspnea-labs noted, K 6, Bicarb 11, BUN 57, Cr 4.3, AG 29, AST 804, ALT 723, BNP 2550; VBG 7.06, 38, lactate 9.   CXR mild pulm edema, due to increased WOB placed on bipap.  She was placed on bicarb drip and admitted to the ICU. -Cards and nephrology following, concern for low output failure -Has not been diuresed on account of renal failure -Echo noted EF less than 20%, severely elevated PA systolic pressure, severe MR -Transferred to [facility] service 4/27 from PCCM -Kidney function improving, intermittently dosed with IV Lasix   Subjective: -Continues to feel better overall, breathing is improving -Ambulated with PT yesterday  Assessment and Plan:  Acute on chronic systolic CHF -Echo with severe biventricular failure, EF less than 20%, moderately reduced RV, regional wall motion abnormalities, moderate to severe mitral regurgitation -Concern for septic shock +-cardiogenic shock in ICU with resultant severe ATN, lactic and metabolic acidosis, shock liver -Cards following, coox is  stable, CVP is elevated  -Repeat IV Lasix x 1 today, transition to oral torsemide tomorrow, appreciate nephrology input -Foley and central line discontinue -Discharge planning  Septic shock -Resolved, completed antibiotic course for UTI  Moderate to severe mitral regurgitation -TEE last month noted moderate MR, likely ischemic  History of CAD -Treated with PCI of left circumflex in 1/24 -Currently on Plavix and Eliquis in the setting of A-fib  AKI on CKD3b -Baseline creatinine 1.5-1.8, peaked at 4.4 this admission, now improving, down to 1.8 today -Suspect ATN in the setting of shock, prerenal etiology likely also contributing -Nephrology following, no hydronephrosis on imaging -Diuretic as noted above  Shock liver -Improving  History of recent VT arrest -ICD placed 2/12 -Continue amiodarone -Electrolytes are stable  Recent gastroenteritis, positive for enterotoxigenic E. Coli -CT with anasarca and bowel wall thickening, do not suspect diverticulitis -Completed 5 days of cefepime for UTI  Enterobacter and Proteus UTI -Day 5 of cefepime, discontinue antibiotics after today's dose, discontinued Foley catheter  -Monitor for retention   Paroxysmal atrial fibrillation -Currently in sinus rhythm, resume apixaban -Continue oral amiodarone  DVT prophylaxis: Eliquis Code Status: DNR Family Communication: None present, spouse at bedside yesterday Disposition Plan: Home with home health service tomorrow  Consultants: Cards, nephrology 3   Procedures:   Antimicrobials:    Objective: Vitals:   04/11/23 0241 04/11/23 0728 04/11/23 0911 04/11/23 1157  BP: 122/79 120/72  122/81  Pulse: 70 70 71 71  Resp: 13 19 (!) 24   Temp: 97.9 F (36.6 C) (!) 97.3 F (36.3 C)  98.3 F (36.8 C)  TempSrc: Oral Oral  Oral  SpO2: 100% 95% 93% 99%  Weight:  Height:        Intake/Output Summary (Last 24 hours) at 04/11/2023 1215 Last data filed at 04/11/2023 0900 Gross per 24  hour  Intake 720 ml  Output 201 ml  Net 519 ml   Filed Weights   04/06/23 1200 04/08/23 0500 04/09/23 0641  Weight: 77.2 kg 74.7 kg 74.7 kg    Examination:  General exam: Chronically ill elderly female sitting up in bed, AAOx3 HEENT: Positive JVD, right IJ central line CVS: S1-S2, regular rhythm, systolic murmur Lungs: Distant breath sounds otherwise clear Abdomen: Soft, nontender, bowel sounds present Extremities: Trace edema  Skin: No rashes Psychiatry:  Mood & affect appropriate.     Data Reviewed:   CBC: Recent Labs  Lab 04/05/23 1820 04/05/23 1839 04/07/23 1600 04/08/23 0355 04/09/23 0522 04/10/23 0603 04/11/23 0127  WBC 6.5   < > 8.3 6.7 4.5 4.1 3.7*  NEUTROABS 5.4  --   --   --   --   --   --   HGB 13.7   < > 10.9* 11.2* 11.0* 11.8* 11.6*  HCT 47.1*   < > 34.0* 36.5 36.1 37.6 36.5  MCV 100.6*   < > 93.7 95.1 95.8 93.3 91.7  PLT 251   < > 162 166  166 159 165 163   < > = values in this interval not displayed.   Basic Metabolic Panel: Recent Labs  Lab 04/06/23 0058 04/06/23 0729 04/07/23 0331 04/07/23 1507 04/08/23 0355 04/09/23 0522 04/10/23 0603 04/11/23 0127  NA 136   < > 142 139 140 139 140 137  K 6.0*   < > 4.4 4.2 4.2 4.0 3.8 3.3*  CL 97*   < > 101 98 101 97* 97* 97*  CO2 11*   < > 29 29 31  32 33* 32  GLUCOSE 260*   < > 155* 135* 104* 191* 140* 187*  BUN 62*   < > 67* 66* 58* 53* 45* 42*  CREATININE 4.22*   < > 3.95* 3.76* 3.19* 2.46* 1.98* 1.85*  CALCIUM 9.6   < > 8.8* 8.5* 8.8* 8.9 9.1 8.9  MG 2.4  --  2.3  --  2.2 1.9  --   --   PHOS 7.4*  --  4.8*  --  3.5 2.7  --   --    < > = values in this interval not displayed.   GFR: Estimated Creatinine Clearance: 22.2 mL/min (A) (by C-G formula based on SCr of 1.85 mg/dL (H)). Liver Function Tests: Recent Labs  Lab 04/05/23 1820 04/06/23 1509 04/07/23 1507 04/08/23 0355 04/10/23 0603  AST 804* 2,195* 823* 526* 129*  ALT 723* 1,643* 1,119* 957* 594*  ALKPHOS 262* 212* 229* 204* 219*   BILITOT 2.7* 1.8* 1.2 1.3* 1.0  PROT 7.2 6.2* 5.6* 5.8* 6.1*  ALBUMIN 3.7 3.4* 2.7* 2.8* 2.9*   Recent Labs  Lab 04/05/23 1820  LIPASE 28   No results for input(s): "AMMONIA" in the last 168 hours. Coagulation Profile: Recent Labs  Lab 04/07/23 1327 04/08/23 0355  INR 5.6* 2.1*   Cardiac Enzymes: No results for input(s): "CKTOTAL", "CKMB", "CKMBINDEX", "TROPONINI" in the last 168 hours. BNP (last 3 results) Recent Labs    06/09/22 1506  PROBNP 4,638*   HbA1C: No results for input(s): "HGBA1C" in the last 72 hours. CBG: Recent Labs  Lab 04/10/23 1110 04/10/23 1741 04/10/23 2120 04/11/23 0730 04/11/23 1155  GLUCAP 151* 125* 90 138* 280*   Lipid Profile: No results for input(s): "  CHOL", "HDL", "LDLCALC", "TRIG", "CHOLHDL", "LDLDIRECT" in the last 72 hours. Thyroid Function Tests: Recent Labs    04/11/23 0127  TSH 4.765*   Anemia Panel: No results for input(s): "VITAMINB12", "FOLATE", "FERRITIN", "TIBC", "IRON", "RETICCTPCT" in the last 72 hours. Urine analysis:    Component Value Date/Time   COLORURINE YELLOW 04/06/2023 1304   APPEARANCEUR CLEAR 04/06/2023 1304   LABSPEC 1.025 04/06/2023 1304   PHURINE 5.5 04/06/2023 1304   GLUCOSEU NEGATIVE 04/06/2023 1304   HGBUR LARGE (A) 04/06/2023 1304   BILIRUBINUR NEGATIVE 04/06/2023 1304   KETONESUR NEGATIVE 04/06/2023 1304   PROTEINUR 30 (A) 04/06/2023 1304   NITRITE NEGATIVE 04/06/2023 1304   LEUKOCYTESUR SMALL (A) 04/06/2023 1304   Sepsis Labs: @LABRCNTIP (procalcitonin:4,lacticidven:4)  ) Recent Results (from the past 240 hour(s))  MRSA Next Gen by PCR, Nasal     Status: None   Collection Time: 04/06/23  3:12 PM   Specimen: Nasal Mucosa; Nasal Swab  Result Value Ref Range Status   MRSA by PCR Next Gen NOT DETECTED NOT DETECTED Final    Comment: (NOTE) The GeneXpert MRSA Assay (FDA approved for NASAL specimens only), is one component of a comprehensive MRSA colonization surveillance program. It is  not intended to diagnose MRSA infection nor to guide or monitor treatment for MRSA infections. Test performance is not FDA approved in patients less than 29 years old. Performed at [facility], [address] [place], [place] [address]      Radiology Studies: No results found.   Scheduled Meds:  amiodarone  200 mg Oral Daily   apixaban  2.5 mg Oral BID   childrens multivitamin  1 tablet Oral Daily   clopidogrel  75 mg Oral Daily   feeding supplement  237 mL Oral BID BM   furosemide  40 mg Intravenous Once   hydrocerin   Topical BID   insulin aspart  1-3 Units Subcutaneous TID AC & HS   potassium chloride  40 mEq Oral Once   Continuous Infusions:     LOS: 6 days    Time spent:    Zannie Cove, MD [facility]   04/11/2023, 12:15 PM

## 2023-04-11 NOTE — Progress Notes (Signed)
Physical Therapy Treatment Patient Details Name: Natasha Woodard MRN: [id number] DOB: 1941-03-15 Today's Date: 04/11/2023   History of Present Illness 82 y/o woman who presented with recurrent abdominal symptoms with early satiety, distention, diffuse pain throughout her abdomen, nausea, and vomiting. recent admission and was discharged on 04/03/23 with ETEC gastroenteritis. Admitted for severe metabolic acidosis, lactic acidosis, concern for decompensated heart failure PMH: NSTEMI and cardiogenic shock with a complicated and prolonged admission 1/30-3/10/24,  HFrEF, A-fib, AKI on CKD IVOSA, HTN, HLD    PT Comments    Received pt sitting in recliner and agreeable to PT treatment. Pt performed all transfers with rollator and min guard and ambulated 134ft with rollator and min guard through hallway - limited by fatigue but vital signs remained WFL. Returned to room and pt left in recliner in care of RNs. Pt continues to be limited by generalized weakness/deconditioning and decreased balance strategies and would benefit from home health physical therapy upon discharge.     Recommendations for follow up therapy are one component of a multi-disciplinary discharge planning process, led by the attending physician.  Recommendations may be updated based on patient status, additional functional criteria and insurance authorization.  Follow Up Recommendations       Assistance Recommended at Discharge Frequent or constant Supervision/Assistance  Patient can return home with the following A little help with walking and/or transfers;A little help with bathing/dressing/bathroom;Assistance with cooking/housework;Assist for transportation;Help with stairs or ramp for entrance   Equipment Recommendations  None recommended by PT    Recommendations for Other Services       Precautions / Restrictions Precautions Precautions: None Precaution Comments: monitor O2 and HR Restrictions Weight Bearing  Restrictions: No     Mobility  Bed Mobility               General bed mobility comments: sitting in recliner upon arrival and departure Patient Response: Cooperative  Transfers Overall transfer level: Needs assistance Equipment used: Rollator (4 wheels) Transfers: Sit to/from Stand Sit to Stand: Min guard           General transfer comment: stood from recliner with rollator and min guard    Ambulation/Gait Ambulation/Gait assistance: Land (Feet): 110 Feet Assistive device: Rollator (4 wheels) Gait Pattern/deviations: Step-through pattern, Decreased step length - right, Decreased step length - left, Narrow base of support, Trunk flexed Gait velocity: decreased Gait velocity interpretation: <1.31 ft/sec, indicative of household ambulator   General Gait Details: min guard for line management, no LOB noted   Stairs             Wheelchair Mobility    Modified Rankin (Stroke Patients Only)       Balance Overall balance assessment: Needs assistance         Standing balance support: Bilateral upper extremity supported, During functional activity, Reliant on assistive device for balance (rollator) Standing balance-Leahy Scale: Fair Standing balance comment: able to maintain static standing balance with close supervision, required min guard for dynamic standing balance                            Cognition Arousal/Alertness: Awake/alert Behavior During Therapy: WFL for tasks assessed/performed Overall Cognitive Status: Within Functional Limits for tasks assessed  Exercises      General Comments General comments (skin integrity, edema, etc.): SPO2 100% on RA and HR 88bpm during ambulation      Pertinent Vitals/Pain Pain Assessment Pain Assessment: Faces Faces Pain Scale: Hurts a little bit Pain Location: low back (hx arthritis) Pain Descriptors / Indicators:  Aching, Sore Pain Intervention(s): Limited activity within patient's tolerance, Monitored during session, Repositioned    Home Living                          Prior Function            PT Goals (current goals can now be found in the care plan section) Acute Rehab PT Goals Patient Stated Goal: go home PT Goal Formulation: With patient Time For Goal Achievement: 04/22/23 Potential to Achieve Goals: Good Progress towards PT goals: Progressing toward goals    Frequency    Min 1X/week      PT Plan Current plan remains appropriate    Co-evaluation              AM-PAC PT "6 Clicks" Mobility   Outcome Measure  Help needed turning from your back to your side while in a flat bed without using bedrails?: None Help needed moving from lying on your back to sitting on the side of a flat bed without using bedrails?: None Help needed moving to and from a bed to a chair (including a wheelchair)?: A Little Help needed standing up from a chair using your arms (e.g., wheelchair or bedside chair)?: A Little Help needed to walk in hospital room?: A Little Help needed climbing 3-5 steps with a railing? : A Lot 6 Click Score: 19    End of Session   Activity Tolerance: Patient tolerated treatment well Patient left: in chair;with call bell/phone within reach;with nursing/sitter in room Nurse Communication: Mobility status PT Visit Diagnosis: Muscle weakness (generalized) (M62.81)     Time: 0981-1914 PT Time Calculation (min) (ACUTE ONLY): 16 min  Charges:  $Gait Training: 8-22 mins                     Raechel Chute PT, DPT  Alfonso Patten 04/11/2023, 2:03 PM

## 2023-04-11 NOTE — Progress Notes (Signed)
Occupational Therapy Treatment Patient Details Name: Natasha Woodard MRN: [id number] DOB: 1941/11/09 Today's Date: 04/11/2023   History of present illness 82 y/o woman who presented with recurrent abdominal symptoms with early satiety, distention, diffuse pain throughout her abdomen, nausea, and vomiting. recent admission and was discharged on 04/03/23 with ETEC gastroenteritis. Admitted for severe metabolic acidosis, lactic acidosis, concern for decompensated heart failure PMH: NSTEMI and cardiogenic shock with a complicated and prolonged admission 1/30-3/10/24,  HFrEF, A-fib, AKI on CKD IVOSA, HTN, HLD   OT comments  Pt continues to be limited by decreased activity tolerance, complete hall level ambulation ~81ft with Min guard assist + Rollator needing 3 standing rest breaks. Per pt she was able to walk further before. Reinforced education on CHF and discussed pacing self to allow for rest breaks prn. Pt needing reminders to lock breaks before transfers, attempted to have patient to walk 111ft but patient declined and was fatigued by the end of OT session with current ambulation. OT to continue to progress patient as able. DC plans remain appropriate for home OT services.    Recommendations for follow up therapy are one component of a multi-disciplinary discharge planning process, led by the attending physician.  Recommendations may be updated based on patient status, additional functional criteria and insurance authorization.    Assistance Recommended at Discharge Intermittent Supervision/Assistance  Patient can return home with the following  A little help with walking and/or transfers;A little help with bathing/dressing/bathroom;Assistance with cooking/housework;Assist for transportation;Help with stairs or ramp for entrance   Equipment Recommendations  BSC/3in1    Recommendations for Other Services      Precautions / Restrictions Precautions Precautions: None Precaution Comments:  monitor O2 and HR Restrictions Weight Bearing Restrictions: No       Mobility Bed Mobility               General bed mobility comments: Pt rec'd and left sitting in recliner, unable to fully assess    Transfers Overall transfer level: Needs assistance Equipment used: Rollator (4 wheels) Transfers: Sit to/from Stand Sit to Stand: Min guard           General transfer comment: verbal cues to lock RW before completing mobility. STS x2 from chair     Balance           Standing balance support: Bilateral upper extremity supported, During functional activity, Reliant on assistive device for balance Standing balance-Leahy Scale: Poor                             ADL either performed or assessed with clinical judgement   ADL                                              Extremity/Trunk Assessment              Vision       Perception     Praxis      Cognition Arousal/Alertness: Awake/alert Behavior During Therapy: WFL for tasks assessed/performed Overall Cognitive Status: Within Functional Limits for tasks assessed                                 General Comments: Pt very tired and reluctant to engage in threapy but willing to  stand at the very least        Exercises      Shoulder Instructions       General Comments VSS on RA, HR stable in 80s during functional ambulation    Pertinent Vitals/ Pain       Pain Assessment Pain Assessment: No/denies pain  Home Living                                          Prior Functioning/Environment              Frequency  Min 2X/week        Progress Toward Goals  OT Goals(current goals can now be found in the care plan section)  Progress towards OT goals: Progressing toward goals  Acute Rehab OT Goals Patient Stated Goal: go home OT Goal Formulation: With patient Time For Goal Achievement: 04/23/23 Potential to Achieve Goals:  Good  Plan Discharge plan remains appropriate;Frequency remains appropriate    Co-evaluation                 AM-PAC OT "6 Clicks" Daily Activity     Outcome Measure   Help from another person eating meals?: None Help from another person taking care of personal grooming?: A Little Help from another person toileting, which includes using toliet, bedpan, or urinal?: A Little Help from another person bathing (including washing, rinsing, drying)?: A Little Help from another person to put on and taking off regular upper body clothing?: A Little Help from another person to put on and taking off regular lower body clothing?: A Little 6 Click Score: 19    End of Session Equipment Utilized During Treatment: Gait belt;Rollator (4 wheels)  OT Visit Diagnosis: Unsteadiness on feet (R26.81);Muscle weakness (generalized) (M62.81)   Activity Tolerance Patient tolerated treatment well   Patient Left with call bell/phone within reach;in chair;with family/visitor present   Nurse Communication Mobility status        Time: 1610-9604 OT Time Calculation (min): 18 min  Charges: OT General Charges $OT Visit: 1 Visit OT Treatments $Therapeutic Activity: 8-22 mins  04/11/2023  AB, OTR/L  Acute Rehabilitation Services  Office: [phone]   Tristan Schroeder 04/11/2023, 5:15 PM

## 2023-04-12 DIAGNOSIS — I5023 Acute on chronic systolic (congestive) heart failure: Secondary | ICD-10-CM | POA: Diagnosis not present

## 2023-04-12 DIAGNOSIS — N179 Acute kidney failure, unspecified: Secondary | ICD-10-CM | POA: Diagnosis not present

## 2023-04-12 DIAGNOSIS — I340 Nonrheumatic mitral (valve) insufficiency: Secondary | ICD-10-CM

## 2023-04-12 DIAGNOSIS — E7800 Pure hypercholesterolemia, unspecified: Secondary | ICD-10-CM | POA: Diagnosis not present

## 2023-04-12 DIAGNOSIS — E872 Acidosis, unspecified: Secondary | ICD-10-CM | POA: Diagnosis not present

## 2023-04-12 DIAGNOSIS — I2510 Atherosclerotic heart disease of native coronary artery without angina pectoris: Secondary | ICD-10-CM | POA: Diagnosis not present

## 2023-04-12 DIAGNOSIS — N390 Urinary tract infection, site not specified: Secondary | ICD-10-CM

## 2023-04-12 LAB — BASIC METABOLIC PANEL
Anion gap: 10 (ref 5–15)
BUN: 38 mg/dL — ABNORMAL HIGH (ref 8–23)
CO2: 29 mmol/L (ref 22–32)
Calcium: 9.2 mg/dL (ref 8.9–10.3)
Chloride: 100 mmol/L (ref 98–111)
Creatinine, Ser: 1.86 mg/dL — ABNORMAL HIGH (ref 0.44–1.00)
GFR, Estimated: 27 mL/min — ABNORMAL LOW (ref >60)
Glucose, Bld: 200 mg/dL — ABNORMAL HIGH (ref 70–99)
Potassium: 4.4 mmol/L (ref 3.5–5.1)
Sodium: 139 mmol/L (ref 135–145)

## 2023-04-12 LAB — GLUCOSE, CAPILLARY
Glucose-Capillary: 164 mg/dL — ABNORMAL HIGH (ref 70–99)
Glucose-Capillary: 175 mg/dL — ABNORMAL HIGH (ref 70–99)
Glucose-Capillary: 145 mg/dL — ABNORMAL HIGH (ref 70–99)
Glucose-Capillary: 180 mg/dL — ABNORMAL HIGH (ref 70–99)

## 2023-04-12 MED ORDER — TORSEMIDE 20 MG PO TABS
40.0000 mg | ORAL_TABLET | Freq: Every day | ORAL | Status: DC
  Administered 2023-04-12 – 2023-04-14 (×3): 40 mg via ORAL
  Filled 2023-04-12 (×3): qty 2

## 2023-04-12 MED ORDER — LOPERAMIDE HCL 2 MG PO CAPS
4.0000 mg | ORAL_CAPSULE | Freq: Three times a day (TID) | ORAL | Status: DC | PRN
  Administered 2023-04-14: 4 mg via ORAL
  Filled 2023-04-12: qty 2

## 2023-04-12 NOTE — Assessment & Plan Note (Signed)
History of VT arrest. No sp ICD implantation.  Plan to continue amiodarone. Anticoagulation with apixaban.

## 2023-04-12 NOTE — Hospital Course (Addendum)
Natasha Woodard was admitted to the hospital with the working diagnosis of heart failure exacerbation in the setting of sepsis.   Natasha Woodard with prior nonischemic cardiomyopathy, EF had recovered and then admitted with lateral wall MI in 2/24, cath with distal circumflex occlusion, complicated by severe ischemic MR, low output heart failure.  Required balloon pump, subsequently removed for limb ischemia concerns, developed right femoral pseudoaneurysm. Had VT arrest 2/8, ICD placed 2/12  -Treated with prolonged course of milrinone, diuretics etc. and had multiple surgeries of right groin, debridement, wound VAC placement and change etc. subsequently went to CIR and discharged home 3/19.  Readmitted 4/19-22 with N/V/abd pain with unrevealing CT.  Found to have enterotoxigenic E. coli ; AKI Cr ~3 from recent baseline 1.3-1.6mg /dL . Improved with fluids, withholding ARB and diuretics at discharge.    Patient at home developed recurrent nausea, vomiting and abdominal pain. Positive worsening dyspnea, and abdominal pain that prompted her to come back to the hospital. On her initial physical examination Natasha Woodard was in respiratory distress and was placed on non invasive mechanical ventilation. Blood pressure was 122/69, HR 70, RR 23 and 02 saturation 100%, ill looking appearing, heart with S1 and S2 present and tachycardic, lungs with rales bilaterally, abdomen with generalized tenderness, and positive lower extremity edema.  VBG 7,06/ 39.5/ 44/ 12/ 61  NA 139, K 6.0 Cl 99, bicarbonate 11, glucose 62, bun 57, cr 4,29. Anion gap 29.  AST 804, ALT 723  BNP 2,550 High sensitive troponin 75 and 76  Lactic acid > 9.0  Wbc 6,5 hgb 13.7 plt 251   Urine analysis SG 1,025, protein 30, glucose negative, large hgb and small leukocytes,  Urinary Na 52.   Chest radiograph with cardiomegaly and bilateral hilar vascular congestion with bilateral central interstitial infiltrates. Positive fluid in the right fissure. Pacemaker  defibrillator in place with one atrial and on right ventricular lead.   CT abdomen and pelvis with sigmoid diverticulosis, possible mild diverticulitis, anasarca, hyperdense material in the gallbladder, possible stone, sludge, or excreted contrast.   EKG 70 bpm, right axis deviation, qtc 570, prolonged pr, atrial pacing, with ventricular pacing, no significant ST segment or T wave changes.   Natasha Woodard was placed on bicarb drip and admitted to the ICU. Right subclavian central line was placed.  -Cards and nephrology following, concern for low output failure -Has not been diuresed on account of renal failure -Echo noted EF less than 20%, severely elevated PA systolic pressure, severe MR  -Transferred to [facility] service 4/27 from PCCM -Kidney function improving, intermittently dosed with IV Lasix  05/03 patient with recurrent volume overload, transitioned back to IV furosemide for diuresis.  05/04 improving volume status plan for possible discharge tomorrow.  05/05 improved volume status, continue loop diuretic therapy at home and have close follow up as outpatient.

## 2023-04-12 NOTE — Progress Notes (Signed)
Physical Therapy Treatment Patient Details Name: Natasha Woodard MRN: [id number] DOB: Nov 01, 1941 Today's Date: 04/12/2023   History of Present Illness 82 y/o woman who presented with recurrent abdominal symptoms with early satiety, distention, diffuse pain throughout her abdomen, nausea, and vomiting. Recent admission and was discharged on 04/03/23 with ETEC gastroenteritis. Admitted for severe metabolic acidosis, lactic acidosis, concern for decompensated heart failure PMH: NSTEMI and cardiogenic shock with a complicated and prolonged admission 1/30-3/10/24,  HFrEF, A-fib, AKI on CKD IVOSA, HTN, HLD    PT Comments    Pt received in recliner, c/o urinary urgency and no nursing staff available to assist her, PTA entered room per request from unit secretary for pt safety. Pt agreeable to gait trial after she was assisted to bathroom, pt needing up to min guard with safety cues for use of rollator brakes and posture/activity pacing. SpO2 noisy signal while pt using rollator but DOE 2/4 and pt SpO2 WFL on RA pre/post exertion once good waveform achieved and HR WFL. Pt continues to benefit from PT services to progress toward functional mobility goals.    Recommendations for follow up therapy are one component of a multi-disciplinary discharge planning process, led by the attending physician.  Recommendations may be updated based on patient status, additional functional criteria and insurance authorization.  Follow Up Recommendations       Assistance Recommended at Discharge Frequent or constant Supervision/Assistance  Patient can return home with the following A little help with walking and/or transfers;A little help with bathing/dressing/bathroom;Assistance with cooking/housework;Assist for transportation;Help with stairs or ramp for entrance   Equipment Recommendations  None recommended by PT    Recommendations for Other Services       Precautions / Restrictions Precautions Precautions:  Fall Precaution Comments: monitor O2 and HR Restrictions Weight Bearing Restrictions: No     Mobility  Bed Mobility               General bed mobility comments: Pt up in chair before/after participation in therapies    Transfers Overall transfer level: Needs assistance Equipment used: Rollator (4 wheels) Transfers: Sit to/from Stand Sit to Stand: Min guard           General transfer comment: verbal cues to lock RW before completing mobility. STS from chair and to/from lower toilet height using wall rail for support    Ambulation/Gait Ambulation/Gait assistance: Min guard Gait Distance (Feet): 200 Feet (standing breaks x2) Assistive device: Rollator (4 wheels) Gait Pattern/deviations: Step-through pattern, Decreased step length - right, Decreased step length - left Gait velocity: decreased     General Gait Details: min guard for line management, activity pacing/pursed lip breathing, no LOB noted. Pt holding rollator too far advanced at times, cues for more upright posture and proximity to AD.   Stairs             Wheelchair Mobility    Modified Rankin (Stroke Patients Only)       Balance Overall balance assessment: Needs assistance Sitting-balance support: No upper extremity supported, Feet supported Sitting balance-Leahy Scale: Fair     Standing balance support: Bilateral upper extremity supported, During functional activity, Reliant on assistive device for balance Standing balance-Leahy Scale: Poor Standing balance comment: able to maintain static standing balance with close supervision, required min guard for dynamic standing balance                            Cognition Arousal/Alertness: Awake/alert Behavior During Therapy:  WFL for tasks assessed/performed Overall Cognitive Status: Within Functional Limits for tasks assessed                                 General Comments: Pt eager to participate after mobilizing  to toilet        Exercises Other Exercises Other Exercises: reviewed reclined/supine BLE AROM: ankle pumps x10 reps ea    General Comments General comments (skin integrity, edema, etc.): SpO2 noisy signal during gait trial due to hand position on RW/tight sensor, once sensor replaced onto her thumb, Spo2 reading WFL on RA 96% with exertion. HR 75 bpm prior to exertional tasks and [facility] throughout. DOE 2/4      Pertinent Vitals/Pain Pain Assessment Pain Assessment: No/denies pain Pain Intervention(s): Monitored during session, Repositioned    Home Living                          Prior Function            PT Goals (current goals can now be found in the care plan section) Acute Rehab PT Goals Patient Stated Goal: go home PT Goal Formulation: With patient Time For Goal Achievement: 04/22/23 Progress towards PT goals: Progressing toward goals    Frequency    Min 1X/week      PT Plan Current plan remains appropriate    Co-evaluation              AM-PAC PT "6 Clicks" Mobility   Outcome Measure  Help needed turning from your back to your side while in a flat bed without using bedrails?: None Help needed moving from lying on your back to sitting on the side of a flat bed without using bedrails?: None Help needed moving to and from a bed to a chair (including a wheelchair)?: A Little Help needed standing up from a chair using your arms (e.g., wheelchair or bedside chair)?: A Little Help needed to walk in hospital room?: A Little Help needed climbing 3-5 steps with a railing? : A Lot 6 Click Score: 19    End of Session Equipment Utilized During Treatment: Gait belt Activity Tolerance: Patient tolerated treatment well Patient left: in chair;with call bell/phone within reach;with nursing/sitter in room Nurse Communication: Mobility status PT Visit Diagnosis: Muscle weakness (generalized) (M62.81)     Time: 1650-1710 PT Time Calculation (min) (ACUTE  ONLY): 20 min  Charges:  $Gait Training: 8-22 mins                     Raini Tiley P., PTA Acute Rehabilitation Services Secure Chat Preferred 9a-5:30pm Office: [phone]    Angus Palms 04/12/2023, 5:39 PM

## 2023-04-12 NOTE — Assessment & Plan Note (Addendum)
Patient had PCI to left circumflex on 01/24.  Continue on clopidogrel

## 2023-04-12 NOTE — Progress Notes (Signed)
Patient refused to have her weight done this morning. Saying she didn't want to get up and would do it later.  Harriet Masson, RN

## 2023-04-12 NOTE — Assessment & Plan Note (Signed)
Septic shock, shock liver.  Enterobacter and Proteus infection.   Patient was treated with cefepime for 5 days with good toleration.   Recent gastroenteritis positive for enterotoxigenic E Coli.  CT scan with anasarca and bowel wall thickening.

## 2023-04-12 NOTE — Progress Notes (Addendum)
Progress Note   Patient: Natasha Woodard:[id number] DOB: 26-Sep-1941 DOA: 04/05/2023     7 DOS: the patient was seen and examined on 04/12/2023   Brief hospital course: 81/F with prior nonischemic cardiomyopathy, EF had recovered and then admitted with lateral wall MI in 2/24, cath with distal circumflex occlusion, complicated by severe ischemic MR, low output heart failure.  Required balloon pump, subsequently removed for limb ischemia concerns, developed right femoral pseudoaneurysm. Had VT arrest 2/8, ICD placed 2/12  -Treated with prolonged course of milrinone, diuretics etc. and had multiple surgeries of right groin, debridement, wound VAC placement and change etc. subsequently went to CIR and discharged home 3/19. - Readmitted 4/19-22 with N/V/abd pain with unrevealing CT.  Found to have enterotoxigenic E. coli ; AKI Cr ~3 from recent baseline 1.3-1.6mg /dL . Improved with fluids, withholding ARB and diuretics to 2.4 at discharge.   -Return to the ED 4/24 with N/V and dyspnea-labs noted, K 6, Bicarb 11, BUN 57, Cr 4.3, AG 29, AST 804, ALT 723, BNP 2550; VBG 7.06, 38, lactate 9.   CXR mild pulm edema, due to increased WOB placed on bipap.  She was placed on bicarb drip and admitted to the ICU. -Cards and nephrology following, concern for low output failure -Has not been diuresed on account of renal failure -Echo noted EF less than 20%, severely elevated PA systolic pressure, severe MR -Transferred to [facility] service 4/27 from PCCM -Kidney function improving, intermittently dosed with IV Lasix  Assessment and Plan: Acute on chronic systolic heart failure (HCC) Echocardiogram with reduced LV systolic function EF < 20%, positive regional wall motion abnormalities, akinesis of the inferior lateral and lateral walls, RV with moderate reduction in systolic function, RVSP 72.5. LA with severe dilatation, moderate to severe mitral regurgitation.   Patient has negative fluid balance.  Systolic blood  pressure is 104 to 118 mmHg.  Positive lower extremity edema.   Plan to resume diuretic therapy with torsemide. Limited pharmacologic options due to low blood pressure and reduced GFR.   Sepsis secondary to UTI [facility]) Septic shock, shock liver.  Enterobacter and Proteus infection.   Patient was treated with cefepime for 5 days with good toleration.   Recent gastroenteritis positive for enterotoxigenic E Coli.  CT scan with anasarca and bowel wall thickening.   Coronary artery disease due to lipid rich plaque Patient had PCI to left circumflex on 01/24.  Continue on clopidogrel and aspirin.  Acute renal failure (HCC) CKD stage 3b, hypokalemia, ATN due to shock.   Volume status has improved, renal function with serum cr at 1,86 with K at 4,4 and serum bicarbonate at 29, Na 139   Plan to resume torsemide for diuresis. Follow up renal function in am,   Essential hypertension Continue blood pressure monitoring.   PAF (paroxysmal atrial fibrillation) (HCC) History of VT arrest. No sp ICD implantation.  Plan to continue amiodarone. Anticoagulation with apixaban.          Subjective: Patient is feeling better, continue to have edema lower extremities, no dyspnea or chest pain   Physical Exam: Vitals:   04/11/23 2313 04/12/23 0800 04/12/23 0802 04/12/23 1321  BP: 118/77  107/75 114/73  Pulse: 72 73 71 71  Resp: 20 20 16 18   Temp: 98.1 F (36.7 C)  97.9 F (36.6 C) 98.2 F (36.8 C)  TempSrc: Oral  Oral Oral  SpO2: 95% (!) 87% 97% 97%  Weight:      Height:  Neurology awake and alert ENT with mild pallor Cardiovascular with S1 and S2 present with no gallops or murmurs No JVD Positive lower extremity edema +  Respiratory with no rales or wheezing, with no rhonchi Abdomen with no distention  Data Reviewed:    Family Communication: I spoke with patient's husband at the bedside, we talked in detail about patient's condition, plan of care and prognosis and  all questions were addressed.   Disposition: Status is: Inpatient Remains inpatient appropriate because: possible dc in 48 hrs   Planned Discharge Destination: Home    Author: Coralie Keens, MD 04/12/2023 4:25 PM  For on call review [email or web address].

## 2023-04-12 NOTE — Assessment & Plan Note (Addendum)
Continue blood pressure monitoring.  No RAAS inhibition due to low GFR and risk of hypotension. No B blocker due to low output heart failure.  Follow up as outpatient.

## 2023-04-12 NOTE — Progress Notes (Signed)
Rounding Note    Patient Name: Natasha Woodard Date of Encounter: 04/12/2023  [facility] Cardiologist: Bryan Lemma, MD   Subjective   Feels good today.  She says she thinks she is back to baseline with her breathing.  Inpatient Medications    Scheduled Meds:  amiodarone  200 mg Oral Daily   apixaban  2.5 mg Oral BID   childrens multivitamin  1 tablet Oral Daily   clopidogrel  75 mg Oral Daily   feeding supplement  237 mL Oral BID BM   hydrocerin   Topical BID   insulin aspart  0-9 Units Subcutaneous TID WC   Continuous Infusions:  PRN Meds: acetaminophen, docusate sodium, ondansetron (ZOFRAN) IV, ondansetron, mouth rinse, polyethylene glycol, traMADol, white petrolatum   Vital Signs    Vitals:   04/11/23 1724 04/11/23 1934 04/11/23 2313 04/12/23 0802  BP: 116/78 111/78 118/77 107/75  Pulse: 70 69 72 71  Resp: 15 17 20 16   Temp: 98.1 F (36.7 C) 98.2 F (36.8 C) 98.1 F (36.7 C) 97.9 F (36.6 C)  TempSrc: Oral Oral Oral Oral  SpO2: 100% 100% 95% 97%  Weight:      Height:        Intake/Output Summary (Last 24 hours) at 04/12/2023 4540 Last data filed at 04/11/2023 1939 Gross per 24 hour  Intake 528 ml  Output --  Net 528 ml       04/09/2023    6:41 AM 04/08/2023    5:00 AM 04/06/2023   12:00 PM  Last 3 Weights  Weight (lbs) 164 lb 11.2 oz 164 lb 11.2 oz 170 lb 3.1 oz  Weight (kg) 74.707 kg 74.707 kg 77.2 kg      Telemetry    Atrial paced rhythm- personally Reviewed  ECG    No new - Personally Reviewed  Physical Exam   GEN: Well nourished, well developed in no acute distress HEENT: Normal NECK: No JVD; No carotid bruits LYMPHATICS: No lymphadenopathy CARDIAC:RRR, no murmurs, rubs, gallops RESPIRATORY:  Clear to auscultation without rales, wheezing or rhonchi  ABDOMEN: Soft, non-tender, non-distended MUSCULOSKELETAL: 1+ lower extremity edema; No deformity  SKIN: Warm and dry NEUROLOGIC:  Alert and oriented x 3 PSYCHIATRIC:   Normal affect  Labs    High Sensitivity Troponin:   Recent Labs  Lab 03/31/23 0706 03/31/23 0930 04/05/23 1820 04/05/23 2050  TROPONINIHS 45* 40* 75* 76*      Chemistry Recent Labs  Lab 04/07/23 0331 04/07/23 1507 04/08/23 0355 04/09/23 0522 04/10/23 0603 04/11/23 0127 04/12/23 0238  NA 142 139 140 139 140 137 139  K 4.4 4.2 4.2 4.0 3.8 3.3* 4.4  CL 101 98 101 97* 97* 97* 100  CO2 29 29 31  32 33* 32 29  GLUCOSE 155* 135* 104* 191* 140* 187* 200*  BUN 67* 66* 58* 53* 45* 42* 38*  CREATININE 3.95* 3.76* 3.19* 2.46* 1.98* 1.85* 1.86*  CALCIUM 8.8* 8.5* 8.8* 8.9 9.1 8.9 9.2  MG 2.3  --  2.2 1.9  --   --   --   PROT  --  5.6* 5.8*  --  6.1*  --   --   ALBUMIN  --  2.7* 2.8*  --  2.9*  --   --   AST  --  823* 526*  --  129*  --   --   ALT  --  1,119* 957*  --  594*  --   --   ALKPHOS  --  229* 204*  --  219*  --   --   BILITOT  --  1.2 1.3*  --  1.0  --   --   GFRNONAA 11* 11* 14* 19* 25* 27* 27*  ANIONGAP 12 12 8 10 10 8 10      Lipids No results for input(s): "CHOL", "TRIG", "HDL", "LABVLDL", "LDLCALC", "CHOLHDL" in the last 168 hours.  Hematology Recent Labs  Lab 04/09/23 0522 04/10/23 0603 04/11/23 0127  WBC 4.5 4.1 3.7*  RBC 3.77* 4.03 3.98  HGB 11.0* 11.8* 11.6*  HCT 36.1 37.6 36.5  MCV 95.8 93.3 91.7  MCH 29.2 29.3 29.1  MCHC 30.5 31.4 31.8  RDW 15.8* 15.5 15.5  PLT 159 165 163    Thyroid  Recent Labs  Lab 04/11/23 0127  TSH 4.765*     BNP Recent Labs  Lab 04/05/23 1820 04/06/23 0058  BNP 2,550.1* 2,843.8*     DDimer  Recent Labs  Lab 04/07/23 1327 04/08/23 0355  DDIMER 7.98* 15.18*      Radiology    No results found.  Cardiac Studies   ECHO 04/06/23  IMPRESSIONS    1. Left ventricular ejection fraction, by estimation, is <20%. The left  ventricle has severely decreased function. The left ventricle demonstrate  severe global hypokinesis and regional wall motion abnormalities (see  scoring diagram/findings for   description). The left ventricular internal cavity size was mildly  dilated. Left ventricular diastolic parameters are consistent with Grade  III diastolic dysfunction (restrictive). Elevated left ventricular  end-diastolic pressure. There is akinesis of  the left ventricular, mid-apical lateral wall and inferolateral wall.  There is akinesis of the left ventricular, apical segment.   2. Right ventricular systolic function is moderately reduced. The right  ventricular size is normal. There is severely elevated pulmonary artery  systolic pressure. The estimated right ventricular systolic pressure is  72.5 mmHg.   3. Left atrial size was severely dilated.   4. Right atrial size was mildly dilated.   5. Based on eccentricity of MR and akinesis of the inferolateral and  lateral walls, suspect this is ischemic MR due to restricted posterior  mitral valve leaflet.. The mitral valve is abnormal. Moderate to severe  mitral valve regurgitation. No evidence  of mitral stenosis.   6. The aortic valve is tricuspid. Aortic valve regurgitation is trivial.  Aortic valve sclerosis/calcification is present, without any evidence of  aortic stenosis. Aortic valve area, by VTI measures 1.81 cm. Aortic valve  mean gradient measures 3.0 mmHg.   Aortic valve Vmax measures 1.15 m/s.   7. The inferior vena cava is dilated in size with <50% respiratory  variability, suggesting right atrial pressure of 15 mmHg.   FINDINGS   Left Ventricle: Left ventricular ejection fraction, by estimation, is  <20%. The left ventricle has severely decreased function. The left  ventricle demonstrates regional wall motion abnormalities. The left  ventricular internal cavity size was mildly  dilated. There is no left ventricular hypertrophy. Left ventricular  diastolic parameters are consistent with Grade III diastolic dysfunction  (restrictive). Elevated left ventricular end-diastolic pressure.   Right Ventricle: The right  ventricular size is normal. No increase in  right ventricular wall thickness. Right ventricular systolic function is  moderately reduced. There is severely elevated pulmonary artery systolic  pressure. The tricuspid regurgitant  velocity is 3.79 m/s, and with an assumed right atrial pressure of 15  mmHg, the estimated right ventricular systolic pressure is 72.5 mmHg.   Left Atrium: Left  atrial size was severely dilated.   Right Atrium: Right atrial size was mildly dilated.   Pericardium: There is no evidence of pericardial effusion.   Mitral Valve: Based on eccentricity of MR and akinesis of the  inferolateral and lateral walls, suspect this is ischemic MR due to  restricted posterior mitral valve leaflet. The mitral valve is abnormal.  Moderate to severe mitral valve regurgitation, with   eccentric posteriorly directed jet. No evidence of mitral valve stenosis.   Tricuspid Valve: The tricuspid valve is normal in structure. Tricuspid  valve regurgitation is mild . No evidence of tricuspid stenosis.   Aortic Valve: The aortic valve is tricuspid. Aortic valve regurgitation is  trivial. Aortic valve sclerosis/calcification is present, without any  evidence of aortic stenosis. Aortic valve mean gradient measures 3.0 mmHg.  Aortic valve peak gradient  measures 5.3 mmHg. Aortic valve area, by VTI measures 1.81 cm.   Pulmonic Valve: The pulmonic valve was normal in structure. Pulmonic valve  regurgitation is not visualized. No evidence of pulmonic stenosis.   Aorta: The aortic root is normal in size and structure.   Venous: The inferior vena cava is dilated in size with less than 50%  respiratory variability, suggesting right atrial pressure of 15 mmHg.   IAS/Shunts: No atrial level shunt detected by color flow Doppler.   Additional Comments: A device lead is visualized.       Patient Profile     82 y.o. female hx of NICM secondary to frequent PVCs, CAD, HTN, HLD, DM II, PAD,  CKD stage III, OSA, PAF and h/o ICD 01/24/2023 by Dr. Ladona Ridgel now admitted 04/05/23 with severe metabolic acidosis and acute HFrEF.  Assessment & Plan    Acute on chronic HFrEF/CM --- Jan 2024 echo LVEF 20-25% no improvement in EF. - 03/2023 echo:LVEF  <20%, grade III dd, mod RV dysfunction, PASP 73, mod to severe MR probably ischemic/functional - admit Jan 2024 NSTEMI, cardiogenic shock at that time did require inotropes  - she has right IJ. COOX 73-->57-->61-->reportedly 97% this AM suspect error. CVP has been 12-14 yesterday does not have today. On exam today some LE edema, elevated JVD, mild crackles lungs.  - coox has not supported signifcant low output HF. Extremities are warm  --2D echo this admission reviewed showing severe LV dysfunction EF less than 20% with global hypokinesis and grade 3 diastolic dysfunction. There is akinesis of the mid to apical lateral and inferior lateral walls there is akinesis of the apical segment. There is also moderate reduction in RV function with severe pulmonary hypertension. Left atrium is severely dilated. There appears to be restricted posterior leaflet motion with moderate to severe mitral regurgitation consistent with ischemic MR.  - medical therapy limited by prior low output HF, prior issues with low bp's, prior groin infection avoding SGLT2i, current AKI --No further CVP or Co ox as IJ pulled out --I&Os incomplete.  --Wt down 3 kg since admit but no weight in 2 days and I&O's incomplete over the past 48 hours  --was on lasix 40 BID IV (at home was on torsemide 20 mg daily) but this has been stopped --GDMT limited by soft BP and AKI (had been on Entresto/Carvedilol in March but stopped due to soft BP) --Jardiance stopped due to groin infection --no MRA due to hx of hyperkalemia on this --Nephrology has recommended sending her home on Torsemide 40mg  daily --Potassium 4.4  --SCr improved from 3.95>>1.85>>1.86 --I think she is back to her baseline and  stable  from a cardiac standpoint for discharge. --she has followup in AHF clinic next week and hopefully can restart GDMT if BP and renal function allows  CAD/STEMI 12/2022/Elevated Troponin --s/p posterior STEMI in January 2024 with EF at that time 20 to 25%. Cardiac cath at that time showed a 50% pLAD, 20% mLAD, 50% D1, 30% pRCA and mRCA, 40% dRCA, 40% mleft circumflex and sequential 60% lesions in the dleft circumflex, occluded subbranch of a large OM status post PCI.  --High-sensitivity troponin mildly elevated in the setting of AKI. This likely represents demand ischemia. She has not had any chest pain. She does have residual moderate nonobstructive disease in the left circumflex system but has not had any chest pain. No further ischemic workup at this time as she just had a cath recently in the setting of STEMI.  --continue Plavix 75mg  daily >> no ASA due to need for DOAC --statin on hold due to transaminitis>>will restart as outpt  AKI on CKD  --improved pk of 4.4 now 1.86 after stopping diuretics  PAF/VT(hx of prior VT arrest) --on amio and eliquis  --atrially pacing --normal TSH at 4.7 --ICD for secondary prevention  UTI/Metabolic acidosis/ALI  --pk AST/ALT 2195/1643 trending down  INR  was up to 5.6, down to 2.1  on the 27th.  --per primary team   [facility] will sign off.   Medication Recommendations: Amiodarone 200 mg daily, apixaban 2.5 mg twice daily, Plavix 75 mg daily and Demadex 40 mg daily Other recommendations (labs, testing, etc): Bmet in 1 week Follow up as an outpatient: She has an appointment with cardiology on 04/20/2023  I have spent a total of 35 minutes with patient reviewing 2D echo , telemetry, EKGs, labs and examining patient as well as establishing an assessment and plan that was discussed with the patient.  > 50% of time was spent in direct patient care.    For questions or updates, please contact [facility] Please consult [email or web address] for  contact info under        Signed, Armanda Magic, MD  04/12/2023, 9:28 AM

## 2023-04-12 NOTE — Progress Notes (Signed)
Pt refusing bipap for the night. ?

## 2023-04-12 NOTE — Assessment & Plan Note (Addendum)
CKD stage 3b, hypokalemia, ATN due to shock.  Severe anion gap metabolic acidosis, lactic acidosis.   Her volume status has improved, at the time of her discharge her serum cr is 1,83 with K at 4,1 and serum bicarbonate at 30. Na 140 and Mg 1,8   Plan to continue diuresis with oral torsemide 40 mg po daily.  Add 2 g mag sulfate prior to her discharge. Follow up renal function and electrolytes as outpatient.

## 2023-04-12 NOTE — Assessment & Plan Note (Addendum)
Echocardiogram with reduced LV systolic function EF < 20%, positive regional wall motion abnormalities, akinesis of the inferior lateral and lateral walls, RV with moderate reduction in systolic function, RVSP 72.5. LA with severe dilatation, moderate to severe mitral regurgitation.   Urine output not documented.  Improved volume status.   Systolic blood pressure 120 to 114  mmHg.  Plan to resume furosemide 80 mg IV q12 hrs.   Limited pharmacologic options due to low blood pressure and reduced GFR.

## 2023-04-13 DIAGNOSIS — A419 Sepsis, unspecified organism: Secondary | ICD-10-CM | POA: Diagnosis not present

## 2023-04-13 DIAGNOSIS — I2583 Coronary atherosclerosis due to lipid rich plaque: Secondary | ICD-10-CM

## 2023-04-13 DIAGNOSIS — I2510 Atherosclerotic heart disease of native coronary artery without angina pectoris: Secondary | ICD-10-CM | POA: Diagnosis not present

## 2023-04-13 DIAGNOSIS — I480 Paroxysmal atrial fibrillation: Secondary | ICD-10-CM

## 2023-04-13 DIAGNOSIS — N179 Acute kidney failure, unspecified: Secondary | ICD-10-CM | POA: Diagnosis not present

## 2023-04-13 DIAGNOSIS — N390 Urinary tract infection, site not specified: Secondary | ICD-10-CM

## 2023-04-13 DIAGNOSIS — I5023 Acute on chronic systolic (congestive) heart failure: Secondary | ICD-10-CM | POA: Diagnosis not present

## 2023-04-13 LAB — BASIC METABOLIC PANEL
Anion gap: 8 (ref 5–15)
BUN: 46 mg/dL — ABNORMAL HIGH (ref 8–23)
CO2: 32 mmol/L (ref 22–32)
Calcium: 9.1 mg/dL (ref 8.9–10.3)
Chloride: 97 mmol/L — ABNORMAL LOW (ref 98–111)
Creatinine, Ser: 1.97 mg/dL — ABNORMAL HIGH (ref 0.44–1.00)
GFR, Estimated: 25 mL/min — ABNORMAL LOW (ref >60)
Glucose, Bld: 136 mg/dL — ABNORMAL HIGH (ref 70–99)
Potassium: 4.2 mmol/L (ref 3.5–5.1)
Sodium: 137 mmol/L (ref 135–145)

## 2023-04-13 LAB — GLUCOSE, CAPILLARY
Glucose-Capillary: 120 mg/dL — ABNORMAL HIGH (ref 70–99)
Glucose-Capillary: 220 mg/dL — ABNORMAL HIGH (ref 70–99)
Glucose-Capillary: 142 mg/dL — ABNORMAL HIGH (ref 70–99)
Glucose-Capillary: 172 mg/dL — ABNORMAL HIGH (ref 70–99)

## 2023-04-13 LAB — MAGNESIUM: Magnesium: 1.8 mg/dL (ref 1.7–2.4)

## 2023-04-13 MED ORDER — GLUCERNA SHAKE PO LIQD
237.0000 mL | Freq: Two times a day (BID) | ORAL | Status: DC
  Administered 2023-04-13 – 2023-04-15 (×2): 237 mL via ORAL

## 2023-04-13 MED ORDER — METOLAZONE 5 MG PO TABS
2.5000 mg | ORAL_TABLET | Freq: Once | ORAL | Status: AC
  Administered 2023-04-13: 2.5 mg via ORAL
  Filled 2023-04-13: qty 1

## 2023-04-13 MED ORDER — MAGNESIUM SULFATE 2 GM/50ML IV SOLN
2.0000 g | Freq: Once | INTRAVENOUS | Status: AC
  Administered 2023-04-13: 2 g via INTRAVENOUS
  Filled 2023-04-13: qty 50

## 2023-04-13 NOTE — Progress Notes (Signed)
Initial Nutrition Assessment  DOCUMENTATION CODES:   Not applicable  INTERVENTION:  Change ensure to Glucerna Shake po BID, each supplement provides 220 kcal and 10 grams of protein Education on CKD and CHF handouts provided  Encouraged po intake    NUTRITION DIAGNOSIS:   Inadequate oral intake related to poor appetite, nausea as evidenced by per patient/family report, mild fat depletion, mild muscle depletion.   GOAL:   Patient will meet greater than or equal to 90% of their needs   MONITOR:   PO intake, Labs, I & O's, Supplement acceptance, Weight trends, Skin  REASON FOR ASSESSMENT:   Malnutrition Screening Tool    ASSESSMENT:   82 y.o. female with PMHx including T2DM, CAD hx of MI 01/2023 (complicated course), HFpEF, PAD, OSA, a-fib, CKD 3, HTN, HLD who was admitted for N/V, severe acidosis and AKI  Visited patient to deliver extensive diet edu. RD spent over an hour with patient who was very involved in the edu and adamant about changing her lifestlye. She reports good po intake and denies N/V/D/C.   Labs: Glu 136, BUN 46, Cr 1.97 Meds: children's MVI, Glucerna BID, insulin, magnesium sulfate, demadex, zofran PRN PO: 50-100% po intake at most meals   NUTRITION - FOCUSED PHYSICAL EXAM:  Flowsheet Row Most Recent Value  Orbital Region Mild depletion  Upper Arm Region Mild depletion  Thoracic and Lumbar Region No depletion  Buccal Region Unable to assess  Temple Region Mild depletion  Clavicle Bone Region No depletion  Clavicle and Acromion Bone Region No depletion  Scapular Bone Region Unable to assess  Dorsal Hand Moderate depletion  Patellar Region Unable to assess  Anterior Thigh Region Unable to assess  Posterior Calf Region Unable to assess  Edema (RD Assessment) Moderate  Hair Unable to assess  Eyes Reviewed  Mouth Reviewed  Skin Reviewed  Nails Reviewed       Diet Order:   Diet Order             Diet renal/carb modified with fluid  restriction Diet-HS Snack? Nothing; Fluid restriction: 1800 mL Fluid; Room service appropriate? Yes; Fluid consistency: Thin  Diet effective now                   EDUCATION NEEDS:   Education needs have been addressed  Skin:  Skin Assessment: Reviewed RN Assessment  Last BM:  4/30  Height:   Ht Readings from Last 1 Encounters:  04/05/23 5\' 2"  (1.575 m)    Weight:   Wt Readings from Last 1 Encounters:  04/13/23 75.5 kg    BMI:  Body mass index is 30.44 kg/m.  Estimated Nutritional Needs:   Kcal:  1500-1700 kcal  Protein:  90-115 g  Fluid:  > 1.5L    Leodis Rains, RDN, LDN  Clinical Nutrition

## 2023-04-13 NOTE — Progress Notes (Signed)
Pt assessed for PRN Bipap order. No distress noted. Pt resting comfortably. Vitals stable. No BIPAP needed at this time.  

## 2023-04-13 NOTE — Plan of Care (Signed)
Nutrition Education Note  RD consulted for nutrition education regarding new onset CHF.  RD provided "Low Sodium, Chronic Kidney Disease, and Fluid Restriction Nutrition Therapy" handout from the Academy of Nutrition and Dietetics. Reviewed patient's dietary recall. Provided examples on ways to decrease sodium intake in diet. Discouraged intake of processed foods and use of salt shaker. Encouraged fresh fruits and vegetables as well as whole grain sources of carbohydrates to maximize fiber intake.   RD discussed why it is important for patient to adhere to diet recommendations, and emphasized the role of fluids, foods to avoid, and importance of weighing self daily. Teach back method used.  Expect optimal compliance.  Body mass index is 30.44 kg/m. Pt meets criteria for obese class I based on current BMI.  Current diet order is renal/carb, 1800 ml FR , patient is consuming approximately 50% of meals at this time. Labs and medications reviewed. No further nutrition interventions warranted at this time. RD contact information provided. If additional nutrition issues arise, please re-consult RD.   Leodis Rains, RDN, LDN  Clinical Nutrition

## 2023-04-13 NOTE — Progress Notes (Signed)
  Transition of Care [facility]) Screening Note   Patient Details  Name: Natasha Woodard Date of Birth: [birth year]   Transition of Care [facility]) CM/SW Contact:    Darrold Span, RN Phone Number: 04/13/2023, 4:05 PM    Transition of Care Department [facility]) has reviewed patient. CM has been notified by [facility] liaison that pt is active with HRN- will need resumption orders for discharage as well as adding PT/OT per therapy recommendations.  We will continue to monitor patient advancement through interdisciplinary progression rounds. If new patient transition needs arise, please place a TOC consult.

## 2023-04-13 NOTE — Care Management Important Message (Signed)
Important Message  Patient Details  Name: Natasha Woodard MRN: [id number] Date of Birth: 26-Dec-1940   Medicare Important Message Given:  Yes     Renie Ora 04/13/2023, 11:31 AM

## 2023-04-13 NOTE — Progress Notes (Addendum)
Progress Note   Patient: Natasha Woodard ZOX:[id number] DOB: 1941/04/15 DOA: 04/05/2023     8 DOS: the patient was seen and examined on 04/13/2023   Brief hospital course: 81/F with prior nonischemic cardiomyopathy, EF had recovered and then admitted with lateral wall MI in 2/24, cath with distal circumflex occlusion, complicated by severe ischemic MR, low output heart failure.  Required balloon pump, subsequently removed for limb ischemia concerns, developed right femoral pseudoaneurysm. Had VT arrest 2/8, ICD placed 2/12  -Treated with prolonged course of milrinone, diuretics etc. and had multiple surgeries of right groin, debridement, wound VAC placement and change etc. subsequently went to CIR and discharged home 3/19. - Readmitted 4/19-22 with N/V/abd pain with unrevealing CT.  Found to have enterotoxigenic E. coli ; AKI Cr ~3 from recent baseline 1.3-1.6mg /dL . Improved with fluids, withholding ARB and diuretics to 2.4 at discharge.   -Return to the ED 4/24 with N/V and dyspnea-labs noted, K 6, Bicarb 11, BUN 57, Cr 4.3, AG 29, AST 804, ALT 723, BNP 2550; VBG 7.06, 38, lactate 9.   CXR mild pulm edema, due to increased WOB placed on bipap.  She was placed on bicarb drip and admitted to the ICU. -Cards and nephrology following, concern for low output failure -Has not been diuresed on account of renal failure -Echo noted EF less than 20%, severely elevated PA systolic pressure, severe MR -Transferred to [facility] service 4/27 from PCCM -Kidney function improving, intermittently dosed with IV Lasix  Assessment and Plan: Acute on chronic systolic heart failure (HCC) Echocardiogram with reduced LV systolic function EF < 20%, positive regional wall motion abnormalities, akinesis of the inferior lateral and lateral walls, RV with moderate reduction in systolic function, RVSP 72.5. LA with severe dilatation, moderate to severe mitral regurgitation.   Patient has negative fluid balance, but continue to  have lower extremity edema. Urine output is not accurate.  Since admission she has lost 2 Kg.  Systolic blood pressure is 110 mmHg.   Continue with torsemide and will add one dose of metolazone today.  Limited pharmacologic options due to low blood pressure and reduced GFR.   Sepsis secondary to UTI [facility]) Septic shock, shock liver.  Enterobacter and Proteus infection.   Patient was treated with cefepime for 5 days with good toleration.   Recent gastroenteritis positive for enterotoxigenic E Coli.  CT scan with anasarca and bowel wall thickening.   Coronary artery disease due to lipid rich plaque Patient had PCI to left circumflex on 01/24.  Continue on clopidogrel and aspirin.  Acute renal failure (HCC) CKD stage 3b, hypokalemia, ATN due to shock.   Renal function with serum cr at 1,97 with K at 4,2 and serum bicarbonate at 32.  Na 137 and Mg 1,8   Plan to resume torsemide for diuresis. Add metolazone x 1 dose. Add Mg sulfate 2 g IV to prevent hypomagnesemia.   Essential hypertension Continue blood pressure monitoring.   PAF (paroxysmal atrial fibrillation) (HCC) History of VT arrest. No sp ICD implantation.  Plan to continue amiodarone. Anticoagulation with apixaban.     Subjective: Patient is feeling better, continue to have lower extremity edema, but no dyspnea.   Physical Exam: Vitals:   04/12/23 2300 04/13/23 0323 04/13/23 0636 04/13/23 0754  BP: 115/77 110/68  110/75  Pulse: 73 71  70  Resp: 18 18  16   Temp: 97.7 F (36.5 C) 97.6 F (36.4 C)  97.6 F (36.4 C)  TempSrc: Oral Oral  Oral  SpO2: 98% 98%  92%  Weight:   75.5 kg   Height:       Neurology awake and alert ENT with mild pallor Cardiovascular with S1 and S2 present and rhythmic, positive murmur at the apex,  No JVD Positive lower extremity edema ++ pitting Respiratory with no rales or wheezing, no rhonchi Abdomen with no distention  Data Reviewed:    Family Communication: no family  at the bedside.  I spoke with patient's husband over the phone we talked in detail about patient's condition, plan of care and prognosis and all questions were addressed.   Disposition: Status is: Inpatient Remains inpatient appropriate because: diuresis   Planned Discharge Destination: Home     Author: Coralie Keens, MD 04/13/2023 10:30 AM  For on call review [email or web address].

## 2023-04-14 ENCOUNTER — Telehealth: Payer: Self-pay | Admitting: Cardiology

## 2023-04-14 DIAGNOSIS — I10 Essential (primary) hypertension: Secondary | ICD-10-CM

## 2023-04-14 DIAGNOSIS — A419 Sepsis, unspecified organism: Secondary | ICD-10-CM | POA: Diagnosis not present

## 2023-04-14 DIAGNOSIS — N179 Acute kidney failure, unspecified: Secondary | ICD-10-CM | POA: Diagnosis not present

## 2023-04-14 DIAGNOSIS — I2510 Atherosclerotic heart disease of native coronary artery without angina pectoris: Secondary | ICD-10-CM | POA: Diagnosis not present

## 2023-04-14 DIAGNOSIS — I5023 Acute on chronic systolic (congestive) heart failure: Secondary | ICD-10-CM | POA: Diagnosis not present

## 2023-04-14 LAB — RENAL FUNCTION PANEL
Albumin: 3.1 g/dL — ABNORMAL LOW (ref 3.5–5.0)
Anion gap: 11 (ref 5–15)
BUN: 45 mg/dL — ABNORMAL HIGH (ref 8–23)
CO2: 30 mmol/L (ref 22–32)
Calcium: 9.3 mg/dL (ref 8.9–10.3)
Chloride: 98 mmol/L (ref 98–111)
Creatinine, Ser: 1.99 mg/dL — ABNORMAL HIGH (ref 0.44–1.00)
GFR, Estimated: 25 mL/min — ABNORMAL LOW (ref >60)
Glucose, Bld: 146 mg/dL — ABNORMAL HIGH (ref 70–99)
Phosphorus: 2.5 mg/dL (ref 2.5–4.6)
Potassium: 4.3 mmol/L (ref 3.5–5.1)
Sodium: 139 mmol/L (ref 135–145)

## 2023-04-14 LAB — GLUCOSE, CAPILLARY
Glucose-Capillary: 125 mg/dL — ABNORMAL HIGH (ref 70–99)
Glucose-Capillary: 219 mg/dL — ABNORMAL HIGH (ref 70–99)
Glucose-Capillary: 106 mg/dL — ABNORMAL HIGH (ref 70–99)
Glucose-Capillary: 137 mg/dL — ABNORMAL HIGH (ref 70–99)

## 2023-04-14 MED ORDER — FUROSEMIDE 10 MG/ML IJ SOLN
80.0000 mg | Freq: Two times a day (BID) | INTRAMUSCULAR | Status: DC
  Administered 2023-04-14 – 2023-04-16 (×5): 80 mg via INTRAVENOUS
  Filled 2023-04-14 (×5): qty 8

## 2023-04-14 NOTE — Telephone Encounter (Signed)
Spoke with pt she is still admitted at [facility]. She thinks that she will get discharged Sunday. She will call back to "discuss things" with [facility] Monday. Sharon notified.

## 2023-04-14 NOTE — Telephone Encounter (Signed)
Left message to call back. Pt is in the hospital.

## 2023-04-14 NOTE — Progress Notes (Signed)
Progress Note   Patient: Natasha Woodard ZOX:[id number] DOB: 09-Dec-1941 DOA: 04/05/2023     9 DOS: the patient was seen and examined on 04/14/2023   Brief hospital course: 81/F with prior nonischemic cardiomyopathy, EF had recovered and then admitted with lateral wall MI in 2/24, cath with distal circumflex occlusion, complicated by severe ischemic MR, low output heart failure.  Required balloon pump, subsequently removed for limb ischemia concerns, developed right femoral pseudoaneurysm. Had VT arrest 2/8, ICD placed 2/12  -Treated with prolonged course of milrinone, diuretics etc. and had multiple surgeries of right groin, debridement, wound VAC placement and change etc. subsequently went to CIR and discharged home 3/19. - Readmitted 4/19-22 with N/V/abd pain with unrevealing CT.  Found to have enterotoxigenic E. coli ; AKI Cr ~3 from recent baseline 1.3-1.6mg /dL . Improved with fluids, withholding ARB and diuretics to 2.4 at discharge.   -Return to the ED 4/24 with N/V and dyspnea-labs noted, K 6, Bicarb 11, BUN 57, Cr 4.3, AG 29, AST 804, ALT 723, BNP 2550; VBG 7.06, 38, lactate 9.   CXR mild pulm edema, due to increased WOB placed on bipap.  She was placed on bicarb drip and admitted to the ICU. -Cards and nephrology following, concern for low output failure -Has not been diuresed on account of renal failure -Echo noted EF less than 20%, severely elevated PA systolic pressure, severe MR -Transferred to [facility] service 4/27 from PCCM -Kidney function improving, intermittently dosed with IV Lasix  05/03 patient with recurrent volume overload, transitioned back to IV furosemide for diuresis.   Assessment and Plan: Acute on chronic systolic heart failure (HCC) Echocardiogram with reduced LV systolic function EF < 20%, positive regional wall motion abnormalities, akinesis of the inferior lateral and lateral walls, RV with moderate reduction in systolic function, RVSP 72.5. LA with severe dilatation,  moderate to severe mitral regurgitation.   Urine output is documented only 500 cc,  No change in weight. Despite metolazone and torsemide.  Today with persistent lower extremity edema and positive JVD.  Systolic blood pressure 108 to 112 mmHg.  Plan to resume furosemide 80 mg IV q12 hrs.   Limited pharmacologic options due to low blood pressure and reduced GFR.   Sepsis secondary to UTI [facility]) Septic shock, shock liver.  Enterobacter and Proteus infection.   Patient was treated with cefepime for 5 days with good toleration.   Recent gastroenteritis positive for enterotoxigenic E Coli.  CT scan with anasarca and bowel wall thickening.   Coronary artery disease due to lipid rich plaque Patient had PCI to left circumflex on 01/24.  Continue on clopidogrel and aspirin.  Acute renal failure (HCC) CKD stage 3b, hypokalemia, ATN due to shock.   Continue with volume overload, renal function today with serum cr at 1,99 with K at 4,3 and serum bicarbonate at 30,  Bun 45, P  2,5   Plan to resume diuresis with IV furosemide.  Follow up renal panel in am.   Essential hypertension Continue blood pressure monitoring.   PAF (paroxysmal atrial fibrillation) (HCC) History of VT arrest. No sp ICD implantation.  Plan to continue amiodarone. Anticoagulation with apixaban.          Subjective: Patient with dyspnea on exertion, continue to have lower extremity edema.  Physical Exam: Vitals:   04/13/23 2306 04/14/23 0606 04/14/23 0613 04/14/23 0817  BP: 111/73 108/66 108/66 112/60  Pulse: 70 70 68 72  Resp: 18 20  19   Temp: 97.7 F (36.5 C)  97.6 F (36.4 C)  97.6 F (36.4 C)  TempSrc: Oral Oral  Oral  SpO2: 97% 92% 93% 95%  Weight:  75.7 kg    Height:       Neurology awake and alert ENT With mild pallor Cardiovascular with S1 and S2 present and rhythmic, positive systolic murmur at the apex. Positive JVD Positive lower extremity edema ++ Respiratory with mild rales  with no wheezing Abdomen with no distention  Data Reviewed:    Family Communication: no family at the bedside   Disposition: Status is: Inpatient Remains inpatient appropriate because: IV diuresis for heart failure   Planned Discharge Destination: Home   Author: Coralie Keens, MD 04/14/2023 11:03 AM  For on call review [email or web address].

## 2023-04-14 NOTE — Progress Notes (Signed)
Occupational Therapy Treatment Patient Details Name: Natasha Woodard MRN: [id number] DOB: 07-Sep-1941 Today's Date: 04/14/2023   History of present illness 82 y/o woman who presented with recurrent abdominal symptoms with early satiety, distention, diffuse pain throughout her abdomen, nausea, and vomiting. Recent admission and was discharged on 04/03/23 with ETEC gastroenteritis. Admitted for severe metabolic acidosis, lactic acidosis, concern for decompensated heart failure PMH: NSTEMI and cardiogenic shock with a complicated and prolonged admission 1/30-3/10/24,  HFrEF, A-fib, AKI on CKD IVOSA, HTN, HLD   OT comments  Pt. Seen for skilled OT treatment session. Pt. Able to complete bed mobility with mod I.  Seated LB dressing task with set up.  Short pivotal steps to recliner min guard a.  Education on plb and energy conservation.  Pt. Very receptive to this and able to return demo for breathing strategies when feeling sob.  Agree with current d/c recommendations.     Recommendations for follow up therapy are one component of a multi-disciplinary discharge planning process, led by the attending physician.  Recommendations may be updated based on patient status, additional functional criteria and insurance authorization.    Assistance Recommended at Discharge Intermittent Supervision/Assistance  Patient can return home with the following  A little help with walking and/or transfers;A little help with bathing/dressing/bathroom;Assistance with cooking/housework;Assist for transportation;Help with stairs or ramp for entrance   Equipment Recommendations  BSC/3in1    Recommendations for Other Services      Precautions / Restrictions Precautions Precautions: Fall Precaution Comments: monitor O2 and HR       Mobility Bed Mobility Overal bed mobility: Modified Independent                  Transfers Overall transfer level: Needs assistance   Transfers: Sit to/from Stand, Bed to  chair/wheelchair/BSC Sit to Stand: Min guard     Step pivot transfers: Min guard     General transfer comment: no dme used. quick transfer eob to recliner     Balance                                           ADL either performed or assessed with clinical judgement   ADL Overall ADL's : Needs assistance/impaired                     Lower Body Dressing: Supervision/safety;Sitting/lateral leans   Toilet Transfer: Min Manufacturing systems engineer Details (indicate cue type and reason): simulated transfer observed during pivotal steps from eob to recliner         Functional mobility during ADLs: Min guard General ADL Comments: education and examples provided for energy conservation and plb strategies during adls and mobility . pt. receptive and expressed verbal apprecition when "it worked" (plb).  discussed anxiety component when a person begins to experience breathing difficulty and that the plb not only helps with the breathing but decreasing anxiety issues    Extremity/Trunk Assessment              Vision       Perception     Praxis      Cognition Arousal/Alertness: Awake/alert Behavior During Therapy: WFL for tasks assessed/performed Overall Cognitive Status: Within Functional Limits for tasks assessed  Exercises      Shoulder Instructions       General Comments      Pertinent Vitals/ Pain       Pain Assessment Pain Assessment: No/denies pain  Home Living                                          Prior Functioning/Environment              Frequency  Min 2X/week        Progress Toward Goals  OT Goals(current goals can now be found in the care plan section)  Progress towards OT goals: Progressing toward goals     Plan Discharge plan remains appropriate;Frequency remains appropriate    Co-evaluation                  AM-PAC OT "6 Clicks" Daily Activity     Outcome Measure   Help from another person eating meals?: None Help from another person taking care of personal grooming?: A Little Help from another person toileting, which includes using toliet, bedpan, or urinal?: A Little Help from another person bathing (including washing, rinsing, drying)?: A Little Help from another person to put on and taking off regular upper body clothing?: A Little Help from another person to put on and taking off regular lower body clothing?: A Little 6 Click Score: 19    End of Session    OT Visit Diagnosis: Unsteadiness on feet (R26.81);Muscle weakness (generalized) (M62.81)   Activity Tolerance Patient tolerated treatment well   Patient Left in chair;with call bell/phone within reach   Nurse Communication Other (comment) (spoke with rn, for fluid alotment for pt. requested g.ale. provided specified amount of 1/2 cup with ice per rn.  also received permission for giving some saltines as pt. reports feeling a little nauseas)        Time: 1610-9604 OT Time Calculation (min): 31 min  Charges: OT General Charges $OT Visit: 1 Visit OT Treatments $Self Care/Home Management : 23-37 mins  Boneta Lucks, COTA/L Acute Rehabilitation [phone]   Alessandra Bevels Lorraine-COTA/L 04/14/2023, 12:50 PM

## 2023-04-14 NOTE — Progress Notes (Signed)
Pt has PRN biPAP order. No distress currently and resting comfortably. RT will cont to monitor as needed.

## 2023-04-14 NOTE — Telephone Encounter (Signed)
Patient called wanting to speak to nurse [name].

## 2023-04-15 DIAGNOSIS — A419 Sepsis, unspecified organism: Secondary | ICD-10-CM | POA: Diagnosis not present

## 2023-04-15 DIAGNOSIS — I2510 Atherosclerotic heart disease of native coronary artery without angina pectoris: Secondary | ICD-10-CM | POA: Diagnosis not present

## 2023-04-15 DIAGNOSIS — I5023 Acute on chronic systolic (congestive) heart failure: Secondary | ICD-10-CM | POA: Diagnosis not present

## 2023-04-15 DIAGNOSIS — N179 Acute kidney failure, unspecified: Secondary | ICD-10-CM | POA: Diagnosis not present

## 2023-04-15 LAB — GLUCOSE, CAPILLARY
Glucose-Capillary: 109 mg/dL — ABNORMAL HIGH (ref 70–99)
Glucose-Capillary: 203 mg/dL — ABNORMAL HIGH (ref 70–99)
Glucose-Capillary: 163 mg/dL — ABNORMAL HIGH (ref 70–99)
Glucose-Capillary: 113 mg/dL — ABNORMAL HIGH (ref 70–99)

## 2023-04-15 LAB — BASIC METABOLIC PANEL
Anion gap: 13 (ref 5–15)
BUN: 43 mg/dL — ABNORMAL HIGH (ref 8–23)
CO2: 32 mmol/L (ref 22–32)
Calcium: 9.4 mg/dL (ref 8.9–10.3)
Chloride: 97 mmol/L — ABNORMAL LOW (ref 98–111)
Creatinine, Ser: 1.9 mg/dL — ABNORMAL HIGH (ref 0.44–1.00)
GFR, Estimated: 26 mL/min — ABNORMAL LOW (ref >60)
Glucose, Bld: 124 mg/dL — ABNORMAL HIGH (ref 70–99)
Potassium: 3.6 mmol/L (ref 3.5–5.1)
Sodium: 142 mmol/L (ref 135–145)

## 2023-04-15 MED ORDER — ADULT MULTIVITAMIN W/MINERALS CH
1.0000 | ORAL_TABLET | Freq: Every day | ORAL | Status: DC
  Administered 2023-04-15 – 2023-04-16 (×2): 1 via ORAL
  Filled 2023-04-15 (×2): qty 1

## 2023-04-15 NOTE — Progress Notes (Signed)
Physical Therapy Treatment Patient Details Name: Natasha Woodard MRN: [id number] DOB: October 10, 1941 Today's Date: 04/15/2023   History of Present Illness Pt is an 82 y.o. female admitted 04/05/23 with abdominal pain, distension, nausea/vomiting. Workup for severe metabolic acidosis, lactic acidosis, concern for decompensated heart failure. Of note, recent admission 04/03/23 with ETEC gastroenteritis. Other PMH includes NSTEMI with cardiogenic shock (prolonged admission 1/30-3/10/24), HFrEF, afib, CKD, HTN, HLD.   PT Comments    Pt progressing with mobility. Pt performing mobility and ADL tasks with rollator at supervision-level; limited by DOE and fatigue requiring frequent rest breaks between activity bouts. Pt preparing for likely discharge home tomorrow; reviewed all education, including energy conservation strategies and activity recommendations; pt reports no further questions or concerns. If to remain admitted, will continue to follow acutely to address established goals.    Recommendations for follow up therapy are one component of a multi-disciplinary discharge planning process, led by the attending physician.  Recommendations may be updated based on patient status, additional functional criteria and insurance authorization.  Assistance Recommended at Discharge Frequent or constant Supervision/Assistance  Patient can return home with the following A little help with bathing/dressing/bathroom;Assistance with cooking/housework;Assist for transportation;Help with stairs or ramp for entrance   Equipment Recommendations  None recommended by PT    Recommendations for Other Services       Precautions / Restrictions Precautions Precautions: Fall;Other (comment) Precaution Comments: urinary urgency Restrictions Weight Bearing Restrictions: No     Mobility  Bed Mobility               General bed mobility comments: received sitting in recliner    Transfers Overall transfer level:  Modified independent Equipment used: Rollator (4 wheels) Transfers: Sit to/from Stand             General transfer comment: mod indep sit<>stand from recliner and low toilet height with rollator    Ambulation/Gait Ambulation/Gait assistance: Supervision Gait Distance (Feet): 46 Feet Assistive device: Rollator (4 wheels) Gait Pattern/deviations: Step-through pattern, Decreased stride length, Trunk flexed Gait velocity: Decreased     General Gait Details: fast ambulation to bathroom with rollator due to urinary urgency; slow, steady gait after bathroom, supervision for safety/lines. further ambulation distance limited by fatigue and SOB after completing multiple seated/standing ADL tasks   Stairs             Wheelchair Mobility    Modified Rankin (Stroke Patients Only)       Balance Overall balance assessment: Needs assistance Sitting-balance support: No upper extremity supported, Feet supported Sitting balance-Leahy Scale: Fair Sitting balance - Comments: pt able to don/doff underwear and socks sitting in recliner that got wet due to urine incontinence, frequent rest breaks during sitting activity secondary to SOB   Standing balance support: No upper extremity supported Standing balance-Leahy Scale: Fair Standing balance comment: can static stand to pull up underwear without UE support; static and dynamic stability improved with rollator                            Cognition Arousal/Alertness: Awake/alert Behavior During Therapy: WFL for tasks assessed/performed Overall Cognitive Status: Within Functional Limits for tasks assessed                                 General Comments: WFL for simple tasks, not formally assessed        Exercises  General Comments General comments (skin integrity, edema, etc.): pt preparing for discharge home tomorrow. reviewed educ re: activity recommendations, energy conservation strategies with ADL  tasks (handout provided). SpO2 98% on RA with mobility, HR 77      Pertinent Vitals/Pain Pain Assessment Pain Assessment: No/denies pain Pain Intervention(s): Monitored during session    Home Living                          Prior Function            PT Goals (current goals can now be found in the care plan section) Progress towards PT goals: Progressing toward goals    Frequency    Min 1X/week      PT Plan Current plan remains appropriate    Co-evaluation              AM-PAC PT "6 Clicks" Mobility   Outcome Measure  Help needed turning from your back to your side while in a flat bed without using bedrails?: None Help needed moving from lying on your back to sitting on the side of a flat bed without using bedrails?: None Help needed moving to and from a bed to a chair (including a wheelchair)?: None Help needed standing up from a chair using your arms (e.g., wheelchair or bedside chair)?: None Help needed to walk in hospital room?: A Little Help needed climbing 3-5 steps with a railing? : A Lot 6 Click Score: 21    End of Session   Activity Tolerance: Patient tolerated treatment well;Patient limited by fatigue Patient left: in chair;with call bell/phone within reach Nurse Communication: Mobility status PT Visit Diagnosis: Muscle weakness (generalized) (M62.81)     Time: 8469-6295 PT Time Calculation (min) (ACUTE ONLY): 18 min  Charges:  $Therapeutic Activity: 8-22 mins                     Ina Homes, PT, DPT Acute Rehabilitation Services  Personal: Secure Chat Rehab Office: [phone]  Malachy Chamber 04/15/2023, 5:10 PM

## 2023-04-15 NOTE — Progress Notes (Signed)
Progress Note   Patient: Natasha Woodard ZOX:[id number] DOB: 03/30/1941 DOA: 04/05/2023     10 DOS: the patient was seen and examined on 04/15/2023   Brief hospital course: 81/F with prior nonischemic cardiomyopathy, EF had recovered and then admitted with lateral wall MI in 2/24, cath with distal circumflex occlusion, complicated by severe ischemic MR, low output heart failure.  Required balloon pump, subsequently removed for limb ischemia concerns, developed right femoral pseudoaneurysm. Had VT arrest 2/8, ICD placed 2/12  -Treated with prolonged course of milrinone, diuretics etc. and had multiple surgeries of right groin, debridement, wound VAC placement and change etc. subsequently went to CIR and discharged home 3/19. - Readmitted 4/19-22 with N/V/abd pain with unrevealing CT.  Found to have enterotoxigenic E. coli ; AKI Cr ~3 from recent baseline 1.3-1.6mg /dL . Improved with fluids, withholding ARB and diuretics to 2.4 at discharge.   -Return to the ED 4/24 with N/V and dyspnea-labs noted, K 6, Bicarb 11, BUN 57, Cr 4.3, AG 29, AST 804, ALT 723, BNP 2550; VBG 7.06, 38, lactate 9.   CXR mild pulm edema, due to increased WOB placed on bipap.  She was placed on bicarb drip and admitted to the ICU. -Cards and nephrology following, concern for low output failure -Has not been diuresed on account of renal failure -Echo noted EF less than 20%, severely elevated PA systolic pressure, severe MR -Transferred to [facility] service 4/27 from PCCM -Kidney function improving, intermittently dosed with IV Lasix  05/03 patient with recurrent volume overload, transitioned back to IV furosemide for diuresis.  05/04 improving volume status plan for possible discharge tomorrow.   Assessment and Plan: Acute on chronic systolic heart failure (HCC) Echocardiogram with reduced LV systolic function EF < 20%, positive regional wall motion abnormalities, akinesis of the inferior lateral and lateral walls, RV with moderate  reduction in systolic function, RVSP 72.5. LA with severe dilatation, moderate to severe mitral regurgitation.   Urine output not documented.  Improved volume status.   Systolic blood pressure 120 to 114  mmHg.  Plan to resume furosemide 80 mg IV q12 hrs.   Limited pharmacologic options due to low blood pressure and reduced GFR.   Sepsis secondary to UTI [facility]) Septic shock, shock liver.  Enterobacter and Proteus infection.   Patient was treated with cefepime for 5 days with good toleration.   Recent gastroenteritis positive for enterotoxigenic E Coli.  CT scan with anasarca and bowel wall thickening.   Coronary artery disease due to lipid rich plaque Patient had PCI to left circumflex on 01/24.  Continue on clopidogrel and aspirin.  Acute renal failure (HCC) CKD stage 3b, hypokalemia, ATN due to shock.   Renal function stable with serum cr at 1,90 with K at 3,6 and serum bicarbonate at 32.   Plan to resume diuresis with IV furosemide.  Follow up renal panel in am.   Essential hypertension Continue blood pressure monitoring.   PAF (paroxysmal atrial fibrillation) (HCC) History of VT arrest. No sp ICD implantation.  Plan to continue amiodarone. Anticoagulation with apixaban.          Subjective: Patient with improvement in dyspnea and edema, no chest pain,  Physical Exam: Vitals:   04/15/23 0357 04/15/23 0624 04/15/23 0808 04/15/23 1132  BP: 117/71  114/71 121/77  Pulse: 71 70 70   Resp: (!) 21 16 17    Temp: 98.2 F (36.8 C)  97.6 F (36.4 C) 98 F (36.7 C)  TempSrc: Oral  Oral Oral  SpO2: 100% 99% 97%   Weight:  80.9 kg    Height:       Neurology awake and alert ENT with mild pallor Cardiovascular with S1 and S2 present and rhythmic with no gallops, rubs or murmurs Respiratory with no rales or wheezing Abdomen with no distention  Trace non pitting lower extremity edema  Data Reviewed:    Family Communication: no family at the bedside    Disposition: Status is: Inpatient Remains inpatient appropriate because: heart failure   Planned Discharge Destination: Home     Author: Coralie Keens, MD 04/15/2023 2:22 PM  For on call review [email or web address].

## 2023-04-16 DIAGNOSIS — A419 Sepsis, unspecified organism: Secondary | ICD-10-CM | POA: Diagnosis not present

## 2023-04-16 DIAGNOSIS — I5023 Acute on chronic systolic (congestive) heart failure: Secondary | ICD-10-CM | POA: Diagnosis not present

## 2023-04-16 DIAGNOSIS — N179 Acute kidney failure, unspecified: Secondary | ICD-10-CM | POA: Diagnosis not present

## 2023-04-16 DIAGNOSIS — I2510 Atherosclerotic heart disease of native coronary artery without angina pectoris: Secondary | ICD-10-CM | POA: Diagnosis not present

## 2023-04-16 LAB — BASIC METABOLIC PANEL
Anion gap: 9 (ref 5–15)
BUN: 44 mg/dL — ABNORMAL HIGH (ref 8–23)
CO2: 30 mmol/L (ref 22–32)
Calcium: 9.4 mg/dL (ref 8.9–10.3)
Chloride: 101 mmol/L (ref 98–111)
Creatinine, Ser: 1.83 mg/dL — ABNORMAL HIGH (ref 0.44–1.00)
GFR, Estimated: 27 mL/min — ABNORMAL LOW (ref >60)
Glucose, Bld: 109 mg/dL — ABNORMAL HIGH (ref 70–99)
Potassium: 4.1 mmol/L (ref 3.5–5.1)
Sodium: 140 mmol/L (ref 135–145)

## 2023-04-16 LAB — GLUCOSE, CAPILLARY
Glucose-Capillary: 103 mg/dL — ABNORMAL HIGH (ref 70–99)
Glucose-Capillary: 167 mg/dL — ABNORMAL HIGH (ref 70–99)

## 2023-04-16 LAB — MAGNESIUM: Magnesium: 1.8 mg/dL (ref 1.7–2.4)

## 2023-04-16 MED ORDER — GLUCERNA SHAKE PO LIQD: 237.0000 mL | Freq: Two times a day (BID) | ORAL | 0 refills | Status: DC

## 2023-04-16 MED ORDER — MAGNESIUM SULFATE 2 GM/50ML IV SOLN
2.0000 g | Freq: Once | INTRAVENOUS | Status: AC
  Administered 2023-04-16: 2 g via INTRAVENOUS
  Filled 2023-04-16: qty 50

## 2023-04-16 MED ORDER — TORSEMIDE 20 MG PO TABS: 40.0000 mg | ORAL_TABLET | Freq: Every day | ORAL | 0 refills | Status: DC

## 2023-04-16 MED ORDER — TORSEMIDE 20 MG PO TABS: 40.0000 mg | ORAL_TABLET | Freq: Every day | ORAL | Status: DC

## 2023-04-16 NOTE — TOC Transition Note (Signed)
Transition of Care [facility]) - CM/SW Discharge Note   Patient Details  Name: Natasha Woodard MRN: [id number] Date of Birth: May 13, 1941  Transition of Care [facility]) CM/SW Contact:  Dianna Limbo [place], [place] Phone Number: [phone] 04/16/2023, 2:36 PM   Clinical Narrative:  Call attempt to speak with patient regarding discharge plan. No answer. Call to husband via contact number listed in chart. No answer. Discharge plan calls for home oxygen. Ordered oxygen via [employer]. Received a call from [facility] patient is refusing oxygen. Contacted Nurse- Marcelino Duster to talk with patient about oxygen. Notified Dr. Ella Jubilee about patient's refusal. MD OK with discharge without oxygen due to patient's refusal. No further needs identified.    Final next level of care: Home/Self Care Barriers to Discharge: No Barriers Identified   Patient Goals and CMS Choice CMS Medicare.gov Compare Post Acute Care list provided to:: Patient Choice offered to / list presented to : Patient  Discharge Placement                         Discharge Plan and Services Additional resources added to the After Visit Summary for       Post Acute Care Choice: Home Health, Resumption of Svcs/PTA Provider          DME Arranged: Oxygen DME Agency: [employer] Date DME Agency Contacted: 04/16/23 Time DME Agency Contacted: 1100 Representative spoke with at DME Agency: Vaughan Basta- [phone]            Social Determinants of Health (SDOH) Interventions SDOH Screenings   Food Insecurity: No Food Insecurity (04/06/2023)  Housing: Low Risk  (04/06/2023)  Transportation Needs: No Transportation Needs (04/06/2023)  Utilities: Not At Risk (04/06/2023)  Depression (PHQ2-9): Medium Risk (03/28/2023)  Financial Resource Strain: Low Risk  (03/27/2023)  Physical Activity: Sufficiently Active (03/27/2023)  Social Connections: Unknown (03/27/2023)  Stress: No Stress Concern Present (03/27/2023)  Tobacco Use: Medium  Risk (04/05/2023)     Readmission Risk Interventions    04/03/2023   10:10 AM  Readmission Risk Prevention Plan  Transportation Screening Complete  PCP or Specialist Appt within 5-7 Days Complete  Home Care Screening Complete  Medication Review (RN CM) Referral to Pharmacy

## 2023-04-16 NOTE — Discharge Summary (Signed)
Physician Discharge Summary   Patient: Natasha Woodard MRN: [id number] DOB: [birth year]  Admit date:     04/05/2023  Discharge date: 04/16/23  Discharge Physician: York Ram Janay Canan   PCP: [name], MD   Recommendations at discharge:    Patient will continue diuresis with torsemide 40 mg daily.  Close follow up as outpatient, not on RAAS inhibition due to risk of hypotension, no B blocker due to low output heart failure, and not on SGLT 2 ing due to recent groin infection.  Follow up renal function and electrolytes within 7 days.  Follow up with primary care in 7 to 10 days.  Resume home health.  Will check ambulatory 02 on room air prior to discharge, she may need supplemental 02 for home use.    Discharge Diagnoses: Active Problems:   Acute on chronic systolic heart failure (HCC)   Sepsis secondary to UTI [facility])   Coronary artery disease due to lipid rich plaque   Acute renal failure (HCC)   Essential hypertension   PAF (paroxysmal atrial fibrillation) (HCC)  Resolved Problems:   * No resolved hospital problems. [facility] Course: [name] was admitted to the hospital with the working diagnosis of heart failure exacerbation in the setting of sepsis.   82/F with prior nonischemic cardiomyopathy, EF had recovered and then admitted with lateral wall MI in 2/24, cath with distal circumflex occlusion, complicated by severe ischemic MR, low output heart failure.  Required balloon pump, subsequently removed for limb ischemia concerns, developed right femoral pseudoaneurysm. Had VT arrest 2/8, ICD placed 2/12  -Treated with prolonged course of milrinone, diuretics etc. and had multiple surgeries of right groin, debridement, wound VAC placement and change etc. subsequently went to CIR and discharged home 3/19.  Readmitted 4/19-22 with N/V/abd pain with unrevealing CT.  Found to have enterotoxigenic E. coli ; AKI Cr ~3 from recent baseline 1.3-1.6mg /dL . Improved with  fluids, withholding ARB and diuretics at discharge.    Patient at home developed recurrent nausea, vomiting and abdominal pain. Positive worsening dyspnea, and abdominal pain that prompted her to come back to the hospital. On her initial physical examination she was in respiratory distress and was placed on non invasive mechanical ventilation. Blood pressure was 122/69, HR 70, RR 23 and 02 saturation 100%, ill looking appearing, heart with S1 and S2 present and tachycardic, lungs with rales bilaterally, abdomen with generalized tenderness, and positive lower extremity edema.  VBG 7,06/ 39.5/ 44/ 12/ 61  NA 139, K 6.0 Cl 99, bicarbonate 11, glucose 62, bun 57, cr 4,29. Anion gap 29.  AST 804, ALT 723  BNP 2,550 High sensitive troponin 75 and 76  Lactic acid > 9.0  Wbc 6,5 hgb 13.7 plt 251   Urine analysis SG 1,025, protein 30, glucose negative, large hgb and small leukocytes,  Urinary Na 52.   Chest radiograph with cardiomegaly and bilateral hilar vascular congestion with bilateral central interstitial infiltrates. Positive fluid in the right fissure. Pacemaker defibrillator in place with one atrial and on right ventricular lead.   CT abdomen and pelvis with sigmoid diverticulosis, possible mild diverticulitis, anasarca, hyperdense material in the gallbladder, possible stone, sludge, or excreted contrast.   EKG 70 bpm, right axis deviation, qtc 570, prolonged pr, atrial pacing, with ventricular pacing, no significant ST segment or T wave changes.   She was placed on bicarb drip and admitted to the ICU. Right subclavian central line was placed.  -Cards and nephrology following, concern for low  output failure -Has not been diuresed on account of renal failure -Echo noted EF less than 20%, severely elevated PA systolic pressure, severe MR  -Transferred to [facility] service 4/27 from [facility] -Kidney function improving, intermittently dosed with IV Lasix  05/03 patient with recurrent volume overload,  transitioned back to IV furosemide for diuresis.  05/04 improving volume status plan for possible discharge tomorrow.  05/05 improved volume status, continue loop diuretic therapy at home and have close follow up as outpatient.   Assessment and Plan: Acute on chronic systolic heart failure (HCC) Echocardiogram with reduced LV systolic function EF < 20%, positive regional wall motion abnormalities, akinesis of the inferior lateral and lateral walls, RV with moderate reduction in systolic function, RVSP 72.5. LA with severe dilatation, moderate to severe mitral regurgitation.   Patient was placed on IV furosemide and intermittent metolazone for diuresis, negative fluid balance was achieved with significant improvement in her symptoms.  Unfortunately not accurate weight to document weight loss.  Systolic blood pressure 115 to 98 mmHg.   Limited pharmacologic options due to low blood pressure, reduced GFR and low cardiac output heart failure.    Patient will continue diuresis with oral torsemide 40 mg po daily.  No SGLT 2 inh due to history of groin infection.  Follow up as outpatient with cardiology.   Troponin elevation due to heart failure, acute coronary syndrome has been ruled out.  Continue with clopidogrel, no aspirin due to the need for apixaban.   Sepsis secondary to UTI [facility]) Septic shock, shock liver.  Enterobacter and Proteus infection.   Patient was treated with cefepime for 5 days with good toleration.   Recent gastroenteritis positive for enterotoxigenic E Coli.  CT scan with anasarca and bowel wall thickening.   Coronary artery disease due to lipid rich plaque Patient had PCI to left circumflex on 01/24.  Continue on clopidogrel  Acute renal failure (HCC) CKD stage 3b, hypokalemia, ATN due to shock.  Severe anion gap metabolic acidosis, lactic acidosis.   Her volume status has improved, at the time of her discharge her serum cr is 1,83 with K at 4,1 and serum  bicarbonate at 30. Na 140 and Mg 1,8   Plan to continue diuresis with oral torsemide 40 mg po daily.  Add 2 g mag sulfate prior to her discharge. Follow up renal function and electrolytes as outpatient.   Essential hypertension Continue blood pressure monitoring.  No RAAS inhibition due to low GFR and risk of hypotension. No B blocker due to low output heart failure.  Follow up as outpatient.   PAF (paroxysmal atrial fibrillation) (HCC) History of VT arrest. No sp ICD implantation.  Plan to continue amiodarone. Anticoagulation with apixaban.           Consultants: cardiology, nephrology and critical care Procedures performed: right subclavian central line   Disposition: Home Diet recommendation:  Discharge Diet Orders (From admission, onward)     Start     Ordered   04/16/23 0000  Diet - low sodium heart healthy        04/16/23 1059           Cardiac diet DISCHARGE MEDICATION: Allergies as of 04/16/2023       Reactions   Chlorhexidine Gluconate Rash   Pt refuses CHG due to inflammation and rash   Ezetimibe-simvastatin Other (See Comments)   REACTION: Muscle aches (side effect)   Lipitor [atorvastatin] Other (See Comments)   Leg weakness    Claritin [loratadine]  Possible cause of nightmares.     Spironolactone    Caution re: elevated potassium/creatinine   Tamiflu [oseltamivir Phosphate] Other (See Comments)   nightmares   Pravastatin Sodium Other (See Comments)   REACTION: Muscle aches (side effect)   Sulfonamide Derivatives Nausea And Vomiting        Medication List     TAKE these medications    Accu-Chek Aviva Plus test strip Generic drug: glucose blood USE TO TEST BLOOD SUGAR ONCE DAILY   acetaminophen 325 MG tablet Commonly known as: Tylenol Take 1-2 tablets (325-650 mg total) by mouth every 6 (six) hours as needed. What changed: reasons to take this   amiodarone 200 MG tablet Commonly known as: PACERONE Take 1 tablet (200 mg  total) by mouth daily.   apixaban 2.5 MG Tabs tablet Commonly known as: ELIQUIS Take 1 tablet (2.5 mg total) by mouth 2 (two) times daily.   clopidogrel 75 MG tablet Commonly known as: PLAVIX Take 1 tablet (75 mg total) by mouth daily.   feeding supplement (GLUCERNA SHAKE) Liqd Take 237 mLs by mouth 2 (two) times daily between meals.   melatonin 5 MG Tabs Take 5 mg by mouth at bedtime as needed.   multivitamin with minerals Tabs tablet Take 1 tablet by mouth daily.   ondansetron 4 MG disintegrating tablet Commonly known as: ZOFRAN-ODT Take 1 tablet (4 mg total) by mouth every 8 (eight) hours as needed for nausea or vomiting.   rosuvastatin 10 MG tablet Commonly known as: CRESTOR Take 1 tablet (10 mg total) by mouth daily. TAKE 1 TABLET(10 MG) BY MOUTH DAILY Strength: 10 mg   torsemide 20 MG tablet Commonly known as: DEMADEX Take 2 tablets (40 mg total) by mouth daily. Start taking on: Apr 17, 2023 What changed: how much to take   traMADol 50 MG tablet Commonly known as: ULTRAM Take 1 tablet (50 mg total) by mouth 2 (two) times daily.        Discharge Exam:  BP 115/71 (BP Location: Left Arm)   Pulse 70   Temp 97.9 F (36.6 C) (Oral)   Resp 16   Ht 5\' 2"  (1.575 m)   Wt 80.9 kg   SpO2 91%   BMI 32.62 kg/m   Patient with no chest pain, no dyspnea, pnd or orthopnea  Neurology awake and alert ENT with mild pallor Cardiovascular with S1 and S2 present and rhythmic, positive systolic murmur at the apex. No JVD Trace lower extremity edema, ted hose in place.  Respiratory with no rales or wheezing Abdomen with no distention   Condition at discharge: stable  The results of significant diagnostics from this hospitalization (including imaging, microbiology, ancillary and laboratory) are listed below for reference.   Imaging Studies: DG CHEST PORT 1 VIEW  Result Date: 04/08/2023 CLINICAL DATA:  Check central line placement EXAM: PORTABLE CHEST 1 VIEW  COMPARISON:  04/07/2023 FINDINGS: Cardiac shadow is enlarged but stable. Defibrillator is again noted and stable. Right jugular central line is noted with catheter tip at the cavoatrial junction. Pneumothorax is seen. No focal infiltrate or effusion is noted. No bony abnormality is noted. IMPRESSION: No pneumothorax following central line placement. No other focal abnormality is noted. Electronically Signed   By: Alcide Clever M.D.   On: 04/08/2023 19:52   DG CHEST PORT 1 VIEW  Result Date: 04/07/2023 CLINICAL DATA:  Abnormal respiration. EXAM: PORTABLE CHEST 1 VIEW COMPARISON:  04/05/2023 FINDINGS: The cardio pericardial silhouette is enlarged. There is pulmonary vascular congestion  without overt pulmonary edema. Left pacer/AICD again noted. Right IJ central line tip overlies the SVC/RA junction. IMPRESSION: Enlargement of the cardiopericardial silhouette with pulmonary vascular congestion. Electronically Signed   By: Kennith Center M.D.   On: 04/07/2023 06:10   US Abdomen Limited RUQ (LIVER/GB)  Result Date: 04/06/2023 CLINICAL DATA:  Acute cholecystitis. EXAM: ULTRASOUND ABDOMEN LIMITED RIGHT UPPER QUADRANT COMPARISON:  None Available. FINDINGS: Gallbladder: No gallstones or wall thickening visualized. No sonographic Murphy sign noted by sonographer. Common bile duct: Diameter: 3.5 mm Liver: Within normal limits in parenchymal echogenicity. 0.3 x 1.3 x 1.8 cm hypoechoic mass in the subcapsular left lobe of the liver with appearance of a cyst. No internal blood flow. Portal vein is patent on color Doppler imaging with normal direction of blood flow towards the liver. Other: Right pleural effusion. IMPRESSION: Right pleural effusion. No evidence of acute cholecystitis or cholelithiasis. 1.8 cm benign-appearing subcapsular cyst in the left lobe of the liver. Electronically Signed   By: Ted Mcalpine M.D.   On: 04/06/2023 15:55   ECHOCARDIOGRAM COMPLETE  Result Date: 04/06/2023    ECHOCARDIOGRAM  REPORT   Patient Name:   CRYSTL LILLARD Date of Exam: 04/06/2023 Medical Rec #:  [id number]         Height:       62.0 in Accession #:    [id number]        Weight:       155.4 lb Date of Birth:  1941-04-03         BSA:          1.717 m Patient Age:    81 years          BP:           118/76 mmHg Patient Gender: F                 HR:           71 bpm. Exam Location:  Inpatient Procedure: 2D Echo, Cardiac Doppler and Color Doppler Indications:    CHF  History:        Patient has prior history of Echocardiogram examinations, most                 recent 01/18/2023. CHF, Previous Myocardial Infarction, Mitral                 Valve Disease; Risk Factors:Diabetes, Dyslipidemia and                 Hypertension.  Sonographer:    Milbert Coulter Referring Phys: [id number] LAURA P CLARK IMPRESSIONS  1. Left ventricular ejection fraction, by estimation, is <20%. The left ventricle has severely decreased function. The left ventricle demonstrate severe global hypokinesis and regional wall motion abnormalities (see scoring diagram/findings for description). The left ventricular internal cavity size was mildly dilated. Left ventricular diastolic parameters are consistent with Grade III diastolic dysfunction (restrictive). Elevated left ventricular end-diastolic pressure. There is akinesis of the left ventricular, mid-apical lateral wall and inferolateral wall. There is akinesis of the left ventricular, apical segment.  2. Right ventricular systolic function is moderately reduced. The right ventricular size is normal. There is severely elevated pulmonary artery systolic pressure. The estimated right ventricular systolic pressure is 72.5 mmHg.  3. Left atrial size was severely dilated.  4. Right atrial size was mildly dilated.  5. Based on eccentricity of MR and akinesis of the inferolateral and lateral walls, suspect this is ischemic MR due to restricted posterior  mitral valve leaflet.. The mitral valve is abnormal. Moderate to severe  mitral valve regurgitation. No evidence of mitral stenosis.  6. The aortic valve is tricuspid. Aortic valve regurgitation is trivial. Aortic valve sclerosis/calcification is present, without any evidence of aortic stenosis. Aortic valve area, by VTI measures 1.81 cm. Aortic valve mean gradient measures 3.0 mmHg.  Aortic valve Vmax measures 1.15 m/s.  7. The inferior vena cava is dilated in size with <50% respiratory variability, suggesting right atrial pressure of 15 mmHg. FINDINGS  Left Ventricle: Left ventricular ejection fraction, by estimation, is <20%. The left ventricle has severely decreased function. The left ventricle demonstrates regional wall motion abnormalities. The left ventricular internal cavity size was mildly dilated. There is no left ventricular hypertrophy. Left ventricular diastolic parameters are consistent with Grade III diastolic dysfunction (restrictive). Elevated left ventricular end-diastolic pressure. Right Ventricle: The right ventricular size is normal. No increase in right ventricular wall thickness. Right ventricular systolic function is moderately reduced. There is severely elevated pulmonary artery systolic pressure. The tricuspid regurgitant velocity is 3.79 m/s, and with an assumed right atrial pressure of 15 mmHg, the estimated right ventricular systolic pressure is 72.5 mmHg. Left Atrium: Left atrial size was severely dilated. Right Atrium: Right atrial size was mildly dilated. Pericardium: There is no evidence of pericardial effusion. Mitral Valve: Based on eccentricity of MR and akinesis of the inferolateral and lateral walls, suspect this is ischemic MR due to restricted posterior mitral valve leaflet. The mitral valve is abnormal. Moderate to severe mitral valve regurgitation, with  eccentric posteriorly directed jet. No evidence of mitral valve stenosis. Tricuspid Valve: The tricuspid valve is normal in structure. Tricuspid valve regurgitation is mild . No evidence of  tricuspid stenosis. Aortic Valve: The aortic valve is tricuspid. Aortic valve regurgitation is trivial. Aortic valve sclerosis/calcification is present, without any evidence of aortic stenosis. Aortic valve mean gradient measures 3.0 mmHg. Aortic valve peak gradient measures 5.3 mmHg. Aortic valve area, by VTI measures 1.81 cm. Pulmonic Valve: The pulmonic valve was normal in structure. Pulmonic valve regurgitation is not visualized. No evidence of pulmonic stenosis. Aorta: The aortic root is normal in size and structure. Venous: The inferior vena cava is dilated in size with less than 50% respiratory variability, suggesting right atrial pressure of 15 mmHg. IAS/Shunts: No atrial level shunt detected by color flow Doppler. Additional Comments: A device lead is visualized.  LEFT VENTRICLE PLAX 2D LVIDd:         5.60 cm      Diastology LVIDs:         5.30 cm      LV e' medial:    4.24 cm/s LV PW:         0.90 cm      LV E/e' medial:  34.0 LV IVS:        1.00 cm      LV e' lateral:   8.49 cm/s LVOT diam:     2.00 cm      LV E/e' lateral: 17.0 LV SV:         38 LV SV Index:   22 LVOT Area:     3.14 cm  LV Volumes (MOD) LV vol d, MOD A2C: 141.0 ml LV vol d, MOD A4C: 115.0 ml LV vol s, MOD A2C: 97.1 ml LV vol s, MOD A4C: 110.0 ml LV SV MOD A2C:     43.9 ml LV SV MOD A4C:     115.0 ml LV SV MOD BP:  22.3 ml RIGHT VENTRICLE RV Basal diam:  3.90 cm RV Mid diam:    3.10 cm RV S prime:     9.36 cm/s TAPSE (M-mode): 1.9 cm LEFT ATRIUM              Index        RIGHT ATRIUM           Index LA diam:        5.30 cm  3.09 cm/m   RA Area:     19.60 cm LA Vol (A2C):   118.0 ml 68.71 ml/m  RA Volume:   50.80 ml  29.58 ml/m LA Vol (A4C):   109.0 ml 63.47 ml/m LA Biplane Vol: 114.0 ml 66.38 ml/m  AORTIC VALVE AV Area (Vmax):    2.33 cm AV Area (Vmean):   2.09 cm AV Area (VTI):     1.81 cm AV Vmax:           115.00 cm/s AV Vmean:          75.600 cm/s AV VTI:            0.212 m AV Peak Grad:      5.3 mmHg AV Mean Grad:       3.0 mmHg LVOT Vmax:         85.40 cm/s LVOT Vmean:        50.300 cm/s LVOT VTI:          0.122 m LVOT/AV VTI ratio: 0.58 MITRAL VALVE                  TRICUSPID VALVE MV Area (PHT): 5.02 cm       TR Peak grad:   57.5 mmHg MV Decel Time: 151 msec       TR Vmax:        379.00 cm/s MR Peak grad:    131.3 mmHg MR Mean grad:    86.0 mmHg    SHUNTS MR Vmax:         573.00 cm/s  Systemic VTI:  0.12 m MR Vmean:        444.0 cm/s   Systemic Diam: 2.00 cm MR PISA:         4.02 cm MR PISA Eff ROA: 16 mm MR PISA Radius:  0.80 cm MV E velocity: 144.00 cm/s MV A velocity: 50.80 cm/s MV E/A ratio:  2.83 Armanda Magic MD Electronically signed by Armanda Magic MD Signature Date/Time: 04/06/2023/3:50:45 PM    Final    CT ABDOMEN PELVIS WO CONTRAST  Result Date: 04/06/2023 CLINICAL DATA:  Sepsis, known E coli gastroenteritis, lactic acidosis, concern for heart failure. EXAM: CT ABDOMEN AND PELVIS WITHOUT CONTRAST TECHNIQUE: Multidetector CT imaging of the abdomen and pelvis was performed following the standard protocol without IV contrast. RADIATION DOSE REDUCTION: This exam was performed according to the departmental dose-optimization program which includes automated exposure control, adjustment of the mA and/or kV according to patient size and/or use of iterative reconstruction technique. COMPARISON:  03/30/2023. FINDINGS: Lower chest: Heart is enlarged and pacemaker leads are present in the heart. Coronary artery calcifications are present. There is a small right pleural effusion with atelectasis at the lung bases. Hepatobiliary: A stable 1.4 cm hypodensity is noted in the left lobe of the liver, previously characterized of the cyst. No biliary ductal dilatation. Hyperdense material is present in the gallbladder, possible stones, sludge, or excreted contrast Pancreas: Unremarkable. No pancreatic ductal dilatation or surrounding inflammatory changes. Spleen: Normal in size without  focal abnormality. Adrenals/Urinary Tract: There  is a stable 2.5 cm left adrenal nodule, previously described as probable myelolipoma and not well evaluated on this exam. The right adrenal gland is within normal limits. No renal calculus or hydronephrosis. The bladder is unremarkable. Stomach/Bowel: Stomach is within normal limits. Appendix appears normal. No bowel obstruction, free air or pneumatosis. Scattered diverticula are present along the colon with mild thickening of the walls of the sigmoid colon. Vascular/Lymphatic: Aortic atherosclerosis. No enlarged abdominal or pelvic lymph nodes. Reproductive: Status post hysterectomy. No adnexal masses. Other: Mesenteric fat stranding is noted with a small amount of free fluid in the presacral space and pelvis. Anasarca is noted. A fat containing hernia is noted in the mid anterior abdomen superior to the umbilicus. Musculoskeletal: Degenerative changes are present in the thoracolumbar spine. No acute osseous abnormality. IMPRESSION: 1. Sigmoid diverticulosis with bowel wall thickening, possible mild diverticulitis. 2. Small right pleural effusion with atelectasis at the lung bases. 3. Anasarca. 4. Small amount of free fluid in the pelvis. 5. Aortic atherosclerosis and coronary artery calcifications. 6. Hyperdense material in the gallbladder, possible stones, sludge, or excreted contrast. 7. Remaining incidental findings as described above. Electronically Signed   By: Thornell Sartorius M.D.   On: 04/06/2023 00:56   DG Chest Port 1 View  Result Date: 04/05/2023 CLINICAL DATA:  Central line adjustment EXAM: PORTABLE CHEST 1 VIEW COMPARISON:  04/05/2023 FINDINGS: Right central line in place with the tip remaining in the right atrium, approximately 4 cm deep to the cavoatrial junction. Left AICD is unchanged. Cardiomegaly with vascular congestion improving bibasilar opacities. IMPRESSION: Right central line tip remains in the mid right atrium approximately 4 cm deep to the cavoatrial junction. Cardiomegaly, vascular  congestion, improving bibasilar opacities. Electronically Signed   By: Charlett Nose M.D.   On: 04/05/2023 22:23   DG Chest Portable 1 View  Result Date: 04/05/2023 CLINICAL DATA:  Central line placement EXAM: PORTABLE CHEST 1 VIEW COMPARISON:  04/05/2023 FINDINGS: Right central line is been placed with the tip in the right atrium. Left AICD remains in place, unchanged. Cardiomegaly, vascular congestion. Bibasilar opacities could reflect atelectasis or edema. No effusions or acute bony abnormality. IMPRESSION: Right central line tip in the right atrium.  No pneumothorax. Cardiomegaly, vascular congestion bibasilar atelectasis or edema. Electronically Signed   By: Charlett Nose M.D.   On: 04/05/2023 22:22   US Renal  Result Date: 04/05/2023 CLINICAL DATA:  Renal failure. EXAM: RENAL / URINARY TRACT ULTRASOUND COMPLETE COMPARISON:  None Available. FINDINGS: Right Kidney: Renal measurements: 10.2 cm x 4.2 cm x 3.6 cm = volume: 80.88 mL. Echogenicity within normal limits. No mass or hydronephrosis visualized. Left Kidney: Renal measurements: 9.7 cm x 5.6 cm x 5.0 cm = volume: 141.77 mL. Echogenicity within normal limits. No mass or hydronephrosis visualized. Bladder: Empty and subsequently limited in evaluation. Other: Of incidental note is the presence of a right pleural effusion. IMPRESSION: 1. Unremarkable ultrasonographic appearance of the kidneys. 2. Right pleural effusion. Electronically Signed   By: Aram Candela M.D.   On: 04/05/2023 20:12   DG Chest Port 1 View  Result Date: 04/05/2023 CLINICAL DATA:  Abdominal pain, nausea and vomiting, tachypnea EXAM: PORTABLE CHEST 1 VIEW COMPARISON:  01/24/2023 FINDINGS: Single frontal view of the chest demonstrates an enlarged cardiac silhouette. Stable dual lead pacer/AICD. Increasing central vascular congestion, with bilateral perihilar airspace disease. Trace bilateral pleural effusions. No pneumothorax. IMPRESSION: 1. Constellation of findings favoring  congestive heart failure and mild pulmonary  edema. Superimposed infection cannot be excluded. Electronically Signed   By: Sharlet Salina M.D.   On: 04/05/2023 19:44   VAS [place] LOWER EXTREMITY VENOUS (DVT)  Result Date: 04/01/2023  Lower Venous DVT Study Patient Name:  SIANNA CHIDESTER  Date of Exam:   04/01/2023 Medical Rec #: [id number]          Accession #:    [id number] Date of Birth: March 24, 1941          Patient Gender: F Patient Age:   55 years Exam [facility]  [facility] Procedure:      VAS [place] LOWER EXTREMITY VENOUS (DVT) Referring Phys: Junious Silk --------------------------------------------------------------------------------  Indications: Edema.  Limitations: Wound vac right groin. Comparison Study: No prior study on file Performing Technologist: Sherren Kerns RVS  Examination Guidelines: A complete evaluation includes B-mode imaging, spectral Doppler, color Doppler, and power Doppler as needed of all accessible portions of each vessel. Bilateral testing is considered an integral part of a complete examination. Limited examinations for reoccurring indications may be performed as noted. The reflux portion of the exam is performed with the patient in reverse Trendelenburg.  +---------+---------------+---------+-----------+----------+-------------------+ RIGHT    CompressibilityPhasicitySpontaneityPropertiesThrombus Aging      +---------+---------------+---------+-----------+----------+-------------------+ CFV                                                   Not well visualized +---------+---------------+---------+-----------+----------+-------------------+ SFJ                                                   Not well visualized +---------+---------------+---------+-----------+----------+-------------------+ FV Prox  Full           No       No                                       +---------+---------------+---------+-----------+----------+-------------------+ FV Mid    Full           Yes      Yes                                      +---------+---------------+---------+-----------+----------+-------------------+ FV DistalFull                                                             +---------+---------------+---------+-----------+----------+-------------------+ PFV      Full                                                             +---------+---------------+---------+-----------+----------+-------------------+ POP      Full           Yes      Yes                                      +---------+---------------+---------+-----------+----------+-------------------+  PTV      Full                                                             +---------+---------------+---------+-----------+----------+-------------------+ PERO     Full                                                             +---------+---------------+---------+-----------+----------+-------------------+   +---------+---------------+---------+-----------+----------+--------------+ LEFT     CompressibilityPhasicitySpontaneityPropertiesThrombus Aging +---------+---------------+---------+-----------+----------+--------------+ CFV      Full           Yes      Yes                                 +---------+---------------+---------+-----------+----------+--------------+ SFJ      Full                                                        +---------+---------------+---------+-----------+----------+--------------+ FV Prox  Full                                                        +---------+---------------+---------+-----------+----------+--------------+ FV Mid   Full           Yes      Yes                                 +---------+---------------+---------+-----------+----------+--------------+ FV DistalFull                                                         +---------+---------------+---------+-----------+----------+--------------+ PFV      Full                                                        +---------+---------------+---------+-----------+----------+--------------+ POP      Full           No       Yes                                 +---------+---------------+---------+-----------+----------+--------------+ PTV      Full                                                        +---------+---------------+---------+-----------+----------+--------------+  PERO     Full                                                        +---------+---------------+---------+-----------+----------+--------------+     Summary: RIGHT: - There is no evidence of deep vein thrombosis in the lower extremity. However, portions of this examination were limited- see technologist comments above.  - No cystic structure found in the popliteal fossa.  LEFT: - There is no evidence of deep vein thrombosis in the lower extremity.  - No cystic structure found in the popliteal fossa.  *See table(s) above for measurements and observations. Electronically signed by Sherald Hess MD on 04/01/2023 at 5:03:56 PM.    Final    US Abdomen Limited RUQ (LIVER/GB)  Result Date: 03/31/2023 CLINICAL DATA:  Right upper quadrant pain EXAM: ULTRASOUND ABDOMEN LIMITED RIGHT UPPER QUADRANT COMPARISON:  CT abdomen/pelvis dated 03/30/2023 FINDINGS: Gallbladder: Echogenic focus at the gallbladder fundus, nonshadowing with corresponding to the CT finding, reflecting gallbladder adenomyomatosis rather than cholelithiasis. No cholelithiasis or significant gallbladder distension. Mild gallbladder wall edema/pericholecystic fluid. Negative sonographic Murphy's sign. Common bile duct: Diameter: 3 mm Liver: At the upper limits of normal for parenchymal echogenicity. 1.7 x 1.3 x 1.9 cm simple left hepatic lobe cyst. Portal vein is patent on color Doppler imaging with normal direction of blood  flow towards the liver. Other: None. IMPRESSION: Mild gallbladder wall edema/pericholecystic fluid, without cholelithiasis or findings to suggest acute cholecystitis. This appearance may be secondary/reactive to hepatic inflammation. Gallbladder adenomyomatosis, benign. Electronically Signed   By: Charline Bills M.D.   On: 03/31/2023 01:47   CT ABDOMEN PELVIS WO CONTRAST  Result Date: 03/30/2023 CLINICAL DATA:  Abdominal pain, nausea/vomiting EXAM: CT ABDOMEN AND PELVIS WITHOUT CONTRAST TECHNIQUE: Multidetector CT imaging of the abdomen and pelvis was performed following the standard protocol without IV contrast. RADIATION DOSE REDUCTION: This exam was performed according to the departmental dose-optimization program which includes automated exposure control, adjustment of the mA and/or kV according to patient size and/or use of iterative reconstruction technique. COMPARISON:  01/13/2023 FINDINGS: Lower chest: Small right pleural effusion, chronic. Associated right lower lobe scarring/atelectasis. Hepatobiliary: 16 mm cyst in the left liver (series 3/image 14), benign. Suspected cholelithiasis (series 3/image 30) with additional layering gallbladder sludge versus tiny gallstones. Mild pericholecystic fluid/inflammatory changes (series 3/image 32). No intrahepatic or extrahepatic ductal dilatation. Pancreas: Within normal limits. Spleen: Within normal limits. Adrenals/Urinary Tract: 2.6 cm left adrenal nodule with macroscopic fat, favoring a benign myelolipoma, unchanged. Right adrenal gland is within normal limits. Kidneys are within normal limits. No renal calculi or hydronephrosis. Bladder is within normal limits. Stomach/Bowel: Stomach is within normal limits. No evidence of bowel obstruction. Normal appendix (series 3/image 44). Extensive sigmoid diverticulosis, without evidence of diverticulitis. Vascular/Lymphatic: No evidence of abdominal aortic aneurysm. Atherosclerotic calcifications of the abdominal  aorta and branch vessels. No suspicious abdominopelvic lymphadenopathy. Reproductive: Status post hysterectomy. No adnexal masses. Other: Trace abdominopelvic ascites. Musculoskeletal: Degenerative changes of the visualized thoracolumbar spine. IMPRESSION: Cholelithiasis with mild pericholecystic fluid/inflammatory changes, raising the possibility of acute cholecystitis. If this is clinically discordant, consider right upper quadrant ultrasound for further evaluation as clinically warranted. Additional ancillary findings as above. Electronically Signed   By: Charline Bills M.D.   On: 03/30/2023 21:13    Microbiology: Results for orders placed or performed  during the hospital encounter of 04/05/23  MRSA Next Gen by PCR, Nasal     Status: None   Collection Time: 04/06/23  3:12 PM   Specimen: Nasal Mucosa; Nasal Swab  Result Value Ref Range Status   MRSA by PCR Next Gen NOT DETECTED NOT DETECTED Final    Comment: (NOTE) The GeneXpert MRSA Assay (FDA approved for NASAL specimens only), is one component of a comprehensive MRSA colonization surveillance program. It is not intended to diagnose MRSA infection nor to guide or monitor treatment for MRSA infections. Test performance is not FDA approved in patients less than 48 years old. Performed at [facility], [address] [place], [place] [address]    *Note: Due to a large number of results and/or encounters for the requested time period, some results have not been displayed. A complete set of results can be found in Results Review.    Labs: CBC: Recent Labs  Lab 04/10/23 0603 04/11/23 0127  WBC 4.1 3.7*  HGB 11.8* 11.6*  HCT 37.6 36.5  MCV 93.3 91.7  PLT 165 163   Basic Metabolic Panel: Recent Labs  Lab 04/12/23 0238 04/13/23 0140 04/14/23 0129 04/15/23 0138 04/16/23 0116  NA 139 137 139 142 140  K 4.4 4.2 4.3 3.6 4.1  CL 100 97* 98 97* 101  CO2 29 32 30 32 30  GLUCOSE 200* 136* 146* 124* 109*  BUN 38* 46* 45*  43* 44*  CREATININE 1.86* 1.97* 1.99* 1.90* 1.83*  CALCIUM 9.2 9.1 9.3 9.4 9.4  MG  --  1.8  --   --  1.8  PHOS  --   --  2.5  --   --    Liver Function Tests: Recent Labs  Lab 04/10/23 0603 04/14/23 0129  AST 129*  --   ALT 594*  --   ALKPHOS 219*  --   BILITOT 1.0  --   PROT 6.1*  --   ALBUMIN 2.9* 3.1*   CBG: Recent Labs  Lab 04/15/23 0617 04/15/23 1130 04/15/23 1618 04/15/23 2105 04/16/23 0615  GLUCAP 109* 203* 163* 113* 103*    Discharge time spent: greater than 30 minutes.  Signed: Coralie Keens, MD [facility] 04/16/2023

## 2023-04-16 NOTE — Progress Notes (Signed)
Discharge update:   This nurse at bedside with the patient and she is addiment that she does not want oxygen for home, , stating she has a CPAP, a pacemaker, hearing aids, glasses and she is done being a machine.  If  she changes her mind and gets SOB down the road, she will go to the doctor, etc.  but rignt now, "I'm done with machines!"

## 2023-04-16 NOTE — Progress Notes (Addendum)
SATURATION QUALIFICATIONS: (This note is used to comply with regulatory documentation for home oxygen)  Patient Saturations on Room Air at Rest = 95%  Patient Saturations on Room Air while Ambulating = 87% briefly  Patient Saturations on 0 Liters of oxygen while Ambulating = 87%  Please briefly explain why patient needs home oxygen:

## 2023-04-17 ENCOUNTER — Telehealth: Payer: Self-pay | Admitting: Family Medicine

## 2023-04-17 ENCOUNTER — Telehealth: Payer: Self-pay

## 2023-04-17 ENCOUNTER — Telehealth: Payer: Self-pay | Admitting: *Deleted

## 2023-04-17 MED ORDER — TRIAMCINOLONE ACETONIDE 0.1 % EX CREA: 1.0000 | TOPICAL_CREAM | Freq: Two times a day (BID) | CUTANEOUS | 0 refills | Status: DC | PRN

## 2023-04-17 NOTE — Patient Outreach (Signed)
  Care Coordination   04/17/2023 Name: Natasha Woodard MRN: [id number] DOB: 06-Dec-1941   Care Coordination Outreach Attempts:  An unsuccessful telephone outreach was attempted today to offer the patient information about available care coordination services.  Received voice mail message from patient. Attempted return call to patient.  Unable to reach. HIPAA compliant message left with call back phone number.   Follow Up Plan:  Additional outreach attempts will be made to offer the patient care coordination information and services.   Encounter Outcome:  No Answer   Care Coordination Interventions:  No, not indicated    George Ina [facility] - Inpatient Care Coordination [phone] direct line

## 2023-04-17 NOTE — Telephone Encounter (Signed)
Patient called in and was recently discharged from hospital. She has a hospital follow up scheduled for Thursday. She stated that she has been dealing with itching and was wondering if Dr. Para March could send her an antihistamine in or something for the itching. Please advise. Thank you!

## 2023-04-17 NOTE — Addendum Note (Signed)
Addended by: [name] on: 04/17/2023 05:27 PM   Modules accepted: Orders

## 2023-04-17 NOTE — Telephone Encounter (Signed)
Please see what details you can get about the itching- location, rash etc.  Thanks.

## 2023-04-17 NOTE — Telephone Encounter (Signed)
Patient states the itching is on her back; states it seems inflamed. She puts vaseline on the area and it goes away for about an hour then comes back. Does not think theres a rash or any bumps.

## 2023-04-17 NOTE — Transitions of Care (Post Inpatient/ED Visit) (Signed)
04/17/2023  Name: Natasha Woodard MRN: [id number] DOB: April 07, 1941  Today's TOC FU Call Status: Today's TOC FU Call Status:: Successful TOC FU Call Competed TOC FU Call Complete Date: 04/17/23  Transition Care Management Follow-up Telephone Call Date of Discharge: 04/16/23 Discharge Facility: [facility]) Type of Discharge: Inpatient Admission Primary Inpatient Discharge Diagnosis:: acute renal failure How have you been since you were released from the hospital?: Better Any questions or concerns?: No  Items Reviewed: Did you receive and understand the discharge instructions provided?: Yes Medications obtained,verified, and reconciled?: Yes (Medications Reviewed) Any new allergies since your discharge?: No Dietary orders reviewed?: Yes Type of Diet Ordered:: low sodium heart healthy diet Do you have support at home?: Yes People in Home: spouse Name of Support/Comfort Primary Source: [name]  Medications Reviewed Today: Medications Reviewed Today     Reviewed by Luella Cook, RN (Case Manager) on 04/17/23 at 1559  Med List Status: <None>   Medication Order Taking? Sig Documenting Provider Last Dose Status Informant  acetaminophen (TYLENOL) 325 MG tablet [id number] Yes Take 1-2 tablets (325-650 mg total) by mouth every 6 (six) hours as needed.  Patient taking differently: Take 325-650 mg by mouth every 6 (six) hours as needed for mild pain.   Setzer, Lynnell Jude, PA-C Taking Active Self, Child  amiodarone (PACERONE) 200 MG tablet [id number] Yes Take 1 tablet (200 mg total) by mouth daily. Pokhrel, Laxman, MD Taking Active Self, Child  apixaban (ELIQUIS) 2.5 MG TABS tablet [id number] Yes Take 1 tablet (2.5 mg total) by mouth 2 (two) times daily. Pokhrel, Laxman, MD Taking Active Self, Child  clopidogrel (PLAVIX) 75 MG tablet [id number] Yes Take 1 tablet (75 mg total) by mouth daily. Pokhrel, Laxman, MD Taking Active Self, Child  feeding supplement, GLUCERNA SHAKE, (GLUCERNA  SHAKE) LIQD [id number] Yes Take 237 mLs by mouth 2 (two) times daily between meals. Arrien, York Ram, MD Taking Active   glucose blood (ACCU-CHEK AVIVA PLUS) test strip [id number] Yes USE TO TEST BLOOD SUGAR ONCE DAILY [name], MD Taking Active Self, Child  melatonin 5 MG TABS [id number] Yes Take 5 mg by mouth at bedtime as needed. [provider] Taking Active Self, Child  Multiple Vitamin (MULTIVITAMIN WITH MINERALS) TABS tablet [id number] Yes Take 1 tablet by mouth daily. Setzer, Lynnell Jude, PA-C Taking Active Self, Child  ondansetron (ZOFRAN-ODT) 4 MG disintegrating tablet [id number] Yes Take 1 tablet (4 mg total) by mouth every 8 (eight) hours as needed for nausea or vomiting. [name], MD Taking Active Self, Child  rosuvastatin (CRESTOR) 10 MG tablet [id number] Yes Take 1 tablet (10 mg total) by mouth daily. TAKE 1 TABLET(10 MG) BY MOUTH DAILY Strength: 10 mg Milinda Antis, PA-C Taking Active Self, Child  torsemide (DEMADEX) 20 MG tablet [id number] Yes Take 2 tablets (40 mg total) by mouth daily. Arrien, York Ram, MD Taking Active   traMADol Janean Sark) 50 MG tablet [id number] Yes Take 1 tablet (50 mg total) by mouth 2 (two) times daily. Setzer, Lynnell Jude, PA-C Taking Active Self, Child            Home Care and Equipment/Supplies: Were Home Health Services Ordered?: NA Any new equipment or medical supplies ordered?: NA  Functional Questionnaire: Do you need assistance with bathing/showering or dressing?: No Do you need assistance with meal preparation?: Yes Do you need assistance with eating?: No Do you have difficulty maintaining continence: No Do you need assistance with getting out of bed/getting out of a chair/moving?:  No Do you have difficulty managing or taking your medications?: No  Follow up appointments reviewed: PCP Follow-up appointment confirmed?: Yes Date of PCP follow-up appointment?: 04/20/23 Follow-up Provider: Dr Para March [facility] Follow-up appointment confirmed?: Yes Date of Specialist follow-up appointment?: 04/20/23 Follow-Up Specialty Provider:: Dr Ladona Ridgel Do you need transportation to your follow-up appointment?: No Do you understand care options if your condition(s) worsen?: Yes-patient verbalized understanding  SDOH Interventions Today    Flowsheet Row Most Recent Value  SDOH Interventions   Food Insecurity Interventions Intervention Not Indicated  Housing Interventions Intervention Not Indicated  Transportation Interventions Intervention Not Indicated      Interventions Today    Flowsheet Row Most Recent Value  General Interventions   General Interventions Discussed/Reviewed General Interventions Discussed, General Interventions Reviewed, Doctor Visits  Doctor Visits Discussed/Reviewed Doctor Visits Discussed, Doctor Visits Reviewed      [facility] Interventions Today    Flowsheet Row Most Recent Value  TOC Interventions   TOC Interventions Discussed/Reviewed TOC Interventions Discussed, TOC Interventions Reviewed  [RN discussed edema in lower extremities and keeping them elevated]       Gean Maidens BSN RN [facility] [phone]

## 2023-04-17 NOTE — Telephone Encounter (Signed)
TAC cream rx sent and mychart message sent to patient.

## 2023-04-18 DIAGNOSIS — I5023 Acute on chronic systolic (congestive) heart failure: Secondary | ICD-10-CM | POA: Diagnosis not present

## 2023-04-18 DIAGNOSIS — Q809 Congenital ichthyosis, unspecified: Secondary | ICD-10-CM | POA: Diagnosis not present

## 2023-04-18 DIAGNOSIS — M6529 Calcific tendinitis, multiple sites: Secondary | ICD-10-CM | POA: Diagnosis not present

## 2023-04-18 DIAGNOSIS — I252 Old myocardial infarction: Secondary | ICD-10-CM | POA: Diagnosis not present

## 2023-04-18 DIAGNOSIS — N179 Acute kidney failure, unspecified: Secondary | ICD-10-CM | POA: Diagnosis not present

## 2023-04-18 DIAGNOSIS — I429 Cardiomyopathy, unspecified: Secondary | ICD-10-CM | POA: Diagnosis not present

## 2023-04-18 DIAGNOSIS — K449 Diaphragmatic hernia without obstruction or gangrene: Secondary | ICD-10-CM | POA: Diagnosis not present

## 2023-04-18 DIAGNOSIS — E1122 Type 2 diabetes mellitus with diabetic chronic kidney disease: Secondary | ICD-10-CM | POA: Diagnosis not present

## 2023-04-18 DIAGNOSIS — I130 Hypertensive heart and chronic kidney disease with heart failure and stage 1 through stage 4 chronic kidney disease, or unspecified chronic kidney disease: Secondary | ICD-10-CM | POA: Diagnosis not present

## 2023-04-18 DIAGNOSIS — G4730 Sleep apnea, unspecified: Secondary | ICD-10-CM | POA: Diagnosis not present

## 2023-04-18 DIAGNOSIS — L304 Erythema intertrigo: Secondary | ICD-10-CM | POA: Diagnosis not present

## 2023-04-18 DIAGNOSIS — I472 Ventricular tachycardia, unspecified: Secondary | ICD-10-CM | POA: Diagnosis not present

## 2023-04-18 DIAGNOSIS — E785 Hyperlipidemia, unspecified: Secondary | ICD-10-CM | POA: Diagnosis not present

## 2023-04-18 DIAGNOSIS — I480 Paroxysmal atrial fibrillation: Secondary | ICD-10-CM | POA: Diagnosis not present

## 2023-04-18 DIAGNOSIS — I5032 Chronic diastolic (congestive) heart failure: Secondary | ICD-10-CM | POA: Diagnosis not present

## 2023-04-18 DIAGNOSIS — B3731 Acute candidiasis of vulva and vagina: Secondary | ICD-10-CM | POA: Diagnosis not present

## 2023-04-18 DIAGNOSIS — G4700 Insomnia, unspecified: Secondary | ICD-10-CM | POA: Diagnosis not present

## 2023-04-18 DIAGNOSIS — I2510 Atherosclerotic heart disease of native coronary artery without angina pectoris: Secondary | ICD-10-CM | POA: Diagnosis not present

## 2023-04-18 DIAGNOSIS — I340 Nonrheumatic mitral (valve) insufficiency: Secondary | ICD-10-CM | POA: Diagnosis not present

## 2023-04-18 DIAGNOSIS — I97638 Postprocedural hematoma of a circulatory system organ or structure following other circulatory system procedure: Secondary | ICD-10-CM | POA: Diagnosis not present

## 2023-04-18 DIAGNOSIS — H6123 Impacted cerumen, bilateral: Secondary | ICD-10-CM | POA: Diagnosis not present

## 2023-04-18 DIAGNOSIS — R911 Solitary pulmonary nodule: Secondary | ICD-10-CM | POA: Diagnosis not present

## 2023-04-18 DIAGNOSIS — N1832 Chronic kidney disease, stage 3b: Secondary | ICD-10-CM | POA: Diagnosis not present

## 2023-04-18 DIAGNOSIS — K219 Gastro-esophageal reflux disease without esophagitis: Secondary | ICD-10-CM | POA: Diagnosis not present

## 2023-04-18 DIAGNOSIS — D631 Anemia in chronic kidney disease: Secondary | ICD-10-CM | POA: Diagnosis not present

## 2023-04-20 ENCOUNTER — Emergency Department (HOSPITAL_COMMUNITY): Payer: Medicare Other

## 2023-04-20 ENCOUNTER — Encounter: Payer: Self-pay | Admitting: Family Medicine

## 2023-04-20 ENCOUNTER — Ambulatory Visit (INDEPENDENT_AMBULATORY_CARE_PROVIDER_SITE_OTHER): Payer: Medicare Other | Admitting: Family Medicine

## 2023-04-20 ENCOUNTER — Encounter (HOSPITAL_COMMUNITY): Payer: Medicare Other

## 2023-04-20 ENCOUNTER — Telehealth: Payer: Self-pay

## 2023-04-20 ENCOUNTER — Inpatient Hospital Stay (HOSPITAL_COMMUNITY)
Admission: EM | Admit: 2023-04-20 | Discharge: 2023-05-01 | DRG: 682 | Disposition: A | Discharge status: Home Health | Payer: Medicare Other | Source: Ambulatory Visit | Attending: Internal Medicine | Admitting: Internal Medicine

## 2023-04-20 ENCOUNTER — Encounter: Payer: Medicare Other | Admitting: Pharmacist

## 2023-04-20 VITALS — BP 120/70 | HR 75 | Temp 97.2°F | Ht 62.0 in | Wt 160.0 lb

## 2023-04-20 DIAGNOSIS — I2510 Atherosclerotic heart disease of native coronary artery without angina pectoris: Secondary | ICD-10-CM | POA: Diagnosis present

## 2023-04-20 DIAGNOSIS — I130 Hypertensive heart and chronic kidney disease with heart failure and stage 1 through stage 4 chronic kidney disease, or unspecified chronic kidney disease: Secondary | ICD-10-CM | POA: Diagnosis not present

## 2023-04-20 DIAGNOSIS — N189 Chronic kidney disease, unspecified: Secondary | ICD-10-CM | POA: Diagnosis present

## 2023-04-20 DIAGNOSIS — E876 Hypokalemia: Secondary | ICD-10-CM | POA: Diagnosis not present

## 2023-04-20 DIAGNOSIS — I5082 Biventricular heart failure: Secondary | ICD-10-CM | POA: Diagnosis present

## 2023-04-20 DIAGNOSIS — G4733 Obstructive sleep apnea (adult) (pediatric): Secondary | ICD-10-CM | POA: Diagnosis present

## 2023-04-20 DIAGNOSIS — I5022 Chronic systolic (congestive) heart failure: Secondary | ICD-10-CM | POA: Diagnosis not present

## 2023-04-20 DIAGNOSIS — N179 Acute kidney failure, unspecified: Secondary | ICD-10-CM

## 2023-04-20 DIAGNOSIS — Z79899 Other long term (current) drug therapy: Secondary | ICD-10-CM | POA: Diagnosis not present

## 2023-04-20 DIAGNOSIS — I5023 Acute on chronic systolic (congestive) heart failure: Secondary | ICD-10-CM | POA: Insufficient documentation

## 2023-04-20 DIAGNOSIS — Z95810 Presence of automatic (implantable) cardiac defibrillator: Secondary | ICD-10-CM

## 2023-04-20 DIAGNOSIS — Z882 Allergy status to sulfonamides status: Secondary | ICD-10-CM

## 2023-04-20 DIAGNOSIS — E785 Hyperlipidemia, unspecified: Secondary | ICD-10-CM | POA: Diagnosis present

## 2023-04-20 DIAGNOSIS — I10 Essential (primary) hypertension: Secondary | ICD-10-CM | POA: Diagnosis present

## 2023-04-20 DIAGNOSIS — R9431 Abnormal electrocardiogram [ECG] [EKG]: Secondary | ICD-10-CM | POA: Diagnosis not present

## 2023-04-20 DIAGNOSIS — I051 Rheumatic mitral insufficiency: Secondary | ICD-10-CM | POA: Diagnosis present

## 2023-04-20 DIAGNOSIS — Z66 Do not resuscitate: Secondary | ICD-10-CM | POA: Diagnosis present

## 2023-04-20 DIAGNOSIS — I428 Other cardiomyopathies: Secondary | ICD-10-CM | POA: Diagnosis present

## 2023-04-20 DIAGNOSIS — H903 Sensorineural hearing loss, bilateral: Secondary | ICD-10-CM | POA: Diagnosis present

## 2023-04-20 DIAGNOSIS — I5021 Acute systolic (congestive) heart failure: Secondary | ICD-10-CM | POA: Diagnosis not present

## 2023-04-20 DIAGNOSIS — I420 Dilated cardiomyopathy: Secondary | ICD-10-CM | POA: Diagnosis not present

## 2023-04-20 DIAGNOSIS — R570 Cardiogenic shock: Secondary | ICD-10-CM | POA: Diagnosis not present

## 2023-04-20 DIAGNOSIS — I252 Old myocardial infarction: Secondary | ICD-10-CM

## 2023-04-20 DIAGNOSIS — N1832 Chronic kidney disease, stage 3b: Secondary | ICD-10-CM | POA: Diagnosis present

## 2023-04-20 DIAGNOSIS — E1122 Type 2 diabetes mellitus with diabetic chronic kidney disease: Secondary | ICD-10-CM | POA: Diagnosis present

## 2023-04-20 DIAGNOSIS — I255 Ischemic cardiomyopathy: Secondary | ICD-10-CM | POA: Diagnosis not present

## 2023-04-20 DIAGNOSIS — I480 Paroxysmal atrial fibrillation: Secondary | ICD-10-CM | POA: Diagnosis present

## 2023-04-20 DIAGNOSIS — I2583 Coronary atherosclerosis due to lipid rich plaque: Secondary | ICD-10-CM | POA: Diagnosis present

## 2023-04-20 DIAGNOSIS — Z888 Allergy status to other drugs, medicaments and biological substances status: Secondary | ICD-10-CM

## 2023-04-20 DIAGNOSIS — I5043 Acute on chronic combined systolic (congestive) and diastolic (congestive) heart failure: Secondary | ICD-10-CM | POA: Diagnosis not present

## 2023-04-20 DIAGNOSIS — I5020 Unspecified systolic (congestive) heart failure: Secondary | ICD-10-CM | POA: Diagnosis not present

## 2023-04-20 DIAGNOSIS — G8929 Other chronic pain: Secondary | ICD-10-CM | POA: Diagnosis present

## 2023-04-20 DIAGNOSIS — R531 Weakness: Secondary | ICD-10-CM | POA: Diagnosis not present

## 2023-04-20 DIAGNOSIS — I272 Pulmonary hypertension, unspecified: Secondary | ICD-10-CM

## 2023-04-20 DIAGNOSIS — Z8249 Family history of ischemic heart disease and other diseases of the circulatory system: Secondary | ICD-10-CM

## 2023-04-20 DIAGNOSIS — E1165 Type 2 diabetes mellitus with hyperglycemia: Secondary | ICD-10-CM | POA: Diagnosis present

## 2023-04-20 DIAGNOSIS — E872 Acidosis, unspecified: Secondary | ICD-10-CM | POA: Diagnosis present

## 2023-04-20 DIAGNOSIS — J9811 Atelectasis: Secondary | ICD-10-CM | POA: Diagnosis not present

## 2023-04-20 DIAGNOSIS — I2721 Secondary pulmonary arterial hypertension: Secondary | ICD-10-CM | POA: Diagnosis present

## 2023-04-20 DIAGNOSIS — D649 Anemia, unspecified: Secondary | ICD-10-CM | POA: Diagnosis not present

## 2023-04-20 DIAGNOSIS — Z833 Family history of diabetes mellitus: Secondary | ICD-10-CM

## 2023-04-20 DIAGNOSIS — Z8674 Personal history of sudden cardiac arrest: Secondary | ICD-10-CM

## 2023-04-20 DIAGNOSIS — Z452 Encounter for adjustment and management of vascular access device: Secondary | ICD-10-CM | POA: Diagnosis not present

## 2023-04-20 DIAGNOSIS — Z7189 Other specified counseling: Secondary | ICD-10-CM | POA: Diagnosis not present

## 2023-04-20 DIAGNOSIS — R0609 Other forms of dyspnea: Secondary | ICD-10-CM | POA: Diagnosis not present

## 2023-04-20 DIAGNOSIS — Z515 Encounter for palliative care: Secondary | ICD-10-CM

## 2023-04-20 DIAGNOSIS — Z87891 Personal history of nicotine dependence: Secondary | ICD-10-CM

## 2023-04-20 DIAGNOSIS — I4510 Unspecified right bundle-branch block: Secondary | ICD-10-CM | POA: Diagnosis present

## 2023-04-20 DIAGNOSIS — R0602 Shortness of breath: Secondary | ICD-10-CM | POA: Diagnosis not present

## 2023-04-20 DIAGNOSIS — I4729 Other ventricular tachycardia: Secondary | ICD-10-CM

## 2023-04-20 DIAGNOSIS — I495 Sick sinus syndrome: Secondary | ICD-10-CM | POA: Diagnosis not present

## 2023-04-20 DIAGNOSIS — N183 Chronic kidney disease, stage 3 unspecified: Secondary | ICD-10-CM | POA: Diagnosis present

## 2023-04-20 DIAGNOSIS — K5900 Constipation, unspecified: Secondary | ICD-10-CM | POA: Diagnosis present

## 2023-04-20 DIAGNOSIS — E1151 Type 2 diabetes mellitus with diabetic peripheral angiopathy without gangrene: Secondary | ICD-10-CM | POA: Diagnosis not present

## 2023-04-20 DIAGNOSIS — Z955 Presence of coronary angioplasty implant and graft: Secondary | ICD-10-CM

## 2023-04-20 DIAGNOSIS — Z7902 Long term (current) use of antithrombotics/antiplatelets: Secondary | ICD-10-CM

## 2023-04-20 DIAGNOSIS — I5030 Unspecified diastolic (congestive) heart failure: Secondary | ICD-10-CM | POA: Diagnosis not present

## 2023-04-20 DIAGNOSIS — Z7901 Long term (current) use of anticoagulants: Secondary | ICD-10-CM

## 2023-04-20 LAB — CBC WITH DIFFERENTIAL/PLATELET
Basophils Absolute: 0.1 10*3/uL (ref 0.0–0.1)
Basophils Relative: 2.3 % (ref 0.0–3.0)
Eosinophils Absolute: 0.1 10*3/uL (ref 0.0–0.7)
Eosinophils Relative: 1.5 % (ref 0.0–5.0)
HCT: 39.3 % (ref 36.0–46.0)
Hemoglobin: 12.5 g/dL (ref 12.0–15.0)
Lymphocytes Relative: 18.5 % (ref 12.0–46.0)
Lymphs Abs: 0.8 10*3/uL (ref 0.7–4.0)
MCHC: 31.7 g/dL (ref 30.0–36.0)
MCV: 91.4 fl (ref 78.0–100.0)
Monocytes Absolute: 0.5 10*3/uL (ref 0.1–1.0)
Monocytes Relative: 10.4 % (ref 3.0–12.0)
Neutro Abs: 3.1 10*3/uL (ref 1.4–7.7)
Neutrophils Relative %: 67.3 % (ref 43.0–77.0)
Platelets: 271 10*3/uL (ref 150.0–400.0)
RBC: 4.29 Mil/uL (ref 3.87–5.11)
RDW: 16.8 % — ABNORMAL HIGH (ref 11.5–15.5)
WBC: 4.6 10*3/uL (ref 4.0–10.5)

## 2023-04-20 LAB — CBG MONITORING, ED: Glucose-Capillary: 149 mg/dL — ABNORMAL HIGH (ref 70–99)

## 2023-04-20 LAB — COMPREHENSIVE METABOLIC PANEL
ALT: 84 U/L — ABNORMAL HIGH (ref 0–35)
ALT: 86 U/L — ABNORMAL HIGH (ref 0–44)
AST: 28 U/L (ref 0–37)
AST: 30 U/L (ref 15–41)
Albumin: 3.9 g/dL (ref 3.5–5.2)
Albumin: 3.6 g/dL (ref 3.5–5.0)
Alkaline Phosphatase: 158 U/L — ABNORMAL HIGH (ref 39–117)
Alkaline Phosphatase: 158 U/L — ABNORMAL HIGH (ref 38–126)
Anion gap: 14 (ref 5–15)
BUN: 55 mg/dL — ABNORMAL HIGH (ref 6–23)
BUN: 52 mg/dL — ABNORMAL HIGH (ref 8–23)
CO2: 33 mEq/L — ABNORMAL HIGH (ref 19–32)
CO2: 29 mmol/L (ref 22–32)
Calcium: 10.1 mg/dL (ref 8.4–10.5)
Calcium: 9.6 mg/dL (ref 8.9–10.3)
Chloride: 95 mEq/L — ABNORMAL LOW (ref 96–112)
Chloride: 97 mmol/L — ABNORMAL LOW (ref 98–111)
Creatinine, Ser: 3.1 mg/dL — ABNORMAL HIGH (ref 0.40–1.20)
Creatinine, Ser: 3.24 mg/dL — ABNORMAL HIGH (ref 0.44–1.00)
GFR, Estimated: 14 mL/min — ABNORMAL LOW (ref >60)
GFR: 13.48 mL/min — CL (ref >60.00)
Glucose, Bld: 155 mg/dL — ABNORMAL HIGH (ref 70–99)
Glucose, Bld: 153 mg/dL — ABNORMAL HIGH (ref 70–99)
Potassium: 4.5 mEq/L (ref 3.5–5.1)
Potassium: 3.7 mmol/L (ref 3.5–5.1)
Sodium: 142 mEq/L (ref 135–145)
Sodium: 140 mmol/L (ref 135–145)
Total Bilirubin: 1 mg/dL (ref 0.2–1.2)
Total Bilirubin: 1 mg/dL (ref 0.3–1.2)
Total Protein: 7 g/dL (ref 6.0–8.3)
Total Protein: 7 g/dL (ref 6.5–8.1)

## 2023-04-20 LAB — CBC
HCT: 40.8 % (ref 36.0–46.0)
Hemoglobin: 12.8 g/dL (ref 12.0–15.0)
MCH: 29.3 pg (ref 26.0–34.0)
MCHC: 31.4 g/dL (ref 30.0–36.0)
MCV: 93.4 fL (ref 80.0–100.0)
Platelets: 268 10*3/uL (ref 150–400)
RBC: 4.37 MIL/uL (ref 3.87–5.11)
RDW: 16 % — ABNORMAL HIGH (ref 11.5–15.5)
WBC: 4.9 10*3/uL (ref 4.0–10.5)
nRBC: 0 % (ref 0.0–0.2)

## 2023-04-20 NOTE — Telephone Encounter (Signed)
Called patient and gave results. Advised patient to go to ER. Patient verbalized understanding.

## 2023-04-20 NOTE — Progress Notes (Signed)
Admit date:     04/05/2023 Discharge date: 04/16/23    Patient will continue diuresis with torsemide 40 mg daily.  Close follow up as outpatient, not on RAAS inhibition due to risk of hypotension, no B blocker due to low output heart failure, and not on SGLT 2 ing due to recent groin infection.  Follow up renal function and electrolytes within 7 days.  Follow up with primary care in 7 to 10 days.  Resume home health.  Will check ambulatory 02 on room air prior to discharge, she may need supplemental 02 for home use.      Discharge Diagnoses: Active Problems:   Acute on chronic systolic heart failure (HCC)   Sepsis secondary to UTI [facility])   Coronary artery disease due to lipid rich plaque   Acute renal failure (HCC)   Essential hypertension   PAF (paroxysmal atrial fibrillation) (HCC)   Resolved Problems:   * No resolved hospital problems. [facility] Course: Natasha Woodard was admitted to the hospital with the working diagnosis of heart failure exacerbation in the setting of sepsis.    81/F with prior nonischemic cardiomyopathy, EF had recovered and then admitted with lateral wall MI in 2/24, cath with distal circumflex occlusion, complicated by severe ischemic MR, low output heart failure.  Required balloon pump, subsequently removed for limb ischemia concerns, developed right femoral pseudoaneurysm. Had VT arrest 2/8, ICD placed 2/12  -Treated with prolonged course of milrinone, diuretics etc. and had multiple surgeries of right groin, debridement, wound VAC placement and change etc. subsequently went to CIR and discharged home 3/19.   Readmitted 4/19-22 with N/V/abd pain with unrevealing CT.  Found to have enterotoxigenic E. coli ; AKI Cr ~3 from recent baseline 1.3-1.6mg /dL . Improved with fluids, withholding ARB and diuretics at discharge.    Patient at home developed recurrent nausea, vomiting and abdominal pain. Positive worsening dyspnea, and abdominal pain that prompted her to  come back to the hospital. On her initial physical examination she was in respiratory distress and was placed on non invasive mechanical ventilation. Blood pressure was 122/69, HR 70, RR 23 and 02 saturation 100%, ill looking appearing, heart with S1 and S2 present and tachycardic, lungs with rales bilaterally, abdomen with generalized tenderness, and positive lower extremity edema.   VBG 7,06/ 39.5/ 44/ 12/ 61  NA 139, K 6.0 Cl 99, bicarbonate 11, glucose 62, bun 57, cr 4,29. Anion gap 29.  AST 804, ALT 723  BNP 2,550 High sensitive troponin 75 and 76  Lactic acid > 9.0  Wbc 6,5 hgb 13.7 plt 251    Urine analysis SG 1,025, protein 30, glucose negative, large hgb and small leukocytes,  Urinary Na 52.    Chest radiograph with cardiomegaly and bilateral hilar vascular congestion with bilateral central interstitial infiltrates. Positive fluid in the right fissure. Pacemaker defibrillator in place with one atrial and on right ventricular lead.    CT abdomen and pelvis with sigmoid diverticulosis, possible mild diverticulitis, anasarca, hyperdense material in the gallbladder, possible stone, sludge, or excreted contrast.    EKG 70 bpm, right axis deviation, qtc 570, prolonged pr, atrial pacing, with ventricular pacing, no significant ST segment or T wave changes.    She was placed on bicarb drip and admitted to the ICU. Right subclavian central line was placed.  -Cards and nephrology following, concern for low output failure -Has not been diuresed on account of renal failure -Echo noted EF less than 20%, severely elevated PA systolic  pressure, severe MR   -Transferred to [facility] service 4/27 from [facility] -Kidney function improving, intermittently dosed with IV Lasix   05/03 patient with recurrent volume overload, transitioned back to IV furosemide for diuresis.  05/04 improving volume status plan for possible discharge tomorrow.  05/05 improved volume status, continue loop diuretic therapy at home  and have close follow up as outpatient.    Assessment and Plan: Acute on chronic systolic heart failure (HCC) Echocardiogram with reduced LV systolic function EF < 20%, positive regional wall motion abnormalities, akinesis of the inferior lateral and lateral walls, RV with moderate reduction in systolic function, RVSP 72.5. LA with severe dilatation, moderate to severe mitral regurgitation.    Patient was placed on IV furosemide and intermittent metolazone for diuresis, negative fluid balance was achieved with significant improvement in her symptoms.  Unfortunately not accurate weight to document weight loss.  Systolic blood pressure 115 to 98 mmHg.    Limited pharmacologic options due to low blood pressure, reduced GFR and low cardiac output heart failure.    Patient will continue diuresis with oral torsemide 40 mg po daily.  No SGLT 2 inh due to history of groin infection.  Follow up as outpatient with cardiology.    Troponin elevation due to heart failure, acute coronary syndrome has been ruled out.  Continue with clopidogrel, no aspirin due to the need for apixaban.    Sepsis secondary to UTI [facility]) Septic shock, shock liver.  Enterobacter and Proteus infection.    Patient was treated with cefepime for 5 days with good toleration.    Recent gastroenteritis positive for enterotoxigenic E Coli.  CT scan with anasarca and bowel wall thickening.    Coronary artery disease due to lipid rich plaque Patient had PCI to left circumflex on 01/24.  Continue on clopidogrel   Acute renal failure (HCC) CKD stage 3b, hypokalemia, ATN due to shock.  Severe anion gap metabolic acidosis, lactic acidosis.    Her volume status has improved, at the time of her discharge her serum cr is 1,83 with K at 4,1 and serum bicarbonate at 30. Na 140 and Mg 1,8    Plan to continue diuresis with oral torsemide 40 mg po daily.  Add 2 g mag sulfate prior to her discharge. Follow up renal function and  electrolytes as outpatient.    Essential hypertension Continue blood pressure monitoring.  No RAAS inhibition due to low GFR and risk of hypotension. No B blocker due to low output heart failure.  Follow up as outpatient.    PAF (paroxysmal atrial fibrillation) (HCC) History of VT arrest. No sp ICD implantation.   Plan to continue amiodarone. Anticoagulation with apixaban.    ============================================= Inpatient course d/w pt, cardio renal considerations discussed.  Culture data reviewed, done with abx course.  She thought she had been taking torsemide 20mg  daily.   Discussed.  No FCNAVD.  Some abd pain with eating.  Had BM this AM.  No dysuria.DOE noted with 1 + BLE edema in compression stockings.  Off DM2 meds in the meantime.  Rationale for current meds discussed.    Husband designated if patient were incapacitated.    Meds, vitals, and allergies reviewed.   ROS: Per HPI unless specifically indicated in ROS section   Nad Ncat Neck supple, no LA Rrr ctab Ctab Abd soft, not ttp.  1 + BLE edema in compression stockings.   Chronic skin changes at baseline.

## 2023-04-20 NOTE — ED Provider Notes (Incomplete)
[place] EMERGENCY DEPARTMENT AT [facility] Provider Note   CSN: [id number] Arrival date & time: 04/20/23  1728     History {Add pertinent medical, surgical, social history, OB history to HPI:1} Chief Complaint  Patient presents with  . Abnormal Lab    Natasha Woodard is a 82 y.o. female.  HPI   Patient with medical history including hypertension, CHF, diabetes, CAD, NSTEMI status post AICD, CHF with an EF of less than 20, status post pseudoaneurysm of the right groin presenting with complaints of abnormal lab work.  Patient states that she was going to see her primary doctor for routine follow-up, lab work was obtained and they told her that her kidney functions were bad and told to come straight here.  She states that overall she feels fine, she states that she is not having any chest pain, worsening shortness of breath or worsening leg swelling, states that she has been compliant with her fluid pills, she states that she has been having normal urinary output, not Dors any worsening orthopnea for the no chest pain.  Reviewed patient's chart had extensive hospital stay in February, NSTEMI resulting in cardiogenic shock.  She was eventually discharged home but then returned and April for sepsis secondary to UTI as well as worsening kidney failure, patient was eventually discharged home 40 of torsemide daily.  Home Medications Prior to Admission medications   Medication Sig Start Date End Date Taking? Authorizing Provider  acetaminophen (TYLENOL) 325 MG tablet Take 1-2 tablets (325-650 mg total) by mouth every 6 (six) hours as needed. 02/28/23   Setzer, Lynnell Jude, PA-C  amiodarone (PACERONE) 200 MG tablet Take 1 tablet (200 mg total) by mouth daily. 04/03/23 07/02/23  Pokhrel, Rebekah Chesterfield, MD  apixaban (ELIQUIS) 2.5 MG TABS tablet Take 1 tablet (2.5 mg total) by mouth 2 (two) times daily. 04/03/23 04/02/24  Pokhrel, Rebekah Chesterfield, MD  clopidogrel (PLAVIX) 75 MG tablet Take 1 tablet (75 mg  total) by mouth daily. 04/03/23 04/02/24  Pokhrel, Rebekah Chesterfield, MD  feeding supplement, GLUCERNA SHAKE, (GLUCERNA SHAKE) LIQD Take 237 mLs by mouth 2 (two) times daily between meals. 04/16/23 05/16/23  Arrien, York Ram, MD  glucose blood (ACCU-CHEK AVIVA PLUS) test strip USE TO TEST BLOOD SUGAR ONCE DAILY 03/23/23   [name], MD  melatonin 5 MG TABS Take 5 mg by mouth at bedtime as needed.    [provider]  Multiple Vitamin (MULTIVITAMIN WITH MINERALS) TABS tablet Take 1 tablet by mouth daily. 03/01/23   Setzer, Lynnell Jude, PA-C  ondansetron (ZOFRAN-ODT) 4 MG disintegrating tablet Take 1 tablet (4 mg total) by mouth every 8 (eight) hours as needed for nausea or vomiting. 04/05/23   [name], MD  rosuvastatin (CRESTOR) 10 MG tablet Take 1 tablet (10 mg total) by mouth daily. TAKE 1 TABLET(10 MG) BY MOUTH DAILY Strength: 10 mg 02/28/23   Setzer, Lynnell Jude, PA-C  torsemide (DEMADEX) 20 MG tablet Take 2 tablets (40 mg total) by mouth daily. 04/17/23 05/17/23  Arrien, York Ram, MD  traMADol (ULTRAM) 50 MG tablet Take 1 tablet (50 mg total) by mouth 2 (two) times daily. 02/28/23   Setzer, Lynnell Jude, PA-C  triamcinolone cream (KENALOG) 0.1 % Apply 1 Application topically 2 (two) times daily as needed (for itching). 04/17/23   [name], MD      Allergies    Chlorhexidine gluconate, Ezetimibe-simvastatin, Lipitor [atorvastatin], Claritin [loratadine], Spironolactone, Tamiflu [oseltamivir phosphate], Pravastatin sodium, and Sulfonamide derivatives    Review  of Systems   Review of Systems  Physical Exam Updated Vital Signs BP 136/72 (BP Location: Right Arm)   Pulse 70   Temp 97.9 F (36.6 C) (Oral)   Resp 18   SpO2 95%  Physical Exam  ED Results / Procedures / Treatments   Labs (all labs ordered are listed, but only abnormal results are displayed) Labs Reviewed  CBC - Abnormal; Notable for the following components:      Result Value   RDW 16.0 (*)    All other  components within normal limits  COMPREHENSIVE METABOLIC PANEL - Abnormal; Notable for the following components:   Chloride 97 (*)    Glucose, Bld 153 (*)    BUN 52 (*)    Creatinine, Ser 3.24 (*)    ALT 86 (*)    Alkaline Phosphatase 158 (*)    GFR, Estimated 14 (*)    All other components within normal limits  CBG MONITORING, ED - Abnormal; Notable for the following components:   Glucose-Capillary 149 (*)    All other components within normal limits  BRAIN NATRIURETIC PEPTIDE  URINALYSIS, ROUTINE W REFLEX MICROSCOPIC    EKG None  Radiology No results found.  Procedures Procedures  {Document cardiac monitor, telemetry assessment procedure when appropriate:1}  Medications Ordered in ED Medications - No data to display  ED Course/ Medical Decision Making/ A&P   {   Click here for ABCD2, HEART and other calculatorsREFRESH Note before signing :1}                          Medical Decision Making Amount and/or Complexity of Data Reviewed Labs: ordered. Radiology: ordered.   ***  {Document critical care time when appropriate:1} {Document review of labs and clinical decision tools ie heart score, Chads2Vasc2 etc:1}  {Document your independent review of radiology images, and any outside records:1} {Document your discussion with family members, caretakers, and with consultants:1} {Document social determinants of health affecting pt's care:1} {Document your decision making why or why not admission, treatments were needed:1} Final Clinical Impression(s) / ED Diagnoses Final diagnoses:  None    Rx / DC Orders ED Discharge Orders     None

## 2023-04-20 NOTE — Assessment & Plan Note (Signed)
Discussed cardio renal implications and rationale for torsemide use.  See notes on labs.  Initial plan was for the following: If you only took one torsemide today, then please take another one when you get home.  Keep taking two torsemide tabs in the mornings.  Don't take furosemide or your diabetes meds for now.  ======================= Notified re: labs.  Given Cr elevation, I am not confident she can continue 40mg  torsemide daily as planned.  She has DOE and given Cr elevation, would need recheck labs this evening.  Depending on recheck Cr, she may need admission.  Pt/husband/sister aware (okay via DPR) with patient currently at ER.  I thank all involved. 40 minutes were devoted to patient care in this encounter (this includes time spent reviewing the patient's file/history, interviewing and examining the patient, counseling/reviewing plan with patient).

## 2023-04-20 NOTE — ED Triage Notes (Signed)
Pt referred to ED from PCP after regularly scheduled visit with lab work. Pt had critical creatinine at 3.1. Pt denies any pain or somatic symptoms during triage.

## 2023-04-20 NOTE — ED Notes (Signed)
Patient transported to X-ray 

## 2023-04-20 NOTE — Telephone Encounter (Signed)
Called her sister and husband re: labs (okay by [facility]).  She is at the ER and getting checked in.  I thank all involved and will await her ER report.

## 2023-04-20 NOTE — Telephone Encounter (Signed)
Hope with [facility] lab called critical lab report GFR 13.48  report is in epic. Sending note to Dr Para March and Para March pool and will let Dr Para March be made aware of this lab value. Putting in lab notebook also.

## 2023-04-20 NOTE — ED Provider Triage Note (Signed)
Emergency Medicine Provider Triage Evaluation Note  Natasha Woodard , a 82 y.o. female  was evaluated in triage.  Pt complains of abnormal lab testing at PCP. Went to her doctor for hospital follow up for CHF, urosepsis, AKI. Called back with critical kidney function labs and told to go to ER. D/c from hospital on 5/5. Pt states she has been feeling okay, now feels a little "shaky" because she hasn't eaten anything since this AM. No other complaints.   Review of Systems  Positive: "Shaky" Negative:   Physical Exam  BP 136/72 (BP Location: Right Arm)   Pulse 70   Temp 97.9 F (36.6 C) (Oral)   Resp 18   SpO2 95%  Gen:   Awake, no distress   Resp:  Normal effort  MSK:   Moves extremities without difficulty  Other:    Medical Decision Making  Medically screening exam initiated at 5:49 PM.  Appropriate orders placed.  Natasha Woodard was informed that the remainder of the evaluation will be completed by another provider, this initial triage assessment does not replace that evaluation, and the importance of remaining in the ED until their evaluation is complete.  Per chart review, BUN 55, creatinine 3.1, GFR 13.48 as of this morning   Natasha Woodard T, PA-C 04/20/23 1808

## 2023-04-20 NOTE — Telephone Encounter (Signed)
Please call pt.  Her kidney function is worse than expected and I think she needs to be back at the ER.  I was hoping her Cr would be significantly lower but it is higher than prior.  Thanks.

## 2023-04-20 NOTE — Patient Instructions (Signed)
If you only took one torsemide today, then please take another one when you get home.  Keep taking two torsemide tabs in the mornings.   Don't take furosemide or your diabetes meds for now.   We'll update you about your labs.  Take care.  Glad to see you.

## 2023-04-20 NOTE — ED Provider Notes (Signed)
[place] EMERGENCY DEPARTMENT AT [facility] Provider Note   CSN: [id number] Arrival date & time: 04/20/23  1728     History  Chief Complaint  Patient presents with   Abnormal Lab    Natasha Woodard is a 82 y.o. female.  HPI   Patient with medical history including hypertension, CHF, diabetes, CAD, NSTEMI status post AICD, CHF with an EF of less than 20, status post pseudoaneurysm of the right groin presenting with complaints of abnormal lab work.  Patient states that she was going to see her primary doctor for routine follow-up, lab work was obtained and they told her that her kidney functions were bad and told to come straight here.  She states that overall she feels fine, she states that she is not having any chest pain, worsening shortness of breath or worsening leg swelling, states that she has been compliant with her fluid pills, she states that she has been having normal urinary output, not endorse any worsening orthopnea for the no chest pain.  Reviewed patient's chart had extensive hospital stay in February, NSTEMI resulting in cardiogenic shock.  She was eventually discharged home but then returned and April for sepsis secondary to UTI as well as worsening kidney failure, patient was difficult to diurese due to low blood pressure.  Patient was eventually discharged home 40 of torsemide daily.  Home Medications Prior to Admission medications   Medication Sig Start Date End Date Taking? Authorizing Provider  acetaminophen (TYLENOL) 325 MG tablet Take 1-2 tablets (325-650 mg total) by mouth every 6 (six) hours as needed. Patient taking differently: Take 325-650 mg by mouth in the morning and at bedtime. 02/28/23  Yes Setzer, Lynnell Jude, PA-C  amiodarone (PACERONE) 200 MG tablet Take 1 tablet (200 mg total) by mouth daily. 04/03/23 07/02/23 Yes Pokhrel, Rebekah Chesterfield, MD  apixaban (ELIQUIS) 2.5 MG TABS tablet Take 1 tablet (2.5 mg total) by mouth 2 (two) times daily. 04/03/23  04/02/24 Yes Pokhrel, Laxman, MD  clopidogrel (PLAVIX) 75 MG tablet Take 1 tablet (75 mg total) by mouth daily. 04/03/23 04/02/24 Yes Pokhrel, Laxman, MD  melatonin 5 MG TABS Take 5 mg by mouth at bedtime as needed (for sleep).   Yes [provider]  Multiple Vitamin (MULTIVITAMIN WITH MINERALS) TABS tablet Take 1 tablet by mouth daily. 03/01/23  Yes Setzer, Lynnell Jude, PA-C  ondansetron (ZOFRAN-ODT) 4 MG disintegrating tablet Take 1 tablet (4 mg total) by mouth every 8 (eight) hours as needed for nausea or vomiting. 04/05/23  Yes [name], MD  rosuvastatin (CRESTOR) 10 MG tablet Take 1 tablet (10 mg total) by mouth daily. TAKE 1 TABLET(10 MG) BY MOUTH DAILY Strength: 10 mg Patient taking differently: Take 10 mg by mouth daily. 02/28/23  Yes Setzer, Lynnell Jude, PA-C  torsemide (DEMADEX) 20 MG tablet Take 2 tablets (40 mg total) by mouth daily. 04/17/23 05/17/23 Yes Arrien, York Ram, MD  traMADol (ULTRAM) 50 MG tablet Take 1 tablet (50 mg total) by mouth 2 (two) times daily. 02/28/23  Yes Setzer, Lynnell Jude, PA-C  triamcinolone cream (KENALOG) 0.1 % Apply 1 Application topically 2 (two) times daily as needed (for itching). Patient taking differently: Apply 1 Application topically 2 (two) times daily as needed (for itching). Apply to back for itching 04/17/23  Yes [name], MD  feeding supplement, GLUCERNA SHAKE, (GLUCERNA SHAKE) LIQD Take 237 mLs by mouth 2 (two) times daily between meals. Patient not taking: Reported on 04/21/2023 04/16/23 05/16/23  Arrien, [employer]  Reuel Boom, MD  glucose blood (ACCU-CHEK AVIVA PLUS) test strip USE TO TEST BLOOD SUGAR ONCE DAILY 03/23/23   [name], MD      Allergies    Chlorhexidine gluconate, Ezetimibe-simvastatin, Lipitor [atorvastatin], Claritin [loratadine], Spironolactone, Tamiflu [oseltamivir phosphate], Pravastatin sodium, and Sulfonamide derivatives    Review of Systems   Review of Systems  Constitutional:  Negative for chills and fever.   Respiratory:  Negative for shortness of breath.   Cardiovascular:  Negative for chest pain.  Gastrointestinal:  Negative for abdominal pain.  Neurological:  Negative for headaches.    Physical Exam Updated Vital Signs BP 126/89   Pulse 90   Temp 97.8 F (36.6 C) (Oral)   Resp 12   SpO2 96%  Physical Exam Vitals and nursing note reviewed.  Constitutional:      General: She is not in acute distress.    Appearance: She is not ill-appearing.  HENT:     Head: Normocephalic and atraumatic.     Nose: No congestion.  Eyes:     Conjunctiva/sclera: Conjunctivae normal.  Cardiovascular:     Rate and Rhythm: Normal rate and regular rhythm.     Pulses: Normal pulses.     Heart sounds: No murmur heard.    No friction rub. No gallop.  Pulmonary:     Effort: No respiratory distress.     Breath sounds: Rales present. No wheezing or rhonchi.     Comments: No evidence of respiratory distress, very minimal Rales present in the right lower lobe, no rhonchi or stridor present. Abdominal:     Palpations: Abdomen is soft.     Tenderness: There is no abdominal tenderness. There is no right CVA tenderness or left CVA tenderness.  Musculoskeletal:     Right lower leg: Edema present.     Left lower leg: Edema present.     Comments: Patient has noted edema in the lower limbs bilaterally, nonpitting.   Skin:    General: Skin is warm and dry.  Neurological:     Mental Status: She is alert.  Psychiatric:        Mood and Affect: Mood normal.     ED Results / Procedures / Treatments   Labs (all labs ordered are listed, but only abnormal results are displayed) Labs Reviewed  CBC - Abnormal; Notable for the following components:      Result Value   RDW 16.0 (*)    All other components within normal limits  COMPREHENSIVE METABOLIC PANEL - Abnormal; Notable for the following components:   Chloride 97 (*)    Glucose, Bld 153 (*)    BUN 52 (*)    Creatinine, Ser 3.24 (*)    ALT 86 (*)     Alkaline Phosphatase 158 (*)    GFR, Estimated 14 (*)    All other components within normal limits  BRAIN NATRIURETIC PEPTIDE - Abnormal; Notable for the following components:   B Natriuretic Peptide 2,204.9 (*)    All other components within normal limits  CBG MONITORING, ED - Abnormal; Notable for the following components:   Glucose-Capillary 149 (*)    All other components within normal limits  URINALYSIS, ROUTINE W REFLEX MICROSCOPIC    EKG EKG Interpretation  Date/Time:  Thursday Apr 20 2023 23:57:36 EDT Ventricular Rate:  71 PR Interval:  276 QRS Duration: 138 QT Interval:  427 QTC Calculation: 461 R Axis:   187 Text Interpretation: Atrial-paced complexes Ventricular premature complex Prolonged PR interval RBBB and LPFB  Inferior infarct, old Anterolateral infarct, age indeterminate Confirmed by Gilda Crease [id number]) on 04/21/2023 12:01:34 AM  Radiology DG Chest 2 View  Result Date: 04/20/2023 CLINICAL DATA:  Shortness of breath EXAM: CHEST - 2 VIEW COMPARISON:  04/08/2023 FINDINGS: Cardiac shadow is enlarged. Defibrillator is again noted. Aortic calcifications are seen. Previously noted right central venous catheter has been removed. Lungs are clear. No acute bony abnormality is noted. IMPRESSION: Stable cardiomegaly.  No acute abnormality noted. Electronically Signed   By: Alcide Clever M.D.   On: 04/20/2023 23:31    Procedures Procedures    Medications Ordered in ED Medications - No data to display  ED Course/ Medical Decision Making/ A&P                             Medical Decision Making Amount and/or Complexity of Data Reviewed Labs: ordered. Radiology: ordered.  Risk Decision regarding hospitalization.   This patient presents to the ED for concern of abnormal lab , this involves an extensive number of treatment options, and is a complaint that carries with it a high risk of complications and morbidity.  The differential diagnosis includes CHF,  worsening heart failure, UTI, sepsis    Additional history obtained:  Additional history obtained from husband at bedside External records from outside source obtained and reviewed including recent cardiology notes   Co morbidities that complicate the patient evaluation  A-fib currently on Eliquis, congestive heart failure,  Social Determinants of Health:  Geriatric    Lab Tests:  I Ordered, and personally interpreted labs.  The pertinent results include: CBC unremarkable, CMP reveals CO2 97, glucose 153, BUN 52, creatinine 3.2, baseline appears to be around 1.9, alk phos 158, GFR 14,   Imaging Studies ordered:  I ordered imaging studies including chest x-ray I independently visualized and interpreted imaging which showed cardiomegaly I agree with the radiologist interpretation   Cardiac Monitoring:  The patient was maintained on a cardiac monitor.  I personally viewed and interpreted the cardiac monitored which showed an underlying rhythm of: Without signs of ischemia   Medicines ordered and prescription drug management:  I ordered medication including N/A I have reviewed the patients home medicines and have made adjustments as needed  Critical Interventions:  N/A   Reevaluation:  Presents with abnormal labs, triage obtain basic lab workup which I personally reviewed shows an elevated creatinine, will add on chest x-ray BNP for further evaluation  Chest x-ray and physical exam are consistent do not feel patient volume overload at this time, but due to her increasing kidney function I do recommend admission she is given this plan will admit to medicine.  Consultations Obtained:  I requested consultation with the Dr. Norm Parcel,  and discussed lab and imaging findings as well as pertinent plan - they recommend: She will admit the patient    Test Considered:  N/A    Rule out I have low suspicion for ACS patient endorsing chest pain shortness of breath,  EKG without signs of ischemia..  Low suspicion for PE as she has no new oxygen requirements denies  chest pain shortness of breath, she is on Eliquis not miss any doses.  Low suspicion for systemic infection as patient is nontoxic-appearing, vital signs reassuring, no obvious source infection noted on exam.  Suspicion for CHF is lower at this time as she does not appear to be volume overloaded my exam, chest x-ray negative for pleural effusions, she does  not have significant pitting edema patient will states her swelling is at her baseline.  Will hold off on diuretics.     Dispostion and problem list  After consideration of the diagnostic results and the patients response to treatment, I feel that the patent would benefit from admission.   AKI-unclear etiology, patient will need likely formal consultation by nephrology for further evaluation patiently close monitoring.            Final Clinical Impression(s) / ED Diagnoses Final diagnoses:  AKI (acute kidney injury) [facility])    Rx / DC Orders ED Discharge Orders     None         Carroll Sage, PA-C 04/21/23 0231    Gilda Crease, MD 04/22/23 [phone]

## 2023-04-21 ENCOUNTER — Encounter (HOSPITAL_COMMUNITY): Payer: Self-pay | Admitting: Internal Medicine

## 2023-04-21 ENCOUNTER — Observation Stay (HOSPITAL_COMMUNITY): Payer: Medicare Other

## 2023-04-21 DIAGNOSIS — N179 Acute kidney failure, unspecified: Secondary | ICD-10-CM

## 2023-04-21 DIAGNOSIS — Z7189 Other specified counseling: Secondary | ICD-10-CM | POA: Diagnosis not present

## 2023-04-21 DIAGNOSIS — I5021 Acute systolic (congestive) heart failure: Secondary | ICD-10-CM | POA: Diagnosis not present

## 2023-04-21 DIAGNOSIS — I255 Ischemic cardiomyopathy: Secondary | ICD-10-CM | POA: Diagnosis present

## 2023-04-21 DIAGNOSIS — H903 Sensorineural hearing loss, bilateral: Secondary | ICD-10-CM | POA: Diagnosis present

## 2023-04-21 DIAGNOSIS — R0609 Other forms of dyspnea: Secondary | ICD-10-CM | POA: Diagnosis not present

## 2023-04-21 DIAGNOSIS — I10 Essential (primary) hypertension: Secondary | ICD-10-CM | POA: Diagnosis not present

## 2023-04-21 DIAGNOSIS — E1151 Type 2 diabetes mellitus with diabetic peripheral angiopathy without gangrene: Secondary | ICD-10-CM | POA: Diagnosis present

## 2023-04-21 DIAGNOSIS — I272 Pulmonary hypertension, unspecified: Secondary | ICD-10-CM | POA: Diagnosis not present

## 2023-04-21 DIAGNOSIS — I130 Hypertensive heart and chronic kidney disease with heart failure and stage 1 through stage 4 chronic kidney disease, or unspecified chronic kidney disease: Secondary | ICD-10-CM | POA: Diagnosis present

## 2023-04-21 DIAGNOSIS — Z95810 Presence of automatic (implantable) cardiac defibrillator: Secondary | ICD-10-CM | POA: Diagnosis not present

## 2023-04-21 DIAGNOSIS — Z79899 Other long term (current) drug therapy: Secondary | ICD-10-CM | POA: Diagnosis not present

## 2023-04-21 DIAGNOSIS — I2721 Secondary pulmonary arterial hypertension: Secondary | ICD-10-CM | POA: Diagnosis present

## 2023-04-21 DIAGNOSIS — I5082 Biventricular heart failure: Secondary | ICD-10-CM | POA: Diagnosis present

## 2023-04-21 DIAGNOSIS — R570 Cardiogenic shock: Secondary | ICD-10-CM | POA: Diagnosis not present

## 2023-04-21 DIAGNOSIS — I480 Paroxysmal atrial fibrillation: Secondary | ICD-10-CM | POA: Diagnosis present

## 2023-04-21 DIAGNOSIS — I5023 Acute on chronic systolic (congestive) heart failure: Secondary | ICD-10-CM

## 2023-04-21 DIAGNOSIS — R531 Weakness: Secondary | ICD-10-CM | POA: Diagnosis not present

## 2023-04-21 DIAGNOSIS — I420 Dilated cardiomyopathy: Secondary | ICD-10-CM | POA: Diagnosis present

## 2023-04-21 DIAGNOSIS — Z515 Encounter for palliative care: Secondary | ICD-10-CM | POA: Diagnosis not present

## 2023-04-21 DIAGNOSIS — E1165 Type 2 diabetes mellitus with hyperglycemia: Secondary | ICD-10-CM | POA: Diagnosis present

## 2023-04-21 DIAGNOSIS — G8929 Other chronic pain: Secondary | ICD-10-CM | POA: Diagnosis present

## 2023-04-21 DIAGNOSIS — I428 Other cardiomyopathies: Secondary | ICD-10-CM | POA: Diagnosis present

## 2023-04-21 DIAGNOSIS — I5020 Unspecified systolic (congestive) heart failure: Secondary | ICD-10-CM | POA: Diagnosis not present

## 2023-04-21 DIAGNOSIS — E872 Acidosis, unspecified: Secondary | ICD-10-CM | POA: Diagnosis present

## 2023-04-21 DIAGNOSIS — E876 Hypokalemia: Secondary | ICD-10-CM | POA: Diagnosis present

## 2023-04-21 DIAGNOSIS — E1122 Type 2 diabetes mellitus with diabetic chronic kidney disease: Secondary | ICD-10-CM | POA: Diagnosis present

## 2023-04-21 DIAGNOSIS — I2510 Atherosclerotic heart disease of native coronary artery without angina pectoris: Secondary | ICD-10-CM | POA: Diagnosis not present

## 2023-04-21 DIAGNOSIS — I5043 Acute on chronic combined systolic (congestive) and diastolic (congestive) heart failure: Secondary | ICD-10-CM | POA: Diagnosis present

## 2023-04-21 DIAGNOSIS — N1832 Chronic kidney disease, stage 3b: Secondary | ICD-10-CM

## 2023-04-21 DIAGNOSIS — I4729 Other ventricular tachycardia: Secondary | ICD-10-CM | POA: Diagnosis present

## 2023-04-21 DIAGNOSIS — Z66 Do not resuscitate: Secondary | ICD-10-CM | POA: Diagnosis present

## 2023-04-21 DIAGNOSIS — I5022 Chronic systolic (congestive) heart failure: Secondary | ICD-10-CM | POA: Diagnosis not present

## 2023-04-21 DIAGNOSIS — E785 Hyperlipidemia, unspecified: Secondary | ICD-10-CM | POA: Diagnosis present

## 2023-04-21 DIAGNOSIS — I051 Rheumatic mitral insufficiency: Secondary | ICD-10-CM | POA: Diagnosis present

## 2023-04-21 DIAGNOSIS — I495 Sick sinus syndrome: Secondary | ICD-10-CM | POA: Diagnosis present

## 2023-04-21 LAB — BASIC METABOLIC PANEL
Anion gap: 14 (ref 5–15)
BUN: 52 mg/dL — ABNORMAL HIGH (ref 8–23)
CO2: 29 mmol/L (ref 22–32)
Calcium: 9.5 mg/dL (ref 8.9–10.3)
Chloride: 96 mmol/L — ABNORMAL LOW (ref 98–111)
Creatinine, Ser: 3.02 mg/dL — ABNORMAL HIGH (ref 0.44–1.00)
GFR, Estimated: 15 mL/min — ABNORMAL LOW (ref >60)
Glucose, Bld: 118 mg/dL — ABNORMAL HIGH (ref 70–99)
Potassium: 3.6 mmol/L (ref 3.5–5.1)
Sodium: 139 mmol/L (ref 135–145)

## 2023-04-21 LAB — GLUCOSE, CAPILLARY
Glucose-Capillary: 145 mg/dL — ABNORMAL HIGH (ref 70–99)
Glucose-Capillary: 151 mg/dL — ABNORMAL HIGH (ref 70–99)
Glucose-Capillary: 139 mg/dL — ABNORMAL HIGH (ref 70–99)
Glucose-Capillary: 123 mg/dL — ABNORMAL HIGH (ref 70–99)
Glucose-Capillary: 110 mg/dL — ABNORMAL HIGH (ref 70–99)

## 2023-04-21 LAB — CREATININE, URINE, RANDOM: Creatinine, Urine: 143 mg/dL

## 2023-04-21 LAB — LACTIC ACID, PLASMA: Lactic Acid, Venous: 2.8 mmol/L (ref 0.5–1.9)

## 2023-04-21 LAB — BRAIN NATRIURETIC PEPTIDE: B Natriuretic Peptide: 2204.9 pg/mL — ABNORMAL HIGH (ref 0.0–100.0)

## 2023-04-21 LAB — SODIUM, URINE, RANDOM: Sodium, Ur: <10 mmol/L

## 2023-04-21 MED ORDER — MILRINONE LACTATE IN DEXTROSE 20-5 MG/100ML-% IV SOLN
0.2500 ug/kg/min | INTRAVENOUS | Status: DC
  Administered 2023-04-21 – 2023-04-30 (×13): 0.25 ug/kg/min via INTRAVENOUS
  Filled 2023-04-21 (×12): qty 100

## 2023-04-21 MED ORDER — ACETAMINOPHEN 650 MG RE SUPP: 650.0000 mg | Freq: Four times a day (QID) | RECTAL | Status: DC | PRN

## 2023-04-21 MED ORDER — POLYETHYLENE GLYCOL 3350 17 G PO PACK
17.0000 g | PACK | Freq: Every day | ORAL | Status: DC
  Administered 2023-04-21 – 2023-04-23 (×3): 17 g via ORAL
  Filled 2023-04-21 (×3): qty 1

## 2023-04-21 MED ORDER — BISACODYL 10 MG RE SUPP: 10.0000 mg | Freq: Every day | RECTAL | Status: DC | PRN

## 2023-04-21 MED ORDER — AMIODARONE HCL 200 MG PO TABS
200.0000 mg | ORAL_TABLET | Freq: Every day | ORAL | Status: DC
  Administered 2023-04-21 – 2023-04-30 (×10): 200 mg via ORAL
  Filled 2023-04-21 (×10): qty 1

## 2023-04-21 MED ORDER — ROSUVASTATIN CALCIUM 5 MG PO TABS
10.0000 mg | ORAL_TABLET | Freq: Every day | ORAL | Status: DC
  Administered 2023-04-21 – 2023-05-01 (×11): 10 mg via ORAL
  Filled 2023-04-21 (×11): qty 2

## 2023-04-21 MED ORDER — ACETAMINOPHEN 325 MG PO TABS
650.0000 mg | ORAL_TABLET | Freq: Four times a day (QID) | ORAL | Status: DC | PRN
  Administered 2023-04-28 – 2023-04-30 (×4): 650 mg via ORAL
  Filled 2023-04-21 (×4): qty 2

## 2023-04-21 MED ORDER — CLOPIDOGREL BISULFATE 75 MG PO TABS
75.0000 mg | ORAL_TABLET | Freq: Every day | ORAL | Status: DC
  Administered 2023-04-21 – 2023-05-01 (×11): 75 mg via ORAL
  Filled 2023-04-21 (×11): qty 1

## 2023-04-21 MED ORDER — APIXABAN 2.5 MG PO TABS
2.5000 mg | ORAL_TABLET | Freq: Two times a day (BID) | ORAL | Status: DC
  Administered 2023-04-21 – 2023-05-01 (×21): 2.5 mg via ORAL
  Filled 2023-04-21 (×21): qty 1

## 2023-04-21 MED ORDER — INSULIN ASPART 100 UNIT/ML IJ SOLN
0.0000 [IU] | Freq: Three times a day (TID) | INTRAMUSCULAR | Status: DC
  Administered 2023-04-21 (×2): 1 [IU] via SUBCUTANEOUS
  Administered 2023-04-22: 3 [IU] via SUBCUTANEOUS
  Administered 2023-04-22: 1 [IU] via SUBCUTANEOUS
  Administered 2023-04-23: 3 [IU] via SUBCUTANEOUS
  Administered 2023-04-23 – 2023-04-24 (×2): 1 [IU] via SUBCUTANEOUS
  Administered 2023-04-24: 2 [IU] via SUBCUTANEOUS
  Administered 2023-04-25 – 2023-04-26 (×2): 3 [IU] via SUBCUTANEOUS
  Administered 2023-04-26: 1 [IU] via SUBCUTANEOUS
  Administered 2023-04-27: 5 [IU] via SUBCUTANEOUS
  Administered 2023-04-28: 1 [IU] via SUBCUTANEOUS
  Administered 2023-04-28 – 2023-04-29 (×2): 3 [IU] via SUBCUTANEOUS
  Administered 2023-04-29: 2 [IU] via SUBCUTANEOUS
  Administered 2023-04-30: 1 [IU] via SUBCUTANEOUS
  Administered 2023-04-30: 5 [IU] via SUBCUTANEOUS
  Administered 2023-05-01: 2 [IU] via SUBCUTANEOUS

## 2023-04-21 MED ORDER — TRAMADOL HCL 50 MG PO TABS
50.0000 mg | ORAL_TABLET | Freq: Two times a day (BID) | ORAL | Status: DC
  Administered 2023-04-21 – 2023-05-01 (×21): 50 mg via ORAL
  Filled 2023-04-21 (×21): qty 1

## 2023-04-21 MED ORDER — FUROSEMIDE 10 MG/ML IJ SOLN
80.0000 mg | Freq: Two times a day (BID) | INTRAMUSCULAR | Status: DC
  Administered 2023-04-22 – 2023-04-24 (×4): 80 mg via INTRAVENOUS
  Filled 2023-04-21 (×6): qty 8

## 2023-04-21 NOTE — Progress Notes (Signed)
OT Cancellation Note  Patient Details Name: Natasha Woodard MRN: [id number] DOB: 09-18-1941   Cancelled Treatment:    Reason Eval/Treat Not Completed: Patient declined, no reason specified (Pt respectfully declined therapy today. OT will continue to f/u with patient as able)   04/21/2023  AB, OTR/L  Acute Rehabilitation Services  Office: [phone]   Tristan Schroeder 04/21/2023, 5:34 PM

## 2023-04-21 NOTE — ED Notes (Signed)
DNR bracelet applied to patient. 

## 2023-04-21 NOTE — ED Notes (Signed)
Patient denies having to use the bathroom at this time.

## 2023-04-21 NOTE — Progress Notes (Signed)
Heart Failure Navigator Progress Note  Assessed for Heart & Vascular TOC clinic readiness.  Patient does not meet criteria due to Advanced Heart Failure Team patient.   Navigator will sign off at this time.    Sabrine Patchen, BSN, RN Heart Failure Nurse Navigator Secure Chat Only   

## 2023-04-21 NOTE — Evaluation (Signed)
Physical Therapy Evaluation Patient Details Name: Natasha Woodard MRN: [id number] DOB: 04/16/1941 Today's Date: 04/21/2023  History of Present Illness  82 y.o. female presents to [facility] hospital on 04/20/2023, referred by PCP due to concern fro AKI. Pt was recently admitted 4/24-5/5 for decompensated CHF, AKI, sepsis 2/2 UTI. PMH: NSTEMI and cardiogenic shock with a complicated and prolonged admission 1/30-3/10/24,  HFrEF, A-fib, AKI on CKD IVOSA, HTN, HLD  Clinical Impression  Pt presents to PT with deficits in activity tolerance and cardiopulmonary function. Pt is able to ambulate well with support of rollator, but reports DOE when mobilizing. Pt sats 93% upon completion of ambulation, PT is unable to obtain reading during ambulation. PT encourages frequent mobilization this admission, and pt is agreeable to this. PT recommends discharge home when medically ready.       Recommendations for follow up therapy are one component of a multi-disciplinary discharge planning process, led by the attending physician.  Recommendations may be updated based on patient status, additional functional criteria and insurance authorization.  Follow Up Recommendations       Assistance Recommended at Discharge Intermittent Supervision/Assistance  Patient can return home with the following  A little help with bathing/dressing/bathroom;Assistance with cooking/housework;Assist for transportation;Help with stairs or ramp for entrance    Equipment Recommendations None recommended by PT  Recommendations for Other Services       Functional Status Assessment Patient has had a recent decline in their functional status and demonstrates the ability to make significant improvements in function in a reasonable and predictable amount of time.     Precautions / Restrictions Precautions Precautions: Fall;Other (comment) Precaution Comments: monitor SpO2 Restrictions Weight Bearing Restrictions: No      Mobility  Bed  Mobility Overal bed mobility: Modified Independent                  Transfers Overall transfer level: Modified independent Equipment used: Rollator (4 wheels) Transfers: Sit to/from Stand Sit to Stand: Modified independent (Device/Increase time)                Ambulation/Gait Ambulation/Gait assistance: Supervision Gait Distance (Feet): 150 Feet Assistive device: Rollator (4 wheels) Gait Pattern/deviations: Step-through pattern Gait velocity: Decreased Gait velocity interpretation: 1.31 - 2.62 ft/sec, indicative of limited community ambulator   General Gait Details: steady step-through gait, pt reports 3/4 DOE but remains able to converse when ambulating  Careers information officer    Modified Rankin (Stroke Patients Only)       Balance Overall balance assessment: Needs assistance Sitting-balance support: No upper extremity supported, Feet supported Sitting balance-Leahy Scale: Good     Standing balance support: Single extremity supported, Reliant on assistive device for balance Standing balance-Leahy Scale: Poor                               Pertinent Vitals/Pain Pain Assessment Pain Assessment: No/denies pain    Home Living Family/patient expects to be discharged to:: Private residence Living Arrangements: Spouse/significant other Available Help at Discharge: Family;Available 24 hours/day Type of Home: House Home Access: Stairs to enter Entrance Stairs-Rails: Right;Left Entrance Stairs-Number of Steps: 2 Alternate Level Stairs-Number of Steps: 14 Home Layout: Two level;Bed/bath upstairs Home Equipment: Rollator (4 wheels);Cane - single point      Prior Function Prior Level of Function : Independent/Modified Independent;Driving  Mobility Comments: uses Rollator       Hand Dominance   Dominant Hand: Right    Extremity/Trunk Assessment   Upper Extremity Assessment Upper Extremity Assessment:  Overall WFL for tasks assessed    Lower Extremity Assessment Lower Extremity Assessment: RLE deficits/detail;LLE deficits/detail RLE Deficits / Details: BLE edema LLE Deficits / Details: BLE edema    Cervical / Trunk Assessment Cervical / Trunk Assessment: Kyphotic  Communication   Communication: No difficulties  Cognition Arousal/Alertness: Awake/alert Behavior During Therapy: WFL for tasks assessed/performed Overall Cognitive Status: Within Functional Limits for tasks assessed                                          General Comments General comments (skin integrity, edema, etc.): pt on 2L [place] upon PT arrival, weaned to room air for ambulation. PT unable to obtain sat reading when ambulating, sats at 93% upon completing ambulation. Pt reports 3/4 DOE during ambulation    Exercises     Assessment/Plan    PT Assessment Patient needs continued PT services  PT Problem List Decreased activity tolerance;Cardiopulmonary status limiting activity;Decreased balance;Decreased mobility       PT Treatment Interventions DME instruction;Gait training;Functional mobility training;Therapeutic activities;Therapeutic exercise;Balance training;Patient/family education    PT Goals (Current goals can be found in the Care Plan section)  Acute Rehab PT Goals Patient Stated Goal: to go home PT Goal Formulation: With patient Time For Goal Achievement: 05/05/23 Potential to Achieve Goals: Good Additional Goals Additional Goal #1: Pt will ambulate for >500' to demonstrate improved activity tolerance Additional Goal #2: Pt will report 1/4 DOE or less to demonstrate improved activity tolerance when ambulating    Frequency Min 2X/week     Co-evaluation               AM-PAC PT "6 Clicks" Mobility  Outcome Measure Help needed turning from your back to your side while in a flat bed without using bedrails?: None Help needed moving from lying on your back to sitting on the  side of a flat bed without using bedrails?: None Help needed moving to and from a bed to a chair (including a wheelchair)?: None Help needed standing up from a chair using your arms (e.g., wheelchair or bedside chair)?: None Help needed to walk in hospital room?: A Little Help needed climbing 3-5 steps with a railing? : A Little 6 Click Score: 22    End of Session   Activity Tolerance: Patient tolerated treatment well Patient left: in bed;with call bell/phone within reach;with family/visitor present Nurse Communication: Mobility status PT Visit Diagnosis: Other abnormalities of gait and mobility (R26.89)    Time: 4098-1191 PT Time Calculation (min) (ACUTE ONLY): 26 min   Charges:   PT Evaluation $PT Eval Low Complexity: 1 Low          Arlyss Gandy, PT, DPT Acute Rehabilitation Office [phone]   Arlyss Gandy 04/21/2023, 12:58 PM

## 2023-04-21 NOTE — Hospital Course (Addendum)
Natasha Woodard was admitted to the hospital with the working diagnosis of hear failure exacerbation.    76 yof w/ significant complex medical history including chronic HFrEF (EF less than 20%), moderate to severe MR, CAD status post PCI 12/2022, hypertension, hyperlipidemia, paroxysmal A-fib on Eliquis, history of VT arrest, status post ICD, CKD stage IIIb, pseudoaneurysm of right groin.  recently admitted 4/24-5/5 for decompensated CHF, AKI, and septic shock secondary to UTI who was discharged on torsemide 40 mg daily seen by PCP 5/9 and sent to the ED due to concern for AKI.    Patient complained of shortness of breath exertion since discharge, does not think she has gotten any worse, has been taking torsemide, feels her lower extremity edema has improved.She is not on RAAS inhibition or beta-blocker due to low output heart failure/risk of hypotension and not on SGLT2 inhibitor due to recent groin infection. In the ED, her blood pressure was 136/72, HR 73, RR 18 and 02 saturation 95%, lungs with no wheezing or rales, heart with S1 and S2 present and rhythmic, abdomen with no distention and positive lower extremity edema.   Na 139, K 3,6 Cl 96, glucose 118, bun 52, cr 3,0  Lactic acid 2,8  Wbc 4,9 hgb 12,8 plt 268   Chest radiograph with cardiomegaly, bilateral hilar vascular congestion, small bilateral pleural effusions. Sternotomy wires in place. Pacemaker defibrillator in place with one atrial and one ventricular lead.   EKG 71 bpm, right axis deviation, right bundle branch block, atrial paced and ventricular sensing, one ventricular paced beat, qtc 461, with no significant ST segment or T wave changes.   05/16 patient was placed on milrinone drip and furosemide drip.  05/17 volume status is improving, transitioned to oral torsemide.   05/18 patient has been stable on milrinone infusion. Plan for possible discharge home in 48 hrs with palliative care home inotropic infusion.  05/19 patient  having VT 170 to 200 bpm, loss of consciousness x2, recovering after defibrillator firing.  Changed to IV amiodarone.  05/20 no further VT episodes, patient will go home today with palliative inotropic support, if continue ICD firing may need to transition to comfort care.

## 2023-04-21 NOTE — Progress Notes (Signed)
Responded to consult for IV. Received report that family member voiced concern over additional site; stated that he did not want pt to be stuck again. Arrived to room to discuss need for IV. During introduction, pt stated she needed to use the restroom and couldn't wait. Advised pt to wait for assistance from staff; family member stated he would assist pt. Informed RN.

## 2023-04-21 NOTE — Progress Notes (Signed)
Long discussion held with the patient, her sister and her husband. Discussed my concern about her prognosis given severe systolic dysfunction, RV dysfunction, severe pulmonary HTN and severe MR with frequent re-hospitalizations. Unfortunately, despite taking her medications as prescribed, it appears she is in a low output state with rising Cr and elevated lactate. I am concerned that her condition is not reversible and she has limited therapeutic options. Discussed the idea of palliative care consultation to help navigate things going forward and the patient seemed open to the idea. Her husband would like to continue a trial of milrinone and the patient agrees to this for now but will continue ongoing goals of care discussions going forward.   Total time of encounter: 75 minutes total time of encounter, including 75 minutes spent in face-to-face patient care on the date of this encounter. This time includes coordination of care and counseling regarding above mentioned problem list. Remainder of non-face-to-face time involved reviewing chart documents/testing relevant to the patient encounter and documentation in the medical record. I have independently reviewed documentation from referring provider.   Laurance Flatten, MD [place]  [facility]

## 2023-04-21 NOTE — H&P (Signed)
History and Physical    BRANDYCE SALMI ZOX:[id number] DOB: [birth year] DOA: 04/20/2023  PCP: [name], MD  Patient coming from: Home  Chief Complaint: Abnormal lab  HPI: LATRISHA TONKINSON is a 82 y.o. female with medical history significant of chronic HFrEF (EF less than 20%), moderate to severe MR, CAD status post PCI 12/2022, hypertension, hyperlipidemia, paroxysmal A-fib on Eliquis, history of VT arrest, status post ICD, CKD stage IIIb, pseudoaneurysm of right groin.  Recently admitted 4/24-5/5 for decompensated CHF, AKI, and septic shock secondary to UTI.  She was treated with antibiotics and IV diuresis.  Creatinine improved to 1.8 and she was discharged on torsemide 40 mg daily.  She is not on RAAS inhibition or beta-blocker due to low output heart failure/risk of hypotension and not on SGLT2 inhibitor due to recent groin infection.  Patient had a follow-up visit with her PCP yesterday and sent to the ED due to concern for AKI.    In the ED, vital signs stable.  Labs significant for BUN 52, creatinine 3.2, GFR 14, BNP 2204 (improved since labs done 2 weeks ago).  Chest x-ray not suggestive of pulmonary edema.  TRH called to admit.  Patient states she has continued to have shortness of breath with exertion since she left the hospital but does not think this has gotten any worse.  She is taking torsemide but does not know the dose.  She thinks her bilateral lower extremity edema has improved.  However, she is concerned that her weight has been fluctuating and she thinks she might have gained a few pounds.  No chest pain.  Patient states she was having vomiting and diarrhea when she was last admitted to the hospital but she is no longer having these symptoms.  No abdominal pain and no other complaints.  Review of Systems:  Review of Systems  All other systems reviewed and are negative.   Past Medical History:  Diagnosis Date   AICD (automatic cardioverter/defibrillator) present     Calcific tendonitis 2008   Treatment of left leg   Cardiomyopathy (HCC) 06/2017   a) Echo: EF 25-30%. GR 1-2 DD w/ elevated LVEDP. Mild valvular Dz; b) Cardiac MRI 2/'19: frequent PVCs (diffiuclt to interpret) - EF ~27% w/ diffuse HK.  No evidence of infarct, infiltrative Dz or myocarditis. -- ? if related to PVCs. c) f/u Echo 6/'19: EF 35-40%. Gr 1 DD. Diffuse HK.-> d) Jan 2020 EF 50 to 55%.  Mod MAC, mod LAE.  Ao Sclerosis; e) Echo 1/'21 - EF 60-65%, Gr II DD. Mild Mod MR.    Chronic combined systolic and diastolic CHF, NYHA class 2 and ACA/AHA stage C    Now essentially resolved, back to simply diastolic CHF   Coronary artery disease, non-occlusive    mild-moderate CAD 06/30/17 cath   CTS (carpal tunnel syndrome)    Diabetes mellitus    Type II   Dysrhythmia    patient said that she cant remember what it is   Frequent unifocal PVCs 01/2018   Event Monitor: NSR, W/ frequent multifocal PVCs (9%-down from 30% prior to amiodarone) and PACs.  Nighttime bradycardia suggestive of OSA.';  Zio patch September 2023: PVC burden now 6.1%.   Hiatal hernia    with reflux   Hyperlipidemia    Statin intolerant   Hypertension    Ichthyosis congenita    Intermittent claudication of both lower extremities due to atherosclerosis (HCC) 08/22/2021   LEA Dopplers 09/09/2021:  Right: Total occlusion noted  in the superficial femoral artery. Atherosclerosis noted throughout extremity, see note above. Three vessel runoff.  Left: Total occlusion noted in the superficial femoral artery and/or popliteal artery. Total occlusion noted in the distal anterior tibial artery. Athereosclerosis noted throughout extremity.    NSVD (normal spontaneous vaginal delivery) 1971 & 1972   Obesity    Plantar fasciitis    Left   Presence of permanent cardiac pacemaker    Pulmonary nodule    Imaged multiple times and benign appearing   Sleep apnea    Wears glasses     Past Surgical History:  Procedure Laterality Date    ABDOMINAL HYSTERECTOMY  1975   TAH-BSO   ABSCESS DRAINAGE  1972   right breast   APPLICATION OF WOUND VAC Right 01/15/2023   Procedure: APPLICATION OF INCISIONAL WOUND VAC;  Surgeon: Victorino Sparrow, MD;  Location: [facility] OR;  Service: Vascular;  Laterality: Right;   APPLICATION OF WOUND VAC Right 01/26/2023   Procedure: APPLICATION OF WOUND VAC RIGHT GROIN WASHOUT AND SARTORIUS MUSCLE FLAP;  Surgeon: Victorino Sparrow, MD;  Location: [facility] OR;  Service: Vascular;  Laterality: Right;   ARTERY REPAIR Right 01/15/2023   Procedure: RIGHT PROFUNDA ARTERY REPAIR WITH 2000G MYRIAD MORCELLS;  Surgeon: Victorino Sparrow, MD;  Location: [facility] OR;  Service: Vascular;  Laterality: Right;   BREAST BIOPSY Left 01/14/2009   Stereo- Benign   CARDIAC MRI  01/2018   Difficult to interpret 2/2 PVCs. Normal LV size - EF ~27% with diffuse HK. Mild RV dilation - normal fxn. -- NO MYOCARDIAL LGI => no definitive evidence of prior MI, Infiltrative Dz or Myocarditis -- suspect NICM, possibly related to PVCs.   CARPAL TUNNEL RELEASE Right 07/02/2014   Procedure: RIGHT CARPAL TUNNEL RELEASE;  Surgeon: Nicki Reaper, MD;  Location: [facility];  Service: Orthopedics;  Laterality: Right;   CARPAL TUNNEL RELEASE Left 06/11/2015   Procedure: LEFT CARPAL TUNNEL RELEASE;  Surgeon: Cindee Salt, MD;  Location: [facility];  Service: Orthopedics;  Laterality: Left;  REGIONAL/FAB   COLONOSCOPY     corn removal  1968   right   CORONARY STENT INTERVENTION N/A 01/10/2023   Procedure: CORONARY STENT INTERVENTION;  Surgeon: Lennette Bihari, MD;  Location: [facility] INVASIVE CV LAB;  Service: Cardiovascular;  Laterality: N/A;   GROIN DEBRIDEMENT Right 02/06/2023   Procedure: RIGHT GROIN DEBRIDEMENT WITH WOUND VAC CHANGE;  Surgeon: Victorino Sparrow, MD;  Location: [facility] OR;  Service: Vascular;  Laterality: Right;   HEMATOMA EVACUATION Right 01/15/2023   Procedure: EVACUATION HEMATOMA RIGHT GROIN;  Surgeon: Victorino Sparrow, MD;   Location: [facility] OR;  Service: Vascular;  Laterality: Right;   Holter Monitor  06/2017   ~17,000 PVC beats - majority were singlets, some couplets. 44 brief 3-7 beat runs of NSVT. Also noted were less frequent PACs with 11 runs (longest 15 beats)   IABP INSERTION Right 01/10/2023   Procedure: IABP Insertion;  Surgeon: Lennette Bihari, MD;  Location: [facility] INVASIVE CV LAB;  Service: Cardiovascular;  Laterality: Right;   ICD IMPLANT N/A 01/23/2023   Procedure: ICD IMPLANT;  Surgeon: Marinus Maw, MD;  Location: [facility] INVASIVE CV LAB;  Service: Cardiovascular;  Laterality: N/A;   INCISION AND DRAINAGE OF WOUND Right 01/31/2023   Procedure: IRRIGATION AND DEBRIDEMENT RIGHT GROIN WOUND AND WOUND VAC CHANGE;  Surgeon: Victorino Sparrow, MD;  Location: [facility] OR;  Service: Vascular;  Laterality: Right;   OPEN REDUCTION INTERNAL FIXATION (ORIF) DISTAL  RADIAL FRACTURE Left 07/21/2017   Procedure: OPEN REDUCTION INTERNAL FIXATION (ORIF) LEFT DISTAL RADIAL FRACTURE;  Surgeon: Sheral Apley, MD;  Location: [facility] OR;  Service: Orthopedics;  Laterality: Left;   RIGHT/LEFT HEART CATH AND CORONARY ANGIOGRAPHY N/A 06/30/2017   Procedure: Right/Left Heart Cath and Coronary Angiography;  Surgeon: Marykay Lex, MD;  Location: [facility] INVASIVE CV LAB:  pRCA 55%, pCx 40%, OM1 45%, mCx 50%, D2 50% - LVEF 25-35%. Moderately elevated LVEDP(26 mmHg with PCWP 16 mmHg).  FICK CO/CI: 4.47/2.48. PA pressures 47/14 mmHg with a mean of 27 mm.   RIGHT/LEFT HEART CATH AND CORONARY ANGIOGRAPHY N/A 01/10/2023   Procedure: RIGHT/LEFT HEART CATH AND CORONARY ANGIOGRAPHY;  Surgeon: Lennette Bihari, MD;  Location: [facility] INVASIVE CV LAB;  Service: Cardiovascular;  Laterality: N/A;   ROTATOR CUFF REPAIR     left shoulder   TEMPORARY PACEMAKER N/A 01/19/2023   Procedure: TEMPORARY PACEMAKER;  Surgeon: Dolores Patty, MD;  Location: [facility] INVASIVE CV LAB;  Service: Cardiovascular;  Laterality: N/A;   TRANSTHORACIC ECHOCARDIOGRAM  06/2017   EF 25 and 30%. GR  1 DD. Mild diastolic dysfunction with elevated LVEDP. Mild valvular disease.   TRANSTHORACIC ECHOCARDIOGRAM  11'18, 6/'19    a) EF remains 30-35%.  Diffuse hypokinesis noted.  Severe LA dilation; b) Improved EF 35-40%.  Diffuse HK.  GR 1 DD.  Moderate TR.  Mild RV dilation.   TRANSTHORACIC ECHOCARDIOGRAM  01/28/2022   Follow-up echo: EF 50 to 55%.  No RWMA.  GRII DD.  Severe LA dilation.  Mild RA dilation with normal PAP and RAP.  Mild to moderate TR (no significant change noted)   Zio patch  08/2022   3-day: Currently NSR (HR range 47-91 with average 64 bpm) 1 F AVB with IVCD/BBB.  Rare isolated PACs (<1%).  Frequent PVCs (6.1% with rare couplets and triplets.  Also bigeminy.  2 atrial runs 8 beats 5 2 seconds.  No sustained arrhythmias.   ZIO Patch 14 d Event Monitor  10/2018   Results as below < 1% PVC.  (Notably improved after starting amiodarone)     reports that she quit smoking about 29 years ago. Her smoking use included cigarettes. She has never been exposed to tobacco smoke. She has never used smokeless tobacco. She reports that she does not drink alcohol and does not use drugs.  Allergies  Allergen Reactions   Chlorhexidine Gluconate Rash    Pt refuses CHG due to inflammation and rash   Ezetimibe-Simvastatin Other (See Comments)    REACTION: Muscle aches (side effect)   Lipitor [Atorvastatin] Other (See Comments)    Leg weakness    Claritin [Loratadine]     Possible cause of nightmares.     Spironolactone     Caution re: elevated potassium/creatinine   Tamiflu [Oseltamivir Phosphate] Other (See Comments)    nightmares   Pravastatin Sodium Other (See Comments)    REACTION: Muscle aches (side effect)   Sulfonamide Derivatives Nausea And Vomiting    Family History  Problem Relation Age of Onset   Heart disease Mother        s/p pacemaker   Diabetes Mother    Heart disease Father    Diabetes Father    Heart disease Brother        MI   Cancer Brother        Lung    Hypertension Other    Colon cancer Neg Hx    Breast cancer Neg Hx  Prior to Admission medications   Medication Sig Start Date End Date Taking? Authorizing Provider  acetaminophen (TYLENOL) 325 MG tablet Take 1-2 tablets (325-650 mg total) by mouth every 6 (six) hours as needed. Patient taking differently: Take 325-650 mg by mouth in the morning and at bedtime. 02/28/23  Yes Setzer, Lynnell Jude, PA-C  amiodarone (PACERONE) 200 MG tablet Take 1 tablet (200 mg total) by mouth daily. 04/03/23 07/02/23 Yes Pokhrel, Rebekah Chesterfield, MD  apixaban (ELIQUIS) 2.5 MG TABS tablet Take 1 tablet (2.5 mg total) by mouth 2 (two) times daily. 04/03/23 04/02/24 Yes Pokhrel, Laxman, MD  clopidogrel (PLAVIX) 75 MG tablet Take 1 tablet (75 mg total) by mouth daily. 04/03/23 04/02/24 Yes Pokhrel, Laxman, MD  melatonin 5 MG TABS Take 5 mg by mouth at bedtime as needed (for sleep).   Yes [provider]  Multiple Vitamin (MULTIVITAMIN WITH MINERALS) TABS tablet Take 1 tablet by mouth daily. 03/01/23  Yes Setzer, Lynnell Jude, PA-C  ondansetron (ZOFRAN-ODT) 4 MG disintegrating tablet Take 1 tablet (4 mg total) by mouth every 8 (eight) hours as needed for nausea or vomiting. 04/05/23  Yes [name], MD  rosuvastatin (CRESTOR) 10 MG tablet Take 1 tablet (10 mg total) by mouth daily. TAKE 1 TABLET(10 MG) BY MOUTH DAILY Strength: 10 mg Patient taking differently: Take 10 mg by mouth daily. 02/28/23  Yes Setzer, Lynnell Jude, PA-C  torsemide (DEMADEX) 20 MG tablet Take 2 tablets (40 mg total) by mouth daily. 04/17/23 05/17/23 Yes Arrien, York Ram, MD  traMADol (ULTRAM) 50 MG tablet Take 1 tablet (50 mg total) by mouth 2 (two) times daily. 02/28/23  Yes Setzer, Lynnell Jude, PA-C  triamcinolone cream (KENALOG) 0.1 % Apply 1 Application topically 2 (two) times daily as needed (for itching). Patient taking differently: Apply 1 Application topically 2 (two) times daily as needed (for itching). Apply to back for itching 04/17/23  Yes [name], MD  feeding supplement, GLUCERNA SHAKE, (GLUCERNA SHAKE) LIQD Take 237 mLs by mouth 2 (two) times daily between meals. Patient not taking: Reported on 04/21/2023 04/16/23 05/16/23  Arrien, York Ram, MD  glucose blood (ACCU-CHEK AVIVA PLUS) test strip USE TO TEST BLOOD SUGAR ONCE DAILY 03/23/23   [name], MD    Physical Exam: Vitals:   04/20/23 1733 04/20/23 2314 04/21/23 0200 04/21/23 0258  BP: 136/72 132/87 126/89   Pulse: 70 73 90   Resp: 18 20 12    Temp: 97.9 F (36.6 C) 97.8 F (36.6 C)  98.1 F (36.7 C)  TempSrc: Oral Oral  Oral  SpO2: 95% 96% 96%     Physical Exam Vitals reviewed.  Constitutional:      General: She is not in acute distress. HENT:     Head: Normocephalic and atraumatic.  Eyes:     Extraocular Movements: Extraocular movements intact.  Cardiovascular:     Rate and Rhythm: Normal rate and regular rhythm.     Pulses: Normal pulses.  Pulmonary:     Effort: Pulmonary effort is normal. No respiratory distress.     Breath sounds: Normal breath sounds. No wheezing or rales.  Abdominal:     General: Bowel sounds are normal. There is no distension.     Palpations: Abdomen is soft.     Tenderness: There is no abdominal tenderness.  Musculoskeletal:     Cervical back: Normal range of motion.     Right lower leg: Edema present.     Left lower leg: Edema present.  Skin:    General: Skin is warm and dry.  Neurological:     General: No focal deficit present.     Mental Status: She is alert and oriented to person, place, and time.     Labs on Admission: I have personally reviewed following labs and imaging studies  CBC: Recent Labs  Lab 04/20/23 1325 04/20/23 1758  WBC 4.6 4.9  NEUTROABS 3.1  --   HGB 12.5 12.8  HCT 39.3 40.8  MCV 91.4 93.4  PLT 271.0 268   Basic Metabolic Panel: Recent Labs  Lab 04/15/23 0138 04/16/23 0116 04/20/23 1325 04/20/23 1758  NA 142 140 142 140  K 3.6 4.1 4.5 3.7  CL 97* 101 95* 97*  CO2 32 30  33* 29  GLUCOSE 124* 109* 155* 153*  BUN 43* 44* 55* 52*  CREATININE 1.90* 1.83* 3.10* 3.24*  CALCIUM 9.4 9.4 10.1 9.6  MG  --  1.8  --   --    GFR: Estimated Creatinine Clearance: 12.5 mL/min (A) (by C-G formula based on SCr of 3.24 mg/dL (H)). Liver Function Tests: Recent Labs  Lab 04/20/23 1325 04/20/23 1758  AST 28 30  ALT 84* 86*  ALKPHOS 158* 158*  BILITOT 1.0 1.0  PROT 7.0 7.0  ALBUMIN 3.9 3.6   No results for input(s): "LIPASE", "AMYLASE" in the last 168 hours. No results for input(s): "AMMONIA" in the last 168 hours. Coagulation Profile: No results for input(s): "INR", "PROTIME" in the last 168 hours. Cardiac Enzymes: No results for input(s): "CKTOTAL", "CKMB", "CKMBINDEX", "TROPONINI" in the last 168 hours. BNP (last 3 results) Recent Labs    06/09/22 1506  PROBNP 4,638*   HbA1C: No results for input(s): "HGBA1C" in the last 72 hours. CBG: Recent Labs  Lab 04/15/23 1618 04/15/23 2105 04/16/23 0615 04/16/23 1254 04/20/23 1856  GLUCAP 163* 113* 103* 167* 149*   Lipid Profile: No results for input(s): "CHOL", "HDL", "LDLCALC", "TRIG", "CHOLHDL", "LDLDIRECT" in the last 72 hours. Thyroid Function Tests: No results for input(s): "TSH", "T4TOTAL", "FREET4", "T3FREE", "THYROIDAB" in the last 72 hours. Anemia Panel: No results for input(s): "VITAMINB12", "FOLATE", "FERRITIN", "TIBC", "IRON", "RETICCTPCT" in the last 72 hours. Urine analysis:    Component Value Date/Time   COLORURINE YELLOW 04/06/2023 1304   APPEARANCEUR CLEAR 04/06/2023 1304   LABSPEC 1.025 04/06/2023 1304   PHURINE 5.5 04/06/2023 1304   GLUCOSEU NEGATIVE 04/06/2023 1304   HGBUR LARGE (A) 04/06/2023 1304   BILIRUBINUR NEGATIVE 04/06/2023 1304   KETONESUR NEGATIVE 04/06/2023 1304   PROTEINUR 30 (A) 04/06/2023 1304   NITRITE NEGATIVE 04/06/2023 1304   LEUKOCYTESUR SMALL (A) 04/06/2023 1304    Radiological Exams on Admission: DG Chest 2 View  Result Date: 04/20/2023 CLINICAL DATA:   Shortness of breath EXAM: CHEST - 2 VIEW COMPARISON:  04/08/2023 FINDINGS: Cardiac shadow is enlarged. Defibrillator is again noted. Aortic calcifications are seen. Previously noted right central venous catheter has been removed. Lungs are clear. No acute bony abnormality is noted. IMPRESSION: Stable cardiomegaly.  No acute abnormality noted. Electronically Signed   By: Alcide Clever M.D.   On: 04/20/2023 23:31    EKG: Independently reviewed.  Interpretation limited secondary to paced rhythm, RBBB.  Assessment and Plan  AKI on CKD stage IIIb Patient was recently admitted for decompensated CHF and discharged on torsemide 40 mg daily.  Creatinine was 1.8 at the time of discharge and now up to 3.2.  Patient states her lower extremity edema has improved.  No JVD on exam  and no rales appreciated on auscultation of the lungs.  Her chest x-ray is not showing pulmonary edema.  BNP elevated at 2204 but improved since labs done 2 weeks ago.  Suspect AKI is related to overdiuresis versus ?cardiorenal. Will hold torsemide at this time and avoid any other nephrotoxic agents.  Repeat labs to check renal function.  Check urine sodium and creatinine.  Renal ultrasound ordered.  Consider consulting nephrology if renal function does not improve.  Chronic HFrEF Moderate to severe MVR Echo done 2 weeks ago showing EF <20%, severe global hypokinesis and regional wall motion abnormalities, grade 3 diastolic dysfunction, RV systolic function moderately reduced, severely elevated pulmonary artery systolic pressure, severe left atrium dilation, moderate to severe MVR.  BNP is elevated but improved compared to labs done 2 weeks ago.  No pulmonary edema on chest x-ray.  Holding torsemide at this time due to AKI and concern for possible overdiuresis.  Repeat labs to check renal function.  She is not on RAAS inhibition or beta-blocker due to low output heart failure/risk of hypotension and not on SGLT2 inhibitor due to recent groin  infection.  Consult cardiology in the morning.  CAD status post PCI 12/2022 Patient is not endorsing chest pain.  Continue Plavix and Crestor.  Hypertension Normotensive.  Hyperlipidemia Continue Crestor.  Paroxysmal A-fib Continue Eliquis and amiodarone.  History of VT arrest  Status post ICD Continue cardiac monitoring.  Chronic pain Continue tramadol.  DVT prophylaxis: Eliquis Code Status: DNR/DNI (discussed with the patient) Family Communication: Husband at bedside. Level of care: Progressive Care Unit Admission status: It is my clinical opinion that referral for OBSERVATION is reasonable and necessary in this patient based on the above information provided. The aforementioned taken together are felt to place the patient at high risk for further clinical deterioration. However, it is anticipated that the patient may be medically stable for discharge from the hospital within 24 to 48 hours.   John Giovanni MD [facility]  If 7PM-7AM, please contact night-coverage [email or web address]  04/21/2023, 3:28 AM

## 2023-04-21 NOTE — Progress Notes (Signed)
Arrived 6E at [phone] Admission pending. Vitals signs checked. Alert and oriented. Initiated and verified continuous tele monitoring. Skin assessment completed with Naa, RN.No breakdown. Skin all over noted to be dry, scaly, and thin, brittle, shiny on bilateral lower extremities. Hand off care completed for continuity of care

## 2023-04-21 NOTE — Consult Note (Signed)
Nephrology Follow-Up Consult note   Assessment/Recommendations: Natasha Woodard is a/an 82 y.o. female with a past medical history significant for HFrEF, CAD, PAD, DM2, OSA, CKD3b admitted for AKI on CKD 3b.       Non-Oliguric AKI on CKD 3b: Patient with recent AKI thought to be related to dehydration with creatinine up to 4.4.  Creatinine returned to 1.8 at time of discharge.  Prior to that her baseline creatinine was 1.4-1.7.  Creatinine now 3 and stable.  Patient has some edema but unclear on her overall volume status.   -Her urine sodium is less than 10 which often suggest either volume depletion or effective cardio renal syndrome.  I favor the latter but hard to say whether it is related to congestion or decreased perfusion. -Crt not severely elevated above baseline. Even with this level of AKI I don't think it mandates hospitalization and could be managed/monitored outpatient.  -Volume status hard to define given my first time seeing her. She does have edema. I will discuss with Dr. Gala Romney and see if RHC may be of some benefit -After discussion with cardiology will decide on diuretic plan. I do not think she needs IVFs -Continue to monitor daily Cr, Dose meds for GFR -Monitor Daily I/Os, Daily weight  -Maintain MAP>65 for optimal renal perfusion.  -Avoid nephrotoxic medications including NSAIDs -Use synthetic opioids (Fentanyl/Dilaudid) if needed -RUS w/o concern -Currently no indication for HD  HFrEF: recent EF <20%. Not on SGLT2i given h/o groin infection. Holding arb/arni for now given aki issues. Will discuss plan with cardiology  Hypertension: Blood pressure acceptable with current medications  Uncontrolled Diabetes Mellitus Type 2 with Hyperglycemia:; Management per primary team  CAD: Fairly extensive history.  Plavix and Eliquis   Recommendations conveyed to primary service.    Darnell Level [facility] 04/21/2023 2:17  PM  ___________________________________________________________  CC: AKI  Interval History/Subjective: The patient was recently seen by nephrology when she was hospitalized last week.  She has a series of significant hospitalizations as previously outlined in nephrology notes.  Her most recent hospitalization was for nausea and vomiting.  She had a significant AKI thought to be related to prerenal insults and ischemic ATN.  She improved with supportive care.  Patient states that she has felt well since leaving the hospital.  Her shortness of breath has been persistent and overall unchanged.  Not having much nausea or vomiting.  Feels like her edema is about at baseline.  No fevers, chills, chest pain.  Does states she has had issues with constipation.   Medications:  Current Facility-Administered Medications  Medication Dose Route Frequency Provider Last Rate Last Admin   acetaminophen (TYLENOL) tablet 650 mg  650 mg Oral Q6H PRN John Giovanni, MD       Or   acetaminophen (TYLENOL) suppository 650 mg  650 mg Rectal Q6H PRN John Giovanni, MD       amiodarone (PACERONE) tablet 200 mg  200 mg Oral Daily John Giovanni, MD   200 mg at 04/21/23 1015   apixaban (ELIQUIS) tablet 2.5 mg  2.5 mg Oral BID John Giovanni, MD   2.5 mg at 04/21/23 1015   bisacodyl (DULCOLAX) suppository 10 mg  10 mg Rectal Daily PRN Lanae Boast, MD       clopidogrel (PLAVIX) tablet 75 mg  75 mg Oral Daily John Giovanni, MD   75 mg at 04/21/23 1015   insulin aspart (novoLOG) injection 0-9 Units  0-9 Units Subcutaneous TID WC Kc,  Dayna Barker, MD   1 Units at 04/21/23 1224   polyethylene glycol (MIRALAX / GLYCOLAX) packet 17 g  17 g Oral Daily Kc, Ramesh, MD       rosuvastatin (CRESTOR) tablet 10 mg  10 mg Oral Daily John Giovanni, MD   10 mg at 04/21/23 1015   traMADol (ULTRAM) tablet 50 mg  50 mg Oral BID John Giovanni, MD   50 mg at 04/21/23 1015      Review of Systems: 10 systems reviewed  and negative except per interval history/subjective  Physical Exam: Vitals:   04/21/23 0734 04/21/23 1217  BP: 121/81 125/84  Pulse: 72 70  Resp: 18 19  Temp: 97.8 F (36.6 C) 98 F (36.7 C)  SpO2: 95% 99%   No intake/output data recorded. No intake or output data in the 24 hours ending 04/21/23 1417 Constitutional: well-appearing, no acute distress ENMT: ears and nose without scars or lesions, MMM CV: normal rate, 1+ edema of the bilateral lower extremities up to the knee Respiratory: Bilateral chest rise, clear to auscultation anteriorly, normal work of breathing Gastrointestinal: soft, non-tender, no palpable masses or hernias Skin: Chronic skin thickening, otherwise no visible lesions or rashes Psych: alert, judgement/insight appropriate, appropriate mood and affect   Test Results I personally reviewed new and old clinical labs and radiology tests Lab Results  Component Value Date   NA 139 04/21/2023   K 3.6 04/21/2023   CL 96 (L) 04/21/2023   CO2 29 04/21/2023   BUN 52 (H) 04/21/2023   CREATININE 3.02 (H) 04/21/2023   GFR 13.48 (LL) 04/20/2023   CALCIUM 9.5 04/21/2023   ALBUMIN 3.6 04/20/2023   PHOS 2.5 04/14/2023    CBC Recent Labs  Lab 04/20/23 1325 04/20/23 1758  WBC 4.6 4.9  NEUTROABS 3.1  --   HGB 12.5 12.8  HCT 39.3 40.8  MCV 91.4 93.4  PLT 271.0 268

## 2023-04-21 NOTE — Progress Notes (Signed)
Lactate 2.8. Likely low flow state in the setting of severe LV/RV dysfunction, severe MR, severe pulmonary HTN. Will start milrinone 0.25mg  peripherally to help improve forward flow (has not been interested in palliative measures in the past despite multiple conversations with HF team). Once initiated on milrinone, will start lasix 80mg  IV for diuresis. Plan discussed with Dr. Gala Romney.   Laurance Flatten, MD

## 2023-04-21 NOTE — Progress Notes (Signed)
Date and time results received: 04/21/23 1512 (use smartphrase ".now" to insert current time)  Test: lactic acid Critical Value: 2.8  Name of Provider Notified: Dr. Jonathon Bellows  Orders Received? Or Actions Taken?:  Received order from Dr. Shari Prows to start milrinone through PIV.

## 2023-04-21 NOTE — Consult Note (Addendum)
Cardiology Consultation   Patient ID: JAZMYNN RIPPE MRN: [id number]; DOB: Apr 20, 1941  Admit date: 04/20/2023 Date of Consult: 04/21/2023  PCP:  [name], MD   [facility] Providers Cardiologist:  Bryan Lemma, MD        Patient Profile:   Natasha Woodard is a 82 y.o. female with a hx of STEMI in Jan 2024 with PCI, cardiogenic shock, hx freq PVCs thought to have added to prior NSTEMI before 2024, CAD, HTN, HLD, DM-2, PAD, CKD-3, OSA, PAF, and ICD placed 01/2023  who is being seen 04/21/2023 for the evaluation of CHF at the request of Dr. Jonathon Bellows.  History of Present Illness:   Natasha Woodard with hx of chronic HFpEF, hx NICM, freq PVCs, on low dose amiodarone HLD, DM-2, PAD followed by Dr. Kirke Corin and HTN with recent admits this year including STEMI 01/10/23 with PCI to distal LCX, IABP placed for severe LV dysfunction and acute MR.    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  With rehab discharged 02/28/23   In April admitted with AKI and acute gastroenteritis secondary to enterotoxigenic E coli secondary to viral gastroenteritis. Once discharged she continued with N,V and abd pain re-admitted 04/06/23.  + severe metabolic acidosis , + tachypneic requiring BiPAP therapy, AKI with Cr up to 4.29.  prior baseline 1.5-1.8.  + acute on chronic HFrEF/CM.    Elevated troponin in setting of AKI + demand ischemia.  She continued on Plavix and OAC.  No ASA on plavix and OAC,  statin held due to elevated transaminitis.  Renal function improved holding diuretic. She was on po torsemide and metolazone and developed edema.  She was placed back on IV lasix on 5/3 for volume overload and edema improved.  .  At discharge Cr 1.83 Pt improved and was discharged 04/16/23.  Discharged on  torsemide 40 daily, no SGLT due to hx of groin infection, amio continued 200 daily, eliquis 2.5 BID, plavix 75, crestor 10.  Echo 04/06/23 with EF <20%, severe global hypokinesis, + RWMA, G3 DD There is akinesis of the left ventricular, mid-apical lateral wall and inferolateral wall.  There is akinesis of the left ventricular, apical segment   RV systolic function is mod reduced. RV size is normal, severely elevated pulmonary artery systolic pressure. The estimated right ventricular systolic pressure is 72.5 mmHg.  LA severely dilated, RA mildly dilated  Based on eccentricity of MR and akinesis of the inferolateral and lateral walls, suspect this is ischemic MR due to restricted posterior mitral valve leaflet.. The mitral valve is abnormal. Moderate to severe  mitral valve regurgitation. No evidence of mitral stenosis   trivial AI  mild TR (EF is lower than TEE 01/18/23 when 30-35%, RV function has decreased, mod MR at that time)   Yesterday pt saw PCP and Cr back to 3.10 she was sent to ER she did not feel any different - denies SOB no chest pain.  No syncope.   In ER BNP 2,204 (had been up to 2,843 on recent admit),  Na 140, K+ 3.7 BUN 52, Cr 3.24  ALT 86 alk phos 158 and AST 30  CBC WNL  Today BUN 52, Cr 3.02 Na 139 K+ 3.6   2V CXR yesterday stable cardiomegaly no acute abnormality.  EKG atrial pacing RBBB V sensing EKG:  The EKG was personally reviewed and demonstrates:  atrial pacing and V sensing RBBB Telemetry:  Telemetry was personally reviewed and demonstrates:  mostly atrial pacing and V sensing rare PCV   BP 126/86  P 73 R 18 afebrile.  Sp02 92 % Walking with PT  Past Medical History:  Diagnosis Date   AICD (automatic cardioverter/defibrillator) present    Calcific tendonitis 2008   Treatment of left leg   Cardiomyopathy (HCC) 06/2017   a) Echo: EF 25-30%. GR 1-2 DD w/ elevated LVEDP. Mild valvular Dz; b) Cardiac MRI 2/'19: frequent PVCs (diffiuclt to interpret) - EF ~27% w/  diffuse HK.  No evidence of infarct, infiltrative Dz or myocarditis. -- ? if related to PVCs. c) f/u Echo 6/'19: EF 35-40%. Gr 1 DD. Diffuse HK.-> d) Jan 2020 EF 50 to 55%.  Mod MAC, mod LAE.  Ao Sclerosis; e) Echo 1/'21 - EF 60-65%, Gr II DD. Mild Mod MR.    Chronic combined systolic and diastolic CHF, NYHA class 2 and ACA/AHA stage C    Now essentially resolved, back to simply diastolic CHF   Coronary artery disease, non-occlusive    mild-moderate CAD 06/30/17 cath   CTS (carpal tunnel syndrome)    Diabetes mellitus    Type II   Dysrhythmia    patient said that she cant remember what it is   Frequent unifocal PVCs 01/2018   Event Monitor: NSR, W/ frequent multifocal PVCs (9%-down from 30% prior to amiodarone) and PACs.  Nighttime bradycardia suggestive of OSA.';  Zio patch September 2023: PVC burden now 6.1%.   Hiatal hernia    with reflux   Hyperlipidemia    Statin intolerant   Hypertension    Ichthyosis congenita    Intermittent claudication of both lower extremities due to atherosclerosis (HCC) 08/22/2021   LEA Dopplers 09/09/2021:  Right: Total occlusion noted in the superficial femoral artery. Atherosclerosis noted throughout extremity, see note above. Three vessel runoff.  Left: Total occlusion noted in the superficial femoral artery and/or popliteal artery. Total occlusion noted in the distal anterior tibial artery. Athereosclerosis noted throughout extremity.    NSVD (normal spontaneous vaginal delivery) 1971 & 1972   Obesity    Plantar fasciitis    Left   Presence of permanent cardiac pacemaker    Pulmonary nodule    Imaged multiple times and benign appearing   Sleep apnea    Wears glasses     Past Surgical History:  Procedure Laterality Date   ABDOMINAL HYSTERECTOMY  1975   TAH-BSO   ABSCESS DRAINAGE  1972   right breast   APPLICATION OF WOUND VAC Right 01/15/2023   Procedure: APPLICATION OF INCISIONAL WOUND VAC;  Surgeon: Victorino Sparrow, MD;  Location: [facility] OR;   Service: Vascular;  Laterality: Right;   APPLICATION OF WOUND VAC Right 01/26/2023   Procedure: APPLICATION OF WOUND VAC RIGHT GROIN WASHOUT AND SARTORIUS MUSCLE FLAP;  Surgeon: Victorino Sparrow, MD;  Location: [facility] OR;  Service: Vascular;  Laterality: Right;   ARTERY REPAIR Right 01/15/2023   Procedure: RIGHT PROFUNDA ARTERY REPAIR WITH 2000G MYRIAD MORCELLS;  Surgeon: Victorino Sparrow, MD;  Location: [facility] OR;  Service: Vascular;  Laterality: Right;   BREAST BIOPSY Left 01/14/2009   Stereo- Benign   CARDIAC MRI  01/2018   Difficult to interpret 2/2 PVCs. Normal LV size - EF ~27% with diffuse HK. Mild RV dilation - normal fxn. -- NO MYOCARDIAL LGI => no  definitive evidence of prior MI, Infiltrative Dz or Myocarditis -- suspect NICM, possibly related to PVCs.   CARPAL TUNNEL RELEASE Right 07/02/2014   Procedure: RIGHT CARPAL TUNNEL RELEASE;  Surgeon: Nicki Reaper, MD;  Location: [facility];  Service: Orthopedics;  Laterality: Right;   CARPAL TUNNEL RELEASE Left 06/11/2015   Procedure: LEFT CARPAL TUNNEL RELEASE;  Surgeon: Cindee Salt, MD;  Location: [facility];  Service: Orthopedics;  Laterality: Left;  REGIONAL/FAB   COLONOSCOPY     corn removal  1968   right   CORONARY STENT INTERVENTION N/A 01/10/2023   Procedure: CORONARY STENT INTERVENTION;  Surgeon: Lennette Bihari, MD;  Location: [facility] INVASIVE CV LAB;  Service: Cardiovascular;  Laterality: N/A;   GROIN DEBRIDEMENT Right 02/06/2023   Procedure: RIGHT GROIN DEBRIDEMENT WITH WOUND VAC CHANGE;  Surgeon: Victorino Sparrow, MD;  Location: [facility] OR;  Service: Vascular;  Laterality: Right;   HEMATOMA EVACUATION Right 01/15/2023   Procedure: EVACUATION HEMATOMA RIGHT GROIN;  Surgeon: Victorino Sparrow, MD;  Location: [facility] OR;  Service: Vascular;  Laterality: Right;   Holter Monitor  06/2017   ~17,000 PVC beats - majority were singlets, some couplets. 44 brief 3-7 beat runs of NSVT. Also noted were less frequent PACs with 11 runs (longest  15 beats)   IABP INSERTION Right 01/10/2023   Procedure: IABP Insertion;  Surgeon: Lennette Bihari, MD;  Location: [facility] INVASIVE CV LAB;  Service: Cardiovascular;  Laterality: Right;   ICD IMPLANT N/A 01/23/2023   Procedure: ICD IMPLANT;  Surgeon: Marinus Maw, MD;  Location: [facility] INVASIVE CV LAB;  Service: Cardiovascular;  Laterality: N/A;   INCISION AND DRAINAGE OF WOUND Right 01/31/2023   Procedure: IRRIGATION AND DEBRIDEMENT RIGHT GROIN WOUND AND WOUND VAC CHANGE;  Surgeon: Victorino Sparrow, MD;  Location: [facility] OR;  Service: Vascular;  Laterality: Right;   OPEN REDUCTION INTERNAL FIXATION (ORIF) DISTAL RADIAL FRACTURE Left 07/21/2017   Procedure: OPEN REDUCTION INTERNAL FIXATION (ORIF) LEFT DISTAL RADIAL FRACTURE;  Surgeon: Sheral Apley, MD;  Location: [facility] OR;  Service: Orthopedics;  Laterality: Left;   RIGHT/LEFT HEART CATH AND CORONARY ANGIOGRAPHY N/A 06/30/2017   Procedure: Right/Left Heart Cath and Coronary Angiography;  Surgeon: Marykay Lex, MD;  Location: [facility] INVASIVE CV LAB:  pRCA 55%, pCx 40%, OM1 45%, mCx 50%, D2 50% - LVEF 25-35%. Moderately elevated LVEDP(26 mmHg with PCWP 16 mmHg).  FICK CO/CI: 4.47/2.48. PA pressures 47/14 mmHg with a mean of 27 mm.   RIGHT/LEFT HEART CATH AND CORONARY ANGIOGRAPHY N/A 01/10/2023   Procedure: RIGHT/LEFT HEART CATH AND CORONARY ANGIOGRAPHY;  Surgeon: Lennette Bihari, MD;  Location: [facility] INVASIVE CV LAB;  Service: Cardiovascular;  Laterality: N/A;   ROTATOR CUFF REPAIR     left shoulder   TEMPORARY PACEMAKER N/A 01/19/2023   Procedure: TEMPORARY PACEMAKER;  Surgeon: Dolores Patty, MD;  Location: [facility] INVASIVE CV LAB;  Service: Cardiovascular;  Laterality: N/A;   TRANSTHORACIC ECHOCARDIOGRAM  06/2017   EF 25 and 30%. GR 1 DD. Mild diastolic dysfunction with elevated LVEDP. Mild valvular disease.   TRANSTHORACIC ECHOCARDIOGRAM  11'18, 6/'19    a) EF remains 30-35%.  Diffuse hypokinesis noted.  Severe LA dilation; b) Improved EF 35-40%.  Diffuse HK.   GR 1 DD.  Moderate TR.  Mild RV dilation.   TRANSTHORACIC ECHOCARDIOGRAM  01/28/2022   Follow-up echo: EF 50 to 55%.  No RWMA.  GRII DD.  Severe LA dilation.  Mild RA dilation with normal PAP  and RAP.  Mild to moderate TR (no significant change noted)   Zio patch  08/2022   3-day: Currently NSR (HR range 47-91 with average 64 bpm) 1 F AVB with IVCD/BBB.  Rare isolated PACs (<1%).  Frequent PVCs (6.1% with rare couplets and triplets.  Also bigeminy.  2 atrial runs 8 beats 5 2 seconds.  No sustained arrhythmias.   ZIO Patch 14 d Event Monitor  10/2018   Results as below < 1% PVC.  (Notably improved after starting amiodarone)     Home Medications:  Prior to Admission medications   Medication Sig Start Date End Date Taking? Authorizing Provider  acetaminophen (TYLENOL) 325 MG tablet Take 1-2 tablets (325-650 mg total) by mouth every 6 (six) hours as needed. Patient taking differently: Take 325-650 mg by mouth in the morning and at bedtime. 02/28/23  Yes Setzer, Lynnell Jude, PA-C  amiodarone (PACERONE) 200 MG tablet Take 1 tablet (200 mg total) by mouth daily. 04/03/23 07/02/23 Yes Pokhrel, Rebekah Chesterfield, MD  apixaban (ELIQUIS) 2.5 MG TABS tablet Take 1 tablet (2.5 mg total) by mouth 2 (two) times daily. 04/03/23 04/02/24 Yes Pokhrel, Laxman, MD  clopidogrel (PLAVIX) 75 MG tablet Take 1 tablet (75 mg total) by mouth daily. 04/03/23 04/02/24 Yes Pokhrel, Laxman, MD  melatonin 5 MG TABS Take 5 mg by mouth at bedtime as needed (for sleep).   Yes [provider]  Multiple Vitamin (MULTIVITAMIN WITH MINERALS) TABS tablet Take 1 tablet by mouth daily. 03/01/23  Yes Setzer, Lynnell Jude, PA-C  ondansetron (ZOFRAN-ODT) 4 MG disintegrating tablet Take 1 tablet (4 mg total) by mouth every 8 (eight) hours as needed for nausea or vomiting. 04/05/23  Yes [name], MD  rosuvastatin (CRESTOR) 10 MG tablet Take 1 tablet (10 mg total) by mouth daily. TAKE 1 TABLET(10 MG) BY MOUTH DAILY Strength: 10 mg Patient taking  differently: Take 10 mg by mouth daily. 02/28/23  Yes Setzer, Lynnell Jude, PA-C  torsemide (DEMADEX) 20 MG tablet Take 2 tablets (40 mg total) by mouth daily. 04/17/23 05/17/23 Yes Arrien, York Ram, MD  traMADol (ULTRAM) 50 MG tablet Take 1 tablet (50 mg total) by mouth 2 (two) times daily. 02/28/23  Yes Setzer, Lynnell Jude, PA-C  triamcinolone cream (KENALOG) 0.1 % Apply 1 Application topically 2 (two) times daily as needed (for itching). Patient taking differently: Apply 1 Application topically 2 (two) times daily as needed (for itching). Apply to back for itching 04/17/23  Yes [name], MD  feeding supplement, GLUCERNA SHAKE, (GLUCERNA SHAKE) LIQD Take 237 mLs by mouth 2 (two) times daily between meals. Patient not taking: Reported on 04/21/2023 04/16/23 05/16/23  Arrien, York Ram, MD  glucose blood (ACCU-CHEK AVIVA PLUS) test strip USE TO TEST BLOOD SUGAR ONCE DAILY 03/23/23   [name], MD    Inpatient Medications: Scheduled Meds:  amiodarone  200 mg Oral Daily   apixaban  2.5 mg Oral BID   clopidogrel  75 mg Oral Daily   insulin aspart  0-9 Units Subcutaneous TID WC   rosuvastatin  10 mg Oral Daily   traMADol  50 mg Oral BID   Continuous Infusions:  PRN Meds: acetaminophen **OR** acetaminophen  Allergies:    Allergies  Allergen Reactions   Chlorhexidine Gluconate Rash    Pt refuses CHG due to inflammation and rash   Ezetimibe-Simvastatin Other (See Comments)    REACTION: Muscle aches (side effect)   Lipitor [Atorvastatin] Other (See Comments)    Leg weakness  Claritin [Loratadine]     Possible cause of nightmares.     Spironolactone     Caution re: elevated potassium/creatinine   Tamiflu [Oseltamivir Phosphate] Other (See Comments)    nightmares   Pravastatin Sodium Other (See Comments)    REACTION: Muscle aches (side effect)   Sulfonamide Derivatives Nausea And Vomiting    Social History:   Social History   Socioeconomic History   Marital status:  Married    Spouse name: Not on file   Number of children: 2   Years of education: Not on file   Highest education level: Bachelor's degree (e.g., BA, AB, BS)  Occupational History    Employer: RETIRED  Tobacco Use   Smoking status: Former    Years: 28    Types: Cigarettes    Quit date: 12/12/1993    Years since quitting: 29.3    Passive exposure: Never   Smokeless tobacco: Never  Vaping Use   Vaping Use: Never used  Substance and Sexual Activity   Alcohol use: No    Alcohol/week: 0.0 standard drinks of alcohol   Drug use: No   Sexual activity: Never  Other Topics Concern   Not on file  Social History Narrative   Two kids, two grandchildren, two great grandchildren.    Married 1969   Retired.    Social Determinants of Health   Financial Resource Strain: Low Risk  (03/27/2023)   Overall Financial Resource Strain (CARDIA)    Difficulty of Paying Living Expenses: Not very hard  Food Insecurity: No Food Insecurity (04/17/2023)   Hunger Vital Sign    Worried About Running Out of Food in the Last Year: Never true    Ran Out of Food in the Last Year: Never true  Transportation Needs: No Transportation Needs (04/17/2023)   PRAPARE - Administrator, Civil Service (Medical): No    Lack of Transportation (Non-Medical): No  Physical Activity: Sufficiently Active (03/27/2023)   Exercise Vital Sign    Days of Exercise per Week: 4 days    Minutes of Exercise per Session: 40 min  Stress: No Stress Concern Present (03/27/2023)   [employer] of Occupational Health - Occupational Stress Questionnaire    Feeling of Stress : Only a little  Social Connections: Unknown (03/27/2023)   Social Connection and Isolation Panel [NHANES]    Frequency of Communication with Friends and Family: More than three times a week    Frequency of Social Gatherings with Friends and Family: Twice a week    Attends Religious Services: More than 4 times per year    Active Member of [employer] or  Organizations: Not on file    Attends Banker Meetings: Not on file    Marital Status: Married  Intimate Partner Violence: Not At Risk (04/06/2023)   Humiliation, Afraid, Rape, and Kick questionnaire    Fear of Current or Ex-Partner: No    Emotionally Abused: No    Physically Abused: No    Sexually Abused: No    Family History:    Family History  Problem Relation Age of Onset   Heart disease Mother        s/p pacemaker   Diabetes Mother    Heart disease Father    Diabetes Father    Heart disease Brother        MI   Cancer Brother        Lung   Hypertension Other    Colon cancer Neg Hx  Breast cancer Neg Hx      ROS:  Please see the history of present illness.  General:no colds or fevers, no weight changes Skin:no rashes or ulcers HEENT:no blurred vision, no congestion CV:see HPI PUL:see HPI GI:no diarrhea constipation or melena, no indigestion GU:no hematuria, no dysuria MS:no joint pain, no claudication Neuro:no syncope, no lightheadedness Endo:+ diabetes, no thyroid disease  All other ROS reviewed and negative.     Physical Exam/Data:   Vitals:   04/20/23 2314 04/21/23 0200 04/21/23 0258 04/21/23 0734  BP: 132/87 126/89  121/81  Pulse: 73 90  72  Resp: 20 12  18   Temp: 97.8 F (36.6 C)  98.1 F (36.7 C) 97.8 F (36.6 C)  TempSrc: Oral  Oral Oral  SpO2: 96% 96%  95%   No intake or output data in the 24 hours ending 04/21/23 1107    04/20/2023   12:30 PM 04/15/2023    6:24 AM 04/14/2023    6:06 AM  Last 3 Weights  Weight (lbs) 160 lb 178 lb 5.6 oz 166 lb 14.2 oz  Weight (kg) 72.576 kg 80.9 kg 75.7 kg     There is no height or weight on file to calculate BMI.  General:  Well nourished, well developed, in no acute distress beginning walk in hall with walker and PT HEENT: normal Neck: no JVD Vascular: No carotid bruits; Distal pulses 2+ bilaterally Cardiac:  normal S1, S2; RRR; no murmur gallup rub or click Lungs:  clear to auscultation  bilaterally, no wheezing, rhonchi or rales  Abd: soft, nontender, no hepatomegaly  Ext: 1-2+ lower ext edema Musculoskeletal:  No deformities, BUE and BLE strength normal and equal Skin: warm and dry  Neuro:  alert and oriented X 3 MAE follows commands, no focal abnormalities noted Psych:  Normal affect   Relevant CV Studies: ECHO 04/06/23  IMPRESSIONS    1. Left ventricular ejection fraction, by estimation, is <20%. The left  ventricle has severely decreased function. The left ventricle demonstrate  severe global hypokinesis and regional wall motion abnormalities (see  scoring diagram/findings for  description). The left ventricular internal cavity size was mildly  dilated. Left ventricular diastolic parameters are consistent with Grade  III diastolic dysfunction (restrictive). Elevated left ventricular  end-diastolic pressure. There is akinesis of  the left ventricular, mid-apical lateral wall and inferolateral wall.  There is akinesis of the left ventricular, apical segment.   2. Right ventricular systolic function is moderately reduced. The right  ventricular size is normal. There is severely elevated pulmonary artery  systolic pressure. The estimated right ventricular systolic pressure is  72.5 mmHg.   3. Left atrial size was severely dilated.   4. Right atrial size was mildly dilated.   5. Based on eccentricity of MR and akinesis of the inferolateral and  lateral walls, suspect this is ischemic MR due to restricted posterior  mitral valve leaflet.. The mitral valve is abnormal. Moderate to severe  mitral valve regurgitation. No evidence  of mitral stenosis.   6. The aortic valve is tricuspid. Aortic valve regurgitation is trivial.  Aortic valve sclerosis/calcification is present, without any evidence of  aortic stenosis. Aortic valve area, by VTI measures 1.81 cm. Aortic valve  mean gradient measures 3.0 mmHg.   Aortic valve Vmax measures 1.15 m/s.   7. The inferior vena  cava is dilated in size with <50% respiratory  variability, suggesting right atrial pressure of 15 mmHg.   FINDINGS   Left Ventricle: Left ventricular ejection  fraction, by estimation, is  <20%. The left ventricle has severely decreased function. The left  ventricle demonstrates regional wall motion abnormalities. The left  ventricular internal cavity size was mildly  dilated. There is no left ventricular hypertrophy. Left ventricular  diastolic parameters are consistent with Grade III diastolic dysfunction  (restrictive). Elevated left ventricular end-diastolic pressure.   Right Ventricle: The right ventricular size is normal. No increase in  right ventricular wall thickness. Right ventricular systolic function is  moderately reduced. There is severely elevated pulmonary artery systolic  pressure. The tricuspid regurgitant  velocity is 3.79 m/s, and with an assumed right atrial pressure of 15  mmHg, the estimated right ventricular systolic pressure is 72.5 mmHg.   Left Atrium: Left atrial size was severely dilated.   Right Atrium: Right atrial size was mildly dilated.   Pericardium: There is no evidence of pericardial effusion.   Mitral Valve: Based on eccentricity of MR and akinesis of the  inferolateral and lateral walls, suspect this is ischemic MR due to  restricted posterior mitral valve leaflet. The mitral valve is abnormal.  Moderate to severe mitral valve regurgitation, with   eccentric posteriorly directed jet. No evidence of mitral valve stenosis.   Tricuspid Valve: The tricuspid valve is normal in structure. Tricuspid  valve regurgitation is mild . No evidence of tricuspid stenosis.   Aortic Valve: The aortic valve is tricuspid. Aortic valve regurgitation is  trivial. Aortic valve sclerosis/calcification is present, without any  evidence of aortic stenosis. Aortic valve mean gradient measures 3.0 mmHg.  Aortic valve peak gradient  measures 5.3 mmHg. Aortic valve  area, by VTI measures 1.81 cm.   Pulmonic Valve: The pulmonic valve was normal in structure. Pulmonic valve  regurgitation is not visualized. No evidence of pulmonic stenosis.   Aorta: The aortic root is normal in size and structure.   Venous: The inferior vena cava is dilated in size with less than 50%  respiratory variability, suggesting right atrial pressure of 15 mmHg.   IAS/Shunts: No atrial level shunt detected by color flow Doppler.   Additional Comments: A device lead is visualized.    ECHO bedside TEE 01/18/23 IMPRESSIONS     1. Left ventricular ejection fraction, by estimation, is 30 to 35%. The  left ventricle has moderately decreased function.   2. Right ventricular systolic function is normal. The right ventricular  size is normal.   3. Left atrial size was severely dilated. No left atrial/left atrial  appendage thrombus was detected.   4. Posterior leaflet is restricted. Moderate MR. The mitral valve is  abnormal. Moderate mitral valve regurgitation.   5. The aortic valve is tricuspid. There is mild calcification of the  aortic valve. Aortic valve regurgitation is trivial.   FINDINGS   Left Ventricle: Left ventricular ejection fraction, by estimation, is 30  to 35%. The left ventricle has moderately decreased function. The left  ventricular internal cavity size was normal in size.   Right Ventricle: The right ventricular size is normal. No increase in  right ventricular wall thickness. Right ventricular systolic function is  normal.   Left Atrium: Left atrial size was severely dilated. No left atrial/left  atrial appendage thrombus was detected.   Right Atrium: Right atrial size was normal in size.   Pericardium: There is no evidence of pericardial effusion.   Mitral Valve: Posterior leaflet is restricted. Moderate MR. The mitral  valve is abnormal. Moderate mitral valve regurgitation. MV peak gradient,  6.4 mmHg. The mean mitral valve  gradient is 1.0 mmHg.    Tricuspid Valve: The tricuspid valve is normal in structure. Tricuspid  valve regurgitation is mild.   Aortic Valve: The aortic valve is tricuspid. There is mild calcification  of the aortic valve. Aortic valve regurgitation is trivial.   Pulmonic Valve: The pulmonic valve was normal in structure. Pulmonic valve  regurgitation is mild.   Aorta: The aortic root was not well visualized.   IAS/Shunts: No atrial level shunt detected by color flow Doppler.     Cardiac cath/PCI 01/10/23    Prox Cx to Mid Cx lesion is 40% stenosed.   Prox RCA lesion is 30% stenosed.   Prox RCA to Mid RCA lesion is 30% stenosed.   Mid RCA lesion is 40% stenosed.   Dist Cx lesion is 100% stenosed.   3rd Mrg lesion is 100% stenosed.   Prox LAD lesion is 50% stenosed.   1st Diag lesion is 50% stenosed.   Mid LAD lesion is 20% stenosed.   Dist LAD lesion is 20% stenosed.   4th Mrg-1 lesion is 60% stenosed.   4th Mrg-2 lesion is 60% stenosed.   A drug-eluting stent was successfully placed.   Post intervention, there is a 0% residual stenosis.   Hemodynamic findings consistent with moderate pulmonary hypertension.   Acute coronary syndrome/inferolateral STEMI secondary to distal occlusion of the circumflex marginal vessel.   Concomitant CAD with 50% proximal LAD stenosis, 50% stenosis in the first diagonal branch, 20% mid stenoses.   Mild 30 to 40% stenoses in the RCA.   Left circumflex vessel with total occlusion and TIMI 0 flow to the distal marginal vessel supplying the inferolateral apex.  There also is total occlusion of a small distal branch and diffuse segmental 60% stenosis in a medial branch.   Successful PCI to the distal circumflex with ultimate insertion of a 2.25 x 22 mm Onyx frontier stent dilated with stent taper from 2.27-2.2 distally.   Insertion of intra-aortic balloon pump the right femoral artery in this patient with severe LV dysfunction and acute mitral regurgitation probably  contributed by papillary muscle dysfunction..   Swan-Ganz catheterization demonstrating severe pulmonary hypertension with PA pressure 74/35, mean 48.   RECOMMENDATION: Patient received DAPT with aspirin/Brilinta.  Will need close observation of hemodynamic status with balloon pump.  CCM consult.  Patient apparently is statin intolerant and and for future PCSK9 inhibition.    Diagnostic Dominance: Right  Intervention      Laboratory Data:  High Sensitivity Troponin:   Recent Labs  Lab 03/31/23 0706 03/31/23 0930 04/05/23 1820 04/05/23 2050  TROPONINIHS 45* 40* 75* 76*     Chemistry Recent Labs  Lab 04/16/23 0116 04/20/23 1325 04/20/23 1758 04/21/23 0515  NA 140 142 140 139  K 4.1 4.5 3.7 3.6  CL 101 95* 97* 96*  CO2 30 33* 29 29  GLUCOSE 109* 155* 153* 118*  BUN 44* 55* 52* 52*  CREATININE 1.83* 3.10* 3.24* 3.02*  CALCIUM 9.4 10.1 9.6 9.5  MG 1.8  --   --   --   GFRNONAA 27*  --  14* 15*  ANIONGAP 9  --  14 14    Recent Labs  Lab 04/20/23 1325 04/20/23 1758  PROT 7.0 7.0  ALBUMIN 3.9 3.6  AST 28 30  ALT 84* 86*  ALKPHOS 158* 158*  BILITOT 1.0 1.0   Lipids No results for input(s): "CHOL", "TRIG", "HDL", "LABVLDL", "LDLCALC", "CHOLHDL" in the last 168 hours.  Hematology Recent Labs  Lab 04/20/23 1325 04/20/23 1758  WBC 4.6 4.9  RBC 4.29 4.37  HGB 12.5 12.8  HCT 39.3 40.8  MCV 91.4 93.4  MCH  --  29.3  MCHC 31.7 31.4  RDW 16.8* 16.0*  PLT 271.0 268   Thyroid No results for input(s): "TSH", "FREET4" in the last 168 hours.  BNP Recent Labs  Lab 04/20/23 1758  BNP 2,204.9*    DDimer No results for input(s): "DDIMER" in the last 168 hours.   Radiology/Studies:  US RENAL  Result Date: 04/22/2023 CLINICAL DATA:  Acute kidney injury EXAM: RENAL / URINARY TRACT ULTRASOUND COMPLETE COMPARISON:  04/06/2023 right upper quadrant ultrasound. FINDINGS: Right Kidney: Renal measurements: 10 x 4 x 5 cm no hydronephrosis. Echogenic area at the anterior  cortex is perinephric fat by prior CT. No concerning mass. No hydronephrosis. Left Kidney: Renal measurements: 10 x 5 x 5 cm no hydronephrosis or lesion seen. Suboptimal visualization due to shadowing colonic gas. Bladder: Not visualized. IMPRESSION: No acute finding.  No hydronephrosis. Electronically Signed   By: Tiburcio Pea M.D.   On: 04/22/2023 06:17   DG Chest 2 View  Result Date: 04/20/2023 CLINICAL DATA:  Shortness of breath EXAM: CHEST - 2 VIEW COMPARISON:  04/08/2023 FINDINGS: Cardiac shadow is enlarged. Defibrillator is again noted. Aortic calcifications are seen. Previously noted right central venous catheter has been removed. Lungs are clear. No acute bony abnormality is noted. IMPRESSION: Stable cardiomegaly.  No acute abnormality noted. Electronically Signed   By: Alcide Clever M.D.   On: 04/20/2023 23:31     Assessment and Plan:   Acute combined HFrEF with grade 3 dysfunction --was home 4 days and had increase of Cr.  During hospitalization on po diuretics she became volume overloaded and needed IV lasix prior to discharge though Cr was stable.   On po diuretics torsemide she develops AKI.  - medical therapy limited by prior low output HF, prior issues with low bp's, prior groin infection avoding SGLT2i, current AKI   wt at discharge 75 Kg will order daily weights   --not on diuretic currently with Cr elevation.  ? Need for diuretic she does not seem overloaded currently.   AKI with volume overload though no SOB, she was admitted for AKI would ask nephrology to weigh in.  CAD with STEMI in Jan 2024 with PCI to distal LCX and EF from 20-25% with MI to 30-35% on milrinone - is now <20%   she is on Plavix and no ASA with eliquis PAF on eliquis with maintaining SR atrial pacing and V sensing with RBBB  Hx of tosades with  STEMI admit and has ICD dual chamber  Hx of PVCs and has been on amiodarone for some time. Prior to Jan 2024.   TSH 04/11/23 was 4.765  monitor with amio.  ALT has been  mildly elevated at 84, 86 though AST WNL  alk phos 158  (ALT on 04/07/23 was 1,119 and AST 823)   so improved.   Risk Assessment/Risk Scores:        New York Heart Association (NYHA) Functional Class NYHA Class III  CHA2DS2-VASc Score = 7   This indicates a 11.2% annual risk of stroke. The patient's score is based upon: CHF History: 1 HTN History: 1 Diabetes History: 1 Stroke History: 0 Vascular Disease History: 1 Age Score: 2 Gender Score: 1         For questions or updates, please contact [facility] Please consult [email or web address] for contact  info under    Signed, Nada Boozer, NP  04/21/2023 11:07 AM  Patient seen and examined and agree with Nada Boozer, NP as detailed above.  In brief, the patient is a 82 year old female ith history of STEMI in 12/2022 s/p PCI to Lcx with cardiogenic shock at that time requiring IABP in the setting of severe LV dysfunction and ischemic MR, torsades s/p dual chamber ICD, PAD, HTN, DMII, CKD III, pAfib who presented from PCP office for AKI on CKD. Cardiology is now consulted for systolic HF.   Patient with very complex history as detailed above. Has had several recent admissions with last from 04/05/23-04/16/23 for sepsis with course complicated by acute on chronic systolic HF and AKI on CKD. TTE that admission with LVEF <20%, G3DDD, moderate RV dysfunction, severe pulmonary HTN, severe LAE, severe MR, RAP . She was discharged on torsemide 40mg  daily.  She presented to PCP office where she was found to have Cr 3 from 1.8 prompting admission. In the ER, labs notable for Cr 3.10, BNP 2204.9. She reports mild worsening in LE edema and weight gain at home but states she does not feel much different compared to how she felt on discharge. She is frustrated being back in the hospital.  Overall, I am concerned given the degree of her LV dysfunction, moderate to severe MR, severe pulmonary HTN with RV dysfunction that the patient is in  a low output state with poor forward flow. Will check lactate to better assess degree of perfusion. If elevated, likely will need inotropes but concerned about long-term prognosis for her. If lactate normal, can trial lasix 80mg  IV and monitor renal function closely. Given multiorgan dysfunction and frequent readmissions, there is concern about overall long-term prognosis. May need to engage palliative care pending above work-up.  GEN: Elderly female, hard of hearing, NAD Neck: JVD to mid neck Cardiac: RRR, 2/6 systolic murmur Respiratory: Diminished at the bases GI: Soft, nontender, non-distended  MS: Legs are luke-warm, ankles are cold. 1+ LE edema Neuro:  Nonfocal  Psych: Normal affect    Plan: -Check lactate to better assess perfusion -If lactate elevated, will engage HF team about possibility of inotropic support however given degree of HF and frequent hospitalizations, prognosis is poor -If lactate normal, plan for lasix 80mg  IV and monitoring response -Unable to tolerate further GDMT due to AKI on CKD; will add as able -Continue plavix and apixaban -Continue amiodarone 200mg  daily -Consider palliative care evaluation   Laurance Flatten, MD

## 2023-04-21 NOTE — Progress Notes (Addendum)
Patient seen and examined personally, I reviewed the chart, history and physical and admission note, done by admitting physician this morning and agree with the same with following addendum.  Please refer to the morning admission note for more detailed plan of care.  Briefly,   81 yof w/ significant complex medical history including chronic HFrEF (EF less than 20%), moderate to severe MR, CAD status post PCI 12/2022, hypertension, hyperlipidemia, paroxysmal A-fib on Eliquis, history of VT arrest, status post ICD, CKD stage IIIb, pseudoaneurysm of right groin.  recently admitted 4/24-5/5 for decompensated CHF, AKI, and septic shock secondary to UTI who was discharged on torsemide 40 mg daily seen by PCP 5/9 and sent to the ED due to concern for AKI.  Patient complains of shortness of breath exertions since discharge, does not think she has gotten any worse, has been taking torsemide, feels her lower extremity edema has improved.She is not on RAAS inhibition or beta-blocker due to low output heart failure/risk of hypotension and not on SGLT2 inhibitor due to recent groin infection. In the ED, vitals stable labs showed BUN 42 creatinine 3.2 GFR,BNP 2204 (improved since labs done 2 weeks ago). CXR-not suggestive of pulmonary edema.TRH was called to admit.  She has swollen legs 2+ edema, lungs are clear, no shortess  of breath Is hard of hearing some   .A/P: AKI on CKD 3B with volume overload: B/Lcreat 1.8.No significant fluid overload on exam although BNP 01/13/2003 but better from previous 2 Question overdiuresis versus cardiorenal syndrome.  Torsemide on hold and cardiology has been consulted for further evaluation.  Follow-up urine studies, renal ultrasound.  Nephrology consulted creatinine does not improve but currently downtrending.  Will ask nephrology evaluation  since patient will need diuretics. Recent Labs    04/10/23 0603 04/11/23 0127 04/12/23 0238 04/13/23 0140 04/14/23 0129 04/15/23 0138  04/16/23 0116 04/20/23 1325 04/20/23 1758 04/21/23 0515  BUN 45* 42* 38* 46* 45* 43* 44* 55* 52* 52*  CREATININE 1.98* 1.85* 1.86* 1.97* 1.99* 1.90* 1.83* 3.10* 3.24* 3.02*  CO2 33* 32 29 32 30 32 30 33* 29 29    Acute combined systolic and diastolic CHF -EF less than 20% G3 DD: Moderate to severe MVR: Recent echo less than 20% EF with severe global hypokinesia and RWMA, G3 DD.  Does have bilateral lower leg edema but no significant shortness of breath.  Consulted cardiology, holding diuretics for now.  CAD s/p PCI 1/24 Hypertension Hyperlipidemia: BP stable, no chest pain continue Plavix, Crestor.  History of Torsades VT arrest w/ STEMI-ICD in place, on amiodarone  PAF: On Eliquis and amiodarone  Chronic pain on tramadol Constipation x 3 weeks: Add daily MiraLAX and as needed Dulcolax suppository

## 2023-04-21 NOTE — ED Notes (Signed)
Patient ambulated to bathroom with rollator with no assistance

## 2023-04-21 NOTE — ED Notes (Signed)
ED TO INPATIENT HANDOFF REPORT  ED Nurse Name and Phone #:  Marcello Moores [phone]  S Name/Age/Gender Natasha Woodard 82 y.o. female Room/Bed: 024C/024C  Code Status   Code Status: DNR  Home/SNF/Other Home Patient oriented to: self, place, time, and situation Is this baseline? Yes   Triage Complete: Triage complete  Chief Complaint AKI (acute kidney injury) [facility]) [N17.9]  Triage Note Pt referred to ED from PCP after regularly scheduled visit with lab work. Pt had critical creatinine at 3.1. Pt denies any pain or somatic symptoms during triage.    Allergies Allergies  Allergen Reactions   Chlorhexidine Gluconate Rash    Pt refuses CHG due to inflammation and rash   Ezetimibe-Simvastatin Other (See Comments)    REACTION: Muscle aches (side effect)   Lipitor [Atorvastatin] Other (See Comments)    Leg weakness    Claritin [Loratadine]     Possible cause of nightmares.     Spironolactone     Caution re: elevated potassium/creatinine   Tamiflu [Oseltamivir Phosphate] Other (See Comments)    nightmares   Pravastatin Sodium Other (See Comments)    REACTION: Muscle aches (side effect)   Sulfonamide Derivatives Nausea And Vomiting    Level of Care/Admitting Diagnosis ED Disposition     ED Disposition  Admit   Condition  --   Comment  Hospital Area: [facility] [[id number]]  Level of Care: Progressive [102]  Admit to Progressive based on following criteria: CARDIOVASCULAR & THORACIC of moderate stability with acute coronary syndrome symptoms/low risk myocardial infarction/hypertensive urgency/arrhythmias/heart failure potentially compromising stability and stable post cardiovascular intervention patients.  Admit to Progressive based on following criteria: NEPHROLOGY stable condition requiring close monitoring for AKI, requiring Hemodialysis or Peritoneal Dialysis either from expected electrolyte imbalance, acidosis, or fluid overload that can be managed by NIPPV  or high flow oxygen.  May place patient in observation at [facility] or [facility] if equivalent level of care is available:: Yes  Covid Evaluation: Asymptomatic - no recent exposure (last 10 days) testing not required  Diagnosis: AKI (acute kidney injury) [facility]) [[id number]]  Admitting Physician: John Giovanni [[id number]]  Attending Physician: John Giovanni [[id number]]          B Medical/Surgery History Past Medical History:  Diagnosis Date   AICD (automatic cardioverter/defibrillator) present    Calcific tendonitis 2008   Treatment of left leg   Cardiomyopathy (HCC) 06/2017   a) Echo: EF 25-30%. GR 1-2 DD w/ elevated LVEDP. Mild valvular Dz; b) Cardiac MRI 2/'19: frequent PVCs (diffiuclt to interpret) - EF ~27% w/ diffuse HK.  No evidence of infarct, infiltrative Dz or myocarditis. -- ? if related to PVCs. c) f/u Echo 6/'19: EF 35-40%. Gr 1 DD. Diffuse HK.-> d) Jan 2020 EF 50 to 55%.  Mod MAC, mod LAE.  Ao Sclerosis; e) Echo 1/'21 - EF 60-65%, Gr II DD. Mild Mod MR.    Chronic combined systolic and diastolic CHF, NYHA class 2 and ACA/AHA stage C    Now essentially resolved, back to simply diastolic CHF   Coronary artery disease, non-occlusive    mild-moderate CAD 06/30/17 cath   CTS (carpal tunnel syndrome)    Diabetes mellitus    Type II   Dysrhythmia    patient said that she cant remember what it is   Frequent unifocal PVCs 01/2018   Event Monitor: NSR, W/ frequent multifocal PVCs (9%-down from 30% prior to amiodarone) and PACs.  Nighttime bradycardia suggestive of OSA.';  Zio patch September  2023: PVC burden now 6.1%.   Hiatal hernia    with reflux   Hyperlipidemia    Statin intolerant   Hypertension    Ichthyosis congenita    Intermittent claudication of both lower extremities due to atherosclerosis (HCC) 08/22/2021   LEA Dopplers 09/09/2021:  Right: Total occlusion noted in the superficial femoral artery. Atherosclerosis noted throughout extremity, see note above. Three  vessel runoff.  Left: Total occlusion noted in the superficial femoral artery and/or popliteal artery. Total occlusion noted in the distal anterior tibial artery. Athereosclerosis noted throughout extremity.    NSVD (normal spontaneous vaginal delivery) 1971 & 1972   Obesity    Plantar fasciitis    Left   Presence of permanent cardiac pacemaker    Pulmonary nodule    Imaged multiple times and benign appearing   Sleep apnea    Wears glasses    Past Surgical History:  Procedure Laterality Date   ABDOMINAL HYSTERECTOMY  1975   TAH-BSO   ABSCESS DRAINAGE  1972   right breast   APPLICATION OF WOUND VAC Right 01/15/2023   Procedure: APPLICATION OF INCISIONAL WOUND VAC;  Surgeon: Victorino Sparrow, MD;  Location: [facility] OR;  Service: Vascular;  Laterality: Right;   APPLICATION OF WOUND VAC Right 01/26/2023   Procedure: APPLICATION OF WOUND VAC RIGHT GROIN WASHOUT AND SARTORIUS MUSCLE FLAP;  Surgeon: Victorino Sparrow, MD;  Location: [facility] OR;  Service: Vascular;  Laterality: Right;   ARTERY REPAIR Right 01/15/2023   Procedure: RIGHT PROFUNDA ARTERY REPAIR WITH 2000G MYRIAD MORCELLS;  Surgeon: Victorino Sparrow, MD;  Location: [facility] OR;  Service: Vascular;  Laterality: Right;   BREAST BIOPSY Left 01/14/2009   Stereo- Benign   CARDIAC MRI  01/2018   Difficult to interpret 2/2 PVCs. Normal LV size - EF ~27% with diffuse HK. Mild RV dilation - normal fxn. -- NO MYOCARDIAL LGI => no definitive evidence of prior MI, Infiltrative Dz or Myocarditis -- suspect NICM, possibly related to PVCs.   CARPAL TUNNEL RELEASE Right 07/02/2014   Procedure: RIGHT CARPAL TUNNEL RELEASE;  Surgeon: Nicki Reaper, MD;  Location: [facility];  Service: Orthopedics;  Laterality: Right;   CARPAL TUNNEL RELEASE Left 06/11/2015   Procedure: LEFT CARPAL TUNNEL RELEASE;  Surgeon: Cindee Salt, MD;  Location: [facility];  Service: Orthopedics;  Laterality: Left;  REGIONAL/FAB   COLONOSCOPY     corn removal  1968    right   CORONARY STENT INTERVENTION N/A 01/10/2023   Procedure: CORONARY STENT INTERVENTION;  Surgeon: Lennette Bihari, MD;  Location: [facility] INVASIVE CV LAB;  Service: Cardiovascular;  Laterality: N/A;   GROIN DEBRIDEMENT Right 02/06/2023   Procedure: RIGHT GROIN DEBRIDEMENT WITH WOUND VAC CHANGE;  Surgeon: Victorino Sparrow, MD;  Location: [facility] OR;  Service: Vascular;  Laterality: Right;   HEMATOMA EVACUATION Right 01/15/2023   Procedure: EVACUATION HEMATOMA RIGHT GROIN;  Surgeon: Victorino Sparrow, MD;  Location: [facility] OR;  Service: Vascular;  Laterality: Right;   Holter Monitor  06/2017   ~17,000 PVC beats - majority were singlets, some couplets. 44 brief 3-7 beat runs of NSVT. Also noted were less frequent PACs with 11 runs (longest 15 beats)   IABP INSERTION Right 01/10/2023   Procedure: IABP Insertion;  Surgeon: Lennette Bihari, MD;  Location: [facility] INVASIVE CV LAB;  Service: Cardiovascular;  Laterality: Right;   ICD IMPLANT N/A 01/23/2023   Procedure: ICD IMPLANT;  Surgeon: Marinus Maw, MD;  Location: [facility] INVASIVE CV  LAB;  Service: Cardiovascular;  Laterality: N/A;   INCISION AND DRAINAGE OF WOUND Right 01/31/2023   Procedure: IRRIGATION AND DEBRIDEMENT RIGHT GROIN WOUND AND WOUND VAC CHANGE;  Surgeon: Victorino Sparrow, MD;  Location: [facility] OR;  Service: Vascular;  Laterality: Right;   OPEN REDUCTION INTERNAL FIXATION (ORIF) DISTAL RADIAL FRACTURE Left 07/21/2017   Procedure: OPEN REDUCTION INTERNAL FIXATION (ORIF) LEFT DISTAL RADIAL FRACTURE;  Surgeon: Sheral Apley, MD;  Location: [facility] OR;  Service: Orthopedics;  Laterality: Left;   RIGHT/LEFT HEART CATH AND CORONARY ANGIOGRAPHY N/A 06/30/2017   Procedure: Right/Left Heart Cath and Coronary Angiography;  Surgeon: Marykay Lex, MD;  Location: [facility] INVASIVE CV LAB:  pRCA 55%, pCx 40%, OM1 45%, mCx 50%, D2 50% - LVEF 25-35%. Moderately elevated LVEDP(26 mmHg with PCWP 16 mmHg).  FICK CO/CI: 4.47/2.48. PA pressures 47/14 mmHg with a mean of 27 mm.    RIGHT/LEFT HEART CATH AND CORONARY ANGIOGRAPHY N/A 01/10/2023   Procedure: RIGHT/LEFT HEART CATH AND CORONARY ANGIOGRAPHY;  Surgeon: Lennette Bihari, MD;  Location: [facility] INVASIVE CV LAB;  Service: Cardiovascular;  Laterality: N/A;   ROTATOR CUFF REPAIR     left shoulder   TEMPORARY PACEMAKER N/A 01/19/2023   Procedure: TEMPORARY PACEMAKER;  Surgeon: Dolores Patty, MD;  Location: [facility] INVASIVE CV LAB;  Service: Cardiovascular;  Laterality: N/A;   TRANSTHORACIC ECHOCARDIOGRAM  06/2017   EF 25 and 30%. GR 1 DD. Mild diastolic dysfunction with elevated LVEDP. Mild valvular disease.   TRANSTHORACIC ECHOCARDIOGRAM  11'18, 6/'19    a) EF remains 30-35%.  Diffuse hypokinesis noted.  Severe LA dilation; b) Improved EF 35-40%.  Diffuse HK.  GR 1 DD.  Moderate TR.  Mild RV dilation.   TRANSTHORACIC ECHOCARDIOGRAM  01/28/2022   Follow-up echo: EF 50 to 55%.  No RWMA.  GRII DD.  Severe LA dilation.  Mild RA dilation with normal PAP and RAP.  Mild to moderate TR (no significant change noted)   Zio patch  08/2022   3-day: Currently NSR (HR range 47-91 with average 64 bpm) 1 F AVB with IVCD/BBB.  Rare isolated PACs (<1%).  Frequent PVCs (6.1% with rare couplets and triplets.  Also bigeminy.  2 atrial runs 8 beats 5 2 seconds.  No sustained arrhythmias.   ZIO Patch 14 d Event Monitor  10/2018   Results as below < 1% PVC.  (Notably improved after starting amiodarone)     A IV Location/Drains/Wounds Patient Lines/Drains/Airways Status     Active Line/Drains/Airways     Name Placement date Placement time Site Days   Peripheral IV 04/05/23 20 G 1.88" Right;Upper Arm 04/05/23  2049  Arm  16   Peripheral IV 04/21/23 20 G Left Antecubital 04/21/23  0519  Antecubital  less than 1            Intake/Output Last 24 hours No intake or output data in the 24 hours ending 04/21/23 0606  Labs/Imaging Results for orders placed or performed during the hospital encounter of 04/20/23 (from the past 48 hour(s))   CBC     Status: Abnormal   Collection Time: 04/20/23  5:58 PM  Result Value Ref Range   WBC 4.9 4.0 - 10.5 K/uL   RBC 4.37 3.87 - 5.11 MIL/uL   Hemoglobin 12.8 12.0 - 15.0 g/dL   HCT 16.1 09.6 - 04.5 %   MCV 93.4 80.0 - 100.0 fL   MCH 29.3 26.0 - 34.0 pg   MCHC 31.4 30.0 - 36.0 g/dL  RDW 16.0 (H) 11.5 - 15.5 %   Platelets 268 150 - 400 K/uL   nRBC 0.0 0.0 - 0.2 %    Comment: Performed at [facility], [address], [place], [place] [address]  Comprehensive metabolic panel     Status: Abnormal   Collection Time: 04/20/23  5:58 PM  Result Value Ref Range   Sodium 140 135 - 145 mmol/L   Potassium 3.7 3.5 - 5.1 mmol/L   Chloride 97 (L) 98 - 111 mmol/L   CO2 29 22 - 32 mmol/L   Glucose, Bld 153 (H) 70 - 99 mg/dL    Comment: Glucose reference range applies only to samples taken after fasting for at least 8 hours.   BUN 52 (H) 8 - 23 mg/dL   Creatinine, Ser 6.04 (H) 0.44 - 1.00 mg/dL   Calcium 9.6 8.9 - 54.0 mg/dL   Total Protein 7.0 6.5 - 8.1 g/dL   Albumin 3.6 3.5 - 5.0 g/dL   AST 30 15 - 41 U/L   ALT 86 (H) 0 - 44 U/L   Alkaline Phosphatase 158 (H) 38 - 126 U/L   Total Bilirubin 1.0 0.3 - 1.2 mg/dL   GFR, Estimated 14 (L) >60 mL/min    Comment: (NOTE) Calculated using the CKD-EPI Creatinine Equation (2021)    Anion gap 14 5 - 15    Comment: Performed at [facility], [address], [place], [place] [address]  Brain natriuretic peptide     Status: Abnormal   Collection Time: 04/20/23  5:58 PM  Result Value Ref Range   B Natriuretic Peptide 2,204.9 (H) 0.0 - 100.0 pg/mL    Comment: Performed at [facility], [address], [place], [place] [address]  CBG monitoring, ED     Status: Abnormal   Collection Time: 04/20/23  6:56 PM  Result Value Ref Range   Glucose-Capillary 149 (H) 70 - 99 mg/dL    Comment: Glucose reference range applies only to samples taken after fasting for at least 8 hours.  Basic metabolic panel     Status: Abnormal   Collection  Time: 04/21/23  5:15 AM  Result Value Ref Range   Sodium 139 135 - 145 mmol/L   Potassium 3.6 3.5 - 5.1 mmol/L   Chloride 96 (L) 98 - 111 mmol/L   CO2 29 22 - 32 mmol/L   Glucose, Bld 118 (H) 70 - 99 mg/dL    Comment: Glucose reference range applies only to samples taken after fasting for at least 8 hours.   BUN 52 (H) 8 - 23 mg/dL   Creatinine, Ser 9.56 (H) 0.44 - 1.00 mg/dL   Calcium 9.5 8.9 - 21.3 mg/dL   GFR, Estimated 15 (L) >60 mL/min    Comment: (NOTE) Calculated using the CKD-EPI Creatinine Equation (2021)    Anion gap 14 5 - 15    Comment: Performed at [facility], [address], [place], [place] [address]   *Note: Due to a large number of results and/or encounters for the requested time period, some results have not been displayed. A complete set of results can be found in Results Review.   DG Chest 2 View  Result Date: 04/20/2023 CLINICAL DATA:  Shortness of breath EXAM: CHEST - 2 VIEW COMPARISON:  04/08/2023 FINDINGS: Cardiac shadow is enlarged. Defibrillator is again noted. Aortic calcifications are seen. Previously noted right central venous catheter has been removed. Lungs are clear. No acute bony abnormality is  noted. IMPRESSION: Stable cardiomegaly.  No acute abnormality noted. Electronically Signed   By: Alcide Clever M.D.   On: 04/20/2023 23:31    Pending Labs Unresulted Labs (From admission, onward)     Start     Ordered   04/21/23 0502  Sodium, urine, random  Once,   R        04/21/23 0502   04/21/23 0502  Creatinine, urine, random  Once,   R        04/21/23 0502   04/20/23 2255  Urinalysis, Routine w reflex microscopic -Urine, Clean Catch  Once,   URGENT       Question:  Specimen Source  Answer:  Urine, Clean Catch   04/20/23 2254            Vitals/Pain Today's Vitals   04/20/23 1736 04/20/23 2314 04/21/23 0200 04/21/23 0258  BP:  132/87 126/89   Pulse:  73 90   Resp:  20 12   Temp:  97.8 F (36.6 C)  98.1 F (36.7 C)  TempSrc:  Oral   Oral  SpO2:  96% 96%   PainSc: 0-No pain       Isolation Precautions No active isolations  Medications Medications  traMADol (ULTRAM) tablet 50 mg (has no administration in time range)  amiodarone (PACERONE) tablet 200 mg (has no administration in time range)  rosuvastatin (CRESTOR) tablet 10 mg (has no administration in time range)  apixaban (ELIQUIS) tablet 2.5 mg (has no administration in time range)  clopidogrel (PLAVIX) tablet 75 mg (has no administration in time range)  acetaminophen (TYLENOL) tablet 650 mg (has no administration in time range)    Or  acetaminophen (TYLENOL) suppository 650 mg (has no administration in time range)    Mobility walks with device     Focused Assessments Renal Assessment Handoff:    R Recommendations: See Admitting Provider Note  Report given to:   Additional Notes:

## 2023-04-22 DIAGNOSIS — I272 Pulmonary hypertension, unspecified: Secondary | ICD-10-CM | POA: Insufficient documentation

## 2023-04-22 DIAGNOSIS — I5021 Acute systolic (congestive) heart failure: Secondary | ICD-10-CM | POA: Diagnosis not present

## 2023-04-22 DIAGNOSIS — I5020 Unspecified systolic (congestive) heart failure: Secondary | ICD-10-CM | POA: Insufficient documentation

## 2023-04-22 DIAGNOSIS — I480 Paroxysmal atrial fibrillation: Secondary | ICD-10-CM | POA: Diagnosis not present

## 2023-04-22 DIAGNOSIS — N179 Acute kidney failure, unspecified: Secondary | ICD-10-CM | POA: Diagnosis not present

## 2023-04-22 LAB — BASIC METABOLIC PANEL
Anion gap: 13 (ref 5–15)
BUN: 53 mg/dL — ABNORMAL HIGH (ref 8–23)
CO2: 31 mmol/L (ref 22–32)
Calcium: 9.5 mg/dL (ref 8.9–10.3)
Chloride: 97 mmol/L — ABNORMAL LOW (ref 98–111)
Creatinine, Ser: 2.84 mg/dL — ABNORMAL HIGH (ref 0.44–1.00)
GFR, Estimated: 16 mL/min — ABNORMAL LOW (ref >60)
Glucose, Bld: 138 mg/dL — ABNORMAL HIGH (ref 70–99)
Potassium: 3.5 mmol/L (ref 3.5–5.1)
Sodium: 141 mmol/L (ref 135–145)

## 2023-04-22 LAB — LACTIC ACID, PLASMA
Lactic Acid, Venous: 2.3 mmol/L (ref 0.5–1.9)
Lactic Acid, Venous: 2.5 mmol/L (ref 0.5–1.9)

## 2023-04-22 LAB — GLUCOSE, CAPILLARY
Glucose-Capillary: 122 mg/dL — ABNORMAL HIGH (ref 70–99)
Glucose-Capillary: 204 mg/dL — ABNORMAL HIGH (ref 70–99)
Glucose-Capillary: 73 mg/dL (ref 70–99)
Glucose-Capillary: 143 mg/dL — ABNORMAL HIGH (ref 70–99)

## 2023-04-22 LAB — MAGNESIUM: Magnesium: 2.2 mg/dL (ref 1.7–2.4)

## 2023-04-22 MED ORDER — POTASSIUM CHLORIDE CRYS ER 20 MEQ PO TBCR
40.0000 meq | EXTENDED_RELEASE_TABLET | Freq: Once | ORAL | Status: AC
  Administered 2023-04-22: 40 meq via ORAL
  Filled 2023-04-22: qty 2

## 2023-04-22 NOTE — Progress Notes (Signed)
BP 104/53. 80 mg IV lasix held. Kristeen Miss, MD notified and acknowledged.  Roderic Scarce, RN

## 2023-04-22 NOTE — Progress Notes (Signed)
[facility] PROGRESS NOTE   Natasha Woodard NWG:[id number] DOB: 09/24/1941 DOA: 04/20/2023  PCP: [name], MD  Brief History/Interval Summary: 18 yof w/ complex medical history including chronic HFrEF (EF less than 20%), moderate to severe MR, CAD status post PCI 12/2022, hypertension, hyperlipidemia, paroxysmal A-fib on Eliquis, history of VT arrest, status post ICD, CKD stage IIIb, pseudoaneurysm of right groin. Recently admitted 4/24-5/5 for decompensated CHF, AKI, and septic shock secondary to UTI who was discharged on torsemide 40 mg daily seen by PCP 5/9 and sent to the ED due to concern for AKI.  She was noted to be volume overloaded.  Hospitalize for further management.  Consultants: Cardiology.  Nephrology  Procedures: None    Subjective/Interval History: Patient complains of shortness of breath but seems to be better compared to yesterday.  No chest pain.  No nausea or vomiting.  Husband is at the bedside.    Assessment/Plan:  Acute combined systolic and diastolic CHF EF is less than 20%.  Patient also with moderate to severe mitral valve regurgitation. Cardiology is following.  Patient started on milrinone infusion last night.  Strict ins and outs and daily weights. Patient also on furosemide 80 mg twice a day.  Cannot give any ACE inhibitor or ARB due to her acute kidney injury.  Acute kidney injury on chronic kidney disease stage IIIb Nephrology was consulted.  Torsemide placed on hold.  Patient started on milrinone and furosemide as discussed above. Renal function seems to be better today compared to yesterday. Looks like up until March of this year her baseline creatinine was around 1.6.  Since then it has been fluctuating significantly.  Creatinine noted to be 4.4 on 04/05/2023.  Had improved to 1.83 on 5/5.  Came in on 5/9 with a creatinine of 3.1.  Noted to be 2.84 today. Renal ultrasound did not show any hydronephrosis.  Coronary artery disease  status post PCI in January 2024/history of torsades VT arrest Stable.  Continue Plavix. Continue amiodarone.  Essential hypertension/hyperlipidemia Stable.  Noted to be on rosuvastatin.  Paroxysmal atrial fibrillation On amiodarone and apixaban.  Chronic pain On tramadol  Constipation Continue bowel regimen.  DVT Prophylaxis: On apixaban Code Status: DNR Family Communication: Discussed with patient and her husband Disposition Plan: Hopefully return home when improved  Status is: Inpatient Remains inpatient appropriate because: Acute CHF, acute kidney injury      Medications: Scheduled:  amiodarone  200 mg Oral Daily   apixaban  2.5 mg Oral BID   clopidogrel  75 mg Oral Daily   furosemide  80 mg Intravenous Q12H   insulin aspart  0-9 Units Subcutaneous TID WC   polyethylene glycol  17 g Oral Daily   rosuvastatin  10 mg Oral Daily   traMADol  50 mg Oral BID   Continuous:  milrinone 0.25 mcg/kg/min (04/21/23 1909)   VHQ:[id number] **OR** acetaminophen, bisacodyl  Antibiotics: Anti-infectives (From admission, onward)    None       Objective:  Vital Signs  Vitals:   04/21/23 1626 04/21/23 2149 04/22/23 0028 04/22/23 0535  BP: 118/79 111/69 115/64   Pulse: 70 70 70   Resp: 18   18  Temp: 97.9 F (36.6 C) 97.6 F (36.4 C)  (!) 97.5 F (36.4 C)  TempSrc: Oral Oral  Oral  SpO2: 95% 98% 100% 100%  Weight: 73.3 kg       Intake/Output Summary (Last 24 hours) at 04/22/2023 0943 Last data filed at 04/21/2023 2300 Gross per  24 hour  Intake --  Output 600 ml  Net -600 ml   Filed Weights   04/21/23 1626  Weight: 73.3 kg    General appearance: Awake alert.  In no distress Resp: Crackles bilateral bases.  No wheezing or rhonchi.  Normal effort at rest. Cardio: S1-S2 is normal regular.  No S3-S4.  No rubs murmurs or bruit GI: Abdomen is soft.  Nontender nondistended.  Bowel sounds are present normal.  No masses organomegaly Extremities: Edema  bilateral lower extremities Neurologic: Alert and oriented x3.  No focal neurological deficits.    Lab Results:  Data Reviewed: I have personally reviewed following labs and reports of the imaging studies  CBC: Recent Labs  Lab 04/20/23 1325 04/20/23 1758  WBC 4.6 4.9  NEUTROABS 3.1  --   HGB 12.5 12.8  HCT 39.3 40.8  MCV 91.4 93.4  PLT 271.0 268    Basic Metabolic Panel: Recent Labs  Lab 04/16/23 0116 04/20/23 1325 04/20/23 1758 04/21/23 0515 04/22/23 0207  NA 140 142 140 139 141  K 4.1 4.5 3.7 3.6 3.5  CL 101 95* 97* 96* 97*  CO2 30 33* 29 29 31   GLUCOSE 109* 155* 153* 118* 138*  BUN 44* 55* 52* 52* 53*  CREATININE 1.83* 3.10* 3.24* 3.02* 2.84*  CALCIUM 9.4 10.1 9.6 9.5 9.5  MG 1.8  --   --   --  2.2    GFR: Estimated Creatinine Clearance: 14.3 mL/min (A) (by C-G formula based on SCr of 2.84 mg/dL (H)).  Liver Function Tests: Recent Labs  Lab 04/20/23 1325 04/20/23 1758  AST 28 30  ALT 84* 86*  ALKPHOS 158* 158*  BILITOT 1.0 1.0  PROT 7.0 7.0  ALBUMIN 3.9 3.6    CBG: Recent Labs  Lab 04/21/23 0805 04/21/23 1219 04/21/23 1615 04/21/23 2225 04/22/23 0811  GLUCAP 145* 139* 123* 110* 122*     Radiology Studies: US RENAL  Result Date: 04/21/2023 CLINICAL DATA:  Acute kidney injury EXAM: RENAL / URINARY TRACT ULTRASOUND COMPLETE COMPARISON:  04/06/2023 right upper quadrant ultrasound. FINDINGS: Right Kidney: Renal measurements: 10 x 4 x 5 cm no hydronephrosis. Echogenic area at the anterior cortex is perinephric fat by prior CT. No concerning mass. No hydronephrosis. Left Kidney: Renal measurements: 10 x 5 x 5 cm no hydronephrosis or lesion seen. Suboptimal visualization due to shadowing colonic gas. Bladder: Not visualized. IMPRESSION: No acute finding.  No hydronephrosis. Electronically Signed   By: Tiburcio Pea M.D.   On: 04/21/2023 06:17   DG Chest 2 View  Result Date: 04/20/2023 CLINICAL DATA:  Shortness of breath EXAM: CHEST - 2 VIEW  COMPARISON:  04/08/2023 FINDINGS: Cardiac shadow is enlarged. Defibrillator is again noted. Aortic calcifications are seen. Previously noted right central venous catheter has been removed. Lungs are clear. No acute bony abnormality is noted. IMPRESSION: Stable cardiomegaly.  No acute abnormality noted. Electronically Signed   By: Alcide Clever M.D.   On: 04/20/2023 23:31       LOS: 1 day   Carmencita Cusic  [facility] Pager on [email or web address]  04/22/2023, 9:43 AM

## 2023-04-22 NOTE — Progress Notes (Addendum)
Rounding Note    Patient Name: Natasha Woodard Date of Encounter: 04/22/2023  [facility] Cardiologist: Bryan Lemma, MD   Subjective   Patient reports she feels the same as yesterday. Breathing OK. Ankle edema has improved. She has been urinating a lot on lasix. Husband at bedside is upset that she has been stuck so many times to draw blood.   Inpatient Medications    Scheduled Meds:  amiodarone  200 mg Oral Daily   apixaban  2.5 mg Oral BID   clopidogrel  75 mg Oral Daily   furosemide  80 mg Intravenous Q12H   insulin aspart  0-9 Units Subcutaneous TID WC   polyethylene glycol  17 g Oral Daily   rosuvastatin  10 mg Oral Daily   traMADol  50 mg Oral BID   Continuous Infusions:  milrinone 0.25 mcg/kg/min (04/21/23 1909)   PRN Meds: acetaminophen **OR** acetaminophen, bisacodyl   Vital Signs    Vitals:   04/21/23 1626 04/21/23 2149 04/22/23 0028 04/22/23 0535  BP: 118/79 111/69 115/64   Pulse: 70 70 70   Resp: 18   18  Temp: 97.9 F (36.6 C) 97.6 F (36.4 C)  (!) 97.5 F (36.4 C)  TempSrc: Oral Oral  Oral  SpO2: 95% 98% 100% 100%  Weight: 73.3 kg       Intake/Output Summary (Last 24 hours) at 04/22/2023 0901 Last data filed at 04/21/2023 2300 Gross per 24 hour  Intake --  Output 600 ml  Net -600 ml      04/21/2023    4:26 PM 04/20/2023   12:30 PM 04/15/2023    6:24 AM  Last 3 Weights  Weight (lbs) 161 lb 11.2 oz 160 lb 178 lb 5.6 oz  Weight (kg) 73.347 kg 72.576 kg 80.9 kg      Telemetry    Paced rhythm - Personally Reviewed  ECG    No new tracings since 5/9 - Personally Reviewed  Physical Exam   GEN: No acute distress. Sitting in the bed with head elevated    Neck: No JVD Cardiac: RRR. No murmurs  Respiratory: Breathing unlabored  GI: Soft, nontender, non-distended  MS: 1-2+ lower extremity edema; No deformity. Neuro:  Nonfocal  Psych: Normal affect   Labs    High Sensitivity Troponin:   Recent Labs  Lab 03/31/23 0706  03/31/23 0930 04/05/23 1820 04/05/23 2050  TROPONINIHS 45* 40* 75* 76*     Chemistry Recent Labs  Lab 04/16/23 0116 04/20/23 1325 04/20/23 1758 04/21/23 0515 04/22/23 0207  NA 140 142 140 139 141  K 4.1 4.5 3.7 3.6 3.5  CL 101 95* 97* 96* 97*  CO2 30 33* 29 29 31   GLUCOSE 109* 155* 153* 118* 138*  BUN 44* 55* 52* 52* 53*  CREATININE 1.83* 3.10* 3.24* 3.02* 2.84*  CALCIUM 9.4 10.1 9.6 9.5 9.5  MG 1.8  --   --   --  2.2  PROT  --  7.0 7.0  --   --   ALBUMIN  --  3.9 3.6  --   --   AST  --  28 30  --   --   ALT  --  84* 86*  --   --   ALKPHOS  --  158* 158*  --   --   BILITOT  --  1.0 1.0  --   --   GFRNONAA 27*  --  14* 15* 16*  ANIONGAP 9  --  14 14  13    Lipids No results for input(s): "CHOL", "TRIG", "HDL", "LABVLDL", "LDLCALC", "CHOLHDL" in the last 168 hours.  Hematology Recent Labs  Lab 04/20/23 1325 04/20/23 1758  WBC 4.6 4.9  RBC 4.29 4.37  HGB 12.5 12.8  HCT 39.3 40.8  MCV 91.4 93.4  MCH  --  29.3  MCHC 31.7 31.4  RDW 16.8* 16.0*  PLT 271.0 268   Thyroid No results for input(s): "TSH", "FREET4" in the last 168 hours.  BNP Recent Labs  Lab 04/20/23 1758  BNP 2,204.9*    DDimer No results for input(s): "DDIMER" in the last 168 hours.   Radiology    US RENAL  Result Date: 04/21/2023 CLINICAL DATA:  Acute kidney injury EXAM: RENAL / URINARY TRACT ULTRASOUND COMPLETE COMPARISON:  04/06/2023 right upper quadrant ultrasound. FINDINGS: Right Kidney: Renal measurements: 10 x 4 x 5 cm no hydronephrosis. Echogenic area at the anterior cortex is perinephric fat by prior CT. No concerning mass. No hydronephrosis. Left Kidney: Renal measurements: 10 x 5 x 5 cm no hydronephrosis or lesion seen. Suboptimal visualization due to shadowing colonic gas. Bladder: Not visualized. IMPRESSION: No acute finding.  No hydronephrosis. Electronically Signed   By: Tiburcio Pea M.D.   On: 04/21/2023 06:17   DG Chest 2 View  Result Date: 04/20/2023 CLINICAL DATA:  Shortness  of breath EXAM: CHEST - 2 VIEW COMPARISON:  04/08/2023 FINDINGS: Cardiac shadow is enlarged. Defibrillator is again noted. Aortic calcifications are seen. Previously noted right central venous catheter has been removed. Lungs are clear. No acute bony abnormality is noted. IMPRESSION: Stable cardiomegaly.  No acute abnormality noted. Electronically Signed   By: Alcide Clever M.D.   On: 04/20/2023 23:31    Cardiac Studies   Echocardiogram 04/06/23   1. Left ventricular ejection fraction, by estimation, is <20%. The left  ventricle has severely decreased function. The left ventricle demonstrate  severe global hypokinesis and regional wall motion abnormalities (see  scoring diagram/findings for  description). The left ventricular internal cavity size was mildly  dilated. Left ventricular diastolic parameters are consistent with Grade  III diastolic dysfunction (restrictive). Elevated left ventricular  end-diastolic pressure. There is akinesis of  the left ventricular, mid-apical lateral wall and inferolateral wall.  There is akinesis of the left ventricular, apical segment.   2. Right ventricular systolic function is moderately reduced. The right  ventricular size is normal. There is severely elevated pulmonary artery  systolic pressure. The estimated right ventricular systolic pressure is  72.5 mmHg.   3. Left atrial size was severely dilated.   4. Right atrial size was mildly dilated.   5. Based on eccentricity of MR and akinesis of the inferolateral and  lateral walls, suspect this is ischemic MR due to restricted posterior  mitral valve leaflet.. The mitral valve is abnormal. Moderate to severe  mitral valve regurgitation. No evidence  of mitral stenosis.   6. The aortic valve is tricuspid. Aortic valve regurgitation is trivial.  Aortic valve sclerosis/calcification is present, without any evidence of  aortic stenosis. Aortic valve area, by VTI measures 1.81 cm. Aortic valve  mean  gradient measures 3.0 mmHg.   Aortic valve Vmax measures 1.15 m/s.   7. The inferior vena cava is dilated in size with <50% respiratory  variability, suggesting right atrial pressure of 15 mmHg.   Patient Profile     82 y.o. female with a hx of STEMI in Jan 2024 with PCI, cardiogenic shock, hx freq PVCs thought to have added  to prior NSTEMI before 2024, CAD, HTN, HLD, DM-2, PAD, CKD-3, OSA, PAF, and ICD placed 01/2023  who is being seen for the evaluation of CHF Assessment & Plan    Acute on Chronic Combined Systolic and Diastolic Heart Failure  RV dysfunction  Severe pulmonary artery HTN  Moderate-severe Mitral Valve Regurgitation  - Most recent echocardiogram from 04/06/23 showed EF <20% with severe global hypokinesis, regional wall motion abnormalities. There was also grade III diastolic dysfunction, moderately reduced RV systolic function, severely elevated pulmonary artery systolic pressure, moderate-severe mitral valve regurgitation - Patient has had several recent admissions, most recently from 4/24-5/5 with sepsis complicated by AKI and CHF. When seen as an outpatient by her PCP, her creatinine had bumped from 1.8-3 and she was readmitted  - Yesterday, her lactic acid was 2.8. Likely low flow state in the setting of severe LV/RV dysfunction, severe MR, severe pulmonary HTN.  - In the past, patient has had multiple conversations with the advanced heart failure team regarding palliative measures. Patient and family have declined  - Yesterday, Dr. Shari Prows had multiple conversations with patient and family regarding goals of care. She expressed her concerns about patient's poor prognosis given severe systolic dysfunction, RV dysfunction, severe pulmonary HTN, severe MR, and frequent re-hospitalizations. Despite taking her medications as prescribed, patient is in a l ow output state. Patient seemed open to idea of palliative care, but husband wished to trial milrinone  - Ordered lactic acid to  be drawn this AM  - BP and renal function limit GDMT  - Continue IV lasix 80 mg BID. Strict I/Os, daily weights. Needs daily BMPs to assess renal function while on lasix   AKI  - As above, likely due to low-output heart failure  - Nephrology consulted   CAD  - Patient chest pain free today  - Continue plavix. Not on ASA due to eliquis use  - Continue crestor 10 mg daily  - No BB given concern for low-output heart failure   PAF - Telemetry shows sinus rhythm with atrial pacing and v sensing  - Continue amiodarone 200 mg daily  - Continue eliquis 2.5 mg BID (dose reduced for age, renal function)   For questions or updates, please contact [facility] Please consult [email or web address] for contact info under        Signed, Jonita Albee, PA-C  04/22/2023, 9:01 AM     Attending Note:   The patient was seen and examined.  Agree with assessment and plan as noted above.  Changes made to the above note as needed.  Patient seen and independently examined with Robet Leu, PA .   We discussed all aspects of the encounter. I agree with the assessment and plan as stated above.    Acute on chronic combined CHF:   EF is markedly reduced.   Is doing better on Milrinone.   Is urinating quite a bit .   Creatinine is improving  Overall her prognosis is poor .  She is feeling better on the Milrinone .  Will   2.  MR :  severe MR .     3.  TR with severe pulmonary HTN:         I have spent a total of 40 minutes with patient reviewing hospital  notes , telemetry, EKGs, labs and examining patient as well as establishing an assessment and plan that was discussed with the patient.  > 50% of time was spent in direct patient care.  Vesta Mixer, Montez Hageman., MD, [facility] 04/22/2023, 3:51 PM [address]  [address] Office [phone] Pager [phone]

## 2023-04-22 NOTE — Evaluation (Signed)
Occupational Therapy Evaluation Patient Details Name: Natasha Woodard MRN: [id number] DOB: 01-Oct-1941 Today's Date: 04/22/2023   History of Present Illness 82 y.o. female presents to [facility] hospital on 04/20/2023, referred by PCP due to concern fro AKI. Pt was recently admitted 4/24-5/5 for decompensated CHF, AKI, sepsis 2/2 UTI. PMH: NSTEMI and cardiogenic shock with a complicated and prolonged admission 1/30-3/10/24,  HFrEF, A-fib, AKI on CKD IVOSA, HTN, HLD   Clinical Impression   Pt admitted with the above diagnoses and presents with below problem list. Pt will benefit from continued acute OT to address the below listed deficits and maximize independence with basic ADLs prior to d/c home. At baseline, pt is mod I with ADLs. Pt currently needs up to min guard assist with LB ADLs. Pt able to walk length of hallway and back with seated rest break utilized at halfway point. HR in the 70s, unable to get reliable SpO2, pt appeared in no acute distress, denied DOE/SOB.       Recommendations for follow up therapy are one component of a multi-disciplinary discharge planning process, led by the attending physician.  Recommendations may be updated based on patient status, additional functional criteria and insurance authorization.   Assistance Recommended at Discharge Intermittent Supervision/Assistance  Patient can return home with the following      Functional Status Assessment  Patient has had a recent decline in their functional status and demonstrates the ability to make significant improvements in function in a reasonable and predictable amount of time.  Equipment Recommendations  None recommended by OT    Recommendations for Other Services       Precautions / Restrictions Precautions Precautions: Fall;Other (comment) Precaution Comments: monitor SpO2 Restrictions Weight Bearing Restrictions: No      Mobility Bed Mobility Overal bed mobility: Modified Independent                   Transfers Overall transfer level: Modified independent Equipment used: Rollator (4 wheels) Transfers: Sit to/from Stand Sit to Stand: Supervision, Modified independent (Device/Increase time)                  Balance Overall balance assessment: Needs assistance Sitting-balance support: No upper extremity supported, Feet supported Sitting balance-Leahy Scale: Good     Standing balance support: Single extremity supported, Reliant on assistive device for balance Standing balance-Leahy Scale: Poor                             ADL either performed or assessed with clinical judgement   ADL Overall ADL's : Needs assistance/impaired Eating/Feeding: Modified independent;Sitting   Grooming: Supervision/safety;Standing   Upper Body Bathing: Set up;Sitting   Lower Body Bathing: Min guard;Sit to/from stand   Upper Body Dressing : Set up;Sitting   Lower Body Dressing: Supervision/safety;Sitting/lateral leans   Toilet Transfer: Min guard;Ambulation   Toileting- Clothing Manipulation and Hygiene: Sit to/from stand;Min guard       Functional mobility during ADLs: Min guard General ADL Comments: Pt able to walk full length of hallway, one seated rest break at halfway point.     Vision         Perception     Praxis      Pertinent Vitals/Pain Pain Assessment Pain Assessment: No/denies pain     Hand Dominance Right   Extremity/Trunk Assessment Upper Extremity Assessment Upper Extremity Assessment: Overall WFL for tasks assessed   Lower Extremity Assessment Lower Extremity Assessment: Defer to PT evaluation  Communication Communication Communication: No difficulties   Cognition Arousal/Alertness: Awake/alert Behavior During Therapy: WFL for tasks assessed/performed Overall Cognitive Status: Within Functional Limits for tasks assessed                                       General Comments       Exercises     Shoulder  Instructions      Home Living Family/patient expects to be discharged to:: Private residence Living Arrangements: Spouse/significant other Available Help at Discharge: Family;Available 24 hours/day Type of Home: House Home Access: Stairs to enter [employer] of Steps: 2 Entrance Stairs-Rails: Right;Left Home Layout: Two level;Bed/bath upstairs Alternate Level Stairs-Number of Steps: 14 Alternate Level Stairs-Rails: Left;Right Bathroom Shower/Tub: Chief Strategy Officer: Standard Bathroom Accessibility: Yes   Home Equipment: Rollator (4 wheels);Cane - single point          Prior Functioning/Environment Prior Level of Function : Independent/Modified Independent;Driving             Mobility Comments: Mod I uses Rollator ADLs Comments: independent in bADLs/iADLs        OT Problem List: Decreased strength;Decreased activity tolerance;Impaired balance (sitting and/or standing);Cardiopulmonary status limiting activity      OT Treatment/Interventions: Self-care/ADL training;Therapeutic exercise;DME and/or AE instruction;Patient/family education;Balance training;Therapeutic activities    OT Goals(Current goals can be found in the care plan section) Acute Rehab OT Goals Patient Stated Goal: home OT Goal Formulation: With patient Time For Goal Achievement: 04/29/23 Potential to Achieve Goals: Good ADL Goals Pt Will Perform Grooming: with modified independence;standing Pt Will Perform Upper Body Dressing: Independently;sitting;with modified independence Pt Will Perform Lower Body Dressing: with modified independence;sit to/from stand Pt Will Transfer to Toilet: with modified independence;ambulating Pt Will Perform Toileting - Clothing Manipulation and hygiene: with modified independence;sit to/from stand  OT Frequency: Min 2X/week    Co-evaluation              AM-PAC OT "6 Clicks" Daily Activity     Outcome Measure Help from another person  eating meals?: None Help from another person taking care of personal grooming?: None Help from another person toileting, which includes using toliet, bedpan, or urinal?: A Little Help from another person bathing (including washing, rinsing, drying)?: A Little Help from another person to put on and taking off regular upper body clothing?: A Little Help from another person to put on and taking off regular lower body clothing?: A Little 6 Click Score: 20   End of Session Equipment Utilized During Treatment: Rollator (4 wheels)  Activity Tolerance: Patient tolerated treatment well Patient left: with call bell/phone within reach;in bed  OT Visit Diagnosis: Unsteadiness on feet (R26.81);Muscle weakness (generalized) (M62.81)                Time: 1610-9604 OT Time Calculation (min): 39 min Charges:  OT General Charges $OT Visit: 1 Visit OT Evaluation $OT Eval Low Complexity: 1 Low OT Treatments $Self Care/Home Management : 23-37 mins  Raynald Kemp, OT Acute Rehabilitation Services Office: [phone]   Pilar Grammes 04/22/2023, 4:22 PM

## 2023-04-22 NOTE — Progress Notes (Signed)
Nephrology Follow-Up Consult note   Assessment/Recommendations: Natasha Woodard is a/an 82 y.o. female with a past medical history significant for HFrEF, CAD, PAD, DM2, OSA, CKD3b admitted for AKI on CKD 3b.          Non-Oliguric AKI on CKD 3b: AKI essentially directly related to the heart.  She is on low output state.  If her heart function worsens her kidney function will worsen.  We have minimal input at this time.  She is not a dialysis candidate and her kidney function will worsen if her cardiac function worsens.  Would recommend palliative efforts and likely hospice   HFrEF: recent EF <20%.  Evidence-based medical therapy limited in the past for multiple reasons as.  Cardiology following.  She is currently on milrinone.  We had nothing to add at this time.  As stated above, kidney function is direct related to her heart function.      Uncontrolled Diabetes Mellitus Type 2 with Hyperglycemia:; Management per primary team   CAD: Fairly extensive history.  Plavix and Eliquis  We will sign off at this time.  Recommend palliative care.   Recommendations conveyed to primary service.    Darnell Level [facility] 04/22/2023 11:06 AM  ___________________________________________________________  CC: Low output heart failure  Interval History/Subjective: Patient has no complaints.  Started on milrinone.  Also undergoing some diuresis.  Creatinine slightly improved.   Medications:  Current Facility-Administered Medications  Medication Dose Route Frequency Provider Last Rate Last Admin   acetaminophen (TYLENOL) tablet 650 mg  650 mg Oral Q6H PRN John Giovanni, MD       Or   acetaminophen (TYLENOL) suppository 650 mg  650 mg Rectal Q6H PRN John Giovanni, MD       amiodarone (PACERONE) tablet 200 mg  200 mg Oral Daily John Giovanni, MD   200 mg at 04/22/23 0908   apixaban (ELIQUIS) tablet 2.5 mg  2.5 mg Oral BID John Giovanni, MD   2.5 mg at  04/22/23 0854   bisacodyl (DULCOLAX) suppository 10 mg  10 mg Rectal Daily PRN Lanae Boast, MD       clopidogrel (PLAVIX) tablet 75 mg  75 mg Oral Daily John Giovanni, MD   75 mg at 04/22/23 0854   furosemide (LASIX) injection 80 mg  80 mg Intravenous Q12H Meriam Sprague, MD   80 mg at 04/22/23 0456   insulin aspart (novoLOG) injection 0-9 Units  0-9 Units Subcutaneous TID WC Kc, Dayna Barker, MD   1 Units at 04/22/23 0853   milrinone (PRIMACOR) 20 MG/100 ML (0.2 mg/mL) infusion  0.25 mcg/kg/min Intravenous Continuous Meriam Sprague, MD 5.45 mL/hr at 04/21/23 1909 0.25 mcg/kg/min at 04/21/23 1909   polyethylene glycol (MIRALAX / GLYCOLAX) packet 17 g  17 g Oral Daily Kc, Dayna Barker, MD   17 g at 04/22/23 0854   rosuvastatin (CRESTOR) tablet 10 mg  10 mg Oral Daily John Giovanni, MD   10 mg at 04/22/23 0854   traMADol (ULTRAM) tablet 50 mg  50 mg Oral BID John Giovanni, MD   50 mg at 04/22/23 0854      Review of Systems: 10 systems reviewed and negative except per interval history/subjective  Physical Exam: Vitals:   04/22/23 0028 04/22/23 0535  BP: 115/64   Pulse: 70   Resp:  18  Temp:  (!) 97.5 F (36.4 C)  SpO2: 100% 100%   Total I/O In: 360 [P.O.:360] Out: -   Intake/Output Summary (Last 24 hours)  at 04/22/2023 1106 Last data filed at 04/22/2023 0900 Gross per 24 hour  Intake 360 ml  Output 600 ml  Net -240 ml   Constitutional: Chronically ill., no acute distress ENMT: ears and nose without scars or lesions, MMM CV: normal rate, 1+ edema in the bilateral lower extremities Respiratory: Bilateral rise with no increased work of breathing Gastrointestinal: soft, non-tender, no palpable masses or hernias Skin: no visible lesions or rashes Psych: alert, judgement/insight appropriate, appropriate mood and affect   Test Results I personally reviewed new and old clinical labs and radiology tests Lab Results  Component Value Date   NA 141 04/22/2023   K 3.5  04/22/2023   CL 97 (L) 04/22/2023   CO2 31 04/22/2023   BUN 53 (H) 04/22/2023   CREATININE 2.84 (H) 04/22/2023   GFR 13.48 (LL) 04/20/2023   CALCIUM 9.5 04/22/2023   ALBUMIN 3.6 04/20/2023   PHOS 2.5 04/14/2023    CBC Recent Labs  Lab 04/20/23 1325 04/20/23 1758  WBC 4.6 4.9  NEUTROABS 3.1  --   HGB 12.5 12.8  HCT 39.3 40.8  MCV 91.4 93.4  PLT 271.0 268

## 2023-04-22 NOTE — Progress Notes (Signed)
Error

## 2023-04-23 DIAGNOSIS — N179 Acute kidney failure, unspecified: Secondary | ICD-10-CM | POA: Diagnosis not present

## 2023-04-23 DIAGNOSIS — I5021 Acute systolic (congestive) heart failure: Secondary | ICD-10-CM | POA: Diagnosis not present

## 2023-04-23 DIAGNOSIS — I272 Pulmonary hypertension, unspecified: Secondary | ICD-10-CM | POA: Diagnosis not present

## 2023-04-23 DIAGNOSIS — I480 Paroxysmal atrial fibrillation: Secondary | ICD-10-CM | POA: Diagnosis not present

## 2023-04-23 DIAGNOSIS — I5043 Acute on chronic combined systolic (congestive) and diastolic (congestive) heart failure: Secondary | ICD-10-CM | POA: Diagnosis not present

## 2023-04-23 LAB — MAGNESIUM: Magnesium: 2.1 mg/dL (ref 1.7–2.4)

## 2023-04-23 LAB — BASIC METABOLIC PANEL
Anion gap: 10 (ref 5–15)
BUN: 50 mg/dL — ABNORMAL HIGH (ref 8–23)
CO2: 35 mmol/L — ABNORMAL HIGH (ref 22–32)
Calcium: 9.4 mg/dL (ref 8.9–10.3)
Chloride: 97 mmol/L — ABNORMAL LOW (ref 98–111)
Creatinine, Ser: 2.66 mg/dL — ABNORMAL HIGH (ref 0.44–1.00)
GFR, Estimated: 17 mL/min — ABNORMAL LOW (ref >60)
Glucose, Bld: 113 mg/dL — ABNORMAL HIGH (ref 70–99)
Potassium: 4.7 mmol/L (ref 3.5–5.1)
Sodium: 142 mmol/L (ref 135–145)

## 2023-04-23 LAB — GLUCOSE, CAPILLARY
Glucose-Capillary: 125 mg/dL — ABNORMAL HIGH (ref 70–99)
Glucose-Capillary: 247 mg/dL — ABNORMAL HIGH (ref 70–99)
Glucose-Capillary: 119 mg/dL — ABNORMAL HIGH (ref 70–99)
Glucose-Capillary: 175 mg/dL — ABNORMAL HIGH (ref 70–99)

## 2023-04-23 NOTE — Progress Notes (Signed)
[facility] PROGRESS NOTE   RUHANI MEIJER BJY:[id number] DOB: 03/09/1941 DOA: 04/20/2023  PCP: [name], MD  Brief History/Interval Summary: 35 yof w/ complex medical history including chronic HFrEF (EF less than 20%), moderate to severe MR, CAD status post PCI 12/2022, hypertension, hyperlipidemia, paroxysmal A-fib on Eliquis, history of VT arrest, status post ICD, CKD stage IIIb, pseudoaneurysm of right groin. Recently admitted 4/24-5/5 for decompensated CHF, AKI, and septic shock secondary to UTI who was discharged on torsemide 40 mg daily seen by PCP 5/9 and sent to the ED due to concern for AKI.  She was noted to be volume overloaded.  Hospitalize for further management.  Consultants: Cardiology.  Nephrology  Procedures: None    Subjective/Interval History: Patient mentions that she is feeling better.  No chest pain shortness of breath reported this morning.  No nausea or vomiting.  Husband is at the bedside.     Assessment/Plan:  Acute combined systolic and diastolic CHF EF is less than 20%.  Patient also with moderate to severe mitral valve regurgitation. Cardiology is following.  Patient started on milrinone infusion.   Strict ins and outs and daily weights. Patient also on furosemide 80 mg twice a day.   Cannot give any ACE inhibitor or ARB due to her acute kidney injury.  Acute kidney injury on chronic kidney disease stage IIIb Nephrology was consulted.  Torsemide placed on hold.  Patient started on milrinone and furosemide as discussed above. Looks like up until March of this year her baseline creatinine was around 1.6.  Since then it has been fluctuating significantly.  Creatinine noted to be 4.4 on 04/05/2023.  Had improved to 1.83 on 5/5.  Came in on 5/9 with a creatinine of 3.1.  Creatinine gradually improving.   Renal ultrasound did not show any hydronephrosis. As per nephrology patient is not a candidate for hemodialysis.  They recommend palliative  care/hospice if patient does not improve.  Coronary artery disease status post PCI in January 2024/history of torsades VT arrest Stable.  Continue Plavix. Continue amiodarone.  Essential hypertension/hyperlipidemia Stable.  Noted to be on rosuvastatin.  Paroxysmal atrial fibrillation On amiodarone and apixaban.  Chronic pain On tramadol  Constipation Continue bowel regimen.  DVT Prophylaxis: On apixaban Code Status: DNR Family Communication: Discussed with patient and her husband Disposition Plan: Hopefully return home when improved.  PT and OT evaluation.  Home health is recommended.  Status is: Inpatient Remains inpatient appropriate because: Acute CHF, acute kidney injury      Medications: Scheduled:  amiodarone  200 mg Oral Daily   apixaban  2.5 mg Oral BID   clopidogrel  75 mg Oral Daily   furosemide  80 mg Intravenous Q12H   insulin aspart  0-9 Units Subcutaneous TID WC   polyethylene glycol  17 g Oral Daily   rosuvastatin  10 mg Oral Daily   traMADol  50 mg Oral BID   Continuous:  milrinone 0.25 mcg/kg/min (04/23/23 0500)   YQM:[id number] **OR** acetaminophen, bisacodyl  Antibiotics: Anti-infectives (From admission, onward)    None       Objective:  Vital Signs  Vitals:   04/22/23 2202 04/23/23 0138 04/23/23 0453 04/23/23 0722  BP: 100/70 103/65 108/66 110/67  Pulse: 71 70 70 71  Resp: 18 20  16   Temp: 98.2 F (36.8 C)   97.7 F (36.5 C)  TempSrc: Oral   Oral  SpO2: 95% 99% 96% 97%  Weight:   77.2 kg  Intake/Output Summary (Last 24 hours) at 04/23/2023 1032 Last data filed at 04/22/2023 2230 Gross per 24 hour  Intake 97.72 ml  Output 300 ml  Net -202.28 ml    Filed Weights   04/21/23 1626 04/23/23 0453  Weight: 73.3 kg 77.2 kg    General appearance: Awake alert.  In no distress Resp: normal effort at rest.  Few crackles at the bases bilaterally.  No wheezing or rhonchi. Cardio: S1-S2 is normal regular.  No S3-S4.  No rubs  murmurs or bruit GI: Abdomen is soft.  Nontender nondistended.  Bowel sounds are present normal.  No masses organomegaly Extremities: Edema bilateral lower extremities Neurologic: Alert and oriented x3.  No focal neurological deficits.    Lab Results:  Data Reviewed: I have personally reviewed following labs and reports of the imaging studies  CBC: Recent Labs  Lab 04/20/23 1325 04/20/23 1758  WBC 4.6 4.9  NEUTROABS 3.1  --   HGB 12.5 12.8  HCT 39.3 40.8  MCV 91.4 93.4  PLT 271.0 268     Basic Metabolic Panel: Recent Labs  Lab 04/20/23 1325 04/20/23 1758 04/21/23 0515 04/22/23 0207 04/23/23 0228  NA 142 140 139 141 142  K 4.5 3.7 3.6 3.5 4.7  CL 95* 97* 96* 97* 97*  CO2 33* 29 29 31  35*  GLUCOSE 155* 153* 118* 138* 113*  BUN 55* 52* 52* 53* 50*  CREATININE 3.10* 3.24* 3.02* 2.84* 2.66*  CALCIUM 10.1 9.6 9.5 9.5 9.4  MG  --   --   --  2.2 2.1     GFR: Estimated Creatinine Clearance: 15.7 mL/min (A) (by C-G formula based on SCr of 2.66 mg/dL (H)).  Liver Function Tests: Recent Labs  Lab 04/20/23 1325 04/20/23 1758  AST 28 30  ALT 84* 86*  ALKPHOS 158* 158*  BILITOT 1.0 1.0  PROT 7.0 7.0  ALBUMIN 3.9 3.6     CBG: Recent Labs  Lab 04/22/23 0811 04/22/23 1140 04/22/23 1647 04/22/23 2206 04/23/23 0742  GLUCAP 122* 204* 73 143* 125*      Radiology Studies: No results found.     LOS: 2 days   Chibuike Fleek Rito Ehrlich  [facility] Pager on [email or web address]  04/23/2023, 10:32 AM

## 2023-04-23 NOTE — Progress Notes (Signed)
Rounding Note    Patient Name: Natasha Woodard Date of Encounter: 04/23/2023  [facility] Cardiologist: Bryan Lemma, MD   Subjective   Patient reports she feels the same as yesterday. Breathing OK. Ankle edema has improved. She has been urinating a lot on lasix. Husband at bedside is upset that she has been stuck so many times to draw blood.   Dr. Shari Prows has discussed getting pallitaive care involve but the patient / family are not ready   Has been on Milrinone  Echo from April 06, 2023 shows LVEF of < 20%. Global hypokinesis, grade III DD  RV function is moderately reduced,  PA pressures are severely elevated with estimated PA pressure of 72.  Mod - severe MR  Mild - mod TR    Inpatient Medications    Scheduled Meds:  amiodarone  200 mg Oral Daily   apixaban  2.5 mg Oral BID   clopidogrel  75 mg Oral Daily   furosemide  80 mg Intravenous Q12H   insulin aspart  0-9 Units Subcutaneous TID WC   polyethylene glycol  17 g Oral Daily   rosuvastatin  10 mg Oral Daily   traMADol  50 mg Oral BID   Continuous Infusions:  milrinone 0.25 mcg/kg/min (04/23/23 0500)   PRN Meds: acetaminophen **OR** acetaminophen, bisacodyl   Vital Signs    Vitals:   04/23/23 0138 04/23/23 0453 04/23/23 0722 04/23/23 1146  BP: 103/65 108/66 110/67   Pulse: 70 70 71 70  Resp: 20  16 16   Temp:   97.7 F (36.5 C) 97.7 F (36.5 C)  TempSrc:   Oral Oral  SpO2: 99% 96% 97% 98%  Weight:  77.2 kg      Intake/Output Summary (Last 24 hours) at 04/23/2023 1208 Last data filed at 04/22/2023 2230 Gross per 24 hour  Intake 97.72 ml  Output 300 ml  Net -202.28 ml       04/23/2023    4:53 AM 04/21/2023    4:26 PM 04/20/2023   12:30 PM  Last 3 Weights  Weight (lbs) 170 lb 3.1 oz 161 lb 11.2 oz 160 lb  Weight (kg) 77.2 kg 73.347 kg 72.576 kg      Telemetry    Paced rhythm - Personally Reviewed  ECG    No new tracings since 5/9 - Personally Reviewed  Physical Exam    Physical Exam: Blood pressure 110/67, pulse 70, temperature 97.7 F (36.5 C), temperature source Oral, resp. rate 16, weight 77.2 kg, SpO2 98 %.       GEN:  Well nourished, well developed in no acute distress HEENT: Normal NECK: No JVD; No carotid bruits LYMPHATICS: No lymphadenopathy CARDIAC: RRR soft systolic murmur  RESPIRATORY:  Clear to auscultation without rales, wheezing or rhonchi  ABDOMEN: Soft, non-tender, non-distended MUSCULOSKELETAL:  No edema; No deformity  SKIN: Warm and dry NEUROLOGIC:  Alert and oriented x 3   Labs    High Sensitivity Troponin:   Recent Labs  Lab 03/31/23 0706 03/31/23 0930 04/05/23 1820 04/05/23 2050  TROPONINIHS 45* 40* 75* 76*      Chemistry Recent Labs  Lab 04/20/23 1325 04/20/23 1325 04/20/23 1758 04/21/23 0515 04/22/23 0207 04/23/23 0228  NA 142  --  140 139 141 142  K 4.5  --  3.7 3.6 3.5 4.7  CL 95*  --  97* 96* 97* 97*  CO2 33*  --  29 29 31  35*  GLUCOSE 155*  --  153* 118* 138*  113*  BUN 55*  --  52* 52* 53* 50*  CREATININE 3.10*  --  3.24* 3.02* 2.84* 2.66*  CALCIUM 10.1  --  9.6 9.5 9.5 9.4  MG  --   --   --   --  2.2 2.1  PROT 7.0  --  7.0  --   --   --   ALBUMIN 3.9  --  3.6  --   --   --   AST 28  --  30  --   --   --   ALT 84*  --  86*  --   --   --   ALKPHOS 158*  --  158*  --   --   --   BILITOT 1.0  --  1.0  --   --   --   GFRNONAA  --    < > 14* 15* 16* 17*  ANIONGAP  --    < > 14 14 13 10    < > = values in this interval not displayed.     Lipids No results for input(s): "CHOL", "TRIG", "HDL", "LABVLDL", "LDLCALC", "CHOLHDL" in the last 168 hours.  Hematology Recent Labs  Lab 04/20/23 1325 04/20/23 1758  WBC 4.6 4.9  RBC 4.29 4.37  HGB 12.5 12.8  HCT 39.3 40.8  MCV 91.4 93.4  MCH  --  29.3  MCHC 31.7 31.4  RDW 16.8* 16.0*  PLT 271.0 268    Thyroid No results for input(s): "TSH", "FREET4" in the last 168 hours.  BNP Recent Labs  Lab 04/20/23 1758  BNP 2,204.9*     DDimer No  results for input(s): "DDIMER" in the last 168 hours.   Radiology    No results found.  Cardiac Studies   Echocardiogram 04/06/23   1. Left ventricular ejection fraction, by estimation, is <20%. The left  ventricle has severely decreased function. The left ventricle demonstrate  severe global hypokinesis and regional wall motion abnormalities (see  scoring diagram/findings for  description). The left ventricular internal cavity size was mildly  dilated. Left ventricular diastolic parameters are consistent with Grade  III diastolic dysfunction (restrictive). Elevated left ventricular  end-diastolic pressure. There is akinesis of  the left ventricular, mid-apical lateral wall and inferolateral wall.  There is akinesis of the left ventricular, apical segment.   2. Right ventricular systolic function is moderately reduced. The right  ventricular size is normal. There is severely elevated pulmonary artery  systolic pressure. The estimated right ventricular systolic pressure is  72.5 mmHg.   3. Left atrial size was severely dilated.   4. Right atrial size was mildly dilated.   5. Based on eccentricity of MR and akinesis of the inferolateral and  lateral walls, suspect this is ischemic MR due to restricted posterior  mitral valve leaflet.. The mitral valve is abnormal. Moderate to severe  mitral valve regurgitation. No evidence  of mitral stenosis.   6. The aortic valve is tricuspid. Aortic valve regurgitation is trivial.  Aortic valve sclerosis/calcification is present, without any evidence of  aortic stenosis. Aortic valve area, by VTI measures 1.81 cm. Aortic valve  mean gradient measures 3.0 mmHg.   Aortic valve Vmax measures 1.15 m/s.   7. The inferior vena cava is dilated in size with <50% respiratory  variability, suggesting right atrial pressure of 15 mmHg.   Patient Profile     82 y.o. female with a hx of STEMI in Jan 2024 with PCI, cardiogenic shock, hx freq PVCs thought  to  have added to prior NSTEMI before 2024, CAD, HTN, HLD, DM-2, PAD, CKD-3, OSA, PAF, and ICD placed 01/2023  who is being seen for the evaluation of CHF Assessment & Plan    Acute on Chronic Combined Systolic and Diastolic Heart Failure  RV dysfunction  Severe pulmonary artery HTN  Moderate-severe Mitral Valve Regurgitation  - Most recent echocardiogram from 04/06/23 showed EF <20% with severe global hypokinesis, regional wall motion abnormalities. There was also grade III diastolic dysfunction, moderately reduced RV systolic function, severely elevated pulmonary artery systolic pressure, moderate-severe mitral valve regurgitation - Patient has had several recent admissions, most recently from 4/24-5/5 with sepsis complicated by AKI and CHF. When seen as an outpatient by her PCP, her creatinine had bumped from 1.8-3 and she was readmitted  -  - In the past, patient has had multiple conversations with the advanced heart failure team regarding palliative measures. Patient and family have declined    Dr. Shari Prows had multiple conversations with patient and family regarding goals of care. She expressed her concerns about patient's poor prognosis given severe systolic dysfunction, RV dysfunction, severe pulmonary HTN, severe MR, and frequent re-hospitalizations. Despite taking her medications as prescribed, patient is in a l ow output state. Patient seemed open to idea of palliative care, but husband wished to trial milrinone   Pt is stable and her creatinine is improving slowly .   AKI  - As above, likely due to low-output heart failure  - Nephrology consulted   CAD  - Patient chest pain free today  - Continue plavix. Not on ASA due to eliquis use  - Continue crestor 10 mg daily  - No BB given concern for low-output heart failure   PAF - Telemetry shows sinus rhythm with atrial pacing and v sensing  - Continue amiodarone 200 mg daily  - Continue eliquis 2.5 mg BID (dose reduced for age, renal  function)   For questions or updates, please contact [facility] Please consult [email or web address] for contact info under      Kristeen Miss, MD  04/23/2023 12:19 PM    [facility] [address] [place],  [address] [place], [place]  [address] Phone: [phone]; Fax: [phone]

## 2023-04-23 NOTE — Progress Notes (Signed)
Mobility Specialist Progress Note    04/23/23 1332  Mobility  Activity Refused mobility   "It's Mother's Day."  [place] Mobility Specialist  Please Neurosurgeon or Rehab Office at [phone]

## 2023-04-24 ENCOUNTER — Ambulatory Visit: Payer: Self-pay

## 2023-04-24 ENCOUNTER — Encounter (HOSPITAL_BASED_OUTPATIENT_CLINIC_OR_DEPARTMENT_OTHER): Payer: Medicare Other | Admitting: Pulmonary Disease

## 2023-04-24 DIAGNOSIS — Z95810 Presence of automatic (implantable) cardiac defibrillator: Secondary | ICD-10-CM | POA: Diagnosis not present

## 2023-04-24 DIAGNOSIS — I4729 Other ventricular tachycardia: Secondary | ICD-10-CM

## 2023-04-24 DIAGNOSIS — N179 Acute kidney failure, unspecified: Secondary | ICD-10-CM | POA: Diagnosis not present

## 2023-04-24 DIAGNOSIS — I2510 Atherosclerotic heart disease of native coronary artery without angina pectoris: Secondary | ICD-10-CM

## 2023-04-24 DIAGNOSIS — I5021 Acute systolic (congestive) heart failure: Secondary | ICD-10-CM | POA: Diagnosis not present

## 2023-04-24 DIAGNOSIS — I480 Paroxysmal atrial fibrillation: Secondary | ICD-10-CM | POA: Diagnosis not present

## 2023-04-24 DIAGNOSIS — I5043 Acute on chronic combined systolic (congestive) and diastolic (congestive) heart failure: Secondary | ICD-10-CM | POA: Diagnosis not present

## 2023-04-24 LAB — BASIC METABOLIC PANEL
Anion gap: 8 (ref 5–15)
BUN: 43 mg/dL — ABNORMAL HIGH (ref 8–23)
CO2: 28 mmol/L (ref 22–32)
Calcium: 8.9 mg/dL (ref 8.9–10.3)
Chloride: 101 mmol/L (ref 98–111)
Creatinine, Ser: 2.36 mg/dL — ABNORMAL HIGH (ref 0.44–1.00)
GFR, Estimated: 20 mL/min — ABNORMAL LOW (ref >60)
Glucose, Bld: 126 mg/dL — ABNORMAL HIGH (ref 70–99)
Potassium: 4.9 mmol/L (ref 3.5–5.1)
Sodium: 137 mmol/L (ref 135–145)

## 2023-04-24 LAB — GLUCOSE, CAPILLARY
Glucose-Capillary: 125 mg/dL — ABNORMAL HIGH (ref 70–99)
Glucose-Capillary: 196 mg/dL — ABNORMAL HIGH (ref 70–99)
Glucose-Capillary: 101 mg/dL — ABNORMAL HIGH (ref 70–99)
Glucose-Capillary: 157 mg/dL — ABNORMAL HIGH (ref 70–99)

## 2023-04-24 LAB — MAGNESIUM: Magnesium: 2 mg/dL (ref 1.7–2.4)

## 2023-04-24 MED ORDER — SENNOSIDES-DOCUSATE SODIUM 8.6-50 MG PO TABS
2.0000 | ORAL_TABLET | Freq: Two times a day (BID) | ORAL | Status: DC
  Administered 2023-04-24 – 2023-05-01 (×5): 2 via ORAL
  Filled 2023-04-24 (×11): qty 2

## 2023-04-24 MED ORDER — POLYETHYLENE GLYCOL 3350 17 G PO PACK
17.0000 g | PACK | Freq: Two times a day (BID) | ORAL | Status: DC
  Administered 2023-04-24 – 2023-05-01 (×6): 17 g via ORAL
  Filled 2023-04-24 (×9): qty 1

## 2023-04-24 MED ORDER — FUROSEMIDE 10 MG/ML IJ SOLN
80.0000 mg | Freq: Two times a day (BID) | INTRAMUSCULAR | Status: DC
  Administered 2023-04-24 – 2023-04-26 (×4): 80 mg via INTRAVENOUS
  Filled 2023-04-24 (×4): qty 8

## 2023-04-24 NOTE — TOC Initial Note (Addendum)
Transition of Care [facility]) - Initial/Assessment Note    Patient Details  Name: Natasha Woodard MRN: [id number] Date of Birth: 1941-07-31  Transition of Care [facility]) CM/SW Contact:    Elliot Cousin, RN Phone Number: [phone] 04/24/2023, 1:52 PM  Clinical Narrative:  Patient readmission. She is active with [facility] Referral sent to [employer] rep, Pam for possible Home Milrinone. Will continue to follow up with dc needs.   Spoke to pt and husband at bedside. They are waiting for meeting with HF team.                 Expected Discharge Plan: Home w Hospice Care Barriers to Discharge: Continued Medical Work up   Patient Goals and CMS Choice            Expected Discharge Plan and Services   Discharge Planning Services: CM Consult                                          Prior Living Arrangements/Services   Lives with:: Spouse              Current home services:  (CPAP, wheelchair, cane)    Activities of Daily Living      Permission Sought/Granted Permission sought to share information with : Case Manager, Family Supports                Emotional Assessment              Admission diagnosis:  AKI (acute kidney injury) (HCC) [N17.9] Patient Active Problem List   Diagnosis Date Noted   ICD (implantable cardioverter-defibrillator) in place 04/24/2023   Polymorphic ventricular tachycardia (HCC) 04/24/2023   Coronary artery disease involving native coronary artery of native heart without angina pectoris 04/24/2023   Acute on chronic combined systolic and diastolic CHF (congestive heart failure) (HCC) 04/23/2023   HFrEF (heart failure with reduced ejection fraction) (HCC) 04/22/2023   Pulmonary HTN (HCC) 04/22/2023   Goals of care, counseling/discussion 04/21/2023   Sepsis secondary to UTI (HCC) 04/12/2023   Transaminitis 04/11/2023   Coronary artery disease due to lipid rich plaque 04/10/2023   Pure hypercholesterolemia  04/10/2023   Metabolic acidosis 04/07/2023   Acute exacerbation of CHF (congestive heart failure) (HCC) 04/05/2023   Acute renal failure (HCC) 03/31/2023   Nausea and vomiting 03/27/2023   Bradycardia 03/27/2023   Debility 02/19/2023   Cardiogenic shock (HCC) 02/14/2023   Chronic HFrEF (heart failure with reduced ejection fraction) (HCC) 02/14/2023   PAF (paroxysmal atrial fibrillation) (HCC) 02/14/2023   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/14/2023   Chronic kidney disease (CKD), stage III (moderate) (HCC) 02/14/2023   Malnutrition of moderate degree 01/28/2023   NSTEMI (non-ST elevated myocardial infarction) (HCC) 01/10/2023   ACS (acute coronary syndrome) (HCC) 01/10/2023   Ischemic cardiomyopathy 01/10/2023   Nonrheumatic mitral valve regurgitation 01/10/2023   Vulvar cysts 12/02/2022   Vaginal candida 12/02/2022   AKI (acute kidney injury) (HCC) 11/13/2022   ILD (interstitial lung disease) (HCC) 10/11/2022   Abnormal CT scan, kidney 10/11/2022   Bradycardia on ECG 08/31/2022   Superficial fungal infection of skin 03/20/2022   Onychomycosis of multiple toenails with type 2 diabetes mellitus and peripheral angiopathy (HCC) 11/16/2021   Pain due to onychomycosis of toenail of left foot 11/16/2021   Intermittent claudication of both lower extremities due to atherosclerosis (  HCC) 08/22/2021   Ganglion cyst of wrist 08/22/2021   Rash 02/17/2021   On amiodarone therapy 08/21/2020   CKD (chronic kidney disease) stage 3, GFR 30-59 ml/min (HCC) 09/25/2019   Gastroesophageal reflux disease 06/19/2019   Health care maintenance 11/14/2018   Joint pain 08/07/2018   Cough 06/17/2018   Sensorineural hearing loss (SNHL), bilateral 03/20/2018   Complex sleep apnea syndrome 10/05/2017   Chronic heart failure with preserved ejection fraction (HFpEF) (HCC) 09/08/2017   Dilated cardiomyopathy (HCC) 06/30/2017   Frequent PVCs 06/30/2017   Bilateral  impacted cerumen 07/15/2016   Advance care planning 08/19/2014   Medicare annual wellness visit, subsequent 07/30/2013   Type II diabetes mellitus with peripheral artery disease (HCC) 05/19/2010   Hyperlipidemia associated with type 2 diabetes mellitus (HCC) 05/19/2010   Essential hypertension 05/19/2010   Ichthyosis 05/19/2010   PCP:  [name], MD Pharmacy:   [facility] [phone] [place] Otto, [place] - [address] AT [address] [place] [address] Phone: [phone] Fax: [phone]  [facility] [place], [place] - [address] [place] [address] Phone: [phone] Fax: [phone]  [facility] [address] [place] [address] Phone: [phone] Fax: [phone]     Social Determinants of Health (SDOH) Social History: SDOH Screenings   Food Insecurity: No Food Insecurity (04/17/2023)  Housing: Low Risk  (04/17/2023)  Transportation Needs: No Transportation Needs (04/17/2023)  Utilities: Not At Risk (04/06/2023)  Depression (PHQ2-9): Medium Risk (04/20/2023)  Financial Resource Strain: Low Risk  (03/27/2023)  Physical Activity: Sufficiently Active (03/27/2023)  Social Connections: Unknown (03/27/2023)  Stress: No Stress Concern Present (03/27/2023)  Tobacco Use: Medium Risk (04/21/2023)   SDOH Interventions:     Readmission Risk Interventions    04/03/2023   10:10 AM  Readmission Risk Prevention Plan  Transportation Screening Complete  PCP or Specialist Appt within 5-7 Days Complete  Home Care Screening Complete  Medication Review (RN CM) Referral to Pharmacy

## 2023-04-24 NOTE — Progress Notes (Signed)
[facility] PROGRESS NOTE   Natasha Woodard YQM:[id number] DOB: [birth year] DOA: 04/20/2023  PCP: Natasha Nam, MD  Brief History/Interval Summary: 79 yof w/ complex medical history including chronic HFrEF (EF less than 20%), moderate to severe MR, CAD status post PCI 12/2022, hypertension, hyperlipidemia, paroxysmal A-fib on Eliquis, history of VT arrest, status post ICD, CKD stage IIIb, pseudoaneurysm of right groin. Recently admitted 4/24-5/5 for decompensated CHF, AKI, and septic shock secondary to UTI who was discharged on torsemide 40 mg daily seen by PCP 5/9 and sent to the ED due to concern for AKI.  She was noted to be volume overloaded.  Hospitalize for further management.  Consultants: Cardiology.  Nephrology  Procedures: None    Subjective/Interval History: Patient mentioned that she is feeling better.  Shortness of breath is improved.  No chest pain.  Son is at the bedside.     Assessment/Plan:  Acute combined systolic and diastolic CHF EF is less than 20%.  Patient also with moderate to severe mitral valve regurgitation. Cardiology is following.  Patient started on milrinone infusion.   Strict ins and outs and daily weights. Patient also on furosemide 80 mg twice a day.   Cannot give any ACE inhibitor or ARB due to her acute kidney injury. Patient remains stable.  Acute kidney injury on chronic kidney disease stage IIIb Nephrology was consulted.  Torsemide placed on hold.  Patient started on milrinone and furosemide as discussed above. Looks like up until March of this year her baseline creatinine was around 1.6.  Since then it has been fluctuating significantly.  Creatinine noted to be 4.4 on 04/05/2023.  Had improved to 1.83 on 5/5.  Came in on 5/9 with a creatinine of 3.1.  Creatinine is generally improving.  Monitor urine output. Renal ultrasound did not show any hydronephrosis. As per nephrology patient is not a candidate for hemodialysis.  They  recommend palliative care/hospice if patient does not improve.  Coronary artery disease status post PCI in January 2024/history of torsades VT arrest Stable.  Continue Plavix. Continue amiodarone.  Essential hypertension/hyperlipidemia Stable.  Noted to be on rosuvastatin.  Paroxysmal atrial fibrillation On amiodarone and apixaban.  Chronic pain On tramadol  Constipation Continue bowel regimen.  DVT Prophylaxis: On apixaban Code Status: DNR Family Communication: Discussed with patient and her son Disposition Plan: Hopefully return home when improved.  PT and OT evaluation.  Home health is recommended.  Status is: Inpatient Remains inpatient appropriate because: Acute CHF, acute kidney injury      Medications: Scheduled:  amiodarone  200 mg Oral Daily   apixaban  2.5 mg Oral BID   clopidogrel  75 mg Oral Daily   furosemide  80 mg Intravenous Q12H   insulin aspart  0-9 Units Subcutaneous TID WC   polyethylene glycol  17 g Oral BID   rosuvastatin  10 mg Oral Daily   senna-docusate  2 tablet Oral BID   traMADol  50 mg Oral BID   Continuous:  milrinone 0.25 mcg/kg/min (04/23/23 2329)   [id number] **OR** acetaminophen, bisacodyl  Antibiotics: Anti-infectives (From admission, onward)    None       Objective:  Vital Signs  Vitals:   04/23/23 2043 04/23/23 2252 04/24/23 0548 04/24/23 0805  BP: (!) 125/54 102/67 109/65 108/68  Pulse:  70 73 88  Resp: 16   16  Temp: 98.2 F (36.8 C) (!) 97.4 F (36.3 C)  97.8 F (36.6 C)  TempSrc: Oral Axillary  Oral  SpO2:  96% 94% 94%  Weight:   76.9 kg     Intake/Output Summary (Last 24 hours) at 04/24/2023 0944 Last data filed at 04/23/2023 2140 Gross per 24 hour  Intake 240 ml  Output --  Net 240 ml    Filed Weights   04/21/23 1626 04/23/23 0453 04/24/23 0548  Weight: 73.3 kg 77.2 kg 76.9 kg    General appearance: Awake alert.  In no distress Resp: Normal effort at rest.  Few crackles at the bases.   No wheezing or rhonchi. Cardio: S1-S2 is normal regular.  No S3-S4.  No rubs murmurs or bruit GI: Abdomen is soft.  Nontender nondistended.  Bowel sounds are present normal.  No masses organomegaly Extremities: 1+ pitting edema bilateral lower extremities Neurologic: Alert and oriented x3.  No focal neurological deficits.     Lab Results:  Data Reviewed: I have personally reviewed following labs and reports of the imaging studies  CBC: Recent Labs  Lab 04/20/23 1325 04/20/23 1758  WBC 4.6 4.9  NEUTROABS 3.1  --   HGB 12.5 12.8  HCT 39.3 40.8  MCV 91.4 93.4  PLT 271.0 268     Basic Metabolic Panel: Recent Labs  Lab 04/20/23 1758 04/21/23 0515 04/22/23 0207 04/23/23 0228 04/24/23 0259  NA 140 139 141 142 137  K 3.7 3.6 3.5 4.7 4.9  CL 97* 96* 97* 97* 101  CO2 29 29 31  35* 28  GLUCOSE 153* 118* 138* 113* 126*  BUN 52* 52* 53* 50* 43*  CREATININE 3.24* 3.02* 2.84* 2.66* 2.36*  CALCIUM 9.6 9.5 9.5 9.4 8.9  MG  --   --  2.2 2.1 2.0     GFR: Estimated Creatinine Clearance: 17.6 mL/min (A) (by C-G formula based on SCr of 2.36 mg/dL (H)).  Liver Function Tests: Recent Labs  Lab 04/20/23 1325 04/20/23 1758  AST 28 30  ALT 84* 86*  ALKPHOS 158* 158*  BILITOT 1.0 1.0  PROT 7.0 7.0  ALBUMIN 3.9 3.6     CBG: Recent Labs  Lab 04/23/23 0742 04/23/23 1144 04/23/23 1621 04/23/23 2259 04/24/23 0758  GLUCAP 125* 247* 119* 175* 125*      Radiology Studies: No results found.     LOS: 3 days   Natasha Woodard [employer] on [email or web address]  04/24/2023, 9:44 AM

## 2023-04-24 NOTE — Consult Note (Addendum)
Advanced Heart Failure Team Consult Note   Primary Physician: [name], MD PCP-Cardiologist:  Bryan Lemma, MD HF MD: [name]   Reason for Consultation: Heart Failure   HPI:    Natasha Woodard is seen today for evaluation of heart failure  at the request of Dr Alanson Puls.   Natasha Woodard is a 82 y.o. female with a hx of STEMI in Jan 2024 with PCI, cardiogenic shock, hx freq PVCs thought to have added to prior NSTEMI before 2024, CAD, HTN, HLD, DM-2, PAD, CKD-3, OSA, PAF, VT, and ICD placed 01/2023.   Admitted January of this year with acute MI c/b ischemic MR and acute HFrEF w/ low output, CI 2.1 on RHC. Required placement of IABP, but later removed given concerns for developing limb ischemia=>found to have Right femoral PSA then ultimate rupture s/p emergent repair. Also had muscle flap to defect. VT arrest 2/8. ICD placed 2/12. Discharged to CIR and later to home on 02/28/2023.   Admitted 03/30/23 with AKI/ nausea/vomiting seconary to E Coli. Treated with IV fluids and torsemide was held. Discharged on 04/03/23 with plan to restart torsemide on 04/05/23.   Returned to ED on 04/05/23 with increased shortness of breath, nausea, vomiting and abdominal pain. Hospital course c/b sepsis and A/C HFrEF. Echo EF < 20% RV moderately reduced. Treated with IV antibiotics. Diuresed with IV lasix + metolazone. Creatinine at the time of discharge was 1.8. Discharged to home 04/16/23 on torsemide. .   Admitted 04/21/23  with AKI. On admit, creatinine had gone back up 3.2. Lactic acid 2.8. Cardiology consulted. Started on milrinone 0.25 mcg for presumed low output heart failure. Nephrology consulted  with no plans for HD but diurese with IV lasix. Over the weekend she continued on milrinone. Creatinine has been trending down 3.2---> 2.36.     Plan for PICC with inotrope initiation. HF Team consulted today to help with home inotropes.     Review of Systems: [y] = yes, [ ]  = no   General:  Weight gain [ ] ; Weight loss [ ] ; Anorexia [ ] ; Fatigue [ Y]; Fever [ ] ; Chills [ ] ; Weakness [Y ]  Cardiac: Chest pain/pressure [ ] ; Resting SOB [ ] ; Exertional SOB [ ] ; Orthopnea [ ] ; Pedal Edema [ Y]; Palpitations [ ] ; Syncope [ ] ; Presyncope [ ] ; Paroxysmal nocturnal dyspnea[ ]   Pulmonary: Cough [ ] ; Wheezing[ ] ; Hemoptysis[ ] ; Sputum [ ] ; Snoring [ ]   GI: Vomiting[ ] ; Dysphagia[ ] ; Melena[ ] ; Hematochezia [ ] ; Heartburn[ ] ; Abdominal pain [ ] ; Constipation [ ] ; Diarrhea [ ] ; BRBPR [ ]   GU: Hematuria[ ] ; Dysuria [ ] ; Nocturia[ ]   Vascular: Pain in legs with walking [ ] ; Pain in feet with lying flat [ ] ; Non-healing sores [ ] ; Stroke [ ] ; TIA [ ] ; Slurred speech [ ] ;  Neuro: Headaches[ ] ; Vertigo[ ] ; Seizures[ ] ; Paresthesias[ ] ;Blurred vision [ ] ; Diplopia [ ] ; Vision changes [ ]   Ortho/Skin: Arthritis [ ] ; Joint pain [ Y]; Muscle pain [ ] ; Joint swelling [ ] ; Back Pain [ Y]; Rash [ ]   Psych: Depression[ ] ; Anxiety[ ]   Heme: Bleeding problems [ ] ; Clotting disorders [ ] ; Anemia [ ]   Endocrine: Diabetes [ Y]; Thyroid dysfunction[ ]   Home Medications Prior to Admission medications   Medication Sig Start Date End Date Taking? Authorizing Provider  acetaminophen (TYLENOL) 325 MG tablet Take 1-2 tablets (325-650 mg total) by mouth every 6 (six) hours as needed. Patient taking  differently: Take 325-650 mg by mouth in the morning and at bedtime. 02/28/23  Yes Setzer, Lynnell Jude, PA-C  amiodarone (PACERONE) 200 MG tablet Take 1 tablet (200 mg total) by mouth daily. 04/03/23 07/02/23 Yes Pokhrel, Rebekah Chesterfield, MD  apixaban (ELIQUIS) 2.5 MG TABS tablet Take 1 tablet (2.5 mg total) by mouth 2 (two) times daily. 04/03/23 04/02/24 Yes Pokhrel, Laxman, MD  clopidogrel (PLAVIX) 75 MG tablet Take 1 tablet (75 mg total) by mouth daily. 04/03/23 04/02/24 Yes Pokhrel, Laxman, MD  melatonin 5 MG TABS Take 5 mg by mouth at bedtime as needed (for sleep).   Yes [provider]  Multiple Vitamin (MULTIVITAMIN WITH  MINERALS) TABS tablet Take 1 tablet by mouth daily. 03/01/23  Yes Setzer, Lynnell Jude, PA-C  ondansetron (ZOFRAN-ODT) 4 MG disintegrating tablet Take 1 tablet (4 mg total) by mouth every 8 (eight) hours as needed for nausea or vomiting. 04/05/23  Yes [name], MD  rosuvastatin (CRESTOR) 10 MG tablet Take 1 tablet (10 mg total) by mouth daily. TAKE 1 TABLET(10 MG) BY MOUTH DAILY Strength: 10 mg Patient taking differently: Take 10 mg by mouth daily. 02/28/23  Yes Setzer, Lynnell Jude, PA-C  torsemide (DEMADEX) 20 MG tablet Take 2 tablets (40 mg total) by mouth daily. 04/17/23 05/17/23 Yes Arrien, York Ram, MD  traMADol (ULTRAM) 50 MG tablet Take 1 tablet (50 mg total) by mouth 2 (two) times daily. 02/28/23  Yes Setzer, Lynnell Jude, PA-C  triamcinolone cream (KENALOG) 0.1 % Apply 1 Application topically 2 (two) times daily as needed (for itching). Patient taking differently: Apply 1 Application topically 2 (two) times daily as needed (for itching). Apply to back for itching 04/17/23  Yes [name], MD  feeding supplement, GLUCERNA SHAKE, (GLUCERNA SHAKE) LIQD Take 237 mLs by mouth 2 (two) times daily between meals. Patient not taking: Reported on 04/21/2023 04/16/23 05/16/23  Arrien, York Ram, MD  glucose blood (ACCU-CHEK AVIVA PLUS) test strip USE TO TEST BLOOD SUGAR ONCE DAILY 03/23/23   [name], MD    Past Medical History: Past Medical History:  Diagnosis Date   AICD (automatic cardioverter/defibrillator) present    Calcific tendonitis 2008   Treatment of left leg   Cardiomyopathy (HCC) 06/2017   a) Echo: EF 25-30%. GR 1-2 DD w/ elevated LVEDP. Mild valvular Dz; b) Cardiac MRI 2/'19: frequent PVCs (diffiuclt to interpret) - EF ~27% w/ diffuse HK.  No evidence of infarct, infiltrative Dz or myocarditis. -- ? if related to PVCs. c) f/u Echo 6/'19: EF 35-40%. Gr 1 DD. Diffuse HK.-> d) Jan 2020 EF 50 to 55%.  Mod MAC, mod LAE.  Ao Sclerosis; e) Echo 1/'21 - EF 60-65%, Gr II DD. Mild Mod  MR.    Chronic combined systolic and diastolic CHF, NYHA class 2 and ACA/AHA stage C    Now essentially resolved, back to simply diastolic CHF   Coronary artery disease, non-occlusive    mild-moderate CAD 06/30/17 cath   CTS (carpal tunnel syndrome)    Diabetes mellitus    Type II   Dysrhythmia    patient said that she cant remember what it is   Frequent unifocal PVCs 01/2018   Event Monitor: NSR, W/ frequent multifocal PVCs (9%-down from 30% prior to amiodarone) and PACs.  Nighttime bradycardia suggestive of OSA.';  Zio patch September 2023: PVC burden now 6.1%.   Hiatal hernia    with reflux   Hyperlipidemia    Statin intolerant   Hypertension  Ichthyosis congenita    Intermittent claudication of both lower extremities due to atherosclerosis (HCC) 08/22/2021   LEA Dopplers 09/09/2021:  Right: Total occlusion noted in the superficial femoral artery. Atherosclerosis noted throughout extremity, see note above. Three vessel runoff.  Left: Total occlusion noted in the superficial femoral artery and/or popliteal artery. Total occlusion noted in the distal anterior tibial artery. Athereosclerosis noted throughout extremity.    NSVD (normal spontaneous vaginal delivery) 1971 & 1972   Obesity    Plantar fasciitis    Left   Presence of permanent cardiac pacemaker    Pulmonary nodule    Imaged multiple times and benign appearing   Sleep apnea    Wears glasses     Past Surgical History: Past Surgical History:  Procedure Laterality Date   ABDOMINAL HYSTERECTOMY  1975   TAH-BSO   ABSCESS DRAINAGE  1972   right breast   APPLICATION OF WOUND VAC Right 01/15/2023   Procedure: APPLICATION OF INCISIONAL WOUND VAC;  Surgeon: Victorino Sparrow, MD;  Location: [facility] OR;  Service: Vascular;  Laterality: Right;   APPLICATION OF WOUND VAC Right 01/26/2023   Procedure: APPLICATION OF WOUND VAC RIGHT GROIN WASHOUT AND SARTORIUS MUSCLE FLAP;  Surgeon: Victorino Sparrow, MD;  Location: [facility] OR;  Service:  Vascular;  Laterality: Right;   ARTERY REPAIR Right 01/15/2023   Procedure: RIGHT PROFUNDA ARTERY REPAIR WITH 2000G MYRIAD MORCELLS;  Surgeon: Victorino Sparrow, MD;  [facility] OR;  Service: Vascular;  Laterality: Right;   BREAST BIOPSY Left 01/14/2009   Stereo- Benign   CARDIAC MRI  01/2018   Difficult to interpret 2/2 PVCs. Normal LV size - EF ~27% with diffuse HK. Mild RV dilation - normal fxn. -- NO MYOCARDIAL LGI => no definitive evidence of prior MI, Infiltrative Dz or Myocarditis -- suspect NICM, possibly related to PVCs.   CARPAL TUNNEL RELEASE Right 07/02/2014   Procedure: RIGHT CARPAL TUNNEL RELEASE;  Surgeon: Nicki Reaper, MD;  Location: [facility];  Service: Orthopedics;  Laterality: Right;   CARPAL TUNNEL RELEASE Left 06/11/2015   Procedure: LEFT CARPAL TUNNEL RELEASE;  Surgeon: Cindee Salt, MD;  Location: [facility];  Service: Orthopedics;  Laterality: Left;  REGIONAL/FAB   COLONOSCOPY     corn removal  1968   right   CORONARY STENT INTERVENTION N/A 01/10/2023   Procedure: CORONARY STENT INTERVENTION;  Surgeon: Lennette Bihari, MD;  Location: [facility] INVASIVE CV LAB;  Service: Cardiovascular;  Laterality: N/A;   GROIN DEBRIDEMENT Right 02/06/2023   Procedure: RIGHT GROIN DEBRIDEMENT WITH WOUND VAC CHANGE;  Surgeon: Victorino Sparrow, MD;  Location: [facility] OR;  Service: Vascular;  Laterality: Right;   HEMATOMA EVACUATION Right 01/15/2023   Procedure: EVACUATION HEMATOMA RIGHT GROIN;  Surgeon: Victorino Sparrow, MD;  Location: [facility] OR;  Service: Vascular;  Laterality: Right;   Holter Monitor  06/2017   ~17,000 PVC beats - majority were singlets, some couplets. 44 brief 3-7 beat runs of NSVT. Also noted were less frequent PACs with 11 runs (longest 15 beats)   IABP INSERTION Right 01/10/2023   Procedure: IABP Insertion;  Surgeon: Lennette Bihari, MD;  Location: [facility] INVASIVE CV LAB;  Service: Cardiovascular;  Laterality: Right;   ICD IMPLANT N/A 01/23/2023   Procedure: ICD  IMPLANT;  Surgeon: Marinus Maw, MD;  Location: [facility] INVASIVE CV LAB;  Service: Cardiovascular;  Laterality: N/A;   INCISION AND DRAINAGE OF WOUND Right 01/31/2023   Procedure: IRRIGATION AND DEBRIDEMENT RIGHT GROIN  WOUND AND WOUND VAC CHANGE;  Surgeon: Victorino Sparrow, MD;  Location: [facility] OR;  Service: Vascular;  Laterality: Right;   OPEN REDUCTION INTERNAL FIXATION (ORIF) DISTAL RADIAL FRACTURE Left 07/21/2017   Procedure: OPEN REDUCTION INTERNAL FIXATION (ORIF) LEFT DISTAL RADIAL FRACTURE;  Surgeon: Sheral Apley, MD;  Location: [facility] OR;  Service: Orthopedics;  Laterality: Left;   RIGHT/LEFT HEART CATH AND CORONARY ANGIOGRAPHY N/A 06/30/2017   Procedure: Right/Left Heart Cath and Coronary Angiography;  Surgeon: Marykay Lex, MD;  Location: [facility] INVASIVE CV LAB:  pRCA 55%, pCx 40%, OM1 45%, mCx 50%, D2 50% - LVEF 25-35%. Moderately elevated LVEDP(26 mmHg with PCWP 16 mmHg).  FICK CO/CI: 4.47/2.48. PA pressures 47/14 mmHg with a mean of 27 mm.   RIGHT/LEFT HEART CATH AND CORONARY ANGIOGRAPHY N/A 01/10/2023   Procedure: RIGHT/LEFT HEART CATH AND CORONARY ANGIOGRAPHY;  Surgeon: Lennette Bihari, MD;  Location: [facility] INVASIVE CV LAB;  Service: Cardiovascular;  Laterality: N/A;   ROTATOR CUFF REPAIR     left shoulder   TEMPORARY PACEMAKER N/A 01/19/2023   Procedure: TEMPORARY PACEMAKER;  Surgeon: Dolores Patty, MD;  Location: [facility] INVASIVE CV LAB;  Service: Cardiovascular;  Laterality: N/A;   TRANSTHORACIC ECHOCARDIOGRAM  06/2017   EF 25 and 30%. GR 1 DD. Mild diastolic dysfunction with elevated LVEDP. Mild valvular disease.   TRANSTHORACIC ECHOCARDIOGRAM  11'18, 6/'19    a) EF remains 30-35%.  Diffuse hypokinesis noted.  Severe LA dilation; b) Improved EF 35-40%.  Diffuse HK.  GR 1 DD.  Moderate TR.  Mild RV dilation.   TRANSTHORACIC ECHOCARDIOGRAM  01/28/2022   Follow-up echo: EF 50 to 55%.  No RWMA.  GRII DD.  Severe LA dilation.  Mild RA dilation with normal PAP and RAP.  Mild to moderate TR (no  significant change noted)   Zio patch  08/2022   3-day: Currently NSR (HR range 47-91 with average 64 bpm) 1 F AVB with IVCD/BBB.  Rare isolated PACs (<1%).  Frequent PVCs (6.1% with rare couplets and triplets.  Also bigeminy.  2 atrial runs 8 beats 5 2 seconds.  No sustained arrhythmias.   ZIO Patch 14 d Event Monitor  10/2018   Results as below < 1% PVC.  (Notably improved after starting amiodarone)    Family History: Family History  Problem Relation Age of Onset   Heart disease Mother        s/p pacemaker   Diabetes Mother    Heart disease Father    Diabetes Father    Heart disease Brother        MI   Cancer Brother        Lung   Hypertension Other    Colon cancer Neg Hx    Breast cancer Neg Hx     Social History: Social History   Socioeconomic History   Marital status: Married    Spouse name: Not on file   Number of children: 2   Years of education: Not on file   Highest education level: Bachelor's degree (e.g., BA, AB, BS)  Occupational History    Employer: RETIRED  Tobacco Use   Smoking status: Former    Years: 28    Types: Cigarettes    Quit date: 12/12/1993    Years since quitting: 29.3    Passive exposure: Never   Smokeless tobacco: Never  Vaping Use   Vaping Use: Never used  Substance and Sexual Activity   Alcohol use: No    Alcohol/week: 0.0 standard  drinks of alcohol   Drug use: No   Sexual activity: Never  Other Topics Concern   Not on file  Social History Narrative   Two kids, two grandchildren, two great grandchildren.    Married 1969   Retired.    Social Determinants of Health   Financial Resource Strain: Low Risk  (03/27/2023)   Overall Financial Resource Strain (CARDIA)    Difficulty of Paying Living Expenses: Not very hard  Food Insecurity: No Food Insecurity (04/17/2023)   Hunger Vital Sign    Worried About Running Out of Food in the Last Year: Never true    Ran Out of Food in the Last Year: Never true  Transportation Needs: No  Transportation Needs (04/17/2023)   PRAPARE - Administrator, Civil Service (Medical): No    Lack of Transportation (Non-Medical): No  Physical Activity: Sufficiently Active (03/27/2023)   Exercise Vital Sign    Days of Exercise per Week: 4 days    Minutes of Exercise per Session: 40 min  Stress: No Stress Concern Present (03/27/2023)   [employer] of Occupational Health - Occupational Stress Questionnaire    Feeling of Stress : Only a little  Social Connections: Unknown (03/27/2023)   Social Connection and Isolation Panel [NHANES]    Frequency of Communication with Friends and Family: More than three times a week    Frequency of Social Gatherings with Friends and Family: Twice a week    Attends Religious Services: More than 4 times per year    Active Member of [employer] or Organizations: Not on file    Attends Banker Meetings: Not on file    Marital Status: Married    Allergies:  Allergies  Allergen Reactions   Chlorhexidine Gluconate Rash    Pt refuses CHG due to inflammation and rash   Ezetimibe-Simvastatin Other (See Comments)    REACTION: Muscle aches (side effect)   Lipitor [Atorvastatin] Other (See Comments)    Leg weakness    Claritin [Loratadine]     Possible cause of nightmares.     Spironolactone     Caution re: elevated potassium/creatinine   Tamiflu [Oseltamivir Phosphate] Other (See Comments)    nightmares   Pravastatin Sodium Other (See Comments)    REACTION: Muscle aches (side effect)   Sulfonamide Derivatives Nausea And Vomiting    Objective:    Vital Signs:   Temp:  [97.4 F (36.3 C)-98.2 F (36.8 C)] 97.8 F (36.6 C) (05/13 0805) Pulse Rate:  [70-88] 88 (05/13 0805) Resp:  [16-17] 16 (05/13 0805) BP: (102-125)/(54-68) 108/68 (05/13 0805) SpO2:  [94 %-98 %] 94 % (05/13 0805) Weight:  [76.9 kg] 76.9 kg (05/13 0548) Last BM Date : 04/23/23  Weight change: Filed Weights   04/21/23 1626 04/23/23 0453 04/24/23 0548  Weight:  73.3 kg 77.2 kg 76.9 kg    Intake/Output:   Intake/Output Summary (Last 24 hours) at 04/24/2023 1141 Last data filed at 04/23/2023 2140 Gross per 24 hour  Intake 240 ml  Output --  Net 240 ml      Physical Exam    General:  . No resp difficulty HEENT: normal Neck: supple. JVP 8-9 . Carotids 2+ bilat; no bruits. No lymphadenopathy or thyromegaly appreciated. Cor: PMI nondisplaced. Regular rate & rhythm. No rubs, gallops or murmurs. Lungs: clear Abdomen: soft, nontender, nondistended. No hepatosplenomegaly. No bruits or masses. Good bowel sounds. Extremities: no cyanosis, clubbing, rash, trace lower extremity edema Neuro: alert & orientedx3, cranial nerves grossly intact.  moves all 4 extremities w/o difficulty. Affect pleasant   Telemetry   A-V paced 70 bpm with PVCs   EKG    N/A  Labs   Basic Metabolic Panel: Recent Labs  Lab 04/20/23 1758 04/21/23 0515 04/22/23 0207 04/23/23 0228 04/24/23 0259  NA 140 139 141 142 137  K 3.7 3.6 3.5 4.7 4.9  CL 97* 96* 97* 97* 101  CO2 29 29 31  35* 28  GLUCOSE 153* 118* 138* 113* 126*  BUN 52* 52* 53* 50* 43*  CREATININE 3.24* 3.02* 2.84* 2.66* 2.36*  CALCIUM 9.6 9.5 9.5 9.4 8.9  MG  --   --  2.2 2.1 2.0    Liver Function Tests: Recent Labs  Lab 04/20/23 1325 04/20/23 1758  AST 28 30  ALT 84* 86*  ALKPHOS 158* 158*  BILITOT 1.0 1.0  PROT 7.0 7.0  ALBUMIN 3.9 3.6   No results for input(s): "LIPASE", "AMYLASE" in the last 168 hours. No results for input(s): "AMMONIA" in the last 168 hours.  CBC: Recent Labs  Lab 04/20/23 1325 04/20/23 1758  WBC 4.6 4.9  NEUTROABS 3.1  --   HGB 12.5 12.8  HCT 39.3 40.8  MCV 91.4 93.4  PLT 271.0 268    Cardiac Enzymes: No results for input(s): "CKTOTAL", "CKMB", "CKMBINDEX", "TROPONINI" in the last 168 hours.  BNP: BNP (last 3 results) Recent Labs    04/05/23 1820 04/06/23 0058 04/20/23 1758  BNP 2,550.1* 2,843.8* 2,204.9*    ProBNP (last 3 results) Recent Labs     06/09/22 1506  PROBNP 4,638*     CBG: Recent Labs  Lab 04/23/23 0742 04/23/23 1144 04/23/23 1621 04/23/23 2259 04/24/23 0758  GLUCAP 125* 247* 119* 175* 125*    Coagulation Studies: No results for input(s): "LABPROT", "INR" in the last 72 hours.   Imaging   No results found.   Medications:     Current Medications:  amiodarone  200 mg Oral Daily   apixaban  2.5 mg Oral BID   clopidogrel  75 mg Oral Daily   furosemide  80 mg Intravenous Q12H   insulin aspart  0-9 Units Subcutaneous TID WC   polyethylene glycol  17 g Oral BID   rosuvastatin  10 mg Oral Daily   senna-docusate  2 tablet Oral BID   traMADol  50 mg Oral BID    Infusions:  milrinone 0.25 mcg/kg/min (04/23/23 2329)      Patient Profile  Natasha Woodard is a 82 y.o. female with a hx of STEMI in Jan 2024 with PCI, cardiogenic shock, hx freq PVCs thought to have added to prior NSTEMI before 2024, CAD, HTN, HLD, DM-2, PAD, CKD-3, OSA, PAF, and ICD placed 01/2023.   Admitted with recurrent AKI/A/C biventricular HF   Assessment/Plan  1. A/C Biventricular HF, suspect mixed ischemic/nonischemic cardiomyopathy.   Echo in 2/23 with EF 50-55%.  Patient has history of nonischemic cardiomyopathy (?PVC-mediated) that improved. In February 2024 admitted with acute MI and EF was 20-25%. During that admit she had VT arrest and had [employer] ICD placed.  Echo repeated April 2024 that showed EF had gone back down to < 20% and RV moderately reduced.  Struggling over the last month. Admitted with recurrent low output heart failure. Milrinone initiated. Renal function improving and low output symptoms resolved (n/v). Volume status looks better. Had IV lasix earlier this morning. Stop IV lasix. Consider switching to torsemide in the morning.  - Plan for PICC. Will need CO-OX and CVP.Marland Kitchen If  CVP up will resume IV lasix.   -Will need to watch on oral diuretics before d/c.  - May need milrinone at discharge for  palliative measures.  -GDMT limited by AKI.   2. AKI  -Creatinine baseline ~ 1.8  -Creatinine on admit 3.2 --> today 2.4  3. CAD H/O STEMI 12/2022 with occluded pLOM treated with DES  -On plavix, statin, eliquis.  -No chest pain.   4. PAF -In SR. On amio 200 mg daily  - On eliqis 2.5 mg twice a day.   5. H/O VT  -ICD placed earlier this year.  -Continue amio 200 mg daily   6. GOC, DNR  Will need Palliative Care for GOC given need for home milrinone. I discussed home inotropes for palliation and that there is no way to tell how long this will help. Usually less than 6 months. We discussed part of home Milrinone is to involve Palliative Care.  Plan to discuss with her husband today and see if home milrinone is an option.  I will reach out to [facility] for possible home inotropes. Update TOC CM.     Length of Stay: 3  Amy Clegg, NP  04/24/2023, 11:41 AM  Advanced Heart Failure Team Pager [phone] (M-F; 7a - 5p)  Please contact [facility] for night-coverage after hours (4p -7a ) and weekends on [email or web address]  Patient seen with NP, agree with the above note.   Patient has extensive history as documented above.  She was readmitted with suspected low output HF and has been started on milrinone.  Lactate was elevated this admission and she developed AKI.  Creatinine was over 3, now down to 2.36 on milrinone.    General: NAD Neck: JVP 10-12 cm, no thyromegaly or thyroid nodule.  Lungs: Clear to auscultation bilaterally with normal respiratory effort. CV: Nondisplaced PMI.  Heart regular S1/S2, no S3/S4, no murmur.  1+ edema to knees.  No carotid bruit.  Normal pedal pulses.  Abdomen: Soft, nontender, no hepatosplenomegaly, no distention.  Skin: Intact without lesions or rashes.  Neurologic: Alert and oriented x 3.  Psych: Normal affect. Extremities: No clubbing or cyanosis.  HEENT: Normal.   1. Acute on chronic systolic CHF/cardiogenic shock: Echo 4/24 with EF < 20%,  moderate RV dysfunction, moderate-severe infarct-related MR, IVC dilated. Lactate elevated, AKI.  Suspect low output HF.  She has been started on milrinone 0.25 empirically with creatinine trending down.  She still looks volume overloaded.  - Place PICC to follow CVP and co-ox.  - Continue milrinone 0.25.  With multiple recent admissions with low output HF, suspect she may be a candidate at this point for palliative home milrinone.  - Continue Lasix 80 mg IV bid.  - GDMT limited by AKI.  - She is not a candidate for transplant (age) or LVAD (age, elevated creatinine, frailty).  - We discussed hospice care when she goes home.  She is interested in this, but would be open to going home on palliative milrinone.  Will ask palliative care service to see her.  2. AKI on CKD stage 3: Suspect cardiorenal syndrome.  Creatinine improving with milrinone.  3. Mitral regurgitation: Infarct-related MR.  Moderate on 2/24 TEE, mod-severe on 4/24 echo.  Manage medically.  4. CAD: STEMI 1/24 with PLOM treated with DES.  - Continue Plavix, no ASA given apixaban use.  - Continue statin.  5. Atrial fibrillation: Paroxysmal.  In NSR currently.  - Continue amiodarone 200 daily.  - Continue Eliquis, dosed 2.5  bid for age > 80 and creatinine > 1.5.  6. VT: Has [employer] ICD and on amiodarone.   Marca Ancona 04/24/2023

## 2023-04-24 NOTE — Progress Notes (Signed)
Physical Therapy Treatment Patient Details Name: Natasha Woodard MRN: [id number] DOB: 01-21-1941 Today's Date: 04/24/2023   History of Present Illness 82 y.o. female presents to [facility] hospital on 04/20/2023, referred by PCP due to concern fro AKI. Pt was recently admitted 4/24-5/5 for decompensated CHF, AKI, sepsis 2/2 UTI. PMH: NSTEMI and cardiogenic shock with a complicated and prolonged admission 1/30-3/10/24,  HFrEF, A-fib, AKI on CKD IVOSA, HTN, HLD    PT Comments    Pt showing good progress with mobility and activity tolerance. Ready for dc home from PT standpoint whenever medically ready.   Recommendations for follow up therapy are one component of a multi-disciplinary discharge planning process, led by the attending physician.  Recommendations may be updated based on patient status, additional functional criteria and insurance authorization.  Follow Up Recommendations       Assistance Recommended at Discharge Intermittent Supervision/Assistance  Patient can return home with the following A little help with bathing/dressing/bathroom;Assistance with cooking/housework;Assist for transportation;Help with stairs or ramp for entrance   Equipment Recommendations  None recommended by PT    Recommendations for Other Services       Precautions / Restrictions Precautions Precautions: Fall;Other (comment) Precaution Comments: monitor SpO2 Restrictions Weight Bearing Restrictions: No     Mobility  Bed Mobility Overal bed mobility: Modified Independent                  Transfers Overall transfer level: Modified independent Equipment used: Rollator (4 wheels) Transfers: Sit to/from Stand Sit to Stand: Modified independent (Device/Increase time)                Ambulation/Gait Ambulation/Gait assistance: Supervision Gait Distance (Feet): 450 Feet Assistive device: Rollator (4 wheels) Gait Pattern/deviations: Step-through pattern Gait velocity: Decreased Gait  velocity interpretation: 1.31 - 2.62 ft/sec, indicative of limited community ambulator   General Gait Details: supervision for safety/lines. Pt with 3 brief standing rest breaks   Stairs             Wheelchair Mobility    Modified Rankin (Stroke Patients Only)       Balance Overall balance assessment: Needs assistance Sitting-balance support: No upper extremity supported, Feet supported Sitting balance-Leahy Scale: Good     Standing balance support: No upper extremity supported, During functional activity Standing balance-Leahy Scale: Fair                              Cognition Arousal/Alertness: Awake/alert Behavior During Therapy: WFL for tasks assessed/performed Overall Cognitive Status: Within Functional Limits for tasks assessed                                          Exercises      General Comments General comments (skin integrity, edema, etc.): SpO2 95% on RA at rest. Unable to get reading until ~2 minutes after amb with SpO2 93%. Dyspnea 2/4      Pertinent Vitals/Pain Pain Assessment Pain Assessment: No/denies pain    Home Living                          Prior Function            PT Goals (current goals can now be found in the care plan section) Acute Rehab PT Goals Patient Stated Goal: to go home Progress towards PT  goals: Progressing toward goals    Frequency    Min 2X/week      PT Plan Current plan remains appropriate    Co-evaluation              AM-PAC PT "6 Clicks" Mobility   Outcome Measure  Help needed turning from your back to your side while in a flat bed without using bedrails?: None Help needed moving from lying on your back to sitting on the side of a flat bed without using bedrails?: None Help needed moving to and from a bed to a chair (including a wheelchair)?: None Help needed standing up from a chair using your arms (e.g., wheelchair or bedside chair)?: None Help  needed to walk in hospital room?: A Little Help needed climbing 3-5 steps with a railing? : A Little 6 Click Score: 22    End of Session   Activity Tolerance: Patient tolerated treatment well Patient left: with call bell/phone within reach;in chair Nurse Communication: Mobility status PT Visit Diagnosis: Other abnormalities of gait and mobility (R26.89)     Time: 8295-6213 PT Time Calculation (min) (ACUTE ONLY): 23 min  Charges:  $Gait Training: 23-37 mins                     [facility] PT Acute Rehabilitation Services Office [phone]    Angelina Ok [facility] 04/24/2023, 2:19 PM

## 2023-04-24 NOTE — Care Management Important Message (Signed)
Important Message  Patient Details  Name: Natasha Woodard MRN: [id number] Date of Birth: 09-09-1941   Medicare Important Message Given:  Yes     Renie Ora 04/24/2023, 11:23 AM

## 2023-04-24 NOTE — Progress Notes (Signed)
Rounding Note    Patient Name: Natasha Woodard Date of Encounter: 04/24/2023  [facility] Cardiologist: Bryan Lemma, MD   Subjective   Seems to be improving.  Able to lie almost completely supine and speak in complete sentences.  Renal function improving.  Inpatient Medications    Scheduled Meds:  amiodarone  200 mg Oral Daily   apixaban  2.5 mg Oral BID   clopidogrel  75 mg Oral Daily   furosemide  80 mg Intravenous Q12H   insulin aspart  0-9 Units Subcutaneous TID WC   polyethylene glycol  17 g Oral BID   rosuvastatin  10 mg Oral Daily   senna-docusate  2 tablet Oral BID   traMADol  50 mg Oral BID   Continuous Infusions:  milrinone 0.25 mcg/kg/min (04/23/23 2329)   PRN Meds: acetaminophen **OR** acetaminophen, bisacodyl   Vital Signs    Vitals:   04/23/23 2043 04/23/23 2252 04/24/23 0548 04/24/23 0805  BP: (!) 125/54 102/67 109/65 108/68  Pulse:  70 73 88  Resp: 16   16  Temp: 98.2 F (36.8 C) (!) 97.4 F (36.3 C)  97.8 F (36.6 C)  TempSrc: Oral Axillary  Oral  SpO2:  96% 94% 94%  Weight:   76.9 kg     Intake/Output Summary (Last 24 hours) at 04/24/2023 1136 Last data filed at 04/23/2023 2140 Gross per 24 hour  Intake 240 ml  Output --  Net 240 ml      04/24/2023    5:48 AM 04/23/2023    4:53 AM 04/21/2023    4:26 PM  Last 3 Weights  Weight (lbs) 169 lb 8.5 oz 170 lb 3.1 oz 161 lb 11.2 oz  Weight (kg) 76.9 kg 77.2 kg 73.347 kg      Telemetry    Paced rhythm- Personally Reviewed  ECG    Atrial paced, ventricular sensed rhythm, RBBB plus LPFB- Personally Reviewed  Physical Exam  Very hard of hearing, but fully alert and oriented.  Lying almost fully supine. GEN: No acute distress.   Neck: 6 cm JVD Cardiac: RRR, normal S1, split S2, 2/6 holosystolic murmur is heard at the apex and left lower sternal border, no diastolic murmurs, rubs, or gallops.  Respiratory: Clear to auscultation bilaterally. GI: Soft, nontender,  non-distended  MS: No edema; No deformity. Neuro:  Nonfocal  Psych: Normal affect   Labs    High Sensitivity Troponin:   Recent Labs  Lab 03/31/23 0706 03/31/23 0930 04/05/23 1820 04/05/23 2050  TROPONINIHS 45* 40* 75* 76*     Chemistry Recent Labs  Lab 04/20/23 1325 04/20/23 1758 04/21/23 0515 04/22/23 0207 04/23/23 0228 04/24/23 0259  NA 142 140   < > 141 142 137  K 4.5 3.7   < > 3.5 4.7 4.9  CL 95* 97*   < > 97* 97* 101  CO2 33* 29   < > 31 35* 28  GLUCOSE 155* 153*   < > 138* 113* 126*  BUN 55* 52*   < > 53* 50* 43*  CREATININE 3.10* 3.24*   < > 2.84* 2.66* 2.36*  CALCIUM 10.1 9.6   < > 9.5 9.4 8.9  MG  --   --   --  2.2 2.1 2.0  PROT 7.0 7.0  --   --   --   --   ALBUMIN 3.9 3.6  --   --   --   --   AST 28 30  --   --   --   --  ALT 84* 86*  --   --   --   --   ALKPHOS 158* 158*  --   --   --   --   BILITOT 1.0 1.0  --   --   --   --   GFRNONAA  --  14*   < > 16* 17* 20*  ANIONGAP  --  14   < > 13 10 8    < > = values in this interval not displayed.    Lipids No results for input(s): "CHOL", "TRIG", "HDL", "LABVLDL", "LDLCALC", "CHOLHDL" in the last 168 hours.  Hematology Recent Labs  Lab 04/20/23 1325 04/20/23 1758  WBC 4.6 4.9  RBC 4.29 4.37  HGB 12.5 12.8  HCT 39.3 40.8  MCV 91.4 93.4  MCH  --  29.3  MCHC 31.7 31.4  RDW 16.8* 16.0*  PLT 271.0 268   Thyroid No results for input(s): "TSH", "FREET4" in the last 168 hours.  BNP Recent Labs  Lab 04/20/23 1758  BNP 2,204.9*    DDimer No results for input(s): "DDIMER" in the last 168 hours.   Radiology    No results found.  Cardiac Studies   Echocardiogram 04/06/2023    1. Left ventricular ejection fraction, by estimation, is <20%. The left  ventricle has severely decreased function. The left ventricle demonstrate  severe global hypokinesis and regional wall motion abnormalities (see  scoring diagram/findings for  description). The left ventricular internal cavity size was mildly   dilated. Left ventricular diastolic parameters are consistent with Grade  III diastolic dysfunction (restrictive). Elevated left ventricular  end-diastolic pressure. There is akinesis of  the left ventricular, mid-apical lateral wall and inferolateral wall.  There is akinesis of the left ventricular, apical segment.   2. Right ventricular systolic function is moderately reduced. The right  ventricular size is normal. There is severely elevated pulmonary artery  systolic pressure. The estimated right ventricular systolic pressure is  72.5 mmHg.   3. Left atrial size was severely dilated.   4. Right atrial size was mildly dilated.   5. Based on eccentricity of MR and akinesis of the inferolateral and  lateral walls, suspect this is ischemic MR due to restricted posterior  mitral valve leaflet.. The mitral valve is abnormal. Moderate to severe  mitral valve regurgitation. No evidence  of mitral stenosis.   6. The aortic valve is tricuspid. Aortic valve regurgitation is trivial.  Aortic valve sclerosis/calcification is present, without any evidence of  aortic stenosis. Aortic valve area, by VTI measures 1.81 cm. Aortic valve  mean gradient measures 3.0 mmHg.   Aortic valve Vmax measures 1.15 m/s.   7. The inferior vena cava is dilated in size with <50% respiratory  variability, suggesting right atrial pressure of 15 mmHg.   Patient Profile     82 y.o. female with severe combined nonischemic and ischemic cardiomyopathy (LVEF less than 20%), severe mitral regurgitation, severe pulmonary hypertension, ICD in place, admitted with low cardiac output heart failure requiring initiation of inotropes during this admission.  Assessment & Plan   Her diagnosis of heart failure precedes significant coronary events with an EF of 27% by MRI in 2019 without any evidence of myocardial late gadolinium enhancement.  Had improvement in LV function on medical therapy, with EF reported to be 50% by  echocardiogram in February 2023. Had marked deterioration following acute inferolateral STEMI in January 2024, leading to development of severe acute mitral regurgitation and further decrease in LV function.  Received drug-eluting  stent to the distal circumflex 01/10/2023.  Required intra-aortic balloon pump support, later complicated by pseudoaneurysm formation. Received implantable defibrillator in February 2024 for polymorphic VT and severe sinus node dysfunction. Has a history of paroxysmal atrial fibrillation, currently maintaining normal sinus rhythm on amiodarone and Eliquis (dose adjusted for renal dysfunction and age).  Unfortunately, she is not a candidate for advanced therapies such as transplantation and LVAD implantation due to age and significant pulmonary hypertension and right ventricular dysfunction. Has shown improvement with intravenous inotropes and I believe the best option at this point is palliative care with home inotrope infusion.  I described the purpose of milrinone as symptom improvement, without positive impact on longevity.  She does have a defibrillator in place that can protect from any proarrhythmic effects of the inotrope.  She will need a semipermanent IV line such as a PICC line to administer this medication in the ambulatory setting.  Ultimately however, it is likely that she has a limited life expected to see on the order of several months and I do believe that palliative care and eventually hospice care are appropriate for her. Mrs. Arroyo seems to understand this and has accepted it to a large degree, but she defers most of her medical decisions to her husband who does not seem to yet accept the severity of her condition and her poor prognosis. Will go ahead and plan for PICC line and home intravenous milrinone.  Have consulted the heart failure service to see if there is any other intervention they think would be valuable.     For questions or updates, please  contact [facility] Please consult [email or web address] for contact info under        Signed, Thurmon Fair, MD  04/24/2023, 11:36 AM

## 2023-04-24 NOTE — Patient Outreach (Signed)
  Care Coordination   04/24/2023 Name: AUSTRALIA PENNIMAN MRN: [id number] DOB: 1941-10-19   Care Coordination Outreach Attempts:  An unsuccessful telephone outreach was attempted for a scheduled appointment today. Patient in the hospital per chart review.   Follow Up Plan:  Additional outreach attempts will be made to offer the patient care coordination information and services.   Encounter Outcome:  No Answer   Care Coordination Interventions:  No, not indicated    George Ina [facility] Care Coordination [phone] direct line

## 2023-04-25 ENCOUNTER — Inpatient Hospital Stay (HOSPITAL_COMMUNITY): Payer: Medicare Other

## 2023-04-25 ENCOUNTER — Other Ambulatory Visit: Payer: Self-pay

## 2023-04-25 DIAGNOSIS — N179 Acute kidney failure, unspecified: Secondary | ICD-10-CM | POA: Diagnosis not present

## 2023-04-25 DIAGNOSIS — I5021 Acute systolic (congestive) heart failure: Secondary | ICD-10-CM | POA: Diagnosis not present

## 2023-04-25 DIAGNOSIS — Z515 Encounter for palliative care: Secondary | ICD-10-CM

## 2023-04-25 DIAGNOSIS — R0609 Other forms of dyspnea: Secondary | ICD-10-CM

## 2023-04-25 DIAGNOSIS — I480 Paroxysmal atrial fibrillation: Secondary | ICD-10-CM | POA: Diagnosis not present

## 2023-04-25 DIAGNOSIS — I5043 Acute on chronic combined systolic (congestive) and diastolic (congestive) heart failure: Secondary | ICD-10-CM | POA: Diagnosis not present

## 2023-04-25 DIAGNOSIS — Z7189 Other specified counseling: Secondary | ICD-10-CM

## 2023-04-25 LAB — CBC
HCT: 34.6 % — ABNORMAL LOW (ref 36.0–46.0)
Hemoglobin: 10.5 g/dL — ABNORMAL LOW (ref 12.0–15.0)
MCH: 27.9 pg (ref 26.0–34.0)
MCHC: 30.3 g/dL (ref 30.0–36.0)
MCV: 91.8 fL (ref 80.0–100.0)
Platelets: 245 10*3/uL (ref 150–400)
RBC: 3.77 MIL/uL — ABNORMAL LOW (ref 3.87–5.11)
RDW: 16 % — ABNORMAL HIGH (ref 11.5–15.5)
WBC: 3.1 10*3/uL — ABNORMAL LOW (ref 4.0–10.5)
nRBC: 0 % (ref 0.0–0.2)

## 2023-04-25 LAB — RENAL FUNCTION PANEL
Albumin: 3.5 g/dL (ref 3.5–5.0)
Anion gap: 14 (ref 5–15)
BUN: 37 mg/dL — ABNORMAL HIGH (ref 8–23)
CO2: 33 mmol/L — ABNORMAL HIGH (ref 22–32)
Calcium: 9.7 mg/dL (ref 8.9–10.3)
Chloride: 97 mmol/L — ABNORMAL LOW (ref 98–111)
Creatinine, Ser: 2.03 mg/dL — ABNORMAL HIGH (ref 0.44–1.00)
GFR, Estimated: 24 mL/min — ABNORMAL LOW (ref >60)
Glucose, Bld: 89 mg/dL (ref 70–99)
Phosphorus: 2.5 mg/dL (ref 2.5–4.6)
Potassium: 4.4 mmol/L (ref 3.5–5.1)
Sodium: 144 mmol/L (ref 135–145)

## 2023-04-25 LAB — BASIC METABOLIC PANEL
Anion gap: 10 (ref 5–15)
BUN: 39 mg/dL — ABNORMAL HIGH (ref 8–23)
CO2: 34 mmol/L — ABNORMAL HIGH (ref 22–32)
Calcium: 9.3 mg/dL (ref 8.9–10.3)
Chloride: 99 mmol/L (ref 98–111)
Creatinine, Ser: 2.14 mg/dL — ABNORMAL HIGH (ref 0.44–1.00)
GFR, Estimated: 23 mL/min — ABNORMAL LOW (ref >60)
Glucose, Bld: 109 mg/dL — ABNORMAL HIGH (ref 70–99)
Potassium: 3.2 mmol/L — ABNORMAL LOW (ref 3.5–5.1)
Sodium: 143 mmol/L (ref 135–145)

## 2023-04-25 LAB — GLUCOSE, CAPILLARY
Glucose-Capillary: 113 mg/dL — ABNORMAL HIGH (ref 70–99)
Glucose-Capillary: 209 mg/dL — ABNORMAL HIGH (ref 70–99)
Glucose-Capillary: 113 mg/dL — ABNORMAL HIGH (ref 70–99)
Glucose-Capillary: 94 mg/dL (ref 70–99)

## 2023-04-25 LAB — MAGNESIUM: Magnesium: 1.9 mg/dL (ref 1.7–2.4)

## 2023-04-25 MED ORDER — SODIUM CHLORIDE 0.9% FLUSH: 10.0000 mL | INTRAVENOUS | Status: DC | PRN

## 2023-04-25 MED ORDER — POTASSIUM CHLORIDE CRYS ER 20 MEQ PO TBCR
40.0000 meq | EXTENDED_RELEASE_TABLET | Freq: Once | ORAL | Status: AC
  Administered 2023-04-25: 40 meq via ORAL
  Filled 2023-04-25: qty 2

## 2023-04-25 MED ORDER — MAGNESIUM SULFATE 2 GM/50ML IV SOLN
2.0000 g | Freq: Once | INTRAVENOUS | Status: AC
  Administered 2023-04-25: 2 g via INTRAVENOUS
  Filled 2023-04-25: qty 50

## 2023-04-25 MED ORDER — SODIUM CHLORIDE 0.9% FLUSH
10.0000 mL | Freq: Two times a day (BID) | INTRAVENOUS | Status: DC
  Administered 2023-04-26 – 2023-04-30 (×8): 10 mL

## 2023-04-25 MED ORDER — SODIUM CHLORIDE 0.9% FLUSH
10.0000 mL | Freq: Two times a day (BID) | INTRAVENOUS | Status: DC
  Administered 2023-04-25 – 2023-04-30 (×4): 10 mL

## 2023-04-25 NOTE — Progress Notes (Addendum)
Advanced Heart Failure Rounding Note  PCP-Cardiologist: Bryan Lemma, MD   Subjective:    Yesterday diuresed with IV lasix and continued on milrinone 0.25 mcg   Lab Results  Component Value Date   CREATININE 2.14 (H) 04/25/2023   CREATININE 2.36 (H) 04/24/2023   CREATININE 2.66 (H) 04/23/2023    Denies SOB>    Objective:   Weight Range: 70.7 kg Body mass index is 28.51 kg/m.   Vital Signs:   Temp:  [97.5 F (36.4 C)-98.2 F (36.8 C)] 97.5 F (36.4 C) (05/14 0830) Pulse Rate:  [70-81] 70 (05/14 0830) Resp:  [16-18] 18 (05/14 0830) BP: (106-122)/(53-77) 118/76 (05/14 0830) SpO2:  [90 %-98 %] 98 % (05/14 0830) Weight:  [70.7 kg] 70.7 kg (05/14 0622) Last BM Date : 04/24/23  Weight change: Filed Weights   04/23/23 0453 04/24/23 0548 04/25/23 0622  Weight: 77.2 kg 76.9 kg 70.7 kg    Intake/Output:   Intake/Output Summary (Last 24 hours) at 04/25/2023 1107 Last data filed at 04/24/2023 2359 Gross per 24 hour  Intake 654.38 ml  Output --  Net 654.38 ml      Physical Exam    General:  In chair.  No resp difficulty HEENT: Normal Neck: Supple. JVP 9-10 . Carotids 2+ bilat; no bruits. No lymphadenopathy or thyromegaly appreciated. Cor: PMI nondisplaced. Regular rate & rhythm. No rubs, gallops or murmurs. Lungs: Clear Abdomen: Soft, nontender, nondistended. No hepatosplenomegaly. No bruits or masses. Good bowel sounds. Extremities: No cyanosis, clubbing, rash, edema Neuro: Alert & orientedx3, cranial nerves grossly intact. moves all 4 extremities w/o difficulty. Affect pleasant   Telemetry   SR 70-80s  EKG    N/A   Labs    CBC Recent Labs    04/25/23 0255  WBC 3.1*  HGB 10.5*  HCT 34.6*  MCV 91.8  PLT 245   Basic Metabolic Panel Recent Labs    40/98/11 0259 04/25/23 0255  NA 137 143  K 4.9 3.2*  CL 101 99  CO2 28 34*  GLUCOSE 126* 109*  BUN 43* 39*  CREATININE 2.36* 2.14*  CALCIUM 8.9 9.3  MG 2.0 1.9   Liver Function  Tests No results for input(s): "AST", "ALT", "ALKPHOS", "BILITOT", "PROT", "ALBUMIN" in the last 72 hours. No results for input(s): "LIPASE", "AMYLASE" in the last 72 hours. Cardiac Enzymes No results for input(s): "CKTOTAL", "CKMB", "CKMBINDEX", "TROPONINI" in the last 72 hours.  BNP: BNP (last 3 results) Recent Labs    04/05/23 1820 04/06/23 0058 04/20/23 1758  BNP 2,550.1* 2,843.8* 2,204.9*    ProBNP (last 3 results) Recent Labs    06/09/22 1506  PROBNP 4,638*     D-Dimer No results for input(s): "DDIMER" in the last 72 hours. Hemoglobin A1C No results for input(s): "HGBA1C" in the last 72 hours. Fasting Lipid Panel No results for input(s): "CHOL", "HDL", "LDLCALC", "TRIG", "CHOLHDL", "LDLDIRECT" in the last 72 hours. Thyroid Function Tests No results for input(s): "TSH", "T4TOTAL", "T3FREE", "THYROIDAB" in the last 72 hours.  Invalid input(s): "FREET3"  Other results:   Imaging    [place] EKG SITE RITE  Result Date: 04/25/2023 If Site Rite image not attached, placement could not be confirmed due to current cardiac rhythm.    Medications:     Scheduled Medications:  amiodarone  200 mg Oral Daily   apixaban  2.5 mg Oral BID   clopidogrel  75 mg Oral Daily   furosemide  80 mg Intravenous BID   insulin aspart  0-9  Units Subcutaneous TID WC   polyethylene glycol  17 g Oral BID   rosuvastatin  10 mg Oral Daily   senna-docusate  2 tablet Oral BID   traMADol  50 mg Oral BID    Infusions:  magnesium sulfate bolus IVPB     milrinone 0.25 mcg/kg/min (04/24/23 2359)    PRN Medications: acetaminophen **OR** acetaminophen, bisacodyl    Patient Profile   Natasha Woodard is a 82 y.o. female with a hx of STEMI in Jan 2024 with PCI, cardiogenic shock, hx freq PVCs thought to have added to prior NSTEMI before 2024, CAD, HTN, HLD, DM-2, PAD, CKD-3, OSA, PAF, and ICD placed 01/2023.    Admitted with recurrent AKI/A/C biventricular HF   Assessment/Plan  1. A/C  Biventricular HF, suspect mixed ischemic/nonischemic cardiomyopathy.   Echo in 2/23 with EF 50-55%.  Patient has history of nonischemic cardiomyopathy (?PVC-mediated) that improved. In February 2024 admitted with acute MI and EF was 20-25%. During that admit she had VT arrest and had [employer] ICD placed.  Echo repeated April 2024 that showed EF had gone back down to < 20% and RV moderately reduced.  Struggling over the last month. Admitted with recurrent low output heart failure. Milrinone initiated. Renal function improving and low output symptoms resolved (n/v).  - Plan for PICC.  Volume status improving. Continue IV lasix today.  -Will need to watch on oral diuretics before d/c.  - Will need home milrinone for palliative measures.  -GDMT limited by AKI.    2. AKI  -Creatinine baseline ~ 1.8  -Creatinine on admit 3.2 --> today 2.1   3. CAD H/O STEMI 12/2022 with occluded pLOM treated with DES  -On plavix, statin, eliquis.  -No chest pain.    4. PAF -In SR. On amio 200 mg daily  - On eliqis 2.5 mg twice a day.    5. H/O VT  -ICD placed earlier this year.  -Continue amio 200 mg daily    6. GOC, DNR  Will need Palliative Care for GOC given need for home milrinone. I discussed home inotropes for palliation and that there is no way to tell how long this will help. Usually less than 6 months. We discussed part of home Milrinone is to involve Palliative Care.  Jeri Modena aware. TOC CM following.  Palliative Care following and will have family meeting today. Home with HH versus Hospice.       Length of Stay: 4  Amy Clegg, NP  04/25/2023, 11:07 AM  Advanced Heart Failure Team Pager [phone] (M-F; 7a - 5p)  Please contact [facility] for night-coverage after hours (5p -7a ) and weekends on [email or web address]  Patient seen with NP, agree with the above note.   On milrinone 0.25 empirically, has been getting IV Lasix.  Refused PICC line until she spoke with palliative care  service.   Creatinine down to 2.14.   General: NAD Neck: JVP 10 cm, no thyromegaly or thyroid nodule.  Lungs: Clear to auscultation bilaterally with normal respiratory effort. CV: Nondisplaced PMI.  Heart regular S1/S2, no S3/S4, no murmur.  1+ edema to knees.  Abdomen: Soft, nontender, no hepatosplenomegaly, no distention.  Skin: Intact without lesions or rashes.  Neurologic: Alert and oriented x 3.  Psych: Normal affect. Extremities: No clubbing or cyanosis.  HEENT: Normal.   1. Acute on chronic systolic CHF/cardiogenic shock: Echo 4/24 with EF < 20%, moderate RV dysfunction, moderate-severe infarct-related MR, IVC dilated. Lactate elevated, AKI.  Suspect low output  HF.  She has been started on milrinone 0.25 empirically with creatinine trending down.  She still looks volume overloaded.  - Place PICC to follow CVP and co-ox (agreeable to get this evening).  - Continue milrinone 0.25.  With multiple recent admissions with low output HF, suspect she may be a candidate at this point for palliative home milrinone. We discussed this and she is interested.  - Continue Lasix 80 mg IV bid today, assess CVP when PICC is placed.   - GDMT limited by AKI.  - She is not a candidate for transplant (age) or LVAD (age, elevated creatinine, frailty).  - We discussed hospice care when she goes home.  She is interested in this, but would be open to going home on palliative milrinone.  She is currently speaking with palliative care service.  2. AKI on CKD stage 3: Suspect cardiorenal syndrome.  Creatinine improving with milrinone.  3. Mitral regurgitation: Infarct-related MR.  Moderate on 2/24 TEE, mod-severe on 4/24 echo.  Manage medically.  4. CAD: STEMI 1/24 with PLOM treated with DES.  - Continue Plavix, no ASA given apixaban use.  - Continue statin.  5. Atrial fibrillation: Paroxysmal.  In NSR currently.  - Continue amiodarone 200 daily.  - Continue Eliquis, dosed 2.5 bid for age > 80 and creatinine  > 1.5.  6. VT: Has [employer] ICD and on amiodarone.   Marca Ancona 04/25/2023 3:44 PM

## 2023-04-25 NOTE — Consult Note (Signed)
Palliative Medicine Inpatient Consult Note  Consulting Provider: Tonye Becket  Reason for consult:  Plan is to discharge home on milrinone  04/25/2023  HPI:  Per intake H&P -->  Natasha Woodard is a 82 y.o. female with a hx of STEMI in Jan 2024 with PCI, cardiogenic shock, hx freq PVCs thought to have added to prior NSTEMI before 2024, CAD, HTN, HLD, DM-2, PAD, CKD-3, OSA, PAF, VT, and ICD placed 01/2023.   The Palliative medicine team has been asked to get involved in the setting of advanced heart failure to have further goals of care conversations.  Clinical Assessment/Goals of Care:  *Please note that this is a verbal dictation therefore any spelling or grammatical errors are due to the "Dragon Medical One" system interpretation.  I have reviewed medical records including EPIC notes, labs and imaging, received report from bedside RN, assessed the patient who is sitting up at the side of the bed in NAD.    I met with Natasha Woodard to further discuss diagnosis prognosis, GOC, EOL wishes, disposition and options.   I introduced Palliative Medicine as specialized medical care for people living with serious illness. It focuses on providing relief from the symptoms and stress of a serious illness. The goal is to improve quality of life for both the patient and the family.  Medical History Review and Understanding:  Natasha Woodard is aware of her history of advanced heart failure, prior heart attack, coronary artery disease, hypertension, type 2 diabetes, peripheral arterial disease, chronic kidney disease, and that she had ventricular tachycardia requiring ICD placement just 3 months ago.  The patient understands the seriousness of her advanced heart failure.   Social History:  The patient has been married to her husband for 55 years. They have 2 children. 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 Her faith is very important to her, she is of the  [employer].  Functional and Nutritional State:  Natasha Woodard is fully functional at home able to participate in BADLs though she does utilize a Teaching laboratory technician. The patient was eating breakfast when we entered the room.  Advance Directives:  A detailed discussion was had today regarding advanced directives.  Natasha Woodard does not have advanced directives and if unable to make decisions for herself she rely upon her husband Natasha Woodard to be her Runner, broadcasting/film/video.  Code Status:  Concepts specific to code status, artifical feeding and hydration, continued IV antibiotics and rehospitalization was had.  The difference between a aggressive medical intervention path  and a palliative comfort care path for this patient at this time was had.   Carries an established DO NOT RESUSCITATE DO NOT INTUBATE CODE STATUS.   Discussion:  We discussed the seriousness of the patient's heart failure combined with her other comorbidities. She has a strong faith in God and believes he is calling her home.   Family meeting with the patient's husband and grandson at bedside to discuss GOC. The patient has a good understanding of her health and prognosis. The husband wanted more information about how his wife's condition became so critical. Dr. Royann Shivers joined the call and thoroughly explained her compounding cardiac conditions. He also explained that the palliative milrinone is used to manage the symptoms of the patient's heart failure such as dyspnea.  We then discussed options such as palliative outpatient care vs hospice care. She wants to get out of the hospital and back home. The patient's goal is to be  able to spend as much time at home with her family as she can. We discussed the milrinone will eventually stop working and her symptoms will return. We also discussed that the ICD would be discontinued closer to end of life. We did spend a lot of time talking about prognosis. We encouraged the patient and her husband to  talk with hospice to find out what outpatient palliative vs hospice would look like.   Discussed the importance of continued conversation with family and their  medical providers regarding overall plan of care and treatment options, ensuring decisions are within the context of the patients values and GOCs.  Decision Maker: Patient. If unable to make decisions for herself spouse.  Natasha Woodard, Natasha Woodard (Spouse) [phone] (Mobile)   SUMMARY OF RECOMMENDATIONS    Code Status/Advance Care Planning: DNAR/DNI Plan for PICC and dc home with milrinone Outpatient palliative    Symptom Management:   Additional Recommendations (Limitations, Scope, Preferences): Full Scope Treatment    Prognosis:   Discharge Planning:  Plan to go home with PICC line, outpatient palliative, HH  Review of Systems  Respiratory:  Positive for shortness of breath.    Vitals:   04/25/23 0024 04/25/23 0454  BP: 114/77 116/70  Pulse: 73 70  Resp: 16 16  Temp: 97.6 F (36.4 C) 97.6 F (36.4 C)  SpO2: 92% 91%    Intake/Output Summary (Last 24 hours) at 04/25/2023 4782 Last data filed at 04/24/2023 2359 Gross per 24 hour  Intake 654.38 ml  Output --  Net 654.38 ml   Last Weight  Most recent update: 04/25/2023  6:23 AM    Weight  70.7 kg (155 lb 14.4 oz)            Gen: Elderly African-American female in no acute distress HEENT: moist mucous membranes CV: Regular rate and rhythm PULM: Breathing well on room air ABD: soft/nontender EXT: No edema Neuro: Alert and oriented x3  PPS: 50%   This conversation/these recommendations were discussed with patient's attending cardiologist Dr. Royann Shivers and primary care team, Dr. Rito Ehrlich  Time spent: 120 minutes ______________________________________________________ [facility] Palliative Medicine Team Team Cell Phone: [phone] Please utilize secure chat with additional questions, if there is no response within 30 minutes please call the  above phone number  Palliative Medicine Team providers are available by phone from 7am to 7pm daily and can be reached through the team cell phone.  Should this patient require assistance outside of these hours, please call the patient's attending physician.

## 2023-04-25 NOTE — Progress Notes (Signed)
Peripherally Inserted Central Catheter Placement  The IV Nurse has discussed with the patient and/or persons authorized to consent for the patient, the purpose of this procedure and the potential benefits and risks involved with this procedure.  The benefits include less needle sticks, lab draws from the catheter, and the patient may be discharged home with the catheter. Risks include, but not limited to, infection, bleeding, blood clot (thrombus formation), and puncture of an artery; nerve damage and irregular heartbeat and possibility to perform a PICC exchange if needed/ordered by physician.  Alternatives to this procedure were also discussed.  Bard Power PICC patient education guide, fact sheet on infection prevention and patient information card has been provided to patient /or left at bedside.    PICC Placement Documentation  PICC Single Lumen 04/25/23 Right Brachial 37 cm 0 cm (Active)  Indication for Insertion or Continuance of Line Home intravenous therapies (PICC only) 04/25/23 1600  Exposed Catheter (cm) 0 cm 04/25/23 1600  Site Assessment Clean, Dry, Intact 04/25/23 1600  Line Status Flushed;Saline locked;Blood return noted 04/25/23 1600  Dressing Type Transparent;Securing device 04/25/23 1600  Dressing Status Antimicrobial disc in place;Clean, Dry, Intact 04/25/23 1600  Safety Lock Not Applicable 04/25/23 1600  Line Care Connections checked and tightened 04/25/23 1600  Line Adjustment (NICU/IV Team Only) No 04/25/23 1600  Dressing Intervention New dressing 04/25/23 1600  Dressing Change Due 05/02/23 04/25/23 1600       Franne Grip Renee 04/25/2023, 4:36 PM

## 2023-04-25 NOTE — Progress Notes (Signed)
[facility] PROGRESS NOTE   Natasha Woodard GNF:[id number] DOB: May 08, 1941 DOA: 04/20/2023  PCP: [name], MD  Brief History/Interval Summary: 89 yof w/ complex medical history including chronic HFrEF (EF less than 20%), moderate to severe MR, CAD status post PCI 12/2022, hypertension, hyperlipidemia, paroxysmal A-fib on Eliquis, history of VT arrest, status post ICD, CKD stage IIIb, pseudoaneurysm of right groin. Recently admitted 4/24-5/5 for decompensated CHF, AKI, and septic shock secondary to UTI who was discharged on torsemide 40 mg daily seen by PCP 5/9 and sent to the ED due to concern for AKI.  She was noted to be volume overloaded.  Hospitalize for further management.  Consultants: Cardiology.  Nephrology  Procedures: None    Subjective/Interval History: Patient denies any chest pain or shortness of breath.  She is somewhat upset about her overall condition her poor long-term prognosis.  No family at bedside today.   Assessment/Plan:  Acute combined systolic and diastolic CHF EF is less than 20%.  Patient also with moderate to severe mitral valve regurgitation. Cardiology is following.  Patient started on milrinone infusion.   Strict ins and outs and daily weights. Patient also on furosemide 80 mg twice a day.   Cannot give any ACE inhibitor or ARB due to her acute kidney injury. Seems to be doing well.  Plan is for home milrinone for palliative purposes.  Cardiology is facilitating.  PICC line to be placed today.  Acute kidney injury on chronic kidney disease stage IIIb Nephrology was consulted.  Torsemide placed on hold.  Patient started on milrinone and furosemide as discussed above. Looks like up until March of this year her baseline creatinine was around 1.6.  Since then it has been fluctuating significantly.  Creatinine noted to be 4.4 on 04/05/2023.  Had improved to 1.83 on 5/5.  Came in on 5/9 with a creatinine of 3.1.   Renal ultrasound did not show  any hydronephrosis. As per nephrology patient is not a candidate for hemodialysis.  They recommend palliative care/hospice if patient does not improve. Monitor urine output.  Renal function improving on a daily basis.  Coronary artery disease status post PCI in January 2024/history of torsades VT arrest Stable.  Continue Plavix. Continue amiodarone.  Essential hypertension/hyperlipidemia Stable.  Noted to be on rosuvastatin.  Paroxysmal atrial fibrillation On amiodarone and apixaban.  Chronic pain On tramadol  Constipation Continue bowel regimen.  DVT Prophylaxis: On apixaban Code Status: DNR Family Communication: Discussed with patient  Disposition Plan: Hopefully return home when improved.  PT and OT evaluation.  Home health is recommended.  Status is: Inpatient Remains inpatient appropriate because: Acute CHF, acute kidney injury      Medications: Scheduled:  amiodarone  200 mg Oral Daily   apixaban  2.5 mg Oral BID   clopidogrel  75 mg Oral Daily   furosemide  80 mg Intravenous BID   insulin aspart  0-9 Units Subcutaneous TID WC   polyethylene glycol  17 g Oral BID   rosuvastatin  10 mg Oral Daily   senna-docusate  2 tablet Oral BID   traMADol  50 mg Oral BID   Continuous:  milrinone 0.25 mcg/kg/min (04/24/23 2359)   QIO:[id number] **OR** acetaminophen, bisacodyl  Antibiotics: Anti-infectives (From admission, onward)    None       Objective:  Vital Signs  Vitals:   04/25/23 0024 04/25/23 0454 04/25/23 0622 04/25/23 0830  BP: 114/77 116/70  118/76  Pulse: 73 70  70  Resp: 16 16  18  Temp: 97.6 F (36.4 C) 97.6 F (36.4 C)  (!) 97.5 F (36.4 C)  TempSrc: Oral Axillary  Oral  SpO2: 92% 91%  98%  Weight:   70.7 kg     Intake/Output Summary (Last 24 hours) at 04/25/2023 1017 Last data filed at 04/24/2023 2359 Gross per 24 hour  Intake 654.38 ml  Output --  Net 654.38 ml    Filed Weights   04/23/23 0453 04/24/23 0548 04/25/23 0622   Weight: 77.2 kg 76.9 kg 70.7 kg    General appearance: Awake alert.  In no distress Resp: Few crackles at the bases.  No wheezing or rhonchi. Cardio: S1-S2 is normal regular.  No S3-S4.  No rubs murmurs or bruit GI: Abdomen is soft.  Nontender nondistended.  Bowel sounds are present normal.  No masses organomegaly Extremities: 1+ edema bilateral lower extremity.  Improvement noted over the last 48 hours. Neurologic: Alert and oriented x3.  No focal neurological deficits.     Lab Results:  Data Reviewed: I have personally reviewed following labs and reports of the imaging studies  CBC: Recent Labs  Lab 04/20/23 1325 04/20/23 1758 04/25/23 0255  WBC 4.6 4.9 3.1*  NEUTROABS 3.1  --   --   HGB 12.5 12.8 10.5*  HCT 39.3 40.8 34.6*  MCV 91.4 93.4 91.8  PLT 271.0 268 245     Basic Metabolic Panel: Recent Labs  Lab 04/21/23 0515 04/22/23 0207 04/23/23 0228 04/24/23 0259 04/25/23 0255  NA 139 141 142 137 143  K 3.6 3.5 4.7 4.9 3.2*  CL 96* 97* 97* 101 99  CO2 29 31 35* 28 34*  GLUCOSE 118* 138* 113* 126* 109*  BUN 52* 53* 50* 43* 39*  CREATININE 3.02* 2.84* 2.66* 2.36* 2.14*  CALCIUM 9.5 9.5 9.4 8.9 9.3  MG  --  2.2 2.1 2.0 1.9     GFR: Estimated Creatinine Clearance: 18.7 mL/min (A) (by C-G formula based on SCr of 2.14 mg/dL (H)).  Liver Function Tests: Recent Labs  Lab 04/20/23 1325 04/20/23 1758  AST 28 30  ALT 84* 86*  ALKPHOS 158* 158*  BILITOT 1.0 1.0  PROT 7.0 7.0  ALBUMIN 3.9 3.6     CBG: Recent Labs  Lab 04/24/23 0758 04/24/23 1211 04/24/23 1653 04/24/23 2149 04/25/23 0836  GLUCAP 125* 196* 101* 157* 113*      Radiology Studies: [place] EKG SITE RITE  Result Date: 04/25/2023 If Site Rite image not attached, placement could not be confirmed due to current cardiac rhythm.      LOS: 4 days   Jullisa Grigoryan [employer] on [email or web address]  04/25/2023, 10:17 AM

## 2023-04-25 NOTE — TOC Progression Note (Signed)
Transition of Care [facility]) - Progression Note    Patient Details  Name: Natasha Woodard MRN: [id number] Date of Birth: [birth year]  [facility] CM/SW Contact  Elliot Cousin, RN Phone Number: [phone] 04/25/2023, 5:12 PM  Clinical Narrative:  HF TOC CM received referral to arrange Home Health RN, PT with Home Milrinone. CM spoke to pt and she was active with [facility]. Contacted [facility] rep, Pam for  Home Milrinone. States she will start insurance auth. Pt at home with husband.  Will need HHRN and PT orders with [facility] with orders for IV Home Milrinone.    Expected Discharge Plan: Home w Hospice Care Barriers to Discharge: Continued Medical Work up  Expected Discharge Plan and Services   Discharge Planning Services: CM Consult                                           Social Determinants of Health (SDOH) Interventions SDOH Screenings   Food Insecurity: No Food Insecurity (04/17/2023)  Housing: Low Risk  (04/17/2023)  Transportation Needs: No Transportation Needs (04/17/2023)  Utilities: Not At Risk (04/06/2023)  Depression (PHQ2-9): Medium Risk (04/20/2023)  Financial Resource Strain: Low Risk  (03/27/2023)  Physical Activity: Sufficiently Active (03/27/2023)  Social Connections: Unknown (03/27/2023)  Stress: No Stress Concern Present (03/27/2023)  Tobacco Use: Medium Risk (04/21/2023)    Readmission Risk Interventions    04/03/2023   10:10 AM  Readmission Risk Prevention Plan  Transportation Screening Complete  PCP or Specialist Appt within 5-7 Days Complete  Home Care Screening Complete  Medication Review (RN CM) Referral to Pharmacy

## 2023-04-25 NOTE — Progress Notes (Signed)
[facility] 6E28 [facility]) Hospital Liaison Note  Received referral from [facility], Isidoro Donning regarding home with hospice referral with IV milrinone.  Spoke with patient's husband, Natasha Woodard via phone to explain hospice services, philosophy of care and team approach to treatment.  Patient confirmed understanding of what we discussed and stated that his understanding of the situation based on his conversation with Dr. Shirlee Latch was that "she didn't need hospice just yet."  He stated his preference would be to have patient discharge on milrinone with home health to follow at this time. TOC updated and requested outpatient Palliative services to follow patient at discharge.  [facility] outpatient Palliative services will follow up with patient after discharge confirmed.  Thank you for the opportunity to participate in this patient's care.  Doreatha Martin, RN, BSN [facility] Liaison [phone]

## 2023-04-25 NOTE — Consult Note (Signed)
   [facility] Inpatient Consult   04/25/2023  Natasha Woodard 02/22/1941 [id number]  [facility] [[facility]]  Accountable Care Organization [ACO] Patient: [name] Medicare  Primary Care Provider:  [name], MD with [place] [facility]   Patient is currently active with [facility] [[facility]] Care Management for chronic disease management services.  Patient has been engaged by a [facility] noted for care coordination.  Our community based plan of care has focused on disease management and community resource support.    Plan:  Continue to follow and update [facility] RN CC of any disposition needs for post hospital care.     Of note, [facility] Care Management services does not replace or interfere with any services that are needed or arranged by inpatient [facility] care management team.   For additional questions or referrals please contact:  Charlesetta Shanks, RN BSN CCM [facility]  [phone] business mobile phone Toll free office [phone]  *Concierge Line  [phone] Fax number: [phone] [email or web address]

## 2023-04-25 NOTE — Progress Notes (Signed)
Rounding Note    Patient Name: Natasha Woodard Date of Encounter: 04/25/2023  [facility] Cardiologist: Bryan Lemma, MD   Subjective   No dyspnea at rest. Continues to show evidence of improved hemodynamics on IV milrinone. Renal parameters are improving slowly. Met with palliative care team today, will have another meeting with husband this afternoon.  Inpatient Medications    Scheduled Meds:  amiodarone  200 mg Oral Daily   apixaban  2.5 mg Oral BID   clopidogrel  75 mg Oral Daily   furosemide  80 mg Intravenous BID   insulin aspart  0-9 Units Subcutaneous TID WC   polyethylene glycol  17 g Oral BID   potassium chloride  40 mEq Oral Once   rosuvastatin  10 mg Oral Daily   senna-docusate  2 tablet Oral BID   traMADol  50 mg Oral BID   Continuous Infusions:  milrinone 0.25 mcg/kg/min (04/24/23 2359)   PRN Meds: acetaminophen **OR** acetaminophen, bisacodyl   Vital Signs    Vitals:   04/25/23 0024 04/25/23 0454 04/25/23 0622 04/25/23 0830  BP: 114/77 116/70  118/76  Pulse: 73 70  70  Resp: 16 16  18   Temp: 97.6 F (36.4 C) 97.6 F (36.4 C)  (!) 97.5 F (36.4 C)  TempSrc: Oral Axillary  Oral  SpO2: 92% 91%  98%  Weight:   70.7 kg     Intake/Output Summary (Last 24 hours) at 04/25/2023 0924 Last data filed at 04/24/2023 2359 Gross per 24 hour  Intake 654.38 ml  Output --  Net 654.38 ml      04/25/2023    6:22 AM 04/24/2023    5:48 AM 04/23/2023    4:53 AM  Last 3 Weights  Weight (lbs) 155 lb 14.4 oz 169 lb 8.5 oz 170 lb 3.1 oz  Weight (kg) 70.716 kg 76.9 kg 77.2 kg      Telemetry    SR, PVCs - Personally Reviewed  ECG    No new tracing - Personally Reviewed  Physical Exam  Able to lie almost flat at 20 degrees HOB GEN: No acute distress.   Neck: 8 cm JVD Cardiac: RRR, 1-2/6 apical holosystolic murmur, no diastolic murmurs, rubs, or gallops.  Respiratory: Clear to auscultation bilaterally. GI: Soft, nontender, non-distended   MS: No edema; No deformity. Neuro:  Nonfocal  Psych: Normal affect   Labs    High Sensitivity Troponin:   Recent Labs  Lab 03/31/23 0706 03/31/23 0930 04/05/23 1820 04/05/23 2050  TROPONINIHS 45* 40* 75* 76*     Chemistry Recent Labs  Lab 04/20/23 1325 04/20/23 1758 04/21/23 0515 04/23/23 0228 04/24/23 0259 04/25/23 0255  NA 142 140   < > 142 137 143  K 4.5 3.7   < > 4.7 4.9 3.2*  CL 95* 97*   < > 97* 101 99  CO2 33* 29   < > 35* 28 34*  GLUCOSE 155* 153*   < > 113* 126* 109*  BUN 55* 52*   < > 50* 43* 39*  CREATININE 3.10* 3.24*   < > 2.66* 2.36* 2.14*  CALCIUM 10.1 9.6   < > 9.4 8.9 9.3  MG  --   --    < > 2.1 2.0 1.9  PROT 7.0 7.0  --   --   --   --   ALBUMIN 3.9 3.6  --   --   --   --   AST 28 30  --   --   --   --  ALT 84* 86*  --   --   --   --   ALKPHOS 158* 158*  --   --   --   --   BILITOT 1.0 1.0  --   --   --   --   GFRNONAA  --  14*   < > 17* 20* 23*  ANIONGAP  --  14   < > 10 8 10    < > = values in this interval not displayed.    Lipids No results for input(s): "CHOL", "TRIG", "HDL", "LABVLDL", "LDLCALC", "CHOLHDL" in the last 168 hours.  Hematology Recent Labs  Lab 04/20/23 1325 04/20/23 1758 04/25/23 0255  WBC 4.6 4.9 3.1*  RBC 4.29 4.37 3.77*  HGB 12.5 12.8 10.5*  HCT 39.3 40.8 34.6*  MCV 91.4 93.4 91.8  MCH  --  29.3 27.9  MCHC 31.7 31.4 30.3  RDW 16.8* 16.0* 16.0*  PLT 271.0 268 245   Thyroid No results for input(s): "TSH", "FREET4" in the last 168 hours.  BNP Recent Labs  Lab 04/20/23 1758  BNP 2,204.9*    DDimer No results for input(s): "DDIMER" in the last 168 hours.   Radiology    No results found.  Cardiac Studies   Echocardiogram 04/06/2023     1. Left ventricular ejection fraction, by estimation, is <20%. The left  ventricle has severely decreased function. The left ventricle demonstrate  severe global hypokinesis and regional wall motion abnormalities (see  scoring diagram/findings for  description). The  left ventricular internal cavity size was mildly  dilated. Left ventricular diastolic parameters are consistent with Grade  III diastolic dysfunction (restrictive). Elevated left ventricular  end-diastolic pressure. There is akinesis of  the left ventricular, mid-apical lateral wall and inferolateral wall.  There is akinesis of the left ventricular, apical segment.   2. Right ventricular systolic function is moderately reduced. The right  ventricular size is normal. There is severely elevated pulmonary artery  systolic pressure. The estimated right ventricular systolic pressure is  72.5 mmHg.   3. Left atrial size was severely dilated.   4. Right atrial size was mildly dilated.   5. Based on eccentricity of MR and akinesis of the inferolateral and  lateral walls, suspect this is ischemic MR due to restricted posterior  mitral valve leaflet.. The mitral valve is abnormal. Moderate to severe  mitral valve regurgitation. No evidence  of mitral stenosis.   6. The aortic valve is tricuspid. Aortic valve regurgitation is trivial.  Aortic valve sclerosis/calcification is present, without any evidence of  aortic stenosis. Aortic valve area, by VTI measures 1.81 cm. Aortic valve  mean gradient measures 3.0 mmHg.   Aortic valve Vmax measures 1.15 m/s.   7. The inferior vena cava is dilated in size with <50% respiratory  variability, suggesting right atrial pressure of 15 mmHg.   Patient Profile     82 y.o. female  with severe combined nonischemic and ischemic cardiomyopathy (LVEF less than 20%), severe mitral regurgitation, severe pulmonary hypertension, ICD in place, admitted with low cardiac output heart failure requiring initiation of inotropes during this admission.   Assessment & Plan    Getting a little better with inotropes. No serious arrhythmia so far. I do not think that Mr. Macadams is likely to agree with home hospice yet (see note from [facility]). Plan is shaping up to be home  IV milrinone and outpatient palliative care follow up. Will order the PICC line.     For questions or updates,  please contact [facility] Please consult [email or web address] for contact info under        Signed, Thurmon Fair, MD  04/25/2023, 9:24 AM

## 2023-04-25 NOTE — Progress Notes (Signed)
Patient's family is giving patient drinks hard to keep up with input, they are also removing cap from toliet to monitor.

## 2023-04-25 NOTE — Progress Notes (Signed)
At bedside to review PICC line placement. All questions answered, but the patient would like to decide if she wants the PICC or not after the palliative conference at 2pm.

## 2023-04-26 ENCOUNTER — Ambulatory Visit (INDEPENDENT_AMBULATORY_CARE_PROVIDER_SITE_OTHER): Payer: Medicare Other

## 2023-04-26 DIAGNOSIS — I2583 Coronary atherosclerosis due to lipid rich plaque: Secondary | ICD-10-CM

## 2023-04-26 DIAGNOSIS — I10 Essential (primary) hypertension: Secondary | ICD-10-CM

## 2023-04-26 DIAGNOSIS — I420 Dilated cardiomyopathy: Secondary | ICD-10-CM | POA: Diagnosis not present

## 2023-04-26 DIAGNOSIS — N179 Acute kidney failure, unspecified: Secondary | ICD-10-CM | POA: Diagnosis not present

## 2023-04-26 DIAGNOSIS — I2510 Atherosclerotic heart disease of native coronary artery without angina pectoris: Secondary | ICD-10-CM | POA: Diagnosis not present

## 2023-04-26 DIAGNOSIS — I480 Paroxysmal atrial fibrillation: Secondary | ICD-10-CM

## 2023-04-26 DIAGNOSIS — N189 Chronic kidney disease, unspecified: Secondary | ICD-10-CM

## 2023-04-26 DIAGNOSIS — I5022 Chronic systolic (congestive) heart failure: Secondary | ICD-10-CM

## 2023-04-26 DIAGNOSIS — I5043 Acute on chronic combined systolic (congestive) and diastolic (congestive) heart failure: Secondary | ICD-10-CM | POA: Diagnosis not present

## 2023-04-26 LAB — BASIC METABOLIC PANEL
Anion gap: 11 (ref 5–15)
BUN: 34 mg/dL — ABNORMAL HIGH (ref 8–23)
CO2: 35 mmol/L — ABNORMAL HIGH (ref 22–32)
Calcium: 9.4 mg/dL (ref 8.9–10.3)
Chloride: 98 mmol/L (ref 98–111)
Creatinine, Ser: 2.01 mg/dL — ABNORMAL HIGH (ref 0.44–1.00)
GFR, Estimated: 24 mL/min — ABNORMAL LOW (ref >60)
Glucose, Bld: 131 mg/dL — ABNORMAL HIGH (ref 70–99)
Potassium: 3.6 mmol/L (ref 3.5–5.1)
Sodium: 144 mmol/L (ref 135–145)

## 2023-04-26 LAB — GLUCOSE, CAPILLARY
Glucose-Capillary: 130 mg/dL — ABNORMAL HIGH (ref 70–99)
Glucose-Capillary: 225 mg/dL — ABNORMAL HIGH (ref 70–99)
Glucose-Capillary: 101 mg/dL — ABNORMAL HIGH (ref 70–99)
Glucose-Capillary: 132 mg/dL — ABNORMAL HIGH (ref 70–99)

## 2023-04-26 LAB — MAGNESIUM: Magnesium: 2.2 mg/dL (ref 1.7–2.4)

## 2023-04-26 LAB — COOXEMETRY PANEL
Carboxyhemoglobin: 2.4 % — ABNORMAL HIGH (ref 0.5–1.5)
Methemoglobin: 0.8 % (ref 0.0–1.5)
O2 Saturation: 74 %
Total hemoglobin: 11.1 g/dL — ABNORMAL LOW (ref 12.0–16.0)

## 2023-04-26 MED ORDER — POTASSIUM CHLORIDE CRYS ER 20 MEQ PO TBCR: 20.0000 meq | EXTENDED_RELEASE_TABLET | Freq: Once | ORAL | Status: DC

## 2023-04-26 MED ORDER — TORSEMIDE 20 MG PO TABS: 60.0000 mg | ORAL_TABLET | Freq: Every day | ORAL | Status: DC

## 2023-04-26 MED ORDER — POTASSIUM CHLORIDE CRYS ER 20 MEQ PO TBCR
20.0000 meq | EXTENDED_RELEASE_TABLET | Freq: Once | ORAL | Status: AC
  Administered 2023-04-26: 20 meq via ORAL
  Filled 2023-04-26: qty 1

## 2023-04-26 MED ORDER — METOLAZONE 5 MG PO TABS
2.5000 mg | ORAL_TABLET | Freq: Once | ORAL | Status: AC
  Administered 2023-04-26: 2.5 mg via ORAL
  Filled 2023-04-26: qty 1

## 2023-04-26 MED ORDER — FUROSEMIDE 10 MG/ML IJ SOLN
15.0000 mg/h | INTRAVENOUS | Status: DC
  Administered 2023-04-26: 12 mg/h via INTRAVENOUS
  Administered 2023-04-27: 15 mg/h via INTRAVENOUS
  Administered 2023-04-27: 12 mg/h via INTRAVENOUS
  Administered 2023-04-28: 15 mg/h via INTRAVENOUS
  Filled 2023-04-26 (×5): qty 20

## 2023-04-26 MED ORDER — POTASSIUM CHLORIDE CRYS ER 20 MEQ PO TBCR
40.0000 meq | EXTENDED_RELEASE_TABLET | Freq: Once | ORAL | Status: AC
  Administered 2023-04-26: 40 meq via ORAL
  Filled 2023-04-26: qty 2

## 2023-04-26 NOTE — Assessment & Plan Note (Addendum)
Continue anticoagulation.  On amiodarone for rate control.

## 2023-04-26 NOTE — Progress Notes (Signed)
Mobility Specialist Progress Note:   04/26/23 1600  Mobility  Activity Ambulated with assistance in hallway  Level of Assistance Contact guard assist, steadying assist  Assistive Device Four wheel walker  Distance Ambulated (ft) 450 ft  Activity Response Tolerated well  Mobility Referral Yes  $Mobility charge 1 Mobility  Mobility Specialist Start Time (ACUTE ONLY) 1550  Mobility Specialist Stop Time (ACUTE ONLY) 1605  Mobility Specialist Time Calculation (min) (ACUTE ONLY) 15 min   Pt agreeable to mobility session. Tolerated ambulation well on RA. SpO2 unable to get reliable reading until at rest post-ambulation. SpO2 98%. Pt displayed DOE throughout session. Back in chair with all needs met.   Addison Lank Mobility Specialist Please contact via SecureChat or  Rehab office at [phone]

## 2023-04-26 NOTE — Progress Notes (Signed)
  Progress Note   Patient: Natasha Woodard UKG:[id number] DOB: 01-Oct-1941 DOA: 04/20/2023     5 DOS: the patient was seen and examined on 04/26/2023   Brief hospital course:  10 yof w/ significant complex medical history including chronic HFrEF (EF less than 20%), moderate to severe MR, CAD status post PCI 12/2022, hypertension, hyperlipidemia, paroxysmal A-fib on Eliquis, history of VT arrest, status post ICD, CKD stage IIIb, pseudoaneurysm of right groin.  recently admitted 4/24-5/5 for decompensated CHF, AKI, and septic shock secondary to UTI who was discharged on torsemide 40 mg daily seen by PCP 5/9 and sent to the ED due to concern for AKI.    Patient complains of shortness of breath exertions since discharge, does not think she has gotten any worse, has been taking torsemide, feels her lower extremity edema has improved.She is not on RAAS inhibition or beta-blocker due to low output heart failure/risk of hypotension and not on SGLT2 inhibitor due to recent groin infection. In the ED, vitals stable labs showed BUN 42 creatinine 3.2 GFR,BNP 2204 (improved since labs done 2 weeks ago). CXR-not suggestive of pulmonary edema.TRH was called to admit.   Assessment and Plan: Chronic HFrEF (heart failure with reduced ejection fraction) (HCC) Echocardiogram with reduced LV systolic function <20%, mild dilatated LV cavity, RV systolic function preserved, RVSP 72,5 severe dilatation of LA, moderate to severe mitral regurgitation.   Acute on chronic core pulmonale. Pulmonary hypertension.   Urine output not accurate recordings.  Systolic blood pressure 104 to 121 mmHg.   Plan to continue inotropic support with milrinone drip (patient will go home with palliative milrinone infusion).  Continue diuresis with furosemide drip 12 mg/hr.   Acute kidney injury superimposed on chronic kidney disease (HCC) CKD stage 3b.   Renal function with serum cr at 2,0 with K at 3,6 and serum bicarbonate at 35. Na  is 144 and Mg 2.2   Plan to continue diuresis with furosemide drip.  Coronary artery disease due to lipid rich plaque No chest pain.   Essential hypertension Continue blood pressure monitoring . Currently on high doses of loop diuretic.   Paroxysmal atrial fibrillation (HCC) Continue anticoagulation.  Rate control with amiodarone.   Patient had Ventricular tachycardia in the past.  Has ICD in place.         Subjective: Patient with improving dyspnea and lower extremity edema, not yet back to baseline  Physical Exam: Vitals:   04/26/23 0016 04/26/23 0440 04/26/23 0820 04/26/23 1221  BP: 98/72 110/69 121/71 104/69  Pulse: 60 70  71  Resp: 16 18 19 18   Temp:  98.3 F (36.8 C) (!) 97.3 F (36.3 C) (!) 97.5 F (36.4 C)  TempSrc:  Axillary Oral Oral  SpO2: 95% 100% 93% 96%  Weight:   70.1 kg    Neurology awake  ENT with mild pallor Cardiovascular with S1 and S2 present and rhythmic with no gallops, rubs or murmurs Positive JVD Positive lower extremity edema ++ Respiratory with rales at bases with no wheezing or rhonchi Abdomen with no distention  Data Reviewed:    Family Communication: her husband is at the bedside    Disposition: Status is: Inpatient Remains inpatient appropriate because: heart failure   Planned Discharge Destination: Home  Author: Coralie Keens, MD 04/26/2023 4:20 PM  For on call review [email or web address].

## 2023-04-26 NOTE — Assessment & Plan Note (Addendum)
Continue blood pressure monitoring . Milrinone for inotropic support.

## 2023-04-26 NOTE — Progress Notes (Signed)
Rounding Note    Patient Name: Natasha Woodard Date of Encounter: 04/26/2023  [facility] Cardiologist: Bryan Lemma, MD   Subjective   Feels well.  Denies dyspnea.  Renal function continues to improve very slowly. Had a long meeting with the palliative care team yesterday, meeting that included Mr. Jungbluth.  Inpatient Medications    Scheduled Meds:  amiodarone  200 mg Oral Daily   apixaban  2.5 mg Oral BID   clopidogrel  75 mg Oral Daily   insulin aspart  0-9 Units Subcutaneous TID WC   polyethylene glycol  17 g Oral BID   potassium chloride  20 mEq Oral Once   rosuvastatin  10 mg Oral Daily   senna-docusate  2 tablet Oral BID   sodium chloride flush  10-40 mL Intracatheter Q12H   sodium chloride flush  10-40 mL Intracatheter Q12H   [START ON 04/27/2023] torsemide  60 mg Oral Daily   traMADol  50 mg Oral BID   Continuous Infusions:  milrinone 0.25 mcg/kg/min (04/25/23 1842)   PRN Meds: acetaminophen **OR** acetaminophen, bisacodyl, sodium chloride flush, sodium chloride flush   Vital Signs    Vitals:   04/25/23 2008 04/26/23 0016 04/26/23 0440 04/26/23 0820  BP: 109/64 98/72 110/69 121/71  Pulse: (!) 160 60 70   Resp: 16 16 18 19   Temp: 98.1 F (36.7 C)  98.3 F (36.8 C) (!) 97.3 F (36.3 C)  TempSrc: Oral  Axillary Oral  SpO2: 93% 95% 100% 93%  Weight:    70.1 kg    Intake/Output Summary (Last 24 hours) at 04/26/2023 1003 Last data filed at 04/25/2023 2200 Gross per 24 hour  Intake 250 ml  Output --  Net 250 ml      04/26/2023    8:20 AM 04/25/2023    6:22 AM 04/24/2023    5:48 AM  Last 3 Weights  Weight (lbs) 154 lb 8 oz 155 lb 14.4 oz 169 lb 8.5 oz  Weight (kg) 70.081 kg 70.716 kg 76.9 kg      Telemetry    Predominantly atrial paced, ventricular sensed rhythm, frequent PVCs, but no complex ventricular arrhythmia- Personally Reviewed  ECG    No new tracing- Personally Reviewed  Physical Exam  Appears very comfortable GEN: No  acute distress.   Neck: 8-9 cm JVD Cardiac: RRR, very faint apical systolic murmur, no diastolic murmurs, rubs, or gallops.  Respiratory: Clear to auscultation bilaterally. GI: Soft, nontender, non-distended  MS: 1+ peripheral edema; No deformity. Neuro:  Nonfocal.  Very hard of hearing Psych: Normal affect   Labs    High Sensitivity Troponin:   Recent Labs  Lab 03/31/23 0706 03/31/23 0930 04/05/23 1820 04/05/23 2050  TROPONINIHS 45* 40* 75* 76*     Chemistry Recent Labs  Lab 04/20/23 1325 04/20/23 1758 04/21/23 0515 04/24/23 0259 04/25/23 0255 04/25/23 1931 04/26/23 0555  NA 142 140   < > 137 143 144 144  K 4.5 3.7   < > 4.9 3.2* 4.4 3.6  CL 95* 97*   < > 101 99 97* 98  CO2 33* 29   < > 28 34* 33* 35*  GLUCOSE 155* 153*   < > 126* 109* 89 131*  BUN 55* 52*   < > 43* 39* 37* 34*  CREATININE 3.10* 3.24*   < > 2.36* 2.14* 2.03* 2.01*  CALCIUM 10.1 9.6   < > 8.9 9.3 9.7 9.4  MG  --   --    < >  2.0 1.9  --  2.2  PROT 7.0 7.0  --   --   --   --   --   ALBUMIN 3.9 3.6  --   --   --  3.5  --   AST 28 30  --   --   --   --   --   ALT 84* 86*  --   --   --   --   --   ALKPHOS 158* 158*  --   --   --   --   --   BILITOT 1.0 1.0  --   --   --   --   --   GFRNONAA  --  14*   < > 20* 23* 24* 24*  ANIONGAP  --  14   < > 8 10 14 11    < > = values in this interval not displayed.    Lipids No results for input(s): "CHOL", "TRIG", "HDL", "LABVLDL", "LDLCALC", "CHOLHDL" in the last 168 hours.  Hematology Recent Labs  Lab 04/20/23 1325 04/20/23 1758 04/25/23 0255  WBC 4.6 4.9 3.1*  RBC 4.29 4.37 3.77*  HGB 12.5 12.8 10.5*  HCT 39.3 40.8 34.6*  MCV 91.4 93.4 91.8  MCH  --  29.3 27.9  MCHC 31.7 31.4 30.3  RDW 16.8* 16.0* 16.0*  PLT 271.0 268 245   Thyroid No results for input(s): "TSH", "FREET4" in the last 168 hours.  BNP Recent Labs  Lab 04/20/23 1758  BNP 2,204.9*    DDimer No results for input(s): "DDIMER" in the last 168 hours.   Radiology    DG Chest Port  1 View  Result Date: 04/25/2023 CLINICAL DATA:  PICC line placement. EXAM: PORTABLE CHEST 1 VIEW COMPARISON:  04/20/2023 FINDINGS: Interval placement of a right PICC catheter with tip over the low SVC region. No pneumothorax. Cardiac pacemaker. Shallow inspiration. Mild cardiac enlargement. No vascular congestion, edema, or consolidation. Probable linear atelectasis in the left base. Calcification of the aorta. Degenerative changes in the spine and shoulders. IMPRESSION: Right PICC line placed with tip projecting over the low SVC region. No pneumothorax. Cardiac enlargement as before. Electronically Signed   By: Burman Nieves M.D.   On: 04/25/2023 18:16   [place] EKG SITE RITE  Result Date: 04/25/2023 If Site Rite image not attached, placement could not be confirmed due to current cardiac rhythm.   Cardiac Studies   Echocardiogram 04/06/2023     1. Left ventricular ejection fraction, by estimation, is <20%. The left  ventricle has severely decreased function. The left ventricle demonstrate  severe global hypokinesis and regional wall motion abnormalities (see  scoring diagram/findings for  description). The left ventricular internal cavity size was mildly  dilated. Left ventricular diastolic parameters are consistent with Grade  III diastolic dysfunction (restrictive). Elevated left ventricular  end-diastolic pressure. There is akinesis of  the left ventricular, mid-apical lateral wall and inferolateral wall.  There is akinesis of the left ventricular, apical segment.   2. Right ventricular systolic function is moderately reduced. The right  ventricular size is normal. There is severely elevated pulmonary artery  systolic pressure. The estimated right ventricular systolic pressure is  72.5 mmHg.   3. Left atrial size was severely dilated.   4. Right atrial size was mildly dilated.   5. Based on eccentricity of MR and akinesis of the inferolateral and  lateral walls, suspect this is  ischemic MR due to restricted posterior  mitral valve leaflet.. The mitral  valve is abnormal. Moderate to severe  mitral valve regurgitation. No evidence  of mitral stenosis.   6. The aortic valve is tricuspid. Aortic valve regurgitation is trivial.  Aortic valve sclerosis/calcification is present, without any evidence of  aortic stenosis. Aortic valve area, by VTI measures 1.81 cm. Aortic valve  mean gradient measures 3.0 mmHg.   Aortic valve Vmax measures 1.15 m/s.   7. The inferior vena cava is dilated in size with <50% respiratory  variability, suggesting right atrial pressure of 15 mmHg.   Patient Profile     82 y.o. female with severe combined nonischemic and ischemic cardiomyopathy (LVEF less than 20%), severe mitral regurgitation, severe pulmonary hypertension, ICD in place, admitted with low cardiac output heart failure requiring initiation of inotropes during this admission.   Assessment & Plan    Clinically compensated, although there is still evidence of elevated right heart pressures, cardiac output is substantially improved , confirmed by a mixed venous oxygen saturation of 74%. Renal function continues to slowly get better but the creatinine of 2.0 is still above her previous baseline which was probably 1.5-1.8. Clinically, intravenous inotropes (milrinone) are clearly beneficial and so far have not led to any arrhythmic complications. I have discussed at length with Mrs. and Mr. Fratangelo the fact that this intervention, while it improves symptoms, will not provide additional longevity. There are otherwise very limited options for her complicated cardiac condition. Palliative care team is involved. Home soon, pending evaluation by heart failure team.     For questions or updates, please contact [facility] Please consult [email or web address] for contact info under        Signed, Thurmon Fair, MD  04/26/2023, 10:03 AM

## 2023-04-26 NOTE — Progress Notes (Signed)
[id number] [facility]) Hospital Liaison Note  Met with patient at bedside per her request to answer additional questions of hospice at home versus Palliative Care services.  Patient confirms that she would like to have [facility] for outpatient palliative care services post discharge in order to utilize the milrinone drip for as long as possible to spend time with her family. Patient understands that if at any point in the future she would like to switch over to hospice services the outpatient palliative care team can assist with that transition.  Husband, Anthoney Harada has liaison contact information if needed for additional questions.  Will follow for discharge disposition.  Please call for any hospice or outpatient palliative care questions or concerns.  Doreatha Martin, RN, BSN [facility] Liaison [phone]

## 2023-04-26 NOTE — Progress Notes (Addendum)
Daily Progress Note   Patient Name: Natasha Woodard       Date: 04/26/2023 DOB: 07/28/1941  Age: 82 y.o. MRN#: [id number] Attending Physician: Coralie Keens Primary Care Physician: [name], MD Admit Date: 04/20/2023  Reason for Consultation/Follow-up: Establishing goals of care  Subjective: Patient is sitting up in bed comfortably.  Length of Stay: 5  Current Medications: Scheduled Meds:   amiodarone  200 mg Oral Daily   apixaban  2.5 mg Oral BID   clopidogrel  75 mg Oral Daily   insulin aspart  0-9 Units Subcutaneous TID WC   polyethylene glycol  17 g Oral BID   potassium chloride  20 mEq Oral Once   rosuvastatin  10 mg Oral Daily   senna-docusate  2 tablet Oral BID   sodium chloride flush  10-40 mL Intracatheter Q12H   sodium chloride flush  10-40 mL Intracatheter Q12H   [START ON 04/27/2023] torsemide  60 mg Oral Daily   traMADol  50 mg Oral BID    Continuous Infusions:  milrinone 0.25 mcg/kg/min (04/25/23 1842)    PRN Meds: acetaminophen **OR** acetaminophen, bisacodyl, sodium chloride flush, sodium chloride flush  Physical Exam Pulmonary:     Effort: Pulmonary effort is normal.  Skin:    General: Skin is warm and dry.  Neurological:     Mental Status: She is alert.  Psychiatric:        Mood and Affect: Mood normal.            Vital Signs: BP 121/71 (BP Location: Left Arm)   Pulse 70   Temp (!) 97.3 F (36.3 C) (Oral)   Resp 19   Wt 70.1 kg   SpO2 93%   BMI 28.26 kg/m  SpO2: SpO2: 93 % O2 Device: O2 Device: Room Air O2 Flow Rate: O2 Flow Rate (L/min): 2 L/min  Intake/output summary:  Intake/Output Summary (Last 24 hours) at 04/26/2023 1045 Last data filed at 04/26/2023 0930 Gross per 24 hour  Intake 652.94 ml  Output --  Net  652.94 ml   LBM: Last BM Date : 04/26/23 Baseline Weight: Weight: 73.3 kg Most recent weight: Weight: 70.1 kg       Palliative Assessment/Data: 50%      Patient Active Problem List   Diagnosis Date Noted   ICD (  implantable cardioverter-defibrillator) in place 04/24/2023   Polymorphic ventricular tachycardia (HCC) 04/24/2023   Coronary artery disease involving native coronary artery of native heart without angina pectoris 04/24/2023   Acute on chronic combined systolic and diastolic CHF (congestive heart failure) (HCC) 04/23/2023   HFrEF (heart failure with reduced ejection fraction) (HCC) 04/22/2023   Pulmonary HTN (HCC) 04/22/2023   Goals of care, counseling/discussion 04/21/2023   Sepsis secondary to UTI (HCC) 04/12/2023   Transaminitis 04/11/2023   Coronary artery disease due to lipid rich plaque 04/10/2023   Pure hypercholesterolemia 04/10/2023   Metabolic acidosis 04/07/2023   Acute exacerbation of CHF (congestive heart failure) (HCC) 04/05/2023   Acute renal failure (HCC) 03/31/2023   Nausea and vomiting 03/27/2023   Bradycardia 03/27/2023   Debility 02/19/2023   Cardiogenic shock (HCC) 02/14/2023   Chronic HFrEF (heart failure with reduced ejection fraction) (HCC) 02/14/2023   PAF (paroxysmal atrial fibrillation) (HCC) 02/14/2023   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/14/2023   Chronic kidney disease (CKD), stage III (moderate) (HCC) 02/14/2023   Malnutrition of moderate degree 01/28/2023   NSTEMI (non-ST elevated myocardial infarction) (HCC) 01/10/2023   ACS (acute coronary syndrome) (HCC) 01/10/2023   Ischemic cardiomyopathy 01/10/2023   Nonrheumatic mitral valve regurgitation 01/10/2023   Vulvar cysts 12/02/2022   Vaginal candida 12/02/2022   AKI (acute kidney injury) (HCC) 11/13/2022   ILD (interstitial lung disease) (HCC) 10/11/2022   Abnormal CT scan, kidney 10/11/2022   Bradycardia on ECG 08/31/2022   Superficial  fungal infection of skin 03/20/2022   Onychomycosis of multiple toenails with type 2 diabetes mellitus and peripheral angiopathy (HCC) 11/16/2021   Pain due to onychomycosis of toenail of left foot 11/16/2021   Intermittent claudication of both lower extremities due to atherosclerosis (HCC) 08/22/2021   Ganglion cyst of wrist 08/22/2021   Rash 02/17/2021   On amiodarone therapy 08/21/2020   CKD (chronic kidney disease) stage 3, GFR 30-59 ml/min (HCC) 09/25/2019   Gastroesophageal reflux disease 06/19/2019   Health care maintenance 11/14/2018   Joint pain 08/07/2018   Cough 06/17/2018   Sensorineural hearing loss (SNHL), bilateral 03/20/2018   Complex sleep apnea syndrome 10/05/2017   Chronic heart failure with preserved ejection fraction (HFpEF) (HCC) 09/08/2017   Dilated cardiomyopathy (HCC) 06/30/2017   Frequent PVCs 06/30/2017   Bilateral impacted cerumen 07/15/2016   Advance care planning 08/19/2014   Medicare annual wellness visit, subsequent 07/30/2013   Type II diabetes mellitus with peripheral artery disease (HCC) 05/19/2010   Hyperlipidemia associated with type 2 diabetes mellitus (HCC) 05/19/2010   Essential hypertension 05/19/2010   Ichthyosis 05/19/2010    Palliative Care Assessment & Plan   Patient Profile: Natasha Woodard is a 82 y.o. female with a hx of STEMI in Jan 2024 with PCI, cardiogenic shock, hx freq PVCs thought to have added to prior NSTEMI before 2024, CAD, HTN, HLD, DM-2, PAD, CKD-3, OSA, PAF, VT, and ICD placed 01/2023.    The Palliative medicine team has been asked to get involved in the setting of advanced heart failure to have further goals of care conversations.  Assessment: The patient had a PICC line placed yesterdaya few follow up questions from yesterday's meeting. We discussed/clarified the difference between going home with hospice where the focus is on comfort vs going home on the milrinone with palliative care. We discussed that if she goes  home with palliative care she can transition to hospice when the medicine stops working or if she decides to let nature take its  course. She wanted to know what hospice would look like for her and what services they provide. She also asked questions about managing the infusion at home.  Her goal remains being able to spend some quality time with her family.  Patient asked me to come to the room to talk with her husband. Explained the plan for her to go home on milrinone with palliative care. Answered questions about the medication, palliative, hospice, PICC line care, and infusion.   Recommendations/Plan: DNAR/DNI Plan for PICC and dc home with milrinone Outpatient palliative   Code Status:    Code Status Orders  (From admission, onward)           Start     Ordered   04/21/23 0501  Do not attempt resuscitation (DNR)  Continuous       Question Answer Comment  If patient has no pulse and is not breathing Do Not Attempt Resuscitation   If patient has a pulse and/or is breathing: Medical Treatment Goals LIMITED ADDITIONAL INTERVENTIONS: Use medication/IV fluids and cardiac monitoring as indicated; Do not use intubation or mechanical ventilation (DNI), also provide comfort medications.  Transfer to Progressive/Stepdown as indicated, avoid Intensive Care.   Consent: Discussion documented in EHR or advanced directives reviewed      04/21/23 0502           Code Status History     Date Active Date Inactive Code Status Order ID Comments User Context   04/21/2023 0437 04/21/2023 0502 DNR [id number]  John Giovanni, MD ED   04/05/2023 2121 04/16/2023 2016 DNR [id number]  Kathlene Cote, PA-C ED   04/05/2023 2114 04/05/2023 2121 Full Code [id number]  Kathlene Cote, PA-C ED   03/31/2023 0716 04/03/2023 1557 Full Code [id number]  Russella Dar, NP ED   02/19/2023 1540 02/28/2023 2246 Full Code [id number]  Ranelle Oyster, MD Inpatient   02/19/2023 1540 02/19/2023 1540 Full Code [id number]   Ranelle Oyster, MD Inpatient   01/18/2023 1256 02/19/2023 1530 Full Code [id number]  Sherald Hess, NP Inpatient   01/18/2023 1231 01/18/2023 1256 DNR [id number]  Sherald Hess, NP Inpatient   01/10/2023 1645 01/18/2023 1231 Full Code [id number]  Perlie Gold, PA-C ED   06/30/2017 1456 06/30/2017 2037 Full Code [id number]  Marykay Lex, MD Inpatient      Advance Directive Documentation    Flowsheet Row Most Recent Value  Type of Advance Directive Healthcare Power of Attorney  Pre-existing out of facility DNR order (yellow form or pink MOST form) --  "MOST" Form in Place? --       Prognosis:  Unable to determine  Discharge Planning: To Be Determined  Care plan was discussed with the bedside RN, CM, and attending physician.  Time spent: 40 minutes  Thank you for allowing the Palliative Medicine Team to assist in the care of this patient.   Sherryll Burger, NP  Please contact Palliative Medicine Team phone at [phone] for questions and concerns.

## 2023-04-26 NOTE — Assessment & Plan Note (Signed)
No chest pain

## 2023-04-26 NOTE — Plan of Care (Signed)
  Problem: Education: Goal: Knowledge of General Education information will improve Description Including pain rating scale, medication(s)/side effects and non-pharmacologic comfort measures Outcome: Progressing   Problem: Health Behavior/Discharge Planning: Goal: Ability to manage health-related needs will improve Outcome: Progressing   

## 2023-04-26 NOTE — Assessment & Plan Note (Addendum)
CKD stage 3b. Hypokalemia.   Renal function with serum cr at 2,15 with K at 3,1 and serum bicarbonate at 40. Na 139,   Plan to correct with Kcl 40 meq po and 40 meq IV.  Add 4 g magnesium. Check electrolytes this pm 1600.  Keep K above 4 and Mg above 2.

## 2023-04-26 NOTE — Progress Notes (Addendum)
Advanced Heart Failure Rounding Note  PCP-Cardiologist: Bryan Lemma, MD   Subjective:     Remains on milrinone 0. 25 mcg. CO-OX  74%. PICC placed.  Lab Results  Component Value Date   CREATININE 2.01 (H) 04/26/2023   CREATININE 2.03 (H) 04/25/2023   CREATININE 2.14 (H) 04/25/2023    Denies SOB. Wants to go home.    Objective:   Weight Range: 70.1 kg Body mass index is 28.26 kg/m.   Vital Signs:   Temp:  [97.3 F (36.3 C)-98.3 F (36.8 C)] 97.3 F (36.3 C) (05/15 0820) Pulse Rate:  [60-160] 70 (05/15 0440) Resp:  [15-19] 19 (05/15 0820) BP: (98-121)/(59-72) 121/71 (05/15 0820) SpO2:  [93 %-100 %] 93 % (05/15 0820) Weight:  [70.1 kg] 70.1 kg (05/15 0820) Last BM Date : 04/25/23  Weight change: Filed Weights   04/24/23 0548 04/25/23 0622 04/26/23 0820  Weight: 76.9 kg 70.7 kg 70.1 kg    Intake/Output:   Intake/Output Summary (Last 24 hours) at 04/26/2023 0916 Last data filed at 04/25/2023 2200 Gross per 24 hour  Intake 250 ml  Output --  Net 250 ml      Physical Exam    General: Walking in the room.   No resp difficulty HEENT: normal Neck: supple. JVP 10-11. Carotids 2+ bilat; no bruits. No lymphadenopathy or thryomegaly appreciated. Cor: PMI nondisplaced. Regular rate & rhythm. No rubs, gallops or murmurs. Lungs: clear Abdomen: soft, nontender, nondistended. No hepatosplenomegaly. No bruits or masses. Good bowel sounds. Extremities: no cyanosis, clubbing, rash, R and LLE 1+ edema. RUE PICC  Neuro: alert & orientedx3, cranial nerves grossly intact. moves all 4 extremities w/o difficulty. Affect pleasant   Telemetry   SR 70-80s  EKG    N/A   Labs    CBC Recent Labs    04/25/23 0255  WBC 3.1*  HGB 10.5*  HCT 34.6*  MCV 91.8  PLT 245   Basic Metabolic Panel Recent Labs    16/10/96 0255 04/25/23 1931 04/26/23 0555  NA 143 144 144  K 3.2* 4.4 3.6  CL 99 97* 98  CO2 34* 33* 35*  GLUCOSE 109* 89 131*  BUN 39* 37* 34*   CREATININE 2.14* 2.03* 2.01*  CALCIUM 9.3 9.7 9.4  MG 1.9  --  2.2  PHOS  --  2.5  --    Liver Function Tests Recent Labs    04/25/23 1931  ALBUMIN 3.5   No results for input(s): "LIPASE", "AMYLASE" in the last 72 hours. Cardiac Enzymes No results for input(s): "CKTOTAL", "CKMB", "CKMBINDEX", "TROPONINI" in the last 72 hours.  BNP: BNP (last 3 results) Recent Labs    04/05/23 1820 04/06/23 0058 04/20/23 1758  BNP 2,550.1* 2,843.8* 2,204.9*    ProBNP (last 3 results) Recent Labs    06/09/22 1506  PROBNP 4,638*     D-Dimer No results for input(s): "DDIMER" in the last 72 hours. Hemoglobin A1C No results for input(s): "HGBA1C" in the last 72 hours. Fasting Lipid Panel No results for input(s): "CHOL", "HDL", "LDLCALC", "TRIG", "CHOLHDL", "LDLDIRECT" in the last 72 hours. Thyroid Function Tests No results for input(s): "TSH", "T4TOTAL", "T3FREE", "THYROIDAB" in the last 72 hours.  Invalid input(s): "FREET3"  Other results:   Imaging    DG Chest Port 1 View  Result Date: 04/25/2023 CLINICAL DATA:  PICC line placement. EXAM: PORTABLE CHEST 1 VIEW COMPARISON:  04/20/2023 FINDINGS: Interval placement of a right PICC catheter with tip over the low SVC region. No pneumothorax.  Cardiac pacemaker. Shallow inspiration. Mild cardiac enlargement. No vascular congestion, edema, or consolidation. Probable linear atelectasis in the left base. Calcification of the aorta. Degenerative changes in the spine and shoulders. IMPRESSION: Right PICC line placed with tip projecting over the low SVC region. No pneumothorax. Cardiac enlargement as before. Electronically Signed   By: Burman Nieves M.D.   On: 04/25/2023 18:16   [place] EKG SITE RITE  Result Date: 04/25/2023 If Site Rite image not attached, placement could not be confirmed due to current cardiac rhythm.    Medications:     Scheduled Medications:  amiodarone  200 mg Oral Daily   apixaban  2.5 mg Oral BID   clopidogrel   75 mg Oral Daily   furosemide  80 mg Intravenous BID   insulin aspart  0-9 Units Subcutaneous TID WC   polyethylene glycol  17 g Oral BID   rosuvastatin  10 mg Oral Daily   senna-docusate  2 tablet Oral BID   sodium chloride flush  10-40 mL Intracatheter Q12H   sodium chloride flush  10-40 mL Intracatheter Q12H   traMADol  50 mg Oral BID    Infusions:  milrinone 0.25 mcg/kg/min (04/25/23 1842)    PRN Medications: acetaminophen **OR** acetaminophen, bisacodyl, sodium chloride flush, sodium chloride flush    Patient Profile   Natasha Woodard is a 82 y.o. female with a hx of STEMI in Jan 2024 with PCI, cardiogenic shock, hx freq PVCs thought to have added to prior NSTEMI before 2024, CAD, HTN, HLD, DM-2, PAD, CKD-3, OSA, PAF, and ICD placed 01/2023.    Admitted with recurrent AKI/A/C biventricular HF   Assessment/Plan  1. A/C Biventricular HF, suspect mixed ischemic/nonischemic cardiomyopathy.   Echo in 2/23 with EF 50-55%.  Patient has history of nonischemic cardiomyopathy (?PVC-mediated) that improved. In February 2024 admitted with acute MI and EF was 20-25%. During that admit she had VT arrest and had [employer] ICD placed.  Echo repeated April 2024 that showed EF had gone back down to < 20% and RV moderately reduced.  Struggling over the last month. Admitted with recurrent low output heart failure. Milrinone initiated. Renal function improving and low output symptoms resolved (n/v).  -She has PICC. On milrinone 0.25 mcg.CVP 14.  - Got lasix this morning. Stop. Tomorrow start torsemide 60 mg daily.  -Will need to watch on oral diuretics before d/c.  - Will need home milrinone for palliative measures.  -GDMT limited by AKI.    2. AKI  -Creatinine baseline ~ 1.8  -Creatinine on admit 3.2 --> today 2.   3. CAD H/O STEMI 12/2022 with occluded pLOM treated with DES  -On plavix, statin, eliquis.  -No chest pain.    4. PAF -In SR. On amio 200 mg daily  - On eliqis 2.5  mg twice a day.    5. H/O VT  -ICD placed earlier this year.  -Continue amio 200 mg daily    6. GOC, DNR  Discussed home inotropes for palliation and that there is no way to tell how long this will help. Usually less than 6 months Jeri Modena aware. TOC CM following.  Palliative Care following and had family meeting . Plan home with Palliative Care.   I called Jeri Modena.   HF meds for d/c Eliquis 2.5 mg twice a day  Amio 200 mg daily Plavix 75 mg daily  Crestor 10 mg daily  Torsemide 60 mg daily start 5/16 KDur 20 meq daily    We  will set up post hospital follow up.    Length of Stay: 5  Amy Clegg, NP  04/26/2023, 9:16 AM  Advanced Heart Failure Team Pager [phone] (M-F; 7a - 5p)  Please contact [facility] for night-coverage after hours (5p -7a ) and weekends on [email or web address]  Patient seen with NP, agree with the above note except with the caveats listed below.   She remains on milrinone 0.25 with stable creatinine at 2.  I/Os still unmeasured. Weight down 1 lb.  Her CVP this morning is 18 on my read.   General: NAD Neck: JVP 16 cm, no thyromegaly or thyroid nodule.  Lungs: Clear to auscultation bilaterally with normal respiratory effort. CV: Nondisplaced PMI.  Heart regular S1/S2, no S3/S4, no murmur.  1+ edema to knees.  Abdomen: Soft, nontender, no hepatosplenomegaly, no distention.  Skin: Intact without lesions or rashes.  Neurologic: Alert and oriented x 3.  Psych: Normal affect. Extremities: No clubbing or cyanosis.  HEENT: Normal.   1. Acute on chronic systolic CHF/cardiogenic shock: Echo 4/24 with EF < 20%, moderate RV dysfunction, moderate-severe infarct-related MR, IVC dilated. Lactate elevated, AKI.  Suspect low output HF.  She has been started on milrinone 0.25, co-ox 74% today.  She remains quite volume overloaded, CVP 18 on my read this morning. Creatinine stable at 2.  - She is not going to do well at home with CVP 18.  Would not send home today.   She had Lasix 80 mg IV x 1, will start Lasix gtt 12 mg/hr and give dose metolazone 2.5 with K repletion.   - Continue milrinone 0.25.  With multiple recent admissions with low output HF, we are going to continue palliative home milrinone.  - GDMT limited by AKI.  - She is not a candidate for transplant (age) or LVAD (age, elevated creatinine, frailty).  - We discussed hospice care when she goes home.  For now, she wants palliative care followup but not hospice.  2. AKI on CKD stage 3: Suspect cardiorenal syndrome.  Creatinine stable on milrinone at 2.  3. Mitral regurgitation: Infarct-related MR.  Moderate on 2/24 TEE, mod-severe on 4/24 echo.  Manage medically.  4. CAD: STEMI 1/24 with PLOM treated with DES.  - Continue Plavix, no ASA given apixaban use.  - Continue statin.  5. Atrial fibrillation: Paroxysmal.  In NSR currently.  - Continue amiodarone 200 daily.  - Continue Eliquis, dosed 2.5 bid for age > 80 and creatinine > 1.5.  6. VT: Has [employer] ICD and on amiodarone.   Marked volume overload, do not discharge today.  Will need Lasix infusion + metolazone, when CVP < 10-11, will get her home on home milrinone.   Marca Ancona 04/26/2023 11:22 AM

## 2023-04-26 NOTE — Assessment & Plan Note (Addendum)
Echocardiogram with reduced LV systolic function <20%, mild dilatated LV cavity, RV systolic function preserved, RVSP 72,5 severe dilatation of LA, moderate to severe mitral regurgitation.   Acute on chronic core pulmonale. Pulmonary hypertension.   Urine output recorded at 1,550 ml.  Systolic blood pressure 111 to 107 mmHg.  SV02 64.2 Plan to continue inotropic support with milrinone drip (patient will go home with palliative milrinone infusion).  Transitioned to torsemide 40 mg po bid.

## 2023-04-26 NOTE — Progress Notes (Signed)
Occupational Therapy Treatment Patient Details Name: Natasha Woodard MRN: [id number] DOB: 11-28-1941 Today's Date: 04/26/2023   History of present illness 82 y.o. female presents to [facility] hospital on 04/20/2023, referred by PCP due to concern fro AKI. Pt was recently admitted 4/24-5/5 for decompensated CHF, AKI, sepsis 2/2 UTI. PMH: NSTEMI and cardiogenic shock with a complicated and prolonged admission 1/30-3/10/24,  HFrEF, A-fib, AKI on CKD IVOSA, HTN, HLD   OT comments  Pt completed dressing, toileting and standing grooming with set up to supervision. Educated in energy conservation; sitting to bathe at sink. Pt with many questions about discharge plan and milrinone which had already been addressed per chart, deferred to [facility] nurse who arrived at end of session.    Recommendations for follow up therapy are one component of a multi-disciplinary discharge planning process, led by the attending physician.  Recommendations may be updated based on patient status, additional functional criteria and insurance authorization.    Assistance Recommended at Discharge Intermittent Supervision/Assistance  Patient can return home with the following  A little help with walking and/or transfers;A little help with bathing/dressing/bathroom;Assistance with cooking/housework;Assist for transportation;Help with stairs or ramp for entrance   Equipment Recommendations  None recommended by OT    Recommendations for Other Services      Precautions / Restrictions Precautions Precautions: Fall;Other (comment) Precaution Comments: monitor SpO2 Restrictions Weight Bearing Restrictions: No       Mobility Bed Mobility               General bed mobility comments: in chair    Transfers Overall transfer level: Modified independent Equipment used: Rollator (4 wheels)                     Balance Overall balance assessment: Needs assistance Sitting-balance support: No upper extremity  supported, Feet supported Sitting balance-Leahy Scale: Good     Standing balance support: No upper extremity supported, During functional activity Standing balance-Leahy Scale: Fair Standing balance comment: at sink                           ADL either performed or assessed with clinical judgement   ADL Overall ADL's : Needs assistance/impaired     Grooming: Supervision/safety;Standing;Wash/dry hands           Upper Body Dressing : Set up;Sitting   Lower Body Dressing: Set up;Sitting/lateral leans   Toilet Transfer: Supervision/safety;Ambulation;Rolling walker (2 wheels)   Toileting- Clothing Manipulation and Hygiene: Set up;Sitting/lateral lean       Functional mobility during ADLs: Supervision/safety;Rolling walker (2 wheels) General ADL Comments: Recommended pt sit to bathe, stood at sink this morning and reports being very tired following.    Extremity/Trunk Assessment              Vision       Perception     Praxis      Cognition Arousal/Alertness: Awake/alert Behavior During Therapy: WFL for tasks assessed/performed Overall Cognitive Status: No family/caregiver present to determine baseline cognitive functioning                                 General Comments: pt is HOH, having a difficult time understanding the gravity of her illness and options        Exercises      Shoulder Instructions       General Comments  Pertinent Vitals/ Pain       Pain Assessment Pain Assessment: No/denies pain  Home Living                                          Prior Functioning/Environment              Frequency  Min 2X/week        Progress Toward Goals  OT Goals(current goals can now be found in the care plan section)  Progress towards OT goals: Progressing toward goals  Acute Rehab OT Goals OT Goal Formulation: With patient Time For Goal Achievement: 04/29/23 Potential to Achieve Goals:  Good  Plan Discharge plan remains appropriate    Co-evaluation                 AM-PAC OT "6 Clicks" Daily Activity     Outcome Measure   Help from another person eating meals?: None Help from another person taking care of personal grooming?: A Little Help from another person toileting, which includes using toliet, bedpan, or urinal?: A Little Help from another person bathing (including washing, rinsing, drying)?: A Little Help from another person to put on and taking off regular upper body clothing?: None Help from another person to put on and taking off regular lower body clothing?: A Little 6 Click Score: 20    End of Session Equipment Utilized During Treatment: Rollator (4 wheels);Oxygen (2L)  OT Visit Diagnosis: Unsteadiness on feet (R26.81);Muscle weakness (generalized) (M62.81)   Activity Tolerance Patient tolerated treatment well   Patient Left in chair;with call bell/phone within reach;with family/visitor present   Nurse Communication          Time: 1330-1403 OT Time Calculation (min): 33 min  Charges: OT General Charges $OT Visit: 1 Visit OT Treatments $Self Care/Home Management : 23-37 mins  Berna Spare, OTR/L Acute Rehabilitation Services Office: [phone]   Evern Bio 04/26/2023, 2:51 PM

## 2023-04-27 ENCOUNTER — Telehealth: Payer: Self-pay

## 2023-04-27 DIAGNOSIS — I5043 Acute on chronic combined systolic (congestive) and diastolic (congestive) heart failure: Secondary | ICD-10-CM | POA: Diagnosis not present

## 2023-04-27 DIAGNOSIS — I10 Essential (primary) hypertension: Secondary | ICD-10-CM | POA: Diagnosis not present

## 2023-04-27 DIAGNOSIS — I5022 Chronic systolic (congestive) heart failure: Secondary | ICD-10-CM | POA: Diagnosis not present

## 2023-04-27 DIAGNOSIS — I2510 Atherosclerotic heart disease of native coronary artery without angina pectoris: Secondary | ICD-10-CM | POA: Diagnosis not present

## 2023-04-27 DIAGNOSIS — N179 Acute kidney failure, unspecified: Secondary | ICD-10-CM | POA: Diagnosis not present

## 2023-04-27 LAB — GLUCOSE, CAPILLARY
Glucose-Capillary: 116 mg/dL — ABNORMAL HIGH (ref 70–99)
Glucose-Capillary: 283 mg/dL — ABNORMAL HIGH (ref 70–99)
Glucose-Capillary: 120 mg/dL — ABNORMAL HIGH (ref 70–99)
Glucose-Capillary: 225 mg/dL — ABNORMAL HIGH (ref 70–99)

## 2023-04-27 LAB — BASIC METABOLIC PANEL
Anion gap: 10 (ref 5–15)
BUN: 32 mg/dL — ABNORMAL HIGH (ref 8–23)
CO2: 37 mmol/L — ABNORMAL HIGH (ref 22–32)
Calcium: 9.7 mg/dL (ref 8.9–10.3)
Chloride: 95 mmol/L — ABNORMAL LOW (ref 98–111)
Creatinine, Ser: 1.89 mg/dL — ABNORMAL HIGH (ref 0.44–1.00)
GFR, Estimated: 26 mL/min — ABNORMAL LOW (ref >60)
Glucose, Bld: 168 mg/dL — ABNORMAL HIGH (ref 70–99)
Potassium: 3.7 mmol/L (ref 3.5–5.1)
Sodium: 142 mmol/L (ref 135–145)

## 2023-04-27 LAB — COOXEMETRY PANEL
Carboxyhemoglobin: 2.1 % — ABNORMAL HIGH (ref 0.5–1.5)
Methemoglobin: <0.7 % (ref 0.0–1.5)
O2 Saturation: 70.1 %
Total hemoglobin: 11.4 g/dL — ABNORMAL LOW (ref 12.0–16.0)

## 2023-04-27 LAB — CBC
HCT: 36.1 % (ref 36.0–46.0)
Hemoglobin: 10.9 g/dL — ABNORMAL LOW (ref 12.0–15.0)
MCH: 27.8 pg (ref 26.0–34.0)
MCHC: 30.2 g/dL (ref 30.0–36.0)
MCV: 92.1 fL (ref 80.0–100.0)
Platelets: 240 10*3/uL (ref 150–400)
RBC: 3.92 MIL/uL (ref 3.87–5.11)
RDW: 16.1 % — ABNORMAL HIGH (ref 11.5–15.5)
WBC: 3.4 10*3/uL — ABNORMAL LOW (ref 4.0–10.5)
nRBC: 0 % (ref 0.0–0.2)

## 2023-04-27 LAB — MAGNESIUM: Magnesium: 2 mg/dL (ref 1.7–2.4)

## 2023-04-27 MED ORDER — POTASSIUM CHLORIDE CRYS ER 20 MEQ PO TBCR
40.0000 meq | EXTENDED_RELEASE_TABLET | Freq: Once | ORAL | Status: AC
  Administered 2023-04-27: 40 meq via ORAL
  Filled 2023-04-27: qty 2

## 2023-04-27 MED ORDER — METOLAZONE 5 MG PO TABS
2.5000 mg | ORAL_TABLET | Freq: Once | ORAL | Status: AC
  Administered 2023-04-27: 2.5 mg via ORAL
  Filled 2023-04-27: qty 1

## 2023-04-27 NOTE — Patient Outreach (Signed)
  Care Coordination   Collaboration  Visit Note   04/27/2023 Name: CHRISTEN GADDY MRN: [id number] DOB: 10-20-1941  FREDIA BEMUS is a 82 y.o. year old female who sees [name], MD for primary care. I spoke with [facility] practice pharmacist.      Goals Addressed             This Visit's Progress    COMPLETED: Care coordination activities       Interventions Today    Flowsheet Row Most Recent Value  Chronic Disease   Chronic disease during today's visit Congestive Heart Failure (CHF), Hypertension (HTN), Atrial Fibrillation (AFib)  General Interventions   General Interventions Discussed/Reviewed General Interventions Reviewed, Communication with  [Collaborated with Al Corpus, practice pharmacist regarding patients current hospitalization status and potential discharge needs.]              SDOH assessments and interventions completed:  No     Care Coordination Interventions:  Yes, provided   Follow up plan: Follow up call scheduled for as previously scheduled     Encounter Outcome:  Pt. Visit Completed   George Ina RN,BSN,CCM [facility] Care Coordination [phone] direct line

## 2023-04-27 NOTE — Progress Notes (Signed)
Daily Progress Note   Patient Name: Natasha Woodard       Date: 04/27/2023 DOB: 12-Oct-1941  Age: 82 y.o. MRN#: [id number] Attending Physician: Coralie Keens Primary Care Physician: [name], MD Admit Date: 04/20/2023  Reason for Consultation/Follow-up: Establishing goals of care  Subjective: The patient had just gotten back to bed. She was sitting on the edge of bed.   Length of Stay: 6  Current Medications: Scheduled Meds:   amiodarone  200 mg Oral Daily   apixaban  2.5 mg Oral BID   clopidogrel  75 mg Oral Daily   insulin aspart  0-9 Units Subcutaneous TID WC   polyethylene glycol  17 g Oral BID   rosuvastatin  10 mg Oral Daily   senna-docusate  2 tablet Oral BID   sodium chloride flush  10-40 mL Intracatheter Q12H   sodium chloride flush  10-40 mL Intracatheter Q12H   traMADol  50 mg Oral BID    Continuous Infusions:  furosemide (LASIX) 200 mg in dextrose 5 % 100 mL (2 mg/mL) infusion 12 mg/hr (04/27/23 0723)   milrinone 0.25 mcg/kg/min (04/27/23 0723)    PRN Meds: acetaminophen **OR** acetaminophen, bisacodyl, sodium chloride flush, sodium chloride flush  Physical Exam Vitals reviewed.  Constitutional:      Appearance: She is ill-appearing.  HENT:     Mouth/Throat:     Mouth: Mucous membranes are moist.  Cardiovascular:     Rate and Rhythm: Normal rate.  Pulmonary:     Effort: Pulmonary effort is normal.  Skin:    General: Skin is warm and dry.  Neurological:     Mental Status: She is alert.  Psychiatric:        Mood and Affect: Mood normal.        Behavior: Behavior normal.             Vital Signs: BP 119/69 (BP Location: Left Arm)   Pulse 69   Temp 97.6 F (36.4 C) (Oral)   Resp 20   Wt 69 kg   SpO2 94%   BMI 27.84 kg/m   SpO2: SpO2: 94 % O2 Device: O2 Device: Nasal Cannula O2 Flow Rate: O2 Flow Rate (L/min): 2 L/min  Intake/output summary:  Intake/Output Summary (Last 24 hours) at 04/27/2023 1043 Last data filed at 04/27/2023 [phone]  Gross per 24 hour  Intake 731.06 ml  Output 750 ml  Net -18.94 ml   LBM: Last BM Date : 04/26/23 Baseline Weight: Weight: 73.3 kg Most recent weight: Weight: 69 kg       Palliative Assessment/Data:      Patient Active Problem List   Diagnosis Date Noted   ICD (implantable cardioverter-defibrillator) in place 04/24/2023   Polymorphic ventricular tachycardia (HCC) 04/24/2023   Coronary artery disease involving native coronary artery of native heart without angina pectoris 04/24/2023   Acute on chronic combined systolic and diastolic CHF (congestive heart failure) (HCC) 04/23/2023   HFrEF (heart failure with reduced ejection fraction) (HCC) 04/22/2023   Pulmonary HTN (HCC) 04/22/2023   Goals of care, counseling/discussion 04/21/2023   Sepsis secondary to UTI (HCC) 04/12/2023   Transaminitis 04/11/2023   Coronary artery disease due to lipid rich plaque 04/10/2023   Pure hypercholesterolemia 04/10/2023   Metabolic acidosis 04/07/2023   Acute exacerbation of CHF (congestive heart failure) (HCC) 04/05/2023   Acute renal failure (HCC) 03/31/2023   Nausea and vomiting 03/27/2023   Bradycardia 03/27/2023   Debility 02/19/2023   Cardiogenic shock (HCC) 02/14/2023   Chronic HFrEF (heart failure with reduced ejection fraction) (HCC) 02/14/2023   Paroxysmal atrial fibrillation (HCC) 02/14/2023   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/14/2023   Acute kidney injury superimposed on chronic kidney disease (HCC) 02/14/2023   Malnutrition of moderate degree 01/28/2023   NSTEMI (non-ST elevated myocardial infarction) (HCC) 01/10/2023   ACS (acute coronary syndrome) (HCC) 01/10/2023   Ischemic cardiomyopathy 01/10/2023   Nonrheumatic mitral  valve regurgitation 01/10/2023   Vulvar cysts 12/02/2022   Vaginal candida 12/02/2022   AKI (acute kidney injury) (HCC) 11/13/2022   ILD (interstitial lung disease) (HCC) 10/11/2022   Abnormal CT scan, kidney 10/11/2022   Bradycardia on ECG 08/31/2022   Superficial fungal infection of skin 03/20/2022   Onychomycosis of multiple toenails with type 2 diabetes mellitus and peripheral angiopathy (HCC) 11/16/2021   Pain due to onychomycosis of toenail of left foot 11/16/2021   Intermittent claudication of both lower extremities due to atherosclerosis (HCC) 08/22/2021   Ganglion cyst of wrist 08/22/2021   Rash 02/17/2021   On amiodarone therapy 08/21/2020   CKD (chronic kidney disease) stage 3, GFR 30-59 ml/min (HCC) 09/25/2019   Gastroesophageal reflux disease 06/19/2019   Health care maintenance 11/14/2018   Joint pain 08/07/2018   Cough 06/17/2018   Sensorineural hearing loss (SNHL), bilateral 03/20/2018   Complex sleep apnea syndrome 10/05/2017   Chronic heart failure with preserved ejection fraction (HFpEF) (HCC) 09/08/2017   Dilated cardiomyopathy (HCC) 06/30/2017   Frequent PVCs 06/30/2017   Bilateral impacted cerumen 07/15/2016   Advance care planning 08/19/2014   Medicare annual wellness visit, subsequent 07/30/2013   Type II diabetes mellitus with peripheral artery disease (HCC) 05/19/2010   Hyperlipidemia associated with type 2 diabetes mellitus (HCC) 05/19/2010   Essential hypertension 05/19/2010   Ichthyosis 05/19/2010    Palliative Care Assessment & Plan   Patient Profile: Natasha Woodard is a 82 y.o. female with a hx of STEMI in Jan 2024 with PCI, cardiogenic shock, hx freq PVCs thought to have added to prior NSTEMI before 2024, CAD, HTN, HLD, DM-2, PAD, CKD-3, OSA, PAF, VT, and ICD placed 01/2023.    The Palliative medicine team has been asked to get involved in the setting of advanced heart failure to have further goals of care conversations.  Assessment: The  patient seemed more confident in her decision  to go home on the milrinone with palliative care. She states that at first the amount of information presented to her was overwhelming. It was a lot of information and a subject she was unfamiliar with. She tends to need all the information about a subject before being able to make a decision. I told her this was understandable considering the importance of the decision. She has no further questions at this time. I told her PMT would be available as needed as she gets closer to discharge home.  Recommendations/Plan: DNAR/DNI Plan for PICC and dc home with milrinone Outpatient palliative  Goals of Care and Additional Recommendations: Limitations on Scope of Treatment: Full Scope Treatment  Code Status:    Code Status Orders  (From admission, onward)           Start     Ordered   04/21/23 0501  Do not attempt resuscitation (DNR)  Continuous       Question Answer Comment  If patient has no pulse and is not breathing Do Not Attempt Resuscitation   If patient has a pulse and/or is breathing: Medical Treatment Goals LIMITED ADDITIONAL INTERVENTIONS: Use medication/IV fluids and cardiac monitoring as indicated; Do not use intubation or mechanical ventilation (DNI), also provide comfort medications.  Transfer to Progressive/Stepdown as indicated, avoid Intensive Care.   Consent: Discussion documented in EHR or advanced directives reviewed      04/21/23 0502           Code Status History     Date Active Date Inactive Code Status Order ID Comments User Context   04/21/2023 0437 04/21/2023 0502 DNR [id number]  John Giovanni, MD ED   04/05/2023 2121 04/16/2023 2016 DNR [id number]  Kathlene Cote, PA-C ED   04/05/2023 2114 04/05/2023 2121 Full Code [id number]  Kathlene Cote, PA-C ED   03/31/2023 0716 04/03/2023 1557 Full Code [id number]  Russella Dar, NP ED   02/19/2023 1540 02/28/2023 2246 Full Code [id number]  Ranelle Oyster, MD Inpatient    02/19/2023 1540 02/19/2023 1540 Full Code [id number]  Ranelle Oyster, MD Inpatient   01/18/2023 1256 02/19/2023 1530 Full Code [id number]  Sherald Hess, NP Inpatient   01/18/2023 1231 01/18/2023 1256 DNR [id number]  Sherald Hess, NP Inpatient   01/10/2023 1645 01/18/2023 1231 Full Code [id number]  Perlie Gold, PA-C ED   06/30/2017 1456 06/30/2017 2037 Full Code [id number]  Marykay Lex, MD Inpatient      Advance Directive Documentation    Flowsheet Row Most Recent Value  Type of Advance Directive Healthcare Power of Attorney  Pre-existing out of facility DNR order (yellow form or pink MOST form) --  "MOST" Form in Place? --       Prognosis:  Unable to determine  Discharge Planning: To Be Determined  Care plan was discussed with bedside RN  Time spent: 40 minutes  Thank you for allowing the Palliative Medicine Team to assist in the care of this patient.    Sherryll Burger, NP  Please contact Palliative Medicine Team phone at [phone] for questions and concerns.

## 2023-04-27 NOTE — Progress Notes (Signed)
  Progress Note   Patient: Natasha Woodard ZOX:[id number] DOB: December 30, 1940 DOA: 04/20/2023     6 DOS: the patient was seen and examined on 04/27/2023   Brief hospital course:  14 yof w/ significant complex medical history including chronic HFrEF (EF less than 20%), moderate to severe MR, CAD status post PCI 12/2022, hypertension, hyperlipidemia, paroxysmal A-fib on Eliquis, history of VT arrest, status post ICD, CKD stage IIIb, pseudoaneurysm of right groin.  recently admitted 4/24-5/5 for decompensated CHF, AKI, and septic shock secondary to UTI who was discharged on torsemide 40 mg daily seen by PCP 5/9 and sent to the ED due to concern for AKI.    Patient complains of shortness of breath exertions since discharge, does not think she has gotten any worse, has been taking torsemide, feels her lower extremity edema has improved.She is not on RAAS inhibition or beta-blocker due to low output heart failure/risk of hypotension and not on SGLT2 inhibitor due to recent groin infection. In the ED, vitals stable labs showed BUN 42 creatinine 3.2 GFR,BNP 2204 (improved since labs done 2 weeks ago). CXR-not suggestive of pulmonary edema.TRH was called to admit.  05/16 patient continue on milrinone drip and furosemide drip.   Assessment and Plan: Chronic HFrEF (heart failure with reduced ejection fraction) (HCC) Echocardiogram with reduced LV systolic function <20%, mild dilatated LV cavity, RV systolic function preserved, RVSP 72,5 severe dilatation of LA, moderate to severe mitral regurgitation.   Acute on chronic core pulmonale. Pulmonary hypertension.   Urine output recorded at 750 ml.  Systolic blood pressure 110 to 119 mmHg.   Plan to continue inotropic support with milrinone drip (patient will go home with palliative milrinone infusion).  Continue diuresis with furosemide drip increased to 15 mg/hr.   Acute kidney injury superimposed on chronic kidney disease (HCC) CKD stage 3b.   Renal  function with serum cr at 1,89 with K at 3,7 and serum bicarbonate at 37.  Na 142 and Mg 2,0   Plan to continue diuresis with furosemide drip.  Coronary artery disease due to lipid rich plaque No chest pain.   Essential hypertension Continue blood pressure monitoring . Currently on high doses of loop diuretic.   Paroxysmal atrial fibrillation (HCC) Continue anticoagulation.  Rate control with amiodarone.   Patient had Ventricular tachycardia in the past.  Has ICD in place.         Subjective: Patient continue to have edema, dyspnea has improved, decreased mobility.   Physical Exam: Vitals:   04/27/23 0443 04/27/23 0500 04/27/23 0547 04/27/23 0811  BP:    119/69  Pulse: 71  71 69  Resp:    20  Temp:    97.6 F (36.4 C)  TempSrc:    Oral  SpO2: 99%  95% 94%  Weight:  69 kg     Neurology awake and alert ENT with mild pallor Cardiovascular with S1 and S2 present and regular, positive systolic murmur at the apex, no gallops. Positive JVD Positive lower extremity edema +++ Respiratory with no rales or wheezing on anterior auscultation  Abdomen with no distention  Data Reviewed:    Family Communication: no family at the bedside   Disposition: Status is: Inpatient Remains inpatient appropriate because: heart failure   Planned Discharge Destination: Home     Author: Coralie Keens, MD 04/27/2023 2:51 PM  For on call review [email or web address].

## 2023-04-27 NOTE — Progress Notes (Addendum)
Advanced Heart Failure Rounding Note  PCP-Cardiologist: Bryan Lemma, MD   Subjective:    Yesterday placed on lasix drip. I.O not accurate. Weight down a couple of pounds.   Remains on milrinone 0. 25 mcg+ lasix drip at 12 mg per hour. CO-OX  70%.   Lab Results  Component Value Date   CREATININE 1.89 (H) 04/27/2023   CREATININE 2.01 (H) 04/26/2023   CREATININE 2.03 (H) 04/25/2023    Denies SOB.    Objective:   Weight Range: 69 kg Body mass index is 27.84 kg/m.   Vital Signs:   Temp:  [97.5 F (36.4 C)-98.6 F (37 C)] 97.6 F (36.4 C) (05/16 0811) Pulse Rate:  [69-87] 69 (05/16 0811) Resp:  [18-20] 20 (05/16 0811) BP: (103-119)/(69-86) 119/69 (05/16 0811) SpO2:  [94 %-100 %] 94 % (05/16 0811) Weight:  [69 kg] 69 kg (05/16 0500) Last BM Date : 04/26/23  Weight change: Filed Weights   04/25/23 0622 04/26/23 0820 04/27/23 0500  Weight: 70.7 kg 70.1 kg 69 kg    Intake/Output:   Intake/Output Summary (Last 24 hours) at 04/27/2023 0936 Last data filed at 04/27/2023 0723 Gross per 24 hour  Intake 731.06 ml  Output 750 ml  Net -18.94 ml     CVP 17-18 sitting in the chair.  Physical Exam   General:  Sitting on the side of the bed. . No resp difficulty HEENT: normal Neck: supple. JVP to jaw . Carotids 2+ bilat; no bruits. No lymphadenopathy or thryomegaly appreciated. Cor: PMI nondisplaced. Regular rate & rhythm. No rubs, gallops or murmurs. Lungs: clear Abdomen: soft, nontender, nondistended. No hepatosplenomegaly. No bruits or masses. Good bowel sounds. Extremities: no cyanosis, clubbing, rash, R and LLE 1+edema. RUE PICC  Neuro: alert & orientedx3, cranial nerves grossly intact. moves all 4 extremities w/o difficulty. Affect pleasant  Telemetry   SR 70-80s  EKG    N/A   Labs    CBC Recent Labs    04/25/23 0255 04/27/23 0445  WBC 3.1* 3.4*  HGB 10.5* 10.9*  HCT 34.6* 36.1  MCV 91.8 92.1  PLT 245 240   Basic Metabolic Panel Recent Labs     04/25/23 1931 04/26/23 0555 04/27/23 0445  NA 144 144 142  K 4.4 3.6 3.7  CL 97* 98 95*  CO2 33* 35* 37*  GLUCOSE 89 131* 168*  BUN 37* 34* 32*  CREATININE 2.03* 2.01* 1.89*  CALCIUM 9.7 9.4 9.7  MG  --  2.2 2.0  PHOS 2.5  --   --    Liver Function Tests Recent Labs    04/25/23 1931  ALBUMIN 3.5   No results for input(s): "LIPASE", "AMYLASE" in the last 72 hours. Cardiac Enzymes No results for input(s): "CKTOTAL", "CKMB", "CKMBINDEX", "TROPONINI" in the last 72 hours.  BNP: BNP (last 3 results) Recent Labs    04/05/23 1820 04/06/23 0058 04/20/23 1758  BNP 2,550.1* 2,843.8* 2,204.9*    ProBNP (last 3 results) Recent Labs    06/09/22 1506  PROBNP 4,638*     D-Dimer No results for input(s): "DDIMER" in the last 72 hours. Hemoglobin A1C No results for input(s): "HGBA1C" in the last 72 hours. Fasting Lipid Panel No results for input(s): "CHOL", "HDL", "LDLCALC", "TRIG", "CHOLHDL", "LDLDIRECT" in the last 72 hours. Thyroid Function Tests No results for input(s): "TSH", "T4TOTAL", "T3FREE", "THYROIDAB" in the last 72 hours.  Invalid input(s): "FREET3"  Other results:   Imaging    No results found.   Medications:  Scheduled Medications:  amiodarone  200 mg Oral Daily   apixaban  2.5 mg Oral BID   clopidogrel  75 mg Oral Daily   insulin aspart  0-9 Units Subcutaneous TID WC   polyethylene glycol  17 g Oral BID   rosuvastatin  10 mg Oral Daily   senna-docusate  2 tablet Oral BID   sodium chloride flush  10-40 mL Intracatheter Q12H   sodium chloride flush  10-40 mL Intracatheter Q12H   traMADol  50 mg Oral BID    Infusions:  furosemide (LASIX) 200 mg in dextrose 5 % 100 mL (2 mg/mL) infusion 12 mg/hr (04/27/23 0723)   milrinone 0.25 mcg/kg/min (04/27/23 0723)    PRN Medications: acetaminophen **OR** acetaminophen, bisacodyl, sodium chloride flush, sodium chloride flush    Patient Profile   Natasha Woodard is a 82 y.o. female  with a hx of STEMI in Jan 2024 with PCI, cardiogenic shock, hx freq PVCs thought to have added to prior NSTEMI before 2024, CAD, HTN, HLD, DM-2, PAD, CKD-3, OSA, PAF, and ICD placed 01/2023.    Admitted with recurrent AKI/A/C biventricular HF   Assessment/Plan  1. A/C Biventricular HF, suspect mixed ischemic/nonischemic cardiomyopathy.   Echo in 2/23 with EF 50-55%.  Patient has history of nonischemic cardiomyopathy (?PVC-mediated) that improved. In February 2024 admitted with acute MI and EF was 20-25%. During that admit she had VT arrest and had [employer] ICD placed.  Echo repeated April 2024 that showed EF had gone back down to < 20% and RV moderately reduced.  Struggling over the last month. Admitted with recurrent low output heart failure. Milrinone initiated. Renal function improving and low output symptoms resolved (n/v).  -She has PICC. On milrinone 0.25 mcg. CO-OX 70%.  - CVP 17-18. Continue lasix drip.   -Will need to watch on oral diuretics before d/c.  - Will need home milrinone for palliative measures. This has been arranged.  -GDMT limited by AKI.    2. AKI  -Creatinine baseline ~ 1.8  -Creatinine on admit 3.2 --> today 1.9   3. CAD H/O STEMI 12/2022 with occluded pLOM treated with DES  -On plavix, statin, eliquis.  -No chest pain.    4. PAF -In SR. On amio 200 mg daily  - On eliqis 2.5 mg twice a day.    5. H/O VT  -ICD placed earlier this year.  -Continue amio 200 mg daily    6. GOC, DNR  Discussed home inotropes for palliation and that there is no way to tell how long this will help. Usually less than 6 months Jeri Modena aware. TOC CM following.  Palliative Care following and had family meeting . Plan home with Palliative Care/HH .     Length of Stay: 6  Amy Clegg, NP  04/27/2023, 9:36 AM  Advanced Heart Failure Team Pager [phone] (M-F; 7a - 5p)  Please contact [facility] for night-coverage after hours (5p -7a ) and weekends on  [email or web address]  Patient seen with NP, agree with the above note.    Weight down 2 lbs, I/Os not accurate.  She remains on milrinone 0.25 and Lasix gtt 12 mg/hr.  CVP still 17-18.   No complaints.  Creatinine lower at 1.89.   General: NAD Neck: JVP 16 cm, no thyromegaly or thyroid nodule.  Lungs: Clear to auscultation bilaterally with normal respiratory effort. CV: Nondisplaced PMI.  Heart regular S1/S2, no S3/S4, no murmur.  1+ edema to knees.  Abdomen: Soft, nontender, no hepatosplenomegaly, no  distention.  Skin: Intact without lesions or rashes.  Neurologic: Alert and oriented x 3.  Psych: Normal affect. Extremities: No clubbing or cyanosis.  HEENT: Normal.   1. Acute on chronic systolic CHF/cardiogenic shock: Echo 4/24 with EF < 20%, moderate RV dysfunction, moderate-severe infarct-related MR, IVC dilated. Lactate elevated, AKI.  Suspect low output HF.  She has been started on milrinone 0.25, co-ox 74% today.  She remains quite volume overloaded, CVP 17-18, creatinine lower at 1.89.  - Increase Lasix to 15 mg/hr and will give metolazone 2.5 x 1 with KCl.  - Continue milrinone 0.25.  With multiple recent admissions with low output HF, we are going to continue palliative home milrinone.  - GDMT limited by AKI.  - She is not a candidate for transplant (age) or LVAD (age, elevated creatinine, frailty).  - We discussed hospice care when she goes home.  For now, she wants palliative care followup but not hospice.  2. AKI on CKD stage 3: Suspect cardiorenal syndrome.  Creatinine stable on milrinone at 1.89.  3. Mitral regurgitation: Infarct-related MR.  Moderate on 2/24 TEE, mod-severe on 4/24 echo.  Manage medically.  4. CAD: STEMI 1/24 with PLOM treated with DES.  - Continue Plavix, no ASA given apixaban use.  - Continue statin.  5. Atrial fibrillation: Paroxysmal.  In NSR currently.  - Continue amiodarone 200 daily.  - Continue Eliquis, dosed 2.5 bid for age > 80 and creatinine > 1.5.  6.  VT: Has [employer] ICD and on amiodarone.   Marca Ancona 04/27/2023 1:00 PM ]

## 2023-04-27 NOTE — Progress Notes (Signed)
Physical Therapy Treatment Patient Details Name: Natasha Woodard MRN: [id number] DOB: September 24, 1941 Today's Date: 04/27/2023   History of Present Illness 82 y.o. female presents to [facility] hospital on 04/20/2023, referred by PCP due to concern fro AKI. Pt was recently admitted 4/24-5/5 for decompensated CHF, AKI, sepsis 2/2 UTI. PMH: NSTEMI and cardiogenic shock with a complicated and prolonged admission 1/30-3/10/24,  HFrEF, A-fib, AKI on CKD IVOSA, HTN, HLD    PT Comments    Pt doing well with mobility. Less dyspnea. Ready for dc home from PT standpoint when medically ready.    Recommendations for follow up therapy are one component of a multi-disciplinary discharge planning process, led by the attending physician.  Recommendations may be updated based on patient status, additional functional criteria and insurance authorization.  Follow Up Recommendations       Assistance Recommended at Discharge Intermittent Supervision/Assistance  Patient can return home with the following A little help with bathing/dressing/bathroom;Assistance with cooking/housework;Assist for transportation;Help with stairs or ramp for entrance   Equipment Recommendations  None recommended by PT    Recommendations for Other Services       Precautions / Restrictions Precautions Precautions: Fall;Other (comment) Precaution Comments: monitor SpO2 Restrictions Weight Bearing Restrictions: No     Mobility  Bed Mobility Overal bed mobility: Modified Independent                  Transfers Overall transfer level: Modified independent Equipment used: Rollator (4 wheels) Transfers: Sit to/from Stand                  Ambulation/Gait Ambulation/Gait assistance: Supervision Gait Distance (Feet): 350 Feet Assistive device: Rollator (4 wheels) Gait Pattern/deviations: Step-through pattern, Decreased stride length Gait velocity: Decreased Gait velocity interpretation: 1.31 - 2.62 ft/sec, indicative of  limited community ambulator   General Gait Details: supervision for safety/lines. Pt with 1 standing rest breaks.   Stairs             Wheelchair Mobility    Modified Rankin (Stroke Patients Only)       Balance Overall balance assessment: Needs assistance Sitting-balance support: No upper extremity supported, Feet supported Sitting balance-Leahy Scale: Good     Standing balance support: No upper extremity supported, During functional activity Standing balance-Leahy Scale: Fair                              Cognition Arousal/Alertness: Awake/alert Behavior During Therapy: WFL for tasks assessed/performed Overall Cognitive Status: No family/caregiver present to determine baseline cognitive functioning                                 General Comments: pt is HOH,        Exercises      General Comments General comments (skin integrity, edema, etc.): SpO2 95% after amb on RA. Dyspnea 2/4      Pertinent Vitals/Pain Pain Assessment Pain Assessment: No/denies pain    Home Living                          Prior Function            PT Goals (current goals can now be found in the care plan section) Progress towards PT goals: Progressing toward goals    Frequency    Min 2X/week      PT Plan Current  plan remains appropriate    Co-evaluation              AM-PAC PT "6 Clicks" Mobility   Outcome Measure  Help needed turning from your back to your side while in a flat bed without using bedrails?: None Help needed moving from lying on your back to sitting on the side of a flat bed without using bedrails?: None Help needed moving to and from a bed to a chair (including a wheelchair)?: None Help needed standing up from a chair using your arms (e.g., wheelchair or bedside chair)?: None Help needed to walk in hospital room?: A Little Help needed climbing 3-5 steps with a railing? : A Little 6 Click Score: 22    End  of Session   Activity Tolerance: Patient tolerated treatment well Patient left: in chair;with call bell/phone within reach;with nursing/sitter in room Nurse Communication: Mobility status PT Visit Diagnosis: Other abnormalities of gait and mobility (R26.89)     Time: 1914-7829 PT Time Calculation (min) (ACUTE ONLY): 25 min  Charges:  $Gait Training: 23-37 mins                     [facility] PT Acute Rehabilitation Services Office [phone]    Angelina Ok [facility] 04/27/2023, 4:36 PM

## 2023-04-28 ENCOUNTER — Ambulatory Visit: Payer: Medicare Other

## 2023-04-28 DIAGNOSIS — I2510 Atherosclerotic heart disease of native coronary artery without angina pectoris: Secondary | ICD-10-CM | POA: Diagnosis not present

## 2023-04-28 DIAGNOSIS — N179 Acute kidney failure, unspecified: Secondary | ICD-10-CM | POA: Diagnosis not present

## 2023-04-28 DIAGNOSIS — I5022 Chronic systolic (congestive) heart failure: Secondary | ICD-10-CM | POA: Diagnosis not present

## 2023-04-28 DIAGNOSIS — I10 Essential (primary) hypertension: Secondary | ICD-10-CM | POA: Diagnosis not present

## 2023-04-28 DIAGNOSIS — I5043 Acute on chronic combined systolic (congestive) and diastolic (congestive) heart failure: Secondary | ICD-10-CM | POA: Diagnosis not present

## 2023-04-28 LAB — COOXEMETRY PANEL
Carboxyhemoglobin: 0.5 % (ref 0.5–1.5)
Carboxyhemoglobin: 1.8 % — ABNORMAL HIGH (ref 0.5–1.5)
Methemoglobin: 0.9 % (ref 0.0–1.5)
Methemoglobin: 0.8 % (ref 0.0–1.5)
O2 Saturation: 39.9 %
O2 Saturation: 64.2 %
Total hemoglobin: 11.8 g/dL — ABNORMAL LOW (ref 12.0–16.0)
Total hemoglobin: 12.1 g/dL (ref 12.0–16.0)

## 2023-04-28 LAB — BASIC METABOLIC PANEL
Anion gap: 13 (ref 5–15)
BUN: 29 mg/dL — ABNORMAL HIGH (ref 8–23)
CO2: 37 mmol/L — ABNORMAL HIGH (ref 22–32)
Calcium: 9.7 mg/dL (ref 8.9–10.3)
Chloride: 91 mmol/L — ABNORMAL LOW (ref 98–111)
Creatinine, Ser: 2 mg/dL — ABNORMAL HIGH (ref 0.44–1.00)
GFR, Estimated: 24 mL/min — ABNORMAL LOW (ref >60)
Glucose, Bld: 116 mg/dL — ABNORMAL HIGH (ref 70–99)
Potassium: 4.2 mmol/L (ref 3.5–5.1)
Sodium: 141 mmol/L (ref 135–145)

## 2023-04-28 LAB — CBC
HCT: 39.1 % (ref 36.0–46.0)
Hemoglobin: 11.8 g/dL — ABNORMAL LOW (ref 12.0–15.0)
MCH: 27.8 pg (ref 26.0–34.0)
MCHC: 30.2 g/dL (ref 30.0–36.0)
MCV: 92.2 fL (ref 80.0–100.0)
Platelets: 259 10*3/uL (ref 150–400)
RBC: 4.24 MIL/uL (ref 3.87–5.11)
RDW: 16.2 % — ABNORMAL HIGH (ref 11.5–15.5)
WBC: 4.3 10*3/uL (ref 4.0–10.5)
nRBC: 0 % (ref 0.0–0.2)

## 2023-04-28 LAB — GLUCOSE, CAPILLARY
Glucose-Capillary: 138 mg/dL — ABNORMAL HIGH (ref 70–99)
Glucose-Capillary: 229 mg/dL — ABNORMAL HIGH (ref 70–99)
Glucose-Capillary: 70 mg/dL (ref 70–99)
Glucose-Capillary: 170 mg/dL — ABNORMAL HIGH (ref 70–99)

## 2023-04-28 MED ORDER — TORSEMIDE 20 MG PO TABS
40.0000 mg | ORAL_TABLET | Freq: Two times a day (BID) | ORAL | Status: DC
  Administered 2023-04-28 – 2023-05-01 (×6): 40 mg via ORAL
  Filled 2023-04-28 (×6): qty 2

## 2023-04-28 NOTE — Progress Notes (Addendum)
Advanced Heart Failure Rounding Note  PCP-Cardiologist: Natasha Lemma, MD   Subjective:    On Milrinone 0.25. Early AM co-ox resulted low at 39% (74% yesterday). Repeat Co-ox pending.   On Lasix gtt at 15/hr . 1.3L in UOP. Wt down 3 lb.   Scr back up, 2.01>>1.89>>2.00 today. CO2 37   I'm unable to get CVP to work but Charity fundraiser got 9 earlier this morning.   She overall feels better. No resting dyspnea but c/w DOE. In good spirits this morning.   He had a run of VT this morning and was paced out.  K 4.2   Objective:   Weight Range: 68 kg Body mass index is 27.42 kg/m.   Vital Signs:   Temp:  [97.6 F (36.4 C)-97.8 F (36.6 C)] 97.6 F (36.4 C) (05/17 0800) Pulse Rate:  [69-84] 70 (05/17 0800) Resp:  [18-22] 21 (05/17 0800) BP: (96-122)/(58-73) 111/73 (05/17 0800) SpO2:  [92 %-100 %] 94 % (05/17 0800) Weight:  [68 kg] 68 kg (05/17 0441) Last BM Date : 04/26/23  Weight change: Filed Weights   04/27/23 0500 04/28/23 0044 04/28/23 0441  Weight: 69 kg 68 kg 68 kg    Intake/Output:   Intake/Output Summary (Last 24 hours) at 04/28/2023 0950 Last data filed at 04/28/2023 0439 Gross per 24 hour  Intake 257.45 ml  Output 1550 ml  Net -1292.55 ml     Physical Exam   General:  elderly chronically ill appearing appearing. No respiratory difficulty HEENT: normal Neck: supple. JVD ~10 cm. Carotids 2+ bilat; no bruits. No lymphadenopathy or thyromegaly appreciated. Cor: PMI nondisplaced. Regular rate & rhythm. 2/6 MR murmur  Lungs: decrease BS at the bases bilaterally  Abdomen: soft, nontender, nondistended. No hepatosplenomegaly. No bruits or masses. Good bowel sounds. Extremities: no cyanosis, clubbing, rash, trace -1+ b/l LE edema + RUE PICC  Neuro: alert & oriented x 3, cranial nerves grossly intact. moves all 4 extremities w/o difficulty. Affect pleasant. t  Telemetry   Apaced 70s   EKG    N/A   Labs    CBC Recent Labs    04/27/23 0445  WBC 3.4*  HGB  10.9*  HCT 36.1  MCV 92.1  PLT 240   Basic Metabolic Panel Recent Labs    78/29/56 1931 04/26/23 0555 04/27/23 0445 04/28/23 0545  NA 144 144 142 141  K 4.4 3.6 3.7 4.2  CL 97* 98 95* 91*  CO2 33* 35* 37* 37*  GLUCOSE 89 131* 168* 116*  BUN 37* 34* 32* 29*  CREATININE 2.03* 2.01* 1.89* 2.00*  CALCIUM 9.7 9.4 9.7 9.7  MG  --  2.2 2.0  --   PHOS 2.5  --   --   --    Liver Function Tests Recent Labs    04/25/23 1931  ALBUMIN 3.5   No results for input(s): "LIPASE", "AMYLASE" in the last 72 hours. Cardiac Enzymes No results for input(s): "CKTOTAL", "CKMB", "CKMBINDEX", "TROPONINI" in the last 72 hours.  BNP: BNP (last 3 results) Recent Labs    04/05/23 1820 04/06/23 0058 04/20/23 1758  BNP 2,550.1* 2,843.8* 2,204.9*    ProBNP (last 3 results) Recent Labs    06/09/22 1506  PROBNP 4,638*     D-Dimer No results for input(s): "DDIMER" in the last 72 hours. Hemoglobin A1C No results for input(s): "HGBA1C" in the last 72 hours. Fasting Lipid Panel No results for input(s): "CHOL", "HDL", "LDLCALC", "TRIG", "CHOLHDL", "LDLDIRECT" in the last 72 hours. Thyroid Function  Tests No results for input(s): "TSH", "T4TOTAL", "T3FREE", "THYROIDAB" in the last 72 hours.  Invalid input(s): "FREET3"  Other results:   Imaging    No results found.   Medications:     Scheduled Medications:  amiodarone  200 mg Oral Daily   apixaban  2.5 mg Oral BID   clopidogrel  75 mg Oral Daily   insulin aspart  0-9 Units Subcutaneous TID WC   polyethylene glycol  17 g Oral BID   rosuvastatin  10 mg Oral Daily   senna-docusate  2 tablet Oral BID   sodium chloride flush  10-40 mL Intracatheter Q12H   sodium chloride flush  10-40 mL Intracatheter Q12H   traMADol  50 mg Oral BID    Infusions:  furosemide (LASIX) 200 mg in dextrose 5 % 100 mL (2 mg/mL) infusion 15 mg/hr (04/28/23 0529)   milrinone 0.25 mcg/kg/min (04/28/23 0439)    PRN Medications: acetaminophen **OR**  acetaminophen, bisacodyl, sodium chloride flush, sodium chloride flush    Patient Profile   Natasha Woodard is a 82 y.o. female with a hx of STEMI in Jan 2024 with PCI, cardiogenic shock, hx freq PVCs thought to have added to prior NSTEMI before 2024, CAD, HTN, HLD, DM-2, PAD, CKD-3, OSA, PAF, and ICD placed 01/2023.    Admitted with recurrent AKI/A/C biventricular HF   Assessment/Plan   1. Acute on chronic systolic CHF/cardiogenic shock: Echo 4/24 with EF < 20%, moderate RV dysfunction, moderate-severe infarct-related MR, IVC dilated. Lactate elevated, AKI.  Suspect low output HF.  She has been started on milrinone 0.25, Early AM co-ox resulted low at 39% (74% yesterday). Repeat Co-ox pending. On Lasix gtt at 15/hr . 1.3L in UOP. Wt down 3 lb. CVP ~9 per RN.  - Continue Lasix gtt at 15/hr. Likely transition to torsemide tomorrow  - Continue milrinone 0.25.  With multiple recent admissions with low output HF, we are going to continue palliative home milrinone.  - GDMT limited by AKI.  - She is not a candidate for transplant (age) or LVAD (age, elevated creatinine, frailty).  - We discussed hospice care when she goes home.  For now, she wants palliative care followup but not hospice.  2. AKI on CKD stage 3: Suspect cardiorenal syndrome.  Scr back up today from 1.8>>2.0. Early AM co-ox was low, checking repeat for confirmation. My need further titration of milrinone - follow BMP  3. Mitral regurgitation: Infarct-related MR.  Moderate on 2/24 TEE, mod-severe on 4/24 echo.  Manage medically.  4. CAD: STEMI 1/24 with PLOM treated with DES.  - Continue Plavix, no ASA given apixaban use.  - Continue statin.  5. Atrial fibrillation: Paroxysmal.  In NSR currently.  - Continue amiodarone 200 daily.  - Continue Eliquis, dosed 2.5 bid for age > 80 and creatinine > 1.5.  6. VT: Has [employer] ICD and on amiodarone.   .  Length of Stay: [address], PA-C  04/28/2023, 9:50  AM  Advanced Heart Failure Team Pager [phone] (M-F; 7a - 5p)  Please contact [facility] for night-coverage after hours (5p -7a ) and weekends on [email or web address]  Patient seen with PA, agree with the above note.   CVP coming down, 12 today.  Weight down 3 lbs, I/Os not accurate.   Feels good, reading newspaper.   General: NAD Neck: JVP 10 cm, no thyromegaly or thyroid nodule.  Lungs: Clear to auscultation bilaterally with normal respiratory effort. CV: Nondisplaced PMI.  Heart regular S1/S2, no  S3/S4, no murmur.  1+ edema to knees.  Abdomen: Soft, nontender, no hepatosplenomegaly, no distention.  Skin: Intact without lesions or rashes.  Neurologic: Alert and oriented x 3.  Psych: Normal affect. Extremities: No clubbing or cyanosis.  HEENT: Normal.   1. Acute on chronic systolic CHF/cardiogenic shock: Echo 4/24 with EF < 20%, moderate RV dysfunction, moderate-severe infarct-related MR, IVC dilated. Lactate elevated, AKI.  Suspect low output HF.  She has been started on milrinone 0.25, early am co-ox much lower at 40%.  She feels fine.  CVP down to 12 today, creatinine mildly higher at 2.0.   - Stop Lasix today and start torsemide 40 mg po bid.  - Continue milrinone 0.25.  With multiple recent admissions with low output HF, we are going to continue palliative home milrinone.  - Repeat co-ox now.  - GDMT limited by AKI.  - She is not a candidate for transplant (age) or LVAD (age, elevated creatinine, frailty).  - We discussed hospice care when she goes home.  For now, she wants palliative care followup but not hospice.  2. AKI on CKD stage 3: Suspect cardiorenal syndrome.  Creatinine mildly higher, 1.89 => 2.0.  3. Mitral regurgitation: Infarct-related MR.  Moderate on 2/24 TEE, mod-severe on 4/24 echo.  Manage medically.  4. CAD: STEMI 1/24 with PLOM treated with DES.  - Continue Plavix, no ASA given apixaban use.  - Continue statin.  5. Atrial fibrillation: Paroxysmal.  In NSR  currently.  - Continue amiodarone 200 daily.  - Continue Eliquis, dosed 2.5 bid for age > 80 and creatinine > 1.5.  6. VT: Has [employer] ICD and on amiodarone.   Marca Ancona 04/28/2023 12:29 PM

## 2023-04-28 NOTE — Progress Notes (Signed)
  Progress Note   Patient: Natasha Woodard:[id number] DOB: 1941/11/21 DOA: 04/20/2023     7 DOS: the patient was seen and examined on 04/28/2023   Brief hospital course:  68 yof w/ significant complex medical history including chronic HFrEF (EF less than 20%), moderate to severe MR, CAD status post PCI 12/2022, hypertension, hyperlipidemia, paroxysmal A-fib on Eliquis, history of VT arrest, status post ICD, CKD stage IIIb, pseudoaneurysm of right groin.  recently admitted 4/24-5/5 for decompensated CHF, AKI, and septic shock secondary to UTI who was discharged on torsemide 40 mg daily seen by PCP 5/9 and sent to the ED due to concern for AKI.    Patient complains of shortness of breath exertions since discharge, does not think she has gotten any worse, has been taking torsemide, feels her lower extremity edema has improved.She is not on RAAS inhibition or beta-blocker due to low output heart failure/risk of hypotension and not on SGLT2 inhibitor due to recent groin infection. In the ED, vitals stable labs showed BUN 42 creatinine 3.2 GFR,BNP 2204 (improved since labs done 2 weeks ago). CXR-not suggestive of pulmonary edema.TRH was called to admit.  05/16 patient continue on milrinone drip and furosemide drip.  05/17 volume status is improving.   Assessment and Plan: Chronic HFrEF (heart failure with reduced ejection fraction) (HCC) Echocardiogram with reduced LV systolic function <20%, mild dilatated LV cavity, RV systolic function preserved, RVSP 72,5 severe dilatation of LA, moderate to severe mitral regurgitation.   Acute on chronic core pulmonale. Pulmonary hypertension.   Urine output recorded at 1,550 ml.  Systolic blood pressure 111 to 107 mmHg.  SV02 64.2 Plan to continue inotropic support with milrinone drip (patient will go home with palliative milrinone infusion).  Transitioned to torsemide 40 mg po bid.   Acute kidney injury superimposed on chronic kidney disease (HCC) CKD  stage 3b.   Stable renal function with serum cr at 2,0 with K at 4,2 and serum bicarbonate at 37. Na 141   Plan to continue diuresis with torsemide. Follow up renal function in am.   Coronary artery disease due to lipid rich plaque No chest pain.   Essential hypertension Continue blood pressure monitoring . Milrinone for inotropic support.   Paroxysmal atrial fibrillation (HCC) Continue anticoagulation.  Rate control with amiodarone.   Patient had Ventricular tachycardia in the past.  Has ICD in place.         Subjective: Patient is feeling better, dyspnea and edema have improved, she has been ambulating, in the hallway   Physical Exam: Vitals:   04/28/23 0044 04/28/23 0346 04/28/23 0441 04/28/23 0800  BP:  107/67  111/73  Pulse:  71  70  Resp:  18  (!) 21  Temp:  97.8 F (36.6 C)  97.6 F (36.4 C)  TempSrc:  Oral  Oral  SpO2:  98%  94%  Weight: 68 kg  68 kg    Neurology awake and alert ENT with mild pallor Cardiovascular with S1 and S2 present and rhythmic, no gallops or rubs Mild JVD Positive lower extremity edema + Respiratory with no rales or wheezing Abdomen with no distention  Data Reviewed:    Family Communication: her grandson is at the bedside   Disposition: Status is: Inpatient Remains inpatient appropriate because: heart failure   Planned Discharge Destination: Home     Author: Coralie Keens, MD 04/28/2023 2:57 PM  For on call review [email or web address].

## 2023-04-28 NOTE — Progress Notes (Signed)
Mobility Specialist Progress Note:   04/28/23 1430  Mobility  Activity Ambulated with assistance in hallway  Level of Assistance Standby assist, set-up cues, supervision of patient - no hands on  Assistive Device Four wheel walker  Distance Ambulated (ft) 450 ft  Activity Response Tolerated well  Mobility Referral Yes  $Mobility charge 1 Mobility  Mobility Specialist Start Time (ACUTE ONLY) 1430  Mobility Specialist Stop Time (ACUTE ONLY) 1455  Mobility Specialist Time Calculation (min) (ACUTE ONLY) 25 min   Pt agreeable to mobility session. Required only supervision for safety. No c/o throughout ambulation. Pt back in chair with all needs met, family member present.   Addison Lank Mobility Specialist Please contact via SecureChat or  Rehab office at [phone]

## 2023-04-29 DIAGNOSIS — N179 Acute kidney failure, unspecified: Secondary | ICD-10-CM | POA: Diagnosis not present

## 2023-04-29 DIAGNOSIS — I5043 Acute on chronic combined systolic (congestive) and diastolic (congestive) heart failure: Secondary | ICD-10-CM | POA: Diagnosis not present

## 2023-04-29 DIAGNOSIS — I2510 Atherosclerotic heart disease of native coronary artery without angina pectoris: Secondary | ICD-10-CM | POA: Diagnosis not present

## 2023-04-29 DIAGNOSIS — I5022 Chronic systolic (congestive) heart failure: Secondary | ICD-10-CM | POA: Diagnosis not present

## 2023-04-29 DIAGNOSIS — I10 Essential (primary) hypertension: Secondary | ICD-10-CM | POA: Diagnosis not present

## 2023-04-29 LAB — GLUCOSE, CAPILLARY
Glucose-Capillary: 116 mg/dL — ABNORMAL HIGH (ref 70–99)
Glucose-Capillary: 226 mg/dL — ABNORMAL HIGH (ref 70–99)
Glucose-Capillary: 163 mg/dL — ABNORMAL HIGH (ref 70–99)
Glucose-Capillary: 103 mg/dL — ABNORMAL HIGH (ref 70–99)

## 2023-04-29 LAB — COOXEMETRY PANEL
Carboxyhemoglobin: 2.1 % — ABNORMAL HIGH (ref 0.5–1.5)
Methemoglobin: <0.7 % (ref 0.0–1.5)
O2 Saturation: 71.4 %
Total hemoglobin: 11.3 g/dL — ABNORMAL LOW (ref 12.0–16.0)

## 2023-04-29 LAB — BASIC METABOLIC PANEL
Anion gap: 14 (ref 5–15)
BUN: 29 mg/dL — ABNORMAL HIGH (ref 8–23)
CO2: 37 mmol/L — ABNORMAL HIGH (ref 22–32)
Calcium: 9.4 mg/dL (ref 8.9–10.3)
Chloride: 88 mmol/L — ABNORMAL LOW (ref 98–111)
Creatinine, Ser: 2.12 mg/dL — ABNORMAL HIGH (ref 0.44–1.00)
GFR, Estimated: 23 mL/min — ABNORMAL LOW (ref >60)
Glucose, Bld: 261 mg/dL — ABNORMAL HIGH (ref 70–99)
Potassium: 3.2 mmol/L — ABNORMAL LOW (ref 3.5–5.1)
Sodium: 139 mmol/L (ref 135–145)

## 2023-04-29 NOTE — Progress Notes (Signed)
Patient ID: Natasha Woodard, female   DOB: March 31, 1941, 82 y.o.   MRN: [id number]     Advanced Heart Failure Rounding Note  PCP-Cardiologist: Bryan Lemma, MD   Subjective:    On Milrinone 0.25 mcg/kg/min.  No co-ox today.  No BMET today.  No weight today.  I/Os have proved impossible.  However, CVP is good at 6.   She feels good today, walked without dyspnea in hall with walker yesterday.   Objective:   Weight Range: 68 kg Body mass index is 27.42 kg/m.   Vital Signs:   Temp:  [97.6 F (36.4 C)-98.4 F (36.9 C)] 98.4 F (36.9 C) (05/17 2044) Pulse Rate:  [70-72] 72 (05/18 0849) Resp:  [18-20] 19 (05/18 0849) BP: (93-113)/(56-66) 113/61 (05/18 0849) SpO2:  [94 %-98 %] 94 % (05/18 0849) Last BM Date : 04/28/23  Weight change: Filed Weights   04/27/23 0500 04/28/23 0044 04/28/23 0441  Weight: 69 kg 68 kg 68 kg    Intake/Output:   Intake/Output Summary (Last 24 hours) at 04/29/2023 1100 Last data filed at 04/29/2023 0900 Gross per 24 hour  Intake 240 ml  Output --  Net 240 ml     Physical Exam   General: NAD Neck: No JVD, no thyromegaly or thyroid nodule.  Lungs: Clear to auscultation bilaterally with normal respiratory effort. CV: Nondisplaced PMI.  Heart regular S1/S2, no S3/S4, no murmur.  No peripheral edema.   Abdomen: Soft, nontender, no hepatosplenomegaly, no distention.  Skin: Intact without lesions or rashes.  Neurologic: Alert and oriented x 3.  Psych: Normal affect. Extremities: No clubbing or cyanosis.  HEENT: Normal.    Telemetry   A-paced 70s (personally reviewed)  EKG    N/A   Labs    CBC Recent Labs    04/27/23 0445 04/28/23 0545  WBC 3.4* 4.3  HGB 10.9* 11.8*  HCT 36.1 39.1  MCV 92.1 92.2  PLT 240 259   Basic Metabolic Panel Recent Labs    40/98/11 0445 04/28/23 0545  NA 142 141  K 3.7 4.2  CL 95* 91*  CO2 37* 37*  GLUCOSE 168* 116*  BUN 32* 29*  CREATININE 1.89* 2.00*  CALCIUM 9.7 9.7  MG 2.0  --    Liver  Function Tests No results for input(s): "AST", "ALT", "ALKPHOS", "BILITOT", "PROT", "ALBUMIN" in the last 72 hours.  No results for input(s): "LIPASE", "AMYLASE" in the last 72 hours. Cardiac Enzymes No results for input(s): "CKTOTAL", "CKMB", "CKMBINDEX", "TROPONINI" in the last 72 hours.  BNP: BNP (last 3 results) Recent Labs    04/05/23 1820 04/06/23 0058 04/20/23 1758  BNP 2,550.1* 2,843.8* 2,204.9*    ProBNP (last 3 results) Recent Labs    06/09/22 1506  PROBNP 4,638*     D-Dimer No results for input(s): "DDIMER" in the last 72 hours. Hemoglobin A1C No results for input(s): "HGBA1C" in the last 72 hours. Fasting Lipid Panel No results for input(s): "CHOL", "HDL", "LDLCALC", "TRIG", "CHOLHDL", "LDLDIRECT" in the last 72 hours. Thyroid Function Tests No results for input(s): "TSH", "T4TOTAL", "T3FREE", "THYROIDAB" in the last 72 hours.  Invalid input(s): "FREET3"  Other results:   Imaging    No results found.   Medications:     Scheduled Medications:  amiodarone  200 mg Oral Daily   apixaban  2.5 mg Oral BID   clopidogrel  75 mg Oral Daily   insulin aspart  0-9 Units Subcutaneous TID WC   polyethylene glycol  17 g Oral BID  rosuvastatin  10 mg Oral Daily   senna-docusate  2 tablet Oral BID   sodium chloride flush  10-40 mL Intracatheter Q12H   sodium chloride flush  10-40 mL Intracatheter Q12H   torsemide  40 mg Oral BID   traMADol  50 mg Oral BID    Infusions:  milrinone 0.25 mcg/kg/min (04/28/23 2222)    PRN Medications: acetaminophen **OR** acetaminophen, bisacodyl, sodium chloride flush, sodium chloride flush    Patient Profile   Natasha Woodard is a 82 y.o. female with a hx of STEMI in Jan 2024 with PCI, cardiogenic shock, hx freq PVCs thought to have added to prior NSTEMI before 2024, CAD, HTN, HLD, DM-2, PAD, CKD-3, OSA, PAF, and ICD placed 01/2023.    Admitted with recurrent AKI/A/C biventricular HF   Assessment/Plan   1.  Acute on chronic systolic CHF/cardiogenic shock: Echo 4/24 with EF < 20%, moderate RV dysfunction, moderate-severe infarct-related MR, IVC dilated. Lactate elevated, AKI.  Suspect recurrent low output HF.  She has been started on milrinone 0.25, plan for palliative home milrinone.   CVP 6 today.  No co-ox, weight, I/Os or BMET today.  - Needs BMET and co-ox now.  - Continue torsemide 40 mg po bid.  - Continue milrinone 0.25.  With multiple recent admissions with low output HF, we are going to continue palliative home milrinone.  - Repeat co-ox now.   - She is not a candidate for transplant (age) or LVAD (age, elevated creatinine, frailty).  - We discussed hospice care when she goes home.  For now, she wants palliative care followup but not hospice.  2. AKI on CKD stage 3: Suspect cardiorenal syndrome.  Creatinine mildly higher yesterday, 1.89 => 2.0.  - BMET today.  3. Mitral regurgitation: Infarct-related MR.  Moderate on 2/24 TEE, mod-severe on 4/24 echo.  Manage medically.  4. CAD: STEMI 1/24 with PLOM treated with DES.  - Continue Plavix, no ASA given apixaban use.  - Continue statin.  5. Atrial fibrillation: Paroxysmal.  In NSR currently.  - Continue amiodarone 200 daily.  - Continue Eliquis, dosed 2.5 bid for age > 80 and creatinine > 1.5.  6. VT: Has [employer] ICD and on amiodarone.   Marca Ancona 04/29/2023 11:00 AM

## 2023-04-29 NOTE — Progress Notes (Signed)
Mobility Specialist Progress Note:   04/29/23 1605  Mobility  Activity Ambulated with assistance in hallway  Level of Assistance Standby assist, set-up cues, supervision of patient - no hands on  Assistive Device Four wheel walker  Distance Ambulated (ft) 450 ft  Activity Response Tolerated well  Mobility Referral Yes  $Mobility charge 1 Mobility  Mobility Specialist Start Time (ACUTE ONLY) 1605  Mobility Specialist Stop Time (ACUTE ONLY) 1625  Mobility Specialist Time Calculation (min) (ACUTE ONLY) 20 min   Pt agreeable to mobility session. Required only supervision for safety throughout. VSS on RA. X1 seated rest break needed d/t fatigue. Pt back sitting EOB with all needs met, sister in room.  Addison Lank Mobility Specialist Please contact via SecureChat or  Rehab office at [phone]

## 2023-04-29 NOTE — Progress Notes (Signed)
Progress Note   Patient: Natasha Woodard ZOX:[id number] DOB: 17-Oct-1941 DOA: 04/20/2023     8 DOS: the patient was seen and examined on 04/29/2023   Brief hospital course:  31 yof w/ significant complex medical history including chronic HFrEF (EF less than 20%), moderate to severe MR, CAD status post PCI 12/2022, hypertension, hyperlipidemia, paroxysmal A-fib on Eliquis, history of VT arrest, status post ICD, CKD stage IIIb, pseudoaneurysm of right groin.  recently admitted 4/24-5/5 for decompensated CHF, AKI, and septic shock secondary to UTI who was discharged on torsemide 40 mg daily seen by PCP 5/9 and sent to the ED due to concern for AKI.    Patient complains of shortness of breath exertions since discharge, does not think she has gotten any worse, has been taking torsemide, feels her lower extremity edema has improved.She is not on RAAS inhibition or beta-blocker due to low output heart failure/risk of hypotension and not on SGLT2 inhibitor due to recent groin infection. In the ED, her blood pressure was 136/72, HR 73, RR 18 and 02 saturation 95%, lungs with no wheezing or rales, heart with S1 and S2 present and rhythmic, abdomen with no distention and positive lower extremity edema.   Na 139, K 3,6 Cl 96, glucose 118, bun 52, cr 3,0  Lactic acid 2,8  Wbc 4,9 hgb 12,8 plt 268   Chest radiograph with cardiomegaly, bilateral hilar vascular congestion, small bilateral pleural effusions. Sternotomy wires in place. Pacemaker defibrillator in place with one atrial and one ventricular lead.   05/16 patient continue on milrinone drip and furosemide drip.  05/17 volume status is improving, transitioned to oral torsemide.   05/18 patient has been stable on milrinone infusion. Plan for possible discharge home in 48 hrs with palliative care home inotropic infusion.   Assessment and Plan: Chronic HFrEF (heart failure with reduced ejection fraction) (HCC) Echocardiogram with reduced LV systolic  function <20%, mild dilatated LV cavity, RV systolic function preserved, RVSP 72,5 severe dilatation of LA, moderate to severe mitral regurgitation.   Acute on chronic core pulmonale. Pulmonary hypertension.   Urine output not recorded.  Since admission she had 9 Kg weight loss.   Systolic blood pressure 93 to 811 mmHg.   Plan to continue inotropic support with milrinone drip (patient will go home with palliative milrinone infusion).  Transitioned to torsemide 40 mg po bid.  Possible discharge home in 48 hrs.   Acute kidney injury superimposed on chronic kidney disease (HCC) CKD stage 3b.   Check renal panel today, keep K above 4 and Mg above 2.  Plan to continue diuresis with torsemide. Follow up renal function in am.   Coronary artery disease due to lipid rich plaque No chest pain.   Essential hypertension Continue blood pressure monitoring . Milrinone for inotropic support.   Paroxysmal atrial fibrillation (HCC) Continue anticoagulation.  Rate control with amiodarone.   Patient had Ventricular tachycardia in the past.  Has ICD in place.         Subjective: patient is feeling better, dyspnea and edema continue to improve, no orthopnea or PND.   Physical Exam: Vitals:   04/28/23 1657 04/28/23 2044 04/29/23 0528 04/29/23 0849  BP: 95/66  (!) 93/56 113/61  Pulse: 71 72 70 72  Resp: 20 18  19   Temp: 97.6 F (36.4 C) 98.4 F (36.9 C)    TempSrc: Oral Oral    SpO2: 95% 96% 98% 94%  Weight:       Neurology awake and alert  ENT with mild pallor Cardiovascular with S1 and S2 present, regular with positive systolic murmur at the apex,  Mild JVD Lower extremity edema + Respiratory with no rales or wheezing, on anterior auscultation  Abdomen with no distention  Data Reviewed:    Family Communication: her husband is at the bedside   Disposition: Status is: Inpatient Remains inpatient appropriate because: inotropic support.   Planned Discharge Destination:  Home      Author: Coralie Keens, MD 04/29/2023 9:19 AM  For on call review [email or web address].

## 2023-04-30 DIAGNOSIS — I5022 Chronic systolic (congestive) heart failure: Secondary | ICD-10-CM | POA: Diagnosis not present

## 2023-04-30 DIAGNOSIS — N179 Acute kidney failure, unspecified: Secondary | ICD-10-CM | POA: Diagnosis not present

## 2023-04-30 DIAGNOSIS — I2510 Atherosclerotic heart disease of native coronary artery without angina pectoris: Secondary | ICD-10-CM | POA: Diagnosis not present

## 2023-04-30 DIAGNOSIS — I5043 Acute on chronic combined systolic (congestive) and diastolic (congestive) heart failure: Secondary | ICD-10-CM | POA: Diagnosis not present

## 2023-04-30 DIAGNOSIS — I10 Essential (primary) hypertension: Secondary | ICD-10-CM | POA: Diagnosis not present

## 2023-04-30 LAB — COOXEMETRY PANEL
Carboxyhemoglobin: 1.5 % (ref 0.5–1.5)
Methemoglobin: <0.7 % (ref 0.0–1.5)
O2 Saturation: 74.9 %
Total hemoglobin: 11.6 g/dL — ABNORMAL LOW (ref 12.0–16.0)

## 2023-04-30 LAB — BASIC METABOLIC PANEL
Anion gap: 12 (ref 5–15)
Anion gap: 14 (ref 5–15)
BUN: 28 mg/dL — ABNORMAL HIGH (ref 8–23)
BUN: 29 mg/dL — ABNORMAL HIGH (ref 8–23)
CO2: 40 mmol/L — ABNORMAL HIGH (ref 22–32)
CO2: 39 mmol/L — ABNORMAL HIGH (ref 22–32)
Calcium: 9.7 mg/dL (ref 8.9–10.3)
Calcium: 9.8 mg/dL (ref 8.9–10.3)
Chloride: 87 mmol/L — ABNORMAL LOW (ref 98–111)
Chloride: 84 mmol/L — ABNORMAL LOW (ref 98–111)
Creatinine, Ser: 2.15 mg/dL — ABNORMAL HIGH (ref 0.44–1.00)
Creatinine, Ser: 2.1 mg/dL — ABNORMAL HIGH (ref 0.44–1.00)
GFR, Estimated: 22 mL/min — ABNORMAL LOW (ref >60)
GFR, Estimated: 23 mL/min — ABNORMAL LOW (ref >60)
Glucose, Bld: 217 mg/dL — ABNORMAL HIGH (ref 70–99)
Glucose, Bld: 175 mg/dL — ABNORMAL HIGH (ref 70–99)
Potassium: 3.1 mmol/L — ABNORMAL LOW (ref 3.5–5.1)
Potassium: 4.1 mmol/L (ref 3.5–5.1)
Sodium: 139 mmol/L (ref 135–145)
Sodium: 137 mmol/L (ref 135–145)

## 2023-04-30 LAB — GLUCOSE, CAPILLARY
Glucose-Capillary: 102 mg/dL — ABNORMAL HIGH (ref 70–99)
Glucose-Capillary: 259 mg/dL — ABNORMAL HIGH (ref 70–99)
Glucose-Capillary: 141 mg/dL — ABNORMAL HIGH (ref 70–99)
Glucose-Capillary: 124 mg/dL — ABNORMAL HIGH (ref 70–99)

## 2023-04-30 LAB — MAGNESIUM: Magnesium: 3 mg/dL — ABNORMAL HIGH (ref 1.7–2.4)

## 2023-04-30 MED ORDER — MAGNESIUM SULFATE 4 GM/100ML IV SOLN
4.0000 g | Freq: Once | INTRAVENOUS | Status: AC
  Administered 2023-04-30: 4 g via INTRAVENOUS
  Filled 2023-04-30: qty 100

## 2023-04-30 MED ORDER — POTASSIUM CHLORIDE CRYS ER 20 MEQ PO TBCR
40.0000 meq | EXTENDED_RELEASE_TABLET | Freq: Once | ORAL | Status: AC
  Administered 2023-04-30: 40 meq via ORAL
  Filled 2023-04-30: qty 2

## 2023-04-30 MED ORDER — POTASSIUM CHLORIDE 10 MEQ/100ML IV SOLN
INTRAVENOUS | Status: AC
  Filled 2023-04-30: qty 100

## 2023-04-30 MED ORDER — AMIODARONE HCL IN DEXTROSE 360-4.14 MG/200ML-% IV SOLN
INTRAVENOUS | Status: AC
  Filled 2023-04-30: qty 200

## 2023-04-30 MED ORDER — AMIODARONE HCL IN DEXTROSE 360-4.14 MG/200ML-% IV SOLN: 60.0000 mg/h | INTRAVENOUS | Status: DC

## 2023-04-30 MED ORDER — MILRINONE LACTATE IN DEXTROSE 20-5 MG/100ML-% IV SOLN
0.1250 ug/kg/min | INTRAVENOUS | Status: DC
  Administered 2023-04-30: 0.125 ug/kg/min via INTRAVENOUS

## 2023-04-30 MED ORDER — POTASSIUM CHLORIDE 10 MEQ/100ML IV SOLN
10.0000 meq | INTRAVENOUS | Status: AC
  Administered 2023-04-30 (×4): 10 meq via INTRAVENOUS
  Filled 2023-04-30 (×2): qty 100

## 2023-04-30 MED ORDER — AMIODARONE HCL IN DEXTROSE 360-4.14 MG/200ML-% IV SOLN: 30.0000 mg/h | INTRAVENOUS | Status: DC

## 2023-04-30 MED ORDER — ONDANSETRON HCL 4 MG/2ML IJ SOLN
4.0000 mg | Freq: Four times a day (QID) | INTRAMUSCULAR | Status: DC | PRN
  Administered 2023-04-30: 4 mg via INTRAVENOUS
  Filled 2023-04-30: qty 2

## 2023-04-30 MED ORDER — AMIODARONE HCL IN DEXTROSE 360-4.14 MG/200ML-% IV SOLN
30.0000 mg/h | INTRAVENOUS | Status: DC
  Administered 2023-04-30 (×2): 30 mg/h via INTRAVENOUS
  Filled 2023-04-30: qty 200

## 2023-04-30 NOTE — Progress Notes (Addendum)
Progress Note   Patient: Natasha Woodard ZOX:[id number] DOB: 12-24-1940 DOA: 04/20/2023     9 DOS: the patient was seen and examined on 04/30/2023   Brief hospital course:  16 yof w/ significant complex medical history including chronic HFrEF (EF less than 20%), moderate to severe MR, CAD status post PCI 12/2022, hypertension, hyperlipidemia, paroxysmal A-fib on Eliquis, history of VT arrest, status post ICD, CKD stage IIIb, pseudoaneurysm of right groin.  recently admitted 4/24-5/5 for decompensated CHF, AKI, and septic shock secondary to UTI who was discharged on torsemide 40 mg daily seen by PCP 5/9 and sent to the ED due to concern for AKI.    Patient complains of shortness of breath exertions since discharge, does not think she has gotten any worse, has been taking torsemide, feels her lower extremity edema has improved.She is not on RAAS inhibition or beta-blocker due to low output heart failure/risk of hypotension and not on SGLT2 inhibitor due to recent groin infection. In the ED, her blood pressure was 136/72, HR 73, RR 18 and 02 saturation 95%, lungs with no wheezing or rales, heart with S1 and S2 present and rhythmic, abdomen with no distention and positive lower extremity edema.   Na 139, K 3,6 Cl 96, glucose 118, bun 52, cr 3,0  Lactic acid 2,8  Wbc 4,9 hgb 12,8 plt 268   Chest radiograph with cardiomegaly, bilateral hilar vascular congestion, small bilateral pleural effusions. Sternotomy wires in place. Pacemaker defibrillator in place with one atrial and one ventricular lead.   05/16 patient continue on milrinone drip and furosemide drip.  05/17 volume status is improving, transitioned to oral torsemide.   05/18 patient has been stable on milrinone infusion. Plan for possible discharge home in 48 hrs with palliative care home inotropic infusion.  05/19 patient having VT 170 to 200 bpm, loss of consciousness x2, recovering after defibrillator firing.  Changed to IV amiodarone.    Assessment and Plan: Chronic HFrEF (heart failure with reduced ejection fraction) (HCC) Echocardiogram with reduced LV systolic function <20%, mild dilatated LV cavity, RV systolic function preserved, RVSP 72,5 severe dilatation of LA, moderate to severe mitral regurgitation.   Acute on chronic core pulmonale. Pulmonary hypertension.   Urine output not recorded, but she has lost 4 Kg over last 24 hrs.    Systolic blood pressure 105 to 145 mmHg.   Monomorphic ventricular tachycardia, patient had 2 episodes of VT, reate 170 to 200 bpm, corrected with defibrillator firing.  Plan to change back to amiodarone drip and decrease dose of milrinone.  Keep K at 4 and Mg at 2.  Continue telemetry monitoring, her husband confirmed she is DNR.   Acute kidney injury superimposed on chronic kidney disease (HCC) CKD stage 3b. Hypokalemia.   Renal function with serum cr at 2,15 with K at 3,1 and serum bicarbonate at 40. Na 139,   Plan to correct with Kcl 40 meq po and 40 meq IV.  Add 4 g magnesium. Check electrolytes this pm 1600.  Keep K above 4 and Mg above 2.    Coronary artery disease due to lipid rich plaque No chest pain.   Essential hypertension Continue blood pressure monitoring . Milrinone for inotropic support, decreased dose due to ventricular tachycardia.    Paroxysmal atrial fibrillation (HCC) Continue anticoagulation.   Recurrent ventricular tachycardia, change amiodarone to drip. Keep k at 4 and Mg at 2.  Continue anticoagulation.         Subjective: patient had episode of syncope,  telemetry with VT, corrected with defibrillator firing.   Physical Exam: Vitals:   04/29/23 2000 04/29/23 2101 04/30/23 0500 04/30/23 0839  BP:  (!) 100/55 117/79 (!) 105/56  Pulse: 85 71  70  Resp:   18 20  Temp:  98.2 F (36.8 C) 97.8 F (36.6 C) 97.9 F (36.6 C)  TempSrc:  Oral Axillary Oral  SpO2: 92% 91% 92% 90%  Weight:    64.7 kg   Neurology awake and alert, post  defibrillator firing  ENT with no pallor Cardiovascular with S1 and S2 present and regular, no gallops, positive systolic murmur Mild JVD Positive lower extremity edema + Respiratory with no rales or wheezing, no rhonchi Abdomen with no distention  Data Reviewed:    Family Communication: I spoke with patient's husband  at the bedside, we talked in detail about patient's condition, plan of care and prognosis and all questions were addressed.   Disposition: Status is: Inpatient Remains inpatient appropriate because: heart failure end stage   Planned Discharge Destination: Home      Author: Coralie Keens, MD 04/30/2023 12:15 PM  For on call review [email or web address].

## 2023-04-30 NOTE — Progress Notes (Signed)
Patient ID: NEKAYLA SCHREIFELS, female   DOB: Apr 12, 1941, 82 y.o.   MRN: [id number]     Advanced Heart Failure Rounding Note  PCP-Cardiologist: Bryan Lemma, MD   Subjective:    On Milrinone 0.25 mcg/kg/min.  Co-ox 75%.  No BMET today.  She did have a weight today which was lower than prior.  I/Os have proved impossible.  CVP is unhooked.   She feels good today, walked without dyspnea in hall with walker yesterday.   Objective:   Weight Range: 64.7 kg Body mass index is 26.08 kg/m.   Vital Signs:   Temp:  [97.8 F (36.6 C)-98.2 F (36.8 C)] 97.9 F (36.6 C) (05/19 0839) Pulse Rate:  [70-85] 70 (05/19 0839) Resp:  [18-20] 20 (05/19 0839) BP: (100-117)/(55-79) 105/56 (05/19 0839) SpO2:  [90 %-92 %] 90 % (05/19 0839) Weight:  [64.7 kg] 64.7 kg (05/19 0839) Last BM Date : 04/29/23  Weight change: Filed Weights   04/28/23 0044 04/28/23 0441 04/30/23 0839  Weight: 68 kg 68 kg 64.7 kg    Intake/Output:   Intake/Output Summary (Last 24 hours) at 04/30/2023 1024 Last data filed at 04/30/2023 0000 Gross per 24 hour  Intake 385.11 ml  Output --  Net 385.11 ml     Physical Exam   General: NAD Neck: No JVD, no thyromegaly or thyroid nodule.  Lungs: Clear to auscultation bilaterally with normal respiratory effort. CV: Nondisplaced PMI.  Heart regular S1/S2, no S3/S4, no murmur.  1+ ankle edema.  Abdomen: Soft, nontender, no hepatosplenomegaly, no distention.  Skin: Intact without lesions or rashes.  Neurologic: Alert and oriented x 3.  Psych: Normal affect. Extremities: No clubbing or cyanosis.  HEENT: Normal.   Telemetry   A-paced 70s (personally reviewed)  EKG    N/A   Labs    CBC Recent Labs    04/28/23 0545  WBC 4.3  HGB 11.8*  HCT 39.1  MCV 92.2  PLT 259   Basic Metabolic Panel Recent Labs    40/98/11 0545 04/29/23 1135  NA 141 139  K 4.2 3.2*  CL 91* 88*  CO2 37* 37*  GLUCOSE 116* 261*  BUN 29* 29*  CREATININE 2.00* 2.12*  CALCIUM 9.7  9.4   Liver Function Tests No results for input(s): "AST", "ALT", "ALKPHOS", "BILITOT", "PROT", "ALBUMIN" in the last 72 hours.  No results for input(s): "LIPASE", "AMYLASE" in the last 72 hours. Cardiac Enzymes No results for input(s): "CKTOTAL", "CKMB", "CKMBINDEX", "TROPONINI" in the last 72 hours.  BNP: BNP (last 3 results) Recent Labs    04/05/23 1820 04/06/23 0058 04/20/23 1758  BNP 2,550.1* 2,843.8* 2,204.9*    ProBNP (last 3 results) Recent Labs    06/09/22 1506  PROBNP 4,638*     D-Dimer No results for input(s): "DDIMER" in the last 72 hours. Hemoglobin A1C No results for input(s): "HGBA1C" in the last 72 hours. Fasting Lipid Panel No results for input(s): "CHOL", "HDL", "LDLCALC", "TRIG", "CHOLHDL", "LDLDIRECT" in the last 72 hours. Thyroid Function Tests No results for input(s): "TSH", "T4TOTAL", "T3FREE", "THYROIDAB" in the last 72 hours.  Invalid input(s): "FREET3"  Other results:   Imaging    No results found.   Medications:     Scheduled Medications:  amiodarone  200 mg Oral Daily   apixaban  2.5 mg Oral BID   clopidogrel  75 mg Oral Daily   insulin aspart  0-9 Units Subcutaneous TID WC   polyethylene glycol  17 g Oral BID   potassium chloride  40 mEq Oral Once   rosuvastatin  10 mg Oral Daily   senna-docusate  2 tablet Oral BID   sodium chloride flush  10-40 mL Intracatheter Q12H   sodium chloride flush  10-40 mL Intracatheter Q12H   torsemide  40 mg Oral BID   traMADol  50 mg Oral BID    Infusions:  milrinone 0.25 mcg/kg/min (04/30/23 0948)    PRN Medications: acetaminophen **OR** acetaminophen, bisacodyl, sodium chloride flush, sodium chloride flush    Patient Profile   WAUNITA KIHARA is a 82 y.o. female with a hx of STEMI in Jan 2024 with PCI, cardiogenic shock, hx freq PVCs thought to have added to prior NSTEMI before 2024, CAD, HTN, HLD, DM-2, PAD, CKD-3, OSA, PAF, and ICD placed 01/2023.    Admitted with recurrent  AKI/A/C biventricular HF   Assessment/Plan   1. Acute on chronic systolic CHF/cardiogenic shock: Echo 4/24 with EF < 20%, moderate RV dysfunction, moderate-severe infarct-related MR, IVC dilated. Lactate elevated, AKI.  Suspect recurrent low output HF.  She has been started on milrinone 0.25, plan for palliative home milrinone.   She is not volume overloaded on exam.  CVP not set up.  Weight down.  BMET not done.  Co-ox good at 75%.  - Needs BMET now - Continue torsemide 40 mg po bid.  Will give KCl 40 x 1.  - Continue milrinone 0.25.  With multiple recent admissions with low output HF, we are going to continue palliative home milrinone.  - She is not a candidate for transplant (age) or LVAD (age, elevated creatinine, frailty).  - We discussed hospice care when she goes home.  For now, she wants palliative care followup but not hospice.  2. AKI on CKD stage 3: Suspect cardiorenal syndrome.  Creatinine mildly higher yesterday, 1.89 => 2.0 => 2.12.  - BMET today.  3. Mitral regurgitation: Infarct-related MR.  Moderate on 2/24 TEE, mod-severe on 4/24 echo.  Manage medically.  4. CAD: STEMI 1/24 with PLOM treated with DES.  - Continue Plavix, no ASA given apixaban use.  - Continue statin.  5. Atrial fibrillation: Paroxysmal.  In NSR currently.  - Continue amiodarone 200 daily.  - Continue Eliquis, dosed 2.5 bid for age > 80 and creatinine > 1.5.  6. VT: Has [employer] ICD and on amiodarone.   Plan for home with home milrinone tomorrow.   Marca Ancona 04/30/2023 10:24 AM

## 2023-05-01 ENCOUNTER — Telehealth: Payer: Self-pay | Admitting: *Deleted

## 2023-05-01 ENCOUNTER — Other Ambulatory Visit (HOSPITAL_COMMUNITY): Payer: Self-pay

## 2023-05-01 DIAGNOSIS — I5043 Acute on chronic combined systolic (congestive) and diastolic (congestive) heart failure: Secondary | ICD-10-CM | POA: Diagnosis not present

## 2023-05-01 DIAGNOSIS — R570 Cardiogenic shock: Secondary | ICD-10-CM

## 2023-05-01 DIAGNOSIS — I10 Essential (primary) hypertension: Secondary | ICD-10-CM | POA: Diagnosis not present

## 2023-05-01 DIAGNOSIS — I5022 Chronic systolic (congestive) heart failure: Secondary | ICD-10-CM | POA: Diagnosis not present

## 2023-05-01 DIAGNOSIS — N179 Acute kidney failure, unspecified: Secondary | ICD-10-CM | POA: Diagnosis not present

## 2023-05-01 DIAGNOSIS — I2510 Atherosclerotic heart disease of native coronary artery without angina pectoris: Secondary | ICD-10-CM | POA: Diagnosis not present

## 2023-05-01 DIAGNOSIS — R531 Weakness: Secondary | ICD-10-CM

## 2023-05-01 LAB — COOXEMETRY PANEL
Carboxyhemoglobin: 1.5 % (ref 0.5–1.5)
Methemoglobin: 0.7 % (ref 0.0–1.5)
O2 Saturation: 67 %
Total hemoglobin: 11.8 g/dL — ABNORMAL LOW (ref 12.0–16.0)

## 2023-05-01 LAB — BASIC METABOLIC PANEL
Anion gap: 10 (ref 5–15)
Anion gap: 12 (ref 5–15)
BUN: 27 mg/dL — ABNORMAL HIGH (ref 8–23)
BUN: 27 mg/dL — ABNORMAL HIGH (ref 8–23)
CO2: 41 mmol/L — ABNORMAL HIGH (ref 22–32)
CO2: 37 mmol/L — ABNORMAL HIGH (ref 22–32)
Calcium: 9.2 mg/dL (ref 8.9–10.3)
Calcium: 9 mg/dL (ref 8.9–10.3)
Chloride: 84 mmol/L — ABNORMAL LOW (ref 98–111)
Chloride: 83 mmol/L — ABNORMAL LOW (ref 98–111)
Creatinine, Ser: 2.02 mg/dL — ABNORMAL HIGH (ref 0.44–1.00)
Creatinine, Ser: 2.06 mg/dL — ABNORMAL HIGH (ref 0.44–1.00)
GFR, Estimated: 24 mL/min — ABNORMAL LOW (ref >60)
GFR, Estimated: 24 mL/min — ABNORMAL LOW (ref >60)
Glucose, Bld: 112 mg/dL — ABNORMAL HIGH (ref 70–99)
Glucose, Bld: 324 mg/dL — ABNORMAL HIGH (ref 70–99)
Potassium: 3.3 mmol/L — ABNORMAL LOW (ref 3.5–5.1)
Potassium: 3.8 mmol/L (ref 3.5–5.1)
Sodium: 135 mmol/L (ref 135–145)
Sodium: 132 mmol/L — ABNORMAL LOW (ref 135–145)

## 2023-05-01 LAB — GLUCOSE, CAPILLARY
Glucose-Capillary: 116 mg/dL — ABNORMAL HIGH (ref 70–99)
Glucose-Capillary: 164 mg/dL — ABNORMAL HIGH (ref 70–99)

## 2023-05-01 LAB — MAGNESIUM: Magnesium: 2.6 mg/dL — ABNORMAL HIGH (ref 1.7–2.4)

## 2023-05-01 MED ORDER — AMIODARONE HCL 200 MG PO TABS
400.0000 mg | ORAL_TABLET | Freq: Two times a day (BID) | ORAL | 0 refills | Status: DC
  Filled 2023-05-01: qty 120, 30d supply, fill #0

## 2023-05-01 MED ORDER — POTASSIUM CHLORIDE CRYS ER 20 MEQ PO TBCR
40.0000 meq | EXTENDED_RELEASE_TABLET | ORAL | Status: AC
  Administered 2023-05-01 (×2): 40 meq via ORAL
  Filled 2023-05-01 (×2): qty 2

## 2023-05-01 MED ORDER — MILRINONE LACTATE IN DEXTROSE 20-5 MG/100ML-% IV SOLN: 0.1250 ug/kg/min | INTRAVENOUS | Status: DC

## 2023-05-01 MED ORDER — TRAMADOL HCL 50 MG PO TABS: 50.0000 mg | ORAL_TABLET | Freq: Two times a day (BID) | ORAL | 0 refills | Status: DC | PRN

## 2023-05-01 MED ORDER — POTASSIUM CHLORIDE CRYS ER 20 MEQ PO TBCR
40.0000 meq | EXTENDED_RELEASE_TABLET | Freq: Once | ORAL | Status: AC
  Administered 2023-05-01: 40 meq via ORAL
  Filled 2023-05-01: qty 2

## 2023-05-01 MED ORDER — AMIODARONE HCL 200 MG PO TABS
400.0000 mg | ORAL_TABLET | Freq: Two times a day (BID) | ORAL | Status: DC
  Administered 2023-05-01: 400 mg via ORAL
  Filled 2023-05-01: qty 2

## 2023-05-01 MED ORDER — TORSEMIDE 20 MG PO TABS
40.0000 mg | ORAL_TABLET | Freq: Two times a day (BID) | ORAL | 0 refills | Status: DC
  Filled 2023-05-01: qty 120, 30d supply, fill #0

## 2023-05-01 NOTE — Progress Notes (Signed)
Discharge instructions (including medications) discussed with and copy provided to patient/caregiver  Patient and her husband were given the AVS discharge instructions.  They were told what medication is still due tonight and her TOC medication has been delivered to her.    Patient picc line is in place and IV infusion company will be here to give that medication instruction.  Patient requested O2 during the discharge and case worker is going to work on that.

## 2023-05-01 NOTE — TOC Progression Note (Addendum)
Transition of Care [facility]) - Progression Note    Patient Details  Name: Natasha Woodard MRN: [id number] Date of Birth: [birth year]  Transition of Care [facility]) CM/SW Contact  Elliot Cousin, RN Phone Number: [phone] 05/01/2023, 1:35 PM  Clinical Narrative:   CM in room talking to patient and oxygen sats dropping to 87%. Updated attending. Contacted [employer] rep, Jermaine for oxygen for home.   PT walked pt in the hall, her oxygen sats did not drop to 88 or below to qualify for oxygen at home. Contacted [facility] rep, for scheduled dc today. She will come to educate pt and family at 4 pm. [facility] rep, Morrie Sheldon to make aware of dc home today with [facility]. Updated attending.     Expected Discharge Plan: Home w Home Health Services Barriers to Discharge: Continued Medical Work up  Expected Discharge Plan and Services   Discharge Planning Services: CM Consult     Expected Discharge Date: 04/26/23                           [facility] Agency: [facility] Date HH Agency Contacted: 04/25/23 Time HH Agency Contacted: [id number] Representative spoke with at [facility] Agency: Duwaine Maxin, [employer] rep, Jeri Modena, [facility] rep   Social Determinants of Health (SDOH) Interventions SDOH Screenings   Food Insecurity: No Food Insecurity (04/17/2023)  Housing: Low Risk  (04/17/2023)  Transportation Needs: No Transportation Needs (04/17/2023)  Utilities: Not At Risk (04/06/2023)  Depression (PHQ2-9): Medium Risk (04/20/2023)  Financial Resource Strain: Low Risk  (03/27/2023)  Physical Activity: Sufficiently Active (03/27/2023)  Social Connections: Unknown (03/27/2023)  Stress: No Stress Concern Present (03/27/2023)  Tobacco Use: Medium Risk (04/21/2023)    Readmission Risk Interventions    04/26/2023    3:10 PM 04/03/2023   10:10 AM  Readmission Risk Prevention Plan  Transportation Screening Complete Complete  PCP or Specialist  Appt within 5-7 Days  Complete  Home Care Screening  Complete  Medication Review (RN CM)  Referral to Pharmacy  Medication Review (RN Care Manager) Complete   HRI or Home Care Consult Complete   Palliative Care Screening Complete   Skilled Nursing Facility Not Applicable

## 2023-05-01 NOTE — Discharge Summary (Addendum)
Physician Discharge Summary   Patient: Natasha Woodard MRN: [id number] DOB: 1941-09-01  Admit date:     04/20/2023  Discharge date: 05/01/23  Discharge Physician: York Ram Leanthony Rhett   PCP: [name], MD   Recommendations at discharge:    Patient is being discharged with home infusion of IV milrinone, for palliative care.  Increased amiodarone to 400 mg po bid.  In case of recurrent and persistent ventricular tachycardia, may need to transition to comfort care.  Follow up electrolytes and renal function in 7 days, keep K 4 or greater and Mg 2 or greater.  Continue diuresis with torsemide 40 mg bid (increased from 40 mg po daily).    Discharge Diagnoses: Active Problems:   Chronic HFrEF (heart failure with reduced ejection fraction) (HCC)   Acute kidney injury superimposed on chronic kidney disease (HCC)   Coronary artery disease due to lipid rich plaque   Essential hypertension   Paroxysmal atrial fibrillation (HCC)  Resolved Problems:   * No resolved hospital problems. [facility] Course: Natasha Woodard was admitted to the hospital with the working diagnosis of hear failure exacerbation.    82 yof w/ significant complex medical history including chronic HFrEF (EF less than 20%), moderate to severe MR, CAD status post PCI 12/2022, hypertension, hyperlipidemia, paroxysmal A-fib on Eliquis, history of VT arrest, status post ICD, CKD stage IIIb, pseudoaneurysm of right groin.  recently admitted 4/24-5/5 for decompensated CHF, AKI, and septic shock secondary to UTI who was discharged on torsemide 40 mg daily seen by PCP 5/9 and sent to the ED due to concern for AKI.    Patient complained of shortness of breath exertion since discharge, does not think she has gotten any worse, has been taking torsemide, feels her lower extremity edema has improved.She is not on RAAS inhibition or beta-blocker due to low output heart failure/risk of hypotension and not on SGLT2 inhibitor due to  recent groin infection. In the ED, her blood pressure was 136/72, HR 73, RR 18 and 02 saturation 95%, lungs with no wheezing or rales, heart with S1 and S2 present and rhythmic, abdomen with no distention and positive lower extremity edema.   Na 139, K 3,6 Cl 96, glucose 118, bun 52, cr 3,0  Lactic acid 2,8  Wbc 4,9 hgb 12,8 plt 268   Chest radiograph with cardiomegaly, bilateral hilar vascular congestion, small bilateral pleural effusions. Sternotomy wires in place. Pacemaker defibrillator in place with one atrial and one ventricular lead.   EKG 71 bpm, right axis deviation, right bundle branch block, atrial paced and ventricular sensing, one ventricular paced beat, qtc 461, with no significant ST segment or T wave changes.   05/16 patient was placed on milrinone drip and furosemide drip.  05/17 volume status is improving, transitioned to oral torsemide.   05/18 patient has been stable on milrinone infusion. Plan for possible discharge home in 48 hrs with palliative care home inotropic infusion.  05/19 patient having VT 170 to 200 bpm, loss of consciousness x2, recovering after defibrillator firing.  Changed to IV amiodarone.  05/20 no further VT episodes, patient will go home today with palliative inotropic support, if continue ICD firing may need to transition to comfort care.   Assessment and Plan: Acute on chronic systolic CHF (congestive heart failure) (HCC) Echocardiogram with reduced LV systolic function <20%, mild dilatated LV cavity, RV systolic function preserved, RVSP 72,5 severe dilatation of LA, moderate to severe mitral regurgitation.   Acute on chronic core pulmonale.  Pulmonary hypertension.   Patient was placed on aggressive diuresis with furosemide drip and inotropic support with milrinone, negative fluid balance was achieved, with improvement in her symptoms.  Patient lost 6 kg during this hospitalization.   Monomorphic ventricular tachycardia, patient had 2 episodes of  VT, rate 170 to 200 bpm, corrected with defibrillator firing x2.   Amiodarone was reloaded with IV formulation and dose of milrinone was decreased.    K was corrected with Kcl and will continue oral amiodarone.  Plan to have close follow up as outpatient. If recurrent and persistent VT, may have to transition to comfort care.   Acute kidney injury superimposed on chronic kidney disease (HCC) CKD stage 3b. Hypokalemia.   At the time of her discharge her serum cr was 2,0 with K at 3,3 and serum bicarbonate at 41. Na 135   Added Kcl, and will follow up K levels prior to discharge.   Coronary artery disease due to lipid rich plaque No chest pain.   Essential hypertension Continue blood pressure monitoring . Milrinone for inotropic support, decreased dose due to ventricular tachycardia.    Paroxysmal atrial fibrillation (HCC) Continue anticoagulation.  On amiodarone for rate control.         Consultants: cardiology  Procedures performed: none   Disposition: Home Diet recommendation:  Cardiac diet DISCHARGE MEDICATION: Allergies as of 05/01/2023       Reactions   Chlorhexidine Gluconate Rash   Pt refuses CHG due to inflammation and rash   Ezetimibe-simvastatin Other (See Comments)   REACTION: Muscle aches (side effect)   Lipitor [atorvastatin] Other (See Comments)   Leg weakness    Claritin [loratadine]    Possible cause of nightmares.     Spironolactone    Caution re: elevated potassium/creatinine   Tamiflu [oseltamivir Phosphate] Other (See Comments)   nightmares   Pravastatin Sodium Other (See Comments)   REACTION: Muscle aches (side effect)   Sulfonamide Derivatives Nausea And Vomiting        Medication List     STOP taking these medications    feeding supplement (GLUCERNA SHAKE) Liqd       TAKE these medications    Accu-Chek Aviva Plus test strip Generic drug: glucose blood USE TO TEST BLOOD SUGAR ONCE DAILY   acetaminophen 325 MG  tablet Commonly known as: Tylenol Take 1-2 tablets (325-650 mg total) by mouth every 6 (six) hours as needed. What changed: when to take this   amiodarone 400 MG tablet Commonly known as: PACERONE Take 1 tablet (400 mg total) by mouth 2 (two) times daily. What changed:  medication strength how much to take when to take this   apixaban 2.5 MG Tabs tablet Commonly known as: ELIQUIS Take 1 tablet (2.5 mg total) by mouth 2 (two) times daily.   clopidogrel 75 MG tablet Commonly known as: PLAVIX Take 1 tablet (75 mg total) by mouth daily.   melatonin 5 MG Tabs Take 5 mg by mouth at bedtime as needed (for sleep).   milrinone 20 MG/100 ML Soln infusion Commonly known as: PRIMACOR Inject 0.0063 mg/min into the vein continuous.   multivitamin with minerals Tabs tablet Take 1 tablet by mouth daily.   ondansetron 4 MG disintegrating tablet Commonly known as: ZOFRAN-ODT Take 1 tablet (4 mg total) by mouth every 8 (eight) hours as needed for nausea or vomiting.   rosuvastatin 10 MG tablet Commonly known as: CRESTOR Take 1 tablet (10 mg total) by mouth daily. TAKE 1 TABLET(10 MG) BY MOUTH  DAILY Strength: 10 mg What changed: additional instructions   torsemide 20 MG tablet Commonly known as: DEMADEX Take 2 tablets (40 mg total) by mouth 2 (two) times daily. What changed: when to take this   traMADol 50 MG tablet Commonly known as: ULTRAM Take 1 tablet (50 mg total) by mouth 2 (two) times daily as needed for severe pain. What changed:  when to take this reasons to take this   triamcinolone cream 0.1 % Commonly known as: KENALOG Apply 1 Application topically 2 (two) times daily as needed (for itching). What changed: additional instructions               Durable Medical Equipment  (From admission, onward)           Start     Ordered   05/01/23 1001  Heart failure home health orders  (Heart failure home health orders / Face to face)  Once       Comments: Heart  Failure Follow-up Care:  Verify follow-up appointments per Patient Discharge Instructions. Confirm transportation arranged. Reconcile home medications with discharge medication list. Remove discontinued medications from use. Assist patient/caregiver to manage medications using pill box. Reinforce low sodium food selection Assessments: Vital signs and oxygen saturation at each visit. Assess home environment for safety concerns, caregiver support and availability of low-sodium foods. Consult Child psychotherapist, PT/OT, Dietitian, and CNA based on assessments. Perform comprehensive cardiopulmonary assessment. Notify MD for any change in condition or weight gain of 3 pounds in one day or 5 pounds in one week with symptoms. Daily Weights and Symptom Monitoring: Ensure patient has access to scales. Teach patient/caregiver to weigh daily before breakfast and after voiding using same scale and record.    Teach patient/caregiver to track weight and symptoms and when to notify Provider. Activity: Develop individualized activity plan with patient/caregiver.    Home Paraenteral Inotropic Therapy : Data Collection Form  Patients name: Natasha Woodard   Date: 04/25/23  Information below may not be completed by the supplier nor anyone in a Financial relationship with the supplier.  NYHA IV  1. Results of invasive hemodynamic monitoring  Cardiac Index Before Inotrope infusion:            1.5                On Inotrope infusion:            2               Drug and dose:   Milrinone 0.125 mcg.   2. Cardiac medications immediately prior to inotrope infusion (List name, dose, and frequency) IV lasix.    3. Dose this represent maximum tolerated doses of these medications? Yes.   4. Breathing status Prior to inotrope infusion: Dyspnea at rest  At time of discharge: Dyspnea on moderate  exertion.   5. Initial home prescription Drug and Dose:   Milrinone 0.125 mcg  for continuous infusion 24/hr day  and 7 days/week  6. If continuous infusion is prescribed, have attempts to discontinue inotrope infusion in the hospital failed?   Yes.   7. If intermittent infusion is prescribed, have there been repeated hospitalizations for heart failure which Parenteral inotrope were required? Not applicable.   8. Is patient capable of going to the physician for outpatient evaluation? Yes.    9. Is routine electrocardiographic monitoring required in the Home?  No.   The above statements and any additional explanations included separately are true and accurate and  there is documentation present in the patients medical record to support these statements.   Completed by Tonye Becket, NP   In instances where this form was completed by an Advanced Practice Provider, please see EMR for physician Co-Signature.  [facility] to provide  Labs every other week to include BMET, Mg, and CBC with Diff. Additional as needed. Should be drawn via PERIPHERAL stick. NOT PICC line.   Z6109 Milrinone 0.125  mcg/kg/min X 52 weeks  A4221 Supplies for maintenance of drug infusion catheter A4222 Supplies for the external drug infusion per cassette or bag E0781 Ambulatory Infusion pump  Question Answer Comment  Heart Failure Follow-up Care [facility] at [phone]   Obtain the following labs Basic Metabolic Panel   Lab frequency Weekly   Fax lab results to [facility] at [phone]   Diet Low Sodium Heart Healthy   Fluid restrictions: 1800 mL Fluid      05/01/23 1001            Follow-up Information     [facility] Follow up.   Why: Home Infusion -will provide Milrinone Contact information: [phone]        [facility] Follow up.   Why: Home Health RN and Physical Therapy-agency will call to arrange visit Contact information: [phone]        [facility] Follow up.   Specialty: Hospice and Palliative Medicine Why: Outpatient Pallaitive  Services- will call to arrange appointment Contact information: [address] [place] [address] [phone]        [facility] Follow up on 05/09/2023.   Specialty: Cardiology Why: 05/09/23 at 10:30 am [facility] at [facility] [place] (Dr. Bensimhon's office) Contact information: [address] [phone] [address]he[address] Bonito [place] [address] [phone]               Discharge Exam: Filed Weights   04/28/23 0044 04/28/23 0441 04/30/23 0839  Weight: 68 kg 68 kg 64.7 kg   BP 111/66 (BP Location: Left Arm)   Pulse 71   Temp (!) 97.4 F (36.3 C) (Oral)   Resp 18   Wt 64.7 kg   SpO2 92%   BMI 26.08 kg/m   Patient with no chest pain or dyspnea, no PND, no further ICD firing  Neurology awake and alert ENT with mild pallor Cardiovascular with S1 and S2 present and regular with positive systolic murmur at the apex Mild JVD Trace lower extremity edema Respiratory with no rales or wheezing Abdomen with no distention   Condition at discharge: stable  The results of significant diagnostics from this hospitalization (including imaging, microbiology, ancillary and laboratory) are listed below for reference.   Imaging Studies: DG Chest Port 1 View  Result Date: 04/25/2023 CLINICAL DATA:  PICC line placement. EXAM: PORTABLE CHEST 1 VIEW COMPARISON:  04/20/2023 FINDINGS: Interval placement of a right PICC catheter with tip over the low SVC region. No pneumothorax. Cardiac pacemaker. Shallow inspiration. Mild cardiac enlargement. No vascular congestion, edema, or consolidation. Probable linear atelectasis in the left base. Calcification of the aorta. Degenerative changes in the spine and shoulders. IMPRESSION: Right PICC line placed with tip projecting over the low SVC region. No pneumothorax. Cardiac enlargement as before. Electronically Signed   By: Burman Nieves M.D.   On:  04/25/2023 18:16   [place] EKG SITE RITE  Result Date: 04/25/2023 If [employer] not  attached, placement could not be confirmed due to current cardiac rhythm.  US RENAL  Result Date: 04/21/2023 CLINICAL DATA:  Acute kidney injury EXAM: RENAL / URINARY TRACT ULTRASOUND COMPLETE COMPARISON:  04/06/2023 right upper quadrant ultrasound. FINDINGS: Right Kidney: Renal measurements: 10 x 4 x 5 cm no hydronephrosis. Echogenic area at the anterior cortex is perinephric fat by prior CT. No concerning mass. No hydronephrosis. Left Kidney: Renal measurements: 10 x 5 x 5 cm no hydronephrosis or lesion seen. Suboptimal visualization due to shadowing colonic gas. Bladder: Not visualized. IMPRESSION: No acute finding.  No hydronephrosis. Electronically Signed   By: Tiburcio Pea M.D.   On: 04/21/2023 06:17   DG Chest 2 View  Result Date: 04/20/2023 CLINICAL DATA:  Shortness of breath EXAM: CHEST - 2 VIEW COMPARISON:  04/08/2023 FINDINGS: Cardiac shadow is enlarged. Defibrillator is again noted. Aortic calcifications are seen. Previously noted right central venous catheter has been removed. Lungs are clear. No acute bony abnormality is noted. IMPRESSION: Stable cardiomegaly.  No acute abnormality noted. Electronically Signed   By: Alcide Clever M.D.   On: 04/20/2023 23:31   DG CHEST PORT 1 VIEW  Result Date: 04/08/2023 CLINICAL DATA:  Check central line placement EXAM: PORTABLE CHEST 1 VIEW COMPARISON:  04/07/2023 FINDINGS: Cardiac shadow is enlarged but stable. Defibrillator is again noted and stable. Right jugular central line is noted with catheter tip at the cavoatrial junction. Pneumothorax is seen. No focal infiltrate or effusion is noted. No bony abnormality is noted. IMPRESSION: No pneumothorax following central line placement. No other focal abnormality is noted. Electronically Signed   By: Alcide Clever M.D.   On: 04/08/2023 19:52   DG CHEST PORT 1 VIEW  Result Date: 04/07/2023 CLINICAL DATA:  Abnormal  respiration. EXAM: PORTABLE CHEST 1 VIEW COMPARISON:  04/05/2023 FINDINGS: The cardio pericardial silhouette is enlarged. There is pulmonary vascular congestion without overt pulmonary edema. Left pacer/AICD again noted. Right IJ central line tip overlies the SVC/RA junction. IMPRESSION: Enlargement of the cardiopericardial silhouette with pulmonary vascular congestion. Electronically Signed   By: Kennith Center M.D.   On: 04/07/2023 06:10   US Abdomen Limited RUQ (LIVER/GB)  Result Date: 04/06/2023 CLINICAL DATA:  Acute cholecystitis. EXAM: ULTRASOUND ABDOMEN LIMITED RIGHT UPPER QUADRANT COMPARISON:  None Available. FINDINGS: Gallbladder: No gallstones or wall thickening visualized. No sonographic Murphy sign noted by sonographer. Common bile duct: Diameter: 3.5 mm Liver: Within normal limits in parenchymal echogenicity. 0.3 x 1.3 x 1.8 cm hypoechoic mass in the subcapsular left lobe of the liver with appearance of a cyst. No internal blood flow. Portal vein is patent on color Doppler imaging with normal direction of blood flow towards the liver. Other: Right pleural effusion. IMPRESSION: Right pleural effusion. No evidence of acute cholecystitis or cholelithiasis. 1.8 cm benign-appearing subcapsular cyst in the left lobe of the liver. Electronically Signed   By: Ted Mcalpine M.D.   On: 04/06/2023 15:55   ECHOCARDIOGRAM COMPLETE  Result Date: 04/06/2023    ECHOCARDIOGRAM REPORT   Patient Name:   HETAL ALTENHOFEN Date of Exam: 04/06/2023 Medical Rec #:  [id number]         Height:       62.0 in Accession #:    [id number]        Weight:       155.4 lb Date of Birth:  Jun 15, 1941         BSA:          1.717 m Patient Age:  81 years          BP:           118/76 mmHg Patient Gender: F                 HR:           71 bpm. Exam Location:  Inpatient Procedure: 2D Echo, Cardiac Doppler and Color Doppler Indications:    CHF  History:        Patient has prior history of Echocardiogram examinations, most                  recent 01/18/2023. CHF, Previous Myocardial Infarction, Mitral                 Valve Disease; Risk Factors:Diabetes, Dyslipidemia and                 Hypertension.  Sonographer:    Milbert Coulter Referring Phys: [id number] LAURA P CLARK IMPRESSIONS  1. Left ventricular ejection fraction, by estimation, is <20%. The left ventricle has severely decreased function. The left ventricle demonstrate severe global hypokinesis and regional wall motion abnormalities (see scoring diagram/findings for description). The left ventricular internal cavity size was mildly dilated. Left ventricular diastolic parameters are consistent with Grade III diastolic dysfunction (restrictive). Elevated left ventricular end-diastolic pressure. There is akinesis of the left ventricular, mid-apical lateral wall and inferolateral wall. There is akinesis of the left ventricular, apical segment.  2. Right ventricular systolic function is moderately reduced. The right ventricular size is normal. There is severely elevated pulmonary artery systolic pressure. The estimated right ventricular systolic pressure is 72.5 mmHg.  3. Left atrial size was severely dilated.  4. Right atrial size was mildly dilated.  5. Based on eccentricity of MR and akinesis of the inferolateral and lateral walls, suspect this is ischemic MR due to restricted posterior mitral valve leaflet.. The mitral valve is abnormal. Moderate to severe mitral valve regurgitation. No evidence of mitral stenosis.  6. The aortic valve is tricuspid. Aortic valve regurgitation is trivial. Aortic valve sclerosis/calcification is present, without any evidence of aortic stenosis. Aortic valve area, by VTI measures 1.81 cm. Aortic valve mean gradient measures 3.0 mmHg.  Aortic valve Vmax measures 1.15 m/s.  7. The inferior vena cava is dilated in size with <50% respiratory variability, suggesting right atrial pressure of 15 mmHg. FINDINGS  Left Ventricle: Left ventricular ejection fraction, by  estimation, is <20%. The left ventricle has severely decreased function. The left ventricle demonstrates regional wall motion abnormalities. The left ventricular internal cavity size was mildly dilated. There is no left ventricular hypertrophy. Left ventricular diastolic parameters are consistent with Grade III diastolic dysfunction (restrictive). Elevated left ventricular end-diastolic pressure. Right Ventricle: The right ventricular size is normal. No increase in right ventricular wall thickness. Right ventricular systolic function is moderately reduced. There is severely elevated pulmonary artery systolic pressure. The tricuspid regurgitant velocity is 3.79 m/s, and with an assumed right atrial pressure of 15 mmHg, the estimated right ventricular systolic pressure is 72.5 mmHg. Left Atrium: Left atrial size was severely dilated. Right Atrium: Right atrial size was mildly dilated. Pericardium: There is no evidence of pericardial effusion. Mitral Valve: Based on eccentricity of MR and akinesis of the inferolateral and lateral walls, suspect this is ischemic MR due to restricted posterior mitral valve leaflet. The mitral valve is abnormal. Moderate to severe mitral valve regurgitation, with  eccentric posteriorly directed jet. No evidence of mitral valve stenosis. Tricuspid Valve: The tricuspid  valve is normal in structure. Tricuspid valve regurgitation is mild . No evidence of tricuspid stenosis. Aortic Valve: The aortic valve is tricuspid. Aortic valve regurgitation is trivial. Aortic valve sclerosis/calcification is present, without any evidence of aortic stenosis. Aortic valve mean gradient measures 3.0 mmHg. Aortic valve peak gradient measures 5.3 mmHg. Aortic valve area, by VTI measures 1.81 cm. Pulmonic Valve: The pulmonic valve was normal in structure. Pulmonic valve regurgitation is not visualized. No evidence of pulmonic stenosis. Aorta: The aortic root is normal in size and structure. Venous: The  inferior vena cava is dilated in size with less than 50% respiratory variability, suggesting right atrial pressure of 15 mmHg. IAS/Shunts: No atrial level shunt detected by color flow Doppler. Additional Comments: A device lead is visualized.  LEFT VENTRICLE PLAX 2D LVIDd:         5.60 cm      Diastology LVIDs:         5.30 cm      LV e' medial:    4.24 cm/s LV PW:         0.90 cm      LV E/e' medial:  34.0 LV IVS:        1.00 cm      LV e' lateral:   8.49 cm/s LVOT diam:     2.00 cm      LV E/e' lateral: 17.0 LV SV:         38 LV SV Index:   22 LVOT Area:     3.14 cm  LV Volumes (MOD) LV vol d, MOD A2C: 141.0 ml LV vol d, MOD A4C: 115.0 ml LV vol s, MOD A2C: 97.1 ml LV vol s, MOD A4C: 110.0 ml LV SV MOD A2C:     43.9 ml LV SV MOD A4C:     115.0 ml LV SV MOD BP:      22.3 ml RIGHT VENTRICLE RV Basal diam:  3.90 cm RV Mid diam:    3.10 cm RV S prime:     9.36 cm/s TAPSE (M-mode): 1.9 cm LEFT ATRIUM              Index        RIGHT ATRIUM           Index LA diam:        5.30 cm  3.09 cm/m   RA Area:     19.60 cm LA Vol (A2C):   118.0 ml 68.71 ml/m  RA Volume:   50.80 ml  29.58 ml/m LA Vol (A4C):   109.0 ml 63.47 ml/m LA Biplane Vol: 114.0 ml 66.38 ml/m  AORTIC VALVE AV Area (Vmax):    2.33 cm AV Area (Vmean):   2.09 cm AV Area (VTI):     1.81 cm AV Vmax:           115.00 cm/s AV Vmean:          75.600 cm/s AV VTI:            0.212 m AV Peak Grad:      5.3 mmHg AV Mean Grad:      3.0 mmHg LVOT Vmax:         85.40 cm/s LVOT Vmean:        50.300 cm/s LVOT VTI:          0.122 m LVOT/AV VTI ratio: 0.58 MITRAL VALVE                  TRICUSPID  VALVE MV Area (PHT): 5.02 cm       TR Peak grad:   57.5 mmHg MV Decel Time: 151 msec       TR Vmax:        379.00 cm/s MR Peak grad:    131.3 mmHg MR Mean grad:    86.0 mmHg    SHUNTS MR Vmax:         573.00 cm/s  Systemic VTI:  0.12 m MR Vmean:        444.0 cm/s   Systemic Diam: 2.00 cm MR PISA:         4.02 cm MR PISA Eff ROA: 16 mm MR PISA Radius:  0.80 cm MV E velocity:  144.00 cm/s MV A velocity: 50.80 cm/s MV E/A ratio:  2.83 Armanda Magic MD Electronically signed by Armanda Magic MD Signature Date/Time: 04/06/2023/3:50:45 PM    Final    CT ABDOMEN PELVIS WO CONTRAST  Result Date: 04/06/2023 CLINICAL DATA:  Sepsis, known E coli gastroenteritis, lactic acidosis, concern for heart failure. EXAM: CT ABDOMEN AND PELVIS WITHOUT CONTRAST TECHNIQUE: Multidetector CT imaging of the abdomen and pelvis was performed following the standard protocol without IV contrast. RADIATION DOSE REDUCTION: This exam was performed according to the departmental dose-optimization program which includes automated exposure control, adjustment of the mA and/or kV according to patient size and/or use of iterative reconstruction technique. COMPARISON:  03/30/2023. FINDINGS: Lower chest: Heart is enlarged and pacemaker leads are present in the heart. Coronary artery calcifications are present. There is a small right pleural effusion with atelectasis at the lung bases. Hepatobiliary: A stable 1.4 cm hypodensity is noted in the left lobe of the liver, previously characterized of the cyst. No biliary ductal dilatation. Hyperdense material is present in the gallbladder, possible stones, sludge, or excreted contrast Pancreas: Unremarkable. No pancreatic ductal dilatation or surrounding inflammatory changes. Spleen: Normal in size without focal abnormality. Adrenals/Urinary Tract: There is a stable 2.5 cm left adrenal nodule, previously described as probable myelolipoma and not well evaluated on this exam. The right adrenal gland is within normal limits. No renal calculus or hydronephrosis. The bladder is unremarkable. Stomach/Bowel: Stomach is within normal limits. Appendix appears normal. No bowel obstruction, free air or pneumatosis. Scattered diverticula are present along the colon with mild thickening of the walls of the sigmoid colon. Vascular/Lymphatic: Aortic atherosclerosis. No enlarged abdominal or pelvic  lymph nodes. Reproductive: Status post hysterectomy. No adnexal masses. Other: Mesenteric fat stranding is noted with a small amount of free fluid in the presacral space and pelvis. Anasarca is noted. A fat containing hernia is noted in the mid anterior abdomen superior to the umbilicus. Musculoskeletal: Degenerative changes are present in the thoracolumbar spine. No acute osseous abnormality. IMPRESSION: 1. Sigmoid diverticulosis with bowel wall thickening, possible mild diverticulitis. 2. Small right pleural effusion with atelectasis at the lung bases. 3. Anasarca. 4. Small amount of free fluid in the pelvis. 5. Aortic atherosclerosis and coronary artery calcifications. 6. Hyperdense material in the gallbladder, possible stones, sludge, or excreted contrast. 7. Remaining incidental findings as described above. Electronically Signed   By: Thornell Sartorius M.D.   On: 04/06/2023 00:56   DG Chest Port 1 View  Result Date: 04/05/2023 CLINICAL DATA:  Central line adjustment EXAM: PORTABLE CHEST 1 VIEW COMPARISON:  04/05/2023 FINDINGS: Right central line in place with the tip remaining in the right atrium, approximately 4 cm deep to the cavoatrial junction. Left AICD is unchanged. Cardiomegaly with vascular congestion improving bibasilar opacities. IMPRESSION:  Right central line tip remains in the mid right atrium approximately 4 cm deep to the cavoatrial junction. Cardiomegaly, vascular congestion, improving bibasilar opacities. Electronically Signed   By: Charlett Nose M.D.   On: 04/05/2023 22:23   DG Chest Portable 1 View  Result Date: 04/05/2023 CLINICAL DATA:  Central line placement EXAM: PORTABLE CHEST 1 VIEW COMPARISON:  04/05/2023 FINDINGS: Right central line is been placed with the tip in the right atrium. Left AICD remains in place, unchanged. Cardiomegaly, vascular congestion. Bibasilar opacities could reflect atelectasis or edema. No effusions or acute bony abnormality. IMPRESSION: Right central line tip  in the right atrium.  No pneumothorax. Cardiomegaly, vascular congestion bibasilar atelectasis or edema. Electronically Signed   By: Charlett Nose M.D.   On: 04/05/2023 22:22   US Renal  Result Date: 04/05/2023 CLINICAL DATA:  Renal failure. EXAM: RENAL / URINARY TRACT ULTRASOUND COMPLETE COMPARISON:  None Available. FINDINGS: Right Kidney: Renal measurements: 10.2 cm x 4.2 cm x 3.6 cm = volume: 80.88 mL. Echogenicity within normal limits. No mass or hydronephrosis visualized. Left Kidney: Renal measurements: 9.7 cm x 5.6 cm x 5.0 cm = volume: 141.77 mL. Echogenicity within normal limits. No mass or hydronephrosis visualized. Bladder: Empty and subsequently limited in evaluation. Other: Of incidental note is the presence of a right pleural effusion. IMPRESSION: 1. Unremarkable ultrasonographic appearance of the kidneys. 2. Right pleural effusion. Electronically Signed   By: Aram Candela M.D.   On: 04/05/2023 20:12   DG Chest Port 1 View  Result Date: 04/05/2023 CLINICAL DATA:  Abdominal pain, nausea and vomiting, tachypnea EXAM: PORTABLE CHEST 1 VIEW COMPARISON:  01/24/2023 FINDINGS: Single frontal view of the chest demonstrates an enlarged cardiac silhouette. Stable dual lead pacer/AICD. Increasing central vascular congestion, with bilateral perihilar airspace disease. Trace bilateral pleural effusions. No pneumothorax. IMPRESSION: 1. Constellation of findings favoring congestive heart failure and mild pulmonary edema. Superimposed infection cannot be excluded. Electronically Signed   By: Sharlet Salina M.D.   On: 04/05/2023 19:44   VAS [place] LOWER EXTREMITY VENOUS (DVT)  Result Date: 04/01/2023  Lower Venous DVT Study Patient Name:  Natasha Woodard  Date of Exam:   04/01/2023 Medical Rec #: [id number]          Accession #:    [id number] Date of Birth: 13-Oct-1941          Patient Gender: F Patient Age:   78 years Exam Location:  [facility] Procedure:      VAS [place] LOWER EXTREMITY VENOUS (DVT)  Referring Phys: Junious Silk --------------------------------------------------------------------------------  Indications: Edema.  Limitations: Wound vac right groin. Comparison Study: No prior study on file Performing Technologist: Sherren Kerns RVS  Examination Guidelines: A complete evaluation includes B-mode imaging, spectral Doppler, color Doppler, and power Doppler as needed of all accessible portions of each vessel. Bilateral testing is considered an integral part of a complete examination. Limited examinations for reoccurring indications may be performed as noted. The reflux portion of the exam is performed with the patient in reverse Trendelenburg.  +---------+---------------+---------+-----------+----------+-------------------+ RIGHT    CompressibilityPhasicitySpontaneityPropertiesThrombus Aging      +---------+---------------+---------+-----------+----------+-------------------+ CFV                                                   Not well visualized +---------+---------------+---------+-----------+----------+-------------------+ SFJ  Not well visualized +---------+---------------+---------+-----------+----------+-------------------+ FV Prox  Full           No       No                                       +---------+---------------+---------+-----------+----------+-------------------+ FV Mid   Full           Yes      Yes                                      +---------+---------------+---------+-----------+----------+-------------------+ FV DistalFull                                                             +---------+---------------+---------+-----------+----------+-------------------+ PFV      Full                                                             +---------+---------------+---------+-----------+----------+-------------------+ POP      Full           Yes      Yes                                       +---------+---------------+---------+-----------+----------+-------------------+ PTV      Full                                                             +---------+---------------+---------+-----------+----------+-------------------+ PERO     Full                                                             +---------+---------------+---------+-----------+----------+-------------------+   +---------+---------------+---------+-----------+----------+--------------+ LEFT     CompressibilityPhasicitySpontaneityPropertiesThrombus Aging +---------+---------------+---------+-----------+----------+--------------+ CFV      Full           Yes      Yes                                 +---------+---------------+---------+-----------+----------+--------------+ SFJ      Full                                                        +---------+---------------+---------+-----------+----------+--------------+ FV Prox  Full                                                        +---------+---------------+---------+-----------+----------+--------------+  FV Mid   Full           Yes      Yes                                 +---------+---------------+---------+-----------+----------+--------------+ FV DistalFull                                                        +---------+---------------+---------+-----------+----------+--------------+ PFV      Full                                                        +---------+---------------+---------+-----------+----------+--------------+ POP      Full           No       Yes                                 +---------+---------------+---------+-----------+----------+--------------+ PTV      Full                                                        +---------+---------------+---------+-----------+----------+--------------+ PERO     Full                                                         +---------+---------------+---------+-----------+----------+--------------+     Summary: RIGHT: - There is no evidence of deep vein thrombosis in the lower extremity. However, portions of this examination were limited- see technologist comments above.  - No cystic structure found in the popliteal fossa.  LEFT: - There is no evidence of deep vein thrombosis in the lower extremity.  - No cystic structure found in the popliteal fossa.  *See table(s) above for measurements and observations. Electronically signed by Sherald Hess MD on 04/01/2023 at 5:03:56 PM.    Final     Microbiology: Results for orders placed or performed during the hospital encounter of 04/05/23  MRSA Next Gen by PCR, Nasal     Status: None   Collection Time: 04/06/23  3:12 PM   Specimen: Nasal Mucosa; Nasal Swab  Result Value Ref Range Status   MRSA by PCR Next Gen NOT DETECTED NOT DETECTED Final    Comment: (NOTE) The GeneXpert MRSA Assay (FDA approved for NASAL specimens only), is one component of a comprehensive MRSA colonization surveillance program. It is not intended to diagnose MRSA infection nor to guide or monitor treatment for MRSA infections. Test performance is not FDA approved in patients less than 47 years old. Performed at [facility], [address] [place], [place] [address]    *Note: Due to a large number of results and/or encounters for the requested time period, some results have  not been displayed. A complete set of results can be found in Results Review.    Labs: CBC: Recent Labs  Lab 04/25/23 0255 04/27/23 0445 04/28/23 0545  WBC 3.1* 3.4* 4.3  HGB 10.5* 10.9* 11.8*  HCT 34.6* 36.1 39.1  MCV 91.8 92.1 92.2  PLT 245 240 259   Basic Metabolic Panel: Recent Labs  Lab 04/25/23 0255 04/25/23 1931 04/26/23 0555 04/27/23 0445 04/28/23 0545 04/29/23 1135 04/30/23 1019 04/30/23 1700 05/01/23 0420  NA 143 144 144 142 141 139 139 137 135  K 3.2* 4.4 3.6 3.7 4.2 3.2* 3.1* 4.1  3.3*  CL 99 97* 98 95* 91* 88* 87* 84* 84*  CO2 34* 33* 35* 37* 37* 37* 40* 39* 41*  GLUCOSE 109* 89 131* 168* 116* 261* 217* 175* 112*  BUN 39* 37* 34* 32* 29* 29* 28* 29* 27*  CREATININE 2.14* 2.03* 2.01* 1.89* 2.00* 2.12* 2.15* 2.10* 2.02*  CALCIUM 9.3 9.7 9.4 9.7 9.7 9.4 9.7 9.8 9.2  MG 1.9  --  2.2 2.0  --   --   --  3.0* 2.6*  PHOS  --  2.5  --   --   --   --   --   --   --    Liver Function Tests: Recent Labs  Lab 04/25/23 1931  ALBUMIN 3.5   CBG: Recent Labs  Lab 04/30/23 0838 04/30/23 1115 04/30/23 1621 04/30/23 2237 05/01/23 0811  GLUCAP 102* 259* 141* 124* 116*    Discharge time spent: greater than 30 minutes.  Signed: Coralie Keens, MD [facility] 05/01/2023

## 2023-05-01 NOTE — Telephone Encounter (Signed)
HF TOC CM received call from patient she had questions about her medications she should take tonight. Reviewed with patient scheduled medication to take this evening per AVS. Reviewed with patient meds she had that day given by her Unit RN.   Isidoro Donning RN3 CCM, Heart Failure TOC CM [phone]

## 2023-05-01 NOTE — Progress Notes (Signed)
Occupational Therapy Treatment Patient Details Name: Natasha Woodard MRN: [id number] DOB: [birth year] Today's Date: 05/01/2023   History of present illness 82 y.o. female presents to [facility] hospital on 04/20/2023, referred by PCP due to concern fro AKI. Pt was recently admitted 4/24-5/5 for decompensated CHF, AKI, sepsis 2/2 UTI. PMH: NSTEMI and cardiogenic shock with a complicated and prolonged admission 1/30-3/10/24,  HFrEF, A-fib, AKI on CKD IVOSA, HTN, HLD   OT comments  Patient ambulating to bathroom with NT upon arrival. Patient performed bathing seated on toilet with setup and supervision for safety due to lines.  Patient completed dressing seated on EOB and min assist due to IV lines. Patient demonstrating good progress and continues to be appropriate for [facility] for further OT treatment to address independence and safety with self care. Acute OT to continue to follow.    Recommendations for follow up therapy are one component of a multi-disciplinary discharge planning process, led by the attending physician.  Recommendations may be updated based on patient status, additional functional criteria and insurance authorization.    Assistance Recommended at Discharge Intermittent Supervision/Assistance  Patient can return home with the following  A little help with walking and/or transfers;A little help with bathing/dressing/bathroom;Assistance with cooking/housework;Assist for transportation;Help with stairs or ramp for entrance   Equipment Recommendations  None recommended by OT    Recommendations for Other Services      Precautions / Restrictions Precautions Precautions: Fall;Other (comment) Precaution Comments: monitor SpO2 Restrictions Weight Bearing Restrictions: No       Mobility Bed Mobility Overal bed mobility: Modified Independent             General bed mobility comments: OOB upon arrival and left on EOB at end of session    Transfers Overall transfer level: Modified  independent Equipment used: Rollator (4 wheels) Transfers: Sit to/from Stand Sit to Stand: Modified independent (Device/Increase time)           General transfer comment: supervision for toilet transfers due to lines     Balance Overall balance assessment: Needs assistance Sitting-balance support: No upper extremity supported, Feet supported Sitting balance-Leahy Scale: Good     Standing balance support: No upper extremity supported, During functional activity Standing balance-Leahy Scale: Fair Standing balance comment: able to perform peri area bathing while standing with supervision                           ADL either performed or assessed with clinical judgement   ADL Overall ADL's : Needs assistance/impaired     Grooming: Supervision/safety;Standing;Wash/dry hands   Upper Body Bathing: Set up;Sitting Upper Body Bathing Details (indicate cue type and reason): on toilet Lower Body Bathing: Supervison/ safety;Set up Lower Body Bathing Details (indicate cue type and reason): seated/standing from commode Upper Body Dressing : Minimal assistance;Sitting Upper Body Dressing Details (indicate cue type and reason): due to lines     Toilet Transfer: Supervision/safety;Ambulation;Rolling walker (2 wheels) Toilet Transfer Details (indicate cue type and reason): assistance with lines Toileting- Clothing Manipulation and Hygiene: Modified independent;Sitting/lateral lean         General ADL Comments: limited due to lines    Extremity/Trunk Assessment              Vision       Perception     Praxis      Cognition Arousal/Alertness: Awake/alert Behavior During Therapy: WFL for tasks assessed/performed Overall Cognitive Status: No family/caregiver present to determine baseline cognitive functioning  General Comments: pt is HOH,        Exercises      Shoulder Instructions       General Comments       Pertinent Vitals/ Pain       Pain Assessment Pain Assessment: Faces Pain Location: low back (hx arthritis) Pain Descriptors / Indicators: Aching, Grimacing, Sore Pain Intervention(s): Monitored during session  Home Living                                          Prior Functioning/Environment              Frequency  Min 2X/week        Progress Toward Goals  OT Goals(current goals can now be found in the care plan section)  Progress towards OT goals: Progressing toward goals  Acute Rehab OT Goals Patient Stated Goal: go home OT Goal Formulation: With patient Time For Goal Achievement: 04/29/23 Potential to Achieve Goals: Good ADL Goals Pt Will Perform Grooming: with modified independence;standing Pt Will Perform Upper Body Dressing: Independently;sitting;with modified independence Pt Will Perform Lower Body Dressing: with modified independence;sit to/from stand Pt Will Transfer to Toilet: with modified independence;ambulating Pt Will Perform Toileting - Clothing Manipulation and hygiene: with modified independence;sit to/from stand Additional ADL Goal #1: Pt will perform 10+ mins of OOB activity to optimize activity tolerance and promote independence in ADL  Plan Discharge plan remains appropriate    Co-evaluation                 AM-PAC OT "6 Clicks" Daily Activity     Outcome Measure   Help from another person eating meals?: None Help from another person taking care of personal grooming?: A Little Help from another person toileting, which includes using toliet, bedpan, or urinal?: A Little Help from another person bathing (including washing, rinsing, drying)?: A Little Help from another person to put on and taking off regular upper body clothing?: None Help from another person to put on and taking off regular lower body clothing?: A Little 6 Click Score: 20    End of Session Equipment Utilized During Treatment: Rollator (4  wheels);Oxygen  OT Visit Diagnosis: Unsteadiness on feet (R26.81);Muscle weakness (generalized) (M62.81)   Activity Tolerance  (2 liters)   Patient Left in bed;with call bell/phone within reach;with bed alarm set;with family/visitor present   Nurse Communication Mobility status        Time: 1610-9604 OT Time Calculation (min): 27 min  Charges: OT General Charges $OT Visit: 1 Visit OT Treatments $Self Care/Home Management : 23-37 mins  Alfonse Flavors, OTA Acute Rehabilitation Services  Office [phone]   Dewain Penning 05/01/2023, 10:09 AM

## 2023-05-01 NOTE — Progress Notes (Signed)
Daily Progress Note   Patient Name: Natasha Woodard       Date: 05/01/2023 DOB: 1941/09/21  Age: 82 y.o. MRN#: [id number] Attending Physician: Coralie Keens Primary Care Physician: [name], MD Admit Date: 04/20/2023  Reason for Consultation/Follow-up: Establishing goals of care  Subjective: Patient was sitting on edge of bed. She had just finished her breakfast.  Length of Stay: 10  Current Medications: Scheduled Meds:   amiodarone  400 mg Oral BID   apixaban  2.5 mg Oral BID   clopidogrel  75 mg Oral Daily   insulin aspart  0-9 Units Subcutaneous TID WC   polyethylene glycol  17 g Oral BID   rosuvastatin  10 mg Oral Daily   senna-docusate  2 tablet Oral BID   sodium chloride flush  10-40 mL Intracatheter Q12H   sodium chloride flush  10-40 mL Intracatheter Q12H   torsemide  40 mg Oral BID   traMADol  50 mg Oral BID    Continuous Infusions:  milrinone 0.125 mcg/kg/min (05/01/23 0400)    PRN Meds: acetaminophen **OR** acetaminophen, bisacodyl, ondansetron (ZOFRAN) IV, sodium chloride flush, sodium chloride flush  Physical Exam Vitals reviewed.  Constitutional:      Appearance: She is ill-appearing.  HENT:     Mouth/Throat:     Mouth: Mucous membranes are moist.  Pulmonary:     Effort: Pulmonary effort is normal.  Skin:    General: Skin is warm and dry.  Neurological:     Mental Status: She is alert.  Psychiatric:        Mood and Affect: Mood normal.        Behavior: Behavior normal.             Vital Signs: BP 111/66 (BP Location: Left Arm)   Pulse 71   Temp (!) 97.4 F (36.3 C) (Oral)   Resp 18   Wt 64.7 kg   SpO2 92%   BMI 26.08 kg/m  SpO2: SpO2: 92 % O2 Device: O2 Device: Nasal Cannula O2 Flow Rate: O2 Flow Rate (L/min): 2  L/min  Intake/output summary:  Intake/Output Summary (Last 24 hours) at 05/01/2023 1337 Last data filed at 05/01/2023 0400 Gross per 24 hour  Intake 694.84 ml  Output 1 ml  Net 693.84 ml   LBM: Last BM Date :  04/30/23 Baseline Weight: Weight: 73.3 kg Most recent weight: Weight: 64.7 kg       Palliative Assessment/Data:50%      Patient Active Problem List   Diagnosis Date Noted   ICD (implantable cardioverter-defibrillator) in place 04/24/2023   Polymorphic ventricular tachycardia (HCC) 04/24/2023   Coronary artery disease involving native coronary artery of native heart without angina pectoris 04/24/2023   Acute on chronic combined systolic and diastolic CHF (congestive heart failure) (HCC) 04/23/2023   HFrEF (heart failure with reduced ejection fraction) (HCC) 04/22/2023   Pulmonary HTN (HCC) 04/22/2023   Goals of care, counseling/discussion 04/21/2023   Sepsis secondary to UTI (HCC) 04/12/2023   Transaminitis 04/11/2023   Coronary artery disease due to lipid rich plaque 04/10/2023   Pure hypercholesterolemia 04/10/2023   Metabolic acidosis 04/07/2023   Acute exacerbation of CHF (congestive heart failure) (HCC) 04/05/2023   Acute renal failure (HCC) 03/31/2023   Nausea and vomiting 03/27/2023   Bradycardia 03/27/2023   Debility 02/19/2023   Cardiogenic shock (HCC) 02/14/2023   Acute on chronic systolic CHF (congestive heart failure) (HCC) 02/14/2023   Paroxysmal atrial fibrillation (HCC) 02/14/2023   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/14/2023   Acute kidney injury superimposed on chronic kidney disease (HCC) 02/14/2023   Malnutrition of moderate degree 01/28/2023   NSTEMI (non-ST elevated myocardial infarction) (HCC) 01/10/2023   ACS (acute coronary syndrome) (HCC) 01/10/2023   Ischemic cardiomyopathy 01/10/2023   Nonrheumatic mitral valve regurgitation 01/10/2023   Vulvar cysts 12/02/2022   Vaginal candida 12/02/2022   AKI  (acute kidney injury) (HCC) 11/13/2022   ILD (interstitial lung disease) (HCC) 10/11/2022   Abnormal CT scan, kidney 10/11/2022   Bradycardia on ECG 08/31/2022   Superficial fungal infection of skin 03/20/2022   Onychomycosis of multiple toenails with type 2 diabetes mellitus and peripheral angiopathy (HCC) 11/16/2021   Pain due to onychomycosis of toenail of left foot 11/16/2021   Intermittent claudication of both lower extremities due to atherosclerosis (HCC) 08/22/2021   Ganglion cyst of wrist 08/22/2021   Rash 02/17/2021   On amiodarone therapy 08/21/2020   CKD (chronic kidney disease) stage 3, GFR 30-59 ml/min (HCC) 09/25/2019   Gastroesophageal reflux disease 06/19/2019   Health care maintenance 11/14/2018   Joint pain 08/07/2018   Cough 06/17/2018   Sensorineural hearing loss (SNHL), bilateral 03/20/2018   Complex sleep apnea syndrome 10/05/2017   Chronic heart failure with preserved ejection fraction (HFpEF) (HCC) 09/08/2017   Dilated cardiomyopathy (HCC) 06/30/2017   Frequent PVCs 06/30/2017   Bilateral impacted cerumen 07/15/2016   Advance care planning 08/19/2014   Medicare annual wellness visit, subsequent 07/30/2013   Type II diabetes mellitus with peripheral artery disease (HCC) 05/19/2010   Hyperlipidemia associated with type 2 diabetes mellitus (HCC) 05/19/2010   Essential hypertension 05/19/2010   Ichthyosis 05/19/2010    Palliative Care Assessment & Plan   Patient Profile: Natasha Woodard is a 82 y.o. female with a hx of STEMI in Jan 2024 with PCI, cardiogenic shock, hx freq PVCs thought to have added to prior NSTEMI before 2024, CAD, HTN, HLD, DM-2, PAD, CKD-3, OSA, PAF, VT, and ICD placed 01/2023.    The Palliative medicine team has been asked to get involved in the setting of advanced heart failure to have further goals of care conversations.    Assessment: The patient wanted to discuss her day yesterday. She was very frightened when she lost consciousness  and was shocked twice yesterday. She states she never wants to feel like  that again. We discussed that a side effect of the milrinone is heart arrhythmias. We discussed that if her ICD remains activated upon discharge she could potentially receive another shock. She says she would be ok with one or two shocks if she could get some good time with her family at home. However, she would not want to receive several shocks.   Later in the afternoon the patient asked me to return to speak with her husband. He had a much better understanding of big picture this week. He understands that once the milrinone stops making the patient feel better it will be time to transition to hospice. They both understand that she can decide at any time she would like to transition to hospice (as long as she is not still on the milrinone). Answered all of their questions. We discussed the importance of energy conservation once the patient gets home. The patient's husband had some questions about HH-- I messaged both CM and SW to let them know.    Recommendations/Plan: DNAR/DNI Plan for discharge home with milrinone Outpatient palliative Home with ICD active (she is hopeful she will get some time without a shock)  Goals of Care and Additional Recommendations: Limitations on Scope of Treatment: Full Scope Treatment  Code Status:    Code Status Orders  (From admission, onward)           Start     Ordered   04/21/23 0501  Do not attempt resuscitation (DNR)  Continuous       Question Answer Comment  If patient has no pulse and is not breathing Do Not Attempt Resuscitation   If patient has a pulse and/or is breathing: Medical Treatment Goals LIMITED ADDITIONAL INTERVENTIONS: Use medication/IV fluids and cardiac monitoring as indicated; Do not use intubation or mechanical ventilation (DNI), also provide comfort medications.  Transfer to Progressive/Stepdown as indicated, avoid Intensive Care.   Consent: Discussion  documented in EHR or advanced directives reviewed      04/21/23 0502           Code Status History     Date Active Date Inactive Code Status Order ID Comments User Context   04/21/2023 0437 04/21/2023 0502 DNR [id number]  John Giovanni, MD ED   04/05/2023 2121 04/16/2023 2016 DNR [id number]  Kathlene Cote, PA-C ED   04/05/2023 2114 04/05/2023 2121 Full Code [id number]  Kathlene Cote, PA-C ED   03/31/2023 0716 04/03/2023 1557 Full Code [id number]  Russella Dar, NP ED   02/19/2023 1540 02/28/2023 2246 Full Code [id number]  Ranelle Oyster, MD Inpatient   02/19/2023 1540 02/19/2023 1540 Full Code [id number]  Ranelle Oyster, MD Inpatient   01/18/2023 1256 02/19/2023 1530 Full Code [id number]  Sherald Hess, NP Inpatient   01/18/2023 1231 01/18/2023 1256 DNR [id number]  Sherald Hess, NP Inpatient   01/10/2023 1645 01/18/2023 1231 Full Code [id number]  Perlie Gold, PA-C ED   06/30/2017 1456 06/30/2017 2037 Full Code [id number]  Marykay Lex, MD Inpatient      Advance Directive Documentation    Flowsheet Row Most Recent Value  Type of Advance Directive Healthcare Power of Attorney  Pre-existing out of facility DNR order (yellow form or pink MOST form) --  "MOST" Form in Place? --       Prognosis:  < 6 months  Discharge Planning: Home with Palliative Services  Time spent: 75 minutes  Care plan was discussed with Dr. Gala Romney and bedside RN  Thank you for allowing  the Palliative Medicine Team to assist in the care of this patient.    Sherryll Burger, NP  Please contact Palliative Medicine Team phone at [phone] for questions and concerns.

## 2023-05-01 NOTE — TOC CM/SW Note (Addendum)
SATURATION QUALIFICATIONS: (This note is used to comply with regulatory documentation for home oxygen)  Patient Saturations on Room Air at Rest = 87-88%  Patient Saturations on 2 Liters of oxygen while Ambulating = 94%  Please briefly explain why patient needs home oxygen: Heart Failure

## 2023-05-01 NOTE — Care Management Important Message (Signed)
Important Message  Patient Details  Name: Natasha Woodard MRN: [id number] Date of Birth: [birth year]   Medicare Important Message Given:  Yes     Renie Ora 05/01/2023, 10:57 AM

## 2023-05-01 NOTE — Progress Notes (Addendum)
Patient ID: Natasha Woodard, female   DOB: 12/22/1940, 82 y.o.   MRN: [id number]     Advanced Heart Failure Rounding Note  PCP-Cardiologist: Bryan Lemma, MD   Subjective:    5/19 had polymorphic VT 170-200 bpm, w/ LOC x 2 treated w/ ICD shocks. K was 3.1. Milrinone reduced to 0.125. Amio gtt started.   This morning, remains on milrinone 0.125. V paced. No further VT.  K 3.3  Mg 3.3   Co-ox 67%. CVP 9. Scr 2.02 (stable).  Feels ok this morning. Denies resting dyspnea. Continues w/ DOE. Wants to go home. She is DNR. I discussed w/ her deactivation of ICD prior to d/c but she is currently unsure and wants to discuss further w/ MD.    Objective:   Weight Range: 64.7 kg Body mass index is 26.08 kg/m.   Vital Signs:   Temp:  [97.4 F (36.3 C)-98.9 F (37.2 C)] 98.9 F (37.2 C) (05/20 0813) Pulse Rate:  [70-74] 72 (05/20 0813) Resp:  [17-20] 17 (05/20 0813) BP: (91-108)/(55-70) 108/68 (05/20 0813) SpO2:  [90 %-92 %] 92 % (05/20 0417) Weight:  [64.7 kg] 64.7 kg (05/19 0839) Last BM Date : 04/30/23  Weight change: Filed Weights   04/28/23 0044 04/28/23 0441 04/30/23 0839  Weight: 68 kg 68 kg 64.7 kg    Intake/Output:   Intake/Output Summary (Last 24 hours) at 05/01/2023 0823 Last data filed at 05/01/2023 0400 Gross per 24 hour  Intake 694.84 ml  Output 1 ml  Net 693.84 ml     Physical Exam   CVP 9  General:  chronically ill appearing, elderly AAF, No respiratory difficulty HEENT: normal Neck: supple. JVD 9 cm Carotids 2+ bilat; no bruits. No lymphadenopathy or thyromegaly appreciated. Cor: PMI nondisplaced. Regular rate & rhythm. No rubs, gallops or murmurs. Lungs: decreased BS at the bases  Abdomen: soft, nontender, nondistended. No hepatosplenomegaly. No bruits or masses. Good bowel sounds. Extremities: no cyanosis, clubbing, rash, 1+ b/l LE edema + RUE PICC Neuro: alert & oriented x 3, cranial nerves grossly intact. moves all 4 extremities w/o difficulty.  Affect pleasant.   Telemetry   A-paced 37s (personally reviewed)  Runs of PMVT yesterday ~11 AM. No further VT today   EKG    N/A   Labs    CBC No results for input(s): "WBC", "NEUTROABS", "HGB", "HCT", "MCV", "PLT" in the last 72 hours.  Basic Metabolic Panel Recent Labs    95/62/13 1700 05/01/23 0420  NA 137 135  K 4.1 3.3*  CL 84* 84*  CO2 39* 41*  GLUCOSE 175* 112*  BUN 29* 27*  CREATININE 2.10* 2.02*  CALCIUM 9.8 9.2  MG 3.0* 2.6*   Liver Function Tests No results for input(s): "AST", "ALT", "ALKPHOS", "BILITOT", "PROT", "ALBUMIN" in the last 72 hours.  No results for input(s): "LIPASE", "AMYLASE" in the last 72 hours. Cardiac Enzymes No results for input(s): "CKTOTAL", "CKMB", "CKMBINDEX", "TROPONINI" in the last 72 hours.  BNP: BNP (last 3 results) Recent Labs    04/05/23 1820 04/06/23 0058 04/20/23 1758  BNP 2,550.1* 2,843.8* 2,204.9*    ProBNP (last 3 results) Recent Labs    06/09/22 1506  PROBNP 4,638*     D-Dimer No results for input(s): "DDIMER" in the last 72 hours. Hemoglobin A1C No results for input(s): "HGBA1C" in the last 72 hours. Fasting Lipid Panel No results for input(s): "CHOL", "HDL", "LDLCALC", "TRIG", "CHOLHDL", "LDLDIRECT" in the last 72 hours. Thyroid Function Tests No results for input(s): "TSH", "  T4TOTAL", "T3FREE", "THYROIDAB" in the last 72 hours.  Invalid input(s): "FREET3"  Other results:   Imaging    No results found.   Medications:     Scheduled Medications:  apixaban  2.5 mg Oral BID   clopidogrel  75 mg Oral Daily   insulin aspart  0-9 Units Subcutaneous TID WC   polyethylene glycol  17 g Oral BID   potassium chloride  40 mEq Oral Q4H   rosuvastatin  10 mg Oral Daily   senna-docusate  2 tablet Oral BID   sodium chloride flush  10-40 mL Intracatheter Q12H   sodium chloride flush  10-40 mL Intracatheter Q12H   torsemide  40 mg Oral BID   traMADol  50 mg Oral BID    Infusions:  amiodarone  30 mg/hr (05/01/23 0400)   milrinone 0.125 mcg/kg/min (05/01/23 0400)    PRN Medications: acetaminophen **OR** acetaminophen, bisacodyl, ondansetron (ZOFRAN) IV, sodium chloride flush, sodium chloride flush    Patient Profile   Natasha Woodard is a 82 y.o. female with a hx of STEMI in Jan 2024 with PCI, cardiogenic shock, hx freq PVCs thought to have added to prior NSTEMI before 2024, CAD, HTN, HLD, DM-2, PAD, CKD-3, OSA, PAF, and ICD placed 01/2023.    Admitted with recurrent AKI/A/C biventricular HF   Assessment/Plan   1. Acute on chronic systolic CHF/cardiogenic shock: Echo 4/24 with EF < 20%, moderate RV dysfunction, moderate-severe infarct-related MR, IVC dilated. Lactate elevated, AKI.  Suspect recurrent low output HF.  She has been started on milrinone 0.25, plan for palliative home milrinone. She had 2 episodes of PMVT yesterday treated appropriately w/ ICD shocks x 2.  Milrinone rate reduced to 0.125. Co-ox today 67%. CVP 9.  - Continue torsemide 40 mg po bid.  Supp K (3.3) - Continue milrinone 0.125.  With multiple recent admissions with low output HF, we are going to continue palliative home milrinone.  - She is not a candidate for transplant (age) or LVAD (age, elevated creatinine, frailty).  - We discussed hospice care when she goes home.  For now, she wants palliative care followup but not hospice.  - I recommended ICD deactivation prior to d/c. She has not decided on this yet. Will discuss further w/ MD  2. AKI on CKD stage 3: Suspect cardiorenal syndrome.  Creatinine fairly stable 1.89 => 2.0 => 2.12 =>2.02.  - continue milrinone support  3. Mitral regurgitation: Infarct-related MR.  Moderate on 2/24 TEE, mod-severe on 4/24 echo.  Manage medically.  4. CAD: STEMI 1/24 with PLOM treated with DES.  - Continue Plavix, no ASA given apixaban use.  - Continue statin.  5. Atrial fibrillation: Paroxysmal.  Currently V-paced. On amio gtt given recent PMVT  - Transition back to  PO amiodarone today and increase to 400 mg bid  - Continue Eliquis, dosed 2.5 bid for age > 80 and creatinine > 1.5.  6. VT: Has [employer] ICD and on amiodarone.  - 5/19 had polymorphic VT 170-200 bpm, w/ LOC x 2 treated w/ ICD shocks. K was 3.1. Milrinone reduced to 0.125. Amio gtt started - V-paced on tele currently, no further VT  - with end-stage HF and plans for home milrinone, risk of recurrence is high. Dr. Gala Romney to discuss whether or not to deactivate ICD.  - plan to increase amio to 400 mg bid once switched back to oral   Anticipate d/c home today w/ outpatient palliative care support. Dr. Gala Romney to see.   Natasha Woodard  Delmer Islam  05/01/2023 8:23 AM  Patient seen and examined with the above-signed Advanced Practice Provider and/or Housestaff. I personally reviewed laboratory data, imaging studies and relevant notes. I independently examined the patient and formulated the important aspects of the plan. I have edited the note to reflect any of my changes or salient points. I have personally discussed the plan with the patient and/or family.  Remains on milrinone. Failed wean. Had ICD shock over the weekend.   Has met with Palliative Care and would like to try home inotropes for now  Denies CP or SOB.  Co-ox 67% CVP 9  General:  Weak appearing. No resp difficulty HEENT: normal Neck: supple. JVP to jaw Carotids 2+ bilat; no bruits. No lymphadenopathy or thryomegaly appreciated. Cor: Regular rate & rhythm. 2/6 MR Lungs: clear Abdomen: soft, nontender, nondistended. No hepatosplenomegaly. No bruits or masses. Good bowel sounds. Extremities: no cyanosis, clubbing, rash, tr edema Neuro: alert & orientedx3, cranial nerves grossly intact. moves all 4 extremities w/o difficulty. Affect pleasant  Long discussion with her and Palliative team regarding options. She would like to proceed with home inotropic support despite increased risk of ICD firing. If ICD firing is frequent  then would consider shift to comfort care.   Will plan home today on current meds and amio 400 bid. Watch electrolytes,   Arvilla Meres, MD  11:48 AM

## 2023-05-01 NOTE — Progress Notes (Signed)
Physical Therapy Treatment Patient Details Name: Natasha Woodard MRN: [id number] DOB: 1941-02-24 Today's Date: 05/01/2023   History of Present Illness 82 y.o. female presents to [facility] hospital on 04/20/2023, referred by PCP due to concern fro AKI. Pt was recently admitted 4/24-5/5 for decompensated CHF, AKI, sepsis 2/2 UTI. PMH: NSTEMI and cardiogenic shock with a complicated and prolonged admission 1/30-3/10/24,  HFrEF, A-fib, AKI on CKD IVOSA, HTN, HLD    PT Comments    The pt was agreeable to session, focus on endurance training and further assessment of O2 needs with activity. The pt was able to complete all sit-stand transfers, hallway ambulation, and speed-intervals while walking with SpO2 > 90% on RA. The pt did report fatigue after all activities, but all VSS. The pt continues to benefit from UE support for ambulation, but demos good standing balance and ability to reach outside BOS without LOB with only single UE support. Will continue to benefit from skilled PT and follow up therapies to progress endurance and independence with stair navigation in the home.   5X Sit-to-Stand: 15.06 sec (> 14.8 sec indicates increased risk of falls for individuals aged 81-89, > 15 sec indicates increased risk of recurrent falls)   Recommendations for follow up therapy are one component of a multi-disciplinary discharge planning process, led by the attending physician.  Recommendations may be updated based on patient status, additional functional criteria and insurance authorization.  Follow Up Recommendations       Assistance Recommended at Discharge Intermittent Supervision/Assistance  Patient can return home with the following A little help with bathing/dressing/bathroom;Assistance with cooking/housework;Assist for transportation;Help with stairs or ramp for entrance   Equipment Recommendations  None recommended by PT    Recommendations for Other Services       Precautions / Restrictions  Precautions Precautions: Fall;Other (comment) Precaution Comments: monitor SpO2 Restrictions Weight Bearing Restrictions: No     Mobility  Bed Mobility Overal bed mobility: Modified Independent             General bed mobility comments: pt able to move to EOB and back into bed without assist, re-adjusts on her own    Transfers Overall transfer level: Modified independent Equipment used: Rollator (4 wheels), None Transfers: Sit to/from Stand Sit to Stand: Modified independent (Device/Increase time)           General transfer comment: no assist given, supervision for line management only. completed 5x sit-stand in 15 seconds    Ambulation/Gait Ambulation/Gait assistance: Supervision Gait Distance (Feet): 100 Feet (+ 200 ft + 100 ft) Assistive device: Rollator (4 wheels) Gait Pattern/deviations: Step-through pattern, Decreased stride length Gait velocity: Decreased     General Gait Details: supervision, SpO2 remained >92% on RA   Stairs Stairs:  (toe tap/standing march to mimic stair, completed x20 with VSS)              Balance Overall balance assessment: Needs assistance Sitting-balance support: No upper extremity supported, Feet supported Sitting balance-Leahy Scale: Good     Standing balance support: No upper extremity supported, During functional activity Standing balance-Leahy Scale: Fair Standing balance comment: able to stand without UE support, prefers single UE support                            Cognition Arousal/Alertness: Awake/alert Behavior During Therapy: WFL for tasks assessed/performed Overall Cognitive Status: No family/caregiver present to determine baseline cognitive functioning  General Comments: pt is HOH, but able to follow simple commands.        Exercises Other Exercises Other Exercises: 5xsit-stand: 15 seconds    General Comments General comments (skin  integrity, edema, etc.): VSS on RA with exertion      Pertinent Vitals/Pain Pain Assessment Pain Assessment: Faces Pain Score: 0-No pain Faces Pain Scale: No hurt Pain Intervention(s): Monitored during session     PT Goals (current goals can now be found in the care plan section) Acute Rehab PT Goals Patient Stated Goal: to go home PT Goal Formulation: With patient Time For Goal Achievement: 05/05/23 Potential to Achieve Goals: Good Progress towards PT goals: Progressing toward goals    Frequency    Min 2X/week      PT Plan Current plan remains appropriate       AM-PAC PT "6 Clicks" Mobility   Outcome Measure  Help needed turning from your back to your side while in a flat bed without using bedrails?: None Help needed moving from lying on your back to sitting on the side of a flat bed without using bedrails?: None Help needed moving to and from a bed to a chair (including a wheelchair)?: None Help needed standing up from a chair using your arms (e.g., wheelchair or bedside chair)?: None Help needed to walk in hospital room?: A Little Help needed climbing 3-5 steps with a railing? : A Little 6 Click Score: 22    End of Session Equipment Utilized During Treatment: Gait belt Activity Tolerance: Patient tolerated treatment well Patient left: with call bell/phone within reach;in bed;with family/visitor present Nurse Communication: Mobility status PT Visit Diagnosis: Other abnormalities of gait and mobility (R26.89)     Time: 1610-9604 PT Time Calculation (min) (ACUTE ONLY): 47 min  Charges:  $Gait Training: 8-22 mins $Therapeutic Exercise: 38-52 mins                     Vickki Muff, PT, DPT   Acute Rehabilitation Department Office [phone] Secure Chat Communication Preferred   Ronnie Derby 05/01/2023, 2:50 PM

## 2023-05-01 NOTE — Plan of Care (Signed)
  Problem: Education: Goal: Knowledge of General Education information will improve Description: Including pain rating scale, medication(s)/side effects and non-pharmacologic comfort measures Outcome: Adequate for Discharge   Problem: Health Behavior/Discharge Planning: Goal: Ability to manage health-related needs will improve Outcome: Adequate for Discharge   Problem: Clinical Measurements: Goal: Ability to maintain clinical measurements within normal limits will improve Outcome: Adequate for Discharge Goal: Will remain free from infection Outcome: Adequate for Discharge Goal: Diagnostic test results will improve Outcome: Adequate for Discharge Goal: Respiratory complications will improve Outcome: Adequate for Discharge Goal: Cardiovascular complication will be avoided Outcome: Adequate for Discharge   Problem: Activity: Goal: Risk for activity intolerance will decrease Outcome: Adequate for Discharge   Problem: Nutrition: Goal: Adequate nutrition will be maintained Outcome: Adequate for Discharge   Problem: Coping: Goal: Level of anxiety will decrease Outcome: Adequate for Discharge   Problem: Elimination: Goal: Will not experience complications related to bowel motility Outcome: Adequate for Discharge Goal: Will not experience complications related to urinary retention Outcome: Adequate for Discharge   Problem: Pain Managment: Goal: General experience of comfort will improve Outcome: Adequate for Discharge   Problem: Safety: Goal: Ability to remain free from injury will improve Outcome: Adequate for Discharge   Problem: Skin Integrity: Goal: Risk for impaired skin integrity will decrease Outcome: Adequate for Discharge   Problem: Education: Goal: Ability to describe self-care measures that may prevent or decrease complications (Diabetes Survival Skills Education) will improve Outcome: Adequate for Discharge Goal: Individualized Educational Video(s) Outcome:  Adequate for Discharge   Problem: Coping: Goal: Ability to adjust to condition or change in health will improve Outcome: Adequate for Discharge   Problem: Fluid Volume: Goal: Ability to maintain a balanced intake and output will improve Outcome: Adequate for Discharge   Problem: Health Behavior/Discharge Planning: Goal: Ability to identify and utilize available resources and services will improve Outcome: Adequate for Discharge Goal: Ability to manage health-related needs will improve Outcome: Adequate for Discharge   Problem: Metabolic: Goal: Ability to maintain appropriate glucose levels will improve Outcome: Adequate for Discharge   Problem: Nutritional: Goal: Maintenance of adequate nutrition will improve Outcome: Adequate for Discharge Goal: Progress toward achieving an optimal weight will improve Outcome: Adequate for Discharge   Problem: Skin Integrity: Goal: Risk for impaired skin integrity will decrease Outcome: Adequate for Discharge   Problem: Tissue Perfusion: Goal: Adequacy of tissue perfusion will improve Outcome: Adequate for Discharge   Problem: Acute Rehab PT Goals(only PT should resolve) Goal: PT Additional Goal #1 Outcome: Adequate for Discharge Goal: PT Additional Goal #2 Outcome: Adequate for Discharge   Problem: Acute Rehab OT Goals (only OT should resolve) Goal: Pt. Will Perform Grooming Outcome: Adequate for Discharge Goal: Pt. Will Perform Upper Body Dressing Outcome: Adequate for Discharge Goal: Pt. Will Perform Lower Body Dressing Outcome: Adequate for Discharge Goal: Pt. Will Transfer To Toilet Outcome: Adequate for Discharge Goal: Pt. Will Perform Toileting-Clothing Manipulation Outcome: Adequate for Discharge

## 2023-05-01 NOTE — Progress Notes (Addendum)
Pt discharging home today. Medications filled out on AVS, pt to go home on milrinone drip, which is being set up with home health.   Plan for milrinone drip for home to be delivered and pt/family taught today around 4pm per report from pt's primary nurse Onalee Hua.    Annice Needy, RN SWOT

## 2023-05-02 ENCOUNTER — Telehealth: Payer: Self-pay

## 2023-05-02 ENCOUNTER — Telehealth: Payer: Self-pay | Admitting: *Deleted

## 2023-05-02 ENCOUNTER — Telehealth: Payer: Self-pay | Admitting: Family Medicine

## 2023-05-02 DIAGNOSIS — I509 Heart failure, unspecified: Secondary | ICD-10-CM

## 2023-05-02 LAB — CUP PACEART REMOTE DEVICE CHECK
Battery Remaining Longevity: 96 mo
Battery Remaining Percentage: 90 %
Brady Statistic RA Percent Paced: 94 %
Brady Statistic RV Percent Paced: 6 %
Date Time Interrogation Session: 20240520193200
HighPow Impedance: 53 Ohm
Implantable Lead Connection Status: 753985
Implantable Lead Connection Status: 753985
Implantable Lead Implant Date: 20240212
Implantable Lead Implant Date: 20240212
Implantable Lead Location: 753859
Implantable Lead Location: 753860
Implantable Lead Model: 137
Implantable Lead Model: 7841
Implantable Lead Serial Number: 1387421
Implantable Lead Serial Number: 301336
Implantable Pulse Generator Implant Date: 20240212
Lead Channel Impedance Value: 474 Ohm
Lead Channel Impedance Value: 634 Ohm
Lead Channel Pacing Threshold Amplitude: 0.5 V
Lead Channel Pacing Threshold Pulse Width: 0.4 ms
Lead Channel Setting Pacing Amplitude: 2.5 V
Lead Channel Setting Pacing Amplitude: 3.5 V
Lead Channel Setting Pacing Pulse Width: 0.4 ms
Lead Channel Setting Sensing Sensitivity: 0.5 mV
Pulse Gen Serial Number: 241670

## 2023-05-02 NOTE — Transitions of Care (Post Inpatient/ED Visit) (Signed)
   05/02/2023  Name: Natasha Woodard MRN: [id number] DOB: 26-Jun-1941  Today's TOC FU Call Status: Today's TOC FU Call Status:: Unsuccessul Call (1st Attempt) Unsuccessful Call (1st Attempt) Date: 05/02/23  Attempted to reach the patient regarding the most recent Inpatient/ED visit.  Follow Up Plan: Additional outreach attempts will be made to reach the patient to complete the Transitions of Care (Post Inpatient/ED visit) call.   Gean Maidens BSN RN [facility] [phone]

## 2023-05-02 NOTE — Telephone Encounter (Signed)
Left a message on voicemail for patient's family to call the office back.

## 2023-05-02 NOTE — Telephone Encounter (Signed)
Patient's husband notified as instructed by telephone and verbalized understanding. Mr. Brumby stated that home health is there now. Patient's husband was advised that someone with Palliative care will be in touch with him.

## 2023-05-02 NOTE — Telephone Encounter (Signed)
Patient was discharged from hospital with recommendation for Palliative Care. I put in the referral.  Please update patient/family.  I need palliative care to help manage her meds at home.  Thanks.

## 2023-05-02 NOTE — Telephone Encounter (Signed)
(  4:26 pm) PC SW left a message for patient and her husband requesting a call back to discuss the palliative care referral received for Mrs. Duchene.

## 2023-05-04 ENCOUNTER — Telehealth: Payer: Self-pay

## 2023-05-04 ENCOUNTER — Ambulatory Visit: Payer: Medicare Other | Admitting: Internal Medicine

## 2023-05-04 ENCOUNTER — Telehealth: Payer: Self-pay | Admitting: *Deleted

## 2023-05-04 NOTE — Progress Notes (Signed)
POST OPERATIVE OFFICE NOTE    CC:  F/u for surgery  HPI:  Natasha Woodard is a 82 y.o. female who is s/p cardiac catheterization complicated by right femoral pseudoaneurysm.  Initially the pseudoaneurysm was injected with thrombin, however subsequently ruptured.  She required hematoma evacuation and profunda repair by Dr. Karin Lieu on 01/15/2023.  She had a prolonged hospital stay requiring several return trips to the operating room for debridement and wound VAC changes for the right groin.  She was eventually discharged to inpatient rehab and home health was arranged for wound VAC changes.  She was last seen on 03/24/2023 and at that time, her right groin wound was nearly healed.  The remainder of the wound was treated with silver nitrate.  Pt returns today for repeat wound check.  Pt states she has been doing well.  She believes that her right groin wound is completely healed.  She has been in and out of the hospital with CHF exacerbations and acute kidney injuries.  She has just recently returned home.  She denies any claudication, tissue loss, or rest pain of the lower extremities.  She endorses some lower extremity leg swelling, which was also present during her hospitalization.   Allergies  Allergen Reactions   Chlorhexidine Gluconate Rash    Pt refuses CHG due to inflammation and rash   Ezetimibe-Simvastatin Other (See Comments)    REACTION: Muscle aches (side effect)   Lipitor [Atorvastatin] Other (See Comments)    Leg weakness    Claritin [Loratadine]     Possible cause of nightmares.     Spironolactone     Caution re: elevated potassium/creatinine   Tamiflu [Oseltamivir Phosphate] Other (See Comments)    nightmares   Pravastatin Sodium Other (See Comments)    REACTION: Muscle aches (side effect)   Sulfonamide Derivatives Nausea And Vomiting    Current Outpatient Medications  Medication Sig Dispense Refill   acetaminophen (TYLENOL) 325 MG tablet Take 1-2 tablets (325-650 mg  total) by mouth every 6 (six) hours as needed. (Patient taking differently: Take 325-650 mg by mouth in the morning and at bedtime.)     amiodarone (PACERONE) 200 MG tablet Take 2 tablets (400 mg total) by mouth 2 (two) times daily. 120 tablet 0   apixaban (ELIQUIS) 2.5 MG TABS tablet Take 1 tablet (2.5 mg total) by mouth 2 (two) times daily. 60 tablet 1   clopidogrel (PLAVIX) 75 MG tablet Take 1 tablet (75 mg total) by mouth daily. 30 tablet 1   glucose blood (ACCU-CHEK AVIVA PLUS) test strip USE TO TEST BLOOD SUGAR ONCE DAILY 100 strip 2   melatonin 5 MG TABS Take 5 mg by mouth at bedtime as needed (for sleep).     milrinone (PRIMACOR) 20 MG/100 ML SOLN infusion Inject 0.0063 mg/min into the vein continuous.     Multiple Vitamin (MULTIVITAMIN WITH MINERALS) TABS tablet Take 1 tablet by mouth daily.     ondansetron (ZOFRAN-ODT) 4 MG disintegrating tablet Take 1 tablet (4 mg total) by mouth every 8 (eight) hours as needed for nausea or vomiting. 20 tablet 0   rosuvastatin (CRESTOR) 10 MG tablet Take 1 tablet (10 mg total) by mouth daily. TAKE 1 TABLET(10 MG) BY MOUTH DAILY Strength: 10 mg (Patient taking differently: Take 10 mg by mouth daily.) 30 tablet 0   torsemide (DEMADEX) 20 MG tablet Take 2 tablets (40 mg total) by mouth 2 (two) times daily. 120 tablet 0   traMADol (ULTRAM) 50 MG tablet Take 1 tablet (  50 mg total) by mouth 2 (two) times daily as needed for severe pain. 14 tablet 0   triamcinolone cream (KENALOG) 0.1 % Apply 1 Application topically 2 (two) times daily as needed (for itching). (Patient taking differently: Apply 1 Application topically 2 (two) times daily as needed (for itching). Apply to back for itching) 30 g 0   No current facility-administered medications for this visit.     ROS:  See HPI  Physical Exam:   Incision: Right groin incision completely healed without signs of infection or erythema Extremities: Monophasic bilateral DP/PT Doppler signals.  2+ edema of  bilateral lower extremities Neuro: Intact motor and sensation of bilateral lower extremities    Assessment/Plan:  This is a 82 y.o. female who is s/p: Right femoral pseudoaneurysm repair with multiple repeat groin debridements and wound VAC changes in OR  -She returns today for a final wound check.  Her right groin incision is now completely healed. There are no signs of infection -She does have decreased arterial flow in bilateral lower extremities, however she is asymptomatic.  She denies any claudication, rest pain, tissue loss of the lower extremities.  She has monophasic DP/PT Doppler signals bilaterally -She continues to have bilateral lower extremity swelling complicated by her heart failure.  I have recommended she prop her feet up on pillows at home and elevate her legs to help with the swelling -She can follow-up with our office in 1 year with ABIs   Loel Dubonnet, PA-C [facility] [phone]   Clinic MD:  Karin Lieu

## 2023-05-04 NOTE — Transitions of Care (Post Inpatient/ED Visit) (Signed)
05/04/2023  Name: Natasha Woodard MRN: [id number] DOB: [birth year]  Today's TOC FU Call Status: Today's TOC FU Call Status:: Successful TOC FU Call Competed TOC FU Call Complete Date: 05/04/23  Transition Care Management Follow-up Telephone Call Date of Discharge: 05/01/23 Discharge Facility: [facility]) Type of Discharge: Inpatient Admission Primary Inpatient Discharge Diagnosis:: acute kidney injury How have you been since you were released from the hospital?: Better Any questions or concerns?: Yes Patient Questions/Concerns:: Patient is getting confuse on when her appts are. RN went over appts and confirmed dates Patient Questions/Concerns Addressed: Other:  Items Reviewed: Did you receive and understand the discharge instructions provided?: Yes Medications obtained,verified, and reconciled?: Yes (Medications Reviewed) Any new allergies since your discharge?: No Dietary orders reviewed?: No Do you have support at home?: Yes People in Home: spouse Name of Support/Comfort Primary Source: [name]  Medications Reviewed Today: Medications Reviewed Today     Reviewed by Luella Cook, RN (Case Manager) on 05/04/23 at 1612  Med List Status: <None>   Medication Order Taking? Sig Documenting Provider Last Dose Status Informant  acetaminophen (TYLENOL) 325 MG tablet [id number] Yes Take 1-2 tablets (325-650 mg total) by mouth every 6 (six) hours as needed.  Patient taking differently: Take 325-650 mg by mouth in the morning and at bedtime.   Setzer, Lynnell Jude, PA-C Taking Active Self  amiodarone (PACERONE) 200 MG tablet [id number] Yes Take 2 tablets (400 mg total) by mouth 2 (two) times daily. Arrien, York Ram, MD Taking Active   apixaban [facility]) 2.5 MG TABS tablet [id number] Yes Take 1 tablet (2.5 mg total) by mouth 2 (two) times daily. Pokhrel, Laxman, MD Taking Active Self  clopidogrel (PLAVIX) 75 MG tablet [id number] Yes Take 1 tablet (75 mg total) by mouth daily.  Pokhrel, Rebekah Chesterfield, MD Taking Active Self  glucose blood (ACCU-CHEK AVIVA PLUS) test strip [id number] Yes USE TO TEST BLOOD SUGAR ONCE DAILY [name], MD Taking Active Self  melatonin 5 MG TABS [id number] Yes Take 5 mg by mouth at bedtime as needed (for sleep). [provider] Taking Active Self  milrinone (PRIMACOR) 20 MG/100 ML SOLN infusion [id number] Yes Inject 0.0063 mg/min into the vein continuous. Arrien, York Ram, MD Taking Active   Multiple Vitamin (MULTIVITAMIN WITH MINERALS) TABS tablet [id number] Yes Take 1 tablet by mouth daily. Setzer, Lynnell Jude, PA-C Taking Active Self  ondansetron (ZOFRAN-ODT) 4 MG disintegrating tablet [id number] Yes Take 1 tablet (4 mg total) by mouth every 8 (eight) hours as needed for nausea or vomiting. [name], MD Taking Active Self  rosuvastatin (CRESTOR) 10 MG tablet [id number] Yes Take 1 tablet (10 mg total) by mouth daily. TAKE 1 TABLET(10 MG) BY MOUTH DAILY Strength: 10 mg  Patient taking differently: Take 10 mg by mouth daily.   Setzer, Lynnell Jude, PA-C Taking Active Self  torsemide (DEMADEX) 20 MG tablet [id number] Yes Take 2 tablets (40 mg total) by mouth 2 (two) times daily. Arrien, York Ram, MD Taking Active   traMADol Janean Sark) 50 MG tablet [id number] Yes Take 1 tablet (50 mg total) by mouth 2 (two) times daily as needed for severe pain. Arrien, York Ram, MD Taking Active   triamcinolone cream (KENALOG) 0.1 % [id number] Yes Apply 1 Application topically 2 (two) times daily as needed (for itching).  Patient taking differently: Apply 1 Application topically 2 (two) times daily as needed (for itching). Apply to back for itching   [name], MD Taking Active Self  Home Care and Equipment/Supplies: Were Home Health Services Ordered?: Yes Name of Home Health Agency:: [facility] Has Agency set up a time to come to your home?: Yes First Home Health Visit Date: 05/02/23 Any new equipment or medical  supplies ordered?: NA  Functional Questionnaire: Do you need assistance with bathing/showering or dressing?: No Do you need assistance with meal preparation?: Yes Do you need assistance with eating?: No Do you have difficulty maintaining continence: No Do you need assistance with getting out of bed/getting out of a chair/moving?: No Do you have difficulty managing or taking your medications?: No  Follow up appointments reviewed: Specialist Hospital Follow-up appointment confirmed?: Yes Date of Specialist follow-up appointment?: 05/09/23 Follow-Up Specialty Provider:: [id number] [facility], [id number] 10:300[facility] specialty, [id number] 12:15 Dr Ladona Ridgel Heart care Do you need transportation to your follow-up appointment?: No Do you understand care options if your condition(s) worsen?: Yes-patient verbalized understanding  Interventions Today    Flowsheet Row Most Recent Value  General Interventions   General Interventions Discussed/Reviewed Referral to Nurse, Doctor Visits  [RN contacted Davina RN [facility]]  Doctor Visits Discussed/Reviewed Doctor Visits Discussed, Doctor Visits Reviewed      [facility] Interventions Today    Flowsheet Row Most Recent Value  TOC Interventions   TOC Interventions Discussed/Reviewed Arranged PCP follow up within 7 days/Care Guide scheduled, TOC Interventions Discussed, TOC Interventions Reviewed  [RN reviewed appointments]         Gean Maidens BSN RN [facility] [phone]

## 2023-05-04 NOTE — Telephone Encounter (Signed)
Patient scheduled for next week with Dr. Ladona Ridgel 05/11/23.

## 2023-05-04 NOTE — Telephone Encounter (Signed)
Patient missed appointment today with Dr. Ladona Ridgel.  This is post hospital follow up and very important, may need programming adjustments to device as well as discussion around palliative/hospice approach.  Please call patient and get her back in ASAP for evaluation.

## 2023-05-05 ENCOUNTER — Ambulatory Visit: Payer: Medicare Other | Admitting: Physician Assistant

## 2023-05-05 ENCOUNTER — Telehealth: Payer: Self-pay

## 2023-05-05 VITALS — BP 106/65 | HR 70 | Temp 97.3°F | Resp 16 | Ht 62.0 in | Wt 147.0 lb

## 2023-05-05 DIAGNOSIS — I739 Peripheral vascular disease, unspecified: Secondary | ICD-10-CM | POA: Diagnosis not present

## 2023-05-05 DIAGNOSIS — T8189XA Other complications of procedures, not elsewhere classified, initial encounter: Secondary | ICD-10-CM

## 2023-05-05 NOTE — Progress Notes (Signed)
Care Management & Coordination Services Pharmacy Team  Reason for Encounter: Appointment Reminder  Contacted patient to confirm telephone appointment with Al Corpus, PharmD on 05/10/2023 at 3:45.  Unsuccessful outreach. Left voicemail for patient to return call.  Star Rating Drugs:  Medication:  Last Fill: Day Supply Rosuvastatin 10 mg 03/23/2023 90  Care Gaps: Annual wellness visit in last year? Yes 05/12/2022  If Diabetic: Last eye exam / retinopathy screening: Up to date Last diabetic foot exam: Up to date  Al Corpus, PharmD notified  Claudina Lick, [place] Clinical Pharmacy Assistant [phone]

## 2023-05-08 ENCOUNTER — Other Ambulatory Visit: Payer: Self-pay

## 2023-05-08 ENCOUNTER — Encounter (HOSPITAL_COMMUNITY): Payer: Self-pay

## 2023-05-08 ENCOUNTER — Telehealth: Payer: Self-pay | Admitting: Home Health

## 2023-05-08 ENCOUNTER — Emergency Department (HOSPITAL_COMMUNITY)
Admission: EM | Admit: 2023-05-08 | Discharge: 2023-05-08 | Disposition: A | Discharge status: Home / Self Care | Payer: Medicare Other | Attending: Emergency Medicine | Admitting: Emergency Medicine

## 2023-05-08 DIAGNOSIS — Z452 Encounter for adjustment and management of vascular access device: Secondary | ICD-10-CM | POA: Diagnosis not present

## 2023-05-08 DIAGNOSIS — Z7901 Long term (current) use of anticoagulants: Secondary | ICD-10-CM | POA: Diagnosis not present

## 2023-05-08 DIAGNOSIS — I5043 Acute on chronic combined systolic (congestive) and diastolic (congestive) heart failure: Secondary | ICD-10-CM | POA: Diagnosis not present

## 2023-05-08 DIAGNOSIS — Z7902 Long term (current) use of antithrombotics/antiplatelets: Secondary | ICD-10-CM | POA: Diagnosis not present

## 2023-05-08 DIAGNOSIS — I2510 Atherosclerotic heart disease of native coronary artery without angina pectoris: Secondary | ICD-10-CM | POA: Diagnosis not present

## 2023-05-08 DIAGNOSIS — Z79899 Other long term (current) drug therapy: Secondary | ICD-10-CM | POA: Diagnosis not present

## 2023-05-08 MED ORDER — SODIUM CHLORIDE 0.9% FLUSH: 10.0000 mL | INTRAVENOUS | Status: DC | PRN

## 2023-05-08 MED ORDER — SODIUM CHLORIDE 0.9% FLUSH: 10.0000 mL | Freq: Two times a day (BID) | INTRAVENOUS | Status: DC

## 2023-05-08 NOTE — Discharge Instructions (Signed)
You were seen in the emergency department for concerns of your PICC line.  Her PICC line was replaced here in the emergency department.  If you have any recurrence of the having issues with your PICC line, please return to the emergency department or follow-up with your home health team.

## 2023-05-08 NOTE — ED Triage Notes (Signed)
HH sent patient due to picc line being pulled out approx 6inches.  Patient has picc line for mildirone treatment.

## 2023-05-08 NOTE — ED Provider Notes (Signed)
[facility] EMERGENCY DEPARTMENT AT [facility] Provider Note   CSN: [id number] Arrival date & time: 05/08/23  1318     History Chief Complaint  Patient presents with   Vascular Access Problem    Natasha Woodard is a 82 y.o. female.  Patient was seen in the emergency department for vascular access problems.  Patient reports that home health nurse accidentally pulled her PICC line out about 6 inches.  Here to have the PICC line replaced.  HPI     Home Medications Prior to Admission medications   Medication Sig Start Date End Date Taking? Authorizing Provider  acetaminophen (TYLENOL) 325 MG tablet Take 1-2 tablets (325-650 mg total) by mouth every 6 (six) hours as needed. Patient taking differently: Take 325-650 mg by mouth in the morning and at bedtime. 02/28/23   Setzer, Lynnell Jude, PA-C  amiodarone (PACERONE) 200 MG tablet Take 1 tablet (200 mg total) by mouth 2 (two) times daily. ONLY for 6 Weeks; Then: Take 1 tablet ( 200 mg total) by mouth, daily. 05/11/23   Marinus Maw, MD  apixaban (ELIQUIS) 2.5 MG TABS tablet Take 1 tablet (2.5 mg total) by mouth 2 (two) times daily. 04/03/23 04/02/24  Pokhrel, Rebekah Chesterfield, MD  clopidogrel (PLAVIX) 75 MG tablet Take 1 tablet (75 mg total) by mouth daily. 04/03/23 04/02/24  Pokhrel, Rebekah Chesterfield, MD  glucose blood (ACCU-CHEK AVIVA PLUS) test strip USE TO TEST BLOOD SUGAR ONCE DAILY 03/23/23   [name], MD  melatonin 5 MG TABS Take 5 mg by mouth at bedtime as needed (for sleep).    [provider]  milrinone (PRIMACOR) 20 MG/100 ML SOLN infusion Inject 0.0063 mg/min into the vein continuous. 05/01/23   Arrien, York Ram, MD  Multiple Vitamin (MULTIVITAMIN WITH MINERALS) TABS tablet Take 1 tablet by mouth daily. 03/01/23   Setzer, Lynnell Jude, PA-C  ondansetron (ZOFRAN-ODT) 4 MG disintegrating tablet Take 1 tablet (4 mg total) by mouth every 8 (eight) hours as needed for nausea or vomiting. 05/10/23   [name], MD   rosuvastatin (CRESTOR) 10 MG tablet Take 1 tablet (10 mg total) by mouth daily. TAKE 1 TABLET(10 MG) BY MOUTH DAILY Strength: 10 mg 02/28/23   Setzer, Lynnell Jude, PA-C  torsemide (DEMADEX) 20 MG tablet Take 2 tablets (40 mg total) by mouth 2 (two) times daily. Patient taking differently: Take 20 mg by mouth daily. 05/01/23 05/31/23  Arrien, York Ram, MD  traMADol (ULTRAM) 50 MG tablet Take 1 tablet (50 mg total) by mouth 2 (two) times daily as needed for severe pain. 05/01/23   Arrien, York Ram, MD  triamcinolone cream (KENALOG) 0.1 % Apply 1 Application topically 2 (two) times daily as needed (for itching). 04/17/23   [name], MD      Allergies    Chlorhexidine gluconate, Ezetimibe-simvastatin, Lipitor [atorvastatin], Claritin [loratadine], Spironolactone, Tamiflu [oseltamivir phosphate], Pravastatin sodium, and Sulfonamide derivatives    Review of Systems   Review of Systems  Skin:        Concerns with PICC line  All other systems reviewed and are negative.   Physical Exam Updated Vital Signs BP 118/71 (BP Location: Left Arm)   Pulse 70   Temp 98.5 F (36.9 C) (Oral)   Resp 15   Ht 5\' 2"  (1.575 m)   Wt 66.7 kg   SpO2 96%   BMI 26.89 kg/m  Physical Exam Vitals and nursing note reviewed.  HENT:     Head: Normocephalic and atraumatic.  Eyes:     General: No scleral icterus.       Right eye: No discharge.        Left eye: No discharge.  Cardiovascular:     Rate and Rhythm: Normal rate and regular rhythm.  Skin:    General: Skin is warm.     Findings: No bruising, erythema, lesion or rash.     Comments: PICC line initially partially removed.     ED Results / Procedures / Treatments   Labs (all labs ordered are listed, but only abnormal results are displayed) Labs Reviewed - No data to display  EKG None  Radiology No results found.  Procedures Procedures   Medications Ordered in ED Medications - No data to display   ED Course/ Medical  Decision Making/ A&P                           Medical Decision Making  This patient presents to the ED for concern of vascular access problem.  Differential diagnosis includes accidental removal of PICC line, infected PICC line, cellulitis   Problem List / ED Course:  Patient presents to the emergency department with complaints of having PICC line externally reviewed partially removed.  She reports that home health nurse was assisting her earlier today and an axillary pulled on PICC line removing approximately 6 inches from where initially inserted.  No evidence of any infection present at the site on examination including erythema, drainage.  PICC line was reinserted without difficulties.  Patient tolerated without complications.  On reassessment, PICC line appears to be adhered to skin with dressing to prevent accidental removal.  Advised patient to return to the emergency department if she has any signs of infection present at the site.  Patient is agreeable to treatment plan verbalized understanding all return precautions.  All questions answered prior to patient discharge.  Final Clinical Impression(s) / ED Diagnoses Final diagnoses:  Encounter for assessment of peripherally inserted central catheter (PICC)    Rx / DC Orders ED Discharge Orders     None         Smitty Knudsen, PA-C 05/13/23 0910    Loetta Rough, MD 05/20/23 [phone]

## 2023-05-08 NOTE — ED Notes (Signed)
Patient declined vitals and wanted to leave.

## 2023-05-08 NOTE — Progress Notes (Signed)
Peripherally Inserted Central Catheter Placement  The IV Nurse has discussed with the patient and/or persons authorized to consent for the patient, the purpose of this procedure and the potential benefits and risks involved with this procedure.  The benefits include less needle sticks, lab draws from the catheter, and the patient may be discharged home with the catheter. Risks include, but not limited to, infection, bleeding, blood clot (thrombus formation), and puncture of an artery; nerve damage and irregular heartbeat and possibility to perform a PICC exchange if needed/ordered by physician.  Alternatives to this procedure were also discussed.  Bard Power PICC patient education guide, fact sheet on infection prevention and patient information card has been provided to patient /or left at bedside.    PICC Placement Documentation  PICC Single Lumen 05/08/23 Right Brachial 37 cm 0 cm (Active)  Indication for Insertion or Continuance of Line Vasoactive infusions 05/08/23 1750  Exposed Catheter (cm) 0 cm 05/08/23 1750  Site Assessment Clean, Dry, Intact 05/08/23 1750  Line Status Flushed;Saline locked;Blood return noted;Other (Comment) 05/08/23 1750  Dressing Type Transparent;Securing device 05/08/23 1750  Dressing Status Antimicrobial disc in place;Clean, Dry, Intact 05/08/23 1750  Safety Lock Intact 05/08/23 1750  Line Adjustment (NICU/IV Team Only) No 05/08/23 1750  Dressing Intervention New dressing;Other (Comment) 05/08/23 1750  Dressing Change Due 05/15/23 05/08/23 1750       Maximino Greenland 05/08/2023, 6:10 PM

## 2023-05-08 NOTE — Telephone Encounter (Signed)
Home care agency called, patient's PICC line out, they are sending the patient to ER for replacement.

## 2023-05-09 ENCOUNTER — Other Ambulatory Visit: Payer: Self-pay

## 2023-05-09 ENCOUNTER — Other Ambulatory Visit: Payer: Self-pay | Admitting: Family Medicine

## 2023-05-09 ENCOUNTER — Encounter (HOSPITAL_COMMUNITY): Payer: Medicare Other

## 2023-05-10 ENCOUNTER — Encounter: Payer: Self-pay | Admitting: Family Medicine

## 2023-05-10 ENCOUNTER — Ambulatory Visit (INDEPENDENT_AMBULATORY_CARE_PROVIDER_SITE_OTHER): Payer: Medicare Other | Admitting: Family Medicine

## 2023-05-10 ENCOUNTER — Telehealth: Payer: Self-pay

## 2023-05-10 ENCOUNTER — Ambulatory Visit: Payer: Medicare Other | Admitting: Pharmacist

## 2023-05-10 VITALS — BP 102/64 | HR 75 | Temp 97.3°F | Ht 62.0 in | Wt 147.0 lb

## 2023-05-10 DIAGNOSIS — Z66 Do not resuscitate: Secondary | ICD-10-CM | POA: Insufficient documentation

## 2023-05-10 DIAGNOSIS — N179 Acute kidney failure, unspecified: Secondary | ICD-10-CM | POA: Diagnosis not present

## 2023-05-10 DIAGNOSIS — I5032 Chronic diastolic (congestive) heart failure: Secondary | ICD-10-CM | POA: Diagnosis not present

## 2023-05-10 DIAGNOSIS — I509 Heart failure, unspecified: Secondary | ICD-10-CM | POA: Diagnosis not present

## 2023-05-10 DIAGNOSIS — R112 Nausea with vomiting, unspecified: Secondary | ICD-10-CM | POA: Diagnosis not present

## 2023-05-10 DIAGNOSIS — I5043 Acute on chronic combined systolic (congestive) and diastolic (congestive) heart failure: Secondary | ICD-10-CM

## 2023-05-10 LAB — CBC WITH DIFFERENTIAL/PLATELET
Basophils Absolute: 0.1 10*3/uL (ref 0.0–0.1)
Basophils Relative: 1.4 % (ref 0.0–3.0)
Eosinophils Absolute: 0.1 10*3/uL (ref 0.0–0.7)
Eosinophils Relative: 2.2 % (ref 0.0–5.0)
HCT: 38 % (ref 36.0–46.0)
Hemoglobin: 11.9 g/dL — ABNORMAL LOW (ref 12.0–15.0)
Lymphocytes Relative: 17.8 % (ref 12.0–46.0)
Lymphs Abs: 0.7 10*3/uL (ref 0.7–4.0)
MCHC: 31.2 g/dL (ref 30.0–36.0)
MCV: 88.1 fl (ref 78.0–100.0)
Monocytes Absolute: 0.6 10*3/uL (ref 0.1–1.0)
Monocytes Relative: 15 % — ABNORMAL HIGH (ref 3.0–12.0)
Neutro Abs: 2.5 10*3/uL (ref 1.4–7.7)
Neutrophils Relative %: 63.6 % (ref 43.0–77.0)
Platelets: 199 10*3/uL (ref 150.0–400.0)
RBC: 4.32 Mil/uL (ref 3.87–5.11)
RDW: 17.1 % — ABNORMAL HIGH (ref 11.5–15.5)
WBC: 4 10*3/uL (ref 4.0–10.5)

## 2023-05-10 LAB — COMPREHENSIVE METABOLIC PANEL
ALT: 16 U/L (ref 0–35)
AST: 21 U/L (ref 0–37)
Albumin: 3.8 g/dL (ref 3.5–5.2)
Alkaline Phosphatase: 90 U/L (ref 39–117)
BUN: 34 mg/dL — ABNORMAL HIGH (ref 6–23)
CO2: 40 mEq/L — ABNORMAL HIGH (ref 19–32)
Calcium: 10.1 mg/dL (ref 8.4–10.5)
Chloride: 92 mEq/L — ABNORMAL LOW (ref 96–112)
Creatinine, Ser: 2.96 mg/dL — ABNORMAL HIGH (ref 0.40–1.20)
GFR: 14.25 mL/min — CL (ref >60.00)
Glucose, Bld: 137 mg/dL — ABNORMAL HIGH (ref 70–99)
Potassium: 3.9 mEq/L (ref 3.5–5.1)
Sodium: 143 mEq/L (ref 135–145)
Total Bilirubin: 1 mg/dL (ref 0.2–1.2)
Total Protein: 7.1 g/dL (ref 6.0–8.3)

## 2023-05-10 LAB — MAGNESIUM: Magnesium: 1.9 mg/dL (ref 1.5–2.5)

## 2023-05-10 MED ORDER — ONDANSETRON 4 MG PO TBDP
4.0000 mg | ORAL_TABLET | Freq: Three times a day (TID) | ORAL | 2 refills | Status: DC | PRN
Start: 2023-05-10 — End: 2023-05-31

## 2023-05-10 NOTE — Telephone Encounter (Addendum)
I'm covering for PCP Please notify pt - kidney function is worse filtering went from ~25 to 15.  If she's having any worsening loss of appetite, nausea/vomiting, fatigue, somnolence, ro altered mental status, will recommend ER evaluation.  Otherwise will await PCP input tomorrow.

## 2023-05-10 NOTE — Progress Notes (Unsigned)
Care Management & Coordination Services Pharmacy Note  05/10/2023 Name:  Natasha Woodard MRN:  [id number] DOB:  [birth year]  Summary: F/U visit -HF: reviewed recent hospitalizations, pt discharged on palliative milrinone drip; she has PICC line in place but cannot say if medication is currently "running"; pt is taking torsemide 20 mg daily (not 2 tab BID as prescribed) and might be taking furosemide (old Rx), she is not able to get up to look at her bottles right now -She denies increased swelling and did see PCP today who did not note significant edema -Medication mgmt: pt is overall confused about medications - both purpose of meds and changes from hospital; she is not currently using a pill box and using instructions on bottles to take medications for the day  Recommendations/Changes made from today's visit: -Advised to STOP furosemide if she is still taking it; reasonable to continue torsemide 20 mg daily for now (pending results of labwork taken today) -Discussed elevating legs when possible to help with swelling -Advised to discuss amiodarone dose with Dr Ladona Ridgel tomorrow -Advised to use pill box to organize meds - see if [facility] RN can help set this up for her  Follow up plan: -Pharmacist follow up televisit scheduled for 1 month -Cardiology/EP 05/11/23, Nutrition 07/03/23    Subjective: Natasha Woodard is an 82 y.o. year old female who is a primary patient of Para March, Dwana Curd, MD.  The care coordination team was consulted for assistance with disease management and care coordination needs.    Engaged with patient by telephone for follow up visit.  Recent office visits: 05/10/23 Dr Para March OV: hospital f/u - DNR discussed. Try Zofran, take amiodarone today  03/28/23 Dr Para March OV: vomiting x 4 days. Stay off jardiance and metformin. GFR 20 - stop losartan and spironolactone.   03/13/23 Dr Para March OV: f/u - A1c 5.2. GFR 27. Stop metformin and Jardiance. Recheck renal panel 1 week.  Recent  consult visits: 05/05/23 PA Schuh (Vascular): wound check - groin incision healed. Asx PAD, f/u 1 year.  03/27/23 Dr Shearon Stalls (PMR): debility. Rx zofran. Drink 6-8 cups of water per day. HR 35.   03/24/23 PA Rhyne (Vascular): wound check  03/14/23 Dr Wynona Neat (Pulmonary): new pt - OSA. Ordered split night study.  03/09/23 PA Eveland (Vascular): surgical wound f/u - VAC removed. Switch to dry dressing.  Hospital visits: 05/08/23 ED visit [facility]): PICC line re-inserted after accidental pulling out.  04/20/23 - 05/01/23 Admission [facility]): Acute HF, AKI. Discharged with IV milrinone (palliative). Increase amio to 400 mg BID. Increased torsemide to 40 mg BID. Consider comfort care if persistent VT. F/u BMP 1 week (K > 4 and Mg > 2)  04/05/23 - 04/16/23 Admission [facility]): Heart failure, Renal failure. Admitted to CVICU. F/u BMP 1 week, PCP 1 week, resume HH.  03/30/23 - 04/03/23 Admission [facility]): Acute gastroenteritis, viral. AKI improved w/ fluids. Hold torsemide x 2 days before re-starting.  01/10/23 - 02/19/23 Admission [facility]): NSTEMI - ICU stay. Required IABP. R femoral pseudoaneurysm repair, suffered VT arrest, ICD placed. Transferred to CIR 3/10 - 3/19. Stop: carvedilol, aspirin, furosemide, Entresto. Restart Jardiance when infection resolves.   Objective:  Lab Results  Component Value Date   CREATININE 2.96 (H) 05/10/2023   BUN 34 (H) 05/10/2023   GFR 14.25 (LL) 05/10/2023   EGFR 39 (L) 07/07/2022   GFRNONAA 24 (L) 05/01/2023   GFRAA 49 (L) 10/09/2020   NA 143 05/10/2023   K 3.9 05/10/2023   CALCIUM 10.1 05/10/2023  CO2 40 (H) 05/10/2023   GLUCOSE 137 (H) 05/10/2023    Lab Results  Component Value Date/Time   HGBA1C 5.6 03/31/2023 09:30 AM   HGBA1C 5.2 03/13/2023 03:18 PM   GFR 14.25 (LL) 05/10/2023 12:36 PM   GFR 13.48 (LL) 04/20/2023 01:25 PM    Last diabetic Eye exam:  Lab Results  Component Value Date/Time   HMDIABEYEEXA No Retinopathy 10/18/2022 12:00 AM    Last diabetic Foot exam:   Lab Results  Component Value Date/Time   HMDIABFOOTEX yes 01/25/2011 12:00 AM     Lab Results  Component Value Date   CHOL 87 01/11/2023   HDL 33 (L) 01/11/2023   LDLCALC 42 01/11/2023   LDLDIRECT 53 08/10/2009   TRIG 59 01/11/2023   CHOLHDL 2.6 01/11/2023       Latest Ref Rng & Units 05/10/2023   12:36 PM 04/25/2023    7:31 PM 04/20/2023    5:58 PM  Hepatic Function  Total Protein 6.0 - 8.3 g/dL 7.1   7.0   Albumin 3.5 - 5.2 g/dL 3.8  3.5  3.6   AST 0 - 37 U/L 21   30   ALT 0 - 35 U/L 16   86   Alk Phosphatase 39 - 117 U/L 90   158   Total Bilirubin 0.2 - 1.2 mg/dL 1.0   1.0     Lab Results  Component Value Date/Time   TSH 4.765 (H) 04/11/2023 01:27 AM   TSH 1.23 03/19/2021 11:21 AM   TSH 2.148 04/17/2020 03:08 PM   TSH 1.87 11/12/2019 09:21 AM   FREET4 1.32 (H) 04/17/2020 03:08 PM   FREET4 1.28 10/08/2018 11:14 AM       Latest Ref Rng & Units 05/10/2023   12:36 PM 04/28/2023    5:45 AM 04/27/2023    4:45 AM  CBC  WBC 4.0 - 10.5 K/uL 4.0  4.3  3.4   Hemoglobin 12.0 - 15.0 g/dL 09.8  11.9  14.7   Hematocrit 36.0 - 46.0 % 38.0  39.1  36.1   Platelets 150.0 - 400.0 K/uL 199.0  259  240     Lab Results  Component Value Date/Time   VD25OH 60 07/23/2013 10:13 AM   VITAMINB12 298 01/13/2023 08:57 AM    Clinical ASCVD: Yes  The ASCVD Risk score (Arnett DK, et al., 2019) failed to calculate for the following reasons:   The 2019 ASCVD risk score is only valid for ages 45 to 75   The patient has a prior MI or stroke diagnosis    CHA2DS2/VAS Stroke Risk Points  Current as of 4 minutes ago     7 >= 2 Points: High Risk     Points Metrics  1 Has Congestive Heart Failure:  Yes    Current as of 4 minutes ago  1 Has Vascular Disease:  Yes    Current as of 4 minutes ago  1 Has Hypertension:  Yes    Current as of 4 minutes ago  2 Age:  13    Current as of 4 minutes ago  1 Has Diabetes:  Yes    Current as of 4 minutes ago  0 Had Stroke:  No  Had TIA:  No  Had  Thromboembolism:  No    Current as of 4 minutes ago  1 Female:  Yes    Current as of 4 minutes ago       05/10/2023   11:28 AM 04/20/2023   12:31  PM 03/28/2023    8:58 AM  Depression screen PHQ 2/9  Decreased Interest 0 0 1  Down, Depressed, Hopeless 1 0 1  PHQ - 2 Score 1 0 2  Altered sleeping 0 2 1  Tired, decreased energy 1 2 3   Change in appetite 1 2 3   Feeling bad or failure about yourself  0 0 0  Trouble concentrating 0 0 0  Moving slowly or fidgety/restless 0 0 0  Suicidal thoughts 0 0 0  PHQ-9 Score 3 6 9   Difficult doing work/chores Not difficult at all Not difficult at all Not difficult at all       05/10/2023   11:29 AM 04/20/2023   12:32 PM 03/28/2023    8:58 AM  GAD 7 : Generalized Anxiety Score  Nervous, Anxious, on Edge 0 0 1  Control/stop worrying 0 0 0  Worry too much - different things 0 0 1  Trouble relaxing 0 0 0  Restless 0 0 0  Easily annoyed or irritable 0 0 0  Afraid - awful might happen 0 0 0  Total GAD 7 Score 0 0 2  Anxiety Difficulty Not difficult at all Not difficult at all Not difficult at all     Social History   Tobacco Use  Smoking Status Former   Years: 28   Types: Cigarettes   Quit date: 12/12/1993   Years since quitting: 29.4   Passive exposure: Never  Smokeless Tobacco Never   BP Readings from Last 3 Encounters:  05/10/23 102/64  05/08/23 118/71  05/05/23 106/65   Pulse Readings from Last 3 Encounters:  05/10/23 75  05/08/23 70  05/05/23 70   Wt Readings from Last 3 Encounters:  05/10/23 147 lb (66.7 kg)  05/08/23 147 lb (66.7 kg)  05/05/23 147 lb (66.7 kg)   BMI Readings from Last 3 Encounters:  05/10/23 26.89 kg/m  05/08/23 26.89 kg/m  05/05/23 26.89 kg/m    Allergies  Allergen Reactions   Chlorhexidine Gluconate Rash    Pt refuses CHG due to inflammation and rash   Ezetimibe-Simvastatin Other (See Comments)    REACTION: Muscle aches (side effect)   Lipitor [Atorvastatin] Other (See Comments)    Leg  weakness    Claritin [Loratadine]     Possible cause of nightmares.     Spironolactone     Caution re: elevated potassium/creatinine   Tamiflu [Oseltamivir Phosphate] Other (See Comments)    nightmares   Pravastatin Sodium Other (See Comments)    REACTION: Muscle aches (side effect)   Sulfonamide Derivatives Nausea And Vomiting    Medications Reviewed Today     Reviewed by [name], [facility] (Pharmacist) on 05/10/23 at 1646  Med List Status: <None>   Medication Order Taking? Sig Documenting Provider Last Dose Status Informant  acetaminophen (TYLENOL) 325 MG tablet [id number] Yes Take 1-2 tablets (325-650 mg total) by mouth every 6 (six) hours as needed.  Patient taking differently: Take 325-650 mg by mouth in the morning and at bedtime.   Setzer, Lynnell Jude, PA-C Taking Active Self  amiodarone (PACERONE) 200 MG tablet [id number] Yes Take 2 tablets (400 mg total) by mouth 2 (two) times daily.  Patient taking differently: Take 200 mg by mouth daily.   Arrien, York Ram, MD Taking Active   apixaban [facility]) 2.5 MG TABS tablet [id number] Yes Take 1 tablet (2.5 mg total) by mouth 2 (two) times daily. Pokhrel, Rebekah Chesterfield, MD Taking Active Self  clopidogrel (PLAVIX) 75 MG tablet [id number] Yes  Take 1 tablet (75 mg total) by mouth daily. Pokhrel, Rebekah Chesterfield, MD Taking Active Self  glucose blood (ACCU-CHEK AVIVA PLUS) test strip [id number] Yes USE TO TEST BLOOD SUGAR ONCE DAILY [name], MD Taking Active Self  melatonin 5 MG TABS [id number] Yes Take 5 mg by mouth at bedtime as needed (for sleep). [provider] Taking Active Self  milrinone (PRIMACOR) 20 MG/100 ML SOLN infusion [id number] Yes Inject 0.0063 mg/min into the vein continuous. Arrien, York Ram, MD Taking Active   Multiple Vitamin (MULTIVITAMIN WITH MINERALS) TABS tablet [id number] Yes Take 1 tablet by mouth daily. Setzer, Lynnell Jude, PA-C Taking Active Self  ondansetron (ZOFRAN-ODT) 4 MG disintegrating tablet  [id number] Yes Take 1 tablet (4 mg total) by mouth every 8 (eight) hours as needed for nausea or vomiting. [name], MD Taking Active   rosuvastatin (CRESTOR) 10 MG tablet [id number] Yes Take 1 tablet (10 mg total) by mouth daily. TAKE 1 TABLET(10 MG) BY MOUTH DAILY Strength: 10 mg Milinda Antis, PA-C Taking Active Self  torsemide (DEMADEX) 20 MG tablet [id number] Yes Take 2 tablets (40 mg total) by mouth 2 (two) times daily.  Patient taking differently: Take 20 mg by mouth daily.   Arrien, York Ram, MD Taking Active   traMADol Janean Sark) 50 MG tablet [id number] Yes Take 1 tablet (50 mg total) by mouth 2 (two) times daily as needed for severe pain. Arrien, York Ram, MD Taking Active   triamcinolone cream (KENALOG) 0.1 % [id number] Yes Apply 1 Application topically 2 (two) times daily as needed (for itching). [name], MD Taking Active Self            SDOH:  (Social Determinants of Health) assessments and interventions performed: No SDOH Interventions    Flowsheet Row Office Visit from 05/10/2023 in [facility] Visit from 04/20/2023 in [facility] [place] Telephone from 04/17/2023 in [facility] ED to Hosp-Admission (Discharged) from 04/05/2023 in [facility] 4E CV SURGICAL PROGRESSIVE CARE Telephone from 04/04/2023 in [facility] ED to Hosp-Admission (Discharged) from 03/30/2023 in [facility] 25M KIDNEY UNIT  SDOH Interventions        Food Insecurity Interventions -- -- Intervention Not Indicated -- Intervention Not Indicated --  Housing Interventions -- -- Intervention Not Indicated Intervention Not Indicated Intervention Not Indicated --  Transportation Interventions -- -- Intervention Not Indicated -- Intervention Not Indicated Intervention Not Indicated, Inpatient TOC, Patient Resources (Friends/Family)  Depression  Interventions/Treatment  Counseling Counseling -- -- -- --       Medication Assistance: None required.  Patient affirms current coverage meets needs.  Medication Access: Within the past 30 days, how often has patient missed a dose of medication? 0 Is a pillbox or other method used to improve adherence? No  Factors that may affect medication adherence? no barriers identified Are meds synced by current pharmacy? No  Are meds delivered by current pharmacy? No  Does patient experience delays in picking up medications due to transportation concerns? No  Current Rx insurance plan: [facility] Name and location of Current pharmacy:  [id number] [place] Otto, [place] - [address] AT [address] [place] [address] Phone: [phone] Fax: [phone]  [facility] [place], [place] - [address] [place] [address] Phone: [phone] Fax: [phone]  [facility] [address] [place] [address] Phone: [phone] Fax: [phone]   Compliance/Adherence/Medication fill history: Care Gaps: UACR  Star-Rating Drugs: Rosuvastatin - PDC 93%   Assessment/Plan  Hyperlipidemia / PAD (LDL goal < 70) -Controlled-  LDL 42 (12/2022) at goal -New NSTEMI 12/2022, leading to hospitalization w/ VT arrest, ICD placed -Current treatment: Rosuvastatin 10 mg daily - Appropriate, Effective, Safe, Accessible Clopidogrel 75 mg daily - Appropriate, Effective, Safe, Accessible -Previously tried/failed meds: lovastatin (leg cramps) -[facility] -Educated on Cholesterol goals; Benefits of statin for ASCVD risk reduction; Strategies to manage statin-induced myalgias; benefits of aspirin in PAD -Counseled to continued daily exercise routine -Recommend to continue current medication  Diabetes (A1c goal <7%) -Controlled - A1c 5.6% (03/2023) at  goal -Current medications: Testing supplies -Previously tried/failed meds: metformin, Jardiance (GFR < 30) -Educated on A1c and blood sugar goals; Complications of diabetes including kidney damage, retinal damage, and cardiovascular disease; benefits of metformin and Jardiance for DM and CV protection -Recommend to continue current medication  Heart Failure / Hypertension (Goal: BP < 140/90) -Query controlled - pt is taking torsemide 20 mg daily (not 2 tab BID as prescribed) and might be taking furosemide (old Rx), she is not able to get up to look at her bottles right now -Last ejection fraction: 30-35% (Date: 01/18/23) -HF type: HFrEF (EF < 40%); NYHA Class: II-III -Current treatment: Torsemide 20 mg daily - Appropriate, Query Effective, Query Safe Furosemide 20 mg daily ? -Medications previously tried: spironolactone, losartan, Jardiance (GFR < 30) -Educated on Benefits of medications for managing symptoms and prolonging life; Importance of blood pressure control; proper BP monitoring technique -Reviewed importance of daily weights and discussed calling cardiology with wt gain > 3lbs overnight or 5+ lbs in a week -Recommend to continue current medication  Atrial Fibrillation (Goal: prevent stroke and major bleeding) -Controlled -CHADSVASC: 7; Afib Dx in s/o hospitalization for NSTEMI, VT arrest 12/2022 -Current treatment: Amiodarone 200 mg BID - Appropriate, Effective, Safe, Accessible Eliquis 2.5 mg BID - Appropriate, Effective, Safe, Accessible -Counseled on increased risk of stroke due to Afib and benefits of anticoagulation for stroke prevention; avoidance of NSAIDs due to increased bleeding risk with anticoagulants; -Recommended to continue current medication  Vomiting  -Pt reports actively vomiting, she is not able to keep down food or medications; she tried to take ondansetron yesterday but vomited right after so she is afraid to take it this morning; discussed ondansetron likely  did not take effect if she vomited soon after taking it - she may benefit from ODT version so that the drug can be absorbed even if she is vomiting frequently -Ordered ondansetron 4 mg ODT #20 to replace regular tablets in s/o active vomiting  ILD / chronic DOE (Goal: manage symptoms) -Not ideally controlled -Current treatment  None -Medications previously tried: Trelegy, Combivent -Counseled on Differences between maintenance and rescue inhalers - discussed pulmonologist had given Trelegy to see if it would help her chronic DOE, if it does not help she can stop it -Advised trial of Trelegy, monitor breathing symptoms closely for improvement  Health Maintenance -Vaccine gaps: TDAP -discussed benefits/risks of TDAP booster, pt is low risk for TD and would like to forego 10-year booster  Al Corpus, PharmD, P[place]y [place], CPP Clinical Pharmacist Practitioner [facility] [phone]

## 2023-05-10 NOTE — Assessment & Plan Note (Signed)
We talked about her goals of care.  She doesn't want CPR or intubation/ventilation.  She states that if she stopped breathing or if her heart stopped she would not want intervention.  Given her wishes, DNR is appropriate.  Form done and given to patient.  We discussed that if her situation worsened to the point where intubation would potentially have been considered, we can transition her to comfort care.  In the meantime it makes sense to still treat her medical conditions as we would otherwise.

## 2023-05-10 NOTE — Telephone Encounter (Signed)
CRITICAL VALUE:GFR  RECEIVER (on-site recipient of call):Benedict Needy, RN  DATE & TIME NOTIFIED: 05/10/27 4:12p  MESSENGER (representative from lab): Simonne Martinet  MD NOTIFIED: Eustaquio Boyden  TIME OF NOTIFICATION:4:23p

## 2023-05-10 NOTE — Telephone Encounter (Addendum)
To provide update - prior to these lab results, patient told me at 4pm today over the phone that she has been taking torsemide 20 mg - 1 tablet daily (not 2 tab BID as prescribed at discharge). She also might still be taking furosemide (old Rx) but was unable to verify over the phone as she remains confused about her medications.  I advised her to stop furosemide immediately if she is still taking it, and reasonable to continue torsemide 1 tablet daily if her swelling is under control.  Her grandson was on the way over and would be able to help verify her medications, I was planning to follow up with her in the morning.  Defer further recommendations to PCP.

## 2023-05-10 NOTE — Progress Notes (Signed)
Discussed with patient about recent admission and inpatient stay, along with interval events since discharge.    Patient is being discharged with home infusion of IV milrinone, for palliative care.  Increased amiodarone to 400 mg po bid.  In case of recurrent and persistent ventricular tachycardia, may need to transition to comfort care.  Follow up electrolytes and renal function in 7 days, keep K 4 or greater and Mg 2 or greater.  Continue diuresis with torsemide 40 mg bid (increased from 40 mg po daily).     Discharge Diagnoses: Active Problems:   Chronic HFrEF (heart failure with reduced ejection fraction) (HCC)   Acute kidney injury superimposed on chronic kidney disease (HCC)   Coronary artery disease due to lipid rich plaque   Essential hypertension   Paroxysmal atrial fibrillation (HCC)   Resolved Problems:   * No resolved hospital problems. [facility] Course: Natasha Woodard was admitted to the hospital with the working diagnosis of hear failure exacerbation.    Natasha Woodard w/ significant complex medical history including chronic HFrEF (EF less than 20%), moderate to severe MR, CAD status post PCI 12/2022, hypertension, hyperlipidemia, paroxysmal A-fib on Eliquis, history of VT arrest, status post ICD, CKD stage IIIb, pseudoaneurysm of right groin.  recently admitted 4/24-5/5 for decompensated CHF, AKI, and septic shock secondary to UTI who was discharged on torsemide 40 mg daily seen by PCP 5/9 and sent to the ED due to concern for AKI.     Patient complained of shortness of breath exertion since discharge, does not think she has gotten any worse, has been taking torsemide, feels her lower extremity edema has improved.She is not on RAAS inhibition or beta-blocker due to low output heart failure/risk of hypotension and not on SGLT2 inhibitor due to recent groin infection. In the ED, her blood pressure was 136/72, HR 73, RR 18 and 02 saturation 95%, lungs with no wheezing or rales, heart  with S1 and S2 present and rhythmic, abdomen with no distention and positive lower extremity edema.    Na 139, K 3,6 Cl 96, glucose 118, bun 52, cr 3,0  Lactic acid 2,8  Wbc 4,9 hgb 12,8 plt 268    Chest radiograph with cardiomegaly, bilateral hilar vascular congestion, small bilateral pleural effusions. Sternotomy wires in place. Pacemaker defibrillator in place with one atrial and one ventricular lead.    EKG 71 bpm, right axis deviation, right bundle branch block, atrial paced and ventricular sensing, one ventricular paced beat, qtc 461, with no significant ST segment or T wave changes.    05/16 patient was placed on milrinone drip and furosemide drip.  05/17 volume status is improving, transitioned to oral torsemide.   05/18 patient has been stable on milrinone infusion. Plan for possible discharge home in 48 hrs with palliative care home inotropic infusion.  05/19 patient having VT 170 to 200 bpm, loss of consciousness x2, recovering after defibrillator firing.  Changed to IV amiodarone.  05/20 no further VT episodes, patient will go home today with palliative inotropic support, if continue ICD firing may need to transition to comfort care.    Assessment and Plan: Acute on chronic systolic CHF (congestive heart failure) (HCC) Echocardiogram with reduced LV systolic function <20%, mild dilatated LV cavity, RV systolic function preserved, RVSP 72,5 severe dilatation of LA, moderate to severe mitral regurgitation.    Acute on chronic core pulmonale. Pulmonary hypertension.    Patient was placed on aggressive diuresis with furosemide drip and inotropic support with  milrinone, negative fluid balance was achieved, with improvement in her symptoms.  Patient lost 6 kg during this hospitalization.    Monomorphic ventricular tachycardia, patient had 2 episodes of VT, rate 170 to 200 bpm, corrected with defibrillator firing x2.    Amiodarone was reloaded with IV formulation and dose of  milrinone was decreased.    K was corrected with Kcl and will continue oral amiodarone.  Plan to have close follow up as outpatient. If recurrent and persistent VT, may have to transition to comfort care.    Acute kidney injury superimposed on chronic kidney disease (HCC) CKD stage 3b. Hypokalemia.    At the time of her discharge her serum cr was 2,0 with K at 3,3 and serum bicarbonate at 41. Na 135    Added Kcl, and will follow up K levels prior to discharge.    Coronary artery disease due to lipid rich plaque No chest pain.    Essential hypertension Continue blood pressure monitoring . Milrinone for inotropic support, decreased dose due to ventricular tachycardia.     Paroxysmal atrial fibrillation (HCC) Continue anticoagulation.  On amiodarone for rate control.   ======================== She has PICC line in place.  He was reevaluated in the emergency room.  Her main issue at this point is nausea.  She did not know if this was related to amiodarone use. Last took amiodarone last night.  Skipped amiodarone this AM in case it was causing nausea.  Zofran helps some.  Last took zofran last night.  She took 4mg  dose at the OV today-she brought her medications from home.  She still using milrinone drip.  She is taking torsemide at baseline.  Recheck labs are pending.  She does not have chest pain.  Her breathing is improved compared to prior.  She still dealing with lower extremity edema at baseline.    We talked about her goals of care.  She doesn't want CPR or intubation/ventilation.  She states that if she stopped breathing or if her heart stopped she would not want intervention.  Given her wishes, DNR is appropriate.  Form done and given to patient.  We discussed that if her situation worsened to the point where intubation would potentially have been considered, we can transition her to comfort care.  In the meantime it makes sense to still treat her medical conditions as we would  otherwise.  Meds, vitals, and allergies reviewed.   ROS: Per HPI unless specifically indicated in ROS section   [place]ck supple, no LA RRR   Ctab, no focal decrease in breath sounds. Abdomen soft.  Nontender.   1+ BLE Chronic skin changes at baseline. Right upper arm PICC line in place, clean dry and intact.

## 2023-05-10 NOTE — Telephone Encounter (Signed)
Please call pt Thursday AM.  I would hold her next dose of torsemide and try taking it every other day in the meantime.  If she has increase in BLE edema, more SOB, or inc in weight, then I would go back to taking torsemide on a daily basis, at least temporarily.  Overall she may be able to episodically skip a day of torsemide to help maintain her renal function.  I routed this note to cardiology in the meantime.  Thanks.

## 2023-05-10 NOTE — Telephone Encounter (Signed)
Attempted to call pt but did not answer.  Please follow up tomorrow.

## 2023-05-10 NOTE — Telephone Encounter (Signed)
Pt answered during 2nd phone call.  She denies vomiting, poor appetite.  Described fatigue as normal for her.  Her main complaint was diarrhea.  She was able to tell me that she has been taking Torsemide 20mg  1 tab daily and not been taking furosemide at all.  Let pt know that Dr. Para March would follow up tomorrow.  If she had any i worsening loss of appetite, nausea/vomiting, fatigue, somnolence, ro altered mental status, will recommend ER evaluation. Pt verbalized understanding.

## 2023-05-10 NOTE — Assessment & Plan Note (Signed)
Discussed rationale for milrinone use along with torsemide.  Recheck labs pending today.  She has cardiology follow-up pending for tomorrow.  I asked her to try taking 1 tablet of amiodarone this afternoon to see if she can tolerate that without increasing her nausea.  She sounds to be in rhythm and her lungs are clear and at this point it appears that she is okay for outpatient follow-up.  Discussed that I did not expect her kidney function to be normal given her recent history. 40 minutes were devoted to patient care in this encounter (this includes time spent reviewing the patient's file/history, interviewing and examining the patient, counseling/reviewing plan with patient).

## 2023-05-10 NOTE — Patient Instructions (Signed)
I would see if the zofran help with nausea.    Try taking 1 tab of amiodarone later today.   Go to the lab on the way out.   If you have mychart we'll likely use that to update you.     Take care.  Glad to see you.

## 2023-05-11 ENCOUNTER — Ambulatory Visit: Payer: Medicare Other | Attending: Internal Medicine | Admitting: Internal Medicine

## 2023-05-11 ENCOUNTER — Encounter: Payer: Self-pay | Admitting: Internal Medicine

## 2023-05-11 VITALS — BP 120/62 | HR 70 | Ht 62.0 in | Wt 141.6 lb

## 2023-05-11 DIAGNOSIS — I493 Ventricular premature depolarization: Secondary | ICD-10-CM

## 2023-05-11 DIAGNOSIS — I10 Essential (primary) hypertension: Secondary | ICD-10-CM

## 2023-05-11 DIAGNOSIS — I420 Dilated cardiomyopathy: Secondary | ICD-10-CM | POA: Diagnosis not present

## 2023-05-11 DIAGNOSIS — Z95810 Presence of automatic (implantable) cardiac defibrillator: Secondary | ICD-10-CM | POA: Diagnosis not present

## 2023-05-11 MED ORDER — AMIODARONE HCL 200 MG PO TABS
200.0000 mg | ORAL_TABLET | Freq: Two times a day (BID) | ORAL | 3 refills | Status: DC
Start: 2023-05-11 — End: 2023-05-29

## 2023-05-11 NOTE — Progress Notes (Signed)
Remote ICD transmission.   

## 2023-05-11 NOTE — Patient Instructions (Signed)
Medication Instructions:  Your physician has recommended you make the following change in your medication: Medication Dose Change:  Amiodarone 200 mg / See order by Dr. Lewayne Bunting below:  You will-  Take 1 tablet (200 mg total) by mouth 2 (two) times daily. ONLY for 6 Weeks; ( 06/23/2023 )  Then: Take 1 tablet ( 200 mg total) by mouth, daily.   Lab Work: None ordered.  If you have labs (blood work) drawn today and your tests are completely normal, you will receive your results only by: MyChart Message (if you have MyChart) OR A paper copy in the mail If you have any lab test that is abnormal or we need to change your treatment, we will call you to review the results.  Testing/Procedures: None ordered.  Follow-Up: At [facility], you and your health needs are our priority.  As part of our continuing mission to provide you with exceptional heart care, we have created designated Provider Care Teams.  These Care Teams include your primary Cardiologist (physician) and Advanced Practice Providers (APPs -  Physician Assistants and Nurse Practitioners) who all work together to provide you with the care you need, when you need it.  Your next appointment:   Please schedule a follow up appointment in 3 months with Dr Lewayne Bunting.    The format for your next appointment:   In Person  Provider:   Lewayne Bunting, MD{or one of the following Advanced Practice Providers on your designated Care Team:   Francis Dowse, [place] Casimiro Needle "Mardelle Matte" Lanna Poche, [place]  Remote monitoring is used to monitor your ICD from home. This monitoring reduces the number of office visits required to check your device to one time per year. It allows [place] to keep an eye on the functioning of your device to ensure it is working properly. You are scheduled for a device check from home on 8/14. You may send your transmission at any time that day. If you have a wireless device, the transmission will be sent automatically. After your  physician reviews your transmission, you will receive a postcard with your next transmission date.

## 2023-05-11 NOTE — Telephone Encounter (Signed)
Tried speaking with patient on the phone but she could not hear anything I tried to say to her. She asked me to send this note over to her via mychart.   I sent message in [place] message for patient.

## 2023-05-11 NOTE — Progress Notes (Signed)
HPI Ms. Boerema returns today for followup. She is a pleasant 82 yo woman with PMVT, chronic systolic heart failure and sinus node dysfunction s/p DDD ICD. She has been in the hospital with CHF. She was placed on IV milrinone. She feels better. She has developed Stage 3 renal failure. She denies dietary indiscretion. Allergies  Allergen Reactions   Chlorhexidine Gluconate Rash    Pt refuses CHG due to inflammation and rash   Ezetimibe-Simvastatin Other (See Comments)    REACTION: Muscle aches (side effect)   Lipitor [Atorvastatin] Other (See Comments)    Leg weakness    Claritin [Loratadine]     Possible cause of nightmares.     Spironolactone     Caution re: elevated potassium/creatinine   Tamiflu [Oseltamivir Phosphate] Other (See Comments)    nightmares   Pravastatin Sodium Other (See Comments)    REACTION: Muscle aches (side effect)   Sulfonamide Derivatives Nausea And Vomiting     Current Outpatient Medications  Medication Sig Dispense Refill   acetaminophen (TYLENOL) 325 MG tablet Take 1-2 tablets (325-650 mg total) by mouth every 6 (six) hours as needed. (Patient taking differently: Take 325-650 mg by mouth in the morning and at bedtime.)     apixaban (ELIQUIS) 2.5 MG TABS tablet Take 1 tablet (2.5 mg total) by mouth 2 (two) times daily. 60 tablet 1   clopidogrel (PLAVIX) 75 MG tablet Take 1 tablet (75 mg total) by mouth daily. 30 tablet 1   glucose blood (ACCU-CHEK AVIVA PLUS) test strip USE TO TEST BLOOD SUGAR ONCE DAILY 100 strip 2   melatonin 5 MG TABS Take 5 mg by mouth at bedtime as needed (for sleep).     milrinone (PRIMACOR) 20 MG/100 ML SOLN infusion Inject 0.0063 mg/min into the vein continuous.     Multiple Vitamin (MULTIVITAMIN WITH MINERALS) TABS tablet Take 1 tablet by mouth daily.     ondansetron (ZOFRAN-ODT) 4 MG disintegrating tablet Take 1 tablet (4 mg total) by mouth every 8 (eight) hours as needed for nausea or vomiting. 30 tablet 2   rosuvastatin  (CRESTOR) 10 MG tablet Take 1 tablet (10 mg total) by mouth daily. TAKE 1 TABLET(10 MG) BY MOUTH DAILY Strength: 10 mg 30 tablet 0   torsemide (DEMADEX) 20 MG tablet Take 2 tablets (40 mg total) by mouth 2 (two) times daily. (Patient taking differently: Take 20 mg by mouth daily.) 120 tablet 0   traMADol (ULTRAM) 50 MG tablet Take 1 tablet (50 mg total) by mouth 2 (two) times daily as needed for severe pain. 14 tablet 0   triamcinolone cream (KENALOG) 0.1 % Apply 1 Application topically 2 (two) times daily as needed (for itching). 30 g 0   amiodarone (PACERONE) 200 MG tablet Take 1 tablet (200 mg total) by mouth 2 (two) times daily. ONLY for 6 Weeks; Then: Take 1 tablet ( 200 mg total) by mouth, daily. 90 tablet 3   No current facility-administered medications for this visit.     Past Medical History:  Diagnosis Date   AICD (automatic cardioverter/defibrillator) present    Calcific tendonitis 2008   Treatment of left leg   Cardiomyopathy (HCC) 06/2017   a) Echo: EF 25-30%. GR 1-2 DD w/ elevated LVEDP. Mild valvular Dz; b) Cardiac MRI 2/'19: frequent PVCs (diffiuclt to interpret) - EF ~27% w/ diffuse HK.  No evidence of infarct, infiltrative Dz or myocarditis. -- ? if related to PVCs. c) f/u Echo 6/'19: EF 35-40%. Gr 1  DD. Diffuse HK.-> d) Jan 2020 EF 50 to 55%.  Mod MAC, mod LAE.  Ao Sclerosis; e) Echo 1/'21 - EF 60-65%, Gr II DD. Mild Mod MR.    Chronic combined systolic and diastolic CHF, NYHA class 2 and ACA/AHA stage C    Now essentially resolved, back to simply diastolic CHF   Coronary artery disease, non-occlusive    mild-moderate CAD 06/30/17 cath   CTS (carpal tunnel syndrome)    Diabetes mellitus    Type II   Dysrhythmia    patient said that she cant remember what it is   Frequent unifocal PVCs 01/2018   Event Monitor: NSR, W/ frequent multifocal PVCs (9%-down from 30% prior to amiodarone) and PACs.  Nighttime bradycardia suggestive of OSA.';  Zio patch September 2023: PVC burden  now 6.1%.   Hiatal hernia    with reflux   Hyperlipidemia    Statin intolerant   Hypertension    Ichthyosis congenita    Intermittent claudication of both lower extremities due to atherosclerosis (HCC) 08/22/2021   LEA Dopplers 09/09/2021:  Right: Total occlusion noted in the superficial femoral artery. Atherosclerosis noted throughout extremity, see note above. Three vessel runoff.  Left: Total occlusion noted in the superficial femoral artery and/or popliteal artery. Total occlusion noted in the distal anterior tibial artery. Athereosclerosis noted throughout extremity.    NSVD (normal spontaneous vaginal delivery) 1971 & 1972   Obesity    Plantar fasciitis    Left   Presence of permanent cardiac pacemaker    Pulmonary nodule    Imaged multiple times and benign appearing   Sleep apnea    Wears glasses     ROS:   All systems reviewed and negative except as noted in the HPI.   Past Surgical History:  Procedure Laterality Date   ABDOMINAL HYSTERECTOMY  1975   TAH-BSO   ABSCESS DRAINAGE  1972   right breast   APPLICATION OF WOUND VAC Right 01/15/2023   Procedure: APPLICATION OF INCISIONAL WOUND VAC;  Surgeon: Victorino Sparrow, MD;  Location: [facility] OR;  Service: Vascular;  Laterality: Right;   APPLICATION OF WOUND VAC Right 01/26/2023   Procedure: APPLICATION OF WOUND VAC RIGHT GROIN WASHOUT AND SARTORIUS MUSCLE FLAP;  Surgeon: Victorino Sparrow, MD;  Location: [facility] OR;  Service: Vascular;  Laterality: Right;   ARTERY REPAIR Right 01/15/2023   Procedure: RIGHT PROFUNDA ARTERY REPAIR WITH 2000G MYRIAD MORCELLS;  Surgeon: Victorino Sparrow, MD;  Location: [facility] OR;  Service: Vascular;  Laterality: Right;   BREAST BIOPSY Left 01/14/2009   Stereo- Benign   CARDIAC MRI  01/2018   Difficult to interpret 2/2 PVCs. Normal LV size - EF ~27% with diffuse HK. Mild RV dilation - normal fxn. -- NO MYOCARDIAL LGI => no definitive evidence of prior MI, Infiltrative Dz or Myocarditis -- suspect NICM, possibly  related to PVCs.   CARPAL TUNNEL RELEASE Right 07/02/2014   Procedure: RIGHT CARPAL TUNNEL RELEASE;  Surgeon: Nicki Reaper, MD;  Location: [facility];  Service: Orthopedics;  Laterality: Right;   CARPAL TUNNEL RELEASE Left 06/11/2015   Procedure: LEFT CARPAL TUNNEL RELEASE;  Surgeon: Cindee Salt, MD;  Location: [facility];  Service: Orthopedics;  Laterality: Left;  REGIONAL/FAB   COLONOSCOPY     corn removal  1968   right   CORONARY STENT INTERVENTION N/A 01/10/2023   Procedure: CORONARY STENT INTERVENTION;  Surgeon: Lennette Bihari, MD;  Location: [facility] INVASIVE CV LAB;  Service: Cardiovascular;  Laterality: N/A;   GROIN DEBRIDEMENT Right 02/06/2023   Procedure: RIGHT GROIN DEBRIDEMENT WITH WOUND VAC CHANGE;  Surgeon: Victorino Sparrow, MD;  Location: [facility] OR;  Service: Vascular;  Laterality: Right;   HEMATOMA EVACUATION Right 01/15/2023   Procedure: EVACUATION HEMATOMA RIGHT GROIN;  Surgeon: Victorino Sparrow, MD;  Location: [facility] OR;  Service: Vascular;  Laterality: Right;   Holter Monitor  06/2017   ~17,000 PVC beats - majority were singlets, some couplets. 44 brief 3-7 beat runs of NSVT. Also noted were less frequent PACs with 11 runs (longest 15 beats)   IABP INSERTION Right 01/10/2023   Procedure: IABP Insertion;  Surgeon: Lennette Bihari, MD;  Location: [facility] INVASIVE CV LAB;  Service: Cardiovascular;  Laterality: Right;   ICD IMPLANT N/A 01/23/2023   Procedure: ICD IMPLANT;  Surgeon: Marinus Maw, MD;  Location: [facility] INVASIVE CV LAB;  Service: Cardiovascular;  Laterality: N/A;   INCISION AND DRAINAGE OF WOUND Right 01/31/2023   Procedure: IRRIGATION AND DEBRIDEMENT RIGHT GROIN WOUND AND WOUND VAC CHANGE;  Surgeon: Victorino Sparrow, MD;  Location: [facility] OR;  Service: Vascular;  Laterality: Right;   OPEN REDUCTION INTERNAL FIXATION (ORIF) DISTAL RADIAL FRACTURE Left 07/21/2017   Procedure: OPEN REDUCTION INTERNAL FIXATION (ORIF) LEFT DISTAL RADIAL FRACTURE;  Surgeon: Sheral Apley, MD;  Location: [facility] OR;  Service: Orthopedics;  Laterality: Left;   RIGHT/LEFT HEART CATH AND CORONARY ANGIOGRAPHY N/A 06/30/2017   Procedure: Right/Left Heart Cath and Coronary Angiography;  Surgeon: Marykay Lex, MD;  Location: [facility] INVASIVE CV LAB:  pRCA 55%, pCx 40%, OM1 45%, mCx 50%, D2 50% - LVEF 25-35%. Moderately elevated LVEDP(26 mmHg with PCWP 16 mmHg).  FICK CO/CI: 4.47/2.48. PA pressures 47/14 mmHg with a mean of 27 mm.   RIGHT/LEFT HEART CATH AND CORONARY ANGIOGRAPHY N/A 01/10/2023   Procedure: RIGHT/LEFT HEART CATH AND CORONARY ANGIOGRAPHY;  Surgeon: Lennette Bihari, MD;  Location: [facility] INVASIVE CV LAB;  Service: Cardiovascular;  Laterality: N/A;   ROTATOR CUFF REPAIR     left shoulder   TEMPORARY PACEMAKER N/A 01/19/2023   Procedure: TEMPORARY PACEMAKER;  Surgeon: Dolores Patty, MD;  Location: [facility] INVASIVE CV LAB;  Service: Cardiovascular;  Laterality: N/A;   TRANSTHORACIC ECHOCARDIOGRAM  06/2017   EF 25 and 30%. GR 1 DD. Mild diastolic dysfunction with elevated LVEDP. Mild valvular disease.   TRANSTHORACIC ECHOCARDIOGRAM  11'18, 6/'19    a) EF remains 30-35%.  Diffuse hypokinesis noted.  Severe LA dilation; b) Improved EF 35-40%.  Diffuse HK.  GR 1 DD.  Moderate TR.  Mild RV dilation.   TRANSTHORACIC ECHOCARDIOGRAM  01/28/2022   Follow-up echo: EF 50 to 55%.  No RWMA.  GRII DD.  Severe LA dilation.  Mild RA dilation with normal PAP and RAP.  Mild to moderate TR (no significant change noted)   Zio patch  08/2022   3-day: Currently NSR (HR range 47-91 with average 64 bpm) 1 F AVB with IVCD/BBB.  Rare isolated PACs (<1%).  Frequent PVCs (6.1% with rare couplets and triplets.  Also bigeminy.  2 atrial runs 8 beats 5 2 seconds.  No sustained arrhythmias.   ZIO Patch 14 d Event Monitor  10/2018   Results as below < 1% PVC.  (Notably improved after starting amiodarone)     Family History  Problem Relation Age of Onset   Heart disease Mother        s/p pacemaker    Diabetes Mother    Heart disease Father  Diabetes Father    Heart disease Brother        MI   Cancer Brother        Lung   Hypertension Other    Colon cancer Neg Hx    Breast cancer Neg Hx      Social History   Socioeconomic History   Marital status: Married    Spouse name: Not on file   Number of children: 2   Years of education: Not on file   Highest education level: Bachelor's degree (e.g., BA, AB, BS)  Occupational History    Employer: RETIRED  Tobacco Use   Smoking status: Former    Years: 28    Types: Cigarettes    Quit date: 12/12/1993    Years since quitting: 29.4    Passive exposure: Never   Smokeless tobacco: Never  Vaping Use   Vaping Use: Never used  Substance and Sexual Activity   Alcohol use: No    Alcohol/week: 0.0 standard drinks of alcohol   Drug use: No   Sexual activity: Never  Other Topics Concern   Not on file  Social History Narrative   Two kids, two grandchildren, two great grandchildren.    Married 1969   Retired.    Social Determinants of Health   Financial Resource Strain: Low Risk  (03/27/2023)   Overall Financial Resource Strain (CARDIA)    Difficulty of Paying Living Expenses: Not very hard  Food Insecurity: No Food Insecurity (04/17/2023)   Hunger Vital Sign    Worried About Running Out of Food in the Last Year: Never true    Ran Out of Food in the Last Year: Never true  Transportation Needs: No Transportation Needs (04/17/2023)   PRAPARE - Administrator, Civil Service (Medical): No    Lack of Transportation (Non-Medical): No  Physical Activity: Sufficiently Active (03/27/2023)   Exercise Vital Sign    Days of Exercise per Week: 4 days    Minutes of Exercise per Session: 40 min  Stress: No Stress Concern Present (03/27/2023)   [employer] of Occupational Health - Occupational Stress Questionnaire    Feeling of Stress : Only a little  Social Connections: Unknown (03/27/2023)   Social Connection and Isolation  Panel [NHANES]    Frequency of Communication with Friends and Family: More than three times a week    Frequency of Social Gatherings with Friends and Family: Twice a week    Attends Religious Services: More than 4 times per year    Active Member of [employer] or Organizations: Not on file    Attends Banker Meetings: Not on file    Marital Status: Married  Intimate Partner Violence: Not At Risk (04/06/2023)   Humiliation, Afraid, Rape, and Kick questionnaire    Fear of Current or Ex-Partner: No    Emotionally Abused: No    Physically Abused: No    Sexually Abused: No     BP 120/62   Pulse 70   Ht 5\' 2"  (1.575 m)   Wt 141 lb 9.6 oz (64.2 kg)   SpO2 98%   BMI 25.90 kg/m   Physical Exam:  Well appearing NAD HEENT: Unremarkable Neck:  No JVD, no thyromegally Lymphatics:  No adenopathy Back:  No CVA tenderness Lungs:  Clear HEART:  Regular rate rhythm, no murmurs, no rubs, no clicks Abd:  soft, positive bowel sounds, no organomegally, no rebound, no guarding Ext:  2 plus pulses, no edema, no cyanosis, no clubbing Skin:  No rashes no nodules Neuro:  CN II through XII intact, motor grossly intact  EKG - nsr with atrial pacing  DEVICE  Normal device function.  See PaceArt for details.   Assess/Plan: Chronic systolic heart failure - she is now on IV milrinone. Continue. Her symptoms are class 3A. ICD - her atrial threshold is elevated. We have increased the output. VT - she will continue amio. I asked her to take 400 mg daily for another 6 weeks. Additional decreases after. Sinus node dysfunction - she comes in around 40/min. She is mostly atrial pacing.  Sharlot Gowda Malajah Oceguera,MD

## 2023-05-12 ENCOUNTER — Telehealth: Payer: Self-pay

## 2023-05-12 NOTE — Telephone Encounter (Signed)
Transition Care Management Follow-up Telephone Call Date of discharge and from where: [facility] 5/27 How have you been since you were released from the hospital? ok Any questions or concerns? No  Items Reviewed: Did the pt receive and understand the discharge instructions provided? Yes  Medications obtained and verified? Yes  Other? No  Any new allergies since your discharge? No  Dietary orders reviewed? No Do you have support at home? Yes    Follow up appointments reviewed:  PCP Hospital f/u appt confirmed? Yes  Scheduled to see 5/30 on  @ . Specialist Hospital f/u appt confirmed? No  Scheduled to see  on  @. Are transportation arrangements needed? No  If their condition worsens, is the pt aware to call PCP or go to the Emergency Dept.? Yes Was the patient provided with contact information for the PCP's office or ED? Yes Was to pt encouraged to call back with questions or concerns? Yes

## 2023-05-14 ENCOUNTER — Telehealth: Payer: Self-pay | Admitting: Family Medicine

## 2023-05-14 DIAGNOSIS — I5032 Chronic diastolic (congestive) heart failure: Secondary | ICD-10-CM

## 2023-05-14 DIAGNOSIS — I10 Essential (primary) hypertension: Secondary | ICD-10-CM

## 2023-05-14 NOTE — Telephone Encounter (Signed)
Please check with patient and see how she is doing with attempting torsemide every other day.  Please let me know about her fluid status, how often she is taking torsemide, etc.  We need to recheck a BMET, either at a lab visit or an office visit.  I put in the order in the meantime.  If she wants to get lab visit done in [place] then that is fine with me.

## 2023-05-15 ENCOUNTER — Other Ambulatory Visit: Payer: Self-pay

## 2023-05-15 ENCOUNTER — Encounter (HOSPITAL_BASED_OUTPATIENT_CLINIC_OR_DEPARTMENT_OTHER): Payer: Medicare Other | Admitting: Pulmonary Disease

## 2023-05-15 DIAGNOSIS — Z79899 Other long term (current) drug therapy: Secondary | ICD-10-CM | POA: Diagnosis not present

## 2023-05-15 DIAGNOSIS — I5043 Acute on chronic combined systolic (congestive) and diastolic (congestive) heart failure: Secondary | ICD-10-CM | POA: Diagnosis not present

## 2023-05-15 DIAGNOSIS — I739 Peripheral vascular disease, unspecified: Secondary | ICD-10-CM

## 2023-05-15 DIAGNOSIS — I2510 Atherosclerotic heart disease of native coronary artery without angina pectoris: Secondary | ICD-10-CM | POA: Diagnosis not present

## 2023-05-16 ENCOUNTER — Telehealth: Payer: Self-pay | Admitting: Family Medicine

## 2023-05-16 ENCOUNTER — Telehealth (HOSPITAL_COMMUNITY): Payer: Self-pay | Admitting: Cardiology

## 2023-05-16 ENCOUNTER — Ambulatory Visit: Payer: Self-pay

## 2023-05-16 MED ORDER — ROSUVASTATIN CALCIUM 10 MG PO TABS: 10.0000 mg | ORAL_TABLET | Freq: Every day | ORAL | 3 refills | Status: DC

## 2023-05-16 MED ORDER — APIXABAN 2.5 MG PO TABS: 2.5000 mg | ORAL_TABLET | Freq: Two times a day (BID) | ORAL | 3 refills | Status: DC

## 2023-05-16 MED ORDER — CLOPIDOGREL BISULFATE 75 MG PO TABS: 75.0000 mg | ORAL_TABLET | Freq: Every day | ORAL | 3 refills | Status: DC

## 2023-05-16 NOTE — Patient Instructions (Signed)
Visit Information  Thank you for taking time to visit with me today. Please don't hesitate to contact me if I can be of assistance to you.   Following are the goals we discussed today:  Send Mychart message to PCP regarding your question regarding fluid restriction, need for nephrology referral and concerns regarding compression hose and bilateral knee swelling.    Our next appointment is by telephone on 06/19/23 at 3 pm  Please call the care guide team at [phone] if you need to cancel or reschedule your appointment.   If you are experiencing a Mental Health or Behavioral Health Crisis or need someone to talk to, please call the Suicide and Crisis Lifeline: 988 call 1-800-273-TALK (toll free, 24 hour hotline)  Patient verbalizes understanding of instructions and care plan provided today and agrees to view in MyChart. Active MyChart status and patient understanding of how to access instructions and care plan via MyChart confirmed with patient.     George Ina RN,BSN,CCM [facility] Care Coordination [phone] direct line

## 2023-05-16 NOTE — Telephone Encounter (Signed)
Amiodarone was given by Cardiology and other meds by the hospital. Okay to refill?

## 2023-05-16 NOTE — Telephone Encounter (Signed)
Prescription Request  05/16/2023  LOV: 05/10/2023  What is the name of the medication or equipment? clopidogrel (PLAVIX) 75 MG tablet   apixaban (ELIQUIS) 2.5 MG TABS tablet   amiodarone (PACERONE) 200 MG tablet   rosuvastatin (CRESTOR) 10 MG tablet    Have you contacted your pharmacy to request a refill? No   Which pharmacy would you like this sent to?  [id number] - [place], [place] - [address] AT [address] [place] [address] Phone: [phone] Fax: [phone]   Patient notified that their request is being sent to the clinical staff for review and that they should receive a response within 2 business days.   Please advise at Mobile [phone] (mobile)

## 2023-05-16 NOTE — Addendum Note (Signed)
Addended by: [name] on: 05/16/2023 12:00 PM   Modules accepted: Orders

## 2023-05-16 NOTE — Telephone Encounter (Signed)
I sent the rx for eliquis, plavix, and crestor.   I need the amiodarone rx to come through cardiology.   I routed this for input.  Please see other note about fluid status/labs.  Thanks.

## 2023-05-16 NOTE — Telephone Encounter (Signed)
Pt also wanted to ask Dr. Para March, since she has CKD, how much water is suggested she drinks per day? Call back # [phone]

## 2023-05-16 NOTE — Telephone Encounter (Signed)
Spoke with patient and she is taking torsemide 1 tablet BID; she never did the every other day as advised on 05/11/23. Patient stated that she must of misplaced the paper she wrote everything down on when we talked then. She stated that she still has a bit of nausea but not like before and has no appetite. She has been drinking about 1 liter a day and wants to know how much she should be drinking? Also, when does labs or OV need to be done?

## 2023-05-16 NOTE — Patient Outreach (Signed)
  Care Coordination   Follow Up Visit Note   05/16/2023 Name: Natasha Woodard MRN: [id number] DOB: Feb 22, 1941  Natasha Woodard is a 82 y.o. year old female who sees [name], MD for primary care. I spoke with  [name] by phone today.  What matters to the patients health and wellness today?   Per chart review patient recently discharged from hospital on 05/01/23 to home with PICC line and palliative IV milrinone.  Patient states nursing services provided by [facility].  Patient  reports ED visit on 05/08/23 due to issues/ concerns with PICC line. Patient states she is at baseline with SOB.  She reports swelling around knee areas when wearing compression.  Patient states she doesn't have much of an appetite since home from the hospital.  Patient reports weight today is 145 lbs. Patient wants to know if she needs follow up with a nephrologist due to concerns regarding kidney healthy.  Patient states she is using pill box now.      Goals Addressed             This Visit's Progress    post hospital follow up/ education / management of health conditions.       Interventions Today    Flowsheet Row Most Recent Value  Chronic Disease   Chronic disease during today's visit Congestive Heart Failure (CHF)  General Interventions   General Interventions Discussed/Reviewed General Interventions Reviewed, Doctor Visits  [evaluation of current treatment plan for HF and patients adherence to plan as established by provider. Verbally assessed for LE swelling. Assessed for weight and increase SOB.]  Doctor Visits Discussed/Reviewed Doctor Visits Reviewed  Annabell Sabal upcoming provider visits. Advised to discuss need for nephrology referral with PCP.]  Education Interventions   Education Provided Provided Education  [Advised to notify home health nurse/ provider for concerns regarding PICC line. Discussed signs of infection. Discussed importance of using pill box for medication  management.]  Nutrition Interventions   Nutrition Discussed/Reviewed Nutrition Reviewed, Portion sizes  [Advised to send message to provider in Mychart regarding clarity on fluid restriction. Advised to avoid food that can cause nausea: fatty, greasy, fried food/ spicey. Eat small, frequent meals]  Pharmacy Interventions   Pharmacy Dicussed/Reviewed Pharmacy Topics Reviewed  [medications reviewed and compliance discussed. Discused concerns about torsemide. Confirmed patient states she is waiting to hear back from provider office regarding torsemide dosage. Per chart review patient home on palliative milrinone.]              SDOH assessments and interventions completed:  No     Care Coordination Interventions:  Yes, provided   Follow up plan: Follow up call scheduled for 06/19/23    Encounter Outcome:  Pt. Visit Completed   George Ina RN,BSN,CCM [facility] Care Coordination [phone] direct line

## 2023-05-16 NOTE — Telephone Encounter (Signed)
ABNORMAL LABS RECEIVED FROM PCP LABS DRAWN 05/15/23 CR 3.03 BUN 33 K 3.4  PER JESSICA MILFORD,NP HOLD TORSEMIDE X 4 DAYS RETURN FOR FOLLOW UP   HFU AVAILABLE 6/5 @ 1130 PT AWARE VIA DON

## 2023-05-16 NOTE — Telephone Encounter (Signed)
LMTCB

## 2023-05-16 NOTE — Telephone Encounter (Signed)
I would recheck labs later this week, when possible.  If she has still been taking torsemide BID in the meantime, then I would continue as is for now (especially if she is going to have labs done in the near future).  That way we can see about her renal function with continued BID dosing . We may need to adjust her dose based on the lab results.    1 liter is likely reasonable target but she'll need to drink enough to keep her urine clear or light colored.  Thanks.

## 2023-05-17 ENCOUNTER — Other Ambulatory Visit (HOSPITAL_COMMUNITY): Payer: Self-pay | Admitting: Cardiology

## 2023-05-17 ENCOUNTER — Encounter (HOSPITAL_COMMUNITY): Payer: Self-pay

## 2023-05-17 ENCOUNTER — Ambulatory Visit (HOSPITAL_COMMUNITY)
Admission: RE | Admit: 2023-05-17 | Discharge: 2023-05-17 | Disposition: A | Discharge status: Home / Self Care | Payer: Medicare Other | Source: Ambulatory Visit | Attending: Family Medicine | Admitting: Family Medicine

## 2023-05-17 ENCOUNTER — Other Ambulatory Visit (HOSPITAL_COMMUNITY): Payer: Self-pay | Admitting: Internal Medicine

## 2023-05-17 VITALS — BP 118/64 | HR 70 | Wt 152.8 lb

## 2023-05-17 DIAGNOSIS — I5084 End stage heart failure: Secondary | ICD-10-CM | POA: Insufficient documentation

## 2023-05-17 DIAGNOSIS — E1151 Type 2 diabetes mellitus with diabetic peripheral angiopathy without gangrene: Secondary | ICD-10-CM | POA: Insufficient documentation

## 2023-05-17 DIAGNOSIS — Z66 Do not resuscitate: Secondary | ICD-10-CM | POA: Insufficient documentation

## 2023-05-17 DIAGNOSIS — N183 Chronic kidney disease, stage 3 unspecified: Secondary | ICD-10-CM | POA: Diagnosis not present

## 2023-05-17 DIAGNOSIS — I4891 Unspecified atrial fibrillation: Secondary | ICD-10-CM

## 2023-05-17 DIAGNOSIS — I480 Paroxysmal atrial fibrillation: Secondary | ICD-10-CM | POA: Diagnosis not present

## 2023-05-17 DIAGNOSIS — E669 Obesity, unspecified: Secondary | ICD-10-CM | POA: Diagnosis not present

## 2023-05-17 DIAGNOSIS — I472 Ventricular tachycardia, unspecified: Secondary | ICD-10-CM | POA: Diagnosis not present

## 2023-05-17 DIAGNOSIS — I2510 Atherosclerotic heart disease of native coronary artery without angina pectoris: Secondary | ICD-10-CM | POA: Diagnosis not present

## 2023-05-17 DIAGNOSIS — I340 Nonrheumatic mitral (valve) insufficiency: Secondary | ICD-10-CM | POA: Diagnosis not present

## 2023-05-17 DIAGNOSIS — I5042 Chronic combined systolic (congestive) and diastolic (congestive) heart failure: Secondary | ICD-10-CM | POA: Insufficient documentation

## 2023-05-17 DIAGNOSIS — I724 Aneurysm of artery of lower extremity: Secondary | ICD-10-CM | POA: Diagnosis not present

## 2023-05-17 DIAGNOSIS — I130 Hypertensive heart and chronic kidney disease with heart failure and stage 1 through stage 4 chronic kidney disease, or unspecified chronic kidney disease: Secondary | ICD-10-CM | POA: Insufficient documentation

## 2023-05-17 DIAGNOSIS — I4729 Other ventricular tachycardia: Secondary | ICD-10-CM | POA: Insufficient documentation

## 2023-05-17 DIAGNOSIS — N1831 Chronic kidney disease, stage 3a: Secondary | ICD-10-CM

## 2023-05-17 DIAGNOSIS — E1122 Type 2 diabetes mellitus with diabetic chronic kidney disease: Secondary | ICD-10-CM | POA: Insufficient documentation

## 2023-05-17 DIAGNOSIS — Z79899 Other long term (current) drug therapy: Secondary | ICD-10-CM | POA: Diagnosis not present

## 2023-05-17 DIAGNOSIS — K219 Gastro-esophageal reflux disease without esophagitis: Secondary | ICD-10-CM | POA: Diagnosis not present

## 2023-05-17 DIAGNOSIS — Z7189 Other specified counseling: Secondary | ICD-10-CM

## 2023-05-17 DIAGNOSIS — G4730 Sleep apnea, unspecified: Secondary | ICD-10-CM | POA: Diagnosis not present

## 2023-05-17 DIAGNOSIS — I252 Old myocardial infarction: Secondary | ICD-10-CM | POA: Diagnosis not present

## 2023-05-17 DIAGNOSIS — Z7902 Long term (current) use of antithrombotics/antiplatelets: Secondary | ICD-10-CM | POA: Insufficient documentation

## 2023-05-17 DIAGNOSIS — E785 Hyperlipidemia, unspecified: Secondary | ICD-10-CM | POA: Diagnosis not present

## 2023-05-17 DIAGNOSIS — Z7901 Long term (current) use of anticoagulants: Secondary | ICD-10-CM | POA: Insufficient documentation

## 2023-05-17 DIAGNOSIS — I4510 Unspecified right bundle-branch block: Secondary | ICD-10-CM | POA: Insufficient documentation

## 2023-05-17 DIAGNOSIS — I428 Other cardiomyopathies: Secondary | ICD-10-CM | POA: Diagnosis not present

## 2023-05-17 DIAGNOSIS — I5022 Chronic systolic (congestive) heart failure: Secondary | ICD-10-CM

## 2023-05-17 LAB — BASIC METABOLIC PANEL
Anion gap: 13 (ref 5–15)
BUN: 33 mg/dL — ABNORMAL HIGH (ref 8–23)
CO2: 35 mmol/L — ABNORMAL HIGH (ref 22–32)
Calcium: 9.9 mg/dL (ref 8.9–10.3)
Chloride: 92 mmol/L — ABNORMAL LOW (ref 98–111)
Creatinine, Ser: 3.08 mg/dL — ABNORMAL HIGH (ref 0.44–1.00)
GFR, Estimated: 15 mL/min — ABNORMAL LOW (ref >60)
Glucose, Bld: 185 mg/dL — ABNORMAL HIGH (ref 70–99)
Potassium: 3.4 mmol/L — ABNORMAL LOW (ref 3.5–5.1)
Sodium: 140 mmol/L (ref 135–145)

## 2023-05-17 LAB — CBC
HCT: 37.8 % (ref 36.0–46.0)
Hemoglobin: 11.6 g/dL — ABNORMAL LOW (ref 12.0–15.0)
MCH: 27.3 pg (ref 26.0–34.0)
MCHC: 30.7 g/dL (ref 30.0–36.0)
MCV: 88.9 fL (ref 80.0–100.0)
Platelets: 212 10*3/uL (ref 150–400)
RBC: 4.25 MIL/uL (ref 3.87–5.11)
RDW: 17 % — ABNORMAL HIGH (ref 11.5–15.5)
WBC: 4.8 10*3/uL (ref 4.0–10.5)
nRBC: 0 % (ref 0.0–0.2)

## 2023-05-17 LAB — MAGNESIUM: Magnesium: 1.9 mg/dL (ref 1.7–2.4)

## 2023-05-17 MED ORDER — POTASSIUM CHLORIDE CRYS ER 20 MEQ PO TBCR: 20.0000 meq | EXTENDED_RELEASE_TABLET | Freq: Every day | ORAL | 3 refills | Status: DC

## 2023-05-17 MED ORDER — TORSEMIDE 20 MG PO TABS: 40.0000 mg | ORAL_TABLET | Freq: Every day | ORAL | 0 refills | Status: DC

## 2023-05-17 NOTE — Progress Notes (Signed)
[facility] Note   PCP: [name], MD Primary Cardiologist: Bryan Lemma, MD  [facility] Cardiologist: Dr. Gala Romney  HPI: 82 y.o. AAF w/ prior h/o dilated, nonischemic CM. Echo in 2018 showed reduced LVEF, 20-25% and mild MR. LHC showed mild-mod nonobstructive CAD. CM felt PVC mediated as holter showed 13% PVC burden. She was stated on amiodarone for suppression. Repeat holter showed improvement w/ only 1% PVC burden. Sleep study c/w mild sleep apnea, AHI 5.9/hr. F/u echo in 2019 EF improved to 35-40%. Echo 2020 EF 50-55%, mod MAC w/ mild MR. Echo 2021 EF 60-65%, mild-mod MR.   Initially evaluated in the summer 2018 and was noted to have frequent PVCs on EKG with a low resting heart rate. Holter showed 13% PVCs. Echo showed an EF of 20-30% and a Holter monitor which revealed significant amount of ventricular ectopy. She was also referred for right and left heart catheterization back in July that showed minimal CAD with EF 25-30%, LVEDP 26 mmHg with PCWP of only 16 mHg).   In 11/19 was having more PVCs on ECG so we placed ZioPatch. Results as below < 1% PVC. Echo 1/20 EF 50-55%   Had prolonged hospitalization from 01/10/23-02/19/23 for acute lateral wall MI secondary to distal left circumflex occlusion, complicated by severe ischemic MR and acute systolic heart failure with low output. Initially required placement of IABP which was later removed secondary to concern for limb ischemia. 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 Required several trips to the OR for groin washout, debridement and wound VAC placement. Wound cultures showed polymicrobial growth with moderate Morganella, Enterococcus and Pseudomonas, treated w/ IV abx. During hospital stay, she suffered a ventricular tachycardia arrest and ultimately underwent ICD placement. Course further c/b low output HF/ cardiorenal syndrome requiring inotropic support w/  milrinone.  She was able to wean off milrinone and once stabilized and medically cleared, was discharge to CIR from rehab from 3/10-3/19, then discharged home w/ home health support.   Readmitted 4/18-4/22/24 for intractable nausea and vomiting, felt secondary to acute gastroenteritis w/ subsequent AKI. Scr had bumped from prior b/l of 1.7>>3.0. Diuretics were held and she was managed supportively w/ IVFs. Symptoms improved and AKI resolved. She was given instruction to hold torsemide for 2 more days at d/c and to resume on 4/24.   Returned to ED on 04/05/23 with increased shortness of breath, nausea, vomiting and abdominal pain. Hospital course c/b sepsis and A/C HFrEF. Echo EF < 20% RV moderately reduced. Treated with IV antibiotics. Diuresed with IV lasix + metolazone. Creatinine at the time of discharge was 1.8. Discharged to home 04/16/23 on torsemide.   She was re-admitted 04/20/23 with AKI, SCr up to 3.2. Cards consulted and she was started on milrinone 0.25 for presumed low output HF. Nephrology consulted but no immediate indication for HD. She was diuresed with IV lasix, and with augmentation of inotrope, SCr trending down. She had PMVT on 04/30/23 with LOC x 2 and appropriately treated with ICD shocks. Milrinone was reduced to 0.125 and amio gtt started. She ultimately failed her milrinone wean. Discussed de-activation of ICD while inpatient, but she declined. Amio gtt weaned to oral and she was discharged home on palliative milrinone 0.125, with understanding that if she continues to have frequent ICD firings, recommend shift to comfort care. Discharge weight 142 lbs.  Today she returns for post hospital HF follow up with her sister. Overall feeling poorly. Main complaint is  nausea, appetite is poor. Has more bad days then good, "I am just tired." Not urinating as briskly. She has SOB walking short distances with her rolling walker. She has swelling in legs. Has new 2 pillow orthopnea. Denies  palpitations, CP, dizziness, or PND. Appetite poor. No fever or chills. Weight at home 140-145 pounds. Taking all medications. Understandably frustrated about current health conditions.   Cardiac Studies - Echo (4/24): EF < 20%, grade III DD, RV moderately reduced, mod to severe MR  - R/LHC (1/24): distal occlusion of Cx, s/p DES PIC to Cx, IABP inserted with severe ischemic MR.  RA mean 15, PA 74/35 (48), CO/CI (Fick) 3.7/2.1, PVR 5.72  - Ltd Echo (1/24): EF 20-25%, LV with RWMAs, RV mildly reduced, severe ischemic MR   - Echo (2/23): EF 50-55%, grade II DD, RV ok, mild to moderate TR  - Echo (2021): EF 60-65%, mild-mod MR.   - Echo (2020): EF 50-55%, mod MAC w/ mild MR.  - Echo (2019): EF improved to 35-40%.    - cMRI (2/19): LVEF 27% w/ frequent PVCs, RVEF mildly dilated, no LGE  - Echo (11/18): EF 30-35%  - R/LHC (7/18): showed mild-mod nonobstructive CAD, LVEF 25-35%. PA 45/14 (27), PCWP 23/21 (18), CO/CI (Fick) 4.47/2.4   - Echo (2018): EF, 20-25% and mild MR.   Past Medical History:  Diagnosis Date   AICD (automatic cardioverter/defibrillator) present    Calcific tendonitis 2008   Treatment of left leg   Cardiomyopathy (HCC) 06/2017   a) Echo: EF 25-30%. GR 1-2 DD w/ elevated LVEDP. Mild valvular Dz; b) Cardiac MRI 2/'19: frequent PVCs (diffiuclt to interpret) - EF ~27% w/ diffuse HK.  No evidence of infarct, infiltrative Dz or myocarditis. -- ? if related to PVCs. c) f/u Echo 6/'19: EF 35-40%. Gr 1 DD. Diffuse HK.-> d) Jan 2020 EF 50 to 55%.  Mod MAC, mod LAE.  Ao Sclerosis; e) Echo 1/'21 - EF 60-65%, Gr II DD. Mild Mod MR.    Chronic combined systolic and diastolic CHF, NYHA class 2 and ACA/AHA stage C    Now essentially resolved, back to simply diastolic CHF   Coronary artery disease, non-occlusive    mild-moderate CAD 06/30/17 cath   CTS (carpal tunnel syndrome)    Diabetes mellitus    Type II   Dysrhythmia    patient said that she cant remember what it is    Frequent unifocal PVCs 01/2018   Event Monitor: NSR, W/ frequent multifocal PVCs (9%-down from 30% prior to amiodarone) and PACs.  Nighttime bradycardia suggestive of OSA.';  Zio patch September 2023: PVC burden now 6.1%.   Hiatal hernia    with reflux   Hyperlipidemia    Statin intolerant   Hypertension    Ichthyosis congenita    Intermittent claudication of both lower extremities due to atherosclerosis (HCC) 08/22/2021   LEA Dopplers 09/09/2021:  Right: Total occlusion noted in the superficial femoral artery. Atherosclerosis noted throughout extremity, see note above. Three vessel runoff.  Left: Total occlusion noted in the superficial femoral artery and/or popliteal artery. Total occlusion noted in the distal anterior tibial artery. Athereosclerosis noted throughout extremity.    NSVD (normal spontaneous vaginal delivery) 1971 & 1972   Obesity    Plantar fasciitis    Left   Presence of permanent cardiac pacemaker    Pulmonary nodule    Imaged multiple times and benign appearing   Sleep apnea    Wears glasses    Current Outpatient Medications  Medication Sig Dispense Refill   acetaminophen (TYLENOL) 325 MG tablet Take 1-2 tablets (325-650 mg total) by mouth every 6 (six) hours as needed. (Patient taking differently: Take 325-650 mg by mouth in the morning and at bedtime.)     amiodarone (PACERONE) 200 MG tablet Take 1 tablet (200 mg total) by mouth 2 (two) times daily. ONLY for 6 Weeks; Then: Take 1 tablet ( 200 mg total) by mouth, daily. 90 tablet 3   apixaban (ELIQUIS) 2.5 MG TABS tablet Take 1 tablet (2.5 mg total) by mouth 2 (two) times daily. 60 tablet 3   clopidogrel (PLAVIX) 75 MG tablet Take 1 tablet (75 mg total) by mouth daily. 30 tablet 3   glucose blood (ACCU-CHEK AVIVA PLUS) test strip USE TO TEST BLOOD SUGAR ONCE DAILY 100 strip 2   milrinone (PRIMACOR) 20 MG/100 ML SOLN infusion Inject 0.0063 mg/min into the vein continuous.     Multiple Vitamin (MULTIVITAMIN WITH  MINERALS) TABS tablet Take 1 tablet by mouth daily.     ondansetron (ZOFRAN-ODT) 4 MG disintegrating tablet Take 1 tablet (4 mg total) by mouth every 8 (eight) hours as needed for nausea or vomiting. 30 tablet 2   rosuvastatin (CRESTOR) 10 MG tablet Take 1 tablet (10 mg total) by mouth daily. 30 tablet 3   torsemide (DEMADEX) 20 MG tablet Take 2 tablets (40 mg total) by mouth 2 (two) times daily. (Patient taking differently: Take 20 mg by mouth daily.) 120 tablet 0   traMADol (ULTRAM) 50 MG tablet Take 1 tablet (50 mg total) by mouth 2 (two) times daily as needed for severe pain. 14 tablet 0   triamcinolone cream (KENALOG) 0.1 % Apply 1 Application topically 2 (two) times daily as needed (for itching). 30 g 0   No current facility-administered medications for this encounter.   Allergies  Allergen Reactions   Chlorhexidine Gluconate Rash    Pt refuses CHG due to inflammation and rash   Ezetimibe-Simvastatin Other (See Comments)    REACTION: Muscle aches (side effect)   Lipitor [Atorvastatin] Other (See Comments)    Leg weakness    Claritin [Loratadine]     Possible cause of nightmares.     Spironolactone     Caution re: elevated potassium/creatinine   Tamiflu [Oseltamivir Phosphate] Other (See Comments)    nightmares   Pravastatin Sodium Other (See Comments)    REACTION: Muscle aches (side effect)   Sulfonamide Derivatives Nausea And Vomiting    Social History   Socioeconomic History   Marital status: Married    Spouse name: Not on file   Number of children: 2   Years of education: Not on file   Highest education level: Bachelor's degree (e.g., BA, AB, BS)  Occupational History    Employer: RETIRED  Tobacco Use   Smoking status: Former    Years: 28    Types: Cigarettes    Quit date: 12/12/1993    Years since quitting: 29.4    Passive exposure: Never   Smokeless tobacco: Never  Vaping Use   Vaping Use: Never used  Substance and Sexual Activity   Alcohol use: No     Alcohol/week: 0.0 standard drinks of alcohol   Drug use: No   Sexual activity: Never  Other Topics Concern   Not on file  Social History Narrative   Two kids, two grandchildren, two great grandchildren.    Married 1969   Retired.    Social Determinants of Health   Financial Resource Strain: Low Risk  (  03/27/2023)   Overall Financial Resource Strain (CARDIA)    Difficulty of Paying Living Expenses: Not very hard  Food Insecurity: No Food Insecurity (04/17/2023)   Hunger Vital Sign    Worried About Running Out of Food in the Last Year: Never true    Ran Out of Food in the Last Year: Never true  Transportation Needs: No Transportation Needs (04/17/2023)   PRAPARE - Administrator, Civil Service (Medical): No    Lack of Transportation (Non-Medical): No  Physical Activity: Sufficiently Active (03/27/2023)   Exercise Vital Sign    Days of Exercise per Week: 4 days    Minutes of Exercise per Session: 40 min  Stress: No Stress Concern Present (03/27/2023)   [employer] of Occupational Health - Occupational Stress Questionnaire    Feeling of Stress : Only a little  Social Connections: Unknown (03/27/2023)   Social Connection and Isolation Panel [NHANES]    Frequency of Communication with Friends and Family: More than three times a week    Frequency of Social Gatherings with Friends and Family: Twice a week    Attends Religious Services: More than 4 times per year    Active Member of [employer] or Organizations: Not on file    Attends Banker Meetings: Not on file    Marital Status: Married  Intimate Partner Violence: Not At Risk (04/06/2023)   Humiliation, Afraid, Rape, and Kick questionnaire    Fear of Current or Ex-Partner: No    Emotionally Abused: No    Physically Abused: No    Sexually Abused: No   Family History  Problem Relation Age of Onset   Heart disease Mother        s/p pacemaker   Diabetes Mother    Heart disease Father    Diabetes Father     Heart disease Brother        MI   Cancer Brother        Lung   Hypertension Other    Colon cancer Neg Hx    Breast cancer Neg Hx    BP 118/64   Pulse 70   Wt 69.3 kg (152 lb 12.8 oz)   SpO2 96%   BMI 27.95 kg/m   Wt Readings from Last 3 Encounters:  05/17/23 69.3 kg (152 lb 12.8 oz)  05/11/23 64.2 kg (141 lb 9.6 oz)  05/10/23 66.7 kg (147 lb)   PHYSICAL EXAM: General:  Walked into clinic with RW, elderly, frail, mild conversational dyspnea HEENT: + HOH Neck: Supple. JVP to jaw. Carotids 2+ bilat; no bruits. No lymphadenopathy or thryomegaly appreciated. Cor: PMI nondisplaced. Regular rate & rhythm. No rubs, gallops, 2/6 MR Lungs: Clear, diminished in bases Abdomen: Soft, nontender, nondistended. No hepatosplenomegaly. No bruits or masses. Good bowel sounds. Extremities: No cyanosis, clubbing, rash, 2+ BLE edema Neuro: Alert & oriented x 3, cranial nerves grossly intact. Moves all 4 extremities w/o difficulty. Affect pleasant.  ECG (personally reviewed): A paced 70 bpm, QRS 641 msec  REDs: 41%  BoSCI ICD interrogation (personally reviewed): HL score 2, no VT, average HR 71 bpm, 0.3 hr/day activity.  ASSESSMENT & PLAN: 1. Acute on Chronic systolic CHF - Multiple admissions for HF this year. - Echo 4/24 with EF < 20%, moderate RV dysfunction, moderate-severe infarct-related MR, IVC dilated.  - Suspect recurrent low output HF.  She is end-stage HF - NYHA IIIb, volume up  today, weight up 10 lbs, ReDs 41%. GDMT limited by CKD w/ worsening SCr.  -  Increase torsemide to 40 mg daily, add 20 KCL daily. - Continue milrinone 0.125. This is palliative home milrinone.  - She is not a candidate for transplant (age) or LVAD (age, elevated creatinine, frailty).  - Labs today. Follow labs with [facility] RN  2. CKD stage 3:  - Suspect cardiorenal syndrome.   - Baseline SCr now ~2 - Continue milrinone support  - Most recent SCr 3 - Diurese as above.  3. Mitral regurgitation:  -  Infarct-related MR.   - Moderate on 2/24 TEE, mod-severe on 4/24 echo.  - Manage medically.  - Diurese as above.  4. CAD:  - STEMI 1/24 with PLOM treated with DES.  - No chest pain - Continue Plavix, no ASA given apixaban use.  - Continue statin.   5. Atrial fibrillation:  - Paroxysmal.   - Currently A-paced.  - Continue amiodarone (this was initiated inpatient for VT) - Continue Eliquis 2.5 mg bid (age > 80 and creatinine > 1.5.)  6. VT:  - Has [employer] ICD and on amiodarone.  - 04/30/23 had polymorphic VT w/ LOC x 2 treated w/ ICD shocks. K was 3.1. Milrinone reduced to 0.125. Amio gtt started - With end-stage HF and now on home milrinone, risk of recurrence is high.  - Continue amiodarone taper, per EP - She declined her ICD shock therapies disabled inpatient.  7. GOC - Long discussion today about EOL and how cardio-renal syndrome and end-stage HF present challenges with managing symptoms. - She endorses more bad days than good, she does not feel better on milrinone. - Discussed palliaitve vs hospice. She seems to be leaning more towards comfort care but not quite there yet. - Will bring back in 2 weeks after we have attempted more diuresis, to discuss further. - Needs [facility] on board, will re-refer today. - She was DNR inpatient and wishes to continue this  Follow up with APP in 2 weeks. If volume not improved, favor shift to comfort care.   Prince Rome, FNP-BC 05/17/23  Greater than 50% of the (total minutes 45) visit spent in counseling/coordination of care regarding ( end stage HF, renal disease, and hospice vs palliative care).

## 2023-05-17 NOTE — Patient Instructions (Signed)
Medication Changes:  INCREASE torsemide to 40 mg (2 tablets) DAILY  START taking potassium 20 mEq DAILY.    *If you need a refill on your cardiac medications before your next appointment, please call your pharmacy*  Lab Work:  Labs done today, your results will be available in MyChart, we will contact you for abnormal readings.  Your home health RN will draw up a bmet in a week.    Referrals:  You have been referred to palliative care. They will call you to schedule an appointment.    Follow-Up in:   Your physician recommends that you schedule a follow-up appointment in: 2 weeks with APP.    Do the following things EVERYDAY: Weigh yourself in the morning before breakfast. Write it down and keep it in a log. Take your medicines as prescribed Eat low salt foods--Limit salt (sodium) to 2000 mg per day.  Stay as active as you can everyday Limit all fluids for the day to less than 2 liters    Need to Contact us:  If you have any questions or concerns before your next appointment please send [place] a message through [place] or call our office at [phone].    TO LEAVE A MESSAGE FOR THE NURSE SELECT OPTION 2, PLEASE LEAVE A MESSAGE INCLUDING: YOUR NAME DATE OF BIRTH CALL BACK NUMBER REASON FOR CALL**this is important as we prioritize the call backs  YOU WILL RECEIVE A CALL BACK THE SAME DAY AS LONG AS YOU CALL BEFORE 4:00 PM   At the [facility], you and your health needs are our priority. As part of our continuing mission to provide you with exceptional heart care, we have created designated Provider Care Teams. These Care Teams include your primary Cardiologist (physician) and Advanced Practice Providers (APPs- Physician Assistants and Nurse Practitioners) who all work together to provide you with the care you need, when you need it.   You may see any of the following providers on your designated Care Team at your next follow up: Dr Arvilla Meres Dr  Marca Ancona Dr. Marcos Eke, NP Robbie Lis, [place] [address] [facility] [place], [place] Brynda Peon, NP Karle Plumber, PharmD   Please be sure to bring in all your medications bottles to every appointment.    Thank you for choosing [facility]

## 2023-05-17 NOTE — Progress Notes (Signed)
ReDS Vest / Clip - 05/17/23 1100       ReDS Vest / Clip   Station Marker A    Ruler Value 23    ReDS Value Range High volume overload    ReDS Actual Value 41

## 2023-05-18 NOTE — Telephone Encounter (Signed)
Called and advised patient of Dr. Armanda Heritage message

## 2023-05-18 NOTE — Telephone Encounter (Signed)
Called and spoke to patient, advised of Dr. Armanda Heritage message. Patient will continue as is for now. Scheduled lab appointment for 05/19/23.

## 2023-05-18 NOTE — Telephone Encounter (Signed)
Please call pt.  I just saw her labs and plans per cardiology.  She had labs done yesterday, so she doesn't need to repeat them tomorrow.  I would follow the plan laid out by cardiology.  Thanks.

## 2023-05-19 ENCOUNTER — Other Ambulatory Visit: Payer: Medicare Other

## 2023-05-19 DIAGNOSIS — Z515 Encounter for palliative care: Secondary | ICD-10-CM

## 2023-05-22 DIAGNOSIS — I2510 Atherosclerotic heart disease of native coronary artery without angina pectoris: Secondary | ICD-10-CM | POA: Diagnosis not present

## 2023-05-22 DIAGNOSIS — I5022 Chronic systolic (congestive) heart failure: Secondary | ICD-10-CM | POA: Diagnosis not present

## 2023-05-22 DIAGNOSIS — I5043 Acute on chronic combined systolic (congestive) and diastolic (congestive) heart failure: Secondary | ICD-10-CM | POA: Diagnosis not present

## 2023-05-24 ENCOUNTER — Other Ambulatory Visit (HOSPITAL_COMMUNITY): Payer: Self-pay | Admitting: Family Medicine

## 2023-05-24 ENCOUNTER — Telehealth: Payer: Self-pay

## 2023-05-24 NOTE — Telephone Encounter (Signed)
(  2:10 pm) PC SW returned call to patient as she was requesting to change her appointment time to an earlier appointment. Patient could not tell SW whey she wanted the change, but stated she needed her appointment to be sooner. SW rescheduled her appointment from to 05/25/23 @ 12:30 pm and will cancel the 06/28/23 appointment with the nurse.

## 2023-05-25 ENCOUNTER — Other Ambulatory Visit: Payer: Medicare Other

## 2023-05-25 DIAGNOSIS — Z515 Encounter for palliative care: Secondary | ICD-10-CM

## 2023-05-25 DIAGNOSIS — H905 Unspecified sensorineural hearing loss: Secondary | ICD-10-CM | POA: Diagnosis not present

## 2023-05-25 NOTE — Progress Notes (Signed)
COMMUNITY PALLIATIVE CARE SW NOTE  PATIENT NAME: Natasha Woodard DOB: [birth year] MRN: [id number]  PRIMARY CARE PROVIDER: [name], MD  RESPONSIBLE PARTY:  Acct ID - Guarantor Home Phone Work Phone Relationship Acct Type  [id number] [name]* [phone]  Self P/F     [address], [place], [place] [address]   Initial Palliative Care Encounter/Clinical Social Work  [employer] SW completed an initial telephonic encounter with patient. Patient was provided an introduction and education regarding palliative care services. She verbalized understanding and provided an initial consent to services, along with a brief status update.  Patient report that she is in constant pain due to arthritis and is taking Tramadol. She feels that the Tramadol offers little relief. She states that she does not have an appetite. She report eating 1 small meal with a snack and Ensure. She ambulates with a rollator. Her husband assist her up and down the stairs. She report that she has incontinent episodes as she has a bad case of diarrhea.   She expressed no immediate needs. SW scheduled a follow-up appointment scheduled for 06/28/23. Social History   Tobacco Use   Smoking status: Former    Years: 28    Types: Cigarettes    Quit date: 12/12/1993    Years since quitting: 29.4    Passive exposure: Never   Smokeless tobacco: Never  Substance Use Topics   Alcohol use: No    Alcohol/week: 0.0 standard drinks of alcohol    CODE STATUS: To  be assessed ADVANCED DIRECTIVES: No MOST FORM COMPLETE: No HOSPICE EDUCATION PROVIDED: No  Duration of encounter and documentation: 30 minutes  [employer], LCSW

## 2023-05-26 ENCOUNTER — Telehealth (HOSPITAL_COMMUNITY): Payer: Self-pay | Admitting: Cardiology

## 2023-05-26 ENCOUNTER — Ambulatory Visit: Payer: Medicare Other | Admitting: Family Medicine

## 2023-05-26 NOTE — Telephone Encounter (Signed)
Vernona Rieger, RN with [facility] called to report Pts milrinone has been off for ~1 1/2. [employer] will deliver meds by 8pm and HHRN [place] set up to restart tonight  Above reviewed with [name] Noted Ok as long as meds restarted within 24 hours   Vernona Rieger aware

## 2023-05-28 NOTE — Progress Notes (Signed)
1030 Palliative Care Encounter Note   PATIENT NAME: Natasha Woodard DOB: [birth year] MRN: [id number]  PRIMARY CARE PROVIDER: [name], MD  RESPONSIBLE PARTY:  Acct ID - Guarantor Home Phone Work Phone Relationship Acct Type  [id number] [name]* [phone]  Self P/F     [address], [place], [place] [address]   I connected with pt on 05/25/23 by telephone and verified that I am speaking with the correct person using two identifiers.   I discussed the limitations of evaluation and management by telemedicine. The patient expressed understanding and agreed to proceed.    HISTORY OF PRESENT ILLNESS:  61 yof w/ significant complex medical history including chronic HFrEF (EF less than 20%), moderate to severe MR, CAD status post PCI 12/2022, hypertension, hyperlipidemia, paroxysmal A-fib on Eliquis, history of VT arrest, status post ICD, CKD stage IIIb, pseudoaneurysm of right groin.   Socially: lives with husband. Has children and grandchildren.   Cognitive: Alert and oriented x3. Answers questions appropriately. Participating in conversation.    Appetite: only eating bites every day. Started a couple of weeks ago.  Very nauseous all the time and doesn't really want to eat. Currently weighs 145. Down from 167 in January.   Mobility: Uses rollator to walk. Denies any falls.   Respiratory: Pt reports that she wears oxygen at night. And reports that gets short of breath  when walking.   Sleeping Pattern: Reports sleeping good. States, "I sleep about 5 hours a night and nap during the day."  Pain: Reports pain all over. "Very achy most of the time." Takes tramadol for pain twice a day.  Gi/GU: Pt reports that she has diarrhea every day. Has a Dr. Para March appt tomorrow to discuss.  Palliative Care/ Hospice: RN explained role and purpose of palliative care including visit frequency. Also discussed benefits of hospice care as well as the differences between the two with  patient.   Goals of Care: get stronger. Spend more time with family.    CODE STATUS: DNR ADVANCED DIRECTIVES: yes MOST FORM: No  Next appt scheduled: Dee and Monica to call and schedule follow up appt.     PHYSICAL EXAM:   VITALS:There were no vitals filed for this visit.         Barbette Merino, RN

## 2023-05-29 ENCOUNTER — Telehealth: Payer: Self-pay | Admitting: Family Medicine

## 2023-05-29 ENCOUNTER — Inpatient Hospital Stay (HOSPITAL_COMMUNITY)
Admission: EM | Admit: 2023-05-29 | Discharge: 2023-06-12 | DRG: 951 | Disposition: E | Payer: Medicare Other | Attending: Student | Admitting: Student

## 2023-05-29 ENCOUNTER — Other Ambulatory Visit: Payer: Self-pay

## 2023-05-29 ENCOUNTER — Ambulatory Visit (INDEPENDENT_AMBULATORY_CARE_PROVIDER_SITE_OTHER): Payer: Medicare Other | Admitting: Family Medicine

## 2023-05-29 ENCOUNTER — Telehealth: Payer: Self-pay

## 2023-05-29 ENCOUNTER — Emergency Department (HOSPITAL_COMMUNITY): Payer: Medicare Other

## 2023-05-29 ENCOUNTER — Encounter: Payer: Self-pay | Admitting: Family Medicine

## 2023-05-29 VITALS — BP 102/60 | HR 60 | Temp 98.0°F | Ht 62.0 in | Wt 152.0 lb

## 2023-05-29 DIAGNOSIS — Z8674 Personal history of sudden cardiac arrest: Secondary | ICD-10-CM

## 2023-05-29 DIAGNOSIS — I480 Paroxysmal atrial fibrillation: Secondary | ICD-10-CM | POA: Diagnosis present

## 2023-05-29 DIAGNOSIS — R0682 Tachypnea, not elsewhere classified: Secondary | ICD-10-CM | POA: Diagnosis not present

## 2023-05-29 DIAGNOSIS — I5082 Biventricular heart failure: Secondary | ICD-10-CM | POA: Diagnosis not present

## 2023-05-29 DIAGNOSIS — I2510 Atherosclerotic heart disease of native coronary artery without angina pectoris: Secondary | ICD-10-CM | POA: Diagnosis not present

## 2023-05-29 DIAGNOSIS — I5023 Acute on chronic systolic (congestive) heart failure: Secondary | ICD-10-CM

## 2023-05-29 DIAGNOSIS — I071 Rheumatic tricuspid insufficiency: Secondary | ICD-10-CM | POA: Diagnosis present

## 2023-05-29 DIAGNOSIS — Z90710 Acquired absence of both cervix and uterus: Secondary | ICD-10-CM

## 2023-05-29 DIAGNOSIS — Z743 Need for continuous supervision: Secondary | ICD-10-CM | POA: Diagnosis not present

## 2023-05-29 DIAGNOSIS — E11649 Type 2 diabetes mellitus with hypoglycemia without coma: Secondary | ICD-10-CM | POA: Diagnosis present

## 2023-05-29 DIAGNOSIS — Z8249 Family history of ischemic heart disease and other diseases of the circulatory system: Secondary | ICD-10-CM

## 2023-05-29 DIAGNOSIS — Z66 Do not resuscitate: Secondary | ICD-10-CM | POA: Diagnosis present

## 2023-05-29 DIAGNOSIS — I429 Cardiomyopathy, unspecified: Secondary | ICD-10-CM | POA: Diagnosis present

## 2023-05-29 DIAGNOSIS — I5043 Acute on chronic combined systolic (congestive) and diastolic (congestive) heart failure: Secondary | ICD-10-CM | POA: Diagnosis not present

## 2023-05-29 DIAGNOSIS — Z8679 Personal history of other diseases of the circulatory system: Secondary | ICD-10-CM

## 2023-05-29 DIAGNOSIS — E872 Acidosis, unspecified: Secondary | ICD-10-CM | POA: Diagnosis not present

## 2023-05-29 DIAGNOSIS — R6889 Other general symptoms and signs: Secondary | ICD-10-CM | POA: Diagnosis not present

## 2023-05-29 DIAGNOSIS — E876 Hypokalemia: Secondary | ICD-10-CM | POA: Diagnosis not present

## 2023-05-29 DIAGNOSIS — I493 Ventricular premature depolarization: Secondary | ICD-10-CM

## 2023-05-29 DIAGNOSIS — J849 Interstitial pulmonary disease, unspecified: Secondary | ICD-10-CM | POA: Diagnosis not present

## 2023-05-29 DIAGNOSIS — Z7902 Long term (current) use of antithrombotics/antiplatelets: Secondary | ICD-10-CM

## 2023-05-29 DIAGNOSIS — Z515 Encounter for palliative care: Principal | ICD-10-CM

## 2023-05-29 DIAGNOSIS — E1165 Type 2 diabetes mellitus with hyperglycemia: Secondary | ICD-10-CM | POA: Diagnosis present

## 2023-05-29 DIAGNOSIS — Z7901 Long term (current) use of anticoagulants: Secondary | ICD-10-CM

## 2023-05-29 DIAGNOSIS — N184 Chronic kidney disease, stage 4 (severe): Secondary | ICD-10-CM | POA: Diagnosis not present

## 2023-05-29 DIAGNOSIS — D631 Anemia in chronic kidney disease: Secondary | ICD-10-CM | POA: Diagnosis not present

## 2023-05-29 DIAGNOSIS — E1122 Type 2 diabetes mellitus with diabetic chronic kidney disease: Secondary | ICD-10-CM | POA: Diagnosis present

## 2023-05-29 DIAGNOSIS — I130 Hypertensive heart and chronic kidney disease with heart failure and stage 1 through stage 4 chronic kidney disease, or unspecified chronic kidney disease: Secondary | ICD-10-CM | POA: Diagnosis present

## 2023-05-29 DIAGNOSIS — Z9079 Acquired absence of other genital organ(s): Secondary | ICD-10-CM

## 2023-05-29 DIAGNOSIS — I499 Cardiac arrhythmia, unspecified: Secondary | ICD-10-CM | POA: Diagnosis not present

## 2023-05-29 DIAGNOSIS — R0902 Hypoxemia: Secondary | ICD-10-CM | POA: Diagnosis present

## 2023-05-29 DIAGNOSIS — R0603 Acute respiratory distress: Secondary | ICD-10-CM | POA: Diagnosis present

## 2023-05-29 DIAGNOSIS — E1151 Type 2 diabetes mellitus with diabetic peripheral angiopathy without gangrene: Secondary | ICD-10-CM | POA: Diagnosis present

## 2023-05-29 DIAGNOSIS — G4731 Primary central sleep apnea: Secondary | ICD-10-CM | POA: Diagnosis not present

## 2023-05-29 DIAGNOSIS — I420 Dilated cardiomyopathy: Secondary | ICD-10-CM

## 2023-05-29 DIAGNOSIS — N179 Acute kidney failure, unspecified: Secondary | ICD-10-CM | POA: Diagnosis present

## 2023-05-29 DIAGNOSIS — Z833 Family history of diabetes mellitus: Secondary | ICD-10-CM

## 2023-05-29 DIAGNOSIS — E162 Hypoglycemia, unspecified: Secondary | ICD-10-CM

## 2023-05-29 DIAGNOSIS — Z95810 Presence of automatic (implantable) cardiac defibrillator: Secondary | ICD-10-CM | POA: Diagnosis not present

## 2023-05-29 DIAGNOSIS — R000 Tachycardia, unspecified: Secondary | ICD-10-CM | POA: Diagnosis present

## 2023-05-29 DIAGNOSIS — Z90722 Acquired absence of ovaries, bilateral: Secondary | ICD-10-CM

## 2023-05-29 DIAGNOSIS — E785 Hyperlipidemia, unspecified: Secondary | ICD-10-CM | POA: Diagnosis not present

## 2023-05-29 DIAGNOSIS — I10 Essential (primary) hypertension: Secondary | ICD-10-CM

## 2023-05-29 DIAGNOSIS — I482 Chronic atrial fibrillation, unspecified: Secondary | ICD-10-CM | POA: Diagnosis not present

## 2023-05-29 DIAGNOSIS — E161 Other hypoglycemia: Secondary | ICD-10-CM | POA: Diagnosis not present

## 2023-05-29 DIAGNOSIS — R404 Transient alteration of awareness: Secondary | ICD-10-CM | POA: Diagnosis not present

## 2023-05-29 DIAGNOSIS — Z79899 Other long term (current) drug therapy: Secondary | ICD-10-CM

## 2023-05-29 DIAGNOSIS — Z882 Allergy status to sulfonamides status: Secondary | ICD-10-CM

## 2023-05-29 DIAGNOSIS — Z87891 Personal history of nicotine dependence: Secondary | ICD-10-CM | POA: Diagnosis not present

## 2023-05-29 DIAGNOSIS — Z955 Presence of coronary angioplasty implant and graft: Secondary | ICD-10-CM

## 2023-05-29 DIAGNOSIS — I509 Heart failure, unspecified: Secondary | ICD-10-CM

## 2023-05-29 DIAGNOSIS — G4739 Other sleep apnea: Secondary | ICD-10-CM | POA: Diagnosis not present

## 2023-05-29 DIAGNOSIS — K219 Gastro-esophageal reflux disease without esophagitis: Secondary | ICD-10-CM | POA: Diagnosis present

## 2023-05-29 DIAGNOSIS — Z888 Allergy status to other drugs, medicaments and biological substances status: Secondary | ICD-10-CM

## 2023-05-29 LAB — BASIC METABOLIC PANEL
Anion gap: 28 — ABNORMAL HIGH (ref 5–15)
BUN: 79 mg/dL — ABNORMAL HIGH (ref 8–23)
CO2: 17 mmol/L — ABNORMAL LOW (ref 22–32)
Calcium: 8.9 mg/dL (ref 8.9–10.3)
Chloride: 91 mmol/L — ABNORMAL LOW (ref 98–111)
Creatinine, Ser: 6.95 mg/dL — ABNORMAL HIGH (ref 0.44–1.00)
GFR, Estimated: 5 mL/min — ABNORMAL LOW (ref >60)
Glucose, Bld: 188 mg/dL — ABNORMAL HIGH (ref 70–99)
Potassium: 5.1 mmol/L (ref 3.5–5.1)
Sodium: 136 mmol/L (ref 135–145)

## 2023-05-29 LAB — CBC WITH DIFFERENTIAL/PLATELET
Abs Immature Granulocytes: 0.07 10*3/uL (ref 0.00–0.07)
Basophils Absolute: 0 10*3/uL (ref 0.0–0.1)
Basophils Relative: 0 %
Eosinophils Absolute: 0 10*3/uL (ref 0.0–0.5)
Eosinophils Relative: 0 %
HCT: 35.9 % — ABNORMAL LOW (ref 36.0–46.0)
Hemoglobin: 11.5 g/dL — ABNORMAL LOW (ref 12.0–15.0)
Immature Granulocytes: 1 %
Lymphocytes Relative: 3 %
Lymphs Abs: 0.2 10*3/uL — ABNORMAL LOW (ref 0.7–4.0)
MCH: 26.5 pg (ref 26.0–34.0)
MCHC: 32 g/dL (ref 30.0–36.0)
MCV: 82.7 fL (ref 80.0–100.0)
Monocytes Absolute: 0.6 10*3/uL (ref 0.1–1.0)
Monocytes Relative: 9 %
Neutro Abs: 5.6 10*3/uL (ref 1.7–7.7)
Neutrophils Relative %: 87 %
Platelets: 205 10*3/uL (ref 150–400)
RBC: 4.34 MIL/uL (ref 3.87–5.11)
RDW: 17.1 % — ABNORMAL HIGH (ref 11.5–15.5)
WBC: 6.5 10*3/uL (ref 4.0–10.5)
nRBC: 1.5 % — ABNORMAL HIGH (ref 0.0–0.2)

## 2023-05-29 LAB — CBG MONITORING, ED: Glucose-Capillary: 240 mg/dL — ABNORMAL HIGH (ref 70–99)

## 2023-05-29 MED ORDER — AMIODARONE HCL 200 MG PO TABS: 200.0000 mg | ORAL_TABLET | Freq: Every day | ORAL | Status: DC

## 2023-05-29 MED ORDER — ACETAMINOPHEN 650 MG RE SUPP: 650.0000 mg | Freq: Four times a day (QID) | RECTAL | Status: DC | PRN

## 2023-05-29 MED ORDER — ACETAMINOPHEN 325 MG PO TABS: 650.0000 mg | ORAL_TABLET | Freq: Four times a day (QID) | ORAL | Status: DC | PRN

## 2023-05-29 MED ORDER — GLUCAGON HCL RDNA (DIAGNOSTIC) 1 MG IJ SOLR
1.0000 mg | Freq: Once | INTRAMUSCULAR | Status: AC | PRN
  Administered 2023-05-29: 1 mg via INTRAVENOUS

## 2023-05-29 MED ORDER — LOPERAMIDE HCL 2 MG PO TABS: 2.0000 mg | ORAL_TABLET | Freq: Four times a day (QID) | ORAL | 1 refills | Status: DC | PRN

## 2023-05-29 MED ORDER — DEXTROSE 50 % IV SOLN
1.0000 | Freq: Once | INTRAVENOUS | Status: AC
  Administered 2023-05-29: 50 mL via INTRAVENOUS

## 2023-05-29 MED ORDER — SCOPOLAMINE 1 MG/3DAYS TD PT72: 1.0000 | MEDICATED_PATCH | TRANSDERMAL | 1 refills | Status: DC

## 2023-05-29 NOTE — ED Notes (Signed)
Family is at bedside. °

## 2023-05-29 NOTE — Patient Instructions (Addendum)
Try using a scopolamine patch for nausea and vomiting.  If it makes you drowsy, then take it off and let me know.    Try using imodium 2mg  as needed for loose stools or diarrhea.   Let me know if that isn't helping.    We will call hospice about having them see you at home.   Take care.  Glad to see you.

## 2023-05-29 NOTE — Telephone Encounter (Signed)
Device alert Right ventricular pacing of > 50%. Pacing was 96% between May 28, 2023 03:40 and May 29, 2023 03:40. Currently 23%, increase in VP per histogram since 5/30 - route to triage LA, CVRS  Reviewed with industry, [employer].   Changes made on 5/30 at OV with Dr. Ladona Ridgel. No concerns at present with increased VP, will continue to monitor.

## 2023-05-29 NOTE — ED Provider Notes (Signed)
[place] [facility]ERGENCY DEPARTMENT AT [facility] Provider Note   HPI: Natasha Woodard is an 82 year old female with a past medical history as below presenting today with hypoglycemia and altered mental status.  She arrives with EMS who report a blood sugar of 52 in transit.  They report they have been unable to obtain IV access and have given glucagon.  She arrives on a nonrebreather at 15 L with altered mental status.  She arrives with DNR paperwork.  She has a pacemaker in place.  They report the patient has been hemodynamically stable with her blood pressure and heart rate however has been minimally responsive.  They report her family are at the hospital.  The patient is unable to provide additional history.  Past Medical History:  Diagnosis Date   AICD (automatic cardioverter/defibrillator) present    Calcific tendonitis 2008   Treatment of left leg   Cardiomyopathy (HCC) 06/2017   a) Echo: EF 25-30%. GR 1-2 DD w/ elevated LVEDP. Mild valvular Dz; b) Cardiac MRI 2/'19: frequent PVCs (diffiuclt to interpret) - EF ~27% w/ diffuse HK.  No evidence of infarct, infiltrative Dz or myocarditis. -- ? if related to PVCs. c) f/u Echo 6/'19: EF 35-40%. Gr 1 DD. Diffuse HK.-> d) Jan 2020 EF 50 to 55%.  Mod MAC, mod LAE.  Ao Sclerosis; e) Echo 1/'21 - EF 60-65%, Gr II DD. Mild Mod MR.    Chronic combined systolic and diastolic CHF, NYHA class 2 and ACA/AHA stage C    Now essentially resolved, back to simply diastolic CHF   Coronary artery disease, non-occlusive    mild-moderate CAD 06/30/17 cath   CTS (carpal tunnel syndrome)    Diabetes mellitus    Type II   Dysrhythmia    patient said that she cant remember what it is   Frequent unifocal PVCs 01/2018   Event Monitor: NSR, W/ frequent multifocal PVCs (9%-down from 30% prior to amiodarone) and PACs.  Nighttime bradycardia suggestive of OSA.';  Zio patch September 2023: PVC burden now 6.1%.   Hiatal hernia    with reflux   Hyperlipidemia     Statin intolerant   Hypertension    Ichthyosis congenita    Intermittent claudication of both lower extremities due to atherosclerosis (HCC) 08/22/2021   LEA Dopplers 09/09/2021:  Right: Total occlusion noted in the superficial femoral artery. Atherosclerosis noted throughout extremity, see note above. Three vessel runoff.  Left: Total occlusion noted in the superficial femoral artery and/or popliteal artery. Total occlusion noted in the distal anterior tibial artery. Athereosclerosis noted throughout extremity.    NSVD (normal spontaneous vaginal delivery) 1971 & 1972   Obesity    Plantar fasciitis    Left   Presence of permanent cardiac pacemaker    Pulmonary nodule    Imaged multiple times and benign appearing   Sleep apnea    Wears glasses     Past Surgical History:  Procedure Laterality Date   ABDOMINAL HYSTERECTOMY  1975   TAH-BSO   ABSCESS DRAINAGE  1972   right breast   APPLICATION OF WOUND VAC Right 01/15/2023   Procedure: APPLICATION OF INCISIONAL WOUND VAC;  Surgeon: Victorino Sparrow, MD;  Location: [facility] OR;  Service: Vascular;  Laterality: Right;   APPLICATION OF WOUND VAC Right 01/26/2023   Procedure: APPLICATION OF WOUND VAC RIGHT GROIN WASHOUT AND SARTORIUS MUSCLE FLAP;  Surgeon: Victorino Sparrow, MD;  Location: [facility] OR;  Service: Vascular;  Laterality: Right;   ARTERY REPAIR  Right 01/15/2023   Procedure: RIGHT PROFUNDA ARTERY REPAIR WITH 2000G MYRIAD MORCELLS;  Surgeon: Victorino Sparrow, MD;  Location: [facility] OR;  Service: Vascular;  Laterality: Right;   BREAST BIOPSY Left 01/14/2009   Stereo- Benign   CARDIAC MRI  01/2018   Difficult to interpret 2/2 PVCs. Normal LV size - EF ~27% with diffuse HK. Mild RV dilation - normal fxn. -- NO MYOCARDIAL LGI => no definitive evidence of prior MI, Infiltrative Dz or Myocarditis -- suspect NICM, possibly related to PVCs.   CARPAL TUNNEL RELEASE Right 07/02/2014   Procedure: RIGHT CARPAL TUNNEL RELEASE;  Surgeon: Nicki Reaper, MD;   Location: [facility];  Service: Orthopedics;  Laterality: Right;   CARPAL TUNNEL RELEASE Left 06/11/2015   Procedure: LEFT CARPAL TUNNEL RELEASE;  Surgeon: Cindee Salt, MD;  Location:  SURGERY CENTER;  Service: Orthopedics;  Laterality: Left;  REGIONAL/FAB   COLONOSCOPY     corn removal  1968   right   CORONARY STENT INTERVENTION N/A 01/10/2023   Procedure: CORONARY STENT INTERVENTION;  Surgeon: Lennette Bihari, MD;  Location: [facility] INVASIVE CV LAB;  Service: Cardiovascular;  Laterality: N/A;   GROIN DEBRIDEMENT Right 02/06/2023   Procedure: RIGHT GROIN DEBRIDEMENT WITH WOUND VAC CHANGE;  Surgeon: Victorino Sparrow, MD;  Location: [facility] OR;  Service: Vascular;  Laterality: Right;   HEMATOMA EVACUATION Right 01/15/2023   Procedure: EVACUATION HEMATOMA RIGHT GROIN;  Surgeon: Victorino Sparrow, MD;  Location: [facility] OR;  Service: Vascular;  Laterality: Right;   Holter Monitor  06/2017   ~17,000 PVC beats - majority were singlets, some couplets. 44 brief 3-7 beat runs of NSVT. Also noted were less frequent PACs with 11 runs (longest 15 beats)   IABP INSERTION Right 01/10/2023   Procedure: IABP Insertion;  Surgeon: Lennette Bihari, MD;  Location: [facility] INVASIVE CV LAB;  Service: Cardiovascular;  Laterality: Right;   ICD IMPLANT N/A 01/23/2023   Procedure: ICD IMPLANT;  Surgeon: Marinus Maw, MD;  Location: [facility] INVASIVE CV LAB;  Service: Cardiovascular;  Laterality: N/A;   INCISION AND DRAINAGE OF WOUND Right 01/31/2023   Procedure: IRRIGATION AND DEBRIDEMENT RIGHT GROIN WOUND AND WOUND VAC CHANGE;  Surgeon: Victorino Sparrow, MD;  Location: [facility] OR;  Service: Vascular;  Laterality: Right;   OPEN REDUCTION INTERNAL FIXATION (ORIF) DISTAL RADIAL FRACTURE Left 07/21/2017   Procedure: OPEN REDUCTION INTERNAL FIXATION (ORIF) LEFT DISTAL RADIAL FRACTURE;  Surgeon: Sheral Apley, MD;  Location: [facility] OR;  Service: Orthopedics;  Laterality: Left;   RIGHT/LEFT HEART CATH AND CORONARY ANGIOGRAPHY N/A  06/30/2017   Procedure: Right/Left Heart Cath and Coronary Angiography;  Surgeon: Marykay Lex, MD;  Location: [facility] INVASIVE CV LAB:  pRCA 55%, pCx 40%, OM1 45%, mCx 50%, D2 50% - LVEF 25-35%. Moderately elevated LVEDP(26 mmHg with PCWP 16 mmHg).  FICK CO/CI: 4.47/2.48. PA pressures 47/14 mmHg with a mean of 27 mm.   RIGHT/LEFT HEART CATH AND CORONARY ANGIOGRAPHY N/A 01/10/2023   Procedure: RIGHT/LEFT HEART CATH AND CORONARY ANGIOGRAPHY;  Surgeon: Lennette Bihari, MD;  Location: [facility] INVASIVE CV LAB;  Service: Cardiovascular;  Laterality: N/A;   ROTATOR CUFF REPAIR     left shoulder   TEMPORARY PACEMAKER N/A 01/19/2023   Procedure: TEMPORARY PACEMAKER;  Surgeon: Dolores Patty, MD;  Location: [facility] INVASIVE CV LAB;  Service: Cardiovascular;  Laterality: N/A;   TRANSTHORACIC ECHOCARDIOGRAM  06/2017   EF 25 and 30%. GR 1 DD. Mild diastolic dysfunction with elevated LVEDP. Mild  valvular disease.   TRANSTHORACIC ECHOCARDIOGRAM  11'18, 6/'19    a) EF remains 30-35%.  Diffuse hypokinesis noted.  Severe LA dilation; b) Improved EF 35-40%.  Diffuse HK.  GR 1 DD.  Moderate TR.  Mild RV dilation.   TRANSTHORACIC ECHOCARDIOGRAM  01/28/2022   Follow-up echo: EF 50 to 55%.  No RWMA.  GRII DD.  Severe LA dilation.  Mild RA dilation with normal PAP and RAP.  Mild to moderate TR (no significant change noted)   Zio patch  08/2022   3-day: Currently NSR (HR range 47-91 with average 64 bpm) 1 F AVB with IVCD/BBB.  Rare isolated PACs (<1%).  Frequent PVCs (6.1% with rare couplets and triplets.  Also bigeminy.  2 atrial runs 8 beats 5 2 seconds.  No sustained arrhythmias.   ZIO Patch 14 d Event Monitor  10/2018   Results as below < 1% PVC.  (Notably improved after starting amiodarone)     Social History   Tobacco Use   Smoking status: Former    Years: 28    Types: Cigarettes    Quit date: 12/12/1993    Years since quitting: 29.4    Passive exposure: Never   Smokeless tobacco: Never  Vaping Use   Vaping Use:  Never used  Substance Use Topics   Alcohol use: No    Alcohol/week: 0.0 standard drinks of alcohol   Drug use: No      Review of Systems  A complete ROS was performed with pertinent positives/negatives noted in the HPI.   Vitals:   05/22/2023 2239 05/14/2023 2245  BP: 118/82 106/70  Pulse: (!) 103 (!) 28  Resp: (!) 27 (!) 26  Temp: (!) 96.4 F (35.8 C)   SpO2: 93% 91%    Physical Exam Vitals and nursing note reviewed.  Constitutional:      Appearance: She is well-developed. She is ill-appearing.  HENT:     Head: Normocephalic and atraumatic.  Cardiovascular:     Rate and Rhythm: Regular rhythm. Tachycardia present.     Pulses: Normal pulses.     Heart sounds: Normal heart sounds. No murmur heard.    No friction rub. No gallop.  Pulmonary:     Effort: Tachypnea and respiratory distress present.     Breath sounds: Rhonchi present.  Abdominal:     General: Abdomen is flat. There is no distension.     Palpations: Abdomen is soft.     Tenderness: There is no abdominal tenderness. There is no guarding or rebound.  Musculoskeletal:        General: No swelling.     Cervical back: Neck supple.  Skin:    General: Skin is warm and dry.     Capillary Refill: Capillary refill takes less than 2 seconds.  Neurological:     Mental Status: She is lethargic.     GCS: GCS eye subscore is 1. GCS verbal subscore is 1. GCS motor subscore is 4.     Procedures  MDM:  Imaging/radiology results:  DG Chest Portable 1 View  Result Date: 05/21/2023 CLINICAL DATA:  Hypoglycemia EXAM: PORTABLE CHEST 1 VIEW COMPARISON:  04/25/2023 FINDINGS: Cardiac shadow is enlarged. Defibrillator is again seen and stable. Aortic calcifications are noted. Right PICC is noted with the catheter tip at the cavoatrial junction. Mild central vascular congestion is noted. No focal infiltrate or effusion is seen. IMPRESSION: Mild central vascular congestion. Electronically Signed   By: Alcide Clever M.D.   On:  06/03/2023 22:54  Lab results:  Results for orders placed or performed during the hospital encounter of 06/10/2023 (from the past 24 hour(s))  CBG monitoring, ED     Status: Abnormal   Collection Time: 05/28/2023 10:37 PM  Result Value Ref Range   Glucose-Capillary 240 (H) 70 - 99 mg/dL  CBC with Differential     Status: Abnormal   Collection Time: 06/08/2023 10:52 PM  Result Value Ref Range   WBC 6.5 4.0 - 10.5 K/uL   RBC 4.34 3.87 - 5.11 MIL/uL   Hemoglobin 11.5 (L) 12.0 - 15.0 g/dL   HCT 60.4 (L) 54.0 - 98.1 %   MCV 82.7 80.0 - 100.0 fL   MCH 26.5 26.0 - 34.0 pg   MCHC 32.0 30.0 - 36.0 g/dL   RDW 19.1 (H) 47.8 - 29.5 %   Platelets 205 150 - 400 K/uL   nRBC 1.5 (H) 0.0 - 0.2 %   Neutrophils Relative % 87 %   Neutro Abs 5.6 1.7 - 7.7 K/uL   Lymphocytes Relative 3 %   Lymphs Abs 0.2 (L) 0.7 - 4.0 K/uL   Monocytes Relative 9 %   Monocytes Absolute 0.6 0.1 - 1.0 K/uL   Eosinophils Relative 0 %   Eosinophils Absolute 0.0 0.0 - 0.5 K/uL   Basophils Relative 0 %   Basophils Absolute 0.0 0.0 - 0.1 K/uL   Immature Granulocytes 1 %   Abs Immature Granulocytes 0.07 0.00 - 0.07 K/uL   *Note: Due to a large number of results and/or encounters for the requested time period, some results have not been displayed. A complete set of results can be found in Results Review.     Key medications administered in the ER:  Medications  dextrose 50 % solution 50 mL (50 mLs Intravenous Given 06/02/2023 2236)  glucagon (human recombinant) (GLUCAGEN) injection 1 mg (1 mg Intravenous Given 05/17/2023 2235)    Medical decision making: -Vital signs stable. Patient afebrile, hemodynamically stable, and non-toxic appearing. -Patient's presentation is most consistent with acute presentation with potential threat to life or bodily function.. SUMIYE WASKEY is a 82 y.o. female presenting to the emergency department with hypoglycemia, shortness of breath, altered mental status as above.  -Additional  history obtained from EMS. -On arrival patient is hypoglycemic therefore given additional glucagon.  IV access has been obtained and dextrose has been given.  Sugar has corrected.  Patient is a GCS of 6 and arrives with DNR paperwork.  Prior to any invasive procedures I immediately went to the patient's family to have discussions on goals of care.  After extensive initial discussions with the patient's family we will hold on intubation as they do not feel the patient would have wanted this.  They confirmed they do not want chest compressions.  They are having additional discussions with their entire family to make decisions on additional care.  Upon reassessment, I have brought the patient's family back to bedside with the patient.  We have had further discussions and they feel as though the patient would benefit from comfort care and they feels that comfort care would be her wishes at this time.  They do not feel as though she would want invasive measures at this time such as intubation, chest compressions, vasoactive medications, or anything that would "poked and prod at her."  They feel as though it would be in her best interest to keep her comfortable as they feel she is at the end stage of her life.  These discussions were  had with the patient's husband, granddaughter, grandson, and sister.  Will proceed to admission for comfort care/hospice.  I have discussed the patient with medicine who will admit for comfort care.  Patient admitted.   Medical Decision Making Amount and/or Complexity of Data Reviewed Labs: ordered. Radiology: ordered.  Risk Prescription drug management. Decision regarding hospitalization.     The plan for this patient was discussed with Dr. Jodi Mourning, who voiced agreement and who oversaw evaluation and treatment of this patient.  Marta Lamas, MD Emergency Medicine, PGY-3  Note: Dragon medical dictation software was used in the creation of this note.   Clinical  Impression:  1. Hypoxia   2. Tachypnea   3. Hypoglycemia          Chase Caller, MD 05/20/2023 1214    Blane Ohara, MD 06/01/23 [phone]

## 2023-05-29 NOTE — Telephone Encounter (Signed)
I put in the referral.  Thanks.  

## 2023-05-29 NOTE — ED Triage Notes (Signed)
Patient arrives with a blood sugar of 52 Glucagon given in route. Patient arrives on NRB at 15L, alerted mental status, with DNR. Patient has a pacemaker. Patient is non-responsive to verbal stimuli. Last BS 69.

## 2023-05-29 NOTE — Telephone Encounter (Signed)
Spoke with [facility] whom she currently has palliative care with to inquire about getting patient started on hospice. Patient will need a referral for this as they do their own hospice evaluations to see what they qualify for. This can not be a verbal order.

## 2023-05-29 NOTE — Telephone Encounter (Signed)
Please call hospice about eval for patient.  Her functional status has declined and she has persistent nausea/vomiting and diarrhea.  She consented for hospice eval.  Let me know if she needs a referral.  Thanks.

## 2023-05-29 NOTE — Progress Notes (Unsigned)
Here today with her husband.  Diarrhea daily, going on for weeks.  No blood in stool.  Watery stools.  Vomiting episodically.  Diffuse abd pain, most of the time.  She feels bloated.  More SOB from prior baseline.   She is still in the midst of milrinone drip per cardiology for heart failure.  We talked about her goals.  She affirms DNR status and wants to avoid hospitalization.  D/w pt about hospice eval and she consents for that.  Referral placed today.  Discussed trying to get nausea and vomiting along with diarrhea under control in the near future.  Meds, vitals, and allergies reviewed.   ROS: Per HPI unless specifically indicated in ROS section   Nad [place] Neck supple, no LA Ctab RRR with occasional ectopy noted. Abdomen soft.  Positive bowel sounds Extremities without edema. 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 Milrinone drip via PICC in right arm.  30 minutes were devoted to patient care in this encounter (this includes time spent reviewing the patient's file/history, interviewing and examining the patient, counseling/reviewing plan with patient).

## 2023-05-30 ENCOUNTER — Encounter (HOSPITAL_COMMUNITY): Payer: Self-pay | Admitting: Internal Medicine

## 2023-05-30 DIAGNOSIS — Z515 Encounter for palliative care: Secondary | ICD-10-CM | POA: Diagnosis present

## 2023-05-30 DIAGNOSIS — E1151 Type 2 diabetes mellitus with diabetic peripheral angiopathy without gangrene: Secondary | ICD-10-CM | POA: Diagnosis present

## 2023-05-30 DIAGNOSIS — Z95810 Presence of automatic (implantable) cardiac defibrillator: Secondary | ICD-10-CM

## 2023-05-30 DIAGNOSIS — I482 Chronic atrial fibrillation, unspecified: Secondary | ICD-10-CM | POA: Diagnosis present

## 2023-05-30 DIAGNOSIS — I071 Rheumatic tricuspid insufficiency: Secondary | ICD-10-CM | POA: Diagnosis present

## 2023-05-30 DIAGNOSIS — Z7901 Long term (current) use of anticoagulants: Secondary | ICD-10-CM | POA: Diagnosis not present

## 2023-05-30 DIAGNOSIS — N184 Chronic kidney disease, stage 4 (severe): Secondary | ICD-10-CM | POA: Diagnosis present

## 2023-05-30 DIAGNOSIS — E876 Hypokalemia: Secondary | ICD-10-CM | POA: Diagnosis present

## 2023-05-30 DIAGNOSIS — I480 Paroxysmal atrial fibrillation: Secondary | ICD-10-CM | POA: Diagnosis present

## 2023-05-30 DIAGNOSIS — J849 Interstitial pulmonary disease, unspecified: Secondary | ICD-10-CM

## 2023-05-30 DIAGNOSIS — Z66 Do not resuscitate: Secondary | ICD-10-CM | POA: Diagnosis present

## 2023-05-30 DIAGNOSIS — D631 Anemia in chronic kidney disease: Secondary | ICD-10-CM | POA: Diagnosis present

## 2023-05-30 DIAGNOSIS — E872 Acidosis, unspecified: Secondary | ICD-10-CM | POA: Diagnosis present

## 2023-05-30 DIAGNOSIS — I5082 Biventricular heart failure: Secondary | ICD-10-CM | POA: Diagnosis present

## 2023-05-30 DIAGNOSIS — I429 Cardiomyopathy, unspecified: Secondary | ICD-10-CM | POA: Diagnosis present

## 2023-05-30 DIAGNOSIS — G4739 Other sleep apnea: Secondary | ICD-10-CM | POA: Diagnosis present

## 2023-05-30 DIAGNOSIS — Z8679 Personal history of other diseases of the circulatory system: Secondary | ICD-10-CM

## 2023-05-30 DIAGNOSIS — N179 Acute kidney failure, unspecified: Secondary | ICD-10-CM | POA: Diagnosis present

## 2023-05-30 DIAGNOSIS — E1165 Type 2 diabetes mellitus with hyperglycemia: Secondary | ICD-10-CM | POA: Diagnosis present

## 2023-05-30 DIAGNOSIS — E785 Hyperlipidemia, unspecified: Secondary | ICD-10-CM | POA: Diagnosis present

## 2023-05-30 DIAGNOSIS — G4731 Primary central sleep apnea: Secondary | ICD-10-CM

## 2023-05-30 DIAGNOSIS — Z87891 Personal history of nicotine dependence: Secondary | ICD-10-CM | POA: Diagnosis not present

## 2023-05-30 DIAGNOSIS — E1122 Type 2 diabetes mellitus with diabetic chronic kidney disease: Secondary | ICD-10-CM | POA: Diagnosis present

## 2023-05-30 DIAGNOSIS — I5043 Acute on chronic combined systolic (congestive) and diastolic (congestive) heart failure: Secondary | ICD-10-CM | POA: Diagnosis present

## 2023-05-30 DIAGNOSIS — E11649 Type 2 diabetes mellitus with hypoglycemia without coma: Secondary | ICD-10-CM | POA: Diagnosis present

## 2023-05-30 DIAGNOSIS — I130 Hypertensive heart and chronic kidney disease with heart failure and stage 1 through stage 4 chronic kidney disease, or unspecified chronic kidney disease: Secondary | ICD-10-CM | POA: Diagnosis present

## 2023-05-30 LAB — CBG MONITORING, ED: Glucose-Capillary: 103 mg/dL — ABNORMAL HIGH (ref 70–99)

## 2023-05-30 MED ORDER — GLYCOPYRROLATE 0.2 MG/ML IJ SOLN: 0.1000 mg | INTRAMUSCULAR | Status: DC | PRN

## 2023-05-30 MED ORDER — HALOPERIDOL LACTATE 5 MG/ML IJ SOLN: 5.0000 mg | Freq: Four times a day (QID) | INTRAMUSCULAR | Status: DC | PRN

## 2023-05-30 MED ORDER — ATROPINE SULFATE 1 % OP SOLN: 4.0000 [drp] | OPHTHALMIC | Status: DC | PRN

## 2023-05-30 MED ORDER — HALOPERIDOL LACTATE 2 MG/ML PO CONC
0.5000 mg | ORAL | Status: DC | PRN
  Filled 2023-05-30: qty 5

## 2023-05-30 MED ORDER — BIOTENE DRY MOUTH MT LIQD: 15.0000 mL | OROMUCOSAL | Status: DC | PRN

## 2023-05-30 MED ORDER — DIPHENHYDRAMINE HCL 50 MG/ML IJ SOLN: 12.5000 mg | INTRAMUSCULAR | Status: DC | PRN

## 2023-05-30 MED ORDER — ONDANSETRON HCL 4 MG/2ML IJ SOLN: 4.0000 mg | Freq: Four times a day (QID) | INTRAMUSCULAR | Status: DC | PRN

## 2023-05-30 MED ORDER — HALOPERIDOL 0.5 MG PO TABS: 0.5000 mg | ORAL_TABLET | ORAL | Status: DC | PRN

## 2023-05-30 MED ORDER — MORPHINE SULFATE (PF) 2 MG/ML IV SOLN
2.0000 mg | INTRAVENOUS | Status: DC | PRN
  Administered 2023-05-30 (×2): 2 mg via INTRAVENOUS
  Filled 2023-05-30 (×2): qty 1

## 2023-05-30 MED ORDER — HYDROMORPHONE HCL 1 MG/ML IJ SOLN: 1.0000 mg | INTRAMUSCULAR | Status: DC | PRN

## 2023-05-30 MED ORDER — ONDANSETRON 4 MG PO TBDP: 4.0000 mg | ORAL_TABLET | Freq: Four times a day (QID) | ORAL | Status: DC | PRN

## 2023-05-30 MED ORDER — POLYVINYL ALCOHOL 1.4 % OP SOLN: 1.0000 [drp] | Freq: Four times a day (QID) | OPHTHALMIC | Status: DC | PRN

## 2023-05-30 MED ORDER — LORAZEPAM 2 MG/ML IJ SOLN: 1.0000 mg | INTRAMUSCULAR | Status: DC | PRN

## 2023-05-30 MED ORDER — SODIUM CHLORIDE 0.9 % IV SOLN
1.0000 mg/h | INTRAVENOUS | Status: DC
  Administered 2023-05-30: 1 mg/h via INTRAVENOUS
  Filled 2023-05-30: qty 5

## 2023-05-31 ENCOUNTER — Encounter (HOSPITAL_COMMUNITY): Payer: Medicare Other

## 2023-05-31 NOTE — Assessment & Plan Note (Signed)
She does not feel well and we talked about her goals.  She wants to avoid hospitalization, she reaffirms DNR status.  She wants to get her nausea and vomiting and diarrhea under control.  We talked about getting hospice evaluation at home.  Referral placed. Reasonable to try using a scopolamine patch for nausea and vomiting.  Try using imodium 2mg  as needed for loose stools or diarrhea.   Patient and husband agree with plan.

## 2023-06-01 ENCOUNTER — Telehealth: Payer: Self-pay | Admitting: Family Medicine

## 2023-06-01 DIAGNOSIS — Z95810 Presence of automatic (implantable) cardiac defibrillator: Secondary | ICD-10-CM | POA: Diagnosis not present

## 2023-06-01 DIAGNOSIS — I2510 Atherosclerotic heart disease of native coronary artery without angina pectoris: Secondary | ICD-10-CM | POA: Diagnosis not present

## 2023-06-01 DIAGNOSIS — I5043 Acute on chronic combined systolic (congestive) and diastolic (congestive) heart failure: Secondary | ICD-10-CM | POA: Diagnosis not present

## 2023-06-01 DIAGNOSIS — I4729 Other ventricular tachycardia: Secondary | ICD-10-CM | POA: Diagnosis not present

## 2023-06-01 NOTE — Telephone Encounter (Signed)
Called her sister and gave my condolences.  I was always glad to see this kind lady in clinic.  Her sister thanked me for the call.  Called and left message on voicemail for patient's husband.

## 2023-06-05 ENCOUNTER — Encounter: Payer: Medicare Other | Admitting: Pharmacist

## 2023-06-07 ENCOUNTER — Other Ambulatory Visit (HOSPITAL_COMMUNITY): Payer: Self-pay | Admitting: Cardiology

## 2023-06-07 ENCOUNTER — Encounter (HOSPITAL_BASED_OUTPATIENT_CLINIC_OR_DEPARTMENT_OTHER): Payer: Medicare Other | Admitting: Pulmonary Disease

## 2023-06-12 NOTE — Death Summary Note (Signed)
DEATH SUMMARY   Patient Details  Name: Natasha Woodard MRN: [id number] DOB: 07-20-1941 [id number], Dwana Curd, MD Admission/Discharge Information   Admit Date:  June 05, 2023  Date of Death: Date of Death: June 06, 2023  Time of Death: Time of Death: 1435  Length of Stay: 0   Principle Cause of death: Acute on chronic combined CHF  Hospital Diagnoses: Principal Problem:   End of life care Active Problems:   Acute kidney injury superimposed on chronic kidney disease (HCC)   Acute renal failure superimposed on stage 4 chronic kidney disease (HCC)   Complex sleep apnea syndrome   ILD (interstitial lung disease) (HCC)   Acute on chronic combined systolic and diastolic CHF (congestive heart failure) (HCC)   ICD (implantable cardioverter-defibrillator) in place   Hypokalemia   History of ventricular tachycardia   Tricuspid valve regurgitation   Chronic a-fib [facility])   Hospital Course: 82 year old F with PMH of combined CHF on milrinone, VT arrest s/p ICD, CAD s/p PCI, moderate to severe MR, paroxysmal A-fib on Eliquis, CKD-4, sleep apnea and ILD presented to ED with progressive shortness of breath, edema, orthopnea and PND, and admitted for end-of-life care.  Patient is followed by palliative medicine outpatient.  She is on milrinone for inotropic support for his CHF.  In ED, slightly low temperature to 96.4 but resolved.  Slightly tachycardic with tachypnea.  Required nonrebreather at some point.  Labs with mild hypokalemia, mild hyperglycemia, AKI and mild anemia.  After discussion between admitting provider and patient's husband, she was admitted for end-of-life care and started on IV morphine and other medications for symptomatic management.  The next day, she had increased work of breathing.  After discussion with family including husband, son, daughter, sister and other family members, ICD activated.  She was started on IV Dilaudid drip with as needed IV Dilaudid.  Eventually, patient  passed away peacefully at 2:35 PM with multiple family members at bedside.   Procedures: None  Consultations: None  The results of significant diagnostics from this hospitalization (including imaging, microbiology, ancillary and laboratory) are listed below for reference.   Significant Diagnostic Studies: DG Chest Portable 1 View  Result Date: 06/05/23 CLINICAL DATA:  Hypoglycemia EXAM: PORTABLE CHEST 1 VIEW COMPARISON:  04/25/2023 FINDINGS: Cardiac shadow is enlarged. Defibrillator is again seen and stable. Aortic calcifications are noted. Right PICC is noted with the catheter tip at the cavoatrial junction. Mild central vascular congestion is noted. No focal infiltrate or effusion is seen. IMPRESSION: Mild central vascular congestion. Electronically Signed   By: Alcide Clever M.D.   On: 05-Jun-2023 22:54   [place] EKG Site Rite  Result Date: 05/08/2023 If Site Rite image not attached, placement could not be confirmed due to current cardiac rhythm.  CUP PACEART REMOTE DEVICE CHECK  Result Date: 05/02/2023 Scheduled remote reviewed. Normal device function.  Alert status for sustained VT with therapy, 5/19 @ 11:07 EGM shows sustained VT, rate 222, ATP delivered x1 without success, followed by 41J HV therapy converting arrhythmia, duration 5sec. There is a second sustained VT event 5/19 @ 11:52, sustained VT treated with 2 bursts of ATP unsuccessful, followed by 41J HV therapy converting arrhythmia, rate 231, duration 26sec.  2 additional NSVT without therapy ATR events show AF, longest duration 30sec, burden <1 %, Eliquis per PA Next remote 91 days. Route to triage LA, CVRSMay 19, 2024 11:52 - Yellow Alert - Accelerated ventricular arrhythmia episode. Apr 30, 2023 11:52 - Yellow Alert - Antitachycardia pacing (ATP)  therapy delivered to convert arrhythmia. Apr 30, 2023 11:52 - Yellow Alert - Ventricular shock therapy delivered to convert arrhythmia. Apr 30, 2023 11:07 - Yellow Alert -  Accelerated ventricular arrhythmia episode. Apr 30, 2023 11:07 - Yellow Alert - Antitachycardia pacing (ATP) therapy delivered to convert arrhythmia. Apr 30, 2023 11:07 - Yellow Alert - Ventricular shock therapy delivered to convert arrhythmia.   Microbiology: No results found for this or any previous visit (from the past 240 hour(s)).  Time spent: 45 minutes  Signed: Almon Hercules, MD 05/24/2023

## 2023-06-12 NOTE — Progress Notes (Signed)
[employer] rep to come deactivate patient's ICD. Awaiting arrival.

## 2023-06-12 NOTE — H&P (Signed)
History and Physical      Natasha Woodard:[id number] DOB: 12-10-1941 DOA: 06/02/2023; DOS: 05/19/2023  PCP: [name], MD  Patient coming from: home   I have personally briefly reviewed patient's old medical records in [facility]  Chief Complaint: sob  HPI: Natasha Woodard is a 82 y.o. female with medical history significant for chronic systolic/diastolic heart failure, chronic biventricular heart failure, paroxysmal atrial fibrillation chronically anticoagulated on Eliquis, stage IV CKD stage II baseline creatinine range 2-3, who is admitted to [facility] on 05/28/2023 for initiation of comfort care measures in the setting of acute on chronic systolic/diastolic heart failure.   In the setting of the patient's altered mental status, history is provided by the patient's husband as well as via my discussions with the EDP and via chart review.  Patient has history of chronic systolic/diastolic heart failure as well as chronic biventricular heart failure, with most recent echocardiogram on 04/06/2023 notable for severely reduced LVEF of less than 20%, severe global hypokinesis, mildly dilated left ventricular cavity size, grade 3 diastolic dysfunction, moderately reduced right ventricular systolic function, and severely dilated left atrium.  She has previously been on milrinone in the setting, as well as torsemide.   However, in spite of these interventions, has been conveys that the patient has been experiencing Progressive shortness of breath, worsening peripheral edema, orthopnea, PND.   The patient's husband, who is her medical power of attorney, confirms that the patient is to be comfort care measures only, confirming DNR/DNI, as well as no life-prolonging/life preserving measures.  Husband conveys that at this point, the patient would not want any additional diagnostic evaluation, including refusal of EKG and refusal of any additional blood draws as well as not  wanting any additional imaging.    ED Course:  Vital signs in the ED were notable for the following: Afebrile; heart rates in the 90s to low 100s; stop blood pressures in the low 100s to 1 teens; respiratory rate 22-27, oxygen saturation 93 to 99% on nonrebreather, which was initiated for patient comfort in the ED.  Labs were notable for the following: BMP notable for bicarbonate 17, anion gap 28, creatinine 6.95 compared to most recent prior value 3.08 on 05/17/2023.  Per my interpretation, EKG in ED demonstrated the following:  (No EKG performed, per family's wishes)  Imaging and additional notable ED work-up: Chest x-ray, per formal radiology read showed evidence of central vascular congestion.  Subsequently, the patient was admitted for initiation of comfort care measures in the setting of acute on chronic systolic/diastolic heart failure complicated by acute renal failure superimposed on stage IV CKD, with additional labs notable for anion gap metabolic acidosis.     Review of Systems: As per HPI otherwise 10 point review of systems negative.   Past Medical History:  Diagnosis Date   AICD (automatic cardioverter/defibrillator) present    Calcific tendonitis 2008   Treatment of left leg   Cardiomyopathy (HCC) 06/2017   a) Echo: EF 25-30%. GR 1-2 DD w/ elevated LVEDP. Mild valvular Dz; b) Cardiac MRI 2/'19: frequent PVCs (diffiuclt to interpret) - EF ~27% w/ diffuse HK.  No evidence of infarct, infiltrative Dz or myocarditis. -- ? if related to PVCs. c) f/u Echo 6/'19: EF 35-40%. Gr 1 DD. Diffuse HK.-> d) Jan 2020 EF 50 to 55%.  Mod MAC, mod LAE.  Ao Sclerosis; e) Echo 1/'21 - EF 60-65%, Gr II DD. Mild Mod MR.    Chronic combined systolic  and diastolic CHF, NYHA class 2 and ACA/AHA stage C    Now essentially resolved, back to simply diastolic CHF   Coronary artery disease, non-occlusive    mild-moderate CAD 06/30/17 cath   CTS (carpal tunnel syndrome)    Diabetes mellitus    Type II    Dysrhythmia    patient said that she cant remember what it is   Frequent unifocal PVCs 01/2018   Event Monitor: NSR, W/ frequent multifocal PVCs (9%-down from 30% prior to amiodarone) and PACs.  Nighttime bradycardia suggestive of OSA.';  Zio patch September 2023: PVC burden now 6.1%.   Hiatal hernia    with reflux   Hyperlipidemia    Statin intolerant   Hypertension    Ichthyosis congenita    Intermittent claudication of both lower extremities due to atherosclerosis (HCC) 08/22/2021   LEA Dopplers 09/09/2021:  Right: Total occlusion noted in the superficial femoral artery. Atherosclerosis noted throughout extremity, see note above. Three vessel runoff.  Left: Total occlusion noted in the superficial femoral artery and/or popliteal artery. Total occlusion noted in the distal anterior tibial artery. Athereosclerosis noted throughout extremity.    NSVD (normal spontaneous vaginal delivery) 1971 & 1972   Obesity    Plantar fasciitis    Left   Presence of permanent cardiac pacemaker    Pulmonary nodule    Imaged multiple times and benign appearing   Sleep apnea    Wears glasses     Past Surgical History:  Procedure Laterality Date   ABDOMINAL HYSTERECTOMY  1975   TAH-BSO   ABSCESS DRAINAGE  1972   right breast   APPLICATION OF WOUND VAC Right 01/15/2023   Procedure: APPLICATION OF INCISIONAL WOUND VAC;  Surgeon: Victorino Sparrow, MD;  Location: [facility] OR;  Service: Vascular;  Laterality: Right;   APPLICATION OF WOUND VAC Right 01/26/2023   Procedure: APPLICATION OF WOUND VAC RIGHT GROIN WASHOUT AND SARTORIUS MUSCLE FLAP;  Surgeon: Victorino Sparrow, MD;  Location: [facility] OR;  Service: Vascular;  Laterality: Right;   ARTERY REPAIR Right 01/15/2023   Procedure: RIGHT PROFUNDA ARTERY REPAIR WITH 2000G MYRIAD MORCELLS;  Surgeon: Victorino Sparrow, MD;  Location: [facility] OR;  Service: Vascular;  Laterality: Right;   BREAST BIOPSY Left 01/14/2009   Stereo- Benign   CARDIAC MRI  01/2018   Difficult to  interpret 2/2 PVCs. Normal LV size - EF ~27% with diffuse HK. Mild RV dilation - normal fxn. -- NO MYOCARDIAL LGI => no definitive evidence of prior MI, Infiltrative Dz or Myocarditis -- suspect NICM, possibly related to PVCs.   CARPAL TUNNEL RELEASE Right 07/02/2014   Procedure: RIGHT CARPAL TUNNEL RELEASE;  Surgeon: Nicki Reaper, MD;  Location: [facility];  Service: Orthopedics;  Laterality: Right;   CARPAL TUNNEL RELEASE Left 06/11/2015   Procedure: LEFT CARPAL TUNNEL RELEASE;  Surgeon: Cindee Salt, MD;  Location: [facility];  Service: Orthopedics;  Laterality: Left;  REGIONAL/FAB   COLONOSCOPY     corn removal  1968   right   CORONARY STENT INTERVENTION N/A 01/10/2023   Procedure: CORONARY STENT INTERVENTION;  Surgeon: Lennette Bihari, MD;  Location: [facility] INVASIVE CV LAB;  Service: Cardiovascular;  Laterality: N/A;   GROIN DEBRIDEMENT Right 02/06/2023   Procedure: RIGHT GROIN DEBRIDEMENT WITH WOUND VAC CHANGE;  Surgeon: Victorino Sparrow, MD;  Location: [facility] OR;  Service: Vascular;  Laterality: Right;   HEMATOMA EVACUATION Right 01/15/2023   Procedure: EVACUATION HEMATOMA RIGHT GROIN;  Surgeon: Gerarda Fraction  E, MD;  Location: [facility] OR;  Service: Vascular;  Laterality: Right;   Holter Monitor  06/2017   ~17,000 PVC beats - majority were singlets, some couplets. 44 brief 3-7 beat runs of NSVT. Also noted were less frequent PACs with 11 runs (longest 15 beats)   IABP INSERTION Right 01/10/2023   Procedure: IABP Insertion;  Surgeon: Lennette Bihari, MD;  Location: [facility] INVASIVE CV LAB;  Service: Cardiovascular;  Laterality: Right;   ICD IMPLANT N/A 01/23/2023   Procedure: ICD IMPLANT;  Surgeon: Marinus Maw, MD;  Location: [facility] INVASIVE CV LAB;  Service: Cardiovascular;  Laterality: N/A;   INCISION AND DRAINAGE OF WOUND Right 01/31/2023   Procedure: IRRIGATION AND DEBRIDEMENT RIGHT GROIN WOUND AND WOUND VAC CHANGE;  Surgeon: Victorino Sparrow, MD;  Location: [facility] OR;  Service:  Vascular;  Laterality: Right;   OPEN REDUCTION INTERNAL FIXATION (ORIF) DISTAL RADIAL FRACTURE Left 07/21/2017   Procedure: OPEN REDUCTION INTERNAL FIXATION (ORIF) LEFT DISTAL RADIAL FRACTURE;  Surgeon: Sheral Apley, MD;  Location: [facility] OR;  Service: Orthopedics;  Laterality: Left;   RIGHT/LEFT HEART CATH AND CORONARY ANGIOGRAPHY N/A 06/30/2017   Procedure: Right/Left Heart Cath and Coronary Angiography;  Surgeon: Marykay Lex, MD;  Location: [facility] INVASIVE CV LAB:  pRCA 55%, pCx 40%, OM1 45%, mCx 50%, D2 50% - LVEF 25-35%. Moderately elevated LVEDP(26 mmHg with PCWP 16 mmHg).  FICK CO/CI: 4.47/2.48. PA pressures 47/14 mmHg with a mean of 27 mm.   RIGHT/LEFT HEART CATH AND CORONARY ANGIOGRAPHY N/A 01/10/2023   Procedure: RIGHT/LEFT HEART CATH AND CORONARY ANGIOGRAPHY;  Surgeon: Lennette Bihari, MD;  Location: [facility] INVASIVE CV LAB;  Service: Cardiovascular;  Laterality: N/A;   ROTATOR CUFF REPAIR     left shoulder   TEMPORARY PACEMAKER N/A 01/19/2023   Procedure: TEMPORARY PACEMAKER;  Surgeon: Dolores Patty, MD;  Location: [facility] INVASIVE CV LAB;  Service: Cardiovascular;  Laterality: N/A;   TRANSTHORACIC ECHOCARDIOGRAM  06/2017   EF 25 and 30%. GR 1 DD. Mild diastolic dysfunction with elevated LVEDP. Mild valvular disease.   TRANSTHORACIC ECHOCARDIOGRAM  11'18, 6/'19    a) EF remains 30-35%.  Diffuse hypokinesis noted.  Severe LA dilation; b) Improved EF 35-40%.  Diffuse HK.  GR 1 DD.  Moderate TR.  Mild RV dilation.   TRANSTHORACIC ECHOCARDIOGRAM  01/28/2022   Follow-up echo: EF 50 to 55%.  No RWMA.  GRII DD.  Severe LA dilation.  Mild RA dilation with normal PAP and RAP.  Mild to moderate TR (no significant change noted)   Zio patch  08/2022   3-day: Currently NSR (HR range 47-91 with average 64 bpm) 1 F AVB with IVCD/BBB.  Rare isolated PACs (<1%).  Frequent PVCs (6.1% with rare couplets and triplets.  Also bigeminy.  2 atrial runs 8 beats 5 2 seconds.  No sustained arrhythmias.   ZIO Patch  14 d Event Monitor  10/2018   Results as below < 1% PVC.  (Notably improved after starting amiodarone)    Social History:  reports that she quit smoking about 29 years ago. Her smoking use included cigarettes. She has never been exposed to tobacco smoke. She has never used smokeless tobacco. She reports that she does not drink alcohol and does not use drugs.   Allergies  Allergen Reactions   Chlorhexidine Gluconate Rash    Pt refuses CHG due to inflammation and rash   Ezetimibe-Simvastatin Other (See Comments)    REACTION: Muscle aches (side effect)   Lipitor [Atorvastatin]  Other (See Comments)    Leg weakness    Claritin [Loratadine]     Possible cause of nightmares.     Spironolactone     Caution re: elevated potassium/creatinine   Tamiflu [Oseltamivir Phosphate] Other (See Comments)    nightmares   Pravastatin Sodium Other (See Comments)    REACTION: Muscle aches (side effect)   Sulfonamide Derivatives Nausea And Vomiting    Family History  Problem Relation Age of Onset   Heart disease Mother        s/p pacemaker   Diabetes Mother    Heart disease Father    Diabetes Father    Heart disease Brother        MI   Cancer Brother        Lung   Hypertension Other    Colon cancer Neg Hx    Breast cancer Neg Hx     Family history reviewed and not pertinent    Prior to Admission medications   Medication Sig Start Date End Date Taking? Authorizing Provider  acetaminophen (TYLENOL) 325 MG tablet Take 1-2 tablets (325-650 mg total) by mouth every 6 (six) hours as needed. Patient taking differently: Take 325-650 mg by mouth in the morning and at bedtime. 02/28/23   Setzer, Lynnell Jude, PA-C  amiodarone (PACERONE) 200 MG tablet Take 1 tablet (200 mg total) by mouth daily. 05/20/2023   [name], MD  apixaban (ELIQUIS) 2.5 MG TABS tablet Take 1 tablet (2.5 mg total) by mouth 2 (two) times daily. 05/16/23 05/15/24  [name], MD  clopidogrel (PLAVIX) 75 MG tablet Take 1  tablet (75 mg total) by mouth daily. 05/16/23 05/15/24  [name], MD  glucose blood (ACCU-CHEK AVIVA PLUS) test strip USE TO TEST BLOOD SUGAR ONCE DAILY 03/23/23   [name], MD  loperamide (IMODIUM A-D) 2 MG tablet Take 1 tablet (2 mg total) by mouth 4 (four) times daily as needed for diarrhea or loose stools. 06/09/2023   [name], MD  milrinone [facility]) 20 MG/100 ML SOLN infusion Inject 0.0063 mg/min into the vein continuous. 05/01/23   Arrien, York Ram, MD  Multiple Vitamin (MULTIVITAMIN WITH MINERALS) TABS tablet Take 1 tablet by mouth daily. 03/01/23   Setzer, Lynnell Jude, PA-C  ondansetron (ZOFRAN-ODT) 4 MG disintegrating tablet Take 1 tablet (4 mg total) by mouth every 8 (eight) hours as needed for nausea or vomiting. 05/10/23   [name], MD  potassium chloride SA (KLOR-CON M) 20 MEQ tablet Take 1 tablet (20 mEq total) by mouth daily. 05/17/23   Milford, Anderson Malta, FNP  rosuvastatin (CRESTOR) 10 MG tablet Take 1 tablet (10 mg total) by mouth daily. 05/16/23   [name], MD  scopolamine (TRANSDERM-SCOP) 1 MG/3DAYS Place 1 patch (1.5 mg total) onto the skin every 3 (three) days. 06/04/2023   [name], MD  torsemide (DEMADEX) 20 MG tablet Take 2 tablets (40 mg total) by mouth daily. 05/17/23 06/16/23  Jacklynn Ganong, FNP  traMADol (ULTRAM) 50 MG tablet Take 1 tablet (50 mg total) by mouth 2 (two) times daily as needed for severe pain. 05/01/23   Arrien, York Ram, MD  triamcinolone cream (KENALOG) 0.1 % Apply 1 Application topically 2 (two) times daily as needed (for itching). 04/17/23   [name], MD     Objective    Physical Exam: Vitals:   05/28/2023 2230 05/13/2023 2239 05/19/2023 2245  BP:  118/82 106/70  Pulse:  (!) 103 Marland Kitchen)  28  Resp:  (!) 27 (!) 26  Temp:  (!) 96.4 F (35.8 C)   TempSrc:  Axillary   SpO2: 99% 93% 91%    General: appears to be stated age; somnolent Skin: warm, dry, no rash Head:  AT/[place] Mouth:  Oral mucosa membranes  appear moist, normal dentition Neck: supple; trachea midline Heart:  RRR; did not appreciate any M/R/G Lungs: CTAB, did not appreciate any wheezes, rales, or rhonchi Abdomen: + BS; soft, ND, NT Vascular: 2+ pedal pulses b/l; 2+ radial pulses b/l Extremities: 2+ edema in b/l LE', no muscle wasting    Labs on Admission: I have personally reviewed following labs and imaging studies  CBC: Recent Labs  Lab 05/25/2023 2252  WBC 6.5  NEUTROABS 5.6  HGB 11.5*  HCT 35.9*  MCV 82.7  PLT 205   Basic Metabolic Panel: Recent Labs  Lab 06/09/2023 2252  NA 136  K 5.1  CL 91*  CO2 17*  GLUCOSE 188*  BUN 79*  CREATININE 6.95*  CALCIUM 8.9   GFR: Estimated Creatinine Clearance: 5.7 mL/min (A) (by C-G formula based on SCr of 6.95 mg/dL (H)). Liver Function Tests: No results for input(s): "AST", "ALT", "ALKPHOS", "BILITOT", "PROT", "ALBUMIN" in the last 168 hours. No results for input(s): "LIPASE", "AMYLASE" in the last 168 hours. No results for input(s): "AMMONIA" in the last 168 hours. Coagulation Profile: No results for input(s): "INR", "PROTIME" in the last 168 hours. Cardiac Enzymes: No results for input(s): "CKTOTAL", "CKMB", "CKMBINDEX", "TROPONINI" in the last 168 hours. BNP (last 3 results) Recent Labs    06/09/22 1506  PROBNP 4,638*   HbA1C: No results for input(s): "HGBA1C" in the last 72 hours. CBG: Recent Labs  Lab 05/31/2023 2237  GLUCAP 240*   Lipid Profile: No results for input(s): "CHOL", "HDL", "LDLCALC", "TRIG", "CHOLHDL", "LDLDIRECT" in the last 72 hours. Thyroid Function Tests: No results for input(s): "TSH", "T4TOTAL", "FREET4", "T3FREE", "THYROIDAB" in the last 72 hours. Anemia Panel: No results for input(s): "VITAMINB12", "FOLATE", "FERRITIN", "TIBC", "IRON", "RETICCTPCT" in the last 72 hours. Urine analysis:    Component Value Date/Time   COLORURINE YELLOW 04/06/2023 1304   APPEARANCEUR CLEAR 04/06/2023 1304   LABSPEC 1.025 04/06/2023 1304    PHURINE 5.5 04/06/2023 1304   GLUCOSEU NEGATIVE 04/06/2023 1304   HGBUR LARGE (A) 04/06/2023 1304   BILIRUBINUR NEGATIVE 04/06/2023 1304   KETONESUR NEGATIVE 04/06/2023 1304   PROTEINUR 30 (A) 04/06/2023 1304   NITRITE NEGATIVE 04/06/2023 1304   LEUKOCYTESUR SMALL (A) 04/06/2023 1304    Radiological Exams on Admission: DG Chest Portable 1 View  Result Date: 05/21/2023 CLINICAL DATA:  Hypoglycemia EXAM: PORTABLE CHEST 1 VIEW COMPARISON:  04/25/2023 FINDINGS: Cardiac shadow is enlarged. Defibrillator is again seen and stable. Aortic calcifications are noted. Right PICC is noted with the catheter tip at the cavoatrial junction. Mild central vascular congestion is noted. No focal infiltrate or effusion is seen. IMPRESSION: Mild central vascular congestion. Electronically Signed   By: Alcide Clever M.D.   On: 05/28/2023 22:54      Assessment/Plan   Principal Problem:   Acute on chronic systolic heart failure (HCC) Active Problems:   Acute renal failure superimposed on stage 4 chronic kidney disease (HCC)      #) Acute on chronic systolic/diastolic heart failure: In the context of a documented history of chronic combined systolic/diastolic heart failure as well as biventricular systolic heart failure, presentation suggestive of acute exacerbation thereof in the setting of increased shortness of breath, increased work  of breathing, increased pulmonary vascular congestion on chest x-ray,  The patient's husband, who is her medical power of attorney, confirms that the patient is to be comfort care measures only, confirming DNR/DNI, as well as no life-prolonging/life preserving measures.  Husband conveys that at this point, the patient would not want any additional diagnostic evaluation, including refusal of EKG and refusal of any additional blood draws as well as not wanting any additional imaging.   Plan: DNR/DNI with associated order placed.  Comfort care measures only, with comfort care  order set initiated, including as needed atropine solution for excessive secretions, as needed Benadryl for pruritus, as needed Haldol for agitation or delirium, as needed morphine for pain or shortness of breath, as needed Zofran for nausea/vomiting. as needed oral care orders, including prn nursing care orders placed for management of dry mouth.  No routine vital signs.  RN may pronounce death.  No additional lab draws.            #) Acute renal failure symptoms on CKD stage IV: In the context of documented history of CKD 4 associated baseline creatinine range 2-3, presenting serum creatinine found to be 6.95 relative to most recent prior value of 3.08 on 05/17/2023.  Suspect that this interval deterioration in kidney function is on the basis of decline in renal perfusion gradient as consequence of acute on chronic systolic/diastolic heart failure, as above.  It is complicated by anion gap metabolic acidosis.   Plan: Comfort care measures, as above.        DVT prophylaxis: (none; comfort care measures only as expressed by the patient's medical power of attorney) Code Status: DNR/DNI (confirmed by patient's husband) Family Communication: patient's husband, as above.  Disposition Plan: Per Rounding Team Consults called: none;  Admission status: obs     I SPENT GREATER THAN 75  MINUTES IN CLINICAL CARE TIME/MEDICAL DECISION-MAKING IN COMPLETING THIS ADMISSION.      Chaney Born Ricquel Foulk DO [facility]  From 7PM - 7AM   05/23/2023, 12:06 AM

## 2023-06-12 NOTE — ED Notes (Signed)
RN called floor, pt is coming up.

## 2023-06-12 NOTE — ED Notes (Signed)
ED TO INPATIENT HANDOFF REPORT  ED Nurse Name and Phone #: Kathryne Hitch RN   S Name/Age/Gender Natasha Woodard 82 y.o. female Room/Bed: 016C/016C  Code Status   Code Status: DNR  Home/SNF/Other Yet to be decided  Is this baseline? No   Triage Complete: Triage complete  Chief Complaint Acute on chronic systolic heart failure (HCC) [I50.23]  Triage Note Patient arrives with a blood sugar of 52 Glucagon given in route. Patient arrives on NRB at 15L, alerted mental status, with DNR. Patient has a pacemaker. Patient is non-responsive to verbal stimuli. Last BS 69.   Allergies Allergies  Allergen Reactions   Chlorhexidine Gluconate Rash    Pt refuses CHG due to inflammation and rash   Ezetimibe-Simvastatin Other (See Comments)    REACTION: Muscle aches (side effect)   Lipitor [Atorvastatin] Other (See Comments)    Leg weakness    Claritin [Loratadine]     Possible cause of nightmares.     Spironolactone     Caution re: elevated potassium/creatinine   Tamiflu [Oseltamivir Phosphate] Other (See Comments)    nightmares   Pravastatin Sodium Other (See Comments)    REACTION: Muscle aches (side effect)   Sulfonamide Derivatives Nausea And Vomiting    Level of Care/Admitting Diagnosis ED Disposition     ED Disposition  Admit   Condition  --   Comment  Hospital Area: [facility] [[id number]]  Level of Care: Med-Surg [16]  May place patient in observation at [facility] or [facility] if equivalent level of care is available:: No  Covid Evaluation: Asymptomatic - no recent exposure (last 10 days) testing not required  Diagnosis: Acute on chronic systolic heart failure (HCC) [428.23.ICD-9-CM]  Admitting Physician: Angie Fava [[id number]]  Attending Physician: Angie Fava [[id number]]          B Medical/Surgery History Past Medical History:  Diagnosis Date   AICD (automatic cardioverter/defibrillator) present    Calcific tendonitis 2008    Treatment of left leg   Cardiomyopathy (HCC) 06/2017   a) Echo: EF 25-30%. GR 1-2 DD w/ elevated LVEDP. Mild valvular Dz; b) Cardiac MRI 2/'19: frequent PVCs (diffiuclt to interpret) - EF ~27% w/ diffuse HK.  No evidence of infarct, infiltrative Dz or myocarditis. -- ? if related to PVCs. c) f/u Echo 6/'19: EF 35-40%. Gr 1 DD. Diffuse HK.-> d) Jan 2020 EF 50 to 55%.  Mod MAC, mod LAE.  Ao Sclerosis; e) Echo 1/'21 - EF 60-65%, Gr II DD. Mild Mod MR.    Chronic combined systolic and diastolic CHF, NYHA class 2 and ACA/AHA stage C    Now essentially resolved, back to simply diastolic CHF   Coronary artery disease, non-occlusive    mild-moderate CAD 06/30/17 cath   CTS (carpal tunnel syndrome)    Diabetes mellitus    Type II   Dysrhythmia    patient said that she cant remember what it is   Frequent unifocal PVCs 01/2018   Event Monitor: NSR, W/ frequent multifocal PVCs (9%-down from 30% prior to amiodarone) and PACs.  Nighttime bradycardia suggestive of OSA.';  Zio patch September 2023: PVC burden now 6.1%.   Hiatal hernia    with reflux   Hyperlipidemia    Statin intolerant   Hypertension    Ichthyosis congenita    Intermittent claudication of both lower extremities due to atherosclerosis (HCC) 08/22/2021   LEA Dopplers 09/09/2021:  Right: Total occlusion noted in the superficial femoral artery. Atherosclerosis noted throughout extremity, see  note above. Three vessel runoff.  Left: Total occlusion noted in the superficial femoral artery and/or popliteal artery. Total occlusion noted in the distal anterior tibial artery. Athereosclerosis noted throughout extremity.    NSVD (normal spontaneous vaginal delivery) 1971 & 1972   Obesity    Plantar fasciitis    Left   Presence of permanent cardiac pacemaker    Pulmonary nodule    Imaged multiple times and benign appearing   Sleep apnea    Wears glasses    Past Surgical History:  Procedure Laterality Date   ABDOMINAL HYSTERECTOMY  1975    TAH-BSO   ABSCESS DRAINAGE  1972   right breast   APPLICATION OF WOUND VAC Right 01/15/2023   Procedure: APPLICATION OF INCISIONAL WOUND VAC;  Surgeon: Victorino Sparrow, MD;  Location: [facility] OR;  Service: Vascular;  Laterality: Right;   APPLICATION OF WOUND VAC Right 01/26/2023   Procedure: APPLICATION OF WOUND VAC RIGHT GROIN WASHOUT AND SARTORIUS MUSCLE FLAP;  Surgeon: Victorino Sparrow, MD;  Location: [facility] OR;  Service: Vascular;  Laterality: Right;   ARTERY REPAIR Right 01/15/2023   Procedure: RIGHT PROFUNDA ARTERY REPAIR WITH 2000G MYRIAD MORCELLS;  Surgeon: Victorino Sparrow, MD;  Location: [facility] OR;  Service: Vascular;  Laterality: Right;   BREAST BIOPSY Left 01/14/2009   Stereo- Benign   CARDIAC MRI  01/2018   Difficult to interpret 2/2 PVCs. Normal LV size - EF ~27% with diffuse HK. Mild RV dilation - normal fxn. -- NO MYOCARDIAL LGI => no definitive evidence of prior MI, Infiltrative Dz or Myocarditis -- suspect NICM, possibly related to PVCs.   CARPAL TUNNEL RELEASE Right 07/02/2014   Procedure: RIGHT CARPAL TUNNEL RELEASE;  Surgeon: Nicki Reaper, MD;  Location: [facility];  Service: Orthopedics;  Laterality: Right;   CARPAL TUNNEL RELEASE Left 06/11/2015   Procedure: LEFT CARPAL TUNNEL RELEASE;  Surgeon: Cindee Salt, MD;  Location: [facility];  Service: Orthopedics;  Laterality: Left;  REGIONAL/FAB   COLONOSCOPY     corn removal  1968   right   CORONARY STENT INTERVENTION N/A 01/10/2023   Procedure: CORONARY STENT INTERVENTION;  Surgeon: Lennette Bihari, MD;  Location: [facility] INVASIVE CV LAB;  Service: Cardiovascular;  Laterality: N/A;   GROIN DEBRIDEMENT Right 02/06/2023   Procedure: RIGHT GROIN DEBRIDEMENT WITH WOUND VAC CHANGE;  Surgeon: Victorino Sparrow, MD;  Location: [facility] OR;  Service: Vascular;  Laterality: Right;   HEMATOMA EVACUATION Right 01/15/2023   Procedure: EVACUATION HEMATOMA RIGHT GROIN;  Surgeon: Victorino Sparrow, MD;  Location: [facility] OR;  Service: Vascular;   Laterality: Right;   Holter Monitor  06/2017   ~17,000 PVC beats - majority were singlets, some couplets. 44 brief 3-7 beat runs of NSVT. Also noted were less frequent PACs with 11 runs (longest 15 beats)   IABP INSERTION Right 01/10/2023   Procedure: IABP Insertion;  Surgeon: Lennette Bihari, MD;  Location: [facility] INVASIVE CV LAB;  Service: Cardiovascular;  Laterality: Right;   ICD IMPLANT N/A 01/23/2023   Procedure: ICD IMPLANT;  Surgeon: Marinus Maw, MD;  Location: [facility] INVASIVE CV LAB;  Service: Cardiovascular;  Laterality: N/A;   INCISION AND DRAINAGE OF WOUND Right 01/31/2023   Procedure: IRRIGATION AND DEBRIDEMENT RIGHT GROIN WOUND AND WOUND VAC CHANGE;  Surgeon: Victorino Sparrow, MD;  Location: [facility] OR;  Service: Vascular;  Laterality: Right;   OPEN REDUCTION INTERNAL FIXATION (ORIF) DISTAL RADIAL FRACTURE Left 07/21/2017   Procedure: OPEN REDUCTION INTERNAL FIXATION (  ORIF) LEFT DISTAL RADIAL FRACTURE;  Surgeon: Sheral Apley, MD;  Location: [facility] OR;  Service: Orthopedics;  Laterality: Left;   RIGHT/LEFT HEART CATH AND CORONARY ANGIOGRAPHY N/A 06/30/2017   Procedure: Right/Left Heart Cath and Coronary Angiography;  Surgeon: Marykay Lex, MD;  Location: [facility] INVASIVE CV LAB:  pRCA 55%, pCx 40%, OM1 45%, mCx 50%, D2 50% - LVEF 25-35%. Moderately elevated LVEDP(26 mmHg with PCWP 16 mmHg).  FICK CO/CI: 4.47/2.48. PA pressures 47/14 mmHg with a mean of 27 mm.   RIGHT/LEFT HEART CATH AND CORONARY ANGIOGRAPHY N/A 01/10/2023   Procedure: RIGHT/LEFT HEART CATH AND CORONARY ANGIOGRAPHY;  Surgeon: Lennette Bihari, MD;  Location: [facility] INVASIVE CV LAB;  Service: Cardiovascular;  Laterality: N/A;   ROTATOR CUFF REPAIR     left shoulder   TEMPORARY PACEMAKER N/A 01/19/2023   Procedure: TEMPORARY PACEMAKER;  Surgeon: Dolores Patty, MD;  Location: [facility] INVASIVE CV LAB;  Service: Cardiovascular;  Laterality: N/A;   TRANSTHORACIC ECHOCARDIOGRAM  06/2017   EF 25 and 30%. GR 1 DD. Mild diastolic dysfunction with  elevated LVEDP. Mild valvular disease.   TRANSTHORACIC ECHOCARDIOGRAM  11'18, 6/'19    a) EF remains 30-35%.  Diffuse hypokinesis noted.  Severe LA dilation; b) Improved EF 35-40%.  Diffuse HK.  GR 1 DD.  Moderate TR.  Mild RV dilation.   TRANSTHORACIC ECHOCARDIOGRAM  01/28/2022   Follow-up echo: EF 50 to 55%.  No RWMA.  GRII DD.  Severe LA dilation.  Mild RA dilation with normal PAP and RAP.  Mild to moderate TR (no significant change noted)   Zio patch  08/2022   3-day: Currently NSR (HR range 47-91 with average 64 bpm) 1 F AVB with IVCD/BBB.  Rare isolated PACs (<1%).  Frequent PVCs (6.1% with rare couplets and triplets.  Also bigeminy.  2 atrial runs 8 beats 5 2 seconds.  No sustained arrhythmias.   ZIO Patch 14 d Event Monitor  10/2018   Results as below < 1% PVC.  (Notably improved after starting amiodarone)     A IV Location/Drains/Wounds Patient Lines/Drains/Airways Status     Active Line/Drains/Airways     Name Placement date Placement time Site Days   Peripheral IV 05/21/2023 18 G Anterior;Right External jugular 06/07/2023  2242  External jugular  1   PICC Single Lumen 05/08/23 Right Brachial 37 cm 0 cm 05/08/23  1750  Brachial  22            Intake/Output Last 24 hours No intake or output data in the 24 hours ending 05/21/2023 0054  Labs/Imaging Results for orders placed or performed during the hospital encounter of 06/07/2023 (from the past 48 hour(s))  CBG monitoring, ED     Status: Abnormal   Collection Time: 05/20/2023 10:37 PM  Result Value Ref Range   Glucose-Capillary 240 (H) 70 - 99 mg/dL    Comment: Glucose reference range applies only to samples taken after fasting for at least 8 hours.  CBC with Differential     Status: Abnormal   Collection Time: 05/20/2023 10:52 PM  Result Value Ref Range   WBC 6.5 4.0 - 10.5 K/uL   RBC 4.34 3.87 - 5.11 MIL/uL   Hemoglobin 11.5 (L) 12.0 - 15.0 g/dL   HCT 16.1 (L) 09.6 - 04.5 %   MCV 82.7 80.0 - 100.0 fL   MCH 26.5 26.0 - 34.0  pg   MCHC 32.0 30.0 - 36.0 g/dL   RDW 40.9 (H) 81.1 - 91.4 %  Platelets 205 150 - 400 K/uL   nRBC 1.5 (H) 0.0 - 0.2 %   Neutrophils Relative % 87 %   Neutro Abs 5.6 1.7 - 7.7 K/uL   Lymphocytes Relative 3 %   Lymphs Abs 0.2 (L) 0.7 - 4.0 K/uL   Monocytes Relative 9 %   Monocytes Absolute 0.6 0.1 - 1.0 K/uL   Eosinophils Relative 0 %   Eosinophils Absolute 0.0 0.0 - 0.5 K/uL   Basophils Relative 0 %   Basophils Absolute 0.0 0.0 - 0.1 K/uL   Immature Granulocytes 1 %   Abs Immature Granulocytes 0.07 0.00 - 0.07 K/uL    Comment: Performed at [facility], [address] [place], [place] [address]  Basic metabolic panel     Status: Abnormal   Collection Time: 05/22/2023 10:52 PM  Result Value Ref Range   Sodium 136 135 - 145 mmol/L   Potassium 5.1 3.5 - 5.1 mmol/L   Chloride 91 (L) 98 - 111 mmol/L   CO2 17 (L) 22 - 32 mmol/L   Glucose, Bld 188 (H) 70 - 99 mg/dL    Comment: Glucose reference range applies only to samples taken after fasting for at least 8 hours.   BUN 79 (H) 8 - 23 mg/dL   Creatinine, Ser 8.46 (H) 0.44 - 1.00 mg/dL   Calcium 8.9 8.9 - 96.2 mg/dL   GFR, Estimated 5 (L) >60 mL/min    Comment: (NOTE) Calculated using the CKD-EPI Creatinine Equation (2021)    Anion gap 28 (H) 5 - 15    Comment: ELECTROLYTES REPEATED TO VERIFY Performed at [facility], [address] [place], [place] [address]   CBG monitoring, ED     Status: Abnormal   Collection Time: 05/21/2023 12:51 AM  Result Value Ref Range   Glucose-Capillary 103 (H) 70 - 99 mg/dL    Comment: Glucose reference range applies only to samples taken after fasting for at least 8 hours.   *Note: Due to a large number of results and/or encounters for the requested time period, some results have not been displayed. A complete set of results can be found in Results Review.   DG Chest Portable 1 View  Result Date: 06/01/2023 CLINICAL DATA:  Hypoglycemia EXAM: PORTABLE CHEST 1 VIEW COMPARISON:   04/25/2023 FINDINGS: Cardiac shadow is enlarged. Defibrillator is again seen and stable. Aortic calcifications are noted. Right PICC is noted with the catheter tip at the cavoatrial junction. Mild central vascular congestion is noted. No focal infiltrate or effusion is seen. IMPRESSION: Mild central vascular congestion. Electronically Signed   By: Alcide Clever M.D.   On: 05/28/2023 22:54    Pending Labs Unresulted Labs (From admission, onward)    None       Vitals/Pain Today's Vitals   05/27/2023 2230 05/20/2023 2239 05/27/2023 2245 05/24/2023 2300  BP:  118/82 106/70 117/66  Pulse:  (!) 103 (!) 28   Resp:  (!) 27 (!) 26 (!) 27  Temp:  (!) 96.4 F (35.8 C)    TempSrc:  Axillary    SpO2: 99% 93% 91%     Isolation Precautions No active isolations  Medications Medications  acetaminophen (TYLENOL) tablet 650 mg (has no administration in time range)    Or  acetaminophen (TYLENOL) suppository 650 mg (has no administration in time range)  morphine (PF) 2 MG/ML injection 2 mg (has no administration in time range)  haloperidol (HALDOL) tablet 0.5 mg (has no administration in time range)  Or  haloperidol (HALDOL) 2 MG/ML solution 0.5 mg (has no administration in time range)    Or  haloperidol lactate (HALDOL) injection 5 mg (has no administration in time range)  diphenhydrAMINE (BENADRYL) injection 12.5 mg (has no administration in time range)  ondansetron (ZOFRAN-ODT) disintegrating tablet 4 mg (has no administration in time range)    Or  ondansetron (ZOFRAN) injection 4 mg (has no administration in time range)  atropine 1 % ophthalmic solution 4 drop (has no administration in time range)  antiseptic oral rinse (BIOTENE) solution 15 mL (has no administration in time range)  polyvinyl alcohol (LIQUIFILM TEARS) 1.4 % ophthalmic solution 1 drop (has no administration in time range)  dextrose 50 % solution 50 mL (50 mLs Intravenous Given 05/17/2023 2236)  glucagon (human recombinant) (GLUCAGEN)  injection 1 mg (1 mg Intravenous Given 06/01/2023 2235)    Mobility non-ambulatory     Focused Assessments Neuro Assessment Handoff:  Swallow screen pass?  Not needed         Neuro Assessment:   Neuro Checks:      Has TPA been given? No If patient is a Neuro Trauma and patient is going to OR before floor call report to 4N Charge nurse: [phone] or [phone]   R Recommendations: See Admitting Provider Note  Report given to:   Additional Notes: Pt last BG was 109.

## 2023-06-12 NOTE — Progress Notes (Signed)
Chaplain accompanied physician to speak with family confirming they wanted the medical team to follow the DNR and also to not intubate the patient.  Pt's husband, daughter and sister were in the family consult room. The daughter confirmed no extraordinary measures were her mother's wishes, preferring instead to be kept comfortable at this point in her life.  Chaplain escorted family members to pt's bedside.  Chaplain provided ministry of hospitality by bringing beverages to the room.  Vernell Morgans Chaplain

## 2023-06-12 NOTE — Progress Notes (Signed)
   05/20/2023 1400  Spiritual Encounters  Type of Visit Initial  Care provided to: Pt and family  Conversation partners present during encounter Nurse  Referral source Family  Reason for visit End-of-life  OnCall Visit No  Spiritual Framework  Presenting Themes Meaning/purpose/sources of inspiration  Values/beliefs Faith  Community/Connection Family  Family Stress Factors Loss (anticipatory grief)  Interventions  Spiritual Care Interventions Made Compassionate presence;Reflective listening;Normalization of emotions;Meaning making;Prayer  Intervention Outcomes  Outcomes Reduced anxiety   Ch responded to page for end-of-life. Pt's family was at bedside. Family is going through anticipatory grief. Ch encouraged storytelling and helped family process their thoughts. No follow-up needed at this time.

## 2023-06-12 NOTE — Telephone Encounter (Signed)
Hospice Referral received and is currently being processed. Per the referral notes

## 2023-06-12 DEATH — deceased

## 2023-06-28 ENCOUNTER — Other Ambulatory Visit: Payer: Medicare Other

## 2023-07-03 ENCOUNTER — Ambulatory Visit: Payer: Medicare Other | Admitting: Dietician

## 2023-08-07 ENCOUNTER — Encounter: Payer: Medicare Other | Admitting: Internal Medicine

## 2023-12-11 ENCOUNTER — Ambulatory Visit: Payer: Medicare Other | Admitting: Cardiology
